# Patient Record
Sex: Male | Born: 1955
Health system: Southern US, Community
[De-identification: ages and names within clinical notes are randomized; demographics above are authoritative.]

## PROBLEM LIST (undated history)

## (undated) DIAGNOSIS — F1011 Alcohol abuse, in remission: Secondary | ICD-10-CM

## (undated) DIAGNOSIS — I219 Acute myocardial infarction, unspecified: Secondary | ICD-10-CM

## (undated) DIAGNOSIS — I10 Essential (primary) hypertension: Secondary | ICD-10-CM

## (undated) DIAGNOSIS — Z992 Dependence on renal dialysis: Secondary | ICD-10-CM

## (undated) DIAGNOSIS — I429 Cardiomyopathy, unspecified: Secondary | ICD-10-CM

## (undated) DIAGNOSIS — I509 Heart failure, unspecified: Secondary | ICD-10-CM

## (undated) DIAGNOSIS — N186 End stage renal disease: Secondary | ICD-10-CM

## (undated) DIAGNOSIS — I499 Cardiac arrhythmia, unspecified: Secondary | ICD-10-CM

## (undated) DIAGNOSIS — I2510 Atherosclerotic heart disease of native coronary artery without angina pectoris: Secondary | ICD-10-CM

## (undated) DIAGNOSIS — I469 Cardiac arrest, cause unspecified: Secondary | ICD-10-CM

## (undated) DIAGNOSIS — I5042 Chronic combined systolic (congestive) and diastolic (congestive) heart failure: Secondary | ICD-10-CM

## (undated) HISTORY — DX: Cardiomyopathy, unspecified: I42.9

## (undated) HISTORY — DX: Chronic combined systolic (congestive) and diastolic (congestive) heart failure: I50.42

## (undated) HISTORY — DX: Alcohol abuse, in remission: F10.11

---

## 1978-10-27 HISTORY — PX: ORTHOPEDIC SURGERY: SHX850

## 1981-10-27 HISTORY — PX: KNEE SURGERY: SHX244

## 1990-10-27 HISTORY — PX: HAND SURGERY: SHX662

## 2006-07-30 ENCOUNTER — Emergency Department: Admit: 2006-07-30 | Payer: Self-pay | Source: Emergency Department | Admitting: Emergency Medicine

## 2006-10-18 ENCOUNTER — Emergency Department: Admit: 2006-10-18 | Payer: Self-pay | Source: Emergency Department

## 2008-10-27 HISTORY — PX: COLONOSCOPY, DIAGNOSTIC (SCREENING): SHX174

## 2011-09-21 ENCOUNTER — Emergency Department (HOSPITAL_COMMUNITY): Payer: BC Managed Care – PPO

## 2011-09-21 ENCOUNTER — Other Ambulatory Visit: Payer: Self-pay

## 2011-09-21 ENCOUNTER — Inpatient Hospital Stay (HOSPITAL_COMMUNITY)
Admission: EM | Admit: 2011-09-21 | Discharge: 2011-09-30 | DRG: 569 | Disposition: A | Discharge status: Home / Self Care | Payer: BC Managed Care – PPO | Source: Ambulatory Visit | Attending: Internal Medicine | Admitting: Internal Medicine

## 2011-09-21 DIAGNOSIS — Z87891 Personal history of nicotine dependence: Secondary | ICD-10-CM

## 2011-09-21 DIAGNOSIS — I161 Hypertensive emergency: Secondary | ICD-10-CM

## 2011-09-21 DIAGNOSIS — F10931 Alcohol use, unspecified with withdrawal delirium: Secondary | ICD-10-CM | POA: Diagnosis not present

## 2011-09-21 DIAGNOSIS — N269 Renal sclerosis, unspecified: Secondary | ICD-10-CM | POA: Diagnosis present

## 2011-09-21 DIAGNOSIS — N289 Disorder of kidney and ureter, unspecified: Secondary | ICD-10-CM

## 2011-09-21 DIAGNOSIS — N179 Acute kidney failure, unspecified: Secondary | ICD-10-CM | POA: Diagnosis present

## 2011-09-21 DIAGNOSIS — N184 Chronic kidney disease, stage 4 (severe): Secondary | ICD-10-CM | POA: Diagnosis present

## 2011-09-21 DIAGNOSIS — I5043 Acute on chronic combined systolic (congestive) and diastolic (congestive) heart failure: Secondary | ICD-10-CM

## 2011-09-21 DIAGNOSIS — I428 Other cardiomyopathies: Secondary | ICD-10-CM | POA: Diagnosis present

## 2011-09-21 DIAGNOSIS — F1020 Alcohol dependence, uncomplicated: Secondary | ICD-10-CM | POA: Diagnosis present

## 2011-09-21 DIAGNOSIS — I129 Hypertensive chronic kidney disease with stage 1 through stage 4 chronic kidney disease, or unspecified chronic kidney disease: Principal | ICD-10-CM | POA: Diagnosis present

## 2011-09-21 DIAGNOSIS — E876 Hypokalemia: Secondary | ICD-10-CM | POA: Diagnosis present

## 2011-09-21 DIAGNOSIS — Z23 Encounter for immunization: Secondary | ICD-10-CM

## 2011-09-21 DIAGNOSIS — Z6832 Body mass index (BMI) 32.0-32.9, adult: Secondary | ICD-10-CM

## 2011-09-21 DIAGNOSIS — F10231 Alcohol dependence with withdrawal delirium: Secondary | ICD-10-CM | POA: Diagnosis not present

## 2011-09-21 DIAGNOSIS — R809 Proteinuria, unspecified: Secondary | ICD-10-CM | POA: Diagnosis present

## 2011-09-21 DIAGNOSIS — I5032 Chronic diastolic (congestive) heart failure: Secondary | ICD-10-CM | POA: Diagnosis present

## 2011-09-21 DIAGNOSIS — I509 Heart failure, unspecified: Secondary | ICD-10-CM | POA: Diagnosis present

## 2011-09-21 HISTORY — DX: Essential (primary) hypertension: I10

## 2011-09-21 LAB — POCT I-STAT, CHEM 8
Calcium, Ion: 1.15 mmol/L (ref 1.12–1.32)
Creatinine, Ser: 4.9 mg/dL — ABNORMAL HIGH (ref 0.50–1.35)
Glucose, Bld: 103 mg/dL — ABNORMAL HIGH (ref 70–99)
HCT: 43 % (ref 39.0–52.0)
Hemoglobin: 14.6 g/dL (ref 13.0–17.0)

## 2011-09-21 LAB — CREATININE, SERUM
Creatinine, Ser: 4.72 mg/dL — ABNORMAL HIGH (ref 0.50–1.35)
GFR calc Af Amer: 14 mL/min — ABNORMAL LOW (ref >90)
GFR calc non Af Amer: 13 mL/min — ABNORMAL LOW (ref >90)

## 2011-09-21 LAB — URINALYSIS, ROUTINE W REFLEX MICROSCOPIC
Ketones, ur: NEGATIVE mg/dL
Nitrite: NEGATIVE
Protein, ur: >300 mg/dL — AB
Specific Gravity, Urine: 1.016 (ref 1.005–1.030)
Urobilinogen, UA: 1 mg/dL (ref 0.0–1.0)

## 2011-09-21 LAB — CBC
HCT: 35.9 % — ABNORMAL LOW (ref 39.0–52.0)
Hemoglobin: 12.5 g/dL — ABNORMAL LOW (ref 13.0–17.0)
MCH: 30.9 pg (ref 26.0–34.0)
MCH: 30.6 pg (ref 26.0–34.0)
MCHC: 34.8 g/dL (ref 30.0–36.0)
MCV: 87.8 fL (ref 78.0–100.0)
Platelets: 240 10*3/uL (ref 150–400)
Platelets: 221 10*3/uL (ref 150–400)
RBC: 4.24 MIL/uL (ref 4.22–5.81)
RBC: 4.09 MIL/uL — ABNORMAL LOW (ref 4.22–5.81)
RDW: 15.8 % — ABNORMAL HIGH (ref 11.5–15.5)
RDW: 15.7 % — ABNORMAL HIGH (ref 11.5–15.5)
WBC: 5.5 10*3/uL (ref 4.0–10.5)

## 2011-09-21 LAB — PRO B NATRIURETIC PEPTIDE: Pro B Natriuretic peptide (BNP): 30315 pg/mL — ABNORMAL HIGH (ref 0–125)

## 2011-09-21 LAB — DIFFERENTIAL
Basophils Absolute: 0.1 10*3/uL (ref 0.0–0.1)
Basophils Relative: 1 % (ref 0–1)
Eosinophils Absolute: 0.2 10*3/uL (ref 0.0–0.7)
Lymphs Abs: 1.5 10*3/uL (ref 0.7–4.0)
Neutrophils Relative %: 60 % (ref 43–77)

## 2011-09-21 LAB — URINE MICROSCOPIC-ADD ON

## 2011-09-21 LAB — MRSA PCR SCREENING: MRSA by PCR: NEGATIVE

## 2011-09-21 LAB — TROPONIN I: Troponin I: <0.3 ng/mL (ref <0.30)

## 2011-09-21 MED ORDER — LABETALOL HCL 5 MG/ML IV SOLN
20.0000 mg | INTRAVENOUS | Status: DC | PRN
  Administered 2011-09-21 – 2011-09-22 (×3): 20 mg via INTRAVENOUS
  Filled 2011-09-21 (×5): qty 4

## 2011-09-21 MED ORDER — FUROSEMIDE 10 MG/ML IJ SOLN
80.0000 mg | Freq: Two times a day (BID) | INTRAMUSCULAR | Status: DC
  Filled 2011-09-21 (×2): qty 8

## 2011-09-21 MED ORDER — INFLUENZA VIRUS VACC SPLIT PF IM SUSP
0.5000 mL | INTRAMUSCULAR | Status: AC
  Administered 2011-09-22: 0.5 mL via INTRAMUSCULAR
  Filled 2011-09-21: qty 0.5

## 2011-09-21 MED ORDER — ONDANSETRON HCL 4 MG/2ML IJ SOLN: 4.0000 mg | Freq: Four times a day (QID) | INTRAMUSCULAR | Status: DC | PRN

## 2011-09-21 MED ORDER — ACETAMINOPHEN 325 MG PO TABS: 650.0000 mg | ORAL_TABLET | ORAL | Status: DC | PRN

## 2011-09-21 MED ORDER — ZOLPIDEM TARTRATE 5 MG PO TABS
10.0000 mg | ORAL_TABLET | Freq: Every evening | ORAL | Status: DC | PRN
  Administered 2011-09-25: 10 mg via ORAL
  Filled 2011-09-21: qty 2

## 2011-09-21 MED ORDER — LABETALOL HCL 5 MG/ML IV SOLN
20.0000 mg | Freq: Once | INTRAVENOUS | Status: AC
  Administered 2011-09-21: 20 mg via INTRAVENOUS
  Filled 2011-09-21: qty 4

## 2011-09-21 MED ORDER — SODIUM CHLORIDE 0.9 % IV SOLN: 250.0000 mL | INTRAVENOUS | Status: DC

## 2011-09-21 MED ORDER — CARVEDILOL 3.125 MG PO TABS
3.1250 mg | ORAL_TABLET | Freq: Two times a day (BID) | ORAL | Status: DC
  Administered 2011-09-21 – 2011-09-22 (×2): 3.125 mg via ORAL
  Filled 2011-09-21 (×4): qty 1

## 2011-09-21 MED ORDER — SODIUM CHLORIDE 0.9 % IJ SOLN
3.0000 mL | Freq: Two times a day (BID) | INTRAMUSCULAR | Status: DC
  Administered 2011-09-21 – 2011-09-27 (×9): 3 mL via INTRAVENOUS

## 2011-09-21 MED ORDER — LABETALOL HCL 5 MG/ML IV SOLN
1.0000 mg/min | INTRAVENOUS | Status: DC
  Filled 2011-09-21: qty 100

## 2011-09-21 MED ORDER — FUROSEMIDE 10 MG/ML IJ SOLN
40.0000 mg | Freq: Once | INTRAMUSCULAR | Status: AC
  Administered 2011-09-21: 40 mg via INTRAVENOUS
  Filled 2011-09-21: qty 4

## 2011-09-21 MED ORDER — NITROGLYCERIN 2 % TD OINT
1.0000 [in_us] | TOPICAL_OINTMENT | Freq: Three times a day (TID) | TRANSDERMAL | Status: AC
  Administered 2011-09-21 – 2011-09-22 (×3): 1 [in_us] via TOPICAL
  Filled 2011-09-21: qty 30

## 2011-09-21 MED ORDER — SODIUM CHLORIDE 0.9 % IJ SOLN: 3.0000 mL | INTRAMUSCULAR | Status: DC | PRN

## 2011-09-21 MED ORDER — ENOXAPARIN SODIUM 30 MG/0.3ML ~~LOC~~ SOLN
30.0000 mg | SUBCUTANEOUS | Status: DC
  Administered 2011-09-21 – 2011-09-29 (×9): 30 mg via SUBCUTANEOUS
  Filled 2011-09-21 (×10): qty 0.3

## 2011-09-21 NOTE — ED Provider Notes (Signed)
History     CSN: [id number] Arrival date & time: 09/21/2011  4:19 PM   First MD Initiated Contact with Patient 09/21/11 1624      Chief Complaint  Patient presents with  . Shortness of Breath  . Hypertension    (Consider location/radiation/quality/duration/timing/severity/associated sxs/prior treatment) Patient is a 55 y.o. male presenting with shortness of breath and hypertension. The history is provided by the patient.  Shortness of Breath  The current episode started more than 2 weeks ago. Associated symptoms include shortness of breath. Pertinent negatives include no chest pain.  Hypertension Associated symptoms include shortness of breath. Pertinent negatives include no chest pain and no abdominal pain.   patient states he's had trouble breathing for the last month or 2. Worse with lying down. No chest pain. No headaches. He has no primary care doctor. He was seen at urgent care in [facility] and was sent here because his blood pressure was 230/180. No numbness or weakness. He has a history of hypertension. He is a former smoker. For shortness of breath is better with sitting up. Recently exertion. He also is swelling in his bilateral lower legs, she states is new.  Past Medical History  Diagnosis Date  . CHF (congestive heart failure)   . Hypertension     History reviewed. No pertinent past surgical history.  History reviewed. No pertinent family history.  History  Substance Use Topics  . Smoking status: Former Research scientist (life sciences)  . Smokeless tobacco: Not on file  . Alcohol Use: Yes     occ      Review of Systems  Constitutional: Positive for fatigue. Negative for appetite change.  HENT: Negative for ear discharge.   Respiratory: Positive for shortness of breath. Negative for chest tightness.   Cardiovascular: Positive for leg swelling. Negative for chest pain.  Gastrointestinal: Negative for abdominal pain.  Genitourinary: Negative for flank pain.  Musculoskeletal: Negative  for back pain.  Neurological: Negative for seizures and numbness.    Allergies  Review of patient's allergies indicates no known allergies.  Home Medications   Current Outpatient Rx  Name Route Sig Dispense Refill  . GUAIFENESIN 600 MG PO TB12 Oral Take 1,200 mg by mouth 2 (two) times daily as needed. For chest congestion     . VICKS DAYQUIL SINEX PO Oral Take 1 capsule by mouth daily as needed. For congesiton       BP 189/125  Pulse 86  Temp(Src) 97.6 F (36.4 C) (Oral)  Resp 26  SpO2 97%  Physical Exam  Nursing note and vitals reviewed. Constitutional: He is oriented to person, place, and time. He appears well-developed and well-nourished.  HENT:  Head: Normocephalic and atraumatic.  Eyes: EOM are normal. Pupils are equal, round, and reactive to light.  Neck: Normal range of motion. Neck supple.  Cardiovascular: Normal rate, regular rhythm and normal heart sounds.   No murmur heard.      Severely hypertensive  Pulmonary/Chest: Effort normal.       Mild wheezes bilateral bases  Abdominal: Soft. Bowel sounds are normal. He exhibits no distension and no mass. There is no tenderness. There is no rebound and no guarding.  Musculoskeletal: Normal range of motion. He exhibits edema.       Bilateral lower extremity pitting edema.  Neurological: He is alert and oriented to person, place, and time. No cranial nerve deficit.  Skin: Skin is warm and dry.  Psychiatric: He has a normal mood and affect.    ED Course  Procedures (including critical care time)  Labs Reviewed  CBC - Abnormal; Notable for the following:    HCT 37.3 (*)    RDW 15.8 (*)    All other components within normal limits  DIFFERENTIAL - Abnormal; Notable for the following:    Monocytes Relative 14 (*)    All other components within normal limits  PRO B NATRIURETIC PEPTIDE - Abnormal; Notable for the following:    BNP, POC 30315.0 (*)    All other components within normal limits  POCT I-STAT, CHEM 8 -  Abnormal; Notable for the following:    BUN 55 (*)    Creatinine, Ser 4.90 (*)    Glucose, Bld 103 (*)    All other components within normal limits  TROPONIN I  I-STAT, CHEM 8  URINALYSIS, ROUTINE W REFLEX MICROSCOPIC   Dg Chest 2 View  09/21/2011  *RADIOLOGY REPORT*  Clinical Data: Short of breath.  Hypertension.  CHF.  CHEST - 2 VIEW  Comparison: None.  Findings: Cardiomegaly.  Interstitial pulmonary edema.  Pulmonary vascular congestion.  Small bilateral pleural effusions.  Thickening of the fissures is present on the lateral view.  IMPRESSION: Mild CHF.  Original Report Authenticated By: Dereck Ligas, M.D.     1. Hypertensive emergency   2. Renal insufficiency   3. CHF (congestive heart failure)      Date: 09/21/2011  Rate: 111  Rhythm: sinus tachycardia  QRS Axis: right  Intervals: normal  ST/T Wave abnormalities: q waves anteriorly  Conduction Disutrbances:none  Narrative Interpretation: repolarization abnormalites  Old EKG Reviewed: none available  CRITICAL CARE Performed by: Mackie Pai   Total critical care time: 30  Critical care time was exclusive of separately billable procedures and treating other patients.  Critical care was necessary to treat or prevent imminent or life-threatening deterioration.  Critical care was time spent personally by me on the following activities: development of treatment plan with patient and/or surrogate as well as nursing, discussions with consultants, evaluation of patient's response to treatment, examination of patient, obtaining history from patient or surrogate, ordering and performing treatments and interventions, ordering and review of laboratory studies, ordering and review of radiographic studies, pulse oximetry and re-evaluation of patient's condition.  MDM  Patient presents with high blood pressure and dyspnea. He has a very elevated blood pressure initially 2:30 or 170. No chest pain. He states he's had trouble  sleeping when lying down. No chest pain. He has new renal failure and new CHF. This makes a hypertensive emergency. He does not appear to need urgent dialysis. He has had improvement with IV labetalol. He'll be admitted to step down unit.      Jasper Riling. Alvino Chapel, MD 09/21/11 1907

## 2011-09-21 NOTE — ED Notes (Signed)
Admission MD at bedside.  

## 2011-09-21 NOTE — ED Notes (Signed)
Patient is resting comfortably. 

## 2011-09-21 NOTE — ED Notes (Signed)
Per ems, pt transferred from Urgent care on [place] pt reporting SOB. BP was 230/170 in bilateral upper extremeties. Denies chest pain, headaches, blurred vision. Equal grips, no drift. Breath sounds clear to auscultation. 12 lead was negative for EMS and urgent care. HR: 114, Sats: 97% on 4L [place].

## 2011-09-21 NOTE — ED Notes (Signed)
Pt has a urinal and knows we need a  Urine sample

## 2011-09-21 NOTE — H&P (Signed)
Chief Complaint: Shortness of breath HPI: Kenneth [name] is an 55 y.o. male with a past medical history significant for hypertension for 15 years that has been untreated presents today with acute shortness of breath.  The patient notes that he has been short of breath for at least a week and it has steadily progressed to SOB at rest worsened with exertion.  The patient has noted that there have been no changes in his urinary frequency and that while he knew he had hypertension for 15 years he has only used bp meds periodically.  He notes no headache but does have PND and orthopnea.  He has had progressive Lower extremity edema bilaterally for the past 2 weeks.  He has had no fevers, chills, night sweats, nasuea, vomiting, diarrhea, chest pain, palpitations but has had the above noted shortness of breath.  He has had no pain with this episode.  In the ED patient has a BNP of 57846 and had 40 mg of IV lasix as well as a 1 time dose of 20 mg of labetalol.  PMH:  Past Medical History  Diagnosis Date  . Hypertension    PSH: History reviewed. No pertinent past surgical history.   FAMILY HX:  Family History  Problem Relation Age of Onset  . Emphysema Mother    SOCIAL HX:  reports that he has quit smoking. He uses smokeless tobacco. He reports that he drinks about 1.8 ounces of alcohol per week. He reports that he does not use illicit drugs.  ALL: No Known Allergies  MEDS:  Medications Prior to Admission  Medication Dose Route Frequency Provider Last Rate Last Dose  . furosemide (LASIX) injection 40 mg  40 mg Intravenous Once [employer]. Pickering, MD   40 mg at 09/21/11 1821  . labetalol (NORMODYNE,TRANDATE) injection 20 mg  20 mg Intravenous Once [employer] Pickering, MD   20 mg at 09/21/11 1819  . DISCONTD: labetalol (NORMODYNE,TRANDATE) 4 mg/mL in dextrose 5 % 125 mL infusion  1 mg/min Intravenous Titrated [name], MD       No current outpatient prescriptions on file as of 09/21/2011.      Review of Systems:  12 point review of system was reviewed and negative with exceptions in the HPI.  Physcial Exam  Blood pressure 181/159, pulse 87, temperature 97.6 F (36.4 C), temperature source Oral, resp. rate 32, SpO2 99.00%. GEN: A+Ox3, NAD HEENT: PERRL, EOMI, MMM, oropharynx clear without erythema or exudates NECK: Supple,JV to angle of mandible at 90 degrees elevation, no Thyromegally, no Lymphadenopathy CV: Normal Rate, Regular Rhythm, Nl S1/S2, +S3, no murmurs, rubs CHEST: Bilateral crackles clearing by the upper lung fields bilaterally ABD: NABS, S/NT/ND, protuberant, no hepatosplenomegally, no masses EXTR: there is 2+ pitting edema to the BLE through the mid back bilaterally. SKIN: no rashes or ulcerations NEURO: CN2-12 intact, no focal sensory or motor deficits noted, MS normal.  LABS:  Results for orders placed during the hospital encounter of 09/21/11 (from the past 48 hour(s))  CBC     Status: Abnormal   Collection Time   09/21/11  3:50 PM      Component Value Range Comment   WBC 6.7  4.0 - 10.5 (K/uL)    RBC 4.24  4.22 - 5.81 (MIL/uL)    Hemoglobin 13.1  13.0 - 17.0 (g/dL)    HCT 37.3 (*) 39.0 - 52.0 (%)    MCV 88.0  78.0 - 100.0 (fL)    MCH 30.9  26.0 -  34.0 (pg)    MCHC 35.1  30.0 - 36.0 (g/dL)    RDW 15.8 (*) 11.5 - 15.5 (%)    Platelets 240  150 - 400 (K/uL)   DIFFERENTIAL     Status: Abnormal   Collection Time   09/21/11  3:50 PM      Component Value Range Comment   Neutrophils Relative 60  43 - 77 (%)    Neutro Abs 4.0  1.7 - 7.7 (K/uL)    Lymphocytes Relative 23  12 - 46 (%)    Lymphs Abs 1.5  0.7 - 4.0 (K/uL)    Monocytes Relative 14 (*) 3 - 12 (%)    Monocytes Absolute 0.9  0.1 - 1.0 (K/uL)    Eosinophils Relative 2  0 - 5 (%)    Eosinophils Absolute 0.2  0.0 - 0.7 (K/uL)    Basophils Relative 1  0 - 1 (%)    Basophils Absolute 0.1  0.0 - 0.1 (K/uL)   PRO B NATRIURETIC PEPTIDE     Status: Abnormal   Collection Time   09/21/11  3:50 PM       Component Value Range Comment   BNP, POC 30315.0 (*) 0 - 125 (pg/mL)   TROPONIN I     Status: Normal   Collection Time   09/21/11  5:00 PM      Component Value Range Comment   Troponin I <0.30  <0.30 (ng/mL)   POCT I-STAT, CHEM 8     Status: Abnormal   Collection Time   09/21/11  5:06 PM      Component Value Range Comment   Sodium 138  135 - 145 (mEq/L)    Potassium 4.1  3.5 - 5.1 (mEq/L)    Chloride 111  96 - 112 (mEq/L)    BUN 55 (*) 6 - 23 (mg/dL)    Creatinine, Ser 4.90 (*) 0.50 - 1.35 (mg/dL)    Glucose, Bld 103 (*) 70 - 99 (mg/dL)    Calcium, Ion 1.15  1.12 - 1.32 (mmol/L)    TCO2 17  0 - 100 (mmol/L)    Hemoglobin 14.6  13.0 - 17.0 (g/dL)    HCT 43.0  39.0 - 52.0 (%)     RADIOLOGY:  Dg Chest 2 View  09/21/2011  *RADIOLOGY REPORT*  Clinical Data: Short of breath.  Hypertension.  CHF.  CHEST - 2 VIEW  Comparison: None.  Findings: Cardiomegaly.  Interstitial pulmonary edema.  Pulmonary vascular congestion.  Small bilateral pleural effusions.  Thickening of the fissures is present on the lateral view.  IMPRESSION: Mild CHF.  Original Report Authenticated By: Dereck Ligas, M.D.    Assessment/Plan 1. Acute Decompensated CHF:  Patient with clear signs of acute decompensated CHF.  Likely diastolic in nature but certainly in this longstanding hypertensive patient could have component of systolic heart failure as well.  I have ordered an echocardiogram for further classification.  Clearly patient has fluid on board and likely this could be a partial cause of his renal failure (see below) and so lasix is in order.  Given his high creatinine he likely needs higher dose of lasix to be effective so wills tart at 80 mg IV BID with a goal net negative of 1 liter per day.  Have started low dose Coreg, however have held ACE/ARB due to renal failure.  I will place a 1 inch of nitro past on the patient for vasodilatory and hypertensive reasons.  Strict I/Os and daily weights. 2. Accelerated  HTN  with Emergency: BP responded well to labetalol x 1 dose will have nitro paste per above and have added low dose beta blocker.  I suspect that labetalol and/or hydralazine will be helpful in this patient and I will have PRN order for these medications.  Would titrate BB as possible upward. 3. Acute renal failure: likely has chronic component at this point.  Have ordered UP:C ratio and have ordered a urine electrolytes and renal ultrasound.  If cannot get patient to urinate, may need acute dialysis in this setting for overload. 4. Fen/ppx: heart failure diet, replete electrolytes as needed, lovenox for dvt prophylaxis and adjusted for renal failure. 5. Code status: FULL CODE  Noris Kulinski W. 09/21/2011, 7:41 PM

## 2011-09-22 ENCOUNTER — Inpatient Hospital Stay (HOSPITAL_COMMUNITY): Payer: BC Managed Care – PPO

## 2011-09-22 DIAGNOSIS — I509 Heart failure, unspecified: Secondary | ICD-10-CM | POA: Diagnosis present

## 2011-09-22 DIAGNOSIS — I161 Hypertensive emergency: Secondary | ICD-10-CM | POA: Diagnosis present

## 2011-09-22 LAB — BASIC METABOLIC PANEL
Chloride: 104 mEq/L (ref 96–112)
GFR calc Af Amer: 14 mL/min — ABNORMAL LOW (ref >90)
GFR calc non Af Amer: 13 mL/min — ABNORMAL LOW (ref >90)
Potassium: 4.1 mEq/L (ref 3.5–5.1)
Sodium: 137 mEq/L (ref 135–145)

## 2011-09-22 LAB — CARDIAC PANEL(CRET KIN+CKTOT+MB+TROPI)
CK, MB: 6.7 ng/mL (ref 0.3–4.0)
CK, MB: 5.1 ng/mL — ABNORMAL HIGH (ref 0.3–4.0)
Relative Index: 3.5 — ABNORMAL HIGH (ref 0.0–2.5)
Total CK: 184 U/L (ref 7–232)
Total CK: 158 U/L (ref 7–232)

## 2011-09-22 LAB — TSH: TSH: 4.947 u[IU]/mL — ABNORMAL HIGH (ref 0.350–4.500)

## 2011-09-22 LAB — SODIUM, URINE, RANDOM: Sodium, Ur: 33 mEq/L

## 2011-09-22 LAB — T3, FREE: T3, Free: 1.6 pg/mL — ABNORMAL LOW (ref 2.3–4.2)

## 2011-09-22 LAB — CREATININE, URINE, RANDOM: Creatinine, Urine: 130.34 mg/dL

## 2011-09-22 LAB — MICROALBUMIN / CREATININE URINE RATIO: Microalb, Ur: 71.8 mg/dL — ABNORMAL HIGH (ref 0.00–1.89)

## 2011-09-22 LAB — T4, FREE: Free T4: 0.85 ng/dL (ref 0.80–1.80)

## 2011-09-22 MED ORDER — CARVEDILOL 12.5 MG PO TABS
12.5000 mg | ORAL_TABLET | Freq: Two times a day (BID) | ORAL | Status: DC
  Administered 2011-09-22 – 2011-09-23 (×3): 12.5 mg via ORAL
  Filled 2011-09-22 (×6): qty 1

## 2011-09-22 MED ORDER — HYDRALAZINE HCL 25 MG PO TABS
25.0000 mg | ORAL_TABLET | ORAL | Status: AC
  Administered 2011-09-22: 25 mg via ORAL
  Filled 2011-09-22: qty 1

## 2011-09-22 MED ORDER — CLONIDINE HCL 0.1 MG PO TABS
0.1000 mg | ORAL_TABLET | Freq: Three times a day (TID) | ORAL | Status: DC
  Administered 2011-09-22 – 2011-09-25 (×7): 0.1 mg via ORAL
  Filled 2011-09-22 (×10): qty 1

## 2011-09-22 MED ORDER — AMLODIPINE BESYLATE 10 MG PO TABS
10.0000 mg | ORAL_TABLET | Freq: Every day | ORAL | Status: DC
  Administered 2011-09-22 – 2011-09-26 (×5): 10 mg via ORAL
  Filled 2011-09-22 (×6): qty 1

## 2011-09-22 MED ORDER — HYDRALAZINE HCL 20 MG/ML IJ SOLN
10.0000 mg | Freq: Three times a day (TID) | INTRAMUSCULAR | Status: DC | PRN
  Filled 2011-09-22: qty 0.5

## 2011-09-22 MED ORDER — HYDRALAZINE HCL 25 MG PO TABS
25.0000 mg | ORAL_TABLET | Freq: Three times a day (TID) | ORAL | Status: DC
  Administered 2011-09-22 – 2011-09-25 (×8): 25 mg via ORAL
  Filled 2011-09-22 (×12): qty 1

## 2011-09-22 MED ORDER — CLONIDINE HCL 0.1 MG PO TABS
0.1000 mg | ORAL_TABLET | Freq: Two times a day (BID) | ORAL | Status: DC
  Administered 2011-09-22: 0.1 mg via ORAL
  Filled 2011-09-22 (×2): qty 1

## 2011-09-22 MED ORDER — FUROSEMIDE 10 MG/ML IJ SOLN
160.0000 mg | Freq: Four times a day (QID) | INTRAVENOUS | Status: DC
  Administered 2011-09-22 – 2011-09-24 (×7): 160 mg via INTRAVENOUS
  Filled 2011-09-22 (×9): qty 16

## 2011-09-22 MED ORDER — METOLAZONE 5 MG PO TABS
5.0000 mg | ORAL_TABLET | Freq: Every day | ORAL | Status: DC
  Administered 2011-09-22 – 2011-09-24 (×3): 5 mg via ORAL
  Filled 2011-09-22 (×3): qty 1

## 2011-09-22 NOTE — Progress Notes (Signed)
Utilization review completed.  

## 2011-09-22 NOTE — Progress Notes (Signed)
  Echocardiogram 2D Echocardiogram has been performed.  Nyra [place], RDCS 09/22/2011, 12:08 PM

## 2011-09-22 NOTE — Plan of Care (Signed)
Problem: Phase I Progression Outcomes Goal: EF % per last Echo/documented,Core Reminder form on chart Outcome: Progressing Awaiting 2D echco  Goal: Hemodynamically stable Outcome: Not Progressing BP still is elevated

## 2011-09-22 NOTE — Consult Note (Addendum)
Kenneth [name] is a 55 year old black man admitted  last night with DOB, orthopnea, and edema. He has known of hypertension for at least 15 years but does not take medicines regularly. He and his wife have recently moved to [place] from [facility]. He does not have a primary care physician creatinine was 4.7 on admission and renal consult was requested by Dr. Maryland Pink.  Past medical history: Hypertension (untreated) new  Meds prior to admission: None on a regular basis  Current meds: Amlodipine 10/D. carvedilol 12.5 twice a day clonidine 0.1 3 times a day Lovenox 30/D. furosemide 160 every 6 hours IV, hydralazine 25 3 times a day, metolazone 5 mg/d  Allergies: None known   family history: Father died age 84 of "cirrhosis." Mother died of "emphysema." He has 9 siblings several have high blood pressure and none have renal disease that he knows of. He has 3 children 2 daughters and a son who are healthy.  Social history: he was born in [place], [place] he graduated from high school and attended [employer] in health and physical education and states he played football there.) he has 30-pack-year history of cigarette smoking quit 15 years ago. he drinks 3-6 beers per day. he lives with his wife they have been married for 29 years. His wife recently moved here from [place], [place]: He has no angina, no claudication, no melena, no hematochezia, no gross hematuria no renal colic, no purulent sputum, no hemoptysis, no cold or heat intolerance. He does have DOE, orthopnea, and edema  Physical exam: Is awake and alert. Daughters are at the bedside Temperature 98.1 pulse 80, respirations 20, blood pressure 170/110 Nose mouth and pharynx: Teeth in fair shape, no inflammation or exudate Chest: Decreased breath sounds in bases Heart: Regular rate and rhythm, no rub is heard Abdomen: Nontender, no organs or masses are felt, bowel sounds are  present, no bruits are heard GU: Uncircumcised penis, testes are descended bilaterally Extremities: 2+ pretibial edema, no arthritis, no rash, no atheroembolic changes on toes Neuro: Right-handed, strength equal, sensation intact  Lab: Hemoglobin 12.5, WBC 5500, platelets 221K; urinalysis SG 1.016, pH 5 glucose, ketones, blood negative, 3+ protein 0-2 WBC, 0-2 RBC, occasional granular casts Sodium 137, potassium 4.1, chloride 104, CO2 18, BUN 58, CR 4.7, calcium 9.1 Renal ultrasound right kidney 10.3 skin left kidney 9.8 CM increased echogenicity no hydronephrosis noted  Impression:  1. High BP 2. CKD 4-5 3. Proteinuria 4. Fluid overload/pleural effusions/edema  Plan: 1. Continue with amlodipine, carvedilol, clonidine. Would avoid ACE inhibitor/ARBs for now. Diuresis should        also help to get blood pressure under better control 2. SPEP and UPEP have been ordered. We'll check 24-hour urine for protein and creatinine, PTH, and renal profile.      No IVs no needlesticks a left forearm-save for vascular access 3. 24 urine for protein creatinine, SPEP/UPEP (as above) 4. Furosemide as ordered above plus metolazone.  Unfortunately I think the "cow is out of the barn." I suspect that long-standing hypertension has led to CKD 4-5 and  control of blood pressure may worsen renal function. I hope I am wrong. in the meantime will search for reversible causes of chronic kidney disease. We'll need to stress the importance of good blood pressure control to avoid endorgan damage to the brain and heart  (in addition to kidneys).

## 2011-09-22 NOTE — Progress Notes (Signed)
CRITICAL VALUE ALERT  Critical value received:  CK-MB 6.7  Date of notification:  [id number]  Time of notification:  0100  Critical value read back:yes  Nurse who received alert:  [place] Carlena Ruybal, RN  MD notified (1st page):  Kathline Magic  Time of first page:  [phone]  MD notified (2nd page):  Time of second page:  Responding MD:  Kathline Magic  Time MD responded:  [id number]  No new orders received. Will continue to monitor.

## 2011-09-22 NOTE — Consult Note (Signed)
Cardiology Consult Note   Patient ID: Kenneth [name] MRN: [id number], DOB/AGE: 57/13/1957   Admit date: 09/21/2011 Date of Consult: 09/22/2011  Primary Physician: No primary provider on file. Primary Cardiologist: None  Pt. Profile: Kenneth [name] is a 55 yo AA male with PMHx significant for HTN (15 years, previously on medications of which he cannot recall, currently not on a medication regimen) and no known past medical history (per patient) admitted to [facility] ED with hypertensive emergency with end-organ-damage in acute decompensated CHF and ARF. Cardiology is consulted for CHF management.   Problem List: Past Medical History  Diagnosis Date  . Hypertension     History reviewed. No pertinent past surgical history.   Allergies: No Known Allergies  HPI:   He reports a 1 month history of worsening shortness of breath. Over the past week, he endorses associated orthopnea, PND, lower extremity edema and nocturia, prompting him to present to the ED. While in the ED, his BP was elevated at 254/163, CXR revealed cardiomegaly, interstitial pulmonary edema with pulmonary vascular congestion consistent with mild CHF, and BUN:Cr = 55/4.90. POC TnI neg., CEs neg. X 2.   He was diagnosed with hypertensive emergency with subsequent end-organ-damage in decompensated CHF and ARF and admitted. He is on multiple antihypertensives with readings at 163-206/101-135 today. He is on Lasix IV and net - 141.   He states that he feels better today with improvement in SOB. He has been on BP meds in the past, but no longer takes them. He does not see a PCP regularly. He reports experiencing tinnitus when his BP is high. He denies cp, palpitations, n/v, diaphoresis, lightheadedness or abdominal pain today or in the last month.   Inpatient Medications:    . amLODipine  10 mg Oral Daily  . carvedilol  12.5 mg Oral BID WC  . cloNIDine  0.1 mg Oral BID  . enoxaparin  30 mg Subcutaneous Q24H  . furosemide  160 mg  Intravenous Q6H  . furosemide  40 mg Intravenous Once  . hydrALAZINE  25 mg Oral STAT  . hydrALAZINE  25 mg Oral Q8H  . influenza  inactive virus vaccine  0.5 mL Intramuscular Tomorrow-1000  . labetalol  20 mg Intravenous Once  . metolazone  5 mg Oral Daily  . nitroGLYCERIN  1 inch Topical Q8H  . sodium chloride  3 mL Intravenous Q12H  . DISCONTD: carvedilol  3.125 mg Oral BID WC  . DISCONTD: furosemide  80 mg Intravenous BID    Family History  Problem Relation Age of Onset  . Emphysema Mother   . Cirrhosis Father      History   Social History  . Marital Status: Married    Spouse Name: N/A    Number of Children: N/A  . Years of Education: N/A   Occupational History  . Not on file.   Social History Main Topics  . Smoking status: Former Research scientist (life sciences)  . Smokeless tobacco: Current User  . Alcohol Use: 1.8 oz/week    3 Cans of beer per week     occ  . Drug Use: No  . Sexually Active:    Other Topics Concern  . Not on file   Social History Narrative  . No narrative on file     Review of Systems: General: negative for chills, fever, night sweats  Cardiovascular: negative for chest pain, palpitations positive for dyspnea on exertion, edema, orthopnea, paroxysmal nocturnal dyspnea and shortness of breath Dermatological: negative for rash  Respiratory: negative for cough, positive for wheezing Urologic: negative for hematuria Abdominal: negative for nausea, vomiting, diarrhea, bright red blood per rectum, melena, or hematemesis Neurologic: negative for visual changes, syncope, or dizziness HEENT: positive for tinnitus All other systems reviewed and are otherwise negative except as noted above.  Physical Exam: Blood pressure 187/115, pulse 81, temperature 97.2 F (36.2 C), temperature source Oral, resp. rate 20, height 6' (1.829 m), weight 109.3 kg (240 lb 15.4 oz), SpO2 95.00%.   General: Speaking in 5 word sentences, visible increased respiratory effort, NAD Head:  Normocephalic, atraumatic, sclera non-icteric  Neck: Negative for carotid bruits. JVD elevated to mid-neck.  Lungs: Rales noted basilar and central lung fields, wheezes on expiration Heart: RRR with S1 S2. No murmurs, rubs, or gallops appreciated. Abdomen: Soft, non-tender, mildly distended with normoactive bowel sounds. No hepatomegaly. No rebound/guarding. No obvious abdominal masses. Msk:  Strength and tone appears normal for age. Extremities: 1+ pitting edema to mid-leg, no clubbing, cyanosis.  Distal pedal pulses are 1+ and equal bilaterally. Neuro: Alert and oriented X 3. Moves all extremities spontaneously. Pt reports decreased sensation in bilateral feet. Psych:  Responds to questions appropriately with a normal affect.  Labs:   Lab Results  Component Value Date   WBC 5.5 09/21/2011   HGB 12.5* 09/21/2011   HCT 35.9* 09/21/2011   MCV 87.8 09/21/2011   PLT 221 09/21/2011    Lab 09/22/11 0420  NA 137  K 4.1  CL 104  CO2 18*  BUN 58*  CREATININE 4.72*  CALCIUM 9.1  PROT --  BILITOT --  ALKPHOS --  ALT --  AST --  GLUCOSE 85   Results for Kenneth [name], Kenneth [name] (MRN [id number]) as of 09/22/2011 13:20  Ref. Range 09/21/2011 17:06 09/21/2011 21:49 09/22/2011 04:20  BUN Latest Range: 6-23 mg/dL 55 (H)  58 (H)  Creat Latest Range: 0.50-1.35 mg/dL 4.90 (H) 4.72 (H) 4.72 (H)    Results for Kenneth [name], Kenneth [name] (MRN [id number]) as of 09/22/2011 13:20  Ref. Range 09/21/2011 17:00 09/21/2011 23:55 09/22/2011 10:19  CK, MB Latest Range: 0.3-4.0 ng/mL  6.7 (HH) 5.2 (H)  CK Total Latest Range: 7-232 U/L  184 150  Troponin I Latest Range: <0.30 ng/mL <0.30 <0.30 <0.30   09/21/2011  *RADIOLOGY REPORT*  Clinical Data: Short of breath.  Hypertension.  CHF.  CHEST - 2 VIEW  Comparison: None.  Findings: Cardiomegaly.  Interstitial pulmonary edema.  Pulmonary vascular congestion.  Small bilateral pleural effusions.  Thickening of the fissures is present on the lateral view.  IMPRESSION: Mild CHF.   Original Report Authenticated By: Dereck Ligas, M.D.   US Renal  09/22/2011  *RADIOLOGY REPORT*  Clinical Data: Renal failure.  RENAL/URINARY TRACT ULTRASOUND COMPLETE  Comparison:  None.  Findings:  Right Kidney:  10.3 cm.  Increased echotexture.  No focal abnormality or hydronephrosis.  Left Kidney:  9.8 cm.  Increased echotexture.  No focal abnormality or hydronephrosis.  Bladder:  Normal.  Incidentally noted are bilateral pleural effusions and a small amount of perisplenic ascites.  IMPRESSION: Increased echotexture within the kidneys suggesting chronic medical renal disease.  No hydronephrosis.  Bilateral effusions, small perisplenic ascites.  Original Report Authenticated By: Raelyn Number, M.D.    EKG: 11/16, NSR, 77 bpm, TWI V5, V6, borderline prolonged QT  ASSESSMENT AND PLAN:   1. Hypertensive Emergency- pt presented to the ED with BP of 254/163 with subsequent end organ damage listed below.   A. Acute decompensated CHF- BNP in ED was 30315, fluid  overloaded on exam, ECHO pending, will await results. I/O - 141, far from goal of -1000/day desired per H&P. Continue to diurese with Lasix IV, continue BP control to range of 160/100  B. Acute renal failure- Cr in ED was 4.90; trending down today at 4.72; however BUN elevating to 58. B/L renal ultrasound concerning for CKD. He very well may be discharged with this diagnosis. GFR today is 14. He may warrant dialysis especially if diuresis is inadequate.   Signed, Danella Sensing , PA-C 09/22/2011, 1:41 PM    I have taken a history, reviewed medications, allergies, PMH, SH, FH, and reviewed ROS and examined the patient.  I agree with the assessment and plan.  I am concerned that he has significant multi-end organ damage from severe untreated HTN. Will increase clonidine to q 8 hrs, check ECHO and continue diuresis. Consider Renal consultation now rather than later.   Demeisha Geraghty C. Verl Blalock, MD, [place] Pager:  [phone]

## 2011-09-22 NOTE — Progress Notes (Signed)
Subjective: Patient seen in the step down unit. He actually states he is not feeling too bad. He states his breathing is better than when he first came in. Denies any chest pain. Feels her tired. 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 Denies any knowledge of any previous medical problems.  Objective: Weight change:   Intake/Output Summary (Last 24 hours) at 09/22/11 1606 Last data filed at 09/22/11 1500  Gross per 24 hour  Intake    484 ml  Output   1025 ml  Net   -541 ml   BP 187/115  Pulse 81  Temp(Src) 97.2 F (36.2 C) (Oral)  Resp 20  Ht 6' (1.829 m)  Wt 109.3 kg (240 lb 15.4 oz)  BMI 32.68 kg/m2  SpO2 95% General appearance: alert, cooperative, appears stated age, fatigued, no distress and moderately obese Head: Normocephalic, without obvious abnormality, atraumatic, Mucous membranes are dry Lungs: Bilateral rails, with scattered wheezes Heart: Regular rate and rhythm, S1, S2, soft 2/6 systolic ejection murmur Abdomen: Soft, nontender, mild obese, hypoactive bowel sounds Extremities: 1+ pitting edema from bilateral knees down Skin: 1+ pulses  Lab Results: Basic Metabolic Panel:  Basename 09/22/11 0420 09/21/11 2149 09/21/11 1706  NA 137 -- 138  K 4.1 -- 4.1  CL 104 -- 111  CO2 18* -- --  GLUCOSE 85 -- 103*  BUN 58* -- 55*  CREATININE 4.72* 4.72* --  CALCIUM 9.1 -- --  MG -- 2.2 --  PHOS -- -- --   CBC:  Basename 09/21/11 2149 09/21/11 1706 09/21/11 1550  WBC 5.5 -- 6.7  NEUTROABS -- -- 4.0  HGB 12.5* 14.6 --  HCT 35.9* 43.0 --  MCV 87.8 -- 88.0  PLT 221 -- 240   Cardiac Enzymes:  Basename 09/22/11 1019 09/21/11 2355 09/21/11 1700  CKTOTAL 150 184 --  CKMB 5.2* 6.7* --  CKMBINDEX -- -- --  TROPONINI <0.30 <0.30 <0.30   BNP:  Basename 09/21/11 1550  POCBNP 30315.0*   Thyroid Function Tests:  [facility] 09/21/11 2149  TSH 4.947*  T4TOTAL --  FREET4 --  T3FREE --  THYROIDAB --     Studies/Results: Dg Chest 2  View 09/21/2011    IMPRESSION: Mild CHF.    US Renal 09/22/2011 IMPRESSION: Increased echotexture within the kidneys suggesting chronic medical renal disease.  No hydronephrosis.  Bilateral effusions, small perisplenic ascites.    Medications: Scheduled Meds:   . amLODipine  10 mg Oral Daily  . carvedilol  12.5 mg Oral BID WC  . cloNIDine  0.1 mg Oral TID  . enoxaparin  30 mg Subcutaneous Q24H  . furosemide  160 mg Intravenous Q6H  . furosemide  40 mg Intravenous Once  . hydrALAZINE  25 mg Oral STAT  . hydrALAZINE  25 mg Oral Q8H  . influenza  inactive virus vaccine  0.5 mL Intramuscular Tomorrow-1000  . labetalol  20 mg Intravenous Once  . metolazone  5 mg Oral Daily  . nitroGLYCERIN  1 inch Topical Q8H  . sodium chloride  3 mL Intravenous Q12H  . DISCONTD: carvedilol  3.125 mg Oral BID WC  . DISCONTD: cloNIDine  0.1 mg Oral BID  . DISCONTD: furosemide  80 mg Intravenous BID   Continuous Infusions:   . sodium chloride    . DISCONTD: labetalol (NORMODYNE) infusion     PRN Meds:.acetaminophen, hydrALAZINE, labetalol, ondansetron (ZOFRAN) IV, sodium chloride, zolpidem  Assessment/Plan: Patient Active Hospital Problem List: Hypertensive emergency (09/22/2011)  I appreciate nephrology and cardiology help. Currently on Norvasc plus Coreg plus clonidine plus hydralazine, plus diuretics plus topical nitroglycerin. We'll continue to follow.  Acute CHF (09/22/2011)  echo is pending. As per recommendations of greatly increased his IV Lasix. Regardless, he still has very minimal urine output. I suspect he may need dialysis. Await nephrology formal evaluation.   ARF (acute renal failure) (09/22/2011)  see above.  Obesity, morbid (09/22/2011)  counseled.   LOS: 1 day   Champ Keetch K 09/22/2011, 4:06 PM

## 2011-09-23 DIAGNOSIS — I10 Essential (primary) hypertension: Secondary | ICD-10-CM

## 2011-09-23 LAB — CBC
HCT: 34.1 % — ABNORMAL LOW (ref 39.0–52.0)
MCH: 30.1 pg (ref 26.0–34.0)
MCV: 88.6 fL (ref 78.0–100.0)
RDW: 16.1 % — ABNORMAL HIGH (ref 11.5–15.5)
WBC: 5.5 10*3/uL (ref 4.0–10.5)

## 2011-09-23 LAB — RENAL FUNCTION PANEL
CO2: 25 mEq/L (ref 19–32)
Chloride: 97 mEq/L (ref 96–112)
GFR calc Af Amer: 14 mL/min — ABNORMAL LOW (ref >90)
Glucose, Bld: 128 mg/dL — ABNORMAL HIGH (ref 70–99)
Phosphorus: 4.6 mg/dL (ref 2.3–4.6)
Potassium: 3.1 mEq/L — ABNORMAL LOW (ref 3.5–5.1)
Sodium: 135 mEq/L (ref 135–145)

## 2011-09-23 LAB — PARATHYROID HORMONE, INTACT (NO CA): PTH: 264.8 pg/mL — ABNORMAL HIGH (ref 14.0–72.0)

## 2011-09-23 NOTE — Progress Notes (Signed)
Patient transferred to 4742, report called to RN on 4700 and all questions answered. Patient stated that he would let family know of new room number. Patients VSS, and he transferred via wheelchair with NT.

## 2011-09-23 NOTE — Progress Notes (Signed)
Patient ID: Kenneth [name], male   DOB: Mar 05, 1956, 56 y.o.   MRN: [id number] SUBJECTIVE: Still coughing and a little SOB. No CP. Increased diuresis, evaluated by NEPHROLOGY,  Filed Vitals:   09/22/11 1600 09/22/11 1900 09/23/11 0000 09/23/11 0400  BP: 173/118 133/83 144/80 151/93  Pulse: 80 77 78 76  Temp: 98.1 F (36.7 C) 97.7 F (36.5 C) 97.8 F (36.6 C) 97.5 F (36.4 C)  TempSrc: Oral Oral Oral Oral  Resp: 21 21 19 21   Height:      Weight:    105.8 kg (233 lb 4 oz)  SpO2: 95% 98% 96% 96%    Intake/Output Summary (Last 24 hours) at 09/23/11 0808 Last data filed at 09/23/11 0400  Gross per 24 hour  Intake    241 ml  Output   3075 ml  Net  -2834 ml    LABS: Basic Metabolic Panel:  Basename 09/22/11 0420 09/21/11 2149 09/21/11 1706  NA 137 -- 138  K 4.1 -- 4.1  CL 104 -- 111  CO2 18* -- --  GLUCOSE 85 -- 103*  BUN 58* -- 55*  CREATININE 4.72* 4.72* --  CALCIUM 9.1 -- --  MG -- 2.2 --  PHOS -- -- --   Liver Function Tests: No results found for this basename: AST:2,ALT:2,ALKPHOS:2,BILITOT:2,PROT:2,ALBUMIN:2 in the last 72 hours No results found for this basename: LIPASE:2,AMYLASE:2 in the last 72 hours CBC:  Basename 09/21/11 2149 09/21/11 1706 09/21/11 1550  WBC 5.5 -- 6.7  NEUTROABS -- -- 4.0  HGB 12.5* 14.6 --  HCT 35.9* 43.0 --  MCV 87.8 -- 88.0  PLT 221 -- 240   Cardiac Enzymes:  Basename 09/22/11 1601 09/22/11 1019 09/21/11 2355  CKTOTAL 158 150 184  CKMB 5.1* 5.2* 6.7*  CKMBINDEX -- -- --  TROPONINI <0.30 <0.30 <0.30   BNP:  Basename 09/21/11 1550  POCBNP 30315.0*   D-Dimer: No results found for this basename: DDIMER:2 in the last 72 hours Hemoglobin A1C: No results found for this basename: HGBA1C in the last 72 hours Fasting Lipid Panel: No results found for this basename: CHOL,HDL,LDLCALC,TRIG,CHOLHDL,LDLDIRECT in the last 72 hours Thyroid Function Tests:  Basename 09/22/11 1807 09/21/11 2149  TSH -- 4.947*  T4TOTAL -- --  T3FREE 1.6*  --  THYROIDAB -- --   Anemia Panel: No results found for this basename: VITAMINB12,FOLATE,FERRITIN,TIBC,IRON,RETICCTPCT in the last 72 hours  RADIOLOGY: Dg Chest 2 View  09/21/2011  *RADIOLOGY REPORT*  Clinical Data: Short of breath.  Hypertension.  CHF.  CHEST - 2 VIEW  Comparison: None.  Findings: Cardiomegaly.  Interstitial pulmonary edema.  Pulmonary vascular congestion.  Small bilateral pleural effusions.  Thickening of the fissures is present on the lateral view.  IMPRESSION: Mild CHF.  Original Report Authenticated By: Dereck Ligas, M.D.   US Renal  09/22/2011  *RADIOLOGY REPORT*  Clinical Data: Renal failure.  RENAL/URINARY TRACT ULTRASOUND COMPLETE  Comparison:  None.  Findings:  Right Kidney:  10.3 cm.  Increased echotexture.  No focal abnormality or hydronephrosis.  Left Kidney:  9.8 cm.  Increased echotexture.  No focal abnormality or hydronephrosis.  Bladder:  Normal.  Incidentally noted are bilateral pleural effusions and a small amount of perisplenic ascites.  IMPRESSION: Increased echotexture within the kidneys suggesting chronic medical renal disease.  No hydronephrosis.  Bilateral effusions, small perisplenic ascites.  Original Report Authenticated By: Raelyn Number, M.D.    PHYSICAL EXAM General: Well developed, well nourished, in no acute distress Head: Eyes PERRLA, No xanthomas.  Normal cephalic and atramatic  Lungs: DECREASED BS BIBASALLY. Heart: HRRR S1 S2, with soft S4 murmur.  Pulses are 2+ & equal.            No carotid bruit. No JVD.  No abdominal bruits. No femoral bruits. Abdomen: Bowel sounds are positive, abdomen soft and non-tender without masses or                  Hernia's noted. Msk:  Back normal, normal gait. Normal strength and tone for age. Extremities: No clubbing, cyanosis , 3+ edema  DP +1 Neuro: Alert and oriented X 3. Psych:  Good affect, responds appropriately  TELEMETRY: Reviewed telemetry pt in NSR  ASSESSMENT AND PLAN:  Principal  Problem:  *Hypertensive emergency Active Problems:  ARF (acute renal failure)  Acute CHF  Obesity, morbid   His BP and Urinary output improved. His ECHO shows systolic dysfunction with moderate LVH, severe LAE, mild MR and moderate posterior pericardial effusion.   No new recommendations today from cardiac perspective. Diuresis per Renal. Increase Carvedilol to 25 mg bid before discharge. Jenell Milliner, MD 09/23/2011 8:08 AM

## 2011-09-23 NOTE — Progress Notes (Signed)
Subjective: Patient states he's feeling a little bit better. His breathing is easier. No chest pain. Seen in the step down unit. The last 24 hours, he has responded somewhat to diuresis.  Objective: Weight change: -2.1 kg (-4 lb 10.1 oz)  Intake/Output Summary (Last 24 hours) at 09/23/11 1743 Last data filed at 09/23/11 1654  Gross per 24 hour  Intake   1100 ml  Output   6975 ml  Net  -5875 ml   BP 132/87  Pulse 77  Temp(Src) 97.6 F (36.4 C) (Oral)  Resp 21  Ht 6' (1.829 m)  Wt 105.8 kg (233 lb 4 oz)  BMI 31.63 kg/m2  SpO2 94% General appearance: alert, cooperative, appears stated age, fatigued, no distress and moderately obese Head: Normocephalic, without obvious abnormality, atraumatic, Mucous membranes are dry Lungs: Bilateral rails, with scattered wheezes, this is improved from the previous day. Her Heart: Regular rate and rhythm, S1, S2, soft 2/6 systolic ejection murmur Abdomen: Soft, nontender, mild obese, hypoactive bowel sounds Extremities: 1+ pitting edema from bilateral knees down Skin: 1+ pulses  Lab Results: Basic Metabolic Panel:  Basename 09/23/11 1423 09/22/11 0420 09/21/11 2149  NA 135 137 --  K 3.1* 4.1 --  CL 97 104 --  CO2 25 18* --  GLUCOSE 128* 85 --  BUN 61* 58* --  CREATININE 4.76* 4.72* --  CALCIUM 8.8 9.1 --  MG -- -- 2.2  PHOS 4.6 -- --   CBC:  Basename 09/23/11 1423 09/21/11 2149 09/21/11 1550  WBC 5.5 5.5 --  NEUTROABS -- -- 4.0  HGB 11.6* 12.5* --  HCT 34.1* 35.9* --  MCV 88.6 87.8 --  PLT 207 221 --   Cardiac Enzymes:  Basename 09/22/11 1601 09/22/11 1019 09/21/11 2355  CKTOTAL 158 150 184  CKMB 5.1* 5.2* 6.7*  CKMBINDEX -- -- --  TROPONINI <0.30 <0.30 <0.30   BNP:  Basename 09/21/11 1550  POCBNP 30315.0*   Thyroid Function Tests:  Basename 09/22/11 1807 09/21/11 2149  TSH -- 4.947*  T4TOTAL -- --  FREET4 0.85 --  T3FREE 1.6* --  THYROIDAB -- --     Studies/Results: Echocardiogram:moderate LVH.  Systolic function was moderately to severely reduced. The estimated ejection fraction was in the range of 30% to 35%. Doppler parameters are consistent with a reversible restrictive pattern, indicative of decreased left ventricular diastolic compliance and/or increased left atrial pressure (grade 3 diastolic dysfunction).    Medications: Scheduled Meds:    . amLODipine  10 mg Oral Daily  . carvedilol  12.5 mg Oral BID WC  . cloNIDine  0.1 mg Oral TID  . enoxaparin  30 mg Subcutaneous Q24H  . furosemide  160 mg Intravenous Q6H  . hydrALAZINE  25 mg Oral Q8H  . metolazone  5 mg Oral Daily  . sodium chloride  3 mL Intravenous Q12H   Continuous Infusions:    . sodium chloride     PRN Meds:.acetaminophen, hydrALAZINE, labetalol, ondansetron (ZOFRAN) IV, sodium chloride, zolpidem  Assessment/Plan: Patient Active Hospital Problem List:  Patient is a 55 year old Afro-American male with no past medical history only because he has not seen a primary care physician ever. He came in complaining of acute shortness of breath and was found to have acute congestive heart failure, severe renal dysfunction which appears to be more chronic than acute with a creatinine of 4.7 and hypertensive urgency with a systolic blood pressure greater than 200. Patient was initially admitted and placed in the step down unit. He started  aggressive diuresis and blood pressure control. A 2-D echo was ordered and nephrology and cardiology were consulted. Over the last 24 hours, patient started to respond to diuresis and is being moved out of the step down unit. It will be as important long-term for his outpatient followup as it is in the acute care setting now.  Hypertensive emergency (09/22/2011)  I appreciate nephrology and cardiology help. His antihypertensive medications were titrated. He seems to be responding to IV Lasix he, although at this point I feel we are trying to prevent end organ damage.  Long-term, he  will need close followup with cardiology.  Acute CHF (09/22/2011)  above echo results are noted. Prior to discharge, maximize Coreg to 25 twice a day.   ARF (acute renal failure) (09/22/2011)  watching closely. Left arm save for possible hemodialysis, perhaps during this hospitalization. Appreciate nephrology help. Starting to diurese.  Obesity, morbid (09/22/2011)  counseled.   LOS: 2 days   Annita Brod 09/23/2011, 5:43 PM

## 2011-09-23 NOTE — Progress Notes (Signed)
Subjective: Awake, alert, a little "down" now that he knows he has lost a good portion of his renal function  Objective: Vital signs in last 24 hours: Blood pressure 141/87, pulse 77, temperature 97.9 F (36.6 C), temperature source Oral, resp. rate 17, height 6' (1.829 m), weight 105.8 kg (233 lb 4 oz), SpO2 96.00%. BP Better  Intake/Output from previous day: 11/26 0701 - 11/27 0700 In: 241 [P.O.:241] Out: 3075 [Urine:3075] Intake/Output this shift: Total I/O In: 550 [P.O.:500; IV Piggyback:50] Out: 1825 [Urine:1825] In neg fluid balance with IV furosemide +, metolazone PHYSICAL EXAM General--awake alert Chest--clear Heart--no rub Abd--nontender Extr--2+ pretib edema  Lab Results:   Lab 09/22/11 0420 09/21/11 2149 09/21/11 1706  NA 137 -- 138  K 4.1 -- 4.1  CL 104 -- 111  CO2 18* -- --  BUN 58* -- 55*  CREATININE 4.72* 4.72* 4.90*  EGFR -- -- --  GLUCOSE 85 -- --  CALCIUM 9.1 -- --  PHOS -- -- --      Basename 09/21/11 2149 09/21/11 1706 09/21/11 1550  WBC 5.5 -- 6.7  HGB 12.5* 14.6 --  HCT 35.9* 43.0 --  PLT 221 -- 240    Scheduled: Continuous:   . sodium chloride      Assessment/Plan: 1. High BP  2. CKD 4-5  3. Proteinuria  4. Fluid overload/pleural effusions/edema   Plan:  1. Continue with amlodipine, carvedilol, clonidine. Would avoid ACE inhibitor/ARBs for now. Diuresis is helping to get blood pressure under      better control  2. SPEP and UPEP have been ordered. We'll check 24-hour urine for protein and creatinine, PTH, and renal profile--all pending. Cr has not      worsened yet      No IVs no needlesticks a left forearm-save for vascular access  3. 24 urine for protein creatinine, SPEP/UPEP (as above)  4. Furosemide as ordered above plus metolazone.Will need to decrease IV furosemide once most of edema gone     LOS: 2 days   Kenneth [name] F 09/23/2011,11:23 AM

## 2011-09-24 DIAGNOSIS — N19 Unspecified kidney failure: Secondary | ICD-10-CM

## 2011-09-24 DIAGNOSIS — N184 Chronic kidney disease, stage 4 (severe): Secondary | ICD-10-CM | POA: Diagnosis present

## 2011-09-24 DIAGNOSIS — I5032 Chronic diastolic (congestive) heart failure: Secondary | ICD-10-CM | POA: Diagnosis present

## 2011-09-24 LAB — CBC
HCT: 34.6 % — ABNORMAL LOW (ref 39.0–52.0)
Hemoglobin: 11.9 g/dL — ABNORMAL LOW (ref 13.0–17.0)
MCH: 30.4 pg (ref 26.0–34.0)
MCHC: 34.4 g/dL (ref 30.0–36.0)

## 2011-09-24 LAB — PROTEIN ELECTROPHORESIS, SERUM
Alpha-2-Globulin: 8.4 % (ref 7.1–11.8)
Beta Globulin: 5.2 % (ref 4.7–7.2)
Gamma Globulin: 26.3 % — ABNORMAL HIGH (ref 11.1–18.8)
M-Spike, %: NOT DETECTED g/dL

## 2011-09-24 LAB — CREATININE, URINE, 24 HOUR
Collection Interval-UCRE24: 24 hours
Urine Total Volume-UCRE24: 9200 mL

## 2011-09-24 LAB — BASIC METABOLIC PANEL
BUN: 62 mg/dL — ABNORMAL HIGH (ref 6–23)
GFR calc non Af Amer: 12 mL/min — ABNORMAL LOW (ref >90)
Glucose, Bld: 88 mg/dL (ref 70–99)
Potassium: 2.8 mEq/L — ABNORMAL LOW (ref 3.5–5.1)

## 2011-09-24 LAB — PROTEIN, URINE, 24 HOUR: Collection Interval-UPROT: 24 hours

## 2011-09-24 MED ORDER — CALCITRIOL 0.25 MCG PO CAPS
0.2500 ug | ORAL_CAPSULE | Freq: Every day | ORAL | Status: AC
  Administered 2011-09-24 – 2011-09-30 (×7): 0.25 ug via ORAL
  Filled 2011-09-24 (×7): qty 1

## 2011-09-24 MED ORDER — POTASSIUM CHLORIDE CRYS ER 20 MEQ PO TBCR
40.0000 meq | EXTENDED_RELEASE_TABLET | Freq: Three times a day (TID) | ORAL | Status: AC
  Administered 2011-09-24 (×2): 40 meq via ORAL
  Filled 2011-09-24 (×2): qty 2

## 2011-09-24 MED ORDER — CLONIDINE HCL 0.1 MG PO TABS: 0.1000 mg | ORAL_TABLET | Freq: Three times a day (TID) | ORAL | Status: DC

## 2011-09-24 MED ORDER — CALCITRIOL 0.25 MCG PO CAPS: 0.2500 ug | ORAL_CAPSULE | Freq: Every day | ORAL | Status: AC

## 2011-09-24 MED ORDER — HYDRALAZINE HCL 25 MG PO TABS: 25.0000 mg | ORAL_TABLET | Freq: Three times a day (TID) | ORAL | Status: DC

## 2011-09-24 MED ORDER — FUROSEMIDE 80 MG PO TABS
80.0000 mg | ORAL_TABLET | Freq: Two times a day (BID) | ORAL | Status: DC
  Administered 2011-09-24 – 2011-09-25 (×2): 80 mg via ORAL
  Filled 2011-09-24 (×4): qty 1

## 2011-09-24 MED ORDER — CARVEDILOL 25 MG PO TABS: 25.0000 mg | ORAL_TABLET | Freq: Two times a day (BID) | ORAL | Status: DC

## 2011-09-24 MED ORDER — CARVEDILOL 25 MG PO TABS
25.0000 mg | ORAL_TABLET | Freq: Two times a day (BID) | ORAL | Status: DC
  Administered 2011-09-24 – 2011-09-30 (×13): 25 mg via ORAL
  Filled 2011-09-24 (×15): qty 1

## 2011-09-24 MED ORDER — AMLODIPINE BESYLATE 10 MG PO TABS: 10.0000 mg | ORAL_TABLET | Freq: Every day | ORAL | Status: DC

## 2011-09-24 MED ORDER — FUROSEMIDE 80 MG PO TABS: 80.0000 mg | ORAL_TABLET | Freq: Every day | ORAL | Status: DC

## 2011-09-24 NOTE — Progress Notes (Signed)
Patient ID: Kenneth [name], male   DOB: 03-02-1956, 55 y.o.   MRN: [id number] Patient ID: Kenneth [name], male   DOB: [birth year], 55 y.o.   MRN: [id number] SUBJECTIVE: Denies any shortness of breath. No CP. Good diuresis. Wants to go home.  Filed Vitals:   09/23/11 1815 09/23/11 2038 09/23/11 2303 09/24/11 0450  BP: 138/88 152/85 152/85 156/87  Pulse: 72 76  78  Temp: 97.1 F (36.2 C) 98.5 F (36.9 C)  98.4 F (36.9 C)  TempSrc: Oral     Resp: 18 20  20   Height: 6' (1.829 m)     Weight: 99.9 kg (220 lb 3.8 oz)   95.2 kg (209 lb 14.1 oz)  SpO2: 94% 95%  100%    Intake/Output Summary (Last 24 hours) at 09/24/11 0759 Last data filed at 09/24/11 0737  Gross per 24 hour  Intake   1100 ml  Output   7000 ml  Net  -5900 ml    LABS: Basic Metabolic Panel:  Basename 09/24/11 0525 09/23/11 1423 09/21/11 2149  NA 138 135 --  K 2.8* 3.1* --  CL 96 97 --  CO2 27 25 --  GLUCOSE 88 128* --  BUN 62* 61* --  CREATININE 4.73* 4.76* --  CALCIUM 9.0 8.8 --  MG -- -- 2.2  PHOS -- 4.6 --   Liver Function Tests:  Basename 09/23/11 1423  AST --  ALT --  ALKPHOS --  BILITOT --  PROT --  ALBUMIN 2.6*   No results found for this basename: LIPASE:2,AMYLASE:2 in the last 72 hours CBC:  Basename 09/24/11 0525 09/23/11 1423 09/21/11 1550  WBC 5.9 5.5 --  NEUTROABS -- -- 4.0  HGB 11.9* 11.6* --  HCT 34.6* 34.1* --  MCV 88.5 88.6 --  PLT 213 207 --   Cardiac Enzymes:  Basename 09/22/11 1601 09/22/11 1019 09/21/11 2355  CKTOTAL 158 150 184  CKMB 5.1* 5.2* 6.7*  CKMBINDEX -- -- --  TROPONINI <0.30 <0.30 <0.30   BNP:  Basename 09/21/11 1550  POCBNP 30315.0*   D-Dimer: No results found for this basename: DDIMER:2 in the last 72 hours Hemoglobin A1C: No results found for this basename: HGBA1C in the last 72 hours Fasting Lipid Panel: No results found for this basename: CHOL,HDL,LDLCALC,TRIG,CHOLHDL,LDLDIRECT in the last 72 hours Thyroid Function Tests:  Basename 09/22/11 1807  09/21/11 2149  TSH -- 4.947*  T4TOTAL -- --  T3FREE 1.6* --  THYROIDAB -- --   Anemia Panel: No results found for this basename: VITAMINB12,FOLATE,FERRITIN,TIBC,IRON,RETICCTPCT in the last 72 hours  RADIOLOGY: Dg Chest 2 View  09/21/2011  *RADIOLOGY REPORT*  Clinical Data: Short of breath.  Hypertension.  CHF.  CHEST - 2 VIEW  Comparison: None.  Findings: Cardiomegaly.  Interstitial pulmonary edema.  Pulmonary vascular congestion.  Small bilateral pleural effusions.  Thickening of the fissures is present on the lateral view.  IMPRESSION: Mild CHF.  Original Report Authenticated By: Dereck Ligas, M.D.   US Renal  09/22/2011  *RADIOLOGY REPORT*  Clinical Data: Renal failure.  RENAL/URINARY TRACT ULTRASOUND COMPLETE  Comparison:  None.  Findings:  Right Kidney:  10.3 cm.  Increased echotexture.  No focal abnormality or hydronephrosis.  Left Kidney:  9.8 cm.  Increased echotexture.  No focal abnormality or hydronephrosis.  Bladder:  Normal.  Incidentally noted are bilateral pleural effusions and a small amount of perisplenic ascites.  IMPRESSION: Increased echotexture within the kidneys suggesting chronic medical renal disease.  No hydronephrosis.  Bilateral effusions, small perisplenic ascites.  Original Report Authenticated By: Raelyn Number, M.D.    PHYSICAL EXAM General: Well developed, well nourished, in no acute distress Head: Eyes PERRLA, No xanthomas.   Normal cephalic and atramatic  Lungs: DECREASED BS BIBASALLY. Heart: HRRR S1 S2, with soft S4 murmur.  Pulses are 2+ & equal.            No carotid bruit. No JVD.  No abdominal bruits. No femoral bruits. Abdomen: Bowel sounds are positive, abdomen soft and non-tender without masses or                  Hernia's noted. Msk:  Back normal, normal gait. Normal strength and tone for age. Extremities: No clubbing, cyanosis , 1-2+ edema  DP +1 Neuro: Alert and oriented X 3. Psych:  Good affect, responds appropriately  TELEMETRY:  Reviewed telemetry pt in NSR  ASSESSMENT AND PLAN:  Principal Problem:  *Hypertensive emergency Active Problems:  ARF (acute renal failure)  Acute CHF  Obesity, morbid   His BP and Urinary output improved. His ECHO shows systolic dysfunction with EF of 30-35%, moderate LVH, severe LAE, mild MR and moderate posterior pericardial effusion.  Need to replete potassium today.  Will increase carvedilol to 25 mg bid. Already on amlodipine, clonidine,hydralazine and diuretics. Not a candidate for ACEi or ARB due to CKD.  Daray Polgar [name], MD 09/24/2011 7:59 AM

## 2011-09-24 NOTE — Progress Notes (Signed)
Subjective: Awake, eating lunch, wants to go home  Objective: Vital signs in last 24 hours: Blood pressure 138/85, pulse 78, temperature 98.4 F (36.9 C), temperature source Oral, resp. rate 20, height 6' (1.829 m), weight 95.2 kg (209 lb 14.1 oz), SpO2 100.00%.   Intake/Output from previous day: 11/27 0701 - 11/28 0700 In: 1100 [P.O.:1000; IV Piggyback:100] Out: 6500 [Urine:6500] Intake/Output this shift: Total I/O In: -  Out: 500 [Urine:500]  Weight 109 kg on 26 Nov;  Wt today 95.2 kg  PHYSICAL EXAM General--awake, alert, eating lunch Chest--no crackles Heart--no rub Abd--nontender Extr--1+ edema  Lab Results:   Lab 09/24/11 0525 09/23/11 1423 09/22/11 0420  NA 138 135 137  K 2.8* 3.1* 4.1  CL 96 97 104  CO2 27 25 18*  BUN 62* 61* 58*  CREATININE 4.73* 4.76* 4.72*  EGFR -- -- --  GLUCOSE 88 -- --  CALCIUM 9.0 8.8 9.1  PHOS -- 4.6 --      Basename 09/24/11 0525 09/23/11 1423  WBC 5.9 5.5  HGB 11.9* 11.6*  HCT 34.6* 34.1*  PLT 213 207    Scheduled:   . amLODipine  10 mg Oral Daily  . carvedilol  25 mg Oral BID WC  . cloNIDine  0.1 mg Oral TID  . enoxaparin  30 mg Subcutaneous Q24H  . furosemide  160 mg Intravenous Q6H  . hydrALAZINE  25 mg Oral Q8H  . metolazone  5 mg Oral Daily  . potassium chloride  40 mEq Oral TID  . sodium chloride  3 mL Intravenous Q12H  . DISCONTD: carvedilol  12.5 mg Oral BID WC   Continuous:   . sodium chloride      Assessment/Plan:   1. High BP  2. CKD 4-5  3. Proteinuria  4. Fluid overload/pleural effusions/edema  5.  Hypokalemia Plan:  1. Continue with amlodipine, carvedilol, clonidine. Would avoid ACE inhibitor/ARBs for now. Diuresis is helping to get      blood pressure under better control  2. SPEP and UPEP have been ordered.24-hour urine for protein 368mg /24 hr and      24 hr urine creatinine 1753 mg/24Hr, PTH 264.  Begin calcitriol 0.25 mcg/d for secondary PTH     No IVs no needlesticks a left  forearm-save for vascular access , vein  apping scheduled 3.  SPEP/UPEP pending 4. D/c metolazone.  Decrease furosemide to 80 mg po BID. 5.  KCl ordered  LOS: 3 days   LOS: 3 days   Amaria Mundorf F 09/24/2011,1:24 PM

## 2011-09-24 NOTE — Progress Notes (Signed)
Subjective: He wants to go home.   Objective: Weight change: -5.9 kg (-13 lb 0.1 oz)  Intake/Output Summary (Last 24 hours) at 09/24/11 1723 Last data filed at 09/24/11 1245  Gross per 24 hour  Intake    240 ml  Output   1925 ml  Net  -1685 ml   BP 122/69  Pulse 62  Temp(Src) 97.7 F (36.5 C) (Oral)  Resp 22  Ht 6' (1.829 m)  Wt 95.2 kg (209 lb 14.1 oz)  BMI 28.46 kg/m2  SpO2 96% General appearance: alert, cooperative, appears stated age, fatigued, no distress and moderately obese Head: Normocephalic, without obvious abnormality, atraumatic, Mucous membranes are dry Lungs: Bilateral rails, with scattered wheezes, this is improved from the previous day. Her Heart: Regular rate and rhythm, S1, S2, soft 2/6 systolic ejection murmur Abdomen: Soft, nontender, mild obese, hypoactive bowel sounds Extremities: 1+ pitting edema from bilateral knees down Skin: 1+ pulses  Lab Results: Basic Metabolic Panel:  Basename 09/24/11 0525 09/23/11 1423 09/21/11 2149  NA 138 135 --  K 2.8* 3.1* --  CL 96 97 --  CO2 27 25 --  GLUCOSE 88 128* --  BUN 62* 61* --  CREATININE 4.73* 4.76* --  CALCIUM 9.0 8.8 --  MG -- -- 2.2  PHOS -- 4.6 --   CBC:  Basename 09/24/11 0525 09/23/11 1423  WBC 5.9 5.5  NEUTROABS -- --  HGB 11.9* 11.6*  HCT 34.6* 34.1*  MCV 88.5 88.6  PLT 213 207   Cardiac Enzymes:  Basename 09/22/11 1601 09/22/11 1019 09/21/11 2355  CKTOTAL 158 150 184  CKMB 5.1* 5.2* 6.7*  CKMBINDEX -- -- --  TROPONINI <0.30 <0.30 <0.30   BNP: No results found for this basename: POCBNP:3 in the last 72 hours Thyroid Function Tests:  Basename 09/22/11 1807 09/21/11 2149  TSH -- 4.947*  T4TOTAL -- --  FREET4 0.85 --  T3FREE 1.6* --  THYROIDAB -- --     Studies/Results: Echocardiogram:moderate LVH. Systolic function was moderately to severely reduced. The estimated ejection fraction was in the range of 30% to 35%. Doppler parameters are consistent with a reversible  restrictive pattern, indicative of decreased left ventricular diastolic compliance and/or increased left atrial pressure (grade 3 diastolic dysfunction).    Medications: Scheduled Meds:    . amLODipine  10 mg Oral Daily  . calcitRIOL  0.25 mcg Oral Daily  . carvedilol  25 mg Oral BID WC  . cloNIDine  0.1 mg Oral TID  . enoxaparin  30 mg Subcutaneous Q24H  . furosemide  80 mg Oral BID  . hydrALAZINE  25 mg Oral Q8H  . potassium chloride  40 mEq Oral TID  . sodium chloride  3 mL Intravenous Q12H  . DISCONTD: carvedilol  12.5 mg Oral BID WC  . DISCONTD: furosemide  160 mg Intravenous Q6H  . DISCONTD: metolazone  5 mg Oral Daily   Continuous Infusions:    . sodium chloride     PRN Meds:.acetaminophen, hydrALAZINE, labetalol, ondansetron (ZOFRAN) IV, sodium chloride, zolpidem  Assessment/Plan: Patient Active Hospital Problem List:  Patient is a 55 year old Afro-American male with no past medical history only because he has not seen a primary care physician ever. He came in complaining of acute shortness of breath and was found to have acute congestive heart failure, severe renal dysfunction which appears to be more chronic than acute with a creatinine of 4.7 and hypertensive urgency with a systolic blood pressure greater than 200. Patient was initially admitted and  placed in the step down unit. He started aggressive diuresis and blood pressure control. A 2-D echo was ordered and nephrology and cardiology were consulted. Over the last 24 hours, patient started to respond to diuresis and is being moved out of the step down unit. It will be as important long-term for his outpatient followup as it is in the acute care setting now.  Hypertensive emergency (09/22/2011)  I appreciate nephrology and cardiology help. His antihypertensive medications were titrated. He seems to be responding to IV Lasix he, although at this point I feel we are trying to prevent end organ damage.  Long-term, he  will need close followup with cardiology.  Acute CHF (09/22/2011)  above echo results are noted. Prior to discharge, maximize Coreg to 25 twice a day.   ARF (acute renal failure) (09/22/2011)  watching closely. Left arm save for possible hemodialysis, perhaps during this hospitalization. Appreciate nephrology help. Starting to diurese.  Obesity, morbid (09/22/2011)  counseled.   LOS: 3 days   Kenneth [name] 09/24/2011, 5:23 PM

## 2011-09-24 NOTE — Progress Notes (Signed)
*  PRELIMINARY RESULTS* Bilateral upper extremity vein mapping completed.  [place], [place] 09/24/2011, 3:59 PM

## 2011-09-24 NOTE — Progress Notes (Signed)
   CARE MANAGEMENT NOTE 09/24/2011  Patient:  Kenneth [name], Kenneth [name]   Account Number:  [id number]  Date Initiated:  09/23/2011  Documentation initiated by:  Tomi Bamberger  Subjective/Objective Assessment:   dx htn emergency  admit-lives with spouse.     Action/Plan:   Anticipated DC Date:  09/26/2011   Anticipated DC Plan:  [place]  CM consult  Follow-up appt scheduled      Choice offered to / List presented to:             Status of service:  Completed, signed off Medicare Important Message given?   (If response is "NO", the following Medicare IM given date fields will be blank) Date Medicare IM given:   Date Additional Medicare IM given:    Discharge Disposition:  HOME/SELF CARE  Per UR Regulation:  Reviewed for med. necessity/level of care/duration of stay  Comments:  09/24/11- 1430- Marvetta Gibbons RN, BSN [phone] Pt for discharge today, needs PCP- spoke with pt at bedside- per conversation pt states that he does have insurance coverage that is current with [employer]. He states that it is hard for him to afford his copays for his medications and he does not have a primary care doctor. Reviewed list of MDs accepting new pts and talked about [facility]. Pt states that he would like to check with [facility] to see if they are accepting new pts. Call made to practice of [facility] and appointment made for Wed. Dec.  5 at 10:45 with [name] NP.  09/23/11 11:00 Tomi Bamberger RN, BSN [phone] patient lives with spouse, NCM will continue to follow for dc needs.

## 2011-09-24 NOTE — Progress Notes (Signed)
09/23/11 11:00 Tomi Bamberger RN, BSN [phone] patient lives with spouse, NCM will continue to follow for dc needs.

## 2011-09-25 DIAGNOSIS — F10231 Alcohol dependence with withdrawal delirium: Secondary | ICD-10-CM | POA: Diagnosis not present

## 2011-09-25 LAB — BASIC METABOLIC PANEL
BUN: 62 mg/dL — ABNORMAL HIGH (ref 6–23)
Calcium: 9.4 mg/dL (ref 8.4–10.5)
GFR calc non Af Amer: 12 mL/min — ABNORMAL LOW (ref >90)
Glucose, Bld: 92 mg/dL (ref 70–99)
Sodium: 137 mEq/L (ref 135–145)

## 2011-09-25 MED ORDER — VITAMIN B-1 100 MG PO TABS
100.0000 mg | ORAL_TABLET | Freq: Every day | ORAL | Status: DC
  Administered 2011-09-25 – 2011-09-30 (×6): 100 mg via ORAL
  Filled 2011-09-25 (×7): qty 1

## 2011-09-25 MED ORDER — FOLIC ACID 1 MG PO TABS
1.0000 mg | ORAL_TABLET | Freq: Every day | ORAL | Status: DC
  Administered 2011-09-25 – 2011-09-30 (×6): 1 mg via ORAL
  Filled 2011-09-25 (×7): qty 1

## 2011-09-25 MED ORDER — THIAMINE HCL 100 MG/ML IJ SOLN
100.0000 mg | Freq: Every day | INTRAMUSCULAR | Status: DC
  Filled 2011-09-25 (×2): qty 1

## 2011-09-25 MED ORDER — HYDRALAZINE HCL 25 MG PO TABS
25.0000 mg | ORAL_TABLET | Freq: Three times a day (TID) | ORAL | Status: DC
  Administered 2011-09-25 – 2011-09-26 (×3): 25 mg via ORAL
  Filled 2011-09-25 (×6): qty 1

## 2011-09-25 MED ORDER — LORAZEPAM 2 MG/ML IJ SOLN: 1.0000 mg | Freq: Four times a day (QID) | INTRAMUSCULAR | Status: AC | PRN

## 2011-09-25 MED ORDER — LORAZEPAM 1 MG PO TABS
1.0000 mg | ORAL_TABLET | Freq: Four times a day (QID) | ORAL | Status: AC | PRN
  Administered 2011-09-26 – 2011-09-28 (×4): 1 mg via ORAL
  Filled 2011-09-25 (×4): qty 1

## 2011-09-25 MED ORDER — POTASSIUM CHLORIDE CRYS ER 20 MEQ PO TBCR: 40.0000 meq | EXTENDED_RELEASE_TABLET | Freq: Three times a day (TID) | ORAL | Status: DC

## 2011-09-25 MED ORDER — THERA M PLUS PO TABS
1.0000 | ORAL_TABLET | Freq: Every day | ORAL | Status: DC
  Administered 2011-09-25 – 2011-09-30 (×6): 1 via ORAL
  Filled 2011-09-25 (×7): qty 1

## 2011-09-25 NOTE — Progress Notes (Signed)
Patient Name: Kenneth [name] 09/25/2011 8:53 AM    Principal Problem:  *Acute (?on chronic) Systolic and Diastolic CHF Active Problems:  Hypertensive emergency  Acute on chronic kidney disease - stage IV-V  SUBJECTIVE: No overnight events. Pt reports feeling better. No chest pain, shortness of breath, palpitations or dizziness. He is ready to go home today and states he will follow up with cardiology as an outpatient.   OBJECTIVE  Temp:  [97.5 F (36.4 C)-98.5 F (36.9 C)] 98.5 F (36.9 C) (11/29 0500) Pulse Rate:  [62-73] 72  (11/29 0500) Resp:  [20-22] 20  (11/29 0500) BP: (122-141)/(69-85) 141/76 mmHg (11/29 0500) SpO2:  [95 %-96 %] 96 % (11/29 0500) Weight:  [89.3 kg (196 lb 13.9 oz)] 196 lb 13.9 oz (89.3 kg) (11/29 0500)  Intake/Output Summary (Last 24 hours) at 09/25/11 0853 Last data filed at 09/24/11 2300  Gross per 24 hour  Intake    480 ml  Output    950 ml  Net   -470 ml   Weight change: -10.6 kg (-23 lb 5.9 oz)  PHYSICAL EXAM  General: Middle-aged, unkempt, black male, in no acute distress. Head: Normocephalic, atraumatic, nares are without discharge.  Neck: Supple without bruits or JVD. Lungs:  Decreased throughout, Resp regular and unlabored Heart: RRR no murmurs, rubs or gallops Abdomen: Soft, non-tender, non-distended, BS normoactive  Extremities: No clubbing, cyanosis or edema. DP/PT/Radials 2+ and equal bilaterally. Neuro: Alert and oriented X 3. Moves all extremities spontaneously. Psych: Flat affect.  LABS: CBC: Basename 09/24/11 0525 09/23/11 1423  WBC 5.9 5.5  HGB 11.9* 11.6*  HCT 34.6* 34.1*  MCV 88.5 88.6  PLT 213 [id number]   Basic Metabolic Panel:  Basename 09/25/11 0650 09/24/11 0525 09/23/11 1423  NA 137 138 --  K 3.4* 2.8* --  CL 91* 96 --  CO2 30 27 --  GLUCOSE 92 88 --  BUN 62* 62* --  CREATININE 4.81* 4.73* --  CALCIUM 9.4 9.0 --  PHOS -- -- 4.6   Liver Function Tests: Basename 09/23/11 1423  AST --  ALT --    ALKPHOS --  BILITOT --  PROT --  ALBUMIN 2.6*   Cardiac Enzymes: Basename 09/22/11 1601 09/22/11 1019  CKTOTAL 158 150  CKMB 5.1* 5.2*  TROPONINI <0.30 <0.30   BNP:  Basename  09/21/11 1550   POCBNP  30315.0*    Thyroid Function Tests: Basename 09/22/11 1807 09/21/11  TSH -- 4.947*  T4TOTAL --   T3FREE 1.6*    TELE: Sinus rhythm, 70s  Radiology/Studies:  Dg Chest 2 View 09/21/2011   Findings: Cardiomegaly.  Interstitial pulmonary edema.  Pulmonary vascular congestion.  Small bilateral pleural effusions.  Thickening of the fissures is present on the lateral view.  IMPRESSION: Mild CHF.    US Renal 09/22/2011   Findings:  Right Kidney:  10.3 cm.  Increased echotexture.  No focal abnormality or hydronephrosis.  Left Kidney:  9.8 cm.  Increased echotexture.  No focal abnormality or hydronephrosis.  Bladder:  Normal.  Incidentally noted are bilateral pleural effusions and a small amount of perisplenic ascites.  IMPRESSION: Increased echotexture within the kidneys suggesting chronic medical renal disease.  No hydronephrosis.  Bilateral effusions, small perisplenic ascites.     Inpatient Medications: . amLODipine  10 mg Oral Daily  . calcitRIOL  0.25 mcg Oral Daily  . carvedilol  25 mg Oral BID WC  . cloNIDine  0.1 mg Oral TID  . enoxaparin  30 mg Subcutaneous Q24H  .  furosemide  80 mg Oral BID  . hydrALAZINE  25 mg Oral Q8H  . potassium chloride  40 mEq Oral TID  . sodium chloride  3 mL Intravenous Q12H  . DISCONTD: metolazone  5 mg Oral Daily    ASSESSMENT AND PLAN:  1. Hypertensive emergency - Improved. BP 120s-140s/80s over the last 24hrs. Continue Amlodipine, Carvediolol, Clonidine, and Hydralazine. Avoid ACE-inhibitors for now due to acute renal insufficiency. 2. Acute (?on chronic) Systolic & Diastolic CHF - 2D echo on 09/22/11 revealed moderate LVH with moderately to severely reduced systolic function (EF 99991111), grade 3 diastolic dysfunction, mild MR, moderately to  severely dilated LA, mildly dilated RV, and mild to moderate TR. BNP on admission was 30,000. He diuresed well and doesn't appear to be fluid overloaded this morning. Continue carvedilol. Will need close follow up with Cardiology as an outpatient. Avoid ACE-inhibitors for now due to acute renal insufficiency. 3. Acute on Chronic Kidney Disease (Stage 4-5) - Nephrology following.  4. Hypokalemia - K+ 3.4 this am. Continue supplementation.   Signed, Kathryn Linarez , PA-C

## 2011-09-25 NOTE — Plan of Care (Signed)
Problem: Discharge Progression Outcomes Goal: Barriers To Progression Addressed/Resolved Outcome: Not Progressing Pt confused and not a good candidate for discharge teaching.

## 2011-09-25 NOTE — Plan of Care (Signed)
Problem: Phase I Progression Outcomes Goal: EF % per last Echo/documented,Core Reminder form on chart Outcome: Completed/Met Date Met:  09/25/11 EF 30-35% as of ECHO performed on 09/22/11

## 2011-09-25 NOTE — Progress Notes (Signed)
Subjective: He wants to go home.   Objective: Weight change: -10.6 kg (-23 lb 5.9 oz)  Intake/Output Summary (Last 24 hours) at 09/25/11 1410 Last data filed at 09/25/11 0909  Gross per 24 hour  Intake    480 ml  Output    950 ml  Net   -470 ml   BP 141/76  Pulse 72  Temp(Src) 98.5 F (36.9 C) (Oral)  Resp 20  Ht 6' (1.829 m)  Wt 89.3 kg (196 lb 13.9 oz)  BMI 26.70 kg/m2  SpO2 96% General appearance: alert, cooperative, appears stated age, fatigued, no distress and moderately obese Head: Normocephalic, without obvious abnormality, atraumatic, Mucous membranes are dry Lungs: Bilateral rails, with scattered wheezes, this is improved from the previous day. Her Heart: Regular rate and rhythm, S1, S2, soft 2/6 systolic ejection murmur Abdomen: Soft, nontender, mild obese, hypoactive bowel sounds Extremities: 1+ pitting edema from bilateral knees down Skin: 1+ pulses  Lab Results: Basic Metabolic Panel:  Basename 09/25/11 0650 09/24/11 0525 09/23/11 1423  NA 137 138 --  K 3.4* 2.8* --  CL 91* 96 --  CO2 30 27 --  GLUCOSE 92 88 --  BUN 62* 62* --  CREATININE 4.81* 4.73* --  CALCIUM 9.4 9.0 --  MG -- -- --  PHOS -- -- 4.6   CBC:  Basename 09/24/11 0525 09/23/11 1423  WBC 5.9 5.5  NEUTROABS -- --  HGB 11.9* 11.6*  HCT 34.6* 34.1*  MCV 88.5 88.6  PLT 213 207   Cardiac Enzymes:  Basename 09/22/11 1601  CKTOTAL 158  CKMB 5.1*  CKMBINDEX --  TROPONINI <0.30   BNP: No results found for this basename: POCBNP:3 in the last 72 hours Thyroid Function Tests:  Basename 09/22/11 1807  TSH --  T4TOTAL --  FREET4 0.85  T3FREE 1.6*  THYROIDAB --     Studies/Results: Echocardiogram:moderate LVH. Systolic function was moderately to severely reduced. The estimated ejection fraction was in the range of 30% to 35%. Doppler parameters are consistent with a reversible restrictive pattern, indicative of decreased left ventricular diastolic compliance and/or increased  left atrial pressure (grade 3 diastolic dysfunction).    Medications: Scheduled Meds:    . amLODipine  10 mg Oral Daily  . calcitRIOL  0.25 mcg Oral Daily  . carvedilol  25 mg Oral BID WC  . enoxaparin  30 mg Subcutaneous Q24H  . folic acid  1 mg Oral Daily  . hydrALAZINE  25 mg Oral Q8H  . multivitamins ther. w/minerals  1 tablet Oral Daily  . potassium chloride  40 mEq Oral TID  . sodium chloride  3 mL Intravenous Q12H  . thiamine  100 mg Oral Daily   Or  . thiamine  100 mg Intravenous Daily  . DISCONTD: cloNIDine  0.1 mg Oral TID  . DISCONTD: furosemide  80 mg Oral BID  . DISCONTD: hydrALAZINE  25 mg Oral Q8H  . DISCONTD: potassium chloride  40 mEq Oral TID   Continuous Infusions:    . DISCONTD: sodium chloride     PRN Meds:.acetaminophen, hydrALAZINE, labetalol, LORazepam, LORazepam, ondansetron (ZOFRAN) IV, sodium chloride, DISCONTD: zolpidem  Assessment/Plan: Patient Active Hospital Problem List:  Patient is [name] with no past medical history only because he has not seen a primary care physician ever. He came in complaining of acute shortness of breath and was found to have acute congestive heart failure, severe renal dysfunction which appears to be more chronic than acute with a creatinine  of 4.7 and hypertensive urgency with a systolic blood pressure greater than 200. Patient was initially admitted and placed in the step down unit. He started aggressive diuresis and blood pressure control. A 2-D echo was ordered and nephrology and cardiology were consulted.  Alcohol withdrawal delirium - vitamins, ativan prn  Hypertensive emergency (09/22/2011) - RESOLVED   HTN stage III - controlled   Acute systolic and diastolic CHF - patient was diuresed to normovolemia. Now c/w BB. No ACEI due to renal failure       LOS: 4 days   Kenneth [name] 09/25/2011, 2:10 PM

## 2011-09-25 NOTE — Progress Notes (Signed)
Subjective: Awake, somewhat tremulous.  Daughter [name] at bedside. Dr. Marye Round wonders whether we're seeing EtOH withdrawal.  Objective: Vital signs in last 24 hours: Blood pressure 141/76, pulse 72, temperature 98.5 F (36.9 C), temperature source Oral, resp. rate 20, height 6' (1.829 m), weight 89.3 kg (196 lb 13.9 oz), SpO2 96.00%.   Intake/Output from previous day: 11/28 0701 - 11/29 0700 In: 480 [P.O.:480] Out: 1450 [Urine:1450] Intake/Output this shift: Total I/O In: 240 [P.O.:240] Out: -   WEIGHT 109 kg    on 26 Nov 105.8 kg on 27 Nov 95.2 kg   on 28 Nov 89.3 kg   on 29 Nov   On furosemide 80 BID   PHYSICAL EXAM General--awake, tremulous Chest--clear Heart--no rub Abd--nontender Extr--trace edema  Lab Results:   Lab 09/25/11 0650 09/24/11 0525 09/23/11 1423  NA 137 138 135  K 3.4* 2.8* 3.1*  CL 91* 96 97  CO2 30 27 25   BUN 62* 62* 61*  CREATININE 4.81* 4.73* 4.76*  EGFR -- -- --  GLUCOSE 92 -- --  CALCIUM 9.4 9.0 8.8  PHOS -- -- 4.6      Basename 09/24/11 0525 09/23/11 1423  WBC 5.9 5.5  HGB 11.9* 11.6*  HCT 34.6* 34.1*  PLT 213 207    Scheduled: Continuous:   . DISCONTD: sodium chloride      Assessment/Plan: 1. High BP  2. CKD 4-5  3. Proteinuria  4. Fluid overload/pleural effusions/edema  5. Hypokalemia  Plan:  1. Continue with amlodipine, carvedilol, clonidine. Would avoid ACE inhibitor/ARBs for now. Will hold Lasix for 24-36 hr.  Fe/TIBC/Ferritn in AM 2. SPEP and UPEP have been ordered.        24-hour urine for protein 368mg /24 hr and  24 hr urine creatinine 1753 mg/24Hr,  PTH 264.  Begin calcitriol 0.25 mcg/d for secondary PTH  No IVs no needlesticks a left forearm-save for vascular access , veinm apping scheduled  3. SPEP/UPEP pending  4. D/c metolazone. Hold furosemide today and tomorrow AM 5. KCl ordered     LOS: 4 days   Delrico Minehart F 09/25/2011,11:48 AM

## 2011-09-25 NOTE — Progress Notes (Signed)
Patient seen and examined and history reviewed. Agree with above findings and plan. Blood pressure has improved significantly. Potassium levels improved.  Continue current cardiac care. Follow up with Dr Verl Blalock as outpatient.  Luana Shu 09/25/2011 2:51 PM

## 2011-09-26 LAB — CBC
MCHC: 35.7 g/dL (ref 30.0–36.0)
Platelets: 243 10*3/uL (ref 150–400)
RDW: 16 % — ABNORMAL HIGH (ref 11.5–15.5)

## 2011-09-26 LAB — UIFE/LIGHT CHAINS/TP QN, 24-HR UR
Albumin, U: DETECTED
Alpha 1, Urine: DETECTED — AB
Alpha 2, Urine: DETECTED — AB
Beta, Urine: DETECTED — AB
Gamma Globulin, Urine: NOT DETECTED

## 2011-09-26 LAB — IRON AND TIBC: Iron: 27 ug/dL — ABNORMAL LOW (ref 42–135)

## 2011-09-26 LAB — FERRITIN: Ferritin: 121 ng/mL (ref 22–322)

## 2011-09-26 LAB — RENAL FUNCTION PANEL
Albumin: 3.1 g/dL — ABNORMAL LOW (ref 3.5–5.2)
CO2: 32 mEq/L (ref 19–32)
Calcium: 9.6 mg/dL (ref 8.4–10.5)
GFR calc Af Amer: 14 mL/min — ABNORMAL LOW (ref >90)
GFR calc non Af Amer: 12 mL/min — ABNORMAL LOW (ref >90)
Phosphorus: 4.5 mg/dL (ref 2.3–4.6)
Sodium: 136 mEq/L (ref 135–145)

## 2011-09-26 MED ORDER — FERUMOXYTOL INJECTION 510 MG/17 ML
510.0000 mg | Freq: Once | INTRAVENOUS | Status: DC
  Filled 2011-09-26: qty 17

## 2011-09-26 MED ORDER — CLONIDINE HCL 0.1 MG PO TABS
0.1000 mg | ORAL_TABLET | Freq: Two times a day (BID) | ORAL | Status: DC
  Administered 2011-09-26 (×2): 0.1 mg via ORAL
  Filled 2011-09-26 (×5): qty 1

## 2011-09-26 MED ORDER — POTASSIUM CHLORIDE CRYS ER 20 MEQ PO TBCR
40.0000 meq | EXTENDED_RELEASE_TABLET | Freq: Two times a day (BID) | ORAL | Status: DC
Start: 2011-09-26 — End: 2011-09-27
  Administered 2011-09-26 (×2): 40 meq via ORAL
  Filled 2011-09-26 (×4): qty 2

## 2011-09-26 MED ORDER — HYDRALAZINE HCL 50 MG PO TABS
50.0000 mg | ORAL_TABLET | Freq: Two times a day (BID) | ORAL | Status: DC
  Administered 2011-09-26 – 2011-09-30 (×9): 50 mg via ORAL
  Filled 2011-09-26 (×10): qty 1

## 2011-09-26 NOTE — Progress Notes (Signed)
Subjective:  Awake, says he's going home today "as soon as my wife picks me up."  Objective: Vital signs in last 24 hours: Blood pressure 160/84, pulse 70, temperature 97.6 F (36.4 C), temperature source Oral, resp. rate 18, height 6' (1.829 m), weight 89.3 kg (196 lb 13.9 oz), SpO2 96.00%.   Intake/Output from previous day: 11/29 0701 - 11/30 0700 In: 240 [P.O.:240] Out: 950 [Urine:950] Intake/Output this shift: Total I/O In: 240 [P.O.:240] Out: -  Wt Readings from Last 3 Encounters:  09/25/11 89.3 kg (196 lb 13.9 oz)  wt not done today Wt 107.9 kg at time of admission Currently off furosemide   PHYSICAL EXAM General--awake. alert Chest--clear  Heart--no rub Abd--nontender Extr--trace edema  Lab Results:   Lab 09/26/11 0555 09/25/11 0650 09/24/11 0525 09/23/11 1423  NA 136 137 138 --  K 3.1* 3.4* 2.8* --  CL 90* 91* 96 --  CO2 32 30 27 --  BUN 65* 62* 62* --  CREATININE 4.78* 4.81* 4.73* --  EGFR -- -- -- --  GLUCOSE 91 -- -- --  CALCIUM 9.6 9.4 9.0 --  PHOS 4.5 -- -- 4.6      Basename 09/26/11 0555 09/24/11 0525  WBC 4.9 5.9  HGB 13.6 11.9*  HCT 38.1* 34.6*  PLT 243 213       Assessment/Plan: 1. High BP  2. CKD 4-5  3. Proteinuria  4. Fluid overload/pleural effusions/edema  5. Hypokalemia  6.  Anemia  Plan:  1. Continue with amlodipine, carvedilol, clonidine. Would avoid ACE inhibitor/ARBs for now. Will probably need lasix 40-80 mg/day at time of d/c .  BP still high.  Will increase hydralazine to 50 BID 2. SPEP and UPEP have been ordered.  24-hour urine for protein 368mg /24 hr and  24 hr urine creatinine 1753 mg/24Hr,  PTH 264.  Begin calcitriol 0.25 mcg/d for secondary PTH  No IVs no needlesticks a left forearm-save for vascular access , veinm apping scheduled  3. SPEP/UPEPstill  pending  4. D/c metolazone. Hold furosemide today and tomorrow AM  5. KCl ordered, renal profile, Mg, CBC in am 6.  Rx IV FE feraheme 510 mg iv today    LOS: 5 days   Shahid Flori F 09/26/2011,12:22 PM

## 2011-09-26 NOTE — Progress Notes (Signed)
Patient Name: Kenneth [name] 09/26/2011 7:45 AM    Principal Problem:  *Acute (?on chronic) Systolic and Diastolic CHF Active Problems:  Hypertensive emergency  Acute on chronic kidney disease - stage IV-V  SUBJECTIVE: No overnight events. Pt reports feeling OK. No chest pain, shortness of breath, palpitations or dizziness. Having withdrawal with disorientation.     OBJECTIVE  Temp:  [97.2 F (36.2 C)-97.6 F (36.4 C)] 97.6 F (36.4 C) (11/29 2107) Pulse Rate:  [67-72] 70  (11/29 2300) Resp:  [18-20] 18  (11/29 2107) BP: (120-160)/(71-95) 160/84 mmHg (11/30 0633) SpO2:  [93 %-96 %] 96 % (11/29 2107)  Intake/Output Summary (Last 24 hours) at 09/26/11 0745 Last data filed at 09/26/11 0300  Gross per 24 hour  Intake    240 ml  Output    950 ml  Net   -710 ml   Weight change:   PHYSICAL EXAM  General: Middle-aged, unkempt, black male, in no acute distress. Head: Normocephalic, atraumatic, nares are without discharge.  Neck: Supple without bruits or JVD. Lungs:  Decreased throughout, Resp regular and unlabored Heart: RRR , soft systolic murmur Abdomen: Soft, non-tender, non-distended, BS normoactive  Extremities: No clubbing, cyanosis or edema. DP/PT/Radials 2+ and equal bilaterally. Neuro: Alert and oriented X 1. Moves all extremities spontaneously. Psych: Flat affect.  LABS: CBC: Basename 09/24/11 0525 09/23/11 1423  WBC 5.9 5.5  HGB 11.9* 11.6*  HCT 34.6* 34.1*  MCV 88.5 88.6  PLT 213 [id number]   Basic Metabolic Panel:  Basename 09/25/11 0650 09/24/11 0525 09/23/11 1423  NA 137 138 --  K 3.4* 2.8* --  CL 91* 96 --  CO2 30 27 --  GLUCOSE 92 88 --  BUN 62* 62* --  CREATININE 4.81* 4.73* --  CALCIUM 9.4 9.0 --  PHOS -- -- 4.6   Liver Function Tests:  Basename 09/26/11 0555 09/23/11 1423  AST -- --  ALT -- --  ALKPHOS -- --  BILITOT -- --  PROT -- --  ALBUMIN 3.1* 2.6*   Cardiac Enzymes:No results found for this basename:  CKTOTAL:4,CKMB:4,TROPONINI:4 in the last 72 hours BNP:  Basename  09/21/11 1550   POCBNP  30315.0*    Thyroid Function Tests: Basename 09/22/11 1807 09/21/11  TSH -- 4.947*  T4TOTAL --   T3FREE 1.6*    TELE: Sinus rhythm, 70s  Radiology/Studies:  Dg Chest 2 View 09/21/2011   Findings: Cardiomegaly.  Interstitial pulmonary edema.  Pulmonary vascular congestion.  Small bilateral pleural effusions.  Thickening of the fissures is present on the lateral view.  IMPRESSION: Mild CHF.    US Renal 09/22/2011   Findings:  Right Kidney:  10.3 cm.  Increased echotexture.  No focal abnormality or hydronephrosis.  Left Kidney:  9.8 cm.  Increased echotexture.  No focal abnormality or hydronephrosis.  Bladder:  Normal.  Incidentally noted are bilateral pleural effusions and a small amount of perisplenic ascites.  IMPRESSION: Increased echotexture within the kidneys suggesting chronic medical renal disease.  No hydronephrosis.  Bilateral effusions, small perisplenic ascites.     Inpatient Medications: . amLODipine  10 mg Oral Daily  . calcitRIOL  0.25 mcg Oral Daily  . carvedilol  25 mg Oral BID WC  . cloNIDine  0.1 mg Oral TID  . enoxaparin  30 mg Subcutaneous Q24H  . furosemide  80 mg Oral BID  . hydrALAZINE  25 mg Oral Q8H  . potassium chloride  40 mEq Oral TID  . sodium chloride  3 mL Intravenous Q12H  .  DISCONTD: metolazone  5 mg Oral Daily    ASSESSMENT AND PLAN:  1. Hypertensive emergency - Improved. BP 120s-160/80s over the last 24hrs. Continue Amlodipine, Carvediolol, Clonidine, and Hydralazine. Avoid ACE-inhibitors for now due [name] acute renal insufficiency. 2. Acute (?on chronic) Systolic & Diastolic CHF - 2D echo on 09/22/11 revealed moderate LVH with moderately [name] severely reduced systolic function (EF [id number]), grade 3 diastolic dysfunction, mild MR, moderately [name] severely dilated LA, mildly dilated RV, and mild [name] moderate TR. BNP on admission was 30,000. He diuresed well and doesn't  appear [name] be fluid overloaded this morning. Continue carvedilol. Will need close follow up with Cardiology as an outpatient. Avoid ACE-inhibitors for now due [name] acute renal insufficiency. 3. Acute on Chronic Kidney Disease (Stage 4-5) - Nephrology following.  4. Hypokalemia - K+ 3.1 this am. Continue supplementation.  5. ETOH withdrawal   Patient is on appropriate cardiac therapy at this point. Will need follow up cardiology visit on discharge with Dr. Verl Blalock. I will sign off now. Please call with any new cardiac questions, concerns.  Signed, Peter [name] , MD

## 2011-09-26 NOTE — Progress Notes (Signed)
Clinical Social Work, 09/26/11, 1340:  CSW received referral for patient but unable to complete due to patient going through DT's and unable to answer questions.  CSW will follow up when patient is more stable to complete assessment.[place], [place], [place]

## 2011-09-26 NOTE — Plan of Care (Signed)
Problem: Consults Goal: Heart Failure Patient Education (See Patient Education module for education specifics.)  Outcome: Progressing Pt. Is still currently confused and not able to be educated at this time.

## 2011-09-26 NOTE — Progress Notes (Signed)
Subjective: Remains confused this AM. DC was cancelled yesterday due to developing alcohol withdrawal delirium  Objective: Weight change:   Intake/Output Summary (Last 24 hours) at 09/26/11 1337 Last data filed at 09/26/11 0857  Gross per 24 hour  Intake    240 ml  Output    950 ml  Net   -710 ml   BP 160/84  Pulse 70  Temp(Src) 97.6 F (36.4 C) (Oral)  Resp 18  Ht 6' (1.829 m)  Wt 89.3 kg (196 lb 13.9 oz)  BMI 26.70 kg/m2  SpO2 96% General appearance: mildly agitated . Slow to respond. Not oriented to place time situation   Lungs: CTAB Heart: Regular rate and rhythm, S1, S2, soft 2/6 systolic ejection murmur Abdomen: Soft, nontender, mild obese, hypoactive bowel sounds Extremities: 1+ pitting edema from bilateral knees down   Lab Results: Basic Metabolic Panel:  Basename 09/26/11 0555 09/25/11 0650 09/23/11 1423  NA 136 137 --  K 3.1* 3.4* --  CL 90* 91* --  CO2 32 30 --  GLUCOSE 91 92 --  BUN 65* 62* --  CREATININE 4.78* 4.81* --  CALCIUM 9.6 9.4 --  MG -- -- --  PHOS 4.5 -- 4.6   CBC:  Basename 09/26/11 0555 09/24/11 0525  WBC 4.9 5.9  NEUTROABS -- --  HGB 13.6 11.9*  HCT 38.1* 34.6*  MCV 88.4 88.5  PLT 243 213   Cardiac Enzymes: No results found for this basename: CKTOTAL:3,CKMB:3,CKMBINDEX:3,TROPONINI:3 in the last 72 hours BNP: No results found for this basename: POCBNP:3 in the last 72 hours Thyroid Function Tests: No results found for this basename: TSH,T4TOTAL,FREET4,T3FREE,THYROIDAB in the last 72 hours   Studies/Results: Echocardiogram:moderate LVH. Systolic function was moderately to severely reduced. The estimated ejection fraction was in the range of 30% to 35%. Doppler parameters are consistent with a reversible restrictive pattern, indicative of decreased left ventricular diastolic compliance and/or increased left atrial pressure (grade 3 diastolic dysfunction).    Medications: Scheduled Meds:    . amLODipine  10 mg Oral  Daily  . calcitRIOL  0.25 mcg Oral Daily  . carvedilol  25 mg Oral BID WC  . cloNIDine  0.1 mg Oral BID  . enoxaparin  30 mg Subcutaneous Q24H  . ferumoxytol  510 mg Intravenous Once  . folic acid  1 mg Oral Daily  . hydrALAZINE  50 mg Oral BID  . multivitamins ther. w/minerals  1 tablet Oral Daily  . potassium chloride  40 mEq Oral BID  . sodium chloride  3 mL Intravenous Q12H  . thiamine  100 mg Oral Daily   Or  . thiamine  100 mg Intravenous Daily  . DISCONTD: hydrALAZINE  25 mg Oral Q8H   Continuous Infusions:   PRN Meds:.acetaminophen, hydrALAZINE, labetalol, LORazepam, LORazepam, ondansetron (ZOFRAN) IV, sodium chloride  Assessment/Plan: Patient is [name] with no past medical history only because he has not seen a primary care physician ever. He came in complaining of acute shortness of breath and was found to have acute congestive heart failure, severe renal dysfunction which appears to be more chronic than acute with a creatinine of 4.7 and hypertensive urgency with a systolic blood pressure greater than 200. Patient was initially admitted and placed in the step down unit. He started aggressive diuresis and blood pressure control. A 2-D echo was ordered and nephrology and cardiology were consulted.  Current active issues  Alcohol withdrawal delirium : - started 09/24/11 around midnight and got worse through the  day on 09/25/11. Now improving slowly . Continue ativan per CIWA and vitamins  Hypertensive emergency (09/22/2011) - RESOLVED   HTN stage III - uncontrolled today - nephro increase hydralazine , i will resume clonidine   Acute systolic and diastolic CHF - patient was diuresed to normovolemia. Now c/w BB. No ACEI due to renal failure     CKD stage IV - close outpatient F/U   LOS: 5 days   Princessa Lesmeister 09/26/2011, 1:37 PM

## 2011-09-27 LAB — RENAL FUNCTION PANEL
CO2: 30 mEq/L (ref 19–32)
Calcium: 9.5 mg/dL (ref 8.4–10.5)
Creatinine, Ser: 5.08 mg/dL — ABNORMAL HIGH (ref 0.50–1.35)
GFR calc Af Amer: 13 mL/min — ABNORMAL LOW (ref >90)
Glucose, Bld: 96 mg/dL (ref 70–99)

## 2011-09-27 LAB — CBC
HCT: 37.8 % — ABNORMAL LOW (ref 39.0–52.0)
Hemoglobin: 13.4 g/dL (ref 13.0–17.0)
MCH: 31.3 pg (ref 26.0–34.0)
MCHC: 35.4 g/dL (ref 30.0–36.0)
RBC: 4.28 MIL/uL (ref 4.22–5.81)

## 2011-09-27 NOTE — Consult Note (Signed)
Subjective:  Alert, no complaints.  Appears sluggish mentally. No SOB, N/V.    Objective Vital signs in last 24 hours: Filed Vitals:   09/26/11 2108 09/27/11 0200 09/27/11 0501 09/27/11 1047  BP: 134/74 122/71 109/77 138/73  Pulse:  70 70 71  Temp:   98.3 F (36.8 C)   TempSrc:      Resp:   18   Height:      Weight:   86.818 kg (191 lb 6.4 oz)   SpO2:   97%    Weight change: -0.363 kg (-12.8 oz)  Intake/Output Summary (Last 24 hours) at 09/27/11 1218 Last data filed at 09/27/11 1048  Gross per 24 hour  Intake   1023 ml  Output   1000 ml  Net     23 ml   Labs: Basic Metabolic Panel:  Lab [id number] 09/26/11 0555 09/25/11 0650 09/24/11 0525 09/23/11 1423 09/22/11 0420 09/21/11 2149 09/21/11 1706  NA 136 136 137 138 135 137 -- 138  K 3.6 3.1* 3.4* 2.8* 3.1* 4.1 -- 4.1  CL 91* 90* 91* 96 97 104 -- 111  CO2 30 32 30 27 25  18* -- --  GLUCOSE 96 91 92 88 128* 85 -- 103*  BUN 69* 65* 62* 62* 61* 58* -- 55*  CREATININE 5.08* 4.78* 4.81* 4.73* 4.76* 4.72* 4.72* --  ALB -- -- -- -- -- -- -- --  CALCIUM 9.5 9.6 9.4 9.0 8.8 9.1 -- --  PHOS 4.3 4.5 -- -- 4.6 -- -- --   Liver Function Tests:  Lab 09/27/11 0530 09/26/11 0555 09/23/11 1423  AST -- -- --  ALT -- -- --  ALKPHOS -- -- --  BILITOT -- -- --  PROT -- -- --  ALBUMIN 3.1* 3.1* 2.6*   No results found for this basename: LIPASE:3,AMYLASE:3 in the last 168 hours No results found for this basename: AMMONIA:3 in the last 168 hours CBC:  Lab 09/27/11 0530 09/26/11 0555 09/24/11 0525 09/23/11 1423 09/21/11 1550  WBC 4.8 4.9 5.9 5.5 --  NEUTROABS -- -- -- -- 4.0  HGB 13.4 13.6 11.9* 11.6* --  HCT 37.8* 38.1* 34.6* 34.1* --  MCV 88.3 88.4 88.5 88.6 --  PLT 267 243 213 207 --   PT/INR: @labrcntip (inr:5) Cardiac Enzymes:  Lab 09/22/11 1601 09/22/11 1019 09/21/11 2355 09/21/11 1700  CKTOTAL 158 150 184 --  CKMB 5.1* 5.2* 6.7* --  CKMBINDEX -- -- -- --  TROPONINI <0.30 <0.30 <0.30 <0.30   CBG: No results found  for this basename: GLUCAP:5 in the last 168 hours  Iron Studies:  Lab 09/26/11 0555  IRON 27*  TIBC 308  TRANSFERRIN --  FERRITIN 121   Studies/Results:  Last CXR was several days ago on 11/25 with mild edema. No results found. Medications:      . calcitRIOL  0.25 mcg Oral Daily  . carvedilol  25 mg Oral BID WC  . enoxaparin  30 mg Subcutaneous Q24H  . ferumoxytol  510 mg Intravenous Once  . folic acid  1 mg Oral Daily  . hydrALAZINE  50 mg Oral BID  . multivitamins ther. w/minerals  1 tablet Oral Daily  . sodium chloride  3 mL Intravenous Q12H  . thiamine  100 mg Oral Daily  . DISCONTD: amLODipine  10 mg Oral Daily  . DISCONTD: cloNIDine  0.1 mg Oral BID  . DISCONTD: hydrALAZINE  25 mg Oral Q8H  . DISCONTD: potassium chloride  40 mEq Oral BID  .  DISCONTD: thiamine  100 mg Intravenous Daily    I  have reviewed scheduled and prn medications.  Physical Exam: General: awake, no distress, vague in his responses Heart: reg without rub or S3 Lungs: CTA bilaterally  Abdomen: soft, nontender Extremities: no edema Neuro: no focal deficits  Problem/Plan: 1. CKD - stage IV/V, creatinine in high 4 to low 5's.  No indication for acute dialysis.  Suspect HTN renal damage from years of poorly treated BP.  SPEP and UPEP are pending.  US show 10 cm kidneys highly echogenic.  No new recommendations, will see again on Monday. 2. AMS -- etoh withdrawal on CIWA protocol per primary. 3. HTN  -- good control on current regimen. 4. Secondary hyperparathyroidism - vit D 5. Anemia - Hb 13's, noESA. 6. Lytes/Nutrition - stable.  [place] D  09/27/2011,12:18 PM  LOS: 6 days  Cell #  [phone]

## 2011-09-27 NOTE — Progress Notes (Signed)
Subjective: Remains confused. Does not know he is in the hospital.  Objective: Weight change: -0.363 kg (-12.8 oz)  Intake/Output Summary (Last 24 hours) at 09/27/11 1730 Last data filed at 09/27/11 1300  Gross per 24 hour  Intake    723 ml  Output    700 ml  Net     23 ml   BP 137/79  Pulse 78  Temp(Src) 97.5 F (36.4 C) (Oral)  Resp 19  Ht 6' (1.829 m)  Wt 86.818 kg (191 lb 6.4 oz)  BMI 25.96 kg/m2  SpO2 96% Alert oriented to himself only confused about location of his house Does not know why he is in the hospital Chest clear to auscultation Heart regular rate and rhythm without murmurs rubs or gallops Abdomen soft nontender  Lab Results: Basic Metabolic Panel:  Basename 09/27/11 0530 09/26/11 0555  NA 136 136  K 3.6 3.1*  CL 91* 90*  CO2 30 32  GLUCOSE 96 91  BUN 69* 65*  CREATININE 5.08* 4.78*  CALCIUM 9.5 9.6  MG 2.2 --  PHOS 4.3 4.5   CBC:  Basename 09/27/11 0530 09/26/11 0555  WBC 4.8 4.9  NEUTROABS -- --  HGB 13.4 13.6  HCT 37.8* 38.1*  MCV 88.3 88.4  PLT 267 243     Studies/Results: Echocardiogram:moderate LVH. Systolic function was moderately to severely reduced. The estimated ejection fraction was in the range of 30% to 35%. Doppler parameters are consistent with a reversible restrictive pattern, indicative of decreased left ventricular diastolic compliance and/or increased left atrial pressure (grade 3 diastolic dysfunction).    Medications: Scheduled Meds:    . calcitRIOL  0.25 mcg Oral Daily  . carvedilol  25 mg Oral BID WC  . enoxaparin  30 mg Subcutaneous Q24H  . ferumoxytol  510 mg Intravenous Once  . folic acid  1 mg Oral Daily  . hydrALAZINE  50 mg Oral BID  . multivitamins ther. w/minerals  1 tablet Oral Daily  . sodium chloride  3 mL Intravenous Q12H  . thiamine  100 mg Oral Daily  . DISCONTD: amLODipine  10 mg Oral Daily  . DISCONTD: cloNIDine  0.1 mg Oral BID  . DISCONTD: potassium chloride  40 mEq Oral BID      PRN Meds:.acetaminophen, hydrALAZINE, labetalol, LORazepam, LORazepam, ondansetron (ZOFRAN) IV, sodium chloride  Assessment/Plan: Patient Active Hospital Problem List:  Patient is [name] with no past medical history only because he has not seen a primary care physician ever. He came in complaining of acute shortness of breath and was found to have acute congestive heart failure, severe renal dysfunction which appears to be more chronic than acute with a creatinine of 4.7 and hypertensive urgency with a systolic blood pressure greater than 200. Patient was initially admitted and placed in the step down unit. He started aggressive diuresis and blood pressure control. A 2-D echo was ordered and nephrology and cardiology were consulted.  Alcohol withdrawal delirium - vitamins, ativan prn. Still not resolved   Hypertensive emergency (09/22/2011) - RESOLVED   HTN stage III - controlled . Had to stop clonidine due to hypotension.   Acute systolic and diastolic CHF - patient was diuresed to normovolemia. Now c/w BB. No ACEI due to renal failure       LOS: 6 days   Lucella Pommier 09/27/2011, 5:30 PM

## 2011-09-28 LAB — CREATININE, SERUM
Creatinine, Ser: 4.95 mg/dL — ABNORMAL HIGH (ref 0.50–1.35)
GFR calc Af Amer: 14 mL/min — ABNORMAL LOW (ref >90)

## 2011-09-28 NOTE — Progress Notes (Signed)
Subjective: Remains confused. Does not know he is in the hospital. Unable to tell me reason he came to the ED.   Objective: Weight change: 0 kg (0 lb)  Intake/Output Summary (Last 24 hours) at 09/28/11 1750 Last data filed at 09/28/11 0900  Gross per 24 hour  Intake    680 ml  Output      0 ml  Net    680 ml   BP 119/73  Pulse 58  Temp(Src) 98.2 F (36.8 C) (Oral)  Resp 18  Ht 6' (1.829 m)  Wt 86.818 kg (191 lb 6.4 oz)  BMI 25.96 kg/m2  SpO2 98% Alert oriented to himself Does not know why he is in the hospital Chest clear to auscultation Heart regular rate and rhythm without murmurs rubs or gallops Abdomen soft nontender  Lab Results: Basic Metabolic Panel:  Basename 09/28/11 0640 09/27/11 0530 09/26/11 0555  NA -- 136 136  K -- 3.6 3.1*  CL -- 91* 90*  CO2 -- 30 32  GLUCOSE -- 96 91  BUN -- 69* 65*  CREATININE 4.95* 5.08* --  CALCIUM -- 9.5 9.6  MG -- 2.2 --  PHOS -- 4.3 4.5   CBC:  Basename 09/27/11 0530 09/26/11 0555  WBC 4.8 4.9  NEUTROABS -- --  HGB 13.4 13.6  HCT 37.8* 38.1*  MCV 88.3 88.4  PLT 267 243     Studies/Results: Echocardiogram:moderate LVH. Systolic function was moderately to severely reduced. The estimated ejection fraction was in the range of 30% to 35%. Doppler parameters are consistent with a reversible restrictive pattern, indicative of decreased left ventricular diastolic compliance and/or increased left atrial pressure (grade 3 diastolic dysfunction).    Medications: Scheduled Meds:    . calcitRIOL  0.25 mcg Oral Daily  . carvedilol  25 mg Oral BID WC  . enoxaparin  30 mg Subcutaneous Q24H  . ferumoxytol  510 mg Intravenous Once  . folic acid  1 mg Oral Daily  . hydrALAZINE  50 mg Oral BID  . multivitamins ther. w/minerals  1 tablet Oral Daily  . sodium chloride  3 mL Intravenous Q12H  . thiamine  100 mg Oral Daily      PRN Meds:.acetaminophen, hydrALAZINE, labetalol, LORazepam, LORazepam, ondansetron (ZOFRAN)  IV, sodium chloride  Assessment/Plan: Patient Active Hospital Problem List:  Patient is [name] with no past medical history only because he has not seen a primary care physician ever. He came in complaining of acute shortness of breath and was found to have acute congestive heart failure, severe renal dysfunction which appears to be more chronic than acute with a creatinine of 4.7 and hypertensive urgency with a systolic blood pressure greater than 200. Patient was initially admitted and placed in the step down unit. He started aggressive diuresis and blood pressure control. A 2-D echo was ordered and nephrology and cardiology were consulted.  Alcohol withdrawal delirium - vitamins, ativan prn. Still not resolved   Hypertensive emergency (09/22/2011) - RESOLVED   HTN stage III - controlled . Had to stop clonidine due to hypotension.   Acute systolic and diastolic CHF - patient was diuresed to normovolemia. Now c/w BB. No ACEI due to renal failure       LOS: 7 days   Kenneth [name] 09/28/2011, 5:50 PM

## 2011-09-28 NOTE — Progress Notes (Signed)
CSW covering for the weekend attempted to visit with the pt to complete psychosocial assessment and SBIRT. Pt opened his eyes when CSW spoke but did not engage in conversation. Pt has a sitter in the room who reports the pt was awake earlier and walking around. CSW assigned will follow up with the pt. Luane School, LCSWA 09/28/2011 3:09 PM

## 2011-09-29 LAB — BASIC METABOLIC PANEL
GFR calc non Af Amer: 11 mL/min — ABNORMAL LOW (ref >90)
Glucose, Bld: 94 mg/dL (ref 70–99)
Potassium: 3.3 mEq/L — ABNORMAL LOW (ref 3.5–5.1)
Sodium: 135 mEq/L (ref 135–145)

## 2011-09-29 LAB — CBC
Hemoglobin: 13 g/dL (ref 13.0–17.0)
MCH: 30 pg (ref 26.0–34.0)
Platelets: 272 10*3/uL (ref 150–400)
RBC: 4.33 MIL/uL (ref 4.22–5.81)
WBC: 4.5 10*3/uL (ref 4.0–10.5)

## 2011-09-29 NOTE — Progress Notes (Signed)
Subjective: Less confused today Objective: Weight change: 0.181 kg (6.4 oz)  Intake/Output Summary (Last 24 hours) at 09/29/11 1713 Last data filed at 09/29/11 1103  Gross per 24 hour  Intake    243 ml  Output    360 ml  Net   -117 ml   BP 140/88  Pulse 71  Temp(Src) 98.1 F (36.7 C) (Oral)  Resp 22  Ht 6' (1.829 m)  Wt 87 kg (191 lb 12.8 oz)  BMI 26.01 kg/m2  SpO2 97% Alert oriented to himself Does not know why he is in the hospital Chest clear to auscultation Heart regular rate and rhythm without murmurs rubs or gallops Abdomen soft nontender  Lab Results: Basic Metabolic Panel:  Basename 09/29/11 0605 09/28/11 0640 09/27/11 0530  NA 135 -- 136  K 3.3* -- 3.6  CL 93* -- 91*  CO2 29 -- 30  GLUCOSE 94 -- 96  BUN 73* -- 69*  CREATININE 5.08* 4.95* --  CALCIUM 9.3 -- 9.5  MG -- -- 2.2  PHOS -- -- 4.3   CBC:  Basename 09/29/11 0605 09/27/11 0530  WBC 4.5 4.8  NEUTROABS -- --  HGB 13.0 13.4  HCT 38.4* 37.8*  MCV 88.7 88.3  PLT 272 267     Studies/Results: Echocardiogram:moderate LVH. Systolic function was moderately to severely reduced. The estimated ejection fraction was in the range of 30% to 35%. Doppler parameters are consistent with a reversible restrictive pattern, indicative of decreased left ventricular diastolic compliance and/or increased left atrial pressure (grade 3 diastolic dysfunction).    Medications: Scheduled Meds:    . calcitRIOL  0.25 mcg Oral Daily  . carvedilol  25 mg Oral BID WC  . enoxaparin  30 mg Subcutaneous Q24H  . ferumoxytol  510 mg Intravenous Once  . folic acid  1 mg Oral Daily  . hydrALAZINE  50 mg Oral BID  . multivitamins ther. w/minerals  1 tablet Oral Daily  . sodium chloride  3 mL Intravenous Q12H  . thiamine  100 mg Oral Daily      PRN Meds:.acetaminophen, hydrALAZINE, labetalol, ondansetron (ZOFRAN) IV, sodium chloride  Assessment/Plan:  Patient is [name] with no past  medical history only because he has not seen a primary care physician ever. He came in complaining of acute shortness of breath and was found to have acute congestive heart failure, severe renal dysfunction which appears to be more chronic than acute with a creatinine of 4.7 and hypertensive urgency with a systolic blood pressure greater than 200. Patient was initially admitted and placed in the step down unit. He started aggressive diuresis and blood pressure control. A 2-D echo was ordered and nephrology and cardiology were consulted.  Patient now has CKD stage IV - will have close F/u as outpatient   Alcohol withdrawal delirium - vitamins, ativan prn. resolving  Hypertensive emergency (09/22/2011) - RESOLVED   HTN stage III - controlled . Had to stop clonidine due to hypotension.   Acute systolic and diastolic CHF - patient was diuresed to normovolemia. Now c/w BB. No ACEI due to renal failure       LOS: 8 days   Caelin Rayl 09/29/2011, 5:13 PM

## 2011-09-29 NOTE — Progress Notes (Signed)
Subjective: Interval History: has no complaint of SOB Nausea or appetite loss.  Objective: Vital signs in last 24 hours:  Temp:  [98.2 F (36.8 C)-98.6 F (37 C)] 98.3 F (36.8 C) (12/03 0511) Pulse Rate:  [58-76] 73  (12/03 0511) Resp:  [18-20] 20  (12/03 0511) BP: (119-143)/(73-98) 143/84 mmHg (12/03 0511) SpO2:  [95 %-99 %] 95 % (12/03 0511) Weight:  [87 kg (191 lb 12.8 oz)] 191 lb 12.8 oz (87 kg) (12/03 0511)  Weight change: 0.181 kg (6.4 oz)  Intake/Output: I/O last 3 completed shifts: In: 920 [P.O.:920] Out: 360 [Urine:360]   Intake/Output this shift:    General: awake, no distress, vague in his responses  Heart: reg without rub or S3  Lungs: CTA bilaterally  Abdomen: soft, nontender  Extremities: no edema  Neuro: no focal deficits    Lab Results:  Basename 09/27/11 0530  WBC 4.8  HGB 13.4  HCT 37.8*  PLT 267   BMET  Basename 09/29/11 0605 09/28/11 0640 09/27/11 0530  NA 135 -- 136  K 3.3* -- 3.6  CL 93* -- 91*  CO2 29 -- 30  GLUCOSE 94 -- 96  BUN 73* -- 69*  CREATININE 5.08* 4.95* 5.08*  CALCIUM 9.3 -- 9.5  PHOS -- -- 4.3   LFT  Basename 09/27/11 0530  PROT --  ALBUMIN 3.1*  AST --  ALT --  ALKPHOS --  BILITOT --  BILIDIR --  IBILI --   PT/INR No results found for this basename: LABPROT:2,INR:2 in the last 72 hours Hepatitis Panel No results found for this basename: HEPBSAG,HCVAB,HEPAIGM,HEPBIGM in the last 72 hours  Studies/Results: No results found.  I have reviewed the patient's current medications.  Assessment/Plan: 1. CKD - stage IV/V, creatinine in high 4 to low 5's. No indication for acute dialysis. Suspect HTN renal damage from years of poorly treated BP. SPEP and UPEP are pending. US show 10 cm kidneys highly echogenic. No new recommendations, will see again on Monday.  2. AMS -- etoh withdrawal on CIWA protocol per primary.  3. HTN -- good control on current regimen.  4. Secondary hyperparathyroidism - vit D  5. Anemia  - Hb 13's, noESA.  6. Lytes/Nutrition - stable.  Patiernt will need ESRD planning and [facility] appointment for access as out patient. He can follow up with Dr Hassell Done in several weeks      LOS: 8 Sama Arauz W @TODAY @7 :59 AM

## 2011-09-30 ENCOUNTER — Encounter (HOSPITAL_COMMUNITY): Payer: Self-pay | Admitting: Anesthesiology

## 2011-09-30 ENCOUNTER — Encounter (HOSPITAL_COMMUNITY)
Admission: EM | Disposition: A | Discharge status: Home / Self Care | Payer: Self-pay | Source: Ambulatory Visit | Attending: Internal Medicine

## 2011-09-30 LAB — CBC
HCT: 38.1 % — ABNORMAL LOW (ref 39.0–52.0)
Hemoglobin: 13.3 g/dL (ref 13.0–17.0)
MCH: 30.7 pg (ref 26.0–34.0)
MCV: 88 fL (ref 78.0–100.0)
RBC: 4.33 MIL/uL (ref 4.22–5.81)
WBC: 4.3 10*3/uL (ref 4.0–10.5)

## 2011-09-30 LAB — BASIC METABOLIC PANEL
CO2: 26 mEq/L (ref 19–32)
Calcium: 9.6 mg/dL (ref 8.4–10.5)
Chloride: 95 mEq/L — ABNORMAL LOW (ref 96–112)
Glucose, Bld: 97 mg/dL (ref 70–99)
Potassium: 3.3 mEq/L — ABNORMAL LOW (ref 3.5–5.1)
Sodium: 135 mEq/L (ref 135–145)

## 2011-09-30 SURGERY — ECHOCARDIOGRAM, TRANSESOPHAGEAL
Anesthesia: Moderate Sedation

## 2011-09-30 SURGERY — ECHOCARDIOGRAM, TRANSESOPHAGEAL
Anesthesia: General

## 2011-09-30 MED ORDER — FOLIC ACID 1 MG PO TABS: 1.0000 mg | ORAL_TABLET | Freq: Every day | ORAL | Status: AC

## 2011-09-30 MED ORDER — THIAMINE HCL 100 MG PO TABS: 100.0000 mg | ORAL_TABLET | Freq: Every day | ORAL | Status: AC

## 2011-09-30 MED ORDER — HYDRALAZINE HCL 25 MG PO TABS: 25.0000 mg | ORAL_TABLET | Freq: Two times a day (BID) | ORAL | Status: DC

## 2011-09-30 MED ORDER — FUROSEMIDE 80 MG PO TABS: 40.0000 mg | ORAL_TABLET | Freq: Every day | ORAL | Status: DC

## 2011-09-30 NOTE — Progress Notes (Signed)
Utilization review complete 

## 2011-09-30 NOTE — Progress Notes (Signed)
Clinical Social Work, 09/30/11, 1322:  CSW completed full assessment and placed in shadow chart.  Pt refused to complete SBIRT and did not want any resources or support with ETOH abuse.  Patient stated he did not have a problem with drinking.  CSW will follow up as needed.[place], [place], [place]

## 2011-09-30 NOTE — Discharge Summary (Signed)
Physician Discharge Summary  Patient ID: Kenneth [name] MRN: [id number] DOB/AGE: April 19, 1956 55 y.o. Primary Care Physician:No primary provider on file. Admit date: 09/21/2011 Discharge date: 09/30/2011    Discharge Diagnoses:  Hypertensive emergency-resolved Chronic kidney disease stage IV due to  hypertensive nephrosclerosis Alcohol withdrawal delirium - resolved  Obesity, morbid  CKD (chronic kidney disease), stage IV  Systolic and diastolic CHF, acute on chronic   Discharge Medication List as of 09/30/2011  2:36 PM    START taking these medications   Details  amLODipine (NORVASC) 10 MG tablet Take 1 tablet (10 mg total) by mouth daily., Starting 09/24/2011, Until Thu 09/23/12, Print    calcitRIOL (ROCALTROL) 0.25 MCG capsule Take 1 capsule (0.25 mcg total) by mouth daily., Starting 09/24/2011, Until Thu 09/23/12, Print    carvedilol (COREG) 25 MG tablet Take 1 tablet (25 mg total) by mouth 2 (two) times daily with a meal., Starting 09/24/2011, Until Thu [id number], Print    folic acid (FOLVITE) 1 MG tablet Take 1 tablet (1 mg total) by mouth daily., Starting 09/30/2011, Until Wed 09/29/12, Print    thiamine 100 MG tablet Take 1 tablet (100 mg total) by mouth daily., Starting 09/30/2011, Until Wed 09/29/12, Print      CONTINUE these medications which have CHANGED   Details  furosemide (LASIX) 80 MG tablet Take 0.5 tablets (40 mg total) by mouth daily., Starting 09/30/2011, Until Wed 09/29/12, Print    hydrALAZINE (APRESOLINE) 25 MG tablet Take 1 tablet (25 mg total) by mouth 2 (two) times daily., Starting 09/30/2011, Until Wed 09/29/12, Print      STOP taking these medications     guaiFENesin (MUCINEX) 600 MG 12 hr tablet      Phenylephrine-Acetaminophen (VICKS DAYQUIL SINEX PO)      cloNIDine (CATAPRES) 0.1 MG tablet         Discharged Condition: Good    Consults: [facility] and [facility]  Significant Diagnostic Studies: Dg Chest 2  View  09/21/2011  *RADIOLOGY REPORT*  Clinical Data: Short of breath.  Hypertension.  CHF.  CHEST - 2 VIEW  Comparison: None.  Findings: Cardiomegaly.  Interstitial pulmonary edema.  Pulmonary vascular congestion.  Small bilateral pleural effusions.  Thickening of the fissures is present on the lateral view.  IMPRESSION: Mild CHF.  Original Report Authenticated By: Dereck Ligas, M.D.   US Renal  09/22/2011  *RADIOLOGY REPORT*  Clinical Data: Renal failure.  RENAL/URINARY TRACT ULTRASOUND COMPLETE  Comparison:  None.  Findings:  Right Kidney:  10.3 cm.  Increased echotexture.  No focal abnormality or hydronephrosis.  Left Kidney:  9.8 cm.  Increased echotexture.  No focal abnormality or hydronephrosis.  Bladder:  Normal.  Incidentally noted are bilateral pleural effusions and a small amount of perisplenic ascites.  IMPRESSION: Increased echotexture within the kidneys suggesting chronic medical renal disease.  No hydronephrosis.  Bilateral effusions, small perisplenic ascites.  Original Report Authenticated By: Raelyn Number, M.D.    Lab Results: Basic Metabolic Panel:  Basename 09/30/11 0649 09/29/11 0605  NA 135 135  K 3.3* 3.3*  CL 95* 93*  CO2 26 29  GLUCOSE 97 94  BUN 70* 73*  CREATININE 4.70* 5.08*  CALCIUM 9.6 9.3  MG -- --  PHOS -- --   Liver Function Tests: No results found for this basename: AST:2,ALT:2,ALKPHOS:2,BILITOT:2,PROT:2,ALBUMIN:2 in the last 72 hours   CBC:  Basename 09/30/11 0649 09/29/11 0605  WBC 4.3 4.5  NEUTROABS -- --  HGB 13.3 13.0  HCT 38.1* 38.4*  MCV 88.0 88.7  PLT 249 272    Recent Results (from the past 240 hour(s))  MRSA PCR SCREENING     Status: Normal   Collection Time   09/21/11  9:45 PM      Component Value Range Status Comment   MRSA by PCR NEGATIVE  NEGATIVE  Final     2D echo: Study Conclusions  - Left ventricle: The cavity size was normal. Wall thickness was increased in a pattern of moderate LVH. Systolic function was  moderately to severely reduced. The estimated ejection fraction was in the range of 30% to 35%. Doppler parameters are consistent with a reversible restrictive pattern, indicative of decreased left ventricular diastolic compliance and/or increased left atrial pressure (grade 3 diastolic dysfunction). Doppler parameters are consistent with high ventricular filling pressure. - Mitral valve: Mild regurgitation. - Left atrium: The atrium was moderately to severely dilated. - Right ventricle: The cavity size was mildly dilated. - Atrial septum: A patent foramen ovale cannot be excluded. - Tricuspid valve: Mild-moderate regurgitation. - Pericardium, extracardiac: A small to moderate pericardial effusion was identified posterior to the heart. Features were not consistent with tamponade physiology.     Hospital Course: Mr. Juaire is a 55 year old gentleman with known hypertension was admitted from the emergency room on September 21, 2011 with worsening dyspnea. He was found to have clinical CHF type symptoms and worsening renal failure. His blood pressure readings were also extremely elevated. The assessment cause for malignant hypertension and acute decompensated CHF. Further evaluation with the renal ultrasound, echocardiogram, serum protein electrophoresis lead to the conclusion that the patient was having chronic disease stage IV and cardiomyopathy related to untreated hypertension. The patient was seen in consultation by [facility] and Dr. wall from cardiology. Recommendations were made to treat the patient intravenous furosemide and follow him closely. The patient continued to have  good urine output and his creatinine stabilized around 4.8. He did not require hemodialysis.  On hospital day #3 the patient became slightly more confused and he urinated on the floor and started wandering into other patient's rooms. Family reported that he drinks heavily each night. Diagnoses of  delirium tremens was made and the patient was treated with as needed doses of benzodiazepines. Thiamine and folic acid was given also. A sitter was placed at the bedside to assure that the patient does not fall. Eventually the patient stabilized and he was able to cooperate with exam.  Initially the patient's blood pressure was difficult to control. He required initially high doses of clonidine. As he recovered from his delirium tremens we were able to discontinue the clonidine. He was discharged home on Norvasc Coreg Lasix and hydralazine. We have avoided ACE inhibitors and angiotensin receptor blockers due to the fragile nature of the renal cardiac balance.  Discharge Exam: Blood pressure 123/74, pulse 75, temperature 98.4 F (36.9 C), temperature source Oral, resp. rate 16, height 6' (1.829 m), weight 86.501 kg (190 lb 11.2 oz), SpO2 97.00%. Alert oriented calm and cooperative Chest clear to auscultation without wheeze rhonchi crackles Heart regular rate and rhythm without murmurs rubs gallops Abdomen soft nontender bowel sounds are present Neurologically intact  Disposition: Home. Time spent in discharging the patient 45 minutes  Discharge Orders    Future Appointments: Provider: Department: Dept Phone: Center:   10/09/2011 9:30 AM Liliane Shi, [place] [phone] [id number]     Future Orders Please Complete By Expires   Diet - low sodium heart healthy      (  HEART FAILURE PATIENTS) Call MD:  Anytime you have any of the following symptoms: 1) 3 pound weight gain in 24 hours or 5 pounds in 1 week 2) shortness of breath, with or without a dry hacking cough 3) swelling in the hands, feet or stomach 4) if you have to sleep on extra pillows at night in order to breathe.      Increase activity slowly      (HEART FAILURE PATIENTS) Call MD:  Anytime you have any of the following symptoms: 1) 3 pound weight gain in 24 hours or 5 pounds in 1 week 2) shortness of breath, with or  without a dry hacking cough 3) swelling in the hands, feet or stomach 4) if you have to sleep on extra pillows at night in order to breathe.      (HEART FAILURE PATIENTS) Call MD:  Anytime you have any of the following symptoms: 1) 3 pound weight gain in 24 hours or 5 pounds in 1 week 2) shortness of breath, with or without a dry hacking cough 3) swelling in the hands, feet or stomach 4) if you have to sleep on extra pillows at night in order to breathe.         Follow-up Information    Follow up with Geoffry Paradise, MD. Make an appointment in 1 month.   Contact information:   [place] [phone]       Follow up with Jenell Milliner, MD on 10/09/2011. (9:30)    Contact information:   [facility] [place] [phone]       Follow up with [facility] on 10/01/2011. (appointment for 10:45 am with [name] NP- please bring insurance card, copay , and photo ID with you- appointment to establish primary MD)    Contact information:   [address] [place],  [address] [phone]         Signed: Edythe Lynn 09/30/2011, 5:30 PM

## 2011-10-02 NOTE — Progress Notes (Signed)
This patient was discussed at long LOS rounds 12.05.12

## 2011-10-08 ENCOUNTER — Encounter: Payer: Self-pay | Admitting: *Deleted

## 2011-10-09 ENCOUNTER — Encounter: Payer: Self-pay | Admitting: Physician Assistant

## 2011-10-09 ENCOUNTER — Encounter: Payer: BC Managed Care – PPO | Admitting: Physician Assistant

## 2011-10-09 ENCOUNTER — Ambulatory Visit (INDEPENDENT_AMBULATORY_CARE_PROVIDER_SITE_OTHER): Payer: BC Managed Care – PPO | Admitting: Physician Assistant

## 2011-10-09 VITALS — BP 160/100 | HR 64 | Ht 71.0 in | Wt 220.8 lb

## 2011-10-09 DIAGNOSIS — I429 Cardiomyopathy, unspecified: Secondary | ICD-10-CM

## 2011-10-09 DIAGNOSIS — N184 Chronic kidney disease, stage 4 (severe): Secondary | ICD-10-CM

## 2011-10-09 DIAGNOSIS — I10 Essential (primary) hypertension: Secondary | ICD-10-CM

## 2011-10-09 DIAGNOSIS — I5043 Acute on chronic combined systolic (congestive) and diastolic (congestive) heart failure: Secondary | ICD-10-CM

## 2011-10-09 NOTE — Assessment & Plan Note (Signed)
He has followup pending with Dr. Hassell Done in January.  We discussed the dangers of untreated hypertension which includes end-stage renal disease and hemodialysis.

## 2011-10-09 NOTE — Patient Instructions (Addendum)
Your physician recommends that you schedule a follow-up appointment in: one week with Richardson Dopp PA-C.  If you get an appt with your primary physician, you can call and cancel this appt and reschedule it in 3 weeks instead.  Your physician wants you to follow-up in: 3 months.   You will receive a reminder letter in the mail two months in advance. If you don't receive a letter, please call our office to schedule the follow-up appointment.  Weigh daily and call if:  Weight up 3 lbs in one day, increased swelling or increased dyspnea.

## 2011-10-09 NOTE — Assessment & Plan Note (Signed)
Likely hypertensive heart disease and possibly alcohol induced cardiomyopathy.  We do not plan ischemic evaluation at this time as he would not be a candidate for cardiac catheterization unless it is an emergency.  Followup with Dr. Verl Blalock in 3 months.

## 2011-10-09 NOTE — Progress Notes (Signed)
[place], [place]  [address] Phone: [phone] Fax:  [phone]  Date:  10/09/2011   Name:  Kenneth [name]       DOB:  Nov 08, 1955 MRN:  [id number]  PCP:  [name], NP Primary Cardiologist:  Dr. Jenell Milliner  Primary Electrophysiologist:  none   History of Present Illness: Kenneth [name] is a 55 y.o. male who presents for post hospital follow up.  He was admitted 11/25-12/4 with acute congestive heart failure and acute renal failure in the setting of hypertensive emergency.  He apparently had a history of hypertension which was untreated.  Echocardiogram 09/22/11: Moderate LVH, EF 99991111, grade 3 diastolic dysfunction, mild MR, moderate to severe LAE, mild RVE, mild to moderate TR, small to moderate pericardial effusion.  The patient also developed symptoms consistent with alcohol withdrawal during his hospitalization.  He was diagnosed with stage IV chronic kidney disease.  He was seen by nephrology.  It was felt his chronic renal failure was likely secondary to untreated hypertension.  He has followup pending with nephrology.  Overall, it was felt that his cardiomyopathy is related to hypertensive heart disease.  Labs: Potassium 3.3, creatinine 4.71, hemoglobin 13.3, cardiac enzymes negative, TSH 0.497, HIV nonreactive.  He comes in today with his daughter.  He did not take his medications yet today.  He thought he should hold them because of being seen at the doctor's office.  I had a long discussion with the patient and his daughter regarding the importance of taking all of his medications on a daily basis.  I gave him his clonidine, carvedilol, norvasc and hydralazine in the office and his BP came down to 160/110.  The patient denies chest pain, shortness of breath, syncope, orthopnea, PND or significant pedal edema.  He feels, overall, much better.    Past Medical History  Diagnosis Date  . Hypertension   . CKD (chronic kidney disease) stage 4, GFR  15-29 ml/min     renal failure; due to hypertensive nephrosclerosis  . Chronic combined systolic and diastolic heart failure     Echocardiogram 09/22/11: Moderate LVH, EF 99991111, grade 3 diastolic dysfunction, mild MR, moderate to severe LAE, mild RVE, mild to moderate TR, small to moderate pericardial effusion  . Cardiomyopathy secondary     likely related to HTN heart disease; possibly ETOH related as well  . History of alcohol abuse     Current Outpatient Prescriptions  Medication Sig Dispense Refill  . amLODipine (NORVASC) 10 MG tablet Take 1 tablet (10 mg total) by mouth daily.  30 tablet  1  . calcitRIOL (ROCALTROL) 0.25 MCG capsule Take 1 capsule (0.25 mcg total) by mouth daily.  30 capsule  1  . carvedilol (COREG) 25 MG tablet Take 1 tablet (25 mg total) by mouth 2 (two) times daily with a meal.  60 tablet  1  . cloNIDine (CATAPRES) 0.1 MG tablet Take 0.1 mg by mouth 3 (three) times daily.        . folic acid (FOLVITE) 1 MG tablet Take 1 tablet (1 mg total) by mouth daily.  30 tablet  0  . furosemide (LASIX) 80 MG tablet Take 80 mg by mouth daily.        . hydrALAZINE (APRESOLINE) 25 MG tablet Take 1 tablet (25 mg total) by mouth 2 (two) times daily.  60 tablet  0  . thiamine 100 MG tablet Take 1 tablet (100 mg total) by mouth daily.  30 tablet  0  . Vitamins-Lipotropics (VIT BALANCED B-100 PO) Take 100 mg by mouth daily.          Allergies: No Known Allergies  History  Substance Use Topics  . Smoking status: Former Research scientist (life sciences)  . Smokeless tobacco: Current User  . Alcohol Use: 1.8 oz/week    3 Cans of beer per week     occ     ROS:  Please see the history of present illness.    All other systems reviewed and negative.   PHYSICAL EXAM: VS:  BP 200/130  Pulse 68  Ht 5\' 11"  (1.803 m)  Wt 220 lb 12.8 oz (100.154 kg)  BMI 30.80 kg/m2 Well nourished, well developed, in no acute distress HEENT: normal Neck: no JVD Cardiac:  normal S1, S2; RRR; no murmur Lungs:  clear to  auscultation bilaterally, no wheezing, rhonchi or rales Abd: soft, nontender, no hepatomegaly Ext: no edema Skin: warm and dry Neuro:  CNs 2-12 intact, no focal abnormalities noted  EKG:   Sinus rhythm, heart rate 71, normal axis, nonspecific ST-T wave changes, LVH  ASSESSMENT AND PLAN:

## 2011-10-09 NOTE — Assessment & Plan Note (Signed)
Uncontrolled.  He did not take any of his medications today.  I gave him his medications as noted and his blood pressure did come down.  It is still not optimal.  He will be set up for followup with me in one week.  His daughter notes that he did plan to followup with his new PCP next week.  If he gets an appointment next week, he can cancel the appointment with me.

## 2011-10-09 NOTE — Assessment & Plan Note (Addendum)
Overall, volume is stable.  Continue current medications.  I filled out disability paperwork for him today.  From a cardiac standpoint, he should be able to return to work on or after 10/29/11.

## 2011-10-16 ENCOUNTER — Ambulatory Visit: Payer: BC Managed Care – PPO | Admitting: Physician Assistant

## 2011-11-08 ENCOUNTER — Other Ambulatory Visit: Payer: Self-pay | Admitting: Internal Medicine

## 2011-12-08 ENCOUNTER — Other Ambulatory Visit: Payer: Self-pay | Admitting: Internal Medicine

## 2013-12-08 ENCOUNTER — Encounter (HOSPITAL_COMMUNITY): Payer: Self-pay | Admitting: Emergency Medicine

## 2013-12-08 ENCOUNTER — Ambulatory Visit (INDEPENDENT_AMBULATORY_CARE_PROVIDER_SITE_OTHER): Payer: BC Managed Care – PPO | Admitting: Family Medicine

## 2013-12-08 ENCOUNTER — Emergency Department (HOSPITAL_COMMUNITY): Payer: BC Managed Care – PPO

## 2013-12-08 ENCOUNTER — Inpatient Hospital Stay (HOSPITAL_COMMUNITY)
Admission: EM | Admit: 2013-12-08 | Discharge: 2013-12-10 | DRG: 682 | Disposition: A | Discharge status: Home / Self Care | Payer: BC Managed Care – PPO | Attending: Internal Medicine | Admitting: Internal Medicine

## 2013-12-08 VITALS — BP 240/140 | HR 72 | Temp 98.2°F | Resp 18 | Ht 70.5 in | Wt 204.0 lb

## 2013-12-08 DIAGNOSIS — Z9119 Patient's noncompliance with other medical treatment and regimen: Secondary | ICD-10-CM

## 2013-12-08 DIAGNOSIS — I509 Heart failure, unspecified: Secondary | ICD-10-CM | POA: Diagnosis present

## 2013-12-08 DIAGNOSIS — I429 Cardiomyopathy, unspecified: Secondary | ICD-10-CM | POA: Diagnosis present

## 2013-12-08 DIAGNOSIS — Z91199 Patient's noncompliance with other medical treatment and regimen due to unspecified reason: Secondary | ICD-10-CM

## 2013-12-08 DIAGNOSIS — I161 Hypertensive emergency: Secondary | ICD-10-CM | POA: Diagnosis present

## 2013-12-08 DIAGNOSIS — R42 Dizziness and giddiness: Secondary | ICD-10-CM

## 2013-12-08 DIAGNOSIS — Z87891 Personal history of nicotine dependence: Secondary | ICD-10-CM

## 2013-12-08 DIAGNOSIS — E785 Hyperlipidemia, unspecified: Secondary | ICD-10-CM | POA: Diagnosis present

## 2013-12-08 DIAGNOSIS — I120 Hypertensive chronic kidney disease with stage 5 chronic kidney disease or end stage renal disease: Principal | ICD-10-CM | POA: Diagnosis present

## 2013-12-08 DIAGNOSIS — I160 Hypertensive urgency: Secondary | ICD-10-CM

## 2013-12-08 DIAGNOSIS — F10231 Alcohol dependence with withdrawal delirium: Secondary | ICD-10-CM

## 2013-12-08 DIAGNOSIS — I10 Essential (primary) hypertension: Secondary | ICD-10-CM

## 2013-12-08 DIAGNOSIS — D638 Anemia in other chronic diseases classified elsewhere: Secondary | ICD-10-CM | POA: Diagnosis present

## 2013-12-08 DIAGNOSIS — I5031 Acute diastolic (congestive) heart failure: Secondary | ICD-10-CM | POA: Diagnosis present

## 2013-12-08 DIAGNOSIS — I5043 Acute on chronic combined systolic (congestive) and diastolic (congestive) heart failure: Secondary | ICD-10-CM

## 2013-12-08 DIAGNOSIS — R9431 Abnormal electrocardiogram [ECG] [EKG]: Secondary | ICD-10-CM

## 2013-12-08 DIAGNOSIS — F10931 Alcohol use, unspecified with withdrawal delirium: Secondary | ICD-10-CM

## 2013-12-08 DIAGNOSIS — N184 Chronic kidney disease, stage 4 (severe): Secondary | ICD-10-CM

## 2013-12-08 DIAGNOSIS — N185 Chronic kidney disease, stage 5: Secondary | ICD-10-CM | POA: Diagnosis present

## 2013-12-08 LAB — URINE MICROSCOPIC-ADD ON

## 2013-12-08 LAB — URINALYSIS, ROUTINE W REFLEX MICROSCOPIC
Bilirubin Urine: NEGATIVE
Glucose, UA: NEGATIVE mg/dL
KETONES UR: NEGATIVE mg/dL
Leukocytes, UA: NEGATIVE
Nitrite: NEGATIVE
PH: 6 (ref 5.0–8.0)
Protein, ur: 100 mg/dL — AB
Specific Gravity, Urine: 1.02 (ref 1.005–1.030)
UROBILINOGEN UA: 0.2 mg/dL (ref 0.0–1.0)

## 2013-12-08 LAB — POCT I-STAT TROPONIN I: Troponin i, poc: 0.04 ng/mL (ref 0.00–0.08)

## 2013-12-08 LAB — TROPONIN I

## 2013-12-08 LAB — CBC WITH DIFFERENTIAL/PLATELET
Basophils Absolute: 0 10*3/uL (ref 0.0–0.1)
Basophils Relative: 1 % (ref 0–1)
EOS ABS: 0.3 10*3/uL (ref 0.0–0.7)
EOS PCT: 6 % — AB (ref 0–5)
HCT: 33.9 % — ABNORMAL LOW (ref 39.0–52.0)
HEMOGLOBIN: 11.7 g/dL — AB (ref 13.0–17.0)
Lymphocytes Relative: 34 % (ref 12–46)
Lymphs Abs: 1.7 10*3/uL (ref 0.7–4.0)
MCH: 31.3 pg (ref 26.0–34.0)
MCHC: 34.5 g/dL (ref 30.0–36.0)
MCV: 90.6 fL (ref 78.0–100.0)
MONOS PCT: 10 % (ref 3–12)
Monocytes Absolute: 0.5 10*3/uL (ref 0.1–1.0)
NEUTROS PCT: 50 % (ref 43–77)
Neutro Abs: 2.5 10*3/uL (ref 1.7–7.7)
Platelets: 216 10*3/uL (ref 150–400)
RBC: 3.74 MIL/uL — ABNORMAL LOW (ref 4.22–5.81)
RDW: 15.2 % (ref 11.5–15.5)
WBC: 5.1 10*3/uL (ref 4.0–10.5)

## 2013-12-08 LAB — BASIC METABOLIC PANEL
BUN: 29 mg/dL — AB (ref 6–23)
CALCIUM: 9 mg/dL (ref 8.4–10.5)
CO2: 22 mEq/L (ref 19–32)
Chloride: 105 mEq/L (ref 96–112)
Creatinine, Ser: 5.1 mg/dL — ABNORMAL HIGH (ref 0.50–1.35)
GFR, EST AFRICAN AMERICAN: 13 mL/min — AB (ref >90)
GFR, EST NON AFRICAN AMERICAN: 11 mL/min — AB (ref >90)
GLUCOSE: 80 mg/dL (ref 70–99)
Potassium: 3.8 mEq/L (ref 3.7–5.3)
Sodium: 143 mEq/L (ref 137–147)

## 2013-12-08 LAB — PHOSPHORUS: Phosphorus: 4.5 mg/dL (ref 2.3–4.6)

## 2013-12-08 LAB — PRO B NATRIURETIC PEPTIDE
PRO B NATRI PEPTIDE: 5234 pg/mL — AB (ref 0–125)
Pro B Natriuretic peptide (BNP): 5715 pg/mL — ABNORMAL HIGH (ref 0–125)

## 2013-12-08 LAB — MAGNESIUM: Magnesium: 2.3 mg/dL (ref 1.5–2.5)

## 2013-12-08 MED ORDER — LABETALOL HCL 100 MG PO TABS
100.0000 mg | ORAL_TABLET | Freq: Two times a day (BID) | ORAL | Status: DC
  Administered 2013-12-09 (×2): 100 mg via ORAL
  Filled 2013-12-08 (×4): qty 1

## 2013-12-08 MED ORDER — SODIUM CHLORIDE 0.9 % IV SOLN
250.0000 mL | INTRAVENOUS | Status: DC | PRN
  Administered 2013-12-09: 250 mL via INTRAVENOUS

## 2013-12-08 MED ORDER — ALBUTEROL SULFATE (2.5 MG/3ML) 0.083% IN NEBU: 2.5000 mg | INHALATION_SOLUTION | RESPIRATORY_TRACT | Status: DC | PRN

## 2013-12-08 MED ORDER — HYDRALAZINE HCL 25 MG PO TABS
25.0000 mg | ORAL_TABLET | Freq: Once | ORAL | Status: AC
  Administered 2013-12-08: 25 mg via ORAL
  Filled 2013-12-08: qty 1

## 2013-12-08 MED ORDER — SODIUM CHLORIDE 0.9 % IJ SOLN
3.0000 mL | Freq: Two times a day (BID) | INTRAMUSCULAR | Status: DC
  Administered 2013-12-09 (×3): 3 mL via INTRAVENOUS

## 2013-12-08 MED ORDER — HEPARIN SODIUM (PORCINE) 5000 UNIT/ML IJ SOLN
5000.0000 [IU] | Freq: Three times a day (TID) | INTRAMUSCULAR | Status: DC
  Administered 2013-12-09 – 2013-12-10 (×5): 5000 [IU] via SUBCUTANEOUS
  Filled 2013-12-08 (×8): qty 1

## 2013-12-08 MED ORDER — ONDANSETRON HCL 4 MG/2ML IJ SOLN
4.0000 mg | Freq: Four times a day (QID) | INTRAMUSCULAR | Status: DC | PRN
Start: 2013-12-08 — End: 2013-12-10

## 2013-12-08 MED ORDER — AMLODIPINE BESYLATE 10 MG PO TABS
10.0000 mg | ORAL_TABLET | Freq: Every day | ORAL | Status: DC
  Administered 2013-12-09 – 2013-12-10 (×2): 10 mg via ORAL
  Filled 2013-12-08 (×2): qty 1

## 2013-12-08 MED ORDER — LABETALOL HCL 5 MG/ML IV SOLN
20.0000 mg | Freq: Once | INTRAVENOUS | Status: AC
  Administered 2013-12-08: 20 mg via INTRAVENOUS
  Filled 2013-12-08: qty 4

## 2013-12-08 MED ORDER — SODIUM CHLORIDE 0.9 % IJ SOLN: 3.0000 mL | INTRAMUSCULAR | Status: DC | PRN

## 2013-12-08 MED ORDER — POTASSIUM CHLORIDE CRYS ER 20 MEQ PO TBCR
40.0000 meq | EXTENDED_RELEASE_TABLET | Freq: Once | ORAL | Status: AC
  Administered 2013-12-09: 40 meq via ORAL
  Filled 2013-12-08: qty 2

## 2013-12-08 MED ORDER — FUROSEMIDE 10 MG/ML IJ SOLN
40.0000 mg | Freq: Two times a day (BID) | INTRAMUSCULAR | Status: DC
  Administered 2013-12-09 (×2): 40 mg via INTRAVENOUS
  Filled 2013-12-08 (×3): qty 4

## 2013-12-08 MED ORDER — HYDRALAZINE HCL 25 MG PO TABS
25.0000 mg | ORAL_TABLET | Freq: Three times a day (TID) | ORAL | Status: DC
  Administered 2013-12-09 (×2): 25 mg via ORAL
  Filled 2013-12-08 (×7): qty 1

## 2013-12-08 MED ORDER — HYDROCODONE-ACETAMINOPHEN 5-325 MG PO TABS: 1.0000 | ORAL_TABLET | ORAL | Status: DC | PRN

## 2013-12-08 MED ORDER — CLONIDINE HCL 0.1 MG PO TABS
0.1000 mg | ORAL_TABLET | Freq: Once | ORAL | Status: AC
  Administered 2013-12-08: 0.1 mg via ORAL
  Filled 2013-12-08: qty 1

## 2013-12-08 MED ORDER — ONDANSETRON HCL 4 MG PO TABS: 4.0000 mg | ORAL_TABLET | Freq: Four times a day (QID) | ORAL | Status: DC | PRN

## 2013-12-08 MED ORDER — HYDROMORPHONE HCL PF 1 MG/ML IJ SOLN: 1.0000 mg | INTRAMUSCULAR | Status: DC | PRN

## 2013-12-08 MED ORDER — CARVEDILOL 25 MG PO TABS
25.0000 mg | ORAL_TABLET | Freq: Two times a day (BID) | ORAL | Status: DC
  Administered 2013-12-09 – 2013-12-10 (×3): 25 mg via ORAL
  Filled 2013-12-08 (×5): qty 1

## 2013-12-08 NOTE — Progress Notes (Signed)
Chief Complaint:  Chief Complaint  Patient presents with  . Hypertension    pt was hypertensive in past, but hasn't been on his meds in 3 years  . Dizziness    couple weeks    HPI: Kenneth [name] is a 58 y.o. male who is here for dizziness for the last 2 weeks, he was found to be in a HTN emergency with BP of 256/150. In the  last 2 weeks he noticed he has had dizzy spells, He is dizzy standing up , denies dizziness with sitting,  last episode was  2 weeks ago but I am not sure if he is forthcoming about his symptoms.  He denies DM or hyperlipidemia.  Last took Blood pressure meds 3 years ago could not afford it,however he  he does drink alcohol, his last drink was this morning at 8 am  , he claims he has only 2 drinks daily.  Denies  HA or vision changes, palpitations, SOB, pedal edema, He denies that he has any chest pain at any time or currently   He has a history of CKD stage 4 Echocardiogram 09/22/11: Moderate LVH, EF 99991111, grade 3 diastolic dysfunction, mild MR, moderate to severe LAE, mild RVE, mild to moderate TR, small to moderate pericardial effusion.   Last office visit with [facility] cardiology from 2012:  He denies chest painRobert [name] is a 58 y.o. male who presents for post hospital follow up. He was admitted 11/25-12/4 with acute congestive heart failure and acute renal failure in the setting of hypertensive emergency. He apparently had a history of hypertension which was untreated. Echocardiogram 09/22/11: Moderate LVH, EF 99991111, grade 3 diastolic dysfunction, mild MR, moderate to severe LAE, mild RVE, mild to moderate TR, small to moderate pericardial effusion. The patient also developed symptoms consistent with alcohol withdrawal during his hospitalization. He was diagnosed with stage IV chronic kidney disease. He was seen by nephrology. It was felt his chronic renal failure was likely secondary to untreated hypertension. He has followup pending with nephrology.  Overall, it was felt that his cardiomyopathy is related to hypertensive heart disease. Labs: Potassium 3.3, creatinine 4.71, hemoglobin 13.3, cardiac enzymes negative, TSH 0.497, HIV nonreactive.  He comes in today with his daughter. He did not take his medications yet today. He thought he should hold them because of being seen at the doctor's office. I had a long discussion with the patient and his daughter regarding the importance of taking all of his medications on a daily basis. I gave him his clonidine, carvedilol, norvasc and hydralazine in the office and his BP came down to 160/110. The patient denies chest pain, shortness of breath, syncope, orthopnea, PND or significant pedal edema. He feels, overall, much better.    Past Medical History  Diagnosis Date  . Hypertension   . CKD (chronic kidney disease) stage 4, GFR 15-29 ml/min     renal failure; due to hypertensive nephrosclerosis  . Chronic combined systolic and diastolic heart failure     Echocardiogram 09/22/11: Moderate LVH, EF 99991111, grade 3 diastolic dysfunction, mild MR, moderate to severe LAE, mild RVE, mild to moderate TR, small to moderate pericardial effusion  . Cardiomyopathy secondary     likely related to HTN heart disease; possibly ETOH related as well  . History of alcohol abuse    No past surgical history on file. History   Social History  . Marital Status: Married    Spouse Name: N/A  Number of Children: N/A  . Years of Education: N/A   Social History Main Topics  . Smoking status: Former Research scientist (life sciences)  . Smokeless tobacco: Current User  . Alcohol Use: 1.8 oz/week    3 Cans of beer per week     Comment: occ  . Drug Use: No  . Sexual Activity:    Other Topics Concern  . None   Social History Narrative  . None   Family History  Problem Relation Age of Onset  . Emphysema Mother   . Cirrhosis Father    No Known Allergies Prior to Admission medications   Medication Sig Start Date End Date Taking?  Authorizing Provider  amLODipine (NORVASC) 10 MG tablet Take 1 tablet (10 mg total) by mouth daily. 09/24/11 09/23/12  Sorin June Leap, MD  carvedilol (COREG) 25 MG tablet Take 1 tablet (25 mg total) by mouth 2 (two) times daily with a meal. 09/24/11 09/23/12  Sorin June Leap, MD  cloNIDine (CATAPRES) 0.1 MG tablet Take 0.1 mg by mouth 3 (three) times daily.      Historical Provider, MD  furosemide (LASIX) 80 MG tablet Take 80 mg by mouth daily.   09/30/11 09/29/12  Sorin June Leap, MD  hydrALAZINE (APRESOLINE) 25 MG tablet Take 1 tablet (25 mg total) by mouth 2 (two) times daily. 09/30/11 09/29/12  Sorin June Leap, MD  Vitamins-Lipotropics (VIT BALANCED B-100 PO) Take 100 mg by mouth daily.      Historical Provider, MD     ROS: The patient denies fevers, chills, night sweats, unintentional weight loss, chest pain, palpitations, wheezing, dyspnea on exertion, nausea, vomiting, abdominal pain, dysuria, hematuria, melena, numbness, weakness, or tingling.   All other systems have been reviewed and were otherwise negative with the exception of those mentioned in the HPI and as above.    PHYSICAL EXAM: Filed Vitals:   12/08/13 1654  BP: 240/140  Pulse:   Temp:   Resp:    Filed Vitals:   12/08/13 1635  Height: 5' 10.5" (1.791 m)  Weight: 204 lb (92.534 kg)   Body mass index is 28.85 kg/(m^2).  General: Alert, no acute distress HEENT:  Normocephalic, atraumatic, oropharynx patent. EOMI, PERRLA Cardiovascular:  Regular rate and rhythm, no rubs murmurs or gallops.  No Carotid bruits, radial pulse intact. No pedal edema.  Respiratory: Clear to auscultation bilaterally.  No wheezes, rales, or rhonchi.  No cyanosis, no use of accessory musculature GI: No organomegaly, abdomen is soft and non-tender, positive bowel sounds.  No masses. Skin: No rashes. Neurologic: Facial musculature symmetric. Psychiatric: Patient is appropriate throughout our interaction. Lymphatic: No cervical  lymphadenopathy Musculoskeletal: Gait intact   LABS: Results for orders placed during the hospital encounter of 09/21/11  MRSA PCR SCREENING      Result Value Ref Range   MRSA by PCR NEGATIVE  NEGATIVE  CBC      Result Value Ref Range   WBC 6.7  4.0 - 10.5 K/uL   RBC 4.24  4.22 - 5.81 MIL/uL   Hemoglobin 13.1  13.0 - 17.0 g/dL   HCT 37.3 (*) 39.0 - 52.0 %   MCV 88.0  78.0 - 100.0 fL   MCH 30.9  26.0 - 34.0 pg   MCHC 35.1  30.0 - 36.0 g/dL   RDW 15.8 (*) 11.5 - 15.5 %   Platelets 240  150 - 400 K/uL  DIFFERENTIAL      Result Value Ref Range   Neutrophils Relative % 60  43 -  77 %   Neutro Abs 4.0  1.7 - 7.7 K/uL   Lymphocytes Relative 23  12 - 46 %   Lymphs Abs 1.5  0.7 - 4.0 K/uL   Monocytes Relative 14 (*) 3 - 12 %   Monocytes Absolute 0.9  0.1 - 1.0 K/uL   Eosinophils Relative 2  0 - 5 %   Eosinophils Absolute 0.2  0.0 - 0.7 K/uL   Basophils Relative 1  0 - 1 %   Basophils Absolute 0.1  0.0 - 0.1 K/uL  TROPONIN I      Result Value Ref Range   Troponin I <0.30  <0.30 ng/mL  PRO B NATRIURETIC PEPTIDE      Result Value Ref Range   Pro B Natriuretic peptide (BNP) 30315.0 (*) 0 - 125 pg/mL  URINALYSIS, ROUTINE W REFLEX MICROSCOPIC      Result Value Ref Range   Color, Urine YELLOW  YELLOW   APPearance CLOUDY (*) CLEAR   Specific Gravity, Urine 1.016  1.005 - 1.030   pH 5.0  5.0 - 8.0   Glucose, UA NEGATIVE  NEGATIVE mg/dL   Hgb urine dipstick SMALL (*) NEGATIVE   Bilirubin Urine SMALL (*) NEGATIVE   Ketones, ur NEGATIVE  NEGATIVE mg/dL   Protein, ur >300 (*) NEGATIVE mg/dL   Urobilinogen, UA 1.0  0.0 - 1.0 mg/dL   Nitrite NEGATIVE  NEGATIVE   Leukocytes, UA NEGATIVE  NEGATIVE  URINE MICROSCOPIC-ADD ON      Result Value Ref Range   Squamous Epithelial / LPF RARE  RARE   RBC / HPF 0-2  <3 RBC/hpf   Bacteria, UA RARE  RARE   Casts GRANULAR CAST (*) NEGATIVE  BASIC METABOLIC PANEL      Result Value Ref Range   Sodium 137  135 - 145 mEq/L   Potassium 4.1  3.5 - 5.1  mEq/L   Chloride 104  96 - 112 mEq/L   CO2 18 (*) 19 - 32 mEq/L   Glucose, Bld 85  70 - 99 mg/dL   BUN 58 (*) 6 - 23 mg/dL   Creatinine, Ser 4.72 (*) 0.50 - 1.35 mg/dL   Calcium 9.1  8.4 - 10.5 mg/dL   GFR calc non Af Amer 13 (*) >90 mL/min   GFR calc Af Amer 14 (*) >90 mL/min  TSH      Result Value Ref Range   TSH 4.947 (*) 0.350 - 4.500 uIU/mL  MAGNESIUM      Result Value Ref Range   Magnesium 2.2  1.5 - 2.5 mg/dL  CARDIAC PANEL(CRET KIN+CKTOT+MB+TROPI)      Result Value Ref Range   Total CK 184  7 - 232 U/L   CK, MB 6.7 (*) 0.3 - 4.0 ng/mL   Troponin I <0.30  <0.30 ng/mL   Relative Index 3.6 (*) 0.0 - 2.5  CARDIAC PANEL(CRET KIN+CKTOT+MB+TROPI)      Result Value Ref Range   Total CK 150  7 - 232 U/L   CK, MB 5.2 (*) 0.3 - 4.0 ng/mL   Troponin I <0.30  <0.30 ng/mL   Relative Index 3.5 (*) 0.0 - 2.5  CBC      Result Value Ref Range   WBC 5.5  4.0 - 10.5 K/uL   RBC 4.09 (*) 4.22 - 5.81 MIL/uL   Hemoglobin 12.5 (*) 13.0 - 17.0 g/dL   HCT 35.9 (*) 39.0 - 52.0 %   MCV 87.8  78.0 - 100.0 fL   MCH  30.6  26.0 - 34.0 pg   MCHC 34.8  30.0 - 36.0 g/dL   RDW 15.7 (*) 11.5 - 15.5 %   Platelets 221  150 - 400 K/uL  CREATININE, SERUM      Result Value Ref Range   Creatinine, Ser 4.72 (*) 0.50 - 1.35 mg/dL   GFR calc non Af Amer 13 (*) >90 mL/min   GFR calc Af Amer 14 (*) >90 mL/min  MICROALBUMIN / CREATININE URINE RATIO      Result Value Ref Range   Microalb, Ur 71.80 (*) 0.00 - 1.89 mg/dL   Creatinine, Urine 144.4     Microalb Creat Ratio 497.2 (*) 0.0 - 30.0 mg/g  SODIUM, URINE, RANDOM      Result Value Ref Range   Sodium, Ur 33    CREATININE, URINE, RANDOM      Result Value Ref Range   Creatinine, Urine 130.34    UREA NITROGEN, URINE      Result Value Ref Range   Urea Nitrogen, Ur 445    CARDIAC PANEL(CRET KIN+CKTOT+MB+TROPI)      Result Value Ref Range   Total CK 158  7 - 232 U/L   CK, MB 5.1 (*) 0.3 - 4.0 ng/mL   Troponin I <0.30  <0.30 ng/mL   Relative Index 3.2  (*) 0.0 - 2.5  PROTEIN ELECTROPHORESIS, SERUM      Result Value Ref Range   Total Protein ELP 6.6  6.0 - 8.3 g/dL   Albumin ELP 48.7 (*) 55.8 - 66.1 %   Alpha-1-Globulin 5.9 (*) 2.9 - 4.9 %   Alpha-2-Globulin 8.4  7.1 - 11.8 %   Beta Globulin 5.2  4.7 - 7.2 %   Beta 2 5.5  3.2 - 6.5 %   Gamma Globulin 26.3 (*) 11.1 - 18.8 %   M-Spike, % NOT DETECTED     SPE Interp. (NOTE)     Comment (NOTE)    PROTEIN ELECTROPHORESIS, URINE      Result Value Ref Range   Time RANDOM     Volume, Urine RANDOM     Total Protein, Urine 9.0     Total Protein, Urine-Ur/day NOT CALC  10 - 140 mg/day   Albumin, U DETECTED  DETECTED   Alpha 1, Urine DETECTED (*) NONE DETECTED   Alpha 2, Urine DETECTED (*) NONE DETECTED   Beta, Urine DETECTED (*) NONE DETECTED   Gamma Globulin, Urine NONE DETECTED  NONE DETECTED   Free Kappa Lt Chains,Ur 6.71 (*) 0.14 - 2.42 mg/dL   Free Lt Chn Excr Rate NOT CALC     Free Lambda Lt Chains,Ur 0.53  0.02 - 0.67 mg/dL   Free Lambda Excretion/Day NOT CALC     Free Kappa/Lambda Ratio 12.66 (*) 2.04 - 10.37 ratio   Immunofixation, Urine (NOTE)    T3, FREE      Result Value Ref Range   T3, Free 1.6 (*) 2.3 - 4.2 pg/mL  T4, FREE      Result Value Ref Range   Free T4 0.85  0.80 - 1.80 ng/dL  PARATHYROID HORMONE, INTACT (NO CA)      Result Value Ref Range   PTH 264.8 (*) 14.0 - 72.0 pg/mL  CREATININE, URINE, 24 HOUR      Result Value Ref Range   Urine Total Volume-UCRE24 9200     Collection Interval-UCRE24 24     Creatinine, Urine 19.05     Creatinine, 24H Ur 1753  800 - 2000 mg/day  PROTEIN, URINE, 24 HOUR      Result Value Ref Range   Urine Total Volume-UPROT 9200     Collection Interval-UPROT 24     Protein, Urine 4     Protein, 24H Urine 368 (*) 50 - 100 mg/day  RENAL FUNCTION PANEL      Result Value Ref Range   Sodium 135  135 - 145 mEq/L   Potassium 3.1 (*) 3.5 - 5.1 mEq/L   Chloride 97  96 - 112 mEq/L   CO2 25  19 - 32 mEq/L   Glucose, Bld 128 (*) 70 - 99  mg/dL   BUN 61 (*) 6 - 23 mg/dL   Creatinine, Ser 4.76 (*) 0.50 - 1.35 mg/dL   Calcium 8.8  8.4 - 10.5 mg/dL   Phosphorus 4.6  2.3 - 4.6 mg/dL   Albumin 2.6 (*) 3.5 - 5.2 g/dL   GFR calc non Af Amer 12 (*) >90 mL/min   GFR calc Af Amer 14 (*) >90 mL/min  CBC      Result Value Ref Range   WBC 5.5  4.0 - 10.5 K/uL   RBC 3.85 (*) 4.22 - 5.81 MIL/uL   Hemoglobin 11.6 (*) 13.0 - 17.0 g/dL   HCT 34.1 (*) 39.0 - 52.0 %   MCV 88.6  78.0 - 100.0 fL   MCH 30.1  26.0 - 34.0 pg   MCHC 34.0  30.0 - 36.0 g/dL   RDW 16.1 (*) 11.5 - 15.5 %   Platelets 207  150 - 400 K/uL  HIV ANTIBODY (ROUTINE TESTING)      Result Value Ref Range   HIV NON REACTIVE  NON REACTIVE  BASIC METABOLIC PANEL      Result Value Ref Range   Sodium 138  135 - 145 mEq/L   Potassium 2.8 (*) 3.5 - 5.1 mEq/L   Chloride 96  96 - 112 mEq/L   CO2 27  19 - 32 mEq/L   Glucose, Bld 88  70 - 99 mg/dL   BUN 62 (*) 6 - 23 mg/dL   Creatinine, Ser 4.73 (*) 0.50 - 1.35 mg/dL   Calcium 9.0  8.4 - 10.5 mg/dL   GFR calc non Af Amer 12 (*) >90 mL/min   GFR calc Af Amer 14 (*) >90 mL/min  CBC      Result Value Ref Range   WBC 5.9  4.0 - 10.5 K/uL   RBC 3.91 (*) 4.22 - 5.81 MIL/uL   Hemoglobin 11.9 (*) 13.0 - 17.0 g/dL   HCT 34.6 (*) 39.0 - 52.0 %   MCV 88.5  78.0 - 100.0 fL   MCH 30.4  26.0 - 34.0 pg   MCHC 34.4  30.0 - 36.0 g/dL   RDW 16.1 (*) 11.5 - 15.5 %   Platelets 213  150 - 400 K/uL  BASIC METABOLIC PANEL      Result Value Ref Range   Sodium 137  135 - 145 mEq/L   Potassium 3.4 (*) 3.5 - 5.1 mEq/L   Chloride 91 (*) 96 - 112 mEq/L   CO2 30  19 - 32 mEq/L   Glucose, Bld 92  70 - 99 mg/dL   BUN 62 (*) 6 - 23 mg/dL   Creatinine, Ser 4.81 (*) 0.50 - 1.35 mg/dL   Calcium 9.4  8.4 - 10.5 mg/dL   GFR calc non Af Amer 12 (*) >90 mL/min   GFR calc Af Amer 14 (*) >90 mL/min  RENAL FUNCTION PANEL  Result Value Ref Range   Sodium 136  135 - 145 mEq/L   Potassium 3.1 (*) 3.5 - 5.1 mEq/L   Chloride 90 (*) 96 - 112 mEq/L    CO2 32  19 - 32 mEq/L   Glucose, Bld 91  70 - 99 mg/dL   BUN 65 (*) 6 - 23 mg/dL   Creatinine, Ser 4.78 (*) 0.50 - 1.35 mg/dL   Calcium 9.6  8.4 - 10.5 mg/dL   Phosphorus 4.5  2.3 - 4.6 mg/dL   Albumin 3.1 (*) 3.5 - 5.2 g/dL   GFR calc non Af Amer 12 (*) >90 mL/min   GFR calc Af Amer 14 (*) >90 mL/min  CBC      Result Value Ref Range   WBC 4.9  4.0 - 10.5 K/uL   RBC 4.31  4.22 - 5.81 MIL/uL   Hemoglobin 13.6  13.0 - 17.0 g/dL   HCT 38.1 (*) 39.0 - 52.0 %   MCV 88.4  78.0 - 100.0 fL   MCH 31.6  26.0 - 34.0 pg   MCHC 35.7  30.0 - 36.0 g/dL   RDW 16.0 (*) 11.5 - 15.5 %   Platelets 243  150 - 400 K/uL  FERRITIN      Result Value Ref Range   Ferritin 121  22 - 322 ng/mL  IRON AND TIBC      Result Value Ref Range   Iron 27 (*) 42 - 135 ug/dL   TIBC 308  215 - 435 ug/dL   Saturation Ratios 9 (*) 20 - 55 %   UIBC 281  125 - 400 ug/dL  RENAL FUNCTION PANEL      Result Value Ref Range   Sodium 136  135 - 145 mEq/L   Potassium 3.6  3.5 - 5.1 mEq/L   Chloride 91 (*) 96 - 112 mEq/L   CO2 30  19 - 32 mEq/L   Glucose, Bld 96  70 - 99 mg/dL   BUN 69 (*) 6 - 23 mg/dL   Creatinine, Ser 5.08 (*) 0.50 - 1.35 mg/dL   Calcium 9.5  8.4 - 10.5 mg/dL   Phosphorus 4.3  2.3 - 4.6 mg/dL   Albumin 3.1 (*) 3.5 - 5.2 g/dL   GFR calc non Af Amer 11 (*) >90 mL/min   GFR calc Af Amer 13 (*) >90 mL/min  CBC      Result Value Ref Range   WBC 4.8  4.0 - 10.5 K/uL   RBC 4.28  4.22 - 5.81 MIL/uL   Hemoglobin 13.4  13.0 - 17.0 g/dL   HCT 37.8 (*) 39.0 - 52.0 %   MCV 88.3  78.0 - 100.0 fL   MCH 31.3  26.0 - 34.0 pg   MCHC 35.4  30.0 - 36.0 g/dL   RDW 16.0 (*) 11.5 - 15.5 %   Platelets 267  150 - 400 K/uL  MAGNESIUM      Result Value Ref Range   Magnesium 2.2  1.5 - 2.5 mg/dL  CREATININE, SERUM      Result Value Ref Range   Creatinine, Ser 4.95 (*) 0.50 - 1.35 mg/dL   GFR calc non Af Amer 12 (*) >90 mL/min   GFR calc Af Amer 14 (*) >90 mL/min  BASIC METABOLIC PANEL      Result Value Ref Range    Sodium 135  135 - 145 mEq/L   Potassium 3.3 (*) 3.5 - 5.1 mEq/L   Chloride 93 (*) 96 -  112 mEq/L   CO2 29  19 - 32 mEq/L   Glucose, Bld 94  70 - 99 mg/dL   BUN 73 (*) 6 - 23 mg/dL   Creatinine, Ser 5.08 (*) 0.50 - 1.35 mg/dL   Calcium 9.3  8.4 - 10.5 mg/dL   GFR calc non Af Amer 11 (*) >90 mL/min   GFR calc Af Amer 13 (*) >90 mL/min  CBC      Result Value Ref Range   WBC 4.5  4.0 - 10.5 K/uL   RBC 4.33  4.22 - 5.81 MIL/uL   Hemoglobin 13.0  13.0 - 17.0 g/dL   HCT 38.4 (*) 39.0 - 52.0 %   MCV 88.7  78.0 - 100.0 fL   MCH 30.0  26.0 - 34.0 pg   MCHC 33.9  30.0 - 36.0 g/dL   RDW 16.0 (*) 11.5 - 15.5 %   Platelets 272  150 - 400 K/uL  BASIC METABOLIC PANEL      Result Value Ref Range   Sodium 135  135 - 145 mEq/L   Potassium 3.3 (*) 3.5 - 5.1 mEq/L   Chloride 95 (*) 96 - 112 mEq/L   CO2 26  19 - 32 mEq/L   Glucose, Bld 97  70 - 99 mg/dL   BUN 70 (*) 6 - 23 mg/dL   Creatinine, Ser 4.70 (*) 0.50 - 1.35 mg/dL   Calcium 9.6  8.4 - 10.5 mg/dL   GFR calc non Af Amer 13 (*) >90 mL/min   GFR calc Af Amer 15 (*) >90 mL/min  CBC      Result Value Ref Range   WBC 4.3  4.0 - 10.5 K/uL   RBC 4.33  4.22 - 5.81 MIL/uL   Hemoglobin 13.3  13.0 - 17.0 g/dL   HCT 38.1 (*) 39.0 - 52.0 %   MCV 88.0  78.0 - 100.0 fL   MCH 30.7  26.0 - 34.0 pg   MCHC 34.9  30.0 - 36.0 g/dL   RDW 15.7 (*) 11.5 - 15.5 %   Platelets 249  150 - 400 K/uL  POCT I-STAT, CHEM 8      Result Value Ref Range   Sodium 138  135 - 145 mEq/L   Potassium 4.1  3.5 - 5.1 mEq/L   Chloride 111  96 - 112 mEq/L   BUN 55 (*) 6 - 23 mg/dL   Creatinine, Ser 4.90 (*) 0.50 - 1.35 mg/dL   Glucose, Bld 103 (*) 70 - 99 mg/dL   Calcium, Ion 1.15  1.12 - 1.32 mmol/L   TCO2 17  0 - 100 mmol/L   Hemoglobin 14.6  13.0 - 17.0 g/dL   HCT 43.0  39.0 - 52.0 %     EKG/XRAY:   Primary read interpreted by Dr. Marin Comment at [facility].   ASSESSMENT/PLAN: Encounter Diagnoses  Name Primary?  . Hypertensive emergency Yes  . Dizziness   . Abnormal EKG     58 y/o male with 2 week historyoff dizziness, found to be in hypertensive emergency, ran out of meds 3 years ago and has not taken any since  He has a significant heart history  and also CKD  Stage 4 please see prior hospitalization notes from 2012. He deneis any CP at this time. Due to heart history, uncontrolled HTN and abnormal EKG changes will send to [facility] ER for further evaulation ER attending notified  Spoke with patient and daughter about plan  Gross sideeffects, risk and benefits, and  alternatives of medications d/w patient. Patient is aware that all medications have potential sideeffects and we are unable to predict every sideeffect or drug-drug interaction that may occur.  Leotis Pain, DO 12/08/2013 5:38 PM

## 2013-12-08 NOTE — ED Provider Notes (Signed)
CSN: [id number]     Arrival date & time 12/08/13  1747 History   First MD Initiated Contact with Patient 12/08/13 1755     Chief Complaint  Patient presents with  . Hypertension     (Consider location/radiation/quality/duration/timing/severity/associated sxs/prior Treatment) Patient is a 58 y.o. male presenting with hypertension.  Hypertension   57 yo AA male presents today to ED from outpatient clinic with Elevated BP. Patient states he has been having intermittent dizziness with standing over the past week which prompted him to be seen. Patient currently denies any dizziness, Chest pain, SOB, HA, abdominal pain, leg swelling, palpitation, weakness, or visual changes. Patient states he has a hx of HTN but hasnt taken any medications in the past 3 years because he couldn't afford them. PMH significant for HTN, CKD, CHF, and alcohol abuse.  Past Medical History  Diagnosis Date  . Hypertension   . CKD (chronic kidney disease) stage 4, GFR 15-29 ml/min     renal failure; due to hypertensive nephrosclerosis  . Chronic combined systolic and diastolic heart failure     Echocardiogram 09/22/11: Moderate LVH, EF 99991111, grade 3 diastolic dysfunction, mild MR, moderate to severe LAE, mild RVE, mild to moderate TR, small to moderate pericardial effusion  . Cardiomyopathy secondary     likely related to HTN heart disease; possibly ETOH related as well  . History of alcohol abuse    History reviewed. No pertinent past surgical history. Family History  Problem Relation Age of Onset  . Emphysema Mother   . Cirrhosis Father    History  Substance Use Topics  . Smoking status: Former Research scientist (life sciences)  . Smokeless tobacco: Current User  . Alcohol Use: 1.8 oz/week    3 Cans of beer per week     Comment: occ    Review of Systems  All other systems reviewed and are negative.      Allergies  Review of patient's allergies indicates no known allergies.  Home Medications   No current outpatient  prescriptions on file. BP 143/74  Pulse 61  Temp(Src) 98.9 F (37.2 C) (Oral)  Resp 18  Ht 6' (1.829 m)  Wt 199 lb 15.3 oz (90.7 kg)  BMI 27.11 kg/m2  SpO2 100% Physical Exam  Nursing note and vitals reviewed. Constitutional: He is oriented to person, place, and time. He appears well-developed and well-nourished. No distress.  HENT:  Head: Normocephalic and atraumatic.  Mouth/Throat: Uvula is midline, oropharynx is clear and moist and mucous membranes are normal.  Eyes: Conjunctivae and EOM are normal. Pupils are equal, round, and reactive to light. No scleral icterus.  Limited funduscopic exam performed without any obvious abnormalities noted.   Neck: Trachea normal and phonation normal. Neck supple. No JVD present. Carotid bruit is not present. No tracheal deviation present.  Cardiovascular: Normal rate, regular rhythm and intact distal pulses.  Exam reveals no gallop and no friction rub.   No murmur heard. Pulmonary/Chest: Effort normal and breath sounds normal. No respiratory distress. He has no wheezes. He has no rhonchi. He has no rales.  Abdominal: Soft. Bowel sounds are normal. He exhibits no distension. There is no hepatosplenomegaly. There is no tenderness. There is no rigidity, no rebound, no guarding, no tenderness at McBurney's point and negative Murphy's sign.  Musculoskeletal: Normal range of motion. He exhibits no edema.  Neurological: He is alert and oriented to person, place, and time. He has normal strength. No cranial nerve deficit or sensory deficit.  CN II-XII grossly intact. Cerebellar  function appears intact with finger to nose.   Skin: Skin is warm and dry. He is not diaphoretic.  Psychiatric: He has a normal mood and affect. His behavior is normal.    ED Course  Procedures (including critical care time) Labs Review Labs Reviewed  URINALYSIS, ROUTINE W REFLEX MICROSCOPIC - Abnormal; Notable for the following:    Hgb urine dipstick SMALL (*)    Protein, ur  100 (*)    All other components within normal limits  CBC WITH DIFFERENTIAL - Abnormal; Notable for the following:    RBC 3.74 (*)    Hemoglobin 11.7 (*)    HCT 33.9 (*)    Eosinophils Relative 6 (*)    All other components within normal limits  BASIC METABOLIC PANEL - Abnormal; Notable for the following:    BUN 29 (*)    Creatinine, Ser 5.10 (*)    GFR calc non Af Amer 11 (*)    GFR calc Af Amer 13 (*)    All other components within normal limits  PRO B NATRIURETIC PEPTIDE - Abnormal; Notable for the following:    Pro B Natriuretic peptide (BNP) 5234.0 (*)    All other components within normal limits  PRO B NATRIURETIC PEPTIDE - Abnormal; Notable for the following:    Pro B Natriuretic peptide (BNP) 5715.0 (*)    All other components within normal limits  URINALYSIS, ROUTINE W REFLEX MICROSCOPIC - Abnormal; Notable for the following:    Hgb urine dipstick SMALL (*)    Protein, ur 100 (*)    All other components within normal limits  BASIC METABOLIC PANEL - Abnormal; Notable for the following:    BUN 32 (*)    Creatinine, Ser 4.83 (*)    GFR calc non Af Amer 12 (*)    GFR calc Af Amer 14 (*)    All other components within normal limits  CBC - Abnormal; Notable for the following:    RBC 3.64 (*)    Hemoglobin 11.6 (*)    HCT 33.1 (*)    All other components within normal limits  MRSA PCR SCREENING  URINE MICROSCOPIC-ADD ON  MAGNESIUM  PHOSPHORUS  TSH  HEMOGLOBIN A1C  TROPONIN I  TROPONIN I  TROPONIN I  URINE RAPID DRUG SCREEN (HOSP PERFORMED)  URINE MICROSCOPIC-ADD ON  CBC  BASIC METABOLIC PANEL  PARATHYROID HORMONE, INTACT (NO CA)  IRON AND TIBC  FERRITIN  PROTEIN ELECTROPHORESIS, SERUM  POCT I-STAT TROPONIN I   Imaging Review Dg Chest Port 1 View  12/08/2013   CLINICAL DATA:  58 year old male hypertension dizziness, cardiomyopathy. Initial encounter.  EXAM: PORTABLE CHEST - 1 VIEW  COMPARISON:  09/21/2011.  FINDINGS: Portable AP semi upright view at 1855 hrs.  Progressed cardiomegaly. Globular contour of the heart. Other mediastinal contours are within normal limits. Visualized tracheal air column is within normal limits. Pulmonary vascular congestion. Trace pleural fluid along the right minor fissure. No overt pulmonary edema. No other pleural effusion evident. No pneumothorax. No consolidation.  IMPRESSION: Progressed cardiomegaly. Globular contour of the heart such that pericardial effusion is not excluded. Increased pulmonary vascular congestion and trace pleural fluid without overt pulmonary edema.   Electronically Signed   By: Lars Pinks M.D.   On: 12/08/2013 19:05      MDM   Final diagnoses:  Cardiomyopathy, secondary  CKD (chronic kidney disease), stage IV  Systolic and diastolic CHF, acute on chronic  Alcohol withdrawal delirium  Hypertensive urgency   EKG shows worsening  LVH CXR shows cardiomegaly w/ globular contour, suspicious for pericardial effusion. Increased pulmonary vascular congestion.  BNP elevated > 5000 Troponin negative.  UA shows mild proteinuria and hgb. Similar to previous study.  Renal insufficiency appears stable.   Patient discussed with Dr. Doy Mince. Plan to admit patient.         Sherrie George, PA-C 12/10/13 [phone]

## 2013-12-08 NOTE — ED Notes (Signed)
Contacted Lab to Add on BNP.

## 2013-12-08 NOTE — ED Notes (Signed)
MD at bedside. 

## 2013-12-08 NOTE — H&P (Signed)
[facility] History and Physical  Kenneth [name] [name] DOB: June 13, 1956 DOA: 12/08/2013  Referring physician: ED physician PCP: [name], NP   Chief Complaint:   HPI:  Patient is 58 year old male with history of hypertension, hyperlipidemia, combined congestive heart failure, ejection fraction 30-35% with grade 3 diastolic dysfunction, chronic kidney disease stage 3-4, medical noncompliance who presents to [facility] emergency department with main concern of two-week duration of dizziness, generalized weakness. He explains his symptoms are worse with standing up and exertion, somewhat improved with rest. Patient also reports associated generalized headaches, throbbing and pressure-like, 5/10 in severity, nonradiating, no specific alleviating or aggravating factors. He reports checking blood pressure last week and it was higher than 190/120. He has not taken medications over a year ago as he is not able to afford it. He currently denies chest pain, no shortness of breath, no specific abdominal or urinary concerns.  In emergency department, patient found to be hemodynamically stable, blood pressure 220/120. Creatinine 5.1 which is above his last baseline in 2012 (4.71). [facility] asked to admit for further evaluation and management of hypertensive urgency. Step down been requested.  Assessment and Plan: Active Problems: HTN-ive urgency - Secondary to long history of medical noncompliance - will continue Norvasc, Coreg, Hydralazine, per home medical regimen - will place on Lasix 40 mg IV BID, also add Labetalol 100 mg BID - monitor closely in SDU - Social work consult for assistance with medications Acute on chronic renal failure, stage IV - most likely secondary to uncontrolled HTN - will monitor renal function closely while on higher dose of Lasix  - consider renal US, but it will most likely shown chronic medical disease - Consider nephrology consultation in the morning - Repeat  BMP in the morning Combined systolic and diastolic CHF, EF 30 % with grade II diastolic dysfunction  - last 2 D ECHO 08/2011 with EF 30-35%, grade III diastolic dysfunction - will place on Lasix 40 mg IV BID for now and will monitor clinical response - will order 2 D ECHO  - daily weights, strict I's and O's  Code Status: Full Family Communication: Pt at bedside Disposition Plan: Admit to SDU   Review of Systems:  Constitutional: Negative for diaphoresis.  HENT: Negative for hearing loss, ear pain, nosebleeds, congestion, sore throat, neck pain, tinnitus and ear discharge.   Eyes: Negative for blurred vision, double vision, photophobia, pain, discharge and redness.  Respiratory: Negative for cough, hemoptysis, wheezing and stridor.   Cardiovascular: Negative for chest pain, palpitations, orthopnea, claudication and leg swelling.  Gastrointestinal: Negative for heartburn, constipation, blood in stool and melena.  Genitourinary: Negative for dysuria, urgency, frequency, hematuria and flank pain.  Musculoskeletal: Negative for myalgias, back pain, joint pain and falls.  Skin: Negative for itching and rash.  Neurological: Per history of present illness Endo/Heme/Allergies: Negative for environmental allergies and polydipsia. Does not bruise/bleed easily.  Psychiatric/Behavioral: Negative for suicidal ideas. The patient is not nervous/anxious.      Past Medical History  Diagnosis Date  . Hypertension   . CKD (chronic kidney disease) stage 4, GFR 15-29 ml/min     renal failure; due to hypertensive nephrosclerosis  . Chronic combined systolic and diastolic heart failure     Echocardiogram 09/22/11: Moderate LVH, EF 99991111, grade 3 diastolic dysfunction, mild MR, moderate to severe LAE, mild RVE, mild to moderate TR, small to moderate pericardial effusion  . Cardiomyopathy secondary     likely related to HTN heart disease; possibly ETOH related as well  .  History of alcohol abuse      History reviewed. No pertinent past surgical history.  Social History:  reports that he has quit smoking. He uses smokeless tobacco. He reports that he drinks about 1.8 ounces of alcohol per week. He reports that he does not use illicit drugs.  No Known Allergies  Family History  Problem Relation Age of Onset  . Emphysema Mother   . Cirrhosis Father     Prior to Admission medications   Medication Sig Start Date End Date Taking? Authorizing Provider  amLODipine (NORVASC) 10 MG tablet Take 1 tablet (10 mg total) by mouth daily. 09/24/11 09/23/12  Sorin June Leap, MD  carvedilol (COREG) 25 MG tablet Take 1 tablet (25 mg total) by mouth 2 (two) times daily with a meal. 09/24/11 09/23/12  Sorin June Leap, MD  furosemide (LASIX) 80 MG tablet Take 80 mg by mouth daily.   09/30/11 09/29/12  Sorin June Leap, MD  hydrALAZINE (APRESOLINE) 25 MG tablet Take 1 tablet (25 mg total) by mouth 2 (two) times daily. 09/30/11 09/29/12  Sorin June Leap, MD    Physical Exam: Filed Vitals:   12/08/13 2015 12/08/13 2030 12/08/13 2045 12/08/13 2115  BP: 223/123 231/116 215/120 186/94  Pulse: 64 59 60 63  Resp: 26 21 24 16   SpO2: 98% 100% 99% 99%    Physical Exam  Constitutional: Appears well-developed and well-nourished. No distress.  HENT: Normocephalic. External right and left ear normal. Oropharynx is clear and moist.  Eyes: Conjunctivae and EOM are normal. PERRLA, no scleral icterus.  Neck: Normal ROM. Neck supple. No JVD. No tracheal deviation. No thyromegaly.  CVS: RRR, S1/S2 +, no murmurs, no gallops, no carotid bruit.  Pulmonary: Effort and breath sounds normal, no stridor, basilar crackles Abdominal: Soft. BS +,  no distension, tenderness, rebound or guarding.  Musculoskeletal: Normal range of motion. +1 bilateral lower extremity pitting edema and no tenderness.  Lymphadenopathy: No lymphadenopathy noted, cervical, inguinal. Neuro: Alert. Normal reflexes, muscle tone coordination. No cranial nerve  deficit. Skin: Skin is warm and dry. No rash noted. Not diaphoretic. No erythema. No pallor.  Psychiatric: Normal mood and affect. Behavior, judgment, thought content normal.   Labs on Admission:  Basic Metabolic Panel:  Recent Labs Lab 12/08/13 1829  NA 143  K 3.8  CL 105  CO2 22  GLUCOSE 80  BUN 29*  CREATININE 5.10*  CALCIUM 9.0   CBC:  Recent Labs Lab 12/08/13 1829  WBC 5.1  NEUTROABS 2.5  HGB 11.7*  HCT 33.9*  MCV 90.6  PLT 216   Radiological Exams on Admission: Dg Chest Port 1 View   12/08/2013  Progressed cardiomegaly. Globular contour of the heart such that pericardial effusion is not excluded. Increased pulmonary vascular congestion and trace pleural fluid without overt pulmonary edema.     EKG: Pending at this time  Faye Ramsay, MD  [facility] Pager [phone]  If 7PM-7AM, please contact night-coverage [email or web address] Password [email or web address] 12/08/2013, 9:31 PM

## 2013-12-08 NOTE — ED Notes (Signed)
Pt from [place]. Pt came to [facility] for dizziness. BP was determined to be 250/140. Pt is noncompliant with BP meds at home. Pt denies CP, SOB, NVD. Pt's 12 lead at UC showed changeds from baseline. UC told EMS it showed LVH.  20 g LAC 240/140 HR-71 100% RA Pt admits to having one drink at 0800.

## 2013-12-09 DIAGNOSIS — I517 Cardiomegaly: Secondary | ICD-10-CM

## 2013-12-09 DIAGNOSIS — I10 Essential (primary) hypertension: Secondary | ICD-10-CM

## 2013-12-09 DIAGNOSIS — N184 Chronic kidney disease, stage 4 (severe): Secondary | ICD-10-CM

## 2013-12-09 LAB — BASIC METABOLIC PANEL
BUN: 32 mg/dL — ABNORMAL HIGH (ref 6–23)
CHLORIDE: 105 meq/L (ref 96–112)
CO2: 19 mEq/L (ref 19–32)
Calcium: 9.2 mg/dL (ref 8.4–10.5)
Creatinine, Ser: 4.83 mg/dL — ABNORMAL HIGH (ref 0.50–1.35)
GFR calc non Af Amer: 12 mL/min — ABNORMAL LOW (ref >90)
GFR, EST AFRICAN AMERICAN: 14 mL/min — AB (ref >90)
Glucose, Bld: 87 mg/dL (ref 70–99)
Potassium: 3.9 mEq/L (ref 3.7–5.3)
Sodium: 142 mEq/L (ref 137–147)

## 2013-12-09 LAB — URINE MICROSCOPIC-ADD ON

## 2013-12-09 LAB — URINALYSIS, ROUTINE W REFLEX MICROSCOPIC
Bilirubin Urine: NEGATIVE
Glucose, UA: NEGATIVE mg/dL
KETONES UR: NEGATIVE mg/dL
Leukocytes, UA: NEGATIVE
NITRITE: NEGATIVE
PH: 6 (ref 5.0–8.0)
Protein, ur: 100 mg/dL — AB
Specific Gravity, Urine: 1.012 (ref 1.005–1.030)
UROBILINOGEN UA: 0.2 mg/dL (ref 0.0–1.0)

## 2013-12-09 LAB — TROPONIN I: Troponin I: <0.3 ng/mL (ref <0.30)

## 2013-12-09 LAB — CBC
HCT: 33.1 % — ABNORMAL LOW (ref 39.0–52.0)
Hemoglobin: 11.6 g/dL — ABNORMAL LOW (ref 13.0–17.0)
MCH: 31.9 pg (ref 26.0–34.0)
MCHC: 35 g/dL (ref 30.0–36.0)
MCV: 90.9 fL (ref 78.0–100.0)
Platelets: 208 10*3/uL (ref 150–400)
RBC: 3.64 MIL/uL — ABNORMAL LOW (ref 4.22–5.81)
RDW: 15.3 % (ref 11.5–15.5)
WBC: 4.9 10*3/uL (ref 4.0–10.5)

## 2013-12-09 LAB — RAPID URINE DRUG SCREEN, HOSP PERFORMED
Amphetamines: NOT DETECTED
BARBITURATES: NOT DETECTED
Benzodiazepines: NOT DETECTED
COCAINE: NOT DETECTED
Opiates: NOT DETECTED
Tetrahydrocannabinol: NOT DETECTED

## 2013-12-09 LAB — MRSA PCR SCREENING: MRSA BY PCR: NEGATIVE

## 2013-12-09 LAB — HEMOGLOBIN A1C
HEMOGLOBIN A1C: 5.1 % (ref <5.7)
MEAN PLASMA GLUCOSE: 100 mg/dL (ref <117)

## 2013-12-09 LAB — TSH: TSH: 3.517 u[IU]/mL (ref 0.350–4.500)

## 2013-12-09 MED ORDER — HYDRALAZINE HCL 20 MG/ML IJ SOLN
10.0000 mg | INTRAMUSCULAR | Status: DC | PRN
Start: 2013-12-09 — End: 2013-12-09
  Administered 2013-12-09: 10 mg via INTRAVENOUS

## 2013-12-09 MED ORDER — HYDRALAZINE HCL 50 MG PO TABS
50.0000 mg | ORAL_TABLET | Freq: Three times a day (TID) | ORAL | Status: DC
  Administered 2013-12-09 – 2013-12-10 (×3): 50 mg via ORAL
  Filled 2013-12-09 (×6): qty 1

## 2013-12-09 MED ORDER — LABETALOL HCL 5 MG/ML IV SOLN
10.0000 mg | INTRAVENOUS | Status: DC | PRN
  Filled 2013-12-09: qty 4

## 2013-12-09 MED ORDER — HYDRALAZINE HCL 20 MG/ML IJ SOLN
INTRAMUSCULAR | Status: AC
  Filled 2013-12-09: qty 1

## 2013-12-09 NOTE — Discharge Instructions (Signed)
Primary Care Physician (with insurance coverage) To obtain a Primary Care Physician you can call the toll free number on your insurance card and request a providers list for your area. You may also visit the insurance provider online.  Or you can contact Health Connect.   [facility] Connect [phone] or Physician Referral service [phone] choose option #2 or [phone]

## 2013-12-09 NOTE — Progress Notes (Signed)
[facility] PROGRESS NOTE  Kenneth [name] [id number] DOB: July 07, 1956 DOA: 12/08/2013 PCP: [name], NP  Assessment/Plan:  1. Hypertensive emergency -BP improved on PO regimen and received IV boluses too -will continue coreg, amlodipine, increase hydralazine to 50mg  TID -CM consult for assistance with finding PCP/meds -FU echo  2. CKD 4 -chronic, surprising creatinine about the same since 2012 -lost to FU with nephrology -i have requested renal eval for CKD and follow up  3. H/o chronic systolic CHF -Fu echo -continue coreg -will need diuretics at discharge  4. ETOh use -counseled, thiamine  DVT proph: hep SQ  Code Status: Full Code Family Communication: no family at bedside Disposition Plan: home when improved   Consultants:  Renal pending  HPI/Subjective: Feels better, no complaints, no dyspnea  Objective: Filed Vitals:   12/09/13 0800  BP: 176/74  Pulse:   Temp: 97.9 F (36.6 C)  Resp: 22    Intake/Output Summary (Last 24 hours) at 12/09/13 1018 Last data filed at 12/09/13 0900  Gross per 24 hour  Intake    460 ml  Output   1500 ml  Net  -1040 ml   Filed Weights   12/09/13 0214  Weight: 90.5 kg (199 lb 8.3 oz)    Exam:   General:  AAOx3, no distress  Cardiovascular: S1S2/RRR  Respiratory: CTAB  Abdomen: soft, NT, BS present  Musculoskeletal: no edema c/c   Data Reviewed: Basic Metabolic Panel:  Recent Labs Lab 12/08/13 1829 12/08/13 2256 12/09/13 0400  NA 143  --  142  K 3.8  --  3.9  CL 105  --  105  CO2 22  --  19  GLUCOSE 80  --  87  BUN 29*  --  32*  CREATININE 5.10*  --  4.83*  CALCIUM 9.0  --  9.2  MG  --  2.3  --   PHOS  --  4.5  --    Liver Function Tests: No results found for this basename: AST, ALT, ALKPHOS, BILITOT, PROT, ALBUMIN,  in the last 168 hours No results found for this basename: LIPASE, AMYLASE,  in the last 168 hours No results found for this basename: AMMONIA,  in the last 168  hours CBC:  Recent Labs Lab 12/08/13 1829 12/09/13 0400  WBC 5.1 4.9  NEUTROABS 2.5  --   HGB 11.7* 11.6*  HCT 33.9* 33.1*  MCV 90.6 90.9  PLT 216 208   Cardiac Enzymes:  Recent Labs Lab 12/08/13 2256 12/09/13 0400  TROPONINI <0.30 <0.30   BNP (last 3 results)  Recent Labs  12/08/13 1829 12/08/13 2256  PROBNP 5234.0* 5715.0*   CBG: No results found for this basename: GLUCAP,  in the last 168 hours  Recent Results (from the past 240 hour(s))  MRSA PCR SCREENING     Status: None   Collection Time    12/09/13  2:16 AM      Result Value Ref Range Status   MRSA by PCR NEGATIVE  NEGATIVE Final   Comment:            The GeneXpert MRSA Assay (FDA     approved for NASAL specimens     only), is one component of a     comprehensive MRSA colonization     surveillance program. It is not     intended to diagnose MRSA     infection nor to guide or     monitor treatment for     MRSA infections.  Studies: Dg Chest Port 1 View  12/08/2013   CLINICAL DATA:  58 year old male hypertension dizziness, cardiomyopathy. Initial encounter.  EXAM: PORTABLE CHEST - 1 VIEW  COMPARISON:  09/21/2011.  FINDINGS: Portable AP semi upright view at 1855 hrs. Progressed cardiomegaly. Globular contour of the heart. Other mediastinal contours are within normal limits. Visualized tracheal air column is within normal limits. Pulmonary vascular congestion. Trace pleural fluid along the right minor fissure. No overt pulmonary edema. No other pleural effusion evident. No pneumothorax. No consolidation.  IMPRESSION: Progressed cardiomegaly. Globular contour of the heart such that pericardial effusion is not excluded. Increased pulmonary vascular congestion and trace pleural fluid without overt pulmonary edema.   Electronically Signed   By: Lars Pinks M.D.   On: 12/08/2013 19:05    Scheduled Meds: . amLODipine  10 mg Oral Daily  . carvedilol  25 mg Oral BID WC  . furosemide  40 mg Intravenous BID  .  heparin  5,000 Units Subcutaneous 3 times per day  . hydrALAZINE  25 mg Oral 3 times per day  . labetalol  100 mg Oral BID  . sodium chloride  3 mL Intravenous Q12H   Continuous Infusions:   Active Problems:   Hypertensive urgency    Time spent:79min    [facility]  [facility] Pager [phone]. If 7PM-7AM, please contact night-coverage at [email or web address], password [facility] 12/09/2013, 10:18 AM  LOS: 1 day

## 2013-12-09 NOTE — Consult Note (Signed)
[facility] Renal Consultation Note  Requesting MD: Broadus John Indication for Consultation:  Advanced CKD- failure to follow up in the past.   HPI:  Kenneth [name] is a 58 y.o. male with past medical history significant for poorly controlled malignant HTN, cardiomyopathy- possibly due to HTN or ETOH as well as advanced CKD.  Pt was encountered by [place] in 2012 when he was hospitalized for HTN/volume overload, creatinine of 4.7.  He was not uremic, arrangements were made for patient to follow up with nephrology at discharge but pt was lost to follow up.  At the time, he had increased echogenicity of his kidneys, minimal proteinuria, an abnormal SPEP with follow up recommended.  Apparently he has been relatively well since then although not taking any BP meds- he presented with dizziness, weakness- He was found to be in hypertensive emergency and amazingly still has a creatinine in the high 4's.  He has been in the ICU- BP has come down to acceptable levels with medical management.  He is having good UOP.  He really is without complaint today, doesn't seem like he is having anything I would call uremic symptoms.  Said he was walking 2 miles a day to lose weight. He denies urinary symptoms and denies use of NSAIDS.    Creatinine, Ser  Date/Time Value Ref Range Status  12/09/2013  4:00 AM 4.83* 0.50 - 1.35 mg/dL Final  12/08/2013  6:29 PM 5.10* 0.50 - 1.35 mg/dL Final  09/30/2011  6:49 AM 4.70* 0.50 - 1.35 mg/dL Final  09/29/2011  6:05 AM 5.08* 0.50 - 1.35 mg/dL Final  09/28/2011  6:40 AM 4.95* 0.50 - 1.35 mg/dL Final  09/27/2011  5:30 AM 5.08* 0.50 - 1.35 mg/dL Final  09/26/2011  5:55 AM 4.78* 0.50 - 1.35 mg/dL Final  09/25/2011  6:50 AM 4.81* 0.50 - 1.35 mg/dL Final  09/24/2011  5:25 AM 4.73* 0.50 - 1.35 mg/dL Final  09/23/2011  2:23 PM 4.76* 0.50 - 1.35 mg/dL Final  09/22/2011  4:20 AM 4.72* 0.50 - 1.35 mg/dL Final  09/21/2011  9:49 PM 4.72* 0.50 - 1.35 mg/dL Final  09/21/2011  5:06 PM 4.90*  0.50 - 1.35 mg/dL Final     PMHx:   Past Medical History  Diagnosis Date  . Hypertension   . CKD (chronic kidney disease) stage 4, GFR 15-29 ml/min     renal failure; due to hypertensive nephrosclerosis  . Chronic combined systolic and diastolic heart failure     Echocardiogram 09/22/11: Moderate LVH, EF 04-54%, grade 3 diastolic dysfunction, mild MR, moderate to severe LAE, mild RVE, mild to moderate TR, small to moderate pericardial effusion  . Cardiomyopathy secondary     likely related to HTN heart disease; possibly ETOH related as well  . History of alcohol abuse     History reviewed. No pertinent past surgical history.  Family Hx:  Family History  Problem Relation Age of Onset  . Emphysema Mother   . Cirrhosis Father     Social History:  reports that he has quit smoking. He uses smokeless tobacco. He reports that he drinks about 1.8 ounces of alcohol per week. He reports that he does not use illicit drugs.  Allergies: No Known Allergies  Medications: Prior to Admission medications   Medication Sig Start Date End Date Taking? Authorizing Provider  amLODipine (NORVASC) 10 MG tablet Take 1 tablet (10 mg total) by mouth daily. 09/24/11 09/23/12  Sorin June Leap, MD  carvedilol (COREG) 25 MG tablet Take 1 tablet (  25 mg total) by mouth 2 (two) times daily with a meal. 09/24/11 09/23/12  Sorin June Leap, MD  furosemide (LASIX) 80 MG tablet Take 80 mg by mouth daily.   09/30/11 09/29/12  Sorin June Leap, MD  hydrALAZINE (APRESOLINE) 25 MG tablet Take 1 tablet (25 mg total) by mouth 2 (two) times daily. 09/30/11 09/29/12  Sorin June Leap, MD    I have reviewed the patient's current medications.  Labs:  Results for orders placed during the hospital encounter of 12/08/13 (from the past 48 hour(s))  URINALYSIS, ROUTINE W REFLEX MICROSCOPIC     Status: Abnormal   Collection Time    12/08/13  5:57 PM      Result Value Ref Range   Color, Urine YELLOW  YELLOW   APPearance CLEAR  CLEAR    Specific Gravity, Urine 1.020  1.005 - 1.030   pH 6.0  5.0 - 8.0   Glucose, UA NEGATIVE  NEGATIVE mg/dL   Hgb urine dipstick SMALL (*) NEGATIVE   Bilirubin Urine NEGATIVE  NEGATIVE   Ketones, ur NEGATIVE  NEGATIVE mg/dL   Protein, ur 100 (*) NEGATIVE mg/dL   Urobilinogen, UA 0.2  0.0 - 1.0 mg/dL   Nitrite NEGATIVE  NEGATIVE   Leukocytes, UA NEGATIVE  NEGATIVE  URINE MICROSCOPIC-ADD ON     Status: None   Collection Time    12/08/13  5:57 PM      Result Value Ref Range   Squamous Epithelial / LPF RARE  RARE   WBC, UA 0-2  <3 WBC/hpf   RBC / HPF 0-2  <3 RBC/hpf  CBC WITH DIFFERENTIAL     Status: Abnormal   Collection Time    12/08/13  6:29 PM      Result Value Ref Range   WBC 5.1  4.0 - 10.5 K/uL   RBC 3.74 (*) 4.22 - 5.81 MIL/uL   Hemoglobin 11.7 (*) 13.0 - 17.0 g/dL   HCT 33.9 (*) 39.0 - 52.0 %   MCV 90.6  78.0 - 100.0 fL   MCH 31.3  26.0 - 34.0 pg   MCHC 34.5  30.0 - 36.0 g/dL   RDW 15.2  11.5 - 15.5 %   Platelets 216  150 - 400 K/uL   Neutrophils Relative % 50  43 - 77 %   Neutro Abs 2.5  1.7 - 7.7 K/uL   Lymphocytes Relative 34  12 - 46 %   Lymphs Abs 1.7  0.7 - 4.0 K/uL   Monocytes Relative 10  3 - 12 %   Monocytes Absolute 0.5  0.1 - 1.0 K/uL   Eosinophils Relative 6 (*) 0 - 5 %   Eosinophils Absolute 0.3  0.0 - 0.7 K/uL   Basophils Relative 1  0 - 1 %   Basophils Absolute 0.0  0.0 - 0.1 K/uL  BASIC METABOLIC PANEL     Status: Abnormal   Collection Time    12/08/13  6:29 PM      Result Value Ref Range   Sodium 143  137 - 147 mEq/L   Potassium 3.8  3.7 - 5.3 mEq/L   Chloride 105  96 - 112 mEq/L   CO2 22  19 - 32 mEq/L   Glucose, Bld 80  70 - 99 mg/dL   BUN 29 (*) 6 - 23 mg/dL   Creatinine, Ser 5.10 (*) 0.50 - 1.35 mg/dL   Calcium 9.0  8.4 - 10.5 mg/dL   GFR calc non Af Amer 11 (*) >  90 mL/min   GFR calc Af Amer 13 (*) >90 mL/min   Comment: (NOTE)     The eGFR has been calculated using the CKD EPI equation.     This calculation has not been validated in all  clinical situations.     eGFR's persistently <90 mL/min signify possible Chronic Kidney     Disease.  PRO B NATRIURETIC PEPTIDE     Status: Abnormal   Collection Time    12/08/13  6:29 PM      Result Value Ref Range   Pro B Natriuretic peptide (BNP) 5234.0 (*) 0 - 125 pg/mL  POCT I-STAT TROPONIN I     Status: None   Collection Time    12/08/13  6:45 PM      Result Value Ref Range   Troponin i, poc 0.04  0.00 - 0.08 ng/mL   Comment 3            Comment: Due to the release kinetics of cTnI,     a negative result within the first hours     of the onset of symptoms does not rule out     myocardial infarction with certainty.     If myocardial infarction is still suspected,     repeat the test at appropriate intervals.  MAGNESIUM     Status: None   Collection Time    12/08/13 10:56 PM      Result Value Ref Range   Magnesium 2.3  1.5 - 2.5 mg/dL  PHOSPHORUS     Status: None   Collection Time    12/08/13 10:56 PM      Result Value Ref Range   Phosphorus 4.5  2.3 - 4.6 mg/dL  TSH     Status: None   Collection Time    12/08/13 10:56 PM      Result Value Ref Range   TSH 3.517  0.350 - 4.500 uIU/mL   Comment: Performed at [facility]     Status: Abnormal   Collection Time    12/08/13 10:56 PM      Result Value Ref Range   Pro B Natriuretic peptide (BNP) 5715.0 (*) 0 - 125 pg/mL  HEMOGLOBIN A1C     Status: None   Collection Time    12/08/13 10:56 PM      Result Value Ref Range   Hemoglobin A1C 5.1  <5.7 %   Comment: (NOTE)                                                                               According to the ADA Clinical Practice Recommendations for 2011, when     HbA1c is used as a screening test:      >=6.5%   Diagnostic of Diabetes Mellitus               (if abnormal result is confirmed)     5.7-6.4%   Increased risk of developing Diabetes Mellitus     References:Diagnosis and Classification of Diabetes Mellitus,Diabetes      Care,2011,34(Suppl 1):S62-S69 and Standards of Medical Care in  Diabetes - 2011,Diabetes Care,2011,34 (Suppl 1):S11-S61.   Mean Plasma Glucose 100  <117 mg/dL   Comment: Performed at [facility]  TROPONIN I     Status: None   Collection Time    12/08/13 10:56 PM      Result Value Ref Range   Troponin I <0.30  <0.30 ng/mL   Comment:            Due to the release kinetics of cTnI,     a negative result within the first hours     of the onset of symptoms does not rule out     myocardial infarction with certainty.     If myocardial infarction is still suspected,     repeat the test at appropriate intervals.  URINALYSIS, ROUTINE W REFLEX MICROSCOPIC     Status: Abnormal   Collection Time    12/09/13 12:13 AM      Result Value Ref Range   Color, Urine YELLOW  YELLOW   APPearance CLEAR  CLEAR   Specific Gravity, Urine 1.012  1.005 - 1.030   pH 6.0  5.0 - 8.0   Glucose, UA NEGATIVE  NEGATIVE mg/dL   Hgb urine dipstick SMALL (*) NEGATIVE   Bilirubin Urine NEGATIVE  NEGATIVE   Ketones, ur NEGATIVE  NEGATIVE mg/dL   Protein, ur 100 (*) NEGATIVE mg/dL   Urobilinogen, UA 0.2  0.0 - 1.0 mg/dL   Nitrite NEGATIVE  NEGATIVE   Leukocytes, UA NEGATIVE  NEGATIVE  URINE RAPID DRUG SCREEN (HOSP PERFORMED)     Status: None   Collection Time    12/09/13 12:13 AM      Result Value Ref Range   Opiates NONE DETECTED  NONE DETECTED   Cocaine NONE DETECTED  NONE DETECTED   Benzodiazepines NONE DETECTED  NONE DETECTED   Amphetamines NONE DETECTED  NONE DETECTED   Tetrahydrocannabinol NONE DETECTED  NONE DETECTED   Barbiturates NONE DETECTED  NONE DETECTED   Comment:            DRUG SCREEN FOR MEDICAL PURPOSES     ONLY.  IF CONFIRMATION IS NEEDED     FOR ANY PURPOSE, NOTIFY LAB     WITHIN 5 DAYS.                LOWEST DETECTABLE LIMITS     FOR URINE DRUG SCREEN     Drug Class       Cutoff (ng/mL)     Amphetamine      1000     Barbiturate      200     Benzodiazepine   662      Tricyclics       947     Opiates          300     Cocaine          300     THC              50  URINE MICROSCOPIC-ADD ON     Status: None   Collection Time    12/09/13 12:13 AM      Result Value Ref Range   WBC, UA 0-2  <3 WBC/hpf   RBC / HPF 3-6  <3 RBC/hpf   Bacteria, UA RARE  RARE  MRSA PCR SCREENING     Status: None   Collection Time    12/09/13  2:16 AM      Result Value Ref Range   MRSA by  PCR NEGATIVE  NEGATIVE   Comment:            The GeneXpert MRSA Assay (FDA     approved for NASAL specimens     only), is one component of a     comprehensive MRSA colonization     surveillance program. It is not     intended to diagnose MRSA     infection nor to guide or     monitor treatment for     MRSA infections.  TROPONIN I     Status: None   Collection Time    12/09/13  4:00 AM      Result Value Ref Range   Troponin I <0.30  <0.30 ng/mL   Comment:            Due to the release kinetics of cTnI,     a negative result within the first hours     of the onset of symptoms does not rule out     myocardial infarction with certainty.     If myocardial infarction is still suspected,     repeat the test at appropriate intervals.  BASIC METABOLIC PANEL     Status: Abnormal   Collection Time    12/09/13  4:00 AM      Result Value Ref Range   Sodium 142  137 - 147 mEq/L   Potassium 3.9  3.7 - 5.3 mEq/L   Chloride 105  96 - 112 mEq/L   CO2 19  19 - 32 mEq/L   Glucose, Bld 87  70 - 99 mg/dL   BUN 32 (*) 6 - 23 mg/dL   Creatinine, Ser 4.83 (*) 0.50 - 1.35 mg/dL   Calcium 9.2  8.4 - 10.5 mg/dL   GFR calc non Af Amer 12 (*) >90 mL/min   GFR calc Af Amer 14 (*) >90 mL/min   Comment: (NOTE)     The eGFR has been calculated using the CKD EPI equation.     This calculation has not been validated in all clinical situations.     eGFR's persistently <90 mL/min signify possible Chronic Kidney     Disease.  CBC     Status: Abnormal   Collection Time    12/09/13  4:00 AM      Result  Value Ref Range   WBC 4.9  4.0 - 10.5 K/uL   RBC 3.64 (*) 4.22 - 5.81 MIL/uL   Hemoglobin 11.6 (*) 13.0 - 17.0 g/dL   HCT 33.1 (*) 39.0 - 52.0 %   MCV 90.9  78.0 - 100.0 fL   MCH 31.9  26.0 - 34.0 pg   MCHC 35.0  30.0 - 36.0 g/dL   RDW 15.3  11.5 - 15.5 %   Platelets 208  150 - 400 K/uL  TROPONIN I     Status: None   Collection Time    12/09/13 10:09 AM      Result Value Ref Range   Troponin I <0.30  <0.30 ng/mL   Comment:            Due to the release kinetics of cTnI,     a negative result within the first hours     of the onset of symptoms does not rule out     myocardial infarction with certainty.     If myocardial infarction is still suspected,     repeat the test at appropriate intervals.     ROS:  Constitutional: negative Respiratory: negative  Cardiovascular: positive for dizziness Gastrointestinal: negative Genitourinary:negative the remainder of the ROS are negative  Physical Exam: Filed Vitals:   12/09/13 1200  BP: 157/76  Pulse:   Temp: 97.8 F (36.6 C)  Resp: 22     General: well appearing BM, NAD HEENT: PERRLA, EOMI Neck: no JVD, no bruits Heart: RRR Lungs: mostly clear Abdomen: soft, non tender, non distended Extremities: no edema Skin: warm and dry Neuro: alert, somewhat distracted   Assessment/Plan: 58 year old BM with poorly controlled and malignant HTN likely leading to cardiomyopathy and advanced CKD- remarkable stable since 2012 even though has not been on meds or had any follow up.  1.Renal- advanced CKD without element of reversibility it seems.  I will recheck SPEP.  Likely due to long standing poorly controlled HTN. His GFR is low and he does not appear to be having any uremic symptoms.  If he did not have this history of noncompliance, I would probably be OK with OP follow up but because he has demonstrated inability to follow up, I would recommend AVF placement before discharge from the hospital as an insurance policy to avoid emergent  start of HD with a PC in his future.  I explained the rationale of my thinking.  I am not sure that he will go for having an invasive procedure if he doesn't understand why he needs it or doesn't think that he does need it. I will discuss it more with him tomorrow.  2. Hypertension/volume  - managed very well.  Is at an acceptable level.  Need to make sure do not get BP too low and cause ATN type issue.   He needs to understand that control of BP will be paramount to preserving kidney function as long as he can as well as maximizing his cardiac function.  His volume is good.  I'm not sure how compliant he will be able to be with a TID drug.  3. Anemia  - Will check iron stores and treat as needed 4. Bones- will evaluate for bone disease of CKD with PTH and treat as needed, phos is 4.5  Thank you for this consult.  I will continue to follow with you   Decari Duggar A 12/09/2013, 12:48 PM

## 2013-12-09 NOTE — Progress Notes (Signed)
Patient arrived on unit, transferred from [facility].  Patient alert and oriented x4.  Vital signs stable.  Patient states he has no questions or concerns at this time.  Will continue to monitor.

## 2013-12-09 NOTE — Progress Notes (Addendum)
   CARE MANAGEMENT NOTE 12/09/2013  Patient:  Kenneth [name], Kenneth [name]   Account Number:  [id number]  Date Initiated:  12/09/2013  Documentation initiated by:  Jonnie Finner  Subjective/Objective Assessment:   HTN urgency     Action/Plan:   [facility] -[name] NP (no longer at practice)   Anticipated DC Date:     Anticipated DC Plan:  [place]  CM consult      Choice offered to / List presented to:             Status of service:  In process, will continue to follow Medicare Important Message given?   (If response is "NO", the following Medicare IM given date fields will be blank) Date Medicare IM given:   Date Additional Medicare IM given:    Discharge Disposition:    Per UR Regulation:    If discussed at Long Length of Stay Meetings, dates discussed:    Comments:  12/09/2013 3:14 PM  NCM spoke to pt and states he does not have PCP. NCM explained he can contact [facility] toll free number and they will provide him with a provider list. States his wife has a PCP and he will check with her to see if they accept his insurance. Will provide pt with number for [facility] to locate a PCP. States he does not take meds regularly but has history of HTN. NCM explained the importance of follow up with his physician and taking meds as prescribed to help prevent medical complications. Attempted call to pt's wife for info on PCP. No answer. Call to [facility] and [name] NP no longer at practice. Pt last seen in 2012, they cannot see pt until pt speaks to pt accounting.  Jonnie Finner RN CCM Case Mgmt phone [phone]

## 2013-12-09 NOTE — Progress Notes (Signed)
[facility]  2D Echocardiogram completed.  [place], RDCS 12/09/2013 10:06 AM

## 2013-12-09 NOTE — Progress Notes (Signed)
CSW consulted: "medication needs." CSW informed RNCM.   Ky Barban, MSW, [facility] Clinical Social Worker [phone]

## 2013-12-09 NOTE — Progress Notes (Signed)
Utilization Review Completed.  

## 2013-12-10 LAB — IRON AND TIBC
Iron: 93 ug/dL (ref 42–135)
Saturation Ratios: 42 % (ref 20–55)
TIBC: 223 ug/dL (ref 215–435)
UIBC: 130 ug/dL (ref 125–400)

## 2013-12-10 LAB — CBC
HCT: 31.4 % — ABNORMAL LOW (ref 39.0–52.0)
Hemoglobin: 11 g/dL — ABNORMAL LOW (ref 13.0–17.0)
MCH: 31.8 pg (ref 26.0–34.0)
MCHC: 35 g/dL (ref 30.0–36.0)
MCV: 90.8 fL (ref 78.0–100.0)
Platelets: 222 K/uL (ref 150–400)
RBC: 3.46 MIL/uL — ABNORMAL LOW (ref 4.22–5.81)
RDW: 15.9 % — ABNORMAL HIGH (ref 11.5–15.5)
WBC: 4.7 K/uL (ref 4.0–10.5)

## 2013-12-10 LAB — BASIC METABOLIC PANEL
BUN: 39 mg/dL — AB (ref 6–23)
CALCIUM: 8.7 mg/dL (ref 8.4–10.5)
CO2: 20 meq/L (ref 19–32)
Chloride: 105 mEq/L (ref 96–112)
Creatinine, Ser: 5.42 mg/dL — ABNORMAL HIGH (ref 0.50–1.35)
GFR calc Af Amer: 12 mL/min — ABNORMAL LOW (ref >90)
GFR calc non Af Amer: 10 mL/min — ABNORMAL LOW (ref >90)
GLUCOSE: 88 mg/dL (ref 70–99)
Potassium: 3.9 mEq/L (ref 3.7–5.3)
SODIUM: 140 meq/L (ref 137–147)

## 2013-12-10 LAB — FERRITIN: Ferritin: 287 ng/mL (ref 22–322)

## 2013-12-10 MED ORDER — CARVEDILOL 25 MG PO TABS: 25.0000 mg | ORAL_TABLET | Freq: Two times a day (BID) | ORAL | Status: DC

## 2013-12-10 MED ORDER — HYDRALAZINE HCL 50 MG PO TABS
50.0000 mg | ORAL_TABLET | Freq: Two times a day (BID) | ORAL | Status: DC
Start: 2013-12-10 — End: 2014-02-14

## 2013-12-10 MED ORDER — AMLODIPINE BESYLATE 10 MG PO TABS: 10.0000 mg | ORAL_TABLET | Freq: Every day | ORAL | Status: DC

## 2013-12-10 MED ORDER — FUROSEMIDE 40 MG PO TABS: 40.0000 mg | ORAL_TABLET | Freq: Every day | ORAL | Status: DC

## 2013-12-10 MED ORDER — HYDRALAZINE HCL 50 MG PO TABS
50.0000 mg | ORAL_TABLET | Freq: Two times a day (BID) | ORAL | Status: DC
  Filled 2013-12-10 (×2): qty 1

## 2013-12-10 MED ORDER — FUROSEMIDE 40 MG PO TABS
40.0000 mg | ORAL_TABLET | Freq: Every day | ORAL | Status: DC
  Administered 2013-12-10: 40 mg via ORAL
  Filled 2013-12-10: qty 1

## 2013-12-10 NOTE — ED Provider Notes (Signed)
Medical screening examination/treatment/procedure(s) were conducted as a shared visit with non-physician practitioner(s) and myself.  I personally evaluated the patient during the encounter.      Houston Siren III, MD 12/10/13 1455

## 2013-12-10 NOTE — ED Provider Notes (Signed)
Medical screening examination/treatment/procedure(s) were conducted as a shared visit with non-physician practitioner(s) and myself.  I personally evaluated the patient during the encounter.    58 yo male with hx of HTN, not currently on meds, presenting from UC secondary to hypertension.  BPs initially 250/150.  He denied any symptoms associated with his hypertension.  On exam, well appearing, nontoxic, no distress, alert and oriented, lungs CTAB, normal respiratory effort, heart sounds normal without m/r/g, abdomen soft and nontender.  However, workup concerning for worsening EKG changes, worsening renal function, elevated BNP, and pulm vascular congestion.  Admitted to hospitalist for further management.  Given IV labetalol with some improvement in BP.    Clinical Impression: Hypertensive Urgency   Houston Siren III, MD 12/10/13 [phone]

## 2013-12-10 NOTE — Plan of Care (Signed)
Problem: Not Ready for Diet/Lifestyle Change (NB-1.3) Goal: Nutrition education Formal process to instruct or train a patient/client in a skill or to impart knowledge to help patients/clients voluntarily manage or modify food choices and eating behavior to maintain or improve health. Outcome: Not Applicable Date Met:  01/48/40  Nutrition Education Note  RD consulted for Renal Education via phone by RN. Provided Chronic Kidney Disease Pyramid and Low Sodium Nutrition Therapy handout to patient. Pt did not make eye contact with RD. Pt reports that he doesn't follow any diet restrictions. Discussed need for low salt - discouraged use of salt shaker and salty foods, etc. Also reviewed limiting dark sodas. The only question that the patient asked was how much beer he could drink daily. Encouraged no more than 12 oz daily. Discussed education attempt with RN.  Expect poor compliance.  Body mass index is 26.98 kg/(m^2). Pt meets criteria for overweight based on current BMI.  Current diet order is Regular, patient is consuming approximately 100% of meals at this time. Labs and medications reviewed. No further nutrition interventions warranted at this time. RD contact information provided. If additional nutrition issues arise, please re-consult RD.  RECOMMEND LOW SODIUM DIET WHILE INPATIENT.  Kenneth Coke MS, RD, LDN Inpatient Registered Dietitian Pager: [phone] After-hours pager: [phone]

## 2013-12-10 NOTE — Progress Notes (Signed)
Subjective:  Transferred to lower level of care- BP is at a good place for his situation.  It appears that UOP may have decreased but not sure is accurate- creatinine up a little.  As suspected, pt says he refuses to have AVF this hospitalization and says that he will follow up with me.  "I'll let it happen when it happen"  Has concerns about work, wants to go home Objective Vital signs in last 24 hours: Filed Vitals:   12/09/13 1815 12/09/13 2022 12/10/13 0206 12/10/13 0623  BP: 170/72 163/79 143/74 455/69  Pulse:  63 61 57  Temp: 98.2 F (36.8 C) 97.8 F (36.6 C) 98.9 F (37.2 C) 98.4 F (36.9 C)  TempSrc: Oral Oral Oral Oral  Resp: 18 20 18 20   Height:      Weight: 90.7 kg (199 lb 15.3 oz)   90.266 kg (199 lb)  SpO2: 99% 100% 100% 98%   Weight change: 0.2 kg (7.1 oz)  Intake/Output Summary (Last 24 hours) at 12/10/13 0919 Last data filed at 12/10/13 0913  Gross per 24 hour  Intake    960 ml  Output    500 ml  Net    460 ml    Assessment/Plan: 58 year old BM with poorly controlled and malignant HTN likely leading to cardiomyopathy and advanced CKD- remarkable stable since 2012 even though has not been on meds or had any follow up.  1.Renal- advanced CKD without element of reversibility it seems. Repeat SPEP pending as it was not entirely negative in 2012. CKD likely more due to long standing poorly controlled HTN. His GFR is low but does not appear to be having any uremic symptoms. I have recommended AVF placement before discharge from the hospital as an insurance policy to avoid emergent start of HD with a PC in his future but patient refuses.  I am not sure that he will follow up with me in the office but I attempted to explain the importance and I could continue to follow him and work on the preparations for dialysis.  I have contact numbers for him so we can work on OP appt.  2. Hypertension/volume - managed very well for situation. Is at an acceptable level. Need to try and make  sure do not get BP too low and cause ATN type issue. He needs to understand that control of BP will be paramount to preserving the kidney function he has remaining as well as maximizing his cardiac function. His volume is good. He is on a good BP regimen- would change hydralazine to BID to improve compliance and add lasix 40 mg daily.  I guess he is stable for discharge on these meds- hopefully he will follow up with me and he needs a PCP as well- if there is any kind of safeguard that can be put in place so that patient does not fall through the cracks, it would be beneficial.  3. Anemia - Iron stores are pending , I can follow as OP 4. Bones- will evaluate for bone disease of CKD PTH pending,  phos is 4.5   Taneika Choi A    Labs: Basic Metabolic Panel:  Recent Labs Lab 12/08/13 1829 12/08/13 2256 12/09/13 0400 12/10/13 0519  NA 143  --  142 140  K 3.8  --  3.9 3.9  CL 105  --  105 105  CO2 22  --  19 20  GLUCOSE 80  --  87 88  BUN 29*  --  32* 39*  CREATININE 5.10*  --  4.83* 5.42*  CALCIUM 9.0  --  9.2 8.7  PHOS  --  4.5  --   --    Liver Function Tests: No results found for this basename: AST, ALT, ALKPHOS, BILITOT, PROT, ALBUMIN,  in the last 168 hours No results found for this basename: LIPASE, AMYLASE,  in the last 168 hours No results found for this basename: AMMONIA,  in the last 168 hours CBC:  Recent Labs Lab 12/08/13 1829 12/09/13 0400 12/10/13 0519  WBC 5.1 4.9 4.7  NEUTROABS 2.5  --   --   HGB 11.7* 11.6* 11.0*  HCT 33.9* 33.1* 31.4*  MCV 90.6 90.9 90.8  PLT 216 208 222   Cardiac Enzymes:  Recent Labs Lab 12/08/13 2256 12/09/13 0400 12/09/13 1009  TROPONINI <0.30 <0.30 <0.30   CBG: No results found for this basename: GLUCAP,  in the last 168 hours  Iron Studies: No results found for this basename: IRON, TIBC, TRANSFERRIN, FERRITIN,  in the last 72 hours Studies/Results: Dg Chest Port 1 View  12/08/2013   CLINICAL DATA:  58 year old  male hypertension dizziness, cardiomyopathy. Initial encounter.  EXAM: PORTABLE CHEST - 1 VIEW  COMPARISON:  09/21/2011.  FINDINGS: Portable AP semi upright view at 1855 hrs. Progressed cardiomegaly. Globular contour of the heart. Other mediastinal contours are within normal limits. Visualized tracheal air column is within normal limits. Pulmonary vascular congestion. Trace pleural fluid along the right minor fissure. No overt pulmonary edema. No other pleural effusion evident. No pneumothorax. No consolidation.  IMPRESSION: Progressed cardiomegaly. Globular contour of the heart such that pericardial effusion is not excluded. Increased pulmonary vascular congestion and trace pleural fluid without overt pulmonary edema.   Electronically Signed   By: Lars Pinks M.D.   On: 12/08/2013 19:05   Medications: Infusions:    Scheduled Medications: . amLODipine  10 mg Oral Daily  . carvedilol  25 mg Oral BID WC  . heparin  5,000 Units Subcutaneous 3 times per day  . hydrALAZINE  50 mg Oral 3 times per day  . sodium chloride  3 mL Intravenous Q12H    have reviewed scheduled and prn medications.  Physical Exam: General: NAD, does not make eye contact Heart: RRR Lungs: clear Abdomen: soft, non tender, non distended Extremities: no edema    12/10/2013,9:19 AM  LOS: 2 days

## 2013-12-10 NOTE — Discharge Summary (Signed)
Physician Discharge Summary  Kenneth [name] [id number] DOB: 1956/06/02 DOA: 12/08/2013  PCP: [name], NP  Admit date: 12/08/2013 Discharge date: 12/10/2013  Time spent: 45 minutes  Recommendations for Outpatient Follow-up:  1. Dr.Goldsborough with [facility] in 7-10days 2. [facility] in 1 week  Discharge Diagnoses:  Active Problems:   Hypertensive emergency   Acute Diastolic CHF   CKD 4-5   Anemia of chronic disease   Diastolic dysfunction   ETOH use   Poor complaince  Discharge Condition: stable  Diet recommendation: renal  Filed Weights   12/09/13 0214 12/09/13 1815 12/10/13 [id number]  Weight: 90.5 kg (199 lb 8.3 oz) 90.7 kg (199 lb 15.3 oz) 90.266 kg (199 lb)    History of present illness:  HPI:  Patient is 58 year old male with history of hypertension, hyperlipidemia, combined congestive heart failure, ejection fraction 30-35% with grade 3 diastolic dysfunction, chronic kidney disease stage 3-4, medical noncompliance who presents to [facility] emergency department with main concern of two-week duration of dizziness, generalized weakness. He explains his symptoms are worse with standing up and exertion, somewhat improved with rest. Patient also reports associated generalized headaches, throbbing and pressure-like, 5/10 in severity, nonradiating, no specific alleviating or aggravating factors. He reports checking blood pressure last week and it was higher than 190/120. He has not taken medications over a year ago as he is not able to afford it. He currently denies chest pain, no shortness of breath, no specific abdominal or urinary concerns.  In emergency department, patient found to be hemodynamically stable, blood pressure 220/120. Creatinine 5.1 which is above his last baseline in 2012 (4.71).    Hospital Course:  1. Hypertensive emergency  -BP improved on PO regimen and received IV labetalol/hydralazine  -had ran out of his medications for 2 years -continued on  coreg, amlodipine,hydralazine to 50mg  BID -CM consulted for assistance with finding PCP/meds   2. Acute diastolic CHF -improved with diuresis using IV lasix and BP control -ECHO with preserved EF and diastolic dysfunction -home on coreg and lasix  3. CKD 4  -chronic, surprising creatinine about the same since 2012  -lost to FU with nephrology  -seen by Dr.Goldsborough who recommended BP control and Access for HD for the future -pt declined AVF at this time, reports that he will FU with Renal  4. ETOh use  -counseled, thiamine  -no withdrawal noted     Procedures:  ECHO  Consultations:  renal  Discharge Exam: Filed Vitals:   12/10/13 1006  BP: 160/90  Pulse:   Temp:   Resp:     General: AAOx3 Cardiovascular:S1S2/RRR Respiratory: CTAB  Discharge Instructions  Discharge Orders   Future Orders Complete By Expires   Discharge instructions  As directed    Comments:     Renal Diet       Medication List         amLODipine 10 MG tablet  Commonly known as:  NORVASC  Take 1 tablet (10 mg total) by mouth daily.     carvedilol 25 MG tablet  Commonly known as:  COREG  Take 1 tablet (25 mg total) by mouth 2 (two) times daily with a meal.     furosemide 40 MG tablet  Commonly known as:  LASIX  Take 1 tablet (40 mg total) by mouth daily.     hydrALAZINE 50 MG tablet  Commonly known as:  APRESOLINE  Take 1 tablet (50 mg total) by mouth 2 (two) times daily.  No Known Allergies     Follow-up Information   Follow up with GOLDSBOROUGH,KELLIE A, MD. Schedule an appointment as soon as possible for a visit in 2 weeks.   Specialty:  Nephrology   Contact information:   [address] [place] [address] [phone]        The results of significant diagnostics from this hospitalization (including imaging, microbiology, ancillary and laboratory) are listed below for reference.    Significant Diagnostic Studies: Dg Chest Port 1 View  12/08/2013    CLINICAL DATA:  58 year old male hypertension dizziness, cardiomyopathy. Initial encounter.  EXAM: PORTABLE CHEST - 1 VIEW  COMPARISON:  09/21/2011.  FINDINGS: Portable AP semi upright view at 1855 hrs. Progressed cardiomegaly. Globular contour of the heart. Other mediastinal contours are within normal limits. Visualized tracheal air column is within normal limits. Pulmonary vascular congestion. Trace pleural fluid along the right minor fissure. No overt pulmonary edema. No other pleural effusion evident. No pneumothorax. No consolidation.  IMPRESSION: Progressed cardiomegaly. Globular contour of the heart such that pericardial effusion is not excluded. Increased pulmonary vascular congestion and trace pleural fluid without overt pulmonary edema.   Electronically Signed   By: Lars Pinks M.D.   On: 12/08/2013 19:05    Microbiology: Recent Results (from the past 240 hour(s))  MRSA PCR SCREENING     Status: None   Collection Time    12/09/13  2:16 AM      Result Value Ref Range Status   MRSA by PCR NEGATIVE  NEGATIVE Final   Comment:            The GeneXpert MRSA Assay (FDA     approved for NASAL specimens     only), is one component of a     comprehensive MRSA colonization     surveillance program. It is not     intended to diagnose MRSA     infection nor to guide or     monitor treatment for     MRSA infections.     Labs: Basic Metabolic Panel:  Recent Labs Lab 12/08/13 1829 12/08/13 2256 12/09/13 0400 12/10/13 0519  NA 143  --  142 140  K 3.8  --  3.9 3.9  CL 105  --  105 105  CO2 22  --  19 20  GLUCOSE 80  --  87 88  BUN 29*  --  32* 39*  CREATININE 5.10*  --  4.83* 5.42*  CALCIUM 9.0  --  9.2 8.7  MG  --  2.3  --   --   PHOS  --  4.5  --   --    Liver Function Tests: No results found for this basename: AST, ALT, ALKPHOS, BILITOT, PROT, ALBUMIN,  in the last 168 hours No results found for this basename: LIPASE, AMYLASE,  in the last 168 hours No results found for this  basename: AMMONIA,  in the last 168 hours CBC:  Recent Labs Lab 12/08/13 1829 12/09/13 0400 12/10/13 0519  WBC 5.1 4.9 4.7  NEUTROABS 2.5  --   --   HGB 11.7* 11.6* 11.0*  HCT 33.9* 33.1* 31.4*  MCV 90.6 90.9 90.8  PLT 216 208 222   Cardiac Enzymes:  Recent Labs Lab 12/08/13 2256 12/09/13 0400 12/09/13 1009  TROPONINI <0.30 <0.30 <0.30   BNP: BNP (last 3 results)  Recent Labs  12/08/13 1829 12/08/13 2256  PROBNP 5234.0* 5715.0*   CBG: No results found for this basename: GLUCAP,  in the  last 168 hours     Signed:  Korianna Washer  [facility] 12/10/2013, 1:10 PM

## 2013-12-12 LAB — PARATHYROID HORMONE, INTACT (NO CA): PTH: 307 pg/mL — ABNORMAL HIGH (ref 14.0–72.0)

## 2013-12-14 LAB — PROTEIN ELECTROPHORESIS, SERUM
Albumin ELP: 51.3 % — ABNORMAL LOW (ref 55.8–66.1)
Alpha-1-Globulin: 4.2 % (ref 2.9–4.9)
Alpha-2-Globulin: 8.5 % (ref 7.1–11.8)
Beta 2: 5.6 % (ref 3.2–6.5)
Beta Globulin: 5.5 % (ref 4.7–7.2)
GAMMA GLOBULIN: 24.9 % — AB (ref 11.1–18.8)
M-SPIKE, %: NOT DETECTED g/dL
TOTAL PROTEIN ELP: 6.7 g/dL (ref 6.0–8.3)

## 2014-02-14 ENCOUNTER — Ambulatory Visit (INDEPENDENT_AMBULATORY_CARE_PROVIDER_SITE_OTHER): Payer: BC Managed Care – PPO | Admitting: Emergency Medicine

## 2014-02-14 VITALS — BP 212/102 | HR 56 | Temp 98.2°F | Resp 16 | Ht 70.0 in | Wt 203.6 lb

## 2014-02-14 DIAGNOSIS — I10 Essential (primary) hypertension: Secondary | ICD-10-CM

## 2014-02-14 MED ORDER — HYDRALAZINE HCL 50 MG PO TABS: 50.0000 mg | ORAL_TABLET | Freq: Two times a day (BID) | ORAL | Status: DC

## 2014-02-14 MED ORDER — CARVEDILOL 25 MG PO TABS: 25.0000 mg | ORAL_TABLET | Freq: Two times a day (BID) | ORAL | Status: DC

## 2014-02-14 MED ORDER — FUROSEMIDE 40 MG PO TABS: 40.0000 mg | ORAL_TABLET | Freq: Every day | ORAL | Status: DC

## 2014-02-14 MED ORDER — AMLODIPINE BESYLATE 10 MG PO TABS: 10.0000 mg | ORAL_TABLET | Freq: Every day | ORAL | Status: DC

## 2014-02-14 NOTE — Patient Instructions (Signed)

## 2014-02-14 NOTE — Progress Notes (Signed)
[facility] [address] [place]  [address] [phone]  Date:  02/14/2014   Name:  Kenneth [name]   DOB:  1956-02-20   MRN:  [id number]  PCP:  [name], NP    Chief Complaint: Medication Refill   History of Present Illness:  Kenneth [name] is a 58 y.o. very pleasant male patient who presents with the following:  Says that he had a DOT physical 3 weeks ago and hie BP was 130/68 and two weeks ago, he ran out of medication.  Now has BP of 212/102.  Denies symptoms.  Poorly compliant with health care guidance.  Non smoker.  No improvement with over the counter medications or other home remedies. Denies other complaint or health concern today.   Patient Active Problem List   Diagnosis Date Noted  . Hypertensive urgency 12/08/2013  . Cardiomyopathy secondary 10/09/2011  . Hypertension 10/09/2011  . Alcohol withdrawal delirium 09/25/2011  . CKD (chronic kidney disease), stage IV 09/24/2011  . Systolic and diastolic CHF, acute on chronic 09/24/2011    Past Medical History  Diagnosis Date  . Hypertension   . CKD (chronic kidney disease) stage 4, GFR 15-29 ml/min     renal failure; due to hypertensive nephrosclerosis  . Chronic combined systolic and diastolic heart failure     Echocardiogram 09/22/11: Moderate LVH, EF 99991111, grade 3 diastolic dysfunction, mild MR, moderate to severe LAE, mild RVE, mild to moderate TR, small to moderate pericardial effusion  . Cardiomyopathy secondary     likely related to HTN heart disease; possibly ETOH related as well  . History of alcohol abuse     History reviewed. No pertinent past surgical history.  History  Substance Use Topics  . Smoking status: Former Research scientist (life sciences)  . Smokeless tobacco: Current User  . Alcohol Use: 1.8 oz/week    3 Cans of beer per week     Comment: occ    Family History  Problem Relation Age of Onset  . Emphysema Mother   . Cirrhosis Father     No Known Allergies  Medication list has been  reviewed and updated.  Current Outpatient Prescriptions on File Prior to Visit  Medication Sig Dispense Refill  . amLODipine (NORVASC) 10 MG tablet Take 1 tablet (10 mg total) by mouth daily.  30 tablet  1  . carvedilol (COREG) 25 MG tablet Take 1 tablet (25 mg total) by mouth 2 (two) times daily with a meal.  60 tablet  1  . furosemide (LASIX) 40 MG tablet Take 1 tablet (40 mg total) by mouth daily.  30 tablet  1  . hydrALAZINE (APRESOLINE) 50 MG tablet Take 1 tablet (50 mg total) by mouth 2 (two) times daily.  60 tablet  1   No current facility-administered medications on file prior to visit.    Review of Systems:  As per HPI, otherwise negative.    Physical Examination: Filed Vitals:   02/14/14 1800  BP: 212/102  Pulse: 56  Temp: 98.2 F (36.8 C)  Resp: 16   Filed Vitals:   02/14/14 1800  Height: 5\' 10"  (1.778 m)  Weight: 203 lb 9.6 oz (92.352 kg)   Body mass index is 29.21 kg/(m^2). Ideal Body Weight: Weight in (lb) to have BMI = 25: 173.9  GEN: WDWN, NAD, Non-toxic, A & O x 3 HEENT: Atraumatic, Normocephalic. Neck supple. No masses, No LAD. Ears and Nose: No external deformity. CV: RRR, No M/G/R. No JVD. No thrill. No extra heart  sounds. PULM: CTA B, no wheezes, crackles, rhonchi. No retractions. No resp. distress. No accessory muscle use. ABD: S, NT, ND, +BS. No rebound. No HSM. EXTR: No c/c/e NEURO Normal gait.  PSYCH: Normally interactive. Conversant. Not depressed or anxious appearing.  Calm demeanor.    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  Will follow up in the nex 4-8 weeks for recheck. Signed,  Ellison Carwin, MD

## 2016-05-12 ENCOUNTER — Inpatient Hospital Stay (HOSPITAL_COMMUNITY): Payer: Medicaid Other

## 2016-05-12 ENCOUNTER — Encounter (HOSPITAL_COMMUNITY): Payer: Self-pay

## 2016-05-12 ENCOUNTER — Inpatient Hospital Stay (HOSPITAL_COMMUNITY)
Admission: EM | Admit: 2016-05-12 | Discharge: 2016-05-22 | DRG: 252 | Disposition: A | Discharge status: Home / Self Care | Payer: Medicaid Other | Attending: Internal Medicine | Admitting: Internal Medicine

## 2016-05-12 ENCOUNTER — Emergency Department (HOSPITAL_COMMUNITY): Payer: Medicaid Other

## 2016-05-12 DIAGNOSIS — E872 Acidosis: Secondary | ICD-10-CM | POA: Diagnosis present

## 2016-05-12 DIAGNOSIS — Z992 Dependence on renal dialysis: Secondary | ICD-10-CM

## 2016-05-12 DIAGNOSIS — I5043 Acute on chronic combined systolic (congestive) and diastolic (congestive) heart failure: Secondary | ICD-10-CM | POA: Diagnosis present

## 2016-05-12 DIAGNOSIS — Z720 Tobacco use: Secondary | ICD-10-CM | POA: Diagnosis not present

## 2016-05-12 DIAGNOSIS — I4892 Unspecified atrial flutter: Secondary | ICD-10-CM | POA: Diagnosis present

## 2016-05-12 DIAGNOSIS — N185 Chronic kidney disease, stage 5: Secondary | ICD-10-CM

## 2016-05-12 DIAGNOSIS — I272 Other secondary pulmonary hypertension: Secondary | ICD-10-CM | POA: Diagnosis present

## 2016-05-12 DIAGNOSIS — G9340 Encephalopathy, unspecified: Secondary | ICD-10-CM | POA: Diagnosis not present

## 2016-05-12 DIAGNOSIS — I132 Hypertensive heart and chronic kidney disease with heart failure and with stage 5 chronic kidney disease, or end stage renal disease: Secondary | ICD-10-CM | POA: Diagnosis present

## 2016-05-12 DIAGNOSIS — Z79899 Other long term (current) drug therapy: Secondary | ICD-10-CM

## 2016-05-12 DIAGNOSIS — N186 End stage renal disease: Secondary | ICD-10-CM | POA: Diagnosis present

## 2016-05-12 DIAGNOSIS — I160 Hypertensive urgency: Secondary | ICD-10-CM | POA: Diagnosis not present

## 2016-05-12 DIAGNOSIS — I4891 Unspecified atrial fibrillation: Secondary | ICD-10-CM | POA: Diagnosis present

## 2016-05-12 DIAGNOSIS — N179 Acute kidney failure, unspecified: Secondary | ICD-10-CM | POA: Diagnosis present

## 2016-05-12 DIAGNOSIS — R5383 Other fatigue: Secondary | ICD-10-CM

## 2016-05-12 DIAGNOSIS — N184 Chronic kidney disease, stage 4 (severe): Secondary | ICD-10-CM | POA: Diagnosis present

## 2016-05-12 DIAGNOSIS — Z6826 Body mass index (BMI) 26.0-26.9, adult: Secondary | ICD-10-CM | POA: Diagnosis not present

## 2016-05-12 DIAGNOSIS — N19 Unspecified kidney failure: Secondary | ICD-10-CM

## 2016-05-12 DIAGNOSIS — I429 Cardiomyopathy, unspecified: Secondary | ICD-10-CM | POA: Diagnosis present

## 2016-05-12 DIAGNOSIS — M549 Dorsalgia, unspecified: Secondary | ICD-10-CM

## 2016-05-12 DIAGNOSIS — R7989 Other specified abnormal findings of blood chemistry: Secondary | ICD-10-CM

## 2016-05-12 DIAGNOSIS — D631 Anemia in chronic kidney disease: Secondary | ICD-10-CM | POA: Diagnosis present

## 2016-05-12 DIAGNOSIS — I5032 Chronic diastolic (congestive) heart failure: Secondary | ICD-10-CM | POA: Diagnosis present

## 2016-05-12 DIAGNOSIS — F1020 Alcohol dependence, uncomplicated: Secondary | ICD-10-CM | POA: Diagnosis present

## 2016-05-12 DIAGNOSIS — R0602 Shortness of breath: Secondary | ICD-10-CM

## 2016-05-12 DIAGNOSIS — E46 Unspecified protein-calorie malnutrition: Secondary | ICD-10-CM | POA: Diagnosis present

## 2016-05-12 DIAGNOSIS — E785 Hyperlipidemia, unspecified: Secondary | ICD-10-CM | POA: Diagnosis present

## 2016-05-12 DIAGNOSIS — I509 Heart failure, unspecified: Secondary | ICD-10-CM

## 2016-05-12 DIAGNOSIS — Z9119 Patient's noncompliance with other medical treatment and regimen: Secondary | ICD-10-CM

## 2016-05-12 DIAGNOSIS — I10 Essential (primary) hypertension: Secondary | ICD-10-CM | POA: Diagnosis present

## 2016-05-12 DIAGNOSIS — I5042 Chronic combined systolic (congestive) and diastolic (congestive) heart failure: Secondary | ICD-10-CM

## 2016-05-12 LAB — COMPREHENSIVE METABOLIC PANEL
ALBUMIN: 4 g/dL (ref 3.5–5.0)
ALT: 79 U/L — ABNORMAL HIGH (ref 17–63)
AST: 73 U/L — AB (ref 15–41)
Alkaline Phosphatase: 59 U/L (ref 38–126)
Anion gap: 15 (ref 5–15)
BUN: 120 mg/dL — AB (ref 6–20)
CHLORIDE: 105 mmol/L (ref 101–111)
CO2: 13 mmol/L — AB (ref 22–32)
Calcium: 8 mg/dL — ABNORMAL LOW (ref 8.9–10.3)
Creatinine, Ser: 13.03 mg/dL — ABNORMAL HIGH (ref 0.61–1.24)
GFR calc Af Amer: 4 mL/min — ABNORMAL LOW (ref >60)
GFR calc non Af Amer: 4 mL/min — ABNORMAL LOW (ref >60)
Glucose, Bld: 87 mg/dL (ref 65–99)
Potassium: 4.7 mmol/L (ref 3.5–5.1)
Sodium: 133 mmol/L — ABNORMAL LOW (ref 135–145)
Total Bilirubin: 1.1 mg/dL (ref 0.3–1.2)
Total Protein: 8.5 g/dL — ABNORMAL HIGH (ref 6.5–8.1)

## 2016-05-12 LAB — CBC WITH DIFFERENTIAL/PLATELET
BASOS PCT: 1 %
Basophils Absolute: 0 10*3/uL (ref 0.0–0.1)
Eosinophils Absolute: 0.2 10*3/uL (ref 0.0–0.7)
Eosinophils Relative: 5 %
HEMATOCRIT: 27.7 % — AB (ref 39.0–52.0)
Hemoglobin: 9.7 g/dL — ABNORMAL LOW (ref 13.0–17.0)
Lymphocytes Relative: 16 %
Lymphs Abs: 0.8 10*3/uL (ref 0.7–4.0)
MCH: 30.6 pg (ref 26.0–34.0)
MCHC: 35 g/dL (ref 30.0–36.0)
MCV: 87.4 fL (ref 78.0–100.0)
MONO ABS: 0.3 10*3/uL (ref 0.1–1.0)
MONOS PCT: 6 %
NEUTROS ABS: 3.7 10*3/uL (ref 1.7–7.7)
Neutrophils Relative %: 72 %
Platelets: 185 10*3/uL (ref 150–400)
RBC: 3.17 MIL/uL — ABNORMAL LOW (ref 4.22–5.81)
RDW: 16.2 % — ABNORMAL HIGH (ref 11.5–15.5)
WBC: 5.1 10*3/uL (ref 4.0–10.5)

## 2016-05-12 LAB — CREATININE, SERUM
Creatinine, Ser: 13.01 mg/dL — ABNORMAL HIGH (ref 0.61–1.24)
GFR calc non Af Amer: 4 mL/min — ABNORMAL LOW (ref >60)
GFR, EST AFRICAN AMERICAN: 4 mL/min — AB (ref >60)

## 2016-05-12 LAB — URINALYSIS, ROUTINE W REFLEX MICROSCOPIC
Bilirubin Urine: NEGATIVE
Glucose, UA: NEGATIVE mg/dL
KETONES UR: NEGATIVE mg/dL
LEUKOCYTES UA: NEGATIVE
NITRITE: NEGATIVE
PH: 5 (ref 5.0–8.0)
Protein, ur: 100 mg/dL — AB
Specific Gravity, Urine: 1.014 (ref 1.005–1.030)

## 2016-05-12 LAB — URINE MICROSCOPIC-ADD ON

## 2016-05-12 LAB — CBC
HCT: 25.6 % — ABNORMAL LOW (ref 39.0–52.0)
HEMOGLOBIN: 8.7 g/dL — AB (ref 13.0–17.0)
MCH: 30.2 pg (ref 26.0–34.0)
MCHC: 34 g/dL (ref 30.0–36.0)
MCV: 88.9 fL (ref 78.0–100.0)
PLATELETS: 155 10*3/uL (ref 150–400)
RBC: 2.88 MIL/uL — AB (ref 4.22–5.81)
RDW: 16.4 % — ABNORMAL HIGH (ref 11.5–15.5)
WBC: 5.4 10*3/uL (ref 4.0–10.5)

## 2016-05-12 LAB — RAPID URINE DRUG SCREEN, HOSP PERFORMED
AMPHETAMINES: NOT DETECTED
BENZODIAZEPINES: NOT DETECTED
Barbiturates: NOT DETECTED
COCAINE: NOT DETECTED
Opiates: NOT DETECTED
TETRAHYDROCANNABINOL: NOT DETECTED

## 2016-05-12 LAB — ECHOCARDIOGRAM COMPLETE
Height: 70 in
Weight: 2880 oz

## 2016-05-12 LAB — TROPONIN I
TROPONIN I: 0.23 ng/mL — AB (ref <0.03)
Troponin I: 0.32 ng/mL (ref <0.03)
Troponin I: 0.25 ng/mL (ref <0.03)

## 2016-05-12 LAB — VITAMIN B12: Vitamin B-12: 1136 pg/mL — ABNORMAL HIGH (ref 180–914)

## 2016-05-12 LAB — BRAIN NATRIURETIC PEPTIDE: B Natriuretic Peptide: 2191.8 pg/mL — ABNORMAL HIGH (ref 0.0–100.0)

## 2016-05-12 LAB — IRON AND TIBC
IRON: 86 ug/dL (ref 45–182)
Saturation Ratios: 31 % (ref 17.9–39.5)
TIBC: 274 ug/dL (ref 250–450)
UIBC: 188 ug/dL

## 2016-05-12 LAB — PHOSPHORUS: Phosphorus: 8 mg/dL — ABNORMAL HIGH (ref 2.5–4.6)

## 2016-05-12 LAB — FERRITIN: Ferritin: 287 ng/mL (ref 24–336)

## 2016-05-12 MED ORDER — THIAMINE HCL 100 MG/ML IJ SOLN: 100.0000 mg | Freq: Every day | INTRAMUSCULAR | Status: DC

## 2016-05-12 MED ORDER — SODIUM CHLORIDE 0.9 % IV SOLN
INTRAVENOUS | Status: DC
  Administered 2016-05-12 – 2016-05-14 (×5): via INTRAVENOUS

## 2016-05-12 MED ORDER — IPRATROPIUM-ALBUTEROL 0.5-2.5 (3) MG/3ML IN SOLN
3.0000 mL | Freq: Four times a day (QID) | RESPIRATORY_TRACT | Status: DC
  Administered 2016-05-12 (×2): 3 mL via RESPIRATORY_TRACT
  Filled 2016-05-12: qty 3

## 2016-05-12 MED ORDER — METOPROLOL TARTRATE 25 MG PO TABS
25.0000 mg | ORAL_TABLET | Freq: Two times a day (BID) | ORAL | Status: DC
  Administered 2016-05-12 – 2016-05-13 (×3): 25 mg via ORAL
  Filled 2016-05-12 (×3): qty 1

## 2016-05-12 MED ORDER — FOLIC ACID 1 MG PO TABS
1.0000 mg | ORAL_TABLET | Freq: Every day | ORAL | Status: DC
  Administered 2016-05-12 – 2016-05-22 (×11): 1 mg via ORAL
  Filled 2016-05-12 (×11): qty 1

## 2016-05-12 MED ORDER — LABETALOL HCL 5 MG/ML IV SOLN
20.0000 mg | Freq: Once | INTRAVENOUS | Status: AC
  Administered 2016-05-12: 20 mg via INTRAVENOUS
  Filled 2016-05-12: qty 4

## 2016-05-12 MED ORDER — ALBUTEROL SULFATE (2.5 MG/3ML) 0.083% IN NEBU: 2.5000 mg | INHALATION_SOLUTION | RESPIRATORY_TRACT | Status: DC | PRN

## 2016-05-12 MED ORDER — IPRATROPIUM-ALBUTEROL 0.5-2.5 (3) MG/3ML IN SOLN
3.0000 mL | Freq: Two times a day (BID) | RESPIRATORY_TRACT | Status: DC
  Administered 2016-05-13: 3 mL via RESPIRATORY_TRACT
  Filled 2016-05-12: qty 3

## 2016-05-12 MED ORDER — MORPHINE SULFATE (PF) 2 MG/ML IV SOLN
1.0000 mg | Freq: Three times a day (TID) | INTRAVENOUS | Status: DC | PRN
  Administered 2016-05-12: 1 mg via INTRAVENOUS
  Filled 2016-05-12: qty 1

## 2016-05-12 MED ORDER — LORAZEPAM 1 MG PO TABS
1.0000 mg | ORAL_TABLET | Freq: Four times a day (QID) | ORAL | Status: AC | PRN
  Administered 2016-05-12: 1 mg via ORAL
  Filled 2016-05-12: qty 1

## 2016-05-12 MED ORDER — ADULT MULTIVITAMIN W/MINERALS CH
1.0000 | ORAL_TABLET | Freq: Every day | ORAL | Status: DC
  Administered 2016-05-12 – 2016-05-22 (×11): 1 via ORAL
  Filled 2016-05-12 (×11): qty 1

## 2016-05-12 MED ORDER — SODIUM CHLORIDE 0.9% FLUSH
3.0000 mL | Freq: Two times a day (BID) | INTRAVENOUS | Status: DC
  Administered 2016-05-12 – 2016-05-19 (×14): 3 mL via INTRAVENOUS

## 2016-05-12 MED ORDER — LORAZEPAM 2 MG/ML IJ SOLN: 1.0000 mg | Freq: Four times a day (QID) | INTRAMUSCULAR | Status: AC | PRN

## 2016-05-12 MED ORDER — HYDRALAZINE HCL 20 MG/ML IJ SOLN: 10.0000 mg | Freq: Four times a day (QID) | INTRAMUSCULAR | Status: DC | PRN

## 2016-05-12 MED ORDER — VITAMIN B-1 100 MG PO TABS
100.0000 mg | ORAL_TABLET | Freq: Every day | ORAL | Status: DC
  Administered 2016-05-12 – 2016-05-22 (×11): 100 mg via ORAL
  Filled 2016-05-12 (×11): qty 1

## 2016-05-12 MED ORDER — DEXTROSE 5 % IV SOLN
1.5000 g | INTRAVENOUS | Status: DC
  Filled 2016-05-12: qty 1.5

## 2016-05-12 MED ORDER — HEPARIN SODIUM (PORCINE) 5000 UNIT/ML IJ SOLN
5000.0000 [IU] | Freq: Three times a day (TID) | INTRAMUSCULAR | Status: DC
  Administered 2016-05-12 – 2016-05-22 (×24): 5000 [IU] via SUBCUTANEOUS
  Filled 2016-05-12 (×16): qty 1

## 2016-05-12 NOTE — Progress Notes (Signed)
  Echocardiogram 2D Echocardiogram has been performed.  Bobbye Charleston 05/12/2016, 11:39 AM

## 2016-05-12 NOTE — ED Provider Notes (Signed)
CSN: [id number]     Arrival date & time 05/12/16  0706 History   First MD Initiated Contact with Patient 05/12/16 [phone]     Chief Complaint  Patient presents with  . Chest Pain  . Shortness of Breath  . Back Pain     (Consider location/radiation/quality/duration/timing/severity/associated sxs/prior Treatment) HPI Comments: 60 year old male history of hypertension, chronic kidney disease, medication noncompliance presents complaining of right-sided flank pain times several days. Denies any dysuria or hematuria. Pain is not colicky. Pain is characterized as sharp and worse with movement and does not radiate to his leg. Denies any recent history of back trauma. No decrease in urination noted. Does admit to increased fatigue. Daughter states that she feels that she has swollen all over. He denies any dyspnea but has had occasional chest pain and shortness of breath. He has been noncompliant with seeking medical attention.  Patient is a 60 y.o. male presenting with chest pain, shortness of breath, and back pain. The history is provided by the patient and a relative.  Chest Pain Associated symptoms: back pain and shortness of breath   Shortness of Breath Associated symptoms: chest pain   Back Pain Associated symptoms: chest pain     Past Medical History  Diagnosis Date  . Hypertension   . CKD (chronic kidney disease) stage 4, GFR 15-29 ml/min (HCC)     renal failure; due to hypertensive nephrosclerosis  . Chronic combined systolic and diastolic heart failure (HCC)     Echocardiogram 09/22/11: Moderate LVH, EF 99991111, grade 3 diastolic dysfunction, mild MR, moderate to severe LAE, mild RVE, mild to moderate TR, small to moderate pericardial effusion  . Cardiomyopathy secondary     likely related to HTN heart disease; possibly ETOH related as well  . History of alcohol abuse    History reviewed. No pertinent past surgical history. Family History  Problem Relation Age of Onset  . Emphysema  Mother   . Cirrhosis Father    Social History  Substance Use Topics  . Smoking status: Former Research scientist (life sciences)  . Smokeless tobacco: Current User  . Alcohol Use: 1.8 oz/week    3 Cans of beer per week     Comment: occ    Review of Systems  Respiratory: Positive for shortness of breath.   Cardiovascular: Positive for chest pain.  Musculoskeletal: Positive for back pain.  All other systems reviewed and are negative.     Allergies  Review of patient's allergies indicates no known allergies.  Home Medications   Prior to Admission medications   Medication Sig Start Date End Date Taking? Authorizing Provider  amLODipine (NORVASC) 10 MG tablet Take 1 tablet (10 mg total) by mouth daily. 02/14/14 02/14/15  Roselee Culver, MD  carvedilol (COREG) 25 MG tablet Take 1 tablet (25 mg total) by mouth 2 (two) times daily with a meal. 02/14/14 02/14/15  Roselee Culver, MD  furosemide (LASIX) 40 MG tablet Take 1 tablet (40 mg total) by mouth daily. 02/14/14 02/14/15  Roselee Culver, MD  hydrALAZINE (APRESOLINE) 50 MG tablet Take 1 tablet (50 mg total) by mouth 2 (two) times daily. 02/14/14 02/14/15  Roselee Culver, MD   BP 201/143 mmHg  Pulse 142  Temp(Src) 97.9 F (36.6 C) (Oral)  Resp 17  SpO2 100% Physical Exam  Constitutional: He is oriented to person, place, and time. He appears well-developed and well-nourished.  Non-toxic appearance. No distress.  HENT:  Head: Normocephalic and atraumatic.  Eyes: Conjunctivae, EOM and lids are  normal. Pupils are equal, round, and reactive to light.  Neck: Normal range of motion. Neck supple. No tracheal deviation present. No thyroid mass present.  Cardiovascular: Regular rhythm and normal heart sounds.  Tachycardia present.  Exam reveals no gallop.   No murmur heard. Pulmonary/Chest: Effort normal and breath sounds normal. No stridor. No respiratory distress. He has no decreased breath sounds. He has no wheezes. He has no rhonchi. He has no rales.    Abdominal: Soft. Normal appearance and bowel sounds are normal. He exhibits no distension. There is no tenderness. There is no rebound and no CVA tenderness.  Musculoskeletal: Normal range of motion. He exhibits no edema or tenderness.       Back:  Neurological: He is alert and oriented to person, place, and time. He has normal strength. No cranial nerve deficit or sensory deficit. GCS eye subscore is 4. GCS verbal subscore is 5. GCS motor subscore is 6.  Skin: Skin is warm and dry. No abrasion and no rash noted.  Psychiatric: He has a normal mood and affect. His speech is normal and behavior is normal.  Nursing note and vitals reviewed.   ED Course  Procedures (including critical care time) Labs Review Labs Reviewed  URINE CULTURE  CBC WITH DIFFERENTIAL/PLATELET  BASIC METABOLIC PANEL  TROPONIN I  BRAIN NATRIURETIC PEPTIDE  URINALYSIS, ROUTINE W REFLEX MICROSCOPIC (NOT AT [facility])    Imaging Review No results found. I have personally reviewed and evaluated these images and lab results as part of my medical decision-making.   EKG Interpretation   Date/Time:  Monday May 12 2016 07:29:16 EDT Ventricular Rate:  140 PR Interval:    QRS Duration: 99 QT Interval:  330 QTC Calculation: 504 R Axis:   -19 Text Interpretation:  Junctional tachycardia Anteroseptal infarct, age  indeterminate Prolonged QT interval Probable RV involvement, suggest  recording right precordial leads heart rate increased from prior Confirmed  by Jahaira Earnhart  MD, Donevan Biller ([id number]) on 05/12/2016 7:33:38 AM      MDM   Final diagnoses:  SOB (shortness of breath)    Patient given labetalol 15 mg for his hypertension tachycardia. Positive response to his blood pressure and pulse rate was decreased. Patient has evidence of renal failure and I discussed the case with the nephrologist on call, Dr. Azzie Roup who recommends that the patient be transferred to [facility] and he will arrange for the patient to be  dialyzed.. Discussed with the hospitalist here who will do the admission and transfer  CRITICAL CARE Performed by: Leota Jacobsen Total critical care time: 60 minutes Critical care time was exclusive of separately billable procedures and treating other patients. Critical care was necessary to treat or prevent imminent or life-threatening deterioration. Critical care was time spent personally by me on the following activities: development of treatment plan with patient and/or surrogate as well as nursing, discussions with consultants, evaluation of patient's response to treatment, examination of patient, obtaining history from patient or surrogate, ordering and performing treatments and interventions, ordering and review of laboratory studies, ordering and review of radiographic studies, pulse oximetry and re-evaluation of patient's condition.     Lacretia Leigh, MD 05/12/16 [phone]

## 2016-05-12 NOTE — H&P (Addendum)
History and Physical  Kenneth [name] [name] DOB: Jun 08, 1956 DOA: 05/12/2016  Referring physician: EDP PCP: [name], NP   Chief Complaint: upper back pain, renal failure, aflutter  HPI: Kenneth [name] is a 60 y.o. male    with history of hypertension, hyperlipidemia, combined congestive heart failure, ejection fraction 30-35% with grade 3 diastolic dysfunction, chronic kidney disease stage 3-4, medical noncompliance and alcohol abuse presented to [facility] ED due to c/o upper back pain for the last two weeks. He has not pain when walking or sitting up, he does has pain when laying down, he tool ibuprofen for the pain. He reported not taking any meds, he does not have pmd and has not followed up with any doctors since he was last discharged from the hospital in 2015.   ED course: He was noticed to be hypertensive and tachycardia upon arrival to the ED. cxr no acute findings, labs significant for hgb 9,7, na 133, bun 120/cr 13, ast 73, alt 79, bnp 2191, troponin 0.32, he was given labetalol, heart rate improved, but remain in aflutter, currently he denies chest pain, denies palpitation, no dizziness, no sob, no hypoxia, no edema. EDP contacted nephrology who recommend patient be admitted to hospitalist team and transfer to [facility] for possible initiating dialysis.  Review of Systems:  Detail per HPI, Review of systems are otherwise negative  Past Medical History  Diagnosis Date  . Hypertension   . CKD (chronic kidney disease) stage 4, GFR 15-29 ml/min (HCC)     renal failure; due to hypertensive nephrosclerosis  . Chronic combined systolic and diastolic heart failure (HCC)     Echocardiogram 09/22/11: Moderate LVH, EF 99991111, grade 3 diastolic dysfunction, mild MR, moderate to severe LAE, mild RVE, mild to moderate TR, small to moderate pericardial effusion  . Cardiomyopathy secondary     likely related to HTN heart disease; possibly ETOH related as well  . History of alcohol abuse     History reviewed. No pertinent past surgical history. Social History:  reports that he has quit smoking. He uses smokeless tobacco. He reports that he drinks about 1.8 oz of alcohol per week. He reports that he does not use illicit drugs. Patient lives at home & is able to participate in activities of daily living independently   No Known Allergies  Family History  Problem Relation Age of Onset  . Emphysema Mother   . Cirrhosis Father       Prior to Admission medications   Medication Sig Start Date End Date Taking? Authorizing Provider  naproxen sodium (ANAPROX) 220 MG tablet Take 440 mg by mouth 2 (two) times daily as needed (for pain).   Yes Historical Provider, MD  amLODipine (NORVASC) 10 MG tablet Take 1 tablet (10 mg total) by mouth daily. 02/14/14 02/14/15  Roselee Culver, MD  carvedilol (COREG) 25 MG tablet Take 1 tablet (25 mg total) by mouth 2 (two) times daily with a meal. 02/14/14 02/14/15  Roselee Culver, MD  furosemide (LASIX) 40 MG tablet Take 1 tablet (40 mg total) by mouth daily. 02/14/14 02/14/15  Roselee Culver, MD  hydrALAZINE (APRESOLINE) 50 MG tablet Take 1 tablet (50 mg total) by mouth 2 (two) times daily. 02/14/14 02/14/15  Roselee Culver, MD    Physical Exam: BP 146/95 mmHg  Pulse 100  Temp(Src) 97.9 F (36.6 C) (Oral)  Resp 16  Ht 5\' 10"  (1.778 m)  Wt 81.647 kg (180 lb)  BMI 25.83 kg/m2  SpO2 97%  General:  NAD Eyes: PERRL ENT: unremarkable Neck: supple, no JVD Cardiovascular: tachycardia, aflutter Respiratory: CTABL Abdomen: soft/ND/ND, positive bowel sounds Skin: no rash Musculoskeletal:  No edema, nontender to palpation to upper back Psychiatric: calm/cooperative Neurologic: no focal findings            Labs on Admission:  Basic Metabolic Panel:  Recent Labs Lab 05/12/16 0736  NA 133*  K 4.7  CL 105  CO2 13*  GLUCOSE 87  BUN 120*  CREATININE 13.03*  CALCIUM 8.0*   Liver Function Tests:  Recent Labs Lab  05/12/16 0736  AST 73*  ALT 79*  ALKPHOS 59  BILITOT 1.1  PROT 8.5*  ALBUMIN 4.0   No results for input(s): LIPASE, AMYLASE in the last 168 hours. No results for input(s): AMMONIA in the last 168 hours. CBC:  Recent Labs Lab 05/12/16 0736  WBC 5.1  NEUTROABS 3.7  HGB 9.7*  HCT 27.7*  MCV 87.4  PLT 185   Cardiac Enzymes:  Recent Labs Lab 05/12/16 0736  TROPONINI 0.32*    BNP (last 3 results)  Recent Labs  05/12/16 0736  BNP 2191.8*    ProBNP (last 3 results) No results for input(s): PROBNP in the last 8760 hours.  CBG: No results for input(s): GLUCAP in the last 168 hours.  Radiological Exams on Admission: Dg Chest 2 View  05/12/2016  CLINICAL DATA:  Cough and congestion with shortness of breath for 1 week EXAM: CHEST  2 VIEW COMPARISON:  December 08, 2013 FINDINGS: There is no edema or consolidation. There is cardiomegaly with pulmonary vascularity within normal limits. No adenopathy. There is atherosclerotic calcification in the aorta. No bone lesions are evident. IMPRESSION: Cardiomegaly. No edema or consolidation. There is aortic atherosclerosis. Electronically Signed   By: Lowella Grip III M.D.   On: 05/12/2016 07:57    EKG: Independently reviewed. flutter  Assessment/Plan Present on Admission:  . Renal failure   AKI on CKD: likely ESRD from uncontrolled HTN. ua no infection. He does report have been taking ibuprofen for his back pain recently. Bun elevated at 120, k 4.7, clinically does not look volume overloaded, uds pending, Renal US pending, nephrology consulted.  Aflutter: bp stable, patient is not symptomatic from it, start on lopressor, echo pending  Uncontrolled htn: start lopressor, prn hydralazine  Combined chf; currently does not seem to be volume overloaded, cxr unremarkable, no edema.  Alcohol use; start ciwa protocol  Upper back pain, thoracic spine x ray pending, no weakness.  lft elevation: check hepatitis panel  DVT  prophylaxis: heparin  Consultants: nephrology  Code Status: full   Family Communication:  Patient   Disposition Plan: admit to [place], med tele , renal floor  Time spent: 17mins  Delana Manganello MD, PhD [facility] Pager [phone] If 7PM-7AM, please contact night-coverage at [email or web address], password [facility]

## 2016-05-12 NOTE — Consult Note (Signed)
Hospital Consult    Reason for Consult:  In need of dialysis access Referring PhysicianMarval Regal  MRN #:  [id number]  History of Present Illness: This is a 60 y.o. male who has progressive CKD 5 and is now stage 5 due to longstanding poorly controlled hypertension as well as NSAID use.  His creatinine now is 13.    He does have combined CHF with EF of 30-35%, medical noncompliance and hx of etoh abuse.    He is on a beta blocker, CCB and hydralazine for hypertension.    Past Medical History  Diagnosis Date  . Hypertension   . CKD (chronic kidney disease) stage 4, GFR 15-29 ml/min (HCC)     renal failure; due to hypertensive nephrosclerosis  . Chronic combined systolic and diastolic heart failure (HCC)     Echocardiogram 09/22/11: Moderate LVH, EF 99991111, grade 3 diastolic dysfunction, mild MR, moderate to severe LAE, mild RVE, mild to moderate TR, small to moderate pericardial effusion  . Cardiomyopathy secondary     likely related to HTN heart disease; possibly ETOH related as well  . History of alcohol abuse     History reviewed. No pertinent past surgical history.  No Known Allergies  Prior to Admission medications   Medication Sig Start Date End Date Taking? Authorizing Provider  naproxen sodium (ANAPROX) 220 MG tablet Take 440 mg by mouth 2 (two) times daily as needed (for pain).   Yes Historical Provider, MD  amLODipine (NORVASC) 10 MG tablet Take 1 tablet (10 mg total) by mouth daily. 02/14/14 02/14/15  Roselee Culver, MD  carvedilol (COREG) 25 MG tablet Take 1 tablet (25 mg total) by mouth 2 (two) times daily with a meal. 02/14/14 02/14/15  Roselee Culver, MD  furosemide (LASIX) 40 MG tablet Take 1 tablet (40 mg total) by mouth daily. 02/14/14 02/14/15  Roselee Culver, MD  hydrALAZINE (APRESOLINE) 50 MG tablet Take 1 tablet (50 mg total) by mouth 2 (two) times daily. 02/14/14 02/14/15  Roselee Culver, MD    Social History   Social History  . Marital  Status: Married    Spouse Name: N/A  . Number of Children: N/A  . Years of Education: N/A   Occupational History  . Not on file.   Social History Main Topics  . Smoking status: Former Research scientist (life sciences)  . Smokeless tobacco: Current User  . Alcohol Use: 1.8 oz/week    3 Cans of beer per week     Comment: occ  . Drug Use: No  . Sexual Activity: Not on file   Other Topics Concern  . Not on file   Social History Narrative     Family History  Problem Relation Age of Onset  . Emphysema Mother   . Cirrhosis Father     ROS: [x]  Positive   [ ]  Negative   [ ]  All sytems reviewed and are negative  Cardiovascular: []  chest pain/pressure []  palpitations []  SOB lying flat []  DOE []  pain in legs while walking []  pain in legs at rest []  pain in legs at night []  non-healing ulcers []  hx of DVT []  swelling in legs  Pulmonary: []  productive cough []  asthma/wheezing []  home O2  Neurologic: []  weakness in []  arms []  legs []  numbness in []  arms []  legs []  hx of CVA []  mini stroke [] difficulty speaking or slurred speech []  temporary loss of vision in one eye []  dizziness  Hematologic: []  hx of cancer []  bleeding problems []   problems with blood clotting easily  Endocrine:   []  diabetes []  thyroid disease  GI []  vomiting blood []  blood in stool  GU: [x]  CKD/renal failure []  HD--[]  M/W/F or []  T/T/S []  burning with urination []  blood in urine  Psychiatric: []  anxiety []  depression  Musculoskeletal: []  arthritis []  joint pain  Integumentary: []  rashes []  ulcers  Constitutional: []  fever []  chills   Physical Examination  Filed Vitals:   05/12/16 1221 05/12/16 1346  BP: 143/110 156/92  Pulse: 73 100  Temp: 97.3 F (36.3 C)   Resp: 20 20   Body mass index is 26.83 kg/(m^2).  General:  WDWN in NAD Gait: Not observed HENT: WNL, normocephalic Pulmonary: normal non-labored breathing, without Rales, rhonchi,  wheezing Cardiac: regular Skin: without  rashes Vascular Exam/Pulses:  Right Left  Radial 2+ (normal) 2+ (normal)   Extremities: IV in left antecubital space; bandage right antecubital space Musculoskeletal: no muscle wasting or atrophy  Neurologic: A&O X 3;   moving all extremities equally. Speech is fluent/normal Psychiatric:  Normal affect  CBC    Component Value Date/Time   WBC 5.4 05/12/2016 1238   RBC 2.88* 05/12/2016 1238   HGB 8.7* 05/12/2016 1238   HCT 25.6* 05/12/2016 1238   PLT 155 05/12/2016 1238   MCV 88.9 05/12/2016 1238   MCH 30.2 05/12/2016 1238   MCHC 34.0 05/12/2016 1238   RDW 16.4* 05/12/2016 1238   LYMPHSABS 0.8 05/12/2016 0736   MONOABS 0.3 05/12/2016 0736   EOSABS 0.2 05/12/2016 0736   BASOSABS 0.0 05/12/2016 0736    BMET    Component Value Date/Time   NA 133* 05/12/2016 0736   K 4.7 05/12/2016 0736   CL 105 05/12/2016 0736   CO2 13* 05/12/2016 0736   GLUCOSE 87 05/12/2016 0736   BUN 120* 05/12/2016 0736   CREATININE 13.01* 05/12/2016 1238   CALCIUM 8.0* 05/12/2016 0736   GFRNONAA 4* 05/12/2016 1238   GFRAA 4* 05/12/2016 1238    COAGS: No results found for: INR, PROTIME   Non-Invasive Vascular Imaging:   Vein mapping is pending  Statin:  No. Beta Blocker:  Yes.   Aspirin:  No. ACEI:  No. ARB:  No. Other antiplatelets/anticoagulants:  No.    ASSESSMENT/PLAN: This is a 60 y.o. male with CKD 5 in need of HD access.  Pt is right hand dominant.    -will plan for tunneled HD catheter tomorrow.  If OR schedule allows, will plan for left arm permanent HD access.  I have spoken to the vascular lab and they will get his upper extremity vein mapping today. -npo after MN   Leontine Locket, PA-C [facility] [phone]  I have examined the patient, reviewed and agree with above.I have examined the patient, reviewed and agree with above.Explained the use of temporary catheter, AV graft and AV fistula for hemodialysis. Will plan catheter placement tomorrow and  long-term access if the schedule allows. Has poor surface veins a physical exam. Will obtain vein mapping today for further determination of best access options.  Curt Jews, MD 05/12/2016 2:41 PM   Curt Jews, MD 05/12/2016 2:41 PM

## 2016-05-12 NOTE — Progress Notes (Addendum)
Right  Upper Extremity Vein Map    Cephalic  Segment Diameter Depth Comment  1. axilla  2.8 mm    2. Mid upper arm 2.5 mm mm   3. Above AC 2.3 mm mm   4. In AC 2.6 mm mm   5. Below AC 2.6 mm mm   6. Mid forearm 2.4 mm mm   7. Wrist 2.4 mm mm    mm mm    mm mm    Basilic  Segment Diameter Depth Comment  1. Axilla 6.0 mm mm   2. Mid upper arm 5.5 mm mm   3. Above AC 2.9 mm mm   4. In AC 1.9 mm mm   5. Below AC 1.5 mm mm   6. Mid forearm 0.9 mm mm   7. Wrist 1.1 mm mm    mm mm    mm mm    mm mm

## 2016-05-12 NOTE — ED Notes (Signed)
ETOH ABUSE. Pt states does not drink daily. Last drink 3 days ago. Medication with Morphine 1 Mg IVP for scapula pain only. Continues to deny CP/SOB. CIWA 1. Pt aware of medication to help with tremors.

## 2016-05-12 NOTE — ED Notes (Signed)
Daughter states father has not taken BP meds in two years.

## 2016-05-12 NOTE — Consult Note (Signed)
Reason for Consult:  Progressive CKD now at stage 5 Referring Physician: Erlinda Hong, MD  Kenneth [name] is an 60 y.o. male.  HPI: Pt is a 60yo AAM with PMH sig for poorly controlled, malignant hypertension, cardiomyopathy (secondary to malignant Hypertensive heart disease), chronic combined systolic and diastolic heart failure and advanced CKD stage 5 due to HTN who presented to [facility] with 3 day h/o low back pain.  He has been taking alleve twice daily without relief.  Of note, he was seen by our service on 2 separate occassions, last time in February 2015 when his Scr was 5.42.  He was informed of his advanced CKD and was advised to proceed with AVF placement and follow up at that time, however he declined both and has not been seen since that hospitalization.  His work up in the ED was significant for Scr of 13, BUN 120, bicarb 13, and Hgb of 8.7, as well as renal US significant for atrophic kidneys bilaterally.  We were asked to further evaluate and manage his advanced CKD which has now progressed to stage 5 complicated by anemia and metabolic acidosis.     Trend in Creatinine: CREATININE, SER  Date/Time Value Ref Range Status  05/12/2016 07:36 AM 13.03* 0.61 - 1.24 mg/dL Final  12/10/2013 05:19 AM 5.42* 0.50 - 1.35 mg/dL Final  12/09/2013 04:00 AM 4.83* 0.50 - 1.35 mg/dL Final  12/08/2013 06:29 PM 5.10* 0.50 - 1.35 mg/dL Final  09/30/2011 06:49 AM 4.70* 0.50 - 1.35 mg/dL Final  09/29/2011 06:05 AM 5.08* 0.50 - 1.35 mg/dL Final  09/28/2011 06:40 AM 4.95* 0.50 - 1.35 mg/dL Final  09/27/2011 05:30 AM 5.08* 0.50 - 1.35 mg/dL Final  09/26/2011 05:55 AM 4.78* 0.50 - 1.35 mg/dL Final  09/25/2011 06:50 AM 4.81* 0.50 - 1.35 mg/dL Final  09/24/2011 05:25 AM 4.73* 0.50 - 1.35 mg/dL Final  09/23/2011 02:23 PM 4.76* 0.50 - 1.35 mg/dL Final  09/22/2011 04:20 AM 4.72* 0.50 - 1.35 mg/dL Final  09/21/2011 09:49 PM 4.72* 0.50 - 1.35 mg/dL Final  09/21/2011 05:06 PM 4.90* 0.50 - 1.35 mg/dL Final    PMH:   Past  Medical History  Diagnosis Date  . Hypertension   . CKD (chronic kidney disease) stage 4, GFR 15-29 ml/min (HCC)     renal failure; due to hypertensive nephrosclerosis  . Chronic combined systolic and diastolic heart failure (HCC)     Echocardiogram 09/22/11: Moderate LVH, EF 99991111, grade 3 diastolic dysfunction, mild MR, moderate to severe LAE, mild RVE, mild to moderate TR, small to moderate pericardial effusion  . Cardiomyopathy secondary     likely related to HTN heart disease; possibly ETOH related as well  . History of alcohol abuse     PSH:  History reviewed. No pertinent past surgical history.  Allergies: No Known Allergies  Medications:   Prior to Admission medications   Medication Sig Start Date End Date Taking? Authorizing Provider  naproxen sodium (ANAPROX) 220 MG tablet Take 440 mg by mouth 2 (two) times daily as needed (for pain).   Yes Historical Provider, MD  amLODipine (NORVASC) 10 MG tablet Take 1 tablet (10 mg total) by mouth daily. 02/14/14 02/14/15  Roselee Culver, MD  carvedilol (COREG) 25 MG tablet Take 1 tablet (25 mg total) by mouth 2 (two) times daily with a meal. 02/14/14 02/14/15  Roselee Culver, MD  furosemide (LASIX) 40 MG tablet Take 1 tablet (40 mg total) by mouth daily. 02/14/14 02/14/15  Roselee Culver, MD  hydrALAZINE (  APRESOLINE) 50 MG tablet Take 1 tablet (50 mg total) by mouth 2 (two) times daily. 02/14/14 02/14/15  Roselee Culver, MD    Inpatient medications: . folic acid  1 mg Oral Daily  . heparin  5,000 Units Subcutaneous Q8H  . metoprolol tartrate  25 mg Oral BID  . multivitamin with minerals  1 tablet Oral Daily  . sodium chloride flush  3 mL Intravenous Q12H  . thiamine  100 mg Oral Daily   Or  . thiamine  100 mg Intravenous Daily    Discontinued Meds:  There are no discontinued medications.  Social History:  reports that he has quit smoking. He uses smokeless tobacco. He reports that he drinks about 1.8 oz of alcohol per  week. He reports that he does not use illicit drugs.  Family History:   Family History  Problem Relation Age of Onset  . Emphysema Mother   . Cirrhosis Father     Pertinent items are noted in HPI. Weight change:   Intake/Output Summary (Last 24 hours) at 05/12/16 1216 Last data filed at 05/12/16 1154  Gross per 24 hour  Intake     60 ml  Output    120 ml  Net    -60 ml   BP 158/117 mmHg  Pulse 103  Temp(Src) 97.4 F (36.3 C) (Oral)  Resp 20  Ht 5\' 10"  (1.778 m)  Wt 81.647 kg (180 lb)  BMI 25.83 kg/m2  SpO2 97% Filed Vitals:   05/12/16 0950 05/12/16 1109 05/12/16 1135 05/12/16 1143  BP: 146/95 157/124 158/117   Pulse: 100 92 103   Temp:    97.4 F (36.3 C)  TempSrc:    Oral  Resp: 16 20    Height:      Weight:      SpO2: 97% 97%       General appearance: cooperative, no distress and slowed mentation Head: Normocephalic, without obvious abnormality, atraumatic Eyes: negative findings: lids and lashes normal, conjunctivae and sclerae normal and corneas clear Neck: no adenopathy, no carotid bruit, no JVD, supple, symmetrical, trachea midline and thyroid not enlarged, symmetric, no tenderness/mass/nodules Resp: clear to auscultation bilaterally Cardio: tachycardic  GI: soft, non-tender; bowel sounds normal; no masses,  no organomegaly Extremities: extremities normal, atraumatic, no cyanosis or edema  Labs: Basic Metabolic Panel:  Recent Labs Lab 05/12/16 0736  NA 133*  K 4.7  CL 105  CO2 13*  GLUCOSE 87  BUN 120*  CREATININE 13.03*  ALBUMIN 4.0  CALCIUM 8.0*   Liver Function Tests:  Recent Labs Lab 05/12/16 0736  AST 73*  ALT 79*  ALKPHOS 59  BILITOT 1.1  PROT 8.5*  ALBUMIN 4.0   No results for input(s): LIPASE, AMYLASE in the last 168 hours. No results for input(s): AMMONIA in the last 168 hours. CBC:  Recent Labs Lab 05/12/16 0736  WBC 5.1  NEUTROABS 3.7  HGB 9.7*  HCT 27.7*  MCV 87.4  PLT 185    PT/INR: @LABRCNTIP (inr:5) Cardiac Enzymes: ) Recent Labs Lab 05/12/16 0736  TROPONINI 0.32*   CBG: No results for input(s): GLUCAP in the last 168 hours.  Iron Studies: No results for input(s): IRON, TIBC, TRANSFERRIN, FERRITIN in the last 168 hours.  Xrays/Other Studies: Dg Chest 2 View  05/12/2016  CLINICAL DATA:  Cough and congestion with shortness of breath for 1 week EXAM: CHEST  2 VIEW COMPARISON:  December 08, 2013 FINDINGS: There is no edema or consolidation. There is cardiomegaly with pulmonary vascularity  within normal limits. No adenopathy. There is atherosclerotic calcification in the aorta. No bone lesions are evident. IMPRESSION: Cardiomegaly. No edema or consolidation. There is aortic atherosclerosis. Electronically Signed   By: Lowella Grip III M.D.   On: 05/12/2016 07:57   Dg Thoracic Spine 2 View  05/12/2016  CLINICAL DATA:  Upper back pain starting last Thursday EXAM: THORACIC SPINE 2 VIEWS COMPARISON:  12/08/2013 FINDINGS: Cardiomegaly is noted. No thoracic spine acute fracture or subluxation. Alignment and vertebral body heights are preserved. IMPRESSION: Negative. Electronically Signed   By: Lahoma Crocker M.D.   On: 05/12/2016 11:16   US Renal  05/12/2016  CLINICAL DATA:  Elevated creatinine in, chronic kidney disease stage 4 EXAM: RENAL / URINARY TRACT ULTRASOUND COMPLETE COMPARISON:  09/21/2011 FINDINGS: Right Kidney: Length: 8.4 cm. No hydronephrosis or renal calculi. Small perinephric fluid. Left Kidney: Length: 8.5 cm. No hydronephrosis or renal calculi. Small perinephric fluid. Bilateral kidney shows diffuse increase echogenicity consistent with medical renal disease. Bladder: Appears normal for degree of bladder distention. Small perisplenic ascites. IMPRESSION: 1. Bilateral mild atrophic kidneys with increased cortical echogenicity suspicious for medical renal disease. Small perinephric fluid bilaterally. No hydronephrosis or renal calculi. Unremarkable  urinary bladder. Electronically Signed   By: Lahoma Crocker M.D.   On: 05/12/2016 11:19     Assessment/Plan: 1.  Progressive CKD now stage 5 due to longstanding, poorly controlled HTN as well as NSAIDs.  I discussed the severity and chronicity of his kidney disease and discussed the need to initiate HD, however I also stressed the fact that this is an irreversible process and he will require ongoing hemodialysis indefinitely.  I also stressed the importance of compliance with dialysis and the increased risk of hospitalization and/or sudden death for non adherence of maintenance dialysis.  He has agreed to proceed; "I have to if I don't want to die".  Will have him watch educational videos on dialysis and consult VVS for placement of HD cath and AVF/AVG (which must be placed prior to discharge due to his history on noncompliance with follow up in the past).   2. Anemia of chronic disease- will check iron stores and initiate ESA,  Will also check SPEP/UPEP 3. CKD-MBD- will check ca/phos/iPTH and initiate binders/vit D as needed 4. Vascular access- as above 5. HTN, malignant- hypertensive heart disease and CKD.  Resume outpatient meds and avoid NSAIDs/cox-II I's 6. Metabolic acidosis- will plan for HD tomorrow after HD cath placement to correct metabolic derangements. 7. Abnormal lft's- ?related to etoh.  Will check hepatitis panel 8. Elevated troponin- per primary svc.    Kenneth [name] 05/12/2016, 12:16 PM

## 2016-05-12 NOTE — Progress Notes (Signed)
Left Upper Extremity Vein Map    Cephalic  Segment Diameter Depth Comment  1. Axilla 2.3 mm mm   2. Mid upper arm 2.0 mm mm   3. Above AC 2.4 mm mm   4. In AC 2.5 mm mm   5. Below AC 2.7 mm mm   6. Mid forearm 0.9 mm mm   7. Wrist 1.0 mm mm    Basilic  Segment Diameter Depth Comment  1. Axilla 4.4 mm mm   2. Mid upper arm 5.0 mm mm   3. Above AC 5.7 mm mm   4. In AC 4.1 mm mm   5. Below AC 1.8 mm mm   6. Mid forearm 1.6 mm mm

## 2016-05-12 NOTE — ED Notes (Signed)
Upon administering last 10 mg IVP Labetalol. Vital signs recorded. EDP Allen at bedside. Made aware of vital signs and total meds given. Verbal order not to give last 5mg  IVP of labetalol. Not given as ordered

## 2016-05-12 NOTE — ED Notes (Signed)
Patient c/o upper back pain that began last Thursday.  Patient states has some chest pain and SOB.  Patient noted to be hypertensive and tachy in triage.  Rates pain 5/10.

## 2016-05-12 NOTE — ED Notes (Signed)
MD at bedside. EDP PRESENT

## 2016-05-12 NOTE — Progress Notes (Signed)
CRITICAL VALUE ALERT  Critical value received:  Troponin  Date of notification: 7/1/7/1/7  Time of notification:  [id number]  Critical value read back  yes  Nurse who received alert:  Maryruth Hancock  MD notified (1st page):  Dr. Saralyn Pilar  Time of first page: 1740  MD notified (2nd page):  Time of second page:  Responding MD:  Dr. Marily Memos  Time MD responded:   [phone]

## 2016-05-12 NOTE — ED Notes (Signed)
Patient transported to X-ray 

## 2016-05-12 NOTE — ED Notes (Signed)
Labetatol 10 mg IVP given Change in EKG. Pt in [place]. EKG given to [place]. Continue with additional Labetalol 10mg  IVP. Pt without complaints. Denies CP/SOB

## 2016-05-12 NOTE — ED Notes (Signed)
Aware of need for urine sample 

## 2016-05-12 NOTE — ED Notes (Signed)
MD at bedside. 

## 2016-05-12 NOTE — ED Notes (Signed)
RENAL US AND ECHO US COMPLETED BEFORE TRANSFER RESULTS PENDING

## 2016-05-13 ENCOUNTER — Encounter (HOSPITAL_COMMUNITY)
Admission: EM | Disposition: A | Discharge status: Home / Self Care | Payer: Self-pay | Source: Home / Self Care | Attending: Internal Medicine

## 2016-05-13 DIAGNOSIS — I5043 Acute on chronic combined systolic (congestive) and diastolic (congestive) heart failure: Secondary | ICD-10-CM

## 2016-05-13 DIAGNOSIS — N185 Chronic kidney disease, stage 5: Secondary | ICD-10-CM

## 2016-05-13 DIAGNOSIS — F1010 Alcohol abuse, uncomplicated: Secondary | ICD-10-CM

## 2016-05-13 DIAGNOSIS — N184 Chronic kidney disease, stage 4 (severe): Secondary | ICD-10-CM

## 2016-05-13 DIAGNOSIS — I10 Essential (primary) hypertension: Secondary | ICD-10-CM

## 2016-05-13 DIAGNOSIS — F1020 Alcohol dependence, uncomplicated: Secondary | ICD-10-CM | POA: Diagnosis present

## 2016-05-13 HISTORY — PX: PERIPHERAL VASCULAR CATHETERIZATION: SHX172C

## 2016-05-13 LAB — URINE CULTURE: Culture: 7000 — AB

## 2016-05-13 LAB — CBC
HCT: 28.3 % — ABNORMAL LOW (ref 39.0–52.0)
HCT: 30.2 % — ABNORMAL LOW (ref 39.0–52.0)
HEMOGLOBIN: 9.5 g/dL — AB (ref 13.0–17.0)
HEMOGLOBIN: 10.1 g/dL — AB (ref 13.0–17.0)
MCH: 29.9 pg (ref 26.0–34.0)
MCH: 29.7 pg (ref 26.0–34.0)
MCHC: 33.6 g/dL (ref 30.0–36.0)
MCHC: 33.4 g/dL (ref 30.0–36.0)
MCV: 89 fL (ref 78.0–100.0)
MCV: 88.8 fL (ref 78.0–100.0)
PLATELETS: 251 10*3/uL (ref 150–400)
Platelets: 181 10*3/uL (ref 150–400)
RBC: 3.18 MIL/uL — AB (ref 4.22–5.81)
RBC: 3.4 MIL/uL — AB (ref 4.22–5.81)
RDW: 16.2 % — AB (ref 11.5–15.5)
RDW: 16.3 % — ABNORMAL HIGH (ref 11.5–15.5)
WBC: 5.3 10*3/uL (ref 4.0–10.5)
WBC: 5.5 10*3/uL (ref 4.0–10.5)

## 2016-05-13 LAB — COMPREHENSIVE METABOLIC PANEL
ALBUMIN: 3.1 g/dL — AB (ref 3.5–5.0)
ALT: 161 U/L — ABNORMAL HIGH (ref 17–63)
ANION GAP: 13 (ref 5–15)
AST: 237 U/L — ABNORMAL HIGH (ref 15–41)
Alkaline Phosphatase: 102 U/L (ref 38–126)
BILIRUBIN TOTAL: 1.1 mg/dL (ref 0.3–1.2)
BUN: 122 mg/dL — ABNORMAL HIGH (ref 6–20)
CO2: 12 mmol/L — ABNORMAL LOW (ref 22–32)
Calcium: 7.6 mg/dL — ABNORMAL LOW (ref 8.9–10.3)
Chloride: 107 mmol/L (ref 101–111)
Creatinine, Ser: 13.29 mg/dL — ABNORMAL HIGH (ref 0.61–1.24)
GFR calc Af Amer: 4 mL/min — ABNORMAL LOW (ref >60)
GFR calc non Af Amer: 3 mL/min — ABNORMAL LOW (ref >60)
GLUCOSE: 100 mg/dL — AB (ref 65–99)
POTASSIUM: 4.8 mmol/L (ref 3.5–5.1)
SODIUM: 132 mmol/L — AB (ref 135–145)
TOTAL PROTEIN: 7.4 g/dL (ref 6.5–8.1)

## 2016-05-13 LAB — KAPPA/LAMBDA LIGHT CHAINS
KAPPA FREE LGHT CHN: 493.4 mg/L — AB (ref 3.3–19.4)
Kappa, lambda light chain ratio: 1.94 — ABNORMAL HIGH (ref 0.26–1.65)
Lambda free light chains: 254.6 mg/L — ABNORMAL HIGH (ref 5.7–26.3)

## 2016-05-13 LAB — RENAL FUNCTION PANEL
ANION GAP: 15 (ref 5–15)
Albumin: 3.2 g/dL — ABNORMAL LOW (ref 3.5–5.0)
BUN: 124 mg/dL — ABNORMAL HIGH (ref 6–20)
CALCIUM: 8.1 mg/dL — AB (ref 8.9–10.3)
CHLORIDE: 106 mmol/L (ref 101–111)
CO2: 14 mmol/L — ABNORMAL LOW (ref 22–32)
CREATININE: 13.3 mg/dL — AB (ref 0.61–1.24)
GFR, EST AFRICAN AMERICAN: 4 mL/min — AB (ref >60)
GFR, EST NON AFRICAN AMERICAN: 3 mL/min — AB (ref >60)
Glucose, Bld: 105 mg/dL — ABNORMAL HIGH (ref 65–99)
Phosphorus: 9.1 mg/dL — ABNORMAL HIGH (ref 2.5–4.6)
Potassium: 5.2 mmol/L — ABNORMAL HIGH (ref 3.5–5.1)
SODIUM: 135 mmol/L (ref 135–145)

## 2016-05-13 LAB — FOLATE RBC
FOLATE, HEMOLYSATE: 418.1 ng/mL
Folate, RBC: 1442 ng/mL (ref >498)
Hematocrit: 29 % — ABNORMAL LOW (ref 37.5–51.0)

## 2016-05-13 LAB — TSH: TSH: 4.868 u[IU]/mL — ABNORMAL HIGH (ref 0.350–4.500)

## 2016-05-13 LAB — PROTIME-INR
INR: 1.51 — AB (ref 0.00–1.49)
Prothrombin Time: 18.3 seconds — ABNORMAL HIGH (ref 11.6–15.2)

## 2016-05-13 LAB — TROPONIN I: Troponin I: 0.28 ng/mL (ref <0.03)

## 2016-05-13 SURGERY — INSERTION OF DIALYSIS CATHETER
Anesthesia: Choice

## 2016-05-13 SURGERY — DIALYSIS/PERMA CATHETER INSERTION

## 2016-05-13 MED ORDER — LIDOCAINE HCL (PF) 1 % IJ SOLN
INTRAMUSCULAR | Status: AC
  Filled 2016-05-13: qty 30

## 2016-05-13 MED ORDER — ISOSORB DINITRATE-HYDRALAZINE 20-37.5 MG PO TABS
1.0000 | ORAL_TABLET | Freq: Three times a day (TID) | ORAL | Status: DC
  Administered 2016-05-13 – 2016-05-22 (×25): 1 via ORAL
  Filled 2016-05-13 (×27): qty 1

## 2016-05-13 MED ORDER — ALTEPLASE 2 MG IJ SOLR: 2.0000 mg | Freq: Once | INTRAMUSCULAR | Status: DC | PRN

## 2016-05-13 MED ORDER — LIDOCAINE HCL (PF) 1 % IJ SOLN: 5.0000 mL | INTRAMUSCULAR | Status: DC | PRN

## 2016-05-13 MED ORDER — HEPARIN (PORCINE) IN NACL 2-0.9 UNIT/ML-% IJ SOLN
INTRAMUSCULAR | Status: DC | PRN
  Administered 2016-05-13: 500 mL

## 2016-05-13 MED ORDER — HEPARIN SODIUM (PORCINE) 1000 UNIT/ML IJ SOLN
INTRAMUSCULAR | Status: AC
  Filled 2016-05-13: qty 1

## 2016-05-13 MED ORDER — MIDAZOLAM HCL 2 MG/2ML IJ SOLN
INTRAMUSCULAR | Status: DC | PRN
  Administered 2016-05-13: 1 mg via INTRAVENOUS

## 2016-05-13 MED ORDER — SODIUM CHLORIDE 0.9 % IV SOLN: 100.0000 mL | INTRAVENOUS | Status: DC | PRN

## 2016-05-13 MED ORDER — MIDAZOLAM HCL 2 MG/2ML IJ SOLN
INTRAMUSCULAR | Status: AC
  Filled 2016-05-13: qty 2

## 2016-05-13 MED ORDER — HEPARIN SODIUM (PORCINE) 1000 UNIT/ML DIALYSIS: 1000.0000 [IU] | INTRAMUSCULAR | Status: DC | PRN

## 2016-05-13 MED ORDER — CARVEDILOL 6.25 MG PO TABS
6.2500 mg | ORAL_TABLET | Freq: Two times a day (BID) | ORAL | Status: DC
  Administered 2016-05-13 – 2016-05-22 (×19): 6.25 mg via ORAL
  Filled 2016-05-13 (×12): qty 1
  Filled 2016-05-13: qty 2
  Filled 2016-05-13 (×8): qty 1

## 2016-05-13 MED ORDER — HEPARIN (PORCINE) IN NACL 2-0.9 UNIT/ML-% IJ SOLN: INTRAMUSCULAR | Status: DC | PRN

## 2016-05-13 MED ORDER — PENTAFLUOROPROP-TETRAFLUOROETH EX AERO: 1.0000 "application " | INHALATION_SPRAY | CUTANEOUS | Status: DC | PRN

## 2016-05-13 MED ORDER — LIDOCAINE HCL (PF) 1 % IJ SOLN
INTRAMUSCULAR | Status: DC | PRN
  Administered 2016-05-13: 10 mL
  Administered 2016-05-13: 14 mL

## 2016-05-13 MED ORDER — FENTANYL CITRATE (PF) 100 MCG/2ML IJ SOLN
INTRAMUSCULAR | Status: AC
  Filled 2016-05-13: qty 2

## 2016-05-13 MED ORDER — FENTANYL CITRATE (PF) 100 MCG/2ML IJ SOLN
INTRAMUSCULAR | Status: DC | PRN
  Administered 2016-05-13: 25 ug via INTRAVENOUS

## 2016-05-13 MED ORDER — HEPARIN SODIUM (PORCINE) 1000 UNIT/ML DIALYSIS: 20.0000 [IU]/kg | INTRAMUSCULAR | Status: DC | PRN

## 2016-05-13 MED ORDER — LIDOCAINE-PRILOCAINE 2.5-2.5 % EX CREA: 1.0000 "application " | TOPICAL_CREAM | CUTANEOUS | Status: DC | PRN

## 2016-05-13 MED ORDER — IPRATROPIUM-ALBUTEROL 0.5-2.5 (3) MG/3ML IN SOLN: 3.0000 mL | RESPIRATORY_TRACT | Status: DC | PRN

## 2016-05-13 MED ORDER — HEPARIN SODIUM (PORCINE) 1000 UNIT/ML IJ SOLN
INTRAMUSCULAR | Status: DC | PRN
  Administered 2016-05-13: 3400 [IU] via INTRAVENOUS

## 2016-05-13 MED ORDER — HEPARIN (PORCINE) IN NACL 2-0.9 UNIT/ML-% IJ SOLN
INTRAMUSCULAR | Status: AC
  Filled 2016-05-13: qty 500

## 2016-05-13 SURGICAL SUPPLY — 4 items
CATH PALINDROME RT-P 15FX23CM (CATHETERS) ×3 IMPLANT
COVER PRB 48X5XTLSCP FOLD TPE (BAG) ×1 IMPLANT
COVER PROBE 5X48 (BAG) ×2
TRAY PV CATH (CUSTOM PROCEDURE TRAY) ×3 IMPLANT

## 2016-05-13 NOTE — Progress Notes (Signed)
Patient ID: Kenneth [name], male   DOB: 1956/03/24, 60 y.o.   MRN: [id number] S:no new complaints O:BP 135/116 mmHg  Pulse 80  Temp(Src) 96.8 F (36 C) (Rectal)  Resp 16  Ht 5\' 10"  (1.778 m)  Wt 85.775 kg (189 lb 1.6 oz)  BMI 27.13 kg/m2  SpO2 100%  Intake/Output Summary (Last 24 hours) at 05/13/16 0832 Last data filed at 05/13/16 0616  Gross per 24 hour  Intake    540 ml  Output    120 ml  Net    420 ml   Intake/Output: I/O last 3 completed shifts: In: 540 [P.O.:480; I.V.:60] Out: 120 [Urine:120]  Intake/Output this shift:    Weight change:  Gen:WD WN AAM in NAD CVS:no rub Resp:cta [id number] Ext:no edema   Recent Labs Lab 05/12/16 0736 05/12/16 1238 05/12/16 1442 05/13/16 0159  NA 133*  --   --  132*  K 4.7  --   --  4.8  CL 105  --   --  107  CO2 13*  --   --  12*  GLUCOSE 87  --   --  100*  BUN 120*  --   --  122*  CREATININE 13.03* 13.01*  --  13.29*  ALBUMIN 4.0  --   --  3.1*  CALCIUM 8.0*  --   --  7.6*  PHOS  --   --  8.0*  --   AST 73*  --   --  237*  ALT 79*  --   --  161*   Liver Function Tests:  Recent Labs Lab 05/12/16 0736 05/13/16 0159  AST 73* 237*  ALT 79* 161*  ALKPHOS 59 102  BILITOT 1.1 1.1  PROT 8.5* 7.4  ALBUMIN 4.0 3.1*   No results for input(s): LIPASE, AMYLASE in the last 168 hours. No results for input(s): AMMONIA in the last 168 hours. CBC:  Recent Labs Lab 05/12/16 0736 05/12/16 1238 05/13/16 0159  WBC 5.1 5.4 5.3  NEUTROABS 3.7  --   --   HGB 9.7* 8.7* 9.5*  HCT 27.7* 25.6* 28.3*  MCV 87.4 88.9 89.0  PLT 185 155 181   Cardiac Enzymes:  Recent Labs Lab 05/12/16 0736 05/12/16 1629 05/12/16 2045 05/13/16 0159  TROPONINI 0.32* 0.25* 0.23* 0.28*   CBG: No results for input(s): GLUCAP in the last 168 hours.  Iron Studies:  Recent Labs  05/12/16 1442  IRON 86  TIBC 274  FERRITIN 287   Studies/Results: Dg Chest 2 View  05/12/2016  CLINICAL DATA:  Cough and congestion with shortness of breath for  1 week EXAM: CHEST  2 VIEW COMPARISON:  December 08, 2013 FINDINGS: There is no edema or consolidation. There is cardiomegaly with pulmonary vascularity within normal limits. No adenopathy. There is atherosclerotic calcification in the aorta. No bone lesions are evident. IMPRESSION: Cardiomegaly. No edema or consolidation. There is aortic atherosclerosis. Electronically Signed   By: Lowella Grip III M.D.   On: 05/12/2016 07:57   Dg Thoracic Spine 2 View  05/12/2016  CLINICAL DATA:  Upper back pain starting last Thursday EXAM: THORACIC SPINE 2 VIEWS COMPARISON:  12/08/2013 FINDINGS: Cardiomegaly is noted. No thoracic spine acute fracture or subluxation. Alignment and vertebral body heights are preserved. IMPRESSION: Negative. Electronically Signed   By: Lahoma Crocker M.D.   On: 05/12/2016 11:16   US Renal  05/12/2016  CLINICAL DATA:  Elevated creatinine in, chronic kidney disease stage 4 EXAM: RENAL / URINARY TRACT ULTRASOUND COMPLETE COMPARISON:  09/21/2011 FINDINGS: Right Kidney: Length: 8.4 cm. No hydronephrosis or renal calculi. Small perinephric fluid. Left Kidney: Length: 8.5 cm. No hydronephrosis or renal calculi. Small perinephric fluid. Bilateral kidney shows diffuse increase echogenicity consistent with medical renal disease. Bladder: Appears normal for degree of bladder distention. Small perisplenic ascites. IMPRESSION: 1. Bilateral mild atrophic kidneys with increased cortical echogenicity suspicious for medical renal disease. Small perinephric fluid bilaterally. No hydronephrosis or renal calculi. Unremarkable urinary bladder. Electronically Signed   By: Lahoma Crocker M.D.   On: 05/12/2016 11:19   . cefUROXime (ZINACEF)  IV  1.5 g Intravenous On Call to OR  . folic acid  1 mg Oral Daily  . heparin  5,000 Units Subcutaneous Q8H  . metoprolol tartrate  25 mg Oral BID  . multivitamin with minerals  1 tablet Oral Daily  . sodium chloride flush  3 mL Intravenous Q12H  . thiamine  100 mg Oral  Daily   Or  . thiamine  100 mg Intravenous Daily    BMET    Component Value Date/Time   NA 132* 05/13/2016 0159   K 4.8 05/13/2016 0159   CL 107 05/13/2016 0159   CO2 12* 05/13/2016 0159   GLUCOSE 100* 05/13/2016 0159   BUN 122* 05/13/2016 0159   CREATININE 13.29* 05/13/2016 0159   CALCIUM 7.6* 05/13/2016 0159   GFRNONAA 3* 05/13/2016 0159   GFRAA 4* 05/13/2016 0159   CBC    Component Value Date/Time   WBC 5.3 05/13/2016 0159   RBC 3.18* 05/13/2016 0159   HGB 9.5* 05/13/2016 0159   HCT 28.3* 05/13/2016 0159   PLT 181 05/13/2016 0159   MCV 89.0 05/13/2016 0159   MCH 29.9 05/13/2016 0159   MCHC 33.6 05/13/2016 0159   RDW 16.2* 05/13/2016 0159   LYMPHSABS 0.8 05/12/2016 0736   MONOABS 0.3 05/12/2016 0736   EOSABS 0.2 05/12/2016 0736   BASOSABS 0.0 05/12/2016 0736     Assessment/Plan: 1. Progressive CKD now stage 5 due to longstanding, poorly controlled HTN as well as NSAIDs. I discussed the severity and chronicity of his kidney disease and discussed the need to initiate HD, however I also stressed the fact that this is an irreversible process and he will require ongoing hemodialysis indefinitely. I also stressed the importance of compliance with dialysis and the increased risk of hospitalization and/or sudden death for non adherence of maintenance dialysis. He has agreed to proceed; "I have to if I don't want to die". Will have him watch educational videos on dialysis and consult VVS for placement of HD cath and AVF/AVG (which must be placed prior to discharge due to his history on noncompliance with follow up in the past).  1. Appreciate VVS assistance and plan for HD after HD catheter placed 2. Anemia of chronic disease- will check iron stores and initiate ESA, Will also check SPEP/UPEP 3. CKD-MBD- will check ca/phos/iPTH and initiate binders/vit D as needed 4. Vascular access- as above 5. HTN, malignant- hypertensive heart disease and CKD. Resume outpatient meds  and avoid NSAIDs/cox-II I's 6. Metabolic acidosis- will plan for HD tomorrow after HD cath placement to correct metabolic derangements. 7. Abnormal lft's- ?related to etoh. Will check hepatitis panel 8. Elevated troponin- per primary svc. 9. Disposition- will need outpatient HD to be arranged as well as AVF/AVG placement before he can be placed.  [place]

## 2016-05-13 NOTE — Progress Notes (Signed)
Hemodialysis- Pt tolerated first treatment well without issue. 1L UF. No complaints. Pt was given snack as well. Report called to primary RN

## 2016-05-13 NOTE — Progress Notes (Signed)
Patient will be made NPO after midnight in case we have availability to do his fistula tomorrow.  Dr Donnetta Hutching will discuss the procedure with the patient tomorrow.  Kenneth [name]

## 2016-05-13 NOTE — Interval H&P Note (Signed)
History and Physical Interval Note:  05/13/2016 1:52 PM  Kenneth [name]  has presented today for surgery, with the diagnosis of instage renal  The various methods of treatment have been discussed with the patient and family. After consideration of risks, benefits and other options for treatment, the patient has consented to  Procedure(s): Dialysis/Perma Catheter Insertion (N/A) as a surgical intervention .  The patient's history has been reviewed, patient examined, no change in status, stable for surgery.  I have reviewed the patient's chart and labs.  Questions were answered to the patient's satisfaction.     Annamarie Major  Discussed placing catheter.  All questions answered  WElls [name]

## 2016-05-13 NOTE — Progress Notes (Signed)
PROGRESS NOTE  Kenneth [name] [name] DOB: 1956/09/14 DOA: 05/12/2016 PCP: [name], NP   LOS: 1 day   Brief Narrative: 60 y.o. male with history of hypertension, hyperlipidemia, combined congestive heart failure, ejection fraction 30-35% with grade 3 diastolic dysfunction, chronic kidney disease stage 3-4, medical noncompliance and alcohol abuse presented to [facility] ED due to c/o upper back pain for the last two weeks. He was found to be in [facility] renal failure and transferred to [facility] for renal consult and HD initiation.   Assessment & Plan: Active Problems:   CKD (chronic kidney disease), stage IV (HCC)   Systolic and diastolic CHF, acute on chronic (HCC)   Essential hypertension   Renal failure   Alcohol abuse   AKI on CKD - likely ESRD from uncontrolled HTN - nephrology consulted, discussed with Dr. Carollee Leitz this morning - vascular consulted for access - will start HD while here  A fib / A flutter with variable conduction - patient's CHA2DS2-VASc Score for Stroke Risk is 4. Cardiology consulted, will coordinate [facility] with planned procedures - this is new  Chest pain  - patient reports intermittent chest pain over the past month especially with exertion - cardiology consulted  Acute on chronic combined systolic and diastolic CHF with new pulmonary hypertension - prior 2D echos in the system shows depressed EF 30-35% in 2015, improved to 55-60% in 2015 however repeat 2D echo yesterday shows EF 25-30% - cards to see - troponins elevated but not in a pattern c/w ACS in this ESRD patient - fluid management with HD  Alcohol abuse - elevated LFTs c/w ETOH - CIWA  Uncontrolled htn - start lopressor, prn hydralazine    DVT prophylaxis: heparin Code Status: Full Family Communication: no family bedside Disposition Plan: remain inpatient  Consultants:   Nephrology   Cardiology  Vascular surgery   Procedures:   2D echo: Impressions: - When compared to the prior  study from 12/09/2013 LVEF has decreased, previously 60-65%, now 25-30% with diffuse hypokinesis. There is also a moderate RV systolic dysfunction. Severe pulmonary hypertension is new.  Antimicrobials:  None    Subjective: Flat affect, has no complaints for me this morning  Objective: Filed Vitals:   05/12/16 2153 05/13/16 0559 05/13/16 0652 05/13/16 0854  BP:  170/131 135/116 160/119  Pulse:  44 80 130  Temp: 94.3 F (34.6 C) 91.4 F (33 C) 96.8 F (36 C)   TempSrc: Axillary Oral Rectal   Resp:  18 16 18   Height:      Weight:      SpO2:  100% 100% 100%    Intake/Output Summary (Last 24 hours) at 05/13/16 1044 Last data filed at 05/13/16 0700  Gross per 24 hour  Intake    540 ml  Output    120 ml  Net    420 ml   Filed Weights   05/12/16 0814 05/12/16 1221 05/12/16 2125  Weight: 81.647 kg (180 lb) 84.823 kg (187 lb) 85.775 kg (189 lb 1.6 oz)    Examination: Constitutional: NAD Filed Vitals:   05/12/16 2153 05/13/16 0559 05/13/16 0652 05/13/16 0854  BP:  170/131 135/116 160/119  Pulse:  44 80 130  Temp: 94.3 F (34.6 C) 91.4 F (33 C) 96.8 F (36 C)   TempSrc: Axillary Oral Rectal   Resp:  18 16 18   Height:      Weight:      SpO2:  100% 100% 100%   Eyes: PERRL ENMT: Mucous membranes are moist.  Respiratory:  bibasial crackles, no wheezing. Cardiovascular: irregular, tachycardic. Regular rate and rhythm, no murmurs / rubs / gallops. No LE edema. 2+ pedal pulses. No carotid bruits.  Abdomen: no tenderness. Bowel sounds positive.  Musculoskeletal: no clubbing / cyanosis.  Skin: no rashes, lesions, ulcers. No induration Neurologic: non focal    Data Reviewed: I have personally reviewed following labs and imaging studies  CBC:  Recent Labs Lab 05/12/16 0736 05/12/16 1238 05/13/16 0159  WBC 5.1 5.4 5.3  NEUTROABS 3.7  --   --   HGB 9.7* 8.7* 9.5*  HCT 27.7* 25.6* 28.3*  MCV 87.4 88.9 89.0  PLT 185 155 0000000   Basic Metabolic Panel:  Recent  Labs Lab 05/12/16 0736 05/12/16 1238 05/12/16 1442 05/13/16 0159  NA 133*  --   --  132*  K 4.7  --   --  4.8  CL 105  --   --  107  CO2 13*  --   --  12*  GLUCOSE 87  --   --  100*  BUN 120*  --   --  122*  CREATININE 13.03* 13.01*  --  13.29*  CALCIUM 8.0*  --   --  7.6*  PHOS  --   --  8.0*  --    GFR: Estimated Creatinine Clearance: 6 mL/min (by C-G formula based on Cr of 13.29). Liver Function Tests:  Recent Labs Lab 05/12/16 0736 05/13/16 0159  AST 73* 237*  ALT 79* 161*  ALKPHOS 59 102  BILITOT 1.1 1.1  PROT 8.5* 7.4  ALBUMIN 4.0 3.1*   No results for input(s): LIPASE, AMYLASE in the last 168 hours. No results for input(s): AMMONIA in the last 168 hours. Coagulation Profile:  Recent Labs Lab 05/13/16 0503  INR 1.51*   Cardiac Enzymes:  Recent Labs Lab 05/12/16 0736 05/12/16 1629 05/12/16 2045 05/13/16 0159  TROPONINI 0.32* 0.25* 0.23* 0.28*   BNP (last 3 results) No results for input(s): PROBNP in the last 8760 hours. HbA1C: No results for input(s): HGBA1C in the last 72 hours. CBG: No results for input(s): GLUCAP in the last 168 hours. Lipid Profile: No results for input(s): CHOL, HDL, LDLCALC, TRIG, CHOLHDL, LDLDIRECT in the last 72 hours. Thyroid Function Tests:  Recent Labs  05/13/16 0503  TSH 4.868*   Anemia Panel:  Recent Labs  05/12/16 1442  VITAMINB12 1136*  FERRITIN 287  TIBC 274  IRON 86   Urine analysis:    Component Value Date/Time   COLORURINE YELLOW 05/12/2016 0834   APPEARANCEUR CLOUDY* 05/12/2016 0834   LABSPEC 1.014 05/12/2016 0834   PHURINE 5.0 05/12/2016 0834   GLUCOSEU NEGATIVE 05/12/2016 0834   HGBUR MODERATE* 05/12/2016 0834   BILIRUBINUR NEGATIVE 05/12/2016 0834   KETONESUR NEGATIVE 05/12/2016 0834   PROTEINUR 100* 05/12/2016 0834   UROBILINOGEN 0.2 12/09/2013 0013   NITRITE NEGATIVE 05/12/2016 0834   LEUKOCYTESUR NEGATIVE 05/12/2016 0834   Sepsis Labs: Invalid input(s): PROCALCITONIN,  LACTICIDVEN  Recent Results (from the past 240 hour(s))  Urine culture     Status: Abnormal   Collection Time: 05/12/16  8:24 AM  Result Value Ref Range Status   Specimen Description URINE, CLEAN CATCH  Final   Special Requests NONE  Final   Culture (A)  Final    7,000 COLONIES/mL INSIGNIFICANT GROWTH Performed at [facility]    Report Status 05/13/2016 FINAL  Final      Radiology Studies: Dg Chest 2 View  05/12/2016  CLINICAL DATA:  Cough and congestion with  shortness of breath for 1 week EXAM: CHEST  2 VIEW COMPARISON:  December 08, 2013 FINDINGS: There is no edema or consolidation. There is cardiomegaly with pulmonary vascularity within normal limits. No adenopathy. There is atherosclerotic calcification in the aorta. No bone lesions are evident. IMPRESSION: Cardiomegaly. No edema or consolidation. There is aortic atherosclerosis. Electronically Signed   By: Lowella Grip III M.D.   On: 05/12/2016 07:57   Dg Thoracic Spine 2 View  05/12/2016  CLINICAL DATA:  Upper back pain starting last Thursday EXAM: THORACIC SPINE 2 VIEWS COMPARISON:  12/08/2013 FINDINGS: Cardiomegaly is noted. No thoracic spine acute fracture or subluxation. Alignment and vertebral body heights are preserved. IMPRESSION: Negative. Electronically Signed   By: Lahoma Crocker M.D.   On: 05/12/2016 11:16   US Renal  05/12/2016  CLINICAL DATA:  Elevated creatinine in, chronic kidney disease stage 4 EXAM: RENAL / URINARY TRACT ULTRASOUND COMPLETE COMPARISON:  09/21/2011 FINDINGS: Right Kidney: Length: 8.4 cm. No hydronephrosis or renal calculi. Small perinephric fluid. Left Kidney: Length: 8.5 cm. No hydronephrosis or renal calculi. Small perinephric fluid. Bilateral kidney shows diffuse increase echogenicity consistent with medical renal disease. Bladder: Appears normal for degree of bladder distention. Small perisplenic ascites. IMPRESSION: 1. Bilateral mild atrophic kidneys with increased cortical echogenicity  suspicious for medical renal disease. Small perinephric fluid bilaterally. No hydronephrosis or renal calculi. Unremarkable urinary bladder. Electronically Signed   By: Lahoma Crocker M.D.   On: 05/12/2016 11:19     Scheduled Meds: . cefUROXime (ZINACEF)  IV  1.5 g Intravenous On Call to OR  . folic acid  1 mg Oral Daily  . heparin  5,000 Units Subcutaneous Q8H  . metoprolol tartrate  25 mg Oral BID  . multivitamin with minerals  1 tablet Oral Daily  . sodium chloride flush  3 mL Intravenous Q12H  . thiamine  100 mg Oral Daily   Or  . thiamine  100 mg Intravenous Daily   Continuous Infusions: . sodium chloride 20 mL/hr at 05/12/16 0815    Marzetta Board, MD, PhD [facility] Pager [phone]  If 7PM-7AM, please contact night-coverage [email or web address] Password [facility] 05/13/2016, 10:44 AM

## 2016-05-13 NOTE — Op Note (Signed)
Procedure: #1:  Ultrasound-guided insertion of 23 cm Pallindrome catheter   #2:  Conscious sedation 14:13-14:33 Preoperative diagnosis: End-stage renal disease  Postoperative diagnosis: Same  Anesthesia: Local with IV sedation  Operative findings: 23 cm Pallindrome catheter right internal jugular vein  Operative details: After obtaining informed consent, the patient was taken to the operating room. The patient was placed in supine position on the cath lab  table. Conscious sedation was performed with the use of IV fentanyl and versed under continuous physician and nurse monitoring.  HR, BP, and O2 sats were continuously monitored. The patient's entire neck and chest were prepped and draped in usual sterile fashion. The patient was placed in Trendelenburg position. Ultrasound was used to identify the patient's right internal jugular vein. This had normal compressibility and respiratory variation. Local anesthesia was infiltrated over the right jugular vein.  Using ultrasound guidance, the right internal jugular vein was successfully cannulated.  A 0.035 J-tipped guidewire was threaded into the right internal jugular vein and into the superior vena cava followed in to the right atrium.   Venous color and pressure were noted in the catheter.   Next sequential 12 and 14 dilators were placed over the guidewire into the right atrium.  A 16 French dilator with a peel-away sheath was then placed over the guidewire into the right atrium.   The guidewire and dilator were removed. A 23 cm Pallindrome catheter was then placed through the peel away sheath into the right atrium.  The catheter was then tunneled subcutaneously, cut to length, and the hub attached. The catheter was noted to flush and draw easily. The catheter was inspected under fluoroscopy and found with its tip to be in the right atrium without any kinks throughout its course. The catheter was sutured to the skin with nylon sutures. The neck insertion  site was closed with Vicryl stitch. The catheter was then loaded with concentrated Heparin solution. A dry sterile dressing was applied.  The patient tolerated procedure well and there were no complications. Instrument sponge and needle counts correct in the case. The patient was taken to the recovery room in stable condition. Chest x-ray will be obtained in the recovery room.  Annamarie Major, MD [facility] Office: [phone] Pager: [phone]

## 2016-05-13 NOTE — H&P (View-Only) (Signed)
Hospital Consult    Reason for Consult:  In need of dialysis access Referring PhysicianMarval Regal  MRN #:  [id number]  History of Present Illness: This is a 60 y.o. male who has progressive CKD 5 and is now stage 5 due to longstanding poorly controlled hypertension as well as NSAID use.  His creatinine now is 13.    He does have combined CHF with EF of 30-35%, medical noncompliance and hx of etoh abuse.    He is on a beta blocker, CCB and hydralazine for hypertension.    Past Medical History  Diagnosis Date  . Hypertension   . CKD (chronic kidney disease) stage 4, GFR 15-29 ml/min (HCC)     renal failure; due to hypertensive nephrosclerosis  . Chronic combined systolic and diastolic heart failure (HCC)     Echocardiogram 09/22/11: Moderate LVH, EF 99991111, grade 3 diastolic dysfunction, mild MR, moderate to severe LAE, mild RVE, mild to moderate TR, small to moderate pericardial effusion  . Cardiomyopathy secondary     likely related to HTN heart disease; possibly ETOH related as well  . History of alcohol abuse     History reviewed. No pertinent past surgical history.  No Known Allergies  Prior to Admission medications   Medication Sig Start Date End Date Taking? Authorizing Provider  naproxen sodium (ANAPROX) 220 MG tablet Take 440 mg by mouth 2 (two) times daily as needed (for pain).   Yes Historical Provider, MD  amLODipine (NORVASC) 10 MG tablet Take 1 tablet (10 mg total) by mouth daily. 02/14/14 02/14/15  Roselee Culver, MD  carvedilol (COREG) 25 MG tablet Take 1 tablet (25 mg total) by mouth 2 (two) times daily with a meal. 02/14/14 02/14/15  Roselee Culver, MD  furosemide (LASIX) 40 MG tablet Take 1 tablet (40 mg total) by mouth daily. 02/14/14 02/14/15  Roselee Culver, MD  hydrALAZINE (APRESOLINE) 50 MG tablet Take 1 tablet (50 mg total) by mouth 2 (two) times daily. 02/14/14 02/14/15  Roselee Culver, MD    Social History   Social History  . Marital  Status: Married    Spouse Name: N/A  . Number of Children: N/A  . Years of Education: N/A   Occupational History  . Not on file.   Social History Main Topics  . Smoking status: Former Research scientist (life sciences)  . Smokeless tobacco: Current User  . Alcohol Use: 1.8 oz/week    3 Cans of beer per week     Comment: occ  . Drug Use: No  . Sexual Activity: Not on file   Other Topics Concern  . Not on file   Social History Narrative     Family History  Problem Relation Age of Onset  . Emphysema Mother   . Cirrhosis Father     ROS: [x]  Positive   [ ]  Negative   [ ]  All sytems reviewed and are negative  Cardiovascular: []  chest pain/pressure []  palpitations []  SOB lying flat []  DOE []  pain in legs while walking []  pain in legs at rest []  pain in legs at night []  non-healing ulcers []  hx of DVT []  swelling in legs  Pulmonary: []  productive cough []  asthma/wheezing []  home O2  Neurologic: []  weakness in []  arms []  legs []  numbness in []  arms []  legs []  hx of CVA []  mini stroke [] difficulty speaking or slurred speech []  temporary loss of vision in one eye []  dizziness  Hematologic: []  hx of cancer []  bleeding problems []   problems with blood clotting easily  Endocrine:   []  diabetes []  thyroid disease  GI []  vomiting blood []  blood in stool  GU: [x]  CKD/renal failure []  HD--[]  M/W/F or []  T/T/S []  burning with urination []  blood in urine  Psychiatric: []  anxiety []  depression  Musculoskeletal: []  arthritis []  joint pain  Integumentary: []  rashes []  ulcers  Constitutional: []  fever []  chills   Physical Examination  Filed Vitals:   05/12/16 1221 05/12/16 1346  BP: 143/110 156/92  Pulse: 73 100  Temp: 97.3 F (36.3 C)   Resp: 20 20   Body mass index is 26.83 kg/(m^2).  General:  WDWN in NAD Gait: Not observed HENT: WNL, normocephalic Pulmonary: normal non-labored breathing, without Rales, rhonchi,  wheezing Cardiac: regular Skin: without  rashes Vascular Exam/Pulses:  Right Left  Radial 2+ (normal) 2+ (normal)   Extremities: IV in left antecubital space; bandage right antecubital space Musculoskeletal: no muscle wasting or atrophy  Neurologic: A&O X 3;   moving all extremities equally. Speech is fluent/normal Psychiatric:  Normal affect  CBC    Component Value Date/Time   WBC 5.4 05/12/2016 1238   RBC 2.88* 05/12/2016 1238   HGB 8.7* 05/12/2016 1238   HCT 25.6* 05/12/2016 1238   PLT 155 05/12/2016 1238   MCV 88.9 05/12/2016 1238   MCH 30.2 05/12/2016 1238   MCHC 34.0 05/12/2016 1238   RDW 16.4* 05/12/2016 1238   LYMPHSABS 0.8 05/12/2016 0736   MONOABS 0.3 05/12/2016 0736   EOSABS 0.2 05/12/2016 0736   BASOSABS 0.0 05/12/2016 0736    BMET    Component Value Date/Time   NA 133* 05/12/2016 0736   K 4.7 05/12/2016 0736   CL 105 05/12/2016 0736   CO2 13* 05/12/2016 0736   GLUCOSE 87 05/12/2016 0736   BUN 120* 05/12/2016 0736   CREATININE 13.01* 05/12/2016 1238   CALCIUM 8.0* 05/12/2016 0736   GFRNONAA 4* 05/12/2016 1238   GFRAA 4* 05/12/2016 1238    COAGS: No results found for: INR, PROTIME   Non-Invasive Vascular Imaging:   Vein mapping is pending  Statin:  No. Beta Blocker:  Yes.   Aspirin:  No. ACEI:  No. ARB:  No. Other antiplatelets/anticoagulants:  No.    ASSESSMENT/PLAN: This is a 60 y.o. male with CKD 5 in need of HD access.  Pt is right hand dominant.    -will plan for tunneled HD catheter tomorrow.  If OR schedule allows, will plan for left arm permanent HD access.  I have spoken to the vascular lab and they will get his upper extremity vein mapping today. -npo after MN   Leontine Locket, PA-C [facility] [phone]  I have examined the patient, reviewed and agree with above.I have examined the patient, reviewed and agree with above.Explained the use of temporary catheter, AV graft and AV fistula for hemodialysis. Will plan catheter placement tomorrow and  long-term access if the schedule allows. Has poor surface veins a physical exam. Will obtain vein mapping today for further determination of best access options.  Curt Jews, MD 05/12/2016 2:41 PM   Curt Jews, MD 05/12/2016 2:41 PM

## 2016-05-13 NOTE — Consult Note (Signed)
Cardiology Consult    Patient ID: Kenneth [name] MRN: [id number], DOB/AGE: 01-Jul-1956   Admit date: 05/12/2016 Date of Consult: 05/13/2016  Primary Physician: [name], NP Primary Cardiologist: New Requesting Provider: Dr. Cruzita Lederer Reason for Consultation: Reduced EF, Chest pain, Elevated Trop  Patient Profile    60 yo male with PMH of uncontrolled HTN/CKD IV/chronic combined HF and ETOH abuse who presented to [facility] with reports of upper back pain, and found to be hypertensive/tachycardiac and acute renal failure. Transported to [facility] for nephrology to follow about initiating dialysis.   Past Medical History   Past Medical History  Diagnosis Date  . Hypertension   . CKD (chronic kidney disease) stage 4, GFR 15-29 ml/min (HCC)     renal failure; due to hypertensive nephrosclerosis  . Chronic combined systolic and diastolic heart failure (HCC)     Echocardiogram 09/22/11: Moderate LVH, EF 99991111, grade 3 diastolic dysfunction, mild MR, moderate to severe LAE, mild RVE, mild to moderate TR, small to moderate pericardial effusion  . Cardiomyopathy secondary     likely related to HTN heart disease; possibly ETOH related as well  . History of alcohol abuse     History reviewed. No pertinent past surgical history.   Allergies  No Known Allergies  History of Present Illness    Mr. [name] Is a 60 year old male with past medical history of uncontrolled hypertension, chronic kidney disease stage IV, combined chronic heart failure, and EtOH abuse. He reports that he was last seen and admitted in 2015 for episode of acute on chronic heart failure, with lower extremity edema. States he was told to follow-up after this discharge, but does not have a primary care M.D. or followed up with cardiology since that admission. His last EF was 55-60% with no wall motion abnormality, grade 2 diastolic dysfunction, and mildly dilated left atrium.  He presented to [facility] ED complaining of upper  back pain for the past couple weeks, along with intermittent episodes of chest pain mostly with rest while he is supine. Denied any dyspnea on exertion, dizziness, lightheadedness, palpitations, or nausea or vomiting. States he attempted to take ibuprofen at home for the back pain but received no relief. Reports he is supposed to be on multiple medications including blood pressure meds but he has not taken these medications in the past 2 years since being discharged from his last hospital admission.  In the ED at [facility] long he was noted to be hypertensive and tachycardic. His labs showed a hemoglobin of 9.7, sodium of 133 along with BUN of 120 and creatinine of 13. Liver enzymes showed AST 73 ALT 79, and BNP was elevated at 2191. Troponins have been cycled and noted to be 0.25>>0.23>>0.28. EKG showed new onset atrial flutter with rates in the 120s. Internal medicine was called for admission, and nephrology consult to do recommended the patient be transferred to [facility] for possible dialysis initiation.  Nephrology is currently following the patient, and has initiated hemodialysis. Vascular surgery saw the patient and inserted a HD catheter to be able to begin hemodialysis this afternoon. A repeat 2-D echocardiogram was done 05/12/2016 showed a reduced EF to 25-30% with diffuse hypokinesis, and moderate RV systolic dysfunction along with severe pulmonary hypertension, with PA peak pressure of 62 mmHg.   Inpatient Medications    . folic acid  1 mg Oral Daily  . heparin  5,000 Units Subcutaneous Q8H  . metoprolol tartrate  25 mg Oral BID  . multivitamin with minerals  1 tablet Oral Daily  . sodium chloride flush  3 mL Intravenous Q12H  . thiamine  100 mg Oral Daily   Or  . thiamine  100 mg Intravenous Daily    Family History    Family History  Problem Relation Age of Onset  . Emphysema Mother   . Cirrhosis Father     Social History    Social History   Social History  . Marital Status:  Married    Spouse Name: N/A  . Number of Children: N/A  . Years of Education: N/A   Occupational History  . Not on file.   Social History Main Topics  . Smoking status: Former Research scientist (life sciences)  . Smokeless tobacco: Current User  . Alcohol Use: 1.8 oz/week    3 Cans of beer per week     Comment: occ  . Drug Use: No  . Sexual Activity: Not on file   Other Topics Concern  . Not on file   Social History Narrative     Review of Systems    General:  No chills, fever, night sweats or weight changes.  Cardiovascular:  No chest pain, dyspnea on exertion, edema, orthopnea, palpitations, paroxysmal nocturnal dyspnea. Dermatological: No rash, lesions/masses Respiratory: No cough, dyspnea Urologic: No hematuria, dysuria Abdominal:   No nausea, vomiting, diarrhea, bright red blood per rectum, melena, or hematemesis Neurologic:  No visual changes, wkns, changes in mental status. All other systems reviewed and are otherwise negative except as noted above.  Physical Exam    Blood pressure 160/119, pulse 130, temperature 97.1 F (36.2 C), temperature source Rectal, resp. rate 18, height 5\' 10"  (1.778 m), weight 189 lb 1.6 oz (85.775 kg), SpO2 100 %.  General: Well nourished AA male, NAD Psych: Normal affect. Neuro: Alert and oriented X 3. Moves all extremities spontaneously. HEENT: Normal  Neck: Supple, + JVD. Lungs:  Resp regular and unlabored, CTA. Heart: RRR no s3, s4, 2/6 systolic murmur. Abdomen: Soft, non-tender, non-distended, BS + x 4.  Extremities: No clubbing, cyanosis or edema. DP/PT/Radials 2+ and equal bilaterally.  Labs    Troponin (Point of Care Test) No results for input(s): TROPIPOC in the last 72 hours.  Recent Labs  05/12/16 0736 05/12/16 1629 05/12/16 2045 05/13/16 0159  TROPONINI 0.32* 0.25* 0.23* 0.28*   Lab Results  Component Value Date   WBC 5.3 05/13/2016   HGB 9.5* 05/13/2016   HCT 28.3* 05/13/2016   MCV 89.0 05/13/2016   PLT 181 05/13/2016    Recent  Labs Lab 05/13/16 0159  NA 132*  K 4.8  CL 107  CO2 12*  BUN 122*  CREATININE 13.29*  CALCIUM 7.6*  PROT 7.4  BILITOT 1.1  ALKPHOS 102  ALT 161*  AST 237*  GLUCOSE 100*   No results found for: CHOL, HDL, LDLCALC, TRIG No results found for: [facility]   Radiology Studies    Dg Chest 2 View  05/12/2016  CLINICAL DATA:  Cough and congestion with shortness of breath for 1 week EXAM: CHEST  2 VIEW COMPARISON:  December 08, 2013 FINDINGS: There is no edema or consolidation. There is cardiomegaly with pulmonary vascularity within normal limits. No adenopathy. There is atherosclerotic calcification in the aorta. No bone lesions are evident. IMPRESSION: Cardiomegaly. No edema or consolidation. There is aortic atherosclerosis. Electronically Signed   By: Lowella Grip III M.D.   On: 05/12/2016 07:57   Dg Thoracic Spine 2 View  05/12/2016  CLINICAL DATA:  Upper back pain starting last Thursday  EXAM: THORACIC SPINE 2 VIEWS COMPARISON:  12/08/2013 FINDINGS: Cardiomegaly is noted. No thoracic spine acute fracture or subluxation. Alignment and vertebral body heights are preserved. IMPRESSION: Negative. Electronically Signed   By: Lahoma Crocker M.D.   On: 05/12/2016 11:16   US Renal  05/12/2016  CLINICAL DATA:  Elevated creatinine in, chronic kidney disease stage 4 EXAM: RENAL / URINARY TRACT ULTRASOUND COMPLETE COMPARISON:  09/21/2011 FINDINGS: Right Kidney: Length: 8.4 cm. No hydronephrosis or renal calculi. Small perinephric fluid. Left Kidney: Length: 8.5 cm. No hydronephrosis or renal calculi. Small perinephric fluid. Bilateral kidney shows diffuse increase echogenicity consistent with medical renal disease. Bladder: Appears normal for degree of bladder distention. Small perisplenic ascites. IMPRESSION: 1. Bilateral mild atrophic kidneys with increased cortical echogenicity suspicious for medical renal disease. Small perinephric fluid bilaterally. No hydronephrosis or renal calculi. Unremarkable  urinary bladder. Electronically Signed   By: Lahoma Crocker M.D.   On: 05/12/2016 11:19    ECG & Cardiac Imaging    EKG: A-Flutter  Echo: 05/12/2016  Study Conclusions  - Left ventricle: The cavity size was mildly dilated. There was  moderate concentric hypertrophy. Systolic function was severely  reduced. The estimated ejection fraction was in the range of 25%  to 30%. Diffuse hypokinesis. The study was not technically  sufficient to allow evaluation of LV diastolic dysfunction due to  atrial flutter. - Aortic root: The aortic root was normal in size. - Ascending aorta: The ascending aorta was normal in size. - Mitral valve: There was moderate regurgitation. - Left atrium: The atrium was severely dilated. - Right ventricle: The cavity size was mildly dilated. Wall  thickness was normal. Systolic function was moderately reduced. - Right atrium: The atrium was moderately dilated. - Tricuspid valve: There was moderate-severe regurgitation. - Pulmonary arteries: Systolic pressure was severely increased. PA  peak pressure: 62 mm Hg (S). - Inferior vena cava: The vessel was normal in size. - Pericardium, extracardiac: A trivial pericardial effusion was  identified posterior to the heart. Features were not consistent  with tamponade physiology.  Impressions:  - When compared to the prior study from 12/09/2013 LVEF has  decreased, previously 60-65%, now 25-30% with diffuse  hypokinesis.  There is also a moderate RV systolic dysfunction.  Severe pulmonary hypertension is new.  Assessment & Plan    Mr. Neals Is a 60 year old male with past medical history of uncontrolled hypertension, chronic kidney disease stage IV, combined chronic heart failure, and EtOH abuse. He reports that he was last seen and admitted in 2015 for episode of acute on chronic heart failure, with lower extremity edema. States he was told to follow-up after this discharge, but does not have a primary  care M.D. or followed up with cardiology since that admission. His last EF was 55-60% with no wall motion abnormality, grade 2 diastolic dysfunction, and mildly dilated left atrium.  1. Acute on Chronic combined CHF: Last Echo in 2015 showed an EF of 55-60 %, when he was last admitted for acute on chronic combined CHF. Repeat this admission shows decrease to 25-30% with diffuse hypokinesis, with PA pressure of 70mmHg.  -- Denies any SOB, or DOE prior to admission. Reports he is suppose to be on blood pressure medication at home, but has not been in the past 2 years.  -- Trop with flat trend 0.25>0.23>0.28, likely related to acute renal failure and uncontrolled hypertension. C/o atypical chest pain symptoms. Could consider for outpatient myoview after he is past this "acute" state.  --  Would change metoprolol to coreg, add hydralazine/nitrate combination.  2. A-Flutter: Appears to be a new finding this admission. Rate controlled at this time. On beta blocker.  -- This patients CHA2DS2-VASc Score and unadjusted Ischemic Stroke Rate (% per year) is equal to 2.2 % stroke rate/year from a score of 2 Above score calculated as 1 point each if present [CHF, HTN, DM, Vascular=MI/PAD/Aortic Plaque, Age if 65-74, or Male] Above score calculated as 2 points each if present [Age > 75, or Stroke/TIA/TE] -- Only option for Trainer at this time is coumadin. He reports drinking at least a 5th of liquor a day, with no plans for stopping at this time. Given this and his hx of noncompliance, would not consider him a good candidate for anticoagulation at this time.   3. CKD stage 5: Admission Cr 13, nephrology following. HD cath inserted today, undergoing dialysis.  -- Not a candidate for ACE/ARB therapy given renal failure  4. HTN: Medications as above  5. Anemia  6. Abnormal LFTs   Signed, Whitton Hellwig, NP-C Pager [phone] 05/13/2016, 1:26 PM  Patient seen and examined and history reviewed. Agree with  above findings and plan. 60 yo BM with history of HTN, noncompliance, Etoh abuse, and CHF. Prior Echo in 2012 showed low EF 35%. Repeat Echo in 2015 showed LVH with normal LV function. Not taking meds. Reports to me that he is drinking one fifth of liquor per day. No history of CVA or bleeding. Taking a lot of NSAIDs. On presentation he was in Acute renal failure.  Echo shows severe LV dysfunction with EF 25-30%. Now s/p vascular access procedure and starting HD Patient also in atrial flutter with controlled rate. No symptoms.  Impression: 1. Acute on chronic CHF with EF 25-30%. Most likely related to heavy Etoh abuse and uncontrolled HTN. Need to focus on optimizing BP control. Recommend Coreg, hydralazine, and nitrates. Not a candidate for ACEi/ARB with RF. Volume correction with dialysis. Sodium restriction. Needs Etoh abstinence.  2. Atrial flutter. Rate controlled. He is asymptomatic. [place] vasc score of 3 with moderate risk of CVA. He is not a candidate for a NOAC due to ESRD. Would consider coumadin but I am concerned with his compliance issues and high risk of bleeding with his Etoh abuse. Patient is not willing at this time to quit drinking.  3. ESRD starting HD 4. HTN  5. Anemia of chronic disease. 6. Abnormal LFTs with elevated transaminases and low albumin. Probably due to Etoh abuse 7. Etoh abuse.   I think patient will be difficult to manage with noncompliance and ongoing Etoh abuse. If he is compliant with medication, quits drinking and keeps his follow up appointments then starting coumadin would be advised. Otherwise I think it is too risky.    Sayre Mazor [name], [place] 05/13/2016 5:15 PM

## 2016-05-14 ENCOUNTER — Encounter (HOSPITAL_COMMUNITY)
Admission: EM | Disposition: A | Discharge status: Home / Self Care | Payer: Self-pay | Source: Home / Self Care | Attending: Internal Medicine

## 2016-05-14 ENCOUNTER — Inpatient Hospital Stay (HOSPITAL_COMMUNITY): Payer: Medicaid Other | Admitting: Anesthesiology

## 2016-05-14 ENCOUNTER — Encounter (HOSPITAL_COMMUNITY): Payer: Self-pay | Admitting: Surgery

## 2016-05-14 DIAGNOSIS — N185 Chronic kidney disease, stage 5: Secondary | ICD-10-CM | POA: Insufficient documentation

## 2016-05-14 HISTORY — PX: AV FISTULA PLACEMENT: SHX1204

## 2016-05-14 LAB — PROTEIN ELECTROPHORESIS, SERUM
A/G Ratio: 0.9 (ref 0.7–1.7)
ALBUMIN ELP: 3.4 g/dL (ref 2.9–4.4)
Alpha-1-Globulin: 0.2 g/dL (ref 0.0–0.4)
Alpha-2-Globulin: 0.5 g/dL (ref 0.4–1.0)
Beta Globulin: 0.8 g/dL (ref 0.7–1.3)
GAMMA GLOBULIN: 2.1 g/dL — AB (ref 0.4–1.8)
Globulin, Total: 3.6 g/dL (ref 2.2–3.9)
M-SPIKE, %: 0.4 g/dL — AB
TOTAL PROTEIN ELP: 7 g/dL (ref 6.0–8.5)

## 2016-05-14 LAB — GLUCOSE, CAPILLARY
Glucose-Capillary: 121 mg/dL — ABNORMAL HIGH (ref 65–99)
Glucose-Capillary: 112 mg/dL — ABNORMAL HIGH (ref 65–99)

## 2016-05-14 LAB — CBC
HCT: 27.3 % — ABNORMAL LOW (ref 39.0–52.0)
HEMOGLOBIN: 9.4 g/dL — AB (ref 13.0–17.0)
MCH: 30.3 pg (ref 26.0–34.0)
MCHC: 34.4 g/dL (ref 30.0–36.0)
MCV: 88.1 fL (ref 78.0–100.0)
Platelets: 237 10*3/uL (ref 150–400)
RBC: 3.1 MIL/uL — AB (ref 4.22–5.81)
RDW: 16.9 % — ABNORMAL HIGH (ref 11.5–15.5)
WBC: 6.4 10*3/uL (ref 4.0–10.5)

## 2016-05-14 LAB — BASIC METABOLIC PANEL
ANION GAP: 14 (ref 5–15)
BUN: 92 mg/dL — ABNORMAL HIGH (ref 6–20)
CHLORIDE: 104 mmol/L (ref 101–111)
CO2: 15 mmol/L — ABNORMAL LOW (ref 22–32)
Calcium: 7.4 mg/dL — ABNORMAL LOW (ref 8.9–10.3)
Creatinine, Ser: 11.04 mg/dL — ABNORMAL HIGH (ref 0.61–1.24)
GFR, EST AFRICAN AMERICAN: 5 mL/min — AB (ref >60)
GFR, EST NON AFRICAN AMERICAN: 4 mL/min — AB (ref >60)
Glucose, Bld: 92 mg/dL (ref 65–99)
POTASSIUM: 3.9 mmol/L (ref 3.5–5.1)
SODIUM: 133 mmol/L — AB (ref 135–145)

## 2016-05-14 LAB — HEPATITIS PANEL, ACUTE
HCV Ab: <0.1 s/co ratio (ref 0.0–0.9)
HEP A IGM: NEGATIVE
HEP B C IGM: NEGATIVE
Hepatitis B Surface Ag: NEGATIVE

## 2016-05-14 SURGERY — ARTERIOVENOUS (AV) FISTULA CREATION
Anesthesia: General | Site: Arm Upper | Laterality: Left

## 2016-05-14 MED ORDER — OXYCODONE HCL 5 MG PO TABS
5.0000 mg | ORAL_TABLET | Freq: Four times a day (QID) | ORAL | Status: DC | PRN
  Administered 2016-05-15 – 2016-05-21 (×2): 5 mg via ORAL
  Filled 2016-05-14: qty 1

## 2016-05-14 MED ORDER — PROPOFOL 500 MG/50ML IV EMUL
INTRAVENOUS | Status: DC | PRN
  Administered 2016-05-14: 100 ug/kg/min via INTRAVENOUS

## 2016-05-14 MED ORDER — HEPARIN SODIUM (PORCINE) 1000 UNIT/ML DIALYSIS: 20.0000 [IU]/kg | INTRAMUSCULAR | Status: DC | PRN

## 2016-05-14 MED ORDER — ETOMIDATE 2 MG/ML IV SOLN
INTRAVENOUS | Status: AC
  Filled 2016-05-14: qty 10

## 2016-05-14 MED ORDER — PHENYLEPHRINE HCL 10 MG/ML IJ SOLN
10.0000 mg | INTRAVENOUS | Status: DC | PRN
  Administered 2016-05-14: 50 ug/min via INTRAVENOUS

## 2016-05-14 MED ORDER — SODIUM CHLORIDE 0.9 % IV SOLN
INTRAVENOUS | Status: DC | PRN
  Administered 2016-05-14: 500 mL

## 2016-05-14 MED ORDER — PROPOFOL 10 MG/ML IV BOLUS
INTRAVENOUS | Status: AC
Start: 2016-05-14 — End: 2016-05-14
  Filled 2016-05-14: qty 20

## 2016-05-14 MED ORDER — EPHEDRINE SULFATE 50 MG/ML IJ SOLN
INTRAMUSCULAR | Status: DC | PRN
  Administered 2016-05-14: 10 mg via INTRAVENOUS
  Administered 2016-05-14: 5 mg via INTRAVENOUS
  Administered 2016-05-14: 10 mg via INTRAVENOUS
  Administered 2016-05-14: 75 mg via INTRAVENOUS

## 2016-05-14 MED ORDER — FENTANYL CITRATE (PF) 250 MCG/5ML IJ SOLN
INTRAMUSCULAR | Status: AC
  Filled 2016-05-14: qty 5

## 2016-05-14 MED ORDER — LIDOCAINE 2% (20 MG/ML) 5 ML SYRINGE
INTRAMUSCULAR | Status: AC
Start: 2016-05-14 — End: 2016-05-14
  Filled 2016-05-14: qty 5

## 2016-05-14 MED ORDER — ONDANSETRON HCL 4 MG/2ML IJ SOLN
INTRAMUSCULAR | Status: AC
  Filled 2016-05-14: qty 2

## 2016-05-14 MED ORDER — PHENYLEPHRINE 40 MCG/ML (10ML) SYRINGE FOR IV PUSH (FOR BLOOD PRESSURE SUPPORT)
PREFILLED_SYRINGE | INTRAVENOUS | Status: AC
  Filled 2016-05-14: qty 20

## 2016-05-14 MED ORDER — MIDAZOLAM HCL 2 MG/2ML IJ SOLN
INTRAMUSCULAR | Status: AC
  Filled 2016-05-14: qty 2

## 2016-05-14 MED ORDER — MIDAZOLAM HCL 5 MG/5ML IJ SOLN
INTRAMUSCULAR | Status: DC | PRN
  Administered 2016-05-14: 2 mg via INTRAVENOUS

## 2016-05-14 MED ORDER — 0.9 % SODIUM CHLORIDE (POUR BTL) OPTIME
TOPICAL | Status: DC | PRN
  Administered 2016-05-14: 1000 mL

## 2016-05-14 MED ORDER — LIDOCAINE-EPINEPHRINE 0.5 %-1:200000 IJ SOLN
INTRAMUSCULAR | Status: AC
  Filled 2016-05-14: qty 1

## 2016-05-14 MED ORDER — LIDOCAINE-EPINEPHRINE 0.5 %-1:200000 IJ SOLN
INTRAMUSCULAR | Status: DC | PRN
  Administered 2016-05-14: 50 mL

## 2016-05-14 MED ORDER — FENTANYL CITRATE (PF) 100 MCG/2ML IJ SOLN
INTRAMUSCULAR | Status: DC | PRN
  Administered 2016-05-14: 50 ug via INTRAVENOUS

## 2016-05-14 MED ORDER — DEXTROSE 5 % IV SOLN
INTRAVENOUS | Status: AC
  Administered 2016-05-14: 1.5 g via INTRAVENOUS
  Filled 2016-05-14: qty 1.5

## 2016-05-14 MED ORDER — PROPOFOL 10 MG/ML IV BOLUS
INTRAVENOUS | Status: DC | PRN
  Administered 2016-05-14: 20 mg via INTRAVENOUS
  Administered 2016-05-14: 30 mg via INTRAVENOUS
  Administered 2016-05-14: 100 mg via INTRAVENOUS

## 2016-05-14 MED ORDER — ROCURONIUM BROMIDE 50 MG/5ML IV SOLN
INTRAVENOUS | Status: AC
Start: 2016-05-14 — End: 2016-05-14
  Filled 2016-05-14: qty 1

## 2016-05-14 MED ORDER — PHENYLEPHRINE HCL 10 MG/ML IJ SOLN
INTRAMUSCULAR | Status: DC | PRN
  Administered 2016-05-14 (×2): 120 ug via INTRAVENOUS

## 2016-05-14 MED ORDER — LIDOCAINE HCL (CARDIAC) 20 MG/ML IV SOLN
INTRAVENOUS | Status: DC | PRN
  Administered 2016-05-14: 100 mg via INTRATRACHEAL
  Administered 2016-05-14: 80 mg via INTRATRACHEAL

## 2016-05-14 MED ORDER — LIDOCAINE-EPINEPHRINE (PF) 1 %-1:200000 IJ SOLN
INTRAMUSCULAR | Status: AC
Start: 2016-05-14 — End: 2016-05-14
  Filled 2016-05-14: qty 30

## 2016-05-14 SURGICAL SUPPLY — 37 items
ARMBAND PINK RESTRICT EXTREMIT (MISCELLANEOUS) ×3 IMPLANT
BENZOIN TINCTURE PRP APPL 2/3 (GAUZE/BANDAGES/DRESSINGS) ×3 IMPLANT
CANISTER SUCTION 2500CC (MISCELLANEOUS) ×3 IMPLANT
CANNULA VESSEL 3MM 2 BLNT TIP (CANNULA) ×3 IMPLANT
CLIP LIGATING EXTRA MED SLVR (CLIP) ×3 IMPLANT
CLIP LIGATING EXTRA SM BLUE (MISCELLANEOUS) ×3 IMPLANT
CLOSURE WOUND 1/2 X4 (GAUZE/BANDAGES/DRESSINGS) ×2
COVER PROBE W GEL 5X96 (DRAPES) ×3 IMPLANT
DECANTER SPIKE VIAL GLASS SM (MISCELLANEOUS) ×3 IMPLANT
ELECT REM PT RETURN 9FT ADLT (ELECTROSURGICAL) ×3
ELECTRODE REM PT RTRN 9FT ADLT (ELECTROSURGICAL) ×1 IMPLANT
GAUZE SPONGE 4X4 12PLY STRL (GAUZE/BANDAGES/DRESSINGS) ×3 IMPLANT
GEL ULTRASOUND 20GR AQUASONIC (MISCELLANEOUS) IMPLANT
GLOVE BIOGEL M 6.5 STRL (GLOVE) ×15 IMPLANT
GLOVE ECLIPSE 6.5 STRL STRAW (GLOVE) ×3 IMPLANT
GLOVE SS BIOGEL STRL SZ 7.5 (GLOVE) ×1 IMPLANT
GLOVE SUPERSENSE BIOGEL SZ 7.5 (GLOVE) ×2
GOWN STRL REUS W/ TWL LRG LVL3 (GOWN DISPOSABLE) ×3 IMPLANT
GOWN STRL REUS W/TWL LRG LVL3 (GOWN DISPOSABLE) ×6
KIT BASIN OR (CUSTOM PROCEDURE TRAY) ×3 IMPLANT
KIT ROOM TURNOVER OR (KITS) ×3 IMPLANT
NS IRRIG 1000ML POUR BTL (IV SOLUTION) ×3 IMPLANT
PACK CV ACCESS (CUSTOM PROCEDURE TRAY) ×3 IMPLANT
PAD ARMBOARD 7.5X6 YLW CONV (MISCELLANEOUS) ×6 IMPLANT
SPONGE GAUZE 4X4 12PLY STER LF (GAUZE/BANDAGES/DRESSINGS) ×3 IMPLANT
STRIP CLOSURE SKIN 1/2X4 (GAUZE/BANDAGES/DRESSINGS) ×4 IMPLANT
SUT PROLENE 6 0 CC (SUTURE) ×3 IMPLANT
SUT SILK 2 0 SH (SUTURE) ×3 IMPLANT
SUT SILK 3 0 (SUTURE) ×2
SUT SILK 3-0 18XBRD TIE 12 (SUTURE) ×1 IMPLANT
SUT SILK 4 0 (SUTURE) ×2
SUT SILK 4-0 18XBRD TIE 12 (SUTURE) ×1 IMPLANT
SUT VIC AB 3-0 SH 27 (SUTURE) ×8
SUT VIC AB 3-0 SH 27X BRD (SUTURE) ×4 IMPLANT
TAPE CLOTH SURG 4X10 WHT LF (GAUZE/BANDAGES/DRESSINGS) ×3 IMPLANT
UNDERPAD 30X30 INCONTINENT (UNDERPADS AND DIAPERS) ×3 IMPLANT
WATER STERILE IRR 1000ML POUR (IV SOLUTION) ×3 IMPLANT

## 2016-05-14 NOTE — Procedures (Signed)
I was present at this dialysis session. I have reviewed the session itself and made appropriate changes.   Filed Weights   05/13/16 1543 05/13/16 1817 05/14/16 0750  Weight: 87.6 kg (193 lb 2 oz) 86.6 kg (190 lb 14.7 oz) 84.6 kg (186 lb 8.2 oz)     Recent Labs Lab 05/13/16 1610 05/14/16 0552  NA 135 133*  K 5.2* 3.9  CL 106 104  CO2 14* 15*  GLUCOSE 105* 92  BUN 124* 92*  CREATININE 13.30* 11.04*  CALCIUM 8.1* 7.4*  PHOS 9.1*  --      Recent Labs Lab 05/12/16 0736  05/13/16 0159 05/13/16 1611 05/14/16 0552  WBC 5.1  < > 5.3 5.5 6.4  NEUTROABS 3.7  --   --   --   --   HGB 9.7*  < > 9.5* 10.1* 9.4*  HCT 27.7*  < > 28.3* 30.2* 27.3*  MCV 87.4  < > 89.0 88.8 88.1  PLT 185  < > 181 251 237  < > = values in this interval not displayed.  Scheduled Meds: . carvedilol  6.25 mg Oral BID WC  . folic acid  1 mg Oral Daily  . heparin  5,000 Units Subcutaneous Q8H  . isosorbide-hydrALAZINE  1 tablet Oral TID  . multivitamin with minerals  1 tablet Oral Daily  . sodium chloride flush  3 mL Intravenous Q12H  . thiamine  100 mg Oral Daily   Or  . thiamine  100 mg Intravenous Daily   Continuous Infusions: . sodium chloride 20 mL/hr at 05/12/16 0815   PRN Meds:.albuterol, hydrALAZINE, ipratropium-albuterol, LORazepam **OR** LORazepam, morphine injection   Donetta Potts,  MD 05/14/2016, 9:21 AM

## 2016-05-14 NOTE — Progress Notes (Signed)
PROGRESS NOTE  Kenneth [name] [id number] DOB: 10-29-1955 DOA: 05/12/2016   PCP: [name], NP   LOS: 2 days   Brief Narrative: 60 y.o. male with history of hypertension, hyperlipidemia, combined congestive heart failure, ejection fraction 30-35% with grade 3 diastolic dysfunction, chronic kidney disease stage 3-4, medical noncompliance and alcohol abuse presented to [facility] ED due to c/o upper back pain for the last two weeks. He was found to be in [facility] renal failure and transferred to [facility] for renal consult and HD initiation.   Assessment & Plan:  AKI on CKD, stage V - Progressive CKD now stage 5 due to longstanding, poorly controlled HTN as well as NSAIDs - pt agreeable HD, nephrology following, appreciate assistance  - VVS consulted for placement of HD cath and AVF/AVG, appreciate assistance   A fib / A flutter with variable conduction - patient's CHA2DS2-VASc Score for Stroke Risk is 4.  - Cardiology consulted  Chest pain  - patient reports intermittent chest pain over the past month especially with exertion - cardiology consulted, appreciate assistance   Acute on chronic combined systolic and diastolic CHF with new pulmonary hypertension - Last Echo in 2015 showed an EF of 55-60 %, when he was last admitted for acute on chronic combined CHF.  - Repeat this admission shows decrease to 25-30% with diffuse hypokinesis, with PA pressure of 25mmHg.  - Trop with flat trend 0.25>0.23>0.28, likely related to acute renal failure and uncontrolled hypertension.  - on coreg, isosorbide - hydralazine   Alcohol abuse - elevated LFTs c/w ETOH - CIWA  Anemia of Chronic disease, CKD - no signs of bleeding  - CBC in AM  Hypertensive urgency  - on coreg, isosorbide - hydralazine  - SBP in 120's this AM - continue same regimen for now   DVT prophylaxis: heparin Code Status: Full Family Communication: no family bedside Disposition Plan: remain inpatient, home once cleared by consultants     Consultants:   Nephrology   Cardiology  Vascular surgery   Procedures:   2D echo:-When compared to the prior study from 12/09/2013 LVEF has decreased, previously 60-65%, now 25-30% with diffuse hypokinesis. There is also a moderate RV systolic dysfunction. Severe pulmonary hypertension is new.  Antimicrobials:  None    Subjective: Flat affect, has no complaints this AM.  Objective: Filed Vitals:   05/14/16 1000 05/14/16 1030 05/14/16 1054 05/14/16 1156  BP: 124/83 136/87 129/99   Pulse: 83 84 76 93  Temp:      TempSrc:      Resp: 16 17 18    Height:      Weight:   83.6 kg (184 lb 4.9 oz)   SpO2: 100%  100%     Intake/Output Summary (Last 24 hours) at 05/14/16 1257 Last data filed at 05/14/16 1054  Gross per 24 hour  Intake    120 ml  Output   2009 ml  Net  -1889 ml   Filed Weights   05/13/16 1817 05/14/16 0750 05/14/16 1054  Weight: 86.6 kg (190 lb 14.7 oz) 84.6 kg (186 lb 8.2 oz) 83.6 kg (184 lb 4.9 oz)   Examination:  Filed Vitals:   05/14/16 1000 05/14/16 1030 05/14/16 1054 05/14/16 1156  BP: 124/83 136/87 129/99   Pulse: 83 84 76 93  Temp:      TempSrc:      Resp: 16 17 18    Height:      Weight:   83.6 kg (184 lb 4.9 oz)   SpO2: 100%  100%    Eyes: PERRL ENMT: Mucous membranes are moist.  Respiratory: bibasial crackles, no wheezing. Cardiovascular: irregular, tachycardic. Regular rate and rhythm, no murmurs / rubs / gallops. No LE edema. 2+ pedal pulses.  Abdomen: no tenderness. Bowel sounds positive.  Musculoskeletal: no clubbing / cyanosis.  Skin: no rashes, lesions, ulcers. No induration Neurologic: non focal   Data Reviewed: I have personally reviewed following labs and imaging studies  CBC:  Recent Labs Lab 05/12/16 0736 05/12/16 1238 05/12/16 1442 05/13/16 0159 05/13/16 1611 05/14/16 0552  WBC 5.1 5.4  --  5.3 5.5 6.4  NEUTROABS 3.7  --   --   --   --   --   HGB 9.7* 8.7*  --  9.5* 10.1* 9.4*  HCT 27.7* 25.6* 29.0* 28.3*  30.2* 27.3*  MCV 87.4 88.9  --  89.0 88.8 88.1  PLT 185 155  --  181 251 [id number]   Basic Metabolic Panel:  Recent Labs Lab 05/12/16 0736 05/12/16 1238 05/12/16 1442 05/13/16 0159 05/13/16 1610 05/14/16 0552  NA 133*  --   --  132* 135 133*  K 4.7  --   --  4.8 5.2* 3.9  CL 105  --   --  107 106 104  CO2 13*  --   --  12* 14* 15*  GLUCOSE 87  --   --  100* 105* 92  BUN 120*  --   --  122* 124* 92*  CREATININE 13.03* 13.01*  --  13.29* 13.30* 11.04*  CALCIUM 8.0*  --   --  7.6* 8.1* 7.4*  PHOS  --   --  8.0*  --  9.1*  --    Liver Function Tests:  Recent Labs Lab 05/12/16 0736 05/13/16 0159 05/13/16 1610  AST 73* 237*  --   ALT 79* 161*  --   ALKPHOS 59 102  --   BILITOT 1.1 1.1  --   PROT 8.5* 7.4  --   ALBUMIN 4.0 3.1* 3.2*   Coagulation Profile:  Recent Labs Lab 05/13/16 0503  INR 1.51*   Cardiac Enzymes:  Recent Labs Lab 05/12/16 0736 05/12/16 1629 05/12/16 2045 05/13/16 0159  TROPONINI 0.32* 0.25* 0.23* 0.28*   Thyroid Function Tests:  Recent Labs  05/13/16 0503  TSH 4.868*   Anemia Panel:  Recent Labs  05/12/16 1442  VITAMINB12 1136*  FERRITIN 287  TIBC 274  IRON 86   Urine analysis:    Component Value Date/Time   COLORURINE YELLOW 05/12/2016 0834   APPEARANCEUR CLOUDY* 05/12/2016 0834   LABSPEC 1.014 05/12/2016 0834   PHURINE 5.0 05/12/2016 0834   GLUCOSEU NEGATIVE 05/12/2016 0834   HGBUR MODERATE* 05/12/2016 0834   BILIRUBINUR NEGATIVE 05/12/2016 0834   KETONESUR NEGATIVE 05/12/2016 0834   PROTEINUR 100* 05/12/2016 0834   UROBILINOGEN 0.2 12/09/2013 0013   NITRITE NEGATIVE 05/12/2016 0834   LEUKOCYTESUR NEGATIVE 05/12/2016 0834   Recent Results (from the past 240 hour(s))  Urine culture     Status: Abnormal   Collection Time: 05/12/16  8:24 AM  Result Value Ref Range Status   Specimen Description URINE, CLEAN CATCH  Final   Special Requests NONE  Final   Culture (A)  Final    7,000 COLONIES/mL INSIGNIFICANT  GROWTH Performed at [facility]    Report Status 05/13/2016 FINAL  Final    Radiology Studies: No results found.  Scheduled Meds: . carvedilol  6.25 mg Oral BID WC  . cefUROXime (ZINACEF) 1.5 GM IVPB      .  folic acid  1 mg Oral Daily  . heparin  5,000 Units Subcutaneous Q8H  . isosorbide-hydrALAZINE  1 tablet Oral TID  . multivitamin with minerals  1 tablet Oral Daily  . sodium chloride flush  3 mL Intravenous Q12H  . thiamine  100 mg Oral Daily   Or  . thiamine  100 mg Intravenous Daily   Continuous Infusions: . sodium chloride 10 mL/hr at 05/14/16 1157   Faye Ramsay, MD  [facility] Pager [phone]  If 7PM-7AM, please contact night-coverage [email or web address] Password [email or web address]   If 7PM-7AM, please contact night-coverage [email or web address] Password [email or web address] 05/14/2016, 12:57 PM

## 2016-05-14 NOTE — Anesthesia Preprocedure Evaluation (Addendum)
Anesthesia Evaluation  Patient identified by MRN, date of birth, ID band Patient awake    Reviewed: Allergy & Precautions, NPO status , Patient's Chart, lab work & pertinent test results  Airway Mallampati: III  TM Distance: >3 FB Neck ROM: Full    Dental  (+) Teeth Intact, Dental Advisory Given   Pulmonary former smoker,    breath sounds clear to auscultation       Cardiovascular hypertension, +CHF  + dysrhythmias Atrial Fibrillation  Rhythm:Irregular Rate:Normal     Neuro/Psych PSYCHIATRIC DISORDERS negative neurological ROS     GI/Hepatic negative GI ROS, Neg liver ROS,   Endo/Other    Renal/GU CRFRenal disease  negative genitourinary   Musculoskeletal negative musculoskeletal ROS (+)   Abdominal   Peds negative pediatric ROS (+)  Hematology negative hematology ROS (+)   Anesthesia Other Findings   Reproductive/Obstetrics negative OB ROS                            Lab Results  Component Value Date   WBC 6.4 05/14/2016   HGB 9.4* 05/14/2016   HCT 27.3* 05/14/2016   MCV 88.1 05/14/2016   PLT 237 05/14/2016   Lab Results  Component Value Date   CREATININE 11.04* 05/14/2016   BUN 92* 05/14/2016   NA 133* 05/14/2016   K 3.9 05/14/2016   CL 104 05/14/2016   CO2 15* 05/14/2016   Lab Results  Component Value Date   INR 1.51* 05/13/2016   04/2016 EKG: atrial flutter, junctional tachycardia.  04/2016 Echo - Left ventricle: The cavity size was mildly dilated. There was moderate concentric hypertrophy. Systolic function was severely reduced. The estimated ejection fraction was in the range of 25% to 30%. Diffuse hypokinesis. The study was not technically sufficient to allow evaluation of LV diastolic dysfunction due to atrial flutter. - Aortic root: The aortic root was normal in size. - Ascending aorta: The ascending aorta was normal in size. - Mitral valve: There was moderate  regurgitation. - Left atrium: The atrium was severely dilated. - Right ventricle: The cavity size was mildly dilated. Wall thickness was normal. Systolic function was moderately reduced. - Right atrium: The atrium was moderately dilated. - Tricuspid valve: There was moderate-severe regurgitation. - Pulmonary arteries: Systolic pressure was severely increased. PA peak pressure: 62 mm Hg (S). - Inferior vena cava: The vessel was normal in size. - Pericardium, extracardiac: A trivial pericardial effusion was identified posterior to the heart. Features were not consistent with tamponade physiology.    Anesthesia Physical Anesthesia Plan  ASA: III  Anesthesia Plan: MAC   Post-op Pain Management:    Induction: Intravenous  Airway Management Planned: Simple Face Mask  Additional Equipment:   Intra-op Plan:   Post-operative Plan:   Informed Consent: I have reviewed the patients History and Physical, chart, labs and discussed the procedure including the risks, benefits and alternatives for the proposed anesthesia with the patient or authorized representative who has indicated his/her understanding and acceptance.     Plan Discussed with: CRNA  Anesthesia Plan Comments:         Anesthesia Quick Evaluation

## 2016-05-14 NOTE — Clinical Documentation Improvement (Signed)
Internal Medicine Nephrology/Renal  Can the diagnosis of HTN (BP 201/143 mmHg/175/120 mmHg / 180/130 mmHg/   Pulse 142/ ) be further specified? Thank you    HTN Urgency (BP > 180/110 and no end-organ involvement)  HTN Emergency (BP of atleast >180-120 mm Hg, often >220/140 mm Hg, with end-organ involvement)  HTN Crisis  Essential Hypertension  Other  Clinically Undetermined   Supporting Information: Acute on Chronic combined systolic/diastolic chf,  ETOH abuse CKD 5 adv to ESRD   Treatment: IV Labetalol, Hemodialysis started    Please exercise your independent, professional judgment when responding. A specific answer is not anticipated or expected.   Thank You,  [place] [phone]

## 2016-05-14 NOTE — Discharge Instructions (Signed)
° ° °  05/14/2016 [name] [id number] [birth year]  Surgeon(s): Rosetta Posner, MD  Procedure(s): LEFT ARM  BACILIC VEIN TRANSPOSITION  x Do not stick fistula for 12 weeks

## 2016-05-14 NOTE — Op Note (Signed)
    OPERATIVE REPORT  DATE OF SURGERY: 05/14/2016  PATIENT: Kenneth [name], 60 y.o. male MRN: [id number]  DOB: June 12, 1956  PRE-OPERATIVE DIAGNOSIS: End-stage renal disease  POST-OPERATIVE DIAGNOSIS:  Same  PROCEDURE: Left arm basilic vein transposition fistula  SURGEON:  Curt Jews, M.D.  PHYSICIAN ASSISTANT: Samantha Rhyne PA-C  ANESTHESIA:  Gen.  EBL: Minimal ml  Total I/O In: 500 [I.V.:500] Out: 1000 [Other:1000]  BLOOD ADMINISTERED: None  DRAINS: None  SPECIMEN: None  COUNTS CORRECT:  YES  PLAN OF CARE: PACU   PATIENT DISPOSITION:  PACU - hemodynamically stable  PROCEDURE DETAILS: Patient was taken to the operating placed supine position with the area the left arm was prepped and draped in usual sterile fashion. SonoSite ultrasound revealed nice caliber of basilic vein from the antecubital space to the axilla. The brachial artery was also marked. Using 3 separate incisions the basilic vein was exposed from below the antecubital space to the axilla. Curvature branches were ligated with 3 or 4 silk ties and divided. The artery was exposed through a separate incision. The artery was of good caliber with minimal atherosclerotic change. The vein was ligated distally and dilated. The vein was marked to prevent twisting and the tunnel. A tunnel was created from the level of the brachial artery at antecubital space to the axilla and the basilic vein was brought through the tunnel. The artery was occluded proximally and distally and was opened with 11 blade some long-standing with Potts scissors. The vein was cut to appropriate length and was spatulated and sewn end-to-side to the artery with a running 6-0 Prolene suture. Clamps removed and excellent thrill was noted. The wounds irrigated with saline. Hemostasis tablet cautery. The wounds were closed with 3-0 Vicryl in the subcutaneous and subcuticular tissue. Benzoin insertion for applied. The patient was transferred to the recovery  room in stable condition   Curt Jews, M.D. 05/14/2016 4:51 PM

## 2016-05-14 NOTE — Anesthesia Postprocedure Evaluation (Signed)
Anesthesia Post Note  Patient: Kenneth [name]  Procedure(s) Performed: Procedure(s) (LRB): LEFT ARM BASILIC VEIN TRANSPOSITION (Left)  Patient location during evaluation: PACU Anesthesia Type: General Level of consciousness: awake and alert Pain management: pain level controlled Vital Signs Assessment: post-procedure vital signs reviewed and stable Respiratory status: spontaneous breathing, nonlabored ventilation, respiratory function stable and patient connected to nasal cannula oxygen Cardiovascular status: blood pressure returned to baseline and stable Postop Assessment: no signs of nausea or vomiting Anesthetic complications: no    Last Vitals:  Filed Vitals:   05/14/16 1715 05/14/16 1740  BP:  93/49  Pulse: 74 65  Temp:    Resp: 15 12    Last Pain:  Filed Vitals:   05/14/16 1742  PainSc: Asleep                 Effie Berkshire

## 2016-05-14 NOTE — Transfer of Care (Addendum)
Immediate Anesthesia Transfer of Care Note  Patient: Kenneth [name]  Procedure(s) Performed: Procedure(s): LEFT ARM BASILIC VEIN TRANSPOSITION (Left)  Patient Location: PACU  Anesthesia Type:General  Level of Consciousness: patient cooperative and lethargic  Airway & Oxygen Therapy: Patient Spontanous Breathing and Patient connected to nasal cannula oxygen  Post-op Assessment: Report given to RN and Post -op Vital signs reviewed and stable  Post vital signs: Reviewed and stable  Last Vitals:  Filed Vitals:   05/14/16 1054 05/14/16 1156  BP: 129/99   Pulse: 76 93  Temp:    Resp: 18     Last Pain:  Filed Vitals:   05/14/16 1205  PainSc: Asleep         Complications: No apparent anesthesia complications

## 2016-05-14 NOTE — Progress Notes (Signed)
Subjective: Interval History: none.. Seen on dialysis with catheter working well  Objective: Vital signs in last 24 hours: Temp:  [97.3 F (36.3 C)-97.8 F (36.6 C)] 97.6 F (36.4 C) (07/19 0750) Pulse Rate:  [0-129] 93 (07/19 1156) Resp:  [0-28] 18 (07/19 1054) BP: (110-169)/(62-111) 129/99 mmHg (07/19 1054) SpO2:  [0 %-100 %] 100 % (07/19 1054) Weight:  [184 lb 4.9 oz (83.6 kg)-193 lb 2 oz (87.6 kg)] 184 lb 4.9 oz (83.6 kg) (07/19 1054)  Intake/Output from previous day: 07/18 0701 - 07/19 0700 In: 120 [P.O.:120] Out: 1009  Intake/Output this shift: Total I/O In: -  Out: 1000 [Other:1000]  Palpable radial pulses bilaterally  Lab Results:  Recent Labs  05/13/16 1611 05/14/16 0552  WBC 5.5 6.4  HGB 10.1* 9.4*  HCT 30.2* 27.3*  PLT 251 237   BMET  Recent Labs  05/13/16 1610 05/14/16 0552  NA 135 133*  K 5.2* 3.9  CL 106 104  CO2 14* 15*  GLUCOSE 105* 92  BUN 124* 92*  CREATININE 13.30* 11.04*  CALCIUM 8.1* 7.4*    Studies/Results: Dg Chest 2 View  05/12/2016  CLINICAL DATA:  Cough and congestion with shortness of breath for 1 week EXAM: CHEST  2 VIEW COMPARISON:  December 08, 2013 FINDINGS: There is no edema or consolidation. There is cardiomegaly with pulmonary vascularity within normal limits. No adenopathy. There is atherosclerotic calcification in the aorta. No bone lesions are evident. IMPRESSION: Cardiomegaly. No edema or consolidation. There is aortic atherosclerosis. Electronically Signed   By: Lowella Grip III M.D.   On: 05/12/2016 07:57   Dg Thoracic Spine 2 View  05/12/2016  CLINICAL DATA:  Upper back pain starting last Thursday EXAM: THORACIC SPINE 2 VIEWS COMPARISON:  12/08/2013 FINDINGS: Cardiomegaly is noted. No thoracic spine acute fracture or subluxation. Alignment and vertebral body heights are preserved. IMPRESSION: Negative. Electronically Signed   By: Lahoma Crocker M.D.   On: 05/12/2016 11:16   US Renal  05/12/2016  CLINICAL DATA:   Elevated creatinine in, chronic kidney disease stage 4 EXAM: RENAL / URINARY TRACT ULTRASOUND COMPLETE COMPARISON:  09/21/2011 FINDINGS: Right Kidney: Length: 8.4 cm. No hydronephrosis or renal calculi. Small perinephric fluid. Left Kidney: Length: 8.5 cm. No hydronephrosis or renal calculi. Small perinephric fluid. Bilateral kidney shows diffuse increase echogenicity consistent with medical renal disease. Bladder: Appears normal for degree of bladder distention. Small perisplenic ascites. IMPRESSION: 1. Bilateral mild atrophic kidneys with increased cortical echogenicity suspicious for medical renal disease. Small perinephric fluid bilaterally. No hydronephrosis or renal calculi. Unremarkable urinary bladder. Electronically Signed   By: Lahoma Crocker M.D.   On: 05/12/2016 11:19   Anti-infectives: Anti-infectives    Start     Dose/Rate Route Frequency Ordered Stop   05/14/16 1145  dextrose 5 % with cefUROXime (ZINACEF) ADS Med    Comments:  Sammuel Cooper   : cabinet override      05/14/16 1145 05/14/16 2359   05/13/16 0600  cefUROXime (ZINACEF) 1.5 g in dextrose 5 % 50 mL IVPB  Status:  Discontinued     1.5 g 100 mL/hr over 30 Minutes Intravenous On call to O.R. 05/12/16 1434 05/13/16 1312      Assessment/Plan: s/p Procedure(s): ARTERIOVENOUS (AV) FISTULA CREATION VERSUS AV GRAFT INSERTION (Left) Discussed need for permanent access. Vein map shows marginal left basilic vein and very small cephalic veins bilaterally. Recommend the evaluation the operating room to determine left arm AV graft versus basilic vein transposition fistula. Patient understands we'll  proceed with this today   LOS: 2 days   Everline Mahaffy 05/14/2016, 2:31 PM

## 2016-05-14 NOTE — Progress Notes (Signed)
05/14/2016 11:31 AM Hemodialysis Outpatient Note; I have initiated the "CLIP" process for Mr. Kenneth [name]. I also have left a message for the financial counselor to please see if we can get medicaid for him since he does not have insurance. Thank you. Gordy Savers

## 2016-05-14 NOTE — Progress Notes (Signed)
TELEMETRY: Reviewed telemetry pt in Atrial flutter with controlled rate: Filed Vitals:   05/14/16 1156 05/14/16 1706 05/14/16 1715 05/14/16 1740  BP:    93/49  Pulse: 93  74 65  Temp:  97 F (36.1 C)    TempSrc:      Resp:   15 12  Height:      Weight:      SpO2:  98% 99% 92%    Intake/Output Summary (Last 24 hours) at 05/14/16 1809 Last data filed at 05/14/16 1714  Gross per 24 hour  Intake   1320 ml  Output   2039 ml  Net   -719 ml   Filed Weights   05/13/16 1817 05/14/16 0750 05/14/16 1054  Weight: 190 lb 14.7 oz (86.6 kg) 186 lb 8.2 oz (84.6 kg) 184 lb 4.9 oz (83.6 kg)    Subjective Patient seen post op AV fistula placement. Still sedated. Does move extremities.  [name] Hold] carvedilol  6.25 mg Oral BID WC  . [MAR Hold] folic acid  1 mg Oral Daily  . [MAR Hold] heparin  5,000 Units Subcutaneous Q8H  . [MAR Hold] isosorbide-hydrALAZINE  1 tablet Oral TID  . [MAR Hold] multivitamin with minerals  1 tablet Oral Daily  . [MAR Hold] sodium chloride flush  3 mL Intravenous Q12H  . [MAR Hold] thiamine  100 mg Oral Daily   Or  . [MAR Hold] thiamine  100 mg Intravenous Daily   . sodium chloride 10 mL/hr at 05/14/16 1157    LABS: Basic Metabolic Panel:  Recent Labs  05/12/16 1442  05/13/16 1610 05/14/16 0552  NA  --   < > 135 133*  K  --   < > 5.2* 3.9  CL  --   < > 106 104  CO2  --   < > 14* 15*  GLUCOSE  --   < > 105* 92  BUN  --   < > 124* 92*  CREATININE  --   < > 13.30* 11.04*  CALCIUM  --   < > 8.1* 7.4*  PHOS 8.0*  --  9.1*  --   < > = values in this interval not displayed. Liver Function Tests:  Recent Labs  05/12/16 0736 05/13/16 0159 05/13/16 1610  AST 73* 237*  --   ALT 79* 161*  --   ALKPHOS 59 102  --   BILITOT 1.1 1.1  --   PROT 8.5* 7.4  --   ALBUMIN 4.0 3.1* 3.2*   No results for input(s): LIPASE, AMYLASE in the last 72 hours. CBC:  Recent Labs  05/12/16 0736  05/13/16 1611 05/14/16 0552  WBC 5.1  < > 5.5 6.4  NEUTROABS  3.7  --   --   --   HGB 9.7*  < > 10.1* 9.4*  HCT 27.7*  < > 30.2* 27.3*  MCV 87.4  < > 88.8 88.1  PLT 185  < > 251 237  < > = values in this interval not displayed. Cardiac Enzymes:  Recent Labs  05/12/16 1629 05/12/16 2045 05/13/16 0159  TROPONINI 0.25* 0.23* 0.28*   BNP: No results for input(s): PROBNP in the last 72 hours. D-Dimer: No results for input(s): DDIMER in the last 72 hours. Hemoglobin A1C: No results for input(s): HGBA1C in the last 72 hours. Fasting Lipid Panel: No results for input(s): CHOL, HDL, LDLCALC, TRIG, CHOLHDL, LDLDIRECT in the last 72 hours. Thyroid Function Tests:  Recent Labs  05/13/16 0503  TSH 4.868*     Radiology/Studies:  No results found.  PHYSICAL EXAM General: Well nourished AA male, NAD Psych: very sedated Neuro: Alert and oriented X 1 Moves all extremities spontaneously. HEENT: Normal Neck: Supple, + JVD. Lungs: Resp regular and unlabored, CTA. Heart: RRR no s3, s4, 2/6 systolic murmur. Abdomen: Soft, non-tender, non-distended, BS + x 4.  Extremities: No clubbing, cyanosis or edema. DP/PT/Radials 2+ and equal bilaterally.   ASSESSMENT AND PLAN: 1. Acute on Chronic combined CHF: Last Echo in 2015 showed an EF of 55-60 %, when he was last admitted for acute on chronic combined CHF. Repeat this admission shows decrease to 25-30% with diffuse hypokinesis, with PA pressure of 41mmHg.   -- Trop with flat trend 0.25>0.23>0.28, likely related to acute renal failure and uncontrolled hypertension. C/o atypical chest pain symptoms. Could consider for outpatient myoview after he is past this "acute" state.  -- continue coreg, hydralazine/nitrate combination.  2. A-Flutter: Appears to be a new finding this admission. Rate controlled at this time. On beta blocker.  -- This patients CHA2DS2-VASc Score and unadjusted Ischemic Stroke Rate (% per year) is equal to 2.2 % stroke rate/year from a score of 2 Above score calculated  as 1 point each if present [CHF, HTN, DM, Vascular=MI/PAD/Aortic Plaque, Age if 65-74, or Male] Above score calculated as 2 points each if present [Age > 75, or Stroke/TIA/TE] -- He reports drinking at least a 5th of liquor a day, with no plans for stopping at this time. Given this and his hx of noncompliance, would not consider him a good candidate for anticoagulation at this time. If he demonstrates good follow up and Etoh abstinence we could reconsider.  3. CKD stage 5: Admission Cr 13, nephrology following. HD cath inserted today, undergoing dialysis.  -- Not a candidate for ACE/ARB therapy given renal failure  4. HTN: Medications as above  5. Anemia  6. Abnormal LFTs  7. S/p AV fistula.  Present on Admission:  . Renal failure . CKD (chronic kidney disease), stage IV ([place]) . Essential hypertension . Systolic and diastolic CHF, acute on chronic ([place]) . Alcohol abuse  Signed, [name], [place] 05/14/2016 6:09 PM

## 2016-05-14 NOTE — Progress Notes (Signed)
Patient ID: Kenneth [name], male   DOB: 1956/06/19, 60 y.o.   MRN: [id number] S:Pt seen on HD and is doing well.  C/o some lightheadedness. O:BP 110/89 mmHg  Pulse 72  Temp(Src) 97.6 F (36.4 C) (Oral)  Resp 22  Ht 5\' 10"  (1.778 m)  Wt 84.6 kg (186 lb 8.2 oz)  BMI 26.76 kg/m2  SpO2 100%  Intake/Output Summary (Last 24 hours) at 05/14/16 0919 Last data filed at 05/13/16 1849  Gross per 24 hour  Intake    120 ml  Output   1009 ml  Net   -889 ml   Intake/Output: I/O last 3 completed shifts: In: 120 [P.O.:120] Out: 1009 [Other:1009]  Intake/Output this shift:    Weight change: 5.953 kg (13 lb 2 oz) Gen:WD WN AAM  CVS:no rub Resp:cta [id number] Ext:no edema   Recent Labs Lab 05/12/16 0736 05/12/16 1238 05/12/16 1442 05/13/16 0159 05/13/16 1610 05/14/16 0552  NA 133*  --   --  132* 135 133*  K 4.7  --   --  4.8 5.2* 3.9  CL 105  --   --  107 106 104  CO2 13*  --   --  12* 14* 15*  GLUCOSE 87  --   --  100* 105* 92  BUN 120*  --   --  122* 124* 92*  CREATININE 13.03* 13.01*  --  13.29* 13.30* 11.04*  ALBUMIN 4.0  --   --  3.1* 3.2*  --   CALCIUM 8.0*  --   --  7.6* 8.1* 7.4*  PHOS  --   --  8.0*  --  9.1*  --   AST 73*  --   --  237*  --   --   ALT 79*  --   --  161*  --   --    Liver Function Tests:  Recent Labs Lab 05/12/16 0736 05/13/16 0159 05/13/16 1610  AST 73* 237*  --   ALT 79* 161*  --   ALKPHOS 59 102  --   BILITOT 1.1 1.1  --   PROT 8.5* 7.4  --   ALBUMIN 4.0 3.1* 3.2*   No results for input(s): LIPASE, AMYLASE in the last 168 hours. No results for input(s): AMMONIA in the last 168 hours. CBC:  Recent Labs Lab 05/12/16 0736 05/12/16 1238  05/13/16 0159 05/13/16 1611 05/14/16 0552  WBC 5.1 5.4  --  5.3 5.5 6.4  NEUTROABS 3.7  --   --   --   --   --   HGB 9.7* 8.7*  --  9.5* 10.1* 9.4*  HCT 27.7* 25.6*  < > 28.3* 30.2* 27.3*  MCV 87.4 88.9  --  89.0 88.8 88.1  PLT 185 155  --  181 251 237  < > = values in this interval not  displayed. Cardiac Enzymes:  Recent Labs Lab 05/12/16 0736 05/12/16 1629 05/12/16 2045 05/13/16 0159  TROPONINI 0.32* 0.25* 0.23* 0.28*   CBG: No results for input(s): GLUCAP in the last 168 hours.  Iron Studies:  Recent Labs  05/12/16 1442  IRON 86  TIBC 274  FERRITIN 287   Studies/Results: Dg Thoracic Spine 2 View  05/12/2016  CLINICAL DATA:  Upper back pain starting last Thursday EXAM: THORACIC SPINE 2 VIEWS COMPARISON:  12/08/2013 FINDINGS: Cardiomegaly is noted. No thoracic spine acute fracture or subluxation. Alignment and vertebral body heights are preserved. IMPRESSION: Negative. Electronically Signed   By: Lahoma Crocker M.D.   On: 05/12/2016  11:16   US Renal  05/12/2016  CLINICAL DATA:  Elevated creatinine in, chronic kidney disease stage 4 EXAM: RENAL / URINARY TRACT ULTRASOUND COMPLETE COMPARISON:  09/21/2011 FINDINGS: Right Kidney: Length: 8.4 cm. No hydronephrosis or renal calculi. Small perinephric fluid. Left Kidney: Length: 8.5 cm. No hydronephrosis or renal calculi. Small perinephric fluid. Bilateral kidney shows diffuse increase echogenicity consistent with medical renal disease. Bladder: Appears normal for degree of bladder distention. Small perisplenic ascites. IMPRESSION: 1. Bilateral mild atrophic kidneys with increased cortical echogenicity suspicious for medical renal disease. Small perinephric fluid bilaterally. No hydronephrosis or renal calculi. Unremarkable urinary bladder. Electronically Signed   By: Lahoma Crocker M.D.   On: 05/12/2016 11:19   . carvedilol  6.25 mg Oral BID WC  . folic acid  1 mg Oral Daily  . heparin  5,000 Units Subcutaneous Q8H  . isosorbide-hydrALAZINE  1 tablet Oral TID  . multivitamin with minerals  1 tablet Oral Daily  . sodium chloride flush  3 mL Intravenous Q12H  . thiamine  100 mg Oral Daily   Or  . thiamine  100 mg Intravenous Daily    BMET    Component Value Date/Time   NA 133* 05/14/2016 0552   K 3.9 05/14/2016 0552    CL 104 05/14/2016 0552   CO2 15* 05/14/2016 0552   GLUCOSE 92 05/14/2016 0552   BUN 92* 05/14/2016 0552   CREATININE 11.04* 05/14/2016 0552   CALCIUM 7.4* 05/14/2016 0552   GFRNONAA 4* 05/14/2016 0552   GFRAA 5* 05/14/2016 0552   CBC    Component Value Date/Time   WBC 6.4 05/14/2016 0552   RBC 3.10* 05/14/2016 0552   HGB 9.4* 05/14/2016 0552   HCT 27.3* 05/14/2016 0552   HCT 29.0* 05/12/2016 1442   PLT 237 05/14/2016 0552   MCV 88.1 05/14/2016 0552   MCH 30.3 05/14/2016 0552   MCHC 34.4 05/14/2016 0552   RDW 16.9* 05/14/2016 0552   LYMPHSABS 0.8 05/12/2016 0736   MONOABS 0.3 05/12/2016 0736   EOSABS 0.2 05/12/2016 0736   BASOSABS 0.0 05/12/2016 0736    Assessment/Plan: 1. Progressive CKD now stage 5 due to longstanding, poorly controlled HTN as well as NSAIDs. I discussed the severity and chronicity of his kidney disease and discussed the need to initiate HD, however I also stressed the fact that this is an irreversible process and he will require ongoing hemodialysis indefinitely. I also stressed the importance of compliance with dialysis and the increased risk of hospitalization and/or sudden death for non adherence of maintenance dialysis. He has agreed to proceed; "I have to if I don't want to die". Will have him watch educational videos on dialysis and consult VVS for placement of HD cath and AVF/AVG (which must be placed prior to discharge due to his history on noncompliance with follow up in the past).  1. Appreciate VVS assistance and plan for HD after HD catheter placed and for AVF/AVG today 2. CLIP process started. 2. Anemia of chronic disease- will check iron stores and initiate ESA, Will also check SPEP/UPEP 3. CKD-MBD- will check ca/phos/iPTH and initiate binders/vit D as needed 4. Vascular access- as above 5. HTN, malignant- hypertensive heart disease and CKD. Resume outpatient meds and avoid NSAIDs/cox-II I's 6. Metabolic acidosis- will plan for HD tomorrow  after HD cath placement to correct metabolic derangements. 7. Abnormal lft's- ?related to etoh. Will check hepatitis panel 8. Elevated troponin- per primary svc. 9. Disposition- will need outpatient HD to be arranged as well  as AVF/AVG placement before he can be placed.  [place]

## 2016-05-15 ENCOUNTER — Inpatient Hospital Stay (HOSPITAL_COMMUNITY): Payer: Medicaid Other

## 2016-05-15 ENCOUNTER — Encounter (HOSPITAL_COMMUNITY): Payer: Self-pay | Admitting: Vascular Surgery

## 2016-05-15 ENCOUNTER — Telehealth: Payer: Self-pay | Admitting: Vascular Surgery

## 2016-05-15 LAB — RENAL FUNCTION PANEL
ALBUMIN: 2.7 g/dL — AB (ref 3.5–5.0)
Anion gap: 10 (ref 5–15)
BUN: 57 mg/dL — AB (ref 6–20)
CALCIUM: 7.2 mg/dL — AB (ref 8.9–10.3)
CO2: 20 mmol/L — ABNORMAL LOW (ref 22–32)
CREATININE: 8.83 mg/dL — AB (ref 0.61–1.24)
Chloride: 104 mmol/L (ref 101–111)
GFR, EST AFRICAN AMERICAN: 7 mL/min — AB (ref >60)
GFR, EST NON AFRICAN AMERICAN: 6 mL/min — AB (ref >60)
Glucose, Bld: 101 mg/dL — ABNORMAL HIGH (ref 65–99)
PHOSPHORUS: 4.3 mg/dL (ref 2.5–4.6)
Potassium: 3.8 mmol/L (ref 3.5–5.1)
Sodium: 134 mmol/L — ABNORMAL LOW (ref 135–145)

## 2016-05-15 LAB — BLOOD GAS, ARTERIAL
ACID-BASE DEFICIT: 1 mmol/L (ref 0.0–2.0)
BICARBONATE: 22.5 meq/L (ref 20.0–24.0)
Drawn by: 105521
FIO2: 0.21
O2 SAT: 95 %
PCO2 ART: 33.6 mmHg — AB (ref 35.0–45.0)
PO2 ART: 71.2 mmHg — AB (ref 80.0–100.0)
Patient temperature: 98.6
TCO2: 23.6 mmol/L (ref 0–100)
pH, Arterial: 7.442 (ref 7.350–7.450)

## 2016-05-15 LAB — CBC
HCT: 24.1 % — ABNORMAL LOW (ref 39.0–52.0)
Hemoglobin: 8.1 g/dL — ABNORMAL LOW (ref 13.0–17.0)
MCH: 30.3 pg (ref 26.0–34.0)
MCHC: 33.6 g/dL (ref 30.0–36.0)
MCV: 90.3 fL (ref 78.0–100.0)
PLATELETS: 207 10*3/uL (ref 150–400)
RBC: 2.67 MIL/uL — ABNORMAL LOW (ref 4.22–5.81)
RDW: 17.5 % — AB (ref 11.5–15.5)
WBC: 6.6 10*3/uL (ref 4.0–10.5)

## 2016-05-15 LAB — GLUCOSE, CAPILLARY: Glucose-Capillary: 134 mg/dL — ABNORMAL HIGH (ref 65–99)

## 2016-05-15 LAB — HEPATITIS B SURFACE ANTIGEN: Hepatitis B Surface Ag: NEGATIVE

## 2016-05-15 LAB — HEPATITIS B SURFACE ANTIBODY,QUALITATIVE: HEP B S AB: NONREACTIVE

## 2016-05-15 LAB — TROPONIN I
TROPONIN I: 1.25 ng/mL — AB (ref <0.03)
Troponin I: 1.3 ng/mL (ref <0.03)

## 2016-05-15 LAB — HEPATITIS B CORE ANTIBODY, TOTAL: HEP B C TOTAL AB: NEGATIVE

## 2016-05-15 MED ORDER — LORAZEPAM 2 MG/ML IJ SOLN: 1.0000 mg | Freq: Four times a day (QID) | INTRAMUSCULAR | Status: AC | PRN

## 2016-05-15 MED ORDER — LORAZEPAM 1 MG PO TABS
1.0000 mg | ORAL_TABLET | Freq: Four times a day (QID) | ORAL | Status: AC | PRN
  Administered 2016-05-15: 1 mg via ORAL
  Filled 2016-05-15: qty 1

## 2016-05-15 MED ORDER — OXYCODONE HCL 5 MG PO TABS
ORAL_TABLET | ORAL | Status: AC
  Administered 2016-05-15: 5 mg via ORAL
  Filled 2016-05-15: qty 1

## 2016-05-15 NOTE — Progress Notes (Signed)
Dialysis treatment completed.  1500 mL ultrafiltrated.  1000 mL net fluid removal.  Patient status unchanged. Lung sounds clear to ausculation in all fields. No edema. Cardiac: Afib, aflutter.  Cleansed RIJ catheter with chlorhexidine.  Disconnected lines and flushed ports with saline per protocol.  Ports locked with heparin and capped per protocol.    Report given to bedside, RN [place].  Pt pulled IV and ripped off biopatch and dressing from RIJ at end of treatment.  IV site bandaged and catheter dressing changed.  Bedside RN updated.

## 2016-05-15 NOTE — Telephone Encounter (Signed)
-----   Message from Mena Goes, RN sent at 05/15/2016 10:55 AM EDT ----- Regarding: schedule  may be duplicate   ----- Message -----    From: Gabriel Earing, PA-C    Sent: 05/14/2016   4:42 PM      To: Vvs Charge Pool  S/p left BVT 05/14/16 by Dr. Donnetta Hutching.  F/u with him in 4 weeks.  He does not need a duplex.  Thanks, Aldona Bar

## 2016-05-15 NOTE — Progress Notes (Addendum)
  Postoperative hemodialysis access     Date of Surgery:  05/14/16 Surgeon: Janyla Biscoe  Subjective:  No pain and no complaints  PHYSICAL EXAMINATION:  Filed Vitals:   05/15/16 0437 05/15/16 0733  BP: 100/66 120/100  Pulse: 64 96  Temp: 98 F (36.7 C)   Resp: 18     Incisions are all clean and dry with steri strips in place Sensation in digits is intact;  There is  Thrill  There is bruit. The graft/fistula is palpable  Easily palpable left radial pulse   ASSESSMENT/PLAN:  Kenneth [name] is a 60 y.o. year old male who is s/p left BVT.  -graft/fistula is patent -pt does not have evidence of steal sx -f/u with Dr. Donnetta Hutching in 4-6 weeks to check maturation of AVF -will sign off-call as needed.   Leontine Locket, PA-C [facility] [phone]  I have examined the patient, reviewed and agree with above.No steal symptoms. Excellent size of basilic vein. Suspect very high likelihood that this will be successful for hemodialysis access  Curt Jews, MD 05/15/2016 8:27 AM

## 2016-05-15 NOTE — Progress Notes (Signed)
Hemodialysis nurses came to pick up pt for session and pt was lethargic and minimally following commands. This RN comes into room 2 minutes later and attempts to wake patient up with response. Answering questions appropriately. Hands grips and leg strength symmetrical. Tongue midline and facial symmetry present. Recalls name, place, time and situation correctly. CBG 136. VSS. 12 lead done. No issues on telemetry this morning per CMD. Pt has been in afibb-rate controlled.

## 2016-05-15 NOTE — Progress Notes (Signed)
Patient arrived to unit by bed.  Reviewed treatment plan and this RN agrees with plan.  Report received from bedside RN, [place].  Consent verified.  Patient Alert to self only.   Lung sounds diminished to ausculation in all fields. No edema. Cardiac:  Afib, aflutter.  Removed caps and cleansed RIJ catheter with chlorhedxidine.  Aspirated ports of heparin and flushed them with saline per protocol.  Connected and secured lines, initiated treatment at 1117.  UF Goal of 1575mL and net fluid removal 1L.  Will continue to monitor.

## 2016-05-15 NOTE — Telephone Encounter (Signed)
Sched appt 8/15 at 3:00. Spoke to pt's daughter and mailed appt letter to inform pt of appt.

## 2016-05-15 NOTE — Progress Notes (Signed)
TELEMETRY: Reviewed telemetry pt in Atrial fib with controlled rate 80-90s: Filed Vitals:   05/14/16 1715 05/14/16 1740 05/14/16 2136 05/15/16 0437  BP:  93/49 115/74 100/66  Pulse: 74 65 86 64  Temp:   98 F (36.7 C) 98 F (36.7 C)  TempSrc:   Oral Oral  Resp: 15 12 17 18   Height:      Weight:   194 lb 7.1 oz (88.2 kg)   SpO2: 99% 92% 93% 95%    Intake/Output Summary (Last 24 hours) at 05/15/16 0721 Last data filed at 05/15/16 0600  Gross per 24 hour  Intake 1500.5 ml  Output   1030 ml  Net  470.5 ml   Filed Weights   05/14/16 0750 05/14/16 1054 05/14/16 2136  Weight: 186 lb 8.2 oz (84.6 kg) 184 lb 4.9 oz (83.6 kg) 194 lb 7.1 oz (88.2 kg)    Subjective No complaints. Denies SOB, dizziness, chest pain.  . carvedilol  6.25 mg Oral BID WC  . folic acid  1 mg Oral Daily  . heparin  5,000 Units Subcutaneous Q8H  . isosorbide-hydrALAZINE  1 tablet Oral TID  . multivitamin with minerals  1 tablet Oral Daily  . sodium chloride flush  3 mL Intravenous Q12H  . thiamine  100 mg Oral Daily   Or  . thiamine  100 mg Intravenous Daily   . sodium chloride 10 mL/hr at 05/14/16 1157    LABS: Basic Metabolic Panel:  Recent Labs  05/12/16 1442  05/13/16 1610 05/14/16 0552  NA  --   < > 135 133*  K  --   < > 5.2* 3.9  CL  --   < > 106 104  CO2  --   < > 14* 15*  GLUCOSE  --   < > 105* 92  BUN  --   < > 124* 92*  CREATININE  --   < > 13.30* 11.04*  CALCIUM  --   < > 8.1* 7.4*  PHOS 8.0*  --  9.1*  --   < > = values in this interval not displayed. Liver Function Tests:  Recent Labs  05/12/16 0736 05/13/16 0159 05/13/16 1610  AST 73* 237*  --   ALT 79* 161*  --   ALKPHOS 59 102  --   BILITOT 1.1 1.1  --   PROT 8.5* 7.4  --   ALBUMIN 4.0 3.1* 3.2*   No results for input(s): LIPASE, AMYLASE in the last 72 hours. CBC:  Recent Labs  05/12/16 0736  05/13/16 1611 05/14/16 0552  WBC 5.1  < > 5.5 6.4  NEUTROABS 3.7  --   --   --   HGB 9.7*  < > 10.1* 9.4*    HCT 27.7*  < > 30.2* 27.3*  MCV 87.4  < > 88.8 88.1  PLT 185  < > 251 237  < > = values in this interval not displayed. Cardiac Enzymes:  Recent Labs  05/12/16 1629 05/12/16 2045 05/13/16 0159  TROPONINI 0.25* 0.23* 0.28*   BNP: No results for input(s): PROBNP in the last 72 hours. D-Dimer: No results for input(s): DDIMER in the last 72 hours. Hemoglobin A1C: No results for input(s): HGBA1C in the last 72 hours. Fasting Lipid Panel: No results for input(s): CHOL, HDL, LDLCALC, TRIG, CHOLHDL, LDLDIRECT in the last 72 hours. Thyroid Function Tests:  Recent Labs  05/13/16 0503  TSH 4.868*     Radiology/Studies:  No results found.  PHYSICAL  EXAM General: Well nourished AA male, NAD Psych: normal Neuro: Alert and oriented X 3, Moves all extremities spontaneously. HEENT: Normal Neck: Supple, + JVD. Lungs: Resp regular and unlabored, CTA. Heart: RRR no s3, s4, 2/6 systolic murmur. Abdomen: Soft, non-tender, non-distended, BS + x 4.  Extremities: No clubbing, cyanosis or edema. DP/PT/Radials 2+ and equal bilaterally. Surgical bandage on left arm   ASSESSMENT AND PLAN: 1. Acute on Chronic combined CHF: Last Echo in 2015 showed an EF of 55-60 %, when he was last admitted for acute on chronic combined CHF. Repeat this admission shows decrease to 25-30% with diffuse hypokinesis, with PA pressure of 25mmHg.   -- Trop with flat trend 0.25>0.23>0.28, likely related to acute renal failure and uncontrolled hypertension. C/o atypical chest pain symptoms. Could consider for outpatient myoview after he is past this "acute" state.  -- continue coreg, hydralazine/nitrate combination. -- repeat Echo in 3 months.  2. Atrial fibrillation/flutter:  Appears to be a new finding this admission. Rate controlled at this time. On beta blocker.  -- This patients CHA2DS2-VASc Score and unadjusted Ischemic Stroke Rate (% per year) is equal to 2.2 % stroke rate/year from a score of  2 Above score calculated as 1 point each if present [CHF, HTN, DM, Vascular=MI/PAD/Aortic Plaque, Age if 65-74, or Male] Above score calculated as 2 points each if present [Age > 75, or Stroke/TIA/TE] -- He reports drinking at least a 5th of liquor a day, with no plans for stopping at this time. Given this and his hx of noncompliance, would not consider him a good candidate for anticoagulation at this time. With renal failure he is not a candidate for a NOAC. If he demonstrates good follow up and Etoh abstinence we could reconsider coumadin.  3. CKD stage 5: Admission Cr 13, nephrology following. On hemodialysis. AV fistula placed yesterday. -- Not a candidate for ACE/ARB therapy given renal failure  4. HTN: BP now in excellent control. Continue Coreg and Bidil.  5. Anemia  6. Abnormal LFTs  7. S/p AV fistula.  8. Etoh abuse  Present on Admission:  . Renal failure . CKD (chronic kidney disease), stage IV ([place]) . Essential hypertension . Systolic and diastolic CHF, acute on chronic ([place]) . Alcohol abuse  Signed, Peter [name], [place] 05/15/2016 7:21 AM

## 2016-05-15 NOTE — Progress Notes (Signed)
PROGRESS NOTE  Kenneth [name] [id number] DOB: 17-May-1956 DOA: 05/12/2016   PCP: [name], NP   LOS: 3 days   Brief Narrative: 60 y.o. male with history of hypertension, hyperlipidemia, combined congestive heart failure, ejection fraction 30-35% with grade 3 diastolic dysfunction, chronic kidney disease stage 3-4, medical noncompliance and alcohol abuse presented to [facility] ED due to c/o upper back pain for the last two weeks. He was found to be in [place] renal failure and transferred to [facility] for renal consult and HD initiation.   Assessment & Plan:  Acute encephalopathy 7/20 - noted this am after breakfast and HD not done as a result - unclear etiology - pt currently stable, VSS, protecting airways  - CXR to r/o aspiration, CT head also requested, one set of troponin also requested  - monitor closely   AKI on CKD, stage V - Progressive CKD now stage 5 due to longstanding, poorly controlled HTN as well as NSAIDs - nephrology following, appreciate assistance  - VVS consulted for placement of HD cath and AVF/AVG, appreciate assistance   A fib / A flutter with variable conduction - patient's CHA2DS2-VASc Score for Stroke Risk is 4.  - Cardiology consulted  Chest pain  - patient reports intermittent chest pain over the past month especially with exertion - cardiology consulted, appreciate assistance  - no chest pain this AM  Acute on chronic combined systolic and diastolic CHF with new pulmonary hypertension - Last Echo in 2015 showed an EF of 55-60 %, when he was last admitted for acute on chronic combined CHF.  - Repeat this admission shows decrease to 25-30% with diffuse hypokinesis, with PA pressure of 54mmHg.  - Trop with flat trend 0.25>0.23>0.28, likely related to acute renal failure and uncontrolled hypertension.  - on coreg, isosorbide - hydralazine   Alcohol abuse - elevated LFTs c/w ETOH - keep on CIWA  Anemia of Chronic disease, CKD - no signs of bleeding  - CBC in  AM  Hypertensive urgency not present on admission  - on coreg, isosorbide - hydralazine  - SBP in 100's this AM - continue same regimen for now   DVT prophylaxis: heparin Code Status: Full Family Communication: no family bedside Disposition Plan: remain inpatient, home once cleared by consultants   Consultants:   Nephrology   Cardiology  Vascular surgery   Procedures:   2D echo:-When compared to the prior study from 12/09/2013 LVEF has decreased, previously 60-65%, now 25-30% with diffuse hypokinesis. There is also a moderate RV systolic dysfunction. Severe pulmonary hypertension is new.  Antimicrobials:  None    Subjective: Flat affect, was fine in AM before breakfast but suddenly became lethargic.   Objective: Filed Vitals:   05/15/16 1207 05/15/16 1237 05/15/16 1307 05/15/16 1337  BP: 132/69 110/62 110/61 128/72  Pulse: 84 90 89 78  Temp:      TempSrc:      Resp:      Height:      Weight:      SpO2:        Intake/Output Summary (Last 24 hours) at 05/15/16 1410 Last data filed at 05/15/16 0600  Gross per 24 hour  Intake 1500.5 ml  Output     30 ml  Net 1470.5 ml   Filed Weights   05/14/16 1054 05/14/16 2136 05/15/16 1116  Weight: 83.6 kg (184 lb 4.9 oz) 88.2 kg (194 lb 7.1 oz) 85 kg (187 lb 6.3 oz)   Examination:  Filed Vitals:   05/15/16 1207 05/15/16  1237 05/15/16 1307 05/15/16 1337  BP: 132/69 110/62 110/61 128/72  Pulse: 84 90 89 78  Temp:      TempSrc:      Resp:      Height:      Weight:      SpO2:       Eyes: PERRL ENMT: Mucous membranes are moist.  Respiratory: bibasial crackles, no wheezing. Cardiovascular: irregular, tachycardic. Regular rate and rhythm, no murmurs / rubs / gallops. Abdomen: no tenderness. Bowel sounds positive.  Neurologic: lethargic but can open eyes and follow some commands, moving all 4 extremities spontaneously and withdraws to nail bed pressing or sternal rub   Data Reviewed: I have personally reviewed  following labs and imaging studies  CBC:  Recent Labs Lab 05/12/16 0736 05/12/16 1238 05/12/16 1442 05/13/16 0159 05/13/16 1611 05/14/16 0552 05/15/16 0650  WBC 5.1 5.4  --  5.3 5.5 6.4 6.6  NEUTROABS 3.7  --   --   --   --   --   --   HGB 9.7* 8.7*  --  9.5* 10.1* 9.4* 8.1*  HCT 27.7* 25.6* 29.0* 28.3* 30.2* 27.3* 24.1*  MCV 87.4 88.9  --  89.0 88.8 88.1 90.3  PLT 185 155  --  181 251 237 [id number]   Basic Metabolic Panel:  Recent Labs Lab 05/12/16 0736 05/12/16 1238 05/12/16 1442 05/13/16 0159 05/13/16 1610 05/14/16 0552 05/15/16 0650  NA 133*  --   --  132* 135 133* 134*  K 4.7  --   --  4.8 5.2* 3.9 3.8  CL 105  --   --  107 106 104 104  CO2 13*  --   --  12* 14* 15* 20*  GLUCOSE 87  --   --  100* 105* 92 101*  BUN 120*  --   --  122* 124* 92* 57*  CREATININE 13.03* 13.01*  --  13.29* 13.30* 11.04* 8.83*  CALCIUM 8.0*  --   --  7.6* 8.1* 7.4* 7.2*  PHOS  --   --  8.0*  --  9.1*  --  4.3   Liver Function Tests:  Recent Labs Lab 05/12/16 0736 05/13/16 0159 05/13/16 1610 05/15/16 0650  AST 73* 237*  --   --   ALT 79* 161*  --   --   ALKPHOS 59 102  --   --   BILITOT 1.1 1.1  --   --   PROT 8.5* 7.4  --   --   ALBUMIN 4.0 3.1* 3.2* 2.7*   Coagulation Profile:  Recent Labs Lab 05/13/16 0503  INR 1.51*   Cardiac Enzymes:  Recent Labs Lab 05/12/16 0736 05/12/16 1629 05/12/16 2045 05/13/16 0159 05/15/16 1047  TROPONINI 0.32* 0.25* 0.23* 0.28* 1.25*   Thyroid Function Tests:  Recent Labs  05/13/16 0503  TSH 4.868*   Anemia Panel:  Recent Labs  05/12/16 1442  VITAMINB12 1136*  FERRITIN 287  TIBC 274  IRON 86   Urine analysis:    Component Value Date/Time   COLORURINE YELLOW 05/12/2016 0834   APPEARANCEUR CLOUDY* 05/12/2016 0834   LABSPEC 1.014 05/12/2016 0834   PHURINE 5.0 05/12/2016 0834   GLUCOSEU NEGATIVE 05/12/2016 0834   HGBUR MODERATE* 05/12/2016 0834   BILIRUBINUR NEGATIVE 05/12/2016 0834   KETONESUR NEGATIVE 05/12/2016  0834   PROTEINUR 100* 05/12/2016 0834   UROBILINOGEN 0.2 12/09/2013 0013   NITRITE NEGATIVE 05/12/2016 0834   LEUKOCYTESUR NEGATIVE 05/12/2016 0834   Recent Results (from the past 240  hour(s))  Urine culture     Status: Abnormal   Collection Time: 05/12/16  8:24 AM  Result Value Ref Range Status   Specimen Description URINE, CLEAN CATCH  Final   Special Requests NONE  Final   Culture (A)  Final    7,000 COLONIES/mL INSIGNIFICANT GROWTH Performed at [facility]    Report Status 05/13/2016 FINAL  Final    Radiology Studies: Ct Head Wo Contrast  05/15/2016  CLINICAL DATA:  Lethargy beginning this morning. EXAM: CT HEAD WITHOUT CONTRAST TECHNIQUE: Contiguous axial images were obtained from the base of the skull through the vertex without intravenous contrast. COMPARISON:  None. FINDINGS: The brain shows advanced generalized atrophy. There chronic small-vessel ischemic changes throughout the deep and subcortical white matter. Old lacunar infarction right basal ganglia. No sign of acute infarction, mass lesion, hemorrhage, hydrocephalus or extra-axial collection. Sinuses, middle ears and mastoids are clear. No calvarial abnormality. There is atherosclerotic calcification of the major vessels at the base of the brain. IMPRESSION: No acute finding.  Advanced atrophy and chronic ischemic changes. Electronically Signed   By: Nelson Chimes M.D.   On: 05/15/2016 10:36   Dg Chest Port 1 View  05/15/2016  CLINICAL DATA:  Lethargy.  Chronic renal disease. EXAM: PORTABLE CHEST 1 VIEW COMPARISON:  PA and lateral chest 05/12/2016. Single view of the chest 12/08/2014. FINDINGS: There is marked cardiomegaly with pulmonary vascular congestion. The patient has new small to moderate bilateral pleural effusions with basilar airspace disease, likely atelectasis. No pneumothorax. IMPRESSION: New small to moderate bilateral pleural effusions and basilar atelectasis since the most recent exam. Cardiomegaly and  pulmonary vascular congestion. Electronically Signed   By: Inge Rise M.D.   On: 05/15/2016 10:31    Scheduled Meds: . carvedilol  6.25 mg Oral BID WC  . folic acid  1 mg Oral Daily  . heparin  5,000 Units Subcutaneous Q8H  . isosorbide-hydrALAZINE  1 tablet Oral TID  . multivitamin with minerals  1 tablet Oral Daily  . sodium chloride flush  3 mL Intravenous Q12H  . thiamine  100 mg Oral Daily   Continuous Infusions: . sodium chloride 10 mL/hr at 05/14/16 1157   Faye Ramsay, MD  [facility] Pager [phone]  If 7PM-7AM, please contact night-coverage [email or web address] Password [email or web address]   If 7PM-7AM, please contact night-coverage [email or web address] Password [email or web address] 05/15/2016, 2:10 PM

## 2016-05-15 NOTE — Progress Notes (Signed)
Patient ID: Kenneth [name], male   DOB: 1956-10-18, 60 y.o.   MRN: [id number] S:Kenneth [name] was lethargic and delirious upon arrival at HD O:BP 120/100 mmHg  Pulse 96  Temp(Src) 98 F (36.7 C) (Oral)  Resp 18  Ht 5\' 10"  (1.778 m)  Wt 88.2 kg (194 lb 7.1 oz)  BMI 27.90 kg/m2  SpO2 95%  Intake/Output Summary (Last 24 hours) at 05/15/16 0845 Last data filed at 05/15/16 0600  Gross per 24 hour  Intake 1500.5 ml  Output   1030 ml  Net  470.5 ml   Intake/Output: I/O last 3 completed shifts: In: 1500.5 [P.O.:240; I.V.:1210.5; IV Piggyback:50] Out: [id number] [Other:1000; Blood:30]  Intake/Output this shift:    Weight change: -3 kg (-6 lb 9.8 oz) [id number] CVS: no rub Resp:cta [id number] Ext:no edema,    Recent Labs Lab 05/12/16 0736 05/12/16 1238 05/12/16 1442 05/13/16 0159 05/13/16 1610 05/14/16 0552 05/15/16 0650  NA 133*  --   --  132* 135 133* 134*  K 4.7  --   --  4.8 5.2* 3.9 3.8  CL 105  --   --  107 106 104 104  CO2 13*  --   --  12* 14* 15* 20*  GLUCOSE 87  --   --  100* 105* 92 101*  BUN 120*  --   --  122* 124* 92* 57*  CREATININE 13.03* 13.01*  --  13.29* 13.30* 11.04* 8.83*  ALBUMIN 4.0  --   --  3.1* 3.2*  --  2.7*  CALCIUM 8.0*  --   --  7.6* 8.1* 7.4* 7.2*  PHOS  --   --  8.0*  --  9.1*  --  4.3  AST 73*  --   --  237*  --   --   --   ALT 79*  --   --  161*  --   --   --    Liver Function Tests:  Recent Labs Lab 05/12/16 0736 05/13/16 0159 05/13/16 1610 05/15/16 0650  AST 73* 237*  --   --   ALT 79* 161*  --   --   ALKPHOS 59 102  --   --   BILITOT 1.1 1.1  --   --   PROT 8.5* 7.4  --   --   ALBUMIN 4.0 3.1* 3.2* 2.7*   No results for input(s): LIPASE, AMYLASE in the last 168 hours. No results for input(s): AMMONIA in the last 168 hours. CBC:  Recent Labs Lab 05/12/16 0736 05/12/16 1238  05/13/16 0159 05/13/16 1611 05/14/16 0552 05/15/16 0650  WBC 5.1 5.4  --  5.3 5.5 6.4 6.6  NEUTROABS 3.7  --   --   --   --   --   --   HGB 9.7*  8.7*  --  9.5* 10.1* 9.4* 8.1*  HCT 27.7* 25.6*  < > 28.3* 30.2* 27.3* 24.1*  MCV 87.4 88.9  --  89.0 88.8 88.1 90.3  PLT 185 155  --  181 251 237 207  < > = values in this interval not displayed. Cardiac Enzymes:  Recent Labs Lab 05/12/16 0736 05/12/16 1629 05/12/16 2045 05/13/16 0159  TROPONINI 0.32* 0.25* 0.23* 0.28*   CBG:  Recent Labs Lab 05/14/16 1707 05/14/16 1747  GLUCAP 121* 112*    Iron Studies:  Recent Labs  05/12/16 1442  IRON 86  TIBC 274  FERRITIN 287   Studies/Results: No results found. . carvedilol  6.25 mg Oral BID WC  .  folic acid  1 mg Oral Daily  . heparin  5,000 Units Subcutaneous Q8H  . isosorbide-hydrALAZINE  1 tablet Oral TID  . multivitamin with minerals  1 tablet Oral Daily  . sodium chloride flush  3 mL Intravenous Q12H  . thiamine  100 mg Oral Daily   Or  . thiamine  100 mg Intravenous Daily    BMET    Component Value Date/Time   NA 134* 05/15/2016 0650   K 3.8 05/15/2016 0650   CL 104 05/15/2016 0650   CO2 20* 05/15/2016 0650   GLUCOSE 101* 05/15/2016 0650   BUN 57* 05/15/2016 0650   CREATININE 8.83* 05/15/2016 0650   CALCIUM 7.2* 05/15/2016 0650   GFRNONAA 6* 05/15/2016 0650   GFRAA 7* 05/15/2016 0650   CBC    Component Value Date/Time   WBC 6.6 05/15/2016 0650   RBC 2.67* 05/15/2016 0650   HGB 8.1* 05/15/2016 0650   HCT 24.1* 05/15/2016 0650   HCT 29.0* 05/12/2016 1442   PLT 207 05/15/2016 0650   MCV 90.3 05/15/2016 0650   MCH 30.3 05/15/2016 0650   MCHC 33.6 05/15/2016 0650   RDW 17.5* 05/15/2016 0650   LYMPHSABS 0.8 05/12/2016 0736   MONOABS 0.3 05/12/2016 0736   EOSABS 0.2 05/12/2016 0736   BASOSABS 0.0 05/12/2016 0736     Assessment/Plan: 1. Delirium- acute onset this morning.  CT scan negative and cbg 113.  Unclear etiology. Will check ABG to r/o hypercarbia.  Dr. Doyle [name] aware and began workup.  Doubt dialysis dysequilibrium as he has had only 2 short sessions of HD 2. Progressive CKD now stage 5 due  to longstanding, poorly controlled HTN as well as NSAIDs. I discussed the severity and chronicity of his kidney disease and discussed the need to initiate HD, however I also stressed the fact that this is an irreversible process and he will require ongoing hemodialysis indefinitely. I also stressed the importance of compliance with dialysis and the increased risk of hospitalization and/or sudden death for non adherence of maintenance dialysis. He has agreed to proceed; "I have to if I don't want to die".  1. Appreciate VVS assistance  2. CLIP process started. 3. Plan for 3rd treatment today and then start TTS schedule 3. Anemia of chronic disease- will check iron stores and initiate ESA, Will also check SPEP/UPEP 4. CKD-MBD- will check ca/phos/iPTH and initiate binders/vit D as needed 5. Vascular access- s/p LBVT 05/14/16 by Dr. Donnetta [name] as well as HD cath 6. HTN, malignant- hypertensive heart disease and CKD. Resume outpatient meds and avoid NSAIDs/cox-II I's 7. Metabolic acidosis- will plan for HD tomorrow after HD cath placement to correct metabolic derangements. 8. Abnormal lft's- ?related to etoh. Will check hepatitis panel 9. Elevated troponin- per primary svc. 10. Disposition- will need outpatient HD to be arranged as well as AVF/AVG placement before he can be placed.  [place]

## 2016-05-16 LAB — RENAL FUNCTION PANEL
ALBUMIN: 2.9 g/dL — AB (ref 3.5–5.0)
ANION GAP: 8 (ref 5–15)
BUN: 29 mg/dL — ABNORMAL HIGH (ref 6–20)
CO2: 24 mmol/L (ref 22–32)
Calcium: 7.6 mg/dL — ABNORMAL LOW (ref 8.9–10.3)
Chloride: 106 mmol/L (ref 101–111)
Creatinine, Ser: 6.43 mg/dL — ABNORMAL HIGH (ref 0.61–1.24)
GFR calc non Af Amer: 8 mL/min — ABNORMAL LOW (ref >60)
GFR, EST AFRICAN AMERICAN: 10 mL/min — AB (ref >60)
GLUCOSE: 108 mg/dL — AB (ref 65–99)
PHOSPHORUS: 2.6 mg/dL (ref 2.5–4.6)
POTASSIUM: 4.1 mmol/L (ref 3.5–5.1)
Sodium: 138 mmol/L (ref 135–145)

## 2016-05-16 LAB — TROPONIN I
TROPONIN I: 1.3 ng/mL — AB (ref <0.03)
Troponin I: 1.11 ng/mL (ref <0.03)

## 2016-05-16 LAB — CBC
HEMATOCRIT: 25.5 % — AB (ref 39.0–52.0)
HEMOGLOBIN: 8.2 g/dL — AB (ref 13.0–17.0)
MCH: 29.7 pg (ref 26.0–34.0)
MCHC: 32.2 g/dL (ref 30.0–36.0)
MCV: 92.4 fL (ref 78.0–100.0)
Platelets: 198 10*3/uL (ref 150–400)
RBC: 2.76 MIL/uL — ABNORMAL LOW (ref 4.22–5.81)
RDW: 17.8 % — ABNORMAL HIGH (ref 11.5–15.5)
WBC: 6.6 10*3/uL (ref 4.0–10.5)

## 2016-05-16 MED ORDER — DARBEPOETIN ALFA 60 MCG/0.3ML IJ SOSY
60.0000 ug | PREFILLED_SYRINGE | INTRAMUSCULAR | Status: DC
  Administered 2016-05-17: 60 ug via INTRAVENOUS
  Filled 2016-05-16: qty 0.3

## 2016-05-16 MED ORDER — DOXERCALCIFEROL 4 MCG/2ML IV SOLN
2.0000 ug | INTRAVENOUS | Status: DC
  Administered 2016-05-17 – 2016-05-22 (×3): 2 ug via INTRAVENOUS
  Filled 2016-05-16 (×3): qty 2

## 2016-05-16 NOTE — Evaluation (Signed)
Clinical/Bedside Swallow Evaluation Patient Details  Name: Kenneth [name] MRN: [id number] Date of Birth: December 27, 1955  Today's Date: 05/16/2016 Time: SLP Start Time (ACUTE ONLY): 1025 SLP Stop Time (ACUTE ONLY): 1057 SLP Time Calculation (min) (ACUTE ONLY): 32 min  Past Medical History:  Past Medical History  Diagnosis Date  . Hypertension   . CKD (chronic kidney disease) stage 4, GFR 15-29 ml/min (HCC)     renal failure; due to hypertensive nephrosclerosis  . Chronic combined systolic and diastolic heart failure (HCC)     Echocardiogram 09/22/11: Moderate LVH, EF [id number], grade 3 diastolic dysfunction, mild MR, moderate to severe LAE, mild RVE, mild to moderate TR, small to moderate pericardial effusion  . Cardiomyopathy secondary     likely related to HTN heart disease; possibly ETOH related as well  . History of alcohol abuse    Past Surgical History:  Past Surgical History  Procedure Laterality Date  . Peripheral vascular catheterization N/A 05/13/2016    Procedure: Dialysis/Perma Catheter Insertion;  Surgeon: Serafina Mitchell, MD;  Location: [facility];  Service: Cardiovascular;  Laterality: N/A;  . Av fistula placement Left 05/14/2016    Procedure: LEFT ARM BASILIC VEIN TRANSPOSITION;  Surgeon: Rosetta Posner, MD;  Location: [facility] OR;  Service: Vascular;  Laterality: Left;   HPI:  60 year old male admitted 05/12/16 with back pain. PMH significant for NHTN, CKD, medication noncompliance, etoh abuse. Order received for BSE after pt exhibiting coughing after po intake this morning.   Assessment / Plan / Recommendation Clinical Impression  Pt presents with adequate oral motor strength and function. Pt noted to be weak and had difficulty self feeding, raising risk of aspiration with fatigue. RN reported pt had difficulty at breakfast this morning, but indicated pt was sleepy and not positioned upright. No overt s/s aspiration observed with cup sips of thin liquid (multiple presentations),  puree, or solid consistency. Cough response was elicited following thin liquid via straw. Recommend soft, chopped solids, thin liquid via cup only, and FULL supervision during meals, assisting with feeding for energy conservation. Feed only when pt is fully awake and alert, and is seated in an upright position, preferably in a chair. Frequent rest breaks are also recommended. Safe swallow precautions posted at [facility]. ST will follow for assessment of diet tolerance and education regarding importance of adherence to safe swallow precautions.    Aspiration Risk  Mild-Moderate aspiration risk    Diet Recommendation Dysphagia 3 (Mech soft);Thin liquid, chop meats  Liquid Administration via: No straw;Cup Medication Administration: Whole meds with liquid Supervision: Staff to assist with self feeding;Full supervision/cueing for compensatory strategies Compensations: Minimize environmental distractions;Slow rate;Small sips/bites Postural Changes: Remain upright for at least 30 minutes after po intake;Seated upright at 90 degrees    Other  Recommendations Oral Care Recommendations: Oral care BID   Follow up Recommendations   (TBD)    Frequency and Duration min 2x/week  2 weeks       Prognosis Prognosis for Safe Diet Advancement: Fair Barriers to Reach Goals: Cognitive deficits      Swallow Study   General Date of Onset: 05/12/16 HPI: 60 year old male admitted 05/12/16 with back pain. PMH significant for NHTN, CKD, medication noncompliance, etoh abuse. Order received for BSE after pt exhibiting coughing after po intake this morning. Type of Study: Bedside Swallow Evaluation Previous Swallow Assessment: none Diet Prior to this Study: Regular;Thin liquids Temperature Spikes Noted: No Respiratory Status: Room air History of Recent Intubation: No Behavior/Cognition: Cooperative;Confused;Lethargic/Drowsy;Distractible;Requires  cueing Oral Cavity Assessment: Within Functional Limits Oral Care  Completed by SLP: No Oral Cavity - Dentition: Adequate natural dentition Vision: Functional for self-feeding Self-Feeding Abilities: Needs assist;Needs set up Patient Positioning: Upright in bed Baseline Vocal Quality: Normal Volitional Cough: Strong Volitional Swallow: Unable to elicit    Oral/Motor/Sensory Function Overall Oral Motor/Sensory Function: Within functional limits   Ice Chips Ice chips: Not tested   Thin Liquid Thin Liquid: Impaired Presentation: Cup;Straw Pharyngeal  Phase Impairments: Cough - Immediate (cough noted following straw use. No cough when cup sips used)    Nectar Thick Nectar Thick Liquid: Not tested   Honey Thick Honey Thick Liquid: Not tested   Puree Puree: Within functional limits Presentation: Spoon   Solid    Solid: Within functional limits Presentation: Bode, Celia Brown 05/16/2016,11:11 AM  Enriqueta Shutter. Quentin Ore [facility], [place] [phone]

## 2016-05-16 NOTE — Progress Notes (Signed)
Subjective:  In room soft spoken and pleasantly confused/ noted HD yesterday  per HD RN="Pt pulled IV and ripped off biopatch and dressing from RIJ at end of treatment" he does not remember his actions yesterday and does not remember HD. But does agree to continue Hemodialysis when I asked this am .   Objective Vital signs in last 24 hours: Filed Vitals:   05/15/16 1452 05/15/16 1700 05/15/16 2138 05/16/16 0507  BP: 143/81 107/82 139/75 111/77  Pulse: 100 73 98 100  Temp:  98.7 F (37.1 C) 98.3 F (36.8 C) 97.6 F (36.4 C)  TempSrc:  Oral Oral Oral  Resp:  18 19 18   Height:      Weight:   85.5 kg (188 lb 7.9 oz)   SpO2:  96% 98% 100%   Weight change: 0.4 kg (14.1 oz)  Physical Exam: General: alert / NAD/ "Tuesday" pleasantly confused  Heart: Irreg/ Irreg   Heart Rate  stable 70s / No rub  Lungs: CTA / Nonlabored breathing  Abdomen: BS pos soft , NT, ND Extremities:no pedal edema  Dialysis Access: Pos bruit LUA AVF/ R IJ Perm cath dressing intact dry / clean    Problem/Plan: 1. Delirium- acute onset yesterday am  Morning  CT scan negative and cbg 113. Unclear etiology. Will check ABG to r/o hypercarbia. Dr. Doyle Askew aware and began workup. Doubt dialysis dysequilibrium as he has had only 2 short sessions of HD/ now 3rd session yest   2. Progressive CKD now stage 5 due to longstanding, poorly controlled HTN as well as NSAIDs. DR Taha Dimond  discussed the severity and chronicity of his kidney disease and discussed the need to initiate HD, however  also stressed the fact that this is an irreversible process and he will require ongoing hemodialysis indefinitely/ also stressed the importance of compliance with dialysis and the increased risk of hospitalization and/or sudden death for non adherence of maintenance dialysis. He has agreed to proceed; "I have to if I don't want to die".  1. Appreciate VVS assistance  2. CLIP process started. 3. HAd  3rd treatment yest  and then start  TTS schedule 3. Anemia of chronic disease- will check iron stores and initiate ESA, Will also check SPEP/UPEP 4. CKD-MBD- will check ca/phos/iPTH and no binders/ with phos 2.6 vit D as needed 5. Vascular access- s/p LBVT 05/14/16 by Dr. Donnetta Hutching as well as HD cath 6. HTN, malignant- hypertensive heart disease and CKD. Improved with hd 111/77 this am on low dose Coreg and Bidil   7. Metabolic acidosis- resolving  now with  HD txs   8. Abnormal lft's- ?related to etoh. Will check hepatitis panel 9. Elevated troponin- per primary svc. 10. Disposition- will need outpatient HD to be arranged as well as AVF/AVG placement before he can be placed.   Ernest Haber, PA-C [facility] [phone] 05/16/2016,9:01 AM  LOS: 4 days   Labs: Basic Metabolic Panel:  Recent Labs Lab 05/13/16 1610 05/14/16 0552 05/15/16 0650 05/16/16 0130  NA 135 133* 134* 138  K 5.2* 3.9 3.8 4.1  CL 106 104 104 106  CO2 14* 15* 20* 24  GLUCOSE 105* 92 101* 108*  BUN 124* 92* 57* 29*  CREATININE 13.30* 11.04* 8.83* 6.43*  CALCIUM 8.1* 7.4* 7.2* 7.6*  PHOS 9.1*  --  4.3 2.6   Liver Function Tests:  Recent Labs Lab 05/12/16 0736 05/13/16 0159 05/13/16 1610 05/15/16 0650 05/16/16 0130  AST 73* 237*  --   --   --  ALT 79* 161*  --   --   --   ALKPHOS 59 102  --   --   --   BILITOT 1.1 1.1  --   --   --   PROT 8.5* 7.4  --   --   --   ALBUMIN 4.0 3.1* 3.2* 2.7* 2.9*   No results for input(s): LIPASE, AMYLASE in the last 168 hours. No results for input(s): AMMONIA in the last 168 hours. CBC:  Recent Labs Lab 05/12/16 0736  05/13/16 0159 05/13/16 1611 05/14/16 0552 05/15/16 0650 05/16/16 0130  WBC 5.1  < > 5.3 5.5 6.4 6.6 6.6  NEUTROABS 3.7  --   --   --   --   --   --   HGB 9.7*  < > 9.5* 10.1* 9.4* 8.1* 8.2*  HCT 27.7*  < > 28.3* 30.2* 27.3* 24.1* 25.5*  MCV 87.4  < > 89.0 88.8 88.1 90.3 92.4  PLT 185  < > 181 251 237 207 198  < > = values in this interval not  displayed. Cardiac Enzymes:  Recent Labs Lab 05/12/16 2045 05/13/16 0159 05/15/16 1047 05/15/16 1948 05/16/16 0130  TROPONINI 0.23* 0.28* 1.25* 1.30* 1.11*   CBG:  Recent Labs Lab 05/14/16 1707 05/14/16 1747 05/15/16 0943  GLUCAP 121* 112* 134*    Studies/Results: Ct Head Wo Contrast  05/15/2016  CLINICAL DATA:  Lethargy beginning this morning. EXAM: CT HEAD WITHOUT CONTRAST TECHNIQUE: Contiguous axial images were obtained from the base of the skull through the vertex without intravenous contrast. COMPARISON:  None. FINDINGS: The brain shows advanced generalized atrophy. There chronic small-vessel ischemic changes throughout the deep and subcortical white matter. Old lacunar infarction right basal ganglia. No sign of acute infarction, mass lesion, hemorrhage, hydrocephalus or extra-axial collection. Sinuses, middle ears and mastoids are clear. No calvarial abnormality. There is atherosclerotic calcification of the major vessels at the base of the brain. IMPRESSION: No acute finding.  Advanced atrophy and chronic ischemic changes. Electronically Signed   By: Nelson Chimes M.D.   On: 05/15/2016 10:36   Dg Chest Port 1 View  05/15/2016  CLINICAL DATA:  Lethargy.  Chronic renal disease. EXAM: PORTABLE CHEST 1 VIEW COMPARISON:  PA and lateral chest 05/12/2016. Single view of the chest 12/08/2014. FINDINGS: There is marked cardiomegaly with pulmonary vascular congestion. The patient has new small to moderate bilateral pleural effusions with basilar airspace disease, likely atelectasis. No pneumothorax. IMPRESSION: New small to moderate bilateral pleural effusions and basilar atelectasis since the most recent exam. Cardiomegaly and pulmonary vascular congestion. Electronically Signed   By: Inge Rise M.D.   On: 05/15/2016 10:31   Medications: . sodium chloride 10 mL/hr at 05/14/16 1157   . carvedilol  6.25 mg Oral BID WC  . folic acid  1 mg Oral Daily  . heparin  5,000 Units  Subcutaneous Q8H  . isosorbide-hydrALAZINE  1 tablet Oral TID  . multivitamin with minerals  1 tablet Oral Daily  . sodium chloride flush  3 mL Intravenous Q12H  . thiamine  100 mg Oral Daily     I have seen and examined this patient and agree with plan as outlined by Ernest Haber, PA-C. [place] John A Lezlie Ritchey,MD 05/16/2016 11:06 AM

## 2016-05-16 NOTE — Progress Notes (Signed)
TELEMETRY: Reviewed telemetry pt in Atrial fib with controlled rate 80-90s: Filed Vitals:   05/15/16 1700 05/15/16 2138 05/16/16 0507 05/16/16 0944  BP: 107/82 139/75 111/77 114/69  Pulse: 73 98 100 88  Temp: 98.7 F (37.1 C) 98.3 F (36.8 C) 97.6 F (36.4 C) 97.6 F (36.4 C)  TempSrc: Oral Oral Oral Oral  Resp: 18 19 18 18   Height:      Weight:  188 lb 7.9 oz (85.5 kg)    SpO2: 96% 98% 100% 97%    Intake/Output Summary (Last 24 hours) at 05/16/16 1425 Last data filed at 05/16/16 0900  Gross per 24 hour  Intake    600 ml  Output   1000 ml  Net   -400 ml   Filed Weights   05/15/16 1116 05/15/16 1449 05/15/16 2138  Weight: 187 lb 6.3 oz (85 kg) 185 lb 3 oz (84 kg) 188 lb 7.9 oz (85.5 kg)    Subjective No complaints. Denies SOB, dizziness, chest pain.  . carvedilol  6.25 mg Oral BID WC  . [START ON 05/17/2016] darbepoetin (ARANESP) injection - DIALYSIS  60 mcg Intravenous Q Sat-HD  . [START ON 05/17/2016] doxercalciferol  2 mcg Intravenous Q T,Th,Sa-HD  . folic acid  1 mg Oral Daily  . heparin  5,000 Units Subcutaneous Q8H  . isosorbide-hydrALAZINE  1 tablet Oral TID  . multivitamin with minerals  1 tablet Oral Daily  . sodium chloride flush  3 mL Intravenous Q12H  . thiamine  100 mg Oral Daily   . sodium chloride 10 mL/hr at 05/14/16 1157    LABS: Basic Metabolic Panel:  Recent Labs  05/15/16 0650 05/16/16 0130  NA 134* 138  K 3.8 4.1  CL 104 106  CO2 20* 24  GLUCOSE 101* 108*  BUN 57* 29*  CREATININE 8.83* 6.43*  CALCIUM 7.2* 7.6*  PHOS 4.3 2.6   Liver Function Tests:  Recent Labs  05/15/16 0650 05/16/16 0130  ALBUMIN 2.7* 2.9*   No results for input(s): LIPASE, AMYLASE in the last 72 hours. CBC:  Recent Labs  05/15/16 0650 05/16/16 0130  WBC 6.6 6.6  HGB 8.1* 8.2*  HCT 24.1* 25.5*  MCV 90.3 92.4  PLT 207 198   Cardiac Enzymes:  Recent Labs  05/15/16 1948 05/16/16 0130 05/16/16 0755  TROPONINI 1.30* 1.11* 1.30*   BNP: No  results for input(s): PROBNP in the last 72 hours. D-Dimer: No results for input(s): DDIMER in the last 72 hours. Hemoglobin A1C: No results for input(s): HGBA1C in the last 72 hours. Fasting Lipid Panel: No results for input(s): CHOL, HDL, LDLCALC, TRIG, CHOLHDL, LDLDIRECT in the last 72 hours. Thyroid Function Tests: No results for input(s): TSH, T4TOTAL, T3FREE, THYROIDAB in the last 72 hours.  Invalid input(s): FREET3   Radiology/Studies:  Ct Head Wo Contrast  05/15/2016  CLINICAL DATA:  Lethargy beginning this morning. EXAM: CT HEAD WITHOUT CONTRAST TECHNIQUE: Contiguous axial images were obtained from the base of the skull through the vertex without intravenous contrast. COMPARISON:  None. FINDINGS: The brain shows advanced generalized atrophy. There chronic small-vessel ischemic changes throughout the deep and subcortical white matter. Old lacunar infarction right basal ganglia. No sign of acute infarction, mass lesion, hemorrhage, hydrocephalus or extra-axial collection. Sinuses, middle ears and mastoids are clear. No calvarial abnormality. There is atherosclerotic calcification of the major vessels at the base of the brain. IMPRESSION: No acute finding.  Advanced atrophy and chronic ischemic changes. Electronically Signed   By: Elta [place]  Shogry M.D.   On: 05/15/2016 10:36   Dg Chest Port 1 View  05/15/2016  CLINICAL DATA:  Lethargy.  Chronic renal disease. EXAM: PORTABLE CHEST 1 VIEW COMPARISON:  PA and lateral chest 05/12/2016. Single view of the chest 12/08/2014. FINDINGS: There is marked cardiomegaly with pulmonary vascular congestion. The patient has new small to moderate bilateral pleural effusions with basilar airspace disease, likely atelectasis. No pneumothorax. IMPRESSION: New small to moderate bilateral pleural effusions and basilar atelectasis since the most recent exam. Cardiomegaly and pulmonary vascular congestion. Electronically Signed   By: Inge Rise M.D.   On:  05/15/2016 10:31    PHYSICAL EXAM General: Well nourished AA male, NAD Psych: normal Neuro: Alert and oriented X 3, Moves all extremities spontaneously. HEENT: Normal Neck: Supple, + JVD. Lungs: Resp regular and unlabored, CTA. Heart: RRR no s3, s4, 2/6 systolic murmur. Abdomen: Soft, non-tender, non-distended, BS + x 4.  Extremities: No clubbing, cyanosis or edema. DP/PT/Radials 2+ and equal bilaterally.    ASSESSMENT AND PLAN: 1. Acute on Chronic combined CHF: Last Echo in 2015 showed an EF of 55-60 %, when he was last admitted for acute on chronic combined CHF. Repeat this admission shows decrease to 25-30% with diffuse hypokinesis, with PA pressure of 80mmHg.   -- Trop with flat trend 0.25>0.23>0.28>1.25>1.3>1.11>1.3, likely related to acute renal failure and uncontrolled hypertension. Not sure why we are still checking troponins. Recommend  outpatient myoview after he is past this "acute" state.  -- continue coreg, hydralazine/nitrate combination. -- repeat Echo in 3 months.  2. Atrial fibrillation/flutter:  Appears to be a new finding this admission. Rate controlled at this time. On beta blocker.  -- This patients CHA2DS2-VASc Score and unadjusted Ischemic Stroke Rate (% per year) is equal to 2.2 % stroke rate/year from a score of 2 Above score calculated as 1 point each if present [CHF, HTN, DM, Vascular=MI/PAD/Aortic Plaque, Age if 65-74, or Male] Above score calculated as 2 points each if present [Age > 75, or Stroke/TIA/TE] -- He reports drinking at least a 5th of liquor a day, with no plans for stopping at this time. Given this and his hx of noncompliance, would not consider him a good candidate for anticoagulation at this time. With renal failure he is not a candidate for a NOAC. If he demonstrates good follow up and Etoh abstinence we could reconsider coumadin.  3. CKD stage 5: Admission Cr 13, nephrology following. On hemodialysis. AV fistula placed  yesterday. -- Not a candidate for ACE/ARB therapy given renal failure  4. HTN: BP now in excellent control. Continue Coreg and Bidil.  5. Anemia  6. Abnormal LFTs  7. S/p AV fistula.  8. Etoh abuse  Nothing further to add from a cardiac perspective. Recommend follow up with our office post discharge. If he is compliant with follow up we can arrange stress test at that time. We will sign off now.  Present on Admission:  . Renal failure . CKD (chronic kidney disease), stage IV ([place]) . Essential hypertension . Systolic and diastolic CHF, acute on chronic ([place]) . Alcohol abuse  Signed, Jorma Tassinari [name], [place] 05/16/2016 2:25 PM

## 2016-05-16 NOTE — Progress Notes (Signed)
Patient ID: Kenneth [name], male   DOB: May 18, 1956, 60 y.o.   MRN: [id number] S:No new complaints, a little better but still sluggish O:BP 111/77 mmHg  Pulse 100  Temp(Src) 97.6 F (36.4 C) (Oral)  Resp 18  Ht 5\' 10"  (1.778 m)  Wt 85.5 kg (188 lb 7.9 oz)  BMI 27.05 kg/m2  SpO2 100%  Intake/Output Summary (Last 24 hours) at 05/16/16 0936 Last data filed at 05/16/16 0507  Gross per 24 hour  Intake    120 ml  Output   1000 ml  Net   -880 ml   Intake/Output: I/O last 3 completed shifts: In: 420.5 [P.O.:360; I.V.:60.5] Out: 1000 [Other:1000]  Intake/Output this shift:    Weight change: 0.4 kg (14.1 oz) Gen:WD WN AAM in NAD CVS:no rub Resp:cta Abd:+BS. Soft, NT Ext:no edema, L AVF +T/B   Recent Labs Lab 05/12/16 0736 05/12/16 1238 05/12/16 1442 05/13/16 0159 05/13/16 1610 05/14/16 0552 05/15/16 0650 05/16/16 0130  NA 133*  --   --  132* 135 133* 134* 138  K 4.7  --   --  4.8 5.2* 3.9 3.8 4.1  CL 105  --   --  107 106 104 104 106  CO2 13*  --   --  12* 14* 15* 20* 24  GLUCOSE 87  --   --  100* 105* 92 101* 108*  BUN 120*  --   --  122* 124* 92* 57* 29*  CREATININE 13.03* 13.01*  --  13.29* 13.30* 11.04* 8.83* 6.43*  ALBUMIN 4.0  --   --  3.1* 3.2*  --  2.7* 2.9*  CALCIUM 8.0*  --   --  7.6* 8.1* 7.4* 7.2* 7.6*  PHOS  --   --  8.0*  --  9.1*  --  4.3 2.6  AST 73*  --   --  237*  --   --   --   --   ALT 79*  --   --  161*  --   --   --   --    Liver Function Tests:  Recent Labs Lab 05/12/16 0736 05/13/16 0159 05/13/16 1610 05/15/16 0650 05/16/16 0130  AST 73* 237*  --   --   --   ALT 79* 161*  --   --   --   ALKPHOS 59 102  --   --   --   BILITOT 1.1 1.1  --   --   --   PROT 8.5* 7.4  --   --   --   ALBUMIN 4.0 3.1* 3.2* 2.7* 2.9*   No results for input(s): LIPASE, AMYLASE in the last 168 hours. No results for input(s): AMMONIA in the last 168 hours. CBC:  Recent Labs Lab 05/12/16 0736  05/13/16 0159 05/13/16 1611 05/14/16 0552 05/15/16 0650  05/16/16 0130  WBC 5.1  < > 5.3 5.5 6.4 6.6 6.6  NEUTROABS 3.7  --   --   --   --   --   --   HGB 9.7*  < > 9.5* 10.1* 9.4* 8.1* 8.2*  HCT 27.7*  < > 28.3* 30.2* 27.3* 24.1* 25.5*  MCV 87.4  < > 89.0 88.8 88.1 90.3 92.4  PLT 185  < > 181 251 237 207 198  < > = values in this interval not displayed. Cardiac Enzymes:  Recent Labs Lab 05/13/16 0159 05/15/16 1047 05/15/16 1948 05/16/16 0130 05/16/16 0755  TROPONINI 0.28* 1.25* 1.30* 1.11* 1.30*   CBG:  Recent  Labs Lab 05/14/16 1707 05/14/16 1747 05/15/16 0943  GLUCAP 121* 112* 134*    Iron Studies: No results for input(s): IRON, TIBC, TRANSFERRIN, FERRITIN in the last 72 hours. Studies/Results: Ct Head Wo Contrast  05/15/2016  CLINICAL DATA:  Lethargy beginning this morning. EXAM: CT HEAD WITHOUT CONTRAST TECHNIQUE: Contiguous axial images were obtained from the base of the skull through the vertex without intravenous contrast. COMPARISON:  None. FINDINGS: The brain shows advanced generalized atrophy. There chronic small-vessel ischemic changes throughout the deep and subcortical white matter. Old lacunar infarction right basal ganglia. No sign of acute infarction, mass lesion, hemorrhage, hydrocephalus or extra-axial collection. Sinuses, middle ears and mastoids are clear. No calvarial abnormality. There is atherosclerotic calcification of the major vessels at the base of the brain. IMPRESSION: No acute finding.  Advanced atrophy and chronic ischemic changes. Electronically Signed   By: Nelson Chimes M.D.   On: 05/15/2016 10:36   Dg Chest Port 1 View  05/15/2016  CLINICAL DATA:  Lethargy.  Chronic renal disease. EXAM: PORTABLE CHEST 1 VIEW COMPARISON:  PA and lateral chest 05/12/2016. Single view of the chest 12/08/2014. FINDINGS: There is marked cardiomegaly with pulmonary vascular congestion. The patient has new small to moderate bilateral pleural effusions with basilar airspace disease, likely atelectasis. No pneumothorax.  IMPRESSION: New small to moderate bilateral pleural effusions and basilar atelectasis since the most recent exam. Cardiomegaly and pulmonary vascular congestion. Electronically Signed   By: Inge Rise M.D.   On: 05/15/2016 10:31   . carvedilol  6.25 mg Oral BID WC  . folic acid  1 mg Oral Daily  . heparin  5,000 Units Subcutaneous Q8H  . isosorbide-hydrALAZINE  1 tablet Oral TID  . multivitamin with minerals  1 tablet Oral Daily  . sodium chloride flush  3 mL Intravenous Q12H  . thiamine  100 mg Oral Daily    BMET    Component Value Date/Time   NA 138 05/16/2016 0130   K 4.1 05/16/2016 0130   CL 106 05/16/2016 0130   CO2 24 05/16/2016 0130   GLUCOSE 108* 05/16/2016 0130   BUN 29* 05/16/2016 0130   CREATININE 6.43* 05/16/2016 0130   CALCIUM 7.6* 05/16/2016 0130   GFRNONAA 8* 05/16/2016 0130   GFRAA 10* 05/16/2016 0130   CBC    Component Value Date/Time   WBC 6.6 05/16/2016 0130   RBC 2.76* 05/16/2016 0130   HGB 8.2* 05/16/2016 0130   HCT 25.5* 05/16/2016 0130   HCT 29.0* 05/12/2016 1442   PLT 198 05/16/2016 0130   MCV 92.4 05/16/2016 0130   MCH 29.7 05/16/2016 0130   MCHC 32.2 05/16/2016 0130   RDW 17.8* 05/16/2016 0130   LYMPHSABS 0.8 05/12/2016 0736   MONOABS 0.3 05/12/2016 0736   EOSABS 0.2 05/12/2016 0736   BASOSABS 0.0 05/12/2016 0736    Assessment/Plan: 1. Delirium- acute onset 05/15/16. CT scan negative and cbg 113. Unclear etiology. Dr. Doyle Askew aware and began workup. Doubt dialysis dysequilibrium as he has had only 2 short sessions of HD.  A little better today but definitely more slow and deliberate in speech, neuro exam nonfocal.  Consider MRI.  Continue to follow.  Possibly related to withdrawal meds.  On thiamine. Cont to follow. 2. Progressive CKD now stage 5 due to longstanding, poorly controlled HTN as well as NSAIDs. I discussed the severity and chronicity of his kidney disease and discussed the need to initiate HD, however I also stressed the  fact that this is an irreversible process  and he will require ongoing hemodialysis indefinitely. I also stressed the importance of compliance with dialysis and the increased risk of hospitalization and/or sudden death for non adherence of maintenance dialysis. He has agreed to proceed; "I have to if I don't want to die".  1. Appreciate VVS assistance  2. CLIP process started. 3. S/p 3rd treatment 05/15/16 and will start TTS schedule as will likely be his outpatient schedule. 3. Anemia of chronic disease- will check iron stores and initiate ESA,  1. SPEP revealed small M spike, /UPEP 2. aranesp 25mcg q week 3. Follow iron stores 4. CKD-MBD- low phos, no binders, iPTH 305 at goal. Will start low dose vit D.  5. Vascular access- s/p LBVT 05/14/16 by Dr. Donnetta Hutching as well as HD cath 6. HTN, malignant- hypertensive heart disease and CKD. Resume outpatient meds and avoid NSAIDs/cox-II I's 7. Metabolic acidosis- will plan for HD tomorrow after HD cath placement to correct metabolic derangements. 8. Abnormal lft's- ?related to etoh. hepatitis panel negative 9. H/o Etoh abuse- was on withdrawal protocol. 10. Elevated troponin- per primary svc. 11. Disposition- will need outpatient HD to be arranged   Donetta Potts

## 2016-05-16 NOTE — Progress Notes (Signed)
PROGRESS NOTE  Kenneth [name] [name] DOB: 17-Jun-1956 DOA: 05/12/2016   PCP: [name], NP   LOS: 4 days   Brief Narrative: 60 y.o. male with history of hypertension, hyperlipidemia, combined congestive heart failure, ejection fraction 30-35% with grade 3 diastolic dysfunction, chronic kidney disease stage 3-4, medical noncompliance and alcohol abuse presented to [facility] ED due to c/o upper back pain for the last two weeks. He was found to be in [facility] renal failure and transferred to [facility] for renal consult and HD initiation.   Assessment & Plan:  Acute encephalopathy 7/20 - still somewhat somnolent but easy to awake and following most of the commands  - pt currently stable, VSS, protecting airways  - CT head with no evidence of stroke - monitor closely   AKI on CKD, stage V - Progressive CKD now stage 5 due to longstanding, poorly controlled HTN as well as NSAIDs - nephrology following, appreciate assistance  - VVS consulted for placement of HD cath and AVF/AVG, appreciate assistance   A fib / A flutter with variable conduction - patient's CHA2DS2-VASc Score for Stroke Risk is 4.  - Cardiology consulted - troponins remain elevated, defer to cardiology   Chest pain  - patient reports intermittent chest pain over the past month especially with exertion - cardiology consulted, appreciate assistance  - no chest pain this AM  Acute on chronic combined systolic and diastolic CHF with new pulmonary hypertension - Last Echo in 2015 showed an EF of 55-60 %, when he was last admitted for acute on chronic combined CHF.  - Repeat this admission shows decrease to 25-30% with diffuse hypokinesis, with PA pressure of 37mmHg.  - on coreg, isosorbide - hydralazine   Alcohol abuse - elevated LFTs c/w ETOH - keep on CIWA  Anemia of Chronic disease, CKD - no signs of bleeding  - CBC in AM  Hypertensive urgency not present on admission  - on coreg, isosorbide - hydralazine  - SBP in 100's  this AM - continue same regimen for now   DVT prophylaxis: heparin Code Status: Full Family Communication: no family bedside Disposition Plan: remain inpatient, home once cleared by consultants   Consultants:   Nephrology   Cardiology  Vascular surgery   Procedures:   2D echo:-When compared to the prior study from 12/09/2013 LVEF has decreased, previously 60-65%, now 25-30% with diffuse hypokinesis. There is also a moderate RV systolic dysfunction. Severe pulmonary hypertension is new.  Antimicrobials:  None    Subjective: Flat affect, still somnolent but easy to awake.  Objective: Filed Vitals:   05/15/16 2138 05/16/16 0507 05/16/16 0944 05/16/16 1818  BP: 139/75 111/77 114/69 128/75  Pulse: 98 100 88 84  Temp: 98.3 F (36.8 C) 97.6 F (36.4 C) 97.6 F (36.4 C) 98.4 F (36.9 C)  TempSrc: Oral Oral Oral Oral  Resp: 19 18 18 18   Height:      Weight: 85.5 kg (188 lb 7.9 oz)     SpO2: 98% 100% 97% 99%    Intake/Output Summary (Last 24 hours) at 05/16/16 1831 Last data filed at 05/16/16 0900  Gross per 24 hour  Intake    360 ml  Output      0 ml  Net    360 ml   Filed Weights   05/15/16 1116 05/15/16 1449 05/15/16 2138  Weight: 85 kg (187 lb 6.3 oz) 84 kg (185 lb 3 oz) 85.5 kg (188 lb 7.9 oz)   Examination:  Filed Vitals:   05/15/16  2138 05/16/16 0507 05/16/16 0944 05/16/16 1818  BP: 139/75 111/77 114/69 128/75  Pulse: 98 100 88 84  Temp: 98.3 F (36.8 C) 97.6 F (36.4 C) 97.6 F (36.4 C) 98.4 F (36.9 C)  TempSrc: Oral Oral Oral Oral  Resp: 19 18 18 18   Height:      Weight: 85.5 kg (188 lb 7.9 oz)     SpO2: 98% 100% 97% 99%   Eyes: PERRL ENMT: Mucous membranes are moist.  Respiratory: bibasial crackles, no wheezing. Cardiovascular: irregular, tachycardic. Regular rate and rhythm, no murmurs / rubs / gallops. Abdomen: no tenderness. Bowel sounds positive.  Neurologic: moving all 4 extremities spontaneously  Data Reviewed: I have personally  reviewed following labs and imaging studies  CBC:  Recent Labs Lab 05/12/16 0736  05/13/16 0159 05/13/16 1611 05/14/16 0552 05/15/16 0650 05/16/16 0130  WBC 5.1  < > 5.3 5.5 6.4 6.6 6.6  NEUTROABS 3.7  --   --   --   --   --   --   HGB 9.7*  < > 9.5* 10.1* 9.4* 8.1* 8.2*  HCT 27.7*  < > 28.3* 30.2* 27.3* 24.1* 25.5*  MCV 87.4  < > 89.0 88.8 88.1 90.3 92.4  PLT 185  < > 181 251 237 207 198  < > = values in this interval not displayed. Basic Metabolic Panel:  Recent Labs Lab 05/12/16 1442 05/13/16 0159 05/13/16 1610 05/14/16 0552 05/15/16 0650 05/16/16 0130  NA  --  132* 135 133* 134* 138  K  --  4.8 5.2* 3.9 3.8 4.1  CL  --  107 106 104 104 106  CO2  --  12* 14* 15* 20* 24  GLUCOSE  --  100* 105* 92 101* 108*  BUN  --  122* 124* 92* 57* 29*  CREATININE  --  13.29* 13.30* 11.04* 8.83* 6.43*  CALCIUM  --  7.6* 8.1* 7.4* 7.2* 7.6*  PHOS 8.0*  --  9.1*  --  4.3 2.6   Liver Function Tests:  Recent Labs Lab 05/12/16 0736 05/13/16 0159 05/13/16 1610 05/15/16 0650 05/16/16 0130  AST 73* 237*  --   --   --   ALT 79* 161*  --   --   --   ALKPHOS 59 102  --   --   --   BILITOT 1.1 1.1  --   --   --   PROT 8.5* 7.4  --   --   --   ALBUMIN 4.0 3.1* 3.2* 2.7* 2.9*   Coagulation Profile:  Recent Labs Lab 05/13/16 0503  INR 1.51*   Cardiac Enzymes:  Recent Labs Lab 05/13/16 0159 05/15/16 1047 05/15/16 1948 05/16/16 0130 05/16/16 0755  TROPONINI 0.28* 1.25* 1.30* 1.11* 1.30*   Thyroid Function Tests: No results for input(s): TSH, T4TOTAL, FREET4, T3FREE, THYROIDAB in the last 72 hours. Anemia Panel: No results for input(s): VITAMINB12, FOLATE, FERRITIN, TIBC, IRON, RETICCTPCT in the last 72 hours. Urine analysis:    Component Value Date/Time   COLORURINE YELLOW 05/12/2016 0834   APPEARANCEUR CLOUDY* 05/12/2016 0834   LABSPEC 1.014 05/12/2016 0834   PHURINE 5.0 05/12/2016 0834   GLUCOSEU NEGATIVE 05/12/2016 0834   HGBUR MODERATE* 05/12/2016 0834    BILIRUBINUR NEGATIVE 05/12/2016 0834   KETONESUR NEGATIVE 05/12/2016 0834   PROTEINUR 100* 05/12/2016 0834   UROBILINOGEN 0.2 12/09/2013 0013   NITRITE NEGATIVE 05/12/2016 0834   LEUKOCYTESUR NEGATIVE 05/12/2016 0834   Recent Results (from the past 240 hour(s))  Urine culture  Status: Abnormal   Collection Time: 05/12/16  8:24 AM  Result Value Ref Range Status   Specimen Description URINE, CLEAN CATCH  Final   Special Requests NONE  Final   Culture (A)  Final    7,000 COLONIES/mL INSIGNIFICANT GROWTH Performed at [facility]    Report Status 05/13/2016 FINAL  Final    Radiology Studies: Ct Head Wo Contrast  05/15/2016  CLINICAL DATA:  Lethargy beginning this morning. EXAM: CT HEAD WITHOUT CONTRAST TECHNIQUE: Contiguous axial images were obtained from the base of the skull through the vertex without intravenous contrast. COMPARISON:  None. FINDINGS: The brain shows advanced generalized atrophy. There chronic small-vessel ischemic changes throughout the deep and subcortical white matter. Old lacunar infarction right basal ganglia. No sign of acute infarction, mass lesion, hemorrhage, hydrocephalus or extra-axial collection. Sinuses, middle ears and mastoids are clear. No calvarial abnormality. There is atherosclerotic calcification of the major vessels at the base of the brain. IMPRESSION: No acute finding.  Advanced atrophy and chronic ischemic changes. Electronically Signed   By: Nelson Chimes M.D.   On: 05/15/2016 10:36   Dg Chest Port 1 View  05/15/2016  CLINICAL DATA:  Lethargy.  Chronic renal disease. EXAM: PORTABLE CHEST 1 VIEW COMPARISON:  PA and lateral chest 05/12/2016. Single view of the chest 12/08/2014. FINDINGS: There is marked cardiomegaly with pulmonary vascular congestion. The patient has new small to moderate bilateral pleural effusions with basilar airspace disease, likely atelectasis. No pneumothorax. IMPRESSION: New small to moderate bilateral pleural effusions  and basilar atelectasis since the most recent exam. Cardiomegaly and pulmonary vascular congestion. Electronically Signed   By: Inge Rise M.D.   On: 05/15/2016 10:31    Scheduled Meds: . carvedilol  6.25 mg Oral BID WC  . [START ON 05/17/2016] darbepoetin (ARANESP) injection - DIALYSIS  60 mcg Intravenous Q Sat-HD  . [START ON 05/17/2016] doxercalciferol  2 mcg Intravenous Q T,Th,Sa-HD  . folic acid  1 mg Oral Daily  . heparin  5,000 Units Subcutaneous Q8H  . isosorbide-hydrALAZINE  1 tablet Oral TID  . multivitamin with minerals  1 tablet Oral Daily  . sodium chloride flush  3 mL Intravenous Q12H  . thiamine  100 mg Oral Daily   Continuous Infusions: . sodium chloride 10 mL/hr at 05/14/16 1157   Faye Ramsay, MD  [facility] Pager [phone]  If 7PM-7AM, please contact night-coverage [email or web address] Password [email or web address]   If 7PM-7AM, please contact night-coverage [email or web address] Password [facility] 05/16/2016, 6:31 PM

## 2016-05-17 LAB — RENAL FUNCTION PANEL
ANION GAP: 9 (ref 5–15)
Albumin: 2.8 g/dL — ABNORMAL LOW (ref 3.5–5.0)
BUN: 43 mg/dL — AB (ref 6–20)
CALCIUM: 8 mg/dL — AB (ref 8.9–10.3)
CO2: 25 mmol/L (ref 22–32)
Chloride: 103 mmol/L (ref 101–111)
Creatinine, Ser: 8.94 mg/dL — ABNORMAL HIGH (ref 0.61–1.24)
GFR calc Af Amer: 6 mL/min — ABNORMAL LOW (ref >60)
GFR calc non Af Amer: 6 mL/min — ABNORMAL LOW (ref >60)
GLUCOSE: 98 mg/dL (ref 65–99)
Phosphorus: 4 mg/dL (ref 2.5–4.6)
Potassium: 4.1 mmol/L (ref 3.5–5.1)
SODIUM: 137 mmol/L (ref 135–145)

## 2016-05-17 LAB — CBC
HCT: 25.4 % — ABNORMAL LOW (ref 39.0–52.0)
HEMOGLOBIN: 8.1 g/dL — AB (ref 13.0–17.0)
MCH: 29.9 pg (ref 26.0–34.0)
MCHC: 31.9 g/dL (ref 30.0–36.0)
MCV: 93.7 fL (ref 78.0–100.0)
Platelets: 198 10*3/uL (ref 150–400)
RBC: 2.71 MIL/uL — ABNORMAL LOW (ref 4.22–5.81)
RDW: 18 % — AB (ref 11.5–15.5)
WBC: 5.5 10*3/uL (ref 4.0–10.5)

## 2016-05-17 MED ORDER — DOXERCALCIFEROL 4 MCG/2ML IV SOLN
INTRAVENOUS | Status: AC
  Filled 2016-05-17: qty 2

## 2016-05-17 MED ORDER — DARBEPOETIN ALFA 60 MCG/0.3ML IJ SOSY
PREFILLED_SYRINGE | INTRAMUSCULAR | Status: AC
  Filled 2016-05-17: qty 0.3

## 2016-05-17 NOTE — Progress Notes (Signed)
PROGRESS NOTE  Kenneth [name] [name] DOB: [birth year] DOA: 05/12/2016   PCP: [name], NP   LOS: 5 days   Brief Narrative: 60 y.o. male with history of hypertension, hyperlipidemia, combined congestive heart failure, ejection fraction 30-35% with grade 3 diastolic dysfunction, chronic kidney disease stage 3-4, medical noncompliance and alcohol abuse presented to [facility] ED due to c/o upper back pain for the last two weeks. He was found to be in [facility] renal failure and transferred to [facility] for renal consult and HD initiation.   Assessment & Plan:  Acute encephalopathy 7/20 - still somewhat somnolent but easy to awake and following most of the commands but very slow and needs lots of assistance  - pt currently stable, VSS, protecting airways  - CT head with no evidence of stroke - monitor closely   AKI on CKD, stage V - Progressive CKD now stage 5 due to longstanding, poorly controlled HTN as well as NSAIDs - nephrology following, appreciate assistance  - VVS consulted for placement of HD cath and AVF/AVG, appreciate assistance  - Initiated on hemodialysis and now status post placement of left basilic vein transposition fistula on 05/14/16 as we use right IJ TDC for dialysis  - Awaiting outpatient dialysis unit placement  A fib / A flutter with variable conduction - patient's CHA2DS2-VASc Score for Stroke Risk is 4.  - Cardiology consulted, signed off as no further recommendations, no indications for any interventions  - troponins remain elevated, defer to cardiology   Chest pain  - patient reports intermittent chest pain over the past month especially with exertion - cardiology consulted, appreciate assistance  - no chest pain this AM  Acute on chronic combined systolic and diastolic CHF with new pulmonary hypertension - Last Echo in 2015 showed an EF of 55-60 %, when he was last admitted for acute on chronic combined CHF.  - Repeat this admission shows decrease to 25-30% with  diffuse hypokinesis, with PA pressure of 55mmHg.  - on coreg, isosorbide - hydralazine   Alcohol abuse - elevated LFTs c/w ETOH - keep on CIWA  Anemia of Chronic disease, CKD - no signs of bleeding  - CBC in AM  Hypertensive urgency not present on admission  - on coreg, isosorbide - hydralazine  - SBP in 100's this AM - continue same regimen for now   DVT prophylaxis: heparin Code Status: Full Family Communication: no family bedside Disposition Plan: remain inpatient, home once cleared by consultants   Consultants:   Nephrology   Cardiology  Vascular surgery   Procedures:   2D echo:-When compared to the prior study from 12/09/2013 LVEF has decreased, previously 60-65%, now 25-30% with diffuse hypokinesis. There is also a moderate RV systolic dysfunction. Severe pulmonary hypertension is new.  Antimicrobials:  None    Subjective: Flat affect, still somnolent but easy to awake.  Objective: Filed Vitals:   05/17/16 1000 05/17/16 1030 05/17/16 1039 05/17/16 1129  BP: 160/88 147/100 156/98 131/82  Pulse: 95 88 87 98  Temp:   98 F (36.7 C) 98.3 F (36.8 C)  TempSrc:   Oral Oral  Resp: 16 18 18 17   Height:      Weight:   84.2 kg (185 lb 10 oz)   SpO2:   100% 97%    Intake/Output Summary (Last 24 hours) at 05/17/16 1215 Last data filed at 05/17/16 1115  Gross per 24 hour  Intake    720 ml  Output   1650 ml  Net   -930  ml   Filed Weights   05/15/16 2138 05/17/16 0655 05/17/16 1039  Weight: 85.5 kg (188 lb 7.9 oz) 85.7 kg (188 lb 15 oz) 84.2 kg (185 lb 10 oz)   Examination:  Filed Vitals:   05/17/16 1000 05/17/16 1030 05/17/16 1039 05/17/16 1129  BP: 160/88 147/100 156/98 131/82  Pulse: 95 88 87 98  Temp:   98 F (36.7 C) 98.3 F (36.8 C)  TempSrc:   Oral Oral  Resp: 16 18 18 17   Height:      Weight:   84.2 kg (185 lb 10 oz)   SpO2:   100% 97%   Eyes: PERRL ENMT: Mucous membranes are moist.  Respiratory: bibasial crackles, no  wheezing. Cardiovascular: irregular, tachycardic. Regular rate and rhythm, no murmurs / rubs / gallops. Abdomen: no tenderness. Bowel sounds positive.  Neurologic: moving all 4 extremities spontaneously  Data Reviewed: I have personally reviewed following labs and imaging studies  CBC:  Recent Labs Lab 05/12/16 0736  05/13/16 1611 05/14/16 0552 05/15/16 0650 05/16/16 0130 05/17/16 0729  WBC 5.1  < > 5.5 6.4 6.6 6.6 5.5  NEUTROABS 3.7  --   --   --   --   --   --   HGB 9.7*  < > 10.1* 9.4* 8.1* 8.2* 8.1*  HCT 27.7*  < > 30.2* 27.3* 24.1* 25.5* 25.4*  MCV 87.4  < > 88.8 88.1 90.3 92.4 93.7  PLT 185  < > 251 237 207 198 198  < > = values in this interval not displayed. Basic Metabolic Panel:  Recent Labs Lab 05/12/16 1442  05/13/16 1610 05/14/16 0552 05/15/16 0650 05/16/16 0130 05/17/16 0729  NA  --   < > 135 133* 134* 138 137  K  --   < > 5.2* 3.9 3.8 4.1 4.1  CL  --   < > 106 104 104 106 103  CO2  --   < > 14* 15* 20* 24 25  GLUCOSE  --   < > 105* 92 101* 108* 98  BUN  --   < > 124* 92* 57* 29* 43*  CREATININE  --   < > 13.30* 11.04* 8.83* 6.43* 8.94*  CALCIUM  --   < > 8.1* 7.4* 7.2* 7.6* 8.0*  PHOS 8.0*  --  9.1*  --  4.3 2.6 4.0  < > = values in this interval not displayed. Liver Function Tests:  Recent Labs Lab 05/12/16 0736 05/13/16 0159 05/13/16 1610 05/15/16 0650 05/16/16 0130 05/17/16 0729  AST 73* 237*  --   --   --   --   ALT 79* 161*  --   --   --   --   ALKPHOS 59 102  --   --   --   --   BILITOT 1.1 1.1  --   --   --   --   PROT 8.5* 7.4  --   --   --   --   ALBUMIN 4.0 3.1* 3.2* 2.7* 2.9* 2.8*   Coagulation Profile:  Recent Labs Lab 05/13/16 0503  INR 1.51*   Cardiac Enzymes:  Recent Labs Lab 05/13/16 0159 05/15/16 1047 05/15/16 1948 05/16/16 0130 05/16/16 0755  TROPONINI 0.28* 1.25* 1.30* 1.11* 1.30*   Urine analysis:    Component Value Date/Time   COLORURINE YELLOW 05/12/2016 0834   APPEARANCEUR CLOUDY* 05/12/2016 0834    LABSPEC 1.014 05/12/2016 [place] 5.0 05/12/2016 [place] 05/12/2016 [id number]  HGBUR MODERATE* 05/12/2016 [place] NEGATIVE 05/12/2016 [place] 05/12/2016 0834   PROTEINUR 100* 05/12/2016 0834   UROBILINOGEN 0.2 12/09/2013 0013   NITRITE NEGATIVE 05/12/2016 0834   LEUKOCYTESUR NEGATIVE 05/12/2016 0834   Recent Results (from the past 240 hour(s))  Urine culture     Status: Abnormal   Collection Time: 05/12/16  8:24 AM  Result Value Ref Range Status   Specimen Description URINE, CLEAN CATCH  Final   Special Requests NONE  Final   Culture (A)  Final    7,000 COLONIES/mL INSIGNIFICANT GROWTH Performed at [facility]    Report Status 05/13/2016 FINAL  Final    Radiology Studies: No results found.  Scheduled Meds: . carvedilol  6.25 mg Oral BID WC  . darbepoetin (ARANESP) injection - DIALYSIS  60 mcg Intravenous Q Sat-HD  . doxercalciferol  2 mcg Intravenous Q T,Th,Sa-HD  . folic acid  1 mg Oral Daily  . heparin  5,000 Units Subcutaneous Q8H  . isosorbide-hydrALAZINE  1 tablet Oral TID  . multivitamin with minerals  1 tablet Oral Daily  . sodium chloride flush  3 mL Intravenous Q12H  . thiamine  100 mg Oral Daily   Continuous Infusions: . sodium chloride 10 mL/hr at 05/14/16 1157   Faye Ramsay, MD  [facility] Pager [phone]  If 7PM-7AM, please contact night-coverage [email or web address] Password [email or web address]  If 7PM-7AM, please contact night-coverage [email or web address] Password [email or web address] 05/17/2016, 12:15 PM

## 2016-05-17 NOTE — Procedures (Signed)
Patient seen on Hemodialysis. QB 400, UF goal 1.5 Treatment adjusted as needed.  Elmarie Shiley MD [facility]. Office # [phone] Pager # [phone] 10:58 AM

## 2016-05-17 NOTE — Progress Notes (Signed)
   05/17/16 2100  Clinical Encounter Type  Visited With Patient  Visit Type Spiritual support  Referral From Nurse  Consult/Referral To Chaplain  Spiritual Encounters  Spiritual Needs Sacred text;Prayer  Stress Factors  Patient Stress Factors Exhausted;Health changes;Lack of knowledge;Loss of control  Chaplain responded to page for spiritual support, provided spiritual presence, scripture reading and prayer, advised that Chaplain will be available for further support as needed.

## 2016-05-17 NOTE — Progress Notes (Signed)
Patient ID: Kenneth [name], male   DOB: [birth year], 60 y.o.   MRN: [id number] [facility] Progress Note   Assessment/ Plan:   1. Acute delirium: Suspected to be related to alcohol withdrawal versus medication induced. 2. ESRD: With progression of chronic kidney disease stage V from underlying hypertension/NSAIDs now to ESRD. Initiated on hemodialysis and now status post placement of left basilic vein transposition fistula on 05/14/16 as we use right IJ [facility] for dialysis. Awaiting outpatient dialysis unit placement. 3. Anemia: Without overt loss-SPEP concerning for small M spike. Started on ESA with acceptable iron stores. 4. CKD-MBD: Low phosphorus-likely secondary to chronic malnutrition/poor intake with alcoholism. On low-dose vitamin D receptor analogue. 5. Nutrition: Chronic malnutrition secondary to alcoholism/impaired caloric intake. 6. Hypertension: Blood pressure elevated, anticipate to improve with ultrafiltration/hemodialysis.  Subjective:   Denies any complaints-appears to be very slow to understand and respond to questions.    Objective:   BP 156/98 mmHg  Pulse 87  Temp(Src) 98 F (36.7 C) (Oral)  Resp 18  Ht 5\' 10"  (1.778 m)  Wt 84.2 kg (185 lb 10 oz)  BMI 26.63 kg/m2  SpO2 100%  Physical Exam: [id number] somewhat lethargic, requiring a lot of assistance reaching for his television remote and phone CVS: Pulse regular rhythm, S1 and S2 normal Resp: Clear to auscultation, no rales Abd: Soft, obese, nontender Ext: No lower extremity edema. Left BVT with thrill and bruit  Labs: BMET  Recent Labs Lab 05/12/16 0736 05/12/16 1238 05/12/16 1442 05/13/16 0159 05/13/16 1610 05/14/16 0552 05/15/16 0650 05/16/16 0130 05/17/16 0729  NA 133*  --   --  132* 135 133* 134* 138 137  K 4.7  --   --  4.8 5.2* 3.9 3.8 4.1 4.1  CL 105  --   --  107 106 104 104 106 103  CO2 13*  --   --  12* 14* 15* 20* 24 25  GLUCOSE 87  --   --  100* 105* 92 101* 108* 98  BUN  120*  --   --  122* 124* 92* 57* 29* 43*  CREATININE 13.03* 13.01*  --  13.29* 13.30* 11.04* 8.83* 6.43* 8.94*  CALCIUM 8.0*  --   --  7.6* 8.1* 7.4* 7.2* 7.6* 8.0*  PHOS  --   --  8.0*  --  9.1*  --  4.3 2.6 4.0   CBC  Recent Labs Lab 05/12/16 0736  05/14/16 0552 05/15/16 0650 05/16/16 0130 05/17/16 0729  WBC 5.1  < > 6.4 6.6 6.6 5.5  NEUTROABS 3.7  --   --   --   --   --   HGB 9.7*  < > 9.4* 8.1* 8.2* 8.1*  HCT 27.7*  < > 27.3* 24.1* 25.5* 25.4*  MCV 87.4  < > 88.1 90.3 92.4 93.7  PLT 185  < > 237 207 198 198  < > = values in this interval not displayed.  Medications:    . carvedilol  6.25 mg Oral BID WC  . darbepoetin (ARANESP) injection - DIALYSIS  60 mcg Intravenous Q Sat-HD  . doxercalciferol  2 mcg Intravenous Q T,Th,Sa-HD  . folic acid  1 mg Oral Daily  . heparin  5,000 Units Subcutaneous Q8H  . isosorbide-hydrALAZINE  1 tablet Oral TID  . multivitamin with minerals  1 tablet Oral Daily  . sodium chloride flush  3 mL Intravenous Q12H  . thiamine  100 mg Oral Daily   Elmarie Shiley, MD 05/17/2016, 10:50 AM

## 2016-05-17 NOTE — Progress Notes (Signed)
Patient refused labs this am. Will draw labs in HD.

## 2016-05-17 NOTE — Progress Notes (Signed)
Speech Language Pathology Treatment: Dysphagia  Patient Details Name: Kenneth [name] MRN: [id number] DOB: [birth year] Today's Date: 05/17/2016 Time: [id number] SLP Time Calculation (min) (ACUTE ONLY): 12 min  Assessment / Plan / Recommendation Clinical Impression  F/u after yesterday's swallow assessment.  Per RN, no coughing noted with meals today.  Pt is alert but confused with delayed verbal responses to questions.  Min verbal cues to follow standard precautions.  Demonstrates oral holding of POs today, requiring multiple f/u sub-swallows and cues to continue.  No overt s/s of aspiration - cognition is primary factor affecting safety with eating at this time.  Will follow.   HPI HPI: 60 year old male admitted 05/12/16 with back pain. PMH significant for NHTN, CKD, medication noncompliance, etoh abuse. Order received for BSE after pt exhibiting coughing after po intake this morning.      SLP Plan  Continue with current plan of care     Recommendations  Diet recommendations:  (renal diet with chopped meats) Liquids provided via: Cup Medication Administration: Whole meds with liquid Supervision: Patient able to self feed Compensations: Minimize environmental distractions;Slow rate;Small sips/bites Postural Changes and/or Swallow Maneuvers: Seated upright 90 degrees             Oral Care Recommendations: Oral care BID Follow up Recommendations: None Plan: Continue with current plan of care     GO                Juan Quam Laurice 05/17/2016, 12:13 PM

## 2016-05-18 DIAGNOSIS — N19 Unspecified kidney failure: Secondary | ICD-10-CM

## 2016-05-18 LAB — BASIC METABOLIC PANEL
Anion gap: 9 (ref 5–15)
BUN: 29 mg/dL — ABNORMAL HIGH (ref 6–20)
CHLORIDE: 102 mmol/L (ref 101–111)
CO2: 26 mmol/L (ref 22–32)
Calcium: 8.2 mg/dL — ABNORMAL LOW (ref 8.9–10.3)
Creatinine, Ser: 6.77 mg/dL — ABNORMAL HIGH (ref 0.61–1.24)
GFR calc Af Amer: 9 mL/min — ABNORMAL LOW (ref >60)
GFR calc non Af Amer: 8 mL/min — ABNORMAL LOW (ref >60)
Glucose, Bld: 93 mg/dL (ref 65–99)
Potassium: 3.7 mmol/L (ref 3.5–5.1)
SODIUM: 137 mmol/L (ref 135–145)

## 2016-05-18 LAB — CBC
HEMATOCRIT: 25.1 % — AB (ref 39.0–52.0)
HEMOGLOBIN: 8 g/dL — AB (ref 13.0–17.0)
MCH: 29.9 pg (ref 26.0–34.0)
MCHC: 31.9 g/dL (ref 30.0–36.0)
MCV: 93.7 fL (ref 78.0–100.0)
Platelets: 206 10*3/uL (ref 150–400)
RBC: 2.68 MIL/uL — ABNORMAL LOW (ref 4.22–5.81)
RDW: 18.1 % — ABNORMAL HIGH (ref 11.5–15.5)
WBC: 6.2 10*3/uL (ref 4.0–10.5)

## 2016-05-18 LAB — PARATHYROID HORMONE, INTACT (NO CA): PTH: 291 pg/mL — AB (ref 15–65)

## 2016-05-18 NOTE — Progress Notes (Signed)
PROGRESS NOTE  Kenneth [name] [name] DOB: Jul 11, 1956 DOA: 05/12/2016   PCP: [name], NP (Inactive)   LOS: 6 days   Brief Narrative: 60 y.o. male with history of hypertension, hyperlipidemia, combined congestive heart failure, ejection fraction 30-35% with grade 3 diastolic dysfunction, chronic kidney disease stage 3-4, medical noncompliance and alcohol abuse presented to [facility] ED due to c/o upper back pain for the last two weeks. He was found to be in [facility] renal failure and transferred to [facility] for renal consult and HD initiation.   Assessment & Plan:  Acute encephalopathy 7/20 - unclear if related to medications vs alcohol withdrawal  - pt currently stable, VSS, protecting airways  - alert this AM, sitting in chair  - CT head with no evidence of stroke  AKI on CKD, stage V - Progressive CKD now stage 5 due to longstanding, poorly controlled HTN as well as NSAIDs - nephrology following, appreciate assistance  - VVS consulted for placement of HD cath and AVF/AVG, appreciate assistance  - Initiated on hemodialysis and now status post placement of left basilic vein transposition fistula on 05/14/16 as we use right IJ TDC for dialysis  - Awaiting outpatient dialysis unit placement  A fib / A flutter with variable conduction - patient's CHA2DS2-VASc Score for Stroke Risk is 4.  - Cardiology consulted, signed off as no further recommendations, no indications for any interventions  - troponins remain elevated, no need to check again as pt with no chest pain   Chest pain  - patient reports intermittent chest pain over the past month especially with exertion - cardiology consulted, appreciate assistance, no further recommendations, signed off - no chest pain this AM  Acute on chronic combined systolic and diastolic CHF with new pulmonary hypertension - Last Echo in 2015 showed an EF of 55-60 %, when he was last admitted for acute on chronic combined CHF.  - Repeat this admission shows  decrease to 25-30% with diffuse hypokinesis, with PA pressure of 76mmHg.  - on coreg, isosorbide - hydralazine  - weight at 82 kg  Alcohol abuse - elevated LFTs c/w ETOH - keep on CIWA  Anemia of Chronic disease, CKD - no signs of bleeding  - CBC in AM  Hypertensive urgency not present on admission  - on coreg, isosorbide - hydralazine  - SBP in 130's this AM - continue same regimen for now   DVT prophylaxis: heparin Code Status: Full Family Communication: no family bedside Disposition Plan: remain inpatient, home once cleared by consultants   Consultants:   Nephrology   Cardiology  Vascular surgery   Procedures:   2D echo:-When compared to the prior study from 12/09/2013 LVEF has decreased, previously 60-65%, now 25-30% with diffuse hypokinesis. There is also a moderate RV systolic dysfunction. Severe pulmonary hypertension is new.  Antimicrobials:  None    Subjective: Flat affect, more alert this AM, wants to sit in a chair and eat breakfast.   Objective: Vitals:   05/17/16 1642 05/17/16 2028 05/18/16 0442 05/18/16 0949  BP: 134/90 122/77 134/87 131/73  Pulse: 83 79 98 97  Resp: 18 19 20 18   Temp: 98.8 F (37.1 C) 98.6 F (37 C) 98.1 F (36.7 C) 98.7 F (37.1 C)  TempSrc: Oral Oral Oral Oral  SpO2: 98% 97% 98% 96%  Weight:  82.8 kg (182 lb 8.7 oz)    Height:        Intake/Output Summary (Last 24 hours) at 05/18/16 1026 Last data filed at 05/18/16 0900  Gross per 24 hour  Intake              483 ml  Output             1550 ml  Net            -1067 ml   Filed Weights   05/17/16 0655 05/17/16 1039 05/17/16 2028  Weight: 85.7 kg (188 lb 15 oz) 84.2 kg (185 lb 10 oz) 82.8 kg (182 lb 8.7 oz)   Examination:  Vitals:   05/17/16 1642 05/17/16 2028 05/18/16 0442 05/18/16 0949  BP: 134/90 122/77 134/87 131/73  Pulse: 83 79 98 97  Resp: 18 19 20 18   Temp: 98.8 F (37.1 C) 98.6 F (37 C) 98.1 F (36.7 C) 98.7 F (37.1 C)  TempSrc: Oral Oral Oral  Oral  SpO2: 98% 97% 98% 96%  Weight:  82.8 kg (182 lb 8.7 oz)    Height:       Eyes: PERRL ENMT: Mucous membranes are moist.  Respiratory: bibasial crackles, no wheezing. Cardiovascular: irregular, tachycardic. Regular rate and rhythm, no murmurs / rubs / gallops. Abdomen: no tenderness. Bowel sounds positive.  Neurologic: more alert, sat in the chair   Data Reviewed: I have personally reviewed following labs and imaging studies  CBC:  Recent Labs Lab 05/12/16 0736  05/14/16 0552 05/15/16 0650 05/16/16 0130 05/17/16 0729 05/18/16 0341  WBC 5.1  < > 6.4 6.6 6.6 5.5 6.2  NEUTROABS 3.7  --   --   --   --   --   --   HGB 9.7*  < > 9.4* 8.1* 8.2* 8.1* 8.0*  HCT 27.7*  < > 27.3* 24.1* 25.5* 25.4* 25.1*  MCV 87.4  < > 88.1 90.3 92.4 93.7 93.7  PLT 185  < > 237 207 198 198 206  < > = values in this interval not displayed. Basic Metabolic Panel:  Recent Labs Lab 05/12/16 1442  05/13/16 1610 05/14/16 0552 05/15/16 0650 05/16/16 0130 05/17/16 0729 05/18/16 0341  NA  --   < > 135 133* 134* 138 137 137  K  --   < > 5.2* 3.9 3.8 4.1 4.1 3.7  CL  --   < > 106 104 104 106 103 102  CO2  --   < > 14* 15* 20* 24 25 26   GLUCOSE  --   < > 105* 92 101* 108* 98 93  BUN  --   < > 124* 92* 57* 29* 43* 29*  CREATININE  --   < > 13.30* 11.04* 8.83* 6.43* 8.94* 6.77*  CALCIUM  --   < > 8.1* 7.4* 7.2* 7.6* 8.0* 8.2*  PHOS 8.0*  --  9.1*  --  4.3 2.6 4.0  --   < > = values in this interval not displayed. Liver Function Tests:  Recent Labs Lab 05/12/16 0736 05/13/16 0159 05/13/16 1610 05/15/16 0650 05/16/16 0130 05/17/16 0729  AST 73* 237*  --   --   --   --   ALT 79* 161*  --   --   --   --   ALKPHOS 59 102  --   --   --   --   BILITOT 1.1 1.1  --   --   --   --   PROT 8.5* 7.4  --   --   --   --   ALBUMIN 4.0 3.1* 3.2* 2.7* 2.9* 2.8*   Coagulation Profile:  Recent Labs Lab  05/13/16 0503  INR 1.51*   Cardiac Enzymes:  Recent Labs Lab 05/13/16 0159 05/15/16 1047  05/15/16 1948 05/16/16 0130 05/16/16 0755  TROPONINI 0.28* 1.25* 1.30* 1.11* 1.30*   Urine analysis:    Component Value Date/Time   COLORURINE YELLOW 05/12/2016 0834   APPEARANCEUR CLOUDY (A) 05/12/2016 0834   LABSPEC 1.014 05/12/2016 0834   PHURINE 5.0 05/12/2016 0834   GLUCOSEU NEGATIVE 05/12/2016 0834   HGBUR MODERATE (A) 05/12/2016 0834   BILIRUBINUR NEGATIVE 05/12/2016 0834   KETONESUR NEGATIVE 05/12/2016 0834   PROTEINUR 100 (A) 05/12/2016 0834   UROBILINOGEN 0.2 12/09/2013 0013   NITRITE NEGATIVE 05/12/2016 0834   LEUKOCYTESUR NEGATIVE 05/12/2016 0834   Recent Results (from the past 240 hour(s))  Urine culture     Status: Abnormal   Collection Time: 05/12/16  8:24 AM  Result Value Ref Range Status   Specimen Description URINE, CLEAN CATCH  Final   Special Requests NONE  Final   Culture (A)  Final    7,000 COLONIES/mL INSIGNIFICANT GROWTH Performed at [facility]    Report Status 05/13/2016 FINAL  Final    Radiology Studies: No results found.  Scheduled Meds: . carvedilol  6.25 mg Oral BID WC  . darbepoetin (ARANESP) injection - DIALYSIS  60 mcg Intravenous Q Sat-HD  . doxercalciferol  2 mcg Intravenous Q T,Th,Sa-HD  . folic acid  1 mg Oral Daily  . heparin  5,000 Units Subcutaneous Q8H  . isosorbide-hydrALAZINE  1 tablet Oral TID  . multivitamin with minerals  1 tablet Oral Daily  . sodium chloride flush  3 mL Intravenous Q12H  . thiamine  100 mg Oral Daily   Continuous Infusions: . sodium chloride 10 mL/hr at 05/14/16 1157   Faye Ramsay, MD  [facility] Pager [phone]  If 7PM-7AM, please contact night-coverage [email or web address] Password [facility] 05/18/2016, 10:26 AM

## 2016-05-18 NOTE — Progress Notes (Signed)
Patient ID: Kenneth [name], male   DOB: 1956/01/19, 60 y.o.   MRN: [id number] [facility] Progress Note   Assessment/ Plan:   1. Acute delirium: Suspected to be related to alcohol withdrawal versus medication induced. 2. ESRD: With progression of chronic kidney disease stage V from underlying hypertension/NSAIDs now to ESRD. Initiated on hemodialysis and now status post placement of left basilic vein transposition fistula on 05/14/16 as we use right IJ [facility] for dialysis. Awaiting outpatient dialysis unit placement. Next HD due Tuesday.  3. Anemia: Without overt loss-SPEP concerning for small M spike. Started on ESA with acceptable iron stores. 4. CKD-MBD: Low phosphorus-likely secondary to chronic malnutrition/poor intake with alcoholism. On low-dose vitamin D receptor analogue. 5. Nutrition: Chronic malnutrition secondary to alcoholism/impaired caloric intake. 6. Hypertension: Blood pressure elevated, anticipate to improve with ultrafiltration/hemodialysis.  Subjective:   Denies any complaints-appears to be very slow to understand and respond to questions.    Objective:   BP 131/73 (BP Location: Right Arm)   Pulse 97   Temp 98.7 F (37.1 C) (Oral)   Resp 18   Ht 5\' 10"  (1.778 m)   Wt 82.8 kg (182 lb 8.7 oz)   SpO2 96%   BMI 26.19 kg/m   Physical Exam: Gen: Mentation today appears to be somewhat better compared to that from yesterday CVS: Pulse regular rhythm, S1 and S2 normal Resp: Clear to auscultation, no rales Abd: Soft, obese, nontender Ext: No lower extremity edema. Left BVT with thrill and bruit  Labs: BMET  Recent Labs Lab 05/12/16 1442 05/13/16 0159 05/13/16 1610 05/14/16 0552 05/15/16 0650 05/16/16 0130 05/17/16 0729 05/18/16 0341  NA  --  132* 135 133* 134* 138 137 137  K  --  4.8 5.2* 3.9 3.8 4.1 4.1 3.7  CL  --  107 106 104 104 106 103 102  CO2  --  12* 14* 15* 20* 24 25 26   GLUCOSE  --  100* 105* 92 101* 108* 98 93  BUN  --  122* 124* 92* 57*  29* 43* 29*  CREATININE  --  13.29* 13.30* 11.04* 8.83* 6.43* 8.94* 6.77*  CALCIUM  --  7.6* 8.1* 7.4* 7.2* 7.6* 8.0* 8.2*  PHOS 8.0*  --  9.1*  --  4.3 2.6 4.0  --    CBC  Recent Labs Lab 05/12/16 0736  05/15/16 0650 05/16/16 0130 05/17/16 0729 05/18/16 0341  WBC 5.1  < > 6.6 6.6 5.5 6.2  NEUTROABS 3.7  --   --   --   --   --   HGB 9.7*  < > 8.1* 8.2* 8.1* 8.0*  HCT 27.7*  < > 24.1* 25.5* 25.4* 25.1*  MCV 87.4  < > 90.3 92.4 93.7 93.7  PLT 185  < > 207 198 198 206  < > = values in this interval not displayed.  Medications:    . carvedilol  6.25 mg Oral BID WC  . darbepoetin (ARANESP) injection - DIALYSIS  60 mcg Intravenous Q Sat-HD  . doxercalciferol  2 mcg Intravenous Q T,Th,Sa-HD  . folic acid  1 mg Oral Daily  . heparin  5,000 Units Subcutaneous Q8H  . isosorbide-hydrALAZINE  1 tablet Oral TID  . multivitamin with minerals  1 tablet Oral Daily  . sodium chloride flush  3 mL Intravenous Q12H  . thiamine  100 mg Oral Daily   Elmarie Shiley, MD 05/18/2016, 10:35 AM

## 2016-05-19 LAB — RENAL FUNCTION PANEL
Albumin: 2.6 g/dL — ABNORMAL LOW (ref 3.5–5.0)
Anion gap: 10 (ref 5–15)
BUN: 42 mg/dL — AB (ref 6–20)
CHLORIDE: 101 mmol/L (ref 101–111)
CO2: 25 mmol/L (ref 22–32)
CREATININE: 8.87 mg/dL — AB (ref 0.61–1.24)
Calcium: 8.1 mg/dL — ABNORMAL LOW (ref 8.9–10.3)
GFR calc Af Amer: 7 mL/min — ABNORMAL LOW (ref >60)
GFR calc non Af Amer: 6 mL/min — ABNORMAL LOW (ref >60)
Glucose, Bld: 135 mg/dL — ABNORMAL HIGH (ref 65–99)
Phosphorus: 4 mg/dL (ref 2.5–4.6)
Potassium: 3.8 mmol/L (ref 3.5–5.1)
SODIUM: 136 mmol/L (ref 135–145)

## 2016-05-19 LAB — CBC
HCT: 22.9 % — ABNORMAL LOW (ref 39.0–52.0)
Hemoglobin: 7.3 g/dL — ABNORMAL LOW (ref 13.0–17.0)
MCH: 30 pg (ref 26.0–34.0)
MCHC: 31.9 g/dL (ref 30.0–36.0)
MCV: 94.2 fL (ref 78.0–100.0)
PLATELETS: 173 10*3/uL (ref 150–400)
RBC: 2.43 MIL/uL — ABNORMAL LOW (ref 4.22–5.81)
RDW: 17.9 % — AB (ref 11.5–15.5)
WBC: 5.4 10*3/uL (ref 4.0–10.5)

## 2016-05-19 NOTE — Progress Notes (Signed)
PROGRESS NOTE  Kenneth [name] [name] DOB: [birth year] DOA: 05/12/2016   PCP: [name], NP (Inactive)   LOS: 7 days   Brief Narrative: 60 y.o. male with history of hypertension, hyperlipidemia, combined congestive heart failure, ejection fraction 30-35% with grade 3 diastolic dysfunction, chronic kidney disease stage 3-4, medical noncompliance and alcohol abuse presented to [facility] ED due to c/o upper back pain for the last two weeks. He was found to be in [facility] renal failure and transferred to [facility] for renal consult and HD initiation.   Assessment & Plan:  Acute encephalopathy 7/20 - unclear if related to medications vs alcohol withdrawal  - pt currently stable, VSS, protecting airways  - alert this AM, sitting in chair  - CT head with no evidence of stroke  AKI on CKD, stage V - Progressive CKD now stage 5 due to longstanding, poorly controlled HTN as well as NSAIDs - nephrology following, appreciate assistance  - VVS consulted for placement of HD cath and AVF/AVG, appreciate assistance  - Initiated on hemodialysis and now status post placement of left basilic vein transposition fistula on 05/14/16 as we use right IJ TDC for dialysis  - Awaiting outpatient dialysis unit placement  A fib / A flutter with variable conduction - patient's CHA2DS2-VASc Score for Stroke Risk is 4.  - Cardiology consulted, signed off as no further recommendations, no indications for any interventions  - troponins remain elevated, no need to check again as pt with no chest pain   Chest pain  - cardiology consulted, appreciate assistance, no further recommendations, signed off - no chest pain this AM  Acute on chronic combined systolic and diastolic CHF with new pulmonary hypertension - Last Echo in 2015 showed an EF of 55-60 %, when he was last admitted for acute on chronic combined CHF.  - Repeat this admission shows decrease to 25-30% with diffuse hypokinesis, with PA pressure of 24mmHg.  - on coreg,  isosorbide - hydralazine  - weight at 82 kg  Alcohol abuse - elevated LFTs c/w ETOH - d/c CIWA as pt with no withdrawals at this time   Anemia of Chronic disease, CKD - no signs of bleeding but Hg is down, plan to transfuse with next HD session  - CBC in AM  Hypertensive urgency not present on admission  - on coreg, isosorbide - hydralazine  - SBP in 130's this AM - continue same regimen for now   DVT prophylaxis: heparin Code Status: Full Family Communication: no family bedside Disposition Plan: remain inpatient, home once cleared by consultants   Consultants:   Nephrology   Cardiology  Vascular surgery   Procedures:   2D echo:-When compared to the prior study from 12/09/2013 LVEF has decreased, previously 60-65%, now 25-30% with diffuse hypokinesis. There is also a moderate RV systolic dysfunction. Severe pulmonary hypertension is new.  Antimicrobials:  None    Subjective: Flat affect, more alert this AM.  Objective: Vitals:   05/18/16 1700 05/18/16 2050 05/19/16 0429 05/19/16 0908  BP: 125/61 105/61 115/73 117/67  Pulse: 81 91 82 81  Resp: 18 18 16 17   Temp: 97.7 F (36.5 C) 98.5 F (36.9 C) 98.4 F (36.9 C) 98.1 F (36.7 C)  TempSrc: Oral Oral Oral Oral  SpO2: 100% 98% 99% 98%  Weight:  82 kg (180 lb 12.4 oz)    Height:        Intake/Output Summary (Last 24 hours) at 05/19/16 1229 Last data filed at 05/19/16 0935  Gross per 24 hour  Intake              660 ml  Output                0 ml  Net              660 ml   Filed Weights   05/17/16 1039 05/17/16 2028 05/18/16 2050  Weight: 84.2 kg (185 lb 10 oz) 82.8 kg (182 lb 8.7 oz) 82 kg (180 lb 12.4 oz)   Examination:  Vitals:   05/18/16 1700 05/18/16 2050 05/19/16 0429 05/19/16 0908  BP: 125/61 105/61 115/73 117/67  Pulse: 81 91 82 81  Resp: 18 18 16 17   Temp: 97.7 F (36.5 C) 98.5 F (36.9 C) 98.4 F (36.9 C) 98.1 F (36.7 C)  TempSrc: Oral Oral Oral Oral  SpO2: 100% 98% 99% 98%    Weight:  82 kg (180 lb 12.4 oz)    Height:       Eyes: PERRL ENMT: Mucous membranes are moist.  Respiratory: bibasial crackles, no wheezing. Cardiovascular: irregular, tachycardic. Regular rate and rhythm, no murmurs / rubs / gallops. Abdomen: no tenderness. Bowel sounds positive.  Neurologic: more alert, sat in the chair   Data Reviewed: I have personally reviewed following labs and imaging studies  CBC:  Recent Labs Lab 05/15/16 0650 05/16/16 0130 05/17/16 0729 05/18/16 0341 05/19/16 0407  WBC 6.6 6.6 5.5 6.2 5.4  HGB 8.1* 8.2* 8.1* 8.0* 7.3*  HCT 24.1* 25.5* 25.4* 25.1* 22.9*  MCV 90.3 92.4 93.7 93.7 94.2  PLT 207 198 198 206 [id number]   Basic Metabolic Panel:  Recent Labs Lab 05/13/16 1610  05/15/16 0650 05/16/16 0130 05/17/16 0729 05/18/16 0341 05/19/16 0407  NA 135  < > 134* 138 137 137 136  K 5.2*  < > 3.8 4.1 4.1 3.7 3.8  CL 106  < > 104 106 103 102 101  CO2 14*  < > 20* 24 25 26 25   GLUCOSE 105*  < > 101* 108* 98 93 135*  BUN 124*  < > 57* 29* 43* 29* 42*  CREATININE 13.30*  < > 8.83* 6.43* 8.94* 6.77* 8.87*  CALCIUM 8.1*  < > 7.2* 7.6* 8.0* 8.2* 8.1*  PHOS 9.1*  --  4.3 2.6 4.0  --  4.0  < > = values in this interval not displayed. Liver Function Tests:  Recent Labs Lab 05/13/16 0159 05/13/16 1610 05/15/16 0650 05/16/16 0130 05/17/16 0729 05/19/16 0407  AST 237*  --   --   --   --   --   ALT 161*  --   --   --   --   --   ALKPHOS 102  --   --   --   --   --   BILITOT 1.1  --   --   --   --   --   PROT 7.4  --   --   --   --   --   ALBUMIN 3.1* 3.2* 2.7* 2.9* 2.8* 2.6*   Coagulation Profile:  Recent Labs Lab 05/13/16 0503  INR 1.51*   Cardiac Enzymes:  Recent Labs Lab 05/13/16 0159 05/15/16 1047 05/15/16 1948 05/16/16 0130 05/16/16 0755  TROPONINI 0.28* 1.25* 1.30* 1.11* 1.30*   Urine analysis:    Component Value Date/Time   COLORURINE YELLOW 05/12/2016 0834   APPEARANCEUR CLOUDY (A) 05/12/2016 0834   LABSPEC 1.014 05/12/2016  0834   PHURINE 5.0 05/12/2016 0834   GLUCOSEU NEGATIVE  05/12/2016 0834   HGBUR MODERATE (A) 05/12/2016 0834   BILIRUBINUR NEGATIVE 05/12/2016 [place] 05/12/2016 0834   PROTEINUR 100 (A) 05/12/2016 0834   UROBILINOGEN 0.2 12/09/2013 0013   NITRITE NEGATIVE 05/12/2016 0834   LEUKOCYTESUR NEGATIVE 05/12/2016 0834   Recent Results (from the past 240 hour(s))  Urine culture     Status: Abnormal   Collection Time: 05/12/16  8:24 AM  Result Value Ref Range Status   Specimen Description URINE, CLEAN CATCH  Final   Special Requests NONE  Final   Culture (A)  Final    7,000 COLONIES/mL INSIGNIFICANT GROWTH Performed at [facility]    Report Status 05/13/2016 FINAL  Final    Radiology Studies: No results found.  Scheduled Meds: . carvedilol  6.25 mg Oral BID WC  . darbepoetin (ARANESP) injection - DIALYSIS  60 mcg Intravenous Q Sat-HD  . doxercalciferol  2 mcg Intravenous Q T,Th,Sa-HD  . folic acid  1 mg Oral Daily  . heparin  5,000 Units Subcutaneous Q8H  . isosorbide-hydrALAZINE  1 tablet Oral TID  . multivitamin with minerals  1 tablet Oral Daily  . sodium chloride flush  3 mL Intravenous Q12H  . thiamine  100 mg Oral Daily   Continuous Infusions: . sodium chloride 10 mL/hr at 05/14/16 1157   Faye Ramsay, MD  [facility] Pager [phone]  If 7PM-7AM, please contact night-coverage [email or web address] Password [facility] 05/19/2016, 12:29 PM

## 2016-05-19 NOTE — Progress Notes (Signed)
[facility] Progress Note  Subjective:  Sleeping, easily aroused, responds yes/no to some questions, otherwise keeping eyes closed.  Denies pain/SOB.   Objective Vitals:   05/18/16 1700 05/18/16 2050 05/19/16 0429 05/19/16 0908  BP: 125/61 105/61 115/73 117/67  Pulse: 81 91 82 81  Resp: 18 18 16 17   Temp: 97.7 F (36.5 C) 98.5 F (36.9 C) 98.4 F (36.9 C) 98.1 F (36.7 C)  TempSrc: Oral Oral Oral Oral  SpO2: 100% 98% 99% 98%  Weight:  82 kg (180 lb 12.4 oz)    Height:       Physical Exam General: chronically ill appearing male, NAD Heart: S1,S2, regularly irregular. AFib on monitor.  Lungs: BBS dec in bases posteriorly  CTA Abdomen: soft, nontender Extremities: No LE edema Dialysis Access: New LUA AVF + bruit. RIJ TDC without biopatch on catheter-HD nurses called to redress.     Additional Objective Labs: Basic Metabolic Panel:  Recent Labs Lab 05/16/16 0130 05/17/16 0729 05/18/16 0341 05/19/16 0407  NA 138 137 137 136  K 4.1 4.1 3.7 3.8  CL 106 103 102 101  CO2 24 25 26 25   GLUCOSE 108* 98 93 135*  BUN 29* 43* 29* 42*  CREATININE 6.43* 8.94* 6.77* 8.87*  CALCIUM 7.6* 8.0* 8.2* 8.1*  PHOS 2.6 4.0  --  4.0   Liver Function Tests:  Recent Labs Lab 05/13/16 0159  05/16/16 0130 05/17/16 0729 05/19/16 0407  AST 237*  --   --   --   --   ALT 161*  --   --   --   --   ALKPHOS 102  --   --   --   --   BILITOT 1.1  --   --   --   --   PROT 7.4  --   --   --   --   ALBUMIN 3.1*  < > 2.9* 2.8* 2.6*  < > = values in this interval not displayed. No results for input(s): LIPASE, AMYLASE in the last 168 hours. CBC:  Recent Labs Lab 05/15/16 0650 05/16/16 0130 05/17/16 0729 05/18/16 0341 05/19/16 0407  WBC 6.6 6.6 5.5 6.2 5.4  HGB 8.1* 8.2* 8.1* 8.0* 7.3*  HCT 24.1* 25.5* 25.4* 25.1* 22.9*  MCV 90.3 92.4 93.7 93.7 94.2  PLT 207 198 198 206 173   Blood Culture    Component Value Date/Time   SDES URINE, CLEAN CATCH 05/12/2016 0824   SPECREQUEST NONE 05/12/2016 0824   CULT (A) 05/12/2016 0824    7,000 COLONIES/mL INSIGNIFICANT GROWTH Performed at [facility] 05/13/2016 FINAL 05/12/2016 0824    Cardiac Enzymes:  Recent Labs Lab 05/13/16 0159 05/15/16 1047 05/15/16 1948 05/16/16 0130 05/16/16 0755  TROPONINI 0.28* 1.25* 1.30* 1.11* 1.30*   CBG:  Recent Labs Lab 05/14/16 1707 05/14/16 1747 05/15/16 0943  GLUCAP 121* 112* 134*   Iron Studies: No results for input(s): IRON, TIBC, TRANSFERRIN, FERRITIN in the last 72 hours. @lablastinr3 @ Studies/Results: No results found. Medications: . sodium chloride 10 mL/hr at 05/14/16 1157   . carvedilol  6.25 mg Oral BID WC  . darbepoetin (ARANESP) injection - DIALYSIS  60 mcg Intravenous Q Sat-HD  . doxercalciferol  2 mcg Intravenous Q T,Th,Sa-HD  . folic acid  1 mg Oral Daily  . heparin  5,000 Units Subcutaneous Q8H  . isosorbide-hydrALAZINE  1 tablet Oral TID  . multivitamin with minerals  1 tablet Oral Daily  . sodium chloride flush  3 mL Intravenous Q12H  . thiamine  100 mg Oral Daily   1. Acute delirium: Suspected to be related to alcohol withdrawal versus medication induced. Minimally interactive. Has some speech difficulty, not sure what his baseline is though.  Got up and walked in the room, mild instability but walked to the chair w/o problems.  Will get PT to see, when mobility cleared is ready for dc from renal standpoint.  2. Afib/Aflutter: per primary. Rate controlled. No anticoagulation.  3. ESRD: With progression of chronic kidney disease stage V from underlying hypertension/NSAIDs now to ESRD. Initiated on hemodialysis and now status post placement of left basilic vein transposition fistula on 05/14/16 as we use right IJ [facility] for dialysis. Clip process in progress for OP HD center. Will go to [facility] TTS 2nd shift. K+ 3.8 4.0 K [facility].  4. Anemia: HGB down to 7.3. Recheck in AM and transfuse 2 units PRBCs while on HD. Hemodynamically  stable. Spoke with primary.  5. CKD-MBD: Low phosphorus-likely secondary to chronic malnutrition/poor intake with alcoholism. On low-dose vitamin D receptor analogue. Ca+ 8.1 C Ca 9.22 Use 2.25 Ca [facility].  6. Nutrition: Chronic malnutrition secondary to alcoholism/impaired caloric intake. 7. Hypertension/Volume: Blood pressure SBP 100-120s. Last HD 05/17/16 pre wt 85.7 Net UF 1500 Post wt 84.2 kg. Wt today 82 kg. Appears to be at nadir wt. Minimal UF tomorrow.  8. Nutrition: Albumin 2.6 renal diet, prostat. Thiamine/renal vit. 9. Acute on Chronic combined systolic/diastolic HF with pulmonary hypertension:  On coreg, hydralazine. Per primary 10. ETOH abuse - says he quit, "2 wks ago".     Rita H. Brown NP-C 05/19/2016, 10:54 AM  [facility] [phone]  Pt seen, examined, agree w assess/plan as above with additions as indicated.  Kelly Splinter MD [employer] pager [phone]    cell [phone] 05/19/2016, 1:43 PM

## 2016-05-19 NOTE — Progress Notes (Signed)
Speech Language Pathology Treatment: Dysphagia  Patient Details Name: Kenneth [name] MRN: [id number] DOB: 1956/06/08 Today's Date: 05/19/2016 Time: 6168-3729 SLP Time Calculation (min) (ACUTE ONLY): 8 min  Assessment / Plan / Recommendation Clinical Impression  Pt remains confused, but level of alertness has improved.  Is tolerating a regular consistency diet (renal) with thin liquids with improved toleration since initial evaluation. Mod I with precautions. Intake has been 90-100% at meals; no further observed s/s of aspiration.  Lungs sounds are diminished but clear.  Pt is afebrile.  Recommend continuing current diet. No further SLP needs identified - our services will sign off.    HPI HPI: 60 year old male admitted 05/12/16 with back pain. PMH significant for NHTN, CKD, medication noncompliance, etoh abuse. Order received for BSE after pt exhibiting coughing after po intake this morning.      SLP Plan  All goals met     Recommendations  Diet recommendations: Thin liquid (renal diet) Liquids provided via: Cup;Straw Medication Administration: Whole meds with liquid Supervision: Patient able to self feed Compensations: Minimize environmental distractions Postural Changes and/or Swallow Maneuvers: Seated upright 90 degrees             Oral Care Recommendations: Oral care BID Follow up Recommendations: None Plan: All goals met     GO                Kenneth [name] 05/19/2016, 11:40 AM

## 2016-05-19 NOTE — Progress Notes (Signed)
05/19/2016 3:31 PM Hemodialysis Outpatient Note; Kenneth [name] has been accepted at the [facility] Dialysis center on a Tuesday, Thursday and Saturday 2nd shift. The center will advise US of the chair time when the patient is close to discharge. Thank you. Gordy Savers

## 2016-05-19 NOTE — Progress Notes (Signed)
PT Cancellation Note  Patient Details Name: Kenneth [name] MRN: [id number] DOB: 1955-11-02   Cancelled Treatment:    Reason Eval/Treat Not Completed: Patient declined, no reason specified Pt declined working with therapy this PM. Will follow up as time allows.   Marguarite Arbour A Laquesha Holcomb 05/19/2016, 3:59 PM Wray Kearns, [place], DPT [phone]

## 2016-05-20 LAB — RENAL FUNCTION PANEL
ALBUMIN: 2.8 g/dL — AB (ref 3.5–5.0)
Anion gap: 11 (ref 5–15)
BUN: 52 mg/dL — AB (ref 6–20)
CHLORIDE: 100 mmol/L — AB (ref 101–111)
CO2: 24 mmol/L (ref 22–32)
CREATININE: 10.44 mg/dL — AB (ref 0.61–1.24)
Calcium: 8.6 mg/dL — ABNORMAL LOW (ref 8.9–10.3)
GFR calc Af Amer: 5 mL/min — ABNORMAL LOW (ref >60)
GFR calc non Af Amer: 5 mL/min — ABNORMAL LOW (ref >60)
GLUCOSE: 94 mg/dL (ref 65–99)
PHOSPHORUS: 4.8 mg/dL — AB (ref 2.5–4.6)
POTASSIUM: 4.3 mmol/L (ref 3.5–5.1)
Sodium: 135 mmol/L (ref 135–145)

## 2016-05-20 LAB — CBC
HEMATOCRIT: 26.2 % — AB (ref 39.0–52.0)
Hemoglobin: 8.3 g/dL — ABNORMAL LOW (ref 13.0–17.0)
MCH: 29.6 pg (ref 26.0–34.0)
MCHC: 31.7 g/dL (ref 30.0–36.0)
MCV: 93.6 fL (ref 78.0–100.0)
PLATELETS: 211 10*3/uL (ref 150–400)
RBC: 2.8 MIL/uL — ABNORMAL LOW (ref 4.22–5.81)
RDW: 17.2 % — AB (ref 11.5–15.5)
WBC: 5.4 10*3/uL (ref 4.0–10.5)

## 2016-05-20 MED ORDER — DOXERCALCIFEROL 4 MCG/2ML IV SOLN
INTRAVENOUS | Status: AC
  Filled 2016-05-20: qty 2

## 2016-05-20 MED ORDER — ALTEPLASE 2 MG IJ SOLR: 2.0000 mg | Freq: Once | INTRAMUSCULAR | Status: DC | PRN

## 2016-05-20 MED ORDER — LIDOCAINE HCL (PF) 1 % IJ SOLN: 5.0000 mL | INTRAMUSCULAR | Status: DC | PRN

## 2016-05-20 MED ORDER — PENTAFLUOROPROP-TETRAFLUOROETH EX AERO: 1.0000 "application " | INHALATION_SPRAY | CUTANEOUS | Status: DC | PRN

## 2016-05-20 MED ORDER — SODIUM CHLORIDE 0.9 % IV SOLN: 100.0000 mL | INTRAVENOUS | Status: DC | PRN

## 2016-05-20 MED ORDER — LIDOCAINE-PRILOCAINE 2.5-2.5 % EX CREA: 1.0000 "application " | TOPICAL_CREAM | CUTANEOUS | Status: DC | PRN

## 2016-05-20 MED ORDER — HEPARIN SODIUM (PORCINE) 1000 UNIT/ML DIALYSIS: 1000.0000 [IU] | INTRAMUSCULAR | Status: DC | PRN

## 2016-05-20 NOTE — Progress Notes (Signed)
PT Cancellation Note  Patient Details Name: Thurlow Bellissimo MRN: [id number] DOB: 07-08-1956   Cancelled Treatment:    Reason Eval/Treat Not Completed: Patient at procedure or test/unavailable  Pt off floor for HD. Will follow up as time allows.  Marguarite Arbour A Dalon Reichart 05/20/2016, 8:37 AM Wray Kearns, PT, DPT [phone]

## 2016-05-20 NOTE — Progress Notes (Signed)
Patient in HD at this time.

## 2016-05-20 NOTE — Evaluation (Signed)
Physical Therapy Evaluation Patient Details Name: Kenneth [name] MRN: [id number] DOB: 03/27/1956 Today's Date: 05/20/2016   History of Present Illness  Patient is a 60 y/o male with hx of HTN, alcohol abuse, CKD, CHF with ejection fraction 30-35% with grade 3 diastolic dysfunction, presents with upper back pain, found to be in renal failure and initiated on HD.cs/p left BVT.  Clinical Impression  Patient presents with generalized weakness, confusion, balance deficits and impaired safety awareness impacting mobility. Not able to get accurate PLOF/history as no family members present. Today, pt requires assist with standing and Min A for balance during gait training. If pt able to have 24/7 S at home initially for the first week, safe to return home with HHPT and RW, however if daughter works, pt may need short term SNF. Will follow acutely to maximize independence and mobility prior to return home.    Follow Up Recommendations Home health PT;Supervision for mobility/OOB;Supervision/Assistance - 24 hour    Equipment Recommendations  Rolling walker with 5" wheels    Recommendations for Other Services OT consult;Speech consult (cognitive evaluation)     Precautions / Restrictions Precautions Precautions: Fall Restrictions Weight Bearing Restrictions: No      Mobility  Bed Mobility               General bed mobility comments: Up in chair upon PT arrival.   Transfers Overall transfer level: Needs assistance Equipment used: Rolling walker (2 wheeled) Transfers: Sit to/from Stand Sit to Stand: Min assist         General transfer comment: Min A to boost from EOB with cues for hand placement.   Ambulation/Gait Ambulation/Gait assistance: Min assist Ambulation Distance (Feet): 150 Feet Assistive device: Rolling walker (2 wheeled) Gait Pattern/deviations: Step-through pattern;Decreased stride length;Trunk flexed   Gait velocity interpretation: Below normal speed for  age/gender General Gait Details: Mildly unsteady gait with use of RW. Difficulty with turns. Pt in A-fib. HR 105 bpm.  Stairs            Wheelchair Mobility    Modified Rankin (Stroke Patients Only)       Balance Overall balance assessment: Needs assistance Sitting-balance support: Feet supported;No upper extremity supported Sitting balance-Leahy Scale: Good     Standing balance support: During functional activity Standing balance-Leahy Scale: Poor Standing balance comment: Reilant on BUEs for support in standing.                             Pertinent Vitals/Pain Pain Assessment: No/denies pain    Home Living Family/patient expects to be discharged to:: Private residence Living Arrangements: Children (reports daughter) Available Help at Discharge: Family Type of Home: House Home Access: Stairs to enter Entrance Stairs-Rails: Right Entrance Stairs-Number of Steps: 5 Home Layout: One level Home Equipment: None      Prior Function Level of Independence: Independent         Comments: Not sure of accuracy of reported PLOF/history as no family members present during session. Pt reports independent.     Hand Dominance        Extremity/Trunk Assessment   Upper Extremity Assessment: Defer to OT evaluation           Lower Extremity Assessment: Generalized weakness         Communication   Communication: No difficulties  Cognition Arousal/Alertness: Awake/alert Behavior During Therapy: Flat affect Overall Cognitive Status: No family/caregiver present to determine baseline cognitive functioning (A&O x1, able to state  yes to being in hospital.)                      General Comments      Exercises        Assessment/Plan    PT Assessment Patient needs continued PT services  PT Diagnosis Difficulty walking   PT Problem List Decreased strength;Decreased mobility;Decreased safety awareness;Decreased balance;Decreased  cognition;Decreased activity tolerance;Decreased knowledge of use of DME  PT Treatment Interventions Gait training;Therapeutic activities;Therapeutic exercise;Stair training;Functional mobility training;Balance training;Patient/family education;DME instruction   PT Goals (Current goals can be found in the Care Plan section) Acute Rehab PT Goals Patient Stated Goal: none stated PT Goal Formulation: With patient Time For Goal Achievement: 06/03/16 Potential to Achieve Goals: Good    Frequency Min 3X/week   Barriers to discharge Inaccessible home environment;Decreased caregiver support not sure of level of support at home; stairs to enter home    Co-evaluation               End of Session Equipment Utilized During Treatment: Gait belt Activity Tolerance: Patient tolerated treatment well Patient left: in chair;with call bell/phone within reach;with chair alarm set;Other (comment) (left at nurses station ) Nurse Communication: Mobility status         Time: 1451-1506 PT Time Calculation (min) (ACUTE ONLY): 15 min   Charges:   PT Evaluation $PT Eval Moderate Complexity: 1 Procedure     PT G Codes:        Ellerie Arenz A Byrd Terrero 05/20/2016, 3:14 PM Wray Kearns, [place], DPT [phone]

## 2016-05-20 NOTE — Care Management Note (Signed)
Case Management Note  Patient Details  Name: Kenneth [name] MRN: [id number] Date of Birth: [birth year]  Subjective/Objective:    CM following for progression and d/c planning.          Action/Plan: 05/20/2016 Met with pt daughter, pt in HD who had questions re HD for her father as he has no insurance and questions re medications. This pt has a medicaid pending number which has enable the sec in HD unit to arrange outpatient HD for this pt. This was explained to pt daughter. We will provide a [place] letter at the time of d/c to assist with medications, as the pt does not yet have a Medicaid card.  Daughter also asked about disability and was instructed to assist the pt with this application after his d/c from the hospital. The daughter was also given info re SCAT for this pt. Pt needs to be able to sit for HD prior to d/c.   Expected Discharge Date:   (unknown)               Expected Discharge Plan:  [place]  In-House Referral:  NA  Discharge planning Services  CM Consult, [facility], [facility]  Post Acute Care Choice:    Choice offered to:     DME Arranged:    DME Agency:     HH Arranged:    HH Agency:     Status of Service:  In process, will continue to follow  If discussed at Long Length of Stay Meetings, dates discussed:    Additional Comments:  Adron Bene, RN 05/20/2016, 11:53 AM

## 2016-05-20 NOTE — Progress Notes (Signed)
PROGRESS NOTE  Kenneth [name] [name] DOB: May 04, 1956 DOA: 05/12/2016   PCP: [name], NP (Inactive)   LOS: 8 days   Brief Narrative: 60 y.o. male with history of hypertension, hyperlipidemia, combined congestive heart failure, ejection fraction 30-35% with grade 3 diastolic dysfunction, chronic kidney disease stage 3-4, medical noncompliance and alcohol abuse presented to [facility] ED due to c/o upper back pain for the last two weeks. He was found to be in [facility] renal failure and transferred to [facility] for renal consult and HD initiation.   Assessment & Plan:  Acute encephalopathy 7/20 - unclear if related to medications vs alcohol withdrawal  - resolved  - CT head with no evidence of stroke  AKI on CKD, stage V - Progressive CKD now stage 5 due to longstanding, poorly controlled HTN as well as NSAIDs - nephrology following, appreciate assistance  - VVS consulted for placement of HD cath and AVF/AVG, appreciate assistance  - Initiated on hemodialysis and now status post placement of left basilic vein transposition fistula on 05/14/16 as we use right IJ TDC for dialysis  - set up with OP HD - PT eval pending, can not d/c yet until physical status determined as pt may need short term SNF   A fib / A flutter with variable conduction - patient's CHA2DS2-VASc Score for Stroke Risk is 4.  - Cardiology consulted, signed off as no further recommendations, no indications for any interventions  - troponins remain elevated, no need to check again as pt with no chest pain   Chest pain  - cardiology consulted, appreciate assistance, no further recommendations, signed off - no chest pain this AM  Acute on chronic combined systolic and diastolic CHF with new pulmonary hypertension - Last Echo in 2015 showed an EF of 55-60 %, when he was last admitted for acute on chronic combined CHF.  - Repeat this admission shows decrease to 25-30% with diffuse hypokinesis, with PA pressure of 49mmHg.  - on  coreg, isosorbide - hydralazine   Alcohol abuse - elevated LFTs c/w ETOH - d/c CIWA as pt with no withdrawals at this time   Anemia of Chronic disease, CKD - no signs of bleeding but Hg is down, plan to transfuse with next HD session   Hypertensive urgency not present on admission  - on coreg, isosorbide - hydralazine  - SBP in 130's this AM - continue same regimen for now   DVT prophylaxis: heparin Code Status: Full Family Communication: no family bedside Disposition Plan: remain inpatient, home vs SNF once seen by PT, awaiting recommendations   Consultants:   Nephrology   Cardiology  Vascular surgery   Procedures:   2D echo:-When compared to the prior study from 12/09/2013 LVEF has decreased, previously 60-65%, now 25-30% with diffuse hypokinesis. There is also a moderate RV systolic dysfunction. Severe pulmonary hypertension is new.  Antimicrobials:  None    Subjective: Flat affect, more alert this AM.  Objective: Vitals:   05/20/16 1130 05/20/16 1200 05/20/16 1218 05/20/16 1306  BP: (!) 162/100 (!) 166/94 (!) 166/96 (!) 175/85  Pulse: 86 84 82 88  Resp: 20 20 20 18   Temp:   98 F (36.7 C) 98.4 F (36.9 C)  TempSrc:   Oral Oral  SpO2:   99% 100%  Weight:   83 kg (182 lb 15.7 oz)   Height:        Intake/Output Summary (Last 24 hours) at 05/20/16 1308 Last data filed at 05/20/16 1020  Gross per 24 hour  Intake              350 ml  Output              150 ml  Net              200 ml   Filed Weights   05/18/16 2050 05/20/16 0818 05/20/16 1218  Weight: 82 kg (180 lb 12.4 oz) 85.5 kg (188 lb 7.9 oz) 83 kg (182 lb 15.7 oz)   Examination:  Vitals:   05/20/16 1130 05/20/16 1200 05/20/16 1218 05/20/16 1306  BP: (!) 162/100 (!) 166/94 (!) 166/96 (!) 175/85  Pulse: 86 84 82 88  Resp: 20 20 20 18   Temp:   98 F (36.7 C) 98.4 F (36.9 C)  TempSrc:   Oral Oral  SpO2:   99% 100%  Weight:   83 kg (182 lb 15.7 oz)   Height:       Eyes: PERRL ENMT:  Mucous membranes are moist.  Respiratory: bibasial crackles, no wheezing. Cardiovascular: irregular, tachycardic. Regular rate and rhythm, no murmurs / rubs / gallops. Abdomen: no tenderness. Bowel sounds positive.  Neurologic: more alert, sat in the chair   Data Reviewed: I have personally reviewed following labs and imaging studies  CBC:  Recent Labs Lab 05/16/16 0130 05/17/16 0729 05/18/16 0341 05/19/16 0407 05/20/16 0607  WBC 6.6 5.5 6.2 5.4 5.4  HGB 8.2* 8.1* 8.0* 7.3* 8.3*  HCT 25.5* 25.4* 25.1* 22.9* 26.2*  MCV 92.4 93.7 93.7 94.2 93.6  PLT 198 198 206 173 [id number]   Basic Metabolic Panel:  Recent Labs Lab 05/15/16 0650 05/16/16 0130 05/17/16 0729 05/18/16 0341 05/19/16 0407 05/20/16 0607  NA 134* 138 137 137 136 135  K 3.8 4.1 4.1 3.7 3.8 4.3  CL 104 106 103 102 101 100*  CO2 20* 24 25 26 25 24   GLUCOSE 101* 108* 98 93 135* 94  BUN 57* 29* 43* 29* 42* 52*  CREATININE 8.83* 6.43* 8.94* 6.77* 8.87* 10.44*  CALCIUM 7.2* 7.6* 8.0* 8.2* 8.1* 8.6*  PHOS 4.3 2.6 4.0  --  4.0 4.8*   Liver Function Tests:  Recent Labs Lab 05/15/16 0650 05/16/16 0130 05/17/16 0729 05/19/16 0407 05/20/16 0607  ALBUMIN 2.7* 2.9* 2.8* 2.6* 2.8*   Coagulation Profile: No results for input(s): INR, PROTIME in the last 168 hours. Cardiac Enzymes:  Recent Labs Lab 05/15/16 1047 05/15/16 1948 05/16/16 0130 05/16/16 0755  TROPONINI 1.25* 1.30* 1.11* 1.30*   Urine analysis:    Component Value Date/Time   COLORURINE YELLOW 05/12/2016 0834   APPEARANCEUR CLOUDY (A) 05/12/2016 0834   LABSPEC 1.014 05/12/2016 0834   PHURINE 5.0 05/12/2016 0834   GLUCOSEU NEGATIVE 05/12/2016 0834   HGBUR MODERATE (A) 05/12/2016 0834   BILIRUBINUR NEGATIVE 05/12/2016 0834   KETONESUR NEGATIVE 05/12/2016 0834   PROTEINUR 100 (A) 05/12/2016 0834   UROBILINOGEN 0.2 12/09/2013 0013   NITRITE NEGATIVE 05/12/2016 0834   LEUKOCYTESUR NEGATIVE 05/12/2016 0834   Recent Results (from the past 240  hour(s))  Urine culture     Status: Abnormal   Collection Time: 05/12/16  8:24 AM  Result Value Ref Range Status   Specimen Description URINE, CLEAN CATCH  Final   Special Requests NONE  Final   Culture (A)  Final    7,000 COLONIES/mL INSIGNIFICANT GROWTH Performed at [facility]    Report Status 05/13/2016 FINAL  Final    Radiology Studies: No results found.  Scheduled Meds: . doxercalciferol      .  carvedilol  6.25 mg Oral BID WC  . darbepoetin (ARANESP) injection - DIALYSIS  60 mcg Intravenous Q Sat-HD  . doxercalciferol  2 mcg Intravenous Q T,Th,Sa-HD  . folic acid  1 mg Oral Daily  . heparin  5,000 Units Subcutaneous Q8H  . isosorbide-hydrALAZINE  1 tablet Oral TID  . multivitamin with minerals  1 tablet Oral Daily  . sodium chloride flush  3 mL Intravenous Q12H  . thiamine  100 mg Oral Daily   Continuous Infusions: . sodium chloride 10 mL/hr at 05/14/16 1157   Faye Ramsay, MD  [facility] Pager [phone]  If 7PM-7AM, please contact night-coverage [email or web address] Password [email or web address] 05/20/2016, 1:08 PM

## 2016-05-20 NOTE — Progress Notes (Addendum)
Kenneth [name] Progress Note    Subjective: "I'm OK". On HD tolerating well, oriented to person -thinks it's Wednesday-knows he is in [place] in the "[employer].  No visible tremors, calm.      Objective Vitals:   05/20/16 0818 05/20/16 0823 05/20/16 0900 05/20/16 0930  BP: (!) 154/98 (!) 154/97 (!) 150/96 (!) 151/96  Pulse: 90 78 85 89  Resp: 20 17 18 14   Temp: 98.4 F (36.9 C)     TempSrc: Oral     SpO2: 96%     Weight: 85.5 kg (188 lb 7.9 oz)     Height:       General: chronically ill appearing male, NAD Heart: S1,S2, regularly irregular. AFib on monitor.  Lungs: BBS dec in bases posteriorly  CTA Abdomen: soft, nontender Extremities: No LE edema Dialysis Access: New LUA AVF + bruit. RIJ TDC Drsg intact-HD lines connected. Pressures OK   Additional Objective Labs: Basic Metabolic Panel:  Recent Labs Lab 05/17/16 0729 05/18/16 0341 05/19/16 0407 05/20/16 0607  NA 137 137 136 135  K 4.1 3.7 3.8 4.3  CL 103 102 101 100*  CO2 25 26 25 24   GLUCOSE 98 93 135* 94  BUN 43* 29* 42* 52*  CREATININE 8.94* 6.77* 8.87* 10.44*  CALCIUM 8.0* 8.2* 8.1* 8.6*  PHOS 4.0  --  4.0 4.8*   Liver Function Tests:  Recent Labs Lab 05/17/16 0729 05/19/16 0407 05/20/16 0607  ALBUMIN 2.8* 2.6* 2.8*   No results for input(s): LIPASE, AMYLASE in the last 168 hours. CBC:  Recent Labs Lab 05/16/16 0130 05/17/16 0729 05/18/16 0341 05/19/16 0407 05/20/16 0607  WBC 6.6 5.5 6.2 5.4 5.4  HGB 8.2* 8.1* 8.0* 7.3* 8.3*  HCT 25.5* 25.4* 25.1* 22.9* 26.2*  MCV 92.4 93.7 93.7 94.2 93.6  PLT 198 198 206 173 211   Blood Culture    Component Value Date/Time   SDES URINE, CLEAN CATCH 05/12/2016 0824   SPECREQUEST NONE 05/12/2016 0824   CULT (A) 05/12/2016 0824    7,000 COLONIES/mL INSIGNIFICANT GROWTH Performed at [place] 05/13/2016 FINAL 05/12/2016 0824    Cardiac Enzymes:  Recent Labs Lab 05/15/16 1047 05/15/16 1948  05/16/16 0130 05/16/16 0755  TROPONINI 1.25* 1.30* 1.11* 1.30*   CBG:  Recent Labs Lab 05/14/16 1707 05/14/16 1747 05/15/16 0943  GLUCAP 121* 112* 134*   Iron Studies: No results for input(s): IRON, TIBC, TRANSFERRIN, FERRITIN in the last 72 hours. @lablastinr3 @ Studies/Results: No results found. Medications: . sodium chloride 10 mL/hr at 05/14/16 1157   . carvedilol  6.25 mg Oral BID WC  . darbepoetin (ARANESP) injection - DIALYSIS  60 mcg Intravenous Q Sat-HD  . doxercalciferol  2 mcg Intravenous Q T,Th,Sa-HD  . folic acid  1 mg Oral Daily  . heparin  5,000 Units Subcutaneous Q8H  . isosorbide-hydrALAZINE  1 tablet Oral TID  . multivitamin with minerals  1 tablet Oral Daily  . sodium chloride flush  3 mL Intravenous Q12H  . thiamine  100 mg Oral Daily   1. Acute delirium: Suspected to be related to alcohol withdrawal versus medication induced. More interactive today, oriented X 2.   2. Afib/Aflutter: per primary. Rate controlled. No anticoagulation.  3. ESRD: With progression of chronic kidney disease stage V from underlying hypertension/NSAIDs now to ESRD. Initiated on hemodialysis and now status post placement of left basilic vein transposition fistula on 05/14/16 as we use right IJ [facility] for dialysis. Clip process in progress  for OP HD center. Will go to [facility] TTS 2nd shift. K+ 3.8 4.0 K bath.  4. Anemia: HGB ^ to 8.3. Hold transfusion. Cont ESA. Check Iron studies.  5. CKD-MBD: Low phosphorus-likely secondary to chronic malnutrition/poor intake with alcoholism. On low-dose vitamin D receptor analogue. Ca+ 8.1 C Ca 9.22 Use 2.25 Ca bath.  6. Nutrition: Albumin 2.8 Renal diet-add prostat/Renal bit. Chronic malnutrition secondary to alcoholism/impaired caloric intake. On thiamine.  7. Hypertension/Volume: On HD. Pre wt 85.5 kg BP 151/96. UFG 1500 will challenge 500cc today.  8. Acute on Chronic combined systolic/diastolic HF with pulmonary hypertension:  On coreg, hydralazine. Per  primary 9.. ETOH abuse - says he quit, "2 wks ago". No evidence of acute withdrawal at present.   Rita H. Brown NP-C 05/20/2016, 10:25 AM  [name] [phone]  Pt seen, examined and agree w A/P as above.  Oriented x 3 w minimal coaching today.  Missed PT visit for HD this am and refused yesterday.  He is ready for dc from renal standpoint, has OP HD scheduled for TTS, not sure if he is medically/ physically ready yet, defer to primary team.  Will follow.  Unable to reach daughter by phone, will try again later.  Kelly Splinter MD [employer] pager [phone]    cell [phone] 05/20/2016, 12:53 PM

## 2016-05-21 LAB — CBC
HEMATOCRIT: 24.8 % — AB (ref 39.0–52.0)
Hemoglobin: 7.9 g/dL — ABNORMAL LOW (ref 13.0–17.0)
MCH: 29.9 pg (ref 26.0–34.0)
MCHC: 31.9 g/dL (ref 30.0–36.0)
MCV: 93.9 fL (ref 78.0–100.0)
Platelets: 213 10*3/uL (ref 150–400)
RBC: 2.64 MIL/uL — AB (ref 4.22–5.81)
RDW: 17.1 % — ABNORMAL HIGH (ref 11.5–15.5)
WBC: 5.4 10*3/uL (ref 4.0–10.5)

## 2016-05-21 LAB — RENAL FUNCTION PANEL
Albumin: 2.6 g/dL — ABNORMAL LOW (ref 3.5–5.0)
Anion gap: 8 (ref 5–15)
BUN: 26 mg/dL — ABNORMAL HIGH (ref 6–20)
CHLORIDE: 101 mmol/L (ref 101–111)
CO2: 27 mmol/L (ref 22–32)
Calcium: 8.3 mg/dL — ABNORMAL LOW (ref 8.9–10.3)
Creatinine, Ser: 6.68 mg/dL — ABNORMAL HIGH (ref 0.61–1.24)
GFR, EST AFRICAN AMERICAN: 9 mL/min — AB (ref >60)
GFR, EST NON AFRICAN AMERICAN: 8 mL/min — AB (ref >60)
Glucose, Bld: 89 mg/dL (ref 65–99)
POTASSIUM: 3.9 mmol/L (ref 3.5–5.1)
Phosphorus: 3.5 mg/dL (ref 2.5–4.6)
Sodium: 136 mmol/L (ref 135–145)

## 2016-05-21 MED ORDER — POLYETHYLENE GLYCOL 3350 17 G PO PACK: 17.0000 g | PACK | Freq: Every day | ORAL | Status: DC | PRN

## 2016-05-21 MED ORDER — SENNOSIDES-DOCUSATE SODIUM 8.6-50 MG PO TABS
2.0000 | ORAL_TABLET | Freq: Every evening | ORAL | Status: DC | PRN
  Administered 2016-05-21: 2 via ORAL
  Filled 2016-05-21: qty 2

## 2016-05-21 MED ORDER — ISOSORB DINITRATE-HYDRALAZINE 20-37.5 MG PO TABS: 1.0000 | ORAL_TABLET | Freq: Three times a day (TID) | ORAL | 0 refills | Status: DC

## 2016-05-21 MED ORDER — ADULT MULTIVITAMIN W/MINERALS CH: 1.0000 | ORAL_TABLET | Freq: Every day | ORAL | Status: DC

## 2016-05-21 MED ORDER — IPRATROPIUM-ALBUTEROL 0.5-2.5 (3) MG/3ML IN SOLN: 3.0000 mL | RESPIRATORY_TRACT | 1 refills | Status: DC | PRN

## 2016-05-21 NOTE — Progress Notes (Signed)
[facility] Progress Note    Subjective: "I'm doing OK-I walked to the elevator". Up in chair, looks much better. Oriented to person and place, off 1 day on date. Says his appetite is good, no C/Os.   Objective Vitals:   05/20/16 1614 05/20/16 2006 05/21/16 0411 05/21/16 0900  BP: 125/69 128/74 (!) 139/31 (!) 114/57  Pulse: 86 78 84 96  Resp: 17 20 19 18   Temp: 98 F (36.7 C) 98.6 F (37 C) 98.9 F (37.2 C) 99.2 F (37.3 C)  TempSrc: Oral Oral Oral Oral  SpO2: 98% 98% 98% 97%  Weight:  85.6 kg (188 lb 11.4 oz)    Height:       Physical Exam General: More alert and interactive. NAD Heart: S1,S2, regularly irregular. AFib on monitor.  Lungs: BBS dec in bases posteriorly CTA Abdomen: soft, nontender Extremities:No LE edema Dialysis Access: New LUA AVF + bruit. RIJ TDC Drsg intact   Additional Objective Labs: Basic Metabolic Panel:  Recent Labs Lab 05/19/16 0407 05/20/16 0607 05/21/16 0337  NA 136 135 136  K 3.8 4.3 3.9  CL 101 100* 101  CO2 25 24 27   GLUCOSE 135* 94 89  BUN 42* 52* 26*  CREATININE 8.87* 10.44* 6.68*  CALCIUM 8.1* 8.6* 8.3*  PHOS 4.0 4.8* 3.5   Liver Function Tests:  Recent Labs Lab 05/19/16 0407 05/20/16 0607 05/21/16 0337  ALBUMIN 2.6* 2.8* 2.6*   CBC:  Recent Labs Lab 05/17/16 0729 05/18/16 0341 05/19/16 0407 05/20/16 0607 05/21/16 0337  WBC 5.5 6.2 5.4 5.4 5.4  HGB 8.1* 8.0* 7.3* 8.3* 7.9*  HCT 25.4* 25.1* 22.9* 26.2* 24.8*  MCV 93.7 93.7 94.2 93.6 93.9  PLT 198 206 173 211 213   Blood Culture    Component Value Date/Time   SDES URINE, CLEAN CATCH 05/12/2016 0824   SPECREQUEST NONE 05/12/2016 0824   CULT (A) 05/12/2016 0824    7,000 COLONIES/mL INSIGNIFICANT GROWTH Performed at [place] 05/13/2016 FINAL 05/12/2016 0824    Cardiac Enzymes:  Recent Labs Lab 05/15/16 1047 05/15/16 1948 05/16/16 0130 05/16/16 0755  TROPONINI 1.25* 1.30* 1.11* 1.30*   CBG:  Recent  Labs Lab 05/14/16 1707 05/14/16 1747 05/15/16 0943  GLUCAP 121* 112* 134*   Iron Studies: No results for input(s): IRON, TIBC, TRANSFERRIN, FERRITIN in the last 72 hours. @lablastinr3 @ Studies/Results: No results found. Medications: . sodium chloride 10 mL/hr at 05/14/16 1157   . carvedilol  6.25 mg Oral BID WC  . darbepoetin (ARANESP) injection - DIALYSIS  60 mcg Intravenous Q Sat-HD  . doxercalciferol  2 mcg Intravenous Q T,Th,Sa-HD  . folic acid  1 mg Oral Daily  . heparin  5,000 Units Subcutaneous Q8H  . isosorbide-hydrALAZINE  1 tablet Oral TID  . multivitamin with minerals  1 tablet Oral Daily  . sodium chloride flush  3 mL Intravenous Q12H  . thiamine  100 mg Oral Daily     Assessment/Plan: 1. Acute delirium: Suspected to be related to alcohol withdrawal versus medication induced. More interactive today, oriented X 3 with prompting.   2. Afib/Aflutter:per primary. Rate controlled. No anticoagulation.  3.ESRD: With progression of chronic kidney disease stage V from underlying hypertension/NSAIDs now to ESRD. Initiated on hemodialysis and now status post placement of left basilic vein transposition fistula on 05/14/16 as we use right IJ [facility] for dialysis. Clip process in progress for OP HD center.Will go to [facility] TTS 2nd shift.Next HD tomorrow. K+ 3.9. 4.0 K  bath.  4.Anemia: HGB 7.9 today. Cont ESA. Next dose 07/29/17Check Iron studies.  5. CKD-MBD: Low phosphorus-likely secondary to chronic malnutrition/poor intake with alcoholism. On low-dose vitamin D receptor analogue. Ca+ 8.1 C Ca 9.22 Use 2.25 Ca bath.  6. Nutrition: Albumin 2.6 Renal diet-add prostat/Renal bit. Chronic malnutrition secondary to alcoholism/impaired caloric intake. On thiamine.  7.Hypertension/Volume: Better control of BP today. HD yesterday Pre wt 85.5 Net UF not documented, post wt 83 kg. Wt today 85.6. ?? Accuracy-probably bed wt. UFG 1.5-2 liters tomorrow.  8. Acute on Chronic combined  systolic/diastolic HF with pulmonary hypertension: On coreg, hydralazine. Per primary 9.. ETOH abuse - says he quit, "2 wks ago". No evidence of acute withdrawal at present.   Rita H. Brown NP-C 05/21/2016, 11:35 AM  [facility] [phone]  Pt seen, examined and agree w A/P as above.  Kelly Splinter MD [employer] pager [phone]    cell [phone] 05/21/2016, 1:30 PM

## 2016-05-21 NOTE — Care Management Note (Signed)
Case Management Note  Patient Details  Name: Kenneth [name] MRN: [id number] Date of Birth: 08/25/1956  Subjective/Objective:     CM following for progression and d/c planning.                Action/Plan: 05/21/2016 Met with pt daughter on 05/20/2016 she states that they will be able to provide transportation to HD for this pt. Medicaid application in process due to need for acute HD. She will assist pt in Disability application when upon d/c. This CM also advised that they apply of SCAT services. Today this CM attempted to arrange HH services for this pt with [facility] for HHRN and HHPT, however the pt daughter does not feel that this is needed.   Expected Discharge Date:     05/21/2016            Expected Discharge Plan:  Home/Self Care  In-House Referral:  NA  Discharge planning Services  CM Consult, MATCH Program  Post Acute Care Choice:    Choice offered to:  Adult Children  DME Arranged:  Walker rolling with seat DME Agency:  [facility]  HH Arranged:   Declined by pt daughter. HH Agency:     Status of Service:  Completed, signed off  If discussed at Long Length of Stay Meetings, dates discussed:    Additional Comments:  ,  U, RN 05/21/2016, 4:33 PM  

## 2016-05-21 NOTE — Discharge Summary (Signed)
Physician Discharge Summary  Kenneth [name] [id number] DOB: 1956/04/13 DOA: 05/12/2016  PCP: [name], NP (Inactive)  Admit date: 05/12/2016 Discharge date: 05/22/2016  Recommendations for Outpatient Follow-up:  1. Pt will need to follow up with PCP in 2-3 weeks post discharge 2. Will go to [facility] TTS 2nd shift  Discharge Diagnoses:  Active Problems:   ESRD on HD TTS  Discharge Condition: Stable  Diet recommendation: Renal diet   Brief Narrative: 60 y.o. male with history of hypertension, hyperlipidemia, combined congestive heart failure, ejection fraction 30-35% with grade 3 diastolic dysfunction, chronic kidney disease stage 3-4, medical noncompliance and alcohol abuse presented to [facility] ED due to c/o upper back pain for the last two weeks. He was found to be in [facility] renal failure and transferred to [facility] for renal consult and HD initiation.   Assessment & Plan:  Acute encephalopathy 7/20 - unclear if related to medications vs alcohol withdrawal  - resolved  - CT head with no evidence of stroke  AKI on CKD, stage V - Progressive CKD now stage 5 due to longstanding, poorly controlled HTN as well as NSAIDs - nephrology following, appreciate assistance  - VVS consulted for placement of HD cath and AVF/AVG, appreciate assistance  - Initiated on hemodialysis and now status post placement of left basilic vein transposition fistula on 05/14/16 as we use right IJ TDC for dialysis  - so far tolerating HD, outpatient HD set up - pt and family agreeable to going home, they can provide transportation to HD center   A fib / A flutter with variable conduction - patient's CHA2DS2-VASc Score for Stroke Risk is 4.  - Cardiology consulted, signed off as no further recommendations, no indications for any interventions  - troponins remain elevated, no need to check again as pt with no chest pain   Chest pain  - cardiology consulted, appreciate assistance, no further recommendations, signed  off - no chest pain this AM  Acute on chronic combined systolic and diastolic CHF with new pulmonary hypertension - Last Echo in 2015 showed an EF of 55-60 %, when he was last admitted for acute on chronic combined CHF.  - Repeat this admission shows decrease to 25-30% with diffuse hypokinesis, with PA pressure of 78mmHg.  - on coreg, isosorbide - hydralazine   Alcohol abuse - elevated LFTs c/w ETOH - has been off CIWA as pt with no withdrawals at this time   Anemia of Chronic disease, CKD - no signs of bleeding but Hg is down, plan to transfuse with next HD session   Hypertensive urgency not present on admission  - on coreg, isosorbide - hydralazine  - SBP in 130's this AM - continue same regimen for now   DVT prophylaxis: heparin Code Status: Full Family Communication: no family bedside Disposition Plan: home in am  Consultants:   Nephrology   Cardiology  Vascular surgery   Procedures:   2D echo:-When compared to the prior study from 12/09/2013 LVEF has decreased, previously 60-65%, now 25-30% with diffuse hypokinesis. There is also a moderate RV systolic dysfunction. Severe pulmonary hypertension is new.  Antimicrobials:  None    Procedures/Studies: Dg Chest 2 View  Result Date: 05/12/2016 CLINICAL DATA:  Cough and congestion with shortness of breath for 1 week EXAM: CHEST  2 VIEW COMPARISON:  December 08, 2013 FINDINGS: There is no edema or consolidation. There is cardiomegaly with pulmonary vascularity within normal limits. No adenopathy. There is atherosclerotic calcification in the aorta. No bone lesions are  evident. IMPRESSION: Cardiomegaly. No edema or consolidation. There is aortic atherosclerosis. Electronically Signed   By: Lowella Grip III M.D.   On: 05/12/2016 07:57   Dg Thoracic Spine 2 View  Result Date: 05/12/2016 CLINICAL DATA:  Upper back pain starting last Thursday EXAM: THORACIC SPINE 2 VIEWS COMPARISON:  12/08/2013 FINDINGS:  Cardiomegaly is noted. No thoracic spine acute fracture or subluxation. Alignment and vertebral body heights are preserved. IMPRESSION: Negative. Electronically Signed   By: Lahoma Crocker M.D.   On: 05/12/2016 11:16   Ct Head Wo Contrast  Result Date: 05/15/2016 CLINICAL DATA:  Lethargy beginning this morning. EXAM: CT HEAD WITHOUT CONTRAST TECHNIQUE: Contiguous axial images were obtained from the base of the skull through the vertex without intravenous contrast. COMPARISON:  None. FINDINGS: The brain shows advanced generalized atrophy. There chronic small-vessel ischemic changes throughout the deep and subcortical white matter. Old lacunar infarction right basal ganglia. No sign of acute infarction, mass lesion, hemorrhage, hydrocephalus or extra-axial collection. Sinuses, middle ears and mastoids are clear. No calvarial abnormality. There is atherosclerotic calcification of the major vessels at the base of the brain. IMPRESSION: No acute finding.  Advanced atrophy and chronic ischemic changes. Electronically Signed   By: Nelson Chimes M.D.   On: 05/15/2016 10:36   US Renal  Result Date: 05/12/2016 CLINICAL DATA:  Elevated creatinine in, chronic kidney disease stage 4 EXAM: RENAL / URINARY TRACT ULTRASOUND COMPLETE COMPARISON:  09/21/2011 FINDINGS: Right Kidney: Length: 8.4 cm. No hydronephrosis or renal calculi. Small perinephric fluid. Left Kidney: Length: 8.5 cm. No hydronephrosis or renal calculi. Small perinephric fluid. Bilateral kidney shows diffuse increase echogenicity consistent with medical renal disease. Bladder: Appears normal for degree of bladder distention. Small perisplenic ascites. IMPRESSION: 1. Bilateral mild atrophic kidneys with increased cortical echogenicity suspicious for medical renal disease. Small perinephric fluid bilaterally. No hydronephrosis or renal calculi. Unremarkable urinary bladder. Electronically Signed   By: Lahoma Crocker M.D.   On: 05/12/2016 11:19   Dg Chest Port 1  View  Result Date: 05/15/2016 CLINICAL DATA:  Lethargy.  Chronic renal disease. EXAM: PORTABLE CHEST 1 VIEW COMPARISON:  PA and lateral chest 05/12/2016. Single view of the chest 12/08/2014. FINDINGS: There is marked cardiomegaly with pulmonary vascular congestion. The patient has new small to moderate bilateral pleural effusions with basilar airspace disease, likely atelectasis. No pneumothorax. IMPRESSION: New small to moderate bilateral pleural effusions and basilar atelectasis since the most recent exam. Cardiomegaly and pulmonary vascular congestion. Electronically Signed   By: Inge Rise M.D.   On: 05/15/2016 10:31    Discharge Exam: Vitals:   05/21/16 0411 05/21/16 0900  BP: (!) 139/31 (!) 114/57  Pulse: 84 96  Resp: 19 18  Temp: 98.9 F (37.2 C) 99.2 F (37.3 C)   Vitals:   05/20/16 1614 05/20/16 2006 05/21/16 0411 05/21/16 0900  BP: 125/69 128/74 (!) 139/31 (!) 114/57  Pulse: 86 78 84 96  Resp: 17 20 19 18   Temp: 98 F (36.7 C) 98.6 F (37 C) 98.9 F (37.2 C) 99.2 F (37.3 C)  TempSrc: Oral Oral Oral Oral  SpO2: 98% 98% 98% 97%  Weight:  85.6 kg (188 lb 11.4 oz)    Height:        General: Pt is alert, follows commands appropriately, not in acute distress Cardiovascular: Regular rate and rhythm, S1/S2 +, no rubs, no gallops Respiratory: Clear to auscultation bilaterally, no wheezing, no crackles, no rhonchi Abdominal: Soft, non tender, non distended, bowel sounds +, no guarding  Discharge Instructions     Medication List    STOP taking these medications   amLODipine 10 MG tablet Commonly known as:  NORVASC   furosemide 40 MG tablet Commonly known as:  LASIX   hydrALAZINE 50 MG tablet Commonly known as:  APRESOLINE   naproxen sodium 220 MG tablet Commonly known as:  ANAPROX     TAKE these medications   carvedilol 25 MG tablet Commonly known as:  COREG Take 1 tablet (25 mg total) by mouth 2 (two) times daily with a meal.    ipratropium-albuterol 0.5-2.5 (3) MG/3ML Soln Commonly known as:  DUONEB Take 3 mLs by nebulization every 4 (four) hours as needed.   isosorbide-hydrALAZINE 20-37.5 MG tablet Commonly known as:  BIDIL Take 1 tablet by mouth 3 (three) times daily.   multivitamin with minerals Tabs tablet Take 1 tablet by mouth daily.       Follow-up Information    Early, Todd, MD Follow up in 4 week(s).   Specialties:  Vascular Surgery, Cardiology Why:  Office will call you to arrange your appt (sent) Contact information: [place] [address] [phone]        MABE,DAVID, NP .   Specialty:  Family Medicine Contact information: [address] [place] [address] [phone]            The results of significant diagnostics from this hospitalization (including imaging, microbiology, ancillary and laboratory) are listed below for reference.     Microbiology: Recent Results (from the past 240 hour(s))  Urine culture     Status: Abnormal   Collection Time: 05/12/16  8:24 AM  Result Value Ref Range Status   Specimen Description URINE, CLEAN CATCH  Final   Special Requests NONE  Final   Culture (A)  Final    7,000 COLONIES/mL INSIGNIFICANT GROWTH Performed at [facility]    Report Status 05/13/2016 FINAL  Final     Labs: Basic Metabolic Panel:  Recent Labs Lab 05/16/16 0130 05/17/16 0729 05/18/16 0341 05/19/16 0407 05/20/16 0607 05/21/16 0337  NA 138 137 137 136 135 136  K 4.1 4.1 3.7 3.8 4.3 3.9  CL 106 103 102 101 100* 101  CO2 24 25 26 25 24 27   GLUCOSE 108* 98 93 135* 94 89  BUN 29* 43* 29* 42* 52* 26*  CREATININE 6.43* 8.94* 6.77* 8.87* 10.44* 6.68*  CALCIUM 7.6* 8.0* 8.2* 8.1* 8.6* 8.3*  PHOS 2.6 4.0  --  4.0 4.8* 3.5   Liver Function Tests:  Recent Labs Lab 05/16/16 0130 05/17/16 0729 05/19/16 0407 05/20/16 0607 05/21/16 0337  ALBUMIN 2.9* 2.8* 2.6* 2.8* 2.6*   CBC:  Recent Labs Lab 05/17/16 0729  05/18/16 0341 05/19/16 0407 05/20/16 0607 05/21/16 0337  WBC 5.5 6.2 5.4 5.4 5.4  HGB 8.1* 8.0* 7.3* 8.3* 7.9*  HCT 25.4* 25.1* 22.9* 26.2* 24.8*  MCV 93.7 93.7 94.2 93.6 93.9  PLT 198 206 173 211 213   Cardiac Enzymes:  Recent Labs Lab 05/15/16 1047 05/15/16 1948 05/16/16 0130 05/16/16 0755  TROPONINI 1.25* 1.30* 1.11* 1.30*   BNP: BNP (last 3 results)  Recent Labs  05/12/16 0736  BNP 2,191.8*   CBG:  Recent Labs Lab 05/14/16 1707 05/14/16 1747 05/15/16 0943  GLUCAP 121* 112* 134*   SIGNED: Time coordinating discharge: 30 minutes  Faye Ramsay, MD  [facility] 05/21/2016, 11:03 AM Pager [phone]  If 7PM-7AM, please contact night-coverage [email or web address] Password [email or web address]

## 2016-05-21 NOTE — Progress Notes (Signed)
Physical Therapy Treatment Patient Details Name: Kenneth [name] MRN: [id number] DOB: 01/22/1956 Today's Date: 05/21/2016    History of Present Illness Patient is a 60 y/o male with hx of HTN, alcohol abuse, CKD, CHF with ejection fraction 30-35% with grade 3 diastolic dysfunction, presents with upper back pain, found to be in renal failure and initiated on HD.cs/p left BVT.    PT Comments    Patient progressing slowly towards PT goals. Continues to have some difficulty navigating RW during gait training. Will attempt gait training next session without AD as pt was independent PTA. Tolerated stair training but required Mod A on descent due to partial knee buckling. Marked weakness noted. Continues to be confused continually asking, "in [place]." Pt will need assist with mobility/safety at home as he is a high fall risk. Will follow.  Follow Up Recommendations  Home health PT;Supervision for mobility/OOB;Supervision/Assistance - 24 hour     Equipment Recommendations  Rolling walker with 5" wheels    Recommendations for Other Services       Precautions / Restrictions Precautions Precautions: Fall Restrictions Weight Bearing Restrictions: No    Mobility  Bed Mobility Overal bed mobility: Needs Assistance Bed Mobility: Supine to Sit     Supine to sit: Modified independent (Device/Increase time)     General bed mobility comments: HOB flat, use of rail for assist. No physical assist needed.  Transfers Overall transfer level: Needs assistance Equipment used: Rolling walker (2 wheeled) Transfers: Sit to/from Stand Sit to Stand: Min guard         General transfer comment: Min guard for safety. 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 Transferred to chair post ambulation bout.  Ambulation/Gait Ambulation/Gait assistance: Min guard Ambulation Distance (Feet): 150 Feet (+ 75') Assistive device: Rolling walker (2 wheeled) Gait Pattern/deviations: Step-through pattern;Decreased stride  length;Trunk flexed   Gait velocity interpretation: Below normal speed for age/gender General Gait Details: Slow, steady gait. Cues for RW management esp with turns and to stay within walker. HR up to 125 bpm. Pt in A-fib. Ambulated within room without RW- reaching for furniture but no LOB.   Stairs Stairs: Yes Stairs assistance: Mod assist Stair Management: Step to pattern;Alternating pattern;One rail Right Number of Stairs: 4 General stair comments: Cues for technique and safety. Mod A to descend steps due to weakness and partial knee buckling upon descent with both LEs. Able to perform alternating step pattern to pattern ascend steps. Fatigues.  Wheelchair Mobility    Modified Rankin (Stroke Patients Only)       Balance Overall balance assessment: Needs assistance Sitting-balance support: Feet supported;No upper extremity supported Sitting balance-Leahy Scale: Good     Standing balance support: During functional activity Standing balance-Leahy Scale: Fair                      Cognition Arousal/Alertness: Awake/alert Behavior During Therapy: Flat affect Overall Cognitive Status: No family/caregiver present to determine baseline cognitive functioning (Continues to ask, "my home in [place].")       Memory: Decreased short-term memory              Exercises      General Comments        Pertinent Vitals/Pain Pain Assessment: No/denies pain    Home Living                      Prior Function            PT Goals (current goals can  now be found in the care plan section) Progress towards PT goals: Progressing toward goals    Frequency  Min 3X/week    PT Plan Current plan remains appropriate    Co-evaluation             End of Session Equipment Utilized During Treatment: Gait belt Activity Tolerance: Patient limited by fatigue Patient left: in chair;with call bell/phone within reach;with chair alarm set     Time:  [id number] PT Time Calculation (min) (ACUTE ONLY): 17 min  Charges:  $Gait Training: 8-22 mins                    G Codes:      Karynn Deblasi A Ahtziri Jeffries 05/21/2016, 11:53 AM Wray Kearns, Ray, DPT [phone]

## 2016-05-22 LAB — RENAL FUNCTION PANEL
ANION GAP: 10 (ref 5–15)
Albumin: 2.6 g/dL — ABNORMAL LOW (ref 3.5–5.0)
BUN: 34 mg/dL — AB (ref 6–20)
CHLORIDE: 100 mmol/L — AB (ref 101–111)
CO2: 26 mmol/L (ref 22–32)
Calcium: 8.7 mg/dL — ABNORMAL LOW (ref 8.9–10.3)
Creatinine, Ser: 8.74 mg/dL — ABNORMAL HIGH (ref 0.61–1.24)
GFR calc Af Amer: 7 mL/min — ABNORMAL LOW (ref >60)
GFR calc non Af Amer: 6 mL/min — ABNORMAL LOW (ref >60)
GLUCOSE: 85 mg/dL (ref 65–99)
POTASSIUM: 3.9 mmol/L (ref 3.5–5.1)
Phosphorus: 4.6 mg/dL (ref 2.5–4.6)
Sodium: 136 mmol/L (ref 135–145)

## 2016-05-22 LAB — CBC
HEMATOCRIT: 23.2 % — AB (ref 39.0–52.0)
HEMOGLOBIN: 7.6 g/dL — AB (ref 13.0–17.0)
MCH: 30.5 pg (ref 26.0–34.0)
MCHC: 32.8 g/dL (ref 30.0–36.0)
MCV: 93.2 fL (ref 78.0–100.0)
Platelets: 192 10*3/uL (ref 150–400)
RBC: 2.49 MIL/uL — ABNORMAL LOW (ref 4.22–5.81)
RDW: 17.1 % — ABNORMAL HIGH (ref 11.5–15.5)
WBC: 6.2 10*3/uL (ref 4.0–10.5)

## 2016-05-22 LAB — IRON AND TIBC
Iron: 51 ug/dL (ref 45–182)
Saturation Ratios: 19 % (ref 17.9–39.5)
TIBC: 262 ug/dL (ref 250–450)
UIBC: 211 ug/dL

## 2016-05-22 LAB — FERRITIN: Ferritin: 113 ng/mL (ref 24–336)

## 2016-05-22 MED ORDER — DOXERCALCIFEROL 4 MCG/2ML IV SOLN
INTRAVENOUS | Status: AC
  Administered 2016-05-22: 2 ug via INTRAVENOUS
  Filled 2016-05-22: qty 2

## 2016-05-22 MED ORDER — HEPARIN SODIUM (PORCINE) 1000 UNIT/ML DIALYSIS: 20.0000 [IU]/kg | INTRAMUSCULAR | Status: DC | PRN

## 2016-05-22 MED ORDER — SODIUM CHLORIDE 0.9 % IV SOLN: 100.0000 mL | INTRAVENOUS | Status: DC | PRN

## 2016-05-22 MED ORDER — LIDOCAINE HCL (PF) 1 % IJ SOLN: 5.0000 mL | INTRAMUSCULAR | Status: DC | PRN

## 2016-05-22 MED ORDER — LIDOCAINE-PRILOCAINE 2.5-2.5 % EX CREA: 1.0000 "application " | TOPICAL_CREAM | CUTANEOUS | Status: DC | PRN

## 2016-05-22 MED ORDER — ALTEPLASE 2 MG IJ SOLR: 2.0000 mg | Freq: Once | INTRAMUSCULAR | Status: DC | PRN

## 2016-05-22 MED ORDER — PENTAFLUOROPROP-TETRAFLUOROETH EX AERO: 1.0000 "application " | INHALATION_SPRAY | CUTANEOUS | Status: DC | PRN

## 2016-05-22 MED ORDER — HEPARIN SODIUM (PORCINE) 1000 UNIT/ML DIALYSIS: 1000.0000 [IU] | INTRAMUSCULAR | Status: DC | PRN

## 2016-05-22 NOTE — Progress Notes (Signed)
Patient in Hemodialysis.

## 2016-05-22 NOTE — Discharge Summary (Signed)
Physician Discharge Summary  Kenneth [name] [id number] DOB: 1956-02-21 DOA: 05/12/2016  PCP: [name], NP (Inactive)  Admit date: 05/12/2016 Discharge date: 05/22/2016  Recommendations for Outpatient Follow-up:  1. Pt will need to follow up with PCP in 2-3 weeks post discharge 2. Will go to [facility] TTS 2nd shift  Discharge Diagnoses:  Active Problems:   ESRD on HD TTS  Discharge Condition: Stable  Diet recommendation: Renal diet   Brief Narrative: 60 y.o. male with history of hypertension, hyperlipidemia, combined congestive heart failure, ejection fraction 30-35% with grade 3 diastolic dysfunction, chronic kidney disease stage 3-4, medical noncompliance and alcohol abuse presented to [facility] ED due to c/o upper back pain for the last two weeks. He was found to be in [facility] renal failure and transferred to [facility] for renal consult and HD initiation.   Assessment & Plan:  Acute encephalopathy 7/20 - unclear if related to medications vs alcohol withdrawal  - resolved  - CT head with no evidence of stroke  AKI on CKD, stage V - Progressive CKD now stage 5 due to longstanding, poorly controlled HTN as well as NSAIDs - nephrology following, appreciate assistance  - VVS consulted for placement of HD cath and AVF/AVG, appreciate assistance  - Initiated on hemodialysis and now status post placement of left basilic vein transposition fistula on 05/14/16 as we use right IJ TDC for dialysis  - so far tolerating HD, outpatient HD set up - pt and family agreeable to going home, they can provide transportation to HD center   A fib / A flutter with variable conduction - patient's CHA2DS2-VASc Score for Stroke Risk is 4.  - Cardiology consulted, signed off as no further recommendations, no indications for any interventions  - troponins remain elevated, no need to check again as pt with no chest pain   Chest pain  - cardiology consulted, appreciate assistance, no further recommendations, signed  off - no chest pain this AM  Acute on chronic combined systolic and diastolic CHF with new pulmonary hypertension - Last Echo in 2015 showed an EF of 55-60 %, when he was last admitted for acute on chronic combined CHF.  - Repeat this admission shows decrease to 25-30% with diffuse hypokinesis, with PA pressure of 31mmHg.  - on coreg, isosorbide - hydralazine   Alcohol abuse - elevated LFTs c/w ETOH - has been off CIWA as pt with no withdrawals at this time   Anemia of Chronic disease, CKD - no signs of bleeding but Hg is down, plan to transfuse with next HD session   Hypertensive urgency not present on admission  - on coreg, isosorbide - hydralazine  - SBP in 130's this AM - continue same regimen for now   DVT prophylaxis: heparin Code Status: Full Family Communication: no family bedside Disposition Plan: home   Consultants:   Nephrology   Cardiology  Vascular surgery   Procedures:   2D echo:-When compared to the prior study from 12/09/2013 LVEF has decreased, previously 60-65%, now 25-30% with diffuse hypokinesis. There is also a moderate RV systolic dysfunction. Severe pulmonary hypertension is new.  Antimicrobials:  None    Procedures/Studies: Dg Chest 2 View  Result Date: 05/12/2016 CLINICAL DATA:  Cough and congestion with shortness of breath for 1 week EXAM: CHEST  2 VIEW COMPARISON:  December 08, 2013 FINDINGS: There is no edema or consolidation. There is cardiomegaly with pulmonary vascularity within normal limits. No adenopathy. There is atherosclerotic calcification in the aorta. No bone lesions are evident.  IMPRESSION: Cardiomegaly. No edema or consolidation. There is aortic atherosclerosis. Electronically Signed   By: Lowella Grip III M.D.   On: 05/12/2016 07:57   Dg Thoracic Spine 2 View  Result Date: 05/12/2016 CLINICAL DATA:  Upper back pain starting last Thursday EXAM: THORACIC SPINE 2 VIEWS COMPARISON:  12/08/2013 FINDINGS: Cardiomegaly  is noted. No thoracic spine acute fracture or subluxation. Alignment and vertebral body heights are preserved. IMPRESSION: Negative. Electronically Signed   By: Lahoma Crocker M.D.   On: 05/12/2016 11:16   Ct Head Wo Contrast  Result Date: 05/15/2016 CLINICAL DATA:  Lethargy beginning this morning. EXAM: CT HEAD WITHOUT CONTRAST TECHNIQUE: Contiguous axial images were obtained from the base of the skull through the vertex without intravenous contrast. COMPARISON:  None. FINDINGS: The brain shows advanced generalized atrophy. There chronic small-vessel ischemic changes throughout the deep and subcortical white matter. Old lacunar infarction right basal ganglia. No sign of acute infarction, mass lesion, hemorrhage, hydrocephalus or extra-axial collection. Sinuses, middle ears and mastoids are clear. No calvarial abnormality. There is atherosclerotic calcification of the major vessels at the base of the brain. IMPRESSION: No acute finding.  Advanced atrophy and chronic ischemic changes. Electronically Signed   By: Nelson Chimes M.D.   On: 05/15/2016 10:36   US Renal  Result Date: 05/12/2016 CLINICAL DATA:  Elevated creatinine in, chronic kidney disease stage 4 EXAM: RENAL / URINARY TRACT ULTRASOUND COMPLETE COMPARISON:  09/21/2011 FINDINGS: Right Kidney: Length: 8.4 cm. No hydronephrosis or renal calculi. Small perinephric fluid. Left Kidney: Length: 8.5 cm. No hydronephrosis or renal calculi. Small perinephric fluid. Bilateral kidney shows diffuse increase echogenicity consistent with medical renal disease. Bladder: Appears normal for degree of bladder distention. Small perisplenic ascites. IMPRESSION: 1. Bilateral mild atrophic kidneys with increased cortical echogenicity suspicious for medical renal disease. Small perinephric fluid bilaterally. No hydronephrosis or renal calculi. Unremarkable urinary bladder. Electronically Signed   By: Lahoma Crocker M.D.   On: 05/12/2016 11:19   Dg Chest Port 1 View  Result  Date: 05/15/2016 CLINICAL DATA:  Lethargy.  Chronic renal disease. EXAM: PORTABLE CHEST 1 VIEW COMPARISON:  PA and lateral chest 05/12/2016. Single view of the chest 12/08/2014. FINDINGS: There is marked cardiomegaly with pulmonary vascular congestion. The patient has new small to moderate bilateral pleural effusions with basilar airspace disease, likely atelectasis. No pneumothorax. IMPRESSION: New small to moderate bilateral pleural effusions and basilar atelectasis since the most recent exam. Cardiomegaly and pulmonary vascular congestion. Electronically Signed   By: Inge Rise M.D.   On: 05/15/2016 10:31    Discharge Exam: Vitals:   05/22/16 0830 05/22/16 0900  BP: (!) 126/92 (!) 166/95  Pulse: 90 92  Resp: 18 16  Temp:     Vitals:   05/22/16 0730 05/22/16 0800 05/22/16 0830 05/22/16 0900  BP: (!) 116/99 (!) 138/91 (!) 126/92 (!) 166/95  Pulse: 80 79 90 92  Resp: 15 16 18 16   Temp:      TempSrc:      SpO2:      Weight:      Height:        General: Pt is alert, follows commands appropriately, not in acute distress Cardiovascular: Regular rate and rhythm, S1/S2 +, no rubs, no gallops Respiratory: Clear to auscultation bilaterally, no wheezing, no crackles, no rhonchi Abdominal: Soft, non tender, non distended, bowel sounds +, no guarding   Discharge Instructions  Discharge Instructions    Diet - low sodium heart healthy    Complete by:  As directed   Increase activity slowly    Complete by:  As directed       Medication List    STOP taking these medications   amLODipine 10 MG tablet Commonly known as:  NORVASC   furosemide 40 MG tablet Commonly known as:  LASIX   hydrALAZINE 50 MG tablet Commonly known as:  APRESOLINE   naproxen sodium 220 MG tablet Commonly known as:  ANAPROX     TAKE these medications   carvedilol 25 MG tablet Commonly known as:  COREG Take 1 tablet (25 mg total) by mouth 2 (two) times daily with a meal.   ipratropium-albuterol  0.5-2.5 (3) MG/3ML Soln Commonly known as:  DUONEB Take 3 mLs by nebulization every 4 (four) hours as needed.   isosorbide-hydrALAZINE 20-37.5 MG tablet Commonly known as:  BIDIL Take 1 tablet by mouth 3 (three) times daily.   multivitamin with minerals Tabs tablet Take 1 tablet by mouth daily.       Follow-up Information    Early, Todd, MD Follow up in 4 week(s).   Specialties:  Vascular Surgery, Cardiology Why:  Office will call you to arrange your appt (sent) Contact information: [place] [address] [phone]        MABE,DAVID, NP .   Specialty:  Family Medicine Contact information: [address] [place] [address] [phone]            The results of significant diagnostics from this hospitalization (including imaging, microbiology, ancillary and laboratory) are listed below for reference.     Microbiology: No results found for this or any previous visit (from the past 240 hour(s)).   Labs: Basic Metabolic Panel:  Recent Labs Lab 05/17/16 0729 05/18/16 0341 05/19/16 0407 05/20/16 0607 05/21/16 0337 05/22/16 0414  NA 137 137 136 135 136 136  K 4.1 3.7 3.8 4.3 3.9 3.9  CL 103 102 101 100* 101 100*  CO2 25 26 25 24 27 26   GLUCOSE 98 93 135* 94 89 85  BUN 43* 29* 42* 52* 26* 34*  CREATININE 8.94* 6.77* 8.87* 10.44* 6.68* 8.74*  CALCIUM 8.0* 8.2* 8.1* 8.6* 8.3* 8.7*  PHOS 4.0  --  4.0 4.8* 3.5 4.6   Liver Function Tests:  Recent Labs Lab 05/17/16 0729 05/19/16 0407 05/20/16 0607 05/21/16 0337 05/22/16 0414  ALBUMIN 2.8* 2.6* 2.8* 2.6* 2.6*   CBC:  Recent Labs Lab 05/18/16 0341 05/19/16 0407 05/20/16 0607 05/21/16 0337 05/22/16 0414  WBC 6.2 5.4 5.4 5.4 6.2  HGB 8.0* 7.3* 8.3* 7.9* 7.6*  HCT 25.1* 22.9* 26.2* 24.8* 23.2*  MCV 93.7 94.2 93.6 93.9 93.2  PLT 206 173 211 213 192   Cardiac Enzymes:  Recent Labs Lab 05/15/16 1047 05/15/16 1948 05/16/16 0130 05/16/16 0755  TROPONINI 1.25*  1.30* 1.11* 1.30*   BNP: BNP (last 3 results)  Recent Labs  05/12/16 0736  BNP 2,191.8*   CBG:  Recent Labs Lab 05/15/16 0943  GLUCAP 134*   SIGNED: Time coordinating discharge: 30 minutes  Faye Ramsay, MD  [facility] 05/22/2016, 9:34 AM Pager [phone]  If 7PM-7AM, please contact night-coverage [email or web address] Password [email or web address]

## 2016-05-22 NOTE — Progress Notes (Signed)
Family notified of discharge order today and also that Patient will need to go to the Hemodialysis facility tomorrow 7-28 to sign papers and will be able to have HD session on Saturday.

## 2016-05-22 NOTE — Progress Notes (Signed)
Patient discharged to home. Left floor in wheelchair with daughter. No IV access. Telemetry removed. All discharge instructions reviewed. Appts reviewed and prescriptions reviewed. Patients daughter also knows to take patient to HD tomorrow to sign paperwork. Normal HD session starts this Saturday on 7-29.

## 2016-05-22 NOTE — Progress Notes (Signed)
[facility] Progress Note    Subjective:  "I'm OK..I'm supposed to find out where to get dialysis." No C/Os. Being [place] home today. On HD now, tolerating well.   Objective Vitals:   05/22/16 0900 05/22/16 0930 05/22/16 1000 05/22/16 1030  BP: (!) 166/95 (!) 147/98 (!) 143/95 (!) 155/95  Pulse: 92 81 84 99  Resp: 16 16 18 18   Temp:      TempSrc:      SpO2:      Weight:      Height:       Physical Exam General: More alert and interactive. NAD Heart: S1,S2, regularly irregular. AFib on monitor.  Lungs: BBS dec in bases posteriorly CTA Abdomen: soft, nontender Extremities:No LE edema Dialysis Access: New LUA AVF + bruit. RIJ TDC Drsg intact on HD at present    Additional Objective Labs: Basic Metabolic Panel:  Recent Labs Lab 05/20/16 0607 05/21/16 0337 05/22/16 0414  NA 135 136 136  K 4.3 3.9 3.9  CL 100* 101 100*  CO2 24 27 26   GLUCOSE 94 89 85  BUN 52* 26* 34*  CREATININE 10.44* 6.68* 8.74*  CALCIUM 8.6* 8.3* 8.7*  PHOS 4.8* 3.5 4.6   Liver Function Tests:  Recent Labs Lab 05/20/16 0607 05/21/16 0337 05/22/16 0414  ALBUMIN 2.8* 2.6* 2.6*   No results for input(s): LIPASE, AMYLASE in the last 168 hours. CBC:  Recent Labs Lab 05/18/16 0341 05/19/16 0407 05/20/16 0607 05/21/16 0337 05/22/16 0414  WBC 6.2 5.4 5.4 5.4 6.2  HGB 8.0* 7.3* 8.3* 7.9* 7.6*  HCT 25.1* 22.9* 26.2* 24.8* 23.2*  MCV 93.7 94.2 93.6 93.9 93.2  PLT 206 173 211 213 192   Blood Culture    Component Value Date/Time   SDES URINE, CLEAN CATCH 05/12/2016 0824   SPECREQUEST NONE 05/12/2016 0824   CULT (A) 05/12/2016 0824    7,000 COLONIES/mL INSIGNIFICANT GROWTH Performed at [place] 05/13/2016 FINAL 05/12/2016 0824    Cardiac Enzymes:  Recent Labs Lab 05/15/16 1948 05/16/16 0130 05/16/16 0755  TROPONINI 1.30* 1.11* 1.30*   CBG: No results for input(s): GLUCAP in the last 168 hours. Iron Studies:  Recent Labs   05/22/16 0414  IRON 51  TIBC 262  FERRITIN 113   @lablastinr3 @ Studies/Results: No results found. Medications: . sodium chloride 10 mL/hr at 05/14/16 1157   . carvedilol  6.25 mg Oral BID WC  . darbepoetin (ARANESP) injection - DIALYSIS  60 mcg Intravenous Q Sat-HD  . doxercalciferol  2 mcg Intravenous Q T,Th,Sa-HD  . folic acid  1 mg Oral Daily  . heparin  5,000 Units Subcutaneous Q8H  . isosorbide-hydrALAZINE  1 tablet Oral TID  . multivitamin with minerals  1 tablet Oral Daily  . sodium chloride flush  3 mL Intravenous Q12H  . thiamine  100 mg Oral Daily   Assessment/Plan: 1. Acute delirium: Suspected to be related to alcohol withdrawal versus medication induced. More interactive today, oriented X 3 with prompting. Resolved. 2. Afib/Aflutter:per primary. Rate controlled. No anticoagulation.  3.ESRD: With progression of chronic kidney disease stage V from underlying hypertension/NSAIDs now to ESRD. Initiated on hemodialysis and now status post placement of left basilic vein transposition fistula on 05/14/16 as we use right IJ [facility] for dialysis. Clip process in progress for OP HD center.Will go to [facility] TTS 2nd shift.On HD now, tolerating well. 4.Anemia: HGB 7.6  today. Cont ESA. Next dose 05/24/16 Iron studies pending  5. CKD-MBD: Low phosphorus-likely  secondary to chronic malnutrition/poor intake with alcoholism. On low-dose vitamin D receptor analogue. Ca+ 8.1 C Ca 9.22 Use 2.25 Ca bath.  6. Nutrition: Albumin 2.6 Renal diet-add prostat/Renal bit. Chronic malnutrition secondary to alcoholism/impaired caloric intake. On thiamine.  7.Hypertension/Volume: On HD pre wt 80.7 kg today UFG 2500. Post wt 81.7kg on hemodialysis standing scales. Reweigh on 6E.  8.Acute on Chronic combined systolic/diastolic HF with pulmonary hypertension: On coreg, hydralazine. Per primary 9..ETOH abuse- says he quit, "2 wks ago". No evidence of acute withdrawal at present.   Disposition: Home today.  Start at [facility] Saturday.    Rita H. Brown NP-C 05/22/2016, 11:00 AM  [facility] [phone]  Pt seen, examined and agree w A/P as above.  Kelly Splinter MD [employer] pager [phone]    cell [phone] 05/22/2016, 12:17 PM

## 2016-05-24 DIAGNOSIS — D509 Iron deficiency anemia, unspecified: Secondary | ICD-10-CM | POA: Insufficient documentation

## 2016-05-24 DIAGNOSIS — N189 Chronic kidney disease, unspecified: Secondary | ICD-10-CM | POA: Insufficient documentation

## 2016-05-24 DIAGNOSIS — T829XXA Unspecified complication of cardiac and vascular prosthetic device, implant and graft, initial encounter: Secondary | ICD-10-CM | POA: Insufficient documentation

## 2016-05-24 DIAGNOSIS — L299 Pruritus, unspecified: Secondary | ICD-10-CM | POA: Insufficient documentation

## 2016-05-24 DIAGNOSIS — R509 Fever, unspecified: Secondary | ICD-10-CM | POA: Insufficient documentation

## 2016-05-24 DIAGNOSIS — N2581 Secondary hyperparathyroidism of renal origin: Secondary | ICD-10-CM | POA: Insufficient documentation

## 2016-05-24 DIAGNOSIS — D688 Other specified coagulation defects: Secondary | ICD-10-CM | POA: Insufficient documentation

## 2016-05-24 DIAGNOSIS — R197 Diarrhea, unspecified: Secondary | ICD-10-CM | POA: Insufficient documentation

## 2016-05-24 DIAGNOSIS — Z4931 Encounter for adequacy testing for hemodialysis: Secondary | ICD-10-CM | POA: Insufficient documentation

## 2016-06-02 DIAGNOSIS — R7309 Other abnormal glucose: Secondary | ICD-10-CM | POA: Insufficient documentation

## 2016-06-05 ENCOUNTER — Encounter: Payer: Self-pay | Admitting: Vascular Surgery

## 2016-06-10 ENCOUNTER — Encounter: Payer: Self-pay | Admitting: Vascular Surgery

## 2016-06-24 ENCOUNTER — Encounter: Payer: Self-pay | Admitting: Vascular Surgery

## 2016-07-01 ENCOUNTER — Encounter: Payer: Self-pay | Admitting: Vascular Surgery

## 2016-07-05 ENCOUNTER — Observation Stay (HOSPITAL_COMMUNITY)
Admission: EM | Admit: 2016-07-05 | Discharge: 2016-07-09 | Disposition: A | Discharge status: Home / Self Care | Payer: Medicaid Other | Attending: Internal Medicine | Admitting: Internal Medicine

## 2016-07-05 ENCOUNTER — Emergency Department (HOSPITAL_COMMUNITY): Payer: Medicaid Other

## 2016-07-05 DIAGNOSIS — E162 Hypoglycemia, unspecified: Secondary | ICD-10-CM | POA: Diagnosis present

## 2016-07-05 DIAGNOSIS — I4729 Other ventricular tachycardia: Secondary | ICD-10-CM

## 2016-07-05 DIAGNOSIS — I5042 Chronic combined systolic (congestive) and diastolic (congestive) heart failure: Secondary | ICD-10-CM | POA: Insufficient documentation

## 2016-07-05 DIAGNOSIS — I10 Essential (primary) hypertension: Secondary | ICD-10-CM | POA: Diagnosis present

## 2016-07-05 DIAGNOSIS — I472 Ventricular tachycardia: Secondary | ICD-10-CM | POA: Diagnosis not present

## 2016-07-05 DIAGNOSIS — I428 Other cardiomyopathies: Secondary | ICD-10-CM | POA: Diagnosis not present

## 2016-07-05 DIAGNOSIS — Z992 Dependence on renal dialysis: Secondary | ICD-10-CM | POA: Diagnosis not present

## 2016-07-05 DIAGNOSIS — R55 Syncope and collapse: Principal | ICD-10-CM | POA: Diagnosis present

## 2016-07-05 DIAGNOSIS — F1020 Alcohol dependence, uncomplicated: Secondary | ICD-10-CM | POA: Diagnosis present

## 2016-07-05 DIAGNOSIS — I509 Heart failure, unspecified: Secondary | ICD-10-CM

## 2016-07-05 DIAGNOSIS — I4892 Unspecified atrial flutter: Secondary | ICD-10-CM | POA: Diagnosis not present

## 2016-07-05 DIAGNOSIS — I5032 Chronic diastolic (congestive) heart failure: Secondary | ICD-10-CM | POA: Diagnosis present

## 2016-07-05 DIAGNOSIS — Z87891 Personal history of nicotine dependence: Secondary | ICD-10-CM | POA: Insufficient documentation

## 2016-07-05 DIAGNOSIS — I429 Cardiomyopathy, unspecified: Secondary | ICD-10-CM

## 2016-07-05 DIAGNOSIS — I132 Hypertensive heart and chronic kidney disease with heart failure and with stage 5 chronic kidney disease, or end stage renal disease: Secondary | ICD-10-CM | POA: Insufficient documentation

## 2016-07-05 DIAGNOSIS — Z79899 Other long term (current) drug therapy: Secondary | ICD-10-CM | POA: Diagnosis not present

## 2016-07-05 DIAGNOSIS — I4891 Unspecified atrial fibrillation: Secondary | ICD-10-CM | POA: Diagnosis not present

## 2016-07-05 DIAGNOSIS — F1010 Alcohol abuse, uncomplicated: Secondary | ICD-10-CM | POA: Insufficient documentation

## 2016-07-05 DIAGNOSIS — N186 End stage renal disease: Secondary | ICD-10-CM | POA: Diagnosis not present

## 2016-07-05 LAB — CBG MONITORING, ED
Glucose-Capillary: 87 mg/dL (ref 65–99)
Glucose-Capillary: 75 mg/dL (ref 65–99)

## 2016-07-05 LAB — CBC
HCT: 43.3 % (ref 39.0–52.0)
Hemoglobin: 14.8 g/dL (ref 13.0–17.0)
MCH: 31.7 pg (ref 26.0–34.0)
MCHC: 34.2 g/dL (ref 30.0–36.0)
MCV: 92.7 fL (ref 78.0–100.0)
PLATELETS: 246 10*3/uL (ref 150–400)
RBC: 4.67 MIL/uL (ref 4.22–5.81)
RDW: 15.5 % (ref 11.5–15.5)
WBC: 5.1 10*3/uL (ref 4.0–10.5)

## 2016-07-05 LAB — COMPREHENSIVE METABOLIC PANEL
ALBUMIN: 3.3 g/dL — AB (ref 3.5–5.0)
ALT: 12 U/L — ABNORMAL LOW (ref 17–63)
ANION GAP: 13 (ref 5–15)
AST: 30 U/L (ref 15–41)
Alkaline Phosphatase: 64 U/L (ref 38–126)
BILIRUBIN TOTAL: 1.4 mg/dL — AB (ref 0.3–1.2)
BUN: 23 mg/dL — ABNORMAL HIGH (ref 6–20)
CO2: 27 mmol/L (ref 22–32)
Calcium: 8.6 mg/dL — ABNORMAL LOW (ref 8.9–10.3)
Chloride: 96 mmol/L — ABNORMAL LOW (ref 101–111)
Creatinine, Ser: 7.08 mg/dL — ABNORMAL HIGH (ref 0.61–1.24)
GFR calc non Af Amer: 7 mL/min — ABNORMAL LOW (ref >60)
GFR, EST AFRICAN AMERICAN: 9 mL/min — AB (ref >60)
GLUCOSE: 74 mg/dL (ref 65–99)
POTASSIUM: 4.4 mmol/L (ref 3.5–5.1)
SODIUM: 136 mmol/L (ref 135–145)
TOTAL PROTEIN: 9.2 g/dL — AB (ref 6.5–8.1)

## 2016-07-05 LAB — I-STAT TROPONIN, ED: TROPONIN I, POC: 0.03 ng/mL (ref 0.00–0.08)

## 2016-07-05 MED ORDER — CARVEDILOL 12.5 MG PO TABS: 25.0000 mg | ORAL_TABLET | Freq: Two times a day (BID) | ORAL | Status: DC

## 2016-07-05 MED ORDER — LORAZEPAM 2 MG/ML IJ SOLN
0.5000 mg | Freq: Once | INTRAMUSCULAR | Status: AC
  Administered 2016-07-06: 0.5 mg via INTRAVENOUS
  Filled 2016-07-05: qty 1

## 2016-07-05 MED ORDER — MORPHINE SULFATE (PF) 2 MG/ML IV SOLN
2.0000 mg | Freq: Once | INTRAVENOUS | Status: AC
  Administered 2016-07-06: 2 mg via INTRAVENOUS
  Filled 2016-07-05: qty 1

## 2016-07-05 NOTE — ED Notes (Signed)
[place] sandwich, applesauce & apple juice provided to patient - per Dr. Jeanell Sparrow.

## 2016-07-05 NOTE — ED Notes (Signed)
Dr. Jeanell Sparrow at bedside. Aware frequent multiform PVCs and irregular HR.

## 2016-07-05 NOTE — ED Triage Notes (Signed)
Per EMS - pt went to dialysis today, completed tx. Began feeling dizzy and weak, sat down, CBG 42 from Fire. CBG 65 from EMS. Given 15g oral glucose, CBG increased to 71. Hx renal failure.

## 2016-07-05 NOTE — ED Provider Notes (Signed)
[facility] DEPT Provider Note   CSN: [id number] Arrival date & time: 07/05/16  1807     History   Chief Complaint Chief Complaint  Patient presents with  . Hypoglycemia  . Dizziness    HPI Kenneth [name] is a 60 y.o. male.  HPI  This is a 60 year old man who has been on hemodialysis for the past month presents today with complaints of lightheadedness and syncope after dialysis. He left dialysis center and was walking to the bus stop when he became lightheaded. He then had a falling episode but did not completely lose consciousness. EMS found his blood sugar to be low at 42 and he received po  glucose Secondary to this he was brought to the hospital. He has some chronic back pain but denies any new pain since injury today. He is currently not taking any of his medications. He does drink alcohol daily but has not had any alcohol for the past 2 days and denies any previous episodes of acute withdrawal.  Past Medical History:  Diagnosis Date  . Cardiomyopathy secondary    likely related to HTN heart disease; possibly ETOH related as well  . Chronic combined systolic and diastolic heart failure (HCC)    Echocardiogram 09/22/11: Moderate LVH, EF 87-86%, grade 3 diastolic dysfunction, mild MR, moderate to severe LAE, mild RVE, mild to moderate TR, small to moderate pericardial effusion  . CKD (chronic kidney disease) stage 4, GFR 15-29 ml/min (HCC)    renal failure; due to hypertensive nephrosclerosis  . History of alcohol abuse   . Hypertension     Patient Active Problem List   Diagnosis Date Noted  . CKD (chronic kidney disease)   . Alcohol abuse 05/13/2016  . Renal failure 05/12/2016  . Hypertensive urgency 12/08/2013  . Cardiomyopathy secondary 10/09/2011  . Essential hypertension 10/09/2011  . Alcohol withdrawal delirium ([place]) 09/25/2011  . CKD (chronic kidney disease), stage IV ([place]) 09/24/2011  . Systolic and diastolic CHF, acute on chronic ([place]) 09/24/2011    Past  Surgical History:  Procedure Laterality Date  . AV FISTULA PLACEMENT Left 05/14/2016   Procedure: LEFT ARM BASILIC VEIN TRANSPOSITION;  Surgeon: Rosetta Posner, MD;  Location: [facility];  Service: Vascular;  Laterality: Left;  . PERIPHERAL VASCULAR CATHETERIZATION N/A 05/13/2016   Procedure: Dialysis/Perma Catheter Insertion;  Surgeon: Serafina Mitchell, MD;  Location: [facility];  Service: Cardiovascular;  Laterality: N/A;       Home Medications    Prior to Admission medications   Medication Sig Start Date End Date Taking? Authorizing Provider  ipratropium-albuterol (DUONEB) 0.5-2.5 (3) MG/3ML SOLN Take 3 mLs by nebulization every 4 (four) hours as needed. 05/21/16  Yes Theodis Blaze, MD  isosorbide-hydrALAZINE (BIDIL) 20-37.5 MG tablet Take 1 tablet by mouth 3 (three) times daily. 05/21/16  Yes Theodis Blaze, MD  carvedilol (COREG) 25 MG tablet Take 1 tablet (25 mg total) by mouth 2 (two) times daily with a meal. Patient not taking: Reported on 07/05/2016 02/14/14 07/05/16  Roselee Culver, MD  Multiple Vitamin (MULTIVITAMIN WITH MINERALS) TABS tablet Take 1 tablet by mouth daily. Patient not taking: Reported on 07/05/2016 05/21/16   Theodis Blaze, MD    Family History Family History  Problem Relation Age of Onset  . Emphysema Mother   . Cirrhosis Father     Social History Social History  Substance Use Topics  . Smoking status: Former Research scientist (life sciences)  . Smokeless tobacco: Current User  . Alcohol use 1.8  oz/week    3 Cans of beer per week     Comment: occ     Allergies   Review of patient's allergies indicates no known allergies.   Review of Systems Review of Systems   Physical Exam Updated Vital Signs BP 112/75   Pulse 60   Resp 20   SpO2 99%   Physical Exam  Constitutional: He is oriented to person, place, and time. He appears well-developed and well-nourished.  HENT:  Head: Normocephalic and atraumatic.  Right Ear: External ear normal.  Left Ear: External ear normal.    Nose: Nose normal.  Mouth/Throat: Oropharynx is clear and moist.  Eyes: Conjunctivae and EOM are normal. Pupils are equal, round, and reactive to light.  Neck: Normal range of motion. Neck supple.  Cardiovascular: Normal rate, regular rhythm, normal heart sounds and intact distal pulses.   Pulmonary/Chest: Effort normal and breath sounds normal. No respiratory distress. He has no wheezes. He exhibits no tenderness.  Abdominal: Soft. Bowel sounds are normal. He exhibits no distension and no mass. There is no tenderness. There is no guarding.  Musculoskeletal: Normal range of motion.  Neurological: He is alert and oriented to person, place, and time. He has normal reflexes. He exhibits normal muscle tone. Coordination normal.  Skin: Skin is warm and dry.  Psychiatric: He has a normal mood and affect. His behavior is normal. Judgment and thought content normal.  Nursing note and vitals reviewed.    ED Treatments / Results  Labs (all labs ordered are listed, but only abnormal results are displayed) Labs Reviewed  COMPREHENSIVE METABOLIC PANEL - Abnormal; Notable for the following:       Result Value   Chloride 96 (*)    BUN 23 (*)    Creatinine, Ser 7.08 (*)    Calcium 8.6 (*)    Total Protein 9.2 (*)    Albumin 3.3 (*)    ALT 12 (*)    Total Bilirubin 1.4 (*)    GFR calc non Af Amer 7 (*)    GFR calc Af Amer 9 (*)    All other components within normal limits  CBC  CBG MONITORING, ED  I-STAT TROPOININ, ED    EKG  EKG Interpretation  Date/Time:  Saturday July 05 2016 18:16:27 EDT Ventricular Rate:  109 PR Interval:    QRS Duration: 101 QT Interval:  359 QTC Calculation: 484 R Axis:   17 Text Interpretation:  Atrial fibrillation LVH with secondary repolarization abnormality Borderline prolonged QT interval No significant change since last tracing Confirmed by Tulio Facundo MD, Andee Poles [phone]) on 07/05/2016 6:33:07 PM       Radiology Dg Chest Port 1 View  Result Date:  07/05/2016 CLINICAL DATA:  Dizziness.  Dialysis today. EXAM: PORTABLE CHEST 1 VIEW COMPARISON:  May 15, 2016 FINDINGS: A double lumen dialysis catheter is identified on the right in good position. The heart, hila, mediastinum, lungs, and pleura are normal. IMPRESSION: No active disease. Electronically Signed   By: Dorise Bullion III M.D   On: 07/05/2016 19:06    Procedures Procedures (including critical care time)  Medications Ordered in ED Medications - No data to display   Initial Impression / Assessment and Plan / ED Course  I have reviewed the triage vital signs and the nursing notes.  Pertinent labs & imaging results that were available during my care of the patient were reviewed by me and considered in my medical decision making (see chart for details).  Clinical Course  Patient with 7 beat run of vtach on monitor. [place]     1- syncope 2- hypoglycemia- bs 75 now- patient eating 3- a fib with pvcs- stable from prior patient not takine meds and rate somewhat increased with normal bp 4- v tach- patient with run of v tach on monitor- will require on going monitoring and cardiology consult   Final Clinical Impressions(s) / ED Diagnoses   Final diagnoses:  Syncope   Discussed with Dr. Alcario Drought and will place in telemetry bed New Prescriptions New Prescriptions   No medications on file     Pattricia Boss, MD 07/06/16 0002

## 2016-07-06 ENCOUNTER — Encounter (HOSPITAL_COMMUNITY): Payer: Self-pay | Admitting: Emergency Medicine

## 2016-07-06 DIAGNOSIS — E162 Hypoglycemia, unspecified: Secondary | ICD-10-CM | POA: Diagnosis present

## 2016-07-06 DIAGNOSIS — I472 Ventricular tachycardia: Secondary | ICD-10-CM

## 2016-07-06 DIAGNOSIS — I4729 Other ventricular tachycardia: Secondary | ICD-10-CM

## 2016-07-06 LAB — GLUCOSE, CAPILLARY
GLUCOSE-CAPILLARY: 85 mg/dL (ref 65–99)
GLUCOSE-CAPILLARY: 120 mg/dL — AB (ref 65–99)
GLUCOSE-CAPILLARY: 113 mg/dL — AB (ref 65–99)
Glucose-Capillary: 79 mg/dL (ref 65–99)
Glucose-Capillary: 84 mg/dL (ref 65–99)
Glucose-Capillary: 95 mg/dL (ref 65–99)
Glucose-Capillary: 99 mg/dL (ref 65–99)

## 2016-07-06 LAB — CBG MONITORING, ED: GLUCOSE-CAPILLARY: 85 mg/dL (ref 65–99)

## 2016-07-06 LAB — MRSA PCR SCREENING: MRSA by PCR: NEGATIVE

## 2016-07-06 MED ORDER — HEPARIN SODIUM (PORCINE) 5000 UNIT/ML IJ SOLN
5000.0000 [IU] | Freq: Three times a day (TID) | INTRAMUSCULAR | Status: DC
  Administered 2016-07-06 – 2016-07-09 (×10): 5000 [IU] via SUBCUTANEOUS
  Filled 2016-07-06 (×10): qty 1

## 2016-07-06 MED ORDER — CARVEDILOL 12.5 MG PO TABS
12.5000 mg | ORAL_TABLET | Freq: Two times a day (BID) | ORAL | Status: DC
  Administered 2016-07-06 – 2016-07-07 (×2): 12.5 mg via ORAL
  Filled 2016-07-06: qty 1

## 2016-07-06 MED ORDER — IPRATROPIUM-ALBUTEROL 0.5-2.5 (3) MG/3ML IN SOLN: 3.0000 mL | RESPIRATORY_TRACT | Status: DC | PRN

## 2016-07-06 MED ORDER — LORAZEPAM 1 MG PO TABS: 1.0000 mg | ORAL_TABLET | Freq: Four times a day (QID) | ORAL | Status: AC | PRN

## 2016-07-06 MED ORDER — VITAMIN B-1 100 MG PO TABS
100.0000 mg | ORAL_TABLET | Freq: Every day | ORAL | Status: DC
  Administered 2016-07-06 – 2016-07-09 (×4): 100 mg via ORAL
  Filled 2016-07-06 (×5): qty 1

## 2016-07-06 MED ORDER — ADULT MULTIVITAMIN W/MINERALS CH
1.0000 | ORAL_TABLET | Freq: Every day | ORAL | Status: DC
  Administered 2016-07-06 – 2016-07-09 (×4): 1 via ORAL
  Filled 2016-07-06 (×5): qty 1

## 2016-07-06 MED ORDER — ISOSORB DINITRATE-HYDRALAZINE 20-37.5 MG PO TABS
1.0000 | ORAL_TABLET | Freq: Three times a day (TID) | ORAL | Status: DC
  Administered 2016-07-06 – 2016-07-08 (×9): 1 via ORAL
  Filled 2016-07-06 (×10): qty 1

## 2016-07-06 MED ORDER — SODIUM CHLORIDE 0.9% FLUSH
3.0000 mL | Freq: Two times a day (BID) | INTRAVENOUS | Status: DC
  Administered 2016-07-06 – 2016-07-08 (×6): 3 mL via INTRAVENOUS

## 2016-07-06 MED ORDER — FOLIC ACID 1 MG PO TABS
1.0000 mg | ORAL_TABLET | Freq: Every day | ORAL | Status: DC
  Administered 2016-07-06 – 2016-07-09 (×4): 1 mg via ORAL
  Filled 2016-07-06 (×5): qty 1

## 2016-07-06 MED ORDER — LORAZEPAM 2 MG/ML IJ SOLN: 1.0000 mg | Freq: Four times a day (QID) | INTRAMUSCULAR | Status: AC | PRN

## 2016-07-06 MED ORDER — THIAMINE HCL 100 MG/ML IJ SOLN
100.0000 mg | Freq: Every day | INTRAMUSCULAR | Status: DC
  Filled 2016-07-06 (×2): qty 2

## 2016-07-06 NOTE — H&P (Signed)
History and Physical    Kenneth [name] DXI:[id number] DOB: 22-Sep-1956 DOA: 07/05/2016   PCP: [name], NP (Inactive) Chief Complaint:  Chief Complaint  Patient presents with  . Hypoglycemia  . Dizziness    HPI: Kenneth [name] is a 60 y.o. male with medical history significant of ESRD dialysis TTS, CHF with EF 25-30%, EtOH abuse last drink 2 days ago. He left dialysis center today after dialysis and was walking to bus stop when he became lightheaded and near-syncope episode.  EMS was called and his BGL was 42, improved with PO glucose.  Patient does not have a history of DM and is on no BGL lowering medications.  He thinks his BGL may have been low due to not eating at all today he says.  Has chronic back pain but no injury with near-syncopal episode today.  ED Course: During his stay in the ED his BGL has remained stable but he did have a run of V.Tach on the monitor for about 9-10 beats.  Review of Systems: As per HPI otherwise 10 point review of systems negative.    Past Medical History:  Diagnosis Date  . Cardiomyopathy secondary    likely related to HTN heart disease; possibly ETOH related as well  . Chronic combined systolic and diastolic heart failure (HCC)    Echocardiogram 09/22/11: Moderate LVH, EF 76-73%, grade 3 diastolic dysfunction, mild MR, moderate to severe LAE, mild RVE, mild to moderate TR, small to moderate pericardial effusion  . CKD (chronic kidney disease) stage 4, GFR 15-29 ml/min (HCC)    renal failure; due to hypertensive nephrosclerosis  . History of alcohol abuse   . Hypertension     Past Surgical History:  Procedure Laterality Date  . AV FISTULA PLACEMENT Left 05/14/2016   Procedure: LEFT ARM BASILIC VEIN TRANSPOSITION;  Surgeon: Rosetta Posner, MD;  Location: [facility];  Service: Vascular;  Laterality: Left;  . PERIPHERAL VASCULAR CATHETERIZATION N/A 05/13/2016   Procedure: Dialysis/Perma Catheter Insertion;  Surgeon: Serafina Mitchell, MD;  Location: [facility];  Service: Cardiovascular;  Laterality: N/A;     reports that he has quit smoking. He uses smokeless tobacco. He reports that he drinks about 1.8 oz of alcohol per week . He reports that he does not use drugs.  No Known Allergies  Family History  Problem Relation Age of Onset  . Emphysema Mother   . Cirrhosis Father       Prior to Admission medications   Medication Sig Start Date End Date Taking? Authorizing Provider  ipratropium-albuterol (DUONEB) 0.5-2.5 (3) MG/3ML SOLN Take 3 mLs by nebulization every 4 (four) hours as needed. 05/21/16  Yes Theodis Blaze, MD  isosorbide-hydrALAZINE (BIDIL) 20-37.5 MG tablet Take 1 tablet by mouth 3 (three) times daily. 05/21/16  Yes Theodis Blaze, MD  carvedilol (COREG) 25 MG tablet Take 1 tablet (25 mg total) by mouth 2 (two) times daily with a meal. Patient not taking: Reported on 07/05/2016 02/14/14 07/05/16  Roselee Culver, MD    Physical Exam: Vitals:   07/05/16 2300 07/05/16 2315 07/06/16 0000 07/06/16 0045  BP: 135/77 136/89 120/74 115/100  Pulse: (!) 59 (!) 53 99 100  Resp:      SpO2: 97% 92% 94% 96%      Constitutional: NAD, calm, comfortable Eyes: PERRL, lids and conjunctivae normal ENMT: Mucous membranes are moist. Posterior pharynx clear of any exudate or lesions.Normal dentition.  Neck: normal, supple, no masses, no thyromegaly Respiratory: clear to auscultation  bilaterally, no wheezing, no crackles. Normal respiratory effort. No accessory muscle use.  Cardiovascular: Regular rate and rhythm, no murmurs / rubs / gallops. No extremity edema. 2+ pedal pulses. No carotid bruits.  Abdomen: no tenderness, no masses palpated. No hepatosplenomegaly. Bowel sounds positive.  Musculoskeletal: no clubbing / cyanosis. No joint deformity upper and lower extremities. Good ROM, no contractures. Normal muscle tone.  Skin: no rashes, lesions, ulcers. No induration Neurologic: CN 2-12 grossly intact. Sensation intact, DTR normal. Strength  5/5 in all 4.  Psychiatric: Normal judgment and insight. Alert and oriented x 3. Normal mood.    Labs on Admission: I have personally reviewed following labs and imaging studies  CBC:  Recent Labs Lab 07/05/16 1810  WBC 5.1  HGB 14.8  HCT 43.3  MCV 92.7  PLT 967   Basic Metabolic Panel:  Recent Labs Lab 07/05/16 1810  NA 136  K 4.4  CL 96*  CO2 27  GLUCOSE 74  BUN 23*  CREATININE 7.08*  CALCIUM 8.6*   GFR: CrCl cannot be calculated (Unknown ideal weight.). Liver Function Tests:  Recent Labs Lab 07/05/16 1810  AST 30  ALT 12*  ALKPHOS 64  BILITOT 1.4*  PROT 9.2*  ALBUMIN 3.3*   No results for input(s): LIPASE, AMYLASE in the last 168 hours. No results for input(s): AMMONIA in the last 168 hours. Coagulation Profile: No results for input(s): INR, PROTIME in the last 168 hours. Cardiac Enzymes: No results for input(s): CKTOTAL, CKMB, CKMBINDEX, TROPONINI in the last 168 hours. BNP (last 3 results) No results for input(s): PROBNP in the last 8760 hours. HbA1C: No results for input(s): HGBA1C in the last 72 hours. CBG:  Recent Labs Lab 07/05/16 1807 07/05/16 2212 07/06/16 0141  GLUCAP 87 75 85   Lipid Profile: No results for input(s): CHOL, HDL, LDLCALC, TRIG, CHOLHDL, LDLDIRECT in the last 72 hours. Thyroid Function Tests: No results for input(s): TSH, T4TOTAL, FREET4, T3FREE, THYROIDAB in the last 72 hours. Anemia Panel: No results for input(s): VITAMINB12, FOLATE, FERRITIN, TIBC, IRON, RETICCTPCT in the last 72 hours. Urine analysis:    Component Value Date/Time   COLORURINE YELLOW 05/12/2016 0834   APPEARANCEUR CLOUDY (A) 05/12/2016 0834   LABSPEC 1.014 05/12/2016 0834   PHURINE 5.0 05/12/2016 0834   GLUCOSEU NEGATIVE 05/12/2016 0834   HGBUR MODERATE (A) 05/12/2016 0834   BILIRUBINUR NEGATIVE 05/12/2016 0834   KETONESUR NEGATIVE 05/12/2016 0834   PROTEINUR 100 (A) 05/12/2016 0834   UROBILINOGEN 0.2 12/09/2013 0013   NITRITE NEGATIVE  05/12/2016 0834   LEUKOCYTESUR NEGATIVE 05/12/2016 0834   Sepsis Labs: @LABRCNTIP (procalcitonin:4,lacticidven:4) )No results found for this or any previous visit (from the past 240 hour(s)).   Radiological Exams on Admission: Dg Chest Port 1 View  Result Date: 07/05/2016 CLINICAL DATA:  Dizziness.  Dialysis today. EXAM: PORTABLE CHEST 1 VIEW COMPARISON:  May 15, 2016 FINDINGS: A double lumen dialysis catheter is identified on the right in good position. The heart, hila, mediastinum, lungs, and pleura are normal. IMPRESSION: No active disease. Electronically Signed   By: Dorise Bullion III M.D   On: 07/05/2016 19:06    EKG: Independently reviewed.  Assessment/Plan Principal Problem:   NSVT (nonsustained ventricular tachycardia) (HCC) Active Problems:   Systolic and diastolic CHF, chronic (HCC)   Essential hypertension   ESRD (end stage renal disease) (HCC)   Alcohol abuse   Hypoglycemia    1. NSVT - in setting of CHF with reduced EF 1. Tele monitor 2. Likely warrants cards eval  or office follow up at the very least regarding possible AICD placement. 3. Will hold off on repeat 2d echo at this time since last echo was 2 months ago 2. CHF - see above 3. ESRD - call nephrology if patient still here on Tuesday for dialysis 4. EtOH abuse - last drink 2 days ago 1. CIWA 5. Hypoglycemia - Q2H BGLs overnight   DVT prophylaxis: Heparin [place] Code Status: Full Family Communication: Wife at bedside Consults called: None Admission status: place in [place], [facility] Pager [phone] from 7PM-7AM  If 7AM-7PM, please contact the day physician for the patient [email or web address] Password [email or web address]  07/06/2016, 1:55 AM

## 2016-07-06 NOTE — ED Notes (Signed)
Daughter, [name] [phone]

## 2016-07-06 NOTE — Consult Note (Signed)
CARDIOLOGY CONSULT NOTE  Patient ID: Kenneth [name] MRN: [id number] DOB/AGE: Sep 13, 1956 60 y.o.  Admit date: 07/05/2016 Primary Physician  MABE,DAVID, NP (Inactive) Primary Cardiologist Dr. [name] Chief Complaint  NSVT Requesting  Dr. Grandville Silos.   HPI:  The patient had a near syncope episode with a blood sugar in the 40s.  We are asked to see him secondary to a 9 beat run of NSVT.   He reports that he was walking to the bus stop after dialysis when he got weak.  He says he did not feel palpitations.  He had no chest pain.  He did not have an actual LOC.  He reports that he takes his meds and does OK with dialysis.  The patient denies any new symptoms such as chest discomfort, neck or arm discomfort. There has been no new shortness of breath, PND or orthopnea. There have been no reported palpitations, presyncope or syncope.  The patient has a history of HTN/CKD and HF and ETOH abuse.  We saw him in July when he was admitted with acute renal failure. He had a history of HF with an EF of 30 - 35% with moderate TR and mild MR.  This was noted at the time of this presentation. At that time he was also in new onset atrial flutter.  His reduced EF, which was apparently new, was thought to be secondary to ETOH and HTN.    Past Medical History:  Diagnosis Date  . Cardiomyopathy secondary    likely related to HTN heart disease; possibly ETOH related as well  . Chronic combined systolic and diastolic heart failure (HCC)    Echocardiogram 09/22/11: Moderate LVH, EF 38-25%, grade 3 diastolic dysfunction, mild MR, moderate to severe LAE, mild RVE, mild to moderate TR, small to moderate pericardial effusion  . CKD (chronic kidney disease) stage 4, GFR 15-29 ml/min (HCC)    renal failure; due to hypertensive nephrosclerosis  . History of alcohol abuse   . Hypertension     Past Surgical History:  Procedure Laterality Date  . AV FISTULA PLACEMENT Left 05/14/2016   Procedure: LEFT ARM BASILIC VEIN  TRANSPOSITION;  Surgeon: Rosetta Posner, MD;  Location: [facility];  Service: Vascular;  Laterality: Left;  . PERIPHERAL VASCULAR CATHETERIZATION N/A 05/13/2016   Procedure: Dialysis/Perma Catheter Insertion;  Surgeon: Serafina Mitchell, MD;  Location: [facility];  Service: Cardiovascular;  Laterality: N/A;    No Known Allergies Prescriptions Prior to Admission  Medication Sig Dispense Refill Last Dose  . ipratropium-albuterol (DUONEB) 0.5-2.5 (3) MG/3ML SOLN Take 3 mLs by nebulization every 4 (four) hours as needed. 360 mL 1 Past Month at Unknown time  . isosorbide-hydrALAZINE (BIDIL) 20-37.5 MG tablet Take 1 tablet by mouth 3 (three) times daily. 90 tablet 0 Past Month at Unknown time  . carvedilol (COREG) 25 MG tablet Take 1 tablet (25 mg total) by mouth 2 (two) times daily with a meal. (Patient not taking: Reported on 07/05/2016) 60 tablet 1 Not Taking at Unknown time   Family History  Problem Relation Age of Onset  . Emphysema Mother   . Cirrhosis Father     Social History   Social History  . Marital status: Married    Spouse name: N/A  . Number of children: N/A  . Years of education: N/A   Occupational History  . Not on file.   Social History Main Topics  . Smoking status: Former Research scientist (life sciences)  . Smokeless tobacco: Current User  . Alcohol  use 1.8 oz/week    3 Cans of beer per week     Comment: occ  . Drug use: No  . Sexual activity: Not on file   Other Topics Concern  . Not on file   Social History Narrative  . No narrative on file     ROS:    As stated in the HPI and negative for all other systems.  Physical Exam: Blood pressure 102/62, pulse 91, temperature 98.4 F (36.9 C), temperature source Oral, resp. rate 18, height 5\' 10"  (1.778 m), weight 168 lb 14 oz (76.6 kg), SpO2 99 %.  GENERAL:  Well appearing HEENT:  Pupils equal round and reactive, fundi not visualized, oral mucosa unremarkable NECK:  No jugular venous distention, waveform within normal limits, carotid  upstroke brisk and symmetric, no bruits, no thyromegaly LYMPHATICS:  No cervical, inguinal adenopathy LUNGS:  Clear to auscultation bilaterally BACK:  No CVA tenderness CHEST:  Unremarkable HEART:  PMI not displaced or sustained,S1 and S2 within normal limits, no S3, no S4, no clicks, no rubs, no murmurs ABD:  Flat, positive bowel sounds normal in frequency in pitch, no bruits, no rebound, no guarding, no midline pulsatile mass, no hepatomegaly, no splenomegaly EXT:  2 plus pulses throughout, no edema, no cyanosis no clubbing SKIN:  No rashes no nodules, dry skin NEURO:  Cranial nerves II through XII grossly intact, motor grossly intact throughout PSYCH:  Cognitively intact, oriented to person place and time  Labs: Lab Results  Component Value Date   BUN 23 (H) 07/05/2016   Lab Results  Component Value Date   CREATININE 7.08 (H) 07/05/2016   Lab Results  Component Value Date   NA 136 07/05/2016   K 4.4 07/05/2016   CL 96 (L) 07/05/2016   CO2 27 07/05/2016   Lab Results  Component Value Date   TROPONINI 1.30 (HH) 05/16/2016   Lab Results  Component Value Date   WBC 5.1 07/05/2016   HGB 14.8 07/05/2016   HCT 43.3 07/05/2016   MCV 92.7 07/05/2016   PLT 246 07/05/2016   No results found for: CHOL, HDL, LDLCALC, LDLDIRECT, TRIG, CHOLHDL Lab Results  Component Value Date   ALT 12 (L) 07/05/2016   AST 30 07/05/2016   ALKPHOS 64 07/05/2016   BILITOT 1.4 (H) 07/05/2016      Radiology:   CXR: A double lumen dialysis catheter is identified on the right in good position. The heart, hila, mediastinum, lungs, and pleura are Normal.  AST:MHDQQI fib, rate 109, PVCs, LVH, no acute ST T wave changes.  ASSESSMENT AND PLAN:   CARDIOMYOPATHY:    This is presumed to be non ischemic.  However, I think that he could have an ischemia evaluation and I will schedule a Lexiscan Myoview for this admission.     NSVT:    Doubt that this was contributory to his presyncope.  Plan ischemia  work up.  Medical management pending this outcome with med titration as BP allows.  Currently he only seems to be tolerating the Coreg.  CKD:   Per rena.   HTN:   This is being managed in the context of treating his CHF.  His BP is actually running low.   ATRIAL FLUTTER:  This patients CHA2DS2-VASc Score and unadjusted Ischemic Stroke Rate (% per year) is equal to 4 % stroke rate/year from a score of 4 .  However, he was not thought to be an anticoagulation candidate.  Continue with rate control.  SignedMinus Breeding 07/06/2016, 4:04 PM

## 2016-07-06 NOTE — Progress Notes (Signed)
I have seen and assessed patient and agree with Dr Juleen China assessment and plan. Patient is a pleasant 60 year old gentleman history of end-stage renal disease on hemodialysis Tuesdays Thursdays Saturdays, history of CHF EF 25-30% who was admitted secondary to lightheadedness and near syncopal episode noted to have a blood glucose of 42 that improved with oral glucose. Patient noted in the ED to have a run of V. tach on the monitor, 9-10 beats. Patient admitted for further evaluation. Cardiology consulted.

## 2016-07-07 ENCOUNTER — Observation Stay (HOSPITAL_COMMUNITY): Payer: Medicaid Other

## 2016-07-07 ENCOUNTER — Observation Stay (HOSPITAL_BASED_OUTPATIENT_CLINIC_OR_DEPARTMENT_OTHER): Payer: Medicaid Other

## 2016-07-07 DIAGNOSIS — I509 Heart failure, unspecified: Secondary | ICD-10-CM

## 2016-07-07 DIAGNOSIS — I429 Cardiomyopathy, unspecified: Secondary | ICD-10-CM

## 2016-07-07 DIAGNOSIS — N186 End stage renal disease: Secondary | ICD-10-CM

## 2016-07-07 DIAGNOSIS — I5042 Chronic combined systolic (congestive) and diastolic (congestive) heart failure: Secondary | ICD-10-CM

## 2016-07-07 DIAGNOSIS — R55 Syncope and collapse: Secondary | ICD-10-CM | POA: Diagnosis present

## 2016-07-07 DIAGNOSIS — F1010 Alcohol abuse, uncomplicated: Secondary | ICD-10-CM

## 2016-07-07 DIAGNOSIS — I10 Essential (primary) hypertension: Secondary | ICD-10-CM

## 2016-07-07 LAB — NM MYOCAR MULTI W/SPECT W/WALL MOTION / EF
CHL CUP NUCLEAR SDS: 0
CHL CUP NUCLEAR SRS: 4
CHL CUP NUCLEAR SSS: 4
CHL CUP STRESS STAGE 1 GRADE: 0 %
CHL CUP STRESS STAGE 1 HR: 71 {beats}/min
CHL CUP STRESS STAGE 1 SPEED: 0 mph
CHL CUP STRESS STAGE 2 GRADE: 0 %
CHL CUP STRESS STAGE 2 HR: 71 {beats}/min
CHL CUP STRESS STAGE 2 SPEED: 0 mph
CHL CUP STRESS STAGE 3 DBP: 78 mmHg
CHL CUP STRESS STAGE 3 SBP: 144 mmHg
CHL CUP STRESS STAGE 3 SPEED: 0 mph
CHL CUP STRESS STAGE 4 DBP: 79 mmHg
CHL CUP STRESS STAGE 4 GRADE: 0 %
CSEPED: 5 min
CSEPEW: 1 METS
CSEPHR: 63 %
CSEPPBP: 131 mmHg
CSEPPHR: 84 {beats}/min
CSEPPMHR: 53 %
Exercise duration (sec): 0 s
LV dias vol: 214 mL (ref 62–150)
LVSYSVOL: 135 mL
MPHR: 158 {beats}/min
NUC STRESS TID: 1.07
RATE: 0.29
Rest HR: 73 {beats}/min
Stage 1 DBP: 90 mmHg
Stage 1 SBP: 144 mmHg
Stage 3 Grade: 0 %
Stage 3 HR: 90 {beats}/min
Stage 4 HR: 84 {beats}/min
Stage 4 SBP: 131 mmHg
Stage 4 Speed: 0 mph

## 2016-07-07 LAB — BASIC METABOLIC PANEL
Anion gap: 17 — ABNORMAL HIGH (ref 5–15)
BUN: 47 mg/dL — AB (ref 6–20)
CHLORIDE: 95 mmol/L — AB (ref 101–111)
CO2: 24 mmol/L (ref 22–32)
Calcium: 8.7 mg/dL — ABNORMAL LOW (ref 8.9–10.3)
Creatinine, Ser: 11.38 mg/dL — ABNORMAL HIGH (ref 0.61–1.24)
GFR calc Af Amer: 5 mL/min — ABNORMAL LOW (ref >60)
GFR calc non Af Amer: 4 mL/min — ABNORMAL LOW (ref >60)
GLUCOSE: 79 mg/dL (ref 65–99)
POTASSIUM: 4.2 mmol/L (ref 3.5–5.1)
Sodium: 136 mmol/L (ref 135–145)

## 2016-07-07 LAB — GLUCOSE, CAPILLARY
GLUCOSE-CAPILLARY: 107 mg/dL — AB (ref 65–99)
GLUCOSE-CAPILLARY: 139 mg/dL — AB (ref 65–99)
Glucose-Capillary: 86 mg/dL (ref 65–99)
Glucose-Capillary: 84 mg/dL (ref 65–99)

## 2016-07-07 LAB — MAGNESIUM: MAGNESIUM: 2.1 mg/dL (ref 1.7–2.4)

## 2016-07-07 MED ORDER — CALCITRIOL 0.5 MCG PO CAPS
0.7500 ug | ORAL_CAPSULE | ORAL | Status: DC
  Administered 2016-07-08: 0.75 ug via ORAL
  Filled 2016-07-07: qty 1

## 2016-07-07 MED ORDER — NEPRO/CARBSTEADY PO LIQD
237.0000 mL | Freq: Two times a day (BID) | ORAL | Status: DC
  Administered 2016-07-07 – 2016-07-09 (×3): 237 mL via ORAL
  Filled 2016-07-07 (×7): qty 237

## 2016-07-07 MED ORDER — TECHNETIUM TC 99M TETROFOSMIN IV KIT
10.0000 | PACK | Freq: Once | INTRAVENOUS | Status: AC | PRN
  Administered 2016-07-07: 10 via INTRAVENOUS

## 2016-07-07 MED ORDER — TECHNETIUM TC 99M TETROFOSMIN IV KIT
30.0000 | PACK | Freq: Once | INTRAVENOUS | Status: AC | PRN
  Administered 2016-07-07: 30 via INTRAVENOUS

## 2016-07-07 MED ORDER — CARVEDILOL 25 MG PO TABS
25.0000 mg | ORAL_TABLET | Freq: Two times a day (BID) | ORAL | Status: DC
  Administered 2016-07-07 – 2016-07-08 (×3): 25 mg via ORAL
  Filled 2016-07-07 (×4): qty 1

## 2016-07-07 MED ORDER — REGADENOSON 0.4 MG/5ML IV SOLN
INTRAVENOUS | Status: AC
  Administered 2016-07-07: 0.4 mg via INTRAVENOUS
  Filled 2016-07-07: qty 5

## 2016-07-07 MED ORDER — REGADENOSON 0.4 MG/5ML IV SOLN
0.4000 mg | Freq: Once | INTRAVENOUS | Status: AC
  Administered 2016-07-07: 0.4 mg via INTRAVENOUS
  Filled 2016-07-07: qty 5

## 2016-07-07 NOTE — Progress Notes (Signed)
Called by RN. Pt had 14 bts NSVT- asymptomatic. He just received Coreg this after will observe. K+ 4.2  Johathon Overturf PA-C 07/07/2016 5:05 PM

## 2016-07-07 NOTE — Consult Note (Signed)
[facility] Renal Consultation Note    Indication for Consultation:  Management of ESRD/hemodialysis; anemia, hypertension/volume and secondary hyperparathyroidism  HPI: Kenneth [name] is a 60 y.o. male with hx poorly controlled HTN, CM (20-25% 04/2016) secondary to hypertensive heart disease, CHF, who initiated dialysis in July 2017 at [facility].  During that admission, he had afib/aflutter, seen by cardiology and continued on coreg and isosorbide/hydralazine (BIDIL). He has attended most dialysis treatments since then and ran his full time 9/9 with a net UF of 3.3 and post weight 78 kg (EDW 77.5).  His pre dialysis BP was 177/96 sitting and 144/108 standing with P 82.  Post HD BP was 116/79 sitting and 108/81 standing P 71.  He was waiting at the bus stop Saturday after dialysis when he felt bad. He didn't actually pass out or have CP. EMS was called  Blood sugar was low at 42 but he hadn't eaten.  In the ED he had a 9 - 10 beat run of vtach. He had no SOB or problems with dialysis Saturday but a times is weak and dizzy after dialysis.  CXR was clear.  Past Medical History:  Diagnosis Date  . Cardiomyopathy secondary    likely related to HTN heart disease; possibly ETOH related as well  . Chronic combined systolic and diastolic heart failure (HCC)    Echocardiogram 09/22/11: Moderate LVH, EF 60-45%, grade 3 diastolic dysfunction, mild MR, moderate to severe LAE, mild RVE, mild to moderate TR, small to moderate pericardial effusion  . CKD (chronic kidney disease) stage 4, GFR 15-29 ml/min (HCC)    renal failure; due to hypertensive nephrosclerosis  . History of alcohol abuse   . Hypertension    Past Surgical History:  Procedure Laterality Date  . AV FISTULA PLACEMENT Left 05/14/2016   Procedure: LEFT ARM BASILIC VEIN TRANSPOSITION;  Surgeon: Rosetta Posner, MD;  Location: [facility];  Service: Vascular;  Laterality: Left;  . PERIPHERAL VASCULAR CATHETERIZATION N/A 05/13/2016   Procedure:  Dialysis/Perma Catheter Insertion;  Surgeon: Serafina Mitchell, MD;  Location: [facility];  Service: Cardiovascular;  Laterality: N/A;   Family History  Problem Relation Age of Onset  . Emphysema Mother   . Cirrhosis Father    Social History:  reports that he has quit smoking. He uses smokeless tobacco. He reports that he drinks about 1.8 oz of alcohol per week . He reports that he does not use drugs. No Known Allergies Prior to Admission medications   Medication Sig Start Date End Date Taking? Authorizing Provider  ipratropium-albuterol (DUONEB) 0.5-2.5 (3) MG/3ML SOLN Take 3 mLs by nebulization every 4 (four) hours as needed. 05/21/16  Yes Theodis Blaze, MD  isosorbide-hydrALAZINE (BIDIL) 20-37.5 MG tablet Take 1 tablet by mouth 3 (three) times daily. 05/21/16  Yes Theodis Blaze, MD  carvedilol (COREG) 25 MG tablet Take 1 tablet (25 mg total) by mouth 2 (two) times daily with a meal. Patient not taking: Reported on 07/05/2016 02/14/14 07/05/16  Roselee Culver, MD   Current Facility-Administered Medications  Medication Dose Route Frequency Provider Last Rate Last Dose  . carvedilol (COREG) tablet 12.5 mg  12.5 mg Oral BID WC Eugenie Filler, MD   12.5 mg at 07/07/16 0703  . folic acid (FOLVITE) tablet 1 mg  1 mg Oral Daily Etta Quill, DO   1 mg at 07/07/16 1029  . heparin injection 5,000 Units  5,000 Units Subcutaneous Q8H Etta Quill, DO   5,000  Units at 07/07/16 0704  . ipratropium-albuterol (DUONEB) 0.5-2.5 (3) MG/3ML nebulizer solution 3 mL  3 mL Nebulization Q4H PRN Etta Quill, DO      . isosorbide-hydrALAZINE (BIDIL) 20-37.5 MG per tablet 1 tablet  1 tablet Oral TID Etta Quill, DO   1 tablet at 07/07/16 1029  . LORazepam (ATIVAN) tablet 1 mg  1 mg Oral Q6H PRN Etta Quill, DO       Or  . LORazepam (ATIVAN) injection 1 mg  1 mg Intravenous Q6H PRN Etta Quill, DO      . multivitamin with minerals tablet 1 tablet  1 tablet Oral Daily Etta Quill, DO    1 tablet at 07/07/16 1029  . sodium chloride flush (NS) 0.9 % injection 3 mL  3 mL Intravenous Q12H Etta Quill, DO   3 mL at 07/07/16 1000  . thiamine (VITAMIN B-1) tablet 100 mg  100 mg Oral Daily Etta Quill, DO   100 mg at 07/07/16 1029   Labs: Basic Metabolic Panel:  Recent Labs Lab 07/05/16 1810 07/07/16 0217  NA 136 136  K 4.4 4.2  CL 96* 95*  CO2 27 24  GLUCOSE 74 79  BUN 23* 47*  CREATININE 7.08* 11.38*  CALCIUM 8.6* 8.7*   Liver Function Tests:  Recent Labs Lab 07/05/16 1810  AST 30  ALT 12*  ALKPHOS 64  BILITOT 1.4*  PROT 9.2*  ALBUMIN 3.3*   CBC:  Recent Labs Lab 07/05/16 1810  WBC 5.1  HGB 14.8  HCT 43.3  MCV 92.7  PLT 246   CBG:  Recent Labs Lab 07/06/16 1647 07/06/16 2004 07/06/16 2354 07/07/16 0606 07/07/16 1116  GLUCAP 113* 99 95 86 107*   Studies/Results: Dg Chest Port 1 View  Result Date: 07/05/2016 CLINICAL DATA:  Dizziness.  Dialysis today. EXAM: PORTABLE CHEST 1 VIEW COMPARISON:  May 15, 2016 FINDINGS: A double lumen dialysis catheter is identified on the right in good position. The heart, hila, mediastinum, lungs, and pleura are normal. IMPRESSION: No active disease. Electronically Signed   By: Dorise Bullion III M.D   On: 07/05/2016 19:06    ROS: As per HPI otherwise negative.  Physical Exam: Vitals:   07/07/16 0920 07/07/16 0921 07/07/16 0923 07/07/16 1354  BP: 136/81 (!) 144/78 131/79 (!) 116/58  Pulse:    75  Resp:    18  Temp:    97.8 F (36.6 C)  TempSrc:    Oral  SpO2:    99%  Weight:      Height:         General: somewhat chronically ill appearing male NAD Head: NCAT sclera not icteric MMM Neck: Supple. No JVD Lungs: CTA bilaterally without wheezes, rales, or rhonchi. Breathing is unlabored. Heart: RRR  NSR on monitor Abdomen: soft NT + BS Lower extremities:without edema or ischemic changes, no open wounds  Neuro: A & O  X 3. Moves all extremities spontaneously. Psych:  Not very  engaging Dialysis Access: maturing left upper AVF placed 7/19 - looks good and right IJ  Dialysis Orders: [facility] TTS 4.25 hours 400/800 2 K 2 Ca heparin 2400 EDW 77.5 - gets +/- calcitriol 0.75 no Mircera, venofer 50 / week s/p full course net UF goals generally 2- 3 kg Recent labs:  hgb 12.7 17% sat iPTH 511 Ca/P within goal - no med list in [place] NEEDS MED in [place] after discharge  Assessment/Plan: 1. NSVT -prev hx afib- s/p lexiscan -  results pending- trop 0.03 2. ESRD -  TTS - K 4.2 HD Tuesday first round; hopefully can use AVF soon 3. Hypertension/volume  - controlled with HD/current meds 4. Anemia  -  hgb 14.8 - hold Fe for now - resume after d/c 5. Metabolic bone disease -  Continue calcitriol - not on binders yet 6. Nutrition - renal diet + vitamin - add nepro 7. Alcohol abuse - on thiamine 8. Hypoglycemic episode - hadn't eaten prior to this; BS 70 s in hospital/vits  Myriam Jacobson, PA-C [place] [phone] 07/07/2016, 2:43 PM

## 2016-07-07 NOTE — Progress Notes (Signed)
PROGRESS NOTE    Stpehen Petitjean  JOA:[id number] DOB: 1956/08/10 DOA: 07/05/2016 PCP: No primary care provider on file.   Brief Narrative:  Kenneth [name] is a 60 y.o. male with medical history significant of ESRD dialysis TTS, CHF with EF 25-30%, EtOH abuse last drink 2 days ago. He left dialysis center today after dialysis and was walking to bus stop when he became lightheaded and near-syncope episode.  EMS was called and his BGL was 42, improved with PO glucose.  Patient does not have a history of DM and is on no BGL lowering medications.  He thinks his BGL may have been low due to not eating at all today he says.  Has chronic back pain but no injury with near-syncopal episode today.  ED Course: During his stay in the ED his BGL has remained stable but he did have a run of V.Tach on the monitor for about 9-10 beats.   Assessment & Plan:   Principal Problem:   NSVT (nonsustained ventricular tachycardia) (HCC) Active Problems:   Near syncope   Systolic and diastolic CHF, chronic (HCC)   Essential hypertension   ESRD (end stage renal disease) ([place])   Alcohol abuse   Hypoglycemia   Cardiomyopathy ([place])  #1 nonsustained V. tach Questionable etiology. Patient with 2-D echo done in July 2017 with a EF of 25-30% with diffuse hypokinesis, and moderate right ventricular systolic dysfunction. Patient was resumed on half home dose Coreg. Patient has been seen by cardiology and doubt if this contribution to patient's pre-syncope. Patient's blood pressure is borderline. Myoview stress test pending. Per cardiology.  #2 near syncope Questionable etiology. May have been secondary to hypoglycemia as patient was noted to have CBGs in the 40s secondary to not eating. Patient with no further episodes. Cardiology doubts if near syncope is related to nonsustained V. tach. Patient for Myoview stress test. CBGs have improved on diet. Patient with no focal neurological deficits. Check carotid Dopplers.  Follow.  #3 end-stage renal disease on hemodialysis Tuesday Thursday Saturday  Consulted with nephrology.  #4 cardiomyopathy Questionable etiology. Myoview stress test pending. Continue beta blocker. Patient's blood pressure is borderline. Per cardiology.  #5 hypoglycemia Secondary to not eating. CBGs improved. Continue current diet and follow.  #6 hypertension Blood pressure borderline. Patient currently on half home dose Coreg. Follow.  #7 chronic combined systolic and diastolic heart failure Stable. Patient with no signs of volume overload. Patient with end-stage renal disease on hemodialysis.   DVT prophylaxis: Heparin Code Status: Full Family Communication: Updated patient. No family at bedside. Disposition Plan: Pending Myoview stress test. Probably home in the next 24-48 hours.   Consultants:   Cardiology: Dr. Percival Spanish 07/06/2016  Procedures:   Myoview stress test 07/07/2016  Antimicrobials:   None   Subjective: Patient sleeping. Easily arousable. No shortness of breath. No chest pain. Just return for Myoview stress test.  Objective: Vitals:   07/07/16 0904 07/07/16 0920 07/07/16 0921 07/07/16 0923  BP: (!) 144/90 136/81 (!) 144/78 131/79  Pulse:      Resp:      Temp:      TempSrc:      SpO2:      Weight:      Height:        Intake/Output Summary (Last 24 hours) at 07/07/16 1239 Last data filed at 07/06/16 1943  Gross per 24 hour  Intake              360 ml  Output  0 ml  Net              360 ml   Filed Weights   07/06/16 0200  Weight: 76.6 kg (168 lb 14 oz)    Examination:  General exam: Appears calm and comfortable  Respiratory system: Clear to auscultation. Respiratory effort normal. Cardiovascular system: S1 & S2 heard, RRR. No JVD, murmurs, rubs, gallops or clicks. No pedal edema. Gastrointestinal system: Abdomen is nondistended, soft and nontender. No organomegaly or masses felt. Normal bowel sounds heard. Central  nervous system: Alert and oriented. No focal neurological deficits. Extremities: Symmetric 5 x 5 power. Skin: No rashes, lesions or ulcers Psychiatry: Judgement and insight appear normal. Mood & affect appropriate.     Data Reviewed: I have personally reviewed following labs and imaging studies  CBC:  Recent Labs Lab 07/05/16 1810  WBC 5.1  HGB 14.8  HCT 43.3  MCV 92.7  PLT 259   Basic Metabolic Panel:  Recent Labs Lab 07/05/16 1810 07/07/16 0217  NA 136 136  K 4.4 4.2  CL 96* 95*  CO2 27 24  GLUCOSE 74 79  BUN 23* 47*  CREATININE 7.08* 11.38*  CALCIUM 8.6* 8.7*  MG  --  2.1   GFR: Estimated Creatinine Clearance: 6.9 mL/min (by C-G formula based on SCr of 11.38 mg/dL). Liver Function Tests:  Recent Labs Lab 07/05/16 1810  AST 30  ALT 12*  ALKPHOS 64  BILITOT 1.4*  PROT 9.2*  ALBUMIN 3.3*   No results for input(s): LIPASE, AMYLASE in the last 168 hours. No results for input(s): AMMONIA in the last 168 hours. Coagulation Profile: No results for input(s): INR, PROTIME in the last 168 hours. Cardiac Enzymes: No results for input(s): CKTOTAL, CKMB, CKMBINDEX, TROPONINI in the last 168 hours. BNP (last 3 results) No results for input(s): PROBNP in the last 8760 hours. HbA1C: No results for input(s): HGBA1C in the last 72 hours. CBG:  Recent Labs Lab 07/06/16 1647 07/06/16 2004 07/06/16 2354 07/07/16 0606 07/07/16 1116  GLUCAP 113* 99 95 86 107*   Lipid Profile: No results for input(s): CHOL, HDL, LDLCALC, TRIG, CHOLHDL, LDLDIRECT in the last 72 hours. Thyroid Function Tests: No results for input(s): TSH, T4TOTAL, FREET4, T3FREE, THYROIDAB in the last 72 hours. Anemia Panel: No results for input(s): VITAMINB12, FOLATE, FERRITIN, TIBC, IRON, RETICCTPCT in the last 72 hours. Sepsis Labs: No results for input(s): PROCALCITON, LATICACIDVEN in the last 168 hours.  Recent Results (from the past 240 hour(s))  MRSA PCR Screening     Status: None    Collection Time: 07/06/16  6:00 AM  Result Value Ref Range Status   MRSA by PCR NEGATIVE NEGATIVE Final    Comment:        The GeneXpert MRSA Assay (FDA approved for NASAL specimens only), is one component of a comprehensive MRSA colonization surveillance program. It is not intended to diagnose MRSA infection nor to guide or monitor treatment for MRSA infections.          Radiology Studies: Dg Chest Port 1 View  Result Date: 07/05/2016 CLINICAL DATA:  Dizziness.  Dialysis today. EXAM: PORTABLE CHEST 1 VIEW COMPARISON:  May 15, 2016 FINDINGS: A double lumen dialysis catheter is identified on the right in good position. The heart, hila, mediastinum, lungs, and pleura are normal. IMPRESSION: No active disease. Electronically Signed   By: Dorise Bullion III M.D   On: 07/05/2016 19:06        Scheduled Meds: . carvedilol  12.5  mg Oral BID WC  . folic acid  1 mg Oral Daily  . heparin  5,000 Units Subcutaneous Q8H  . isosorbide-hydrALAZINE  1 tablet Oral TID  . multivitamin with minerals  1 tablet Oral Daily  . sodium chloride flush  3 mL Intravenous Q12H  . thiamine  100 mg Oral Daily   Continuous Infusions:    LOS: 0 days    Time spent: 45 mins    Tejay Hubert, MD [facility] Pager [phone]  If 7PM-7AM, please contact night-coverage [email or web address] Password [facility] 07/07/2016, 12:39 PM

## 2016-07-07 NOTE — Progress Notes (Signed)
Pt had 14 beats of Nonsustained Vtach. Patient asymptomatic, resting in bed. Cardiology PA notified. MD notified. Order to increase coreg. Coreg given. Will continue to monitor.  Domingo Dimes RN

## 2016-07-07 NOTE — Progress Notes (Signed)
Patient Name: Kenneth [name] Date of Encounter: 07/07/2016  Hospital Problem List     Principal Problem:   NSVT (nonsustained ventricular tachycardia) (HCC) Active Problems:   Systolic and diastolic CHF, chronic (HCC)   Essential hypertension   ESRD (end stage renal disease) ([place])   Alcohol abuse   Hypoglycemia    Subjective   Seen in Nuc med. No chest pain or dyspnea.  Inpatient Medications    . carvedilol  12.5 mg Oral BID WC  . folic acid  1 mg Oral Daily  . heparin  5,000 Units Subcutaneous Q8H  . isosorbide-hydrALAZINE  1 tablet Oral TID  . multivitamin with minerals  1 tablet Oral Daily  . sodium chloride flush  3 mL Intravenous Q12H  . thiamine  100 mg Oral Daily    Vital Signs    Vitals:   07/06/16 2323 07/07/16 0500 07/07/16 0703 07/07/16 0904  BP: 134/86 128/68 118/83 (!) 144/90  Pulse: 77 90 83   Resp:  19    Temp:  97.6 F (36.4 C)    TempSrc:  Oral    SpO2:  100%    Weight:      Height:        Intake/Output Summary (Last 24 hours) at 07/07/16 0919 Last data filed at 07/06/16 1943  Gross per 24 hour  Intake              360 ml  Output                0 ml  Net              360 ml   Filed Weights   07/06/16 0200  Weight: 168 lb 14 oz (76.6 kg)    Physical Exam    General: Pleasant AA male, NAD. Neuro: Alert and oriented X 3. Moves all extremities spontaneously. Psych: Normal affect. HEENT:  Normal  Neck: Supple without bruits or JVD. Lungs:  Resp regular and unlabored, CTA. Heart: RRR no s3, s4, or murmurs. Abdomen: Soft, non-tender, non-distended, BS + x 4.  Extremities: No clubbing, cyanosis or edema. DP/PT/Radials 2+ and equal bilaterally. LUE fistula  Labs    CBC  Recent Labs  07/05/16 1810  WBC 5.1  HGB 14.8  HCT 43.3  MCV 92.7  PLT 301   Basic Metabolic Panel  Recent Labs  07/05/16 1810 07/07/16 0217  NA 136 136  K 4.4 4.2  CL 96* 95*  CO2 27 24  GLUCOSE 74 79  BUN 23* 47*  CREATININE 7.08* 11.38*  CALCIUM  8.6* 8.7*  MG  --  2.1   Liver Function Tests  Recent Labs  07/05/16 1810  AST 30  ALT 12*  ALKPHOS 64  BILITOT 1.4*  PROT 9.2*  ALBUMIN 3.3*   Telemetry    N/A  ECG    A-Flutter  Radiology    TTE: 05/12/16 Left ventricle: The cavity size was mildly dilated. There was   moderate concentric hypertrophy. Systolic function was severely   reduced. The estimated ejection fraction was in the range of 25%   to 30%. Diffuse hypokinesis. The study was not technically   sufficient to allow evaluation of LV diastolic dysfunction due to   atrial flutter. - Aortic root: The aortic root was normal in size. - Ascending aorta: The ascending aorta was normal in size. - Mitral valve: There was moderate regurgitation. - Left atrium: The atrium was severely dilated. - Right ventricle: The cavity size was  mildly dilated. Wall   thickness was normal. Systolic function was moderately reduced. - Right atrium: The atrium was moderately dilated. - Tricuspid valve: There was moderate-severe regurgitation. - Pulmonary arteries: Systolic pressure was severely increased. PA   peak pressure: 62 mm Hg (S). - Inferior vena cava: The vessel was normal in size. - Pericardium, extracardiac: A trivial pericardial effusion was   identified posterior to the heart. Features were not consistent   with tamponade physiology.  Impressions:  - When compared to the prior study from 12/09/2013 LVEF has   decreased, previously 60-65%, now 25-30% with diffuse   hypokinesis.   There is also a moderate RV systolic dysfunction.   Severe pulmonary hypertension is new.    Assessment & Plan    1. CARDIOMYOPATHY:    This is presumed to be non ischemic. Seen in nuc med, no reports of chest pain.  2. NSVT:    Doubt that this was contributory to his presyncope.  Plan ischemia work up.  Medical management pending this outcome with med titration as BP allows.  Currently he only seems to be tolerating the  Coreg.  3. CKD:   Per renal.   4. HTN:   This is being managed in the context of treating his CHF. BP stable today  5. ATRIAL FLUTTER:  This patients CHA2DS2-VASc Score and unadjusted Ischemic Stroke Rate (% per year) is equal to 4 % stroke rate/year from a score of 4.  However, he was not thought to be an anticoagulation candidate.  Continue with rate control.    Signed, Egan Berkheimer NP-C Pager [phone]   The patient was seen, examined and discussed with Reino Bellis, NP-C and I agree with the above.   60 year old male with new dg of cardiomyopathy, LVEF 60-65% in 2015, now 25-30%, stress test negative for ischemia, no cath is indicated. Telemetry shows frequent PVCs, couplets, 1 triplet, he is not a good candidate for an ICD implantation given CKD stage V on HD. I would increase carvedilol to 25 mg po BID. The patient can be discharged, we will arrange for an outpatient follow up and repeat echocardiogram in 6 weeks.   Ena Dawley, MD 07/07/2016

## 2016-07-07 NOTE — Progress Notes (Signed)
   Kenneth [name] presented for a Lexiscan cardiolite today.  No immediate complications.  Stress imaging is pending at this time.  Reino Bellis, NP 07/07/2016, 9:32 AM

## 2016-07-08 ENCOUNTER — Encounter (HOSPITAL_COMMUNITY): Payer: Self-pay

## 2016-07-08 ENCOUNTER — Other Ambulatory Visit: Payer: Self-pay | Admitting: Physician Assistant

## 2016-07-08 DIAGNOSIS — I5030 Unspecified diastolic (congestive) heart failure: Secondary | ICD-10-CM

## 2016-07-08 DIAGNOSIS — I5042 Chronic combined systolic (congestive) and diastolic (congestive) heart failure: Secondary | ICD-10-CM

## 2016-07-08 LAB — RENAL FUNCTION PANEL
ALBUMIN: 2.9 g/dL — AB (ref 3.5–5.0)
ANION GAP: 16 — AB (ref 5–15)
BUN: 66 mg/dL — ABNORMAL HIGH (ref 6–20)
CO2: 23 mmol/L (ref 22–32)
Calcium: 8.6 mg/dL — ABNORMAL LOW (ref 8.9–10.3)
Chloride: 96 mmol/L — ABNORMAL LOW (ref 101–111)
Creatinine, Ser: 13.8 mg/dL — ABNORMAL HIGH (ref 0.61–1.24)
GFR calc Af Amer: 4 mL/min — ABNORMAL LOW (ref >60)
GFR calc non Af Amer: 3 mL/min — ABNORMAL LOW (ref >60)
GLUCOSE: 91 mg/dL (ref 65–99)
PHOSPHORUS: 8.3 mg/dL — AB (ref 2.5–4.6)
POTASSIUM: 4.4 mmol/L (ref 3.5–5.1)
Sodium: 135 mmol/L (ref 135–145)

## 2016-07-08 LAB — CBC
HEMATOCRIT: 34.2 % — AB (ref 39.0–52.0)
HEMOGLOBIN: 11.4 g/dL — AB (ref 13.0–17.0)
MCH: 30.3 pg (ref 26.0–34.0)
MCHC: 33.3 g/dL (ref 30.0–36.0)
MCV: 91 fL (ref 78.0–100.0)
Platelets: 229 10*3/uL (ref 150–400)
RBC: 3.76 MIL/uL — ABNORMAL LOW (ref 4.22–5.81)
RDW: 15.5 % (ref 11.5–15.5)
WBC: 5.2 10*3/uL (ref 4.0–10.5)

## 2016-07-08 LAB — GLUCOSE, CAPILLARY
GLUCOSE-CAPILLARY: 82 mg/dL (ref 65–99)
Glucose-Capillary: 88 mg/dL (ref 65–99)
Glucose-Capillary: 82 mg/dL (ref 65–99)
Glucose-Capillary: 120 mg/dL — ABNORMAL HIGH (ref 65–99)

## 2016-07-08 MED ORDER — NEPRO/CARBSTEADY PO LIQD: 237.0000 mL | Freq: Two times a day (BID) | ORAL | 0 refills | Status: DC

## 2016-07-08 MED ORDER — CARVEDILOL 25 MG PO TABS: 25.0000 mg | ORAL_TABLET | Freq: Two times a day (BID) | ORAL | 3 refills | Status: DC

## 2016-07-08 MED ORDER — SODIUM CHLORIDE 0.9 % IV SOLN: 100.0000 mL | INTRAVENOUS | Status: DC | PRN

## 2016-07-08 MED ORDER — CALCIUM ACETATE (PHOS BINDER) 667 MG PO CAPS: 1334.0000 mg | ORAL_CAPSULE | Freq: Three times a day (TID) | ORAL | 3 refills | Status: DC

## 2016-07-08 MED ORDER — CALCITRIOL 0.5 MCG PO CAPS
ORAL_CAPSULE | ORAL | Status: AC
  Filled 2016-07-08: qty 1

## 2016-07-08 MED ORDER — HEPARIN SODIUM (PORCINE) 1000 UNIT/ML DIALYSIS: 1000.0000 [IU] | INTRAMUSCULAR | Status: DC | PRN

## 2016-07-08 MED ORDER — CALCIUM ACETATE (PHOS BINDER) 667 MG PO CAPS
1334.0000 mg | ORAL_CAPSULE | Freq: Three times a day (TID) | ORAL | Status: DC
  Administered 2016-07-08 – 2016-07-09 (×3): 1334 mg via ORAL
  Filled 2016-07-08 (×5): qty 2

## 2016-07-08 MED ORDER — LIDOCAINE-PRILOCAINE 2.5-2.5 % EX CREA: 1.0000 "application " | TOPICAL_CREAM | CUTANEOUS | Status: DC | PRN

## 2016-07-08 MED ORDER — CALCITRIOL 0.25 MCG PO CAPS: 0.7500 ug | ORAL_CAPSULE | ORAL | 0 refills | Status: DC

## 2016-07-08 MED ORDER — CALCITRIOL 0.25 MCG PO CAPS
ORAL_CAPSULE | ORAL | Status: AC
  Administered 2016-07-08: 0.75 ug
  Filled 2016-07-08: qty 1

## 2016-07-08 MED ORDER — FOLIC ACID 1 MG PO TABS: 1.0000 mg | ORAL_TABLET | Freq: Every day | ORAL | Status: DC

## 2016-07-08 MED ORDER — PENTAFLUOROPROP-TETRAFLUOROETH EX AERO: 1.0000 "application " | INHALATION_SPRAY | CUTANEOUS | Status: DC | PRN

## 2016-07-08 MED ORDER — THIAMINE HCL 100 MG PO TABS: 100.0000 mg | ORAL_TABLET | Freq: Every day | ORAL | Status: DC

## 2016-07-08 MED ORDER — ALTEPLASE 2 MG IJ SOLR: 2.0000 mg | Freq: Once | INTRAMUSCULAR | Status: DC | PRN

## 2016-07-08 MED ORDER — LIDOCAINE HCL (PF) 1 % IJ SOLN: 5.0000 mL | INTRAMUSCULAR | Status: DC | PRN

## 2016-07-08 MED ORDER — HEPARIN SODIUM (PORCINE) 1000 UNIT/ML DIALYSIS: 20.0000 [IU]/kg | INTRAMUSCULAR | Status: DC | PRN

## 2016-07-08 MED ORDER — ADULT MULTIVITAMIN W/MINERALS CH: 1.0000 | ORAL_TABLET | Freq: Every day | ORAL | 0 refills | Status: DC

## 2016-07-08 NOTE — Progress Notes (Signed)
Was called per nursing that patient significantly weak, as RN was preparing to discharge patient. It was noted that patient's systolic BP was in 32/35-- 105. Patient post hemodialysis today. Will cancel discharge order and monitor overnight.

## 2016-07-08 NOTE — Procedures (Signed)
Patient was seen on dialysis and the procedure was supervised.  BFR 325  Via PC BP is  96/69.   Patient appears to be tolerating treatment well  Kenneth [name] A 07/08/2016

## 2016-07-08 NOTE — Progress Notes (Signed)
Patient Name: Kenneth [name] Date of Encounter: 07/08/2016  Hospital Problem List     Principal Problem:   NSVT (nonsustained ventricular tachycardia) (HCC) Active Problems:   Systolic and diastolic CHF, chronic (HCC)   Essential hypertension   ESRD (end stage renal disease) ([place])   Alcohol abuse   Hypoglycemia   Near syncope   Cardiomyopathy ([place])    Subjective   The patient denies any chest pain or dizziness, he is at the hemodialysis and feels tired.  Inpatient Medications    . calcitRIOL  0.75 mcg Oral Q T,Th,Sa-HD  . calcium acetate  1,334 mg Oral TID WC  . carvedilol  25 mg Oral BID WC  . feeding supplement (NEPRO CARB STEADY)  237 mL Oral BID BM  . folic acid  1 mg Oral Daily  . heparin  5,000 Units Subcutaneous Q8H  . isosorbide-hydrALAZINE  1 tablet Oral TID  . multivitamin with minerals  1 tablet Oral Daily  . sodium chloride flush  3 mL Intravenous Q12H  . thiamine  100 mg Oral Daily    Vital Signs    Vitals:   07/08/16 0930 07/08/16 1000 07/08/16 1030 07/08/16 1104  BP: (!) 152/64 (!) 114/54 (!) 112/58 123/67  Pulse: 82 82 86 84  Resp: 17 17 18 17   Temp:    98 F (36.7 C)  TempSrc:    Oral  SpO2:    96%  Weight:    170 lb 13.7 oz (77.5 kg)  Height:        Intake/Output Summary (Last 24 hours) at 07/08/16 1145 Last data filed at 07/08/16 1104  Gross per 24 hour  Intake                0 ml  Output             1700 ml  Net            -1700 ml   Filed Weights   07/06/16 0200 07/08/16 0704 07/08/16 1104  Weight: 168 lb 14 oz (76.6 kg) 174 lb 9.7 oz (79.2 kg) 170 lb 13.7 oz (77.5 kg)    Physical Exam    General: Pleasant AA male, NAD. Neuro: Alert and oriented X 3. Moves all extremities spontaneously. Psych: Normal affect. HEENT:  Normal  Neck: Supple without bruits or JVD. Lungs:  Resp regular and unlabored, CTA. Heart: RRR no s3, s4, or murmurs. Abdomen: Soft, non-tender, non-distended, BS + x 4.  Extremities: No clubbing, cyanosis or  edema. DP/PT/Radials 2+ and equal bilaterally. LUE fistula  Labs    CBC  Recent Labs  07/05/16 1810 07/08/16 0500  WBC 5.1 5.2  HGB 14.8 11.4*  HCT 43.3 34.2*  MCV 92.7 91.0  PLT 246 782   Basic Metabolic Panel  Recent Labs  07/07/16 0217 07/08/16 0500  NA 136 135  K 4.2 4.4  CL 95* 96*  CO2 24 23  GLUCOSE 79 91  BUN 47* 66*  CREATININE 11.38* 13.80*  CALCIUM 8.7* 8.6*  MG 2.1  --   PHOS  --  8.3*   Liver Function Tests  Recent Labs  07/05/16 1810 07/08/16 0500  AST 30  --   ALT 12*  --   ALKPHOS 64  --   BILITOT 1.4*  --   PROT 9.2*  --   ALBUMIN 3.3* 2.9*   Telemetry    N/A  ECG    A-Flutter  Radiology    TTE: 05/12/16 Left ventricle: The  cavity size was mildly dilated. There was   moderate concentric hypertrophy. Systolic function was severely   reduced. The estimated ejection fraction was in the range of 25%   to 30%. Diffuse hypokinesis. The study was not technically   sufficient to allow evaluation of LV diastolic dysfunction due to   atrial flutter. - Aortic root: The aortic root was normal in size. - Ascending aorta: The ascending aorta was normal in size. - Mitral valve: There was moderate regurgitation. - Left atrium: The atrium was severely dilated. - Right ventricle: The cavity size was mildly dilated. Wall   thickness was normal. Systolic function was moderately reduced. - Right atrium: The atrium was moderately dilated. - Tricuspid valve: There was moderate-severe regurgitation. - Pulmonary arteries: Systolic pressure was severely increased. PA   peak pressure: 62 mm Hg (S). - Inferior vena cava: The vessel was normal in size. - Pericardium, extracardiac: A trivial pericardial effusion was   identified posterior to the heart. Features were not consistent   with tamponade physiology.  Impressions:  - When compared to the prior study from 12/09/2013 LVEF has   decreased, previously 60-65%, now 25-30% with diffuse    hypokinesis.   There is also a moderate RV systolic dysfunction.   Severe pulmonary hypertension is new.    Assessment & Plan    1. CARDIOMYOPATHY:    non ischemic, no ischemia on the stress test yesterday. LVEF 60-65% in 2015, now 25-30%, stress test negative for ischemia, no cath is indicated.  2. NSVT:    Doubt that this was contributory to his presyncope. No ischemia. Telemetry shows frequent PVCs, couplets, 1 triplet, he is not a good candidate for an ICD implantation given CKD stage V on HD. Ectopy improved on increased carvedilol.  3. CKD:   Per renal.   4. HTN:  Improved after increased carvedilol.   5. ATRIAL FLUTTER:  This patients CHA2DS2-VASc Score and unadjusted Ischemic Stroke Rate (% per year) is equal to 4 % stroke rate/year from a score of 4.  However, he was not thought to be an anticoagulation candidate.  Continue with rate control.    The patient can be discharged, we will arrange for an outpatient follow up and repeat echocardiogram in 6 weeks.   Ena Dawley, MD 07/08/2016

## 2016-07-08 NOTE — Progress Notes (Signed)
Pt daughter, Bonita Quin, called and notified that pt is being discharged and he asked [place] to call her for ride.  She said she could be here in about 55min.

## 2016-07-08 NOTE — Discharge Summary (Addendum)
Physician Discharge Summary  Kenneth [name] DPO:[id number] DOB: 25-Feb-1956 DOA: 07/05/2016  PCP: No primary care provider on file.  Admit date: 07/05/2016 Discharge date: 07/08/2016  Time spent: 65 minutes  Recommendations for Outpatient Follow-up:  1. Follow-up with [facility] on Thursday, 07/10/2016 for regular hemodialysis. 2. Follow-up with PCP in 2 weeks.Patient will need to be referred for a thyroid ultrasound done for further evaluation of radioactivity noted in the neck region per stress Myoview. 3. Follow-up with Lesia Hausen, PA cardiology on 07/22/2016 at 10 AM for hospital follow-up. 4. Follow-up with cardiology on 08/19/2016 for repeat 2-D echo.   Discharge Diagnoses:  Principal Problem:   NSVT (nonsustained ventricular tachycardia) (HCC) Active Problems:   Near syncope   Systolic and diastolic CHF, chronic (HCC)   Essential hypertension   ESRD (end stage renal disease) ([place])   Alcohol abuse   Hypoglycemia   Cardiomyopathy ([place])   Discharge Condition: Stable and improved.  Diet recommendation: Heart healthy/renal diet.  Filed Weights   07/06/16 0200 07/08/16 0704 07/08/16 1104  Weight: 76.6 kg (168 lb 14 oz) 79.2 kg (174 lb 9.7 oz) 77.5 kg (170 lb 13.7 oz)    History of present illness:  Per Dr Tyron Russell is a 60 y.o. male with medical history significant of ESRD dialysis TTS, CHF with EF 25-30%, EtOH abuse last drink 2 days ago. He left dialysis center today after dialysis and was walking to bus stop when he became lightheaded and near-syncope episode.  EMS was called and his BGL was 42, improved with PO glucose.  Patient does not have a history of DM and is on no BGL lowering medications.  He thinks his BGL may have been low due to not eating at all today he says.  Has chronic back pain but no injury with near-syncopal episode today.  ED Course: During his stay in the ED his BGL has remained stable but he did have a run of V.Tach on the monitor for about 9-10  beats.   Hospital Course:  #1 nonsustained V. tach Questionable etiology. Patient with 2-D echo done in July 2017 with a EF of 25-30% with diffuse hypokinesis, and moderate right ventricular systolic dysfunction. Patient was resumed on half home dose Coreg and dose further increased as patient had some more asymptomatic nonsustained V. tach. Patient was maintained on Coreg at 25 mg twice daily with no further episodes of nonsustained V. tach. Patient underwent a Myoview stress test which was negative for any ischemia. Telemetry post adjustment of Coreg just showed frequent PVCs, couplets, one triplet. It was felt per cardiology that patient was not a good candidate for an ICD implantation given his end-stage renal disease on hemodialysis. Patient will follow-up with cardiology and outpatient setting.   #2 near syncope May have been secondary to hypoglycemia as patient was noted to have CBGs in the 40s secondary to not eating. Patient with no further episodes. Due to nonsustained V. tach noted in the emergency room on admission patient was admitted to the hospital for further evaluation and cardiology consulted. Cardiology doubts if near syncope is related to nonsustained V. tach. Patient underwent Myoview stress test on 07/07/2016 which was negative for ischemia with a EF of 30-44%. Radioactivity noted in the neck region. Patient was maintained on Coreg. Patient was placed on a diet did not have any further hypoglycemic spells and no further near-syncope spells. Patient had no focal neurological deficits. Outpatient follow-up.  #3 end-stage renal disease on hemodialysis Tuesday Thursday Saturday  Consulted with nephrology. Patient underwent hemodialysis on Tuesday, 07/08/2016. Patient will follow-up at his hemodialysis center at [facility] on Thursday, 07/10/2016.  #4 cardiomyopathy Felt likely to be nonischemic. Patient underwent a Myoview stress test which was negative for ischemia, with a EF of 30-44%.  Patient was maintained on beta blocker and followed by cardiology during the hospitalization. It was felt per cardiology that no cardiac catheterization was indicated at this time. Patient will follow-up in the outpatient setting with cardiology with repeat echocardiogram in 6 weeks.  #5 hypoglycemia Secondary to not eating. Patient on admission was noted per EMS to have a CBG of 42. Patient was initially given glucose with improvement with his CBGs. Patient was admitted. Patient placed on a diet and CBGs remained stable. Patient had no further hypoglycemic episodes.  #6 hypertension Blood pressure borderline on admission. Patient's blood pressure improved. Patient was placed back on Coreg initiated half home dose and subsequently increased back to full dose of Coreg 25 mg twice daily which improved patient's nonsustained V. tach. Patient was also maintained on home regimen of BiDil. Outpatient follow-up.  #7 chronic combined systolic and diastolic heart failure Stable. Patient with no signs of volume overload. Patient with end-stage renal disease on hemodialysis. Patient underwent regular hemodialysis on 07/08/2016. Outpatient follow-up.  #8 atrial flutter-- CHA2DS2VASc 4 Patient was maintained on Coreg for rate control. It was felt patient in the past was not a anticoagulation candidate. Outpatient follow-up with cardiology.    Procedures:  Myoview stress test 07/07/2016--There was no ST segment deviation noted during stress.  Defect 1: There is a medium defect of moderate severity present in the basal inferior, mid inferior and apical inferior location.  Findings consistent with prior myocardial infarction and possible soft tissue attenuation. No significant ischemia  This is an intermediate risk study.  The left ventricular ejection fraction is moderately decreased (30-44%).  Radioactivity activity noted in neck region on raw images. Recommend thyroid ultrasound to define  Chest  x-ray 07/05/2016  Consultations:  Cardiology: Dr. Percival Spanish 07/06/2016  Nephrology: Dr. Posey Pronto 07/07/2016  Discharge Exam: Vitals:   07/08/16 1137 07/08/16 1321  BP: 127/75 (!) 115/55  Pulse: 76 77  Resp: 18 17  Temp: 97.4 F (36.3 C) 98 F (36.7 C)    General: NAD Cardiovascular: RRR Respiratory: CTAB  Discharge Instructions   Discharge Instructions    Diet - low sodium heart healthy    Complete by:  As directed   Renal diet.   Discharge instructions    Complete by:  As directed   Follow up at HD center on Thursday for regular dialysis. Cardiology office will call with outpatient appointment time. Stop drinking.   Increase activity slowly    Complete by:  As directed     Current Discharge Medication List    START taking these medications   Details  calcitRIOL (ROCALTROL) 0.25 MCG capsule Take 3 capsules (0.75 mcg total) by mouth Every Tuesday,Thursday,and Saturday with dialysis. Qty: 15 capsule, Refills: 0    calcium acetate (PHOSLO) 667 MG capsule Take 2 capsules (1,334 mg total) by mouth 3 (three) times daily with meals. Qty: 180 capsule, Refills: 3    folic acid (FOLVITE) 1 MG tablet Take 1 tablet (1 mg total) by mouth daily.    Multiple Vitamin (MULTIVITAMIN WITH MINERALS) TABS tablet Take 1 tablet by mouth daily. Qty: 30 tablet, Refills: 0    Nutritional Supplements (FEEDING SUPPLEMENT, NEPRO CARB STEADY,) LIQD Take 237 mLs by mouth 2 (two) times daily between  meals. Refills: 0    thiamine 100 MG tablet Take 1 tablet (100 mg total) by mouth daily.      CONTINUE these medications which have CHANGED   Details  carvedilol (COREG) 25 MG tablet Take 1 tablet (25 mg total) by mouth 2 (two) times daily with a meal. Qty: 60 tablet, Refills: 3      CONTINUE these medications which have NOT CHANGED   Details  ipratropium-albuterol (DUONEB) 0.5-2.5 (3) MG/3ML SOLN Take 3 mLs by nebulization every 4 (four) hours as needed. Qty: 360 mL, Refills: 1     isosorbide-hydrALAZINE (BIDIL) 20-37.5 MG tablet Take 1 tablet by mouth 3 (three) times daily. Qty: 90 tablet, Refills: 0       No Known Allergies Follow-up Information    Lyda Jester, PA-C Follow up on 07/22/2016.   Specialties:  Cardiology, Radiology Why:  See provider at 10:00 am, please arrive 15 minutes early for paperwork. Contact information: [address] [place] [address] [facility] Follow up on 08/19/2016.   Why:  Echocardiogram at 10:30 am, please arrive 15 minutes early for paperwork. Contact information: [place] [phone] [place] Follow up on 07/10/2016.   Why:  F/U FOR HD Contact information: [address] [place] [address] [phone]        PCP. Schedule an appointment as soon as possible for a visit in 2 week(s).   Why:  F/U WITH PCP IN 1-2 WEEKS.           The results of significant diagnostics from this hospitalization (including imaging, microbiology, ancillary and laboratory) are listed below for reference.    Significant Diagnostic Studies: Nm Myocar Multi W/spect W/wall Motion / Ef  Result Date: 07/07/2016  There was no ST segment deviation noted during stress.  Defect 1: There is a medium defect of moderate severity present in the basal inferior, mid inferior and apical inferior location.  Findings consistent with prior myocardial infarction and possible soft tissue attenuation. No significant ischemia  This is an intermediate risk study.  The left ventricular ejection fraction is moderately decreased (30-44%).  Radioactivity activity noted in neck region on raw images. Recommend thyroid ultrasound to define    Dg Chest Port 1 View  Result Date: 07/05/2016 CLINICAL DATA:  Dizziness.  Dialysis today. EXAM: PORTABLE CHEST 1 VIEW COMPARISON:  May 15, 2016 FINDINGS:  A double lumen dialysis catheter is identified on the right in good position. The heart, hila, mediastinum, lungs, and pleura are normal. IMPRESSION: No active disease. Electronically Signed   By: Dorise Bullion III M.D   On: 07/05/2016 19:06    Microbiology: Recent Results (from the past 240 hour(s))  MRSA PCR Screening     Status: None   Collection Time: 07/06/16  6:00 AM  Result Value Ref Range Status   MRSA by PCR NEGATIVE NEGATIVE Final    Comment:        The GeneXpert MRSA Assay (FDA approved for NASAL specimens only), is one component of a comprehensive MRSA colonization surveillance program. It is not intended to diagnose MRSA infection nor to guide or monitor treatment for MRSA infections.      Labs: Basic Metabolic Panel:  Recent Labs Lab 07/05/16 1810 07/07/16 0217 07/08/16 0500  NA 136 136 135  K 4.4 4.2 4.4  CL 96* 95* 96*  CO2 27 24 23   GLUCOSE 74 79 91  BUN 23* 47* 66*  CREATININE 7.08* 11.38* 13.80*  CALCIUM 8.6* 8.7* 8.6*  MG  --  2.1  --   PHOS  --   --  8.3*   Liver Function Tests:  Recent Labs Lab 07/05/16 1810 07/08/16 0500  AST 30  --   ALT 12*  --   ALKPHOS 64  --   BILITOT 1.4*  --   PROT 9.2*  --   ALBUMIN 3.3* 2.9*   No results for input(s): LIPASE, AMYLASE in the last 168 hours. No results for input(s): AMMONIA in the last 168 hours. CBC:  Recent Labs Lab 07/05/16 1810 07/08/16 0500  WBC 5.1 5.2  HGB 14.8 11.4*  HCT 43.3 34.2*  MCV 92.7 91.0  PLT 246 229   Cardiac Enzymes: No results for input(s): CKTOTAL, CKMB, CKMBINDEX, TROPONINI in the last 168 hours. BNP: BNP (last 3 results)  Recent Labs  05/12/16 0736  BNP 2,191.8*    ProBNP (last 3 results) No results for input(s): PROBNP in the last 8760 hours.  CBG:  Recent Labs Lab 07/07/16 1116 07/07/16 1624 07/07/16 2133 07/08/16 0613 07/08/16 1140  GLUCAP 107* 139* 84 88 82       Signed:  Leanndra Pember MD.  [facility] 07/08/2016,  5:14 PM

## 2016-07-08 NOTE — Progress Notes (Signed)
In room to transport to main entrance where his ride is awaiting.  He is sitting on bedside appearing very lethargic.  At baseline he has not been very interactive with staff, but he is having difficulty holding himself upright, barely able to raise arms to put on sweater.  Keeps wanting to flop back to lying position.  CBG assessed: 82, BP: 92/46.  Pt assisted to lying back in the bed via assistance of two staff members.  He states that he feels much weaker than he did when he was first admitted.  At this time I do not feel safe discharging pt to home.    MD called and notified of change in pt condition and current vitals/CBG.  Discharge orders have been cancelled for further monitoring.

## 2016-07-08 NOTE — Progress Notes (Signed)
Subjective:  Seen on HD- had another run of VT- cards following- myoview was intermediate risk Objective Vital signs in last 24 hours: Vitals:   07/08/16 0800 07/08/16 0830 07/08/16 0900 07/08/16 0930  BP: (!) 146/87 130/60 (!) 132/59 (!) 152/64  Pulse: 80 80 78 82  Resp: 17 17 18 17   Temp:      TempSrc:      SpO2:      Weight:      Height:       Weight change:  No intake or output data in the 24 hours ending 07/08/16 0954  Dialysis Orders: [facility] TTS 4.25 hours 400/800 2 K 2 Ca heparin 2400 EDW 77.5 - gets +/- calcitriol 0.75 no Mircera, venofer 50 / week s/p full course net UF goals generally 2- 3 kg Recent labs:  hgb 12.7 17% sat iPTH 511 Ca/P within goal - no med list in [facility] NEEDS MED in [place] after discharge  Assessment/Plan: 1. NSVT -prev hx afib- s/p lexiscan - intermediate risk- trop 0.03- cards involved- sounds like conservative management  2. ESRD -  TTS - K 4.2 HD today; hopefully can use AVF soon 3. Hypertension/volume  - controlled with HD/current meds- did not seem volume related his episode post HD 4. Anemia  -  hgb 14.8--11.4 - hold Fe for now - resume after d/c 5. Metabolic bone disease -  Continue calcitriol - not on binders yet- phos 8.4 so will start phoslo  6. Nutrition - renal diet + vitamin - add nepro 7. Alcohol abuse - on thiamine 8. Hypoglycemic episode - hadn't eaten prior to this; BS 70 s in hospital/vits    Kenneth [name] A    Labs: Basic Metabolic Panel:  Recent Labs Lab 07/05/16 1810 07/07/16 0217 07/08/16 0500  NA 136 136 135  K 4.4 4.2 4.4  CL 96* 95* 96*  CO2 27 24 23   GLUCOSE 74 79 91  BUN 23* 47* 66*  CREATININE 7.08* 11.38* 13.80*  CALCIUM 8.6* 8.7* 8.6*  PHOS  --   --  8.3*   Liver Function Tests:  Recent Labs Lab 07/05/16 1810 07/08/16 0500  AST 30  --   ALT 12*  --   ALKPHOS 64  --   BILITOT 1.4*  --   PROT 9.2*  --   ALBUMIN 3.3* 2.9*   No results for input(s): LIPASE, AMYLASE in the last 168 hours. No  results for input(s): AMMONIA in the last 168 hours. CBC:  Recent Labs Lab 07/05/16 1810 07/08/16 0500  WBC 5.1 5.2  HGB 14.8 11.4*  HCT 43.3 34.2*  MCV 92.7 91.0  PLT 246 229   Cardiac Enzymes: No results for input(s): CKTOTAL, CKMB, CKMBINDEX, TROPONINI in the last 168 hours. CBG:  Recent Labs Lab 07/07/16 0606 07/07/16 1116 07/07/16 1624 07/07/16 2133 07/08/16 0613  GLUCAP 86 107* 139* 84 88    Iron Studies: No results for input(s): IRON, TIBC, TRANSFERRIN, FERRITIN in the last 72 hours. Studies/Results: Nm Myocar Multi W/spect W/wall Motion / Ef  Result Date: 07/07/2016  There was no ST segment deviation noted during stress.  Defect 1: There is a medium defect of moderate severity present in the basal inferior, mid inferior and apical inferior location.  Findings consistent with prior myocardial infarction and possible soft tissue attenuation. No significant ischemia  This is an intermediate risk study.  The left ventricular ejection fraction is moderately decreased (30-44%).  Radioactivity activity noted in neck region on raw images. Recommend thyroid ultrasound  to define    Medications: Infusions:    Scheduled Medications: . calcitRIOL  0.75 mcg Oral Q T,Th,Sa-HD  . carvedilol  25 mg Oral BID WC  . feeding supplement (NEPRO CARB STEADY)  237 mL Oral BID BM  . folic acid  1 mg Oral Daily  . heparin  5,000 Units Subcutaneous Q8H  . isosorbide-hydrALAZINE  1 tablet Oral TID  . multivitamin with minerals  1 tablet Oral Daily  . sodium chloride flush  3 mL Intravenous Q12H  . thiamine  100 mg Oral Daily    have reviewed scheduled and prn medications.  Physical Exam: General: seems withdrawn- thinks he may go home today  Heart: RRR Lungs: mostly clear Abdomen: soft, non tender Extremities: no edema Dialysis Access: PC and AVF placed 7/19   07/08/2016,9:54 AM  LOS: 0 days

## 2016-07-09 ENCOUNTER — Observation Stay (HOSPITAL_BASED_OUTPATIENT_CLINIC_OR_DEPARTMENT_OTHER): Payer: Medicaid Other

## 2016-07-09 DIAGNOSIS — R55 Syncope and collapse: Secondary | ICD-10-CM

## 2016-07-09 LAB — VAS US CAROTID
LCCADDIAS: -14 cm/s
LCCAPDIAS: 21 cm/s
LCCAPSYS: 84 cm/s
LEFT ECA DIAS: -17 cm/s
LEFT VERTEBRAL DIAS: 14 cm/s
Left CCA dist sys: -45 cm/s
Left ICA dist dias: -23 cm/s
Left ICA dist sys: -50 cm/s
Left ICA prox dias: -11 cm/s
Left ICA prox sys: -29 cm/s
RCCADSYS: -59 cm/s
Right CCA prox dias: 9 cm/s
Right CCA prox sys: 88 cm/s

## 2016-07-09 LAB — GLUCOSE, CAPILLARY
GLUCOSE-CAPILLARY: 101 mg/dL — AB (ref 65–99)
Glucose-Capillary: 91 mg/dL (ref 65–99)

## 2016-07-09 LAB — RENAL FUNCTION PANEL
Albumin: 3 g/dL — ABNORMAL LOW (ref 3.5–5.0)
Anion gap: 12 (ref 5–15)
BUN: 37 mg/dL — AB (ref 6–20)
CHLORIDE: 95 mmol/L — AB (ref 101–111)
CO2: 25 mmol/L (ref 22–32)
CREATININE: 9.65 mg/dL — AB (ref 0.61–1.24)
Calcium: 9.1 mg/dL (ref 8.9–10.3)
GFR calc Af Amer: 6 mL/min — ABNORMAL LOW (ref >60)
GFR, EST NON AFRICAN AMERICAN: 5 mL/min — AB (ref >60)
Glucose, Bld: 79 mg/dL (ref 65–99)
Phosphorus: 5.8 mg/dL — ABNORMAL HIGH (ref 2.5–4.6)
Potassium: 4.2 mmol/L (ref 3.5–5.1)
Sodium: 132 mmol/L — ABNORMAL LOW (ref 135–145)

## 2016-07-09 LAB — CBC
HCT: 36.5 % — ABNORMAL LOW (ref 39.0–52.0)
Hemoglobin: 12.1 g/dL — ABNORMAL LOW (ref 13.0–17.0)
MCH: 30.3 pg (ref 26.0–34.0)
MCHC: 33.2 g/dL (ref 30.0–36.0)
MCV: 91.3 fL (ref 78.0–100.0)
PLATELETS: 235 10*3/uL (ref 150–400)
RBC: 4 MIL/uL — ABNORMAL LOW (ref 4.22–5.81)
RDW: 15.2 % (ref 11.5–15.5)
WBC: 5.6 10*3/uL (ref 4.0–10.5)

## 2016-07-09 LAB — HEPATITIS B SURFACE ANTIGEN: HEP B S AG: NEGATIVE

## 2016-07-09 MED ORDER — CARVEDILOL 12.5 MG PO TABS: 12.5000 mg | ORAL_TABLET | Freq: Two times a day (BID) | ORAL | Status: DC

## 2016-07-09 MED ORDER — CARVEDILOL 12.5 MG PO TABS: 12.5000 mg | ORAL_TABLET | Freq: Two times a day (BID) | ORAL | 0 refills | Status: DC

## 2016-07-09 MED ORDER — ISOSORB DINITRATE-HYDRALAZINE 20-37.5 MG PO TABS: 1.0000 | ORAL_TABLET | Freq: Two times a day (BID) | ORAL | Status: DC

## 2016-07-09 NOTE — Progress Notes (Signed)
Patient Name: Kenneth [name] Date of Encounter: 07/09/2016  Hospital Problem List     Principal Problem:   NSVT (nonsustained ventricular tachycardia) (HCC) Active Problems:   Systolic and diastolic CHF, chronic (HCC)   Essential hypertension   ESRD (end stage renal disease) ([place])   Alcohol abuse   Hypoglycemia   Near syncope   Cardiomyopathy ([place])    Subjective   The patient denies any chest pain or dizziness, however he was supposed to be discharged yesterday but felt tired and weak and his BP was low, feeling slightly better this am, but not back to baseline yet.  Inpatient Medications    . calcitRIOL  0.75 mcg Oral Q T,Th,Sa-HD  . calcium acetate  1,334 mg Oral TID WC  . carvedilol  12.5 mg Oral BID WC  . feeding supplement (NEPRO CARB STEADY)  237 mL Oral BID BM  . folic acid  1 mg Oral Daily  . heparin  5,000 Units Subcutaneous Q8H  . isosorbide-hydrALAZINE  1 tablet Oral BID  . multivitamin with minerals  1 tablet Oral Daily  . sodium chloride flush  3 mL Intravenous Q12H  . thiamine  100 mg Oral Daily    Vital Signs    Vitals:   07/08/16 1804 07/08/16 2026 07/08/16 2239 07/09/16 0540  BP: (!) 105/53 (!) 95/54 137/80 (!) 100/56  Pulse: 77 65 73 71  Resp:  18  18  Temp:  98.7 F (37.1 C)  98.5 F (36.9 C)  TempSrc:  Oral  Oral  SpO2: 100% 98%  95%  Weight:    169 lb 8.5 oz (76.9 kg)  Height:        Intake/Output Summary (Last 24 hours) at 07/09/16 1001 Last data filed at 07/08/16 1700  Gross per 24 hour  Intake              720 ml  Output             1700 ml  Net             -980 ml   Filed Weights   07/08/16 0704 07/08/16 1104 07/09/16 0540  Weight: 174 lb 9.7 oz (79.2 kg) 170 lb 13.7 oz (77.5 kg) 169 lb 8.5 oz (76.9 kg)    Physical Exam    General: Pleasant AA male, NAD. Neuro: Alert and oriented X 3. Moves all extremities spontaneously. Psych: Normal affect. HEENT:  Normal  Neck: Supple without bruits or JVD. Lungs:  Resp regular and  unlabored, CTA. Heart: RRR no s3, s4, or murmurs. Abdomen: Soft, non-tender, non-distended, BS + x 4.  Extremities: No clubbing, cyanosis or edema. DP/PT/Radials 2+ and equal bilaterally. LUE fistula  Labs    CBC  Recent Labs  07/08/16 0500 07/09/16 0301  WBC 5.2 5.6  HGB 11.4* 12.1*  HCT 34.2* 36.5*  MCV 91.0 91.3  PLT 229 595   Basic Metabolic Panel  Recent Labs  07/07/16 0217 07/08/16 0500 07/09/16 0301  NA 136 135 132*  K 4.2 4.4 4.2  CL 95* 96* 95*  CO2 24 23 25   GLUCOSE 79 91 79  BUN 47* 66* 37*  CREATININE 11.38* 13.80* 9.65*  CALCIUM 8.7* 8.6* 9.1  MG 2.1  --   --   PHOS  --  8.3* 5.8*   Liver Function Tests  Recent Labs  07/08/16 0500 07/09/16 0301  ALBUMIN 2.9* 3.0*   Telemetry    N/A  ECG    A-Flutter  Radiology  TTE: 05/12/16 Left ventricle: The cavity size was mildly dilated. There was   moderate concentric hypertrophy. Systolic function was severely   reduced. The estimated ejection fraction was in the range of 25%   to 30%. Diffuse hypokinesis. The study was not technically   sufficient to allow evaluation of LV diastolic dysfunction due to   atrial flutter. - Aortic root: The aortic root was normal in size. - Ascending aorta: The ascending aorta was normal in size. - Mitral valve: There was moderate regurgitation. - Left atrium: The atrium was severely dilated. - Right ventricle: The cavity size was mildly dilated. Wall   thickness was normal. Systolic function was moderately reduced. - Right atrium: The atrium was moderately dilated. - Tricuspid valve: There was moderate-severe regurgitation. - Pulmonary arteries: Systolic pressure was severely increased. PA   peak pressure: 62 mm Hg (S). - Inferior vena cava: The vessel was normal in size. - Pericardium, extracardiac: A trivial pericardial effusion was   identified posterior to the heart. Features were not consistent   with tamponade physiology.  Impressions:  - When  compared to the prior study from 12/09/2013 LVEF has   decreased, previously 60-65%, now 25-30% with diffuse   hypokinesis.   There is also a moderate RV systolic dysfunction.   Severe pulmonary hypertension is new.    Assessment & Plan    1. CARDIOMYOPATHY:    non ischemic, no ischemia on the stress test yesterday. LVEF 60-65% in 2015, now 25-30%, stress test negative for ischemia, no cath is indicated.  2. NSVT:    Doubt that this was contributory to his presyncope. No ischemia. Telemetry shows frequent PVCs, couplets, 1 triplet, he is not a good candidate for an ICD implantation given CKD stage V on HD. Ectopy improved on increased carvedilol.  3. CKD:   Per renal.   4. HTN:  hypotensive after HD and increasing carvedilol, hold BiDil.   5. ATRIAL FLUTTER:  This patients CHA2DS2-VASc Score and unadjusted Ischemic Stroke Rate (% per year) is equal to 4 % stroke rate/year from a score of 4.  However, he was not thought to be an anticoagulation candidate.  Continue with rate control.    The patient can be discharged, we will arrange for an outpatient follow up and repeat echocardiogram in 6 weeks.  If he feels better today and BP improved, he can be discharged.  Ena Dawley, MD 07/09/2016

## 2016-07-09 NOTE — Care Management Note (Signed)
Case Management Note Marvetta Gibbons RN, BSN Unit 2W-Case Manager [phone]  Patient Details  Name: Kenneth [name] MRN: [id number] Date of Birth: April 19, 1956  Subjective/Objective:  Pt admitted NSVT, hypotension- near syncope after HD-   HD days T/T/S                Action/Plan: PTA pt lived at home- Per PT notes pt would benefit from HHPT - however pt does not have insurance and falls under the Medicaid guidelines for [facility]- pt does not have a qualifying dx for HHPT under Medicaid- would have to pay out of pocket for any [facility] services for HHPT- Pt reports that he has 24hr assistance at home- spoke with pt and daughter at bedside- they are not interested in out of pocket expense for [facility] services- daughter states pt has a rollator that he uses at home- discussed medications- which the coreg is on $4 list at [facility]- pt was last used  [facility] 7/27 and 8/3 of 2017 and is not currently eligible for [facility] assistance- no further CM needs identified.   Expected Discharge Date:    07/09/16              Expected Discharge Plan:  Home/Self Care  In-House Referral:     Discharge planning Services  CM Consult  Post Acute Care Choice:    Choice offered to:     DME Arranged:    DME Agency:     HH Arranged:    HH Agency:     Status of Service:  Completed, signed off  If discussed at [employer] of Stay Meetings, dates discussed:    Additional Comments:  Dawayne Patricia, RN 07/09/2016, 11:16 AM

## 2016-07-09 NOTE — Evaluation (Signed)
Physical Therapy Evaluation Patient Details Name: Kenneth [name] MRN: [id number] DOB: 1955/11/09 Today's Date: 07/09/2016   History of Present Illness  Kenneth [name] is a 60 y.o. male with medical history significant of ESRD dialysis TTS, CHF with EF 25-30%, EtOH abuse last drink 2 days ago. He left dialysis center today after dialysis and was walking to bus stop when he became lightheaded and near-syncope episode.  Found to have low blood glucose and NSVT.  Clinical Impression  Patient with weakness, poor safety awareness and very high fall risk.  Will need 24 hour assist at home (which he reports he has,) and need follow up HHPT (but may not get if medicaid pending.)  Could even benefit from SNF, but observation status.  Will follow up if not d/c for continued acute level PT to address weakness and safety.     Follow Up Recommendations Home health PT;Supervision/Assistance - 24 hour    Equipment Recommendations  None recommended by PT    Recommendations for Other Services       Precautions / Restrictions Precautions Precautions: Fall      Mobility  Bed Mobility         Supine to sit: Modified independent (Device/Increase time)     General bed mobility comments: up in chair  Transfers Overall transfer level: Needs assistance Equipment used: Standard walker;None Transfers: Sit to/from Stand Sit to Stand: Supervision;Min guard         General transfer comment: cues when standing with walker to push up from chair, first time without device stood with S, but wide BOS and unsteady without device for balance  Ambulation/Gait Ambulation/Gait assistance: Mod assist Ambulation Distance (Feet): 40 Feet (x 2) Assistive device: Rolling walker (2 wheeled) Gait Pattern/deviations: Step-to pattern;Decreased stride length;Decreased dorsiflexion - left;Shuffle;Trunk flexed;Wide base of support     General Gait Details: somewhat ataxic pushing walker too far out, mod support for  safety, at times stopping walker and mod cues for pt to step inside walker, initially walked with short walker and pt flexed., second attempt with taller walker and pt still pushing too far out and demosntrating weakness and poor safety awareness, L foot dragging, cues for bringing it up further inside walker and to increase step length  Stairs            Wheelchair Mobility    Modified Rankin (Stroke Patients Only)       Balance Overall balance assessment: Needs assistance   Sitting balance-Leahy Scale: Fair     Standing balance support: Bilateral upper extremity supported Standing balance-Leahy Scale: Poor Standing balance comment: heavy UE support on walker; initially without walker in standing needed support and standing with very wide BOS                             Pertinent Vitals/Pain Pain Assessment: No/denies pain    Home Living Family/patient expects to be discharged to:: Private residence Living Arrangements: Children (daughter)   Type of Home: House Home Access: Stairs to enter Entrance Stairs-Rails: Right Entrance Stairs-Number of Steps: 5 Home Layout: One level Home Equipment: Environmental consultant - 2 wheels;Cane - single point      Prior Function Level of Independence: Independent with assistive device(s)         Comments: Not sure of accuracy of reported PLOF/history as no family members present during session. Pt reports independent. takes bus to dialysis, daughter doesn't work     Journalist, newspaper   Dominant  Hand: Right    Extremity/Trunk Assessment   Upper Extremity Assessment: Generalized weakness           Lower Extremity Assessment: Generalized weakness (able to lift extremities and accept resistance, but seems listless like extremities very heavy)         Communication   Communication: No difficulties  Cognition Arousal/Alertness: Awake/alert Behavior During Therapy: Flat affect Overall Cognitive Status: No family/caregiver  present to determine baseline cognitive functioning                      General Comments General comments (skin integrity, edema, etc.): Patient with very few verbalizations, encouraged him to speak up if he did not feel well; reports just tired and weak.  MD made aware    Exercises        Assessment/Plan    PT Assessment Patient needs continued PT services  PT Diagnosis Generalized weakness;Abnormality of gait   PT Problem List Decreased strength;Decreased mobility;Decreased safety awareness;Decreased coordination;Decreased activity tolerance;Decreased cognition;Decreased knowledge of use of DME  PT Treatment Interventions DME instruction;Gait training;Therapeutic activities;Therapeutic exercise;Patient/family education;Stair training;Balance training;Functional mobility training   PT Goals (Current goals can be found in the Care Plan section) Acute Rehab PT Goals Patient Stated Goal: none stated PT Goal Formulation: With patient Time For Goal Achievement: 07/16/16 Potential to Achieve Goals: Fair    Frequency Min 3X/week   Barriers to discharge        Co-evaluation               End of Session Equipment Utilized During Treatment: Gait belt Activity Tolerance: Patient limited by fatigue Patient left: in bed      Functional Assessment Tool Used: Clinical Judgement    Time: 2641-5830 PT Time Calculation (min) (ACUTE ONLY): 36 min   Charges:   PT Evaluation $PT Eval Moderate Complexity: 1 Procedure PT Treatments $Gait Training: 8-22 mins   PT G Codes:   PT G-Codes **NOT FOR INPATIENT CLASS** Functional Assessment Tool Used: Clinical Judgement    Kenneth [name] 07/09/2016, 11:04 AM  Magda Kiel, [place] 07/09/2016

## 2016-07-09 NOTE — Discharge Summary (Signed)
Physician Discharge Summary  Kenneth [name] DGL:[id number] DOB: Dec 27, 1955 DOA: 07/05/2016  PCP: No primary care provider on file.  Admit date: 07/05/2016 Discharge date: 07/09/2016  Admitted From: Home Disposition:  home  Recommendations for Outpatient Follow-up:  1. Follow-up with [facility] on Thursday, 07/10/2016 for regular hemodialysis. 2. Follow-up with PCP in 2 weeks.Patient will need to be referred for a thyroid ultrasound done for further evaluation of radioactivity noted in the neck region per stress Myoview. 3. Follow-up with Lesia Hausen, PA cardiology on 07/22/2016 at 10 AM for hospital follow-up. 4. Follow-up with cardiology on 08/19/2016 for repeat 2-D echo.    Discharge Condition: stable CODE STATUS: Full code.  Diet recommendation: Heart Healthy /  Brief/Interim Summary: #1 nonsustained V. tach Questionable etiology. Patient with 2-D echo done in July 2017 with a EF of 25-30% with diffuse hypokinesis,and moderate right ventricular systolic dysfunction. Patient was resumed on half home dose Coreg and dose further increased as patient had some more asymptomatic nonsustained V. tach. Patient was maintained on Coreg at 25 mg twice daily with no further episodes of nonsustained V. tach. Patient underwent a Myoview stress test which was negative for any ischemia. Telemetry post adjustment of Coreg just showed frequent PVCs, couplets, one triplet. It was felt per cardiology that patient was not a good candidate for an ICD implantation given his end-stage renal disease on hemodialysis. Patient will follow-up with cardiology and outpatient setting.   #2 near syncope May have been secondary to hypoglycemia as patient was noted to have CBGs in the 40s secondary to not eating. Patient with no further episodes. Due to nonsustained V. tach noted in the emergency room on admission patient was admitted to the hospital for further evaluation and cardiology consulted. Cardiology doubts if near  syncope is related to nonsustained V. tach. Patient underwent Myoview stress test on 07/07/2016 which was negative for ischemia with a EF of 30-44%. Radioactivity noted in the neck region. Patient was maintained on Coreg. Patient was placed on a diet did not have any further hypoglycemic spells and no further near-syncope spells. Patient had no focal neurological deficits. Outpatient follow-up. developed  hypotension 9-12, bidil discontinue.   Hypotension; developed hypotension 9-12, symptomatic. bidil stopped. SBP stable. asymptomatic now.   #3 end-stage renal disease on hemodialysis Tuesday Thursday Saturday Consulted with nephrology. Patient underwent hemodialysis on Tuesday, 07/08/2016. Patient will follow-up at his hemodialysis center at [facility] on Thursday, 07/10/2016.  #4 cardiomyopathy Felt likely to be nonischemic. Patient underwent a Myoview stress test which was negative for ischemia, with a EF of 30-44%. Patient was maintained on beta blocker and followed by cardiology during the hospitalization. It was felt per cardiology that no cardiac catheterization was indicated at this time. Patient will follow-up in the outpatient setting with cardiology with repeat echocardiogram in 6 weeks.  #5 hypoglycemia Secondary to not eating. Patient on admission was noted per EMS to have a CBG of 42. Patient was initially given glucose with improvement with his CBGs. Patient was admitted. Patient placed on a diet and CBGs remained stable. Patient had no further hypoglycemic episodes.  #6 hypertension Blood pressure borderline on admission. Patient's blood pressure improved. Patient was placed back on Coreg initiated half home dose and subsequently increased back to full dose of Coreg 25 mg twice daily which improved patient's nonsustained V. tach.  Patient became hypotensive, and symptomatic for that reason discharge was cancelled 9-12. bidil discontinue. BP stable. Patient stable to be discharge today.    #7 chronic combined systolic  and diastolic heart failure Stable. Patient with no signs of volume overload. Patient with end-stage renal disease on hemodialysis. Patient underwent regular hemodialysis on 07/08/2016. Outpatient follow-up.  #8 atrial flutter-- CHA2DS2VASc 4 Patient was maintained on Coreg for rate control. It was felt patient in the past was not a anticoagulation candidate. Outpatient follow-up with cardiology.   Discharge Diagnoses:  Principal Problem:   NSVT (nonsustained ventricular tachycardia) (HCC) Active Problems:   Systolic and diastolic CHF, chronic (HCC)   Essential hypertension   ESRD (end stage renal disease) ([place])   Alcohol abuse   Hypoglycemia   Near syncope   Cardiomyopathy [facility])    Discharge Instructions  Discharge Instructions    Diet - low sodium heart healthy    Complete by:  As directed    Renal diet.   Diet - low sodium heart healthy    Complete by:  As directed    Discharge instructions    Complete by:  As directed    Follow up at HD center on Thursday for regular dialysis. Cardiology office will call with outpatient appointment time. Stop drinking.   Increase activity slowly    Complete by:  As directed    Increase activity slowly    Complete by:  As directed        Medication List    STOP taking these medications   isosorbide-hydrALAZINE 20-37.5 MG tablet Commonly known as:  BIDIL     TAKE these medications   calcitRIOL 0.25 MCG capsule Commonly known as:  ROCALTROL Take 3 capsules (0.75 mcg total) by mouth Every Tuesday,Thursday,and Saturday with dialysis.   calcium acetate 667 MG capsule Commonly known as:  PHOSLO Take 2 capsules (1,334 mg total) by mouth 3 (three) times daily with meals.   carvedilol 12.5 MG tablet Commonly known as:  COREG Take 1 tablet (12.5 mg total) by mouth 2 (two) times daily with a meal. What changed:  medication strength  how much to take   feeding supplement (NEPRO CARB STEADY)  Liqd Take 237 mLs by mouth 2 (two) times daily between meals.   folic acid 1 MG tablet Commonly known as:  FOLVITE Take 1 tablet (1 mg total) by mouth daily.   ipratropium-albuterol 0.5-2.5 (3) MG/3ML Soln Commonly known as:  DUONEB Take 3 mLs by nebulization every 4 (four) hours as needed.   multivitamin with minerals Tabs tablet Take 1 tablet by mouth daily.   thiamine 100 MG tablet Take 1 tablet (100 mg total) by mouth daily.      Follow-up Information    Lyda Jester, PA-C Follow up on 07/22/2016.   Specialties:  Cardiology, Radiology Why:  See provider at 10:00 am, please arrive 15 minutes early for paperwork. Contact information: [address] [place] [address] [facility] Follow up on 08/19/2016.   Why:  Echocardiogram at 10:30 am, please arrive 15 minutes early for paperwork. Contact information: [place] [id number] [facility] Follow up on 07/10/2016.   Why:  F/U FOR HD Contact information: [address] [place] [address] [phone]        PCP. Schedule an appointment as soon as possible for a visit in 2 week(s).   Why:  F/U WITH PCP IN 1-2 WEEKS.         No Known Allergies  Consultations:  Cardiology  Nephrology  Procedures/Studies: Nm Myocar Multi W/spect W/wall Motion / Ef  Result Date: 07/07/2016  There was no ST segment deviation noted during stress.  Defect 1: There is a medium defect of moderate severity present in the basal inferior, mid inferior and apical inferior location.  Findings consistent with prior myocardial infarction and possible soft tissue attenuation. No significant ischemia  This is an intermediate risk study.  The left ventricular ejection fraction is moderately decreased (30-44%).  Radioactivity activity noted in neck region on raw images.  Recommend thyroid ultrasound to define    Dg Chest Port 1 View  Result Date: 07/05/2016 CLINICAL DATA:  Dizziness.  Dialysis today. EXAM: PORTABLE CHEST 1 VIEW COMPARISON:  May 15, 2016 FINDINGS: A double lumen dialysis catheter is identified on the right in good position. The heart, hila, mediastinum, lungs, and pleura are normal. IMPRESSION: No active disease. Electronically Signed   By: Dorise Bullion III M.D   On: 07/05/2016 19:06      Subjective: Feeling better, no weakness.   Discharge Exam: Vitals:   07/08/16 2239 07/09/16 0540  BP: 137/80 (!) 100/56  Pulse: 73 71  Resp:  18  Temp:  98.5 F (36.9 C)   Vitals:   07/08/16 1804 07/08/16 2026 07/08/16 2239 07/09/16 0540  BP: (!) 105/53 (!) 95/54 137/80 (!) 100/56  Pulse: 77 65 73 71  Resp:  18  18  Temp:  98.7 F (37.1 C)  98.5 F (36.9 C)  TempSrc:  Oral  Oral  SpO2: 100% 98%  95%  Weight:    76.9 kg (169 lb 8.5 oz)  Height:        General: Pt is alert, awake, not in acute distress Cardiovascular: RRR, S1/S2 +, no rubs, no gallops Respiratory: CTA bilaterally, no wheezing, no rhonchi Abdominal: Soft, NT, ND, bowel sounds + Extremities: no edema, no cyanosis    The results of significant diagnostics from this hospitalization (including imaging, microbiology, ancillary and laboratory) are listed below for reference.     Microbiology: Recent Results (from the past 240 hour(s))  MRSA PCR Screening     Status: None   Collection Time: 07/06/16  6:00 AM  Result Value Ref Range Status   MRSA by PCR NEGATIVE NEGATIVE Final    Comment:        The GeneXpert MRSA Assay (FDA approved for NASAL specimens only), is one component of a comprehensive MRSA colonization surveillance program. It is not intended to diagnose MRSA infection nor to guide or monitor treatment for MRSA infections.      Labs: BNP (last 3 results)  Recent Labs  05/12/16 0736  BNP 9,678.9*   Basic Metabolic Panel:  Recent Labs Lab  07/05/16 1810 07/07/16 0217 07/08/16 0500 07/09/16 0301  NA 136 136 135 132*  K 4.4 4.2 4.4 4.2  CL 96* 95* 96* 95*  CO2 27 24 23 25   GLUCOSE 74 79 91 79  BUN 23* 47* 66* 37*  CREATININE 7.08* 11.38* 13.80* 9.65*  CALCIUM 8.6* 8.7* 8.6* 9.1  MG  --  2.1  --   --   PHOS  --   --  8.3* 5.8*   Liver Function Tests:  Recent Labs Lab 07/05/16 1810 07/08/16 0500 07/09/16 0301  AST 30  --   --   ALT 12*  --   --   ALKPHOS 64  --   --   BILITOT 1.4*  --   --   PROT 9.2*  --   --  ALBUMIN 3.3* 2.9* 3.0*   No results for input(s): LIPASE, AMYLASE in the last 168 hours. No results for input(s): AMMONIA in the last 168 hours. CBC:  Recent Labs Lab 07/05/16 1810 07/08/16 0500 07/09/16 0301  WBC 5.1 5.2 5.6  HGB 14.8 11.4* 12.1*  HCT 43.3 34.2* 36.5*  MCV 92.7 91.0 91.3  PLT 246 229 235   Cardiac Enzymes: No results for input(s): CKTOTAL, CKMB, CKMBINDEX, TROPONINI in the last 168 hours. BNP: Invalid input(s): POCBNP CBG:  Recent Labs Lab 07/08/16 0613 07/08/16 1140 07/08/16 1746 07/08/16 2032 07/09/16 0615  GLUCAP 88 82 82 120* 91   D-Dimer No results for input(s): DDIMER in the last 72 hours. Hgb A1c No results for input(s): HGBA1C in the last 72 hours. Lipid Profile No results for input(s): CHOL, HDL, LDLCALC, TRIG, CHOLHDL, LDLDIRECT in the last 72 hours. Thyroid function studies No results for input(s): TSH, T4TOTAL, T3FREE, THYROIDAB in the last 72 hours.  Invalid input(s): FREET3 Anemia work up No results for input(s): VITAMINB12, FOLATE, FERRITIN, TIBC, IRON, RETICCTPCT in the last 72 hours. Urinalysis    Component Value Date/Time   COLORURINE YELLOW 05/12/2016 0834   APPEARANCEUR CLOUDY (A) 05/12/2016 0834   LABSPEC 1.014 05/12/2016 0834   PHURINE 5.0 05/12/2016 0834   GLUCOSEU NEGATIVE 05/12/2016 0834   HGBUR MODERATE (A) 05/12/2016 0834   BILIRUBINUR NEGATIVE 05/12/2016 0834   KETONESUR NEGATIVE 05/12/2016 0834   PROTEINUR 100 (A)  05/12/2016 0834   UROBILINOGEN 0.2 12/09/2013 0013   NITRITE NEGATIVE 05/12/2016 0834   LEUKOCYTESUR NEGATIVE 05/12/2016 0834   Sepsis Labs Invalid input(s): PROCALCITONIN,  WBC,  LACTICIDVEN Microbiology Recent Results (from the past 240 hour(s))  MRSA PCR Screening     Status: None   Collection Time: 07/06/16  6:00 AM  Result Value Ref Range Status   MRSA by PCR NEGATIVE NEGATIVE Final    Comment:        The GeneXpert MRSA Assay (FDA approved for NASAL specimens only), is one component of a comprehensive MRSA colonization surveillance program. It is not intended to diagnose MRSA infection nor to guide or monitor treatment for MRSA infections.      Time coordinating discharge: Over 30 minutes  SIGNED:   Elmarie Shiley, MD  [facility] 07/09/2016, 11:03 AM Pager   If 7PM-7AM, please contact night-coverage [email or web address] Password [email or web address]

## 2016-07-09 NOTE — Progress Notes (Signed)
*  PRELIMINARY RESULTS* Vascular Ultrasound Carotid Duplex (Doppler) has been completed.  Study was technically difficult and limited due to patient anatomy and high bifurcation. Findings suggest 1-39% internal carotid artery stenosis bilaterally. The left vertebral artery is patent with antegrade flow. Unable to visualize the right vertebral artery.  07/09/2016 12:06 PM Maudry Mayhew, BS, RVT, RDCS, RDMS

## 2016-07-09 NOTE — Progress Notes (Signed)
Order received to discharge patient.  Patient expresses readiness to discharge.  Discharge instructions, follow up, medications and instructions for their use were discussed with patient and patient voiced understanding.  Telemetry monitor was removed and CCMD notified.

## 2016-07-09 NOTE — Progress Notes (Signed)
Subjective:  Did not go home due to low BP's on full dose hydralazine and coreg- HD yest with 1700 removed.  Feels better this AM he says but is a little flat Objective Vital signs in last 24 hours: Vitals:   07/08/16 1804 07/08/16 2026 07/08/16 2239 07/09/16 0540  BP: (!) 105/53 (!) 95/54 137/80 (!) 100/56  Pulse: 77 65 73 71  Resp:  18  18  Temp:  98.7 F (37.1 C)  98.5 F (36.9 C)  TempSrc:  Oral  Oral  SpO2: 100% 98%  95%  Weight:    76.9 kg (169 lb 8.5 oz)  Height:       Weight change:   Intake/Output Summary (Last 24 hours) at 07/09/16 0850 Last data filed at 07/08/16 1700  Gross per 24 hour  Intake              720 ml  Output             1700 ml  Net             -980 ml    Dialysis Orders: [facility] TTS 4.25 hours 400/800 2 K 2 Ca heparin 2400 EDW 77.5 - gets +/- calcitriol 0.75 no Mircera, venofer 50 / week s/p full course net UF goals generally 2- 3 kg Recent labs:  hgb 12.7 17% sat iPTH 511 Ca/P within goal - no med list in [place] NEEDS MED in [place] after discharge  Assessment/Plan: 1. NSVT -prev hx afib- s/p lexiscan - intermediate risk- trop 0.03- cards involved- sounds like conservative management  2. ESRD -  TTS - K 4.2 HD next due tomorrow via PC ;  AVF maturing 3. Hypertension/volume  - controlled (maybe overcontrolled)  with HD/current meds- did not seem volume related his episode post HD.  I have decreased his coreg and bidil to 12.5 BID and BID with bidil- will not take these meds pre HD 4. Anemia  -  hgb 14.8--11.4 - hold Fe for now - resume after d/c 5. Metabolic bone disease -  Continue calcitriol - not on binders yet- phos 8.4 so will start phoslo  6. Nutrition - renal diet + vitamin - added nepro 7. Alcohol abuse - on thiamine 8. Hypoglycemic episode - hadn't eaten prior to this; BS 70 s in hospital/vits 9. Dispo- hopefully to go home today- if not will do HD here in AM     Malena Timpone A    Labs: Basic Metabolic Panel:  Recent Labs Lab  07/07/16 0217 07/08/16 0500 07/09/16 0301  NA 136 135 132*  K 4.2 4.4 4.2  CL 95* 96* 95*  CO2 24 23 25   GLUCOSE 79 91 79  BUN 47* 66* 37*  CREATININE 11.38* 13.80* 9.65*  CALCIUM 8.7* 8.6* 9.1  PHOS  --  8.3* 5.8*   Liver Function Tests:  Recent Labs Lab 07/05/16 1810 07/08/16 0500 07/09/16 0301  AST 30  --   --   ALT 12*  --   --   ALKPHOS 64  --   --   BILITOT 1.4*  --   --   PROT 9.2*  --   --   ALBUMIN 3.3* 2.9* 3.0*   No results for input(s): LIPASE, AMYLASE in the last 168 hours. No results for input(s): AMMONIA in the last 168 hours. CBC:  Recent Labs Lab 07/05/16 1810 07/08/16 0500 07/09/16 0301  WBC 5.1 5.2 5.6  HGB 14.8 11.4* 12.1*  HCT 43.3 34.2* 36.5*  MCV 92.7  91.0 91.3  PLT 246 229 235   Cardiac Enzymes: No results for input(s): CKTOTAL, CKMB, CKMBINDEX, TROPONINI in the last 168 hours. CBG:  Recent Labs Lab 07/08/16 0613 07/08/16 1140 07/08/16 1746 07/08/16 2032 07/09/16 0615  GLUCAP 88 82 82 120* 91    Iron Studies: No results for input(s): IRON, TIBC, TRANSFERRIN, FERRITIN in the last 72 hours. Studies/Results: Nm Myocar Multi W/spect W/wall Motion / Ef  Result Date: 07/07/2016  There was no ST segment deviation noted during stress.  Defect 1: There is a medium defect of moderate severity present in the basal inferior, mid inferior and apical inferior location.  Findings consistent with prior myocardial infarction and possible soft tissue attenuation. No significant ischemia  This is an intermediate risk study.  The left ventricular ejection fraction is moderately decreased (30-44%).  Radioactivity activity noted in neck region on raw images. Recommend thyroid ultrasound to define    Medications: Infusions:    Scheduled Medications: . calcitRIOL  0.75 mcg Oral Q T,Th,Sa-HD  . calcium acetate  1,334 mg Oral TID WC  . carvedilol  12.5 mg Oral BID WC  . feeding supplement (NEPRO CARB STEADY)  237 mL Oral BID BM  . folic acid   1 mg Oral Daily  . heparin  5,000 Units Subcutaneous Q8H  . isosorbide-hydrALAZINE  1 tablet Oral BID  . multivitamin with minerals  1 tablet Oral Daily  . sodium chloride flush  3 mL Intravenous Q12H  . thiamine  100 mg Oral Daily    have reviewed scheduled and prn medications.  Physical Exam: General: seems withdrawn- thinks he may go home today  Heart: RRR Lungs: mostly clear Abdomen: soft, non tender Extremities: no edema Dialysis Access: PC and AVF placed 7/19   07/09/2016,8:50 AM  LOS: 0 days

## 2016-07-15 DIAGNOSIS — E46 Unspecified protein-calorie malnutrition: Secondary | ICD-10-CM | POA: Insufficient documentation

## 2016-07-15 NOTE — Progress Notes (Signed)
PT G-Code Note    07/09/16 1106  PT G-Codes **NOT FOR INPATIENT CLASS**  Functional Assessment Tool Used Clinical Judgement  Functional Limitation Mobility: Walking and moving around  Mobility: Walking and Moving Around Current Status (C5852) CJ  Mobility: Walking and Moving Around Goal Status [phone]) CJ  Mobility: Walking and Moving Around Discharge Status [phone]) Levada Dy [place], [place] 07/15/2016

## 2016-07-22 ENCOUNTER — Encounter: Payer: Self-pay | Admitting: Physician Assistant

## 2016-07-22 ENCOUNTER — Ambulatory Visit (INDEPENDENT_AMBULATORY_CARE_PROVIDER_SITE_OTHER): Payer: Self-pay | Admitting: Physician Assistant

## 2016-07-22 VITALS — BP 166/91 | HR 74 | Wt 181.0 lb

## 2016-07-22 DIAGNOSIS — I4729 Other ventricular tachycardia: Secondary | ICD-10-CM

## 2016-07-22 DIAGNOSIS — I10 Essential (primary) hypertension: Secondary | ICD-10-CM

## 2016-07-22 DIAGNOSIS — I472 Ventricular tachycardia: Secondary | ICD-10-CM

## 2016-07-22 DIAGNOSIS — I5042 Chronic combined systolic (congestive) and diastolic (congestive) heart failure: Secondary | ICD-10-CM

## 2016-07-22 MED ORDER — CARVEDILOL 12.5 MG PO TABS: ORAL_TABLET | ORAL | 3 refills | Status: DC

## 2016-07-22 MED ORDER — CARVEDILOL 25 MG PO TABS: ORAL_TABLET | ORAL | 3 refills | Status: DC

## 2016-07-22 NOTE — Progress Notes (Signed)
Cardiology Office Note   Date:  07/22/2016   ID:  Kenneth [name], DOB October 22, 1956, MRN [id number]  PCP:  No PCP Per Patient  Cardiologist:  Dr [name]  Miraya Cudney, PA-C   Chief Complaint  Patient presents with  . Follow-up    post hospital     History of Present Illness: Kenneth [name] is a 60 y.o. male with a history of S-D-CHF, NSVT, ESRD on HD, HTN, ETOH, NICM w/ EF 25-30% by echo 04/2016  D/c 09/12 after admit for hypoglycemia, vol mgt w/ HD, cards saw for NSVT>>BB increased, MV w/ scar but no ischemia and EF 30-44%, atrial flutter seen,CHA2DS2VASc-4, not anticoag candidate, repeat echo in 6 weeks.  Kenneth [name] presents for Post hospital follow-up  Since discharge from the hospital, Kenneth [name] has been compliant with hemodialysis appointments. He has a family member here with him today who states that she manages his medications and make sure he gets to dialysis.  He sleeps a lot of the day. He does not get up much in the morning, she wakes him at 11:15 so he can get to dialysis by noon. He frequently does not eat before dialysis, but she is making sure he has a sandwich to take with him so he can get something to eat at dialysis. I advised that was very important to make sure that he does not get hypoglycemic after dialysis again.  On nondialysis days, he gets up sometimes in the morning and will eat a little something then go back to bed. Prior to his last admission, he was only taking the carvedilol 25 mg once a day. He has not had his carvedilol this a.m. because he has not eaten and she is careful to give it to him only with food twice a day.  There was some confusion on his discharge medications. Because of the nonsustained VT, we wanted him to be on atenolol 25 mg twice a day. However, on his discharge summary, he was placed on carvedilol 12.5 mg twice a day. He has been compliant with this. He has had no palpitations. He has not been lightheaded or dizzy. He does not have  a blood pressure cuff at home, and does not remember what his blood pressures have been at dialysis. He does not remember being told that his heart rate was either high or low.  Because of financial issues, they have not gotten the calcitriol filled. However, she will have money for that soon and will put him on it as soon as she can. She does have the carvedilol tablets and he is taking that. She is not aware that he is supposed to be on any other medications, but other meds were listed on his discharge summary including PhosLo, Folvite, thiamine and NePro.   Past Medical History:  Diagnosis Date  . Cardiomyopathy secondary    likely related to HTN heart disease; possibly ETOH related as well  . Chronic combined systolic and diastolic heart failure (HCC)    Echocardiogram 09/22/11: Moderate LVH, EF 63-84%, grade 3 diastolic dysfunction, mild MR, moderate to severe LAE, mild RVE, mild to moderate TR, small to moderate pericardial effusion  . CKD (chronic kidney disease) stage 4, GFR 15-29 ml/min (HCC)    renal failure; due to hypertensive nephrosclerosis  . History of alcohol abuse   . Hypertension     Past Surgical History:  Procedure Laterality Date  . AV FISTULA PLACEMENT Left 05/14/2016   Procedure: LEFT ARM BASILIC VEIN TRANSPOSITION;  Surgeon: Rosetta Posner, MD;  Location: [facility];  Service: Vascular;  Laterality: Left;  . PERIPHERAL VASCULAR CATHETERIZATION N/A 05/13/2016   Procedure: Dialysis/Perma Catheter Insertion;  Surgeon: Serafina Mitchell, MD;  Location: [facility];  Service: Cardiovascular;  Laterality: N/A;    Current Outpatient Prescriptions  Medication Sig Dispense Refill  . carvedilol (COREG) 12.5 MG tablet Take 1 tablet (12.5 mg total) by mouth 2 (two) times daily with a meal. 60 tablet 0   No current facility-administered medications for this visit.     Allergies:   Review of patient's allergies indicates no known allergies.    Social History:  The patient   reports that he has quit smoking. He uses smokeless tobacco. He reports that he drinks about 1.8 oz of alcohol per week . He reports that he does not use drugs.   Family History:  The patient's family history includes Cirrhosis in his father; Emphysema in his mother.    ROS:  Please see the history of present illness. All other systems are reviewed and negative.    PHYSICAL EXAM: VS:  BP (!) 166/91 (BP Location: Right Arm)   Pulse 74   Wt 181 lb (82.1 kg)   SpO2 97%   BMI 25.97 kg/m  , BMI Body mass index is 25.97 kg/m. GEN: Well nourished, well developed, male in no acute distress  HEENT: normal for age  Neck: no JVD, no carotid bruit, no masses Cardiac: RRR; 2/6 murmur, no rubs, or gallops Respiratory:  Decreased breath sounds bases bilaterally, normal work of breathing GI: soft, nontender, nondistended, + BS MS: no deformity or atrophy; no edema; distal pulses are present in all 4 extremities; dialysis graft is noted in left upper arm with a thrill. He is currently getting dialysis through a catheter in his right subclavian   Skin: warm and dry, no rash Neuro:  Strength and sensation are intact Psych: euthymic mood, full affect   EKG:  EKG is ordered today. The ekg ordered today demonstrates sinus rhythm, rate 74, no acute ischemic changes   Recent Labs: 05/12/2016: B Natriuretic Peptide 2,191.8 05/13/2016: TSH 4.868 07/05/2016: ALT 12 07/07/2016: Magnesium 2.1 07/09/2016: BUN 37; Creatinine, Ser 9.65; Hemoglobin 12.1; Platelets 235; Potassium 4.2; Sodium 132    Lipid Panel No results found for: CHOL, TRIG, HDL, CHOLHDL, VLDL, LDLCALC, LDLDIRECT   Wt Readings from Last 3 Encounters:  07/22/16 181 lb (82.1 kg)  07/09/16 169 lb 8.5 oz (76.9 kg)  05/22/16 180 lb 1.9 oz (81.7 kg)     Other studies Reviewed: Additional studies/ records that were reviewed today include: Hospital records and testing.  ASSESSMENT AND PLAN:  1. Nonsustained VT: He was asymptomatic with this  in the hospital. However, with his decreased EF, it was felt this should be minimized by uptitrating his beta blocker as his blood pressure would tolerate. However, because of dialysis, he needs adjustment in his beta blocker dosage so he can do well at dialysis.  Therefore, we will increase his carvedilol to 25 mg twice a day on nondialysis days. On nondialysis days, he will take 12.5 mg daily. We will call the dialysis center on [facility] to determine what his blood pressure is at the end dialysis, to see if he can tolerate carvedilol prior to dialysis.  2. Chronic combined systolic and diastolic CHF: Volume management is with dialysis now. His volume status is good.  3. End-stage renal disease on hemodialysis: He is not currently taking calcitriol or any  of his other renal medications. He is to let the people at the dialysis center note this so they can manage him appropriately.  4. Atrial flutter: He is not having any palpitations, presyncope or other symptoms from any arrhythmia. Continue to follow on beta blocker as tolerated. He is not a chronic anticoagulation candidate.   Current medicines are reviewed at length with the patient today.  The patient does not have concerns regarding medicines.  The following changes have been made:  See carvedilol adjustments above  Labs/ tests ordered today include:  No orders of the defined types were placed in this encounter.    Disposition:   FU with Dr. [name]  Signed, Lenoard Aden  07/22/2016 10:52 AM    [place] Phone: [phone]; Fax: [phone]  This note was written with the assistance of speech recognition software. Please excuse any transcriptional errors.

## 2016-07-22 NOTE — Patient Instructions (Signed)
Medications:  Take Carvedilol 25 mg twice daily on non dialysis days.  Take Carvedilol 12.5 mg twice daily on dialysis days.   --We will call the dialysis center at the end of the day to see what your BP is after dialysis.   Follow-Up:  Your physician recommends that you schedule a follow-up appointment in: 3 months with Dr. [name].  If you need a refill on your cardiac medications before your next appointment, please call your pharmacy.

## 2016-07-24 ENCOUNTER — Telehealth: Payer: Self-pay | Admitting: Cardiology

## 2016-07-24 NOTE — Telephone Encounter (Signed)
Returned call and got patient's daugher, ok per DPR. She wanted to make sure her father's orders for carvedilol had not changed since her left from his appt on 07/22/16.  I reviewed the chart and did not see any note or orders changing his medication. She verbalized understanding and will follow instructions from recent AVS.

## 2016-07-24 NOTE — Telephone Encounter (Signed)
New message ° ° ° ° ° ° °Pt returning nurse call  °

## 2016-07-29 ENCOUNTER — Encounter: Payer: Self-pay | Admitting: Vascular Surgery

## 2016-07-29 ENCOUNTER — Ambulatory Visit: Payer: Self-pay | Admitting: Cardiology

## 2016-08-03 NOTE — Progress Notes (Deleted)
    Postoperative Access Visit   History of Present Illness  Kenneth [name] is a 60 y.o. year old male who presents for postoperative follow-up for: L BVT by Dr. Donnetta Hutching (Date: 05/14/16).  The patient's wounds are *** healed.  The patient notes *** steal symptoms.  The patient is *** able to complete their activities of daily living.  The patient's current symptoms are: ***.  For VQI Use Only  PRE-ADM LIVING: {VQI Pre-admission Living:20973}  AMB STATUS: {VQI Ambulatory Status:20974}  Physical Examination There were no vitals filed for this visit.  ***E: Incision is *** healed, skin feels ***, hand grip is ***/5, sensation in digits is *** intact, ***palpable thrill, bruit can *** be auscultated   Medical Decision Making  Kenneth [name] is a 60 y.o. year old male who presents s/p L BVT by Dr. Donnetta Hutching.   The patient's access is *** ready for use.  The patient's tunneled dialysis catheter can be removed after two successful cannulations and completed dialysis treatments.  Thank you for allowing [place] to participate in this patient's care.   Adele Barthel, MD, FACS [facility] Office: [phone] Pager: [phone]

## 2016-08-04 ENCOUNTER — Ambulatory Visit: Payer: Self-pay | Admitting: Vascular Surgery

## 2016-08-14 ENCOUNTER — Encounter: Payer: Self-pay | Admitting: Vascular Surgery

## 2016-08-15 ENCOUNTER — Ambulatory Visit (INDEPENDENT_AMBULATORY_CARE_PROVIDER_SITE_OTHER): Payer: Self-pay | Admitting: Vascular Surgery

## 2016-08-15 ENCOUNTER — Encounter: Payer: Self-pay | Admitting: Vascular Surgery

## 2016-08-15 VITALS — BP 125/88 | HR 89 | Temp 98.5°F | Resp 16 | Ht 70.0 in | Wt 175.0 lb

## 2016-08-15 DIAGNOSIS — N186 End stage renal disease: Secondary | ICD-10-CM

## 2016-08-15 NOTE — Progress Notes (Signed)
    Postoperative Access Visit   History of Present Illness  Kenneth [name] is a 60 y.o. year old male who presents for postoperative follow-up for: L BVT performed at another practice < 3 month ago.  Pt was sent her for evaluation of maturation.   Physical Examination Vitals:   08/15/16 1534  BP: 125/88  Pulse: 89  Resp: 16  Temp: 98.5 F (36.9 C)    LUE: Incisions are healed, skin feels warm, hand grip is 5/5, sensation in digits is intact, palpable thrill, bruit can be auscultated, On Sonosite, easily visible fistula is >6 mm throughout  Medical Decision Making  Kenneth [name] is a 60 y.o. year old male who presents s/p L BC AVF.   The patient's access is ready for use.  The patient's tunneled dialysis catheter can be removed after two successful cannulations and completed dialysis treatments.  Adele Barthel, MD, FACS [facility] Office: [phone] Pager: [phone]

## 2016-08-19 ENCOUNTER — Other Ambulatory Visit (HOSPITAL_COMMUNITY): Payer: Self-pay

## 2016-10-07 ENCOUNTER — Encounter (HOSPITAL_COMMUNITY): Payer: Self-pay | Admitting: Emergency Medicine

## 2016-10-07 ENCOUNTER — Emergency Department (HOSPITAL_COMMUNITY): Payer: Medicare Other

## 2016-10-07 ENCOUNTER — Inpatient Hospital Stay (HOSPITAL_COMMUNITY)
Admission: EM | Admit: 2016-10-07 | Discharge: 2016-10-11 | DRG: 312 | Disposition: A | Discharge status: Home / Self Care | Payer: Medicare Other | Attending: Internal Medicine | Admitting: Internal Medicine

## 2016-10-07 ENCOUNTER — Emergency Department (HOSPITAL_COMMUNITY)
Admission: EM | Admit: 2016-10-07 | Discharge: 2016-10-07 | Disposition: A | Discharge status: Home / Self Care | Payer: Medicare Other | Source: Home / Self Care

## 2016-10-07 DIAGNOSIS — Z5321 Procedure and treatment not carried out due to patient leaving prior to being seen by health care provider: Secondary | ICD-10-CM

## 2016-10-07 DIAGNOSIS — Y999 Unspecified external cause status: Secondary | ICD-10-CM

## 2016-10-07 DIAGNOSIS — N184 Chronic kidney disease, stage 4 (severe): Secondary | ICD-10-CM

## 2016-10-07 DIAGNOSIS — Z79899 Other long term (current) drug therapy: Secondary | ICD-10-CM

## 2016-10-07 DIAGNOSIS — D649 Anemia, unspecified: Secondary | ICD-10-CM | POA: Diagnosis present

## 2016-10-07 DIAGNOSIS — I428 Other cardiomyopathies: Secondary | ICD-10-CM | POA: Diagnosis present

## 2016-10-07 DIAGNOSIS — R55 Syncope and collapse: Secondary | ICD-10-CM | POA: Diagnosis not present

## 2016-10-07 DIAGNOSIS — T68XXXA Hypothermia, initial encounter: Secondary | ICD-10-CM

## 2016-10-07 DIAGNOSIS — Z992 Dependence on renal dialysis: Secondary | ICD-10-CM

## 2016-10-07 DIAGNOSIS — Z87891 Personal history of nicotine dependence: Secondary | ICD-10-CM | POA: Insufficient documentation

## 2016-10-07 DIAGNOSIS — I10 Essential (primary) hypertension: Secondary | ICD-10-CM | POA: Diagnosis present

## 2016-10-07 DIAGNOSIS — Y939 Activity, unspecified: Secondary | ICD-10-CM | POA: Insufficient documentation

## 2016-10-07 DIAGNOSIS — I130 Hypertensive heart and chronic kidney disease with heart failure and stage 1 through stage 4 chronic kidney disease, or unspecified chronic kidney disease: Secondary | ICD-10-CM | POA: Insufficient documentation

## 2016-10-07 DIAGNOSIS — I5042 Chronic combined systolic (congestive) and diastolic (congestive) heart failure: Secondary | ICD-10-CM

## 2016-10-07 DIAGNOSIS — I951 Orthostatic hypotension: Principal | ICD-10-CM | POA: Diagnosis present

## 2016-10-07 DIAGNOSIS — W19XXXA Unspecified fall, initial encounter: Secondary | ICD-10-CM | POA: Insufficient documentation

## 2016-10-07 DIAGNOSIS — Y929 Unspecified place or not applicable: Secondary | ICD-10-CM | POA: Insufficient documentation

## 2016-10-07 DIAGNOSIS — I4581 Long QT syndrome: Secondary | ICD-10-CM | POA: Diagnosis present

## 2016-10-07 DIAGNOSIS — N186 End stage renal disease: Secondary | ICD-10-CM

## 2016-10-07 DIAGNOSIS — Z043 Encounter for examination and observation following other accident: Secondary | ICD-10-CM

## 2016-10-07 DIAGNOSIS — I132 Hypertensive heart and chronic kidney disease with heart failure and with stage 5 chronic kidney disease, or end stage renal disease: Secondary | ICD-10-CM | POA: Diagnosis present

## 2016-10-07 DIAGNOSIS — M898X9 Other specified disorders of bone, unspecified site: Secondary | ICD-10-CM | POA: Diagnosis present

## 2016-10-07 DIAGNOSIS — I480 Paroxysmal atrial fibrillation: Secondary | ICD-10-CM | POA: Diagnosis present

## 2016-10-07 DIAGNOSIS — R2689 Other abnormalities of gait and mobility: Secondary | ICD-10-CM

## 2016-10-07 DIAGNOSIS — X31XXXA Exposure to excessive natural cold, initial encounter: Secondary | ICD-10-CM

## 2016-10-07 LAB — COMPREHENSIVE METABOLIC PANEL
ALBUMIN: 4.1 g/dL (ref 3.5–5.0)
ALK PHOS: 71 U/L (ref 38–126)
ALT: 12 U/L — AB (ref 17–63)
AST: 21 U/L (ref 15–41)
Anion gap: 15 (ref 5–15)
BILIRUBIN TOTAL: 1 mg/dL (ref 0.3–1.2)
BUN: 32 mg/dL — AB (ref 6–20)
CALCIUM: 9.2 mg/dL (ref 8.9–10.3)
CO2: 26 mmol/L (ref 22–32)
CREATININE: 8.78 mg/dL — AB (ref 0.61–1.24)
Chloride: 92 mmol/L — ABNORMAL LOW (ref 101–111)
GFR calc Af Amer: 7 mL/min — ABNORMAL LOW (ref >60)
GFR calc non Af Amer: 6 mL/min — ABNORMAL LOW (ref >60)
GLUCOSE: 113 mg/dL — AB (ref 65–99)
Potassium: 5.8 mmol/L — ABNORMAL HIGH (ref 3.5–5.1)
SODIUM: 133 mmol/L — AB (ref 135–145)
Total Protein: 10.4 g/dL — ABNORMAL HIGH (ref 6.5–8.1)

## 2016-10-07 LAB — CBC WITH DIFFERENTIAL/PLATELET
BASOS ABS: 0 10*3/uL (ref 0.0–0.1)
BASOS PCT: 0 %
Eosinophils Absolute: 0.2 10*3/uL (ref 0.0–0.7)
Eosinophils Relative: 2 %
HEMATOCRIT: 42.2 % (ref 39.0–52.0)
HEMOGLOBIN: 14.5 g/dL (ref 13.0–17.0)
LYMPHS PCT: 12 %
Lymphs Abs: 1.3 10*3/uL (ref 0.7–4.0)
MCH: 32.8 pg (ref 26.0–34.0)
MCHC: 34.4 g/dL (ref 30.0–36.0)
MCV: 95.5 fL (ref 78.0–100.0)
Monocytes Absolute: 0.8 10*3/uL (ref 0.1–1.0)
Monocytes Relative: 8 %
NEUTROS ABS: 8.4 10*3/uL — AB (ref 1.7–7.7)
NEUTROS PCT: 78 %
Platelets: 245 10*3/uL (ref 150–400)
RBC: 4.42 MIL/uL (ref 4.22–5.81)
RDW: 16 % — ABNORMAL HIGH (ref 11.5–15.5)
WBC: 10.8 10*3/uL — ABNORMAL HIGH (ref 4.0–10.5)

## 2016-10-07 LAB — PROTIME-INR
INR: 1.01
Prothrombin Time: 13.3 seconds (ref 11.4–15.2)

## 2016-10-07 LAB — ETHANOL: Alcohol, Ethyl (B): <5 mg/dL (ref <5)

## 2016-10-07 LAB — AMMONIA: Ammonia: 23 umol/L (ref 9–35)

## 2016-10-07 MED ORDER — SODIUM CHLORIDE 0.9 % IV SOLN
INTRAVENOUS | Status: DC
  Administered 2016-10-07: 22:00:00 via INTRAVENOUS

## 2016-10-07 NOTE — ED Notes (Signed)
[phone] ( daughters phone number) Daughter called, has sent a taxi to pick up her father.

## 2016-10-07 NOTE — ED Notes (Signed)
Nurse will draw labs. 

## 2016-10-07 NOTE — ED Provider Notes (Signed)
[facility] DEPT Provider Note   CSN: [id number] Arrival date & time: 10/07/16  2027     History   Chief Complaint Chief Complaint  Patient presents with  . Cold Exposure    HPI Azeem Poorman is a 60 y.o. male.  Pt presents to the ED today after being found on the side of the road on [address].  It is 33 degrees outside currently.  Patient has no idea how long he was outside or what happened.  The pt was here earlier today after a fall in dialysis.  He was triaged at 1657 and when his name was called at 1916, he was not in the ED.  The pt denies any pain.  He did complete his dialysis today.      Past Medical History:  Diagnosis Date  . Cardiomyopathy secondary    likely related to HTN heart disease; possibly ETOH related as well  . Chronic combined systolic and diastolic heart failure (HCC)    Echocardiogram 09/22/11: Moderate LVH, EF 32-44%, grade 3 diastolic dysfunction, mild MR, moderate to severe LAE, mild RVE, mild to moderate TR, small to moderate pericardial effusion  . CKD (chronic kidney disease) stage 4, GFR 15-29 ml/min (HCC)    renal failure; due to hypertensive nephrosclerosis  . History of alcohol abuse   . Hypertension     Patient Active Problem List   Diagnosis Date Noted  . Syncope 10/07/2016  . Near syncope 07/07/2016  . Cardiomyopathy ([place]) 07/07/2016  . NSVT (nonsustained ventricular tachycardia) ([place]) 07/06/2016  . Hypoglycemia 07/06/2016  . Alcohol abuse 05/13/2016  . ESRD (end stage renal disease) ([place]) 05/12/2016  . Hypertensive urgency 12/08/2013  . Cardiomyopathy secondary 10/09/2011  . Essential hypertension 10/09/2011  . Alcohol withdrawal delirium ([place]) 09/25/2011  . Systolic and diastolic CHF, chronic ([place]) 09/24/2011    Past Surgical History:  Procedure Laterality Date  . AV FISTULA PLACEMENT Left 05/14/2016   Procedure: LEFT ARM BASILIC VEIN TRANSPOSITION;  Surgeon: Rosetta Posner, MD;  Location: [facility];  Service: Vascular;   Laterality: Left;  . PERIPHERAL VASCULAR CATHETERIZATION N/A 05/13/2016   Procedure: Dialysis/Perma Catheter Insertion;  Surgeon: Serafina Mitchell, MD;  Location: [facility];  Service: Cardiovascular;  Laterality: N/A;       Home Medications    Prior to Admission medications   Medication Sig Start Date End Date Taking? Authorizing Provider  carvedilol (COREG) 12.5 MG tablet Take 1 tab by mouth twice daily on dialysis days. 07/22/16   Brittainy Erie Noe, PA-C  carvedilol (COREG) 25 MG tablet Take 1 tablet by mouth twice daily on non dialysis days. 07/22/16   Evelene Croon Barrett, PA-C    Family History Family History  Problem Relation Age of Onset  . Emphysema Mother   . Cirrhosis Father     Social History Social History  Substance Use Topics  . Smoking status: Former Smoker    Quit date: 08/15/2014  . Smokeless tobacco: Current User  . Alcohol use 1.8 oz/week    3 Cans of beer per week     Comment: occ     Allergies   Patient has no known allergies.   Review of Systems Review of Systems  Neurological: Positive for syncope.  All other systems reviewed and are negative.    Physical Exam Updated Vital Signs BP 148/96   Pulse 71   Temp 97.9 F (36.6 C) (Oral)   Resp 14   SpO2 95%   Physical Exam  Constitutional:  He is oriented to person, place, and time. He appears well-developed and well-nourished.  HENT:  Head: Normocephalic and atraumatic.  Right Ear: External ear normal.  Left Ear: External ear normal.  Nose: Nose normal.  Mouth/Throat: Oropharynx is clear and moist.  Eyes: Conjunctivae and EOM are normal. Pupils are equal, round, and reactive to light.  Neck: Normal range of motion. Neck supple.  Cardiovascular: Normal rate, regular rhythm, normal heart sounds and intact distal pulses.   Pulmonary/Chest: Effort normal and breath sounds normal.  Abdominal: Soft. Bowel sounds are normal.  Musculoskeletal: Normal range of motion.  Tunneled cath  right chest AVF LUE good thrill  Neurological: He is alert and oriented to person, place, and time.  Skin:  Skin is cool  Psychiatric: He has a normal mood and affect. His behavior is normal. Judgment and thought content normal.  Nursing note and vitals reviewed.    ED Treatments / Results  Labs (all labs ordered are listed, but only abnormal results are displayed) Labs Reviewed  CBC WITH DIFFERENTIAL/PLATELET - Abnormal; Notable for the following:       Result Value   WBC 10.8 (*)    RDW 16.0 (*)    Neutro Abs 8.4 (*)    All other components within normal limits  COMPREHENSIVE METABOLIC PANEL - Abnormal; Notable for the following:    Sodium 133 (*)    Potassium 5.8 (*)    Chloride 92 (*)    Glucose, Bld 113 (*)    BUN 32 (*)    Creatinine, Ser 8.78 (*)    Total Protein 10.4 (*)    ALT 12 (*)    GFR calc non Af Amer 6 (*)    GFR calc Af Amer 7 (*)    All other components within normal limits  AMMONIA  ETHANOL  PROTIME-INR    EKG  EKG Interpretation  Date/Time:  Tuesday October 07 2016 20:56:05 EST Ventricular Rate:  65 PR Interval:    QRS Duration: 135 QT Interval:  494 QTC Calculation: 514 R Axis:   7 Text Interpretation:  Sinus rhythm Left ventricular hypertrophy Consider inferior infarct Prolonged QT interval Confirmed by Gilford Raid MD, Zachariah Pavek ([id number]) on 10/07/2016 9:00:27 PM       Radiology Dg Chest 2 View  Result Date: 10/07/2016 CLINICAL DATA:  Altered mental status. EXAM: CHEST  2 VIEW COMPARISON:  Chest radiograph 07/05/2016 FINDINGS: Right internal jugular approach dialysis catheter tip overlies the right atrium. There is mild aortic arch atherosclerotic calcification. Cardiomediastinal contours are otherwise normal. No focal airspace consolidation or pulmonary edema. No pneumothorax or pleural effusion. IMPRESSION: No active cardiopulmonary disease. Aortic atherosclerosis. Electronically Signed   By: Ulyses Jarred M.D.   On: 10/07/2016 21:24   Ct Head  Wo Contrast  Result Date: 10/07/2016 CLINICAL DATA:  Found down hit head EXAM: CT HEAD WITHOUT CONTRAST TECHNIQUE: Contiguous axial images were obtained from the base of the skull through the vertex without intravenous contrast. COMPARISON:  05/15/2016 FINDINGS: Brain: No acute territorial infarction or intracranial hemorrhage is visualized. There is moderate atrophy. Moderate periventricular white matter hypodensities consistent with small vessel disease. Old right basal ganglial lacunar infarct. No mass or midline shift. Vascular: No hyperdense vessels. Carotid artery calcifications. Vertebral artery calcifications. Skull: Mastoid air cells are clear.  There is no fracture. Sinuses/Orbits: Mild mucosal thickening within the ethmoid sinuses. No acute orbital abnormality. Other: None IMPRESSION: 1. No CT evidence for acute intracranial abnormality 2. Moderate atrophy. Moderate subcortical and periventricular white matter  small vessel changes. Old right ganglial capsular lacunar infarct Electronically Signed   By: Donavan Foil M.D.   On: 10/07/2016 22:27    Procedures Procedures (including critical care time)  Medications Ordered in ED Medications  0.9 %  sodium chloride infusion ( Intravenous Stopped 10/07/16 2324)     Initial Impression / Assessment and Plan / ED Course  I have reviewed the triage vital signs and the nursing notes.  Pertinent labs & imaging results that were available during my care of the patient were reviewed by me and considered in my medical decision making (see chart for details).  Clinical Course     Hypothermia has improved with warmed fluids and warming blanket.  It is unclear what happened to patient, so I spoke with unassigned (triad) for observation admission and syncope work up.  Final Clinical Impressions(s) / ED Diagnoses   Final diagnoses:  Syncope, unspecified syncope type  Hypothermia, initial encounter  ESRD on hemodialysis [facility])    New  Prescriptions New Prescriptions   No medications on file     Isla Pence, MD 10/07/16 2332

## 2016-10-07 NOTE — ED Notes (Addendum)
Pt given bair hugger to warm him up

## 2016-10-07 NOTE — ED Triage Notes (Signed)
Per EMS: Pt to ED after being found on the side of [address]. Pt perked up on the truck after getting warm. Pt A&Ox4 now, but was slow at first.. Pt has dialysis Tuesday, Thursday, Saturday. Pt was seen here earlier today after a fall at dialysis. Pt has fistula on left arm. CBG was 98. P

## 2016-10-07 NOTE — ED Triage Notes (Signed)
Pt had fall after his dialysis today. Pt remembers falling and denies any LOC. Pt states he did hit his head on a scale.  Pt has no complaints was sent from dialysis center by [facility] to be evaluated. Pt alert and ox4. ambulatory at triage.

## 2016-10-07 NOTE — ED Notes (Signed)
Pt's name called to get vitals no answer. This tech asked Janett Billow RN if she saw him and she said no she didn't.

## 2016-10-08 ENCOUNTER — Other Ambulatory Visit: Payer: Self-pay

## 2016-10-08 ENCOUNTER — Encounter (HOSPITAL_COMMUNITY): Payer: Self-pay | Admitting: Internal Medicine

## 2016-10-08 DIAGNOSIS — I10 Essential (primary) hypertension: Secondary | ICD-10-CM

## 2016-10-08 DIAGNOSIS — D649 Anemia, unspecified: Secondary | ICD-10-CM | POA: Diagnosis present

## 2016-10-08 DIAGNOSIS — I132 Hypertensive heart and chronic kidney disease with heart failure and with stage 5 chronic kidney disease, or end stage renal disease: Secondary | ICD-10-CM | POA: Diagnosis present

## 2016-10-08 DIAGNOSIS — Z992 Dependence on renal dialysis: Secondary | ICD-10-CM | POA: Diagnosis not present

## 2016-10-08 DIAGNOSIS — R9431 Abnormal electrocardiogram [ECG] [EKG]: Secondary | ICD-10-CM | POA: Diagnosis not present

## 2016-10-08 DIAGNOSIS — I951 Orthostatic hypotension: Secondary | ICD-10-CM | POA: Diagnosis present

## 2016-10-08 DIAGNOSIS — T68XXXA Hypothermia, initial encounter: Secondary | ICD-10-CM

## 2016-10-08 DIAGNOSIS — M898X9 Other specified disorders of bone, unspecified site: Secondary | ICD-10-CM | POA: Diagnosis present

## 2016-10-08 DIAGNOSIS — I4581 Long QT syndrome: Secondary | ICD-10-CM | POA: Diagnosis present

## 2016-10-08 DIAGNOSIS — Z87891 Personal history of nicotine dependence: Secondary | ICD-10-CM | POA: Diagnosis not present

## 2016-10-08 DIAGNOSIS — N186 End stage renal disease: Secondary | ICD-10-CM | POA: Diagnosis present

## 2016-10-08 DIAGNOSIS — W19XXXA Unspecified fall, initial encounter: Secondary | ICD-10-CM | POA: Diagnosis present

## 2016-10-08 DIAGNOSIS — I428 Other cardiomyopathies: Secondary | ICD-10-CM

## 2016-10-08 DIAGNOSIS — R748 Abnormal levels of other serum enzymes: Secondary | ICD-10-CM | POA: Diagnosis not present

## 2016-10-08 DIAGNOSIS — X31XXXA Exposure to excessive natural cold, initial encounter: Secondary | ICD-10-CM | POA: Diagnosis not present

## 2016-10-08 DIAGNOSIS — R55 Syncope and collapse: Secondary | ICD-10-CM

## 2016-10-08 DIAGNOSIS — I480 Paroxysmal atrial fibrillation: Secondary | ICD-10-CM | POA: Diagnosis present

## 2016-10-08 DIAGNOSIS — I5042 Chronic combined systolic (congestive) and diastolic (congestive) heart failure: Secondary | ICD-10-CM | POA: Diagnosis present

## 2016-10-08 DIAGNOSIS — Z79899 Other long term (current) drug therapy: Secondary | ICD-10-CM | POA: Diagnosis not present

## 2016-10-08 LAB — BASIC METABOLIC PANEL WITH GFR
Anion gap: 15 (ref 5–15)
BUN: 37 mg/dL — ABNORMAL HIGH (ref 6–20)
CO2: 26 mmol/L (ref 22–32)
Calcium: 8.7 mg/dL — ABNORMAL LOW (ref 8.9–10.3)
Chloride: 94 mmol/L — ABNORMAL LOW (ref 101–111)
Creatinine, Ser: 8.91 mg/dL — ABNORMAL HIGH (ref 0.61–1.24)
GFR calc Af Amer: 6 mL/min — ABNORMAL LOW
GFR calc non Af Amer: 6 mL/min — ABNORMAL LOW
Glucose, Bld: 136 mg/dL — ABNORMAL HIGH (ref 65–99)
Potassium: 4.4 mmol/L (ref 3.5–5.1)
Sodium: 135 mmol/L (ref 135–145)

## 2016-10-08 LAB — CBC
HCT: 37 % — ABNORMAL LOW (ref 39.0–52.0)
Hemoglobin: 13 g/dL (ref 13.0–17.0)
MCH: 32.9 pg (ref 26.0–34.0)
MCHC: 35.1 g/dL (ref 30.0–36.0)
MCV: 93.7 fL (ref 78.0–100.0)
Platelets: 219 K/uL (ref 150–400)
RBC: 3.95 MIL/uL — ABNORMAL LOW (ref 4.22–5.81)
RDW: 15.6 % — ABNORMAL HIGH (ref 11.5–15.5)
WBC: 7.4 K/uL (ref 4.0–10.5)

## 2016-10-08 LAB — CORTISOL: CORTISOL PLASMA: 9 ug/dL

## 2016-10-08 LAB — TROPONIN I
TROPONIN I: 0.03 ng/mL — AB (ref <0.03)
Troponin I: 0.04 ng/mL (ref <0.03)
Troponin I: 0.03 ng/mL (ref <0.03)

## 2016-10-08 LAB — TSH: TSH: 1.391 u[IU]/mL (ref 0.350–4.500)

## 2016-10-08 LAB — MAGNESIUM: MAGNESIUM: 2.2 mg/dL (ref 1.7–2.4)

## 2016-10-08 MED ORDER — ACETAMINOPHEN 650 MG RE SUPP: 650.0000 mg | Freq: Four times a day (QID) | RECTAL | Status: DC | PRN

## 2016-10-08 MED ORDER — ACETAMINOPHEN 325 MG PO TABS
650.0000 mg | ORAL_TABLET | Freq: Four times a day (QID) | ORAL | Status: DC | PRN
  Administered 2016-10-09 – 2016-10-10 (×2): 650 mg via ORAL
  Filled 2016-10-08 (×2): qty 2

## 2016-10-08 MED ORDER — HEPARIN SODIUM (PORCINE) 5000 UNIT/ML IJ SOLN
5000.0000 [IU] | Freq: Three times a day (TID) | INTRAMUSCULAR | Status: DC
  Administered 2016-10-08 – 2016-10-10 (×6): 5000 [IU] via SUBCUTANEOUS
  Filled 2016-10-08 (×6): qty 1

## 2016-10-08 MED ORDER — ONDANSETRON HCL 4 MG/2ML IJ SOLN: 4.0000 mg | Freq: Four times a day (QID) | INTRAMUSCULAR | Status: DC | PRN

## 2016-10-08 MED ORDER — CARVEDILOL 12.5 MG PO TABS
12.5000 mg | ORAL_TABLET | Freq: Two times a day (BID) | ORAL | Status: DC
  Administered 2016-10-08 – 2016-10-10 (×3): 12.5 mg via ORAL
  Filled 2016-10-08 (×5): qty 1

## 2016-10-08 MED ORDER — ONDANSETRON HCL 4 MG PO TABS: 4.0000 mg | ORAL_TABLET | Freq: Four times a day (QID) | ORAL | Status: DC | PRN

## 2016-10-08 NOTE — Consult Note (Signed)
CARDIOLOGY CONSULT NOTE     Patient ID: Kenneth [name] MRN: [id number] DOB/AGE: Nov 18, 1955 60 y.o.  Admit date: 10/07/2016 Referring Physician Domenic Polite MD Primary Physician No PCP Per Patient Primary Cardiologist Toluwanimi Radebaugh [name] MD Reason for Consultation syncope  HPI: 60 yo BM seen for evaluation of syncope/near syncope. He was initially seen in July with acute renal failure, atrial flutter, and new onset cardiomyopathy with EF 25-30%. He had a history of heavy Etoh abuse. He was started on HD. He was placed on Coreg, hydralazine, and nitrates. He returned in September after feeling acutely weak and lightheaded. Was noted to have a 9 beat run of NSVT.  Myoview study showed inferior attenuation c/w soft tissue attenuation versus scar. No ischemia. EF 37%. He was discharged on Coreg only. Hydralazine and nitrates discontinued.  He is readmitted at this time with another episode of near syncope. Patient reports he fell down when getting on scales after dialysis yesterday. Felt lightheaded but denies LOC. No chest pain, dyspnea, palpitations. He was brought to the ED but became frustrated and left. Later found by the road and brought back to the ED. Noted to be confused and even now doesn't recall events after he left ED. He was hypothermic. He tells me that he often feels weak and lightheaded after dialysis and has to go sit down. Denies he ever lost consciousness.  Previously he was felt to be a poor candidate for anticoagulation due to compliance and Etoh use. He states now he only drinks an occasional beer. When admitted in September he was still in Afib but when seen in office 07/22/16 he was back in NSR.  Past Medical History:  Diagnosis Date  . Cardiomyopathy secondary    likely related to HTN heart disease; possibly ETOH related as well  . Chronic combined systolic and diastolic heart failure (HCC)    Echocardiogram 09/22/11: Moderate LVH, EF 63-01%, grade 3 diastolic dysfunction, mild MR,  moderate to severe LAE, mild RVE, mild to moderate TR, small to moderate pericardial effusion  . CKD (chronic kidney disease) stage 4, GFR 15-29 ml/min (HCC)    renal failure; due to hypertensive nephrosclerosis  . History of alcohol abuse   . Hypertension     Family History  Problem Relation Age of Onset  . Emphysema Mother   . Cirrhosis Father     Social History   Social History  . Marital status: Married    Spouse name: N/A  . Number of children: N/A  . Years of education: N/A   Occupational History  . Not on file.   Social History Main Topics  . Smoking status: Former Smoker    Quit date: 08/15/2014  . Smokeless tobacco: Current User  . Alcohol use 1.8 oz/week    3 Cans of beer per week     Comment: occ  . Drug use: No  . Sexual activity: Not on file   Other Topics Concern  . Not on file   Social History Narrative  . No narrative on file    Past Surgical History:  Procedure Laterality Date  . AV FISTULA PLACEMENT Left 05/14/2016   Procedure: LEFT ARM BASILIC VEIN TRANSPOSITION;  Surgeon: Rosetta Posner, MD;  Location: [facility];  Service: Vascular;  Laterality: Left;  . PERIPHERAL VASCULAR CATHETERIZATION N/A 05/13/2016   Procedure: Dialysis/Perma Catheter Insertion;  Surgeon: Serafina Mitchell, MD;  Location: [facility];  Service: Cardiovascular;  Laterality: N/A;     No current facility-administered medications  on file prior to encounter.    Current Outpatient Prescriptions on File Prior to Encounter  Medication Sig Dispense Refill  . carvedilol (COREG) 12.5 MG tablet Take 1 tab by mouth twice daily on dialysis days. (Patient taking differently: Take 12.5 mg by mouth See admin instructions. Take 12.5 mg by mouth twice daily on dialysis days) 60 tablet 3  . carvedilol (COREG) 25 MG tablet Take 1 tablet by mouth twice daily on non dialysis days. (Patient taking differently: Take 25 mg by mouth See admin instructions. Take 25 mg by mouth twice daily on non dialysis  days.) 60 tablet 3    ROS: As noted in HPI. All other systems are reviewed and are negative unless otherwise mentioned.   Physical Exam: Blood pressure 116/66, pulse 91, temperature 98.4 F (36.9 C), temperature source Oral, resp. rate 16, height 6' (1.829 m), weight 167 lb 5.3 oz (75.9 kg), SpO2 94 %. Current Weight  10/08/16 167 lb 5.3 oz (75.9 kg)  08/15/16 175 lb (79.4 kg)  07/22/16 181 lb (82.1 kg)   GENERAL:  WDBM in NAD HEENT:  PERRL, EOMI, sclera are clear. Oropharynx is clear. Atraumatic. NECK:  No jugular venous distention, carotid upstroke brisk and symmetric, no bruits, no thyromegaly or adenopathy LUNGS:  Clear to auscultation bilaterally CHEST:  Unremarkable HEART:  RRR,  PMI not displaced or sustained,S1 and S2 within normal limits, no S3, no S4: no clicks, no rubs, no murmurs ABD:  Soft, nontender. BS +, no masses or bruits. No hepatomegaly, no splenomegaly EXT:  2 + pulses throughout, no edema, no cyanosis no clubbing, AV fistula left arm. SKIN:  Warm and dry.  No rashes NEURO:  Alert and oriented x 3. Cranial nerves II through XII intact. PSYCH:  Cognitively intact    Labs:   Lab Results  Component Value Date   WBC 7.4 10/08/2016   HGB 13.0 10/08/2016   HCT 37.0 (L) 10/08/2016   MCV 93.7 10/08/2016   PLT 219 10/08/2016    Recent Labs Lab 10/07/16 2210 10/08/16 0214  NA 133* 135  K 5.8* 4.4  CL 92* 94*  CO2 26 26  BUN 32* 37*  CREATININE 8.78* 8.91*  CALCIUM 9.2 8.7*  PROT 10.4*  --   BILITOT 1.0  --   ALKPHOS 71  --   ALT 12*  --   AST 21  --   GLUCOSE 113* 136*   Lab Results  Component Value Date   CKTOTAL 158 09/22/2011   CKTOTAL 150 09/22/2011   CKTOTAL 184 09/21/2011   CKMB 5.1 (H) 09/22/2011   CKMB 5.2 (H) 09/22/2011   CKMB 6.7 (HH) 09/21/2011   TROPONINI 0.03 (HH) 10/08/2016   TROPONINI 0.04 (HH) 10/08/2016   TROPONINI 0.03 (HH) 10/08/2016   No results found for: CHOL No results found for: HDL No results found for: LDLCALC No  results found for: TRIG No results found for: CHOLHDL No results found for: LDLDIRECT  Lab Results  Component Value Date   PROBNP 5,715.0 (H) 12/08/2013   PROBNP 5,234.0 (H) 12/08/2013   PROBNP 30315.0 (H) 09/21/2011   Lab Results  Component Value Date   TSH 1.391 10/08/2016   Lab Results  Component Value Date   HGBA1C 5.1 12/08/2013    Radiology: Dg Chest 2 View  Result Date: 10/07/2016 CLINICAL DATA:  Altered mental status. EXAM: CHEST  2 VIEW COMPARISON:  Chest radiograph 07/05/2016 FINDINGS: Right internal jugular approach dialysis catheter tip overlies the right atrium. There is mild  aortic arch atherosclerotic calcification. Cardiomediastinal contours are otherwise normal. No focal airspace consolidation or pulmonary edema. No pneumothorax or pleural effusion. IMPRESSION: No active cardiopulmonary disease. Aortic atherosclerosis. Electronically Signed   By: Ulyses Jarred M.D.   On: 10/07/2016 21:24   Ct Head Wo Contrast  Result Date: 10/07/2016 CLINICAL DATA:  Found down hit head EXAM: CT HEAD WITHOUT CONTRAST TECHNIQUE: Contiguous axial images were obtained from the base of the skull through the vertex without intravenous contrast. COMPARISON:  05/15/2016 FINDINGS: Brain: No acute territorial infarction or intracranial hemorrhage is visualized. There is moderate atrophy. Moderate periventricular white matter hypodensities consistent with small vessel disease. Old right basal ganglial lacunar infarct. No mass or midline shift. Vascular: No hyperdense vessels. Carotid artery calcifications. Vertebral artery calcifications. Skull: Mastoid air cells are clear.  There is no fracture. Sinuses/Orbits: Mild mucosal thickening within the ethmoid sinuses. No acute orbital abnormality. Other: None IMPRESSION: 1. No CT evidence for acute intracranial abnormality 2. Moderate atrophy. Moderate subcortical and periventricular white matter small vessel changes. Old right ganglial capsular lacunar  infarct Electronically Signed   By: Donavan Foil M.D.   On: 10/07/2016 22:27    EKG: 10/07/16: NSR rate 65. LVH. Prolonged QTc 514 msec. I have personally reviewed and interpreted this study.  Telemetry: NSR with Occ. PVC, some bigeminy.  ASSESSMENT AND PLAN:  1. Near syncope. Suspect postural hypotension with volume shift post dialysis. No true loss of consciousness. Doubt arrhythmia. May need to reduce or hold Coreg prior to dialysis. Will update Echo to assess LV function. Continue to monitor on telemetry. Repeat Ecg.  2. Minimal troponin elevation with flat trend. No evidence of ACS. 3. History of atrial flutter/fibrillation- resolved. 4. ESRD on HD 5. Prolonged QT. New. Potassium 5.8>>4.4. Will check magnesium level. Repeat Ecg. Avoid QT prolonging drugs.  6. Nonischemic cardiomyopathy. Update Echo. Therapy limited by hypotension and ESRD.  7. History of ETOH abuse. Alcohol level normal on admit. Reports less Etoh intake.  Signed: Keeven Matty [name], [place]  10/08/2016, 3:46 PM

## 2016-10-08 NOTE — H&P (Signed)
History and Physical    Kenneth [name] VWU:[id number] DOB: 1956-06-15 DOA: 10/07/2016  PCP: No PCP Per Patient  Patient coming from: Home.  Chief Complaint: Fall.  HPI: Kenneth [name] is a 60 y.o. male with ESRD on hemodialysis on Tuesday Thursday and Saturday was brought to the ER after patient had a fall at the dialysis center. Patient states that after the dialysis patient was being checked on his weight when he suddenly fell. He denies hitting his head or losing consciousness. Patient was brought to the ER. In the ER while waiting patient got frustrated and walked outside of the hospital. Patient was found on the road by the ambulance and was brought to the ER. ER physician initially noticed the patient was confused and did not know why he was on the road. Felt that patient may have lost consciousness. Patient on my exam states that he felt exhausted walking and was sitting on the road. CT head was unremarkable. On arrival patient was hypothermic which improved with warming blankets. Patient is being admitted for observation.   ED Course: EKG shows prolonged QTC of 514 ms. CT head is unremarkable chest x-ray was unremarkable.  Review of Systems: As per HPI, rest all negative.   Past Medical History:  Diagnosis Date  . Cardiomyopathy secondary    likely related to HTN heart disease; possibly ETOH related as well  . Chronic combined systolic and diastolic heart failure (HCC)    Echocardiogram 09/22/11: Moderate LVH, EF 29-56%, grade 3 diastolic dysfunction, mild MR, moderate to severe LAE, mild RVE, mild to moderate TR, small to moderate pericardial effusion  . CKD (chronic kidney disease) stage 4, GFR 15-29 ml/min (HCC)    renal failure; due to hypertensive nephrosclerosis  . History of alcohol abuse   . Hypertension     Past Surgical History:  Procedure Laterality Date  . AV FISTULA PLACEMENT Left 05/14/2016   Procedure: LEFT ARM BASILIC VEIN TRANSPOSITION;  Surgeon: Rosetta Posner,  MD;  Location: [facility];  Service: Vascular;  Laterality: Left;  . PERIPHERAL VASCULAR CATHETERIZATION N/A 05/13/2016   Procedure: Dialysis/Perma Catheter Insertion;  Surgeon: Serafina Mitchell, MD;  Location: [facility];  Service: Cardiovascular;  Laterality: N/A;     reports that he quit smoking about 2 years ago. He uses smokeless tobacco. He reports that he drinks about 1.8 oz of alcohol per week . He reports that he does not use drugs.  No Known Allergies  Family History  Problem Relation Age of Onset  . Emphysema Mother   . Cirrhosis Father     Prior to Admission medications   Medication Sig Start Date End Date Taking? Authorizing Provider  carvedilol (COREG) 12.5 MG tablet Take 1 tab by mouth twice daily on dialysis days. 07/22/16   Brittainy Erie Noe, PA-C  carvedilol (COREG) 25 MG tablet Take 1 tablet by mouth twice daily on non dialysis days. 07/22/16   Lonn Georgia, PA-C    Physical Exam: Vitals:   10/07/16 2315 10/07/16 2321 10/08/16 0000 10/08/16 0031  BP: 148/96  157/98 160/93  Pulse: 71  80   Resp: 14  21 20   Temp:  97.9 F (36.6 C)  97.5 F (36.4 C)  TempSrc:  Oral    SpO2: 95%  100%   Weight:    75.9 kg (167 lb 5.3 oz)  Height:    6' (1.829 m)      Constitutional: Moderately built and nourished. Vitals:   10/07/16 2315  10/07/16 2321 10/08/16 0000 10/08/16 0031  BP: 148/96  157/98 160/93  Pulse: 71  80   Resp: 14  21 20   Temp:  97.9 F (36.6 C)  97.5 F (36.4 C)  TempSrc:  Oral    SpO2: 95%  100%   Weight:    75.9 kg (167 lb 5.3 oz)  Height:    6' (1.829 m)   Eyes: Anicteric. No pallor. ENMT: No discharge from the ears eyes nose and mouth. Neck: No mass felt. No neck rigidity. Respiratory: No rhonchi or crepitations. Cardiovascular: S1-S2 heard no murmurs appreciated. Abdomen: Soft nontender bowel sounds present. Musculoskeletal: No edema. No joint effusion. Skin: No rash. Skin appears warm. Neurologic: Alert awake oriented to time  place and person. Moves all extremities. No facial asymmetry. Psychiatric: Appears normal. Normal affect.   Labs on Admission: I have personally reviewed following labs and imaging studies  CBC:  Recent Labs Lab 10/07/16 2210  WBC 10.8*  NEUTROABS 8.4*  HGB 14.5  HCT 42.2  MCV 95.5  PLT 993   Basic Metabolic Panel:  Recent Labs Lab 10/07/16 2210  NA 133*  K 5.8*  CL 92*  CO2 26  GLUCOSE 113*  BUN 32*  CREATININE 8.78*  CALCIUM 9.2   GFR: Estimated Creatinine Clearance: 9.4 mL/min (by C-G formula based on SCr of 8.78 mg/dL (H)). Liver Function Tests:  Recent Labs Lab 10/07/16 2210  AST 21  ALT 12*  ALKPHOS 71  BILITOT 1.0  PROT 10.4*  ALBUMIN 4.1   No results for input(s): LIPASE, AMYLASE in the last 168 hours.  Recent Labs Lab 10/07/16 2210  AMMONIA 23   Coagulation Profile:  Recent Labs Lab 10/07/16 2210  INR 1.01   Cardiac Enzymes: No results for input(s): CKTOTAL, CKMB, CKMBINDEX, TROPONINI in the last 168 hours. BNP (last 3 results) No results for input(s): PROBNP in the last 8760 hours. HbA1C: No results for input(s): HGBA1C in the last 72 hours. CBG: No results for input(s): GLUCAP in the last 168 hours. Lipid Profile: No results for input(s): CHOL, HDL, LDLCALC, TRIG, CHOLHDL, LDLDIRECT in the last 72 hours. Thyroid Function Tests: No results for input(s): TSH, T4TOTAL, FREET4, T3FREE, THYROIDAB in the last 72 hours. Anemia Panel: No results for input(s): VITAMINB12, FOLATE, FERRITIN, TIBC, IRON, RETICCTPCT in the last 72 hours. Urine analysis:    Component Value Date/Time   COLORURINE YELLOW 05/12/2016 0834   APPEARANCEUR CLOUDY (A) 05/12/2016 0834   LABSPEC 1.014 05/12/2016 0834   PHURINE 5.0 05/12/2016 0834   GLUCOSEU NEGATIVE 05/12/2016 0834   HGBUR MODERATE (A) 05/12/2016 0834   BILIRUBINUR NEGATIVE 05/12/2016 0834   KETONESUR NEGATIVE 05/12/2016 0834   PROTEINUR 100 (A) 05/12/2016 0834   UROBILINOGEN 0.2 12/09/2013  0013   NITRITE NEGATIVE 05/12/2016 0834   LEUKOCYTESUR NEGATIVE 05/12/2016 0834   Sepsis Labs: @LABRCNTIP (procalcitonin:4,lacticidven:4) )No results found for this or any previous visit (from the past 240 hour(s)).   Radiological Exams on Admission: Dg Chest 2 View  Result Date: 10/07/2016 CLINICAL DATA:  Altered mental status. EXAM: CHEST  2 VIEW COMPARISON:  Chest radiograph 07/05/2016 FINDINGS: Right internal jugular approach dialysis catheter tip overlies the right atrium. There is mild aortic arch atherosclerotic calcification. Cardiomediastinal contours are otherwise normal. No focal airspace consolidation or pulmonary edema. No pneumothorax or pleural effusion. IMPRESSION: No active cardiopulmonary disease. Aortic atherosclerosis. Electronically Signed   By: Ulyses Jarred M.D.   On: 10/07/2016 21:24   Ct Head Wo Contrast  Result Date: 10/07/2016 CLINICAL  DATA:  Found down hit head EXAM: CT HEAD WITHOUT CONTRAST TECHNIQUE: Contiguous axial images were obtained from the base of the skull through the vertex without intravenous contrast. COMPARISON:  05/15/2016 FINDINGS: Brain: No acute territorial infarction or intracranial hemorrhage is visualized. There is moderate atrophy. Moderate periventricular white matter hypodensities consistent with small vessel disease. Old right basal ganglial lacunar infarct. No mass or midline shift. Vascular: No hyperdense vessels. Carotid artery calcifications. Vertebral artery calcifications. Skull: Mastoid air cells are clear.  There is no fracture. Sinuses/Orbits: Mild mucosal thickening within the ethmoid sinuses. No acute orbital abnormality. Other: None IMPRESSION: 1. No CT evidence for acute intracranial abnormality 2. Moderate atrophy. Moderate subcortical and periventricular white matter small vessel changes. Old right ganglial capsular lacunar infarct Electronically Signed   By: Donavan Foil M.D.   On: 10/07/2016 22:27    EKG: Independently  reviewed. Normal sinus rhythm with prolonged QT at 514 ms.  Assessment/Plan Principal Problem:   Near syncope Active Problems:   Essential hypertension   Syncope   Hypothermia   ESRD on hemodialysis ([place])   Fall    1. Fall/near syncope - patient denies losing consciousness. States he had a fall at the dialysis center. Patient's QT interval is prolonged at 514 ms. Patient had extensive workup done during September admission this year. At that time stress test was negative for ischemia and 2-D echo done in July showed EF of 25-30% and cardiology felt patient was not a candidate for ICD. At this time we will observe in telemetry. Continue Coreg. 2. Hypothermia - probably secondary to the cold weather. Patient was found outside. Does not have any signs of sepsis. Temperature improved with warming blankets. Follow TSH and cortisol. 3. ESRD on hemodialysis on Tuesday Thursday and Saturday - patient states he had completed his dialysis yesterday and denies any shortness of breath at this time. 4. History of paroxysmal atrial fibrillation - chads 2 vasc score of 4. Patient was felt not a candidate for anticoagulation. On Coreg. 5. Hypertension on Coreg.   DVT prophylaxis: Heparin. Code Status: Full code.  Family Communication: Discussed with patient.  Disposition Plan: Home.  Consults called: None.  Admission status: Observation and    Rise Patience MD [facility] Pager [phone].  If 7PM-7AM, please contact night-coverage [email or web address] Password [email or web address]  10/08/2016, 1:56 AM

## 2016-10-08 NOTE — Progress Notes (Signed)
New Admission Note:   Arrival Method: Stretcher from ED Mental Orientation: Alert and orientedx4 Telemetry: Box #10 Assessment: Completed Skin: Intact IV: Rt AC Pain: Denies Tubes: N/A Safety Measures: Safety Fall Prevention Plan has been given, discussed. Admission: Completed [address] Orientation: Patient has been orientated to the room, unit and staff.  Family: None at bedside  Orders have been reviewed and implemented. Will continue to monitor the patient. Call light has been placed within reach and bed alarm has been activated.   [employer], RN-BC Phone number: [phone]

## 2016-10-08 NOTE — Consult Note (Signed)
Renal Service Consult Note [facility]  Kenneth [name] 10/08/2016 Kenneth [name] Requesting Physician: Dr Broadus John  Reason for Consult:  ESRD with syncope HPI: The patient is a 60 y.o. year-old with history of HTN, etoh abuse and combined CHF, started HD in July 2017.  Was here in September with near syncope and NSVT, see below.  Rx'd with Coreg.  Now here d/t fall after HD.  Hit his head on the scale. Did not pass out.  Was seen in ED reportedly got frustrated and walked out of the ED. Then was found on the side of the road on [address] a couple of hours later and brought back to the ED.  Patient didn't recollect what events transpired to end with him on the side of the road.    At this time is in no distress, declines to give any history, ok to examine though.     Chart review: Sept 2017 > near-syncopal episode after dialysis.  Last etoh 2 days prior.  Dx'd w new low EF in July 2017 EF 25-30%. IIn hospital had asymptomatic NSVT rx'd with Coreg and it resolved.  Myoview stress was negative.  Seen by cardiology who said they weren't sure that problems were due to VT and that he wouldn't be a great candidate for ICD with ESRD, recommended no ICD.    July 2017 > AMS d/t meds vs etoh w/d, CT neg for CVA.  Acute on CKD4, started on HD.  Left BVT done 05/14/16 and TDC.  Afib/ flutter.  New low EF 25-30% by ECHO.  ^LFT"s c/w etoh, Rx CIWA protocol.  HTN urgency on admit, resolve.d   Feb 2015 > HTN'sive urgency, acute diast CHF, CKD 4/5, aenmia, diast CHF, etoh use, poor compliance   ROS  refused   Past Medical History  Past Medical History:  Diagnosis Date  . Cardiomyopathy secondary    likely related to HTN heart disease; possibly ETOH related as well  . Chronic combined systolic and diastolic heart failure (HCC)    Echocardiogram 09/22/11: Moderate LVH, EF 63-01%, grade 3 diastolic dysfunction, mild MR, moderate to severe LAE, mild RVE, mild to moderate TR, small to moderate  pericardial effusion  . CKD (chronic kidney disease) stage 4, GFR 15-29 ml/min (HCC)    renal failure; due to hypertensive nephrosclerosis  . History of alcohol abuse   . Hypertension    Past Surgical History  Past Surgical History:  Procedure Laterality Date  . AV FISTULA PLACEMENT Left 05/14/2016   Procedure: LEFT ARM BASILIC VEIN TRANSPOSITION;  Surgeon: Rosetta Posner, MD;  Location: [facility];  Service: Vascular;  Laterality: Left;  . PERIPHERAL VASCULAR CATHETERIZATION N/A 05/13/2016   Procedure: Dialysis/Perma Catheter Insertion;  Surgeon: Serafina Mitchell, MD;  Location: [facility];  Service: Cardiovascular;  Laterality: N/A;   Family History  Family History  Problem Relation Age of Onset  . Emphysema Mother   . Cirrhosis Father    Social History  reports that he quit smoking about 2 years ago. He uses smokeless tobacco. He reports that he drinks about 1.8 oz of alcohol per week . He reports that he does not use drugs. Allergies No Known Allergies Home medications Prior to Admission medications   Medication Sig Start Date End Date Taking? Authorizing Provider  carvedilol (COREG) 12.5 MG tablet Take 1 tab by mouth twice daily on dialysis days. Patient taking differently: Take 12.5 mg by mouth See admin instructions. Take 12.5 mg by mouth twice  daily on dialysis days 07/22/16  Yes Brittainy Erie Noe, PA-C  carvedilol (COREG) 25 MG tablet Take 1 tablet by mouth twice daily on non dialysis days. Patient taking differently: Take 25 mg by mouth See admin instructions. Take 25 mg by mouth twice daily on non dialysis days. 07/22/16  Yes Evelene Croon Barrett, PA-C   Liver Function Tests  Recent Labs Lab 10/07/16 2210  AST 21  ALT 12*  ALKPHOS 71  BILITOT 1.0  PROT 10.4*  ALBUMIN 4.1   No results for input(s): LIPASE, AMYLASE in the last 168 hours. CBC  Recent Labs Lab 10/07/16 2210 10/08/16 0214  WBC 10.8* 7.4  NEUTROABS 8.4*  --   HGB 14.5 13.0  HCT 42.2 37.0*  MCV 95.5  93.7  PLT 245 027   Basic Metabolic Panel  Recent Labs Lab 10/07/16 2210 10/08/16 0214  NA 133* 135  K 5.8* 4.4  CL 92* 94*  CO2 26 26  GLUCOSE 113* 136*  BUN 32* 37*  CREATININE 8.78* 8.91*  CALCIUM 9.2 8.7*   Iron/TIBC/Ferritin/ %Sat    Component Value Date/Time   IRON 51 05/22/2016 0414   TIBC 262 05/22/2016 0414   FERRITIN 113 05/22/2016 0414   IRONPCTSAT 19 05/22/2016 0414    Vitals:   10/08/16 0031 10/08/16 0442 10/08/16 0957 10/08/16 1055  BP: 160/93 122/69 116/66   Pulse:  99 91   Resp: 20 16 17 16   Temp: 97.5 F (36.4 C) 97.4 F (36.3 C) 98.4 F (36.9 C)   TempSrc:  Oral Oral   SpO2:  96% 96% 94%  Weight: 75.9 kg (167 lb 5.3 oz)     Height: 6' (1.829 m)      Exam Gen alert, WDWN, no distress, lying flat No rash, cyanosis or gangrene Sclera anicteric, throat clear  No jvd or bruits Chest clear bilat RRR no MRG Abd soft ntnd no mass or ascites +bs GU defer MS no joint effusions or deformity Ext no LE or UE edema / no wounds or ulcers Neuro is alert, Ox 3 , nf R IJ cath/  LUA AVF well-developed with +bruit    Dialysis: TTS [facility]   4h 8min  77.5kg   2/2 bath  Hep 2400   R IJ TDC/ LUA AVF - using AVF V- 50mg / hd C- 1.5ug tiw No esa  Assessment: 1. Syncope - presumably, not sure what happened when he was driving.  Just started HD in July and could be gaining body weight so we will closely look at his volume status and consider increasing EDW. Also consider arrhythmia, other.   2. ESRD started HD July 3. CM EF 25-30% 4. Volume - is under dry wt slightly.  Looks possibly dry on exam.  BP's 110-120's.  5. HTN - not HTN'sive, takes coreg at home, only BP lowering medication 6. Hx etoh abuse - no signs of intoxication or w/d    Plan - HD tomorrow, no fluid off, let volume come up some.  Get orthostatics.   Kelly Splinter MD [employer] pager [phone]   10/08/2016, 2:40 PM

## 2016-10-08 NOTE — Progress Notes (Addendum)
Pt seen and examined, admitted earlier this am by Dr.Kakrakandy 60/M with ESRD, Cardiomyopathy EF of 25%, admitted after syncope post HD, 2 episodes yesterday. Extensive cardiac workup 9/17 , Myoview with medium defect of moderate severity present in the basal inferior, mid inferior and apical inferior location, Findings consistent with prior myocardial infarction,  No significant ischemia, felt to be an intermediate risk study. No cath performed then -Check Orthostatics  -will also Cards to evaluate, due to recurrent syncope, cardiomyopathy, recent admit with NSVT and prolonged QTc -Continue Coreg -Renal to FU due to ESRD  Kenneth [name]

## 2016-10-09 ENCOUNTER — Inpatient Hospital Stay (HOSPITAL_COMMUNITY): Payer: Medicare Other

## 2016-10-09 LAB — BASIC METABOLIC PANEL
ANION GAP: 18 — AB (ref 5–15)
BUN: 59 mg/dL — AB (ref 6–20)
CHLORIDE: 93 mmol/L — AB (ref 101–111)
CO2: 24 mmol/L (ref 22–32)
Calcium: 8.7 mg/dL — ABNORMAL LOW (ref 8.9–10.3)
Creatinine, Ser: 11.79 mg/dL — ABNORMAL HIGH (ref 0.61–1.24)
GFR calc Af Amer: 5 mL/min — ABNORMAL LOW (ref >60)
GFR, EST NON AFRICAN AMERICAN: 4 mL/min — AB (ref >60)
GLUCOSE: 85 mg/dL (ref 65–99)
POTASSIUM: 4.5 mmol/L (ref 3.5–5.1)
Sodium: 135 mmol/L (ref 135–145)

## 2016-10-09 LAB — ECHOCARDIOGRAM COMPLETE
Height: 72 in
WEIGHTICAEL: 2765.45 [oz_av]

## 2016-10-09 LAB — CBC
HEMATOCRIT: 34 % — AB (ref 39.0–52.0)
Hemoglobin: 11.6 g/dL — ABNORMAL LOW (ref 13.0–17.0)
MCH: 32.2 pg (ref 26.0–34.0)
MCHC: 34.1 g/dL (ref 30.0–36.0)
MCV: 94.4 fL (ref 78.0–100.0)
PLATELETS: 216 10*3/uL (ref 150–400)
RBC: 3.6 MIL/uL — AB (ref 4.22–5.81)
RDW: 15.7 % — ABNORMAL HIGH (ref 11.5–15.5)
WBC: 6.3 10*3/uL (ref 4.0–10.5)

## 2016-10-09 LAB — MRSA PCR SCREENING: MRSA BY PCR: NEGATIVE

## 2016-10-09 MED ORDER — SODIUM CHLORIDE 0.9 % IV SOLN: 100.0000 mL | INTRAVENOUS | Status: DC | PRN

## 2016-10-09 MED ORDER — SODIUM CHLORIDE 0.9 % IV BOLUS (SEPSIS)
1000.0000 mL | Freq: Once | INTRAVENOUS | Status: AC
  Administered 2016-10-09: 1000 mL via INTRAVENOUS

## 2016-10-09 MED ORDER — PENTAFLUOROPROP-TETRAFLUOROETH EX AERO: 1.0000 "application " | INHALATION_SPRAY | CUTANEOUS | Status: DC | PRN

## 2016-10-09 MED ORDER — HEPARIN SODIUM (PORCINE) 1000 UNIT/ML DIALYSIS: 2400.0000 [IU] | Freq: Once | INTRAMUSCULAR | Status: DC

## 2016-10-09 MED ORDER — HEPARIN SODIUM (PORCINE) 1000 UNIT/ML DIALYSIS: 20.0000 [IU]/kg | INTRAMUSCULAR | Status: DC | PRN

## 2016-10-09 MED ORDER — LIDOCAINE-PRILOCAINE 2.5-2.5 % EX CREA: 1.0000 "application " | TOPICAL_CREAM | CUTANEOUS | Status: DC | PRN

## 2016-10-09 MED ORDER — LIDOCAINE HCL (PF) 1 % IJ SOLN: 5.0000 mL | INTRAMUSCULAR | Status: DC | PRN

## 2016-10-09 MED ORDER — HEPARIN SODIUM (PORCINE) 1000 UNIT/ML DIALYSIS: 1000.0000 [IU] | INTRAMUSCULAR | Status: DC | PRN

## 2016-10-09 MED ORDER — ALTEPLASE 2 MG IJ SOLR: 2.0000 mg | Freq: Once | INTRAMUSCULAR | Status: DC | PRN

## 2016-10-09 NOTE — Progress Notes (Signed)
PROGRESS NOTE    Kenneth [name]  OHY:[id number] DOB: 1956/09/22 DOA: 10/07/2016 PCP: No PCP Per Patient  Brief Narrative:  60/M with ESRD, Cardiomyopathy EF of 25%, admitted after syncope post HD, 2 on episodes 12/12. Extensive cardiac workup 9/17   Assessment & Plan:  Syncope post HD/Orthostatic Hypotension - 2 episodes 12/12 -suspect due to volume status-below dry weight -no fluid removal on HD today -recheck Orthostatics today -Extensive cardiac workup 9/17 , Myoview with medium defect of moderate severity present in the basal inferior, mid inferior and apical inferior location, Findings consistent with prior myocardial infarction,  No significant ischemia, felt to be an intermediate risk study. No cath performed then -Appreciate Cards input, therapy limited by Hypotension and ESRD -on Coreg -repeat ECHO  Hypothermia  -due to hypotension and weather, on admission -resolved -TSH and cortisol WNL  ESRD on hemodialysis on Tuesday Thursday and Saturday  -see discussion above may give IVF today  History of paroxysmal atrial fibrillation - chads 2 vasc score of 4.  - followed by cards, not felt to be a candidate for anticoagulation. - On Coreg -resolved  H/o EToh use -denies [place] at this time, ETOH level normal  Prolonged QTC -K and mag ok -QTC improved to 487  DVT prophylaxis: Heparin. Code Status: Full code.  Family Communication: Discussed with patient.  Disposition Plan: Home tomorrow if BP stable and symptoms better  Consultants:   Cards  Renal   Subjective: Feels a little weak but ok  Objective: Vitals:   10/09/16 0930 10/09/16 0959 10/09/16 1030 10/09/16 1100  BP: 110/69 120/70 108/68 111/82  Pulse: 80 79 91 82  Resp: 18 19 16 17   Temp:      TempSrc:      SpO2:      Weight:      Height:        Intake/Output Summary (Last 24 hours) at 10/09/16 1132 Last data filed at 10/09/16 0206  Gross per 24 hour  Intake              240 ml  Output                 0 ml  Net              240 ml   Filed Weights   10/08/16 0031 10/08/16 2033 10/09/16 0730  Weight: 75.9 kg (167 lb 5.3 oz) 76.2 kg (167 lb 15.9 oz) 78.4 kg (172 lb 13.5 oz)    Examination:  General exam: Appears calm and comfortable, AAOx3 Respiratory system: Clear to auscultation. Respiratory effort normal. Cardiovascular system: S1 & S2 heard, RRR. No JVD, murmurs, rubs, gallops or clicks. No pedal edema. Gastrointestinal system: Abdomen is nondistended, soft and nontender. No organomegaly or masses felt. Normal bowel sounds heard. Central nervous system: Alert and oriented. No focal neurological deficits. Extremities: Symmetric 5 x 5 power. Skin: No rashes, lesions or ulcers Psychiatry: flat affect    Data Reviewed: I have personally reviewed following labs and imaging studies  CBC:  Recent Labs Lab 10/07/16 2210 10/08/16 0214 10/09/16 0347  WBC 10.8* 7.4 6.3  NEUTROABS 8.4*  --   --   HGB 14.5 13.0 11.6*  HCT 42.2 37.0* 34.0*  MCV 95.5 93.7 94.4  PLT 245 219 948   Basic Metabolic Panel:  Recent Labs Lab 10/07/16 2210 10/08/16 0214 10/08/16 1638 10/09/16 0347  NA 133* 135  --  135  K 5.8* 4.4  --  4.5  CL 92* 94*  --  93*  CO2 26 26  --  24  GLUCOSE 113* 136*  --  85  BUN 32* 37*  --  59*  CREATININE 8.78* 8.91*  --  11.79*  CALCIUM 9.2 8.7*  --  8.7*  MG  --   --  2.2  --    GFR: Estimated Creatinine Clearance: 7.1 mL/min (by C-G formula based on SCr of 11.79 mg/dL (H)). Liver Function Tests:  Recent Labs Lab 10/07/16 2210  AST 21  ALT 12*  ALKPHOS 71  BILITOT 1.0  PROT 10.4*  ALBUMIN 4.1   No results for input(s): LIPASE, AMYLASE in the last 168 hours.  Recent Labs Lab 10/07/16 2210  AMMONIA 23   Coagulation Profile:  Recent Labs Lab 10/07/16 2210  INR 1.01   Cardiac Enzymes:  Recent Labs Lab 10/08/16 0214 10/08/16 0921 10/08/16 1233  TROPONINI 0.03* 0.04* 0.03*   BNP (last 3 results) No results for input(s):  PROBNP in the last 8760 hours. HbA1C: No results for input(s): HGBA1C in the last 72 hours. CBG: No results for input(s): GLUCAP in the last 168 hours. Lipid Profile: No results for input(s): CHOL, HDL, LDLCALC, TRIG, CHOLHDL, LDLDIRECT in the last 72 hours. Thyroid Function Tests:  Recent Labs  10/08/16 0214  TSH 1.391   Anemia Panel: No results for input(s): VITAMINB12, FOLATE, FERRITIN, TIBC, IRON, RETICCTPCT in the last 72 hours. Urine analysis:    Component Value Date/Time   COLORURINE YELLOW 05/12/2016 0834   APPEARANCEUR CLOUDY (A) 05/12/2016 0834   LABSPEC 1.014 05/12/2016 0834   PHURINE 5.0 05/12/2016 0834   GLUCOSEU NEGATIVE 05/12/2016 0834   HGBUR MODERATE (A) 05/12/2016 0834   BILIRUBINUR NEGATIVE 05/12/2016 0834   KETONESUR NEGATIVE 05/12/2016 0834   PROTEINUR 100 (A) 05/12/2016 0834   UROBILINOGEN 0.2 12/09/2013 0013   NITRITE NEGATIVE 05/12/2016 0834   LEUKOCYTESUR NEGATIVE 05/12/2016 0834   Sepsis Labs: @LABRCNTIP (procalcitonin:4,lacticidven:4)  )No results found for this or any previous visit (from the past 240 hour(s)).       Radiology Studies: Dg Chest 2 View  Result Date: 10/07/2016 CLINICAL DATA:  Altered mental status. EXAM: CHEST  2 VIEW COMPARISON:  Chest radiograph 07/05/2016 FINDINGS: Right internal jugular approach dialysis catheter tip overlies the right atrium. There is mild aortic arch atherosclerotic calcification. Cardiomediastinal contours are otherwise normal. No focal airspace consolidation or pulmonary edema. No pneumothorax or pleural effusion. IMPRESSION: No active cardiopulmonary disease. Aortic atherosclerosis. Electronically Signed   By: Ulyses Jarred M.D.   On: 10/07/2016 21:24   Ct Head Wo Contrast  Result Date: 10/07/2016 CLINICAL DATA:  Found down hit head EXAM: CT HEAD WITHOUT CONTRAST TECHNIQUE: Contiguous axial images were obtained from the base of the skull through the vertex without intravenous contrast. COMPARISON:   05/15/2016 FINDINGS: Brain: No acute territorial infarction or intracranial hemorrhage is visualized. There is moderate atrophy. Moderate periventricular white matter hypodensities consistent with small vessel disease. Old right basal ganglial lacunar infarct. No mass or midline shift. Vascular: No hyperdense vessels. Carotid artery calcifications. Vertebral artery calcifications. Skull: Mastoid air cells are clear.  There is no fracture. Sinuses/Orbits: Mild mucosal thickening within the ethmoid sinuses. No acute orbital abnormality. Other: None IMPRESSION: 1. No CT evidence for acute intracranial abnormality 2. Moderate atrophy. Moderate subcortical and periventricular white matter small vessel changes. Old right ganglial capsular lacunar infarct Electronically Signed   By: Donavan Foil M.D.   On: 10/07/2016 22:27        Scheduled Meds: . carvedilol  12.5  mg Oral BID WC  . [START ON 10/10/2016] heparin  2,400 Units Dialysis Once in dialysis  . heparin  5,000 Units Subcutaneous Q8H   Continuous Infusions:   LOS: 1 day    Time spent: 77min    Domenic Polite, MD [facility] Pager [phone]  If 7PM-7AM, please contact night-coverage [email or web address] Password [email or web address] 10/09/2016, 11:32 AM

## 2016-10-09 NOTE — Progress Notes (Signed)
  Echocardiogram 2D Echocardiogram has been performed.  Mistey Hoffert L Androw 10/09/2016, 1:15 PM

## 2016-10-09 NOTE — Progress Notes (Signed)
Pt reported pain in fingertips.   Upon assessment pt's nailbeds were white and no capillary refill was noted.   Applied heating packs in attempt to regain blood flow.   Paged and alerted doctor.   Will continue to monitor.  Paulla Fore, RN

## 2016-10-09 NOTE — Progress Notes (Signed)
  [facility] Progress Note   Subjective: no c/o'SUBJECTIVE:  Orthostatic BP's showed big drop of 60 pts , 133/68 lying > 78/59 standing  Vitals:   10/09/16 0959 10/09/16 1030 10/09/16 1100 10/09/16 1120  BP: 120/70 108/68 111/82 115/76  Pulse: 79 91 82 83  Resp: 19 16 17 17   Temp:    98.5 F (36.9 C)  TempSrc:    Oral  SpO2:    100%  Weight:    78.4 kg (172 lb 13.5 oz)  Height:        Inpatient medications: . carvedilol  12.5 mg Oral BID WC  . heparin  5,000 Units Subcutaneous Q8H    acetaminophen **OR** acetaminophen, ondansetron **OR** ondansetron (ZOFRAN) IV  Exam: Gen alert, WDWN, no distress, lying flat No jvd or bruits Chest clear bilat RRR no MRG Abd soft ntnd no mass or ascites +bs Ext no LE edema Neuro is alert, Ox 3 , nf R IJ cath/  LUA AVF well-developed with +bruit    Dialysis: TTS [facility]   4h 68min  77.5kg   2/2 bath  Hep 2400   R IJ TDC/ LUA AVF - using AVF V- 50mg / hd C- 1.5ug tiw No esa  Assessment: 1. Syncope - suspected.  + orthostatic BP drop. Plan NS bolus, no UF w HD today 2. ESRD HE tts.  Just started in July 3. CM EF 25-30% 4. Volume - is under dry wt slightly.  Looks possibly dry on exam.  BP's 110-120's.  5. HTN - not HTN'sive, takes coreg at home, only BP lowering medication 6. Hx etoh abuse - no signs of intoxication or w/d  Plan - NS bolus, get BP's up, no UF w HD.    Kelly Splinter MD [facility] pager [phone]   10/09/2016, 1:00 PM    Recent Labs Lab 10/07/16 2210 10/08/16 0214 10/09/16 0347  NA 133* 135 135  K 5.8* 4.4 4.5  CL 92* 94* 93*  CO2 26 26 24   GLUCOSE 113* 136* 85  BUN 32* 37* 59*  CREATININE 8.78* 8.91* 11.79*  CALCIUM 9.2 8.7* 8.7*    Recent Labs Lab 10/07/16 2210  AST 21  ALT 12*  ALKPHOS 71  BILITOT 1.0  PROT 10.4*  ALBUMIN 4.1    Recent Labs Lab 10/07/16 2210 10/08/16 0214 10/09/16 0347  WBC 10.8* 7.4 6.3  NEUTROABS 8.4*  --   --   HGB 14.5 13.0  11.6*  HCT 42.2 37.0* 34.0*  MCV 95.5 93.7 94.4  PLT 245 219 216   Iron/TIBC/Ferritin/ %Sat    Component Value Date/Time   IRON 51 05/22/2016 0414   TIBC 262 05/22/2016 0414   FERRITIN 113 05/22/2016 0414   IRONPCTSAT 19 05/22/2016 0414

## 2016-10-10 DIAGNOSIS — R748 Abnormal levels of other serum enzymes: Secondary | ICD-10-CM

## 2016-10-10 LAB — HEPATITIS B SURFACE ANTIGEN: HEP B S AG: NEGATIVE

## 2016-10-10 MED ORDER — CARVEDILOL 6.25 MG PO TABS: 6.2500 mg | ORAL_TABLET | Freq: Two times a day (BID) | ORAL | Status: DC

## 2016-10-10 MED ORDER — SODIUM CHLORIDE 0.9 % IV BOLUS (SEPSIS)
500.0000 mL | Freq: Once | INTRAVENOUS | Status: AC
  Administered 2016-10-10: 500 mL via INTRAVENOUS

## 2016-10-10 MED ORDER — SODIUM CHLORIDE 0.9 % IV BOLUS (SEPSIS): 500.0000 mL | Freq: Once | INTRAVENOUS | Status: DC

## 2016-10-10 NOTE — Progress Notes (Signed)
  [facility] Progress Note   Subjective:  Denies CP, dyspnea, dizziness. Remains orthostatic this morning at 10am. BP 108/61 standing -> 73/58 standing. Says he is eating and drinking ok. No edema.  Objective Vitals:   10/09/16 1849 10/09/16 2108 10/10/16 0632 10/10/16 1014  BP:  116/64 (!) 152/70   Pulse: 80 80 74   Resp: 18 18 18    Temp: 98.7 F (37.1 C) 98.3 F (36.8 C) 98.4 F (36.9 C) 97.8 F (36.6 C)  TempSrc: Oral Oral Oral Oral  SpO2:  98% 96% 96%  Weight:  78.5 kg (173 lb 1 oz)    Height:       Physical Exam General: Frail male, NAD. Lying flat in bed. Heart: RRR; no murmur Lungs: CTA anteriorly Abdomen: soft, non-tender Extremities: No LE edema Dialysis Access: TDC in R chest, LUE AVF which has been used as outpt.  Additional Objective Labs: Basic Metabolic Panel:  Recent Labs Lab 10/07/16 2210 10/08/16 0214 10/09/16 0347  NA 133* 135 135  K 5.8* 4.4 4.5  CL 92* 94* 93*  CO2 26 26 24   GLUCOSE 113* 136* 85  BUN 32* 37* 59*  CREATININE 8.78* 8.91* 11.79*  CALCIUM 9.2 8.7* 8.7*   Liver Function Tests:  Recent Labs Lab 10/07/16 2210  AST 21  ALT 12*  ALKPHOS 71  BILITOT 1.0  PROT 10.4*  ALBUMIN 4.1   CBC:  Recent Labs Lab 10/07/16 2210 10/08/16 0214 10/09/16 0347  WBC 10.8* 7.4 6.3  NEUTROABS 8.4*  --   --   HGB 14.5 13.0 11.6*  HCT 42.2 37.0* 34.0*  MCV 95.5 93.7 94.4  PLT 245 219 216   Cardiac Enzymes:  Recent Labs Lab 10/08/16 0214 10/08/16 0921 10/08/16 1233  TROPONINI 0.03* 0.04* 0.03*   Studies/Results: No results found. Medications:  . carvedilol  12.5 mg Oral BID WC  . heparin  5,000 Units Subcutaneous Q8H  . sodium chloride  500 mL Intravenous Once    Dialysis Orders: TTS at [facility],4h 73min 77.5kg, 2/2 bath, Hep 2400, R IJ TDC/ LUA AVF - using AVF V- 50mg / hd C- 1.5ug tiw No esa  Assessment/Plan: 1. Syncope (suspected): Orthostatic hypotension observed here, 1L NS given 12/14, remains  orthostatic so will give another 591mL today. Says that he is drinking adequately. Repeat echo with nl LVEF; no further cardiac work-up planned. TSH normal. Letting volume come up , suspect he is gaining body weight.  Repeat bolus NS today, no UF with HD Sat.  2. ESRD: Continue HD per TTS schedule, just started HD in 04/2016. Next HD 12/16. 3. Hx cardiomyopathy (EF 30% per nuclear stress test in 06/2016). Repeat Echo 12/14 showed normalized LVEF 55-60%, no WMAs.  4. BP/volume: Hypotensive and orthostatic, possibly dry on exam. NS bolus again, allowing weight to come up. Takes coreg 12.5mg  BID, reduce to 6.25mg  BID with holding parameters. 5. Hx EToH abuse - no signs of intoxication or withdrawal. 6. Anemia: Hgb 11.6: No ESA for now. 7. MBD: Ca 8.7. ?No binders. Will check Phos. 8. Hx PAF: in NSR now. Will dc coreg w orthostatic BP drops and syncope.   Veneta Penton, PA-C 10/10/2016, 11:18 AM  [facility] Pager: [phone]  Pt seen, examined, agree w assess/plan as above with additions as indicated.  Kelly Splinter MD [facility] pager [phone]    cell [phone] 10/10/2016, 3:53 PM

## 2016-10-10 NOTE — Progress Notes (Signed)
Patient Name: Kenneth [name] Date of Encounter: 10/10/2016  Primary Cardiologist: [name]  Hospital Problem List     Principal Problem:   Near syncope Active Problems:   Essential hypertension   Syncope   Hypothermia   ESRD on hemodialysis ([place])   Fall     Subjective   No complaints  Inpatient Medications    Scheduled Meds: . carvedilol  12.5 mg Oral BID WC  . heparin  5,000 Units Subcutaneous Q8H   Continuous Infusions:  PRN Meds: acetaminophen **OR** acetaminophen, ondansetron **OR** ondansetron (ZOFRAN) IV   Vital Signs    Vitals:   10/09/16 1849 10/09/16 2108 10/10/16 0632 10/10/16 1014  BP:  116/64 (!) 152/70   Pulse: 80 80 74   Resp: 18 18 18    Temp: 98.7 F (37.1 C) 98.3 F (36.8 C) 98.4 F (36.9 C) 97.8 F (36.6 C)  TempSrc: Oral Oral Oral Oral  SpO2:  98% 96% 96%  Weight:  173 lb 1 oz (78.5 kg)    Height:        Intake/Output Summary (Last 24 hours) at 10/10/16 1106 Last data filed at 10/10/16 0900  Gross per 24 hour  Intake              700 ml  Output               25 ml  Net              675 ml   Filed Weights   10/09/16 0730 10/09/16 1120 10/09/16 2108  Weight: 172 lb 13.5 oz (78.4 kg) 172 lb 13.5 oz (78.4 kg) 173 lb 1 oz (78.5 kg)    Physical Exam    GEN: Well nourished, well developed, in no acute distress.  HEENT: Grossly normal.  Neck: Supple, no JVD, carotid bruits, or masses. Cardiac: RRR, no murmurs, rubs, or gallops. No clubbing, cyanosis, edema.  Radials/DP/PT 2+ and equal bilaterally.  Respiratory:  Respirations regular and unlabored, clear to auscultation bilaterally. GI: Soft, nontender, nondistended, BS + x 4. MS: no deformity or atrophy. Skin: warm and dry, no rash. Neuro:  Strength and sensation are intact. Psych: AAOx3.  Normal affect.  Labs    CBC  Recent Labs  10/07/16 2210 10/08/16 0214 10/09/16 0347  WBC 10.8* 7.4 6.3  NEUTROABS 8.4*  --   --   HGB 14.5 13.0 11.6*  HCT 42.2 37.0* 34.0*  MCV 95.5  93.7 94.4  PLT 245 219 403   Basic Metabolic Panel  Recent Labs  10/08/16 0214 10/08/16 1638 10/09/16 0347  NA 135  --  135  K 4.4  --  4.5  CL 94*  --  93*  CO2 26  --  24  GLUCOSE 136*  --  85  BUN 37*  --  59*  CREATININE 8.91*  --  11.79*  CALCIUM 8.7*  --  8.7*  MG  --  2.2  --    Liver Function Tests  Recent Labs  10/07/16 2210  AST 21  ALT 12*  ALKPHOS 71  BILITOT 1.0  PROT 10.4*  ALBUMIN 4.1   No results for input(s): LIPASE, AMYLASE in the last 72 hours. Cardiac Enzymes  Recent Labs  10/08/16 0214 10/08/16 0921 10/08/16 1233  TROPONINI 0.03* 0.04* 0.03*   BNP Invalid input(s): POCBNP D-Dimer No results for input(s): DDIMER in the last 72 hours. Hemoglobin A1C No results for input(s): HGBA1C in the last 72 hours. Fasting Lipid Panel No results for  input(s): CHOL, HDL, LDLCALC, TRIG, CHOLHDL, LDLDIRECT in the last 72 hours. Thyroid Function Tests  Recent Labs  10/08/16 0214  TSH 1.391    Telemetry      ECG    NSR, NSSt - Personally Reviewed  Radiology    No results found.  Cardiac Studies   Normal EF by echo  Patient Profile     59 y/o with near syncope  Assessment & Plan    1) Near syncope:  Likely related to fluid shifts.  Normal LV and valvular function by echo. No further cardiac w/u.  Minimally elevated troponin likely due to renal failure.  Signed, Larae Grooms, MD  10/10/2016, 11:06 AM

## 2016-10-10 NOTE — Progress Notes (Deleted)
Cardiology Office Note    Date:  10/10/2016   ID:  Kenneth [name], DOB [birth year], MRN [id number]  PCP:  No PCP Per Patient  Cardiologist:  Peter [name], MD    History of Present Illness:  Kenneth [name] is a 60 y.o. male seen for follow up atrial flutter, NSVT, and CHF. He was admitted 11/25-12/4 with acute congestive heart failure and acute renal failure in the setting of hypertensive emergency.  He apparently had a history of hypertension which was untreated.  Echocardiogram 09/22/11: Moderate LVH, EF 02-77%, grade 3 diastolic dysfunction, mild MR, moderate to severe LAE, mild RVE, mild to moderate TR, small to moderate pericardial effusion. He was admitted in July 2017 with severe HTN, atrial flutter and ARF. Dialysis was initiatted. A repeat 2-D echocardiogram was done 05/12/2016 showed a reduced EF to 25-30% with diffuse hypokinesis, and moderate RV systolic dysfunction along with severe pulmonary hypertension, with PA peak pressure of 62 mmHg. Troponin was mildly elevated with flat trend. He was not felt to be a candidate for anticoagulation due to history of heavy Etoh abuse.     Past Medical History:  Diagnosis Date  . Cardiomyopathy secondary    likely related to HTN heart disease; possibly ETOH related as well  . Chronic combined systolic and diastolic heart failure (HCC)    Echocardiogram 09/22/11: Moderate LVH, EF 41-28%, grade 3 diastolic dysfunction, mild MR, moderate to severe LAE, mild RVE, mild to moderate TR, small to moderate pericardial effusion  . CKD (chronic kidney disease) stage 4, GFR 15-29 ml/min (HCC)    renal failure; due to hypertensive nephrosclerosis  . History of alcohol abuse   . Hypertension     Past Surgical History:  Procedure Laterality Date  . AV FISTULA PLACEMENT Left 05/14/2016   Procedure: LEFT ARM BASILIC VEIN TRANSPOSITION;  Surgeon: Rosetta Posner, MD;  Location: [place];  Service: Vascular;  Laterality: Left;  . PERIPHERAL VASCULAR  CATHETERIZATION N/A 05/13/2016   Procedure: Dialysis/Perma Catheter Insertion;  Surgeon: Serafina Mitchell, MD;  Location: [facility];  Service: Cardiovascular;  Laterality: N/A;    Current Medications: Facility-Administered Medications Prior to Visit  Medication Dose Route Frequency Provider Last Rate Last Dose  . acetaminophen (TYLENOL) tablet 650 mg  650 mg Oral Q6H PRN Rise Patience, MD   650 mg at 10/09/16 1327   Or  . acetaminophen (TYLENOL) suppository 650 mg  650 mg Rectal Q6H PRN Rise Patience, MD      . carvedilol (COREG) tablet 12.5 mg  12.5 mg Oral BID WC Roney Jaffe, MD   12.5 mg at 10/08/16 1642  . heparin injection 5,000 Units  5,000 Units Subcutaneous Q8H Rise Patience, MD   5,000 Units at 10/10/16 0600  . ondansetron (ZOFRAN) tablet 4 mg  4 mg Oral Q6H PRN Rise Patience, MD       Or  . ondansetron [facility]) injection 4 mg  4 mg Intravenous Q6H PRN Rise Patience, MD       Outpatient Medications Prior to Visit  Medication Sig Dispense Refill  . carvedilol (COREG) 12.5 MG tablet Take 1 tab by mouth twice daily on dialysis days. (Patient taking differently: Take 12.5 mg by mouth See admin instructions. Take 12.5 mg by mouth twice daily on dialysis days) 60 tablet 3  . carvedilol (COREG) 25 MG tablet Take 1 tablet by mouth twice daily on non dialysis days. (Patient taking differently: Take 25 mg by mouth See admin  instructions. Take 25 mg by mouth twice daily on non dialysis days.) 60 tablet 3     Allergies:   Patient has no known allergies.   Social History   Social History  . Marital status: Married    Spouse name: N/A  . Number of children: N/A  . Years of education: N/A   Social History Main Topics  . Smoking status: Former Smoker    Quit date: 08/15/2014  . Smokeless tobacco: Current User  . Alcohol use 1.8 oz/week    3 Cans of beer per week     Comment: occ  . Drug use: No  . Sexual activity: Not on file   Other Topics  Concern  . Not on file   Social History Narrative  . No narrative on file     Family History:  The patient's ***family history includes Cirrhosis in his father; Emphysema in his mother.   ROS:   Please see the history of present illness.    ROS All other systems reviewed and are negative.   PHYSICAL EXAM:   VS:  There were no vitals taken for this visit.   GEN: Well nourished, well developed, in no acute distress  HEENT: normal  Neck: no JVD, carotid bruits, or masses Cardiac: ***RRR; no murmurs, rubs, or gallops,no edema  Respiratory:  clear to auscultation bilaterally, normal work of breathing GI: soft, nontender, nondistended, + BS MS: no deformity or atrophy  Skin: warm and dry, no rash Neuro:  Alert and Oriented x 3, Strength and sensation are intact Psych: euthymic mood, full affect  Wt Readings from Last 3 Encounters:  10/09/16 173 lb 1 oz (78.5 kg)  08/15/16 175 lb (79.4 kg)  07/22/16 181 lb (82.1 kg)      Studies/Labs Reviewed:   EKG:  EKG is*** ordered today.  The ekg ordered today demonstrates ***  Recent Labs: 05/12/2016: B Natriuretic Peptide 2,191.8 10/07/2016: ALT 12 10/08/2016: Magnesium 2.2; TSH 1.391 10/09/2016: BUN 59; Creatinine, Ser 11.79; Hemoglobin 11.6; Platelets 216; Potassium 4.5; Sodium 135   Lipid Panel No results found for: CHOL, TRIG, HDL, CHOLHDL, VLDL, LDLCALC, LDLDIRECT  Additional studies/ records that were reviewed today include:  ***  .padscreen  ASSESSMENT:    No diagnosis found.   PLAN:  In order of problems listed above:  1. ***    Medication Adjustments/Labs and Tests Ordered: Current medicines are reviewed at length with the patient today.  Concerns regarding medicines are outlined above.  Medication changes, Labs and Tests ordered today are listed in the Patient Instructions below. There are no Patient Instructions on file for this visit.   Signed, Peter [name], MD  10/10/2016 7:24 AM    [facility] [place] [address], [place], [place], [address] [phone]

## 2016-10-10 NOTE — Evaluation (Addendum)
Physical Therapy Evaluation Patient Details Name: Kenneth [name] MRN: [id number] DOB: [birth year] Today's Date: 10/10/2016   History of Present Illness  The patient is a 60 y.o. year-old with history of HTN, etoh abuse and combined CHF, started HD in July 2017.  Was here in September with near syncope and NSVT, see below.  Rx'd with Coreg.  Now here d/t fall after HD.  Hit his head on the scale. Did not pass out.  Was seen in ED reportedly got frustrated and walked out of the ED. Then was found on the side of the road on [address] a couple of hours later and brought back to the ED.   Clinical Impression  Patient continues with some level of orthostatic hypotension, but able to tolerate standing 3 minutes for BP and walk in hallway without reported symptoms.  Feel symptoms improving, but planned IV bolus yet to be administered per RN due to awaiting IV access.  Should improve with another bolus and likely stable to d/c home with nephrology follow up for changing dry weight to prevent issues.  No further skilled PT needs at this time, RN to assist with mobility in hallway each shift.     Follow Up Recommendations No PT follow up    Equipment Recommendations  None recommended by PT    Recommendations for Other Services       Precautions / Restrictions Precautions Precautions: Fall (due to orthostasis)      Mobility  Bed Mobility Overal bed mobility: Modified Independent                Transfers Overall transfer level: Modified independent                  Ambulation/Gait Ambulation/Gait assistance: Supervision Ambulation Distance (Feet): 250 Feet Assistive device: None Gait Pattern/deviations: Step-through pattern;Drifts right/left;Wide base of support     General Gait Details: No LOB, one episode drifting to R in hallway, but able to demonstrate turning and environmental scanning without LOB  Stairs            Wheelchair Mobility    Modified Rankin (Stroke  Patients Only)       Balance Overall balance assessment: Needs assistance   Sitting balance-Leahy Scale: Normal       Standing balance-Leahy Scale: Good                               Pertinent Vitals/Pain Pain Assessment: No/denies pain                                  BP                Pulse Sitting                  135/70              78 Standing                95/63             90 Standing 3 min      90/57             95      Home Living Family/patient expects to be discharged to:: Private residence Living Arrangements: Children Available Help at Discharge: Family Type of Home: Apartment Home Access: Level entry  Home Layout: One level Home Equipment: Walker - 2 wheels;Cane - single point      Prior Function Level of Independence: Independent         Comments: reports lives with daughter who doesn't work, takes bus to dialysis     [employer]        Extremity/Trunk Assessment        Lower Extremity Assessment Lower Extremity Assessment: Overall WFL for tasks assessed       Communication   Communication: No difficulties  Cognition Arousal/Alertness: Awake/alert Behavior During Therapy: WFL for tasks assessed/performed Overall Cognitive Status: Within Functional Limits for tasks assessed                      General Comments      Exercises     Assessment/Plan    PT Assessment Patent does not need any further PT services  PT Problem List            PT Treatment Interventions      PT Goals (Current goals can be found in the Care Plan section)  Acute Rehab PT Goals PT Goal Formulation: All assessment and education complete, DC therapy    Frequency     Barriers to discharge        Co-evaluation               End of Session   Activity Tolerance: Patient tolerated treatment well Patient left: in bed;with call bell/phone within reach;with bed alarm set           Time: 1440-1459 PT Time  Calculation (min) (ACUTE ONLY): 19 min   Charges:   PT Evaluation $PT Eval Moderate Complexity: 1 Procedure     PT G CodesReginia Naas Oct 31, 2016, 3:54 PM  Magda Kiel, Sarah Ann 10-31-16

## 2016-10-10 NOTE — Progress Notes (Signed)
PROGRESS NOTE    Kenneth [name]  LFY:[id number] DOB: 26-Mar-1956 DOA: 10/07/2016 PCP: No PCP Per Patient  Brief Narrative:  60/M with ESRD, Cardiomyopathy EF of 25%, admitted after syncope post HD, 2 on episodes 12/12. Extensive cardiac workup 9/17   Assessment & Plan:  Syncope post HD/Orthostatic Hypotension - 2 episodes 12/12 -suspect due to volume status/fluid shifts -repeat Orthostatics today from 108->73 -Extensive cardiac workup 9/17 , Myoview with medium defect of moderate severity present in the basal inferior, mid inferior and apical inferior location, Findings consistent with prior myocardial infarction,  No significant ischemia, felt to be an intermediate risk study. No cath performed then -Appreciate Cards input, therapy limited by Hypotension and ESRD -on Coreg-dose lowered -repeat ECHO with improved EF of 55% and normal wall motion -TSH and Cortisol normal  Hypothermia  -due to hypotension and weather, on admission -resolved -TSH and cortisol WNL  ESRD on hemodialysis on Tuesday Thursday and Saturday  -see discussion above may give IVF again today  History of paroxysmal atrial fibrillation - chads 2 vasc score of 4.  - followed by cards, not felt to be a candidate for anticoagulation. - On Coreg -resolved  H/o EToh use -denies [place] at this time, ETOH level normal  Prolonged QTC -K and mag ok -QTC improved to 487  DVT prophylaxis: Heparin. Code Status: Full code.  Family Communication: Discussed with patient.  Disposition Plan: Home later today or in am  Consultants:   Cards  Renal   Subjective: Feels a little weak but ok, no dizziness  Objective: Vitals:   10/09/16 1849 10/09/16 2108 10/10/16 0632 10/10/16 1014  BP:  116/64 (!) 152/70   Pulse: 80 80 74   Resp: 18 18 18    Temp: 98.7 F (37.1 C) 98.3 F (36.8 C) 98.4 F (36.9 C) 97.8 F (36.6 C)  TempSrc: Oral Oral Oral Oral  SpO2:  98% 96% 96%  Weight:  78.5 kg (173 lb 1 oz)    Height:         Intake/Output Summary (Last 24 hours) at 10/10/16 1344 Last data filed at 10/10/16 0900  Gross per 24 hour  Intake              700 ml  Output               25 ml  Net              675 ml   Filed Weights   10/09/16 0730 10/09/16 1120 10/09/16 2108  Weight: 78.4 kg (172 lb 13.5 oz) 78.4 kg (172 lb 13.5 oz) 78.5 kg (173 lb 1 oz)    Examination:  General exam: Appears calm and comfortable, AAOx3 Respiratory system: Clear to auscultation. Respiratory effort normal. Cardiovascular system: S1 & S2 heard, RRR. No JVD, murmurs, rubs, gallops or clicks. No pedal edema. Gastrointestinal system: Abdomen is nondistended, soft and nontender. No organomegaly or masses felt. Normal bowel sounds heard. Central nervous system: Alert and oriented. No focal neurological deficits. Extremities: Symmetric 5 x 5 power. Skin: No rashes, lesions or ulcers Psychiatry: flat affect    Data Reviewed: I have personally reviewed following labs and imaging studies  CBC:  Recent Labs Lab 10/07/16 2210 10/08/16 0214 10/09/16 0347  WBC 10.8* 7.4 6.3  NEUTROABS 8.4*  --   --   HGB 14.5 13.0 11.6*  HCT 42.2 37.0* 34.0*  MCV 95.5 93.7 94.4  PLT 245 219 852   Basic Metabolic Panel:  Recent Labs Lab 10/07/16  2210 10/08/16 0214 10/08/16 1638 10/09/16 0347  NA 133* 135  --  135  K 5.8* 4.4  --  4.5  CL 92* 94*  --  93*  CO2 26 26  --  24  GLUCOSE 113* 136*  --  85  BUN 32* 37*  --  59*  CREATININE 8.78* 8.91*  --  11.79*  CALCIUM 9.2 8.7*  --  8.7*  MG  --   --  2.2  --    GFR: Estimated Creatinine Clearance: 7.1 mL/min (by C-G formula based on SCr of 11.79 mg/dL (H)). Liver Function Tests:  Recent Labs Lab 10/07/16 2210  AST 21  ALT 12*  ALKPHOS 71  BILITOT 1.0  PROT 10.4*  ALBUMIN 4.1   No results for input(s): LIPASE, AMYLASE in the last 168 hours.  Recent Labs Lab 10/07/16 2210  AMMONIA 23   Coagulation Profile:  Recent Labs Lab 10/07/16 2210  INR 1.01    Cardiac Enzymes:  Recent Labs Lab 10/08/16 0214 10/08/16 0921 10/08/16 1233  TROPONINI 0.03* 0.04* 0.03*   BNP (last 3 results) No results for input(s): PROBNP in the last 8760 hours. HbA1C: No results for input(s): HGBA1C in the last 72 hours. CBG: No results for input(s): GLUCAP in the last 168 hours. Lipid Profile: No results for input(s): CHOL, HDL, LDLCALC, TRIG, CHOLHDL, LDLDIRECT in the last 72 hours. Thyroid Function Tests:  Recent Labs  10/08/16 0214  TSH 1.391   Anemia Panel: No results for input(s): VITAMINB12, FOLATE, FERRITIN, TIBC, IRON, RETICCTPCT in the last 72 hours. Urine analysis:    Component Value Date/Time   COLORURINE YELLOW 05/12/2016 0834   APPEARANCEUR CLOUDY (A) 05/12/2016 0834   LABSPEC 1.014 05/12/2016 0834   PHURINE 5.0 05/12/2016 0834   GLUCOSEU NEGATIVE 05/12/2016 0834   HGBUR MODERATE (A) 05/12/2016 0834   BILIRUBINUR NEGATIVE 05/12/2016 0834   KETONESUR NEGATIVE 05/12/2016 0834   PROTEINUR 100 (A) 05/12/2016 0834   UROBILINOGEN 0.2 12/09/2013 0013   NITRITE NEGATIVE 05/12/2016 0834   LEUKOCYTESUR NEGATIVE 05/12/2016 0834   Sepsis Labs: @LABRCNTIP (procalcitonin:4,lacticidven:4)  ) Recent Results (from the past 240 hour(s))  MRSA PCR Screening     Status: None   Collection Time: 10/09/16  5:30 PM  Result Value Ref Range Status   MRSA by PCR NEGATIVE NEGATIVE Final    Comment:        The GeneXpert MRSA Assay (FDA approved for NASAL specimens only), is one component of a comprehensive MRSA colonization surveillance program. It is not intended to diagnose MRSA infection nor to guide or monitor treatment for MRSA infections.          Radiology Studies: No results found.      Scheduled Meds: . carvedilol  6.25 mg Oral BID WC  . heparin  5,000 Units Subcutaneous Q8H  . sodium chloride  500 mL Intravenous Once   Continuous Infusions:   LOS: 2 days    Time spent: 2min    Domenic Polite, MD [facility] Pager [phone]  If 7PM-7AM, please contact night-coverage [email or web address] Password [email or web address] 10/10/2016, 1:44 PM

## 2016-10-11 LAB — RENAL FUNCTION PANEL
ALBUMIN: 2.8 g/dL — AB (ref 3.5–5.0)
Anion gap: 13 (ref 5–15)
BUN: 58 mg/dL — AB (ref 6–20)
CHLORIDE: 99 mmol/L — AB (ref 101–111)
CO2: 24 mmol/L (ref 22–32)
CREATININE: 11.36 mg/dL — AB (ref 0.61–1.24)
Calcium: 8.4 mg/dL — ABNORMAL LOW (ref 8.9–10.3)
GFR calc Af Amer: 5 mL/min — ABNORMAL LOW (ref >60)
GFR, EST NON AFRICAN AMERICAN: 4 mL/min — AB (ref >60)
GLUCOSE: 107 mg/dL — AB (ref 65–99)
Phosphorus: 5.6 mg/dL — ABNORMAL HIGH (ref 2.5–4.6)
Potassium: 4.1 mmol/L (ref 3.5–5.1)
Sodium: 136 mmol/L (ref 135–145)

## 2016-10-11 LAB — CBC
HCT: 27.6 % — ABNORMAL LOW (ref 39.0–52.0)
Hemoglobin: 9.5 g/dL — ABNORMAL LOW (ref 13.0–17.0)
MCH: 32.3 pg (ref 26.0–34.0)
MCHC: 34.4 g/dL (ref 30.0–36.0)
MCV: 93.9 fL (ref 78.0–100.0)
PLATELETS: 185 10*3/uL (ref 150–400)
RBC: 2.94 MIL/uL — ABNORMAL LOW (ref 4.22–5.81)
RDW: 15.4 % (ref 11.5–15.5)
WBC: 5.9 10*3/uL (ref 4.0–10.5)

## 2016-10-11 MED ORDER — LIDOCAINE-PRILOCAINE 2.5-2.5 % EX CREA: 1.0000 "application " | TOPICAL_CREAM | CUTANEOUS | Status: DC | PRN

## 2016-10-11 MED ORDER — HEPARIN SODIUM (PORCINE) 1000 UNIT/ML DIALYSIS: 20.0000 [IU]/kg | INTRAMUSCULAR | Status: DC | PRN

## 2016-10-11 MED ORDER — SODIUM CHLORIDE 0.9 % IV SOLN: 100.0000 mL | INTRAVENOUS | Status: DC | PRN

## 2016-10-11 MED ORDER — HEPARIN SODIUM (PORCINE) 1000 UNIT/ML DIALYSIS: 1000.0000 [IU] | INTRAMUSCULAR | Status: DC | PRN

## 2016-10-11 MED ORDER — LIDOCAINE HCL (PF) 1 % IJ SOLN: 5.0000 mL | INTRAMUSCULAR | Status: DC | PRN

## 2016-10-11 MED ORDER — ACETAMINOPHEN 325 MG PO TABS: 650.0000 mg | ORAL_TABLET | Freq: Four times a day (QID) | ORAL | Status: DC | PRN

## 2016-10-11 MED ORDER — PENTAFLUOROPROP-TETRAFLUOROETH EX AERO: 1.0000 "application " | INHALATION_SPRAY | CUTANEOUS | Status: DC | PRN

## 2016-10-11 NOTE — Progress Notes (Signed)
[facility] Progress Note   Subjective: Minimally responsive to questions-yes/no answers. Seen on HD, running even. BP 156/85. No C/Os. SR with frequent PVCs on monitor.    Objective Vitals:   10/11/16 0900 10/11/16 0930 10/11/16 1000 10/11/16 1030  BP: 139/64 (!) 150/70 (!) 156/84 (!) 156/85  Pulse: 62 (!) 52 65 66  Resp:      Temp:      TempSrc:      SpO2:      Weight:      Height:       Physical Exam General: Chronically ill appearing male in NAD Heart: RRR. SR on monitor with freq PVCs.  Lungs: CTAB A/P Abdomen: Active BS non-tender Extremities: No LE edema.  Dialysis Access: LUA AVF cannulated at present. RIJ TDC drsg CDI.   Additional Objective Labs: Basic Metabolic Panel:  Recent Labs Lab 10/08/16 0214 10/09/16 0347 10/11/16 0720  NA 135 135 136  K 4.4 4.5 4.1  CL 94* 93* 99*  CO2 26 24 24   GLUCOSE 136* 85 107*  BUN 37* 59* 58*  CREATININE 8.91* 11.79* 11.36*  CALCIUM 8.7* 8.7* 8.4*  PHOS  --   --  5.6*   Liver Function Tests:  Recent Labs Lab 10/07/16 2210 10/11/16 0720  AST 21  --   ALT 12*  --   ALKPHOS 71  --   BILITOT 1.0  --   PROT 10.4*  --   ALBUMIN 4.1 2.8*   No results for input(s): LIPASE, AMYLASE in the last 168 hours. CBC:  Recent Labs Lab 10/07/16 2210 10/08/16 0214 10/09/16 0347 10/11/16 0720  WBC 10.8* 7.4 6.3 5.9  NEUTROABS 8.4*  --   --   --   HGB 14.5 13.0 11.6* 9.5*  HCT 42.2 37.0* 34.0* 27.6*  MCV 95.5 93.7 94.4 93.9  PLT 245 219 216 185   Blood Culture    Component Value Date/Time   SDES URINE, CLEAN CATCH 05/12/2016 0824   SPECREQUEST NONE 05/12/2016 0824   CULT (A) 05/12/2016 0824    7,000 COLONIES/mL INSIGNIFICANT GROWTH Performed at [facility] 05/13/2016 FINAL 05/12/2016 0824    Cardiac Enzymes:  Recent Labs Lab 10/08/16 0214 10/08/16 0921 10/08/16 1233  TROPONINI 0.03* 0.04* 0.03*   CBG: No results for input(s): GLUCAP in the last 168 hours. Iron  Studies: No results for input(s): IRON, TIBC, TRANSFERRIN, FERRITIN in the last 72 hours. @lablastinr3 @ Studies/Results: No results found. Medications:  . heparin  5,000 Units Subcutaneous Q8H  . sodium chloride  500 mL Intravenous Once    Dialysis Orders: TTS at [facility],4h 82min 77.5kg, 2/2 bath, Hep 2400, R IJ TDC/ LUA AVF - using AVF V- 50mg / hd C- 1.5ug tiw No esa  Assessment/Plan: 1. Syncope (suspected): Orthostatic hypotension observed here, 1L NS given 12/14, Decreased orthostasis this AM.Letting volume come up ,suspect he is gaining body weight.   2. ESRD: Continue HD per TTS schedule, just started HD in 04/2016. Using LUA AVF. K+ 4.1 switched to 3.0 K bath.  3. Hx cardiomyopathy (EF 30% per nuclear stress test in 06/2016). Repeat Echo 12/14 showed normalized LVEF 55-60%, no WMAs. DC'd coreg w syncope/ orthostasis 4. BP/volume:Orthostatic BPs on adm-seem to be improving, ,allowing weight to come up. Coreg held. UFG 500 today. BP stable.  5. Hx EToH abuse - no signs of intoxication or withdrawal. 6. Anemia: Hgb 9.5 down from 11.6 10/12/16. Recheck in AM. No overt signs of blood loss. Check stool for  FOB.  7. MBD: Ca 8.4 Phos 5.6.  8  Hx PAF: in NSR now. Not on anticoagulation.   Rita H. Brown NP-C 10/11/2016, 11:05 AM  [facility] [phone]  Pt seen, examined and agree w A/P as above.  Kelly Splinter MD [employer] pager [phone]   10/11/2016, 1:44 PM

## 2016-10-11 NOTE — Progress Notes (Signed)
Patient discharged home after hemodialysis. VSS. NSL discontinued with catheter intact. Discharge instructions reviewed with patient. Reviewed discharge instructions with daughter via telephone. Patient opted to ambulate to daughter's car escorted by this RN. Bartholomew Crews, RN

## 2016-10-13 ENCOUNTER — Ambulatory Visit: Payer: Medicaid Other | Admitting: Cardiology

## 2016-10-13 NOTE — Discharge Summary (Signed)
Physician Discharge Summary  Belmont Valli XFG:[id number] DOB: [birth year] DOA: 10/07/2016  PCP: No PCP Per Patient  Admit date: 10/07/2016 Discharge date: 10/11/2016  Time spent: 35 minutes  Recommendations for Outpatient Follow-up:  PCP in 1 week, dry weight increased   Discharge Diagnoses:  Principal Problem:   Near syncope   Orthostatic hypotension   Essential hypertension   Syncope   Hypothermia   ESRD on hemodialysis [facility])   Fall   Discharge Condition: stable  Diet recommendation: Renal  Filed Weights   10/10/16 2100 10/11/16 0718 10/11/16 1133  Weight: 78.4 kg (172 lb 13.5 oz) 80.6 kg (177 lb 11.1 oz) 80.1 kg (176 lb 9.4 oz)    History of present illness:   60/M with ESRD, Cardiomyopathy EF of 25%, admitted after syncope post HD, 2 on episodes 12/12. Had extensive cardiac workup 9/17   Hospital Course:  Syncope post HD/Orthostatic Hypotension - 2 episodes 12/12 -suspect due to volume status/fluid shifts with HD -Extensive cardiac workup 9/17 , EF 25% Myoview with medium defect of moderate severity present in the basal inferior, mid inferior and apical inferior location, Findings consistent with prior myocardial infarction, No significant ischemia, felt to be an intermediate risk study. No cath performed then -on Coreg-continued-repeat ECHO with improved EF of 55% and normal wall motion, seen by Cards this admission, no further cardiac workup recommended at his time -TSH and Cortisol normal -Orthostatic BP drops improved with fluid and increasing the dry weight at HD  Hypothermia -due to hypotension and weather, on admission -resolved -TSH and cortisol WNL  ESRDon hemodialysis on Tuesday Thursday and Saturday  -given IVF this admission due to above  History of paroxysmal atrial fibrillation- chads 2 vasc score of 4.  - followed by cards as outpatient, not felt to be a candidate for anticoagulation. - On Coreg -resolved  H/o EToh use -denies [place] at  this time, ETOH level normal  Prolonged QTC -K and mag ok -QTC improved to 487  Consultations:  Renal  Discharge Exam: Vitals:   10/11/16 1133 10/11/16 1227  BP: (!) 158/82 (!) 143/76  Pulse: 66 71  Resp: 14 16  Temp: 98.6 F (37 C) 98.4 F (36.9 C)    General: AAOx3 Cardiovascular: S1S2/RRR Respiratory: CTAB  Discharge Instructions   Discharge Instructions    Discharge instructions    Complete by:  As directed    Renal diet   Increase activity slowly    Complete by:  As directed      Discharge Medication List as of 10/11/2016  3:19 PM    START taking these medications   Details  acetaminophen (TYLENOL) 325 MG tablet Take 2 tablets (650 mg total) by mouth every 6 (six) hours as needed for mild pain (or Fever >/= 101)., Starting Sat 10/11/2016, OTC      CONTINUE these medications which have NOT CHANGED   Details  carvedilol (COREG) 12.5 MG tablet Take 1 tab by mouth twice daily on dialysis days., Normal       No Known Allergies Follow-up Information    PCP. Schedule an appointment as soon as possible for a visit in 1 week(s).            The results of significant diagnostics from this hospitalization (including imaging, microbiology, ancillary and laboratory) are listed below for reference.    Significant Diagnostic Studies: Dg Chest 2 View  Result Date: 10/07/2016 CLINICAL DATA:  Altered mental status. EXAM: CHEST  2 VIEW COMPARISON:  Chest radiograph 07/05/2016 FINDINGS:  Right internal jugular approach dialysis catheter tip overlies the right atrium. There is mild aortic arch atherosclerotic calcification. Cardiomediastinal contours are otherwise normal. No focal airspace consolidation or pulmonary edema. No pneumothorax or pleural effusion. IMPRESSION: No active cardiopulmonary disease. Aortic atherosclerosis. Electronically Signed   By: Ulyses Jarred M.D.   On: 10/07/2016 21:24   Ct Head Wo Contrast  Result Date: 10/07/2016 CLINICAL DATA:   Found down hit head EXAM: CT HEAD WITHOUT CONTRAST TECHNIQUE: Contiguous axial images were obtained from the base of the skull through the vertex without intravenous contrast. COMPARISON:  05/15/2016 FINDINGS: Brain: No acute territorial infarction or intracranial hemorrhage is visualized. There is moderate atrophy. Moderate periventricular white matter hypodensities consistent with small vessel disease. Old right basal ganglial lacunar infarct. No mass or midline shift. Vascular: No hyperdense vessels. Carotid artery calcifications. Vertebral artery calcifications. Skull: Mastoid air cells are clear.  There is no fracture. Sinuses/Orbits: Mild mucosal thickening within the ethmoid sinuses. No acute orbital abnormality. Other: None IMPRESSION: 1. No CT evidence for acute intracranial abnormality 2. Moderate atrophy. Moderate subcortical and periventricular white matter small vessel changes. Old right ganglial capsular lacunar infarct Electronically Signed   By: Donavan Foil M.D.   On: 10/07/2016 22:27    Microbiology: Recent Results (from the past 240 hour(s))  MRSA PCR Screening     Status: None   Collection Time: 10/09/16  5:30 PM  Result Value Ref Range Status   MRSA by PCR NEGATIVE NEGATIVE Final    Comment:        The GeneXpert MRSA Assay (FDA approved for NASAL specimens only), is one component of a comprehensive MRSA colonization surveillance program. It is not intended to diagnose MRSA infection nor to guide or monitor treatment for MRSA infections.      Labs: Basic Metabolic Panel:  Recent Labs Lab 10/07/16 2210 10/08/16 0214 10/08/16 1638 10/09/16 0347 10/11/16 0720  NA 133* 135  --  135 136  K 5.8* 4.4  --  4.5 4.1  CL 92* 94*  --  93* 99*  CO2 26 26  --  24 24  GLUCOSE 113* 136*  --  85 107*  BUN 32* 37*  --  59* 58*  CREATININE 8.78* 8.91*  --  11.79* 11.36*  CALCIUM 9.2 8.7*  --  8.7* 8.4*  MG  --   --  2.2  --   --   PHOS  --   --   --   --  5.6*   Liver  Function Tests:  Recent Labs Lab 10/07/16 2210 10/11/16 0720  AST 21  --   ALT 12*  --   ALKPHOS 71  --   BILITOT 1.0  --   PROT 10.4*  --   ALBUMIN 4.1 2.8*   No results for input(s): LIPASE, AMYLASE in the last 168 hours.  Recent Labs Lab 10/07/16 2210  AMMONIA 23   CBC:  Recent Labs Lab 10/07/16 2210 10/08/16 0214 10/09/16 0347 10/11/16 0720  WBC 10.8* 7.4 6.3 5.9  NEUTROABS 8.4*  --   --   --   HGB 14.5 13.0 11.6* 9.5*  HCT 42.2 37.0* 34.0* 27.6*  MCV 95.5 93.7 94.4 93.9  PLT 245 219 216 185   Cardiac Enzymes:  Recent Labs Lab 10/08/16 0214 10/08/16 0921 10/08/16 1233  TROPONINI 0.03* 0.04* 0.03*   BNP: BNP (last 3 results)  Recent Labs  05/12/16 0736  BNP 2,191.8*    ProBNP (last 3 results) No results for  input(s): PROBNP in the last 8760 hours.  CBG: No results for input(s): GLUCAP in the last 168 hours.     SignedDomenic Polite MD.  [facility] 10/13/2016, 3:36 PM

## 2016-10-17 ENCOUNTER — Telehealth: Payer: Self-pay | Admitting: Cardiology

## 2016-10-17 NOTE — Telephone Encounter (Signed)
Returned call to daughter (ok per DPR)-Reports patient was recently in the hospital d/t syncope d/t orthostatic hypotension and coreg was decreased (D/C on 12/16).    Daughter states that Tuesday patients Nephrologist discontinued his Coreg d/t continued hypotension.  Hypotension has resolved at dialysis since the discontinuation of Coreg but daughter would like to let MD know that it was discontinued and if patient needs to be started on a different medication.    Advised I would route to MD for recommendations.  Verbalized understanding.

## 2016-10-17 NOTE — Telephone Encounter (Signed)
New message  Pt's wife is calling in regards to carvedilol 12.5mg  1 tab 2x daily  Kidney doctor took pt off of carvedilol due to BP readings were very low  Pt needs to know if there is any medication he needs to be on  Please call back and advise

## 2016-10-19 NOTE — Telephone Encounter (Signed)
OK to stop Coreg due to hypotension. No additional medication at this time  Peter [name] MD, [facility]

## 2016-10-21 NOTE — Telephone Encounter (Signed)
[name] AWARE TO STOP CARVEDILOL. VERBALIZE UNDERSTANDING   DISCONTINUE CARVEDILOL  OFF CURRENT MEDICATION LIST

## 2016-10-28 DIAGNOSIS — R7989 Other specified abnormal findings of blood chemistry: Secondary | ICD-10-CM | POA: Diagnosis not present

## 2016-10-28 DIAGNOSIS — N186 End stage renal disease: Secondary | ICD-10-CM | POA: Diagnosis not present

## 2016-10-28 DIAGNOSIS — N2581 Secondary hyperparathyroidism of renal origin: Secondary | ICD-10-CM | POA: Diagnosis not present

## 2016-10-28 DIAGNOSIS — D509 Iron deficiency anemia, unspecified: Secondary | ICD-10-CM | POA: Diagnosis not present

## 2016-10-28 DIAGNOSIS — D631 Anemia in chronic kidney disease: Secondary | ICD-10-CM | POA: Diagnosis not present

## 2016-10-30 DIAGNOSIS — D631 Anemia in chronic kidney disease: Secondary | ICD-10-CM | POA: Diagnosis not present

## 2016-10-30 DIAGNOSIS — N186 End stage renal disease: Secondary | ICD-10-CM | POA: Diagnosis not present

## 2016-10-30 DIAGNOSIS — D509 Iron deficiency anemia, unspecified: Secondary | ICD-10-CM | POA: Diagnosis not present

## 2016-10-30 DIAGNOSIS — N2581 Secondary hyperparathyroidism of renal origin: Secondary | ICD-10-CM | POA: Diagnosis not present

## 2016-10-30 DIAGNOSIS — R7989 Other specified abnormal findings of blood chemistry: Secondary | ICD-10-CM | POA: Diagnosis not present

## 2016-11-01 DIAGNOSIS — R7989 Other specified abnormal findings of blood chemistry: Secondary | ICD-10-CM | POA: Diagnosis not present

## 2016-11-01 DIAGNOSIS — N186 End stage renal disease: Secondary | ICD-10-CM | POA: Diagnosis not present

## 2016-11-01 DIAGNOSIS — D631 Anemia in chronic kidney disease: Secondary | ICD-10-CM | POA: Diagnosis not present

## 2016-11-01 DIAGNOSIS — D509 Iron deficiency anemia, unspecified: Secondary | ICD-10-CM | POA: Diagnosis not present

## 2016-11-01 DIAGNOSIS — N2581 Secondary hyperparathyroidism of renal origin: Secondary | ICD-10-CM | POA: Diagnosis not present

## 2016-11-06 DIAGNOSIS — N186 End stage renal disease: Secondary | ICD-10-CM | POA: Diagnosis not present

## 2016-11-06 DIAGNOSIS — D509 Iron deficiency anemia, unspecified: Secondary | ICD-10-CM | POA: Diagnosis not present

## 2016-11-06 DIAGNOSIS — N2581 Secondary hyperparathyroidism of renal origin: Secondary | ICD-10-CM | POA: Diagnosis not present

## 2016-11-06 DIAGNOSIS — R7989 Other specified abnormal findings of blood chemistry: Secondary | ICD-10-CM | POA: Diagnosis not present

## 2016-11-06 DIAGNOSIS — D631 Anemia in chronic kidney disease: Secondary | ICD-10-CM | POA: Diagnosis not present

## 2016-11-08 DIAGNOSIS — R7989 Other specified abnormal findings of blood chemistry: Secondary | ICD-10-CM | POA: Diagnosis not present

## 2016-11-08 DIAGNOSIS — N186 End stage renal disease: Secondary | ICD-10-CM | POA: Diagnosis not present

## 2016-11-08 DIAGNOSIS — D631 Anemia in chronic kidney disease: Secondary | ICD-10-CM | POA: Diagnosis not present

## 2016-11-08 DIAGNOSIS — D509 Iron deficiency anemia, unspecified: Secondary | ICD-10-CM | POA: Diagnosis not present

## 2016-11-08 DIAGNOSIS — N2581 Secondary hyperparathyroidism of renal origin: Secondary | ICD-10-CM | POA: Diagnosis not present

## 2016-11-13 DIAGNOSIS — R7989 Other specified abnormal findings of blood chemistry: Secondary | ICD-10-CM | POA: Diagnosis not present

## 2016-11-13 DIAGNOSIS — N2581 Secondary hyperparathyroidism of renal origin: Secondary | ICD-10-CM | POA: Diagnosis not present

## 2016-11-13 DIAGNOSIS — D509 Iron deficiency anemia, unspecified: Secondary | ICD-10-CM | POA: Diagnosis not present

## 2016-11-13 DIAGNOSIS — D631 Anemia in chronic kidney disease: Secondary | ICD-10-CM | POA: Diagnosis not present

## 2016-11-13 DIAGNOSIS — N186 End stage renal disease: Secondary | ICD-10-CM | POA: Diagnosis not present

## 2016-11-15 DIAGNOSIS — D509 Iron deficiency anemia, unspecified: Secondary | ICD-10-CM | POA: Diagnosis not present

## 2016-11-15 DIAGNOSIS — R7989 Other specified abnormal findings of blood chemistry: Secondary | ICD-10-CM | POA: Diagnosis not present

## 2016-11-15 DIAGNOSIS — D631 Anemia in chronic kidney disease: Secondary | ICD-10-CM | POA: Diagnosis not present

## 2016-11-15 DIAGNOSIS — N186 End stage renal disease: Secondary | ICD-10-CM | POA: Diagnosis not present

## 2016-11-15 DIAGNOSIS — N2581 Secondary hyperparathyroidism of renal origin: Secondary | ICD-10-CM | POA: Diagnosis not present

## 2016-11-18 DIAGNOSIS — N186 End stage renal disease: Secondary | ICD-10-CM | POA: Diagnosis not present

## 2016-11-18 DIAGNOSIS — R7989 Other specified abnormal findings of blood chemistry: Secondary | ICD-10-CM | POA: Diagnosis not present

## 2016-11-18 DIAGNOSIS — D631 Anemia in chronic kidney disease: Secondary | ICD-10-CM | POA: Diagnosis not present

## 2016-11-18 DIAGNOSIS — D509 Iron deficiency anemia, unspecified: Secondary | ICD-10-CM | POA: Diagnosis not present

## 2016-11-18 DIAGNOSIS — N2581 Secondary hyperparathyroidism of renal origin: Secondary | ICD-10-CM | POA: Diagnosis not present

## 2016-11-20 DIAGNOSIS — R7989 Other specified abnormal findings of blood chemistry: Secondary | ICD-10-CM | POA: Diagnosis not present

## 2016-11-20 DIAGNOSIS — N186 End stage renal disease: Secondary | ICD-10-CM | POA: Diagnosis not present

## 2016-11-20 DIAGNOSIS — D631 Anemia in chronic kidney disease: Secondary | ICD-10-CM | POA: Diagnosis not present

## 2016-11-20 DIAGNOSIS — D509 Iron deficiency anemia, unspecified: Secondary | ICD-10-CM | POA: Diagnosis not present

## 2016-11-20 DIAGNOSIS — N2581 Secondary hyperparathyroidism of renal origin: Secondary | ICD-10-CM | POA: Diagnosis not present

## 2016-11-21 ENCOUNTER — Telehealth (HOSPITAL_COMMUNITY): Payer: Self-pay | Admitting: Radiology

## 2016-11-21 NOTE — Telephone Encounter (Signed)
Patient does not echocardiogram per Dr. [name] but does need to keep his appointment with Dr. [name]

## 2016-11-22 DIAGNOSIS — N2581 Secondary hyperparathyroidism of renal origin: Secondary | ICD-10-CM | POA: Diagnosis not present

## 2016-11-22 DIAGNOSIS — R7989 Other specified abnormal findings of blood chemistry: Secondary | ICD-10-CM | POA: Diagnosis not present

## 2016-11-22 DIAGNOSIS — D631 Anemia in chronic kidney disease: Secondary | ICD-10-CM | POA: Diagnosis not present

## 2016-11-22 DIAGNOSIS — D509 Iron deficiency anemia, unspecified: Secondary | ICD-10-CM | POA: Diagnosis not present

## 2016-11-22 DIAGNOSIS — N186 End stage renal disease: Secondary | ICD-10-CM | POA: Diagnosis not present

## 2016-11-25 DIAGNOSIS — D631 Anemia in chronic kidney disease: Secondary | ICD-10-CM | POA: Diagnosis not present

## 2016-11-25 DIAGNOSIS — D509 Iron deficiency anemia, unspecified: Secondary | ICD-10-CM | POA: Diagnosis not present

## 2016-11-25 DIAGNOSIS — N2581 Secondary hyperparathyroidism of renal origin: Secondary | ICD-10-CM | POA: Diagnosis not present

## 2016-11-25 DIAGNOSIS — N186 End stage renal disease: Secondary | ICD-10-CM | POA: Diagnosis not present

## 2016-11-25 DIAGNOSIS — R7989 Other specified abnormal findings of blood chemistry: Secondary | ICD-10-CM | POA: Diagnosis not present

## 2016-11-26 DIAGNOSIS — I129 Hypertensive chronic kidney disease with stage 1 through stage 4 chronic kidney disease, or unspecified chronic kidney disease: Secondary | ICD-10-CM | POA: Diagnosis not present

## 2016-11-26 DIAGNOSIS — N186 End stage renal disease: Secondary | ICD-10-CM | POA: Diagnosis not present

## 2016-11-26 DIAGNOSIS — Z992 Dependence on renal dialysis: Secondary | ICD-10-CM | POA: Diagnosis not present

## 2016-11-27 DIAGNOSIS — Z23 Encounter for immunization: Secondary | ICD-10-CM | POA: Diagnosis not present

## 2016-11-27 DIAGNOSIS — D509 Iron deficiency anemia, unspecified: Secondary | ICD-10-CM | POA: Diagnosis not present

## 2016-11-27 DIAGNOSIS — D631 Anemia in chronic kidney disease: Secondary | ICD-10-CM | POA: Diagnosis not present

## 2016-11-27 DIAGNOSIS — N2581 Secondary hyperparathyroidism of renal origin: Secondary | ICD-10-CM | POA: Diagnosis not present

## 2016-11-27 DIAGNOSIS — N186 End stage renal disease: Secondary | ICD-10-CM | POA: Diagnosis not present

## 2016-11-28 ENCOUNTER — Ambulatory Visit: Payer: Medicaid Other | Admitting: Cardiology

## 2016-11-29 DIAGNOSIS — D509 Iron deficiency anemia, unspecified: Secondary | ICD-10-CM | POA: Diagnosis not present

## 2016-11-29 DIAGNOSIS — N2581 Secondary hyperparathyroidism of renal origin: Secondary | ICD-10-CM | POA: Diagnosis not present

## 2016-11-29 DIAGNOSIS — N186 End stage renal disease: Secondary | ICD-10-CM | POA: Diagnosis not present

## 2016-11-29 DIAGNOSIS — D631 Anemia in chronic kidney disease: Secondary | ICD-10-CM | POA: Diagnosis not present

## 2016-11-29 DIAGNOSIS — Z23 Encounter for immunization: Secondary | ICD-10-CM | POA: Diagnosis not present

## 2016-12-02 DIAGNOSIS — D631 Anemia in chronic kidney disease: Secondary | ICD-10-CM | POA: Diagnosis not present

## 2016-12-02 DIAGNOSIS — Z23 Encounter for immunization: Secondary | ICD-10-CM | POA: Diagnosis not present

## 2016-12-02 DIAGNOSIS — N2581 Secondary hyperparathyroidism of renal origin: Secondary | ICD-10-CM | POA: Diagnosis not present

## 2016-12-02 DIAGNOSIS — D509 Iron deficiency anemia, unspecified: Secondary | ICD-10-CM | POA: Diagnosis not present

## 2016-12-02 DIAGNOSIS — N186 End stage renal disease: Secondary | ICD-10-CM | POA: Diagnosis not present

## 2016-12-04 DIAGNOSIS — D631 Anemia in chronic kidney disease: Secondary | ICD-10-CM | POA: Diagnosis not present

## 2016-12-04 DIAGNOSIS — Z23 Encounter for immunization: Secondary | ICD-10-CM | POA: Diagnosis not present

## 2016-12-04 DIAGNOSIS — N2581 Secondary hyperparathyroidism of renal origin: Secondary | ICD-10-CM | POA: Diagnosis not present

## 2016-12-04 DIAGNOSIS — N186 End stage renal disease: Secondary | ICD-10-CM | POA: Diagnosis not present

## 2016-12-04 DIAGNOSIS — D509 Iron deficiency anemia, unspecified: Secondary | ICD-10-CM | POA: Diagnosis not present

## 2016-12-05 ENCOUNTER — Other Ambulatory Visit (HOSPITAL_COMMUNITY): Payer: Medicaid Other

## 2016-12-06 DIAGNOSIS — N186 End stage renal disease: Secondary | ICD-10-CM | POA: Diagnosis not present

## 2016-12-06 DIAGNOSIS — Z23 Encounter for immunization: Secondary | ICD-10-CM | POA: Diagnosis not present

## 2016-12-06 DIAGNOSIS — N2581 Secondary hyperparathyroidism of renal origin: Secondary | ICD-10-CM | POA: Diagnosis not present

## 2016-12-06 DIAGNOSIS — D509 Iron deficiency anemia, unspecified: Secondary | ICD-10-CM | POA: Diagnosis not present

## 2016-12-06 DIAGNOSIS — D631 Anemia in chronic kidney disease: Secondary | ICD-10-CM | POA: Diagnosis not present

## 2016-12-09 DIAGNOSIS — D631 Anemia in chronic kidney disease: Secondary | ICD-10-CM | POA: Diagnosis not present

## 2016-12-09 DIAGNOSIS — D509 Iron deficiency anemia, unspecified: Secondary | ICD-10-CM | POA: Diagnosis not present

## 2016-12-09 DIAGNOSIS — N186 End stage renal disease: Secondary | ICD-10-CM | POA: Diagnosis not present

## 2016-12-09 DIAGNOSIS — Z23 Encounter for immunization: Secondary | ICD-10-CM | POA: Diagnosis not present

## 2016-12-09 DIAGNOSIS — N2581 Secondary hyperparathyroidism of renal origin: Secondary | ICD-10-CM | POA: Diagnosis not present

## 2016-12-11 DIAGNOSIS — Z23 Encounter for immunization: Secondary | ICD-10-CM | POA: Diagnosis not present

## 2016-12-11 DIAGNOSIS — D509 Iron deficiency anemia, unspecified: Secondary | ICD-10-CM | POA: Diagnosis not present

## 2016-12-11 DIAGNOSIS — N186 End stage renal disease: Secondary | ICD-10-CM | POA: Diagnosis not present

## 2016-12-11 DIAGNOSIS — N2581 Secondary hyperparathyroidism of renal origin: Secondary | ICD-10-CM | POA: Diagnosis not present

## 2016-12-11 DIAGNOSIS — D631 Anemia in chronic kidney disease: Secondary | ICD-10-CM | POA: Diagnosis not present

## 2016-12-13 DIAGNOSIS — D509 Iron deficiency anemia, unspecified: Secondary | ICD-10-CM | POA: Diagnosis not present

## 2016-12-13 DIAGNOSIS — N186 End stage renal disease: Secondary | ICD-10-CM | POA: Diagnosis not present

## 2016-12-13 DIAGNOSIS — N2581 Secondary hyperparathyroidism of renal origin: Secondary | ICD-10-CM | POA: Diagnosis not present

## 2016-12-13 DIAGNOSIS — D631 Anemia in chronic kidney disease: Secondary | ICD-10-CM | POA: Diagnosis not present

## 2016-12-13 DIAGNOSIS — Z23 Encounter for immunization: Secondary | ICD-10-CM | POA: Diagnosis not present

## 2016-12-16 DIAGNOSIS — Z23 Encounter for immunization: Secondary | ICD-10-CM | POA: Diagnosis not present

## 2016-12-16 DIAGNOSIS — N186 End stage renal disease: Secondary | ICD-10-CM | POA: Diagnosis not present

## 2016-12-16 DIAGNOSIS — D631 Anemia in chronic kidney disease: Secondary | ICD-10-CM | POA: Diagnosis not present

## 2016-12-16 DIAGNOSIS — D509 Iron deficiency anemia, unspecified: Secondary | ICD-10-CM | POA: Diagnosis not present

## 2016-12-16 DIAGNOSIS — N2581 Secondary hyperparathyroidism of renal origin: Secondary | ICD-10-CM | POA: Diagnosis not present

## 2016-12-20 DIAGNOSIS — Z23 Encounter for immunization: Secondary | ICD-10-CM | POA: Diagnosis not present

## 2016-12-20 DIAGNOSIS — N186 End stage renal disease: Secondary | ICD-10-CM | POA: Diagnosis not present

## 2016-12-20 DIAGNOSIS — N2581 Secondary hyperparathyroidism of renal origin: Secondary | ICD-10-CM | POA: Diagnosis not present

## 2016-12-20 DIAGNOSIS — D509 Iron deficiency anemia, unspecified: Secondary | ICD-10-CM | POA: Diagnosis not present

## 2016-12-20 DIAGNOSIS — D631 Anemia in chronic kidney disease: Secondary | ICD-10-CM | POA: Diagnosis not present

## 2016-12-23 DIAGNOSIS — D509 Iron deficiency anemia, unspecified: Secondary | ICD-10-CM | POA: Diagnosis not present

## 2016-12-23 DIAGNOSIS — N2581 Secondary hyperparathyroidism of renal origin: Secondary | ICD-10-CM | POA: Diagnosis not present

## 2016-12-23 DIAGNOSIS — Z23 Encounter for immunization: Secondary | ICD-10-CM | POA: Diagnosis not present

## 2016-12-23 DIAGNOSIS — N186 End stage renal disease: Secondary | ICD-10-CM | POA: Diagnosis not present

## 2016-12-23 DIAGNOSIS — D631 Anemia in chronic kidney disease: Secondary | ICD-10-CM | POA: Diagnosis not present

## 2016-12-24 DIAGNOSIS — Z992 Dependence on renal dialysis: Secondary | ICD-10-CM | POA: Diagnosis not present

## 2016-12-24 DIAGNOSIS — I129 Hypertensive chronic kidney disease with stage 1 through stage 4 chronic kidney disease, or unspecified chronic kidney disease: Secondary | ICD-10-CM | POA: Diagnosis not present

## 2016-12-24 DIAGNOSIS — N186 End stage renal disease: Secondary | ICD-10-CM | POA: Diagnosis not present

## 2016-12-25 DIAGNOSIS — D631 Anemia in chronic kidney disease: Secondary | ICD-10-CM | POA: Diagnosis not present

## 2016-12-25 DIAGNOSIS — N2581 Secondary hyperparathyroidism of renal origin: Secondary | ICD-10-CM | POA: Diagnosis not present

## 2016-12-25 DIAGNOSIS — D509 Iron deficiency anemia, unspecified: Secondary | ICD-10-CM | POA: Diagnosis not present

## 2016-12-25 DIAGNOSIS — N186 End stage renal disease: Secondary | ICD-10-CM | POA: Diagnosis not present

## 2016-12-27 NOTE — Progress Notes (Deleted)
Cardiology Office Note    Date:  12/27/2016   ID:  [name], DOB 01/27/1956, MRN [id number]  PCP:  No PCP Per Patient  Cardiologist:  Peter [name], MD    History of Present Illness:  Kenneth Kenneth is a 61 y.o. male seen for follow up nonischemic CM, atrial flutter, NSVT. He was initially seen in July 2017 with acute renal failure, atrial flutter, and new onset cardiomyopathy with EF 25-30%. He had a history of heavy Etoh abuse. He was started on HD. He was placed on Coreg, hydralazine, and nitrates. He returned in September after feeling acutely weak and lightheaded. Was noted to have a 9 beat run of NSVT.  Myoview study showed inferior attenuation c/w soft tissue attenuation versus scar. No ischemia. EF 37%. He was discharged on Coreg only. Hydralazine and nitrates discontinued. When admitted in September he was still in Afib but when seen in office 07/22/16 he was back in NSR. Felt to be a poor candidate for anticoagulation due to Etoh abuse and noncompliance. Admitted in December 2017 with syncope/ near syncope post dialysis. No true loss of consciousness. Felt to be related to hypotension post dialysis.  QT mildly prolonged. In NSR. Echo showed normalization of LV function. Continued on Coreg 12.5 mg bid.    Past Medical History:  Diagnosis Date  . Cardiomyopathy secondary    likely related to HTN heart disease; possibly ETOH related as well  . Chronic combined systolic and diastolic heart failure (HCC)    Echocardiogram 09/22/11: Moderate LVH, EF 40-98%, grade 3 diastolic dysfunction, mild MR, moderate to severe LAE, mild RVE, mild to moderate TR, small to moderate pericardial effusion  . CKD (chronic kidney disease) stage 4, GFR 15-29 ml/min (HCC)    renal failure; due to hypertensive nephrosclerosis  . History of alcohol abuse   . Hypertension     Past Surgical History:  Procedure Laterality Date  . AV FISTULA PLACEMENT Left 05/14/2016   Procedure: LEFT ARM BASILIC VEIN  TRANSPOSITION;  Surgeon: Rosetta Posner, MD;  Location: [place];  Service: Vascular;  Laterality: Left;  . PERIPHERAL VASCULAR CATHETERIZATION N/A 05/13/2016   Procedure: Dialysis/Perma Catheter Insertion;  Surgeon: Serafina Mitchell, MD;  Location: [place] [facility];  Service: Cardiovascular;  Laterality: N/A;    Current Medications: Outpatient Medications Prior to Visit  Medication Sig Dispense Refill  . acetaminophen (TYLENOL) 325 MG tablet Take 2 tablets (650 mg total) by mouth every 6 (six) hours as needed for mild pain (or Fever >/= 101).     No facility-administered medications prior to visit.      Allergies:   Patient has no known allergies.   Social History   Social History  . Marital status: Married    Spouse name: N/A  . Number of children: N/A  . Years of education: N/A   Social History Main Topics  . Smoking status: Former Smoker    Quit date: 08/15/2014  . Smokeless tobacco: Current User  . Alcohol use 1.8 oz/week    3 Cans of beer per week     Comment: occ  . Drug use: No  . Sexual activity: Not on file   Other Topics Concern  . Not on file   Social History Narrative  . No narrative on file     Family History:  The patient's family history includes Cirrhosis in his father; Emphysema in his mother.   ROS:   Please see the history of present illness.  ROS All other systems reviewed and are negative.   PHYSICAL EXAM:   VS:  There were no vitals taken for this visit.   GEN: Well nourished, well developed, in no acute distress  HEENT: normal  Neck: no JVD, carotid bruits, or masses Cardiac: RRR; no murmurs, rubs, or gallops,no edema  Respiratory:  clear to auscultation bilaterally, normal work of breathing GI: soft, nontender, nondistended, + BS MS: no deformity or atrophy  Skin: warm and dry, no rash Neuro:  Alert and Oriented x 3, Strength and sensation are intact Psych: euthymic mood, full affect  Wt Readings from Last 3 Encounters:  10/11/16 176  lb 9.4 oz (80.1 kg)  08/15/16 175 lb (79.4 kg)  07/22/16 181 lb (82.1 kg)      Studies/Labs Reviewed:   EKG:  EKG is*** ordered today.  The ekg ordered today demonstrates ***  Recent Labs: 05/12/2016: B Natriuretic Peptide 2,191.8 10/07/2016: ALT 12 10/08/2016: Magnesium 2.2; TSH 1.391 10/11/2016: BUN 58; Creatinine, Ser 11.36; Hemoglobin 9.5; Platelets 185; Potassium 4.1; Sodium 136   Lipid Panel No results found for: CHOL, TRIG, HDL, CHOLHDL, VLDL, LDLCALC, LDLDIRECT  Additional studies/ records that were reviewed today include:  Echo 10/09/16: Study Conclusions  - Left ventricle: The cavity size was normal. Wall thickness was   increased in a pattern of moderate LVH. Systolic function was   normal. The estimated ejection fraction was in the range of 55%   to 60%. Wall motion was normal; there were no regional wall   motion abnormalities. Doppler parameters are consistent with   abnormal left ventricular relaxation (grade 1 diastolic   dysfunction).    ASSESSMENT:    No diagnosis found.   PLAN:  In order of problems listed above:  1. ***    Medication Adjustments/Labs and Tests Ordered: Current medicines are reviewed at length with the patient today.  Concerns regarding medicines are outlined above.  Medication changes, Labs and Tests ordered today are listed in the Patient Instructions below. There are no Patient Instructions on file for this visit.   Signed, Peter [name], MD  12/27/2016 2:53 PM    [facility] [address] [place], [place], [address] [phone]

## 2016-12-30 ENCOUNTER — Encounter: Payer: Self-pay | Admitting: *Deleted

## 2016-12-30 ENCOUNTER — Ambulatory Visit: Payer: Medicaid Other | Admitting: Cardiology

## 2016-12-30 DIAGNOSIS — D631 Anemia in chronic kidney disease: Secondary | ICD-10-CM | POA: Diagnosis not present

## 2016-12-30 DIAGNOSIS — D509 Iron deficiency anemia, unspecified: Secondary | ICD-10-CM | POA: Diagnosis not present

## 2016-12-30 DIAGNOSIS — N2581 Secondary hyperparathyroidism of renal origin: Secondary | ICD-10-CM | POA: Diagnosis not present

## 2016-12-30 DIAGNOSIS — N186 End stage renal disease: Secondary | ICD-10-CM | POA: Diagnosis not present

## 2017-01-01 DIAGNOSIS — D631 Anemia in chronic kidney disease: Secondary | ICD-10-CM | POA: Diagnosis not present

## 2017-01-01 DIAGNOSIS — N2581 Secondary hyperparathyroidism of renal origin: Secondary | ICD-10-CM | POA: Diagnosis not present

## 2017-01-01 DIAGNOSIS — N186 End stage renal disease: Secondary | ICD-10-CM | POA: Diagnosis not present

## 2017-01-01 DIAGNOSIS — D509 Iron deficiency anemia, unspecified: Secondary | ICD-10-CM | POA: Diagnosis not present

## 2017-01-03 DIAGNOSIS — D631 Anemia in chronic kidney disease: Secondary | ICD-10-CM | POA: Diagnosis not present

## 2017-01-03 DIAGNOSIS — N2581 Secondary hyperparathyroidism of renal origin: Secondary | ICD-10-CM | POA: Diagnosis not present

## 2017-01-03 DIAGNOSIS — N186 End stage renal disease: Secondary | ICD-10-CM | POA: Diagnosis not present

## 2017-01-03 DIAGNOSIS — D509 Iron deficiency anemia, unspecified: Secondary | ICD-10-CM | POA: Diagnosis not present

## 2017-01-05 ENCOUNTER — Ambulatory Visit: Payer: Medicaid Other | Admitting: Neurology

## 2017-01-08 DIAGNOSIS — N2581 Secondary hyperparathyroidism of renal origin: Secondary | ICD-10-CM | POA: Diagnosis not present

## 2017-01-08 DIAGNOSIS — D631 Anemia in chronic kidney disease: Secondary | ICD-10-CM | POA: Diagnosis not present

## 2017-01-08 DIAGNOSIS — N186 End stage renal disease: Secondary | ICD-10-CM | POA: Diagnosis not present

## 2017-01-08 DIAGNOSIS — D509 Iron deficiency anemia, unspecified: Secondary | ICD-10-CM | POA: Diagnosis not present

## 2017-01-10 DIAGNOSIS — D631 Anemia in chronic kidney disease: Secondary | ICD-10-CM | POA: Diagnosis not present

## 2017-01-10 DIAGNOSIS — D509 Iron deficiency anemia, unspecified: Secondary | ICD-10-CM | POA: Diagnosis not present

## 2017-01-10 DIAGNOSIS — N2581 Secondary hyperparathyroidism of renal origin: Secondary | ICD-10-CM | POA: Diagnosis not present

## 2017-01-10 DIAGNOSIS — N186 End stage renal disease: Secondary | ICD-10-CM | POA: Diagnosis not present

## 2017-01-13 DIAGNOSIS — D631 Anemia in chronic kidney disease: Secondary | ICD-10-CM | POA: Diagnosis not present

## 2017-01-13 DIAGNOSIS — D509 Iron deficiency anemia, unspecified: Secondary | ICD-10-CM | POA: Diagnosis not present

## 2017-01-13 DIAGNOSIS — N186 End stage renal disease: Secondary | ICD-10-CM | POA: Diagnosis not present

## 2017-01-13 DIAGNOSIS — N2581 Secondary hyperparathyroidism of renal origin: Secondary | ICD-10-CM | POA: Diagnosis not present

## 2017-01-15 DIAGNOSIS — D509 Iron deficiency anemia, unspecified: Secondary | ICD-10-CM | POA: Diagnosis not present

## 2017-01-15 DIAGNOSIS — N2581 Secondary hyperparathyroidism of renal origin: Secondary | ICD-10-CM | POA: Diagnosis not present

## 2017-01-15 DIAGNOSIS — D631 Anemia in chronic kidney disease: Secondary | ICD-10-CM | POA: Diagnosis not present

## 2017-01-15 DIAGNOSIS — N186 End stage renal disease: Secondary | ICD-10-CM | POA: Diagnosis not present

## 2017-01-17 DIAGNOSIS — N186 End stage renal disease: Secondary | ICD-10-CM | POA: Diagnosis not present

## 2017-01-17 DIAGNOSIS — D509 Iron deficiency anemia, unspecified: Secondary | ICD-10-CM | POA: Diagnosis not present

## 2017-01-17 DIAGNOSIS — D631 Anemia in chronic kidney disease: Secondary | ICD-10-CM | POA: Diagnosis not present

## 2017-01-17 DIAGNOSIS — N2581 Secondary hyperparathyroidism of renal origin: Secondary | ICD-10-CM | POA: Diagnosis not present

## 2017-01-20 DIAGNOSIS — N2581 Secondary hyperparathyroidism of renal origin: Secondary | ICD-10-CM | POA: Diagnosis not present

## 2017-01-20 DIAGNOSIS — D631 Anemia in chronic kidney disease: Secondary | ICD-10-CM | POA: Diagnosis not present

## 2017-01-20 DIAGNOSIS — N186 End stage renal disease: Secondary | ICD-10-CM | POA: Diagnosis not present

## 2017-01-20 DIAGNOSIS — D509 Iron deficiency anemia, unspecified: Secondary | ICD-10-CM | POA: Diagnosis not present

## 2017-01-22 DIAGNOSIS — N186 End stage renal disease: Secondary | ICD-10-CM | POA: Diagnosis not present

## 2017-01-22 DIAGNOSIS — N2581 Secondary hyperparathyroidism of renal origin: Secondary | ICD-10-CM | POA: Diagnosis not present

## 2017-01-22 DIAGNOSIS — D631 Anemia in chronic kidney disease: Secondary | ICD-10-CM | POA: Diagnosis not present

## 2017-01-22 DIAGNOSIS — D509 Iron deficiency anemia, unspecified: Secondary | ICD-10-CM | POA: Diagnosis not present

## 2017-01-24 DIAGNOSIS — D509 Iron deficiency anemia, unspecified: Secondary | ICD-10-CM | POA: Diagnosis not present

## 2017-01-24 DIAGNOSIS — N2581 Secondary hyperparathyroidism of renal origin: Secondary | ICD-10-CM | POA: Diagnosis not present

## 2017-01-24 DIAGNOSIS — N186 End stage renal disease: Secondary | ICD-10-CM | POA: Diagnosis not present

## 2017-01-24 DIAGNOSIS — I129 Hypertensive chronic kidney disease with stage 1 through stage 4 chronic kidney disease, or unspecified chronic kidney disease: Secondary | ICD-10-CM | POA: Diagnosis not present

## 2017-01-24 DIAGNOSIS — Z992 Dependence on renal dialysis: Secondary | ICD-10-CM | POA: Diagnosis not present

## 2017-01-24 DIAGNOSIS — D631 Anemia in chronic kidney disease: Secondary | ICD-10-CM | POA: Diagnosis not present

## 2017-01-27 DIAGNOSIS — N186 End stage renal disease: Secondary | ICD-10-CM | POA: Diagnosis not present

## 2017-01-27 DIAGNOSIS — D509 Iron deficiency anemia, unspecified: Secondary | ICD-10-CM | POA: Diagnosis not present

## 2017-01-27 DIAGNOSIS — N2581 Secondary hyperparathyroidism of renal origin: Secondary | ICD-10-CM | POA: Diagnosis not present

## 2017-01-27 NOTE — Progress Notes (Deleted)
Cardiology Office Note    Date:  01/27/2017   ID:  Kenneth [name], DOB Feb 18, 1956, MRN [id number]  PCP:  No PCP Per Patient  Cardiologist:  Allyanna Appleman [name], MD    History of Present Illness:  Kenneth [name] is a 61 y.o. male seen for follow up of syncope/near syncope. He was initially seen in July 2017 with acute renal failure, atrial flutter, and new onset cardiomyopathy with EF 25-30%. He had a history of heavy Etoh abuse. He was started on HD. He was placed on Coreg, hydralazine, and nitrates. He returned in September after feeling acutely weak and lightheaded. Was noted to have a 9 beat run of NSVT.  Myoview study showed inferior attenuation c/w soft tissue attenuation versus scar. No ischemia. EF 37%. He was discharged on Coreg only. Hydralazine and nitrates discontinued.  He was readmitted in December 2017 with another episode of near syncope. Patient reports he fell down when getting on scales after dialysis yesterday. Felt lightheaded but denies LOC. No chest pain, dyspnea, palpitations. He was brought to the ED but became frustrated and left. Later found by the road and brought back to the ED. Noted to be confused and even now doesn't recall events after he left ED. He was hypothermic. He tells me that he often feels weak and lightheaded after dialysis and has to go sit down. Denies he ever lost consciousness. Echo at that time showed Normal LV function. His near syncope was felt to be related to orthostatic hypotension which improved with some hydration and adjustment of his dry weight. Coreg was also discontinued.  Previously he was felt to be a poor candidate for anticoagulation due to compliance and Etoh use. He states now he only drinks an occasional beer. When admitted in September he was still in Afib but when seen in office 07/22/16 he was back in NSR.    Past Medical History:  Diagnosis Date  . Cardiomyopathy secondary    likely related to HTN heart disease; possibly ETOH related as  well  . Chronic combined systolic and diastolic heart failure (HCC)    Echocardiogram 09/22/11: Moderate LVH, EF 67-34%, grade 3 diastolic dysfunction, mild MR, moderate to severe LAE, mild RVE, mild to moderate TR, small to moderate pericardial effusion  . CKD (chronic kidney disease) stage 4, GFR 15-29 ml/min (HCC)    renal failure; due to hypertensive nephrosclerosis  . History of alcohol abuse   . Hypertension     Past Surgical History:  Procedure Laterality Date  . AV FISTULA PLACEMENT Left 05/14/2016   Procedure: LEFT ARM BASILIC VEIN TRANSPOSITION;  Surgeon: Rosetta Posner, MD;  Location: [place];  Service: Vascular;  Laterality: Left;  . PERIPHERAL VASCULAR CATHETERIZATION N/A 05/13/2016   Procedure: Dialysis/Perma Catheter Insertion;  Surgeon: Serafina Mitchell, MD;  Location: [place] [facility];  Service: Cardiovascular;  Laterality: N/A;    Current Medications: Outpatient Medications Prior to Visit  Medication Sig Dispense Refill  . acetaminophen (TYLENOL) 325 MG tablet Take 2 tablets (650 mg total) by mouth every 6 (six) hours as needed for mild pain (or Fever >/= 101).     No facility-administered medications prior to visit.      Allergies:   Patient has no known allergies.   Social History   Social History  . Marital status: Married    Spouse name: N/A  . Number of children: N/A  . Years of education: N/A   Social History Main Topics  . Smoking status: Former  Smoker    Quit date: 08/15/2014  . Smokeless tobacco: Current User  . Alcohol use 1.8 oz/week    3 Cans of beer per week     Comment: occ  . Drug use: No  . Sexual activity: Not on file   Other Topics Concern  . Not on file   Social History Narrative  . No narrative on file     Family History:  The patient's family history includes Cirrhosis in his father; Emphysema in his mother.   ROS:   Please see the history of present illness.    ROS All other systems reviewed and are negative.   PHYSICAL  EXAM:   VS:  There were no vitals taken for this visit.   GENERAL:  WDBM in NAD HEENT:  PERRL, EOMI, sclera are clear. Oropharynx is clear. Atraumatic. NECK:  No jugular venous distention, carotid upstroke brisk and symmetric, no bruits, no thyromegaly or adenopathy LUNGS:  Clear to auscultation bilaterally CHEST:  Unremarkable HEART:  RRR,  PMI not displaced or sustained,S1 and S2 within normal limits, no S3, no S4: no clicks, no rubs, no murmurs ABD:  Soft, nontender. BS +, no masses or bruits. No hepatomegaly, no splenomegaly EXT:  2 + pulses throughout, no edema, no cyanosis no clubbing, AV fistula left arm. SKIN:  Warm and dry.  No rashes NEURO:  Alert and oriented x 3. Cranial nerves II through XII intact. PSYCH:  Cognitively intact  Wt Readings from Last 3 Encounters:  10/11/16 176 lb 9.4 oz (80.1 kg)  08/15/16 175 lb (79.4 kg)  07/22/16 181 lb (82.1 kg)      Studies/Labs Reviewed:   EKG:  EKG is*** ordered today.  The ekg ordered today demonstrates ***  Recent Labs: 05/12/2016: B Natriuretic Peptide 2,191.8 10/07/2016: ALT 12 10/08/2016: Magnesium 2.2; TSH 1.391 10/11/2016: BUN 58; Creatinine, Ser 11.36; Hemoglobin 9.5; Platelets 185; Potassium 4.1; Sodium 136   Lipid Panel No results found for: CHOL, TRIG, HDL, CHOLHDL, VLDL, LDLCALC, LDLDIRECT  Additional studies/ records that were reviewed today include:  Echo 10/09/16: Study Conclusions  - Left ventricle: The cavity size was normal. Wall thickness was   increased in a pattern of moderate LVH. Systolic function was   normal. The estimated ejection fraction was in the range of 55%   to 60%. Wall motion was normal; there were no regional wall   motion abnormalities. Doppler parameters are consistent with   abnormal left ventricular relaxation (grade 1 diastolic   dysfunction).   ASSESSMENT:    No diagnosis found.   PLAN:  In order of problems listed above:  1. Near syncope. Suspect postural hypotension  with volume shift post dialysis. No true loss of consciousness. Doubt arrhythmia. May need to reduce or hold Coreg prior to dialysis. Will update Echo to assess LV function. Continue to monitor on telemetry. Repeat Ecg.  2. Minimal troponin elevation with flat trend. No evidence of ACS. 3. History of atrial flutter/fibrillation- resolved. 4. ESRD on HD 5. Prolonged QT. New. Potassium 5.8>>4.4. Will check magnesium level. Repeat Ecg. Avoid QT prolonging drugs.  6. Nonischemic cardiomyopathy. Update Echo. Therapy limited by hypotension and ESRD.  7. History of ETOH abuse. Alcohol level normal on admit. Reports less Etoh intake.    Medication Adjustments/Labs and Tests Ordered: Current medicines are reviewed at length with the patient today.  Concerns regarding medicines are outlined above.  Medication changes, Labs and Tests ordered today are listed in the Patient Instructions below. There are no  Patient Instructions on file for this visit.   Signed, Mylo Driskill [name], MD  01/27/2017 1:21 PM    [facility] [address] [place], [place], [address] [phone]

## 2017-01-28 ENCOUNTER — Ambulatory Visit: Payer: Self-pay | Admitting: Cardiology

## 2017-01-29 DIAGNOSIS — N2581 Secondary hyperparathyroidism of renal origin: Secondary | ICD-10-CM | POA: Diagnosis not present

## 2017-01-29 DIAGNOSIS — N186 End stage renal disease: Secondary | ICD-10-CM | POA: Diagnosis not present

## 2017-01-29 DIAGNOSIS — D509 Iron deficiency anemia, unspecified: Secondary | ICD-10-CM | POA: Diagnosis not present

## 2017-01-31 DIAGNOSIS — N186 End stage renal disease: Secondary | ICD-10-CM | POA: Diagnosis not present

## 2017-01-31 DIAGNOSIS — D509 Iron deficiency anemia, unspecified: Secondary | ICD-10-CM | POA: Diagnosis not present

## 2017-01-31 DIAGNOSIS — N2581 Secondary hyperparathyroidism of renal origin: Secondary | ICD-10-CM | POA: Diagnosis not present

## 2017-02-04 DIAGNOSIS — N2581 Secondary hyperparathyroidism of renal origin: Secondary | ICD-10-CM | POA: Diagnosis not present

## 2017-02-04 DIAGNOSIS — N186 End stage renal disease: Secondary | ICD-10-CM | POA: Diagnosis not present

## 2017-02-04 DIAGNOSIS — D509 Iron deficiency anemia, unspecified: Secondary | ICD-10-CM | POA: Diagnosis not present

## 2017-02-07 DIAGNOSIS — N186 End stage renal disease: Secondary | ICD-10-CM | POA: Diagnosis not present

## 2017-02-07 DIAGNOSIS — N2581 Secondary hyperparathyroidism of renal origin: Secondary | ICD-10-CM | POA: Diagnosis not present

## 2017-02-07 DIAGNOSIS — D509 Iron deficiency anemia, unspecified: Secondary | ICD-10-CM | POA: Diagnosis not present

## 2017-02-10 DIAGNOSIS — N186 End stage renal disease: Secondary | ICD-10-CM | POA: Diagnosis not present

## 2017-02-10 DIAGNOSIS — D509 Iron deficiency anemia, unspecified: Secondary | ICD-10-CM | POA: Diagnosis not present

## 2017-02-10 DIAGNOSIS — N2581 Secondary hyperparathyroidism of renal origin: Secondary | ICD-10-CM | POA: Diagnosis not present

## 2017-02-12 DIAGNOSIS — N186 End stage renal disease: Secondary | ICD-10-CM | POA: Diagnosis not present

## 2017-02-12 DIAGNOSIS — N2581 Secondary hyperparathyroidism of renal origin: Secondary | ICD-10-CM | POA: Diagnosis not present

## 2017-02-12 DIAGNOSIS — D509 Iron deficiency anemia, unspecified: Secondary | ICD-10-CM | POA: Diagnosis not present

## 2017-02-14 DIAGNOSIS — D509 Iron deficiency anemia, unspecified: Secondary | ICD-10-CM | POA: Diagnosis not present

## 2017-02-14 DIAGNOSIS — N2581 Secondary hyperparathyroidism of renal origin: Secondary | ICD-10-CM | POA: Diagnosis not present

## 2017-02-14 DIAGNOSIS — N186 End stage renal disease: Secondary | ICD-10-CM | POA: Diagnosis not present

## 2017-02-17 DIAGNOSIS — D509 Iron deficiency anemia, unspecified: Secondary | ICD-10-CM | POA: Diagnosis not present

## 2017-02-17 DIAGNOSIS — N2581 Secondary hyperparathyroidism of renal origin: Secondary | ICD-10-CM | POA: Diagnosis not present

## 2017-02-17 DIAGNOSIS — N186 End stage renal disease: Secondary | ICD-10-CM | POA: Diagnosis not present

## 2017-02-19 DIAGNOSIS — D509 Iron deficiency anemia, unspecified: Secondary | ICD-10-CM | POA: Diagnosis not present

## 2017-02-19 DIAGNOSIS — N186 End stage renal disease: Secondary | ICD-10-CM | POA: Diagnosis not present

## 2017-02-19 DIAGNOSIS — N2581 Secondary hyperparathyroidism of renal origin: Secondary | ICD-10-CM | POA: Diagnosis not present

## 2017-02-21 DIAGNOSIS — D509 Iron deficiency anemia, unspecified: Secondary | ICD-10-CM | POA: Diagnosis not present

## 2017-02-21 DIAGNOSIS — N186 End stage renal disease: Secondary | ICD-10-CM | POA: Diagnosis not present

## 2017-02-21 DIAGNOSIS — N2581 Secondary hyperparathyroidism of renal origin: Secondary | ICD-10-CM | POA: Diagnosis not present

## 2017-02-23 DIAGNOSIS — Z992 Dependence on renal dialysis: Secondary | ICD-10-CM | POA: Diagnosis not present

## 2017-02-23 DIAGNOSIS — N186 End stage renal disease: Secondary | ICD-10-CM | POA: Diagnosis not present

## 2017-02-23 DIAGNOSIS — I129 Hypertensive chronic kidney disease with stage 1 through stage 4 chronic kidney disease, or unspecified chronic kidney disease: Secondary | ICD-10-CM | POA: Diagnosis not present

## 2017-02-24 DIAGNOSIS — N186 End stage renal disease: Secondary | ICD-10-CM | POA: Diagnosis not present

## 2017-02-24 DIAGNOSIS — N2581 Secondary hyperparathyroidism of renal origin: Secondary | ICD-10-CM | POA: Diagnosis not present

## 2017-02-24 DIAGNOSIS — D509 Iron deficiency anemia, unspecified: Secondary | ICD-10-CM | POA: Diagnosis not present

## 2017-02-26 DIAGNOSIS — N186 End stage renal disease: Secondary | ICD-10-CM | POA: Diagnosis not present

## 2017-02-26 DIAGNOSIS — D509 Iron deficiency anemia, unspecified: Secondary | ICD-10-CM | POA: Diagnosis not present

## 2017-02-26 DIAGNOSIS — N2581 Secondary hyperparathyroidism of renal origin: Secondary | ICD-10-CM | POA: Diagnosis not present

## 2017-02-28 DIAGNOSIS — D509 Iron deficiency anemia, unspecified: Secondary | ICD-10-CM | POA: Diagnosis not present

## 2017-02-28 DIAGNOSIS — N186 End stage renal disease: Secondary | ICD-10-CM | POA: Diagnosis not present

## 2017-02-28 DIAGNOSIS — N2581 Secondary hyperparathyroidism of renal origin: Secondary | ICD-10-CM | POA: Diagnosis not present

## 2017-03-03 DIAGNOSIS — N2581 Secondary hyperparathyroidism of renal origin: Secondary | ICD-10-CM | POA: Diagnosis not present

## 2017-03-03 DIAGNOSIS — N186 End stage renal disease: Secondary | ICD-10-CM | POA: Diagnosis not present

## 2017-03-03 DIAGNOSIS — D509 Iron deficiency anemia, unspecified: Secondary | ICD-10-CM | POA: Diagnosis not present

## 2017-03-05 DIAGNOSIS — N2581 Secondary hyperparathyroidism of renal origin: Secondary | ICD-10-CM | POA: Diagnosis not present

## 2017-03-05 DIAGNOSIS — D509 Iron deficiency anemia, unspecified: Secondary | ICD-10-CM | POA: Diagnosis not present

## 2017-03-05 DIAGNOSIS — N186 End stage renal disease: Secondary | ICD-10-CM | POA: Diagnosis not present

## 2017-03-07 DIAGNOSIS — N186 End stage renal disease: Secondary | ICD-10-CM | POA: Diagnosis not present

## 2017-03-07 DIAGNOSIS — D509 Iron deficiency anemia, unspecified: Secondary | ICD-10-CM | POA: Diagnosis not present

## 2017-03-07 DIAGNOSIS — N2581 Secondary hyperparathyroidism of renal origin: Secondary | ICD-10-CM | POA: Diagnosis not present

## 2017-03-10 DIAGNOSIS — D509 Iron deficiency anemia, unspecified: Secondary | ICD-10-CM | POA: Diagnosis not present

## 2017-03-10 DIAGNOSIS — N2581 Secondary hyperparathyroidism of renal origin: Secondary | ICD-10-CM | POA: Diagnosis not present

## 2017-03-10 DIAGNOSIS — N186 End stage renal disease: Secondary | ICD-10-CM | POA: Diagnosis not present

## 2017-03-12 DIAGNOSIS — N186 End stage renal disease: Secondary | ICD-10-CM | POA: Diagnosis not present

## 2017-03-12 DIAGNOSIS — D509 Iron deficiency anemia, unspecified: Secondary | ICD-10-CM | POA: Diagnosis not present

## 2017-03-12 DIAGNOSIS — N2581 Secondary hyperparathyroidism of renal origin: Secondary | ICD-10-CM | POA: Diagnosis not present

## 2017-03-14 DIAGNOSIS — N186 End stage renal disease: Secondary | ICD-10-CM | POA: Diagnosis not present

## 2017-03-14 DIAGNOSIS — D509 Iron deficiency anemia, unspecified: Secondary | ICD-10-CM | POA: Diagnosis not present

## 2017-03-14 DIAGNOSIS — N2581 Secondary hyperparathyroidism of renal origin: Secondary | ICD-10-CM | POA: Diagnosis not present

## 2017-03-17 DIAGNOSIS — D509 Iron deficiency anemia, unspecified: Secondary | ICD-10-CM | POA: Diagnosis not present

## 2017-03-17 DIAGNOSIS — N186 End stage renal disease: Secondary | ICD-10-CM | POA: Diagnosis not present

## 2017-03-17 DIAGNOSIS — N2581 Secondary hyperparathyroidism of renal origin: Secondary | ICD-10-CM | POA: Diagnosis not present

## 2017-03-19 DIAGNOSIS — D509 Iron deficiency anemia, unspecified: Secondary | ICD-10-CM | POA: Diagnosis not present

## 2017-03-19 DIAGNOSIS — N2581 Secondary hyperparathyroidism of renal origin: Secondary | ICD-10-CM | POA: Diagnosis not present

## 2017-03-19 DIAGNOSIS — N186 End stage renal disease: Secondary | ICD-10-CM | POA: Diagnosis not present

## 2017-03-21 DIAGNOSIS — N2581 Secondary hyperparathyroidism of renal origin: Secondary | ICD-10-CM | POA: Diagnosis not present

## 2017-03-21 DIAGNOSIS — N186 End stage renal disease: Secondary | ICD-10-CM | POA: Diagnosis not present

## 2017-03-21 DIAGNOSIS — D509 Iron deficiency anemia, unspecified: Secondary | ICD-10-CM | POA: Diagnosis not present

## 2017-03-24 DIAGNOSIS — N186 End stage renal disease: Secondary | ICD-10-CM | POA: Diagnosis not present

## 2017-03-24 DIAGNOSIS — D509 Iron deficiency anemia, unspecified: Secondary | ICD-10-CM | POA: Diagnosis not present

## 2017-03-24 DIAGNOSIS — N2581 Secondary hyperparathyroidism of renal origin: Secondary | ICD-10-CM | POA: Diagnosis not present

## 2017-03-26 DIAGNOSIS — N2581 Secondary hyperparathyroidism of renal origin: Secondary | ICD-10-CM | POA: Diagnosis not present

## 2017-03-26 DIAGNOSIS — D509 Iron deficiency anemia, unspecified: Secondary | ICD-10-CM | POA: Diagnosis not present

## 2017-03-26 DIAGNOSIS — I129 Hypertensive chronic kidney disease with stage 1 through stage 4 chronic kidney disease, or unspecified chronic kidney disease: Secondary | ICD-10-CM | POA: Diagnosis not present

## 2017-03-26 DIAGNOSIS — N186 End stage renal disease: Secondary | ICD-10-CM | POA: Diagnosis not present

## 2017-03-26 DIAGNOSIS — Z992 Dependence on renal dialysis: Secondary | ICD-10-CM | POA: Diagnosis not present

## 2017-03-28 DIAGNOSIS — N186 End stage renal disease: Secondary | ICD-10-CM | POA: Diagnosis not present

## 2017-03-28 DIAGNOSIS — D509 Iron deficiency anemia, unspecified: Secondary | ICD-10-CM | POA: Diagnosis not present

## 2017-03-28 DIAGNOSIS — N2581 Secondary hyperparathyroidism of renal origin: Secondary | ICD-10-CM | POA: Diagnosis not present

## 2017-03-31 DIAGNOSIS — N186 End stage renal disease: Secondary | ICD-10-CM | POA: Diagnosis not present

## 2017-03-31 DIAGNOSIS — D509 Iron deficiency anemia, unspecified: Secondary | ICD-10-CM | POA: Diagnosis not present

## 2017-03-31 DIAGNOSIS — N2581 Secondary hyperparathyroidism of renal origin: Secondary | ICD-10-CM | POA: Diagnosis not present

## 2017-04-02 DIAGNOSIS — N186 End stage renal disease: Secondary | ICD-10-CM | POA: Diagnosis not present

## 2017-04-02 DIAGNOSIS — D509 Iron deficiency anemia, unspecified: Secondary | ICD-10-CM | POA: Diagnosis not present

## 2017-04-02 DIAGNOSIS — N2581 Secondary hyperparathyroidism of renal origin: Secondary | ICD-10-CM | POA: Diagnosis not present

## 2017-04-04 DIAGNOSIS — D509 Iron deficiency anemia, unspecified: Secondary | ICD-10-CM | POA: Diagnosis not present

## 2017-04-04 DIAGNOSIS — N2581 Secondary hyperparathyroidism of renal origin: Secondary | ICD-10-CM | POA: Diagnosis not present

## 2017-04-04 DIAGNOSIS — N186 End stage renal disease: Secondary | ICD-10-CM | POA: Diagnosis not present

## 2017-04-07 DIAGNOSIS — N2581 Secondary hyperparathyroidism of renal origin: Secondary | ICD-10-CM | POA: Diagnosis not present

## 2017-04-07 DIAGNOSIS — D509 Iron deficiency anemia, unspecified: Secondary | ICD-10-CM | POA: Diagnosis not present

## 2017-04-07 DIAGNOSIS — N186 End stage renal disease: Secondary | ICD-10-CM | POA: Diagnosis not present

## 2017-04-09 DIAGNOSIS — N2581 Secondary hyperparathyroidism of renal origin: Secondary | ICD-10-CM | POA: Diagnosis not present

## 2017-04-09 DIAGNOSIS — D509 Iron deficiency anemia, unspecified: Secondary | ICD-10-CM | POA: Diagnosis not present

## 2017-04-09 DIAGNOSIS — N186 End stage renal disease: Secondary | ICD-10-CM | POA: Diagnosis not present

## 2017-04-11 DIAGNOSIS — D509 Iron deficiency anemia, unspecified: Secondary | ICD-10-CM | POA: Diagnosis not present

## 2017-04-11 DIAGNOSIS — N186 End stage renal disease: Secondary | ICD-10-CM | POA: Diagnosis not present

## 2017-04-11 DIAGNOSIS — N2581 Secondary hyperparathyroidism of renal origin: Secondary | ICD-10-CM | POA: Diagnosis not present

## 2017-04-12 NOTE — Progress Notes (Signed)
Cardiology Office Note   Date:  04/15/2017   ID:  Kenneth [name], DOB 08/29/1956, MRN [id number]  PCP:  Patient, No Pcp Per  Cardiologist:   Janaye Corp [name], MD   Chief Complaint  Patient presents with  . Follow-up  . Headache      History of Present Illness: Kenneth [name] is a 61 y.o. male who presents for follow up NSVT and Cardiomyopathy. He has a history of a history of S-D-CHF, NSVT, ESRD on HD, HTN, ETOH, NICM w/ EF 25-30% by echo 04/2016  Admitted in 09/12 after admit for hypoglycemia, vol mgt w/ HD. He was seen for  NSVT>>BB increased, Myoview showed  scar but no ischemia and EF 30-44%, atrial flutter seen,[id number]-4, not anticoag candidate. He was placed on Coreg but this had to be discontinued due to hypotension.  Follow up Echo In December 2017 showed normalization of EF.   On follow up today he reports he is doing well. No tachycardia. He did pass out in the store yesterday after having dialysis. No true loss of consciousness. No chest pain or SOB. States he is tolerating dialysis well and weight is stable.     Past Medical History:  Diagnosis Date  . Cardiomyopathy secondary    likely related to HTN heart disease; possibly ETOH related as well  . Chronic combined systolic and diastolic heart failure (HCC)    Echocardiogram 09/22/11: Moderate LVH, EF 79-89%, grade 3 diastolic dysfunction, mild MR, moderate to severe LAE, mild RVE, mild to moderate TR, small to moderate pericardial effusion  . CKD (chronic kidney disease) stage 4, GFR 15-29 ml/min (HCC)    renal failure; due to hypertensive nephrosclerosis  . History of alcohol abuse   . Hypertension     Past Surgical History:  Procedure Laterality Date  . AV FISTULA PLACEMENT Left 05/14/2016   Procedure: LEFT ARM BASILIC VEIN TRANSPOSITION;  Surgeon: Rosetta Posner, MD;  Location: [facility];  Service: Vascular;  Laterality: Left;  . PERIPHERAL VASCULAR CATHETERIZATION N/A 05/13/2016   Procedure: Dialysis/Perma Catheter  Insertion;  Surgeon: Serafina Mitchell, MD;  Location: [facility];  Service: Cardiovascular;  Laterality: N/A;     Current Outpatient Prescriptions  Medication Sig Dispense Refill  . acetaminophen (TYLENOL) 325 MG tablet Take 2 tablets (650 mg total) by mouth every 6 (six) hours as needed for mild pain (or Fever >/= 101).     No current facility-administered medications for this visit.     Allergies:   Patient has no known allergies.    Social History:  The patient  reports that he quit smoking about 2 years ago. He uses smokeless tobacco. He reports that he drinks about 1.8 oz of alcohol per week . He reports that he does not use drugs.   Family History:  The patient's family history includes Cirrhosis in his father; Emphysema in his mother.    ROS:  Please see the history of present illness.   Otherwise, review of systems are positive for numbness in fourth and fifth fingers on left hand.   All other systems are reviewed and negative.    PHYSICAL EXAM: VS:  BP 124/78   Pulse 98   Ht 5\' 10"  (1.778 m)   Wt 182 lb (82.6 kg)   BMI 26.11 kg/m  , BMI Body mass index is 26.11 kg/m. GEN: Well nourished, well developed, in no acute distress  HEENT: normal  Neck: no JVD, carotid bruits, or masses Cardiac: RRR; no murmurs,  rubs, or gallops,no edema  Respiratory:  clear to auscultation bilaterally, normal work of breathing GI: soft, nontender, nondistended, + BS MS: no deformity or atrophy, functioning AV fistula in left arm. Skin: warm and dry, no rash Neuro:  Strength and sensation are intact Psych: euthymic mood, full affect   EKG:  EKG is not ordered today. The ekg ordered today demonstrates N/A   Recent Labs: 05/12/2016: B Natriuretic Peptide 2,191.8 10/07/2016: ALT 12 10/08/2016: Magnesium 2.2; TSH 1.391 10/11/2016: BUN 58; Creatinine, Ser 11.36; Hemoglobin 9.5; Platelets 185; Potassium 4.1; Sodium 136    Lipid Panel No results found for: CHOL, TRIG, HDL,  CHOLHDL, VLDL, LDLCALC, LDLDIRECT    Wt Readings from Last 3 Encounters:  04/15/17 182 lb (82.6 kg)  10/11/16 176 lb 9.4 oz (80.1 kg)  08/15/16 175 lb (79.4 kg)      Other studies Reviewed:  Echo 05/12/17: Study Conclusions  - Left ventricle: The cavity size was mildly dilated. There was   moderate concentric hypertrophy. Systolic function was severely   reduced. The estimated ejection fraction was in the range of 25%   to 30%. Diffuse hypokinesis. The study was not technically   sufficient to allow evaluation of LV diastolic dysfunction due to   atrial flutter. - Aortic root: The aortic root was normal in size. - Ascending aorta: The ascending aorta was normal in size. - Mitral valve: There was moderate regurgitation. - Left atrium: The atrium was severely dilated. - Right ventricle: The cavity size was mildly dilated. Wall   thickness was normal. Systolic function was moderately reduced. - Right atrium: The atrium was moderately dilated. - Tricuspid valve: There was moderate-severe regurgitation. - Pulmonary arteries: Systolic pressure was severely increased. PA   peak pressure: 62 mm Hg (S). - Inferior vena cava: The vessel was normal in size. - Pericardium, extracardiac: A trivial pericardial effusion was   identified posterior to the heart. Features were not consistent   with tamponade physiology.  Impressions:  - When compared to the prior study from 12/09/2013 LVEF has   decreased, previously 60-65%, now 25-30% with diffuse   hypokinesis.   There is also a moderate RV systolic dysfunction.   Severe pulmonary hypertension is new.  Myoview 07/07/16: Study Result    There was no ST segment deviation noted during stress.  Defect 1: There is a medium defect of moderate severity present in the basal inferior, mid inferior and apical inferior location.  Findings consistent with prior myocardial infarction and possible soft tissue attenuation. No significant  ischemia  This is an intermediate risk study.  The left ventricular ejection fraction is moderately decreased (30-44%).  Radioactivity activity noted in neck region on raw images. Recommend thyroid ultrasound to define       Echo 10/09/16: Study Conclusions  - Left ventricle: The cavity size was normal. Wall thickness was   increased in a pattern of moderate LVH. Systolic function was   normal. The estimated ejection fraction was in the range of 55%   to 60%. Wall motion was normal; there were no regional wall   motion abnormalities. Doppler parameters are consistent with   abnormal left ventricular relaxation (grade 1 diastolic   dysfunction).   ASSESSMENT AND PLAN:  1.  Nonsustained VT: He was asymptomatic with this in the hospital.  Now that EF has recovered no specific therapy warranted. Off Coreg due to hypotension with dialysis.  2. Chronic combined systolic and diastolic CHF: Repeat Echo in December 2017 showed normalization of EF.  I suspect some of his issues related to volume overload with ESRD that has corrected with dialysis.   3. End-stage renal disease on hemodialysis  4. Atrial flutter: He is not having any palpitations, presyncope or other symptoms from any arrhythmia. He is not a chronic anticoagulation candidate   Current medicines are reviewed at length with the patient today.  The patient does not have concerns regarding medicines.  The following changes have been made:  no change  Labs/ tests ordered today include: none No orders of the defined types were placed in this encounter.    Disposition:   FU with me in 6 months  Signed, Remington Highbaugh [name], MD  04/15/2017 2:36 PM    [facility] [address], [place], [place], [address] Phone [phone], Fax [phone]

## 2017-04-14 DIAGNOSIS — N186 End stage renal disease: Secondary | ICD-10-CM | POA: Diagnosis not present

## 2017-04-14 DIAGNOSIS — D509 Iron deficiency anemia, unspecified: Secondary | ICD-10-CM | POA: Diagnosis not present

## 2017-04-14 DIAGNOSIS — N2581 Secondary hyperparathyroidism of renal origin: Secondary | ICD-10-CM | POA: Diagnosis not present

## 2017-04-15 ENCOUNTER — Ambulatory Visit: Payer: Self-pay | Admitting: Cardiology

## 2017-04-15 ENCOUNTER — Encounter: Payer: Self-pay | Admitting: Cardiology

## 2017-04-15 ENCOUNTER — Ambulatory Visit (INDEPENDENT_AMBULATORY_CARE_PROVIDER_SITE_OTHER): Payer: Medicare Other | Admitting: Cardiology

## 2017-04-15 VITALS — BP 124/78 | HR 98 | Ht 70.0 in | Wt 182.0 lb

## 2017-04-15 DIAGNOSIS — I10 Essential (primary) hypertension: Secondary | ICD-10-CM | POA: Diagnosis not present

## 2017-04-15 DIAGNOSIS — I472 Ventricular tachycardia: Secondary | ICD-10-CM | POA: Diagnosis not present

## 2017-04-15 DIAGNOSIS — I4729 Other ventricular tachycardia: Secondary | ICD-10-CM

## 2017-04-15 DIAGNOSIS — I5042 Chronic combined systolic (congestive) and diastolic (congestive) heart failure: Secondary | ICD-10-CM | POA: Diagnosis not present

## 2017-04-15 DIAGNOSIS — R55 Syncope and collapse: Secondary | ICD-10-CM | POA: Diagnosis not present

## 2017-04-15 NOTE — Patient Instructions (Signed)
Continue your current therapy  I will see you in 6 months.   

## 2017-04-16 ENCOUNTER — Ambulatory Visit: Payer: Self-pay | Admitting: Cardiology

## 2017-04-16 DIAGNOSIS — N186 End stage renal disease: Secondary | ICD-10-CM | POA: Diagnosis not present

## 2017-04-16 DIAGNOSIS — N2581 Secondary hyperparathyroidism of renal origin: Secondary | ICD-10-CM | POA: Diagnosis not present

## 2017-04-16 DIAGNOSIS — D509 Iron deficiency anemia, unspecified: Secondary | ICD-10-CM | POA: Diagnosis not present

## 2017-04-18 DIAGNOSIS — N186 End stage renal disease: Secondary | ICD-10-CM | POA: Diagnosis not present

## 2017-04-18 DIAGNOSIS — D509 Iron deficiency anemia, unspecified: Secondary | ICD-10-CM | POA: Diagnosis not present

## 2017-04-18 DIAGNOSIS — N2581 Secondary hyperparathyroidism of renal origin: Secondary | ICD-10-CM | POA: Diagnosis not present

## 2017-04-21 DIAGNOSIS — D509 Iron deficiency anemia, unspecified: Secondary | ICD-10-CM | POA: Diagnosis not present

## 2017-04-21 DIAGNOSIS — N2581 Secondary hyperparathyroidism of renal origin: Secondary | ICD-10-CM | POA: Diagnosis not present

## 2017-04-21 DIAGNOSIS — N186 End stage renal disease: Secondary | ICD-10-CM | POA: Diagnosis not present

## 2017-04-23 DIAGNOSIS — N2581 Secondary hyperparathyroidism of renal origin: Secondary | ICD-10-CM | POA: Diagnosis not present

## 2017-04-23 DIAGNOSIS — D509 Iron deficiency anemia, unspecified: Secondary | ICD-10-CM | POA: Diagnosis not present

## 2017-04-23 DIAGNOSIS — N186 End stage renal disease: Secondary | ICD-10-CM | POA: Diagnosis not present

## 2017-04-25 DIAGNOSIS — D509 Iron deficiency anemia, unspecified: Secondary | ICD-10-CM | POA: Diagnosis not present

## 2017-04-25 DIAGNOSIS — Z992 Dependence on renal dialysis: Secondary | ICD-10-CM | POA: Diagnosis not present

## 2017-04-25 DIAGNOSIS — N2581 Secondary hyperparathyroidism of renal origin: Secondary | ICD-10-CM | POA: Diagnosis not present

## 2017-04-25 DIAGNOSIS — I129 Hypertensive chronic kidney disease with stage 1 through stage 4 chronic kidney disease, or unspecified chronic kidney disease: Secondary | ICD-10-CM | POA: Diagnosis not present

## 2017-04-25 DIAGNOSIS — N186 End stage renal disease: Secondary | ICD-10-CM | POA: Diagnosis not present

## 2017-04-28 DIAGNOSIS — D509 Iron deficiency anemia, unspecified: Secondary | ICD-10-CM | POA: Diagnosis not present

## 2017-04-28 DIAGNOSIS — N2581 Secondary hyperparathyroidism of renal origin: Secondary | ICD-10-CM | POA: Diagnosis not present

## 2017-04-28 DIAGNOSIS — N186 End stage renal disease: Secondary | ICD-10-CM | POA: Diagnosis not present

## 2017-04-30 DIAGNOSIS — N2581 Secondary hyperparathyroidism of renal origin: Secondary | ICD-10-CM | POA: Diagnosis not present

## 2017-04-30 DIAGNOSIS — N186 End stage renal disease: Secondary | ICD-10-CM | POA: Diagnosis not present

## 2017-04-30 DIAGNOSIS — D509 Iron deficiency anemia, unspecified: Secondary | ICD-10-CM | POA: Diagnosis not present

## 2017-05-02 DIAGNOSIS — N2581 Secondary hyperparathyroidism of renal origin: Secondary | ICD-10-CM | POA: Diagnosis not present

## 2017-05-02 DIAGNOSIS — D509 Iron deficiency anemia, unspecified: Secondary | ICD-10-CM | POA: Diagnosis not present

## 2017-05-02 DIAGNOSIS — N186 End stage renal disease: Secondary | ICD-10-CM | POA: Diagnosis not present

## 2017-05-05 DIAGNOSIS — D509 Iron deficiency anemia, unspecified: Secondary | ICD-10-CM | POA: Diagnosis not present

## 2017-05-05 DIAGNOSIS — N186 End stage renal disease: Secondary | ICD-10-CM | POA: Diagnosis not present

## 2017-05-05 DIAGNOSIS — N2581 Secondary hyperparathyroidism of renal origin: Secondary | ICD-10-CM | POA: Diagnosis not present

## 2017-05-07 ENCOUNTER — Encounter (INDEPENDENT_AMBULATORY_CARE_PROVIDER_SITE_OTHER): Payer: Self-pay | Admitting: Cardiology

## 2017-05-07 DIAGNOSIS — N2581 Secondary hyperparathyroidism of renal origin: Secondary | ICD-10-CM | POA: Diagnosis not present

## 2017-05-07 DIAGNOSIS — D509 Iron deficiency anemia, unspecified: Secondary | ICD-10-CM | POA: Diagnosis not present

## 2017-05-07 DIAGNOSIS — N186 End stage renal disease: Secondary | ICD-10-CM | POA: Diagnosis not present

## 2017-05-09 DIAGNOSIS — N2581 Secondary hyperparathyroidism of renal origin: Secondary | ICD-10-CM | POA: Diagnosis not present

## 2017-05-09 DIAGNOSIS — D509 Iron deficiency anemia, unspecified: Secondary | ICD-10-CM | POA: Diagnosis not present

## 2017-05-09 DIAGNOSIS — N186 End stage renal disease: Secondary | ICD-10-CM | POA: Diagnosis not present

## 2017-05-12 DIAGNOSIS — N2581 Secondary hyperparathyroidism of renal origin: Secondary | ICD-10-CM | POA: Diagnosis not present

## 2017-05-12 DIAGNOSIS — N186 End stage renal disease: Secondary | ICD-10-CM | POA: Diagnosis not present

## 2017-05-12 DIAGNOSIS — D509 Iron deficiency anemia, unspecified: Secondary | ICD-10-CM | POA: Diagnosis not present

## 2017-05-14 DIAGNOSIS — N186 End stage renal disease: Secondary | ICD-10-CM | POA: Diagnosis not present

## 2017-05-14 DIAGNOSIS — N2581 Secondary hyperparathyroidism of renal origin: Secondary | ICD-10-CM | POA: Diagnosis not present

## 2017-05-14 DIAGNOSIS — D509 Iron deficiency anemia, unspecified: Secondary | ICD-10-CM | POA: Diagnosis not present

## 2017-05-16 DIAGNOSIS — N186 End stage renal disease: Secondary | ICD-10-CM | POA: Diagnosis not present

## 2017-05-16 DIAGNOSIS — D509 Iron deficiency anemia, unspecified: Secondary | ICD-10-CM | POA: Diagnosis not present

## 2017-05-16 DIAGNOSIS — N2581 Secondary hyperparathyroidism of renal origin: Secondary | ICD-10-CM | POA: Diagnosis not present

## 2017-05-19 DIAGNOSIS — N186 End stage renal disease: Secondary | ICD-10-CM | POA: Diagnosis not present

## 2017-05-19 DIAGNOSIS — D509 Iron deficiency anemia, unspecified: Secondary | ICD-10-CM | POA: Diagnosis not present

## 2017-05-19 DIAGNOSIS — N2581 Secondary hyperparathyroidism of renal origin: Secondary | ICD-10-CM | POA: Diagnosis not present

## 2017-05-21 DIAGNOSIS — D509 Iron deficiency anemia, unspecified: Secondary | ICD-10-CM | POA: Diagnosis not present

## 2017-05-21 DIAGNOSIS — N2581 Secondary hyperparathyroidism of renal origin: Secondary | ICD-10-CM | POA: Diagnosis not present

## 2017-05-21 DIAGNOSIS — N186 End stage renal disease: Secondary | ICD-10-CM | POA: Diagnosis not present

## 2017-05-23 DIAGNOSIS — N2581 Secondary hyperparathyroidism of renal origin: Secondary | ICD-10-CM | POA: Diagnosis not present

## 2017-05-23 DIAGNOSIS — D509 Iron deficiency anemia, unspecified: Secondary | ICD-10-CM | POA: Diagnosis not present

## 2017-05-23 DIAGNOSIS — N186 End stage renal disease: Secondary | ICD-10-CM | POA: Diagnosis not present

## 2017-05-26 DIAGNOSIS — I129 Hypertensive chronic kidney disease with stage 1 through stage 4 chronic kidney disease, or unspecified chronic kidney disease: Secondary | ICD-10-CM | POA: Diagnosis not present

## 2017-05-26 DIAGNOSIS — N2581 Secondary hyperparathyroidism of renal origin: Secondary | ICD-10-CM | POA: Diagnosis not present

## 2017-05-26 DIAGNOSIS — Z992 Dependence on renal dialysis: Secondary | ICD-10-CM | POA: Diagnosis not present

## 2017-05-26 DIAGNOSIS — N186 End stage renal disease: Secondary | ICD-10-CM | POA: Diagnosis not present

## 2017-05-26 DIAGNOSIS — D509 Iron deficiency anemia, unspecified: Secondary | ICD-10-CM | POA: Diagnosis not present

## 2017-05-28 DIAGNOSIS — N186 End stage renal disease: Secondary | ICD-10-CM | POA: Diagnosis not present

## 2017-05-28 DIAGNOSIS — D631 Anemia in chronic kidney disease: Secondary | ICD-10-CM | POA: Diagnosis not present

## 2017-05-28 DIAGNOSIS — D509 Iron deficiency anemia, unspecified: Secondary | ICD-10-CM | POA: Diagnosis not present

## 2017-05-28 DIAGNOSIS — N2581 Secondary hyperparathyroidism of renal origin: Secondary | ICD-10-CM | POA: Diagnosis not present

## 2017-05-30 DIAGNOSIS — D631 Anemia in chronic kidney disease: Secondary | ICD-10-CM | POA: Diagnosis not present

## 2017-05-30 DIAGNOSIS — D509 Iron deficiency anemia, unspecified: Secondary | ICD-10-CM | POA: Diagnosis not present

## 2017-05-30 DIAGNOSIS — N2581 Secondary hyperparathyroidism of renal origin: Secondary | ICD-10-CM | POA: Diagnosis not present

## 2017-05-30 DIAGNOSIS — N186 End stage renal disease: Secondary | ICD-10-CM | POA: Diagnosis not present

## 2017-06-02 DIAGNOSIS — N186 End stage renal disease: Secondary | ICD-10-CM | POA: Diagnosis not present

## 2017-06-02 DIAGNOSIS — D509 Iron deficiency anemia, unspecified: Secondary | ICD-10-CM | POA: Diagnosis not present

## 2017-06-02 DIAGNOSIS — N2581 Secondary hyperparathyroidism of renal origin: Secondary | ICD-10-CM | POA: Diagnosis not present

## 2017-06-02 DIAGNOSIS — D631 Anemia in chronic kidney disease: Secondary | ICD-10-CM | POA: Diagnosis not present

## 2017-06-04 DIAGNOSIS — D631 Anemia in chronic kidney disease: Secondary | ICD-10-CM | POA: Diagnosis not present

## 2017-06-04 DIAGNOSIS — N2581 Secondary hyperparathyroidism of renal origin: Secondary | ICD-10-CM | POA: Diagnosis not present

## 2017-06-04 DIAGNOSIS — D509 Iron deficiency anemia, unspecified: Secondary | ICD-10-CM | POA: Diagnosis not present

## 2017-06-04 DIAGNOSIS — N186 End stage renal disease: Secondary | ICD-10-CM | POA: Diagnosis not present

## 2017-06-06 DIAGNOSIS — N2581 Secondary hyperparathyroidism of renal origin: Secondary | ICD-10-CM | POA: Diagnosis not present

## 2017-06-06 DIAGNOSIS — D509 Iron deficiency anemia, unspecified: Secondary | ICD-10-CM | POA: Diagnosis not present

## 2017-06-06 DIAGNOSIS — N186 End stage renal disease: Secondary | ICD-10-CM | POA: Diagnosis not present

## 2017-06-06 DIAGNOSIS — D631 Anemia in chronic kidney disease: Secondary | ICD-10-CM | POA: Diagnosis not present

## 2017-06-09 DIAGNOSIS — N186 End stage renal disease: Secondary | ICD-10-CM | POA: Diagnosis not present

## 2017-06-09 DIAGNOSIS — N2581 Secondary hyperparathyroidism of renal origin: Secondary | ICD-10-CM | POA: Diagnosis not present

## 2017-06-09 DIAGNOSIS — D631 Anemia in chronic kidney disease: Secondary | ICD-10-CM | POA: Diagnosis not present

## 2017-06-09 DIAGNOSIS — D509 Iron deficiency anemia, unspecified: Secondary | ICD-10-CM | POA: Diagnosis not present

## 2017-06-11 DIAGNOSIS — D509 Iron deficiency anemia, unspecified: Secondary | ICD-10-CM | POA: Diagnosis not present

## 2017-06-11 DIAGNOSIS — N2581 Secondary hyperparathyroidism of renal origin: Secondary | ICD-10-CM | POA: Diagnosis not present

## 2017-06-11 DIAGNOSIS — D631 Anemia in chronic kidney disease: Secondary | ICD-10-CM | POA: Diagnosis not present

## 2017-06-11 DIAGNOSIS — N186 End stage renal disease: Secondary | ICD-10-CM | POA: Diagnosis not present

## 2017-06-13 DIAGNOSIS — N186 End stage renal disease: Secondary | ICD-10-CM | POA: Diagnosis not present

## 2017-06-13 DIAGNOSIS — D509 Iron deficiency anemia, unspecified: Secondary | ICD-10-CM | POA: Diagnosis not present

## 2017-06-13 DIAGNOSIS — N2581 Secondary hyperparathyroidism of renal origin: Secondary | ICD-10-CM | POA: Diagnosis not present

## 2017-06-13 DIAGNOSIS — D631 Anemia in chronic kidney disease: Secondary | ICD-10-CM | POA: Diagnosis not present

## 2017-06-16 DIAGNOSIS — D509 Iron deficiency anemia, unspecified: Secondary | ICD-10-CM | POA: Diagnosis not present

## 2017-06-16 DIAGNOSIS — D631 Anemia in chronic kidney disease: Secondary | ICD-10-CM | POA: Diagnosis not present

## 2017-06-16 DIAGNOSIS — N2581 Secondary hyperparathyroidism of renal origin: Secondary | ICD-10-CM | POA: Diagnosis not present

## 2017-06-16 DIAGNOSIS — N186 End stage renal disease: Secondary | ICD-10-CM | POA: Diagnosis not present

## 2017-06-18 DIAGNOSIS — D509 Iron deficiency anemia, unspecified: Secondary | ICD-10-CM | POA: Diagnosis not present

## 2017-06-18 DIAGNOSIS — D631 Anemia in chronic kidney disease: Secondary | ICD-10-CM | POA: Diagnosis not present

## 2017-06-18 DIAGNOSIS — N186 End stage renal disease: Secondary | ICD-10-CM | POA: Diagnosis not present

## 2017-06-18 DIAGNOSIS — N2581 Secondary hyperparathyroidism of renal origin: Secondary | ICD-10-CM | POA: Diagnosis not present

## 2017-06-20 DIAGNOSIS — D631 Anemia in chronic kidney disease: Secondary | ICD-10-CM | POA: Diagnosis not present

## 2017-06-20 DIAGNOSIS — N186 End stage renal disease: Secondary | ICD-10-CM | POA: Diagnosis not present

## 2017-06-20 DIAGNOSIS — D509 Iron deficiency anemia, unspecified: Secondary | ICD-10-CM | POA: Diagnosis not present

## 2017-06-20 DIAGNOSIS — N2581 Secondary hyperparathyroidism of renal origin: Secondary | ICD-10-CM | POA: Diagnosis not present

## 2017-06-23 DIAGNOSIS — D509 Iron deficiency anemia, unspecified: Secondary | ICD-10-CM | POA: Diagnosis not present

## 2017-06-23 DIAGNOSIS — N186 End stage renal disease: Secondary | ICD-10-CM | POA: Diagnosis not present

## 2017-06-23 DIAGNOSIS — N2581 Secondary hyperparathyroidism of renal origin: Secondary | ICD-10-CM | POA: Diagnosis not present

## 2017-06-23 DIAGNOSIS — D631 Anemia in chronic kidney disease: Secondary | ICD-10-CM | POA: Diagnosis not present

## 2017-06-25 DIAGNOSIS — N2581 Secondary hyperparathyroidism of renal origin: Secondary | ICD-10-CM | POA: Diagnosis not present

## 2017-06-25 DIAGNOSIS — D631 Anemia in chronic kidney disease: Secondary | ICD-10-CM | POA: Diagnosis not present

## 2017-06-25 DIAGNOSIS — D509 Iron deficiency anemia, unspecified: Secondary | ICD-10-CM | POA: Diagnosis not present

## 2017-06-25 DIAGNOSIS — N186 End stage renal disease: Secondary | ICD-10-CM | POA: Diagnosis not present

## 2017-06-26 DIAGNOSIS — Z992 Dependence on renal dialysis: Secondary | ICD-10-CM | POA: Diagnosis not present

## 2017-06-26 DIAGNOSIS — I129 Hypertensive chronic kidney disease with stage 1 through stage 4 chronic kidney disease, or unspecified chronic kidney disease: Secondary | ICD-10-CM | POA: Diagnosis not present

## 2017-06-26 DIAGNOSIS — N186 End stage renal disease: Secondary | ICD-10-CM | POA: Diagnosis not present

## 2017-06-27 DIAGNOSIS — N2581 Secondary hyperparathyroidism of renal origin: Secondary | ICD-10-CM | POA: Diagnosis not present

## 2017-06-27 DIAGNOSIS — D509 Iron deficiency anemia, unspecified: Secondary | ICD-10-CM | POA: Diagnosis not present

## 2017-06-27 DIAGNOSIS — N186 End stage renal disease: Secondary | ICD-10-CM | POA: Diagnosis not present

## 2017-06-27 DIAGNOSIS — D631 Anemia in chronic kidney disease: Secondary | ICD-10-CM | POA: Diagnosis not present

## 2017-06-27 DIAGNOSIS — Z23 Encounter for immunization: Secondary | ICD-10-CM | POA: Diagnosis not present

## 2017-06-30 DIAGNOSIS — D509 Iron deficiency anemia, unspecified: Secondary | ICD-10-CM | POA: Diagnosis not present

## 2017-06-30 DIAGNOSIS — Z23 Encounter for immunization: Secondary | ICD-10-CM | POA: Diagnosis not present

## 2017-06-30 DIAGNOSIS — D631 Anemia in chronic kidney disease: Secondary | ICD-10-CM | POA: Diagnosis not present

## 2017-06-30 DIAGNOSIS — N2581 Secondary hyperparathyroidism of renal origin: Secondary | ICD-10-CM | POA: Diagnosis not present

## 2017-06-30 DIAGNOSIS — N186 End stage renal disease: Secondary | ICD-10-CM | POA: Diagnosis not present

## 2017-07-02 DIAGNOSIS — D631 Anemia in chronic kidney disease: Secondary | ICD-10-CM | POA: Diagnosis not present

## 2017-07-02 DIAGNOSIS — Z23 Encounter for immunization: Secondary | ICD-10-CM | POA: Diagnosis not present

## 2017-07-02 DIAGNOSIS — N186 End stage renal disease: Secondary | ICD-10-CM | POA: Diagnosis not present

## 2017-07-02 DIAGNOSIS — N2581 Secondary hyperparathyroidism of renal origin: Secondary | ICD-10-CM | POA: Diagnosis not present

## 2017-07-02 DIAGNOSIS — D509 Iron deficiency anemia, unspecified: Secondary | ICD-10-CM | POA: Diagnosis not present

## 2017-07-04 DIAGNOSIS — D509 Iron deficiency anemia, unspecified: Secondary | ICD-10-CM | POA: Diagnosis not present

## 2017-07-04 DIAGNOSIS — N186 End stage renal disease: Secondary | ICD-10-CM | POA: Diagnosis not present

## 2017-07-04 DIAGNOSIS — D631 Anemia in chronic kidney disease: Secondary | ICD-10-CM | POA: Diagnosis not present

## 2017-07-04 DIAGNOSIS — Z23 Encounter for immunization: Secondary | ICD-10-CM | POA: Diagnosis not present

## 2017-07-04 DIAGNOSIS — N2581 Secondary hyperparathyroidism of renal origin: Secondary | ICD-10-CM | POA: Diagnosis not present

## 2017-07-07 DIAGNOSIS — Z23 Encounter for immunization: Secondary | ICD-10-CM | POA: Diagnosis not present

## 2017-07-07 DIAGNOSIS — D509 Iron deficiency anemia, unspecified: Secondary | ICD-10-CM | POA: Diagnosis not present

## 2017-07-07 DIAGNOSIS — D631 Anemia in chronic kidney disease: Secondary | ICD-10-CM | POA: Diagnosis not present

## 2017-07-07 DIAGNOSIS — N2581 Secondary hyperparathyroidism of renal origin: Secondary | ICD-10-CM | POA: Diagnosis not present

## 2017-07-07 DIAGNOSIS — N186 End stage renal disease: Secondary | ICD-10-CM | POA: Diagnosis not present

## 2017-07-09 DIAGNOSIS — N186 End stage renal disease: Secondary | ICD-10-CM | POA: Diagnosis not present

## 2017-07-09 DIAGNOSIS — D631 Anemia in chronic kidney disease: Secondary | ICD-10-CM | POA: Diagnosis not present

## 2017-07-09 DIAGNOSIS — Z23 Encounter for immunization: Secondary | ICD-10-CM | POA: Diagnosis not present

## 2017-07-09 DIAGNOSIS — N2581 Secondary hyperparathyroidism of renal origin: Secondary | ICD-10-CM | POA: Diagnosis not present

## 2017-07-09 DIAGNOSIS — D509 Iron deficiency anemia, unspecified: Secondary | ICD-10-CM | POA: Diagnosis not present

## 2017-07-11 DIAGNOSIS — N2581 Secondary hyperparathyroidism of renal origin: Secondary | ICD-10-CM | POA: Diagnosis not present

## 2017-07-11 DIAGNOSIS — D509 Iron deficiency anemia, unspecified: Secondary | ICD-10-CM | POA: Diagnosis not present

## 2017-07-11 DIAGNOSIS — Z23 Encounter for immunization: Secondary | ICD-10-CM | POA: Diagnosis not present

## 2017-07-11 DIAGNOSIS — D631 Anemia in chronic kidney disease: Secondary | ICD-10-CM | POA: Diagnosis not present

## 2017-07-11 DIAGNOSIS — N186 End stage renal disease: Secondary | ICD-10-CM | POA: Diagnosis not present

## 2017-07-14 DIAGNOSIS — N186 End stage renal disease: Secondary | ICD-10-CM | POA: Diagnosis not present

## 2017-07-14 DIAGNOSIS — D631 Anemia in chronic kidney disease: Secondary | ICD-10-CM | POA: Diagnosis not present

## 2017-07-14 DIAGNOSIS — N2581 Secondary hyperparathyroidism of renal origin: Secondary | ICD-10-CM | POA: Diagnosis not present

## 2017-07-14 DIAGNOSIS — D509 Iron deficiency anemia, unspecified: Secondary | ICD-10-CM | POA: Diagnosis not present

## 2017-07-14 DIAGNOSIS — Z23 Encounter for immunization: Secondary | ICD-10-CM | POA: Diagnosis not present

## 2017-07-16 DIAGNOSIS — N2581 Secondary hyperparathyroidism of renal origin: Secondary | ICD-10-CM | POA: Diagnosis not present

## 2017-07-16 DIAGNOSIS — N186 End stage renal disease: Secondary | ICD-10-CM | POA: Diagnosis not present

## 2017-07-16 DIAGNOSIS — Z23 Encounter for immunization: Secondary | ICD-10-CM | POA: Diagnosis not present

## 2017-07-16 DIAGNOSIS — D509 Iron deficiency anemia, unspecified: Secondary | ICD-10-CM | POA: Diagnosis not present

## 2017-07-16 DIAGNOSIS — D631 Anemia in chronic kidney disease: Secondary | ICD-10-CM | POA: Diagnosis not present

## 2017-07-18 DIAGNOSIS — D631 Anemia in chronic kidney disease: Secondary | ICD-10-CM | POA: Diagnosis not present

## 2017-07-18 DIAGNOSIS — N2581 Secondary hyperparathyroidism of renal origin: Secondary | ICD-10-CM | POA: Diagnosis not present

## 2017-07-18 DIAGNOSIS — Z23 Encounter for immunization: Secondary | ICD-10-CM | POA: Diagnosis not present

## 2017-07-18 DIAGNOSIS — D509 Iron deficiency anemia, unspecified: Secondary | ICD-10-CM | POA: Diagnosis not present

## 2017-07-18 DIAGNOSIS — N186 End stage renal disease: Secondary | ICD-10-CM | POA: Diagnosis not present

## 2017-07-21 DIAGNOSIS — N2581 Secondary hyperparathyroidism of renal origin: Secondary | ICD-10-CM | POA: Diagnosis not present

## 2017-07-21 DIAGNOSIS — N186 End stage renal disease: Secondary | ICD-10-CM | POA: Diagnosis not present

## 2017-07-21 DIAGNOSIS — D631 Anemia in chronic kidney disease: Secondary | ICD-10-CM | POA: Diagnosis not present

## 2017-07-21 DIAGNOSIS — D509 Iron deficiency anemia, unspecified: Secondary | ICD-10-CM | POA: Diagnosis not present

## 2017-07-21 DIAGNOSIS — Z23 Encounter for immunization: Secondary | ICD-10-CM | POA: Diagnosis not present

## 2017-07-23 DIAGNOSIS — Z23 Encounter for immunization: Secondary | ICD-10-CM | POA: Diagnosis not present

## 2017-07-23 DIAGNOSIS — N186 End stage renal disease: Secondary | ICD-10-CM | POA: Diagnosis not present

## 2017-07-23 DIAGNOSIS — D631 Anemia in chronic kidney disease: Secondary | ICD-10-CM | POA: Diagnosis not present

## 2017-07-23 DIAGNOSIS — N2581 Secondary hyperparathyroidism of renal origin: Secondary | ICD-10-CM | POA: Diagnosis not present

## 2017-07-23 DIAGNOSIS — D509 Iron deficiency anemia, unspecified: Secondary | ICD-10-CM | POA: Diagnosis not present

## 2017-07-25 DIAGNOSIS — N2581 Secondary hyperparathyroidism of renal origin: Secondary | ICD-10-CM | POA: Diagnosis not present

## 2017-07-25 DIAGNOSIS — Z23 Encounter for immunization: Secondary | ICD-10-CM | POA: Diagnosis not present

## 2017-07-25 DIAGNOSIS — D631 Anemia in chronic kidney disease: Secondary | ICD-10-CM | POA: Diagnosis not present

## 2017-07-25 DIAGNOSIS — D509 Iron deficiency anemia, unspecified: Secondary | ICD-10-CM | POA: Diagnosis not present

## 2017-07-25 DIAGNOSIS — N186 End stage renal disease: Secondary | ICD-10-CM | POA: Diagnosis not present

## 2017-07-26 DIAGNOSIS — I129 Hypertensive chronic kidney disease with stage 1 through stage 4 chronic kidney disease, or unspecified chronic kidney disease: Secondary | ICD-10-CM | POA: Diagnosis not present

## 2017-07-26 DIAGNOSIS — Z992 Dependence on renal dialysis: Secondary | ICD-10-CM | POA: Diagnosis not present

## 2017-07-26 DIAGNOSIS — N186 End stage renal disease: Secondary | ICD-10-CM | POA: Diagnosis not present

## 2017-07-28 ENCOUNTER — Emergency Department (HOSPITAL_COMMUNITY)
Admission: EM | Admit: 2017-07-28 | Discharge: 2017-07-28 | Disposition: A | Discharge status: Home / Self Care | Payer: Medicare Other | Attending: Emergency Medicine | Admitting: Emergency Medicine

## 2017-07-28 ENCOUNTER — Encounter (HOSPITAL_COMMUNITY): Payer: Self-pay

## 2017-07-28 ENCOUNTER — Emergency Department (HOSPITAL_COMMUNITY): Payer: Medicare Other

## 2017-07-28 DIAGNOSIS — R918 Other nonspecific abnormal finding of lung field: Secondary | ICD-10-CM | POA: Diagnosis not present

## 2017-07-28 DIAGNOSIS — I130 Hypertensive heart and chronic kidney disease with heart failure and stage 1 through stage 4 chronic kidney disease, or unspecified chronic kidney disease: Secondary | ICD-10-CM | POA: Diagnosis not present

## 2017-07-28 DIAGNOSIS — I5042 Chronic combined systolic (congestive) and diastolic (congestive) heart failure: Secondary | ICD-10-CM | POA: Diagnosis not present

## 2017-07-28 DIAGNOSIS — I9589 Other hypotension: Secondary | ICD-10-CM | POA: Insufficient documentation

## 2017-07-28 DIAGNOSIS — Z992 Dependence on renal dialysis: Secondary | ICD-10-CM | POA: Diagnosis not present

## 2017-07-28 DIAGNOSIS — E861 Hypovolemia: Secondary | ICD-10-CM | POA: Diagnosis not present

## 2017-07-28 DIAGNOSIS — Z79899 Other long term (current) drug therapy: Secondary | ICD-10-CM | POA: Diagnosis not present

## 2017-07-28 DIAGNOSIS — N186 End stage renal disease: Secondary | ICD-10-CM | POA: Diagnosis not present

## 2017-07-28 DIAGNOSIS — D509 Iron deficiency anemia, unspecified: Secondary | ICD-10-CM | POA: Diagnosis not present

## 2017-07-28 DIAGNOSIS — N184 Chronic kidney disease, stage 4 (severe): Secondary | ICD-10-CM | POA: Diagnosis not present

## 2017-07-28 DIAGNOSIS — Z87891 Personal history of nicotine dependence: Secondary | ICD-10-CM | POA: Insufficient documentation

## 2017-07-28 DIAGNOSIS — N2581 Secondary hyperparathyroidism of renal origin: Secondary | ICD-10-CM | POA: Diagnosis not present

## 2017-07-28 DIAGNOSIS — R404 Transient alteration of awareness: Secondary | ICD-10-CM | POA: Diagnosis not present

## 2017-07-28 DIAGNOSIS — R55 Syncope and collapse: Secondary | ICD-10-CM | POA: Diagnosis not present

## 2017-07-28 LAB — BASIC METABOLIC PANEL
ANION GAP: 10 (ref 5–15)
BUN: 31 mg/dL — ABNORMAL HIGH (ref 6–20)
CALCIUM: 8.5 mg/dL — AB (ref 8.9–10.3)
CO2: 28 mmol/L (ref 22–32)
CREATININE: 7.79 mg/dL — AB (ref 0.61–1.24)
Chloride: 96 mmol/L — ABNORMAL LOW (ref 101–111)
GFR, EST AFRICAN AMERICAN: 8 mL/min — AB (ref >60)
GFR, EST NON AFRICAN AMERICAN: 7 mL/min — AB (ref >60)
Glucose, Bld: 127 mg/dL — ABNORMAL HIGH (ref 65–99)
Potassium: 3.7 mmol/L (ref 3.5–5.1)
Sodium: 134 mmol/L — ABNORMAL LOW (ref 135–145)

## 2017-07-28 LAB — I-STAT TROPONIN, ED: Troponin i, poc: 0 ng/mL (ref 0.00–0.08)

## 2017-07-28 LAB — CBC
HCT: 38.4 % — ABNORMAL LOW (ref 39.0–52.0)
HEMOGLOBIN: 13.4 g/dL (ref 13.0–17.0)
MCH: 34.1 pg — ABNORMAL HIGH (ref 26.0–34.0)
MCHC: 34.9 g/dL (ref 30.0–36.0)
MCV: 97.7 fL (ref 78.0–100.0)
PLATELETS: 238 10*3/uL (ref 150–400)
RBC: 3.93 MIL/uL — AB (ref 4.22–5.81)
RDW: 13.9 % (ref 11.5–15.5)
WBC: 7.4 10*3/uL (ref 4.0–10.5)

## 2017-07-28 LAB — CBG MONITORING, ED: GLUCOSE-CAPILLARY: 114 mg/dL — AB (ref 65–99)

## 2017-07-28 LAB — I-STAT CG4 LACTIC ACID, ED: LACTIC ACID, VENOUS: 1.97 mmol/L — AB (ref 0.5–1.9)

## 2017-07-28 LAB — ETHANOL: Alcohol, Ethyl (B): <10 mg/dL (ref <10)

## 2017-07-28 NOTE — ED Provider Notes (Signed)
[facility] DEPT Provider Note   CSN: [id number] Arrival date & time: 07/28/17  1657     History   Chief Complaint Chief Complaint  Patient presents with  . Loss of Consciousness  . Emesis  . Aspiration    HPI Kenneth [name] is a 61 y.o. male.  HPI Patient was in dialysis when he became hypotensive. He reportedly received the full treatment but in the last 30 minutes systolic blood pressures dropped to 80s. Patient was placed in Trendelenburg and became nauseated and vomited. On EMS arrival patient reportedly was 70% on room air. Supplemental oxygen patient return to normal during transport and had no acute complaints. During my assessment of the patient, he was initially somnolent but had no acute complaints. He did not endorse any pain. He didn't remember what happened at dialysis. Past Medical History:  Diagnosis Date  . Cardiomyopathy secondary    likely related to HTN heart disease; possibly ETOH related as well  . Chronic combined systolic and diastolic heart failure (HCC)    Echocardiogram 09/22/11: Moderate LVH, EF 01-74%, grade 3 diastolic dysfunction, mild MR, moderate to severe LAE, mild RVE, mild to moderate TR, small to moderate pericardial effusion  . CKD (chronic kidney disease) stage 4, GFR 15-29 ml/min (HCC)    renal failure; due to hypertensive nephrosclerosis  . History of alcohol abuse   . Hypertension     Patient Active Problem List   Diagnosis Date Noted  . Hypothermia 10/08/2016  . ESRD on hemodialysis ([facility]) 10/08/2016  . Fall 10/08/2016  . Syncope 10/07/2016  . Near syncope 07/07/2016  . Cardiomyopathy ([facility]) 07/07/2016  . NSVT (nonsustained ventricular tachycardia) ([facility]) 07/06/2016  . Hypoglycemia 07/06/2016  . Alcohol abuse 05/13/2016  . ESRD (end stage renal disease) ([facility]) 05/12/2016  . Hypertensive urgency 12/08/2013  . Cardiomyopathy secondary 10/09/2011  . Essential hypertension 10/09/2011  . Alcohol withdrawal delirium ([facility]) 09/25/2011    . Systolic and diastolic CHF, chronic ([facility]) 09/24/2011    Past Surgical History:  Procedure Laterality Date  . AV FISTULA PLACEMENT Left 05/14/2016   Procedure: LEFT ARM BASILIC VEIN TRANSPOSITION;  Surgeon: Rosetta Posner, MD;  Location: [facility];  Service: Vascular;  Laterality: Left;  . PERIPHERAL VASCULAR CATHETERIZATION N/A 05/13/2016   Procedure: Dialysis/Perma Catheter Insertion;  Surgeon: Serafina Mitchell, MD;  Location: [facility];  Service: Cardiovascular;  Laterality: N/A;       Home Medications    Prior to Admission medications   Medication Sig Start Date End Date Taking? Authorizing Provider  amLODipine (NORVASC) 10 MG tablet Take 10 mg by mouth at bedtime.   Yes [provider]  calcium acetate (PHOSLO) 667 MG capsule Take 1,334-2,001 mg by mouth See admin instructions. 2,001 mg three times a day with meals and 1,334 mg two times a day with snacks   Yes [provider]  diphenhydramine-acetaminophen (TYLENOL PM) 25-500 MG TABS tablet Take 1 tablet by mouth at bedtime as needed (for sleep).   Yes [provider]  acetaminophen (TYLENOL) 325 MG tablet Take 2 tablets (650 mg total) by mouth every 6 (six) hours as needed for mild pain (or Fever >/= 101). Patient not taking: Reported on 07/28/2017 10/11/16   Domenic Polite, MD    Family History Family History  Problem Relation Age of Onset  . Emphysema Mother   . Cirrhosis Father     Social History Social History  Substance Use Topics  . Smoking status: Former Audiological scientist  date: 08/15/2014  . Smokeless tobacco: Current User  . Alcohol use 1.8 oz/week    3 Cans of beer per week     Comment: occ     Allergies   Patient has no known allergies.   Review of Systems Review of Systems 10 Systems reviewed and are negative for acute change except as noted in the HPI.   Physical Exam Updated Vital Signs BP (!) 176/100   Pulse 86   Temp 97.6 F (36.4 C) (Oral)   Resp (!) 21   Ht  6' (1.829 m)   Wt 95.3 kg (210 lb)   SpO2 95%   BMI 28.48 kg/m   Physical Exam  Constitutional: He appears well-developed and well-nourished. No distress.  Patient is in no acute distress. He is however very somnolent. He answers some questions but goes back to sleep.  HENT:  Head: Normocephalic and atraumatic.  Nose: Nose normal.  Mouth/Throat: Oropharynx is clear and moist.  Eyes: EOM are normal.  Pupils are 2 mm and responsive.  Neck: Neck supple.  Cardiovascular: Normal rate, regular rhythm and normal heart sounds.   Pulmonary/Chest: Effort normal and breath sounds normal.  Abdominal: Soft. He exhibits no distension. There is no tenderness. There is no guarding.  Musculoskeletal: Normal range of motion. He exhibits no edema, tenderness or deformity.  Neurological:  Patient is somnolent but answers questions. No focal neurologic deficit. Performs grip strength and elevates legs independently.  Skin: Skin is warm and dry.     ED Treatments / Results  Labs (all labs ordered are listed, but only abnormal results are displayed) Labs Reviewed  BASIC METABOLIC PANEL - Abnormal; Notable for the following:       Result Value   Sodium 134 (*)    Chloride 96 (*)    Glucose, Bld 127 (*)    BUN 31 (*)    Creatinine, Ser 7.79 (*)    Calcium 8.5 (*)    GFR calc non Af Amer 7 (*)    GFR calc Af Amer 8 (*)    All other components within normal limits  CBC - Abnormal; Notable for the following:    RBC 3.93 (*)    HCT 38.4 (*)    MCH 34.1 (*)    All other components within normal limits  CBG MONITORING, ED - Abnormal; Notable for the following:    Glucose-Capillary 114 (*)    All other components within normal limits  I-STAT CG4 LACTIC ACID, ED - Abnormal; Notable for the following:    Lactic Acid, Venous 1.97 (*)    All other components within normal limits  ETHANOL  URINALYSIS, ROUTINE W REFLEX MICROSCOPIC  I-STAT TROPONIN, ED    EKG  EKG  Interpretation  Date/Time:  Tuesday July 28 2017 17:00:08 EDT Ventricular Rate:  73 PR Interval:    QRS Duration: 121 QT Interval:  433 QTC Calculation: 478 R Axis:   20 Text Interpretation:  Sinus rhythm Multiple premature complexes, vent & supraven Left ventricular hypertrophy Nonspecific T abnormalities, lateral leads ST elevation, consider anterior injury Borderline prolonged QT interval Confirmed by Charlesetta Shanks [phone]) on 07/28/2017 10:08:18 PM       Radiology Dg Chest 2 View  Result Date: 07/28/2017 CLINICAL DATA:  End-stage renal disease, on dialysis, now with hypotension. Concern for aspiration. EXAM: CHEST  2 VIEW COMPARISON:  10/07/2016; 07/05/2016 FINDINGS: Grossly unchanged cardiac silhouette and mediastinal contours given reduced lung volumes. Interval removal of right jugular approach dialysis catheter. Mild  pulmonary venous congestion without frank evidence of edema. Bilateral infrahilar heterogeneous opacities, left greater than right. No pleural effusion or pneumothorax. No acute osseus abnormalities. IMPRESSION: Worsening bibasilar opacities, left greater than right, atelectasis versus infiltrate/aspiration. Continued attention on follow-up is recommended. Electronically Signed   By: Sandi Mariscal M.D.   On: 07/28/2017 19:14    Procedures Procedures (including critical care time)  Medications Ordered in ED Medications - No data to display   Initial Impression / Assessment and Plan / ED Course  I have reviewed the triage vital signs and the nursing notes.  Pertinent labs & imaging results that were available during my care of the patient were reviewed by me and considered in my medical decision making (see chart for details).    Recheck: (21:55) patient is now alert and in no distress. He is watching television. Mental status is clear. Respirations are nonlabored. He has no complaints.  Final Clinical Impressions(s) / ED Diagnoses   Final diagnoses:   Hypotension due to hypovolemia  ESRD (end stage renal disease) on dialysis [facility])  Syncope and collapse  Patient reportedly became very hypotensive at the end of dialysis session. It appears he had a syncopal episode likely due to hypovolemia. Patient was volume resuscitated by EMS. Initially on arrival to the emergency department the patient was very somnolent but with nonfocal neurologic examination. Diagnostic studies do not show acute change from baseline. After period of observation patient has become alert and appropriate. No distress. I most suspect acute changes were due to hypovolemia and dialysis and patient at this time is rebounded and is back to baseline. Stable for discharge. He has no respiratory distress and no complaints of chest pain.  New Prescriptions New Prescriptions   No medications on file     Charlesetta Shanks, MD 07/28/17 2212

## 2017-07-28 NOTE — ED Notes (Signed)
ED Provider at bedside. 

## 2017-07-28 NOTE — ED Notes (Signed)
Iv team a bedside to de access dialysis port

## 2017-07-28 NOTE — ED Notes (Signed)
Patient transported to X-ray 

## 2017-07-28 NOTE — ED Triage Notes (Signed)
Pt BIB [facility] from dialysis c.o hypotension, syncope and possible aspiration. Pt received full tx at dialysis today but during the last 30 mins pt became hypotensive 43H systolic. Staff placed pt in trendelenburg position when pt suddenly became nauseous, vomited x1 while still in trendelenburg. Pt also had a syncopal episode during this time for about 30 seconds. Per EMS rhonchi noted in all lung fields, pt was 78% on room air, after 15L applied by non rebreather pt came up to 83% and with EMS 94% on 2L [place]. Pt arrives to ED a.ox4, no complaints.

## 2017-07-28 NOTE — ED Notes (Signed)
Pt verbalized understanding of discharge instructions, no further questions

## 2017-07-28 NOTE — ED Notes (Signed)
Pt provided with urinal to provide specimen, pt lethargic and drowsy. Lights turned on to encourage compliance.

## 2017-07-28 NOTE — ED Notes (Signed)
Pt daughter called to come pick up the pt

## 2017-07-30 DIAGNOSIS — D509 Iron deficiency anemia, unspecified: Secondary | ICD-10-CM | POA: Diagnosis not present

## 2017-07-30 DIAGNOSIS — N186 End stage renal disease: Secondary | ICD-10-CM | POA: Diagnosis not present

## 2017-07-30 DIAGNOSIS — N2581 Secondary hyperparathyroidism of renal origin: Secondary | ICD-10-CM | POA: Diagnosis not present

## 2017-08-01 DIAGNOSIS — N2581 Secondary hyperparathyroidism of renal origin: Secondary | ICD-10-CM | POA: Diagnosis not present

## 2017-08-01 DIAGNOSIS — N186 End stage renal disease: Secondary | ICD-10-CM | POA: Diagnosis not present

## 2017-08-01 DIAGNOSIS — D509 Iron deficiency anemia, unspecified: Secondary | ICD-10-CM | POA: Diagnosis not present

## 2017-08-04 DIAGNOSIS — N186 End stage renal disease: Secondary | ICD-10-CM | POA: Diagnosis not present

## 2017-08-04 DIAGNOSIS — D509 Iron deficiency anemia, unspecified: Secondary | ICD-10-CM | POA: Diagnosis not present

## 2017-08-04 DIAGNOSIS — N2581 Secondary hyperparathyroidism of renal origin: Secondary | ICD-10-CM | POA: Diagnosis not present

## 2017-08-06 DIAGNOSIS — N2581 Secondary hyperparathyroidism of renal origin: Secondary | ICD-10-CM | POA: Diagnosis not present

## 2017-08-06 DIAGNOSIS — D509 Iron deficiency anemia, unspecified: Secondary | ICD-10-CM | POA: Diagnosis not present

## 2017-08-06 DIAGNOSIS — N186 End stage renal disease: Secondary | ICD-10-CM | POA: Diagnosis not present

## 2017-08-08 DIAGNOSIS — N2581 Secondary hyperparathyroidism of renal origin: Secondary | ICD-10-CM | POA: Diagnosis not present

## 2017-08-08 DIAGNOSIS — N186 End stage renal disease: Secondary | ICD-10-CM | POA: Diagnosis not present

## 2017-08-08 DIAGNOSIS — D509 Iron deficiency anemia, unspecified: Secondary | ICD-10-CM | POA: Diagnosis not present

## 2017-08-11 DIAGNOSIS — N186 End stage renal disease: Secondary | ICD-10-CM | POA: Diagnosis not present

## 2017-08-11 DIAGNOSIS — N2581 Secondary hyperparathyroidism of renal origin: Secondary | ICD-10-CM | POA: Diagnosis not present

## 2017-08-11 DIAGNOSIS — D509 Iron deficiency anemia, unspecified: Secondary | ICD-10-CM | POA: Diagnosis not present

## 2017-08-13 DIAGNOSIS — D509 Iron deficiency anemia, unspecified: Secondary | ICD-10-CM | POA: Diagnosis not present

## 2017-08-13 DIAGNOSIS — N186 End stage renal disease: Secondary | ICD-10-CM | POA: Diagnosis not present

## 2017-08-13 DIAGNOSIS — N2581 Secondary hyperparathyroidism of renal origin: Secondary | ICD-10-CM | POA: Diagnosis not present

## 2017-08-15 DIAGNOSIS — N2581 Secondary hyperparathyroidism of renal origin: Secondary | ICD-10-CM | POA: Diagnosis not present

## 2017-08-15 DIAGNOSIS — D509 Iron deficiency anemia, unspecified: Secondary | ICD-10-CM | POA: Diagnosis not present

## 2017-08-15 DIAGNOSIS — N186 End stage renal disease: Secondary | ICD-10-CM | POA: Diagnosis not present

## 2017-08-18 DIAGNOSIS — D509 Iron deficiency anemia, unspecified: Secondary | ICD-10-CM | POA: Diagnosis not present

## 2017-08-18 DIAGNOSIS — N186 End stage renal disease: Secondary | ICD-10-CM | POA: Diagnosis not present

## 2017-08-18 DIAGNOSIS — N2581 Secondary hyperparathyroidism of renal origin: Secondary | ICD-10-CM | POA: Diagnosis not present

## 2017-08-20 DIAGNOSIS — D509 Iron deficiency anemia, unspecified: Secondary | ICD-10-CM | POA: Diagnosis not present

## 2017-08-20 DIAGNOSIS — N2581 Secondary hyperparathyroidism of renal origin: Secondary | ICD-10-CM | POA: Diagnosis not present

## 2017-08-20 DIAGNOSIS — N186 End stage renal disease: Secondary | ICD-10-CM | POA: Diagnosis not present

## 2017-08-22 DIAGNOSIS — D509 Iron deficiency anemia, unspecified: Secondary | ICD-10-CM | POA: Diagnosis not present

## 2017-08-22 DIAGNOSIS — N2581 Secondary hyperparathyroidism of renal origin: Secondary | ICD-10-CM | POA: Diagnosis not present

## 2017-08-22 DIAGNOSIS — N186 End stage renal disease: Secondary | ICD-10-CM | POA: Diagnosis not present

## 2017-08-25 DIAGNOSIS — N186 End stage renal disease: Secondary | ICD-10-CM | POA: Diagnosis not present

## 2017-08-25 DIAGNOSIS — D509 Iron deficiency anemia, unspecified: Secondary | ICD-10-CM | POA: Diagnosis not present

## 2017-08-25 DIAGNOSIS — N2581 Secondary hyperparathyroidism of renal origin: Secondary | ICD-10-CM | POA: Diagnosis not present

## 2017-08-26 DIAGNOSIS — N186 End stage renal disease: Secondary | ICD-10-CM | POA: Diagnosis not present

## 2017-08-26 DIAGNOSIS — I129 Hypertensive chronic kidney disease with stage 1 through stage 4 chronic kidney disease, or unspecified chronic kidney disease: Secondary | ICD-10-CM | POA: Diagnosis not present

## 2017-08-26 DIAGNOSIS — Z992 Dependence on renal dialysis: Secondary | ICD-10-CM | POA: Diagnosis not present

## 2017-08-27 DIAGNOSIS — N186 End stage renal disease: Secondary | ICD-10-CM | POA: Diagnosis not present

## 2017-08-27 DIAGNOSIS — N2581 Secondary hyperparathyroidism of renal origin: Secondary | ICD-10-CM | POA: Diagnosis not present

## 2017-08-27 DIAGNOSIS — D509 Iron deficiency anemia, unspecified: Secondary | ICD-10-CM | POA: Diagnosis not present

## 2017-08-29 DIAGNOSIS — D509 Iron deficiency anemia, unspecified: Secondary | ICD-10-CM | POA: Diagnosis not present

## 2017-08-29 DIAGNOSIS — N186 End stage renal disease: Secondary | ICD-10-CM | POA: Diagnosis not present

## 2017-08-29 DIAGNOSIS — N2581 Secondary hyperparathyroidism of renal origin: Secondary | ICD-10-CM | POA: Diagnosis not present

## 2017-09-01 DIAGNOSIS — D509 Iron deficiency anemia, unspecified: Secondary | ICD-10-CM | POA: Diagnosis not present

## 2017-09-01 DIAGNOSIS — N2581 Secondary hyperparathyroidism of renal origin: Secondary | ICD-10-CM | POA: Diagnosis not present

## 2017-09-01 DIAGNOSIS — N186 End stage renal disease: Secondary | ICD-10-CM | POA: Diagnosis not present

## 2017-09-03 DIAGNOSIS — N186 End stage renal disease: Secondary | ICD-10-CM | POA: Diagnosis not present

## 2017-09-03 DIAGNOSIS — N2581 Secondary hyperparathyroidism of renal origin: Secondary | ICD-10-CM | POA: Diagnosis not present

## 2017-09-03 DIAGNOSIS — D509 Iron deficiency anemia, unspecified: Secondary | ICD-10-CM | POA: Diagnosis not present

## 2017-09-05 DIAGNOSIS — N2581 Secondary hyperparathyroidism of renal origin: Secondary | ICD-10-CM | POA: Diagnosis not present

## 2017-09-05 DIAGNOSIS — N186 End stage renal disease: Secondary | ICD-10-CM | POA: Diagnosis not present

## 2017-09-05 DIAGNOSIS — D509 Iron deficiency anemia, unspecified: Secondary | ICD-10-CM | POA: Diagnosis not present

## 2017-09-08 DIAGNOSIS — N2581 Secondary hyperparathyroidism of renal origin: Secondary | ICD-10-CM | POA: Diagnosis not present

## 2017-09-08 DIAGNOSIS — N186 End stage renal disease: Secondary | ICD-10-CM | POA: Diagnosis not present

## 2017-09-08 DIAGNOSIS — D509 Iron deficiency anemia, unspecified: Secondary | ICD-10-CM | POA: Diagnosis not present

## 2017-09-10 DIAGNOSIS — D509 Iron deficiency anemia, unspecified: Secondary | ICD-10-CM | POA: Diagnosis not present

## 2017-09-10 DIAGNOSIS — N186 End stage renal disease: Secondary | ICD-10-CM | POA: Diagnosis not present

## 2017-09-10 DIAGNOSIS — N2581 Secondary hyperparathyroidism of renal origin: Secondary | ICD-10-CM | POA: Diagnosis not present

## 2017-09-12 DIAGNOSIS — N2581 Secondary hyperparathyroidism of renal origin: Secondary | ICD-10-CM | POA: Diagnosis not present

## 2017-09-12 DIAGNOSIS — D509 Iron deficiency anemia, unspecified: Secondary | ICD-10-CM | POA: Diagnosis not present

## 2017-09-12 DIAGNOSIS — N186 End stage renal disease: Secondary | ICD-10-CM | POA: Diagnosis not present

## 2017-09-14 DIAGNOSIS — N186 End stage renal disease: Secondary | ICD-10-CM | POA: Diagnosis not present

## 2017-09-14 DIAGNOSIS — D509 Iron deficiency anemia, unspecified: Secondary | ICD-10-CM | POA: Diagnosis not present

## 2017-09-14 DIAGNOSIS — N2581 Secondary hyperparathyroidism of renal origin: Secondary | ICD-10-CM | POA: Diagnosis not present

## 2017-09-16 DIAGNOSIS — N186 End stage renal disease: Secondary | ICD-10-CM | POA: Diagnosis not present

## 2017-09-16 DIAGNOSIS — N2581 Secondary hyperparathyroidism of renal origin: Secondary | ICD-10-CM | POA: Diagnosis not present

## 2017-09-16 DIAGNOSIS — D509 Iron deficiency anemia, unspecified: Secondary | ICD-10-CM | POA: Diagnosis not present

## 2017-09-19 DIAGNOSIS — N186 End stage renal disease: Secondary | ICD-10-CM | POA: Diagnosis not present

## 2017-09-19 DIAGNOSIS — D509 Iron deficiency anemia, unspecified: Secondary | ICD-10-CM | POA: Diagnosis not present

## 2017-09-19 DIAGNOSIS — N2581 Secondary hyperparathyroidism of renal origin: Secondary | ICD-10-CM | POA: Diagnosis not present

## 2017-09-22 DIAGNOSIS — N186 End stage renal disease: Secondary | ICD-10-CM | POA: Diagnosis not present

## 2017-09-22 DIAGNOSIS — N2581 Secondary hyperparathyroidism of renal origin: Secondary | ICD-10-CM | POA: Diagnosis not present

## 2017-09-22 DIAGNOSIS — D509 Iron deficiency anemia, unspecified: Secondary | ICD-10-CM | POA: Diagnosis not present

## 2017-09-24 DIAGNOSIS — N2581 Secondary hyperparathyroidism of renal origin: Secondary | ICD-10-CM | POA: Diagnosis not present

## 2017-09-24 DIAGNOSIS — N186 End stage renal disease: Secondary | ICD-10-CM | POA: Diagnosis not present

## 2017-09-24 DIAGNOSIS — D509 Iron deficiency anemia, unspecified: Secondary | ICD-10-CM | POA: Diagnosis not present

## 2017-09-25 DIAGNOSIS — Z992 Dependence on renal dialysis: Secondary | ICD-10-CM | POA: Diagnosis not present

## 2017-09-25 DIAGNOSIS — N186 End stage renal disease: Secondary | ICD-10-CM | POA: Diagnosis not present

## 2017-09-25 DIAGNOSIS — I129 Hypertensive chronic kidney disease with stage 1 through stage 4 chronic kidney disease, or unspecified chronic kidney disease: Secondary | ICD-10-CM | POA: Diagnosis not present

## 2017-09-26 DIAGNOSIS — D631 Anemia in chronic kidney disease: Secondary | ICD-10-CM | POA: Diagnosis not present

## 2017-09-26 DIAGNOSIS — D509 Iron deficiency anemia, unspecified: Secondary | ICD-10-CM | POA: Diagnosis not present

## 2017-09-26 DIAGNOSIS — N186 End stage renal disease: Secondary | ICD-10-CM | POA: Diagnosis not present

## 2017-09-26 DIAGNOSIS — N2581 Secondary hyperparathyroidism of renal origin: Secondary | ICD-10-CM | POA: Diagnosis not present

## 2017-09-29 DIAGNOSIS — D509 Iron deficiency anemia, unspecified: Secondary | ICD-10-CM | POA: Diagnosis not present

## 2017-09-29 DIAGNOSIS — N2581 Secondary hyperparathyroidism of renal origin: Secondary | ICD-10-CM | POA: Diagnosis not present

## 2017-09-29 DIAGNOSIS — D631 Anemia in chronic kidney disease: Secondary | ICD-10-CM | POA: Diagnosis not present

## 2017-09-29 DIAGNOSIS — N186 End stage renal disease: Secondary | ICD-10-CM | POA: Diagnosis not present

## 2017-10-01 DIAGNOSIS — N2581 Secondary hyperparathyroidism of renal origin: Secondary | ICD-10-CM | POA: Diagnosis not present

## 2017-10-01 DIAGNOSIS — D509 Iron deficiency anemia, unspecified: Secondary | ICD-10-CM | POA: Diagnosis not present

## 2017-10-01 DIAGNOSIS — D631 Anemia in chronic kidney disease: Secondary | ICD-10-CM | POA: Diagnosis not present

## 2017-10-01 DIAGNOSIS — N186 End stage renal disease: Secondary | ICD-10-CM | POA: Diagnosis not present

## 2017-10-03 DIAGNOSIS — D631 Anemia in chronic kidney disease: Secondary | ICD-10-CM | POA: Diagnosis not present

## 2017-10-03 DIAGNOSIS — N186 End stage renal disease: Secondary | ICD-10-CM | POA: Diagnosis not present

## 2017-10-03 DIAGNOSIS — N2581 Secondary hyperparathyroidism of renal origin: Secondary | ICD-10-CM | POA: Diagnosis not present

## 2017-10-03 DIAGNOSIS — D509 Iron deficiency anemia, unspecified: Secondary | ICD-10-CM | POA: Diagnosis not present

## 2017-10-08 DIAGNOSIS — D509 Iron deficiency anemia, unspecified: Secondary | ICD-10-CM | POA: Diagnosis not present

## 2017-10-08 DIAGNOSIS — D631 Anemia in chronic kidney disease: Secondary | ICD-10-CM | POA: Diagnosis not present

## 2017-10-08 DIAGNOSIS — N186 End stage renal disease: Secondary | ICD-10-CM | POA: Diagnosis not present

## 2017-10-08 DIAGNOSIS — N2581 Secondary hyperparathyroidism of renal origin: Secondary | ICD-10-CM | POA: Diagnosis not present

## 2017-10-10 DIAGNOSIS — N186 End stage renal disease: Secondary | ICD-10-CM | POA: Diagnosis not present

## 2017-10-10 DIAGNOSIS — D509 Iron deficiency anemia, unspecified: Secondary | ICD-10-CM | POA: Diagnosis not present

## 2017-10-10 DIAGNOSIS — N2581 Secondary hyperparathyroidism of renal origin: Secondary | ICD-10-CM | POA: Diagnosis not present

## 2017-10-10 DIAGNOSIS — D631 Anemia in chronic kidney disease: Secondary | ICD-10-CM | POA: Diagnosis not present

## 2017-10-13 DIAGNOSIS — D509 Iron deficiency anemia, unspecified: Secondary | ICD-10-CM | POA: Diagnosis not present

## 2017-10-13 DIAGNOSIS — N2581 Secondary hyperparathyroidism of renal origin: Secondary | ICD-10-CM | POA: Diagnosis not present

## 2017-10-13 DIAGNOSIS — N186 End stage renal disease: Secondary | ICD-10-CM | POA: Diagnosis not present

## 2017-10-13 DIAGNOSIS — D631 Anemia in chronic kidney disease: Secondary | ICD-10-CM | POA: Diagnosis not present

## 2017-10-15 DIAGNOSIS — N186 End stage renal disease: Secondary | ICD-10-CM | POA: Diagnosis not present

## 2017-10-15 DIAGNOSIS — N2581 Secondary hyperparathyroidism of renal origin: Secondary | ICD-10-CM | POA: Diagnosis not present

## 2017-10-15 DIAGNOSIS — D509 Iron deficiency anemia, unspecified: Secondary | ICD-10-CM | POA: Diagnosis not present

## 2017-10-15 DIAGNOSIS — D631 Anemia in chronic kidney disease: Secondary | ICD-10-CM | POA: Diagnosis not present

## 2017-10-17 DIAGNOSIS — D509 Iron deficiency anemia, unspecified: Secondary | ICD-10-CM | POA: Diagnosis not present

## 2017-10-17 DIAGNOSIS — N2581 Secondary hyperparathyroidism of renal origin: Secondary | ICD-10-CM | POA: Diagnosis not present

## 2017-10-17 DIAGNOSIS — N186 End stage renal disease: Secondary | ICD-10-CM | POA: Diagnosis not present

## 2017-10-17 DIAGNOSIS — D631 Anemia in chronic kidney disease: Secondary | ICD-10-CM | POA: Diagnosis not present

## 2017-10-19 DIAGNOSIS — D509 Iron deficiency anemia, unspecified: Secondary | ICD-10-CM | POA: Diagnosis not present

## 2017-10-19 DIAGNOSIS — N2581 Secondary hyperparathyroidism of renal origin: Secondary | ICD-10-CM | POA: Diagnosis not present

## 2017-10-19 DIAGNOSIS — D631 Anemia in chronic kidney disease: Secondary | ICD-10-CM | POA: Diagnosis not present

## 2017-10-19 DIAGNOSIS — N186 End stage renal disease: Secondary | ICD-10-CM | POA: Diagnosis not present

## 2017-10-22 DIAGNOSIS — N186 End stage renal disease: Secondary | ICD-10-CM | POA: Diagnosis not present

## 2017-10-22 DIAGNOSIS — D631 Anemia in chronic kidney disease: Secondary | ICD-10-CM | POA: Diagnosis not present

## 2017-10-22 DIAGNOSIS — D509 Iron deficiency anemia, unspecified: Secondary | ICD-10-CM | POA: Diagnosis not present

## 2017-10-22 DIAGNOSIS — N2581 Secondary hyperparathyroidism of renal origin: Secondary | ICD-10-CM | POA: Diagnosis not present

## 2017-10-24 DIAGNOSIS — D509 Iron deficiency anemia, unspecified: Secondary | ICD-10-CM | POA: Diagnosis not present

## 2017-10-24 DIAGNOSIS — D631 Anemia in chronic kidney disease: Secondary | ICD-10-CM | POA: Diagnosis not present

## 2017-10-24 DIAGNOSIS — N186 End stage renal disease: Secondary | ICD-10-CM | POA: Diagnosis not present

## 2017-10-24 DIAGNOSIS — N2581 Secondary hyperparathyroidism of renal origin: Secondary | ICD-10-CM | POA: Diagnosis not present

## 2017-10-26 DIAGNOSIS — Z992 Dependence on renal dialysis: Secondary | ICD-10-CM | POA: Diagnosis not present

## 2017-10-26 DIAGNOSIS — I129 Hypertensive chronic kidney disease with stage 1 through stage 4 chronic kidney disease, or unspecified chronic kidney disease: Secondary | ICD-10-CM | POA: Diagnosis not present

## 2017-10-26 DIAGNOSIS — D631 Anemia in chronic kidney disease: Secondary | ICD-10-CM | POA: Diagnosis not present

## 2017-10-26 DIAGNOSIS — D509 Iron deficiency anemia, unspecified: Secondary | ICD-10-CM | POA: Diagnosis not present

## 2017-10-26 DIAGNOSIS — N186 End stage renal disease: Secondary | ICD-10-CM | POA: Diagnosis not present

## 2017-10-26 DIAGNOSIS — N2581 Secondary hyperparathyroidism of renal origin: Secondary | ICD-10-CM | POA: Diagnosis not present

## 2017-10-29 DIAGNOSIS — N186 End stage renal disease: Secondary | ICD-10-CM | POA: Diagnosis not present

## 2017-10-29 DIAGNOSIS — D631 Anemia in chronic kidney disease: Secondary | ICD-10-CM | POA: Diagnosis not present

## 2017-10-29 DIAGNOSIS — D509 Iron deficiency anemia, unspecified: Secondary | ICD-10-CM | POA: Diagnosis not present

## 2017-10-29 DIAGNOSIS — N2581 Secondary hyperparathyroidism of renal origin: Secondary | ICD-10-CM | POA: Diagnosis not present

## 2017-10-31 DIAGNOSIS — N2581 Secondary hyperparathyroidism of renal origin: Secondary | ICD-10-CM | POA: Diagnosis not present

## 2017-10-31 DIAGNOSIS — D509 Iron deficiency anemia, unspecified: Secondary | ICD-10-CM | POA: Diagnosis not present

## 2017-10-31 DIAGNOSIS — N186 End stage renal disease: Secondary | ICD-10-CM | POA: Diagnosis not present

## 2017-10-31 DIAGNOSIS — D631 Anemia in chronic kidney disease: Secondary | ICD-10-CM | POA: Diagnosis not present

## 2017-11-03 DIAGNOSIS — D631 Anemia in chronic kidney disease: Secondary | ICD-10-CM | POA: Diagnosis not present

## 2017-11-03 DIAGNOSIS — N186 End stage renal disease: Secondary | ICD-10-CM | POA: Diagnosis not present

## 2017-11-03 DIAGNOSIS — D509 Iron deficiency anemia, unspecified: Secondary | ICD-10-CM | POA: Diagnosis not present

## 2017-11-03 DIAGNOSIS — N2581 Secondary hyperparathyroidism of renal origin: Secondary | ICD-10-CM | POA: Diagnosis not present

## 2017-11-05 DIAGNOSIS — N186 End stage renal disease: Secondary | ICD-10-CM | POA: Diagnosis not present

## 2017-11-05 DIAGNOSIS — N2581 Secondary hyperparathyroidism of renal origin: Secondary | ICD-10-CM | POA: Diagnosis not present

## 2017-11-05 DIAGNOSIS — D631 Anemia in chronic kidney disease: Secondary | ICD-10-CM | POA: Diagnosis not present

## 2017-11-05 DIAGNOSIS — D509 Iron deficiency anemia, unspecified: Secondary | ICD-10-CM | POA: Diagnosis not present

## 2017-11-07 DIAGNOSIS — D509 Iron deficiency anemia, unspecified: Secondary | ICD-10-CM | POA: Diagnosis not present

## 2017-11-07 DIAGNOSIS — N2581 Secondary hyperparathyroidism of renal origin: Secondary | ICD-10-CM | POA: Diagnosis not present

## 2017-11-07 DIAGNOSIS — D631 Anemia in chronic kidney disease: Secondary | ICD-10-CM | POA: Diagnosis not present

## 2017-11-07 DIAGNOSIS — N186 End stage renal disease: Secondary | ICD-10-CM | POA: Diagnosis not present

## 2017-11-10 DIAGNOSIS — D631 Anemia in chronic kidney disease: Secondary | ICD-10-CM | POA: Diagnosis not present

## 2017-11-10 DIAGNOSIS — N186 End stage renal disease: Secondary | ICD-10-CM | POA: Diagnosis not present

## 2017-11-10 DIAGNOSIS — D509 Iron deficiency anemia, unspecified: Secondary | ICD-10-CM | POA: Diagnosis not present

## 2017-11-10 DIAGNOSIS — N2581 Secondary hyperparathyroidism of renal origin: Secondary | ICD-10-CM | POA: Diagnosis not present

## 2017-11-12 DIAGNOSIS — D631 Anemia in chronic kidney disease: Secondary | ICD-10-CM | POA: Diagnosis not present

## 2017-11-12 DIAGNOSIS — N2581 Secondary hyperparathyroidism of renal origin: Secondary | ICD-10-CM | POA: Diagnosis not present

## 2017-11-12 DIAGNOSIS — N186 End stage renal disease: Secondary | ICD-10-CM | POA: Diagnosis not present

## 2017-11-12 DIAGNOSIS — D509 Iron deficiency anemia, unspecified: Secondary | ICD-10-CM | POA: Diagnosis not present

## 2017-11-14 DIAGNOSIS — N186 End stage renal disease: Secondary | ICD-10-CM | POA: Diagnosis not present

## 2017-11-14 DIAGNOSIS — N2581 Secondary hyperparathyroidism of renal origin: Secondary | ICD-10-CM | POA: Diagnosis not present

## 2017-11-14 DIAGNOSIS — D509 Iron deficiency anemia, unspecified: Secondary | ICD-10-CM | POA: Diagnosis not present

## 2017-11-14 DIAGNOSIS — D631 Anemia in chronic kidney disease: Secondary | ICD-10-CM | POA: Diagnosis not present

## 2017-11-17 DIAGNOSIS — D509 Iron deficiency anemia, unspecified: Secondary | ICD-10-CM | POA: Diagnosis not present

## 2017-11-17 DIAGNOSIS — N186 End stage renal disease: Secondary | ICD-10-CM | POA: Diagnosis not present

## 2017-11-17 DIAGNOSIS — N2581 Secondary hyperparathyroidism of renal origin: Secondary | ICD-10-CM | POA: Diagnosis not present

## 2017-11-17 DIAGNOSIS — D631 Anemia in chronic kidney disease: Secondary | ICD-10-CM | POA: Diagnosis not present

## 2017-11-19 DIAGNOSIS — D509 Iron deficiency anemia, unspecified: Secondary | ICD-10-CM | POA: Diagnosis not present

## 2017-11-19 DIAGNOSIS — N186 End stage renal disease: Secondary | ICD-10-CM | POA: Diagnosis not present

## 2017-11-19 DIAGNOSIS — N2581 Secondary hyperparathyroidism of renal origin: Secondary | ICD-10-CM | POA: Diagnosis not present

## 2017-11-19 DIAGNOSIS — D631 Anemia in chronic kidney disease: Secondary | ICD-10-CM | POA: Diagnosis not present

## 2017-11-21 DIAGNOSIS — N186 End stage renal disease: Secondary | ICD-10-CM | POA: Diagnosis not present

## 2017-11-21 DIAGNOSIS — D631 Anemia in chronic kidney disease: Secondary | ICD-10-CM | POA: Diagnosis not present

## 2017-11-21 DIAGNOSIS — D509 Iron deficiency anemia, unspecified: Secondary | ICD-10-CM | POA: Diagnosis not present

## 2017-11-21 DIAGNOSIS — N2581 Secondary hyperparathyroidism of renal origin: Secondary | ICD-10-CM | POA: Diagnosis not present

## 2017-11-24 DIAGNOSIS — D631 Anemia in chronic kidney disease: Secondary | ICD-10-CM | POA: Diagnosis not present

## 2017-11-24 DIAGNOSIS — N186 End stage renal disease: Secondary | ICD-10-CM | POA: Diagnosis not present

## 2017-11-24 DIAGNOSIS — N2581 Secondary hyperparathyroidism of renal origin: Secondary | ICD-10-CM | POA: Diagnosis not present

## 2017-11-24 DIAGNOSIS — D509 Iron deficiency anemia, unspecified: Secondary | ICD-10-CM | POA: Diagnosis not present

## 2017-11-26 DIAGNOSIS — I129 Hypertensive chronic kidney disease with stage 1 through stage 4 chronic kidney disease, or unspecified chronic kidney disease: Secondary | ICD-10-CM | POA: Diagnosis not present

## 2017-11-26 DIAGNOSIS — D631 Anemia in chronic kidney disease: Secondary | ICD-10-CM | POA: Diagnosis not present

## 2017-11-26 DIAGNOSIS — Z992 Dependence on renal dialysis: Secondary | ICD-10-CM | POA: Diagnosis not present

## 2017-11-26 DIAGNOSIS — N2581 Secondary hyperparathyroidism of renal origin: Secondary | ICD-10-CM | POA: Diagnosis not present

## 2017-11-26 DIAGNOSIS — D509 Iron deficiency anemia, unspecified: Secondary | ICD-10-CM | POA: Diagnosis not present

## 2017-11-26 DIAGNOSIS — N186 End stage renal disease: Secondary | ICD-10-CM | POA: Diagnosis not present

## 2017-11-27 DIAGNOSIS — N186 End stage renal disease: Secondary | ICD-10-CM | POA: Diagnosis not present

## 2017-11-27 DIAGNOSIS — Z992 Dependence on renal dialysis: Secondary | ICD-10-CM | POA: Diagnosis not present

## 2017-11-27 DIAGNOSIS — I129 Hypertensive chronic kidney disease with stage 1 through stage 4 chronic kidney disease, or unspecified chronic kidney disease: Secondary | ICD-10-CM | POA: Diagnosis not present

## 2017-11-28 DIAGNOSIS — N186 End stage renal disease: Secondary | ICD-10-CM | POA: Diagnosis not present

## 2017-11-28 DIAGNOSIS — N2581 Secondary hyperparathyroidism of renal origin: Secondary | ICD-10-CM | POA: Diagnosis not present

## 2017-11-28 DIAGNOSIS — D509 Iron deficiency anemia, unspecified: Secondary | ICD-10-CM | POA: Diagnosis not present

## 2017-11-28 DIAGNOSIS — D631 Anemia in chronic kidney disease: Secondary | ICD-10-CM | POA: Diagnosis not present

## 2017-12-01 ENCOUNTER — Encounter (HOSPITAL_COMMUNITY): Payer: Self-pay

## 2017-12-01 ENCOUNTER — Emergency Department (HOSPITAL_COMMUNITY): Payer: Medicare Other

## 2017-12-01 ENCOUNTER — Other Ambulatory Visit: Payer: Self-pay

## 2017-12-01 ENCOUNTER — Inpatient Hospital Stay (HOSPITAL_COMMUNITY)
Admission: EM | Admit: 2017-12-01 | Discharge: 2017-12-04 | DRG: 291 | Disposition: A | Discharge status: Home / Self Care | Payer: Medicare Other | Attending: Oncology | Admitting: Oncology

## 2017-12-01 DIAGNOSIS — R05 Cough: Secondary | ICD-10-CM | POA: Diagnosis not present

## 2017-12-01 DIAGNOSIS — I4892 Unspecified atrial flutter: Secondary | ICD-10-CM | POA: Diagnosis not present

## 2017-12-01 DIAGNOSIS — R0602 Shortness of breath: Secondary | ICD-10-CM | POA: Diagnosis not present

## 2017-12-01 DIAGNOSIS — I4729 Other ventricular tachycardia: Secondary | ICD-10-CM

## 2017-12-01 DIAGNOSIS — I120 Hypertensive chronic kidney disease with stage 5 chronic kidney disease or end stage renal disease: Secondary | ICD-10-CM | POA: Diagnosis not present

## 2017-12-01 DIAGNOSIS — R400 Somnolence: Secondary | ICD-10-CM | POA: Diagnosis not present

## 2017-12-01 DIAGNOSIS — I161 Hypertensive emergency: Secondary | ICD-10-CM | POA: Diagnosis present

## 2017-12-01 DIAGNOSIS — N2581 Secondary hyperparathyroidism of renal origin: Secondary | ICD-10-CM | POA: Diagnosis not present

## 2017-12-01 DIAGNOSIS — R072 Precordial pain: Secondary | ICD-10-CM | POA: Diagnosis not present

## 2017-12-01 DIAGNOSIS — I160 Hypertensive urgency: Secondary | ICD-10-CM | POA: Diagnosis present

## 2017-12-01 DIAGNOSIS — I5041 Acute combined systolic (congestive) and diastolic (congestive) heart failure: Secondary | ICD-10-CM | POA: Diagnosis not present

## 2017-12-01 DIAGNOSIS — I272 Pulmonary hypertension, unspecified: Secondary | ICD-10-CM | POA: Diagnosis present

## 2017-12-01 DIAGNOSIS — I5043 Acute on chronic combined systolic (congestive) and diastolic (congestive) heart failure: Secondary | ICD-10-CM | POA: Diagnosis not present

## 2017-12-01 DIAGNOSIS — M549 Dorsalgia, unspecified: Secondary | ICD-10-CM | POA: Diagnosis not present

## 2017-12-01 DIAGNOSIS — Z9119 Patient's noncompliance with other medical treatment and regimen: Secondary | ICD-10-CM

## 2017-12-01 DIAGNOSIS — E8770 Fluid overload, unspecified: Secondary | ICD-10-CM | POA: Diagnosis not present

## 2017-12-01 DIAGNOSIS — F17290 Nicotine dependence, other tobacco product, uncomplicated: Secondary | ICD-10-CM | POA: Diagnosis present

## 2017-12-01 DIAGNOSIS — I43 Cardiomyopathy in diseases classified elsewhere: Secondary | ICD-10-CM | POA: Diagnosis not present

## 2017-12-01 DIAGNOSIS — Z992 Dependence on renal dialysis: Secondary | ICD-10-CM | POA: Diagnosis not present

## 2017-12-01 DIAGNOSIS — I132 Hypertensive heart and chronic kidney disease with heart failure and with stage 5 chronic kidney disease, or end stage renal disease: Secondary | ICD-10-CM | POA: Diagnosis not present

## 2017-12-01 DIAGNOSIS — I252 Old myocardial infarction: Secondary | ICD-10-CM | POA: Diagnosis not present

## 2017-12-01 DIAGNOSIS — J441 Chronic obstructive pulmonary disease with (acute) exacerbation: Secondary | ICD-10-CM | POA: Diagnosis present

## 2017-12-01 DIAGNOSIS — I4891 Unspecified atrial fibrillation: Secondary | ICD-10-CM | POA: Diagnosis not present

## 2017-12-01 DIAGNOSIS — I472 Ventricular tachycardia: Secondary | ICD-10-CM | POA: Diagnosis present

## 2017-12-01 DIAGNOSIS — Z79899 Other long term (current) drug therapy: Secondary | ICD-10-CM

## 2017-12-01 DIAGNOSIS — D631 Anemia in chronic kidney disease: Secondary | ICD-10-CM | POA: Diagnosis present

## 2017-12-01 DIAGNOSIS — I340 Nonrheumatic mitral (valve) insufficiency: Secondary | ICD-10-CM | POA: Diagnosis not present

## 2017-12-01 DIAGNOSIS — F1010 Alcohol abuse, uncomplicated: Secondary | ICD-10-CM | POA: Diagnosis present

## 2017-12-01 DIAGNOSIS — I5033 Acute on chronic diastolic (congestive) heart failure: Secondary | ICD-10-CM

## 2017-12-01 DIAGNOSIS — E8889 Other specified metabolic disorders: Secondary | ICD-10-CM | POA: Diagnosis present

## 2017-12-01 DIAGNOSIS — N186 End stage renal disease: Secondary | ICD-10-CM | POA: Diagnosis not present

## 2017-12-01 DIAGNOSIS — I639 Cerebral infarction, unspecified: Secondary | ICD-10-CM | POA: Diagnosis not present

## 2017-12-01 DIAGNOSIS — R069 Unspecified abnormalities of breathing: Secondary | ICD-10-CM | POA: Diagnosis not present

## 2017-12-01 LAB — BASIC METABOLIC PANEL
Anion gap: 18 — ABNORMAL HIGH (ref 5–15)
BUN: 51 mg/dL — AB (ref 6–20)
CALCIUM: 9.1 mg/dL (ref 8.9–10.3)
CO2: 18 mmol/L — AB (ref 22–32)
CREATININE: 13.35 mg/dL — AB (ref 0.61–1.24)
Chloride: 106 mmol/L (ref 101–111)
GFR calc non Af Amer: 3 mL/min — ABNORMAL LOW (ref >60)
GFR, EST AFRICAN AMERICAN: 4 mL/min — AB (ref >60)
Glucose, Bld: 96 mg/dL (ref 65–99)
Potassium: 4.1 mmol/L (ref 3.5–5.1)
Sodium: 142 mmol/L (ref 135–145)

## 2017-12-01 LAB — I-STAT VENOUS BLOOD GAS, ED
ACID-BASE DEFICIT: 5 mmol/L — AB (ref 0.0–2.0)
Bicarbonate: 19.5 mmol/L — ABNORMAL LOW (ref 20.0–28.0)
O2 SAT: 74 %
TCO2: 21 mmol/L — ABNORMAL LOW (ref 22–32)
pCO2, Ven: 34.8 mmHg — ABNORMAL LOW (ref 44.0–60.0)
pH, Ven: 7.356 (ref 7.250–7.430)
pO2, Ven: 41 mmHg (ref 32.0–45.0)

## 2017-12-01 LAB — CBC
HCT: 32.2 % — ABNORMAL LOW (ref 39.0–52.0)
Hemoglobin: 10.8 g/dL — ABNORMAL LOW (ref 13.0–17.0)
MCH: 33.4 pg (ref 26.0–34.0)
MCHC: 33.5 g/dL (ref 30.0–36.0)
MCV: 99.7 fL (ref 78.0–100.0)
PLATELETS: 192 10*3/uL (ref 150–400)
RBC: 3.23 MIL/uL — AB (ref 4.22–5.81)
RDW: 15.6 % — AB (ref 11.5–15.5)
WBC: 6.1 10*3/uL (ref 4.0–10.5)

## 2017-12-01 LAB — TROPONIN I
Troponin I: 0.07 ng/mL (ref <0.03)
Troponin I: 0.06 ng/mL (ref <0.03)

## 2017-12-01 LAB — I-STAT TROPONIN, ED: TROPONIN I, POC: 0.08 ng/mL (ref 0.00–0.08)

## 2017-12-01 LAB — CBG MONITORING, ED: Glucose-Capillary: 115 mg/dL — ABNORMAL HIGH (ref 65–99)

## 2017-12-01 LAB — INFLUENZA PANEL BY PCR (TYPE A & B)
Influenza A By PCR: NEGATIVE
Influenza B By PCR: NEGATIVE

## 2017-12-01 LAB — ETHANOL: Alcohol, Ethyl (B): <10 mg/dL (ref <10)

## 2017-12-01 LAB — BRAIN NATRIURETIC PEPTIDE: B NATRIURETIC PEPTIDE 5: 1765.6 pg/mL — AB (ref 0.0–100.0)

## 2017-12-01 MED ORDER — NITROGLYCERIN 0.4 MG SL SUBL: 0.4000 mg | SUBLINGUAL_TABLET | SUBLINGUAL | Status: DC | PRN

## 2017-12-01 MED ORDER — THIAMINE HCL 100 MG/ML IJ SOLN
100.0000 mg | Freq: Once | INTRAMUSCULAR | Status: AC
  Administered 2017-12-01: 100 mg via INTRAVENOUS
  Filled 2017-12-01: qty 2

## 2017-12-01 MED ORDER — RENA-VITE PO TABS
1.0000 | ORAL_TABLET | Freq: Every day | ORAL | Status: DC
  Administered 2017-12-01 – 2017-12-03 (×3): 1 via ORAL
  Filled 2017-12-01 (×3): qty 1

## 2017-12-01 MED ORDER — ACETAMINOPHEN 325 MG PO TABS
650.0000 mg | ORAL_TABLET | Freq: Four times a day (QID) | ORAL | Status: DC | PRN
  Administered 2017-12-04 (×2): 650 mg via ORAL
  Filled 2017-12-01 (×2): qty 2

## 2017-12-01 MED ORDER — DIPHENHYDRAMINE-APAP (SLEEP) 25-500 MG PO TABS: 1.0000 | ORAL_TABLET | Freq: Every evening | ORAL | Status: DC | PRN

## 2017-12-01 MED ORDER — LABETALOL HCL 5 MG/ML IV SOLN
10.0000 mg | INTRAVENOUS | Status: DC | PRN
Start: 2017-12-01 — End: 2017-12-04
  Administered 2017-12-01 – 2017-12-03 (×3): 10 mg via INTRAVENOUS
  Filled 2017-12-01 (×3): qty 4

## 2017-12-01 MED ORDER — HEPARIN SODIUM (PORCINE) 5000 UNIT/ML IJ SOLN
5000.0000 [IU] | Freq: Three times a day (TID) | INTRAMUSCULAR | Status: DC
  Administered 2017-12-01 – 2017-12-04 (×8): 5000 [IU] via SUBCUTANEOUS
  Filled 2017-12-01 (×7): qty 1

## 2017-12-01 MED ORDER — AMLODIPINE BESYLATE 10 MG PO TABS
10.0000 mg | ORAL_TABLET | Freq: Every day | ORAL | Status: DC
  Administered 2017-12-01 – 2017-12-03 (×3): 10 mg via ORAL
  Filled 2017-12-01 (×3): qty 1

## 2017-12-01 MED ORDER — HYDRALAZINE HCL 20 MG/ML IJ SOLN
10.0000 mg | Freq: Once | INTRAMUSCULAR | Status: AC
  Administered 2017-12-01: 10 mg via INTRAVENOUS
  Filled 2017-12-01: qty 1

## 2017-12-01 MED ORDER — ALBUTEROL SULFATE (2.5 MG/3ML) 0.083% IN NEBU
5.0000 mg | INHALATION_SOLUTION | Freq: Once | RESPIRATORY_TRACT | Status: DC
  Filled 2017-12-01: qty 6

## 2017-12-01 MED ORDER — IPRATROPIUM-ALBUTEROL 0.5-2.5 (3) MG/3ML IN SOLN
3.0000 mL | Freq: Once | RESPIRATORY_TRACT | Status: AC
  Administered 2017-12-01: 3 mL via RESPIRATORY_TRACT
  Filled 2017-12-01: qty 3

## 2017-12-01 MED ORDER — CALCIUM ACETATE (PHOS BINDER) 667 MG PO CAPS
1334.0000 mg | ORAL_CAPSULE | Freq: Three times a day (TID) | ORAL | Status: DC
  Administered 2017-12-02 – 2017-12-04 (×8): 1334 mg via ORAL
  Filled 2017-12-01 (×8): qty 2

## 2017-12-01 MED ORDER — METHYLPREDNISOLONE SODIUM SUCC 125 MG IJ SOLR
125.0000 mg | Freq: Once | INTRAMUSCULAR | Status: AC
  Administered 2017-12-01: 125 mg via INTRAVENOUS
  Filled 2017-12-01: qty 2

## 2017-12-01 MED ORDER — CINACALCET HCL 30 MG PO TABS
90.0000 mg | ORAL_TABLET | ORAL | Status: DC
  Administered 2017-12-03: 90 mg via ORAL
  Filled 2017-12-01 (×2): qty 3

## 2017-12-01 NOTE — ED Notes (Signed)
Patient denies pain and is resting comfortably.  

## 2017-12-01 NOTE — ED Notes (Signed)
Pt is now more alert. States he is just tired

## 2017-12-01 NOTE — ED Notes (Signed)
Pt will be dialysed after 1800

## 2017-12-01 NOTE — ED Notes (Signed)
MD at bedside. 

## 2017-12-01 NOTE — ED Notes (Addendum)
Critical Lab Value: read back and physician alerted    Troponin 0.07

## 2017-12-01 NOTE — ED Notes (Signed)
Pt to CT

## 2017-12-01 NOTE — Consult Note (Signed)
[facility] Renal Consultation Note    Indication for Consultation:  Management of ESRD/hemodialysis; anemia, hypertension/volume and secondary hyperparathyroidism  HPI: Kenneth [name] is a 63 y.o. male with ESRD on HD ([place]), HTN, NICM, EF 55-60 (Echo 12/17)%, hx EtOH abuse.   He is admitted with HTN urgency. He presented to ED this am with SOB and chest pain. EMS treated with albuterol with some improvement in breathing. Hypertensive and tachycardic in ED. Head CT neg. EKG with no acute changes. CXR with mild pulm edema.  Troponins WNL. BNP elevated.   Seen in ED, drowsy, snoring and falling asleep during questioning. O2 sats 98-100% on RA. Breathing has improved since arrival. Says he hasn't got a good night's rest in 2 weeks d/t restlessness. No known history of OSA. Smokes cigars. Denies HA, CP, orthopnea, PND, N,V, abdominal complaints.   Last HD was Saturday 2/2. He has been compliant with HD and receiving full treatments. He left last treatment with post HD weight 87.3kg with EDW of 87kg.    Past Medical History:  Diagnosis Date  . Cardiomyopathy secondary    likely related to HTN heart disease; possibly ETOH related as well  . Chronic combined systolic and diastolic heart failure (HCC)    Echocardiogram 09/22/11: Moderate LVH, EF 73-41%, grade 3 diastolic dysfunction, mild MR, moderate to severe LAE, mild RVE, mild to moderate TR, small to moderate pericardial effusion  . CKD (chronic kidney disease) stage 4, GFR 15-29 ml/min (HCC)    renal failure; due to hypertensive nephrosclerosis  . History of alcohol abuse   . Hypertension    Past Surgical History:  Procedure Laterality Date  . AV FISTULA PLACEMENT Left 05/14/2016   Procedure: LEFT ARM BASILIC VEIN TRANSPOSITION;  Surgeon: Rosetta Posner, MD;  Location: [place];  Service: Vascular;  Laterality: Left;  . PERIPHERAL VASCULAR CATHETERIZATION N/A 05/13/2016   Procedure: Dialysis/Perma Catheter  Insertion;  Surgeon: Serafina Mitchell, MD;  Location: [place] [facility];  Service: Cardiovascular;  Laterality: N/A;   Family History  Problem Relation Age of Onset  . Emphysema Mother   . Cirrhosis Father    Social History:  reports that he quit smoking about a year ago. His smoking use included cigars. He has quit using smokeless tobacco. He reports that he does not drink alcohol or use drugs. No Known Allergies Prior to Admission medications   Medication Sig Start Date End Date Taking? Authorizing Provider  acetaminophen (TYLENOL) 325 MG tablet Take 2 tablets (650 mg total) by mouth every 6 (six) hours as needed for mild pain (or Fever >/= 101). 10/11/16  Yes Domenic Polite, MD  amLODipine (NORVASC) 10 MG tablet Take 10 mg by mouth at bedtime.   Yes [provider]  calcium acetate (PHOSLO) 667 MG capsule Take 1,334-2,001 mg by mouth See admin instructions. 2,001 mg three times a day with meals and 1,334 mg two times a day with snacks   Yes [provider]  diphenhydramine-acetaminophen (TYLENOL PM) 25-500 MG TABS tablet Take 1 tablet by mouth at bedtime as needed (for sleep).   Yes [provider]   Current Facility-Administered Medications  Medication Dose Route Frequency Provider Last Rate Last Dose  . acetaminophen (TYLENOL) tablet 650 mg  650 mg Oral Q6H PRN Collier Salina, MD      . amLODipine (NORVASC) tablet 10 mg  10 mg Oral QHS Rice, Resa Miner, MD      . heparin injection 5,000 Units  5,000 Units Subcutaneous Q8H Rice, Resa Miner, MD      . labetalol (NORMODYNE,TRANDATE) injection 10 mg  10 mg Intravenous Q2H PRN Collier Salina, MD   10 mg at 12/01/17 1247  . nitroGLYCERIN (NITROSTAT) SL tablet 0.4 mg  0.4 mg Sublingual Q5 min PRN Collier Salina, MD       Current Outpatient Medications  Medication Sig Dispense Refill  . acetaminophen (TYLENOL) 325 MG tablet Take 2 tablets (650 mg total) by mouth every 6 (six) hours as needed  for mild pain (or Fever >/= 101).    Marland Kitchen amLODipine (NORVASC) 10 MG tablet Take 10 mg by mouth at bedtime.    . calcium acetate (PHOSLO) 667 MG capsule Take 1,334-2,001 mg by mouth See admin instructions. 2,001 mg three times a day with meals and 1,334 mg two times a day with snacks    . diphenhydramine-acetaminophen (TYLENOL PM) 25-500 MG TABS tablet Take 1 tablet by mouth at bedtime as needed (for sleep).      ROS: As per HPI otherwise negative.  Physical Exam: Vitals:   12/01/17 1134 12/01/17 1145 12/01/17 1245 12/01/17 1252  BP:  (!) 218/85 (!) 184/133 (!) 172/84  Pulse:  (!) 107 (!) 112 100  Resp:  15 (!) 21   Temp:      TempSrc:      SpO2: 99% 98% 99% 100%  Weight:      Height:         General: WDWN NAD Head: NCAT sclera not icteric MMM Neck: Supple. No JVD No masses Lungs: Normal respiratory effort. Wheezes R side  Heart: Tachcardic SEM  Abdomen: soft NT + BS Lower extremities:without edema or ischemic changes, no open wounds  Neuro: A & O  X 3. Moves all extremities spontaneously. Psych:  Responds to questions appropriately with a normal affect. Dialysis Access: LUE AVF +bruit/thrill   Labs: Basic Metabolic Panel: Recent Labs  Lab 12/01/17 0840  NA 142  K 4.1  CL 106  CO2 18*  GLUCOSE 96  BUN 51*  CREATININE 13.35*  CALCIUM 9.1   Liver Function Tests: No results for input(s): AST, ALT, ALKPHOS, BILITOT, PROT, ALBUMIN in the last 168 hours. No results for input(s): LIPASE, AMYLASE in the last 168 hours. No results for input(s): AMMONIA in the last 168 hours. CBC: Recent Labs  Lab 12/01/17 0840  WBC 6.1  HGB 10.8*  HCT 32.2*  MCV 99.7  PLT 192   Cardiac Enzymes: No results for input(s): CKTOTAL, CKMB, CKMBINDEX, TROPONINI in the last 168 hours. CBG: Recent Labs  Lab 12/01/17 1103  GLUCAP 115*   Iron Studies: No results for input(s): IRON, TIBC, TRANSFERRIN, FERRITIN in the last 72 hours. Studies/Results: Dg Chest 2 View  Result Date:  12/01/2017 CLINICAL DATA:  Shortness of breath and cough EXAM: CHEST  2 VIEW COMPARISON:  July 28, 2017 FINDINGS: There is interstitial pulmonary edema, mainly in the lower lobe regions. There is no airspace consolidation. There is a minimal left pleural effusion. There is cardiomegaly. There is borderline pulmonary venous hypertension. No adenopathy. There is aortic atherosclerosis. There is degenerative change in each shoulder. IMPRESSION: Cardiomegaly with borderline pulmonary vascular congestion. Mild interstitial edema. No consolidation. Aortic atherosclerosis noted. Aortic Atherosclerosis (ICD10-I70.0). Electronically Signed   By: Lowella Grip III M.D.   On: 12/01/2017 09:05   Ct Head Wo Contrast  Result Date: 12/01/2017 CLINICAL DATA:  Wheezing, chest pain EXAM: CT HEAD WITHOUT CONTRAST TECHNIQUE: Contiguous axial images were obtained from  the base of the skull through the vertex without intravenous contrast. COMPARISON:  10/07/2016 FINDINGS: Brain: Old lacunar infarcts in the basal ganglia. There is atrophy and chronic small vessel disease changes. No acute intracranial abnormality. Specifically, no hemorrhage, hydrocephalus, mass lesion, acute infarction, or significant intracranial injury. Vascular: No hyperdense vessel or unexpected calcification. Skull: No acute calvarial abnormality. Sinuses/Orbits: No acute finding Other: None IMPRESSION: No acute intracranial abnormality. Atrophy, chronic microvascular disease. Old bilateral basal ganglia lacunar infarcts. Electronically Signed   By: Rolm Baptise M.D.   On: 12/01/2017 10:53    Dialysis Orders:  [facility] TTS 4.25h 180F BFR 400/800 EDW 87kg 2K/2Ca L AVF Hep 2400 Venofer 50mg  IV q week Calcitriol 2.21mcg PO TIW Sensipar 90mg  PO TIW   Assessment/Plan: 1. HTN urgency - HD today for volume removal. Resume home meds (only Norvasc on med list). IV meds per primary 2. Dyspnea/volume - improved with albuterol. Volume likely also contributing.  Mild pulm edema on CXR. Max UF with HD today  3. ESRD -  TTS. Continue on schedule  4.  Anemia  - Hgb 10.8 No ESA needs. Follow trend  5.  Metabolic bone disease -  Ca ok. Continue VDRA/Sensipar/Ca acetate binder  6.  Nutrition - Renal diet with fluid restrictions/vitamins 7. CM/CHF   Lynnda Child PA-C [facility] Pager [phone] 12/01/2017, 1:07 PM   Pt seen, examined and agree w A/P as above.  Kelly Splinter MD [employer] pager [phone]   12/01/2017, 3:48 PM

## 2017-12-01 NOTE — ED Provider Notes (Signed)
Emergency Department Provider Note   I have reviewed the triage vital signs and the nursing notes.   HISTORY  Chief Complaint Shortness of Breath   HPI Kenneth [name] is a 62 y.o. male with past medical history of hypertension cardiomyopathy, ESRD on TRS HD, HTN, and EtOH abuse presents to the emergency department by EMS with wheezing, shortness of breath, and chest pain.  He reports productive cough with clear sputum over the past several days.  He continues to smoke cigarettes.  His last dialysis session was Saturday.  He denies any fever or shaking chills.  No abdominal discomfort.  Denies any chest pain currently but states he had some earlier today which was concerning to him.  No similar symptoms in the past. No modifying factors. States that he continues to drink EtOH and has not been cutting back.   Past Medical History:  Diagnosis Date  . Cardiomyopathy secondary    likely related to HTN heart disease; possibly ETOH related as well  . Chronic combined systolic and diastolic heart failure (HCC)    Echocardiogram 09/22/11: Moderate LVH, EF 54-65%, grade 3 diastolic dysfunction, mild MR, moderate to severe LAE, mild RVE, mild to moderate TR, small to moderate pericardial effusion  . CKD (chronic kidney disease) stage 4, GFR 15-29 ml/min (HCC)    renal failure; due to hypertensive nephrosclerosis  . History of alcohol abuse   . Hypertension     Patient Active Problem List   Diagnosis Date Noted  . Hypothermia 10/08/2016  . ESRD on hemodialysis ([place]) 10/08/2016  . Fall 10/08/2016  . Syncope 10/07/2016  . Near syncope 07/07/2016  . Cardiomyopathy ([place]) 07/07/2016  . NSVT (nonsustained ventricular tachycardia) ([place]) 07/06/2016  . Hypoglycemia 07/06/2016  . Alcohol abuse 05/13/2016  . ESRD (end stage renal disease) ([place]) 05/12/2016  . Hypertensive urgency 12/08/2013  . Cardiomyopathy secondary 10/09/2011  . Essential hypertension 10/09/2011  . Alcohol withdrawal delirium  ([place]) 09/25/2011  . Systolic and diastolic CHF, chronic ([place]) 09/24/2011    Past Surgical History:  Procedure Laterality Date  . AV FISTULA PLACEMENT Left 05/14/2016   Procedure: LEFT ARM BASILIC VEIN TRANSPOSITION;  Surgeon: Rosetta Posner, MD;  Location: [place];  Service: Vascular;  Laterality: Left;  . PERIPHERAL VASCULAR CATHETERIZATION N/A 05/13/2016   Procedure: Dialysis/Perma Catheter Insertion;  Surgeon: Serafina Mitchell, MD;  Location: [place] [facility];  Service: Cardiovascular;  Laterality: N/A;    Current Outpatient Rx  . Order #: [id number] Class: OTC  . Order #: [id number] Class: Historical Med  . Order #: [id number] Class: Historical Med  . Order #: [id number] Class: Historical Med    Allergies Patient has no known allergies.  Family History  Problem Relation Age of Onset  . Emphysema Mother   . Cirrhosis Father     Social History Social History   Tobacco Use  . Smoking status: Former Smoker    Types: Cigars    Last attempt to quit: 11/27/2016    Years since quitting: 1.0  . Smokeless tobacco: Former Network engineer Use Topics  . Alcohol use: No    Frequency: Never    Comment: occ  . Drug use: No    Review of Systems  Constitutional: No fever/chills. Positive fatigue.  Eyes: No visual changes. ENT: No sore throat. Cardiovascular: Positive chest pain. Respiratory: Positive shortness of breath and cough.  Gastrointestinal: No abdominal pain.  No nausea, no vomiting.  No diarrhea.  No constipation. Genitourinary: Negative for dysuria. Musculoskeletal: Negative for back pain.  Skin: Negative for rash. Neurological: Negative for headaches, focal weakness or numbness.  10-point ROS otherwise negative.  ____________________________________________   PHYSICAL EXAM:  VITAL SIGNS: ED Triage Vitals  Enc Vitals Group     BP 12/01/17 0830 (!) 164/101     Pulse Rate 12/01/17 0830 (!) 102     Resp 12/01/17 0830 (!) 25     Temp 12/01/17 0835 98.3 F (36.8 C)      Temp Source 12/01/17 0835 Oral     SpO2 12/01/17 0830 99 %     Weight 12/01/17 0829 210 lb (95.3 kg)     Height 12/01/17 0829 5\' 10"  (1.778 m)     Pain Score 12/01/17 0830 2    Constitutional: Somnolent but responds to voice and provides history.  Eyes: Conjunctivae are normal. PERRL. Head: Atraumatic. Nose: No congestion/rhinnorhea. Mouth/Throat: Mucous membranes are moist.  Oropharynx non-erythematous. Neck: No stridor.  Cardiovascular: Tachycardia. Good peripheral circulation. Grossly normal heart sounds.   Respiratory: Increased respiratory effort.  No retractions. Lungs with end-expiratory wheezing throughout.  Gastrointestinal: Soft and nontender. No distention.  Musculoskeletal: No lower extremity tenderness nor edema. No gross deformities of extremities. Neurologic:  Normal speech and language. No gross focal neurologic deficits are appreciated.  Skin:  Skin is warm, dry and intact. No rash noted.   ____________________________________________   LABS (all labs ordered are listed, but only abnormal results are displayed)  Labs Reviewed  BASIC METABOLIC PANEL - Abnormal; Notable for the following components:      Result Value   CO2 18 (*)    BUN 51 (*)    Creatinine, Ser 13.35 (*)    GFR calc non Af Amer 3 (*)    GFR calc Af Amer 4 (*)    Anion gap 18 (*)    All other components within normal limits  CBC - Abnormal; Notable for the following components:   RBC 3.23 (*)    Hemoglobin 10.8 (*)    HCT 32.2 (*)    RDW 15.6 (*)    All other components within normal limits  BRAIN NATRIURETIC PEPTIDE - Abnormal; Notable for the following components:   B Natriuretic Peptide 1,765.6 (*)    All other components within normal limits  I-STAT VENOUS BLOOD GAS, ED - Abnormal; Notable for the following components:   pCO2, Ven 34.8 (*)    Bicarbonate 19.5 (*)    TCO2 21 (*)    Acid-base deficit 5.0 (*)    All other components within normal limits  CBG MONITORING, ED -  Abnormal; Notable for the following components:   Glucose-Capillary 115 (*)    All other components within normal limits  INFLUENZA PANEL BY PCR (TYPE A & B)  ETHANOL  HIV ANTIBODY (ROUTINE TESTING)  TROPONIN I  TROPONIN I  I-STAT TROPONIN, ED   ____________________________________________  EKG   EKG Interpretation  Date/Time:  Tuesday December 01 2017 08:30:45 EST Ventricular Rate:  99 PR Interval:    QRS Duration: 91 QT Interval:  362 QTC Calculation: 465 R Axis:   8 Text Interpretation:  Sinus tachycardia Ventricular premature complex Consider left ventricular hypertrophy Anterior Q waves, possibly due to LVH No STEMI. Similar to prior.  Confirmed by Nanda Quinton [phone]) on 12/01/2017 11:09:39 AM       ____________________________________________  RADIOLOGY  Dg Chest 2 View  Result Date: 12/01/2017 CLINICAL DATA:  Shortness of breath and cough EXAM: CHEST  2 VIEW COMPARISON:  July 28, 2017 FINDINGS: There is interstitial pulmonary  edema, mainly in the lower lobe regions. There is no airspace consolidation. There is a minimal left pleural effusion. There is cardiomegaly. There is borderline pulmonary venous hypertension. No adenopathy. There is aortic atherosclerosis. There is degenerative change in each shoulder. IMPRESSION: Cardiomegaly with borderline pulmonary vascular congestion. Mild interstitial edema. No consolidation. Aortic atherosclerosis noted. Aortic Atherosclerosis (ICD10-I70.0). Electronically Signed   By: Lowella Grip III M.D.   On: 12/01/2017 09:05   Ct Head Wo Contrast  Result Date: 12/01/2017 CLINICAL DATA:  Wheezing, chest pain EXAM: CT HEAD WITHOUT CONTRAST TECHNIQUE: Contiguous axial images were obtained from the base of the skull through the vertex without intravenous contrast. COMPARISON:  10/07/2016 FINDINGS: Brain: Old lacunar infarcts in the basal ganglia. There is atrophy and chronic small vessel disease changes. No acute intracranial  abnormality. Specifically, no hemorrhage, hydrocephalus, mass lesion, acute infarction, or significant intracranial injury. Vascular: No hyperdense vessel or unexpected calcification. Skull: No acute calvarial abnormality. Sinuses/Orbits: No acute finding Other: None IMPRESSION: No acute intracranial abnormality. Atrophy, chronic microvascular disease. Old bilateral basal ganglia lacunar infarcts. Electronically Signed   By: Rolm Baptise M.D.   On: 12/01/2017 10:53    ____________________________________________   PROCEDURES  Procedure(s) performed:   Procedures  None ____________________________________________   INITIAL IMPRESSION / ASSESSMENT AND PLAN / ED COURSE  Pertinent labs & imaging results that were available during my care of the patient were reviewed by me and considered in my medical decision making (see chart for details).  Patient presents to the emergency department with shortness of breath and chest pain (resolved).  Patient received albuterol in route with EMS.  He has wheezing on exam.  Suspect COPD with low suspicion for ACS or PE.  No infection symptoms.  Patient is tachycardic and very hypertensive.  I note that the patient is very somnolent but is able to provide a full history.  He has no focal neurological deficits on exam.  Despite his elevated blood pressures I have no other evidence of hypertension emergency.  I ordered a CT scan which was read as normal with no bleeding.  Chest x-ray shows no pneumonia.  EKG is similar to prior. Plan to send flu and reassess after additional nebs.   11:27 AM The patient's labs show an elevated BUN.  No significant CO2 elevation on VBG to explain the patient's somnolence. I spoke with Nephrology who will arrange for hemodialysis today. Plan to discuss with the unassigned med team a plan for admission, additional nebs, HD, and reassess.   Discussed patient's case with Medicine to request admission. Patient and family (if present)  updated with plan. Care transferred to Medicine service.  I reviewed all nursing notes, vitals, pertinent old records, EKGs, labs, imaging (as available).  ____________________________________________  FINAL CLINICAL IMPRESSION(S) / ED DIAGNOSES  Final diagnoses:  Shortness of breath  Precordial chest pain     MEDICATIONS GIVEN DURING THIS VISIT:  Medications  amLODipine (NORVASC) tablet 10 mg (not administered)  acetaminophen (TYLENOL) tablet 650 mg (not administered)  heparin injection 5,000 Units (not administered)  labetalol (NORMODYNE,TRANDATE) injection 10 mg (10 mg Intravenous Given 12/01/17 1247)  nitroGLYCERIN (NITROSTAT) SL tablet 0.4 mg (not administered)  cinacalcet (SENSIPAR) tablet 90 mg (not administered)  multivitamin (RENA-VIT) tablet 1 tablet (not administered)  calcium acetate (PHOSLO) capsule 1,334 mg (not administered)  ipratropium-albuterol (DUONEB) 0.5-2.5 (3) MG/3ML nebulizer solution 3 mL (3 mLs Nebulization Given 12/01/17 0944)  hydrALAZINE (APRESOLINE) injection 10 mg (10 mg Intravenous Given 12/01/17 1056)  thiamine (B-1)  injection 100 mg (100 mg Intravenous Given 12/01/17 1055)  methylPREDNISolone sodium succinate (SOLU-MEDROL) 125 mg/2 mL injection 125 mg (125 mg Intravenous Given 12/01/17 1246)    Note:  This document was prepared using Dragon voice recognition software and may include unintentional dictation errors.  Nanda Quinton, MD Emergency Medicine    Long, [facility], MD 12/01/17 [phone]

## 2017-12-01 NOTE — ED Notes (Signed)
Patient denies pain and is resting comfortably. Will wake to soft voice. Follows commands but right back to sleep

## 2017-12-01 NOTE — H&P (Signed)
Date: 12/01/2017               Patient Name:  Kenneth [name] MRN: [id number]  DOB: [birth year] Age / Sex: 62 y.o., male   PCP: Patient, No Pcp Per         Medical Service: Internal Medicine Teaching Service         Attending Physician: Dr. Beryle Beams, Alyson Locket, MD    First Contact: Dr. Johny Chess Pager: [phone]  Second Contact: Dr. Philipp Ovens Pager: [phone]       After Hours (After 5p/  First Contact Pager: [phone]  weekends / holidays): Second Contact Pager: [phone]   Chief Complaint: Shortness of breath  History of Present Illness: [name] is a 62 year old man with a history of HTN, ESRD on HD TTS, HFpEF who presented to the ED today for shortness of breath. He is not exactly certain when symptoms started but it has been ongoing a few days. He tolerated dialysis on Saturday uneventfully. He reports a cough with some production of clear sputum and then he developed chest pain while coughing yesterday. Is chest pain did improve afterwards but he was unable to sleep last night from discomfort and anxiety. He denies any major change in his diet, fluid intake, or medications. There was some inconsistent reporting with confirming smoking to providers in the ED but denies this to me. After arrival in the ED he received high dose steroids and breathing treatment without great improvement in symptoms. CXR showed vascular congestion and he was notably hypertensive up to 210s SBP. He also appeared to be extremely drowsy with falling asleep when not being directly questioned or manipulated. CT head was obtained due to persistent difficulty arousing him but showed no bleed or acute process. Due to continued dyspnea and uncontrolled blood pressure as well as somnolence he is being admitted for further treatment.  Meds:  Current Meds  Medication Sig  . acetaminophen (TYLENOL) 325 MG tablet Take 2 tablets (650 mg total) by mouth every 6 (six) hours as needed for mild pain (or Fever >/= 101).  Marland Kitchen amLODipine  (NORVASC) 10 MG tablet Take 10 mg by mouth at bedtime.  . calcium acetate (PHOSLO) 667 MG capsule Take 1,334-2,001 mg by mouth See admin instructions. 2,001 mg three times a day with meals and 1,334 mg two times a day with snacks  . diphenhydramine-acetaminophen (TYLENOL PM) 25-500 MG TABS tablet Take 1 tablet by mouth at bedtime as needed (for sleep).     Allergies: Allergies as of 12/01/2017  . (No Known Allergies)   Past Medical History:  Diagnosis Date  . Cardiomyopathy secondary    likely related to HTN heart disease; possibly ETOH related as well  . Chronic combined systolic and diastolic heart failure (HCC)    Echocardiogram 09/22/11: Moderate LVH, EF 94-17%, grade 3 diastolic dysfunction, mild MR, moderate to severe LAE, mild RVE, mild to moderate TR, small to moderate pericardial effusion  . CKD (chronic kidney disease) stage 4, GFR 15-29 ml/min (HCC)    renal failure; due to hypertensive nephrosclerosis  . History of alcohol abuse   . Hypertension     Family History:  Family History  Problem Relation Age of Onset  . Emphysema Mother   . Cirrhosis Father     Social History:  Social History   Socioeconomic History  . Marital status: Married    Spouse name: None  . Number of children: None  . Years of education: None  .  Highest education level: None  Social Needs  . Financial resource strain: None  . Food insecurity - worry: None  . Food insecurity - inability: None  . Transportation needs - medical: None  . Transportation needs - non-medical: None  Occupational History  . None  Tobacco Use  . Smoking status: Former Smoker    Types: Cigars    Last attempt to quit: 11/27/2016    Years since quitting: 1.0  . Smokeless tobacco: Former Network engineer and Sexual Activity  . Alcohol use: No    Frequency: Never    Comment: occ  . Drug use: No  . Sexual activity: None  Other Topics Concern  . None  Social History Narrative  . None    Review of  Systems: Review of Systems  Constitutional: Negative for fever.  HENT: Positive for congestion.   Eyes: Negative for blurred vision.  Respiratory: Positive for cough, sputum production, shortness of breath and wheezing.   Cardiovascular: Positive for chest pain and palpitations. Negative for leg swelling.  Gastrointestinal: Negative for abdominal pain.  Musculoskeletal: Negative for falls.  Skin: Negative for rash.  Neurological: Negative for headaches.  Endo/Heme/Allergies: Negative for polydipsia.  Psychiatric/Behavioral: The patient is nervous/anxious and has insomnia.     Physical Exam: Blood pressure (!) 172/84, pulse 100, temperature 98.3 F (36.8 C), temperature source Oral, resp. rate (!) 21, height 5\' 10"  (1.778 m), weight 210 lb (95.3 kg), SpO2 100 %. GENERAL- alert, co-operative, NAD HEENT- Atraumatic, PERRL, EOMI, oral mucosa appears moist, good and intact dentition, no carotid bruit, no cervical LN enlargement. CARDIAC- RRR, no murmurs, rubs or gallops. RESP- CTAB, no wheezes or crackles. ABDOMEN- Soft, nontender, no guarding or rebound, normoactive bowel sounds present BACK- Normal curvature, no paraspinal tenderness, no CVA tenderness. NEURO- No obvious Cr N abnormality, strength upper and lower extremities- 5/5, Sensation intact globally EXTREMITIES- pulse 2+, symmetric, no pedal edema. SKIN- Warm, dry, No rash or lesion. PSYCH- Normal mood and affect, appropriate thought content and speech.   EKG: personally reviewed my interpretation is sinus rhythm at a rate of 99 bpm with LVH and anterior lead Q waves, with normalization of T waves since previous tracing from 07/2017  CXR: personally reviewed my interpretation is bilateral vascular congestion and increased interstitial markings with small left pleural effusion.  Assessment & Plan by Problem: Hypertensive urgency Chest pain HFpEF It is unclear what precipitated this worsening hypertension. He denies medication  or diet changes to me and reports attending HD uneventfully Saturday. He does not have any more chest pain at this time. Troponin elevation of 0.07 is less specific for cardiac ischemia in the setting of ESRD. -Continue home amlodipine 10mg  -Repeat troponin 0.07-> -PRN labetalol 10mg  IV q2hrs PRN for SBP > 180 -Cardiac monitoring -Repeat labs in AM  ESRD on HD TTS He appears volume overloaded on examination and imaging so volume removal will likely help symptoms. Nephrology consulted for his dialysis which would routinely be today  Excessive somnolence Unclear cause, possibly fatigue from minimal sleep at night for several days. He is not impressively uremic and has no neurological changes. He is oriented when prompted and follows all commands. Clinical observation is appropriate and see if it improves with his other symptoms.  VTE ppx: [place] heparin FEN: Renal FULL CODE  Dispo: Admit patient to Inpatient with expected length of stay greater than 2 midnights.  Signed: Collier Salina, MD PGY-III Internal Medicine Resident Pager# [phone] 12/01/2017, 2:05 PM

## 2017-12-01 NOTE — ED Triage Notes (Signed)
Pt arrived from home via [facility] with complaints of SOB, breath sounds in transport wheezing, diminished; received albuterol and solumedrol, triggered productive cough of clear sputum en route for labored breathing EMS EKG irregular   Smoker; dialysis pt; denied chest pain en route but began to complain of chest pain upon arrival

## 2017-12-02 ENCOUNTER — Inpatient Hospital Stay (HOSPITAL_COMMUNITY): Payer: Medicare Other

## 2017-12-02 DIAGNOSIS — I5043 Acute on chronic combined systolic (congestive) and diastolic (congestive) heart failure: Secondary | ICD-10-CM

## 2017-12-02 DIAGNOSIS — I5033 Acute on chronic diastolic (congestive) heart failure: Secondary | ICD-10-CM

## 2017-12-02 DIAGNOSIS — I340 Nonrheumatic mitral (valve) insufficiency: Secondary | ICD-10-CM

## 2017-12-02 DIAGNOSIS — I132 Hypertensive heart and chronic kidney disease with heart failure and with stage 5 chronic kidney disease, or end stage renal disease: Principal | ICD-10-CM

## 2017-12-02 LAB — ECHOCARDIOGRAM COMPLETE
HEIGHTINCHES: 70 in
Weight: 3114.66 oz

## 2017-12-02 LAB — BASIC METABOLIC PANEL
ANION GAP: 18 — AB (ref 5–15)
BUN: 21 mg/dL — ABNORMAL HIGH (ref 6–20)
CALCIUM: 8.4 mg/dL — AB (ref 8.9–10.3)
CO2: 23 mmol/L (ref 22–32)
CREATININE: 6.53 mg/dL — AB (ref 0.61–1.24)
Chloride: 94 mmol/L — ABNORMAL LOW (ref 101–111)
GFR, EST AFRICAN AMERICAN: 9 mL/min — AB (ref >60)
GFR, EST NON AFRICAN AMERICAN: 8 mL/min — AB (ref >60)
GLUCOSE: 114 mg/dL — AB (ref 65–99)
Potassium: 3.6 mmol/L (ref 3.5–5.1)
Sodium: 135 mmol/L (ref 135–145)

## 2017-12-02 LAB — HIV ANTIBODY (ROUTINE TESTING W REFLEX): HIV Screen 4th Generation wRfx: NONREACTIVE

## 2017-12-02 LAB — MRSA PCR SCREENING: MRSA by PCR: NEGATIVE

## 2017-12-02 MED ORDER — IPRATROPIUM-ALBUTEROL 0.5-2.5 (3) MG/3ML IN SOLN: 3.0000 mL | RESPIRATORY_TRACT | Status: DC | PRN

## 2017-12-02 MED ORDER — IPRATROPIUM-ALBUTEROL 0.5-2.5 (3) MG/3ML IN SOLN
3.0000 mL | RESPIRATORY_TRACT | Status: DC | PRN
  Administered 2017-12-02: 3 mL via RESPIRATORY_TRACT

## 2017-12-02 NOTE — Significant Event (Signed)
Rapid Response Event Note RT called to meet them in HD  Overview: Time Called: 0210 Arrival Time: 0215 Event Type: Respiratory  Initial Focused Assessment: On arrival pt lying on his Right side in an upright position. Alert x3, states he's "just tired." pt receiving dialysis treatment. Audible wheezing noted from the entrance of his HD bay. Duo Neb given. HD RN reports sats mid 90's. He placed pt on NRB for pt restless and wheezing.   Interventions: Duo Neb given  Plan of Care (if not transferred): Administer PRN nebs as ordered. Call RRT as needed  Event Summary: Name of Physician Notified: Dr. Berline Lopes  at [place]    at    Outcome: Stayed in room and stabalized     [place], [place]

## 2017-12-02 NOTE — Progress Notes (Signed)
Pharmacists Programme researcher, broadcasting/film/video and students) rounding with the [employer] asked to address alternatives or additions to current JNC-8 maximum drug therapy of amlodipine 10mg  PO every day. Discussed with Dr. Johny Chess potential options of JNC8. Given the concomitant clinical condition of heart failure in addition to the hypertension, the role of BiDil (hydralazine + isosorbide dinitrate) was discussed based upon clinical outcomes data and current guidelines which suggest addition of a second drug at this point. This drug has been studied in this patient population and has been demonstrated to have efficacy and safety.   Der Max Fickle, PharmD Candidate Waverly Ferrari, PharmD Candidate Jorene Guest, PharmD, CACP, CPP

## 2017-12-02 NOTE — Progress Notes (Signed)
Patient arrived to unit per bed.  Reviewed treatment plan and this RN agrees.  Report received from bedside RN, Ronnie.  Consent obtained.  Patient A & O X 4. Lung sounds coarse to ausculation in all fields. Generalized edema. Cardiac: ST.  Prepped LUAVF with alcohol and cannulated with two 15 gauge needles.  Pulsation of blood noted.  Flushed access well with saline per protocol.  Connected and secured lines and initiated tx at [place].  UF goal of 5500 mL and net fluid removal of 5000 mL.  Will continue to monitor.

## 2017-12-02 NOTE — Progress Notes (Signed)
  Echocardiogram 2D Echocardiogram has been performed.  Kenneth [name] 12/02/2017, 2:08 PM

## 2017-12-02 NOTE — Progress Notes (Signed)
   Subjective: Pt admitted yesterday and had HD performed overnight. He states his breathing has improved following his dialysis session, reports persistent chest pain that was present on admission.   Objective:  Vital signs in last 24 hours: Vitals:   12/02/17 0430 12/02/17 0440 12/02/17 0443 12/02/17 0617  BP: (!) 157/97 (!) 155/102 (!) 170/110 (!) 148/88  Pulse: (!) 112 (!) 101 (!) 101 81  Resp:  17  18  Temp:  98 F (36.7 C)  98 F (36.7 C)  TempSrc:    Oral  SpO2:    98%  Weight:  194 lb 10.7 oz (88.3 kg)  194 lb 10.7 oz (88.3 kg)  Height:       General: Resting in bed comfortably , no acute distress HEENT: PERRL, moist mucus membranes CV: RRR, s1, s2  Resp: Clear breath sounds bilaterally, normal work of breathing, no distress  Abd: Soft, +BS, non-tender  Extr: No LE edema Neuro: Alert and oriented x3  Skin: Warm, dry    Assessment/Plan:  Hypertensive Urgency, Volume Overload H/o Heart Failure  Pt presented with acutely elevated BP >546 systolic with signs of volume overload despite no missed HD session. He reports persistent chest pain since admission though ACS has been ruled out with stable troponin (0.07 in setting of ESRD) and no acute EKG changes. Following HD, his BP has improved, last 110/67. He is only on amlodipine 10 mg at home for BP and has a history of hypotension, syncope presentations with his HD sessions, he was previously on Coreg. His BP seems to have large variations making control difficult which is complicated by the pt not having a PCP and health literacy of the pt. Other medications including ACE/ARB, BB, Bidil would provide theoretical benefit given HF hx.  --Monitor vital signs  --Cont home Amlodipine --Repeat echo (last 2017 EF 55%, previously reduced EF)  --Establish PCP for f/u   ESRD on TThSa HD Pt had not missed any HD sessions but presented volume overloaded. He tolerated inpatient HD session overnight with 5 L removed and improvement in  his clinical status and BP. Appreciate Nephrology assistance.    Dispo: Anticipated discharge in approximately 1-2 day(s).   Tawny Asal, MD 12/02/2017, 6:42 AM Pager: [phone]

## 2017-12-02 NOTE — Progress Notes (Signed)
Medicine attending: I examined this patient today together with resident physician Dr. Tawny Asal and I concur with his evaluation and management plan which we discussed together. Please see separate attending admission note for complete details. Patient condition stable overnight.  We are attempting to optimize his blood pressure control.  Blood pressure has been labile during dialysis sessions with episodes of hypotension and syncope.  We will discuss with nephrology but anticipate adding back a beta-blocker or hydralazine.  Continue amlodipine for now.

## 2017-12-02 NOTE — Progress Notes (Signed)
[facility] Progress Note  Subjective: feeling much better  Vitals:   12/02/17 0440 12/02/17 0443 12/02/17 0617 12/02/17 0800  BP: (!) 155/102 (!) 170/110 (!) 148/88 127/74  Pulse: (!) 101 (!) 101 81 (!) 103  Resp: 17  18 18   Temp: 98 F (36.7 C)  98 F (36.7 C)   TempSrc:   Oral   SpO2:   98% 95%  Weight: 88.3 kg (194 lb 10.7 oz)  88.3 kg (194 lb 10.7 oz)   Height:        Inpatient medications: . amLODipine  10 mg Oral QHS  . calcium acetate  1,334 mg Oral TID WC  . cinacalcet  90 mg Oral Q T,Th,Sat-1800  . heparin  5,000 Units Subcutaneous Q8H  . multivitamin  1 tablet Oral QHS    acetaminophen, ipratropium-albuterol, labetalol, nitroGLYCERIN  Exam: General: WDWN NAD Head: NCAT sclera not icteric MMM Neck: Supple. No JVD No masses Lungs: clear bilat Heart: RRR no mrg Abdomen: soft NT + BS Lower extremities:without edema or ischemic changes, no open wounds  Neuro: A & O  X 3. Moves all extremities spontaneously. Psych:  Responds to questions appropriately with a normal affect. Dialysis Access: LUE AVF +bruit/thrill     Dialysis:  [facility] TTS 4.25h 180F BFR 400/800 EDW 87kg 2K/2Ca Hep 2400 L AVF Venofer 50mg  IV q week Calcitriol 2.71mcg PO TIW Sensipar 90mg  PO TIW        Impression: 1  HTN urgency, better 2  SOB/ pulm edema, 5 L off, close to dry wt 3  ESRD HD tts 4  Anemia ckd, stable no esa 5  MBD cont meds 6  CM/ CHF as above 7  dispo - close to dry, on no oxygen, OK for dc from renal standpoint  Plan - as above  Kelly Splinter MD [place] pager [phone]   12/02/2017, 11:31 AM   Recent Labs  Lab 12/01/17 0840 12/02/17 0536  NA 142 135  K 4.1 3.6  CL 106 94*  CO2 18* 23  GLUCOSE 96 114*  BUN 51* 21*  CREATININE 13.35* 6.53*  CALCIUM 9.1 8.4*   No results for input(s): AST, ALT, ALKPHOS, BILITOT, PROT, ALBUMIN in the last 168 hours. Recent Labs  Lab 12/01/17 0840  WBC 6.1  HGB 10.8*  HCT 32.2*  MCV 99.7   PLT 192   Iron/TIBC/Ferritin/ %Sat    Component Value Date/Time   IRON 51 05/22/2016 0414   TIBC 262 05/22/2016 0414   FERRITIN 113 05/22/2016 0414   IRONPCTSAT 19 05/22/2016 0414

## 2017-12-02 NOTE — Progress Notes (Signed)
MD made aware of 15 bts wide QRS.Patient asymptomatic, no complaints of ay pain or discomfort.

## 2017-12-02 NOTE — Progress Notes (Signed)
Dialysis treatment completed.  5500 mL ultrafiltrated and net fluid removal 5000 mL.    Patient status unchanged. Lung sounds diminished to ausculation in all fields. No edema. Cardiac: NSR to ST.  Disconnected lines and removed needles.  Pressure held for 10 minutes and band aid/gauze dressing applied.  Report given to bedside RN, Edd Arbour.

## 2017-12-03 DIAGNOSIS — R0602 Shortness of breath: Secondary | ICD-10-CM

## 2017-12-03 DIAGNOSIS — I4891 Unspecified atrial fibrillation: Secondary | ICD-10-CM

## 2017-12-03 DIAGNOSIS — I5041 Acute combined systolic (congestive) and diastolic (congestive) heart failure: Secondary | ICD-10-CM

## 2017-12-03 DIAGNOSIS — I160 Hypertensive urgency: Secondary | ICD-10-CM

## 2017-12-03 DIAGNOSIS — I4892 Unspecified atrial flutter: Secondary | ICD-10-CM

## 2017-12-03 LAB — TSH: TSH: 0.825 u[IU]/mL (ref 0.350–4.500)

## 2017-12-03 MED ORDER — CALCITRIOL 0.5 MCG PO CAPS: 2.5000 ug | ORAL_CAPSULE | ORAL | Status: DC

## 2017-12-03 MED ORDER — METOPROLOL TARTRATE 12.5 MG HALF TABLET
12.5000 mg | ORAL_TABLET | Freq: Two times a day (BID) | ORAL | Status: DC
  Administered 2017-12-03 – 2017-12-04 (×2): 12.5 mg via ORAL
  Filled 2017-12-03 (×3): qty 1

## 2017-12-03 MED ORDER — GUAIFENESIN-DM 100-10 MG/5ML PO SYRP
5.0000 mL | ORAL_SOLUTION | ORAL | Status: DC | PRN
  Administered 2017-12-03 – 2017-12-04 (×3): 5 mL via ORAL
  Filled 2017-12-03 (×3): qty 5

## 2017-12-03 NOTE — Care Management Note (Addendum)
Case Management Note  Patient Details  Name: Kenneth [name] MRN: [id number] Date of Birth: Mar 12, 1956  Subjective/Objective:  HTN Urgency                 Action/Plan: CM talked to patient about not having a PCP; patient is agreeable to have the CM assist him in finding a new PCP; apt made with Dr Grier Mitts with [facility] for Dec 22, 2017 at 2 pm; he has private insurance with Medicare / Medicaid with prescription drug coverage; pharmacy of choice is [facility].  Expected Discharge Date:     Possibly 12/05/2017             Expected Discharge Plan:  Home/Self Care  Discharge planning Services  CM Consult, Follow-up appt scheduled  Status of Service:  Completed, signed off  Sherrilyn Rist [phone] 12/03/2017, 11:03 AM

## 2017-12-03 NOTE — Progress Notes (Signed)
Medicine attending: I examined this patient today together with resident physician Dr. Tawny Asal and I concur with his evaluation and management plan. The patient went into rapid atrial fibrillation/flutter.  Evaluation and treatment in progress.  He received parenteral metoprolol.  In addition, he had a run of non-sustained ventricular tachycardia.  He appears comfortable at this time and does not noticed that he is having palpitations.  Lungs are overall clear.  No JVD.  Irregularly irregular cardiac rhythm. Blood pressures have been labile and difficult to control due to swings between hypertension and hypotension.  He just moved here 4 years ago from [place].  He has not established with a primary care physician or a cardiologist. Echocardiogram this admission shows hyperdynamic hypertrophied left ventricle EF 65-70% with grade 1 diastolic dysfunction, severely dilated left atrium.  Moderate mitral regurgitation. Would like to get a cardiology opinion for advice on managing his medication and to see if he would benefit by electrophysiologic testing for potential radiofrequency ablation procedure or maze procedure. He continues on dialysis for his end-stage renal disease.

## 2017-12-03 NOTE — Progress Notes (Signed)
   Subjective: No acute events overnight. Paged this morning for tachycardia with rates in 140s, atrial fibrillation with RVR on EKG. His BP was in 140s at the time and IV Metoprolol administered with some improvement. Also had non-sustained run of VT overnight, asymptomatic at the time. He denies feelings of palpitations, light-headedness, dizziness. Notes chest pain with coughing.    He reports he lives with his daughter who helps with his care, she has not been in hospital during this admission.   Objective:  Vital signs in last 24 hours: Vitals:   12/02/17 1208 12/02/17 2059 12/03/17 0514 12/03/17 0730  BP: 110/67 (!) 145/85 (!) 131/98 (!) 143/94  Pulse: (!) 105 93 88 (!) 144  Resp: 20 20 18    Temp: 98.2 F (36.8 C) 99 F (37.2 C) 98.3 F (36.8 C)   TempSrc: Oral Oral Oral   SpO2: 92% 94% 95%   Weight:   194 lb 6.4 oz (88.2 kg)   Height:       General: Resting in bed comfortably , no acute distress HEENT: PERRL, moist mucus membranes CV: Irregularly irregular tachycardic  Resp: Clear breath sounds bilaterally, normal work of breathing, no distress  Abd: Soft, +BS, non-tender  Extr: No LE edema Neuro: Alert and oriented x3  Skin: Warm, dry     Assessment/Plan:  Hypertensive Urgency, Volume Overload H/o Heart Failure  Pt presented with acutely elevated BP >185 systolic with signs of volume overload despite no missed HD session. Following HD, his BP has improved, last 110/67. Repeat echo this admission showed EF 65-70%, LVH with grade 1 DD, LA severely dilated. He is only on amlodipine 10 mg at home for BP and has a history of hypotension, syncope presentations with his HD sessions, he was previously on Coreg. His BP seems to have large variations making control difficult which is complicated by the pt not having a PCP and health literacy of the pt. Given his arrhythmia, potentially re-introducing beta blockade on non-HD days to see if pt tolerates may be beneficial. --Monitor  vital signs  --Cont home Amlodipine --Volume management with HD   Atrial Fibrillation with RVR, H/o nonsustained VT and a flutter Pt went in to a fib with RVR, asymptomatic with underlying LA dilation on echo. On chart review, he has had non-sustained VT in the past, as well as atrial flutter. Coreg was intended to help control these issues, but was limited by hypotension with HD. He received a dose of IV Lopressor but adequate control of his rate may be difficult with labile blood pressures.  --Tele --IV Lopressor prn  --Cardiology consult  ESRD on TThSa HD Pt had not missed any HD sessions but presented volume overloaded. He tolerated inpatient HD session overnight with 5 L removed and improvement in his clinical status and BP. Appreciate Nephrology assistance.    Dispo: Anticipated discharge in approximately 1-2 day(s).   Tawny Asal, MD 12/03/2017, 7:34 AM Pager: [phone]

## 2017-12-03 NOTE — Progress Notes (Signed)
[facility] Progress Note  Subjective: no new c/o  Vitals:   12/02/17 1208 12/02/17 2059 12/03/17 0514 12/03/17 0730  BP: 110/67 (!) 145/85 (!) 131/98 (!) 143/94  Pulse: (!) 105 93 88 (!) 144  Resp: 20 20 18    Temp: 98.2 F (36.8 C) 99 F (37.2 C) 98.3 F (36.8 C)   TempSrc: Oral Oral Oral   SpO2: 92% 94% 95%   Weight:   88.2 kg (194 lb 6.4 oz)   Height:        Inpatient medications: . amLODipine  10 mg Oral QHS  . calcium acetate  1,334 mg Oral TID WC  . cinacalcet  90 mg Oral Q T,Th,Sat-1800  . heparin  5,000 Units Subcutaneous Q8H  . multivitamin  1 tablet Oral QHS    acetaminophen, guaiFENesin-dextromethorphan, ipratropium-albuterol, labetalol, nitroGLYCERIN  Exam: General: WDWN NAD Head: NCAT sclera not icteric MMM Neck: Supple. No JVD No masses Lungs: clear bilat Heart: RRR no mrg Abdomen: soft NT + BS Lower extremities:without edema or ischemic changes, no open wounds  Neuro: A & O  X 3. Moves all extremities spontaneously. Psych:  Responds to questions appropriately with a normal affect. Dialysis Access: LUE AVF +bruit/thrill     Dialysis:  [facility] TTS 4.25h 180F BFR 400/800 EDW 87kg 2K/2Ca Hep 2400 L AVF Venofer 50mg  IV q week Calcitriol 2.69mcg PO TIW Sensipar 90mg  PO TIW        Impression: 1  HTN urgency - imprvoed 2  SOB/ pulm edema/+8kg  / vol overload - now at dry wt, resolved 3  Afib/ RVR - cards consulted 4  ESRD HD TTS 5  Anemia ckd, stable no esa 6  CM/ CHF as above 7  Dispo - per primary  Plan - as above  Kelly Splinter MD [place] pager [phone]   12/03/2017, 1:52 PM   Recent Labs  Lab 12/01/17 0840 12/02/17 0536  NA 142 135  K 4.1 3.6  CL 106 94*  CO2 18* 23  GLUCOSE 96 114*  BUN 51* 21*  CREATININE 13.35* 6.53*  CALCIUM 9.1 8.4*   No results for input(s): AST, ALT, ALKPHOS, BILITOT, PROT, ALBUMIN in the last 168 hours. Recent Labs  Lab 12/01/17 0840  WBC 6.1  HGB 10.8*  HCT 32.2*   MCV 99.7  PLT 192   Iron/TIBC/Ferritin/ %Sat    Component Value Date/Time   IRON 51 05/22/2016 0414   TIBC 262 05/22/2016 0414   FERRITIN 113 05/22/2016 0414   IRONPCTSAT 19 05/22/2016 0414

## 2017-12-03 NOTE — Consult Note (Signed)
Cardiology Consultation:   Patient ID: Moo Gravley; [id number]; Dec 15, 1955   Admit date: 12/01/2017 Date of Consult: 12/03/2017  Primary Care Provider: Patient, No Pcp Per Primary Cardiologist: Peter [name], MD   Patient Profile:   Raife Lizer is a 62 y.o. male with a hx of NSVT, cardiomyopathy EF 25-30% 2017, ESRD on HD TThS, HTN, ETOH who is being seen today for the evaluation of atrial fibrillation at the request of Dr. Beryle Beams.  History of Present Illness:   Mr. Kretschmer was admitted on 12/01/2017 for shortness of breath, wheezing and chest pain. He had a productive cough. He continues to smoke cigarettes. CXR showed mild interstitial edema. Presumed COPD exacerbation. He had hypertensive urgency with SBP >210. He underwent dialysis that day. Dyspnea and volume improved.   His troponin was 0.07 and he had no acute EKG changes. BP improved after dialysis and he is continued on home amlodipine.   This am this patient developed atrial fibrillation with RVR and we were asked to consult for management considering his previous intolerance to beta blockers with hypotension and syncope. He also had some NSVT overnight but was asymptomatic.   Upon telemetry review, overnight the patient was in sinus tachycardiac with PAC's, rates around 100 bpm. At about 0630 this am he developed atrial fibrillation in the 120's-160's. He was given a dose of labetalol 10 mg at 0749. At about 10:30 he converted back to sinus rhythm in the 90's with PAC's and PVC's.   An echocardiogram done yesterday showed moderate LVH, EF 65-70%, no regional wall motion abnormalities, grade 1 DD, severely dilated LA and mildly dilated RA, mild-mod MR.   Upon my assessment the patient had no awareness of the episode of afib this am. He says that he has chest pain, but only when he coughs. He denies shortness of breath or lightheadedness. He denies noting any palpitations at home. He has a history of falls and thinks that he has  fallen once in the last 6 months. The patient seems to have little insight into his health. When asked about what was different that may have led to his fluid overload he says that he was eating salt straight from the salt shaker and he was eating ice. I'm not sure how reliable he is. He denies alcohol use except for an occasional beer. He smokes about 5 cigars per day, no cigarettes.   During a hospital admission in 04/2016 the patient was found to have new atrial fib/flutter with acute on chronic combined CHF. Echocardiogram 05/12/16 showed moderate LVH, EF 25-30%, diffuse hypokinesis, moderate MR, severely dilated LA. EF was down from previous 60-65% in 2015. Pt was not felt to be a candidate for anticoagulation. He was placed on Coreg but this had to be discontinued due to hypotension.  Follow up Echo In December 2017 showed normalization of EF to 55-60%.   Mr Trulson was admitted in 06/2016 for hypoglycemia, vol mgt w/ HD. He was seen for  NSVT>>BB increased.  Myoview 07/07/2016 showed scar but no ischemia and EF 30-44%.  He was last ween in our office on 04/15/2017 by Dr. [name]. He was noted to be off carvedilol due to hypotension with dialysis. He was having no palpitations, presyncope or other symptoms of arrhythmia.  Past Medical History:  Diagnosis Date  . Cardiomyopathy secondary    likely related to HTN heart disease; possibly ETOH related as well  . Chronic combined systolic and diastolic heart failure (HCC)    Echocardiogram 09/22/11: Moderate LVH,  EF 37-90%, grade 3 diastolic dysfunction, mild MR, moderate to severe LAE, mild RVE, mild to moderate TR, small to moderate pericardial effusion  . CKD (chronic kidney disease) stage 4, GFR 15-29 ml/min (HCC)    renal failure; due to hypertensive nephrosclerosis  . History of alcohol abuse   . Hypertension     Past Surgical History:  Procedure Laterality Date  . AV FISTULA PLACEMENT Left 05/14/2016   Procedure: LEFT ARM BASILIC VEIN  TRANSPOSITION;  Surgeon: Rosetta Posner, MD;  Location: [facility];  Service: Vascular;  Laterality: Left;  . PERIPHERAL VASCULAR CATHETERIZATION N/A 05/13/2016   Procedure: Dialysis/Perma Catheter Insertion;  Surgeon: Serafina Mitchell, MD;  Location: [facility];  Service: Cardiovascular;  Laterality: N/A;     Home Medications:  Prior to Admission medications   Medication Sig Start Date End Date Taking? Authorizing Provider  acetaminophen (TYLENOL) 325 MG tablet Take 2 tablets (650 mg total) by mouth every 6 (six) hours as needed for mild pain (or Fever >/= 101). 10/11/16  Yes Domenic Polite, MD  amLODipine (NORVASC) 10 MG tablet Take 10 mg by mouth at bedtime.   Yes [provider]  calcium acetate (PHOSLO) 667 MG capsule Take 1,334-2,001 mg by mouth See admin instructions. 2,001 mg three times a day with meals and 1,334 mg two times a day with snacks   Yes [provider]  diphenhydramine-acetaminophen (TYLENOL PM) 25-500 MG TABS tablet Take 1 tablet by mouth at bedtime as needed (for sleep).   Yes [provider]    Inpatient Medications: Scheduled Meds: . amLODipine  10 mg Oral QHS  . calcium acetate  1,334 mg Oral TID WC  . cinacalcet  90 mg Oral Q T,Th,Sat-1800  . heparin  5,000 Units Subcutaneous Q8H  . multivitamin  1 tablet Oral QHS   Continuous Infusions:  PRN Meds: acetaminophen, guaiFENesin-dextromethorphan, ipratropium-albuterol, labetalol, nitroGLYCERIN  Allergies:   No Known Allergies  Social History:   Social History   Socioeconomic History  . Marital status: Married    Spouse name: Not on file  . Number of children: Not on file  . Years of education: Not on file  . Highest education level: Not on file  Social Needs  . Financial resource strain: Not on file  . Food insecurity - worry: Not on file  . Food insecurity - inability: Not on file  . Transportation needs - medical: Not on file  . Transportation needs - non-medical: Not on  file  Occupational History  . Not on file  Tobacco Use  . Smoking status: Former Smoker    Types: Cigars    Last attempt to quit: 11/27/2016    Years since quitting: 1.0  . Smokeless tobacco: Former Network engineer and Sexual Activity  . Alcohol use: No    Frequency: Never    Comment: occ  . Drug use: No  . Sexual activity: Not on file  Other Topics Concern  . Not on file  Social History Narrative  . Not on file    Family History:    Family History  Problem Relation Age of Onset  . Emphysema Mother   . Cirrhosis Father      ROS:  Please see the history of present illness.   All other ROS reviewed and negative.     Physical Exam/Data:   Vitals:   12/02/17 1208 12/02/17 2059 12/03/17 0514 12/03/17 0730  BP: 110/67 (!) 145/85 (!) 131/98 (!) 143/94  Pulse: Marland Kitchen)  105 93 88 (!) 144  Resp: 20 20 18    Temp: 98.2 F (36.8 C) 99 F (37.2 C) 98.3 F (36.8 C)   TempSrc: Oral Oral Oral   SpO2: 92% 94% 95%   Weight:   194 lb 6.4 oz (88.2 kg)   Height:        Intake/Output Summary (Last 24 hours) at 12/03/2017 1351 Last data filed at 12/03/2017 0900 Gross per 24 hour  Intake 480 ml  Output 320 ml  Net 160 ml   Filed Weights   12/02/17 0440 12/02/17 0617 12/03/17 0514  Weight: 194 lb 10.7 oz (88.3 kg) 194 lb 10.7 oz (88.3 kg) 194 lb 6.4 oz (88.2 kg)   Body mass index is 27.89 kg/m.  General:  Well nourished, well developed, in no acute distress HEENT: normal Neck: no JVD Vascular: No carotid bruits; FA pulses 2+ bilaterally  Cardiac:  normal S1, S2; irregularly irregular rhythm; 2/6 apical murmur Lungs:  Faint expiratory wheezing throughout, no rhonchi or rales  Abd: soft, nontender, no hepatomegaly  Ext: no edema Musculoskeletal:  No deformities, BUE and BLE strength normal and equal Skin: warm and dry  Neuro:  CNs 2-12 intact, no focal abnormalities noted Psych:  Normal affect   EKG:  The EKG was personally reviewed and demonstrates:  Atrial fibrillation at 138  bpm Telemetry:  Telemetry was personally reviewed and demonstrates:  sr in the 90's. Had afib with RVR from about 0630-10:30  Relevant CV Studies:  Echocardiogram 12/02/2017 Study Conclusions  - Left ventricle: The cavity size was normal. Wall thickness was   increased in a pattern of moderate LVH. Systolic function was   vigorous. The estimated ejection fraction was in the range of 65%   to 70%. Wall motion was normal; there were no regional wall   motion abnormalities. Doppler parameters are consistent with   abnormal left ventricular relaxation (grade 1 diastolic   dysfunction). - Aortic valve: Valve area (VTI): 2.59 cm^2. Valve area (Vmax):   2.83 cm^2. Valve area (Vmean): 2.64 cm^2. - Mitral valve: There was mild to moderate regurgitation. - Left atrium: The atrium was severely dilated. - Right atrium: The atrium was mildly dilated.   Echocardiogram 10/09/16 Study Conclusions  - Left ventricle: The cavity size was normal. Wall thickness was   increased in a pattern of moderate LVH. Systolic function was   normal. The estimated ejection fraction was in the range of 55%   to 60%. Wall motion was normal; there were no regional wall   motion abnormalities. Doppler parameters are consistent with   abnormal left ventricular relaxation (grade 1 diastolic   dysfunction).  Echocardiogram 05/12/16 Study Conclusions - Left ventricle: The cavity size was mildly dilated. There was   moderate concentric hypertrophy. Systolic function was severely   reduced. The estimated ejection fraction was in the range of 25%   to 30%. Diffuse hypokinesis. The study was not technically   sufficient to allow evaluation of LV diastolic dysfunction due to   atrial flutter. - Aortic root: The aortic root was normal in size. - Ascending aorta: The ascending aorta was normal in size. - Mitral valve: There was moderate regurgitation. - Left atrium: The atrium was severely dilated. - Right ventricle: The  cavity size was mildly dilated. Wall   thickness was normal. Systolic function was moderately reduced. - Right atrium: The atrium was moderately dilated. - Tricuspid valve: There was moderate-severe regurgitation. - Pulmonary arteries: Systolic pressure was severely increased. PA  peak pressure: 62 mm Hg (S). - Inferior vena cava: The vessel was normal in size. - Pericardium, extracardiac: A trivial pericardial effusion was   identified posterior to the heart. Features were not consistent   with tamponade physiology.  Impressions: - When compared to the prior study from 12/09/2013 LVEF has   decreased, previously 60-65%, now 25-30% with diffuse   hypokinesis.   There is also a moderate RV systolic dysfunction.   Severe pulmonary hypertension is new.  Myoview 07/07/2016  There was no ST segment deviation noted during stress.  Defect 1: There is a medium defect of moderate severity present in the basal inferior, mid inferior and apical inferior location.  Findings consistent with prior myocardial infarction and possible soft tissue attenuation. No significant ischemia  This is an intermediate risk study.  The left ventricular ejection fraction is moderately decreased (30-44%).  Radioactivity activity noted in neck region on raw images. Recommend thyroid ultrasound to define   Laboratory Data:  Chemistry Recent Labs  Lab 12/01/17 0840 12/02/17 0536  NA 142 135  K 4.1 3.6  CL 106 94*  CO2 18* 23  GLUCOSE 96 114*  BUN 51* 21*  CREATININE 13.35* 6.53*  CALCIUM 9.1 8.4*  GFRNONAA 3* 8*  GFRAA 4* 9*  ANIONGAP 18* 18*    No results for input(s): PROT, ALBUMIN, AST, ALT, ALKPHOS, BILITOT in the last 168 hours. Hematology Recent Labs  Lab 12/01/17 0840  WBC 6.1  RBC 3.23*  HGB 10.8*  HCT 32.2*  MCV 99.7  MCH 33.4  MCHC 33.5  RDW 15.6*  PLT 192   Cardiac Enzymes Recent Labs  Lab 12/01/17 1523 12/01/17 2134  TROPONINI 0.07* 0.06*    Recent Labs  Lab  12/01/17 0926  TROPIPOC 0.08    BNP Recent Labs  Lab 12/01/17 0921  BNP 1,765.6*    DDimer No results for input(s): DDIMER in the last 168 hours.  Radiology/Studies:  Dg Chest 2 View  Result Date: 12/01/2017 CLINICAL DATA:  Shortness of breath and cough EXAM: CHEST  2 VIEW COMPARISON:  July 28, 2017 FINDINGS: There is interstitial pulmonary edema, mainly in the lower lobe regions. There is no airspace consolidation. There is a minimal left pleural effusion. There is cardiomegaly. There is borderline pulmonary venous hypertension. No adenopathy. There is aortic atherosclerosis. There is degenerative change in each shoulder. IMPRESSION: Cardiomegaly with borderline pulmonary vascular congestion. Mild interstitial edema. No consolidation. Aortic atherosclerosis noted. Aortic Atherosclerosis (ICD10-I70.0). Electronically Signed   By: Lowella Grip III M.D.   On: 12/01/2017 09:05   Ct Head Wo Contrast  Result Date: 12/01/2017 CLINICAL DATA:  Wheezing, chest pain EXAM: CT HEAD WITHOUT CONTRAST TECHNIQUE: Contiguous axial images were obtained from the base of the skull through the vertex without intravenous contrast. COMPARISON:  10/07/2016 FINDINGS: Brain: Old lacunar infarcts in the basal ganglia. There is atrophy and chronic small vessel disease changes. No acute intracranial abnormality. Specifically, no hemorrhage, hydrocephalus, mass lesion, acute infarction, or significant intracranial injury. Vascular: No hyperdense vessel or unexpected calcification. Skull: No acute calvarial abnormality. Sinuses/Orbits: No acute finding Other: None IMPRESSION: No acute intracranial abnormality. Atrophy, chronic microvascular disease. Old bilateral basal ganglia lacunar infarcts. Electronically Signed   By: Rolm Baptise M.D.   On: 12/01/2017 10:53    Assessment and Plan:   Atrial fibrillation -Pt has history of atrial fib/flutter in 2017. Unable to tolerate carvedilol due to hypotension with dialysis.  Had decreased CHF with EF in 2017, 25-30% but recovered to  normal. Has done well until this AM developed afib with rates in 120's-160's. Was given labetalol IV and converted to SR with PAC's and PVC's. -Pt had no awareness of being in afib. Unknown if he has been having episodes of afib prior to this.  -Pt not felt to be a candidate for anticoagulation in the past due to falls and low health literacy.  -Pt had problems tolerating carvedilol in the past due to hypotension with dialysis. Carvedilol was used in the setting of decreased EF which normalized. I think we could try metoprolol tartrate low dose with 12.5 mg BID and if needed can hold his dose on the mornings of dialysis days.   Hypertension  -Pt admitted with hypertensive urgency, SBP >210. BP improved with IV hydralazine and labetalol and dialysis.  -Currently fairly well controlled on amlodipine. Has room to add metoprolol.  Volume overload -Pt admitted with hypertensive urgency. Had not missed any dialysis sessions. CXR showed mild interstitial edema. Improved with dialysis. Pt has hx of low EF in 2017, but this recovered. Echo yesterday shows preserved EF.  -Pt tells me that he was eating straight salt that contributed to his fluid overload, however, unsure of how reliable pt is.  ESRD -HD TThS. Managed by nephrology.   For questions or updates, please contact [place] Please consult [email or web address] for contact info under Cardiology/STEMI.   Signed, Daune Perch, NP  12/03/2017 1:51 PM

## 2017-12-04 DIAGNOSIS — N186 End stage renal disease: Secondary | ICD-10-CM

## 2017-12-04 DIAGNOSIS — I472 Ventricular tachycardia: Secondary | ICD-10-CM

## 2017-12-04 DIAGNOSIS — Z992 Dependence on renal dialysis: Secondary | ICD-10-CM

## 2017-12-04 LAB — RENAL FUNCTION PANEL
ANION GAP: 17 — AB (ref 5–15)
Albumin: 3.1 g/dL — ABNORMAL LOW (ref 3.5–5.0)
BUN: 61 mg/dL — ABNORMAL HIGH (ref 6–20)
CHLORIDE: 93 mmol/L — AB (ref 101–111)
CO2: 24 mmol/L (ref 22–32)
Calcium: 9.5 mg/dL (ref 8.9–10.3)
Creatinine, Ser: 12.54 mg/dL — ABNORMAL HIGH (ref 0.61–1.24)
GFR calc non Af Amer: 4 mL/min — ABNORMAL LOW (ref >60)
GFR, EST AFRICAN AMERICAN: 4 mL/min — AB (ref >60)
Glucose, Bld: 89 mg/dL (ref 65–99)
Phosphorus: 6.1 mg/dL — ABNORMAL HIGH (ref 2.5–4.6)
Potassium: 3.9 mmol/L (ref 3.5–5.1)
Sodium: 134 mmol/L — ABNORMAL LOW (ref 135–145)

## 2017-12-04 LAB — CBC
HCT: 33.3 % — ABNORMAL LOW (ref 39.0–52.0)
HEMOGLOBIN: 11.3 g/dL — AB (ref 13.0–17.0)
MCH: 32.9 pg (ref 26.0–34.0)
MCHC: 33.9 g/dL (ref 30.0–36.0)
MCV: 97.1 fL (ref 78.0–100.0)
Platelets: 213 10*3/uL (ref 150–400)
RBC: 3.43 MIL/uL — AB (ref 4.22–5.81)
RDW: 15.4 % (ref 11.5–15.5)
WBC: 6.6 10*3/uL (ref 4.0–10.5)

## 2017-12-04 MED ORDER — SODIUM CHLORIDE 0.9 % IV SOLN: 100.0000 mL | INTRAVENOUS | Status: DC | PRN

## 2017-12-04 MED ORDER — METOPROLOL TARTRATE 25 MG PO TABS: ORAL_TABLET | ORAL | 1 refills | Status: DC

## 2017-12-04 MED ORDER — PENTAFLUOROPROP-TETRAFLUOROETH EX AERO: 1.0000 "application " | INHALATION_SPRAY | CUTANEOUS | Status: DC | PRN

## 2017-12-04 MED ORDER — LIDOCAINE-PRILOCAINE 2.5-2.5 % EX CREA: 1.0000 "application " | TOPICAL_CREAM | CUTANEOUS | Status: DC | PRN

## 2017-12-04 MED ORDER — ALTEPLASE 2 MG IJ SOLR: 2.0000 mg | Freq: Once | INTRAMUSCULAR | Status: DC | PRN

## 2017-12-04 MED ORDER — LIDOCAINE HCL (PF) 1 % IJ SOLN: 5.0000 mL | INTRAMUSCULAR | Status: DC | PRN

## 2017-12-04 MED ORDER — DICLOFENAC SODIUM 1 % TD GEL: 2.0000 g | Freq: Four times a day (QID) | TRANSDERMAL | 0 refills | Status: DC

## 2017-12-04 MED ORDER — HEPARIN SODIUM (PORCINE) 1000 UNIT/ML DIALYSIS: 1000.0000 [IU] | INTRAMUSCULAR | Status: DC | PRN

## 2017-12-04 MED ORDER — HEPARIN SODIUM (PORCINE) 1000 UNIT/ML DIALYSIS
2400.0000 [IU] | Freq: Once | INTRAMUSCULAR | Status: AC
  Administered 2017-12-04: 2400 [IU] via INTRAVENOUS_CENTRAL

## 2017-12-04 NOTE — Progress Notes (Signed)
Patient transferred to HD department.  Macguire Holsinger, RN

## 2017-12-04 NOTE — Progress Notes (Signed)
HD tx initiated via 15Gx2 w/o problem, pull/push/flush equally w/o problem, VSS, will cont to monitor while on HD tx 

## 2017-12-04 NOTE — Progress Notes (Signed)
   Subjective: No acute events overnight, pt has converted to normal sinus rhythm with normal rate. He reports his cough and the associated chest pain has improved. Notes intermittent back pain described as spasm which lasts a few minutes and occurs occasionally, more often at night.   Objective:  Vital signs in last 24 hours: Vitals:   12/04/17 0330 12/04/17 0400 12/04/17 0430 12/04/17 0523  BP: (!) 172/107 113/66 (!) 147/118 (!) 158/97  Pulse: (!) 51 63 75 77  Resp: 15 19 16 20   Temp:    97.9 F (36.6 C)  TempSrc:    Oral  SpO2: 96% 95% 96% 96%  Weight:    193 lb 9 oz (87.8 kg)  Height:       General: Sitting at edge of bed comfortably , no acute distress HEENT: Moist mucus membranes CV: RRR Resp: Clear breath sounds bilaterally, normal work of breathing, no distress  Abd: Soft, +BS, non-tender  Extr: No LE edema Neuro: Alert and oriented x3  Skin: Warm, dry     Assessment/Plan:  Hypertensive Urgency, Volume Overload H/o Heart Failure  Pt presented with acutely elevated BP >324 systolic with signs of volume overload despite no missed HD session. Following HD, his BP has improved, last 110/67. Repeat echo this admission showed EF 65-70%, LVH with grade 1 DD, LA severely dilated. He is only on amlodipine 10 mg at home for BP and has a history of hypotension, syncope presentations with his HD sessions, he was previously on Coreg. His BP seems to have large variations making control difficult. As described below, Metoprolol to be added and pt instructed to hold on HD days to avoid hypotension. Appreciate care management assistance in establishing pt with a PCP.  --Monitor vital signs  --Cont home Amlodipine --Cont Metoprolol  --Volume management with HD   Atrial Fibrillation with RVR, H/o nonsustained VT and a flutter Pt went in to a fib with RVR, asymptomatic with underlying LA dilation on echo. On chart review, he has had non-sustained VT in the past, as well as atrial flutter.  Coreg was intended to help control these issues, but was limited by hypotension with HD. He received a dose of IV Lopressor and has converted to normal sinus rhythm. Cardiology was consulted, appreciate their recommendations. A BB has been added to his medication regimen to aid in arrhythmia and BP control, though he is not a candidate for other anti-arrhythmics or anti-coagulation due to adherence issues in the past.  --Tele --Metoprolol 12.5 mg BID, hold on mornings of HD dys   ESRD on TThSa HD Pt had not missed any HD sessions but presented volume overloaded. He tolerated inpatient HD session after admission with 5 L removed and improvement in his clinical status and BP. Appreciate Nephrology assistance.    Dispo: Anticipated discharge today  Tawny Asal, MD 12/04/2017, 6:39 AM Pager: [phone]

## 2017-12-04 NOTE — Progress Notes (Signed)
DAILY PROGRESS NOTE   Patient Name: Kenneth [name] Date of Encounter: 12/04/2017  Chief Complaint   No complaints - had dialysis yesterday  Patient Profile    Kenneth [name] is a 62 y.o. male with a hx of NSVT, cardiomyopathy EF 25-30% 2017, ESRD on HD TThS, HTN, ETOH who is being seen today for the evaluation of atrial fibrillation at the request of Dr. Beryle Beams.  Subjective   Now in sinus in the 80's with occasional PVC's. Given metoprolol this am - BP tolerating.  Objective   Vitals:   12/04/17 0400 12/04/17 0430 12/04/17 0523 12/04/17 0849  BP: 113/66 (!) 147/118 (!) 158/97 139/73  Pulse: 63 75 77 84  Resp: _0 Temp:   97.9 F (36.6 C) 98.8 F (37.1 C)  TempSrc:   Oral Oral  SpO2: 95% 96% 96% 96%  Weight:   193 lb 9 oz (87.8 kg)   Height:        Intake/Output Summary (Last 24 hours) at 12/04/2017 0768 Last data filed at 12/04/2017 0881 Gross per 24 hour  Intake 360 ml  Output 1266 ml  Net -906 ml   Filed Weights   12/03/17 0514 12/04/17 0115 12/04/17 0523  Weight: 194 lb 6.4 oz (88.2 kg) 195 lb 12.3 oz (88.8 kg) 193 lb 9 oz (87.8 kg)    Physical Exam   General appearance: alert and no distress Lungs: clear to auscultation bilaterally Heart: regular rate and rhythm Extremities: extremities normal, atraumatic, no cyanosis or edema Neurologic: Grossly normal  Inpatient Medications    Scheduled Meds: . amLODipine  10 mg Oral QHS  . [START ON 12/05/2017] calcitRIOL  2.5 mcg Oral Q T,Th,Sa-HD  . calcium acetate  1,334 mg Oral TID WC  . cinacalcet  90 mg Oral Q T,Th,Sat-1800  . heparin  5,000 Units Subcutaneous Q8H  . metoprolol tartrate  12.5 mg Oral BID  . multivitamin  1 tablet Oral QHS    Continuous Infusions:   PRN Meds: acetaminophen, guaiFENesin-dextromethorphan, ipratropium-albuterol, labetalol, nitroGLYCERIN   Labs   Results for orders placed or performed during the hospital encounter of 12/01/17 (from the past 48 hour(s))  TSH      Status: None   Collection Time: 12/03/17  5:18 PM  Result Value Ref Range   TSH 0.825 0.350 - 4.500 uIU/mL    Comment: Performed by a 3rd Generation assay with a functional sensitivity of <=0.01 uIU/mL. Performed at [facility], [place] [address], [place], [place] [address]   CBC     Status: Abnormal   Collection Time: 12/04/17  1:47 AM  Result Value Ref Range   WBC 6.6 4.0 - 10.5 K/uL   RBC 3.43 (L) 4.22 - 5.81 MIL/uL   Hemoglobin 11.3 (L) 13.0 - 17.0 g/dL   HCT 33.3 (L) 39.0 - 52.0 %   MCV 97.1 78.0 - 100.0 fL   MCH 32.9 26.0 - 34.0 pg   MCHC 33.9 30.0 - 36.0 g/dL   RDW 15.4 11.5 - 15.5 %   Platelets 213 150 - 400 K/uL    Comment: Performed at [facility], [place] [address], [place], [place] [address]  Renal function panel     Status: Abnormal   Collection Time: 12/04/17  1:48 AM  Result Value Ref Range   Sodium 134 (L) 135 - 145 mmol/L   Potassium 3.9 3.5 - 5.1 mmol/L   Chloride 93 (L) 101 - 111 mmol/L   CO2 24 22 -  32 mmol/L   Glucose, Bld 89 65 - 99 mg/dL   BUN 61 (H) 6 - 20 mg/dL   Creatinine, Ser 12.54 (H) 0.61 - 1.24 mg/dL    Comment: DELTA CHECK NOTED   Calcium 9.5 8.9 - 10.3 mg/dL   Phosphorus 6.1 (H) 2.5 - 4.6 mg/dL   Albumin 3.1 (L) 3.5 - 5.0 g/dL   GFR calc non Af Amer 4 (L) >60 mL/min   GFR calc Af Amer 4 (L) >60 mL/min    Comment: (NOTE) The eGFR has been calculated using the CKD EPI equation. This calculation has not been validated in all clinical situations. eGFR's persistently <60 mL/min signify possible Chronic Kidney Disease.    Anion gap 17 (H) 5 - 15    Comment: Performed at [facility], [place] [address] [place], [place] [address]    ECG   N/A  Telemetry   Sinus with PVC's - Personally Reviewed  Radiology    No results found.  Cardiac Studies   N/A  Assessment   1. Principal Problem: 2.   Hypertensive urgency 3. Active Problems: 4.   ESRD on dialysis ([place]) 5.   Acute combined systolic and diastolic heart failure  ([place]) 6.   Atrial fibrillation and flutter ([place]) 7.   Shortness of breath 8.   Plan   1. Continue metoprolol for rate-control. Not an anticoagulation candidate due to compliance issues and concern for follow-up INR checks if he were on warfarin.   No further suggestions. Cardiology will sign-off. Call with questions.  Time Spent Directly with Patient:  I have spent a total of 15 minutes with the patient reviewing hospital notes, telemetry, EKGs, labs and examining the patient as well as establishing an assessment and plan that was discussed personally with the patient. > 50% of time was spent in direct patient care.  Length of Stay:  LOS: 3 days   Pixie Casino, MD, [facility], [place] Director of the Advanced Lipid Disorders &  Cardiovascular Risk Reduction Clinic Diplomate of the American Board of Clinical Lipidology Attending Cardiologist  Direct Dial: [phone]  Fax: [phone]  Website:  [email or web address] 12/04/2017, 9:38 AM

## 2017-12-04 NOTE — Progress Notes (Signed)
HD tx ended 1 hr early d/t it being bleach day and was in the process of switching pt over to the RO machine and he decided he wanted to come off early rather that switch over, Dr. Justin Mend made aware and ok, UF goal not met, blood rinsed back, VSS, report called to [employer], RN

## 2017-12-04 NOTE — Progress Notes (Signed)
[facility] Progress Note  Subjective: no new c/o, says he is going home  Vitals:   12/04/17 0400 12/04/17 0430 12/04/17 0523 12/04/17 0849  BP: 113/66 (!) 147/118 (!) 158/97 139/73  Pulse: 63 75 77 84  Resp: 19 16 20    Temp:   97.9 F (36.6 C) 98.8 F (37.1 C)  TempSrc:   Oral Oral  SpO2: 95% 96% 96% 96%  Weight:   87.8 kg (193 lb 9 oz)   Height:        Inpatient medications: . amLODipine  10 mg Oral QHS  . [START ON 12/05/2017] calcitRIOL  2.5 mcg Oral Q T,Th,Sa-HD  . calcium acetate  1,334 mg Oral TID WC  . cinacalcet  90 mg Oral Q T,Th,Sat-1800  . heparin  5,000 Units Subcutaneous Q8H  . metoprolol tartrate  12.5 mg Oral BID  . multivitamin  1 tablet Oral QHS    acetaminophen, guaiFENesin-dextromethorphan, ipratropium-albuterol, labetalol, nitroGLYCERIN  Exam: General: WDWN NAD Head: NCAT sclera not icteric MMM Neck: Supple. No JVD No masses Lungs: clear bilat Heart: RRR no mrg Abdomen: soft NT + BS Lower extremities:without edema or ischemic changes, no open wounds  Neuro: A & O  X 3. Moves all extremities spontaneously. Psych:  Responds to questions appropriately with a normal affect. Dialysis Access: LUE AVF +bruit/thrill     Dialysis:  [facility] TTS 4.25h 180F BFR 400/800 EDW 87kg 2K/2Ca Hep 2400 L AVF Venofer 50mg  IV q week Calcitriol 2.2mcg PO TIW Sensipar 90mg  PO TIW        Impression: 1  HTN urgency - imprvoed 2  SOB/ pulm edema/+8kg  / vol overload - now at dry wt, resolved 3  Afib/ RVR - cards consulted 4  ESRD HD TTS 5  Anemia ckd, stable no esa 6  CM/ CHF as above 7  Dispo - for dc today  Plan - as above  Kelly Splinter MD [facility] pager [phone]   12/04/2017, 11:50 AM   Recent Labs  Lab 12/01/17 0840 12/02/17 0536 12/04/17 0148  NA 142 135 134*  K 4.1 3.6 3.9  CL 106 94* 93*  CO2 18* 23 24  GLUCOSE 96 114* 89  BUN 51* 21* 61*  CREATININE 13.35* 6.53* 12.54*  CALCIUM 9.1 8.4* 9.5  PHOS  --   --   6.1*   Recent Labs  Lab 12/04/17 0148  ALBUMIN 3.1*   Recent Labs  Lab 12/01/17 0840 12/04/17 0147  WBC 6.1 6.6  HGB 10.8* 11.3*  HCT 32.2* 33.3*  MCV 99.7 97.1  PLT 192 213   Iron/TIBC/Ferritin/ %Sat    Component Value Date/Time   IRON 51 05/22/2016 0414   TIBC 262 05/22/2016 0414   FERRITIN 113 05/22/2016 0414   IRONPCTSAT 19 05/22/2016 0414

## 2017-12-04 NOTE — Progress Notes (Signed)
Medicine attending discharge note: I personally examined this patient on the day of discharge together with resident physician Dr. Tawny Asal and I attest to the accuracy of the discharge evaluation and plan which will be subsequently detailed in his discharge summary and which we discussed together.  62 year old man with end-stage renal disease on dialysis, history of supraventricular tachycardia presenting with syncope in December 2017, chronic combined systolic and diastolic heart failure, ejection fraction in the 25-35% range with grade 1-2 diastolic dysfunction on a July 2017 study with subsequent improvement on a December 2017 study.  History of alcohol use disorder.  He presented on the day of the current admission with increasing dyspnea, cough, and chest pressure.  Systolic blood pressure elevated as high as 218 mm, positive jugular venous distention, rales, and peripheral edema.  Initial EKG with sinus rhythm and no acute ischemic change.  Borderline elevation of troponin likely related to his chronic renal disease.  Chest x-ray with cardiomegaly and borderline pulmonary vascular congestion. He was treated with antihypertensives and ongoing dialysis.  He tested negative for influenza.  He has a history of labile blood pressure with hypotension occurring typically on dialysis days and hypertension on nondialysis days. While medications being adjusted, he went back into atrial fibrillation.  He also was noted to have short runs of nonsustained ventricular tachycardia.  He had no regular primary care or cardiology follow-up prior to this admission.  He was seen in consultation by nephrology and cardiology. He reverted back into sinus rhythm with medical therapy. Follow-up echocardiogram this admission done on December 02, 2017 with results discrepant from some previous echoes now showing hyperdynamic left ventricular function with EF 65-70%, normal wall motion, and grade 1 diastolic dysfunction.   Severely dilated left atrium.  This study correlated well with a December 2017 study but not a July 2017 study. Cardiology consultant recommended holding his beta-blocker on dialysis days.  Medications at discharge will be amlodipine 10 mg daily and metoprolol 12.5 mg twice daily except hold on dialysis days.  Disposition: Condition stable at time of discharge Primary care follow-up arranged through the [facility] practice.  He will also follow-up with cardiology.  We will attempt to forward discharge summary to his new primary care. There were no complications

## 2017-12-04 NOTE — Discharge Summary (Signed)
Name: Kenneth [name] MRN: [id number] DOB: 28-Mar-1956 62 y.o. PCP: Patient, No Pcp Per  Date of Admission: 12/01/2017  8:17 AM Date of Discharge: 12/04/2017  Attending Physician: Annia Belt, MD  Discharge Diagnosis: Principal Problem:   Hypertensive urgency Active Problems:   ESRD on dialysis [facility])   Ventricular tachycardia, non-sustained (HCC)   Acute combined systolic and diastolic heart failure (HCC)   Atrial fibrillation and flutter (HCC)   Shortness of breath   Discharge Medications: Allergies as of 12/04/2017   No Known Allergies     Medication List    TAKE these medications   acetaminophen 325 MG tablet Commonly known as:  TYLENOL Take 2 tablets (650 mg total) by mouth every 6 (six) hours as needed for mild pain (or Fever >/= 101).   amLODipine 10 MG tablet Commonly known as:  NORVASC Take 10 mg by mouth at bedtime.   diclofenac sodium 1 % Gel Commonly known as:  VOLTAREN Apply 2 g topically 4 (four) times daily. For back pain and spasm   diphenhydramine-acetaminophen 25-500 MG Tabs tablet Commonly known as:  TYLENOL PM Take 1 tablet by mouth at bedtime as needed (for sleep).   metoprolol tartrate 25 MG tablet Commonly known as:  LOPRESSOR Take a half tablet (12.5 mg) twice a day. Do not take on mornings of dialysis   PHOSLO 667 MG capsule Generic drug:  calcium acetate Take 1,334-2,001 mg by mouth See admin instructions. 2,001 mg three times a day with meals and 1,334 mg two times a day with snacks       Disposition and follow-up:   Kenneth [name] was discharged from [facility] in Stable condition.  At the hospital follow up visit please address:  1.  --Assess BP control and ensure no extreme values of either hyper or hypotension, no pre-syncopal sx  --Assess for sx of palpitations though pt was asymptomatic with a fib in hospital  --Assess volume status and that he is at dry weight (~193 lbs)   2.  Labs / imaging needed at  time of follow-up: None  3.  Pending labs/ test needing follow-up: None   Follow-up Appointments: Follow-up Information    [name], MD Follow up on 12/22/2017.   Specialty:  Family Medicine Why:  at 2 pm; please try to keep your apt or call to reschedule Contact information: [place] [address] [phone]           Hospital Course by problem list:   Hypertensive Urgency with Pulmonary Edema, Volume Overload H/o Heart Failure He presented with acutely elevated BP with systolics >841 with signs of volume overload, pulmonary edema on CXR despite no missed HD sessions. With an HD session (5 L removed initially) his BP improved and went down to 110/67. He has a history of HF (EF reduced to 25-30% in 2017 with subsequent improvement). Repeat echo this admission showed EF 65-70% with grade 1 diastolic dysfunction and severe LA dilation. He has a history of hypotension and syncopal presentations when previously on coreg. The large variations of his BP make control difficult and he was on home amlodipine 10 mg as an outpatient. Prior to discharge, metoprolol 12.5 mg BID was added to his regimen which his BP tolerated well. This will be held on mornings of HD days. He was also established with a primary care doctor for follow up and further titration of his medications. BP on discharge 139/73.   Atrial Fibrillation and  Flutter The pt has a history of atrial flutter and non-sustained VT per chart review, has LA dilation on echo as a predisposing factor. On the morning of hospital day 2, he was noted to be tachycardic to 140s, EKG with atrial fibrillation with RVR. He received a dose of IV Metoprolol with improvement in his rate and later spontaneously converted to NSR. Cardiology was consulted for recommendations for a fib given hx and difficult BP. He was not a candidate for anti-arrhythmics (other than BB) or anti-coagulation due to a history of adherence issues.  Metoprolol 12/5 mg BID with skipped doses on mornings of HD was recommended to help control any recurrent arrhythmias and his blood pressure.    ESRD on TThS HD Pt received regularly scheduled HD while inpatient with improvement in his volume status and blood pressure. He receives several renal medications on dialysis days which were not "prescribed" within med rec list.   Back Pain Pt noted intermittent back pain described as a spasm, brief duration and occasional occurrence, most often at night. Given short and infrequent nature of sx, as well as ESRD, did not favor tx with anti-spasmodics and prescribed voltaren gel and advised to use tylenol for trial of conservative management. No concerning features on exam.    Discharge Vitals:   BP 139/73 (BP Location: Right Arm)   Pulse 84   Temp 98.8 F (37.1 C) (Oral)   Resp 20   Ht 5\' 10"  (1.778 m)   Wt 193 lb 9 oz (87.8 kg)   SpO2 96%   BMI 27.77 kg/m   Pertinent Labs, Studies, and Procedures:   Echo:  - Left ventricle: The cavity size was normal. Wall thickness was   increased in a pattern of moderate LVH. Systolic function was   vigorous. The estimated ejection fraction was in the range of 65%   to 70%. Wall motion was normal; there were no regional wall   motion abnormalities. Doppler parameters are consistent with   abnormal left ventricular relaxation (grade 1 diastolic   dysfunction). - Aortic valve: Valve area (VTI): 2.59 cm^2. Valve area (Vmax):   2.83 cm^2. Valve area (Vmean): 2.64 cm^2. - Mitral valve: There was mild to moderate regurgitation. - Left atrium: The atrium was severely dilated. - Right atrium: The atrium was mildly dilated.  Discharge Instructions: Discharge Instructions    Discharge instructions   Complete by:  As directed    Nice to meet you Kenneth [name] -For your blood pressure, continue to take your Amlodipine 10 mg each day.  -You will also take a new medicine called Metoprolol--take a half tablet twice  a day but do NOT take the morning dose on days that you have dialysis -You have a follow up appointment with a primary care doctor scheduled with Dr Grier Mitts with [facility] for Primary Care for Dec 22, 2017 at 2 pm -We also prescribed a gel that you can apply to your back when you are feeling pain or muscle spasm   Increase activity slowly   Complete by:  As directed       Signed: Tawny Asal, MD 12/04/2017, 10:35 AM   Pager: [phone]

## 2017-12-04 NOTE — Progress Notes (Signed)
Discharge instructions (including medications) discussed with and copy provided to patient/caregiver. Called patient's daughter, but she is unable to pick up patient. Patient states car is broken down. Will contact social work for cab voucher or arrange discharge transportation.

## 2017-12-05 DIAGNOSIS — D631 Anemia in chronic kidney disease: Secondary | ICD-10-CM | POA: Diagnosis not present

## 2017-12-05 DIAGNOSIS — N186 End stage renal disease: Secondary | ICD-10-CM | POA: Diagnosis not present

## 2017-12-05 DIAGNOSIS — D509 Iron deficiency anemia, unspecified: Secondary | ICD-10-CM | POA: Diagnosis not present

## 2017-12-05 DIAGNOSIS — N2581 Secondary hyperparathyroidism of renal origin: Secondary | ICD-10-CM | POA: Diagnosis not present

## 2017-12-08 DIAGNOSIS — N2581 Secondary hyperparathyroidism of renal origin: Secondary | ICD-10-CM | POA: Diagnosis not present

## 2017-12-08 DIAGNOSIS — D631 Anemia in chronic kidney disease: Secondary | ICD-10-CM | POA: Diagnosis not present

## 2017-12-08 DIAGNOSIS — D509 Iron deficiency anemia, unspecified: Secondary | ICD-10-CM | POA: Diagnosis not present

## 2017-12-08 DIAGNOSIS — N186 End stage renal disease: Secondary | ICD-10-CM | POA: Diagnosis not present

## 2017-12-10 DIAGNOSIS — D509 Iron deficiency anemia, unspecified: Secondary | ICD-10-CM | POA: Diagnosis not present

## 2017-12-10 DIAGNOSIS — N186 End stage renal disease: Secondary | ICD-10-CM | POA: Diagnosis not present

## 2017-12-10 DIAGNOSIS — D631 Anemia in chronic kidney disease: Secondary | ICD-10-CM | POA: Diagnosis not present

## 2017-12-10 DIAGNOSIS — N2581 Secondary hyperparathyroidism of renal origin: Secondary | ICD-10-CM | POA: Diagnosis not present

## 2017-12-12 DIAGNOSIS — D509 Iron deficiency anemia, unspecified: Secondary | ICD-10-CM | POA: Diagnosis not present

## 2017-12-12 DIAGNOSIS — D631 Anemia in chronic kidney disease: Secondary | ICD-10-CM | POA: Diagnosis not present

## 2017-12-12 DIAGNOSIS — N2581 Secondary hyperparathyroidism of renal origin: Secondary | ICD-10-CM | POA: Diagnosis not present

## 2017-12-12 DIAGNOSIS — N186 End stage renal disease: Secondary | ICD-10-CM | POA: Diagnosis not present

## 2017-12-15 DIAGNOSIS — D631 Anemia in chronic kidney disease: Secondary | ICD-10-CM | POA: Diagnosis not present

## 2017-12-15 DIAGNOSIS — D509 Iron deficiency anemia, unspecified: Secondary | ICD-10-CM | POA: Diagnosis not present

## 2017-12-15 DIAGNOSIS — N186 End stage renal disease: Secondary | ICD-10-CM | POA: Diagnosis not present

## 2017-12-15 DIAGNOSIS — N2581 Secondary hyperparathyroidism of renal origin: Secondary | ICD-10-CM | POA: Diagnosis not present

## 2017-12-17 DIAGNOSIS — D631 Anemia in chronic kidney disease: Secondary | ICD-10-CM | POA: Diagnosis not present

## 2017-12-17 DIAGNOSIS — N186 End stage renal disease: Secondary | ICD-10-CM | POA: Diagnosis not present

## 2017-12-17 DIAGNOSIS — D509 Iron deficiency anemia, unspecified: Secondary | ICD-10-CM | POA: Diagnosis not present

## 2017-12-17 DIAGNOSIS — N2581 Secondary hyperparathyroidism of renal origin: Secondary | ICD-10-CM | POA: Diagnosis not present

## 2017-12-19 DIAGNOSIS — N186 End stage renal disease: Secondary | ICD-10-CM | POA: Diagnosis not present

## 2017-12-19 DIAGNOSIS — D631 Anemia in chronic kidney disease: Secondary | ICD-10-CM | POA: Diagnosis not present

## 2017-12-19 DIAGNOSIS — D509 Iron deficiency anemia, unspecified: Secondary | ICD-10-CM | POA: Diagnosis not present

## 2017-12-19 DIAGNOSIS — N2581 Secondary hyperparathyroidism of renal origin: Secondary | ICD-10-CM | POA: Diagnosis not present

## 2017-12-22 ENCOUNTER — Ambulatory Visit: Payer: Medicare Other | Admitting: Family Medicine

## 2017-12-22 DIAGNOSIS — N186 End stage renal disease: Secondary | ICD-10-CM | POA: Diagnosis not present

## 2017-12-22 DIAGNOSIS — N2581 Secondary hyperparathyroidism of renal origin: Secondary | ICD-10-CM | POA: Diagnosis not present

## 2017-12-22 DIAGNOSIS — Z0289 Encounter for other administrative examinations: Secondary | ICD-10-CM

## 2017-12-22 DIAGNOSIS — D509 Iron deficiency anemia, unspecified: Secondary | ICD-10-CM | POA: Diagnosis not present

## 2017-12-22 DIAGNOSIS — D631 Anemia in chronic kidney disease: Secondary | ICD-10-CM | POA: Diagnosis not present

## 2017-12-24 DIAGNOSIS — N186 End stage renal disease: Secondary | ICD-10-CM | POA: Diagnosis not present

## 2017-12-24 DIAGNOSIS — D509 Iron deficiency anemia, unspecified: Secondary | ICD-10-CM | POA: Diagnosis not present

## 2017-12-24 DIAGNOSIS — N2581 Secondary hyperparathyroidism of renal origin: Secondary | ICD-10-CM | POA: Diagnosis not present

## 2017-12-24 DIAGNOSIS — D631 Anemia in chronic kidney disease: Secondary | ICD-10-CM | POA: Diagnosis not present

## 2017-12-25 DIAGNOSIS — N186 End stage renal disease: Secondary | ICD-10-CM | POA: Diagnosis not present

## 2017-12-25 DIAGNOSIS — I129 Hypertensive chronic kidney disease with stage 1 through stage 4 chronic kidney disease, or unspecified chronic kidney disease: Secondary | ICD-10-CM | POA: Diagnosis not present

## 2017-12-25 DIAGNOSIS — Z992 Dependence on renal dialysis: Secondary | ICD-10-CM | POA: Diagnosis not present

## 2017-12-26 DIAGNOSIS — N186 End stage renal disease: Secondary | ICD-10-CM | POA: Diagnosis not present

## 2017-12-26 DIAGNOSIS — D509 Iron deficiency anemia, unspecified: Secondary | ICD-10-CM | POA: Diagnosis not present

## 2017-12-26 DIAGNOSIS — N2581 Secondary hyperparathyroidism of renal origin: Secondary | ICD-10-CM | POA: Diagnosis not present

## 2017-12-26 DIAGNOSIS — D631 Anemia in chronic kidney disease: Secondary | ICD-10-CM | POA: Diagnosis not present

## 2017-12-29 DIAGNOSIS — D509 Iron deficiency anemia, unspecified: Secondary | ICD-10-CM | POA: Diagnosis not present

## 2017-12-29 DIAGNOSIS — D631 Anemia in chronic kidney disease: Secondary | ICD-10-CM | POA: Diagnosis not present

## 2017-12-29 DIAGNOSIS — N186 End stage renal disease: Secondary | ICD-10-CM | POA: Diagnosis not present

## 2017-12-29 DIAGNOSIS — N2581 Secondary hyperparathyroidism of renal origin: Secondary | ICD-10-CM | POA: Diagnosis not present

## 2017-12-31 DIAGNOSIS — D509 Iron deficiency anemia, unspecified: Secondary | ICD-10-CM | POA: Diagnosis not present

## 2017-12-31 DIAGNOSIS — D631 Anemia in chronic kidney disease: Secondary | ICD-10-CM | POA: Diagnosis not present

## 2017-12-31 DIAGNOSIS — N2581 Secondary hyperparathyroidism of renal origin: Secondary | ICD-10-CM | POA: Diagnosis not present

## 2017-12-31 DIAGNOSIS — N186 End stage renal disease: Secondary | ICD-10-CM | POA: Diagnosis not present

## 2018-01-02 DIAGNOSIS — N2581 Secondary hyperparathyroidism of renal origin: Secondary | ICD-10-CM | POA: Diagnosis not present

## 2018-01-02 DIAGNOSIS — N186 End stage renal disease: Secondary | ICD-10-CM | POA: Diagnosis not present

## 2018-01-02 DIAGNOSIS — D631 Anemia in chronic kidney disease: Secondary | ICD-10-CM | POA: Diagnosis not present

## 2018-01-02 DIAGNOSIS — D509 Iron deficiency anemia, unspecified: Secondary | ICD-10-CM | POA: Diagnosis not present

## 2018-01-05 DIAGNOSIS — D509 Iron deficiency anemia, unspecified: Secondary | ICD-10-CM | POA: Diagnosis not present

## 2018-01-05 DIAGNOSIS — N2581 Secondary hyperparathyroidism of renal origin: Secondary | ICD-10-CM | POA: Diagnosis not present

## 2018-01-05 DIAGNOSIS — D631 Anemia in chronic kidney disease: Secondary | ICD-10-CM | POA: Diagnosis not present

## 2018-01-05 DIAGNOSIS — N186 End stage renal disease: Secondary | ICD-10-CM | POA: Diagnosis not present

## 2018-01-07 DIAGNOSIS — D631 Anemia in chronic kidney disease: Secondary | ICD-10-CM | POA: Diagnosis not present

## 2018-01-07 DIAGNOSIS — N2581 Secondary hyperparathyroidism of renal origin: Secondary | ICD-10-CM | POA: Diagnosis not present

## 2018-01-07 DIAGNOSIS — D509 Iron deficiency anemia, unspecified: Secondary | ICD-10-CM | POA: Diagnosis not present

## 2018-01-07 DIAGNOSIS — N186 End stage renal disease: Secondary | ICD-10-CM | POA: Diagnosis not present

## 2018-01-09 DIAGNOSIS — N2581 Secondary hyperparathyroidism of renal origin: Secondary | ICD-10-CM | POA: Diagnosis not present

## 2018-01-09 DIAGNOSIS — D509 Iron deficiency anemia, unspecified: Secondary | ICD-10-CM | POA: Diagnosis not present

## 2018-01-09 DIAGNOSIS — D631 Anemia in chronic kidney disease: Secondary | ICD-10-CM | POA: Diagnosis not present

## 2018-01-09 DIAGNOSIS — N186 End stage renal disease: Secondary | ICD-10-CM | POA: Diagnosis not present

## 2018-01-12 DIAGNOSIS — N2581 Secondary hyperparathyroidism of renal origin: Secondary | ICD-10-CM | POA: Diagnosis not present

## 2018-01-12 DIAGNOSIS — N186 End stage renal disease: Secondary | ICD-10-CM | POA: Diagnosis not present

## 2018-01-12 DIAGNOSIS — D631 Anemia in chronic kidney disease: Secondary | ICD-10-CM | POA: Diagnosis not present

## 2018-01-12 DIAGNOSIS — D509 Iron deficiency anemia, unspecified: Secondary | ICD-10-CM | POA: Diagnosis not present

## 2018-01-14 DIAGNOSIS — N2581 Secondary hyperparathyroidism of renal origin: Secondary | ICD-10-CM | POA: Diagnosis not present

## 2018-01-14 DIAGNOSIS — D631 Anemia in chronic kidney disease: Secondary | ICD-10-CM | POA: Diagnosis not present

## 2018-01-14 DIAGNOSIS — N186 End stage renal disease: Secondary | ICD-10-CM | POA: Diagnosis not present

## 2018-01-14 DIAGNOSIS — D509 Iron deficiency anemia, unspecified: Secondary | ICD-10-CM | POA: Diagnosis not present

## 2018-01-16 DIAGNOSIS — D509 Iron deficiency anemia, unspecified: Secondary | ICD-10-CM | POA: Diagnosis not present

## 2018-01-16 DIAGNOSIS — D631 Anemia in chronic kidney disease: Secondary | ICD-10-CM | POA: Diagnosis not present

## 2018-01-16 DIAGNOSIS — N186 End stage renal disease: Secondary | ICD-10-CM | POA: Diagnosis not present

## 2018-01-16 DIAGNOSIS — N2581 Secondary hyperparathyroidism of renal origin: Secondary | ICD-10-CM | POA: Diagnosis not present

## 2018-01-19 DIAGNOSIS — N2581 Secondary hyperparathyroidism of renal origin: Secondary | ICD-10-CM | POA: Diagnosis not present

## 2018-01-19 DIAGNOSIS — D631 Anemia in chronic kidney disease: Secondary | ICD-10-CM | POA: Diagnosis not present

## 2018-01-19 DIAGNOSIS — D509 Iron deficiency anemia, unspecified: Secondary | ICD-10-CM | POA: Diagnosis not present

## 2018-01-19 DIAGNOSIS — N186 End stage renal disease: Secondary | ICD-10-CM | POA: Diagnosis not present

## 2018-01-21 DIAGNOSIS — N2581 Secondary hyperparathyroidism of renal origin: Secondary | ICD-10-CM | POA: Diagnosis not present

## 2018-01-21 DIAGNOSIS — D631 Anemia in chronic kidney disease: Secondary | ICD-10-CM | POA: Diagnosis not present

## 2018-01-21 DIAGNOSIS — D509 Iron deficiency anemia, unspecified: Secondary | ICD-10-CM | POA: Diagnosis not present

## 2018-01-21 DIAGNOSIS — N186 End stage renal disease: Secondary | ICD-10-CM | POA: Diagnosis not present

## 2018-01-23 DIAGNOSIS — N2581 Secondary hyperparathyroidism of renal origin: Secondary | ICD-10-CM | POA: Diagnosis not present

## 2018-01-23 DIAGNOSIS — D509 Iron deficiency anemia, unspecified: Secondary | ICD-10-CM | POA: Diagnosis not present

## 2018-01-23 DIAGNOSIS — D631 Anemia in chronic kidney disease: Secondary | ICD-10-CM | POA: Diagnosis not present

## 2018-01-23 DIAGNOSIS — N186 End stage renal disease: Secondary | ICD-10-CM | POA: Diagnosis not present

## 2018-01-25 DIAGNOSIS — R0602 Shortness of breath: Secondary | ICD-10-CM | POA: Diagnosis not present

## 2018-01-25 DIAGNOSIS — I509 Heart failure, unspecified: Secondary | ICD-10-CM

## 2018-01-25 DIAGNOSIS — N186 End stage renal disease: Secondary | ICD-10-CM | POA: Diagnosis not present

## 2018-01-25 DIAGNOSIS — Z992 Dependence on renal dialysis: Secondary | ICD-10-CM | POA: Diagnosis not present

## 2018-01-25 DIAGNOSIS — I129 Hypertensive chronic kidney disease with stage 1 through stage 4 chronic kidney disease, or unspecified chronic kidney disease: Secondary | ICD-10-CM | POA: Diagnosis not present

## 2018-01-25 HISTORY — DX: Heart failure, unspecified: I50.9

## 2018-01-26 ENCOUNTER — Emergency Department (HOSPITAL_COMMUNITY): Payer: Medicare Other

## 2018-01-26 ENCOUNTER — Encounter (HOSPITAL_COMMUNITY): Payer: Self-pay | Admitting: Emergency Medicine

## 2018-01-26 ENCOUNTER — Inpatient Hospital Stay (HOSPITAL_COMMUNITY)
Admission: EM | Admit: 2018-01-26 | Discharge: 2018-01-27 | DRG: 291 | Disposition: A | Discharge status: Home / Self Care | Payer: Medicare Other | Attending: Internal Medicine | Admitting: Internal Medicine

## 2018-01-26 DIAGNOSIS — I429 Cardiomyopathy, unspecified: Secondary | ICD-10-CM | POA: Diagnosis present

## 2018-01-26 DIAGNOSIS — N2581 Secondary hyperparathyroidism of renal origin: Secondary | ICD-10-CM | POA: Diagnosis not present

## 2018-01-26 DIAGNOSIS — I132 Hypertensive heart and chronic kidney disease with heart failure and with stage 5 chronic kidney disease, or end stage renal disease: Principal | ICD-10-CM | POA: Diagnosis present

## 2018-01-26 DIAGNOSIS — I4892 Unspecified atrial flutter: Secondary | ICD-10-CM

## 2018-01-26 DIAGNOSIS — Z79899 Other long term (current) drug therapy: Secondary | ICD-10-CM

## 2018-01-26 DIAGNOSIS — Z87891 Personal history of nicotine dependence: Secondary | ICD-10-CM

## 2018-01-26 DIAGNOSIS — N186 End stage renal disease: Secondary | ICD-10-CM | POA: Diagnosis not present

## 2018-01-26 DIAGNOSIS — J811 Chronic pulmonary edema: Secondary | ICD-10-CM | POA: Diagnosis present

## 2018-01-26 DIAGNOSIS — I4891 Unspecified atrial fibrillation: Secondary | ICD-10-CM | POA: Diagnosis present

## 2018-01-26 DIAGNOSIS — D631 Anemia in chronic kidney disease: Secondary | ICD-10-CM | POA: Diagnosis not present

## 2018-01-26 DIAGNOSIS — R062 Wheezing: Secondary | ICD-10-CM | POA: Diagnosis not present

## 2018-01-26 DIAGNOSIS — I161 Hypertensive emergency: Secondary | ICD-10-CM | POA: Diagnosis not present

## 2018-01-26 DIAGNOSIS — E8889 Other specified metabolic disorders: Secondary | ICD-10-CM | POA: Diagnosis present

## 2018-01-26 DIAGNOSIS — I5043 Acute on chronic combined systolic (congestive) and diastolic (congestive) heart failure: Secondary | ICD-10-CM | POA: Diagnosis present

## 2018-01-26 DIAGNOSIS — D649 Anemia, unspecified: Secondary | ICD-10-CM | POA: Diagnosis present

## 2018-01-26 DIAGNOSIS — I120 Hypertensive chronic kidney disease with stage 5 chronic kidney disease or end stage renal disease: Secondary | ICD-10-CM | POA: Diagnosis not present

## 2018-01-26 DIAGNOSIS — J810 Acute pulmonary edema: Secondary | ICD-10-CM | POA: Diagnosis not present

## 2018-01-26 DIAGNOSIS — R0602 Shortness of breath: Secondary | ICD-10-CM | POA: Diagnosis not present

## 2018-01-26 DIAGNOSIS — Z992 Dependence on renal dialysis: Secondary | ICD-10-CM | POA: Diagnosis not present

## 2018-01-26 DIAGNOSIS — I10 Essential (primary) hypertension: Secondary | ICD-10-CM | POA: Diagnosis not present

## 2018-01-26 DIAGNOSIS — J9601 Acute respiratory failure with hypoxia: Secondary | ICD-10-CM | POA: Diagnosis present

## 2018-01-26 DIAGNOSIS — F1010 Alcohol abuse, uncomplicated: Secondary | ICD-10-CM | POA: Diagnosis present

## 2018-01-26 DIAGNOSIS — F1020 Alcohol dependence, uncomplicated: Secondary | ICD-10-CM | POA: Diagnosis present

## 2018-01-26 DIAGNOSIS — I5033 Acute on chronic diastolic (congestive) heart failure: Secondary | ICD-10-CM | POA: Diagnosis not present

## 2018-01-26 HISTORY — DX: Heart failure, unspecified: I50.9

## 2018-01-26 LAB — I-STAT ARTERIAL BLOOD GAS, ED
ACID-BASE DEFICIT: 3 mmol/L — AB (ref 0.0–2.0)
BICARBONATE: 22.2 mmol/L (ref 20.0–28.0)
O2 Saturation: 99 %
PO2 ART: 159 mmHg — AB (ref 83.0–108.0)
Patient temperature: 98.6
TCO2: 23 mmol/L (ref 22–32)
pCO2 arterial: 41.2 mmHg (ref 32.0–48.0)
pH, Arterial: 7.34 — ABNORMAL LOW (ref 7.350–7.450)

## 2018-01-26 LAB — I-STAT CHEM 8, ED
BUN: 47 mg/dL — AB (ref 6–20)
CALCIUM ION: 1.15 mmol/L (ref 1.15–1.40)
CHLORIDE: 106 mmol/L (ref 101–111)
CREATININE: 12.5 mg/dL — AB (ref 0.61–1.24)
Glucose, Bld: 98 mg/dL (ref 65–99)
HCT: 35 % — ABNORMAL LOW (ref 39.0–52.0)
Hemoglobin: 11.9 g/dL — ABNORMAL LOW (ref 13.0–17.0)
Potassium: 4.6 mmol/L (ref 3.5–5.1)
SODIUM: 142 mmol/L (ref 135–145)
TCO2: 27 mmol/L (ref 22–32)

## 2018-01-26 LAB — BASIC METABOLIC PANEL
ANION GAP: 16 — AB (ref 5–15)
BUN: 44 mg/dL — ABNORMAL HIGH (ref 6–20)
CALCIUM: 9.4 mg/dL (ref 8.9–10.3)
CO2: 21 mmol/L — AB (ref 22–32)
Chloride: 104 mmol/L (ref 101–111)
Creatinine, Ser: 11.2 mg/dL — ABNORMAL HIGH (ref 0.61–1.24)
GFR calc non Af Amer: 4 mL/min — ABNORMAL LOW (ref >60)
GFR, EST AFRICAN AMERICAN: 5 mL/min — AB (ref >60)
GLUCOSE: 123 mg/dL — AB (ref 65–99)
Potassium: 3.9 mmol/L (ref 3.5–5.1)
Sodium: 141 mmol/L (ref 135–145)

## 2018-01-26 LAB — I-STAT TROPONIN, ED: TROPONIN I, POC: 0.03 ng/mL (ref 0.00–0.08)

## 2018-01-26 LAB — CBC
HCT: 34.2 % — ABNORMAL LOW (ref 39.0–52.0)
HEMOGLOBIN: 11.4 g/dL — AB (ref 13.0–17.0)
MCH: 33.4 pg (ref 26.0–34.0)
MCHC: 33.3 g/dL (ref 30.0–36.0)
MCV: 100.3 fL — ABNORMAL HIGH (ref 78.0–100.0)
Platelets: 258 10*3/uL (ref 150–400)
RBC: 3.41 MIL/uL — AB (ref 4.22–5.81)
RDW: 15.3 % (ref 11.5–15.5)
WBC: 11.1 10*3/uL — ABNORMAL HIGH (ref 4.0–10.5)

## 2018-01-26 LAB — BRAIN NATRIURETIC PEPTIDE: B Natriuretic Peptide: 1720.1 pg/mL — ABNORMAL HIGH (ref 0.0–100.0)

## 2018-01-26 LAB — TROPONIN I
TROPONIN I: 0.06 ng/mL — AB (ref <0.03)
TROPONIN I: 0.04 ng/mL — AB (ref <0.03)
Troponin I: 0.05 ng/mL (ref <0.03)

## 2018-01-26 MED ORDER — NITROGLYCERIN IN D5W 200-5 MCG/ML-% IV SOLN
0.0000 ug/min | INTRAVENOUS | Status: DC
  Administered 2018-01-26: 180 ug/min via INTRAVENOUS

## 2018-01-26 MED ORDER — HYDRALAZINE HCL 20 MG/ML IJ SOLN: 5.0000 mg | INTRAMUSCULAR | Status: DC | PRN

## 2018-01-26 MED ORDER — DICLOFENAC SODIUM 1 % TD GEL
2.0000 g | Freq: Three times a day (TID) | TRANSDERMAL | Status: DC
  Administered 2018-01-26 – 2018-01-27 (×2): 2 g via TOPICAL
  Filled 2018-01-26 (×2): qty 100

## 2018-01-26 MED ORDER — SODIUM CHLORIDE 0.9 % IV SOLN: 250.0000 mL | INTRAVENOUS | Status: DC | PRN

## 2018-01-26 MED ORDER — SODIUM CHLORIDE 0.9 % IV SOLN: 100.0000 mL | INTRAVENOUS | Status: DC | PRN

## 2018-01-26 MED ORDER — AMLODIPINE BESYLATE 10 MG PO TABS
10.0000 mg | ORAL_TABLET | Freq: Every day | ORAL | Status: DC
  Administered 2018-01-26 (×2): 10 mg via ORAL
  Filled 2018-01-26: qty 2
  Filled 2018-01-26: qty 1

## 2018-01-26 MED ORDER — LORAZEPAM 2 MG/ML IJ SOLN
0.0000 mg | Freq: Four times a day (QID) | INTRAMUSCULAR | Status: DC
Start: 2018-01-26 — End: 2018-01-27
  Administered 2018-01-26: 2 mg via INTRAVENOUS
  Filled 2018-01-26: qty 1

## 2018-01-26 MED ORDER — DIPHENHYDRAMINE-APAP (SLEEP) 25-500 MG PO TABS: 1.0000 | ORAL_TABLET | Freq: Every evening | ORAL | Status: DC | PRN

## 2018-01-26 MED ORDER — ACETAMINOPHEN 500 MG PO TABS: 500.0000 mg | ORAL_TABLET | Freq: Every evening | ORAL | Status: DC | PRN

## 2018-01-26 MED ORDER — HEPARIN SODIUM (PORCINE) 5000 UNIT/ML IJ SOLN
5000.0000 [IU] | Freq: Three times a day (TID) | INTRAMUSCULAR | Status: DC
  Administered 2018-01-26 – 2018-01-27 (×2): 5000 [IU] via SUBCUTANEOUS
  Filled 2018-01-26 (×2): qty 1

## 2018-01-26 MED ORDER — LIDOCAINE HCL (PF) 1 % IJ SOLN: 5.0000 mL | INTRAMUSCULAR | Status: DC | PRN

## 2018-01-26 MED ORDER — HEPARIN SODIUM (PORCINE) 1000 UNIT/ML DIALYSIS: 1000.0000 [IU] | INTRAMUSCULAR | Status: DC | PRN

## 2018-01-26 MED ORDER — NITROGLYCERIN 2 % TD OINT
1.0000 [in_us] | TOPICAL_OINTMENT | Freq: Once | TRANSDERMAL | Status: AC
  Administered 2018-01-26: 1 [in_us] via TOPICAL
  Filled 2018-01-26: qty 1

## 2018-01-26 MED ORDER — ADULT MULTIVITAMIN W/MINERALS CH
1.0000 | ORAL_TABLET | Freq: Every day | ORAL | Status: DC
  Administered 2018-01-27: 1 via ORAL
  Filled 2018-01-26: qty 1

## 2018-01-26 MED ORDER — ACETAMINOPHEN 325 MG PO TABS: 650.0000 mg | ORAL_TABLET | Freq: Four times a day (QID) | ORAL | Status: DC | PRN

## 2018-01-26 MED ORDER — LORAZEPAM 2 MG/ML IJ SOLN: 0.0000 mg | Freq: Two times a day (BID) | INTRAMUSCULAR | Status: DC

## 2018-01-26 MED ORDER — LORAZEPAM 2 MG/ML IJ SOLN
INTRAMUSCULAR | Status: AC
  Filled 2018-01-26: qty 1

## 2018-01-26 MED ORDER — ALTEPLASE 2 MG IJ SOLR: 2.0000 mg | Freq: Once | INTRAMUSCULAR | Status: DC | PRN

## 2018-01-26 MED ORDER — LORAZEPAM 2 MG/ML IJ SOLN: 1.0000 mg | Freq: Four times a day (QID) | INTRAMUSCULAR | Status: DC | PRN

## 2018-01-26 MED ORDER — NITROGLYCERIN IN D5W 200-5 MCG/ML-% IV SOLN
0.0000 ug/min | Freq: Once | INTRAVENOUS | Status: AC
  Administered 2018-01-26: 20 ug/min via INTRAVENOUS
  Filled 2018-01-26: qty 250

## 2018-01-26 MED ORDER — HYDRALAZINE HCL 20 MG/ML IJ SOLN
10.0000 mg | Freq: Once | INTRAMUSCULAR | Status: DC
Start: 2018-01-26 — End: 2018-01-26

## 2018-01-26 MED ORDER — THIAMINE HCL 100 MG/ML IJ SOLN
100.0000 mg | Freq: Every day | INTRAMUSCULAR | Status: DC
  Filled 2018-01-26: qty 2

## 2018-01-26 MED ORDER — SODIUM CHLORIDE 0.9% FLUSH
3.0000 mL | Freq: Two times a day (BID) | INTRAVENOUS | Status: DC
  Administered 2018-01-26: 3 mL via INTRAVENOUS

## 2018-01-26 MED ORDER — PENTAFLUOROPROP-TETRAFLUOROETH EX AERO: 1.0000 "application " | INHALATION_SPRAY | CUTANEOUS | Status: DC | PRN

## 2018-01-26 MED ORDER — LORAZEPAM 1 MG PO TABS: 1.0000 mg | ORAL_TABLET | Freq: Four times a day (QID) | ORAL | Status: DC | PRN

## 2018-01-26 MED ORDER — SODIUM CHLORIDE 0.9% FLUSH: 3.0000 mL | INTRAVENOUS | Status: DC | PRN

## 2018-01-26 MED ORDER — FOLIC ACID 1 MG PO TABS
1.0000 mg | ORAL_TABLET | Freq: Every day | ORAL | Status: DC
  Administered 2018-01-27: 1 mg via ORAL
  Filled 2018-01-26: qty 1

## 2018-01-26 MED ORDER — ASPIRIN EC 81 MG PO TBEC
81.0000 mg | DELAYED_RELEASE_TABLET | Freq: Every day | ORAL | Status: DC
  Administered 2018-01-27: 81 mg via ORAL
  Filled 2018-01-26: qty 1

## 2018-01-26 MED ORDER — FUROSEMIDE 10 MG/ML IJ SOLN
80.0000 mg | Freq: Once | INTRAMUSCULAR | Status: AC
  Administered 2018-01-26: 80 mg via INTRAVENOUS
  Filled 2018-01-26: qty 8

## 2018-01-26 MED ORDER — CALCIUM ACETATE (PHOS BINDER) 667 MG PO CAPS
2001.0000 mg | ORAL_CAPSULE | Freq: Three times a day (TID) | ORAL | Status: DC
  Administered 2018-01-26 – 2018-01-27 (×2): 2001 mg via ORAL
  Filled 2018-01-26 (×5): qty 3

## 2018-01-26 MED ORDER — CALCIUM ACETATE (PHOS BINDER) 667 MG PO CAPS
1334.0000 mg | ORAL_CAPSULE | ORAL | Status: DC | PRN
  Filled 2018-01-26: qty 2

## 2018-01-26 MED ORDER — DIPHENHYDRAMINE HCL 25 MG PO CAPS: 25.0000 mg | ORAL_CAPSULE | Freq: Every evening | ORAL | Status: DC | PRN

## 2018-01-26 MED ORDER — VITAMIN B-1 100 MG PO TABS
100.0000 mg | ORAL_TABLET | Freq: Every day | ORAL | Status: DC
  Administered 2018-01-27: 100 mg via ORAL
  Filled 2018-01-26: qty 1

## 2018-01-26 MED ORDER — ONDANSETRON HCL 4 MG/2ML IJ SOLN: 4.0000 mg | Freq: Three times a day (TID) | INTRAMUSCULAR | Status: DC | PRN

## 2018-01-26 MED ORDER — LIDOCAINE-PRILOCAINE 2.5-2.5 % EX CREA: 1.0000 "application " | TOPICAL_CREAM | CUTANEOUS | Status: DC | PRN

## 2018-01-26 MED ORDER — METOPROLOL TARTRATE 12.5 MG HALF TABLET
12.5000 mg | ORAL_TABLET | Freq: Two times a day (BID) | ORAL | Status: DC
  Administered 2018-01-26 – 2018-01-27 (×3): 12.5 mg via ORAL
  Filled 2018-01-26 (×3): qty 1

## 2018-01-26 MED ORDER — HEPARIN SODIUM (PORCINE) 1000 UNIT/ML DIALYSIS: 20.0000 [IU]/kg | INTRAMUSCULAR | Status: DC | PRN

## 2018-01-26 NOTE — Progress Notes (Signed)
Pt arrived from hemodialysis at 15:30.  Vital signs taken, telemetry hooked up.  Pt oriented to room and assisted into hospital gown.  Pt CIWA score  0.

## 2018-01-26 NOTE — H&P (Signed)
History and Physical    [name] TGG:[id number] DOB: 08/15/1956 DOA: 01/26/2018  Referring MD/NP/PA:   PCP: Patient, No Pcp Per   Patient coming from:  The patient is coming from home.  At baseline, pt is independent for most of ADL.   Chief Complaint: SOB  HPI: Kenneth [name] is a 62 y.o. male with medical history significant of ESRD-HD (TTS), hypertension, alcohol abuse, dCHF, atrial fibrillation not on anticoagulants, who presents with SOB.   Patient has is sleepy, and very reluctant to provide detailed medical history. The history is limited. Pt states that his SOB started yesterday, which has been progressively getting worse. He denies chest pain. He has cough, but no fever or chills. He denies nausea, vomiting, diarrhea, abdominal pain, symptoms of UTI or unilateral weakness. He states that he had dialysis yesterday. Per EMS report, pt was pale, diaphoretic, rales in all fields.   ED Course: pt was found to have WBC 11.1, negative troponin, BNP 1721, potassium 4.0, bicarbonate is 21, creatinine 12.57, BUN 47, temperature normal. Patient's blood pressure was elevated at 211/118 in ED. IV NTG gtt and BiPAP were started in ED. ABG with pH of 7.34, PCO2 41, PO2 159. Chest x-ray showed pulmonary edema. Pt is placed on SDU for obs.  Review of Systems:   General: no fevers, chills, has fatigue HEENT: no blurry vision, hearing changes or sore throat Respiratory: has dyspnea, coughing, no wheezing CV: no chest pain, no palpitations GI: no nausea, vomiting, abdominal pain, diarrhea, constipation GU: no dysuria, burning on urination, increased urinary frequency, hematuria  Ext: no leg edema Neuro: no unilateral weakness, numbness, or tingling, no vision change or hearing loss Skin: no rash, no skin tear. MSK: No muscle spasm, no deformity, no limitation of range of movement in spin Heme: No easy bruising.  Travel history: No recent long distant travel.  Allergy: No Known  Allergies  Past Medical History:  Diagnosis Date  . Cardiomyopathy secondary    likely related to HTN heart disease; possibly ETOH related as well  . Chronic combined systolic and diastolic heart failure (HCC)    Echocardiogram 09/22/11: Moderate LVH, EF 70-35%, grade 3 diastolic dysfunction, mild MR, moderate to severe LAE, mild RVE, mild to moderate TR, small to moderate pericardial effusion  . CKD (chronic kidney disease) stage 4, GFR 15-29 ml/min (HCC)    renal failure; due to hypertensive nephrosclerosis  . History of alcohol abuse   . Hypertension     Past Surgical History:  Procedure Laterality Date  . AV FISTULA PLACEMENT Left 05/14/2016   Procedure: LEFT ARM BASILIC VEIN TRANSPOSITION;  Surgeon: Rosetta Posner, MD;  Location: [place] [facility];  Service: Vascular;  Laterality: Left;  . PERIPHERAL VASCULAR CATHETERIZATION N/A 05/13/2016   Procedure: Dialysis/Perma Catheter Insertion;  Surgeon: Serafina Mitchell, MD;  Location: [facility];  Service: Cardiovascular;  Laterality: N/A;    Social History:  reports that he quit smoking about 13 months ago. His smoking use included cigars. He has quit using smokeless tobacco. He reports that he drinks alcohol. He reports that he does not use drugs.  Family History:  Family History  Problem Relation Age of Onset  . Emphysema Mother   . Cirrhosis Father      Prior to Admission medications   Medication Sig Start Date End Date Taking? Authorizing Provider  acetaminophen (TYLENOL) 325 MG tablet Take 2 tablets (650 mg total) by mouth every 6 (six) hours as needed for mild pain (or Fever >/=  101). 10/11/16   Domenic Polite, MD  amLODipine (NORVASC) 10 MG tablet Take 10 mg by mouth at bedtime.    [provider]  calcium acetate (PHOSLO) 667 MG capsule Take 1,334-2,001 mg by mouth See admin instructions. 2,001 mg three times a day with meals and 1,334 mg two times a day with snacks    [provider]  diclofenac sodium  (VOLTAREN) 1 % GEL Apply 2 g topically 4 (four) times daily. For back pain and spasm 12/04/17   Tawny Asal, MD  diphenhydramine-acetaminophen (TYLENOL PM) 25-500 MG TABS tablet Take 1 tablet by mouth at bedtime as needed (for sleep).    [provider]  metoprolol tartrate (LOPRESSOR) 25 MG tablet Take a half tablet (12.5 mg) twice a day. Do not take on mornings of dialysis 12/04/17   Tawny Asal, MD    Physical Exam: Vitals:   01/26/18 0336 01/26/18 0345 01/26/18 0400 01/26/18 0415  BP:  (!) 164/107 (!) 152/102 (!) 195/115  Pulse: 93 86 85 85  Resp: 20 17 13 16   Temp:      TempSrc:      SpO2: 100% 100% 99% 100%  Weight:      Height:       General: Not in acute distress HEENT:       Eyes: PERRL, EOMI, no scleral icterus.       ENT: No discharge from the ears and nose, no pharynx injection, no tonsillar enlargement.        Neck: positive JVD, no bruit, no mass felt. Heme: No neck lymph node enlargement. Cardiac: S1/S2, RRR, No murmurs, No gallops or rubs. Respiratory: has rales bilaterally. GI: Soft, nondistended, nontender, no rebound pain, no organomegaly, BS present. GU: No hematuria Ext: No pitting leg edema bilaterally. 2+DP/PT pulse bilaterally. Musculoskeletal: No joint deformities, No joint redness or warmth, no limitation of ROM in spin. Skin: No rashes.  Neuro: sleepy, but arousable and  oriented X3, cranial nerves II-XII grossly intact, moves all extremities normally.  Psych: Patient is not psychotic, no suicidal or hemocidal ideation.  Labs on Admission: I have personally reviewed following labs and imaging studies  CBC: Recent Labs  Lab 01/26/18 0040 01/26/18 0143  WBC 11.1*  --   HGB 11.4* 11.9*  HCT 34.2* 35.0*  MCV 100.3*  --   PLT 258  --    Basic Metabolic Panel: Recent Labs  Lab 01/26/18 0040 01/26/18 0143  NA 141 142  K 3.9 4.6  CL 104 106  CO2 21*  --   GLUCOSE 123* 98  BUN 44* 47*  CREATININE 11.20* 12.50*  CALCIUM 9.4  --     GFR: Estimated Creatinine Clearance: 6.8 mL/min (A) (by C-G formula based on SCr of 12.5 mg/dL (H)). Liver Function Tests: No results for input(s): AST, ALT, ALKPHOS, BILITOT, PROT, ALBUMIN in the last 168 hours. No results for input(s): LIPASE, AMYLASE in the last 168 hours. No results for input(s): AMMONIA in the last 168 hours. Coagulation Profile: No results for input(s): INR, PROTIME in the last 168 hours. Cardiac Enzymes: No results for input(s): CKTOTAL, CKMB, CKMBINDEX, TROPONINI in the last 168 hours. BNP (last 3 results) No results for input(s): PROBNP in the last 8760 hours. HbA1C: No results for input(s): HGBA1C in the last 72 hours. CBG: No results for input(s): GLUCAP in the last 168 hours. Lipid Profile: No results for input(s): CHOL, HDL, LDLCALC, TRIG, CHOLHDL, LDLDIRECT in the last 72 hours. Thyroid Function Tests: No results for  input(s): TSH, T4TOTAL, FREET4, T3FREE, THYROIDAB in the last 72 hours. Anemia Panel: No results for input(s): VITAMINB12, FOLATE, FERRITIN, TIBC, IRON, RETICCTPCT in the last 72 hours. Urine analysis:    Component Value Date/Time   COLORURINE YELLOW 05/12/2016 0834   APPEARANCEUR CLOUDY (A) 05/12/2016 0834   LABSPEC 1.014 05/12/2016 0834   PHURINE 5.0 05/12/2016 0834   GLUCOSEU NEGATIVE 05/12/2016 0834   HGBUR MODERATE (A) 05/12/2016 0834   BILIRUBINUR NEGATIVE 05/12/2016 0834   KETONESUR NEGATIVE 05/12/2016 0834   PROTEINUR 100 (A) 05/12/2016 0834   UROBILINOGEN 0.2 12/09/2013 0013   NITRITE NEGATIVE 05/12/2016 0834   LEUKOCYTESUR NEGATIVE 05/12/2016 0834   Sepsis Labs: @LABRCNTIP (procalcitonin:4,lacticidven:4) )No results found for this or any previous visit (from the past 240 hour(s)).   Radiological Exams on Admission: Dg Chest Portable 1 View  Result Date: 01/26/2018 CLINICAL DATA:  Short of breath EXAM: PORTABLE CHEST 1 VIEW COMPARISON:  12/01/2017, 07/28/2017 FINDINGS: Diffuse interstitial and ground-glass opacity.  Mild cardiomegaly with vascular congestion. No focal consolidation. No pleural effusion. Aortic atherosclerosis. No pneumothorax. IMPRESSION: 1. Borderline to mild cardiomegaly. Vascular congestion with diffuse interstitial and ground-glass opacity, suspect for pulmonary edema. 2. No focal pulmonary airspace disease.  No pleural effusion. Electronically Signed   By: Donavan Foil M.D.   On: 01/26/2018 00:49     EKG: Independently reviewed.  Sinus rhythm, QTC 462, anteroseptal infarction pattern, PAC.  Assessment/Plan Principal Problem:   Acute respiratory failure with hypoxia (HCC) Active Problems:   Hypertensive emergency   ESRD on dialysis [facility])   Alcohol abuse   Acute on chronic diastolic CHF (congestive heart failure) (HCC)   Atrial fibrillation and flutter ([place])   Pulmonary edema   Acute respiratory failure with hypoxia due to acute pulmonary edema/acute on chronic diastolic CHF exacerbation/hypertensive emergency: Patient's blood (elevated to 11/118. Patient has pulmonary edema on chest x-ray, and positive JVD, consistent with CHF exacerbation or pulmonary edema. Patient just had a 2-D echo on 12/02/17, which showed EF 65-70 percent with grade 1 diastolic dysfunction. Will not repeat 2-D echo today.  - Will place in SDU for obs - Continue Nitroglycerin drip for now, the of goal of bp reduction is by about 25 to 30% in first several hours, at SBP 160 to 180 mmHg. - pt received one dose of lasix 80 mg in ED - Frequent neuro check   - continue home bp meds: amlodipine, metoprolol. - trop x 3.   ESRD (end stage renal disease) on dialysis (TTS):  -Left message to renal box for possible extra HD today  Alcohol abuse: -CIWA protocol  Hx of Atrial fibrillation and flutter ([place]): CHA2DS2-VASc Score is 2, needs oral anticoagulation, but patient is not on AC at home, not sure why he is not on [facility]. Heart rate is 90-100s. -continue metoprolol   DVT ppx:  SQ Lovenox Code Status: Full  code Family Communication: None at bed side. Disposition Plan:  Anticipate discharge back to previous home environment Consults called:  none Admission status:  SDU/obs   Date of Service 01/26/2018    Ivor Costa [facility] Pager [phone]  If 7PM-7AM, please contact night-coverage [email or web address] Password [facility] 01/26/2018, 4:27 AM

## 2018-01-26 NOTE — Progress Notes (Signed)
Pt taken off Bipap at this time. Pt on RA, Spo2 100%.  No distress noted. RT will monitor

## 2018-01-26 NOTE — Procedures (Signed)
   I was present at this dialysis session, have reviewed the session itself and made  appropriate changes Kelly Splinter MD [place] pager [phone]   01/26/2018, 1:31 PM

## 2018-01-26 NOTE — ED Triage Notes (Signed)
BIB EMS from home, reports SOB X1 day that had worsened w/i past hr. Upon EMS arrival pt pale, diaphoretic, rales in all fields. Hx CHF, dialysis - T,Th,S. Hypertensive en route

## 2018-01-26 NOTE — Progress Notes (Signed)
Pt transported on Bipap from ED to HD.  Pt's vitals remained stable throughout.

## 2018-01-26 NOTE — Progress Notes (Signed)
Patient admitted after midnight, please see H&P.  Here with volume overload, suspect he needs HD sooner rather than later.  Was requiring [place] DO

## 2018-01-26 NOTE — ED Notes (Signed)
Nitro paste removed from chest; nitro gtt started for hypertension

## 2018-01-26 NOTE — ED Provider Notes (Addendum)
[name] DE[facility]MENT Provider Note   CSN: [id number] Arrival date & time: 01/26/18  0025     History   Chief Complaint Chief Complaint  Patient presents with  . Respiratory Distress    HPI Kenneth [name] is a 62 y.o. male.  The history is provided by the EMS personnel.  Shortness of Breath  This is a recurrent problem. The average episode lasts 1 day. The problem occurs continuously.The current episode started yesterday. The problem has been rapidly worsening. Associated symptoms include wheezing. Pertinent negatives include no fever, no chest pain and no syncope. The problem's precipitants include medical treatment. Risk factors: none. He has tried nothing for the symptoms. The treatment provided no relief. He has had prior hospitalizations. He has had prior ED visits. Associated medical issues include heart failure.  Started yesterday.  TTS dialysis.  Non missed.  Worse today.  Solumedrol and nebs given en route.    Past Medical History:  Diagnosis Date  . Cardiomyopathy secondary    likely related to HTN heart disease; possibly ETOH related as well  . Chronic combined systolic and diastolic heart failure (HCC)    Echocardiogram 09/22/11: Moderate LVH, EF 27-03%, grade 3 diastolic dysfunction, mild MR, moderate to severe LAE, mild RVE, mild to moderate TR, small to moderate pericardial effusion  . CKD (chronic kidney disease) stage 4, GFR 15-29 ml/min (HCC)    renal failure; due to hypertensive nephrosclerosis  . History of alcohol abuse   . Hypertension     Patient Active Problem List   Diagnosis Date Noted  . Atrial fibrillation and flutter ([place])   . Shortness of breath   . Acute combined systolic and diastolic heart failure ([place])   . Hypothermia 10/08/2016  . ESRD on hemodialysis ([place]) 10/08/2016  . Fall 10/08/2016  . Syncope 10/07/2016  . Near syncope 07/07/2016  . Cardiomyopathy ([place]) 07/07/2016  . Ventricular tachycardia, non-sustained  ([place]) 07/06/2016  . Hypoglycemia 07/06/2016  . Alcohol abuse 05/13/2016  . ESRD on dialysis ([place]) 05/12/2016  . Hypertensive urgency 12/08/2013  . Cardiomyopathy secondary 10/09/2011  . Essential hypertension 10/09/2011  . Alcohol withdrawal delirium ([place]) 09/25/2011  . Systolic and diastolic CHF, chronic ([place]) 09/24/2011    Past Surgical History:  Procedure Laterality Date  . AV FISTULA PLACEMENT Left 05/14/2016   Procedure: LEFT ARM BASILIC VEIN TRANSPOSITION;  Surgeon: Rosetta Posner, MD;  Location: [place];  Service: Vascular;  Laterality: Left;  . PERIPHERAL VASCULAR CATHETERIZATION N/A 05/13/2016   Procedure: Dialysis/Perma Catheter Insertion;  Surgeon: Serafina Mitchell, MD;  Location: [place] [facility];  Service: Cardiovascular;  Laterality: N/A;        Home Medications    Prior to Admission medications   Medication Sig Start Date End Date Taking? Authorizing Provider  acetaminophen (TYLENOL) 325 MG tablet Take 2 tablets (650 mg total) by mouth every 6 (six) hours as needed for mild pain (or Fever >/= 101). 10/11/16   Domenic Polite, MD  amLODipine (NORVASC) 10 MG tablet Take 10 mg by mouth at bedtime.    [provider]  calcium acetate (PHOSLO) 667 MG capsule Take 1,334-2,001 mg by mouth See admin instructions. 2,001 mg three times a day with meals and 1,334 mg two times a day with snacks    [provider]  diclofenac sodium (VOLTAREN) 1 % GEL Apply 2 g topically 4 (four) times daily. For back pain and spasm 12/04/17   Tawny Asal, MD  diphenhydramine-acetaminophen (TYLENOL PM) 25-500  MG TABS tablet Take 1 tablet by mouth at bedtime as needed (for sleep).    [provider]  metoprolol tartrate (LOPRESSOR) 25 MG tablet Take a half tablet (12.5 mg) twice a day. Do not take on mornings of dialysis 12/04/17   Tawny Asal, MD    Family History Family History  Problem Relation Age of Onset  . Emphysema Mother   . Cirrhosis Father     Social  History Social History   Tobacco Use  . Smoking status: Former Smoker    Types: Cigars    Last attempt to quit: 11/27/2016    Years since quitting: 1.1  . Smokeless tobacco: Former Network engineer Use Topics  . Alcohol use: No    Frequency: Never    Comment: occ  . Drug use: No     Allergies   Patient has no known allergies.   Review of Systems Review of Systems  Unable to perform ROS: Acuity of condition  Constitutional: Negative for diaphoresis and fever.  Respiratory: Positive for shortness of breath and wheezing. Negative for stridor.   Cardiovascular: Negative for chest pain and syncope.     Physical Exam Updated Vital Signs BP (!) 184/114   Pulse 95   Temp (!) 97.4 F (36.3 C) (Temporal)   Resp 20   Ht 5\' 10"  (1.778 m)   Wt 93 kg (205 lb)   SpO2 100%   BMI 29.41 kg/m   Physical Exam  Constitutional: He appears well-developed and well-nourished. He appears distressed.  HENT:  Head: Normocephalic and atraumatic.  Nose: Nose normal.  Eyes: Pupils are equal, round, and reactive to light. Conjunctivae are normal.  Neck: Normal range of motion. Neck supple.  Cardiovascular: Normal rate, regular rhythm, normal heart sounds and intact distal pulses.  Pulmonary/Chest: He has rales.  Abdominal: Soft. Bowel sounds are normal. He exhibits no mass. There is no tenderness. There is no rebound and no guarding.  Musculoskeletal: Normal range of motion. He exhibits no edema.  Lymphadenopathy:    He has no cervical adenopathy.  Neurological: He is alert.  Skin: Skin is warm and dry. Capillary refill takes less than 2 seconds.  Psychiatric:  unable     ED Treatments / Results  Labs (all labs ordered are listed, but only abnormal results are displayed) Results for orders placed or performed during the hospital encounter of 16/10/96  Basic metabolic panel  Result Value Ref Range   Sodium 141 135 - 145 mmol/L   Potassium 3.9 3.5 - 5.1 mmol/L   Chloride 104 101 -  111 mmol/L   CO2 21 (L) 22 - 32 mmol/L   Glucose, Bld 123 (H) 65 - 99 mg/dL   BUN 44 (H) 6 - 20 mg/dL   Creatinine, Ser 11.20 (H) 0.61 - 1.24 mg/dL   Calcium 9.4 8.9 - 10.3 mg/dL   GFR calc non Af Amer 4 (L) >60 mL/min   GFR calc Af Amer 5 (L) >60 mL/min   Anion gap 16 (H) 5 - 15  CBC  Result Value Ref Range   WBC 11.1 (H) 4.0 - 10.5 K/uL   RBC 3.41 (L) 4.22 - 5.81 MIL/uL   Hemoglobin 11.4 (L) 13.0 - 17.0 g/dL   HCT 34.2 (L) 39.0 - 52.0 %   MCV 100.3 (H) 78.0 - 100.0 fL   MCH 33.4 26.0 - 34.0 pg   MCHC 33.3 30.0 - 36.0 g/dL   RDW 15.3 11.5 - 15.5 %   Platelets 258  150 - 400 K/uL  Brain natriuretic peptide  Result Value Ref Range   B Natriuretic Peptide 1,720.1 (H) 0.0 - 100.0 pg/mL  I-stat troponin, ED  Result Value Ref Range   Troponin i, poc 0.03 0.00 - 0.08 ng/mL   Comment 3          I-stat chem 8, ed  Result Value Ref Range   Sodium 142 135 - 145 mmol/L   Potassium 4.6 3.5 - 5.1 mmol/L   Chloride 106 101 - 111 mmol/L   BUN 47 (H) 6 - 20 mg/dL   Creatinine, Ser 12.50 (H) 0.61 - 1.24 mg/dL   Glucose, Bld 98 65 - 99 mg/dL   Calcium, Ion 1.15 1.15 - 1.40 mmol/L   TCO2 27 22 - 32 mmol/L   Hemoglobin 11.9 (L) 13.0 - 17.0 g/dL   HCT 35.0 (L) 39.0 - 52.0 %  I-Stat Arterial Blood Gas, ED - (order at [facility] and [facility] only)  Result Value Ref Range   pH, Arterial 7.340 (L) 7.350 - 7.450   pCO2 arterial 41.2 32.0 - 48.0 mmHg   pO2, Arterial 159.0 (H) 83.0 - 108.0 mmHg   Bicarbonate 22.2 20.0 - 28.0 mmol/L   TCO2 23 22 - 32 mmol/L   O2 Saturation 99.0 %   Acid-base deficit 3.0 (H) 0.0 - 2.0 mmol/L   Patient temperature 98.6 F    Collection site RADIAL, ALLEN'S TEST ACCEPTABLE    Drawn by RT    Sample type ARTERIAL    Dg Chest Portable 1 View  Result Date: 01/26/2018 CLINICAL DATA:  Short of breath EXAM: PORTABLE CHEST 1 VIEW COMPARISON:  12/01/2017, 07/28/2017 FINDINGS: Diffuse interstitial and ground-glass opacity. Mild cardiomegaly with vascular congestion. No focal  consolidation. No pleural effusion. Aortic atherosclerosis. No pneumothorax. IMPRESSION: 1. Borderline to mild cardiomegaly. Vascular congestion with diffuse interstitial and ground-glass opacity, suspect for pulmonary edema. 2. No focal pulmonary airspace disease.  No pleural effusion. Electronically Signed   By: Donavan Foil M.D.   On: 01/26/2018 00:49    EKG EKG Interpretation  Date/Time:  Tuesday Frayda Egley 02 2019 00:30:52 EDT Ventricular Rate:  94 PR Interval:    QRS Duration: 97 QT Interval:  369 QTC Calculation: 462 R Axis:   55 Text Interpretation:  Sinus rhythm Supraventricular bigeminy Anteroseptal infarct, old Minimal ST depression, lateral leads Confirmed by Randal Buba, Alistar Mcenery ([id number]) on 01/26/2018 1:10:03 AM   Radiology Dg Chest Portable 1 View  Result Date: 01/26/2018 CLINICAL DATA:  Short of breath EXAM: PORTABLE CHEST 1 VIEW COMPARISON:  12/01/2017, 07/28/2017 FINDINGS: Diffuse interstitial and ground-glass opacity. Mild cardiomegaly with vascular congestion. No focal consolidation. No pleural effusion. Aortic atherosclerosis. No pneumothorax. IMPRESSION: 1. Borderline to mild cardiomegaly. Vascular congestion with diffuse interstitial and ground-glass opacity, suspect for pulmonary edema. 2. No focal pulmonary airspace disease.  No pleural effusion. Electronically Signed   By: Donavan Foil M.D.   On: 01/26/2018 00:49    Procedures Procedures (including critical care time)  Medications Ordered in ED Medications  nitroGLYCERIN (NITROGLYN) 2 % ointment 1 inch (1 inch Topical Given 01/26/18 0115)  furosemide (LASIX) injection 80 mg (80 mg Intravenous Given 01/26/18 0115)  nitroGLYCERIN 50 mg in dextrose 5 % 250 mL (0.2 mg/mL) infusion (40 mcg/min Intravenous Rate/Dose Change 01/26/18 0235)     Results for orders placed or performed during the hospital encounter of 08/20/84  Basic metabolic panel  Result Value Ref Range   Sodium 141 135 - 145 mmol/L   Potassium 3.9 3.5 -  5.1 mmol/L    Chloride 104 101 - 111 mmol/L   CO2 21 (L) 22 - 32 mmol/L   Glucose, Bld 123 (H) 65 - 99 mg/dL   BUN 44 (H) 6 - 20 mg/dL   Creatinine, Ser 11.20 (H) 0.61 - 1.24 mg/dL   Calcium 9.4 8.9 - 10.3 mg/dL   GFR calc non Af Amer 4 (L) >60 mL/min   GFR calc Af Amer 5 (L) >60 mL/min   Anion gap 16 (H) 5 - 15  CBC  Result Value Ref Range   WBC 11.1 (H) 4.0 - 10.5 K/uL   RBC 3.41 (L) 4.22 - 5.81 MIL/uL   Hemoglobin 11.4 (L) 13.0 - 17.0 g/dL   HCT 34.2 (L) 39.0 - 52.0 %   MCV 100.3 (H) 78.0 - 100.0 fL   MCH 33.4 26.0 - 34.0 pg   MCHC 33.3 30.0 - 36.0 g/dL   RDW 15.3 11.5 - 15.5 %   Platelets 258 150 - 400 K/uL  Brain natriuretic peptide  Result Value Ref Range   B Natriuretic Peptide 1,720.1 (H) 0.0 - 100.0 pg/mL  I-stat troponin, ED  Result Value Ref Range   Troponin i, poc 0.03 0.00 - 0.08 ng/mL   Comment 3          I-stat chem 8, ed  Result Value Ref Range   Sodium 142 135 - 145 mmol/L   Potassium 4.6 3.5 - 5.1 mmol/L   Chloride 106 101 - 111 mmol/L   BUN 47 (H) 6 - 20 mg/dL   Creatinine, Ser 12.50 (H) 0.61 - 1.24 mg/dL   Glucose, Bld 98 65 - 99 mg/dL   Calcium, Ion 1.15 1.15 - 1.40 mmol/L   TCO2 27 22 - 32 mmol/L   Hemoglobin 11.9 (L) 13.0 - 17.0 g/dL   HCT 35.0 (L) 39.0 - 52.0 %  I-Stat Arterial Blood Gas, ED - (order at [facility] and [facility] only)  Result Value Ref Range   pH, Arterial 7.340 (L) 7.350 - 7.450   pCO2 arterial 41.2 32.0 - 48.0 mmHg   pO2, Arterial 159.0 (H) 83.0 - 108.0 mmHg   Bicarbonate 22.2 20.0 - 28.0 mmol/L   TCO2 23 22 - 32 mmol/L   O2 Saturation 99.0 %   Acid-base deficit 3.0 (H) 0.0 - 2.0 mmol/L   Patient temperature 98.6 F    Collection site RADIAL, ALLEN'S TEST ACCEPTABLE    Drawn by RT    Sample type ARTERIAL    Dg Chest Portable 1 View  Result Date: 01/26/2018 CLINICAL DATA:  Short of breath EXAM: PORTABLE CHEST 1 VIEW COMPARISON:  12/01/2017, 07/28/2017 FINDINGS: Diffuse interstitial and ground-glass opacity. Mild cardiomegaly with vascular  congestion. No focal consolidation. No pleural effusion. Aortic atherosclerosis. No pneumothorax. IMPRESSION: 1. Borderline to mild cardiomegaly. Vascular congestion with diffuse interstitial and ground-glass opacity, suspect for pulmonary edema. 2. No focal pulmonary airspace disease.  No pleural effusion. Electronically Signed   By: Donavan Foil M.D.   On: 01/26/2018 00:49   MDM Reviewed: previous chart, nursing note and vitals Interpretation: x-ray, ECG and labs (pulmonary edema by me on cxr elevated bnp) Total time providing critical care: 75-105 minutes. This excludes time spent performing separately reportable procedures and services. Consults: admitting MD  CRITICAL CARE Performed by: Carlisle Beers Total critical care time: 75 minutes Critical care time was exclusive of separately billable procedures and treating other patients. Critical care was necessary to treat or prevent imminent or life-threatening deterioration. Critical care was time  spent personally by me on the following activities: development of treatment plan with patient and/or surrogate as well as nursing, discussions with consultants, evaluation of patient's response to treatment, examination of patient, obtaining history from patient or surrogate, ordering and performing treatments and interventions, ordering and review of laboratory studies, ordering and review of radiographic studies, pulse oximetry and re-evaluation of patient's condition.  Final Clinical Impressions(s) / ED Diagnoses    PUlmonary edema: on BIPAP admit to medicine   Aleria Maheu, MD 01/26/18 [place], Arye Weyenberg, MD 01/26/18 4158

## 2018-01-26 NOTE — Progress Notes (Signed)
Patient currently on room air with sats of 96%. Patient is in no distress and all vitals are stable. BIPAP is not needed at this time. Will continue to monitor.

## 2018-01-26 NOTE — Consult Note (Addendum)
[facility] Renal Consultation Note    Indication for Consultation:  Management of ESRD/hemodialysis, anemia, hypertension/volume, and secondary hyperparathyroidism. PCP:  HPI: Kenneth [name] is a 62 y.o. male with HTN, ESRD, alcohol abuse who was admitted with dyspnea/volume overload.  He cannot give me much history. Developed SOB yesterday which prompted ED eval. Denies CP, fever, chills, N/V/D. He has been compliant with HD, last HD Saturday which he completed in entirety and met his EDW. In ED, required bi-pap for hypoxia. Labs showed K 3.9, Hgb 11.4, WBC 11.1. CXR consistent with pulm edema.  Dialyzes TTS at [facility], due for HD today. Using L AVF without recent issues.  Past Medical History:  Diagnosis Date  . Cardiomyopathy secondary    likely related to HTN heart disease; possibly ETOH related as well  . Chronic combined systolic and diastolic heart failure (HCC)    Echocardiogram 09/22/11: Moderate LVH, EF 40-81%, grade 3 diastolic dysfunction, mild MR, moderate to severe LAE, mild RVE, mild to moderate TR, small to moderate pericardial effusion  . CKD (chronic kidney disease) stage 4, GFR 15-29 ml/min (HCC)    renal failure; due to hypertensive nephrosclerosis  . History of alcohol abuse   . Hypertension    Past Surgical History:  Procedure Laterality Date  . AV FISTULA PLACEMENT Left 05/14/2016   Procedure: LEFT ARM BASILIC VEIN TRANSPOSITION;  Surgeon: Rosetta Posner, MD;  Location: [place];  Service: Vascular;  Laterality: Left;  . PERIPHERAL VASCULAR CATHETERIZATION N/A 05/13/2016   Procedure: Dialysis/Perma Catheter Insertion;  Surgeon: Serafina Mitchell, MD;  Location: [place] [facility];  Service: Cardiovascular;  Laterality: N/A;   Family History  Problem Relation Age of Onset  . Emphysema Mother   . Cirrhosis Father    Social History:  reports that he quit smoking about 13 months ago. His smoking use included cigars. He has quit using smokeless tobacco. He  reports that he drinks alcohol. He reports that he does not use drugs.  ROS: As per HPI otherwise negative.  Physical Exam: Vitals:   01/26/18 0800 01/26/18 0815 01/26/18 0915 01/26/18 0930  BP: (!) 155/89 (!) 171/96 (!) 161/89 (!) 168/91  Pulse: 84 82 73 72  Resp: 20 20 14 14   Temp:      TempSrc:      SpO2: 97% 99% 100% 100%  Weight:      Height:         General: Well developed, well nourished, in no acute distress. On bi-pap Neck: Supple without lymphadenopathy/masses. JVD not elevated. Lungs: Coarse air movement throughout without overt wheezing or rales Heart: RRR with normal S1, S2. No murmurs, rubs, or gallops appreciated. Abdomen: Soft, non-tender, non-distended with normoactive bowel sounds.  Musculoskeletal:  Strength and tone appear normal for age. Lower extremities: No edema or ischemic changes, no open wounds. Neuro: Alert and oriented X 3. Moves all extremities spontaneously. Dialysis Access: L AVF + bruit  No Known Allergies Prior to Admission medications   Medication Sig Start Date End Date Taking? Authorizing Provider  acetaminophen (TYLENOL) 325 MG tablet Take 2 tablets (650 mg total) by mouth every 6 (six) hours as needed for mild pain (or Fever >/= 101). 10/11/16   Domenic Polite, MD  amLODipine (NORVASC) 10 MG tablet Take 10 mg by mouth at bedtime.    [provider]  calcium acetate (PHOSLO) 667 MG capsule Take 1,334-2,001 mg by mouth See admin instructions. 2,001 mg three times a day with meals and 1,334 mg two  times a day with snacks    [provider]  diclofenac sodium (VOLTAREN) 1 % GEL Apply 2 g topically 4 (four) times daily. For back pain and spasm 12/04/17   Tawny Asal, MD  diphenhydramine-acetaminophen (TYLENOL PM) 25-500 MG TABS tablet Take 1 tablet by mouth at bedtime as needed (for sleep).    [provider]  metoprolol tartrate (LOPRESSOR) 25 MG tablet Take a half tablet (12.5 mg) twice a day. Do not take on mornings of  dialysis 12/04/17   Tawny Asal, MD   Current Facility-Administered Medications  Medication Dose Route Frequency Provider Last Rate Last Dose  . 0.9 %  sodium chloride infusion  250 mL Intravenous PRN Ivor Costa, MD      . diphenhydrAMINE (BENADRYL) capsule 25 mg  25 mg Oral QHS PRN Ivor Costa, MD       And  . acetaminophen (TYLENOL) tablet 500 mg  500 mg Oral QHS PRN Ivor Costa, MD      . acetaminophen (TYLENOL) tablet 650 mg  650 mg Oral Q6H PRN Ivor Costa, MD      . amLODipine (NORVASC) tablet 10 mg  10 mg Oral QHS Ivor Costa, MD   10 mg at 01/26/18 0436  . aspirin EC tablet 81 mg  81 mg Oral Daily Ivor Costa, MD      . calcium acetate (PHOSLO) capsule 1,334 mg  1,334 mg Oral PRN Ivor Costa, MD      . calcium acetate (PHOSLO) capsule 2,001 mg  2,001 mg Oral TID WC Ivor Costa, MD      . diclofenac sodium (VOLTAREN) 1 % transdermal gel 2 g  2 g Topical TID AC & HS Ivor Costa, MD      . folic acid (FOLVITE) tablet 1 mg  1 mg Oral Daily Ivor Costa, MD      . heparin injection 5,000 Units  5,000 Units Subcutaneous Q8H Ivor Costa, MD      . hydrALAZINE (APRESOLINE) injection 5 mg  5 mg Intravenous Q2H PRN Ivor Costa, MD      . LORazepam (ATIVAN) injection 0-4 mg  0-4 mg Intravenous Q6H Ivor Costa, MD       Followed by  . [START ON 01/28/2018] LORazepam (ATIVAN) injection 0-4 mg  0-4 mg Intravenous Q12H Ivor Costa, MD      . LORazepam (ATIVAN) tablet 1 mg  1 mg Oral Q6H PRN Ivor Costa, MD       Or  . LORazepam (ATIVAN) injection 1 mg  1 mg Intravenous Q6H PRN Ivor Costa, MD      . metoprolol tartrate (LOPRESSOR) tablet 12.5 mg  12.5 mg Oral BID Ivor Costa, MD   12.5 mg at 01/26/18 1700  . multivitamin with minerals tablet 1 tablet  1 tablet Oral Daily Ivor Costa, MD      . nitroGLYCERIN 50 mg in dextrose 5 % 250 mL (0.2 mg/mL) infusion  0-200 mcg/min Intravenous Continuous Ivor Costa, MD 54 mL/hr at 01/26/18 0436 180 mcg/min at 01/26/18 0436  . ondansetron (ZOFRAN) injection 4 mg  4 mg  Intravenous Q8H PRN Ivor Costa, MD      . sodium chloride flush (NS) 0.9 % injection 3 mL  3 mL Intravenous Q12H Ivor Costa, MD      . sodium chloride flush (NS) 0.9 % injection 3 mL  3 mL Intravenous PRN Ivor Costa, MD      . thiamine (VITAMIN B-1) tablet 100 mg  100 mg Oral Daily Niu,  Soledad Gerlach, MD       Or  . thiamine (B-1) injection 100 mg  100 mg Intravenous Daily Ivor Costa, MD       Current Outpatient Medications  Medication Sig Dispense Refill  . acetaminophen (TYLENOL) 325 MG tablet Take 2 tablets (650 mg total) by mouth every 6 (six) hours as needed for mild pain (or Fever >/= 101).    Marland Kitchen amLODipine (NORVASC) 10 MG tablet Take 10 mg by mouth at bedtime.    . calcium acetate (PHOSLO) 667 MG capsule Take 1,334-2,001 mg by mouth See admin instructions. 2,001 mg three times a day with meals and 1,334 mg two times a day with snacks    . diclofenac sodium (VOLTAREN) 1 % GEL Apply 2 g topically 4 (four) times daily. For back pain and spasm 1 Tube 0  . diphenhydramine-acetaminophen (TYLENOL PM) 25-500 MG TABS tablet Take 1 tablet by mouth at bedtime as needed (for sleep).    . metoprolol tartrate (LOPRESSOR) 25 MG tablet Take a half tablet (12.5 mg) twice a day. Do not take on mornings of dialysis 60 tablet 1   Labs: Basic Metabolic Panel: Recent Labs  Lab 01/26/18 0040 01/26/18 0143  NA 141 142  K 3.9 4.6  CL 104 106  CO2 21*  --   GLUCOSE 123* 98  BUN 44* 47*  CREATININE 11.20* 12.50*  CALCIUM 9.4  --    CBC: Recent Labs  Lab 01/26/18 0040 01/26/18 0143  WBC 11.1*  --   HGB 11.4* 11.9*  HCT 34.2* 35.0*  MCV 100.3*  --   PLT 258  --    Cardiac Enzymes: Recent Labs  Lab 01/26/18 0418  TROPONINI 0.05*   Studies/Results: Dg Chest Portable 1 View  Result Date: 01/26/2018 CLINICAL DATA:  Short of breath EXAM: PORTABLE CHEST 1 VIEW COMPARISON:  12/01/2017, 07/28/2017 FINDINGS: Diffuse interstitial and ground-glass opacity. Mild cardiomegaly with vascular congestion. No focal  consolidation. No pleural effusion. Aortic atherosclerosis. No pneumothorax. IMPRESSION: 1. Borderline to mild cardiomegaly. Vascular congestion with diffuse interstitial and ground-glass opacity, suspect for pulmonary edema. 2. No focal pulmonary airspace disease.  No pleural effusion. Electronically Signed   By: Donavan Foil M.D.   On: 01/26/2018 00:49   Dialysis Orders:  [facility] 4:15hr, 400/800, EDW 86.5kg, 2K/2Ca, AVF, heparin 2400 bolus - Mircera 67mg IV q 2 weeks (last 3/28) - Venofer 527mIV weekly - Calcitriol 2.65m765mPO q HD  Assessment/Plan: 1.  Dyspnea/pulm edema: For HD today, 4.5L UF goal. Hopefully can get off bi-pap soon. 2.  ESRD: Usual TTS schedule. Depending on how he feels after HD, may need another HD tomorrow. 3.  Hypertension/volume: See #1. For HD semi-urgently today. BP high. 4.  Anemia: Hgb 11.9. No ESA for now. 5.  Metabolic bone disease: Ca ok. Continue home meds for now.   KatVeneta PentonA-C 01/26/2018, 9:50 AM  [facility] Pager: [phone] seen, examined and agree w A/P as above.  RobKelly Splinter Car[employer]ell Rubbermaid[employer] [phone]/11/2017, 1:30 PM

## 2018-01-26 NOTE — ED Notes (Addendum)
Nephrology rounded on patient - stated dialysis would happen sometime today

## 2018-01-27 ENCOUNTER — Other Ambulatory Visit: Payer: Self-pay

## 2018-01-27 ENCOUNTER — Encounter (HOSPITAL_COMMUNITY): Payer: Self-pay | Admitting: General Practice

## 2018-01-27 DIAGNOSIS — Z992 Dependence on renal dialysis: Secondary | ICD-10-CM

## 2018-01-27 DIAGNOSIS — J9601 Acute respiratory failure with hypoxia: Secondary | ICD-10-CM

## 2018-01-27 DIAGNOSIS — F1010 Alcohol abuse, uncomplicated: Secondary | ICD-10-CM

## 2018-01-27 DIAGNOSIS — I161 Hypertensive emergency: Secondary | ICD-10-CM

## 2018-01-27 DIAGNOSIS — I4891 Unspecified atrial fibrillation: Secondary | ICD-10-CM

## 2018-01-27 DIAGNOSIS — I5033 Acute on chronic diastolic (congestive) heart failure: Secondary | ICD-10-CM

## 2018-01-27 DIAGNOSIS — N186 End stage renal disease: Secondary | ICD-10-CM

## 2018-01-27 DIAGNOSIS — I4892 Unspecified atrial flutter: Secondary | ICD-10-CM

## 2018-01-27 DIAGNOSIS — J810 Acute pulmonary edema: Secondary | ICD-10-CM

## 2018-01-27 LAB — BASIC METABOLIC PANEL
Anion gap: 15 (ref 5–15)
BUN: 32 mg/dL — AB (ref 6–20)
CALCIUM: 8.7 mg/dL — AB (ref 8.9–10.3)
CHLORIDE: 93 mmol/L — AB (ref 101–111)
CO2: 28 mmol/L (ref 22–32)
CREATININE: 7.93 mg/dL — AB (ref 0.61–1.24)
GFR calc Af Amer: 7 mL/min — ABNORMAL LOW (ref >60)
GFR, EST NON AFRICAN AMERICAN: 6 mL/min — AB (ref >60)
Glucose, Bld: 89 mg/dL (ref 65–99)
Potassium: 3.8 mmol/L (ref 3.5–5.1)
SODIUM: 136 mmol/L (ref 135–145)

## 2018-01-27 MED ORDER — FOLIC ACID 1 MG PO TABS: 1.0000 mg | ORAL_TABLET | Freq: Every day | ORAL | 0 refills | Status: DC

## 2018-01-27 MED ORDER — ADULT MULTIVITAMIN W/MINERALS CH: 1.0000 | ORAL_TABLET | Freq: Every day | ORAL | 0 refills | Status: DC

## 2018-01-27 MED ORDER — THIAMINE HCL 100 MG PO TABS: 100.0000 mg | ORAL_TABLET | Freq: Every day | ORAL | 0 refills | Status: DC

## 2018-01-27 NOTE — Progress Notes (Signed)
Discharge summary reviewed with patient and patient signed.  Pt daughter notified of discharge and stated her car broke down so he would just take a cab home.  Pt refused to let us call the cab and stated his daughter would.  Nurse called daughter for pt and he stated that he was ready to be picked up.  Patient packed all belongings.  Assisted downstairs by volunteers.   Anson Crofts

## 2018-01-27 NOTE — Discharge Summary (Signed)
Physician Discharge Summary  Kenneth [name] MEQ:[id number] DOB: 07/19/1956 DOA: 01/26/2018  PCP: Kenneth Ruddy, MD  Admit date: 01/26/2018 Discharge date: 01/27/2018  Time spent: 45 minutes  Recommendations for Outpatient Follow-up:  Patient will be discharged to home.  Patient will need to follow up with primary care provider within one week of discharge.  Patient should continue medications as prescribed.  Continue hemodialysis as scheduled. Patient should follow a renal diet with 1252mL fluid restriction per day.  Discharge Diagnoses:  Acute respiratory failure with hypoxia secondary to acute pulmonary edema/acute on chronic diastolic heart failure exacerbation/hypertensive emergency End-stage renal disease Alcohol abuse History of atrial fibrillation/flutter  Discharge Condition: Stable  Diet recommendation: renal diet   Filed Weights   01/26/18 0036 01/26/18 1535 01/27/18 0539  Weight: 93 kg (205 lb) 87 kg (191 lb 12.8 oz) 87.4 kg (192 lb 10.9 oz)    History of present illness:  On 01/26/2018 by Dr. Vanna [name] is a 62 y.o. male with medical history significant of ESRD-HD (TTS), hypertension, alcohol abuse, dCHF, atrial fibrillation not on anticoagulants, who presents with SOB.   Patient has is sleepy, and very reluctant to provide detailed medical history. The history is limited. Pt states that his SOB started yesterday, which has been progressively getting worse. He denies chest pain. He has cough, but no fever or chills. He denies nausea, vomiting, diarrhea, abdominal pain, symptoms of UTI or unilateral weakness. He states that he had dialysis yesterday. Per EMS report, pt was pale, diaphoretic, rales in all fields.   Hospital Course:  Acute respiratory failure with hypoxia secondary to acute pulmonary edema/acute on chronic diastolic heart failure exacerbation/hypertensive emergency -Patient presented with shortness of breath and was found to have pulmonary edema on  chest x-ray -Last echocardiogram 12/02/2017 showing an EF of 29-79%, grade 1 diastolic dysfunction -Upon admission, blood pressure 185/93-blood pressure now well controlled -Nephrology was consulted and appreciated, patient did have over 5-1/2 L removed with dialysis on 01/26/2018 -Patient did require BiPAP on admission however currently maintaining oxygen saturations in the high 90s on room air -Patient no longer complaining of shortness of breath -Given his 5+ liters that were removed on 01/26/2018 with dialysis, patient has improved quicker than expected -Discussed fluid intake and dietary modifications with patient  End-stage renal disease -Nephrology consulted and appreciated -Patient normally dialyzes on Tuesday, Thursday, Saturday -Patient dialyzed on 01/26/2018  Alcohol abuse -placed on CIWA protocol -No evidence of withdrawal -Discussed cessation  History of atrial fibrillation/flutter -CHADSVASC 2 -Currently not on anticoagulation, not sure as to why -Continue metoprolol  Procedures: None  Consultations: Nephrology   Discharge Exam: Vitals:   01/27/18 0444 01/27/18 0827  BP: (!) 144/110 116/82  Pulse: 95 79  Resp:    Temp: 98.9 F (37.2 C)   SpO2: 96%    Patient seen and examined on day of discharge.  Currently feels breathing has improved.  Denies current chest pain, shortness of breath, abdominal pain, nausea vomiting, diarrhea or constipation.   General: Well developed, well nourished, NAD, appears stated age  HEENT: NCAT, mucous membranes moist.  Neck: Supple  Cardiovascular: S1 S2 auscultated, no rubs, murmurs or gallops. Regular rate and rhythm.  Respiratory: Clear to auscultation bilaterally with equal chest rise  Abdomen: Soft, nontender, nondistended, + bowel sounds  Extremities: warm dry without cyanosis clubbing or edema  Neuro: AAOx3, Nonfocal  Psych: Anxious however appropriate  Discharge Instructions Discharge Instructions    Discharge  instructions   Complete by:  As directed    Patient will be discharged to home.  Patient will need to follow up with primary care provider within one week of discharge.  Patient should continue medications as prescribed.  Continue hemodialysis as scheduled. Patient should follow a renal diet with 1263mL fluid restriction per day.     Allergies as of 01/27/2018   No Known Allergies     Medication List    TAKE these medications   acetaminophen 325 MG tablet Commonly known as:  TYLENOL Take 2 tablets (650 mg total) by mouth every 6 (six) hours as needed for mild pain (or Fever >/= 101).   amLODipine 10 MG tablet Commonly known as:  NORVASC Take 10 mg by mouth at bedtime.   diclofenac sodium 1 % Gel Commonly known as:  VOLTAREN Apply 2 g topically 4 (four) times daily. For back pain and spasm   diphenhydramine-acetaminophen 25-500 MG Tabs tablet Commonly known as:  TYLENOL PM Take 1 tablet by mouth at bedtime as needed (for sleep).   folic acid 1 MG tablet Commonly known as:  FOLVITE Take 1 tablet (1 mg total) by mouth daily. Start taking on:  01/28/2018   metoprolol tartrate 25 MG tablet Commonly known as:  LOPRESSOR Take a half tablet (12.5 mg) twice a day. Do not take on mornings of dialysis   multivitamin with minerals Tabs tablet Take 1 tablet by mouth daily. Start taking on:  01/28/2018   PHOSLO 667 MG capsule Generic drug:  calcium acetate Take 1,334-2,001 mg by mouth See admin instructions. 2,001 mg three times a day with meals and 1,334 mg two times a day with snacks   thiamine 100 MG tablet Take 1 tablet (100 mg total) by mouth daily. Start taking on:  01/28/2018      No Known Allergies Follow-up Information    Kenneth Ruddy, MD. Schedule an appointment as soon as possible for a visit.   Specialty:  Family Medicine Why:  Hospital follow up Contact information: [place] [address] [phone]        [name], Kenneth M, MD .     Specialty:  Cardiology Contact information: [address] [place] [address] [phone]            The results of significant diagnostics from this hospitalization (including imaging, microbiology, ancillary and laboratory) are listed below for reference.    Significant Diagnostic Studies: Dg Chest Portable 1 View  Result Date: 01/26/2018 CLINICAL DATA:  Short of breath EXAM: PORTABLE CHEST 1 VIEW COMPARISON:  12/01/2017, 07/28/2017 FINDINGS: Diffuse interstitial and ground-glass opacity. Mild cardiomegaly with vascular congestion. No focal consolidation. No pleural effusion. Aortic atherosclerosis. No pneumothorax. IMPRESSION: 1. Borderline to mild cardiomegaly. Vascular congestion with diffuse interstitial and ground-glass opacity, suspect for pulmonary edema. 2. No focal pulmonary airspace disease.  No pleural effusion. Electronically Signed   By: Donavan Foil M.D.   On: 01/26/2018 00:49    Microbiology: No results found for this or any previous visit (from the past 240 hour(s)).   Labs: Basic Metabolic Panel: Recent Labs  Lab 01/26/18 0040 01/26/18 0143 01/27/18 0225  NA 141 142 136  K 3.9 4.6 3.8  CL 104 106 93*  CO2 21*  --  28  GLUCOSE 123* 98 89  BUN 44* 47* 32*  CREATININE 11.20* 12.50* 7.93*  CALCIUM 9.4  --  8.7*   Liver Function Tests: No results for input(s): AST, ALT, ALKPHOS, BILITOT, PROT, ALBUMIN in the last  168 hours. No results for input(s): LIPASE, AMYLASE in the last 168 hours. No results for input(s): AMMONIA in the last 168 hours. CBC: Recent Labs  Lab 01/26/18 0040 01/26/18 0143  WBC 11.1*  --   HGB 11.4* 11.9*  HCT 34.2* 35.0*  MCV 100.3*  --   PLT 258  --    Cardiac Enzymes: Recent Labs  Lab 01/26/18 0418 01/26/18 0917 01/26/18 1548  TROPONINI 0.05* 0.06* 0.04*   BNP: BNP (last 3 results) Recent Labs    12/01/17 0921 01/26/18 0114  BNP 1,765.6* 1,720.1*    ProBNP (last 3 results) No results for  input(s): PROBNP in the last 8760 hours.  CBG: No results for input(s): GLUCAP in the last 168 hours.     Signed:  Cristal Ford  [facility] 01/27/2018, 11:21 AM

## 2018-01-27 NOTE — Progress Notes (Signed)
[facility] Progress Note  Subjective: 5.5 L off w HD yest, feeling much better, no SOB/ cough, no CP, no fevers, not on O2  Vitals:   01/27/18 0035 01/27/18 0444 01/27/18 0539 01/27/18 0827  BP: 128/70 (!) 144/110  116/82  Pulse:  95  79  Resp:      Temp: 98.7 F (37.1 C) 98.9 F (37.2 C)    TempSrc: Oral Oral    SpO2:  96%    Weight:   87.4 kg (192 lb 10.9 oz)   Height:        Inpatient medications: . amLODipine  10 mg Oral QHS  . aspirin EC  81 mg Oral Daily  . calcium acetate  2,001 mg Oral TID WC  . diclofenac sodium  2 g Topical TID AC & HS  . folic acid  1 mg Oral Daily  . heparin  5,000 Units Subcutaneous Q8H  . LORazepam  0-4 mg Intravenous Q6H   Followed by  . [START ON 01/28/2018] LORazepam  0-4 mg Intravenous Q12H  . metoprolol tartrate  12.5 mg Oral BID  . multivitamin with minerals  1 tablet Oral Daily  . sodium chloride flush  3 mL Intravenous Q12H  . thiamine  100 mg Oral Daily   Or  . thiamine  100 mg Intravenous Daily   . sodium chloride    . nitroGLYCERIN Stopped (01/26/18 1039)   sodium chloride, diphenhydrAMINE **AND** acetaminophen, acetaminophen, calcium acetate, hydrALAZINE, LORazepam **OR** LORazepam, ondansetron (ZOFRAN) IV, sodium chloride flush  Exam: Alert, no distress, no O2 No jvd Chest clear bilat RRR no mrg Abd soft ntnd no ascites Ext no edema LUE AVF+ bruit NF, ox 3  Dialysis: TTS at [facility] 4:15hr, 400/800, EDW 86.5kg, 2K/2Ca, AVF, heparin 2400 bolus - Mircera 73mcg IV q 2 weeks (last 3/28) - Venofer 50mg  IV weekly - Calcitriol 2.65mcg PO q HD   Assessment/Plan: 1.  Dyspnea/pulm edema: 5.5 L off w/ HD yest, down to dry wt almost and breathing back to normal, off of O2 support.  OK for dc today.  2.  ESRD: on HD TTS.  Will resume OP HD tomorrow.  3.  Hypertension/volume: BP's better after HD 4.  Anemia: Hgb 11.9. No ESA for now. 5.  Metabolic bone disease: Ca ok. Continue home meds for now.    Plan - ok for  dc , have d/w primary team.    Kelly Splinter MD [place] pager [phone]   01/27/2018, 11:45 AM   Recent Labs  Lab 01/26/18 0040 01/26/18 0143 01/27/18 0225  NA 141 142 136  K 3.9 4.6 3.8  CL 104 106 93*  CO2 21*  --  28  GLUCOSE 123* 98 89  BUN 44* 47* 32*  CREATININE 11.20* 12.50* 7.93*  CALCIUM 9.4  --  8.7*   No results for input(s): AST, ALT, ALKPHOS, BILITOT, PROT, ALBUMIN in the last 168 hours. Recent Labs  Lab 01/26/18 0040 01/26/18 0143  WBC 11.1*  --   HGB 11.4* 11.9*  HCT 34.2* 35.0*  MCV 100.3*  --   PLT 258  --    Iron/TIBC/Ferritin/ %Sat    Component Value Date/Time   IRON 51 05/22/2016 0414   TIBC 262 05/22/2016 0414   FERRITIN 113 05/22/2016 0414   IRONPCTSAT 19 05/22/2016 0414

## 2018-01-27 NOTE — Discharge Instructions (Signed)
Pulmonary Edema °Pulmonary edema is abnormal fluid buildup in the lungs that can make it hard to breathe. °Follow these instructions at home: °· Talk to your doctor about an exercise program. °· Eat a healthy diet: °? Eat fresh fruits, vegetables, and lean meats. °? Limit high fat and salty foods. °? Avoid processed, canned, or fried foods. °? Avoid fast food. °· Follow your doctor's advice about taking medicine and recording the medicine you take. °· Follow your doctor's advice about keeping a record of your weight. °· Talk to your doctor about keeping track of your blood pressure. °· Do not smoke. °· Do not use nicotine patches or nicotine gum. °· Make a follow-up appointment with your doctor. °· Ask your doctor for a copy of your latest heart tracing (ECG) and keep a copy with you at all times. °Get help right away if: °· You have chest pain. THIS IS AN EMERGENCY. Do not wait to see if the pain will go away. Call for local emergency medical help. Do not drive yourself to the hospital. °· You have sweating, feel sick to your stomach (nauseous), or are experiencing shortness of breath. °· Your weight increases more than your doctor tells you it should. °· You start to have shortness of breath. °· You notice more swelling in your hands, feet, ankles, or belly. °· You have dizziness, blurred vision, headache, or unsteadiness that does not go away. °· You cough up bloody spit. °· You have a cough that does not go away. °· You are unable to sleep because it is hard to breathe. °· You begin to feel a “jumping” or “fluttering” sensation (palpitations) in the chest that is unusual for you. °This information is not intended to replace advice given to you by your health care provider. Make sure you discuss any questions you have with your health care provider. °Document Released: 10/01/2009 Document Revised: 03/20/2016 Document Reviewed: 06/20/2013 °Elsevier Interactive Patient Education © 2018 [employer] ° °

## 2018-01-28 ENCOUNTER — Telehealth: Payer: Self-pay | Admitting: Family Medicine

## 2018-01-28 DIAGNOSIS — D509 Iron deficiency anemia, unspecified: Secondary | ICD-10-CM | POA: Diagnosis not present

## 2018-01-28 DIAGNOSIS — N2581 Secondary hyperparathyroidism of renal origin: Secondary | ICD-10-CM | POA: Diagnosis not present

## 2018-01-28 DIAGNOSIS — N186 End stage renal disease: Secondary | ICD-10-CM | POA: Diagnosis not present

## 2018-01-28 NOTE — Telephone Encounter (Signed)
I left a message for pt to return my call.  

## 2018-01-29 NOTE — Telephone Encounter (Signed)
Transition Care Management Follow-up Telephone Call  Kenneth [name] VXY:[id number] DOB: 03-19-1956 DOA: 01/26/2018  PCP: [name], MD  Admit date: 01/26/2018 Discharge date: 01/27/2018  Time spent: 45 minutes  Recommendations for Outpatient Follow-up:  Patient will be discharged to home.  Patient will need to follow up with primary care provider within one week of discharge.  Patient should continue medications as prescribed.  Continue hemodialysis as scheduled. Patient should follow a renal diet with 1218mL fluid restriction per day.  Discharge Diagnoses:  Acute respiratory failure with hypoxia secondary to acute pulmonary edema/acute on chronic diastolic heart failure exacerbation/hypertensive emergency End-stage renal disease Alcohol abuse History of atrial fibrillation/flutter  Discharge Condition: Stable  Diet recommendation: renal diet     How have you been since you were released from the hospital? "better"   Do you understand why you were in the hospital? yes   Do you understand the discharge instructions? yes   Where were you discharged to? Home  Items Reviewed:  Medications reviewed: yes  Allergies reviewed: yes  Dietary changes reviewed: yes  Referrals reviewed: yes   Functional Questionnaire:   Activities of Daily Living (ADLs):   He states they are independent in the following: ambulation, bathing and hygiene, feeding, continence, grooming, toileting and dressing States they require assistance with the following: none   Any transportation issues/concerns?: no   Any patient concerns? no   Confirmed importance and date/time of follow-up visits scheduled yes  Provider Appointment booked with Dr. Volanda Napoleon on 02/03/2018 Wednesday at 10:00 am  Confirmed with patient if condition begins to worsen call PCP or go to the ER.  Patient was given the office number and encouraged to call back with question or concerns.  : yes

## 2018-01-30 DIAGNOSIS — N2581 Secondary hyperparathyroidism of renal origin: Secondary | ICD-10-CM | POA: Diagnosis not present

## 2018-01-30 DIAGNOSIS — D509 Iron deficiency anemia, unspecified: Secondary | ICD-10-CM | POA: Diagnosis not present

## 2018-01-30 DIAGNOSIS — N186 End stage renal disease: Secondary | ICD-10-CM | POA: Diagnosis not present

## 2018-02-02 DIAGNOSIS — N2581 Secondary hyperparathyroidism of renal origin: Secondary | ICD-10-CM | POA: Diagnosis not present

## 2018-02-02 DIAGNOSIS — N186 End stage renal disease: Secondary | ICD-10-CM | POA: Diagnosis not present

## 2018-02-02 DIAGNOSIS — D509 Iron deficiency anemia, unspecified: Secondary | ICD-10-CM | POA: Diagnosis not present

## 2018-02-03 ENCOUNTER — Inpatient Hospital Stay: Payer: Medicare Other | Admitting: Family Medicine

## 2018-02-03 DIAGNOSIS — Z0289 Encounter for other administrative examinations: Secondary | ICD-10-CM

## 2018-02-04 DIAGNOSIS — N186 End stage renal disease: Secondary | ICD-10-CM | POA: Diagnosis not present

## 2018-02-04 DIAGNOSIS — N2581 Secondary hyperparathyroidism of renal origin: Secondary | ICD-10-CM | POA: Diagnosis not present

## 2018-02-04 DIAGNOSIS — D509 Iron deficiency anemia, unspecified: Secondary | ICD-10-CM | POA: Diagnosis not present

## 2018-02-06 DIAGNOSIS — N186 End stage renal disease: Secondary | ICD-10-CM | POA: Diagnosis not present

## 2018-02-06 DIAGNOSIS — N2581 Secondary hyperparathyroidism of renal origin: Secondary | ICD-10-CM | POA: Diagnosis not present

## 2018-02-06 DIAGNOSIS — D509 Iron deficiency anemia, unspecified: Secondary | ICD-10-CM | POA: Diagnosis not present

## 2018-02-08 ENCOUNTER — Other Ambulatory Visit: Payer: Self-pay | Admitting: Internal Medicine

## 2018-02-09 DIAGNOSIS — D509 Iron deficiency anemia, unspecified: Secondary | ICD-10-CM | POA: Diagnosis not present

## 2018-02-09 DIAGNOSIS — N2581 Secondary hyperparathyroidism of renal origin: Secondary | ICD-10-CM | POA: Diagnosis not present

## 2018-02-09 DIAGNOSIS — N186 End stage renal disease: Secondary | ICD-10-CM | POA: Diagnosis not present

## 2018-02-11 DIAGNOSIS — N186 End stage renal disease: Secondary | ICD-10-CM | POA: Diagnosis not present

## 2018-02-11 DIAGNOSIS — N2581 Secondary hyperparathyroidism of renal origin: Secondary | ICD-10-CM | POA: Diagnosis not present

## 2018-02-11 DIAGNOSIS — D509 Iron deficiency anemia, unspecified: Secondary | ICD-10-CM | POA: Diagnosis not present

## 2018-02-13 DIAGNOSIS — D509 Iron deficiency anemia, unspecified: Secondary | ICD-10-CM | POA: Diagnosis not present

## 2018-02-13 DIAGNOSIS — N186 End stage renal disease: Secondary | ICD-10-CM | POA: Diagnosis not present

## 2018-02-13 DIAGNOSIS — N2581 Secondary hyperparathyroidism of renal origin: Secondary | ICD-10-CM | POA: Diagnosis not present

## 2018-02-16 ENCOUNTER — Inpatient Hospital Stay (HOSPITAL_COMMUNITY)
Admission: EM | Admit: 2018-02-16 | Discharge: 2018-02-19 | DRG: 189 | Disposition: A | Discharge status: Home Health | Payer: Medicare Other | Attending: Family Medicine | Admitting: Family Medicine

## 2018-02-16 ENCOUNTER — Other Ambulatory Visit: Payer: Self-pay

## 2018-02-16 ENCOUNTER — Encounter (HOSPITAL_COMMUNITY): Payer: Self-pay | Admitting: Emergency Medicine

## 2018-02-16 ENCOUNTER — Emergency Department (HOSPITAL_COMMUNITY): Payer: Medicare Other

## 2018-02-16 DIAGNOSIS — I953 Hypotension of hemodialysis: Secondary | ICD-10-CM | POA: Diagnosis not present

## 2018-02-16 DIAGNOSIS — Z992 Dependence on renal dialysis: Secondary | ICD-10-CM

## 2018-02-16 DIAGNOSIS — I509 Heart failure, unspecified: Secondary | ICD-10-CM | POA: Diagnosis not present

## 2018-02-16 DIAGNOSIS — E8779 Other fluid overload: Secondary | ICD-10-CM | POA: Diagnosis present

## 2018-02-16 DIAGNOSIS — N2581 Secondary hyperparathyroidism of renal origin: Secondary | ICD-10-CM | POA: Diagnosis not present

## 2018-02-16 DIAGNOSIS — F1020 Alcohol dependence, uncomplicated: Secondary | ICD-10-CM | POA: Diagnosis present

## 2018-02-16 DIAGNOSIS — R030 Elevated blood-pressure reading, without diagnosis of hypertension: Secondary | ICD-10-CM | POA: Diagnosis not present

## 2018-02-16 DIAGNOSIS — F17290 Nicotine dependence, other tobacco product, uncomplicated: Secondary | ICD-10-CM | POA: Diagnosis present

## 2018-02-16 DIAGNOSIS — I120 Hypertensive chronic kidney disease with stage 5 chronic kidney disease or end stage renal disease: Secondary | ICD-10-CM | POA: Diagnosis not present

## 2018-02-16 DIAGNOSIS — F17200 Nicotine dependence, unspecified, uncomplicated: Secondary | ICD-10-CM | POA: Diagnosis not present

## 2018-02-16 DIAGNOSIS — F17210 Nicotine dependence, cigarettes, uncomplicated: Secondary | ICD-10-CM | POA: Diagnosis present

## 2018-02-16 DIAGNOSIS — I10 Essential (primary) hypertension: Secondary | ICD-10-CM | POA: Diagnosis not present

## 2018-02-16 DIAGNOSIS — I132 Hypertensive heart and chronic kidney disease with heart failure and with stage 5 chronic kidney disease, or end stage renal disease: Secondary | ICD-10-CM | POA: Diagnosis not present

## 2018-02-16 DIAGNOSIS — R0602 Shortness of breath: Secondary | ICD-10-CM | POA: Diagnosis not present

## 2018-02-16 DIAGNOSIS — J9621 Acute and chronic respiratory failure with hypoxia: Secondary | ICD-10-CM | POA: Diagnosis present

## 2018-02-16 DIAGNOSIS — D649 Anemia, unspecified: Secondary | ICD-10-CM | POA: Diagnosis present

## 2018-02-16 DIAGNOSIS — M898X9 Other specified disorders of bone, unspecified site: Secondary | ICD-10-CM | POA: Diagnosis present

## 2018-02-16 DIAGNOSIS — N186 End stage renal disease: Secondary | ICD-10-CM | POA: Diagnosis not present

## 2018-02-16 DIAGNOSIS — F0390 Unspecified dementia without behavioral disturbance: Secondary | ICD-10-CM | POA: Diagnosis present

## 2018-02-16 DIAGNOSIS — J810 Acute pulmonary edema: Principal | ICD-10-CM | POA: Diagnosis present

## 2018-02-16 DIAGNOSIS — E8770 Fluid overload, unspecified: Secondary | ICD-10-CM | POA: Diagnosis not present

## 2018-02-16 DIAGNOSIS — I5043 Acute on chronic combined systolic (congestive) and diastolic (congestive) heart failure: Secondary | ICD-10-CM | POA: Diagnosis present

## 2018-02-16 DIAGNOSIS — J9620 Acute and chronic respiratory failure, unspecified whether with hypoxia or hypercapnia: Secondary | ICD-10-CM | POA: Diagnosis present

## 2018-02-16 DIAGNOSIS — I4891 Unspecified atrial fibrillation: Secondary | ICD-10-CM | POA: Diagnosis present

## 2018-02-16 DIAGNOSIS — I428 Other cardiomyopathies: Secondary | ICD-10-CM | POA: Diagnosis present

## 2018-02-16 DIAGNOSIS — D631 Anemia in chronic kidney disease: Secondary | ICD-10-CM | POA: Diagnosis not present

## 2018-02-16 DIAGNOSIS — I5032 Chronic diastolic (congestive) heart failure: Secondary | ICD-10-CM | POA: Diagnosis present

## 2018-02-16 DIAGNOSIS — F1010 Alcohol abuse, uncomplicated: Secondary | ICD-10-CM | POA: Diagnosis not present

## 2018-02-16 DIAGNOSIS — I5042 Chronic combined systolic (congestive) and diastolic (congestive) heart failure: Secondary | ICD-10-CM | POA: Diagnosis not present

## 2018-02-16 HISTORY — DX: Dependence on renal dialysis: Z99.2

## 2018-02-16 HISTORY — DX: End stage renal disease: N18.6

## 2018-02-16 LAB — CBC
HCT: 31.1 % — ABNORMAL LOW (ref 39.0–52.0)
HEMATOCRIT: 30.8 % — AB (ref 39.0–52.0)
Hemoglobin: 10.6 g/dL — ABNORMAL LOW (ref 13.0–17.0)
Hemoglobin: 10.8 g/dL — ABNORMAL LOW (ref 13.0–17.0)
MCH: 32.9 pg (ref 26.0–34.0)
MCH: 33.4 pg (ref 26.0–34.0)
MCHC: 34.1 g/dL (ref 30.0–36.0)
MCHC: 35.1 g/dL (ref 30.0–36.0)
MCV: 96.6 fL (ref 78.0–100.0)
MCV: 95.4 fL (ref 78.0–100.0)
Platelets: 240 10*3/uL (ref 150–400)
Platelets: 210 10*3/uL (ref 150–400)
RBC: 3.22 MIL/uL — ABNORMAL LOW (ref 4.22–5.81)
RBC: 3.23 MIL/uL — AB (ref 4.22–5.81)
RDW: 14.4 % (ref 11.5–15.5)
RDW: 14.2 % (ref 11.5–15.5)
WBC: 8.8 10*3/uL (ref 4.0–10.5)
WBC: 7.1 10*3/uL (ref 4.0–10.5)

## 2018-02-16 LAB — BASIC METABOLIC PANEL
Anion gap: 16 — ABNORMAL HIGH (ref 5–15)
BUN: 48 mg/dL — ABNORMAL HIGH (ref 6–20)
CO2: 20 mmol/L — ABNORMAL LOW (ref 22–32)
Calcium: 9.2 mg/dL (ref 8.9–10.3)
Chloride: 102 mmol/L (ref 101–111)
Creatinine, Ser: 12.02 mg/dL — ABNORMAL HIGH (ref 0.61–1.24)
GFR calc Af Amer: 4 mL/min — ABNORMAL LOW (ref >60)
GFR calc non Af Amer: 4 mL/min — ABNORMAL LOW (ref >60)
Glucose, Bld: 89 mg/dL (ref 65–99)
Potassium: 4.7 mmol/L (ref 3.5–5.1)
Sodium: 138 mmol/L (ref 135–145)

## 2018-02-16 LAB — RENAL FUNCTION PANEL
ALBUMIN: 3.2 g/dL — AB (ref 3.5–5.0)
Anion gap: 16 — ABNORMAL HIGH (ref 5–15)
BUN: 21 mg/dL — AB (ref 6–20)
CHLORIDE: 95 mmol/L — AB (ref 101–111)
CO2: 26 mmol/L (ref 22–32)
Calcium: 8.6 mg/dL — ABNORMAL LOW (ref 8.9–10.3)
Creatinine, Ser: 6.74 mg/dL — ABNORMAL HIGH (ref 0.61–1.24)
GFR calc Af Amer: 9 mL/min — ABNORMAL LOW (ref >60)
GFR, EST NON AFRICAN AMERICAN: 8 mL/min — AB (ref >60)
Glucose, Bld: 159 mg/dL — ABNORMAL HIGH (ref 65–99)
PHOSPHORUS: 4.9 mg/dL — AB (ref 2.5–4.6)
POTASSIUM: 3.5 mmol/L (ref 3.5–5.1)
Sodium: 137 mmol/L (ref 135–145)

## 2018-02-16 LAB — BRAIN NATRIURETIC PEPTIDE: B Natriuretic Peptide: 1594.1 pg/mL — ABNORMAL HIGH (ref 0.0–100.0)

## 2018-02-16 LAB — MRSA PCR SCREENING: MRSA by PCR: NEGATIVE

## 2018-02-16 LAB — I-STAT TROPONIN, ED: Troponin i, poc: 0.06 ng/mL (ref 0.00–0.08)

## 2018-02-16 MED ORDER — CAMPHOR-MENTHOL 0.5-0.5 % EX LOTN
1.0000 "application " | TOPICAL_LOTION | Freq: Three times a day (TID) | CUTANEOUS | Status: DC | PRN
  Filled 2018-02-16: qty 222

## 2018-02-16 MED ORDER — LIDOCAINE-PRILOCAINE 2.5-2.5 % EX CREA: 1.0000 "application " | TOPICAL_CREAM | CUTANEOUS | Status: DC | PRN

## 2018-02-16 MED ORDER — ONDANSETRON HCL 4 MG/2ML IJ SOLN: 4.0000 mg | Freq: Four times a day (QID) | INTRAMUSCULAR | Status: DC | PRN

## 2018-02-16 MED ORDER — CALCIUM ACETATE (PHOS BINDER) 667 MG PO CAPS
2001.0000 mg | ORAL_CAPSULE | Freq: Three times a day (TID) | ORAL | Status: DC
  Administered 2018-02-17 – 2018-02-19 (×6): 2001 mg via ORAL
  Filled 2018-02-16 (×6): qty 3

## 2018-02-16 MED ORDER — SODIUM CHLORIDE 0.9 % IV SOLN: 100.0000 mL | INTRAVENOUS | Status: DC | PRN

## 2018-02-16 MED ORDER — THIAMINE HCL 100 MG/ML IJ SOLN
100.0000 mg | Freq: Every day | INTRAMUSCULAR | Status: DC
  Filled 2018-02-16: qty 2

## 2018-02-16 MED ORDER — SORBITOL 70 % SOLN
30.0000 mL | Status: DC | PRN
  Administered 2018-02-19: 30 mL via ORAL
  Filled 2018-02-16: qty 30

## 2018-02-16 MED ORDER — SODIUM CHLORIDE 0.9 % IV SOLN: 250.0000 mL | INTRAVENOUS | Status: DC | PRN

## 2018-02-16 MED ORDER — ASPIRIN EC 81 MG PO TBEC
81.0000 mg | DELAYED_RELEASE_TABLET | Freq: Every day | ORAL | Status: DC
  Administered 2018-02-17 – 2018-02-19 (×3): 81 mg via ORAL
  Filled 2018-02-16 (×3): qty 1

## 2018-02-16 MED ORDER — CALCIUM ACETATE (PHOS BINDER) 667 MG PO CAPS: 1334.0000 mg | ORAL_CAPSULE | Freq: Two times a day (BID) | ORAL | Status: DC | PRN

## 2018-02-16 MED ORDER — LIDOCAINE-PRILOCAINE 2.5-2.5 % EX CREA
1.0000 "application " | TOPICAL_CREAM | CUTANEOUS | Status: DC | PRN
  Filled 2018-02-16: qty 5

## 2018-02-16 MED ORDER — NEPRO/CARBSTEADY PO LIQD: 237.0000 mL | Freq: Three times a day (TID) | ORAL | Status: DC | PRN

## 2018-02-16 MED ORDER — ACETAMINOPHEN 325 MG PO TABS: 650.0000 mg | ORAL_TABLET | Freq: Four times a day (QID) | ORAL | Status: DC | PRN

## 2018-02-16 MED ORDER — ACETAMINOPHEN 325 MG PO TABS: 650.0000 mg | ORAL_TABLET | ORAL | Status: DC | PRN

## 2018-02-16 MED ORDER — HEPARIN SODIUM (PORCINE) 1000 UNIT/ML DIALYSIS: 1000.0000 [IU] | INTRAMUSCULAR | Status: DC | PRN

## 2018-02-16 MED ORDER — CALCIUM CARBONATE ANTACID 1250 MG/5ML PO SUSP
500.0000 mg | Freq: Four times a day (QID) | ORAL | Status: DC | PRN
  Filled 2018-02-16: qty 5

## 2018-02-16 MED ORDER — SODIUM CHLORIDE 0.9% FLUSH
3.0000 mL | Freq: Two times a day (BID) | INTRAVENOUS | Status: DC
  Administered 2018-02-16 – 2018-02-19 (×5): 3 mL via INTRAVENOUS

## 2018-02-16 MED ORDER — PENTAFLUOROPROP-TETRAFLUOROETH EX AERO: 1.0000 "application " | INHALATION_SPRAY | CUTANEOUS | Status: DC | PRN

## 2018-02-16 MED ORDER — AMLODIPINE BESYLATE 10 MG PO TABS
10.0000 mg | ORAL_TABLET | Freq: Every day | ORAL | Status: DC
  Administered 2018-02-16 – 2018-02-18 (×3): 10 mg via ORAL
  Filled 2018-02-16 (×3): qty 1

## 2018-02-16 MED ORDER — ENOXAPARIN SODIUM 30 MG/0.3ML ~~LOC~~ SOLN
30.0000 mg | SUBCUTANEOUS | Status: DC
  Administered 2018-02-16 – 2018-02-18 (×3): 30 mg via SUBCUTANEOUS
  Filled 2018-02-16 (×3): qty 0.3

## 2018-02-16 MED ORDER — ALTEPLASE 2 MG IJ SOLR: 2.0000 mg | Freq: Once | INTRAMUSCULAR | Status: DC | PRN

## 2018-02-16 MED ORDER — LORAZEPAM 2 MG/ML IJ SOLN: 1.0000 mg | Freq: Four times a day (QID) | INTRAMUSCULAR | Status: DC | PRN

## 2018-02-16 MED ORDER — ADULT MULTIVITAMIN W/MINERALS CH
1.0000 | ORAL_TABLET | Freq: Every day | ORAL | Status: DC
  Administered 2018-02-17 – 2018-02-19 (×3): 1 via ORAL
  Filled 2018-02-16 (×3): qty 1

## 2018-02-16 MED ORDER — LIDOCAINE HCL (PF) 1 % IJ SOLN: 5.0000 mL | INTRAMUSCULAR | Status: DC | PRN

## 2018-02-16 MED ORDER — VITAMIN B-1 100 MG PO TABS
100.0000 mg | ORAL_TABLET | Freq: Every day | ORAL | Status: DC
  Administered 2018-02-17 – 2018-02-19 (×3): 100 mg via ORAL
  Filled 2018-02-16 (×3): qty 1

## 2018-02-16 MED ORDER — PENTAFLUOROPROP-TETRAFLUOROETH EX AERO
1.0000 "application " | INHALATION_SPRAY | CUTANEOUS | Status: DC | PRN
  Filled 2018-02-16: qty 30

## 2018-02-16 MED ORDER — HYDROXYZINE HCL 25 MG PO TABS
25.0000 mg | ORAL_TABLET | Freq: Three times a day (TID) | ORAL | Status: DC | PRN
  Administered 2018-02-17: 25 mg via ORAL
  Filled 2018-02-16: qty 1

## 2018-02-16 MED ORDER — HEPARIN SODIUM (PORCINE) 1000 UNIT/ML DIALYSIS
1000.0000 [IU] | INTRAMUSCULAR | Status: DC | PRN
  Filled 2018-02-16: qty 1

## 2018-02-16 MED ORDER — LORAZEPAM 1 MG PO TABS
1.0000 mg | ORAL_TABLET | Freq: Four times a day (QID) | ORAL | Status: DC | PRN
  Administered 2018-02-16: 1 mg via ORAL
  Filled 2018-02-16: qty 1

## 2018-02-16 MED ORDER — SODIUM CHLORIDE 0.9% FLUSH: 3.0000 mL | INTRAVENOUS | Status: DC | PRN

## 2018-02-16 MED ORDER — ALBUTEROL (5 MG/ML) CONTINUOUS INHALATION SOLN
10.0000 mg/h | INHALATION_SOLUTION | Freq: Once | RESPIRATORY_TRACT | Status: AC
  Administered 2018-02-16: 10 mg/h via RESPIRATORY_TRACT
  Filled 2018-02-16: qty 20

## 2018-02-16 MED ORDER — ONDANSETRON HCL 4 MG PO TABS
4.0000 mg | ORAL_TABLET | Freq: Four times a day (QID) | ORAL | Status: DC | PRN
Start: 2018-02-16 — End: 2018-02-19

## 2018-02-16 MED ORDER — HEPARIN SODIUM (PORCINE) 1000 UNIT/ML DIALYSIS
20.0000 [IU]/kg | INTRAMUSCULAR | Status: DC | PRN
  Filled 2018-02-16: qty 2

## 2018-02-16 MED ORDER — LISINOPRIL 2.5 MG PO TABS
2.5000 mg | ORAL_TABLET | Freq: Every day | ORAL | Status: DC
  Administered 2018-02-17 – 2018-02-19 (×3): 2.5 mg via ORAL
  Filled 2018-02-16 (×4): qty 1

## 2018-02-16 MED ORDER — ZOLPIDEM TARTRATE 5 MG PO TABS: 5.0000 mg | ORAL_TABLET | Freq: Every evening | ORAL | Status: DC | PRN

## 2018-02-16 MED ORDER — FOLIC ACID 1 MG PO TABS
1.0000 mg | ORAL_TABLET | Freq: Every day | ORAL | Status: DC
  Administered 2018-02-17 – 2018-02-19 (×3): 1 mg via ORAL
  Filled 2018-02-16 (×3): qty 1

## 2018-02-16 MED ORDER — METOPROLOL TARTRATE 12.5 MG HALF TABLET
12.5000 mg | ORAL_TABLET | Freq: Two times a day (BID) | ORAL | Status: DC
  Administered 2018-02-16 – 2018-02-19 (×5): 12.5 mg via ORAL
  Filled 2018-02-16 (×6): qty 1

## 2018-02-16 MED ORDER — NICOTINE 14 MG/24HR TD PT24
14.0000 mg | MEDICATED_PATCH | Freq: Every day | TRANSDERMAL | Status: DC
  Administered 2018-02-17 – 2018-02-19 (×3): 14 mg via TRANSDERMAL
  Filled 2018-02-16 (×3): qty 1

## 2018-02-16 MED ORDER — DOCUSATE SODIUM 283 MG RE ENEM
1.0000 | ENEMA | RECTAL | Status: DC | PRN
  Filled 2018-02-16: qty 1

## 2018-02-16 MED ORDER — HEPARIN SODIUM (PORCINE) 1000 UNIT/ML DIALYSIS: 20.0000 [IU]/kg | INTRAMUSCULAR | Status: DC | PRN

## 2018-02-16 MED ORDER — ACETAMINOPHEN 650 MG RE SUPP: 650.0000 mg | Freq: Four times a day (QID) | RECTAL | Status: DC | PRN

## 2018-02-16 NOTE — H&P (Signed)
History and Physical    Kenneth [name] NUU:[id number] DOB: 06/18/1956 DOA: 02/16/2018  PCP: [name], MD Consultants:  Nephrology Patient coming from:  Home - lives alone; [facility]: Daughter, [phone]  Chief Complaint:  SOB  HPI: Kenneth [name] is a 62 year old male with PMH significant for HTN; ESRD on TTS HD; alcohol dependence; and chronic combined CHF presenting with acute onset of SOB.  Acute onset of SOB about 2am Monday.  +cough, productive of yellowish sputum.  No fevers.  Further history limited by patient's use of BIPAP.  He was also admitted 4/2-3 for acute hypoxic respiratory failure secondary to acute pulmonary edema and hypertensive emergency; he also required BIPAP during that admission, and he had 5+L removed from HD on 4/2 with rapid improvement in symptoms.   ED Course:  ESRD on HD - normal session Saturday but acute SOB overnight and placed on BIPAP in ER.  Recent admission for similar.  Not at his usual dry weight despite taking off 5L+ on 4/2 and usual HD Saturday so nephrology will attempt to take off more this time.  May need serial HD.   Review of Systems: As per HPI; otherwise review of systems reviewed and negative.   Ambulatory Status:  Ambulates without assistance  Past Medical History:  Diagnosis Date  . Acute CHF ([place]) 01/2018  . Cardiomyopathy secondary    likely related to HTN heart disease; possibly ETOH related as well  . Chronic combined systolic and diastolic heart failure (HCC)    Echocardiogram 09/22/11: Moderate LVH, EF 87-56%, grade 3 diastolic dysfunction, mild MR, moderate to severe LAE, mild RVE, mild to moderate TR, small to moderate pericardial effusion  . ESRD (end stage renal disease) on dialysis [facility])    due to hypertensive nephrosclerosis  . History of alcohol abuse   . Hypertension     Past Surgical History:  Procedure Laterality Date  . AV FISTULA PLACEMENT Left 05/14/2016   Procedure: LEFT ARM BASILIC VEIN TRANSPOSITION;   Surgeon: Rosetta Posner, MD;  Location: [place];  Service: Vascular;  Laterality: Left;  . PERIPHERAL VASCULAR CATHETERIZATION N/A 05/13/2016   Procedure: Dialysis/Perma Catheter Insertion;  Surgeon: Serafina Mitchell, MD;  Location: [place] [facility];  Service: Cardiovascular;  Laterality: N/A;    Social History   Socioeconomic History  . Marital status: Married    Spouse name: Not on file  . Number of children: Not on file  . Years of education: Not on file  . Highest education level: Not on file  Occupational History  . Not on file  Social Needs  . Financial resource strain: Not on file  . Food insecurity:    Worry: Not on file    Inability: Not on file  . Transportation needs:    Medical: Not on file    Non-medical: Not on file  Tobacco Use  . Smoking status: Current Every Day Smoker    Types: Cigars  . Smokeless tobacco: Former Network engineer and Sexual Activity  . Alcohol use: Yes    Alcohol/week: 18.0 oz    Types: 30 Cans of beer per week    Frequency: Never    Comment: drinks daily  . Drug use: No  . Sexual activity: Not on file  Lifestyle  . Physical activity:    Days per week: Not on file    Minutes per session: Not on file  . Stress: Not on file  Relationships  . Social connections:  Talks on phone: Not on file    Gets together: Not on file    Attends religious service: Not on file    Active member of club or organization: Not on file    Attends meetings of clubs or organizations: Not on file    Relationship status: Not on file  . Intimate partner violence:    Fear of current or ex partner: Not on file    Emotionally abused: Not on file    Physically abused: Not on file    Forced sexual activity: Not on file  Other Topics Concern  . Not on file  Social History Narrative  . Not on file    No Known Allergies  Family History  Problem Relation Age of Onset  . Emphysema Mother   . Cirrhosis Father     Prior to Admission medications   Medication  Sig Start Date End Date Taking? Authorizing Provider  amLODipine (NORVASC) 10 MG tablet Take 10 mg by mouth at bedtime.   Yes [provider]  calcium acetate (PHOSLO) 667 MG capsule Take 1,334-2,001 mg by mouth See admin instructions. 2,001 mg three times a day with meals and 1,334 mg two times a day with snacks   Yes [provider]  diphenhydramine-acetaminophen (TYLENOL PM) 25-500 MG TABS tablet Take 1 tablet by mouth at bedtime as needed (for sleep).   Yes [provider]  metoprolol tartrate (LOPRESSOR) 25 MG tablet Take a half tablet (12.5 mg) twice a day. Do not take on mornings of dialysis Patient taking differently: Take 12.5 mg by mouth See admin instructions. Take a half tablet (12.5 mg) twice a day. Do not take on mornings of dialysis 12/04/17  Yes Tawny Asal, MD  acetaminophen (TYLENOL) 325 MG tablet Take 2 tablets (650 mg total) by mouth every 6 (six) hours as needed for mild pain (or Fever >/= 101). Patient not taking: Reported on 01/29/2018 10/11/16   Domenic Polite, MD  diclofenac sodium (VOLTAREN) 1 % GEL Apply 2 g topically 4 (four) times daily. For back pain and spasm Patient not taking: Reported on 02/16/2018 12/04/17   Tawny Asal, MD  folic acid (FOLVITE) 1 MG tablet Take 1 tablet (1 mg total) by mouth daily. Patient not taking: Reported on 02/16/2018 01/28/18   Cristal Ford, DO  Multiple Vitamin (MULTIVITAMIN WITH MINERALS) TABS tablet Take 1 tablet by mouth daily. Patient not taking: Reported on 02/16/2018 01/28/18   Cristal Ford, DO  thiamine 100 MG tablet Take 1 tablet (100 mg total) by mouth daily. Patient not taking: Reported on 02/16/2018 01/28/18   Cristal Ford, DO    Physical Exam: Vitals:   02/16/18 0900 02/16/18 0915 02/16/18 0930 02/16/18 0945  BP: (!) 160/85 (!) 159/75 (!) 160/74 (!) 175/81  Pulse: 77 87 76 86  Resp: 16 18 16 18   Temp:      TempSrc:      SpO2: 96% 96% 98% 98%  Weight:      Height:         General:   Appears somnolent but comfortable on BIPAP Eyes:  PERRL, EOMI, normal lids, iris ENT:  grossly normal hearing, BIPAP in place Neck:  no LAD, masses or thyromegaly Cardiovascular:  RRR, no m/r/g. No LE edema.  Respiratory:   CTA bilaterally with no wheezes/rales/rhonchi.  Normal respiratory effort. Abdomen:  soft, NT, ND, NABS Skin:  no rash or induration seen on limited exam Musculoskeletal:  grossly normal tone BUE/BLE, good ROM, no bony abnormality Lower  extremity:  No LE edema.  Limited foot exam with no ulcerations.  2+ distal pulses. Psychiatric:  Somnolent, not overly talkative while on BIPAP, speech fluent and appropriate, AOx3 Neurologic:  CN 2-12 grossly intact, moves all extremities in coordinated fashion, sensation intact    Radiological Exams on Admission: Dg Chest Portable 1 View  Result Date: 02/16/2018 CLINICAL DATA:  2 hours of shortness of breath, wheezing, and dyspnea. History of dialysis dependent renal failure, CHF. EXAM: PORTABLE CHEST 1 VIEW COMPARISON:  Portable chest x-ray of January 26, 2018 FINDINGS: The lungs are well-expanded. The interstitial markings are mildly increased diffusely. This is not as significant as that seen on the previous study. The cardiac silhouette remains enlarged. The pulmonary vascularity is engorged. There is no pleural effusion. IMPRESSION: CHF with mild pulmonary interstitial edema.  No alveolar pneumonia. Electronically Signed   By: David  [name] M.D.   On: 02/16/2018 07:31    EKG: Independently reviewed.  NSR with rate 82; nonspecific ST changes with no evidence of acute ischemia; NSCSLT   Labs on Admission: I have personally reviewed the available labs and imaging studies at the time of the admission.  Pertinent labs:   BNP 1594.1 Troponin 0.06 CO2 20 BUN 48/Creatinine 12.02/GFR 4 Hgb 10.6 Echo 12/02/17: preserved EF, grade 1 diastolic dysfunction  Assessment/Plan Principal Problem:   Acute on chronic respiratory failure  (HCC) Active Problems:   Chronic combined systolic and diastolic CHF (congestive heart failure) (HCC)   Essential hypertension   Alcohol abuse   ESRD on hemodialysis (HCC)   Tobacco dependence   Acute on chronic respiratory failure -Patient presenting with recurrent acute on chronic respiratory failure -He had a very similar episode at the beginning of the month which resolved quickly after HD with increased volume pulled off -Per nephrology, the patient is over his dry weight -This may simply be volume overload in the setting of ESRD but he may also have a component of CHF -He is on BIPAP for now and so will be admitted to SDU  ESRD -Patient on chronic TTS HD -Nephrology prn order set utilized -He is volume overloaded and in need of acute HD -Nephrology has been consulted and has placed HD orders for the patient to be dialyzed today  Chronic CHF -Patient with worsening SOB and hypoxia with provductive cough -CXR consistent with pulmonary edema -Elevated BNP -As noted above, patient with ESRD and so this may be that patient needs HD with additional fluid drawn off to meet his usual dry weight; however, acute on chronic CHF exacerbation is also a consideration -Will not repeat echocardiogram since this was done in 2/19; at that time, his EF was preserved but in 7/17 his EF was depressed at 25-30%. -Will start ASA -Will start Lisinopril 2.5 mg daily starting tomorrow -Continue beta blocker -CHF order set utilized -Lasix is unlikely to be beneficial -Continue BIPAP for now -Initial troponin negative  HTN -Continue Norvasc and Lorpressor -Add low-dose Lisinopril  ETOH dependence -Patient with chronic ETOH dependence -He is at increased risk for complications of withdrawal including seizures, DTs -Will observe -CIWA protocol  Tobacco dependence -Encourage cessation.  This was discussed with the patient and should be reviewed on an ongoing basis.   -Patch ordered at patient  request.   DVT prophylaxis:  Lovenox o Code Status: Full - confirmed with patient - of note, he reports that he has never thought about this issue before.  Would encourage ongoing counseling prior to d/c, once the patient is  feeling better. Family Communication: None present  Disposition Plan:  Home once clinically improved Consults called: Nephrology  Admission status: ,Admit - It is my clinical opinion that admission to INPATIENT is reasonable and necessary because of the expectation that this patient will require hospital care that crosses at least 2 midnights to treat this condition based on the medical complexity of the problems presented.  Given the aforementioned information, the predictability of an adverse outcome is felt to be significant.    Karmen Bongo MD [facility]  If note is complete, please contact covering daytime or nighttime physician. [email or web address] Password [facility]  02/16/2018, 10:56 AM

## 2018-02-16 NOTE — Progress Notes (Signed)
Patient admitted to [place] [facility] from Hemodialysis via stretcher.  Bed in low position, wheels locked.  Patient denies chest pain/shortness of breath.  Portable telemetry monitor applied.  Patient oriented to environment, including call bell, TV, meal times, and hourly rounding.  Patient arrived on room air, sats >95%.

## 2018-02-16 NOTE — ED Notes (Signed)
RT called to place pt on Bipap 

## 2018-02-16 NOTE — Procedures (Signed)
Sleeping on HD. BP elevated. Remains on BiPAP. Lg goal. Will need to work on an acceptable treatment regimen. Estanislado Emms, MD

## 2018-02-16 NOTE — ED Triage Notes (Signed)
BIB EMS from home, reports SOB X2 hrs, pt due for dialysis today. T,Th,S - did go on Saturday. Received 10 albuterol, 0.5 atrovent, 125 Solumedrol en route. Pt had labored breathing, accessory muscle use, exp wheezing.

## 2018-02-16 NOTE — Consult Note (Signed)
[facility] Renal Consultation Note    Indication for Consultation:  Management of ESRD/hemodialysis; anemia, hypertension/volume and secondary hyperparathyroidism PCP:  HPI: Kenneth [name] is a 62 y.o. male with ESRD on HD (TTS at [facility]), HTN, NICM, CHF,  hx alcohol abuse.  He is admitted with dyspnea/respiratory distress. Had similar admission earlier this month. Developed SOB overnight and presented to ED. CXR showing CHF mild pulmonary interstitial edema. Requiring Bi-PAP in the ED. Unable to give much history. We are asked to see for urgent dialysis.   He has been compliant with outpatient HD and has not missed any treatments. Last HD was Saturday, he completed his full treatment and reached his target weight.   Past Medical History:  Diagnosis Date  . Acute CHF ([place]) 01/2018  . Cardiomyopathy secondary    likely related to HTN heart disease; possibly ETOH related as well  . Chronic combined systolic and diastolic heart failure (HCC)    Echocardiogram 09/22/11: Moderate LVH, EF 78-46%, grade 3 diastolic dysfunction, mild MR, moderate to severe LAE, mild RVE, mild to moderate TR, small to moderate pericardial effusion  . ESRD (end stage renal disease) on dialysis [facility])    due to hypertensive nephrosclerosis  . History of alcohol abuse   . Hypertension    Past Surgical History:  Procedure Laterality Date  . AV FISTULA PLACEMENT Left 05/14/2016   Procedure: LEFT ARM BASILIC VEIN TRANSPOSITION;  Surgeon: Rosetta Posner, MD;  Location: [place];  Service: Vascular;  Laterality: Left;  . PERIPHERAL VASCULAR CATHETERIZATION N/A 05/13/2016   Procedure: Dialysis/Perma Catheter Insertion;  Surgeon: Serafina Mitchell, MD;  Location: [place] [facility];  Service: Cardiovascular;  Laterality: N/A;   Family History  Problem Relation Age of Onset  . Emphysema Mother   . Cirrhosis Father    Social History:  reports that he has been smoking cigars.  He has quit using  smokeless tobacco. He reports that he drinks about 18.0 oz of alcohol per week. He reports that he does not use drugs. No Known Allergies Prior to Admission medications   Medication Sig Start Date End Date Taking? Authorizing Provider  amLODipine (NORVASC) 10 MG tablet Take 10 mg by mouth at bedtime.   Yes [provider]  calcium acetate (PHOSLO) 667 MG capsule Take 1,334-2,001 mg by mouth See admin instructions. 2,001 mg three times a day with meals and 1,334 mg two times a day with snacks   Yes [provider]  diphenhydramine-acetaminophen (TYLENOL PM) 25-500 MG TABS tablet Take 1 tablet by mouth at bedtime as needed (for sleep).   Yes [provider]  metoprolol tartrate (LOPRESSOR) 25 MG tablet Take a half tablet (12.5 mg) twice a day. Do not take on mornings of dialysis Patient taking differently: Take 12.5 mg by mouth See admin instructions. Take a half tablet (12.5 mg) twice a day. Do not take on mornings of dialysis 12/04/17  Yes Tawny Asal, MD  acetaminophen (TYLENOL) 325 MG tablet Take 2 tablets (650 mg total) by mouth every 6 (six) hours as needed for mild pain (or Fever >/= 101). Patient not taking: Reported on 01/29/2018 10/11/16   Domenic Polite, MD  diclofenac sodium (VOLTAREN) 1 % GEL Apply 2 g topically 4 (four) times daily. For back pain and spasm Patient not taking: Reported on 02/16/2018 12/04/17   Tawny Asal, MD  folic acid (FOLVITE) 1 MG tablet Take 1 tablet (1 mg total) by mouth daily. Patient not taking: Reported on  02/16/2018 01/28/18   Cristal Ford, DO  Multiple Vitamin (MULTIVITAMIN WITH MINERALS) TABS tablet Take 1 tablet by mouth daily. Patient not taking: Reported on 02/16/2018 01/28/18   Cristal Ford, DO  thiamine 100 MG tablet Take 1 tablet (100 mg total) by mouth daily. Patient not taking: Reported on 02/16/2018 01/28/18   Cristal Ford, DO   Current Facility-Administered Medications  Medication Dose Route Frequency Provider Last  Rate Last Dose  . 0.9 %  sodium chloride infusion  100 mL Intravenous PRN Birdena Kingma, Thomos Lemons, PA-C      . 0.9 %  sodium chloride infusion  100 mL Intravenous PRN Nic Lampe, Thomos Lemons, PA-C      . heparin injection 1,000 Units  1,000 Units Dialysis PRN Lynnda Child, PA-C      . heparin injection 1,700 Units  20 Units/kg Dialysis PRN Lynnda Child, PA-C      . lidocaine (PF) (XYLOCAINE) 1 % injection 5 mL  5 mL Intradermal PRN Lynnda Child, PA-C      . lidocaine-prilocaine (EMLA) cream 1 application  1 application Topical PRN Maigan Bittinger, Thomos Lemons, PA-C      . pentafluoroprop-tetrafluoroeth (GEBAUERS) aerosol 1 application  1 application Topical PRN Lynnda Child, PA-C       Current Outpatient Medications  Medication Sig Dispense Refill  . amLODipine (NORVASC) 10 MG tablet Take 10 mg by mouth at bedtime.    . calcium acetate (PHOSLO) 667 MG capsule Take 1,334-2,001 mg by mouth See admin instructions. 2,001 mg three times a day with meals and 1,334 mg two times a day with snacks    . diphenhydramine-acetaminophen (TYLENOL PM) 25-500 MG TABS tablet Take 1 tablet by mouth at bedtime as needed (for sleep).    . metoprolol tartrate (LOPRESSOR) 25 MG tablet Take a half tablet (12.5 mg) twice a day. Do not take on mornings of dialysis (Patient taking differently: Take 12.5 mg by mouth See admin instructions. Take a half tablet (12.5 mg) twice a day. Do not take on mornings of dialysis) 60 tablet 1  . acetaminophen (TYLENOL) 325 MG tablet Take 2 tablets (650 mg total) by mouth every 6 (six) hours as needed for mild pain (or Fever >/= 101). (Patient not taking: Reported on 01/29/2018)    . diclofenac sodium (VOLTAREN) 1 % GEL Apply 2 g topically 4 (four) times daily. For back pain and spasm (Patient not taking: Reported on 0/27/7412) 1 Tube 0  . folic acid (FOLVITE) 1 MG tablet Take 1 tablet (1 mg total) by mouth daily. (Patient not taking: Reported on 02/16/2018) 30 tablet 0   . Multiple Vitamin (MULTIVITAMIN WITH MINERALS) TABS tablet Take 1 tablet by mouth daily. (Patient not taking: Reported on 02/16/2018) 30 tablet 0  . thiamine 100 MG tablet Take 1 tablet (100 mg total) by mouth daily. (Patient not taking: Reported on 02/16/2018) 30 tablet 0    ROS: As per HPI otherwise negative.  Physical Exam: Vitals:   02/16/18 0900 02/16/18 0915 02/16/18 0930 02/16/18 0945  BP: (!) 160/85 (!) 159/75 (!) 160/74 (!) 175/81  Pulse: 77 87 76 86  Resp: 16 18 16 18   Temp:      TempSrc:      SpO2: 96% 96% 98% 98%  Weight:      Height:         General: Ill-appearing male on Bi-PAP Head: NCAT sclera not icteric MMM Neck: Supple. No JVD No masses Lungs: CTAB Faint crackles at baseline  Heart:  RRR with S1 S2 Abdomen: soft NT + BS Lower extremities:without edema or ischemic changes, no open wounds  Neuro: A & O  X 3. Moves all extremities spontaneously. Psych:  Drowsy, nonverbal response to questions Dialysis Access: LUE AVF +bruit   Labs: Basic Metabolic Panel: Recent Labs  Lab 02/16/18 0647  NA 138  K 4.7  CL 102  CO2 20*  GLUCOSE 89  BUN 48*  CREATININE 12.02*  CALCIUM 9.2   Liver Function Tests: No results for input(s): AST, ALT, ALKPHOS, BILITOT, PROT, ALBUMIN in the last 168 hours. No results for input(s): LIPASE, AMYLASE in the last 168 hours. No results for input(s): AMMONIA in the last 168 hours. CBC: Recent Labs  Lab 02/16/18 0647  WBC 8.8  HGB 10.6*  HCT 31.1*  MCV 96.6  PLT 240   Cardiac Enzymes: No results for input(s): CKTOTAL, CKMB, CKMBINDEX, TROPONINI in the last 168 hours. CBG: No results for input(s): GLUCAP in the last 168 hours. Iron Studies: No results for input(s): IRON, TIBC, TRANSFERRIN, FERRITIN in the last 72 hours. Studies/Results: Dg Chest Portable 1 View  Result Date: 02/16/2018 CLINICAL DATA:  2 hours of shortness of breath, wheezing, and dyspnea. History of dialysis dependent renal failure, CHF. EXAM: PORTABLE  CHEST 1 VIEW COMPARISON:  Portable chest x-ray of January 26, 2018 FINDINGS: The lungs are well-expanded. The interstitial markings are mildly increased diffusely. This is not as significant as that seen on the previous study. The cardiac silhouette remains enlarged. The pulmonary vascularity is engorged. There is no pleural effusion. IMPRESSION: CHF with mild pulmonary interstitial edema.  No alveolar pneumonia. Electronically Signed   By: David  [name] M.D.   On: 02/16/2018 07:31    Dialysis Orders:  [facility] TTS 4.25h 180NRe 400/800 EDW 86kg 2K/2Ca L AVF Hep 2400 U Bolus Venofer 100mg  IV x 5 (1/5 dosed) Venofer 50mg  IV q week Calcitrol 2.24mcg PO TIW Sensipar 90mg  PO TIW   Assessment/Plan: 1.  Dyspnea/Respiratory distress 2/2 Volume overload/CHF - Plan HD today for volume removal and assess need for additional HD tomorrow. Recent admit earlier this month with similar presentation. Compliant with HD schedule. May need to consider 4x/week HD as outpatient  2.  ESRD -  TTS. For HD today  3.  Hypertension/volume  - BP elevated. Continue home meds/UF for volume  4.  Anemia  - Hgb 10.1. No ESA needs  5.  Metabolic bone disease -  Continue Calcitriol/binders/Sensipar  6.  Nutrition - Renal diet/vitamins   [facility] Pager [phone] 02/16/2018, 10:38 AM

## 2018-02-16 NOTE — ED Provider Notes (Signed)
[facility] EMERGENCY DEPARTMENT Provider Note   CSN: [id number] Arrival date & time: 02/16/18  6226     History   Chief Complaint Chief Complaint  Patient presents with  . Shortness of Breath    HPI Kenneth [name] is a 62 y.o. male.  Patient is a 62 year old male with a history of end-stage renal disease on dialysis for the last 8 months, hypertension, pulmonary edema, atrial fibrillation, tobacco and prior alcohol abuse presenting today with sudden onset of shortness of breath.  Patient states he felt fine yesterday and approximately 2 hours ago he started wheezing and having significant shortness of breath.  He denies fever but he has had a cough with slight mucus which is white to yellow.  Patient dialyzes on Tuesday Thursday and Saturday and he completed a full course of dialysis on Saturday at his center.  He has been taking his medications as prescribed and has not had any recent medication changes.  He has not noticed any lower extremity swelling or abdominal distention.  He is complaining of some soreness in the left side of his chest today since the shortness of breath started.  He denies any nausea, vomiting or abdominal pain. Patient was brought in by EMS and in route received albuterol, Atrovent, Solu-Medrol.  Patient states he does not feel his shortness of breath has significantly changed since receiving the medications.  He does smoke cigarettes but does not use any inhalers at home and does not have any known lung disease.  Patient was seen at the beginning of April for similar symptoms and at that time had flash pulmonary edema.  The history is provided by the patient and medical records.    Past Medical History:  Diagnosis Date  . Acute CHF ([place]) 01/2018  . Cardiomyopathy secondary    likely related to HTN heart disease; possibly ETOH related as well  . Chronic combined systolic and diastolic heart failure (HCC)    Echocardiogram 09/22/11: Moderate  LVH, EF 33-35%, grade 3 diastolic dysfunction, mild MR, moderate to severe LAE, mild RVE, mild to moderate TR, small to moderate pericardial effusion  . CKD (chronic kidney disease) stage 4, GFR 15-29 ml/min (HCC)    renal failure; due to hypertensive nephrosclerosis  . History of alcohol abuse   . Hypertension     Patient Active Problem List   Diagnosis Date Noted  . Pulmonary edema 01/26/2018  . Acute respiratory failure with hypoxia ([place]) 01/26/2018  . Atrial fibrillation and flutter ([place])   . Shortness of breath   . Acute on chronic diastolic CHF (congestive heart failure) ([place])   . Hypothermia 10/08/2016  . ESRD on hemodialysis ([place]) 10/08/2016  . Fall 10/08/2016  . Syncope 10/07/2016  . Near syncope 07/07/2016  . Cardiomyopathy ([place]) 07/07/2016  . Ventricular tachycardia, non-sustained ([place]) 07/06/2016  . Hypoglycemia 07/06/2016  . Alcohol abuse 05/13/2016  . ESRD on dialysis ([place]) 05/12/2016  . Hypertensive emergency 12/08/2013  . Cardiomyopathy secondary 10/09/2011  . Essential hypertension 10/09/2011  . Alcohol withdrawal delirium ([place]) 09/25/2011  . Systolic and diastolic CHF, chronic ([place]) 09/24/2011    Past Surgical History:  Procedure Laterality Date  . AV FISTULA PLACEMENT Left 05/14/2016   Procedure: LEFT ARM BASILIC VEIN TRANSPOSITION;  Surgeon: Rosetta Posner, MD;  Location: [facility];  Service: Vascular;  Laterality: Left;  . PERIPHERAL VASCULAR CATHETERIZATION N/A 05/13/2016   Procedure: Dialysis/Perma Catheter Insertion;  Surgeon: Serafina Mitchell, MD;  Location: [facility];  Service:  Cardiovascular;  Laterality: N/A;        Home Medications    Prior to Admission medications   Medication Sig Start Date End Date Taking? Authorizing Provider  acetaminophen (TYLENOL) 325 MG tablet Take 2 tablets (650 mg total) by mouth every 6 (six) hours as needed for mild pain (or Fever >/= 101). Patient not taking: Reported on 01/29/2018 10/11/16   Domenic Polite, MD    amLODipine (NORVASC) 10 MG tablet Take 10 mg by mouth at bedtime.    [provider]  calcium acetate (PHOSLO) 667 MG capsule Take 1,334-2,001 mg by mouth See admin instructions. 2,001 mg three times a day with meals and 1,334 mg two times a day with snacks    [provider]  diclofenac sodium (VOLTAREN) 1 % GEL Apply 2 g topically 4 (four) times daily. For back pain and spasm 12/04/17   Tawny Asal, MD  diphenhydramine-acetaminophen (TYLENOL PM) 25-500 MG TABS tablet Take 1 tablet by mouth at bedtime as needed (for sleep).    [provider]  folic acid (FOLVITE) 1 MG tablet Take 1 tablet (1 mg total) by mouth daily. 01/28/18   Mikhail, Velta Addison, DO  metoprolol tartrate (LOPRESSOR) 25 MG tablet Take a half tablet (12.5 mg) twice a day. Do not take on mornings of dialysis 12/04/17   Tawny Asal, MD  Multiple Vitamin (MULTIVITAMIN WITH MINERALS) TABS tablet Take 1 tablet by mouth daily. 01/28/18   Cristal Ford, DO  thiamine 100 MG tablet Take 1 tablet (100 mg total) by mouth daily. 01/28/18   Cristal Ford, DO    Family History Family History  Problem Relation Age of Onset  . Emphysema Mother   . Cirrhosis Father     Social History Social History   Tobacco Use  . Smoking status: Current Every Day Smoker    Types: Cigars  . Smokeless tobacco: Former Network engineer Use Topics  . Alcohol use: Yes    Frequency: Never    Comment: occ  . Drug use: No     Allergies   Patient has no known allergies.   Review of Systems Review of Systems  All other systems reviewed and are negative.    Physical Exam Updated Vital Signs BP (!) 198/100 (BP Location: Right Arm)   Pulse 81   Temp 98.2 F (36.8 C) (Oral)   Resp 19   Ht 5\' 10"  (1.778 m)   Wt 86.2 kg (190 lb)   SpO2 91%   BMI 27.26 kg/m   Physical Exam  Constitutional: He is oriented to person, place, and time. He appears well-developed and well-nourished. No distress.  HENT:  Head:  Normocephalic and atraumatic.  Mouth/Throat: Oropharynx is clear and moist.  Eyes: Pupils are equal, round, and reactive to light. Conjunctivae and EOM are normal.  Neck: Normal range of motion. Neck supple.  Cardiovascular: Normal rate, regular rhythm and intact distal pulses.  No murmur heard. Pulmonary/Chest: Effort normal. No accessory muscle usage. Tachypnea noted. No respiratory distress. He has wheezes. He has no rales.  Patient can speak in short 2-3 word sentences  Abdominal: Soft. He exhibits no distension. There is no tenderness. There is no rebound and no guarding.  Musculoskeletal: Normal range of motion. He exhibits no edema or tenderness.  Patent AV fistula present in the left upper arm  Neurological: He is alert and oriented to person, place, and time.  Skin: Skin is warm and dry. No rash noted. No erythema.  Psychiatric: He has a  normal mood and affect. His behavior is normal.  Nursing note and vitals reviewed.    ED Treatments / Results  Labs (all labs ordered are listed, but only abnormal results are displayed) Labs Reviewed  BASIC METABOLIC PANEL - Abnormal; Notable for the following components:      Result Value   CO2 20 (*)    BUN 48 (*)    Creatinine, Ser 12.02 (*)    GFR calc non Af Amer 4 (*)    GFR calc Af Amer 4 (*)    Anion gap 16 (*)    All other components within normal limits  CBC - Abnormal; Notable for the following components:   RBC 3.22 (*)    Hemoglobin 10.6 (*)    HCT 31.1 (*)    All other components within normal limits  BRAIN NATRIURETIC PEPTIDE - Abnormal; Notable for the following components:   B Natriuretic Peptide 1,594.1 (*)    All other components within normal limits  I-STAT TROPONIN, ED    EKG EKG Interpretation  Date/Time:  Tuesday February 16 2018 06:47:29 EDT Ventricular Rate:  82 PR Interval:    QRS Duration: 100 QT Interval:  416 QTC Calculation: 486 R Axis:   14 Text Interpretation:  Sinus rhythm Probable  anteroseptal infarct, old ST elevation, consider inferior injury No significant change since last tracing Baseline wander Confirmed by Blanchie Dessert [phone]) on 02/16/2018 7:07:09 AM   Radiology Dg Chest Portable 1 View  Result Date: 02/16/2018 CLINICAL DATA:  2 hours of shortness of breath, wheezing, and dyspnea. History of dialysis dependent renal failure, CHF. EXAM: PORTABLE CHEST 1 VIEW COMPARISON:  Portable chest x-ray of January 26, 2018 FINDINGS: The lungs are well-expanded. The interstitial markings are mildly increased diffusely. This is not as significant as that seen on the previous study. The cardiac silhouette remains enlarged. The pulmonary vascularity is engorged. There is no pleural effusion. IMPRESSION: CHF with mild pulmonary interstitial edema.  No alveolar pneumonia. Electronically Signed   By: David  [name] M.D.   On: 02/16/2018 07:31    Procedures Procedures (including critical care time)  Medications Ordered in ED Medications  albuterol (PROVENTIL,VENTOLIN) solution continuous neb (10 mg/hr Nebulization Given 02/16/18 0650)     Initial Impression / Assessment and Plan / ED Course  I have reviewed the triage vital signs and the nursing notes.  Pertinent labs & imaging results that were available during my care of the patient were reviewed by me and considered in my medical decision making (see chart for details).     Patient presenting with sudden onset of shortness of breath that started 2 hours prior to arrival.  Patient with known history of end-stage renal disease and no documented history of lung disease or use of inhalers at home.  He denies any fever or infectious symptoms prior to 2 hours ago.  Patient has been going to dialysis regularly per his report.  He has also been taking his medications regularly per his report.  Concern for flash pulmonary edema today.  Patient is diffusely wheezing, tachypneic and slight accessory muscle use.  He has received albuterol,  Atrovent, Solu-Medrol in route by EMS without any change in his symptoms. He is also complaining of some mild left-sided chest discomfort.  EKG without acute findings today.  Portable chest, CBC, BMP, troponin pending.  Patient will be placed on BiPAP for shortness of breath and wheezing.  9:18 AM Labs consistent with end-stage renal disease but no other acute findings.  Chest x-ray with evidence of fluid overload.  After patient started on BiPAP his wheezing has resolved and he is breathing more comfortably.  Spoke with nephrology who will plan on dialyzing the patient.  When he was here last time he had 5-1/2 L removed and significant improvement in his respiratory status.  Will admit for further care.    CRITICAL CARE Performed by: Luellen Howson Total critical care time: 30 minutes Critical care time was exclusive of separately billable procedures and treating other patients. Critical care was necessary to treat or prevent imminent or life-threatening deterioration. Critical care was time spent personally by me on the following activities: development of treatment plan with patient and/or surrogate as well as nursing, discussions with consultants, evaluation of patient's response to treatment, examination of patient, obtaining history from patient or surrogate, ordering and performing treatments and interventions, ordering and review of laboratory studies, ordering and review of radiographic studies, pulse oximetry and re-evaluation of patient's condition.   Final Clinical Impressions(s) / ED Diagnoses   Final diagnoses:  Flash pulmonary edema ([place])  ESRD (end stage renal disease) [facility])    ED Discharge Orders    None       Blanchie Dessert, MD 02/16/18 [phone]

## 2018-02-17 LAB — CBC WITH DIFFERENTIAL/PLATELET
Basophils Absolute: 0 10*3/uL (ref 0.0–0.1)
Basophils Relative: 0 %
EOS ABS: 0 10*3/uL (ref 0.0–0.7)
Eosinophils Relative: 0 %
HEMATOCRIT: 30.4 % — AB (ref 39.0–52.0)
HEMOGLOBIN: 10.5 g/dL — AB (ref 13.0–17.0)
LYMPHS ABS: 0.9 10*3/uL (ref 0.7–4.0)
LYMPHS PCT: 11 %
MCH: 33 pg (ref 26.0–34.0)
MCHC: 34.5 g/dL (ref 30.0–36.0)
MCV: 95.6 fL (ref 78.0–100.0)
MONOS PCT: 11 %
Monocytes Absolute: 0.8 10*3/uL (ref 0.1–1.0)
NEUTROS PCT: 78 %
Neutro Abs: 6.1 10*3/uL (ref 1.7–7.7)
Platelets: 204 10*3/uL (ref 150–400)
RBC: 3.18 MIL/uL — ABNORMAL LOW (ref 4.22–5.81)
RDW: 14.6 % (ref 11.5–15.5)
WBC: 7.9 10*3/uL (ref 4.0–10.5)

## 2018-02-17 LAB — BASIC METABOLIC PANEL
Anion gap: 14 (ref 5–15)
BUN: 30 mg/dL — AB (ref 6–20)
CHLORIDE: 94 mmol/L — AB (ref 101–111)
CO2: 28 mmol/L (ref 22–32)
Calcium: 8.6 mg/dL — ABNORMAL LOW (ref 8.9–10.3)
Creatinine, Ser: 7.34 mg/dL — ABNORMAL HIGH (ref 0.61–1.24)
GFR calc Af Amer: 8 mL/min — ABNORMAL LOW (ref >60)
GFR calc non Af Amer: 7 mL/min — ABNORMAL LOW (ref >60)
GLUCOSE: 120 mg/dL — AB (ref 65–99)
POTASSIUM: 3.8 mmol/L (ref 3.5–5.1)
SODIUM: 136 mmol/L (ref 135–145)

## 2018-02-17 LAB — CBC
HEMATOCRIT: 30.2 % — AB (ref 39.0–52.0)
HEMOGLOBIN: 10.2 g/dL — AB (ref 13.0–17.0)
MCH: 32.4 pg (ref 26.0–34.0)
MCHC: 33.8 g/dL (ref 30.0–36.0)
MCV: 95.9 fL (ref 78.0–100.0)
Platelets: 198 10*3/uL (ref 150–400)
RBC: 3.15 MIL/uL — ABNORMAL LOW (ref 4.22–5.81)
RDW: 14.4 % (ref 11.5–15.5)
WBC: 7.5 10*3/uL (ref 4.0–10.5)

## 2018-02-17 LAB — TROPONIN I: TROPONIN I: 0.05 ng/mL — AB (ref <0.03)

## 2018-02-17 NOTE — Progress Notes (Signed)
PT Cancellation Note  Patient Details Name: Randi College MRN: [id number] DOB: 1955-11-13   Cancelled Treatment:    Reason Eval/Treat Not Completed: Patient at procedure or test/unavailable. Pt at HD.    Shary Decamp [place] 02/17/2018, 12:12 PM [place]

## 2018-02-17 NOTE — Progress Notes (Signed)
[facility] PROGRESS NOTE  Jaryan Chicoine NWG:[id number] DOB: Nov 27, 1955 DOA: 02/16/2018 PCP: [name], MD  Assessment/Plan:  Acute on chronic respiratory failure (resolved) -Patient presenting with recurrent acute on chronic respiratory failure -He had a very similar episode at the beginning of the month which resolved quickly after HD with increased volume pulled off -Per nephrology, the patient is over his dry weight - simply be volume overload in the setting of ESRD but he may also have a component of CHF -off bipap  ESRD -Patient on chronic TTS HD -Nephrology prn order set utilized  Chronic CHF -Patient with worsening SOB and hypoxia with provductive cough -CXR consistent with pulmonary edema -Elevated BNP -As noted above, patient with ESRD and so this may be that patient needs HD with additional fluid drawn off to meet his usual dry weight; however, acute on chronic CHF exacerbation is also a consideration -Will not repeat echocardiogram since this was done in 2/19; at that time, his EF was preserved but in 7/17 his EF was depressed at 25-30%. -cont ASA -cont Lisinopril 2.5 mg daily starting tomorrow -Continue beta blocker -CHF order set utilized -Lasix is unlikely to be beneficial -Initial troponin negative, waiting on repeat  HTN -Continue Norvasc and Lorpressor -Add low-dose Lisinopril  ETOH dependence -Patient with chronic ETOH dependence -He is at increased risk for complications of withdrawal including seizures, DTs -Will observe -CIWA protocol  Tobacco dependence -Encourage cessation.  This was discussed with the patient and should be reviewed on an ongoing basis.   -Patch ordered at patient request.     Code Status: FC Family Communication: none available (indicate person spoken with, relationship, and if by phone, the number) Disposition Plan: HOME today possibly   Consultants:  nephro  Procedures:  none  Antibiotics:  n/a  (indicate start date, and stop date if known)  HPI/Subjective: Feels better. Ambulating around room. A little anxious about gong home.  Objective: Vitals:   02/17/18 0650 02/17/18 0736  BP: (!) 166/78 (!) 153/89  Pulse: 86 78  Resp:    Temp: 98.7 F (37.1 C) 97.9 F (36.6 C)  SpO2: 97% 94%    Intake/Output Summary (Last 24 hours) at 02/17/2018 0926 Last data filed at 02/16/2018 2040 Gross per 24 hour  Intake 240 ml  Output 4000 ml  Net -3760 ml   Filed Weights   02/16/18 0638 02/16/18 1520 02/17/18 0650  Weight: 86.2 kg (190 lb) 87 kg (191 lb 12.8 oz) 88.1 kg (194 lb 3.2 oz)    Exam:   General: NCAT, NAD  Cardiovascular: RRR, no MRG  Respiratory: CTAB, nl wob  Abdomen: NS, BS+, NTTP  Musculoskeletal: moving all extr, nl tone   Data Reviewed: Basic Metabolic Panel: Recent Labs  Lab 02/16/18 0647 02/16/18 1920 02/16/18 2358  NA 138 137 136  K 4.7 3.5 3.8  CL 102 95* 94*  CO2 20* 26 28  GLUCOSE 89 159* 120*  BUN 48* 21* 30*  CREATININE 12.02* 6.74* 7.34*  CALCIUM 9.2 8.6* 8.6*  PHOS  --  4.9*  --    Liver Function Tests: Recent Labs  Lab 02/16/18 1920  ALBUMIN 3.2*   No results for input(s): LIPASE, AMYLASE in the last 168 hours. No results for input(s): AMMONIA in the last 168 hours. CBC: Recent Labs  Lab 02/16/18 0647 02/16/18 1920 02/16/18 2358  WBC 8.8 7.1 7.9  NEUTROABS  --   --  6.1  HGB 10.6* 10.8* 10.5*  HCT 31.1* 30.8* 30.4*  MCV 96.6 95.4 95.6  PLT 240 210 204   Cardiac Enzymes: No results for input(s): CKTOTAL, CKMB, CKMBINDEX, TROPONINI in the last 168 hours. BNP (last 3 results) Recent Labs    12/01/17 0921 01/26/18 0114 02/16/18 0648  BNP 1,765.6* 1,720.1* 1,594.1*    ProBNP (last 3 results) No results for input(s): PROBNP in the last 8760 hours.  CBG: No results for input(s): GLUCAP in the last 168 hours.  Recent Results (from the past 240 hour(s))  MRSA PCR Screening     Status: None   Collection Time:  02/16/18  6:29 PM  Result Value Ref Range Status   MRSA by PCR NEGATIVE NEGATIVE Final    Comment:        The GeneXpert MRSA Assay (FDA approved for NASAL specimens only), is one component of a comprehensive MRSA colonization surveillance program. It is not intended to diagnose MRSA infection nor to guide or monitor treatment for MRSA infections. Performed at [facility], [place] [address] [place], [place] [address]      Studies: Dg Chest Portable 1 View  Result Date: 02/16/2018 CLINICAL DATA:  2 hours of shortness of breath, wheezing, and dyspnea. History of dialysis dependent renal failure, CHF. EXAM: PORTABLE CHEST 1 VIEW COMPARISON:  Portable chest x-ray of January 26, 2018 FINDINGS: The lungs are well-expanded. The interstitial markings are mildly increased diffusely. This is not as significant as that seen on the previous study. The cardiac silhouette remains enlarged. The pulmonary vascularity is engorged. There is no pleural effusion. IMPRESSION: CHF with mild pulmonary interstitial edema.  No alveolar pneumonia. Electronically Signed   By: David  [name] M.D.   On: 02/16/2018 07:31    Scheduled Meds: . amLODipine  10 mg Oral QHS  . aspirin EC  81 mg Oral Daily  . calcium acetate  2,001 mg Oral TID WC  . enoxaparin (LOVENOX) injection  30 mg Subcutaneous Q24H  . folic acid  1 mg Oral Daily  . lisinopril  2.5 mg Oral Daily  . metoprolol tartrate  12.5 mg Oral BID  . multivitamin with minerals  1 tablet Oral Daily  . nicotine  14 mg Transdermal Daily  . sodium chloride flush  3 mL Intravenous Q12H  . thiamine  100 mg Oral Daily   Or  . thiamine  100 mg Intravenous Daily   Continuous Infusions: . sodium chloride    . sodium chloride    . sodium chloride      Principal Problem:   Acute on chronic respiratory failure (HCC) Active Problems:   Chronic combined systolic and diastolic CHF (congestive heart failure) ([place])   Essential hypertension   Alcohol abuse    ESRD on hemodialysis ([place])   Tobacco dependence    Time spent: [facility] Pager AMION If 7PM-7AM, please contact night-coverage at [email or web address], password [facility] 02/17/2018, 9:26 AM  LOS: 1 day

## 2018-02-17 NOTE — Procedures (Signed)
Back on HD again for volume reduction. Got 4 liters off yesterday with improvement in breathing but no appropriate change in weight measured. Tolerating a repeat treatment. Currently without hemodynamic instability.  He is sleeping.  Hope to get him to a new dry weight lower than 86Kg.  Estanislado Emms, MD

## 2018-02-18 LAB — RENAL FUNCTION PANEL
ALBUMIN: 3.7 g/dL (ref 3.5–5.0)
ANION GAP: 15 (ref 5–15)
BUN: 17 mg/dL (ref 6–20)
CHLORIDE: 92 mmol/L — AB (ref 101–111)
CO2: 28 mmol/L (ref 22–32)
Calcium: 9.8 mg/dL (ref 8.9–10.3)
Creatinine, Ser: 4.6 mg/dL — ABNORMAL HIGH (ref 0.61–1.24)
GFR calc Af Amer: 14 mL/min — ABNORMAL LOW (ref >60)
GFR calc non Af Amer: 12 mL/min — ABNORMAL LOW (ref >60)
Glucose, Bld: 84 mg/dL (ref 65–99)
PHOSPHORUS: 4.3 mg/dL (ref 2.5–4.6)
POTASSIUM: 4.1 mmol/L (ref 3.5–5.1)
Sodium: 135 mmol/L (ref 135–145)

## 2018-02-18 LAB — BASIC METABOLIC PANEL
Anion gap: 12 (ref 5–15)
BUN: 23 mg/dL — ABNORMAL HIGH (ref 6–20)
CHLORIDE: 95 mmol/L — AB (ref 101–111)
CO2: 30 mmol/L (ref 22–32)
CREATININE: 6.18 mg/dL — AB (ref 0.61–1.24)
Calcium: 9.3 mg/dL (ref 8.9–10.3)
GFR calc non Af Amer: 9 mL/min — ABNORMAL LOW (ref >60)
GFR, EST AFRICAN AMERICAN: 10 mL/min — AB (ref >60)
GLUCOSE: 92 mg/dL (ref 65–99)
Potassium: 4.7 mmol/L (ref 3.5–5.1)
Sodium: 137 mmol/L (ref 135–145)

## 2018-02-18 MED ORDER — ALTEPLASE 2 MG IJ SOLR: 2.0000 mg | Freq: Once | INTRAMUSCULAR | Status: DC | PRN

## 2018-02-18 MED ORDER — LIDOCAINE HCL (PF) 1 % IJ SOLN: 5.0000 mL | INTRAMUSCULAR | Status: DC | PRN

## 2018-02-18 MED ORDER — PENTAFLUOROPROP-TETRAFLUOROETH EX AERO: 1.0000 "application " | INHALATION_SPRAY | CUTANEOUS | Status: DC | PRN

## 2018-02-18 MED ORDER — HEPARIN SODIUM (PORCINE) 1000 UNIT/ML DIALYSIS: 1000.0000 [IU] | INTRAMUSCULAR | Status: DC | PRN

## 2018-02-18 MED ORDER — SODIUM CHLORIDE 0.9 % IV SOLN: 100.0000 mL | INTRAVENOUS | Status: DC | PRN

## 2018-02-18 MED ORDER — LIDOCAINE-PRILOCAINE 2.5-2.5 % EX CREA: 1.0000 "application " | TOPICAL_CREAM | CUTANEOUS | Status: DC | PRN

## 2018-02-18 MED ORDER — HEPARIN SODIUM (PORCINE) 1000 UNIT/ML DIALYSIS: 20.0000 [IU]/kg | INTRAMUSCULAR | Status: DC | PRN

## 2018-02-18 NOTE — Plan of Care (Signed)
  Problem: Clinical Measurements: Goal: Cardiovascular complication will be avoided Outcome: Progressing Note:  No s/s of cardiovascular complications.   Problem: Pain Managment: Goal: General experience of comfort will improve Outcome: Progressing Note:  Denies c/o pain or discomfort.   Problem: Activity: Goal: Risk for activity intolerance will decrease Outcome: Completed/Met Note:  Ambulates independently without difficulty.

## 2018-02-18 NOTE — Progress Notes (Signed)
Hemodialysis- Treatment completed without issue. Total UF 1.7L. Patient did have episode of hypotension relieved with 200cc saline bolus. No further fluid removed, uf off for rest of treatment. Dr. Florene Glen aware. Post vitals stable. No complaints when standing for weight. Report given to primary RN.

## 2018-02-18 NOTE — Progress Notes (Signed)
Patient is currently on room air with sats of 99%. Patient is in no distress. BIPAP is not needed at this time. Will continue to monitor.

## 2018-02-18 NOTE — Progress Notes (Signed)
Will try later as time and pt allow.     02/18/18 0800  PT Visit Information  Last PT Received On 02/18/18  Reason Eval/Treat Not Completed Patient at procedure or test/unavailable   Mee Hives, PT MS Acute Rehab Dept. Number: [place] [facility] [place]

## 2018-02-18 NOTE — Progress Notes (Signed)
[facility] PROGRESS NOTE  Kenneth [name] SWN:[id number] DOB: [birth year] DOA: 02/16/2018 PCP: [name], MD  [place] Summary: Patient was admitted to the hospital with acute on chronic respiratory failure due to CHF exacerbation and needed dialysis.  Patient received dialysis and respiratory status was returned to baseline.  After his last dialysis treatment he did have a brief episode of hypotension.  He is being monitored overnight and he will likely go home in the morning.  Assessment/Plan:  Hypotension Brief episode of hypotension after dialysis Bolus given Some low blood pressure when patient was returned to the medical floor Monitor overnight and likely discharge in a.m.  Acute on chronic respiratory failure (resolved) -Patient presenting with recurrent acute on chronic respiratory failure -He had a very similar episode at the beginning of the month which resolved quickly after HD with increased volume pulled off -Per nephrology, the patient is over his dry weight - simply be volume overload in the setting of ESRD but he may also have a component of CHF -off bipap  ESRD -Patient on chronic TTS HD -Nephrology prn order set utilized  Chronic CHF -Patient with worsening SOB and hypoxia with provductive cough -CXR consistent with pulmonary edema -Elevated BNP -As noted above, patient with ESRD and so this may be that patient needs HD with additional fluid drawn off to meet his usual dry weight; however, acute on chronic CHF exacerbation is also a consideration -Will not repeat echocardiogram since this was done in 2/19; at that time, his EF was preserved but in 7/17 his EF was depressed at 25-30%. -cont ASA -cont Lisinopril 2.5 mg daily starting tomorrow -Continue beta blocker -CHF order set utilized -Lasix is unlikely to be beneficial -Initial troponin negative, waiting on repeat  HTN -Continue Norvasc and Lorpressor -Added low-dose Lisinopril  ETOH  dependence -Patient with chronic ETOH dependence -He is at increased risk for complications of withdrawal including seizures, DTs -Will observe -CIWA protocol  Tobacco dependence -Encourage cessation.  This was discussed with the patient and should be reviewed on an ongoing basis.   -Patch ordered at patient request.     Code Status: FC Family Communication: none available (indicate person spoken with, relationship, and if by phone, the number) Disposition Plan: HOME today possibly   Consultants:  nephro  Procedures:  none  Antibiotics:  n/a (indicate start date, and stop date if known)  HPI/Subjective: Feels better. Ambulating around room. No overnight events per nursing.  Objective: Vitals:   02/18/18 1315 02/18/18 1440  BP: 131/61 117/62  Pulse: 85 92  Resp:    Temp: 98.4 F (36.9 C)   SpO2: 99%     Intake/Output Summary (Last 24 hours) at 02/18/2018 1636 Last data filed at 02/18/2018 1314 Gross per 24 hour  Intake 900 ml  Output 1793 ml  Net -893 ml   Filed Weights   02/18/18 0448 02/18/18 0700 02/18/18 1113  Weight: 85.4 kg (188 lb 3.2 oz) 85.7 kg (188 lb 15 oz) 84 kg (185 lb 3 oz)    Exam:   General: NCAT, NAD  Cardiovascular: RRR, no MRG  Respiratory: CTAB, nl wob  Abdomen: NS, BS+, NTTP  Musculoskeletal: moving all extr, nl tone   Data Reviewed: Basic Metabolic Panel: Recent Labs  Lab 02/16/18 0647 02/16/18 1920 02/16/18 2358 02/18/18 0327  NA 138 137 136 137  K 4.7 3.5 3.8 4.7  CL 102 95* 94* 95*  CO2 20* 26 28 30   GLUCOSE 89 159* 120* 92  BUN 48*  21* 30* 23*  CREATININE 12.02* 6.74* 7.34* 6.18*  CALCIUM 9.2 8.6* 8.6* 9.3  PHOS  --  4.9*  --   --    Liver Function Tests: Recent Labs  Lab 02/16/18 1920  ALBUMIN 3.2*   No results for input(s): LIPASE, AMYLASE in the last 168 hours. No results for input(s): AMMONIA in the last 168 hours. CBC: Recent Labs  Lab 02/16/18 0647 02/16/18 1920 02/16/18 2358  02/17/18 1236  WBC 8.8 7.1 7.9 7.5  NEUTROABS  --   --  6.1  --   HGB 10.6* 10.8* 10.5* 10.2*  HCT 31.1* 30.8* 30.4* 30.2*  MCV 96.6 95.4 95.6 95.9  PLT 240 210 204 198   Cardiac Enzymes: Recent Labs  Lab 02/17/18 1034  TROPONINI 0.05*   BNP (last 3 results) Recent Labs    12/01/17 0921 01/26/18 0114 02/16/18 0648  BNP 1,765.6* 1,720.1* 1,594.1*    ProBNP (last 3 results) No results for input(s): PROBNP in the last 8760 hours.  CBG: No results for input(s): GLUCAP in the last 168 hours.  Recent Results (from the past 240 hour(s))  MRSA PCR Screening     Status: None   Collection Time: 02/16/18  6:29 PM  Result Value Ref Range Status   MRSA by PCR NEGATIVE NEGATIVE Final    Comment:        The GeneXpert MRSA Assay (FDA approved for NASAL specimens only), is one component of a comprehensive MRSA colonization surveillance program. It is not intended to diagnose MRSA infection nor to guide or monitor treatment for MRSA infections. Performed at [facility], [place] [address], [place], [place] [address]      Studies: No results found.  Scheduled Meds: . amLODipine  10 mg Oral QHS  . aspirin EC  81 mg Oral Daily  . calcium acetate  2,001 mg Oral TID WC  . enoxaparin (LOVENOX) injection  30 mg Subcutaneous Q24H  . folic acid  1 mg Oral Daily  . lisinopril  2.5 mg Oral Daily  . metoprolol tartrate  12.5 mg Oral BID  . multivitamin with minerals  1 tablet Oral Daily  . nicotine  14 mg Transdermal Daily  . sodium chloride flush  3 mL Intravenous Q12H  . thiamine  100 mg Oral Daily   Or  . thiamine  100 mg Intravenous Daily   Continuous Infusions: . sodium chloride      Principal Problem:   Acute on chronic respiratory failure (HCC) Active Problems:   Chronic combined systolic and diastolic CHF (congestive heart failure) ([place])   Essential hypertension   Alcohol abuse   ESRD on hemodialysis ([place])   Tobacco dependence    Time spent:  [facility] Pager AMION If 7PM-7AM, please contact night-coverage at [email or web address], password [facility] 02/18/2018, 4:36 PM  LOS: 2 days

## 2018-02-18 NOTE — Evaluation (Signed)
Physical Therapy Evaluation Patient Details Name: Kolsen Choe MRN: [id number] DOB: 02-25-1956 Today's Date: 02/18/2018   History of Present Illness  62 yo male with onset of acute respiratory failure and current HD patient was admitted, has hypoxia and pulm edema, hypertensive urgency and fluid overload.  PMHx:  HTN, ESRD, EtOH dependence, CHF  Clinical Impression  Pt was seen to determine needs for home, and will be with daughter who can supervise.  Pt is obviously cognitively having some changes, cannot remember basic instructions at times and is confused about his location in the hospital.  Will follow acutely to strengthen and increase his balance, improve endurance and progress with safety awareness.  Follow him up with HHPT for same issues, with family to observe him as needed for safety.    Follow Up Recommendations Home health PT;Supervision/Assistance - 24 hour    Equipment Recommendations  None recommended by PT    Recommendations for Other Services       Precautions / Restrictions Precautions Precautions: Fall Precaution Comments: has limited tolerance for gait and unsteady Restrictions Weight Bearing Restrictions: No      Mobility  Bed Mobility               General bed mobility comments: up in chair when PT arrived  Transfers Overall transfer level: Needs assistance Equipment used: 1 person hand held assist Transfers: Sit to/from Stand Sit to Stand: Min guard;Supervision         General transfer comment: reminders about safety and controlling descent  Ambulation/Gait Ambulation/Gait assistance: Min guard;Min assist Ambulation Distance (Feet): 150 Feet Assistive device: 1 person hand held assist Gait Pattern/deviations: Step-through pattern;Decreased stride length;Wide base of support;Trunk flexed;Drifts right/left Gait velocity: reduced Gait velocity interpretation: <1.31 ft/sec, indicative of household ambulator General Gait Details: lateral  instability and needs cues for controlling balance on the hall  Stairs            Wheelchair Mobility    Modified Rankin (Stroke Patients Only)       Balance Overall balance assessment: Needs assistance   Sitting balance-Leahy Scale: Good     Standing balance support: Single extremity supported Standing balance-Leahy Scale: Fair Standing balance comment: less than fair dynamic balance at times                             Pertinent Vitals/Pain Pain Assessment: No/denies pain    Home Living Family/patient expects to be discharged to:: Private residence Living Arrangements: Other relatives Available Help at Discharge: Family;Available 24 hours/day(daughter) Type of Home: Apartment Home Access: Level entry     Home Layout: One level Home Equipment: None      Prior Function Level of Independence: Needs assistance   Gait / Transfers Assistance Needed: mod I for all gait  ADL's / Homemaking Assistance Needed: daughter helps with cooking and cleaning        Hand Dominance   Dominant Hand: Right    Extremity/Trunk Assessment   Upper Extremity Assessment Upper Extremity Assessment: Generalized weakness    Lower Extremity Assessment Lower Extremity Assessment: Generalized weakness    Cervical / Trunk Assessment Cervical / Trunk Assessment: Normal  Communication   Communication: No difficulties  Cognition Arousal/Alertness: Lethargic Behavior During Therapy: Impulsive Overall Cognitive Status: No family/caregiver present to determine baseline cognitive functioning Area of Impairment: Attention;Following commands;Memory;Safety/judgement;Awareness;Problem solving                   Current Attention  Level: Selective Memory: Decreased recall of precautions;Decreased short-term memory Following Commands: Follows one step commands inconsistently Safety/Judgement: Decreased awareness of safety;Decreased awareness of deficits Awareness:  Intellectual Problem Solving: Slow processing;Difficulty sequencing        General Comments General comments (skin integrity, edema, etc.): Pt has line in R antecubital fossa and generally several skin breaks    Exercises     Assessment/Plan    PT Assessment Patient needs continued PT services  PT Problem List Decreased strength;Decreased range of motion;Decreased activity tolerance;Decreased balance;Decreased mobility;Decreased coordination;Decreased cognition;Decreased knowledge of use of DME;Decreased safety awareness;Decreased knowledge of precautions;Cardiopulmonary status limiting activity;Obesity;Decreased skin integrity;Pain       PT Treatment Interventions      PT Goals (Current goals can be found in the Care Plan section)  Acute Rehab PT Goals PT Goal Formulation: Patient unable to participate in goal setting Time For Goal Achievement: 02/25/18 Potential to Achieve Goals: Good    Frequency Min 3X/week   Barriers to discharge (home in level environment with daughter)      Co-evaluation               AM-PAC PT "6 Clicks" Daily Activity  Outcome Measure Difficulty turning over in bed (including adjusting bedclothes, sheets and blankets)?: A Little Difficulty moving from lying on back to sitting on the side of the bed? : Unable Difficulty sitting down on and standing up from a chair with arms (e.g., wheelchair, bedside commode, etc,.)?: Unable Help needed moving to and from a bed to chair (including a wheelchair)?: A Little Help needed walking in hospital room?: A Little Help needed climbing 3-5 steps with a railing? : A Little 6 Click Score: 14    End of Session Equipment Utilized During Treatment: Gait belt Activity Tolerance: Patient tolerated treatment well Patient left: in bed;with call bell/phone within reach;with chair alarm set Nurse Communication: Mobility status PT Visit Diagnosis: Unsteadiness on feet (R26.81);Other abnormalities of gait and  mobility (R26.89);Repeated falls (R29.6);Muscle weakness (generalized) (M62.81);History of falling (Z91.81);Ataxic gait (R26.0)    Time: 8182-9937 PT Time Calculation (min) (ACUTE ONLY): 16 min   Charges:   PT Evaluation $PT Eval Moderate Complexity: 1 Mod     PT G Codes:   PT G-Codes **NOT FOR INPATIENT CLASS** Functional Assessment Tool Used: AM-PAC 6 Clicks Basic Mobility    Ramond Dial 02/18/2018, 3:14 PM   Mee Hives, PT MS Acute Rehab Dept. Number: [place] [facility] [place]

## 2018-02-18 NOTE — Procedures (Signed)
On treatment and back on usual TTS schedule. Goal 4000cc but developed symptomatic hypotension, so UF stopped Currently at EDW(86Kg EDW, actual 85.7kg pre) and trying to get lower to optimize outpt care and prevent readmissions. K 4.7 Hgb 10.2 If he is ok post treatment, then no renal contraindications for DC Erling Cruz, MD

## 2018-02-18 NOTE — Progress Notes (Signed)
HD initiated via L AVF using 16g needles x2 without issue. Patient has no complaints. States he is going home today. 2k bath, 3500cc goal. Continue to monitor.

## 2018-02-19 LAB — BASIC METABOLIC PANEL
ANION GAP: 13 (ref 5–15)
BUN: 28 mg/dL — AB (ref 6–20)
CALCIUM: 9.5 mg/dL (ref 8.9–10.3)
CO2: 25 mmol/L (ref 22–32)
Chloride: 95 mmol/L — ABNORMAL LOW (ref 101–111)
Creatinine, Ser: 6.05 mg/dL — ABNORMAL HIGH (ref 0.61–1.24)
GFR calc Af Amer: 10 mL/min — ABNORMAL LOW (ref >60)
GFR, EST NON AFRICAN AMERICAN: 9 mL/min — AB (ref >60)
GLUCOSE: 85 mg/dL (ref 65–99)
Potassium: 4.4 mmol/L (ref 3.5–5.1)
Sodium: 133 mmol/L — ABNORMAL LOW (ref 135–145)

## 2018-02-19 MED ORDER — LISINOPRIL 2.5 MG PO TABS: 2.5000 mg | ORAL_TABLET | Freq: Every day | ORAL | 0 refills | Status: DC

## 2018-02-19 MED ORDER — ASPIRIN 81 MG PO TBEC: 81.0000 mg | DELAYED_RELEASE_TABLET | Freq: Every day | ORAL | 0 refills | Status: DC

## 2018-02-19 NOTE — Care Management Note (Signed)
Case Management Note  Patient Details  Name: Kenneth [name] MRN: [id number] Date of Birth: Oct 10, 1956  Subjective/Objective:            Spoke w patient in the room. He is agreeable to [facility]. RN and PT would help to decrease chance of readmission. Patient would like to use [facility]. Referral accepted by [name]. Patient states that he has RW at home, no other DME needs identified. No other CM needs at this time.         Action/Plan:   Expected Discharge Date:                  Expected Discharge Plan:  [name]  In-House Referral:     Discharge planning Services  CM Consult  Post Acute Care Choice:  Home Health Choice offered to:  Patient  DME Arranged:    DME Agency:     HH Arranged:  PT, RN [place] Agency:  [facility]  Status of Service:  In process, will continue to follow  If discussed at Long Length of Stay Meetings, dates discussed:    Additional Comments:  [name], RN 02/19/2018, 12:19 PM

## 2018-02-19 NOTE — Progress Notes (Signed)
Discharge instruction was given to pt.  Cab will be called for pt.  Idolina Primer, RN

## 2018-02-19 NOTE — Progress Notes (Signed)
[facility] Progress Note   Subjective:  Lying in bed. Breathing comfortably on RA.  No c/os  HD yesterday net UF 3.5L   Objective Vitals:   02/18/18 2019 02/19/18 0016 02/19/18 0314 02/19/18 0829  BP: 128/75 (!) 142/75 123/78 120/83  Pulse: 81 74 72 83  Resp:      Temp: 98 F (36.7 C) 98.3 F (36.8 C) 98.5 F (36.9 C) (!) 97.5 F (36.4 C)  TempSrc: Oral Oral Oral Oral  SpO2: 99% 99% 98% 100%  Weight:   84.1 kg (185 lb 6.4 oz)   Height:       Physical Exam General: lying in bed NAD Heart: RRR Lungs: CTAB  Abdomen: soft NT Extremities: no LE edema  Dialysis Access: LUE AVF +bruit   Dialysis Orders:  [facility] TTS 4.25h 180NRe 400/800 EDW 86kg 2K/2Ca L AVF Hep 2400 U Bolus Venofer 100mg  IV x 5 (1/5 dosed) Venofer 50mg  IV q week Calcitrol 2.57mcg PO TIW Sensipar 90mg  PO TIW    Assessment/Plan: 1. Dyspnea/Respiratory distress 2/2 Volume overload/CHF - Resolved with HD x3 during admit.  2. ESRD -  TTS. Next HD 4/27 3.  Hypertension/volume  - BP much improved with UF/ Post HD wt 84kg. Lower EDW at discharge  4.  Anemia  - Hgb 10.1. No ESA needs  5.  Metabolic bone disease -  Continue Calcitriol/binders/Sensipar  6.  Nutrition - Renal diet/vitamins    Lynnda Child PA-C [facility] Pager [phone] 02/19/2018,10:26 AM  LOS: 3 days   Additional Objective Labs: Basic Metabolic Panel: Recent Labs  Lab 02/16/18 1920  02/18/18 0327 02/18/18 1813 02/19/18 0420  NA 137   < > 137 135 133*  K 3.5   < > 4.7 4.1 4.4  CL 95*   < > 95* 92* 95*  CO2 26   < > 30 28 25   GLUCOSE 159*   < > 92 84 85  BUN 21*   < > 23* 17 28*  CREATININE 6.74*   < > 6.18* 4.60* 6.05*  CALCIUM 8.6*   < > 9.3 9.8 9.5  PHOS 4.9*  --   --  4.3  --    < > = values in this interval not displayed.   CBC: Recent Labs  Lab 02/16/18 0647 02/16/18 1920 02/16/18 2358 02/17/18 1236  WBC 8.8 7.1 7.9 7.5  NEUTROABS  --   --  6.1  --   HGB 10.6* 10.8* 10.5*  10.2*  HCT 31.1* 30.8* 30.4* 30.2*  MCV 96.6 95.4 95.6 95.9  PLT 240 210 204 198   Blood Culture    Component Value Date/Time   SDES URINE, CLEAN CATCH 05/12/2016 0824   SPECREQUEST NONE 05/12/2016 0824   CULT (A) 05/12/2016 0824    7,000 COLONIES/mL INSIGNIFICANT GROWTH Performed at [place] 05/13/2016 FINAL 05/12/2016 0824    Cardiac Enzymes: Recent Labs  Lab 02/17/18 1034  TROPONINI 0.05*   CBG: No results for input(s): GLUCAP in the last 168 hours. Iron Studies: No results for input(s): IRON, TIBC, TRANSFERRIN, FERRITIN in the last 72 hours. Lab Results  Component Value Date   INR 1.01 10/07/2016   INR 1.51 (H) 05/13/2016   Medications: . sodium chloride     . amLODipine  10 mg Oral QHS  . aspirin EC  81 mg Oral Daily  . calcium acetate  2,001 mg Oral TID WC  . enoxaparin (LOVENOX) injection  30 mg Subcutaneous Q24H  .  folic acid  1 mg Oral Daily  . lisinopril  2.5 mg Oral Daily  . metoprolol tartrate  12.5 mg Oral BID  . multivitamin with minerals  1 tablet Oral Daily  . nicotine  14 mg Transdermal Daily  . sodium chloride flush  3 mL Intravenous Q12H  . thiamine  100 mg Oral Daily   Or  . thiamine  100 mg Intravenous Daily

## 2018-02-19 NOTE — Progress Notes (Signed)
Pt's discharge destination was changed to [place] [address], [place], [address] since nobody lives in the address listed in his chart per his daughter Margarita Mail.  Idolina Primer, RN

## 2018-02-19 NOTE — Discharge Summary (Signed)
Physician Discharge Summary  Kenneth [name] DOA: 02/16/2018  PCP: [name], MD  Admit date: 02/16/2018 Discharge date: 02/19/2018  Time spent: 40 minutes  Recommendations for Outpatient Follow-up:  1. Follow up outpatient CBC/CMP 2. Ensure follow up with dialysis and outpatient providers 3. ?dementia, pt Kenneth [name]&Ox2, which is around his baseline per discussion with daughter, follow outpatient  Discharge Diagnoses:  Principal Problem:   Acute on chronic respiratory failure ([place]) Active Problems:   Chronic combined systolic and diastolic CHF (congestive heart failure) ([place])   Essential hypertension   Alcohol abuse   ESRD on hemodialysis ([place])   Tobacco dependence   Discharge Condition: stable   Diet recommendation: renal diet  Filed Weights   02/18/18 0700 02/18/18 1113 02/19/18 0314  Weight: 85.7 kg (188 lb 15 oz) 84 kg (185 lb 3 oz) 84.1 kg (185 lb 6.4 oz)    History of present illness:  Per HPI Kenneth [name] is Kenneth [name] 62 year old male with PMH significant for HTN; ESRD on TTS HD; alcohol dependence; and chronic combined CHF presenting with acute onset of SOB. Acute onset of SOB about 2am Monday.  +cough, productive of yellowish sputum.  No fevers.  Further history limited by patient's use of BIPAP.  He was also admitted 4/2-3 for acute hypoxic respiratory failure secondary to acute pulmonary edema and hypertensive emergency; he also required BIPAP during that admission, and he had 5+L removed from HD on 4/2 with rapid improvement in symptoms.  He was admitted with HF exacerbation and need for dialysis.  He was seen by renal who dialyzed him to Kenneth [name] lower dry weight at the time of discharge.  He had Kenneth [name] brief episode of hypotension with dialysis on day prior to discharge that had resolved on the day of discharge.  He was stable and doing well on day of discharge.    Hospital Course:  Hypotension Brief episode of hypotension after dialysis yesterday.  Now  stable  Acute on chronic respiratory failure (resolved) -Patient presenting with recurrent acute on chronic respiratory failure due to volume overload -He had Kenneth [name] very similar episode at the beginning of the month which resolved quickly after HD with increased volume pulled off -Per nephrology, the patient is over his dry weight - Nephrology dialyzed him to lower EDW in order to help optimize outpatient care and prevent readmissions  ESRD  Volume Overload -Patient on chronic TTS HD -Nephrology prn order set utilized - Pt dialyzed to lower EDW at discharge  Wt Readings from Last 3 Encounters:  02/19/18 84.1 kg (185 lb 6.4 oz)  01/27/18 87.4 kg (192 lb 10.9 oz)  12/04/17 87.8 kg (193 lb 9 oz)     Chronic CHF -Patient with worsening SOB and hypoxiawith provductive cough -CXR consistent with pulmonary edema -Sx likely 2/2 volume overload with ESRD, but acute on chronic CHF exacerbation also considered -Willnot repeatechocardiogramsince this was done in 2/19; at that time, his EF was preserved but in 7/17 his EF was depressed at 25-30%. -cont ASA -lisinopril 2.5 mg daily -Continue beta blocker -troponins flat  HTN -Continue Norvasc and Lorpressor -Added low-dose Lisinopril  ETOH dependence -Patient with chronic ETOH dependence -He is atincreasedrisk for complications of withdrawal including seizures, DTs -Doing well at d/c  Tobacco dependence -Encourage cessation. This was discussed with the patient by previous provider. and should be reviewed on an ongoing basis.  -Patch ordered at patient request.  ?Dementia:  Pt Kenneth [name]&Ox2, which seems to be chronic after speaking with  daughter.  Follow up outpatient.  He lives at home with daughter.  Procedures:  dialysis   Consultations:  nephrology  Discharge Exam: Vitals:   02/19/18 0829 02/19/18 1123  BP: 120/83 (!) 140/94  Pulse: 83 76  Resp:    Temp: (!) 97.5 F (36.4 C) 97.9 F (36.6 C)  SpO2: 100% 100%    No complaints.  Kenneth [name]&Ox2.   General: No acute distress. Cardiovascular: Heart sounds show Kenneth [name] regular rate, and rhythm. No gallops or rubs. No murmurs. No JVD. Lungs: Clear to auscultation bilaterally with good air movement. No rales, rhonchi or wheezes. Abdomen: Soft, nontender, nondistended with normal active bowel sounds. No masses. No hepatosplenomegaly. Neurological: Alert and oriented 2. Moves all extremities 4. Cranial nerves II through XII grossly intact. Skin: Warm and dry. No rashes or lesions. Extremities: No clubbing or cyanosis. No edema. Psychiatric: Mood and affect are normal. Insight and judgment are appropriate.  Discharge Instructions   Discharge Instructions    Call MD for:  difficulty breathing, headache or visual disturbances   Complete by:  As directed    Call MD for:  extreme fatigue   Complete by:  As directed    Call MD for:  persistant dizziness or light-headedness   Complete by:  As directed    Call MD for:  persistant nausea and vomiting   Complete by:  As directed    Call MD for:  redness, tenderness, or signs of infection (pain, swelling, redness, odor or green/yellow discharge around incision site)   Complete by:  As directed    Call MD for:  temperature >100.4   Complete by:  As directed    Diet - low sodium heart healthy   Complete by:  As directed    Discharge instructions   Complete by:  As directed    You were seen for shortness of breath.  This was because of volume overload.   You improved with dialysis.  Please follow up tomorrow with your next scheduled dialysis session.  We started you on [name] few new medications.  Please pick these up and follow up with your PCP to discuss your new meds (aspirin and lisinopril).  Please follow up with your PCP within [name] few days.  Please follow up with nephrology as scheduled.  Return if you have new, recurrent, or worsening symptoms.  Please ask your PCP to request records from this hospitalization so  they know what was done and what the next steps will be.   Increase activity slowly   Complete by:  As directed      Allergies as of 02/19/2018   No Known Allergies     Medication List    TAKE these medications   amLODipine 10 MG tablet Commonly known as:  NORVASC Take 10 mg by mouth at bedtime.   aspirin 81 MG EC tablet Take 1 tablet (81 mg total) by mouth daily. Start taking on:  02/20/2018   diphenhydramine-acetaminophen 25-500 MG Tabs tablet Commonly known as:  TYLENOL PM Take 1 tablet by mouth at bedtime as needed (for sleep).   folic acid 1 MG tablet Commonly known as:  FOLVITE Take 1 tablet (1 mg total) by mouth daily.   lisinopril 2.5 MG tablet Commonly known as:  PRINIVIL,ZESTRIL Take 1 tablet (2.5 mg total) by mouth daily. Start taking on:  02/20/2018   metoprolol tartrate 25 MG tablet Commonly known as:  LOPRESSOR Take Theresa Wedel half tablet (12.5 mg) twice Chanteria Haggard day. Do not take on mornings  of dialysis What changed:    how much to take  how to take this  when to take this  additional instructions   multivitamin with minerals Tabs tablet Take 1 tablet by mouth daily.   PHOSLO 667 MG capsule Generic drug:  calcium acetate Take 1,334-2,001 mg by mouth See admin instructions. 2,001 mg three times Santanna Whitford day with meals and 1,334 mg two times Sharni Negron day with snacks Notes to patient:  Take as directed   thiamine 100 MG tablet Take 1 tablet (100 mg total) by mouth daily.      No Known Allergies Follow-up Information    [facility] Follow up.   Contact information: [address] [place] [address] [phone]        [name], MD Follow up.   Specialty:  Family Medicine Contact information: [place] [address] [phone]        [name], [name], MD .   Specialty:  Cardiology Contact information: [address] [place] [address] [phone]            The results of  significant diagnostics from this hospitalization (including imaging, microbiology, ancillary and laboratory) are listed below for reference.    Significant Diagnostic Studies: Dg Chest Portable 1 View  Result Date: 02/16/2018 CLINICAL DATA:  2 hours of shortness of breath, wheezing, and dyspnea. History of dialysis dependent renal failure, CHF. EXAM: PORTABLE CHEST 1 VIEW COMPARISON:  Portable chest x-ray of January 26, 2018 FINDINGS: The lungs are well-expanded. The interstitial markings are mildly increased diffusely. This is not as significant as that seen on the previous study. The cardiac silhouette remains enlarged. The pulmonary vascularity is engorged. There is no pleural effusion. IMPRESSION: CHF with mild pulmonary interstitial edema.  No alveolar pneumonia. Electronically Signed   By: David  [name] M.D.   On: 02/16/2018 07:31   Dg Chest Portable 1 View  Result Date: 01/26/2018 CLINICAL DATA:  Short of breath EXAM: PORTABLE CHEST 1 VIEW COMPARISON:  12/01/2017, 07/28/2017 FINDINGS: Diffuse interstitial and ground-glass opacity. Mild cardiomegaly with vascular congestion. No focal consolidation. No pleural effusion. Aortic atherosclerosis. No pneumothorax. IMPRESSION: 1. Borderline to mild cardiomegaly. Vascular congestion with diffuse interstitial and ground-glass opacity, suspect for pulmonary edema. 2. No focal pulmonary airspace disease.  No pleural effusion. Electronically Signed   By: Donavan Foil M.D.   On: 01/26/2018 00:49    Microbiology: Recent Results (from the past 240 hour(s))  MRSA PCR Screening     Status: None   Collection Time: 02/16/18  6:29 PM  Result Value Ref Range Status   MRSA by PCR NEGATIVE NEGATIVE Final    Comment:        The GeneXpert MRSA Assay (FDA approved for NASAL specimens only), is one component of Ahmaya Ostermiller comprehensive MRSA colonization surveillance program. It is not intended to diagnose MRSA infection nor to guide or monitor treatment for MRSA  infections. Performed at [facility], [place] [address] [place], [place] [address]      Labs: Basic Metabolic Panel: Recent Labs  Lab 02/16/18 1920 02/16/18 2358 02/18/18 0327 02/18/18 1813 02/19/18 0420  NA 137 136 137 135 133*  K 3.5 3.8 4.7 4.1 4.4  CL 95* 94* 95* 92* 95*  CO2 26 28 30 28 25   GLUCOSE 159* 120* 92 84 85  BUN 21* 30* 23* 17 28*  CREATININE 6.74* 7.34* 6.18* 4.60* 6.05*  CALCIUM 8.6* 8.6* 9.3 9.8  9.5  PHOS 4.9*  --   --  4.3  --    Liver Function Tests: Recent Labs  Lab 02/16/18 1920 02/18/18 1813  ALBUMIN 3.2* 3.7   No results for input(s): LIPASE, AMYLASE in the last 168 hours. No results for input(s): AMMONIA in the last 168 hours. CBC: Recent Labs  Lab 02/16/18 0647 02/16/18 1920 02/16/18 2358 02/17/18 1236  WBC 8.8 7.1 7.9 7.5  NEUTROABS  --   --  6.1  --   HGB 10.6* 10.8* 10.5* 10.2*  HCT 31.1* 30.8* 30.4* 30.2*  MCV 96.6 95.4 95.6 95.9  PLT 240 210 204 198   Cardiac Enzymes: Recent Labs  Lab 02/17/18 1034  TROPONINI 0.05*   BNP: BNP (last 3 results) Recent Labs    12/01/17 0921 01/26/18 0114 02/16/18 0648  BNP 1,765.6* 1,720.1* 1,594.1*    ProBNP (last 3 results) No results for input(s): PROBNP in the last 8760 hours.  CBG: No results for input(s): GLUCAP in the last 168 hours.     Signed:  Fayrene Helper MD.  [facility] 02/19/2018, 8:20 PM

## 2018-02-20 DIAGNOSIS — N2581 Secondary hyperparathyroidism of renal origin: Secondary | ICD-10-CM | POA: Diagnosis not present

## 2018-02-20 DIAGNOSIS — N186 End stage renal disease: Secondary | ICD-10-CM | POA: Diagnosis not present

## 2018-02-20 DIAGNOSIS — D509 Iron deficiency anemia, unspecified: Secondary | ICD-10-CM | POA: Diagnosis not present

## 2018-02-22 ENCOUNTER — Telehealth: Payer: Self-pay | Admitting: Family Medicine

## 2018-02-22 NOTE — Telephone Encounter (Signed)
Transition Care Management Follow-up Telephone Call   Physician Discharge Summary  Kenneth [name] QFJ:[id number] DOB: 06-27-1956 DOA: 02/16/2018  PCP: [name], MD  Admit date: 02/16/2018 Discharge date: 02/19/2018  Time spent: 40 minutes  Recommendations for Outpatient Follow-up:  1. Follow up outpatient CBC/CMP 2. Ensure follow up with dialysis and outpatient providers 3. ?dementia, pt A&Ox2, which is around his baseline per discussion with daughter, follow outpatient  Discharge Diagnoses:  Principal Problem:   Acute on chronic respiratory failure ([place]) Active Problems:   Chronic combined systolic and diastolic CHF (congestive heart failure) ([place])   Essential hypertension   Alcohol abuse   ESRD on hemodialysis ([place])   Tobacco dependence    How have you been since you were released from the hospital? "Kenneth [name] (daughter on [place]) says pt doing okay"   Do you understand why you were in the hospital? yes   Do you understand the discharge instructions? yes   Where were you discharged to? Home  Items Reviewed:  Medications reviewed: yes  Allergies reviewed: yes  Dietary changes reviewed: yes  Referrals reviewed: yes   Functional Questionnaire:   Activities of Daily Living (ADLs):   He states they are independent in the following: ambulation, bathing and hygiene, feeding, continence, grooming, toileting and dressing States they require assistance with the following: none   Any transportation issues/concerns?: no   Any patient concerns? no   Confirmed importance and date/time of follow-up visits scheduled yes  Provider Appointment booked with Dr. Volanda Napoleon on 03/03/2018 Wednesday at 10:00 am.  Confirmed with patient if condition begins to worsen call PCP or go to the ER.  Patient was given the office number and encouraged to call back with question or concerns.  : yes

## 2018-02-23 DIAGNOSIS — N186 End stage renal disease: Secondary | ICD-10-CM | POA: Diagnosis not present

## 2018-02-23 DIAGNOSIS — N2581 Secondary hyperparathyroidism of renal origin: Secondary | ICD-10-CM | POA: Diagnosis not present

## 2018-02-23 DIAGNOSIS — D509 Iron deficiency anemia, unspecified: Secondary | ICD-10-CM | POA: Diagnosis not present

## 2018-02-24 DIAGNOSIS — I129 Hypertensive chronic kidney disease with stage 1 through stage 4 chronic kidney disease, or unspecified chronic kidney disease: Secondary | ICD-10-CM | POA: Diagnosis not present

## 2018-02-24 DIAGNOSIS — Z992 Dependence on renal dialysis: Secondary | ICD-10-CM | POA: Diagnosis not present

## 2018-02-24 DIAGNOSIS — N186 End stage renal disease: Secondary | ICD-10-CM | POA: Diagnosis not present

## 2018-02-25 DIAGNOSIS — D509 Iron deficiency anemia, unspecified: Secondary | ICD-10-CM | POA: Diagnosis not present

## 2018-02-25 DIAGNOSIS — N2581 Secondary hyperparathyroidism of renal origin: Secondary | ICD-10-CM | POA: Diagnosis not present

## 2018-02-25 DIAGNOSIS — N186 End stage renal disease: Secondary | ICD-10-CM | POA: Diagnosis not present

## 2018-02-27 DIAGNOSIS — N2581 Secondary hyperparathyroidism of renal origin: Secondary | ICD-10-CM | POA: Diagnosis not present

## 2018-02-27 DIAGNOSIS — D509 Iron deficiency anemia, unspecified: Secondary | ICD-10-CM | POA: Diagnosis not present

## 2018-02-27 DIAGNOSIS — N186 End stage renal disease: Secondary | ICD-10-CM | POA: Diagnosis not present

## 2018-03-02 DIAGNOSIS — D509 Iron deficiency anemia, unspecified: Secondary | ICD-10-CM | POA: Diagnosis not present

## 2018-03-02 DIAGNOSIS — N2581 Secondary hyperparathyroidism of renal origin: Secondary | ICD-10-CM | POA: Diagnosis not present

## 2018-03-02 DIAGNOSIS — N186 End stage renal disease: Secondary | ICD-10-CM | POA: Diagnosis not present

## 2018-03-03 ENCOUNTER — Inpatient Hospital Stay: Payer: Medicare Other | Admitting: Family Medicine

## 2018-03-03 DIAGNOSIS — Z0289 Encounter for other administrative examinations: Secondary | ICD-10-CM

## 2018-03-04 DIAGNOSIS — N2581 Secondary hyperparathyroidism of renal origin: Secondary | ICD-10-CM | POA: Diagnosis not present

## 2018-03-04 DIAGNOSIS — N186 End stage renal disease: Secondary | ICD-10-CM | POA: Diagnosis not present

## 2018-03-04 DIAGNOSIS — D509 Iron deficiency anemia, unspecified: Secondary | ICD-10-CM | POA: Diagnosis not present

## 2018-03-06 DIAGNOSIS — N2581 Secondary hyperparathyroidism of renal origin: Secondary | ICD-10-CM | POA: Diagnosis not present

## 2018-03-06 DIAGNOSIS — D509 Iron deficiency anemia, unspecified: Secondary | ICD-10-CM | POA: Diagnosis not present

## 2018-03-06 DIAGNOSIS — N186 End stage renal disease: Secondary | ICD-10-CM | POA: Diagnosis not present

## 2018-03-09 ENCOUNTER — Inpatient Hospital Stay (HOSPITAL_COMMUNITY)
Admission: EM | Admit: 2018-03-09 | Discharge: 2018-03-27 | DRG: 296 | Disposition: A | Discharge status: Skilled Nursing Facility | Payer: Medicare Other | Attending: Internal Medicine | Admitting: Internal Medicine

## 2018-03-09 ENCOUNTER — Other Ambulatory Visit (HOSPITAL_COMMUNITY): Payer: Medicare Other

## 2018-03-09 ENCOUNTER — Other Ambulatory Visit: Payer: Self-pay

## 2018-03-09 ENCOUNTER — Emergency Department (HOSPITAL_COMMUNITY): Payer: Medicare Other

## 2018-03-09 ENCOUNTER — Inpatient Hospital Stay (HOSPITAL_COMMUNITY): Payer: Medicare Other

## 2018-03-09 ENCOUNTER — Encounter (HOSPITAL_COMMUNITY): Payer: Self-pay | Admitting: Emergency Medicine

## 2018-03-09 DIAGNOSIS — K567 Ileus, unspecified: Secondary | ICD-10-CM

## 2018-03-09 DIAGNOSIS — R1110 Vomiting, unspecified: Secondary | ICD-10-CM

## 2018-03-09 DIAGNOSIS — Z9911 Dependence on respirator [ventilator] status: Secondary | ICD-10-CM | POA: Diagnosis not present

## 2018-03-09 DIAGNOSIS — R41841 Cognitive communication deficit: Secondary | ICD-10-CM | POA: Diagnosis not present

## 2018-03-09 DIAGNOSIS — Z79899 Other long term (current) drug therapy: Secondary | ICD-10-CM

## 2018-03-09 DIAGNOSIS — Z992 Dependence on renal dialysis: Secondary | ICD-10-CM | POA: Diagnosis not present

## 2018-03-09 DIAGNOSIS — I129 Hypertensive chronic kidney disease with stage 1 through stage 4 chronic kidney disease, or unspecified chronic kidney disease: Secondary | ICD-10-CM | POA: Diagnosis not present

## 2018-03-09 DIAGNOSIS — J811 Chronic pulmonary edema: Secondary | ICD-10-CM

## 2018-03-09 DIAGNOSIS — N2581 Secondary hyperparathyroidism of renal origin: Secondary | ICD-10-CM | POA: Diagnosis not present

## 2018-03-09 DIAGNOSIS — E875 Hyperkalemia: Secondary | ICD-10-CM

## 2018-03-09 DIAGNOSIS — I132 Hypertensive heart and chronic kidney disease with heart failure and with stage 5 chronic kidney disease, or end stage renal disease: Secondary | ICD-10-CM | POA: Diagnosis present

## 2018-03-09 DIAGNOSIS — I361 Nonrheumatic tricuspid (valve) insufficiency: Secondary | ICD-10-CM

## 2018-03-09 DIAGNOSIS — R488 Other symbolic dysfunctions: Secondary | ICD-10-CM | POA: Diagnosis not present

## 2018-03-09 DIAGNOSIS — Z4659 Encounter for fitting and adjustment of other gastrointestinal appliance and device: Secondary | ICD-10-CM

## 2018-03-09 DIAGNOSIS — R1310 Dysphagia, unspecified: Secondary | ICD-10-CM | POA: Insufficient documentation

## 2018-03-09 DIAGNOSIS — R05 Cough: Secondary | ICD-10-CM | POA: Diagnosis not present

## 2018-03-09 DIAGNOSIS — M6281 Muscle weakness (generalized): Secondary | ICD-10-CM | POA: Diagnosis not present

## 2018-03-09 DIAGNOSIS — Z8673 Personal history of transient ischemic attack (TIA), and cerebral infarction without residual deficits: Secondary | ICD-10-CM

## 2018-03-09 DIAGNOSIS — D631 Anemia in chronic kidney disease: Secondary | ICD-10-CM | POA: Diagnosis not present

## 2018-03-09 DIAGNOSIS — J9601 Acute respiratory failure with hypoxia: Secondary | ICD-10-CM | POA: Diagnosis not present

## 2018-03-09 DIAGNOSIS — R2681 Unsteadiness on feet: Secondary | ICD-10-CM | POA: Diagnosis not present

## 2018-03-09 DIAGNOSIS — I4891 Unspecified atrial fibrillation: Secondary | ICD-10-CM | POA: Diagnosis not present

## 2018-03-09 DIAGNOSIS — F17290 Nicotine dependence, other tobacco product, uncomplicated: Secondary | ICD-10-CM | POA: Diagnosis present

## 2018-03-09 DIAGNOSIS — R069 Unspecified abnormalities of breathing: Secondary | ICD-10-CM | POA: Diagnosis not present

## 2018-03-09 DIAGNOSIS — J9600 Acute respiratory failure, unspecified whether with hypoxia or hypercapnia: Secondary | ICD-10-CM

## 2018-03-09 DIAGNOSIS — I469 Cardiac arrest, cause unspecified: Principal | ICD-10-CM

## 2018-03-09 DIAGNOSIS — J810 Acute pulmonary edema: Secondary | ICD-10-CM | POA: Diagnosis not present

## 2018-03-09 DIAGNOSIS — Z9119 Patient's noncompliance with other medical treatment and regimen: Secondary | ICD-10-CM

## 2018-03-09 DIAGNOSIS — I4892 Unspecified atrial flutter: Secondary | ICD-10-CM | POA: Diagnosis present

## 2018-03-09 DIAGNOSIS — I5042 Chronic combined systolic (congestive) and diastolic (congestive) heart failure: Secondary | ICD-10-CM | POA: Diagnosis present

## 2018-03-09 DIAGNOSIS — N186 End stage renal disease: Secondary | ICD-10-CM | POA: Diagnosis not present

## 2018-03-09 DIAGNOSIS — I5032 Chronic diastolic (congestive) heart failure: Secondary | ICD-10-CM | POA: Diagnosis present

## 2018-03-09 DIAGNOSIS — R0602 Shortness of breath: Secondary | ICD-10-CM | POA: Diagnosis not present

## 2018-03-09 DIAGNOSIS — R1313 Dysphagia, pharyngeal phase: Secondary | ICD-10-CM | POA: Diagnosis present

## 2018-03-09 DIAGNOSIS — I10 Essential (primary) hypertension: Secondary | ICD-10-CM | POA: Diagnosis present

## 2018-03-09 DIAGNOSIS — I482 Chronic atrial fibrillation: Secondary | ICD-10-CM | POA: Diagnosis not present

## 2018-03-09 DIAGNOSIS — Z9115 Patient's noncompliance with renal dialysis: Secondary | ICD-10-CM | POA: Diagnosis not present

## 2018-03-09 DIAGNOSIS — G931 Anoxic brain damage, not elsewhere classified: Secondary | ICD-10-CM | POA: Diagnosis present

## 2018-03-09 DIAGNOSIS — R402313 Coma scale, best motor response, none, at hospital admission: Secondary | ICD-10-CM | POA: Diagnosis present

## 2018-03-09 DIAGNOSIS — J9690 Respiratory failure, unspecified, unspecified whether with hypoxia or hypercapnia: Secondary | ICD-10-CM | POA: Diagnosis not present

## 2018-03-09 DIAGNOSIS — Z09 Encounter for follow-up examination after completed treatment for conditions other than malignant neoplasm: Secondary | ICD-10-CM

## 2018-03-09 DIAGNOSIS — E8770 Fluid overload, unspecified: Secondary | ICD-10-CM | POA: Diagnosis present

## 2018-03-09 DIAGNOSIS — E162 Hypoglycemia, unspecified: Secondary | ICD-10-CM | POA: Diagnosis not present

## 2018-03-09 DIAGNOSIS — I5033 Acute on chronic diastolic (congestive) heart failure: Secondary | ICD-10-CM | POA: Diagnosis not present

## 2018-03-09 DIAGNOSIS — R402213 Coma scale, best verbal response, none, at hospital admission: Secondary | ICD-10-CM | POA: Diagnosis present

## 2018-03-09 DIAGNOSIS — Z4682 Encounter for fitting and adjustment of non-vascular catheter: Secondary | ICD-10-CM | POA: Diagnosis not present

## 2018-03-09 DIAGNOSIS — G9340 Encephalopathy, unspecified: Secondary | ICD-10-CM | POA: Diagnosis not present

## 2018-03-09 DIAGNOSIS — Z7982 Long term (current) use of aspirin: Secondary | ICD-10-CM | POA: Diagnosis not present

## 2018-03-09 DIAGNOSIS — R4702 Dysphasia: Secondary | ICD-10-CM | POA: Diagnosis not present

## 2018-03-09 DIAGNOSIS — R0902 Hypoxemia: Secondary | ICD-10-CM | POA: Diagnosis not present

## 2018-03-09 DIAGNOSIS — R402113 Coma scale, eyes open, never, at hospital admission: Secondary | ICD-10-CM | POA: Diagnosis present

## 2018-03-09 DIAGNOSIS — Z9114 Patient's other noncompliance with medication regimen: Secondary | ICD-10-CM

## 2018-03-09 DIAGNOSIS — R1312 Dysphagia, oropharyngeal phase: Secondary | ICD-10-CM | POA: Diagnosis not present

## 2018-03-09 DIAGNOSIS — I272 Pulmonary hypertension, unspecified: Secondary | ICD-10-CM | POA: Diagnosis present

## 2018-03-09 DIAGNOSIS — E872 Acidosis: Secondary | ICD-10-CM | POA: Diagnosis present

## 2018-03-09 DIAGNOSIS — G9341 Metabolic encephalopathy: Secondary | ICD-10-CM | POA: Diagnosis not present

## 2018-03-09 DIAGNOSIS — I120 Hypertensive chronic kidney disease with stage 5 chronic kidney disease or end stage renal disease: Secondary | ICD-10-CM | POA: Diagnosis not present

## 2018-03-09 DIAGNOSIS — E871 Hypo-osmolality and hyponatremia: Secondary | ICD-10-CM | POA: Diagnosis not present

## 2018-03-09 DIAGNOSIS — Z452 Encounter for adjustment and management of vascular access device: Secondary | ICD-10-CM | POA: Diagnosis not present

## 2018-03-09 DIAGNOSIS — M898X9 Other specified disorders of bone, unspecified site: Secondary | ICD-10-CM | POA: Diagnosis present

## 2018-03-09 DIAGNOSIS — R0989 Other specified symptoms and signs involving the circulatory and respiratory systems: Secondary | ICD-10-CM | POA: Diagnosis not present

## 2018-03-09 DIAGNOSIS — R4182 Altered mental status, unspecified: Secondary | ICD-10-CM | POA: Diagnosis not present

## 2018-03-09 DIAGNOSIS — R2689 Other abnormalities of gait and mobility: Secondary | ICD-10-CM | POA: Diagnosis not present

## 2018-03-09 DIAGNOSIS — F10239 Alcohol dependence with withdrawal, unspecified: Secondary | ICD-10-CM | POA: Diagnosis present

## 2018-03-09 DIAGNOSIS — H518 Other specified disorders of binocular movement: Secondary | ICD-10-CM | POA: Diagnosis present

## 2018-03-09 DIAGNOSIS — R1311 Dysphagia, oral phase: Secondary | ICD-10-CM | POA: Diagnosis present

## 2018-03-09 DIAGNOSIS — J9811 Atelectasis: Secondary | ICD-10-CM | POA: Diagnosis not present

## 2018-03-09 LAB — BASIC METABOLIC PANEL
Anion gap: 18 — ABNORMAL HIGH (ref 5–15)
Anion gap: 18 — ABNORMAL HIGH (ref 5–15)
Anion gap: 17 — ABNORMAL HIGH (ref 5–15)
BUN: 54 mg/dL — AB (ref 6–20)
BUN: 64 mg/dL — AB (ref 6–20)
BUN: 67 mg/dL — ABNORMAL HIGH (ref 6–20)
CALCIUM: 9.3 mg/dL (ref 8.9–10.3)
CALCIUM: 8.7 mg/dL — AB (ref 8.9–10.3)
CHLORIDE: 93 mmol/L — AB (ref 101–111)
CHLORIDE: 99 mmol/L — AB (ref 101–111)
CHLORIDE: 100 mmol/L — AB (ref 101–111)
CO2: 24 mmol/L (ref 22–32)
CO2: 19 mmol/L — AB (ref 22–32)
CO2: 20 mmol/L — AB (ref 22–32)
CREATININE: 12.18 mg/dL — AB (ref 0.61–1.24)
Calcium: 9.6 mg/dL (ref 8.9–10.3)
Creatinine, Ser: 11.91 mg/dL — ABNORMAL HIGH (ref 0.61–1.24)
Creatinine, Ser: 13.02 mg/dL — ABNORMAL HIGH (ref 0.61–1.24)
GFR calc Af Amer: 4 mL/min — ABNORMAL LOW (ref >60)
GFR calc Af Amer: 4 mL/min — ABNORMAL LOW (ref >60)
GFR calc non Af Amer: 4 mL/min — ABNORMAL LOW (ref >60)
GFR calc non Af Amer: 4 mL/min — ABNORMAL LOW (ref >60)
GFR calc non Af Amer: 3 mL/min — ABNORMAL LOW (ref >60)
GFR, EST AFRICAN AMERICAN: 4 mL/min — AB (ref >60)
Glucose, Bld: 80 mg/dL (ref 65–99)
Glucose, Bld: 76 mg/dL (ref 65–99)
Glucose, Bld: 79 mg/dL (ref 65–99)
POTASSIUM: 6.2 mmol/L — AB (ref 3.5–5.1)
POTASSIUM: 5.7 mmol/L — AB (ref 3.5–5.1)
Potassium: 7.2 mmol/L (ref 3.5–5.1)
SODIUM: 135 mmol/L (ref 135–145)
SODIUM: 136 mmol/L (ref 135–145)
Sodium: 137 mmol/L (ref 135–145)

## 2018-03-09 LAB — CBC WITH DIFFERENTIAL/PLATELET
BASOS PCT: 1 %
Basophils Absolute: 0.1 10*3/uL (ref 0.0–0.1)
EOS ABS: 1.2 10*3/uL — AB (ref 0.0–0.7)
EOS PCT: 14 %
HCT: 31.4 % — ABNORMAL LOW (ref 39.0–52.0)
HEMOGLOBIN: 10.9 g/dL — AB (ref 13.0–17.0)
LYMPHS PCT: 25 %
Lymphs Abs: 2.1 10*3/uL (ref 0.7–4.0)
MCH: 33.6 pg (ref 26.0–34.0)
MCHC: 34.7 g/dL (ref 30.0–36.0)
MCV: 96.9 fL (ref 78.0–100.0)
Monocytes Absolute: 0.7 10*3/uL (ref 0.1–1.0)
Monocytes Relative: 8 %
NEUTROS PCT: 52 %
Neutro Abs: 4.4 10*3/uL (ref 1.7–7.7)
Platelets: 220 10*3/uL (ref 150–400)
RBC: 3.24 MIL/uL — ABNORMAL LOW (ref 4.22–5.81)
RDW: 14.9 % (ref 11.5–15.5)
WBC: 8.5 10*3/uL (ref 4.0–10.5)

## 2018-03-09 LAB — PROTIME-INR
INR: 1.06
INR: 1.15
PROTHROMBIN TIME: 13.7 s (ref 11.4–15.2)
PROTHROMBIN TIME: 14.7 s (ref 11.4–15.2)

## 2018-03-09 LAB — POCT I-STAT 3, ART BLOOD GAS (G3+)
ACID-BASE DEFICIT: 4 mmol/L — AB (ref 0.0–2.0)
ACID-BASE DEFICIT: 5 mmol/L — AB (ref 0.0–2.0)
BICARBONATE: 20.1 mmol/L (ref 20.0–28.0)
Bicarbonate: 21 mmol/L (ref 20.0–28.0)
O2 SAT: 95 %
O2 SAT: 97 %
PO2 ART: 88 mmHg (ref 83.0–108.0)
TCO2: 22 mmol/L (ref 22–32)
TCO2: 21 mmol/L — ABNORMAL LOW (ref 22–32)
pCO2 arterial: 34.9 mmHg (ref 32.0–48.0)
pCO2 arterial: 32.8 mmHg (ref 32.0–48.0)
pH, Arterial: 7.382 (ref 7.350–7.450)
pH, Arterial: 7.388 (ref 7.350–7.450)
pO2, Arterial: 72 mmHg — ABNORMAL LOW (ref 83.0–108.0)

## 2018-03-09 LAB — POCT I-STAT, CHEM 8
BUN: 49 mg/dL — AB (ref 6–20)
BUN: 56 mg/dL — ABNORMAL HIGH (ref 6–20)
CALCIUM ION: 1.22 mmol/L (ref 1.15–1.40)
CALCIUM ION: 1.12 mmol/L — AB (ref 1.15–1.40)
CREATININE: 12.4 mg/dL — AB (ref 0.61–1.24)
Chloride: 105 mmol/L (ref 101–111)
Chloride: 103 mmol/L (ref 101–111)
Creatinine, Ser: 12.4 mg/dL — ABNORMAL HIGH (ref 0.61–1.24)
GLUCOSE: 78 mg/dL (ref 65–99)
Glucose, Bld: 48 mg/dL — ABNORMAL LOW (ref 65–99)
HCT: 31 % — ABNORMAL LOW (ref 39.0–52.0)
HCT: 27 % — ABNORMAL LOW (ref 39.0–52.0)
HEMOGLOBIN: 10.5 g/dL — AB (ref 13.0–17.0)
Hemoglobin: 9.2 g/dL — ABNORMAL LOW (ref 13.0–17.0)
POTASSIUM: 5.5 mmol/L — AB (ref 3.5–5.1)
Potassium: 5.7 mmol/L — ABNORMAL HIGH (ref 3.5–5.1)
Sodium: 137 mmol/L (ref 135–145)
Sodium: 136 mmol/L (ref 135–145)
TCO2: 22 mmol/L (ref 22–32)
TCO2: 21 mmol/L — ABNORMAL LOW (ref 22–32)

## 2018-03-09 LAB — COMPREHENSIVE METABOLIC PANEL
ALBUMIN: 3 g/dL — AB (ref 3.5–5.0)
ALK PHOS: 46 U/L (ref 38–126)
ALT: 21 U/L (ref 17–63)
ANION GAP: 17 — AB (ref 5–15)
AST: 34 U/L (ref 15–41)
BUN: 60 mg/dL — ABNORMAL HIGH (ref 6–20)
CALCIUM: 9.5 mg/dL (ref 8.9–10.3)
CHLORIDE: 101 mmol/L (ref 101–111)
CO2: 20 mmol/L — AB (ref 22–32)
CREATININE: 11.89 mg/dL — AB (ref 0.61–1.24)
GFR calc non Af Amer: 4 mL/min — ABNORMAL LOW (ref >60)
GFR, EST AFRICAN AMERICAN: 5 mL/min — AB (ref >60)
Glucose, Bld: 45 mg/dL — ABNORMAL LOW (ref 65–99)
Potassium: 6 mmol/L — ABNORMAL HIGH (ref 3.5–5.1)
SODIUM: 138 mmol/L (ref 135–145)
Total Bilirubin: 0.5 mg/dL (ref 0.3–1.2)
Total Protein: 6.7 g/dL (ref 6.5–8.1)

## 2018-03-09 LAB — I-STAT ARTERIAL BLOOD GAS, ED
ACID-BASE DEFICIT: 12 mmol/L — AB (ref 0.0–2.0)
Bicarbonate: 19.5 mmol/L — ABNORMAL LOW (ref 20.0–28.0)
O2 SAT: 99 %
PCO2 ART: 75.5 mmHg — AB (ref 32.0–48.0)
PO2 ART: 214 mmHg — AB (ref 83.0–108.0)
TCO2: 22 mmol/L (ref 22–32)
pH, Arterial: 7.019 — CL (ref 7.350–7.450)

## 2018-03-09 LAB — I-STAT CHEM 8, ED
BUN: 52 mg/dL — AB (ref 6–20)
CHLORIDE: 101 mmol/L (ref 101–111)
CREATININE: 10.6 mg/dL — AB (ref 0.61–1.24)
Calcium, Ion: 1.32 mmol/L (ref 1.15–1.40)
GLUCOSE: 126 mg/dL — AB (ref 65–99)
HEMATOCRIT: 32 % — AB (ref 39.0–52.0)
Hemoglobin: 10.9 g/dL — ABNORMAL LOW (ref 13.0–17.0)
POTASSIUM: 4.3 mmol/L (ref 3.5–5.1)
Sodium: 136 mmol/L (ref 135–145)
TCO2: 23 mmol/L (ref 22–32)

## 2018-03-09 LAB — I-STAT CG4 LACTIC ACID, ED: Lactic Acid, Venous: 8.79 mmol/L (ref 0.5–1.9)

## 2018-03-09 LAB — POCT I-STAT 4, (NA,K, GLUC, HGB,HCT)
Glucose, Bld: 150 mg/dL — ABNORMAL HIGH (ref 65–99)
HCT: 28 % — ABNORMAL LOW (ref 39.0–52.0)
Hemoglobin: 9.5 g/dL — ABNORMAL LOW (ref 13.0–17.0)
Potassium: 6.2 mmol/L — ABNORMAL HIGH (ref 3.5–5.1)
SODIUM: 135 mmol/L (ref 135–145)

## 2018-03-09 LAB — I-STAT VENOUS BLOOD GAS, ED
ACID-BASE DEFICIT: 12 mmol/L — AB (ref 0.0–2.0)
BICARBONATE: 19.6 mmol/L — AB (ref 20.0–28.0)
O2 Saturation: 99 %
PH VEN: 7.019 — AB (ref 7.250–7.430)
PO2 VEN: 215 mmHg — AB (ref 32.0–45.0)
TCO2: 22 mmol/L (ref 22–32)
pCO2, Ven: 76.2 mmHg (ref 44.0–60.0)

## 2018-03-09 LAB — CBC
HCT: 31.5 % — ABNORMAL LOW (ref 39.0–52.0)
HEMOGLOBIN: 10.8 g/dL — AB (ref 13.0–17.0)
MCH: 33.2 pg (ref 26.0–34.0)
MCHC: 34.3 g/dL (ref 30.0–36.0)
MCV: 96.9 fL (ref 78.0–100.0)
PLATELETS: 232 10*3/uL (ref 150–400)
RBC: 3.25 MIL/uL — AB (ref 4.22–5.81)
RDW: 14 % (ref 11.5–15.5)
WBC: 15.5 10*3/uL — ABNORMAL HIGH (ref 4.0–10.5)

## 2018-03-09 LAB — I-STAT TROPONIN, ED: TROPONIN I, POC: 0.04 ng/mL (ref 0.00–0.08)

## 2018-03-09 LAB — CG4 I-STAT (LACTIC ACID)
Lactic Acid, Venous: 8.26 mmol/L (ref 0.5–1.9)
Lactic Acid, Venous: 0.87 mmol/L (ref 0.5–1.9)

## 2018-03-09 LAB — TROPONIN I
TROPONIN I: 0.38 ng/mL — AB (ref <0.03)
TROPONIN I: 0.76 ng/mL — AB (ref <0.03)
Troponin I: 0.71 ng/mL (ref <0.03)

## 2018-03-09 LAB — ECHOCARDIOGRAM COMPLETE
Height: 71 in
WEIGHTICAEL: 3040 [oz_av]

## 2018-03-09 LAB — LACTIC ACID, PLASMA
LACTIC ACID, VENOUS: 2.4 mmol/L — AB (ref 0.5–1.9)
Lactic Acid, Venous: 1 mmol/L (ref 0.5–1.9)

## 2018-03-09 LAB — PHOSPHORUS: PHOSPHORUS: 11.3 mg/dL — AB (ref 2.5–4.6)

## 2018-03-09 LAB — MAGNESIUM: Magnesium: 2.2 mg/dL (ref 1.7–2.4)

## 2018-03-09 LAB — MRSA PCR SCREENING: MRSA by PCR: NEGATIVE

## 2018-03-09 MED ORDER — SODIUM CHLORIDE 0.9 % IV SOLN: INTRAVENOUS | Status: DC | PRN

## 2018-03-09 MED ORDER — SODIUM BICARBONATE 8.4 % IV SOLN
INTRAVENOUS | Status: AC | PRN
  Administered 2018-03-09: 100 meq via INTRAVENOUS

## 2018-03-09 MED ORDER — CHLORHEXIDINE GLUCONATE 0.12% ORAL RINSE (MEDLINE KIT): 15.0000 mL | Freq: Two times a day (BID) | OROMUCOSAL | Status: DC

## 2018-03-09 MED ORDER — DEXTROSE 5 % IV SOLN
INTRAVENOUS | Status: AC | PRN
  Administered 2018-03-09: 300 mg via INTRAVENOUS

## 2018-03-09 MED ORDER — DEXTROSE 50 % IV SOLN
INTRAVENOUS | Status: AC
  Administered 2018-03-09: 50 mL
  Filled 2018-03-09: qty 50

## 2018-03-09 MED ORDER — ASPIRIN 300 MG RE SUPP: 300.0000 mg | RECTAL | Status: DC

## 2018-03-09 MED ORDER — CHLORHEXIDINE GLUCONATE CLOTH 2 % EX PADS
6.0000 | MEDICATED_PAD | Freq: Every day | CUTANEOUS | Status: DC
  Administered 2018-03-09 – 2018-03-20 (×10): 6 via TOPICAL

## 2018-03-09 MED ORDER — FENTANYL CITRATE (PF) 100 MCG/2ML IJ SOLN
INTRAMUSCULAR | Status: AC
  Filled 2018-03-09: qty 2

## 2018-03-09 MED ORDER — SENNOSIDES 8.8 MG/5ML PO SYRP: 5.0000 mL | ORAL_SOLUTION | Freq: Two times a day (BID) | ORAL | Status: DC | PRN

## 2018-03-09 MED ORDER — MIDAZOLAM HCL 2 MG/2ML IJ SOLN
INTRAMUSCULAR | Status: AC
  Filled 2018-03-09: qty 2

## 2018-03-09 MED ORDER — DEXTROSE 50 % IV SOLN: 1.0000 | Freq: Once | INTRAVENOUS | Status: AC

## 2018-03-09 MED ORDER — CHLORHEXIDINE GLUCONATE 0.12% ORAL RINSE (MEDLINE KIT)
15.0000 mL | Freq: Two times a day (BID) | OROMUCOSAL | Status: DC
  Administered 2018-03-09 – 2018-03-12 (×7): 15 mL via OROMUCOSAL

## 2018-03-09 MED ORDER — NOREPINEPHRINE BITARTRATE 1 MG/ML IV SOLN
0.0000 ug/min | INTRAVENOUS | Status: DC
  Filled 2018-03-09: qty 4

## 2018-03-09 MED ORDER — BISACODYL 10 MG RE SUPP: 10.0000 mg | Freq: Every day | RECTAL | Status: DC | PRN

## 2018-03-09 MED ORDER — ETOMIDATE 2 MG/ML IV SOLN
INTRAVENOUS | Status: AC | PRN
  Administered 2018-03-09: 20 mg via INTRAVENOUS

## 2018-03-09 MED ORDER — FENTANYL CITRATE (PF) 100 MCG/2ML IJ SOLN: 50.0000 ug | INTRAMUSCULAR | Status: DC | PRN

## 2018-03-09 MED ORDER — DEXTROSE 50 % IV SOLN
50.0000 mL | Freq: Once | INTRAVENOUS | Status: AC
  Administered 2018-03-09: 50 mL via INTRAVENOUS

## 2018-03-09 MED ORDER — SODIUM CHLORIDE 0.9% FLUSH: 10.0000 mL | INTRAVENOUS | Status: DC | PRN

## 2018-03-09 MED ORDER — ROCURONIUM BROMIDE 50 MG/5ML IV SOLN
INTRAVENOUS | Status: AC | PRN
  Administered 2018-03-09: 100 mg via INTRAVENOUS

## 2018-03-09 MED ORDER — MIDAZOLAM HCL 2 MG/2ML IJ SOLN: 1.0000 mg | INTRAMUSCULAR | Status: DC | PRN

## 2018-03-09 MED ORDER — SODIUM CHLORIDE 0.9 % IV SOLN
INTRAVENOUS | Status: DC
  Administered 2018-03-09: 13:00:00 via INTRAVENOUS

## 2018-03-09 MED ORDER — SODIUM CHLORIDE 0.9 % IV SOLN
1.0000 ug/kg/min | INTRAVENOUS | Status: DC
  Filled 2018-03-09: qty 20

## 2018-03-09 MED ORDER — HEPARIN SODIUM (PORCINE) 5000 UNIT/ML IJ SOLN: 5000.0000 [IU] | Freq: Three times a day (TID) | INTRAMUSCULAR | Status: DC

## 2018-03-09 MED ORDER — EPINEPHRINE PF 1 MG/10ML IJ SOSY
PREFILLED_SYRINGE | INTRAMUSCULAR | Status: AC | PRN
  Administered 2018-03-09 (×2): 1 mg via INTRAVENOUS

## 2018-03-09 MED ORDER — MIDAZOLAM HCL 50 MG/10ML IJ SOLN
1.0000 mg/h | INTRAMUSCULAR | Status: DC
  Administered 2018-03-09 (×2): 2 mg/h via INTRAVENOUS
  Administered 2018-03-10 – 2018-03-11 (×2): 3 mg/h via INTRAVENOUS
  Administered 2018-03-12: 4 mg/h via INTRAVENOUS
  Administered 2018-03-13: 2 mg/h via INTRAVENOUS
  Filled 2018-03-09 (×6): qty 10

## 2018-03-09 MED ORDER — FENTANYL 2500MCG IN NS 250ML (10MCG/ML) PREMIX INFUSION
25.0000 ug/h | INTRAVENOUS | Status: DC
  Administered 2018-03-09: 200 ug/h via INTRAVENOUS
  Administered 2018-03-10: 100 ug/h via INTRAVENOUS
  Administered 2018-03-10 – 2018-03-11 (×3): 200 ug/h via INTRAVENOUS
  Administered 2018-03-11: 100 ug/h via INTRAVENOUS
  Administered 2018-03-12: 400 ug/h via INTRAVENOUS
  Administered 2018-03-12: 100 ug/h via INTRAVENOUS
  Filled 2018-03-09 (×7): qty 250

## 2018-03-09 MED ORDER — LABETALOL HCL 5 MG/ML IV SOLN
0.5000 mg/min | INTRAVENOUS | Status: DC
  Administered 2018-03-09 – 2018-03-12 (×2): 0.5 mg/min via INTRAVENOUS
  Filled 2018-03-09 (×2): qty 100

## 2018-03-09 MED ORDER — DEXTROSE 5 % IV SOLN
INTRAVENOUS | Status: DC
  Administered 2018-03-09 – 2018-03-15 (×9): via INTRAVENOUS

## 2018-03-09 MED ORDER — SODIUM CHLORIDE 0.9 % IV SOLN
INTRAVENOUS | Status: AC | PRN
  Administered 2018-03-09: 1000 mL via INTRAVENOUS

## 2018-03-09 MED ORDER — INSULIN ASPART 100 UNIT/ML IV SOLN
10.0000 [IU] | Freq: Once | INTRAVENOUS | Status: AC
  Administered 2018-03-09: 10 [IU] via INTRAVENOUS

## 2018-03-09 MED ORDER — INSULIN ASPART 100 UNIT/ML ~~LOC~~ SOLN
10.0000 [IU] | Freq: Once | SUBCUTANEOUS | Status: AC
  Administered 2018-03-09: 10 [IU] via INTRAVENOUS
  Filled 2018-03-09: qty 1

## 2018-03-09 MED ORDER — FENTANYL CITRATE (PF) 100 MCG/2ML IJ SOLN
50.0000 ug | Freq: Once | INTRAMUSCULAR | Status: AC
  Administered 2018-03-09: 50 ug via INTRAVENOUS

## 2018-03-09 MED ORDER — SODIUM CHLORIDE 0.9 % IV SOLN: 250.0000 mL | INTRAVENOUS | Status: DC | PRN

## 2018-03-09 MED ORDER — DEXTROSE 50 % IV SOLN
12.5000 g | Freq: Once | INTRAVENOUS | Status: AC
  Administered 2018-03-09: 12.5 g via INTRAVENOUS
  Filled 2018-03-09: qty 50

## 2018-03-09 MED ORDER — CALCIUM CHLORIDE 10 % IV SOLN
INTRAVENOUS | Status: AC | PRN
Start: 2018-03-09 — End: 2018-03-09
  Administered 2018-03-09: 1 g via INTRAVENOUS

## 2018-03-09 MED ORDER — ORAL CARE MOUTH RINSE
15.0000 mL | OROMUCOSAL | Status: DC
  Administered 2018-03-09 – 2018-03-12 (×30): 15 mL via OROMUCOSAL

## 2018-03-09 MED ORDER — ASPIRIN 81 MG PO CHEW: 324.0000 mg | CHEWABLE_TABLET | ORAL | Status: AC

## 2018-03-09 MED ORDER — HEPARIN SODIUM (PORCINE) 5000 UNIT/ML IJ SOLN
5000.0000 [IU] | Freq: Three times a day (TID) | INTRAMUSCULAR | Status: DC
  Administered 2018-03-09 – 2018-03-24 (×44): 5000 [IU] via SUBCUTANEOUS
  Filled 2018-03-09 (×43): qty 1

## 2018-03-09 MED ORDER — MIDAZOLAM HCL 2 MG/2ML IJ SOLN
2.0000 mg | Freq: Once | INTRAMUSCULAR | Status: AC
  Administered 2018-03-09: 2 mg via INTRAVENOUS

## 2018-03-09 MED ORDER — FAMOTIDINE IN NACL 20-0.9 MG/50ML-% IV SOLN
20.0000 mg | Freq: Two times a day (BID) | INTRAVENOUS | Status: DC
  Administered 2018-03-09 – 2018-03-10 (×4): 20 mg via INTRAVENOUS
  Filled 2018-03-09 (×4): qty 50

## 2018-03-09 MED ORDER — ASPIRIN 300 MG RE SUPP
300.0000 mg | RECTAL | Status: AC
  Administered 2018-03-09: 300 mg via RECTAL
  Filled 2018-03-09: qty 1

## 2018-03-09 MED ORDER — SODIUM CHLORIDE 0.9% FLUSH
10.0000 mL | Freq: Two times a day (BID) | INTRAVENOUS | Status: DC
  Administered 2018-03-09 – 2018-03-15 (×7): 10 mL
  Administered 2018-03-15: 30 mL
  Administered 2018-03-16 – 2018-03-19 (×4): 10 mL
  Administered 2018-03-20: 20 mL

## 2018-03-09 MED ORDER — LABETALOL HCL 5 MG/ML IV SOLN
20.0000 mg | Freq: Once | INTRAVENOUS | Status: AC
  Administered 2018-03-09: 20 mg via INTRAVENOUS
  Filled 2018-03-09: qty 4

## 2018-03-09 MED ORDER — ORAL CARE MOUTH RINSE: 15.0000 mL | Freq: Four times a day (QID) | OROMUCOSAL | Status: DC

## 2018-03-09 MED ORDER — SODIUM BICARBONATE 8.4 % IV SOLN
25.0000 meq | Freq: Once | INTRAVENOUS | Status: AC
  Administered 2018-03-09: 25 meq via INTRAVENOUS
  Filled 2018-03-09: qty 50

## 2018-03-09 NOTE — Consult Note (Signed)
Renal Service Consult Note [facility]  Terez Freimark 03/09/2018 [name] Requesting Physician:  Dr Lynetta Mare  Reason for Consult:  ESRD pt sp arrest HPI: The patient is a 62 y.o. year-old with hx of HTN, combined sdCHF and ESRD on chronic hemodialysis.  Pt presented to ED this am early and reporting SOB, was stable initially then was found on the floor w/o pulses and received CPR/ ALCS resuscitation for under 10 min w/ ROSC.  Admitted to ICU and started cooling protocol this am.  Asked to see for ESRD.    Patient unrespnosive at did not provide any history.       echart recent:  *feb 2019 > htn crisis w/ pulm edema/ vol edema, rx'd with HD, echo w/ g1dd and LA dilated. also afib/ flutter seen by cards not candidate for antiarrhythmics or anticoag due to nonadherence issues.  dc'd on metoprolol 12.5 bid hold am dose on hd days.   *april 2019 > acute resp failure/ pulm edema/ htn crisis, rx bipap and then dialysis  *April 2019 > acute SOB/ resp failure/ pulm edema, admitted and rx'd with HD and vol removal, dry wt was lowered. For HTN lisinopril was added to norvasc/ lopressor. quetsion of dementia, lives at home w daughter who stated that pt has memory problems chronic issue      home meds:  norvasc 10 qd/ lisinopril 2.5 qd/ lopressor 12.5 bid hold am dose on hd days  thiamine/ mvi/ folvite/ ecasa/ prn's/ phoslo ac  ROS  n/a  Past Medical History  Past Medical History:  Diagnosis Date  . Acute CHF ([place]) 01/2018  . Cardiomyopathy secondary    likely related to HTN heart disease; possibly ETOH related as well  . Chronic combined systolic and diastolic heart failure ([facility])    Echocardiogram 09/22/11: Moderate LVH, EF 78-93%, grade 3 diastolic dysfunction, mild MR, moderate to severe LAE, mild RVE, mild to moderate TR, small to moderate pericardial effusion  . ESRD (end stage renal disease) on dialysis [facility])    due to hypertensive nephrosclerosis  . History of  alcohol abuse   . Hypertension    Past Surgical History  Past Surgical History:  Procedure Laterality Date  . AV FISTULA PLACEMENT Left 05/14/2016   Procedure: LEFT ARM BASILIC VEIN TRANSPOSITION;  Surgeon: Rosetta Posner, MD;  Location: [facility];  Service: Vascular;  Laterality: Left;  . PERIPHERAL VASCULAR CATHETERIZATION N/A 05/13/2016   Procedure: Dialysis/Perma Catheter Insertion;  Surgeon: Serafina Mitchell, MD;  Location: [facility];  Service: Cardiovascular;  Laterality: N/A;   Family History  Family History  Problem Relation Age of Onset  . Emphysema Mother   . Cirrhosis Father    Social History  reports that he has been smoking cigars.  He has quit using smokeless tobacco. He reports that he drinks about 18.0 oz of alcohol per week. He reports that he does not use drugs. Allergies No Known Allergies Home medications Prior to Admission medications   Medication Sig Start Date End Date Taking? Authorizing Provider  amLODipine (NORVASC) 10 MG tablet Take 10 mg by mouth at bedtime.   Yes [provider]  aspirin EC 81 MG EC tablet Take 1 tablet (81 mg total) by mouth daily. 02/20/18 03/22/18 Yes Elodia Florence., MD  calcium acetate (PHOSLO) 667 MG capsule Take 1,334-2,001 mg by mouth See admin instructions. 2,001 mg three times a day with meals and 1,334 mg two times a day with snacks  Yes [provider]  diphenhydramine-acetaminophen (TYLENOL PM) 25-500 MG TABS tablet Take 1 tablet by mouth at bedtime as needed (for sleep).   Yes [provider]  lisinopril (PRINIVIL,ZESTRIL) 2.5 MG tablet Take 1 tablet (2.5 mg total) by mouth daily. 02/20/18 03/22/18 Yes Elodia Florence., MD  metoprolol tartrate (LOPRESSOR) 25 MG tablet Take a half tablet (12.5 mg) twice a day. Do not take on mornings of dialysis Patient taking differently: Take 12.5 mg by mouth See admin instructions. Take a half tablet (12.5 mg) twice a day. Do not take on mornings of dialysis  12/04/17  Yes Tawny Asal, MD  folic acid (FOLVITE) 1 MG tablet Take 1 tablet (1 mg total) by mouth daily. Patient not taking: Reported on 02/16/2018 01/28/18   Cristal Ford, DO  Multiple Vitamin (MULTIVITAMIN WITH MINERALS) TABS tablet Take 1 tablet by mouth daily. Patient not taking: Reported on 02/16/2018 01/28/18   Cristal Ford, DO  thiamine 100 MG tablet Take 1 tablet (100 mg total) by mouth daily. Patient not taking: Reported on 02/16/2018 01/28/18   Cristal Ford, DO   Liver Function Tests Recent Labs  Lab 03/09/18 1114  AST 34  ALT 21  ALKPHOS 46  BILITOT 0.5  PROT 6.7  ALBUMIN 3.0*   No results for input(s): LIPASE, AMYLASE in the last 168 hours. CBC Recent Labs  Lab 03/09/18 0517 03/09/18 0708 03/09/18 1114  WBC 8.5  --  15.5*  NEUTROABS 4.4  --   --   HGB 10.9* 10.9* 10.8*  HCT 31.4* 32.0* 31.5*  MCV 96.9  --  96.9  PLT 220  --  160   Basic Metabolic Panel Recent Labs  Lab 03/09/18 0622 03/09/18 0708 03/09/18 1114  NA 135 136 138  K 6.2* 4.3 6.0*  CL 93* 101 101  CO2 24  --  20*  GLUCOSE 80 126* 45*  BUN 54* 52* 60*  CREATININE 11.91* 10.60* 11.89*  CALCIUM 9.6  --  9.5  PHOS  --   --  11.3*   Iron/TIBC/Ferritin/ %Sat    Component Value Date/Time   IRON 51 05/22/2016 0414   TIBC 262 05/22/2016 0414   FERRITIN 113 05/22/2016 0414   IRONPCTSAT 19 05/22/2016 0414    Vitals:   03/09/18 1100 03/09/18 1115 03/09/18 1130 03/09/18 1215  BP:    (!) 175/82  Pulse: (!) 56 60 64 64  Resp: (!) 26 (!) 26 (!) 26 (!) 26  Temp: (!) 95.7 F (35.4 C) (!) 96.4 F (35.8 C) (!) 95.9 F (35.5 C)   TempSrc: Core     SpO2: 91% 94% 93% 100%  Weight:      Height:       Exam Gen on vent unresponsive No rash, cyanosis or gangrene Sclera anicteric, throat w ETT in place  No jvd or bruits Chest some coarse BS, mostly clear RRR no MRG Abd soft ntnd no mass or ascites +bs GU normal male MS no joint effusions or deformity Ext no LE or UE edema, no wounds  or ulcers Neuro is sedated on the vent LUA AVF+bruit    home meds:  norvasc 10 qd/ lisinopril 2.5 qd/ lopressor 12.5 bid hold am dose on hd days  thiamine/ mvi/ folvite/ ecasa/ prn's/ phoslo ac  Dialysis:  [facility] tts  4h 69min  84kg   2/2 bath Hep 2400  LUA AVF - venofer 50 / wk - mircera 75 ug q 2wks, last __ - calcitriol 2.75 ug tiw -  sensipar 90 po tiw   Impression: 1  Dyspnea - pulm edema on CXR 2  Cardiac arrest - in ED this am, ~10 min cpr, now cooling 3  Esrd - usual HD tts 4  HTN - on 3 bp meds at home, bp's soft here on sedation 5  VDRF - acute resp failure 6  Anemia ckd - Hb 10- 11 , get records for esa/ fe 7  Mbd ckd - cont meds when off the vent 8  Hx syst CHF - last echo showed improved LVEF to 65% in Feb 2019 9  Afib / flutter - not on anticoag due to nonadherence; in NSR here per EKG x 2 10  Hyperkalemia - not severe, no ekg changes   Plan - HD today or tonight at bedside  Kelly Splinter MD [facility] pager [phone]   03/09/2018, 1:15 PM

## 2018-03-09 NOTE — Code Documentation (Signed)
Pulse returned

## 2018-03-09 NOTE — Progress Notes (Signed)
CRITICAL VALUE ALERT  Critical Value:  K+ 7.5  Date & Time Notied:  03/09/2018 @1610   Provider Notified: Dr. Lynetta Mare  Orders Received/Actions taken: entering new orders

## 2018-03-09 NOTE — ED Provider Notes (Signed)
[place] EMERGENCY DEPARTMENT Provider Note   CSN: [id number] Arrival date & time: 03/09/18  0455     History   Chief Complaint Chief Complaint  Patient presents with  . Shortness of Breath    HPI Kenneth [name] is a 62 y.o. male.  Patient is a 62 year old male with past medical history of end-stage renal disease on hemodialysis, CHF, hypertension.  He presents today for evaluation of shortness of breath.  He states this started on Saturday and has progressively worsened.  He had his normal dialysis session on Saturday and reports that this was a full session.  He denies fevers, chills, or productive cough.  The history is provided by the patient.  Shortness of Breath  This is a new problem. The average episode lasts 2 days. The problem occurs continuously.The problem has been gradually worsening. Pertinent negatives include no fever, no cough, no sputum production, no chest pain and no leg pain.    Past Medical History:  Diagnosis Date  . Acute CHF ([place]) 01/2018  . Cardiomyopathy secondary    likely related to HTN heart disease; possibly ETOH related as well  . Chronic combined systolic and diastolic heart failure (HCC)    Echocardiogram 09/22/11: Moderate LVH, EF 25-95%, grade 3 diastolic dysfunction, mild MR, moderate to severe LAE, mild RVE, mild to moderate TR, small to moderate pericardial effusion  . ESRD (end stage renal disease) on dialysis [facility])    due to hypertensive nephrosclerosis  . History of alcohol abuse   . Hypertension     Patient Active Problem List   Diagnosis Date Noted  . Acute on chronic respiratory failure ([place]) 02/16/2018  . Tobacco dependence 02/16/2018  . Pulmonary edema 01/26/2018  . Acute respiratory failure with hypoxia ([place]) 01/26/2018  . Atrial fibrillation and flutter ([place])   . Shortness of breath   . Acute on chronic diastolic CHF (congestive heart failure) ([place])   . Hypothermia 10/08/2016  . ESRD on hemodialysis  ([place]) 10/08/2016  . Fall 10/08/2016  . Syncope 10/07/2016  . Near syncope 07/07/2016  . Cardiomyopathy ([place]) 07/07/2016  . Ventricular tachycardia, non-sustained ([place]) 07/06/2016  . Hypoglycemia 07/06/2016  . Alcohol abuse 05/13/2016  . Hypertensive emergency 12/08/2013  . Cardiomyopathy secondary 10/09/2011  . Essential hypertension 10/09/2011  . Alcohol withdrawal delirium ([place]) 09/25/2011  . Chronic combined systolic and diastolic CHF (congestive heart failure) ([place]) 09/24/2011    Past Surgical History:  Procedure Laterality Date  . AV FISTULA PLACEMENT Left 05/14/2016   Procedure: LEFT ARM BASILIC VEIN TRANSPOSITION;  Surgeon: Rosetta Posner, MD;  Location: [place];  Service: Vascular;  Laterality: Left;  . PERIPHERAL VASCULAR CATHETERIZATION N/A 05/13/2016   Procedure: Dialysis/Perma Catheter Insertion;  Surgeon: Serafina Mitchell, MD;  Location: [place] CV LAB;  Service: Cardiovascular;  Laterality: N/A;        Home Medications    Prior to Admission medications   Medication Sig Start Date End Date Taking? Authorizing Provider  amLODipine (NORVASC) 10 MG tablet Take 10 mg by mouth at bedtime.    [provider]  aspirin EC 81 MG EC tablet Take 1 tablet (81 mg total) by mouth daily. 02/20/18 03/22/18  Elodia Florence., MD  calcium acetate (PHOSLO) 667 MG capsule Take 1,334-2,001 mg by mouth See admin instructions. 2,001 mg three times a day with meals and 1,334 mg two times a day with snacks    [provider]  diphenhydramine-acetaminophen (TYLENOL PM) 25-500 MG TABS tablet  Take 1 tablet by mouth at bedtime as needed (for sleep).    [provider]  folic acid (FOLVITE) 1 MG tablet Take 1 tablet (1 mg total) by mouth daily. Patient not taking: Reported on 02/16/2018 01/28/18   Cristal Ford, DO  lisinopril (PRINIVIL,ZESTRIL) 2.5 MG tablet Take 1 tablet (2.5 mg total) by mouth daily. 02/20/18 03/22/18  Elodia Florence., MD  metoprolol tartrate  (LOPRESSOR) 25 MG tablet Take a half tablet (12.5 mg) twice a day. Do not take on mornings of dialysis Patient taking differently: Take 12.5 mg by mouth See admin instructions. Take a half tablet (12.5 mg) twice a day. Do not take on mornings of dialysis 12/04/17   Tawny Asal, MD  Multiple Vitamin (MULTIVITAMIN WITH MINERALS) TABS tablet Take 1 tablet by mouth daily. Patient not taking: Reported on 02/16/2018 01/28/18   Cristal Ford, DO  thiamine 100 MG tablet Take 1 tablet (100 mg total) by mouth daily. Patient not taking: Reported on 02/16/2018 01/28/18   Cristal Ford, DO    Family History Family History  Problem Relation Age of Onset  . Emphysema Mother   . Cirrhosis Father     Social History Social History   Tobacco Use  . Smoking status: Current Every Day Smoker    Types: Cigars  . Smokeless tobacco: Former Network engineer Use Topics  . Alcohol use: Yes    Alcohol/week: 18.0 oz    Types: 30 Cans of beer per week    Frequency: Never    Comment: drinks daily  . Drug use: No     Allergies   Patient has no known allergies.   Review of Systems Review of Systems  Constitutional: Negative for fever.  Respiratory: Positive for shortness of breath. Negative for cough and sputum production.   Cardiovascular: Negative for chest pain.  All other systems reviewed and are negative.    Physical Exam Updated Vital Signs BP (!) 188/97 (BP Location: Right Arm)   Pulse 94   Temp (!) 97.5 F (36.4 C) (Oral)   Resp (!) 25   Ht 5\' 11"  (1.803 m)   Wt 86.2 kg (190 lb)   SpO2 93%   BMI 26.50 kg/m   Physical Exam  Constitutional: He is oriented to person, place, and time. He appears well-developed and well-nourished. No distress.  HENT:  Head: Normocephalic and atraumatic.  Mouth/Throat: Oropharynx is clear and moist.  Neck: Normal range of motion. Neck supple.  Cardiovascular: Normal rate and regular rhythm. Exam reveals no friction rub.  No murmur  heard. Pulmonary/Chest: Effort normal. No respiratory distress. He has no wheezes. He has rales.  Slight rales are audible in the bases bilaterally.  Abdominal: Soft. Bowel sounds are normal. He exhibits no distension. There is no tenderness.  Musculoskeletal: Normal range of motion.       Right lower leg: Normal. He exhibits no edema.       Left lower leg: Normal. He exhibits no edema.  Neurological: He is alert and oriented to person, place, and time. Coordination normal.  Skin: Skin is warm and dry. He is not diaphoretic.  Nursing note and vitals reviewed.    ED Treatments / Results  Labs (all labs ordered are listed, but only abnormal results are displayed) Labs Reviewed  BASIC METABOLIC PANEL  CBC WITH DIFFERENTIAL/PLATELET    EKG EKG Interpretation  Date/Time:  Tuesday Mar 09 2018 05:05:04 EDT Ventricular Rate:  94 PR Interval:    QRS Duration: 100 QT  Interval:  367 QTC Calculation: 459 R Axis:   80 Text Interpretation:  Sinus rhythm Anteroseptal infarct, old Confirmed by Veryl Speak [phone]) on 03/09/2018 5:10:10 AM   Radiology No results found.  Procedures Procedures (including critical care time)  Medications Ordered in ED Medications - No data to display   Initial Impression / Assessment and Plan / ED Course  I have reviewed the triage vital signs and the nursing notes.  Pertinent labs & imaging results that were available during my care of the patient were reviewed by me and considered in my medical decision making (see chart for details).  Patient initially presented here with complaints of shortness of breath.  He was a dialysis patient who was last dialyzed on Saturday and is due to be dialyzed today.  The patient's oxygen saturations were in the low to mid 90s initially upon presentation and he appeared in no respiratory distress but did appear somewhat anxious.    While waiting for his laboratory studies to return, it was brought to my attention that  this patient had become unresponsive and fallen to the floor.  I immediate went to the patient's room along with several members of the nursing staff.  He was lifted from the floor into the stretcher where he was immediately ventilated with bag valve mask.  He initially had faintly palpable pulses, however these soon went away.  CPR was initiated and appropriate cardiac medications administered.  He was also given calcium along with insulin and D50 for presumed hyperkalemia.  After several minutes of chest compressions for what appeared to be PEA, his rhythm changed to a wide-complex tachycardia that appeared regular on the cardiac monitor.  Pulses remained thready.  He was then shocked with 200 J of electricity.  This did not resolve this arrhythmia.  CPR was continued and the patient's rhythm changed shortly thereafter to a sinus rhythm.  Endotracheal intubation was performed using etomidate and rocuronium.  The cords were easily visualized using the glide scope and a 7.5 endotracheal tube was passed.  Tube placement was confirmed with direct visualization, end-tidal CO2, and auscultation over the chest and abdomen.  His blood pressure seemed to stabilize as time went on and his cardiac rhythm remained sinus.  Critical care has been notified of the patient's condition and presence in the emergency department and will assume care.  CRITICAL CARE Performed by: Veryl Speak Total critical care time: 70 minutes Critical care time was exclusive of separately billable procedures and treating other patients. Critical care was necessary to treat or prevent imminent or life-threatening deterioration. Critical care was time spent personally by me on the following activities: development of treatment plan with patient and/or surrogate as well as nursing, discussions with consultants, evaluation of patient's response to treatment, examination of patient, obtaining history from patient or surrogate, ordering and  performing treatments and interventions, ordering and review of laboratory studies, ordering and review of radiographic studies, pulse oximetry and re-evaluation of patient's condition.   Final Clinical Impressions(s) / ED Diagnoses   Final diagnoses:  None    ED Discharge Orders    None       Veryl Speak, MD 03/09/18 [phone]

## 2018-03-09 NOTE — ED Notes (Signed)
IV start x 2 unsuccessful. 

## 2018-03-09 NOTE — Procedures (Signed)
Central Venous Catheter Insertion Procedure Note Kenneth [name] [id number] [birth year]  Procedure: Insertion of Central Venous Catheter Indications: Assessment of intravascular volume and Drug and/or fluid administration  Procedure Details Consent: Unable to obtain consent because of emergent medical necessity. Time Out: Verified patient identification, verified procedure, site/side was marked, verified correct patient position, special equipment/implants available, medications/allergies/relevent history reviewed, required imaging and test results available.  Performed  Maximum sterile technique was used including antiseptics, cap, gloves, gown, hand hygiene, mask and sheet. Skin prep: Chlorhexidine; local anesthetic administered A antimicrobial bonded/coated triple lumen catheter was placed in the right external jugular vein using the Seldinger technique. Ultrasound guidance used.   Evaluation Blood flow good Complications: No apparent complications Patient did tolerate procedure well. Chest X-ray ordered to verify placement.  CXR: pending.  Kenneth [name] 03/09/2018, 12:09 PM

## 2018-03-09 NOTE — Procedures (Signed)
Arterial Catheter Insertion Procedure Note Shermon Bozzi [id number] 03-30-1956  Procedure: Insertion of Arterial Catheter  Indications: Blood pressure monitoring  Procedure Details Consent: Unable to obtain consent because of emergent medical necessity. Time Out: Verified patient identification, verified procedure, site/side was marked, verified correct patient position, special equipment/implants available, medications/allergies/relevent history reviewed, required imaging and test results available.  Performed  Maximum sterile technique was used including antiseptics, cap, gloves, gown, hand hygiene, mask and sheet. Skin prep: Chlorhexidine; local anesthetic administered 20 gauge catheter was inserted into right radial artery using the Seldinger technique. ULTRASOUND GUIDANCE USED: NO Evaluation Blood flow good; BP tracing good. Complications: No apparent complications.  Aline place in PT after code  Sterile procedure was used  RN assisted     Chipper Herb 03/09/2018

## 2018-03-09 NOTE — Code Documentation (Addendum)
Delo intubating. 1st attempt successful. 7.5 ett. Confirmed with color change capnography & ascultation LS.

## 2018-03-09 NOTE — H&P (Signed)
PULMONARY / CRITICAL CARE MEDICINE   Name: Kenneth [name] MRN: [id number] DOB: 27-Mar-1956    ADMISSION DATE:  03/09/2018 CONSULTATION DATE:  03/09/2018  REFERRING MD:  Everlene Balls, [place] ED  CHIEF COMPLAINT:  Status post cardiac arrest.   HISTORY OF PRESENT ILLNESS:   History obtained from chart and ED staff as patient is comatose.  62 year old man with history of ESRD stage IV and CHF with frequent recent admissions for 'flash' pulmonary edema over the last 3 months.  Usually dialyzed TTS at [facility] and attended last session on 5/11.  Presented this morning with worsening dyspnea since last session on Saturday. He denies fevers, chills, or productive cough.  Initially only mild respiratory distress, but then suddenly collapsed while getting up to use commode. CPR initiated for PEA, received empiric treatment for hyperkalemia. Shock x1 for WCT, also given amiodarone. ROSC after 10 mins of ACLS.  PAST MEDICAL HISTORY :  He  has a past medical history of Acute CHF ([place]) (01/2018), Cardiomyopathy secondary, Chronic combined systolic and diastolic heart failure ([place]), ESRD (end stage renal disease) on dialysis [facility]), History of alcohol abuse, and Hypertension.  PAST SURGICAL HISTORY: He  has a past surgical history that includes Cardiac catheterization (N/A, 05/13/2016) and AV fistula placement (Left, 05/14/2016).  No Known Allergies  No current facility-administered medications on file prior to encounter.    Current Outpatient Medications on File Prior to Encounter  Medication Sig  . amLODipine (NORVASC) 10 MG tablet Take 10 mg by mouth at bedtime.  Marland Kitchen aspirin EC 81 MG EC tablet Take 1 tablet (81 mg total) by mouth daily.  . calcium acetate (PHOSLO) 667 MG capsule Take 1,334-2,001 mg by mouth See admin instructions. 2,001 mg three times a day with meals and 1,334 mg two times a day with snacks  . diphenhydramine-acetaminophen (TYLENOL PM) 25-500 MG TABS tablet Take 1 tablet by mouth at  bedtime as needed (for sleep).  Marland Kitchen lisinopril (PRINIVIL,ZESTRIL) 2.5 MG tablet Take 1 tablet (2.5 mg total) by mouth daily.  . metoprolol tartrate (LOPRESSOR) 25 MG tablet Take a half tablet (12.5 mg) twice a day. Do not take on mornings of dialysis (Patient taking differently: Take 12.5 mg by mouth See admin instructions. Take a half tablet (12.5 mg) twice a day. Do not take on mornings of dialysis)  . folic acid (FOLVITE) 1 MG tablet Take 1 tablet (1 mg total) by mouth daily. (Patient not taking: Reported on 02/16/2018)  . Multiple Vitamin (MULTIVITAMIN WITH MINERALS) TABS tablet Take 1 tablet by mouth daily. (Patient not taking: Reported on 02/16/2018)  . thiamine 100 MG tablet Take 1 tablet (100 mg total) by mouth daily. (Patient not taking: Reported on 02/16/2018)    FAMILY HISTORY:  His indicated that his mother is deceased. He indicated that his father is deceased.   SOCIAL HISTORY: He  reports that he has been smoking cigars.  He has quit using smokeless tobacco. He reports that he drinks about 18.0 oz of alcohol per week. He reports that he does not use drugs.  REVIEW OF SYSTEMS:   Unable to perform due tod comatose state.  SUBJECTIVE:  Patient is currently intubated.   VITAL SIGNS: BP (!) 207/113   Pulse 99   Temp (!) 97.5 F (36.4 C) (Oral)   Resp (!) 26   Ht 5\' 11"  (1.803 m)   Wt 190 lb (86.2 kg)   SpO2 100%   BMI 26.50 kg/m   HEMODYNAMICS:  Hypertensive  on no vasoactive medications.   VENTILATOR SETTINGS: Vent Mode: PRVC FiO2 (%):  [100 %] 100 % Set Rate:  [18 bmp-26 bmp] 26 bmp Vt Set:  [610 mL] 610 mL PEEP:  [5 cmH20] 5 cmH20 Plateau Pressure:  [21 cmH20] 21 cmH20  INTAKE / OUTPUT: No intake/output data recorded.  PHYSICAL EXAMINATION: General:  Chronically ill appearing man Neuro:  Intubated. Still under NMB from RSI.  Patient diaphoretic, likely showing some awareness. Pupils 53mm. No response to pain. HEENT:  7.36mm ETT. No tongue laceration. Sclerae  injected.  Cardiovascular:  3/6 SEM. No gallop. Lungs:  Clear bilaterally  Abdomen:  Soft, non tender with no masses. Musculoskeletal:  No active joints.  L forearm fistula with strong thrill. Skin:  Intact.  LABS:  BMET Recent Labs  Lab 03/09/18 0622 03/09/18 0708  NA 135 136  K 6.2* 4.3  CL 93* 101  CO2 24  --   BUN 54* 52*  CREATININE 11.91* 10.60*  GLUCOSE 80 126*    Electrolytes Recent Labs  Lab 03/09/18 0622  CALCIUM 9.6    CBC Recent Labs  Lab 03/09/18 0517 03/09/18 0708  WBC 8.5  --   HGB 10.9* 10.9*  HCT 31.4* 32.0*  PLT 220  --     Coag's No results for input(s): APTT, INR in the last 168 hours.  Sepsis Markers Recent Labs  Lab 03/09/18 0714  LATICACIDVEN 8.79*    ABG Recent Labs  Lab 03/09/18 0748  PHART 7.019*  PCO2ART 75.5*  PO2ART 214.0*    Liver Enzymes No results for input(s): AST, ALT, ALKPHOS, BILITOT, ALBUMIN in the last 168 hours.  Cardiac Enzymes No results for input(s): TROPONINI, PROBNP in the last 168 hours.  Glucose No results for input(s): GLUCAP in the last 168 hours.  Imaging Dg Chest 2 View  Result Date: 03/09/2018 CLINICAL DATA:  Acute onset of worsening shortness of breath and subacute onset of cough. EXAM: CHEST - 2 VIEW COMPARISON:  Chest radiograph performed 02/16/2018 FINDINGS: The lungs are well-aerated. Vascular congestion is noted. Increased interstitial markings may reflect pulmonary edema or possibly pneumonia. There is no evidence of pleural effusion or pneumothorax. The heart is normal in size; the mediastinal contour is within normal limits. No acute osseous abnormalities are seen. IMPRESSION: Vascular congestion noted. Increased interstitial markings may reflect pulmonary edema or possibly pneumonia. Electronically Signed   By: Garald Balding M.D.   On: 03/09/2018 06:13   Dg Chest Port 1 View  Result Date: 03/09/2018 CLINICAL DATA:  Hypoxia EXAM: PORTABLE CHEST 1 VIEW COMPARISON:  Study obtained  earlier in the day FINDINGS: Endotracheal tube tip is 2.3 cm above the carina. Nasogastric tube tip and side port are below the diaphragm. No pneumothorax evident. There is mild interstitial edema. No consolidation. Heart is mildly enlarged with pulmonary venous hypertension. There is aortic atherosclerosis. No adenopathy. There is degenerative change in the left shoulder. IMPRESSION: Tube positions as described without pneumothorax. Pulmonary vascular congestion with mild interstitial edema. Question a degree of congestive heart failure. No consolidation appreciable. Electronically Signed   By: Lowella Grip III M.D.   On: 03/09/2018 07:50     STUDIES:  n/a  CULTURES: n/a  ANTIBIOTICS: none  SIGNIFICANT EVENTS: As above   LINES/TUBES: Right radial arterial line.  DISCUSSION: 62 year old man with ESRD and HFrEF presents with recurrent flash pulmonary edema with cardiac arrest - exact etiology of arrest unclear as is cause of recurrent flash edema.   ASSESSMENT / PLAN:  PULMONARY A: Acute respiratory failure due to acute pulmonary edema. P:   Mechanical ventilation with lung protective strategy. Dialysis for fluid removal. Initiate weaning once awake and fluid overload corrected.  CARDIOVASCULAR A:  Status post PEA arrest, now hypertensive. Likely transient worsening of cardiac function due to hypoxia/hyperkalemia, increase WOB. P:  Resume HF therapy once volume overload corrected.  Rule out ACS.  RENAL A:   Volume overload in context of Stage IV CKD.  Metabolic acidosis.  Hyperkalemia - has corrected with insulin and glucose. P:   Nephrology consulted for urgent IHD via fistula. Patient has sufficient BP.  Dr Moshe Cipro notified.  GASTROINTESTINAL A:   At moderate nutritional risk due to renal disease.  P:   Initiate EN if not extubated in 48h.  HEMATOLOGIC A:   Anemia of CKD.  High DVT risk P:  No transfusion indications. UFH for VTE  prevention.  INFECTIOUS A:   No signs of infection. P:   Monitor for now. No antibiotics  ENDOCRINE A:   Acceptable glycemic control   P:   SSI if necessary.  NEUROLOGIC A:   Comatose following arrest.  May be residual effect of intubation medications.  P:   RASS goal: N/A Initiate TTM if no improvement in next hour.   FAMILY  - Updates: daughter to be updated.   CRITICAL CARE  Performed by: Kipp Brood   Total critical care time: 60 minutes  Critical care time was exclusive of separately billable procedures and treating other patients.  Critical care was necessary to treat or prevent imminent or life-threatening deterioration.  Critical care was time spent personally by me on the following activities: development of treatment plan with patient and/or surrogate as well as nursing, discussions with consultants, evaluation of patient's response to treatment, examination of patient, obtaining history from patient or surrogate, ordering and performing treatments and interventions, ordering and review of laboratory studies, ordering and review of radiographic studies, pulse oximetry and re-evaluation of patient's condition.  Pulmonary and [place] Pager: [phone]  03/09/2018, 8:27 AM

## 2018-03-09 NOTE — Procedures (Signed)
ELECTROENCEPHALOGRAM REPORT  Date of Study: 03/09/18  Patient's Name: Kenneth [name] MRN: [id number] Date of Birth: 07-11-1956  Referring Provider: Kipp Brood Clinical History: 62 y/o with CP arrest, now on cooling protocol  Medications: Scheduled Meds: . chlorhexidine gluconate (MEDLINE KIT)  15 mL Mouth Rinse BID  . Chlorhexidine Gluconate Cloth  6 each Topical Daily  . heparin  5,000 Units Subcutaneous Q8H  . mouth rinse  15 mL Mouth Rinse 10 times per day  . sodium chloride flush  10-40 mL Intracatheter Q12H   Continuous Infusions: . sodium chloride    . sodium chloride    . sodium chloride 10 mL/hr at 03/09/18 1300  . famotidine (PEPCID) IV Stopped (03/09/18 1013)  . fentaNYL infusion INTRAVENOUS 200 mcg/hr (03/09/18 1300)  . labetalol (NORMODYNE) infusion    . midazolam (VERSED) infusion 2 mg/hr (03/09/18 1300)  . norepinephrine (LEVOPHED) Adult infusion Stopped (03/09/18 1245)   PRN Meds:.sodium chloride, Place/Maintain arterial line **AND** sodium chloride, bisacodyl, sennosides, sodium chloride flush   Technical Summary: A multichannel digital EEG recording measured by the international 10-20 system with electrodes applied with paste and impedances below 5000 ohms performed in our laboratory with EKG monitoring in an intubated and sedated patient.  Hyperventilation and photic stimulation were not performed.  The digital EEG was referentially recorded, reformatted, and digitally filtered in a variety of bipolar and referential montages for optimal display.    Description: The patient is intubated and on versed and fentanyl sedation during the recording. There is loss of normal background activity. There is decreased amplitude and suppression of background, although occasional 4-5 hertz activity can be seen.  There is some reactivity when the tech tries to open the patients eyes but no reactivity with protopathic stimulation.    Hyperventilation and photic stimulation were  not performed. There were no epileptiform discharges or electrographic seizures seen.   EKG lead was not well recorded  Impression: This EEG is an abnormal due to background suppression, decreased amplitude and moderate to severe slowing of electrocerebral activity.  This can be found in the setting of anoxic/ischemic injury, toxic/metabolic encephalopathies or due to sedating medication.  There was no epileptiform activity recorded.  A repeat EEG off of sedation may be of value if seizure is among the list of differential diagnoses.

## 2018-03-09 NOTE — Progress Notes (Signed)
  Echocardiogram 2D Echocardiogram has been performed.  Kenneth [name] 03/09/2018, 4:16 PM

## 2018-03-09 NOTE — ED Triage Notes (Signed)
Pt BIB EMS from home. Reports worsening SOB since this afternoon. Cough for past 3weeks since being d/c'd from [facility]. Pt is a TRSa dialysis pt, though reports difficulty making it all the way to Tues. Denies CP, N/V.

## 2018-03-09 NOTE — ED Notes (Signed)
Report attempted 

## 2018-03-09 NOTE — Progress Notes (Signed)
Pt arrived to 2H08 - Code Cool orders enacted. Pt's daughter updated via phone by Dr. Lynetta Mare.  MD at bedside for CL placement.

## 2018-03-09 NOTE — Progress Notes (Signed)
CDS contacted at 1400, received a call back at 1415. Spoke with [employer] and referred patient. Instructed to notify CDS with any change in care plan and with any plans to initiate brain death testing.

## 2018-03-09 NOTE — Progress Notes (Signed)
Transported PT from ED to [facility]

## 2018-03-09 NOTE — ED Notes (Signed)
Patient transported to X-ray 

## 2018-03-09 NOTE — Progress Notes (Signed)
EEG complete - results pending 

## 2018-03-09 NOTE — Progress Notes (Signed)
eLink Physician-Brief Progress Note Patient Name: Kenneth [name] DOB: 08/08/1956 MRN: [id number]   Date of Service  03/09/2018  HPI/Events of Note  Hypoglycemia - Blood glucose = 40.   eICU Interventions  Will order: 1. D5W to run IV at 50 mL/hour.      Intervention Category Intermediate Interventions: Hypovolemia - evaluation and management  [name] 03/09/2018, 8:01 PM

## 2018-03-09 NOTE — Progress Notes (Signed)
Hypoglycemic Event  CBG:  42  Treatment: D50 IV 50 mL  Symptoms: None  Follow-up CBG: Time:2015 CBG Result: na  Possible Reasons for Event: Inadequate meal intake  Comments/MD notified: Elink notified     Burman Blacksmith

## 2018-03-09 NOTE — Code Documentation (Addendum)
Sync cardiovert at 200J for Vtach. Compressions resumed.

## 2018-03-09 NOTE — Code Documentation (Signed)
Pulse check. None noted. Compressions resumed.

## 2018-03-10 ENCOUNTER — Inpatient Hospital Stay (HOSPITAL_COMMUNITY): Payer: Medicare Other

## 2018-03-10 DIAGNOSIS — J9601 Acute respiratory failure with hypoxia: Secondary | ICD-10-CM

## 2018-03-10 LAB — GLUCOSE, CAPILLARY
GLUCOSE-CAPILLARY: 65 mg/dL (ref 65–99)
GLUCOSE-CAPILLARY: 83 mg/dL (ref 65–99)
GLUCOSE-CAPILLARY: 75 mg/dL (ref 65–99)
Glucose-Capillary: 101 mg/dL — ABNORMAL HIGH (ref 65–99)
Glucose-Capillary: 120 mg/dL — ABNORMAL HIGH (ref 65–99)
Glucose-Capillary: 74 mg/dL (ref 65–99)
Glucose-Capillary: 77 mg/dL (ref 65–99)
Glucose-Capillary: 41 mg/dL — CL (ref 65–99)
Glucose-Capillary: 89 mg/dL (ref 65–99)

## 2018-03-10 LAB — BLOOD GAS, ARTERIAL
Acid-Base Excess: 1.5 mmol/L (ref 0.0–2.0)
Bicarbonate: 26.1 mmol/L (ref 20.0–28.0)
Drawn by: 345601
FIO2: 40
LHR: 26 {breaths}/min
MECHVT: 600 mL
O2 Saturation: 98.6 %
PEEP: 8 cmH2O
PO2 ART: 118 mmHg — AB (ref 83.0–108.0)
Patient temperature: 97
pCO2 arterial: 43.7 mmHg (ref 32.0–48.0)
pH, Arterial: 7.39 (ref 7.350–7.450)

## 2018-03-10 LAB — CBC
HCT: 33.1 % — ABNORMAL LOW (ref 39.0–52.0)
Hemoglobin: 11.6 g/dL — ABNORMAL LOW (ref 13.0–17.0)
MCH: 33 pg (ref 26.0–34.0)
MCHC: 35 g/dL (ref 30.0–36.0)
MCV: 94 fL (ref 78.0–100.0)
PLATELETS: 269 10*3/uL (ref 150–400)
RBC: 3.52 MIL/uL — ABNORMAL LOW (ref 4.22–5.81)
RDW: 13.9 % (ref 11.5–15.5)
WBC: 8.9 10*3/uL (ref 4.0–10.5)

## 2018-03-10 LAB — BASIC METABOLIC PANEL
Anion gap: 13 (ref 5–15)
BUN: 18 mg/dL (ref 6–20)
CALCIUM: 8.2 mg/dL — AB (ref 8.9–10.3)
CHLORIDE: 96 mmol/L — AB (ref 101–111)
CO2: 25 mmol/L (ref 22–32)
CREATININE: 4.73 mg/dL — AB (ref 0.61–1.24)
GFR calc Af Amer: 14 mL/min — ABNORMAL LOW (ref >60)
GFR, EST NON AFRICAN AMERICAN: 12 mL/min — AB (ref >60)
Glucose, Bld: 88 mg/dL (ref 65–99)
Potassium: 3.5 mmol/L (ref 3.5–5.1)
SODIUM: 134 mmol/L — AB (ref 135–145)

## 2018-03-10 LAB — TROPONIN I: TROPONIN I: 0.64 ng/mL — AB (ref <0.03)

## 2018-03-10 LAB — PHOSPHORUS: Phosphorus: 3.2 mg/dL (ref 2.5–4.6)

## 2018-03-10 LAB — MAGNESIUM: Magnesium: 1.8 mg/dL (ref 1.7–2.4)

## 2018-03-10 MED ORDER — LIDOCAINE-PRILOCAINE 2.5-2.5 % EX CREA
1.0000 "application " | TOPICAL_CREAM | CUTANEOUS | Status: DC | PRN
  Filled 2018-03-10: qty 5

## 2018-03-10 MED ORDER — PHENYLEPHRINE HCL 10 MG/ML IJ SOLN
0.0000 ug/min | INTRAMUSCULAR | Status: DC
  Administered 2018-03-10: 20 ug/min via INTRAVENOUS
  Filled 2018-03-10 (×2): qty 1

## 2018-03-10 MED ORDER — PRO-STAT SUGAR FREE PO LIQD
60.0000 mL | Freq: Two times a day (BID) | ORAL | Status: DC
  Administered 2018-03-10 – 2018-03-15 (×9): 60 mL
  Filled 2018-03-10 (×8): qty 60

## 2018-03-10 MED ORDER — HEPARIN SODIUM (PORCINE) 1000 UNIT/ML DIALYSIS: 2400.0000 [IU] | Freq: Once | INTRAMUSCULAR | Status: DC

## 2018-03-10 MED ORDER — SODIUM CHLORIDE 0.9 % IV SOLN: 100.0000 mL | INTRAVENOUS | Status: DC | PRN

## 2018-03-10 MED ORDER — PENTAFLUOROPROP-TETRAFLUOROETH EX AERO: 1.0000 "application " | INHALATION_SPRAY | CUTANEOUS | Status: DC | PRN

## 2018-03-10 MED ORDER — SODIUM CHLORIDE 0.9 % IV SOLN
0.0000 ug/min | INTRAVENOUS | Status: DC
  Administered 2018-03-10: 80 ug/min via INTRAVENOUS
  Administered 2018-03-10: 35 ug/min via INTRAVENOUS
  Filled 2018-03-10 (×2): qty 4
  Filled 2018-03-10: qty 40

## 2018-03-10 MED ORDER — OSMOLITE 1.5 CAL PO LIQD
1000.0000 mL | ORAL | Status: DC
  Administered 2018-03-10 – 2018-03-14 (×3): 1000 mL
  Filled 2018-03-10 (×7): qty 1000

## 2018-03-10 NOTE — Progress Notes (Signed)
PULMONARY / CRITICAL CARE MEDICINE   Name: Kenneth [name] MRN: [id number] DOB: 1956-02-07    ADMISSION DATE:  03/09/2018 CONSULTATION DATE:  03/09/2018  REFERRING MD:  Everlene Balls, [place] [facility]  CHIEF COMPLAINT:  Status post cardiac arrest.   HISTORY OF PRESENT ILLNESS:   History obtained from chart and ED staff as patient is comatose.  61 year old man with history of ESRD stage IV and CHF with frequent recent admissions for 'flash' pulmonary edema over the last 3 months.  Usually dialyzed TTS at [facility] and attended last session on 5/11.  Presented this morning with worsening dyspnea since last session on Saturday. He denies fevers, chills, or productive cough.  Initially only mild respiratory distress, but then suddenly collapsed while getting up to use commode. CPR initiated for PEA, received empiric treatment for hyperkalemia. Shock x1 for WCT, also given amiodarone. ROSC after 10 mins of ACLS.  5/14 The patie t is still being cooled at this time externally. He is not arousable presently. He is on Versed 3 mg/hr. He is hemodynamically stable off of pressors   PAST MEDICAL HISTORY :  He  has a past medical history of Acute CHF ([place]) (01/2018), Cardiomyopathy secondary, Chronic combined systolic and diastolic heart failure ([place]), ESRD (end stage renal disease) on dialysis [facility]), History of alcohol abuse, and Hypertension.  PAST SURGICAL HISTORY: He  has a past surgical history that includes Cardiac catheterization (N/A, 05/13/2016) and AV fistula placement (Left, 05/14/2016).  No Known Allergies  No current facility-administered medications on file prior to encounter.    Current Outpatient Medications on File Prior to Encounter  Medication Sig  . amLODipine (NORVASC) 10 MG tablet Take 10 mg by mouth at bedtime.  Marland Kitchen aspirin EC 81 MG EC tablet Take 1 tablet (81 mg total) by mouth daily.  . calcium acetate (PHOSLO) 667 MG capsule Take 1,334-2,001 mg by mouth See admin instructions.  2,001 mg three times a day with meals and 1,334 mg two times a day with snacks  . diphenhydramine-acetaminophen (TYLENOL PM) 25-500 MG TABS tablet Take 1 tablet by mouth at bedtime as needed (for sleep).  Marland Kitchen lisinopril (PRINIVIL,ZESTRIL) 2.5 MG tablet Take 1 tablet (2.5 mg total) by mouth daily.  . metoprolol tartrate (LOPRESSOR) 25 MG tablet Take a half tablet (12.5 mg) twice a day. Do not take on mornings of dialysis (Patient taking differently: Take 12.5 mg by mouth See admin instructions. Take a half tablet (12.5 mg) twice a day. Do not take on mornings of dialysis)  . folic acid (FOLVITE) 1 MG tablet Take 1 tablet (1 mg total) by mouth daily. (Patient not taking: Reported on 02/16/2018)  . Multiple Vitamin (MULTIVITAMIN WITH MINERALS) TABS tablet Take 1 tablet by mouth daily. (Patient not taking: Reported on 02/16/2018)  . thiamine 100 MG tablet Take 1 tablet (100 mg total) by mouth daily. (Patient not taking: Reported on 02/16/2018)    FAMILY HISTORY:  His indicated that his mother is deceased. He indicated that his father is deceased.   SOCIAL HISTORY: He  reports that he has been smoking cigars.  He has quit using smokeless tobacco. He reports that he drinks about 18.0 oz of alcohol per week. He reports that he does not use drugs.  REVIEW OF SYSTEMS:   Unable to perform due tod comatose state.  SUBJECTIVE:  Patient is currently intubated.   VITAL SIGNS: BP 107/67   Pulse 71   Temp (!) 97 F (36.1 C)   Resp (!)  26   Ht 5\' 11"  (1.803 m)   Wt 195 lb 8.8 oz (88.7 kg)   SpO2 99%   BMI 27.27 kg/m   HEMODYNAMICS: CVP:  [4 mmHg-14 mmHg] 6 mmHgHypertensive on no vasoactive medications.   VENTILATOR SETTINGS: Vent Mode: PRVC FiO2 (%):  [40 %-50 %] 40 % Set Rate:  [26 bmp] 26 bmp Vt Set:  [600 mL] 600 mL PEEP:  [8 cmH20] 8 cmH20 Plateau Pressure:  [24 cmH20-25 cmH20] 24 cmH20  INTAKE / OUTPUT: I/O last 3 completed shifts: In: 2348.6 [I.V.:2268.6; NG/GT:30; IV  Piggyback:50] Out: 3500 [Emesis/NG output:200; Other:3005; XFGHW:2993]  PHYSICAL EXAMINATION: General:  Chronically ill appearing man Neuro:  Intubated. Still under NMB from RSI.  Patient diaphoretic, likely showing some awareness. Pupils 39mm. No response to pain. HEENT:  7.58mm ETT. No tongue laceration. Sclerae injected.  Cardiovascular:  3/6 SEM. No gallop. Lungs:  Clear bilaterally  Abdomen:  Soft, non tender with no masses. Musculoskeletal:  No active joints.  L forearm fistula with strong thrill. Skin:  Intact.  LABS:  BMET Recent Labs  Lab 03/09/18 1500  03/09/18 2239 03/09/18 2246 03/10/18 0329  NA 136   < > 137 136 134*  K 7.2*   < > 5.7* 5.5* 3.5  CL 99*  --  100* 103 96*  CO2 19*  --  20*  --  25  BUN 64*  --  67* 56* 18  CREATININE 12.18*  --  13.02* 12.40* 4.73*  GLUCOSE 76   < > 79 78 88   < > = values in this interval not displayed.    Electrolytes Recent Labs  Lab 03/09/18 1114 03/09/18 1500 03/09/18 2239 03/10/18 0329  CALCIUM 9.5 9.3 8.7* 8.2*  MG 2.2  --   --  1.8  PHOS 11.3*  --   --  3.2    CBC Recent Labs  Lab 03/09/18 0517  03/09/18 1114  03/09/18 1730 03/09/18 2246 03/10/18 0329  WBC 8.5  --  15.5*  --   --   --  8.9  HGB 10.9*   < > 10.8*   < > 9.5* 9.2* 11.6*  HCT 31.4*   < > 31.5*   < > 28.0* 27.0* 33.1*  PLT 220  --  232  --   --   --  269   < > = values in this interval not displayed.    Coag's Recent Labs  Lab 03/09/18 1412 03/09/18 2030  INR 1.06 1.15    Sepsis Markers Recent Labs  Lab 03/09/18 0946 03/09/18 1112 03/09/18 1121  LATICACIDVEN 8.26* 1.0 0.87    ABG Recent Labs  Lab 03/09/18 1128 03/09/18 1431 03/10/18 0340  PHART 7.382 7.388 7.390  PCO2ART 34.9 32.8 43.7  PO2ART 72.0* 88.0 118*    Liver Enzymes Recent Labs  Lab 03/09/18 1114  AST 34  ALT 21  ALKPHOS 46  BILITOT 0.5  ALBUMIN 3.0*    Cardiac Enzymes Recent Labs  Lab 03/09/18 1848 03/09/18 2239 03/10/18 0329  TROPONINI  0.76* 0.71* 0.64*    Glucose Recent Labs  Lab 03/09/18 1210 03/09/18 1610 03/09/18 2026 03/10/18 0005 03/10/18 0405  GLUCAP 101* 65 120* 74 77    Imaging Dg Chest Port 1 View  Result Date: 03/09/2018 CLINICAL DATA:  Central line placement EXAM: PORTABLE CHEST 1 VIEW COMPARISON:  03/09/2018 FINDINGS: Right jugular central venous catheter tip in the SVC. Endotracheal tube in good position. NG tube in place. No pneumothorax Improvement in diffuse  bilateral edema since earlier today. Bibasilar atelectasis. No significant effusion IMPRESSION: Satisfactory central line placement Improvement in bilateral pulmonary edema. Bibasilar atelectasis Electronically Signed   By: Franchot Gallo M.D.   On: 03/09/2018 12:43       LINES/TUBES: Right radial arterial line. Right IJ catheter inplace.  DISCUSSION: 62 year old man with ESRD and HFrEF presents with recurrent flash pulmonary edema with cardiac arrest - exact etiology of arrest unclear as is cause of recurrent flash edema.   ASSESSMENT / PLAN:  PULMONARY A: Acute respiratory failure due to acute pulmonary edema. P:   Mechanical ventilation with lung protective strategy. Dialysis for fluid removal. Initiate weaning once awake and fluid overload corrected. 5/14 The patient is about to start dialysis  CARDIOVASCULAR A:  Status post PEA arrest, now hypertensive. Likely transient worsening of cardiac function due to hypoxia/hyperkalemia, increase WOB. P:  Resume HF therapy once volume overload corrected.  Rule out ACS. 5/14 He is in a NSR. His peak troponin was 0.76. No gross ischemic changes on his ECG. Lung fields are pretty clear. There is amll to moderate right pleural effusion.  RENAL A:   Volume overload in context of Stage IV CKD.  Metabolic acidosis.  Hyperkalemia - has corrected with insulin and glucose. P:   Nephrology consulted for urgent IHD via fistula. Patient has sufficient BP.  Dr Moshe Cipro notified.     NEUROLOGIC A:   Comatose following arrest.  May be residual effect of intubation medications.  P:   RASS goal: N/A Initiate TTM if no improvement in next hour. Will have to see how the patient responds over the next 24-48 hours once he is rewarmed.         CRITICAL CARE  Performed by: Lauralyn Primes pulmonary and criticalcare   Total critical care time: 60 minutes  Critical care time was exclusive of separately billable procedures and treating other patients.  Critical care was necessary to treat or prevent imminent or life-threatening deterioration.  Critical care was time spent personally by me on the following activities: development of treatment plan with patient and/or surrogate as well as nursing, discussions with consultants, evaluation of patient's response to treatment, examination of patient, obtaining history from patient or surrogate, ordering and performing treatments and interventions, ordering and review of laboratory studies, ordering and review of radiographic studies, pulse oximetry and re-evaluation of patient's condition.  Pulmonary and [place] Pager: [phone]  03/10/2018, 10:06 AM

## 2018-03-10 NOTE — Care Management Note (Signed)
Case Management Note Marvetta Gibbons RN,BSN Unit [facility] 1-22 Case Manager  [phone]  Patient Details  Name: Kenneth [name] MRN: [id number] Date of Birth: 1956/05/29  Subjective/Objective:  Pt admitted s/p cardiac arrest, hx ERSD- HD TTS at [facility]                 Action/Plan: PTA pt lived at home, was active with [facility] for HHRN/PT- CM to follow for transition of care needs- pt currently on vent- will need PT eval when medically ready for recommendations   Expected Discharge Date:                  Expected Discharge Plan:     In-House Referral:     Discharge planning Services  CM Consult  Post Acute Care Choice:  Home Health, Resumption of Svcs/PTA Provider Choice offered to:     DME Arranged:    DME Agency:     HH Arranged:  RN, PT [place] Agency:  [place]  Status of Service:  In process, will continue to follow  If discussed at Long Length of Stay Meetings, dates discussed:    Discharge Disposition:   Additional Comments:  Dawayne Patricia, RN 03/10/2018, 10:49 AM

## 2018-03-10 NOTE — Progress Notes (Signed)
eLink Physician-Brief Progress Note Patient Name: Kenneth [name] DOB: April 28, 1956 MRN: [id number]   Date of Service  03/10/2018  HPI/Events of Note  Hypotension - While on HD. MAP dipping to 63 in this patient who is being cooled therapeutically. Nephrology wants to try to pull off 4 liters.   eICU Interventions  Will order: 1. Phenylephrine IV infusion. Titrate to MAP > 70.     Intervention Category Major Interventions: Hypotension - evaluation and management  Agustine Rossitto Eugene 03/10/2018, 12:34 AM

## 2018-03-10 NOTE — Plan of Care (Signed)
  Problem: Cardiac: Goal: Ability to achieve and maintain adequate cardiopulmonary perfusion will improve Outcome: Progressing Note:  Within parameters   Problem: Neurologic: Goal: Promote progressive neurologic recovery Outcome: Progressing Note:  Following commands at times   Problem: Skin Integrity: Goal: Risk for impaired skin integrity will be minimized. Outcome: Progressing

## 2018-03-10 NOTE — Progress Notes (Signed)
Initial Nutrition Assessment  DOCUMENTATION CODES:   Not applicable  INTERVENTION:   Start Osmolite 1.5 @ 45 ml/hr (1080 ml/day) via OG tube 60 ml Prostat BID  Provides: 2020 kcal, 127 grams protein, and 822 ml free water.    NUTRITION DIAGNOSIS:   Increased nutrient needs related to (dialysis ) as evidenced by estimated needs.  GOAL:   Patient will meet greater than or equal to 90% of their needs  MONITOR:   TF tolerance, Labs  REASON FOR ASSESSMENT:   Consult Enteral/tube feeding initiation and management  ASSESSMENT:   Pt with PMH of ETOH abuse, HTN, CHF, ESRD on HD TTS admitted with SOB and had a PEA arrest in ED. Pt started on 36 degree protocol.    Spoke with RN and MD, will remain intubated. Start TF today.   Pt lives with daughter, spoke with her at bedside.  She reports no recent weight changes, good appetite, pt eats what he wants. According to daughter pt gets a couple of 40's per month. These last him a couple of days but is no longer drinking more than this. He used to be a heavy drinker but cut back after starting HD.   Patient is currently intubated on ventilator support MV: 15 L/min Temp (24hrs), Avg:96.9 F (36.1 C), Min:95.7 F (35.4 C), Max:98.3 F (36.8 C)  Medications reviewed and include: pepcid, neo 30 mcg/min D5 @ 50 ml/hr Labs reviewed: Na 134 (L), potassium (7.2 down to 3.5), magnesium, and phosphors (11.3 down to 3.2) are all WNL  Pt is negative 4.1 L since admission  NUTRITION - FOCUSED PHYSICAL EXAM:    Most Recent Value  Orbital Region  No depletion  Upper Arm Region  No depletion  Thoracic and Lumbar Region  Unable to assess  Buccal Region  No depletion  Temple Region  No depletion  Clavicle Bone Region  No depletion  Clavicle and Acromion Bone Region  No depletion  Scapular Bone Region  Unable to assess  Dorsal Hand  No depletion  Patellar Region  No depletion  Anterior Thigh Region  No depletion  Posterior Calf Region   No depletion  Edema (RD Assessment)  None  Hair  Reviewed  Eyes  Unable to assess  Mouth  Unable to assess  Skin  Reviewed  Nails  Reviewed       Diet Order:   Diet Order           Diet NPO time specified  Diet effective now          EDUCATION NEEDS:   No education needs have been identified at this time  Skin:  Skin Assessment: Reviewed RN Assessment  Last BM:  1300 ml via flexiseal   Height:   Ht Readings from Last 1 Encounters:  03/09/18 5\' 11"  (1.803 m)    Weight:   Wt Readings from Last 1 Encounters:  03/10/18 201 lb 4.5 oz (91.3 kg)    Ideal Body Weight:  78.1 kg  BMI:  Body mass index is 28.07 kg/m.  Estimated Nutritional Needs:   Kcal:  2033  Protein:  106-124  Fluid:  1.2 L/day  Maylon Peppers RD, LDN, CNSC [phone] Pager [phone] After Hours Pager

## 2018-03-10 NOTE — Progress Notes (Signed)
[facility] Progress Note  Subjective: 3.0 L off overnight w HD, on HD again now.    Vitals:   03/10/18 1200 03/10/18 1215 03/10/18 1230 03/10/18 1245  BP: (!) 103/58 (!) 94/59 (!) 89/67 110/70  Pulse: 82 82 80 78  Resp: 19 (!) 23 (!) 26 (!) 24  Temp: (!) 97 F (36.1 C)     TempSrc: Core     SpO2: 100% 100% 100% 100%  Weight:      Height:        Inpatient medications: . chlorhexidine gluconate (MEDLINE KIT)  15 mL Mouth Rinse BID  . Chlorhexidine Gluconate Cloth  6 each Topical Daily  . heparin  2,400 Units Dialysis Once in dialysis  . heparin  5,000 Units Subcutaneous Q8H  . mouth rinse  15 mL Mouth Rinse 10 times per day  . sodium chloride flush  10-40 mL Intracatheter Q12H   . sodium chloride Stopped (03/09/18 2142)  . sodium chloride    . sodium chloride Stopped (03/09/18 2142)  . dextrose 50 mL/hr at 03/10/18 1200  . famotidine (PEPCID) IV Stopped (03/10/18 0917)  . fentaNYL infusion INTRAVENOUS 200 mcg/hr (03/10/18 1200)  . labetalol (NORMODYNE) infusion Stopped (03/09/18 1957)  . midazolam (VERSED) infusion 3 mg/hr (03/10/18 1200)  . norepinephrine (LEVOPHED) Adult infusion Stopped (03/09/18 1245)  . phenylephrine (NEO-SYNEPHRINE) Adult infusion 20 mcg/min (03/10/18 1238)   sodium chloride, Place/Maintain arterial line **AND** sodium chloride, bisacodyl, sennosides, sodium chloride flush  Exam: Gen on vent unresponsive No rash, cyanosis or gangrene Sclera anicteric, throat w ETT in place  No jvd or bruits Chest some coarse BS, mostly clear RRR no MRG Abd soft ntnd no mass or ascites +bs GU normal male MS no joint effusions or deformity Ext no LE or UE edema, no wounds or ulcers Neuro is sedated on the vent LUA AVF+bruit    home meds:  norvasc 10 qd/ lisinopril 2.5 qd/ lopressor 12.5 bid hold am dose on hd days  thiamine/ mvi/ folvite/ ecasa/ prn's/ phoslo ac   Dialysis: [facility] tts  4h 15min  84kg   2/2 bath Hep 2400  LUA AVF - venofer 50  / wk - mircera 75 ug q 2wks - calcitriol 2.75 ug tiw - sensipar 90 po tiw   Impression: 1  acute resp failure - on the vent, severe pulm edema on presenting CXR 2  Cardiac PEA arrest - cooling protocol to finish today 3  Esrd - usual HD tts 4  HTN - on 3 bp meds at home, bp's soft here 5  Vol overload - chronic issue w/ this patient, was 10kg up on admission 6  Anemia ckd - Hb 10- 11 , get records for esa/ fe 7  Mbd ckd - cont meds when off the vent 8  Hx syst CHF - last echo showed improved LVEF to 65% in Feb 2019 9  Afib / flutter - not on anticoag due to hx nonadherence; NSR now per EKG x 2 10  Hyperkalemia - not severe, resolved   Plan - HD again today to get vol down further   Rob  MD [facility] pager [phone]   03/10/2018, 12:47 PM   Recent Labs  Lab 03/09/18 1114  03/09/18 1500  03/09/18 2239 03/09/18 2246 03/10/18 0329  NA 138   < > 136   < > 137 136 134*  K 6.0*   < > 7.2*   < > 5.7* 5.5* 3.5  CL 101   < >   99*  --  100* 103 96*  CO2 20*  --  19*  --  20*  --  25  GLUCOSE 45*   < > 76   < > 79 78 88  BUN 60*   < > 64*  --  67* 56* 18  CREATININE 11.89*   < > 12.18*  --  13.02* 12.40* 4.73*  CALCIUM 9.5  --  9.3  --  8.7*  --  8.2*  PHOS 11.3*  --   --   --   --   --  3.2   < > = values in this interval not displayed.   Recent Labs  Lab 03/09/18 1114  AST 34  ALT 21  ALKPHOS 46  BILITOT 0.5  PROT 6.7  ALBUMIN 3.0*   Recent Labs  Lab 03/09/18 0517  03/09/18 1114  03/09/18 1730 03/09/18 2246 03/10/18 0329  WBC 8.5  --  15.5*  --   --   --  8.9  NEUTROABS 4.4  --   --   --   --   --   --   HGB 10.9*   < > 10.8*   < > 9.5* 9.2* 11.6*  HCT 31.4*   < > 31.5*   < > 28.0* 27.0* 33.1*  MCV 96.9  --  96.9  --   --   --  94.0  PLT 220  --  232  --   --   --  269   < > = values in this interval not displayed.   Iron/TIBC/Ferritin/ %Sat    Component Value Date/Time   IRON 51 05/22/2016 0414   TIBC 262 05/22/2016 0414    FERRITIN 113 05/22/2016 0414   IRONPCTSAT 19 05/22/2016 0414

## 2018-03-10 NOTE — Progress Notes (Signed)
eLink Physician-Brief Progress Note Patient Name: Kenneth [name] DOB: 1955/11/19 MRN: [id number]   Date of Service  03/10/2018  HPI/Events of Note  K+ = 3.50 Patient has ESRD and is on HD.   eICU Interventions  No indication to replace K+ in this patient with ESRD.     Intervention Category Major Interventions: Electrolyte abnormality - evaluation and management  Tyanne Derocher Eugene 03/10/2018, 6:08 AM

## 2018-03-11 LAB — POCT I-STAT 3, ART BLOOD GAS (G3+)
ACID-BASE EXCESS: 2 mmol/L (ref 0.0–2.0)
Acid-Base Excess: 4 mmol/L — ABNORMAL HIGH (ref 0.0–2.0)
BICARBONATE: 27.5 mmol/L (ref 20.0–28.0)
Bicarbonate: 29.4 mmol/L — ABNORMAL HIGH (ref 20.0–28.0)
O2 Saturation: 95 %
O2 Saturation: 94 %
PH ART: 7.42 (ref 7.350–7.450)
PO2 ART: 76 mmHg — AB (ref 83.0–108.0)
Patient temperature: 36.8
TCO2: 29 mmol/L (ref 22–32)
TCO2: 31 mmol/L (ref 22–32)
pCO2 arterial: 43 mmHg (ref 32.0–48.0)
pCO2 arterial: 45.4 mmHg (ref 32.0–48.0)
pH, Arterial: 7.413 (ref 7.350–7.450)
pO2, Arterial: 69 mmHg — ABNORMAL LOW (ref 83.0–108.0)

## 2018-03-11 LAB — CBC
HCT: 33.6 % — ABNORMAL LOW (ref 39.0–52.0)
Hemoglobin: 11.3 g/dL — ABNORMAL LOW (ref 13.0–17.0)
MCH: 32.8 pg (ref 26.0–34.0)
MCHC: 33.6 g/dL (ref 30.0–36.0)
MCV: 97.7 fL (ref 78.0–100.0)
PLATELETS: 225 10*3/uL (ref 150–400)
RBC: 3.44 MIL/uL — ABNORMAL LOW (ref 4.22–5.81)
RDW: 14.2 % (ref 11.5–15.5)
WBC: 9.3 10*3/uL (ref 4.0–10.5)

## 2018-03-11 LAB — GLUCOSE, CAPILLARY
GLUCOSE-CAPILLARY: 92 mg/dL (ref 65–99)
Glucose-Capillary: 104 mg/dL — ABNORMAL HIGH (ref 65–99)
Glucose-Capillary: 109 mg/dL — ABNORMAL HIGH (ref 65–99)
Glucose-Capillary: 159 mg/dL — ABNORMAL HIGH (ref 65–99)
Glucose-Capillary: 69 mg/dL (ref 65–99)
Glucose-Capillary: 78 mg/dL (ref 65–99)

## 2018-03-11 LAB — PHOSPHORUS
Phosphorus: 5.4 mg/dL — ABNORMAL HIGH (ref 2.5–4.6)
Phosphorus: 5.5 mg/dL — ABNORMAL HIGH (ref 2.5–4.6)

## 2018-03-11 LAB — BASIC METABOLIC PANEL
Anion gap: 10 (ref 5–15)
BUN: 16 mg/dL (ref 6–20)
CO2: 27 mmol/L (ref 22–32)
Calcium: 8.1 mg/dL — ABNORMAL LOW (ref 8.9–10.3)
Chloride: 96 mmol/L — ABNORMAL LOW (ref 101–111)
Creatinine, Ser: 5.94 mg/dL — ABNORMAL HIGH (ref 0.61–1.24)
GFR calc Af Amer: 10 mL/min — ABNORMAL LOW (ref >60)
GFR, EST NON AFRICAN AMERICAN: 9 mL/min — AB (ref >60)
GLUCOSE: 148 mg/dL — AB (ref 65–99)
Potassium: 4.9 mmol/L (ref 3.5–5.1)
SODIUM: 133 mmol/L — AB (ref 135–145)

## 2018-03-11 LAB — MAGNESIUM
MAGNESIUM: 1.8 mg/dL (ref 1.7–2.4)
MAGNESIUM: 1.8 mg/dL (ref 1.7–2.4)

## 2018-03-11 MED ORDER — DEXTROSE 50 % IV SOLN
50.0000 mL | Freq: Once | INTRAVENOUS | Status: AC
  Administered 2018-03-11: 50 mL via INTRAVENOUS

## 2018-03-11 MED ORDER — FAMOTIDINE IN NACL 20-0.9 MG/50ML-% IV SOLN
20.0000 mg | Freq: Every day | INTRAVENOUS | Status: DC
  Administered 2018-03-11 – 2018-03-25 (×15): 20 mg via INTRAVENOUS
  Filled 2018-03-11 (×15): qty 50

## 2018-03-11 MED ORDER — DEXTROSE 50 % IV SOLN
INTRAVENOUS | Status: AC
  Filled 2018-03-11: qty 50

## 2018-03-11 NOTE — Progress Notes (Signed)
CBG 69. 1 amp D50 given per protocol.  Repeat CBG 157. Pt continues to receive D5 gtt and is on tube feeding.  Will continue to closely monitor.

## 2018-03-11 NOTE — Progress Notes (Signed)
[facility] Progress Note  Subjective: 2.7 L off w HD yest , CVP 7 today  Vitals:   03/11/18 0800 03/11/18 0900 03/11/18 1000 03/11/18 1100  BP: 99/62 (!) 95/51 114/76 120/78  Pulse: 84 84 83 86  Resp: (!) 26 (!) 26 (!) 25 (!) 26  Temp:      TempSrc:      SpO2: 100% 100% 99% 97%  Weight:      Height:        Inpatient medications: . chlorhexidine gluconate (MEDLINE KIT)  15 mL Mouth Rinse BID  . Chlorhexidine Gluconate Cloth  6 each Topical Daily  . dextrose      . feeding supplement (PRO-STAT SUGAR FREE 64)  60 mL Per Tube BID  . heparin  2,400 Units Dialysis Once in dialysis  . heparin  5,000 Units Subcutaneous Q8H  . mouth rinse  15 mL Mouth Rinse 10 times per day  . sodium chloride flush  10-40 mL Intracatheter Q12H   . sodium chloride Stopped (03/09/18 2142)  . sodium chloride    . sodium chloride Stopped (03/09/18 2142)  . dextrose 50 mL/hr at 03/11/18 0436  . famotidine (PEPCID) IV    . feeding supplement (OSMOLITE 1.5 CAL) 45 mL/hr at 03/10/18 1800  . fentaNYL infusion INTRAVENOUS Stopped (03/11/18 1000)  . labetalol (NORMODYNE) infusion Stopped (03/09/18 1957)  . midazolam (VERSED) infusion Stopped (03/11/18 1000)  . norepinephrine (LEVOPHED) Adult infusion Stopped (03/09/18 1245)  . phenylephrine (NEO-SYNEPHRINE) Adult infusion 25 mcg/min (03/11/18 0745)   sodium chloride, Place/Maintain arterial line **AND** sodium chloride, bisacodyl, sennosides, sodium chloride flush  Exam: Gen on vent unresponsive Sclera anicteric, throat w ETT in place  No jvd or bruits Chest some coarse BS, mostly clear RRR no MRG Abd soft ntnd no mass or ascites +bs MS no joint effusions or deformity Ext no LE or UE edema Neuro is sedated on the vent LUA AVF+bruit    home meds:  norvasc 10 qd/ lisinopril 2.5 qd/ lopressor 12.5 bid hold am dose on hd days  thiamine/ mvi/ folvite/ ecasa/ prn's/ phoslo ac   Dialysis: [facility] tts  4h 72mn  84kg   2/2 bath Hep 2400   LUA AVF - venofer 50 / wk - mircera 75 ug q 2wks - calcitriol 2.75 ug tiw - sensipar 90 po tiw   Impression: 1  Acute resp failure - on the vent, severe pulm edema on presenting CXR 2  SP PEA arrest - in ED day of admission; sp cooling protocol  3  Esrd - had full HD x 2 this week on 5/14 and 5/15, next HD Fri 4  HTN - home meds on hold, bp's soft 5  Vol overload - chronic issue w/ this patient, still up 6kg by wts 6  Anemia ckd - Hb 10- 11 , get records for esa/ fe 7  Mbd ckd - cont meds when off the vent 8  Hx syst CHF - last echo showed improved LVEF to 65% in Feb 2019 9  Afib / flutter - not on anticoag due to hx nonadherence; NSR now per EKG x 2   Plan - HD Friday, further volume removal   RKelly SplinterMD [facility] pager [phone]  03/11/2018, 12:30 PM   Recent Labs  Lab 03/09/18 1114  03/09/18 2239 03/09/18 2246 03/10/18 0329 03/11/18 0212  NA 138   < > 137 136 134* 133*  K 6.0*   < > 5.7* 5.5* 3.5 4.9  CL 101   < > 100* 103 96* 96*  CO2 20*   < > 20*  --  25 27  GLUCOSE 45*   < > 79 78 88 148*  BUN 60*   < > 67* 56* 18 16  CREATININE 11.89*   < > 13.02* 12.40* 4.73* 5.94*  CALCIUM 9.5   < > 8.7*  --  8.2* 8.1*  PHOS 11.3*  --   --   --  3.2 5.4*   < > = values in this interval not displayed.   Recent Labs  Lab 03/09/18 1114  AST 34  ALT 21  ALKPHOS 46  BILITOT 0.5  PROT 6.7  ALBUMIN 3.0*   Recent Labs  Lab 03/09/18 0517  03/09/18 1114  03/09/18 2246 03/10/18 0329 03/11/18 0212  WBC 8.5  --  15.5*  --   --  8.9 9.3  NEUTROABS 4.4  --   --   --   --   --   --   HGB 10.9*   < > 10.8*   < > 9.2* 11.6* 11.3*  HCT 31.4*   < > 31.5*   < > 27.0* 33.1* 33.6*  MCV 96.9  --  96.9  --   --  94.0 97.7  PLT 220  --  232  --   --  269 225   < > = values in this interval not displayed.   Iron/TIBC/Ferritin/ %Sat    Component Value Date/Time   IRON 51 05/22/2016 0414   TIBC 262 05/22/2016 0414   FERRITIN 113 05/22/2016 0414    IRONPCTSAT 19 05/22/2016 0414

## 2018-03-11 NOTE — Progress Notes (Signed)
PULMONARY / CRITICAL CARE MEDICINE   Name: Kenneth [name] MRN: [id number] DOB: 01-02-1956    ADMISSION DATE:  03/09/2018 CONSULTATION DATE:  03/09/2018  REFERRING MD:  Everlene Balls, [place] [facility]  CHIEF COMPLAINT:  Status post cardiac arrest.   HISTORY OF PRESENT ILLNESS:   History obtained from chart and ED staff as patient is comatose.  62 year old man with history of ESRD stage IV and CHF with frequent recent admissions for 'flash' pulmonary edema over the last 3 months.  Usually dialyzed TTS at [facility] and attended last session on 5/11.  Presented this morning with worsening dyspnea since last session on Saturday. He denies fevers, chills, or productive cough.  Initially only mild respiratory distress, but then suddenly collapsed while getting up to use commode. CPR initiated for PEA, received empiric treatment for hyperkalemia. Shock x1 for WCT, also given amiodarone. ROSC after 10 mins of ACLS.  5/15 The patie t is still being cooled at this time externally. He is not arousable presently. He is on Versed 3 mg/hr. He is hemodynamically stable off of pressors  5/16 The patient will open eyes briefly when called he raised his RUE. His sedation was just turned off. He remains on  asmall dose of neosynephrine. He got dialyzed yesterday and willresume in all likelhood Saturday.   PAST MEDICAL HISTORY :  He  has a past medical history of Acute CHF ([place]) (01/2018), Cardiomyopathy secondary, Chronic combined systolic and diastolic heart failure ([place]), ESRD (end stage renal disease) on dialysis [facility]), History of alcohol abuse, and Hypertension.  PAST SURGICAL HISTORY: He  has a past surgical history that includes Cardiac catheterization (N/A, 05/13/2016) and AV fistula placement (Left, 05/14/2016).  No Known Allergies  No current facility-administered medications on file prior to encounter.    Current Outpatient Medications on File Prior to Encounter  Medication Sig  . amLODipine  (NORVASC) 10 MG tablet Take 10 mg by mouth at bedtime.  Marland Kitchen aspirin EC 81 MG EC tablet Take 1 tablet (81 mg total) by mouth daily.  . calcium acetate (PHOSLO) 667 MG capsule Take 1,334-2,001 mg by mouth See admin instructions. 2,001 mg three times a day with meals and 1,334 mg two times a day with snacks  . diphenhydramine-acetaminophen (TYLENOL PM) 25-500 MG TABS tablet Take 1 tablet by mouth at bedtime as needed (for sleep).  Marland Kitchen lisinopril (PRINIVIL,ZESTRIL) 2.5 MG tablet Take 1 tablet (2.5 mg total) by mouth daily.  . metoprolol tartrate (LOPRESSOR) 25 MG tablet Take a half tablet (12.5 mg) twice a day. Do not take on mornings of dialysis (Patient taking differently: Take 12.5 mg by mouth See admin instructions. Take a half tablet (12.5 mg) twice a day. Do not take on mornings of dialysis)  . folic acid (FOLVITE) 1 MG tablet Take 1 tablet (1 mg total) by mouth daily. (Patient not taking: Reported on 02/16/2018)  . Multiple Vitamin (MULTIVITAMIN WITH MINERALS) TABS tablet Take 1 tablet by mouth daily. (Patient not taking: Reported on 02/16/2018)  . thiamine 100 MG tablet Take 1 tablet (100 mg total) by mouth daily. (Patient not taking: Reported on 02/16/2018)    FAMILY HISTORY:  His indicated that his mother is deceased. He indicated that his father is deceased.   SOCIAL HISTORY: He  reports that he has been smoking cigars.  He has quit using smokeless tobacco. He reports that he drinks about 18.0 oz of alcohol per week. He reports that he does not use drugs.  REVIEW OF  SYSTEMS:   Unable to perform due tod comatose state.  SUBJECTIVE:  Patient is currently intubated.   VITAL SIGNS: BP 114/76   Pulse 83   Temp 98.5 F (36.9 C) (Oral)   Resp (!) 25   Ht 5\' 11"  (1.803 m)   Wt 199 lb 12.1 oz (90.6 kg)   SpO2 99%   BMI 27.86 kg/m   HEMODYNAMICS: CVP:  [5 mmHg-7 mmHg] 7 mmHgHypertensive on no vasoactive medications.   VENTILATOR SETTINGS: Vent Mode: PRVC FiO2 (%):  [40 %] 40 % Set  Rate:  [26 bmp] 26 bmp Vt Set:  [600 mL] 600 mL PEEP:  [8 cmH20] 8 cmH20 Plateau Pressure:  [24 cmH20-26 cmH20] 26 cmH20  INTAKE / OUTPUT: I/O last 3 completed shifts: In: 3721.5 [I.V.:2988; NG/GT:583.5; IV Piggyback:150] Out: 9833 [Emesis/NG output:200; Other:5777]  PHYSICAL EXAMINATION: General:  Chronically ill appearing man Neuro:  Intubated. Still under NMB from RSI.  Patient diaphoretic, likely showing some awareness. Pupils 41mm. No response to pain. HEENT:  7.65mm ETT. No tongue laceration. Sclerae injected.  Cardiovascular:  3/6 SEM. No gallop. Lungs:  Clear bilaterally  Abdomen:  Soft, non tender with no masses. Musculoskeletal:  No active joints.  L forearm fistula with strong thrill. Skin:  Intact.  LABS:  BMET Recent Labs  Lab 03/09/18 2239 03/09/18 2246 03/10/18 0329 03/11/18 0212  NA 137 136 134* 133*  K 5.7* 5.5* 3.5 4.9  CL 100* 103 96* 96*  CO2 20*  --  25 27  BUN 67* 56* 18 16  CREATININE 13.02* 12.40* 4.73* 5.94*  GLUCOSE 79 78 88 148*    Electrolytes Recent Labs  Lab 03/09/18 1114  03/09/18 2239 03/10/18 0329 03/11/18 0212  CALCIUM 9.5   < > 8.7* 8.2* 8.1*  MG 2.2  --   --  1.8 1.8  PHOS 11.3*  --   --  3.2 5.4*   < > = values in this interval not displayed.    CBC Recent Labs  Lab 03/09/18 1114  03/09/18 2246 03/10/18 0329 03/11/18 0212  WBC 15.5*  --   --  8.9 9.3  HGB 10.8*   < > 9.2* 11.6* 11.3*  HCT 31.5*   < > 27.0* 33.1* 33.6*  PLT 232  --   --  269 225   < > = values in this interval not displayed.    Coag's Recent Labs  Lab 03/09/18 1412 03/09/18 2030  INR 1.06 1.15    Sepsis Markers Recent Labs  Lab 03/09/18 0946 03/09/18 1112 03/09/18 1121  LATICACIDVEN 8.26* 1.0 0.87    ABG Recent Labs  Lab 03/09/18 1431 03/10/18 0340 03/11/18 0804  PHART 7.388 7.390 7.413  PCO2ART 32.8 43.7 43.0  PO2ART 88.0 118* 76.0*    Liver Enzymes Recent Labs  Lab 03/09/18 1114  AST 34  ALT 21  ALKPHOS 46  BILITOT  0.5  ALBUMIN 3.0*    Cardiac Enzymes Recent Labs  Lab 03/09/18 1848 03/09/18 2239 03/10/18 0329  TROPONINI 0.76* 0.71* 0.64*    Glucose Recent Labs  Lab 03/10/18 1556 03/10/18 2027 03/11/18 0012 03/11/18 0152 03/11/18 0433 03/11/18 0801  GLUCAP 83 75 69 159* 78 104*    Imaging No results found.     LINES/TUBES: Right radial arterial line. Right IJ catheter inplace.  DISCUSSION: 62 year old man with ESRD and HFrEF presents with recurrent flash pulmonary edema with cardiac arrest - exact etiology of arrest unclear as is cause of recurrent flash edema.  ASSESSMENT / PLAN:  PULMONARY A: Acute respiratory failure due to acute pulmonary edema. P:   Mechanical ventilation with lung protective strategy. Dialysis for fluid removal. Initiate weaning once awake and fluid overload corrected. 5/15 The patient is about to start dialysis 5/16 The patient is lying in semi-fowlers. He appears conmfortable. May be able to try tim on SBT once he wakes up a little more now that sedationis off. At present he is on FI02 30% and PEEP of 5  CARDIOVASCULAR A:  Status post PEA arrest, now hypertensive. Likely transient worsening of cardiac function due to hypoxia/hyperkalemia, increase WOB. P:  Resume HF therapy once volume overload corrected.  Rule out ACS. 5/14 He is in a NSR. His peak troponin was 0.76. No gross ischemic changes on his ECG. Lung fields are pretty clear. There is amll to moderate right pleural effusion. 5/16 Remains in NSR No evidence of arrhtytmia No major evidence of volume overload  RENAL A:   Volume overload in context of Stage IV CKD.  Metabolic acidosis.  Hyperkalemia - has corrected with insulin and glucose. P:   Nephrology consulted for urgent IHD via fistula. Patient has sufficient BP.  Dr Moshe Cipro notified.    NEUROLOGIC A:   Comatose following arrest.  May be residual effect of intubation medications.  P:   RASS goal: N/A Initiate  TTM if no improvement in next hour. Will have to see how the patient responds over the next 24-48 hours once he is rewarmed. The atient appears to be a little more responsive tha he was yesterday. Will reassess later in the day.         CRITICAL CARE Crititicalcare 40 minutes Performed by: Lauralyn Primes pulmonary and criticalcare   T

## 2018-03-12 ENCOUNTER — Inpatient Hospital Stay (HOSPITAL_COMMUNITY): Payer: Medicare Other

## 2018-03-12 LAB — BASIC METABOLIC PANEL
Anion gap: 13 (ref 5–15)
BUN: 31 mg/dL — ABNORMAL HIGH (ref 6–20)
CO2: 25 mmol/L (ref 22–32)
Calcium: 8.6 mg/dL — ABNORMAL LOW (ref 8.9–10.3)
Chloride: 94 mmol/L — ABNORMAL LOW (ref 101–111)
Creatinine, Ser: 9.15 mg/dL — ABNORMAL HIGH (ref 0.61–1.24)
GFR calc Af Amer: 6 mL/min — ABNORMAL LOW (ref >60)
GFR calc non Af Amer: 5 mL/min — ABNORMAL LOW (ref >60)
Glucose, Bld: 95 mg/dL (ref 65–99)
POTASSIUM: 4.9 mmol/L (ref 3.5–5.1)
Sodium: 132 mmol/L — ABNORMAL LOW (ref 135–145)

## 2018-03-12 LAB — GLUCOSE, CAPILLARY
GLUCOSE-CAPILLARY: 103 mg/dL — AB (ref 65–99)
GLUCOSE-CAPILLARY: 162 mg/dL — AB (ref 65–99)
GLUCOSE-CAPILLARY: 48 mg/dL — AB (ref 65–99)
Glucose-Capillary: 104 mg/dL — ABNORMAL HIGH (ref 65–99)
Glucose-Capillary: 110 mg/dL — ABNORMAL HIGH (ref 65–99)
Glucose-Capillary: 134 mg/dL — ABNORMAL HIGH (ref 65–99)
Glucose-Capillary: 54 mg/dL — ABNORMAL LOW (ref 65–99)
Glucose-Capillary: 82 mg/dL (ref 65–99)
Glucose-Capillary: 89 mg/dL (ref 65–99)

## 2018-03-12 LAB — CBC
HEMATOCRIT: 31.2 % — AB (ref 39.0–52.0)
Hemoglobin: 10.6 g/dL — ABNORMAL LOW (ref 13.0–17.0)
MCH: 32.8 pg (ref 26.0–34.0)
MCHC: 34 g/dL (ref 30.0–36.0)
MCV: 96.6 fL (ref 78.0–100.0)
PLATELETS: 206 10*3/uL (ref 150–400)
RBC: 3.23 MIL/uL — AB (ref 4.22–5.81)
RDW: 13.9 % (ref 11.5–15.5)
WBC: 12 10*3/uL — ABNORMAL HIGH (ref 4.0–10.5)

## 2018-03-12 LAB — MAGNESIUM
Magnesium: 1.9 mg/dL (ref 1.7–2.4)
Magnesium: 1.7 mg/dL (ref 1.7–2.4)

## 2018-03-12 LAB — PHOSPHORUS
Phosphorus: 7.2 mg/dL — ABNORMAL HIGH (ref 2.5–4.6)
Phosphorus: 3.7 mg/dL (ref 2.5–4.6)

## 2018-03-12 MED ORDER — DEXTROSE 50 % IV SOLN
50.0000 mL | Freq: Once | INTRAVENOUS | Status: AC
  Administered 2018-03-12: 50 mL via INTRAVENOUS
  Filled 2018-03-12: qty 50

## 2018-03-12 MED ORDER — HEPARIN SODIUM (PORCINE) 1000 UNIT/ML DIALYSIS: 2400.0000 [IU] | Freq: Once | INTRAMUSCULAR | Status: AC

## 2018-03-12 MED ORDER — CHLORHEXIDINE GLUCONATE 0.12% ORAL RINSE (MEDLINE KIT): 15.0000 mL | Freq: Two times a day (BID) | OROMUCOSAL | Status: DC

## 2018-03-12 MED ORDER — DEXTROSE 50 % IV SOLN
50.0000 mL | Freq: Once | INTRAVENOUS | Status: AC
  Administered 2018-03-12: 50 mL via INTRAVENOUS

## 2018-03-12 MED ORDER — ORAL CARE MOUTH RINSE
15.0000 mL | Freq: Four times a day (QID) | OROMUCOSAL | Status: DC
  Administered 2018-03-12 – 2018-03-13 (×5): 15 mL via OROMUCOSAL

## 2018-03-12 MED ORDER — DEXTROSE 50 % IV SOLN
INTRAVENOUS | Status: AC
  Filled 2018-03-12: qty 50

## 2018-03-12 NOTE — Progress Notes (Signed)
Dialysis treatment completed.  3000 mL ultrafiltrated and net fluid removal 2500 mL.    Patient status unchanged. Lung sounds diminished and clear to ausculation in all fields. Generalized edema. Cardiac: NSR.  Disconnected lines and removed needles.  Pressure held for 10 minutes and band aid/gauze dressing applied.  Report given to bedside RN, Shonna Chock.

## 2018-03-12 NOTE — Progress Notes (Signed)
[facility] Progress Note  Subjective: they are weaning down the sedation now to see if pt will awaken  Vitals:   03/12/18 1245 03/12/18 1300 03/12/18 1315 03/12/18 1330  BP: (!) 111/54 (!) 117/50 116/60 (!) 112/57  Pulse: 77 87 90 85  Resp: _0 (!) 24  Temp:  98.2 F (36.8 C)    TempSrc:      SpO2: 92% 91% 95% 93%  Weight:      Height:        Inpatient medications: . chlorhexidine gluconate (MEDLINE KIT)  15 mL Mouth Rinse BID  . Chlorhexidine Gluconate Cloth  6 each Topical Daily  . dextrose      . feeding supplement (PRO-STAT SUGAR FREE 64)  60 mL Per Tube BID  . [START ON 03/13/2018] heparin  2,400 Units Dialysis Once in dialysis  . heparin  5,000 Units Subcutaneous Q8H  . mouth rinse  15 mL Mouth Rinse 10 times per day  . sodium chloride flush  10-40 mL Intracatheter Q12H   . sodium chloride Stopped (03/09/18 2142)  . sodium chloride    . sodium chloride Stopped (03/09/18 2142)  . dextrose 50 mL/hr at 03/12/18 1100  . famotidine (PEPCID) IV Stopped (03/11/18 2124)  . feeding supplement (OSMOLITE 1.5 CAL) 45 mL/hr at 03/12/18 1200  . fentaNYL infusion INTRAVENOUS 50 mcg/hr (03/12/18 1100)  . labetalol (NORMODYNE) infusion Stopped (03/12/18 0336)  . midazolam (VERSED) infusion Stopped (03/12/18 1054)  . norepinephrine (LEVOPHED) Adult infusion Stopped (03/09/18 1245)  . phenylephrine (NEO-SYNEPHRINE) Adult infusion Stopped (03/11/18 2058)   sodium chloride, Place/Maintain arterial line **AND** sodium chloride, bisacodyl, sennosides, sodium chloride flush  Exam: Gen on vent unresponsive Sclera anicteric, throat w ETT in place  No jvd or bruits Chest some coarse BS, mostly clear RRR no MRG Abd soft ntnd no mass or ascites +bs MS no joint effusions or deformity Ext no LE or UE edema Neuro is sedated on the vent LUA AVF+bruit    home meds:  norvasc 10 qd/ lisinopril 2.5 qd/ lopressor 12.5 bid hold am dose on hd days  thiamine/ mvi/ folvite/  ecasa/ prn's/ phoslo ac   Dialysis: [facility] tts  4h 4mn  84kg   2/2 bath Hep 2400  LUA AVF - venofer 50 / wk - mircera 75 ug q 2wks - calcitriol 2.75 ug tiw - sensipar 90 po tiw   Impression: 1  Acute resp failure - on the vent, severe edema on presenting CXR; still on vent, weaning sedation 2  SP PEA arrest - in ED day of admission; sp cooling protocol  3  Esrd - had full HD x 2 this week on 5/14 and 5/15, next HD today 4  HTN - home meds on hold, bp's soft 5  Vol overload - chronic issue w/ this patient, still up 6kg by wts 6  Anemia ckd - Hb 10- 11 , get records for esa/ fe 7  Mbd ckd - cont meds when off the vent 8  Hx syst CHF - last echo showed improved LVEF to 65% in Feb 2019 9  Afib / flutter - not on anticoag due to hx nonadherence; NSR now per EKG x 2   Plan - HD today and possibly again tomorrow   RKelly SplinterMD [facility] pager [phone]  03/12/2018, 1:35 PM   Recent Labs  Lab 03/10/18 0329 03/11/18 0212 03/11/18 1551 03/12/18 0328  NA 134* 133*  --  132*  K 3.5  4.9  --  4.9  CL 96* 96*  --  94*  CO2 25 27  --  25  GLUCOSE 88 148*  --  95  BUN 18 16  --  31*  CREATININE 4.73* 5.94*  --  9.15*  CALCIUM 8.2* 8.1*  --  8.6*  PHOS 3.2 5.4* 5.5* 7.2*   Recent Labs  Lab 03/09/18 1114  AST 34  ALT 21  ALKPHOS 46  BILITOT 0.5  PROT 6.7  ALBUMIN 3.0*   Recent Labs  Lab 03/09/18 0517  03/10/18 0329 03/11/18 0212 03/12/18 0328  WBC 8.5   < > 8.9 9.3 12.0*  NEUTROABS 4.4  --   --   --   --   HGB 10.9*   < > 11.6* 11.3* 10.6*  HCT 31.4*   < > 33.1* 33.6* 31.2*  MCV 96.9   < > 94.0 97.7 96.6  PLT 220   < > 269 225 206   < > = values in this interval not displayed.   Iron/TIBC/Ferritin/ %Sat    Component Value Date/Time   IRON 51 05/22/2016 0414   TIBC 262 05/22/2016 0414   FERRITIN 113 05/22/2016 0414   IRONPCTSAT 19 05/22/2016 0414

## 2018-03-12 NOTE — Progress Notes (Signed)
Paged Dr. Renaldo Reel to notify him of pt wt of 87.7 kg  after dialysis and after removing all the cooling pads   pgr #[phone].

## 2018-03-12 NOTE — Progress Notes (Signed)
UFG lowered by 500 mL d/t heart rate, increased PVC's.  Will continue to monitor.

## 2018-03-12 NOTE — Progress Notes (Signed)
PULMONARY / CRITICAL CARE MEDICINE   Name: Kenneth [name] MRN: [id number] DOB: 09-04-1956    ADMISSION DATE:  03/09/2018 CONSULTATION DATE:  03/09/2018  REFERRING MD:  Everlene Balls, [place] [facility]  CHIEF COMPLAINT:  Status post cardiac arrest.   HISTORY OF PRESENT ILLNESS:   History obtained from chart and ED staff as patient is comatose.  62 year old man with history of ESRD stage IV and CHF with frequent recent admissions for 'flash' pulmonary edema over the last 3 months.  Usually dialyzed TTS at [facility] and attended last session on 5/11.  Presented this morning with worsening dyspnea since last session on Saturday. He denies fevers, chills, or productive cough.  Initially only mild respiratory distress, but then suddenly collapsed while getting up to use commode. CPR initiated for PEA, received empiric treatment for hyperkalemia. Shock x1 for WCT, also given amiodarone. ROSC after 10 mins of ACLS.  5/15 The patie t is still being cooled at this time externally. He is not arousable presently. He is on Versed 3 mg/hr. He is hemodynamically stable off of pressors  5/16 The patient will open eyes briefly when called he raised his RUE. His sedation was just turned off. He remains on  asmall dose of neosynephrine. He got dialyzed yesterday and willresume in all likelhood Saturday.  5/17 The patient remains on the ventilaltor. He is presently sedated with 6mg /hr Versed. His tube feeds had to be turned off and he was than suctioned for about 1 liters of fluid dark broan in color. He is off pressors ( had been on low dose neosynephrine). His pulse ox on 30% is only running 88-01 % presently   PAST MEDICAL HISTORY :  He  has a past medical history of Acute CHF ([place]) (01/2018), Cardiomyopathy secondary, Chronic combined systolic and diastolic heart failure ([place]), ESRD (end stage renal disease) on dialysis [facility]), History of alcohol abuse, and Hypertension.  PAST SURGICAL HISTORY: He  has a  past surgical history that includes Cardiac catheterization (N/A, 05/13/2016) and AV fistula placement (Left, 05/14/2016).  No Known Allergies  No current facility-administered medications on file prior to encounter.    Current Outpatient Medications on File Prior to Encounter  Medication Sig  . amLODipine (NORVASC) 10 MG tablet Take 10 mg by mouth at bedtime.  Marland Kitchen aspirin EC 81 MG EC tablet Take 1 tablet (81 mg total) by mouth daily.  . calcium acetate (PHOSLO) 667 MG capsule Take 1,334-2,001 mg by mouth See admin instructions. 2,001 mg three times a day with meals and 1,334 mg two times a day with snacks  . diphenhydramine-acetaminophen (TYLENOL PM) 25-500 MG TABS tablet Take 1 tablet by mouth at bedtime as needed (for sleep).  Marland Kitchen lisinopril (PRINIVIL,ZESTRIL) 2.5 MG tablet Take 1 tablet (2.5 mg total) by mouth daily.  . metoprolol tartrate (LOPRESSOR) 25 MG tablet Take a half tablet (12.5 mg) twice a day. Do not take on mornings of dialysis (Patient taking differently: Take 12.5 mg by mouth See admin instructions. Take a half tablet (12.5 mg) twice a day. Do not take on mornings of dialysis)  . folic acid (FOLVITE) 1 MG tablet Take 1 tablet (1 mg total) by mouth daily. (Patient not taking: Reported on 02/16/2018)  . Multiple Vitamin (MULTIVITAMIN WITH MINERALS) TABS tablet Take 1 tablet by mouth daily. (Patient not taking: Reported on 02/16/2018)  . thiamine 100 MG tablet Take 1 tablet (100 mg total) by mouth daily. (Patient not taking: Reported on 02/16/2018)  FAMILY HISTORY:  His indicated that his mother is deceased. He indicated that his father is deceased.   SOCIAL HISTORY: He  reports that he has been smoking cigars.  He has quit using smokeless tobacco. He reports that he drinks about 18.0 oz of alcohol per week. He reports that he does not use drugs.  REVIEW OF SYSTEMS:   Unable to perform due tod comatose state.  SUBJECTIVE:  Patient is currently intubated.   VITAL SIGNS: BP  (!) 121/46   Pulse 78   Temp 98.6 F (37 C) (Core)   Resp 18   Ht 5\' 11"  (1.803 m)   Wt 202 lb 10 oz (91.9 kg)   SpO2 99%   BMI 28.26 kg/m   HEMODYNAMICS: CVP:  [5 mmHg-8 mmHg] 8 mmHgHypertensive on no vasoactive medications.   VENTILATOR SETTINGS: Vent Mode: PRVC FiO2 (%):  [30 %-40 %] 30 % Set Rate:  [18 bmp-26 bmp] 18 bmp Vt Set:  [600 mL] 600 mL PEEP:  [5 cmH20] 5 cmH20 Plateau Pressure:  [22 DDU20-25 cmH20] 22 cmH20  INTAKE / OUTPUT: I/O last 3 completed shifts: In: 4539.8 [I.V.:2744.8; NG/GT:1695; IV Piggyback:100] Out: 1000 [Emesis/NG output:1000]  PHYSICAL EXAMINATION: General:  Chronically ill appearing man Neuro:  Intubated. Still under NMB from RSI.  Patient diaphoretic, likely showing some awareness. Pupils 37mm. No response to pain. HEENT:  7.77mm ETT. No tongue laceration. Sclerae injected.  Cardiovascular:  3/6 SEM. No gallop. Lungs:  Clear bilaterally  Abdomen:  Soft, non tender with no masses. Musculoskeletal:  No active joints.  L forearm fistula with strong thrill. Skin:  Intact.  LABS:  BMET Recent Labs  Lab 03/10/18 0329 03/11/18 0212 03/12/18 0328  NA 134* 133* 132*  K 3.5 4.9 4.9  CL 96* 96* 94*  CO2 25 27 25   BUN 18 16 31*  CREATININE 4.73* 5.94* 9.15*  GLUCOSE 88 148* 95    Electrolytes Recent Labs  Lab 03/10/18 0329 03/11/18 0212 03/11/18 1551 03/12/18 0328  CALCIUM 8.2* 8.1*  --  8.6*  MG 1.8 1.8 1.8 1.9  PHOS 3.2 5.4* 5.5* 7.2*    CBC Recent Labs  Lab 03/10/18 0329 03/11/18 0212 03/12/18 0328  WBC 8.9 9.3 12.0*  HGB 11.6* 11.3* 10.6*  HCT 33.1* 33.6* 31.2*  PLT 269 225 206    Coag's Recent Labs  Lab 03/09/18 1412 03/09/18 2030  INR 1.06 1.15    Sepsis Markers Recent Labs  Lab 03/09/18 0946 03/09/18 1112 03/09/18 1121  LATICACIDVEN 8.26* 1.0 0.87    ABG Recent Labs  Lab 03/10/18 0340 03/11/18 0804 03/11/18 2051  PHART 7.390 7.413 7.420  PCO2ART 43.7 43.0 45.4  PO2ART 118* 76.0* 69.0*     Liver Enzymes Recent Labs  Lab 03/09/18 1114  AST 34  ALT 21  ALKPHOS 46  BILITOT 0.5  ALBUMIN 3.0*    Cardiac Enzymes Recent Labs  Lab 03/09/18 1848 03/09/18 2239 03/10/18 0329  TROPONINI 0.76* 0.71* 0.64*    Glucose Recent Labs  Lab 03/11/18 2017 03/12/18 0013 03/12/18 0059 03/12/18 0333 03/12/18 0556 03/12/18 0641  GLUCAP 92 54* 162* 104* 48* 110*    Imaging No results found.     LINES/TUBES: Right radial arterial line. Right IJ catheter inplace.  DISCUSSION: 62 year old man with ESRD and HFrEF presents with recurrent flash pulmonary edema with cardiac arrest - exact etiology of arrest unclear as is cause of recurrent flash edema.   ASSESSMENT / PLAN:  PULMONARY A: Acute respiratory failure due  to acute pulmonary edema. P:   Mechanical ventilation with lung protective strategy. Dialysis for fluid removal. Initiate weaning once awake and fluid overload corrected. 5/15 The patient is about to start dialysis 5/16 The patient is lying in semi-fowlers. He appears conmfortable. May be able to try tim on SBT once he wakes up a little more now that sedationis off. At present he is on FI02 30% and PEEP of 5. 5/17 I have ordered a new CXR The patient is too sedated presently tto do a weaning trial  CARDIOVASCULAR A:  Status post PEA arrest, now hypertensive. Likely transient worsening of cardiac function due to hypoxia/hyperkalemia, increase WOB. P:  Resume HF therapy once volume overload corrected.  Rule out ACS. 5/14 He is in a NSR. His peak troponin was 0.76. No gross ischemic changes on his ECG. Lung fields are pretty clear. There is amll to moderate right pleural effusion. 5/16 Remains in NSR No evidence of arrhtytmia No major evidence of volume overload 5/17 The patient is off Neosynphrine He appears to be hemodynamically stable. He is in  A sinus rhythm  RENAL A:   Volume overload in context of Stage IV CKD.  Metabolic acidosis.   Hyperkalemia - has corrected with insulin and glucose. P:   Nephrology consulted for urgent IHD via fistula. Patient has sufficient BP.  Dr Moshe Cipro notified. Had diallysis 5/14 and 5/15. To have dialysis today    NEUROLOGIC A:   Comatose following arrest.  May be residual effect of intubation medications.  P:   RASS goal: N/A Initiate TTM if no improvement in next hour. Will have to see how the patient responds over the next 24-48 hours once he is rewarmed. The atient appears to be a little more responsive than he was yesterday. Will reassess later in the day.  5/17 I f we are able I will turn down his Versed later today. He had been increasingly responsive yesterday  GI The patient's abdomen does not feel distended. However, there appears to be an issue with him tolerating the enteral feeds. They have been placed on hold for the time being Unsure if he may nee promotility agent or imaging.  Hypoglycemia The patient's blood sugars are decreased due to the need to stop his tube feeds. We will monitor and if need be add D 10W.          CRITICAL CARE Crititicalcare 40 minutes Performed by: Lauralyn Primes pulmonary and criticalcare   T

## 2018-03-12 NOTE — Progress Notes (Signed)
ELink called d/t pt having new brown oral secretions. Tube feeds stopped and OG tube placed to intermittent suction. In less than 5 minutes pt output was 1068ml of brown, yellow fluid.  MD made aware. No new orders received. Vitals stable. Will continue to monitor.

## 2018-03-12 NOTE — Progress Notes (Signed)
Hypoglycemic Event  CBG: 54  Treatment: D50 IV 50 mL  Symptoms: None  Follow-up CBG: Time:0100 CBG Result: 162  Possible Reasons for Event: Inadequate meal intake    Kenneth [name]

## 2018-03-12 NOTE — Progress Notes (Signed)
Aririved to patient room 2H-08 at 1140.  Reviewed treatment plan and this RN agrees.  Report received from bedside RN, Shonna Chock.  Consent verified3.  Patient Sedated, intubated and ventilated. Lung sounds diminished and clear to ausculation in all fields. BLE non pitting edema edema. Cardiac: NSR.  Prepped LUAVF with alcohol and cannulated with two 15 gauge needles.  Pulsation of blood noted.  Flushed access well with saline per protocol.  Connected and secured lines and initiated tx at 1155.  UF goal of 3500 mL and net fluid removal of 3000 mL.  Will continue to monitor.

## 2018-03-13 LAB — BASIC METABOLIC PANEL
ANION GAP: 12 (ref 5–15)
BUN: 24 mg/dL — ABNORMAL HIGH (ref 6–20)
CHLORIDE: 91 mmol/L — AB (ref 101–111)
CO2: 28 mmol/L (ref 22–32)
Calcium: 8.1 mg/dL — ABNORMAL LOW (ref 8.9–10.3)
Creatinine, Ser: 7.25 mg/dL — ABNORMAL HIGH (ref 0.61–1.24)
GFR calc Af Amer: 8 mL/min — ABNORMAL LOW (ref >60)
GFR, EST NON AFRICAN AMERICAN: 7 mL/min — AB (ref >60)
Glucose, Bld: 132 mg/dL — ABNORMAL HIGH (ref 65–99)
POTASSIUM: 4.5 mmol/L (ref 3.5–5.1)
SODIUM: 131 mmol/L — AB (ref 135–145)

## 2018-03-13 LAB — GLUCOSE, CAPILLARY
GLUCOSE-CAPILLARY: 117 mg/dL — AB (ref 65–99)
GLUCOSE-CAPILLARY: 92 mg/dL (ref 65–99)
Glucose-Capillary: 101 mg/dL — ABNORMAL HIGH (ref 65–99)
Glucose-Capillary: 118 mg/dL — ABNORMAL HIGH (ref 65–99)
Glucose-Capillary: 121 mg/dL — ABNORMAL HIGH (ref 65–99)
Glucose-Capillary: 83 mg/dL (ref 65–99)

## 2018-03-13 LAB — CBC
HCT: 27.7 % — ABNORMAL LOW (ref 39.0–52.0)
HEMOGLOBIN: 9.5 g/dL — AB (ref 13.0–17.0)
MCH: 33.1 pg (ref 26.0–34.0)
MCHC: 34.3 g/dL (ref 30.0–36.0)
MCV: 96.5 fL (ref 78.0–100.0)
PLATELETS: 199 10*3/uL (ref 150–400)
RBC: 2.87 MIL/uL — AB (ref 4.22–5.81)
RDW: 14.2 % (ref 11.5–15.5)
WBC: 11.8 10*3/uL — ABNORMAL HIGH (ref 4.0–10.5)

## 2018-03-13 LAB — PHOSPHORUS: Phosphorus: 5.4 mg/dL — ABNORMAL HIGH (ref 2.5–4.6)

## 2018-03-13 LAB — MAGNESIUM: MAGNESIUM: 1.9 mg/dL (ref 1.7–2.4)

## 2018-03-13 MED ORDER — CHLORHEXIDINE GLUCONATE 0.12% ORAL RINSE (MEDLINE KIT)
15.0000 mL | Freq: Two times a day (BID) | OROMUCOSAL | Status: DC
  Administered 2018-03-13 – 2018-03-15 (×5): 15 mL via OROMUCOSAL

## 2018-03-13 MED ORDER — HEPARIN SODIUM (PORCINE) 1000 UNIT/ML DIALYSIS
2400.0000 [IU] | Freq: Once | INTRAMUSCULAR | Status: AC
  Administered 2018-03-13: 2400 [IU] via INTRAVENOUS_CENTRAL

## 2018-03-13 MED ORDER — METOPROLOL TARTRATE 25 MG/10 ML ORAL SUSPENSION
12.5000 mg | Freq: Two times a day (BID) | ORAL | Status: DC
  Administered 2018-03-13 – 2018-03-15 (×4): 12.5 mg via ORAL
  Filled 2018-03-13 (×4): qty 5

## 2018-03-13 MED ORDER — ORAL CARE MOUTH RINSE
15.0000 mL | OROMUCOSAL | Status: DC
  Administered 2018-03-13 – 2018-03-15 (×23): 15 mL via OROMUCOSAL

## 2018-03-13 MED ORDER — FENTANYL 2500MCG IN NS 250ML (10MCG/ML) PREMIX INFUSION
0.0000 ug/h | INTRAVENOUS | Status: DC
  Administered 2018-03-13: 250 ug/h via INTRAVENOUS
  Administered 2018-03-14: 150 ug/h via INTRAVENOUS
  Administered 2018-03-15: 100 ug/h via INTRAVENOUS
  Filled 2018-03-13 (×3): qty 250

## 2018-03-13 MED ORDER — METOPROLOL TARTRATE 5 MG/5ML IV SOLN
2.5000 mg | INTRAVENOUS | Status: DC | PRN
  Administered 2018-03-13 – 2018-03-15 (×4): 5 mg via INTRAVENOUS
  Administered 2018-03-15: 2.5 mg via INTRAVENOUS
  Administered 2018-03-15 – 2018-03-16 (×4): 5 mg via INTRAVENOUS
  Filled 2018-03-13 (×9): qty 5

## 2018-03-13 NOTE — Progress Notes (Signed)
HD tx initiated via 15Gx2 w/o problem, pull/push/flush well, VSS, will cont to monitor while on HD tx 

## 2018-03-13 NOTE — Progress Notes (Signed)
PULMONARY / CRITICAL CARE MEDICINE   Name: Kenneth [name] MRN: [id number] DOB: 11-May-1956    ADMISSION DATE:  03/09/2018 CONSULTATION DATE:  03/09/2018  REFERRING MD:  Everlene Balls, [facility]  CHIEF COMPLAINT:  Status post cardiac arrest.   HISTORY OF PRESENT ILLNESS:   History obtained from chart and ED staff as patient is comatose.  62 year old man with history of ESRD stage IV and CHF with frequent recent admissions for 'flash' pulmonary edema over the last 3 months.  Usually dialyzed TTS at [facility] and attended last session on 5/11.  Presented this morning with worsening dyspnea since last session on Saturday. He denies fevers, chills, or productive cough.  Initially only mild respiratory distress, but then suddenly collapsed while getting up to use commode. CPR initiated for PEA, received empiric treatment for hyperkalemia. Shock x1 for WCT, also given amiodarone. ROSC after 10 mins of ACLS.  5/15 The patie t is still being cooled at this time externally. He is not arousable presently. He is on Versed 3 mg/hr. He is hemodynamically stable off of pressors  5/16 The patient will open eyes briefly when called he raised his RUE. His sedation was just turned off. He remains on  asmall dose of neosynephrine. He got dialyzed yesterday and willresume in all likelhood Saturday.  5/17 The patient remains on the ventilaltor. He is presently sedated with 6mg /hr Versed. His tube feeds had to be turned off and he was than suctioned for about 1 liters of fluid dark broan in color. He is off pressors (had been on low dose neosynephrine). His pulse ox on 30% is only running 88-91 % presently.  5/18 The patient is doing better on multiple fronts today. He is tolerating his tube feeds ok presently. His abdominal film was unremarkable. He is net negative fluidwise over 5 liters on the past few days.His CXR has cleared at that right base His 02 sats are much better. The patient is on 30% FI02  and 02 sat in the 90s. I am hoping to get the patient off the vent once he is more alert. I believe another round of dialysis is planned for today.    PAST MEDICAL HISTORY :  He  has a past medical history of Acute CHF ([place]) (01/2018), Cardiomyopathy secondary, Chronic combined systolic and diastolic heart failure ([place]), ESRD (end stage renal disease) on dialysis [facility]), History of alcohol abuse, and Hypertension.  PAST SURGICAL HISTORY: He  has a past surgical history that includes Cardiac catheterization (N/A, 05/13/2016) and AV fistula placement (Left, 05/14/2016).  No Known Allergies  No current facility-administered medications on file prior to encounter.    Current Outpatient Medications on File Prior to Encounter  Medication Sig  . amLODipine (NORVASC) 10 MG tablet Take 10 mg by mouth at bedtime.  Marland Kitchen aspirin EC 81 MG EC tablet Take 1 tablet (81 mg total) by mouth daily.  . calcium acetate (PHOSLO) 667 MG capsule Take 1,334-2,001 mg by mouth See admin instructions. 2,001 mg three times a day with meals and 1,334 mg two times a day with snacks  . diphenhydramine-acetaminophen (TYLENOL PM) 25-500 MG TABS tablet Take 1 tablet by mouth at bedtime as needed (for sleep).  Marland Kitchen lisinopril (PRINIVIL,ZESTRIL) 2.5 MG tablet Take 1 tablet (2.5 mg total) by mouth daily.  . metoprolol tartrate (LOPRESSOR) 25 MG tablet Take a half tablet (12.5 mg) twice a day. Do not take on mornings of dialysis (Patient taking differently: Take 12.5 mg by mouth  See admin instructions. Take a half tablet (12.5 mg) twice a day. Do not take on mornings of dialysis)  . folic acid (FOLVITE) 1 MG tablet Take 1 tablet (1 mg total) by mouth daily. (Patient not taking: Reported on 02/16/2018)  . Multiple Vitamin (MULTIVITAMIN WITH MINERALS) TABS tablet Take 1 tablet by mouth daily. (Patient not taking: Reported on 02/16/2018)  . thiamine 100 MG tablet Take 1 tablet (100 mg total) by mouth daily. (Patient not taking: Reported on  02/16/2018)    FAMILY HISTORY:  His indicated that his mother is deceased. He indicated that his father is deceased.   SOCIAL HISTORY: He  reports that he has been smoking cigars.  He has quit using smokeless tobacco. He reports that he drinks about 18.0 oz of alcohol per week. He reports that he does not use drugs.  REVIEW OF SYSTEMS:   Unable to perform due tod comatose state.  SUBJECTIVE:  Patient is currently intubated.   VITAL SIGNS: BP (!) 143/54   Pulse 89   Temp (!) 97.2 F (36.2 C) (Esophageal)   Resp 20   Ht 5\' 11"  (1.803 m)   Wt 195 lb 12.3 oz (88.8 kg)   SpO2 100%   BMI 27.30 kg/m   HEMODYNAMICS: CVP:  [0 mmHg-12 mmHg] 1 mmHgHypertensive on no vasoactive medications.   VENTILATOR SETTINGS: Vent Mode: PRVC FiO2 (%):  [30 %] 30 % Set Rate:  [18 bmp] 18 bmp Vt Set:  [600 mL] 600 mL PEEP:  [5 cmH20] 5 cmH20 Plateau Pressure:  [13 cmH20-22 cmH20] 13 cmH20  INTAKE / OUTPUT: I/O last 3 completed shifts: In: 4011.5 [I.V.:2456.5; NG/GT:1455; IV Piggyback:100] Out: 3500 [Emesis/NG output:1000; Other:2500]  PHYSICAL EXAMINATION: General:  Chronically ill appearing man Neuro:  Intubated. Still under NMB from RSI.  Patient diaphoretic, likely showing some awareness. Pupils 45mm. No response to pain. HEENT:  7.67mm ETT. No tongue laceration. Sclerae injected.  Cardiovascular:  3/6 SEM. No gallop. Lungs:  Clear bilaterally  Abdomen:  Soft, non tender with no masses. Musculoskeletal:  No active joints.  L forearm fistula with strong thrill. Skin:  Intact.  LABS:  BMET Recent Labs  Lab 03/11/18 0212 03/12/18 0328 03/13/18 0518  NA 133* 132* 131*  K 4.9 4.9 4.5  CL 96* 94* 91*  CO2 27 25 28   BUN 16 31* 24*  CREATININE 5.94* 9.15* 7.25*  GLUCOSE 148* 95 132*    Electrolytes Recent Labs  Lab 03/11/18 0212  03/12/18 0328 03/12/18 1719 03/13/18 0518  CALCIUM 8.1*  --  8.6*  --  8.1*  MG 1.8   < > 1.9 1.7 1.9  PHOS 5.4*   < > 7.2* 3.7 5.4*   < > =  values in this interval not displayed.    CBC Recent Labs  Lab 03/11/18 0212 03/12/18 0328 03/13/18 0518  WBC 9.3 12.0* 11.8*  HGB 11.3* 10.6* 9.5*  HCT 33.6* 31.2* 27.7*  PLT 225 206 199    Coag's Recent Labs  Lab 03/09/18 1412 03/09/18 2030  INR 1.06 1.15    Sepsis Markers Recent Labs  Lab 03/09/18 0946 03/09/18 1112 03/09/18 1121  LATICACIDVEN 8.26* 1.0 0.87    ABG Recent Labs  Lab 03/10/18 0340 03/11/18 0804 03/11/18 2051  PHART 7.390 7.413 7.420  PCO2ART 43.7 43.0 45.4  PO2ART 118* 76.0* 69.0*    Liver Enzymes Recent Labs  Lab 03/09/18 1114  AST 34  ALT 21  ALKPHOS 46  BILITOT 0.5  ALBUMIN 3.0*  Cardiac Enzymes Recent Labs  Lab 03/09/18 1848 03/09/18 2239 03/10/18 0329  TROPONINI 0.76* 0.71* 0.64*    Glucose Recent Labs  Lab 03/12/18 0641 03/12/18 0917 03/12/18 1155 03/12/18 1631 03/13/18 0352 03/13/18 0838  GLUCAP 110* 82 89 103* 117* 118*    Imaging Dg Chest Port 1 View  Result Date: 03/12/2018 CLINICAL DATA:  Intubation.  Ileus. EXAM: PORTABLE CHEST 1 VIEW COMPARISON:  03/10/2018. FINDINGS: Endotracheal tube and NG tube in stable position. Right IJ line stable position. Stable cardiomegaly. Interim improvement of right base infiltrate. Mild bibasilar atelectasis. IMPRESSION: 1.  Lines and tubes in stable position. 2. Interim partial clearing of right base infiltrate. Bibasilar atelectasis. Stable cardiomegaly. Electronically Signed   By: Marcello Moores  Register   On: 03/12/2018 10:23   Dg Abd Portable 1v  Result Date: 03/12/2018 CLINICAL DATA:  Intubation.  Ileus. EXAM: PORTABLE ABDOMEN - 1 VIEW COMPARISON:  Chest x-ray 03/12/2018. FINDINGS: NG tube noted coiled stomach. No bowel distention or free air. Aortoiliac atherosclerotic vascular calcification. No acute bony abnormality. IMPRESSION: 1.  NG tube noted coiled stomach.  No bowel distention. 2.  Aortoiliac atherosclerotic vascular disease. Electronically Signed   By: Marcello Moores   Register   On: 03/12/2018 10:22       LINES/TUBES: Right radial arterial line. Right IJ catheter inplace.  DISCUSSION: 62 year old man with ESRD and HFrEF presents with recurrent flash pulmonary edema with cardiac arrest - exact etiology of arrest unclear as is cause of recurrent flash edema.   ASSESSMENT / PLAN:  PULMONARY A: Acute respiratory failure due to acute pulmonary edema. P:   Mechanical ventilation with lung protective strategy. Dialysis for fluid removal. Initiate weaning once awake and fluid overload corrected. 5/15 The patient is about to start dialysis 5/16 The patient is lying in semi-fowlers. He appears conmfortable. May be able to try tim on SBT once he wakes up a little more now that sedationis off. At present he is on FI02 30% and PEEP of 5. 5/17 I have ordered a new CXR The patient is too sedated presently tto do a weaning trial 5/18 CXR is fairly clear. The opatient is on FI02 of 30%.  CARDIOVASCULAR A:  Status post PEA arrest, now hypertensive. Likely transient worsening of cardiac function due to hypoxia/hyperkalemia, increase WOB. P:  Resume HF therapy once volume overload corrected.  Rule out ACS. 5/14 He is in a NSR. His peak troponin was 0.76. No gross ischemic changes on his ECG. Lung fields are pretty clear. There is amll to moderate right pleural effusion. 5/16 Remains in NSR No evidence of arrhtytmia No major evidence of volume overload 5/17 The patient is off Neosynphrine He appears to be hemodynamically stable. He is in  A sinus rhythm 5/18  Off pressors Stable BP and heart rate  RENAL A:   Volume overload in context of Stage IV CKD.  Metabolic acidosis.  Hyperkalemia - has corrected with insulin and glucose. P:   Nephrology consulted for urgent IHD via fistula. Patient has sufficient BP.  Dr Moshe Cipro notified. Had diallysis 5/14 and 5/15. To have dialysis today    NEUROLOGIC A:   Comatose following arrest.  May be  residual effect of intubation medications.  P:   RASS goal: N/A Initiate TTM if no improvement in next hour. Will have to see how the patient responds over the next 24-48 hours once he is rewarmed. The atient appears to be a little more responsive than he was yesterday. Will reassess later in the  day.  5/17  I f we are able I will turn down his Versed later today. He had been increasingly responsive yesterday 5/18 Will try to get patient off versed and fentanyl with the hope he will arouse and we can extubate him.  GI The patient's abdomen does not feel distended. However, there appears to be an issue with him tolerating the enteral feeds. They have been placed on hold for the time being Unsure if he may nee promotility agent or imaging. 5/18 Abdomen soft X -ray unremarkable . He is tolerating feeds at this time. Unclear what transpired yesterday.          CRITICAL CARE Crititicalcare 40 minutes Performed by: Lauralyn Primes pulmonary and criticalcare   T

## 2018-03-13 NOTE — Progress Notes (Signed)
[facility] Progress Note  Subjective: 2.5L net off yest on hd  Vitals:   03/13/18 1000 03/13/18 1100 03/13/18 1115 03/13/18 1200  BP: 119/75 (!) 152/74  138/73  Pulse: 86 (!) 102 99 87  Resp: _0 Temp: (!) 97 F (36.1 C) (!) 97.5 F (36.4 C)  (!) 96.8 F (36 C)  TempSrc:      SpO2: 98% 97%  92%  Weight:      Height:        Inpatient medications: . chlorhexidine gluconate (MEDLINE KIT)  15 mL Mouth Rinse BID  . Chlorhexidine Gluconate Cloth  6 each Topical Daily  . feeding supplement (PRO-STAT SUGAR FREE 64)  60 mL Per Tube BID  . heparin  2,400 Units Dialysis Once in dialysis  . heparin  5,000 Units Subcutaneous Q8H  . mouth rinse  15 mL Mouth Rinse 10 times per day  . sodium chloride flush  10-40 mL Intracatheter Q12H   . sodium chloride Stopped (03/09/18 2142)  . sodium chloride    . sodium chloride Stopped (03/09/18 2142)  . dextrose 50 mL/hr at 03/13/18 1200  . famotidine (PEPCID) IV Stopped (03/12/18 2335)  . feeding supplement (OSMOLITE 1.5 CAL) 45 mL/hr at 03/13/18 1200  . labetalol (NORMODYNE) infusion Stopped (03/12/18 0336)  . norepinephrine (LEVOPHED) Adult infusion Stopped (03/09/18 1245)   sodium chloride, Place/Maintain arterial line **AND** sodium chloride, bisacodyl, sennosides, sodium chloride flush  Exam: Gen on vent unresponsive Sclera anicteric, throat w ETT in place  No jvd or bruits Chest some coarse BS, mostly clear RRR no MRG Abd soft ntnd no mass or ascites +bs MS no joint effusions or deformity Ext no LE or UE edema Neuro is sedated on the vent LUA AVF+bruit    home meds:  norvasc 10 qd/ lisinopril 2.5 qd/ lopressor 12.5 bid hold am dose on hd days  thiamine/ mvi/ folvite/ ecasa/ prn's/ phoslo ac   Dialysis: [facility] tts  4h 44mn  84kg   2/2 bath Hep 2400  LUA AVF - venofer 50 / wk - mircera 75 ug q 2wks - calcitriol 2.75 ug tiw - sensipar 90 po tiw   Impression: 1  Acute resp failure/ pulm edema -  improved 2  SP PEA arrest - in ED day of admission; sp cooling protocol  3  Esrd - sp HD x 3 this week , plan short HD today or tomorrow 4  HTN - home meds on hold, bp's soft 5  Vol overload - recurrent problem improved in hospital due to captive audience 6  Anemia ckd - Hb 10- 11 , get records for esa/ fe 7  Mbd ckd - cont meds when off the vent 8  Hx syst CHF - last echo showed improved LVEF to 65% in Feb 2019 9  Afib / flutter - not on anticoag due to hx nonadherence; NSR now per EKG x 2   Plan - as above   RKelly SplinterMD [facility] pager [phone]  03/13/2018, 12:23 PM   Recent Labs  Lab 03/11/18 0212  03/12/18 0328 03/12/18 1719 03/13/18 0518  NA 133*  --  132*  --  131*  K 4.9  --  4.9  --  4.5  CL 96*  --  94*  --  91*  CO2 27  --  25  --  28  GLUCOSE 148*  --  95  --  132*  BUN 16  --  31*  --  24*  CREATININE 5.94*  --  9.15*  --  7.25*  CALCIUM 8.1*  --  8.6*  --  8.1*  PHOS 5.4*   < > 7.2* 3.7 5.4*   < > = values in this interval not displayed.   Recent Labs  Lab 03/09/18 1114  AST 34  ALT 21  ALKPHOS 46  BILITOT 0.5  PROT 6.7  ALBUMIN 3.0*   Recent Labs  Lab 03/09/18 0517  03/11/18 0212 03/12/18 0328 03/13/18 0518  WBC 8.5   < > 9.3 12.0* 11.8*  NEUTROABS 4.4  --   --   --   --   HGB 10.9*   < > 11.3* 10.6* 9.5*  HCT 31.4*   < > 33.6* 31.2* 27.7*  MCV 96.9   < > 97.7 96.6 96.5  PLT 220   < > 225 206 199   < > = values in this interval not displayed.   Iron/TIBC/Ferritin/ %Sat    Component Value Date/Time   IRON 51 05/22/2016 0414   TIBC 262 05/22/2016 0414   FERRITIN 113 05/22/2016 0414   IRONPCTSAT 19 05/22/2016 0414

## 2018-03-13 NOTE — Progress Notes (Signed)
HD tx ended 45 min early d/t pt in afib RVR/ and continued runs of Vtach not r/t HD tx but prim RN and CN asked if I could please stop the tx. Dr. Jonnie Finner was made aware of this. UF goal not met, blood rinsed back, HR and rhythm still irregular and increased. Report given to primary nruse

## 2018-03-14 ENCOUNTER — Inpatient Hospital Stay (HOSPITAL_COMMUNITY): Payer: Medicare Other

## 2018-03-14 LAB — GLUCOSE, CAPILLARY
GLUCOSE-CAPILLARY: 115 mg/dL — AB (ref 65–99)
Glucose-Capillary: 126 mg/dL — ABNORMAL HIGH (ref 65–99)
Glucose-Capillary: 142 mg/dL — ABNORMAL HIGH (ref 65–99)
Glucose-Capillary: 124 mg/dL — ABNORMAL HIGH (ref 65–99)
Glucose-Capillary: 126 mg/dL — ABNORMAL HIGH (ref 65–99)

## 2018-03-14 LAB — BASIC METABOLIC PANEL
Anion gap: 11 (ref 5–15)
BUN: 40 mg/dL — AB (ref 6–20)
CO2: 28 mmol/L (ref 22–32)
Calcium: 8.7 mg/dL — ABNORMAL LOW (ref 8.9–10.3)
Chloride: 93 mmol/L — ABNORMAL LOW (ref 101–111)
Creatinine, Ser: 8.65 mg/dL — ABNORMAL HIGH (ref 0.61–1.24)
GFR calc Af Amer: 7 mL/min — ABNORMAL LOW (ref >60)
GFR calc non Af Amer: 6 mL/min — ABNORMAL LOW (ref >60)
GLUCOSE: 137 mg/dL — AB (ref 65–99)
POTASSIUM: 4.9 mmol/L (ref 3.5–5.1)
SODIUM: 132 mmol/L — AB (ref 135–145)

## 2018-03-14 LAB — HEPATITIS B SURFACE ANTIGEN: HEP B S AG: NEGATIVE

## 2018-03-14 LAB — MAGNESIUM: Magnesium: 2.3 mg/dL (ref 1.7–2.4)

## 2018-03-14 MED ORDER — ALBUTEROL SULFATE (2.5 MG/3ML) 0.083% IN NEBU
2.5000 mg | INHALATION_SOLUTION | Freq: Four times a day (QID) | RESPIRATORY_TRACT | Status: DC
  Administered 2018-03-14 – 2018-03-16 (×8): 2.5 mg via RESPIRATORY_TRACT
  Filled 2018-03-14 (×10): qty 3

## 2018-03-14 MED ORDER — PROMETHAZINE-CODEINE 6.25-10 MG/5ML PO SYRP
5.0000 mL | ORAL_SOLUTION | Freq: Three times a day (TID) | ORAL | Status: DC
  Administered 2018-03-14 (×2): 5 mL via ORAL
  Filled 2018-03-14 (×2): qty 5

## 2018-03-14 MED ORDER — ALBUTEROL SULFATE (5 MG/ML) 0.5% IN NEBU: 2.5000 mg | INHALATION_SOLUTION | Freq: Four times a day (QID) | RESPIRATORY_TRACT | Status: DC

## 2018-03-14 MED ORDER — SCOPOLAMINE 1 MG/3DAYS TD PT72
1.0000 | MEDICATED_PATCH | TRANSDERMAL | Status: DC
  Administered 2018-03-14: 1.5 mg via TRANSDERMAL
  Filled 2018-03-14 (×2): qty 1

## 2018-03-14 NOTE — Progress Notes (Signed)
[facility] Progress Note  Subjective: 1.5 L off yest session cut short due to wct / afib  Vitals:   03/14/18 1120 03/14/18 1157 03/14/18 1200 03/14/18 1300  BP: 99/66  (!) 90/40 110/65  Pulse: 98  82 83  Resp:   16 18  Temp:  99.2 F (37.3 C)    TempSrc:  Axillary    SpO2:   100% 100%  Weight:      Height:        Inpatient medications: . albuterol  2.5 mg Nebulization QID  . chlorhexidine gluconate (MEDLINE KIT)  15 mL Mouth Rinse BID  . Chlorhexidine Gluconate Cloth  6 each Topical Daily  . feeding supplement (PRO-STAT SUGAR FREE 64)  60 mL Per Tube BID  . heparin  5,000 Units Subcutaneous Q8H  . mouth rinse  15 mL Mouth Rinse 10 times per day  . metoprolol tartrate  12.5 mg Oral BID  . promethazine-codeine  5 mL Oral TID  . scopolamine  1 patch Transdermal Q72H  . sodium chloride flush  10-40 mL Intracatheter Q12H   . sodium chloride Stopped (03/09/18 2142)  . sodium chloride    . sodium chloride Stopped (03/09/18 2142)  . dextrose 50 mL/hr at 03/14/18 1300  . famotidine (PEPCID) IV Stopped (03/13/18 2236)  . feeding supplement (OSMOLITE 1.5 CAL) 45 mL/hr at 03/14/18 1300  . fentaNYL infusion INTRAVENOUS 150 mcg/hr (03/14/18 1300)  . labetalol (NORMODYNE) infusion Stopped (03/12/18 0336)  . norepinephrine (LEVOPHED) Adult infusion Stopped (03/09/18 1245)   sodium chloride, Place/Maintain arterial line **AND** sodium chloride, bisacodyl, metoprolol tartrate, sennosides, sodium chloride flush  Exam: Gen on vent unresponsive Sclera anicteric, throat w ETT in place  No jvd or bruits Chest some coarse BS, mostly clear RRR no MRG Abd soft ntnd no mass or ascites +bs MS no joint effusions or deformity Ext no LE or UE edema Neuro is sedated on the vent LUA AVF+bruit    home meds:  norvasc 10 qd/ lisinopril 2.5 qd/ lopressor 12.5 bid hold am dose on hd days  thiamine/ mvi/ folvite/ ecasa/ prn's/ phoslo ac   Dialysis: [facility] tts  4h 52mn  84kg   2/2  bath Hep 2400  LUA AVF - venofer 50 / wk - mircera 75 ug q 2wks - calcitriol 2.75 ug tiw - sensipar 90 po tiw   Impression: 1  Acute resp failure/ pulm edema - improved 2  SP PEA arrest - in ED day of admission; sp cooling protocol  3  Esrd - usual hd tts cont this schedule  4  HTN - home meds on hold, bp's soft 5  Vol overload - recurrent problem, still up by weights but not sure accurate cxr clear and no sig vol on exam 6  Vent dependent resp failure - too sedated to extubate per ccm 6  Anemia ckd - Hb 10- 11 , no need esa now 7  Mbd ckd - cont meds when off the vent 8  Hx syst CHF - last echo showed improved LVEF to 65% in Feb 2019 9  Afib / flutter - not on anticoag due to hx nonadherence; NSR now per EKG x 2   Plan - as above   RKelly SplinterMD [facility] pager [phone]  03/14/2018, 1:41 PM   Recent Labs  Lab 03/12/18 0328 03/12/18 1719 03/13/18 0518 03/14/18 1216  NA 132*  --  131* 132*  K 4.9  --  4.5 4.9  CL  94*  --  91* 93*  CO2 25  --  28 28  GLUCOSE 95  --  132* 137*  BUN 31*  --  24* 40*  CREATININE 9.15*  --  7.25* 8.65*  CALCIUM 8.6*  --  8.1* 8.7*  PHOS 7.2* 3.7 5.4*  --    Recent Labs  Lab 03/09/18 1114  AST 34  ALT 21  ALKPHOS 46  BILITOT 0.5  PROT 6.7  ALBUMIN 3.0*   Recent Labs  Lab 03/09/18 0517  03/11/18 0212 03/12/18 0328 03/13/18 0518  WBC 8.5   < > 9.3 12.0* 11.8*  NEUTROABS 4.4  --   --   --   --   HGB 10.9*   < > 11.3* 10.6* 9.5*  HCT 31.4*   < > 33.6* 31.2* 27.7*  MCV 96.9   < > 97.7 96.6 96.5  PLT 220   < > 225 206 199   < > = values in this interval not displayed.   Iron/TIBC/Ferritin/ %Sat    Component Value Date/Time   IRON 51 05/22/2016 0414   TIBC 262 05/22/2016 0414   FERRITIN 113 05/22/2016 0414   IRONPCTSAT 19 05/22/2016 0414

## 2018-03-14 NOTE — Progress Notes (Signed)
[facility] Progress Note Patient Name: Kenneth [name] DOB: Sep 20, 1956 MRN: [id number]   Date of Service  03/14/2018  HPI/Events of Note  Vomiting bilious material - Currently on enteral nutrition.   eICU Interventions  Will order: 1. Hold enteral nutrition.  2. NGT to LIS.  3. Portable abdominal film now.     Intervention Category Major Interventions: Other:  Lysle Dingwall 03/14/2018, 10:38 PM

## 2018-03-14 NOTE — Progress Notes (Signed)
CCM called again to make aware that PRN lopressor was given will little to no result of slowing Afib or decreasing the amount of ectopy. HD treatment is still currently running. Order given for scheduled 12.5mg  lopressor. Will carry out orders and monitor patient.

## 2018-03-14 NOTE — Progress Notes (Signed)
CCM MD called to notify pt in afib with RVR and 4-5 beat runs of VT. Pt is currently having HD treatment.  Order given to given PRN lopressor. Will cont to monitor and assess.

## 2018-03-14 NOTE — Progress Notes (Signed)
PULMONARY / CRITICAL CARE MEDICINE   Name: Kenneth [name] MRN: [id number] DOB: [birth year]    ADMISSION DATE:  03/09/2018 CONSULTATION DATE:  03/09/2018  REFERRING MD:  Everlene Balls, [place] [facility]  CHIEF COMPLAINT:  Status post cardiac arrest.   HISTORY OF PRESENT ILLNESS:   History obtained from chart and ED staff as patient is comatose.  62 year old man with history of ESRD stage IV and CHF with frequent recent admissions for 'flash' pulmonary edema over the last 3 months.  Usually dialyzed TTS at [facility] and attended last session on 5/11.  Presented this morning with worsening dyspnea since last session on Saturday. He denies fevers, chills, or productive cough.  Initially only mild respiratory distress, but then suddenly collapsed while getting up to use commode. CPR initiated for PEA, received empiric treatment for hyperkalemia. Shock x1 for WCT, also given amiodarone. ROSC after 10 mins of ACLS.  5/15 The patie t is still being cooled at this time externally. He is not arousable presently. He is on Versed 3 mg/hr. He is hemodynamically stable off of pressors  5/16 The patient will open eyes briefly when called he raised his RUE. His sedation was just turned off. He remains on  asmall dose of neosynephrine. He got dialyzed yesterday and willresume in all likelhood Saturday.  5/17 The patient remains on the ventilaltor. He is presently sedated with 6mg /hr Versed. His tube feeds had to be turned off and he was than suctioned for about 1 liters of fluid dark broan in color. He is off pressors (had been on low dose neosynephrine). His pulse ox on 30% is only running 88-91 % presently.  5/18 The patient is doing better on multiple fronts today. He is tolerating his tube feeds ok presently. His abdominal film was unremarkable. He is net negative fluidwise over 5 liters on the past few days.His CXR has cleared at that right base His 02 sats are much better. The patient is on 30% FI02  and 02 sat in the 90s. I am hoping to get the patient off the vent once he is more alert. I believe another round of dialysis is planned for today.  5/19 The patient had short runs of NSVT overnight but none thgis AM. He has a lot of oral secreations. Hen has episodedes of intractable coughing as well. I believe if we can  Control the cough we can wean him.    PAST MEDICAL HISTORY :  He  has a past medical history of Acute CHF ([place]) (01/2018), Cardiomyopathy secondary, Chronic combined systolic and diastolic heart failure ([place]), ESRD (end stage renal disease) on dialysis [facility]), History of alcohol abuse, and Hypertension.  PAST SURGICAL HISTORY: He  has a past surgical history that includes Cardiac catheterization (N/A, 05/13/2016) and AV fistula placement (Left, 05/14/2016).  No Known Allergies  No current facility-administered medications on file prior to encounter.    Current Outpatient Medications on File Prior to Encounter  Medication Sig  . amLODipine (NORVASC) 10 MG tablet Take 10 mg by mouth at bedtime.  Marland Kitchen aspirin EC 81 MG EC tablet Take 1 tablet (81 mg total) by mouth daily.  . calcium acetate (PHOSLO) 667 MG capsule Take 1,334-2,001 mg by mouth See admin instructions. 2,001 mg three times a day with meals and 1,334 mg two times a day with snacks  . diphenhydramine-acetaminophen (TYLENOL PM) 25-500 MG TABS tablet Take 1 tablet by mouth at bedtime as needed (for sleep).  Marland Kitchen lisinopril (PRINIVIL,ZESTRIL) 2.5 MG  tablet Take 1 tablet (2.5 mg total) by mouth daily.  . metoprolol tartrate (LOPRESSOR) 25 MG tablet Take a half tablet (12.5 mg) twice a day. Do not take on mornings of dialysis (Patient taking differently: Take 12.5 mg by mouth See admin instructions. Take a half tablet (12.5 mg) twice a day. Do not take on mornings of dialysis)  . folic acid (FOLVITE) 1 MG tablet Take 1 tablet (1 mg total) by mouth daily. (Patient not taking: Reported on 02/16/2018)  . Multiple Vitamin  (MULTIVITAMIN WITH MINERALS) TABS tablet Take 1 tablet by mouth daily. (Patient not taking: Reported on 02/16/2018)  . thiamine 100 MG tablet Take 1 tablet (100 mg total) by mouth daily. (Patient not taking: Reported on 02/16/2018)    FAMILY HISTORY:  His indicated that his mother is deceased. He indicated that his father is deceased.   SOCIAL HISTORY: He  reports that he has been smoking cigars.  He has quit using smokeless tobacco. He reports that he drinks about 18.0 oz of alcohol per week. He reports that he does not use drugs.  REVIEW OF SYSTEMS:   Unable to perform due tod comatose state.  SUBJECTIVE:  Patient is currently intubated.   VITAL SIGNS: BP 91/60   Pulse 86   Temp 99.5 F (37.5 C) (Axillary)   Resp 18   Ht 5\' 10"  (1.778 m)   Wt 197 lb 8.5 oz (89.6 kg)   SpO2 100%   BMI 28.34 kg/m   HEMODYNAMICS: CVP:  [1 mmHg-17 mmHg] 3 mmHgHypertensive on no vasoactive medications.   VENTILATOR SETTINGS: Vent Mode: PRVC FiO2 (%):  [30 %-50 %] 50 % Set Rate:  [18 bmp] 18 bmp Vt Set:  [600 mL] 600 mL PEEP:  [5 cmH20] 5 cmH20 Pressure Support:  [10 cmH20] 10 cmH20 Plateau Pressure:  [11 cmH20-17 cmH20] 11 cmH20  INTAKE / OUTPUT: I/O last 3 completed shifts: In: 4393.8 [I.V.:2433.8; NG/GT:1860; IV Piggyback:100] Out: 9030 [Other:1474]  PHYSICAL EXAMINATION: General:  Chronically ill appearing man Neuro:  Intubated. Still under NMB from RSI.  Patient diaphoretic, likely showing some awareness. Pupils 55mm. No response to pain. HEENT:  7.45mm ETT. No tongue laceration. Sclerae injected.  Cardiovascular:  3/6 SEM. No gallop. Lungs:  Clear bilaterally  Abdomen:  Soft, non tender with no masses. Musculoskeletal:  No active joints.  L forearm fistula with strong thrill. Skin:  Intact.  LABS:  BMET Recent Labs  Lab 03/11/18 0212 03/12/18 0328 03/13/18 0518  NA 133* 132* 131*  K 4.9 4.9 4.5  CL 96* 94* 91*  CO2 27 25 28   BUN 16 31* 24*  CREATININE 5.94* 9.15*  7.25*  GLUCOSE 148* 95 132*    Electrolytes Recent Labs  Lab 03/11/18 0212  03/12/18 0328 03/12/18 1719 03/13/18 0518  CALCIUM 8.1*  --  8.6*  --  8.1*  MG 1.8   < > 1.9 1.7 1.9  PHOS 5.4*   < > 7.2* 3.7 5.4*   < > = values in this interval not displayed.    CBC Recent Labs  Lab 03/11/18 0212 03/12/18 0328 03/13/18 0518  WBC 9.3 12.0* 11.8*  HGB 11.3* 10.6* 9.5*  HCT 33.6* 31.2* 27.7*  PLT 225 206 199    Coag's Recent Labs  Lab 03/09/18 1412 03/09/18 2030  INR 1.06 1.15    Sepsis Markers Recent Labs  Lab 03/09/18 0946 03/09/18 1112 03/09/18 1121  LATICACIDVEN 8.26* 1.0 0.87    ABG Recent Labs  Lab 03/10/18 0340 03/11/18  0804 03/11/18 2051  PHART 7.390 7.413 7.420  PCO2ART 43.7 43.0 45.4  PO2ART 118* 76.0* 69.0*    Liver Enzymes Recent Labs  Lab 03/09/18 1114  AST 34  ALT 21  ALKPHOS 46  BILITOT 0.5  ALBUMIN 3.0*    Cardiac Enzymes Recent Labs  Lab 03/09/18 1848 03/09/18 2239 03/10/18 0329  TROPONINI 0.76* 0.71* 0.64*    Glucose Recent Labs  Lab 03/13/18 1233 03/13/18 1633 03/13/18 2002 03/13/18 2344 03/14/18 0453 03/14/18 0833  GLUCAP 121* 92 101* 83 126* 142*    Imaging No results found.     LINES/TUBES: Right radial arterial line. Right IJ catheter inplace.  DISCUSSION: 62 year old man with ESRD and HFrEF presents with recurrent flash pulmonary edema with cardiac arrest - exact etiology of arrest unclear as is cause of recurrent flash edema.   ASSESSMENT / PLAN:  PULMONARY A: Acute respiratory failure due to acute pulmonary edema. P:   Mechanical ventilation with lung protective strategy. Dialysis for fluid removal. Initiate weaning once awake and fluid overload corrected. 5/15 The patient is about to start dialysis 5/16 The patient is lying in semi-fowlers. He appears conmfortable. May be able to try tim on SBT once he wakes up a little more now that sedationis off. At present he is on FI02 30% and  PEEP of 5. 5/17 I have ordered a new CXR The patient is too sedated presently tto do a weaning trial 5/18 CXR is fairly clear. The opatient is on FI02 of 30%. 5/19 No dialysis planned for today  CARDIOVASCULAR A:  Status post PEA arrest, now hypertensive. Likely transient worsening of cardiac function due to hypoxia/hyperkalemia, increase WOB. P:  Resume HF therapy once volume overload corrected.  Rule out ACS. 5/14 He is in a NSR. His peak troponin was 0.76. No gross ischemic changes on his ECG. Lung fields are pretty clear. There is amll to moderate right pleural effusion. 5/16 Remains in NSR No evidence of arrhtytmia No major evidence of volume overload 5/17 The patient is off Neosynphrine He appears to be hemodynamically stable. He is in  A sinus rhythm 5/18  Off pressors Stable BP and heart rate 5/19 Patient appears to be in a A fib wiuth a heart rate about 100. No major ectopy  RENAL A:   Volume overload in context of Stage IV CKD.  Metabolic acidosis.  Hyperkalemia - has corrected with insulin and glucose. P:   Nephrology consulted for urgent IHD via fistula. Patient has sufficient BP.  Dr Moshe Cipro notified. Had diallysis 5/14 and 5/15. To have dialysis today    NEUROLOGIC A:   Comatose following arrest.  May be residual effect of intubation medications.  P:   RASS goal: N/A Initiate TTM if no improvement in next hour. Will have to see how the patient responds over the next 24-48 hours once he is rewarmed. The atient appears to be a little more responsive than he was yesterday. Will reassess later in the day.  5/17  I f we are able I will turn down his Versed later today. He had been increasingly responsive yesterday 5/18 Will try to get patient off versed and fentanyl with the hope he will arouse and we can extubate him. 5/19 Will wean down fentanyl in an effort to wean him today   I have added transcop pathc for his co[pious opral sectretions. I have  added albuterol treatment sand phenergan and codeine to help with the cough specifically.     CRITICAL  CARE Crititicalcare 40 minutes Performed by: Lauralyn Primes pulmonary and criticalcare   T

## 2018-03-15 ENCOUNTER — Inpatient Hospital Stay (HOSPITAL_COMMUNITY): Payer: Medicare Other

## 2018-03-15 DIAGNOSIS — N186 End stage renal disease: Secondary | ICD-10-CM

## 2018-03-15 LAB — BASIC METABOLIC PANEL
ANION GAP: 14 (ref 5–15)
BUN: 60 mg/dL — AB (ref 6–20)
CHLORIDE: 90 mmol/L — AB (ref 101–111)
CO2: 26 mmol/L (ref 22–32)
Calcium: 8.6 mg/dL — ABNORMAL LOW (ref 8.9–10.3)
Creatinine, Ser: 9.75 mg/dL — ABNORMAL HIGH (ref 0.61–1.24)
GFR calc Af Amer: 6 mL/min — ABNORMAL LOW (ref >60)
GFR, EST NON AFRICAN AMERICAN: 5 mL/min — AB (ref >60)
GLUCOSE: 111 mg/dL — AB (ref 65–99)
POTASSIUM: 5.1 mmol/L (ref 3.5–5.1)
SODIUM: 130 mmol/L — AB (ref 135–145)

## 2018-03-15 LAB — GLUCOSE, CAPILLARY
GLUCOSE-CAPILLARY: 101 mg/dL — AB (ref 65–99)
GLUCOSE-CAPILLARY: 105 mg/dL — AB (ref 65–99)
GLUCOSE-CAPILLARY: 76 mg/dL (ref 65–99)
GLUCOSE-CAPILLARY: 97 mg/dL (ref 65–99)
GLUCOSE-CAPILLARY: 99 mg/dL (ref 65–99)
Glucose-Capillary: 92 mg/dL (ref 65–99)
Glucose-Capillary: 72 mg/dL (ref 65–99)
Glucose-Capillary: 104 mg/dL — ABNORMAL HIGH (ref 65–99)

## 2018-03-15 MED ORDER — CHLORHEXIDINE GLUCONATE 0.12 % MT SOLN
15.0000 mL | Freq: Two times a day (BID) | OROMUCOSAL | Status: DC
  Administered 2018-03-15 – 2018-03-26 (×19): 15 mL via OROMUCOSAL
  Filled 2018-03-15 (×13): qty 15

## 2018-03-15 MED ORDER — ORAL CARE MOUTH RINSE
15.0000 mL | Freq: Two times a day (BID) | OROMUCOSAL | Status: DC
  Administered 2018-03-16 – 2018-03-24 (×15): 15 mL via OROMUCOSAL

## 2018-03-15 MED ORDER — DEXTROSE 10 % IV SOLN
INTRAVENOUS | Status: DC
  Administered 2018-03-15: 16:00:00 via INTRAVENOUS
  Administered 2018-03-18: 25 mL/h via INTRAVENOUS
  Administered 2018-03-20: 02:00:00 via INTRAVENOUS

## 2018-03-15 MED ORDER — NOREPINEPHRINE 4 MG/250ML-% IV SOLN
0.0000 ug/min | INTRAVENOUS | Status: DC
  Filled 2018-03-15: qty 250

## 2018-03-15 MED ORDER — FENTANYL CITRATE (PF) 100 MCG/2ML IJ SOLN
12.5000 ug | INTRAMUSCULAR | Status: DC | PRN
  Administered 2018-03-17 – 2018-03-19 (×9): 25 ug via INTRAVENOUS
  Filled 2018-03-15 (×10): qty 2

## 2018-03-15 NOTE — Progress Notes (Signed)
[facility] NEPHROLOGY PROGRESS NOTE  Assessment/ Plan: Pt is a 62 y.o. yo male   with hx of HTN, combined sdCHF and ESRD on chronic hemodialysis ([place] T[facility]), with PEA cardiac arrest, s/p cooling procedure.  Dialysis: [facility] tts 4h 8mn 84kg 2/2 bath Hep 2400 LUA AVF - venofer 50 / wk - mircera 75 ug q 2wks - calcitriol 2.75 ug tiw - sensipar 90 po tiw   Assessment/Plan:  # PEA cardiac arrest s/p cooling procedure.  #Acute respiratory failure/pulmonary edema: Currently intubated.  Per critical care.  # ESRD: last HD on 5/18, plan for another dialysis tomorrow. Today, K 5.1.  # Anemia: CBC and iron stores.  Last hemoglobin was 9.5.  # Secondary hyperparathyroidism: Calcium 8.6.  Check phosphorus level.  Early on tube feeding.  # HTN/volume: Blood pressure is soft.  Not on antihypertensive.  Continue to monitor.  Subjective: Seen and examined at bedside.  Currently intubated.  Not responding. Objective Vital signs in last 24 hours: Vitals:   03/15/18 0500 03/15/18 0600 03/15/18 0700 03/15/18 0755  BP: 117/72 121/60 110/68   Pulse: 96  88   Resp: 18 18 18    Temp:    99.5 F (37.5 C)  TempSrc:      SpO2: 100%  99% 100%  Weight: 88.3 kg (194 lb 10.7 oz)     Height:       Weight change: -2.7 kg (-5 lb 15.2 oz)  Intake/Output Summary (Last 24 hours) at 03/15/2018 1014 Last data filed at 03/15/2018 0600 Gross per 24 hour  Intake 1998.25 ml  Output 1700 ml  Net 298.25 ml       Labs: Basic Metabolic Panel: Recent Labs  Lab 03/12/18 0328 03/12/18 1719 03/13/18 0518 03/14/18 1216 03/15/18 0426  NA 132*  --  131* 132* 130*  K 4.9  --  4.5 4.9 5.1  CL 94*  --  91* 93* 90*  CO2 25  --  28 28 26   GLUCOSE 95  --  132* 137* 111*  BUN 31*  --  24* 40* 60*  CREATININE 9.15*  --  7.25* 8.65* 9.75*  CALCIUM 8.6*  --  8.1* 8.7* 8.6*  PHOS 7.2* 3.7 5.4*  --   --    Liver Function Tests: Recent Labs  Lab 03/09/18 1114  AST 34  ALT 21  ALKPHOS 46   BILITOT 0.5  PROT 6.7  ALBUMIN 3.0*   No results for input(s): LIPASE, AMYLASE in the last 168 hours. No results for input(s): AMMONIA in the last 168 hours. CBC: Recent Labs  Lab 03/09/18 0517  03/09/18 1114  03/10/18 0329 03/11/18 0212 03/12/18 0328 03/13/18 0518  WBC 8.5  --  15.5*  --  8.9 9.3 12.0* 11.8*  NEUTROABS 4.4  --   --   --   --   --   --   --   HGB 10.9*   < > 10.8*   < > 11.6* 11.3* 10.6* 9.5*  HCT 31.4*   < > 31.5*   < > 33.1* 33.6* 31.2* 27.7*  MCV 96.9  --  96.9  --  94.0 97.7 96.6 96.5  PLT 220  --  232  --  269 225 206 199   < > = values in this interval not displayed.   Cardiac Enzymes: Recent Labs  Lab 03/09/18 1114 03/09/18 1848 03/09/18 2239 03/10/18 0329  TROPONINI 0.38* 0.76* 0.71* 0.64*   CBG: Recent Labs  Lab 03/14/18 1611 03/14/18 2014  03/14/18 2353 03/15/18 0353 03/15/18 0800  GLUCAP 115* 126* 101* 92 72    Iron Studies: No results for input(s): IRON, TIBC, TRANSFERRIN, FERRITIN in the last 72 hours. Studies/Results: Dg Abd 1 View  Result Date: 03/15/2018 CLINICAL DATA:  OG tube placement EXAM: ABDOMEN - 1 VIEW COMPARISON:  03/14/2018 FINDINGS: Esophageal tube tip and side port are in the left upper quadrant overlying expected location of proximal stomach. Abdomen is relatively gasless. Vascular calcification IMPRESSION: 1. Esophageal tube tip and side port overlie the gastric fundal region 2. Relatively gasless abdomen Electronically Signed   By: Donavan Foil M.D.   On: 03/15/2018 03:37   Ct Head Wo Contrast  Result Date: 03/15/2018 CLINICAL DATA:  Initial evaluation for acute change in mental status. EXAM: CT HEAD WITHOUT CONTRAST TECHNIQUE: Contiguous axial images were obtained from the base of the skull through the vertex without intravenous contrast. COMPARISON:  Prior CT from 12/01/2017. FINDINGS: Brain: Advanced cerebral and cerebellar atrophy with chronic small vessel ischemic change. Remote acute or infarcts noted within  the bilateral basal ganglia. No acute intracranial hemorrhage. No acute large vessel territory infarct. No mass lesion, midline shift or mass effect. Ventricular prominence related to global parenchymal volume loss without hydrocephalus. No extra-axial fluid collection. Vascular: No hyperdense vessel. Extensive calcified intracranial atherosclerosis. Skull: Scalp soft tissues demonstrate no acute abnormality. Calvarium intact. Sinuses/Orbits: Globes and orbital soft tissues within normal limits. Mucosal thickening with air-fluid level within the right maxillary sinus. Scattered mucosal thickening throughout the remainder of the paranasal sinuses. Left mastoid effusion noted. Fluid seen layering within the nasopharynx. Other: None. IMPRESSION: 1. No acute intracranial abnormality. 2. Advanced cerebral and cerebellar atrophy with chronic small vessel ischemic disease with remote bilateral lacunar infarcts involving the basal ganglia. Electronically Signed   By: Jeannine Boga M.D.   On: 03/15/2018 02:57   Dg Abd Portable 1v  Result Date: 03/15/2018 CLINICAL DATA:  Vomiting EXAM: PORTABLE ABDOMEN - 1 VIEW COMPARISON:  03/12/2018 FINDINGS: Esophageal tube tip is in the left upper quadrant. Nonspecific diffuse decreased bowel gas. Vascular calcifications. IMPRESSION: Nonspecific diffuse decreased bowel gas. Electronically Signed   By: Donavan Foil M.D.   On: 03/15/2018 03:37    Medications: Infusions: . sodium chloride Stopped (03/09/18 2142)  . sodium chloride    . sodium chloride Stopped (03/09/18 2142)  . dextrose 50 mL/hr at 03/15/18 0742  . famotidine (PEPCID) IV Stopped (03/14/18 2221)  . feeding supplement (OSMOLITE 1.5 CAL) Stopped (03/14/18 2231)  . fentaNYL infusion INTRAVENOUS 100 mcg/hr (03/15/18 0742)  . labetalol (NORMODYNE) infusion Stopped (03/12/18 0336)  . norepinephrine (LEVOPHED) Adult infusion      Scheduled Medications: . albuterol  2.5 mg Nebulization QID  .  chlorhexidine gluconate (MEDLINE KIT)  15 mL Mouth Rinse BID  . Chlorhexidine Gluconate Cloth  6 each Topical Daily  . feeding supplement (PRO-STAT SUGAR FREE 64)  60 mL Per Tube BID  . heparin  5,000 Units Subcutaneous Q8H  . mouth rinse  15 mL Mouth Rinse 10 times per day  . metoprolol tartrate  12.5 mg Oral BID  . promethazine-codeine  5 mL Oral TID  . scopolamine  1 patch Transdermal Q72H  . sodium chloride flush  10-40 mL Intracatheter Q12H    have reviewed scheduled and prn medications.  Physical Exam: General: Responsive, intubated Heart:RRR, s1s2 nl Lungs: Coarse breath sound bilateral, Abdomen:soft, Non-tender, non-distended Extremities:No edema Dialysis Access: Left upper extremity AV fistula has good bruit and thrill.  Alysha Doolan Tanna Furry  03/15/2018,10:14 AM  LOS: 6 days

## 2018-03-15 NOTE — Plan of Care (Signed)
Pt maintaining O2 sats w/ ventilator support. Pt BP WDL but Heart Rhythm continues to be A-fib (Intermittently RVR). Pt responding to pain in all four extremities w/ neurological deficits noted (see chart). Pt being turned Q2 in order to prevent skin breakdown. Pt tube feedings were stopped r/t emesis x 2. OG to Low Intermittent Suction (1.7 Liters noted).

## 2018-03-15 NOTE — Progress Notes (Signed)
PULMONARY / CRITICAL CARE MEDICINE   Name: Kenneth [name] MRN: [id number] DOB: [birth year]    ADMISSION DATE:  03/09/2018 CONSULTATION DATE:  03/09/18  REFERRING MD:  Stark Jock EDP  CHIEF COMPLAINT:  Cardiac arrest  HISTORY OF PRESENT ILLNESS:   62 y/o male with ERSD, sCHF who was admitted on 5/14 after having a cardiac arrest in the setting of a PEA arrest and wide complex tachycardia.  Had 10 minutes of CPR.   SUBJECTIVE:  New R gaze deviation yesterday, head CT > no acute abnormality, advanced atrophy with small vesel ischemic disease  VITAL SIGNS: BP 105/68   Pulse (!) 102   Temp 99.5 F (37.5 C)   Resp 18   Ht 5\' 10"  (1.778 m)   Wt 88.3 kg (194 lb 10.7 oz)   SpO2 99%   BMI 27.93 kg/m   HEMODYNAMICS: CVP:  [2 mmHg-14 mmHg] 6 mmHg  VENTILATOR SETTINGS: Vent Mode: PSV FiO2 (%):  [40 %-50 %] 40 % Set Rate:  [18 bmp] 18 bmp Vt Set:  [600 mL] 600 mL PEEP:  [5 cmH20] 5 cmH20 Pressure Support:  [5 cmH20] 5 cmH20 Plateau Pressure:  [16 cmH20-18 cmH20] 18 cmH20  INTAKE / OUTPUT: I/O last 3 completed shifts: In: 3829.4 [I.V.:2251.2; NG/GT:1478.3; IV Piggyback:100] Out: 3174 [Emesis/NG output:1700; WUGQB:1694]  PHYSICAL EXAMINATION:  General:  In bed on vent HENT: NCAT ETT in place PULM: CTA B, vent supported breathing CV: RRR, no mgr GI: BS+, soft, nontender MSK: normal bulk and tone Neuro:sleepy, will follow commands very slowly, not raising hands or head per command   LABS:  BMET Recent Labs  Lab 03/13/18 0518 03/14/18 1216 03/15/18 0426  NA 131* 132* 130*  K 4.5 4.9 5.1  CL 91* 93* 90*  CO2 28 28 26   BUN 24* 40* 60*  CREATININE 7.25* 8.65* 9.75*  GLUCOSE 132* 137* 111*    Electrolytes Recent Labs  Lab 03/12/18 0328 03/12/18 1719 03/13/18 0518 03/14/18 1216 03/15/18 0426  CALCIUM 8.6*  --  8.1* 8.7* 8.6*  MG 1.9 1.7 1.9 2.3  --   PHOS 7.2* 3.7 5.4*  --   --     CBC Recent Labs  Lab 03/11/18 0212 03/12/18 0328 03/13/18 0518  WBC 9.3 12.0*  11.8*  HGB 11.3* 10.6* 9.5*  HCT 33.6* 31.2* 27.7*  PLT 225 206 199    Coag's Recent Labs  Lab 03/09/18 1412 03/09/18 2030  INR 1.06 1.15    Sepsis Markers Recent Labs  Lab 03/09/18 0946 03/09/18 1112 03/09/18 1121  LATICACIDVEN 8.26* 1.0 0.87    ABG Recent Labs  Lab 03/10/18 0340 03/11/18 0804 03/11/18 2051  PHART 7.390 7.413 7.420  PCO2ART 43.7 43.0 45.4  PO2ART 118* 76.0* 69.0*    Liver Enzymes Recent Labs  Lab 03/09/18 1114  AST 34  ALT 21  ALKPHOS 46  BILITOT 0.5  ALBUMIN 3.0*    Cardiac Enzymes Recent Labs  Lab 03/09/18 1848 03/09/18 2239 03/10/18 0329  TROPONINI 0.76* 0.71* 0.64*    Glucose Recent Labs  Lab 03/14/18 1200 03/14/18 1611 03/14/18 2014 03/14/18 2353 03/15/18 0353 03/15/18 0800  GLUCAP 124* 115* 126* 101* 92 72    Imaging Dg Abd 1 View  Result Date: 03/15/2018 CLINICAL DATA:  OG tube placement EXAM: ABDOMEN - 1 VIEW COMPARISON:  03/14/2018 FINDINGS: Esophageal tube tip and side port are in the left upper quadrant overlying expected location of proximal stomach. Abdomen is relatively gasless. Vascular calcification IMPRESSION: 1. Esophageal tube tip and side  port overlie the gastric fundal region 2. Relatively gasless abdomen Electronically Signed   By: Donavan Foil M.D.   On: 03/15/2018 03:37   Ct Head Wo Contrast  Result Date: 03/15/2018 CLINICAL DATA:  Initial evaluation for acute change in mental status. EXAM: CT HEAD WITHOUT CONTRAST TECHNIQUE: Contiguous axial images were obtained from the base of the skull through the vertex without intravenous contrast. COMPARISON:  Prior CT from 12/01/2017. FINDINGS: Brain: Advanced cerebral and cerebellar atrophy with chronic small vessel ischemic change. Remote acute or infarcts noted within the bilateral basal ganglia. No acute intracranial hemorrhage. No acute large vessel territory infarct. No mass lesion, midline shift or mass effect. Ventricular prominence related to global  parenchymal volume loss without hydrocephalus. No extra-axial fluid collection. Vascular: No hyperdense vessel. Extensive calcified intracranial atherosclerosis. Skull: Scalp soft tissues demonstrate no acute abnormality. Calvarium intact. Sinuses/Orbits: Globes and orbital soft tissues within normal limits. Mucosal thickening with air-fluid level within the right maxillary sinus. Scattered mucosal thickening throughout the remainder of the paranasal sinuses. Left mastoid effusion noted. Fluid seen layering within the nasopharynx. Other: None. IMPRESSION: 1. No acute intracranial abnormality. 2. Advanced cerebral and cerebellar atrophy with chronic small vessel ischemic disease with remote bilateral lacunar infarcts involving the basal ganglia. Electronically Signed   By: Jeannine Boga M.D.   On: 03/15/2018 02:57   Dg Abd Portable 1v  Result Date: 03/15/2018 CLINICAL DATA:  Vomiting EXAM: PORTABLE ABDOMEN - 1 VIEW COMPARISON:  03/12/2018 FINDINGS: Esophageal tube tip is in the left upper quadrant. Nonspecific diffuse decreased bowel gas. Vascular calcifications. IMPRESSION: Nonspecific diffuse decreased bowel gas. Electronically Signed   By: Donavan Foil M.D.   On: 03/15/2018 03:37     STUDIES:  5/14 TTE> Severe LVH, LVEF 93-79%, grade 2 diastolic dysfunction, moderately decreased RV systolic funcion 0/24  head CT > no acute abnormality, advanced atrophy with small vesel ischemic disease  CULTURES:   ANTIBIOTICS:   SIGNIFICANT EVENTS:   LINES/TUBES: 5/14 ETT   DISCUSSION: 62 y/o male with a cardiac arrest on 5/14 in the setting of hyperkalemia and acute pulmonary edema.  He has HFpEF, severe LVH and ESRD.   ASSESSMENT / PLAN:  PULMONARY A: Acute respiratory failure with hypoxemia P:   Full mechanical vent support VAP prevention Daily WUA/SBT Near extubation 5/20, need to see him more awake prior to extubation  CARDIOVASCULAR A:  Severe LVH, HFpEF Hypertension  baseline, normal BP here off treatment Afib, non-compliant with anticoagulation at home P:  Tele Volume removal per renal Hold antihypertensives and anticoagulation  RENAL A:   ESRD P:   HD per renal  GASTROINTESTINAL A:   Nausea/vomiting P:   Tube feeding per protocol > on hold for vomiting pepcid for stress ulcer prophylaxis  HEMATOLOGIC A:   Anemia without bleeding P:  Monitor for bleeding Transfuse for Hgb 7 gm/dL  INFECTIOUS A:   No acute issues P:   Monitor for fever  ENDOCRINE A:   No acute issues P:   Monitor glucose  NEUROLOGIC A:   No acute issues P:   Stop sedation  Stop codeine/phenergan If back on full vent support then resume PAD protocol RASS goal 0    FAMILY  - Updates: none bedside  - Inter-disciplinary family meet or Palliative Care meeting due by:  Day 7  My cc time 35 minutes  Roselie Awkward, MD [facility] Pager: [phone] Cell: [phone] After 3pm or if no response, call [phone]   03/15/2018, 11:23 AM

## 2018-03-15 NOTE — Progress Notes (Signed)
Approximately 230 cc's of fentanyl wasted down sink, witnessed by [employer].

## 2018-03-15 NOTE — Procedures (Signed)
Extubation Procedure Note  Patient Details:   Name: Kenneth [name] DOB: 02/03/1956 MRN: [id number]   Airway Documentation:    Vent end date: 03/15/18 Vent end time: 1555   Evaluation  O2 sats: stable throughout Complications: No apparent complications Patient did tolerate procedure well. Bilateral Breath Sounds: Rhonchi   Yes  4l/min Caroline placed and Incentive spirometer performed 666ml's Vocal effort weak, RN aware.  Revonda Standard 03/15/2018, 3:56 PM

## 2018-03-15 NOTE — Progress Notes (Signed)
Patient was transported to CT and back without any complications. The patient is back in his room and resting on ordered vent settings

## 2018-03-15 NOTE — Progress Notes (Signed)
eLink Physician-Brief Progress Note Patient Name: Kenneth [name] DOB: [birth year] MRN: [id number]   Date of Service  03/15/2018  HPI/Events of Note  New neurological finding - Patient wean from sedation and has new R gaze deviation.   eICU Interventions  Will order: 1. Head CT Scan without contrast now.      Intervention Category Major Interventions: Change in mental status - evaluation and management  Lashayla Armes Eugene 03/15/2018, 12:11 AM

## 2018-03-16 ENCOUNTER — Inpatient Hospital Stay (HOSPITAL_COMMUNITY): Payer: Medicare Other

## 2018-03-16 LAB — CBC
HEMATOCRIT: 27.9 % — AB (ref 39.0–52.0)
Hemoglobin: 9.5 g/dL — ABNORMAL LOW (ref 13.0–17.0)
MCH: 32.5 pg (ref 26.0–34.0)
MCHC: 34.1 g/dL (ref 30.0–36.0)
MCV: 95.5 fL (ref 78.0–100.0)
Platelets: 260 10*3/uL (ref 150–400)
RBC: 2.92 MIL/uL — ABNORMAL LOW (ref 4.22–5.81)
RDW: 13.8 % (ref 11.5–15.5)
WBC: 13.7 10*3/uL — AB (ref 4.0–10.5)

## 2018-03-16 LAB — GLUCOSE, CAPILLARY
GLUCOSE-CAPILLARY: 103 mg/dL — AB (ref 65–99)
GLUCOSE-CAPILLARY: 101 mg/dL — AB (ref 65–99)
GLUCOSE-CAPILLARY: 95 mg/dL (ref 65–99)
Glucose-Capillary: 93 mg/dL (ref 65–99)
Glucose-Capillary: 95 mg/dL (ref 65–99)

## 2018-03-16 LAB — IRON AND TIBC
Iron: 32 ug/dL — ABNORMAL LOW (ref 45–182)
Saturation Ratios: 20 % (ref 17.9–39.5)
TIBC: 161 ug/dL — ABNORMAL LOW (ref 250–450)
UIBC: 129 ug/dL

## 2018-03-16 LAB — RENAL FUNCTION PANEL
ALBUMIN: 2.3 g/dL — AB (ref 3.5–5.0)
Anion gap: 18 — ABNORMAL HIGH (ref 5–15)
BUN: 78 mg/dL — ABNORMAL HIGH (ref 6–20)
CALCIUM: 8.6 mg/dL — AB (ref 8.9–10.3)
CO2: 23 mmol/L (ref 22–32)
CREATININE: 12.43 mg/dL — AB (ref 0.61–1.24)
Chloride: 88 mmol/L — ABNORMAL LOW (ref 101–111)
GFR calc Af Amer: 4 mL/min — ABNORMAL LOW (ref >60)
GFR, EST NON AFRICAN AMERICAN: 4 mL/min — AB (ref >60)
GLUCOSE: 102 mg/dL — AB (ref 65–99)
PHOSPHORUS: 7.9 mg/dL — AB (ref 2.5–4.6)
POTASSIUM: 4.7 mmol/L (ref 3.5–5.1)
SODIUM: 129 mmol/L — AB (ref 135–145)

## 2018-03-16 LAB — FERRITIN: Ferritin: 2212 ng/mL — ABNORMAL HIGH (ref 24–336)

## 2018-03-16 MED ORDER — LEVALBUTEROL HCL 0.63 MG/3ML IN NEBU
0.6300 mg | INHALATION_SOLUTION | Freq: Three times a day (TID) | RESPIRATORY_TRACT | Status: DC
Start: 2018-03-16 — End: 2018-03-23
  Administered 2018-03-16 – 2018-03-22 (×20): 0.63 mg via RESPIRATORY_TRACT
  Filled 2018-03-16 (×21): qty 3

## 2018-03-16 MED ORDER — METOPROLOL TARTRATE 5 MG/5ML IV SOLN
5.0000 mg | Freq: Four times a day (QID) | INTRAVENOUS | Status: DC
Start: 2018-03-17 — End: 2018-03-17
  Administered 2018-03-17 (×2): 5 mg via INTRAVENOUS
  Filled 2018-03-16 (×2): qty 5

## 2018-03-16 MED ORDER — METOPROLOL TARTRATE 5 MG/5ML IV SOLN
2.5000 mg | Freq: Four times a day (QID) | INTRAVENOUS | Status: DC
  Administered 2018-03-16 (×2): 2.5 mg via INTRAVENOUS
  Filled 2018-03-16 (×2): qty 5

## 2018-03-16 MED ORDER — DARBEPOETIN ALFA 40 MCG/0.4ML IJ SOSY
40.0000 ug | PREFILLED_SYRINGE | INTRAMUSCULAR | Status: DC
  Administered 2018-03-16 – 2018-03-23 (×2): 40 ug via INTRAVENOUS
  Filled 2018-03-16 (×3): qty 0.4

## 2018-03-16 MED ORDER — LIDOCAINE-PRILOCAINE 2.5-2.5 % EX CREA
1.0000 "application " | TOPICAL_CREAM | CUTANEOUS | Status: DC | PRN
  Filled 2018-03-16: qty 5

## 2018-03-16 MED ORDER — SODIUM CHLORIDE 0.9 % IV SOLN: 100.0000 mL | INTRAVENOUS | Status: DC | PRN

## 2018-03-16 MED ORDER — LEVALBUTEROL HCL 0.63 MG/3ML IN NEBU: 0.6300 mg | INHALATION_SOLUTION | Freq: Three times a day (TID) | RESPIRATORY_TRACT | Status: DC

## 2018-03-16 MED ORDER — ASPIRIN EC 325 MG PO TBEC
325.0000 mg | DELAYED_RELEASE_TABLET | Freq: Every day | ORAL | Status: DC
  Administered 2018-03-18: 325 mg via ORAL

## 2018-03-16 MED ORDER — DILTIAZEM HCL-DEXTROSE 100-5 MG/100ML-% IV SOLN (PREMIX): 5.0000 mg/h | INTRAVENOUS | Status: DC

## 2018-03-16 MED ORDER — ALTEPLASE 2 MG IJ SOLR: 2.0000 mg | Freq: Once | INTRAMUSCULAR | Status: DC | PRN

## 2018-03-16 MED ORDER — LIDOCAINE HCL (PF) 1 % IJ SOLN: 5.0000 mL | INTRAMUSCULAR | Status: DC | PRN

## 2018-03-16 MED ORDER — PENTAFLUOROPROP-TETRAFLUOROETH EX AERO: 1.0000 "application " | INHALATION_SPRAY | CUTANEOUS | Status: DC | PRN

## 2018-03-16 MED ORDER — HEPARIN SODIUM (PORCINE) 1000 UNIT/ML DIALYSIS: 1000.0000 [IU] | INTRAMUSCULAR | Status: DC | PRN

## 2018-03-16 MED ORDER — SODIUM CHLORIDE 0.9 % IV SOLN
100.0000 mL | INTRAVENOUS | Status: DC | PRN
Start: 2018-03-16 — End: 2018-03-20

## 2018-03-16 MED ORDER — SODIUM CHLORIDE 0.9 % IV SOLN: 125.0000 mg | INTRAVENOUS | Status: DC

## 2018-03-16 NOTE — Progress Notes (Addendum)
[facility] NEPHROLOGY PROGRESS NOTE  Assessment/ Plan: Pt is a 62 y.o. yo male   with hx of HTN, combined sdCHF and ESRD on chronic hemodialysis ([place] [facility]), with PEA cardiac arrest, s/p cooling procedure.  Dialysis: [facility] tts 4h 54min 84kg 2/2 bath Hep 2400 LUA AVF - venofer 50 / wk - mircera 75 ug q 2wks - calcitriol 2.75 ug tiw - sensipar 90 po tiw   Assessment/Plan:  # PEA cardiac arrest s/p cooling procedure.  #Acute respiratory failure/pulmonary edema: Extubated.  Currently on oxygen via nasal cannula.   Per critical care.  # ESRD: Plan for dialysis today with 2K bath, 1 to 2 kg ultrafiltration as tolerated.  He has left upper extremity AV fistula.  # Anemia: has high ferritin level, start Aranesp weekly.  Monitor CBC.  # Secondary hyperparathyroidism: Phosphorus elevated, plan to start binders when patient takes orally.  He is currently n.p.o.  # HTN/volume: Blood pressure is soft.  Not on antihypertensive.  Continue to monitor.  Subjective: Seen and examined at bedside.  Extubated, following simple commands. Objective Vital signs in last 24 hours: Vitals:   03/16/18 0700 03/16/18 0749 03/16/18 0800 03/16/18 0806  BP: 136/90 136/90 132/63   Pulse: (!) 103 (!) 114 (!) 119   Resp: (!) 22 (!) 21 (!) 21   Temp:    98.3 F (36.8 C)  TempSrc:    Oral  SpO2: 95% 98% 99%   Weight:      Height:       Weight change: -0.3 kg (-10.6 oz)  Intake/Output Summary (Last 24 hours) at 03/16/2018 0844 Last data filed at 03/16/2018 0800 Gross per 24 hour  Intake 866.67 ml  Output 475 ml  Net 391.67 ml       Labs: Basic Metabolic Panel: Recent Labs  Lab 03/12/18 1719 03/13/18 0518 03/14/18 1216 03/15/18 0426 03/16/18 0359  NA  --  131* 132* 130* 129*  K  --  4.5 4.9 5.1 4.7  CL  --  91* 93* 90* 88*  CO2  --  28 28 26 23   GLUCOSE  --  132* 137* 111* 102*  BUN  --  24* 40* 60* 78*  CREATININE  --  7.25* 8.65* 9.75* 12.43*  CALCIUM  --  8.1* 8.7*  8.6* 8.6*  PHOS 3.7 5.4*  --   --  7.9*   Liver Function Tests: Recent Labs  Lab 03/09/18 1114 03/16/18 0359  AST 34  --   ALT 21  --   ALKPHOS 46  --   BILITOT 0.5  --   PROT 6.7  --   ALBUMIN 3.0* 2.3*   No results for input(s): LIPASE, AMYLASE in the last 168 hours. No results for input(s): AMMONIA in the last 168 hours. CBC: Recent Labs  Lab 03/10/18 0329 03/11/18 0212 03/12/18 0328 03/13/18 0518 03/16/18 0359  WBC 8.9 9.3 12.0* 11.8* 13.7*  HGB 11.6* 11.3* 10.6* 9.5* 9.5*  HCT 33.1* 33.6* 31.2* 27.7* 27.9*  MCV 94.0 97.7 96.6 96.5 95.5  PLT 269 225 206 199 260   Cardiac Enzymes: Recent Labs  Lab 03/09/18 1114 03/09/18 1848 03/09/18 2239 03/10/18 0329  TROPONINI 0.38* 0.76* 0.71* 0.64*   CBG: Recent Labs  Lab 03/15/18 1706 03/15/18 1959 03/15/18 2355 03/16/18 0350 03/16/18 0811  GLUCAP 99 105* 95 103* 101*    Iron Studies:  Recent Labs    03/16/18 0359  IRON 32*  TIBC 161*  FERRITIN 2,212*   Studies/Results: Dg Abd  1 View  Result Date: 03/15/2018 CLINICAL DATA:  OG tube placement EXAM: ABDOMEN - 1 VIEW COMPARISON:  03/14/2018 FINDINGS: Esophageal tube tip and side port are in the left upper quadrant overlying expected location of proximal stomach. Abdomen is relatively gasless. Vascular calcification IMPRESSION: 1. Esophageal tube tip and side port overlie the gastric fundal region 2. Relatively gasless abdomen Electronically Signed   By: Donavan Foil M.D.   On: 03/15/2018 03:37   Ct Head Wo Contrast  Result Date: 03/15/2018 CLINICAL DATA:  Initial evaluation for acute change in mental status. EXAM: CT HEAD WITHOUT CONTRAST TECHNIQUE: Contiguous axial images were obtained from the base of the skull through the vertex without intravenous contrast. COMPARISON:  Prior CT from 12/01/2017. FINDINGS: Brain: Advanced cerebral and cerebellar atrophy with chronic small vessel ischemic change. Remote acute or infarcts noted within the bilateral basal  ganglia. No acute intracranial hemorrhage. No acute large vessel territory infarct. No mass lesion, midline shift or mass effect. Ventricular prominence related to global parenchymal volume loss without hydrocephalus. No extra-axial fluid collection. Vascular: No hyperdense vessel. Extensive calcified intracranial atherosclerosis. Skull: Scalp soft tissues demonstrate no acute abnormality. Calvarium intact. Sinuses/Orbits: Globes and orbital soft tissues within normal limits. Mucosal thickening with air-fluid level within the right maxillary sinus. Scattered mucosal thickening throughout the remainder of the paranasal sinuses. Left mastoid effusion noted. Fluid seen layering within the nasopharynx. Other: None. IMPRESSION: 1. No acute intracranial abnormality. 2. Advanced cerebral and cerebellar atrophy with chronic small vessel ischemic disease with remote bilateral lacunar infarcts involving the basal ganglia. Electronically Signed   By: Jeannine Boga M.D.   On: 03/15/2018 02:57   Dg Abd Portable 1v  Result Date: 03/15/2018 CLINICAL DATA:  Vomiting EXAM: PORTABLE ABDOMEN - 1 VIEW COMPARISON:  03/12/2018 FINDINGS: Esophageal tube tip is in the left upper quadrant. Nonspecific diffuse decreased bowel gas. Vascular calcifications. IMPRESSION: Nonspecific diffuse decreased bowel gas. Electronically Signed   By: Donavan Foil M.D.   On: 03/15/2018 03:37    Medications: Infusions: . sodium chloride Stopped (03/09/18 2142)  . dextrose 25 mL/hr at 03/15/18 1532  . famotidine (PEPCID) IV Stopped (03/15/18 2314)  . fentaNYL infusion INTRAVENOUS Stopped (03/15/18 0800)  . labetalol (NORMODYNE) infusion Stopped (03/12/18 0336)  . norepinephrine (LEVOPHED) Adult infusion      Scheduled Medications: . albuterol  2.5 mg Nebulization QID  . chlorhexidine  15 mL Mouth Rinse BID  . Chlorhexidine Gluconate Cloth  6 each Topical Daily  . heparin  5,000 Units Subcutaneous Q8H  . mouth rinse  15 mL Mouth  Rinse q12n4p  . metoprolol tartrate  12.5 mg Oral BID  . scopolamine  1 patch Transdermal Q72H  . sodium chloride flush  10-40 mL Intracatheter Q12H    have reviewed scheduled and prn medications.  Physical Exam: General: Extubated, following commands, not in distress Heart: Regular tachycardic, s1s2 nl Lungs: Coarse breath sound bilateral, unchanged Abdomen:soft, Non-tender, non-distended Extremities:No edema Dialysis Access: Left upper extremity AV fistula has good bruit and thrill.  Dron Prasad Bhandari 03/16/2018,8:44 AM  LOS: 7 days

## 2018-03-16 NOTE — Progress Notes (Signed)
Nutrition Follow-up  DOCUMENTATION CODES:   Not applicable  INTERVENTION:   Recommend:  Cortrak tube Nepro @ 45 ml/hr  30 ml Prostat BID Provides: 2144 kcal, 117 grams protein, and 785 ml free water   NUTRITION DIAGNOSIS:   Increased nutrient needs related to (dialysis ) as evidenced by estimated needs. Ongoing.   GOAL:   Patient will meet greater than or equal to 90% of their needs  Not met.  MONITOR:   TF tolerance, Labs  ASSESSMENT:   Pt with PMH of ETOH abuse, HTN, CHF, ESRD on HD TTS admitted with SOB and had a PEA arrest in ED. Pt started on 36 degree protocol.   5/20 extubated 5/21 failed swallow eval    Per MD notes pt with severe LVH and HF Spoke with RN, pt is very lethargic today, has just started HD  Medications reviewed Labs reviewed: Na 129 (L), PO4 7.9 (H)   Diet Order:   Diet Order           Diet NPO time specified  Diet effective now          EDUCATION NEEDS:   No education needs have been identified at this time  Skin:  Skin Assessment: Reviewed RN Assessment  Last BM:  5/20  Height:   Ht Readings from Last 1 Encounters:  03/13/18 5' 10"  (1.778 m)    Weight:   Wt Readings from Last 1 Encounters:  03/16/18 194 lb 0.1 oz (88 kg)    Ideal Body Weight:  78.1 kg  BMI:  Body mass index is 27.84 kg/m.  Estimated Nutritional Needs:   Kcal:  2100-2300  Protein:  106-124  Fluid:  1.2 L/day  Maylon Peppers RD, LDN, CNSC [phone] Pager [phone] After Hours Pager

## 2018-03-16 NOTE — Evaluation (Signed)
Clinical/Bedside Swallow Evaluation Patient Details  Name: Kenneth [name] MRN: [id number] Date of Birth: Mar 16, 1956  Today's Date: 03/16/2018 Time: SLP Start Time (ACUTE ONLY): 4627 SLP Stop Time (ACUTE ONLY): 1451 SLP Time Calculation (min) (ACUTE ONLY): 9 min  Past Medical History:  Past Medical History:  Diagnosis Date  . Acute CHF ([place]) 01/2018  . Cardiomyopathy secondary    likely related to HTN heart disease; possibly ETOH related as well  . Chronic combined systolic and diastolic heart failure (HCC)    Echocardiogram 09/22/11: Moderate LVH, EF 03-50%, grade 3 diastolic dysfunction, mild MR, moderate to severe LAE, mild RVE, mild to moderate TR, small to moderate pericardial effusion  . ESRD (end stage renal disease) on dialysis [facility])    due to hypertensive nephrosclerosis  . History of alcohol abuse   . Hypertension    Past Surgical History:  Past Surgical History:  Procedure Laterality Date  . AV FISTULA PLACEMENT Left 05/14/2016   Procedure: LEFT ARM BASILIC VEIN TRANSPOSITION;  Surgeon: Rosetta Posner, MD;  Location: [facility];  Service: Vascular;  Laterality: Left;  . PERIPHERAL VASCULAR CATHETERIZATION N/A 05/13/2016   Procedure: Dialysis/Perma Catheter Insertion;  Surgeon: Serafina Mitchell, MD;  Location: [facility];  Service: Cardiovascular;  Laterality: N/A;   HPI:  62 y/o male admitted on 5/14 after having a cardiac arrest in the setting of a PEA arrest and wide complex tachycardia.  Had 10 minutes of CPR. ETT 5/14-5/20. CT Head showed no acute changes but advanced cerebral and cerebellar atrophy, remote bilateral lacunar infarcts involving the basal ganglia. PMH:  ERSD, sCHF, EtOH abuse, HTN. Pt had a BSE in July 2017 recommending Dys 3 diet and thin liquids by cup due to cognitively-based dysphagia. He was advanced to regular textures and thin liquids by cup or straw prior to d/c.   Assessment / Plan / Recommendation Clinical Impression  Pt is lethargic, aphonic, and  showing signs of dysphagia. His oral status appears to be impacted primarily by his mentation, with poor awareness for bolus acceptance followed by oral holding. He also has evidence of a post-extubation dysphagia due to poor vocal quality, immediate and delayed coughing with ice chips, and weak cough. Hyolaryngeal movement is noted upon palpation, although minimally. Recommend that pt remain NPO. Will f/u for readiness to complete FEES. SLP Visit Diagnosis: Dysphagia, unspecified (R13.10)    Aspiration Risk  Severe aspiration risk    Diet Recommendation NPO;Alternative means - temporary   Medication Administration: Via alternative means    Other  Recommendations Oral Care Recommendations: Oral care QID Other Recommendations: Have oral suction available   Follow up Recommendations (tba)      Frequency and Duration min 2x/week  2 weeks       Prognosis Prognosis for Safe Diet Advancement: Good Barriers to Reach Goals: Cognitive deficits      Swallow Study   General HPI: 62 y/o male admitted on 5/14 after having a cardiac arrest in the setting of a PEA arrest and wide complex tachycardia.  Had 10 minutes of CPR. ETT 5/14-5/20. CT Head showed no acute changes but advanced cerebral and cerebellar atrophy, remote bilateral lacunar infarcts involving the basal ganglia. PMH:  ERSD, sCHF, EtOH abuse, HTN. Pt had a BSE in July 2017 recommending Dys 3 diet and thin liquids by cup due to cognitively-based dysphagia. He was advanced to regular textures and thin liquids by cup or straw prior to d/c. Type of Study: Bedside Swallow Evaluation Previous Swallow Assessment: see  HPI Diet Prior to this Study: NPO Temperature Spikes Noted: Yes(100.4) Respiratory Status: Nasal cannula History of Recent Intubation: Yes Length of Intubations (days): 7 days Date extubated: 03/15/18 Behavior/Cognition: Lethargic/Drowsy;Requires cueing Oral Cavity Assessment: (doesn't open mouth well for assessment) Oral  Care Completed by SLP: Yes Oral Cavity - Dentition: (doesn't open mouth wide to assess) Self-Feeding Abilities: Total assist Patient Positioning: Upright in bed(head wants to lean toward the R) Baseline Vocal Quality: Aphonic Volitional Cough: Weak Volitional Swallow: Unable to elicit    Oral/Motor/Sensory Function Overall Oral Motor/Sensory Function: (doesn't follow commands well to assess)   Ice Chips Ice chips: Impaired Presentation: Spoon Oral Phase Impairments: Poor awareness of bolus Oral Phase Functional Implications: Oral holding Pharyngeal Phase Impairments: Suspected delayed Swallow;Throat Clearing - Immediate   Thin Liquid Thin Liquid: Not tested    Nectar Thick Nectar Thick Liquid: Not tested   Honey Thick Honey Thick Liquid: Not tested   Puree Puree: Not tested   Solid   GO   Solid: Not tested        Germain Osgood 03/16/2018,3:04 PM  Germain Osgood, M.A. CCC-SLP [phone]

## 2018-03-16 NOTE — Progress Notes (Signed)
PULMONARY / CRITICAL CARE MEDICINE   Name: Kenneth [name] MRN: [id number] DOB: 05-17-1956    ADMISSION DATE:  03/09/2018 CONSULTATION DATE:  03/09/18  REFERRING MD:  Stark Jock EDP  CHIEF COMPLAINT:  Cardiac arrest  HISTORY OF PRESENT ILLNESS:   62 y/o male with ERSD, sCHF who was admitted on 5/14 after having a cardiac arrest in the setting of a PEA arrest and wide complex tachycardia.  Had 10 minutes of CPR.   SUBJECTIVE:  Extubated yesterday Did OK  VITAL SIGNS: BP (!) 133/101   Pulse (!) 120   Temp 98.3 F (36.8 C) (Oral)   Resp (!) 22   Ht 5\' 10"  (1.778 m)   Wt 88 kg (194 lb 0.1 oz)   SpO2 100%   BMI 27.84 kg/m   HEMODYNAMICS: CVP:  [0 mmHg-35 mmHg] 3 mmHg  VENTILATOR SETTINGS: Vent Mode: PRVC FiO2 (%):  [40 %] 40 % Set Rate:  [18 bmp] 18 bmp Vt Set:  [600 mL] 600 mL PEEP:  [5 cmH20] 5 cmH20  INTAKE / OUTPUT: I/O last 3 completed shifts: In: 2189.9 [I.V.:1706.7; NG/GT:383.3; IV Piggyback:100] Out: 2175 [Emesis/NG output:2150; Stool:25]  PHYSICAL EXAMINATION:  General:  Resting comfortably in bed HENT: NCAT OP clear PULM: CTA B, normal effort CV: Tachy, irreg, no mgr GI: BS+, soft, nontender MSK: normal bulk and tone Neuro: sleepy but arouses to voice, garbled speech but oriented to hospital, follws commands    LABS:  BMET Recent Labs  Lab 03/14/18 1216 03/15/18 0426 03/16/18 0359  NA 132* 130* 129*  K 4.9 5.1 4.7  CL 93* 90* 88*  CO2 28 26 23   BUN 40* 60* 78*  CREATININE 8.65* 9.75* 12.43*  GLUCOSE 137* 111* 102*    Electrolytes Recent Labs  Lab 03/12/18 1719 03/13/18 0518 03/14/18 1216 03/15/18 0426 03/16/18 0359  CALCIUM  --  8.1* 8.7* 8.6* 8.6*  MG 1.7 1.9 2.3  --   --   PHOS 3.7 5.4*  --   --  7.9*    CBC Recent Labs  Lab 03/12/18 0328 03/13/18 0518 03/16/18 0359  WBC 12.0* 11.8* 13.7*  HGB 10.6* 9.5* 9.5*  HCT 31.2* 27.7* 27.9*  PLT 206 199 260    Coag's Recent Labs  Lab 03/09/18 1412 03/09/18 2030  INR 1.06 1.15     Sepsis Markers No results for input(s): LATICACIDVEN, PROCALCITON, O2SATVEN in the last 168 hours.  ABG Recent Labs  Lab 03/10/18 0340 03/11/18 0804 03/11/18 2051  PHART 7.390 7.413 7.420  PCO2ART 43.7 43.0 45.4  PO2ART 118* 76.0* 69.0*    Liver Enzymes Recent Labs  Lab 03/16/18 0359  ALBUMIN 2.3*    Cardiac Enzymes Recent Labs  Lab 03/09/18 1848 03/09/18 2239 03/10/18 0329  TROPONINI 0.76* 0.71* 0.64*    Glucose Recent Labs  Lab 03/15/18 1233 03/15/18 1706 03/15/18 1959 03/15/18 2355 03/16/18 0350 03/16/18 0811  GLUCAP 76 99 105* 95 103* 101*    Imaging Dg Chest Port 1 View  Result Date: 03/16/2018 CLINICAL DATA:  Acute respiratory failure, extubated yesterday, history of cardiac arrest EXAM: PORTABLE CHEST 1 VIEW COMPARISON:  03/12/2018 FINDINGS: Mild patchy left lower lobe opacity, likely atelectasis. Pulmonary vascular congestion without frank interstitial edema. No pleural effusion or pneumothorax. Cardiomegaly. Right IJ venous catheter terminates in the lower SVC. IMPRESSION: Mild patchy left lower lobe opacity, likely atelectasis. Electronically Signed   By: Julian Hy M.D.   On: 03/16/2018 09:15     STUDIES:  5/14 TTE> Severe LVH, LVEF 55-60%,  grade 2 diastolic dysfunction, moderately decreased RV systolic funcion 8/50  head CT > no acute abnormality, advanced atrophy with small vesel ischemic disease  CULTURES:   ANTIBIOTICS:   SIGNIFICANT EVENTS:   LINES/TUBES: 5/14 ETT   DISCUSSION: 62 y/o male with a cardiac arrest on 5/14 in the setting of hyperkalemia and acute pulmonary edema.  He has HFpEF, severe LVH and ESRD.   ASSESSMENT / PLAN:  PULMONARY A: Acute respiratory failure with hypoxemia > resolved P:   HD today with volume removal  CARDIOVASCULAR A:  Severe LVH, HFpEF Hypertension baseline, normal BP here off treatment Afib, non-compliant with anticoagulation at home P:  Tele Start IV metoprolol q6h for  Afib until taking by mouth Volume removal per HD today Add ASA daily for secondary stroke prevention  RENAL A:   ESRD P:   HD per renal  GASTROINTESTINAL A:   Nausea/vomiting P:   SLP evaluation   HEMATOLOGIC A:   Anemia without bleeding P:  Monitor for bleeding  INFECTIOUS A:   No acute issues P:   Monitor for fever  ENDOCRINE A:   No acute issues P:   Monitor glucose  NEUROLOGIC A:   Some confusion post extubation, ? Some degree of anoxic injury vs toxic metabolic encephalopathy in setting of ESRD and recent cardiac arrest P:   No sedation HD now  FAMILY  - Updates: none bedside  - Inter-disciplinary family meet or Palliative Care meeting due by:  Day 7  Keep in ICU today to monitor mental status closely  Roselie Awkward, MD [facility] Pager: [phone] Cell: [phone] After 3pm or if no response, call [phone]   03/16/2018, 11:45 AM

## 2018-03-16 NOTE — Progress Notes (Signed)
eLink Physician-Brief Progress Note Patient Name: Kenneth [name] DOB: 1956-08-14 MRN: [id number]   Date of Service  03/16/2018  HPI/Events of Note  Patient having difficulty coughing up secretions.   eICU Interventions  Will order: 1. NT Suction PRN.      Intervention Category Major Interventions: Other:  Joaquina Nissen Cornelia Copa 03/16/2018, 3:09 AM

## 2018-03-17 ENCOUNTER — Inpatient Hospital Stay (HOSPITAL_COMMUNITY): Payer: Medicare Other

## 2018-03-17 ENCOUNTER — Inpatient Hospital Stay: Payer: Medicare Other | Admitting: Family Medicine

## 2018-03-17 DIAGNOSIS — I4891 Unspecified atrial fibrillation: Secondary | ICD-10-CM

## 2018-03-17 DIAGNOSIS — Z0289 Encounter for other administrative examinations: Secondary | ICD-10-CM

## 2018-03-17 LAB — BASIC METABOLIC PANEL
Anion gap: 13 (ref 5–15)
BUN: 36 mg/dL — AB (ref 6–20)
CHLORIDE: 95 mmol/L — AB (ref 101–111)
CO2: 29 mmol/L (ref 22–32)
Calcium: 8.5 mg/dL — ABNORMAL LOW (ref 8.9–10.3)
Creatinine, Ser: 7.23 mg/dL — ABNORMAL HIGH (ref 0.61–1.24)
GFR calc Af Amer: 8 mL/min — ABNORMAL LOW (ref >60)
GFR calc non Af Amer: 7 mL/min — ABNORMAL LOW (ref >60)
Glucose, Bld: 89 mg/dL (ref 65–99)
Potassium: 4.1 mmol/L (ref 3.5–5.1)
SODIUM: 137 mmol/L (ref 135–145)

## 2018-03-17 LAB — GLUCOSE, CAPILLARY
Glucose-Capillary: 85 mg/dL (ref 65–99)
Glucose-Capillary: 83 mg/dL (ref 65–99)

## 2018-03-17 MED ORDER — NEPRO/CARBSTEADY PO LIQD
1000.0000 mL | ORAL | Status: DC
  Administered 2018-03-17 – 2018-03-21 (×5): 1000 mL
  Filled 2018-03-17 (×8): qty 1000

## 2018-03-17 MED ORDER — DILTIAZEM HCL-DEXTROSE 100-5 MG/100ML-% IV SOLN (PREMIX)
5.0000 mg/h | INTRAVENOUS | Status: DC
  Administered 2018-03-17: 5 mg/h via INTRAVENOUS
  Administered 2018-03-17 – 2018-03-19 (×4): 10 mg/h via INTRAVENOUS
  Filled 2018-03-17 (×8): qty 100

## 2018-03-17 MED ORDER — LORAZEPAM 2 MG/ML IJ SOLN
1.0000 mg | Freq: Once | INTRAMUSCULAR | Status: AC
  Administered 2018-03-17: 1 mg via INTRAVENOUS
  Filled 2018-03-17: qty 1

## 2018-03-17 MED ORDER — DILTIAZEM LOAD VIA INFUSION
20.0000 mg | Freq: Once | INTRAVENOUS | Status: AC
  Administered 2018-03-17: 20 mg via INTRAVENOUS
  Filled 2018-03-17: qty 20

## 2018-03-17 MED ORDER — CHLORHEXIDINE GLUCONATE CLOTH 2 % EX PADS
6.0000 | MEDICATED_PAD | Freq: Every day | CUTANEOUS | Status: DC
  Administered 2018-03-18 – 2018-03-19 (×2): 6 via TOPICAL

## 2018-03-17 MED ORDER — METOPROLOL TARTRATE 25 MG PO TABS
25.0000 mg | ORAL_TABLET | Freq: Two times a day (BID) | ORAL | Status: DC
  Administered 2018-03-17 – 2018-03-19 (×5): 25 mg via ORAL
  Filled 2018-03-17 (×6): qty 1

## 2018-03-17 MED ORDER — HALOPERIDOL LACTATE 5 MG/ML IJ SOLN
5.0000 mg | Freq: Four times a day (QID) | INTRAMUSCULAR | Status: DC | PRN
  Administered 2018-03-17 – 2018-03-21 (×7): 5 mg via INTRAMUSCULAR
  Filled 2018-03-17 (×7): qty 1

## 2018-03-17 MED ORDER — THIAMINE HCL 100 MG/ML IJ SOLN
100.0000 mg | Freq: Every day | INTRAMUSCULAR | Status: DC
  Administered 2018-03-17 – 2018-03-21 (×5): 100 mg via INTRAVENOUS
  Filled 2018-03-17 (×5): qty 2

## 2018-03-17 MED ORDER — PRO-STAT SUGAR FREE PO LIQD
30.0000 mL | Freq: Two times a day (BID) | ORAL | Status: DC
  Administered 2018-03-17 – 2018-03-21 (×8): 30 mL
  Filled 2018-03-17 (×8): qty 30

## 2018-03-17 NOTE — Progress Notes (Signed)
PULMONARY / CRITICAL CARE MEDICINE   Name: Kenneth [name] MRN: [id number] DOB: 16-Apr-1956    ADMISSION DATE:  03/09/2018 CONSULTATION DATE:  03/09/18  REFERRING MD:  Stark Jock EDP  CHIEF COMPLAINT:  Cardiac arrest  HISTORY OF PRESENT ILLNESS:   62 y/o male with ERSD, sCHF who was admitted on 5/14 after having a cardiac arrest in the setting of a PEA arrest and wide complex tachycardia.  Had 10 minutes of CPR.   SUBJECTIVE:  More confusion overnight Afib with RVR worse Had HD yesterday  VITAL SIGNS: BP (!) 125/59 (BP Location: Right Arm)   Pulse (!) 126   Temp 99 F (37.2 C) (Oral)   Resp (!) 25   Ht 5\' 10"  (1.778 m)   Wt 87 kg (191 lb 12.8 oz)   SpO2 98%   BMI 27.52 kg/m   HEMODYNAMICS: CVP:  [0 mmHg-55 mmHg] 4 mmHg  VENTILATOR SETTINGS:    INTAKE / OUTPUT: I/O last 3 completed shifts: In: 1000 [I.V.:900; IV Piggyback:100] Out: 6812 [Other:1641]  PHYSICAL EXAMINATION:  General: lying in bed, agitated intermittently HENT: NCAT OP clear PULM: Rhonchi bilaterally, normal effort CV: RRR, no mgr GI: BS+, soft, nontender MSK: normal bulk and tone Neuro: awake, answers questions for me and follows commands but frequently tries to get out of bed     LABS:  BMET Recent Labs  Lab 03/15/18 0426 03/16/18 0359 03/17/18 0333  NA 130* 129* 137  K 5.1 4.7 4.1  CL 90* 88* 95*  CO2 26 23 29   BUN 60* 78* 36*  CREATININE 9.75* 12.43* 7.23*  GLUCOSE 111* 102* 89    Electrolytes Recent Labs  Lab 03/12/18 1719 03/13/18 0518 03/14/18 1216 03/15/18 0426 03/16/18 0359 03/17/18 0333  CALCIUM  --  8.1* 8.7* 8.6* 8.6* 8.5*  MG 1.7 1.9 2.3  --   --   --   PHOS 3.7 5.4*  --   --  7.9*  --     CBC Recent Labs  Lab 03/12/18 0328 03/13/18 0518 03/16/18 0359  WBC 12.0* 11.8* 13.7*  HGB 10.6* 9.5* 9.5*  HCT 31.2* 27.7* 27.9*  PLT 206 199 260    Coag's No results for input(s): APTT, INR in the last 168 hours.  Sepsis Markers No results for input(s):  LATICACIDVEN, PROCALCITON, O2SATVEN in the last 168 hours.  ABG Recent Labs  Lab 03/11/18 0804 03/11/18 2051  PHART 7.413 7.420  PCO2ART 43.0 45.4  PO2ART 76.0* 69.0*    Liver Enzymes Recent Labs  Lab 03/16/18 0359  ALBUMIN 2.3*    Cardiac Enzymes No results for input(s): TROPONINI, PROBNP in the last 168 hours.  Glucose Recent Labs  Lab 03/15/18 1959 03/15/18 2355 03/16/18 0350 03/16/18 0811 03/16/18 1210 03/16/18 1602  GLUCAP 105* 95 103* 101* 93 95    Imaging No results found.   STUDIES:  5/14 TTE> Severe LVH, LVEF 75-17%, grade 2 diastolic dysfunction, moderately decreased RV systolic funcion 0/01  head CT > no acute abnormality, advanced atrophy with small vesel ischemic disease  CULTURES:   ANTIBIOTICS:   SIGNIFICANT EVENTS:   LINES/TUBES: 5/14 ETT   DISCUSSION: 62 y/o male with a cardiac arrest on 5/14 in the setting of hyperkalemia and acute pulmonary edema.  He has HFpEF, severe LVH and ESRD.  Extubated on 5/20, has been confused, struggling with Afb with RVR since.  ASSESSMENT / PLAN:  PULMONARY A: Acute respiratory failure with hypoxemia > resolved Aspiration risk P:   NPO HOB > 30 degrees  CARDIOVASCULAR A:  Severe LVH, HFpEF Pulmonary hypertension, WHO2 Hypertension baseline, normal BP here off treatment Afib with RVR non-compliant with anticoagulation at home P:  Tele Start diltiazem drip for AFib, target HR < 110 ASA daily for stroke prevention Change metoprolol to 25mg  bid per home dose  RENAL A:   ESRD Hyponatremia resolved P:   HD per renal  GASTROINTESTINAL A:   Nausea/vomiting > resolved Dysphagia due to mental status P:   Insert Cortrak, start nutrition per dietician recommendations  HEMATOLOGIC A:   Anemia of ESRD without bleeding P:  Monitor for bleeding Heparin for DVT prophylaxis  INFECTIOUS A:   No acute issues P:   No acute issues  ENDOCRINE A:   No acute issues P:   Monitor  glucose  NEUROLOGIC A:   Ongoing confusion post extubation> still suspect this is toxic metabolic encephalopathy related to prior heavy EtOH abuse, ESRD, recent cardiac arrest and heavy sedation while intubated; Neuro exam non-focal P:   Minimize sedation> will give ativan x1 dose for Cortak Prn Haldol Thiamine/folate If mental status not improved by 5/30 or so would consider MRI brain and EEG but given the circumstances I suspect that testing prior to that wouldn't be too helpful  FAMILY  - Updates: none bedside  - Inter-disciplinary family meet or Palliative Care meeting due by:  Day 7  Continue to keep in ICU given delirium  Critical care time by me managing worsening delirium and cardiac arrhythmias as well as multiple databases 31 minutes  Roselie Awkward, MD [facility] Pager: [phone] Cell: [phone] After 3pm or if no response, call [phone]   03/17/2018, 9:18 AM

## 2018-03-17 NOTE — Progress Notes (Signed)
[facility] NEPHROLOGY PROGRESS NOTE  Assessment/ Plan: Pt is a 62 y.o. yo male   with hx of HTN, combined sdCHF and ESRD on chronic hemodialysis ([place] TTS), with PEA cardiac arrest, s/p cooling procedure.  Dialysis: [facility] tts 4h 69min 84kg 2/2 bath Hep 2400 LUA AVF - venofer 50 / wk - mircera 75 ug q 2wks - calcitriol 2.75 ug tiw - sensipar 90 po tiw   Assessment/Plan:  # PEA cardiac arrest s/p cooling procedure.  #Acute respiratory failure/pulmonary edema: Extubated.  Currently on oxygen via nasal cannula.   Per critical care.  # ESRD: TTS, received dialysis yesterday with of 1.6 kg.  Plan for next dialysis tomorrow.   He has left upper extremity AV fistula.  # Anemia: has high ferritin level, continue t Aranesp weekly.  Monitor CBC.  # Secondary hyperparathyroidism: Phosphorus elevated, plan to start binders when patient takes orally.  He is currently n.p.o.  # HTN/volume: Blood pressure is soft.  Not on antihypertensive.  Continue to monitor.  #Delirium/agitation: Continue supportive care.  Per primary team.  Subjective: Seen and examined at bedside.  Confused and mildly agitated.  Review of systems limited.  Objective Vital signs in last 24 hours: Vitals:   03/17/18 0700 03/17/18 0737 03/17/18 0758 03/17/18 0800  BP: (!) 177/128   (!) 125/59  Pulse: (!) 119   (!) 126  Resp: 18   (!) 25  Temp:   99 F (37.2 C)   TempSrc:   Oral   SpO2: (!) 86% 91%  98%  Weight:      Height:       Weight change: 0 kg (0 lb)  Intake/Output Summary (Last 24 hours) at 03/17/2018 1117 Last data filed at 03/17/2018 0600 Gross per 24 hour  Intake 600 ml  Output 1641 ml  Net -1041 ml       Labs: Basic Metabolic Panel: Recent Labs  Lab 03/12/18 1719 03/13/18 0518  03/15/18 0426 03/16/18 0359 03/17/18 0333  NA  --  131*   < > 130* 129* 137  K  --  4.5   < > 5.1 4.7 4.1  CL  --  91*   < > 90* 88* 95*  CO2  --  28   < > 26 23 29   GLUCOSE  --  132*   < >  111* 102* 89  BUN  --  24*   < > 60* 78* 36*  CREATININE  --  7.25*   < > 9.75* 12.43* 7.23*  CALCIUM  --  8.1*   < > 8.6* 8.6* 8.5*  PHOS 3.7 5.4*  --   --  7.9*  --    < > = values in this interval not displayed.   Liver Function Tests: Recent Labs  Lab 03/16/18 0359  ALBUMIN 2.3*   No results for input(s): LIPASE, AMYLASE in the last 168 hours. No results for input(s): AMMONIA in the last 168 hours. CBC: Recent Labs  Lab 03/11/18 0212 03/12/18 0328 03/13/18 0518 03/16/18 0359  WBC 9.3 12.0* 11.8* 13.7*  HGB 11.3* 10.6* 9.5* 9.5*  HCT 33.6* 31.2* 27.7* 27.9*  MCV 97.7 96.6 96.5 95.5  PLT 225 206 199 260   Cardiac Enzymes: No results for input(s): CKTOTAL, CKMB, CKMBINDEX, TROPONINI in the last 168 hours. CBG: Recent Labs  Lab 03/15/18 2355 03/16/18 0350 03/16/18 0811 03/16/18 1210 03/16/18 1602  GLUCAP 95 103* 101* 93 95    Iron Studies:  Recent Labs  03/16/18 0359  IRON 32*  TIBC 161*  FERRITIN 2,212*   Studies/Results: Dg Chest Port 1 View  Result Date: 03/16/2018 CLINICAL DATA:  Acute respiratory failure, extubated yesterday, history of cardiac arrest EXAM: PORTABLE CHEST 1 VIEW COMPARISON:  03/12/2018 FINDINGS: Mild patchy left lower lobe opacity, likely atelectasis. Pulmonary vascular congestion without frank interstitial edema. No pleural effusion or pneumothorax. Cardiomegaly. Right IJ venous catheter terminates in the lower SVC. IMPRESSION: Mild patchy left lower lobe opacity, likely atelectasis. Electronically Signed   By: Julian Hy M.D.   On: 03/16/2018 09:15    Medications: Infusions: . sodium chloride Stopped (03/09/18 2142)  . sodium chloride    . sodium chloride    . dextrose 25 mL/hr at 03/17/18 0600  . diltiazem (CARDIZEM) infusion 15 mg/hr (03/17/18 1000)  . famotidine (PEPCID) IV Stopped (03/16/18 2220)  . labetalol (NORMODYNE) infusion Stopped (03/12/18 0336)    Scheduled Medications: . aspirin EC  325 mg Oral Daily  .  chlorhexidine  15 mL Mouth Rinse BID  . Chlorhexidine Gluconate Cloth  6 each Topical Daily  . darbepoetin (ARANESP) injection - DIALYSIS  40 mcg Intravenous Q Tue-HD  . heparin  5,000 Units Subcutaneous Q8H  . levalbuterol  0.63 mg Nebulization TID  . mouth rinse  15 mL Mouth Rinse q12n4p  . metoprolol tartrate  25 mg Oral BID  . sodium chloride flush  10-40 mL Intracatheter Q12H  . thiamine  100 mg Intravenous Daily    have reviewed scheduled and prn medications.  Physical Exam: General: Confused male on nasal cannula, not in distress Heart: Regular rate rhythm, s1s2 nl Lungs: Coarse breath sound bilateral, no wheezing,  Abdomen:soft, Non-tender, non-distended Extremities: No lower extremity edema. Dialysis Access: Left upper extremity AV fistula has good bruit and thrill.  Dron Prasad Bhandari 03/17/2018,11:17 AM  LOS: 8 days

## 2018-03-17 NOTE — Progress Notes (Signed)
Stop tube feeds d/t O2 sats dropped into low 80's. Spoke with Dr. Pearline Cables and a portable chest x-ray was ordered. Will continue to hold tube feeds for now.

## 2018-03-17 NOTE — Progress Notes (Signed)
Nutrition Follow-up  INTERVENTION:   Nepro @ 45 ml/hr via Cortrak 30 ml Prostat BID Provides: 2144 kcal, 117 grams protein, and 785 ml free water  NUTRITION DIAGNOSIS:   Increased nutrient needs related to (dialysis ) as evidenced by estimated needs. Ongoing.   GOAL:   Patient will meet greater than or equal to 90% of their needs Progressing.   MONITOR:   TF tolerance, Labs  ASSESSMENT:   Pt with PMH of ETOH abuse, HTN, CHF, ESRD on HD TTS admitted with SOB and had a PEA arrest in ED. Pt started on 36 degree protocol.   5/20 extubated 5/21 failed swallow eval  5/22 Cortrak placed, tip post pyloric  Per MD notes pt with severe LVH and HF  Medications reviewed and include: thiamine  Labs reviewed   Diet Order:   Diet Order           Diet NPO time specified  Diet effective now          EDUCATION NEEDS:   No education needs have been identified at this time  Skin:  Skin Assessment: Reviewed RN Assessment  Last BM:  5/20 25 ml, rectal tube now removed   Height:   Ht Readings from Last 1 Encounters:  03/13/18 5\' 10"  (1.778 m)    Weight:   Wt Readings from Last 1 Encounters:  03/17/18 191 lb 12.8 oz (87 kg)    Ideal Body Weight:  78.1 kg  BMI:  Body mass index is 27.52 kg/m.  Estimated Nutritional Needs:   Kcal:  2100-2300  Protein:  106-124  Fluid:  1.2 L/day  Maylon Peppers RD, LDN, CNSC [phone] Pager [phone] After Hours Pager

## 2018-03-17 NOTE — Progress Notes (Signed)
Cortrak Tube Team Note:  Consult received to place a Cortrak feeding tube.   A 10 F Cortrak tube was placed in the left nare and secured with a nasal bridle at 95 cm. Per the Cortrak monitor reading the tube tip is post pyloric.   No x-ray is required. RN may begin using tube.   If the tube becomes dislodged please keep the tube and contact the Cortrak team at [email or web address] (password [email or web address]) for replacement.  If after hours and replacement cannot be delayed, place a NG tube and confirm placement with an abdominal x-ray.    Koleen Distance MS, RD, LDN Pager #- [phone] Office#- [phone] After Hours Pager: [phone]

## 2018-03-17 NOTE — Progress Notes (Signed)
SLP Cancellation Note  Patient Details Name: Kenneth [name] MRN: [id number] DOB: [birth year]   Cancelled treatment:        Checked on pt earlier today. He was sedated and not appropriate per RN due to agitation. Will continue efforts.    Houston Siren 03/17/2018, 5:23 PM

## 2018-03-18 LAB — RENAL FUNCTION PANEL
Albumin: 2.2 g/dL — ABNORMAL LOW (ref 3.5–5.0)
Anion gap: 13 (ref 5–15)
BUN: 69 mg/dL — ABNORMAL HIGH (ref 6–20)
CALCIUM: 8.9 mg/dL (ref 8.9–10.3)
CO2: 27 mmol/L (ref 22–32)
CREATININE: 10.92 mg/dL — AB (ref 0.61–1.24)
Chloride: 97 mmol/L — ABNORMAL LOW (ref 101–111)
GFR calc non Af Amer: 4 mL/min — ABNORMAL LOW (ref >60)
GFR, EST AFRICAN AMERICAN: 5 mL/min — AB (ref >60)
GLUCOSE: 123 mg/dL — AB (ref 65–99)
Phosphorus: 6.5 mg/dL — ABNORMAL HIGH (ref 2.5–4.6)
Potassium: 3.9 mmol/L (ref 3.5–5.1)
SODIUM: 137 mmol/L (ref 135–145)

## 2018-03-18 LAB — GLUCOSE, CAPILLARY
GLUCOSE-CAPILLARY: 123 mg/dL — AB (ref 65–99)
GLUCOSE-CAPILLARY: 120 mg/dL — AB (ref 65–99)
Glucose-Capillary: 111 mg/dL — ABNORMAL HIGH (ref 65–99)
Glucose-Capillary: 121 mg/dL — ABNORMAL HIGH (ref 65–99)
Glucose-Capillary: 89 mg/dL (ref 65–99)
Glucose-Capillary: 94 mg/dL (ref 65–99)

## 2018-03-18 LAB — FOLATE RBC
FOLATE, RBC: 1160 ng/mL (ref >498)
Folate, Hemolysate: 344.4 ng/mL
Hematocrit: 29.7 % — ABNORMAL LOW (ref 37.5–51.0)

## 2018-03-18 LAB — CBC
HCT: 26.6 % — ABNORMAL LOW (ref 39.0–52.0)
Hemoglobin: 9 g/dL — ABNORMAL LOW (ref 13.0–17.0)
MCH: 32.6 pg (ref 26.0–34.0)
MCHC: 33.8 g/dL (ref 30.0–36.0)
MCV: 96.4 fL (ref 78.0–100.0)
PLATELETS: 252 10*3/uL (ref 150–400)
RBC: 2.76 MIL/uL — ABNORMAL LOW (ref 4.22–5.81)
RDW: 13.5 % (ref 11.5–15.5)
WBC: 10.5 10*3/uL (ref 4.0–10.5)

## 2018-03-18 MED ORDER — PENTAFLUOROPROP-TETRAFLUOROETH EX AERO: 1.0000 "application " | INHALATION_SPRAY | CUTANEOUS | Status: DC | PRN

## 2018-03-18 MED ORDER — LIDOCAINE-PRILOCAINE 2.5-2.5 % EX CREA
1.0000 "application " | TOPICAL_CREAM | CUTANEOUS | Status: DC | PRN
  Filled 2018-03-18: qty 5

## 2018-03-18 MED ORDER — HEPARIN SODIUM (PORCINE) 1000 UNIT/ML DIALYSIS: 1000.0000 [IU] | INTRAMUSCULAR | Status: DC | PRN

## 2018-03-18 MED ORDER — SODIUM CHLORIDE 0.9 % IV SOLN: 100.0000 mL | INTRAVENOUS | Status: DC | PRN

## 2018-03-18 MED ORDER — LIDOCAINE HCL (PF) 1 % IJ SOLN: 5.0000 mL | INTRAMUSCULAR | Status: DC | PRN

## 2018-03-18 MED ORDER — WHITE PETROLATUM EX OINT
TOPICAL_OINTMENT | CUTANEOUS | Status: AC
Start: 2018-03-18 — End: 2018-03-19
  Filled 2018-03-18: qty 28.35

## 2018-03-18 MED ORDER — ALTEPLASE 2 MG IJ SOLR: 2.0000 mg | Freq: Once | INTRAMUSCULAR | Status: DC | PRN

## 2018-03-18 NOTE — Progress Notes (Signed)
  Speech Language Pathology Treatment: Dysphagia  Patient Details Name: Kenneth [name] MRN: [id number] DOB: [birth year] Today's Date: 03/18/2018 Time: 1962-2297 SLP Time Calculation (min) (ACUTE ONLY): 9 min  Assessment / Plan / Recommendation Clinical Impression  Pt is lethargic this morning, needing Max cues for fleeting periods of alertness, eye opening. When awake he does consistently cough to command today though. SLP provided Mod cues for increased effort with mild subjective improvement in cough. He vocalized audibly and clearly but only lasting 1-2 seconds in duration. SLP provided Min-Mod cues for awareness to accept single ice chip trial, but with no swallow elicited despite Max cueing. Pt would not orally accept additional boluses attempted. Recommend that he remain NPO with additional SLP f/u.   HPI HPI: 62 y/o male admitted on 5/14 after having a cardiac arrest in the setting of a PEA arrest and wide complex tachycardia.  Had 10 minutes of CPR. ETT 5/14-5/20. CT Head showed no acute changes but advanced cerebral and cerebellar atrophy, remote bilateral lacunar infarcts involving the basal ganglia. PMH:  ERSD, sCHF, EtOH abuse, HTN. Pt had a BSE in July 2017 recommending Dys 3 diet and thin liquids by cup due to cognitively-based dysphagia. He was advanced to regular textures and thin liquids by cup or straw prior to d/c.      SLP Plan  Continue with current plan of care       Recommendations  Diet recommendations: NPO Medication Administration: Via alternative means                Oral Care Recommendations: Oral care QID Follow up Recommendations: (tba) SLP Visit Diagnosis: Dysphagia, unspecified (R13.10) Plan: Continue with current plan of care       GO                Kenneth [name] 03/18/2018, 9:18 AM  Kenneth [name], M.A. CCC-SLP [phone]

## 2018-03-18 NOTE — Progress Notes (Signed)
Dialysis treatment completed.  3000 mL ultrafiltrated and net fluid removal 2500 mL.    Patient status unchanged. Lung sounds diminished and coarse to ausculation in all fields. Generalized non pitting edema. Cardiac: Afib.  Disconnected lines and removed needles.  Pressure held for 10 minutes and band aid/gauze dressing applied.  Report given to bedside RN, Josh.

## 2018-03-18 NOTE — Progress Notes (Signed)
[facility] NEPHROLOGY PROGRESS NOTE  Assessment/ Plan: Pt is a 62 y.o. yo male   with hx of HTN, combined sdCHF and ESRD on chronic hemodialysis ([place] TTS), with PEA cardiac arrest, s/p cooling procedure.  Dialysis: [facility] tts 4h 52min 84kg 2/2 bath Hep 2400 LUA AVF - venofer 50 / wk - mircera 75 ug q 2wks - calcitriol 2.75 ug tiw - sensipar 90 po tiw   Assessment/Plan:  # PEA cardiac arrest s/p cooling procedure.  #Acute respiratory failure/pulmonary edema: Extubated.  Currently on oxygen via nasal cannula.   Per critical care.  # ESRD: TTS, plan for dialysis today with 2K bath.  2 to 3 kg UF as tolerated.     He has left upper extremity AV fistula.  # Anemia: has high ferritin level, continue  Aranesp weekly.  Monitor CBC.  # Secondary hyperparathyroidism: Phosphorus elevated, plan to start binders when patient takes orally.  He is currently n.p.o.  # HTN/volume: Blood pressure is soft.  Not on antihypertensive.  Continue to monitor.  #Delirium/agitation: Continue supportive care.  Per primary team.  No clinical improvement.  Subjective: Seen and examined at bedside.  Still having confusion.  Review of systems limited.  Objective Vital signs in last 24 hours: Vitals:   03/18/18 0611 03/18/18 0700 03/18/18 0810 03/18/18 0824  BP: 140/73 (!) 152/79    Pulse: 71 88    Resp:  18    Temp:   98.5 F (36.9 C)   TempSrc:   Oral   SpO2: (!) 80% 97%  100%  Weight:      Height:       Weight change: -2.4 kg (-5 lb 4.7 oz)  Intake/Output Summary (Last 24 hours) at 03/18/2018 0937 Last data filed at 03/18/2018 0900 Gross per 24 hour  Intake 1120.75 ml  Output 1 ml  Net 1119.75 ml       Labs: Basic Metabolic Panel: Recent Labs  Lab 03/12/18 1719 03/13/18 0518  03/15/18 0426 03/16/18 0359 03/17/18 0333  NA  --  131*   < > 130* 129* 137  K  --  4.5   < > 5.1 4.7 4.1  CL  --  91*   < > 90* 88* 95*  CO2  --  28   < > 26 23 29   GLUCOSE  --  132*   <  > 111* 102* 89  BUN  --  24*   < > 60* 78* 36*  CREATININE  --  7.25*   < > 9.75* 12.43* 7.23*  CALCIUM  --  8.1*   < > 8.6* 8.6* 8.5*  PHOS 3.7 5.4*  --   --  7.9*  --    < > = values in this interval not displayed.   Liver Function Tests: Recent Labs  Lab 03/16/18 0359  ALBUMIN 2.3*   No results for input(s): LIPASE, AMYLASE in the last 168 hours. No results for input(s): AMMONIA in the last 168 hours. CBC: Recent Labs  Lab 03/12/18 0328 03/13/18 0518 03/16/18 0359  WBC 12.0* 11.8* 13.7*  HGB 10.6* 9.5* 9.5*  HCT 31.2* 27.7* 27.9*  MCV 96.6 96.5 95.5  PLT 206 199 260   Cardiac Enzymes: No results for input(s): CKTOTAL, CKMB, CKMBINDEX, TROPONINI in the last 168 hours. CBG: Recent Labs  Lab 03/17/18 0401 03/17/18 1954 03/17/18 2343 03/18/18 0318 03/18/18 0801  GLUCAP 85 83 94 123* 89    Iron Studies:  Recent Labs    03/16/18  0359  IRON 32*  TIBC 161*  FERRITIN 2,212*   Studies/Results: Dg Chest Port 1 View  Result Date: 03/17/2018 CLINICAL DATA:  Decrease in oxygen saturation with initiation of tube feeds. EXAM: PORTABLE CHEST 1 VIEW COMPARISON:  03/16/2018 FINDINGS: Stable cardiac enlargement. Stable positioning of central line. Feeding tube is been placed which extends below the diaphragm. Lungs show improved aeration at the left base. No overt airspace infiltrate or pulmonary edema. No pleural effusions or pneumothorax. IMPRESSION: Improved aeration at the left lung base.  No acute findings. Electronically Signed   By: Aletta Edouard M.D.   On: 03/17/2018 20:04   Dg Abd Portable 1v  Result Date: 03/17/2018 CLINICAL DATA:  Feeding tube placement. EXAM: PORTABLE ABDOMEN - 1 VIEW COMPARISON:  None. FINDINGS: Long feeding tube present extending through the stomach and duodenal C loop with the tip just beyond the ligament of Treitz in the proximal jejunum. Underlying bowel gas pattern is unremarkable. IMPRESSION: Feeding tube tip extends beyond the ligament of  Treitz into the proximal jejunum. Electronically Signed   By: Aletta Edouard M.D.   On: 03/17/2018 20:02    Medications: Infusions: . sodium chloride Stopped (03/09/18 2142)  . sodium chloride    . sodium chloride    . dextrose 25 mL/hr (03/18/18 0814)  . diltiazem (CARDIZEM) infusion 10 mg/hr (03/18/18 0600)  . famotidine (PEPCID) IV Stopped (03/18/18 0005)  . feeding supplement (NEPRO CARB STEADY) Stopped (03/18/18 0600)  . labetalol (NORMODYNE) infusion Stopped (03/12/18 0336)    Scheduled Medications: . aspirin EC  325 mg Oral Daily  . chlorhexidine  15 mL Mouth Rinse BID  . Chlorhexidine Gluconate Cloth  6 each Topical Daily  . Chlorhexidine Gluconate Cloth  6 each Topical Q0600  . darbepoetin (ARANESP) injection - DIALYSIS  40 mcg Intravenous Q Tue-HD  . feeding supplement (PRO-STAT SUGAR FREE 64)  30 mL Per Tube BID  . heparin  5,000 Units Subcutaneous Q8H  . levalbuterol  0.63 mg Nebulization TID  . mouth rinse  15 mL Mouth Rinse q12n4p  . metoprolol tartrate  25 mg Oral BID  . sodium chloride flush  10-40 mL Intracatheter Q12H  . thiamine  100 mg Intravenous Daily    have reviewed scheduled and prn medications.  Physical Exam: General: Confused male lying in bed, not in distress Heart: Regular rate rhythm S1-S2 normal Lungs: Bibasilar decreased breath sound, no wheezing Abdomen:soft, Non-tender, non-distended Extremities: No lower extremity edema. Dialysis Access: Left upper extremity AV fistula has good bruit and thrill.  Denzell Colasanti Prasad Uldine Fuster 03/18/2018,9:37 AM  LOS: 9 days

## 2018-03-18 NOTE — Progress Notes (Signed)
PULMONARY / CRITICAL CARE MEDICINE   Name: Kenneth [name] MRN: [id number] DOB: 27-Apr-1956    ADMISSION DATE:  03/09/2018 CONSULTATION DATE:  03/09/18  REFERRING MD:  Stark Jock EDP  CHIEF COMPLAINT:  Cardiac arrest  HISTORY OF PRESENT ILLNESS:   62 y/o male with ERSD, sCHF who was admitted on 5/14 after having a cardiac arrest in the setting of a PEA arrest and wide complex tachycardia.  Had 10 minutes of CPR.   SUBJECTIVE:  Lethargic 5/23  VITAL SIGNS: BP (!) 152/79   Pulse 88   Temp 98.5 F (36.9 C) (Oral)   Resp 18   Ht 5\' 10"  (1.778 m)   Wt 85.6 kg (188 lb 11.4 oz)   SpO2 100%   BMI 27.08 kg/m   HEMODYNAMICS: CVP:  [15 mmHg-51 mmHg] 29 mmHg  VENTILATOR SETTINGS:    INTAKE / OUTPUT: I/O last 3 completed shifts: In: 1433.1 [I.V.:1108.1; NG/GT:225; IV Piggyback:100] Out: 1 [Stool:1]  PHYSICAL EXAMINATION:  General: ill appearing man, lethargic HENT: some UA noise, secretions.  PULM: bilateral coarse BS, UA secretion noise CV: irregular, no M GI: soft, non-tender, + BS MSK: no deformities  Neuro: opened eyes to voice and attempts to talk but unintelligible, coughed on command, profoundly weak   LABS:  BMET Recent Labs  Lab 03/15/18 0426 03/16/18 0359 03/17/18 0333  NA 130* 129* 137  K 5.1 4.7 4.1  CL 90* 88* 95*  CO2 26 23 29   BUN 60* 78* 36*  CREATININE 9.75* 12.43* 7.23*  GLUCOSE 111* 102* 89    Electrolytes Recent Labs  Lab 03/12/18 1719 03/13/18 0518 03/14/18 1216 03/15/18 0426 03/16/18 0359 03/17/18 0333  CALCIUM  --  8.1* 8.7* 8.6* 8.6* 8.5*  MG 1.7 1.9 2.3  --   --   --   PHOS 3.7 5.4*  --   --  7.9*  --     CBC Recent Labs  Lab 03/12/18 0328 03/13/18 0518 03/16/18 0359  WBC 12.0* 11.8* 13.7*  HGB 10.6* 9.5* 9.5*  HCT 31.2* 27.7* 27.9*  PLT 206 199 260    Coag's No results for input(s): APTT, INR in the last 168 hours.  Sepsis Markers No results for input(s): LATICACIDVEN, PROCALCITON, O2SATVEN in the last 168  hours.  ABG Recent Labs  Lab 03/11/18 2051  PHART 7.420  PCO2ART 45.4  PO2ART 69.0*    Liver Enzymes Recent Labs  Lab 03/16/18 0359  ALBUMIN 2.3*    Cardiac Enzymes No results for input(s): TROPONINI, PROBNP in the last 168 hours.  Glucose Recent Labs  Lab 03/16/18 1602 03/17/18 0401 03/17/18 1954 03/17/18 2343 03/18/18 0318 03/18/18 0801  GLUCAP 95 85 83 94 123* 89    Imaging Dg Chest Port 1 View  Result Date: 03/17/2018 CLINICAL DATA:  Decrease in oxygen saturation with initiation of tube feeds. EXAM: PORTABLE CHEST 1 VIEW COMPARISON:  03/16/2018 FINDINGS: Stable cardiac enlargement. Stable positioning of central line. Feeding tube is been placed which extends below the diaphragm. Lungs show improved aeration at the left base. No overt airspace infiltrate or pulmonary edema. No pleural effusions or pneumothorax. IMPRESSION: Improved aeration at the left lung base.  No acute findings. Electronically Signed   By: Aletta Edouard M.D.   On: 03/17/2018 20:04   Dg Abd Portable 1v  Result Date: 03/17/2018 CLINICAL DATA:  Feeding tube placement. EXAM: PORTABLE ABDOMEN - 1 VIEW COMPARISON:  None. FINDINGS: Long feeding tube present extending through the stomach and duodenal C loop with the tip just  beyond the ligament of Treitz in the proximal jejunum. Underlying bowel gas pattern is unremarkable. IMPRESSION: Feeding tube tip extends beyond the ligament of Treitz into the proximal jejunum. Electronically Signed   By: Aletta Edouard M.D.   On: 03/17/2018 20:02     STUDIES:  5/14 TTE> Severe LVH, LVEF 17-00%, grade 2 diastolic dysfunction, moderately decreased RV systolic funcion 1/74  head CT > no acute abnormality, advanced atrophy with small vesel ischemic disease  CULTURES:   ANTIBIOTICS:   SIGNIFICANT EVENTS:   LINES/TUBES: 5/14 ETT >> 5/20  DISCUSSION: 62 y/o male with a cardiac arrest on 5/14 in the setting of hyperkalemia and acute pulmonary edema.  He  has HFpEF, severe LVH and ESRD.  Extubated on 5/20, has been confused, struggling with Afb with RVR since.  ASSESSMENT / PLAN:  PULMONARY A: Acute respiratory failure with hypoxemia > resolved Aspiration risk due to poor airway protection P:   Continue NPO Push pulm hygiene May need some prn NTS depending on his ability to manage his secretions.  HOB > 30 degrees  CARDIOVASCULAR A:  Severe LVH, HFpEF Pulmonary hypertension, WHO2 Hypertension baseline, normal BP here off treatment Afib with RVR non-compliant with anticoagulation at home P:  Continue dilt gtt, wean as able for goal HR < 110 ASA as ordered Continue metoprolol 25 bid  RENAL A:   ESRD Hyponatremia resolved P:   Current HD schedule TThS  GASTROINTESTINAL A:   Nausea/vomiting > resolved Dysphagia due to mental status P:   NPO NGT in place, TF running  HEMATOLOGIC A:   Anemia of ESRD without bleeding P:  Monitor for bleeding Continue current heparin   INFECTIOUS A:   No acute issues P:   Following clinically  ENDOCRINE A:   Hypoglycemia  P:   Following CBG, currently on D10 25cc/h, hopefully wean to off as he tolerates TF  NEUROLOGIC A:   Ongoing confusion post extubation> still suspect this is toxic metabolic encephalopathy related to prior heavy EtOH abuse, ESRD, recent cardiac arrest and heavy sedation while intubated; Neuro exam non-focal P:   Minimize sedation, has haldol ordered currently Thiamine and folate as ordered Continue to re-orient, hold sedation. If fails to clear over next several days then will pursue brain imaging.   FAMILY  - Updates: none bedside  - Inter-disciplinary family meet or Palliative Care meeting due by: 5/25   Independent CC time 33 minutes  Baltazar Apo, MD, PhD 03/18/2018, 11:28 AM [facility] [phone] or if no answer [phone]

## 2018-03-18 NOTE — Progress Notes (Signed)
Arrived to patient room 2H-08 at 1650.  Reviewed treatment plan and this RN agrees.  Report received from bedside RN, Lesleigh Noe.  Consent verified.  Patient Alert and oriented to self only. Lung sounds diminished and coarse to ausculation in all fields. Generalized non pitting edema. Cardiac: Afib, rate controlled.  Prepped LUAVF with alcohol and cannulated with two 15 gauge needles.  Pulsation of blood noted.  Flushed access well with saline per protocol.  Connected and secured lines and initiated tx at 1710.  UF goal of 2500 mL and net fluid removal of 2000 mL.  Will continue to monitor.

## 2018-03-18 NOTE — Plan of Care (Signed)
Pt experienced 2 bowel movements during night shift. Pt continues to be in A-fib but HR constantly below 100bpm. Patient BP WDL. Pt being turned q2 in order to prevent skin breakdown. Pt intermittently alert to self/location/time but continues to be disoriented to situation.

## 2018-03-19 LAB — BASIC METABOLIC PANEL
ANION GAP: 12 (ref 5–15)
BUN: 33 mg/dL — AB (ref 6–20)
CO2: 32 mmol/L (ref 22–32)
Calcium: 8.5 mg/dL — ABNORMAL LOW (ref 8.9–10.3)
Chloride: 93 mmol/L — ABNORMAL LOW (ref 101–111)
Creatinine, Ser: 6.35 mg/dL — ABNORMAL HIGH (ref 0.61–1.24)
GFR calc Af Amer: 10 mL/min — ABNORMAL LOW (ref >60)
GFR calc non Af Amer: 8 mL/min — ABNORMAL LOW (ref >60)
Glucose, Bld: 112 mg/dL — ABNORMAL HIGH (ref 65–99)
POTASSIUM: 3.3 mmol/L — AB (ref 3.5–5.1)
SODIUM: 137 mmol/L (ref 135–145)

## 2018-03-19 LAB — GLUCOSE, CAPILLARY
GLUCOSE-CAPILLARY: 127 mg/dL — AB (ref 65–99)
GLUCOSE-CAPILLARY: 114 mg/dL — AB (ref 65–99)
GLUCOSE-CAPILLARY: 103 mg/dL — AB (ref 65–99)
Glucose-Capillary: 97 mg/dL (ref 65–99)
Glucose-Capillary: 114 mg/dL — ABNORMAL HIGH (ref 65–99)

## 2018-03-19 LAB — MAGNESIUM: MAGNESIUM: 2.1 mg/dL (ref 1.7–2.4)

## 2018-03-19 MED ORDER — CHLORHEXIDINE GLUCONATE CLOTH 2 % EX PADS
6.0000 | MEDICATED_PAD | Freq: Every day | CUTANEOUS | Status: DC
  Administered 2018-03-20: 6 via TOPICAL

## 2018-03-19 MED ORDER — DILTIAZEM 12 MG/ML ORAL SUSPENSION
60.0000 mg | Freq: Four times a day (QID) | ORAL | Status: DC
  Administered 2018-03-19 – 2018-03-20 (×4): 60 mg
  Filled 2018-03-19 (×6): qty 6

## 2018-03-19 MED ORDER — ASPIRIN 325 MG PO TABS
325.0000 mg | ORAL_TABLET | Freq: Every day | ORAL | Status: DC
  Administered 2018-03-19: 325 mg via ORAL
  Filled 2018-03-19 (×2): qty 1

## 2018-03-19 MED ORDER — VALPROIC ACID 250 MG/5ML PO SOLN
500.0000 mg | Freq: Two times a day (BID) | ORAL | Status: DC
  Administered 2018-03-19: 250 mg
  Administered 2018-03-19 – 2018-03-21 (×4): 500 mg
  Filled 2018-03-19 (×2): qty 10
  Filled 2018-03-19: qty 5
  Filled 2018-03-19 (×6): qty 10

## 2018-03-19 NOTE — Progress Notes (Signed)
PULMONARY / CRITICAL CARE MEDICINE   Name: Kenneth [name] MRN: [id number] DOB: 1955-12-02    ADMISSION DATE:  03/09/2018 CONSULTATION DATE:  03/09/18  REFERRING MD:  Stark Jock EDP  CHIEF COMPLAINT:  Cardiac arrest  HISTORY OF PRESENT ILLNESS:   62 y/o male with ERSD, sCHF who was admitted on 5/14 after having a cardiac arrest in the setting of a PEA arrest and wide complex tachycardia.  Had 10 minutes of CPR.   SUBJECTIVE:  More awake today, able to carry on a conversation He required Haldol x3 over the last 24 hours   VITAL SIGNS: BP (!) 139/59   Pulse (!) 112   Temp 99 F (37.2 C) (Axillary)   Resp (!) 30   Ht 5\' 10"  (1.778 m)   Wt 84.8 kg (186 lb 15.2 oz)   SpO2 90%   BMI 26.82 kg/m   HEMODYNAMICS:    VENTILATOR SETTINGS:    INTAKE / OUTPUT: I/O last 3 completed shifts: In: 2055 [I.V.:1015; NG/GT:990; IV Piggyback:50] Out: 2501 [Other:2500; Stool:1]  PHYSICAL EXAMINATION:  General: Ill-appearing man, lying in bed HENT: Upper airway noise and secretions have resolved, NG tube in place PULM: Few bilateral coarse breath sounds, improved compared with 5/23 CV: Irregular, no murmur, heart rate 110 GI: Soft, nontender, positive bowel sounds MSK: No deformities Neuro: Wakes easily to voice, a bit slow to respond but all of his responses are appropriate, follows commands, good strength  LABS:  BMET Recent Labs  Lab 03/17/18 0333 03/18/18 1100 03/19/18 0405  NA 137 137 137  K 4.1 3.9 3.3*  CL 95* 97* 93*  CO2 29 27 32  BUN 36* 69* 33*  CREATININE 7.23* 10.92* 6.35*  GLUCOSE 89 123* 112*    Electrolytes Recent Labs  Lab 03/13/18 0518 03/14/18 1216  03/16/18 0359 03/17/18 0333 03/18/18 1100 03/19/18 0405  CALCIUM 8.1* 8.7*   < > 8.6* 8.5* 8.9 8.5*  MG 1.9 2.3  --   --   --   --  2.1  PHOS 5.4*  --   --  7.9*  --  6.5*  --    < > = values in this interval not displayed.    CBC Recent Labs  Lab 03/13/18 0518 03/16/18 0359 03/17/18 1311  03/18/18 1100  WBC 11.8* 13.7*  --  10.5  HGB 9.5* 9.5*  --  9.0*  HCT 27.7* 27.9* 29.7* 26.6*  PLT 199 260  --  252    Coag's No results for input(s): APTT, INR in the last 168 hours.  Sepsis Markers No results for input(s): LATICACIDVEN, PROCALCITON, O2SATVEN in the last 168 hours.  ABG No results for input(s): PHART, PCO2ART, PO2ART in the last 168 hours.  Liver Enzymes Recent Labs  Lab 03/16/18 0359 03/18/18 1100  ALBUMIN 2.3* 2.2*    Cardiac Enzymes No results for input(s): TROPONINI, PROBNP in the last 168 hours.  Glucose Recent Labs  Lab 03/18/18 0801 03/18/18 1200 03/18/18 1605 03/18/18 2336 03/19/18 0424 03/19/18 0807  GLUCAP 89 120* 121* 111* 97 127*    Imaging No results found.   STUDIES:  5/14 TTE> Severe LVH, LVEF 56-97%, grade 2 diastolic dysfunction, moderately decreased RV systolic funcion 9/48  head CT > no acute abnormality, advanced atrophy with small vesel ischemic disease  CULTURES:   ANTIBIOTICS:   SIGNIFICANT EVENTS:   LINES/TUBES: 5/14 ETT >> 5/20  DISCUSSION: 62 y/o male with a cardiac arrest on 5/14 in the setting of hyperkalemia and acute pulmonary edema.  He has HFpEF, severe LVH and ESRD.  Extubated on 5/20, has been confused but improving, struggling with Afb with RVR since.  ASSESSMENT / PLAN:  PULMONARY A: Acute respiratory failure with hypoxemia > resolved Aspiration risk due to poor airway protection P:   Continue NPO, try to repeat swallowing evaluation in coming days as mental status improves Push pulmonary hygiene Aspiration precautions  CARDIOVASCULAR A:  Severe LVH, HFpEF Pulmonary hypertension, WHO2 Hypertension baseline, normal BP here off treatment Afib with RVR non-compliant with anticoagulation at home P:  Add diltiazem 60 mg per tube q. 6, wean built drip to off, goal heart rate 110 Aspirin as ordered Continue metoprolol 25 twice daily   RENAL A:   ESRD Hyponatremia resolved P:    Continue current HD schedule, Tuesday Thursday Saturday  GASTROINTESTINAL A:   Nausea/vomiting > resolved Dysphagia due to mental status P:   NPO NG tube in place, tolerating tube feeding  HEMATOLOGIC A:   Anemia of ESRD without bleeding P:  No current evidence of bleeding, continue heparin Follow CBC intermittently  INFECTIOUS A:   No acute issues P:   Following clinically off of antibiotics  ENDOCRINE A:   Hypoglycemia  P:    Following CBG, currently on D10 25cc/h, hopefully wean to off as he tolerates TF  NEUROLOGIC A:   Ongoing confusion post extubation> still suspect this is toxic metabolic encephalopathy related to prior heavy EtOH abuse, ESRD, recent cardiac arrest and heavy sedation while intubated; Neuro exam non-focal P:   Improved this morning but he has required Haldol intermittently. Add Depakote scheduled, consider risperidone as we go forward depending on degree of agitation  Recheck ECG Continue thiamine, folate through the weekend, can DC next week Frequent reorientation, careful with sedating medications.  No indication for brain imaging at this time.   FAMILY  - Updates: none bedside  - Inter-disciplinary family meet or Palliative Care meeting due by: 5/25   Independent CC time 31 minutes  Baltazar Apo, MD, PhD 03/19/2018, 10:14 AM [facility] [phone] or if no answer [phone]

## 2018-03-19 NOTE — Plan of Care (Signed)
Pt continues to receive nutrition via cortrak. Pt continues to be on Diltiazem gtt to help achieve HR less than 115bpm. Pt BP WDL. Pt intermittently alert to self/place/time but continues to be restless/agitated even w/ PRN haldol. Pt being turned q2 in order to prevent skin breakdown

## 2018-03-19 NOTE — Progress Notes (Signed)
[facility] NEPHROLOGY PROGRESS [name]  Assessment/ Plan: Pt is a 62 y.o. yo male   with hx of HTN, combined sdCHF and ESRD on chronic hemodialysis ([place] [facility]), with PEA cardiac arrest, s/p cooling procedure.  Dialysis: [facility] tts 4h 53min 84kg 2/2 bath Hep 2400 LUA AVF - venofer 50 / wk - mircera 75 ug q 2wks - calcitriol 2.75 ug tiw - sensipar 90 po tiw   Assessment/Plan:  # PEA cardiac arrest s/p cooling procedure.  #Acute respiratory failure/pulmonary edema: Extubated.  Currently on oxygen via nasal cannula.   Per critical care.  # ESRD: TTS, tolerated dialysis yesterday with UF of 2.5 kg.  Plan for next dialysis tomorrow.  Serum potassium level of 3.3 noted.   He has left upper extremity AV fistula.  # Anemia: has high ferritin level, continue  Aranesp weekly.  Monitor CBC.  # Secondary hyperparathyroidism: Phosphorus elevated, plan to start binders when patient takes orally.  He is currently n.p.o. continue to monitor.  # HTN/volume: On metoprolol.  Volume status okay.  Monitor blood pressure.  #Delirium/agitation: Continue supportive care.  Per primary team.  Improvement.  Subjective: Seen and examined at bedside.  Still having confusion episode.  Review of systems limited.  He was alert awake and oriented to hospital. Objective Vital signs in last 24 hours: Vitals:   03/19/18 0700 03/19/18 0730 03/19/18 0800 03/19/18 0804  BP:  (!) 145/72 (!) 139/59   Pulse: (!) 114 (!) 127 (!) 112   Resp: (!) 25 (!) 22 (!) 30   Temp:    99 F (37.2 C)  TempSrc:    Axillary  SpO2: 90% (!) 88% 90%   Weight:      Height:       Weight change: 0 kg (0 lb)  Intake/Output Summary (Last 24 hours) at 03/19/2018 0954 Last data filed at 03/19/2018 0500 Gross per 24 hour  Intake 1120 ml  Output 2500 ml  Net -1380 ml       Labs: Basic Metabolic Panel: Recent Labs  Lab 03/13/18 0518  03/16/18 0359 03/17/18 0333 03/18/18 1100 03/19/18 0405  NA 131*   < > 129*  137 137 137  K 4.5   < > 4.7 4.1 3.9 3.3*  CL 91*   < > 88* 95* 97* 93*  CO2 28   < > 23 29 27  32  GLUCOSE 132*   < > 102* 89 123* 112*  BUN 24*   < > 78* 36* 69* 33*  CREATININE 7.25*   < > 12.43* 7.23* 10.92* 6.35*  CALCIUM 8.1*   < > 8.6* 8.5* 8.9 8.5*  PHOS 5.4*  --  7.9*  --  6.5*  --    < > = values in this interval not displayed.   Liver Function Tests: Recent Labs  Lab 03/16/18 0359 03/18/18 1100  ALBUMIN 2.3* 2.2*   No results for input(s): LIPASE, AMYLASE in the last 168 hours. No results for input(s): AMMONIA in the last 168 hours. CBC: Recent Labs  Lab 03/13/18 0518 03/16/18 0359 03/17/18 1311 03/18/18 1100  WBC 11.8* 13.7*  --  10.5  HGB 9.5* 9.5*  --  9.0*  HCT 27.7* 27.9* 29.7* 26.6*  MCV 96.5 95.5  --  96.4  PLT 199 260  --  252   Cardiac Enzymes: No results for input(s): CKTOTAL, CKMB, CKMBINDEX, TROPONINI in the last 168 hours. CBG: Recent Labs  Lab 03/18/18 1200 03/18/18 1605 03/18/18 2336 03/19/18 0424 03/19/18 3846  GLUCAP 120* 121* 111* 97 127*    Iron Studies:  No results for input(s): IRON, TIBC, TRANSFERRIN, FERRITIN in the last 72 hours. Studies/Results: Dg Chest Port 1 View  Result Date: 03/17/2018 CLINICAL DATA:  Decrease in oxygen saturation with initiation of tube feeds. EXAM: PORTABLE CHEST 1 VIEW COMPARISON:  03/16/2018 FINDINGS: Stable cardiac enlargement. Stable positioning of central line. Feeding tube is been placed which extends below the diaphragm. Lungs show improved aeration at the left base. No overt airspace infiltrate or pulmonary edema. No pleural effusions or pneumothorax. IMPRESSION: Improved aeration at the left lung base.  No acute findings. Electronically Signed   By: Aletta Edouard M.D.   On: 03/17/2018 20:04   Dg Abd Portable 1v  Result Date: 03/17/2018 CLINICAL DATA:  Feeding tube placement. EXAM: PORTABLE ABDOMEN - 1 VIEW COMPARISON:  None. FINDINGS: Long feeding tube present extending through the stomach and  duodenal C loop with the tip just beyond the ligament of Treitz in the proximal jejunum. Underlying bowel gas pattern is unremarkable. IMPRESSION: Feeding tube tip extends beyond the ligament of Treitz into the proximal jejunum. Electronically Signed   By: Aletta Edouard M.D.   On: 03/17/2018 20:02    Medications: Infusions: . sodium chloride Stopped (03/09/18 2142)  . sodium chloride    . sodium chloride    . sodium chloride    . sodium chloride    . dextrose 25 mL/hr at 03/19/18 0500  . diltiazem (CARDIZEM) infusion 15 mg/hr (03/19/18 0800)  . famotidine (PEPCID) IV Stopped (03/18/18 2255)  . feeding supplement (NEPRO CARB STEADY) 45 mL/hr at 03/19/18 0500  . labetalol (NORMODYNE) infusion Stopped (03/12/18 0336)    Scheduled Medications: . aspirin EC  325 mg Oral Daily  . chlorhexidine  15 mL Mouth Rinse BID  . Chlorhexidine Gluconate Cloth  6 each Topical Daily  . Chlorhexidine Gluconate Cloth  6 each Topical Q0600  . darbepoetin (ARANESP) injection - DIALYSIS  40 mcg Intravenous Q Tue-HD  . feeding supplement (PRO-STAT SUGAR FREE 64)  30 mL Per Tube BID  . heparin  5,000 Units Subcutaneous Q8H  . levalbuterol  0.63 mg Nebulization TID  . mouth rinse  15 mL Mouth Rinse q12n4p  . metoprolol tartrate  25 mg Oral BID  . sodium chloride flush  10-40 mL Intracatheter Q12H  . thiamine  100 mg Intravenous Daily    have reviewed scheduled and prn medications.  Physical Exam: General: Confused male lying in bed, not in distress Heart: Regular rate rhythm S1-S2 normal Lungs: Bibasilar decreased breath sound, no wheezing Abdomen:soft, Non-tender, non-distended Extremities: No lower extremity edema. Dialysis Access: Left upper extremity AV fistula has good bruit and thrill.  Dron Prasad Bhandari 03/19/2018,9:54 AM  LOS: 10 days

## 2018-03-20 LAB — BASIC METABOLIC PANEL
Anion gap: 14 (ref 5–15)
BUN: 60 mg/dL — ABNORMAL HIGH (ref 6–20)
CHLORIDE: 91 mmol/L — AB (ref 101–111)
CO2: 29 mmol/L (ref 22–32)
CREATININE: 8.72 mg/dL — AB (ref 0.61–1.24)
Calcium: 8.9 mg/dL (ref 8.9–10.3)
GFR, EST AFRICAN AMERICAN: 7 mL/min — AB (ref >60)
GFR, EST NON AFRICAN AMERICAN: 6 mL/min — AB (ref >60)
Glucose, Bld: 119 mg/dL — ABNORMAL HIGH (ref 65–99)
Potassium: 3.4 mmol/L — ABNORMAL LOW (ref 3.5–5.1)
SODIUM: 134 mmol/L — AB (ref 135–145)

## 2018-03-20 LAB — GLUCOSE, CAPILLARY
GLUCOSE-CAPILLARY: 103 mg/dL — AB (ref 65–99)
GLUCOSE-CAPILLARY: 90 mg/dL (ref 65–99)
Glucose-Capillary: 91 mg/dL (ref 65–99)
Glucose-Capillary: 91 mg/dL (ref 65–99)
Glucose-Capillary: 97 mg/dL (ref 65–99)
Glucose-Capillary: 98 mg/dL (ref 65–99)

## 2018-03-20 LAB — CBC
HCT: 27.5 % — ABNORMAL LOW (ref 39.0–52.0)
Hemoglobin: 9.3 g/dL — ABNORMAL LOW (ref 13.0–17.0)
MCH: 33.2 pg (ref 26.0–34.0)
MCHC: 33.8 g/dL (ref 30.0–36.0)
MCV: 98.2 fL (ref 78.0–100.0)
PLATELETS: 283 10*3/uL (ref 150–400)
RBC: 2.8 MIL/uL — AB (ref 4.22–5.81)
RDW: 13.4 % (ref 11.5–15.5)
WBC: 14.1 10*3/uL — AB (ref 4.0–10.5)

## 2018-03-20 MED ORDER — CHLORHEXIDINE GLUCONATE CLOTH 2 % EX PADS: 6.0000 | MEDICATED_PAD | Freq: Every day | CUTANEOUS | Status: DC

## 2018-03-20 MED ORDER — ASPIRIN 325 MG PO TABS
325.0000 mg | ORAL_TABLET | Freq: Every day | ORAL | Status: DC
  Administered 2018-03-20 – 2018-03-21 (×2): 325 mg
  Filled 2018-03-20 (×2): qty 1

## 2018-03-20 MED ORDER — HEPARIN SODIUM (PORCINE) 1000 UNIT/ML DIALYSIS: 1000.0000 [IU] | INTRAMUSCULAR | Status: DC | PRN

## 2018-03-20 MED ORDER — HEPARIN SODIUM (PORCINE) 1000 UNIT/ML DIALYSIS
1000.0000 [IU] | INTRAMUSCULAR | Status: DC | PRN
  Filled 2018-03-20: qty 1

## 2018-03-20 MED ORDER — LACTULOSE 10 GM/15ML PO SOLN: 30.0000 g | Freq: Every day | ORAL | Status: DC | PRN

## 2018-03-20 MED ORDER — DILTIAZEM 12 MG/ML ORAL SUSPENSION
60.0000 mg | Freq: Four times a day (QID) | ORAL | Status: DC
  Administered 2018-03-20 – 2018-03-21 (×5): 60 mg
  Filled 2018-03-20 (×11): qty 6

## 2018-03-20 MED ORDER — SODIUM CHLORIDE 0.9 % IV SOLN: 100.0000 mL | INTRAVENOUS | Status: DC | PRN

## 2018-03-20 MED ORDER — LIDOCAINE-PRILOCAINE 2.5-2.5 % EX CREA: 1.0000 "application " | TOPICAL_CREAM | CUTANEOUS | Status: DC | PRN

## 2018-03-20 MED ORDER — FENTANYL CITRATE (PF) 100 MCG/2ML IJ SOLN
12.5000 ug | INTRAMUSCULAR | Status: DC | PRN
  Administered 2018-03-20 – 2018-03-24 (×3): 25 ug via INTRAVENOUS
  Filled 2018-03-20 (×3): qty 2

## 2018-03-20 MED ORDER — PENTAFLUOROPROP-TETRAFLUOROETH EX AERO: 1.0000 "application " | INHALATION_SPRAY | CUTANEOUS | Status: DC | PRN

## 2018-03-20 MED ORDER — ALTEPLASE 2 MG IJ SOLR: 2.0000 mg | Freq: Once | INTRAMUSCULAR | Status: DC | PRN

## 2018-03-20 MED ORDER — ACETAMINOPHEN 160 MG/5ML PO SOLN
650.0000 mg | Freq: Three times a day (TID) | ORAL | Status: DC | PRN
  Administered 2018-03-20: 650 mg
  Filled 2018-03-20 (×2): qty 20.3

## 2018-03-20 MED ORDER — METOPROLOL TARTRATE 25 MG PO TABS
25.0000 mg | ORAL_TABLET | Freq: Two times a day (BID) | ORAL | Status: DC
  Administered 2018-03-20 – 2018-03-21 (×3): 25 mg
  Filled 2018-03-20 (×3): qty 1

## 2018-03-20 MED ORDER — LIDOCAINE-PRILOCAINE 2.5-2.5 % EX CREA
1.0000 "application " | TOPICAL_CREAM | CUTANEOUS | Status: DC | PRN
  Filled 2018-03-20: qty 5

## 2018-03-20 MED ORDER — LIDOCAINE HCL (PF) 1 % IJ SOLN: 5.0000 mL | INTRAMUSCULAR | Status: DC | PRN

## 2018-03-20 NOTE — Progress Notes (Signed)
  Speech Language Pathology Treatment: Dysphagia  Patient Details Name: Kenneth [name] MRN: [id number] DOB: 1956-09-11 Today's Date: 03/20/2018 Time: 9826-4158 SLP Time Calculation (min) (ACUTE ONLY): 8 min  Assessment / Plan / Recommendation Clinical Impression  Pt was lethargic throughout session exhibiting focused attention only and unable to sustain eye opening. Not given Haldol or fentanyl since yesterday morning however RN reports he has recently started Depakote. Pt was unable to follow simple commands despite repetition and additional time given. Oral care provided and mucosa appeared pink and moist. Opened oral cavity in response to ice to lip with decreased awareness and manipulation without pharyngeal swallow present. He did cough after several minutes with suspected airway compromise as ice melted. Continue oral hygeine and ST intervention with focus on increasing awareness, oral manipulation, breath support and volitional cough.    HPI HPI: 62 y/o male admitted on 5/14 after having a cardiac arrest in the setting of a PEA arrest and wide complex tachycardia.  Had 10 minutes of CPR. ETT 5/14-5/20. CT Head showed no acute changes but advanced cerebral and cerebellar atrophy, remote bilateral lacunar infarcts involving the basal ganglia. PMH:  ERSD, sCHF, EtOH abuse, HTN. Pt had a BSE in July 2017 recommending Dys 3 diet and thin liquids by cup due to cognitively-based dysphagia. He was advanced to regular textures and thin liquids by cup or straw prior to d/c.      SLP Plan  Continue with current plan of care       Recommendations  Diet recommendations: NPO Medication Administration: Via alternative means                Oral Care Recommendations: Oral care QID Follow up Recommendations: Skilled Nursing facility SLP Visit Diagnosis: Dysphagia, unspecified (R13.10) Plan: Continue with current plan of care       GO                Houston Siren 03/20/2018, 10:23  AM  Orbie Pyo Colvin Caroli.Ed [employer] [phone]

## 2018-03-20 NOTE — Progress Notes (Signed)
Dialysis treatment completed.  3500 mL ultrafiltrated and net fluid removal 3000 mL.    Patient status unchanged. Lung sounds diminished and coarse to ausculation in all fields. Generalized non pititng edema. Cardiac: NSR .  Disconnected lines and removed needles.  Pressure held for 10 minutes and band aid/gauze dressing applied.  Report given to bedside RN, Marcene Brawn.

## 2018-03-20 NOTE — Progress Notes (Signed)
Arrived to patient room 2H-08 at 1300.  Reviewed treatment plan and this RN agrees.  Report received from bedside RN, Marcene Brawn.  Consent verified.  Patient responds to voice, oriented to self only. Lung sounds diminished and coarse to ausculation in all fields. Generalized non pitting edema. Cardiac: NSR.  Prepped LUAVF with alcohol and cannulated with two 15 gauge needles.  Pulsation of blood noted.  Flushed access well with saline per protocol.  Connected and secured lines and initiated tx at 1330.  UF goal of 3500 mL and net fluid removal of 3000 mL.  Will continue to monitor.

## 2018-03-20 NOTE — Progress Notes (Signed)
PT Cancellation Note  Patient Details Name: Kenneth [name] MRN: [id number] DOB: November 19, 1955   Cancelled Treatment:     Attempted to evaluate patient 1027, patient just beginning HD, RN asks we try back tomorrow, will cont to follow.   Reinaldo Berber, PT, DPT Acute Rehab Services Pager: [phone]     Reinaldo Berber 03/20/2018, 2:02 PM

## 2018-03-20 NOTE — Progress Notes (Signed)
[facility] NEPHROLOGY PROGRESS NOTE  Assessment/ Plan: Pt is a 62 y.o. yo male   with hx of HTN, combined sdCHF and ESRD on chronic hemodialysis ([place] [facility]), with PEA cardiac arrest, s/p cooling procedure.  Dialysis: [facility] tts 4h 51min 84kg 2/2 bath Hep 2400 LUA AVF - venofer 50 / wk - mircera 75 ug q 2wks - calcitriol 2.75 ug tiw - sensipar 90 po tiw   Assessment/Plan:  # PEA cardiac arrest s/p cooling procedure.  #Acute respiratory failure/pulmonary edema: Extubated.  Currently on oxygen via nasal cannula.   Per critical care.  # ESRD: TTS, for dialysis today with 4K bath.  He has left upper extremity AV fistula.  # Anemia: has high ferritin level, continue  Aranesp weekly.  Monitor CBC.  Hemoglobin 9.3.  # Secondary hyperparathyroidism: Phosphorus elevated, plan to start binders when patient takes orally.  He is currently n.p.o. continue to monitor.  # HTN/volume: On metoprolol.  Monitor blood pressure.  Dialysis today.  #Delirium/agitation: Continue supportive care.  Per primary team.  No significant improvement.  Subjective: Seen and examined at bedside.  Alert but still some confusion.  Review of systems limited. Objective Vital signs in last 24 hours: Vitals:   03/20/18 0806 03/20/18 0818 03/20/18 0900 03/20/18 0913  BP:   (!) 124/59   Pulse:   76 96  Resp:   19   Temp:  99.1 F (37.3 C)    TempSrc:      SpO2: 98%  94%   Weight:      Height:       Weight change: 0.1 kg (3.5 oz)  Intake/Output Summary (Last 24 hours) at 03/20/2018 1100 Last data filed at 03/20/2018 0900 Gross per 24 hour  Intake 2099.17 ml  Output -  Net 2099.17 ml       Labs: Basic Metabolic Panel: Recent Labs  Lab 03/16/18 0359  03/18/18 1100 03/19/18 0405 03/20/18 0305  NA 129*   < > 137 137 134*  K 4.7   < > 3.9 3.3* 3.4*  CL 88*   < > 97* 93* 91*  CO2 23   < > 27 32 29  GLUCOSE 102*   < > 123* 112* 119*  BUN 78*   < > 69* 33* 60*  CREATININE 12.43*   <  > 10.92* 6.35* 8.72*  CALCIUM 8.6*   < > 8.9 8.5* 8.9  PHOS 7.9*  --  6.5*  --   --    < > = values in this interval not displayed.   Liver Function Tests: Recent Labs  Lab 03/16/18 0359 03/18/18 1100  ALBUMIN 2.3* 2.2*   No results for input(s): LIPASE, AMYLASE in the last 168 hours. No results for input(s): AMMONIA in the last 168 hours. CBC: Recent Labs  Lab 03/16/18 0359 03/17/18 1311 03/18/18 1100 03/20/18 0305  WBC 13.7*  --  10.5 14.1*  HGB 9.5*  --  9.0* 9.3*  HCT 27.9* 29.7* 26.6* 27.5*  MCV 95.5  --  96.4 98.2  PLT 260  --  252 283   Cardiac Enzymes: No results for input(s): CKTOTAL, CKMB, CKMBINDEX, TROPONINI in the last 168 hours. CBG: Recent Labs  Lab 03/19/18 1618 03/19/18 2010 03/19/18 2352 03/20/18 0411 03/20/18 0817  GLUCAP 114* 103* 90 103* 91    Iron Studies:  No results for input(s): IRON, TIBC, TRANSFERRIN, FERRITIN in the last 72 hours. Studies/Results: No results found.  Medications: Infusions: . sodium chloride Stopped (03/09/18 2142)  .  sodium chloride    . sodium chloride    . sodium chloride    . sodium chloride    . dextrose 15 mL/hr at 03/20/18 0928  . diltiazem (CARDIZEM) infusion Stopped (03/20/18 2956)  . famotidine (PEPCID) IV Stopped (03/19/18 2144)  . feeding supplement (NEPRO CARB STEADY) 45 mL/hr at 03/20/18 0900    Scheduled Medications: . aspirin  325 mg Per Tube Daily  . chlorhexidine  15 mL Mouth Rinse BID  . Chlorhexidine Gluconate Cloth  6 each Topical Daily  . darbepoetin (ARANESP) injection - DIALYSIS  40 mcg Intravenous Q Tue-HD  . diltiazem  60 mg Per Tube Q6H  . feeding supplement (PRO-STAT SUGAR FREE 64)  30 mL Per Tube BID  . heparin  5,000 Units Subcutaneous Q8H  . levalbuterol  0.63 mg Nebulization TID  . mouth rinse  15 mL Mouth Rinse q12n4p  . metoprolol tartrate  25 mg Per Tube BID  . sodium chloride flush  10-40 mL Intracatheter Q12H  . thiamine  100 mg Intravenous Daily  . valproic acid   500 mg Per Tube BID    have reviewed scheduled and prn medications.  Physical Exam: General: Confused male lying in bed, not in distress Heart: Regular rate rhythm S1-S2 normal Lungs: Bibasilar decreased breath sound, no wheezing Abdomen:soft, Non-tender, non-distended Extremities: No lower extremity edema. Dialysis Access: Left upper extremity AV fistula has good bruit and thrill.  Dron Prasad Bhandari 03/20/2018,11:00 AM  LOS: 11 days

## 2018-03-20 NOTE — Progress Notes (Signed)
Transferred patient to 4E07 via bed. All personal belongings sent with him. In stable condition. Report given to nurse.

## 2018-03-20 NOTE — Progress Notes (Signed)
PULMONARY / CRITICAL CARE MEDICINE   Name: Kenneth [name] MRN: [id number] DOB: 1955/11/07    ADMISSION DATE:  03/09/2018 CONSULTATION DATE:  03/09/18  REFERRING MD:  Stark Jock EDP  CHIEF COMPLAINT:  Cardiac arrest  HISTORY OF PRESENT ILLNESS:   62 y/o male with ERSD, sCHF who was admitted on 5/14 after having a cardiac arrest in the setting of a PEA arrest and wide complex tachycardia.  Had 10 minutes of CPR.   SUBJECTIVE:  Lethargic 5/23 Lethargic 5/25  VITAL SIGNS: BP 119/66   Pulse 78   Temp 99.1 F (37.3 C)   Resp 16   Ht 5\' 10"  (1.778 m)   Wt 188 lb 15 oz (85.7 kg)   SpO2 98%   BMI 27.11 kg/m   HEMODYNAMICS:    VENTILATOR SETTINGS:    INTAKE / OUTPUT: I/O last 3 completed shifts: In: 3184.2 [I.V.:1219.2; NG/GT:1915; IV Piggyback:50] Out: 2500 [Other:2500]  PHYSICAL EXAMINATION:  General: sedated , ill appearing frail male on [place] @ 1 L HENT: NCAT, Some upper airway noises, clearing secretions at present, [place]  PULM: bilateral coarse BS, UA secretion noise, thonchi >> For HD today ( 5/25) CV: S1, S2, RRR with few PAC's noted, BBB per tele GI: soft, non-tender, + BS MSK: no obvious deformities  Neuro: opened eyes to voice and attempts to talk but unintelligible, coughed on command, profoundly weak   LABS:  BMET Recent Labs  Lab 03/18/18 1100 03/19/18 0405 03/20/18 0305  NA 137 137 134*  K 3.9 3.3* 3.4*  CL 97* 93* 91*  CO2 27 32 29  BUN 69* 33* 60*  CREATININE 10.92* 6.35* 8.72*  GLUCOSE 123* 112* 119*    Electrolytes Recent Labs  Lab 03/14/18 1216  03/16/18 0359  03/18/18 1100 03/19/18 0405 03/20/18 0305  CALCIUM 8.7*   < > 8.6*   < > 8.9 8.5* 8.9  MG 2.3  --   --   --   --  2.1  --   PHOS  --   --  7.9*  --  6.5*  --   --    < > = values in this interval not displayed.    CBC Recent Labs  Lab 03/16/18 0359 03/17/18 1311 03/18/18 1100 03/20/18 0305  WBC 13.7*  --  10.5 14.1*  HGB 9.5*  --  9.0* 9.3*  HCT 27.9* 29.7* 26.6* 27.5*   PLT 260  --  252 283    Coag's No results for input(s): APTT, INR in the last 168 hours.  Sepsis Markers No results for input(s): LATICACIDVEN, PROCALCITON, O2SATVEN in the last 168 hours.  ABG No results for input(s): PHART, PCO2ART, PO2ART in the last 168 hours.  Liver Enzymes Recent Labs  Lab 03/16/18 0359 03/18/18 1100  ALBUMIN 2.3* 2.2*    Cardiac Enzymes No results for input(s): TROPONINI, PROBNP in the last 168 hours.  Glucose Recent Labs  Lab 03/19/18 1208 03/19/18 1618 03/19/18 2010 03/19/18 2352 03/20/18 0411 03/20/18 0817  GLUCAP 114* 114* 103* 90 103* 91    Imaging No results found.   STUDIES:  5/14 TTE> Severe LVH, LVEF 58-52%, grade 2 diastolic dysfunction, moderately decreased RV systolic funcion 7/78  head CT > no acute abnormality, advanced atrophy with small vesel ischemic disease  CULTURES:   ANTIBIOTICS:   SIGNIFICANT EVENTS:   LINES/TUBES: 5/14 ETT >> 5/20  DISCUSSION: 62 y/o male with a cardiac arrest on 5/14 in the setting of hyperkalemia and acute pulmonary edema.  He has HFpEF,  severe LVH and ESRD.  Extubated on 5/20, has been confused, struggling with [employer] with RVR since.  ASSESSMENT / PLAN:  PULMONARY A: Acute respiratory failure with hypoxemia > resolved Aspiration risk due to poor airway protection Sounds wet, will benefit from HD today 5/25 P:   Continue NPO Aggressive pulmonary hygiene May need some prn NTS depending on his ability to manage his secretions.  HOB > 30 degrees Titrate oxygen for sats > 92%  CARDIOVASCULAR A:  Severe LVH, HFpEF Pulmonary hypertension, WHO2 Hypertension baseline Afib with RVR non-compliant with anticoagulation at home>> Now SR with PAC's P:  Tele Dilt gtt off as of 5/25 @ 6:30 am ASA as ordered Continue metoprolol 25 bid Continue cardizem per [place]  RENAL A:   ESRD Hyponatremia resolved>> 134 on 5/25 Hypokalemia P:   Current HD schedule TThS>> For HD 5/25 Trend  BMET Monitor and Replete electrolytes as able  GASTROINTESTINAL A:   Nausea/vomiting > resolved Dysphagia due to mental status No recent BM P:   NPO Cor Track in place>> TF @ 45 per hour, tolerating well Will add prn lactulose   HEMATOLOGIC A:   Anemia of ESRD without bleeding Leukocytosis P:  Monitor for obvious bleeding Trend CBC Continue current heparin    INFECTIOUS A:   No acute issues T Max 99.3 Bump in WBC P:   Following clinically CXR 5/26 D/C CVC if PIV can be obtained   ENDOCRINE A:   Hypoglycemia  P:   Following CBG, currently on D10 25cc/h, will decrease to 15 cc / hr with goal of weaning off if tolerated  NEUROLOGIC A:   Ongoing confusion post extubation> still suspect this is toxic metabolic encephalopathy related to prior heavy EtOH abuse, ESRD, recent cardiac arrest and heavy sedation while intubated; Neuro exam non-focal P:   Minimize sedation, has haldol ordered currently Thiamine and folate as ordered Continue to re-orient, hold sedation.  Pt continues to require sedation, will need brain imaging if MS does not improve, and based on results review goals of care with family.   Pt. Is sedated and calm, off cardizem gtt since this am. If patient remains stable and rate controlled consider transfer to Step Down Bed.   FAMILY  - Updates: none bedside  - Inter-disciplinary family meet or Palliative Care meeting due by: 5/25   APP CC time 32 minutes  Magdalen Spatz, AGACNP-BC 03/20/2018, 8:40 AM  Pulmonary and Critical Care [phone]

## 2018-03-21 ENCOUNTER — Inpatient Hospital Stay (HOSPITAL_COMMUNITY): Payer: Medicare Other

## 2018-03-21 DIAGNOSIS — Z992 Dependence on renal dialysis: Secondary | ICD-10-CM

## 2018-03-21 DIAGNOSIS — R4702 Dysphasia: Secondary | ICD-10-CM

## 2018-03-21 LAB — AMMONIA: Ammonia: 25 umol/L (ref 9–35)

## 2018-03-21 LAB — CBC
HCT: 30.9 % — ABNORMAL LOW (ref 39.0–52.0)
Hemoglobin: 10.5 g/dL — ABNORMAL LOW (ref 13.0–17.0)
MCH: 32.8 pg (ref 26.0–34.0)
MCHC: 34 g/dL (ref 30.0–36.0)
MCV: 96.6 fL (ref 78.0–100.0)
PLATELETS: 330 10*3/uL (ref 150–400)
RBC: 3.2 MIL/uL — ABNORMAL LOW (ref 4.22–5.81)
RDW: 13.4 % (ref 11.5–15.5)
WBC: 17.1 10*3/uL — ABNORMAL HIGH (ref 4.0–10.5)

## 2018-03-21 LAB — VITAMIN B12: VITAMIN B 12: 604 pg/mL (ref 180–914)

## 2018-03-21 LAB — OCCULT BLOOD X 1 CARD TO LAB, STOOL: FECAL OCCULT BLD: NEGATIVE

## 2018-03-21 LAB — RETICULOCYTES
RBC.: 3.19 MIL/uL — ABNORMAL LOW (ref 4.22–5.81)
Retic Count, Absolute: 63.8 10*3/uL (ref 19.0–186.0)
Retic Ct Pct: 2 % (ref 0.4–3.1)

## 2018-03-21 LAB — BASIC METABOLIC PANEL
ANION GAP: 12 (ref 5–15)
BUN: 32 mg/dL — ABNORMAL HIGH (ref 6–20)
CALCIUM: 8.9 mg/dL (ref 8.9–10.3)
CO2: 27 mmol/L (ref 22–32)
Chloride: 95 mmol/L — ABNORMAL LOW (ref 101–111)
Creatinine, Ser: 5.81 mg/dL — ABNORMAL HIGH (ref 0.61–1.24)
GFR calc Af Amer: 11 mL/min — ABNORMAL LOW (ref >60)
GFR, EST NON AFRICAN AMERICAN: 9 mL/min — AB (ref >60)
GLUCOSE: 97 mg/dL (ref 65–99)
Potassium: 3.9 mmol/L (ref 3.5–5.1)
SODIUM: 134 mmol/L — AB (ref 135–145)

## 2018-03-21 LAB — IRON AND TIBC
IRON: 49 ug/dL (ref 45–182)
Saturation Ratios: 24 % (ref 17.9–39.5)
TIBC: 207 ug/dL — ABNORMAL LOW (ref 250–450)
UIBC: 158 ug/dL

## 2018-03-21 LAB — GLUCOSE, CAPILLARY
GLUCOSE-CAPILLARY: 108 mg/dL — AB (ref 65–99)
GLUCOSE-CAPILLARY: 95 mg/dL (ref 65–99)
GLUCOSE-CAPILLARY: 89 mg/dL (ref 65–99)
Glucose-Capillary: 83 mg/dL (ref 65–99)
Glucose-Capillary: 94 mg/dL (ref 65–99)
Glucose-Capillary: 97 mg/dL (ref 65–99)

## 2018-03-21 LAB — FERRITIN: Ferritin: 1273 ng/mL — ABNORMAL HIGH (ref 24–336)

## 2018-03-21 LAB — MAGNESIUM: MAGNESIUM: 2.1 mg/dL (ref 1.7–2.4)

## 2018-03-21 LAB — FOLATE: Folate: 11.6 ng/mL (ref >5.9)

## 2018-03-21 MED ORDER — LORAZEPAM 2 MG/ML IJ SOLN: 2.0000 mg | INTRAMUSCULAR | Status: DC | PRN

## 2018-03-21 MED ORDER — THIAMINE HCL 100 MG/ML IJ SOLN
100.0000 mg | Freq: Every day | INTRAMUSCULAR | Status: DC
  Administered 2018-03-21 – 2018-03-27 (×7): 100 mg via INTRAVENOUS
  Filled 2018-03-21 (×7): qty 2

## 2018-03-21 MED ORDER — DILTIAZEM HCL 25 MG/5ML IV SOLN
5.0000 mg | Freq: Four times a day (QID) | INTRAVENOUS | Status: DC | PRN
  Filled 2018-03-21: qty 5

## 2018-03-21 MED ORDER — NICOTINE 21 MG/24HR TD PT24
21.0000 mg | MEDICATED_PATCH | Freq: Every day | TRANSDERMAL | Status: DC
  Administered 2018-03-21 – 2018-03-27 (×7): 21 mg via TRANSDERMAL
  Filled 2018-03-21 (×7): qty 1

## 2018-03-21 MED ORDER — DEXTROSE-NACL 5-0.9 % IV SOLN
INTRAVENOUS | Status: DC
  Administered 2018-03-21 – 2018-03-26 (×2): via INTRAVENOUS

## 2018-03-21 MED ORDER — DIAZEPAM 5 MG/ML IJ SOLN
2.5000 mg | Freq: Once | INTRAMUSCULAR | Status: AC
  Administered 2018-03-21: 2.5 mg via INTRAVENOUS
  Filled 2018-03-21 (×3): qty 2

## 2018-03-21 MED ORDER — FOLIC ACID 5 MG/ML IJ SOLN
1.0000 mg | Freq: Every day | INTRAMUSCULAR | Status: DC
  Administered 2018-03-21 – 2018-03-26 (×6): 1 mg via INTRAVENOUS
  Filled 2018-03-21 (×8): qty 0.2

## 2018-03-21 NOTE — Progress Notes (Signed)
Cardizem suspension not available on the unit, pharmacy notified twice to send med, this RN is still awaiting med.

## 2018-03-21 NOTE — Progress Notes (Signed)
PROGRESS NOTE    Kenneth [name]  TKP:[id number] DOB: 05-31-1956 DOA: 03/09/2018 PCP: [name], MD   Brief Narrative:  62 year old BM PMHx ESRD on HD T/TH/Sat, EtOH abuse, Cardiomyopathy secondary, Chronic combined systolic and diastolic heart failure HTN.  Frequent recent admissions for 'flash' pulmonary edema over the last 3 months.  Usually dialyzed  [facility] and attended last session on 5/11.  Presented this morning with worsening dyspnea since last session on Saturday. He denies fevers, chills, or productive cough.  Initially only mild respiratory distress, but then suddenly collapsed while getting up to use commode. CPR initiated for PEA, received empiric treatment for hyperkalemia. Shock x1 for WCT, also given amiodarone. ROSC after 10 mins of ACLS.    Subjective: 5/26 last 24 hours afebrileA/O 1 (does not wear, when, why). Negative CP, negative SOB, negative abdominal pain. Babbles unintelligibly.     Assessment & Plan:   Active Problems:   Cardiac arrest ([place])   Acute encephalopathy    Acute respiratory failure with hypoxia -Resolved on room air. -continue aspiration risk secondary to altered mental status. -Aspiration protocol -Maintain head of bed> 30 at all times -Titrate O2 to maintain SPO2> 92%  PEA arrest S/P cooling procedure - CPR plus shock x1 with ROSC after 10 minutes  Chronic diastolic CHF/Severe LVH -Strict in and out -daily weight -Cardizem 60 mg QID -metoprolol 25 mg BID  A. Fib with RVR -rate controlled -Subcutaneous heparin -see CHF -Noncompliant with home anticoagulation    Essential HTN -See CHF  ESRD on HD T/TH/Sat, -Dialysis per nephrology  Dysphagia -Cor track in place     Anemia of ESRD without bleeding? -Anemia panel pending -Occult blood pending  Leukocytosis -Unknown cause currently negative fever, negative overt signs of infection however patient spikes fever will panculture. Hold antibiotics  for now  Hx CVA -No acute CVA per CT scan. However remote infarcts see results below.  Altered mental status -Multifactorial post extubation, EtOH abuse, end-stage renal disease, recent cardiac arrest, iatrogenic (heavy sedation)   -minimize sedating medication  EtOH abuse - CIWA protocol       DVT prophylaxis: Subcu heparin Code Status: Full Family Communication: None Disposition Plan: TBD  Consultants:  PCCM Nephrology     Procedures/Significant Events:  5/14Echocardiogram:Left ventricle: - severe concentric hypertrophy. Systolic function was normal. Wall -Ggrade 2 diastolic dysfunction). -- Right ventricle: The cavity size was moderately dilated.   5/20 CT head WO contrast: -no acute infarct. -Advanced cerebral and cerebellar atrophy with chronic small vessel ischemic change.  -bilateral remote lacunar infarcts involving basal ganglia       I have personally reviewed and interpreted all radiology studies and my findings are as above.  VENTILATOR SETTINGS:    Cultures   Antimicrobials: Anti-infectives (From admission, onward)   None       Devices    LINES / TUBES:      Continuous Infusions: . sodium chloride Stopped (03/09/18 2142)  . sodium chloride    . sodium chloride    . famotidine (PEPCID) IV Stopped (03/20/18 2330)  . feeding supplement (NEPRO CARB STEADY) 1,000 mL (03/21/18 0332)     Objective: Vitals:   03/21/18 0005 03/21/18 0146 03/21/18 0445 03/21/18 0753  BP: 116/69 135/87 (!) 153/69 127/75  Pulse:      Resp: (!) 21 (!) 22 20 16   Temp: 98.3 F (36.8 C)  99 F (37.2 C) 98.9 F (37.2 C)  TempSrc: Oral  Oral Oral  SpO2: 94% 97% 98%  96%  Weight:   184 lb 15.5 oz (83.9 kg)   Height:        Intake/Output Summary (Last 24 hours) at 03/21/2018 8466 Last data filed at 03/21/2018 0700 Gross per 24 hour  Intake 1704.67 ml  Output 3000 ml  Net -1295.33 ml   Filed Weights   03/20/18 1730 03/20/18 1946 03/21/18 0445    Weight: 182 lb 5.1 oz (82.7 kg) 185 lb 3 oz (84 kg) 184 lb 15.5 oz (83.9 kg)    Examination:  General: A/O 1 (does not know where, when, why),No acute respiratory distress Neck:  Negative scars, masses, torticollis, lymphadenopathy, JVD Lungs: Clear to auscultation bilaterally without wheezes or crackles Cardiovascular: Regular rate and rhythm, positive grade 3/6 holosystolic murmur, negative gallop, negative rub, normal  S1/S2 Abdomen: negative abdominal pain, nondistended, positive soft, bowel sounds, no rebound, no ascites, no appreciable mass Extremities: No significant cyanosis, clubbing, or edema bilateral lower extremities Skin: Negative rashes, lesions, ulcers Psychiatric:  Unable to evaluate secondary to patient's altered mental status.  Central nervous system:  Patient follows some commands, babbles unintelligibly, unable to complete exam secondary to patient's altered mental status. .     Data Reviewed: Care during the described time interval was provided by me .  I have reviewed this patient's available data, including medical history, events of note, physical examination, and all test results as part of my evaluation.   CBC: Recent Labs  Lab 03/16/18 0359 03/17/18 1311 03/18/18 1100 03/20/18 0305 03/21/18 0326  WBC 13.7*  --  10.5 14.1* 17.1*  HGB 9.5*  --  9.0* 9.3* 10.5*  HCT 27.9* 29.7* 26.6* 27.5* 30.9*  MCV 95.5  --  96.4 98.2 96.6  PLT 260  --  252 283 599   Basic Metabolic Panel: Recent Labs  Lab 03/14/18 1216  03/16/18 0359 03/17/18 0333 03/18/18 1100 03/19/18 0405 03/20/18 0305 03/21/18 0326  NA 132*   < > 129* 137 137 137 134* 134*  K 4.9   < > 4.7 4.1 3.9 3.3* 3.4* 3.9  CL 93*   < > 88* 95* 97* 93* 91* 95*  CO2 28   < > 23 29 27  32 29 27  GLUCOSE 137*   < > 102* 89 123* 112* 119* 97  BUN 40*   < > 78* 36* 69* 33* 60* 32*  CREATININE 8.65*   < > 12.43* 7.23* 10.92* 6.35* 8.72* 5.81*  CALCIUM 8.7*   < > 8.6* 8.5* 8.9 8.5* 8.9 8.9  MG 2.3   --   --   --   --  2.1  --  2.1  PHOS  --   --  7.9*  --  6.5*  --   --   --    < > = values in this interval not displayed.   GFR: Estimated Creatinine Clearance: 13.3 mL/min (A) (by C-G formula based on SCr of 5.81 mg/dL (H)). Liver Function Tests: Recent Labs  Lab 03/16/18 0359 03/18/18 1100  ALBUMIN 2.3* 2.2*   No results for input(s): LIPASE, AMYLASE in the last 168 hours. Recent Labs  Lab 03/21/18 0326  AMMONIA 25   Coagulation Profile: No results for input(s): INR, PROTIME in the last 168 hours. Cardiac Enzymes: No results for input(s): CKTOTAL, CKMB, CKMBINDEX, TROPONINI in the last 168 hours. BNP (last 3 results) No results for input(s): PROBNP in the last 8760 hours. HbA1C: No results for input(s): HGBA1C in the last 72 hours. CBG: Recent Labs  Lab  03/20/18 1626 03/20/18 2031 03/21/18 0010 03/21/18 0443 03/21/18 0748  GLUCAP 97 98 94 97 108*   Lipid Profile: No results for input(s): CHOL, HDL, LDLCALC, TRIG, CHOLHDL, LDLDIRECT in the last 72 hours. Thyroid Function Tests: No results for input(s): TSH, T4TOTAL, FREET4, T3FREE, THYROIDAB in the last 72 hours. Anemia Panel: No results for input(s): VITAMINB12, FOLATE, FERRITIN, TIBC, IRON, RETICCTPCT in the last 72 hours. Urine analysis:    Component Value Date/Time   COLORURINE YELLOW 05/12/2016 0834   APPEARANCEUR CLOUDY (A) 05/12/2016 0834   LABSPEC 1.014 05/12/2016 0834   PHURINE 5.0 05/12/2016 0834   GLUCOSEU NEGATIVE 05/12/2016 0834   HGBUR MODERATE (A) 05/12/2016 0834   BILIRUBINUR NEGATIVE 05/12/2016 0834   KETONESUR NEGATIVE 05/12/2016 0834   PROTEINUR 100 (A) 05/12/2016 0834   UROBILINOGEN 0.2 12/09/2013 0013   NITRITE NEGATIVE 05/12/2016 0834   LEUKOCYTESUR NEGATIVE 05/12/2016 0834   Sepsis Labs: @LABRCNTIP (procalcitonin:4,lacticidven:4)  )No results found for this or any previous visit (from the past 240 hour(s)).       Radiology Studies: Dg Chest Port 1 View  Result Date:  03/21/2018 CLINICAL DATA:  Encounter for respiratory failure. EXAM: PORTABLE CHEST 1 VIEW COMPARISON:  None. FINDINGS: Heart size is normal. There is no edema or effusion. No focal airspace disease is present. Feeding tube is in place. Lung volumes are low. IMPRESSION: No active disease. Electronically Signed   By: San Morelle M.D.   On: 03/21/2018 08:40        Scheduled Meds: . aspirin  325 mg Per Tube Daily  . chlorhexidine  15 mL Mouth Rinse BID  . darbepoetin (ARANESP) injection - DIALYSIS  40 mcg Intravenous Q Tue-HD  . diltiazem  60 mg Per Tube Q6H  . feeding supplement (PRO-STAT SUGAR FREE 64)  30 mL Per Tube BID  . heparin  5,000 Units Subcutaneous Q8H  . levalbuterol  0.63 mg Nebulization TID  . mouth rinse  15 mL Mouth Rinse q12n4p  . metoprolol tartrate  25 mg Per Tube BID  . thiamine  100 mg Intravenous Daily  . valproic acid  500 mg Per Tube BID   Continuous Infusions: . sodium chloride Stopped (03/09/18 2142)  . sodium chloride    . sodium chloride    . famotidine (PEPCID) IV Stopped (03/20/18 2330)  . feeding supplement (NEPRO CARB STEADY) 1,000 mL (03/21/18 0332)     LOS: 12 days    Time spent: 40 minutes    Sanai Frick, Geraldo Docker, MD [facility] Pager [phone]   If 7PM-7AM, please contact night-coverage [email or web address] Password [facility] 03/21/2018, 8:52 AM

## 2018-03-21 NOTE — Progress Notes (Signed)
Patient arrived from [facility] on a hospital bed, assessment completed see flowsheet, no sign of distress noted, placed on tele ccmd notified, patient oriented to room and staff, bed in lowest position, bed alarm on, will continue to monitor

## 2018-03-21 NOTE — Progress Notes (Signed)
Pt pulled out Peripheral IV's x 2.  Re-insertion attempted x 2- unsuccessful.  Haldol given at 5:27pm for agitation.  IV consult requested.  During IV consult Pt noted combative with staff and pulled out Coretrak feeding tube.  MD paged.   Anson Crofts, RN

## 2018-03-21 NOTE — Progress Notes (Signed)
Addressed patient to identify myself and my purpose of the visit. While attempting to place tourniquet onto patient's right arm, he pulled away and swung his arm in my direction. I stepped back a few paces and informed patient again of my purpose. During which, he reached up and pulled at his Coretrak, partially displacing it. I stopped the infusion of TF and alerted patient's nurse. Will pass on consult for another attempt.

## 2018-03-21 NOTE — Progress Notes (Signed)
[facility] NEPHROLOGY PROGRESS NOTE  Assessment/ Plan: Pt is a 62 y.o. yo male   with hx of HTN, combined sdCHF and ESRD on chronic hemodialysis ([place] TTS), with PEA cardiac arrest, s/p cooling procedure.  Dialysis: [facility] tts 4h 7min 84kg 2/2 bath Hep 2400 LUA AVF - venofer 50 / wk - mircera 75 ug q 2wks - calcitriol 2.75 ug tiw - sensipar 90 po tiw   Assessment/Plan:  # PEA cardiac arrest s/p cooling procedure.  #Acute respiratory failure/pulmonary edema: Extubated.  Now on room air.  Transferred to stepdown unit.  # ESRD: TTS, tolerated dialysis yesterday.  He has left upper extremity AV fistula.  Plan for dialysis on Tuesday.  # Anemia: has high ferritin level, continue  Aranesp weekly.  Monitor CBC.  Hemoglobin 10.5.  # Secondary hyperparathyroidism: Phosphorus 6.5.  Currently n.p.o., plan to start binders when patient takes orally.   # HTN/volume: On metoprolol and diltiazem.  Monitor blood pressure.  Dialysis today.  #Delirium/agitation: Continue supportive care.  Per primary team.  Alert and oriented to himself only. Subjective: Seen and examined in a stepdown unit.  He was alert awake and oriented to himself only.  Review of systems limited.  Objective Vital signs in last 24 hours: Vitals:   03/21/18 0146 03/21/18 0445 03/21/18 0753 03/21/18 0929  BP: 135/87 (!) 153/69 127/75 123/73  Pulse:    (!) 104  Resp: (!) 22 20 16    Temp:  99 F (37.2 C) 98.9 F (37.2 C)   TempSrc:  Oral Oral   SpO2: 97% 98% 96%   Weight:  83.9 kg (184 lb 15.5 oz)    Height:       Weight change: 0 kg (0 lb)  Intake/Output Summary (Last 24 hours) at 03/21/2018 1008 Last data filed at 03/21/2018 0700 Gross per 24 hour  Intake 1370 ml  Output 3000 ml  Net -1630 ml       Labs: Basic Metabolic Panel: Recent Labs  Lab 03/16/18 0359  03/18/18 1100 03/19/18 0405 03/20/18 0305 03/21/18 0326  NA 129*   < > 137 137 134* 134*  K 4.7   < > 3.9 3.3* 3.4* 3.9  CL  88*   < > 97* 93* 91* 95*  CO2 23   < > 27 32 29 27  GLUCOSE 102*   < > 123* 112* 119* 97  BUN 78*   < > 69* 33* 60* 32*  CREATININE 12.43*   < > 10.92* 6.35* 8.72* 5.81*  CALCIUM 8.6*   < > 8.9 8.5* 8.9 8.9  PHOS 7.9*  --  6.5*  --   --   --    < > = values in this interval not displayed.   Liver Function Tests: Recent Labs  Lab 03/16/18 0359 03/18/18 1100  ALBUMIN 2.3* 2.2*   No results for input(s): LIPASE, AMYLASE in the last 168 hours. Recent Labs  Lab 03/21/18 0326  AMMONIA 25   CBC: Recent Labs  Lab 03/16/18 0359  03/18/18 1100 03/20/18 0305 03/21/18 0326  WBC 13.7*  --  10.5 14.1* 17.1*  HGB 9.5*  --  9.0* 9.3* 10.5*  HCT 27.9*   < > 26.6* 27.5* 30.9*  MCV 95.5  --  96.4 98.2 96.6  PLT 260  --  252 283 330   < > = values in this interval not displayed.   Cardiac Enzymes: No results for input(s): CKTOTAL, CKMB, CKMBINDEX, TROPONINI in the last 168 hours. CBG: Recent  Labs  Lab 03/20/18 1626 03/20/18 2031 03/21/18 0010 03/21/18 0443 03/21/18 0748  GLUCAP 97 98 94 97 108*    Iron Studies:  No results for input(s): IRON, TIBC, TRANSFERRIN, FERRITIN in the last 72 hours. Studies/Results: Dg Chest Port 1 View  Result Date: 03/21/2018 CLINICAL DATA:  Encounter for respiratory failure. EXAM: PORTABLE CHEST 1 VIEW COMPARISON:  None. FINDINGS: Heart size is normal. There is no edema or effusion. No focal airspace disease is present. Feeding tube is in place. Lung volumes are low. IMPRESSION: No active disease. Electronically Signed   By: San Morelle M.D.   On: 03/21/2018 08:40    Medications: Infusions: . sodium chloride Stopped (03/09/18 2142)  . sodium chloride    . sodium chloride    . famotidine (PEPCID) IV Stopped (03/20/18 2330)  . feeding supplement (NEPRO CARB STEADY) 1,000 mL (03/21/18 0332)    Scheduled Medications: . aspirin  325 mg Per Tube Daily  . chlorhexidine  15 mL Mouth Rinse BID  . darbepoetin (ARANESP) injection - DIALYSIS   40 mcg Intravenous Q Tue-HD  . diltiazem  60 mg Per Tube Q6H  . feeding supplement (PRO-STAT SUGAR FREE 64)  30 mL Per Tube BID  . heparin  5,000 Units Subcutaneous Q8H  . levalbuterol  0.63 mg Nebulization TID  . mouth rinse  15 mL Mouth Rinse q12n4p  . metoprolol tartrate  25 mg Per Tube BID  . thiamine  100 mg Intravenous Daily  . valproic acid  500 mg Per Tube BID    have reviewed scheduled and prn medications.  Physical Exam: General: Lying on bed, not in distress Heart: Regular rate rhythm S1-S2 normal Lungs: Bibasilar decreased breath sound, no wheezing Abdomen:soft, Non-tender, non-distended Extremities: No lower extremity edema Dialysis Access: Left upper extremity AV fistula has good bruit and thrill.  Andriy Sherk Prasad Coretha Creswell 03/21/2018,10:08 AM  LOS: 12 days

## 2018-03-21 NOTE — Evaluation (Signed)
Physical Therapy Evaluation Patient Details Name: Kenneth [name] MRN: [id number] DOB: [birth year] Today's Date: 03/21/2018   History of Present Illness  Pt presented with SOB and suffered cardiac arrest in ED and underwent CPR x 10 minutes. Pt s/p hypothermia protocol. Intubated 5/14. Extubated 5/20. PMH - ESRD on HD, chf, pulmonary edema, HTN, ETOH  Clinical Impression  Pt admitted with above diagnosis and presents to PT with functional limitations due to deficits listed below (See PT problem list). Pt needs skilled PT to maximize independence and safety to allow discharge to ST-SNF for further rehab. Unsure of home support.      Follow Up Recommendations SNF    Equipment Recommendations  None recommended by PT    Recommendations for Other Services OT consult     Precautions / Restrictions Precautions Precautions: Fall Restrictions Weight Bearing Restrictions: No      Mobility  Bed Mobility Overal bed mobility: Needs Assistance Bed Mobility: Supine to Sit;Sit to Supine     Supine to sit: Min assist Sit to supine: Min guard   General bed mobility comments: Assist to elevated trunk into sitting.  Transfers Overall transfer level: Needs assistance Equipment used: Rolling walker (2 wheeled) Transfers: Sit to/from Stand Sit to Stand: +2 physical assistance;Mod assist         General transfer comment: Assist to bring hips up and for balance  Ambulation/Gait Ambulation/Gait assistance: Mod assist;+2 safety/equipment Ambulation Distance (Feet): 80 Feet Assistive device: Rolling walker (2 wheeled) Gait Pattern/deviations: Step-through pattern;Decreased stride length;Drifts right/left;Trunk flexed Gait velocity: decr Gait velocity interpretation: <1.8 ft/sec, indicate of risk for recurrent falls General Gait Details: Assist for balance and support. Frequent cues and assist to stay closer to walker. Increasing assist as distance increased  Stairs             Wheelchair Mobility    Modified Rankin (Stroke Patients Only)       Balance Overall balance assessment: Needs assistance Sitting-balance support: No upper extremity supported;Feet supported Sitting balance-Leahy Scale: Fair     Standing balance support: Bilateral upper extremity supported Standing balance-Leahy Scale: Poor Standing balance comment: walker and min assist for static standing                             Pertinent Vitals/Pain Pain Assessment: Faces Faces Pain Scale: No hurt    Home Living Family/patient expects to be discharged to:: Private residence     Type of Home: Apartment Home Access: Level entry     Home Layout: One level Home Equipment: Walker - 2 wheels Additional Comments: Information taken from prior admission. Pt confused and unable to verify.    Prior Function                 Hand Dominance   Dominant Hand: Right    Extremity/Trunk Assessment   Upper Extremity Assessment Upper Extremity Assessment: Defer to OT evaluation    Lower Extremity Assessment Lower Extremity Assessment: Generalized weakness       Communication   Communication: Other (comment)(speech mumbled)  Cognition Arousal/Alertness: Awake/alert Behavior During Therapy: Restless Overall Cognitive Status: Impaired/Different from baseline Area of Impairment: Orientation;Attention;Memory;Following commands;Safety/judgement;Awareness;Problem solving                 Orientation Level: Disoriented to;Place;Time;Situation Current Attention Level: Sustained Memory: Decreased short-term memory;Decreased recall of precautions Following Commands: Follows one step commands consistently Safety/Judgement: Decreased awareness of safety;Decreased awareness of deficits Awareness: Intellectual Problem Solving: Slow processing;Decreased  initiation;Difficulty sequencing;Requires verbal cues;Requires tactile cues        General Comments       Exercises     Assessment/Plan    PT Assessment Patient needs continued PT services  PT Problem List Decreased strength;Decreased activity tolerance;Decreased balance;Decreased mobility;Decreased knowledge of use of DME;Decreased safety awareness;Decreased knowledge of precautions;Decreased cognition       PT Treatment Interventions DME instruction;Gait training;Functional mobility training;Therapeutic activities;Therapeutic exercise;Balance training;Cognitive remediation;Patient/family education    PT Goals (Current goals can be found in the Care Plan section)  Acute Rehab PT Goals Patient Stated Goal: Pt unable  PT Goal Formulation: Patient unable to participate in goal setting Time For Goal Achievement: 04/04/18 Potential to Achieve Goals: Good    Frequency Min 3X/week   Barriers to discharge Decreased caregiver support Unsure of family support    Co-evaluation               AM-PAC PT "6 Clicks" Daily Activity  Outcome Measure Difficulty turning over in bed (including adjusting bedclothes, sheets and blankets)?: A Little Difficulty moving from lying on back to sitting on the side of the bed? : Unable Difficulty sitting down on and standing up from a chair with arms (e.g., wheelchair, bedside commode, etc,.)?: Unable Help needed moving to and from a bed to chair (including a wheelchair)?: A Lot Help needed walking in hospital room?: A Lot Help needed climbing 3-5 steps with a railing? : Total 6 Click Score: 10    End of Session Equipment Utilized During Treatment: Gait belt Activity Tolerance: Patient limited by fatigue Patient left: in bed;with call bell/phone within reach;with bed alarm set Nurse Communication: Mobility status PT Visit Diagnosis: Unsteadiness on feet (R26.81);Other abnormalities of gait and mobility (R26.89);Muscle weakness (generalized) (M62.81)    Time: 0950-1006 PT Time Calculation (min) (ACUTE ONLY): 16 min   Charges:   PT  Evaluation $PT Eval Moderate Complexity: 1 Mod     PT G CodesMarland Kitchen        [facility] [place] 03/21/2018, 10:47 AM

## 2018-03-22 DIAGNOSIS — J810 Acute pulmonary edema: Secondary | ICD-10-CM

## 2018-03-22 LAB — GLUCOSE, CAPILLARY
GLUCOSE-CAPILLARY: 83 mg/dL (ref 65–99)
GLUCOSE-CAPILLARY: 86 mg/dL (ref 65–99)
Glucose-Capillary: 85 mg/dL (ref 65–99)
Glucose-Capillary: 96 mg/dL (ref 65–99)
Glucose-Capillary: 86 mg/dL (ref 65–99)

## 2018-03-22 LAB — CBC WITH DIFFERENTIAL/PLATELET
BASOS ABS: 0.2 10*3/uL — AB (ref 0.0–0.1)
Basophils Relative: 1 %
EOS ABS: 0.9 10*3/uL — AB (ref 0.0–0.7)
Eosinophils Relative: 6 %
HCT: 34.2 % — ABNORMAL LOW (ref 39.0–52.0)
Hemoglobin: 11.4 g/dL — ABNORMAL LOW (ref 13.0–17.0)
LYMPHS ABS: 2 10*3/uL (ref 0.7–4.0)
Lymphocytes Relative: 13 %
MCH: 32.8 pg (ref 26.0–34.0)
MCHC: 33.3 g/dL (ref 30.0–36.0)
MCV: 98.3 fL (ref 78.0–100.0)
Monocytes Absolute: 1.6 10*3/uL — ABNORMAL HIGH (ref 0.1–1.0)
Monocytes Relative: 10 %
NEUTROS ABS: 10.8 10*3/uL — AB (ref 1.7–7.7)
Neutrophils Relative %: 70 %
Platelets: 343 10*3/uL (ref 150–400)
RBC: 3.48 MIL/uL — AB (ref 4.22–5.81)
RDW: 13.8 % (ref 11.5–15.5)
WBC: 15.5 10*3/uL — AB (ref 4.0–10.5)

## 2018-03-22 LAB — BASIC METABOLIC PANEL
ANION GAP: 14 (ref 5–15)
BUN: 54 mg/dL — AB (ref 6–20)
CO2: 26 mmol/L (ref 22–32)
CREATININE: 8.44 mg/dL — AB (ref 0.61–1.24)
Calcium: 9.5 mg/dL (ref 8.9–10.3)
Chloride: 98 mmol/L — ABNORMAL LOW (ref 101–111)
GFR calc non Af Amer: 6 mL/min — ABNORMAL LOW (ref >60)
GFR, EST AFRICAN AMERICAN: 7 mL/min — AB (ref >60)
Glucose, Bld: 82 mg/dL (ref 65–99)
Potassium: 3.8 mmol/L (ref 3.5–5.1)
SODIUM: 138 mmol/L (ref 135–145)

## 2018-03-22 LAB — MAGNESIUM: MAGNESIUM: 2.5 mg/dL — AB (ref 1.7–2.4)

## 2018-03-22 MED ORDER — HALOPERIDOL LACTATE 5 MG/ML IJ SOLN
5.0000 mg | Freq: Four times a day (QID) | INTRAMUSCULAR | Status: DC | PRN
  Administered 2018-03-23 – 2018-03-26 (×5): 5 mg via INTRAVENOUS
  Filled 2018-03-22 (×6): qty 1

## 2018-03-22 MED ORDER — VALPROATE SODIUM 500 MG/5ML IV SOLN
500.0000 mg | Freq: Two times a day (BID) | INTRAVENOUS | Status: DC
  Administered 2018-03-22 – 2018-03-26 (×10): 500 mg via INTRAVENOUS
  Filled 2018-03-22 (×13): qty 5

## 2018-03-22 MED ORDER — CHLORHEXIDINE GLUCONATE CLOTH 2 % EX PADS
6.0000 | MEDICATED_PAD | Freq: Every day | CUTANEOUS | Status: DC
  Administered 2018-03-24: 6 via TOPICAL

## 2018-03-22 MED ORDER — ACETAMINOPHEN 650 MG RE SUPP
650.0000 mg | Freq: Four times a day (QID) | RECTAL | Status: DC | PRN
Start: 2018-03-22 — End: 2018-03-27

## 2018-03-22 MED ORDER — METOPROLOL TARTRATE 5 MG/5ML IV SOLN
5.0000 mg | Freq: Four times a day (QID) | INTRAVENOUS | Status: DC
  Administered 2018-03-22 – 2018-03-27 (×18): 5 mg via INTRAVENOUS
  Filled 2018-03-22 (×20): qty 5

## 2018-03-22 NOTE — Progress Notes (Signed)
[place] TEAM 1 - Stepdown/ICU TEAM  Kenneth [name]  ZOX:[id number] DOB: May 07, 1956 DOA: 03/09/2018 PCP: [name], MD    Brief Narrative:  62 year old M w/ a Hx of ESRD on HDT/TH/Sat, EtOH abuse, Chronic combined systolic and diastolic heart failure, and HTN who presented with worsening dyspnea w/ only mild respiratory distress initially appreciable in the ED.  He then collapsed while getting up to use the commode, suffering a PEA arrest.  During the code he received empiric treatment for hyperkalemia, shock x1 for WCT, and amiodarone. ROSC after 10 mins of ACLS.   Significant Events:   Subjective: Pt has pulled out his IVs and his feeding tube over the last 24hrs. The patient is currently alert and conversant but very confused.  He can't tell me where he is why he is here or what year this is.  He is experiencing no acute respiratory distress and appears to be comfortable in bed.  She   Assessment & Plan:  Acute hypoxic respiratory failure due to pulmonary edema  Resolved - ?due to HD noncompliance   PEA arrest S/P cooling procedure CPR plus shock x1 with ROSC after 10 minutes - not yet clear if any of his confusion will persist/is reflective of hypoxic injury   Chronic diastolic CHF / Severe LVH Volume management per HD - stable at this time  [facility] Weights   03/20/18 1946 03/21/18 0445 03/22/18 0400  Weight: 84 kg (185 lb 3 oz) 83.9 kg (184 lb 15.5 oz) 84.8 kg (186 lb 15.2 oz)    A. Fib with RVR rate controlled - noncompliant with home anticoagulation  HTN BP reasonably controlled  ESRD on HD T/TH/Sat Care per Nephrology   Dysphagia Has a reported hx of "cognitively based dysphagia" in July 2017 - last eval per SLP was 5/25 at which time suggestion was still for NPO status - keep Cortrak out for now and ask SLP to re-eval speech in AM - hold meds which can't be given IV short term   Anemiaof ESRD Hgb stable/improving  Hx CVA  Altered mental  status Multifactorial post extubation, EtOH abuse, end-stage renal disease, recent cardiac arrest - worrisome for anoxic injury due to arrest - minimize sedating meds as able - montiro   EtOH abuse Now well beyond window of ongoing EtOH withdrawal   DVT prophylaxis: SQ heparin  Code Status: FULL CODE Family Communication: no family present at time of exam  Disposition Plan: SDU  Consultants:    Antimicrobials:  none  Objective: Blood pressure (!) 148/80, pulse 92, temperature 97.6 F (36.4 C), temperature source Oral, resp. rate (!) 22, height 5\' 10"  (1.778 m), weight 84.8 kg (186 lb 15.2 oz), SpO2 99 %.  Intake/Output Summary (Last 24 hours) at 03/22/2018 1527 Last data filed at 03/22/2018 0347 Gross per 24 hour  Intake 254.17 ml  Output 50 ml  Net 204.17 ml   Filed Weights   03/20/18 1946 03/21/18 0445 03/22/18 0400  Weight: 84 kg (185 lb 3 oz) 83.9 kg (184 lb 15.5 oz) 84.8 kg (186 lb 15.2 oz)    Examination: General: No acute respiratory distress Lungs: Clear to auscultation bilaterally without wheezes or crackles Cardiovascular: Regular rate and rhythm without murmur gallop or rub normal S1 and S2 Abdomen: Nontender, nondistended, soft, bowel sounds positive, no rebound, no ascites, no appreciable mass Extremities: No significant cyanosis, clubbing, or edema bilateral lower extremities  CBC: Recent Labs  Lab 03/20/18 0305 03/21/18 0326 03/22/18 0253  WBC 14.1* 17.1*  15.5*  NEUTROABS  --   --  10.8*  HGB 9.3* 10.5* 11.4*  HCT 27.5* 30.9* 34.2*  MCV 98.2 96.6 98.3  PLT 283 330 182   Basic Metabolic Panel: Recent Labs  Lab 03/16/18 0359  03/18/18 1100 03/19/18 0405 03/20/18 0305 03/21/18 0326 03/22/18 0253  NA 129*   < > 137 137 134* 134* 138  K 4.7   < > 3.9 3.3* 3.4* 3.9 3.8  CL 88*   < > 97* 93* 91* 95* 98*  CO2 23   < > 27 32 29 27 26   GLUCOSE 102*   < > 123* 112* 119* 97 82  BUN 78*   < > 69* 33* 60* 32* 54*  CREATININE 12.43*   < > 10.92*  6.35* 8.72* 5.81* 8.44*  CALCIUM 8.6*   < > 8.9 8.5* 8.9 8.9 9.5  MG  --   --   --  2.1  --  2.1 2.5*  PHOS 7.9*  --  6.5*  --   --   --   --    < > = values in this interval not displayed.   GFR: Estimated Creatinine Clearance: 9.1 mL/min (A) (by C-G formula based on SCr of 8.44 mg/dL (H)).  Liver Function Tests: Recent Labs  Lab 03/16/18 0359 03/18/18 1100  ALBUMIN 2.3* 2.2*    Recent Labs  Lab 03/21/18 0326  AMMONIA 25    HbA1C: Hgb A1c MFr Bld  Date/Time Value Ref Range Status  12/08/2013 10:56 PM 5.1 <5.7 % Final    Comment:    (NOTE)                                                                       According to the ADA Clinical Practice Recommendations for 2011, when HbA1c is used as a screening test:  >=6.5%   Diagnostic of Diabetes Mellitus           (if abnormal result is confirmed) 5.7-6.4%   Increased risk of developing Diabetes Mellitus References:Diagnosis and Classification of Diabetes Mellitus,Diabetes XHBZ,1696,78(LFYBO 1):S62-S69 and Standards of Medical Care in         Diabetes - 2011,Diabetes FBPZ,0258,52 (Suppl 1):S11-S61.    CBG: Recent Labs  Lab 03/21/18 2100 03/22/18 0005 03/22/18 0449 03/22/18 0818 03/22/18 1201  GLUCAP 83 83 85 96 86    Scheduled Meds: . aspirin  325 mg Per Tube Daily  . chlorhexidine  15 mL Mouth Rinse BID  . [START ON 03/23/2018] Chlorhexidine Gluconate Cloth  6 each Topical Q0600  . darbepoetin (ARANESP) injection - DIALYSIS  40 mcg Intravenous Q Tue-HD  . diltiazem  60 mg Per Tube Q6H  . feeding supplement (PRO-STAT SUGAR FREE 64)  30 mL Per Tube BID  . folic acid  1 mg Intravenous Daily  . heparin  5,000 Units Subcutaneous Q8H  . levalbuterol  0.63 mg Nebulization TID  . mouth rinse  15 mL Mouth Rinse q12n4p  . metoprolol tartrate  5 mg Intravenous Q6H  . nicotine  21 mg Transdermal Daily  . thiamine injection  100 mg Intravenous Daily     LOS: 13 days   Cherene Altes, MD [facility] Office  [phone] Pager - Text Page  per Amion as per below:  On-Call/Text Page:      [email or web address]      password [email or web address]  If 7PM-7AM, please contact night-coverage [email or web address] Password [email or web address] 03/22/2018, 3:27 PM

## 2018-03-22 NOTE — Plan of Care (Signed)
  Problem: Education: Goal: Knowledge of General Education information will improve Outcome: Not Progressing   Problem: Health Behavior/Discharge Planning: Goal: Ability to manage health-related needs will improve Outcome: Not Progressing   Problem: Clinical Measurements: Goal: Respiratory complications will improve Outcome: Not Progressing   Problem: Coping: Goal: Level of anxiety will decrease Outcome: Not Progressing

## 2018-03-22 NOTE — Progress Notes (Addendum)
Clinical Social Worker acknowledges consult for SNF placement for patient. CSW at this time is unable to assess patient as he is currently having behavior problems (swinging at staff, pulling cortrak and iv lines out).Per MD plan is to replace cortrak. CSW will be unable to place patient at SNF with cortrak in place.  CSW will continue to follow patient for support and discharge needs.   Rhea Pink, MSW,  [place]

## 2018-03-22 NOTE — Progress Notes (Signed)
[facility] Progress Note  Subjective: no c/o in restraints pulled ng out  Vitals:   03/22/18 0400 03/22/18 0800 03/22/18 0850 03/22/18 1212  BP: (!) 165/83 (!) 145/83 (!) 145/83 (!) 148/80  Pulse:  91 94 92  Resp: 18 18 15 18   Temp: 98.1 F (36.7 C) 97.6 F (36.4 C)  97.6 F (36.4 C)  TempSrc: Oral Oral  Oral  SpO2: 98% 99% 98% 99%  Weight: 84.8 kg (186 lb 15.2 oz)     Height:        Inpatient medications: . aspirin  325 mg Per Tube Daily  . chlorhexidine  15 mL Mouth Rinse BID  . darbepoetin (ARANESP) injection - DIALYSIS  40 mcg Intravenous Q Tue-HD  . diltiazem  60 mg Per Tube Q6H  . feeding supplement (PRO-STAT SUGAR FREE 64)  30 mL Per Tube BID  . folic acid  1 mg Intravenous Daily  . heparin  5,000 Units Subcutaneous Q8H  . levalbuterol  0.63 mg Nebulization TID  . mouth rinse  15 mL Mouth Rinse q12n4p  . metoprolol tartrate  5 mg Intravenous Q6H  . nicotine  21 mg Transdermal Daily  . thiamine injection  100 mg Intravenous Daily   . sodium chloride Stopped (03/09/18 2142)  . sodium chloride    . sodium chloride    . dextrose 5 % and 0.9% NaCl 50 mL/hr at 03/21/18 2242  . famotidine (PEPCID) IV Stopped (03/21/18 2316)  . feeding supplement (NEPRO CARB STEADY) 1,000 mL (03/21/18 0332)  . valproate sodium 500 mg (03/22/18 1223)   sodium chloride, sodium chloride, sodium chloride, acetaminophen (TYLENOL) oral liquid 160 mg/5 mL, acetaminophen, alteplase, diltiazem, fentaNYL (SUBLIMAZE) injection, haloperidol lactate, heparin, lactulose, lidocaine (PF), lidocaine-prilocaine, LORazepam, pentafluoroprop-tetrafluoroeth  Exam:  lying in bed responds is partially oriented   no jvd  chest occ rhonchi  cor reg no mrg  abd soft ntnd no ascites  ext no leg edema  lua avf +bruit  nonfocal moves all ext  Dialysis: [facility]  4h 47min  84kg  2/2 bath hep 2400  lua avf  -venofer 50/wk  -mircera 75 every 2 wks  -calc 2.75  -sensipar 90 tiw       Impression: 1 PEA cardiac arrest s/p cooling procedure 2 resp failure/ acute pulm edema - resolved 3 delirium/ agitation - post arrest, more oriented today 4 volume /htn - cont dilt/ metop, at dry wt prob needs lower dry wt 5 anemia ckd - cont darbe weekly Hb 10-11 6 mbd ckd - currently npo   Plan - dialysis tuesday   Kelly Splinter MD [facility] pager [phone]   03/22/2018, 1:19 PM   Recent Labs  Lab 03/16/18 0359  03/18/18 1100  03/20/18 0305 03/21/18 0326 03/22/18 0253  NA 129*   < > 137   < > 134* 134* 138  K 4.7   < > 3.9   < > 3.4* 3.9 3.8  CL 88*   < > 97*   < > 91* 95* 98*  CO2 23   < > 27   < > 29 27 26   GLUCOSE 102*   < > 123*   < > 119* 97 82  BUN 78*   < > 69*   < > 60* 32* 54*  CREATININE 12.43*   < > 10.92*   < > 8.72* 5.81* 8.44*  CALCIUM 8.6*   < > 8.9   < > 8.9 8.9 9.5  PHOS 7.9*  --  6.5*  --   --   --   --    < > = values in this interval not displayed.   Recent Labs  Lab 03/16/18 0359 03/18/18 1100  ALBUMIN 2.3* 2.2*   Recent Labs  Lab 03/20/18 0305 03/21/18 0326 03/22/18 0253  WBC 14.1* 17.1* 15.5*  NEUTROABS  --   --  10.8*  HGB 9.3* 10.5* 11.4*  HCT 27.5* 30.9* 34.2*  MCV 98.2 96.6 98.3  PLT 283 330 343   Iron/TIBC/Ferritin/ %Sat    Component Value Date/Time   IRON 49 03/21/2018 1404   TIBC 207 (L) 03/21/2018 1404   FERRITIN 1,273 (H) 03/21/2018 1404   IRONPCTSAT 24 03/21/2018 1404

## 2018-03-22 NOTE — Evaluation (Signed)
Occupational Therapy Evaluation Patient Details Name: Kenneth [name] MRN: [id number] DOB: 16-Feb-1956 Today's Date: 03/22/2018    History of Present Illness Pt presented with SOB and suffered cardiac arrest in ED and underwent CPR x 10 minutes. Pt s/p hypothermia protocol. Intubated 5/14. Extubated 5/20. PMH - ESRD on HD, chf, pulmonary edema, HTN, ETOH   Clinical Impression   Upon arrival, pt supine in bed, calm, with BUE restraints in place. Per chart review, pt living with his daughter and unsure of PLOF. Pt currently following simple cues for one step grooming tasks to wash his face at bed level. Pt participate in bilateral UE/LE exercises in bed and bed mobility to scoot towards HOB. Pt requiring Max A for bathing/dressing/toileting due to current cognitive and behavioral status. Pt would benefit from further acute OT to facilitate safe dc. Recommend dc to SNF for further OT to optimize safety, independence with ADLs, and return to PLOF.      Follow Up Recommendations  SNF    Equipment Recommendations  Other (comment)(Defer to next venue)    Recommendations for Other Services PT consult     Precautions / Restrictions Precautions Precautions: Fall Precaution Comments: In restraints Restrictions Weight Bearing Restrictions: No      Mobility Bed Mobility Overal bed mobility: Needs Assistance             General bed mobility comments: Min A to scoot towards HOB. With two people present, releasing BUE restraints so pt may push up towards [place]. Pt fatigues quickly dueing bed mobility.  Transfers                 General transfer comment: RN requests to wait on mobility due to agitation    Balance                                           ADL either performed or assessed with clinical judgement   ADL Overall ADL's : Needs assistance/impaired Eating/Feeding: NPO   Grooming: Wash/dry face;Set up;Supervision/safety;Bed level Grooming Details  (indicate cue type and reason): Releasing RUE restrait for pt to wash his face. Pt following simple commands and washing his face at bed level. Pt with decreased attention and stops tasks to place wash cloth on his face. Pt declining to perform oral care.  Upper Body Bathing: Maximal assistance   Lower Body Bathing: Maximal assistance   Upper Body Dressing : Maximal assistance   Lower Body Dressing: Maximal assistance                 General ADL Comments: Pt in UE restraints due to agitation and confusion. Pt performing grooming task in bed with simple cues and calm environment. Pt also partcipating in Bilateral UE/LE excercises at bed level with increased cues. Pt requiring Max A for all ADLs due to cognitive deficits and agitation. Pt pleasant with OT upon eval.     Vision         Perception     Praxis      Pertinent Vitals/Pain Pain Assessment: Faces Faces Pain Scale: No hurt Pain Intervention(s): Monitored during session     Hand Dominance Right   Extremity/Trunk Assessment Upper Extremity Assessment Upper Extremity Assessment: Generalized weakness   Lower Extremity Assessment Lower Extremity Assessment: Generalized weakness   Cervical / Trunk Assessment Cervical / Trunk Assessment: Normal   Communication Communication Communication: Other (comment)(speech mumbled)  Cognition Arousal/Alertness: Awake/alert Behavior During Therapy: Restless Overall Cognitive Status: Impaired/Different from baseline Area of Impairment: Orientation;Attention;Memory;Following commands;Safety/judgement;Awareness;Problem solving                 Orientation Level: Disoriented to;Place;Time;Situation Current Attention Level: Sustained Memory: Decreased short-term memory;Decreased recall of precautions Following Commands: Follows one step commands inconsistently;Follows one step commands with increased time Safety/Judgement: Decreased awareness of safety;Decreased awareness  of deficits Awareness: Intellectual Problem Solving: Slow processing;Decreased initiation;Difficulty sequencing;Requires verbal cues;Requires tactile cues General Comments: Pt initially stating he is in [facility]. With several correctional cues, pt oriented to correct city and state. Pt stating "I don't know how I got it in my head I was in [place]. I musta got too far aware from the school." Pt requiring increased time and cues throughout session. Challenging pt's problem solving to orient to place. Pt answering yes/no questions for "is this a school? is this a church? is this a hoptisal?" Pt able to correctly answer yes and no, and then stating he knows it is a hospital because there are nurses   General Comments  NT present    Exercises Exercises: General Upper Extremity;General Lower Extremity General Exercises - Upper Extremity Shoulder Flexion: AROM;Both;10 reps;Supine General Exercises - Lower Extremity Gluteal Sets: AROM;Both;10 reps   Shoulder Instructions      Home Living Family/patient expects to be discharged to:: Private residence Living Arrangements: Other relatives Available Help at Discharge: Family;Available 24 hours/day(daughter) Type of Home: Apartment Home Access: Level entry     Home Layout: One level     Bathroom Shower/Tub: Occupational psychologist: Standard     Home Equipment: Environmental consultant - 2 wheels   Additional Comments: Information taken from prior admission. Pt confused and unable to verify.      Prior Functioning/Environment Level of Independence: Needs assistance        Comments: Unsure of PLOF due to pt being poor historian. Pt reporting he performed ADLs PTA        OT Problem List: Decreased range of motion;Decreased strength;Decreased activity tolerance;Impaired balance (sitting and/or standing);Decreased cognition;Decreased safety awareness;Decreased knowledge of use of DME or AE;Decreased knowledge of precautions      OT  Treatment/Interventions: Self-care/ADL training;Therapeutic exercise;Energy conservation;DME and/or AE instruction;Therapeutic activities;Patient/family education    OT Goals(Current goals can be found in the care plan section) Acute Rehab OT Goals Patient Stated Goal: Not stated OT Goal Formulation: Patient unable to participate in goal setting Time For Goal Achievement: 04/05/18 Potential to Achieve Goals: Good ADL Goals Pt Will Perform Grooming: sitting;with supervision;with set-up Pt Will Transfer to Toilet: stand pivot transfer;bedside commode;with mod assist Pt Will Perform Toileting - Clothing Manipulation and hygiene: with mod assist;sit to/from stand Additional ADL Goal #1: Pt will sustain attention to three part ADL task with Min cues  OT Frequency: Min 2X/week   Barriers to D/C:            Co-evaluation              AM-PAC PT "6 Clicks" Daily Activity     Outcome Measure Help from another person eating meals?: Total Help from another person taking care of personal grooming?: A Little Help from another person toileting, which includes using toliet, bedpan, or urinal?: A Lot Help from another person bathing (including washing, rinsing, drying)?: A Lot Help from another person to put on and taking off regular upper body clothing?: A Lot Help from another person to put on and taking off regular lower  body clothing?: A Lot 6 Click Score: 12   End of Session Equipment Utilized During Treatment: Other (comment)(Restraints) Nurse Communication: Mobility status(Pt agreeable throughout session)  Activity Tolerance: Patient limited by pain;Treatment limited secondary to agitation Patient left: in bed;with call bell/phone within reach;with nursing/sitter in room;with restraints reapplied  OT Visit Diagnosis: Unsteadiness on feet (R26.81);Other abnormalities of gait and mobility (R26.89);Muscle weakness (generalized) (M62.81);Other symptoms and signs involving cognitive  function                Time: 1050-1111 OT Time Calculation (min): 21 min Charges:  OT General Charges $OT Visit: 1 Visit OT Evaluation $OT Eval Moderate Complexity: 1 Mod G-Codes:     [place], OTR/L Acute Rehab Pager: [phone] Office: [place] 03/22/2018, 1:08 PM

## 2018-03-23 LAB — RENAL FUNCTION PANEL
Albumin: 2.5 g/dL — ABNORMAL LOW (ref 3.5–5.0)
Anion gap: 15 (ref 5–15)
BUN: 79 mg/dL — AB (ref 6–20)
CHLORIDE: 99 mmol/L — AB (ref 101–111)
CO2: 24 mmol/L (ref 22–32)
CREATININE: 11.98 mg/dL — AB (ref 0.61–1.24)
Calcium: 9.1 mg/dL (ref 8.9–10.3)
GFR calc Af Amer: 4 mL/min — ABNORMAL LOW (ref >60)
GFR calc non Af Amer: 4 mL/min — ABNORMAL LOW (ref >60)
Glucose, Bld: 72 mg/dL (ref 65–99)
POTASSIUM: 4.1 mmol/L (ref 3.5–5.1)
Phosphorus: 6 mg/dL — ABNORMAL HIGH (ref 2.5–4.6)
Sodium: 138 mmol/L (ref 135–145)

## 2018-03-23 LAB — GLUCOSE, CAPILLARY
GLUCOSE-CAPILLARY: 78 mg/dL (ref 65–99)
GLUCOSE-CAPILLARY: 77 mg/dL (ref 65–99)
Glucose-Capillary: 74 mg/dL (ref 65–99)
Glucose-Capillary: 83 mg/dL (ref 65–99)
Glucose-Capillary: 85 mg/dL (ref 65–99)
Glucose-Capillary: 90 mg/dL (ref 65–99)

## 2018-03-23 LAB — CBC WITH DIFFERENTIAL/PLATELET
BASOS PCT: 1 %
Basophils Absolute: 0.1 10*3/uL (ref 0.0–0.1)
EOS PCT: 6 %
Eosinophils Absolute: 0.7 10*3/uL (ref 0.0–0.7)
HCT: 29.3 % — ABNORMAL LOW (ref 39.0–52.0)
Hemoglobin: 9.8 g/dL — ABNORMAL LOW (ref 13.0–17.0)
LYMPHS ABS: 1.9 10*3/uL (ref 0.7–4.0)
Lymphocytes Relative: 16 %
MCH: 32.3 pg (ref 26.0–34.0)
MCHC: 33.4 g/dL (ref 30.0–36.0)
MCV: 96.7 fL (ref 78.0–100.0)
MONO ABS: 1.3 10*3/uL — AB (ref 0.1–1.0)
Monocytes Relative: 11 %
NEUTROS ABS: 7.6 10*3/uL (ref 1.7–7.7)
Neutrophils Relative %: 66 %
PLATELETS: 361 10*3/uL (ref 150–400)
RBC: 3.03 MIL/uL — ABNORMAL LOW (ref 4.22–5.81)
RDW: 13.8 % (ref 11.5–15.5)
WBC: 11.6 10*3/uL — ABNORMAL HIGH (ref 4.0–10.5)

## 2018-03-23 MED ORDER — DARBEPOETIN ALFA 40 MCG/0.4ML IJ SOSY
PREFILLED_SYRINGE | INTRAMUSCULAR | Status: AC
  Administered 2018-03-23: 40 ug via INTRAVENOUS
  Filled 2018-03-23: qty 0.4

## 2018-03-23 MED ORDER — LORAZEPAM 2 MG/ML IJ SOLN: 1.0000 mg | Freq: Once | INTRAMUSCULAR | Status: DC

## 2018-03-23 MED ORDER — LEVALBUTEROL HCL 0.63 MG/3ML IN NEBU: 0.6300 mg | INHALATION_SOLUTION | Freq: Four times a day (QID) | RESPIRATORY_TRACT | Status: DC | PRN

## 2018-03-23 MED ORDER — HEPARIN SODIUM (PORCINE) 1000 UNIT/ML DIALYSIS: 2400.0000 [IU] | Freq: Once | INTRAMUSCULAR | Status: DC

## 2018-03-23 NOTE — Progress Notes (Signed)
PT Cancellation Note  Patient Details Name: Kenneth [name] MRN: [id number] DOB: 17-Jan-1956   Cancelled Treatment:    Reason Eval/Treat Not Completed: Patient at procedure or test/unavailable   Shary Decamp [facility] 03/23/2018, 10:06 AM Suanne Marker PT [phone]

## 2018-03-23 NOTE — Progress Notes (Signed)
  Speech Language Pathology Treatment: Dysphagia  Patient Details Name: Kenneth [name] MRN: [id number] DOB: August 07, 1956 Today's Date: 03/23/2018 Time: 2119-4174 SLP Time Calculation (min) (ACUTE ONLY): 9 min  Assessment / Plan / Recommendation Clinical Impression  Pt seen sitting on edge of bed with PT. Mod-max verbal cues to lift head/trunk and able self feed following initial tactile assist. Requests to cough resembled strong throat clear. Pt is dysarthric, vocal intensity low and quality wet following po's.Cognitive component present with pt orally holding liquid and puree boluses (mild-mod). Pt will need objective assessment to fully assess swallow and will plan on tomorrow if pt continues to be appropriate. Continue NPO.   HPI HPI: 62 y/o male admitted on 5/14 after having a cardiac arrest in the setting of a PEA arrest and wide complex tachycardia.  Had 10 minutes of CPR. ETT 5/14-5/20. CT Head showed no acute changes but advanced cerebral and cerebellar atrophy, remote bilateral lacunar infarcts involving the basal ganglia. PMH:  ERSD, sCHF, EtOH abuse, HTN. Pt had a BSE in July 2017 recommending Dys 3 diet and thin liquids by cup due to cognitively-based dysphagia. He was advanced to regular textures and thin liquids by cup or straw prior to d/c.      SLP Plan  MBS       Recommendations  Diet recommendations: NPO Medication Administration: Via alternative means                Oral Care Recommendations: Oral care QID Follow up Recommendations: Skilled Nursing facility SLP Visit Diagnosis: Dysphagia, unspecified (R13.10) Plan: MBS       GO                Houston Siren 03/23/2018, 1:55 PM   Orbie Pyo Andrue Dini M.Ed [employer] [phone]

## 2018-03-23 NOTE — Progress Notes (Signed)
Physical Therapy Treatment Patient Details Name: Kenneth [name] MRN: [id number] DOB: 1956/02/21 Today's Date: 03/23/2018    History of Present Illness Pt presented with SOB and suffered cardiac arrest in ED and underwent CPR x 10 minutes. Pt s/p hypothermia protocol. Intubated 5/14. Extubated 5/20. PMH - ESRD on HD, chf, pulmonary edema, HTN, ETOH    PT Comments    Pt making slow progress. Calm and cooperative during PT. Continue to feel pt will need ST-SNF at [place].    Follow Up Recommendations  SNF     Equipment Recommendations  Other (comment)(TBA at next venue)    Recommendations for Other Services       Precautions / Restrictions Precautions Precautions: Fall Restrictions Weight Bearing Restrictions: No    Mobility  Bed Mobility Overal bed mobility: Needs Assistance Bed Mobility: Supine to Sit;Sit to Supine     Supine to sit: +2 for physical assistance;Mod assist Sit to supine: Mod assist   General bed mobility comments: Assist to bring legs off of bed, elevate trunk into sitting and bring hips to EOB. Assist to bring legs back up into bed.   Transfers Overall transfer level: Needs assistance Equipment used: Rolling walker (2 wheeled) Transfers: Sit to/from Stand Sit to Stand: Mod assist;+2 safety/equipment         General transfer comment: Assist to bring hips up and for balance  Ambulation/Gait Ambulation/Gait assistance: +2 physical assistance;Min assist Ambulation Distance (Feet): 45 Feet Assistive device: Rolling walker (2 wheeled) Gait Pattern/deviations: Step-through pattern;Decreased step length - right;Decreased step length - left;Drifts right/left;Trunk flexed Gait velocity: decr Gait velocity interpretation: <1.8 ft/sec, indicate of risk for recurrent falls General Gait Details: Assist for balance and support. Verbal cues to stand more erect and stay close to walker   Stairs             Wheelchair Mobility    Modified Rankin (Stroke  Patients Only)       Balance Overall balance assessment: Needs assistance Sitting-balance support: Bilateral upper extremity supported;Feet supported Sitting balance-Leahy Scale: Poor Sitting balance - Comments: Pt requiring UE support and min to min guard assist for static sitting. Sat EOB x 10 minutes while participating in swallowing assessment with speech.    Standing balance support: Bilateral upper extremity supported Standing balance-Leahy Scale: Poor Standing balance comment: walker and min assist for static standing                            Cognition Arousal/Alertness: Awake/alert Behavior During Therapy: Flat affect Overall Cognitive Status: Impaired/Different from baseline Area of Impairment: Orientation;Attention;Memory;Following commands;Safety/judgement;Awareness;Problem solving                 Orientation Level: Disoriented to;Place;Time;Situation Current Attention Level: Sustained Memory: Decreased short-term memory;Decreased recall of precautions Following Commands: Follows one step commands with increased time Safety/Judgement: Decreased awareness of safety;Decreased awareness of deficits Awareness: Intellectual Problem Solving: Slow processing;Decreased initiation;Difficulty sequencing;Requires verbal cues;Requires tactile cues        Exercises      General Comments        Pertinent Vitals/Pain Pain Assessment: Faces Faces Pain Scale: Hurts a little bit Pain Location: back Pain Descriptors / Indicators: Grimacing Pain Intervention(s): Limited activity within patient's tolerance;Monitored during session;Repositioned    Home Living                      Prior Function  PT Goals (current goals can now be found in the care plan section) Progress towards PT goals: Progressing toward goals    Frequency    Min 3X/week      PT Plan Current plan remains appropriate    Co-evaluation PT/OT/SLP  Co-Evaluation/Treatment: Yes Reason for Co-Treatment: Complexity of the patient's impairments (multi-system involvement) PT goals addressed during session: Balance;Mobility/safety with mobility        AM-PAC PT "6 Clicks" Daily Activity  Outcome Measure  Difficulty turning over in bed (including adjusting bedclothes, sheets and blankets)?: Unable Difficulty moving from lying on back to sitting on the side of the bed? : Unable Difficulty sitting down on and standing up from a chair with arms (e.g., wheelchair, bedside commode, etc,.)?: Unable Help needed moving to and from a bed to chair (including a wheelchair)?: A Lot Help needed walking in hospital room?: A Lot Help needed climbing 3-5 steps with a railing? : Total 6 Click Score: 8    End of Session Equipment Utilized During Treatment: Gait belt Activity Tolerance: Patient limited by fatigue Patient left: in bed;with call bell/phone within reach;with nursing/sitter in room(sitter present) Nurse Communication: Mobility status PT Visit Diagnosis: Unsteadiness on feet (R26.81);Other abnormalities of gait and mobility (R26.89);History of falling (Z91.81);Muscle weakness (generalized) (M62.81)     Time: 1336-1400 PT Time Calculation (min) (ACUTE ONLY): 24 min  Charges:  $Gait Training: 8-22 mins                    G Codes:       [facility] PT [place] 03/23/2018, 2:24 PM

## 2018-03-23 NOTE — Care Management Important Message (Signed)
Important Message  Patient Details  Name: Kenneth [name] MRN: [id number] Date of Birth: [birth year]   Medicare Important Message Given:  Yes    Barb Merino Devota Viruet 03/23/2018, 3:05 PM

## 2018-03-23 NOTE — Progress Notes (Signed)
[facility] Progress Note  Subjective: no c/o on dialysis wants to "go home"  Vitals:   03/23/18 0810 03/23/18 0840 03/23/18 0900 03/23/18 0930  BP: (!) 166/89 (!) 145/65 133/76 134/77  Pulse: 78 81 76 74  Resp: 19     Temp:      TempSrc:      SpO2:      Weight:      Height:        Inpatient medications: . chlorhexidine  15 mL Mouth Rinse BID  . Chlorhexidine Gluconate Cloth  6 each Topical Q0600  . darbepoetin (ARANESP) injection - DIALYSIS  40 mcg Intravenous Q Tue-HD  . folic acid  1 mg Intravenous Daily  . [START ON 03/24/2018] heparin  2,400 Units Dialysis Once in dialysis  . heparin  5,000 Units Subcutaneous Q8H  . LORazepam  1 mg Intravenous Once  . mouth rinse  15 mL Mouth Rinse q12n4p  . metoprolol tartrate  5 mg Intravenous Q6H  . nicotine  21 mg Transdermal Daily  . thiamine injection  100 mg Intravenous Daily   . dextrose 5 % and 0.9% NaCl 30 mL/hr at 03/22/18 1719  . famotidine (PEPCID) IV Stopped (03/22/18 2208)  . valproate sodium Stopped (03/22/18 2242)   acetaminophen, alteplase, diltiazem, fentaNYL (SUBLIMAZE) injection, haloperidol lactate, heparin, levalbuterol, lidocaine (PF), lidocaine-prilocaine, pentafluoroprop-tetrafluoroeth  Exam:  lying in bed responds is partially oriented   no jvd  chest occ rhonchi  cor reg no mrg  abd soft ntnd no ascites  ext no leg edema  lua avf +bruit  nonfocal moves all ext   cxr 5/26 no edema  Dialysis: tts [facility]  4h 85min  84kg  2/2 bath hep 2400  lua avf  -venofer 50/wk  -mircera 75 every 2 wks  -calc 2.75  -sensipar 90 tiw      Impression: 1 PEA cardiac arrest s/p cooling procedure 2 resp failure/ acute pulm edema - due to nonadherence, resolved 3 delirium/ agitation - post arrest, able to converse now 4 volume /htn - cont dilt/ metop, at dry wt prob needs lower dry wt 5 anemia ckd - cont darbe weekly Hb 10-11 6 mbd ckd - currently npo   Plan - dialysis today   Kelly Splinter  MD [facility] pager [phone]   03/23/2018, 11:14 AM   Recent Labs  Lab 03/18/18 1100  03/21/18 0326 03/22/18 0253 03/23/18 0838  NA 137   < > 134* 138 138  K 3.9   < > 3.9 3.8 4.1  CL 97*   < > 95* 98* 99*  CO2 27   < > 27 26 24   GLUCOSE 123*   < > 97 82 72  BUN 69*   < > 32* 54* 79*  CREATININE 10.92*   < > 5.81* 8.44* 11.98*  CALCIUM 8.9   < > 8.9 9.5 9.1  PHOS 6.5*  --   --   --  6.0*   < > = values in this interval not displayed.   Recent Labs  Lab 03/18/18 1100 03/23/18 0838  ALBUMIN 2.2* 2.5*   Recent Labs  Lab 03/21/18 0326 03/22/18 0253 03/23/18 0836  WBC 17.1* 15.5* 11.6*  NEUTROABS  --  10.8* 7.6  HGB 10.5* 11.4* 9.8*  HCT 30.9* 34.2* 29.3*  MCV 96.6 98.3 96.7  PLT 330 343 361   Iron/TIBC/Ferritin/ %Sat    Component Value Date/Time   IRON 49 03/21/2018 1404   TIBC 207 (L) 03/21/2018 1404  FERRITIN 1,273 (H) 03/21/2018 1404   IRONPCTSAT 24 03/21/2018 1404

## 2018-03-23 NOTE — Progress Notes (Signed)
[place] TEAM 1 - Stepdown/ICU TEAM  Kenneth [name]  YCX:[id number] DOB: [birth year] DOA: 03/09/2018 PCP: [name], MD    Brief Narrative:  62 year old M w/ a Hx of ESRD on HDT/TH/Sat, EtOH abuse, Chronic combined systolic and diastolic heart failure, and HTN who presented with worsening dyspnea w/ only mild respiratory distress initially appreciable in the ED.  He then collapsed while getting up to use the commode, suffering a PEA arrest.  During the code he received empiric treatment for hyperkalemia, shock x1 for WCT, and amiodarone. ROSC after 10 mins of ACLS.   Subjective: The patient is alert and interactive today.  He is not oriented and at times his speech is unintelligible.  He cannot provide a reliable history.  He continues to be impulsive and has required a sitter at bedside to prevent him from attempting to get out of bed.  He does not appear to be in any acute respiratory distress and there is no evidence of uncontrolled pain at the time of my exam.  Assessment & Plan:  Acute hypoxic respiratory failure due to pulmonary edema  Resolved - appears to have been due to HD noncompliance - sats stable on RA   PEA arrest S/P cooling procedure CPR plus shock x1 with ROSC after 10 minutes - not yet clear if any of his confusion will persist/is reflective of hypoxic injury, but I am becoming more concerned this may be his new baseline state   Altered mental status Multifactorial:  post extubation, EtOH abuse, end-stage renal disease, recent cardiac arrest - worrisome for anoxic injury due to arrest - minimize sedating meds as able - I am becoming more concerned this may be his new baseline state - he will clearly need SNF placement  Chronic diastolic CHF / Severe LVH Volume management per HD - stable at this time  [facility] Weights   03/22/18 0400 03/23/18 0433 03/23/18 0730  Weight: 84.8 kg (186 lb 15.2 oz) 82.3 kg (181 lb 7 oz) 82.2 kg (181 lb 3.5 oz)    A. Fib with RVR rate  controlled - noncompliant with home anticoagulation - very high fall risk at this time therefore not initiating anticoag   HTN BP well controlled post HD  ESRD on HD T/TH/Sat Care per Nephrology   Dysphagia Has a reported hx of "cognitively based dysphagia" in July 2017 - last eval per SLP was 5/25 at which time suggestion was still for NPO status - keep Cortrak out for now - SLP to perform objective study 5/29, but pt will likely not do well and will require Cortak for now then consideration for PEG   Anemiaof ESRD Hgb stable/improving  Hx CVA  EtOH abuse Now well beyond window of ongoing EtOH withdrawal   DVT prophylaxis: SQ heparin  Code Status: FULL CODE Family Communication: no family present at time of exam  Disposition Plan: change status to tele bed - begin SNF search - f/u SLP eval 5/29  Consultants:  None active presently   Antimicrobials:  none  Objective: Blood pressure 134/77, pulse 74, temperature 98.4 F (36.9 C), temperature source Oral, resp. rate 19, height 5\' 10"  (1.778 m), weight 82.2 kg (181 lb 3.5 oz), SpO2 98 %.  Intake/Output Summary (Last 24 hours) at 03/23/2018 1100 Last data filed at 03/22/2018 2255 Gross per 24 hour  Intake -  Output 60 ml  Net -60 ml   Filed Weights   03/22/18 0400 03/23/18 0433 03/23/18 0730  Weight: 84.8 kg (186 lb  15.2 oz) 82.3 kg (181 lb 7 oz) 82.2 kg (181 lb 3.5 oz)    Examination: General: No acute respiratory distress - confused  Lungs: Clear to auscultation bilaterally - no wheezinng Cardiovascular: Regular rate without murmur gallop or rub  Abdomen: Nontender, nondistended, soft, bowel sounds positive, no rebound, no ascites, no appreciable mass Extremities: No signif edema B LE   CBC: Recent Labs  Lab 03/21/18 0326 03/22/18 0253 03/23/18 0836  WBC 17.1* 15.5* 11.6*  NEUTROABS  --  10.8* 7.6  HGB 10.5* 11.4* 9.8*  HCT 30.9* 34.2* 29.3*  MCV 96.6 98.3 96.7  PLT 330 343 001   Basic Metabolic  Panel: Recent Labs  Lab 03/18/18 1100 03/19/18 0405  03/21/18 0326 03/22/18 0253 03/23/18 0838  NA 137 137   < > 134* 138 138  K 3.9 3.3*   < > 3.9 3.8 4.1  CL 97* 93*   < > 95* 98* 99*  CO2 27 32   < > 27 26 24   GLUCOSE 123* 112*   < > 97 82 72  BUN 69* 33*   < > 32* 54* 79*  CREATININE 10.92* 6.35*   < > 5.81* 8.44* 11.98*  CALCIUM 8.9 8.5*   < > 8.9 9.5 9.1  MG  --  2.1  --  2.1 2.5*  --   PHOS 6.5*  --   --   --   --  6.0*   < > = values in this interval not displayed.   GFR: Estimated Creatinine Clearance: 6.4 mL/min (A) (by C-G formula based on SCr of 11.98 mg/dL (H)).  Liver Function Tests: Recent Labs  Lab 03/18/18 1100 03/23/18 0838  ALBUMIN 2.2* 2.5*    Recent Labs  Lab 03/21/18 0326  AMMONIA 25    HbA1C: Hgb A1c MFr Bld  Date/Time Value Ref Range Status  12/08/2013 10:56 PM 5.1 <5.7 % Final    Comment:    (NOTE)                                                                       According to the ADA Clinical Practice Recommendations for 2011, when HbA1c is used as a screening test:  >=6.5%   Diagnostic of Diabetes Mellitus           (if abnormal result is confirmed) 5.7-6.4%   Increased risk of developing Diabetes Mellitus References:Diagnosis and Classification of Diabetes Mellitus,Diabetes VCBS,4967,59(FMBWG 1):S62-S69 and Standards of Medical Care in         Diabetes - 2011,Diabetes YKZL,9357,01 (Suppl 1):S11-S61.    CBG: Recent Labs  Lab 03/22/18 1201 03/22/18 1610 03/22/18 2359 03/23/18 0347 03/23/18 0832  GLUCAP 86 86 74 83 85    Scheduled Meds: . chlorhexidine  15 mL Mouth Rinse BID  . Chlorhexidine Gluconate Cloth  6 each Topical Q0600  . darbepoetin (ARANESP) injection - DIALYSIS  40 mcg Intravenous Q Tue-HD  . folic acid  1 mg Intravenous Daily  . [START ON 03/24/2018] heparin  2,400 Units Dialysis Once in dialysis  . heparin  5,000 Units Subcutaneous Q8H  . LORazepam  1 mg Intravenous Once  . mouth rinse  15 mL Mouth  Rinse q12n4p  . metoprolol tartrate  5 mg Intravenous  Q6H  . nicotine  21 mg Transdermal Daily  . thiamine injection  100 mg Intravenous Daily     LOS: 14 days   Cherene Altes, MD [facility] Office  [phone] Pager - Text Page per Amion as per below:  On-Call/Text Page:      [email or web address]      password [email or web address]  If 7PM-7AM, please contact night-coverage [email or web address] Password [email or web address] 03/23/2018, 11:00 AM

## 2018-03-24 ENCOUNTER — Inpatient Hospital Stay (HOSPITAL_COMMUNITY): Payer: Medicare Other

## 2018-03-24 LAB — GLUCOSE, CAPILLARY
GLUCOSE-CAPILLARY: 89 mg/dL (ref 65–99)
GLUCOSE-CAPILLARY: 91 mg/dL (ref 65–99)
Glucose-Capillary: 88 mg/dL (ref 65–99)
Glucose-Capillary: 78 mg/dL (ref 65–99)
Glucose-Capillary: 104 mg/dL — ABNORMAL HIGH (ref 65–99)
Glucose-Capillary: 98 mg/dL (ref 65–99)

## 2018-03-24 MED ORDER — CHLORHEXIDINE GLUCONATE CLOTH 2 % EX PADS
6.0000 | MEDICATED_PAD | Freq: Every day | CUTANEOUS | Status: DC
  Administered 2018-03-25 – 2018-03-26 (×2): 6 via TOPICAL

## 2018-03-24 MED ORDER — NEPRO/CARBSTEADY PO LIQD
237.0000 mL | Freq: Two times a day (BID) | ORAL | Status: DC
  Administered 2018-03-27: 237 mL via ORAL

## 2018-03-24 MED ORDER — RESOURCE THICKENUP CLEAR PO POWD
ORAL | Status: DC | PRN
  Filled 2018-03-24: qty 125

## 2018-03-24 NOTE — Progress Notes (Signed)
PROGRESS NOTE  Kenneth [name]  NWG:[id number] DOB: Mar 03, 1956 DOA: 03/09/2018 PCP: Kenneth Ruddy, MD   Brief Narrative: 74 year oldMw/ a Hxof ESRD on HDT/TH/Sat,EtOH abuse, Chronic combined systolic and diastolic heart failure, and HTN who presented with worsening dyspnea w/ only mild respiratory distress initially appreciable in the ED.  He then collapsed while getting up to use the commode, suffering a PEA arrest.  During the code he received empiric treatment for hyperkalemia, shock x1 for WCT, and amiodarone. ROSC after 10 mins of ACLS followed by targeted temperature management. Following this he has continued to be encephalopathic and had AFib with RVR, since reverted to NSR.  Assessment & Plan: Active Problems:   Cardiac arrest ([place])   Acute encephalopathy  Altered mental status:  - Continue telesitter, soft restraints if needed.  - Monitor for improvement, as this may be acute delirium and/or sequelae of anoxic brain injury.  Dysphagia: Possibly related to encephalopathy. Repeat MBS 5/29 showed mod-severe oral and pharyngeal dysphagia - Dysphagia diet ordered. Only to be fed when alert. Honey thickened liquids via tsp only.  Acute hypoxic respiratory failure: Due to pulmonary edema due to noncompliance with HD.  - Resolved.  Cardiac arrest: x10 min prior to ROSC, TTM.   ESRD:  - Continue HD per nephrology  Chronic diastolic CHF, HTN: With severe LVH - BP/volume management with HD  AFib with RVR: Resolved.  - Not candidate for anticoagulation due to chronic nonadherence.  - Rate is controlled  EtOH abuse: No further need for CIWA  Anemia of chronic disease: Stable.  - Monitor  DVT prophylaxis: Heparin Code Status: Full Family Communication: None at bedside Disposition Plan: SNF  Consultants:   None currently  Subjective: Muttering answers, no complaints, denies pain. No events reported overnight. Has not eaten yet.  Objective: Vitals:   03/24/18 0000  03/24/18 0511 03/24/18 0817 03/24/18 1317  BP: 136/72  114/72 (!) 118/53  Pulse: 82  79 69  Resp:   14 16  Temp:      TempSrc:      SpO2:   94% 96%  Weight:  81.1 kg (178 lb 12.7 oz)    Height:        Intake/Output Summary (Last 24 hours) at 03/24/2018 1714 Last data filed at 03/24/2018 1339 Gross per 24 hour  Intake 0 ml  Output -  Net 0 ml   Filed Weights   03/23/18 0730 03/23/18 1145 03/24/18 0511  Weight: 82.2 kg (181 lb 3.5 oz) 80 kg (176 lb 5.9 oz) 81.1 kg (178 lb 12.7 oz)    Gen: Confused but not agitated in no distress Pulm: Non-labored breathing. Clear to auscultation bilaterally.  CV: Regular rate and rhythm. No murmur, rub, or gallop. No JVD, no significant pedal edema. GI: Abdomen soft, non-tender, non-distended, with normoactive bowel sounds. No organomegaly or masses felt. Ext: Warm, no deformities Skin: No rashes, lesions or ulcers Neuro: Unable to determine orientation. No focal neurological deficits on limited exam. Psych: Judgement and insight appear impaired   Data Reviewed: I have personally reviewed following labs and imaging studies  CBC: Recent Labs  Lab 03/18/18 1100 03/20/18 0305 03/21/18 0326 03/22/18 0253 03/23/18 0836  WBC 10.5 14.1* 17.1* 15.5* 11.6*  NEUTROABS  --   --   --  10.8* 7.6  HGB 9.0* 9.3* 10.5* 11.4* 9.8*  HCT 26.6* 27.5* 30.9* 34.2* 29.3*  MCV 96.4 98.2 96.6 98.3 96.7  PLT 252 283 330 343 578   Basic Metabolic Panel: Recent Labs  Lab 03/18/18 1100 03/19/18 0405 03/20/18 0305 03/21/18 0326 03/22/18 0253 03/23/18 0838  NA 137 137 134* 134* 138 138  K 3.9 3.3* 3.4* 3.9 3.8 4.1  CL 97* 93* 91* 95* 98* 99*  CO2 27 32 29 27 26 24   GLUCOSE 123* 112* 119* 97 82 72  BUN 69* 33* 60* 32* 54* 79*  CREATININE 10.92* 6.35* 8.72* 5.81* 8.44* 11.98*  CALCIUM 8.9 8.5* 8.9 8.9 9.5 9.1  MG  --  2.1  --  2.1 2.5*  --   PHOS 6.5*  --   --   --   --  6.0*   GFR: Estimated Creatinine Clearance: 6.4 mL/min (A) (by C-G formula based  on SCr of 11.98 mg/dL (H)). Liver Function Tests: Recent Labs  Lab 03/18/18 1100 03/23/18 0838  ALBUMIN 2.2* 2.5*   No results for input(s): LIPASE, AMYLASE in the last 168 hours. Recent Labs  Lab 03/21/18 0326  AMMONIA 25   Coagulation Profile: No results for input(s): INR, PROTIME in the last 168 hours. Cardiac Enzymes: No results for input(s): CKTOTAL, CKMB, CKMBINDEX, TROPONINI in the last 168 hours. BNP (last 3 results) No results for input(s): PROBNP in the last 8760 hours. HbA1C: No results for input(s): HGBA1C in the last 72 hours. CBG: Recent Labs  Lab 03/24/18 0018 03/24/18 0424 03/24/18 0815 03/24/18 1203 03/24/18 1656  GLUCAP 89 88 91 78 104*   Lipid Profile: No results for input(s): CHOL, HDL, LDLCALC, TRIG, CHOLHDL, LDLDIRECT in the last 72 hours. Thyroid Function Tests: No results for input(s): TSH, T4TOTAL, FREET4, T3FREE, THYROIDAB in the last 72 hours. Anemia Panel: No results for input(s): VITAMINB12, FOLATE, FERRITIN, TIBC, IRON, RETICCTPCT in the last 72 hours. Urine analysis:    Component Value Date/Time   COLORURINE YELLOW 05/12/2016 0834   APPEARANCEUR CLOUDY (A) 05/12/2016 0834   LABSPEC 1.014 05/12/2016 0834   PHURINE 5.0 05/12/2016 0834   GLUCOSEU NEGATIVE 05/12/2016 0834   HGBUR MODERATE (A) 05/12/2016 0834   BILIRUBINUR NEGATIVE 05/12/2016 0834   KETONESUR NEGATIVE 05/12/2016 0834   PROTEINUR 100 (A) 05/12/2016 0834   UROBILINOGEN 0.2 12/09/2013 0013   NITRITE NEGATIVE 05/12/2016 0834   LEUKOCYTESUR NEGATIVE 05/12/2016 0834   No results found for this or any previous visit (from the past 240 hour(s)).    Radiology Studies: Dg Swallowing Func-speech Pathology  Result Date: 03/24/2018 Objective Swallowing Evaluation: Type of Study: MBS-Modified Barium Swallow Study  Patient Details Name: Kenneth [name] MRN: [id number] Date of Birth: 1956-05-19 Today's Date: 03/24/2018 Time: SLP Start Time (ACUTE ONLY): 1015 -SLP Stop Time (ACUTE ONLY):  1035 SLP Time Calculation (min) (ACUTE ONLY): 20 min Past Medical History: Past Medical History: Diagnosis Date . Acute CHF ([place]) 01/2018 . Cardiomyopathy secondary   likely related to HTN heart disease; possibly ETOH related as well . Chronic combined systolic and diastolic heart failure (HCC)   Echocardiogram 09/22/11: Moderate LVH, EF 13-24%, grade 3 diastolic dysfunction, mild MR, moderate to severe LAE, mild RVE, mild to moderate TR, small to moderate pericardial effusion . ESRD (end stage renal disease) on dialysis [facility])   due to hypertensive nephrosclerosis . History of alcohol abuse  . Hypertension  Past Surgical History: Past Surgical History: Procedure Laterality Date . AV FISTULA PLACEMENT Left 05/14/2016  Procedure: LEFT ARM BASILIC VEIN TRANSPOSITION;  Surgeon: Rosetta Posner, MD;  Location: [facility];  Service: Vascular;  Laterality: Left; . PERIPHERAL VASCULAR CATHETERIZATION N/A 05/13/2016  Procedure: Dialysis/Perma Catheter Insertion;  Surgeon: Serafina Mitchell, MD;  Location: [facility];  Service: Cardiovascular;  Laterality: N/A; HPI: 62 y/o male admitted on 5/14 after having a cardiac arrest in the setting of a PEA arrest and wide complex tachycardia.  Had 10 minutes of CPR. ETT 5/14-5/20. CT Head showed no acute changes but advanced cerebral and cerebellar atrophy, remote bilateral lacunar infarcts involving the basal ganglia. PMH:  ERSD, sCHF, EtOH abuse, HTN. Pt had a BSE in July 2017 recommending Dys 3 diet and thin liquids by cup due to cognitively-based dysphagia. He was advanced to regular textures and thin liquids by cup or straw prior to d/c.  CXR negative.  MBS indicated - pt has pulled out his feeding tube and is alert for eval per RN.   Subjective: Pt seen in radiology for MBS Assessment / Plan / Recommendation CHL IP CLINICAL IMPRESSIONS 03/24/2018 Clinical Impression Pt presents with lethargy, requiring almost constant stimulation with cold wet cloth to maintain alertness. Pt exhibits  moderate-severe oral and pharyngeal dysphagia, characterized as follows: ORALLY, pt demonstrates no bolus formation and poor propulsion on all consistencies; rather, boluses are noted to spill prematurely over the tongue base to the level of the vallecula where the PHARYNGEAL swallow reflex is delayed. Vallecular residue is noted after the swallow, due to decreased tongue base retraction. Trace penetration is noted during the swallow on nectar and honey thick liquids, however, penetrate is not aspirated. There was no cough or throat clear noted in response to penetration event. Solids requiring mastication were not given during this evaluation, due to poor bolus manipulation seen on other consistencies. At this time, recommend  puree solids with honey thick liquids VIA TSP only. PO intake should be given ONLY WHEN PT IS FULLY ALERT, upright, and appropriate for puree and honey thick liquid trials. Full supervision is recommended to insure pt has fully cleared oral cavity prior to taking next bolus. Safe swallow precautions were sent back to pt room with transport. SLP will follow up at bedside to assess diet tolerance and provide education regarding safe swallow precautions, and the importance of adherence to them. SLP Visit Diagnosis Dysphagia, oropharyngeal phase (R13.12) Impact on safety and function Moderate aspiration risk;Severe aspiration risk;Risk for inadequate nutrition/hydration   CHL IP TREATMENT RECOMMENDATION 03/24/2018 Treatment Recommendations Therapy as outlined in treatment plan below   Prognosis 03/24/2018 Prognosis for Safe Diet Advancement Fair Barriers to Reach Goals Cognitive deficits CHL IP DIET RECOMMENDATION 03/24/2018 SLP Diet Recommendations Dysphagia 1 (Puree) solids;Honey thick liquids Liquid Administration via Spoon Medication Administration Crushed with puree Compensations Minimize environmental distractions;Small sips/bites;Slow rate Postural Changes Remain semi-upright after after  feeds/meals (Comment);Seated upright at 90 degrees   CHL IP OTHER RECOMMENDATIONS 03/24/2018 Oral Care Recommendations Oral care QID Other Recommendations Have oral suction available   CHL IP FOLLOW UP RECOMMENDATIONS 03/24/2018 Follow up Recommendations Skilled Nursing facility   [facility] IP FREQUENCY AND DURATION 03/24/2018 Speech Therapy Frequency (ACUTE ONLY) min 2x/week Treatment Duration 1 week;2 weeks      CHL IP ORAL PHASE 03/24/2018 Oral Phase Impaired Oral - Honey Teaspoon Reduced posterior propulsion;Decreased bolus cohesion;Delayed oral transit;Weak lingual manipulation;Holding of bolus;Premature spillage;Piecemeal swallowing Oral - Honey Cup/straw Weak lingual manipulation;Delayed oral transit;Holding of bolus;Reduced posterior propulsion;Decreased bolus cohesion;Premature spillage;Piecemeal swallowing Oral - Nectar Teaspoon Reduced posterior propulsion;Holding of bolus;Weak lingual manipulation;Delayed oral transit;Decreased bolus cohesion;Piecemeal swallowing Oral - Puree Piecemeal swallowing;Weak lingual manipulation;Reduced posterior propulsion;Holding of bolus;Delayed oral transit;Decreased bolus cohesion;Premature spillage  CHL IP PHARYNGEAL PHASE 03/24/2018 Pharyngeal Phase Impaired Pharyngeal- Honey Cup Delayed swallow initiation-vallecula;Pharyngeal residue -  valleculae;Penetration/Aspiration during swallow;Reduced airway/laryngeal closure;Reduced tongue base retraction Pharyngeal Material enters airway, remains ABOVE vocal cords then ejected out;Material does not enter airway Pharyngeal- Nectar Teaspoon Delayed swallow initiation-vallecula;Pharyngeal residue - valleculae;Penetration/Aspiration during swallow;Reduced airway/laryngeal closure;Reduced tongue base retraction Pharyngeal Material enters airway, remains ABOVE vocal cords and not ejected out Pharyngeal- Puree Delayed swallow initiation-vallecula;Pharyngeal residue - valleculae;Reduced tongue base retraction  CHL IP CERVICAL ESOPHAGEAL PHASE  03/24/2018 Cervical Esophageal Phase [facility] Celia B. Quentin Ore [facility] [place] Speech Language Pathologist [phone] Shonna Chock 03/24/2018, 11:38 AM               Scheduled Meds: . chlorhexidine  15 mL Mouth Rinse BID  . [START ON 03/25/2018] Chlorhexidine Gluconate Cloth  6 each Topical Q0600  . darbepoetin (ARANESP) injection - DIALYSIS  40 mcg Intravenous Q Tue-HD  . [START ON 03/25/2018] feeding supplement (NEPRO CARB STEADY)  237 mL Oral BID BM  . folic acid  1 mg Intravenous Daily  . mouth rinse  15 mL Mouth Rinse q12n4p  . metoprolol tartrate  5 mg Intravenous Q6H  . nicotine  21 mg Transdermal Daily  . thiamine injection  100 mg Intravenous Daily   Continuous Infusions: . dextrose 5 % and 0.9% NaCl 30 mL/hr at 03/22/18 1719  . famotidine (PEPCID) IV Stopped (03/23/18 2127)  . valproate sodium Stopped (03/24/18 1153)     LOS: 15 days   Time spent: 25 minutes.  Patrecia Pour, MD [facility] [email or web address] Password [facility] 03/24/2018, 5:14 PM

## 2018-03-24 NOTE — Progress Notes (Addendum)
Nutrition Follow-up  INTERVENTION:    Nepro Shake po BID, each supplement provides 425 kcal and 19 grams protein  NUTRITION DIAGNOSIS:   Increased nutrient needs related to (ESRD on HD) as evidenced by estimated needs, ongoing  GOAL:   Patient will meet greater than or equal to 90% of their needs, progressing  MONITOR:   PO intake, Supplement acceptance, Labs, Skin, Weight trends  ASSESSMENT:   Pt with PMH of ETOH abuse, HTN, CHF, ESRD on HD TTS admitted with SOB and had a PEA arrest in ED. Pt started on 36 degree protocol.   5/20 extubated 5/21 failed swallow eval  5/22 Cortrak placed, tip post pyloric  Pt pulled out his Cortrak feeding tube 5/26. S/p MBSS today. Diet advanced to Dys 1-thin liquids. PO intake 90% per flowsheets at lunch today. Labs and medications reviewed. CBG's [id number].  Diet Order:   Diet Order           DIET - DYS 1 Room service appropriate? Yes; Fluid consistency: Thin  Diet effective now         EDUCATION NEEDS:   No education needs have been identified at this time  Skin:  Skin Assessment: Reviewed RN Assessment  Last BM:  5/28  Height:   Ht Readings from Last 1 Encounters:  03/20/18 5\' 10"  (1.778 m)   Weight:   Wt Readings from Last 1 Encounters:  03/24/18 178 lb 12.7 oz (81.1 kg)   Ideal Body Weight:  78.1 kg  BMI:  Body mass index is 25.65 kg/m.  Estimated Nutritional Needs:   Kcal:  2100-2300  Protein:  106-124  Fluid:  1.2 L/day  Arthur Holms, RD, LDN Pager #: [phone] After-Hours Pager #: [phone]

## 2018-03-24 NOTE — Consult Note (Signed)
Modified Barium Swallow Progress Note  Patient Details  Name: Kenneth [name] MRN: [id number] Date of Birth: April 01, 1956  Today's Date: 03/24/2018  Modified Barium Swallow completed.  Full report located under Chart Review in the Imaging Section.  Brief recommendations include the following:  Clinical Impression Pt presents with lethargy, requiring almost constant stimulation with cold wet cloth to maintain alertness.   Pt exhibits moderate-severe oral and pharyngeal dysphagia, characterized as follows: ORALLY, pt demonstrates no bolus formation and poor propulsion on all consistencies; rather, boluses are noted to spill prematurely over the tongue base to the level of the vallecula where the PHARYNGEAL swallow reflex is delayed. Vallecular residue is noted after the swallow, due to decreased tongue base retraction. Trace penetration is noted during the swallow on nectar and honey thick liquids, however, penetrate is not aspirated. There was no cough or throat clear noted in response to penetration event. Solids requiring mastication were not given during this evaluation, due to poor bolus manipulation seen on other consistencies.  At this time, recommend  puree solids with honey thick liquids VIA TSP only. PO intake should be given ONLY WHEN PT IS FULLY ALERT, upright, and appropriate for puree and honey thick liquid trials. Full supervision is recommended to insure pt has fully cleared oral cavity prior to taking next bolus. Safe swallow precautions were sent back to pt room with transport.   SLP will follow up at bedside to assess diet tolerance and provide education regarding safe swallow precautions, and the importance of adherence to them.   Swallow Evaluation Recommendations  SLP Diet Recommendations: Dysphagia 1 (Puree) solids;Honey thick liquids via teaspoon only   Liquid Administration via: Spoon   Medication Administration: Crushed with puree   Supervision: Full supervision/cueing  for compensatory strategies;Full assist for feeding   Compensations: Minimize environmental distractions;Small sips/bites;Slow rate   Postural Changes: Remain semi-upright after after feeds/meals (Comment);Seated upright at 90 degrees   Oral Care Recommendations: Oral care QID   Other Recommendations: Have oral suction available  Celia B. Quentin Ore [facility], [place] Speech Language Pathologist [phone]  Shonna Chock 03/24/2018,11:43 AM

## 2018-03-24 NOTE — Progress Notes (Signed)
[facility] Progress Note  Subjective: no c/o today, had ba swallow and now written for dysphagia diet  Vitals:   03/23/18 2000 03/24/18 0000 03/24/18 0511 03/24/18 0817  BP: (!) 136/108 136/72  114/72  Pulse: 86 82  79  Resp:    14  Temp:      TempSrc:      SpO2:    94%  Weight:   81.1 kg (178 lb 12.7 oz)   Height:        Inpatient medications: . chlorhexidine  15 mL Mouth Rinse BID  . Chlorhexidine Gluconate Cloth  6 each Topical Q0600  . darbepoetin (ARANESP) injection - DIALYSIS  40 mcg Intravenous Q Tue-HD  . folic acid  1 mg Intravenous Daily  . heparin  2,400 Units Dialysis Once in dialysis  . heparin  5,000 Units Subcutaneous Q8H  . mouth rinse  15 mL Mouth Rinse q12n4p  . metoprolol tartrate  5 mg Intravenous Q6H  . nicotine  21 mg Transdermal Daily  . thiamine injection  100 mg Intravenous Daily   . dextrose 5 % and 0.9% NaCl 30 mL/hr at 03/22/18 1719  . famotidine (PEPCID) IV Stopped (03/23/18 2127)  . valproate sodium 500 mg (03/24/18 1053)   acetaminophen, alteplase, diltiazem, fentaNYL (SUBLIMAZE) injection, haloperidol lactate, heparin, levalbuterol, lidocaine (PF), lidocaine-prilocaine, pentafluoroprop-tetrafluoroeth  Exam:  lying in bed partially oriented   no jvd  chest occ rhonchi  cor reg no mrg  abd soft ntnd no ascites  ext no leg edema  lua avf +bruit  nonfocal moves all ext   cxr 5/26 no edema  Dialysis: tts [facility]  4h 22min  84kg  2/2 bath hep 2400  lua avf  -venofer 50/wk  -mircera 75 every 2 wks  -calc 2.75  -sensipar 90 tiw      Impression: 1 PEA cardiac arrest s/p cooling procedure 2 resp failure/ acute pulm edema - due to nonadherence, resolved 3 delirium/ agitation - post arrest, able to converse now 4 volume /htn - cont metop, under edw now stable vol on exam 5 anemia ckd - cont darbe weekly Hb 10-11 6 mbd ckd - currently npo   Plan - dialysis thursday   Kelly Splinter MD [facility] pager  [phone]   03/24/2018, 12:05 PM   Recent Labs  Lab 03/18/18 1100  03/21/18 0326 03/22/18 0253 03/23/18 0838  NA 137   < > 134* 138 138  K 3.9   < > 3.9 3.8 4.1  CL 97*   < > 95* 98* 99*  CO2 27   < > 27 26 24   GLUCOSE 123*   < > 97 82 72  BUN 69*   < > 32* 54* 79*  CREATININE 10.92*   < > 5.81* 8.44* 11.98*  CALCIUM 8.9   < > 8.9 9.5 9.1  PHOS 6.5*  --   --   --  6.0*   < > = values in this interval not displayed.   Recent Labs  Lab 03/18/18 1100 03/23/18 0838  ALBUMIN 2.2* 2.5*   Recent Labs  Lab 03/21/18 0326 03/22/18 0253 03/23/18 0836  WBC 17.1* 15.5* 11.6*  NEUTROABS  --  10.8* 7.6  HGB 10.5* 11.4* 9.8*  HCT 30.9* 34.2* 29.3*  MCV 96.6 98.3 96.7  PLT 330 343 361   Iron/TIBC/Ferritin/ %Sat    Component Value Date/Time   IRON 49 03/21/2018 1404   TIBC 207 (L) 03/21/2018 1404   FERRITIN 1,273 (H) 03/21/2018  [place] 03/21/2018 1404

## 2018-03-25 ENCOUNTER — Encounter (HOSPITAL_COMMUNITY): Payer: Self-pay | Admitting: Nephrology

## 2018-03-25 LAB — RENAL FUNCTION PANEL
ANION GAP: 15 (ref 5–15)
Albumin: 2.5 g/dL — ABNORMAL LOW (ref 3.5–5.0)
BUN: 57 mg/dL — ABNORMAL HIGH (ref 6–20)
CHLORIDE: 96 mmol/L — AB (ref 101–111)
CO2: 24 mmol/L (ref 22–32)
Calcium: 8.5 mg/dL — ABNORMAL LOW (ref 8.9–10.3)
Creatinine, Ser: 10.38 mg/dL — ABNORMAL HIGH (ref 0.61–1.24)
GFR, EST AFRICAN AMERICAN: 5 mL/min — AB (ref >60)
GFR, EST NON AFRICAN AMERICAN: 5 mL/min — AB (ref >60)
Glucose, Bld: 79 mg/dL (ref 65–99)
POTASSIUM: 3.8 mmol/L (ref 3.5–5.1)
Phosphorus: 6.4 mg/dL — ABNORMAL HIGH (ref 2.5–4.6)
Sodium: 135 mmol/L (ref 135–145)

## 2018-03-25 LAB — GLUCOSE, CAPILLARY
GLUCOSE-CAPILLARY: 107 mg/dL — AB (ref 65–99)
GLUCOSE-CAPILLARY: 119 mg/dL — AB (ref 65–99)
GLUCOSE-CAPILLARY: 68 mg/dL (ref 65–99)
GLUCOSE-CAPILLARY: 84 mg/dL (ref 65–99)
Glucose-Capillary: 99 mg/dL (ref 65–99)
Glucose-Capillary: 75 mg/dL (ref 65–99)

## 2018-03-25 LAB — CBC
HEMATOCRIT: 28.4 % — AB (ref 39.0–52.0)
Hemoglobin: 9.6 g/dL — ABNORMAL LOW (ref 13.0–17.0)
MCH: 32.4 pg (ref 26.0–34.0)
MCHC: 33.8 g/dL (ref 30.0–36.0)
MCV: 95.9 fL (ref 78.0–100.0)
Platelets: 372 10*3/uL (ref 150–400)
RBC: 2.96 MIL/uL — AB (ref 4.22–5.81)
RDW: 13.5 % (ref 11.5–15.5)
WBC: 9.2 10*3/uL (ref 4.0–10.5)

## 2018-03-25 MED ORDER — DARBEPOETIN ALFA 40 MCG/0.4ML IJ SOSY
PREFILLED_SYRINGE | INTRAMUSCULAR | Status: AC
  Administered 2018-03-25: 40 ug
  Filled 2018-03-25: qty 0.4

## 2018-03-25 NOTE — Progress Notes (Signed)
Physical Therapy Treatment Patient Details Name: Kenneth [name] MRN: [id number] DOB: Oct 31, 1955 Today's Date: 03/25/2018    History of Present Illness Pt presented with SOB and suffered cardiac arrest in ED and underwent CPR x 10 minutes. Pt s/p hypothermia protocol. Intubated 5/14. Extubated 5/20. PMH - ESRD on HD, chf, pulmonary edema, HTN, ETOH    PT Comments    Pt making slow progress. Lethargic after HD today.   Follow Up Recommendations  SNF     Equipment Recommendations  Other (comment)(to be assessed at next venue)    Recommendations for Other Services       Precautions / Restrictions Precautions Precautions: Fall Restrictions Weight Bearing Restrictions: No    Mobility  Bed Mobility Overal bed mobility: Needs Assistance Bed Mobility: Supine to Sit;Sit to Supine     Supine to sit: Mod assist Sit to supine: Min assist   General bed mobility comments: Assist for initiation of LEs to EOB and trunk to upright sitting, assist for trunk controlled descent to bed. Max verbal and tactile cues for initiation and sequencing  Transfers Overall transfer level: Needs assistance Equipment used: Rolling walker (2 wheeled) Transfers: Sit to/from Stand Sit to Stand: Min assist;+2 physical assistance;From elevated surface         General transfer comment: Min assist +2 to boost up from elevated bed. Cues for initiation  Ambulation/Gait Ambulation/Gait assistance: +2 physical assistance;Min assist Ambulation Distance (Feet): 80 Feet Assistive device: Rolling walker (2 wheeled) Gait Pattern/deviations: Step-through pattern;Decreased step length - right;Decreased step length - left;Drifts right/left;Trunk flexed Gait velocity: decr Gait velocity interpretation: <1.8 ft/sec, indicate of risk for recurrent falls General Gait Details: Assist to stear and control walker. Assist for balance and support. Verbal cues to stand more erect and stay close to walker   Stairs              Wheelchair Mobility    Modified Rankin (Stroke Patients Only)       Balance Overall balance assessment: Needs assistance Sitting-balance support: Bilateral upper extremity supported;Feet supported Sitting balance-Leahy Scale: Fair     Standing balance support: Bilateral upper extremity supported Standing balance-Leahy Scale: Poor Standing balance comment: walker and min assist for static standing                            Cognition Arousal/Alertness: Lethargic Behavior During Therapy: Flat affect Overall Cognitive Status: Impaired/Different from baseline Area of Impairment: Following commands;Safety/judgement;Problem solving;Memory                     Memory: Decreased short-term memory Following Commands: Follows one step commands with increased time Safety/Judgement: Decreased awareness of safety;Decreased awareness of deficits   Problem Solving: Slow processing;Difficulty sequencing;Decreased initiation;Requires verbal cues;Requires tactile cues General Comments: Lethargic today following HD; does follow commands with 5-10 second delay. Not communicating verbally very much today      Exercises      General Comments        Pertinent Vitals/Pain Pain Assessment: Faces Faces Pain Scale: No hurt    Home Living                      Prior Function            PT Goals (current goals can now be found in the care plan section) Acute Rehab PT Goals Patient Stated Goal: Not stated Progress towards PT goals: Progressing toward goals    Frequency  Min 2X/week      PT Plan Current plan remains appropriate;Frequency needs to be updated    Co-evaluation PT/OT/SLP Co-Evaluation/Treatment: Yes Reason for Co-Treatment: Complexity of the patient's impairments (multi-system involvement);For patient/therapist safety PT goals addressed during session: Mobility/safety with mobility;Balance OT goals addressed during session:  ADL's and self-care      AM-PAC PT "6 Clicks" Daily Activity  Outcome Measure  Difficulty turning over in bed (including adjusting bedclothes, sheets and blankets)?: Unable Difficulty moving from lying on back to sitting on the side of the bed? : Unable Difficulty sitting down on and standing up from a chair with arms (e.g., wheelchair, bedside commode, etc,.)?: Unable Help needed moving to and from a bed to chair (including a wheelchair)?: A Lot Help needed walking in hospital room?: A Lot Help needed climbing 3-5 steps with a railing? : Total 6 Click Score: 8    End of Session Equipment Utilized During Treatment: Gait belt Activity Tolerance: Patient limited by lethargy Patient left: in bed;with call bell/phone within reach;with nursing/sitter in room Nurse Communication: Mobility status PT Visit Diagnosis: Unsteadiness on feet (R26.81);Other abnormalities of gait and mobility (R26.89);History of falling (Z91.81);Muscle weakness (generalized) (M62.81)     Time: 5830-9407 PT Time Calculation (min) (ACUTE ONLY): 29 min  Charges:  $Gait Training: 8-22 mins                    G Codes:       [facility] PT [place] 03/25/2018, 4:44 PM

## 2018-03-25 NOTE — NC FL2 (Signed)
[place] LEVEL OF CARE SCREENING TOOL     IDENTIFICATION  Patient Name: Kenneth [name] Birthdate: Jan 04, 1956 Sex: male Admission Date (Current Location): 03/09/2018  [facility] and [place] Number:  Herbalist and Address:  [facility], [place] [address] [place], [place] [address]      Provider Number: [id number]  Attending Physician Name and Address:  Patrecia Pour, MD  Relative Name and Phone Number:       Current Level of Care: Hospital Recommended Level of Care: [place] Prior Approval Number:    Date Approved/Denied:   PASRR Number: [id number]  Discharge Plan: SNF    Current Diagnoses: Patient Active Problem List   Diagnosis Date Noted  . Cardiac arrest ([place]) 03/09/2018  . Acute encephalopathy   . Acute on chronic respiratory failure ([place]) 02/16/2018  . Tobacco dependence 02/16/2018  . Pulmonary edema 01/26/2018  . Acute respiratory failure with hypoxia ([place]) 01/26/2018  . Atrial fibrillation and flutter ([place])   . Shortness of breath   . Acute on chronic diastolic CHF (congestive heart failure) ([place])   . Hypothermia 10/08/2016  . ESRD on hemodialysis ([place]) 10/08/2016  . Fall 10/08/2016  . Syncope 10/07/2016  . Near syncope 07/07/2016  . Cardiomyopathy ([place]) 07/07/2016  . Ventricular tachycardia, non-sustained ([place]) 07/06/2016  . Hypoglycemia 07/06/2016  . Alcohol abuse 05/13/2016  . Hypertensive emergency 12/08/2013  . Cardiomyopathy secondary 10/09/2011  . Essential hypertension 10/09/2011  . Alcohol withdrawal delirium ([place]) 09/25/2011  . Chronic combined systolic and diastolic CHF (congestive heart failure) ([place]) 09/24/2011    Orientation RESPIRATION BLADDER Height & Weight     Self  Normal Incontinent, Continent Weight: 176 lb 2.4 oz (79.9 kg) Height:  5\' 10"  (177.8 cm)  BEHAVIORAL SYMPTOMS/MOOD NEUROLOGICAL BOWEL NUTRITION STATUS      Incontinent Diet(dsy1)  AMBULATORY STATUS  COMMUNICATION OF NEEDS Skin   Extensive Assist Verbally                         Personal Care Assistance Level of Assistance  Bathing, Feeding, Dressing Bathing Assistance: Maximum assistance Feeding assistance: Limited assistance Dressing Assistance: Maximum assistance     Functional Limitations Info  Sight, Hearing, Speech Sight Info: Adequate Hearing Info: Adequate Speech Info: Impaired    SPECIAL CARE FACTORS FREQUENCY  PT (By licensed PT), OT (By licensed OT)     PT Frequency: 5x wk OT Frequency: 5x wk            Contractures Contractures Info: Not present    Additional Factors Info  Code Status, Allergies Code Status Info: Full Code Allergies Info: No known allergies           Current Medications (03/25/2018):  This is the current hospital active medication list Current Facility-Administered Medications  Medication Dose Route Frequency Provider Last Rate Last Dose  . acetaminophen (TYLENOL) suppository 650 mg  650 mg Rectal Q6H PRN Cherene Altes, MD      . chlorhexidine (PERIDEX) 0.12 % solution 15 mL  15 mL Mouth Rinse BID Kipp Brood, MD   15 mL at 03/24/18 2056  . Chlorhexidine Gluconate Cloth 2 % PADS 6 each  6 each Topical Q0600 Roney Jaffe, MD   6 each at 03/25/18 0515  . Darbepoetin Alfa (ARANESP) injection 40 mcg  40 mcg Intravenous Q Tue-HD Rosita Fire, MD   40 mcg at 03/23/18 1227  . dextrose 5 %-0.9 % sodium chloride  infusion   Intravenous Continuous Cherene Altes, MD 30 mL/hr at 03/22/18 1719    . diltiazem (CARDIZEM) injection 5 mg  5 mg Intravenous Q6H PRN Blount, Scarlette Shorts T, NP      . famotidine (PEPCID) IVPB 20 mg premix  20 mg Intravenous QHS Kris Mouton, [facility]   Stopped at 03/24/18 2126  . feeding supplement (NEPRO CARB STEADY) liquid 237 mL  237 mL Oral BID BM Vance Gather B, MD      . fentaNYL (SUBLIMAZE) injection 12.5-25 mcg  12.5-25 mcg Intravenous Q4H PRN Magdalen Spatz, NP   25 mcg at 03/24/18 2056  . folic  acid injection 1 mg  1 mg Intravenous Daily Allie Bossier, MD   1 mg at 03/25/18 1235  . haloperidol lactate (HALDOL) injection 5 mg  5 mg Intravenous Q6H PRN Cherene Altes, MD   5 mg at 03/25/18 0647  . levalbuterol (XOPENEX) nebulizer solution 0.63 mg  0.63 mg Nebulization Q6H PRN Erick Colace, NP      . MEDLINE mouth rinse  15 mL Mouth Rinse q12n4p Agarwala, Ravi, MD   15 mL at 03/24/18 1600  . metoprolol tartrate (LOPRESSOR) injection 5 mg  5 mg Intravenous Q6H Cherene Altes, MD   5 mg at 03/25/18 0516  . nicotine (NICODERM CQ - dosed in mg/24 hours) patch 21 mg  21 mg Transdermal Daily Blount, Scarlette Shorts T, NP   21 mg at 03/25/18 1239  . [place]   Oral PRN Patrecia Pour, MD      . thiamine (B-1) injection 100 mg  100 mg Intravenous Daily Allie Bossier, MD   100 mg at 03/25/18 1233  . valproate (DEPACON) 500 mg in dextrose 5 % 50 mL IVPB  500 mg Intravenous Q12H Cherene Altes, MD 55 mL/hr at 03/25/18 1240 500 mg at 03/25/18 1240     Discharge Medications: Please see discharge summary for a list of discharge medications.  Relevant Imaging Results:  Relevant Lab Results:   Additional Information SS# [id number] dialysis [place], [place]

## 2018-03-25 NOTE — Progress Notes (Addendum)
Subjective:  On Hd ,aware today is Thursday , no cos   Objective Vital signs in last 24 hours: Vitals:   03/24/18 1759 03/25/18 0132 03/25/18 0329 03/25/18 0725  BP: 116/80 138/67 (!) 157/94 (!) (P) 149/71  Pulse: 90 87  (P) 62  Resp: 18 18  (P) 17  Temp:   98.8 F (37.1 C) (P) 98.5 F (36.9 C)  TempSrc:   Oral (P) Oral  SpO2: 100% 96%  (P) 98%  Weight:  81.4 kg (179 lb 7.3 oz)  (P) 81.5 kg (179 lb 10.8 oz)  Height:       Weight change: -0.8 kg (-1 lb 12.2 oz)  Physical Exam: General: Alert, on hd , NAD , oriented to [facility], Thursday  Heart: RRR, no m, r, g Lungs: Bilat coarse bs, non labored breathing  Abdomen: soft , NT,nD Extremities: no pedal edema  Dialysis Access: patent on HD LUA  AVF    OP Dialysis: tts [facility]  4h 47min  84kg  2/2 bath hep 2400  lua avf  -venofer 50/wk  -mircera 75 every 2 wks  -calc 2.75  -sensipar 90 tiw  Problem/Plan: 1. ESRD - HD  TTS  2. PEA Cardiac Arrest SP Cooling Procedure  3. Resp. Failure/ Acute Pulm Edema with Nonadherence- resolved on HD  4. Delirium/ Agitation  Post Arrest speaking now ,Dysphagia diet past w/ long hx etoh abuse and has some cognitive difficulties at baseline 5.  Anemia of ESRD - HGB 9.6 Aranesp 40 mcg q tues  Hd Given 5/28  6. Secondary hyperparathyroidism -  Corec Ca 10.3 no po vit d on hd sec prior npo . phos 6.4  Po binder when  Pos    Ernest Haber, PA-C [place] [phone] 03/25/2018,10:57 AM  LOS: 16 days   Pt seen, examined, agree w assess/plan as above with additions as indicated.  Kelly Splinter MD [facility] pager [phone]    cell [phone] 03/25/2018, 1:24 PM     Labs: Basic Metabolic Panel: Recent Labs  Lab 03/18/18 1100  03/22/18 0253 03/23/18 0838 03/25/18 0817  NA 137   < > 138 138 135  K 3.9   < > 3.8 4.1 3.8  CL 97*   < > 98* 99* 96*  CO2 27   < > 26 24 24   GLUCOSE 123*   < > 82 72 79  BUN 69*   < > 54* 79* 57*  CREATININE 10.92*   < > 8.44*  11.98* 10.38*  CALCIUM 8.9   < > 9.5 9.1 8.5*  PHOS 6.5*  --   --  6.0* 6.4*   < > = values in this interval not displayed.   Liver Function Tests: Recent Labs  Lab 03/18/18 1100 03/23/18 0838 03/25/18 0817  ALBUMIN 2.2* 2.5* 2.5*   No results for input(s): LIPASE, AMYLASE in the last 168 hours. Recent Labs  Lab 03/21/18 0326  AMMONIA 25   CBC: Recent Labs  Lab 03/20/18 0305 03/21/18 0326 03/22/18 0253 03/23/18 0836 03/25/18 0817  WBC 14.1* 17.1* 15.5* 11.6* 9.2  NEUTROABS  --   --  10.8* 7.6  --   HGB 9.3* 10.5* 11.4* 9.8* 9.6*  HCT 27.5* 30.9* 34.2* 29.3* 28.4*  MCV 98.2 96.6 98.3 96.7 95.9  PLT 283 330 343 361 372   Cardiac Enzymes: No results for input(s): CKTOTAL, CKMB, CKMBINDEX, TROPONINI in the last 168 hours. CBG: Recent Labs  Lab 03/24/18 1203 03/24/18 1656 03/24/18 2059 03/25/18  0127 03/25/18 0353  GLUCAP 78 104* 98 99 75     Medications: . dextrose 5 % and 0.9% NaCl 30 mL/hr at 03/22/18 1719  . famotidine (PEPCID) IV Stopped (03/24/18 2126)  . valproate sodium Stopped (03/24/18 2156)   . chlorhexidine  15 mL Mouth Rinse BID  . Chlorhexidine Gluconate Cloth  6 each Topical Q0600  . darbepoetin (ARANESP) injection - DIALYSIS  40 mcg Intravenous Q Tue-HD  . feeding supplement (NEPRO CARB STEADY)  237 mL Oral BID BM  . folic acid  1 mg Intravenous Daily  . mouth rinse  15 mL Mouth Rinse q12n4p  . metoprolol tartrate  5 mg Intravenous Q6H  . nicotine  21 mg Transdermal Daily  . thiamine injection  100 mg Intravenous Daily

## 2018-03-25 NOTE — Clinical Social Work Note (Signed)
Clinical Social Work Assessment  Patient Details  Name: Kenneth [name] MRN: [id number] Date of Birth: 1956/09/19  Date of referral:  03/25/18               Reason for consult:  Discharge Planning, Facility Placement                Permission sought to share information with:  Family Supports Permission granted to share information::  Yes, Verbal Permission Granted  Name::     Kenneth [name]  Agency::  snf  Relationship::  [phone]  Contact Information:  daughter  Housing/Transportation Living arrangements for the past 2 months:  Apartment Source of Information:  Adult Children Patient Interpreter Needed:  None Criminal Activity/Legal Involvement Pertinent to Current Situation/Hospitalization:  No - Comment as needed Significant Relationships:  Adult Children, Other Family Members, Spouse Lives with:  Spouse Do you feel safe going back to the place where you live?  Yes Need for family participation in patient care:  Yes (Comment)  Care giving concerns:  No family at bedside. CSW unable to do assessment with patient as he is only cognitive to self. CSW contacted his emergency contact daughter Kenneth [name] via phone. CSW spoke to [place] and she stated patient lives at home with spouse and she goes over the house everyday to assist parents with their needs.  Social Worker assessment / plan:  CSW spoke with [name] about discharge  plan concerning patient. [name] stated she has spoken to her mother about sending patient to facility for rehab and [name] stated spouse is agreeable. [name] stated she is agreeable as well as long as its not long term care. CSW explained to [name] the process of medicare covering patients stay at rehab. [name] stated she would like rehab to be covered by insurance because patient and spouse live on a fix income. [name] stated she would like patient to go to [facility] since she has been there in the past. CSW to follow up with [facility] and other surrounding facilities for  bed availability   Employment status:  Retired Insurance underwriter information:  Medicare PT Recommendations:  [place] / Referral to community resources:  [place]  Patient/Family's Response to care:  Patients daughter very supportive of patients needs.  Patient/Family's Understanding of and Emotional Response to Diagnosis, Current Treatment, and Prognosis: CSW to continue to follow patient/family for support. Patients daughter has appropriate response to patients illness and is hopefull patient is able to return to baseline   Emotional Assessment Appearance:  Appears stated age Attitude/Demeanor/Rapport:  Unable to Assess Affect (typically observed):  Unable to Assess Orientation:  Oriented to Self Alcohol / Substance use:  Alcohol Use Psych involvement (Current and /or in the community):  No (Comment)  Discharge Needs  Concerns to be addressed:  No discharge needs identified Readmission within the last 30 days:  No Current discharge risk:  None Barriers to Discharge:  No SNF bed, Continued Medical Work up   [employer], LCSW 03/25/2018, 3:23 PM

## 2018-03-25 NOTE — Progress Notes (Signed)
Occupational Therapy Treatment Patient Details Name: Kenneth [name] MRN: [id number] DOB: 1955-11-06 Today's Date: 03/25/2018    History of present illness Pt presented with SOB and suffered cardiac arrest in ED and underwent CPR x 10 minutes. Pt s/p hypothermia protocol. Intubated 5/14. Extubated 5/20. PMH - ESRD on HD, chf, pulmonary edema, HTN, ETOH   OT comments  Pt lethargic this afternoon following HD; does wake to tactile/auditory stimulus but not very interactive. Pt following one step commands fairly consistently with delay but does require cues for initiation and sequencing of most tasks. Pt required min-mod assist +2 for safety with functional mobility; min assist for balance, mod assist to control RW. Pt able to complete grooming task seated EOB with min guard assist. D/c plan remains appropriate. Will continue to follow acutely.   Follow Up Recommendations  SNF    Equipment Recommendations  Other (comment)(TBD at next venue)    Recommendations for Other Services      Precautions / Restrictions Precautions Precautions: Fall Restrictions Weight Bearing Restrictions: No       Mobility Bed Mobility Overal bed mobility: Needs Assistance Bed Mobility: Supine to Sit;Sit to Supine     Supine to sit: Mod assist Sit to supine: Min assist   General bed mobility comments: Assist for initiation of LEs to EOB and trunk to upright sitting, assist for trunk controlled descent to bed. Max verbal and tactile cues for initiation and sequencing  Transfers Overall transfer level: Needs assistance Equipment used: Rolling walker (2 wheeled) Transfers: Sit to/from Stand Sit to Stand: Min assist;+2 physical assistance;From elevated surface         General transfer comment: Min assist +2 to boost up from elevated bed. Cues for initiation    Balance Overall balance assessment: Needs assistance Sitting-balance support: Bilateral upper extremity supported;Feet supported Sitting  balance-Leahy Scale: Fair     Standing balance support: Bilateral upper extremity supported Standing balance-Leahy Scale: Poor                             ADL either performed or assessed with clinical judgement   ADL Overall ADL's : Needs assistance/impaired     Grooming: Set up;Min guard;Sitting;Wash/dry face Grooming Details (indicate cue type and reason): Verbal and tactile cues for initiation                 Toilet Transfer: Moderate assistance;+2 for safety/equipment;Ambulation;Regular Toilet;RW;Grab bars Toilet Transfer Details (indicate cue type and reason): Min assist for balance, mod assist to control RW Toileting- Clothing Manipulation and Hygiene: Total assistance;Sit to/from stand Toileting - Clothing Manipulation Details (indicate cue type and reason): for peri care in standing     Functional mobility during ADLs: Moderate assistance;Rolling walker       Vision       Perception     Praxis      Cognition Arousal/Alertness: Lethargic Behavior During Therapy: Flat affect Overall Cognitive Status: Impaired/Different from baseline Area of Impairment: Following commands;Safety/judgement;Problem solving;Memory                     Memory: Decreased short-term memory Following Commands: Follows one step commands with increased time Safety/Judgement: Decreased awareness of safety;Decreased awareness of deficits   Problem Solving: Slow processing;Difficulty sequencing;Decreased initiation;Requires verbal cues;Requires tactile cues General Comments: Lethargic today following HD; does follow commands with 5-10 second delay. Not communicating verbally very much today        Exercises  Shoulder Instructions       General Comments      Pertinent Vitals/ Pain       Pain Assessment: Faces Faces Pain Scale: No hurt  Home Living                                          Prior Functioning/Environment               Frequency  Min 2X/week        Progress Toward Goals  OT Goals(current goals can now be found in the care plan section)  Progress towards OT goals: Progressing toward goals  Acute Rehab OT Goals Patient Stated Goal: Not stated OT Goal Formulation: With patient  Plan Discharge plan remains appropriate    Co-evaluation    PT/OT/SLP Co-Evaluation/Treatment: Yes Reason for Co-Treatment: Complexity of the patient's impairments (multi-system involvement);For patient/therapist safety;To address functional/ADL transfers   OT goals addressed during session: ADL's and self-care      AM-PAC PT "6 Clicks" Daily Activity     Outcome Measure   Help from another person eating meals?: A Lot Help from another person taking care of personal grooming?: A Little Help from another person toileting, which includes using toliet, bedpan, or urinal?: A Lot Help from another person bathing (including washing, rinsing, drying)?: A Lot Help from another person to put on and taking off regular upper body clothing?: A Lot Help from another person to put on and taking off regular lower body clothing?: A Lot 6 Click Score: 13    End of Session Equipment Utilized During Treatment: Gait belt;Rolling walker  OT Visit Diagnosis: Unsteadiness on feet (R26.81);Other abnormalities of gait and mobility (R26.89);Muscle weakness (generalized) (M62.81);Other symptoms and signs involving cognitive function   Activity Tolerance Patient tolerated treatment well;Patient limited by lethargy   Patient Left in bed;with call bell/phone within reach;with nursing/sitter in room   Nurse Communication          Time: 1638-4665 OT Time Calculation (min): 27 min  Charges: OT General Charges $OT Visit: 1 Visit OT Treatments $Self Care/Home Management : 8-22 mins  Kenneth [name] A. Ulice Brilliant, M.S., OTR/L Acute Rehab Department: [phone]  Binnie Kand 03/25/2018, 3:30 PM

## 2018-03-25 NOTE — Progress Notes (Addendum)
PROGRESS NOTE  Kenneth [name]  QIO:[id number] DOB: 1955-10-29 DOA: 03/09/2018 PCP: [name], MD   Brief Narrative: 62 year oldMw/ a Hxof ESRD on HDT/TH/Sat,EtOH abuse, Chronic combined systolic and diastolic heart failure, and HTN who presented with worsening dyspnea w/ only mild respiratory distress initially appreciable in the ED.  He then collapsed while getting up to use the commode, suffering a PEA arrest.  During the code he received empiric treatment for hyperkalemia, shock x1 for WCT, and amiodarone. ROSC after 10 mins of ACLS followed by targeted temperature management. Following this he has continued to be encephalopathic and had AFib with RVR, since reverted to NSR.  Assessment & Plan: Active Problems:   Cardiac arrest ([place])   Acute encephalopathy  Altered mental status: Based on recent discharge summary, noting he is only oriented x2 at that time, he is actually returned mostly to baseline.  - Continue supportive care for patient safety. Will need to be without sitter to be placed. - Strongly suspect element of acute delirium and/or sequelae of anoxic brain injury.  Dysphagia: Possibly related to encephalopathy. Repeat MBS 5/29 showed mod-severe oral and pharyngeal dysphagia - Dysphagia diet ordered. Only to be fed when alert. Honey thickened liquids via tsp only. Does not appear to need cortrak.  Acute hypoxic respiratory failure: Due to pulmonary edema due to noncompliance with HD.  - Resolved.  Cardiac arrest: x10 min prior to ROSC, TTM.   ESRD:  - Continue HD per nephrology  Chronic diastolic CHF, HTN: With severe LVH - BP/volume management with HD  AFib with RVR: Resolved.  - Not candidate for anticoagulation due to chronic nonadherence.  - Rate is controlled  EtOH abuse: No further need for CIWA  Anemia of chronic disease: Stable.  - Monitor  DVT prophylaxis: Heparin Code Status: Full Family Communication: None at bedside Disposition Plan: SNF  pending bed availability. He is medically stable for discharge, discussed with CSW.  Consultants:   Nephrology  Cardiology  PCCM  Subjective: Dry mouth, otherwise no complaints. Was agitated earlier in hemodialysis and sitter was requested.   Objective: Vitals:   03/25/18 1100 03/25/18 1130 03/25/18 1145 03/25/18 1235  BP: 127/78 (!) 132/56 124/73 91/62  Pulse: 74 78 74 83  Resp:   18 18  Temp:   98.8 F (37.1 C)   TempSrc:   Oral   SpO2:   96%   Weight:   79.9 kg (176 lb 2.4 oz)   Height:        Intake/Output Summary (Last 24 hours) at 03/25/2018 1326 Last data filed at 03/25/2018 1145 Gross per 24 hour  Intake 240 ml  Output 2000 ml  Net -1760 ml   Filed Weights   03/25/18 0132 03/25/18 0725 03/25/18 1145  Weight: 81.4 kg (179 lb 7.3 oz) 81.5 kg (179 lb 10.8 oz) 79.9 kg (176 lb 2.4 oz)    Gen: Confused but not agitated in no distress Pulm: Non-labored breathing. Transmitted upper airway sounds, otherwise he is clear to auscultation bilaterally.  CV: Regular rate and rhythm. No murmur, rub, or gallop. No JVD, no significant pedal edema. GI: Abdomen soft, non-tender, non-distended, with normoactive bowel sounds. No organomegaly or masses felt. Ext: Warm, no deformities Skin: No rashes, lesions or ulcers Neuro: More alert with HD today. Oriented to person and date (knew May 30th, Thursday), not location. When told his heart stopped he says I know. No focal neurological deficits on limited exam. Psych: Judgement and insight appear impaired. Mood depressed with restricted  affect following medication administration, no inappropriate behaviors.  Data Reviewed: I have personally reviewed following labs and imaging studies  CBC: Recent Labs  Lab 03/20/18 0305 03/21/18 0326 03/22/18 0253 03/23/18 0836 03/25/18 0817  WBC 14.1* 17.1* 15.5* 11.6* 9.2  NEUTROABS  --   --  10.8* 7.6  --   HGB 9.3* 10.5* 11.4* 9.8* 9.6*  HCT 27.5* 30.9* 34.2* 29.3* 28.4*  MCV 98.2 96.6  98.3 96.7 95.9  PLT 283 330 343 361 299   Basic Metabolic Panel: Recent Labs  Lab 03/19/18 0405 03/20/18 0305 03/21/18 0326 03/22/18 0253 03/23/18 0838 03/25/18 0817  NA 137 134* 134* 138 138 135  K 3.3* 3.4* 3.9 3.8 4.1 3.8  CL 93* 91* 95* 98* 99* 96*  CO2 32 29 27 26 24 24   GLUCOSE 112* 119* 97 82 72 79  BUN 33* 60* 32* 54* 79* 57*  CREATININE 6.35* 8.72* 5.81* 8.44* 11.98* 10.38*  CALCIUM 8.5* 8.9 8.9 9.5 9.1 8.5*  MG 2.1  --  2.1 2.5*  --   --   PHOS  --   --   --   --  6.0* 6.4*   GFR: Estimated Creatinine Clearance: 7.4 mL/min (A) (by C-G formula based on SCr of 10.38 mg/dL (H)). Liver Function Tests: Recent Labs  Lab 03/23/18 0838 03/25/18 0817  ALBUMIN 2.5* 2.5*   No results for input(s): LIPASE, AMYLASE in the last 168 hours. Recent Labs  Lab 03/21/18 0326  AMMONIA 25   Coagulation Profile: No results for input(s): INR, PROTIME in the last 168 hours. Cardiac Enzymes: No results for input(s): CKTOTAL, CKMB, CKMBINDEX, TROPONINI in the last 168 hours. BNP (last 3 results) No results for input(s): PROBNP in the last 8760 hours. HbA1C: No results for input(s): HGBA1C in the last 72 hours. CBG: Recent Labs  Lab 03/24/18 1203 03/24/18 1656 03/24/18 2059 03/25/18 0127 03/25/18 0353  GLUCAP 78 104* 98 99 75   Lipid Profile: No results for input(s): CHOL, HDL, LDLCALC, TRIG, CHOLHDL, LDLDIRECT in the last 72 hours. Thyroid Function Tests: No results for input(s): TSH, T4TOTAL, FREET4, T3FREE, THYROIDAB in the last 72 hours. Anemia Panel: No results for input(s): VITAMINB12, FOLATE, FERRITIN, TIBC, IRON, RETICCTPCT in the last 72 hours. Urine analysis:    Component Value Date/Time   COLORURINE YELLOW 05/12/2016 0834   APPEARANCEUR CLOUDY (A) 05/12/2016 0834   LABSPEC 1.014 05/12/2016 0834   PHURINE 5.0 05/12/2016 0834   GLUCOSEU NEGATIVE 05/12/2016 0834   HGBUR MODERATE (A) 05/12/2016 0834   BILIRUBINUR NEGATIVE 05/12/2016 0834   KETONESUR  NEGATIVE 05/12/2016 0834   PROTEINUR 100 (A) 05/12/2016 0834   UROBILINOGEN 0.2 12/09/2013 0013   NITRITE NEGATIVE 05/12/2016 0834   LEUKOCYTESUR NEGATIVE 05/12/2016 0834   No results found for this or any previous visit (from the past 240 hour(s)).    Radiology Studies: Dg Swallowing Func-speech Pathology  Result Date: 03/24/2018 Objective Swallowing Evaluation: Type of Study: MBS-Modified Barium Swallow Study  Patient Details Name: Kenneth [name] MRN: [id number] Date of Birth: 1956-08-15 Today's Date: 03/24/2018 Time: SLP Start Time (ACUTE ONLY): 1015 -SLP Stop Time (ACUTE ONLY): 1035 SLP Time Calculation (min) (ACUTE ONLY): 20 min Past Medical History: Past Medical History: Diagnosis Date . Acute CHF ([place]) 01/2018 . Cardiomyopathy secondary   likely related to HTN heart disease; possibly ETOH related as well . Chronic combined systolic and diastolic heart failure (HCC)   Echocardiogram 09/22/11: Moderate LVH, EF 38-10%, grade 3 diastolic dysfunction, mild MR, moderate to severe LAE,  mild RVE, mild to moderate TR, small to moderate pericardial effusion . ESRD (end stage renal disease) on dialysis [facility])   due to hypertensive nephrosclerosis . History of alcohol abuse  . Hypertension  Past Surgical History: Past Surgical History: Procedure Laterality Date . AV FISTULA PLACEMENT Left 05/14/2016  Procedure: LEFT ARM BASILIC VEIN TRANSPOSITION;  Surgeon: Rosetta Posner, MD;  Location: [place];  Service: Vascular;  Laterality: Left; . PERIPHERAL VASCULAR CATHETERIZATION N/A 05/13/2016  Procedure: Dialysis/Perma Catheter Insertion;  Surgeon: Serafina Mitchell, MD;  Location: [place] [facility];  Service: Cardiovascular;  Laterality: N/A; HPI: 62 y/o male admitted on 5/14 after having a cardiac arrest in the setting of a PEA arrest and wide complex tachycardia.  Had 10 minutes of CPR. ETT 5/14-5/20. CT Head showed no acute changes but advanced cerebral and cerebellar atrophy, remote bilateral lacunar infarcts involving the  basal ganglia. PMH:  ERSD, sCHF, EtOH abuse, HTN. Pt had a BSE in July 2017 recommending Dys 3 diet and thin liquids by cup due to cognitively-based dysphagia. He was advanced to regular textures and thin liquids by cup or straw prior to d/c.  CXR negative.  MBS indicated - pt has pulled out his feeding tube and is alert for eval per RN.   Subjective: Pt seen in radiology for MBS Assessment / Plan / Recommendation CHL IP CLINICAL IMPRESSIONS 03/24/2018 Clinical Impression Pt presents with lethargy, requiring almost constant stimulation with cold wet cloth to maintain alertness. Pt exhibits moderate-severe oral and pharyngeal dysphagia, characterized as follows: ORALLY, pt demonstrates no bolus formation and poor propulsion on all consistencies; rather, boluses are noted to spill prematurely over the tongue base to the level of the vallecula where the PHARYNGEAL swallow reflex is delayed. Vallecular residue is noted after the swallow, due to decreased tongue base retraction. Trace penetration is noted during the swallow on nectar and honey thick liquids, however, penetrate is not aspirated. There was no cough or throat clear noted in response to penetration event. Solids requiring mastication were not given during this evaluation, due to poor bolus manipulation seen on other consistencies. At this time, recommend  puree solids with honey thick liquids VIA TSP only. PO intake should be given ONLY WHEN PT IS FULLY ALERT, upright, and appropriate for puree and honey thick liquid trials. Full supervision is recommended to insure pt has fully cleared oral cavity prior to taking next bolus. Safe swallow precautions were sent back to pt room with transport. SLP will follow up at bedside to assess diet tolerance and provide education regarding safe swallow precautions, and the importance of adherence to them. SLP Visit Diagnosis Dysphagia, oropharyngeal phase (R13.12) Impact on safety and function Moderate aspiration  risk;Severe aspiration risk;Risk for inadequate nutrition/hydration   CHL IP TREATMENT RECOMMENDATION 03/24/2018 Treatment Recommendations Therapy as outlined in treatment plan below   Prognosis 03/24/2018 Prognosis for Safe Diet Advancement Fair Barriers to Reach Goals Cognitive deficits CHL IP DIET RECOMMENDATION 03/24/2018 SLP Diet Recommendations Dysphagia 1 (Puree) solids;Honey thick liquids Liquid Administration via Spoon Medication Administration Crushed with puree Compensations Minimize environmental distractions;Small sips/bites;Slow rate Postural Changes Remain semi-upright after after feeds/meals (Comment);Seated upright at 90 degrees   CHL IP OTHER RECOMMENDATIONS 03/24/2018 Oral Care Recommendations Oral care QID Other Recommendations Have oral suction available   CHL IP FOLLOW UP RECOMMENDATIONS 03/24/2018 Follow up Recommendations Skilled Nursing facility   [facility] IP FREQUENCY AND DURATION 03/24/2018 Speech Therapy Frequency (ACUTE ONLY) min 2x/week Treatment Duration 1 week;2 weeks  CHL IP ORAL PHASE 03/24/2018 Oral Phase Impaired Oral - Honey Teaspoon Reduced posterior propulsion;Decreased bolus cohesion;Delayed oral transit;Weak lingual manipulation;Holding of bolus;Premature spillage;Piecemeal swallowing Oral - Honey Cup/straw Weak lingual manipulation;Delayed oral transit;Holding of bolus;Reduced posterior propulsion;Decreased bolus cohesion;Premature spillage;Piecemeal swallowing Oral - Nectar Teaspoon Reduced posterior propulsion;Holding of bolus;Weak lingual manipulation;Delayed oral transit;Decreased bolus cohesion;Piecemeal swallowing Oral - Puree Piecemeal swallowing;Weak lingual manipulation;Reduced posterior propulsion;Holding of bolus;Delayed oral transit;Decreased bolus cohesion;Premature spillage  CHL IP PHARYNGEAL PHASE 03/24/2018 Pharyngeal Phase Impaired Pharyngeal- Honey Cup Delayed swallow initiation-vallecula;Pharyngeal residue - valleculae;Penetration/Aspiration during swallow;Reduced  airway/laryngeal closure;Reduced tongue base retraction Pharyngeal Material enters airway, remains ABOVE vocal cords then ejected out;Material does not enter airway Pharyngeal- Nectar Teaspoon Delayed swallow initiation-vallecula;Pharyngeal residue - valleculae;Penetration/Aspiration during swallow;Reduced airway/laryngeal closure;Reduced tongue base retraction Pharyngeal Material enters airway, remains ABOVE vocal cords and not ejected out Pharyngeal- Puree Delayed swallow initiation-vallecula;Pharyngeal residue - valleculae;Reduced tongue base retraction  CHL IP CERVICAL ESOPHAGEAL PHASE 03/24/2018 Cervical Esophageal Phase [facility], [place] Speech Language Pathologist [phone] Shonna Chock 03/24/2018, 11:38 AM               Scheduled Meds: . chlorhexidine  15 mL Mouth Rinse BID  . Chlorhexidine Gluconate Cloth  6 each Topical Q0600  . darbepoetin (ARANESP) injection - DIALYSIS  40 mcg Intravenous Q Tue-HD  . feeding supplement (NEPRO CARB STEADY)  237 mL Oral BID BM  . folic acid  1 mg Intravenous Daily  . mouth rinse  15 mL Mouth Rinse q12n4p  . metoprolol tartrate  5 mg Intravenous Q6H  . nicotine  21 mg Transdermal Daily  . thiamine injection  100 mg Intravenous Daily   Continuous Infusions: . dextrose 5 % and 0.9% NaCl 30 mL/hr at 03/22/18 1719  . famotidine (PEPCID) IV Stopped (03/24/18 2126)  . valproate sodium 500 mg (03/25/18 1240)     LOS: 16 days   Time spent: 25 minutes.  Patrecia Pour, MD [facility] [email or web address] Password [email or web address] 03/25/2018, 1:26 PM

## 2018-03-26 LAB — GLUCOSE, CAPILLARY
GLUCOSE-CAPILLARY: 110 mg/dL — AB (ref 65–99)
GLUCOSE-CAPILLARY: 90 mg/dL (ref 65–99)
GLUCOSE-CAPILLARY: 107 mg/dL — AB (ref 65–99)
Glucose-Capillary: 116 mg/dL — ABNORMAL HIGH (ref 65–99)
Glucose-Capillary: 68 mg/dL (ref 65–99)
Glucose-Capillary: 89 mg/dL (ref 65–99)

## 2018-03-26 LAB — BASIC METABOLIC PANEL
Anion gap: 15 (ref 5–15)
BUN: 32 mg/dL — ABNORMAL HIGH (ref 6–20)
CALCIUM: 9 mg/dL (ref 8.9–10.3)
CO2: 26 mmol/L (ref 22–32)
Chloride: 95 mmol/L — ABNORMAL LOW (ref 101–111)
Creatinine, Ser: 7.41 mg/dL — ABNORMAL HIGH (ref 0.61–1.24)
GFR, EST AFRICAN AMERICAN: 8 mL/min — AB (ref >60)
GFR, EST NON AFRICAN AMERICAN: 7 mL/min — AB (ref >60)
Glucose, Bld: 92 mg/dL (ref 65–99)
POTASSIUM: 4.1 mmol/L (ref 3.5–5.1)
SODIUM: 136 mmol/L (ref 135–145)

## 2018-03-26 MED ORDER — DEXTROSE 50 % IV SOLN
INTRAVENOUS | Status: AC
  Administered 2018-03-26: 25 mL
  Filled 2018-03-26: qty 50

## 2018-03-26 MED ORDER — CHLORHEXIDINE GLUCONATE CLOTH 2 % EX PADS: 6.0000 | MEDICATED_PAD | Freq: Every day | CUTANEOUS | Status: DC

## 2018-03-26 MED ORDER — FAMOTIDINE 20 MG PO TABS
20.0000 mg | ORAL_TABLET | Freq: Every day | ORAL | Status: DC
  Filled 2018-03-26 (×2): qty 1

## 2018-03-26 NOTE — Progress Notes (Signed)
Subjective:  Seen in room  Objective Vital signs in last 24 hours: Vitals:   03/25/18 2312 03/26/18 0300 03/26/18 0710 03/26/18 1153  BP: 130/72 131/77 124/87 (!) 112/37  Pulse: 78 74 72 80  Resp: (!) 21 19 18 18   Temp: 98.5 F (36.9 C) 98.9 F (37.2 C) 98.9 F (37.2 C)   TempSrc: Oral Oral Oral   SpO2: 97% 100% 97%   Weight:  81.2 kg (179 lb 0.2 oz)    Height:       Weight change: 0.1 kg (3.5 oz)  Physical Exam: General: Alert, on hd , NAD , oriented to [employer], Thursday  Heart: RRR, no m, r, g Lungs: Bilat coarse bs, non labored breathing  Abdomen: soft , NT,nD Extremities: no pedal edema  Dialysis Access: patent on HD LUA  AVF    OP Dialysis: tts [facility]  4h 28min  84kg  2/2 bath hep 2400  lua avf  -venofer 50/wk  -mircera 75 every 2 wks  -calc 2.75  -sensipar 90 tiw  Problem/Plan: 1. ESRD - HD  TTS  2. PEA Cardiac Arrest SP Cooling Procedure  3. Resp. Failure/ Acute Pulm Edema with Nonadherence- resolved on HD  4. Delirium/ Agitation  Post Arrest speaking now ,Dysphagia diet, pt w/ long hx etoh abuse and has some cognitive difficulties at baseline 5.  Anemia of ESRD - HGB 9.6 Aranesp 40 mcg q tues  Hd Given 5/28  6. Secondary hyperparathyroidism -  Corec Ca 10.3 no po vit d on hd sec prior npo . phos 6.4  Po binder when  Pos  7. dispo - has SNF lined up per chart plan is for dc to snf after HD [place] MD [facility] pgr [phone]   03/26/2018, 2:24 PM         Labs: Basic Metabolic Panel: Recent Labs  Lab 03/23/18 0838 03/25/18 0817 03/26/18 1228  NA 138 135 136  K 4.1 3.8 4.1  CL 99* 96* 95*  CO2 24 24 26   GLUCOSE 72 79 92  BUN 79* 57* 32*  CREATININE 11.98* 10.38* 7.41*  CALCIUM 9.1 8.5* 9.0  PHOS 6.0* 6.4*  --    Liver Function Tests: Recent Labs  Lab 03/23/18 0838 03/25/18 0817  ALBUMIN 2.5* 2.5*   No results for input(s): LIPASE, AMYLASE in the last 168 hours. Recent Labs  Lab 03/21/18 0326   AMMONIA 25   CBC: Recent Labs  Lab 03/20/18 0305 03/21/18 0326 03/22/18 0253 03/23/18 0836 03/25/18 0817  WBC 14.1* 17.1* 15.5* 11.6* 9.2  NEUTROABS  --   --  10.8* 7.6  --   HGB 9.3* 10.5* 11.4* 9.8* 9.6*  HCT 27.5* 30.9* 34.2* 29.3* 28.4*  MCV 98.2 96.6 98.3 96.7 95.9  PLT 283 330 343 361 372   Cardiac Enzymes: No results for input(s): CKTOTAL, CKMB, CKMBINDEX, TROPONINI in the last 168 hours. CBG: Recent Labs  Lab 03/25/18 2051 03/25/18 2336 03/26/18 0302 03/26/18 0853 03/26/18 1232  GLUCAP 107* 119* 110* 90 116*     Medications: . valproate sodium Stopped (03/26/18 1302)   . chlorhexidine  15 mL Mouth Rinse BID  . Chlorhexidine Gluconate Cloth  6 each Topical Q0600  . darbepoetin (ARANESP) injection - DIALYSIS  40 mcg Intravenous Q Tue-HD  . famotidine  20 mg Oral Daily  . feeding supplement (NEPRO CARB STEADY)  237 mL Oral BID BM  . folic acid  1 mg Intravenous Daily  . mouth rinse  15 mL Mouth Rinse q12n4p  . metoprolol tartrate  5 mg Intravenous Q6H  . nicotine  21 mg Transdermal Daily  . thiamine injection  100 mg Intravenous Daily

## 2018-03-26 NOTE — Progress Notes (Signed)
Pt has not had sitter at bedside since 7 this morning. Will continue to monitor.    Ara Kussmaul BSN, RN

## 2018-03-26 NOTE — Progress Notes (Signed)
PROGRESS NOTE    Kenneth [name]  LFY:[id number] DOB: Dec 02, 1955 DOA: 03/09/2018 PCP: [name], MD     Brief Narrative:  Kenneth [name] is a 59 year oldMw/ a Hxof ESRD on HD T/TH/Sat,EtOH abuse, chronic combined systolic and diastolic heart failure, and HTN who presented with worsening dyspnea w/ only mild respiratory distress initially appreciable in the ED. He then collapsed while getting up to use the commode, suffering a PEA arrest. During the code he received empiric treatment for hyperkalemia, shock x1 for WCT, and amiodarone. ROSC after 10 mins of ACLS followed by targeted temperature management. Following this he has continued to be encephalopathic and had AFib with RVR, since reverted to NSR.  New events last 24 hours / Subjective: No acute events overnight. Sitter free since 7am this morning. SNF placement available tomorrow 6/1 after HD treatment   Assessment & Plan:   Active Problems:   Cardiac arrest ([place])   Acute encephalopathy   Altered mental status s/p PEA cardiac arrest - Based on recent discharge summary, noting he is only oriented x2 at that time, he is actually returned mostly to baseline. Strongly suspect element of acute delirium and/or sequelae of anoxic brain injury.  Dysphagia - MBS 5/29 showed mod-severe oral and pharyngeal dysphagia - Dysphagia diet ordered. Only to be fed when alert. Honey thickened liquids via tsp only. Does not appear to need cortrak.  Acute hypoxic respiratory failure - Due to pulmonary edema due to noncompliance with HD - Resolved now on room air   ESRD TTSa  - Continue HD per nephrology  Chronic diastolic CHF, HTN - BP/volume management with HD  AFib with RVR - Not candidate for anticoagulation due to chronic nonadherence.  - Rate is controlled today, remains normal sinus   EtOH abuse - No further need for CIWA  Anemia of chronic disease - Stable - Monitor   DVT prophylaxis: SCD Code Status:  Full Family Communication: No family at bedside Disposition Plan: SNF 6/1 after HD    Consultants:   Nephrology  Cardiology  PCCM   Antimicrobials:  Anti-infectives (From admission, onward)   None       Objective: Vitals:   03/25/18 2312 03/26/18 0300 03/26/18 0710 03/26/18 1153  BP: 130/72 131/77 124/87 (!) 112/37  Pulse: 78 74 72 80  Resp: (!) 21 19 18    Temp: 98.5 F (36.9 C) 98.9 F (37.2 C) 98.9 F (37.2 C)   TempSrc: Oral Oral Oral   SpO2: 97% 100% 97%   Weight:  81.2 kg (179 lb 0.2 oz)    Height:        Intake/Output Summary (Last 24 hours) at 03/26/2018 1404 Last data filed at 03/26/2018 0908 Gross per 24 hour  Intake 589.5 ml  Output -  Net 589.5 ml   Filed Weights   03/25/18 0725 03/25/18 1145 03/26/18 0300  Weight: 81.5 kg (179 lb 10.8 oz) 79.9 kg (176 lb 2.4 oz) 81.2 kg (179 lb 0.2 oz)    Examination:  General exam: Appears calm and comfortable  Respiratory system: Clear to auscultation. Respiratory effort normal. Cardiovascular system: S1 & S2 heard, RRR. No JVD, murmurs, rubs, gallops or clicks. No pedal edema. Gastrointestinal system: Abdomen is nondistended, soft and nontender. No organomegaly or masses felt. Normal bowel sounds heard. Central nervous system: Alert and oriented to [facility] but not to year  Extremities: Symmetric 5 x 5 power. Skin: No rashes, lesions or ulcers Psychiatry: Judgement and insight appear stable without delirium   Data  Reviewed: I have personally reviewed following labs and imaging studies  CBC: Recent Labs  Lab 03/20/18 0305 03/21/18 0326 03/22/18 0253 03/23/18 0836 03/25/18 0817  WBC 14.1* 17.1* 15.5* 11.6* 9.2  NEUTROABS  --   --  10.8* 7.6  --   HGB 9.3* 10.5* 11.4* 9.8* 9.6*  HCT 27.5* 30.9* 34.2* 29.3* 28.4*  MCV 98.2 96.6 98.3 96.7 95.9  PLT 283 330 343 361 707   Basic Metabolic Panel: Recent Labs  Lab 03/21/18 0326 03/22/18 0253 03/23/18 0838 03/25/18 0817 03/26/18 1228  NA 134*  138 138 135 136  K 3.9 3.8 4.1 3.8 4.1  CL 95* 98* 99* 96* 95*  CO2 27 26 24 24 26   GLUCOSE 97 82 72 79 92  BUN 32* 54* 79* 57* 32*  CREATININE 5.81* 8.44* 11.98* 10.38* 7.41*  CALCIUM 8.9 9.5 9.1 8.5* 9.0  MG 2.1 2.5*  --   --   --   PHOS  --   --  6.0* 6.4*  --    GFR: Estimated Creatinine Clearance: 10.4 mL/min (A) (by C-G formula based on SCr of 7.41 mg/dL (H)). Liver Function Tests: Recent Labs  Lab 03/23/18 0838 03/25/18 0817  ALBUMIN 2.5* 2.5*   No results for input(s): LIPASE, AMYLASE in the last 168 hours. Recent Labs  Lab 03/21/18 0326  AMMONIA 25   Coagulation Profile: No results for input(s): INR, PROTIME in the last 168 hours. Cardiac Enzymes: No results for input(s): CKTOTAL, CKMB, CKMBINDEX, TROPONINI in the last 168 hours. BNP (last 3 results) No results for input(s): PROBNP in the last 8760 hours. HbA1C: No results for input(s): HGBA1C in the last 72 hours. CBG: Recent Labs  Lab 03/25/18 2051 03/25/18 2336 03/26/18 0302 03/26/18 0853 03/26/18 1232  GLUCAP 107* 119* 110* 90 116*   Lipid Profile: No results for input(s): CHOL, HDL, LDLCALC, TRIG, CHOLHDL, LDLDIRECT in the last 72 hours. Thyroid Function Tests: No results for input(s): TSH, T4TOTAL, FREET4, T3FREE, THYROIDAB in the last 72 hours. Anemia Panel: No results for input(s): VITAMINB12, FOLATE, FERRITIN, TIBC, IRON, RETICCTPCT in the last 72 hours. Sepsis Labs: No results for input(s): PROCALCITON, LATICACIDVEN in the last 168 hours.  No results found for this or any previous visit (from the past 240 hour(s)).     Radiology Studies: No results found.    Scheduled Meds: . chlorhexidine  15 mL Mouth Rinse BID  . Chlorhexidine Gluconate Cloth  6 each Topical Q0600  . darbepoetin (ARANESP) injection - DIALYSIS  40 mcg Intravenous Q Tue-HD  . famotidine  20 mg Oral Daily  . feeding supplement (NEPRO CARB STEADY)  237 mL Oral BID BM  . folic acid  1 mg Intravenous Daily  . mouth  rinse  15 mL Mouth Rinse q12n4p  . metoprolol tartrate  5 mg Intravenous Q6H  . nicotine  21 mg Transdermal Daily  . thiamine injection  100 mg Intravenous Daily   Continuous Infusions: . valproate sodium Stopped (03/26/18 1302)     LOS: 17 days    Time spent: 40 minutes   Dessa Phi, DO [facility] [email or web address] Password [email or web address] 03/26/2018, 2:04 PM

## 2018-03-26 NOTE — Progress Notes (Signed)
  Speech Language Pathology Treatment: Dysphagia  Patient Details Name: Kenneth [name] MRN: [id number] DOB: 03-28-1956 Today's Date: 03/26/2018 Time: 4128-7867 SLP Time Calculation (min) (ACUTE ONLY): 28 min  Assessment / Plan / Recommendation Clinical Impression  SLP facilitated dysphagia strategies and safety during lunch. He needed mod-max tactile assist for self feeding due to decreased ability to sustain attention. Mild diffuse oral residue and mildly prolonged oral transit. Multiple swallows observed; no signs of aspiration. Continue Dys 1, honey thick via teaspoon and full assist from staff.    HPI HPI: 62 y/o male admitted on 5/14 after having a cardiac arrest in the setting of a PEA arrest and wide complex tachycardia.  Had 10 minutes of CPR. ETT 5/14-5/20. CT Head showed no acute changes but advanced cerebral and cerebellar atrophy, remote bilateral lacunar infarcts involving the basal ganglia. PMH:  ERSD, sCHF, EtOH abuse, HTN. Pt had a BSE in July 2017 recommending Dys 3 diet and thin liquids by cup due to cognitively-based dysphagia. He was advanced to regular textures and thin liquids by cup or straw prior to d/c.  CXR negative.  MBS indicated - pt has pulled out his feeding tube and is alert for eval per RN.        SLP Plan  Continue with current plan of care       Recommendations  Diet recommendations: Dysphagia 1 (puree);Honey-thick liquid Liquids provided via: Teaspoon Medication Administration: Crushed with puree Supervision: Patient able to self feed;Staff to assist with self feeding;Full supervision/cueing for compensatory strategies Compensations: Minimize environmental distractions;Slow rate;Small sips/bites;Lingual sweep for clearance of pocketing(liquids via teaspoon) Postural Changes and/or Swallow Maneuvers: Seated upright 90 degrees                Oral Care Recommendations: Oral care BID Follow up Recommendations: Skilled Nursing facility SLP Visit  Diagnosis: Dysphagia, oropharyngeal phase (R13.12) Plan: Continue with current plan of care       GO                Houston Siren 03/26/2018, 2:39 PM   Orbie Pyo Keitha Kolk M.Ed [employer] [phone]

## 2018-03-26 NOTE — Progress Notes (Signed)
Pt's glucose 68. Pt in deep sleep and is at risk for aspiration. Pt not alert to eat dinner. 63mL dextrose given through IV. Will recheck glucose.   Ara Kussmaul BSN, RN

## 2018-03-27 DIAGNOSIS — I4891 Unspecified atrial fibrillation: Secondary | ICD-10-CM | POA: Diagnosis not present

## 2018-03-27 DIAGNOSIS — R1312 Dysphagia, oropharyngeal phase: Secondary | ICD-10-CM | POA: Diagnosis not present

## 2018-03-27 DIAGNOSIS — I120 Hypertensive chronic kidney disease with stage 5 chronic kidney disease or end stage renal disease: Secondary | ICD-10-CM | POA: Diagnosis not present

## 2018-03-27 DIAGNOSIS — I469 Cardiac arrest, cause unspecified: Secondary | ICD-10-CM | POA: Diagnosis not present

## 2018-03-27 DIAGNOSIS — D631 Anemia in chronic kidney disease: Secondary | ICD-10-CM | POA: Diagnosis not present

## 2018-03-27 DIAGNOSIS — I132 Hypertensive heart and chronic kidney disease with heart failure and with stage 5 chronic kidney disease, or end stage renal disease: Secondary | ICD-10-CM | POA: Diagnosis not present

## 2018-03-27 DIAGNOSIS — D509 Iron deficiency anemia, unspecified: Secondary | ICD-10-CM | POA: Diagnosis not present

## 2018-03-27 DIAGNOSIS — R05 Cough: Secondary | ICD-10-CM | POA: Diagnosis not present

## 2018-03-27 DIAGNOSIS — I519 Heart disease, unspecified: Secondary | ICD-10-CM | POA: Diagnosis not present

## 2018-03-27 DIAGNOSIS — Z992 Dependence on renal dialysis: Secondary | ICD-10-CM | POA: Diagnosis not present

## 2018-03-27 DIAGNOSIS — I129 Hypertensive chronic kidney disease with stage 1 through stage 4 chronic kidney disease, or unspecified chronic kidney disease: Secondary | ICD-10-CM | POA: Diagnosis not present

## 2018-03-27 DIAGNOSIS — I429 Cardiomyopathy, unspecified: Secondary | ICD-10-CM | POA: Diagnosis not present

## 2018-03-27 DIAGNOSIS — N2581 Secondary hyperparathyroidism of renal origin: Secondary | ICD-10-CM | POA: Diagnosis not present

## 2018-03-27 DIAGNOSIS — D638 Anemia in other chronic diseases classified elsewhere: Secondary | ICD-10-CM | POA: Diagnosis not present

## 2018-03-27 DIAGNOSIS — I482 Chronic atrial fibrillation: Secondary | ICD-10-CM | POA: Diagnosis not present

## 2018-03-27 DIAGNOSIS — R41841 Cognitive communication deficit: Secondary | ICD-10-CM | POA: Diagnosis not present

## 2018-03-27 DIAGNOSIS — I5042 Chronic combined systolic (congestive) and diastolic (congestive) heart failure: Secondary | ICD-10-CM | POA: Diagnosis not present

## 2018-03-27 DIAGNOSIS — R1310 Dysphagia, unspecified: Secondary | ICD-10-CM | POA: Diagnosis not present

## 2018-03-27 DIAGNOSIS — R2681 Unsteadiness on feet: Secondary | ICD-10-CM | POA: Diagnosis not present

## 2018-03-27 DIAGNOSIS — R2689 Other abnormalities of gait and mobility: Secondary | ICD-10-CM | POA: Diagnosis not present

## 2018-03-27 DIAGNOSIS — G9341 Metabolic encephalopathy: Secondary | ICD-10-CM | POA: Diagnosis not present

## 2018-03-27 DIAGNOSIS — R488 Other symbolic dysfunctions: Secondary | ICD-10-CM | POA: Diagnosis not present

## 2018-03-27 DIAGNOSIS — E875 Hyperkalemia: Secondary | ICD-10-CM | POA: Diagnosis not present

## 2018-03-27 DIAGNOSIS — G9340 Encephalopathy, unspecified: Secondary | ICD-10-CM | POA: Diagnosis not present

## 2018-03-27 DIAGNOSIS — N186 End stage renal disease: Secondary | ICD-10-CM | POA: Diagnosis not present

## 2018-03-27 DIAGNOSIS — I5033 Acute on chronic diastolic (congestive) heart failure: Secondary | ICD-10-CM | POA: Diagnosis not present

## 2018-03-27 DIAGNOSIS — M6281 Muscle weakness (generalized): Secondary | ICD-10-CM | POA: Diagnosis not present

## 2018-03-27 LAB — GLUCOSE, CAPILLARY
GLUCOSE-CAPILLARY: 50 mg/dL — AB (ref 65–99)
GLUCOSE-CAPILLARY: 82 mg/dL (ref 65–99)
GLUCOSE-CAPILLARY: 47 mg/dL — AB (ref 65–99)
Glucose-Capillary: 55 mg/dL — ABNORMAL LOW (ref 65–99)
Glucose-Capillary: 64 mg/dL — ABNORMAL LOW (ref 65–99)
Glucose-Capillary: 112 mg/dL — ABNORMAL HIGH (ref 65–99)
Glucose-Capillary: 81 mg/dL (ref 65–99)

## 2018-03-27 LAB — CBC
HEMATOCRIT: 36.3 % — AB (ref 39.0–52.0)
Hemoglobin: 12.5 g/dL — ABNORMAL LOW (ref 13.0–17.0)
MCH: 33.1 pg (ref 26.0–34.0)
MCHC: 34.4 g/dL (ref 30.0–36.0)
MCV: 96 fL (ref 78.0–100.0)
Platelets: 322 10*3/uL (ref 150–400)
RBC: 3.78 MIL/uL — ABNORMAL LOW (ref 4.22–5.81)
RDW: 13.9 % (ref 11.5–15.5)
WBC: 9.7 10*3/uL (ref 4.0–10.5)

## 2018-03-27 LAB — BASIC METABOLIC PANEL
Anion gap: 17 — ABNORMAL HIGH (ref 5–15)
BUN: 45 mg/dL — AB (ref 6–20)
CHLORIDE: 95 mmol/L — AB (ref 101–111)
CO2: 24 mmol/L (ref 22–32)
Calcium: 8.8 mg/dL — ABNORMAL LOW (ref 8.9–10.3)
Creatinine, Ser: 9.69 mg/dL — ABNORMAL HIGH (ref 0.61–1.24)
GFR calc Af Amer: 6 mL/min — ABNORMAL LOW (ref >60)
GFR calc non Af Amer: 5 mL/min — ABNORMAL LOW (ref >60)
Glucose, Bld: 72 mg/dL (ref 65–99)
POTASSIUM: 5 mmol/L (ref 3.5–5.1)
SODIUM: 136 mmol/L (ref 135–145)

## 2018-03-27 MED ORDER — HEPARIN SODIUM (PORCINE) 1000 UNIT/ML DIALYSIS: 2400.0000 [IU] | Freq: Once | INTRAMUSCULAR | Status: DC

## 2018-03-27 MED ORDER — DIVALPROEX SODIUM 500 MG PO DR TAB: 500.0000 mg | DELAYED_RELEASE_TABLET | Freq: Two times a day (BID) | ORAL | 0 refills | Status: DC

## 2018-03-27 MED ORDER — GLUCOSE 40 % PO GEL
ORAL | Status: AC
  Filled 2018-03-27: qty 1

## 2018-03-27 MED ORDER — ASPIRIN 81 MG PO TBEC: 81.0000 mg | DELAYED_RELEASE_TABLET | Freq: Every day | ORAL | 0 refills | Status: AC

## 2018-03-27 MED ORDER — DEXTROSE 50 % IV SOLN
INTRAVENOUS | Status: AC
  Administered 2018-03-27: 25 mL via INTRAVENOUS
  Filled 2018-03-27: qty 50

## 2018-03-27 MED ORDER — DEXTROSE 50 % IV SOLN
25.0000 mL | Freq: Once | INTRAVENOUS | Status: AC
  Administered 2018-03-27: 25 mL via INTRAVENOUS

## 2018-03-27 NOTE — Plan of Care (Signed)
Patient is adequate for discharge.  

## 2018-03-27 NOTE — Progress Notes (Signed)
Report called to [facility] center.  Spoke with Toki on 5 hall room 209.  Judson Roch, SW notified and she will replace call to [facility] for transport.

## 2018-03-27 NOTE — Clinical Social Work Note (Addendum)
CSW facilitated patient discharge including contacting patient family (left voicemail for daughters) and facility to confirm patient discharge plans. Clinical information faxed to facility and family agreeable with plan. CSW arranged ambulance transport via [place] to [facility]. RN to call report prior to discharge [phone]).  CSW will sign off for now as social work intervention is no longer needed. Please consult [place] again if new needs arise.  Dayton Scrape, [place] [phone]  3:37 pm Transport delayed due to low blood sugar. Per RN, patient's blood sugar back in normal limits. CSW arranged transport again.  CSW signing off.  Dayton Scrape, [place]

## 2018-03-27 NOTE — Progress Notes (Signed)
Subjective:  Seen on dialysis  Objective Vital signs in last 24 hours: Vitals:   03/27/18 1030 03/27/18 1100 03/27/18 1130 03/27/18 1203  BP: 138/76 (!) 99/53 (!) 98/55 130/87  Pulse: 63 80 74 87  Resp:    20  Temp:    (!) 97.5 F (36.4 C)  TempSrc:    Oral  SpO2:    95%  Weight:    79.9 kg (176 lb 2.4 oz)  Height:       Weight change: 0 kg (0 lb)  Physical Exam: General: Alert, on hd , NAD , oriented to [facility], Thursday  Heart: RRR, no m, r, g Lungs: Bilat coarse bs, non labored breathing  Abdomen: soft , NT,nD Extremities: no pedal edema  Dialysis Access: patent on HD LUA  AVF    OP Dialysis: tts gkc  4h 69min  84kg  2/2 bath hep 2400  lua avf  -venofer 50/wk  -mircera 75 every 2 wks  -calc 2.75  -sensipar 90 tiw  Problem/Plan: 1. ESRD - HD  TTS  2. PEA Cardiac Arrest SP Cooling Procedure  3. Acute resp failure/ pulm edema - resolved w HD  4. Delirium/ Agitation  Post Arrest speaking now ,Dysphagia diet, pt w/ long hx etoh abuse and has some cognitive difficulties at baseline 5.  Anemia of ESRD - HGB 9.6 Aranesp 40 mcg q tues  Hd Given 5/28  6. Secondary hyperparathyroidism -  Corec Ca 10.3 no po vit d on hd sec prior npo . phos 6.4  Po binder when  Pos  7. dispo - for dc to snf after HD today   Kelly Splinter MD [facility] pgr [phone]   03/27/2018, 3:00 PM         Labs: Basic Metabolic Panel: Recent Labs  Lab 03/23/18 0838 03/25/18 0817 03/26/18 1228 03/27/18 0626  NA 138 135 136 136  K 4.1 3.8 4.1 5.0  CL 99* 96* 95* 95*  CO2 24 24 26 24   GLUCOSE 72 79 92 72  BUN 79* 57* 32* 45*  CREATININE 11.98* 10.38* 7.41* 9.69*  CALCIUM 9.1 8.5* 9.0 8.8*  PHOS 6.0* 6.4*  --   --    Liver Function Tests: Recent Labs  Lab 03/23/18 0838 03/25/18 0817  ALBUMIN 2.5* 2.5*   No results for input(s): LIPASE, AMYLASE in the last 168 hours. Recent Labs  Lab 03/21/18 0326  AMMONIA 25   CBC: Recent Labs  Lab 03/21/18 0326  03/22/18 0253 03/23/18 0836 03/25/18 0817 03/27/18 0626  WBC 17.1* 15.5* 11.6* 9.2 9.7  NEUTROABS  --  10.8* 7.6  --   --   HGB 10.5* 11.4* 9.8* 9.6* 12.5*  HCT 30.9* 34.2* 29.3* 28.4* 36.3*  MCV 96.6 98.3 96.7 95.9 96.0  PLT 330 343 361 372 322   Cardiac Enzymes: No results for input(s): CKTOTAL, CKMB, CKMBINDEX, TROPONINI in the last 168 hours. CBG: Recent Labs  Lab 03/26/18 2153 03/27/18 0631 03/27/18 0654 03/27/18 0659 03/27/18 1025  GLUCAP 107* 64* 55* 50* 82     Medications: . valproate sodium Stopped (03/26/18 2156)   . dextrose      . dextrose      . chlorhexidine  15 mL Mouth Rinse BID  . Chlorhexidine Gluconate Cloth  6 each Topical Q0600  . Chlorhexidine Gluconate Cloth  6 each Topical Q0600  . darbepoetin (ARANESP) injection - DIALYSIS  40 mcg Intravenous Q Tue-HD  . famotidine  20 mg Oral Daily  . feeding  supplement (NEPRO CARB STEADY)  237 mL Oral BID BM  . folic acid  1 mg Intravenous Daily  . mouth rinse  15 mL Mouth Rinse q12n4p  . metoprolol tartrate  5 mg Intravenous Q6H  . nicotine  21 mg Transdermal Daily  . thiamine injection  100 mg Intravenous Daily

## 2018-03-27 NOTE — Progress Notes (Signed)
Patient transported to hemodialysis vis bed.

## 2018-03-27 NOTE — Progress Notes (Signed)
At 1435 checked Mr. Bolle blood sugar and reading was 47.  25 ML D 50 given and protein Nephro shake was given po.  Blood sugar rechecked and was 112.

## 2018-03-27 NOTE — Plan of Care (Signed)
Care plans reviewed and patient is progressing.  

## 2018-03-27 NOTE — Clinical Social Work Placement (Signed)
   CLINICAL SOCIAL WORK PLACEMENT  NOTE  Date:  03/27/2018  Patient Details  Name: Kenneth [name] MRN: [id number] Date of Birth: 08-08-1956  Clinical Social Work is seeking post-discharge placement for this patient at the [place] level of care (*CSW will initial, date and re-position this form in  chart as items are completed):  Yes   Patient/family provided with [place] Work Department's list of facilities offering this level of care within the geographic area requested by the patient (or if unable, by the patient's family).  Yes   Patient/family informed of their freedom to choose among providers that offer the needed level of care, that participate in Medicare, Medicaid or managed care program needed by the patient, have an available bed and are willing to accept the patient.  Yes   Patient/family informed of [facility]'s ownership interest in [facility] and [facility], as well as of the fact that they are under no obligation to receive care at these facilities.  PASRR submitted to EDS on 03/25/18     PASRR number received on 03/25/18     Existing PASRR number confirmed on       FL2 transmitted to all facilities in geographic area requested by pt/family on 03/25/18     FL2 transmitted to all facilities within larger geographic area on       Patient informed that his/her managed care company has contracts with or will negotiate with certain facilities, including the following:        Yes   Patient/family informed of bed offers received.  Patient chooses bed at [facility]     Physician recommends and patient chooses bed at      Patient to be transferred to [facility] on 03/27/18.  Patient to be transferred to facility by [facility]     Patient family notified on 03/27/18 of transfer.  Name of family member notified:  Left voicemail for daughters, Bonita Quin and Jacob Moores     PHYSICIAN Please prepare prescriptions     Additional Comment:     _______________________________________________ Candie Chroman, LCSW 03/27/2018, 1:52 PM

## 2018-03-27 NOTE — Discharge Summary (Signed)
Physician Discharge Summary  Sergey Ishler KWI:[id number] DOB: [birth year] DOA: 03/09/2018  PCP: [name], MD  Admit date: 03/09/2018 Discharge date: 03/27/2018  Admitted From: Home Disposition:  SNF   Recommendations for Outpatient Follow-up:  1. Follow up with PCP in 1 week 2. Follow up with HD T/Th/Sa 3. Follow up with Dr. [name], Cardiology, regarding echocardiogram result   Discharge Condition: Stable CODE STATUS: Full  Diet recommendations: Dysphagia 1 (puree);Honey-thick liquid Liquids provided via: Teaspoon Medication Administration: Crushed with puree Supervision: Patient able to self feed;Staff to assist with self feeding;Full supervision/cueing for compensatory strategies Compensations: Minimize environmental distractions;Slow rate;Small sips/bites;Lingual sweep for clearance of pocketing(liquids via teaspoon) Postural Changes and/or Swallow Maneuvers: Seated upright 90 degrees   Brief/Interim Summary: Kenneth [name] is a 29 year oldMw/ a Hxof ESRD on HD T/TH/Sat,EtOH abuse, chronic combined systolic and diastolic heart failure, and HTN who presented with worsening dyspnea w/ only mild respiratory distress initially appreciable in the ED. He then collapsed while getting up to use the commode, suffering a PEA arrest. During the code he received empiric treatment for hyperkalemia, shock x1 for WCT, and amiodarone. ROSC after 10 mins of ACLS followed by targeted temperature management. Following this he has continued to be encephalopathic and had AFib with RVR, since reverted to NSR. His hospitalization has been complicated by altered mental status s/p PEA arrest and required sitter for period of time. Likely there is an element of acute delirium and/or sequelae of anoxic brain injury. Previous discharge summary indicates he is at baseline oriented x2.  Discharge Diagnoses:  Principal Problem:   Cardiac arrest [facility]) Active Problems:   Chronic combined systolic and  diastolic CHF (congestive heart failure) (HCC)   Essential hypertension   ESRD on hemodialysis (HCC)   Atrial fibrillation and flutter (HCC)   Pulmonary edema   Acute respiratory failure with hypoxia (HCC)   Acute encephalopathy  Altered mental status s/p PEA cardiac arrest - Based on recent discharge summary, noting he is only oriented x2 at that time, he is actually returned mostly to baseline.Strongly suspect element ofacute delirium and/or sequelae of anoxic brain injury. - Has been sitter free > 24 hours   Dysphagia - MBS 5/29 showed mod-severe oral and pharyngeal dysphagia - Dysphagia diet ordered. Only to be fed when alert. Honey thickened liquids via tsp only.Does not appear to need cortrak.  Acute hypoxic respiratory failure - Due to pulmonary edema due to noncompliance with HD - Resolved now on room air   ESRD TTSa  - Continue HD per nephrology  Chronic diastolic CHF, HTN, cardiomyopathy  - BP/volume management with HD  AFib with RVR - Not candidate for anticoagulation due to chronic nonadherence.  - Rate is controlled today, remains normal sinus   EtOH abuse - No further need for CIWA  Anemia of chronic disease - Stable - Monitor   Discharge Instructions  Discharge Instructions    (HEART FAILURE PATIENTS) Call MD:  Anytime you have any of the following symptoms: 1) 3 pound weight gain in 24 hours or 5 pounds in 1 week 2) shortness of breath, with or without a dry hacking cough 3) swelling in the hands, feet or stomach 4) if you have to sleep on extra pillows at night in order to breathe.   Complete by:  As directed    Call MD for:  difficulty breathing, headache or visual disturbances   Complete by:  As directed    Call MD for:  extreme fatigue   Complete by:  As directed    Call MD for:  hives   Complete by:  As directed    Call MD for:  persistant dizziness or light-headedness   Complete by:  As directed    Call MD for:  persistant nausea and  vomiting   Complete by:  As directed    Call MD for:  severe uncontrolled pain   Complete by:  As directed    Call MD for:  temperature >100.4   Complete by:  As directed    Discharge instructions   Complete by:  As directed    You were cared for by a hospitalist during your hospital stay. If you have any questions about your discharge medications or the care you received while you were in the hospital after you are discharged, you can call the unit and ask to speak with the hospitalist on call if the hospitalist that took care of you is not available. Once you are discharged, your primary care physician will handle any further medical issues. Please note that NO REFILLS for any discharge medications will be authorized once you are discharged, as it is imperative that you return to your primary care physician (or establish a relationship with a primary care physician if you do not have one) for your aftercare needs so that they can reassess your need for medications and monitor your lab values.   Increase activity slowly   Complete by:  As directed      Allergies as of 03/27/2018   No Known Allergies     Medication List    STOP taking these medications   amLODipine 10 MG tablet Commonly known as:  NORVASC   lisinopril 2.5 MG tablet Commonly known as:  PRINIVIL,ZESTRIL   metoprolol tartrate 25 MG tablet Commonly known as:  LOPRESSOR     TAKE these medications   aspirin 81 MG EC tablet Take 1 tablet (81 mg total) by mouth daily.   diphenhydramine-acetaminophen 25-500 MG Tabs tablet Commonly known as:  TYLENOL PM Take 1 tablet by mouth at bedtime as needed (for sleep).   divalproex 500 MG DR tablet Commonly known as:  DEPAKOTE Take 1 tablet (500 mg total) by mouth 2 (two) times daily.   folic acid 1 MG tablet Commonly known as:  FOLVITE Take 1 tablet (1 mg total) by mouth daily.   multivitamin with minerals Tabs tablet Take 1 tablet by mouth daily.   PHOSLO 667 MG  capsule Generic drug:  calcium acetate Take 1,334-2,001 mg by mouth See admin instructions. 2,001 mg three times a day with meals and 1,334 mg two times a day with snacks   thiamine 100 MG tablet Take 1 tablet (100 mg total) by mouth daily.       Contact information for follow-up providers    [name], MD. Schedule an appointment as soon as possible for a visit in 1 week(s).   Specialty:  Family Medicine Contact information: [place] [address] [phone]        [name], [name], MD. Schedule an appointment as soon as possible for a visit in 1 week(s).   Specialty:  Cardiology Contact information: [address] [place] [address] [phone]            Contact information for after-discharge care    [facility] .   Service:  Skilled Nursing Contact information: [place] [phone]  No Known Allergies  Consultations:  Nephrology  Cardiology  PCCM   Procedures/Studies: Dg Chest 2 View  Result Date: 03/09/2018 CLINICAL DATA:  Acute onset of worsening shortness of breath and subacute onset of cough. EXAM: CHEST - 2 VIEW COMPARISON:  Chest radiograph performed 02/16/2018 FINDINGS: The lungs are well-aerated. Vascular congestion is noted. Increased interstitial markings may reflect pulmonary edema or possibly pneumonia. There is no evidence of pleural effusion or pneumothorax. The heart is normal in size; the mediastinal contour is within normal limits. No acute osseous abnormalities are seen. IMPRESSION: Vascular congestion noted. Increased interstitial markings may reflect pulmonary edema or possibly pneumonia. Electronically Signed   By: Garald Balding M.D.   On: 03/09/2018 06:13   Dg Abd 1 View  Result Date: 03/15/2018 CLINICAL DATA:  OG tube placement EXAM: ABDOMEN - 1 VIEW COMPARISON:  03/14/2018 FINDINGS: Esophageal  tube tip and side port are in the left upper quadrant overlying expected location of proximal stomach. Abdomen is relatively gasless. Vascular calcification IMPRESSION: 1. Esophageal tube tip and side port overlie the gastric fundal region 2. Relatively gasless abdomen Electronically Signed   By: Donavan Foil M.D.   On: 03/15/2018 03:37   Ct Head Wo Contrast  Result Date: 03/15/2018 CLINICAL DATA:  Initial evaluation for acute change in mental status. EXAM: CT HEAD WITHOUT CONTRAST TECHNIQUE: Contiguous axial images were obtained from the base of the skull through the vertex without intravenous contrast. COMPARISON:  Prior CT from 12/01/2017. FINDINGS: Brain: Advanced cerebral and cerebellar atrophy with chronic small vessel ischemic change. Remote acute or infarcts noted within the bilateral basal ganglia. No acute intracranial hemorrhage. No acute large vessel territory infarct. No mass lesion, midline shift or mass effect. Ventricular prominence related to global parenchymal volume loss without hydrocephalus. No extra-axial fluid collection. Vascular: No hyperdense vessel. Extensive calcified intracranial atherosclerosis. Skull: Scalp soft tissues demonstrate no acute abnormality. Calvarium intact. Sinuses/Orbits: Globes and orbital soft tissues within normal limits. Mucosal thickening with air-fluid level within the right maxillary sinus. Scattered mucosal thickening throughout the remainder of the paranasal sinuses. Left mastoid effusion noted. Fluid seen layering within the nasopharynx. Other: None. IMPRESSION: 1. No acute intracranial abnormality. 2. Advanced cerebral and cerebellar atrophy with chronic small vessel ischemic disease with remote bilateral lacunar infarcts involving the basal ganglia. Electronically Signed   By: Jeannine Boga M.D.   On: 03/15/2018 02:57   Dg Chest Port 1 View  Result Date: 03/21/2018 CLINICAL DATA:  Encounter for respiratory failure. EXAM: PORTABLE CHEST 1 VIEW  COMPARISON:  None. FINDINGS: Heart size is normal. There is no edema or effusion. No focal airspace disease is present. Feeding tube is in place. Lung volumes are low. IMPRESSION: No active disease. Electronically Signed   By: San Morelle M.D.   On: 03/21/2018 08:40   Dg Chest Port 1 View  Result Date: 03/17/2018 CLINICAL DATA:  Decrease in oxygen saturation with initiation of tube feeds. EXAM: PORTABLE CHEST 1 VIEW COMPARISON:  03/16/2018 FINDINGS: Stable cardiac enlargement. Stable positioning of central line. Feeding tube is been placed which extends below the diaphragm. Lungs show improved aeration at the left base. No overt airspace infiltrate or pulmonary edema. No pleural effusions or pneumothorax. IMPRESSION: Improved aeration at the left lung base.  No acute findings. Electronically Signed   By: Aletta Edouard M.D.   On: 03/17/2018 20:04   Dg Chest Port 1 View  Result Date: 03/16/2018 CLINICAL DATA:  Acute respiratory failure, extubated yesterday, history of cardiac arrest EXAM:  PORTABLE CHEST 1 VIEW COMPARISON:  03/12/2018 FINDINGS: Mild patchy left lower lobe opacity, likely atelectasis. Pulmonary vascular congestion without frank interstitial edema. No pleural effusion or pneumothorax. Cardiomegaly. Right IJ venous catheter terminates in the lower SVC. IMPRESSION: Mild patchy left lower lobe opacity, likely atelectasis. Electronically Signed   By: Julian Hy M.D.   On: 03/16/2018 09:15   Dg Chest Port 1 View  Result Date: 03/12/2018 CLINICAL DATA:  Intubation.  Ileus. EXAM: PORTABLE CHEST 1 VIEW COMPARISON:  03/10/2018. FINDINGS: Endotracheal tube and NG tube in stable position. Right IJ line stable position. Stable cardiomegaly. Interim improvement of right base infiltrate. Mild bibasilar atelectasis. IMPRESSION: 1.  Lines and tubes in stable position. 2. Interim partial clearing of right base infiltrate. Bibasilar atelectasis. Stable cardiomegaly. Electronically Signed    By: Marcello Moores  Register   On: 03/12/2018 10:23   Dg Chest Port 1 View  Result Date: 03/10/2018 CLINICAL DATA:  Shortness of breath EXAM: PORTABLE CHEST 1 VIEW COMPARISON:  03/09/2018 FINDINGS: Endotracheal tube is 6 cm above the carina. NG tube enters the stomach. Right central line is unchanged. Cardiomegaly. Increasing right perihilar and lower lobe airspace opacities/pneumonia. No confluent opacity on the left. Possible small right effusion. IMPRESSION: Worsening right lower lobe consolidation concerning for pneumonia. Small right effusion. Electronically Signed   By: Rolm Baptise M.D.   On: 03/10/2018 10:15   Dg Chest Port 1 View  Result Date: 03/09/2018 CLINICAL DATA:  Central line placement EXAM: PORTABLE CHEST 1 VIEW COMPARISON:  03/09/2018 FINDINGS: Right jugular central venous catheter tip in the SVC. Endotracheal tube in good position. NG tube in place. No pneumothorax Improvement in diffuse bilateral edema since earlier today. Bibasilar atelectasis. No significant effusion IMPRESSION: Satisfactory central line placement Improvement in bilateral pulmonary edema. Bibasilar atelectasis Electronically Signed   By: Franchot Gallo M.D.   On: 03/09/2018 12:43   Dg Chest Port 1 View  Result Date: 03/09/2018 CLINICAL DATA:  Hypoxia EXAM: PORTABLE CHEST 1 VIEW COMPARISON:  Study obtained earlier in the day FINDINGS: Endotracheal tube tip is 2.3 cm above the carina. Nasogastric tube tip and side port are below the diaphragm. No pneumothorax evident. There is mild interstitial edema. No consolidation. Heart is mildly enlarged with pulmonary venous hypertension. There is aortic atherosclerosis. No adenopathy. There is degenerative change in the left shoulder. IMPRESSION: Tube positions as described without pneumothorax. Pulmonary vascular congestion with mild interstitial edema. Question a degree of congestive heart failure. No consolidation appreciable. Electronically Signed   By: Lowella Grip III M.D.    On: 03/09/2018 07:50   Dg Abd Portable 1v  Result Date: 03/17/2018 CLINICAL DATA:  Feeding tube placement. EXAM: PORTABLE ABDOMEN - 1 VIEW COMPARISON:  None. FINDINGS: Long feeding tube present extending through the stomach and duodenal C loop with the tip just beyond the ligament of Treitz in the proximal jejunum. Underlying bowel gas pattern is unremarkable. IMPRESSION: Feeding tube tip extends beyond the ligament of Treitz into the proximal jejunum. Electronically Signed   By: Aletta Edouard M.D.   On: 03/17/2018 20:02   Dg Abd Portable 1v  Result Date: 03/15/2018 CLINICAL DATA:  Vomiting EXAM: PORTABLE ABDOMEN - 1 VIEW COMPARISON:  03/12/2018 FINDINGS: Esophageal tube tip is in the left upper quadrant. Nonspecific diffuse decreased bowel gas. Vascular calcifications. IMPRESSION: Nonspecific diffuse decreased bowel gas. Electronically Signed   By: Donavan Foil M.D.   On: 03/15/2018 03:37   Dg Abd Portable 1v  Result Date: 03/12/2018 CLINICAL DATA:  Intubation.  Ileus. EXAM: PORTABLE ABDOMEN - 1 VIEW COMPARISON:  Chest x-ray 03/12/2018. FINDINGS: NG tube noted coiled stomach. No bowel distention or free air. Aortoiliac atherosclerotic vascular calcification. No acute bony abnormality. IMPRESSION: 1.  NG tube noted coiled stomach.  No bowel distention. 2.  Aortoiliac atherosclerotic vascular disease. Electronically Signed   By: Marcello Moores  Register   On: 03/12/2018 10:22   Dg Swallowing Func-speech Pathology  Result Date: 03/24/2018 Objective Swallowing Evaluation: Type of Study: MBS-Modified Barium Swallow Study  Patient Details Name: Kenneth [name] MRN: [id number] Date of Birth: 02-May-1956 Today's Date: 03/24/2018 Time: SLP Start Time (ACUTE ONLY): 1015 -SLP Stop Time (ACUTE ONLY): 1035 SLP Time Calculation (min) (ACUTE ONLY): 20 min Past Medical History: Past Medical History: Diagnosis Date . Acute CHF ([facility]) 01/2018 . Cardiomyopathy secondary   likely related to HTN heart disease; possibly ETOH related  as well . Chronic combined systolic and diastolic heart failure (HCC)   Echocardiogram 09/22/11: Moderate LVH, EF 25-42%, grade 3 diastolic dysfunction, mild MR, moderate to severe LAE, mild RVE, mild to moderate TR, small to moderate pericardial effusion . ESRD (end stage renal disease) on dialysis [facility])   due to hypertensive nephrosclerosis . History of alcohol abuse  . Hypertension  Past Surgical History: Past Surgical History: Procedure Laterality Date . AV FISTULA PLACEMENT Left 05/14/2016  Procedure: LEFT ARM BASILIC VEIN TRANSPOSITION;  Surgeon: Rosetta Posner, MD;  Location: [facility];  Service: Vascular;  Laterality: Left; . PERIPHERAL VASCULAR CATHETERIZATION N/A 05/13/2016  Procedure: Dialysis/Perma Catheter Insertion;  Surgeon: Serafina Mitchell, MD;  Location: [facility];  Service: Cardiovascular;  Laterality: N/A; HPI: 62 y/o male admitted on 5/14 after having a cardiac arrest in the setting of a PEA arrest and wide complex tachycardia.  Had 10 minutes of CPR. ETT 5/14-5/20. CT Head showed no acute changes but advanced cerebral and cerebellar atrophy, remote bilateral lacunar infarcts involving the basal ganglia. PMH:  ERSD, sCHF, EtOH abuse, HTN. Pt had a BSE in July 2017 recommending Dys 3 diet and thin liquids by cup due to cognitively-based dysphagia. He was advanced to regular textures and thin liquids by cup or straw prior to d/c.  CXR negative.  MBS indicated - pt has pulled out his feeding tube and is alert for eval per RN.   Subjective: Pt seen in radiology for MBS Assessment / Plan / Recommendation CHL IP CLINICAL IMPRESSIONS 03/24/2018 Clinical Impression Pt presents with lethargy, requiring almost constant stimulation with cold wet cloth to maintain alertness. Pt exhibits moderate-severe oral and pharyngeal dysphagia, characterized as follows: ORALLY, pt demonstrates no bolus formation and poor propulsion on all consistencies; rather, boluses are noted to spill prematurely over the tongue base  to the level of the vallecula where the PHARYNGEAL swallow reflex is delayed. Vallecular residue is noted after the swallow, due to decreased tongue base retraction. Trace penetration is noted during the swallow on nectar and honey thick liquids, however, penetrate is not aspirated. There was no cough or throat clear noted in response to penetration event. Solids requiring mastication were not given during this evaluation, due to poor bolus manipulation seen on other consistencies. At this time, recommend  puree solids with honey thick liquids VIA TSP only. PO intake should be given ONLY WHEN PT IS FULLY ALERT, upright, and appropriate for puree and honey thick liquid trials. Full supervision is recommended to insure pt has fully cleared oral cavity prior to taking next bolus. Safe swallow precautions were sent back to pt room with  transport. SLP will follow up at bedside to assess diet tolerance and provide education regarding safe swallow precautions, and the importance of adherence to them. SLP Visit Diagnosis Dysphagia, oropharyngeal phase (R13.12) Impact on safety and function Moderate aspiration risk;Severe aspiration risk;Risk for inadequate nutrition/hydration   CHL IP TREATMENT RECOMMENDATION 03/24/2018 Treatment Recommendations Therapy as outlined in treatment plan below   Prognosis 03/24/2018 Prognosis for Safe Diet Advancement Fair Barriers to Reach Goals Cognitive deficits CHL IP DIET RECOMMENDATION 03/24/2018 SLP Diet Recommendations Dysphagia 1 (Puree) solids;Honey thick liquids Liquid Administration via Spoon Medication Administration Crushed with puree Compensations Minimize environmental distractions;Small sips/bites;Slow rate Postural Changes Remain semi-upright after after feeds/meals (Comment);Seated upright at 90 degrees   CHL IP OTHER RECOMMENDATIONS 03/24/2018 Oral Care Recommendations Oral care QID Other Recommendations Have oral suction available   CHL IP FOLLOW UP RECOMMENDATIONS 03/24/2018  Follow up Recommendations Skilled Nursing facility   [facility] IP FREQUENCY AND DURATION 03/24/2018 Speech Therapy Frequency (ACUTE ONLY) min 2x/week Treatment Duration 1 week;2 weeks      CHL IP ORAL PHASE 03/24/2018 Oral Phase Impaired Oral - Honey Teaspoon Reduced posterior propulsion;Decreased bolus cohesion;Delayed oral transit;Weak lingual manipulation;Holding of bolus;Premature spillage;Piecemeal swallowing Oral - Honey Cup/straw Weak lingual manipulation;Delayed oral transit;Holding of bolus;Reduced posterior propulsion;Decreased bolus cohesion;Premature spillage;Piecemeal swallowing Oral - Nectar Teaspoon Reduced posterior propulsion;Holding of bolus;Weak lingual manipulation;Delayed oral transit;Decreased bolus cohesion;Piecemeal swallowing Oral - Puree Piecemeal swallowing;Weak lingual manipulation;Reduced posterior propulsion;Holding of bolus;Delayed oral transit;Decreased bolus cohesion;Premature spillage  CHL IP PHARYNGEAL PHASE 03/24/2018 Pharyngeal Phase Impaired Pharyngeal- Honey Cup Delayed swallow initiation-vallecula;Pharyngeal residue - valleculae;Penetration/Aspiration during swallow;Reduced airway/laryngeal closure;Reduced tongue base retraction Pharyngeal Material enters airway, remains ABOVE vocal cords then ejected out;Material does not enter airway Pharyngeal- Nectar Teaspoon Delayed swallow initiation-vallecula;Pharyngeal residue - valleculae;Penetration/Aspiration during swallow;Reduced airway/laryngeal closure;Reduced tongue base retraction Pharyngeal Material enters airway, remains ABOVE vocal cords and not ejected out Pharyngeal- Puree Delayed swallow initiation-vallecula;Pharyngeal residue - valleculae;Reduced tongue base retraction  CHL IP CERVICAL ESOPHAGEAL PHASE 03/24/2018 Cervical Esophageal Phase [facility] Celia B. Quentin Ore [facility], CCC-SLP Speech Language Pathologist [phone] Shonna Chock 03/24/2018, 11:38 AM               EEG 5/14 Impression:  This EEG is an abnormal due to background  suppression, decreased amplitude and moderate to severe slowing of electrocerebral activity.  This can be found in the setting of anoxic/ischemic injury, toxic/metabolic encephalopathies or due to sedating medication.  There was no epileptiform activity recorded.  A repeat EEG off of sedation may be of value if seizure is among the list of differential diagnoses.    Echo 5/14 Study Conclusions  - Left ventricle: The cavity size was normal. There was severe   concentric hypertrophy. Systolic function was normal. Wall motion   was normal; there were no regional wall motion abnormalities.   Features are consistent with a pseudonormal left ventricular   filling pattern, with concomitant abnormal relaxation and   increased filling pressure (grade 2 diastolic dysfunction).   Doppler parameters are consistent with elevated ventricular   end-diastolic filling pressure. - Aortic valve: Probably trileaflet; moderately thickened,   moderately calcified leaflets. - Mitral valve: Calcified annulus. Moderately thickened, mildly   calcified leaflets . There was mild regurgitation. - Left atrium: The atrium was mildly dilated. - Right ventricle: The cavity size was moderately dilated. Wall   thickness was normal. Systolic function was moderately reduced. - Right atrium: The atrium was normal in size. - Tricuspid valve: There was mild regurgitation. - Pulmonary arteries: Systolic pressure was  within the normal   range. - Inferior vena cava: The vessel was normal in size. - Pericardium, extracardiac: There was no pericardial effusion.  Impressions:  - Severe concentric LVH, LVEF 55-60%. Grade 2 diastolic   dysfunction.   Moderately decreased RV systolic function.   Thickened aortic and mitral valves.   Consider evaluation for cardiac amyloidosis including PYP scan   (nuclear scan for evaluation of TTR amyloidosis).   Discharge Exam: Vitals:   03/27/18 0815 03/27/18 0830  BP: (!) 165/74 (!)  144/78  Pulse: (!) 59 63  Resp:    Temp:    SpO2:      General: Pt is alert to voice, does not engage in conversation today  Cardiovascular: RRR, S1/S2 +, no rubs, no gallops Respiratory: CTA bilaterally, no wheezing, no rhonchi Abdominal: Soft, NT, ND, bowel sounds + Extremities: no edema, no cyanosis    The results of significant diagnostics from this hospitalization (including imaging, microbiology, ancillary and laboratory) are listed below for reference.     Microbiology: No results found for this or any previous visit (from the past 240 hour(s)).   Labs: BNP (last 3 results) Recent Labs    12/01/17 0921 01/26/18 0114 02/16/18 0648  BNP 1,765.6* 1,720.1* 9,390.3*   Basic Metabolic Panel: Recent Labs  Lab 03/21/18 0326 03/22/18 0253 03/23/18 0092 03/25/18 0817 03/26/18 1228 03/27/18 0626  NA 134* 138 138 135 136 136  K 3.9 3.8 4.1 3.8 4.1 5.0  CL 95* 98* 99* 96* 95* 95*  CO2 27 26 24 24 26 24   GLUCOSE 97 82 72 79 92 72  BUN 32* 54* 79* 57* 32* 45*  CREATININE 5.81* 8.44* 11.98* 10.38* 7.41* 9.69*  CALCIUM 8.9 9.5 9.1 8.5* 9.0 8.8*  MG 2.1 2.5*  --   --   --   --   PHOS  --   --  6.0* 6.4*  --   --    Liver Function Tests: Recent Labs  Lab 03/23/18 0838 03/25/18 0817  ALBUMIN 2.5* 2.5*   No results for input(s): LIPASE, AMYLASE in the last 168 hours. Recent Labs  Lab 03/21/18 0326  AMMONIA 25   CBC: Recent Labs  Lab 03/21/18 0326 03/22/18 0253 03/23/18 0836 03/25/18 0817 03/27/18 0626  WBC 17.1* 15.5* 11.6* 9.2 9.7  NEUTROABS  --  10.8* 7.6  --   --   HGB 10.5* 11.4* 9.8* 9.6* 12.5*  HCT 30.9* 34.2* 29.3* 28.4* 36.3*  MCV 96.6 98.3 96.7 95.9 96.0  PLT 330 343 361 372 322   Cardiac Enzymes: No results for input(s): CKTOTAL, CKMB, CKMBINDEX, TROPONINI in the last 168 hours. BNP: Invalid input(s): POCBNP CBG: Recent Labs  Lab 03/26/18 1819 03/26/18 2153 03/27/18 0631 03/27/18 0654 03/27/18 0659  GLUCAP 89 107* 64* 55* 50*    D-Dimer No results for input(s): DDIMER in the last 72 hours. Hgb A1c No results for input(s): HGBA1C in the last 72 hours. Lipid Profile No results for input(s): CHOL, HDL, LDLCALC, TRIG, CHOLHDL, LDLDIRECT in the last 72 hours. Thyroid function studies No results for input(s): TSH, T4TOTAL, T3FREE, THYROIDAB in the last 72 hours.  Invalid input(s): FREET3 Anemia work up No results for input(s): VITAMINB12, FOLATE, FERRITIN, TIBC, IRON, RETICCTPCT in the last 72 hours. Urinalysis    Component Value Date/Time   COLORURINE YELLOW 05/12/2016 0834   APPEARANCEUR CLOUDY (A) 05/12/2016 0834   LABSPEC 1.014 05/12/2016 0834   PHURINE 5.0 05/12/2016 0834   GLUCOSEU NEGATIVE 05/12/2016 0834   HGBUR  MODERATE (A) 05/12/2016 0834   BILIRUBINUR NEGATIVE 05/12/2016 0834   KETONESUR NEGATIVE 05/12/2016 0834   PROTEINUR 100 (A) 05/12/2016 0834   UROBILINOGEN 0.2 12/09/2013 0013   NITRITE NEGATIVE 05/12/2016 0834   LEUKOCYTESUR NEGATIVE 05/12/2016 0834   Sepsis Labs Invalid input(s): PROCALCITONIN,  WBC,  LACTICIDVEN Microbiology No results found for this or any previous visit (from the past 240 hour(s)).   Patient was seen and examined on the day of discharge and was found to be in stable condition. Time coordinating discharge: 35 minutes including assessment and coordination of care, as well as examination of the patient.   Attempted to reach daughter via phone without success.   SIGNED:  Dessa Phi, DO [facility] Pager [phone]  If 7PM-7AM, please contact night-coverage [email or web address] Password [facility] 03/27/2018, 8:46 AM

## 2018-03-27 NOTE — Progress Notes (Signed)
Kenneth [name] was discharged per [facility] to [facility] via stretcher.  VSS.  Blood sugar 81.  Fed dinner prior to leaving.

## 2018-03-30 DIAGNOSIS — I4891 Unspecified atrial fibrillation: Secondary | ICD-10-CM | POA: Diagnosis not present

## 2018-03-30 DIAGNOSIS — D631 Anemia in chronic kidney disease: Secondary | ICD-10-CM | POA: Diagnosis not present

## 2018-03-30 DIAGNOSIS — I519 Heart disease, unspecified: Secondary | ICD-10-CM | POA: Diagnosis not present

## 2018-03-30 DIAGNOSIS — G9340 Encephalopathy, unspecified: Secondary | ICD-10-CM | POA: Diagnosis not present

## 2018-03-30 DIAGNOSIS — D638 Anemia in other chronic diseases classified elsewhere: Secondary | ICD-10-CM | POA: Diagnosis not present

## 2018-03-30 DIAGNOSIS — I469 Cardiac arrest, cause unspecified: Secondary | ICD-10-CM | POA: Diagnosis not present

## 2018-03-30 DIAGNOSIS — N186 End stage renal disease: Secondary | ICD-10-CM | POA: Diagnosis not present

## 2018-03-30 DIAGNOSIS — N2581 Secondary hyperparathyroidism of renal origin: Secondary | ICD-10-CM | POA: Diagnosis not present

## 2018-03-30 DIAGNOSIS — D509 Iron deficiency anemia, unspecified: Secondary | ICD-10-CM | POA: Diagnosis not present

## 2018-04-01 DIAGNOSIS — N2581 Secondary hyperparathyroidism of renal origin: Secondary | ICD-10-CM | POA: Diagnosis not present

## 2018-04-01 DIAGNOSIS — N186 End stage renal disease: Secondary | ICD-10-CM | POA: Diagnosis not present

## 2018-04-01 DIAGNOSIS — D509 Iron deficiency anemia, unspecified: Secondary | ICD-10-CM | POA: Diagnosis not present

## 2018-04-01 DIAGNOSIS — D631 Anemia in chronic kidney disease: Secondary | ICD-10-CM | POA: Diagnosis not present

## 2018-04-03 DIAGNOSIS — N2581 Secondary hyperparathyroidism of renal origin: Secondary | ICD-10-CM | POA: Diagnosis not present

## 2018-04-03 DIAGNOSIS — N186 End stage renal disease: Secondary | ICD-10-CM | POA: Diagnosis not present

## 2018-04-03 DIAGNOSIS — D509 Iron deficiency anemia, unspecified: Secondary | ICD-10-CM | POA: Diagnosis not present

## 2018-04-03 DIAGNOSIS — D631 Anemia in chronic kidney disease: Secondary | ICD-10-CM | POA: Diagnosis not present

## 2018-04-06 DIAGNOSIS — D509 Iron deficiency anemia, unspecified: Secondary | ICD-10-CM | POA: Diagnosis not present

## 2018-04-06 DIAGNOSIS — D631 Anemia in chronic kidney disease: Secondary | ICD-10-CM | POA: Diagnosis not present

## 2018-04-06 DIAGNOSIS — N186 End stage renal disease: Secondary | ICD-10-CM | POA: Diagnosis not present

## 2018-04-06 DIAGNOSIS — N2581 Secondary hyperparathyroidism of renal origin: Secondary | ICD-10-CM | POA: Diagnosis not present

## 2018-04-08 DIAGNOSIS — D509 Iron deficiency anemia, unspecified: Secondary | ICD-10-CM | POA: Diagnosis not present

## 2018-04-08 DIAGNOSIS — N2581 Secondary hyperparathyroidism of renal origin: Secondary | ICD-10-CM | POA: Diagnosis not present

## 2018-04-08 DIAGNOSIS — N186 End stage renal disease: Secondary | ICD-10-CM | POA: Diagnosis not present

## 2018-04-08 DIAGNOSIS — D631 Anemia in chronic kidney disease: Secondary | ICD-10-CM | POA: Diagnosis not present

## 2018-04-09 ENCOUNTER — Other Ambulatory Visit (HOSPITAL_COMMUNITY): Payer: Self-pay | Admitting: Internal Medicine

## 2018-04-09 DIAGNOSIS — R1310 Dysphagia, unspecified: Secondary | ICD-10-CM

## 2018-04-10 DIAGNOSIS — D631 Anemia in chronic kidney disease: Secondary | ICD-10-CM | POA: Diagnosis not present

## 2018-04-10 DIAGNOSIS — N2581 Secondary hyperparathyroidism of renal origin: Secondary | ICD-10-CM | POA: Diagnosis not present

## 2018-04-10 DIAGNOSIS — N186 End stage renal disease: Secondary | ICD-10-CM | POA: Diagnosis not present

## 2018-04-10 DIAGNOSIS — D509 Iron deficiency anemia, unspecified: Secondary | ICD-10-CM | POA: Diagnosis not present

## 2018-04-13 DIAGNOSIS — D509 Iron deficiency anemia, unspecified: Secondary | ICD-10-CM | POA: Diagnosis not present

## 2018-04-13 DIAGNOSIS — N186 End stage renal disease: Secondary | ICD-10-CM | POA: Diagnosis not present

## 2018-04-13 DIAGNOSIS — N2581 Secondary hyperparathyroidism of renal origin: Secondary | ICD-10-CM | POA: Diagnosis not present

## 2018-04-13 DIAGNOSIS — D631 Anemia in chronic kidney disease: Secondary | ICD-10-CM | POA: Diagnosis not present

## 2018-04-15 DIAGNOSIS — D509 Iron deficiency anemia, unspecified: Secondary | ICD-10-CM | POA: Diagnosis not present

## 2018-04-15 DIAGNOSIS — N186 End stage renal disease: Secondary | ICD-10-CM | POA: Diagnosis not present

## 2018-04-15 DIAGNOSIS — N2581 Secondary hyperparathyroidism of renal origin: Secondary | ICD-10-CM | POA: Diagnosis not present

## 2018-04-15 DIAGNOSIS — D631 Anemia in chronic kidney disease: Secondary | ICD-10-CM | POA: Diagnosis not present

## 2018-04-17 DIAGNOSIS — D509 Iron deficiency anemia, unspecified: Secondary | ICD-10-CM | POA: Diagnosis not present

## 2018-04-17 DIAGNOSIS — N186 End stage renal disease: Secondary | ICD-10-CM | POA: Diagnosis not present

## 2018-04-17 DIAGNOSIS — N2581 Secondary hyperparathyroidism of renal origin: Secondary | ICD-10-CM | POA: Diagnosis not present

## 2018-04-17 DIAGNOSIS — D631 Anemia in chronic kidney disease: Secondary | ICD-10-CM | POA: Diagnosis not present

## 2018-04-19 ENCOUNTER — Ambulatory Visit (HOSPITAL_COMMUNITY)
Admission: RE | Admit: 2018-04-19 | Discharge: 2018-04-19 | Disposition: A | Discharge status: Home / Self Care | Payer: No Typology Code available for payment source | Source: Ambulatory Visit | Attending: Internal Medicine | Admitting: Internal Medicine

## 2018-04-19 DIAGNOSIS — R1310 Dysphagia, unspecified: Secondary | ICD-10-CM | POA: Diagnosis not present

## 2018-04-19 DIAGNOSIS — N186 End stage renal disease: Secondary | ICD-10-CM | POA: Diagnosis not present

## 2018-04-19 DIAGNOSIS — I469 Cardiac arrest, cause unspecified: Secondary | ICD-10-CM | POA: Insufficient documentation

## 2018-04-19 DIAGNOSIS — I429 Cardiomyopathy, unspecified: Secondary | ICD-10-CM | POA: Insufficient documentation

## 2018-04-19 DIAGNOSIS — I132 Hypertensive heart and chronic kidney disease with heart failure and with stage 5 chronic kidney disease, or end stage renal disease: Secondary | ICD-10-CM | POA: Diagnosis not present

## 2018-04-19 DIAGNOSIS — Z992 Dependence on renal dialysis: Secondary | ICD-10-CM | POA: Diagnosis not present

## 2018-04-19 DIAGNOSIS — R1312 Dysphagia, oropharyngeal phase: Secondary | ICD-10-CM | POA: Insufficient documentation

## 2018-04-19 DIAGNOSIS — R05 Cough: Secondary | ICD-10-CM | POA: Diagnosis not present

## 2018-04-19 DIAGNOSIS — I5042 Chronic combined systolic (congestive) and diastolic (congestive) heart failure: Secondary | ICD-10-CM | POA: Diagnosis not present

## 2018-04-19 NOTE — Progress Notes (Signed)
Modified Barium Swallow Progress Note  Patient Details  Name: Kenneth [name] MRN: [id number] Date of Birth: 07-Aug-1956  Today's Date: 04/19/2018  Modified Barium Swallow completed.  Full report located under Chart Review in the Imaging Section.  Brief recommendations include the following:  Clinical Impression  Pt demonstrates excellent resolution of dysphagia following acute illness. Pt now with only mild delay in swallow initiation with large sips of thin liquids, which significantly improved with further trials during exam. There was one instance of trace sensed aspiration before the swallow with early sip. Pt was given full container of thin barium over the course of the study and challenged to consume at a rapid rate, via cup and straw and with mixed consistency and did not repeat aspiration event. Pt did noteably improve timing of swallow with cues for a small sip and brief oral hold. mastication WNL. Would suggest upgrade to regular thin with f/u from SLP regarding strategies and precautions.    Swallow Evaluation Recommendations       SLP Diet Recommendations: Regular solids;Thin liquid   Liquid Administration via: Cup;Straw   Medication Administration: Whole meds with liquid   Supervision: Patient able to self feed   Compensations: Slow rate;Small sips/bites   Postural Changes: Seated upright at 90 degrees            Balen Woolum, Katherene Ponto 04/19/2018,2:09 PM

## 2018-04-20 DIAGNOSIS — D509 Iron deficiency anemia, unspecified: Secondary | ICD-10-CM | POA: Diagnosis not present

## 2018-04-20 DIAGNOSIS — N186 End stage renal disease: Secondary | ICD-10-CM | POA: Diagnosis not present

## 2018-04-20 DIAGNOSIS — D631 Anemia in chronic kidney disease: Secondary | ICD-10-CM | POA: Diagnosis not present

## 2018-04-20 DIAGNOSIS — N2581 Secondary hyperparathyroidism of renal origin: Secondary | ICD-10-CM | POA: Diagnosis not present

## 2018-04-22 DIAGNOSIS — N186 End stage renal disease: Secondary | ICD-10-CM | POA: Diagnosis not present

## 2018-04-22 DIAGNOSIS — D631 Anemia in chronic kidney disease: Secondary | ICD-10-CM | POA: Diagnosis not present

## 2018-04-22 DIAGNOSIS — D509 Iron deficiency anemia, unspecified: Secondary | ICD-10-CM | POA: Diagnosis not present

## 2018-04-22 DIAGNOSIS — N2581 Secondary hyperparathyroidism of renal origin: Secondary | ICD-10-CM | POA: Diagnosis not present

## 2018-04-24 DIAGNOSIS — N186 End stage renal disease: Secondary | ICD-10-CM | POA: Diagnosis not present

## 2018-04-24 DIAGNOSIS — D631 Anemia in chronic kidney disease: Secondary | ICD-10-CM | POA: Diagnosis not present

## 2018-04-24 DIAGNOSIS — N2581 Secondary hyperparathyroidism of renal origin: Secondary | ICD-10-CM | POA: Diagnosis not present

## 2018-04-24 DIAGNOSIS — D509 Iron deficiency anemia, unspecified: Secondary | ICD-10-CM | POA: Diagnosis not present

## 2018-04-26 DIAGNOSIS — I129 Hypertensive chronic kidney disease with stage 1 through stage 4 chronic kidney disease, or unspecified chronic kidney disease: Secondary | ICD-10-CM | POA: Diagnosis not present

## 2018-04-26 DIAGNOSIS — N186 End stage renal disease: Secondary | ICD-10-CM | POA: Diagnosis not present

## 2018-04-26 DIAGNOSIS — Z992 Dependence on renal dialysis: Secondary | ICD-10-CM | POA: Diagnosis not present

## 2018-04-27 DIAGNOSIS — D631 Anemia in chronic kidney disease: Secondary | ICD-10-CM | POA: Diagnosis not present

## 2018-04-27 DIAGNOSIS — D509 Iron deficiency anemia, unspecified: Secondary | ICD-10-CM | POA: Diagnosis not present

## 2018-04-27 DIAGNOSIS — N2581 Secondary hyperparathyroidism of renal origin: Secondary | ICD-10-CM | POA: Diagnosis not present

## 2018-04-27 DIAGNOSIS — N186 End stage renal disease: Secondary | ICD-10-CM | POA: Diagnosis not present

## 2018-04-28 ENCOUNTER — Encounter: Payer: Self-pay | Admitting: Family Medicine

## 2018-04-28 ENCOUNTER — Ambulatory Visit (INDEPENDENT_AMBULATORY_CARE_PROVIDER_SITE_OTHER): Payer: Medicare Other | Admitting: Family Medicine

## 2018-04-28 VITALS — BP 148/90 | HR 88 | Temp 98.5°F | Wt 189.0 lb

## 2018-04-28 DIAGNOSIS — I10 Essential (primary) hypertension: Secondary | ICD-10-CM | POA: Diagnosis not present

## 2018-04-28 DIAGNOSIS — Z992 Dependence on renal dialysis: Secondary | ICD-10-CM

## 2018-04-28 DIAGNOSIS — Z7689 Persons encountering health services in other specified circumstances: Secondary | ICD-10-CM

## 2018-04-28 DIAGNOSIS — I5042 Chronic combined systolic (congestive) and diastolic (congestive) heart failure: Secondary | ICD-10-CM

## 2018-04-28 DIAGNOSIS — N186 End stage renal disease: Secondary | ICD-10-CM

## 2018-04-28 NOTE — Progress Notes (Signed)
Subjective:    Patient ID: Kenneth [name], male    DOB: 09/07/1956, 62 y.o.   MRN: [id number]  No chief complaint on file.   HPI Patient was seen today to est care/HFU.  Pt is accompanied by his daughter.  Most of the hx provided by daughter.  PMH sig for HTN, ESRD on HD (T,Th, Sa), CHF.    Pt was hospitalized on 5/14-03/27/18 for dyspnea and mild resp distress.  Pt then collapsed 2/2 PEA.  CPR performed, shock x 1 for WCT, and amiodarone given.  ROSC after 10 mins.  Pt then had afib with RVR which converted to NSR.  Pt was encephalopathic.  Pt was not a candidate for  anticoagulation d/t chronic nonadherence.  Pt was d/c to a SNF.  Pt was just d/c from the SNF on Monday.  Pt denies any complaint.  Pt seems mildly confused about being in clinic today.  Pt endorses HD on T,Th, Sa.  Pt denies current tobacco use. Per daughter she has limited his drinking to a beer once or twice/wk.  Pt has not made a f/u appt with Cardiology.  Past Medical History:  Diagnosis Date  . Acute CHF ([place]) 01/2018  . Cardiomyopathy secondary    likely related to HTN heart disease; possibly ETOH related as well  . Chronic combined systolic and diastolic heart failure (HCC)    Echocardiogram 09/22/11: Moderate LVH, EF 41-28%, grade 3 diastolic dysfunction, mild MR, moderate to severe LAE, mild RVE, mild to moderate TR, small to moderate pericardial effusion  . ESRD (end stage renal disease) on dialysis [facility])    due to hypertensive nephrosclerosis  . History of alcohol abuse   . Hypertension     No Known Allergies  ROS General: Denies fever, chills, night sweats, changes in weight, changes in appetite HEENT: Denies headaches, ear pain, changes in vision, rhinorrhea, sore throat CV: Denies CP, palpitations, SOB, orthopnea Pulm: Denies SOB, cough, wheezing GI: Denies abdominal pain, nausea, vomiting, diarrhea, constipation GU: Denies dysuria, hematuria, frequency, vaginal discharge Msk: Denies muscle cramps, joint  pains Neuro: Denies weakness, numbness, tingling Skin: Denies rashes, bruising Psych: Denies depression, anxiety, hallucinations     Objective:    Blood pressure (!) 148/90, pulse 88, temperature 98.5 F (36.9 C), temperature source Oral, weight 189 lb (85.7 kg), SpO2 94 %.   Gen. Pleasant, well-nourished, in no distress, pt seems distant, quiet during most of the visit. HEENT: [place]/AT, face symmetric, no scleral icterus, PERRLA, EOMI, nares patent without drainage, pharynx without erythema or exudate. Lungs: no accessory muscle use, CTAB, no wheezes or rales Cardiovascular: RRR, no m/r/g, no peripheral edema.  AV fistula in LUE with audible bruit. Neuro:  A&Ox3, CN II-XII intact, normal gait Skin:  Warm, no lesions/ rash   Wt Readings from Last 3 Encounters:  04/28/18 189 lb (85.7 kg)  03/27/18 176 lb 2.4 oz (79.9 kg)  02/19/18 185 lb 6.4 oz (84.1 kg)    Lab Results  Component Value Date   WBC 9.7 03/27/2018   HGB 12.5 (L) 03/27/2018   HCT 36.3 (L) 03/27/2018   PLT 322 03/27/2018   GLUCOSE 72 03/27/2018   ALT 21 03/09/2018   AST 34 03/09/2018   NA 136 03/27/2018   K 5.0 03/27/2018   CL 95 (L) 03/27/2018   CREATININE 9.69 (H) 03/27/2018   BUN 45 (H) 03/27/2018   CO2 24 03/27/2018   TSH 0.825 12/03/2017   INR 1.15 03/09/2018   HGBA1C 5.1 12/08/2013  MICROALBUR 71.80 (H) 09/22/2011    Assessment/Plan:  Essential hypertension -elevated 148/90 -not currently on any meds -discussed eating better. -encouraged to check bp at home. -would benefit from low dose beta blocker.  Chronic combined systolic and diastolic CHF (congestive heart failure) ([place]) -encourage to f/u with cards.  Daughter to make the appointment  ESRD on hemodialysis ([place]) -continue HD T, Th, Sa -avoid nephrotoxic meds -pt advised to stop drinking -f/u with nephrology  Encounter to establish care -We reviewed the PMH, PSH, FH, SH, Meds and Allergies. -We provided refills for any medications  we will prescribe as needed. -We addressed current concerns per orders and patient instructions. -We have asked for records for pertinent exams, studies, vaccines and notes from previous providers. -We have advised patient to follow up per instructions below.  F/u in the next month  Grier Mitts, MD

## 2018-04-28 NOTE — Patient Instructions (Signed)
End-Stage Kidney Disease End-stage kidney disease occurs when the kidneys are so damaged that they cannot do their job. The kidneys are two organs that do many important jobs in the body, which include:  Removing wastes and extra fluids from the blood.  Making hormones that maintain the amount of fluid in your tissues and blood vessels.  Maintaining the right amount of fluids and chemicals in the body.  When the kidneys are damaged and cannot do their job, life-threatening problems occur. Without the help of the kidneys, toxins build up in the blood. In end-stage kidney disease, the kidneys cannot get better. What are the causes? End-stage kidney disease usually occurs when a long-lasting (chronic) kidney disease gets worse. It may also occur after the kidneys are suddenly damaged (acute kidney injury). What increases the risk? This condition is more likely to develop in people who are:  Older than age 52.  Male.  Of African-American descent.  Current smokers or former smokers.  Obese.  You may also have an increased risk for end-stage kidney disease if you:  Have a family history of chronic kidney disease (CKD).  Have had kidney disease for many years.  Have other longstanding medical conditions that affect the kidneys, such as: ? Cardiovascular disease including high blood pressure. ? Diabetes. ? Certain diseases that affect the immune system.  What are the signs or symptoms?  Swelling (edema) of the face, legs, ankles, or feet.  Numbness, tingling, or loss of feeling (sensation) in your hands or feet.  Tiredness (lethargy).  Nausea or vomiting.  Confusion, trouble concentrating, or loss of consciousness.  Chest pain.  Shortness of breath.  Little to no urine production.  Muscle twitches and cramps, especially in the legs.  Constant itchiness.  Loss of appetite.  Pale skin and tissue lining your eyelids (conjunctiva).  Headaches.  Abnormally dark or  light skin.  Decrease in muscle size (muscle wasting).  Easy bruising.  Frequent hiccups.  Stopping of menstruation in women.  Seizures. How is this diagnosed? Your health care provider will measure your blood pressure and do some tests. These may include:  Urine tests.  Blood tests.  Imaging tests.  A test in which a sample of tissue is removed from the kidneys to be looked at under a microscope (kidney biopsy).  How is this treated? There are two treatments for end-stage kidney disease:  A procedure that removes toxic wastes from the body (dialysis). Depending on the type of dialysis you choose, it may be performed more than one time a day (peritoneal dialysis) or several times a week (hemodialysis).  Surgery toreceive a new kidney (kidney transplant).  In addition to having dialysis or a kidney transplant, you may need to take medicines:  To control high blood pressure (hypertension).  To control cholesterol.  To maintain healthy electrolyte levels in your blood.  You may also be given a specific diet to follow that includes requirements or limits for:  Salt (sodium).  Protein.  Phosphorous.  Potassium.  Calcium.  Follow these instructions at home:  Follow your prescribed diet.  Take over-the-counter and prescription medicines only as told by your health care provider. ? Do not take any new medicines unless approved by your health care provider. Many medicines can worsen your kidney damage. ? Do not take any vitamin and mineral supplements unless approved by your health care provider. Many nutritional supplements can worsen your kidney damage. ? The dose of some medicines that you take may need to be  adjusted.  Do not use any tobacco products, such as cigarettes, chewing tobacco, and e-cigarettes. If you need help quitting, ask your health care provider.  Keep all follow-up visits as told by your health care provider. This is important.  Keep track of  your blood pressure. Report changes in your blood pressure as told by your health care provider.  Achieve and maintain a healthy weight. If you need help with this, ask your health care provider.  Start or continue an exercise plan. Try to exercise at least 30 minutes a day, 5 days a week.  Stay current with immunizations as told by your health care provider. Where to find more information:  American Association of Kidney Patients: BombTimer.gl  National Kidney Foundation: www.kidney.[place]: https://mathis.com/  [facility]: www.lifeoptions.org and www.kidneyschool.org Contact a health care provider if:  Your symptoms get worse.  You develop new symptoms. Get help right away if:  You have weakness in an arm or leg on one side of your body.  You have difficulty speaking or you are slurring your speech.  You have a sudden change in your vision.  You have a sudden, severe headache.  You have a sudden weight increase.  You have difficulty breathing.  Your symptoms suddenly get worse. This information is not intended to replace advice given to you by your health care provider. Make sure you discuss any questions you have with your health care provider. Document Released: 01/03/2004 Document Revised: 03/20/2016 Document Reviewed: 06/11/2012 Elsevier Interactive Patient Education  2017 [place] for Chronic Kidney Disease When your kidneys are not working well, they cannot remove waste and excess substances from your blood as effectively as they did before. This can lead to a buildup and imbalance of these substances, which can worsen kidney damage and affect how your body functions. Certain foods lead to a buildup of these substances in the body. By changing your diet as recommended by your diet and nutrition specialist (dietitian) or health care provider, you could help prevent further kidney damage and delay or prevent the  need for dialysis. What are tips for following this plan? General instructions  Work with your health care provider and dietitian to develop a meal plan that is right for you. Foods you can eat, limit, or avoid will be different for each person depending on the stage of kidney disease and any other existing health conditions.  Talk with your health care provider about whether you should take a vitamin and mineral supplement.  Use standard measuring cups and spoons to measure servings of foods. Use a kitchen scale to measure portions of protein foods.  If directed by your health care provider, avoid drinking too much fluid. Measure and count all liquids, including water, ice, soups, flavored gelatin, and frozen desserts such as popsicles or ice cream. Reading food labels  Check the amount of sodium in foods. Choose foods that have less than 300 milligrams (mg) per serving.  Check the ingredient list for phosphorus or potassium-based additives or preservatives.  Check the amount of saturated and trans fat. Limit or avoid these fats as told by your dietitian. Shopping  Avoid buying foods that are: ? Processed, frozen, or prepackaged. ? Calcium-enriched or fortified.  Do not buy foods that have salt or sodium listed among the first five ingredients.  Do not buy canned vegetables. Cooking  Replace animal proteins, such as meat, fish, eggs, or dairy, with plant proteins from beans, nuts,  and soy. ? Use soy milk instead of cow's milk. ? Add beans or tofu to soups, casseroles, or pasta dishes instead of meat.  Soak vegetables, such as potatoes, before cooking to reduce potassium. To do this: ? Peel and cut into small pieces. ? Soak in warm water for at least 2 hours. For every 1 cup of vegetables, use 10 cups of water. ? Drain and rinse with warm water. ? Boil for at least 5 minutes. Meal planning  Limit the amount of protein from plant and animal sources you eat each day.  Do not  add salt to food when cooking or before eating.  Eat meals and snacks at around the same time each day. If you have diabetes:  If you have diabetes (diabetes mellitus) and chronic kidney disease, it is important to keep your blood glucose in the target range recommended by your health care provider. Follow your diabetes management plan. This may include: ? Checking your blood glucose regularly. ? Taking oral medicines, insulin, or both. ? Exercising for at least 30 minutes on 5 or more days each week, or as told by your health care provider. ? Tracking how many servings of carbohydrates you eat at each meal.  You may be given specific guidelines on how much of certain foods and nutrients you may eat, depending on your stage of kidney disease and whether you have high blood pressure (hypertension). Follow your meal plan as told by your dietitian. What nutrients should be limited? The items listed are not a complete list. Talk with your dietitian about what dietary choices are best for you. Potassium Potassium affects how steadily your heart beats. If too much potassium builds up in your blood, it can cause an irregular heartbeat or even a heart attack. You may need to eat less potassium, depending on your blood potassium levels and the stage of kidney disease. Talk to your dietitian about how much potassium you may have each day. You may need to limit or avoid foods that are high in potassium, such as:  Milk and soy milk.  Fruits, such as bananas, papaya, apricots, nectarines, melon, prunes, raisins, kiwi, and oranges.  Vegetables, such as potatoes, sweet potatoes, yams, tomatoes, leafy greens, beets, okra, avocado, pumpkin, and winter squash.  White and lima beans.  Phosphorus Phosphorus is a mineral found in your bones. A balance between calcium and phosphorous is needed to build and maintain healthy bones. Too much phosphorus pulls calcium from your bones. This can make your bones weak  and more likely to break. Too much phosphorus can also make your skin itch. You may need to eat less phosphorus depending on your blood phosphorus levels and the stage of kidney disease. Talk to your dietitian about how much potassium you may have each day. You may need to take medicine to lower your blood phosphorus levels if diet changes do not help. You may need to limit or avoid foods that are high in phosphorus, such as:  Milk and dairy products.  Dried beans and peas.  Tofu, soy milk, and other soy-based meat replacements.  Colas.  Nuts and peanut butter.  Meat, poultry, and fish.  Bran cereals and oatmeals.  Protein Protein helps you to make and keep muscle. It also helps in the repair of your body's cells and tissues. One of the natural breakdown products of protein is a waste product called urea. When your kidneys are not working properly, they cannot remove wastes, such as urea, like they  did before you developed chronic kidney disease. Reducing how much protein you eat can help prevent a buildup of urea in your blood. Depending on your stage of kidney disease, you may need to limit foods that are high in protein. Sources of animal protein include:  Meat (all types).  Fish and seafood.  Poultry.  Eggs.  Dairy.  Other protein foods include:  Beans and legumes.  Nuts and nut butter.  Soy and tofu.  Sodium Sodium, which is found in salt, helps maintain a healthy balance of fluids in your body. Too much sodium can increase your blood pressure and have a negative effect on the function of your heart and lungs. Too much sodium can also cause your body to retain too much fluid, making your kidneys work harder. Most people should have less than 2,300 milligrams (mg) of sodium each day. If you have hypertension, you may need to limit your sodium to 1,500 mg each day. Talk to your dietitian about how much sodium you may have each day. You may need to limit or avoid foods  that are high in sodium, such as:  Salt seasonings.  Soy sauce.  Cured and processed meats.  Salted crackers and snack foods.  Fast food.  Canned soups and most canned foods.  Pickled foods.  Vegetable juice.  Boxed mixes or ready-to-eat boxed meals and side dishes.  Bottled dressings, sauces, and marinades.  Summary  Chronic kidney disease can lead to a buildup and imbalance of waste and excess substances in the body. Certain foods lead to a buildup of these substances. By adjusting your intake of these foods, you could help prevent more kidney damage and delay or prevent the need for dialysis.  Food adjustments are different for each person with chronic kidney disease. Work with a dietitian to set up nutrient goals and a meal plan that is right for you.  If you have diabetes and chronic kidney disease, it is important to keep your blood glucose in the target range recommended by your health care provider. This information is not intended to replace advice given to you by your health care provider. Make sure you discuss any questions you have with your health care provider. Document Released: 01/03/2003 Document Revised: 10/08/2016 Document Reviewed: 10/08/2016 Elsevier Interactive Patient Education  2018 [place].  Heart Failure Eating Plan Heart failure, also called congestive heart failure, occurs when your heart does not pump blood well enough to meet your body's needs for oxygen-rich blood. Heart failure is a long-term (chronic) condition. Living with heart failure can be challenging. However, following your health care provider's instructions about a healthy lifestyle and working with a diet and nutrition specialist (dietitian) to choose the right foods may help to improve your symptoms. What are tips for following this plan? General guidelines  Do not eat more than 2,300 mg of salt (sodium) a day. The amount of sodium that is recommended for you may be lower,  depending on your condition.  Maintain a healthy body weight as directed. Ask your health care provider what a healthy weight is for you. ? Check your weight every day. ? Work with your health care provider and dietitian to make a plan that is right for you to lose weight or maintain your current weight.  Limit how much fluid you drink. Ask your health care provider or dietitian how much fluid you can have each day.  Limit or avoid alcohol as told by your health care provider or dietitian. Reading  food labels  Check food labels for the amount of sodium per serving. Choose foods that have less than 140 mg (milligrams) of sodium in each serving.  Check food labels for the number of calories per serving. This is important if you need to limit your daily calorie intake to lose weight.  Check food labels for the serving size. If you eat more than one serving, you will be eating more sodium and calories than what is listed on the label.  Look for foods that are labeled as "sodium-free," "very low sodium," or "low sodium." ? Foods labeled as "reduced sodium" or "lightly salted" may still have more sodium than what is recommended for you. Cooking  Avoid adding salt when cooking. Ask your health care provider or dietitian before using salt substitutes.  Season food with salt-free seasonings, spices, or herbs. Check the label of seasoning mixes to make sure they do not contain salt.  Cook with heart-healthy oils, such as olive, canola, soybean, or sunflower oil.  Do not fry foods. Cook foods using low-fat methods, such as baking, boiling, grilling, and broiling.  Limit unhealthy fats when cooking by: ? Removing the skin from poultry, such as chicken. ? Removing all visible fats from meats. ? Skimming the fat off from stews, soups, and gravies before serving them. Meal planning  Limit your intake of: ? Processed, canned, or pre-packaged foods. ? Foods that are high in trans fat, such as  fried foods. ? Sweets, desserts, sugary drinks, and other foods with added sugar. ? Full-fat dairy products, such as whole milk.  Eat a balanced diet that includes: ? 4-5 servings of fruit each day and 4-5 servings of vegetables each day. At each meal, try to fill half of your plate with fruits and vegetables. ? Up to 6-8 servings of whole grains each day. ? Up to 2 servings of lean meat, poultry, or fish each day. One serving of meat is equal to 3 oz. This is about the same size as a deck of cards. ? 2 servings of low-fat dairy each day. ? Heart-healthy fats. Healthy fats called omega-3 fatty acids are found in foods such as flaxseed and cold-water fish like sardines, salmon, and mackerel.  Aim to eat 25-35 g (grams) of fiber a day. Foods that are high in fiber include apples, broccoli, carrots, beans, peas, and whole grains.  Do not add salt or condiments that contain salt (such as soy sauce) to foods before eating.  When eating at a restaurant, ask that your food be prepared with less salt or no salt, if possible.  Try to eat 2 or more vegetarian meals each week.  Eat more home-cooked food and eat less restaurant, buffet, and fast food. Recommended foods The items listed may not be a complete list. Talk with your dietitian about what dietary choices are best for you. Grains Bread with less than 80 mg of sodium per slice. Whole-wheat pasta, quinoa, and brown rice. Oats and oatmeal. Barley. [place]. Grits and cream of wheat. Whole-grain and whole-wheat cold cereal. Vegetables All fresh vegetables. Vegetables that are frozen without sauce or added salt. Low-sodium or sodium-free canned vegetables. Fruits All fresh, frozen, and canned fruits. Dried fruits, such as raisins, prunes, and cranberries. Meats and other protein foods Lean cuts of meat. Skinless chicken and [place]. Fish with high omega-3 fatty acids, such as salmon, sardines, and other cold-water fishes. Eggs. Dried beans, peas,  and edamame. Unsalted nuts and nut butters. Dairy Low-fat or nonfat (skim)  milk and dried milk. Rice milk, soy milk, and almond milk. Low-fat or nonfat yogurt. Small amounts of reduced-sodium block cheese. Low-sodium cottage cheese. Fats and oils Olive, canola, soybean, flaxseed, or sunflower oil. Avocado. Sweets and desserts Apple sauce. Granola bars. Sugar-free pudding and gelatin. Frozen fruit bars. Seasoning and other foods Fresh and dried herbs. Lemon or lime juice. Vinegar. Low-sodium ketchup. Salt-free marinades, salad dressings, sauces, and seasonings. Foods to avoid The items listed may not be a complete list. Talk with your dietitian about what dietary choices are best for you. Grains Bread with more than 80 mg of sodium per slice. Hot or cold cereal with more than 140 mg sodium per serving. Salted pretzels and crackers. Pre-packaged breadcrumbs. Bagels, croissants, and biscuits. Vegetables Canned vegetables. Frozen vegetables with sauce or seasonings. Creamed vegetables. [place] fries. Onion rings. Pickled vegetables and sauerkraut. Fruits Fruits that are dried with sodium-containing preservatives. Meats and other protein foods Ribs and chicken wings. Bacon, ham, pepperoni, bologna, salami, and packaged luncheon meats. Hot dogs, bratwurst, and sausage. Canned meat. Smoked meat and fish. Salted nuts and seeds. Dairy Whole milk, half-and-half, and cream. Buttermilk. Processed cheese, cheese spreads, and cheese curds. Regular cottage cheese. Feta cheese. Shredded cheese. String cheese. Fats and oils Butter, lard, shortening, ghee, and bacon fat. Canned and packaged gravies. Seasoning and other foods Onion salt, garlic salt, table salt, and sea salt. Marinades. Regular salad dressings. Relishes, pickles, and olives. Meat flavorings and tenderizers, and bouillon cubes. Horseradish, ketchup, and mustard. Worcestershire sauce. Teriyaki sauce, soy sauce (including reduced sodium). Hot sauce  and Tabasco sauce. Steak sauce, fish sauce, oyster sauce, and cocktail sauce. Taco seasonings. Barbecue sauce. Tartar sauce. Summary  A heart failure eating plan includes changes that limit your intake of sodium and unhealthy fat, and it may help you lose weight or maintain a healthy weight. Your health care provider may also recommend limiting how much fluid you drink.  Most people with heart failure should eat no more than 2,300 mg of salt (sodium) a day. The amount of sodium that is recommended for you may be lower, depending on your condition.  Contact your health care provider or dietitian before making any major changes to your diet. This information is not intended to replace advice given to you by your health care provider. Make sure you discuss any questions you have with your health care provider. Document Released: 02/27/2017 Document Revised: 02/27/2017 Document Reviewed: 02/27/2017 Elsevier Interactive Patient Education  2018 [employer].

## 2018-04-29 DIAGNOSIS — D631 Anemia in chronic kidney disease: Secondary | ICD-10-CM | POA: Diagnosis not present

## 2018-04-29 DIAGNOSIS — N186 End stage renal disease: Secondary | ICD-10-CM | POA: Diagnosis not present

## 2018-04-29 DIAGNOSIS — D509 Iron deficiency anemia, unspecified: Secondary | ICD-10-CM | POA: Diagnosis not present

## 2018-04-29 DIAGNOSIS — N2581 Secondary hyperparathyroidism of renal origin: Secondary | ICD-10-CM | POA: Diagnosis not present

## 2018-05-01 DIAGNOSIS — N186 End stage renal disease: Secondary | ICD-10-CM | POA: Diagnosis not present

## 2018-05-01 DIAGNOSIS — D509 Iron deficiency anemia, unspecified: Secondary | ICD-10-CM | POA: Diagnosis not present

## 2018-05-01 DIAGNOSIS — D631 Anemia in chronic kidney disease: Secondary | ICD-10-CM | POA: Diagnosis not present

## 2018-05-01 DIAGNOSIS — N2581 Secondary hyperparathyroidism of renal origin: Secondary | ICD-10-CM | POA: Diagnosis not present

## 2018-05-04 DIAGNOSIS — N2581 Secondary hyperparathyroidism of renal origin: Secondary | ICD-10-CM | POA: Diagnosis not present

## 2018-05-04 DIAGNOSIS — D631 Anemia in chronic kidney disease: Secondary | ICD-10-CM | POA: Diagnosis not present

## 2018-05-04 DIAGNOSIS — N186 End stage renal disease: Secondary | ICD-10-CM | POA: Diagnosis not present

## 2018-05-04 DIAGNOSIS — D509 Iron deficiency anemia, unspecified: Secondary | ICD-10-CM | POA: Diagnosis not present

## 2018-05-06 DIAGNOSIS — D509 Iron deficiency anemia, unspecified: Secondary | ICD-10-CM | POA: Diagnosis not present

## 2018-05-06 DIAGNOSIS — N2581 Secondary hyperparathyroidism of renal origin: Secondary | ICD-10-CM | POA: Diagnosis not present

## 2018-05-06 DIAGNOSIS — D631 Anemia in chronic kidney disease: Secondary | ICD-10-CM | POA: Diagnosis not present

## 2018-05-06 DIAGNOSIS — N186 End stage renal disease: Secondary | ICD-10-CM | POA: Diagnosis not present

## 2018-05-08 DIAGNOSIS — D509 Iron deficiency anemia, unspecified: Secondary | ICD-10-CM | POA: Diagnosis not present

## 2018-05-08 DIAGNOSIS — D631 Anemia in chronic kidney disease: Secondary | ICD-10-CM | POA: Diagnosis not present

## 2018-05-08 DIAGNOSIS — N2581 Secondary hyperparathyroidism of renal origin: Secondary | ICD-10-CM | POA: Diagnosis not present

## 2018-05-08 DIAGNOSIS — N186 End stage renal disease: Secondary | ICD-10-CM | POA: Diagnosis not present

## 2018-05-11 ENCOUNTER — Observation Stay (HOSPITAL_COMMUNITY)
Admission: EM | Admit: 2018-05-11 | Discharge: 2018-05-12 | Disposition: A | Discharge status: Home / Self Care | Payer: Medicare Other | Attending: Internal Medicine | Admitting: Internal Medicine

## 2018-05-11 ENCOUNTER — Emergency Department (HOSPITAL_COMMUNITY): Payer: Medicare Other

## 2018-05-11 DIAGNOSIS — Z9111 Patient's noncompliance with dietary regimen: Secondary | ICD-10-CM | POA: Insufficient documentation

## 2018-05-11 DIAGNOSIS — I160 Hypertensive urgency: Secondary | ICD-10-CM

## 2018-05-11 DIAGNOSIS — F1010 Alcohol abuse, uncomplicated: Secondary | ICD-10-CM | POA: Diagnosis not present

## 2018-05-11 DIAGNOSIS — J9621 Acute and chronic respiratory failure with hypoxia: Secondary | ICD-10-CM | POA: Insufficient documentation

## 2018-05-11 DIAGNOSIS — I4892 Unspecified atrial flutter: Secondary | ICD-10-CM | POA: Diagnosis not present

## 2018-05-11 DIAGNOSIS — J810 Acute pulmonary edema: Secondary | ICD-10-CM

## 2018-05-11 DIAGNOSIS — Z79899 Other long term (current) drug therapy: Secondary | ICD-10-CM | POA: Diagnosis not present

## 2018-05-11 DIAGNOSIS — N186 End stage renal disease: Secondary | ICD-10-CM | POA: Diagnosis not present

## 2018-05-11 DIAGNOSIS — I161 Hypertensive emergency: Secondary | ICD-10-CM | POA: Diagnosis present

## 2018-05-11 DIAGNOSIS — Z23 Encounter for immunization: Secondary | ICD-10-CM | POA: Insufficient documentation

## 2018-05-11 DIAGNOSIS — Z992 Dependence on renal dialysis: Secondary | ICD-10-CM | POA: Diagnosis not present

## 2018-05-11 DIAGNOSIS — R001 Bradycardia, unspecified: Secondary | ICD-10-CM | POA: Diagnosis not present

## 2018-05-11 DIAGNOSIS — R0602 Shortness of breath: Secondary | ICD-10-CM | POA: Diagnosis not present

## 2018-05-11 DIAGNOSIS — I5043 Acute on chronic combined systolic (congestive) and diastolic (congestive) heart failure: Secondary | ICD-10-CM | POA: Diagnosis not present

## 2018-05-11 DIAGNOSIS — I4891 Unspecified atrial fibrillation: Secondary | ICD-10-CM | POA: Insufficient documentation

## 2018-05-11 DIAGNOSIS — I429 Cardiomyopathy, unspecified: Secondary | ICD-10-CM | POA: Insufficient documentation

## 2018-05-11 DIAGNOSIS — I132 Hypertensive heart and chronic kidney disease with heart failure and with stage 5 chronic kidney disease, or end stage renal disease: Principal | ICD-10-CM | POA: Insufficient documentation

## 2018-05-11 DIAGNOSIS — D631 Anemia in chronic kidney disease: Secondary | ICD-10-CM | POA: Insufficient documentation

## 2018-05-11 DIAGNOSIS — J9620 Acute and chronic respiratory failure, unspecified whether with hypoxia or hypercapnia: Secondary | ICD-10-CM | POA: Diagnosis present

## 2018-05-11 DIAGNOSIS — I5033 Acute on chronic diastolic (congestive) heart failure: Secondary | ICD-10-CM | POA: Diagnosis present

## 2018-05-11 DIAGNOSIS — N2581 Secondary hyperparathyroidism of renal origin: Secondary | ICD-10-CM | POA: Diagnosis not present

## 2018-05-11 DIAGNOSIS — R0689 Other abnormalities of breathing: Secondary | ICD-10-CM | POA: Diagnosis not present

## 2018-05-11 DIAGNOSIS — J811 Chronic pulmonary edema: Secondary | ICD-10-CM | POA: Diagnosis present

## 2018-05-11 DIAGNOSIS — R0902 Hypoxemia: Secondary | ICD-10-CM | POA: Diagnosis not present

## 2018-05-11 DIAGNOSIS — J9601 Acute respiratory failure with hypoxia: Secondary | ICD-10-CM | POA: Insufficient documentation

## 2018-05-11 DIAGNOSIS — Z8674 Personal history of sudden cardiac arrest: Secondary | ICD-10-CM | POA: Insufficient documentation

## 2018-05-11 DIAGNOSIS — I120 Hypertensive chronic kidney disease with stage 5 chronic kidney disease or end stage renal disease: Secondary | ICD-10-CM | POA: Diagnosis not present

## 2018-05-11 DIAGNOSIS — J9600 Acute respiratory failure, unspecified whether with hypoxia or hypercapnia: Secondary | ICD-10-CM | POA: Insufficient documentation

## 2018-05-11 DIAGNOSIS — F17290 Nicotine dependence, other tobacco product, uncomplicated: Secondary | ICD-10-CM | POA: Insufficient documentation

## 2018-05-11 LAB — I-STAT ARTERIAL BLOOD GAS, ED
Acid-base deficit: 7 mmol/L — ABNORMAL HIGH (ref 0.0–2.0)
Bicarbonate: 19.6 mmol/L — ABNORMAL LOW (ref 20.0–28.0)
O2 Saturation: 98 %
TCO2: 21 mmol/L — ABNORMAL LOW (ref 22–32)
pCO2 arterial: 42.4 mmHg (ref 32.0–48.0)
pH, Arterial: 7.273 — ABNORMAL LOW (ref 7.350–7.450)
pO2, Arterial: 118 mmHg — ABNORMAL HIGH (ref 83.0–108.0)

## 2018-05-11 LAB — COMPREHENSIVE METABOLIC PANEL
ALBUMIN: 3.2 g/dL — AB (ref 3.5–5.0)
ALK PHOS: 53 U/L (ref 38–126)
ALT: 10 U/L (ref 0–44)
ANION GAP: 19 — AB (ref 5–15)
AST: 18 U/L (ref 15–41)
BUN: 66 mg/dL — ABNORMAL HIGH (ref 8–23)
CALCIUM: 8.5 mg/dL — AB (ref 8.9–10.3)
CO2: 16 mmol/L — AB (ref 22–32)
CREATININE: 11.1 mg/dL — AB (ref 0.61–1.24)
Chloride: 105 mmol/L (ref 98–111)
GFR calc Af Amer: 5 mL/min — ABNORMAL LOW (ref >60)
GFR calc non Af Amer: 4 mL/min — ABNORMAL LOW (ref >60)
GLUCOSE: 148 mg/dL — AB (ref 70–99)
Potassium: 4.8 mmol/L (ref 3.5–5.1)
SODIUM: 140 mmol/L (ref 135–145)
Total Bilirubin: 0.7 mg/dL (ref 0.3–1.2)
Total Protein: 7.6 g/dL (ref 6.5–8.1)

## 2018-05-11 LAB — CBC WITH DIFFERENTIAL/PLATELET
ABS IMMATURE GRANULOCYTES: 0 10*3/uL (ref 0.0–0.1)
BASOS ABS: 0.1 10*3/uL (ref 0.0–0.1)
Basophils Relative: 1 %
Eosinophils Absolute: 1 10*3/uL — ABNORMAL HIGH (ref 0.0–0.7)
Eosinophils Relative: 12 %
HCT: 35.2 % — ABNORMAL LOW (ref 39.0–52.0)
HEMOGLOBIN: 11.2 g/dL — AB (ref 13.0–17.0)
Immature Granulocytes: 1 %
LYMPHS PCT: 32 %
Lymphs Abs: 2.7 10*3/uL (ref 0.7–4.0)
MCH: 34.1 pg — ABNORMAL HIGH (ref 26.0–34.0)
MCHC: 31.8 g/dL (ref 30.0–36.0)
MCV: 107.3 fL — ABNORMAL HIGH (ref 78.0–100.0)
Monocytes Absolute: 0.8 10*3/uL (ref 0.1–1.0)
Monocytes Relative: 9 %
NEUTROS ABS: 3.8 10*3/uL (ref 1.7–7.7)
NEUTROS PCT: 45 %
Platelets: 271 10*3/uL (ref 150–400)
RBC: 3.28 MIL/uL — ABNORMAL LOW (ref 4.22–5.81)
RDW: 16.8 % — ABNORMAL HIGH (ref 11.5–15.5)
WBC: 8.4 10*3/uL (ref 4.0–10.5)

## 2018-05-11 LAB — MRSA PCR SCREENING: MRSA by PCR: NEGATIVE

## 2018-05-11 LAB — I-STAT TROPONIN, ED: Troponin i, poc: 0.04 ng/mL (ref 0.00–0.08)

## 2018-05-11 LAB — LACTIC ACID, PLASMA: LACTIC ACID, VENOUS: 0.9 mmol/L (ref 0.5–1.9)

## 2018-05-11 LAB — VALPROIC ACID LEVEL

## 2018-05-11 LAB — BRAIN NATRIURETIC PEPTIDE: B NATRIURETIC PEPTIDE 5: 2701.6 pg/mL — AB (ref 0.0–100.0)

## 2018-05-11 LAB — I-STAT CG4 LACTIC ACID, ED: Lactic Acid, Venous: 5.04 mmol/L (ref 0.5–1.9)

## 2018-05-11 MED ORDER — ACETAMINOPHEN 325 MG PO TABS
650.0000 mg | ORAL_TABLET | Freq: Four times a day (QID) | ORAL | Status: DC | PRN
  Administered 2018-05-11: 650 mg via ORAL
  Filled 2018-05-11: qty 2

## 2018-05-11 MED ORDER — ONDANSETRON HCL 4 MG/2ML IJ SOLN: 4.0000 mg | Freq: Four times a day (QID) | INTRAMUSCULAR | Status: DC | PRN

## 2018-05-11 MED ORDER — HEPARIN SODIUM (PORCINE) 5000 UNIT/ML IJ SOLN
5000.0000 [IU] | Freq: Three times a day (TID) | INTRAMUSCULAR | Status: DC
  Administered 2018-05-11 – 2018-05-12 (×2): 5000 [IU] via SUBCUTANEOUS
  Filled 2018-05-11 (×2): qty 1

## 2018-05-11 MED ORDER — FOLIC ACID 1 MG PO TABS
1.0000 mg | ORAL_TABLET | Freq: Every day | ORAL | Status: DC
  Administered 2018-05-11 – 2018-05-12 (×2): 1 mg via ORAL
  Filled 2018-05-11 (×2): qty 1

## 2018-05-11 MED ORDER — SODIUM CHLORIDE 0.9 % IV SOLN: 250.0000 mL | INTRAVENOUS | Status: DC | PRN

## 2018-05-11 MED ORDER — METOPROLOL TARTRATE 12.5 MG HALF TABLET
12.5000 mg | ORAL_TABLET | Freq: Two times a day (BID) | ORAL | Status: DC
  Administered 2018-05-11 – 2018-05-12 (×3): 12.5 mg via ORAL
  Filled 2018-05-11 (×3): qty 1

## 2018-05-11 MED ORDER — AMLODIPINE BESYLATE 10 MG PO TABS
10.0000 mg | ORAL_TABLET | Freq: Every day | ORAL | Status: DC
  Administered 2018-05-12: 10 mg via ORAL
  Filled 2018-05-11: qty 1

## 2018-05-11 MED ORDER — DIPHENHYDRAMINE-APAP (SLEEP) 25-500 MG PO TABS: 1.0000 | ORAL_TABLET | Freq: Every evening | ORAL | Status: DC | PRN

## 2018-05-11 MED ORDER — ACETAMINOPHEN 650 MG RE SUPP
650.0000 mg | Freq: Four times a day (QID) | RECTAL | Status: DC | PRN
Start: 2018-05-11 — End: 2018-05-13

## 2018-05-11 MED ORDER — NITROGLYCERIN IN D5W 200-5 MCG/ML-% IV SOLN
0.0000 ug/min | Freq: Once | INTRAVENOUS | Status: AC
  Administered 2018-05-11: 5 ug/min via INTRAVENOUS
  Filled 2018-05-11: qty 250

## 2018-05-11 MED ORDER — SODIUM CHLORIDE 0.9% FLUSH
3.0000 mL | Freq: Two times a day (BID) | INTRAVENOUS | Status: DC
  Administered 2018-05-11: 3 mL via INTRAVENOUS

## 2018-05-11 MED ORDER — ADULT MULTIVITAMIN W/MINERALS CH
1.0000 | ORAL_TABLET | Freq: Every day | ORAL | Status: DC
  Administered 2018-05-11 – 2018-05-12 (×2): 1 via ORAL
  Filled 2018-05-11 (×2): qty 1

## 2018-05-11 MED ORDER — VITAMIN B-1 100 MG PO TABS
100.0000 mg | ORAL_TABLET | Freq: Every day | ORAL | Status: DC
  Administered 2018-05-11 – 2018-05-12 (×2): 100 mg via ORAL
  Filled 2018-05-11 (×2): qty 1

## 2018-05-11 MED ORDER — PNEUMOCOCCAL VAC POLYVALENT 25 MCG/0.5ML IJ INJ
0.5000 mL | INJECTION | INTRAMUSCULAR | Status: AC
  Administered 2018-05-12: 0.5 mL via INTRAMUSCULAR
  Filled 2018-05-11: qty 0.5

## 2018-05-11 MED ORDER — ONDANSETRON HCL 4 MG PO TABS: 4.0000 mg | ORAL_TABLET | Freq: Four times a day (QID) | ORAL | Status: DC | PRN

## 2018-05-11 MED ORDER — DIVALPROEX SODIUM 500 MG PO DR TAB
500.0000 mg | DELAYED_RELEASE_TABLET | Freq: Two times a day (BID) | ORAL | Status: DC
  Administered 2018-05-11 – 2018-05-12 (×3): 500 mg via ORAL
  Filled 2018-05-11 (×4): qty 1

## 2018-05-11 MED ORDER — AMLODIPINE BESYLATE 5 MG PO TABS: 5.0000 mg | ORAL_TABLET | Freq: Every day | ORAL | Status: DC

## 2018-05-11 MED ORDER — TRAZODONE HCL 50 MG PO TABS: 25.0000 mg | ORAL_TABLET | Freq: Every evening | ORAL | Status: DC | PRN

## 2018-05-11 MED ORDER — CHLORHEXIDINE GLUCONATE CLOTH 2 % EX PADS: 6.0000 | MEDICATED_PAD | Freq: Every day | CUTANEOUS | Status: DC

## 2018-05-11 NOTE — Consult Note (Signed)
[facility] Renal Consultation Note    Indication for Consultation:  Management of ESRD/hemodialysis; anemia, hypertension/volume and secondary hyperparathyroidism  XYI:[id number], Langley Adie, MD  HPI: Kenneth [name] is a 62 y.o. male. ESRD 2/2 HTN on HD TTS at [facility]. Past medical history significant for HTN, combined systolic and diastolic CHF w/EF 37-48%, Hx ETOH abuse, Hx Afib/flutter, and Hx PEA arrest in 02/2018.   Seen and examined at bedside.  Patient states he presented to the ED due to SOB/dyspnea that began at home about 1hr prior to his arrival.  Denies CP, palpitations, n/v/d, edema, headache, weakness, dizziness and confusion.  States he was returned home from the SNF about 3 weeks ago and has been consuming large amounts of salt and ice.  Pertinent findings in the ED include severe hypertension, elevated lactic acid of 5.04, BNP 2702, and CXR showing cardiomegaly and pulmonary edema.  He has been admitted for further evaluation and management of AoC respiratory failure 2/2 pulmonary edema and AoC CHF. .   Of note patient has been compliant with his dialysis regimen attending all treatments for full amount of time since last hospital discharge.  Unfortunately he has not been compliant with fluid restrictions with intra-dialytic weight gains ranging from 4-10kg.   Last time he met his EDW was 6/25 although he has come within a few liters of it in the last couple weeks.   Past Medical History:  Diagnosis Date  . Acute CHF ([place]) 01/2018  . Cardiomyopathy secondary    likely related to HTN heart disease; possibly ETOH related as well  . Chronic combined systolic and diastolic heart failure (HCC)    Echocardiogram 09/22/11: Moderate LVH, EF 27-07%, grade 3 diastolic dysfunction, mild MR, moderate to severe LAE, mild RVE, mild to moderate TR, small to moderate pericardial effusion  . ESRD (end stage renal disease) on dialysis [facility])    due to hypertensive nephrosclerosis   . History of alcohol abuse   . Hypertension    Past Surgical History:  Procedure Laterality Date  . AV FISTULA PLACEMENT Left 05/14/2016   Procedure: LEFT ARM BASILIC VEIN TRANSPOSITION;  Surgeon: Rosetta Posner, MD;  Location: [place];  Service: Vascular;  Laterality: Left;  . PERIPHERAL VASCULAR CATHETERIZATION N/A 05/13/2016   Procedure: Dialysis/Perma Catheter Insertion;  Surgeon: Serafina Mitchell, MD;  Location: [place] CV LAB;  Service: Cardiovascular;  Laterality: N/A;   Family History  Problem Relation Age of Onset  . Emphysema Mother   . Cirrhosis Father    Social History:  reports that he has been smoking cigars.  He has quit using smokeless tobacco. He reports that he drinks about 18.0 oz of alcohol per week. He reports that he does not use drugs. No Known Allergies Prior to Admission medications   Medication Sig Start Date End Date Taking? Authorizing Provider  diphenhydramine-acetaminophen (TYLENOL PM) 25-500 MG TABS tablet Take 1 tablet by mouth at bedtime as needed (for sleep).    [provider]  divalproex (DEPAKOTE) 500 MG DR tablet Take 1 tablet (500 mg total) by mouth 2 (two) times daily. 03/27/18 04/26/18  Dessa Phi, DO  folic acid (FOLVITE) 1 MG tablet Take 1 tablet (1 mg total) by mouth daily. 01/28/18   Cristal Ford, DO  Multiple Vitamin (MULTIVITAMIN WITH MINERALS) TABS tablet Take 1 tablet by mouth daily. 01/28/18   Cristal Ford, DO  thiamine 100 MG tablet Take 1 tablet (100 mg total) by mouth daily. 01/28/18  Cristal Ford, DO   Current Facility-Administered Medications  Medication Dose Route Frequency Provider Last Rate Last Dose  . 0.9 %  sodium chloride infusion  250 mL Intravenous PRN Radene Gunning, NP      . acetaminophen (TYLENOL) tablet 650 mg  650 mg Oral Q6H PRN Radene Gunning, NP       Or  . acetaminophen (TYLENOL) suppository 650 mg  650 mg Rectal Q6H PRN Black, Lezlie Octave, NP      . amLODipine (NORVASC) tablet 5 mg  5 mg Oral Daily  Black, Lezlie Octave, NP      . Derrill Memo ON 05/12/2018] Chlorhexidine Gluconate Cloth 2 % PADS 6 each  6 each Topical Q0600 Black, Lezlie Octave, NP      . diphenhydramine-acetaminophen (TYLENOL PM) 25-500 MG per tablet 1 tablet  1 tablet Oral QHS PRN Black, Lezlie Octave, NP      . divalproex (DEPAKOTE) DR tablet 500 mg  500 mg Oral BID Black, Karen M, NP      . folic acid (FOLVITE) tablet 1 mg  1 mg Oral Daily Black, Lezlie Octave, NP      . heparin injection 5,000 Units  5,000 Units Subcutaneous Q8H Black, Lezlie Octave, NP      . multivitamin with minerals tablet 1 tablet  1 tablet Oral Daily Black, Lezlie Octave, NP      . ondansetron [facility]) tablet 4 mg  4 mg Oral Q6H PRN Radene Gunning, NP       Or  . ondansetron [facility]) injection 4 mg  4 mg Intravenous Q6H PRN Black, Karen M, NP      . sodium chloride flush (NS) 0.9 % injection 3 mL  3 mL Intravenous Q12H Black, Karen M, NP      . thiamine tablet 100 mg  100 mg Oral Daily Black, Karen M, NP      . traZODone (DESYREL) tablet 25 mg  25 mg Oral QHS PRN Radene Gunning, NP       Current Outpatient Medications  Medication Sig Dispense Refill  . diphenhydramine-acetaminophen (TYLENOL PM) 25-500 MG TABS tablet Take 1 tablet by mouth at bedtime as needed (for sleep).    . divalproex (DEPAKOTE) 500 MG DR tablet Take 1 tablet (500 mg total) by mouth 2 (two) times daily. 60 tablet 0  . folic acid (FOLVITE) 1 MG tablet Take 1 tablet (1 mg total) by mouth daily. 30 tablet 0  . Multiple Vitamin (MULTIVITAMIN WITH MINERALS) TABS tablet Take 1 tablet by mouth daily. 30 tablet 0  . thiamine 100 MG tablet Take 1 tablet (100 mg total) by mouth daily. 30 tablet 0   Labs: Basic Metabolic Panel: Recent Labs  Lab 05/11/18 1014  NA 140  K 4.8  CL 105  CO2 16*  GLUCOSE 148*  BUN 66*  CREATININE 11.10*  CALCIUM 8.5*   Liver Function Tests: Recent Labs  Lab 05/11/18 1014  AST 18  ALT 10  ALKPHOS 53  BILITOT 0.7  PROT 7.6  ALBUMIN 3.2*   CBC: Recent Labs  Lab 05/11/18 1014   WBC 8.4  NEUTROABS 3.8  HGB 11.2*  HCT 35.2*  MCV 107.3*  PLT 271   Studies/Results: Dg Chest Port 1 View  Result Date: 05/11/2018 CLINICAL DATA:  Shortness of breath.  History of CHF. EXAM: PORTABLE CHEST 1 VIEW COMPARISON:  03/21/2018. FINDINGS: Cardiomegaly with diffuse bilateral pulmonary interstitial prominence consistent with CHF. No pleural effusion or pneumothorax. IMPRESSION: Cardiomegaly with  diffuse bilateral from interstitial prominence consistent with CHF. Electronically Signed   By: Marcello Moores  Register   On: 05/11/2018 10:20    ROS: All others negative except those listed in HPI.  Physical Exam: Vitals:   05/11/18 1200 05/11/18 1215 05/11/18 1230 05/11/18 1245  BP: (!) 188/113 (!) 191/124 (!) 186/119 (!) 192/115  Pulse:   83 85  Resp: 16 16 15 18   SpO2:   99% 99%     General: WDWN, NAD, chronically ill appearing male Head: NCAT, sclera mildly injected but not icteric, MMM Neck: Supple. No lymphadenopathy. Lungs: +bibasilar crackles. No wheeze or rhonchi. Breathing is unlabored. Heart: RRR. No murmur, rubs or gallops.  Abdomen: soft, nontender, +BS, no guarding, no rebound tenderness M/S:  Equal strength b/l in upper and lower extremities.  Lower extremities:no edema, ischemic changes, or open wounds  Neuro: AAOx3. Moves all extremities spontaneously. Psych:  Responds to questions appropriately with a normal affect. Dialysis Access: LU AVF cannulated  Dialysis Orders:  TTS - [facility] [address]  4.25hrs, BFR 400, DFR 800,  EDW 79.5kg, 2K/ 2Ca  Access: LU AVF  Heparin 2400 Unit bolus Mircera 75 mcg q2wks - last 7/11 No VDRA Sensipar 73m qHD TIW  Assessment/Plan: 1.  Acute pulmonary edema 2/2 volume overload d/t noncompliance with fluid restrictions- CXR indicates diffuse bilateral interstitial edema. >10kg over EDW, plan for HD today and tomorrow for excess volume removal. Discussed compliance with sodium and fluid restrictions.  2. Acute on chronic combined  diastolic & systolic CHF - CXR indicates CHF, BNP 2701. 3.  ESRD -  TTS HD.  Compliant with HD but chronically large IDWG.  Plan for HD today with net UF goal 6L then again tomorrow likely with similar fluid goals. K 4.8. 4.  Hypertension - HTN emergency likely due to volume overload.  Expect improvement with HD.  Not on antihypertensives at home per OP records. Titrate down volume with HD today and tomorrow. 5.  Anemia of CKD - Hgb 11.2. No indications for ESA at this time.  6.  Secondary Hyperparathyroidism -  Ca in goal. Phos mildly elevated as OP.  Continue binders and sensipar. Not on VDRA. 7.  Nutrition - Alb 3.2. Renal diet with fluid restrictions. Nepro. Renavite. 8. HX PEA arrest- during last admit 9. Hx ETOH abuse 10. Hx A. Flutter - not on anticoagulation   LJen Mow PA-C [facility] Pager: [phone]/16/2019, 2:14 PM

## 2018-05-11 NOTE — Progress Notes (Signed)
Pt arrived to unit via stretcher from dialysis  Via transport. Pt was placed in bed and oriented to room. Pt was also oriented to call bell system. All personal items are within reach and pt with no requests at this time.

## 2018-05-11 NOTE — Progress Notes (Signed)
Pt taken off bipap and placed on 3L [place]. Pt denies SOB, no increased WOB. VS within normal limits.

## 2018-05-11 NOTE — H&P (Signed)
History and Physical    Kenneth [name] NLZ:[id number] DOB: 1955/11/24 DOA: 05/11/2018  PCP: [name], MD Patient coming from: home  Chief Complaint: sob  HPI: Kenneth [name] is a 62 y.o. male with medical history significant for end-stage renal disease a Tuesday Thursday Saturday dialysis patient, combined systolic diastolic heart failure with frequent flash pulmonary edema, hypertension presents to the emergency department with the chief complaint of shortness of breath. Initial evaluation reveals acute respiratory failure secondary to acute on chronic heart failure in the setting of uncontrolled blood pressure and dietary noncompliance.  Information is obtained from the chart and the patient. Patient states he was in his usual state of health until about 2 hours before he presented he developed sudden shortness of breath. EMS called he was in obvious respiratory distress and placed on C Pap. He also received one sublingual nitroglycerin. He denies chest pain palpitations headache dizziness syncope or near-syncope. He denies any abdominal pain nausea vomiting. He denies dysuria hematuria frequency or urgency. He reports he is due for dialysis today. He also states he has a history of being hospitalized for pulmonary edema. He reports he eats salt right out of his hand. He goes to dialysis on [address]    ED Course: in the emergency department he's afebrile hemodynamically stable on Bipap. Now he is weaned off of BiPAP and placed on 3 L nasal cannula. At the time of admission there is no increased work of breathing oxygen saturation level is 99% on 3 L nasal cannula. He's also provided with a nitroglycerin drip  Review of Systems: As per HPI otherwise all other systems reviewed and are negative.   Ambulatory Status:ambulates independently is independent with ADLs  Past Medical History:  Diagnosis Date  . Acute CHF ([place]) 01/2018  . Cardiomyopathy secondary    likely related to  HTN heart disease; possibly ETOH related as well  . Chronic combined systolic and diastolic heart failure (HCC)    Echocardiogram 09/22/11: Moderate LVH, EF 90-24%, grade 3 diastolic dysfunction, mild MR, moderate to severe LAE, mild RVE, mild to moderate TR, small to moderate pericardial effusion  . ESRD (end stage renal disease) on dialysis [facility])    due to hypertensive nephrosclerosis  . History of alcohol abuse   . Hypertension     Past Surgical History:  Procedure Laterality Date  . AV FISTULA PLACEMENT Left 05/14/2016   Procedure: LEFT ARM BASILIC VEIN TRANSPOSITION;  Surgeon: Rosetta Posner, MD;  Location: [place];  Service: Vascular;  Laterality: Left;  . PERIPHERAL VASCULAR CATHETERIZATION N/A 05/13/2016   Procedure: Dialysis/Perma Catheter Insertion;  Surgeon: Serafina Mitchell, MD;  Location: [place] CV LAB;  Service: Cardiovascular;  Laterality: N/A;    Social History   Socioeconomic History  . Marital status: Single    Spouse name: Not on file  . Number of children: Not on file  . Years of education: Not on file  . Highest education level: Not on file  Occupational History  . Not on file  Social Needs  . Financial resource strain: Not on file  . Food insecurity:    Worry: Not on file    Inability: Not on file  . Transportation needs:    Medical: Not on file    Non-medical: Not on file  Tobacco Use  . Smoking status: Current Every Day Smoker    Types: Cigars  . Smokeless tobacco: Former Network engineer and Sexual Activity  . Alcohol use: Yes  Alcohol/week: 18.0 oz    Types: 30 Cans of beer per week    Frequency: Never    Comment: drinks daily  . Drug use: No  . Sexual activity: Not on file  Lifestyle  . Physical activity:    Days per week: Not on file    Minutes per session: Not on file  . Stress: Not on file  Relationships  . Social connections:    Talks on phone: Not on file    Gets together: Not on file    Attends religious service: Not on file     Active member of club or organization: Not on file    Attends meetings of clubs or organizations: Not on file    Relationship status: Not on file  . Intimate partner violence:    Fear of current or ex partner: Not on file    Emotionally abused: Not on file    Physically abused: Not on file    Forced sexual activity: Not on file  Other Topics Concern  . Not on file  Social History Narrative  . Not on file    No Known Allergies  Family History  Problem Relation Age of Onset  . Emphysema Mother   . Cirrhosis Father     Prior to Admission medications   Medication Sig Start Date End Date Taking? Authorizing Provider  diphenhydramine-acetaminophen (TYLENOL PM) 25-500 MG TABS tablet Take 1 tablet by mouth at bedtime as needed (for sleep).    [provider]  divalproex (DEPAKOTE) 500 MG DR tablet Take 1 tablet (500 mg total) by mouth 2 (two) times daily. 03/27/18 04/26/18  Dessa Phi, DO  folic acid (FOLVITE) 1 MG tablet Take 1 tablet (1 mg total) by mouth daily. 01/28/18   Cristal Ford, DO  Multiple Vitamin (MULTIVITAMIN WITH MINERALS) TABS tablet Take 1 tablet by mouth daily. 01/28/18   Cristal Ford, DO  thiamine 100 MG tablet Take 1 tablet (100 mg total) by mouth daily. 01/28/18   Cristal Ford, DO    Physical Exam: Vitals:   05/11/18 1200 05/11/18 1215 05/11/18 1230 05/11/18 1245  BP: (!) 188/113 (!) 191/124 (!) 186/119 (!) 192/115  Pulse:   83 85  Resp: 16 16 15 18   SpO2:   99% 99%     General:  Appears calm and comfortable sitting up in bed in no acute distress Eyes:  PERRL, EOMI, normal lids, iris are moist and pink face appears a little puffy ENT:  grossly normal hearing, lips & tongue, mucous membranes of his mouth Neck:  no LAD, masses or thyromegaly Cardiovascular:  RRR, no m/r/g. trace LE edema.  Respiratory:  No increased work of breathing. Breath sounds are quite diminished at here crackles bilaterally to mid lobes. No wheezing Abdomen:  soft, ntnd,  positive bowel sounds throughout no guarding or rebounding Skin:  no rash or induration seen on limited exam Musculoskeletal:  grossly normal tone BUE/BLE, good ROM, no bony abnormality Psychiatric:  grossly normal mood and affect, speech fluent and appropriate, AOx3 Neurologic:  CN 2-12 grossly intact, moves all extremities in coordinated fashion, sensation intact  Labs on Admission: I have personally reviewed following labs and imaging studies  CBC: Recent Labs  Lab 05/11/18 1014  WBC 8.4  NEUTROABS 3.8  HGB 11.2*  HCT 35.2*  MCV 107.3*  PLT 875   Basic Metabolic Panel: Recent Labs  Lab 05/11/18 1014  NA 140  K 4.8  CL 105  CO2 16*  GLUCOSE 148*  BUN 66*  CREATININE 11.10*  CALCIUM 8.5*   GFR: Estimated Creatinine Clearance: 6.9 mL/min (A) (by C-G formula based on SCr of 11.1 mg/dL (H)). Liver Function Tests: Recent Labs  Lab 05/11/18 1014  AST 18  ALT 10  ALKPHOS 53  BILITOT 0.7  PROT 7.6  ALBUMIN 3.2*   No results for input(s): LIPASE, AMYLASE in the last 168 hours. No results for input(s): AMMONIA in the last 168 hours. Coagulation Profile: No results for input(s): INR, PROTIME in the last 168 hours. Cardiac Enzymes: No results for input(s): CKTOTAL, CKMB, CKMBINDEX, TROPONINI in the last 168 hours. BNP (last 3 results) No results for input(s): PROBNP in the last 8760 hours. HbA1C: No results for input(s): HGBA1C in the last 72 hours. CBG: No results for input(s): GLUCAP in the last 168 hours. Lipid Profile: No results for input(s): CHOL, HDL, LDLCALC, TRIG, CHOLHDL, LDLDIRECT in the last 72 hours. Thyroid Function Tests: No results for input(s): TSH, T4TOTAL, FREET4, T3FREE, THYROIDAB in the last 72 hours. Anemia Panel: No results for input(s): VITAMINB12, FOLATE, FERRITIN, TIBC, IRON, RETICCTPCT in the last 72 hours. Urine analysis:    Component Value Date/Time   COLORURINE YELLOW 05/12/2016 0834   APPEARANCEUR CLOUDY (A) 05/12/2016 0834    LABSPEC 1.014 05/12/2016 0834   PHURINE 5.0 05/12/2016 0834   GLUCOSEU NEGATIVE 05/12/2016 0834   HGBUR MODERATE (A) 05/12/2016 0834   BILIRUBINUR NEGATIVE 05/12/2016 0834   KETONESUR NEGATIVE 05/12/2016 0834   PROTEINUR 100 (A) 05/12/2016 0834   UROBILINOGEN 0.2 12/09/2013 0013   NITRITE NEGATIVE 05/12/2016 0834   LEUKOCYTESUR NEGATIVE 05/12/2016 0834    Creatinine Clearance: Estimated Creatinine Clearance: 6.9 mL/min (A) (by C-G formula based on SCr of 11.1 mg/dL (H)).  Sepsis Labs: @LABRCNTIP (procalcitonin:4,lacticidven:4) )No results found for this or any previous visit (from the past 240 hour(s)).   Radiological Exams on Admission: Dg Chest Port 1 View  Result Date: 05/11/2018 CLINICAL DATA:  Shortness of breath.  History of CHF. EXAM: PORTABLE CHEST 1 VIEW COMPARISON:  03/21/2018. FINDINGS: Cardiomegaly with diffuse bilateral pulmonary interstitial prominence consistent with CHF. No pleural effusion or pneumothorax. IMPRESSION: Cardiomegaly with diffuse bilateral from interstitial prominence consistent with CHF. Electronically Signed   By: Marcello Moores  Register   On: 05/11/2018 10:20    EKG: Independently reviewed. Sinus tachycardia Ventricular premature complex Anteroseptal infarct, old Minimal ST depression, lateral leads No significant change since last tracing  Assessment/Plan Principal Problem:   Acute on chronic respiratory failure (HCC) Active Problems:   Hypertensive emergency   ESRD on hemodialysis ([place])   Acute on chronic diastolic CHF (congestive heart failure) (HCC)   Pulmonary edema   #1. Acute on chronic respiratory failure likely related to acute on chronic combined diastolic systolic heart failure in the setting of uncontrolled blood pressure and dietary noncompliance. Initially oxygen saturation level undetectable so EMS placed patient on Cpap. Chest x-ray reveals cardiomegaly with diffuse bilateral from interstitial prominence consistent with CHF. BNP 2701.  Patient transition to BiPAP in the emergency department. Provided nitroglycerin drip and respiratory status improved. At the time of admission oxygen saturation level 98% on 3 L nasal cannula -To step down -Continue nitroglycerin drip -Continue oxygen supplementation -Monitor oxygen saturation level -Improved blood pressure control -Dialysis per nephrology  #2. Hypertensive emergency. Reports compliance with medication and dialysis.blood pressure 192/115 at the time of admission. He is provided with a nitroglycerin drip in the emergency department. Patient discharged last month after a prolonged hospitalization complicated with PEA. Discharge summary indicates patient  formally on lisinopril metoprolol amlodipine all of which were discontinued at the time of discharge. No other antihypertensive medications listed. Note indicates volume to be managed with dialysis -Continue nitroglycerin drip -Resume amlodipine -Dialysis -Monitor -Will likely need more than one agent for adequate control  #3. Acute on chronic combined systolic diastolic heart failure secondary to uncontrolled blood pressure in setting of flash pulmonary edema. Patient reports compliance with hemodialysis. He is a Tuesday Thursday Saturday dialysis patient. Echo in May of this year reveals severe concentric hypertrophy grade 2 diastolic dysfunction -volume and blood pressure managed by dialysis -Dialysis per nephrology  #4. End-stage renal disease a Tuesday Thursday Saturday dialysis patient. Due for dialysis today. Reports compliance with dialysis. Also reports noncompliance with dietary restrictions. Reacting greater than 11. He reports he makes urine about twice a day. -Continue home meds -Dialysis per nephrology   DVT prophylaxis: heparin  Code Status: full  Family Communication: none present  Disposition Plan: home  Consults called: [facility]  Admission status: obs    Dyanne Carrel M MD [facility]  If 7PM-7AM, please contact night-coverage [email or web address] Password [facility]  05/11/2018, 12:56 PM

## 2018-05-11 NOTE — Procedures (Signed)
I was present at this dialysis session. I have reviewed the session itself and made appropriate changes.   Set UF at 6L, will need HD again tomorrow for > 10kg above EDW which probably is close to correct.  He appears to eat quite a bit of salt, including eating out of his palm. Discussed at length.    There were no vitals filed for this visit.  Recent Labs  Lab 05/11/18 1014  NA 140  K 4.8  CL 105  CO2 16*  GLUCOSE 148*  BUN 66*  CREATININE 11.10*  CALCIUM 8.5*    Recent Labs  Lab 05/11/18 1014  WBC 8.4  NEUTROABS 3.8  HGB 11.2*  HCT 35.2*  MCV 107.3*  PLT 271    Scheduled Meds: . amLODipine  5 mg Oral Daily  . [START ON 05/12/2018] Chlorhexidine Gluconate Cloth  6 each Topical Q0600  . divalproex  500 mg Oral BID  . folic acid  1 mg Oral Daily  . heparin  5,000 Units Subcutaneous Q8H  . multivitamin with minerals  1 tablet Oral Daily  . sodium chloride flush  3 mL Intravenous Q12H  . thiamine  100 mg Oral Daily   Continuous Infusions: . sodium chloride     PRN Meds:.sodium chloride, acetaminophen **OR** acetaminophen, diphenhydramine-acetaminophen, ondansetron **OR** ondansetron (ZOFRAN) IV, traZODone   Pearson Grippe  MD 05/11/2018, 1:52 PM

## 2018-05-11 NOTE — ED Provider Notes (Signed)
[facility] EMERGENCY DEPARTMENT Provider Note   CSN: [id number] Arrival date & time: 05/11/18  1000     History   Chief Complaint Chief Complaint  Patient presents with  . Respiratory Distress    HPI Kenneth [name] is a 62 y.o. male hx of CHF, ESRD on HD (last HD was Sat), here presenting with shortness of breath.  Patient woke up this morning and had acute onset of shortness of breath. States that he was unable to breathe and EMS was called.  They were unable to get a pulse ox on him so put him on CPAP.  After being on CPAP, patient felt better.  Patient states that he had previous pulmonary edema and today he is supposed to go to dialysis.  Denies any fevers or chills or cough.  The history is provided by the patient and the EMS personnel.   Level V caveat- AMS   Past Medical History:  Diagnosis Date  . Acute CHF ([place]) 01/2018  . Cardiomyopathy secondary    likely related to HTN heart disease; possibly ETOH related as well  . Chronic combined systolic and diastolic heart failure (HCC)    Echocardiogram 09/22/11: Moderate LVH, EF 47-82%, grade 3 diastolic dysfunction, mild MR, moderate to severe LAE, mild RVE, mild to moderate TR, small to moderate pericardial effusion  . ESRD (end stage renal disease) on dialysis [facility])    due to hypertensive nephrosclerosis  . History of alcohol abuse   . Hypertension     Patient Active Problem List   Diagnosis Date Noted  . Cardiac arrest ([place]) 03/09/2018  . Acute encephalopathy   . Acute on chronic respiratory failure ([place]) 02/16/2018  . Tobacco dependence 02/16/2018  . Pulmonary edema 01/26/2018  . Acute respiratory failure with hypoxia ([place]) 01/26/2018  . Atrial fibrillation and flutter ([place])   . Shortness of breath   . Acute on chronic diastolic CHF (congestive heart failure) ([place])   . Hypothermia 10/08/2016  . ESRD on hemodialysis ([place]) 10/08/2016  . Fall 10/08/2016  . Syncope 10/07/2016  . Near syncope  07/07/2016  . Cardiomyopathy ([place]) 07/07/2016  . Ventricular tachycardia, non-sustained ([place]) 07/06/2016  . Hypoglycemia 07/06/2016  . Alcohol abuse 05/13/2016  . Hypertensive emergency 12/08/2013  . Cardiomyopathy secondary 10/09/2011  . Essential hypertension 10/09/2011  . Alcohol withdrawal delirium ([place]) 09/25/2011  . Chronic combined systolic and diastolic CHF (congestive heart failure) ([place]) 09/24/2011    Past Surgical History:  Procedure Laterality Date  . AV FISTULA PLACEMENT Left 05/14/2016   Procedure: LEFT ARM BASILIC VEIN TRANSPOSITION;  Surgeon: Rosetta Posner, MD;  Location: [facility];  Service: Vascular;  Laterality: Left;  . PERIPHERAL VASCULAR CATHETERIZATION N/A 05/13/2016   Procedure: Dialysis/Perma Catheter Insertion;  Surgeon: Serafina Mitchell, MD;  Location: [facility] CV LAB;  Service: Cardiovascular;  Laterality: N/A;        Home Medications    Prior to Admission medications   Medication Sig Start Date End Date Taking? Authorizing Provider  diphenhydramine-acetaminophen (TYLENOL PM) 25-500 MG TABS tablet Take 1 tablet by mouth at bedtime as needed (for sleep).    [provider]  divalproex (DEPAKOTE) 500 MG DR tablet Take 1 tablet (500 mg total) by mouth 2 (two) times daily. 03/27/18 04/26/18  Dessa Phi, DO  folic acid (FOLVITE) 1 MG tablet Take 1 tablet (1 mg total) by mouth daily. 01/28/18   Cristal Ford, DO  Multiple Vitamin (MULTIVITAMIN WITH MINERALS) TABS tablet Take 1 tablet by  mouth daily. 01/28/18   Cristal Ford, DO  thiamine 100 MG tablet Take 1 tablet (100 mg total) by mouth daily. 01/28/18   Cristal Ford, DO    Family History Family History  Problem Relation Age of Onset  . Emphysema Mother   . Cirrhosis Father     Social History Social History   Tobacco Use  . Smoking status: Current Every Day Smoker    Types: Cigars  . Smokeless tobacco: Former Network engineer Use Topics  . Alcohol use: Yes    Alcohol/week: 18.0 oz     Types: 30 Cans of beer per week    Frequency: Never    Comment: drinks daily  . Drug use: No     Allergies   Patient has no known allergies.   Review of Systems Review of Systems  Respiratory: Positive for shortness of breath.   All other systems reviewed and are negative.    Physical Exam Updated Vital Signs BP (!) 190/114   Pulse 83   Resp 17   SpO2 100%   Physical Exam  Constitutional: He is oriented to person, place, and time.  Tachypneic, moderate distress   HENT:  Head: Normocephalic.  Mouth/Throat: Oropharynx is clear and moist.  Eyes: Pupils are equal, round, and reactive to light. Conjunctivae and EOM are normal.  Neck: Normal range of motion. Neck supple.  Cardiovascular: Normal rate, regular rhythm and normal heart sounds.  Pulmonary/Chest:  Tachypneic, crackles bilateral bases   Abdominal: Soft. Bowel sounds are normal. He exhibits no distension. There is no tenderness.  Musculoskeletal: Normal range of motion. He exhibits edema.  Neurological: He is alert and oriented to person, place, and time.  Skin: Skin is warm.  Psychiatric: He has a normal mood and affect.  Nursing note and vitals reviewed.    ED Treatments / Results  Labs (all labs ordered are listed, but only abnormal results are displayed) Labs Reviewed  CBC WITH DIFFERENTIAL/PLATELET - Abnormal; Notable for the following components:      Result Value   RBC 3.28 (*)    Hemoglobin 11.2 (*)    HCT 35.2 (*)    MCV 107.3 (*)    MCH 34.1 (*)    RDW 16.8 (*)    Eosinophils Absolute 1.0 (*)    All other components within normal limits  COMPREHENSIVE METABOLIC PANEL - Abnormal; Notable for the following components:   CO2 16 (*)    Glucose, Bld 148 (*)    BUN 66 (*)    Creatinine, Ser 11.10 (*)    Calcium 8.5 (*)    Albumin 3.2 (*)    GFR calc non Af Amer 4 (*)    GFR calc Af Amer 5 (*)    Anion gap 19 (*)    All other components within normal limits  BRAIN NATRIURETIC PEPTIDE -  Abnormal; Notable for the following components:   B Natriuretic Peptide 2,701.6 (*)    All other components within normal limits  I-STAT ARTERIAL BLOOD GAS, ED - Abnormal; Notable for the following components:   pH, Arterial 7.273 (*)    pO2, Arterial 118.0 (*)    Bicarbonate 19.6 (*)    TCO2 21 (*)    Acid-base deficit 7.0 (*)    All other components within normal limits  I-STAT CG4 LACTIC ACID, ED - Abnormal; Notable for the following components:   Lactic Acid, Venous 5.04 (*)    All other components within normal limits  BLOOD GAS, ARTERIAL  VALPROIC ACID LEVEL  I-STAT TROPONIN, ED  I-STAT CHEM 8, ED    EKG EKG Interpretation  Date/Time:  Tuesday May 11 2018 10:00:53 EDT Ventricular Rate:  102 PR Interval:    QRS Duration: 93 QT Interval:  369 QTC Calculation: 481 R Axis:   44 Text Interpretation:  Sinus tachycardia Ventricular premature complex Anteroseptal infarct, old Minimal ST depression, lateral leads No significant change since last tracing Confirmed by Wandra Arthurs ([id number]) on 05/11/2018 10:21:09 AM   Radiology Dg Chest Port 1 View  Result Date: 05/11/2018 CLINICAL DATA:  Shortness of breath.  History of CHF. EXAM: PORTABLE CHEST 1 VIEW COMPARISON:  03/21/2018. FINDINGS: Cardiomegaly with diffuse bilateral pulmonary interstitial prominence consistent with CHF. No pleural effusion or pneumothorax. IMPRESSION: Cardiomegaly with diffuse bilateral from interstitial prominence consistent with CHF. Electronically Signed   By: Marcello Moores  Register   On: 05/11/2018 10:20    Procedures Procedures (including critical care time)  CRITICAL CARE Performed by: Wandra Arthurs   Total critical care time: 30 minutes  Critical care time was exclusive of separately billable procedures and treating other patients.  Critical care was necessary to treat or prevent imminent or life-threatening deterioration.  Critical care was time spent personally by me on the following activities:  development of treatment plan with patient and/or surrogate as well as nursing, discussions with consultants, evaluation of patient's response to treatment, examination of patient, obtaining history from patient or surrogate, ordering and performing treatments and interventions, ordering and review of laboratory studies, ordering and review of radiographic studies, pulse oximetry and re-evaluation of patient's condition.   Medications Ordered in ED Medications  Chlorhexidine Gluconate Cloth 2 % PADS 6 each (has no administration in time range)  nitroGLYCERIN 50 mg in dextrose 5 % 250 mL (0.2 mg/mL) infusion (10 mcg/min Intravenous Rate/Dose Change 05/11/18 1048)     Initial Impression / Assessment and Plan / ED Course  I have reviewed the triage vital signs and the nursing notes.  Pertinent labs & imaging results that were available during my care of the patient were reviewed by me and considered in my medical decision making (see chart for details).     Kenneth [name] is a 62 y.o. male here with SOB. Patient appears to be in acute pulmonary edema. Will continue bipap and get ABG. He is hypertensive 220/110. Will start nitro drip to help with chest pain and hypertension. Will consult nephrology.  12:31 PM Labs showed K 4.8. Cr 11. CXR showed pulmonary edema. I called Dr. Joelyn Oms from nephrology to dialyze patient. I was able to take patient off bipap and now on 3 L [place]. Hospitalist to admit.    Final Clinical Impressions(s) / ED Diagnoses   Final diagnoses:  None    ED Discharge Orders    None       Drenda Freeze, MD 05/11/18 1231

## 2018-05-11 NOTE — Care Management Note (Signed)
Case Management Note  CM contacted by Dorian Pod with [facility] who advised pt's family contacted [facility] requesting their services on D/C if pt leaves with HH orders.  She will follow for needs.  Darius Lundberg, Benjaman Lobe, RN 05/11/2018, 2:00 PM

## 2018-05-11 NOTE — ED Triage Notes (Signed)
Pt arrives in respiratory distress on Cpap with [facility], pt found pt at home extremely sob with crackles and rales applied CPAP. Pt received 1 SL nitro en route.

## 2018-05-12 ENCOUNTER — Encounter (HOSPITAL_COMMUNITY): Payer: Self-pay | Admitting: General Practice

## 2018-05-12 ENCOUNTER — Other Ambulatory Visit: Payer: Self-pay

## 2018-05-12 DIAGNOSIS — Z23 Encounter for immunization: Secondary | ICD-10-CM | POA: Diagnosis not present

## 2018-05-12 DIAGNOSIS — I5043 Acute on chronic combined systolic (congestive) and diastolic (congestive) heart failure: Secondary | ICD-10-CM | POA: Diagnosis not present

## 2018-05-12 DIAGNOSIS — I132 Hypertensive heart and chronic kidney disease with heart failure and with stage 5 chronic kidney disease, or end stage renal disease: Secondary | ICD-10-CM | POA: Diagnosis not present

## 2018-05-12 DIAGNOSIS — J810 Acute pulmonary edema: Secondary | ICD-10-CM | POA: Diagnosis not present

## 2018-05-12 DIAGNOSIS — N186 End stage renal disease: Secondary | ICD-10-CM | POA: Diagnosis not present

## 2018-05-12 DIAGNOSIS — I429 Cardiomyopathy, unspecified: Secondary | ICD-10-CM | POA: Diagnosis not present

## 2018-05-12 LAB — CBC
HCT: 34.2 % — ABNORMAL LOW (ref 39.0–52.0)
HEMOGLOBIN: 11.8 g/dL — AB (ref 13.0–17.0)
MCH: 35.1 pg — ABNORMAL HIGH (ref 26.0–34.0)
MCHC: 34.5 g/dL (ref 30.0–36.0)
MCV: 101.8 fL — ABNORMAL HIGH (ref 78.0–100.0)
Platelets: 286 10*3/uL (ref 150–400)
RBC: 3.36 MIL/uL — AB (ref 4.22–5.81)
RDW: 16.5 % — ABNORMAL HIGH (ref 11.5–15.5)
WBC: 6.1 10*3/uL (ref 4.0–10.5)

## 2018-05-12 LAB — BASIC METABOLIC PANEL
ANION GAP: 14 (ref 5–15)
BUN: 32 mg/dL — ABNORMAL HIGH (ref 8–23)
CALCIUM: 8.8 mg/dL — AB (ref 8.9–10.3)
CO2: 28 mmol/L (ref 22–32)
CREATININE: 7.46 mg/dL — AB (ref 0.61–1.24)
Chloride: 98 mmol/L (ref 98–111)
GFR, EST AFRICAN AMERICAN: 8 mL/min — AB (ref >60)
GFR, EST NON AFRICAN AMERICAN: 7 mL/min — AB (ref >60)
Glucose, Bld: 79 mg/dL (ref 70–99)
Potassium: 4.3 mmol/L (ref 3.5–5.1)
SODIUM: 140 mmol/L (ref 135–145)

## 2018-05-12 MED ORDER — HEPARIN SODIUM (PORCINE) 1000 UNIT/ML DIALYSIS: 1000.0000 [IU] | INTRAMUSCULAR | Status: DC | PRN

## 2018-05-12 MED ORDER — ALTEPLASE 2 MG IJ SOLR: 2.0000 mg | Freq: Once | INTRAMUSCULAR | Status: DC | PRN

## 2018-05-12 MED ORDER — PENTAFLUOROPROP-TETRAFLUOROETH EX AERO: 1.0000 "application " | INHALATION_SPRAY | CUTANEOUS | Status: DC | PRN

## 2018-05-12 MED ORDER — LIDOCAINE-PRILOCAINE 2.5-2.5 % EX CREA: 1.0000 "application " | TOPICAL_CREAM | CUTANEOUS | Status: DC | PRN

## 2018-05-12 MED ORDER — HEPARIN SODIUM (PORCINE) 1000 UNIT/ML DIALYSIS: 2400.0000 [IU] | INTRAMUSCULAR | Status: DC | PRN

## 2018-05-12 MED ORDER — LIDOCAINE HCL (PF) 1 % IJ SOLN: 5.0000 mL | INTRAMUSCULAR | Status: DC | PRN

## 2018-05-12 MED ORDER — SODIUM CHLORIDE 0.9 % IV SOLN: 100.0000 mL | INTRAVENOUS | Status: DC | PRN

## 2018-05-12 MED ORDER — RESOURCE THICKENUP CLEAR PO POWD
ORAL | Status: DC | PRN
  Filled 2018-05-12: qty 125

## 2018-05-12 MED ORDER — HYDRALAZINE HCL 20 MG/ML IJ SOLN
10.0000 mg | Freq: Once | INTRAMUSCULAR | Status: DC
  Filled 2018-05-12: qty 1

## 2018-05-12 NOTE — Progress Notes (Signed)
Patient had some frequent PVC's and bigeminy with 3 beat runs of Vtach. Dr. Wyline Copas on unit and ordered EKG. EKG showed long QTC. Holding off for now on discharge.

## 2018-05-12 NOTE — Progress Notes (Signed)
Paged Schorr, NP and made aware about patient BP 188/92. Orders placed. will continue to monitor and make NP aware of any changes.

## 2018-05-12 NOTE — Progress Notes (Signed)
[facility] Progress Note   Subjective:   Sitting in bed eating breakfast.  States he is breathing better.  No new complaints.   Objective Vitals:   05/12/18 0310 05/12/18 0359 05/12/18 0524 05/12/18 0832  BP: (!) 156/76   (!) 156/91  Pulse: 79     Resp: 17     Temp:  98.9 F (37.2 C)    TempSrc:  Oral    SpO2: 95%     Weight:   85.7 kg (188 lb 15 oz)    Physical Exam General:NAD, chronically ill appearing male Heart:RRR, no mrg Lungs:mostly CTAB Abdomen:soft, NTND Extremities: no LE edema Dialysis Access: aneurysmal LU AVF +b/t   Filed Weights   05/11/18 1744 05/12/18 0524  Weight: 86 kg (189 lb 9.5 oz) 85.7 kg (188 lb 15 oz)    Intake/Output Summary (Last 24 hours) at 05/12/2018 1023 Last data filed at 05/12/2018 0300 Gross per 24 hour  Intake 0 ml  Output 6000 ml  Net -6000 ml    Additional Objective Labs: Basic Metabolic Panel: Recent Labs  Lab 05/11/18 1014 05/12/18 0529  NA 140 140  K 4.8 4.3  CL 105 98  CO2 16* 28  GLUCOSE 148* 79  BUN 66* 32*  CREATININE 11.10* 7.46*  CALCIUM 8.5* 8.8*   Liver Function Tests: Recent Labs  Lab 05/11/18 1014  AST 18  ALT 10  ALKPHOS 53  BILITOT 0.7  PROT 7.6  ALBUMIN 3.2*   CBC: Recent Labs  Lab 05/11/18 1014 05/12/18 0529  WBC 8.4 6.1  NEUTROABS 3.8  --   HGB 11.2* 11.8*  HCT 35.2* 34.2*  MCV 107.3* 101.8*  PLT 271 286   Blood Culture    Component Value Date/Time   SDES URINE, CLEAN CATCH 05/12/2016 0824   SPECREQUEST NONE 05/12/2016 0824   CULT (A) 05/12/2016 0824    7,000 COLONIES/mL INSIGNIFICANT GROWTH Performed at [facility] 05/13/2016 FINAL 05/12/2016 0824    Lab Results  Component Value Date   INR 1.15 03/09/2018   INR 1.06 03/09/2018   INR 1.01 10/07/2016   Studies/Results: Dg Chest Port 1 View  Result Date: 05/11/2018 CLINICAL DATA:  Shortness of breath.  History of CHF. EXAM: PORTABLE CHEST 1 VIEW COMPARISON:  03/21/2018. FINDINGS:  Cardiomegaly with diffuse bilateral pulmonary interstitial prominence consistent with CHF. No pleural effusion or pneumothorax. IMPRESSION: Cardiomegaly with diffuse bilateral from interstitial prominence consistent with CHF. Electronically Signed   By: [place]   On: 05/11/2018 10:20    Medications: . sodium chloride     . amLODipine  10 mg Oral Daily  . Chlorhexidine Gluconate Cloth  6 each Topical Q0600  . Chlorhexidine Gluconate Cloth  6 each Topical Q0600  . divalproex  500 mg Oral BID  . folic acid  1 mg Oral Daily  . heparin  5,000 Units Subcutaneous Q8H  . hydrALAZINE  10 mg Intravenous Once  . metoprolol tartrate  12.5 mg Oral BID  . multivitamin with minerals  1 tablet Oral Daily  . sodium chloride flush  3 mL Intravenous Q12H  . thiamine  100 mg Oral Daily    Dialysis Orders: TTS - [facility] [address]  4.25hrs, BFR 400, DFR 800,  EDW 79.5kg, 2K/ 2Ca  Access: LU AVF  Heparin 2400 Unit bolus Mircera 75 mcg q2wks - last 7/11 No VDRA Sensipar 30mg  qHD TIW   Assessment/Plan: 1.  Acute pulmonary edema 2/2 volume overload d/t noncompliance with  fluid restrictions- Improvement in volume post HD, net 6L UF removed.  Remains 6L over edw, extra HD scheduled for today for excess volume removal. 2. Acute on chronic combined diastolic & systolic CHF - improved post HD. 3.  ESRD -  TTS HD.  Compliant with HD but chronically large IDWG.  K 4.3.  Orders written for additional HD today for excess volume removal with net UF goal 6L. 4.  Hypertension - BP remains elevated. Started on amlodipine & metoprolol. Hold BP meds prior to HD to facilitate volume removal.  Expect continued improvement with volume removal by HD.  Not on antihypertensives at home per OP records.  5.  Anemia of CKD - Hgb 11.8. No indications for ESA at this time.  6.  Secondary Hyperparathyroidism -  Ca in goal. Phos mildly elevated as OP.  Continue binders and sensipar. Not on VDRA. 7.  Nutrition - Alb 3.2. Renal  diet with fluid restrictions. Nepro. Renavite. 8. HX PEA arrest- during last admit 9. Hx ETOH abuse 10. Hx A. Flutter - not on anticoagulation      Jen Mow, PA-C [facility] Pager: [phone] 05/12/2018,10:23 AM  LOS: 0 days

## 2018-05-12 NOTE — Progress Notes (Signed)
Kenneth [name] to be D/C'd Home per MD order. Per ginger previous RN Dr. Wyline Copas said patient was cleared to discharge. Discussed with the patient and all questions fully answered.   Allergies as of 05/12/2018   No Known Allergies     Medication List    STOP taking these medications   lisinopril 2.5 MG tablet Commonly known as:  PRINIVIL,ZESTRIL     TAKE these medications   amLODipine 10 MG tablet Commonly known as:  NORVASC Take 10 mg by mouth at bedtime.   aspirin EC 81 MG tablet Take 81 mg by mouth daily.   diphenhydramine-acetaminophen 25-500 MG Tabs tablet Commonly known as:  TYLENOL PM Take 1 tablet by mouth at bedtime as needed (for sleep).   divalproex 500 MG DR tablet Commonly known as:  DEPAKOTE Take 1 tablet (500 mg total) by mouth 2 (two) times daily.   folic acid 1 MG tablet Commonly known as:  FOLVITE Take 1 tablet (1 mg total) by mouth daily.   metoprolol tartrate 25 MG tablet Commonly known as:  LOPRESSOR Take 12.5 mg by mouth 2 (two) times daily.   multivitamin with minerals Tabs tablet Take 1 tablet by mouth daily.   thiamine 100 MG tablet Take 1 tablet (100 mg total) by mouth daily.       VVS, Skin clean, dry and intact without evidence of skin break down, no evidence of skin tears noted. IV catheter discontinued intact. Site without signs and symptoms of complications. Dressing and pressure applied.  An After Visit Summary was printed and given to the patient.  D/c education completed with patient/family including follow up instructions, medication list, d/c activities limitations if indicated, with other d/c instructions as indicated by MD - patient able to verbalize understanding, all questions fully answered.   Patient instructed to return to ED, call 911, or call MD for any changes in condition.   Patient escorted via WC, and D/C home via taxis.  Kenneth [name], [place] M 05/12/2018 9:10 PM

## 2018-05-12 NOTE — Discharge Summary (Addendum)
Physician Discharge Summary  Kenneth [name] KNL:[id number] DOB: 1956/08/18 DOA: 05/11/2018  PCP: Kenneth Ruddy, MD  Admit date: 05/11/2018 Discharge date: 05/12/2018  Admitted From: Home Disposition:  home  Recommendations for Outpatient Follow-up:  1. Follow up with PCP in 1-2 weeks:  Discharge Condition:Stable CODE STATUS:Full Diet recommendation: Renal   Brief/Interim Summary: 62 y.o. male with medical history significant for end-stage renal disease a Tuesday Thursday Saturday dialysis patient, combined systolic diastolic heart failure with frequent flash pulmonary edema, hypertension presents to the emergency department with the chief complaint of shortness of breath. Initial evaluation reveals acute respiratory failure secondary to acute on chronic heart failure in the setting of uncontrolled blood pressure and dietary noncompliance.  Information is obtained from the chart and the patient. Patient states he was in his usual state of health until about 2 hours before he presented he developed sudden shortness of breath. EMS called he was in obvious respiratory distress and placed on C Pap. He also received one sublingual nitroglycerin. He denies chest pain palpitations headache dizziness syncope or near-syncope. He denies any abdominal pain nausea vomiting. He denies dysuria hematuria frequency or urgency. He reports he is due for dialysis today. He also states he has a history of being hospitalized for pulmonary edema. He reports he eats salt right out of his hand. He goes to dialysis on [employer]    ED Course: in the emergency department he's afebrile hemodynamically stable on Bipap. Now he is weaned off of BiPAP and placed on 3 L nasal cannula. At the time of admission there is no increased work of breathing oxygen saturation level is 99% on 3 L nasal cannula. He's also provided with a nitroglycerin drip  #1. Acute on chronic respiratory failure likely related to acute on  chronic combined diastolic systolic heart failure in the setting of uncontrolled blood pressure and dietary noncompliance. Initially oxygen saturation level undetectable so EMS placed patient on Cpap. Chest x-ray reveals cardiomegaly with diffuse bilateral from interstitial prominence consistent with CHF. BNP 2701. Patient transition to BiPAP in the emergency department. Provided nitroglycerin drip and respiratory status improved. At the time of admission oxygen saturation level 98% on 3 L nasal cannula -O2 weaned to minimal O2 support -Patient continued on HD per Nephrology  #2. Hypertensive emergency. Reports compliance with medication and dialysis.blood pressure 192/115 at the time of admission. He is provided with a nitroglycerin drip in the emergency department. Patient discharged last month after a prolonged hospitalization complicated with PEA. Discharge summary indicates patient formally on lisinopril metoprolol amlodipine all of which were discontinued at the time of discharge. No other antihypertensive medications listed. Note indicates volume to be managed with dialysis -Improved with nitroglycerin drip -Resumed amlodipine and metoprolol.  -Continued on dialysis -Most recent bp 129/76  #3. Acute on chronic combined systolic diastolic heart failure secondary to uncontrolled blood pressure in setting of flash pulmonary edema. Patient reports compliance with hemodialysis. He is a Tuesday Thursday Saturday dialysis patient. Echo in May of this year reveals severe concentric hypertrophy grade 2 diastolic dysfunction -volume and blood pressure managed by dialysis -Dialysis was continued per nephrology  #4. End-stage renal disease a Tuesday Thursday Saturday dialysis patient. -HD given this hospital visit -Reports compliance with dialysis. Also reports noncompliance with dietary restrictions.  -Dialysis per nephrology -Resume ususal scheduled HD as of 05/13/18  #5 Abnormal EKG -abnormal  rhythm noted on monitor post-hd -Obtained EKG and reviewed/discussed case with Cardiologist on call. Patient asymptomatic -No further work up  was recommended by Cardiologist on call   Discharge Diagnoses:  Principal Problem:   Acute on chronic respiratory failure [facility]) Active Problems:   Hypertensive emergency   ESRD on hemodialysis ([name])   Acute on chronic diastolic CHF (congestive heart failure) ([name])   Pulmonary edema    Discharge Instructions   Allergies as of 05/12/2018   No Known Allergies     Medication List    TAKE these medications   amLODipine 10 MG tablet Commonly known as:  NORVASC Take 10 mg by mouth at bedtime.   aspirin EC 81 MG tablet Take 81 mg by mouth daily.   diphenhydramine-acetaminophen 25-500 MG Tabs tablet Commonly known as:  TYLENOL PM Take 1 tablet by mouth at bedtime as needed (for sleep).   divalproex 500 MG DR tablet Commonly known as:  DEPAKOTE Take 1 tablet (500 mg total) by mouth 2 (two) times daily.   folic acid 1 MG tablet Commonly known as:  FOLVITE Take 1 tablet (1 mg total) by mouth daily.   lisinopril 2.5 MG tablet Commonly known as:  PRINIVIL,ZESTRIL Take 2.5 mg by mouth daily.   metoprolol tartrate 25 MG tablet Commonly known as:  LOPRESSOR Take 12.5 mg by mouth 2 (two) times daily.   multivitamin with minerals Tabs tablet Take 1 tablet by mouth daily.   thiamine 100 MG tablet Take 1 tablet (100 mg total) by mouth daily.      Follow-up Information    Kenneth Ruddy, MD. Schedule an appointment as soon as possible for a visit in 1 week(s).   Specialty:  Family Medicine Contact information: [name] [address] [phone]        [name], [name], MD .   Specialty:  Cardiology Contact information: [address] [place] [address] [phone]          No Known Allergies  Consultations:  Nephrology  Procedures/Studies: Dg Op Swallowing  Func-medicare/speech Path  Result Date: 04/19/2018 Objective Swallowing Evaluation: Type of Study: MBS-Modified Barium Swallow Study  Patient Details Name: Kenneth [name] MRN: [id number] Date of Birth: [birth year] Today's Date: 04/19/2018 Time: SLP Start Time (ACUTE ONLY): 62 -SLP Stop Time (ACUTE ONLY): 1148 SLP Time Calculation (min) (ACUTE ONLY): 18 min Past Medical History: Past Medical History: Diagnosis Date . Acute CHF ([place]) 01/2018 . Cardiomyopathy secondary   likely related to HTN heart disease; possibly ETOH related as well . Chronic combined systolic and diastolic heart failure (HCC)   Echocardiogram 09/22/11: Moderate LVH, EF 40-08%, grade 3 diastolic dysfunction, mild MR, moderate to severe LAE, mild RVE, mild to moderate TR, small to moderate pericardial effusion . ESRD (end stage renal disease) on dialysis [facility])   due to hypertensive nephrosclerosis . History of alcohol abuse  . Hypertension  Past Surgical History: Past Surgical History: Procedure Laterality Date . AV FISTULA PLACEMENT Left 05/14/2016  Procedure: LEFT ARM BASILIC VEIN TRANSPOSITION;  Surgeon: Rosetta Posner, MD;  Location: [facility];  Service: Vascular;  Laterality: Left; . PERIPHERAL VASCULAR CATHETERIZATION N/A 05/13/2016  Procedure: Dialysis/Perma Catheter Insertion;  Surgeon: Serafina Mitchell, MD;  Location: [place] CV LAB;  Service: Cardiovascular;  Laterality: N/A; HPI: Pt is a 62 year old male arriving from SNF after recent admission for cardia arrest with 10 minutes of CPR and 7 day intubation, followed by severe oral/cognitive based dysphagia. Underwent MBS on 03/24/18, recommended to consume dys 1/honey thick liquids due to severe oral holding and delayed swallow. He has continued on  this diet up until now.  Subjective: Pt seen in radiology for MBS Assessment / Plan / Recommendation CHL IP CLINICAL IMPRESSIONS 04/19/2018 Clinical Impression  Pt demonstrates excellent resolution of dysphagia following acute illness. Pt now with only  mild delay in swallow initiation with large sips of thin liquids, which significantly improved with further trials during exam. There was one instance of trace sensed aspiration before the swallow with early sip. Pt was given full container of thin barium over the course of the study and challenged to consume at a rapid rate, via cup and straw and with mixed consistency and did not repeat aspiration event. Pt did noteably improve timing of swallow with cues for a small sip and brief oral hold. Mastication WNL. Would suggest upgrade to regular thin with f/u from SLP regarding strategies and precautions.  SLP Visit Diagnosis Dysphagia, oropharyngeal phase (R13.12) Attention and concentration deficit following -- Frontal lobe and executive function deficit following -- Impact on safety and function Mild aspiration risk   CHL IP TREATMENT RECOMMENDATION 04/19/2018 Treatment Recommendations Defer treatment plan to f/u with SLP   Prognosis 03/24/2018 Prognosis for Safe Diet Advancement Fair Barriers to Reach Goals Cognitive deficits Barriers/Prognosis Comment -- CHL IP DIET RECOMMENDATION 04/19/2018 SLP Diet Recommendations Regular solids;Thin liquid Liquid Administration via Cup;Straw Medication Administration Whole meds with liquid Compensations Slow rate;Small sips/bites Postural Changes Seated upright at 90 degrees   CHL IP OTHER RECOMMENDATIONS 03/24/2018 Recommended Consults -- Oral Care Recommendations Oral care QID Other Recommendations Have oral suction available   CHL IP FOLLOW UP RECOMMENDATIONS 04/19/2018 Follow up Recommendations Skilled Nursing facility   [facility] IP FREQUENCY AND DURATION 03/24/2018 Speech Therapy Frequency (ACUTE ONLY) min 2x/week Treatment Duration 1 week;2 weeks      CHL IP ORAL PHASE 04/19/2018 Oral Phase WFL Oral - Pudding Teaspoon -- Oral - Pudding Cup -- Oral - Honey Teaspoon -- Oral - Honey Cup -- Oral - Nectar Teaspoon -- Oral - Nectar Cup -- Oral - Nectar Straw -- Oral - Thin Teaspoon -- Oral  - Thin Cup -- Oral - Thin Straw -- Oral - Puree -- Oral - Mech Soft -- Oral - Regular -- Oral - Multi-Consistency -- Oral - Pill -- Oral Phase - Comment --  CHL IP PHARYNGEAL PHASE 04/19/2018 Pharyngeal Phase Impaired Pharyngeal- Pudding Teaspoon -- Pharyngeal -- Pharyngeal- Pudding Cup -- Pharyngeal -- Pharyngeal- Honey Teaspoon -- Pharyngeal -- Pharyngeal- Honey Cup -- Pharyngeal -- Pharyngeal- Nectar Teaspoon -- Pharyngeal -- Pharyngeal- Nectar Cup -- Pharyngeal -- Pharyngeal- Nectar Straw -- Pharyngeal -- Pharyngeal- Thin Teaspoon -- Pharyngeal -- Pharyngeal- Thin Cup Delayed swallow initiation-pyriform sinuses;Penetration/Aspiration before swallow;Trace aspiration Pharyngeal Material enters airway, passes BELOW cords then ejected out;Material does not enter airway Pharyngeal- Thin Straw Delayed swallow initiation-pyriform sinuses;WFL;Penetration/Aspiration during swallow Pharyngeal Material enters airway, remains ABOVE vocal cords then ejected out;Material does not enter airway Pharyngeal- Puree WFL Pharyngeal -- Pharyngeal- Mechanical Soft -- Pharyngeal -- Pharyngeal- Regular -- Pharyngeal -- Pharyngeal- Multi-consistency -- Pharyngeal -- Pharyngeal- Pill -- Pharyngeal -- Pharyngeal Comment --  CHL IP CERVICAL ESOPHAGEAL PHASE 03/24/2018 Cervical Esophageal Phase WFL Pudding Teaspoon -- Pudding Cup -- Honey Teaspoon -- Honey Cup -- Nectar Teaspoon -- Nectar Cup -- Nectar Straw -- Thin Teaspoon -- Thin Cup -- Thin Straw -- Puree -- Mechanical Soft -- Regular -- Multi-consistency -- Pill -- Cervical Esophageal Comment -- No flowsheet data found. Herbie Baltimore, [place] CCC-SLP [phone] Lynann Beaver 04/19/2018, 2:11 PM  CLINICAL DATA:  Dysphagia a EXAM: MODIFIED BARIUM SWALLOW TECHNIQUE: Different consistencies of barium were administered orally to the patient by the Speech Pathologist. Imaging of the pharynx was performed in the lateral projection. FLUOROSCOPY TIME:  Fluoroscopy Time:  2 minutes,  45 seconds Radiation Exposure Index (if provided by the fluoroscopic device): Number of Acquired Spot Images: 0 COMPARISON:  None. FINDINGS: Thin liquid: Originally there was flash penetration and a single episode of tracheal aspiration of thin liquid. Some of this was due to a delayed swallowing trigger. The aspiration did trigger a cough. With numerous additional swallows of thin liquid, the penetration and aspiration seemed to resolve. Nectar consistency: Unremarkable Puree coated graham cracker: Unremarkable Barium pill: Initial mild difficulty swallowing the pill, subsequently unremarkable. IMPRESSION: 1. Initial flash penetration and an episode of tracheal aspiration with thin liquids triggering a cough. However, with repeat swallowing this appeared to resolve. Please refer to the Speech Pathologists report for complete details and recommendations. Electronically Signed   By: Van Clines M.D.   On: 04/19/2018 12:31   Dg Chest Port 1 View  Result Date: 05/11/2018 CLINICAL DATA:  Shortness of breath.  History of CHF. EXAM: PORTABLE CHEST 1 VIEW COMPARISON:  03/21/2018. FINDINGS: Cardiomegaly with diffuse bilateral pulmonary interstitial prominence consistent with CHF. No pleural effusion or pneumothorax. IMPRESSION: Cardiomegaly with diffuse bilateral from interstitial prominence consistent with CHF. Electronically Signed   By: Marcello Moores  Register   On: 05/11/2018 10:20     Subjective: Eager to go home   Discharge Exam: Vitals:   05/12/18 0832 05/12/18 1125  BP: (!) 156/91 129/76  Pulse:    Resp:  16  Temp:  97.8 F (36.6 C)  SpO2:     Vitals:   05/12/18 0524 05/12/18 0709 05/12/18 0832 05/12/18 1125  BP:  (!) 155/87 (!) 156/91 129/76  Pulse:  81    Resp:  17  16  Temp:    97.8 F (36.6 C)  TempSrc:      SpO2:  95%    Weight: 85.7 kg (188 lb 15 oz)       General: Pt is alert, awake, not in acute distress Cardiovascular: RRR, S1/S2 +, no rubs, no gallops Respiratory: CTA  bilaterally, no wheezing, no rhonchi Abdominal: Soft, NT, ND, bowel sounds + Extremities: no edema, no cyanosis   The results of significant diagnostics from this hospitalization (including imaging, microbiology, ancillary and laboratory) are listed below for reference.     Microbiology: Recent Results (from the past 240 hour(s))  MRSA PCR Screening     Status: None   Collection Time: 05/11/18  7:08 PM  Result Value Ref Range Status   MRSA by PCR NEGATIVE NEGATIVE Final    Comment:        The GeneXpert MRSA Assay (FDA approved for NASAL specimens only), is one component of a comprehensive MRSA colonization surveillance program. It is not intended to diagnose MRSA infection nor to guide or monitor treatment for MRSA infections. Performed at [facility], [place] [address], [place], [place] [address]      Labs: BNP (last 3 results) Recent Labs    01/26/18 0114 02/16/18 0648 05/11/18 1014  BNP 1,720.1* 1,594.1* 6,283.6*   Basic Metabolic Panel: Recent Labs  Lab 05/11/18 1014 05/12/18 0529  NA 140 140  K 4.8 4.3  CL 105 98  CO2 16* 28  GLUCOSE 148* 79  BUN 66* 32*  CREATININE 11.10* 7.46*  CALCIUM 8.5* 8.8*   Liver Function Tests:  Recent Labs  Lab 05/11/18 1014  AST 18  ALT 10  ALKPHOS 53  BILITOT 0.7  PROT 7.6  ALBUMIN 3.2*   No results for input(s): LIPASE, AMYLASE in the last 168 hours. No results for input(s): AMMONIA in the last 168 hours. CBC: Recent Labs  Lab 05/11/18 1014 05/12/18 0529  WBC 8.4 6.1  NEUTROABS 3.8  --   HGB 11.2* 11.8*  HCT 35.2* 34.2*  MCV 107.3* 101.8*  PLT 271 286   Cardiac Enzymes: No results for input(s): CKTOTAL, CKMB, CKMBINDEX, TROPONINI in the last 168 hours. BNP: Invalid input(s): POCBNP CBG: No results for input(s): GLUCAP in the last 168 hours. D-Dimer No results for input(s): DDIMER in the last 72 hours. Hgb A1c No results for input(s): HGBA1C in the last 72 hours. Lipid Profile No results for  input(s): CHOL, HDL, LDLCALC, TRIG, CHOLHDL, LDLDIRECT in the last 72 hours. Thyroid function studies No results for input(s): TSH, T4TOTAL, T3FREE, THYROIDAB in the last 72 hours.  Invalid input(s): FREET3 Anemia work up No results for input(s): VITAMINB12, FOLATE, FERRITIN, TIBC, IRON, RETICCTPCT in the last 72 hours. Urinalysis    Component Value Date/Time   COLORURINE YELLOW 05/12/2016 0834   APPEARANCEUR CLOUDY (A) 05/12/2016 0834   LABSPEC 1.014 05/12/2016 0834   PHURINE 5.0 05/12/2016 0834   GLUCOSEU NEGATIVE 05/12/2016 0834   HGBUR MODERATE (A) 05/12/2016 0834   BILIRUBINUR NEGATIVE 05/12/2016 0834   KETONESUR NEGATIVE 05/12/2016 0834   PROTEINUR 100 (A) 05/12/2016 0834   UROBILINOGEN 0.2 12/09/2013 0013   NITRITE NEGATIVE 05/12/2016 0834   LEUKOCYTESUR NEGATIVE 05/12/2016 0834   Sepsis Labs Invalid input(s): PROCALCITONIN,  WBC,  LACTICIDVEN Microbiology Recent Results (from the past 240 hour(s))  MRSA PCR Screening     Status: None   Collection Time: 05/11/18  7:08 PM  Result Value Ref Range Status   MRSA by PCR NEGATIVE NEGATIVE Final    Comment:        The GeneXpert MRSA Assay (FDA approved for NASAL specimens only), is one component of a comprehensive MRSA colonization surveillance program. It is not intended to diagnose MRSA infection nor to guide or monitor treatment for MRSA infections. Performed at [facility], [place] [address], [place],  [address]    Time spent: 45min  SIGNED:   Marylu Lund, MD  [facility] 05/12/2018, 1:53 PM  If 7PM-7AM, please contact night-coverage [email or web address] Password [email or web address]

## 2018-05-13 DIAGNOSIS — D509 Iron deficiency anemia, unspecified: Secondary | ICD-10-CM | POA: Diagnosis not present

## 2018-05-13 DIAGNOSIS — D631 Anemia in chronic kidney disease: Secondary | ICD-10-CM | POA: Diagnosis not present

## 2018-05-13 DIAGNOSIS — N2581 Secondary hyperparathyroidism of renal origin: Secondary | ICD-10-CM | POA: Diagnosis not present

## 2018-05-13 DIAGNOSIS — N186 End stage renal disease: Secondary | ICD-10-CM | POA: Diagnosis not present

## 2018-05-14 ENCOUNTER — Telehealth: Payer: Self-pay | Admitting: Family Medicine

## 2018-05-14 NOTE — Telephone Encounter (Signed)
Unable to reach patient at time of TCM Call. Left message for patient to return call when available.  

## 2018-05-15 DIAGNOSIS — N186 End stage renal disease: Secondary | ICD-10-CM | POA: Diagnosis not present

## 2018-05-15 DIAGNOSIS — N2581 Secondary hyperparathyroidism of renal origin: Secondary | ICD-10-CM | POA: Diagnosis not present

## 2018-05-15 DIAGNOSIS — D631 Anemia in chronic kidney disease: Secondary | ICD-10-CM | POA: Diagnosis not present

## 2018-05-15 DIAGNOSIS — D509 Iron deficiency anemia, unspecified: Secondary | ICD-10-CM | POA: Diagnosis not present

## 2018-05-17 NOTE — Telephone Encounter (Signed)
Unable to reach patient at time of TCM Call. Left message for patient to return call when available.  

## 2018-05-18 DIAGNOSIS — D509 Iron deficiency anemia, unspecified: Secondary | ICD-10-CM | POA: Diagnosis not present

## 2018-05-18 DIAGNOSIS — D631 Anemia in chronic kidney disease: Secondary | ICD-10-CM | POA: Diagnosis not present

## 2018-05-18 DIAGNOSIS — N2581 Secondary hyperparathyroidism of renal origin: Secondary | ICD-10-CM | POA: Diagnosis not present

## 2018-05-18 DIAGNOSIS — N186 End stage renal disease: Secondary | ICD-10-CM | POA: Diagnosis not present

## 2018-05-20 DIAGNOSIS — N2581 Secondary hyperparathyroidism of renal origin: Secondary | ICD-10-CM | POA: Diagnosis not present

## 2018-05-20 DIAGNOSIS — D509 Iron deficiency anemia, unspecified: Secondary | ICD-10-CM | POA: Diagnosis not present

## 2018-05-20 DIAGNOSIS — N186 End stage renal disease: Secondary | ICD-10-CM | POA: Diagnosis not present

## 2018-05-20 DIAGNOSIS — D631 Anemia in chronic kidney disease: Secondary | ICD-10-CM | POA: Diagnosis not present

## 2018-05-22 DIAGNOSIS — D631 Anemia in chronic kidney disease: Secondary | ICD-10-CM | POA: Diagnosis not present

## 2018-05-22 DIAGNOSIS — D509 Iron deficiency anemia, unspecified: Secondary | ICD-10-CM | POA: Diagnosis not present

## 2018-05-22 DIAGNOSIS — N186 End stage renal disease: Secondary | ICD-10-CM | POA: Diagnosis not present

## 2018-05-22 DIAGNOSIS — N2581 Secondary hyperparathyroidism of renal origin: Secondary | ICD-10-CM | POA: Diagnosis not present

## 2018-05-25 ENCOUNTER — Telehealth: Payer: Self-pay | Admitting: *Deleted

## 2018-05-25 DIAGNOSIS — N2581 Secondary hyperparathyroidism of renal origin: Secondary | ICD-10-CM | POA: Diagnosis not present

## 2018-05-25 DIAGNOSIS — N186 End stage renal disease: Secondary | ICD-10-CM | POA: Diagnosis not present

## 2018-05-25 DIAGNOSIS — D631 Anemia in chronic kidney disease: Secondary | ICD-10-CM | POA: Diagnosis not present

## 2018-05-25 DIAGNOSIS — D509 Iron deficiency anemia, unspecified: Secondary | ICD-10-CM | POA: Diagnosis not present

## 2018-05-25 NOTE — Telephone Encounter (Signed)
Called [name] and left message to return call.

## 2018-05-25 NOTE — Telephone Encounter (Signed)
Community message below received from [employer] w/ [facility]. We have not been able to get in touch w/ pt for TCM.  Per [name]'s message, appears pt/family either does not answer the phone or their phone is disconnected.   Can you advise on verbal orders for nursing/PT or do they need to wait for Dr. Volanda Napoleon to return?   --  Shamberger, Lorenza Chick, MD; Dorrene German, RN  Phone Number:  [phone] (Call me)        Can I have a home health order to provide nursing and PT... his daughter will not answer the phone only text messages or her cell phone has disconnected if you have tried calling.   thank you

## 2018-05-25 NOTE — Telephone Encounter (Signed)
OK to set up 

## 2018-05-26 NOTE — Telephone Encounter (Addendum)
Called [name]. Voicemail is full, unable to leave message.

## 2018-05-27 DIAGNOSIS — I129 Hypertensive chronic kidney disease with stage 1 through stage 4 chronic kidney disease, or unspecified chronic kidney disease: Secondary | ICD-10-CM | POA: Diagnosis not present

## 2018-05-27 DIAGNOSIS — N186 End stage renal disease: Secondary | ICD-10-CM | POA: Diagnosis not present

## 2018-05-27 DIAGNOSIS — Z992 Dependence on renal dialysis: Secondary | ICD-10-CM | POA: Diagnosis not present

## 2018-05-27 DIAGNOSIS — D509 Iron deficiency anemia, unspecified: Secondary | ICD-10-CM | POA: Diagnosis not present

## 2018-05-27 DIAGNOSIS — N2581 Secondary hyperparathyroidism of renal origin: Secondary | ICD-10-CM | POA: Diagnosis not present

## 2018-05-27 NOTE — Telephone Encounter (Signed)
CalledAdacia. Voicemail is full, unable to leave message.

## 2018-05-29 DIAGNOSIS — N2581 Secondary hyperparathyroidism of renal origin: Secondary | ICD-10-CM | POA: Diagnosis not present

## 2018-05-29 DIAGNOSIS — D509 Iron deficiency anemia, unspecified: Secondary | ICD-10-CM | POA: Diagnosis not present

## 2018-05-29 DIAGNOSIS — N186 End stage renal disease: Secondary | ICD-10-CM | POA: Diagnosis not present

## 2018-05-31 NOTE — Telephone Encounter (Signed)
CalledAdacia. Voicemail is full, unable to leave message.

## 2018-06-01 DIAGNOSIS — D509 Iron deficiency anemia, unspecified: Secondary | ICD-10-CM | POA: Diagnosis not present

## 2018-06-01 DIAGNOSIS — N186 End stage renal disease: Secondary | ICD-10-CM | POA: Diagnosis not present

## 2018-06-01 DIAGNOSIS — N2581 Secondary hyperparathyroidism of renal origin: Secondary | ICD-10-CM | POA: Diagnosis not present

## 2018-06-03 DIAGNOSIS — N2581 Secondary hyperparathyroidism of renal origin: Secondary | ICD-10-CM | POA: Diagnosis not present

## 2018-06-03 DIAGNOSIS — N186 End stage renal disease: Secondary | ICD-10-CM | POA: Diagnosis not present

## 2018-06-03 DIAGNOSIS — D509 Iron deficiency anemia, unspecified: Secondary | ICD-10-CM | POA: Diagnosis not present

## 2018-06-05 DIAGNOSIS — D509 Iron deficiency anemia, unspecified: Secondary | ICD-10-CM | POA: Diagnosis not present

## 2018-06-05 DIAGNOSIS — N186 End stage renal disease: Secondary | ICD-10-CM | POA: Diagnosis not present

## 2018-06-05 DIAGNOSIS — N2581 Secondary hyperparathyroidism of renal origin: Secondary | ICD-10-CM | POA: Diagnosis not present

## 2018-06-07 ENCOUNTER — Telehealth: Payer: Self-pay | Admitting: Family Medicine

## 2018-06-07 NOTE — Telephone Encounter (Signed)
Copied from [place] [id number]. Topic: Quick Communication - See Telephone Encounter >> Jun 07, 2018  3:32 PM Kenneth [name], [name] wrote: CRM for notification. See Telephone encounter for: 06/07/18. Kenneth [name] with [facility] home health is calling to request verbal orders as follow: PT 2X a week for 4 weeks starting today 06/07/18. Her call back number is [phone] and she said its ok to leave vm.

## 2018-06-07 NOTE — Telephone Encounter (Signed)
Please advise if ok to give verbal orders for Physical Therapy for the patient.

## 2018-06-08 DIAGNOSIS — N186 End stage renal disease: Secondary | ICD-10-CM | POA: Diagnosis not present

## 2018-06-08 DIAGNOSIS — D509 Iron deficiency anemia, unspecified: Secondary | ICD-10-CM | POA: Diagnosis not present

## 2018-06-08 DIAGNOSIS — N2581 Secondary hyperparathyroidism of renal origin: Secondary | ICD-10-CM | POA: Diagnosis not present

## 2018-06-09 NOTE — Telephone Encounter (Signed)
ok 

## 2018-06-10 DIAGNOSIS — N186 End stage renal disease: Secondary | ICD-10-CM | POA: Diagnosis not present

## 2018-06-10 DIAGNOSIS — N2581 Secondary hyperparathyroidism of renal origin: Secondary | ICD-10-CM | POA: Diagnosis not present

## 2018-06-10 DIAGNOSIS — D509 Iron deficiency anemia, unspecified: Secondary | ICD-10-CM | POA: Diagnosis not present

## 2018-06-12 DIAGNOSIS — D509 Iron deficiency anemia, unspecified: Secondary | ICD-10-CM | POA: Diagnosis not present

## 2018-06-12 DIAGNOSIS — N2581 Secondary hyperparathyroidism of renal origin: Secondary | ICD-10-CM | POA: Diagnosis not present

## 2018-06-12 DIAGNOSIS — N186 End stage renal disease: Secondary | ICD-10-CM | POA: Diagnosis not present

## 2018-06-15 DIAGNOSIS — N2581 Secondary hyperparathyroidism of renal origin: Secondary | ICD-10-CM | POA: Diagnosis not present

## 2018-06-15 DIAGNOSIS — N186 End stage renal disease: Secondary | ICD-10-CM | POA: Diagnosis not present

## 2018-06-15 DIAGNOSIS — D509 Iron deficiency anemia, unspecified: Secondary | ICD-10-CM | POA: Diagnosis not present

## 2018-06-17 DIAGNOSIS — N186 End stage renal disease: Secondary | ICD-10-CM | POA: Diagnosis not present

## 2018-06-17 DIAGNOSIS — D509 Iron deficiency anemia, unspecified: Secondary | ICD-10-CM | POA: Diagnosis not present

## 2018-06-17 DIAGNOSIS — N2581 Secondary hyperparathyroidism of renal origin: Secondary | ICD-10-CM | POA: Diagnosis not present

## 2018-06-18 NOTE — Telephone Encounter (Signed)
Oren Section with [facility] left a detailed message with approval on requested PT therapy orders as requested.

## 2018-06-19 DIAGNOSIS — N2581 Secondary hyperparathyroidism of renal origin: Secondary | ICD-10-CM | POA: Diagnosis not present

## 2018-06-19 DIAGNOSIS — N186 End stage renal disease: Secondary | ICD-10-CM | POA: Diagnosis not present

## 2018-06-19 DIAGNOSIS — D509 Iron deficiency anemia, unspecified: Secondary | ICD-10-CM | POA: Diagnosis not present

## 2018-06-22 DIAGNOSIS — D509 Iron deficiency anemia, unspecified: Secondary | ICD-10-CM | POA: Diagnosis not present

## 2018-06-22 DIAGNOSIS — N186 End stage renal disease: Secondary | ICD-10-CM | POA: Diagnosis not present

## 2018-06-22 DIAGNOSIS — N2581 Secondary hyperparathyroidism of renal origin: Secondary | ICD-10-CM | POA: Diagnosis not present

## 2018-06-22 NOTE — Telephone Encounter (Signed)
Spoke with Dan Europe with [facility], states that she received my voice message with the approval for verbal orders on pt

## 2018-06-24 DIAGNOSIS — N186 End stage renal disease: Secondary | ICD-10-CM | POA: Diagnosis not present

## 2018-06-24 DIAGNOSIS — D509 Iron deficiency anemia, unspecified: Secondary | ICD-10-CM | POA: Diagnosis not present

## 2018-06-24 DIAGNOSIS — N2581 Secondary hyperparathyroidism of renal origin: Secondary | ICD-10-CM | POA: Diagnosis not present

## 2018-06-26 DIAGNOSIS — N186 End stage renal disease: Secondary | ICD-10-CM | POA: Diagnosis not present

## 2018-06-26 DIAGNOSIS — N2581 Secondary hyperparathyroidism of renal origin: Secondary | ICD-10-CM | POA: Diagnosis not present

## 2018-06-26 DIAGNOSIS — D509 Iron deficiency anemia, unspecified: Secondary | ICD-10-CM | POA: Diagnosis not present

## 2018-06-27 DIAGNOSIS — I129 Hypertensive chronic kidney disease with stage 1 through stage 4 chronic kidney disease, or unspecified chronic kidney disease: Secondary | ICD-10-CM | POA: Diagnosis not present

## 2018-06-27 DIAGNOSIS — Z992 Dependence on renal dialysis: Secondary | ICD-10-CM | POA: Diagnosis not present

## 2018-06-27 DIAGNOSIS — N186 End stage renal disease: Secondary | ICD-10-CM | POA: Diagnosis not present

## 2018-06-29 DIAGNOSIS — D509 Iron deficiency anemia, unspecified: Secondary | ICD-10-CM | POA: Diagnosis not present

## 2018-06-29 DIAGNOSIS — N186 End stage renal disease: Secondary | ICD-10-CM | POA: Diagnosis not present

## 2018-06-29 DIAGNOSIS — N2581 Secondary hyperparathyroidism of renal origin: Secondary | ICD-10-CM | POA: Diagnosis not present

## 2018-06-30 ENCOUNTER — Ambulatory Visit: Payer: Self-pay | Admitting: Family Medicine

## 2018-06-30 ENCOUNTER — Telehealth: Payer: Self-pay | Admitting: Family Medicine

## 2018-06-30 NOTE — Telephone Encounter (Signed)
Monday BP - 180/90 Tuesday BP-100/80 Wednesday BP- 144/81                             166/81  weight is fluctuating 194 yesterday                                  206 today Patient does not have swelling, temp is normal,pulse normal, no signs of distress.   Patient may not be taking his medications consistently. Nurse is supposed to be calling in to request Social work help for him.  Due to the inconsistent BP and the variances in weight- call to office- appointment has been scheduled.  Gwyndolyn Saxon will relay information to family and review reasons to go to ED. Thanked him for his concern and call.  Reason for Disposition . Requesting regular office appointment  Answer Assessment - Initial Assessment Questions 1. REASON FOR CALL or QUESTION: "What is your reason for calling today?" or "How can I best help you?" or "What question do you have that I can help answer?"     Gwyndolyn Saxon- [facility] is calling to report concerning BP reading and weight changes with patient. He states he is not sure that patient is compliant with his medications. He reports no edema or breathing problems with patient. Call to office and [facility] agrees patient should be evaluated for changes. Appointment made.  Protocols used: INFORMATION ONLY CALL-A-AH

## 2018-06-30 NOTE — Telephone Encounter (Signed)
Copied from [place] [phone]. Topic: General - Other >> Jun 30, 2018 12:42 PM Alfredia Ferguson R wrote:  Pts PCP is Dr. Volanda Napoleon and his bp [place]chel [place] going up and down following weight ,  he was triaged yesterday with home health nurse and our nurse and a appt was scheduled for tomorrow at 1:30 pt has dialysis and cant come can he be scheduled with another provider since banks will be on vacation starting friday ?  Philippa Sicks Physical Therapist Assistant from [place]- [phone]

## 2018-06-30 NOTE — Telephone Encounter (Signed)
Pt should be scheduled this wk with this office or with Cardiology for concerns about bp/CHF.

## 2018-07-01 ENCOUNTER — Telehealth: Payer: Self-pay | Admitting: Family Medicine

## 2018-07-01 ENCOUNTER — Ambulatory Visit: Payer: Medicare Other | Admitting: Family Medicine

## 2018-07-01 DIAGNOSIS — N186 End stage renal disease: Secondary | ICD-10-CM | POA: Diagnosis not present

## 2018-07-01 DIAGNOSIS — D509 Iron deficiency anemia, unspecified: Secondary | ICD-10-CM | POA: Diagnosis not present

## 2018-07-01 DIAGNOSIS — Z0289 Encounter for other administrative examinations: Secondary | ICD-10-CM

## 2018-07-01 DIAGNOSIS — N2581 Secondary hyperparathyroidism of renal origin: Secondary | ICD-10-CM | POA: Diagnosis not present

## 2018-07-01 NOTE — Telephone Encounter (Signed)
Copied from [place] [phone]. Topic: General - Other >> Jul 01, 2018  2:20 PM Lennox Solders wrote: Reason for CRM: nicolette lpn [facility] is calling and needing verbal order for medical social worker to help with finance and medication management

## 2018-07-01 NOTE — Telephone Encounter (Signed)
Pt is on the scheduled for today with Dr. Volanda Napoleon, will leave note open until patient arrives at appointment.

## 2018-07-02 NOTE — Telephone Encounter (Signed)
Please Advise

## 2018-07-03 DIAGNOSIS — D509 Iron deficiency anemia, unspecified: Secondary | ICD-10-CM | POA: Diagnosis not present

## 2018-07-03 DIAGNOSIS — N2581 Secondary hyperparathyroidism of renal origin: Secondary | ICD-10-CM | POA: Diagnosis not present

## 2018-07-03 DIAGNOSIS — N186 End stage renal disease: Secondary | ICD-10-CM | POA: Diagnosis not present

## 2018-07-06 DIAGNOSIS — D509 Iron deficiency anemia, unspecified: Secondary | ICD-10-CM | POA: Diagnosis not present

## 2018-07-06 DIAGNOSIS — N2581 Secondary hyperparathyroidism of renal origin: Secondary | ICD-10-CM | POA: Diagnosis not present

## 2018-07-06 DIAGNOSIS — N186 End stage renal disease: Secondary | ICD-10-CM | POA: Diagnosis not present

## 2018-07-06 NOTE — Telephone Encounter (Signed)
ok 

## 2018-07-07 NOTE — Telephone Encounter (Signed)
Called Kenneth [name] from [facility] left a voicemail with detail message on approval for home health verbal orders per dr Volanda Napoleon

## 2018-07-08 DIAGNOSIS — D509 Iron deficiency anemia, unspecified: Secondary | ICD-10-CM | POA: Diagnosis not present

## 2018-07-08 DIAGNOSIS — N186 End stage renal disease: Secondary | ICD-10-CM | POA: Diagnosis not present

## 2018-07-08 DIAGNOSIS — N2581 Secondary hyperparathyroidism of renal origin: Secondary | ICD-10-CM | POA: Diagnosis not present

## 2018-07-10 DIAGNOSIS — N2581 Secondary hyperparathyroidism of renal origin: Secondary | ICD-10-CM | POA: Diagnosis not present

## 2018-07-10 DIAGNOSIS — D509 Iron deficiency anemia, unspecified: Secondary | ICD-10-CM | POA: Diagnosis not present

## 2018-07-10 DIAGNOSIS — N186 End stage renal disease: Secondary | ICD-10-CM | POA: Diagnosis not present

## 2018-07-13 DIAGNOSIS — N186 End stage renal disease: Secondary | ICD-10-CM | POA: Diagnosis not present

## 2018-07-13 DIAGNOSIS — N2581 Secondary hyperparathyroidism of renal origin: Secondary | ICD-10-CM | POA: Diagnosis not present

## 2018-07-13 DIAGNOSIS — D509 Iron deficiency anemia, unspecified: Secondary | ICD-10-CM | POA: Diagnosis not present

## 2018-07-15 DIAGNOSIS — D509 Iron deficiency anemia, unspecified: Secondary | ICD-10-CM | POA: Diagnosis not present

## 2018-07-15 DIAGNOSIS — N2581 Secondary hyperparathyroidism of renal origin: Secondary | ICD-10-CM | POA: Diagnosis not present

## 2018-07-15 DIAGNOSIS — N186 End stage renal disease: Secondary | ICD-10-CM | POA: Diagnosis not present

## 2018-07-17 DIAGNOSIS — D509 Iron deficiency anemia, unspecified: Secondary | ICD-10-CM | POA: Diagnosis not present

## 2018-07-17 DIAGNOSIS — N2581 Secondary hyperparathyroidism of renal origin: Secondary | ICD-10-CM | POA: Diagnosis not present

## 2018-07-17 DIAGNOSIS — N186 End stage renal disease: Secondary | ICD-10-CM | POA: Diagnosis not present

## 2018-07-20 ENCOUNTER — Encounter (HOSPITAL_COMMUNITY): Payer: Self-pay

## 2018-07-20 ENCOUNTER — Emergency Department (HOSPITAL_COMMUNITY): Payer: Medicare Other

## 2018-07-20 ENCOUNTER — Non-Acute Institutional Stay (HOSPITAL_COMMUNITY)
Admission: EM | Admit: 2018-07-20 | Discharge: 2018-07-21 | Disposition: A | Discharge status: Home / Self Care | Payer: Medicare Other | Attending: Emergency Medicine | Admitting: Emergency Medicine

## 2018-07-20 DIAGNOSIS — I472 Ventricular tachycardia: Secondary | ICD-10-CM | POA: Insufficient documentation

## 2018-07-20 DIAGNOSIS — R079 Chest pain, unspecified: Secondary | ICD-10-CM | POA: Diagnosis not present

## 2018-07-20 DIAGNOSIS — R52 Pain, unspecified: Secondary | ICD-10-CM | POA: Diagnosis not present

## 2018-07-20 DIAGNOSIS — I429 Cardiomyopathy, unspecified: Secondary | ICD-10-CM | POA: Insufficient documentation

## 2018-07-20 DIAGNOSIS — N186 End stage renal disease: Secondary | ICD-10-CM

## 2018-07-20 DIAGNOSIS — R0789 Other chest pain: Secondary | ICD-10-CM | POA: Diagnosis not present

## 2018-07-20 DIAGNOSIS — Z992 Dependence on renal dialysis: Secondary | ICD-10-CM | POA: Diagnosis not present

## 2018-07-20 DIAGNOSIS — I700 Atherosclerosis of aorta: Secondary | ICD-10-CM | POA: Diagnosis not present

## 2018-07-20 DIAGNOSIS — I5042 Chronic combined systolic (congestive) and diastolic (congestive) heart failure: Secondary | ICD-10-CM | POA: Insufficient documentation

## 2018-07-20 DIAGNOSIS — Z87891 Personal history of nicotine dependence: Secondary | ICD-10-CM | POA: Insufficient documentation

## 2018-07-20 DIAGNOSIS — I132 Hypertensive heart and chronic kidney disease with heart failure and with stage 5 chronic kidney disease, or end stage renal disease: Secondary | ICD-10-CM | POA: Insufficient documentation

## 2018-07-20 DIAGNOSIS — I4891 Unspecified atrial fibrillation: Secondary | ICD-10-CM | POA: Insufficient documentation

## 2018-07-20 DIAGNOSIS — J810 Acute pulmonary edema: Secondary | ICD-10-CM | POA: Diagnosis not present

## 2018-07-20 DIAGNOSIS — J80 Acute respiratory distress syndrome: Secondary | ICD-10-CM | POA: Diagnosis not present

## 2018-07-20 DIAGNOSIS — R0902 Hypoxemia: Secondary | ICD-10-CM | POA: Diagnosis not present

## 2018-07-20 DIAGNOSIS — R0602 Shortness of breath: Secondary | ICD-10-CM | POA: Diagnosis not present

## 2018-07-20 LAB — CBC WITH DIFFERENTIAL/PLATELET
Abs Immature Granulocytes: 0 10*3/uL (ref 0.0–0.1)
Basophils Absolute: 0.1 10*3/uL (ref 0.0–0.1)
Basophils Relative: 1 %
Eosinophils Absolute: 0.9 10*3/uL — ABNORMAL HIGH (ref 0.0–0.7)
Eosinophils Relative: 11 %
HEMATOCRIT: 31.7 % — AB (ref 39.0–52.0)
HEMOGLOBIN: 10.4 g/dL — AB (ref 13.0–17.0)
IMMATURE GRANULOCYTES: 0 %
LYMPHS ABS: 1.3 10*3/uL (ref 0.7–4.0)
LYMPHS PCT: 16 %
MCH: 32.4 pg (ref 26.0–34.0)
MCHC: 32.8 g/dL (ref 30.0–36.0)
MCV: 98.8 fL (ref 78.0–100.0)
Monocytes Absolute: 0.9 10*3/uL (ref 0.1–1.0)
Monocytes Relative: 11 %
NEUTROS PCT: 61 %
Neutro Abs: 4.9 10*3/uL (ref 1.7–7.7)
Platelets: 195 10*3/uL (ref 150–400)
RBC: 3.21 MIL/uL — AB (ref 4.22–5.81)
RDW: 13.3 % (ref 11.5–15.5)
WBC: 8.1 10*3/uL (ref 4.0–10.5)

## 2018-07-20 LAB — I-STAT TROPONIN, ED: Troponin i, poc: 0.04 ng/mL (ref 0.00–0.08)

## 2018-07-20 LAB — COMPREHENSIVE METABOLIC PANEL
ALK PHOS: 64 U/L (ref 38–126)
ALT: 9 U/L (ref 0–44)
AST: 12 U/L — AB (ref 15–41)
Albumin: 3.3 g/dL — ABNORMAL LOW (ref 3.5–5.0)
Anion gap: 20 — ABNORMAL HIGH (ref 5–15)
BUN: 96 mg/dL — AB (ref 8–23)
CALCIUM: 7.5 mg/dL — AB (ref 8.9–10.3)
CHLORIDE: 96 mmol/L — AB (ref 98–111)
CO2: 22 mmol/L (ref 22–32)
CREATININE: 13.83 mg/dL — AB (ref 0.61–1.24)
GFR, EST AFRICAN AMERICAN: 4 mL/min — AB (ref >60)
GFR, EST NON AFRICAN AMERICAN: 3 mL/min — AB (ref >60)
Glucose, Bld: 100 mg/dL — ABNORMAL HIGH (ref 70–99)
Potassium: 5.1 mmol/L (ref 3.5–5.1)
Sodium: 138 mmol/L (ref 135–145)
Total Bilirubin: 0.9 mg/dL (ref 0.3–1.2)
Total Protein: 7.3 g/dL (ref 6.5–8.1)

## 2018-07-20 LAB — I-STAT VENOUS BLOOD GAS, ED
Acid-base deficit: 3 mmol/L — ABNORMAL HIGH (ref 0.0–2.0)
BICARBONATE: 22.9 mmol/L (ref 20.0–28.0)
O2 Saturation: 72 %
PCO2 VEN: 43.3 mmHg — AB (ref 44.0–60.0)
PH VEN: 7.332 (ref 7.250–7.430)
PO2 VEN: 41 mmHg (ref 32.0–45.0)
TCO2: 24 mmol/L (ref 22–32)

## 2018-07-20 MED ORDER — NITROGLYCERIN 0.4 MG SL SUBL: 0.4000 mg | SUBLINGUAL_TABLET | SUBLINGUAL | Status: DC | PRN

## 2018-07-20 MED ORDER — ALBUTEROL SULFATE (2.5 MG/3ML) 0.083% IN NEBU
5.0000 mg | INHALATION_SOLUTION | Freq: Once | RESPIRATORY_TRACT | Status: AC
Start: 2018-07-20 — End: 2018-07-20
  Administered 2018-07-20: 5 mg via RESPIRATORY_TRACT
  Filled 2018-07-20: qty 6

## 2018-07-20 NOTE — Progress Notes (Signed)
[facility] Brief Note   Kenneth [name] is 62 year old male with ESRD on HD TTS at [facility]. Presented to ED this am with SOB. Has been compliant with HD, but left 6.4kg over his dry weight on Saturday.  Initially requiring BiPAP now  Weaned to 2L nasal oxygen. CXR shows mild interstitial edema.   -Will write orders for HD today in hospital  - UF goal 5L  -For discharge after HD    Lynnda Child PA-C [place] Pager [phone] 07/20/2018,10:39 AM

## 2018-07-20 NOTE — ED Provider Notes (Signed)
MSE was initiated and I personally evaluated the patient and placed orders (if any) at  6:36 AM on July 20, 2018.  The patient appears stable so that the remainder of the MSE may be completed by another provider.  Patient has end-stage renal disease and gets dialysis on Tuesday (today), Thursdays, and Saturdays.  He has not missed any recent dialysis.  He has been getting short of breath over the past 3 days but got acutely worse this morning and he also developed some central chest pain that did not radiate.  He has a history of congestive heart failure.  EMS stated he had diminished breath sounds and his pulse ox was in the low 90s.  They put him on BiPAP and he appeared to do better.  They also gave him 324 mg aspirin and one nitroglycerin and his chest pain has resolved.   Patient is on BiPAP which limits the exam somewhat.  He indicates however he is feeling better.  On lung exam he has very diminished breath sounds and diffuse expiratory rhonchi.  Initial x-ray studies and laboratory testing was ordered.   EKG Interpretation  Date/Time:    Ventricular Rate:  83 PR Interval:    QRS Duration: 93 QT Interval:  412 QTC Calculation: 485 R Axis:   -22 Text Interpretation:  Sinus rhythm Atrial premature complex Probable left ventricular hypertrophy Anterior Q waves, possibly due to LVH Confirmed by Rolland Porter [phone]) on 07/20/2018 6:46:39 AM         Rolland Porter, MD, Sedonia Small, Daleen Bo, MD 07/20/18 [phone]

## 2018-07-20 NOTE — ED Triage Notes (Signed)
Patient arrived via EMS with c/o SHOB x 3 days with CPAP applied; pt had acute onset of sharp non radiating chest pain this am; Patient is due for dialysis today but spouse called EMS for [facility]; Pt c/o 0/10 pain on arrival; Pt is a&ox 4 on arrival. Respiratory present on arrival; pt will be transferred off CPAP given breathing tx; Patient has Rhonchi breath sounds on arrival; pt had 324 ASA and 0.4 Nitro en route to ED; patient from home with spouse;-Monique,RN

## 2018-07-20 NOTE — ED Notes (Signed)
ED Provider at bedside. 

## 2018-07-20 NOTE — Progress Notes (Signed)
Pt removed from BIPAP to give Neb tx patient WOB seems to be ok and SP02 is good.  Patient dont require BIPAP at this time will continue to access after tx and after ABG obtained.

## 2018-07-20 NOTE — ED Provider Notes (Signed)
Emergency Department Provider Note   I have reviewed the triage vital signs and the nursing notes.   HISTORY  Chief Complaint Shortness of Breath and Chest Pain   HPI Kenneth [name] is a 62 y.o. male with history of CHF, end-stage renal disease on dialysis and alcohol abuse with hypertension the presents the emergency room today with chest pain shortness of breath.  Patient states that he has been having intermittent chest pain for approximately 20 minutes at a time is not associated with anything ever since Sunday.  This morning he started having worsening shortness of breath so his wife called EMS and sent him here.  On EMS arrival apparently the patient's oxygen was low 90s and he was working hard to breathe so I started him on BiPAP and brought him here for further evaluation.  Medical screening exam as per previous provider note however this time patient is on nasal cannula oxygen and is not having more shortness of breath or chest pain.  He states he is due for dialysis today he has not missed any in the past.  No history of coronary artery disease that he knows of. No other associated or modifying symptoms.    Past Medical History:  Diagnosis Date  . Acute CHF ([place]) 01/2018  . Cardiomyopathy secondary    likely related to HTN heart disease; possibly ETOH related as well  . Chronic combined systolic and diastolic heart failure (HCC)    Echocardiogram 09/22/11: Moderate LVH, EF 16-10%, grade 3 diastolic dysfunction, mild MR, moderate to severe LAE, mild RVE, mild to moderate TR, small to moderate pericardial effusion  . ESRD (end stage renal disease) on dialysis [facility])    due to hypertensive nephrosclerosis  . History of alcohol abuse   . Hypertension     Patient Active Problem List   Diagnosis Date Noted  . Acute respiratory failure ([place]) 05/11/2018  . Cardiac arrest ([place]) 03/09/2018  . Acute encephalopathy   . Acute on chronic respiratory failure ([place]) 02/16/2018  .  Tobacco dependence 02/16/2018  . Pulmonary edema 01/26/2018  . Acute respiratory failure with hypoxia ([place]) 01/26/2018  . Atrial fibrillation and flutter ([place])   . Shortness of breath   . Acute on chronic diastolic CHF (congestive heart failure) ([place])   . Hypothermia 10/08/2016  . ESRD on hemodialysis ([place]) 10/08/2016  . Fall 10/08/2016  . Syncope 10/07/2016  . Near syncope 07/07/2016  . Cardiomyopathy ([place]) 07/07/2016  . Ventricular tachycardia, non-sustained ([place]) 07/06/2016  . Hypoglycemia 07/06/2016  . Alcohol abuse 05/13/2016  . Hypertensive emergency 12/08/2013  . Cardiomyopathy secondary 10/09/2011  . Essential hypertension 10/09/2011  . Alcohol withdrawal delirium ([place]) 09/25/2011  . Chronic combined systolic and diastolic CHF (congestive heart failure) ([place]) 09/24/2011    Past Surgical History:  Procedure Laterality Date  . AV FISTULA PLACEMENT Left 05/14/2016   Procedure: LEFT ARM BASILIC VEIN TRANSPOSITION;  Surgeon: Rosetta Posner, MD;  Location: [facility];  Service: Vascular;  Laterality: Left;  . PERIPHERAL VASCULAR CATHETERIZATION N/A 05/13/2016   Procedure: Dialysis/Perma Catheter Insertion;  Surgeon: Serafina Mitchell, MD;  Location: [facility] CV [facility];  Service: Cardiovascular;  Laterality: N/A;    Current Outpatient Rx  . Order #: [id number] Class: Historical Med  . Order #: [id number] Class: Historical Med  . Order #: [id number] Class: Historical Med  . Order #: [id number] Class: Normal  . Order #: [id number] Class: Normal  . Order #: [id number] Class: Historical Med  . Order #: [id number] Class: Print  . Order #:  [id number] Class: Normal    Allergies Patient has no known allergies.  Family History  Problem Relation Age of Onset  . Emphysema Mother   . Cirrhosis Father     Social History Social History   Tobacco Use  . Smoking status: Former Smoker    Types: Cigars    Last attempt to quit: 10/27/2017    Years since quitting: 0.7  . Smokeless tobacco: Former Dance movement psychotherapist Use Topics  . Alcohol use: Yes    Alcohol/week: 30.0 standard drinks    Types: 30 Cans of beer per week    Frequency: Never    Comment: drinks daily  . Drug use: No    Review of Systems  All other systems negative except as documented in the HPI. All pertinent positives and negatives as reviewed in the HPI. ____________________________________________   PHYSICAL EXAM:  VITAL SIGNS: ED Triage Vitals  Enc Vitals Group     BP 07/20/18 0645 (!) 175/102     Pulse Rate 07/20/18 0645 80     Resp 07/20/18 0645 15     Temp 07/20/18 0645 98.1 F (36.7 C)     Temp Source 07/20/18 0645 Oral     SpO2 07/20/18 0636 100 %     Weight 07/20/18 0639 194 lb (88 kg)     Height 07/20/18 0639 5\' 10"  (1.778 m)    Constitutional: Alert and oriented. Well appearing and in no acute distress. Eyes: Conjunctivae are normal. PERRL. EOMI. Head: Atraumatic. Nose: No congestion/rhinnorhea. Mouth/Throat: Mucous membranes are moist.  Oropharynx non-erythematous. Neck: No stridor.  No meningeal signs.  JVD present. Cardiovascular: Distant heart sounds. Tachycardic, regular rhythm. Good peripheral circulation. Grossly normal heart sounds.   Respiratory: tachypneic respiratory effort.  No retractions. Lungs diminished on left, crackles in base on right. Gastrointestinal: Soft and nontender. No distention.  Musculoskeletal: No lower extremity tenderness nor edema. No gross deformities of extremities. Neurologic:  Normal speech and language. No gross focal neurologic deficits are appreciated.  Skin:  Skin is warm, dry and intact. No rash noted.   ____________________________________________   LABS (all labs ordered are listed, but only abnormal results are displayed)  Labs Reviewed  COMPREHENSIVE METABOLIC PANEL - Abnormal; Notable for the following components:      Result Value   Chloride 96 (*)    Glucose, Bld 100 (*)    BUN 96 (*)    Creatinine, Ser 13.83 (*)    Calcium 7.5 (*)     Albumin 3.3 (*)    AST 12 (*)    GFR calc non Af Amer 3 (*)    GFR calc Af Amer 4 (*)    Anion gap 20 (*)    All other components within normal limits  CBC WITH DIFFERENTIAL/PLATELET - Abnormal; Notable for the following components:   RBC 3.21 (*)    Hemoglobin 10.4 (*)    HCT 31.7 (*)    Eosinophils Absolute 0.9 (*)    All other components within normal limits  I-STAT VENOUS BLOOD GAS, ED - Abnormal; Notable for the following components:   pCO2, Ven 43.3 (*)    Acid-base deficit 3.0 (*)    All other components within normal limits  I-STAT TROPONIN, ED   ____________________________________________  EKG   EKG Interpretation  Date/Time:  Tuesday July 20 2018 06:41:31 EDT Ventricular Rate:  83 PR Interval:    QRS Duration: 93 QT Interval:  412 QTC Calculation: 485 R Axis:   -22 Text Interpretation:  Sinus rhythm Atrial premature complex Probable left ventricular hypertrophy Anterior Q waves, possibly due to LVH Confirmed by Rolland Porter [phone]) on 07/20/2018 6:46:39 AM       ____________________________________________  RADIOLOGY  Dg Chest Port 1 View  Result Date: 07/20/2018 CLINICAL DATA:  Three days of shortness of breath, shortness of breath this morning. Patient is due for dialysis today. EXAM: PORTABLE CHEST 1 VIEW COMPARISON:  Portable chest x-ray of May 11, 2018 FINDINGS: The lungs are well-expanded. There is no focal infiltrate. The interstitial markings are coarse. The cardiac silhouette is mildly enlarged but stable. The pulmonary vascularity is not clearly engorged. The trachea is midline. There is calcification in the wall of the thoracic aorta. The bony thorax exhibits no acute abnormality. IMPRESSION: Mild interstitial edema bilaterally. No pleural effusion or alveolar pneumonia. Thoracic aortic atherosclerosis. Electronically Signed   By: David  [name] M.D.   On: 07/20/2018 07:18     ____________________________________________   PROCEDURES  Procedure(s) performed:   Procedures  CRITICAL CARE Performed by: Merrily Pew Total critical care time: 35 minutes Critical care time was exclusive of separately billable procedures and treating other patients. Critical care was necessary to treat or prevent imminent or life-threatening deterioration. Critical care was time spent personally by me on the following activities: development of treatment plan with patient and/or surrogate as well as nursing, discussions with consultants, evaluation of patient's response to treatment, examination of patient, obtaining history from patient or surrogate, ordering and performing treatments and interventions, ordering and review of laboratory studies, ordering and review of radiographic studies, pulse oximetry and re-evaluation of patient's condition.  ____________________________________________   INITIAL IMPRESSION / ASSESSMENT AND PLAN / ED COURSE  Workup negative.  Symptoms seem consistent with fluid overload.  Will trial him off his oxygen.  He is symptom-free at this time.  He is hypertensive but I suspect that this could all resolved with dialysis.  He has an appointment for 1115 today on [employer].  We will observe him for an hour or 2 off of oxygen if he seems to be stable we will discharge him to get outpatient dialysis however if he is requiring oxygen or worsening respiratory distress we will arrange for dialysis here.  Patient was tried to wean off of his oxygen for proximal 45 minutes to get more tachypneic, hypoxic and recurrence of his chest pain.  Start back on his oxygen the symptoms improved.  His work-up is consistent with fluid overload.  Discussed with nephrology who will dialyze and discharge.   Pertinent labs & imaging results that were available during my care of the patient were reviewed by me and considered in my medical decision making (see chart for  details).  ____________________________________________  FINAL CLINICAL IMPRESSION(S) / ED DIAGNOSES  Final diagnoses:  Acute pulmonary edema ([place])     MEDICATIONS GIVEN DURING THIS VISIT:  Medications  albuterol (PROVENTIL) (2.5 MG/3ML) 0.083% nebulizer solution 5 mg (5 mg Nebulization Given by Other 07/20/18 0654)     NEW OUTPATIENT MEDICATIONS STARTED DURING THIS VISIT:  New Prescriptions   No medications on file    Note:  This note was prepared with assistance of Dragon voice recognition software. Occasional wrong-word or sound-a-like substitutions may have occurred due to the inherent limitations of voice recognition software.   Merrily Pew, MD 07/20/18 [phone]

## 2018-07-20 NOTE — Procedures (Signed)
Patient was seen on dialysis and the procedure was supervised.  BFR 400  Via AVF BP is  174/70, 02 sat 98 % in 2 L.   Patient appears to be tolerating treatment well. UF goal 5 L. Wean oxygen to room air and possibly discharge home today.  Kenneth [name] 07/20/2018

## 2018-07-22 DIAGNOSIS — D509 Iron deficiency anemia, unspecified: Secondary | ICD-10-CM | POA: Diagnosis not present

## 2018-07-22 DIAGNOSIS — N186 End stage renal disease: Secondary | ICD-10-CM | POA: Diagnosis not present

## 2018-07-22 DIAGNOSIS — N2581 Secondary hyperparathyroidism of renal origin: Secondary | ICD-10-CM | POA: Diagnosis not present

## 2018-07-24 DIAGNOSIS — D509 Iron deficiency anemia, unspecified: Secondary | ICD-10-CM | POA: Diagnosis not present

## 2018-07-24 DIAGNOSIS — N2581 Secondary hyperparathyroidism of renal origin: Secondary | ICD-10-CM | POA: Diagnosis not present

## 2018-07-24 DIAGNOSIS — N186 End stage renal disease: Secondary | ICD-10-CM | POA: Diagnosis not present

## 2018-07-27 DIAGNOSIS — Z992 Dependence on renal dialysis: Secondary | ICD-10-CM | POA: Diagnosis not present

## 2018-07-27 DIAGNOSIS — D509 Iron deficiency anemia, unspecified: Secondary | ICD-10-CM | POA: Diagnosis not present

## 2018-07-27 DIAGNOSIS — I129 Hypertensive chronic kidney disease with stage 1 through stage 4 chronic kidney disease, or unspecified chronic kidney disease: Secondary | ICD-10-CM | POA: Diagnosis not present

## 2018-07-27 DIAGNOSIS — N2581 Secondary hyperparathyroidism of renal origin: Secondary | ICD-10-CM | POA: Diagnosis not present

## 2018-07-27 DIAGNOSIS — Z23 Encounter for immunization: Secondary | ICD-10-CM | POA: Diagnosis not present

## 2018-07-27 DIAGNOSIS — N186 End stage renal disease: Secondary | ICD-10-CM | POA: Diagnosis not present

## 2018-07-29 DIAGNOSIS — Z23 Encounter for immunization: Secondary | ICD-10-CM | POA: Diagnosis not present

## 2018-07-29 DIAGNOSIS — N2581 Secondary hyperparathyroidism of renal origin: Secondary | ICD-10-CM | POA: Diagnosis not present

## 2018-07-29 DIAGNOSIS — D509 Iron deficiency anemia, unspecified: Secondary | ICD-10-CM | POA: Diagnosis not present

## 2018-07-29 DIAGNOSIS — N186 End stage renal disease: Secondary | ICD-10-CM | POA: Diagnosis not present

## 2018-07-31 DIAGNOSIS — N2581 Secondary hyperparathyroidism of renal origin: Secondary | ICD-10-CM | POA: Diagnosis not present

## 2018-07-31 DIAGNOSIS — D509 Iron deficiency anemia, unspecified: Secondary | ICD-10-CM | POA: Diagnosis not present

## 2018-07-31 DIAGNOSIS — N186 End stage renal disease: Secondary | ICD-10-CM | POA: Diagnosis not present

## 2018-07-31 DIAGNOSIS — Z23 Encounter for immunization: Secondary | ICD-10-CM | POA: Diagnosis not present

## 2018-08-03 DIAGNOSIS — N2581 Secondary hyperparathyroidism of renal origin: Secondary | ICD-10-CM | POA: Diagnosis not present

## 2018-08-03 DIAGNOSIS — N186 End stage renal disease: Secondary | ICD-10-CM | POA: Diagnosis not present

## 2018-08-03 DIAGNOSIS — D509 Iron deficiency anemia, unspecified: Secondary | ICD-10-CM | POA: Diagnosis not present

## 2018-08-03 DIAGNOSIS — Z23 Encounter for immunization: Secondary | ICD-10-CM | POA: Diagnosis not present

## 2018-08-05 ENCOUNTER — Other Ambulatory Visit
Admit: 2018-08-05 | Discharge: 2018-08-05 | Disposition: A | Discharge status: Home / Self Care | Payer: No Typology Code available for payment source

## 2018-08-05 ENCOUNTER — Other Ambulatory Visit (HOSPITAL_BASED_OUTPATIENT_CLINIC_OR_DEPARTMENT_OTHER): Payer: Self-pay | Admitting: Nephrology

## 2018-08-05 DIAGNOSIS — N269 Renal sclerosis, unspecified: Secondary | ICD-10-CM

## 2018-08-05 DIAGNOSIS — D509 Iron deficiency anemia, unspecified: Secondary | ICD-10-CM | POA: Diagnosis not present

## 2018-08-05 DIAGNOSIS — N2581 Secondary hyperparathyroidism of renal origin: Secondary | ICD-10-CM | POA: Diagnosis not present

## 2018-08-05 DIAGNOSIS — N186 End stage renal disease: Secondary | ICD-10-CM | POA: Diagnosis not present

## 2018-08-05 DIAGNOSIS — Z23 Encounter for immunization: Secondary | ICD-10-CM | POA: Diagnosis not present

## 2018-08-07 DIAGNOSIS — Z23 Encounter for immunization: Secondary | ICD-10-CM | POA: Diagnosis not present

## 2018-08-07 DIAGNOSIS — D509 Iron deficiency anemia, unspecified: Secondary | ICD-10-CM | POA: Diagnosis not present

## 2018-08-07 DIAGNOSIS — N2581 Secondary hyperparathyroidism of renal origin: Secondary | ICD-10-CM | POA: Diagnosis not present

## 2018-08-07 DIAGNOSIS — N186 End stage renal disease: Secondary | ICD-10-CM | POA: Diagnosis not present

## 2018-08-10 ENCOUNTER — Emergency Department (HOSPITAL_COMMUNITY)
Admission: EM | Admit: 2018-08-10 | Discharge: 2018-08-10 | Disposition: A | Discharge status: Home / Self Care | Payer: Medicare Other | Attending: Emergency Medicine | Admitting: Emergency Medicine

## 2018-08-10 ENCOUNTER — Encounter (HOSPITAL_COMMUNITY): Payer: Self-pay | Admitting: Emergency Medicine

## 2018-08-10 ENCOUNTER — Emergency Department (HOSPITAL_COMMUNITY): Payer: Medicare Other

## 2018-08-10 DIAGNOSIS — I10 Essential (primary) hypertension: Secondary | ICD-10-CM | POA: Diagnosis not present

## 2018-08-10 DIAGNOSIS — Z992 Dependence on renal dialysis: Secondary | ICD-10-CM | POA: Insufficient documentation

## 2018-08-10 DIAGNOSIS — Z7982 Long term (current) use of aspirin: Secondary | ICD-10-CM | POA: Diagnosis not present

## 2018-08-10 DIAGNOSIS — N186 End stage renal disease: Secondary | ICD-10-CM | POA: Diagnosis not present

## 2018-08-10 DIAGNOSIS — R0789 Other chest pain: Secondary | ICD-10-CM | POA: Diagnosis not present

## 2018-08-10 DIAGNOSIS — I132 Hypertensive heart and chronic kidney disease with heart failure and with stage 5 chronic kidney disease, or end stage renal disease: Secondary | ICD-10-CM | POA: Insufficient documentation

## 2018-08-10 DIAGNOSIS — R079 Chest pain, unspecified: Secondary | ICD-10-CM | POA: Diagnosis not present

## 2018-08-10 DIAGNOSIS — Z87891 Personal history of nicotine dependence: Secondary | ICD-10-CM | POA: Diagnosis not present

## 2018-08-10 DIAGNOSIS — R0602 Shortness of breath: Secondary | ICD-10-CM | POA: Diagnosis not present

## 2018-08-10 DIAGNOSIS — I5042 Chronic combined systolic (congestive) and diastolic (congestive) heart failure: Secondary | ICD-10-CM | POA: Diagnosis not present

## 2018-08-10 DIAGNOSIS — Z79899 Other long term (current) drug therapy: Secondary | ICD-10-CM | POA: Diagnosis not present

## 2018-08-10 LAB — BASIC METABOLIC PANEL
Anion gap: 21 — ABNORMAL HIGH (ref 5–15)
BUN: 78 mg/dL — AB (ref 8–23)
CHLORIDE: 93 mmol/L — AB (ref 98–111)
CO2: 24 mmol/L (ref 22–32)
Calcium: 8.2 mg/dL — ABNORMAL LOW (ref 8.9–10.3)
Creatinine, Ser: 12.9 mg/dL — ABNORMAL HIGH (ref 0.61–1.24)
GFR calc Af Amer: 4 mL/min — ABNORMAL LOW (ref >60)
GFR calc non Af Amer: 4 mL/min — ABNORMAL LOW (ref >60)
GLUCOSE: 93 mg/dL (ref 70–99)
POTASSIUM: 4.5 mmol/L (ref 3.5–5.1)
Sodium: 138 mmol/L (ref 135–145)

## 2018-08-10 LAB — CBC
HEMATOCRIT: 33.7 % — AB (ref 39.0–52.0)
Hemoglobin: 11.2 g/dL — ABNORMAL LOW (ref 13.0–17.0)
MCH: 32.2 pg (ref 26.0–34.0)
MCHC: 33.2 g/dL (ref 30.0–36.0)
MCV: 96.8 fL (ref 80.0–100.0)
NRBC: 0 % (ref 0.0–0.2)
Platelets: 204 10*3/uL (ref 150–400)
RBC: 3.48 MIL/uL — ABNORMAL LOW (ref 4.22–5.81)
RDW: 13.4 % (ref 11.5–15.5)
WBC: 6.8 10*3/uL (ref 4.0–10.5)

## 2018-08-10 LAB — I-STAT TROPONIN, ED: Troponin i, poc: 0.02 ng/mL (ref 0.00–0.08)

## 2018-08-10 LAB — BRAIN NATRIURETIC PEPTIDE: B Natriuretic Peptide: 678.7 pg/mL — ABNORMAL HIGH (ref 0.0–100.0)

## 2018-08-10 NOTE — ED Provider Notes (Signed)
[facility] EMERGENCY DEPARTMENT Provider Note   CSN: [id number] Arrival date & time: 08/10/18  0055     History   Chief Complaint Chief Complaint  Patient presents with  . Chest Pain    HPI Kenneth [name] is a 62 y.o. male.  Patient with past medical history of CHF, ESRD, on HD (Tuesday, Thursday, Saturday), last dialysis was on Saturday, presents to the emergency department with a chief complaint of chest pain.  He denies any associated shortness of breath.  Denies any lower extremity swelling.  He states that he feels like this when he has too much fluid.  Denies fever, chills, cough.  Denies any other associated symptoms.  He was given aspirin and nitroglycerin in the triage area and had some improvement of his chest pain.  Currently rates it 3 out of 10.  The history is provided by the patient. No language interpreter was used.    Past Medical History:  Diagnosis Date  . Acute CHF ([place]) 01/2018  . Cardiomyopathy secondary    likely related to HTN heart disease; possibly ETOH related as well  . Chronic combined systolic and diastolic heart failure (HCC)    Echocardiogram 09/22/11: Moderate LVH, EF 10-93%, grade 3 diastolic dysfunction, mild MR, moderate to severe LAE, mild RVE, mild to moderate TR, small to moderate pericardial effusion  . ESRD (end stage renal disease) on dialysis [facility])    due to hypertensive nephrosclerosis  . History of alcohol abuse   . Hypertension     Patient Active Problem List   Diagnosis Date Noted  . ESRD (end stage renal disease) on dialysis ([place]) 07/20/2018  . Acute respiratory failure ([place]) 05/11/2018  . Cardiac arrest ([place]) 03/09/2018  . Acute encephalopathy   . Acute on chronic respiratory failure ([place]) 02/16/2018  . Tobacco dependence 02/16/2018  . Pulmonary edema 01/26/2018  . Acute respiratory failure with hypoxia ([place]) 01/26/2018  . Atrial fibrillation and flutter ([place])   . Shortness of breath   . Acute on  chronic diastolic CHF (congestive heart failure) ([place])   . Hypothermia 10/08/2016  . ESRD on hemodialysis ([place]) 10/08/2016  . Fall 10/08/2016  . Syncope 10/07/2016  . Near syncope 07/07/2016  . Cardiomyopathy ([place]) 07/07/2016  . Ventricular tachycardia, non-sustained ([place]) 07/06/2016  . Hypoglycemia 07/06/2016  . Alcohol abuse 05/13/2016  . Hypertensive emergency 12/08/2013  . Cardiomyopathy secondary 10/09/2011  . Essential hypertension 10/09/2011  . Alcohol withdrawal delirium ([place]) 09/25/2011  . Chronic combined systolic and diastolic CHF (congestive heart failure) ([place]) 09/24/2011    Past Surgical History:  Procedure Laterality Date  . AV FISTULA PLACEMENT Left 05/14/2016   Procedure: LEFT ARM BASILIC VEIN TRANSPOSITION;  Surgeon: Rosetta Posner, MD;  Location: [facility];  Service: Vascular;  Laterality: Left;  . PERIPHERAL VASCULAR CATHETERIZATION N/A 05/13/2016   Procedure: Dialysis/Perma Catheter Insertion;  Surgeon: Serafina Mitchell, MD;  Location: [facility];  Service: Cardiovascular;  Laterality: N/A;        Home Medications    Prior to Admission medications   Medication Sig Start Date End Date Taking? Authorizing Provider  amLODipine (NORVASC) 10 MG tablet Take 10 mg by mouth at bedtime.    [provider]  aspirin EC 81 MG tablet Take 81 mg by mouth daily.    [provider]  diphenhydramine-acetaminophen (TYLENOL PM) 25-500 MG TABS tablet Take 1 tablet by mouth at bedtime as needed (for sleep).    [provider]  divalproex (  DEPAKOTE) 500 MG DR tablet Take 1 tablet (500 mg total) by mouth 2 (two) times daily. 03/27/18 05/11/18  Dessa Phi, DO  folic acid (FOLVITE) 1 MG tablet Take 1 tablet (1 mg total) by mouth daily. Patient not taking: Reported on 05/11/2018 01/28/18   Cristal Ford, DO  metoprolol tartrate (LOPRESSOR) 25 MG tablet Take 12.5 mg by mouth 2 (two) times daily.    [provider]  Multiple Vitamin (MULTIVITAMIN  WITH MINERALS) TABS tablet Take 1 tablet by mouth daily. Patient not taking: Reported on 05/11/2018 01/28/18   Cristal Ford, DO  thiamine 100 MG tablet Take 1 tablet (100 mg total) by mouth daily. Patient not taking: Reported on 05/11/2018 01/28/18   Cristal Ford, DO    Family History Family History  Problem Relation Age of Onset  . Emphysema Mother   . Cirrhosis Father     Social History Social History   Tobacco Use  . Smoking status: Former Smoker    Types: Cigars    Last attempt to quit: 10/27/2017    Years since quitting: 0.7  . Smokeless tobacco: Former Network engineer Use Topics  . Alcohol use: Yes    Alcohol/week: 30.0 standard drinks    Types: 30 Cans of beer per week    Frequency: Never    Comment: drinks daily  . Drug use: No     Allergies   Patient has no known allergies.   Review of Systems Review of Systems  All other systems reviewed and are negative.    Physical Exam Updated Vital Signs BP (!) 165/77 (BP Location: Right Arm)   Pulse 76   Temp 98.2 F (36.8 C) (Oral)   Resp 18   SpO2 96%   Physical Exam  Constitutional: He is oriented to person, place, and time. He appears well-developed and well-nourished.  HENT:  Head: Normocephalic and atraumatic.  Eyes: Pupils are equal, round, and reactive to light. Conjunctivae and EOM are normal. Right eye exhibits no discharge. Left eye exhibits no discharge. No scleral icterus.  Neck: Normal range of motion. Neck supple. No JVD present.  Cardiovascular: Normal rate, regular rhythm and normal heart sounds. Exam reveals no gallop and no friction rub.  No murmur heard. Pulmonary/Chest: Effort normal and breath sounds normal. No respiratory distress. He has no wheezes. He has no rales. He exhibits no tenderness.  Abdominal: Soft. He exhibits no distension and no mass. There is no tenderness. There is no rebound and no guarding.  Musculoskeletal: Normal range of motion. He exhibits no edema or tenderness.   No pitting edema  Neurological: He is alert and oriented to person, place, and time.  Skin: Skin is warm and dry.  Psychiatric: He has a normal mood and affect. His behavior is normal. Judgment and thought content normal.  Nursing note and vitals reviewed.    ED Treatments / Results  Labs (all labs ordered are listed, but only abnormal results are displayed) Labs Reviewed  BASIC METABOLIC PANEL - Abnormal; Notable for the following components:      Result Value   Chloride 93 (*)    BUN 78 (*)    Creatinine, Ser 12.90 (*)    Calcium 8.2 (*)    GFR calc non Af Amer 4 (*)    GFR calc Af Amer 4 (*)    Anion gap 21 (*)    All other components within normal limits  CBC - Abnormal; Notable for the following components:   RBC 3.48 (*)  Hemoglobin 11.2 (*)    HCT 33.7 (*)    All other components within normal limits  BRAIN NATRIURETIC PEPTIDE - Abnormal; Notable for the following components:   B Natriuretic Peptide 678.7 (*)    All other components within normal limits  I-STAT TROPONIN, ED    EKG EKG Interpretation  Date/Time:  Tuesday August 10 2018 02:02:26 EDT Ventricular Rate:  83 PR Interval:    QRS Duration: 94 QT Interval:  424 QTC Calculation: 499 R Axis:   -13 Text Interpretation:  Sinus rhythm Anterior infarct, old No significant change since last tracing Confirmed by Pryor Curia [phone]) on 08/10/2018 2:20:49 AM   Radiology Dg Chest 2 View  Result Date: 08/10/2018 CLINICAL DATA:  Left-sided chest pain EXAM: CHEST - 2 VIEW COMPARISON:  07/20/2018, 05/11/2018 FINDINGS: No focal opacity or pleural effusion. Borderline cardiomegaly. Aortic atherosclerosis. No pneumothorax. IMPRESSION: No active cardiopulmonary disease. Electronically Signed   By: Donavan Foil M.D.   On: 08/10/2018 01:34    Procedures Procedures (including critical care time)  Medications Ordered in ED Medications - No data to display   Initial Impression / Assessment and Plan / ED  Course  I have reviewed the triage vital signs and the nursing notes.  Pertinent labs & imaging results that were available during my care of the patient were reviewed by me and considered in my medical decision making (see chart for details).     Patient presents with a chief complaint of chest pain.  He states that it began yesterday.  He states that he thinks he has too much fluid on his system.  He is scheduled to be dialyzed later today.  Chest x-ray shows no evidence of volume overload.  Troponin is negative.  EKG shows no ischemic changes.  There are no concerning arrhythmias.  Laboratory work-up is at or near baseline.  Patient discussed with Dr. Leonides Schanz, who reviewed the labs and EKG with me, and agrees with plan for discharge.  Final Clinical Impressions(s) / ED Diagnoses   Final diagnoses:  Chest pain, unspecified type    ED Discharge Orders    None       Jacek, Colson, PA-C 08/10/18 0515    Ward, Delice Bison, DO 08/10/18 [phone]

## 2018-08-10 NOTE — ED Triage Notes (Signed)
Pt presents from home with CP x 2.5 hrs; L sided, non-radiating, non-reproducible; pt given 324mg  ASA, 2 SL NTG with improvement of pain from 3/10 to 7/10

## 2018-08-11 ENCOUNTER — Emergency Department (HOSPITAL_COMMUNITY)
Admission: EM | Admit: 2018-08-11 | Discharge: 2018-08-11 | Disposition: A | Discharge status: Home / Self Care | Payer: Medicare Other | Attending: Emergency Medicine | Admitting: Emergency Medicine

## 2018-08-11 ENCOUNTER — Emergency Department (HOSPITAL_COMMUNITY): Payer: Medicare Other

## 2018-08-11 ENCOUNTER — Encounter (HOSPITAL_COMMUNITY): Payer: Self-pay | Admitting: Emergency Medicine

## 2018-08-11 ENCOUNTER — Other Ambulatory Visit: Payer: Self-pay

## 2018-08-11 DIAGNOSIS — Z23 Encounter for immunization: Secondary | ICD-10-CM | POA: Diagnosis not present

## 2018-08-11 DIAGNOSIS — Z87891 Personal history of nicotine dependence: Secondary | ICD-10-CM | POA: Diagnosis not present

## 2018-08-11 DIAGNOSIS — R079 Chest pain, unspecified: Secondary | ICD-10-CM

## 2018-08-11 DIAGNOSIS — Z79899 Other long term (current) drug therapy: Secondary | ICD-10-CM | POA: Diagnosis not present

## 2018-08-11 DIAGNOSIS — N186 End stage renal disease: Secondary | ICD-10-CM | POA: Insufficient documentation

## 2018-08-11 DIAGNOSIS — N189 Chronic kidney disease, unspecified: Secondary | ICD-10-CM

## 2018-08-11 DIAGNOSIS — I5033 Acute on chronic diastolic (congestive) heart failure: Secondary | ICD-10-CM | POA: Diagnosis not present

## 2018-08-11 DIAGNOSIS — Z7982 Long term (current) use of aspirin: Secondary | ICD-10-CM | POA: Insufficient documentation

## 2018-08-11 DIAGNOSIS — D509 Iron deficiency anemia, unspecified: Secondary | ICD-10-CM | POA: Diagnosis not present

## 2018-08-11 DIAGNOSIS — I120 Hypertensive chronic kidney disease with stage 5 chronic kidney disease or end stage renal disease: Secondary | ICD-10-CM | POA: Diagnosis not present

## 2018-08-11 DIAGNOSIS — D631 Anemia in chronic kidney disease: Secondary | ICD-10-CM | POA: Insufficient documentation

## 2018-08-11 DIAGNOSIS — I132 Hypertensive heart and chronic kidney disease with heart failure and with stage 5 chronic kidney disease, or end stage renal disease: Secondary | ICD-10-CM | POA: Diagnosis not present

## 2018-08-11 DIAGNOSIS — R0789 Other chest pain: Secondary | ICD-10-CM | POA: Diagnosis not present

## 2018-08-11 DIAGNOSIS — Z992 Dependence on renal dialysis: Secondary | ICD-10-CM

## 2018-08-11 DIAGNOSIS — N2581 Secondary hyperparathyroidism of renal origin: Secondary | ICD-10-CM | POA: Diagnosis not present

## 2018-08-11 LAB — CBC WITH DIFFERENTIAL/PLATELET
Abs Immature Granulocytes: 0.02 10*3/uL (ref 0.00–0.07)
Basophils Absolute: 0.1 10*3/uL (ref 0.0–0.1)
Basophils Relative: 1 %
EOS PCT: 12 %
Eosinophils Absolute: 0.9 10*3/uL — ABNORMAL HIGH (ref 0.0–0.5)
HCT: 30.4 % — ABNORMAL LOW (ref 39.0–52.0)
HEMOGLOBIN: 10.5 g/dL — AB (ref 13.0–17.0)
Immature Granulocytes: 0 %
LYMPHS ABS: 1.3 10*3/uL (ref 0.7–4.0)
LYMPHS PCT: 18 %
MCH: 32.9 pg (ref 26.0–34.0)
MCHC: 34.5 g/dL (ref 30.0–36.0)
MCV: 95.3 fL (ref 80.0–100.0)
MONO ABS: 0.8 10*3/uL (ref 0.1–1.0)
Monocytes Relative: 12 %
Neutro Abs: 4 10*3/uL (ref 1.7–7.7)
Neutrophils Relative %: 57 %
Platelets: 226 10*3/uL (ref 150–400)
RBC: 3.19 MIL/uL — ABNORMAL LOW (ref 4.22–5.81)
RDW: 13.4 % (ref 11.5–15.5)
WBC: 7.1 10*3/uL (ref 4.0–10.5)
nRBC: 0 % (ref 0.0–0.2)

## 2018-08-11 LAB — BASIC METABOLIC PANEL
Anion gap: 18 — ABNORMAL HIGH (ref 5–15)
BUN: 92 mg/dL — AB (ref 8–23)
CHLORIDE: 96 mmol/L — AB (ref 98–111)
CO2: 25 mmol/L (ref 22–32)
CREATININE: 14.87 mg/dL — AB (ref 0.61–1.24)
Calcium: 8 mg/dL — ABNORMAL LOW (ref 8.9–10.3)
GFR calc Af Amer: 3 mL/min — ABNORMAL LOW (ref >60)
GFR calc non Af Amer: 3 mL/min — ABNORMAL LOW (ref >60)
Glucose, Bld: 84 mg/dL (ref 70–99)
Potassium: 5 mmol/L (ref 3.5–5.1)
Sodium: 139 mmol/L (ref 135–145)

## 2018-08-11 LAB — I-STAT TROPONIN, ED: TROPONIN I, POC: 0.04 ng/mL (ref 0.00–0.08)

## 2018-08-11 MED ORDER — GI COCKTAIL ~~LOC~~
30.0000 mL | Freq: Once | ORAL | Status: AC
  Administered 2018-08-11: 30 mL via ORAL
  Filled 2018-08-11: qty 30

## 2018-08-11 MED ORDER — ONDANSETRON HCL 4 MG/2ML IJ SOLN
4.0000 mg | Freq: Once | INTRAMUSCULAR | Status: AC
  Administered 2018-08-11: 4 mg via INTRAVENOUS
  Filled 2018-08-11: qty 2

## 2018-08-11 MED ORDER — NITROGLYCERIN 0.4 MG SL SUBL
0.4000 mg | SUBLINGUAL_TABLET | SUBLINGUAL | Status: DC | PRN
  Administered 2018-08-11: 0.4 mg via SUBLINGUAL
  Filled 2018-08-11: qty 1

## 2018-08-11 MED ORDER — NITROGLYCERIN 0.4 MG SL SUBL: 0.4000 mg | SUBLINGUAL_TABLET | SUBLINGUAL | 0 refills | Status: DC | PRN

## 2018-08-11 NOTE — ED Notes (Signed)
Patient was discharged via Taxi voucher  To Dialysis center [facility].

## 2018-08-11 NOTE — ED Provider Notes (Signed)
[place] EMERGENCY DEPARTMENT Provider Note   CSN: [id number] Arrival date & time: 08/11/18  0051     History   Chief Complaint Chief Complaint  Patient presents with  . Chest Pain    HPI Kenneth [name] is a 62 y.o. male.  The history is provided by the patient.  He has history of hypertension, end-stage renal disease on hemodialysis, combined systolic and diastolic heart failure and comes in because of chest pain.  Pain is a dull pain in the left anterior chest without radiation.  He rates pain at 8/10.  There is associated nausea and vomiting but no dyspnea or diaphoresis.  Pain is better after emesis, but otherwise nothing affected.  Pain came on at about 10 PM while at rest.  He had similar pain yesterday.  Of note, he was supposed to have had dialysis earlier today but missed the appointment.  Please note, triage note does state that he had full dialysis session today, and patient is clear to me that he did not have dialysis today.  He was brought in by EMS who gave him aspirin and nitroglycerin.  Pain is now completely gone.  Past Medical History:  Diagnosis Date  . Acute CHF ([place]) 01/2018  . Cardiomyopathy secondary    likely related to HTN heart disease; possibly ETOH related as well  . Chronic combined systolic and diastolic heart failure (HCC)    Echocardiogram 09/22/11: Moderate LVH, EF 27-25%, grade 3 diastolic dysfunction, mild MR, moderate to severe LAE, mild RVE, mild to moderate TR, small to moderate pericardial effusion  . ESRD (end stage renal disease) on dialysis [facility])    due to hypertensive nephrosclerosis  . History of alcohol abuse   . Hypertension     Patient Active Problem List   Diagnosis Date Noted  . ESRD (end stage renal disease) on dialysis ([place]) 07/20/2018  . Acute respiratory failure ([place]) 05/11/2018  . Cardiac arrest ([place]) 03/09/2018  . Acute encephalopathy   . Acute on chronic respiratory failure ([place]) 02/16/2018  .  Tobacco dependence 02/16/2018  . Pulmonary edema 01/26/2018  . Acute respiratory failure with hypoxia ([place]) 01/26/2018  . Atrial fibrillation and flutter ([place])   . Shortness of breath   . Acute on chronic diastolic CHF (congestive heart failure) ([place])   . Hypothermia 10/08/2016  . ESRD on hemodialysis ([place]) 10/08/2016  . Fall 10/08/2016  . Syncope 10/07/2016  . Near syncope 07/07/2016  . Cardiomyopathy ([place]) 07/07/2016  . Ventricular tachycardia, non-sustained ([place]) 07/06/2016  . Hypoglycemia 07/06/2016  . Alcohol abuse 05/13/2016  . Hypertensive emergency 12/08/2013  . Cardiomyopathy secondary 10/09/2011  . Essential hypertension 10/09/2011  . Alcohol withdrawal delirium ([place]) 09/25/2011  . Chronic combined systolic and diastolic CHF (congestive heart failure) ([place]) 09/24/2011    Past Surgical History:  Procedure Laterality Date  . AV FISTULA PLACEMENT Left 05/14/2016   Procedure: LEFT ARM BASILIC VEIN TRANSPOSITION;  Surgeon: Rosetta Posner, MD;  Location: [facility];  Service: Vascular;  Laterality: Left;  . PERIPHERAL VASCULAR CATHETERIZATION N/A 05/13/2016   Procedure: Dialysis/Perma Catheter Insertion;  Surgeon: Serafina Mitchell, MD;  Location: [facility];  Service: Cardiovascular;  Laterality: N/A;        Home Medications    Prior to Admission medications   Medication Sig Start Date End Date Taking? Authorizing Provider  amLODipine (NORVASC) 10 MG tablet Take 10 mg by mouth at bedtime.   Yes [provider]  aspirin EC 81 MG  tablet Take 81 mg by mouth daily.   Yes [provider]  divalproex (DEPAKOTE) 500 MG DR tablet Take 1 tablet (500 mg total) by mouth 2 (two) times daily. 03/27/18 08/11/26 Yes Dessa Phi, DO  metoprolol tartrate (LOPRESSOR) 25 MG tablet Take 12.5 mg by mouth 2 (two) times daily.   Yes [provider]  folic acid (FOLVITE) 1 MG tablet Take 1 tablet (1 mg total) by mouth daily. Patient not taking: Reported on 05/11/2018  01/28/18   Cristal Ford, DO  Multiple Vitamin (MULTIVITAMIN WITH MINERALS) TABS tablet Take 1 tablet by mouth daily. Patient not taking: Reported on 05/11/2018 01/28/18   Cristal Ford, DO  thiamine 100 MG tablet Take 1 tablet (100 mg total) by mouth daily. Patient not taking: Reported on 05/11/2018 01/28/18   Cristal Ford, DO    Family History Family History  Problem Relation Age of Onset  . Emphysema Mother   . Cirrhosis Father     Social History Social History   Tobacco Use  . Smoking status: Former Smoker    Types: Cigars    Last attempt to quit: 10/27/2017    Years since quitting: 0.7  . Smokeless tobacco: Former Network engineer Use Topics  . Alcohol use: Yes    Alcohol/week: 30.0 standard drinks    Types: 30 Cans of beer per week    Frequency: Never    Comment: drinks daily  . Drug use: No     Allergies   Patient has no known allergies.   Review of Systems Review of Systems  All other systems reviewed and are negative.    Physical Exam Updated Vital Signs BP (!) 165/94 (BP Location: Right Arm) Comment: Simultaneous filing. User may not have seen previous data.  Pulse 78 Comment: Simultaneous filing. User may not have seen previous data.  Temp 98.2 F (36.8 C) (Oral)   Resp 17 Comment: Simultaneous filing. User may not have seen previous data.  Ht 6' (1.829 m)   Wt 86.2 kg   SpO2 98% Comment: Simultaneous filing. User may not have seen previous data.  BMI 25.77 kg/m   Physical Exam  Nursing note and vitals reviewed.  62 year old male, resting comfortably and in no acute distress. Vital signs are significant for elevated blood pressure. Oxygen saturation is 98%, which is normal. Head is normocephalic and atraumatic. PERRLA, EOMI. Oropharynx is clear. Neck is nontender and supple without adenopathy or JVD. Back is nontender and there is no CVA tenderness. Lungs are clear without rales, wheezes, or rhonchi. Chest is nontender. Heart has regular rate  and rhythm without murmur. Abdomen is soft, flat, with mild epigastric tenderness.  There is no rebound or guarding.  There are no masses or hepatosplenomegaly and peristalsis is hypoactive. Extremities have no cyanosis or edema, full range of motion is present.  AV fistula present in the left upper arm with thrill present. Skin is warm and dry without rash. Neurologic: Mental status is normal, cranial nerves are intact, there are no motor or sensory deficits.  ED Treatments / Results  Labs (all labs ordered are listed, but only abnormal results are displayed) Labs Reviewed  BASIC METABOLIC PANEL - Abnormal; Notable for the following components:      Result Value   Chloride 96 (*)    BUN 92 (*)    Creatinine, Ser 14.87 (*)    Calcium 8.0 (*)    GFR calc non Af Amer 3 (*)    GFR calc Af  Amer 3 (*)    Anion gap 18 (*)    All other components within normal limits  CBC WITH DIFFERENTIAL/PLATELET - Abnormal; Notable for the following components:   RBC 3.19 (*)    Hemoglobin 10.5 (*)    HCT 30.4 (*)    Eosinophils Absolute 0.9 (*)    All other components within normal limits  I-STAT TROPONIN, ED    EKG EKG Interpretation  Date/Time:  Wednesday August 11 2018 00:59:15 EDT Ventricular Rate:  79 PR Interval:    QRS Duration: 95 QT Interval:  422 QTC Calculation: 484 R Axis:   20 Text Interpretation:  Sinus rhythm Anteroseptal infarct, old When compared with ECG of 08/10/2018, No significant change was found Confirmed by Delora Fuel ([id number]) on 08/11/2018 1:04:50 AM   Radiology Dg Chest 2 View  Result Date: 08/10/2018 CLINICAL DATA:  Left-sided chest pain EXAM: CHEST - 2 VIEW COMPARISON:  07/20/2018, 05/11/2018 FINDINGS: No focal opacity or pleural effusion. Borderline cardiomegaly. Aortic atherosclerosis. No pneumothorax. IMPRESSION: No active cardiopulmonary disease. Electronically Signed   By: Donavan Foil M.D.   On: 08/10/2018 01:34    Procedures Procedures    Medications Ordered in ED Medications  nitroGLYCERIN (NITROSTAT) SL tablet 0.4 mg (0.4 mg Sublingual Given 08/11/18 0618)  ondansetron (ZOFRAN) injection 4 mg (4 mg Intravenous Given 08/11/18 0224)  gi cocktail (Maalox,Lidocaine,Donnatal) (30 mLs Oral Given 08/11/18 0224)     Initial Impression / Assessment and Plan / ED Course  I have reviewed the triage vital signs and the nursing notes.  Pertinent labs & imaging results that were available during my care of the patient were reviewed by me and considered in my medical decision making (see chart for details).  Chest pain which has been relieved with nitroglycerin.  End-stage renal disease on dialysis, patient missed his last dialysis session.  Epigastric pain and nausea with vomiting, consider GERD or peptic ulcer disease.  Old records are reviewed confirming ED visit yesterday with similar complaints and x-ray showing no evidence of pulmonary edema.  Will repeat chest x-ray today.  We will also give GI cocktail and ondansetron.  Additionally, he is noted to have had a hospitalization for cardiac arrest in May, and multiple hospitalizations for pulmonary edema requiring emergent dialysis.  4:06 AM ED work-up is unremarkable.  BUN and creatinine are elevated consistent with known history of end-stage renal disease, mild anemia is present which is unchanged from baseline.  Chest x-ray shows no evidence of pulmonary edema, troponin is normal.  Potassium is in the top range of normal, and has increased significantly from yesterday.  I am concerned that patient should not go another 24 hours without dialysis.  Case is discussed with Dr. Moshe Cipro, on-call for nephrology.  She will be making arrangements for patient to receive his dialysis today.  6:29 AM Patient's dialysis center is able to accommodate him and he is discharged to go directly there for his dialysis.  He is also given a prescription for nitroglycerin.  Final Clinical  Impressions(s) / ED Diagnoses   Final diagnoses:  Nonspecific chest pain  End-stage renal disease on hemodialysis ([place])  Anemia associated with chronic renal failure    ED Discharge Orders         Ordered    nitroGLYCERIN (NITROSTAT) 0.4 MG SL tablet  Every 5 min PRN     08/11/18 0092           Delora Fuel, MD 33/00/76 [phone]

## 2018-08-11 NOTE — ED Notes (Signed)
Patient transported to X-ray 

## 2018-08-11 NOTE — ED Triage Notes (Signed)
Patient from home with chest pain and shortness of breath.  Patient was seen last night for the same.  NSR on monitor, 200/110, pain 7/10 and after 2 nitro it is a 2/10.  BP 205/80 after the nitro.  Patient given 324mg  ASA.

## 2018-08-11 NOTE — Discharge Instructions (Signed)
Go directly to your dialysis center - they will dialyze you today!

## 2018-08-11 NOTE — ED Notes (Signed)
Patient had full dialysis treatment today. Goes to dialysis Tue, Thur, Sat.

## 2018-08-11 NOTE — ED Notes (Signed)
Patient is sitting on the end of stretcher states his chest pain is coming back.

## 2018-08-12 DIAGNOSIS — N2581 Secondary hyperparathyroidism of renal origin: Secondary | ICD-10-CM | POA: Diagnosis not present

## 2018-08-12 DIAGNOSIS — Z23 Encounter for immunization: Secondary | ICD-10-CM | POA: Diagnosis not present

## 2018-08-12 DIAGNOSIS — N186 End stage renal disease: Secondary | ICD-10-CM | POA: Diagnosis not present

## 2018-08-12 DIAGNOSIS — D509 Iron deficiency anemia, unspecified: Secondary | ICD-10-CM | POA: Diagnosis not present

## 2018-08-12 LAB — PATHOLOGY, SURGICAL

## 2018-08-14 DIAGNOSIS — N186 End stage renal disease: Secondary | ICD-10-CM | POA: Diagnosis not present

## 2018-08-14 DIAGNOSIS — N2581 Secondary hyperparathyroidism of renal origin: Secondary | ICD-10-CM | POA: Diagnosis not present

## 2018-08-14 DIAGNOSIS — D509 Iron deficiency anemia, unspecified: Secondary | ICD-10-CM | POA: Diagnosis not present

## 2018-08-14 DIAGNOSIS — Z23 Encounter for immunization: Secondary | ICD-10-CM | POA: Diagnosis not present

## 2018-08-17 DIAGNOSIS — N2581 Secondary hyperparathyroidism of renal origin: Secondary | ICD-10-CM | POA: Diagnosis not present

## 2018-08-17 DIAGNOSIS — N186 End stage renal disease: Secondary | ICD-10-CM | POA: Diagnosis not present

## 2018-08-17 DIAGNOSIS — D509 Iron deficiency anemia, unspecified: Secondary | ICD-10-CM | POA: Diagnosis not present

## 2018-08-17 DIAGNOSIS — Z23 Encounter for immunization: Secondary | ICD-10-CM | POA: Diagnosis not present

## 2018-08-19 DIAGNOSIS — Z23 Encounter for immunization: Secondary | ICD-10-CM | POA: Diagnosis not present

## 2018-08-19 DIAGNOSIS — D509 Iron deficiency anemia, unspecified: Secondary | ICD-10-CM | POA: Diagnosis not present

## 2018-08-19 DIAGNOSIS — N186 End stage renal disease: Secondary | ICD-10-CM | POA: Diagnosis not present

## 2018-08-19 DIAGNOSIS — N2581 Secondary hyperparathyroidism of renal origin: Secondary | ICD-10-CM | POA: Diagnosis not present

## 2018-08-21 DIAGNOSIS — N186 End stage renal disease: Secondary | ICD-10-CM | POA: Diagnosis not present

## 2018-08-21 DIAGNOSIS — D509 Iron deficiency anemia, unspecified: Secondary | ICD-10-CM | POA: Diagnosis not present

## 2018-08-21 DIAGNOSIS — Z23 Encounter for immunization: Secondary | ICD-10-CM | POA: Diagnosis not present

## 2018-08-21 DIAGNOSIS — N2581 Secondary hyperparathyroidism of renal origin: Secondary | ICD-10-CM | POA: Diagnosis not present

## 2018-08-24 DIAGNOSIS — D509 Iron deficiency anemia, unspecified: Secondary | ICD-10-CM | POA: Diagnosis not present

## 2018-08-24 DIAGNOSIS — Z23 Encounter for immunization: Secondary | ICD-10-CM | POA: Diagnosis not present

## 2018-08-24 DIAGNOSIS — N2581 Secondary hyperparathyroidism of renal origin: Secondary | ICD-10-CM | POA: Diagnosis not present

## 2018-08-24 DIAGNOSIS — N186 End stage renal disease: Secondary | ICD-10-CM | POA: Diagnosis not present

## 2018-08-26 DIAGNOSIS — N2581 Secondary hyperparathyroidism of renal origin: Secondary | ICD-10-CM | POA: Diagnosis not present

## 2018-08-26 DIAGNOSIS — Z23 Encounter for immunization: Secondary | ICD-10-CM | POA: Diagnosis not present

## 2018-08-26 DIAGNOSIS — D509 Iron deficiency anemia, unspecified: Secondary | ICD-10-CM | POA: Diagnosis not present

## 2018-08-26 DIAGNOSIS — N186 End stage renal disease: Secondary | ICD-10-CM | POA: Diagnosis not present

## 2018-08-27 DIAGNOSIS — I129 Hypertensive chronic kidney disease with stage 1 through stage 4 chronic kidney disease, or unspecified chronic kidney disease: Secondary | ICD-10-CM | POA: Diagnosis not present

## 2018-08-27 DIAGNOSIS — Z992 Dependence on renal dialysis: Secondary | ICD-10-CM | POA: Diagnosis not present

## 2018-08-27 DIAGNOSIS — I871 Compression of vein: Secondary | ICD-10-CM | POA: Diagnosis not present

## 2018-08-27 DIAGNOSIS — N186 End stage renal disease: Secondary | ICD-10-CM | POA: Diagnosis not present

## 2018-08-27 DIAGNOSIS — T82858A Stenosis of vascular prosthetic devices, implants and grafts, initial encounter: Secondary | ICD-10-CM | POA: Diagnosis not present

## 2018-08-27 DIAGNOSIS — Z4802 Encounter for removal of sutures: Secondary | ICD-10-CM | POA: Insufficient documentation

## 2018-08-28 DIAGNOSIS — D509 Iron deficiency anemia, unspecified: Secondary | ICD-10-CM | POA: Diagnosis not present

## 2018-08-28 DIAGNOSIS — N186 End stage renal disease: Secondary | ICD-10-CM | POA: Diagnosis not present

## 2018-08-28 DIAGNOSIS — N2581 Secondary hyperparathyroidism of renal origin: Secondary | ICD-10-CM | POA: Diagnosis not present

## 2018-08-28 DIAGNOSIS — D631 Anemia in chronic kidney disease: Secondary | ICD-10-CM | POA: Diagnosis not present

## 2018-08-31 DIAGNOSIS — N2581 Secondary hyperparathyroidism of renal origin: Secondary | ICD-10-CM | POA: Diagnosis not present

## 2018-08-31 DIAGNOSIS — D509 Iron deficiency anemia, unspecified: Secondary | ICD-10-CM | POA: Diagnosis not present

## 2018-08-31 DIAGNOSIS — D631 Anemia in chronic kidney disease: Secondary | ICD-10-CM | POA: Diagnosis not present

## 2018-08-31 DIAGNOSIS — N186 End stage renal disease: Secondary | ICD-10-CM | POA: Diagnosis not present

## 2018-09-01 ENCOUNTER — Other Ambulatory Visit: Payer: Self-pay

## 2018-09-01 ENCOUNTER — Emergency Department (HOSPITAL_COMMUNITY): Payer: Medicare Other

## 2018-09-01 ENCOUNTER — Inpatient Hospital Stay (HOSPITAL_COMMUNITY)
Admission: EM | Admit: 2018-09-01 | Discharge: 2018-09-07 | DRG: 286 | Disposition: A | Discharge status: Home Health | Payer: Medicare Other | Attending: Internal Medicine | Admitting: Internal Medicine

## 2018-09-01 ENCOUNTER — Encounter (HOSPITAL_COMMUNITY): Payer: Self-pay

## 2018-09-01 ENCOUNTER — Inpatient Hospital Stay (HOSPITAL_COMMUNITY): Payer: Medicare Other

## 2018-09-01 DIAGNOSIS — R079 Chest pain, unspecified: Secondary | ICD-10-CM | POA: Diagnosis not present

## 2018-09-01 DIAGNOSIS — I200 Unstable angina: Secondary | ICD-10-CM

## 2018-09-01 DIAGNOSIS — Z992 Dependence on renal dialysis: Secondary | ICD-10-CM | POA: Diagnosis not present

## 2018-09-01 DIAGNOSIS — Z79899 Other long term (current) drug therapy: Secondary | ICD-10-CM

## 2018-09-01 DIAGNOSIS — R0789 Other chest pain: Secondary | ICD-10-CM | POA: Diagnosis not present

## 2018-09-01 DIAGNOSIS — N186 End stage renal disease: Secondary | ICD-10-CM

## 2018-09-01 DIAGNOSIS — Z87891 Personal history of nicotine dependence: Secondary | ICD-10-CM

## 2018-09-01 DIAGNOSIS — I132 Hypertensive heart and chronic kidney disease with heart failure and with stage 5 chronic kidney disease, or end stage renal disease: Secondary | ICD-10-CM | POA: Diagnosis not present

## 2018-09-01 DIAGNOSIS — I340 Nonrheumatic mitral (valve) insufficiency: Secondary | ICD-10-CM

## 2018-09-01 DIAGNOSIS — R9431 Abnormal electrocardiogram [ECG] [EKG]: Secondary | ICD-10-CM

## 2018-09-01 DIAGNOSIS — I4892 Unspecified atrial flutter: Secondary | ICD-10-CM | POA: Diagnosis present

## 2018-09-01 DIAGNOSIS — I4891 Unspecified atrial fibrillation: Secondary | ICD-10-CM | POA: Diagnosis not present

## 2018-09-01 DIAGNOSIS — I5042 Chronic combined systolic (congestive) and diastolic (congestive) heart failure: Secondary | ICD-10-CM | POA: Diagnosis present

## 2018-09-01 DIAGNOSIS — I25110 Atherosclerotic heart disease of native coronary artery with unstable angina pectoris: Secondary | ICD-10-CM | POA: Diagnosis not present

## 2018-09-01 DIAGNOSIS — I428 Other cardiomyopathies: Secondary | ICD-10-CM | POA: Diagnosis present

## 2018-09-01 DIAGNOSIS — I10 Essential (primary) hypertension: Secondary | ICD-10-CM

## 2018-09-01 DIAGNOSIS — F1020 Alcohol dependence, uncomplicated: Secondary | ICD-10-CM | POA: Diagnosis not present

## 2018-09-01 DIAGNOSIS — I5032 Chronic diastolic (congestive) heart failure: Secondary | ICD-10-CM

## 2018-09-01 DIAGNOSIS — R000 Tachycardia, unspecified: Secondary | ICD-10-CM | POA: Diagnosis not present

## 2018-09-01 DIAGNOSIS — I249 Acute ischemic heart disease, unspecified: Secondary | ICD-10-CM | POA: Diagnosis present

## 2018-09-01 DIAGNOSIS — D631 Anemia in chronic kidney disease: Secondary | ICD-10-CM | POA: Diagnosis present

## 2018-09-01 DIAGNOSIS — N2581 Secondary hyperparathyroidism of renal origin: Secondary | ICD-10-CM | POA: Diagnosis not present

## 2018-09-01 DIAGNOSIS — I499 Cardiac arrhythmia, unspecified: Secondary | ICD-10-CM | POA: Diagnosis not present

## 2018-09-01 DIAGNOSIS — I208 Other forms of angina pectoris: Secondary | ICD-10-CM

## 2018-09-01 DIAGNOSIS — Z01810 Encounter for preprocedural cardiovascular examination: Secondary | ICD-10-CM | POA: Diagnosis not present

## 2018-09-01 DIAGNOSIS — Z9115 Patient's noncompliance with renal dialysis: Secondary | ICD-10-CM

## 2018-09-01 DIAGNOSIS — R1111 Vomiting without nausea: Secondary | ICD-10-CM | POA: Diagnosis not present

## 2018-09-01 LAB — BASIC METABOLIC PANEL
ANION GAP: 15 (ref 5–15)
BUN: 60 mg/dL — AB (ref 8–23)
CO2: 29 mmol/L (ref 22–32)
Calcium: 8.5 mg/dL — ABNORMAL LOW (ref 8.9–10.3)
Chloride: 94 mmol/L — ABNORMAL LOW (ref 98–111)
Creatinine, Ser: 10.82 mg/dL — ABNORMAL HIGH (ref 0.61–1.24)
GFR, EST AFRICAN AMERICAN: 5 mL/min — AB (ref >60)
GFR, EST NON AFRICAN AMERICAN: 4 mL/min — AB (ref >60)
Glucose, Bld: 88 mg/dL (ref 70–99)
Potassium: 4 mmol/L (ref 3.5–5.1)
SODIUM: 138 mmol/L (ref 135–145)

## 2018-09-01 LAB — CBC
HCT: 32.6 % — ABNORMAL LOW (ref 39.0–52.0)
HEMOGLOBIN: 10.9 g/dL — AB (ref 13.0–17.0)
MCH: 32.7 pg (ref 26.0–34.0)
MCHC: 33.4 g/dL (ref 30.0–36.0)
MCV: 97.9 fL (ref 80.0–100.0)
NRBC: 0 % (ref 0.0–0.2)
Platelets: 218 10*3/uL (ref 150–400)
RBC: 3.33 MIL/uL — ABNORMAL LOW (ref 4.22–5.81)
RDW: 14.4 % (ref 11.5–15.5)
WBC: 7.2 10*3/uL (ref 4.0–10.5)

## 2018-09-01 LAB — I-STAT CHEM 8, ED
BUN: 55 mg/dL — ABNORMAL HIGH (ref 8–23)
CALCIUM ION: 0.94 mmol/L — AB (ref 1.15–1.40)
Chloride: 95 mmol/L — ABNORMAL LOW (ref 98–111)
Creatinine, Ser: 12 mg/dL — ABNORMAL HIGH (ref 0.61–1.24)
GLUCOSE: 87 mg/dL (ref 70–99)
HCT: 32 % — ABNORMAL LOW (ref 39.0–52.0)
HEMOGLOBIN: 10.9 g/dL — AB (ref 13.0–17.0)
POTASSIUM: 4 mmol/L (ref 3.5–5.1)
SODIUM: 138 mmol/L (ref 135–145)
TCO2: 33 mmol/L — ABNORMAL HIGH (ref 22–32)

## 2018-09-01 LAB — TROPONIN I
Troponin I: 0.03 ng/mL (ref <0.03)
Troponin I: 0.03 ng/mL (ref <0.03)

## 2018-09-01 LAB — I-STAT TROPONIN, ED: TROPONIN I, POC: 0.04 ng/mL (ref 0.00–0.08)

## 2018-09-01 LAB — ECHOCARDIOGRAM COMPLETE

## 2018-09-01 LAB — MRSA PCR SCREENING: MRSA BY PCR: NEGATIVE

## 2018-09-01 MED ORDER — VITAMIN B-1 100 MG PO TABS
100.0000 mg | ORAL_TABLET | Freq: Every day | ORAL | Status: DC
  Administered 2018-09-02 – 2018-09-07 (×6): 100 mg via ORAL
  Filled 2018-09-01 (×7): qty 1

## 2018-09-01 MED ORDER — ADULT MULTIVITAMIN W/MINERALS CH
1.0000 | ORAL_TABLET | Freq: Every day | ORAL | Status: DC
  Administered 2018-09-02 – 2018-09-07 (×6): 1 via ORAL
  Filled 2018-09-01 (×6): qty 1

## 2018-09-01 MED ORDER — ONDANSETRON HCL 4 MG PO TABS: 4.0000 mg | ORAL_TABLET | Freq: Four times a day (QID) | ORAL | Status: DC | PRN

## 2018-09-01 MED ORDER — HEPARIN SODIUM (PORCINE) 5000 UNIT/ML IJ SOLN
5000.0000 [IU] | Freq: Three times a day (TID) | INTRAMUSCULAR | Status: DC
  Administered 2018-09-01 – 2018-09-02 (×3): 5000 [IU] via SUBCUTANEOUS
  Filled 2018-09-01 (×3): qty 1

## 2018-09-01 MED ORDER — LANTHANUM CARBONATE 500 MG PO CHEW
2000.0000 mg | CHEWABLE_TABLET | Freq: Three times a day (TID) | ORAL | Status: DC
  Administered 2018-09-03 – 2018-09-07 (×11): 2000 mg via ORAL
  Filled 2018-09-01 (×11): qty 4

## 2018-09-01 MED ORDER — ZOLPIDEM TARTRATE 5 MG PO TABS: 5.0000 mg | ORAL_TABLET | Freq: Every evening | ORAL | Status: DC | PRN

## 2018-09-01 MED ORDER — CINACALCET HCL 30 MG PO TABS
60.0000 mg | ORAL_TABLET | ORAL | Status: DC
  Administered 2018-09-04: 60 mg via ORAL
  Filled 2018-09-01 (×2): qty 2

## 2018-09-01 MED ORDER — FOLIC ACID 1 MG PO TABS
1.0000 mg | ORAL_TABLET | Freq: Every day | ORAL | Status: DC
  Administered 2018-09-02 – 2018-09-07 (×6): 1 mg via ORAL
  Filled 2018-09-01 (×6): qty 1

## 2018-09-01 MED ORDER — ONDANSETRON HCL 4 MG/2ML IJ SOLN: 4.0000 mg | Freq: Four times a day (QID) | INTRAMUSCULAR | Status: DC | PRN

## 2018-09-01 MED ORDER — CALCITRIOL 0.5 MCG PO CAPS
0.7500 ug | ORAL_CAPSULE | ORAL | Status: DC
  Administered 2018-09-02 – 2018-09-07 (×3): 0.75 ug via ORAL
  Filled 2018-09-01 (×2): qty 1

## 2018-09-01 MED ORDER — HYDROMORPHONE HCL 1 MG/ML IJ SOLN
0.5000 mg | INTRAMUSCULAR | Status: DC | PRN
  Administered 2018-09-01 – 2018-09-03 (×3): 0.5 mg via INTRAVENOUS
  Filled 2018-09-01 (×3): qty 0.5

## 2018-09-01 MED ORDER — DIVALPROEX SODIUM 250 MG PO DR TAB
500.0000 mg | DELAYED_RELEASE_TABLET | Freq: Two times a day (BID) | ORAL | Status: DC
  Administered 2018-09-01 – 2018-09-07 (×11): 500 mg via ORAL
  Filled 2018-09-01 (×11): qty 2

## 2018-09-01 MED ORDER — ASPIRIN EC 81 MG PO TBEC
81.0000 mg | DELAYED_RELEASE_TABLET | Freq: Every day | ORAL | Status: DC
  Administered 2018-09-02 – 2018-09-07 (×6): 81 mg via ORAL
  Filled 2018-09-01 (×6): qty 1

## 2018-09-01 MED ORDER — CHLORHEXIDINE GLUCONATE CLOTH 2 % EX PADS: 6.0000 | MEDICATED_PAD | Freq: Every day | CUTANEOUS | Status: DC

## 2018-09-01 MED ORDER — NITROGLYCERIN 2 % TD OINT
0.5000 [in_us] | TOPICAL_OINTMENT | Freq: Four times a day (QID) | TRANSDERMAL | Status: DC
  Administered 2018-09-01 – 2018-09-02 (×4): 0.5 [in_us] via TOPICAL
  Filled 2018-09-01: qty 30

## 2018-09-01 MED ORDER — NITROGLYCERIN 0.4 MG SL SUBL
0.4000 mg | SUBLINGUAL_TABLET | SUBLINGUAL | Status: DC | PRN
  Administered 2018-09-02 (×2): 0.4 mg via SUBLINGUAL
  Filled 2018-09-01: qty 1

## 2018-09-01 NOTE — H&P (View-Only) (Signed)
Cardiology Consultation:   Patient ID: Kenneth [name] MRN: [id number]; DOB: September 17, 1956  Admit date: 09/01/2018 Date of Consult: 09/01/2018  Primary Care Provider: [name], MD Primary Cardiologist: Peter [name], MD  Primary Electrophysiologist:  None   Patient Profile:   Kenneth [name] is a 62 y.o. AA male with a hx of cardiomyopathy w/ prior EF of 25-30%, felt to be NICM given low risk Myoview in 2017, EF has since normalized on subsequent echos, diastolic HF,  h/o heavy ETHO use,  NSVT, atrial fibrillation/flutter previously felt to be a poor candidate for anticoagulation due to poor compliance and ETHO use, ESRD on HD (Tu,Th,Sa) and HTN, who is being seen today for evaluation for CP and abnormal EKG, at the request of Dr. Laren Everts, Internal Medicine.   History of Present Illness:   Kenneth [name] is a 62 y.o. AA male with a hx of cardiomyopathy w/ prior EF of 25-30%, felt to be NICM given low risk Myoview in 2017, EF has since normalized on subsequent echos, diastolic HF,  h/o heavy ETHO use,  NSVT, atrial fibrillation/flutter previously felt to be a poor candidate for anticoagulation due to poor compliance and ETHO use, ESRD on HD (Tu,Th,Sa) and HTN, who is being seen today for evaluation for CP and abnormal EKG, at the request of Dr. Laren Everts, Internal Medicine.   His EF of 25-30% was noted in 2017. He had a repeat echo 12/02/17 that showed normalization of LVEF at 65-70%. In May of this year, he presented to the hospital with acute hypoxic respiratory failure due to pulmonary edema from noncompliance w/ HD. While in the ED, he suffered a PEA arrest. Per hospital notes, he received empiric treatment for hyperkalemia, shock x1 for WCT, and amiodarone. ROSC after 10 mins of ACLS followed by targeted temperature management. Following this he continued to be encephalopathic and had AFib with RVR, but ultimately reverted to NSR. He was not seen by cardiology that particular hospitalization.  Echo was obtained 03/09/18 and demonstrated severe concentric LVH, normal LVEF at 55-60%, G2DD, moderately decreased RV systolic function and thickened aortic and mitral valves. It was noted in echo report to consider evaluation for cardiac amyloidosis, including PYP scan, but I do not see that this ever took place. No outpatient cardiology appointments since 2018.   He now presents to the [facility] ED with CC of chest pain, nausea and SOB. EKG showed new TWIs in inferior lateral leads. BP elevated in the 025K systolic.  CP improved after SL NTG. Initial POC troponin negative x 1. Serial lab troponin's ordered. BMP w/ Scr at 10.82 (getting HD today). CXR unremarkable. Repeat echo pending.   Pt reports that he is currently CP free. He notes intermitted left sided chest pain over the last several days. Described as a pressure like discomfort w/ occasional radiation to the left arm. Episodes typically last ~10 in duration. Not articularly worse with exertion. Sometimes brought on after meals. Symptoms improved after vomiting. Some relief w/ SL NTG in the ED. No recurrence since initial presentation.    Past Medical History:  Diagnosis Date  . Acute CHF ([place]) 01/2018  . Cardiomyopathy secondary    likely related to HTN heart disease; possibly ETOH related as well  . Chronic combined systolic and diastolic heart failure (HCC)    Echocardiogram 09/22/11: Moderate LVH, EF 27-06%, grade 3 diastolic dysfunction, mild MR, moderate to severe LAE, mild RVE, mild to moderate TR, small to moderate pericardial effusion  . ESRD (end  stage renal disease) on dialysis [facility])    due to hypertensive nephrosclerosis  . History of alcohol abuse   . Hypertension     Past Surgical History:  Procedure Laterality Date  . AV FISTULA PLACEMENT Left 05/14/2016   Procedure: LEFT ARM BASILIC VEIN TRANSPOSITION;  Surgeon: Rosetta Posner, MD;  Location: [place];  Service: Vascular;  Laterality: Left;  . PERIPHERAL VASCULAR CATHETERIZATION  N/A 05/13/2016   Procedure: Dialysis/Perma Catheter Insertion;  Surgeon: Serafina Mitchell, MD;  Location: [place] CV LAB;  Service: Cardiovascular;  Laterality: N/A;     Home Medications:  Prior to Admission medications   Medication Sig Start Date End Date Taking? Authorizing Provider  divalproex (DEPAKOTE) 500 MG DR tablet Take 1 tablet (500 mg total) by mouth 2 (two) times daily. 03/27/18 08/11/26 Yes Dessa Phi, DO  folic acid (FOLVITE) 1 MG tablet Take 1 tablet (1 mg total) by mouth daily. Patient not taking: Reported on 05/11/2018 01/28/18   Cristal Ford, DO  Multiple Vitamin (MULTIVITAMIN WITH MINERALS) TABS tablet Take 1 tablet by mouth daily. Patient not taking: Reported on 05/11/2018 01/28/18   Cristal Ford, DO  nitroGLYCERIN (NITROSTAT) 0.4 MG SL tablet Place 1 tablet (0.4 mg total) under the tongue every 5 (five) minutes as needed for chest pain. Patient not taking: Reported on 29/02/1883 16/60/63   Delora Fuel, MD  thiamine 100 MG tablet Take 1 tablet (100 mg total) by mouth daily. Patient not taking: Reported on 05/11/2018 01/28/18   Cristal Ford, DO    Kenneth Medications: Scheduled Meds:  Continuous Infusions:  PRN Meds:  Allergies:   No Known Allergies  Social History:   Social History   Socioeconomic History  . Marital status: Single    Spouse name: Not on file  . Number of children: Not on file  . Years of education: Not on file  . Highest education level: Not on file  Occupational History  . Not on file  Social Needs  . Financial resource strain: Not on file  . Food insecurity:    Worry: Not on file    Inability: Not on file  . Transportation needs:    Medical: Not on file    Non-medical: Not on file  Tobacco Use  . Smoking status: Former Smoker    Types: Cigars    Last attempt to quit: 10/27/2017    Years since quitting: 0.8  . Smokeless tobacco: Former Network engineer and Sexual Activity  . Alcohol use: Yes    Alcohol/week: 30.0  standard drinks    Types: 30 Cans of beer per week    Frequency: Never    Comment: drinks daily  . Drug use: No  . Sexual activity: Not on file  Lifestyle  . Physical activity:    Days per week: Not on file    Minutes per session: Not on file  . Stress: Not on file  Relationships  . Social connections:    Talks on phone: Not on file    Gets together: Not on file    Attends religious service: Not on file    Active member of club or organization: Not on file    Attends meetings of clubs or organizations: Not on file    Relationship status: Not on file  . Intimate partner violence:    Fear of current or ex partner: Not on file    Emotionally abused: Not on file    Physically abused: Not on file  Forced sexual activity: Not on file  Other Topics Concern  . Not on file  Social History Narrative  . Not on file    Family History:    Family History  Problem Relation Age of Onset  . Emphysema Mother   . Cirrhosis Father     ROS:  Please see the history of present illness.   All other ROS reviewed and negative.     Physical Exam/Data:   Vitals:   09/01/18 1115 09/01/18 1130 09/01/18 1145 09/01/18 1200  BP: (!) 156/76 (!) 149/84 (!) 153/84 (!) 170/81  Pulse: 71 71 73 75  Resp: 13 12 13 16   Temp:      TempSrc:      SpO2: 95% 97% 97% 97%   No intake or output data in the 24 hours ending 09/01/18 1242 There were no vitals filed for this visit. There is no height or weight on file to calculate BMI.  General:  Well nourished, well developed, in no acute distress HEENT: normal Lymph: no adenopathy Neck: no JVD Endocrine:  No thryomegaly Vascular: No carotid bruits; FA pulses 2+ bilaterally without bruits  Cardiac:  normal S1, S2; RRR; no murmur  Lungs:  clear to auscultation bilaterally, no wheezing, rhonchi or rales  Abd: soft, nontender, no hepatomegaly  Ext: no edema Musculoskeletal:  No deformities, BUE and BLE strength normal and equal Skin: warm and dry    Neuro:  CNs 2-12 intact, no focal abnormalities noted Psych:  Normal affect   EKG:  The EKG was personally reviewed and demonstrates:  SR w/ new inferior lateral TWIs Telemetry:  Telemetry was personally reviewed and demonstrates:  NSR   Relevant CV Studies: 2D Echo 03/09/18  Study Conclusions  - Left ventricle: The cavity size was normal. There was severe   concentric hypertrophy. Systolic function was normal. Wall motion   was normal; there were no regional wall motion abnormalities.   Features are consistent with a pseudonormal left ventricular   filling pattern, with concomitant abnormal relaxation and   increased filling pressure (grade 2 diastolic dysfunction).   Doppler parameters are consistent with elevated ventricular   end-diastolic filling pressure. - Aortic valve: Probably trileaflet; moderately thickened,   moderately calcified leaflets. - Mitral valve: Calcified annulus. Moderately thickened, mildly   calcified leaflets . There was mild regurgitation. - Left atrium: The atrium was mildly dilated. - Right ventricle: The cavity size was moderately dilated. Wall   thickness was normal. Systolic function was moderately reduced. - Right atrium: The atrium was normal in size. - Tricuspid valve: There was mild regurgitation. - Pulmonary arteries: Systolic pressure was within the normal   range. - Inferior vena cava: The vessel was normal in size. - Pericardium, extracardiac: There was no pericardial effusion.  Impressions:  - Severe concentric LVH, LVEF 55-60%. Grade 2 diastolic   dysfunction.   Moderately decreased RV systolic function.   Thickened aortic and mitral valves.   Consider evaluation for cardiac amyloidosis including PYP scan   (nuclear scan for evaluation of TTR amyloidosis).   Laboratory Data:  Chemistry Recent Labs  Lab 09/01/18 1043 09/01/18 1050  NA 138 138  K 4.0 4.0  CL 94* 95*  CO2 29  --   GLUCOSE 88 87  BUN 60* 55*  CREATININE  10.82* 12.00*  CALCIUM 8.5*  --   GFRNONAA 4*  --   GFRAA 5*  --   ANIONGAP 15  --     No results for input(s): PROT, ALBUMIN,  AST, ALT, ALKPHOS, BILITOT in the last 168 hours. Hematology Recent Labs  Lab 09/01/18 1043 09/01/18 1050  WBC 7.2  --   RBC 3.33*  --   HGB 10.9* 10.9*  HCT 32.6* 32.0*  MCV 97.9  --   MCH 32.7  --   MCHC 33.4  --   RDW 14.4  --   PLT 218  --    Cardiac EnzymesNo results for input(s): TROPONINI in the last 168 hours.  Recent Labs  Lab 09/01/18 1048  TROPIPOC 0.04    BNPNo results for input(s): BNP, PROBNP in the last 168 hours.  DDimer No results for input(s): DDIMER in the last 168 hours.  Lipid Panel  No results found for: CHOL, TRIG, HDL, CHOLHDL, VLDL, LDLCALC, LDLDIRECT  Radiology/Studies:  Dg Chest Port 1 View  Result Date: 09/01/2018 CLINICAL DATA:  Left-sided chest pain. EXAM: PORTABLE CHEST 1 VIEW COMPARISON:  08/11/2018 and 08/10/2018 FINDINGS: Heart size and pulmonary vascularity are normal the lungs are clear. Calcification and slight tortuosity of the thoracic aorta. No appreciable effusions. No significant bone abnormality. IMPRESSION: No acute abnormality. Aortic Atherosclerosis (ICD10-I70.0). Electronically Signed   By: Lorriane Shire M.D.   On: 09/01/2018 11:05    Assessment and Plan:   Kenneth [name] is a 62 y.o. AA male with a hx of cardiomyopathy w/ prior EF of 25-30%, felt to be NICM given low risk Myoview in 2017, EF has since normalized on subsequent echos, diastolic HF,  h/o heavy ETHO use,  NSVT, atrial fibrillation/flutter previously felt to be a poor candidate for anticoagulation due to poor compliance and ETHO use, ESRD on HD (Tu,Th,Sa) and HTN, who is being seen today for evaluation for CP and abnormal EKG, at the request of Dr. Laren Everts, Internal Medicine.    1. Chest Pain: mixed typical and atypical features. He notes recent intermitted left sided pressure like discomfort with occasional radiation to the left arm,  ~10 min duration. Atypical in that pain is not any worse with exertion, some relationship w/ meals and also improved after vomiting. He has had some relief however w/ SL NTG and also abnormal EKG showing new inferior and lateral TWIs. 2D echo pending. He had a stress test in 2017 that was negative for ischemia. He is currently CP free and initial POC troponin negative x 1. Recommend cycling cardiac enzymes x 3. If he rules in or if echo shows reduced LVF or WMAs, plan on cardiac cath. If negative, may consider coronary CTA vs repeat nuclear stress test. MD to follow with further recommendations.     For questions or updates, please contact [place] Please consult [email or web address] for contact info under    Patient seen and examined. Agree with assessment and plan.  Kenneth [name] is a 62 year old African-American male who has a long-standing history of tobacco use, starting at age 69 and quitting approximately 6 months ago (49 years).  In 2017 he was found to have reduced EF at 25 to 30% felt to be due to a nonischemic cardia myopathy and had a normal low risk Myoview study.  EF is subsequently improved and he was felt to have diastolic heart failure.  There is a history of significant EtOH use in the past which he states he does not drink presently.  He also has a history  NSVT, atrial fibrillation/flutter and has significant hypertensive history leading to end-stage renal disease for which she has been on dialysis for approximately 8 months.  Recently, he  is experienced chest pain with some atypical and typical features.  He states often his pain is precipitated by drinking water or swallowing foods.  It often gets improved when he vomits.  However at other times he does note a chest discomfort when he walks but he can also experience this while at rest.  An echo Doppler study was done today which now confirms to need preserved LV function with EF at 55 to 60%.  There appears to be only very mild  LVH with wall thickness at 13 mm.  He did not have regional wall motion abnormalities.  There was only mild grade 1 diastolic dysfunction.  There was evidence for mild MR, and mild LA dilation.  There was a suggestion of a possible small patent foramen ovale.  Presently, he is pain-free.  She will blood pressure was elevated at 170/81.  Pulse was in the 70s and regular without ectopy.  HEENT was unremarkable.  I do not hear carotid bruits.  Breath sounds are without rales or wheezes.  Rhythm was regular with a faint 1/6 systolic murmur.  Abdomen was soft and nontender.  Was no edema.  Neurologic exam was grossly nonfocal.  ECG personally reviewed by me reveals sinus rhythm at 78 with nondiagnostic inferolateral T wave changes.  Troponin is neuro 0.3.  BMP is mildly elevated at 678.  Creatinine 12.0, on dialysis.  He is mildly anemic with hemoglobin of 10.9 hematocrit 32.0.  Chest x-ray did not reveal any acute abnormality.  I suspect the patient's chest pain is nonischemic in etiology.  However he does have risk factors including tobacco history for 48 years, hypertension end-stage renal disease.  He is on dialysis on Tuesday Thursdays and Saturdays.  I would recommend CT coronary angios to evaluate anatomical coronary plaque.  Recommend fasting lipid panel in a.m. with target LDL less than 70.  Consider GI evaluation if he is not found to have significant coronary obstructive disease in light of difficulty with swallowing and improvement in symptoms following vomiting.  Will follow with you.   Troy Sine, MD, [facility] 09/01/2018 5:09 PM    Signed, Lyda Jester, PA-C  09/01/2018 12:42 PM

## 2018-09-01 NOTE — Consult Note (Signed)
Reason for Consult: To manage dialysis and dialysis related needs Referring Physician: Dr. Bonnye Fava Kenneth [name] is an 62 y.o. male.  HPI: 51AAM with ESRD on HD TTS at [facility], HTN, h/o EtOH abuse, combined heart failure who presented with chest pain.  He is unable to provide much of a history when I talked to him bedside, but does note that he feels ok and is chest pain free.  Per notes had CP, nausea, dyspnea.  EKG with inferolateral ST depressions.  He is being admitted for chest pain evaluation.  Cardiology has been consulted.     Past Medical History:  Diagnosis Date  . Acute CHF ([place]) 01/2018  . Cardiomyopathy secondary    likely related to HTN heart disease; possibly ETOH related as well  . Chronic combined systolic and diastolic heart failure (HCC)    Echocardiogram 09/22/11: Moderate LVH, EF 38-18%, grade 3 diastolic dysfunction, mild MR, moderate to severe LAE, mild RVE, mild to moderate TR, small to moderate pericardial effusion  . ESRD (end stage renal disease) on dialysis [facility])    due to hypertensive nephrosclerosis  . History of alcohol abuse   . Hypertension     Past Surgical History:  Procedure Laterality Date  . AV FISTULA PLACEMENT Left 05/14/2016   Procedure: LEFT ARM BASILIC VEIN TRANSPOSITION;  Surgeon: Rosetta Posner, MD;  Location: [place];  Service: Vascular;  Laterality: Left;  . PERIPHERAL VASCULAR CATHETERIZATION N/A 05/13/2016   Procedure: Dialysis/Perma Catheter Insertion;  Surgeon: Serafina Mitchell, MD;  Location: [place] CV LAB;  Service: Cardiovascular;  Laterality: N/A;    Family History  Problem Relation Age of Onset  . Emphysema Mother   . Cirrhosis Father     Social History:  reports that he quit smoking about 10 months ago. His smoking use included cigars. He has quit using smokeless tobacco. He reports that he drinks about 30.0 standard drinks of alcohol per week. He reports that he does not use drugs.  Allergies: No Known  Allergies  Medications: I have reviewed the patient's current medications.   Results for orders placed or performed during the hospital encounter of 09/01/18 (from the past 48 hour(s))  Basic metabolic panel     Status: Abnormal   Collection Time: 09/01/18 10:43 AM  Result Value Ref Range   Sodium 138 135 - 145 mmol/L   Potassium 4.0 3.5 - 5.1 mmol/L   Chloride 94 (L) 98 - 111 mmol/L   CO2 29 22 - 32 mmol/L   Glucose, Bld 88 70 - 99 mg/dL   BUN 60 (H) 8 - 23 mg/dL   Creatinine, Ser 10.82 (H) 0.61 - 1.24 mg/dL   Calcium 8.5 (L) 8.9 - 10.3 mg/dL   GFR calc non Af Amer 4 (L) >60 mL/min   GFR calc Af Amer 5 (L) >60 mL/min    Comment: (NOTE) The eGFR has been calculated using the CKD EPI equation. This calculation has not been validated in all clinical situations. eGFR's persistently <60 mL/min signify possible Chronic Kidney Disease.    Anion gap 15 5 - 15    Comment: Performed at [facility] [address] [place] [address]  CBC     Status: Abnormal   Collection Time: 09/01/18 10:43 AM  Result Value Ref Range   WBC 7.2 4.0 - 10.5 K/uL   RBC 3.33 (L) 4.22 - 5.81 MIL/uL   Hemoglobin 10.9 (L) 13.0 - 17.0 g/dL   HCT 32.6 (L)  39.0 - 52.0 %   MCV 97.9 80.0 - 100.0 fL   MCH 32.7 26.0 - 34.0 pg   MCHC 33.4 30.0 - 36.0 g/dL   RDW 14.4 11.5 - 15.5 %   Platelets 218 150 - 400 K/uL   nRBC 0.0 0.0 - 0.2 %    Comment: Performed at [facility], [place] [address], [place], [place] [address]  I-stat troponin, ED     Status: None   Collection Time: 09/01/18 10:48 AM  Result Value Ref Range   Troponin i, poc 0.04 0.00 - 0.08 ng/mL   Comment 3            Comment: Due to the release kinetics of cTnI, a negative result within the first hours of the onset of symptoms does not rule out myocardial infarction with certainty. If myocardial infarction is still suspected, repeat the test at appropriate intervals.   I-stat Chem 8, ED     Status: Abnormal   Collection Time:  09/01/18 10:50 AM  Result Value Ref Range   Sodium 138 135 - 145 mmol/L   Potassium 4.0 3.5 - 5.1 mmol/L   Chloride 95 (L) 98 - 111 mmol/L   BUN 55 (H) 8 - 23 mg/dL   Creatinine, Ser 12.00 (H) 0.61 - 1.24 mg/dL   Glucose, Bld 87 70 - 99 mg/dL   Calcium, Ion 0.94 (L) 1.15 - 1.40 mmol/L   TCO2 33 (H) 22 - 32 mmol/L   Hemoglobin 10.9 (L) 13.0 - 17.0 g/dL   HCT 32.0 (L) 39.0 - 52.0 %  Troponin I     Status: Abnormal   Collection Time: 09/01/18  2:06 PM  Result Value Ref Range   Troponin I 0.03 (HH) <0.03 ng/mL    Comment: CRITICAL RESULT CALLED TO, READ BACK BY AND VERIFIED WITH: Stephens Shire RN [phone] 1600 GRE[address] R Performed at [facility], [place] [address] [place], [place] [address]     Dg Chest Port 1 View  Result Date: 09/01/2018 CLINICAL DATA:  Left-sided chest pain. EXAM: PORTABLE CHEST 1 VIEW COMPARISON:  08/11/2018 and 08/10/2018 FINDINGS: Heart size and pulmonary vascularity are normal the lungs are clear. Calcification and slight tortuosity of the thoracic aorta. No appreciable effusions. No significant bone abnormality. IMPRESSION: No acute abnormality. Aortic Atherosclerosis (ICD10-I70.0). Electronically Signed   By: Lorriane Shire M.D.   On: 09/01/2018 11:05    ROS: 12 point ROS neg except per HPI listed above Blood pressure (!) 153/77, pulse 81, temperature 97.7 F (36.5 C), temperature source Oral, resp. rate 16, SpO2 100 %. Gen: comfortably lying flat in bed ENT: MMM Eyes: anicteric CV: RRR, II/VI syst murmur Lungs: clear to bases Abd: soft Extr: no edema, LUE AVF aneurysms soft, +t/b Neuro: doesn't know details of medical history, otherwise nonfocal  Outpt HD orders: [facility] TTS 4.25hrs 180, 400/800, 2/2, 86.5 Last HD 11/5 - pre wt 96kg, UF 3.4, post weight 92.6 ran full tx. Post weights over past month - lowest was 88.8kg  Assessment/Plan: 1 Chest pain: per primary, cycling troponins, cardiology consulting 2 ESRD: HD per schedule planned for tomorrow.  Will  most likely need EDW adjusted.  3. Anemia of ESRD: last hb 10.7, no ESA outpatient.   4. Metabolic Bone Disease: corr ca running low - dec sensipar from 90 to 60s, 2.5ca dialysate, continue calcitriol 0.75 per outpt.  Phos runs in the 7-8s - renal diet here, fosrenol 2g TID per outpatient dose.  Justin Mend  09/01/2018, 4:19 PM

## 2018-09-01 NOTE — Consult Note (Addendum)
Cardiology Consultation:   Patient ID: Kenneth [name] MRN: [id number]; DOB: Jun 03, 1956  Admit date: 09/01/2018 Date of Consult: 09/01/2018  Primary Care Provider: [name], MD Primary Cardiologist: Peter [name], MD  Primary Electrophysiologist:  None   Patient Profile:   Kenneth [name] is a 62 y.o. AA male with a hx of cardiomyopathy w/ prior EF of 25-30%, felt to be NICM given low risk Myoview in 2017, EF has since normalized on subsequent echos, diastolic HF,  h/o heavy ETHO use,  NSVT, atrial fibrillation/flutter previously felt to be a poor candidate for anticoagulation due to poor compliance and ETHO use, ESRD on HD (Tu,Th,Sa) and HTN, who is being seen today for evaluation for CP and abnormal EKG, at the request of Dr. Laren Everts, Internal Medicine.   History of Present Illness:   Kenneth [name] is a 62 y.o. AA male with a hx of cardiomyopathy w/ prior EF of 25-30%, felt to be NICM given low risk Myoview in 2017, EF has since normalized on subsequent echos, diastolic HF,  h/o heavy ETHO use,  NSVT, atrial fibrillation/flutter previously felt to be a poor candidate for anticoagulation due to poor compliance and ETHO use, ESRD on HD (Tu,Th,Sa) and HTN, who is being seen today for evaluation for CP and abnormal EKG, at the request of Dr. Laren Everts, Internal Medicine.   His EF of 25-30% was noted in 2017. He had a repeat echo 12/02/17 that showed normalization of LVEF at 65-70%. In May of this year, he presented to the hospital with acute hypoxic respiratory failure due to pulmonary edema from noncompliance w/ HD. While in the ED, he suffered a PEA arrest. Per hospital notes, he received empiric treatment for hyperkalemia, shock x1 for WCT, and amiodarone. ROSC after 10 mins of ACLS followed by targeted temperature management. Following this he continued to be encephalopathic and had AFib with RVR, but ultimately reverted to NSR. He was not seen by cardiology that particular hospitalization.  Echo was obtained 03/09/18 and demonstrated severe concentric LVH, normal LVEF at 55-60%, G2DD, moderately decreased RV systolic function and thickened aortic and mitral valves. It was noted in echo report to consider evaluation for cardiac amyloidosis, including PYP scan, but I do not see that this ever took place. No outpatient cardiology appointments since 2018.   He now presents to the [facility] ED with CC of chest pain, nausea and SOB. EKG showed new TWIs in inferior lateral leads. BP elevated in the 315Q systolic.  CP improved after SL NTG. Initial POC troponin negative x 1. Serial lab troponin's ordered. BMP w/ Scr at 10.82 (getting HD today). CXR unremarkable. Repeat echo pending.   Pt reports that he is currently CP free. He notes intermitted left sided chest pain over the last several days. Described as a pressure like discomfort w/ occasional radiation to the left arm. Episodes typically last ~10 in duration. Not articularly worse with exertion. Sometimes brought on after meals. Symptoms improved after vomiting. Some relief w/ SL NTG in the ED. No recurrence since initial presentation.    Past Medical History:  Diagnosis Date  . Acute CHF ([place]) 01/2018  . Cardiomyopathy secondary    likely related to HTN heart disease; possibly ETOH related as well  . Chronic combined systolic and diastolic heart failure (HCC)    Echocardiogram 09/22/11: Moderate LVH, EF 00-86%, grade 3 diastolic dysfunction, mild MR, moderate to severe LAE, mild RVE, mild to moderate TR, small to moderate pericardial effusion  . ESRD (end  stage renal disease) on dialysis [facility])    due to hypertensive nephrosclerosis  . History of alcohol abuse   . Hypertension     Past Surgical History:  Procedure Laterality Date  . AV FISTULA PLACEMENT Left 05/14/2016   Procedure: LEFT ARM BASILIC VEIN TRANSPOSITION;  Surgeon: Rosetta Posner, MD;  Location: [facility];  Service: Vascular;  Laterality: Left;  . PERIPHERAL VASCULAR CATHETERIZATION  N/A 05/13/2016   Procedure: Dialysis/Perma Catheter Insertion;  Surgeon: Serafina Mitchell, MD;  Location: [facility];  Service: Cardiovascular;  Laterality: N/A;     Home Medications:  Prior to Admission medications   Medication Sig Start Date End Date Taking? Authorizing Provider  divalproex (DEPAKOTE) 500 MG DR tablet Take 1 tablet (500 mg total) by mouth 2 (two) times daily. 03/27/18 08/11/26 Yes Dessa Phi, DO  folic acid (FOLVITE) 1 MG tablet Take 1 tablet (1 mg total) by mouth daily. Patient not taking: Reported on 05/11/2018 01/28/18   Cristal Ford, DO  Multiple Vitamin (MULTIVITAMIN WITH MINERALS) TABS tablet Take 1 tablet by mouth daily. Patient not taking: Reported on 05/11/2018 01/28/18   Cristal Ford, DO  nitroGLYCERIN (NITROSTAT) 0.4 MG SL tablet Place 1 tablet (0.4 mg total) under the tongue every 5 (five) minutes as needed for chest pain. Patient not taking: Reported on 32/11/252 27/06/23   Delora Fuel, MD  thiamine 100 MG tablet Take 1 tablet (100 mg total) by mouth daily. Patient not taking: Reported on 05/11/2018 01/28/18   Cristal Ford, DO    Inpatient Medications: Scheduled Meds:  Continuous Infusions:  PRN Meds:  Allergies:   No Known Allergies  Social History:   Social History   Socioeconomic History  . Marital status: Single    Spouse name: Not on file  . Number of children: Not on file  . Years of education: Not on file  . Highest education level: Not on file  Occupational History  . Not on file  Social Needs  . Financial resource strain: Not on file  . Food insecurity:    Worry: Not on file    Inability: Not on file  . Transportation needs:    Medical: Not on file    Non-medical: Not on file  Tobacco Use  . Smoking status: Former Smoker    Types: Cigars    Last attempt to quit: 10/27/2017    Years since quitting: 0.8  . Smokeless tobacco: Former Network engineer and Sexual Activity  . Alcohol use: Yes    Alcohol/week: 30.0  standard drinks    Types: 30 Cans of beer per week    Frequency: Never    Comment: drinks daily  . Drug use: No  . Sexual activity: Not on file  Lifestyle  . Physical activity:    Days per week: Not on file    Minutes per session: Not on file  . Stress: Not on file  Relationships  . Social connections:    Talks on phone: Not on file    Gets together: Not on file    Attends religious service: Not on file    Active member of club or organization: Not on file    Attends meetings of clubs or organizations: Not on file    Relationship status: Not on file  . Intimate partner violence:    Fear of current or ex partner: Not on file    Emotionally abused: Not on file    Physically abused: Not on file  Forced sexual activity: Not on file  Other Topics Concern  . Not on file  Social History Narrative  . Not on file    Family History:    Family History  Problem Relation Age of Onset  . Emphysema Mother   . Cirrhosis Father     ROS:  Please see the history of present illness.   All other ROS reviewed and negative.     Physical Exam/Data:   Vitals:   09/01/18 1115 09/01/18 1130 09/01/18 1145 09/01/18 1200  BP: (!) 156/76 (!) 149/84 (!) 153/84 (!) 170/81  Pulse: 71 71 73 75  Resp: 13 12 13 16   Temp:      TempSrc:      SpO2: 95% 97% 97% 97%   No intake or output data in the 24 hours ending 09/01/18 1242 There were no vitals filed for this visit. There is no height or weight on file to calculate BMI.  General:  Well nourished, well developed, in no acute distress HEENT: normal Lymph: no adenopathy Neck: no JVD Endocrine:  No thryomegaly Vascular: No carotid bruits; FA pulses 2+ bilaterally without bruits  Cardiac:  normal S1, S2; RRR; no murmur  Lungs:  clear to auscultation bilaterally, no wheezing, rhonchi or rales  Abd: soft, nontender, no hepatomegaly  Ext: no edema Musculoskeletal:  No deformities, BUE and BLE strength normal and equal Skin: warm and dry    Neuro:  CNs 2-12 intact, no focal abnormalities noted Psych:  Normal affect   EKG:  The EKG was personally reviewed and demonstrates:  SR w/ new inferior lateral TWIs Telemetry:  Telemetry was personally reviewed and demonstrates:  NSR   Relevant CV Studies: 2D Echo 03/09/18  Study Conclusions  - Left ventricle: The cavity size was normal. There was severe   concentric hypertrophy. Systolic function was normal. Wall motion   was normal; there were no regional wall motion abnormalities.   Features are consistent with a pseudonormal left ventricular   filling pattern, with concomitant abnormal relaxation and   increased filling pressure (grade 2 diastolic dysfunction).   Doppler parameters are consistent with elevated ventricular   end-diastolic filling pressure. - Aortic valve: Probably trileaflet; moderately thickened,   moderately calcified leaflets. - Mitral valve: Calcified annulus. Moderately thickened, mildly   calcified leaflets . There was mild regurgitation. - Left atrium: The atrium was mildly dilated. - Right ventricle: The cavity size was moderately dilated. Wall   thickness was normal. Systolic function was moderately reduced. - Right atrium: The atrium was normal in size. - Tricuspid valve: There was mild regurgitation. - Pulmonary arteries: Systolic pressure was within the normal   range. - Inferior vena cava: The vessel was normal in size. - Pericardium, extracardiac: There was no pericardial effusion.  Impressions:  - Severe concentric LVH, LVEF 55-60%. Grade 2 diastolic   dysfunction.   Moderately decreased RV systolic function.   Thickened aortic and mitral valves.   Consider evaluation for cardiac amyloidosis including PYP scan   (nuclear scan for evaluation of TTR amyloidosis).   Laboratory Data:  Chemistry Recent Labs  Lab 09/01/18 1043 09/01/18 1050  NA 138 138  K 4.0 4.0  CL 94* 95*  CO2 29  --   GLUCOSE 88 87  BUN 60* 55*  CREATININE  10.82* 12.00*  CALCIUM 8.5*  --   GFRNONAA 4*  --   GFRAA 5*  --   ANIONGAP 15  --     No results for input(s): PROT, ALBUMIN,  AST, ALT, ALKPHOS, BILITOT in the last 168 hours. Hematology Recent Labs  Lab 09/01/18 1043 09/01/18 1050  WBC 7.2  --   RBC 3.33*  --   HGB 10.9* 10.9*  HCT 32.6* 32.0*  MCV 97.9  --   MCH 32.7  --   MCHC 33.4  --   RDW 14.4  --   PLT 218  --    Cardiac EnzymesNo results for input(s): TROPONINI in the last 168 hours.  Recent Labs  Lab 09/01/18 1048  TROPIPOC 0.04    BNPNo results for input(s): BNP, PROBNP in the last 168 hours.  DDimer No results for input(s): DDIMER in the last 168 hours.  Lipid Panel  No results found for: CHOL, TRIG, HDL, CHOLHDL, VLDL, LDLCALC, LDLDIRECT  Radiology/Studies:  Dg Chest Port 1 View  Result Date: 09/01/2018 CLINICAL DATA:  Left-sided chest pain. EXAM: PORTABLE CHEST 1 VIEW COMPARISON:  08/11/2018 and 08/10/2018 FINDINGS: Heart size and pulmonary vascularity are normal the lungs are clear. Calcification and slight tortuosity of the thoracic aorta. No appreciable effusions. No significant bone abnormality. IMPRESSION: No acute abnormality. Aortic Atherosclerosis (ICD10-I70.0). Electronically Signed   By: Lorriane Shire M.D.   On: 09/01/2018 11:05    Assessment and Plan:   Alford Gamero is a 62 y.o. AA male with a hx of cardiomyopathy w/ prior EF of 25-30%, felt to be NICM given low risk Myoview in 2017, EF has since normalized on subsequent echos, diastolic HF,  h/o heavy ETHO use,  NSVT, atrial fibrillation/flutter previously felt to be a poor candidate for anticoagulation due to poor compliance and ETHO use, ESRD on HD (Tu,Th,Sa) and HTN, who is being seen today for evaluation for CP and abnormal EKG, at the request of Dr. Laren Everts, Internal Medicine.    1. Chest Pain: mixed typical and atypical features. He notes recent intermitted left sided pressure like discomfort with occasional radiation to the left arm,  ~10 min duration. Atypical in that pain is not any worse with exertion, some relationship w/ meals and also improved after vomiting. He has had some relief however w/ SL NTG and also abnormal EKG showing new inferior and lateral TWIs. 2D echo pending. He had a stress test in 2017 that was negative for ischemia. He is currently CP free and initial POC troponin negative x 1. Recommend cycling cardiac enzymes x 3. If he rules in or if echo shows reduced LVF or WMAs, plan on cardiac cath. If negative, may consider coronary CTA vs repeat nuclear stress test. MD to follow with further recommendations.     For questions or updates, please contact Drew Please consult [email or web address] for contact info under    Patient seen and examined. Agree with assessment and plan.  Mr. Josip Merolla is a 62 year old African-American male who has a long-standing history of tobacco use, starting at age 62 and quitting approximately 6 months ago (43 years).  In 2017 he was found to have reduced EF at 25 to 30% felt to be due to a nonischemic cardia myopathy and had a normal low risk Myoview study.  EF is subsequently improved and he was felt to have diastolic heart failure.  There is a history of significant EtOH use in the past which he states he does not drink presently.  He also has a history  NSVT, atrial fibrillation/flutter and has significant hypertensive history leading to end-stage renal disease for which she has been on dialysis for approximately 8 months.  Recently, he  is experienced chest pain with some atypical and typical features.  He states often his pain is precipitated by drinking water or swallowing foods.  It often gets improved when he vomits.  However at other times he does note a chest discomfort when he walks but he can also experience this while at rest.  An echo Doppler study was done today which now confirms to need preserved LV function with EF at 55 to 60%.  There appears to be only very mild  LVH with wall thickness at 13 mm.  He did not have regional wall motion abnormalities.  There was only mild grade 1 diastolic dysfunction.  There was evidence for mild MR, and mild LA dilation.  There was a suggestion of a possible small patent foramen ovale.  Presently, he is pain-free.  She will blood pressure was elevated at 170/81.  Pulse was in the 70s and regular without ectopy.  HEENT was unremarkable.  I do not hear carotid bruits.  Breath sounds are without rales or wheezes.  Rhythm was regular with a faint 1/6 systolic murmur.  Abdomen was soft and nontender.  Was no edema.  Neurologic exam was grossly nonfocal.  ECG personally reviewed by me reveals sinus rhythm at 78 with nondiagnostic inferolateral T wave changes.  Troponin is neuro 0.3.  BMP is mildly elevated at 678.  Creatinine 12.0, on dialysis.  He is mildly anemic with hemoglobin of 10.9 hematocrit 32.0.  Chest x-ray did not reveal any acute abnormality.  I suspect the patient's chest pain is nonischemic in etiology.  However he does have risk factors including tobacco history for 48 years, hypertension end-stage renal disease.  He is on dialysis on Tuesday Thursdays and Saturdays.  I would recommend CT coronary angios to evaluate anatomical coronary plaque.  Recommend fasting lipid panel in a.m. with target LDL less than 70.  Consider GI evaluation if he is not found to have significant coronary obstructive disease in light of difficulty with swallowing and improvement in symptoms following vomiting.  Will follow with you.   Troy Sine, MD, [facility] 09/01/2018 5:09 PM    Signed, Lyda Jester, PA-C  09/01/2018 12:42 PM

## 2018-09-01 NOTE — ED Triage Notes (Signed)
Per [facility]: Woke up with 10/10 chest pain, around 8 am. Ate some pancakes and spicy sausage, then vomited still had pain. 12 lead showed ST depression in V5 and V6, gave 1 nitro and 324 ASA, then the ST depression went away. 150/90 nitro given then BP went to 123/65 Pt is no pain free.   HX HTN - had dialysis, full treatment  Dialysis left arm restricted.

## 2018-09-01 NOTE — Progress Notes (Signed)
  Echocardiogram 2D Echocardiogram has been performed.  Jennette Dubin 09/01/2018, 3:25 PM

## 2018-09-01 NOTE — ED Notes (Signed)
Pt states that "I don't drink anymore". Denies alcohol use at this time.

## 2018-09-01 NOTE — H&P (Signed)
[facility]                                                                                    Patient Demographics  Kenneth [name], is a 62 y.o. male  CSN: [id number]  MRN: [id number]  DOB - 1956/08/22  Admit Date - 09/01/2018  Outpatient Primary MD for the patient is [name], MD   With History of -  Past Medical History:  Diagnosis Date  . Acute CHF ([place]) 01/2018  . Cardiomyopathy secondary    likely related to HTN heart disease; possibly ETOH related as well  . Chronic combined systolic and diastolic heart failure (HCC)    Echocardiogram 09/22/11: Moderate LVH, EF 38-75%, grade 3 diastolic dysfunction, mild MR, moderate to severe LAE, mild RVE, mild to moderate TR, small to moderate pericardial effusion  . ESRD (end stage renal disease) on dialysis [facility])    due to hypertensive nephrosclerosis  . History of alcohol abuse   . Hypertension       Past Surgical History:  Procedure Laterality Date  . AV FISTULA PLACEMENT Left 05/14/2016   Procedure: LEFT ARM BASILIC VEIN TRANSPOSITION;  Surgeon: Rosetta Posner, MD;  Location: [place];  Service: Vascular;  Laterality: Left;  . PERIPHERAL VASCULAR CATHETERIZATION N/A 05/13/2016   Procedure: Dialysis/Perma Catheter Insertion;  Surgeon: Serafina Mitchell, MD;  Location: [place] CV LAB;  Service: Cardiovascular;  Laterality: N/A;    in for   Chief Complaint  Patient presents with  . Chest Pain     HPI  Kenneth [name]  is a 62 y.o. male, with past medical history significant for end-stage renal disease on hemodialysis, hypertension, alcoholism and combined systolic and diastolic congestive heart failure with grade 2 diastolic dysfunction on last echo done in 2019 presenting today with chest pain with nausea and shortness of breath that started after breakfast at 8 AM.  The patient was seen by EMS squad and a EKG was done showing ST depressions in V5 V6.  Patient received sublingual nitroglycerin and aspirin with  improvement in his symptoms.  Patient became chest pain-free and he arrived to our emergency room where the EKG showed T wave and ST depressions in inferolateral  leads.  At this time the patient is obtunded after receiving pain medications and most of the history was taken from the EMS and ER notes.  Cardiology was consulted.   Review of Systems    Unable to obtain due to patient's condition  Social History Social History   Tobacco Use  . Smoking status: Former Smoker    Types: Cigars    Last attempt to quit: 10/27/2017    Years since quitting: 0.8  . Smokeless tobacco: Former Network engineer Use Topics  . Alcohol use: Yes    Alcohol/week: 30.0 standard drinks    Types: 30 Cans of beer per week    Frequency: Never    Comment: drinks daily     Family History Family History  Problem Relation Age of Onset  . Emphysema Mother   . Cirrhosis Father      Prior to Admission medications   Medication Sig Start Date  End Date Taking? Authorizing Provider  divalproex (DEPAKOTE) 500 MG DR tablet Take 1 tablet (500 mg total) by mouth 2 (two) times daily. 03/27/18 08/11/26 Yes Dessa Phi, DO  folic acid (FOLVITE) 1 MG tablet Take 1 tablet (1 mg total) by mouth daily. Patient not taking: Reported on 05/11/2018 01/28/18   Cristal Ford, DO  Multiple Vitamin (MULTIVITAMIN WITH MINERALS) TABS tablet Take 1 tablet by mouth daily. Patient not taking: Reported on 05/11/2018 01/28/18   Cristal Ford, DO  nitroGLYCERIN (NITROSTAT) 0.4 MG SL tablet Place 1 tablet (0.4 mg total) under the tongue every 5 (five) minutes as needed for chest pain. Patient not taking: Reported on 27/04/8241 35/36/14   Delora Fuel, MD  thiamine 100 MG tablet Take 1 tablet (100 mg total) by mouth daily. Patient not taking: Reported on 05/11/2018 01/28/18   Cristal Ford, DO    No Known Allergies  Physical Exam  Vitals  Blood pressure (!) 170/81, pulse 75, temperature 98.3 F (36.8 C), temperature source Oral,  resp. rate 16, SpO2 97 %.   1. General well-developed, well-nourished male, noncontributory  2.  Obtunded.  3. No F.N deficits, grossly, patient moving all extremities.  4. Ears and Eyes appear Normal, Conjunctivae clear, PERRLA. Moist Oral Mucosa.  5. Supple Neck, No JVD, No cervical lymphadenopathy appriciated, No Carotid Bruits.  6. Symmetrical Chest wall movement, Good air movement bilaterally, CTAB.  7. RRR, No Gallops, Rubs or Murmurs, No Parasternal Heave.  8. Positive Bowel Sounds, Abdomen Soft, Non tender, No organomegaly appriciated,No rebound -guarding or rigidity.  9.  No Cyanosis, Normal Skin Turgor, No Skin Rash or Bruise.  10. Good muscle tone,  joints appear normal , no effusions, Normal ROM.    Data Review  CBC Recent Labs  Lab 09/01/18 1043 09/01/18 1050  WBC 7.2  --   HGB 10.9* 10.9*  HCT 32.6* 32.0*  PLT 218  --   MCV 97.9  --   MCH 32.7  --   MCHC 33.4  --   RDW 14.4  --    ------------------------------------------------------------------------------------------------------------------  Chemistries  Recent Labs  Lab 09/01/18 1043 09/01/18 1050  NA 138 138  K 4.0 4.0  CL 94* 95*  CO2 29  --   GLUCOSE 88 87  BUN 60* 55*  CREATININE 10.82* 12.00*  CALCIUM 8.5*  --    ------------------------------------------------------------------------------------------------------------------ CrCl cannot be calculated (Unknown ideal weight.). ------------------------------------------------------------------------------------------------------------------ No results for input(s): TSH, T4TOTAL, T3FREE, THYROIDAB in the last 72 hours.  Invalid input(s): FREET3   Coagulation profile No results for input(s): INR, PROTIME in the last 168 hours. ------------------------------------------------------------------------------------------------------------------- No results for input(s): DDIMER in the last 72  hours. -------------------------------------------------------------------------------------------------------------------  Cardiac Enzymes No results for input(s): CKMB, TROPONINI, MYOGLOBIN in the last 168 hours.  Invalid input(s): CK ------------------------------------------------------------------------------------------------------------------ Invalid input(s): POCBNP   ---------------------------------------------------------------------------------------------------------------  Urinalysis    Component Value Date/Time   COLORURINE YELLOW 05/12/2016 0834   APPEARANCEUR CLOUDY (A) 05/12/2016 0834   LABSPEC 1.014 05/12/2016 0834   PHURINE 5.0 05/12/2016 0834   GLUCOSEU NEGATIVE 05/12/2016 0834   HGBUR MODERATE (A) 05/12/2016 0834   BILIRUBINUR NEGATIVE 05/12/2016 0834   KETONESUR NEGATIVE 05/12/2016 0834   PROTEINUR 100 (A) 05/12/2016 0834   UROBILINOGEN 0.2 12/09/2013 0013   NITRITE NEGATIVE 05/12/2016 0834   LEUKOCYTESUR NEGATIVE 05/12/2016 0834    ----------------------------------------------------------------------------------------------------------------     Imaging results:   Dg Chest 2 View  Result Date: 08/11/2018 CLINICAL DATA:  Chest pain since 10 p.m. tonight. History of CHF and  dialysis. EXAM: CHEST - 2 VIEW COMPARISON:  08/10/2018 FINDINGS: The heart size and mediastinal contours are within normal limits. Both lungs are clear. The visualized skeletal structures are unremarkable. IMPRESSION: No active cardiopulmonary disease. Electronically Signed   By: Lucienne Capers M.D.   On: 08/11/2018 02:09   Dg Chest 2 View  Result Date: 08/10/2018 CLINICAL DATA:  Left-sided chest pain EXAM: CHEST - 2 VIEW COMPARISON:  07/20/2018, 05/11/2018 FINDINGS: No focal opacity or pleural effusion. Borderline cardiomegaly. Aortic atherosclerosis. No pneumothorax. IMPRESSION: No active cardiopulmonary disease. Electronically Signed   By: Donavan Foil M.D.   On: 08/10/2018  01:34   Dg Chest Port 1 View  Result Date: 09/01/2018 CLINICAL DATA:  Left-sided chest pain. EXAM: PORTABLE CHEST 1 VIEW COMPARISON:  08/11/2018 and 08/10/2018 FINDINGS: Heart size and pulmonary vascularity are normal the lungs are clear. Calcification and slight tortuosity of the thoracic aorta. No appreciable effusions. No significant bone abnormality. IMPRESSION: No acute abnormality. Aortic Atherosclerosis (ICD10-I70.0). Electronically Signed   By: Lorriane Shire M.D.   On: 09/01/2018 11:05    My personal review of EKG: Rhythm NSR, 78 B/M T-wave inversions & ST Depressions in Inferolateral leads  Assessment & Plan  ACS  Now CP free . EKG Changes in inferolateral leads  Cardiology on consult , Nitrpaste , ASA  Check echo Probably cardiac cath today , will keep pt NPO  H/O of combined systolic/diastolic congestive heart failure with grade 2 diastolic dysfunction by echo done on 02/2018.  ESRD on HD , will consult nephrology  Alcoholism on thiamine and folate    DVT Prophylaxis Heparin  AM Labs Ordered, also please review Full Orders    Code Status full  Disposition Plan: home  Time spent in minutes : 44  Condition GUARDED   @SIGNATURE @

## 2018-09-01 NOTE — ED Provider Notes (Signed)
[facility] EMERGENCY DEPARTMENT Provider Note   CSN: [id number] Arrival date & time: 09/01/18  1027     History   Chief Complaint Chief Complaint  Patient presents with  . Chest Pain    HPI Kenneth [name] is a 62 y.o. male with a past medical history of end-stage renal disease, combined chronic heart failure, secondary cardiomyopathy, hypertension and history of alcohol abuse who presents the emergency department via EMS for chest pain.  Patient states that he awoke with severe left-sided chest pain associated with pressure, shortness of breath, and nausea.  He tried to eat some eggs and sausage and immediately vomited them back up.  He continued having severe chest pain that did not radiate.  He called EMS.  EMS states that initially he had ST segment depressions in lead V5 and V6 without reciprocal changes.  The patient was given 324 of aspirin and sublingual nitroglycerin with both complete resolution of his pain and resolution of ST segment depression on his EKG.  He has no complaints of pain at this time.  He denies any nausea.  He has no known history of coronary artery disease.  He is not taking any anticoagulants.  He denies abdominal pain, back pain, headaches, weakness.  He dialyzes Tuesday Thursday Saturday and had a full session of dialysis yesterday.  HPI  Past Medical History:  Diagnosis Date  . Acute CHF ([place]) 01/2018  . Cardiomyopathy secondary    likely related to HTN heart disease; possibly ETOH related as well  . Chronic combined systolic and diastolic heart failure (HCC)    Echocardiogram 09/22/11: Moderate LVH, EF 57-01%, grade 3 diastolic dysfunction, mild MR, moderate to severe LAE, mild RVE, mild to moderate TR, small to moderate pericardial effusion  . ESRD (end stage renal disease) on dialysis [facility])    due to hypertensive nephrosclerosis  . History of alcohol abuse   . Hypertension     Patient Active Problem List   Diagnosis Date Noted   . ACS (acute coronary syndrome) ([place]) 09/01/2018  . ESRD (end stage renal disease) on dialysis ([place]) 07/20/2018  . Acute respiratory failure ([place]) 05/11/2018  . Cardiac arrest ([place]) 03/09/2018  . Acute encephalopathy   . Acute on chronic respiratory failure ([place]) 02/16/2018  . Tobacco dependence 02/16/2018  . Pulmonary edema 01/26/2018  . Acute respiratory failure with hypoxia ([place]) 01/26/2018  . Atrial fibrillation and flutter ([place])   . Shortness of breath   . Acute on chronic diastolic CHF (congestive heart failure) ([place])   . Hypothermia 10/08/2016  . ESRD on hemodialysis ([place]) 10/08/2016  . Fall 10/08/2016  . Syncope 10/07/2016  . Near syncope 07/07/2016  . Cardiomyopathy ([place]) 07/07/2016  . Ventricular tachycardia, non-sustained ([place]) 07/06/2016  . Hypoglycemia 07/06/2016  . Alcoholism ([place]) 05/13/2016  . Hypertensive emergency 12/08/2013  . Cardiomyopathy secondary 10/09/2011  . Essential hypertension 10/09/2011  . Alcohol withdrawal delirium ([place]) 09/25/2011  . Chronic diastolic CHF (congestive heart failure) ([place]) 09/24/2011    Past Surgical History:  Procedure Laterality Date  . AV FISTULA PLACEMENT Left 05/14/2016   Procedure: LEFT ARM BASILIC VEIN TRANSPOSITION;  Surgeon: Rosetta Posner, MD;  Location: [place];  Service: Vascular;  Laterality: Left;  . PERIPHERAL VASCULAR CATHETERIZATION N/A 05/13/2016   Procedure: Dialysis/Perma Catheter Insertion;  Surgeon: Serafina Mitchell, MD;  Location: [place] CV LAB;  Service: Cardiovascular;  Laterality: N/A;        Home Medications    Prior to Admission medications  Medication Sig Start Date End Date Taking? Authorizing Provider  divalproex (DEPAKOTE) 500 MG DR tablet Take 1 tablet (500 mg total) by mouth 2 (two) times daily. 03/27/18 08/11/26 Yes Dessa Phi, DO  folic acid (FOLVITE) 1 MG tablet Take 1 tablet (1 mg total) by mouth daily. Patient not taking: Reported on 05/11/2018 01/28/18   Cristal Ford, DO    Multiple Vitamin (MULTIVITAMIN WITH MINERALS) TABS tablet Take 1 tablet by mouth daily. Patient not taking: Reported on 05/11/2018 01/28/18   Cristal Ford, DO  nitroGLYCERIN (NITROSTAT) 0.4 MG SL tablet Place 1 tablet (0.4 mg total) under the tongue every 5 (five) minutes as needed for chest pain. Patient not taking: Reported on 16/10/958 45/40/98   Delora Fuel, MD  thiamine 100 MG tablet Take 1 tablet (100 mg total) by mouth daily. Patient not taking: Reported on 05/11/2018 01/28/18   Cristal Ford, DO    Family History Family History  Problem Relation Age of Onset  . Emphysema Mother   . Cirrhosis Father     Social History Social History   Tobacco Use  . Smoking status: Former Smoker    Types: Cigars    Last attempt to quit: 10/27/2017    Years since quitting: 0.8  . Smokeless tobacco: Former Network engineer Use Topics  . Alcohol use: Yes    Alcohol/week: 30.0 standard drinks    Types: 30 Cans of beer per week    Frequency: Never    Comment: drinks daily  . Drug use: No     Allergies   Patient has no known allergies.   Review of Systems Review of Systems  Ten systems reviewed and are negative for acute change, except as noted in the HPI.   Physical Exam Updated Vital Signs BP (!) 168/90 (BP Location: Right Arm)   Pulse 65   Temp (!) 97.5 F (36.4 C) (Oral)   Resp 18   SpO2 100%   Physical Exam  Constitutional: He appears well-developed and well-nourished. No distress.  Appears chronically ill  nontoxic  HENT:  Head: Normocephalic and atraumatic.  Eyes: Conjunctivae are normal. No scleral icterus.  Neck: Normal range of motion. Neck supple.  Cardiovascular: Normal rate, regular rhythm, normal heart sounds and normal pulses.  Left av fistula with strong thrill  Pulmonary/Chest: Effort normal and breath sounds normal. No respiratory distress.  Abdominal: Soft. There is no tenderness.  Musculoskeletal: He exhibits no edema.  Neurological: He is alert.   Skin: Skin is warm and dry. He is not diaphoretic.  Psychiatric: His behavior is normal.  Nursing note and vitals reviewed.    ED Treatments / Results  Labs (all labs ordered are listed, but only abnormal results are displayed) Labs Reviewed  BASIC METABOLIC PANEL - Abnormal; Notable for the following components:      Result Value   Chloride 94 (*)    BUN 60 (*)    Creatinine, Ser 10.82 (*)    Calcium 8.5 (*)    GFR calc non Af Amer 4 (*)    GFR calc Af Amer 5 (*)    All other components within normal limits  CBC - Abnormal; Notable for the following components:   RBC 3.33 (*)    Hemoglobin 10.9 (*)    HCT 32.6 (*)    All other components within normal limits  TROPONIN I - Abnormal; Notable for the following components:   Troponin I 0.03 (*)    All other components within normal limits  I-STAT  CHEM 8, ED - Abnormal; Notable for the following components:   Chloride 95 (*)    BUN 55 (*)    Creatinine, Ser 12.00 (*)    Calcium, Ion 0.94 (*)    TCO2 33 (*)    Hemoglobin 10.9 (*)    HCT 32.0 (*)    All other components within normal limits  TROPONIN I  TROPONIN I  I-STAT TROPONIN, ED    EKG EKG Interpretation  Date/Time:  Wednesday September 01 2018 10:32:04 EST Ventricular Rate:  78 PR Interval:    QRS Duration: 95 QT Interval:  411 QTC Calculation: 469 R Axis:   -3 Text Interpretation:  Sinus rhythm new T wave inversions inferior and lateral Confirmed by Elnora Morrison [phone]) on 09/01/2018 10:39:55 AM   Radiology Dg Chest Port 1 View  Result Date: 09/01/2018 CLINICAL DATA:  Left-sided chest pain. EXAM: PORTABLE CHEST 1 VIEW COMPARISON:  08/11/2018 and 08/10/2018 FINDINGS: Heart size and pulmonary vascularity are normal the lungs are clear. Calcification and slight tortuosity of the thoracic aorta. No appreciable effusions. No significant bone abnormality. IMPRESSION: No acute abnormality. Aortic Atherosclerosis (ICD10-I70.0). Electronically Signed   By: Lorriane Shire M.D.   On: 09/01/2018 11:05    Procedures Procedures (including critical care time)  Medications Ordered in ED Medications  folic acid (FOLVITE) tablet 1 mg (has no administration in time range)  multivitamin with minerals tablet 1 tablet (has no administration in time range)  thiamine tablet 100 mg (has no administration in time range)  nitroGLYCERIN (NITROSTAT) SL tablet 0.4 mg (has no administration in time range)  divalproex (DEPAKOTE) DR tablet 500 mg (has no administration in time range)  heparin injection 5,000 Units (has no administration in time range)  zolpidem (AMBIEN) tablet 5 mg (has no administration in time range)  ondansetron (ZOFRAN) tablet 4 mg (has no administration in time range)    Or  ondansetron (ZOFRAN) injection 4 mg (has no administration in time range)  aspirin EC tablet 81 mg (has no administration in time range)  nitroGLYCERIN (NITROGLYN) 2 % ointment 0.5 inch (has no administration in time range)  HYDROmorphone (DILAUDID) injection 0.5 mg (0.5 mg Intravenous Given 09/01/18 1436)  Chlorhexidine Gluconate Cloth 2 % PADS 6 each (has no administration in time range)  cinacalcet (SENSIPAR) tablet 60 mg (has no administration in time range)  calcitRIOL (ROCALTROL) capsule 0.75 mcg (has no administration in time range)  lanthanum (FOSRENOL) chewable tablet 2,000 mg (has no administration in time range)     Initial Impression / Assessment and Plan / ED Course  I have reviewed the triage vital signs and the nursing notes.  Pertinent labs & imaging results that were available during my care of the patient were reviewed by me and considered in my medical decision making (see chart for details).     This is a 62 year old male who presents with chest pain.  History is gathered by the patient and EMS at bedside.  The patient's PA and lateral chest films were reviewed by myself and showed no significant abnormalities except obvious aortic  atherosclerosis.  The emergent differential diagnosis of chest pain includes: Acute coronary syndrome, pericarditis, aortic dissection, pulmonary embolism, tension pneumothorax, pneumonia, and esophageal rupture. Given the patient's ST segment changes, inverted T waves and chest pain which resolved with nitroglycerin he has a very concerning story and multiple risk factors and will need admission.  I have consulted with cardiology who will see the patient.  Patient will also be admitted  by the hospitalist service.  He does not appear to need emergent dialysis at this point.  Final Clinical Impressions(s) / ED Diagnoses   Final diagnoses:  Chest pain    ED Discharge Orders    None       Margarita Mail, PA-C 09/01/18 1720    Elnora Morrison, MD 09/03/18 [phone]

## 2018-09-01 NOTE — ED Notes (Signed)
Got patient undress on the monitor did ekg shown to Dr Reather Converse patient is resting with call bell in reach

## 2018-09-02 ENCOUNTER — Encounter (HOSPITAL_COMMUNITY)
Admission: EM | Disposition: A | Discharge status: Home Health | Payer: Self-pay | Source: Home / Self Care | Attending: Internal Medicine

## 2018-09-02 ENCOUNTER — Other Ambulatory Visit: Payer: Self-pay

## 2018-09-02 DIAGNOSIS — I25110 Atherosclerotic heart disease of native coronary artery with unstable angina pectoris: Principal | ICD-10-CM

## 2018-09-02 DIAGNOSIS — I200 Unstable angina: Secondary | ICD-10-CM

## 2018-09-02 HISTORY — PX: LEFT HEART CATH AND CORONARY ANGIOGRAPHY: CATH118249

## 2018-09-02 LAB — RENAL FUNCTION PANEL
ALBUMIN: 3.3 g/dL — AB (ref 3.5–5.0)
ANION GAP: 16 — AB (ref 5–15)
BUN: 76 mg/dL — ABNORMAL HIGH (ref 8–23)
CHLORIDE: 94 mmol/L — AB (ref 98–111)
CO2: 27 mmol/L (ref 22–32)
Calcium: 8.5 mg/dL — ABNORMAL LOW (ref 8.9–10.3)
Creatinine, Ser: 13.12 mg/dL — ABNORMAL HIGH (ref 0.61–1.24)
GFR calc Af Amer: 4 mL/min — ABNORMAL LOW (ref >60)
GFR, EST NON AFRICAN AMERICAN: 3 mL/min — AB (ref >60)
Glucose, Bld: 86 mg/dL (ref 70–99)
PHOSPHORUS: 8.5 mg/dL — AB (ref 2.5–4.6)
Potassium: 5.5 mmol/L — ABNORMAL HIGH (ref 3.5–5.1)
Sodium: 137 mmol/L (ref 135–145)

## 2018-09-02 LAB — CBC
HCT: 32.6 % — ABNORMAL LOW (ref 39.0–52.0)
HEMOGLOBIN: 11.2 g/dL — AB (ref 13.0–17.0)
MCH: 33.1 pg (ref 26.0–34.0)
MCHC: 34.4 g/dL (ref 30.0–36.0)
MCV: 96.4 fL (ref 80.0–100.0)
NRBC: 0 % (ref 0.0–0.2)
Platelets: 198 10*3/uL (ref 150–400)
RBC: 3.38 MIL/uL — ABNORMAL LOW (ref 4.22–5.81)
RDW: 14.2 % (ref 11.5–15.5)
WBC: 6.8 10*3/uL (ref 4.0–10.5)

## 2018-09-02 LAB — TROPONIN I: TROPONIN I: 0.03 ng/mL — AB (ref <0.03)

## 2018-09-02 SURGERY — LEFT HEART CATH AND CORONARY ANGIOGRAPHY
Anesthesia: LOCAL

## 2018-09-02 MED ORDER — HEPARIN (PORCINE) IN NACL 1000-0.9 UT/500ML-% IV SOLN
INTRAVENOUS | Status: DC | PRN
  Administered 2018-09-02: 500 mL

## 2018-09-02 MED ORDER — SODIUM CHLORIDE 0.9% FLUSH: 3.0000 mL | INTRAVENOUS | Status: DC | PRN

## 2018-09-02 MED ORDER — PENTAFLUOROPROP-TETRAFLUOROETH EX AERO: 1.0000 "application " | INHALATION_SPRAY | CUTANEOUS | Status: DC | PRN

## 2018-09-02 MED ORDER — SODIUM CHLORIDE 0.9% FLUSH
3.0000 mL | Freq: Two times a day (BID) | INTRAVENOUS | Status: DC
  Administered 2018-09-02: 3 mL via INTRAVENOUS

## 2018-09-02 MED ORDER — HEPARIN (PORCINE) IN NACL 1000-0.9 UT/500ML-% IV SOLN
INTRAVENOUS | Status: AC
  Filled 2018-09-02: qty 1000

## 2018-09-02 MED ORDER — LIDOCAINE HCL (PF) 1 % IJ SOLN
INTRAMUSCULAR | Status: DC | PRN
  Administered 2018-09-02: 15 mL

## 2018-09-02 MED ORDER — ALTEPLASE 2 MG IJ SOLR: 2.0000 mg | Freq: Once | INTRAMUSCULAR | Status: DC | PRN

## 2018-09-02 MED ORDER — CALCITRIOL 0.25 MCG PO CAPS
ORAL_CAPSULE | ORAL | Status: AC
  Filled 2018-09-02: qty 3

## 2018-09-02 MED ORDER — MIDAZOLAM HCL 2 MG/2ML IJ SOLN
INTRAMUSCULAR | Status: AC
  Filled 2018-09-02: qty 2

## 2018-09-02 MED ORDER — SODIUM CHLORIDE 0.9% FLUSH
3.0000 mL | Freq: Two times a day (BID) | INTRAVENOUS | Status: DC
  Administered 2018-09-02 – 2018-09-07 (×5): 3 mL via INTRAVENOUS

## 2018-09-02 MED ORDER — HEPARIN SODIUM (PORCINE) 1000 UNIT/ML DIALYSIS
1000.0000 [IU] | INTRAMUSCULAR | Status: DC | PRN
  Filled 2018-09-02: qty 1

## 2018-09-02 MED ORDER — SODIUM CHLORIDE 0.9 % IV SOLN: 100.0000 mL | INTRAVENOUS | Status: DC | PRN

## 2018-09-02 MED ORDER — LORAZEPAM 1 MG PO TABS: 1.0000 mg | ORAL_TABLET | Freq: Four times a day (QID) | ORAL | Status: AC | PRN

## 2018-09-02 MED ORDER — LIDOCAINE HCL (PF) 1 % IJ SOLN: 5.0000 mL | INTRAMUSCULAR | Status: DC | PRN

## 2018-09-02 MED ORDER — CARVEDILOL 3.125 MG PO TABS
3.1250 mg | ORAL_TABLET | Freq: Two times a day (BID) | ORAL | Status: DC
  Administered 2018-09-02 – 2018-09-03 (×2): 3.125 mg via ORAL
  Filled 2018-09-02 (×2): qty 1

## 2018-09-02 MED ORDER — SODIUM CHLORIDE 0.9 % IV SOLN: 250.0000 mL | INTRAVENOUS | Status: DC | PRN

## 2018-09-02 MED ORDER — MIDAZOLAM HCL 2 MG/2ML IJ SOLN
INTRAMUSCULAR | Status: DC | PRN
  Administered 2018-09-02 (×2): 1 mg via INTRAVENOUS

## 2018-09-02 MED ORDER — LIDOCAINE HCL (PF) 1 % IJ SOLN
INTRAMUSCULAR | Status: AC
  Filled 2018-09-02: qty 30

## 2018-09-02 MED ORDER — FENTANYL CITRATE (PF) 100 MCG/2ML IJ SOLN
INTRAMUSCULAR | Status: AC
  Filled 2018-09-02: qty 2

## 2018-09-02 MED ORDER — ATORVASTATIN CALCIUM 40 MG PO TABS: 40.0000 mg | ORAL_TABLET | Freq: Every day | ORAL | Status: DC

## 2018-09-02 MED ORDER — LIDOCAINE-PRILOCAINE 2.5-2.5 % EX CREA
1.0000 "application " | TOPICAL_CREAM | CUTANEOUS | Status: DC | PRN
  Filled 2018-09-02: qty 5

## 2018-09-02 MED ORDER — HEPARIN (PORCINE) 25000 UT/250ML-% IV SOLN
1300.0000 [IU]/h | INTRAVENOUS | Status: DC
  Administered 2018-09-03: 1150 [IU]/h via INTRAVENOUS
  Administered 2018-09-03: 1050 [IU]/h via INTRAVENOUS
  Administered 2018-09-04 (×2): 1150 [IU]/h via INTRAVENOUS
  Filled 2018-09-02 (×5): qty 250

## 2018-09-02 MED ORDER — ISOSORBIDE MONONITRATE ER 30 MG PO TB24
30.0000 mg | ORAL_TABLET | Freq: Every day | ORAL | Status: DC
  Administered 2018-09-02 – 2018-09-04 (×3): 30 mg via ORAL
  Filled 2018-09-02 (×3): qty 1

## 2018-09-02 MED ORDER — FENTANYL CITRATE (PF) 100 MCG/2ML IJ SOLN
INTRAMUSCULAR | Status: DC | PRN
  Administered 2018-09-02 (×2): 25 ug via INTRAVENOUS

## 2018-09-02 MED ORDER — SODIUM CHLORIDE 0.9 % IV SOLN: INTRAVENOUS | Status: DC

## 2018-09-02 MED ORDER — LORAZEPAM 2 MG/ML IJ SOLN: 1.0000 mg | Freq: Four times a day (QID) | INTRAMUSCULAR | Status: AC | PRN

## 2018-09-02 MED ORDER — ASPIRIN 81 MG PO CHEW: 81.0000 mg | CHEWABLE_TABLET | ORAL | Status: AC

## 2018-09-02 MED ORDER — HEPARIN SODIUM (PORCINE) 1000 UNIT/ML IJ SOLN
INTRAMUSCULAR | Status: AC
  Filled 2018-09-02: qty 1

## 2018-09-02 MED ORDER — IOHEXOL 350 MG/ML SOLN
INTRAVENOUS | Status: DC | PRN
  Administered 2018-09-02: 75 mL via INTRA_ARTERIAL

## 2018-09-02 SURGICAL SUPPLY — 9 items
CATH EXPO 5F MPA-1 (CATHETERS) ×2 IMPLANT
CATH INFINITI 5 FR 3DRC (CATHETERS) ×2 IMPLANT
CATH INFINITI 5 FR AL2 (CATHETERS) ×2 IMPLANT
CATH INFINITI 5FR MULTPACK ANG (CATHETERS) ×2 IMPLANT
KIT HEART LEFT (KITS) ×2 IMPLANT
PACK CARDIAC CATHETERIZATION (CUSTOM PROCEDURE TRAY) ×2 IMPLANT
SHEATH PINNACLE 5F 10CM (SHEATH) ×2 IMPLANT
TRANSDUCER W/STOPCOCK (MISCELLANEOUS) ×2 IMPLANT
TUBING CIL FLEX 10 FLL-RA (TUBING) IMPLANT

## 2018-09-02 NOTE — Progress Notes (Addendum)
Site area: right groin a 5 french arterial sheath was removed London Pepper RN  Site Prior to Removal:  Level 0  Pressure Applied For 20 MINUTES    Bedrest Beginning at 1635p  Manual:   Yes.    Patient Status During Pull:  stable  Post Pull Groin Site:  Level 0  Post Pull Instructions Given:  Yes.    Post Pull Pulses Present:  Yes.    Dressing Applied:  Yes.    Comments:  VS remain stable

## 2018-09-02 NOTE — Progress Notes (Signed)
ANTICOAGULATION CONSULT NOTE - Initial Consult  Pharmacy Consult for heparin Indication: chest pain/ACS  No Known Allergies  Patient Measurements: Weight: 199 lb 11.8 oz (90.6 kg)(stood to scale ) Heparin Dosing Weight: 90kg  Vital Signs: Temp: 97.6 F (36.4 C) (11/07 1353) Temp Source: Oral (11/07 1353) BP: 152/78 (11/07 1605) Pulse Rate: 94 (11/07 1557)  Labs: Recent Labs    09/01/18 1043 09/01/18 1050 09/01/18 1406 09/01/18 1915 09/02/18 0229 09/02/18 0710  HGB 10.9* 10.9*  --   --   --  11.2*  HCT 32.6* 32.0*  --   --   --  32.6*  PLT 218  --   --   --   --  198  CREATININE 10.82* 12.00*  --   --   --  13.12*  TROPONINI  --   --  0.03* 0.03* 0.03*  --     Estimated Creatinine Clearance: 6.2 mL/min (A) (by C-G formula based on SCr of 13.12 mg/dL (H)).   Medical History: Past Medical History:  Diagnosis Date  . Acute CHF ([place]) 01/2018  . Cardiomyopathy secondary    likely related to HTN heart disease; possibly ETOH related as well  . Chronic combined systolic and diastolic heart failure (HCC)    Echocardiogram 09/22/11: Moderate LVH, EF 34-91%, grade 3 diastolic dysfunction, mild MR, moderate to severe LAE, mild RVE, mild to moderate TR, small to moderate pericardial effusion  . ESRD (end stage renal disease) on dialysis ([place])    due to hypertensive nephrosclerosis; TTS; [address] (09/01/2018)  . History of alcohol abuse   . Hypertension      Assessment: 47 yoM with ESRD and HTN admitted with atypical CP now s/p LHC. Pt found to have significant 3-vessel CAD and will need surgical consult. Pharmacy consulted to resume IV heparin 8hr after sheath pull (1609).   Goal of Therapy:  Heparin level 0.3-0.7 units/ml Monitor platelets by anticoagulation protocol: Yes   Plan:  -Start heparin 1050 units/hr at 0030 with no bolus -Check 8hr heparin level  Arrie Senate, PharmD, BCPS Clinical Pharmacist [phone] Please check AMION for all [facility]  numbers 09/02/2018

## 2018-09-02 NOTE — Plan of Care (Signed)
  Problem: Cardiovascular: Goal: Vascular access site(s) Level 0-1 will be maintained Outcome: Progressing   Problem: Education: Goal: Ability to demonstrate management of disease process will improve Outcome: Progressing   Problem: Activity: Goal: Capacity to carry out activities will improve Outcome: Progressing   Problem: Cardiac: Goal: Ability to achieve and maintain adequate cardiopulmonary perfusion will improve Outcome: Progressing

## 2018-09-02 NOTE — Progress Notes (Signed)
[facility] Progress Note   Subjective:   No complaints or concerns.   Objective Vitals:   09/02/18 0436 09/02/18 0443 09/02/18 0624 09/02/18 0800  BP: 139/85 123/71 (!) 162/87 (!) (P) 188/92  Pulse: 77 72 70 (P) 71  Resp: 19 18  (P) 12  Temp:      TempSrc:    (P) Oral  SpO2: 100%      Physical Exam General: sleepy, comfortable Heart: RRR, no rub Lungs: clear to bases Abdomen: soft Extremities: no edema Dialysis Access:  LUE AVF +t/b  Additional Objective Labs: Basic Metabolic Panel: Recent Labs  Lab 09/01/18 1043 09/01/18 1050 09/02/18 0710  NA 138 138 137  K 4.0 4.0 5.5*  CL 94* 95* 94*  CO2 29  --  27  GLUCOSE 88 87 86  BUN 60* 55* 76*  CREATININE 10.82* 12.00* 13.12*  CALCIUM 8.5*  --  8.5*  PHOS  --   --  8.5*   Liver Function Tests: Recent Labs  Lab 09/02/18 0710  ALBUMIN 3.3*   No results for input(s): LIPASE, AMYLASE in the last 168 hours. CBC: Recent Labs  Lab 09/01/18 1043 09/01/18 1050 09/02/18 0710  WBC 7.2  --  6.8  HGB 10.9* 10.9* 11.2*  HCT 32.6* 32.0* 32.6*  MCV 97.9  --  96.4  PLT 218  --  198   Blood Culture    Component Value Date/Time   SDES URINE, CLEAN CATCH 05/12/2016 0824   SPECREQUEST NONE 05/12/2016 0824   CULT (A) 05/12/2016 0824    7,000 COLONIES/mL INSIGNIFICANT GROWTH Performed at [facility] 05/13/2016 FINAL 05/12/2016 0824    Cardiac Enzymes: Recent Labs  Lab 09/01/18 1406 09/01/18 1915 09/02/18 0229  TROPONINI 0.03* 0.03* 0.03*   CBG: No results for input(s): GLUCAP in the last 168 hours. Iron Studies: No results for input(s): IRON, TIBC, TRANSFERRIN, FERRITIN in the last 72 hours. @lablastinr3 @ Studies/Results: Dg Chest Port 1 View  Result Date: 09/01/2018 CLINICAL DATA:  Left-sided chest pain. EXAM: PORTABLE CHEST 1 VIEW COMPARISON:  08/11/2018 and 08/10/2018 FINDINGS: Heart size and pulmonary vascularity are normal the lungs are clear. Calcification and  slight tortuosity of the thoracic aorta. No appreciable effusions. No significant bone abnormality. IMPRESSION: No acute abnormality. Aortic Atherosclerosis (ICD10-I70.0). Electronically Signed   By: Lorriane Shire M.D.   On: 09/01/2018 11:05   Medications: . sodium chloride    . sodium chloride     . aspirin EC  81 mg Oral Daily  . calcitRIOL  0.75 mcg Oral Q T,Th,Sa-HD  . Chlorhexidine Gluconate Cloth  6 each Topical Q0600  . cinacalcet  60 mg Oral Q T,Th,Sa-HD  . divalproex  500 mg Oral BID  . folic acid  1 mg Oral Daily  . heparin  5,000 Units Subcutaneous Q8H  . lanthanum  2,000 mg Oral TID WC  . multivitamin with minerals  1 tablet Oral Daily  . nitroGLYCERIN  0.5 inch Topical Q6H  . thiamine  100 mg Oral Daily    Outpt HD orders: [facility] TTS 4.25hrs 180, 400/800, 2/2, 86.5 Last HD 11/5 - pre wt 96kg, UF 3.4, post weight 92.6 ran full tx. Post weights over past month - lowest was 88.8kg  Assessment/Plan: 1 Chest pain: per primary, cycling troponins, cardiology consulting --> think likely noncardiac but in setting of mult risk factors have rec coronary CT. 2 ESRD: HD today per outpt schedule.  EDW 96kg, hasn't been achieving.  Hasn't been  weighed here yet.  UFG for today set for 4L.   3. Anemia of ESRD: last hb 10.7, no ESA outpatient.  11.2 today.  4. Metabolic Bone Disease: corr ca running low - dec'd sensipar from 90 to 60s, 2.5ca dialysate, continue calcitriol 0.75 per outpt.  Phos runs in the 7-8s - renal diet here, fosrenol 2g TID per outpatient dose > phos is 8.5 today.  Jannifer Hick MD 09/02/2018, 9:30 AM  [facility] Pager: [phone]

## 2018-09-02 NOTE — Progress Notes (Addendum)
Progress Note  Patient Name: Kenneth [name] Date of Encounter: 09/02/2018  Primary Cardiologist:  Peter [name], MD   Subjective   Seen in HD. Pt reports chest pain recurrence this AM twice which was relieved by SL NTG. EKG with significant T wave inversion in anterolateral leads when compared to prior tracing from yesterday. Will proceed with cath secondary to above.   Inpatient Medications    Scheduled Meds: . aspirin EC  81 mg Oral Daily  . calcitRIOL  0.75 mcg Oral Q T,Th,Sa-HD  . Chlorhexidine Gluconate Cloth  6 each Topical Q0600  . cinacalcet  60 mg Oral Q T,Th,Sa-HD  . divalproex  500 mg Oral BID  . folic acid  1 mg Oral Daily  . heparin      . heparin  5,000 Units Subcutaneous Q8H  . lanthanum  2,000 mg Oral TID WC  . multivitamin with minerals  1 tablet Oral Daily  . nitroGLYCERIN  0.5 inch Topical Q6H  . thiamine  100 mg Oral Daily   Continuous Infusions: . sodium chloride    . sodium chloride     PRN Meds: sodium chloride, sodium chloride, alteplase, heparin, HYDROmorphone (DILAUDID) injection, lidocaine (PF), lidocaine-prilocaine, nitroGLYCERIN, ondansetron **OR** ondansetron (ZOFRAN) IV, pentafluoroprop-tetrafluoroeth, zolpidem   Vital Signs    Vitals:   09/02/18 0900 09/02/18 0930 09/02/18 1000 09/02/18 1030  BP: 137/74 (!) 82/49 (!) 111/54 (!) 106/49  Pulse: 82 90 70 64  Resp:      Temp:      TempSrc:      SpO2:      Weight:        Intake/Output Summary (Last 24 hours) at 09/02/2018 1124 Last data filed at 09/01/2018 2129 Gross per 24 hour  Intake 240 ml  Output 0 ml  Net 240 ml   Filed Weights   09/02/18 0800  Weight: 93.3 kg    Physical Exam   General: Ill appearing, NAD Skin: Warm, dry, intact  Head: Normocephalic, atraumatic, clear, moist mucus membranes. Neck: Negative for carotid bruits. No JVD Lungs:Clear to ausculation bilaterally. No wheezes, rales, or rhonchi. Breathing is unlabored. Cardiovascular: RRR with S1 S2. No  murmurs, rubs, gallops, or LV heave appreciated. Abdomen: Soft, non-tender, non-distended with normoactive bowel sounds. No obvious abdominal masses. MSK: Strength and tone appear normal for age. 5/5 in all extremities Extremities: No edema. No clubbing or cyanosis. DP/PT pulses 2+ bilaterally Neuro: Alert and oriented. No focal deficits. No facial asymmetry. MAE spontaneously. Psych: Responds to questions appropriately with normal affect.    Labs    Chemistry Recent Labs  Lab 09/01/18 1043 09/01/18 1050 09/02/18 0710  NA 138 138 137  K 4.0 4.0 5.5*  CL 94* 95* 94*  CO2 29  --  27  GLUCOSE 88 87 86  BUN 60* 55* 76*  CREATININE 10.82* 12.00* 13.12*  CALCIUM 8.5*  --  8.5*  ALBUMIN  --   --  3.3*  GFRNONAA 4*  --  3*  GFRAA 5*  --  4*  ANIONGAP 15  --  16*     Hematology Recent Labs  Lab 09/01/18 1043 09/01/18 1050 09/02/18 0710  WBC 7.2  --  6.8  RBC 3.33*  --  3.38*  HGB 10.9* 10.9* 11.2*  HCT 32.6* 32.0* 32.6*  MCV 97.9  --  96.4  MCH 32.7  --  33.1  MCHC 33.4  --  34.4  RDW 14.4  --  14.2  PLT 218  --  198   Cardiac Enzymes Recent Labs  Lab 09/01/18 1406 09/01/18 1915 09/02/18 0229  TROPONINI 0.03* 0.03* 0.03*    Recent Labs  Lab 09/01/18 1048  TROPIPOC 0.04     BNPNo results for input(s): BNP, PROBNP in the last 168 hours.   DDimer No results for input(s): DDIMER in the last 168 hours.   Radiology    Dg Chest Port 1 View  Result Date: 09/01/2018 CLINICAL DATA:  Left-sided chest pain. EXAM: PORTABLE CHEST 1 VIEW COMPARISON:  08/11/2018 and 08/10/2018 FINDINGS: Heart size and pulmonary vascularity are normal the lungs are clear. Calcification and slight tortuosity of the thoracic aorta. No appreciable effusions. No significant bone abnormality. IMPRESSION: No acute abnormality. Aortic Atherosclerosis (ICD10-I70.0). Electronically Signed   By: Lorriane Shire M.D.   On: 09/01/2018 11:05   Telemetry    09/02/18 NSR - Personally Reviewed  ECG      09/02/18 NSR with worsening T wave inversions and ST depression in anterolateral leads- Personally Reviewed  Cardiac Studies   2D Echo 03/09/18  Study Conclusions  - Left ventricle: The cavity size was normal. There was severe concentric hypertrophy. Systolic function was normal. Wall motion was normal; there were no regional wall motion abnormalities. Features are consistent with a pseudonormal left ventricular filling pattern, with concomitant abnormal relaxation and increased filling pressure (grade 2 diastolic dysfunction). Doppler parameters are consistent with elevated ventricular end-diastolic filling pressure. - Aortic valve: Probably trileaflet; moderately thickened, moderately calcified leaflets. - Mitral valve: Calcified annulus. Moderately thickened, mildly calcified leaflets . There was mild regurgitation. - Left atrium: The atrium was mildly dilated. - Right ventricle: The cavity size was moderately dilated. Wall thickness was normal. Systolic function was moderately reduced. - Right atrium: The atrium was normal in size. - Tricuspid valve: There was mild regurgitation. - Pulmonary arteries: Systolic pressure was within the normal range. - Inferior vena cava: The vessel was normal in size. - Pericardium, extracardiac: There was no pericardial effusion.  Impressions:  - Severe concentric LVH, LVEF 55-60%. Grade 2 diastolic dysfunction. Moderately decreased RV systolic function. Thickened aortic and mitral valves. Consider evaluation for cardiac amyloidosis including PYP scan (nuclear scan for evaluation of TTR amyloidosis).  Patient Profile     62 y.o. male with a hx of cardiomyopathy w/ prior EF of 25-30%, felt to be NICM given low risk Myoview in 2017, EF has since normalized on subsequent echos, diastolic HF,  h/o heavy ETHO use,  NSVT, atrial fibrillation/flutter previously felt to be a poor candidate for anticoagulation  due to poor compliance and ETHO use, ESRD on HD (Tu,Th,Sa) and HTN, who is being seen today for evaluation for CP and abnormal EKG, at the request of Dr. Laren Everts, Internal Medicine.   Assessment & Plan    1. Chest Pain:  -Pt presented with mixed typical and atypical features including recent intermitted left sided pressure-like discomfort with occasional radiation to the left arm, ~10 min duration with mild relief with SL NTG -Stress test in 2017 that was negative for ischemia -Abnormal EKG 09/01/18 showing new inferior and lateral TWIs>>>repeat EKG this AM with worsening T wave inversion and ST depression in anterolateral leads>>plan discussed with MD and will proceed with cath for further evaluation.  -Echocardiogram with normal LVEF NWMA and G1DD, similar to prior studies -Trop, 0.03>0.03>0.03 -The risks and benefits of a cardiac catheterization including, but not limited to, death, stroke, MI, kidney damage and bleeding were discussed with the patient who indicates understanding and agrees to proceed.  -  ASA -Will need BB if significant CAD found on cath   2. ESRD: -HD today>>>tolerated well -HD Tu, Th, Sat  4. HTN: -Stable, 118/63>106/62>106/49 -No antihypertensives    Signed, Kathyrn Drown NP-C [facility] Pager: [phone] 09/02/2018, 11:24 AM     Patient seen and examined. Agree with assessment and plan.  The patient has experienced recurrent episodes of chest pressure this morning.  He is currently undergoing dialysis.  ECG shows no significant ischemic T wave inversions anterolaterally.  I have discussed need for cardiac catheterization to be done shortly after he completes his dialysis with high likelihood of significant cardiac obstructive disease.  I discussed the catheterization procedure with the patient. The risks and benefits of a cardiac catheterization including, but not limited to, death, stroke, MI, kidney damage and bleeding were discussed with the patient who  indicates understanding and agrees to proceed.    Troy Sine, MD, [facility] 09/02/2018 11:30 am   For questions or updates, please contact   Please consult [email or web address] for contact info under Cardiology/STEMI.

## 2018-09-02 NOTE — Interval H&P Note (Signed)
History and Physical Interval Note:  09/02/2018 3:16 PM  Kenneth [name]  has presented today for cardiac catheterization, with the diagnosis of chest pain. The various methods of treatment have been discussed with the patient and family. After consideration of risks, benefits and other options for treatment, the patient has consented to  Procedure(s): LEFT HEART CATH AND CORONARY ANGIOGRAPHY (N/A) as a surgical intervention .  The patient's history has been reviewed, patient examined, no change in status, stable for surgery.  I have reviewed the patient's chart and labs.  Questions were answered to the patient's satisfaction.    Cath Lab Visit (complete for each Cath Lab visit)  Clinical Evaluation Leading to the Procedure:   ACS: Yes.  (unstable angina)  Non-ACS:  N/A  Jazzmin Newbold

## 2018-09-02 NOTE — Progress Notes (Signed)
Pt c/o chest pain rated 6/10. Nitro paste already in place. EKG done. 2 SL nitro given. Provider paged. Asked RN to page Cardiology. Cardiology notified. No new orders given. Pt states pain now 0.

## 2018-09-02 NOTE — Progress Notes (Signed)
Patient Demographics:    Kenneth [name], is a 62 y.o. male, DOB - [birth year], XMI:[id number]  Admit date - 09/01/2018   Admitting Physician Merton Border, MD  Outpatient Primary MD for the patient is [name], MD  LOS - 1   Chief Complaint  Patient presents with  . Chest Pain        Subjective:    Kenneth [name] today has no fevers, no emesis, tolerated hemodialysis well, no fevers, chest pains are intermittent   Assessment  & Plan :    Active Problems:   Chronic diastolic CHF (congestive heart failure) (HCC)   Alcoholism (HCC)   ACS (acute coronary syndrome) ([place])   Unstable angina ([place])  LHC 09/02/18 Conclusions: 1. Severe multivessel coronary artery disease involving the distal LMCA, ostial/proximal LAD, ostial and proximal LCx, and mid RCA. 2. Normal left ventricular filling pressure. 3. Scattered atherosclerotic calcification of the ascending aorta.  Recommendations: 1. Cardiac surgery consultation for CABG.  CT chest may be helpful to further assess degree of aortic calcification. 2. Initiate heparin infusion 8 hours after sheath removal. 3. Aggressive secondary prevention, including indefinite aspirin 81 mg daily and high-intensity statin therapy. 4. Plan for hemodialysis tomorrow.   Brief Summary  62 y.o. AA male with a hx of cardiomyopathy w/ prior EF of 25-30%, felt to be NICM given low risk Myoview in 2017, EF has since normalized on subsequent echos, diastolic HF,  h/o heavy ETHO use,  NSVT, atrial fibrillation/flutter previously felt to be a poor candidate for anticoagulation due to poor compliance and ETHO use, ESRD on HD (Tu,Th,Sa) and HTN admitted on 09/01/2018 with intermittent chest pains and found to have severe obstructive CAD, currently awaiting CT surgery consult for possible CABG, on IV heparin   Plan:- 1)Chest Pains/CAD--- LHC on 09/02/18 as noted above with severe  multivessel CAD involving the distal LMCA, ostial/proximal LAD, ostial and proximal LCx, and mid RCA, CT surgery consult for possible CABG pending, IV heparin per cardiologist, add Coreg 3.25 mg twice daily, Lipitor 40 mg nightly, and Imdur 30 mg daily  2)HFpEF--echo shows EF of 55 to 60% with grade 2 dCHF, Thickened aortic and mitral valves, patient may need evaluation for cardiac amyloidosis including PYP scan (nuclear scan for evaluation of TTR amyloidosis), continue to use hemodialysis to address volume status  3)ESRD----usually gets HD on Tuesday Thursdays and Saturdays, patient had HD on 09/02/2018 prior to [facility], will get another session of HD on 09/03/2018 due to contrast exposure with LHC on 09/02/18  4)H/o Etoh Abuse----folic acid and thiamine and multivitamin as ordered, lorazepam per CIWA protocol  5)H/o PAFib--- previously felt not to be a candidate for anticoagulation due to EtOH abuse and poor compliance, IV heparin as ordered for anticoagulation, Coreg as above for rate control  Disposition/Need for in-Hospital Stay- patient unable to be discharged at this time due to severe obstructive CAD, awaiting CT surgery consult for possible CABG, on IV heparin  Code Status : Full   Disposition Plan  : home   Consults  :  CT surgery/Cardiology/Nephrology   DVT Prophylaxis  :  Iv Heparin  Lab Results  Component Value Date   PLT 198 09/02/2018    Inpatient Medications  Scheduled Meds: . aspirin EC  81 mg Oral Daily  . calcitRIOL  0.75 mcg Oral Q T,Th,Sa-HD  . Chlorhexidine Gluconate Cloth  6 each Topical Q0600  . cinacalcet  60 mg Oral Q T,Th,Sa-HD  . divalproex  500 mg Oral BID  . folic acid  1 mg Oral Daily  . heparin      . lanthanum  2,000 mg Oral TID WC  . multivitamin with minerals  1 tablet Oral Daily  . nitroGLYCERIN  0.5 inch Topical Q6H  . sodium chloride flush  3 mL Intravenous Q12H  . thiamine  100 mg Oral Daily   Continuous Infusions: . sodium chloride    .  [START ON 09/03/2018] heparin     PRN Meds:.sodium chloride, HYDROmorphone (DILAUDID) injection, nitroGLYCERIN, ondansetron **OR** ondansetron (ZOFRAN) IV, sodium chloride flush, zolpidem    Anti-infectives (From admission, onward)   None        Objective:   Vitals:   09/02/18 1707 09/02/18 1710 09/02/18 1725 09/02/18 1750  BP: (!) 144/89  137/86 (!) 157/88  Pulse: 81 74 80 78  Resp:      Temp:  97.8 F (36.6 C)  98 F (36.7 C)  TempSrc:  Oral  Oral  SpO2:  97% 99% 100%  Weight:        Wt Readings from Last 3 Encounters:  09/02/18 90.6 kg  08/11/18 86.2 kg  07/20/18 91 kg     Intake/Output Summary (Last 24 hours) at 09/02/2018 1757 Last data filed at 09/01/2018 2129 Gross per 24 hour  Intake 240 ml  Output 0 ml  Net 240 ml     Physical Exam Patient is examined daily including today on 09/02/18 , exams remain the same as of yesterday except that has changed   Gen:- Awake Alert,  In no apparent distress  HEENT:- [place].AT, No sclera icterus Neck-Supple Neck, No JVD after hemodialysis,.  Lungs-diminished in bases, no wheezing CV- S1, S2 normal, irregular Abd-  +ve B.Sounds, Abd Soft, No tenderness,    Extremity/Skin:- No  edema, Lt Arm AVF with positive thrill and bruit Psych-affect is appropriate, oriented x3 Neuro-no new focal deficits, no tremors   Data Review:   Micro Results Recent Results (from the past 240 hour(s))  MRSA PCR Screening     Status: None   Collection Time: 09/01/18  5:29 PM  Result Value Ref Range Status   MRSA by PCR NEGATIVE NEGATIVE Final    Comment:        The GeneXpert MRSA Assay (FDA approved for NASAL specimens only), is one component of a comprehensive MRSA colonization surveillance program. It is not intended to diagnose MRSA infection nor to guide or monitor treatment for MRSA infections. Performed at [facility], [place] [address] [place], [place] [address]     Radiology Reports Dg Chest 2 View  Result Date:  08/11/2018 CLINICAL DATA:  Chest pain since 10 p.m. tonight. History of CHF and dialysis. EXAM: CHEST - 2 VIEW COMPARISON:  08/10/2018 FINDINGS: The heart size and mediastinal contours are within normal limits. Both lungs are clear. The visualized skeletal structures are unremarkable. IMPRESSION: No active cardiopulmonary disease. Electronically Signed   By: Lucienne Capers M.D.   On: 08/11/2018 02:09   Dg Chest 2 View  Result Date: 08/10/2018 CLINICAL DATA:  Left-sided chest pain EXAM: CHEST - 2 VIEW COMPARISON:  07/20/2018, 05/11/2018 FINDINGS: No focal opacity or pleural effusion. Borderline cardiomegaly. Aortic atherosclerosis. No pneumothorax. IMPRESSION: No active cardiopulmonary disease. Electronically Signed   By: Maudie Mercury  Francoise Ceo M.D.   On: 08/10/2018 01:34   Dg Chest Port 1 View  Result Date: 09/01/2018 CLINICAL DATA:  Left-sided chest pain. EXAM: PORTABLE CHEST 1 VIEW COMPARISON:  08/11/2018 and 08/10/2018 FINDINGS: Heart size and pulmonary vascularity are normal the lungs are clear. Calcification and slight tortuosity of the thoracic aorta. No appreciable effusions. No significant bone abnormality. IMPRESSION: No acute abnormality. Aortic Atherosclerosis (ICD10-I70.0). Electronically Signed   By: Lorriane Shire M.D.   On: 09/01/2018 11:05     CBC Recent Labs  Lab 09/01/18 1043 09/01/18 1050 09/02/18 0710  WBC 7.2  --  6.8  HGB 10.9* 10.9* 11.2*  HCT 32.6* 32.0* 32.6*  PLT 218  --  198  MCV 97.9  --  96.4  MCH 32.7  --  33.1  MCHC 33.4  --  34.4  RDW 14.4  --  14.2    Chemistries  Recent Labs  Lab 09/01/18 1043 09/01/18 1050 09/02/18 0710  NA 138 138 137  K 4.0 4.0 5.5*  CL 94* 95* 94*  CO2 29  --  27  GLUCOSE 88 87 86  BUN 60* 55* 76*  CREATININE 10.82* 12.00* 13.12*  CALCIUM 8.5*  --  8.5*   ------------------------------------------------------------------------------------------------------------------ No results for input(s): CHOL, HDL, LDLCALC, TRIG,  CHOLHDL, LDLDIRECT in the last 72 hours.  Lab Results  Component Value Date   HGBA1C 5.1 12/08/2013   ------------------------------------------------------------------------------------------------------------------ No results for input(s): TSH, T4TOTAL, T3FREE, THYROIDAB in the last 72 hours.  Invalid input(s): FREET3 ------------------------------------------------------------------------------------------------------------------ No results for input(s): VITAMINB12, FOLATE, FERRITIN, TIBC, IRON, RETICCTPCT in the last 72 hours.  Coagulation profile No results for input(s): INR, PROTIME in the last 168 hours.  No results for input(s): DDIMER in the last 72 hours.  Cardiac Enzymes Recent Labs  Lab 09/01/18 1406 09/01/18 1915 09/02/18 0229  TROPONINI 0.03* 0.03* 0.03*   ------------------------------------------------------------------------------------------------------------------    Component Value Date/Time   BNP 678.7 (H) 08/10/2018 8675     Roxan Hockey M.D on 09/02/2018 at 5:57 PM  Pager---[phone] Go to [email or web address] - password [email or web address] for contact info  [facility] - Office  [phone]

## 2018-09-03 ENCOUNTER — Encounter (HOSPITAL_COMMUNITY): Payer: Self-pay | Admitting: Internal Medicine

## 2018-09-03 ENCOUNTER — Other Ambulatory Visit: Payer: Self-pay | Admitting: *Deleted

## 2018-09-03 ENCOUNTER — Inpatient Hospital Stay (HOSPITAL_COMMUNITY): Payer: Medicare Other

## 2018-09-03 DIAGNOSIS — I200 Unstable angina: Secondary | ICD-10-CM

## 2018-09-03 DIAGNOSIS — Z01810 Encounter for preprocedural cardiovascular examination: Secondary | ICD-10-CM

## 2018-09-03 DIAGNOSIS — F1020 Alcohol dependence, uncomplicated: Secondary | ICD-10-CM

## 2018-09-03 DIAGNOSIS — I2510 Atherosclerotic heart disease of native coronary artery without angina pectoris: Secondary | ICD-10-CM

## 2018-09-03 DIAGNOSIS — I5042 Chronic combined systolic (congestive) and diastolic (congestive) heart failure: Secondary | ICD-10-CM

## 2018-09-03 DIAGNOSIS — I25110 Atherosclerotic heart disease of native coronary artery with unstable angina pectoris: Secondary | ICD-10-CM

## 2018-09-03 DIAGNOSIS — I249 Acute ischemic heart disease, unspecified: Secondary | ICD-10-CM

## 2018-09-03 DIAGNOSIS — I10 Essential (primary) hypertension: Secondary | ICD-10-CM

## 2018-09-03 DIAGNOSIS — Z992 Dependence on renal dialysis: Secondary | ICD-10-CM

## 2018-09-03 DIAGNOSIS — N186 End stage renal disease: Secondary | ICD-10-CM

## 2018-09-03 LAB — CBC
HCT: 31.8 % — ABNORMAL LOW (ref 39.0–52.0)
Hemoglobin: 10.5 g/dL — ABNORMAL LOW (ref 13.0–17.0)
MCH: 32 pg (ref 26.0–34.0)
MCHC: 33 g/dL (ref 30.0–36.0)
MCV: 97 fL (ref 80.0–100.0)
NRBC: 0 % (ref 0.0–0.2)
PLATELETS: 250 10*3/uL (ref 150–400)
RBC: 3.28 MIL/uL — AB (ref 4.22–5.81)
RDW: 13.9 % (ref 11.5–15.5)
WBC: 6.6 10*3/uL (ref 4.0–10.5)

## 2018-09-03 LAB — HEPARIN LEVEL (UNFRACTIONATED)
HEPARIN UNFRACTIONATED: 0.28 [IU]/mL — AB (ref 0.30–0.70)
Heparin Unfractionated: 0.38 IU/mL (ref 0.30–0.70)

## 2018-09-03 MED ORDER — CARVEDILOL 6.25 MG PO TABS
6.2500 mg | ORAL_TABLET | Freq: Two times a day (BID) | ORAL | Status: DC
  Administered 2018-09-03 – 2018-09-06 (×5): 6.25 mg via ORAL
  Filled 2018-09-03 (×5): qty 1

## 2018-09-03 MED ORDER — SODIUM CHLORIDE 0.9 % IV SOLN: 100.0000 mL | INTRAVENOUS | Status: DC | PRN

## 2018-09-03 MED ORDER — CHLORHEXIDINE GLUCONATE CLOTH 2 % EX PADS: 6.0000 | MEDICATED_PAD | Freq: Every day | CUTANEOUS | Status: DC

## 2018-09-03 MED ORDER — LIDOCAINE-PRILOCAINE 2.5-2.5 % EX CREA: 1.0000 "application " | TOPICAL_CREAM | CUTANEOUS | Status: DC | PRN

## 2018-09-03 MED ORDER — HEPARIN SODIUM (PORCINE) 1000 UNIT/ML DIALYSIS: 1000.0000 [IU] | INTRAMUSCULAR | Status: DC | PRN

## 2018-09-03 MED ORDER — PENTAFLUOROPROP-TETRAFLUOROETH EX AERO: 1.0000 "application " | INHALATION_SPRAY | CUTANEOUS | Status: DC | PRN

## 2018-09-03 MED ORDER — HEPARIN SODIUM (PORCINE) 1000 UNIT/ML DIALYSIS: 20.0000 [IU]/kg | INTRAMUSCULAR | Status: DC | PRN

## 2018-09-03 MED ORDER — LIDOCAINE HCL (PF) 1 % IJ SOLN: 5.0000 mL | INTRAMUSCULAR | Status: DC | PRN

## 2018-09-03 MED ORDER — ATORVASTATIN CALCIUM 80 MG PO TABS
80.0000 mg | ORAL_TABLET | Freq: Every day | ORAL | Status: DC
  Administered 2018-09-03 – 2018-09-06 (×4): 80 mg via ORAL
  Filled 2018-09-03 (×4): qty 1

## 2018-09-03 NOTE — Progress Notes (Signed)
Pre cabg evaluation  Right Carotid:Velocities in the right ICA are consistent with a 1-39% stenosis.  Left Carotid: Velocities in the left ICA are consistent with a 1-39% stenosis.    Right ABI: Resting right ankle-brachial index indicates moderate right lower extremity arterial disease.  Left ABI: Resting left ankle-brachial index indicates moderate left lower extremity arterial disease.    Right Upper Extremity: Doppler waveforms decrease >50% with right radial compression. Doppler waveforms remain within normal limits with right ulnar compression.  Left Upper Extremity: Left not evaluated due to AV fistula.    Landry Mellow, RDMS, RVT

## 2018-09-03 NOTE — Consult Note (Addendum)
[address]       [place] [address]             [phone]        Kenneth [name] [place] Medical Record #[id number] Date of Birth: 10-Jul-1956  Referring: Dr. Saunders Revel, MD Primary Care: [name], MD Primary Cardiologist:Takela Varden [name], MD  Chief Complaint:    Chief Complaint  Patient presents with  . Chest Pain and shortness of breath  Reason for consultation: Coronary artery disease  History of Present Illness:     This is a 62 year old African American male with a past medical history of cardiomyopathy (felt to be non ischemic), chronic combined systolic and diastolic heart failure, hypertension, alcohol abuse, ESRD (on HD), a fib/flutter (felt be poor candidate for anticoagulation secondary to poor compliance and alcohol abuse) who presented to [facility] ED on 09/01/2018 with complaints of chest pain and shortness of breath. He also had complaints of nausea. EKG showed sinus rhythm with new inferior lateral TWIs. Troponin I's have been 0.03. Patient underwent an echocardiogram which showed LVEF 55-60%,  no regional wall motion abnormalities,  no significant valvular disease (mild MR), and probable small PFO. A cardiac catheterization ws then done by Dr. Saunders Revel on 09/02/2018. Results showed coronary disease involving the distal left main, ostial/proximal LAD and Circumflex, and mid RCA. In addition, there was scattered atherosclerotic calcification of the ascending aorta. Dr. Prescott Gum has been consulted for the consideration of coronary artery bypass grafting surgery. Currently, the patient is on a Heparin drip, chest pain free, and vital signs are stable.  Current Activity/ Functional Status: Patient is independent with mobility/ambulation, transfers, ADL's, IADL's.   Zubrod Score: At the time of surgery this patient's most appropriate activity status/level should be described as: []     0    Normal activity, no symptoms [x]     1    Restricted in physical  strenuous activity but ambulatory, able to do out light work []     2    Ambulatory and capable of self care, unable to do work activities, up and about more than 50%  Of the time               []     3    Only limited self care, in bed greater than 50% of waking hours []     4    Completely disabled, no self care, confined to bed or chair []     5    Moribund  Past Medical History:  Diagnosis Date  . Acute CHF ([place]) 01/2018  . Cardiomyopathy secondary    likely related to HTN heart disease; possibly ETOH related as well  . Chronic combined systolic and diastolic heart failure (HCC)    Echocardiogram 09/22/11: Moderate LVH, EF 73-22%, grade 3 diastolic dysfunction, mild MR, moderate to severe LAE, mild RVE, mild to moderate TR, small to moderate pericardial effusion  . ESRD (end stage renal disease) on dialysis ([place])    due to hypertensive nephrosclerosis; TTS; [facility] (09/01/2018)  . History of alcohol abuse   . Hypertension     Past Surgical History:  Procedure Laterality Date  . AV FISTULA PLACEMENT Left 05/14/2016   Procedure: LEFT ARM BASILIC VEIN TRANSPOSITION;  Surgeon: Rosetta Posner, MD;  Location: [facility];  Service: Vascular;  Laterality: Left;  . LEFT HEART CATH AND CORONARY ANGIOGRAPHY N/A 09/02/2018   Procedure: LEFT HEART CATH AND CORONARY ANGIOGRAPHY;  Surgeon: Nelva Bush, MD;  Location: [facility];  Service: Cardiovascular;  Laterality: N/A;  . PERIPHERAL VASCULAR CATHETERIZATION N/A 05/13/2016   Procedure: Dialysis/Perma Catheter Insertion;  Surgeon: Serafina Mitchell, MD;  Location: [facility];  Service: Cardiovascular;  Laterality: N/A;    Social History   Tobacco Use  Smoking Status Former Smoker  . Packs/day: 2.00  . Years: 48.00  . Pack years: 96.00  . Types: Cigars  . Last attempt to quit: 10/27/2017  . Years since quitting: 0.8  Smokeless Tobacco Former Systems developer  . Types: Chew    Social History   Substance and Sexual Activity  Alcohol Use Yes    . Frequency: Never   Comment: 09/01/2018 "nothing last 5 months"  Patient lives with son and 2 daughters  Allergy: No Known Allergies  Current Facility-Administered Medications  Medication Dose Route Frequency Provider Last Rate Last Dose  . 0.9 %  sodium chloride infusion  250 mL Intravenous PRN End, Harrell Gave, MD      . 0.9 %  sodium chloride infusion  100 mL Intravenous PRN Ejigiri, Thomos Lemons, PA-C      . 0.9 %  sodium chloride infusion  100 mL Intravenous PRN Ejigiri, Thomos Lemons, PA-C      . aspirin EC tablet 81 mg  81 mg Oral Daily End, Christopher, MD   81 mg at 09/02/18 1402  . atorvastatin (LIPITOR) tablet 40 mg  40 mg Oral q1800 Emokpae, Courage, MD      . calcitRIOL (ROCALTROL) capsule 0.75 mcg  0.75 mcg Oral Q T,Th,Sa-HD End, Christopher, MD   0.75 mcg at 09/02/18 1302  . carvedilol (COREG) tablet 3.125 mg  3.125 mg Oral BID WC Emokpae, Courage, MD   3.125 mg at 09/02/18 1844  . Chlorhexidine Gluconate Cloth 2 % PADS 6 each  6 each Topical Q0600 End, Harrell Gave, MD      . cinacalcet (SENSIPAR) tablet 60 mg  60 mg Oral Q T,Th,Sa-HD End, Christopher, MD      . divalproex (DEPAKOTE) DR tablet 500 mg  500 mg Oral BID End, Christopher, MD   500 mg at 09/02/18 2136  . folic acid (FOLVITE) tablet 1 mg  1 mg Oral Daily End, Christopher, MD   1 mg at 09/02/18 1402  . heparin ADULT infusion 100 units/mL (25000 units/256mL sodium chloride 0.45%)  1,050 Units/hr Intravenous Continuous Einar Grad, RPH 10.5 mL/hr at 09/03/18 0800 1,050 Units/hr at 09/03/18 0800  . heparin injection 1,000 Units  1,000 Units Dialysis PRN Lynnda Child, PA-C      . heparin injection 1,800 Units  20 Units/kg Dialysis PRN Lynnda Child, PA-C      . HYDROmorphone (DILAUDID) injection 0.5 mg  0.5 mg Intravenous Q3H PRN End, Harrell Gave, MD   0.5 mg at 09/02/18 0239  . isosorbide mononitrate (IMDUR) 24 hr tablet 30 mg  30 mg Oral Daily Emokpae, Courage, MD   30 mg at 09/02/18 1844  .  lanthanum (FOSRENOL) chewable tablet 2,000 mg  2,000 mg Oral TID WC End, Christopher, MD      . lidocaine (PF) (XYLOCAINE) 1 % injection 5 mL  5 mL Intradermal PRN Ejigiri, Thomos Lemons, PA-C      . lidocaine-prilocaine (EMLA) cream 1 application  1 application Topical PRN Lynnda Child, PA-C      . LORazepam (ATIVAN) tablet 1 mg  1 mg Oral Q6H PRN Roxan Hockey, MD       Or  .  LORazepam (ATIVAN) injection 1 mg  1 mg Intravenous Q6H PRN Emokpae, Courage, MD      . multivitamin with minerals tablet 1 tablet  1 tablet Oral Daily End, Christopher, MD   1 tablet at 09/02/18 1402  . nitroGLYCERIN (NITROSTAT) SL tablet 0.4 mg  0.4 mg Sublingual Q5 min PRN End, Harrell Gave, MD   0.4 mg at 09/02/18 0438  . ondansetron (ZOFRAN) tablet 4 mg  4 mg Oral Q6H PRN End, Harrell Gave, MD       Or  . ondansetron (ZOFRAN) injection 4 mg  4 mg Intravenous Q6H PRN End, Harrell Gave, MD      . pentafluoroprop-tetrafluoroeth (GEBAUERS) aerosol 1 application  1 application Topical PRN Lynnda Child, PA-C      . sodium chloride flush (NS) 0.9 % injection 3 mL  3 mL Intravenous Q12H End, Christopher, MD   3 mL at 09/02/18 2136  . sodium chloride flush (NS) 0.9 % injection 3 mL  3 mL Intravenous PRN End, Harrell Gave, MD      . thiamine (VITAMIN B-1) tablet 100 mg  100 mg Oral Daily End, Christopher, MD   100 mg at 09/02/18 1402  . zolpidem (AMBIEN) tablet 5 mg  5 mg Oral QHS PRN,MR X 1 End, Christopher, MD        Medications Prior to Admission  Medication Sig Dispense Refill Last Dose  . divalproex (DEPAKOTE) 500 MG DR tablet Take 1 tablet (500 mg total) by mouth 2 (two) times daily. 60 tablet 0 08/31/2018 at 1700  . folic acid (FOLVITE) 1 MG tablet Take 1 tablet (1 mg total) by mouth daily. (Patient not taking: Reported on 05/11/2018) 30 tablet 0 Not Taking at Unknown time  . Multiple Vitamin (MULTIVITAMIN WITH MINERALS) TABS tablet Take 1 tablet by mouth daily. (Patient not taking: Reported on 05/11/2018)  30 tablet 0 Not Taking at Unknown time  . nitroGLYCERIN (NITROSTAT) 0.4 MG SL tablet Place 1 tablet (0.4 mg total) under the tongue every 5 (five) minutes as needed for chest pain. (Patient not taking: Reported on 09/01/2018) 30 tablet 0 Not Taking at Unknown time  . thiamine 100 MG tablet Take 1 tablet (100 mg total) by mouth daily. (Patient not taking: Reported on 05/11/2018) 30 tablet 0 Not Taking at Unknown time    Family History  Problem Relation Age of Onset  . Emphysema Mother   . Cirrhosis Father    Review of Systems:    Cardiac Review of Systems: YES = [Y] or NO= [N]  Orthopnea [ N ]   Pedal Edema [  N ]    Palpitations Aqua.Slicker  ] Syncope  [ N ]   Presyncope [   N]  General Review of Systems: [Y] = yes [ N ]=no Constitional:  fatigue [  ]; nausea Aqua.Slicker  ]; night sweats [ N ]; fever [N  ]; or chills Aqua.Slicker  ]                                                              Eye : blurred vision Aqua.Slicker  ];  Amaurosis fugax[N ]; Resp: cough [ N ];  wheezing[ N ];  hemoptysis[ N ];  GI:  vomiting[N  ];  dysphagia[N  ]; melena[N  ];  hematochezia [  N];  GU:  hematuria[ N ];                Skin: peripheral edema[ N ];   Heme/Lymph: anemia[Y  ];  Neuro: TIA[ N ];    stroke[ N ];  vertigo[N  ];  seizures[ N ];     Endocrine: diabetes[ Y ];  thyroid dysfunction[ N ];             Physical Exam: BP 128/72 (BP Location: Right Arm)   Pulse 94   Temp 99.3 F (37.4 C) (Oral)   Resp 19   Wt 90 kg   SpO2 98%   BMI 26.92 kg/m   General appearance: alert, cooperative and no distress Head: Normocephalic, without obvious abnormality, atraumatic Neck: no carotid bruit and supple, symmetrical, trachea midline Cardio: RRR, no murmur GI: Soft, non tender, bowel sounds present Extremities: No LE edema. Left upper arm fistula with bruit and thrill Neurologic: Grossly normal  Diagnostic Studies & Laboratory data:                             *[facility]*                          [address], [place] [address]                            [phone]  ------------------------------------------------------------------- Transthoracic Echocardiography  Patient:    Wyland, Rastetter MR #:       [id number] Study Date: 09/01/2018 Gender:     M Age:        56 Height:     182.9 cm Weight:     86.2 kg BSA:        2.1 m^2 Pt. Status: Room:       Tillar, Ali 193790  [id number]     [id number], AST [id number]  Wyvonnia Dusky [id number]  [place], [place], Inpatient  SONOGRAPHER  Mikki Santee  cc:  ------------------------------------------------------------------- LV EF: 55% -   60%  ------------------------------------------------------------------- Indications:      Abnormal EKG 794.31.  ------------------------------------------------------------------- History:   PMH:   Congestive heart failure.  Risk factors:  Alcohol abuse. Hypertension.  ------------------------------------------------------------------- Study Conclusions  - Left ventricle: The cavity size was normal. Systolic function was   normal. The estimated ejection fraction was in the range of 55%   to 60%. Wall motion was normal; there were no regional wall   motion abnormalities. Doppler parameters are consistent with   abnormal left ventricular relaxation (grade 1 diastolic   dysfunction). - Mitral valve: There was mild regurgitation. - Left atrium: The atrium was mildly dilated. - Atrial septum: There was a probable, small patent foramen ovale.  ------------------------------------------------------------------- Study data:  Comparison was made to the study of 03/09/2018.  Study status:  Routine.  Procedure:  The patient reported no pain pre or post test. Transthoracic echocardiography. Image quality was adequate.  Study completion:  There were no  complications. Transthoracic echocardiography.  M-mode, complete 2D, spectral Doppler, and color Doppler.  Birthdate:  Patient birthdate: Oct 23, 1956.  Age:  Patient is 62 yr old.  Sex:  Gender: male. BMI: 25.8 kg/m^2.  Blood pressure:     153/77  Patient status: Inpatient.  Study date:  Study date: 09/01/2018. Study time: 02:49 PM.  Location:  Echo laboratory.  -------------------------------------------------------------------  ------------------------------------------------------------------- Left ventricle:  The cavity size was normal. Systolic function was normal. The estimated ejection fraction was in the range of 55% to 60%. Wall motion was normal; there were no regional wall motion abnormalities. Doppler parameters are consistent with abnormal left ventricular relaxation (grade 1 diastolic dysfunction).  ------------------------------------------------------------------- Aortic valve:   Mildly calcified leaflets. Cusp separation was normal.  Doppler:  Transvalvular velocity was within the normal range. There was no stenosis. There was no regurgitation.  ------------------------------------------------------------------- Aorta:  Aortic root: The aortic root was normal in size. Ascending aorta: The ascending aorta was normal in size.  ------------------------------------------------------------------- Mitral valve:   Mildly thickened leaflets . Leaflet separation was normal.  Doppler:  Transvalvular velocity was within the normal range. There was no evidence for stenosis. There was mild regurgitation.    Valve area by pressure half-time: 2.75 cm^2. Indexed valve area by pressure half-time: 1.31 cm^2/m^2.  ------------------------------------------------------------------- Left atrium:  The atrium was mildly dilated.  ------------------------------------------------------------------- Atrial septum:  There was a probable, small patent foramen ovale.    ------------------------------------------------------------------- Right ventricle:  The cavity size was normal. Wall thickness was normal. Systolic function was normal.  ------------------------------------------------------------------- Pulmonic valve:    The valve appears to be grossly normal. Doppler:  There was trivial regurgitation.  ------------------------------------------------------------------- Tricuspid valve:   Structurally normal valve.   Leaflet separation was normal.  Doppler:  Transvalvular velocity was within the normal range. There was trivial regurgitation.  ------------------------------------------------------------------- Right atrium:  The atrium was normal in size.  ------------------------------------------------------------------- Pericardium:  There was no pericardial effusion.  ------------------------------------------------------------------- Systemic veins: Inferior vena cava: The vessel was normal in size. The respirophasic diameter changes were in the normal range (>= 50%), consistent with normal central venous pressure. End, Harrell Gave, MD (Primary)    Procedures   LEFT HEART CATH AND CORONARY ANGIOGRAPHY  Conclusion   Conclusions: 1. Severe multivessel coronary artery disease involving the distal LMCA, ostial/proximal LAD, ostial and proximal LCx, and mid RCA. 2. Normal left ventricular filling pressure. 3. Scattered atherosclerotic calcification of the ascending aorta.  Recommendations: 1. Cardiac surgery consultation for CABG.  CT chest may be helpful to further assess degree of aortic calcification. 2. Initiate heparin infusion 8 hours after sheath removal. 3. Aggressive secondary prevention, including indefinite aspirin 81 mg daily and high-intensity statin therapy. 4. Plan for hemodialysis tomorrow.  Diagnostic  Dominance: Right  Left Main  Vessel is large.  Mid LM to Dist LM lesion 70% stenosed with side branch in Ost Cx  to Prox Cx 70% stenosed  Mid LM to Dist LM lesion is 70% stenosed with 70% stenosed side branch in Ost Cx to Prox Cx.  Left Anterior Descending  Vessel is large.  Ost LAD lesion 90% stenosed  Ost LAD lesion is 90% stenosed. The lesion is calcified.  Mid LAD lesion 25% stenosed  Mid LAD lesion is 25% stenosed. The lesion is eccentric.  First Diagonal Branch  Vessel is moderate in size.  Second Diagonal Branch  Vessel is moderate in size.  Ost 2nd Diag lesion 25% stenosed  Ost 2nd Diag lesion is 25% stenosed.  Third Diagonal Branch  Vessel is small in  size.  Left Circumflex  Prox Cx lesion 70% stenosed  Prox Cx lesion is 70% stenosed.  First Obtuse Marginal Branch  Vessel is large in size.  Second Obtuse Marginal Branch  Vessel is small in size.  Third Obtuse Marginal Branch  Vessel is small in size.  Right Coronary Artery  Vessel is moderate in size. There is mild diffuse disease throughout the vessel.  Mid RCA lesion 75% stenosed  Mid RCA lesion is 75% stenosed. The lesion is focal.  Right Posterior Descending Artery  Vessel is moderate in size.  RPDA lesion 50% stenosed  RPDA lesion is 50% stenosed.  Right Posterior Atrioventricular Branch  Vessel is moderate in size.  Intervention   No interventions have been documented.  Left Heart   Left Ventricle LV end diastolic pressure is normal.  Aortic Valve There is no aortic valve stenosis.  Coronary Diagrams   Diagnostic Diagram           Recent Radiology Findings:   Dg Chest Port 1 View  Result Date: 09/01/2018 CLINICAL DATA:  Left-sided chest pain. EXAM: PORTABLE CHEST 1 VIEW COMPARISON:  08/11/2018 and 08/10/2018 FINDINGS: Heart size and pulmonary vascularity are normal the lungs are clear. Calcification and slight tortuosity of the thoracic aorta. No appreciable effusions. No significant bone abnormality. IMPRESSION: No acute abnormality. Aortic Atherosclerosis (ICD10-I70.0). Electronically Signed   By: Lorriane Shire M.D.   On: 09/01/2018 11:05     I have independently reviewed the above radiologic studies and discussed with the patient   Recent Lab Findings: Lab Results  Component Value Date   WBC 6.6 09/03/2018   HGB 10.5 (L) 09/03/2018   HCT 31.8 (L) 09/03/2018   PLT 250 09/03/2018   GLUCOSE 86 09/02/2018   ALT 9 07/20/2018   AST 12 (L) 07/20/2018   NA 137 09/02/2018   K 5.5 (H) 09/02/2018   CL 94 (L) 09/02/2018   CREATININE 13.12 (H) 09/02/2018   BUN 76 (H) 09/02/2018   CO2 27 09/02/2018   TSH 0.825 12/03/2017   INR 1.15 03/09/2018   HGBA1C 5.1 12/08/2013   Assessment / Plan:   1. Coronary artery disease-Dr. Prescott Gum to determine if a candidate for coronary artery bypass grafting surgery. Patient tentatively scheduled for next Friday 09/10/2018. Continue pre op work up and Dr. Prescott Gum will evaluate 2. History of ESRD-Creatinine today 13.12. HD days are T,TH,Sat 3. History of hypertension-on Carvedilol 3.125 mg bid and Isosorbide 30 mg daily 4. History of alcohol abuse-on multivitamin and Thiamine 5. Anemia of chronic disease-H and H 10.56 and 31.8 this am. On Lanthanum 6. Hyperlipidemia-on Atorvastatin 80 mg at hs 7. MBD-on Calcitriol and Cinacalcet  I  spent 20 minutes counseling the patient face to face.   Lars Pinks PA-C 09/03/2018 9:07 AM  Patient examined, images of most recent cath and echocardiogram personally reviewed.  Patient with recent non-STEMI found to have significant three-vessel CAD and evaluation for CABG recommended.  The patient's LV systolic function currently is only mildly reduced--in the past he has had probably alcohol induced nonischemic cardiomyopathy.  Patient has adequate targets for grafting and adequate LV function.  However his comorbid medical problems would preclude benefit from sternotomy and cardiopulmonary bypass.  Patient has renal failure on dialysis, history of multiple bilateral cerebral infarcts with advanced cerebral atrophy  on most recent CT scan, patient has been a heavy smoker and has very poor mechanics of breathing and would poorly tolerate sternotomy.  Although surgical revascularization would improve  his cardiac status the operation would result in further severe problems with multiorgan failure-dysfunction.  Recommend consideration of PCI.  I discussed this plan with the patient.

## 2018-09-03 NOTE — Progress Notes (Signed)
ANTICOAGULATION CONSULT NOTE  Pharmacy Consult for heparin Indication: chest pain/ACS  No Known Allergies  Patient Measurements: Weight: 198 lb 8 oz (90 kg) Heparin Dosing Weight: 90kg  Vital Signs: Temp: 97.6 F (36.4 C) (11/08 1206) Temp Source: Oral (11/08 1206) BP: 116/65 (11/08 1206) Pulse Rate: 84 (11/08 1206)  Labs: Recent Labs    09/01/18 1043 09/01/18 1050 09/01/18 1406 09/01/18 1915 09/02/18 0229 09/02/18 0710 09/03/18 0435 09/03/18 0823 09/03/18 1623  HGB 10.9* 10.9*  --   --   --  11.2* 10.5*  --   --   HCT 32.6* 32.0*  --   --   --  32.6* 31.8*  --   --   PLT 218  --   --   --   --  198 250  --   --   HEPARINUNFRC  --   --   --   --   --   --   --  0.28* 0.38  CREATININE 10.82* 12.00*  --   --   --  13.12*  --   --   --   TROPONINI  --   --  0.03* 0.03* 0.03*  --   --   --   --     Estimated Creatinine Clearance: 6.2 mL/min (A) (by C-G formula based on SCr of 13.12 mg/dL (H)).   Assessment: 89 yoM with ESRD and HTN admitted with atypical CP now s/p LHC. Pt found to have significant 3-vessel CAD and will need surgical consult.   Heparin level is now therapeutic; no bleeding reported.  Goal of Therapy:  Heparin level 0.3-0.7 units/ml Monitor platelets by anticoagulation protocol: Yes   Plan:  Continue heparin gtt at 1150 units/hr F/U AM labs  Jaedan Schuman D. Mina Marble, PharmD, BCPS, [place] 09/03/2018, 5:41 PM

## 2018-09-03 NOTE — Progress Notes (Addendum)
Progress Note  Patient Name: Kenneth [name] Date of Encounter: 09/03/2018  Primary Cardiologist: Peter [name], MD  Subjective   Pt underwent cath 09/02/18 found to have severe three vessel disease with LM involvement. TCTS consultation underway. Denies chest pain this AM   Inpatient Medications    Scheduled Meds: . aspirin EC  81 mg Oral Daily  . atorvastatin  40 mg Oral q1800  . calcitRIOL  0.75 mcg Oral Q T,Th,Sa-HD  . carvedilol  3.125 mg Oral BID WC  . Chlorhexidine Gluconate Cloth  6 each Topical Q0600  . cinacalcet  60 mg Oral Q T,Th,Sa-HD  . divalproex  500 mg Oral BID  . folic acid  1 mg Oral Daily  . isosorbide mononitrate  30 mg Oral Daily  . lanthanum  2,000 mg Oral TID WC  . multivitamin with minerals  1 tablet Oral Daily  . sodium chloride flush  3 mL Intravenous Q12H  . thiamine  100 mg Oral Daily   Continuous Infusions: . sodium chloride    . sodium chloride    . sodium chloride    . heparin 1,050 Units/hr (09/03/18 0800)   PRN Meds: sodium chloride, sodium chloride, sodium chloride, heparin, heparin, HYDROmorphone (DILAUDID) injection, lidocaine (PF), lidocaine-prilocaine, LORazepam **OR** LORazepam, nitroGLYCERIN, ondansetron **OR** ondansetron (ZOFRAN) IV, pentafluoroprop-tetrafluoroeth, sodium chloride flush, zolpidem   Vital Signs    Vitals:   09/02/18 1750 09/02/18 1845 09/02/18 2003 09/03/18 0522  BP: (!) 157/88 136/80 (!) 157/83 128/72  Pulse: 78 91 87 94  Resp:   20 19  Temp: 98 F (36.7 C) 98.4 F (36.9 C) 98.6 F (37 C) 99.3 F (37.4 C)  TempSrc: Oral Oral Oral Oral  SpO2: 100% 95% 100% 98%  Weight:    90 kg    Intake/Output Summary (Last 24 hours) at 09/03/2018 0929 Last data filed at 09/03/2018 0800 Gross per 24 hour  Intake 557.84 ml  Output 2058 ml  Net -1500.16 ml   Filed Weights   09/02/18 0800 09/02/18 1235 09/03/18 0522  Weight: 93.3 kg 90.6 kg 90 kg   Physical Exam   General: Well developed, well nourished,  NAD Skin: Warm, dry, intact Head: Normocephalic, atraumatic, clear, moist mucus membranes. Neck: Negative for carotid bruits. No JVD Lungs:Clear to ausculation bilaterally. Breathing is unlabored. Cardiovascular: RRR with S1 S2. No murmurs, rubs, gallops, or LV heave appreciated. Abdomen: Soft, non-tender, non-distended with normoactive bowel sounds. No obvious abdominal masses. MSK: Strength and tone appear normal for age. 5/5 in all extremities Extremities: No edema. No clubbing or cyanosis. DP/PT pulses 2+ bilaterally Neuro: Alert and oriented. No focal deficits. No facial asymmetry. MAE spontaneously. Psych: Responds to questions appropriately with normal affect.    Labs    Chemistry Recent Labs  Lab 09/01/18 1043 09/01/18 1050 09/02/18 0710  NA 138 138 137  K 4.0 4.0 5.5*  CL 94* 95* 94*  CO2 29  --  27  GLUCOSE 88 87 86  BUN 60* 55* 76*  CREATININE 10.82* 12.00* 13.12*  CALCIUM 8.5*  --  8.5*  ALBUMIN  --   --  3.3*  GFRNONAA 4*  --  3*  GFRAA 5*  --  4*  ANIONGAP 15  --  16*     Hematology Recent Labs  Lab 09/01/18 1043 09/01/18 1050 09/02/18 0710 09/03/18 0435  WBC 7.2  --  6.8 6.6  RBC 3.33*  --  3.38* 3.28*  HGB 10.9* 10.9* 11.2* 10.5*  HCT 32.6*  32.0* 32.6* 31.8*  MCV 97.9  --  96.4 97.0  MCH 32.7  --  33.1 32.0  MCHC 33.4  --  34.4 33.0  RDW 14.4  --  14.2 13.9  PLT 218  --  198 250    Cardiac Enzymes Recent Labs  Lab 09/01/18 1406 09/01/18 1915 09/02/18 0229  TROPONINI 0.03* 0.03* 0.03*    Recent Labs  Lab 09/01/18 1048  TROPIPOC 0.04     Lipid Panel  No results found for: CHOL, TRIG, HDL, CHOLHDL, VLDL, LDLCALC, LDLDIRECT BNPNo results for input(s): BNP, PROBNP in the last 168 hours.   DDimer No results for input(s): DDIMER in the last 168 hours.   Radiology    Dg Chest Port 1 View  Result Date: 09/01/2018 CLINICAL DATA:  Left-sided chest pain. EXAM: PORTABLE CHEST 1 VIEW COMPARISON:  08/11/2018 and 08/10/2018 FINDINGS: Heart  size and pulmonary vascularity are normal the lungs are clear. Calcification and slight tortuosity of the thoracic aorta. No appreciable effusions. No significant bone abnormality. IMPRESSION: No acute abnormality. Aortic Atherosclerosis (ICD10-I70.0). Electronically Signed   By: Lorriane Shire M.D.   On: 09/01/2018 11:05   Telemetry    09/03/18 NSR- Personally Reviewed  ECG    No new tracing as of 09/03/18 - Personally Reviewed  Cardiac Studies   2D Echo 03/09/18  Study Conclusions  - Left ventricle: The cavity size was normal. There was severe concentric hypertrophy. Systolic function was normal. Wall motion was normal; there were no regional wall motion abnormalities. Features are consistent with a pseudonormal left ventricular filling pattern, with concomitant abnormal relaxation and increased filling pressure (grade 2 diastolic dysfunction). Doppler parameters are consistent with elevated ventricular end-diastolic filling pressure. - Aortic valve: Probably trileaflet; moderately thickened, moderately calcified leaflets. - Mitral valve: Calcified annulus. Moderately thickened, mildly calcified leaflets . There was mild regurgitation. - Left atrium: The atrium was mildly dilated. - Right ventricle: The cavity size was moderately dilated. Wall thickness was normal. Systolic function was moderately reduced. - Right atrium: The atrium was normal in size. - Tricuspid valve: There was mild regurgitation. - Pulmonary arteries: Systolic pressure was within the normal range. - Inferior vena cava: The vessel was normal in size. - Pericardium, extracardiac: There was no pericardial effusion.  Impressions:  - Severe concentric LVH, LVEF 55-60%. Grade 2 diastolic dysfunction. Moderately decreased RV systolic function. Thickened aortic and mitral valves. Consider evaluation for cardiac amyloidosis including PYP scan (nuclear scan for evaluation of  TTR amyloidosis).   Cardiac catheterization 09/02/18:  Conclusions: 1. Severe multivessel coronary artery disease involving the distal LMCA, ostial/proximal LAD, ostial and proximal LCx, and mid RCA. 2. Normal left ventricular filling pressure. 3. Scattered atherosclerotic calcification of the ascending aorta.  Recommendations: 1. Cardiac surgery consultation for CABG.  CT chest may be helpful to further assess degree of aortic calcification. 2. Initiate heparin infusion 8 hours after sheath removal. 3. Aggressive secondary prevention, including indefinite aspirin 81 mg daily and high-intensity statin therapy. 4. Plan for hemodialysis tomorrow.  Patient Profile     62 y.o. male with a hx of cardiomyopathy w/ prior EF of 25-30%, felt to be NICM given low risk Myoview in 2017, EF has since normalized on subsequent echos, diastolic HF, h/o heavy ETHO use, NSVT, atrial fibrillation/flutter previously felt to be a poor candidate for anticoagulation due to poor compliance and ETHO use, ESRD on HD (Tu,Th,Sa) and HTN, who is being seen today for evaluation for CP and abnormal EKG, at the request of  Dr. Laren Everts, Internal Medicine.   Assessment & Plan    1. Chest Pain: -Pt presented with mixed typical and atypical features including recent intermitted left sided pressure-like discomfort with occasional radiation to the left arm, ~10 min duration with mild relief with SL NTG -Stress test in 2017 that was negative for ischemia -Abnormal EKG 09/01/18 showing new inferior and lateral TWIs>>>repeat EKG from 09/02/18 with worsening T wave inversion and ST depression in anterolateral leads>>subsequent cath completed 09/02/18 showing 3-vessel CAD with recommendations for TCTS consultation   -Echocardiogram with normal LVEF NWMA and G1DD, similar to prior studies -Trop, 0.03>0.03>0.03 -Continue ASA, statin, carvedilol  2. ESRD: -HD Tu, Th, Sat -Per nephrology   4. HTN: -Stable,  128/72>157/83>136/80 -Continue carvedilol    Signed, Kathyrn Drown NP-C [facility] Pager: [phone] 09/03/2018, 9:29 AM      Patient seen and examined. Agree with assessment and plan.  Angiograms reviewed.  Severe coronary obstructive disease with high-grade left main ostial proximal LAD and circumflex in addition to mid RCA.  EF 55 to 60% on echo grade 2 diastolic dysfunction.  No recurrent anginal symptomatology.  We will repeat ECG today.  Pulses in the 80s.  Will titrate carvedilol to 6.25 mg twice a day.  Continue heparin therapy.  Patient is also on isosorbide.  Thoracic surgery to see later today.  Increase atorvastatin to 80 mg.  Check lipid panel.  Patient dialysis days are Tuesday, Thursday, and Saturday.   Troy Sine, MD, [facility] 09/03/2018 11:56 AM  For questions or updates, please contact   Please consult [email or web address] for contact info under Cardiology/STEMI.

## 2018-09-03 NOTE — Progress Notes (Signed)
[facility] Progress Note   Subjective:   CP yesterday relieved with NTG + EKG changes --> LHC --> significant CAD.  Plan CABG 11/15.   Objective Vitals:   09/02/18 1750 09/02/18 1845 09/02/18 2003 09/03/18 0522  BP: (!) 157/88 136/80 (!) 157/83 128/72  Pulse: 78 91 87 94  Resp:   20 19  Temp: 98 F (36.7 C) 98.4 F (36.9 C) 98.6 F (37 C) 99.3 F (37.4 C)  TempSrc: Oral Oral Oral Oral  SpO2: 100% 95% 100% 98%  Weight:    90 kg   Physical Exam General: sleepy, comfortable Heart: RRR, no rub Lungs: clear to bases Abdomen: soft Extremities: no edema Dialysis Access:  LUE AVF +t/b  Additional Objective Labs: Basic Metabolic Panel: Recent Labs  Lab 09/01/18 1043 09/01/18 1050 09/02/18 0710  NA 138 138 137  K 4.0 4.0 5.5*  CL 94* 95* 94*  CO2 29  --  27  GLUCOSE 88 87 86  BUN 60* 55* 76*  CREATININE 10.82* 12.00* 13.12*  CALCIUM 8.5*  --  8.5*  PHOS  --   --  8.5*   Liver Function Tests: Recent Labs  Lab 09/02/18 0710  ALBUMIN 3.3*   No results for input(s): LIPASE, AMYLASE in the last 168 hours. CBC: Recent Labs  Lab 09/01/18 1043 09/01/18 1050 09/02/18 0710 09/03/18 0435  WBC 7.2  --  6.8 6.6  HGB 10.9* 10.9* 11.2* 10.5*  HCT 32.6* 32.0* 32.6* 31.8*  MCV 97.9  --  96.4 97.0  PLT 218  --  198 250   Blood Culture    Component Value Date/Time   SDES URINE, CLEAN CATCH 05/12/2016 0824   SPECREQUEST NONE 05/12/2016 0824   CULT (A) 05/12/2016 0824    7,000 COLONIES/mL INSIGNIFICANT GROWTH Performed at [place] [facility] 05/13/2016 FINAL 05/12/2016 0824    Cardiac Enzymes: Recent Labs  Lab 09/01/18 1406 09/01/18 1915 09/02/18 0229  TROPONINI 0.03* 0.03* 0.03*   CBG: No results for input(s): GLUCAP in the last 168 hours. Iron Studies: No results for input(s): IRON, TIBC, TRANSFERRIN, FERRITIN in the last 72 hours. @lablastinr3 @ Studies/Results: Dg Chest Port 1 View  Result Date: 09/01/2018 CLINICAL DATA:   Left-sided chest pain. EXAM: PORTABLE CHEST 1 VIEW COMPARISON:  08/11/2018 and 08/10/2018 FINDINGS: Heart size and pulmonary vascularity are normal the lungs are clear. Calcification and slight tortuosity of the thoracic aorta. No appreciable effusions. No significant bone abnormality. IMPRESSION: No acute abnormality. Aortic Atherosclerosis (ICD10-I70.0). Electronically Signed   By: Lorriane Shire M.D.   On: 09/01/2018 11:05   Medications: . sodium chloride    . sodium chloride    . sodium chloride    . heparin 1,150 Units/hr (09/03/18 1028)   . aspirin EC  81 mg Oral Daily  . atorvastatin  40 mg Oral q1800  . calcitRIOL  0.75 mcg Oral Q T,Th,Sa-HD  . carvedilol  3.125 mg Oral BID WC  . Chlorhexidine Gluconate Cloth  6 each Topical Q0600  . cinacalcet  60 mg Oral Q T,Th,Sa-HD  . divalproex  500 mg Oral BID  . folic acid  1 mg Oral Daily  . isosorbide mononitrate  30 mg Oral Daily  . lanthanum  2,000 mg Oral TID WC  . multivitamin with minerals  1 tablet Oral Daily  . sodium chloride flush  3 mL Intravenous Q12H  . thiamine  100 mg Oral Daily    Outpt HD orders: [facility] TTS 4.25hrs 180, 400/800,  2/2, 86.5 Last HD 11/5 - pre wt 96kg, UF 3.4, post weight 92.6 ran full tx. Post weights over past month - lowest was 88.8kg  Assessment/Plan: 1 Chest pain: Now on ACS heparin, 3v disease on LHC, plans for CABG 11/5.  2 ESRD: HD today per outpt schedule.  EDW 96kg, hasn't been achieving.  Tol UF to 90kg yesterday.  Will try 2.5L yesterday, tol 2.1 yesterday.   3. Anemia of ESRD: last hb 10.5, no ESA outpatient.  If < 10 start ESA in prep for CABG 4. Metabolic Bone Disease: corr ca running low - dec'd sensipar from 90 to 60s, 2.5ca dialysate, continue calcitriol 0.75 per outpt.  Phos runs in the 7-8s - renal diet here, fosrenol 2g TID per outpatient dose > phos is 8.5 today. F/u labs.   Jannifer Hick MD 09/03/2018, 10:36 AM  [facility] Pager: [phone]

## 2018-09-03 NOTE — Progress Notes (Addendum)
ANTICOAGULATION CONSULT NOTE  Pharmacy Consult for heparin Indication: chest pain/ACS  No Known Allergies  Patient Measurements: Weight: 198 lb 8 oz (90 kg) Heparin Dosing Weight: 90kg  Vital Signs: Temp: 99.3 F (37.4 C) (11/08 0522) Temp Source: Oral (11/08 0522) BP: 128/72 (11/08 0522) Pulse Rate: 94 (11/08 0522)  Labs: Recent Labs    09/01/18 1043 09/01/18 1050 09/01/18 1406 09/01/18 1915 09/02/18 0229 09/02/18 0710 09/03/18 0435 09/03/18 0823  HGB 10.9* 10.9*  --   --   --  11.2* 10.5*  --   HCT 32.6* 32.0*  --   --   --  32.6* 31.8*  --   PLT 218  --   --   --   --  198 250  --   HEPARINUNFRC  --   --   --   --   --   --   --  0.28*  CREATININE 10.82* 12.00*  --   --   --  13.12*  --   --   TROPONINI  --   --  0.03* 0.03* 0.03*  --   --   --     Estimated Creatinine Clearance: 6.2 mL/min (A) (by C-G formula based on SCr of 13.12 mg/dL (H)).   Medical History: Past Medical History:  Diagnosis Date  . Acute CHF ([place]) 01/2018  . Cardiomyopathy secondary    likely related to HTN heart disease; possibly ETOH related as well  . Chronic combined systolic and diastolic heart failure (HCC)    Echocardiogram 09/22/11: Moderate LVH, EF 82-64%, grade 3 diastolic dysfunction, mild MR, moderate to severe LAE, mild RVE, mild to moderate TR, small to moderate pericardial effusion  . ESRD (end stage renal disease) on dialysis ([place])    due to hypertensive nephrosclerosis; TTS; [address] (09/01/2018)  . History of alcohol abuse   . Hypertension      Assessment: 56 yoM with ESRD and HTN admitted with atypical CP now s/p LHC. Pt found to have significant 3-vessel CAD and will need surgical consult.   Heparin level is slightly subtherapeutic at 0.28, on 1050 units/hr. Hgb 10.5, plt 250. No s/sx of bleeding. No infusion issues- being   Goal of Therapy:  Heparin level 0.3-0.7 units/ml Monitor platelets by anticoagulation protocol: Yes   Plan:  -Increase heparin 1150  units/hr  -Check 8hr heparin level -Monitor daily HL, CBC, s/sx of bleeding.  Doylene Canard, PharmD Clinical Pharmacist  Pager: [phone] Phone: [phone] Please check AMION for all [place] numbers 09/03/2018

## 2018-09-03 NOTE — Progress Notes (Signed)
PROGRESS NOTE  Kenneth [name] PJA:[id number] DOB: 1956-03-18 DOA: 09/01/2018 PCP: [name], MD  HPI/Recap of past 24 hours: 62 y.o.[name] a hx of cardiomyopathy w/ prior EF of 25-30%, felt to be NICM given low risk Myoview in 2017, EF has since normalized on subsequent echos, diastolic HF, h/o heavy ETHO use, NSVT, atrial fibrillation/flutter previously felt to be a poor candidate for anticoagulation due to poor compliance and ETHO use, ESRD on HD (Tu,Th,Sa) and HTN admitted on 09/01/2018 with intermittent chest pains and found to have severe obstructive CAD, currently awaiting CT surgery consult for possible CABG, on IV heparin.  09/03/2018: [name] seen and examined at his bedside.  He denies chest pain, palpitations or dyspnea at rest.  Cardiology, CTS, and nephrology consulted and following.  CTS will make a decision whether the [name] is a candidate for CABG.  Tentatively scheduled for next Friday, 09/10/2018.  Dr. Prescott Gum to evaluate.  Assessment/Plan: Active Problems:   Chronic diastolic CHF (congestive heart failure) (HCC)   Alcoholism (HCC)   ACS (acute coronary syndrome) (HCC)   Unstable angina (HCC)  Chest pain, ACS ruled in, unstable angina Troponin remained flat at 0.03 Now chest pain-free Plan for possible CABG next Friday, 09/10/2018 Etiology N CTS following  Chronic diastolic CHF 2D echo done on 09/01/2018 revealed preserved LVEF 55 to 60% with grade 1 diastolic dysfunction with no regional wall motion abnormality and possibly small patent foramen ovale  End-stage renal disease on dialysis Tuesday Thursday and Saturday HD yesterday Plan for dialysis tomorrow 09/04/2018  Hx of p-Afib Not a good candidate for anticoagulation due to alcohol abuse and noncompliance\ On IV heparin in anticipation for possible CABG  Hypertension Blood pressure stable Continue cardiac medications On carvedilol  History of alcohol abuse Continue folic acid and  thiamine CIWA protocol in place    Code Status: full   Family Communication: none at bedside  Disposition Plan: home when clinically stable   Consultants:  Nephrology\  CT surgery  cardiology   Procedures:  none  Antimicrobials:   none  DVT prophylaxis:  iv heparin   Objective: Vitals:   09/02/18 1845 09/02/18 2003 09/03/18 0522 09/03/18 1206  BP: 136/80 (!) 157/83 128/72 116/65  Pulse: 91 87 94 84  Resp:  20 19 20   Temp: 98.4 F (36.9 C) 98.6 F (37 C) 99.3 F (37.4 C) 97.6 F (36.4 C)  TempSrc: Oral Oral Oral Oral  SpO2: 95% 100% 98% 97%  Weight:   90 kg     Intake/Output Summary (Last 24 hours) at 09/03/2018 1449 Last data filed at 09/03/2018 1300 Gross per 24 hour  Intake 847.95 ml  Output 0 ml  Net 847.95 ml   Filed Weights   09/02/18 0800 09/02/18 1235 09/03/18 0522  Weight: 93.3 kg 90.6 kg 90 kg    Exam:  . General: 62 y.o. year-old Kenneth well developed well nourished in no acute distress.  Alert and oriented x3. . Cardiovascular: Regular rate and rhythm with no rubs or gallops.  No thyromegaly or JVD noted.   Marland Kitchen Respiratory: Rales at bases with no wheezes.. Good inspiratory effort. . Abdomen: Soft nontender nondistended with normal bowel sounds x4 quadrants. . Musculoskeletal: No lower extremity edema. 2/4 pulses in all 4 extremities. . Skin: No ulcerative lesions noted or rashes.  Left upper extremity fistula. Marland Kitchen Psychiatry: Mood is appropriate for condition and setting   Data Reviewed: CBC: Recent Labs  Lab 09/01/18 1043 09/01/18 1050 09/02/18 0710 09/03/18 0435  WBC 7.2  --  6.8 6.6  HGB 10.9* 10.9* 11.2* 10.5*  HCT 32.6* 32.0* 32.6* 31.8*  MCV 97.9  --  96.4 97.0  PLT 218  --  198 606   Basic Metabolic Panel: Recent Labs  Lab 09/01/18 1043 09/01/18 1050 09/02/18 0710  NA 138 138 137  K 4.0 4.0 5.5*  CL 94* 95* 94*  CO2 29  --  27  GLUCOSE 88 87 86  BUN 60* 55* 76*  CREATININE 10.82* 12.00* 13.12*  CALCIUM 8.5*   --  8.5*  PHOS  --   --  8.5*   GFR: Estimated Creatinine Clearance: 6.2 mL/min (A) (by C-G formula based on SCr of 13.12 mg/dL (H)). Liver Function Tests: Recent Labs  Lab 09/02/18 0710  ALBUMIN 3.3*   No results for input(s): LIPASE, AMYLASE in the last 168 hours. No results for input(s): AMMONIA in the last 168 hours. Coagulation Profile: No results for input(s): INR, PROTIME in the last 168 hours. Cardiac Enzymes: Recent Labs  Lab 09/01/18 1406 09/01/18 1915 09/02/18 0229  TROPONINI 0.03* 0.03* 0.03*   BNP (last 3 results) No results for input(s): PROBNP in the last 8760 hours. HbA1C: No results for input(s): HGBA1C in the last 72 hours. CBG: No results for input(s): GLUCAP in the last 168 hours. Lipid Profile: No results for input(s): CHOL, HDL, LDLCALC, TRIG, CHOLHDL, LDLDIRECT in the last 72 hours. Thyroid Function Tests: No results for input(s): TSH, T4TOTAL, FREET4, T3FREE, THYROIDAB in the last 72 hours. Anemia Panel: No results for input(s): VITAMINB12, FOLATE, FERRITIN, TIBC, IRON, RETICCTPCT in the last 72 hours. Urine analysis:    Component Value Date/Time   COLORURINE YELLOW 05/12/2016 0834   APPEARANCEUR CLOUDY (A) 05/12/2016 0834   LABSPEC 1.014 05/12/2016 0834   PHURINE 5.0 05/12/2016 0834   GLUCOSEU NEGATIVE 05/12/2016 0834   HGBUR MODERATE (A) 05/12/2016 0834   BILIRUBINUR NEGATIVE 05/12/2016 0834   KETONESUR NEGATIVE 05/12/2016 0834   PROTEINUR 100 (A) 05/12/2016 0834   UROBILINOGEN 0.2 12/09/2013 0013   NITRITE NEGATIVE 05/12/2016 0834   LEUKOCYTESUR NEGATIVE 05/12/2016 0834   Sepsis Labs: @LABRCNTIP (procalcitonin:4,lacticidven:4)  ) Recent Results (from the past 240 hour(s))  MRSA PCR Screening     Status: None   Collection Time: 09/01/18  5:29 PM  Result Value Ref Range Status   MRSA by PCR NEGATIVE NEGATIVE Final    Comment:        The GeneXpert MRSA Assay (FDA approved for NASAL specimens only), is one component of  a comprehensive MRSA colonization surveillance program. It is not intended to diagnose MRSA infection nor to guide or monitor treatment for MRSA infections. Performed at [facility], [place] [address], [place], [place] [address]       Studies: No results found.  Scheduled Meds: . aspirin EC  81 mg Oral Daily  . atorvastatin  80 mg Oral q1800  . calcitRIOL  0.75 mcg Oral Q T,Th,Sa-HD  . carvedilol  6.25 mg Oral BID WC  . Chlorhexidine Gluconate Cloth  6 each Topical Q0600  . Chlorhexidine Gluconate Cloth  6 each Topical Q0600  . cinacalcet  60 mg Oral Q T,Th,Sa-HD  . divalproex  500 mg Oral BID  . folic acid  1 mg Oral Daily  . isosorbide mononitrate  30 mg Oral Daily  . lanthanum  2,000 mg Oral TID WC  . multivitamin with minerals  1 tablet Oral Daily  . sodium chloride flush  3 mL Intravenous Q12H  .  thiamine  100 mg Oral Daily    Continuous Infusions: . sodium chloride    . sodium chloride    . sodium chloride    . heparin 1,150 Units/hr (09/03/18 1300)     LOS: 2 days     Kayleen Memos, MD [facility] Pager [phone]  If 7PM-7AM, please contact night-coverage [email or web address] Password [facility] 09/03/2018, 2:49 PM

## 2018-09-04 LAB — LIPID PANEL
CHOL/HDL RATIO: 4.4 ratio
CHOLESTEROL: 222 mg/dL — AB (ref 0–200)
HDL: 50 mg/dL (ref >40)
LDL CALC: 153 mg/dL — AB (ref 0–99)
Triglycerides: 95 mg/dL (ref <150)
VLDL: 19 mg/dL (ref 0–40)

## 2018-09-04 LAB — CBC
HEMATOCRIT: 32.3 % — AB (ref 39.0–52.0)
Hemoglobin: 10.8 g/dL — ABNORMAL LOW (ref 13.0–17.0)
MCH: 32.5 pg (ref 26.0–34.0)
MCHC: 33.4 g/dL (ref 30.0–36.0)
MCV: 97.3 fL (ref 80.0–100.0)
NRBC: 0 % (ref 0.0–0.2)
Platelets: 230 10*3/uL (ref 150–400)
RBC: 3.32 MIL/uL — ABNORMAL LOW (ref 4.22–5.81)
RDW: 14 % (ref 11.5–15.5)
WBC: 7.5 10*3/uL (ref 4.0–10.5)

## 2018-09-04 LAB — RENAL FUNCTION PANEL
Albumin: 3.4 g/dL — ABNORMAL LOW (ref 3.5–5.0)
Anion gap: 16 — ABNORMAL HIGH (ref 5–15)
BUN: 75 mg/dL — AB (ref 8–23)
CO2: 24 mmol/L (ref 22–32)
CREATININE: 12.45 mg/dL — AB (ref 0.61–1.24)
Calcium: 8.6 mg/dL — ABNORMAL LOW (ref 8.9–10.3)
Chloride: 92 mmol/L — ABNORMAL LOW (ref 98–111)
GFR calc Af Amer: 4 mL/min — ABNORMAL LOW (ref >60)
GFR, EST NON AFRICAN AMERICAN: 4 mL/min — AB (ref >60)
GLUCOSE: 75 mg/dL (ref 70–99)
PHOSPHORUS: 5.9 mg/dL — AB (ref 2.5–4.6)
Potassium: 5 mmol/L (ref 3.5–5.1)
SODIUM: 132 mmol/L — AB (ref 135–145)

## 2018-09-04 LAB — HEPARIN LEVEL (UNFRACTIONATED): HEPARIN UNFRACTIONATED: 0.38 [IU]/mL (ref 0.30–0.70)

## 2018-09-04 MED ORDER — CALCITRIOL 0.5 MCG PO CAPS
ORAL_CAPSULE | ORAL | Status: AC
  Filled 2018-09-04: qty 1

## 2018-09-04 MED ORDER — POLYETHYLENE GLYCOL 3350 17 G PO PACK
17.0000 g | PACK | Freq: Every day | ORAL | Status: DC
  Administered 2018-09-04 – 2018-09-05 (×2): 17 g via ORAL
  Filled 2018-09-04 (×4): qty 1

## 2018-09-04 MED ORDER — SENNOSIDES-DOCUSATE SODIUM 8.6-50 MG PO TABS
1.0000 | ORAL_TABLET | Freq: Two times a day (BID) | ORAL | Status: DC
  Administered 2018-09-04 – 2018-09-05 (×2): 1 via ORAL
  Filled 2018-09-04 (×6): qty 1

## 2018-09-04 MED ORDER — CALCITRIOL 0.25 MCG PO CAPS
ORAL_CAPSULE | ORAL | Status: AC
  Filled 2018-09-04: qty 1

## 2018-09-04 NOTE — Progress Notes (Signed)
[facility] Progress Note   Subjective:   CT surgery rec PCI due to comorbids.  He is resting in bed on HD today without complaints  Objective Vitals:   09/03/18 0522 09/03/18 1206 09/03/18 1942 09/04/18 0525  BP: 128/72 116/65 132/69 (!) 119/55  Pulse: 94 84 82 66  Resp: 19 20 20 20   Temp: 99.3 F (37.4 C) 97.6 F (36.4 C) 97.8 F (36.6 C) 97.7 F (36.5 C)  TempSrc: Oral Oral Oral Oral  SpO2: 98% 97% 95% 99%  Weight: 90 kg   92.1 kg   Physical Exam General: sleepy, comfortable Heart: RRR, no rub Lungs: clear to bases Abdomen: soft Extremities: no edema Dialysis Access:  LUE AVF +t/b  Additional Objective Labs: Basic Metabolic Panel: Recent Labs  Lab 09/01/18 1043 09/01/18 1050 09/02/18 0710 09/04/18 0502  NA 138 138 137 132*  K 4.0 4.0 5.5* 5.0  CL 94* 95* 94* 92*  CO2 29  --  27 24  GLUCOSE 88 87 86 75  BUN 60* 55* 76* 75*  CREATININE 10.82* 12.00* 13.12* 12.45*  CALCIUM 8.5*  --  8.5* 8.6*  PHOS  --   --  8.5* 5.9*   Liver Function Tests: Recent Labs  Lab 09/02/18 0710 09/04/18 0502  ALBUMIN 3.3* 3.4*   No results for input(s): LIPASE, AMYLASE in the last 168 hours. CBC: Recent Labs  Lab 09/01/18 1043  09/02/18 0710 09/03/18 0435 09/04/18 0502  WBC 7.2  --  6.8 6.6 7.5  HGB 10.9*   < > 11.2* 10.5* 10.8*  HCT 32.6*   < > 32.6* 31.8* 32.3*  MCV 97.9  --  96.4 97.0 97.3  PLT 218  --  198 250 230   < > = values in this interval not displayed.   Blood Culture    Component Value Date/Time   SDES URINE, CLEAN CATCH 05/12/2016 0824   SPECREQUEST NONE 05/12/2016 0824   CULT (A) 05/12/2016 0824    7,000 COLONIES/mL INSIGNIFICANT GROWTH Performed at [facility] 05/13/2016 FINAL 05/12/2016 0824    Cardiac Enzymes: Recent Labs  Lab 09/01/18 1406 09/01/18 1915 09/02/18 0229  TROPONINI 0.03* 0.03* 0.03*   CBG: No results for input(s): GLUCAP in the last 168 hours. Iron Studies: No results for  input(s): IRON, TIBC, TRANSFERRIN, FERRITIN in the last 72 hours. @lablastinr3 @ Studies/Results: Vas US Doppler Pre Cabg  Result Date: 09/03/2018 PREOPERATIVE VASCULAR EVALUATION  Indications: PRE CABG. Performing Technologist: Landry Mellow RDMS, RVT  Examination Guidelines: A complete evaluation includes B-mode imaging, spectral Doppler, color Doppler, and power Doppler as needed of all accessible portions of each vessel. Bilateral testing is considered an integral part of a complete examination. Limited examinations for reoccurring indications may be performed as noted.  Right Carotid Findings: +----------+--------+--------+--------+------------+--------+           PSV cm/sEDV cm/sStenosisDescribe    Comments +----------+--------+--------+--------+------------+--------+ CCA Prox  129     12              heterogenous         +----------+--------+--------+--------+------------+--------+ CCA Distal94      12                                   +----------+--------+--------+--------+------------+--------+ ICA Prox  25      8       1-39%   heterogenous         +----------+--------+--------+--------+------------+--------+  ECA       73      3                                    +----------+--------+--------+--------+------------+--------+ Portions of this table do not appear on this page. +----------+--------+-------+------------+------------+           PSV cm/sEDV cmsDescribe    Arm Pressure +----------+--------+-------+------------+------------+ Subclavian               Not assessed130          +----------+--------+-------+------------+------------+ +---------+--------+--------+--------------+ VertebralPSV cm/sEDV cm/sNot identified +---------+--------+--------+--------------+ technically difficult due to patient movement Left Carotid Findings: +----------+--------+--------+--------+------------+--------+           PSV cm/sEDV cm/sStenosisDescribe    Comments  +----------+--------+--------+--------+------------+--------+ CCA Prox  86      15              heterogenous         +----------+--------+--------+--------+------------+--------+ CCA Distal44      6                                    +----------+--------+--------+--------+------------+--------+ ICA Prox  26      12      1-39%   heterogenous         +----------+--------+--------+--------+------------+--------+ ICA Distal41      14                                   +----------+--------+--------+--------+------------+--------+ ECA       142     14                                   +----------+--------+--------+--------+------------+--------+ +----------+--------+--------+------------+------------+ SubclavianPSV cm/sEDV cm/sDescribe    Arm Pressure +----------+--------+--------+------------+------------+                           Not assessed             +----------+--------+--------+------------+------------+ +---------+--------+--+--------+--+---------+ VertebralPSV cm/s35EDV cm/s12Antegrade +---------+--------+--+--------+--+---------+ technically difficult due to patient movement ABI Findings: +--------+------------------+-----+-----------+--------+ Right   Rt Pressure (mmHg)IndexWaveform   Comment  +--------+------------------+-----+-----------+--------+ Brachial130                    triphasic           +--------+------------------+-----+-----------+--------+ ATA                            not audible         +--------+------------------+-----+-----------+--------+ PTA     87                0.67 monophasic          +--------+------------------+-----+-----------+--------+ +--------+------------------+-----+-----------+------------------------------+ Left    Lt Pressure (mmHg)IndexWaveform   Comment                        +--------+------------------+-----+-----------+------------------------------+ Brachial                                   BP not taken due to AV fistula +--------+------------------+-----+-----------+------------------------------+ ATA  not audible                               +--------+------------------+-----+-----------+------------------------------+ PTA     74                0.57 monophasic                                +--------+------------------+-----+-----------+------------------------------+  Right Doppler Findings: +--------+--------+-----+---------+--------+ Site    PressureIndexDoppler  Comments +--------+--------+-----+---------+--------+ Brachial130          triphasic         +--------+--------+-----+---------+--------+ Radial               triphasic         +--------+--------+-----+---------+--------+ Ulnar                triphasic         +--------+--------+-----+---------+--------+  Left Doppler Findings: +--------+--------+-----+-------+------------------------------+ Site    PressureIndexDopplerComments                       +--------+--------+-----+-------+------------------------------+ Brachial                    BP not taken due to AV fistula +--------+--------+-----+-------+------------------------------+ Radial                      not assessed                   +--------+--------+-----+-------+------------------------------+ Ulnar                       not assessed                   +--------+--------+-----+-------+------------------------------+  Summary: Right Carotid: Velocities in the right ICA are consistent with a 1-39% stenosis. Left Carotid: Velocities in the left ICA are consistent with a 1-39% stenosis. Right ABI: Resting right ankle-brachial index indicates moderate right lower extremity arterial disease. Left ABI: Resting left ankle-brachial index indicates moderate left lower extremity arterial disease. Right Upper Extremity: Doppler waveforms decrease >50% with right radial compression. Doppler  waveforms remain within normal limits with right ulnar compression. Left Upper Extremity: Left not evaluated due to AV fistula.  Electronically signed by Monica Martinez MD on 09/03/2018 at 5:01:40 PM.    Final    Medications: . sodium chloride    . sodium chloride    . sodium chloride    . heparin 1,150 Units/hr (09/03/18 2124)   . aspirin EC  81 mg Oral Daily  . atorvastatin  80 mg Oral q1800  . calcitRIOL  0.75 mcg Oral Q T,Th,Sa-HD  . carvedilol  6.25 mg Oral BID WC  . Chlorhexidine Gluconate Cloth  6 each Topical Q0600  . Chlorhexidine Gluconate Cloth  6 each Topical Q0600  . cinacalcet  60 mg Oral Q T,Th,Sa-HD  . divalproex  500 mg Oral BID  . folic acid  1 mg Oral Daily  . isosorbide mononitrate  30 mg Oral Daily  . lanthanum  2,000 mg Oral TID WC  . multivitamin with minerals  1 tablet Oral Daily  . sodium chloride flush  3 mL Intravenous Q12H  . thiamine  100 mg Oral Daily    Outpt HD orders: [facility] TTS 4.25hrs 180, 400/800, 2/2, 86.5 Last HD 11/5 - pre wt 96kg, UF 3.4,  post weight 92.6 ran full tx. Post weights over past month - lowest was 88.8kg  Assessment/Plan: 1 Chest pain: Now on ACS heparin, 3v disease on LHC, PCI being considered.  2 ESRD: HD today per outpt schedule.  EDW 96kg, hasn't been achieving.  Tol UF to 90kg yesterday.  Will try 2.5L, tol 2.1 yesterday.   3. Anemia of ESRD: last hb 10.8, no ESA outpatient.  If < 10 start ESA . 4. Metabolic Bone Disease: corr ca ok - dec'd sensipar from 90 to 60s, 2.5ca dialysate, continue calcitriol 0.75 per outpt.  If phos remains in the 8s switch back to 2Ca bath. Phos runs in the 7-8s - renal diet here, fosrenol 2g TID per outpatient dose > phos is 5.9 today.   Jannifer Hick MD 09/04/2018, 10:06 AM  [facility] Pager: [phone]

## 2018-09-04 NOTE — Progress Notes (Signed)
Progress Note  Patient Name: Kenneth [name] Date of Encounter: 09/04/2018  Primary Cardiologist:   Peter [name], MD   Subjective   Chest pain last night.  Currently pain free.   Inpatient Medications    Scheduled Meds: . aspirin EC  81 mg Oral Daily  . atorvastatin  80 mg Oral q1800  . calcitRIOL  0.75 mcg Oral Q T,Th,Sa-HD  . carvedilol  6.25 mg Oral BID WC  . Chlorhexidine Gluconate Cloth  6 each Topical Q0600  . Chlorhexidine Gluconate Cloth  6 each Topical Q0600  . cinacalcet  60 mg Oral Q T,Th,Sa-HD  . divalproex  500 mg Oral BID  . folic acid  1 mg Oral Daily  . isosorbide mononitrate  30 mg Oral Daily  . lanthanum  2,000 mg Oral TID WC  . multivitamin with minerals  1 tablet Oral Daily  . sodium chloride flush  3 mL Intravenous Q12H  . thiamine  100 mg Oral Daily   Continuous Infusions: . sodium chloride    . sodium chloride    . sodium chloride    . heparin 1,150 Units/hr (09/03/18 2124)   PRN Meds: sodium chloride, sodium chloride, sodium chloride, heparin, heparin, HYDROmorphone (DILAUDID) injection, lidocaine (PF), lidocaine-prilocaine, LORazepam **OR** LORazepam, nitroGLYCERIN, ondansetron **OR** ondansetron (ZOFRAN) IV, pentafluoroprop-tetrafluoroeth, sodium chloride flush, zolpidem   Vital Signs    Vitals:   09/03/18 0522 09/03/18 1206 09/03/18 1942 09/04/18 0525  BP: 128/72 116/65 132/69 (!) 119/55  Pulse: 94 84 82 66  Resp: 19 20 20 20   Temp: 99.3 F (37.4 C) 97.6 F (36.4 C) 97.8 F (36.6 C) 97.7 F (36.5 C)  TempSrc: Oral Oral Oral Oral  SpO2: 98% 97% 95% 99%  Weight: 90 kg   92.1 kg    Intake/Output Summary (Last 24 hours) at 09/04/2018 0932 Last data filed at 09/04/2018 0700 Gross per 24 hour  Intake 848.04 ml  Output 0 ml  Net 848.04 ml   Filed Weights   09/02/18 1235 09/03/18 0522 09/04/18 0525  Weight: 90.6 kg 90 kg 92.1 kg    Telemetry    NSR, PVCs - Personally Reviewed  ECG    NA - Personally Reviewed  Physical  Exam   GEN: No acute distress.   Neck: No  JVD Cardiac: RRR, no murmurs, rubs, or gallops.  Respiratory: Clear  to auscultation bilaterally. GI: Soft, nontender, non-distended  MS: No  edema; No deformity. Neuro:  Nonfocal  Psych: Normal affect   Labs    Chemistry Recent Labs  Lab 09/01/18 1043 09/01/18 1050 09/02/18 0710 09/04/18 0502  NA 138 138 137 132*  K 4.0 4.0 5.5* 5.0  CL 94* 95* 94* 92*  CO2 29  --  27 24  GLUCOSE 88 87 86 75  BUN 60* 55* 76* 75*  CREATININE 10.82* 12.00* 13.12* 12.45*  CALCIUM 8.5*  --  8.5* 8.6*  ALBUMIN  --   --  3.3* 3.4*  GFRNONAA 4*  --  3* 4*  GFRAA 5*  --  4* 4*  ANIONGAP 15  --  16* 16*     Hematology Recent Labs  Lab 09/02/18 0710 09/03/18 0435 09/04/18 0502  WBC 6.8 6.6 7.5  RBC 3.38* 3.28* 3.32*  HGB 11.2* 10.5* 10.8*  HCT 32.6* 31.8* 32.3*  MCV 96.4 97.0 97.3  MCH 33.1 32.0 32.5  MCHC 34.4 33.0 33.4  RDW 14.2 13.9 14.0  PLT 198 250 230    Cardiac Enzymes Recent Labs  Lab  09/01/18 1406 09/01/18 1915 09/02/18 0229  TROPONINI 0.03* 0.03* 0.03*    Recent Labs  Lab 09/01/18 1048  TROPIPOC 0.04     BNPNo results for input(s): BNP, PROBNP in the last 168 hours.   DDimer No results for input(s): DDIMER in the last 168 hours.   Radiology    Vas US Doppler Pre Cabg  Result Date: 09/03/2018 PREOPERATIVE VASCULAR EVALUATION  Indications: PRE CABG. Performing Technologist: Landry Mellow RDMS, RVT  Examination Guidelines: A complete evaluation includes B-mode imaging, spectral Doppler, color Doppler, and power Doppler as needed of all accessible portions of each vessel. Bilateral testing is considered an integral part of a complete examination. Limited examinations for reoccurring indications may be performed as noted.  Right Carotid Findings: +----------+--------+--------+--------+------------+--------+           PSV cm/sEDV cm/sStenosisDescribe    Comments  +----------+--------+--------+--------+------------+--------+ CCA Prox  129     12              heterogenous         +----------+--------+--------+--------+------------+--------+ CCA Distal94      12                                   +----------+--------+--------+--------+------------+--------+ ICA Prox  25      8       1-39%   heterogenous         +----------+--------+--------+--------+------------+--------+ ECA       73      3                                    +----------+--------+--------+--------+------------+--------+ Portions of this table do not appear on this page. +----------+--------+-------+------------+------------+           PSV cm/sEDV cmsDescribe    Arm Pressure +----------+--------+-------+------------+------------+ Subclavian               Not assessed130          +----------+--------+-------+------------+------------+ +---------+--------+--------+--------------+ VertebralPSV cm/sEDV cm/sNot identified +---------+--------+--------+--------------+ technically difficult due to patient movement Left Carotid Findings: +----------+--------+--------+--------+------------+--------+           PSV cm/sEDV cm/sStenosisDescribe    Comments +----------+--------+--------+--------+------------+--------+ CCA Prox  86      15              heterogenous         +----------+--------+--------+--------+------------+--------+ CCA Distal44      6                                    +----------+--------+--------+--------+------------+--------+ ICA Prox  26      12      1-39%   heterogenous         +----------+--------+--------+--------+------------+--------+ ICA Distal41      14                                   +----------+--------+--------+--------+------------+--------+ ECA       142     14                                   +----------+--------+--------+--------+------------+--------+  +----------+--------+--------+------------+------------+ SubclavianPSV cm/sEDV cm/sDescribe    Arm Pressure +----------+--------+--------+------------+------------+  Not assessed             +----------+--------+--------+------------+------------+ +---------+--------+--+--------+--+---------+ VertebralPSV cm/s35EDV cm/s12Antegrade +---------+--------+--+--------+--+---------+ technically difficult due to patient movement ABI Findings: +--------+------------------+-----+-----------+--------+ Right   Rt Pressure (mmHg)IndexWaveform   Comment  +--------+------------------+-----+-----------+--------+ Brachial130                    triphasic           +--------+------------------+-----+-----------+--------+ ATA                            not audible         +--------+------------------+-----+-----------+--------+ PTA     87                0.67 monophasic          +--------+------------------+-----+-----------+--------+ +--------+------------------+-----+-----------+------------------------------+ Left    Lt Pressure (mmHg)IndexWaveform   Comment                        +--------+------------------+-----+-----------+------------------------------+ Brachial                                  BP not taken due to AV fistula +--------+------------------+-----+-----------+------------------------------+ ATA                            not audible                               +--------+------------------+-----+-----------+------------------------------+ PTA     74                0.57 monophasic                                +--------+------------------+-----+-----------+------------------------------+  Right Doppler Findings: +--------+--------+-----+---------+--------+ Site    PressureIndexDoppler  Comments +--------+--------+-----+---------+--------+ Brachial130          triphasic          +--------+--------+-----+---------+--------+ Radial               triphasic         +--------+--------+-----+---------+--------+ Ulnar                triphasic         +--------+--------+-----+---------+--------+  Left Doppler Findings: +--------+--------+-----+-------+------------------------------+ Site    PressureIndexDopplerComments                       +--------+--------+-----+-------+------------------------------+ Brachial                    BP not taken due to AV fistula +--------+--------+-----+-------+------------------------------+ Radial                      not assessed                   +--------+--------+-----+-------+------------------------------+ Ulnar                       not assessed                   +--------+--------+-----+-------+------------------------------+  Summary: Right Carotid: Velocities in the right ICA are consistent with a 1-39% stenosis. Left Carotid: Velocities in the left ICA are consistent with a 1-39% stenosis. Right ABI:  Resting right ankle-brachial index indicates moderate right lower extremity arterial disease. Left ABI: Resting left ankle-brachial index indicates moderate left lower extremity arterial disease. Right Upper Extremity: Doppler waveforms decrease >50% with right radial compression. Doppler waveforms remain within normal limits with right ulnar compression. Left Upper Extremity: Left not evaluated due to AV fistula.  Electronically signed by Monica Martinez MD on 09/03/2018 at 5:01:40 PM.    Final     Cardiac Studies   Cardiac cath  Diagnostic Diagram        Patient Profile     62 y.o. male with a hx of cardiomyopathy w/ prior EF of 25-30%, felt to be NICM given low risk Myoview in 2017, EF has since normalized on subsequent echos, diastolic HF, h/o heavy ETHO use, NSVT, atrial fibrillation/flutter previously felt to be a poor candidate for anticoagulation due to poor compliance and ETHO use, ESRD on HD  (Tu,Th,Sa) and HTN, who was found to have 3 vessel CAD.  Felt on consult with Dr. Prescott Gum not to be a surgical candidate.     Assessment & Plan   CAD:  Not thought to be a surgical candidate.  I have reviewed the films myself .  I spoke with Dr. Saunders Revel who did his original cath.  The patient is very high risk for PCI.  This risk might be prohibitive.  Stent thrombosis, were he not to be compliant with meds would likely be fatal.  I spoke with the patient about our hard choices.  We will need to reconvene as a team on Monday to finalize the plan.  I spoke with the primary team and the patient will remain on heparin.   ESRD:  Per nephrology.    HTN:  Continue current therapy.    For questions or updates, please contact Isabel Please consult [email or web address] for contact info under Cardiology/STEMI.   Signed, Minus Breeding, MD  09/04/2018, 9:32 AM

## 2018-09-04 NOTE — Progress Notes (Signed)
Pt requesting stool softener  Text page sent to MD  Awaiting call back

## 2018-09-04 NOTE — Progress Notes (Signed)
PROGRESS NOTE  Kenneth [name] EZM:[id number] DOB: January 25, 1956 DOA: 09/01/2018 PCP: [name], MD  HPI/Recap of past 24 hours: 62 y.o.[name] a hx of cardiomyopathy w/ prior EF of 25-30%, felt to be NICM given low risk Myoview in 2017, EF has since normalized on subsequent echos, diastolic HF, h/o heavy ETHO use, NSVT, atrial fibrillation/flutter previously felt to be a poor candidate for anticoagulation due to poor compliance and ETHO use, ESRD on HD (Tu,Th,Sa) and HTN admitted on 09/01/2018 with intermittent chest pains and found to have severe obstructive CAD, currently awaiting CT surgery consult for possible CABG, on IV heparin.  09/03/2018: Patient seen and examined at his bedside.  He denies chest pain, palpitations or dyspnea at rest.  Cardiology, CTS, and nephrology consulted and following.  CTS will make a decision whether the patient is a candidate for CABG.  Tentatively scheduled for next Friday, 09/10/2018.  Dr. Prescott Gum to evaluate.  09/04/2018: Patient is seen during dialysis together with cardiology Dr Percival Spanish Currently on heparin drip, currently no chest pain, hemodynamically stable, tolerating dialysis  Assessment/Plan: Active Problems:   Chronic diastolic CHF (congestive heart failure) (HCC)   Alcoholism ([place])   ACS (acute coronary syndrome) (HCC)   Unstable angina (HCC)  Chest pain, ACS ruled in, unstable angina Troponin remained flat at 0.03 Now chest pain-free on heparin drip Possible CABG ? Will follow cardiology/thoracic surgery recommendation  Chronic diastolic CHF 2D echo done on 09/01/2018 revealed preserved LVEF 55 to 60% with grade 1 diastolic dysfunction with no regional wall motion abnormality and possibly small patent foramen ovale  End-stage renal disease on dialysis Tuesday Thursday and Saturday HD yesterday Plan for dialysis tomorrow 09/04/2018  Hx of p-Afib Not a good candidate for anticoagulation due to alcohol abuse and  noncompliance\ On IV heparin in anticipation for possible CABG  Hypertension Blood pressure stable Continue cardiac medications On carvedilol  History of alcohol abuse Continue folic acid and thiamine CIWA protocol in place    Code Status: full   Family Communication: none at bedside  Disposition Plan: not ready to discharge, needs cardiology/thoracic surgery clearance   Consultants:  Nephrology  CT surgery  cardiology   Procedures:  dialysis  Antimicrobials:   none  DVT prophylaxis:  iv heparin   Objective: Vitals:   09/04/18 1100 09/04/18 1130 09/04/18 1200 09/04/18 1230  BP: 97/82 (!) 86/50 107/73 101/71  Pulse: 81 75 79 61  Resp:      Temp:      TempSrc:      SpO2:      Weight:        Intake/Output Summary (Last 24 hours) at 09/04/2018 1236 Last data filed at 09/04/2018 0700 Gross per 24 hour  Intake 818.53 ml  Output 0 ml  Net 818.53 ml   Filed Weights   09/03/18 0522 09/04/18 0525 09/04/18 0806  Weight: 90 kg 92.1 kg 92.2 kg    Exam:  . General: 62 y.o. year-old male well developed well nourished in no acute distress.  Alert and oriented x3. . Cardiovascular: Regular rate and rhythm with no rubs or gallops.  No thyromegaly or JVD noted.   Marland Kitchen Respiratory: Rales at bases with no wheezes.. Good inspiratory effort. . Abdomen: Soft nontender nondistended with normal bowel sounds x4 quadrants. . Musculoskeletal: No lower extremity edema. 2/4 pulses in all 4 extremities. . Skin: No ulcerative lesions noted or rashes.  Left upper extremity fistula. Marland Kitchen Psychiatry: Mood is appropriate for condition and setting  Data Reviewed: CBC: Recent Labs  Lab 09/01/18 1043 09/01/18 1050 09/02/18 0710 09/03/18 0435 09/04/18 0502  WBC 7.2  --  6.8 6.6 7.5  HGB 10.9* 10.9* 11.2* 10.5* 10.8*  HCT 32.6* 32.0* 32.6* 31.8* 32.3*  MCV 97.9  --  96.4 97.0 97.3  PLT 218  --  198 250 591   Basic Metabolic Panel: Recent Labs  Lab 09/01/18 1043  09/01/18 1050 09/02/18 0710 09/04/18 0502  NA 138 138 137 132*  K 4.0 4.0 5.5* 5.0  CL 94* 95* 94* 92*  CO2 29  --  27 24  GLUCOSE 88 87 86 75  BUN 60* 55* 76* 75*  CREATININE 10.82* 12.00* 13.12* 12.45*  CALCIUM 8.5*  --  8.5* 8.6*  PHOS  --   --  8.5* 5.9*   GFR: Estimated Creatinine Clearance: 6.6 mL/min (A) (by C-G formula based on SCr of 12.45 mg/dL (H)). Liver Function Tests: Recent Labs  Lab 09/02/18 0710 09/04/18 0502  ALBUMIN 3.3* 3.4*   No results for input(s): LIPASE, AMYLASE in the last 168 hours. No results for input(s): AMMONIA in the last 168 hours. Coagulation Profile: No results for input(s): INR, PROTIME in the last 168 hours. Cardiac Enzymes: Recent Labs  Lab 09/01/18 1406 09/01/18 1915 09/02/18 0229  TROPONINI 0.03* 0.03* 0.03*   BNP (last 3 results) No results for input(s): PROBNP in the last 8760 hours. HbA1C: No results for input(s): HGBA1C in the last 72 hours. CBG: No results for input(s): GLUCAP in the last 168 hours. Lipid Profile: Recent Labs    09/04/18 0502  CHOL 222*  HDL 50  LDLCALC 153*  TRIG 95  CHOLHDL 4.4   Thyroid Function Tests: No results for input(s): TSH, T4TOTAL, FREET4, T3FREE, THYROIDAB in the last 72 hours. Anemia Panel: No results for input(s): VITAMINB12, FOLATE, FERRITIN, TIBC, IRON, RETICCTPCT in the last 72 hours. Urine analysis:    Component Value Date/Time   COLORURINE YELLOW 05/12/2016 0834   APPEARANCEUR CLOUDY (A) 05/12/2016 0834   LABSPEC 1.014 05/12/2016 0834   PHURINE 5.0 05/12/2016 0834   GLUCOSEU NEGATIVE 05/12/2016 0834   HGBUR MODERATE (A) 05/12/2016 0834   BILIRUBINUR NEGATIVE 05/12/2016 0834   KETONESUR NEGATIVE 05/12/2016 0834   PROTEINUR 100 (A) 05/12/2016 0834   UROBILINOGEN 0.2 12/09/2013 0013   NITRITE NEGATIVE 05/12/2016 0834   LEUKOCYTESUR NEGATIVE 05/12/2016 0834   Sepsis Labs: @LABRCNTIP (procalcitonin:4,lacticidven:4)  ) Recent Results (from the past 240 hour(s))  MRSA  PCR Screening     Status: None   Collection Time: 09/01/18  5:29 PM  Result Value Ref Range Status   MRSA by PCR NEGATIVE NEGATIVE Final    Comment:        The GeneXpert MRSA Assay (FDA approved for NASAL specimens only), is one component of a comprehensive MRSA colonization surveillance program. It is not intended to diagnose MRSA infection nor to guide or monitor treatment for MRSA infections. Performed at [facility], [place] [address] [place], [place] [address]       Studies: Vas US Doppler Pre Cabg  Result Date: 09/03/2018 PREOPERATIVE VASCULAR EVALUATION  Indications: PRE CABG. Performing Technologist: Landry Mellow RDMS, RVT  Examination Guidelines: A complete evaluation includes B-mode imaging, spectral Doppler, color Doppler, and power Doppler as needed of all accessible portions of each vessel. Bilateral testing is considered an integral part of a complete examination. Limited examinations for reoccurring indications may be performed as noted.  Right Carotid Findings: +----------+--------+--------+--------+------------+--------+  PSV cm/sEDV cm/sStenosisDescribe    Comments +----------+--------+--------+--------+------------+--------+ CCA Prox  129     12              heterogenous         +----------+--------+--------+--------+------------+--------+ CCA Distal94      12                                   +----------+--------+--------+--------+------------+--------+ ICA Prox  25      8       1-39%   heterogenous         +----------+--------+--------+--------+------------+--------+ ECA       73      3                                    +----------+--------+--------+--------+------------+--------+ Portions of this table do not appear on this page. +----------+--------+-------+------------+------------+           PSV cm/sEDV cmsDescribe    Arm Pressure +----------+--------+-------+------------+------------+ Subclavian                Not assessed130          +----------+--------+-------+------------+------------+ +---------+--------+--------+--------------+ VertebralPSV cm/sEDV cm/sNot identified +---------+--------+--------+--------------+ technically difficult due to patient movement Left Carotid Findings: +----------+--------+--------+--------+------------+--------+           PSV cm/sEDV cm/sStenosisDescribe    Comments +----------+--------+--------+--------+------------+--------+ CCA Prox  86      15              heterogenous         +----------+--------+--------+--------+------------+--------+ CCA Distal44      6                                    +----------+--------+--------+--------+------------+--------+ ICA Prox  26      12      1-39%   heterogenous         +----------+--------+--------+--------+------------+--------+ ICA Distal41      14                                   +----------+--------+--------+--------+------------+--------+ ECA       142     14                                   +----------+--------+--------+--------+------------+--------+ +----------+--------+--------+------------+------------+ SubclavianPSV cm/sEDV cm/sDescribe    Arm Pressure +----------+--------+--------+------------+------------+                           Not assessed             +----------+--------+--------+------------+------------+ +---------+--------+--+--------+--+---------+ VertebralPSV cm/s35EDV cm/s12Antegrade +---------+--------+--+--------+--+---------+ technically difficult due to patient movement ABI Findings: +--------+------------------+-----+-----------+--------+ Right   Rt Pressure (mmHg)IndexWaveform   Comment  +--------+------------------+-----+-----------+--------+ Brachial130                    triphasic           +--------+------------------+-----+-----------+--------+ ATA                            not audible          +--------+------------------+-----+-----------+--------+ PTA  87                0.67 monophasic          +--------+------------------+-----+-----------+--------+ +--------+------------------+-----+-----------+------------------------------+ Left    Lt Pressure (mmHg)IndexWaveform   Comment                        +--------+------------------+-----+-----------+------------------------------+ Brachial                                  BP not taken due to AV fistula +--------+------------------+-----+-----------+------------------------------+ ATA                            not audible                               +--------+------------------+-----+-----------+------------------------------+ PTA     74                0.57 monophasic                                +--------+------------------+-----+-----------+------------------------------+  Right Doppler Findings: +--------+--------+-----+---------+--------+ Site    PressureIndexDoppler  Comments +--------+--------+-----+---------+--------+ Brachial130          triphasic         +--------+--------+-----+---------+--------+ Radial               triphasic         +--------+--------+-----+---------+--------+ Ulnar                triphasic         +--------+--------+-----+---------+--------+  Left Doppler Findings: +--------+--------+-----+-------+------------------------------+ Site    PressureIndexDopplerComments                       +--------+--------+-----+-------+------------------------------+ Brachial                    BP not taken due to AV fistula +--------+--------+-----+-------+------------------------------+ Radial                      not assessed                   +--------+--------+-----+-------+------------------------------+ Ulnar                       not assessed                   +--------+--------+-----+-------+------------------------------+  Summary: Right Carotid:  Velocities in the right ICA are consistent with a 1-39% stenosis. Left Carotid: Velocities in the left ICA are consistent with a 1-39% stenosis. Right ABI: Resting right ankle-brachial index indicates moderate right lower extremity arterial disease. Left ABI: Resting left ankle-brachial index indicates moderate left lower extremity arterial disease. Right Upper Extremity: Doppler waveforms decrease >50% with right radial compression. Doppler waveforms remain within normal limits with right ulnar compression. Left Upper Extremity: Left not evaluated due to AV fistula.  Electronically signed by Monica Martinez MD on 09/03/2018 at 5:01:40 PM.    Final     Scheduled Meds: . calcitRIOL      . calcitRIOL      . aspirin EC  81 mg Oral Daily  . atorvastatin  80 mg Oral q1800  . calcitRIOL  0.75 mcg Oral Q T,Th,Sa-HD  .  carvedilol  6.25 mg Oral BID WC  . Chlorhexidine Gluconate Cloth  6 each Topical Q0600  . Chlorhexidine Gluconate Cloth  6 each Topical Q0600  . cinacalcet  60 mg Oral Q T,Th,Sa-HD  . divalproex  500 mg Oral BID  . folic acid  1 mg Oral Daily  . isosorbide mononitrate  30 mg Oral Daily  . lanthanum  2,000 mg Oral TID WC  . multivitamin with minerals  1 tablet Oral Daily  . sodium chloride flush  3 mL Intravenous Q12H  . thiamine  100 mg Oral Daily    Continuous Infusions: . sodium chloride    . sodium chloride    . sodium chloride    . heparin 1,150 Units/hr (09/03/18 2124)     LOS: 3 days     Florencia Reasons, MD PhD [facility] Pager [phone]  If 7PM-7AM, please contact night-coverage [email or web address] Password [facility] 09/04/2018, 12:36 PM

## 2018-09-04 NOTE — Progress Notes (Signed)
ANTICOAGULATION CONSULT NOTE  Pharmacy Consult for heparin Indication: chest pain/ACS  No Known Allergies  Patient Measurements: Weight: 203 lb (92.1 kg)(Scale C) Heparin Dosing Weight: 90kg  Vital Signs: Temp: 97.7 F (36.5 C) (11/09 0525) Temp Source: Oral (11/09 0525) BP: 119/55 (11/09 0525) Pulse Rate: 66 (11/09 0525)  Labs: Recent Labs    09/01/18 1050 09/01/18 1406 09/01/18 1915 09/02/18 0229 09/02/18 0710 09/03/18 0435 09/03/18 0823 09/03/18 1623 09/04/18 0502  HGB 10.9*  --   --   --  11.2* 10.5*  --   --  10.8*  HCT 32.0*  --   --   --  32.6* 31.8*  --   --  32.3*  PLT  --   --   --   --  198 250  --   --  230  HEPARINUNFRC  --   --   --   --   --   --  0.28* 0.38 0.38  CREATININE 12.00*  --   --   --  13.12*  --   --   --  12.45*  TROPONINI  --  0.03* 0.03* 0.03*  --   --   --   --   --     Estimated Creatinine Clearance: 6.6 mL/min (A) (by C-G formula based on SCr of 12.45 mg/dL (H)).   Assessment: 49 yoM with ESRD and HTN admitted with atypical CP now s/p LHC. Pt found to have significant 3-vessel CAD and will need surgical consult.   Heparin level is therapeutic this morning at 0.38; no bleeding noted. Continue heparin infusion at 1150 units / hr. Hgb 10.8 and stable; hct 32.3 and stable.   Goal of Therapy:  Heparin level 0.3-0.7 units/ml Monitor platelets by anticoagulation protocol: Yes   Plan:  Continue heparin infusion at 1150 units/hr Daily CBC and heparin level.  Thank you for allowing pharmacy to be a part of this patient's care.  Tamela Gammon, PharmD 09/04/2018 8:46 AM PGY-1 Pharmacy Resident Direct Phone: [phone] Please check [email or web address] for unit-specific pharmacist phone numbers

## 2018-09-04 NOTE — Progress Notes (Signed)
Verified with HD RN, pt received his calcitRIOL

## 2018-09-04 NOTE — Progress Notes (Signed)
Will not release sign and held orders as they pertain to HD department

## 2018-09-04 NOTE — Progress Notes (Signed)
Transport at bedside  

## 2018-09-04 NOTE — Progress Notes (Signed)
Report given to HD from night shift RN  Awaiting transport

## 2018-09-05 LAB — CBC
HCT: 33.1 % — ABNORMAL LOW (ref 39.0–52.0)
Hemoglobin: 11.1 g/dL — ABNORMAL LOW (ref 13.0–17.0)
MCH: 32.6 pg (ref 26.0–34.0)
MCHC: 33.5 g/dL (ref 30.0–36.0)
MCV: 97.4 fL (ref 80.0–100.0)
NRBC: 0 % (ref 0.0–0.2)
PLATELETS: 240 10*3/uL (ref 150–400)
RBC: 3.4 MIL/uL — ABNORMAL LOW (ref 4.22–5.81)
RDW: 14 % (ref 11.5–15.5)
WBC: 8 10*3/uL (ref 4.0–10.5)

## 2018-09-05 LAB — HEPARIN LEVEL (UNFRACTIONATED): HEPARIN UNFRACTIONATED: 0.34 [IU]/mL (ref 0.30–0.70)

## 2018-09-05 MED ORDER — ISOSORBIDE MONONITRATE ER 60 MG PO TB24
60.0000 mg | ORAL_TABLET | Freq: Every day | ORAL | Status: DC
  Administered 2018-09-05 – 2018-09-07 (×3): 60 mg via ORAL
  Filled 2018-09-05 (×3): qty 1

## 2018-09-05 MED ORDER — ACETAMINOPHEN 325 MG PO TABS
650.0000 mg | ORAL_TABLET | Freq: Four times a day (QID) | ORAL | Status: DC | PRN
  Administered 2018-09-05: 650 mg via ORAL
  Filled 2018-09-05: qty 2

## 2018-09-05 NOTE — Progress Notes (Signed)
Progress Note  Patient Name: Kenneth [name] Date of Encounter: 09/05/2018  Primary Cardiologist:   Peter [name], MD   Subjective   He denies chest pain.  No SOB.    Inpatient Medications    Scheduled Meds: . aspirin EC  81 mg Oral Daily  . atorvastatin  80 mg Oral q1800  . calcitRIOL  0.75 mcg Oral Q T,Th,Sa-HD  . carvedilol  6.25 mg Oral BID WC  . Chlorhexidine Gluconate Cloth  6 each Topical Q0600  . Chlorhexidine Gluconate Cloth  6 each Topical Q0600  . cinacalcet  60 mg Oral Q T,Th,Sa-HD  . divalproex  500 mg Oral BID  . folic acid  1 mg Oral Daily  . isosorbide mononitrate  30 mg Oral Daily  . lanthanum  2,000 mg Oral TID WC  . multivitamin with minerals  1 tablet Oral Daily  . polyethylene glycol  17 g Oral Daily  . senna-docusate  1 tablet Oral BID  . sodium chloride flush  3 mL Intravenous Q12H  . thiamine  100 mg Oral Daily   Continuous Infusions: . sodium chloride    . heparin 1,150 Units/hr (09/04/18 2334)   PRN Meds: sodium chloride, HYDROmorphone (DILAUDID) injection, LORazepam **OR** LORazepam, nitroGLYCERIN, ondansetron **OR** ondansetron (ZOFRAN) IV, sodium chloride flush, zolpidem   Vital Signs    Vitals:   09/04/18 1709 09/04/18 1954 09/04/18 2340 09/05/18 0427  BP: (!) 149/94 132/70 102/65 (!) 161/96  Pulse: 84 93 100 78  Resp: 20 20 20 20   Temp: 98.4 F (36.9 C) 98.3 F (36.8 C) 98.2 F (36.8 C) 98.1 F (36.7 C)  TempSrc: Oral Oral Oral Oral  SpO2: 100% 100% 99% 100%  Weight:    90.3 kg    Intake/Output Summary (Last 24 hours) at 09/05/2018 0755 Last data filed at 09/05/2018 0600 Gross per 24 hour  Intake 563 ml  Output 2501 ml  Net -1938 ml   Filed Weights   09/04/18 0806 09/04/18 1247 09/05/18 0427  Weight: 92.2 kg 89.4 kg 90.3 kg    Telemetry    NSR, PACs - Personally Reviewed  ECG    NA - Personally Reviewed  Physical Exam   GEN: No  acute distress.   Neck: No  JVD Cardiac: RRR, no murmurs, rubs, or gallops.   Respiratory: Clear   to auscultation bilaterally. GI: Soft, nontender, non-distended, normal bowel sounds  MS:  No edema; No deformity. Neuro:   Nonfocal  Psych: Oriented and appropriate   Labs    Chemistry Recent Labs  Lab 09/01/18 1043 09/01/18 1050 09/02/18 0710 09/04/18 0502  NA 138 138 137 132*  K 4.0 4.0 5.5* 5.0  CL 94* 95* 94* 92*  CO2 29  --  27 24  GLUCOSE 88 87 86 75  BUN 60* 55* 76* 75*  CREATININE 10.82* 12.00* 13.12* 12.45*  CALCIUM 8.5*  --  8.5* 8.6*  ALBUMIN  --   --  3.3* 3.4*  GFRNONAA 4*  --  3* 4*  GFRAA 5*  --  4* 4*  ANIONGAP 15  --  16* 16*     Hematology Recent Labs  Lab 09/02/18 0710 09/03/18 0435 09/04/18 0502  WBC 6.8 6.6 7.5  RBC 3.38* 3.28* 3.32*  HGB 11.2* 10.5* 10.8*  HCT 32.6* 31.8* 32.3*  MCV 96.4 97.0 97.3  MCH 33.1 32.0 32.5  MCHC 34.4 33.0 33.4  RDW 14.2 13.9 14.0  PLT 198 250 230    Cardiac Enzymes Recent Labs  Lab 09/01/18 1406 09/01/18 1915 09/02/18 0229  TROPONINI 0.03* 0.03* 0.03*    Recent Labs  Lab 09/01/18 1048  TROPIPOC 0.04     BNPNo results for input(s): BNP, PROBNP in the last 168 hours.   DDimer No results for input(s): DDIMER in the last 168 hours.   Radiology    Vas US Doppler Pre Cabg  Result Date: 09/03/2018 PREOPERATIVE VASCULAR EVALUATION  Indications: PRE CABG. Performing Technologist: Landry Mellow RDMS, RVT  Examination Guidelines: A complete evaluation includes B-mode imaging, spectral Doppler, color Doppler, and power Doppler as needed of all accessible portions of each vessel. Bilateral testing is considered an integral part of a complete examination. Limited examinations for reoccurring indications may be performed as noted.  Right Carotid Findings: +----------+--------+--------+--------+------------+--------+           PSV cm/sEDV cm/sStenosisDescribe    Comments +----------+--------+--------+--------+------------+--------+ CCA Prox  129     12              heterogenous          +----------+--------+--------+--------+------------+--------+ CCA Distal94      12                                   +----------+--------+--------+--------+------------+--------+ ICA Prox  25      8       1-39%   heterogenous         +----------+--------+--------+--------+------------+--------+ ECA       73      3                                    +----------+--------+--------+--------+------------+--------+ Portions of this table do not appear on this page. +----------+--------+-------+------------+------------+           PSV cm/sEDV cmsDescribe    Arm Pressure +----------+--------+-------+------------+------------+ Subclavian               Not assessed130          +----------+--------+-------+------------+------------+ +---------+--------+--------+--------------+ VertebralPSV cm/sEDV cm/sNot identified +---------+--------+--------+--------------+ technically difficult due to patient movement Left Carotid Findings: +----------+--------+--------+--------+------------+--------+           PSV cm/sEDV cm/sStenosisDescribe    Comments +----------+--------+--------+--------+------------+--------+ CCA Prox  86      15              heterogenous         +----------+--------+--------+--------+------------+--------+ CCA Distal44      6                                    +----------+--------+--------+--------+------------+--------+ ICA Prox  26      12      1-39%   heterogenous         +----------+--------+--------+--------+------------+--------+ ICA Distal41      14                                   +----------+--------+--------+--------+------------+--------+ ECA       142     14                                   +----------+--------+--------+--------+------------+--------+ +----------+--------+--------+------------+------------+ SubclavianPSV cm/sEDV cm/sDescribe    Arm Pressure  +----------+--------+--------+------------+------------+  Not assessed             +----------+--------+--------+------------+------------+ +---------+--------+--+--------+--+---------+ VertebralPSV cm/s35EDV cm/s12Antegrade +---------+--------+--+--------+--+---------+ technically difficult due to patient movement ABI Findings: +--------+------------------+-----+-----------+--------+ Right   Rt Pressure (mmHg)IndexWaveform   Comment  +--------+------------------+-----+-----------+--------+ Brachial130                    triphasic           +--------+------------------+-----+-----------+--------+ ATA                            not audible         +--------+------------------+-----+-----------+--------+ PTA     87                0.67 monophasic          +--------+------------------+-----+-----------+--------+ +--------+------------------+-----+-----------+------------------------------+ Left    Lt Pressure (mmHg)IndexWaveform   Comment                        +--------+------------------+-----+-----------+------------------------------+ Brachial                                  BP not taken due to AV fistula +--------+------------------+-----+-----------+------------------------------+ ATA                            not audible                               +--------+------------------+-----+-----------+------------------------------+ PTA     74                0.57 monophasic                                +--------+------------------+-----+-----------+------------------------------+  Right Doppler Findings: +--------+--------+-----+---------+--------+ Site    PressureIndexDoppler  Comments +--------+--------+-----+---------+--------+ Brachial130          triphasic         +--------+--------+-----+---------+--------+ Radial               triphasic         +--------+--------+-----+---------+--------+ Ulnar                 triphasic         +--------+--------+-----+---------+--------+  Left Doppler Findings: +--------+--------+-----+-------+------------------------------+ Site    PressureIndexDopplerComments                       +--------+--------+-----+-------+------------------------------+ Brachial                    BP not taken due to AV fistula +--------+--------+-----+-------+------------------------------+ Radial                      not assessed                   +--------+--------+-----+-------+------------------------------+ Ulnar                       not assessed                   +--------+--------+-----+-------+------------------------------+  Summary: Right Carotid: Velocities in the right ICA are consistent with a 1-39% stenosis. Left Carotid: Velocities in the left ICA are consistent with a 1-39% stenosis. Right ABI:  Resting right ankle-brachial index indicates moderate right lower extremity arterial disease. Left ABI: Resting left ankle-brachial index indicates moderate left lower extremity arterial disease. Right Upper Extremity: Doppler waveforms decrease >50% with right radial compression. Doppler waveforms remain within normal limits with right ulnar compression. Left Upper Extremity: Left not evaluated due to AV fistula.  Electronically signed by Monica Martinez MD on 09/03/2018 at 5:01:40 PM.    Final     Cardiac Studies   Cardiac cath  Diagnostic Diagram        Patient Profile     62 y.o. male with a hx of cardiomyopathy w/ prior EF of 25-30%, felt to be NICM given low risk Myoview in 2017, EF has since normalized on subsequent echos, diastolic HF, h/o heavy ETHO use, NSVT, atrial fibrillation/flutter previously felt to be a poor candidate for anticoagulation due to poor compliance and ETHO use, ESRD on HD (Tu,Th,Sa) and HTN, who was found to have 3 vessel CAD.  Felt on consult with Dr. Prescott Gum not to be a surgical candidate.     Assessment & Plan    CAD:  Not thought to be a surgical candidate.   Continue heparin.  Increase Imdur.  I spoke with Dr. Saunders Revel.  Our team will need to have a conference and review his films again in the AM to consider therapies.  PCI risk is very high.  I discussed this with him again today.  No family to talk to as his daughter is in the hospital.   ESRD:  Per nephrology.    HTN:  Continue current therapy.    For questions or updates, please contact [place] Please consult [email or web address] for contact info under Cardiology/STEMI.   Signed, Minus Breeding, MD  09/05/2018, 7:55 AM

## 2018-09-05 NOTE — Progress Notes (Signed)
PROGRESS NOTE  Kenneth [name] UTM:[id number] DOB: 1956-09-13 DOA: 09/01/2018 PCP: [name], MD  HPI/Recap of past 24 hours: 62 y.o.[name] a hx of cardiomyopathy w/ prior EF of 25-30%, felt to be NICM given low risk Myoview in 2017, EF has since normalized on subsequent echos, diastolic HF, h/o heavy ETHO use, NSVT, atrial fibrillation/flutter previously felt to be a poor candidate for anticoagulation due to poor compliance and ETHO use, ESRD on HD (Tu,Th,Sa) and HTN admitted on 09/01/2018 with intermittent chest pains and found to have severe obstructive CAD, currently awaiting CT surgery consult for possible CABG, on IV heparin.  09/03/2018: Patient seen and examined at his bedside.  He denies chest pain, palpitations or dyspnea at rest.  Cardiology, CTS, and nephrology consulted and following.  CTS will make a decision whether the patient is a candidate for CABG.  Tentatively scheduled for next Friday, 09/10/2018.  Dr. Prescott Gum to evaluate.  09/05/18: No acute events overnight.  Denies chest pain, palpitations or dyspnea.  Three-vessel coronary artery disease on left heart cath.  Cardiology and CTS will discuss tomorrow and make decision about CABG.  Assessment/Plan: Active Problems:   Chronic diastolic CHF (congestive heart failure) (HCC)   Alcoholism (HCC)   ACS (acute coronary syndrome) (HCC)   Unstable angina (HCC)  Chest pain, ACS ruled in, unstable angina Troponin remained flat at 0.03 Now chest pain-free Plan for possible CABG next Friday, 09/10/2018 Cardiology and CTS following  Coronary artery disease status post PCI LHC done on 09/02/2018 revealed three-vessel disease On heparin drip CTS and cardiology will decide tomorrow on CABG Is here to closely monitor on telemetry  Chronic diastolic CHF 2D echo done on 09/01/2018 revealed preserved LVEF 55 to 60% with grade 1 diastolic dysfunction with no regional wall motion abnormality and possibly small patent  foramen ovale  End-stage renal disease on dialysis Tuesday Thursday and Saturday HD yesterday Plan for dialysis tomorrow 09/04/2018  Hx of p-Afib Not a good candidate for anticoagulation due to alcohol abuse and noncompliance\ On IV heparin in anticipation for possible CABG  Hypertension Blood pressure stable Continue cardiac medications On carvedilol  History of alcohol abuse Continue folic acid and thiamine CIWA protocol in place    Code Status: full   Family Communication: none at bedside  Disposition Plan: home when clinically stable   Consultants:  Nephrology\  CT surgery  cardiology   Pharmacy  Procedures:  none  Antimicrobials:   none  DVT prophylaxis:  iv heparin   Objective: Vitals:   09/04/18 1954 09/04/18 2340 09/05/18 0427 09/05/18 0836  BP: 132/70 102/65 (!) 161/96 98/67  Pulse: 93 100 78 81  Resp: 20 20 20 17   Temp: 98.3 F (36.8 C) 98.2 F (36.8 C) 98.1 F (36.7 C) 98.6 F (37 C)  TempSrc: Oral Oral Oral Oral  SpO2: 100% 99% 100% 100%  Weight:   90.3 kg     Intake/Output Summary (Last 24 hours) at 09/05/2018 1123 Last data filed at 09/05/2018 0837 Gross per 24 hour  Intake 683 ml  Output 2501 ml  Net -1818 ml   Filed Weights   09/04/18 0806 09/04/18 1247 09/05/18 0427  Weight: 92.2 kg 89.4 kg 90.3 kg    Exam:  . General: 62 y.o. year-old male well-developed well-nourished in no acute distress.  Alert and oriented x3.   . Cardiovascular: Regular rate and rhythm with no rubs or gallops.  No JVD or thyromegaly noted.   Marland Kitchen Respiratory: Rales at bases  with no wheezes.. Good inspiratory effort. . Abdomen: Soft nontender nondistended with normal bowel sounds x4 quadrants. . Musculoskeletal: No lower extremity edema. 2/4 pulses in all 4 extremities. . Skin: No ulcerative lesions noted or rashes.  Left upper extremity fistula. Marland Kitchen Psychiatry: Mood is appropriate for condition and setting   Data Reviewed: CBC: Recent Labs    Lab 09/01/18 1043 09/01/18 1050 09/02/18 0710 09/03/18 0435 09/04/18 0502 09/05/18 0732  WBC 7.2  --  6.8 6.6 7.5 8.0  HGB 10.9* 10.9* 11.2* 10.5* 10.8* 11.1*  HCT 32.6* 32.0* 32.6* 31.8* 32.3* 33.1*  MCV 97.9  --  96.4 97.0 97.3 97.4  PLT 218  --  198 250 230 035   Basic Metabolic Panel: Recent Labs  Lab 09/01/18 1043 09/01/18 1050 09/02/18 0710 09/04/18 0502  NA 138 138 137 132*  K 4.0 4.0 5.5* 5.0  CL 94* 95* 94* 92*  CO2 29  --  27 24  GLUCOSE 88 87 86 75  BUN 60* 55* 76* 75*  CREATININE 10.82* 12.00* 13.12* 12.45*  CALCIUM 8.5*  --  8.5* 8.6*  PHOS  --   --  8.5* 5.9*   GFR: Estimated Creatinine Clearance: 6.6 mL/min (A) (by C-G formula based on SCr of 12.45 mg/dL (H)). Liver Function Tests: Recent Labs  Lab 09/02/18 0710 09/04/18 0502  ALBUMIN 3.3* 3.4*   No results for input(s): LIPASE, AMYLASE in the last 168 hours. No results for input(s): AMMONIA in the last 168 hours. Coagulation Profile: No results for input(s): INR, PROTIME in the last 168 hours. Cardiac Enzymes: Recent Labs  Lab 09/01/18 1406 09/01/18 1915 09/02/18 0229  TROPONINI 0.03* 0.03* 0.03*   BNP (last 3 results) No results for input(s): PROBNP in the last 8760 hours. HbA1C: No results for input(s): HGBA1C in the last 72 hours. CBG: No results for input(s): GLUCAP in the last 168 hours. Lipid Profile: Recent Labs    09/04/18 0502  CHOL 222*  HDL 50  LDLCALC 153*  TRIG 95  CHOLHDL 4.4   Thyroid Function Tests: No results for input(s): TSH, T4TOTAL, FREET4, T3FREE, THYROIDAB in the last 72 hours. Anemia Panel: No results for input(s): VITAMINB12, FOLATE, FERRITIN, TIBC, IRON, RETICCTPCT in the last 72 hours. Urine analysis:    Component Value Date/Time   COLORURINE YELLOW 05/12/2016 0834   APPEARANCEUR CLOUDY (A) 05/12/2016 0834   LABSPEC 1.014 05/12/2016 0834   PHURINE 5.0 05/12/2016 0834   GLUCOSEU NEGATIVE 05/12/2016 0834   HGBUR MODERATE (A) 05/12/2016 0834    BILIRUBINUR NEGATIVE 05/12/2016 0834   KETONESUR NEGATIVE 05/12/2016 0834   PROTEINUR 100 (A) 05/12/2016 0834   UROBILINOGEN 0.2 12/09/2013 0013   NITRITE NEGATIVE 05/12/2016 0834   LEUKOCYTESUR NEGATIVE 05/12/2016 0834   Sepsis Labs: @LABRCNTIP (procalcitonin:4,lacticidven:4)  ) Recent Results (from the past 240 hour(s))  MRSA PCR Screening     Status: None   Collection Time: 09/01/18  5:29 PM  Result Value Ref Range Status   MRSA by PCR NEGATIVE NEGATIVE Final    Comment:        The GeneXpert MRSA Assay (FDA approved for NASAL specimens only), is one component of a comprehensive MRSA colonization surveillance program. It is not intended to diagnose MRSA infection nor to guide or monitor treatment for MRSA infections. Performed at [facility], [place] [address] [place], [place] [address]       Studies: No results found.  Scheduled Meds: . aspirin EC  81 mg Oral Daily  . atorvastatin  80 mg Oral q1800  . calcitRIOL  0.75 mcg Oral Q T,Th,Sa-HD  . carvedilol  6.25 mg Oral BID WC  . Chlorhexidine Gluconate Cloth  6 each Topical Q0600  . Chlorhexidine Gluconate Cloth  6 each Topical Q0600  . cinacalcet  60 mg Oral Q T,Th,Sa-HD  . divalproex  500 mg Oral BID  . folic acid  1 mg Oral Daily  . isosorbide mononitrate  60 mg Oral Daily  . lanthanum  2,000 mg Oral TID WC  . multivitamin with minerals  1 tablet Oral Daily  . polyethylene glycol  17 g Oral Daily  . senna-docusate  1 tablet Oral BID  . sodium chloride flush  3 mL Intravenous Q12H  . thiamine  100 mg Oral Daily    Continuous Infusions: . sodium chloride    . heparin 1,150 Units/hr (09/04/18 2334)     LOS: 4 days     Kayleen Memos, MD [facility] Pager [phone]  If 7PM-7AM, please contact night-coverage [email or web address] Password [email or web address] 09/05/2018, 11:23 AM

## 2018-09-05 NOTE — Progress Notes (Signed)
[facility] Progress Note   Subjective:   CT surgery rec PCI due to comorbids > to be discussed tomorrow by cardiology in detail.  High risk PCI especially if he doesn't take meds.   Tol UF 2.5L yesterday.  No dialysis related complaints this AM. On heparin gtt.   Objective Vitals:   09/04/18 1954 09/04/18 2340 09/05/18 0427 09/05/18 0836  BP: 132/70 102/65 (!) 161/96 98/67  Pulse: 93 100 78 81  Resp: 20 20 20 17   Temp: 98.3 F (36.8 C) 98.2 F (36.8 C) 98.1 F (36.7 C) 98.6 F (37 C)  TempSrc: Oral Oral Oral Oral  SpO2: 100% 99% 100% 100%  Weight:   90.3 kg    Physical Exam General: eating full breakfast Heart: RRR, no rub Lungs: clear to bases Abdomen: soft Extremities: no edema Dialysis Access:  LUE AVF +t/b  Additional Objective Labs: Basic Metabolic Panel: Recent Labs  Lab 09/01/18 1043 09/01/18 1050 09/02/18 0710 09/04/18 0502  NA 138 138 137 132*  K 4.0 4.0 5.5* 5.0  CL 94* 95* 94* 92*  CO2 29  --  27 24  GLUCOSE 88 87 86 75  BUN 60* 55* 76* 75*  CREATININE 10.82* 12.00* 13.12* 12.45*  CALCIUM 8.5*  --  8.5* 8.6*  PHOS  --   --  8.5* 5.9*   Liver Function Tests: Recent Labs  Lab 09/02/18 0710 09/04/18 0502  ALBUMIN 3.3* 3.4*   No results for input(s): LIPASE, AMYLASE in the last 168 hours. CBC: Recent Labs  Lab 09/01/18 1043  09/02/18 0710 09/03/18 0435 09/04/18 0502 09/05/18 0732  WBC 7.2  --  6.8 6.6 7.5 8.0  HGB 10.9*   < > 11.2* 10.5* 10.8* 11.1*  HCT 32.6*   < > 32.6* 31.8* 32.3* 33.1*  MCV 97.9  --  96.4 97.0 97.3 97.4  PLT 218  --  198 250 230 240   < > = values in this interval not displayed.   Blood Culture    Component Value Date/Time   SDES URINE, CLEAN CATCH 05/12/2016 0824   SPECREQUEST NONE 05/12/2016 0824   CULT (A) 05/12/2016 0824    7,000 COLONIES/mL INSIGNIFICANT GROWTH Performed at Agency 05/13/2016 FINAL 05/12/2016 0824    Cardiac Enzymes: Recent Labs  Lab  09/01/18 1406 09/01/18 1915 09/02/18 0229  TROPONINI 0.03* 0.03* 0.03*   CBG: No results for input(s): GLUCAP in the last 168 hours. Iron Studies: No results for input(s): IRON, TIBC, TRANSFERRIN, FERRITIN in the last 72 hours. @lablastinr3 @ Studies/Results: Vas US Doppler Pre Cabg  Result Date: 09/03/2018 PREOPERATIVE VASCULAR EVALUATION  Indications: PRE CABG. Performing Technologist: Landry Mellow RDMS, RVT  Examination Guidelines: A complete evaluation includes B-mode imaging, spectral Doppler, color Doppler, and power Doppler as needed of all accessible portions of each vessel. Bilateral testing is considered an integral part of a complete examination. Limited examinations for reoccurring indications may be performed as noted.  Right Carotid Findings: +----------+--------+--------+--------+------------+--------+           PSV cm/sEDV cm/sStenosisDescribe    Comments +----------+--------+--------+--------+------------+--------+ CCA Prox  129     12              heterogenous         +----------+--------+--------+--------+------------+--------+ CCA Distal94      12                                   +----------+--------+--------+--------+------------+--------+  ICA Prox  25      8       1-39%   heterogenous         +----------+--------+--------+--------+------------+--------+ ECA       73      3                                    +----------+--------+--------+--------+------------+--------+ Portions of this table do not appear on this page. +----------+--------+-------+------------+------------+           PSV cm/sEDV cmsDescribe    Arm Pressure +----------+--------+-------+------------+------------+ Subclavian               Not assessed130          +----------+--------+-------+------------+------------+ +---------+--------+--------+--------------+ VertebralPSV cm/sEDV cm/sNot identified +---------+--------+--------+--------------+ technically difficult  due to patient movement Left Carotid Findings: +----------+--------+--------+--------+------------+--------+           PSV cm/sEDV cm/sStenosisDescribe    Comments +----------+--------+--------+--------+------------+--------+ CCA Prox  86      15              heterogenous         +----------+--------+--------+--------+------------+--------+ CCA Distal44      6                                    +----------+--------+--------+--------+------------+--------+ ICA Prox  26      12      1-39%   heterogenous         +----------+--------+--------+--------+------------+--------+ ICA Distal41      14                                   +----------+--------+--------+--------+------------+--------+ ECA       142     14                                   +----------+--------+--------+--------+------------+--------+ +----------+--------+--------+------------+------------+ SubclavianPSV cm/sEDV cm/sDescribe    Arm Pressure +----------+--------+--------+------------+------------+                           Not assessed             +----------+--------+--------+------------+------------+ +---------+--------+--+--------+--+---------+ VertebralPSV cm/s35EDV cm/s12Antegrade +---------+--------+--+--------+--+---------+ technically difficult due to patient movement ABI Findings: +--------+------------------+-----+-----------+--------+ Right   Rt Pressure (mmHg)IndexWaveform   Comment  +--------+------------------+-----+-----------+--------+ Brachial130                    triphasic           +--------+------------------+-----+-----------+--------+ ATA                            not audible         +--------+------------------+-----+-----------+--------+ PTA     87                0.67 monophasic          +--------+------------------+-----+-----------+--------+ +--------+------------------+-----+-----------+------------------------------+ Left    Lt Pressure  (mmHg)IndexWaveform   Comment                        +--------+------------------+-----+-----------+------------------------------+ Brachial  BP not taken due to AV fistula +--------+------------------+-----+-----------+------------------------------+ ATA                            not audible                               +--------+------------------+-----+-----------+------------------------------+ PTA     74                0.57 monophasic                                +--------+------------------+-----+-----------+------------------------------+  Right Doppler Findings: +--------+--------+-----+---------+--------+ Site    PressureIndexDoppler  Comments +--------+--------+-----+---------+--------+ Brachial130          triphasic         +--------+--------+-----+---------+--------+ Radial               triphasic         +--------+--------+-----+---------+--------+ Ulnar                triphasic         +--------+--------+-----+---------+--------+  Left Doppler Findings: +--------+--------+-----+-------+------------------------------+ Site    PressureIndexDopplerComments                       +--------+--------+-----+-------+------------------------------+ Brachial                    BP not taken due to AV fistula +--------+--------+-----+-------+------------------------------+ Radial                      not assessed                   +--------+--------+-----+-------+------------------------------+ Ulnar                       not assessed                   +--------+--------+-----+-------+------------------------------+  Summary: Right Carotid: Velocities in the right ICA are consistent with a 1-39% stenosis. Left Carotid: Velocities in the left ICA are consistent with a 1-39% stenosis. Right ABI: Resting right ankle-brachial index indicates moderate right lower extremity arterial disease. Left ABI: Resting left  ankle-brachial index indicates moderate left lower extremity arterial disease. Right Upper Extremity: Doppler waveforms decrease >50% with right radial compression. Doppler waveforms remain within normal limits with right ulnar compression. Left Upper Extremity: Left not evaluated due to AV fistula.  Electronically signed by Monica Martinez MD on 09/03/2018 at 5:01:40 PM.    Final    Medications: . sodium chloride    . heparin 1,150 Units/hr (09/04/18 2334)   . aspirin EC  81 mg Oral Daily  . atorvastatin  80 mg Oral q1800  . calcitRIOL  0.75 mcg Oral Q T,Th,Sa-HD  . carvedilol  6.25 mg Oral BID WC  . Chlorhexidine Gluconate Cloth  6 each Topical Q0600  . Chlorhexidine Gluconate Cloth  6 each Topical Q0600  . cinacalcet  60 mg Oral Q T,Th,Sa-HD  . divalproex  500 mg Oral BID  . folic acid  1 mg Oral Daily  . isosorbide mononitrate  60 mg Oral Daily  . lanthanum  2,000 mg Oral TID WC  . multivitamin with minerals  1 tablet Oral Daily  . polyethylene glycol  17 g Oral Daily  . senna-docusate  1 tablet Oral  BID  . sodium chloride flush  3 mL Intravenous Q12H  . thiamine  100 mg Oral Daily    Outpt HD orders: [facility] TTS 4.25hrs 180, 400/800, 2/2, 86.5 Last HD 11/5 - pre wt 96kg, UF 3.4, post weight 92.6 ran full tx. Post weights over past month - lowest was 88.8kg  Assessment/Plan: 1 Chest pain: Now on ACS heparin, 3v disease on LHC, PCI being considered.  2 ESRD: HD yesterday per outpt schedule.  EDW 86kg, hasn't been achieving.  Tol UF to 90.3kg yesterday.  Tol UF 2-2.5L/treatment here.    3. Anemia of ESRD: last hb 11.1 today, no ESA outpatient.  If < 10 start ESA . 4. Metabolic Bone Disease: corr ca ok - dec'd sensipar from 90 to 60s, 2.5ca dialysate, continue calcitriol 0.75 per outpt.  If phos remains in the 8s switch back to 2Ca bath. Phos runs in the 7-8s outpt - renal diet here, fosrenol 2g TID per outpatient dose > phos is 5.9 yesterday.   Jannifer Hick MD 09/05/2018, 8:36 AM   [facility] Pager: [phone]

## 2018-09-05 NOTE — Progress Notes (Signed)
Pt got out of bed and went to the bathroom unassisted  Staff responded to the room due to bed alarm  Pt tangled in IV tubing  Educated pt on importance of calling staff Pt is impulsive and has poor safety concerns at this time  Will continue to monitor

## 2018-09-05 NOTE — Progress Notes (Signed)
Ordered pt low bed  Pt on heparin drip and CIWA  Pt non compliant calling staff for assistance when ambulating

## 2018-09-05 NOTE — Progress Notes (Signed)
Pt took off HD dressing and site was bleeding  Placed new pressure dressing  Pt attempted to removed IV catheter and removed telemetry monitor  Educated pt on importance of IV for receiving Heparin and telemetry to monitor heart rhythm  Pt cannot verbalize understanding  Will continue to monitor

## 2018-09-05 NOTE — Progress Notes (Addendum)
ANTICOAGULATION CONSULT NOTE  Pharmacy Consult for heparin Indication: chest pain/ACS  No Known Allergies  Patient Measurements: Weight: 199 lb 1.6 oz (90.3 kg) Heparin Dosing Weight: 90kg  Vital Signs: Temp: 98.6 F (37 C) (11/10 0836) Temp Source: Oral (11/10 0836) BP: 98/67 (11/10 0836) Pulse Rate: 81 (11/10 0836)  Labs: Recent Labs    09/03/18 0435  09/03/18 1623 09/04/18 0502 09/05/18 0732 09/05/18 0738  HGB 10.5*  --   --  10.8* 11.1*  --   HCT 31.8*  --   --  32.3* 33.1*  --   PLT 250  --   --  230 240  --   HEPARINUNFRC  --    < > 0.38 0.38  --  0.34  CREATININE  --   --   --  12.45*  --   --    < > = values in this interval not displayed.    Estimated Creatinine Clearance: 6.6 mL/min (A) (by C-G formula based on SCr of 12.45 mg/dL (H)).   Assessment: 11 yoM with ESRD and HTN admitted with atypical CP now s/p LHC. Pt found to have significant 3-vessel CAD, not thought to be a surgical candidate. Continuing heparin per Cardiology plans.   11/10: Heparin IV was out between 2230-2330. Heparin level drawn appropriately on time, heparin level returned therapeutic this morning at 0.34; no bleeding noted. Hgb 11.1 and stable; hct 33.1 and stable. Platelets at 240, and stable. Continue heparin infusion at 1150 units / hr.   Goal of Therapy:  Heparin level 0.3-0.7 units/ml Monitor platelets by anticoagulation protocol: Yes   Plan:  Continue heparin infusion at 1150 units/hr Daily CBC and heparin level.  Thank you for allowing pharmacy to be a part of this patient's care.  Tamela Gammon, PharmD 09/05/2018 9:12 AM PGY-1 Pharmacy Resident Direct Phone: [phone] Please check [email or web address] for unit-specific pharmacist phone numbers

## 2018-09-05 NOTE — Progress Notes (Signed)
Pt states his back is aching  Pt states he takes tylenol at home  Paged MD to inform  MD gave verbal order Tylenol 650mg  q6 PRN

## 2018-09-06 LAB — BASIC METABOLIC PANEL
Anion gap: 15 (ref 5–15)
BUN: 69 mg/dL — AB (ref 8–23)
CALCIUM: 8.5 mg/dL — AB (ref 8.9–10.3)
CO2: 27 mmol/L (ref 22–32)
CREATININE: 11.96 mg/dL — AB (ref 0.61–1.24)
Chloride: 92 mmol/L — ABNORMAL LOW (ref 98–111)
GFR calc non Af Amer: 4 mL/min — ABNORMAL LOW (ref >60)
GFR, EST AFRICAN AMERICAN: 4 mL/min — AB (ref >60)
Glucose, Bld: 102 mg/dL — ABNORMAL HIGH (ref 70–99)
Potassium: 4.4 mmol/L (ref 3.5–5.1)
Sodium: 134 mmol/L — ABNORMAL LOW (ref 135–145)

## 2018-09-06 LAB — MAGNESIUM: Magnesium: 2.3 mg/dL (ref 1.7–2.4)

## 2018-09-06 LAB — CBC WITH DIFFERENTIAL/PLATELET
Abs Immature Granulocytes: 0.06 10*3/uL (ref 0.00–0.07)
BASOS ABS: 0.1 10*3/uL (ref 0.0–0.1)
Basophils Relative: 1 %
EOS ABS: 1 10*3/uL — AB (ref 0.0–0.5)
EOS PCT: 14 %
HEMATOCRIT: 31.3 % — AB (ref 39.0–52.0)
Hemoglobin: 10.3 g/dL — ABNORMAL LOW (ref 13.0–17.0)
Immature Granulocytes: 1 %
LYMPHS ABS: 2 10*3/uL (ref 0.7–4.0)
Lymphocytes Relative: 27 %
MCH: 32.3 pg (ref 26.0–34.0)
MCHC: 32.9 g/dL (ref 30.0–36.0)
MCV: 98.1 fL (ref 80.0–100.0)
MONO ABS: 1 10*3/uL (ref 0.1–1.0)
Monocytes Relative: 14 %
NRBC: 0 % (ref 0.0–0.2)
Neutro Abs: 3.2 10*3/uL (ref 1.7–7.7)
Neutrophils Relative %: 43 %
Platelets: 248 10*3/uL (ref 150–400)
RBC: 3.19 MIL/uL — ABNORMAL LOW (ref 4.22–5.81)
RDW: 13.9 % (ref 11.5–15.5)
WBC: 7.2 10*3/uL (ref 4.0–10.5)

## 2018-09-06 LAB — HEPARIN LEVEL (UNFRACTIONATED): Heparin Unfractionated: 0.26 IU/mL — ABNORMAL LOW (ref 0.30–0.70)

## 2018-09-06 MED ORDER — CHLORHEXIDINE GLUCONATE CLOTH 2 % EX PADS: 6.0000 | MEDICATED_PAD | Freq: Every day | CUTANEOUS | Status: DC

## 2018-09-06 MED ORDER — CLOPIDOGREL BISULFATE 75 MG PO TABS
75.0000 mg | ORAL_TABLET | Freq: Every day | ORAL | Status: DC
  Administered 2018-09-06 – 2018-09-07 (×2): 75 mg via ORAL
  Filled 2018-09-06 (×2): qty 1

## 2018-09-06 MED ORDER — CARVEDILOL 12.5 MG PO TABS
12.5000 mg | ORAL_TABLET | Freq: Two times a day (BID) | ORAL | Status: DC
  Administered 2018-09-06: 12.5 mg via ORAL
  Filled 2018-09-06: qty 1

## 2018-09-06 NOTE — Progress Notes (Signed)
PROGRESS NOTE  Kenneth [name] MWN:[id number] DOB: 04-21-1956 DOA: 09/01/2018 PCP: [name], MD  HPI/Recap of past 24 hours: 62 y.o.Kenneth [name] a hx of cardiomyopathy w/ prior EF of 25-30%, felt to be NICM given low risk Myoview in 2017, EF has since normalized on subsequent echos, diastolic HF, h/o heavy ETHO use, NSVT, atrial fibrillation/flutter previously felt to be a poor candidate for anticoagulation due to poor compliance and ETHO use, ESRD on HD (Tu,Th,Sa) and HTN admitted on 09/01/2018 with intermittent chest pains and found to have severe obstructive CAD, currently awaiting CT surgery consult for possible CABG, on IV heparin.  09/03/2018: Patient seen and examined at his bedside.  He denies chest pain, palpitations or dyspnea at rest.  Cardiology, CTS, and nephrology consulted and following.  CTS will make a decision whether the patient is a candidate for CABG.  Tentatively scheduled for next Friday, 09/10/2018.  Dr. Prescott Gum to evaluate.  09/05/18: No acute events overnight.  Denies chest pain, palpitations or dyspnea.  Three-vessel coronary artery disease on left heart cath.  Cardiology and CTS will discuss tomorrow and make decision about CABG.  09/06/18: No acute events overnight. Denies chest pain, palpitations or dyspnea.  No plan for surgery per cardiology due to multiple comorbidities, alcohol abuse, and noncompliance.  His multivessel coronary artery disease will be medically managed.  Assessment/Plan: Active Problems:   Chronic diastolic CHF (congestive heart failure) (HCC)   Alcoholism (HCC)   ACS (acute coronary syndrome) (HCC)   Unstable angina (HCC)  Chest pain, ACS ruled in, unstable angina Troponin remained flat at 0.03 Now chest pain-free No surgical intervention planned Cardiology following  Coronary artery disease status post PCI LHC done on 09/02/2018 revealed three-vessel disease No surgical intervention planned.  Stop heparin drip. Currently  on aspirin 81 mg daily, Plavix 75 mg daily Lipitor 80 mg daily, Coreg 12.5 mg twice daily, Imdur 60 mg daily.  Chronic diastolic CHF 2D echo done on 09/01/2018 revealed preserved LVEF 55 to 60% with grade 1 diastolic dysfunction with no regional wall motion abnormality and possibly small patent foramen ovale Cardiac medications as outlined above  End-stage renal disease on dialysis Tuesday Thursday and Saturday Hemodialysis as planned Nephrology following  Anemia of chronic disease secondary to end-stage renal disease on dialysis Hemoglobin stable at 10.3 No sign of overt bleeding  Hx of p-Afib Not a good candidate for anticoagulation due to alcohol abuse and noncompliance\ On IV heparin in anticipation for possible CABG  Hypertension Blood pressure stable Continue cardiac medications  History of alcohol abuse Continue folic acid and thiamine CIWA protocol in place    Code Status: full   Family Communication: none at bedside  Disposition Plan: home when clinically stable or when cardiology signs off.   Consultants:  Nephrology\  CT surgery  cardiology   Pharmacy  Procedures:  none  Antimicrobials:   none  DVT prophylaxis: Subcu heparin 3 times daily   Objective: Vitals:   09/06/18 0613 09/06/18 0740 09/06/18 0742 09/06/18 0833  BP: 102/73 (!) 184/166 (!) 201/179 122/71  Pulse: 95 79 79 77  Resp: 18 16 15    Temp: (!) 97.4 F (36.3 C) 97.6 F (36.4 C) 97.6 F (36.4 C)   TempSrc: Oral Oral Oral   SpO2: 100% 100% 100%   Weight: 91.4 kg       Intake/Output Summary (Last 24 hours) at 09/06/2018 1156 Last data filed at 09/06/2018 0931 Gross per 24 hour  Intake 1575.85 ml  Output 0  ml  Net 1575.85 ml   Filed Weights   09/04/18 1247 09/05/18 0427 09/06/18 0613  Weight: 89.4 kg 90.3 kg 91.4 kg    Exam:  . General: 62 y.o. year-old male well-developed well-nourished in no acute distress.  Alert and oriented x3. . Cardiovascular: Regular rate  and rhythm with no rubs or gallops.  No JVD or thyromegaly noted.  Marland Kitchen Respiratory: Rales at bases with no wheezes.. Good inspiratory effort. . Abdomen: Soft nontender nondistended with normal bowel sounds x4 quadrants. . Musculoskeletal: No lower extremity edema. 2/4 pulses in all 4 extremities. . Skin: No ulcerative lesions noted or rashes.  Left upper extremity fistula. Marland Kitchen Psychiatry: Mood is appropriate for condition and setting   Data Reviewed: CBC: Recent Labs  Lab 09/02/18 0710 09/03/18 0435 09/04/18 0502 09/05/18 0732 09/06/18 0731  WBC 6.8 6.6 7.5 8.0 7.2  NEUTROABS  --   --   --   --  3.2  HGB 11.2* 10.5* 10.8* 11.1* 10.3*  HCT 32.6* 31.8* 32.3* 33.1* 31.3*  MCV 96.4 97.0 97.3 97.4 98.1  PLT 198 250 230 240 109   Basic Metabolic Panel: Recent Labs  Lab 09/01/18 1043 09/01/18 1050 09/02/18 0710 09/04/18 0502 09/06/18 0731  NA 138 138 137 132* 134*  K 4.0 4.0 5.5* 5.0 4.4  CL 94* 95* 94* 92* 92*  CO2 29  --  27 24 27   GLUCOSE 88 87 86 75 102*  BUN 60* 55* 76* 75* 69*  CREATININE 10.82* 12.00* 13.12* 12.45* 11.96*  CALCIUM 8.5*  --  8.5* 8.6* 8.5*  MG  --   --   --   --  2.3  PHOS  --   --  8.5* 5.9*  --    GFR: Estimated Creatinine Clearance: 6.8 mL/min (A) (by C-G formula based on SCr of 11.96 mg/dL (H)). Liver Function Tests: Recent Labs  Lab 09/02/18 0710 09/04/18 0502  ALBUMIN 3.3* 3.4*   No results for input(s): LIPASE, AMYLASE in the last 168 hours. No results for input(s): AMMONIA in the last 168 hours. Coagulation Profile: No results for input(s): INR, PROTIME in the last 168 hours. Cardiac Enzymes: Recent Labs  Lab 09/01/18 1406 09/01/18 1915 09/02/18 0229  TROPONINI 0.03* 0.03* 0.03*   BNP (last 3 results) No results for input(s): PROBNP in the last 8760 hours. HbA1C: No results for input(s): HGBA1C in the last 72 hours. CBG: No results for input(s): GLUCAP in the last 168 hours. Lipid Profile: Recent Labs    09/04/18 0502  CHOL  222*  HDL 50  LDLCALC 153*  TRIG 95  CHOLHDL 4.4   Thyroid Function Tests: No results for input(s): TSH, T4TOTAL, FREET4, T3FREE, THYROIDAB in the last 72 hours. Anemia Panel: No results for input(s): VITAMINB12, FOLATE, FERRITIN, TIBC, IRON, RETICCTPCT in the last 72 hours. Urine analysis:    Component Value Date/Time   COLORURINE YELLOW 05/12/2016 0834   APPEARANCEUR CLOUDY (A) 05/12/2016 0834   LABSPEC 1.014 05/12/2016 0834   PHURINE 5.0 05/12/2016 0834   GLUCOSEU NEGATIVE 05/12/2016 0834   HGBUR MODERATE (A) 05/12/2016 0834   BILIRUBINUR NEGATIVE 05/12/2016 0834   KETONESUR NEGATIVE 05/12/2016 0834   PROTEINUR 100 (A) 05/12/2016 0834   UROBILINOGEN 0.2 12/09/2013 0013   NITRITE NEGATIVE 05/12/2016 0834   LEUKOCYTESUR NEGATIVE 05/12/2016 0834   Sepsis Labs: @LABRCNTIP (procalcitonin:4,lacticidven:4)  ) Recent Results (from the past 240 hour(s))  MRSA PCR Screening     Status: None   Collection Time: 09/01/18  5:29 PM  Result Value Ref Range Status   MRSA by PCR NEGATIVE NEGATIVE Final    Comment:        The GeneXpert MRSA Assay (FDA approved for NASAL specimens only), is one component of a comprehensive MRSA colonization surveillance program. It is not intended to diagnose MRSA infection nor to guide or monitor treatment for MRSA infections. Performed at [facility], [place] [address] [place], [place] [address]       Studies: No results found.  Scheduled Meds: . aspirin EC  81 mg Oral Daily  . atorvastatin  80 mg Oral q1800  . calcitRIOL  0.75 mcg Oral Q T,Th,Sa-HD  . carvedilol  12.5 mg Oral BID WC  . Chlorhexidine Gluconate Cloth  6 each Topical Q0600  . Chlorhexidine Gluconate Cloth  6 each Topical Q0600  . cinacalcet  60 mg Oral Q T,Th,Sa-HD  . clopidogrel  75 mg Oral Daily  . divalproex  500 mg Oral BID  . folic acid  1 mg Oral Daily  . isosorbide mononitrate  60 mg Oral Daily  . lanthanum  2,000 mg Oral TID WC  . multivitamin with  minerals  1 tablet Oral Daily  . polyethylene glycol  17 g Oral Daily  . senna-docusate  1 tablet Oral BID  . sodium chloride flush  3 mL Intravenous Q12H  . thiamine  100 mg Oral Daily    Continuous Infusions: . sodium chloride       LOS: 5 days     Kayleen Memos, MD [facility] Pager [phone]  If 7PM-7AM, please contact night-coverage [email or web address] Password [email or web address] 09/06/2018, 11:56 AM

## 2018-09-06 NOTE — Progress Notes (Signed)
ANTICOAGULATION CONSULT NOTE  Pharmacy Consult for heparin Indication: chest pain/ACS  No Known Allergies  Patient Measurements: Weight: 201 lb 6.4 oz (91.4 kg) Heparin Dosing Weight: 90kg  Vital Signs: Temp: 97.6 F (36.4 C) (11/11 0742) Temp Source: Oral (11/11 0742) BP: 122/71 (11/11 0833) Pulse Rate: 77 (11/11 0833)  Labs: Recent Labs    09/04/18 0502 09/05/18 0732 09/05/18 0738 09/06/18 0731  HGB 10.8* 11.1*  --  10.3*  HCT 32.3* 33.1*  --  31.3*  PLT 230 240  --  248  HEPARINUNFRC 0.38  --  0.34 0.26*  CREATININE 12.45*  --   --  11.96*    Estimated Creatinine Clearance: 6.8 mL/min (A) (by C-G formula based on SCr of 11.96 mg/dL (H)).   Assessment: 8 yoM with ESRD and HTN admitted with atypical CP now s/p LHC. Pt found to have significant 3-vessel CAD, not thought to be a surgical candidate. He also has a history of afib and not a candidate for anticoagulation due to alcohol abuse and noncompliance. Continuing heparin per Cardiology plans. -heparin level = 0.26 on 1150 units/hr     Goal of Therapy:  Heparin level 0.3-0.7 units/ml Monitor platelets by anticoagulation protocol: Yes   Plan:  -Increase heparin to 1300 units/hr -Daily heparin level and CBC  Hildred Laser, PharmD Clinical Pharmacist **Pharmacist phone directory can now be found on [email or web address] (PW [email or web address]).  Listed under [place].

## 2018-09-06 NOTE — Progress Notes (Addendum)
[facility] Progress Note   Subjective:   Pt is somnolent, arousable.  No new c/o's.    Objective Vitals:   09/06/18 0740 09/06/18 0742 09/06/18 0833 09/06/18 1247  BP: (!) 184/166 (!) 201/179 122/71 133/70  Pulse: 79 79 77 72  Resp: 16 15  18   Temp: 97.6 F (36.4 C) 97.6 F (36.4 C)  (!) 97.5 F (36.4 C)  TempSrc: Oral Oral  Oral  SpO2: 100% 100%  100%  Weight:       Physical Exam General: lying in bed, somnolent Heart: RRR, no rub Lungs: clear to bases Abdomen: soft Extremities: no edema Dialysis Access:  LUE AVF +t/b  Additional Objective Labs: Basic Metabolic Panel: Recent Labs  Lab 09/02/18 0710 09/04/18 0502 09/06/18 0731  NA 137 132* 134*  K 5.5* 5.0 4.4  CL 94* 92* 92*  CO2 27 24 27   GLUCOSE 86 75 102*  BUN 76* 75* 69*  CREATININE 13.12* 12.45* 11.96*  CALCIUM 8.5* 8.6* 8.5*  PHOS 8.5* 5.9*  --    Liver Function Tests: Recent Labs  Lab 09/02/18 0710 09/04/18 0502  ALBUMIN 3.3* 3.4*   No results for input(s): LIPASE, AMYLASE in the last 168 hours. CBC: Recent Labs  Lab 09/02/18 0710 09/03/18 0435 09/04/18 0502 09/05/18 0732 09/06/18 0731  WBC 6.8 6.6 7.5 8.0 7.2  NEUTROABS  --   --   --   --  3.2  HGB 11.2* 10.5* 10.8* 11.1* 10.3*  HCT 32.6* 31.8* 32.3* 33.1* 31.3*  MCV 96.4 97.0 97.3 97.4 98.1  PLT 198 250 230 240 248   Blood Culture    Component Value Date/Time   SDES URINE, CLEAN CATCH 05/12/2016 0824   SPECREQUEST NONE 05/12/2016 0824   CULT (A) 05/12/2016 0824    7,000 COLONIES/mL INSIGNIFICANT GROWTH Performed at [place] 05/13/2016 FINAL 05/12/2016 0824    Cardiac Enzymes: Recent Labs  Lab 09/01/18 1406 09/01/18 1915 09/02/18 0229  TROPONINI 0.03* 0.03* 0.03*   CBG: No results for input(s): GLUCAP in the last 168 hours. Iron Studies: No results for input(s): IRON, TIBC, TRANSFERRIN, FERRITIN in the last 72 hours. @lablastinr3 @ Studies/Results: No results  found. Medications: . sodium chloride     . aspirin EC  81 mg Oral Daily  . atorvastatin  80 mg Oral q1800  . calcitRIOL  0.75 mcg Oral Q T,Th,Sa-HD  . carvedilol  12.5 mg Oral BID WC  . Chlorhexidine Gluconate Cloth  6 each Topical Q0600  . Chlorhexidine Gluconate Cloth  6 each Topical Q0600  . cinacalcet  60 mg Oral Q T,Th,Sa-HD  . clopidogrel  75 mg Oral Daily  . divalproex  500 mg Oral BID  . folic acid  1 mg Oral Daily  . isosorbide mononitrate  60 mg Oral Daily  . lanthanum  2,000 mg Oral TID WC  . multivitamin with minerals  1 tablet Oral Daily  . polyethylene glycol  17 g Oral Daily  . senna-docusate  1 tablet Oral BID  . sodium chloride flush  3 mL Intravenous Q12H  . thiamine  100 mg Oral Daily    Outpt HD orders: [facility] TTS  4h 10min   2/2 bath  86.5kg   LUE AVF  Heparin none Last HD 11/5 - pre wt 96kg, UF 3.4, post weight 92.6 ran full tx. Post weights over past month - lowest was 88.8kg  Assessment/Plan: 1 Chest pain: 3v disease on LHC, not good candidate for surg/ PCI.  Medical Rx recommended.  2 ESRD: HD TTS. HD tomorrow.  3. Anemia of ESRD: last hb 10.3, no ESA outpatient.  If < 10 start ESA . 4. Metabolic Bone Disease: corr ca ok - dec'd sensipar from 90 to 60s, 2.5ca dialysate, continue calcitriol 0.75 per outpt.  If phos remains in the 8s switch back to 2Ca bath. Phos runs in the 7-8s outpt - renal diet here, fosrenol 2g TID per outpatient dose > phos is 5.9 yesterday.  5. Volume - is up 3-5kg by wts but may be gaining wt    Kelly Splinter MD [employer] pgr [phone]   09/06/2018, 3:48 PM

## 2018-09-06 NOTE — Progress Notes (Addendum)
Progress Note  Patient Name: Kenneth [name] Date of Encounter: 09/06/2018  Primary Cardiologist:   Peter [name], MD   Subjective   Pt feels well this morning, no cp or SOB.  Denies PND   Inpatient Medications    Scheduled Meds: . aspirin EC  81 mg Oral Daily  . atorvastatin  80 mg Oral q1800  . calcitRIOL  0.75 mcg Oral Q T,Th,Sa-HD  . carvedilol  6.25 mg Oral BID WC  . Chlorhexidine Gluconate Cloth  6 each Topical Q0600  . Chlorhexidine Gluconate Cloth  6 each Topical Q0600  . cinacalcet  60 mg Oral Q T,Th,Sa-HD  . divalproex  500 mg Oral BID  . folic acid  1 mg Oral Daily  . isosorbide mononitrate  60 mg Oral Daily  . lanthanum  2,000 mg Oral TID WC  . multivitamin with minerals  1 tablet Oral Daily  . polyethylene glycol  17 g Oral Daily  . senna-docusate  1 tablet Oral BID  . sodium chloride flush  3 mL Intravenous Q12H  . thiamine  100 mg Oral Daily   Continuous Infusions: . sodium chloride    . heparin 1,150 Units/hr (09/04/18 2334)   PRN Meds: sodium chloride, acetaminophen, HYDROmorphone (DILAUDID) injection, nitroGLYCERIN, ondansetron **OR** ondansetron (ZOFRAN) IV, sodium chloride flush, zolpidem   Vital Signs    Vitals:   09/06/18 0613 09/06/18 0740 09/06/18 0742 09/06/18 0833  BP: 102/73 (!) 184/166 (!) 201/179 122/71  Pulse: 95 79 79 77  Resp: 18 16 15    Temp: (!) 97.4 F (36.3 C) 97.6 F (36.4 C) 97.6 F (36.4 C)   TempSrc: Oral Oral Oral   SpO2: 100% 100% 100%   Weight: 91.4 kg       Intake/Output Summary (Last 24 hours) at 09/06/2018 0849 Last data filed at 09/06/2018 0600 Gross per 24 hour  Intake 1215.85 ml  Output 0 ml  Net 1215.85 ml   Filed Weights   09/04/18 1247 09/05/18 0427 09/06/18 0613  Weight: 89.4 kg 90.3 kg 91.4 kg    Telemetry    NSR, PACs - Personally Reviewed  ECG    No new tracings- Personally Reviewed  Physical Exam   GEN: No  acute distress.   Cardiac: RRR, no murmurs, rubs, or gallops.    Respiratory: coarse breath sounds bilaterally worse in bases, no rales or wheezing GI: Soft, nontender, non-distended, normal bowel sounds  Extremities:  No LE edema bilaterally Psych: Oriented and appropriate   Labs    Chemistry Recent Labs  Lab 09/01/18 1043 09/01/18 1050 09/02/18 0710 09/04/18 0502  NA 138 138 137 132*  K 4.0 4.0 5.5* 5.0  CL 94* 95* 94* 92*  CO2 29  --  27 24  GLUCOSE 88 87 86 75  BUN 60* 55* 76* 75*  CREATININE 10.82* 12.00* 13.12* 12.45*  CALCIUM 8.5*  --  8.5* 8.6*  ALBUMIN  --   --  3.3* 3.4*  GFRNONAA 4*  --  3* 4*  GFRAA 5*  --  4* 4*  ANIONGAP 15  --  16* 16*     Hematology Recent Labs  Lab 09/04/18 0502 09/05/18 0732 09/06/18 0731  WBC 7.5 8.0 7.2  RBC 3.32* 3.40* 3.19*  HGB 10.8* 11.1* 10.3*  HCT 32.3* 33.1* 31.3*  MCV 97.3 97.4 98.1  MCH 32.5 32.6 32.3  MCHC 33.4 33.5 32.9  RDW 14.0 14.0 13.9  PLT 230 240 248    Cardiac Enzymes Recent Labs  Lab  09/01/18 1406 09/01/18 1915 09/02/18 0229  TROPONINI 0.03* 0.03* 0.03*    Recent Labs  Lab 09/01/18 1048  TROPIPOC 0.04     BNPNo results for input(s): BNP, PROBNP in the last 168 hours.   DDimer No results for input(s): DDIMER in the last 168 hours.   Radiology    No results found.  Cardiac Studies   Cardiac cath  Diagnostic Diagram        Patient Profile     62 y.o. male with a hx of cardiomyopathy w/ prior EF of 25-30%, felt to be NICM given low risk Myoview in 2017, EF has since normalized on subsequent echos, diastolic HF, h/o heavy ETHO use, NSVT, atrial fibrillation/flutter previously felt to be a poor candidate for anticoagulation due to poor compliance and ETHO use, ESRD on HD (Tu,Th,Sa) and HTN, who was found to have 3 vessel CAD.  Felt on consult with Dr. Prescott Gum not to be a surgical candidate.     Assessment & Plan   Stable angina/CAD: multivessel disease, not thought to be a surgical candidate.   PCI risk is very high.  Currently chest pain  free.  No cp in the last few days.  -We reviewed the patient's cath images and discussed the options.  For now we will manage the patient medically and can revisit interventional strategies at a later date if the patient desires. -will titrate up carvedilol to 12.5mg  BID, continue atorvastatin 80, IMDUR 60mg .  -d/c heparin, add plavix 75mg  daily   ESRD:  TuThSa dialysis  -Per nephrology.    HTN:  Continue imdur 60, carvedilol will titrate up this afternoon to 12.5mg  BID   Signed, Vickki Muff MD PGY-2 Internal Medicine

## 2018-09-07 ENCOUNTER — Encounter (HOSPITAL_COMMUNITY): Payer: Medicare Other

## 2018-09-07 LAB — CBC
HEMATOCRIT: 28.9 % — AB (ref 39.0–52.0)
Hemoglobin: 9.7 g/dL — ABNORMAL LOW (ref 13.0–17.0)
MCH: 32.2 pg (ref 26.0–34.0)
MCHC: 33.6 g/dL (ref 30.0–36.0)
MCV: 96 fL (ref 80.0–100.0)
NRBC: 0 % (ref 0.0–0.2)
PLATELETS: 262 10*3/uL (ref 150–400)
RBC: 3.01 MIL/uL — ABNORMAL LOW (ref 4.22–5.81)
RDW: 14 % (ref 11.5–15.5)
WBC: 6.6 10*3/uL (ref 4.0–10.5)

## 2018-09-07 LAB — RENAL FUNCTION PANEL
ALBUMIN: 3.2 g/dL — AB (ref 3.5–5.0)
Anion gap: 16 — ABNORMAL HIGH (ref 5–15)
BUN: 90 mg/dL — AB (ref 8–23)
CALCIUM: 8.5 mg/dL — AB (ref 8.9–10.3)
CHLORIDE: 91 mmol/L — AB (ref 98–111)
CO2: 23 mmol/L (ref 22–32)
CREATININE: 14.28 mg/dL — AB (ref 0.61–1.24)
GFR calc non Af Amer: 3 mL/min — ABNORMAL LOW (ref >60)
GFR, EST AFRICAN AMERICAN: 4 mL/min — AB (ref >60)
Glucose, Bld: 77 mg/dL (ref 70–99)
Phosphorus: 3.4 mg/dL (ref 2.5–4.6)
Potassium: 5.7 mmol/L — ABNORMAL HIGH (ref 3.5–5.1)
Sodium: 130 mmol/L — ABNORMAL LOW (ref 135–145)

## 2018-09-07 MED ORDER — ATORVASTATIN CALCIUM 80 MG PO TABS: 80.0000 mg | ORAL_TABLET | Freq: Every day | ORAL | 0 refills | Status: DC

## 2018-09-07 MED ORDER — ISOSORBIDE MONONITRATE ER 60 MG PO TB24: 60.0000 mg | ORAL_TABLET | Freq: Every day | ORAL | 0 refills | Status: DC

## 2018-09-07 MED ORDER — CALCITRIOL 0.5 MCG PO CAPS
ORAL_CAPSULE | ORAL | Status: AC
  Filled 2018-09-07: qty 1

## 2018-09-07 MED ORDER — CLOPIDOGREL BISULFATE 75 MG PO TABS: 75.0000 mg | ORAL_TABLET | Freq: Every day | ORAL | 0 refills | Status: DC

## 2018-09-07 MED ORDER — CARVEDILOL 12.5 MG PO TABS: 12.5000 mg | ORAL_TABLET | Freq: Two times a day (BID) | ORAL | 0 refills | Status: DC

## 2018-09-07 MED ORDER — LANTHANUM CARBONATE 1000 MG PO CHEW: 2000.0000 mg | CHEWABLE_TABLET | Freq: Three times a day (TID) | ORAL | 0 refills | Status: DC

## 2018-09-07 MED ORDER — CINACALCET HCL 30 MG PO TABS: 60.0000 mg | ORAL_TABLET | ORAL | 0 refills | Status: DC

## 2018-09-07 MED ORDER — ASPIRIN 81 MG PO TBEC: 81.0000 mg | DELAYED_RELEASE_TABLET | Freq: Every day | ORAL | 0 refills | Status: DC

## 2018-09-07 MED ORDER — CALCITRIOL 0.25 MCG PO CAPS: 0.7500 ug | ORAL_CAPSULE | ORAL | 0 refills | Status: DC

## 2018-09-07 MED ORDER — CALCITRIOL 0.25 MCG PO CAPS
ORAL_CAPSULE | ORAL | Status: AC
  Filled 2018-09-07: qty 1

## 2018-09-07 NOTE — Progress Notes (Signed)
[facility] Progress Note   Subjective:   Pt more alert today, seen on HD.    Objective Vitals:   09/07/18 1100 09/07/18 1130 09/07/18 1136 09/07/18 1250  BP: (!) 99/53 (!) 92/56 (!) 109/54 122/69  Pulse: 69 70 66 77  Resp:   20 18  Temp:   97.8 F (36.6 C) 97.7 F (36.5 C)  TempSrc:   Oral Oral  SpO2:   96% 100%  Weight:   88.3 kg    Physical Exam General: on HD now, no distress Heart: RRR, no rub Lungs: clear to bases Abdomen: soft Extremities: no edema Dialysis Access:  LUE AVF +t/b  Additional Objective Labs: Basic Metabolic Panel: Recent Labs  Lab 09/02/18 0710 09/04/18 0502 09/06/18 0731 09/07/18 0730  NA 137 132* 134* 130*  K 5.5* 5.0 4.4 5.7*  CL 94* 92* 92* 91*  CO2 27 24 27 23   GLUCOSE 86 75 102* 77  BUN 76* 75* 69* 90*  CREATININE 13.12* 12.45* 11.96* 14.28*  CALCIUM 8.5* 8.6* 8.5* 8.5*  PHOS 8.5* 5.9*  --  3.4   Liver Function Tests: Recent Labs  Lab 09/02/18 0710 09/04/18 0502 09/07/18 0730  ALBUMIN 3.3* 3.4* 3.2*   No results for input(s): LIPASE, AMYLASE in the last 168 hours. CBC: Recent Labs  Lab 09/03/18 0435 09/04/18 0502 09/05/18 0732 09/06/18 0731 09/07/18 0511  WBC 6.6 7.5 8.0 7.2 6.6  NEUTROABS  --   --   --  3.2  --   HGB 10.5* 10.8* 11.1* 10.3* 9.7*  HCT 31.8* 32.3* 33.1* 31.3* 28.9*  MCV 97.0 97.3 97.4 98.1 96.0  PLT 250 230 240 248 262   Blood Culture    Component Value Date/Time   SDES URINE, CLEAN CATCH 05/12/2016 0824   SPECREQUEST NONE 05/12/2016 0824   CULT (A) 05/12/2016 0824    7,000 COLONIES/mL INSIGNIFICANT GROWTH Performed at [place] 05/13/2016 FINAL 05/12/2016 0824    Cardiac Enzymes: Recent Labs  Lab 09/01/18 1406 09/01/18 1915 09/02/18 0229  TROPONINI 0.03* 0.03* 0.03*   CBG: No results for input(s): GLUCAP in the last 168 hours. Iron Studies: No results for input(s): IRON, TIBC, TRANSFERRIN, FERRITIN in the last 72  hours. @lablastinr3 @ Studies/Results: No results found. Medications: . sodium chloride     . aspirin EC  81 mg Oral Daily  . atorvastatin  80 mg Oral q1800  . calcitRIOL      . calcitRIOL      . calcitRIOL  0.75 mcg Oral Q T,Th,Sa-HD  . carvedilol  12.5 mg Oral BID WC  . Chlorhexidine Gluconate Cloth  6 each Topical Q0600  . Chlorhexidine Gluconate Cloth  6 each Topical Q0600  . Chlorhexidine Gluconate Cloth  6 each Topical Q0600  . cinacalcet  60 mg Oral Q T,Th,Sa-HD  . clopidogrel  75 mg Oral Daily  . divalproex  500 mg Oral BID  . folic acid  1 mg Oral Daily  . isosorbide mononitrate  60 mg Oral Daily  . lanthanum  2,000 mg Oral TID WC  . multivitamin with minerals  1 tablet Oral Daily  . polyethylene glycol  17 g Oral Daily  . senna-docusate  1 tablet Oral BID  . sodium chloride flush  3 mL Intravenous Q12H  . thiamine  100 mg Oral Daily    Dialysis: [facility] TTS  4h 54min   2/2 bath  86.5kg   LUE AVF  Heparin none Last HD 11/5 - pre  wt 96kg, UF 3.4, post weight 92.6 ran full tx. Post weights over past month - lowest was 88.8kg  Assessment/Plan: 1 Chest pain: 3v disease on LHC, not good candidate for surg/ PCI.  Medical Rx recommended.   2 ESRD: HD TTS. HD today.  3. Anemia of ESRD: last hb 10.3, no ESA outpatient.  If < 10 start ESA . 4. Metabolic Bone Disease: corr ca ok - dec'd sensipar from 90 to 60s, 2.5ca dialysate, continue calcitriol 0.75 per outpt.  If phos remains in the 8s switch back to 2Ca bath. Phos runs in the 7-8s outpt - renal diet here, fosrenol 2g TID per outpatient dose > phos is 5.9 yesterday.  5. Volume - may be gaining wt , possibly Janee Morn wt ~88kg   Kelly Splinter MD [employer] pgr [phone]   09/07/2018, 1:11 PM

## 2018-09-07 NOTE — Discharge Summary (Signed)
Discharge Summary  Kenneth [name] YHC:[id number] DOB: 01-13-1956  PCP: Kenneth Ruddy, MD  Admit date: 09/01/2018 Discharge date: 09/07/2018  Time spent: 35 minutes  Recommendations for Outpatient Follow-up:  1. Follow up with cardiology 2. Follow up with PCP 3. Take your medications as prescribed   Discharge Diagnoses:  Active Hospital Problems   Diagnosis Date Noted  . Unstable angina ([place])   . ACS (acute coronary syndrome) ([place]) 09/01/2018  . ESRD (end stage renal disease) ([place]) 07/20/2018  . Alcoholism ([place]) 05/13/2016  . Chronic diastolic CHF (congestive heart failure) ([place]) 09/24/2011    Resolved Hospital Problems  No resolved problems to display.    Discharge Condition: Stable   Diet recommendation: resume previous diet, renal diet   Vitals:   09/07/18 1136 09/07/18 1250  BP: (!) 109/54 122/69  Pulse: 66 77  Resp: 20 18  Temp: 97.8 F (36.6 C) 97.7 F (36.5 C)  SpO2: 96% 100%    History of present illness:  62 y.o.[name] a hx of cardiomyopathy w/ prior EF of 25-30%, felt to be NICM given low risk Myoview in 2017, EF has since normalized on subsequent echos, diastolic HF, h/o heavy ETHO use, NSVT, atrial fibrillation/flutter previously felt to be a poor candidate for anticoagulation due to poor compliance and ETHO use, ESRD on HD (Tu,Th,Sa) and HTNadmitted on 09/01/2018 with intermittent chest pains and found to have severe obstructive CAD, currently awaiting CT surgery consult for possible CABG, on IV heparin. No plan for surgery per cardiology due to multiple comorbidities, alcohol abuse, and noncompliance.  His multivessel coronary artery disease will be medically managed.  09/07/18: seen and examined at his bedside. No acute events overnight. No new complaints. Denies chest pain, dyspnea, or palpitations.   On the day of discharge, the patient was hemodynamically stable and will need to follow up with cardiology and pcp post  hospitalization.  Hospital Course:  Active Problems:   Chronic diastolic CHF (congestive heart failure) ([facility])   Alcoholism ([facility])   ESRD (end stage renal disease) ([facility])   ACS (acute coronary syndrome) ([facility])   Unstable angina ([facility])  Chest pain, ACS ruled in, unstable angina Troponin remained flat at 0.03 Now chest pain-free No surgical intervention planned Follow up with cardiology outpatient  Coronary artery disease status post PCI LHC done on 09/02/2018 revealed three-vessel disease No surgical intervention planned.  Stop heparin drip. Currently on aspirin 81 mg daily, Plavix 75 mg daily Lipitor 80 mg daily, Coreg 12.5 mg twice daily, Imdur 60 mg daily.  Chronic diastolic CHF 2D echo done on 09/01/2018 revealed preserved LVEF 55 to 60% with grade 1 diastolic dysfunction with no regional wall motion abnormality and possibly small patent foramen ovale Cardiac medications as outlined above  End-stage renal disease on dialysis Tuesday Thursday and Saturday C/w Hemodialysis as planned Last HD 09/07/18 while in the hospital  Anemia of chronic disease secondary to end-stage renal disease on dialysis Hemoglobin stable at 10.3 No sign of overt bleeding  Hx of p-Afib Not a good candidate for anticoagulation due to alcohol abuse and noncompliance\ On IV heparin initially which was dc  Hypertension Blood pressure stable Continue cardiac medications  History of alcohol abuse Continue folic acid and thiamine CIWA protocol in place    Code Status: full    Consultants:  Nephrology\  CT surgery  cardiology   Pharmacy  Procedures:  none  Antimicrobials:   none    Discharge Exam: BP 122/69 (BP Location: Right Arm)   Pulse  77   Temp 97.7 F (36.5 C) (Oral)   Resp 18   Wt 88.3 kg   SpO2 100%   BMI 26.40 kg/m  . General: 62 y.o. year-old male well developed well nourished in no acute distress.  Alert and oriented x3. . Cardiovascular: Regular  rate and rhythm with no rubs or gallops.  No thyromegaly or JVD noted.   Marland Kitchen Respiratory: Clear to auscultation with no wheezes or rales. Good inspiratory effort. . Abdomen: Soft nontender nondistended with normal bowel sounds x4 quadrants. . Musculoskeletal: No lower extremity edema. 2/4 pulses in all 4 extremities. . Skin: No ulcerative lesions noted or rashes, . Psychiatry: Mood is appropriate for condition and setting  Discharge Instructions You were cared for by a hospitalist during your hospital stay. If you have any questions about your discharge medications or the care you received while you were in the hospital after you are discharged, you can call the unit and asked to speak with the hospitalist on call if the hospitalist that took care of you is not available. Once you are discharged, your primary care physician will handle any further medical issues. Please note that NO REFILLS for any discharge medications will be authorized once you are discharged, as it is imperative that you return to your primary care physician (or establish a relationship with a primary care physician if you do not have one) for your aftercare needs so that they can reassess your need for medications and monitor your lab values.   Allergies as of 09/07/2018   No Known Allergies     Medication List    TAKE these medications   aspirin 81 MG EC tablet Take 1 tablet (81 mg total) by mouth daily. Start taking on:  09/08/2018   atorvastatin 80 MG tablet Commonly known as:  LIPITOR Take 1 tablet (80 mg total) by mouth daily at 6 PM.   calcitRIOL 0.25 MCG capsule Commonly known as:  ROCALTROL Take 3 capsules (0.75 mcg total) by mouth Every Tuesday,Thursday,and Saturday with dialysis. Start taking on:  09/09/2018   carvedilol 12.5 MG tablet Commonly known as:  COREG Take 1 tablet (12.5 mg total) by mouth 2 (two) times daily with a meal.   cinacalcet 30 MG tablet Commonly known as:  SENSIPAR Take 2 tablets  (60 mg total) by mouth Every Tuesday,Thursday,and Saturday with dialysis.   clopidogrel 75 MG tablet Commonly known as:  PLAVIX Take 1 tablet (75 mg total) by mouth daily. Start taking on:  09/08/2018   divalproex 500 MG DR tablet Commonly known as:  DEPAKOTE Take 1 tablet (500 mg total) by mouth 2 (two) times daily.   folic acid 1 MG tablet Commonly known as:  FOLVITE Take 1 tablet (1 mg total) by mouth daily.   isosorbide mononitrate 60 MG 24 hr tablet Commonly known as:  IMDUR Take 1 tablet (60 mg total) by mouth daily. Start taking on:  09/08/2018   lanthanum 1000 MG chewable tablet Commonly known as:  FOSRENOL Chew 2 tablets (2,000 mg total) by mouth 3 (three) times daily with meals.   multivitamin with minerals Tabs tablet Take 1 tablet by mouth daily.   nitroGLYCERIN 0.4 MG SL tablet Commonly known as:  NITROSTAT Place 1 tablet (0.4 mg total) under the tongue every 5 (five) minutes as needed for chest pain.   thiamine 100 MG tablet Take 1 tablet (100 mg total) by mouth daily.      No Known Allergies Follow-up Information  [name], [name], MD Follow up in 2 week(s).   Specialty:  Cardiology Why:  Patient has an appointment on November 25th 3pm with PA Contact information: [address] [place] [address] [phone]        Kenneth Ruddy, MD. Call in 1 day(s).   Specialty:  Family Medicine Why:  please call for a post hospital follow up appointment Contact information: [place] [address] [phone]            The results of significant diagnostics from this hospitalization (including imaging, microbiology, ancillary and laboratory) are listed below for reference.    Significant Diagnostic Studies: Dg Chest 2 View  Result Date: 08/11/2018 CLINICAL DATA:  Chest pain since 10 p.m. tonight. History of CHF and dialysis. EXAM: CHEST - 2 VIEW COMPARISON:  08/10/2018 FINDINGS: The heart size and  mediastinal contours are within normal limits. Both lungs are clear. The visualized skeletal structures are unremarkable. IMPRESSION: No active cardiopulmonary disease. Electronically Signed   By: Lucienne Capers M.D.   On: 08/11/2018 02:09   Dg Chest 2 View  Result Date: 08/10/2018 CLINICAL DATA:  Left-sided chest pain EXAM: CHEST - 2 VIEW COMPARISON:  07/20/2018, 05/11/2018 FINDINGS: No focal opacity or pleural effusion. Borderline cardiomegaly. Aortic atherosclerosis. No pneumothorax. IMPRESSION: No active cardiopulmonary disease. Electronically Signed   By: Donavan Foil M.D.   On: 08/10/2018 01:34   Dg Chest Port 1 View  Result Date: 09/01/2018 CLINICAL DATA:  Left-sided chest pain. EXAM: PORTABLE CHEST 1 VIEW COMPARISON:  08/11/2018 and 08/10/2018 FINDINGS: Heart size and pulmonary vascularity are normal the lungs are clear. Calcification and slight tortuosity of the thoracic aorta. No appreciable effusions. No significant bone abnormality. IMPRESSION: No acute abnormality. Aortic Atherosclerosis (ICD10-I70.0). Electronically Signed   By: Lorriane Shire M.D.   On: 09/01/2018 11:05   Vas US Doppler Pre Cabg  Result Date: 09/03/2018 PREOPERATIVE VASCULAR EVALUATION  Indications: PRE CABG. Performing Technologist: Landry Mellow RDMS, RVT  Examination Guidelines: A complete evaluation includes B-mode imaging, spectral Doppler, color Doppler, and power Doppler as needed of all accessible portions of each vessel. Bilateral testing is considered an integral part of a complete examination. Limited examinations for reoccurring indications may be performed as noted.  Right Carotid Findings: +----------+--------+--------+--------+------------+--------+           PSV cm/sEDV cm/sStenosisDescribe    Comments +----------+--------+--------+--------+------------+--------+ CCA Prox  129     12              heterogenous         +----------+--------+--------+--------+------------+--------+ CCA  Distal94      12                                   +----------+--------+--------+--------+------------+--------+ ICA Prox  25      8       1-39%   heterogenous         +----------+--------+--------+--------+------------+--------+ ECA       73      3                                    +----------+--------+--------+--------+------------+--------+ Portions of this table do not appear on this page. +----------+--------+-------+------------+------------+           PSV cm/sEDV cmsDescribe    Arm Pressure +----------+--------+-------+------------+------------+ Subclavian  Not assessed130          +----------+--------+-------+------------+------------+ +---------+--------+--------+--------------+ VertebralPSV cm/sEDV cm/sNot identified +---------+--------+--------+--------------+ technically difficult due to patient movement Left Carotid Findings: +----------+--------+--------+--------+------------+--------+           PSV cm/sEDV cm/sStenosisDescribe    Comments +----------+--------+--------+--------+------------+--------+ CCA Prox  86      15              heterogenous         +----------+--------+--------+--------+------------+--------+ CCA Distal44      6                                    +----------+--------+--------+--------+------------+--------+ ICA Prox  26      12      1-39%   heterogenous         +----------+--------+--------+--------+------------+--------+ ICA Distal41      14                                   +----------+--------+--------+--------+------------+--------+ ECA       142     14                                   +----------+--------+--------+--------+------------+--------+ +----------+--------+--------+------------+------------+ SubclavianPSV cm/sEDV cm/sDescribe    Arm Pressure +----------+--------+--------+------------+------------+                           Not assessed              +----------+--------+--------+------------+------------+ +---------+--------+--+--------+--+---------+ VertebralPSV cm/s35EDV cm/s12Antegrade +---------+--------+--+--------+--+---------+ technically difficult due to patient movement ABI Findings: +--------+------------------+-----+-----------+--------+ Right   Rt Pressure (mmHg)IndexWaveform   Comment  +--------+------------------+-----+-----------+--------+ Brachial130                    triphasic           +--------+------------------+-----+-----------+--------+ ATA                            not audible         +--------+------------------+-----+-----------+--------+ PTA     87                0.67 monophasic          +--------+------------------+-----+-----------+--------+ +--------+------------------+-----+-----------+------------------------------+ Left    Lt Pressure (mmHg)IndexWaveform   Comment                        +--------+------------------+-----+-----------+------------------------------+ Brachial                                  BP not taken due to AV fistula +--------+------------------+-----+-----------+------------------------------+ ATA                            not audible                               +--------+------------------+-----+-----------+------------------------------+ PTA     74                0.57 monophasic                                +--------+------------------+-----+-----------+------------------------------+  Right Doppler Findings: +--------+--------+-----+---------+--------+ Site    PressureIndexDoppler  Comments +--------+--------+-----+---------+--------+ [id number]          triphasic         +--------+--------+-----+---------+--------+ Radial               triphasic         +--------+--------+-----+---------+--------+ Ulnar                triphasic         +--------+--------+-----+---------+--------+  Left Doppler Findings:  +--------+--------+-----+-------+------------------------------+ Site    PressureIndexDopplerComments                       +--------+--------+-----+-------+------------------------------+ Brachial                    BP not taken due to AV fistula +--------+--------+-----+-------+------------------------------+ Radial                      not assessed                   +--------+--------+-----+-------+------------------------------+ Ulnar                       not assessed                   +--------+--------+-----+-------+------------------------------+  Summary: Right Carotid: Velocities in the right ICA are consistent with a 1-39% stenosis. Left Carotid: Velocities in the left ICA are consistent with a 1-39% stenosis. Right ABI: Resting right ankle-brachial index indicates moderate right lower extremity arterial disease. Left ABI: Resting left ankle-brachial index indicates moderate left lower extremity arterial disease. Right Upper Extremity: Doppler waveforms decrease >50% with right radial compression. Doppler waveforms remain within normal limits with right ulnar compression. Left Upper Extremity: Left not evaluated due to AV fistula.  Electronically signed by Monica Martinez MD on 09/03/2018 at 5:01:40 PM.    Final     Microbiology: Recent Results (from the past 240 hour(s))  MRSA PCR Screening     Status: None   Collection Time: 09/01/18  5:29 PM  Result Value Ref Range Status   MRSA by PCR NEGATIVE NEGATIVE Final    Comment:        The GeneXpert MRSA Assay (FDA approved for NASAL specimens only), is one component of a comprehensive MRSA colonization surveillance program. It is not intended to diagnose MRSA infection nor to guide or monitor treatment for MRSA infections. Performed at [facility], [place] [address], [place], [place] [address]      Labs: Basic Metabolic Panel: Recent Labs  Lab 09/01/18 1043 09/01/18 1050 09/02/18 0710 09/04/18 0502  09/06/18 0731 09/07/18 0730  NA 138 138 137 132* 134* 130*  K 4.0 4.0 5.5* 5.0 4.4 5.7*  CL 94* 95* 94* 92* 92* 91*  CO2 29  --  27 24 27 23   GLUCOSE 88 87 86 75 102* 77  BUN 60* 55* 76* 75* 69* 90*  CREATININE 10.82* 12.00* 13.12* 12.45* 11.96* 14.28*  CALCIUM 8.5*  --  8.5* 8.6* 8.5* 8.5*  MG  --   --   --   --  2.3  --   PHOS  --   --  8.5* 5.9*  --  3.4   Liver Function Tests: Recent Labs  Lab 09/02/18 0710 09/04/18 0502 09/07/18 0730  ALBUMIN 3.3* 3.4* 3.2*   No results for input(s): LIPASE, AMYLASE in the last 168 hours. No results for  input(s): AMMONIA in the last 168 hours. CBC: Recent Labs  Lab 09/03/18 0435 09/04/18 0502 09/05/18 0732 09/06/18 0731 09/07/18 0511  WBC 6.6 7.5 8.0 7.2 6.6  NEUTROABS  --   --   --  3.2  --   HGB 10.5* 10.8* 11.1* 10.3* 9.7*  HCT 31.8* 32.3* 33.1* 31.3* 28.9*  MCV 97.0 97.3 97.4 98.1 96.0  PLT 250 230 240 248 262   Cardiac Enzymes: Recent Labs  Lab 09/01/18 1406 09/01/18 1915 09/02/18 0229  TROPONINI 0.03* 0.03* 0.03*   BNP: BNP (last 3 results) Recent Labs    02/16/18 0648 05/11/18 1014 08/10/18 0336  BNP 1,594.1* 2,701.6* 678.7*    ProBNP (last 3 results) No results for input(s): PROBNP in the last 8760 hours.  CBG: No results for input(s): GLUCAP in the last 168 hours.     Signed:  Kayleen Memos, MD [facility] 09/07/2018, 2:19 PM

## 2018-09-07 NOTE — Discharge Instructions (Addendum)
Nonspecific Chest Pain Chest pain can be caused by many different conditions. There is a chance that your pain could be related to something serious, such as a heart attack or a blood clot in your lungs. Chest pain can also be caused by conditions that are not life-threatening. If you have chest pain, it is very important to follow up with your doctor. Follow these instructions at home: Medicines  If you were prescribed an antibiotic medicine, take it as told by your doctor. Do not stop taking the antibiotic even if you start to feel better.  Take over-the-counter and prescription medicines only as told by your doctor. Lifestyle  Do not use any products that contain nicotine or tobacco, such as cigarettes and e-cigarettes. If you need help quitting, ask your doctor.  Do not drink alcohol.  Make lifestyle changes as told by your doctor. These may include: ? Getting regular exercise. Ask your doctor for some activities that are safe for you. ? Eating a heart-healthy diet. A diet specialist (dietitian) can help you to learn healthy eating options. ? Staying at a healthy weight. ? Managing diabetes, if needed. ? Lowering your stress, as with deep breathing or spending time in nature. General instructions  Avoid any activities that make you feel chest pain.  If your chest pain is because of heartburn: ? Raise (elevate) the head of your bed about 6 inches (15 cm). You can do this by putting blocks under the bed legs at the head of the bed. ? Do not sleep with extra pillows under your head. That does not help heartburn.  Keep all follow-up visits as told by your doctor. This is important. This includes any further testing if your chest pain does not go away. Contact a doctor if:  Your chest pain does not go away.  You have a rash with blisters on your chest.  You have a fever.  You have chills. Get help right away if:  Your chest pain is worse.  You have a cough that gets  worse, or you cough up blood.  You have very bad (severe) pain in your belly (abdomen).  You are very weak.  You pass out (faint).  You have either of these for no clear reason: ? Sudden chest discomfort. ? Sudden discomfort in your arms, back, neck, or jaw.  You have shortness of breath at any time.  You suddenly start to sweat, or your skin gets clammy.  You feel sick to your stomach (nauseous).  You throw up (vomit).  You suddenly feel light-headed or dizzy.  Your heart starts to beat fast, or it feels like it is skipping beats. These symptoms may be an emergency. Do not wait to see if the symptoms will go away. Get medical help right away. Call your local emergency services (911 in the [place] Do not drive yourself to the hospital. This information is not intended to replace advice given to you by your health care provider. Make sure you discuss any questions you have with your health care provider. Document Released: 03/31/2008 Document Revised: 07/07/2016 Document Reviewed: 07/07/2016 Elsevier Interactive Patient Education  2017 [employer] Aspirin and Your Heart Aspirin is a medicine that affects the way blood clots. Aspirin can be used to help reduce the risk of blood clots, heart attacks, and other heart-related problems. Should I take aspirin? Your health care provider will help you determine whether it is safe and beneficial for you to take aspirin daily. Taking aspirin daily  may be beneficial if you:  Have had a heart attack or chest pain.  Have undergone open heart surgery such as coronary artery bypass surgery (CABG).  Have had coronary angioplasty.  Have experienced a stroke or transient ischemic attack (TIA).  Have peripheral vascular disease (PVD).  Have chronic heart rhythm problems such as atrial fibrillation.  Are there any risks of taking aspirin daily? Daily use of aspirin can increase your risk of side effects. Some of these include:  Bleeding.  Bleeding problems can be minor or serious. An example of a minor problem is a cut that does not stop bleeding. An example of a more serious problem is stomach bleeding or bleeding into the brain. Your risk of bleeding is increased if you are also taking non-steroidal anti-inflammatory medicine (NSAIDs).  Increased bruising.  Upset stomach.  An allergic reaction. People who have nasal polyps have an increased risk of developing an aspirin allergy.  What are some guidelines I should follow when taking aspirin?  Take aspirin only as directed by your health care provider. Make sure you understand how much you should take and what form you should take. The two forms of aspirin are: ? Non-enteric-coated. This type of aspirin does not have a coating and is absorbed quickly. Non-enteric-coated aspirin is usually recommended for people with chest pain. This type of aspirin also comes in a chewable form. ? Enteric-coated. This type of aspirin has a special coating that releases the medicine very slowly. Enteric-coated aspirin causes less stomach upset than non-enteric-coated aspirin. This type of aspirin should not be chewed or crushed.  Drink alcohol in moderation. Drinking alcohol increases your risk of bleeding. When should I seek medical care?  You have unusual bleeding or bruising.  You have stomach pain.  You have an allergic reaction. Symptoms of an allergic reaction include: ? Hives. ? Itchy skin. ? Swelling of the lips, tongue, or face.  You have ringing in your ears. When should I seek immediate medical care?  Your bowel movements are bloody, dark red, or black in color.  You vomit or cough up blood.  You have blood in your urine.  You cough, wheeze, or feel short of breath. If you have any of the following symptoms, this is an emergency. Do not wait to see if the pain will go away. Get medical help at once. Call your local emergency services (911 in the [place] Do not drive  yourself to the hospital.  You have severe chest pain, especially if the pain is crushing or pressure-like and spreads to the arms, back, neck, or jaw.  You have stroke-like symptoms, such as: ? Loss of vision. ? Difficulty talking. ? Numbness or weakness on one side of your body. ? Numbness or weakness in your arm or leg. ? Not thinking clearly or feeling confused.  This information is not intended to replace advice given to you by your health care provider. Make sure you discuss any questions you have with your health care provider. Document Released: 09/25/2008 Document Revised: 02/20/2016 Document Reviewed: 01/18/2014 Elsevier Interactive Patient Education  2018 [employer].

## 2018-09-07 NOTE — Progress Notes (Signed)
CARDIAC REHAB PHASE I   Pt for d/c. Briefly reviewed importance of Plavix, ASA, statin and NTG. Pt encouraged to take meds as ordered and ambulate as able. Will refer to CRP II [place], unsure if pt will be able to attend.  [id number] Rufina Falco, RN BSN 09/07/2018 3:10 PM

## 2018-09-07 NOTE — Clinical Social Work Note (Addendum)
Received call from RN. Daughter unable to transport patient home and cannot afford cab. Bus stop is too far from house for patient to walk. Address confirmed. Cab voucher filled out and given to RN.  CSW signing off.  Dayton Scrape, [place]

## 2018-09-07 NOTE — Progress Notes (Signed)
Upon returned, from dialysis, RN noticed patient fistula site looks really swollen, RN asked patient and he said that was not how it looked when he came to the hospital, RN called dialysis nurse to come and assess site, dialysis nurse confirned that the site looked that way during dialysis this morning and patient denied pain then. While we were still in patient room patient begin to complain of pain, but nephrologist came to assess patient but patient did not complain of any pain or discomfort, patient discharged home per MD.

## 2018-09-07 NOTE — Progress Notes (Addendum)
Progress Note  Patient Name: Kenneth [name] Date of Encounter: 09/07/2018  Primary Cardiologist:   Peter [name], MD   Subjective   No events overnight, denies any chest pain or dyspnea.  Denies PND   Inpatient Medications    Scheduled Meds: . aspirin EC  81 mg Oral Daily  . atorvastatin  80 mg Oral q1800  . calcitRIOL  0.75 mcg Oral Q T,Th,Sa-HD  . carvedilol  12.5 mg Oral BID WC  . Chlorhexidine Gluconate Cloth  6 each Topical Q0600  . Chlorhexidine Gluconate Cloth  6 each Topical Q0600  . Chlorhexidine Gluconate Cloth  6 each Topical Q0600  . cinacalcet  60 mg Oral Q T,Th,Sa-HD  . clopidogrel  75 mg Oral Daily  . divalproex  500 mg Oral BID  . folic acid  1 mg Oral Daily  . isosorbide mononitrate  60 mg Oral Daily  . lanthanum  2,000 mg Oral TID WC  . multivitamin with minerals  1 tablet Oral Daily  . polyethylene glycol  17 g Oral Daily  . senna-docusate  1 tablet Oral BID  . sodium chloride flush  3 mL Intravenous Q12H  . thiamine  100 mg Oral Daily   Continuous Infusions: . sodium chloride     PRN Meds: sodium chloride, acetaminophen, HYDROmorphone (DILAUDID) injection, nitroGLYCERIN, ondansetron **OR** ondansetron (ZOFRAN) IV, sodium chloride flush, zolpidem   Vital Signs    Vitals:   09/06/18 1247 09/06/18 1811 09/06/18 2016 09/07/18 0625  BP: 133/70 (!) 145/71 137/73 136/75  Pulse: 72 78 72 77  Resp: 18 17 16 20   Temp: (!) 97.5 F (36.4 C) 97.7 F (36.5 C) 97.9 F (36.6 C) (!) 97.3 F (36.3 C)  TempSrc: Oral Oral Oral Oral  SpO2: 100% 100% 100% 100%  Weight:    91.8 kg    Intake/Output Summary (Last 24 hours) at 09/07/2018 0740 Last data filed at 09/06/2018 2219 Gross per 24 hour  Intake 583 ml  Output 1 ml  Net 582 ml   Filed Weights   09/05/18 0427 09/06/18 0613 09/07/18 0625  Weight: 90.3 kg 91.4 kg 91.8 kg    Telemetry    NSR, PACs - Personally Reviewed  ECG    No new tracings- Personally Reviewed  Physical Exam   GEN:  No  acute distress.   Cardiac: RRR, 2/6 systolic murmur MR, no rubs or gallops.  Respiratory: coarse breath sounds bilaterally worse in bases, no rales or wheezing GI: Soft, nontender, non-distended, normal bowel sounds  Extremities:  No LE edema bilaterally Psych: Oriented and appropriate   Labs    Chemistry Recent Labs  Lab 09/02/18 0710 09/04/18 0502 09/06/18 0731  NA 137 132* 134*  K 5.5* 5.0 4.4  CL 94* 92* 92*  CO2 27 24 27   GLUCOSE 86 75 102*  BUN 76* 75* 69*  CREATININE 13.12* 12.45* 11.96*  CALCIUM 8.5* 8.6* 8.5*  ALBUMIN 3.3* 3.4*  --   GFRNONAA 3* 4* 4*  GFRAA 4* 4* 4*  ANIONGAP 16* 16* 15     Hematology Recent Labs  Lab 09/05/18 0732 09/06/18 0731 09/07/18 0511  WBC 8.0 7.2 6.6  RBC 3.40* 3.19* 3.01*  HGB 11.1* 10.3* 9.7*  HCT 33.1* 31.3* 28.9*  MCV 97.4 98.1 96.0  MCH 32.6 32.3 32.2  MCHC 33.5 32.9 33.6  RDW 14.0 13.9 14.0  PLT 240 248 262    Cardiac Enzymes Recent Labs  Lab 09/01/18 1406 09/01/18 1915 09/02/18 0229  TROPONINI 0.03* 0.03*  0.03*    Recent Labs  Lab 09/01/18 1048  TROPIPOC 0.04     BNPNo results for input(s): BNP, PROBNP in the last 168 hours.   DDimer No results for input(s): DDIMER in the last 168 hours.   Radiology    No results found.  Cardiac Studies   Cardiac cath  Diagnostic Diagram        Patient Profile     62 y.o. male with a hx of cardiomyopathy w/ prior EF of 25-30%, felt to be NICM given low risk Myoview in 2017, EF has since normalized on subsequent echos, diastolic HF, h/o heavy ETHO use, NSVT, atrial fibrillation/flutter previously felt to be a poor candidate for anticoagulation due to poor compliance and ETOH use, ESRD on HD (Tu,Th,Sa) and HTN, who was found to have 3 vessel CAD.  Felt on consult with Dr. Prescott Gum not to be a surgical candidate.     Assessment & Plan   Stable angina/CAD: multivessel disease, not thought to be a surgical candidate.   PCI risk is very high.  Currently  chest pain free.  No cp in the last few days.  -continue medical management -carvedilol increased to 12.5mg  BID yesterday, bp and HR tolerating well,  continue atorvastatin 80, IMDUR 60mg .  -continue asa and plavix 75mg  daily   ESRD:  TuThSa dialysis  -Per nephrology.    HTN:  Continue imdur 60, carvedilol now at 12.5mg  BID  Pt is medically stable for discharge.  He will be ready to go after dialysis.   Signed, Vickki Muff MD PGY-2 Internal Medicine

## 2018-09-07 NOTE — Care Management Note (Signed)
Case Management Note  Patient Details  Name: Kenneth [name] MRN: [id number] Date of Birth: 1955/11/22  Subjective/Objective:           CHF        Action/Plan: Patient lives at home with his daughter, she takes him to his apts; [name], MD; has private insurance with Medicare; pharmacy of choice is [facility]; [place] choice offered, pt chose [facility]; [place] with [facility] called for arrangements; DME - walker at home;  Expected Discharge Date:  09/07/18               Expected Discharge Plan:  [place]  Discharge planning Services  CM Consult  Choice offered to:  Patient  HH Arranged:  RN, PT [facility] Agency:  [facility]  Status of Service:  In process, will continue to follow  Sherrilyn Rist [phone] 09/07/2018, 2:31 PM

## 2018-09-07 NOTE — Progress Notes (Signed)
Discharge instructions reviewed with pt. Pt verbalizes understanding and states he has no questions. Pt belongings with pt. Pt is not in distress. Pt discharged via wheelchair. Pt given taxi voucher. RN called taxi to take pt home.

## 2018-09-07 NOTE — Evaluation (Signed)
Physical Therapy Evaluation Patient Details Name: Kenneth [name] MRN: [id number] DOB: 1956-09-16 Today's Date: 09/07/2018   History of Present Illness  62 y.o. AA male with a hx of cardiomyopathy w/ prior EF of 25-30%, felt to be NICM given low risk Myoview in 2017, EF has since normalized on subsequent echos, diastolic HF,  h/o heavy ETHO use,  NSVT, atrial fibrillation/flutter presented for angina  Clinical Impression  Patient seen for mobility assessment. Mobilizing well with modest instability but no overt LOB. Patient plan for d/c home, recommend HHPT upon discharge. No further acute PT needs. Will sign off.     Follow Up Recommendations Home health PT;Supervision for mobility/OOB    Equipment Recommendations  None recommended by PT    Recommendations for Other Services       Precautions / Restrictions Precautions Precautions: Fall      Mobility  Bed Mobility Overal bed mobility: Independent                Transfers Overall transfer level: Independent Equipment used: None                Ambulation/Gait Ambulation/Gait assistance: Supervision Gait Distance (Feet): 310 Feet Assistive device: None Gait Pattern/deviations: Drifts right/left;Narrow base of support     General Gait Details: some instability but no overt LOB  Stairs Stairs: Yes Stairs assistance: Supervision Stair Management: One rail Left Number of Stairs: 5 General stair comments: no physical assist required  Wheelchair Mobility    Modified Rankin (Stroke Patients Only)       Balance                                             Pertinent Vitals/Pain      Home Living Family/patient expects to be discharged to:: Private residence Living Arrangements: Children Available Help at Discharge: Family;Available 24 hours/day(daughter) Type of Home: Apartment Home Access: Level entry     Home Layout: One level Home Equipment: Walker - 2 wheels Additional  Comments: Information taken from prior admission. Pt confused and unable to verify.    Prior Function Level of Independence: Needs assistance   Gait / Transfers Assistance Needed: mod I for all gait  ADL's / Homemaking Assistance Needed: daughter helps with cooking and cleaning  Comments: Unsure of PLOF due to pt being poor historian. Pt reporting he performed ADLs PTA     Hand Dominance   Dominant Hand: Right    Extremity/Trunk Assessment                Communication      Cognition Arousal/Alertness: Awake/alert Behavior During Therapy: WFL for tasks assessed/performed Overall Cognitive Status: No family/caregiver present to determine baseline cognitive functioning                                        General Comments      Exercises     Assessment/Plan    PT Assessment All further PT needs can be met in the next venue of care  PT Problem List Decreased balance;Decreased activity tolerance;Decreased mobility       PT Treatment Interventions      PT Goals (Current goals can be found in the Care Plan section)  Acute Rehab PT Goals Patient Stated Goal: to go home  PT Goal Formulation: All assessment and education complete, DC therapy    Frequency     Barriers to discharge        Co-evaluation               AM-PAC PT "6 Clicks" Daily Activity  Outcome Measure Difficulty turning over in bed (including adjusting bedclothes, sheets and blankets)?: Unable Difficulty moving from lying on back to sitting on the side of the bed? : Unable Difficulty sitting down on and standing up from a chair with arms (e.g., wheelchair, bedside commode, etc,.)?: Unable Help needed moving to and from a bed to chair (including a wheelchair)?: A Little Help needed walking in hospital room?: A Little Help needed climbing 3-5 steps with a railing? : A Little 6 Click Score: 12    End of Session Equipment Utilized During Treatment: Gait belt Activity  Tolerance: Patient tolerated treatment well Patient left: in bed;with call bell/phone within reach Nurse Communication: Mobility status PT Visit Diagnosis: Muscle weakness (generalized) (M62.81);Unsteadiness on feet (R26.81)    Time: 8242-9980 PT Time Calculation (min) (ACUTE ONLY): 15 min   Charges:   PT Evaluation $PT Eval Low Complexity: [place], PT DPT  Board Certified Neurologic Specialist Acute Rehabilitation Services Pager [phone] Office [phone]   Duncan Dull 09/07/2018, 3:44 PM

## 2018-09-08 ENCOUNTER — Inpatient Hospital Stay: Payer: Medicare Other | Admitting: Family Medicine

## 2018-09-09 ENCOUNTER — Telehealth: Payer: Self-pay

## 2018-09-09 DIAGNOSIS — D509 Iron deficiency anemia, unspecified: Secondary | ICD-10-CM | POA: Diagnosis not present

## 2018-09-09 DIAGNOSIS — D631 Anemia in chronic kidney disease: Secondary | ICD-10-CM | POA: Diagnosis not present

## 2018-09-09 DIAGNOSIS — N186 End stage renal disease: Secondary | ICD-10-CM | POA: Diagnosis not present

## 2018-09-09 DIAGNOSIS — N2581 Secondary hyperparathyroidism of renal origin: Secondary | ICD-10-CM | POA: Diagnosis not present

## 2018-09-10 SURGERY — CORONARY ARTERY BYPASS GRAFTING (CABG)
Anesthesia: General | Site: Chest

## 2018-09-11 DIAGNOSIS — N2581 Secondary hyperparathyroidism of renal origin: Secondary | ICD-10-CM | POA: Diagnosis not present

## 2018-09-11 DIAGNOSIS — D509 Iron deficiency anemia, unspecified: Secondary | ICD-10-CM | POA: Diagnosis not present

## 2018-09-11 DIAGNOSIS — N186 End stage renal disease: Secondary | ICD-10-CM | POA: Diagnosis not present

## 2018-09-11 DIAGNOSIS — D631 Anemia in chronic kidney disease: Secondary | ICD-10-CM | POA: Diagnosis not present

## 2018-09-13 ENCOUNTER — Telehealth: Payer: Self-pay | Admitting: Family Medicine

## 2018-09-13 DIAGNOSIS — R0789 Other chest pain: Secondary | ICD-10-CM | POA: Diagnosis not present

## 2018-09-13 DIAGNOSIS — R809 Proteinuria, unspecified: Secondary | ICD-10-CM | POA: Diagnosis not present

## 2018-09-13 DIAGNOSIS — D709 Neutropenia, unspecified: Secondary | ICD-10-CM | POA: Diagnosis not present

## 2018-09-13 DIAGNOSIS — R079 Chest pain, unspecified: Secondary | ICD-10-CM | POA: Diagnosis not present

## 2018-09-13 DIAGNOSIS — N059 Unspecified nephritic syndrome with unspecified morphologic changes: Secondary | ICD-10-CM | POA: Diagnosis not present

## 2018-09-13 DIAGNOSIS — R001 Bradycardia, unspecified: Secondary | ICD-10-CM | POA: Diagnosis not present

## 2018-09-13 DIAGNOSIS — R0602 Shortness of breath: Secondary | ICD-10-CM | POA: Diagnosis not present

## 2018-09-13 DIAGNOSIS — I10 Essential (primary) hypertension: Secondary | ICD-10-CM | POA: Diagnosis not present

## 2018-09-13 NOTE — Telephone Encounter (Signed)
Copied from [place] [id number]. Topic: Quick Communication - Home Health Verbal Orders >> Sep 13, 2018  1:01 PM Judyann Munson wrote: Caller/Agency: [facility]  Callback Number: [phone] (secure vm) Requesting OT/PT/Skilled Nursing/Social Work: skilled nursing  Frequency:  Requesting Skilled nursing, they will be out to see the patient in 09-15-18

## 2018-09-14 DIAGNOSIS — D631 Anemia in chronic kidney disease: Secondary | ICD-10-CM | POA: Diagnosis not present

## 2018-09-14 DIAGNOSIS — N2581 Secondary hyperparathyroidism of renal origin: Secondary | ICD-10-CM | POA: Diagnosis not present

## 2018-09-14 DIAGNOSIS — N186 End stage renal disease: Secondary | ICD-10-CM | POA: Diagnosis not present

## 2018-09-14 DIAGNOSIS — D509 Iron deficiency anemia, unspecified: Secondary | ICD-10-CM | POA: Diagnosis not present

## 2018-09-15 ENCOUNTER — Telehealth: Payer: Self-pay | Admitting: Family Medicine

## 2018-09-15 ENCOUNTER — Telehealth: Payer: Self-pay | Admitting: Physician Assistant

## 2018-09-15 MED ORDER — NITROGLYCERIN 0.4 MG SL SUBL: 0.4000 mg | SUBLINGUAL_TABLET | SUBLINGUAL | 0 refills | Status: DC | PRN

## 2018-09-15 NOTE — Telephone Encounter (Signed)
ok 

## 2018-09-15 NOTE — Telephone Encounter (Signed)
New Message    *STAT* If patient is at the pharmacy, call can be transferred to refill team.   1. Which medications need to be refilled? (please list name of each medication and dose if known) nitroGLYCERIN (NITROSTAT) 0.4 MG SL tablet  2. Which pharmacy/location (including street and city if local pharmacy) is medication to be sent to? [place], [place] - [address] AT [place]  3. Do they need a 30 day or 90 day supply? [place]

## 2018-09-15 NOTE — Telephone Encounter (Signed)
Ok for verbal orders ?

## 2018-09-15 NOTE — Telephone Encounter (Signed)
Copied from [place] [id number]. Topic: Quick Communication - See Telephone Encounter >> Sep 15, 2018  2:34 PM Berneta Levins wrote: CRM for notification. See Telephone encounter for: 09/15/18.  Darlina Guys with [facility] calling.  States there has been another delay of start of care for pt and they will not able to go out to pt until 09/17/2018. Darlina Guys can be reached at [phone]

## 2018-09-16 DIAGNOSIS — N2581 Secondary hyperparathyroidism of renal origin: Secondary | ICD-10-CM | POA: Diagnosis not present

## 2018-09-16 DIAGNOSIS — N186 End stage renal disease: Secondary | ICD-10-CM | POA: Diagnosis not present

## 2018-09-16 DIAGNOSIS — D509 Iron deficiency anemia, unspecified: Secondary | ICD-10-CM | POA: Diagnosis not present

## 2018-09-16 DIAGNOSIS — D631 Anemia in chronic kidney disease: Secondary | ICD-10-CM | POA: Diagnosis not present

## 2018-09-16 NOTE — Telephone Encounter (Signed)
ok 

## 2018-09-16 NOTE — Telephone Encounter (Signed)
Tried calling to give verbal orders, mailbox full, unable to leave message.

## 2018-09-16 NOTE — Telephone Encounter (Signed)
FYI sent to Dr. Volanda Napoleon for review.

## 2018-09-17 ENCOUNTER — Encounter (HOSPITAL_COMMUNITY): Payer: Self-pay

## 2018-09-17 ENCOUNTER — Inpatient Hospital Stay (HOSPITAL_COMMUNITY)
Admission: EM | Admit: 2018-09-17 | Discharge: 2018-09-21 | DRG: 280 | Disposition: A | Discharge status: Home Health | Payer: Medicare Other | Attending: Internal Medicine | Admitting: Internal Medicine

## 2018-09-17 ENCOUNTER — Emergency Department (HOSPITAL_COMMUNITY): Payer: Medicare Other

## 2018-09-17 DIAGNOSIS — R0689 Other abnormalities of breathing: Secondary | ICD-10-CM | POA: Diagnosis not present

## 2018-09-17 DIAGNOSIS — I4891 Unspecified atrial fibrillation: Secondary | ICD-10-CM | POA: Diagnosis present

## 2018-09-17 DIAGNOSIS — Z7902 Long term (current) use of antithrombotics/antiplatelets: Secondary | ICD-10-CM

## 2018-09-17 DIAGNOSIS — Z515 Encounter for palliative care: Secondary | ICD-10-CM | POA: Diagnosis not present

## 2018-09-17 DIAGNOSIS — R9431 Abnormal electrocardiogram [ECG] [EKG]: Secondary | ICD-10-CM | POA: Diagnosis not present

## 2018-09-17 DIAGNOSIS — Z7982 Long term (current) use of aspirin: Secondary | ICD-10-CM

## 2018-09-17 DIAGNOSIS — I214 Non-ST elevation (NSTEMI) myocardial infarction: Secondary | ICD-10-CM

## 2018-09-17 DIAGNOSIS — R0602 Shortness of breath: Secondary | ICD-10-CM | POA: Diagnosis not present

## 2018-09-17 DIAGNOSIS — Z992 Dependence on renal dialysis: Secondary | ICD-10-CM | POA: Diagnosis not present

## 2018-09-17 DIAGNOSIS — N186 End stage renal disease: Secondary | ICD-10-CM | POA: Diagnosis present

## 2018-09-17 DIAGNOSIS — E8889 Other specified metabolic disorders: Secondary | ICD-10-CM | POA: Diagnosis present

## 2018-09-17 DIAGNOSIS — D631 Anemia in chronic kidney disease: Secondary | ICD-10-CM | POA: Diagnosis present

## 2018-09-17 DIAGNOSIS — I25110 Atherosclerotic heart disease of native coronary artery with unstable angina pectoris: Secondary | ICD-10-CM | POA: Diagnosis present

## 2018-09-17 DIAGNOSIS — I200 Unstable angina: Secondary | ICD-10-CM | POA: Diagnosis not present

## 2018-09-17 DIAGNOSIS — Z9114 Patient's other noncompliance with medication regimen: Secondary | ICD-10-CM | POA: Diagnosis not present

## 2018-09-17 DIAGNOSIS — I5042 Chronic combined systolic (congestive) and diastolic (congestive) heart failure: Secondary | ICD-10-CM | POA: Diagnosis present

## 2018-09-17 DIAGNOSIS — Z87891 Personal history of nicotine dependence: Secondary | ICD-10-CM

## 2018-09-17 DIAGNOSIS — I25709 Atherosclerosis of coronary artery bypass graft(s), unspecified, with unspecified angina pectoris: Secondary | ICD-10-CM | POA: Diagnosis not present

## 2018-09-17 DIAGNOSIS — I2510 Atherosclerotic heart disease of native coronary artery without angina pectoris: Secondary | ICD-10-CM

## 2018-09-17 DIAGNOSIS — Z8673 Personal history of transient ischemic attack (TIA), and cerebral infarction without residual deficits: Secondary | ICD-10-CM

## 2018-09-17 DIAGNOSIS — I4892 Unspecified atrial flutter: Secondary | ICD-10-CM | POA: Diagnosis present

## 2018-09-17 DIAGNOSIS — F1010 Alcohol abuse, uncomplicated: Secondary | ICD-10-CM | POA: Diagnosis present

## 2018-09-17 DIAGNOSIS — Z7189 Other specified counseling: Secondary | ICD-10-CM | POA: Diagnosis not present

## 2018-09-17 DIAGNOSIS — N2581 Secondary hyperparathyroidism of renal origin: Secondary | ICD-10-CM | POA: Diagnosis not present

## 2018-09-17 DIAGNOSIS — I213 ST elevation (STEMI) myocardial infarction of unspecified site: Principal | ICD-10-CM | POA: Diagnosis present

## 2018-09-17 DIAGNOSIS — N185 Chronic kidney disease, stage 5: Secondary | ICD-10-CM | POA: Diagnosis not present

## 2018-09-17 DIAGNOSIS — R079 Chest pain, unspecified: Secondary | ICD-10-CM | POA: Diagnosis not present

## 2018-09-17 DIAGNOSIS — I25119 Atherosclerotic heart disease of native coronary artery with unspecified angina pectoris: Secondary | ICD-10-CM | POA: Diagnosis not present

## 2018-09-17 DIAGNOSIS — R0902 Hypoxemia: Secondary | ICD-10-CM | POA: Diagnosis not present

## 2018-09-17 DIAGNOSIS — I132 Hypertensive heart and chronic kidney disease with heart failure and with stage 5 chronic kidney disease, or end stage renal disease: Secondary | ICD-10-CM | POA: Diagnosis present

## 2018-09-17 DIAGNOSIS — I429 Cardiomyopathy, unspecified: Secondary | ICD-10-CM | POA: Diagnosis present

## 2018-09-17 DIAGNOSIS — Z9119 Patient's noncompliance with other medical treatment and regimen: Secondary | ICD-10-CM

## 2018-09-17 DIAGNOSIS — Z79899 Other long term (current) drug therapy: Secondary | ICD-10-CM | POA: Diagnosis not present

## 2018-09-17 DIAGNOSIS — I2101 ST elevation (STEMI) myocardial infarction involving left main coronary artery: Secondary | ICD-10-CM

## 2018-09-17 DIAGNOSIS — R0789 Other chest pain: Secondary | ICD-10-CM | POA: Diagnosis not present

## 2018-09-17 DIAGNOSIS — Z66 Do not resuscitate: Secondary | ICD-10-CM | POA: Diagnosis present

## 2018-09-17 DIAGNOSIS — I120 Hypertensive chronic kidney disease with stage 5 chronic kidney disease or end stage renal disease: Secondary | ICD-10-CM | POA: Diagnosis not present

## 2018-09-17 DIAGNOSIS — R072 Precordial pain: Secondary | ICD-10-CM | POA: Diagnosis not present

## 2018-09-17 DIAGNOSIS — J439 Emphysema, unspecified: Secondary | ICD-10-CM | POA: Diagnosis not present

## 2018-09-17 LAB — CBC
HCT: 33.6 % — ABNORMAL LOW (ref 39.0–52.0)
Hemoglobin: 11 g/dL — ABNORMAL LOW (ref 13.0–17.0)
MCH: 33 pg (ref 26.0–34.0)
MCHC: 32.7 g/dL (ref 30.0–36.0)
MCV: 100.9 fL — AB (ref 80.0–100.0)
Platelets: 271 10*3/uL (ref 150–400)
RBC: 3.33 MIL/uL — ABNORMAL LOW (ref 4.22–5.81)
RDW: 15.2 % (ref 11.5–15.5)
WBC: 9.4 10*3/uL (ref 4.0–10.5)
nRBC: 0 % (ref 0.0–0.2)

## 2018-09-17 LAB — BASIC METABOLIC PANEL
ANION GAP: 13 (ref 5–15)
BUN: 34 mg/dL — ABNORMAL HIGH (ref 8–23)
CALCIUM: 7.7 mg/dL — AB (ref 8.9–10.3)
CHLORIDE: 94 mmol/L — AB (ref 98–111)
CO2: 28 mmol/L (ref 22–32)
CREATININE: 8.07 mg/dL — AB (ref 0.61–1.24)
GFR calc non Af Amer: 6 mL/min — ABNORMAL LOW (ref >60)
GFR, EST AFRICAN AMERICAN: 7 mL/min — AB (ref >60)
GLUCOSE: 109 mg/dL — AB (ref 70–99)
POTASSIUM: 4.4 mmol/L (ref 3.5–5.1)
Sodium: 135 mmol/L (ref 135–145)

## 2018-09-17 LAB — LIPID PANEL
Cholesterol: 204 mg/dL — ABNORMAL HIGH (ref 0–200)
HDL: 47 mg/dL (ref >40)
LDL Cholesterol: 120 mg/dL — ABNORMAL HIGH (ref 0–99)
Total CHOL/HDL Ratio: 4.3 RATIO
Triglycerides: 185 mg/dL — ABNORMAL HIGH (ref <150)
VLDL: 37 mg/dL (ref 0–40)

## 2018-09-17 LAB — TROPONIN I
Troponin I: <0.03 ng/mL (ref <0.03)
Troponin I: <0.03 ng/mL (ref <0.03)

## 2018-09-17 LAB — HEPARIN LEVEL (UNFRACTIONATED): Heparin Unfractionated: 0.39 IU/mL (ref 0.30–0.70)

## 2018-09-17 LAB — I-STAT TROPONIN, ED: Troponin i, poc: 0.02 ng/mL (ref 0.00–0.08)

## 2018-09-17 LAB — PROTIME-INR
INR: 1.05
Prothrombin Time: 13.6 seconds (ref 11.4–15.2)

## 2018-09-17 LAB — APTT: aPTT: 37 seconds — ABNORMAL HIGH (ref 24–36)

## 2018-09-17 MED ORDER — LIDOCAINE-PRILOCAINE 2.5-2.5 % EX CREA: 1.0000 "application " | TOPICAL_CREAM | CUTANEOUS | Status: DC | PRN

## 2018-09-17 MED ORDER — HEPARIN BOLUS VIA INFUSION
4000.0000 [IU] | Freq: Once | INTRAVENOUS | Status: AC
  Administered 2018-09-17: 4000 [IU]/h via INTRAVENOUS
  Filled 2018-09-17: qty 4000

## 2018-09-17 MED ORDER — ISOSORBIDE MONONITRATE ER 60 MG PO TB24
60.0000 mg | ORAL_TABLET | Freq: Every day | ORAL | Status: DC
  Administered 2018-09-17 – 2018-09-21 (×5): 60 mg via ORAL
  Filled 2018-09-17 (×3): qty 1
  Filled 2018-09-17: qty 2
  Filled 2018-09-17: qty 1

## 2018-09-17 MED ORDER — LIDOCAINE HCL (PF) 1 % IJ SOLN: 5.0000 mL | INTRAMUSCULAR | Status: DC | PRN

## 2018-09-17 MED ORDER — HEPARIN (PORCINE) 25000 UT/250ML-% IV SOLN
1350.0000 [IU]/h | INTRAVENOUS | Status: DC
  Administered 2018-09-17 (×2): 1200 [IU]/h via INTRAVENOUS
  Administered 2018-09-18: 1350 [IU]/h via INTRAVENOUS
  Filled 2018-09-17 (×3): qty 250

## 2018-09-17 MED ORDER — ASPIRIN 81 MG PO CHEW
324.0000 mg | CHEWABLE_TABLET | ORAL | Status: AC
  Filled 2018-09-17: qty 4

## 2018-09-17 MED ORDER — DIVALPROEX SODIUM 500 MG PO DR TAB
500.0000 mg | DELAYED_RELEASE_TABLET | Freq: Two times a day (BID) | ORAL | Status: DC
  Administered 2018-09-17 – 2018-09-21 (×9): 500 mg via ORAL
  Filled 2018-09-17 (×10): qty 1

## 2018-09-17 MED ORDER — SODIUM CHLORIDE 0.9 % IV SOLN: 100.0000 mL | INTRAVENOUS | Status: DC | PRN

## 2018-09-17 MED ORDER — LANTHANUM CARBONATE 500 MG PO CHEW
2000.0000 mg | CHEWABLE_TABLET | Freq: Three times a day (TID) | ORAL | Status: DC
  Administered 2018-09-17 – 2018-09-19 (×5): 2000 mg via ORAL
  Administered 2018-09-19: 500 mg via ORAL
  Administered 2018-09-19: 1500 mg via ORAL
  Administered 2018-09-20 – 2018-09-21 (×4): 2000 mg via ORAL
  Filled 2018-09-17 (×15): qty 4

## 2018-09-17 MED ORDER — ATORVASTATIN CALCIUM 80 MG PO TABS
80.0000 mg | ORAL_TABLET | Freq: Every day | ORAL | Status: DC
  Administered 2018-09-17 – 2018-09-20 (×4): 80 mg via ORAL
  Filled 2018-09-17 (×4): qty 1

## 2018-09-17 MED ORDER — HEPARIN (PORCINE) 25000 UT/250ML-% IV SOLN
INTRAVENOUS | Status: AC
  Administered 2018-09-17: 4000 [IU]/h via INTRAVENOUS
  Filled 2018-09-17: qty 250

## 2018-09-17 MED ORDER — ASPIRIN EC 81 MG PO TBEC
81.0000 mg | DELAYED_RELEASE_TABLET | Freq: Every day | ORAL | Status: DC
  Administered 2018-09-18 – 2018-09-21 (×4): 81 mg via ORAL
  Filled 2018-09-17 (×4): qty 1

## 2018-09-17 MED ORDER — SODIUM CHLORIDE 0.9 % IV SOLN
INTRAVENOUS | Status: DC
  Administered 2018-09-17: 08:00:00 via INTRAVENOUS

## 2018-09-17 MED ORDER — ONDANSETRON HCL 4 MG/2ML IJ SOLN: 4.0000 mg | Freq: Four times a day (QID) | INTRAMUSCULAR | Status: DC | PRN

## 2018-09-17 MED ORDER — CLOPIDOGREL BISULFATE 300 MG PO TABS
300.0000 mg | ORAL_TABLET | Freq: Once | ORAL | Status: AC
  Administered 2018-09-17: 300 mg via ORAL
  Filled 2018-09-17: qty 1

## 2018-09-17 MED ORDER — ACETAMINOPHEN 325 MG PO TABS: 650.0000 mg | ORAL_TABLET | ORAL | Status: DC | PRN

## 2018-09-17 MED ORDER — ASPIRIN 300 MG RE SUPP: 300.0000 mg | RECTAL | Status: AC

## 2018-09-17 MED ORDER — NITROGLYCERIN 0.4 MG SL SUBL
0.4000 mg | SUBLINGUAL_TABLET | SUBLINGUAL | Status: DC | PRN
  Administered 2018-09-18 – 2018-09-21 (×6): 0.4 mg via SUBLINGUAL
  Filled 2018-09-17 (×5): qty 1

## 2018-09-17 MED ORDER — NITROGLYCERIN IN D5W 200-5 MCG/ML-% IV SOLN
0.0000 ug/min | INTRAVENOUS | Status: DC
  Administered 2018-09-17: 5 ug/min via INTRAVENOUS
  Filled 2018-09-17: qty 250

## 2018-09-17 MED ORDER — CHLORHEXIDINE GLUCONATE CLOTH 2 % EX PADS
6.0000 | MEDICATED_PAD | Freq: Every day | CUTANEOUS | Status: DC
  Administered 2018-09-18: 6 via TOPICAL

## 2018-09-17 MED ORDER — ONDANSETRON HCL 4 MG/2ML IJ SOLN
4.0000 mg | Freq: Once | INTRAMUSCULAR | Status: AC
  Administered 2018-09-17: 4 mg via INTRAVENOUS

## 2018-09-17 MED ORDER — CLOPIDOGREL BISULFATE 75 MG PO TABS
75.0000 mg | ORAL_TABLET | Freq: Every day | ORAL | Status: DC
  Administered 2018-09-18 – 2018-09-21 (×4): 75 mg via ORAL
  Filled 2018-09-17 (×4): qty 1

## 2018-09-17 MED ORDER — ADULT MULTIVITAMIN W/MINERALS CH
1.0000 | ORAL_TABLET | Freq: Every day | ORAL | Status: DC
  Administered 2018-09-17 – 2018-09-21 (×5): 1 via ORAL
  Filled 2018-09-17 (×5): qty 1

## 2018-09-17 MED ORDER — HEPARIN SODIUM (PORCINE) 1000 UNIT/ML DIALYSIS: 1000.0000 [IU] | INTRAMUSCULAR | Status: DC | PRN

## 2018-09-17 MED ORDER — HEPARIN SODIUM (PORCINE) 5000 UNIT/ML IJ SOLN: 4000.0000 [IU] | Freq: Once | INTRAMUSCULAR | Status: DC

## 2018-09-17 MED ORDER — ONDANSETRON HCL 4 MG/2ML IJ SOLN
INTRAMUSCULAR | Status: AC
  Administered 2018-09-17: 4 mg via INTRAVENOUS
  Filled 2018-09-17: qty 2

## 2018-09-17 MED ORDER — CALCITRIOL 0.25 MCG PO CAPS
0.7500 ug | ORAL_CAPSULE | ORAL | Status: DC
  Administered 2018-09-18 – 2018-09-20 (×2): 0.75 ug via ORAL
  Filled 2018-09-17 (×3): qty 3

## 2018-09-17 MED ORDER — NITROGLYCERIN IN D5W 200-5 MCG/ML-% IV SOLN: 0.0000 ug/min | INTRAVENOUS | Status: DC

## 2018-09-17 MED ORDER — PENTAFLUOROPROP-TETRAFLUOROETH EX AERO
1.0000 "application " | INHALATION_SPRAY | CUTANEOUS | Status: DC | PRN
  Filled 2018-09-17: qty 116

## 2018-09-17 MED ORDER — ISOSORBIDE MONONITRATE ER 30 MG PO TB24: 30.0000 mg | ORAL_TABLET | Freq: Every day | ORAL | Status: DC

## 2018-09-17 MED ORDER — THIAMINE HCL 100 MG PO TABS: 100.0000 mg | ORAL_TABLET | Freq: Every day | ORAL | Status: DC

## 2018-09-17 MED ORDER — NITROGLYCERIN 0.4 MG SL SUBL: 0.4000 mg | SUBLINGUAL_TABLET | SUBLINGUAL | Status: DC | PRN

## 2018-09-17 MED ORDER — VITAMIN B-1 100 MG PO TABS
100.0000 mg | ORAL_TABLET | Freq: Every day | ORAL | Status: DC
  Administered 2018-09-17 – 2018-09-21 (×5): 100 mg via ORAL
  Filled 2018-09-17 (×5): qty 1

## 2018-09-17 MED ORDER — ATORVASTATIN CALCIUM 20 MG PO TABS: 20.0000 mg | ORAL_TABLET | Freq: Every day | ORAL | Status: DC

## 2018-09-17 MED ORDER — METOPROLOL SUCCINATE ER 25 MG PO TB24
25.0000 mg | ORAL_TABLET | Freq: Every day | ORAL | Status: DC
  Administered 2018-09-17 – 2018-09-21 (×5): 25 mg via ORAL
  Filled 2018-09-17 (×6): qty 1

## 2018-09-17 MED ORDER — FOLIC ACID 1 MG PO TABS
1.0000 mg | ORAL_TABLET | Freq: Every day | ORAL | Status: DC
  Administered 2018-09-17 – 2018-09-21 (×5): 1 mg via ORAL
  Filled 2018-09-17 (×5): qty 1

## 2018-09-17 MED ORDER — CINACALCET HCL 30 MG PO TABS
60.0000 mg | ORAL_TABLET | ORAL | Status: DC
  Administered 2018-09-18 – 2018-09-20 (×2): 60 mg via ORAL
  Filled 2018-09-17 (×5): qty 2

## 2018-09-17 MED ORDER — ALTEPLASE 2 MG IJ SOLR: 2.0000 mg | Freq: Once | INTRAMUSCULAR | Status: DC | PRN

## 2018-09-17 NOTE — Progress Notes (Signed)
   09/17/18 0700  Clinical Encounter Type  Visited With Patient  Visit Type Initial;ED   Responded to ED for STEMI, which was subsequently cancelled. Chaplain introduced to pt.    Myra Gianotti resident, [phone]

## 2018-09-17 NOTE — ED Triage Notes (Addendum)
Pt around 10pm last night had chest pain and went to sleep. Around 1am woke up with the chest pain that was on left side went to both arms. Pt took 1 nitro and went back to bed. Again at 5am started to have chest pain and called ems. Ems gave 1 nitro and 324 asa. Pt denies any pain at this time. Pt seemed to be SOB with the chest pain per ems. o2 stats were 91% on RA put on 3L went up to 97%. Pt is currently at 98% on RA. Pt is a dialysis pt that goes TTS with no problems.

## 2018-09-17 NOTE — Progress Notes (Signed)
ANTICOAGULATION CONSULT NOTE - Initial Consult  Pharmacy Consult for heparin Indication: chest pain/ACS  No Known Allergies  Patient Measurements: Height: 5\' 11"  (180.3 cm) Weight: 190 lb (86.2 kg) IBW/kg (Calculated) : 75.3 Heparin Dosing Weight: 86.2kg  Vital Signs: Temp: 97.7 F (36.5 C) (11/22 0607) Temp Source: Oral (11/22 0607) BP: 125/68 (11/22 0607) Pulse Rate: 79 (11/22 0607)  Labs: Recent Labs    09/17/18 0604  HGB 11.0*  HCT 33.6*  PLT 271  CREATININE 8.07*    Estimated Creatinine Clearance: 9.8 mL/min (A) (by C-G formula based on SCr of 8.07 mg/dL (H)).   Medical History: Past Medical History:  Diagnosis Date  . Acute CHF ([place]) 01/2018  . Cardiomyopathy secondary    likely related to HTN heart disease; possibly ETOH related as well  . Chronic combined systolic and diastolic heart failure (HCC)    Echocardiogram 09/22/11: Moderate LVH, EF 26-20%, grade 3 diastolic dysfunction, mild MR, moderate to severe LAE, mild RVE, mild to moderate TR, small to moderate pericardial effusion  . ESRD (end stage renal disease) on dialysis ([place])    due to hypertensive nephrosclerosis; TTS; [facility] [address] (09/01/2018)  . History of alcohol abuse   . Hypertension     Medications:  Infusions:  . sodium chloride    . heparin    . nitroGLYCERIN      Assessment: 65 yom presented to the ED with CP and SOB. To start IV heparin. Baseline Hgb is slightly low and platelets are WNL. Known history of afib but no anticoagulation due to non-compliance and EtOH abuse.   Goal of Therapy:  Heparin level 0.3-0.7 units/ml Monitor platelets by anticoagulation protocol: Yes   Plan:  Heparin bolus 4000 units IV x 1 Heparin gtt 1200 units/hr Check an 8 hr heparin level Daily heparin level and CBC  Jakson Delpilar, Rande Lawman 09/17/2018,7:23 AM

## 2018-09-17 NOTE — Progress Notes (Signed)
ANTICOAGULATION CONSULT NOTE  Pharmacy Consult for heparin Indication: chest pain/ACS  No Known Allergies  Patient Measurements: Height: 5\' 11"  (180.3 cm) Weight: 196 lb 3.4 oz (89 kg) IBW/kg (Calculated) : 75.3 Heparin Dosing Weight: 86.2kg  Vital Signs: Temp: 98.9 F (37.2 C) (11/22 1600) Temp Source: Oral (11/22 1600) BP: 92/59 (11/22 1600) Pulse Rate: 78 (11/22 1100)  Labs: Recent Labs    09/17/18 0604 09/17/18 1054 09/17/18 1539  HGB 11.0*  --   --   HCT 33.6*  --   --   PLT 271  --   --   APTT 37*  --   --   LABPROT 13.6  --   --   INR 1.05  --   --   HEPARINUNFRC  --   --  0.39  CREATININE 8.07*  --   --   TROPONINI  --  <0.03 <0.03    Estimated Creatinine Clearance: 9.8 mL/min (A) (by C-G formula based on SCr of 8.07 mg/dL (H)).   Medical History: Past Medical History:  Diagnosis Date  . Acute CHF ([place]) 01/2018  . Cardiomyopathy secondary    likely related to HTN heart disease; possibly ETOH related as well  . Chronic combined systolic and diastolic heart failure (HCC)    Echocardiogram 09/22/11: Moderate LVH, EF 57-26%, grade 3 diastolic dysfunction, mild MR, moderate to severe LAE, mild RVE, mild to moderate TR, small to moderate pericardial effusion  . ESRD (end stage renal disease) on dialysis ([place])    due to hypertensive nephrosclerosis; TTS; [facility] (09/01/2018)  . History of alcohol abuse   . Hypertension     Medications:  Infusions:  . sodium chloride 10 mL/hr at 09/17/18 0736  . sodium chloride    . sodium chloride    . heparin 1,200 Units/hr (09/17/18 0736)  . nitroGLYCERIN Stopped (09/17/18 1137)    Assessment: 9 yom presented to the ED with CP and SOB and on heparin for r/o ACS. Known history of afib but no anticoagulation due to non-compliance and EtOH abuse.  -heparin level at goal  Goal of Therapy:  Heparin level 0.3-0.7 units/ml Monitor platelets by anticoagulation protocol: Yes   Plan:  -No heparin changes  needed -Daily heparin level and CBC  Hildred Laser, PharmD Clinical Pharmacist **Pharmacist phone directory can now be found on [email or web address] ([email or web address]).  Listed under [place].

## 2018-09-17 NOTE — ED Provider Notes (Signed)
[facility] EMERGENCY DEPARTMENT Provider Note   CSN: [id number] Arrival date & time: 09/17/18  0557     History   Chief Complaint Chief Complaint  Patient presents with  . Chest Pain  . Shortness of Breath    HPI Kenneth [name] is a 62 y.o. male with history of cardiomyopathy, EtOH use, A. fib/flutter currently not anticoagulated due to poor compliance and EtOH use, ESRD on hemodialysis Tuesday Thursday Saturday, and hypertension presents for evaluation of acute onset, intermittent chest pains since last night.  He states that symptoms began yesterday at around 10 PM while getting ready for bed.  He states that pain was left-sided sharp and pressure-like radiating to the left upper extremity.  No aggravating or alleviating factors noted.  He notes associated shortness of breath nausea, and diaphoresis. Denies vomiting, abdominal pain, or lightheadedness.  He took 1 tablet of sublingual nitroglycerin with improvement in his symptoms and was able to go to sleep.  He woke up again at around 5 AM with recurrent symptoms and called EMS.  They gave 1 sublingual nitroglycerin and 304 mg of aspirin.  He states he has no chest pain at this time.  He states that he has similar pains "all the time and normally I could fight it off but I just couldn't today".   Recent admission for chest pain on 09/01/2018 discharged 09/07/2018 with no surgical intervention planned.  He has severe obstructive coronary artery disease. Last received dialysis yesterday, full treatment. He is a current smoker of approximately 2 packs of cigarettes daily. Denies recreational drug use, states last alcoholic beverage was 2 days ago (1 beer).   The history is provided by the patient.    Past Medical History:  Diagnosis Date  . Acute CHF ([place]) 01/2018  . Cardiomyopathy secondary    likely related to HTN heart disease; possibly ETOH related as well  . Chronic combined systolic and diastolic heart failure  (HCC)    Echocardiogram 09/22/11: Moderate LVH, EF 93-71%, grade 3 diastolic dysfunction, mild MR, moderate to severe LAE, mild RVE, mild to moderate TR, small to moderate pericardial effusion  . ESRD (end stage renal disease) on dialysis ([place])    due to hypertensive nephrosclerosis; TTS; [address] (09/01/2018)  . History of alcohol abuse   . Hypertension     Patient Active Problem List   Diagnosis Date Noted  . Unstable angina ([place])   . ACS (acute coronary syndrome) ([place]) 09/01/2018  . ESRD (end stage renal disease) ([place]) 07/20/2018  . Acute respiratory failure ([place]) 05/11/2018  . Cardiac arrest ([place]) 03/09/2018  . Acute encephalopathy   . Acute on chronic respiratory failure ([place]) 02/16/2018  . Tobacco dependence 02/16/2018  . Pulmonary edema 01/26/2018  . Acute respiratory failure with hypoxia ([place]) 01/26/2018  . Atrial fibrillation and flutter ([place])   . Shortness of breath   . Acute on chronic diastolic CHF (congestive heart failure) ([place])   . Hypothermia 10/08/2016  . ESRD on hemodialysis ([place]) 10/08/2016  . Fall 10/08/2016  . Syncope 10/07/2016  . Near syncope 07/07/2016  . Cardiomyopathy ([place]) 07/07/2016  . Ventricular tachycardia, non-sustained ([place]) 07/06/2016  . Hypoglycemia 07/06/2016  . Alcoholism ([place]) 05/13/2016  . Hypertensive emergency 12/08/2013  . Cardiomyopathy secondary 10/09/2011  . Essential hypertension 10/09/2011  . Alcohol withdrawal delirium ([place]) 09/25/2011  . Chronic diastolic CHF (congestive heart failure) ([place]) 09/24/2011    Past Surgical History:  Procedure Laterality Date  . AV FISTULA PLACEMENT Left 05/14/2016  Procedure: LEFT ARM BASILIC VEIN TRANSPOSITION;  Surgeon: Rosetta Posner, MD;  Location: [facility];  Service: Vascular;  Laterality: Left;  . LEFT HEART CATH AND CORONARY ANGIOGRAPHY N/A 09/02/2018   Procedure: LEFT HEART CATH AND CORONARY ANGIOGRAPHY;  Surgeon: Nelva Bush, MD;  Location: [facility];  Service: Cardiovascular;   Laterality: N/A;  . PERIPHERAL VASCULAR CATHETERIZATION N/A 05/13/2016   Procedure: Dialysis/Perma Catheter Insertion;  Surgeon: Serafina Mitchell, MD;  Location: [facility];  Service: Cardiovascular;  Laterality: N/A;        Home Medications    Prior to Admission medications   Medication Sig Start Date End Date Taking? Authorizing Provider  nitroGLYCERIN (NITROSTAT) 0.4 MG SL tablet Place 1 tablet (0.4 mg total) under the tongue every 5 (five) minutes as needed for chest pain. NEED OV. 09/15/18  Yes [name], [name], MD  aspirin EC 81 MG EC tablet Take 1 tablet (81 mg total) by mouth daily. Patient not taking: Reported on 09/17/2018 09/08/18   Kayleen Memos, DO  atorvastatin (LIPITOR) 80 MG tablet Take 1 tablet (80 mg total) by mouth daily at 6 PM. Patient not taking: Reported on 09/17/2018 09/07/18   Kayleen Memos, DO  calcitRIOL (ROCALTROL) 0.25 MCG capsule Take 3 capsules (0.75 mcg total) by mouth Every Tuesday,Thursday,and Saturday with dialysis. Patient not taking: Reported on 09/17/2018 09/09/18   Kayleen Memos, DO  carvedilol (COREG) 12.5 MG tablet Take 1 tablet (12.5 mg total) by mouth 2 (two) times daily with a meal. Patient not taking: Reported on 09/17/2018 09/07/18   Kayleen Memos, DO  cinacalcet (SENSIPAR) 30 MG tablet Take 2 tablets (60 mg total) by mouth Every Tuesday,Thursday,and Saturday with dialysis. Patient not taking: Reported on 09/17/2018 09/07/18   Kayleen Memos, DO  clopidogrel (PLAVIX) 75 MG tablet Take 1 tablet (75 mg total) by mouth daily. Patient not taking: Reported on 09/17/2018 09/08/18   Kayleen Memos, DO  divalproex (DEPAKOTE) 500 MG DR tablet Take 1 tablet (500 mg total) by mouth 2 (two) times daily. Patient not taking: Reported on 09/17/2018 03/27/18 08/11/26  Dessa Phi, DO  folic acid (FOLVITE) 1 MG tablet Take 1 tablet (1 mg total) by mouth daily. Patient not taking: Reported on 05/11/2018 01/28/18   Cristal Ford, DO  isosorbide  mononitrate (IMDUR) 60 MG 24 hr tablet Take 1 tablet (60 mg total) by mouth daily. Patient not taking: Reported on 09/17/2018 09/08/18   Kayleen Memos, DO  lanthanum (FOSRENOL) 1000 MG chewable tablet Chew 2 tablets (2,000 mg total) by mouth 3 (three) times daily with meals. Patient not taking: Reported on 09/17/2018 09/07/18   Kayleen Memos, DO  Multiple Vitamin (MULTIVITAMIN WITH MINERALS) TABS tablet Take 1 tablet by mouth daily. Patient not taking: Reported on 05/11/2018 01/28/18   Cristal Ford, DO  thiamine 100 MG tablet Take 1 tablet (100 mg total) by mouth daily. Patient not taking: Reported on 05/11/2018 01/28/18   Cristal Ford, DO    Family History Family History  Problem Relation Age of Onset  . Emphysema Mother   . Cirrhosis Father     Social History Social History   Tobacco Use  . Smoking status: Former Smoker    Packs/day: 2.00    Years: 48.00    Pack years: 96.00    Types: Cigars    Last attempt to quit: 10/27/2017    Years since quitting: 0.8  . Smokeless tobacco: Former Systems developer    Types:  Chew  Substance Use Topics  . Alcohol use: Yes    Frequency: Never    Comment: 09/01/2018 "nothing last 5 months"  . Drug use: No     Allergies   Patient has no known allergies.   Review of Systems Review of Systems  Constitutional: Positive for diaphoresis.  Respiratory: Positive for shortness of breath.   Cardiovascular: Positive for chest pain. Negative for leg swelling.  Gastrointestinal: Positive for nausea. Negative for abdominal pain and vomiting.  All other systems reviewed and are negative.    Physical Exam Updated Vital Signs BP 125/79   Pulse 77   Temp 97.7 F (36.5 C) (Oral)   Resp 18   Ht 5\' 11"  (1.803 m)   Wt 86.2 kg   SpO2 97%   BMI 26.50 kg/m   Physical Exam  Constitutional: He appears well-developed and well-nourished. No distress.  HENT:  Head: Normocephalic and atraumatic.  Eyes: Conjunctivae are normal. Right eye exhibits no  discharge. Left eye exhibits no discharge.  Neck: No JVD present. No tracheal deviation present.  Cardiovascular: Normal rate and regular rhythm.  Pulses:      Radial pulses are 2+ on the right side, and 1+ on the left side.       Dorsalis pedis pulses are 2+ on the right side, and 2+ on the left side.       Posterior tibial pulses are 2+ on the right side, and 2+ on the left side.  AV fistula to the left upper extremity with palpable thrill, no lower extremity edema  Pulmonary/Chest: Effort normal and breath sounds normal. He exhibits no tenderness.  Abdominal: Soft. Bowel sounds are normal. He exhibits no distension. There is no tenderness.  Musculoskeletal:       Right lower leg: Normal. He exhibits no tenderness and no edema.       Left lower leg: Normal. He exhibits no tenderness and no edema.  Neurological: He is alert.  Skin: Skin is dry. No erythema.  Psychiatric: He has a normal mood and affect. His behavior is normal.  Nursing note and vitals reviewed.    ED Treatments / Results  Labs (all labs ordered are listed, but only abnormal results are displayed) Labs Reviewed  BASIC METABOLIC PANEL - Abnormal; Notable for the following components:      Result Value   Chloride 94 (*)    Glucose, Bld 109 (*)    BUN 34 (*)    Creatinine, Ser 8.07 (*)    Calcium 7.7 (*)    GFR calc non Af Amer 6 (*)    GFR calc Af Amer 7 (*)    All other components within normal limits  CBC - Abnormal; Notable for the following components:   RBC 3.33 (*)    Hemoglobin 11.0 (*)    HCT 33.6 (*)    MCV 100.9 (*)    All other components within normal limits  APTT - Abnormal; Notable for the following components:   aPTT 37 (*)    All other components within normal limits  LIPID PANEL - Abnormal; Notable for the following components:   Cholesterol 204 (*)    Triglycerides 185 (*)    LDL Cholesterol 120 (*)    All other components within normal limits  PROTIME-INR  HEPARIN LEVEL  (UNFRACTIONATED)  I-STAT TROPONIN, ED    EKG EKG Interpretation  Date/Time:  Friday September 17 2018 06:59:10 EST Ventricular Rate:  101 PR Interval:    QRS Duration: 102  QT Interval:  376 QTC Calculation: 488 R Axis:   -55 Text Interpretation:  Sinus tachycardia Left anterior fascicular block Anteroseptal infarct, old Repol abnrm, severe global ischemia (LM/MVD) AVR, v1 with elevation, ST depressions inferiorly and laterally Confirmed by Lennice Sites [phone]) on 09/17/2018 7:06:49 AM Also confirmed by Lennice Sites [phone]), editor Hattie Perch ([id number])  on 09/17/2018 7:50:18 AM   Radiology Dg Chest 2 View  Result Date: 09/17/2018 CLINICAL DATA:  Chest pain since last night.  Shortness of breath. EXAM: CHEST - 2 VIEW COMPARISON:  09/01/2018 FINDINGS: The heart size and mediastinal contours are within normal limits. Both lungs are clear. The visualized skeletal structures are unremarkable. IMPRESSION: No active cardiopulmonary disease. Electronically Signed   By: Lucienne Capers M.D.   On: 09/17/2018 06:36   Ct Chest Without Contrast  Result Date: 09/17/2018 CLINICAL DATA:  Left chest pain radiating to both arms. History of coronary artery disease. Assessment of the degree of aortic calcification. History of end-stage renal disease on dialysis. EXAM: CT CHEST WITHOUT CONTRAST TECHNIQUE: Multidetector CT imaging of the chest was performed following the standard protocol without IV contrast. COMPARISON:  Chest radiographs 09/17/2018 FINDINGS: Cardiovascular: There is diffuse calcific atherosclerosis throughout the thoracic aorta without aneurysm. Extensive coronary artery calcification is present including left main and three-vessel disease. The heart is normal in size. There is no pericardial effusion. Mediastinum/Nodes: No enlarged axillary, mediastinal, or hilar lymph nodes identified within limitations of noncontrast technique. Small calcified lymph nodes in the mediastinum and  left hilum compatible with prior granulomatous disease. Unremarkable esophagus and thyroid. Lungs/Pleura: No pleural effusion or pneumothorax. Moderate centrilobular emphysema. Small calcified lung nodules bilaterally. Minimal dependent atelectasis in the right lower lobe. Upper Abdomen: Extensive atherosclerosis of the upper abdominal aorta and branch vessels including renal arteries. Severe bilateral renal atrophy with small low-density renal lesions bilaterally, likely cysts but incompletely evaluated. Musculoskeletal: No acute osseous abnormality or suspicious osseous lesion. IMPRESSION: 1. No acute abnormality identified in the chest. 2. Extensive aortic and coronary artery atherosclerosis. 3. Prior granulomatous disease. Aortic Atherosclerosis (ICD10-I70.0) and Emphysema (ICD10-J43.9). Electronically Signed   By: Logan Bores M.D.   On: 09/17/2018 09:39    Procedures Procedures (including critical care time)  Medications Ordered in ED Medications  0.9 %  sodium chloride infusion ( Intravenous New Bag/Given 09/17/18 0733)  nitroGLYCERIN 50 mg in dextrose 5 % 250 mL (0.2 mg/mL) infusion (10 mcg/min Intravenous Rate/Dose Change 09/17/18 0742)  heparin ADULT infusion 100 units/mL (25000 units/231mL sodium chloride 0.45%) (1,200 Units/hr Intravenous New Bag/Given 09/17/18 0732)  ondansetron (ZOFRAN) injection 4 mg (4 mg Intravenous Given 09/17/18 0713)  heparin bolus via infusion 4,000 Units ( Intravenous Canceled Entry 09/17/18 5956)     Initial Impression / Assessment and Plan / ED Course  I have reviewed the triage vital signs and the nursing notes.  Pertinent labs & imaging results that were available during my care of the patient were reviewed by me and considered in my medical decision making (see chart for details).    Patient presenting for evaluation of chest pain intermittently since last night.  Recent admission for the same.  He is medically complex with multiple comorbidities,  history of alcohol dependence, and extensive CAD on last cath earlier this month.  He is currently chest pain-free.  Initial EKG appears unchanged.  Initial troponin is negative.  Remainder of lab work appears to be generally at the patient's baseline with no metabolic derangements, chronically elevated creatinine  6:44 AM Spoke with  Dr. Harrington Challenger with Cardiology who will assess patient in the ED and provide further recommendations. May benefit from addition of Ranexa to his medication regimen. He is a poor candidate for surgical intervention due to his many comorbidities and history of EToH abuse.  However, appreciate their input given his complexity.  7:08 AM Patient with active 10/10 left-sided sharp chest pains.  Repeat EKG shows diffuse ischemic changes (elevations in aVR and V1 with diffuse depressions).  Patient diaphoretic, appears uncomfortable, actively vomiting.  Code STEMI activated, will give heparin.   7:16 AM Dr. Leonides Schanz has spoken with Dr. [name] with cardiology who recommends nitroglycerin drip. Cardiology to see patient in the ED. Patient is full code.   8:27 AM Cardiology recommends hospitalist admission.  Spoke with Dr. Earnest Conroy with [facility] hospitalist service who recommends consultation of the critical care team due to complex disease and high likelihood of cardiac arrest.  8:54 AM Spoke with Dr. Lynetta Mare with critical care who will see and assess the patient in the ED.  Final Clinical Impressions(s) / ED Diagnoses   Final diagnoses:  ST elevation myocardial infarction involving left main coronary artery [facility])    ED Discharge Orders    None       Renita Papa, PA-C 09/17/18 1005    Ward, [employer], DO 09/17/18 2358

## 2018-09-17 NOTE — Consult Note (Addendum)
Cardiology Consultation:   Patient ID: Kenneth [name] MRN: [id number]; DOB: 01/01/1956  Admit date: 09/17/2018 Date of Consult: 09/17/2018  Primary Care Provider: [name], MD Primary Cardiologist: Peter [name], MD   Patient Profile:   Kenneth [name] is a 62 y.o. male with prior EF of 25-30%, felt to be NICM given low risk Myoview in 2017, EF has since normalized on subsequent echos, chronic diastolic HF, h/o heavy ETHO use, NSVT, atrial fibrillation/flutter previously felt to be a poor candidate for anticoagulation due to poor compliance and ETHO use, ESRD on HD (Tu,Th,Sa), HTN and recent findings of 3 vessel CAD (Felt on consult with Dr. Prescott Gum not to be a surgical candidate) presented with Chest pain.   His EF of 25-30% was noted in 2017. He had a repeat echo 12/02/17 that showed normalization of LVEF at 65-70%. In May of this year, he presented to the hospital with acute hypoxic respiratory failure due to pulmonary edema from noncompliance w/ HD. While in the ED, he suffered a PEA arrest. Per hospital notes, he received empiric treatment for hyperkalemia, shock x1 for WCT, and amiodarone. ROSC after 10 mins of ACLS followed by targeted temperature management. Following this he continued to be encephalopathic and had AFib with RVR, but ultimately reverted to NSR. He was not seen by cardiology that particular hospitalization.  History of Present Illness:   Kenneth [name] was recently admitted for chest pain. Underwent cath 09/02/18 found to have severe three vessel disease with LM involvement. Not thought to be a surgical candidate. The patient is very high risk for PCI.  This risk might be prohibitive.  Stent thrombosis, were he not to be compliant with meds would likely be fatal. Treated with heparin.   Since discharge, he has not had taken any medication. Continued to drink. Says last had one beer on  Wednesdays. Compliant with dialysis. Last night, around 10pm, he had sudden  onset chest pressure associated with SOB. Went to sleep initially but woke up with severe pain radiating to his left arm with associated SOB and nausea. EMS gave him 1 nitro and ASA 324mg . Pain minimally improved. In ER, EKG showed sinus rhythm with severe ST depression in inferior lateral leads - new, personally reviewed. POC troponin 0.02. Hgb 11. Scr 8. Initially called code STEMI but subsequently cancelled. IV nitro and IV heparin started by ER provider.   Past Medical History:  Diagnosis Date  . Acute CHF ([place]) 01/2018  . Cardiomyopathy secondary    likely related to HTN heart disease; possibly ETOH related as well  . Chronic combined systolic and diastolic heart failure (HCC)    Echocardiogram 09/22/11: Moderate LVH, EF 42-68%, grade 3 diastolic dysfunction, mild MR, moderate to severe LAE, mild RVE, mild to moderate TR, small to moderate pericardial effusion  . ESRD (end stage renal disease) on dialysis ([place])    due to hypertensive nephrosclerosis; TTS; [facility] (09/01/2018)  . History of alcohol abuse   . Hypertension     Past Surgical History:  Procedure Laterality Date  . AV FISTULA PLACEMENT Left 05/14/2016   Procedure: LEFT ARM BASILIC VEIN TRANSPOSITION;  Surgeon: Rosetta Posner, MD;  Location: [facility];  Service: Vascular;  Laterality: Left;  . LEFT HEART CATH AND CORONARY ANGIOGRAPHY N/A 09/02/2018   Procedure: LEFT HEART CATH AND CORONARY ANGIOGRAPHY;  Surgeon: Nelva Bush, MD;  Location: [facility];  Service: Cardiovascular;  Laterality: N/A;  . PERIPHERAL VASCULAR CATHETERIZATION N/A 05/13/2016  Procedure: Dialysis/Perma Catheter Insertion;  Surgeon: Serafina Mitchell, MD;  Location: [facility];  Service: Cardiovascular;  Laterality: N/A;     Inpatient Medications: Scheduled Meds:  Continuous Infusions: . sodium chloride 10 mL/hr at 09/17/18 0733  . heparin 1,200 Units/hr (09/17/18 0732)  . nitroGLYCERIN 10 mcg/min (09/17/18 0742)   PRN  Meds:   Allergies:   No Known Allergies  Social History:   Social History   Socioeconomic History  . Marital status: Divorced    Spouse name: Not on file  . Number of children: Not on file  . Years of education: Not on file  . Highest education level: Not on file  Occupational History  . Not on file  Social Needs  . Financial resource strain: Not on file  . Food insecurity:    Worry: Not on file    Inability: Not on file  . Transportation needs:    Medical: Not on file    Non-medical: Not on file  Tobacco Use  . Smoking status: Former Smoker    Packs/day: 2.00    Years: 48.00    Pack years: 96.00    Types: Cigars    Last attempt to quit: 10/27/2017    Years since quitting: 0.8  . Smokeless tobacco: Former Systems developer    Types: Chew  Substance and Sexual Activity  . Alcohol use: Yes    Frequency: Never    Comment: 09/01/2018 "nothing last 5 months"  . Drug use: No  . Sexual activity: Not Currently  Lifestyle  . Physical activity:    Days per week: Not on file    Minutes per session: Not on file  . Stress: Not on file  Relationships  . Social connections:    Talks on phone: Not on file    Gets together: Not on file    Attends religious service: Not on file    Active member of club or organization: Not on file    Attends meetings of clubs or organizations: Not on file    Relationship status: Not on file  . Intimate partner violence:    Fear of current or ex partner: Not on file    Emotionally abused: Not on file    Physically abused: Not on file    Forced sexual activity: Not on file  Other Topics Concern  . Not on file  Social History Narrative  . Not on file    Family History:    Family History  Problem Relation Age of Onset  . Emphysema Mother   . Cirrhosis Father      ROS:  Please see the history of present illness.  All other ROS reviewed and negative.     Physical Exam/Data:   Vitals:   09/17/18 0607 09/17/18 0608 09/17/18 0738 09/17/18 0745   BP: 125/68  135/72 127/74  Pulse: 79  72 88  Resp: 18  18 19   Temp: 97.7 F (36.5 C)     TempSrc: Oral     SpO2: 97%  98% 95%  Weight:  86.2 kg    Height:  5\' 11"  (1.803 m)     No intake or output data in the 24 hours ending 09/17/18 0753 Filed Weights   09/17/18 0608  Weight: 86.2 kg   Body mass index is 26.5 kg/m.  General:  Well nourished, well developed, in no acute distress HEENT: normal Lymph: no adenopathy Neck: no JVD Endocrine:  No thryomegaly Vascular: No carotid bruits; FA pulses 2+ bilaterally  without bruits  Cardiac:  normal S1, S2; RRR; no murmur  Lungs:  clear to auscultation bilaterally, no wheezing, rhonchi or rales  Abd: soft, nontender, no hepatomegaly  Ext: no edema Musculoskeletal:  No deformities, BUE and BLE strength normal and equal Skin: warm and dry  Neuro:  CNs 2-12 intact, no focal abnormalities noted Psych:  Normal affect   EKG:  The EKG was personally reviewed and demonstrates:  As above  Relevant CV Studies:  LEFT HEART CATH AND CORONARY ANGIOGRAPHY   09/02/18  Conclusion   Conclusions: 1. Severe multivessel coronary artery disease involving the distal LMCA, ostial/proximal LAD, ostial and proximal LCx, and mid RCA. 2. Normal left ventricular filling pressure. 3. Scattered atherosclerotic calcification of the ascending aorta.  Recommendations: 1. Cardiac surgery consultation for CABG.  CT chest may be helpful to further assess degree of aortic calcification. 2. Initiate heparin infusion 8 hours after sheath removal. 3. Aggressive secondary prevention, including indefinite aspirin 81 mg daily and high-intensity statin therapy. 4. Plan for hemodialysis tomorrow.  Nelva Bush, MD [facility] Pager: [phone]   Diagnostic Diagram      Echocardiogram 09/01/18 Study Conclusions  - Left ventricle: The cavity size was normal. Systolic function was   normal. The estimated ejection fraction was in the range of 55%   to  60%. Wall motion was normal; there were no regional wall   motion abnormalities. Doppler parameters are consistent with   abnormal left ventricular relaxation (grade 1 diastolic   dysfunction). - Mitral valve: There was mild regurgitation. - Left atrium: The atrium was mildly dilated. - Atrial septum: There was a probable, small patent foramen ovale.  Laboratory Data:  Chemistry Recent Labs  Lab 09/17/18 0604  NA 135  K 4.4  CL 94*  CO2 28  GLUCOSE 109*  BUN 34*  CREATININE 8.07*  CALCIUM 7.7*  GFRNONAA 6*  GFRAA 7*  ANIONGAP 13    Hematology Recent Labs  Lab 09/17/18 0604  WBC 9.4  RBC 3.33*  HGB 11.0*  HCT 33.6*  MCV 100.9*  MCH 33.0  MCHC 32.7  RDW 15.2  PLT 271    Recent Labs  Lab 09/17/18 0615  TROPIPOC 0.02   Radiology/Studies:  Dg Chest 2 View  Result Date: 09/17/2018 CLINICAL DATA:  Chest pain since last night.  Shortness of breath. EXAM: CHEST - 2 VIEW COMPARISON:  09/01/2018 FINDINGS: The heart size and mediastinal contours are within normal limits. Both lungs are clear. The visualized skeletal structures are unremarkable. IMPRESSION: No active cardiopulmonary disease. Electronically Signed   By: Lucienne Capers M.D.   On: 09/17/2018 06:36   Assessment and Plan:   1. Chest pain with recent finding of severe 3 V disease - Recent cath 09/02/18 as above. Turned down for CABG. PCI felt very high risk. Also non compliant. He says "I am not taking any medications". EKG with new inferior lateral ST depression. Poc Troponin 0.02. Continue IV heparin and IV nitro. Pain improving. Cycle troponin. No plan for cath currently. He needs to saw compliance prior to any intervention.   2. Alcohol abuse - CIWA protocol per pharmacy   3. ESRD on HD - on T, Th, Sat.    Needs social worker consult to arrange transportation and helps get his meds. He wants to be a full code.   For questions or updates, please contact [place] Please consult [email or web address]  for contact info under   Jarrett Soho, PA  09/17/2018 7:53 AM  PT seen and examined  I agree with findings of B Bhagat above   Pt is a 63 yo with severe CAD as noted   Also ESRD Presents with CP    He is noncompliant   Has not taken meds since d/c   First couldn't afford   Then just didn't get  Currently pain free  ON exam, comfortable laying flat Lungs are CTA Cardiac RRR   No murmurs   No S4 Abd benign Ex are without edema  EKG with diffuse ST depression   I have reviewe with pt and also with interventional service.    He is not a candidate for surgery    Do not reocmm cath with intervention as with noncompliance risk is too great for stent closure  Would work with medical Rx    Needs social work to help with home situation.  Will follow.  Dorris Carnes

## 2018-09-17 NOTE — Progress Notes (Signed)
Palliative Medicine consult noted. Due to high referral volume, there may be a delay seeing this patient. Please call the Palliative Medicine Team office at [phone] if recommendations are needed in the interim.  Thank you for inviting [place] to see this patient.  Marjie Skiff Kymber Kosar, RN, BSN, [facility], [place] Palliative Medicine Team 09/17/2018 1:48 PM Office [phone]

## 2018-09-17 NOTE — ED Notes (Signed)
IV team at bedside 

## 2018-09-17 NOTE — Telephone Encounter (Signed)
[name] with [facility] left a detailed message on approved verbal orders on pt PT/OT and Social work.

## 2018-09-17 NOTE — Consult Note (Signed)
Reason for Consult: To manage dialysis and dialysis related needs Referring Physician: Dr. Domingo Cocking Kenneth [name] is an 62 y.o. male.  HPI: Pt is a 49M with ESRD on HD TTS at [facility], severe CAD, cardiomyopathy with EF previously 25-30%, now 55-60%, EtOH use, and recent admission for unstable angina (discharged 11/12) who is now seen in consultation at the request of Dr. Lynetta Mare for management of ESRD and provision of HD.  Pt presented to ED this AM with CP that began at 10 pm last night.  Presented to ED and was found to have EKG with elevation in V1 and aVR with reciprocal depressions in anterolateral leads.  Initially code STEMI was called; however was subsequently cancelled due to pt's h/o noncompliance.  During pt's recent admission for unstable angina he was found not to be a candidate for CABG or PCI due to concerns re: taking appropriate medications.    He is being medically managed for STEMI at present.    He is currently on nitro and heparin gtts.  He denies CP at present.  Last HD session was yesterday and he stayed full time.  No issues.  Left 2.4 kg over EDW.      Dialysis orders:  [facility] TTS 4 hr 15 min 2K/ 2.0 Ca EDW 88.5 kg F180  Heparin 2400 u bolus Sensipar 90 mg TIW  Venofer 50 mg q week Mircera 50 mg q 2 weeks (last given 11/14) Calcitriol 0.75 mcg TIW   Past Medical History:  Diagnosis Date  . Acute CHF ([place]) 01/2018  . Cardiomyopathy secondary    likely related to HTN heart disease; possibly ETOH related as well  . Chronic combined systolic and diastolic heart failure (HCC)    Echocardiogram 09/22/11: Moderate LVH, EF 69-62%, grade 3 diastolic dysfunction, mild MR, moderate to severe LAE, mild RVE, mild to moderate TR, small to moderate pericardial effusion  . ESRD (end stage renal disease) on dialysis ([place])    due to hypertensive nephrosclerosis; TTS; [facility] (09/01/2018)  . History of alcohol abuse   . Hypertension     Past Surgical History:  Procedure  Laterality Date  . AV FISTULA PLACEMENT Left 05/14/2016   Procedure: LEFT ARM BASILIC VEIN TRANSPOSITION;  Surgeon: Rosetta Posner, MD;  Location: [facility];  Service: Vascular;  Laterality: Left;  . LEFT HEART CATH AND CORONARY ANGIOGRAPHY N/A 09/02/2018   Procedure: LEFT HEART CATH AND CORONARY ANGIOGRAPHY;  Surgeon: Nelva Bush, MD;  Location: [facility];  Service: Cardiovascular;  Laterality: N/A;  . PERIPHERAL VASCULAR CATHETERIZATION N/A 05/13/2016   Procedure: Dialysis/Perma Catheter Insertion;  Surgeon: Serafina Mitchell, MD;  Location: [facility];  Service: Cardiovascular;  Laterality: N/A;    Family History  Problem Relation Age of Onset  . Emphysema Mother   . Cirrhosis Father     Social History:  reports that he quit smoking about 10 months ago. His smoking use included cigars. He has a 96.00 pack-year smoking history. He has quit using smokeless tobacco.  His smokeless tobacco use included chew. He reports that he drinks alcohol. He reports that he does not use drugs.  Allergies: No Known Allergies  Medications:  Scheduled: . aspirin  324 mg Oral NOW   Or  . aspirin  300 mg Rectal NOW  . [START ON 09/18/2018] aspirin EC  81 mg Oral Daily  . atorvastatin  80 mg Oral q1800  . [START ON 09/18/2018] calcitRIOL  0.75 mcg Oral Q T,Th,Sa-HD  . [  START ON 09/18/2018] cinacalcet  60 mg Oral Q T,Th,Sa-HD  . [START ON 09/18/2018] clopidogrel  75 mg Oral Q breakfast  . divalproex  500 mg Oral BID  . folic acid  1 mg Oral Daily  . isosorbide mononitrate  60 mg Oral Daily  . lanthanum  2,000 mg Oral TID WC  . metoprolol succinate  25 mg Oral Daily  . multivitamin with minerals  1 tablet Oral Daily  . thiamine  100 mg Oral Daily     Results for orders placed or performed during the hospital encounter of 09/17/18 (from the past 48 hour(s))  Basic metabolic panel     Status: Abnormal   Collection Time: 09/17/18  6:04 AM  Result Value Ref Range   Sodium 135 135 - 145  mmol/L   Potassium 4.4 3.5 - 5.1 mmol/L   Chloride 94 (L) 98 - 111 mmol/L   CO2 28 22 - 32 mmol/L   Glucose, Bld 109 (H) 70 - 99 mg/dL   BUN 34 (H) 8 - 23 mg/dL   Creatinine, Ser 8.07 (H) 0.61 - 1.24 mg/dL   Calcium 7.7 (L) 8.9 - 10.3 mg/dL   GFR calc non Af Amer 6 (L) >60 mL/min   GFR calc Af Amer 7 (L) >60 mL/min    Comment: (NOTE) The eGFR has been calculated using the CKD EPI equation. This calculation has not been validated in all clinical situations. eGFR's persistently <60 mL/min signify possible Chronic Kidney Disease.    Anion gap 13 5 - 15    Comment: Performed at [facility] [address] [place], [place] [address]  CBC     Status: Abnormal   Collection Time: 09/17/18  6:04 AM  Result Value Ref Range   WBC 9.4 4.0 - 10.5 K/uL   RBC 3.33 (L) 4.22 - 5.81 MIL/uL   Hemoglobin 11.0 (L) 13.0 - 17.0 g/dL   HCT 33.6 (L) 39.0 - 52.0 %   MCV 100.9 (H) 80.0 - 100.0 fL   MCH 33.0 26.0 - 34.0 pg   MCHC 32.7 30.0 - 36.0 g/dL   RDW 15.2 11.5 - 15.5 %   Platelets 271 150 - 400 K/uL   nRBC 0.0 0.0 - 0.2 %    Comment: Performed at [facility], [place] [address] [place], [place] [address]  Protime-INR     Status: None   Collection Time: 09/17/18  6:04 AM  Result Value Ref Range   Prothrombin Time 13.6 11.4 - 15.2 seconds   INR 1.05     Comment: Performed at [facility] [address] [place], [place] [address]  APTT     Status: Abnormal   Collection Time: 09/17/18  6:04 AM  Result Value Ref Range   aPTT 37 (H) 24 - 36 seconds    Comment:        IF BASELINE aPTT IS ELEVATED, SUGGEST PATIENT RISK ASSESSMENT BE USED TO DETERMINE APPROPRIATE ANTICOAGULANT THERAPY. Performed at [facility], [place] [address] [place], [place] [address]   Lipid panel     Status: Abnormal   Collection Time: 09/17/18  6:04 AM  Result Value Ref Range   Cholesterol 204 (H) 0 - 200 mg/dL   Triglycerides 185 (H) <150 mg/dL   HDL 47 >40 mg/dL   Total CHOL/HDL Ratio 4.3  RATIO   VLDL 37 0 - 40 mg/dL   LDL Cholesterol 120 (H) 0 - 99 mg/dL    Comment:  Total Cholesterol/HDL:CHD Risk Coronary Heart Disease Risk Table                     Men   Women  1/2 Average Risk   3.4   3.3  Average Risk       5.0   4.4  2 X Average Risk   9.6   7.1  3 X Average Risk  23.4   11.0        Use the calculated Patient Ratio above and the CHD Risk Table to determine the patient's CHD Risk.        ATP III CLASSIFICATION (LDL):  <100     mg/dL   Optimal  100-129  mg/dL   Near or Above                    Optimal  130-159  mg/dL   Borderline  160-189  mg/dL   High  >190     mg/dL   Very High Performed at [facility] [address] [place], [place] [address]   I-stat troponin, ED     Status: None   Collection Time: 09/17/18  6:15 AM  Result Value Ref Range   Troponin i, poc 0.02 0.00 - 0.08 ng/mL   Comment 3            Comment: Due to the release kinetics of cTnI, a negative result within the first hours of the onset of symptoms does not rule out myocardial infarction with certainty. If myocardial infarction is still suspected, repeat the test at appropriate intervals.   Troponin I - Now Then Q6H     Status: None   Collection Time: 09/17/18 10:54 AM  Result Value Ref Range   Troponin I <0.03 <0.03 ng/mL    Comment: Performed at [facility] [address] [place], [place] [address]    Dg Chest 2 View  Result Date: 09/17/2018 CLINICAL DATA:  Chest pain since last night.  Shortness of breath. EXAM: CHEST - 2 VIEW COMPARISON:  09/01/2018 FINDINGS: The heart size and mediastinal contours are within normal limits. Both lungs are clear. The visualized skeletal structures are unremarkable. IMPRESSION: No active cardiopulmonary disease. Electronically Signed   By: Lucienne Capers M.D.   On: 09/17/2018 06:36   Ct Chest Without Contrast  Result Date: 09/17/2018 CLINICAL DATA:  Left chest pain radiating to both arms. History of coronary artery  disease. Assessment of the degree of aortic calcification. History of end-stage renal disease on dialysis. EXAM: CT CHEST WITHOUT CONTRAST TECHNIQUE: Multidetector CT imaging of the chest was performed following the standard protocol without IV contrast. COMPARISON:  Chest radiographs 09/17/2018 FINDINGS: Cardiovascular: There is diffuse calcific atherosclerosis throughout the thoracic aorta without aneurysm. Extensive coronary artery calcification is present including left main and three-vessel disease. The heart is normal in size. There is no pericardial effusion. Mediastinum/Nodes: No enlarged axillary, mediastinal, or hilar lymph nodes identified within limitations of noncontrast technique. Small calcified lymph nodes in the mediastinum and left hilum compatible with prior granulomatous disease. Unremarkable esophagus and thyroid. Lungs/Pleura: No pleural effusion or pneumothorax. Moderate centrilobular emphysema. Small calcified lung nodules bilaterally. Minimal dependent atelectasis in the right lower lobe. Upper Abdomen: Extensive atherosclerosis of the upper abdominal aorta and branch vessels including renal arteries. Severe bilateral renal atrophy with small low-density renal lesions bilaterally, likely cysts but incompletely evaluated. Musculoskeletal: No acute osseous abnormality or suspicious osseous lesion. IMPRESSION: 1. No acute abnormality  identified in the chest. 2. Extensive aortic and coronary artery atherosclerosis. 3. Prior granulomatous disease. Aortic Atherosclerosis (ICD10-I70.0) and Emphysema (ICD10-J43.9). Electronically Signed   By: Logan Bores M.D.   On: 09/17/2018 09:39    ROS: all other systems reviewed and are negative except as per HPI Blood pressure 130/75, pulse 78, temperature 98 F (36.7 C), temperature source Oral, resp. rate (!) 8, height 5' 11"  (1.803 m), weight 89 kg, SpO2 98 %. .  GEN appears older than stated age 59 EOMI PERRL, muddy sclerae NECK no JVD PULM  normal WOB, clear bilaterally  CV regular today, soft systolic murmur ABD soft, mildly distended, NABS EXT no LE edema NEURO AAO x 3 ACCESS LUE AVF, aneurysmal, + T/B  Assessment/Plan: 1 STEMI and severe CAD: initially had code STEMI and was cancelled due to noncompliance.  Medical management for now.  Per cardiology.  Is CP free at present. 2 ESRD: TTS, next planned HD 11/23.  2K bath.   3 Hypertension: good BP now, no gross extra volume.  Home meds include Coreg 12.5 mg BID,  Imdur 60 mg daily (also listed on OP dialysis record are amlodipine 10 mg daily and lisinopril 2.5 mg daily) 4. Anemia of ESRD: hgb 11.2, not due for ESA yet 5. Metabolic Bone Disease: sensipar 90 TIW and calcitriol TIW.  Takes Fosrenol packet 2000 mg TID AC 6. Nutrition: renal diet, vitamins 7. Dispo: in ICU, pall care has been consulted  Yonael Tulloch 09/17/2018, 1:35 PM

## 2018-09-17 NOTE — H&P (Signed)
NAME:  Kenneth [name], MRN:  [id number], DOB:  Jul 12, 1956, LOS: 0 ADMISSION DATE:  09/17/2018, CONSULTATION DATE:  09/17/2018 REFERRING MD:  Gunnar Bulla - ED [facility], CHIEF COMPLAINT:  Chest pain   HPI/Course in hospital  62 year old man who presented with left sided chest pain. EKG showed ST elevation in V1 and aVR with diffuse reciprocal ST depression.  Code STEMI cancelled as patient has been previously been declined for both CABG and PCI due to medical non-compliance. He was started on medical therapy for ACS.  Given his high risk for decline, [facility] has refused to admit patient.  Patient reports 3 month history of typical angina described as left-sided, radiating to L arm with associated nausea diaphoresis. CCS class III provoked with walking 2 blocks or 1 flight of stairs or after eating. Lasts 30 mins.  Significant PMH includes CAD, ESRD secondary to HTN. Smoker, alcohol abuse. Prior CVA with cerebral atrophy and lacunar infarcts.   Consults: date of consult/date signed off & final recs:  Cardiology 11/22 Procedures (surgical and bedside):  None Subjective:  Patient states pain lasted 45 min today and was associated with dyspnea. Currently is pain free.  He reports being sober for past 6 months.  Objective   Blood pressure (!) 124/57, pulse 74, temperature 97.7 F (36.5 C), temperature source Oral, resp. rate 15, height 5\' 11"  (1.803 m), weight 86.2 kg, SpO2 97 %.       No intake or output data in the 24 hours ending 09/17/18 0933 Filed Weights   09/17/18 0608  Weight: 86.2 kg    Examination: General: thin man, no distress. HENT: no icterus, trachea midline. Lungs: clear, no hyperinflation. Cardiovascular: JVP not elevated, apex normal, S1S2 normal with no gallops or murmurs. No edema and PP++ Abdomen: soft and non-tender. Extremities: LUE fistula with strong thrill Neuro: Alert, no focal deficits. GU: Normal genitalia, no Foley.   Resolved Hospital Problem list     Assessment & Plan:  STEACS with diffuse EKG changes consistent with LM occlusion. Currently hemodynamically stable [place] 1 and chest pain free. Remains at high risk for cardiac decompensation. Know severe 3 vessel coronary disease, optimally managed surgically, but refused initially due to high operative risk and poor compliance.  ESRD on HD History of alcohol abuse  Plan  Admit ACS pathway. Medical management - wean NGT to off, start ISMN and betablocker Dual antiplatelet therapy. Complete 48h of iv heparin. Secondary prevention with statin, consider ACEi. IHD as per schedule - consult nephrology Monitor for alcohol withdrawal  Disposition / Summary of Today's Plan 09/17/18   ICU for now.  Patient remains full code as he has limited insight into his medical condition and I did not get a sense that he understood the implications of a change in code status.      Nutritional status and diet: fluid restricted, heart healthy. Pain/Anxiety/Delirium reduction: None required. VAP protocol (if indicated) N/A DVT prophylaxis: systemic anticoagulation. GI prophylaxis: not indicated.  Hyperglycemia protocol: phase 1 protocol* Mobility: progressive ambulation starting tomorrow. Antibiotic de-escalation: N/A Code Status: Full - will consult Palliative Care to assist in goals of care discussion. Family Communication: no family present  Labs and Ancillary Testing (personally reviewed)  CBC: HB 11 macrocytic  Chemistry: Creatinine 8.07, Tchol/HDL: 4.3  ABG N/A  Coagulation Profile: INR 1.05  Cardiac Enzymes: trop 0.02  CBG: 109  Microbiology: N/A  Echocardiogram 11/06 EF 55% with no regional WMA, G1DD. No significant valvular abnormalities at rest.  EKG:  Sinus tachycardia with ST elevation in aVR and V1 with diffuse deep downsloping ST depressions   CTchest: nil acute.  Review of Systems:   Negative apart from that mentioned in HPI Past medical history  He,  has a past  medical history of Acute CHF ([place]) (01/2018), Cardiomyopathy secondary, Chronic combined systolic and diastolic heart failure ([place]), ESRD (end stage renal disease) on dialysis [facility]), History of alcohol abuse, and Hypertension.   Surgical History    Past Surgical History:  Procedure Laterality Date  . AV FISTULA PLACEMENT Left 05/14/2016   Procedure: LEFT ARM BASILIC VEIN TRANSPOSITION;  Surgeon: Rosetta Posner, MD;  Location: [place];  Service: Vascular;  Laterality: Left;  . LEFT HEART CATH AND CORONARY ANGIOGRAPHY N/A 09/02/2018   Procedure: LEFT HEART CATH AND CORONARY ANGIOGRAPHY;  Surgeon: Nelva Bush, MD;  Location: [place] [facility];  Service: Cardiovascular;  Laterality: N/A;  . PERIPHERAL VASCULAR CATHETERIZATION N/A 05/13/2016   Procedure: Dialysis/Perma Catheter Insertion;  Surgeon: Serafina Mitchell, MD;  Location: [place] [facility];  Service: Cardiovascular;  Laterality: N/A;     Social History   Social History   Socioeconomic History  . Marital status: Divorced    Spouse name: Not on file  . Number of children: Not on file  . Years of education: Not on file  . Highest education level: Not on file  Occupational History  . Not on file  Social Needs  . Financial resource strain: Not on file  . Food insecurity:    Worry: Not on file    Inability: Not on file  . Transportation needs:    Medical: Not on file    Non-medical: Not on file  Tobacco Use  . Smoking status: Former Smoker    Packs/day: 2.00    Years: 48.00    Pack years: 96.00    Types: Cigars    Last attempt to quit: 10/27/2017    Years since quitting: 0.8  . Smokeless tobacco: Former Systems developer    Types: Chew  Substance and Sexual Activity  . Alcohol use: Yes    Frequency: Never    Comment: 09/01/2018 "nothing last 5 months"  . Drug use: No  . Sexual activity: Not Currently  Lifestyle  . Physical activity:    Days per week: Not on file    Minutes per session: Not on file  . Stress: Not on file   Relationships  . Social connections:    Talks on phone: Not on file    Gets together: Not on file    Attends religious service: Not on file    Active member of club or organization: Not on file    Attends meetings of clubs or organizations: Not on file    Relationship status: Not on file  . Intimate partner violence:    Fear of current or ex partner: Not on file    Emotionally abused: Not on file    Physically abused: Not on file    Forced sexual activity: Not on file  Other Topics Concern  . Not on file  Social History Narrative  . Not on file  ,  reports that he quit smoking about 10 months ago. His smoking use included cigars. He has a 96.00 pack-year smoking history. He has quit using smokeless tobacco.  His smokeless tobacco use included chew. He reports that he drinks alcohol. He reports that he does not use drugs.   Family history   His family history includes Cirrhosis  in his father; Emphysema in his mother.   Allergies No Known Allergies  Home meds  Prior to Admission medications   Medication Sig Start Date End Date Taking? Authorizing Provider  nitroGLYCERIN (NITROSTAT) 0.4 MG SL tablet Place 1 tablet (0.4 mg total) under the tongue every 5 (five) minutes as needed for chest pain. NEED OV. 09/15/18  Yes [name], [name], MD  aspirin EC 81 MG EC tablet Take 1 tablet (81 mg total) by mouth daily. Patient not taking: Reported on 09/17/2018 09/08/18   Kayleen Memos, DO  atorvastatin (LIPITOR) 80 MG tablet Take 1 tablet (80 mg total) by mouth daily at 6 PM. Patient not taking: Reported on 09/17/2018 09/07/18   Kayleen Memos, DO  calcitRIOL (ROCALTROL) 0.25 MCG capsule Take 3 capsules (0.75 mcg total) by mouth Every Tuesday,Thursday,and Saturday with dialysis. Patient not taking: Reported on 09/17/2018 09/09/18   Kayleen Memos, DO  carvedilol (COREG) 12.5 MG tablet Take 1 tablet (12.5 mg total) by mouth 2 (two) times daily with a meal. Patient not taking: Reported on  09/17/2018 09/07/18   Kayleen Memos, DO  cinacalcet (SENSIPAR) 30 MG tablet Take 2 tablets (60 mg total) by mouth Every Tuesday,Thursday,and Saturday with dialysis. Patient not taking: Reported on 09/17/2018 09/07/18   Kayleen Memos, DO  clopidogrel (PLAVIX) 75 MG tablet Take 1 tablet (75 mg total) by mouth daily. Patient not taking: Reported on 09/17/2018 09/08/18   Kayleen Memos, DO  divalproex (DEPAKOTE) 500 MG DR tablet Take 1 tablet (500 mg total) by mouth 2 (two) times daily. Patient not taking: Reported on 09/17/2018 03/27/18 08/11/26  Dessa Phi, DO  folic acid (FOLVITE) 1 MG tablet Take 1 tablet (1 mg total) by mouth daily. Patient not taking: Reported on 05/11/2018 01/28/18   Cristal Ford, DO  isosorbide mononitrate (IMDUR) 60 MG 24 hr tablet Take 1 tablet (60 mg total) by mouth daily. Patient not taking: Reported on 09/17/2018 09/08/18   Kayleen Memos, DO  lanthanum (FOSRENOL) 1000 MG chewable tablet Chew 2 tablets (2,000 mg total) by mouth 3 (three) times daily with meals. Patient not taking: Reported on 09/17/2018 09/07/18   Kayleen Memos, DO  Multiple Vitamin (MULTIVITAMIN WITH MINERALS) TABS tablet Take 1 tablet by mouth daily. Patient not taking: Reported on 05/11/2018 01/28/18   Cristal Ford, DO  thiamine 100 MG tablet Take 1 tablet (100 mg total) by mouth daily. Patient not taking: Reported on 05/11/2018 01/28/18   Cristal Ford, [place], [place] ICU Physician [place]  Pager: [phone] Mobile: [phone] After hours: [phone].

## 2018-09-17 NOTE — ED Notes (Signed)
intensivist at bedside.

## 2018-09-17 NOTE — ED Notes (Signed)
Per RN on 2H, room will not be ready for approx 30 mintues

## 2018-09-17 NOTE — ED Provider Notes (Addendum)
Medical screening examination/treatment/procedure(s) were conducted as a shared visit with non-physician practitioner(s) and myself.  I personally evaluated the patient during the encounter.  EKG Interpretation  Date/Time:  Friday September 17 2018 06:11:10 EST Ventricular Rate:  78 PR Interval:    QRS Duration: 100 QT Interval:  419 QTC Calculation: 478 R Axis:   -25 Text Interpretation:  Sinus rhythm Borderline left axis deviation Anteroseptal infarct, old Nonspecific T abnormalities, lateral leads No significant change since last tracing Confirmed by Pryor Curia [phone]) on 09/17/2018 6:14:32 AM  Patient is a 61 year old male with history of alcohol abuse, known severe coronary artery disease who presents to the emergency department chest pain that started at 10 PM last night.  Describes it as a pressure-like pain with shortness of breath, nausea, diaphoresis and dizziness.  Describes it as similar to his anginal equivalent.  Underwent cardiac catheterization on November 7 that showed severe multivessel coronary artery disease involving the distal LMCA, proximal LAD, proximal left circumflex and mid RCA.  Patient was seen by cardiothoracic surgery and deemed a poor operative candidate given his multiple comorbid medical problems.  Cardiothoracic surgery recommended possible PCI.  Cardiology revisited this option but again thought patient was at very high risk given his history of alcohol abuse and history of medical noncompliance.  Plan was for medical management.  Today EKG shows no ischemic changes and troponin is negative.  He is on Imdur to help with chest pain as well as aspirin and Plavix.  Will discuss with cardiology for their recommendations on managing this very difficult patient.    Tifini Reeder, Delice Bison, DO 09/17/18 9892    7:10 AM  Patient now having active chest pain.  Repeat EKG shows ST elevation in aVR and V1 with diffuse depression.  Code STEMI initiated.  Will start heparin.   Discussed with Dr. [name] with cardiology.  Patient still very poor candidate for PCI.  Recommend medical management and starting IV nitroglycerin.  I have discussed this with the patient that he is at high risk for death, arrhythmia given his condition today despite medical management and would still be very high risk with PCI or CABG.  He verbalized understanding.  He reports at this time he is a full code.  Cardiology to see in the emergency department emergently.    EKG Interpretation  Date/Time:  Friday September 17 2018 06:59:10 EST Ventricular Rate:  101 PR Interval:    QRS Duration: 102 QT Interval:  376 QTC Calculation: 488 R Axis:   -55 Text Interpretation:  Sinus tachycardia Left anterior fascicular block Anteroseptal infarct, old Repol abnrm, severe global ischemia (LM/MVD) AVR, v1 with elevation, ST depressions inferiorly and laterally Confirmed by Lennice Sites [phone]) on 09/17/2018 7:06:49 AM       CRITICAL CARE Performed by: Cyril Mourning Delcenia Inman   Total critical care time: 45 minutes  Critical care time was exclusive of separately billable procedures and treating other patients.  Critical care was necessary to treat or prevent imminent or life-threatening deterioration.  Critical care was time spent personally by me on the following activities: development of treatment plan with patient and/or surrogate as well as nursing, discussions with consultants, evaluation of patient's response to treatment, examination of patient, obtaining history from patient or surrogate, ordering and performing treatments and interventions, ordering and review of laboratory studies, ordering and review of radiographic studies, pulse oximetry and re-evaluation of patient's condition.     Pete Schnitzer, Delice Bison, DO 09/17/18 7408    Dyneshia Baccam, Delice Bison, DO  09/17/18 0718  

## 2018-09-18 ENCOUNTER — Inpatient Hospital Stay (HOSPITAL_COMMUNITY): Payer: Medicare Other

## 2018-09-18 DIAGNOSIS — I200 Unstable angina: Secondary | ICD-10-CM

## 2018-09-18 DIAGNOSIS — N185 Chronic kidney disease, stage 5: Secondary | ICD-10-CM

## 2018-09-18 DIAGNOSIS — R9431 Abnormal electrocardiogram [ECG] [EKG]: Secondary | ICD-10-CM

## 2018-09-18 DIAGNOSIS — I25119 Atherosclerotic heart disease of native coronary artery with unspecified angina pectoris: Secondary | ICD-10-CM

## 2018-09-18 DIAGNOSIS — I2510 Atherosclerotic heart disease of native coronary artery without angina pectoris: Secondary | ICD-10-CM

## 2018-09-18 LAB — BASIC METABOLIC PANEL
Anion gap: 17 — ABNORMAL HIGH (ref 5–15)
Anion gap: 12 (ref 5–15)
BUN: 51 mg/dL — AB (ref 8–23)
BUN: 21 mg/dL (ref 8–23)
CHLORIDE: 97 mmol/L — AB (ref 98–111)
CO2: 24 mmol/L (ref 22–32)
CO2: 25 mmol/L (ref 22–32)
Calcium: 7.7 mg/dL — ABNORMAL LOW (ref 8.9–10.3)
Calcium: 7.6 mg/dL — ABNORMAL LOW (ref 8.9–10.3)
Chloride: 92 mmol/L — ABNORMAL LOW (ref 98–111)
Creatinine, Ser: 10.71 mg/dL — ABNORMAL HIGH (ref 0.61–1.24)
Creatinine, Ser: 6.58 mg/dL — ABNORMAL HIGH (ref 0.61–1.24)
GFR calc Af Amer: 5 mL/min — ABNORMAL LOW (ref >60)
GFR calc Af Amer: 9 mL/min — ABNORMAL LOW (ref >60)
GFR, EST NON AFRICAN AMERICAN: 4 mL/min — AB (ref >60)
GFR, EST NON AFRICAN AMERICAN: 8 mL/min — AB (ref >60)
GLUCOSE: 77 mg/dL (ref 70–99)
GLUCOSE: 93 mg/dL (ref 70–99)
POTASSIUM: 5.2 mmol/L — AB (ref 3.5–5.1)
POTASSIUM: 4.4 mmol/L (ref 3.5–5.1)
Sodium: 133 mmol/L — ABNORMAL LOW (ref 135–145)
Sodium: 134 mmol/L — ABNORMAL LOW (ref 135–145)

## 2018-09-18 LAB — ECHOCARDIOGRAM COMPLETE
Height: 71 in
Weight: 3128.77 oz

## 2018-09-18 LAB — CBC
HCT: 30.6 % — ABNORMAL LOW (ref 39.0–52.0)
Hemoglobin: 10 g/dL — ABNORMAL LOW (ref 13.0–17.0)
MCH: 32.3 pg (ref 26.0–34.0)
MCHC: 32.7 g/dL (ref 30.0–36.0)
MCV: 98.7 fL (ref 80.0–100.0)
PLATELETS: 271 10*3/uL (ref 150–400)
RBC: 3.1 MIL/uL — AB (ref 4.22–5.81)
RDW: 14.8 % (ref 11.5–15.5)
WBC: 9.5 10*3/uL (ref 4.0–10.5)
nRBC: 0 % (ref 0.0–0.2)

## 2018-09-18 LAB — HEPARIN LEVEL (UNFRACTIONATED): Heparin Unfractionated: 0.27 IU/mL — ABNORMAL LOW (ref 0.30–0.70)

## 2018-09-18 MED ORDER — CALCITRIOL 0.25 MCG PO CAPS
ORAL_CAPSULE | ORAL | Status: AC
  Filled 2018-09-18: qty 1

## 2018-09-18 MED ORDER — CALCITRIOL 0.5 MCG PO CAPS
ORAL_CAPSULE | ORAL | Status: AC
  Filled 2018-09-18: qty 1

## 2018-09-18 NOTE — Progress Notes (Signed)
  Echocardiogram 2D Echocardiogram has been performed.  Kenneth [name] 09/18/2018, 11:39 AM

## 2018-09-18 NOTE — Progress Notes (Signed)
[facility] Progress Note  Subjective: on HD no c/o's  Vitals:   09/18/18 1100 09/18/18 1115 09/18/18 1130 09/18/18 1200  BP: 115/75 (!) 83/54 101/65 (!) 94/42  Pulse: 88 79 93 83  Resp: 17 16 16 20   Temp:   98.9 F (37.2 C)   TempSrc:   Oral   SpO2: 99% 98% 99% 98%  Weight:      Height:        Inpatient medications: . aspirin EC  81 mg Oral Daily  . atorvastatin  80 mg Oral q1800  . calcitRIOL  0.75 mcg Oral Q T,Th,Sa-HD  . Chlorhexidine Gluconate Cloth  6 each Topical Q0600  . cinacalcet  60 mg Oral Q T,Th,Sa-HD  . clopidogrel  75 mg Oral Q breakfast  . divalproex  500 mg Oral BID  . folic acid  1 mg Oral Daily  . isosorbide mononitrate  60 mg Oral Daily  . lanthanum  2,000 mg Oral TID WC  . metoprolol succinate  25 mg Oral Daily  . multivitamin with minerals  1 tablet Oral Daily  . thiamine  100 mg Oral Daily   . sodium chloride 10 mL/hr at 09/17/18 0736  . sodium chloride    . sodium chloride    . heparin 1,350 Units/hr (09/18/18 2956)  . nitroGLYCERIN Stopped (09/17/18 1137)   sodium chloride, sodium chloride, acetaminophen, alteplase, heparin, lidocaine (PF), lidocaine-prilocaine, nitroGLYCERIN, ondansetron (ZOFRAN) IV, pentafluoroprop-tetrafluoroeth  Iron/TIBC/Ferritin/ %Sat    Component Value Date/Time   IRON 49 03/21/2018 1404   TIBC 207 (L) 03/21/2018 1404   FERRITIN 1,273 (H) 03/21/2018 1404   IRONPCTSAT 24 03/21/2018 1404    Exam: GEN appears older than stated age 62 EOMI PERRL, muddy sclerae NECK no JVD PULM normal WOB, clear bilaterally  CV regular today, soft systolic murmur ABD soft, mildly distended, NABS EXT no LE edema NEURO AAO x 3 ACCESS LUE AVF, aneurysmal, + T/B  Dialysis: TTS [facility]  4h 50min   2/2 bath  88.5kg  Hep 2400  L AVF - mircera 50 q 2wk last 11/14  - venofer 50/wk  - sensipar 90 tiw  - calcitriol 0.75 ug tiw     Impression/ Plan: 1. STEMI/ known severe CAD: recent admit not surg candidate or PCI  candidate. Medical Rx.  Per cardiology.  2. ESRD on HD: HD TTS, HD today.  3. HTN - bp's stable, at dry wt post HD today 4. Anemia ckd - no esa yet 5. MBD ckd - cont meds    Kelly Splinter MD [facility] pager [phone]   09/18/2018, 12:42 PM   Recent Labs  Lab 09/17/18 0604 09/18/18 0237  NA 135 133*  K 4.4 5.2*  CL 94* 92*  CO2 28 24  GLUCOSE 109* 77  BUN 34* 51*  CREATININE 8.07* 10.71*  CALCIUM 7.7* 7.7*  INR 1.05  --    No results for input(s): AST, ALT, ALKPHOS, BILITOT, PROT in the last 168 hours. Recent Labs  Lab 09/17/18 0604 09/18/18 0237  WBC 9.4 9.5  HGB 11.0* 10.0*  HCT 33.6* 30.6*  MCV 100.9* 98.7  PLT 271 271

## 2018-09-18 NOTE — Progress Notes (Signed)
Progress Note  Patient Name: Kenneth [name] Date of Encounter: 09/18/2018  Primary Cardiologist: Peter [name], MD   Subjective   No dyspnea; patient states he had chest pain earlier now resolved.  He is on dialysis.  Inpatient Medications    Scheduled Meds: . aspirin  324 mg Oral NOW   Or  . aspirin  300 mg Rectal NOW  . aspirin EC  81 mg Oral Daily  . atorvastatin  80 mg Oral q1800  . calcitRIOL      . calcitRIOL      . calcitRIOL  0.75 mcg Oral Q T,Th,Sa-HD  . Chlorhexidine Gluconate Cloth  6 each Topical Q0600  . cinacalcet  60 mg Oral Q T,Th,Sa-HD  . clopidogrel  75 mg Oral Q breakfast  . divalproex  500 mg Oral BID  . folic acid  1 mg Oral Daily  . isosorbide mononitrate  60 mg Oral Daily  . lanthanum  2,000 mg Oral TID WC  . metoprolol succinate  25 mg Oral Daily  . multivitamin with minerals  1 tablet Oral Daily  . thiamine  100 mg Oral Daily   Continuous Infusions: . sodium chloride 10 mL/hr at 09/17/18 0736  . sodium chloride    . sodium chloride    . heparin 1,200 Units/hr (09/17/18 2202)  . nitroGLYCERIN Stopped (09/17/18 1137)   PRN Meds: sodium chloride, sodium chloride, acetaminophen, alteplase, heparin, lidocaine (PF), lidocaine-prilocaine, nitroGLYCERIN, ondansetron (ZOFRAN) IV, pentafluoroprop-tetrafluoroeth   Vital Signs    Vitals:   09/18/18 0515 09/18/18 0600 09/18/18 0700 09/18/18 0715  BP: 123/67 (!) 144/73 140/71 138/74  Pulse: 83 79 73 76  Resp: 20 17 16 17   Temp:   97.9 F (36.6 C)   TempSrc:   Oral   SpO2: 99% (!) 87% 99% 98%  Weight:  88.7 kg 88.7 kg   Height:        Intake/Output Summary (Last 24 hours) at 09/18/2018 0745 Last data filed at 09/18/2018 0600 Gross per 24 hour  Intake 1515.86 ml  Output -  Net 1515.86 ml   Filed Weights   09/17/18 1200 09/18/18 0600 09/18/18 0700  Weight: 89 kg 88.7 kg 88.7 kg    Telemetry    Sinus with NSVT (7 beats longest)- Personally Reviewed  Physical Exam   GEN: No acute  distress.  On dialysis Neck: No JVD Cardiac: RRR, no murmurs, rubs, or gallops.  Respiratory: Clear to auscultation bilaterally. GI: Soft, nontender, non-distended  MS: No edema Neuro:  Nonfocal  Psych: Normal affect   Labs    Chemistry Recent Labs  Lab 09/17/18 0604 09/18/18 0237  NA 135 133*  K 4.4 5.2*  CL 94* 92*  CO2 28 24  GLUCOSE 109* 77  BUN 34* 51*  CREATININE 8.07* 10.71*  CALCIUM 7.7* 7.7*  GFRNONAA 6* 4*  GFRAA 7* 5*  ANIONGAP 13 17*     Hematology Recent Labs  Lab 09/17/18 0604 09/18/18 0237  WBC 9.4 9.5  RBC 3.33* 3.10*  HGB 11.0* 10.0*  HCT 33.6* 30.6*  MCV 100.9* 98.7  MCH 33.0 32.3  MCHC 32.7 32.7  RDW 15.2 14.8  PLT 271 271    Cardiac Enzymes Recent Labs  Lab 09/17/18 1054 09/17/18 1539 09/17/18 2153  TROPONINI <0.03 <0.03 <0.03    Recent Labs  Lab 09/17/18 0615  TROPIPOC 0.02     Radiology    Dg Chest 2 View  Result Date: 09/17/2018 CLINICAL DATA:  Chest pain since last night.  Shortness of breath. EXAM: CHEST - 2 VIEW COMPARISON:  09/01/2018 FINDINGS: The heart size and mediastinal contours are within normal limits. Both lungs are clear. The visualized skeletal structures are unremarkable. IMPRESSION: No active cardiopulmonary disease. Electronically Signed   By: Lucienne Capers M.D.   On: 09/17/2018 06:36   Ct Chest Without Contrast  Result Date: 09/17/2018 CLINICAL DATA:  Left chest pain radiating to both arms. History of coronary artery disease. Assessment of the degree of aortic calcification. History of end-stage renal disease on dialysis. EXAM: CT CHEST WITHOUT CONTRAST TECHNIQUE: Multidetector CT imaging of the chest was performed following the standard protocol without IV contrast. COMPARISON:  Chest radiographs 09/17/2018 FINDINGS: Cardiovascular: There is diffuse calcific atherosclerosis throughout the thoracic aorta without aneurysm. Extensive coronary artery calcification is present including left main and  three-vessel disease. The heart is normal in size. There is no pericardial effusion. Mediastinum/Nodes: No enlarged axillary, mediastinal, or hilar lymph nodes identified within limitations of noncontrast technique. Small calcified lymph nodes in the mediastinum and left hilum compatible with prior granulomatous disease. Unremarkable esophagus and thyroid. Lungs/Pleura: No pleural effusion or pneumothorax. Moderate centrilobular emphysema. Small calcified lung nodules bilaterally. Minimal dependent atelectasis in the right lower lobe. Upper Abdomen: Extensive atherosclerosis of the upper abdominal aorta and branch vessels including renal arteries. Severe bilateral renal atrophy with small low-density renal lesions bilaterally, likely cysts but incompletely evaluated. Musculoskeletal: No acute osseous abnormality or suspicious osseous lesion. IMPRESSION: 1. No acute abnormality identified in the chest. 2. Extensive aortic and coronary artery atherosclerosis. 3. Prior granulomatous disease. Aortic Atherosclerosis (ICD10-I70.0) and Emphysema (ICD10-J43.9). Electronically Signed   By: Logan Bores M.D.   On: 09/17/2018 09:39    Patient Profile     62 y.o. male with severe multivessel coronary artery disease including left main, ostial LAD, ostial and proximal circumflex and mid right coronary artery.  Seen by cardiothoracic surgery and felt not to be a candidate for coronary artery bypass and graft.  Not a candidate for PCI given history of noncompliance with medications.  Assessment & Plan    1 CAD/chest pain-patient has ruled out.  Plan to continue aspirin, Plavix, heparin (continue for 24 hrs and DC in AM), beta-blocker and nitrates.  Continue statin.  Patient has been evaluated for coronary artery bypass and graft and felt not to be a candidate.  He could be considered for PCI but this would be very high risk as would require unprotected left main intervention, atherectomy of the LAD and bifurcation  stenting.  He also has a long-standing history of noncompliance (was not taking meds at time of this admission) and would therefore be at risk for stent thrombosis if he was not compliant with dual antiplatelet therapy.  We therefore recommend continued medical therapy.  If he demonstrates compliance with medications and follow-up in the future and also avoiding alcohol and high risk PCI could be considered at that time.  This was outlined previously and Dr. [name] note.  2 end-stage renal disease-dialysis per nephrology.  3 hypertension-patient's blood pressure is controlled.  Continue present medications and follow.  4 alcohol abuse  For questions or updates, please contact [place] Please consult [email or web address] for contact info under        Signed, Kirk Ruths, MD  09/18/2018, 7:45 AM

## 2018-09-18 NOTE — Progress Notes (Addendum)
   NAME:  Kenneth [name], MRN:  [id number], DOB:  1956-03-08, LOS: 1 ADMISSION DATE:  09/17/2018, CONSULTATION DATE:  09/17/2018 REFERRING MD:  Gunnar Bulla - ED [facility], CHIEF COMPLAINT:  Chest pain   HPI/Course in hospital  62 year old man who presented with left sided chest pain. EKG showed ST elevation in V1 and aVR with diffuse reciprocal ST depression.  Code STEMI cancelled as patient has been previously been declined for both CABG and PCI due to medical non-compliance. He was started on medical therapy for ACS.  Admitted to ICU for high risk for decompensation.   Patient reports 3 month history of typical angina described as left-sided, radiating to L arm with associated nausea diaphoresis.   Significant PMH includes CAD, ESRD secondary to HTN. Smoker, alcohol abuse. Prior CVA with cerebral atrophy and lacunar infarcts.   Consults: date of consult/date signed off & final recs:  Cardiology 11/22 Nephrology 11/23 Procedures (surgical and bedside):  None Subjective:  Patient feels greatly improved today.  Back to baseline.  No current chest pain or dyspnea  Objective   Blood pressure 115/66, pulse 76, temperature (!) 97.4 F (36.3 C), temperature source Oral, resp. rate 18, height 5\' 11"  (1.803 m), weight 93.2 kg, SpO2 99 %.        Intake/Output Summary (Last 24 hours) at 09/18/2018 2131 Last data filed at 09/18/2018 2127 Gross per 24 hour  Intake 1274 ml  Output 430 ml  Net 844 ml   Filed Weights   09/18/18 0600 09/18/18 0700 09/18/18 1534  Weight: 88.7 kg 88.7 kg 93.2 kg    Examination: General: NAD, not very cooperative with interview.  Getting dialysis.  HENT: NCAT  Lungs: CTAB Cardiovascular: RRR no MGR, no edema in ext Abdomen: soft and non-tender. Extremities: normal, being dialyzed via fistula Neuro: Alert, no focal deficits.  Resolved Hospital Problem list    Assessment & Plan:  Chest pain/angina/CAD: STE in AVR, st depression elsewhere on admission concurrent  with chest pain.  Resolved, EKG returned to baseline.  NO elevation in troponin.   Know severe 3 vessel coronary disease, optimally managed surgically, but refused initially due to high operative risk and poor compliance.  Cont medical management with heparin x 48 hrs, ASA, plavix, statin, beta blocker.   ESRD on HD: HD today.  Follow up post HD labs.    History of alcohol abuse Monitor for alcohol withdrawal   Stable for transfer out of ICU to [facility].  Discussed with Dr. Rickey Primus.   Disposition / Summary of Today's Plan 09/18/18       Nutritional status and diet: fluid restricted, heart healthy. Pain/Anxiety/Delirium reduction: None required. VAP protocol (if indicated) N/A DVT prophylaxis: systemic anticoagulation. GI prophylaxis: not indicated.  Hyperglycemia protocol: phase 1 protocol* Mobility: progressive ambulation starting tomorrow. Antibiotic de-escalation: N/A Code Status: Full - will consult Palliative Care to assist in goals of care discussion. Family Communication: no family present  Total cc time 45 min.

## 2018-09-18 NOTE — Progress Notes (Signed)
ANTICOAGULATION CONSULT NOTE  Pharmacy Consult for heparin Indication: chest pain/ACS  No Known Allergies  Patient Measurements: Height: 5\' 11"  (180.3 cm) Weight: 195 lb 8.8 oz (88.7 kg)(EDW: 88.5 kg ) IBW/kg (Calculated) : 75.3 Heparin Dosing Weight: 86.2kg  Vital Signs: Temp: 97.8 F (36.6 C) (11/23 0755) Temp Source: Axillary (11/23 0755) BP: 111/64 (11/23 0918) Pulse Rate: 87 (11/23 0918)  Labs: Recent Labs    09/17/18 0604 09/17/18 1054 09/17/18 1539 09/17/18 2153 09/18/18 0237  HGB 11.0*  --   --   --  10.0*  HCT 33.6*  --   --   --  30.6*  PLT 271  --   --   --  271  APTT 37*  --   --   --   --   LABPROT 13.6  --   --   --   --   INR 1.05  --   --   --   --   HEPARINUNFRC  --   --  0.39  --  0.27*  CREATININE 8.07*  --   --   --  10.71*  TROPONINI  --  <0.03 <0.03 <0.03  --     Estimated Creatinine Clearance: 7.4 mL/min (A) (by C-G formula based on SCr of 10.71 mg/dL (H)).   Medical History: Past Medical History:  Diagnosis Date  . Acute CHF ([place]) 01/2018  . Cardiomyopathy secondary    likely related to HTN heart disease; possibly ETOH related as well  . Chronic combined systolic and diastolic heart failure (HCC)    Echocardiogram 09/22/11: Moderate LVH, EF 53-96%, grade 3 diastolic dysfunction, mild MR, moderate to severe LAE, mild RVE, mild to moderate TR, small to moderate pericardial effusion  . ESRD (end stage renal disease) on dialysis ([place])    due to hypertensive nephrosclerosis; TTS; [facility] (09/01/2018)  . History of alcohol abuse   . Hypertension     Medications:  Infusions:  . sodium chloride 10 mL/hr at 09/17/18 0736  . sodium chloride    . sodium chloride    . heparin 1,200 Units/hr (09/17/18 2202)  . nitroGLYCERIN Stopped (09/17/18 1137)    Assessment: 40 yom presented to the ED with CP and SOB and on heparin for r/o ACS. Known history of afib but no anticoagulation due to non-compliance and EtOH abuse.  -heparin level just  below goal this morning.  -No bleeding issues noted -Plan to stop heparin in am  Goal of Therapy:  Heparin level 0.3-0.7 units/ml Monitor platelets by anticoagulation protocol: Yes   Plan:  -Increase heparin to 1350 units/hr -Daily heparin level and CBC  Erin Hearing PharmD., BCPS Clinical Pharmacist 09/18/2018 9:33 AM

## 2018-09-19 ENCOUNTER — Other Ambulatory Visit: Payer: Self-pay

## 2018-09-19 DIAGNOSIS — Z7189 Other specified counseling: Secondary | ICD-10-CM

## 2018-09-19 DIAGNOSIS — Z515 Encounter for palliative care: Secondary | ICD-10-CM

## 2018-09-19 DIAGNOSIS — I25709 Atherosclerosis of coronary artery bypass graft(s), unspecified, with unspecified angina pectoris: Secondary | ICD-10-CM

## 2018-09-19 DIAGNOSIS — R072 Precordial pain: Secondary | ICD-10-CM

## 2018-09-19 LAB — CBC
HCT: 29.7 % — ABNORMAL LOW (ref 39.0–52.0)
HEMATOCRIT: 31.6 % — AB (ref 39.0–52.0)
Hemoglobin: 9.7 g/dL — ABNORMAL LOW (ref 13.0–17.0)
Hemoglobin: 10.5 g/dL — ABNORMAL LOW (ref 13.0–17.0)
MCH: 32.4 pg (ref 26.0–34.0)
MCH: 33.2 pg (ref 26.0–34.0)
MCHC: 32.7 g/dL (ref 30.0–36.0)
MCHC: 33.2 g/dL (ref 30.0–36.0)
MCV: 99.3 fL (ref 80.0–100.0)
MCV: 100 fL (ref 80.0–100.0)
NRBC: 0 % (ref 0.0–0.2)
PLATELETS: 256 10*3/uL (ref 150–400)
PLATELETS: 225 10*3/uL (ref 150–400)
RBC: 2.99 MIL/uL — AB (ref 4.22–5.81)
RBC: 3.16 MIL/uL — ABNORMAL LOW (ref 4.22–5.81)
RDW: 14.9 % (ref 11.5–15.5)
RDW: 15 % (ref 11.5–15.5)
WBC: 9 10*3/uL (ref 4.0–10.5)
WBC: 8.3 10*3/uL (ref 4.0–10.5)
nRBC: 0 % (ref 0.0–0.2)

## 2018-09-19 LAB — RENAL FUNCTION PANEL
ALBUMIN: 3.2 g/dL — AB (ref 3.5–5.0)
ANION GAP: 12 (ref 5–15)
BUN: 42 mg/dL — AB (ref 8–23)
CALCIUM: 8.1 mg/dL — AB (ref 8.9–10.3)
CO2: 26 mmol/L (ref 22–32)
CREATININE: 9.97 mg/dL — AB (ref 0.61–1.24)
Chloride: 97 mmol/L — ABNORMAL LOW (ref 98–111)
GFR calc non Af Amer: 5 mL/min — ABNORMAL LOW (ref >60)
GFR, EST AFRICAN AMERICAN: 6 mL/min — AB (ref >60)
GLUCOSE: 97 mg/dL (ref 70–99)
Phosphorus: 4 mg/dL (ref 2.5–4.6)
Potassium: 4.8 mmol/L (ref 3.5–5.1)
Sodium: 135 mmol/L (ref 135–145)

## 2018-09-19 LAB — HEPARIN LEVEL (UNFRACTIONATED): Heparin Unfractionated: 0.38 IU/mL (ref 0.30–0.70)

## 2018-09-19 MED ORDER — LIDOCAINE-PRILOCAINE 2.5-2.5 % EX CREA: 1.0000 "application " | TOPICAL_CREAM | CUTANEOUS | Status: DC | PRN

## 2018-09-19 MED ORDER — SODIUM CHLORIDE 0.9 % IV SOLN: 100.0000 mL | INTRAVENOUS | Status: DC | PRN

## 2018-09-19 MED ORDER — CHLORHEXIDINE GLUCONATE CLOTH 2 % EX PADS
6.0000 | MEDICATED_PAD | Freq: Every day | CUTANEOUS | Status: DC
  Administered 2018-09-19 – 2018-09-21 (×3): 6 via TOPICAL

## 2018-09-19 MED ORDER — HEPARIN SODIUM (PORCINE) 1000 UNIT/ML DIALYSIS: 1000.0000 [IU] | INTRAMUSCULAR | Status: DC | PRN

## 2018-09-19 MED ORDER — PENTAFLUOROPROP-TETRAFLUOROETH EX AERO
1.0000 "application " | INHALATION_SPRAY | CUTANEOUS | Status: DC | PRN
  Filled 2018-09-19: qty 116

## 2018-09-19 MED ORDER — ALTEPLASE 2 MG IJ SOLR: 2.0000 mg | Freq: Once | INTRAMUSCULAR | Status: DC | PRN

## 2018-09-19 MED ORDER — HEPARIN SODIUM (PORCINE) 1000 UNIT/ML DIALYSIS: 2400.0000 [IU] | Freq: Once | INTRAMUSCULAR | Status: DC

## 2018-09-19 MED ORDER — LIDOCAINE HCL (PF) 1 % IJ SOLN: 5.0000 mL | INTRAMUSCULAR | Status: DC | PRN

## 2018-09-19 NOTE — Progress Notes (Signed)
Progress Note  Patient Name: Kenneth [name] Date of Encounter: 09/19/2018  Primary Cardiologist: Peter [name], MD   Subjective   No chest pain or dyspnea this morning.  Inpatient Medications    Scheduled Meds: . aspirin EC  81 mg Oral Daily  . atorvastatin  80 mg Oral q1800  . calcitRIOL  0.75 mcg Oral Q T,Th,Sa-HD  . Chlorhexidine Gluconate Cloth  6 each Topical Q0600  . Chlorhexidine Gluconate Cloth  6 each Topical Q0600  . cinacalcet  60 mg Oral Q T,Th,Sa-HD  . clopidogrel  75 mg Oral Q breakfast  . divalproex  500 mg Oral BID  . folic acid  1 mg Oral Daily  . isosorbide mononitrate  60 mg Oral Daily  . lanthanum  2,000 mg Oral TID WC  . metoprolol succinate  25 mg Oral Daily  . multivitamin with minerals  1 tablet Oral Daily  . thiamine  100 mg Oral Daily   Continuous Infusions: . sodium chloride 10 mL/hr at 09/18/18 1900  . sodium chloride    . sodium chloride    . heparin 1,350 Units/hr (09/18/18 1936)  . nitroGLYCERIN Stopped (09/17/18 1137)   PRN Meds: sodium chloride, sodium chloride, acetaminophen, alteplase, heparin, lidocaine (PF), lidocaine-prilocaine, nitroGLYCERIN, ondansetron (ZOFRAN) IV, pentafluoroprop-tetrafluoroeth   Vital Signs    Vitals:   09/19/18 0344 09/19/18 0348 09/19/18 0400 09/19/18 0830  BP: 132/74 116/66 129/68 (!) 117/96  Pulse:  76 72 79  Resp: 20 18 19 20   Temp:  97.9 F (36.6 C)  98.5 F (36.9 C)  TempSrc:  Oral  Axillary  SpO2:   100% 95%  Weight:      Height:        Intake/Output Summary (Last 24 hours) at 09/19/2018 1041 Last data filed at 09/19/2018 0400 Gross per 24 hour  Intake 602 ml  Output 430 ml  Net 172 ml   Filed Weights   09/18/18 0600 09/18/18 0700 09/18/18 1534  Weight: 88.7 kg 88.7 kg 93.2 kg    Telemetry    Sinus- Personally Reviewed  Physical Exam   GEN: WD, WN No acute distress Neck: No JVD, supple Cardiac: RRR Respiratory: CTA GI: Soft, NT/ND MS: No edema Neuro:  Grossly  intact   Labs    Chemistry Recent Labs  Lab 09/17/18 0604 09/18/18 0237 09/18/18 1815  NA 135 133* 134*  K 4.4 5.2* 4.4  CL 94* 92* 97*  CO2 28 24 25   GLUCOSE 109* 77 93  BUN 34* 51* 21  CREATININE 8.07* 10.71* 6.58*  CALCIUM 7.7* 7.7* 7.6*  GFRNONAA 6* 4* 8*  GFRAA 7* 5* 9*  ANIONGAP 13 17* 12     Hematology Recent Labs  Lab 09/17/18 0604 09/18/18 0237 09/19/18 0210  WBC 9.4 9.5 9.0  RBC 3.33* 3.10* 2.99*  HGB 11.0* 10.0* 9.7*  HCT 33.6* 30.6* 29.7*  MCV 100.9* 98.7 99.3  MCH 33.0 32.3 32.4  MCHC 32.7 32.7 32.7  RDW 15.2 14.8 14.9  PLT 271 271 256    Cardiac Enzymes Recent Labs  Lab 09/17/18 1054 09/17/18 1539 09/17/18 2153  TROPONINI <0.03 <0.03 <0.03    Recent Labs  Lab 09/17/18 0615  TROPIPOC 0.02     Radiology    No results found.  Patient Profile     62 y.o. male with severe multivessel coronary artery disease including left main, ostial LAD, ostial and proximal circumflex and mid right coronary artery.  Seen by cardiothoracic surgery and felt not to  be a candidate for coronary artery bypass and graft.  Not a candidate for PCI given history of noncompliance with medications.  Assessment & Plan    1 CAD/chest pain-patient denies chest pain.  Enzymes negative.  We will continue aspirin, Plavix and statin.  Continue beta-blocker and imdur.  Discontinue IV nitroglycerin and heparin.  As outlined in previous note patient has been evaluated for coronary artery bypass and graft and felt not to be a candidate.  He could be considered for PCI but this would be very high risk as would require unprotected left main intervention, atherectomy of the LAD and bifurcation stenting.  He also has a long-standing history of noncompliance (was not taking meds at time of this admission) and would therefore be at risk for stent thrombosis if he was not compliant with dual antiplatelet therapy.  We therefore recommend continued medical therapy.  If he demonstrates  compliance with medications and follow-up in the future and also avoiding alcohol then high risk PCI could be considered at that time.    2 end-stage renal disease-dialysis per nephrology.  3 hypertension-patient's blood pressure is controlled.  Continue present medications and follow.  4 alcohol abuse  For questions or updates, please contact [place] Please consult [email or web address] for contact info under        Signed, Kirk Ruths, MD  09/19/2018, 10:41 AM

## 2018-09-19 NOTE — Consult Note (Signed)
Consultation Note Date: 09/19/2018   Patient Name: Kenneth [name]  DOB: 08/06/1956  MRN: [id number]  Age / Sex: 62 y.o., male  PCP: Banks, Shannon R, MD Referring Physician: Arrien, Mauricio Daniel,*  Reason for Consultation: Establishing goals of care  HPI/Patient Profile: 62 y.o. male  with past medical history of severe multivessel CAD, ESRD on HD, HTN, anemia, smoking, ETOH abuse, and CVA  admitted on 09/17/2018 with chest pain. Patient felt not to be a candidate for surgery or PCI given history of noncompliance with medications. Per cardiology,  if he demonstrates compliance with medications and follow-up in the future and also avoiding alcohol then high risk PCI could be considered at that time. PMT consulted for GOC.  Clinical Assessment and Goals of Care: I have reviewed medical records including EPIC notes, labs and imaging, received report from RN, assessed the patient and then met with patient  to discuss diagnosis prognosis, GOC, EOL wishes, disposition and options.  I introduced Palliative Medicine as specialized medical care for people living with serious illness. It focuses on providing relief from the symptoms and stress of a serious illness. The goal is to improve quality of life for both the patient and the family.  We discussed a brief life review of the patient. He tells me about his children - he lives with 2 daughters and 1 son. He tells me he enjoys watching football. He does not disclose much information about his functional or nutritional status.   Throughout conversation, [name] was a bit flat and withdrawn but did answer questions appropriately and had awareness of medical situation - told me about his HD graft and understood that his heart condition had to be managed by medications because he was not a surgical or PCI candidate.   We discussed his current illness and what it means in the larger context of his on-going  co-morbidities.  Natural disease trajectory and expectations at EOL were discussed. We discussed patient's medical noncompliance.  I attempted to elicit values and goals of care important to the patient.     Advance directives, concepts specific to code status, artifical feeding and hydration, and rehospitalization were considered and discussed. We discussed MOST form and patient completed the form with the following selections: DNR, if has pulse/breathing - full scope interventions, antibiotics if indicated, IV fluids if indicated, and no feeding tube. MOST form placed in patient's chart.   I asked him about HCPOA and he tells me if he were unable to make decisions for himself he would want his daughter, [name], to do so. I have asked spiritual care to complete complete HCPOA document.   I offered to call the patient's daughter to share our conversation with her to which he replied "don't call her, I don't want her to worry".  Questions and concerns were addressed. The patient was encouraged to call with questions or concerns.   Primary Decision Maker PATIENT  Named daughter, [name], as HCPOA if he were unable - spiritual care consult for HCPOA documentation  SUMMARY OF RECOMMENDATIONS   Discussed medical diagnoses with patient and provided education Patient elected DNR status - MOST completed which indicated DNR, full scope interventions if has a pulse/breathing, abx if indication, IV fluids if indicated, and NO feeding tube Continue to treat the treatable Patient likely eligible for hospice; however, at this time this is not in line with his goals of care to continue treatment/hospitalizations  Code Status/Advance Care Planning:  DNR  Additional Recommendations (Limitations, Scope, Preferences):    No Artificial Feeding  Psycho-social/Spiritual:   Desire for further Chaplaincy support:yes  Additional Recommendations: Education on Hospice  Prognosis:   < 6 months  Discharge  Planning: To Be Determined      Primary Diagnoses: Present on Admission: . STEMI (ST elevation myocardial infarction) (HCC)   I have reviewed the medical record, interviewed the patient and family, and examined the patient. The following aspects are pertinent.  Past Medical History:  Diagnosis Date  . Acute CHF (HCC) 01/2018  . Cardiomyopathy secondary    likely related to HTN heart disease; possibly ETOH related as well  . Chronic combined systolic and diastolic heart failure (HCC)    Echocardiogram 09/22/11: Moderate LVH, EF 30-35%, grade 3 diastolic dysfunction, mild MR, moderate to severe LAE, mild RVE, mild to moderate TR, small to moderate pericardial effusion  . ESRD (end stage renal disease) on dialysis (HCC)    due to hypertensive nephrosclerosis; TTS; [facility] (09/01/2018)  . History of alcohol abuse   . Hypertension    Social History   Socioeconomic History  . Marital status: Divorced    Spouse name: Not on file  . Number of children: Not on file  . Years of education: Not on file  . Highest education level: Not on file  Occupational History  . Not on file  Social Needs  . Financial resource strain: Not on file  . Food insecurity:    Worry: Not on file    Inability: Not on file  . Transportation needs:    Medical: Not on file    Non-medical: Not on file  Tobacco Use  . Smoking status: Former Smoker    Packs/day: 2.00    Years: 48.00    Pack years: 96.00    Types: Cigars    Last attempt to quit: 10/27/2017    Years since quitting: 0.8  . Smokeless tobacco: Former User    Types: Chew  Substance and Sexual Activity  . Alcohol use: Yes    Frequency: Never    Comment: 09/01/2018 "nothing last 5 months"  . Drug use: No  . Sexual activity: Not Currently  Lifestyle  . Physical activity:    Days per week: Not on file    Minutes per session: Not on file  . Stress: Not on file  Relationships  . Social connections:    Talks on phone: Not on file    Gets  together: Not on file    Attends religious service: Not on file    Active member of club or organization: Not on file    Attends meetings of clubs or organizations: Not on file    Relationship status: Not on file  Other Topics Concern  . Not on file  Social History Narrative  . Not on file   Family History  Problem Relation Age of Onset  . Emphysema Mother   . Cirrhosis Father    Scheduled Meds: . aspirin EC  81 mg Oral Daily  . atorvastatin  80 mg Oral q1800  . calcitRIOL  0.75 mcg Oral Q T,Th,Sa-HD  . Chlorhexidine Gluconate Cloth  6 each Topical Q0600  . Chlorhexidine Gluconate Cloth  6 each Topical Q0600  . cinacalcet  60 mg Oral Q T,Th,Sa-HD  . clopidogrel  75 mg Oral Q breakfast  . divalproex  500 mg Oral BID  . folic acid  1 mg Oral Daily  . isosorbide mononitrate  60 mg Oral Daily  . lanthanum  2,000 mg Oral TID WC  .   metoprolol succinate  25 mg Oral Daily  . multivitamin with minerals  1 tablet Oral Daily  . thiamine  100 mg Oral Daily   Continuous Infusions: . sodium chloride 10 mL/hr at 09/18/18 1900  . sodium chloride    . sodium chloride     PRN Meds:.sodium chloride, sodium chloride, acetaminophen, alteplase, heparin, lidocaine (PF), lidocaine-prilocaine, nitroGLYCERIN, ondansetron (ZOFRAN) IV, pentafluoroprop-tetrafluoroeth No Known Allergies Review of Systems  All other systems reviewed and are negative.   Physical Exam  Constitutional: He is oriented to person, place, and time. No distress.  HENT:  Head: Normocephalic and atraumatic.  Cardiovascular: Normal rate and regular rhythm.  Pulmonary/Chest: Effort normal and breath sounds normal.  Abdominal: Soft.  Musculoskeletal:       Right lower leg: He exhibits no edema.       Left lower leg: He exhibits no edema.  Neurological: He is alert and oriented to person, place, and time.  Skin: Skin is warm and dry.  Psychiatric: His speech is normal. He is withdrawn.  flat He is attentive.    Vital  Signs: BP (!) 117/96 (BP Location: Right Arm)   Pulse 79   Temp 98.5 F (36.9 C) (Axillary)   Resp 20   Ht 5' 11" (1.803 m)   Wt 93.2 kg   SpO2 95%   BMI 28.66 kg/m  Pain Scale: 0-10 POSS *See Group Information*: 1-Acceptable,Awake and alert Pain Score: 0-No pain   SpO2: SpO2: 95 % O2 Device:SpO2: 95 % O2 Flow Rate: .   IO: Intake/output summary:   Intake/Output Summary (Last 24 hours) at 09/19/2018 1057 Last data filed at 09/19/2018 1053 Gross per 24 hour  Intake 665 ml  Output 430 ml  Net 235 ml    LBM: Last BM Date: 09/18/18 Baseline Weight: Weight: 86.2 kg Most recent weight: Weight: 93.2 kg     Palliative Assessment/Data: PPS 50%     Time Total: 70 minutes Greater than 50%  of this time was spent counseling and coordinating care related to the above assessment and plan.  Juel Burrow, DNP, AGNP-C Palliative Medicine Team [phone] Pager: [phone]

## 2018-09-19 NOTE — Progress Notes (Signed)
PROGRESS NOTE    Kenneth [name]  NWG:[id number] DOB: 1956/02/04 DOA: 09/17/2018 PCP: [name], MD    Brief Narrative:  62 year old male who presented with left-sided chest pain.  He does have significant past medical history for diastolic heart failure, alcohol abuse, atrial fibrillation, coronary artery disease, and end-stage renal disease on hemodialysis. Reported a sudden chest pressure, associated with dyspnea, radiating to his left arm.  Patient has been noncompliant with his medications and continued to drink alcohol.  On his initial physical examination blood pressure 125/68, heart rate 79, respiratory rate 18, temp 97.7, oxygen saturation 97%, his lungs were clear to auscultation bilaterally, heart S1-S2 present and rhythmic, abdomen soft nontender, no lower extremity edema.  Sodium 135, potassium 4.4, chloride 94, bicarb 28, glucose 109, BUN 34, creatinine 8.0, troponin I < 0.03, white count 9.4, hemoglobin 11.0, hematocrit 33.6, platelets 271.  Chest x-ray with congested hilar regions, no infiltrates, CT chest with faint groundglass bilaterally, EKG sinus rhythm, inferior lateral ST depressions with aVR, V1 and V2 ST elevations (new).   STEMI code was canceled due to poor candidate for PCI, in the past he was deemed not candidate for coronary bypass grafting.  He was admitted to the hospital with working diagnosis of non-ST elevation microinfarction.  Assessment & Plan:   Active Problems:   CKD (chronic kidney disease) stage 5, GFR less than 15 ml/min (HCC)   STEMI (ST elevation myocardial infarction) ([place])   Coronary artery disease involving native heart with angina pectoris ([place])  1. STEMI in the setting of severe multivessel coronary artery disease. Patient is chest pain free, no dyspnea. He has been deemed not candidate for PCI or CABG, will continue aggressive guideline directed medical therapy, with aspirin, atorvastatin, clopidogrel, isosorbide, and metoprolol.  Heparin and nitroglycerin drips have been discontinued. Follow echocardiogram with preserved LV systolic function, Ef 55 to 60%.   2.HTN. Continue blood pressure control with isosorbide, and metoprolol.   3. ESRD on HD. No signs of hypervolemia, today scheduled for routine HD. Continue fosrenol, sensipar and calcitriol.   4. Alcohol abuse. No signs of active withdrawal, continue neuro checks per unit protocol. Continue thiamine.   DVT prophylaxis: heparin   Code Status:  dnr  Family Communication: no family at the bedside  Disposition Plan/ discharge barriers: pending clinical improvement   Body mass index is 28.66 kg/m. Malnutrition Type:      Malnutrition Characteristics:      Nutrition Interventions:     RN Pressure Injury Documentation:     Consultants:   Cardiology   Nephrology   Procedures:     Antimicrobials:       Subjective: Patient with no chest pain or dyspnea, no nausea or vomiting.   Objective: Vitals:   09/18/18 2300 09/19/18 0344 09/19/18 0348 09/19/18 0400  BP: 120/63 132/74 116/66 129/68  Pulse: 75  76 72  Resp: (!) 21 20 18 19   Temp: 98 F (36.7 C)  97.9 F (36.6 C)   TempSrc: Oral  Oral   SpO2: 100%   100%  Weight:      Height:        Intake/Output Summary (Last 24 hours) at 09/19/2018 0842 Last data filed at 09/19/2018 0400 Gross per 24 hour  Intake 886 ml  Output 430 ml  Net 456 ml   Filed Weights   09/18/18 0600 09/18/18 0700 09/18/18 1534  Weight: 88.7 kg 88.7 kg 93.2 kg    Examination:   General: Not in pain  or dyspnea. Deconditioned  Neurology: Awake and alert, non focal  E ENT: mild pallor, no icterus, oral mucosa moist Cardiovascular: No JVD. S1-S2 present, rhythmic, no gallops, rubs, or murmurs. Trace lower extremity edema. Pulmonary: decreased breath sounds bilaterally, adequate air movement, no wheezing, rhonchi or rales. Gastrointestinal. Abdomen with no organomegaly, non tender, no rebound or  guarding Skin. No rashes Musculoskeletal: no joint deformities     Data Reviewed: I have personally reviewed following labs and imaging studies  CBC: Recent Labs  Lab 09/17/18 0604 09/18/18 0237 09/19/18 0210  WBC 9.4 9.5 9.0  HGB 11.0* 10.0* 9.7*  HCT 33.6* 30.6* 29.7*  MCV 100.9* 98.7 99.3  PLT 271 271 427   Basic Metabolic Panel: Recent Labs  Lab 09/17/18 0604 09/18/18 0237 09/18/18 1815  NA 135 133* 134*  K 4.4 5.2* 4.4  CL 94* 92* 97*  CO2 28 24 25   GLUCOSE 109* 77 93  BUN 34* 51* 21  CREATININE 8.07* 10.71* 6.58*  CALCIUM 7.7* 7.7* 7.6*   GFR: Estimated Creatinine Clearance: 13.2 mL/min (A) (by C-G formula based on SCr of 6.58 mg/dL (H)). Liver Function Tests: No results for input(s): AST, ALT, ALKPHOS, BILITOT, PROT, ALBUMIN in the last 168 hours. No results for input(s): LIPASE, AMYLASE in the last 168 hours. No results for input(s): AMMONIA in the last 168 hours. Coagulation Profile: Recent Labs  Lab 09/17/18 0604  INR 1.05   Cardiac Enzymes: Recent Labs  Lab 09/17/18 1054 09/17/18 1539 09/17/18 2153  TROPONINI <0.03 <0.03 <0.03   BNP (last 3 results) No results for input(s): PROBNP in the last 8760 hours. HbA1C: No results for input(s): HGBA1C in the last 72 hours. CBG: No results for input(s): GLUCAP in the last 168 hours. Lipid Profile: Recent Labs    09/17/18 0604  CHOL 204*  HDL 47  LDLCALC 120*  TRIG 185*  CHOLHDL 4.3   Thyroid Function Tests: No results for input(s): TSH, T4TOTAL, FREET4, T3FREE, THYROIDAB in the last 72 hours. Anemia Panel: No results for input(s): VITAMINB12, FOLATE, FERRITIN, TIBC, IRON, RETICCTPCT in the last 72 hours.    Radiology Studies: I have reviewed all of the imaging during this hospital visit personally     Scheduled Meds: . aspirin EC  81 mg Oral Daily  . atorvastatin  80 mg Oral q1800  . calcitRIOL  0.75 mcg Oral Q T,Th,Sa-HD  . Chlorhexidine Gluconate Cloth  6 each Topical Q0600    . Chlorhexidine Gluconate Cloth  6 each Topical Q0600  . cinacalcet  60 mg Oral Q T,Th,Sa-HD  . clopidogrel  75 mg Oral Q breakfast  . divalproex  500 mg Oral BID  . folic acid  1 mg Oral Daily  . isosorbide mononitrate  60 mg Oral Daily  . lanthanum  2,000 mg Oral TID WC  . metoprolol succinate  25 mg Oral Daily  . multivitamin with minerals  1 tablet Oral Daily  . thiamine  100 mg Oral Daily   Continuous Infusions: . sodium chloride 10 mL/hr at 09/18/18 1900  . sodium chloride    . sodium chloride    . heparin 1,350 Units/hr (09/18/18 1936)  . nitroGLYCERIN Stopped (09/17/18 1137)     LOS: 2 days         Gerome Apley, MD [facility] Pager [phone]

## 2018-09-19 NOTE — Plan of Care (Signed)
  Problem: Education: Goal: Individualized Educational Video(s) Outcome: Progressing   Problem: Activity: Goal: Ability to tolerate increased activity will improve Outcome: Progressing   Problem: Cardiac: Goal: Ability to achieve and maintain adequate cardiopulmonary perfusion will improve Outcome: Progressing Goal: Vascular access site(s) Level 0-1 will be maintained Outcome: Progressing   Problem: Education: Goal: Individualized Educational Video(s) Outcome: Progressing   Problem: Fluid Volume: Goal: Compliance with measures to maintain balanced fluid volume will improve Outcome: Progressing   Problem: Nutritional: Goal: Ability to make healthy dietary choices will improve Outcome: Progressing   Problem: Clinical Measurements: Goal: Ability to maintain clinical measurements within normal limits will improve Outcome: Progressing Goal: Will remain free from infection Outcome: Progressing Goal: Diagnostic test results will improve Outcome: Progressing Goal: Respiratory complications will improve Outcome: Progressing Goal: Cardiovascular complication will be avoided Outcome: Progressing   Problem: Activity: Goal: Risk for activity intolerance will decrease Outcome: Progressing   Problem: Coping: Goal: Level of anxiety will decrease Outcome: Progressing   Problem: Elimination: Goal: Will not experience complications related to bowel motility Outcome: Progressing Goal: Will not experience complications related to urinary retention Outcome: Progressing   Problem: Pain Managment: Goal: General experience of comfort will improve Outcome: Progressing   Problem: Safety: Goal: Ability to remain free from injury will improve Outcome: Progressing   Problem: Skin Integrity: Goal: Risk for impaired skin integrity will decrease Outcome: Progressing

## 2018-09-19 NOTE — Progress Notes (Signed)
[facility] Progress Note  Subjective: in room, feet up, no distress. Having some chest pain  Vitals:   09/18/18 2300 09/19/18 0344 09/19/18 0348 09/19/18 0400  BP: 120/63 132/74 116/66 129/68  Pulse: 75  76 72  Resp: (!) 21 20 18 19   Temp: 98 F (36.7 C)  97.9 F (36.6 C)   TempSrc: Oral  Oral   SpO2: 100%   100%  Weight:      Height:        Inpatient medications: . aspirin EC  81 mg Oral Daily  . atorvastatin  80 mg Oral q1800  . calcitRIOL  0.75 mcg Oral Q T,Th,Sa-HD  . Chlorhexidine Gluconate Cloth  6 each Topical Q0600  . cinacalcet  60 mg Oral Q T,Th,Sa-HD  . clopidogrel  75 mg Oral Q breakfast  . divalproex  500 mg Oral BID  . folic acid  1 mg Oral Daily  . isosorbide mononitrate  60 mg Oral Daily  . lanthanum  2,000 mg Oral TID WC  . metoprolol succinate  25 mg Oral Daily  . multivitamin with minerals  1 tablet Oral Daily  . thiamine  100 mg Oral Daily   . sodium chloride 10 mL/hr at 09/18/18 1900  . sodium chloride    . sodium chloride    . heparin 1,350 Units/hr (09/18/18 1936)  . nitroGLYCERIN Stopped (09/17/18 1137)   sodium chloride, sodium chloride, acetaminophen, alteplase, heparin, lidocaine (PF), lidocaine-prilocaine, nitroGLYCERIN, ondansetron (ZOFRAN) IV, pentafluoroprop-tetrafluoroeth  Iron/TIBC/Ferritin/ %Sat    Component Value Date/Time   IRON 49 03/21/2018 1404   TIBC 207 (L) 03/21/2018 1404   FERRITIN 1,273 (H) 03/21/2018 1404   IRONPCTSAT 24 03/21/2018 1404    Exam: GEN appears older than stated age 62 EOMI PERRL, muddy sclerae NECK no JVD PULM normal WOB, clear bilaterally  CV regular today, soft systolic murmur ABD soft, mildly distended, NABS EXT no LE edema NEURO AAO x 3 ACCESS LUE AVF, aneurysmal, + T/B  Dialysis: TTS [facility]  4h 66min   2/2 bath  88.5kg  Hep 2400  L AVF - mircera 50 q 2wk last 11/14  - venofer 50/wk  - sensipar 90 tiw  - calcitriol 0.75 ug tiw     Impression/ Plan: 1. STEMI/ known  severe CAD: recent admit not surg candidate or PCI candidate. Medical Rx.  Per cardiology.  2. ESRD on HD: HD TTS. HD tomorrow on holiday sched 3. HTN - BP's good. Was at dry wt after hd yest 4. Anemia ckd - no esa yet 5. MBD ckd - cont meds    Kelly Splinter MD [facility] pager [phone]   09/19/2018, 7:04 AM   Recent Labs  Lab 09/17/18 0604 09/18/18 0237 09/18/18 1815  NA 135 133* 134*  K 4.4 5.2* 4.4  CL 94* 92* 97*  CO2 28 24 25   GLUCOSE 109* 77 93  BUN 34* 51* 21  CREATININE 8.07* 10.71* 6.58*  CALCIUM 7.7* 7.7* 7.6*  INR 1.05  --   --    No results for input(s): AST, ALT, ALKPHOS, BILITOT, PROT in the last 168 hours. Recent Labs  Lab 09/18/18 0237 09/19/18 0210  WBC 9.5 9.0  HGB 10.0* 9.7*  HCT 30.6* 29.7*  MCV 98.7 99.3  PLT 271 256

## 2018-09-19 NOTE — Progress Notes (Signed)
   09/19/18 1600  Clinical Encounter Type  Visited With Patient  Visit Type Initial  Referral From Nurse  Consult/Referral To Chaplain  Spiritual Encounters  Spiritual Needs Other (Comment)  Stress Factors  Patient Stress Factors None identified   Responded to spiritual care consult. PT was alert and watching TV. I gave PT a copy and an overview of the AD he requested. I offered spiritual support with words of encouragement and ministry of presence. Chaplain available as needed.   Chaplain Fidel Levy [phone]

## 2018-09-19 NOTE — Progress Notes (Addendum)
@  0344 patient complained about chest pain. 1 ntg SL given, EKG done, Oxygen on briefly.  Patient report improvement with 1 ntg. Pain from 9/10 to 4/10 than 1/10. No further complaints

## 2018-09-20 ENCOUNTER — Ambulatory Visit: Payer: Medicare Other | Admitting: Physician Assistant

## 2018-09-20 DIAGNOSIS — I2101 ST elevation (STEMI) myocardial infarction involving left main coronary artery: Secondary | ICD-10-CM

## 2018-09-20 DIAGNOSIS — N186 End stage renal disease: Secondary | ICD-10-CM

## 2018-09-20 DIAGNOSIS — I213 ST elevation (STEMI) myocardial infarction of unspecified site: Principal | ICD-10-CM

## 2018-09-20 MED ORDER — CINACALCET HCL 30 MG PO TABS: 60.0000 mg | ORAL_TABLET | ORAL | 0 refills | Status: AC

## 2018-09-20 MED ORDER — ASPIRIN 81 MG PO TBEC: 81.0000 mg | DELAYED_RELEASE_TABLET | Freq: Every day | ORAL | 0 refills | Status: DC

## 2018-09-20 MED ORDER — ISOSORBIDE MONONITRATE ER 60 MG PO TB24: 60.0000 mg | ORAL_TABLET | Freq: Every day | ORAL | 0 refills | Status: DC

## 2018-09-20 MED ORDER — CLOPIDOGREL BISULFATE 75 MG PO TABS: 75.0000 mg | ORAL_TABLET | Freq: Every day | ORAL | 0 refills | Status: DC

## 2018-09-20 MED ORDER — ADULT MULTIVITAMIN W/MINERALS CH: 1.0000 | ORAL_TABLET | Freq: Every day | ORAL | 0 refills | Status: AC

## 2018-09-20 MED ORDER — METOPROLOL SUCCINATE ER 25 MG PO TB24: 25.0000 mg | ORAL_TABLET | Freq: Every day | ORAL | 0 refills | Status: DC

## 2018-09-20 MED ORDER — NITROGLYCERIN 0.4 MG SL SUBL: 0.4000 mg | SUBLINGUAL_TABLET | SUBLINGUAL | 12 refills | Status: DC | PRN

## 2018-09-20 MED ORDER — ATORVASTATIN CALCIUM 80 MG PO TABS: 80.0000 mg | ORAL_TABLET | Freq: Every day | ORAL | 0 refills | Status: DC

## 2018-09-20 NOTE — Progress Notes (Signed)
On call Cards MD [facility] paged. Pt has d/c orders per MD Arrien but pt must be cleared by Cards MD before discharge. On Call Cards MD stated he does not feel comfortable discharging a pt he does not know and pt will have to wait until tomorrow when his primary cardiologist comes back. On call floor coverage [facility] made aware. Will continue to monitor.

## 2018-09-20 NOTE — Progress Notes (Signed)
Patient c/o CP 7/10 to left side.  Nitro SL x 1 given with relief 0/10. BP stable 136/84 with HR 80. Patient denies pain at this time I will continue to monitor.

## 2018-09-20 NOTE — Progress Notes (Signed)
[facility] Progress Note  Subjective: in room, feet up, no distress. Having some chest pain  Vitals:   09/19/18 1527 09/20/18 0025 09/20/18 0216 09/20/18 0642  BP: 118/64 125/69 (!) 146/71 121/69  Pulse: 67     Resp: 15 16 18 18   Temp: 98.2 F (36.8 C) 98.5 F (36.9 C)  98.3 F (36.8 C)  TempSrc: Oral Oral  Oral  SpO2: 95% 95% 96% 100%  Weight:    90.2 kg  Height:        Inpatient medications: . aspirin EC  81 mg Oral Daily  . atorvastatin  80 mg Oral q1800  . calcitRIOL  0.75 mcg Oral Q T,Th,Sa-HD  . Chlorhexidine Gluconate Cloth  6 each Topical Q0600  . cinacalcet  60 mg Oral Q T,Th,Sa-HD  . clopidogrel  75 mg Oral Q breakfast  . divalproex  500 mg Oral BID  . folic acid  1 mg Oral Daily  . heparin  2,400 Units Dialysis Once in dialysis  . isosorbide mononitrate  60 mg Oral Daily  . lanthanum  2,000 mg Oral TID WC  . metoprolol succinate  25 mg Oral Daily  . multivitamin with minerals  1 tablet Oral Daily  . thiamine  100 mg Oral Daily   . sodium chloride 10 mL/hr at 09/18/18 1900  . sodium chloride    . sodium chloride    . sodium chloride    . sodium chloride     sodium chloride, sodium chloride, sodium chloride, sodium chloride, acetaminophen, alteplase, alteplase, heparin, heparin, lidocaine (PF), lidocaine (PF), lidocaine-prilocaine, lidocaine-prilocaine, nitroGLYCERIN, ondansetron (ZOFRAN) IV, pentafluoroprop-tetrafluoroeth, pentafluoroprop-tetrafluoroeth  Iron/TIBC/Ferritin/ %Sat    Component Value Date/Time   IRON 49 03/21/2018 1404   TIBC 207 (L) 03/21/2018 1404   FERRITIN 1,273 (H) 03/21/2018 1404   IRONPCTSAT 24 03/21/2018 1404    Exam: GEN appears older than stated age 89 EOMI PERRL, muddy sclerae NECK no JVD PULM normal WOB, clear bilaterally  CV regular today, soft systolic murmur ABD soft, mildly distended, NABS EXT no LE edema NEURO AAO x 3 ACCESS LUE AVF, aneurysmal, + T/B  Dialysis: TTS [facility]  4h 63min   2/2 bath   88.5kg  Hep 2400  L AVF - mircera 50 q 2wk last 11/14  - venofer 50/wk  - sensipar 90 tiw  - calcitriol 0.75 ug tiw     Impression/ Plan: 1. STEMI/ known severe CAD: recent admit not surg candidate or PCI candidate. Medical Rx.   2. ESRD on HD: HD TTS. HD today on holiday schedule 3. HTN - BP's good, 1-2kg up today 4. Anemia ckd - no esa yet 5. MBD ckd - cont meds    Kelly Splinter MD [facility] pager [phone]   09/20/2018, 8:46 AM   Recent Labs  Lab 09/17/18 0604  09/18/18 1815 09/19/18 1811  NA 135   < > 134* 135  K 4.4   < > 4.4 4.8  CL 94*   < > 97* 97*  CO2 28   < > 25 26  GLUCOSE 109*   < > 93 97  BUN 34*   < > 21 42*  CREATININE 8.07*   < > 6.58* 9.97*  CALCIUM 7.7*   < > 7.6* 8.1*  PHOS  --   --   --  4.0  ALBUMIN  --   --   --  3.2*  INR 1.05  --   --   --    < > =  values in this interval not displayed.   No results for input(s): AST, ALT, ALKPHOS, BILITOT, PROT in the last 168 hours. Recent Labs  Lab 09/19/18 0210 09/19/18 1811  WBC 9.0 8.3  HGB 9.7* 10.5*  HCT 29.7* 31.6*  MCV 99.3 100.0  PLT 256 225

## 2018-09-20 NOTE — Discharge Summary (Signed)
Physician Discharge Summary  Kenneth [name]:[id number] DOB: [birth year] DOA: 09/17/2018  PCP: [name], MD  Admit date: 09/17/2018 Discharge date: 09/20/2018  Admitted From: Home  Disposition:  Home   Recommendations for Outpatient Follow-up and new medication changes:  1. Follow up with Dr. Volanda Napoleon in 7 days.  2. Heart medications have been resumed, guideline directed therapy.   Home Health: No   Equipment/Devices: no    Discharge Condition: Stable   CODE STATUS:  DNR  Diet recommendation: Heart healthy and renal prudent   Brief/Interim Summary: 62 year old male who presented with left-sided chest pain.  He does have significant past medical history for diastolic heart failure, alcohol abuse, atrial fibrillation, coronary artery disease, and end-stage renal disease on hemodialysis. Reported a sudden onset of chest pressure, associated with dyspnea, radiating to his left arm.  Patient has been noncompliant with his medications and continued to drink alcohol.  On his initial physical examination blood pressure 125/68, heart rate 79, respiratory rate 18, temp 97.7, oxygen saturation 97%, his lungs were clear to auscultation bilaterally, heart S1-S2 present and rhythmic, abdomen soft nontender, no lower extremity edema.  Sodium 135, potassium 4.4, chloride 94, bicarb 28, glucose 109, BUN 34, creatinine 8.0, troponin I < 0.03, white count 9.4, hemoglobin 11.0, hematocrit 33.6, platelets 271.  Chest x-ray with congested hilar regions, no infiltrates, CT chest with faint groundglass bilaterally, EKG sinus rhythm, inferior lateral ST depressions with aVR, V1 and V2 ST elevations (new).   STEMI code was canceled due to poor candidate for PCI, in the past he was deemed not candidate for coronary bypass grafting.  He was admitted to the hospital with working diagnosis of ST elevation myocardial infarction.   1.  ST elevation myocardial infarction.  Patient is known to have severe  multivessel coronary artery disease including left main, ostial LAD, ostial and proximal circumflex, mid right coronary artery.  He has been deemed in the past not candidate for bypass grafting and not candidate for PCI (very high risk, will require unprotected left main intervention, atherectomy of the LAD and bifurcation stenting/history of noncompliance and risk of stent thrombosis ).    Patient was admitted to the stepdown unit, he was placed on continuous infusion of IV heparin and nitroglycerin, aggressive guideline-directed therapy with aspirin, clopidogrel and atorvastatin.  Beta-blockade with metoprolol and long-acting nitrate with isosorbide.  Further work-up with echocardiography showed preserved LV systolic function, ejection fraction 55 to 60%, no significant wall motion normalities.  Follow-up with cardiology as an outpatient.  2.  Endstage renal disease on hemodialysis.  Patient was dialyzed during this hospitalization with no major complications, continue Fosrenol, Sensipar and calcitriol for his metabolic bone disease.  3.  Hypertension.  Blood pressure remained well controlled with isosorbide and metoprolol.  4.  Alcohol abuse.  No signs of active withdrawal, patient received thiamine during his hospitalization.    Discharge Diagnoses:  Active Problems:   CKD (chronic kidney disease) stage 5, GFR less than 15 ml/min (HCC)   STEMI (ST elevation myocardial infarction) ([place])   Coronary artery disease involving native heart with angina pectoris ([place])   DNR (do not resuscitate) discussion   Palliative care by specialist   Goals of care, counseling/discussion    Discharge Instructions   Allergies as of 09/20/2018   No Known Allergies     Medication List    STOP taking these medications   calcitRIOL 0.25 MCG capsule Commonly known as:  ROCALTROL   carvedilol 12.5 MG tablet  Commonly known as:  COREG   divalproex 500 MG DR tablet Commonly known as:  DEPAKOTE   folic  acid 1 MG tablet Commonly known as:  FOLVITE   lanthanum 1000 MG chewable tablet Commonly known as:  FOSRENOL   thiamine 100 MG tablet     TAKE these medications   aspirin 81 MG EC tablet Take 1 tablet (81 mg total) by mouth daily.   atorvastatin 80 MG tablet Commonly known as:  LIPITOR Take 1 tablet (80 mg total) by mouth daily at 6 PM.   cinacalcet 30 MG tablet Commonly known as:  SENSIPAR Take 2 tablets (60 mg total) by mouth Every Tuesday,Thursday,and Saturday with dialysis. Start taking on:  09/21/2018   clopidogrel 75 MG tablet Commonly known as:  PLAVIX Take 1 tablet (75 mg total) by mouth daily with breakfast. What changed:  when to take this   isosorbide mononitrate 60 MG 24 hr tablet Commonly known as:  IMDUR Take 1 tablet (60 mg total) by mouth daily.   metoprolol succinate 25 MG 24 hr tablet Commonly known as:  TOPROL-XL Take 1 tablet (25 mg total) by mouth daily.   multivitamin with minerals Tabs tablet Take 1 tablet by mouth daily.   nitroGLYCERIN 0.4 MG SL tablet Commonly known as:  NITROSTAT Place 1 tablet (0.4 mg total) under the tongue every 5 (five) minutes x 3 doses as needed for chest pain. What changed:    when to take this  additional instructions       No Known Allergies  Consultations:  Nephrology   Cardiology    Procedures/Studies: Dg Chest 2 View  Result Date: 09/17/2018 CLINICAL DATA:  Chest pain since last night.  Shortness of breath. EXAM: CHEST - 2 VIEW COMPARISON:  09/01/2018 FINDINGS: The heart size and mediastinal contours are within normal limits. Both lungs are clear. The visualized skeletal structures are unremarkable. IMPRESSION: No active cardiopulmonary disease. Electronically Signed   By: Lucienne Capers M.D.   On: 09/17/2018 06:36   Ct Chest Without Contrast  Result Date: 09/17/2018 CLINICAL DATA:  Left chest pain radiating to both arms. History of coronary artery disease. Assessment of the degree of aortic  calcification. History of end-stage renal disease on dialysis. EXAM: CT CHEST WITHOUT CONTRAST TECHNIQUE: Multidetector CT imaging of the chest was performed following the standard protocol without IV contrast. COMPARISON:  Chest radiographs 09/17/2018 FINDINGS: Cardiovascular: There is diffuse calcific atherosclerosis throughout the thoracic aorta without aneurysm. Extensive coronary artery calcification is present including left main and three-vessel disease. The heart is normal in size. There is no pericardial effusion. Mediastinum/Nodes: No enlarged axillary, mediastinal, or hilar lymph nodes identified within limitations of noncontrast technique. Small calcified lymph nodes in the mediastinum and left hilum compatible with prior granulomatous disease. Unremarkable esophagus and thyroid. Lungs/Pleura: No pleural effusion or pneumothorax. Moderate centrilobular emphysema. Small calcified lung nodules bilaterally. Minimal dependent atelectasis in the right lower lobe. Upper Abdomen: Extensive atherosclerosis of the upper abdominal aorta and branch vessels including renal arteries. Severe bilateral renal atrophy with small low-density renal lesions bilaterally, likely cysts but incompletely evaluated. Musculoskeletal: No acute osseous abnormality or suspicious osseous lesion. IMPRESSION: 1. No acute abnormality identified in the chest. 2. Extensive aortic and coronary artery atherosclerosis. 3. Prior granulomatous disease. Aortic Atherosclerosis (ICD10-I70.0) and Emphysema (ICD10-J43.9). Electronically Signed   By: Logan Bores M.D.   On: 09/17/2018 09:39   Dg Chest Port 1 View  Result Date: 09/01/2018 CLINICAL DATA:  Left-sided chest pain. EXAM: PORTABLE  CHEST 1 VIEW COMPARISON:  08/11/2018 and 08/10/2018 FINDINGS: Heart size and pulmonary vascularity are normal the lungs are clear. Calcification and slight tortuosity of the thoracic aorta. No appreciable effusions. No significant bone abnormality. IMPRESSION:  No acute abnormality. Aortic Atherosclerosis (ICD10-I70.0). Electronically Signed   By: Lorriane Shire M.D.   On: 09/01/2018 11:05   Vas US Doppler Pre Cabg  Result Date: 09/03/2018 PREOPERATIVE VASCULAR EVALUATION  Indications: PRE CABG. Performing Technologist: Landry Mellow RDMS, RVT  Examination Guidelines: A complete evaluation includes B-mode imaging, spectral Doppler, color Doppler, and power Doppler as needed of all accessible portions of each vessel. Bilateral testing is considered an integral part of a complete examination. Limited examinations for reoccurring indications may be performed as noted.  Right Carotid Findings: +----------+--------+--------+--------+------------+--------+           PSV cm/sEDV cm/sStenosisDescribe    Comments +----------+--------+--------+--------+------------+--------+ CCA Prox  129     12              heterogenous         +----------+--------+--------+--------+------------+--------+ CCA Distal94      12                                   +----------+--------+--------+--------+------------+--------+ ICA Prox  25      8       1-39%   heterogenous         +----------+--------+--------+--------+------------+--------+ ECA       73      3                                    +----------+--------+--------+--------+------------+--------+ Portions of this table do not appear on this page. +----------+--------+-------+------------+------------+           PSV cm/sEDV cmsDescribe    Arm Pressure +----------+--------+-------+------------+------------+ Subclavian               Not assessed130          +----------+--------+-------+------------+------------+ +---------+--------+--------+--------------+ VertebralPSV cm/sEDV cm/sNot identified +---------+--------+--------+--------------+ technically difficult due to patient movement Left Carotid Findings: +----------+--------+--------+--------+------------+--------+           PSV cm/sEDV  cm/sStenosisDescribe    Comments +----------+--------+--------+--------+------------+--------+ CCA Prox  86      15              heterogenous         +----------+--------+--------+--------+------------+--------+ CCA Distal44      6                                    +----------+--------+--------+--------+------------+--------+ ICA Prox  26      12      1-39%   heterogenous         +----------+--------+--------+--------+------------+--------+ ICA Distal41      14                                   +----------+--------+--------+--------+------------+--------+ ECA       142     14                                   +----------+--------+--------+--------+------------+--------+ +----------+--------+--------+------------+------------+ SubclavianPSV cm/sEDV cm/sDescribe    Arm  Pressure +----------+--------+--------+------------+------------+                           Not assessed             +----------+--------+--------+------------+------------+ +---------+--------+--+--------+--+---------+ VertebralPSV cm/s35EDV cm/s12Antegrade +---------+--------+--+--------+--+---------+ technically difficult due to patient movement ABI Findings: +--------+------------------+-----+-----------+--------+ Right   Rt Pressure (mmHg)IndexWaveform   Comment  +--------+------------------+-----+-----------+--------+ Brachial130                    triphasic           +--------+------------------+-----+-----------+--------+ ATA                            not audible         +--------+------------------+-----+-----------+--------+ PTA     87                0.67 monophasic          +--------+------------------+-----+-----------+--------+ +--------+------------------+-----+-----------+------------------------------+ Left    Lt Pressure (mmHg)IndexWaveform   Comment                         +--------+------------------+-----+-----------+------------------------------+ Brachial                                  BP not taken due to AV fistula +--------+------------------+-----+-----------+------------------------------+ ATA                            not audible                               +--------+------------------+-----+-----------+------------------------------+ PTA     74                0.57 monophasic                                +--------+------------------+-----+-----------+------------------------------+  Right Doppler Findings: +--------+--------+-----+---------+--------+ Site    PressureIndexDoppler  Comments +--------+--------+-----+---------+--------+ Brachial130          triphasic         +--------+--------+-----+---------+--------+ Radial               triphasic         +--------+--------+-----+---------+--------+ Ulnar                triphasic         +--------+--------+-----+---------+--------+  Left Doppler Findings: +--------+--------+-----+-------+------------------------------+ Site    PressureIndexDopplerComments                       +--------+--------+-----+-------+------------------------------+ Brachial                    BP not taken due to AV fistula +--------+--------+-----+-------+------------------------------+ Radial                      not assessed                   +--------+--------+-----+-------+------------------------------+ Ulnar                       not assessed                   +--------+--------+-----+-------+------------------------------+  Summary:  Right Carotid: Velocities in the right ICA are consistent with a 1-39% stenosis. Left Carotid: Velocities in the left ICA are consistent with a 1-39% stenosis. Right ABI: Resting right ankle-brachial index indicates moderate right lower extremity arterial disease. Left ABI: Resting left ankle-brachial index indicates moderate left lower  extremity arterial disease. Right Upper Extremity: Doppler waveforms decrease >50% with right radial compression. Doppler waveforms remain within normal limits with right ulnar compression. Left Upper Extremity: Left not evaluated due to AV fistula.  Electronically signed by Monica Martinez MD on 09/03/2018 at 5:01:40 PM.    Final        Subjective: Patient is feeling better, no dyspnea, examined during HD.   Discharge Exam: Vitals:   09/20/18 0216 09/20/18 0642  BP: (!) 146/71 (P) 121/69  Pulse:    Resp: 18 (P) 18  Temp:  (P) 98.3 F (36.8 C)  SpO2: 96% (P) 100%   Vitals:   09/19/18 1527 09/20/18 0025 09/20/18 0216 09/20/18 0642  BP: 118/64 125/69 (!) 146/71 (P) 121/69  Pulse: 67     Resp: 15 16 18  (P) 18  Temp: 98.2 F (36.8 C) 98.5 F (36.9 C)  (P) 98.3 F (36.8 C)  TempSrc: Oral Oral  (P) Oral  SpO2: 95% 95% 96% (P) 100%  Weight:    (P) 90.2 kg  Height:        General: Not in pain or dyspnea.  Neurology: Awake and alert, non focal  E ENT: no pallor, no icterus, oral mucosa moist Cardiovascular: No JVD. S1-S2 present, rhythmic, no gallops, rubs, or murmurs. No lower extremity edema. Pulmonary: vesicular breath sounds bilaterally, adequate air movement, no wheezing, rhonchi or rales. Gastrointestinal. Abdomen with no organomegaly, non tender, no rebound or guarding Skin. No rashes Musculoskeletal: no joint deformities   The results of significant diagnostics from this hospitalization (including imaging, microbiology, ancillary and laboratory) are listed below for reference.     Microbiology: No results found for this or any previous visit (from the past 240 hour(s)).   Labs: BNP (last 3 results) Recent Labs    02/16/18 0648 05/11/18 1014 08/10/18 0336  BNP 1,594.1* 2,701.6* 545.6*   Basic Metabolic Panel: Recent Labs  Lab 09/17/18 0604 09/18/18 0237 09/18/18 1815 09/19/18 1811  NA 135 133* 134* 135  K 4.4 5.2* 4.4 4.8  CL 94* 92* 97* 97*  CO2 28  24 25 26   GLUCOSE 109* 77 93 97  BUN 34* 51* 21 42*  CREATININE 8.07* 10.71* 6.58* 9.97*  CALCIUM 7.7* 7.7* 7.6* 8.1*  PHOS  --   --   --  4.0   Liver Function Tests: Recent Labs  Lab 09/19/18 1811  ALBUMIN 3.2*   No results for input(s): LIPASE, AMYLASE in the last 168 hours. No results for input(s): AMMONIA in the last 168 hours. CBC: Recent Labs  Lab 09/17/18 0604 09/18/18 0237 09/19/18 0210 09/19/18 1811  WBC 9.4 9.5 9.0 8.3  HGB 11.0* 10.0* 9.7* 10.5*  HCT 33.6* 30.6* 29.7* 31.6*  MCV 100.9* 98.7 99.3 100.0  PLT 271 271 256 225   Cardiac Enzymes: Recent Labs  Lab 09/17/18 1054 09/17/18 1539 09/17/18 2153  TROPONINI <0.03 <0.03 <0.03   BNP: Invalid input(s): POCBNP CBG: No results for input(s): GLUCAP in the last 168 hours. D-Dimer No results for input(s): DDIMER in the last 72 hours. Hgb A1c No results for input(s): HGBA1C in the last 72 hours. Lipid Profile No results for input(s): CHOL, HDL, LDLCALC, TRIG, CHOLHDL, LDLDIRECT in the  last 72 hours. Thyroid function studies No results for input(s): TSH, T4TOTAL, T3FREE, THYROIDAB in the last 72 hours.  Invalid input(s): FREET3 Anemia work up No results for input(s): VITAMINB12, FOLATE, FERRITIN, TIBC, IRON, RETICCTPCT in the last 72 hours. Urinalysis    Component Value Date/Time   COLORURINE YELLOW 05/12/2016 0834   APPEARANCEUR CLOUDY (A) 05/12/2016 0834   LABSPEC 1.014 05/12/2016 0834   PHURINE 5.0 05/12/2016 0834   GLUCOSEU NEGATIVE 05/12/2016 0834   HGBUR MODERATE (A) 05/12/2016 0834   BILIRUBINUR NEGATIVE 05/12/2016 0834   KETONESUR NEGATIVE 05/12/2016 0834   PROTEINUR 100 (A) 05/12/2016 0834   UROBILINOGEN 0.2 12/09/2013 0013   NITRITE NEGATIVE 05/12/2016 0834   LEUKOCYTESUR NEGATIVE 05/12/2016 0834   Sepsis Labs Invalid input(s): PROCALCITONIN,  WBC,  LACTICIDVEN Microbiology No results found for this or any previous visit (from the past 240 hour(s)).   Time coordinating discharge:  45 minutes  SIGNED:   Tawni Millers, MD  [facility] 09/20/2018, 8:00 AM Pager [phone]  If 7PM-7AM, please contact night-coverage [email or web address] Password [email or web address]

## 2018-09-21 NOTE — Care Management Note (Addendum)
Case Management Note  Patient Details  Name: Kenneth [name] MRN: [id number] Date of Birth: [birth year]  Subjective/Objective:  Pt admitted with CP and SOB                  Action/Plan:  PTA independent from home with daughter.  Pt informed CM and confirmed he has active insurance.  Pt already active with [facility] - agency contacted and will resume services at discharge.  CM requested resumption orders from attending   Expected Discharge Date:  09/20/18               Expected Discharge Plan:  [place]  In-House Referral:     Discharge planning Services  CM Consult  Post Acute Care Choice:    Choice offered to:  Patient  DME Arranged:    DME Agency:     HH Arranged:  RN, PT(resumption orders) Central Agency:   [facility]  Status of Service:  Completed, signed off  If discussed at [employer], dates discussed:    Additional Comments:  Maryclare Labrador, RN 09/21/2018, 9:49 AM

## 2018-09-21 NOTE — Telephone Encounter (Signed)
Spoke with [place] regarding pt verbal orders approval, stated that they did not begin the service on the 09/19/18 as planned since pt was re admitted back to the hospital

## 2018-09-21 NOTE — Progress Notes (Signed)
Pt d/c home by taxi at [place] spoke to his daughter, Bonita Quin to check someone would receive him in to house. She has been made aware that pt's medications have been simplified to 8 medications . She states she will pick up the RX at [facility].

## 2018-09-21 NOTE — Plan of Care (Signed)
  Problem: Education: Goal: Understanding of cardiac disease, CV risk reduction, and recovery process will improve Outcome: Adequate for Discharge Goal: Understanding of medication regimen will improve Outcome: Adequate for Discharge Goal: Individualized Educational Video(s) Outcome: Adequate for Discharge   Problem: Cardiac: Goal: Ability to achieve and maintain adequate cardiopulmonary perfusion will improve Outcome: Adequate for Discharge Goal: Vascular access site(s) Level 0-1 will be maintained Outcome: Adequate for Discharge   Problem: Health Behavior/Discharge Planning: Goal: Ability to safely manage health-related needs after discharge will improve Outcome: Adequate for Discharge   Problem: Education: Goal: Knowledge of disease and its progression will improve Outcome: Adequate for Discharge Goal: Individualized Educational Video(s) Outcome: Adequate for Discharge   Problem: Fluid Volume: Goal: Compliance with measures to maintain balanced fluid volume will improve Outcome: Adequate for Discharge   Problem: Health Behavior/Discharge Planning: Goal: Ability to manage health-related needs will improve Outcome: Adequate for Discharge   Problem: Nutritional: Goal: Ability to make healthy dietary choices will improve Outcome: Adequate for Discharge   Problem: Clinical Measurements: Goal: Complications related to the disease process, condition or treatment will be avoided or minimized Outcome: Adequate for Discharge   Problem: Education: Goal: Knowledge of General Education information will improve Description Including pain rating scale, medication(s)/side effects and non-pharmacologic comfort measures Outcome: Adequate for Discharge   Problem: Health Behavior/Discharge Planning: Goal: Ability to manage health-related needs will improve Outcome: Adequate for Discharge   Problem: Clinical Measurements: Goal: Ability to maintain clinical measurements within normal  limits will improve Outcome: Adequate for Discharge Goal: Will remain free from infection Outcome: Adequate for Discharge Goal: Diagnostic test results will improve Outcome: Adequate for Discharge Goal: Respiratory complications will improve Outcome: Adequate for Discharge Goal: Cardiovascular complication will be avoided Outcome: Adequate for Discharge   Problem: Nutrition: Goal: Adequate nutrition will be maintained Outcome: Adequate for Discharge   Problem: Coping: Goal: Level of anxiety will decrease Outcome: Adequate for Discharge   Problem: Elimination: Goal: Will not experience complications related to bowel motility Outcome: Adequate for Discharge Goal: Will not experience complications related to urinary retention Outcome: Adequate for Discharge   Problem: Skin Integrity: Goal: Risk for impaired skin integrity will decrease Outcome: Adequate for Discharge

## 2018-09-21 NOTE — Progress Notes (Signed)
No major events overnight, no further chest pain, this morning patient has remained hemodynamically stable.  Plan to discharge patient home today, follow-up with hemodialysis schedule tomorrow.  Waiting for final cardiology recommendations.

## 2018-09-21 NOTE — Progress Notes (Addendum)
Progress Note  Patient Name: Kenneth [name] Date of Encounter: 09/21/2018  Primary Cardiologist: Peter [name], MD   Subjective   No chest pain or SOB this morning. No complaints of palpitations or LE edema. He understands that he needs to be compliant with his medications and outpatient follow-up before any further cardiac intervention would be considered.   Inpatient Medications    Scheduled Meds: . aspirin EC  81 mg Oral Daily  . atorvastatin  80 mg Oral q1800  . calcitRIOL  0.75 mcg Oral Q T,Th,Sa-HD  . Chlorhexidine Gluconate Cloth  6 each Topical Q0600  . cinacalcet  60 mg Oral Q T,Th,Sa-HD  . clopidogrel  75 mg Oral Q breakfast  . divalproex  500 mg Oral BID  . folic acid  1 mg Oral Daily  . isosorbide mononitrate  60 mg Oral Daily  . lanthanum  2,000 mg Oral TID WC  . metoprolol succinate  25 mg Oral Daily  . multivitamin with minerals  1 tablet Oral Daily  . thiamine  100 mg Oral Daily   Continuous Infusions: . sodium chloride 10 mL/hr at 09/18/18 1900  . sodium chloride    . sodium chloride     PRN Meds: sodium chloride, sodium chloride, acetaminophen, alteplase, heparin, lidocaine (PF), lidocaine-prilocaine, nitroGLYCERIN, ondansetron (ZOFRAN) IV, pentafluoroprop-tetrafluoroeth   Vital Signs    Vitals:   09/20/18 2009 09/21/18 0031 09/21/18 0503 09/21/18 0826  BP: 116/69 (!) 121/58 130/63 120/71  Pulse: 73 76 77 75  Resp: 15 15 16 17   Temp: 98.2 F (36.8 C) 98.5 F (36.9 C) 98.5 F (36.9 C) 98.3 F (36.8 C)  TempSrc: Oral Oral Oral Oral  SpO2: 99% 100% 98% 98%  Weight:   88.1 kg   Height:        Intake/Output Summary (Last 24 hours) at 09/21/2018 1059 Last data filed at 09/21/2018 0830 Gross per 24 hour  Intake 1072.5 ml  Output 2005 ml  Net -932.5 ml   Filed Weights   09/20/18 0642 09/20/18 1103 09/21/18 0503  Weight: 90.2 kg 88.1 kg 88.1 kg    Telemetry    Not currently on telemetry - Personally Reviewed  Physical Exam   GEN:  laying in bed in no acute distress.   Neck: No JVD, no carotid bruits Cardiac: RRR, no murmurs, rubs, or gallops.  Respiratory: Clear to auscultation bilaterally, no wheezes/ rales/ rhonchi GI: NABS, Soft, nontender, non-distended  MS: No edema; No deformity. Neuro:  Nonfocal, moving all extremities spontaneously Psych: Normal affect   Labs    Chemistry Recent Labs  Lab 09/18/18 0237 09/18/18 1815 09/19/18 1811  NA 133* 134* 135  K 5.2* 4.4 4.8  CL 92* 97* 97*  CO2 24 25 26   GLUCOSE 77 93 97  BUN 51* 21 42*  CREATININE 10.71* 6.58* 9.97*  CALCIUM 7.7* 7.6* 8.1*  ALBUMIN  --   --  3.2*  GFRNONAA 4* 8* 5*  GFRAA 5* 9* 6*  ANIONGAP 17* 12 12     Hematology Recent Labs  Lab 09/18/18 0237 09/19/18 0210 09/19/18 1811  WBC 9.5 9.0 8.3  RBC 3.10* 2.99* 3.16*  HGB 10.0* 9.7* 10.5*  HCT 30.6* 29.7* 31.6*  MCV 98.7 99.3 100.0  MCH 32.3 32.4 33.2  MCHC 32.7 32.7 33.2  RDW 14.8 14.9 15.0  PLT 271 256 225    Cardiac Enzymes Recent Labs  Lab 09/17/18 1054 09/17/18 1539 09/17/18 2153  TROPONINI <0.03 <0.03 <0.03    Recent Labs  Lab 09/17/18 0615  TROPIPOC 0.02     BNPNo results for input(s): BNP, PROBNP in the last 168 hours.   DDimer No results for input(s): DDIMER in the last 168 hours.   Radiology    No results found.  Cardiac Studies   Left heart catheterization 09/02/18: 1. Severe multivessel coronary artery disease involving the distal LMCA, ostial/proximal LAD, ostial and proximal LCx, and mid RCA. 2. Normal left ventricular filling pressure. 3. Scattered atherosclerotic calcification of the ascending aorta.  Recommendations: 1. Cardiac surgery consultation for CABG.  CT chest may be helpful to further assess degree of aortic calcification. 2. Initiate heparin infusion 8 hours after sheath removal. 3. Aggressive secondary prevention, including indefinite aspirin 81 mg daily and high-intensity statin therapy. 4. Plan for hemodialysis  tomorrow.   Echocardiogram 09/18/18: Study Conclusions  - Left ventricle: The cavity size was normal. Wall thickness was   increased in a pattern of mild LVH. Systolic function was normal.   The estimated ejection fraction was in the range of 55% to 60%.   Doppler parameters are consistent with abnormal left ventricular   relaxation (grade 1 diastolic dysfunction). The E/e&' ratio is <8,   suggesting normal LV filling pressure. - Aortic valve: Poorly visualized. Mildly calcified leaflets. There   was no stenosis. - Mitral valve: Calcified annulus. Mildly thickened leaflets .   There was trivial regurgitation. - Left atrium: The atrium was mildly dilated.  Impressions:  - Compared to a prior study on 09/01/2018, there are no significant   changes.   Patient Profile     62 y.o. male with severe multivessel coronary artery disease including left main, ostial LAD, ostial and proximal circumflex and mid right coronary artery.  Seen by cardiothoracic surgery and felt not to be a candidate for coronary artery bypass and graft.  Not a candidate for PCI given history of noncompliance with medications.  Assessment & Plan    1. Chest pain in patient with severe multivessel CAD: LHC 09/02/18 revealed severe multivessel CAD including left main, ostial LAD, ostal and proximal Cx, and mid RCA. He was seen by TCTS and turned down for CABG. Given longstanding history of non-compliance and ETOH abuse, he was felt to be a poor candidate for PCI. Recommendation was for medical management and demonstration of medication and follow-up compliance before high risk PCI would be considered. This was discussed with the patient today. - Continue aspirin, plavix, and statin - Continue metoprolol succinate and imdur  2. HTN: BP stable - Continue metoprolol succinate   3. ESRD on HD:  - Continue dialysis per nephrology  4. ETOH abuse: counseled on the importance of cessation - Continue to encourage  abstinence   [facility] will sign off.   Medication Recommendations:  Continue aspirin, plavix, atorvastatin, imdur, and metoprolol succinate Other recommendations (labs, testing, etc):  None Follow up as an outpatient:  Outpatient cardiology follow-up scheduled with Almyra Deforest, PA-C 09/29/18 at 8:30am - discharge AVS updated  For questions or updates, please contact [place] Please consult [email or web address] for contact info under Cardiology/STEMI.      Signed, Abigail Butts, PA-C  09/21/2018, 10:59 AM   [phone]  History and all data above reviewed.  Patient examined.  I agree with the findings as above.   The patient denies any chest pain and is ready to go home.  Unfortunately, the big issue is a very limited understanding and social barriers. The patient exam reveals COR:RRR  ,  Lungs: Clear  ,  Abd: Positive bowel sounds, no rebound no guarding, Ext no edema  .  All available labs, radiology testing, previous records reviewed. Agree with documented assessment and plan.   UNSTABLE ANGINA:  High risk for recurrent symptoms or acute event because of medical non adherence.  We have spoken with his daughter and nursing has reviewed the need to take the medicines and they should be able to pick up his beta blocker.  Not a candidate for CABG and too high risk for PCI.    Jeneen Rinks Aissatou Fronczak  12:58 PM  09/21/2018

## 2018-09-21 NOTE — Care Management Important Message (Signed)
Important Message  Patient Details  Name: Kenneth [name] MRN: [id number] Date of Birth: 1955-12-16   Medicare Important Message Given:  Yes    Barb Merino [place] 09/21/2018, 3:50 PM

## 2018-09-21 NOTE — Progress Notes (Signed)
CSW provided the nurse with taxi voucher for the patient.  Thurmond Butts, [place] Social Worker [phone]

## 2018-09-22 DIAGNOSIS — D631 Anemia in chronic kidney disease: Secondary | ICD-10-CM | POA: Diagnosis not present

## 2018-09-22 DIAGNOSIS — N186 End stage renal disease: Secondary | ICD-10-CM | POA: Diagnosis not present

## 2018-09-22 DIAGNOSIS — N2581 Secondary hyperparathyroidism of renal origin: Secondary | ICD-10-CM | POA: Diagnosis not present

## 2018-09-22 DIAGNOSIS — D509 Iron deficiency anemia, unspecified: Secondary | ICD-10-CM | POA: Diagnosis not present

## 2018-09-22 NOTE — Telephone Encounter (Signed)
FYI

## 2018-09-22 NOTE — Telephone Encounter (Signed)
Verbal orders received, Darlina Guys state that she starts seeing pt on 09/28/2018

## 2018-09-22 NOTE — Telephone Encounter (Signed)
Darlina Guys calling:  States they will go out for the first time on 12/03 to start services.

## 2018-09-25 DIAGNOSIS — D509 Iron deficiency anemia, unspecified: Secondary | ICD-10-CM | POA: Diagnosis not present

## 2018-09-25 DIAGNOSIS — N186 End stage renal disease: Secondary | ICD-10-CM | POA: Diagnosis not present

## 2018-09-25 DIAGNOSIS — D631 Anemia in chronic kidney disease: Secondary | ICD-10-CM | POA: Diagnosis not present

## 2018-09-25 DIAGNOSIS — N2581 Secondary hyperparathyroidism of renal origin: Secondary | ICD-10-CM | POA: Diagnosis not present

## 2018-09-26 DIAGNOSIS — N186 End stage renal disease: Secondary | ICD-10-CM | POA: Diagnosis not present

## 2018-09-26 DIAGNOSIS — Z992 Dependence on renal dialysis: Secondary | ICD-10-CM | POA: Diagnosis not present

## 2018-09-26 DIAGNOSIS — I129 Hypertensive chronic kidney disease with stage 1 through stage 4 chronic kidney disease, or unspecified chronic kidney disease: Secondary | ICD-10-CM | POA: Diagnosis not present

## 2018-09-28 ENCOUNTER — Telehealth: Payer: Self-pay | Admitting: Family Medicine

## 2018-09-28 DIAGNOSIS — D509 Iron deficiency anemia, unspecified: Secondary | ICD-10-CM | POA: Diagnosis not present

## 2018-09-28 DIAGNOSIS — N186 End stage renal disease: Secondary | ICD-10-CM | POA: Diagnosis not present

## 2018-09-28 DIAGNOSIS — D631 Anemia in chronic kidney disease: Secondary | ICD-10-CM | POA: Diagnosis not present

## 2018-09-28 DIAGNOSIS — N2581 Secondary hyperparathyroidism of renal origin: Secondary | ICD-10-CM | POA: Diagnosis not present

## 2018-09-28 NOTE — Telephone Encounter (Signed)
Copied from [place] [id number]. Topic: Quick Communication - Home Health Verbal Orders >> Sep 28, 2018  1:39 PM Lennox Solders wrote: Caller/Agency: Darlina Guys [facility]pn [facility]  Callback Number: [phone] . Darlina Guys is calling to let dr banks know skilled nursing will start tomorrow

## 2018-09-29 ENCOUNTER — Telehealth: Payer: Self-pay | Admitting: Family Medicine

## 2018-09-29 ENCOUNTER — Encounter: Payer: Self-pay | Admitting: Physician Assistant

## 2018-09-29 ENCOUNTER — Ambulatory Visit (INDEPENDENT_AMBULATORY_CARE_PROVIDER_SITE_OTHER): Payer: Medicare Other | Admitting: Physician Assistant

## 2018-09-29 VITALS — BP 136/90 | HR 67 | Ht 71.0 in | Wt 206.0 lb

## 2018-09-29 DIAGNOSIS — N186 End stage renal disease: Secondary | ICD-10-CM

## 2018-09-29 DIAGNOSIS — I208 Other forms of angina pectoris: Secondary | ICD-10-CM | POA: Diagnosis not present

## 2018-09-29 DIAGNOSIS — Z13220 Encounter for screening for lipoid disorders: Secondary | ICD-10-CM

## 2018-09-29 DIAGNOSIS — I10 Essential (primary) hypertension: Secondary | ICD-10-CM

## 2018-09-29 DIAGNOSIS — I25118 Atherosclerotic heart disease of native coronary artery with other forms of angina pectoris: Secondary | ICD-10-CM

## 2018-09-29 DIAGNOSIS — F1011 Alcohol abuse, in remission: Secondary | ICD-10-CM

## 2018-09-29 DIAGNOSIS — I480 Paroxysmal atrial fibrillation: Secondary | ICD-10-CM

## 2018-09-29 DIAGNOSIS — I4729 Other ventricular tachycardia: Secondary | ICD-10-CM

## 2018-09-29 DIAGNOSIS — Z992 Dependence on renal dialysis: Secondary | ICD-10-CM | POA: Diagnosis not present

## 2018-09-29 DIAGNOSIS — I472 Ventricular tachycardia: Secondary | ICD-10-CM | POA: Diagnosis not present

## 2018-09-29 MED ORDER — CLOPIDOGREL BISULFATE 75 MG PO TABS: 75.0000 mg | ORAL_TABLET | Freq: Every day | ORAL | 0 refills | Status: AC

## 2018-09-29 MED ORDER — ATORVASTATIN CALCIUM 80 MG PO TABS: 80.0000 mg | ORAL_TABLET | Freq: Every day | ORAL | 0 refills | Status: DC

## 2018-09-29 MED ORDER — ISOSORBIDE MONONITRATE ER 60 MG PO TB24: 60.0000 mg | ORAL_TABLET | Freq: Every day | ORAL | 0 refills | Status: DC

## 2018-09-29 NOTE — Telephone Encounter (Signed)
Copied from [place] [id number]. Topic: Quick Communication - Home Health Verbal Orders >> Sep 29, 2018  3:27 PM Percell Belt A wrote: Caller/Agency: April with [facility]  Callback Number: [phone] Requesting OT/PT/Skilled Nursing/Social Work: Need verbal for skilled nursing - med management , social work consult.  Pt does not have any meds because he stated he can not afford them  Frequency:  2 week 3 1 week 1 2 prn

## 2018-09-29 NOTE — Patient Instructions (Signed)
Medication Instructions:  Your physician recommends that you continue on your current medications as directed. Please refer to the Current Medication list given to you today.  If you need a refill on your cardiac medications before your next appointment, please call your pharmacy.   Lab work: Your physician recommends that you return for lab work in 2 MONTHS: LIPID, LFT  If you have labs (blood work) drawn today and your tests are completely normal, you will receive your results only by: Marland Kitchen MyChart Message (if you have MyChart) OR . A paper copy in the mail If you have any lab test that is abnormal or we need to change your treatment, we will call you to review the results.  Testing/Procedures: NONE  Follow-Up: At [facility], you and your health needs are our priority.  As part of our continuing mission to provide you with exceptional heart care, we have created designated Provider Care Teams.  These Care Teams include your primary Cardiologist (physician) and Advanced Practice Providers (APPs -  Physician Assistants and Nurse Practitioners) who all work together to provide you with the care you need, when you need it.  You will need a follow up appointment in [place], [place] AND 2-3 MONTHS WITH DR. [name].    Any Other Special Instructions Will Be Listed Below (If Applicable).

## 2018-09-29 NOTE — Progress Notes (Signed)
Cardiology Office Note    Date:  09/29/2018   ID:  Kenneth [name], DOB [birth year], MRN [id number]  PCP:  [name], MD  Cardiologist: Dr. [name]  Chief Complaint  Patient presents with  . Follow-up    seen for Dr. [name].     History of Present Illness:  Kenneth [name] is a 62 y.o. male with past medical history of diastolic heart failure, heavy EtOH use, atrial fibrillation and atrial flutter not a candidate for anticoagulation due to poor compliance, ESRD on HD TTS, hypertension and history of nonsustained VT.  His EF has been low in 25 to 30% range in 2017.  Repeat echocardiogram in February 2019 showed normalization of LVEF of 65 to 70%.  He was admitted in May 2019 with acute hypoxic respiratory failure due to pulmonary edema from noncompliance with hemodialysis.  While in the emergency room, he suffered a PEA arrest.  He received empiric treatment for hyperkalemia and shock for wide complex tachycardia.  He was also treated with amiodarone as well.  He achieved ROSC after 10 minutes of ACLS, following this he had A. fib with RVR and ultimately reverted to sinus rhythm.  Cardiology was not consulted at the time.  Echocardiogram obtained on 03/09/2018 demonstrated severe LVH, normal EF of 55 to 60%, moderately decreased RV systolic function and a thickened aortic and mitral valve.  There was some mention in the echocardiogram for consideration of evaluation for cardiac amyloidosis including PYP scan, this never took place.  Patient was admitted in early November 2019 with chest pain.  During the hospitalization, he had a significant T wave inversion in inferior and lateral leads concerning for progression of coronary artery disease.  He ended up having cardiac catheterization on 09/02/2018 which showed severe multivessel CAD involving distal left main, ostial to proximal LAD, ostial to proximal left circumflex, mid RCA.  CT surgery was consulted.  Patient was seen by Dr. Darcey Nora on  09/03/2018, given his comorbidities, although CABG likely will improve his cardiac issue but likely will result in further severe problems with multiorgan failure and dysfunction.  Therefore he was turned down for bypass surgery.  He was felt to be a very high risk patient for PCI and the risk was prohibitive especially in light of his compliance issue.  The procedure is also high risk as he would require unprotected left main intervention, atherectomy of LAD and bifurcation stenting.  He was eventually discharged on medical therapy.  However he returned to the hospital on 09/17/2018 with sudden onset of chest pain.  Initial EKG showed severe ST depression in inferolateral leads.  A code STEMI was called however subsequently canceled.  He was admitted for observation, cardiac enzyme was negative.  He was continue on aspirin, Plavix and statin.  Patient presents today for cardiology office visit.  Since since his recent discharge, he had one more episode of chest pain last week, he took a single nitroglycerin before the symptom went away after 5 minutes.  So far the only medication he is taking his aspirin, Toprol-XL and sublingual nitroglycerin, he has not been able to pick up the medication for Lipitor, Plavix and the Imdur due to cost reason.  We will see if he may qualify for orange card and able to [facility].  I did encourage the patient to ambulate at home but I do not expect him to do any strenuous activities given the significant coronary artery disease.  He will need to demonstrate compliance with medication and hemodialysis before we can consider the possibility of PCI in the future in case he does come back with a acute heart attack.  Overall he appears to be euvolemic.  He will need to return in about 2 months for fasting lipid panel and LFT.  Given the severity of his coronary artery disease involving all 3 blood vessels and the left main, I  recommend for him to return for office visit in 1 month to see me and 2 to 3 months to see Dr. [name].    Past Medical History:  Diagnosis Date  . Acute CHF ([place]) 01/2018  . Cardiomyopathy secondary    likely related to HTN heart disease; possibly ETOH related as well  . Chronic combined systolic and diastolic heart failure (HCC)    Echocardiogram 09/22/11: Moderate LVH, EF 51-88%, grade 3 diastolic dysfunction, mild MR, moderate to severe LAE, mild RVE, mild to moderate TR, small to moderate pericardial effusion  . ESRD (end stage renal disease) on dialysis ([place])    due to hypertensive nephrosclerosis; TTS; [address] (09/01/2018)  . History of alcohol abuse   . Hypertension     Past Surgical History:  Procedure Laterality Date  . AV FISTULA PLACEMENT Left 05/14/2016   Procedure: LEFT ARM BASILIC VEIN TRANSPOSITION;  Surgeon: Rosetta Posner, MD;  Location: [place];  Service: Vascular;  Laterality: Left;  . LEFT HEART CATH AND CORONARY ANGIOGRAPHY N/A 09/02/2018   Procedure: LEFT HEART CATH AND CORONARY ANGIOGRAPHY;  Surgeon: Nelva Bush, MD;  Location: [place] CV LAB;  Service: Cardiovascular;  Laterality: N/A;  . PERIPHERAL VASCULAR CATHETERIZATION N/A 05/13/2016   Procedure: Dialysis/Perma Catheter Insertion;  Surgeon: Serafina Mitchell, MD;  Location: [place] CV LAB;  Service: Cardiovascular;  Laterality: N/A;    Current Medications: Outpatient Medications Prior to Visit  Medication Sig Dispense Refill  . aspirin 81 MG EC tablet Take 1 tablet (81 mg total) by mouth daily. 30 tablet 0  . cinacalcet (SENSIPAR) 30 MG tablet Take 2 tablets (60 mg total) by mouth Every Tuesday,Thursday,and Saturday with dialysis. 24 tablet 0  . metoprolol succinate (TOPROL-XL) 25 MG 24 hr tablet Take 1 tablet (25 mg total) by mouth daily. 30 tablet 0  . Multiple Vitamin (MULTIVITAMIN WITH MINERALS) TABS tablet Take 1 tablet by mouth daily. 30 tablet 0  . nitroGLYCERIN (NITROSTAT) 0.4 MG SL tablet  Place 1 tablet (0.4 mg total) under the tongue every 5 (five) minutes x 3 doses as needed for chest pain. 20 tablet 12  . atorvastatin (LIPITOR) 80 MG tablet Take 1 tablet (80 mg total) by mouth daily at 6 PM. 30 tablet 0  . clopidogrel (PLAVIX) 75 MG tablet Take 1 tablet (75 mg total) by mouth daily with breakfast. 30 tablet 0  . isosorbide mononitrate (IMDUR) 60 MG 24 hr tablet Take 1 tablet (60 mg total) by mouth daily. 30 tablet 0   No facility-administered medications prior to visit.      Allergies:   Patient has no known allergies.   Social History   Socioeconomic History  . Marital status: Divorced    Spouse name: Not on file  . Number of children: Not on file  . Years of education: Not on file  . Highest education level: Not on file  Occupational History  . Not on file  Social Needs  . Financial resource strain: Not on file  . Food insecurity:    Worry: Not  on file    Inability: Not on file  . Transportation needs:    Medical: Not on file    Non-medical: Not on file  Tobacco Use  . Smoking status: Former Smoker    Packs/day: 2.00    Years: 48.00    Pack years: 96.00    Types: Cigars    Last attempt to quit: 10/27/2017    Years since quitting: 0.9  . Smokeless tobacco: Former Systems developer    Types: Chew  Substance and Sexual Activity  . Alcohol use: Yes    Frequency: Never    Comment: 09/01/2018 "nothing last 5 months"  . Drug use: No  . Sexual activity: Not Currently  Lifestyle  . Physical activity:    Days per week: Not on file    Minutes per session: Not on file  . Stress: Not on file  Relationships  . Social connections:    Talks on phone: Not on file    Gets together: Not on file    Attends religious service: Not on file    Active member of club or organization: Not on file    Attends meetings of clubs or organizations: Not on file    Relationship status: Not on file  Other Topics Concern  . Not on file  Social History Narrative  . Not on file      Family History:  The patient's family history includes Cirrhosis in his father; Emphysema in his mother.   ROS:   Please see the history of present illness.    ROS All other systems reviewed and are negative.   PHYSICAL EXAM:   VS:  BP 136/90   Pulse 67   Ht 5\' 11"  (1.803 m)   Wt 206 lb (93.4 kg)   BMI 28.73 kg/m    GEN: Well nourished, well developed, in no acute distress  HEENT: normal  Neck: no JVD, carotid bruits, or masses Cardiac: RRR; no murmurs, rubs, or gallops,no edema  Respiratory:  clear to auscultation bilaterally, normal work of breathing GI: soft, nontender, nondistended, + BS MS: no deformity or atrophy  Skin: warm and dry, no rash Neuro:  Alert and Oriented x 3, Strength and sensation are intact Psych: euthymic mood, full affect  Wt Readings from Last 3 Encounters:  09/29/18 206 lb (93.4 kg)  09/21/18 194 lb 3.6 oz (88.1 kg)  09/07/18 194 lb 10.7 oz (88.3 kg)      Studies/Labs Reviewed:   EKG:  EKG is ordered today.  The ekg ordered today demonstrates normal sinus rhythm with T wave inversion in inferolateral leads.  Recent Labs: 12/03/2017: TSH 0.825 07/20/2018: ALT 9 08/10/2018: B Natriuretic Peptide 678.7 09/06/2018: Magnesium 2.3 09/19/2018: BUN 42; Creatinine, Ser 9.97; Hemoglobin 10.5; Platelets 225; Potassium 4.8; Sodium 135   Lipid Panel    Component Value Date/Time   CHOL 204 (H) 09/17/2018 0604   TRIG 185 (H) 09/17/2018 0604   HDL 47 09/17/2018 0604   CHOLHDL 4.3 09/17/2018 0604   VLDL 37 09/17/2018 0604   LDLCALC 120 (H) 09/17/2018 0604    Additional studies/ records that were reviewed today include:   Cath 09/02/2018 Conclusions: 1. Severe multivessel coronary artery disease involving the distal LMCA, ostial/proximal LAD, ostial and proximal LCx, and mid RCA. 2. Normal left ventricular filling pressure. 3. Scattered atherosclerotic calcification of the ascending aorta.  Recommendations: 1. Cardiac surgery consultation for  CABG.  CT chest may be helpful to further assess degree of aortic calcification. 2. Initiate heparin infusion 8  hours after sheath removal. 3. Aggressive secondary prevention, including indefinite aspirin 81 mg daily and high-intensity statin therapy. 4. Plan for hemodialysis tomorrow.     Echo 09/18/2018 LV EF: 55% -   60% Study Conclusions  - Left ventricle: The cavity size was normal. Wall thickness was   increased in a pattern of mild LVH. Systolic function was normal.   The estimated ejection fraction was in the range of 55% to 60%.   Doppler parameters are consistent with abnormal left ventricular   relaxation (grade 1 diastolic dysfunction). The E/e&' ratio is <8,   suggesting normal LV filling pressure. - Aortic valve: Poorly visualized. Mildly calcified leaflets. There   was no stenosis. - Mitral valve: Calcified annulus. Mildly thickened leaflets .   There was trivial regurgitation. - Left atrium: The atrium was mildly dilated.  Impressions:  - Compared to a prior study on 09/01/2018, there are no significant   changes.   ASSESSMENT:    1. Coronary artery disease of native artery of native heart with stable angina pectoris ([place])   2. NSVT (nonsustained ventricular tachycardia) ([place])   3. Lipid screening   4. PAF (paroxysmal atrial fibrillation) ([place])   5. ESRD on hemodialysis ([place])   6. Essential hypertension   7. H/O ETOH abuse      PLAN:  In order of problems listed above:  1. CAD: Recent cardiac catheterization showed three-vessel disease.  Since discharge, patient was only able to obtain half of his cardiac medications.  We will check to see if patient qualify for any medication assistance program.   All of his medications are generic.  Since his discharge, he had one episode of chest pain last week which resolved with sublingual nitroglycerin.  He has not picked up his Imdur yet.  -He was evaluated by CT surgery during the recent hospitalization and was  turned down for CABG.  PCI is also quite high risk as well, however may still consider PCI in the future if he is able to demonstrate compliance.  2. PAF: He reportedly had a prior history of atrial fibrillation or atrial flutter, he was felt not to be a candidate for systemic anticoagulation.  Recent EKG showed normal sinus rhythm  3. End-stage renal disease on dialysis: Managed by nephrology service  4. NSVT: History of NSVT controlled on Toprol-XL.  5. Hypertension: Blood pressure well controlled  6. History of EtOH abuse: According to his daughter who stay with the patient, he has quit drinking.    Medication Adjustments/Labs and Tests Ordered: Current medicines are reviewed at length with the patient today.  Concerns regarding medicines are outlined above.  Medication changes, Labs and Tests ordered today are listed in the Patient Instructions below. Patient Instructions  Medication Instructions:  Your physician recommends that you continue on your current medications as directed. Please refer to the Current Medication list given to you today.  If you need a refill on your cardiac medications before your next appointment, please call your pharmacy.   Lab work: Your physician recommends that you return for lab work in 2 MONTHS: LIPID, LFT  If you have labs (blood work) drawn today and your tests are completely normal, you will receive your results only by: Marland Kitchen MyChart Message (if you have MyChart) OR . A paper copy in the mail If you have any lab test that is abnormal or we need to change your treatment, we will call you to review the results.  Testing/Procedures: NONE  Follow-Up: At [facility], you and your health needs are our priority.  As part of our continuing mission to provide you with exceptional heart care, we have created designated Provider Care Teams.  These Care Teams include your primary Cardiologist (physician) and Advanced Practice Providers (APPs -  Physician  Assistants and Nurse Practitioners) who all work together to provide you with the care you need, when you need it.  You will need a follow up appointment in [place], [place] AND 2-3 MONTHS WITH DR. [name].    Any Other Special Instructions Will Be Listed Below (If Applicable).      Hilbert Corrigan, [place]  09/29/2018 10:30 AM    [address], [address] [place], [place]  [address] Phone: [phone]; Fax: [phone]

## 2018-09-30 DIAGNOSIS — D631 Anemia in chronic kidney disease: Secondary | ICD-10-CM | POA: Diagnosis not present

## 2018-09-30 DIAGNOSIS — N186 End stage renal disease: Secondary | ICD-10-CM | POA: Diagnosis not present

## 2018-09-30 DIAGNOSIS — N2581 Secondary hyperparathyroidism of renal origin: Secondary | ICD-10-CM | POA: Diagnosis not present

## 2018-09-30 DIAGNOSIS — D509 Iron deficiency anemia, unspecified: Secondary | ICD-10-CM | POA: Diagnosis not present

## 2018-09-30 NOTE — Telephone Encounter (Signed)
Ok to give verbal orders?

## 2018-09-30 NOTE — Telephone Encounter (Signed)
ok 

## 2018-09-30 NOTE — Telephone Encounter (Signed)
Noted. FYI 

## 2018-10-01 NOTE — Telephone Encounter (Signed)
Called April left a detailed message with approved orders on pt therapy request

## 2018-10-02 DIAGNOSIS — D631 Anemia in chronic kidney disease: Secondary | ICD-10-CM | POA: Diagnosis not present

## 2018-10-02 DIAGNOSIS — D509 Iron deficiency anemia, unspecified: Secondary | ICD-10-CM | POA: Diagnosis not present

## 2018-10-02 DIAGNOSIS — N186 End stage renal disease: Secondary | ICD-10-CM | POA: Diagnosis not present

## 2018-10-02 DIAGNOSIS — N2581 Secondary hyperparathyroidism of renal origin: Secondary | ICD-10-CM | POA: Diagnosis not present

## 2018-10-05 DIAGNOSIS — N2581 Secondary hyperparathyroidism of renal origin: Secondary | ICD-10-CM | POA: Diagnosis not present

## 2018-10-05 DIAGNOSIS — D631 Anemia in chronic kidney disease: Secondary | ICD-10-CM | POA: Diagnosis not present

## 2018-10-05 DIAGNOSIS — N186 End stage renal disease: Secondary | ICD-10-CM | POA: Diagnosis not present

## 2018-10-05 DIAGNOSIS — D509 Iron deficiency anemia, unspecified: Secondary | ICD-10-CM | POA: Diagnosis not present

## 2018-10-07 DIAGNOSIS — D631 Anemia in chronic kidney disease: Secondary | ICD-10-CM | POA: Diagnosis not present

## 2018-10-07 DIAGNOSIS — N2581 Secondary hyperparathyroidism of renal origin: Secondary | ICD-10-CM | POA: Diagnosis not present

## 2018-10-07 DIAGNOSIS — N186 End stage renal disease: Secondary | ICD-10-CM | POA: Diagnosis not present

## 2018-10-07 DIAGNOSIS — D509 Iron deficiency anemia, unspecified: Secondary | ICD-10-CM | POA: Diagnosis not present

## 2018-10-09 DIAGNOSIS — N2581 Secondary hyperparathyroidism of renal origin: Secondary | ICD-10-CM | POA: Diagnosis not present

## 2018-10-09 DIAGNOSIS — N186 End stage renal disease: Secondary | ICD-10-CM | POA: Diagnosis not present

## 2018-10-09 DIAGNOSIS — D631 Anemia in chronic kidney disease: Secondary | ICD-10-CM | POA: Diagnosis not present

## 2018-10-09 DIAGNOSIS — D509 Iron deficiency anemia, unspecified: Secondary | ICD-10-CM | POA: Diagnosis not present

## 2018-10-12 DIAGNOSIS — N186 End stage renal disease: Secondary | ICD-10-CM | POA: Diagnosis not present

## 2018-10-12 DIAGNOSIS — D631 Anemia in chronic kidney disease: Secondary | ICD-10-CM | POA: Diagnosis not present

## 2018-10-12 DIAGNOSIS — N2581 Secondary hyperparathyroidism of renal origin: Secondary | ICD-10-CM | POA: Diagnosis not present

## 2018-10-12 DIAGNOSIS — D509 Iron deficiency anemia, unspecified: Secondary | ICD-10-CM | POA: Diagnosis not present

## 2018-10-14 DIAGNOSIS — N2581 Secondary hyperparathyroidism of renal origin: Secondary | ICD-10-CM | POA: Diagnosis not present

## 2018-10-14 DIAGNOSIS — N186 End stage renal disease: Secondary | ICD-10-CM | POA: Diagnosis not present

## 2018-10-14 DIAGNOSIS — D631 Anemia in chronic kidney disease: Secondary | ICD-10-CM | POA: Diagnosis not present

## 2018-10-14 DIAGNOSIS — D509 Iron deficiency anemia, unspecified: Secondary | ICD-10-CM | POA: Diagnosis not present

## 2018-10-16 DIAGNOSIS — N186 End stage renal disease: Secondary | ICD-10-CM | POA: Diagnosis not present

## 2018-10-16 DIAGNOSIS — D509 Iron deficiency anemia, unspecified: Secondary | ICD-10-CM | POA: Diagnosis not present

## 2018-10-16 DIAGNOSIS — N2581 Secondary hyperparathyroidism of renal origin: Secondary | ICD-10-CM | POA: Diagnosis not present

## 2018-10-16 DIAGNOSIS — D631 Anemia in chronic kidney disease: Secondary | ICD-10-CM | POA: Diagnosis not present

## 2018-10-18 DIAGNOSIS — N186 End stage renal disease: Secondary | ICD-10-CM | POA: Diagnosis not present

## 2018-10-18 DIAGNOSIS — D631 Anemia in chronic kidney disease: Secondary | ICD-10-CM | POA: Diagnosis not present

## 2018-10-18 DIAGNOSIS — N2581 Secondary hyperparathyroidism of renal origin: Secondary | ICD-10-CM | POA: Diagnosis not present

## 2018-10-18 DIAGNOSIS — D509 Iron deficiency anemia, unspecified: Secondary | ICD-10-CM | POA: Diagnosis not present

## 2018-10-21 DIAGNOSIS — N2581 Secondary hyperparathyroidism of renal origin: Secondary | ICD-10-CM | POA: Diagnosis not present

## 2018-10-21 DIAGNOSIS — D631 Anemia in chronic kidney disease: Secondary | ICD-10-CM | POA: Diagnosis not present

## 2018-10-21 DIAGNOSIS — D509 Iron deficiency anemia, unspecified: Secondary | ICD-10-CM | POA: Diagnosis not present

## 2018-10-21 DIAGNOSIS — N186 End stage renal disease: Secondary | ICD-10-CM | POA: Diagnosis not present

## 2018-10-23 DIAGNOSIS — N186 End stage renal disease: Secondary | ICD-10-CM | POA: Diagnosis not present

## 2018-10-23 DIAGNOSIS — D509 Iron deficiency anemia, unspecified: Secondary | ICD-10-CM | POA: Diagnosis not present

## 2018-10-23 DIAGNOSIS — D631 Anemia in chronic kidney disease: Secondary | ICD-10-CM | POA: Diagnosis not present

## 2018-10-23 DIAGNOSIS — N2581 Secondary hyperparathyroidism of renal origin: Secondary | ICD-10-CM | POA: Diagnosis not present

## 2018-10-27 DIAGNOSIS — I129 Hypertensive chronic kidney disease with stage 1 through stage 4 chronic kidney disease, or unspecified chronic kidney disease: Secondary | ICD-10-CM | POA: Diagnosis not present

## 2018-10-27 DIAGNOSIS — Z992 Dependence on renal dialysis: Secondary | ICD-10-CM | POA: Diagnosis not present

## 2018-10-27 DIAGNOSIS — N186 End stage renal disease: Secondary | ICD-10-CM | POA: Diagnosis not present

## 2018-10-28 DIAGNOSIS — N2581 Secondary hyperparathyroidism of renal origin: Secondary | ICD-10-CM | POA: Diagnosis not present

## 2018-10-28 DIAGNOSIS — D509 Iron deficiency anemia, unspecified: Secondary | ICD-10-CM | POA: Diagnosis not present

## 2018-10-28 DIAGNOSIS — N186 End stage renal disease: Secondary | ICD-10-CM | POA: Diagnosis not present

## 2018-10-30 DIAGNOSIS — N2581 Secondary hyperparathyroidism of renal origin: Secondary | ICD-10-CM | POA: Diagnosis not present

## 2018-10-30 DIAGNOSIS — N186 End stage renal disease: Secondary | ICD-10-CM | POA: Diagnosis not present

## 2018-10-30 DIAGNOSIS — D509 Iron deficiency anemia, unspecified: Secondary | ICD-10-CM | POA: Diagnosis not present

## 2018-11-02 ENCOUNTER — Ambulatory Visit: Payer: Medicare Other | Admitting: Physician Assistant

## 2018-11-02 DIAGNOSIS — N2581 Secondary hyperparathyroidism of renal origin: Secondary | ICD-10-CM | POA: Diagnosis not present

## 2018-11-02 DIAGNOSIS — D509 Iron deficiency anemia, unspecified: Secondary | ICD-10-CM | POA: Diagnosis not present

## 2018-11-02 DIAGNOSIS — N186 End stage renal disease: Secondary | ICD-10-CM | POA: Diagnosis not present

## 2018-11-04 DIAGNOSIS — N186 End stage renal disease: Secondary | ICD-10-CM | POA: Diagnosis not present

## 2018-11-04 DIAGNOSIS — D509 Iron deficiency anemia, unspecified: Secondary | ICD-10-CM | POA: Diagnosis not present

## 2018-11-04 DIAGNOSIS — N2581 Secondary hyperparathyroidism of renal origin: Secondary | ICD-10-CM | POA: Diagnosis not present

## 2018-11-06 DIAGNOSIS — D509 Iron deficiency anemia, unspecified: Secondary | ICD-10-CM | POA: Diagnosis not present

## 2018-11-06 DIAGNOSIS — N2581 Secondary hyperparathyroidism of renal origin: Secondary | ICD-10-CM | POA: Diagnosis not present

## 2018-11-06 DIAGNOSIS — N186 End stage renal disease: Secondary | ICD-10-CM | POA: Diagnosis not present

## 2018-11-09 DIAGNOSIS — N186 End stage renal disease: Secondary | ICD-10-CM | POA: Diagnosis not present

## 2018-11-09 DIAGNOSIS — N2581 Secondary hyperparathyroidism of renal origin: Secondary | ICD-10-CM | POA: Diagnosis not present

## 2018-11-09 DIAGNOSIS — D509 Iron deficiency anemia, unspecified: Secondary | ICD-10-CM | POA: Diagnosis not present

## 2018-11-11 DIAGNOSIS — N2581 Secondary hyperparathyroidism of renal origin: Secondary | ICD-10-CM | POA: Diagnosis not present

## 2018-11-11 DIAGNOSIS — D509 Iron deficiency anemia, unspecified: Secondary | ICD-10-CM | POA: Diagnosis not present

## 2018-11-11 DIAGNOSIS — N186 End stage renal disease: Secondary | ICD-10-CM | POA: Diagnosis not present

## 2018-11-13 DIAGNOSIS — N2581 Secondary hyperparathyroidism of renal origin: Secondary | ICD-10-CM | POA: Diagnosis not present

## 2018-11-13 DIAGNOSIS — N186 End stage renal disease: Secondary | ICD-10-CM | POA: Diagnosis not present

## 2018-11-13 DIAGNOSIS — D509 Iron deficiency anemia, unspecified: Secondary | ICD-10-CM | POA: Diagnosis not present

## 2018-11-16 DIAGNOSIS — N2581 Secondary hyperparathyroidism of renal origin: Secondary | ICD-10-CM | POA: Diagnosis not present

## 2018-11-16 DIAGNOSIS — N186 End stage renal disease: Secondary | ICD-10-CM | POA: Diagnosis not present

## 2018-11-16 DIAGNOSIS — D509 Iron deficiency anemia, unspecified: Secondary | ICD-10-CM | POA: Diagnosis not present

## 2018-11-18 DIAGNOSIS — N2581 Secondary hyperparathyroidism of renal origin: Secondary | ICD-10-CM | POA: Diagnosis not present

## 2018-11-18 DIAGNOSIS — D509 Iron deficiency anemia, unspecified: Secondary | ICD-10-CM | POA: Diagnosis not present

## 2018-11-18 DIAGNOSIS — N186 End stage renal disease: Secondary | ICD-10-CM | POA: Diagnosis not present

## 2018-11-19 ENCOUNTER — Ambulatory Visit (INDEPENDENT_AMBULATORY_CARE_PROVIDER_SITE_OTHER): Payer: Medicare Other | Admitting: Physician Assistant

## 2018-11-19 ENCOUNTER — Encounter: Payer: Self-pay | Admitting: Physician Assistant

## 2018-11-19 VITALS — BP 111/68 | HR 77 | Ht 71.0 in | Wt 203.6 lb

## 2018-11-19 DIAGNOSIS — N186 End stage renal disease: Secondary | ICD-10-CM | POA: Diagnosis not present

## 2018-11-19 DIAGNOSIS — I472 Ventricular tachycardia: Secondary | ICD-10-CM | POA: Diagnosis not present

## 2018-11-19 DIAGNOSIS — I4729 Other ventricular tachycardia: Secondary | ICD-10-CM

## 2018-11-19 DIAGNOSIS — I5032 Chronic diastolic (congestive) heart failure: Secondary | ICD-10-CM | POA: Diagnosis not present

## 2018-11-19 DIAGNOSIS — Z13220 Encounter for screening for lipoid disorders: Secondary | ICD-10-CM | POA: Diagnosis not present

## 2018-11-19 DIAGNOSIS — I25119 Atherosclerotic heart disease of native coronary artery with unspecified angina pectoris: Secondary | ICD-10-CM | POA: Diagnosis not present

## 2018-11-19 LAB — HEPATIC FUNCTION PANEL
ALT: 8 IU/L (ref 0–44)
AST: 17 IU/L (ref 0–40)
Albumin: 4.4 g/dL (ref 3.8–4.8)
Alkaline Phosphatase: 109 IU/L (ref 39–117)
BILIRUBIN TOTAL: 0.5 mg/dL (ref 0.0–1.2)
BILIRUBIN, DIRECT: 0.15 mg/dL (ref 0.00–0.40)
Total Protein: 8.4 g/dL (ref 6.0–8.5)

## 2018-11-19 LAB — LIPID PANEL
CHOL/HDL RATIO: 3.9 ratio (ref 0.0–5.0)
CHOLESTEROL TOTAL: 193 mg/dL (ref 100–199)
HDL: 50 mg/dL (ref >39)
LDL CALC: 119 mg/dL — AB (ref 0–99)
Triglycerides: 120 mg/dL (ref 0–149)
VLDL Cholesterol Cal: 24 mg/dL (ref 5–40)

## 2018-11-19 MED ORDER — NITROGLYCERIN 0.4 MG SL SUBL: 0.4000 mg | SUBLINGUAL_TABLET | SUBLINGUAL | 3 refills | Status: DC | PRN

## 2018-11-19 MED ORDER — CLOPIDOGREL BISULFATE 75 MG PO TABS: 75.0000 mg | ORAL_TABLET | Freq: Every day | ORAL | 3 refills | Status: DC

## 2018-11-19 MED ORDER — PANTOPRAZOLE SODIUM 40 MG PO TBEC: 40.0000 mg | DELAYED_RELEASE_TABLET | Freq: Every day | ORAL | 3 refills | Status: DC

## 2018-11-19 MED ORDER — ISOSORBIDE MONONITRATE ER 60 MG PO TB24: 60.0000 mg | ORAL_TABLET | Freq: Every evening | ORAL | 3 refills | Status: DC

## 2018-11-19 NOTE — Patient Instructions (Signed)
Medication Instructions:  TAKE ISOSORBIDE IN THE EVENING TAKE METOPROLOL IN THE AM  RE-START PLAVIX 75MG -4 TAB(300MG ) FOR THE 1ST DOSE THEN 75MG  DAILY  START PROTONIX 40MG  DAILY  If you need a refill on your cardiac medications before your next appointment, please call your pharmacy.  Labwork: LIPID AND LFT TODAY HERE IN OUR OFFICE AT [facility]   Take the provided lab slips with you to the lab for your blood draw.   When you have your labs (blood work) drawn today and your tests are completely normal, you will receive your results only by MyChart Message (if you have MyChart) -OR-  A paper copy in the mail.  If you have any lab test that is abnormal or we need to change your treatment, we will call you to review these results.  Follow-Up: You will need a follow up appointment in FEB 2020.  You may see Peter [name], MD or one of the following Advanced Practice Providers on your designated Care Team:  Almyra Deforest, [place]   Fabian Sharp, PA-C   At [facility], you and your health needs are our priority.  As part of our continuing mission to provide you with exceptional heart care, we have created designated Provider Care Teams.  These Care Teams include your primary Cardiologist (physician) and Advanced Practice Providers (APPs -  Physician Assistants and Nurse Practitioners) who all work together to provide you with the care you need, when you need it.  Thank you for choosing [facility] at [facility]!!

## 2018-11-19 NOTE — Progress Notes (Signed)
Cardiology Office Note    Date:  11/21/2018   ID:  Kenneth [name], Kenneth [name] 03-Apr-1956, MRN [id number]  PCP:  [name], MD  Cardiologist:  Dr. [name]  Chief Complaint  Patient presents with  . Follow-up    seen for Dr. [name].     History of Present Illness:  Kenneth [name] is a 63 y.o. male with past medical history of diastolic heart failure, heavy EtOH use, atrial fibrillation and atrial flutter not a candidate for anticoagulation due to poor compliance, ESRD on HD TTS, hypertension and history of nonsustained VT.  His EF has been low in 25 to 30% range in 2017.  Repeat echocardiogram in February 2019 showed normalization of LVEF of 65 to 70%.  He was admitted in May 2019 with acute hypoxic respiratory failure due to pulmonary edema from noncompliance with hemodialysis.  While in the emergency room, he suffered a PEA arrest.  He received empiric treatment for hyperkalemia and shock for wide complex tachycardia.  He was also treated with amiodarone as well.  He achieved ROSC after 10 minutes of ACLS, following this he had A. fib with RVR and ultimately reverted to sinus rhythm.  Cardiology was not consulted at the time.  Echocardiogram obtained on 03/09/2018 demonstrated severe LVH, normal EF of 55 to 60%, moderately decreased RV systolic function and a thickened aortic and mitral valve.  There was some mention in the echocardiogram for consideration of evaluation for cardiac amyloidosis including PYP scan, this never took place.  Patient was admitted in early November 2019 with chest pain.  During the hospitalization, he had a significant T wave inversion in inferior and lateral leads concerning for progression of coronary artery disease.  He ended up having cardiac catheterization on 09/02/2018 which showed severe multivessel CAD involving distal left main, ostial to proximal LAD, ostial to proximal left circumflex, mid RCA.  CT surgery was consulted.  Patient was seen by Dr. Prescott Gum on  09/03/2018, given his comorbidities, although CABG likely will improve his cardiac issue but likely will result in further severe problems with multiorgan failure and dysfunction.  Therefore he was turned down for bypass surgery.  He was felt to be a very high risk patient for PCI and the risk was prohibitive especially in light of his compliance issue.  The procedure is also high risk as he would require unprotected left main intervention, atherectomy of LAD and bifurcation stenting.  He was eventually discharged on medical therapy.  However he returned to the hospital on 09/17/2018 with sudden onset of chest pain.  Initial EKG showed severe ST depression in inferolateral leads.  A code STEMI was called however subsequently canceled.  He was admitted for observation, cardiac enzyme was negative.  He was continue on aspirin, Plavix and statin.  When I saw the patient in December, the only medication he was taking was aspirin, Toprol-XL and sublingual nitroglycerin.  He did have to take 1 sublingual nitroglycerin due to recurrence of chest pain.  He was not able to obtain Lipitor, Plavix and Imdur due to cost reason.  He presents today for follow-up.  He is able to afford his medication at this point.  However his Plavix was stopped a few days ago as his prescription was ran out.  I will restart his Plavix after loading dose.  He is still having occasional chest pain about once a week.  This is been suppressed by sublingual nitroglycerin.  I will give him a refill for  his sublingual nitroglycerin this point.  He is aware that if the chest discomfort lasted longer or become more frequent, he will need to let us know.  If he take 3 consecutive nitroglycerin, he will also need to seek urgent medical attention at the local ED.  He does admit to have some heartburn symptoms as well which is brought on by spicy food and tacos.  I will give him a prescription for Protonix 40 mg daily.   Past Medical History:  Diagnosis  Date  . Acute CHF ([place]) 01/2018  . Cardiomyopathy secondary    likely related to HTN heart disease; possibly ETOH related as well  . Chronic combined systolic and diastolic heart failure (HCC)    Echocardiogram 09/22/11: Moderate LVH, EF 95-28%, grade 3 diastolic dysfunction, mild MR, moderate to severe LAE, mild RVE, mild to moderate TR, small to moderate pericardial effusion  . ESRD (end stage renal disease) on dialysis ([place])    due to hypertensive nephrosclerosis; TTS; [address] (09/01/2018)  . History of alcohol abuse   . Hypertension     Past Surgical History:  Procedure Laterality Date  . AV FISTULA PLACEMENT Left 05/14/2016   Procedure: LEFT ARM BASILIC VEIN TRANSPOSITION;  Surgeon: Rosetta Posner, MD;  Location: [place];  Service: Vascular;  Laterality: Left;  . LEFT HEART CATH AND CORONARY ANGIOGRAPHY N/A 09/02/2018   Procedure: LEFT HEART CATH AND CORONARY ANGIOGRAPHY;  Surgeon: Nelva Bush, MD;  Location: [place] [facility];  Service: Cardiovascular;  Laterality: N/A;  . PERIPHERAL VASCULAR CATHETERIZATION N/A 05/13/2016   Procedure: Dialysis/Perma Catheter Insertion;  Surgeon: Serafina Mitchell, MD;  Location: [place] [facility];  Service: Cardiovascular;  Laterality: N/A;    Current Medications: Outpatient Medications Prior to Visit  Medication Sig Dispense Refill  . aspirin 81 MG EC tablet Take 1 tablet (81 mg total) by mouth daily. 30 tablet 0  . atorvastatin (LIPITOR) 80 MG tablet Take 1 tablet (80 mg total) by mouth daily at 6 PM. 30 tablet 0  . metoprolol succinate (TOPROL-XL) 25 MG 24 hr tablet Take 1 tablet (25 mg total) by mouth daily. 30 tablet 0  . isosorbide mononitrate (IMDUR) 60 MG 24 hr tablet Take 1 tablet (60 mg total) by mouth daily. 30 tablet 0  . nitroGLYCERIN (NITROSTAT) 0.4 MG SL tablet Place 1 tablet (0.4 mg total) under the tongue every 5 (five) minutes x 3 doses as needed for chest pain. 20 tablet 12   No facility-administered medications prior to visit.       Allergies:   Patient has no known allergies.   Social History   Socioeconomic History  . Marital status: Divorced    Spouse name: Not on file  . Number of children: Not on file  . Years of education: Not on file  . Highest education level: Not on file  Occupational History  . Not on file  Social Needs  . Financial resource strain: Not on file  . Food insecurity:    Worry: Not on file    Inability: Not on file  . Transportation needs:    Medical: Not on file    Non-medical: Not on file  Tobacco Use  . Smoking status: Former Smoker    Packs/day: 2.00    Years: 48.00    Pack years: 96.00    Types: Cigars    Last attempt to quit: 10/27/2017    Years since quitting: 1.0  . Smokeless tobacco: Former Systems developer  Types: Chew  Substance and Sexual Activity  . Alcohol use: Yes    Frequency: Never    Comment: 09/01/2018 "nothing last 5 months"  . Drug use: No  . Sexual activity: Not Currently  Lifestyle  . Physical activity:    Days per week: Not on file    Minutes per session: Not on file  . Stress: Not on file  Relationships  . Social connections:    Talks on phone: Not on file    Gets together: Not on file    Attends religious service: Not on file    Active member of club or organization: Not on file    Attends meetings of clubs or organizations: Not on file    Relationship status: Not on file  Other Topics Concern  . Not on file  Social History Narrative  . Not on file     Family History:  The patient's family history includes Cirrhosis in his father; Emphysema in his mother.   ROS:   Please see the history of present illness.    ROS All other systems reviewed and are negative.   PHYSICAL EXAM:   VS:  BP 111/68   Pulse 77   Ht 5\' 11"  (1.803 m)   Wt 203 lb 9.6 oz (92.4 kg)   BMI 28.40 kg/m    GEN: Well nourished, well developed, in no acute distress  HEENT: normal  Neck: no JVD, carotid bruits, or masses Cardiac: RRR; no murmurs, rubs, or gallops,no  edema  Respiratory:  clear to auscultation bilaterally, normal work of breathing GI: soft, nontender, nondistended, + BS MS: no deformity or atrophy  Skin: warm and dry, no rash Neuro:  Alert and Oriented x 3, Strength and sensation are intact Psych: euthymic mood, full affect  Wt Readings from Last 3 Encounters:  11/19/18 203 lb 9.6 oz (92.4 kg)  09/29/18 206 lb (93.4 kg)  09/21/18 194 lb 3.6 oz (88.1 kg)      Studies/Labs Reviewed:   EKG:  EKG is not ordered today.    Recent Labs: 12/03/2017: TSH 0.825 08/10/2018: B Natriuretic Peptide 678.7 09/06/2018: Magnesium 2.3 09/19/2018: BUN 42; Creatinine, Ser 9.97; Hemoglobin 10.5; Platelets 225; Potassium 4.8; Sodium 135 11/19/2018: ALT 8   Lipid Panel    Component Value Date/Time   CHOL 193 11/19/2018 0959   TRIG 120 11/19/2018 0959   HDL 50 11/19/2018 0959   CHOLHDL 3.9 11/19/2018 0959   CHOLHDL 4.3 09/17/2018 0604   VLDL 37 09/17/2018 0604   LDLCALC 119 (H) 11/19/2018 0959    Additional studies/ records that were reviewed today include:   Cath 09/02/2018 Conclusions: 1. Severe multivessel coronary artery disease involving the distal LMCA, ostial/proximal LAD, ostial and proximal LCx, and mid RCA. 2. Normal left ventricular filling pressure. 3. Scattered atherosclerotic calcification of the ascending aorta.  Recommendations: 1. Cardiac surgery consultation for CABG.  CT chest may be helpful to further assess degree of aortic calcification. 2. Initiate heparin infusion 8 hours after sheath removal. 3. Aggressive secondary prevention, including indefinite aspirin 81 mg daily and high-intensity statin therapy. 4. Plan for hemodialysis tomorrow.   Echo 09/18/2018 LV EF: 55% -   60% Study Conclusions  - Left ventricle: The cavity size was normal. Wall thickness was   increased in a pattern of mild LVH. Systolic function was normal.   The estimated ejection fraction was in the range of 55% to 60%.   Doppler  parameters are consistent with abnormal left ventricular   relaxation (  grade 1 diastolic dysfunction). The E/e&' ratio is <8,   suggesting normal LV filling pressure. - Aortic valve: Poorly visualized. Mildly calcified leaflets. There   was no stenosis. - Mitral valve: Calcified annulus. Mildly thickened leaflets .   There was trivial regurgitation. - Left atrium: The atrium was mildly dilated.  Impressions:  - Compared to a prior study on 09/01/2018, there are no significant   changes.    ASSESSMENT:    1. Coronary artery disease involving native coronary artery of native heart with angina pectoris ([place])   2. Chronic diastolic heart failure ([place])   3. ESRD (end stage renal disease) ([place])   4. NSVT (nonsustained ventricular tachycardia) (HCC)      PLAN:  In order of problems listed above:  4. CAD: Severe multivessel disease noted on last cardiac catheterization in November.  Patient would not be a candidate for bypass surgery.  He is being treated medically.  He continued to have intermittent chest discomfort about once a week.  His blood pressure does not allow me to further uptitrate antianginal medication.  Continue Imdur 60 mg daily and add Toprol-XL.  He ran out of Plavix, I will refill Plavix.  He does have some burning sensation in the chest, this is more consistent with acid reflux symptom, I will start him on a course of Protonix 40 mg daily  5. Chronic diastolic heart failure: Appears to be euvolemic on physical exam.  Fluid level managed using dialysis  6. End-stage renal disease on hemodialysis: Blood pressure stable  7. History of nonsustained VT: No recent recurrence of dizziness.  Despite three-vessel disease, his recent echocardiogram showed a normal ejection fraction.    Medication Adjustments/Labs and Tests Ordered: Current medicines are reviewed at length with the patient today.  Concerns regarding medicines are outlined above.  Medication changes, Labs and  Tests ordered today are listed in the Patient Instructions below. Patient Instructions  Medication Instructions:  TAKE ISOSORBIDE IN THE EVENING TAKE METOPROLOL IN THE AM  RE-START PLAVIX 75MG -4 TAB(300MG ) FOR THE 1ST DOSE THEN 75MG  DAILY  START PROTONIX 40MG  DAILY  If you need a refill on your cardiac medications before your next appointment, please call your pharmacy.  Labwork: LIPID AND LFT TODAY HERE IN OUR OFFICE AT [facility]   Take the provided lab slips with you to the lab for your blood draw.   When you have your labs (blood work) drawn today and your tests are completely normal, you will receive your results only by MyChart Message (if you have MyChart) -OR-  A paper copy in the mail.  If you have any lab test that is abnormal or we need to change your treatment, we will call you to review these results.  Follow-Up: You will need a follow up appointment in FEB 2020.  You may see Peter [name], MD or one of the following Advanced Practice Providers on your designated Care Team:  Almyra Deforest, [place]   Fabian Sharp, PA-C   At [facility], you and your health needs are our priority.  As part of our continuing mission to provide you with exceptional heart care, we have created designated Provider Care Teams.  These Care Teams include your primary Cardiologist (physician) and Advanced Practice Providers (APPs -  Physician Assistants and Nurse Practitioners) who all work together to provide you with the care you need, when you need it.  Thank you for choosing [facility] at [facility], Almyra Deforest,  PA  11/21/2018 11:22 PM    [facility] [address], [place], [place]  [address] Phone: [phone]; Fax: [phone]

## 2018-11-20 DIAGNOSIS — N2581 Secondary hyperparathyroidism of renal origin: Secondary | ICD-10-CM | POA: Diagnosis not present

## 2018-11-20 DIAGNOSIS — N186 End stage renal disease: Secondary | ICD-10-CM | POA: Diagnosis not present

## 2018-11-20 DIAGNOSIS — D509 Iron deficiency anemia, unspecified: Secondary | ICD-10-CM | POA: Diagnosis not present

## 2018-11-21 ENCOUNTER — Encounter: Payer: Self-pay | Admitting: Physician Assistant

## 2018-11-23 DIAGNOSIS — N2581 Secondary hyperparathyroidism of renal origin: Secondary | ICD-10-CM | POA: Diagnosis not present

## 2018-11-23 DIAGNOSIS — N186 End stage renal disease: Secondary | ICD-10-CM | POA: Diagnosis not present

## 2018-11-23 DIAGNOSIS — D509 Iron deficiency anemia, unspecified: Secondary | ICD-10-CM | POA: Diagnosis not present

## 2018-11-25 ENCOUNTER — Encounter: Payer: Self-pay | Admitting: Cardiology

## 2018-11-25 DIAGNOSIS — N2581 Secondary hyperparathyroidism of renal origin: Secondary | ICD-10-CM | POA: Diagnosis not present

## 2018-11-25 DIAGNOSIS — D509 Iron deficiency anemia, unspecified: Secondary | ICD-10-CM | POA: Diagnosis not present

## 2018-11-25 DIAGNOSIS — N186 End stage renal disease: Secondary | ICD-10-CM | POA: Diagnosis not present

## 2018-11-27 DIAGNOSIS — N186 End stage renal disease: Secondary | ICD-10-CM | POA: Diagnosis not present

## 2018-11-27 DIAGNOSIS — N2581 Secondary hyperparathyroidism of renal origin: Secondary | ICD-10-CM | POA: Diagnosis not present

## 2018-11-27 DIAGNOSIS — I129 Hypertensive chronic kidney disease with stage 1 through stage 4 chronic kidney disease, or unspecified chronic kidney disease: Secondary | ICD-10-CM | POA: Diagnosis not present

## 2018-11-27 DIAGNOSIS — Z992 Dependence on renal dialysis: Secondary | ICD-10-CM | POA: Diagnosis not present

## 2018-11-27 DIAGNOSIS — D509 Iron deficiency anemia, unspecified: Secondary | ICD-10-CM | POA: Diagnosis not present

## 2018-11-30 DIAGNOSIS — D509 Iron deficiency anemia, unspecified: Secondary | ICD-10-CM | POA: Diagnosis not present

## 2018-11-30 DIAGNOSIS — N2581 Secondary hyperparathyroidism of renal origin: Secondary | ICD-10-CM | POA: Diagnosis not present

## 2018-11-30 DIAGNOSIS — N186 End stage renal disease: Secondary | ICD-10-CM | POA: Diagnosis not present

## 2018-12-01 NOTE — Progress Notes (Signed)
If so, I would just repeat a lab in 3 month before referral

## 2018-12-04 DIAGNOSIS — D509 Iron deficiency anemia, unspecified: Secondary | ICD-10-CM | POA: Diagnosis not present

## 2018-12-04 DIAGNOSIS — N2581 Secondary hyperparathyroidism of renal origin: Secondary | ICD-10-CM | POA: Diagnosis not present

## 2018-12-04 DIAGNOSIS — N186 End stage renal disease: Secondary | ICD-10-CM | POA: Diagnosis not present

## 2018-12-07 DIAGNOSIS — N2581 Secondary hyperparathyroidism of renal origin: Secondary | ICD-10-CM | POA: Diagnosis not present

## 2018-12-07 DIAGNOSIS — D509 Iron deficiency anemia, unspecified: Secondary | ICD-10-CM | POA: Diagnosis not present

## 2018-12-07 DIAGNOSIS — N186 End stage renal disease: Secondary | ICD-10-CM | POA: Diagnosis not present

## 2018-12-09 DIAGNOSIS — N2581 Secondary hyperparathyroidism of renal origin: Secondary | ICD-10-CM | POA: Diagnosis not present

## 2018-12-09 DIAGNOSIS — N186 End stage renal disease: Secondary | ICD-10-CM | POA: Diagnosis not present

## 2018-12-09 DIAGNOSIS — D509 Iron deficiency anemia, unspecified: Secondary | ICD-10-CM | POA: Diagnosis not present

## 2018-12-11 DIAGNOSIS — N186 End stage renal disease: Secondary | ICD-10-CM | POA: Diagnosis not present

## 2018-12-11 DIAGNOSIS — D509 Iron deficiency anemia, unspecified: Secondary | ICD-10-CM | POA: Diagnosis not present

## 2018-12-11 DIAGNOSIS — N2581 Secondary hyperparathyroidism of renal origin: Secondary | ICD-10-CM | POA: Diagnosis not present

## 2018-12-13 NOTE — Progress Notes (Deleted)
Cardiology Office Note    Date:  12/13/2018   ID:  Kenneth [name], Kenneth [name] [birth year], MRN [id number]  PCP:  [name], MD  Cardiologist:  Dr. [name]  No chief complaint on file.   History of Present Illness:  Kenneth [name] is a 63 y.o. male with past medical history of diastolic heart failure, heavy EtOH use, atrial fibrillation and atrial flutter not a candidate for anticoagulation due to poor compliance, ESRD on HD TTS, hypertension and history of nonsustained VT.  His EF has been low in 25 to 30% range in 2017.  Repeat echocardiogram in February 2019 showed normalization of LVEF of 65 to 70%.  He was admitted in May 2019 with acute hypoxic respiratory failure due to pulmonary edema from noncompliance with hemodialysis.  While in the emergency room, he suffered a PEA arrest.  He received empiric treatment for hyperkalemia and shock for wide complex tachycardia.  He was also treated with amiodarone as well.  He achieved ROSC after 10 minutes of ACLS, following this he had A. fib with RVR and ultimately reverted to sinus rhythm.  Cardiology was not consulted at the time.  Echocardiogram obtained on 03/09/2018 demonstrated severe LVH, normal EF of 55 to 60%, moderately decreased RV systolic function and a thickened aortic and mitral valve.  There was some mention in the echocardiogram for consideration of evaluation for cardiac amyloidosis including PYP scan, this never took place.  Patient was admitted in early November 2019 with chest pain.  During the hospitalization, he had a significant T wave inversion in inferior and lateral leads concerning for progression of coronary artery disease.  He ended up having cardiac catheterization on 09/02/2018 which showed severe multivessel CAD involving distal left main, ostial to proximal LAD, ostial to proximal left circumflex, mid RCA.  CT surgery was consulted.  Patient was seen by Dr. Prescott Gum on 09/03/2018, given his comorbidities, although CABG  likely will improve his cardiac issue but likely will result in further severe problems with multiorgan failure and dysfunction.  Therefore he was turned down for bypass surgery.  He was felt to be a very high risk patient for PCI and the risk was prohibitive especially in light of his compliance issue.  The procedure is also high risk as he would require unprotected left main intervention, atherectomy of LAD and bifurcation stenting.  He was eventually discharged on medical therapy.  However he returned to the hospital on 09/17/2018 with sudden onset of chest pain.  Initial EKG showed severe ST depression in inferolateral leads.  A code STEMI was called however subsequently canceled.  He was admitted for observation, cardiac enzyme was negative.  He was continue on aspirin, Plavix and statin.  When I saw the patient in December, the only medication he was taking was aspirin, Toprol-XL and sublingual nitroglycerin.  He did have to take 1 sublingual nitroglycerin due to recurrence of chest pain.  He was not able to obtain Lipitor, Plavix and Imdur due to cost reason.  He presents today for follow-up.  He is able to afford his medication at this point.  However his Plavix was stopped a few days ago as his prescription was ran out.  I will restart his Plavix after loading dose.  He is still having occasional chest pain about once a week.  This is been suppressed by sublingual nitroglycerin.  I will give him a refill for his sublingual nitroglycerin this point.  He is aware that if the chest  discomfort lasted longer or become more frequent, he will need to let us know.  If he take 3 consecutive nitroglycerin, he will also need to seek urgent medical attention at the local ED.  He does admit to have some heartburn symptoms as well which is brought on by spicy food and tacos.  I will give him a prescription for Protonix 40 mg daily.   Past Medical History:  Diagnosis Date  . Acute CHF ([place]) 01/2018  .  Cardiomyopathy secondary    likely related to HTN heart disease; possibly ETOH related as well  . Chronic combined systolic and diastolic heart failure (HCC)    Echocardiogram 09/22/11: Moderate LVH, EF 16-10%, grade 3 diastolic dysfunction, mild MR, moderate to severe LAE, mild RVE, mild to moderate TR, small to moderate pericardial effusion  . ESRD (end stage renal disease) on dialysis ([place])    due to hypertensive nephrosclerosis; TTS; [facility] (09/01/2018)  . History of alcohol abuse   . Hypertension     Past Surgical History:  Procedure Laterality Date  . AV FISTULA PLACEMENT Left 05/14/2016   Procedure: LEFT ARM BASILIC VEIN TRANSPOSITION;  Surgeon: Rosetta Posner, MD;  Location: [facility];  Service: Vascular;  Laterality: Left;  . LEFT HEART CATH AND CORONARY ANGIOGRAPHY N/A 09/02/2018   Procedure: LEFT HEART CATH AND CORONARY ANGIOGRAPHY;  Surgeon: Nelva Bush, MD;  Location: [facility];  Service: Cardiovascular;  Laterality: N/A;  . PERIPHERAL VASCULAR CATHETERIZATION N/A 05/13/2016   Procedure: Dialysis/Perma Catheter Insertion;  Surgeon: Serafina Mitchell, MD;  Location: [facility];  Service: Cardiovascular;  Laterality: N/A;    Current Medications: Outpatient Medications Prior to Visit  Medication Sig Dispense Refill  . aspirin 81 MG EC tablet Take 1 tablet (81 mg total) by mouth daily. 30 tablet 0  . atorvastatin (LIPITOR) 80 MG tablet Take 1 tablet (80 mg total) by mouth daily at 6 PM. 30 tablet 0  . clopidogrel (PLAVIX) 75 MG tablet Take 1 tablet (75 mg total) by mouth daily. Take 300mg  (4tab)for 1st dose only then 75mg  qd 34 tablet 3  . isosorbide mononitrate (IMDUR) 60 MG 24 hr tablet Take 1 tablet (60 mg total) by mouth every evening for 30 days. 30 tablet 3  . metoprolol succinate (TOPROL-XL) 25 MG 24 hr tablet Take 1 tablet (25 mg total) by mouth daily. 30 tablet 0  . nitroGLYCERIN (NITROSTAT) 0.4 MG SL tablet Place 1 tablet (0.4 mg total) under the tongue  every 5 (five) minutes x 3 doses as needed for chest pain. 25 tablet 3  . pantoprazole (PROTONIX) 40 MG tablet Take 1 tablet (40 mg total) by mouth daily. 30 tablet 3   No facility-administered medications prior to visit.      Allergies:   Patient has no known allergies.   Social History   Socioeconomic History  . Marital status: Divorced    Spouse name: Not on file  . Number of children: Not on file  . Years of education: Not on file  . Highest education level: Not on file  Occupational History  . Not on file  Social Needs  . Financial resource strain: Not on file  . Food insecurity:    Worry: Not on file    Inability: Not on file  . Transportation needs:    Medical: Not on file    Non-medical: Not on file  Tobacco Use  . Smoking status: Former Smoker    Packs/day: 2.00  Years: 48.00    Pack years: 96.00    Types: Cigars    Last attempt to quit: 10/27/2017    Years since quitting: 1.1  . Smokeless tobacco: Former Systems developer    Types: Chew  Substance and Sexual Activity  . Alcohol use: Yes    Frequency: Never    Comment: 09/01/2018 "nothing last 5 months"  . Drug use: No  . Sexual activity: Not Currently  Lifestyle  . Physical activity:    Days per week: Not on file    Minutes per session: Not on file  . Stress: Not on file  Relationships  . Social connections:    Talks on phone: Not on file    Gets together: Not on file    Attends religious service: Not on file    Active member of club or organization: Not on file    Attends meetings of clubs or organizations: Not on file    Relationship status: Not on file  Other Topics Concern  . Not on file  Social History Narrative  . Not on file     Family History:  The patient's family history includes Cirrhosis in his father; Emphysema in his mother.   ROS:   Please see the history of present illness.    ROS All other systems reviewed and are negative.   PHYSICAL EXAM:   VS:  There were no vitals taken for this  visit.   GEN: Well nourished, well developed, in no acute distress  HEENT: normal  Neck: no JVD, carotid bruits, or masses Cardiac: RRR; no murmurs, rubs, or gallops,no edema  Respiratory:  clear to auscultation bilaterally, normal work of breathing GI: soft, nontender, nondistended, + BS MS: no deformity or atrophy  Skin: warm and dry, no rash Neuro:  Alert and Oriented x 3, Strength and sensation are intact Psych: euthymic mood, full affect  Wt Readings from Last 3 Encounters:  11/19/18 203 lb 9.6 oz (92.4 kg)  09/29/18 206 lb (93.4 kg)  09/21/18 194 lb 3.6 oz (88.1 kg)      Studies/Labs Reviewed:   EKG:  EKG is not ordered today.    Recent Labs: 08/10/2018: B Natriuretic Peptide 678.7 09/06/2018: Magnesium 2.3 09/19/2018: BUN 42; Creatinine, Ser 9.97; Hemoglobin 10.5; Platelets 225; Potassium 4.8; Sodium 135 11/19/2018: ALT 8   Lipid Panel    Component Value Date/Time   CHOL 193 11/19/2018 0959   TRIG 120 11/19/2018 0959   HDL 50 11/19/2018 0959   CHOLHDL 3.9 11/19/2018 0959   CHOLHDL 4.3 09/17/2018 0604   VLDL 37 09/17/2018 0604   LDLCALC 119 (H) 11/19/2018 0959    Additional studies/ records that were reviewed today include:   Cath 09/02/2018 Conclusions: 1. Severe multivessel coronary artery disease involving the distal LMCA, ostial/proximal LAD, ostial and proximal LCx, and mid RCA. 2. Normal left ventricular filling pressure. 3. Scattered atherosclerotic calcification of the ascending aorta.  Recommendations: 1. Cardiac surgery consultation for CABG.  CT chest may be helpful to further assess degree of aortic calcification. 2. Initiate heparin infusion 8 hours after sheath removal. 3. Aggressive secondary prevention, including indefinite aspirin 81 mg daily and high-intensity statin therapy. 4. Plan for hemodialysis tomorrow.   Echo 09/18/2018 LV EF: 55% -   60% Study Conclusions  - Left ventricle: The cavity size was normal. Wall thickness was    increased in a pattern of mild LVH. Systolic function was normal.   The estimated ejection fraction was in the range of 55% to  60%.   Doppler parameters are consistent with abnormal left ventricular   relaxation (grade 1 diastolic dysfunction). The E/e&' ratio is <8,   suggesting normal LV filling pressure. - Aortic valve: Poorly visualized. Mildly calcified leaflets. There   was no stenosis. - Mitral valve: Calcified annulus. Mildly thickened leaflets .   There was trivial regurgitation. - Left atrium: The atrium was mildly dilated.  Impressions:  - Compared to a prior study on 09/01/2018, there are no significant   changes.    ASSESSMENT:    No diagnosis found.   PLAN:  In order of problems listed above:  1.   CAD: Severe multivessel disease noted on last cardiac catheterization in November.  Patient would not be a candidate for bypass surgery.  He is being treated medically.  He continued to have intermittent chest discomfort about once a week.  His blood pressure does not allow me to further uptitrate antianginal medication.  Continue Imdur 60 mg daily and add Toprol-XL.  He ran out of Plavix, I will refill Plavix.  He does have some burning sensation in the chest, this is more consistent with acid reflux symptom, I will start him on a course of Protonix 40 mg daily  2.   Chronic diastolic heart failure: Appears to be euvolemic on physical exam.  Fluid level managed using dialysis  3.   End-stage renal disease on hemodialysis: Blood pressure stable  4. History of nonsustained VT: No recent recurrence of dizziness.  Despite three-vessel disease, his recent echocardiogram showed a normal ejection fraction.    Medication Adjustments/Labs and Tests Ordered: Current medicines are reviewed at length with the patient today.  Concerns regarding medicines are outlined above.  Medication changes, Labs and Tests ordered today are listed in the Patient Instructions below. There are no  Patient Instructions on file for this visit.   Signed, Diesel Lina [name], MD  12/13/2018 7:25 AM    [facility] [address], [place], [place]  [address] Phone: [phone]; Fax: [phone]

## 2018-12-14 ENCOUNTER — Other Ambulatory Visit: Payer: Self-pay | Admitting: Cardiology

## 2018-12-14 DIAGNOSIS — N2581 Secondary hyperparathyroidism of renal origin: Secondary | ICD-10-CM | POA: Diagnosis not present

## 2018-12-14 DIAGNOSIS — N186 End stage renal disease: Secondary | ICD-10-CM | POA: Diagnosis not present

## 2018-12-14 DIAGNOSIS — D509 Iron deficiency anemia, unspecified: Secondary | ICD-10-CM | POA: Diagnosis not present

## 2018-12-14 NOTE — Telephone Encounter (Signed)
 *  STAT* If patient is at the pharmacy, call can be transferred to refill team.   1. Which medications need to be refilled? (please list name of each medication and dose if known) atorvastatin (LIPITOR) 80 MG tablet  2. Which pharmacy/location (including street and city if local pharmacy) is medication to be sent to? [facility] [address]  3. Do they need a 30 day or 90 day supply? [place]

## 2018-12-15 ENCOUNTER — Ambulatory Visit: Payer: Medicare Other | Admitting: Cardiology

## 2018-12-15 MED ORDER — ATORVASTATIN CALCIUM 80 MG PO TABS: 80.0000 mg | ORAL_TABLET | Freq: Every day | ORAL | 11 refills | Status: DC

## 2018-12-15 NOTE — Telephone Encounter (Signed)
Rx(s) sent to pharmacy electronically.  

## 2018-12-15 NOTE — Addendum Note (Signed)
Addended by: Diana Eves on: 12/15/2018 11:03 AM   Modules accepted: Orders

## 2018-12-16 DIAGNOSIS — N186 End stage renal disease: Secondary | ICD-10-CM | POA: Diagnosis not present

## 2018-12-16 DIAGNOSIS — D509 Iron deficiency anemia, unspecified: Secondary | ICD-10-CM | POA: Diagnosis not present

## 2018-12-16 DIAGNOSIS — N2581 Secondary hyperparathyroidism of renal origin: Secondary | ICD-10-CM | POA: Diagnosis not present

## 2018-12-18 DIAGNOSIS — N2581 Secondary hyperparathyroidism of renal origin: Secondary | ICD-10-CM | POA: Diagnosis not present

## 2018-12-18 DIAGNOSIS — D509 Iron deficiency anemia, unspecified: Secondary | ICD-10-CM | POA: Diagnosis not present

## 2018-12-18 DIAGNOSIS — N186 End stage renal disease: Secondary | ICD-10-CM | POA: Diagnosis not present

## 2018-12-21 DIAGNOSIS — N186 End stage renal disease: Secondary | ICD-10-CM | POA: Diagnosis not present

## 2018-12-21 DIAGNOSIS — D509 Iron deficiency anemia, unspecified: Secondary | ICD-10-CM | POA: Diagnosis not present

## 2018-12-21 DIAGNOSIS — N2581 Secondary hyperparathyroidism of renal origin: Secondary | ICD-10-CM | POA: Diagnosis not present

## 2018-12-23 DIAGNOSIS — D509 Iron deficiency anemia, unspecified: Secondary | ICD-10-CM | POA: Diagnosis not present

## 2018-12-23 DIAGNOSIS — N2581 Secondary hyperparathyroidism of renal origin: Secondary | ICD-10-CM | POA: Diagnosis not present

## 2018-12-23 DIAGNOSIS — N186 End stage renal disease: Secondary | ICD-10-CM | POA: Diagnosis not present

## 2018-12-25 DIAGNOSIS — N186 End stage renal disease: Secondary | ICD-10-CM | POA: Diagnosis not present

## 2018-12-25 DIAGNOSIS — N2581 Secondary hyperparathyroidism of renal origin: Secondary | ICD-10-CM | POA: Diagnosis not present

## 2018-12-25 DIAGNOSIS — D509 Iron deficiency anemia, unspecified: Secondary | ICD-10-CM | POA: Diagnosis not present

## 2018-12-26 DIAGNOSIS — N186 End stage renal disease: Secondary | ICD-10-CM | POA: Diagnosis not present

## 2018-12-26 DIAGNOSIS — Z992 Dependence on renal dialysis: Secondary | ICD-10-CM | POA: Diagnosis not present

## 2018-12-26 DIAGNOSIS — I129 Hypertensive chronic kidney disease with stage 1 through stage 4 chronic kidney disease, or unspecified chronic kidney disease: Secondary | ICD-10-CM | POA: Diagnosis not present

## 2018-12-28 DIAGNOSIS — N2581 Secondary hyperparathyroidism of renal origin: Secondary | ICD-10-CM | POA: Diagnosis not present

## 2018-12-28 DIAGNOSIS — N186 End stage renal disease: Secondary | ICD-10-CM | POA: Diagnosis not present

## 2018-12-28 DIAGNOSIS — D509 Iron deficiency anemia, unspecified: Secondary | ICD-10-CM | POA: Diagnosis not present

## 2018-12-30 DIAGNOSIS — D509 Iron deficiency anemia, unspecified: Secondary | ICD-10-CM | POA: Diagnosis not present

## 2018-12-30 DIAGNOSIS — N2581 Secondary hyperparathyroidism of renal origin: Secondary | ICD-10-CM | POA: Diagnosis not present

## 2018-12-30 DIAGNOSIS — N186 End stage renal disease: Secondary | ICD-10-CM | POA: Diagnosis not present

## 2019-01-01 DIAGNOSIS — D509 Iron deficiency anemia, unspecified: Secondary | ICD-10-CM | POA: Diagnosis not present

## 2019-01-01 DIAGNOSIS — N186 End stage renal disease: Secondary | ICD-10-CM | POA: Diagnosis not present

## 2019-01-01 DIAGNOSIS — N2581 Secondary hyperparathyroidism of renal origin: Secondary | ICD-10-CM | POA: Diagnosis not present

## 2019-01-04 DIAGNOSIS — D509 Iron deficiency anemia, unspecified: Secondary | ICD-10-CM | POA: Diagnosis not present

## 2019-01-04 DIAGNOSIS — N186 End stage renal disease: Secondary | ICD-10-CM | POA: Diagnosis not present

## 2019-01-04 DIAGNOSIS — N2581 Secondary hyperparathyroidism of renal origin: Secondary | ICD-10-CM | POA: Diagnosis not present

## 2019-01-06 DIAGNOSIS — D509 Iron deficiency anemia, unspecified: Secondary | ICD-10-CM | POA: Diagnosis not present

## 2019-01-06 DIAGNOSIS — N186 End stage renal disease: Secondary | ICD-10-CM | POA: Diagnosis not present

## 2019-01-06 DIAGNOSIS — N2581 Secondary hyperparathyroidism of renal origin: Secondary | ICD-10-CM | POA: Diagnosis not present

## 2019-01-08 DIAGNOSIS — D509 Iron deficiency anemia, unspecified: Secondary | ICD-10-CM | POA: Diagnosis not present

## 2019-01-08 DIAGNOSIS — N186 End stage renal disease: Secondary | ICD-10-CM | POA: Diagnosis not present

## 2019-01-08 DIAGNOSIS — N2581 Secondary hyperparathyroidism of renal origin: Secondary | ICD-10-CM | POA: Diagnosis not present

## 2019-01-11 DIAGNOSIS — N186 End stage renal disease: Secondary | ICD-10-CM | POA: Diagnosis not present

## 2019-01-11 DIAGNOSIS — D509 Iron deficiency anemia, unspecified: Secondary | ICD-10-CM | POA: Diagnosis not present

## 2019-01-11 DIAGNOSIS — N2581 Secondary hyperparathyroidism of renal origin: Secondary | ICD-10-CM | POA: Diagnosis not present

## 2019-01-13 DIAGNOSIS — D509 Iron deficiency anemia, unspecified: Secondary | ICD-10-CM | POA: Diagnosis not present

## 2019-01-13 DIAGNOSIS — N2581 Secondary hyperparathyroidism of renal origin: Secondary | ICD-10-CM | POA: Diagnosis not present

## 2019-01-13 DIAGNOSIS — N186 End stage renal disease: Secondary | ICD-10-CM | POA: Diagnosis not present

## 2019-01-15 DIAGNOSIS — N2581 Secondary hyperparathyroidism of renal origin: Secondary | ICD-10-CM | POA: Diagnosis not present

## 2019-01-15 DIAGNOSIS — N186 End stage renal disease: Secondary | ICD-10-CM | POA: Diagnosis not present

## 2019-01-15 DIAGNOSIS — D509 Iron deficiency anemia, unspecified: Secondary | ICD-10-CM | POA: Diagnosis not present

## 2019-01-18 DIAGNOSIS — D509 Iron deficiency anemia, unspecified: Secondary | ICD-10-CM | POA: Diagnosis not present

## 2019-01-18 DIAGNOSIS — N186 End stage renal disease: Secondary | ICD-10-CM | POA: Diagnosis not present

## 2019-01-18 DIAGNOSIS — N2581 Secondary hyperparathyroidism of renal origin: Secondary | ICD-10-CM | POA: Diagnosis not present

## 2019-01-19 DIAGNOSIS — Z992 Dependence on renal dialysis: Secondary | ICD-10-CM | POA: Diagnosis not present

## 2019-01-19 DIAGNOSIS — I871 Compression of vein: Secondary | ICD-10-CM | POA: Diagnosis not present

## 2019-01-19 DIAGNOSIS — N186 End stage renal disease: Secondary | ICD-10-CM | POA: Diagnosis not present

## 2019-01-20 DIAGNOSIS — D509 Iron deficiency anemia, unspecified: Secondary | ICD-10-CM | POA: Diagnosis not present

## 2019-01-20 DIAGNOSIS — N2581 Secondary hyperparathyroidism of renal origin: Secondary | ICD-10-CM | POA: Diagnosis not present

## 2019-01-20 DIAGNOSIS — N186 End stage renal disease: Secondary | ICD-10-CM | POA: Diagnosis not present

## 2019-01-22 DIAGNOSIS — D509 Iron deficiency anemia, unspecified: Secondary | ICD-10-CM | POA: Diagnosis not present

## 2019-01-22 DIAGNOSIS — N2581 Secondary hyperparathyroidism of renal origin: Secondary | ICD-10-CM | POA: Diagnosis not present

## 2019-01-22 DIAGNOSIS — N186 End stage renal disease: Secondary | ICD-10-CM | POA: Diagnosis not present

## 2019-01-25 DIAGNOSIS — D509 Iron deficiency anemia, unspecified: Secondary | ICD-10-CM | POA: Diagnosis not present

## 2019-01-25 DIAGNOSIS — N186 End stage renal disease: Secondary | ICD-10-CM | POA: Diagnosis not present

## 2019-01-25 DIAGNOSIS — N2581 Secondary hyperparathyroidism of renal origin: Secondary | ICD-10-CM | POA: Diagnosis not present

## 2019-01-26 DIAGNOSIS — Z992 Dependence on renal dialysis: Secondary | ICD-10-CM | POA: Diagnosis not present

## 2019-01-26 DIAGNOSIS — N186 End stage renal disease: Secondary | ICD-10-CM | POA: Diagnosis not present

## 2019-01-26 DIAGNOSIS — I129 Hypertensive chronic kidney disease with stage 1 through stage 4 chronic kidney disease, or unspecified chronic kidney disease: Secondary | ICD-10-CM | POA: Diagnosis not present

## 2019-01-29 DIAGNOSIS — N186 End stage renal disease: Secondary | ICD-10-CM | POA: Diagnosis not present

## 2019-01-29 DIAGNOSIS — N2581 Secondary hyperparathyroidism of renal origin: Secondary | ICD-10-CM | POA: Diagnosis not present

## 2019-01-29 DIAGNOSIS — D509 Iron deficiency anemia, unspecified: Secondary | ICD-10-CM | POA: Diagnosis not present

## 2019-02-01 DIAGNOSIS — D509 Iron deficiency anemia, unspecified: Secondary | ICD-10-CM | POA: Diagnosis not present

## 2019-02-01 DIAGNOSIS — N2581 Secondary hyperparathyroidism of renal origin: Secondary | ICD-10-CM | POA: Diagnosis not present

## 2019-02-01 DIAGNOSIS — N186 End stage renal disease: Secondary | ICD-10-CM | POA: Diagnosis not present

## 2019-02-03 DIAGNOSIS — N186 End stage renal disease: Secondary | ICD-10-CM | POA: Diagnosis not present

## 2019-02-03 DIAGNOSIS — D509 Iron deficiency anemia, unspecified: Secondary | ICD-10-CM | POA: Diagnosis not present

## 2019-02-03 DIAGNOSIS — N2581 Secondary hyperparathyroidism of renal origin: Secondary | ICD-10-CM | POA: Diagnosis not present

## 2019-02-04 ENCOUNTER — Telehealth: Payer: Self-pay | Admitting: Cardiology

## 2019-02-04 NOTE — Telephone Encounter (Signed)
lvm for pre reg x3

## 2019-02-04 NOTE — Progress Notes (Deleted)
{Choose 1 Note Type (Telehealth Visit or Telephone Visit):[phone]}   Evaluation Performed:  Follow-up visit  Date:  02/04/2019   ID:  Kenneth [name], Kenneth [name] 1956/03/28, MRN [id number]  Patient Location: Home  Provider Location: Home  PCP:  [name], MD  Cardiologist:  Myriah Boggus [name], MD  Electrophysiologist:  None   Chief Complaint:  ***  History of Present Illness:    Kenneth [name] is a 63 y.o. male who presents via audio/video conferencing for a telehealth visit today.  He has a past medical history of severe CAD, diastolic heart failure, heavy EtOH use, atrial fibrillation and atrial flutter not a candidate for anticoagulation due to poor compliance, ESRD on HD TTS, hypertension and history of nonsustained VT. His EF has been low in 25 to 30% range in 2017. Repeat echocardiogram in February 2019 showed normalization of LVEF of 65 to 70%. He was admitted in May 2019 with acute hypoxic respiratory failure due to pulmonary edema from noncompliance with hemodialysis. While in the emergency room, he suffered a PEA arrest. He received empiric treatment for hyperkalemia and shock for wide complex tachycardia. He was also treated with amiodarone as well. He achieved ROSC after 10 minutes of ACLS, following this he had A. fib with RVR and ultimately reverted to sinus rhythm. Cardiology was not consulted at the time. Echocardiogram obtained on 03/09/2018 demonstrated severe LVH, normal EF of 55 to 60%, moderately decreased RV systolic function and a thickened aortic and mitral valve. There was some mention in the echocardiogram for consideration of evaluation for cardiac amyloidosis including PYP scan, this never took place. Patient was admitted in early November 2019 with chest pain. During the hospitalization, he had a significant T wave inversion in inferior and lateral leads concerning for progression of coronary artery disease. He ended up having cardiac catheterization on  09/02/2018 which showed severe multivessel CAD involving distal left main, ostial to proximal LAD, ostial to proximal left circumflex, mid RCA. CT surgery was consulted. Patient was seen by Dr. Prescott Gum on 09/03/2018,given his comorbidities, although CABG likely will improve his cardiac issue but likely will result in further severe problems with multiorgan failure and dysfunction. Therefore he was turned down for bypass surgery. He was felt to be a very high risk patient for PCI and the risk was prohibitive especially in light of his compliance issue. The procedure is also high risk as he would require unprotected left main intervention, atherectomy of LAD and bifurcation stenting. He was eventually discharged on medical therapy. However he returned to the hospital on 09/17/2018 with sudden onset of chest pain. Initial EKG showed severe ST depression in inferolateral leads. A code STEMI was called however subsequently canceled. He was admitted for observation, cardiac enzyme was negative. He was continue on aspirin, Plavix and statin.  He was last seen in January. Had stable angina at that time.    The patient {does/does not:200015} have symptoms concerning for COVID-19 infection (fever, chills, cough, or new shortness of breath).    Past Medical History:  Diagnosis Date  . Acute CHF ([place]) 01/2018  . Cardiomyopathy secondary    likely related to HTN heart disease; possibly ETOH related as well  . Chronic combined systolic and diastolic heart failure (HCC)    Echocardiogram 09/22/11: Moderate LVH, EF 67-12%, grade 3 diastolic dysfunction, mild MR, moderate to severe LAE, mild RVE, mild to moderate TR, small to moderate pericardial effusion  . ESRD (end stage renal disease) on dialysis ([place])  due to hypertensive nephrosclerosis; TTS; [facility] (09/01/2018)  . History of alcohol abuse   . Hypertension    Past Surgical History:  Procedure Laterality Date  . AV FISTULA PLACEMENT Left  05/14/2016   Procedure: LEFT ARM BASILIC VEIN TRANSPOSITION;  Surgeon: Rosetta Posner, MD;  Location: [facility];  Service: Vascular;  Laterality: Left;  . LEFT HEART CATH AND CORONARY ANGIOGRAPHY N/A 09/02/2018   Procedure: LEFT HEART CATH AND CORONARY ANGIOGRAPHY;  Surgeon: Nelva Bush, MD;  Location: [facility];  Service: Cardiovascular;  Laterality: N/A;  . PERIPHERAL VASCULAR CATHETERIZATION N/A 05/13/2016   Procedure: Dialysis/Perma Catheter Insertion;  Surgeon: Serafina Mitchell, MD;  Location: [facility];  Service: Cardiovascular;  Laterality: N/A;     No outpatient medications have been marked as taking for the 02/07/19 encounter (Appointment) with [name], Jdyn Parkerson M, MD.     Allergies:   Patient has no known allergies.   Social History   Tobacco Use  . Smoking status: Former Smoker    Packs/day: 2.00    Years: 48.00    Pack years: 96.00    Types: Cigars    Last attempt to quit: 10/27/2017    Years since quitting: 1.2  . Smokeless tobacco: Former Systems developer    Types: Chew  Substance Use Topics  . Alcohol use: Yes    Frequency: Never    Comment: 09/01/2018 "nothing last 5 months"  . Drug use: No     Family Hx: The patient's family history includes Cirrhosis in his father; Emphysema in his mother.  ROS:   Please see the history of present illness.    *** All other systems reviewed and are negative.   Prior CV studies:   The following studies were reviewed today:  Cath 09/02/2018 Conclusions: 1. Severe multivessel coronary artery disease involving the distal LMCA, ostial/proximal LAD, ostial and proximal LCx, and mid RCA. 2. Normal left ventricular filling pressure. 3. Scattered atherosclerotic calcification of the ascending aorta.  Recommendations: 1. Cardiac surgery consultation for CABG. CT chest may be helpful to further assess degree of aortic calcification. 2. Initiate heparin infusion 8 hours after sheath removal. 3. Aggressive secondary prevention,  including indefinite aspirin 81 mg daily and high-intensity statin therapy. 4. Plan for hemodialysis tomorrow.   Echo 09/18/2018 LV EF: 55% - 60% Study Conclusions  - Left ventricle: The cavity size was normal. Wall thickness was increased in a pattern of mild LVH. Systolic function was normal. The estimated ejection fraction was in the range of 55% to 60%. Doppler parameters are consistent with abnormal left ventricular relaxation (grade 1 diastolic dysfunction). The E/e&' ratio is <8, suggesting normal LV filling pressure. - Aortic valve: Poorly visualized. Mildly calcified leaflets. There was no stenosis. - Mitral valve: Calcified annulus. Mildly thickened leaflets . There was trivial regurgitation. - Left atrium: The atrium was mildly dilated.  Impressions:  - Compared to a prior study on 09/01/2018, there are no significant changes.  Labs/Other Tests and Data Reviewed:    EKG:  No ECG reviewed.  Recent Labs: 08/10/2018: B Natriuretic Peptide 678.7 09/06/2018: Magnesium 2.3 09/19/2018: BUN 42; Creatinine, Ser 9.97; Hemoglobin 10.5; Platelets 225; Potassium 4.8; Sodium 135 11/19/2018: ALT 8   Recent Lipid Panel Lab Results  Component Value Date/Time   CHOL 193 11/19/2018 09:59 AM   TRIG 120 11/19/2018 09:59 AM   HDL 50 11/19/2018 09:59 AM   CHOLHDL 3.9 11/19/2018 09:59 AM   CHOLHDL 4.3 09/17/2018 06:04 AM   LDLCALC 119 (  H) 11/19/2018 09:59 AM    Wt Readings from Last 3 Encounters:  11/19/18 203 lb 9.6 oz (92.4 kg)  09/29/18 206 lb (93.4 kg)  09/21/18 194 lb 3.6 oz (88.1 kg)     Objective:    Vital Signs:  There were no vitals taken for this visit.   Well nourished, well developed male in no*** acute distress. ***  ASSESSMENT & PLAN:    1. CAD: Severe multivessel disease noted on last cardiac catheterization in November 2019.  He is not a candidate for CABG or PCI due to significant co-morbidities and noncompliance.  He is being  treated medically.  He continued to have intermittent chest discomfort about once a week.  His blood pressure does not allow me to further uptitrate antianginal medication.  Continue Imdur 60 mg daily and add Toprol-XL.  He ran out of Plavix, I will refill Plavix.  He does have some burning sensation in the chest, this is more consistent with acid reflux symptom, I will start him on a course of Protonix 40 mg daily  2. Chronic diastolic heart failure: volume status managed with dialysis.  3. End-stage renal disease on hemodialysis: Blood pressure stable  4. History of nonsustained VT: No recent recurrence of dizziness.  Despite three-vessel disease, his recent echocardiogram showed a normal ejection fraction.  5. History of noncompliance.    COVID-19 Education: The signs and symptoms of COVID-19 were discussed with the patient and how to seek care for testing (follow up with PCP or arrange E-visit).  ***The importance of social distancing was discussed today.  Time:   Today, I have spent *** minutes with the patient with telehealth technology discussing the above problems.     Medication Adjustments/Labs and Tests Ordered: Current medicines are reviewed at length with the patient today.  Concerns regarding medicines are outlined above.  Tests Ordered: No orders of the defined types were placed in this encounter.  Medication Changes: No orders of the defined types were placed in this encounter.   Disposition:  Follow up {follow up:15908}  Signed, Rollan Roger [name], MD  02/04/2019 3:48 PM    [facility]

## 2019-02-05 DIAGNOSIS — N186 End stage renal disease: Secondary | ICD-10-CM | POA: Diagnosis not present

## 2019-02-05 DIAGNOSIS — N2581 Secondary hyperparathyroidism of renal origin: Secondary | ICD-10-CM | POA: Diagnosis not present

## 2019-02-05 DIAGNOSIS — D509 Iron deficiency anemia, unspecified: Secondary | ICD-10-CM | POA: Diagnosis not present

## 2019-02-07 ENCOUNTER — Ambulatory Visit: Payer: Medicare Other | Admitting: Cardiology

## 2019-02-07 ENCOUNTER — Telehealth (INDEPENDENT_AMBULATORY_CARE_PROVIDER_SITE_OTHER): Payer: Medicare Other | Admitting: Cardiology

## 2019-02-08 DIAGNOSIS — N186 End stage renal disease: Secondary | ICD-10-CM | POA: Diagnosis not present

## 2019-02-08 DIAGNOSIS — D509 Iron deficiency anemia, unspecified: Secondary | ICD-10-CM | POA: Diagnosis not present

## 2019-02-08 DIAGNOSIS — N2581 Secondary hyperparathyroidism of renal origin: Secondary | ICD-10-CM | POA: Diagnosis not present

## 2019-02-08 NOTE — Progress Notes (Signed)
Erroneous encounter

## 2019-02-10 DIAGNOSIS — N2581 Secondary hyperparathyroidism of renal origin: Secondary | ICD-10-CM | POA: Diagnosis not present

## 2019-02-10 DIAGNOSIS — N186 End stage renal disease: Secondary | ICD-10-CM | POA: Diagnosis not present

## 2019-02-10 DIAGNOSIS — D509 Iron deficiency anemia, unspecified: Secondary | ICD-10-CM | POA: Diagnosis not present

## 2019-02-12 DIAGNOSIS — D509 Iron deficiency anemia, unspecified: Secondary | ICD-10-CM | POA: Diagnosis not present

## 2019-02-12 DIAGNOSIS — N2581 Secondary hyperparathyroidism of renal origin: Secondary | ICD-10-CM | POA: Diagnosis not present

## 2019-02-12 DIAGNOSIS — N186 End stage renal disease: Secondary | ICD-10-CM | POA: Diagnosis not present

## 2019-02-15 DIAGNOSIS — N2581 Secondary hyperparathyroidism of renal origin: Secondary | ICD-10-CM | POA: Diagnosis not present

## 2019-02-15 DIAGNOSIS — N186 End stage renal disease: Secondary | ICD-10-CM | POA: Diagnosis not present

## 2019-02-15 DIAGNOSIS — D509 Iron deficiency anemia, unspecified: Secondary | ICD-10-CM | POA: Diagnosis not present

## 2019-02-17 DIAGNOSIS — N2581 Secondary hyperparathyroidism of renal origin: Secondary | ICD-10-CM | POA: Diagnosis not present

## 2019-02-17 DIAGNOSIS — D509 Iron deficiency anemia, unspecified: Secondary | ICD-10-CM | POA: Diagnosis not present

## 2019-02-17 DIAGNOSIS — N186 End stage renal disease: Secondary | ICD-10-CM | POA: Diagnosis not present

## 2019-02-19 DIAGNOSIS — N186 End stage renal disease: Secondary | ICD-10-CM | POA: Diagnosis not present

## 2019-02-19 DIAGNOSIS — D509 Iron deficiency anemia, unspecified: Secondary | ICD-10-CM | POA: Diagnosis not present

## 2019-02-19 DIAGNOSIS — N2581 Secondary hyperparathyroidism of renal origin: Secondary | ICD-10-CM | POA: Diagnosis not present

## 2019-02-22 DIAGNOSIS — N2581 Secondary hyperparathyroidism of renal origin: Secondary | ICD-10-CM | POA: Diagnosis not present

## 2019-02-22 DIAGNOSIS — D509 Iron deficiency anemia, unspecified: Secondary | ICD-10-CM | POA: Diagnosis not present

## 2019-02-22 DIAGNOSIS — N186 End stage renal disease: Secondary | ICD-10-CM | POA: Diagnosis not present

## 2019-02-24 DIAGNOSIS — N186 End stage renal disease: Secondary | ICD-10-CM | POA: Diagnosis not present

## 2019-02-24 DIAGNOSIS — D509 Iron deficiency anemia, unspecified: Secondary | ICD-10-CM | POA: Diagnosis not present

## 2019-02-24 DIAGNOSIS — N2581 Secondary hyperparathyroidism of renal origin: Secondary | ICD-10-CM | POA: Diagnosis not present

## 2019-02-25 DIAGNOSIS — Z992 Dependence on renal dialysis: Secondary | ICD-10-CM | POA: Diagnosis not present

## 2019-02-25 DIAGNOSIS — I129 Hypertensive chronic kidney disease with stage 1 through stage 4 chronic kidney disease, or unspecified chronic kidney disease: Secondary | ICD-10-CM | POA: Diagnosis not present

## 2019-02-25 DIAGNOSIS — N186 End stage renal disease: Secondary | ICD-10-CM | POA: Diagnosis not present

## 2019-02-26 DIAGNOSIS — D509 Iron deficiency anemia, unspecified: Secondary | ICD-10-CM | POA: Diagnosis not present

## 2019-02-26 DIAGNOSIS — N186 End stage renal disease: Secondary | ICD-10-CM | POA: Diagnosis not present

## 2019-02-26 DIAGNOSIS — D631 Anemia in chronic kidney disease: Secondary | ICD-10-CM | POA: Diagnosis not present

## 2019-02-26 DIAGNOSIS — N2581 Secondary hyperparathyroidism of renal origin: Secondary | ICD-10-CM | POA: Diagnosis not present

## 2019-03-01 DIAGNOSIS — D631 Anemia in chronic kidney disease: Secondary | ICD-10-CM | POA: Diagnosis not present

## 2019-03-01 DIAGNOSIS — N186 End stage renal disease: Secondary | ICD-10-CM | POA: Diagnosis not present

## 2019-03-01 DIAGNOSIS — N2581 Secondary hyperparathyroidism of renal origin: Secondary | ICD-10-CM | POA: Diagnosis not present

## 2019-03-01 DIAGNOSIS — D509 Iron deficiency anemia, unspecified: Secondary | ICD-10-CM | POA: Diagnosis not present

## 2019-03-03 DIAGNOSIS — D631 Anemia in chronic kidney disease: Secondary | ICD-10-CM | POA: Diagnosis not present

## 2019-03-03 DIAGNOSIS — N2581 Secondary hyperparathyroidism of renal origin: Secondary | ICD-10-CM | POA: Diagnosis not present

## 2019-03-03 DIAGNOSIS — N186 End stage renal disease: Secondary | ICD-10-CM | POA: Diagnosis not present

## 2019-03-03 DIAGNOSIS — D509 Iron deficiency anemia, unspecified: Secondary | ICD-10-CM | POA: Diagnosis not present

## 2019-03-05 DIAGNOSIS — D509 Iron deficiency anemia, unspecified: Secondary | ICD-10-CM | POA: Diagnosis not present

## 2019-03-05 DIAGNOSIS — N2581 Secondary hyperparathyroidism of renal origin: Secondary | ICD-10-CM | POA: Diagnosis not present

## 2019-03-05 DIAGNOSIS — N186 End stage renal disease: Secondary | ICD-10-CM | POA: Diagnosis not present

## 2019-03-05 DIAGNOSIS — D631 Anemia in chronic kidney disease: Secondary | ICD-10-CM | POA: Diagnosis not present

## 2019-03-08 DIAGNOSIS — D509 Iron deficiency anemia, unspecified: Secondary | ICD-10-CM | POA: Diagnosis not present

## 2019-03-08 DIAGNOSIS — N186 End stage renal disease: Secondary | ICD-10-CM | POA: Diagnosis not present

## 2019-03-08 DIAGNOSIS — N2581 Secondary hyperparathyroidism of renal origin: Secondary | ICD-10-CM | POA: Diagnosis not present

## 2019-03-08 DIAGNOSIS — D631 Anemia in chronic kidney disease: Secondary | ICD-10-CM | POA: Diagnosis not present

## 2019-03-10 DIAGNOSIS — D631 Anemia in chronic kidney disease: Secondary | ICD-10-CM | POA: Diagnosis not present

## 2019-03-10 DIAGNOSIS — N186 End stage renal disease: Secondary | ICD-10-CM | POA: Diagnosis not present

## 2019-03-10 DIAGNOSIS — N2581 Secondary hyperparathyroidism of renal origin: Secondary | ICD-10-CM | POA: Diagnosis not present

## 2019-03-10 DIAGNOSIS — D509 Iron deficiency anemia, unspecified: Secondary | ICD-10-CM | POA: Diagnosis not present

## 2019-03-12 DIAGNOSIS — D631 Anemia in chronic kidney disease: Secondary | ICD-10-CM | POA: Diagnosis not present

## 2019-03-12 DIAGNOSIS — N186 End stage renal disease: Secondary | ICD-10-CM | POA: Diagnosis not present

## 2019-03-12 DIAGNOSIS — N2581 Secondary hyperparathyroidism of renal origin: Secondary | ICD-10-CM | POA: Diagnosis not present

## 2019-03-12 DIAGNOSIS — D509 Iron deficiency anemia, unspecified: Secondary | ICD-10-CM | POA: Diagnosis not present

## 2019-03-15 DIAGNOSIS — D631 Anemia in chronic kidney disease: Secondary | ICD-10-CM | POA: Diagnosis not present

## 2019-03-15 DIAGNOSIS — N186 End stage renal disease: Secondary | ICD-10-CM | POA: Diagnosis not present

## 2019-03-15 DIAGNOSIS — N2581 Secondary hyperparathyroidism of renal origin: Secondary | ICD-10-CM | POA: Diagnosis not present

## 2019-03-15 DIAGNOSIS — D509 Iron deficiency anemia, unspecified: Secondary | ICD-10-CM | POA: Diagnosis not present

## 2019-03-19 DIAGNOSIS — N186 End stage renal disease: Secondary | ICD-10-CM | POA: Diagnosis not present

## 2019-03-19 DIAGNOSIS — D509 Iron deficiency anemia, unspecified: Secondary | ICD-10-CM | POA: Diagnosis not present

## 2019-03-19 DIAGNOSIS — D631 Anemia in chronic kidney disease: Secondary | ICD-10-CM | POA: Diagnosis not present

## 2019-03-19 DIAGNOSIS — N2581 Secondary hyperparathyroidism of renal origin: Secondary | ICD-10-CM | POA: Diagnosis not present

## 2019-03-21 IMAGING — CR DG ABD PORTABLE 1V
1 series · 1 of 1 positions shown · non-contrast
Comparison: Chest x-ray 03/12/2018.

CLINICAL DATA: Intubation.  Ileus.

EXAM:
PORTABLE ABDOMEN - 1 VIEW

[AP]
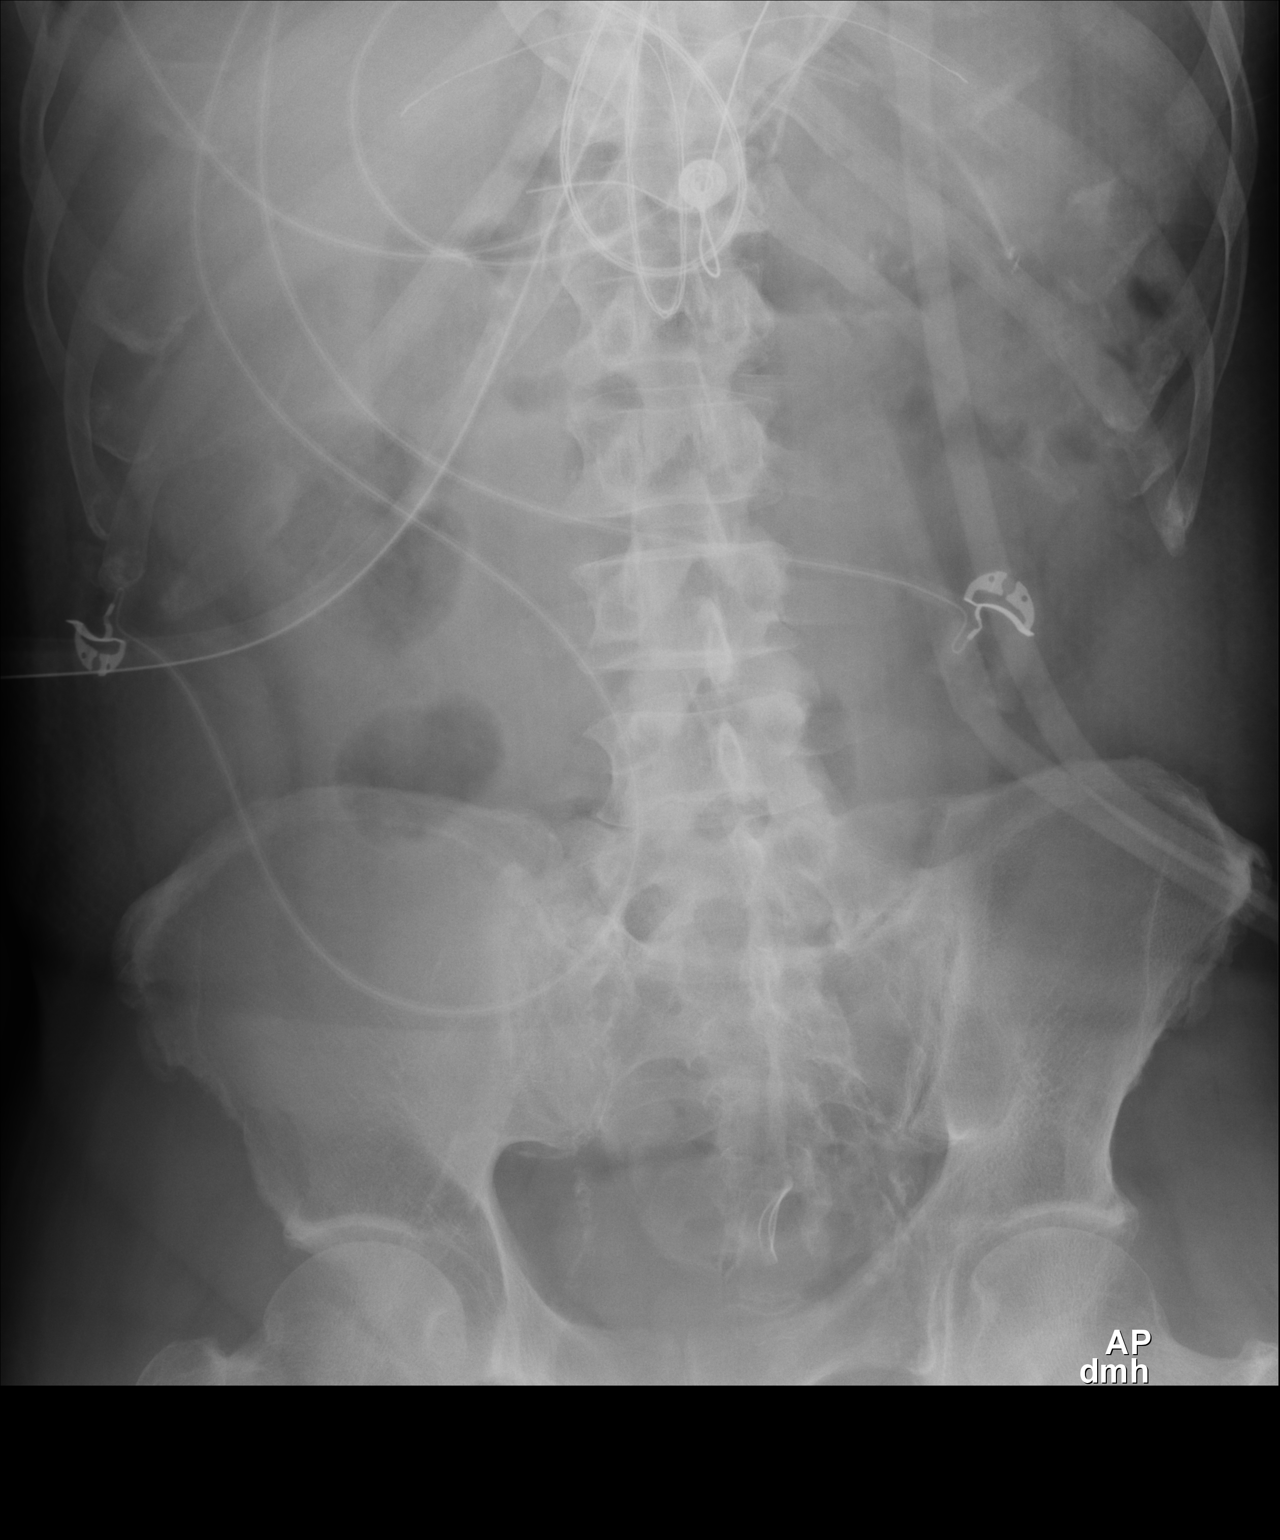

[1 of 1 positions shown; findings below may reference images not displayed]

FINDINGS: NG tube noted coiled stomach. No bowel distention or free air.
Aortoiliac atherosclerotic vascular calcification. No acute bony
abnormality.
IMPRESSION: 1.  NG tube noted coiled stomach.  No bowel distention.

2.  Aortoiliac atherosclerotic vascular disease.

## 2019-03-21 IMAGING — CR DG CHEST 1V PORT
1 series · 1 of 1 positions shown · non-contrast
Comparison: 03/10/2018.

CLINICAL DATA: Intubation.  Ileus.

EXAM:
PORTABLE CHEST 1 VIEW

[AP]
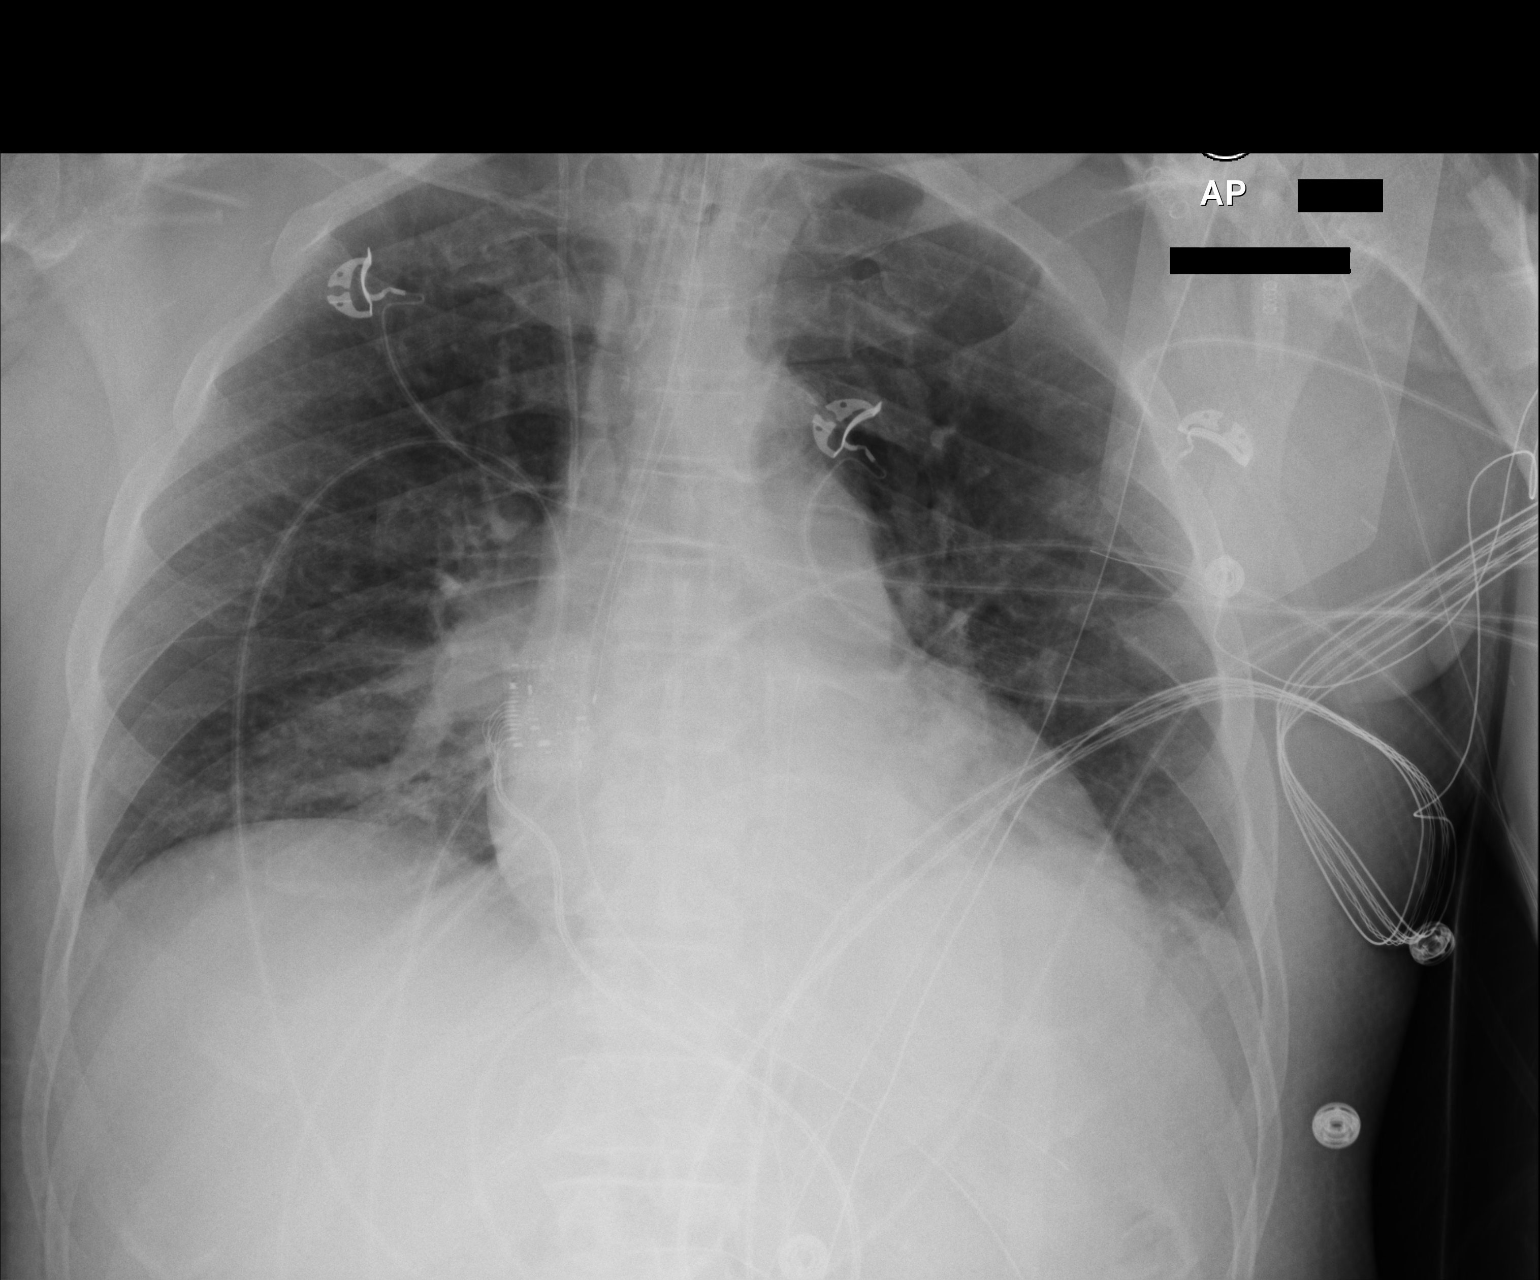

[1 of 1 positions shown; findings below may reference images not displayed]

FINDINGS: Endotracheal tube and NG tube in stable position. Right IJ line
stable position. Stable cardiomegaly. Interim improvement of right
base infiltrate. Mild bibasilar atelectasis.
IMPRESSION: 1.  Lines and tubes in stable position.

2. Interim partial clearing of right base infiltrate. Bibasilar
atelectasis. Stable cardiomegaly.

## 2019-03-22 DIAGNOSIS — D509 Iron deficiency anemia, unspecified: Secondary | ICD-10-CM | POA: Diagnosis not present

## 2019-03-22 DIAGNOSIS — N2581 Secondary hyperparathyroidism of renal origin: Secondary | ICD-10-CM | POA: Diagnosis not present

## 2019-03-22 DIAGNOSIS — D631 Anemia in chronic kidney disease: Secondary | ICD-10-CM | POA: Diagnosis not present

## 2019-03-22 DIAGNOSIS — N186 End stage renal disease: Secondary | ICD-10-CM | POA: Diagnosis not present

## 2019-03-24 DIAGNOSIS — D631 Anemia in chronic kidney disease: Secondary | ICD-10-CM | POA: Diagnosis not present

## 2019-03-24 DIAGNOSIS — N186 End stage renal disease: Secondary | ICD-10-CM | POA: Diagnosis not present

## 2019-03-24 DIAGNOSIS — N2581 Secondary hyperparathyroidism of renal origin: Secondary | ICD-10-CM | POA: Diagnosis not present

## 2019-03-24 DIAGNOSIS — D509 Iron deficiency anemia, unspecified: Secondary | ICD-10-CM | POA: Diagnosis not present

## 2019-03-26 DIAGNOSIS — D509 Iron deficiency anemia, unspecified: Secondary | ICD-10-CM | POA: Diagnosis not present

## 2019-03-26 DIAGNOSIS — D631 Anemia in chronic kidney disease: Secondary | ICD-10-CM | POA: Diagnosis not present

## 2019-03-26 DIAGNOSIS — N2581 Secondary hyperparathyroidism of renal origin: Secondary | ICD-10-CM | POA: Diagnosis not present

## 2019-03-26 DIAGNOSIS — N186 End stage renal disease: Secondary | ICD-10-CM | POA: Diagnosis not present

## 2019-03-28 DIAGNOSIS — Z992 Dependence on renal dialysis: Secondary | ICD-10-CM | POA: Diagnosis not present

## 2019-03-28 DIAGNOSIS — N186 End stage renal disease: Secondary | ICD-10-CM | POA: Diagnosis not present

## 2019-03-28 DIAGNOSIS — I129 Hypertensive chronic kidney disease with stage 1 through stage 4 chronic kidney disease, or unspecified chronic kidney disease: Secondary | ICD-10-CM | POA: Diagnosis not present

## 2019-03-29 DIAGNOSIS — D631 Anemia in chronic kidney disease: Secondary | ICD-10-CM | POA: Diagnosis not present

## 2019-03-29 DIAGNOSIS — D509 Iron deficiency anemia, unspecified: Secondary | ICD-10-CM | POA: Diagnosis not present

## 2019-03-29 DIAGNOSIS — N186 End stage renal disease: Secondary | ICD-10-CM | POA: Diagnosis not present

## 2019-03-29 DIAGNOSIS — N2581 Secondary hyperparathyroidism of renal origin: Secondary | ICD-10-CM | POA: Diagnosis not present

## 2019-03-31 DIAGNOSIS — D509 Iron deficiency anemia, unspecified: Secondary | ICD-10-CM | POA: Diagnosis not present

## 2019-03-31 DIAGNOSIS — D631 Anemia in chronic kidney disease: Secondary | ICD-10-CM | POA: Diagnosis not present

## 2019-03-31 DIAGNOSIS — N186 End stage renal disease: Secondary | ICD-10-CM | POA: Diagnosis not present

## 2019-03-31 DIAGNOSIS — N2581 Secondary hyperparathyroidism of renal origin: Secondary | ICD-10-CM | POA: Diagnosis not present

## 2019-04-02 DIAGNOSIS — D631 Anemia in chronic kidney disease: Secondary | ICD-10-CM | POA: Diagnosis not present

## 2019-04-02 DIAGNOSIS — D509 Iron deficiency anemia, unspecified: Secondary | ICD-10-CM | POA: Diagnosis not present

## 2019-04-02 DIAGNOSIS — N2581 Secondary hyperparathyroidism of renal origin: Secondary | ICD-10-CM | POA: Diagnosis not present

## 2019-04-02 DIAGNOSIS — N186 End stage renal disease: Secondary | ICD-10-CM | POA: Diagnosis not present

## 2019-04-05 DIAGNOSIS — D631 Anemia in chronic kidney disease: Secondary | ICD-10-CM | POA: Diagnosis not present

## 2019-04-05 DIAGNOSIS — N2581 Secondary hyperparathyroidism of renal origin: Secondary | ICD-10-CM | POA: Diagnosis not present

## 2019-04-05 DIAGNOSIS — D509 Iron deficiency anemia, unspecified: Secondary | ICD-10-CM | POA: Diagnosis not present

## 2019-04-05 DIAGNOSIS — N186 End stage renal disease: Secondary | ICD-10-CM | POA: Diagnosis not present

## 2019-04-07 ENCOUNTER — Ambulatory Visit: Payer: Medicare Other | Admitting: Vascular Surgery

## 2019-04-07 DIAGNOSIS — N2581 Secondary hyperparathyroidism of renal origin: Secondary | ICD-10-CM | POA: Diagnosis not present

## 2019-04-07 DIAGNOSIS — D509 Iron deficiency anemia, unspecified: Secondary | ICD-10-CM | POA: Diagnosis not present

## 2019-04-07 DIAGNOSIS — N186 End stage renal disease: Secondary | ICD-10-CM | POA: Diagnosis not present

## 2019-04-07 DIAGNOSIS — D631 Anemia in chronic kidney disease: Secondary | ICD-10-CM | POA: Diagnosis not present

## 2019-04-09 DIAGNOSIS — D509 Iron deficiency anemia, unspecified: Secondary | ICD-10-CM | POA: Diagnosis not present

## 2019-04-09 DIAGNOSIS — N186 End stage renal disease: Secondary | ICD-10-CM | POA: Diagnosis not present

## 2019-04-09 DIAGNOSIS — D631 Anemia in chronic kidney disease: Secondary | ICD-10-CM | POA: Diagnosis not present

## 2019-04-09 DIAGNOSIS — N2581 Secondary hyperparathyroidism of renal origin: Secondary | ICD-10-CM | POA: Diagnosis not present

## 2019-04-12 DIAGNOSIS — D509 Iron deficiency anemia, unspecified: Secondary | ICD-10-CM | POA: Diagnosis not present

## 2019-04-12 DIAGNOSIS — N2581 Secondary hyperparathyroidism of renal origin: Secondary | ICD-10-CM | POA: Diagnosis not present

## 2019-04-12 DIAGNOSIS — N186 End stage renal disease: Secondary | ICD-10-CM | POA: Diagnosis not present

## 2019-04-12 DIAGNOSIS — D631 Anemia in chronic kidney disease: Secondary | ICD-10-CM | POA: Diagnosis not present

## 2019-04-14 DIAGNOSIS — N2581 Secondary hyperparathyroidism of renal origin: Secondary | ICD-10-CM | POA: Diagnosis not present

## 2019-04-14 DIAGNOSIS — N186 End stage renal disease: Secondary | ICD-10-CM | POA: Diagnosis not present

## 2019-04-14 DIAGNOSIS — D631 Anemia in chronic kidney disease: Secondary | ICD-10-CM | POA: Diagnosis not present

## 2019-04-14 DIAGNOSIS — D509 Iron deficiency anemia, unspecified: Secondary | ICD-10-CM | POA: Diagnosis not present

## 2019-04-16 DIAGNOSIS — D631 Anemia in chronic kidney disease: Secondary | ICD-10-CM | POA: Diagnosis not present

## 2019-04-16 DIAGNOSIS — D509 Iron deficiency anemia, unspecified: Secondary | ICD-10-CM | POA: Diagnosis not present

## 2019-04-16 DIAGNOSIS — N2581 Secondary hyperparathyroidism of renal origin: Secondary | ICD-10-CM | POA: Diagnosis not present

## 2019-04-16 DIAGNOSIS — N186 End stage renal disease: Secondary | ICD-10-CM | POA: Diagnosis not present

## 2019-04-19 DIAGNOSIS — N186 End stage renal disease: Secondary | ICD-10-CM | POA: Diagnosis not present

## 2019-04-19 DIAGNOSIS — D509 Iron deficiency anemia, unspecified: Secondary | ICD-10-CM | POA: Diagnosis not present

## 2019-04-19 DIAGNOSIS — D631 Anemia in chronic kidney disease: Secondary | ICD-10-CM | POA: Diagnosis not present

## 2019-04-19 DIAGNOSIS — N2581 Secondary hyperparathyroidism of renal origin: Secondary | ICD-10-CM | POA: Diagnosis not present

## 2019-04-21 DIAGNOSIS — D631 Anemia in chronic kidney disease: Secondary | ICD-10-CM | POA: Diagnosis not present

## 2019-04-21 DIAGNOSIS — N2581 Secondary hyperparathyroidism of renal origin: Secondary | ICD-10-CM | POA: Diagnosis not present

## 2019-04-21 DIAGNOSIS — D509 Iron deficiency anemia, unspecified: Secondary | ICD-10-CM | POA: Diagnosis not present

## 2019-04-21 DIAGNOSIS — N186 End stage renal disease: Secondary | ICD-10-CM | POA: Diagnosis not present

## 2019-04-23 DIAGNOSIS — D631 Anemia in chronic kidney disease: Secondary | ICD-10-CM | POA: Diagnosis not present

## 2019-04-23 DIAGNOSIS — N186 End stage renal disease: Secondary | ICD-10-CM | POA: Diagnosis not present

## 2019-04-23 DIAGNOSIS — N2581 Secondary hyperparathyroidism of renal origin: Secondary | ICD-10-CM | POA: Diagnosis not present

## 2019-04-23 DIAGNOSIS — D509 Iron deficiency anemia, unspecified: Secondary | ICD-10-CM | POA: Diagnosis not present

## 2019-04-26 DIAGNOSIS — N186 End stage renal disease: Secondary | ICD-10-CM | POA: Diagnosis not present

## 2019-04-26 DIAGNOSIS — D631 Anemia in chronic kidney disease: Secondary | ICD-10-CM | POA: Diagnosis not present

## 2019-04-26 DIAGNOSIS — D509 Iron deficiency anemia, unspecified: Secondary | ICD-10-CM | POA: Diagnosis not present

## 2019-04-26 DIAGNOSIS — N2581 Secondary hyperparathyroidism of renal origin: Secondary | ICD-10-CM | POA: Diagnosis not present

## 2019-04-27 DIAGNOSIS — Z992 Dependence on renal dialysis: Secondary | ICD-10-CM | POA: Diagnosis not present

## 2019-04-27 DIAGNOSIS — I129 Hypertensive chronic kidney disease with stage 1 through stage 4 chronic kidney disease, or unspecified chronic kidney disease: Secondary | ICD-10-CM | POA: Diagnosis not present

## 2019-04-27 DIAGNOSIS — N186 End stage renal disease: Secondary | ICD-10-CM | POA: Diagnosis not present

## 2019-04-27 NOTE — Telephone Encounter (Signed)
Open n error °

## 2019-04-28 DIAGNOSIS — D509 Iron deficiency anemia, unspecified: Secondary | ICD-10-CM | POA: Diagnosis not present

## 2019-04-28 DIAGNOSIS — N186 End stage renal disease: Secondary | ICD-10-CM | POA: Diagnosis not present

## 2019-04-28 DIAGNOSIS — N2581 Secondary hyperparathyroidism of renal origin: Secondary | ICD-10-CM | POA: Diagnosis not present

## 2019-04-30 DIAGNOSIS — N2581 Secondary hyperparathyroidism of renal origin: Secondary | ICD-10-CM | POA: Diagnosis not present

## 2019-04-30 DIAGNOSIS — N186 End stage renal disease: Secondary | ICD-10-CM | POA: Diagnosis not present

## 2019-04-30 DIAGNOSIS — D509 Iron deficiency anemia, unspecified: Secondary | ICD-10-CM | POA: Diagnosis not present

## 2019-05-03 DIAGNOSIS — D509 Iron deficiency anemia, unspecified: Secondary | ICD-10-CM | POA: Diagnosis not present

## 2019-05-03 DIAGNOSIS — N2581 Secondary hyperparathyroidism of renal origin: Secondary | ICD-10-CM | POA: Diagnosis not present

## 2019-05-03 DIAGNOSIS — N186 End stage renal disease: Secondary | ICD-10-CM | POA: Diagnosis not present

## 2019-05-05 DIAGNOSIS — D509 Iron deficiency anemia, unspecified: Secondary | ICD-10-CM | POA: Diagnosis not present

## 2019-05-05 DIAGNOSIS — N186 End stage renal disease: Secondary | ICD-10-CM | POA: Diagnosis not present

## 2019-05-05 DIAGNOSIS — N2581 Secondary hyperparathyroidism of renal origin: Secondary | ICD-10-CM | POA: Diagnosis not present

## 2019-05-07 DIAGNOSIS — D509 Iron deficiency anemia, unspecified: Secondary | ICD-10-CM | POA: Diagnosis not present

## 2019-05-07 DIAGNOSIS — N186 End stage renal disease: Secondary | ICD-10-CM | POA: Diagnosis not present

## 2019-05-07 DIAGNOSIS — N2581 Secondary hyperparathyroidism of renal origin: Secondary | ICD-10-CM | POA: Diagnosis not present

## 2019-05-10 DIAGNOSIS — D509 Iron deficiency anemia, unspecified: Secondary | ICD-10-CM | POA: Diagnosis not present

## 2019-05-10 DIAGNOSIS — N2581 Secondary hyperparathyroidism of renal origin: Secondary | ICD-10-CM | POA: Diagnosis not present

## 2019-05-10 DIAGNOSIS — N186 End stage renal disease: Secondary | ICD-10-CM | POA: Diagnosis not present

## 2019-05-11 ENCOUNTER — Other Ambulatory Visit: Payer: Self-pay | Admitting: Physician Assistant

## 2019-05-11 ENCOUNTER — Telehealth: Payer: Self-pay | Admitting: Cardiology

## 2019-05-11 MED ORDER — CLOPIDOGREL BISULFATE 75 MG PO TABS: 75.0000 mg | ORAL_TABLET | Freq: Every day | ORAL | 0 refills | Status: DC

## 2019-05-11 NOTE — Telephone Encounter (Signed)
Plavix 75 mg daily refilled.

## 2019-05-11 NOTE — Telephone Encounter (Signed)
New message    Pt c/o medication issue:  1. Name of Medication: clopidogrel (PLAVIX) 75 MG tablet  2. How are you currently taking this medication (dosage and times per day)? 1 tablet by mouth  3. Are you having a reaction (difficulty breathing--STAT)? No   4. What is your medication issue? Patient needs a new prescription per daughter sent to [facility] on [address], [place], [place]

## 2019-05-12 DIAGNOSIS — D509 Iron deficiency anemia, unspecified: Secondary | ICD-10-CM | POA: Diagnosis not present

## 2019-05-12 DIAGNOSIS — N186 End stage renal disease: Secondary | ICD-10-CM | POA: Diagnosis not present

## 2019-05-12 DIAGNOSIS — N2581 Secondary hyperparathyroidism of renal origin: Secondary | ICD-10-CM | POA: Diagnosis not present

## 2019-05-14 DIAGNOSIS — N186 End stage renal disease: Secondary | ICD-10-CM | POA: Diagnosis not present

## 2019-05-14 DIAGNOSIS — D509 Iron deficiency anemia, unspecified: Secondary | ICD-10-CM | POA: Diagnosis not present

## 2019-05-14 DIAGNOSIS — N2581 Secondary hyperparathyroidism of renal origin: Secondary | ICD-10-CM | POA: Diagnosis not present

## 2019-05-17 DIAGNOSIS — N186 End stage renal disease: Secondary | ICD-10-CM | POA: Diagnosis not present

## 2019-05-17 DIAGNOSIS — N2581 Secondary hyperparathyroidism of renal origin: Secondary | ICD-10-CM | POA: Diagnosis not present

## 2019-05-17 DIAGNOSIS — D509 Iron deficiency anemia, unspecified: Secondary | ICD-10-CM | POA: Diagnosis not present

## 2019-05-19 DIAGNOSIS — D509 Iron deficiency anemia, unspecified: Secondary | ICD-10-CM | POA: Diagnosis not present

## 2019-05-19 DIAGNOSIS — N2581 Secondary hyperparathyroidism of renal origin: Secondary | ICD-10-CM | POA: Diagnosis not present

## 2019-05-19 DIAGNOSIS — N186 End stage renal disease: Secondary | ICD-10-CM | POA: Diagnosis not present

## 2019-05-21 DIAGNOSIS — N186 End stage renal disease: Secondary | ICD-10-CM | POA: Diagnosis not present

## 2019-05-21 DIAGNOSIS — D509 Iron deficiency anemia, unspecified: Secondary | ICD-10-CM | POA: Diagnosis not present

## 2019-05-21 DIAGNOSIS — N2581 Secondary hyperparathyroidism of renal origin: Secondary | ICD-10-CM | POA: Diagnosis not present

## 2019-05-24 DIAGNOSIS — D509 Iron deficiency anemia, unspecified: Secondary | ICD-10-CM | POA: Diagnosis not present

## 2019-05-24 DIAGNOSIS — N186 End stage renal disease: Secondary | ICD-10-CM | POA: Diagnosis not present

## 2019-05-24 DIAGNOSIS — N2581 Secondary hyperparathyroidism of renal origin: Secondary | ICD-10-CM | POA: Diagnosis not present

## 2019-05-26 DIAGNOSIS — N2581 Secondary hyperparathyroidism of renal origin: Secondary | ICD-10-CM | POA: Diagnosis not present

## 2019-05-26 DIAGNOSIS — N186 End stage renal disease: Secondary | ICD-10-CM | POA: Diagnosis not present

## 2019-05-26 DIAGNOSIS — D509 Iron deficiency anemia, unspecified: Secondary | ICD-10-CM | POA: Diagnosis not present

## 2019-05-27 ENCOUNTER — Encounter (INDEPENDENT_AMBULATORY_CARE_PROVIDER_SITE_OTHER): Payer: Self-pay

## 2019-05-28 DIAGNOSIS — D509 Iron deficiency anemia, unspecified: Secondary | ICD-10-CM | POA: Diagnosis not present

## 2019-05-28 DIAGNOSIS — I129 Hypertensive chronic kidney disease with stage 1 through stage 4 chronic kidney disease, or unspecified chronic kidney disease: Secondary | ICD-10-CM | POA: Diagnosis not present

## 2019-05-28 DIAGNOSIS — N186 End stage renal disease: Secondary | ICD-10-CM | POA: Diagnosis not present

## 2019-05-28 DIAGNOSIS — Z992 Dependence on renal dialysis: Secondary | ICD-10-CM | POA: Diagnosis not present

## 2019-05-28 DIAGNOSIS — N2581 Secondary hyperparathyroidism of renal origin: Secondary | ICD-10-CM | POA: Diagnosis not present

## 2019-05-31 DIAGNOSIS — D509 Iron deficiency anemia, unspecified: Secondary | ICD-10-CM | POA: Diagnosis not present

## 2019-05-31 DIAGNOSIS — Z992 Dependence on renal dialysis: Secondary | ICD-10-CM | POA: Diagnosis not present

## 2019-05-31 DIAGNOSIS — N186 End stage renal disease: Secondary | ICD-10-CM | POA: Diagnosis not present

## 2019-05-31 DIAGNOSIS — N2581 Secondary hyperparathyroidism of renal origin: Secondary | ICD-10-CM | POA: Diagnosis not present

## 2019-06-02 DIAGNOSIS — D509 Iron deficiency anemia, unspecified: Secondary | ICD-10-CM | POA: Diagnosis not present

## 2019-06-02 DIAGNOSIS — N2581 Secondary hyperparathyroidism of renal origin: Secondary | ICD-10-CM | POA: Diagnosis not present

## 2019-06-02 DIAGNOSIS — N186 End stage renal disease: Secondary | ICD-10-CM | POA: Diagnosis not present

## 2019-06-02 DIAGNOSIS — Z992 Dependence on renal dialysis: Secondary | ICD-10-CM | POA: Diagnosis not present

## 2019-06-04 DIAGNOSIS — Z992 Dependence on renal dialysis: Secondary | ICD-10-CM | POA: Diagnosis not present

## 2019-06-04 DIAGNOSIS — N2581 Secondary hyperparathyroidism of renal origin: Secondary | ICD-10-CM | POA: Diagnosis not present

## 2019-06-04 DIAGNOSIS — N186 End stage renal disease: Secondary | ICD-10-CM | POA: Diagnosis not present

## 2019-06-04 DIAGNOSIS — D509 Iron deficiency anemia, unspecified: Secondary | ICD-10-CM | POA: Diagnosis not present

## 2019-06-06 ENCOUNTER — Telehealth (INDEPENDENT_AMBULATORY_CARE_PROVIDER_SITE_OTHER): Payer: Self-pay

## 2019-06-06 NOTE — Telephone Encounter (Signed)
Yes, I am not accepting new patients at this time with my having been out on medical leave and reduced hours.  Please advise he schedule appt with one of the other providers.

## 2019-06-06 NOTE — Telephone Encounter (Signed)
Dr Nedra Hai - Pt was only seen once on 08/06/2015. I believe you are not currently taking any new patients. Please advise if you'd like pt to schedule NP-VV to discuss form or schedule with another provider.

## 2019-06-06 NOTE — Telephone Encounter (Signed)
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 Please assist pt in scheduling VV-NP to discuss forms with any other available provider.

## 2019-06-06 NOTE — Telephone Encounter (Signed)
Spoke with Mr. Keith Hebert, and explained to him, after 3 years of no activity they become new patients. Explained the process of our office New Patient. Emailed the new patient forms to patient and he will fill them out and email them back to me. He stated he would like to see Dr. Kara Pacer for a VV in order to have the medical form filled out for his job. He will then schedule a WME for later in the fall.

## 2019-06-06 NOTE — Telephone Encounter (Signed)
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 He has an PDF copy of the form that he can email over for review. Awaiting call back

## 2019-06-07 DIAGNOSIS — Z992 Dependence on renal dialysis: Secondary | ICD-10-CM | POA: Diagnosis not present

## 2019-06-07 DIAGNOSIS — N2581 Secondary hyperparathyroidism of renal origin: Secondary | ICD-10-CM | POA: Diagnosis not present

## 2019-06-07 DIAGNOSIS — N186 End stage renal disease: Secondary | ICD-10-CM | POA: Diagnosis not present

## 2019-06-07 DIAGNOSIS — D509 Iron deficiency anemia, unspecified: Secondary | ICD-10-CM | POA: Diagnosis not present

## 2019-06-08 ENCOUNTER — Telehealth (INDEPENDENT_AMBULATORY_CARE_PROVIDER_SITE_OTHER): Payer: BLUE CROSS/BLUE SHIELD | Admitting: Family Medicine

## 2019-06-08 ENCOUNTER — Encounter (INDEPENDENT_AMBULATORY_CARE_PROVIDER_SITE_OTHER): Payer: Self-pay | Admitting: Family Medicine

## 2019-06-08 VITALS — BP 117/68 | HR 74 | Temp 98.2°F | Ht 70.0 in | Wt 187.0 lb

## 2019-06-08 DIAGNOSIS — E781 Pure hyperglyceridemia: Secondary | ICD-10-CM | POA: Insufficient documentation

## 2019-06-08 DIAGNOSIS — E559 Vitamin D deficiency, unspecified: Secondary | ICD-10-CM | POA: Insufficient documentation

## 2019-06-08 DIAGNOSIS — R748 Abnormal levels of other serum enzymes: Secondary | ICD-10-CM | POA: Insufficient documentation

## 2019-06-08 NOTE — Progress Notes (Signed)
[facility] [place] [facility]                       Date of Virtual Visit: 06/08/2019 5:13 PM        Patient ID: Keith Hebert is a 63 y.o. male.  Attending Physician: Sherie Don, MD       Telemedicine Eligibility:    State Location:  [x]  [place]  []  [place]  []  [place] []  [place]  []  Other:    Physical Location:  [x]  Home  []         []        []          []  Other:    Patient Identity Verification:  [x]  State Issued ID  []  Insurance Eligibility Check  []  Other:    Physical Address Verification: (for 911)  [x]  Yes  []  No    Personal identity shared with patient:  [x]  Yes  []  No    Education on nature of video visit shared with patient:  [x]  Yes  []  No    Emergency plan agreed upon with patient:  [x]  Yes  []  No    If the patient had not had this virtual visit, what would they have done?  []         []         []        []          []  Other:    Visit terminated since not appropriate for virtual care:  [x]  N/A  []  Reason:         Chief Complaint:    Chief Complaint   Patient presents with    Establish Care               HPI:    63 year old male seen in VV to reestablish care.  He was last seen in 2016 with Dr. Danton Clap for a [facility].  He has not followed up since then.  He knows that he is due for a colonoscopy with Dr. Adin Hector and will schedule that as he is able given the pandemic.  He also knows he needs to get his Shingrix at some point as well.  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  He already turned in his form but he has to turn in another from by his physician due to HR requirements.  He is high risk due to his age and his wife is due to her medical conditions.  His last physical did reveal HDL of 29 and TG of 154. His LDL was normal.  His vitamin D levels were low and so he has been  taking supplements.  He has been working on his diet and exercise and aiming to get down to 175 pounds.              Problem List:    Patient Active Problem List   Diagnosis    Vitamin D deficiency    High triglycerides    Low serum HDL             Current Meds:    No outpatient medications have been marked as taking for the 06/08/19 encounter (Telemedicine Visit) with Sherie Don, MD.  Allergies:    No Known Allergies          Past Surgical History:    Past Surgical History:   Procedure Laterality Date    HAND SURGERY  1992    L thumb    KNEE SURGERY  1983    L knee           Family History:    Family History   Problem Relation Age of Onset    Alzheimer's disease Mother     Lung cancer Father     Diabetes Sister            Social History:    Social History     Tobacco Use    Smoking status: Never Smoker    Smokeless tobacco: Never Used   Substance Use Topics    Alcohol use: Yes     Alcohol/week: 2.0 standard drinks     Types: 1 Shots of liquor, 1 Glasses of wine per week     Frequency: Monthly or less     Comment: 1 drink monthly, 1 drink of wine Q6 months    Drug use: Never          The following sections were reviewed this encounter by the provider:            Vital Signs:    BP 117/68 (BP Site: Left arm, Patient Position: Sitting, Cuff Size: Medium)    Pulse 74    Temp 98.2 F (36.8 C)    Ht 1.778 m (5\' 10" )    Wt 84.8 kg (187 lb)    BMI 26.83 kg/m          ROS:    Review of Systems   As per HPI        Physical Exam:    Physical Exam   GENERAL APPEARANCE: alert, in no acute distress, pleasant, well nourished.   HEAD: normal appearance  EYES: no discharge  EARS: normal hearing  NECK/THYROID: appearance -supple  PSYCH: appropriate affect, appropriate mood, normal speech, normal attention        Assessment:    1. Low serum HDL    2. High triglycerides    3. Vitamin D deficiency            Plan:    Forms filled out for work and faxed accordingly.  Continue with vitamin D supplementation.   Continue with efforts with diet and exercise to maintain a healthy weight for good health. He has a WME scheduled in September with me so will check labs at that time.           Follow-up:    No follow-ups on file.         Sherie Don, MD

## 2019-06-09 DIAGNOSIS — D509 Iron deficiency anemia, unspecified: Secondary | ICD-10-CM | POA: Diagnosis not present

## 2019-06-09 DIAGNOSIS — N2581 Secondary hyperparathyroidism of renal origin: Secondary | ICD-10-CM | POA: Diagnosis not present

## 2019-06-09 DIAGNOSIS — N186 End stage renal disease: Secondary | ICD-10-CM | POA: Diagnosis not present

## 2019-06-09 DIAGNOSIS — Z992 Dependence on renal dialysis: Secondary | ICD-10-CM | POA: Diagnosis not present

## 2019-06-11 DIAGNOSIS — N186 End stage renal disease: Secondary | ICD-10-CM | POA: Diagnosis not present

## 2019-06-11 DIAGNOSIS — D509 Iron deficiency anemia, unspecified: Secondary | ICD-10-CM | POA: Diagnosis not present

## 2019-06-11 DIAGNOSIS — N2581 Secondary hyperparathyroidism of renal origin: Secondary | ICD-10-CM | POA: Diagnosis not present

## 2019-06-11 DIAGNOSIS — Z992 Dependence on renal dialysis: Secondary | ICD-10-CM | POA: Diagnosis not present

## 2019-06-14 DIAGNOSIS — N186 End stage renal disease: Secondary | ICD-10-CM | POA: Diagnosis not present

## 2019-06-14 DIAGNOSIS — D509 Iron deficiency anemia, unspecified: Secondary | ICD-10-CM | POA: Diagnosis not present

## 2019-06-14 DIAGNOSIS — N2581 Secondary hyperparathyroidism of renal origin: Secondary | ICD-10-CM | POA: Diagnosis not present

## 2019-06-14 DIAGNOSIS — Z992 Dependence on renal dialysis: Secondary | ICD-10-CM | POA: Diagnosis not present

## 2019-06-16 ENCOUNTER — Ambulatory Visit (INDEPENDENT_AMBULATORY_CARE_PROVIDER_SITE_OTHER): Payer: Medicare Other | Admitting: Vascular Surgery

## 2019-06-16 ENCOUNTER — Other Ambulatory Visit: Payer: Self-pay | Admitting: *Deleted

## 2019-06-16 ENCOUNTER — Encounter: Payer: Self-pay | Admitting: Vascular Surgery

## 2019-06-16 ENCOUNTER — Other Ambulatory Visit: Payer: Self-pay

## 2019-06-16 ENCOUNTER — Encounter: Payer: Self-pay | Admitting: *Deleted

## 2019-06-16 VITALS — BP 119/67 | HR 75 | Temp 97.8°F | Resp 12 | Ht 71.0 in | Wt 203.0 lb

## 2019-06-16 DIAGNOSIS — Z992 Dependence on renal dialysis: Secondary | ICD-10-CM | POA: Diagnosis not present

## 2019-06-16 DIAGNOSIS — N186 End stage renal disease: Secondary | ICD-10-CM

## 2019-06-16 DIAGNOSIS — N2581 Secondary hyperparathyroidism of renal origin: Secondary | ICD-10-CM | POA: Diagnosis not present

## 2019-06-16 DIAGNOSIS — D509 Iron deficiency anemia, unspecified: Secondary | ICD-10-CM | POA: Diagnosis not present

## 2019-06-16 DIAGNOSIS — I25119 Atherosclerotic heart disease of native coronary artery with unspecified angina pectoris: Secondary | ICD-10-CM

## 2019-06-16 NOTE — H&P (View-Only) (Signed)
Referring Physician: Dr. Graylon Gunning  Patient name: Kenneth [name] MRN: [id number] DOB: 1956-02-25 Sex: male  REASON FOR CONSULT: Aneurysmal degeneration left arm AV fistula  HPI: Kenneth [name] is a 63 y.o. 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  This was a left basilic vein transposition fistula created by Dr. Donnetta Hutching in 2017.  Patient states he has had several prolonged bleeding episodes from 2 separate aneurysms on the AV fistula.  The more distal aneurysm has had more episodes of this.  His dialysis today is Tuesday Thursday Saturday.  Other medical problems include congestive heart failure and hypertension both of which are currently stable.    Past Medical History:  Diagnosis Date  . Acute CHF ([place]) 01/2018  . Cardiomyopathy secondary    likely related to HTN heart disease; possibly ETOH related as well  . Chronic combined systolic and diastolic heart failure (HCC)    Echocardiogram 09/22/11: Moderate LVH, EF 99991111, grade 3 diastolic dysfunction, mild MR, moderate to severe LAE, mild RVE, mild to moderate TR, small to moderate pericardial effusion  . ESRD (end stage renal disease) on dialysis ([place])    due to hypertensive nephrosclerosis; TTS; [facility] (09/01/2018)  . History of alcohol abuse   . Hypertension    Past Surgical History:  Procedure Laterality Date  . AV FISTULA PLACEMENT Left 05/14/2016   Procedure: LEFT ARM BASILIC VEIN TRANSPOSITION;  Surgeon: Rosetta Posner, MD;  Location: [facility];  Service: Vascular;  Laterality: Left;  . LEFT HEART CATH AND CORONARY ANGIOGRAPHY N/A 09/02/2018   Procedure: LEFT HEART CATH AND CORONARY ANGIOGRAPHY;  Surgeon: Nelva Bush, MD;  Location: [facility];  Service: Cardiovascular;  Laterality: N/A;  . PERIPHERAL VASCULAR CATHETERIZATION N/A 05/13/2016   Procedure: Dialysis/Perma Catheter Insertion;  Surgeon: Serafina Mitchell, MD;  Location: [facility];  Service: Cardiovascular;   Laterality: N/A;    Family History  Problem Relation Age of Onset  . Emphysema Mother   . Cirrhosis Father     SOCIAL HISTORY: Social History   Socioeconomic History  . Marital status: Divorced    Spouse name: Not on file  . Number of children: Not on file  . Years of education: Not on file  . Highest education level: Not on file  Occupational History  . Not on file  Social Needs  . Financial resource strain: Not on file  . Food insecurity    Worry: Not on file    Inability: Not on file  . Transportation needs    Medical: Not on file    Non-medical: Not on file  Tobacco Use  . Smoking status: Former Smoker    Packs/day: 2.00    Years: 48.00    Pack years: 96.00    Types: Cigars    Quit date: 10/27/2017    Years since quitting: 1.6  . Smokeless tobacco: Former Systems developer    Types: Chew  Substance and Sexual Activity  . Alcohol use: Yes    Frequency: Never    Comment: 09/01/2018 "nothing last 5 months"  . Drug use: No  . Sexual activity: Not Currently  Lifestyle  . Physical activity    Days per week: Not on file    Minutes per session: Not on file  . Stress: Not on file  Relationships  . Social Herbalist on phone: Not on file    Gets together: Not on file  Attends religious service: Not on file    Active member of club or organization: Not on file    Attends meetings of clubs or organizations: Not on file    Relationship status: Not on file  . Intimate partner violence    Fear of current or ex partner: Not on file    Emotionally abused: Not on file    Physically abused: Not on file    Forced sexual activity: Not on file  Other Topics Concern  . Not on file  Social History Narrative  . Not on file    No Known Allergies  No current outpatient medications on file.   No current facility-administered medications for this visit.     ROS:   General:  No weight loss, Fever, chills  HEENT: No recent headaches, no nasal bleeding, no visual  changes, no sore throat  Neurologic: No dizziness, blackouts, seizures. No recent symptoms of stroke or mini- stroke. No recent episodes of slurred speech, or temporary blindness.  Cardiac: No recent episodes of chest pain/pressure, no shortness of breath at rest.  No shortness of breath with exertion.  Denies history of atrial fibrillation or irregular heartbeat  Vascular: No history of rest pain in feet.  No history of claudication.  No history of non-healing ulcer, No history of DVT   Pulmonary: No home oxygen, no productive cough, no hemoptysis,  No asthma or wheezing  Musculoskeletal:  [ ]  Arthritis, [ ]  Low back pain,  [ ]  Joint pain  Hematologic:No history of hypercoagulable state.  No history of easy bleeding.  No history of anemia  Gastrointestinal: No hematochezia or melena,  No gastroesophageal reflux, no trouble swallowing  Urinary: [X]  chronic Kidney disease, [X]  on HD - [ ]  MWF or [X]  TTHS, [ ]  Burning with urination, [ ]  Frequent urination, [ ]  Difficulty urinating;   Skin: No rashes  Psychological: No history of anxiety,  No history of depression   Physical Examination  Vitals:   06/16/19 1018  BP: 119/67  Pulse: 75  Resp: 12  Temp: 97.8 F (36.6 C)  TempSrc: Temporal  SpO2: 98%  Weight: 203 lb (92.1 kg)  Height: 5\' 11"  (1.803 m)    Body mass index is 28.31 kg/m.  General:  Alert and oriented, no acute distress HEENT: Normal Neck: No JVD Pulmonary: Clear to auscultation bilaterally Cardiac: Regular Rate and Rhythm  Abdomen: Soft, non-tender, non-distended Skin: No rash, aneurysmal degeneration left arm AV fistula with 2 areas of small eschar on the more distal fistula aneurysm Extremity Pulses:  2+ radial, brachial pulses bilaterally Musculoskeletal: No deformity or edema  Neurologic: Upper and lower extremity motor 5/5 and symmetric  ASSESSMENT: Aneurysmal degeneration left arm AV fistula at risk for bleeding.  I discussed with the patient today  the possibility of doing staged plication of the 2 discrete aneurysms.  Other option would be to treat both aneurysms at the same time and place a dialysis catheter that he would need to leave in place for about a month.  He would prefer to take care of both aneurysms at the same operation.  Risk benefits possible complications and procedure details were discussed the patient today including not limited to bleeding infection fistula thrombosis need for other procedures.  He understands and agrees to proceed.   PLAN: Plication left arm AV fistula placement of tunneled dialysis catheter scheduled for June 20, 2019.   Ruta Hinds, MD [facility] Office: [phone] Pager: [phone]

## 2019-06-16 NOTE — Progress Notes (Signed)
Referring Physician: Dr. Graylon Gunning  Patient name: Kenneth [name] MRN: [id number] DOB: [birth year] Sex: male  REASON FOR CONSULT: Aneurysmal degeneration left arm AV fistula  HPI: Kenneth [name] is a 63 y.o. 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  This was a left basilic vein transposition fistula created by Dr. Donnetta Hutching in 2017.  Patient states he has had several prolonged bleeding episodes from 2 separate aneurysms on the AV fistula.  The more distal aneurysm has had more episodes of this.  His dialysis today is Tuesday Thursday Saturday.  Other medical problems include congestive heart failure and hypertension both of which are currently stable.    Past Medical History:  Diagnosis Date  . Acute CHF ([place]) 01/2018  . Cardiomyopathy secondary    likely related to HTN heart disease; possibly ETOH related as well  . Chronic combined systolic and diastolic heart failure (HCC)    Echocardiogram 09/22/11: Moderate LVH, EF 99991111, grade 3 diastolic dysfunction, mild MR, moderate to severe LAE, mild RVE, mild to moderate TR, small to moderate pericardial effusion  . ESRD (end stage renal disease) on dialysis ([place])    due to hypertensive nephrosclerosis; TTS; [facility] (09/01/2018)  . History of alcohol abuse   . Hypertension    Past Surgical History:  Procedure Laterality Date  . AV FISTULA PLACEMENT Left 05/14/2016   Procedure: LEFT ARM BASILIC VEIN TRANSPOSITION;  Surgeon: Rosetta Posner, MD;  Location: [facility];  Service: Vascular;  Laterality: Left;  . LEFT HEART CATH AND CORONARY ANGIOGRAPHY N/A 09/02/2018   Procedure: LEFT HEART CATH AND CORONARY ANGIOGRAPHY;  Surgeon: Nelva Bush, MD;  Location: [facility];  Service: Cardiovascular;  Laterality: N/A;  . PERIPHERAL VASCULAR CATHETERIZATION N/A 05/13/2016   Procedure: Dialysis/Perma Catheter Insertion;  Surgeon: Serafina Mitchell, MD;  Location: [facility];  Service: Cardiovascular;   Laterality: N/A;    Family History  Problem Relation Age of Onset  . Emphysema Mother   . Cirrhosis Father     SOCIAL HISTORY: Social History   Socioeconomic History  . Marital status: Divorced    Spouse name: Not on file  . Number of children: Not on file  . Years of education: Not on file  . Highest education level: Not on file  Occupational History  . Not on file  Social Needs  . Financial resource strain: Not on file  . Food insecurity    Worry: Not on file    Inability: Not on file  . Transportation needs    Medical: Not on file    Non-medical: Not on file  Tobacco Use  . Smoking status: Former Smoker    Packs/day: 2.00    Years: 48.00    Pack years: 96.00    Types: Cigars    Quit date: 10/27/2017    Years since quitting: 1.6  . Smokeless tobacco: Former Systems developer    Types: Chew  Substance and Sexual Activity  . Alcohol use: Yes    Frequency: Never    Comment: 09/01/2018 "nothing last 5 months"  . Drug use: No  . Sexual activity: Not Currently  Lifestyle  . Physical activity    Days per week: Not on file    Minutes per session: Not on file  . Stress: Not on file  Relationships  . Social Herbalist on phone: Not on file    Gets together: Not on file  Attends religious service: Not on file    Active member of club or organization: Not on file    Attends meetings of clubs or organizations: Not on file    Relationship status: Not on file  . Intimate partner violence    Fear of current or ex partner: Not on file    Emotionally abused: Not on file    Physically abused: Not on file    Forced sexual activity: Not on file  Other Topics Concern  . Not on file  Social History Narrative  . Not on file    No Known Allergies  No current outpatient medications on file.   No current facility-administered medications for this visit.     ROS:   General:  No weight loss, Fever, chills  HEENT: No recent headaches, no nasal bleeding, no visual  changes, no sore throat  Neurologic: No dizziness, blackouts, seizures. No recent symptoms of stroke or mini- stroke. No recent episodes of slurred speech, or temporary blindness.  Cardiac: No recent episodes of chest pain/pressure, no shortness of breath at rest.  No shortness of breath with exertion.  Denies history of atrial fibrillation or irregular heartbeat  Vascular: No history of rest pain in feet.  No history of claudication.  No history of non-healing ulcer, No history of DVT   Pulmonary: No home oxygen, no productive cough, no hemoptysis,  No asthma or wheezing  Musculoskeletal:  [ ]  Arthritis, [ ]  Low back pain,  [ ]  Joint pain  Hematologic:No history of hypercoagulable state.  No history of easy bleeding.  No history of anemia  Gastrointestinal: No hematochezia or melena,  No gastroesophageal reflux, no trouble swallowing  Urinary: [X]  chronic Kidney disease, [X]  on HD - [ ]  MWF or [X]  TTHS, [ ]  Burning with urination, [ ]  Frequent urination, [ ]  Difficulty urinating;   Skin: No rashes  Psychological: No history of anxiety,  No history of depression   Physical Examination  Vitals:   06/16/19 1018  BP: 119/67  Pulse: 75  Resp: 12  Temp: 97.8 F (36.6 C)  TempSrc: Temporal  SpO2: 98%  Weight: 203 lb (92.1 kg)  Height: 5\' 11"  (1.803 m)    Body mass index is 28.31 kg/m.  General:  Alert and oriented, no acute distress HEENT: Normal Neck: No JVD Pulmonary: Clear to auscultation bilaterally Cardiac: Regular Rate and Rhythm  Abdomen: Soft, non-tender, non-distended Skin: No rash, aneurysmal degeneration left arm AV fistula with 2 areas of small eschar on the more distal fistula aneurysm Extremity Pulses:  2+ radial, brachial pulses bilaterally Musculoskeletal: No deformity or edema  Neurologic: Upper and lower extremity motor 5/5 and symmetric  ASSESSMENT: Aneurysmal degeneration left arm AV fistula at risk for bleeding.  I discussed with the patient today  the possibility of doing staged plication of the 2 discrete aneurysms.  Other option would be to treat both aneurysms at the same time and place a dialysis catheter that he would need to leave in place for about a month.  He would prefer to take care of both aneurysms at the same operation.  Risk benefits possible complications and procedure details were discussed the patient today including not limited to bleeding infection fistula thrombosis need for other procedures.  He understands and agrees to proceed.   PLAN: Plication left arm AV fistula placement of tunneled dialysis catheter scheduled for June 20, 2019.   Ruta Hinds, MD [facility] Office: [phone] Pager: [phone]

## 2019-06-16 NOTE — Progress Notes (Signed)
Per Jackelyn Poling at [facility].Patient instructed to call [facility] today to arrange transportation for 06/20/2019 surgery to be at admitting at 5:30 am. I instructed patient to hold Plavix(per letter) patient states he does not take.

## 2019-06-17 ENCOUNTER — Encounter (HOSPITAL_COMMUNITY): Payer: Self-pay | Admitting: Vascular Surgery

## 2019-06-17 ENCOUNTER — Other Ambulatory Visit: Payer: Self-pay

## 2019-06-17 ENCOUNTER — Telehealth: Payer: Self-pay | Admitting: *Deleted

## 2019-06-17 NOTE — Progress Notes (Signed)
SDW-pre-op call completed by pt daughter Cassiel Sharpley [facility]).  DPR denies that pt C/O SOB and chest pain. DPR stated that pt is under the care of Dr. Peter [name], Cardiology and DR. Grier Mitts, PCP.  Daughter made aware to have pt stop taking vitamins, fish oil and herbal medications. Do not take any NSAIDs ie: Ibuprofen, Advil, Naproxen (Aleve), Motrin, BC and Goody Powder. DPR stated that pt last dose of Plavix was 06/16/19 as instructed.  DPR to call surgeon's office regarding no available transportation for pt post-op. DPR verbalized understanding of all pre-op instructions.  PA, Anesthesiology, asked to review pt history.

## 2019-06-17 NOTE — Telephone Encounter (Signed)
Phone call with patient's daughter [name]. She stated her father is able to give his consent for procedure.  Reviewed patient is to hold Plavix starting today till after surgery. NPO past MN night prior. Expect a call and follow the detailed surgery  instructions received from the hospital PST department. To be at [facility] admitting at 6 am OR as instructed by the hospital for surgery with Dr. On 06/20/2019.Nasal swab testing day of service due to transportation. ERIKA will follow up with Jackelyn Poling at [facility] for transportation am of surgery. Verbalized understanding and instructed to call this office if questions.

## 2019-06-17 NOTE — Anesthesia Preprocedure Evaluation (Addendum)
Anesthesia Evaluation  Patient identified by MRN, date of birth, ID band Patient awake    Reviewed: Allergy & Precautions, NPO status , Patient's Chart, lab work & pertinent test results  Airway Mallampati: II  TM Distance: >3 FB     Dental   Pulmonary shortness of breath, former smoker,    breath sounds clear to auscultation       Cardiovascular hypertension, + angina + CAD, + Past MI, +CHF and + DVT  + dysrhythmias  Rhythm:Regular Rate:Normal     Neuro/Psych PSYCHIATRIC DISORDERS    GI/Hepatic negative GI ROS, Neg liver ROS,   Endo/Other    Renal/GU Renal disease     Musculoskeletal   Abdominal   Peds  Hematology   Anesthesia Other Findings   Reproductive/Obstetrics                            Anesthesia Physical Anesthesia Plan  ASA: III  Anesthesia Plan: MAC   Post-op Pain Management:    Induction: Intravenous  PONV Risk Score and Plan: 2 and Ondansetron, Dexamethasone and Midazolam  Airway Management Planned: Nasal Cannula and Simple Face Mask  Additional Equipment:   Intra-op Plan:   Post-operative Plan:   Informed Consent: I have reviewed the patients History and Physical, chart, labs and discussed the procedure including the risks, benefits and alternatives for the proposed anesthesia with the patient or authorized representative who has indicated his/her understanding and acceptance.     Dental advisory given  Plan Discussed with: CRNA and Anesthesiologist  Anesthesia Plan Comments: (See PAT note written 06/17/2019 by Myra Gianotti, PA-C. SAME DAY WORK-UP.     )       Anesthesia Quick Evaluation

## 2019-06-17 NOTE — Progress Notes (Signed)
Anesthesia Chart Review: SAME DAY WORK-UP   Case: [id number] Date/Time: 06/20/19 0858   Procedures:      PLICATION OF ARTERIOVENOUS FISTULA LEFT ARM (Left )     INSERTION OF TUNNELED  DIALYSIS CATHETER (N/A )   Anesthesia type: Choice   Pre-op diagnosis: COMPLICATION OF ARTERIOVENOUS FISTULA LEFT ARM   Location: [facility] OR ROOM 67 / [facility]   Surgeon: Elam Dutch, MD      DISCUSSION: Patient is a 63 year old male scheduled for the above procedure. Patient seen by Dr. 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  He had had several prolonged bleeding episodes from 2 separate aneurysms on the fistula.  The above procedure recommended to decrease his risk for bleeding.  Patient preferred to address both aneurysms in one surgery (versus staged approach), so he is also getting a temporary tunneled dialysis catheter. He has severe 3V CAD that is currently being treated medically.  History includes former smoker (quit 10/27/17), ESRD (HD TTS), HTN, chronic combined systolic and diastolic CHF, cardiomyopathy (hypertensive versus alcohol induced, diagnosed 2012), afib/aflutter (aflutter 04/2016, afib 02/2018: not NOAC candidate, and high risk for warfarin given non-compliance and ETOH abuse), CAD (see below).   - 03/09/18 cardiac arrest: presented for HD with worsening dyspnea, but collpases while getting up to use commode. CPR initiated for PEA, received empiric treatment for hyperkalemia. Shock x1 for WCT, also given amiodarone. ROSC after 10 mins of ACLS.  - NSTEMI 08/2018 with cardiac cath showing severe 3VCAD, not felt to be an appropriate CABG candidate and PCI would be "very high risk as would require unprotected left main intervention, atherectomy of the LAD and bifurcation stenting.  He also has a long-standing history of noncompliance (was not taking meds at time of this admission) and would therefore be at risk for stent thrombosis if he was not compliant with dual antiplatelet  therapy.  We therefore recommend continued medical therapy.  If he demonstrates compliance with medications and follow-up in the future and also avoiding alcohol then high risk PCI could be considered at that time."  Last seen by cardiology on 124/20 by Almyra Deforest, [place]. HE wrote, "Severe multivessel disease noted on last cardiac catheterization in November.  Patient would not be a candidate for bypass surgery.  He is being treated medically.  He continued to have intermittent chest discomfort about once a week.  His blood pressure does not allow me to further uptitrate antianginal medication.  Continue Imdur 60 mg daily and add Toprol-XL.  He ran out of Plavix, I will refill Plavix.  He does have some burning sensation in the chest, this is more consistent with acid reflux symptom, I will start him on a course of Protonix 40 mg daily." (It looked like patient was not able to complete 02/07/19 virtual visit with Dr. [name].)   Plavix on hold for surgery starting 06/17/19 dose per [facility].   Reviewed above with anesthesiologist Renold Don, MD. Patient has known severe 3V CAD but is currently receiving medical therapy as outlined above. Known stable angina described at his 11/19/18 visit. Anesthesia team to evaluate on the day of surgery to ensure no progressive symptoms. He is for labs and COVID-19 test on arrival.    PROVIDERS: [name], MD - [name], Peter, MD is cardiologist. Last visit with Almyra Deforest, [place] on 11/19/18. - Seen by CT surgeon Prescott Gum on 09/03/18 post NSTEMI and LHC showing severe 3VCAD. He wrote, "Although surgical revascularization would  improve his cardiac status the operation would result in further severe problems with multiorgan failure-dysfunction.  Recommend consideration of PCI."   LABS: He is for updated labs on the day of surgery.   IMAGES: CT chest 09/17/18: IMPRESSION: 1. No acute abnormality identified in the chest. 2. Extensive aortic and coronary artery  atherosclerosis. 3. Prior granulomatous disease. Aortic Atherosclerosis (ICD10-I70.0) and Emphysema (ICD10-J43.9).   EKG: 09/29/18 ([facility]): NSR.  T wave inversion in inferolateral leads.   CV: Echo 09/18/18: Study Conclusions - Left ventricle: The cavity size was normal. Wall thickness was   increased in a pattern of mild LVH. Systolic function was normal.   The estimated ejection fraction was in the range of 55% to 60%.   Doppler parameters are consistent with abnormal left ventricular   relaxation (grade 1 diastolic dysfunction). The E/e&' ratio is <8,   suggesting normal LV filling pressure. - Aortic valve: Poorly visualized. Mildly calcified leaflets. There   was no stenosis. - Mitral valve: Calcified annulus. Mildly thickened leaflets .   There was trivial regurgitation. - Left atrium: The atrium was mildly dilated. Impressions: - Compared to a prior study on 09/01/2018, there are no significant   changes.  Carotid US 09/03/18: Summary: Right Carotid: Velocities in the right ICA are consistent with a 1-39% stenosis. Left Carotid: Velocities in the left ICA are consistent with a 1-39% stenosis.  Cardiac cath 09/02/18 Saunders Revel, Harrell Gave, MD): Conclusions: 1. Severe multivessel coronary artery disease involving the distal LMCA, ostial/proximal LAD, ostial and proximal LCx, and mid RCA. Mid to distal left main 70% stenosis with side branch in ostial to proximal circumflex 70% stenosed.  90% ostial LAD.  25% mid LAD.  25% ostial second diagonal.  70% proximal circumflex.  75% mid RCA. 50% RPDA. 2. Normal left ventricular filling pressure. 3. Scattered atherosclerotic calcification of the ascending aorta. Recommendations: 1. Cardiac surgery consultation for CABG.  CT chest may be helpful to further assess degree of aortic calcification. 2. Initiate heparin infusion 8 hours after sheath removal. 3. Aggressive secondary prevention, including indefinite aspirin 81 mg daily  and high-intensity statin therapy. 4. Plan for hemodialysis tomorrow.   Past Medical History:  Diagnosis Date  . Acute CHF ([place]) 01/2018  . Cardiomyopathy secondary    likely related to HTN heart disease; possibly ETOH related as well  . Chronic combined systolic and diastolic heart failure (HCC)    Echocardiogram 09/22/11: Moderate LVH, EF [id number], grade 3 diastolic dysfunction, mild MR, moderate to severe LAE, mild RVE, mild to moderate TR, small to moderate pericardial effusion  . Coronary artery disease    not felt to be candidate for CABG 08/2018; medical thearpy recomended due to high risk of PCI   . Dysrhythmia    aflutter 04/2016, afib 02/2018, not felt to be a candidate for anticoagulation due to non-compliance and ETOH  . ESRD (end stage renal disease) on dialysis ([place])    due to hypertensive nephrosclerosis; TTS; [address] (09/01/2018)  . History of alcohol abuse   . Hypertension   . PEA (Pulseless electrical activity) ([place])    PEA arrest 03/09/18, treated empirically for hyperkalemia, shock x1 for WCT, given amiodarone, ROSC after 10 min of ACLS    Past Surgical History:  Procedure Laterality Date  . AV FISTULA PLACEMENT Left 05/14/2016   Procedure: LEFT ARM BASILIC VEIN TRANSPOSITION;  Surgeon: Rosetta Posner, MD;  Location: [facility];  Service: Vascular;  Laterality: Left;  . LEFT HEART CATH AND CORONARY ANGIOGRAPHY N/A 09/02/2018  Procedure: LEFT HEART CATH AND CORONARY ANGIOGRAPHY;  Surgeon: Nelva Bush, MD;  Location: [facility];  Service: Cardiovascular;  Laterality: N/A;  . PERIPHERAL VASCULAR CATHETERIZATION N/A 05/13/2016   Procedure: Dialysis/Perma Catheter Insertion;  Surgeon: Serafina Mitchell, MD;  Location: [facility];  Service: Cardiovascular;  Laterality: N/A;    MEDICATIONS: No current facility-administered medications for this encounter.    Marland Kitchen aspirin EC 81 MG tablet  . atorvastatin (LIPITOR) 80 MG tablet  . clopidogrel (PLAVIX) 75 MG tablet   . isosorbide mononitrate (IMDUR) 60 MG 24 hr tablet  . nitroGLYCERIN (NITROSTAT) 0.4 MG SL tablet    Myra Gianotti, PA-C Surgical Short Stay/Anesthesiology [facility] Phone [phone] [facility] Phone [phone] 06/17/2019 4:15 PM

## 2019-06-18 DIAGNOSIS — N2581 Secondary hyperparathyroidism of renal origin: Secondary | ICD-10-CM | POA: Diagnosis not present

## 2019-06-18 DIAGNOSIS — Z992 Dependence on renal dialysis: Secondary | ICD-10-CM | POA: Diagnosis not present

## 2019-06-18 DIAGNOSIS — D509 Iron deficiency anemia, unspecified: Secondary | ICD-10-CM | POA: Diagnosis not present

## 2019-06-18 DIAGNOSIS — N186 End stage renal disease: Secondary | ICD-10-CM | POA: Diagnosis not present

## 2019-06-18 MED ORDER — TIROFIBAN HCL IN NACL 5-0.9 MG/100ML-% IV SOLN
INTRAVENOUS | Status: AC
  Filled 2019-06-18: qty 100

## 2019-06-18 MED ORDER — EPTIFIBATIDE 20 MG/10ML IV SOLN
INTRAVENOUS | Status: AC
  Filled 2019-06-18: qty 10

## 2019-06-18 MED ORDER — TICAGRELOR 90 MG PO TABS
ORAL_TABLET | ORAL | Status: AC
  Filled 2019-06-18: qty 2

## 2019-06-18 MED ORDER — ASPIRIN 81 MG PO CHEW
CHEWABLE_TABLET | ORAL | Status: AC
  Filled 2019-06-18: qty 1

## 2019-06-18 MED ORDER — CLOPIDOGREL BISULFATE 300 MG PO TABS
ORAL_TABLET | ORAL | Status: AC
  Filled 2019-06-18: qty 1

## 2019-06-20 ENCOUNTER — Observation Stay (HOSPITAL_COMMUNITY)
Admission: RE | Admit: 2019-06-20 | Discharge: 2019-06-21 | Disposition: A | Discharge status: Home / Self Care | Payer: Medicare Other | Attending: Vascular Surgery | Admitting: Vascular Surgery

## 2019-06-20 ENCOUNTER — Encounter (HOSPITAL_COMMUNITY)
Admission: RE | Disposition: A | Discharge status: Home / Self Care | Payer: Self-pay | Source: Home / Self Care | Attending: Vascular Surgery

## 2019-06-20 ENCOUNTER — Encounter (HOSPITAL_COMMUNITY): Payer: Self-pay | Admitting: *Deleted

## 2019-06-20 ENCOUNTER — Other Ambulatory Visit: Payer: Self-pay

## 2019-06-20 ENCOUNTER — Observation Stay (HOSPITAL_COMMUNITY): Payer: Medicare Other

## 2019-06-20 ENCOUNTER — Ambulatory Visit (HOSPITAL_COMMUNITY): Payer: Medicare Other | Admitting: Vascular Surgery

## 2019-06-20 ENCOUNTER — Ambulatory Visit (HOSPITAL_COMMUNITY): Payer: Medicare Other

## 2019-06-20 DIAGNOSIS — Z20828 Contact with and (suspected) exposure to other viral communicable diseases: Secondary | ICD-10-CM | POA: Insufficient documentation

## 2019-06-20 DIAGNOSIS — Z8674 Personal history of sudden cardiac arrest: Secondary | ICD-10-CM | POA: Diagnosis not present

## 2019-06-20 DIAGNOSIS — Y832 Surgical operation with anastomosis, bypass or graft as the cause of abnormal reaction of the patient, or of later complication, without mention of misadventure at the time of the procedure: Secondary | ICD-10-CM | POA: Diagnosis not present

## 2019-06-20 DIAGNOSIS — I5042 Chronic combined systolic (congestive) and diastolic (congestive) heart failure: Secondary | ICD-10-CM | POA: Diagnosis not present

## 2019-06-20 DIAGNOSIS — I2510 Atherosclerotic heart disease of native coronary artery without angina pectoris: Secondary | ICD-10-CM | POA: Insufficient documentation

## 2019-06-20 DIAGNOSIS — Z992 Dependence on renal dialysis: Secondary | ICD-10-CM | POA: Diagnosis not present

## 2019-06-20 DIAGNOSIS — J439 Emphysema, unspecified: Secondary | ICD-10-CM | POA: Diagnosis not present

## 2019-06-20 DIAGNOSIS — Z7982 Long term (current) use of aspirin: Secondary | ICD-10-CM | POA: Diagnosis not present

## 2019-06-20 DIAGNOSIS — Z48813 Encounter for surgical aftercare following surgery on the respiratory system: Secondary | ICD-10-CM | POA: Diagnosis not present

## 2019-06-20 DIAGNOSIS — Z79899 Other long term (current) drug therapy: Secondary | ICD-10-CM | POA: Diagnosis not present

## 2019-06-20 DIAGNOSIS — I252 Old myocardial infarction: Secondary | ICD-10-CM | POA: Diagnosis not present

## 2019-06-20 DIAGNOSIS — T82898A Other specified complication of vascular prosthetic devices, implants and grafts, initial encounter: Principal | ICD-10-CM | POA: Insufficient documentation

## 2019-06-20 DIAGNOSIS — N186 End stage renal disease: Secondary | ICD-10-CM | POA: Diagnosis present

## 2019-06-20 DIAGNOSIS — I132 Hypertensive heart and chronic kidney disease with heart failure and with stage 5 chronic kidney disease, or end stage renal disease: Secondary | ICD-10-CM | POA: Diagnosis not present

## 2019-06-20 DIAGNOSIS — I4891 Unspecified atrial fibrillation: Secondary | ICD-10-CM | POA: Diagnosis not present

## 2019-06-20 DIAGNOSIS — I700 Atherosclerosis of aorta: Secondary | ICD-10-CM | POA: Insufficient documentation

## 2019-06-20 DIAGNOSIS — Z87891 Personal history of nicotine dependence: Secondary | ICD-10-CM | POA: Diagnosis not present

## 2019-06-20 DIAGNOSIS — I429 Cardiomyopathy, unspecified: Secondary | ICD-10-CM | POA: Diagnosis not present

## 2019-06-20 DIAGNOSIS — Z7902 Long term (current) use of antithrombotics/antiplatelets: Secondary | ICD-10-CM | POA: Insufficient documentation

## 2019-06-20 DIAGNOSIS — I5033 Acute on chronic diastolic (congestive) heart failure: Secondary | ICD-10-CM | POA: Diagnosis not present

## 2019-06-20 DIAGNOSIS — Z419 Encounter for procedure for purposes other than remedying health state, unspecified: Secondary | ICD-10-CM

## 2019-06-20 DIAGNOSIS — Z9889 Other specified postprocedural states: Secondary | ICD-10-CM

## 2019-06-20 HISTORY — DX: Cardiac arrest, cause unspecified: I46.9

## 2019-06-20 HISTORY — DX: Cardiac arrhythmia, unspecified: I49.9

## 2019-06-20 HISTORY — PX: REVISION OF ARTERIOVENOUS GORETEX GRAFT: SHX6073

## 2019-06-20 HISTORY — PX: INSERTION OF DIALYSIS CATHETER: SHX1324

## 2019-06-20 HISTORY — DX: Acute myocardial infarction, unspecified: I21.9

## 2019-06-20 HISTORY — DX: Atherosclerotic heart disease of native coronary artery without angina pectoris: I25.10

## 2019-06-20 LAB — POCT I-STAT 4, (NA,K, GLUC, HGB,HCT)
Glucose, Bld: 82 mg/dL (ref 70–99)
HCT: 34 % — ABNORMAL LOW (ref 39.0–52.0)
Hemoglobin: 11.6 g/dL — ABNORMAL LOW (ref 13.0–17.0)
Potassium: 3.9 mmol/L (ref 3.5–5.1)
Sodium: 137 mmol/L (ref 135–145)

## 2019-06-20 LAB — CREATININE, SERUM
Creatinine, Ser: 11.85 mg/dL — ABNORMAL HIGH (ref 0.61–1.24)
GFR calc Af Amer: 5 mL/min — ABNORMAL LOW (ref >60)
GFR calc non Af Amer: 4 mL/min — ABNORMAL LOW (ref >60)

## 2019-06-20 LAB — CBC
HCT: 32.7 % — ABNORMAL LOW (ref 39.0–52.0)
Hemoglobin: 11.4 g/dL — ABNORMAL LOW (ref 13.0–17.0)
MCH: 33.7 pg (ref 26.0–34.0)
MCHC: 34.9 g/dL (ref 30.0–36.0)
MCV: 96.7 fL (ref 80.0–100.0)
Platelets: 169 10*3/uL (ref 150–400)
RBC: 3.38 MIL/uL — ABNORMAL LOW (ref 4.22–5.81)
RDW: 14.6 % (ref 11.5–15.5)
WBC: 5.5 10*3/uL (ref 4.0–10.5)
nRBC: 0 % (ref 0.0–0.2)

## 2019-06-20 LAB — SARS CORONAVIRUS 2 (TAT 6-24 HRS): SARS Coronavirus 2: NEGATIVE

## 2019-06-20 SURGERY — REVISION OF ARTERIOVENOUS GORETEX GRAFT
Anesthesia: Monitor Anesthesia Care | Site: Arm Upper

## 2019-06-20 MED ORDER — CEFAZOLIN SODIUM-DEXTROSE 2-4 GM/100ML-% IV SOLN
2.0000 g | INTRAVENOUS | Status: AC
  Administered 2019-06-20: 2 g via INTRAVENOUS
  Filled 2019-06-20: qty 100

## 2019-06-20 MED ORDER — ONDANSETRON HCL 4 MG/2ML IJ SOLN
INTRAMUSCULAR | Status: DC | PRN
  Administered 2019-06-20: 4 mg via INTRAVENOUS

## 2019-06-20 MED ORDER — NITROGLYCERIN 0.4 MG SL SUBL: 0.4000 mg | SUBLINGUAL_TABLET | SUBLINGUAL | Status: DC | PRN

## 2019-06-20 MED ORDER — DEXAMETHASONE SODIUM PHOSPHATE 10 MG/ML IJ SOLN
INTRAMUSCULAR | Status: DC | PRN
  Administered 2019-06-20: 5 mg via INTRAVENOUS

## 2019-06-20 MED ORDER — CLOPIDOGREL BISULFATE 75 MG PO TABS
75.0000 mg | ORAL_TABLET | Freq: Every evening | ORAL | Status: DC
  Filled 2019-06-20: qty 1

## 2019-06-20 MED ORDER — PROPOFOL 10 MG/ML IV BOLUS
INTRAVENOUS | Status: AC
  Filled 2019-06-20: qty 20

## 2019-06-20 MED ORDER — LIDOCAINE HCL (PF) 1 % IJ SOLN
INTRAMUSCULAR | Status: AC
  Filled 2019-06-20: qty 30

## 2019-06-20 MED ORDER — ENOXAPARIN SODIUM 30 MG/0.3ML ~~LOC~~ SOLN: 30.0000 mg | SUBCUTANEOUS | Status: DC

## 2019-06-20 MED ORDER — HYDROMORPHONE HCL 1 MG/ML IJ SOLN: 0.5000 mg | INTRAMUSCULAR | Status: DC | PRN

## 2019-06-20 MED ORDER — POTASSIUM CHLORIDE CRYS ER 20 MEQ PO TBCR: 20.0000 meq | EXTENDED_RELEASE_TABLET | Freq: Once | ORAL | Status: DC

## 2019-06-20 MED ORDER — ASPIRIN EC 81 MG PO TBEC: 81.0000 mg | DELAYED_RELEASE_TABLET | Freq: Every evening | ORAL | Status: DC

## 2019-06-20 MED ORDER — METOPROLOL TARTRATE 5 MG/5ML IV SOLN: 2.0000 mg | INTRAVENOUS | Status: DC | PRN

## 2019-06-20 MED ORDER — DOCUSATE SODIUM 100 MG PO CAPS
100.0000 mg | ORAL_CAPSULE | Freq: Two times a day (BID) | ORAL | Status: DC
  Administered 2019-06-21: 09:00:00 100 mg via ORAL
  Filled 2019-06-20 (×2): qty 1

## 2019-06-20 MED ORDER — PHENOL 1.4 % MT LIQD: 1.0000 | OROMUCOSAL | Status: DC | PRN

## 2019-06-20 MED ORDER — SODIUM CHLORIDE 0.9% FLUSH
3.0000 mL | Freq: Two times a day (BID) | INTRAVENOUS | Status: DC
  Administered 2019-06-20: 3 mL via INTRAVENOUS

## 2019-06-20 MED ORDER — HYDRALAZINE HCL 20 MG/ML IJ SOLN: 5.0000 mg | INTRAMUSCULAR | Status: DC | PRN

## 2019-06-20 MED ORDER — PROTAMINE SULFATE 10 MG/ML IV SOLN
INTRAVENOUS | Status: AC
  Filled 2019-06-20: qty 5

## 2019-06-20 MED ORDER — HEPARIN SODIUM (PORCINE) 1000 UNIT/ML IJ SOLN
INTRAMUSCULAR | Status: DC | PRN
  Administered 2019-06-20: 3400 [IU] via INTRAVENOUS

## 2019-06-20 MED ORDER — SENNOSIDES-DOCUSATE SODIUM 8.6-50 MG PO TABS: 1.0000 | ORAL_TABLET | Freq: Every evening | ORAL | Status: DC | PRN

## 2019-06-20 MED ORDER — ALUM & MAG HYDROXIDE-SIMETH 200-200-20 MG/5ML PO SUSP: 15.0000 mL | ORAL | Status: DC | PRN

## 2019-06-20 MED ORDER — SODIUM CHLORIDE 0.9 % IV SOLN
INTRAVENOUS | Status: DC | PRN
  Administered 2019-06-20: 500 mL

## 2019-06-20 MED ORDER — LIDOCAINE HCL (PF) 1 % IJ SOLN
INTRAMUSCULAR | Status: DC | PRN
  Administered 2019-06-20: 30 mL

## 2019-06-20 MED ORDER — BUPIVACAINE-EPINEPHRINE (PF) 0.25% -1:200000 IJ SOLN
INTRAMUSCULAR | Status: AC
  Filled 2019-06-20: qty 30

## 2019-06-20 MED ORDER — PANTOPRAZOLE SODIUM 40 MG PO TBEC
40.0000 mg | DELAYED_RELEASE_TABLET | Freq: Every day | ORAL | Status: DC
  Administered 2019-06-20 – 2019-06-21 (×2): 40 mg via ORAL
  Filled 2019-06-20 (×2): qty 1

## 2019-06-20 MED ORDER — SODIUM CHLORIDE 0.9 % IV SOLN: 250.0000 mL | INTRAVENOUS | Status: DC | PRN

## 2019-06-20 MED ORDER — SODIUM CHLORIDE 0.9 % IV SOLN
INTRAVENOUS | Status: DC | PRN
  Administered 2019-06-20: 40 ug/min via INTRAVENOUS

## 2019-06-20 MED ORDER — SODIUM CHLORIDE 0.9 % IV SOLN
INTRAVENOUS | Status: AC
  Filled 2019-06-20: qty 1.2

## 2019-06-20 MED ORDER — MIDAZOLAM HCL 2 MG/2ML IJ SOLN
INTRAMUSCULAR | Status: DC | PRN
  Administered 2019-06-20: 2 mg via INTRAVENOUS

## 2019-06-20 MED ORDER — FENTANYL CITRATE (PF) 250 MCG/5ML IJ SOLN
INTRAMUSCULAR | Status: DC | PRN
  Administered 2019-06-20: 50 ug via INTRAVENOUS
  Administered 2019-06-20: 25 ug via INTRAVENOUS
  Administered 2019-06-20 (×2): 50 ug via INTRAVENOUS
  Administered 2019-06-20: 25 ug via INTRAVENOUS
  Administered 2019-06-20: 50 ug via INTRAVENOUS

## 2019-06-20 MED ORDER — HEPARIN SODIUM (PORCINE) 1000 UNIT/ML IJ SOLN
INTRAMUSCULAR | Status: AC
  Filled 2019-06-20: qty 1

## 2019-06-20 MED ORDER — HEPARIN SODIUM (PORCINE) 5000 UNIT/ML IJ SOLN: 5000.0000 [IU] | Freq: Three times a day (TID) | INTRAMUSCULAR | Status: DC

## 2019-06-20 MED ORDER — HEPARIN SODIUM (PORCINE) 1000 UNIT/ML IJ SOLN
INTRAMUSCULAR | Status: DC | PRN
  Administered 2019-06-20: 8000 [IU] via INTRAVENOUS

## 2019-06-20 MED ORDER — FENTANYL CITRATE (PF) 250 MCG/5ML IJ SOLN
INTRAMUSCULAR | Status: AC
  Filled 2019-06-20: qty 5

## 2019-06-20 MED ORDER — CHLORHEXIDINE GLUCONATE 4 % EX LIQD: 60.0000 mL | Freq: Once | CUTANEOUS | Status: DC

## 2019-06-20 MED ORDER — PROPOFOL 1000 MG/100ML IV EMUL
INTRAVENOUS | Status: AC
  Filled 2019-06-20: qty 100

## 2019-06-20 MED ORDER — GUAIFENESIN-DM 100-10 MG/5ML PO SYRP: 15.0000 mL | ORAL_SOLUTION | ORAL | Status: DC | PRN

## 2019-06-20 MED ORDER — LIDOCAINE 2% (20 MG/ML) 5 ML SYRINGE
INTRAMUSCULAR | Status: DC | PRN
  Administered 2019-06-20: 40 mg via INTRAVENOUS

## 2019-06-20 MED ORDER — GLYCOPYRROLATE PF 0.2 MG/ML IJ SOSY
PREFILLED_SYRINGE | INTRAMUSCULAR | Status: DC | PRN
  Administered 2019-06-20: .2 mg via INTRAVENOUS

## 2019-06-20 MED ORDER — PHENYLEPHRINE 40 MCG/ML (10ML) SYRINGE FOR IV PUSH (FOR BLOOD PRESSURE SUPPORT)
PREFILLED_SYRINGE | INTRAVENOUS | Status: DC | PRN
  Administered 2019-06-20: 80 ug via INTRAVENOUS
  Administered 2019-06-20: 160 ug via INTRAVENOUS
  Administered 2019-06-20: 80 ug via INTRAVENOUS

## 2019-06-20 MED ORDER — PROPOFOL 500 MG/50ML IV EMUL
INTRAVENOUS | Status: DC | PRN
  Administered 2019-06-20: 60 ug/kg/min via INTRAVENOUS

## 2019-06-20 MED ORDER — ATORVASTATIN CALCIUM 80 MG PO TABS
80.0000 mg | ORAL_TABLET | Freq: Every evening | ORAL | Status: DC
  Administered 2019-06-20: 22:00:00 80 mg via ORAL
  Filled 2019-06-20: qty 1

## 2019-06-20 MED ORDER — MIDAZOLAM HCL 2 MG/2ML IJ SOLN
INTRAMUSCULAR | Status: AC
  Filled 2019-06-20: qty 2

## 2019-06-20 MED ORDER — FENTANYL CITRATE (PF) 100 MCG/2ML IJ SOLN
25.0000 ug | INTRAMUSCULAR | Status: DC | PRN
  Administered 2019-06-20 (×2): 50 ug via INTRAVENOUS

## 2019-06-20 MED ORDER — PROPOFOL 10 MG/ML IV BOLUS
INTRAVENOUS | Status: DC | PRN
  Administered 2019-06-20: 80 mg via INTRAVENOUS
  Administered 2019-06-20: 15 mg via INTRAVENOUS

## 2019-06-20 MED ORDER — FENTANYL CITRATE (PF) 100 MCG/2ML IJ SOLN: 25.0000 ug | INTRAMUSCULAR | Status: DC | PRN

## 2019-06-20 MED ORDER — PROTAMINE SULFATE 10 MG/ML IV SOLN
INTRAVENOUS | Status: DC | PRN
  Administered 2019-06-20: 10 mg via INTRAVENOUS
  Administered 2019-06-20: 70 mg via INTRAVENOUS

## 2019-06-20 MED ORDER — SODIUM CHLORIDE 0.9% FLUSH: 3.0000 mL | INTRAVENOUS | Status: DC | PRN

## 2019-06-20 MED ORDER — BISACODYL 5 MG PO TBEC: 5.0000 mg | DELAYED_RELEASE_TABLET | Freq: Every day | ORAL | Status: DC | PRN

## 2019-06-20 MED ORDER — SODIUM CHLORIDE 0.9 % IV SOLN
INTRAVENOUS | Status: DC
  Administered 2019-06-20 (×2): via INTRAVENOUS

## 2019-06-20 MED ORDER — PROTAMINE SULFATE 10 MG/ML IV SOLN
INTRAVENOUS | Status: AC
  Filled 2019-06-20: qty 10

## 2019-06-20 MED ORDER — FENTANYL CITRATE (PF) 100 MCG/2ML IJ SOLN
INTRAMUSCULAR | Status: AC
  Filled 2019-06-20: qty 2

## 2019-06-20 MED ORDER — ISOSORBIDE MONONITRATE ER 60 MG PO TB24
60.0000 mg | ORAL_TABLET | Freq: Every evening | ORAL | Status: DC
  Administered 2019-06-20: 22:00:00 60 mg via ORAL
  Filled 2019-06-20: qty 1

## 2019-06-20 MED ORDER — LABETALOL HCL 5 MG/ML IV SOLN: 10.0000 mg | INTRAVENOUS | Status: DC | PRN

## 2019-06-20 MED ORDER — OXYCODONE HCL 5 MG PO TABS
5.0000 mg | ORAL_TABLET | ORAL | Status: DC | PRN
  Administered 2019-06-20: 10 mg via ORAL
  Filled 2019-06-20: qty 2

## 2019-06-20 MED ORDER — ONDANSETRON HCL 4 MG/2ML IJ SOLN: 4.0000 mg | Freq: Four times a day (QID) | INTRAMUSCULAR | Status: DC | PRN

## 2019-06-20 MED ORDER — 0.9 % SODIUM CHLORIDE (POUR BTL) OPTIME
TOPICAL | Status: DC | PRN
  Administered 2019-06-20: 1000 mL

## 2019-06-20 SURGICAL SUPPLY — 66 items
BAG DECANTER FOR FLEXI CONT (MISCELLANEOUS) ×2 IMPLANT
BIOPATCH RED 1 DISK 7.0 (GAUZE/BANDAGES/DRESSINGS) ×3 IMPLANT
BLADE CLIPPER SENSICLIP SURGIC (BLADE) ×1 IMPLANT
BNDG ELASTIC 4X5.8 VLCR STR LF (GAUZE/BANDAGES/DRESSINGS) ×1 IMPLANT
BNDG GAUZE ELAST 4 BULKY (GAUZE/BANDAGES/DRESSINGS) ×1 IMPLANT
BRUSH SCRUB EZ PLAIN DRY (MISCELLANEOUS) ×1 IMPLANT
CANISTER SUCT 3000ML PPV (MISCELLANEOUS) ×3 IMPLANT
CANNULA VESSEL 3MM 2 BLNT TIP (CANNULA) ×3 IMPLANT
CATH PALINDROME RT-P 15FX19CM (CATHETERS) IMPLANT
CATH PALINDROME RT-P 15FX23CM (CATHETERS) ×1 IMPLANT
CATH PALINDROME RT-P 15FX28CM (CATHETERS) IMPLANT
CATH PALINDROME RT-P 15FX55CM (CATHETERS) IMPLANT
CATH STRAIGHT 5FR 65CM (CATHETERS) IMPLANT
CHLORAPREP W/TINT 26 (MISCELLANEOUS) ×3 IMPLANT
CLIP VESOCCLUDE MED 6/CT (CLIP) ×3 IMPLANT
CLIP VESOCCLUDE SM WIDE 6/CT (CLIP) ×3 IMPLANT
COVER PROBE W GEL 5X96 (DRAPES) ×1 IMPLANT
COVER SURGICAL LIGHT HANDLE (MISCELLANEOUS) ×2 IMPLANT
COVER WAND RF STERILE (DRAPES) ×2 IMPLANT
DECANTER SPIKE VIAL GLASS SM (MISCELLANEOUS) ×2 IMPLANT
DERMABOND ADVANCED (GAUZE/BANDAGES/DRESSINGS) ×2
DERMABOND ADVANCED .7 DNX12 (GAUZE/BANDAGES/DRESSINGS) ×2 IMPLANT
DRAPE C-ARM 42X72 X-RAY (DRAPES) ×3 IMPLANT
DRAPE CHEST BREAST 15X10 FENES (DRAPES) ×2 IMPLANT
DRAPE ORTHO SPLIT 77X108 STRL (DRAPES) ×1
DRAPE SURG ORHT 6 SPLT 77X108 (DRAPES) IMPLANT
DRESSING TELFA 8X10 (GAUZE/BANDAGES/DRESSINGS) ×1 IMPLANT
DRSG COVADERM 4X6 (GAUZE/BANDAGES/DRESSINGS) ×1 IMPLANT
ELECT REM PT RETURN 9FT ADLT (ELECTROSURGICAL) ×3
ELECTRODE REM PT RTRN 9FT ADLT (ELECTROSURGICAL) ×2 IMPLANT
GAUZE 4X4 16PLY RFD (DISPOSABLE) ×4 IMPLANT
GLOVE BIO SURGEON STRL SZ7.5 (GLOVE) ×3 IMPLANT
GLOVE BIOGEL PI IND STRL 6.5 (GLOVE) IMPLANT
GLOVE BIOGEL PI INDICATOR 6.5 (GLOVE) ×3
GLOVE SURG SS PI 6.5 STRL IVOR (GLOVE) ×1 IMPLANT
GOWN STRL REUS W/ TWL LRG LVL3 (GOWN DISPOSABLE) ×6 IMPLANT
GOWN STRL REUS W/TWL LRG LVL3 (GOWN DISPOSABLE) ×3
KIT BASIN OR (CUSTOM PROCEDURE TRAY) ×3 IMPLANT
KIT TURNOVER KIT B (KITS) ×3 IMPLANT
LOOP VESSEL MINI RED (MISCELLANEOUS) IMPLANT
NDL 18GX1X1/2 (RX/OR ONLY) (NEEDLE) ×2 IMPLANT
NDL HYPO 25GX1X1/2 BEV (NEEDLE) ×2 IMPLANT
NEEDLE 18GX1X1/2 (RX/OR ONLY) (NEEDLE) ×3 IMPLANT
NEEDLE HYPO 25GX1X1/2 BEV (NEEDLE) IMPLANT
NS IRRIG 1000ML POUR BTL (IV SOLUTION) ×3 IMPLANT
PACK CV ACCESS (CUSTOM PROCEDURE TRAY) ×3 IMPLANT
PACK SURGICAL SETUP 50X90 (CUSTOM PROCEDURE TRAY) ×2 IMPLANT
PAD ARMBOARD 7.5X6 YLW CONV (MISCELLANEOUS) ×6 IMPLANT
SET MICROPUNCTURE 5F STIFF (MISCELLANEOUS) IMPLANT
SPONGE SURGIFOAM ABS GEL 100 (HEMOSTASIS) IMPLANT
SUT ETHILON 3 0 PS 1 (SUTURE) ×3 IMPLANT
SUT PROLENE 5 0 C 1 24 (SUTURE) ×4 IMPLANT
SUT PROLENE 6 0 CC (SUTURE) ×3 IMPLANT
SUT VIC AB 3-0 SH 27 (SUTURE) ×2
SUT VIC AB 3-0 SH 27X BRD (SUTURE) ×2 IMPLANT
SUT VIC AB 4-0 PS2 18 (SUTURE) ×2 IMPLANT
SUT VICRYL 4-0 PS2 18IN ABS (SUTURE) ×3 IMPLANT
SYR 10ML LL (SYRINGE) ×4 IMPLANT
SYR 20ML LL LF (SYRINGE) ×4 IMPLANT
SYR 5ML LL (SYRINGE) ×3 IMPLANT
SYR CONTROL 10ML LL (SYRINGE) ×2 IMPLANT
TOWEL GREEN STERILE (TOWEL DISPOSABLE) ×6 IMPLANT
TOWEL GREEN STERILE FF (TOWEL DISPOSABLE) ×3 IMPLANT
UNDERPAD 30X30 (UNDERPADS AND DIAPERS) ×3 IMPLANT
WATER STERILE IRR 1000ML POUR (IV SOLUTION) ×3 IMPLANT
WIRE AMPLATZ SS-J .035X180CM (WIRE) IMPLANT

## 2019-06-20 NOTE — Op Note (Signed)
Procedure: Ultrasound-guided insertion of Palindrome catheter, 23 cm, plication left arm AV fistula aneurysm x 2  Preoperative diagnosis: End-stage renal disease  Postoperative diagnosis: Same  Assistant: Gerri Lins, PA-C  Anesthesia: General  Operative findings: 23 cm Palindrome catheter right internal jugular vein   Operative details: After obtaining informed consent, the patient was taken to the operating room. The patient was placed in supine position on the operating room table. After commencing general anesthesia the patient's entire neck and chest were prepped and draped in usual sterile fashion. The patient was placed in Trendelenburg position. Ultrasound was used to identify the patient's right internal jugular vein. This had normal compressibility and respiratory variation. Local anesthesia was infiltrated over the right jugular vein.  Using ultrasound guidance, the right internal jugular vein was successfully cannulated.  A 0.035 J-tipped guidewire was threaded into the right internal jugular vein and into the superior vena cava followed by the inferior vena cava under fluoroscopic guidance.   Next sequential 12 and 14 dilators were placed over the guidewire into the right atrium.  A 16 French dilator with a peel-away sheath was then placed over the guidewire into the right atrium.   The guidewire and dilator were removed. A 23 cm Palindrome catheter was then placed through the peel away sheath into the right atrium.  The catheter was then tunneled subcutaneously, cut to length, and the hub attached. The catheter was noted to flush and draw easily. The catheter was inspected under fluoroscopy and found with its tip to be in the right atrium without any kinks throughout its course. The catheter was sutured to the skin with nylon sutures. The neck insertion site was closed with Vicryl stitch. The catheter was then loaded with concentrated Heparin solution. A dry sterile dressing was  applied.  Next, the patient's entire left upper extremities prepped and draped in usual sterile fashion.  2 elliptical incisions were made in the upper arm encompassing a proximal and mid aneurysm.  The incisions were both carried down through the subcutaneous tissues down the level of the fistula.  The fistula and aneurysm were dissected free circumferentially.  There was a normal segment of vein proximal to the first fistula and then a normal intervening segment between the 2 aneurysms.  These were both dissected free circumferentially.  The vein just above the more distal aneurysm was also dissected free circumferentially.  Patient was given 8000 units of intravenous heparin.  After 2 minutes of circulation time, the fistula was clamped proximally and distally.  Longitudinal opening was made in the proximal aneurysm and some of the aneurysmal tissue resected.  A 5-0 Prolene was then used for a running closure.  A completion anastomosis there were 2 areas still oozing from the suture line and these were repaired with individual figure-of-eight 5-0 Prolene sutures.  Attention was then turned to the aneurysm in the midportion of the fistula.  In similar fashion this was unroofed and portions of it resected.  This was then repaired again using a running 5-0 Prolene suture.  There were also 2 areas of small leak on the suture line which were repaired with individual 5-0 Prolene's.  Everything was for blood backbled and thoroughly flushed.  Anastomosis was secured clamps released there was a palpable thrill in the fistula immediately.  Patient was given 80 mg of protamine.  Hemostasis was obtained with direct pressure.  The subcutaneous tissues of both incisions were closed with a running 3-0 Vicryl suture.  The skin of both incisions  was closed with a 4-0 Vicryl subcuticular stitch.  Dermabond was applied as well as a dry sterile dressing and Ace wrap.  The patient tolerated procedure well and there were no  complications.  The instrument sponge and needle count was correct at the end of the case.  The patient was taken the recovery room in stable condition.  The patient tolerated the procedure well and there were no complications. Instrument sponge and needle counts correct in the case. The patient was taken to the recovery room in stable condition. Chest x-ray will be obtained in the recovery room.  Ruta Hinds, MD [facility] Office: [phone] Pager: [phone]

## 2019-06-20 NOTE — Progress Notes (Addendum)
About 15 minutes after patient arrival I went in (negative pressure) room to assess for COVID symptoms with mask and face shield. Patient had mask on as well. Noted that patient coughed several times while in room, I asked how long he had the cough initially he stated just for a "few days." Patient placed on airborne and contact isolation. During preop assessment patient reported the cough was chronic. Patient to remain on airborne and contact isolation. Patient intermittently coughing during assessment.  Spoke with daughter and she reports that he has the cough off and on.

## 2019-06-20 NOTE — Discharge Instructions (Signed)
° °  [facility] ° °Discharge Instructions ° °AV Fistula or Graft Surgery for Dialysis Access ° °Please refer to the following instructions for your post-procedure care. Your surgeon or physician assistant will discuss any changes with you. ° °Activity ° °You may drive the day following your surgery, if you are comfortable and no longer taking prescription pain medication. Resume full activity as the soreness in your incision resolves. ° °Bathing/Showering ° °You may shower after you go home. Keep your incision dry for 48 hours. Do not soak in a bathtub, hot tub, or swim until the incision heals completely. You may not shower if you have a hemodialysis catheter. ° °Incision Care ° °Clean your incision with mild soap and water after 48 hours. Pat the area dry with a clean towel. You do not need a bandage unless otherwise instructed. Do not apply any ointments or creams to your incision. You may have skin glue on your incision. Do not peel it off. It will come off on its own in about one week. Your arm may swell a bit after surgery. To reduce swelling use pillows to elevate your arm so it is above your heart. Your doctor will tell you if you need to lightly wrap your arm with an ACE bandage. ° °Diet ° °Resume your normal diet. There are not special food restrictions following this procedure. In order to heal from your surgery, it is CRITICAL to get adequate nutrition. Your body requires vitamins, minerals, and protein. Vegetables are the best source of vitamins and minerals. Vegetables also provide the perfect balance of protein. Processed food has little nutritional value, so try to avoid this. ° °Medications ° °Resume taking all of your medications. If your incision is causing pain, you may take over-the counter pain relievers such as acetaminophen (Tylenol). If you were prescribed a stronger pain medication, please be aware these medications can cause nausea and constipation. Prevent  nausea by taking the medication with a snack or meal. Avoid constipation by drinking plenty of fluids and eating foods with high amount of fiber, such as fruits, vegetables, and grains. Do not take Tylenol if you are taking prescription pain medications. ° ° ° ° °Follow up °Your surgeon may want to see you in the office following your access surgery. If so, this will be arranged at the time of your surgery. ° °Please call us immediately for any of the following conditions: ° °Increased pain, redness, drainage (pus) from your incision site °Fever of 101 degrees or higher °Severe or worsening pain at your incision site °Hand pain or numbness. ° °Reduce your risk of vascular disease: ° °Stop smoking. If you would like help, call [facility] at 1-800-QUIT-NOW (1-800-784-8669) or [facility] at 336-586-4000 ° °Manage your cholesterol °Maintain a desired weight °Control your diabetes °Keep your blood pressure down ° °Dialysis ° °It will take several weeks to several months for your new dialysis access to be ready for use. Your surgeon will determine when it is OK to use it. Your nephrologist will continue to direct your dialysis. You can continue to use your Permcath until your new access is ready for use. ° °If you have any questions, please call the office at [phone]. ° °

## 2019-06-20 NOTE — Progress Notes (Signed)
I have gone by and checked on patient multiple times. Reports no needs.

## 2019-06-20 NOTE — Progress Notes (Signed)
Dr. Oneida Alar spoke to pt. At bedside post op,   Pt. Will stay overnight , plan for discharge in am .  Pt. Will need to be at dialysis at 11am at [facility] .

## 2019-06-20 NOTE — Interval H&P Note (Signed)
History and Physical Interval Note:  06/20/2019 1:46 PM  Kenneth [name]  has presented today for surgery, with the diagnosis of COMPLICATION OF ARTERIOVENOUS FISTULA LEFT ARM.  The various methods of treatment have been discussed with the patient and family. After consideration of risks, benefits and other options for treatment, the patient has consented to  Procedure(s): PLICATION OF ARTERIOVENOUS FISTULA LEFT ARM (Left) INSERTION OF TUNNELED  DIALYSIS CATHETER (N/A) as a surgical intervention.  The patient's history has been reviewed, patient examined, no change in status, stable for surgery.  I have reviewed the patient's chart and labs.  Questions were answered to the patient's satisfaction.     Ruta Hinds

## 2019-06-20 NOTE — Transfer of Care (Signed)
Immediate Anesthesia Transfer of Care Note  Patient: Kenneth [name]  Procedure(s) Performed: PLICATION OF ARTERIOVENOUS FISTULA LEFT ARM (Left Arm Upper) INSERTION OF TUNNELED  DIALYSIS CATHETER (N/A )  Patient Location: PACU  Anesthesia Type:General  Level of Consciousness: drowsy and patient cooperative  Airway & Oxygen Therapy: Patient Spontanous Breathing  Post-op Assessment: Report given to RN and Post -op Vital signs reviewed and stable  Post vital signs: Reviewed and stable  Last Vitals:  Vitals Value Taken Time  BP    Temp    Pulse    Resp    SpO2      Last Pain:  Vitals:   06/20/19 0918  TempSrc: Oral  PainSc: 0-No pain      Patients Stated Pain Goal: 3 ([id number])  Complications: No apparent anesthesia complications

## 2019-06-20 NOTE — Progress Notes (Signed)
Late entry: Patient reports riding [facility] to hospital. Dr. Oneida Alar notified that patient rode public transportation to hospital and does not have anyone that can ride with him or pick him up. Patient reported that he does have someone at home to stay with him.

## 2019-06-20 NOTE — Anesthesia Procedure Notes (Signed)
Procedure Name: LMA Insertion Date/Time: 06/20/2019 4:03 PM Performed by: Elayne Snare, CRNA Pre-anesthesia Checklist: Patient identified, Emergency Drugs available, Suction available and Patient being monitored Patient Re-evaluated:Patient Re-evaluated prior to induction Oxygen Delivery Method: Circle System Utilized Preoxygenation: Pre-oxygenation with 100% oxygen Induction Type: IV induction Ventilation: Mask ventilation without difficulty LMA: LMA inserted LMA Size: 4.0 Number of attempts: 1 Airway Equipment and Method: Bite block Placement Confirmation: positive ETCO2 Tube secured with: Tape Dental Injury: Teeth and Oropharynx as per pre-operative assessment

## 2019-06-21 ENCOUNTER — Encounter (HOSPITAL_COMMUNITY): Payer: Self-pay | Admitting: Vascular Surgery

## 2019-06-21 DIAGNOSIS — Z20828 Contact with and (suspected) exposure to other viral communicable diseases: Secondary | ICD-10-CM | POA: Diagnosis not present

## 2019-06-21 DIAGNOSIS — N186 End stage renal disease: Secondary | ICD-10-CM | POA: Diagnosis not present

## 2019-06-21 DIAGNOSIS — T82898A Other specified complication of vascular prosthetic devices, implants and grafts, initial encounter: Secondary | ICD-10-CM | POA: Diagnosis not present

## 2019-06-21 DIAGNOSIS — I5042 Chronic combined systolic (congestive) and diastolic (congestive) heart failure: Secondary | ICD-10-CM | POA: Diagnosis not present

## 2019-06-21 DIAGNOSIS — I132 Hypertensive heart and chronic kidney disease with heart failure and with stage 5 chronic kidney disease, or end stage renal disease: Secondary | ICD-10-CM | POA: Diagnosis not present

## 2019-06-21 DIAGNOSIS — N2581 Secondary hyperparathyroidism of renal origin: Secondary | ICD-10-CM | POA: Diagnosis not present

## 2019-06-21 DIAGNOSIS — Z992 Dependence on renal dialysis: Secondary | ICD-10-CM | POA: Diagnosis not present

## 2019-06-21 DIAGNOSIS — D509 Iron deficiency anemia, unspecified: Secondary | ICD-10-CM | POA: Diagnosis not present

## 2019-06-21 LAB — HIV ANTIBODY (ROUTINE TESTING W REFLEX): HIV Screen 4th Generation wRfx: NONREACTIVE

## 2019-06-21 MED ORDER — OXYCODONE HCL 5 MG PO TABS: 5.0000 mg | ORAL_TABLET | Freq: Four times a day (QID) | ORAL | 0 refills | Status: DC | PRN

## 2019-06-21 NOTE — Progress Notes (Signed)
Patient states needs taxi voucher to [facility] provided patient with taxi voucher.  Thurmond Butts, MSW, [place] Social Worker [phone]

## 2019-06-21 NOTE — Progress Notes (Addendum)
[facility]  Subjective  - doing fine, no new complaints.   Objective (!) 176/91 67 97.6 F (36.4 C) (Oral) 11 94%  Intake/Output Summary (Last 24 hours) at 06/21/2019 0706 Last data filed at 06/21/2019 0013 Gross per 24 hour  Intake 770 ml  Output 30 ml  Net 740 ml    Left UE incisions healing well Ace removed Left UE sensation and motor intact with excellent palpable thrill in fistula  Assessment/Planning: POD # 1 plication x 2 left UE AV fistula aneurysms and placement of right TDC.  F/U in 4 weeks in [facility] clinic for incision checks. Discharge this am in stable condition  Roxy Horseman 06/21/2019 7:06 AM --  Agree with above D/c home, fistula patent, incision clean no hematoma HD at [place] am  Ruta Hinds, MD [facility]: [phone] Pager: [phone]  Laboratory Lab Results: Recent Labs    06/20/19 0942 06/20/19 1842  WBC  --  5.5  HGB 11.6* 11.4*  HCT 34.0* 32.7*  PLT  --  169   BMET Recent Labs    06/20/19 0942 06/20/19 1842  NA 137  --   K 3.9  --   GLUCOSE 82  --   CREATININE  --  11.85*    COAG Lab Results  Component Value Date   INR 1.05 09/17/2018   INR 1.15 03/09/2018   INR 1.06 03/09/2018   No results found for: PTT

## 2019-06-21 NOTE — Plan of Care (Signed)
?  Problem: Clinical Measurements: ?Goal: Will remain free from infection ?Outcome: Progressing ?  ?

## 2019-06-22 NOTE — Discharge Summary (Signed)
[facility] Discharge Summary   Patient ID:  Kenneth [name] MRN: [id number] DOB/AGE: 1956/04/09 63 y.o.  Admit date: 06/20/2019 Discharge date: 06/21/2019 Date of Surgery: 06/20/2019 Surgeon: Surgeon(s): Fields, Jessy Oto, MD  Admission Diagnosis: COMPLICATION OF ARTERIOVENOUS FISTULA LEFT ARM  Discharge Diagnoses:  COMPLICATION OF ARTERIOVENOUS FISTULA LEFT ARM  Secondary Diagnoses: Past Medical History:  Diagnosis Date  . Acute CHF ([place]) 01/2018  . Cardiomyopathy secondary    likely related to HTN heart disease; possibly ETOH related as well  . Chronic combined systolic and diastolic heart failure (HCC)    Echocardiogram 09/22/11: Moderate LVH, EF 99991111, grade 3 diastolic dysfunction, mild MR, moderate to severe LAE, mild RVE, mild to moderate TR, small to moderate pericardial effusion  . Coronary artery disease    not felt to be candidate for CABG 08/2018; medical thearpy recomended due to high risk of PCI   . Dysrhythmia    aflutter 04/2016, afib 02/2018, not felt to be a candidate for anticoagulation due to non-compliance and ETOH  . ESRD (end stage renal disease) on dialysis ([place])    due to hypertensive nephrosclerosis; TTS; [address] (09/01/2018)  . History of alcohol abuse   . Hypertension   . Myocardial infarction [facility])    " mild " per daughter  . PEA (Pulseless electrical activity) ([place])    PEA arrest 03/09/18, treated empirically for hyperkalemia, shock x1 for WCT, given amiodarone, ROSC after 10 min of ACLS    Procedure(s): PLICATION OF ARTERIOVENOUS FISTULA LEFT ARM INSERTION OF TUNNELED  DIALYSIS CATHETER  Discharged Condition: good  HPI: 63 y/o male was sen in our off for Aneurysmal degeneration left arm AV fistula with episodes of prolonged bleeding.  He was scheduled for plication of these ares.    Hospital Course:  KALVEN ARGENT is a 63 y.o. male is S/P  Procedure(s): PLICATION OF ARTERIOVENOUS FISTULA LEFT ARM INSERTION OF TUNNELED   DIALYSIS CATHETER   Left UE incisions healing well Ace removed Left UE sensation and motor intact with excellent palpable thrill in fistula.  He was discharged home in stable condition with a f/u visit for incisional checks.  He has a [facility] for HD.  The fistula may be accessed in 4 weeks.     Significant Diagnostic Studies: CBC Lab Results  Component Value Date   WBC 5.5 06/20/2019   HGB 11.4 (L) 06/20/2019   HCT 32.7 (L) 06/20/2019   MCV 96.7 06/20/2019   PLT 169 06/20/2019    BMET    Component Value Date/Time   NA 137 06/20/2019 0942   K 3.9 06/20/2019 0942   CL 97 (L) 09/19/2018 1811   CO2 26 09/19/2018 1811   GLUCOSE 82 06/20/2019 0942   BUN 42 (H) 09/19/2018 1811   CREATININE 11.85 (H) 06/20/2019 1842   CALCIUM 8.1 (L) 09/19/2018 1811   GFRNONAA 4 (L) 06/20/2019 1842   GFRAA 5 (L) 06/20/2019 1842   COAG Lab Results  Component Value Date   INR 1.05 09/17/2018   INR 1.15 03/09/2018   INR 1.06 03/09/2018     Disposition:  Discharge to :Home Discharge Instructions    Call MD for:  redness, tenderness, or signs of infection (pain, swelling, bleeding, redness, odor or green/yellow discharge around incision site)   Complete by: As directed    Call MD for:  severe or increased pain, loss or decreased feeling  in affected limb(s)   Complete by: As directed    Call MD for:  temperature >100.5   Complete by: As directed    Resume previous diet   Complete by: As directed      Allergies as of 06/21/2019   No Known Allergies     Medication List    TAKE these medications   aspirin EC 81 MG tablet Take 81 mg by mouth every evening.   atorvastatin 80 MG tablet Commonly known as: LIPITOR Take 80 mg by mouth every evening.   clopidogrel 75 MG tablet Commonly known as: PLAVIX Take 75 mg by mouth every evening.   isosorbide mononitrate 60 MG 24 hr tablet Commonly known as: IMDUR Take 60 mg by mouth every evening.   nitroGLYCERIN 0.4 MG SL tablet Commonly  known as: NITROSTAT Place 0.4 mg under the tongue every 5 (five) minutes x 3 doses as needed for chest pain.   oxyCODONE 5 MG immediate release tablet Commonly known as: Oxy IR/ROXICODONE Take 1 tablet (5 mg total) by mouth every 6 (six) hours as needed for moderate pain.      Verbal and written Discharge instructions given to the patient. Wound care per Discharge AVS Follow-up Information    Elam Dutch, MD In 4 weeks.   Specialties: Vascular Surgery, Cardiology Why: Office will call you to arrange your appt (sent) Contact information: [address] [place] [address] [phone]           Signed: Roxy Horseman 06/22/2019, 9:11 AM

## 2019-06-22 NOTE — Anesthesia Postprocedure Evaluation (Signed)
Anesthesia Post Note  Patient: Kenneth [name]  Procedure(s) Performed: PLICATION OF ARTERIOVENOUS FISTULA LEFT ARM (Left Arm Upper) INSERTION OF TUNNELED  DIALYSIS CATHETER (N/A )     Patient location during evaluation: PACU Anesthesia Type: MAC Level of consciousness: awake Pain management: pain level controlled Vital Signs Assessment: post-procedure vital signs reviewed and stable Respiratory status: spontaneous breathing Cardiovascular status: stable Postop Assessment: no apparent nausea or vomiting Anesthetic complications: no    Last Vitals:  Vitals:   06/21/19 1000 06/21/19 1100  BP: (!) 142/79   Pulse: 65 69  Resp: 17 17  Temp:    SpO2: 96% 99%    Last Pain:  Vitals:   06/21/19 1100  TempSrc:   PainSc: 0-No pain                 Fraidy Mccarrick

## 2019-06-23 DIAGNOSIS — D509 Iron deficiency anemia, unspecified: Secondary | ICD-10-CM | POA: Diagnosis not present

## 2019-06-23 DIAGNOSIS — Z992 Dependence on renal dialysis: Secondary | ICD-10-CM | POA: Diagnosis not present

## 2019-06-23 DIAGNOSIS — N186 End stage renal disease: Secondary | ICD-10-CM | POA: Diagnosis not present

## 2019-06-23 DIAGNOSIS — N2581 Secondary hyperparathyroidism of renal origin: Secondary | ICD-10-CM | POA: Diagnosis not present

## 2019-06-25 DIAGNOSIS — D509 Iron deficiency anemia, unspecified: Secondary | ICD-10-CM | POA: Diagnosis not present

## 2019-06-25 DIAGNOSIS — Z992 Dependence on renal dialysis: Secondary | ICD-10-CM | POA: Diagnosis not present

## 2019-06-25 DIAGNOSIS — N2581 Secondary hyperparathyroidism of renal origin: Secondary | ICD-10-CM | POA: Diagnosis not present

## 2019-06-25 DIAGNOSIS — N186 End stage renal disease: Secondary | ICD-10-CM | POA: Diagnosis not present

## 2019-06-28 DIAGNOSIS — Z992 Dependence on renal dialysis: Secondary | ICD-10-CM | POA: Diagnosis not present

## 2019-06-28 DIAGNOSIS — N186 End stage renal disease: Secondary | ICD-10-CM | POA: Diagnosis not present

## 2019-06-28 DIAGNOSIS — D631 Anemia in chronic kidney disease: Secondary | ICD-10-CM | POA: Diagnosis not present

## 2019-06-28 DIAGNOSIS — N2581 Secondary hyperparathyroidism of renal origin: Secondary | ICD-10-CM | POA: Diagnosis not present

## 2019-06-28 DIAGNOSIS — I129 Hypertensive chronic kidney disease with stage 1 through stage 4 chronic kidney disease, or unspecified chronic kidney disease: Secondary | ICD-10-CM | POA: Diagnosis not present

## 2019-06-28 DIAGNOSIS — D509 Iron deficiency anemia, unspecified: Secondary | ICD-10-CM | POA: Diagnosis not present

## 2019-06-30 DIAGNOSIS — D509 Iron deficiency anemia, unspecified: Secondary | ICD-10-CM | POA: Diagnosis not present

## 2019-06-30 DIAGNOSIS — N2581 Secondary hyperparathyroidism of renal origin: Secondary | ICD-10-CM | POA: Diagnosis not present

## 2019-06-30 DIAGNOSIS — Z992 Dependence on renal dialysis: Secondary | ICD-10-CM | POA: Diagnosis not present

## 2019-06-30 DIAGNOSIS — N186 End stage renal disease: Secondary | ICD-10-CM | POA: Diagnosis not present

## 2019-06-30 DIAGNOSIS — D631 Anemia in chronic kidney disease: Secondary | ICD-10-CM | POA: Diagnosis not present

## 2019-07-02 DIAGNOSIS — N186 End stage renal disease: Secondary | ICD-10-CM | POA: Diagnosis not present

## 2019-07-02 DIAGNOSIS — N2581 Secondary hyperparathyroidism of renal origin: Secondary | ICD-10-CM | POA: Diagnosis not present

## 2019-07-02 DIAGNOSIS — D509 Iron deficiency anemia, unspecified: Secondary | ICD-10-CM | POA: Diagnosis not present

## 2019-07-02 DIAGNOSIS — Z992 Dependence on renal dialysis: Secondary | ICD-10-CM | POA: Diagnosis not present

## 2019-07-02 DIAGNOSIS — D631 Anemia in chronic kidney disease: Secondary | ICD-10-CM | POA: Diagnosis not present

## 2019-07-05 DIAGNOSIS — D631 Anemia in chronic kidney disease: Secondary | ICD-10-CM | POA: Diagnosis not present

## 2019-07-05 DIAGNOSIS — D509 Iron deficiency anemia, unspecified: Secondary | ICD-10-CM | POA: Diagnosis not present

## 2019-07-05 DIAGNOSIS — Z992 Dependence on renal dialysis: Secondary | ICD-10-CM | POA: Diagnosis not present

## 2019-07-05 DIAGNOSIS — N2581 Secondary hyperparathyroidism of renal origin: Secondary | ICD-10-CM | POA: Diagnosis not present

## 2019-07-05 DIAGNOSIS — N186 End stage renal disease: Secondary | ICD-10-CM | POA: Diagnosis not present

## 2019-07-07 DIAGNOSIS — D631 Anemia in chronic kidney disease: Secondary | ICD-10-CM | POA: Diagnosis not present

## 2019-07-07 DIAGNOSIS — N186 End stage renal disease: Secondary | ICD-10-CM | POA: Diagnosis not present

## 2019-07-07 DIAGNOSIS — Z992 Dependence on renal dialysis: Secondary | ICD-10-CM | POA: Diagnosis not present

## 2019-07-07 DIAGNOSIS — D509 Iron deficiency anemia, unspecified: Secondary | ICD-10-CM | POA: Diagnosis not present

## 2019-07-07 DIAGNOSIS — N2581 Secondary hyperparathyroidism of renal origin: Secondary | ICD-10-CM | POA: Diagnosis not present

## 2019-07-09 DIAGNOSIS — D631 Anemia in chronic kidney disease: Secondary | ICD-10-CM | POA: Diagnosis not present

## 2019-07-09 DIAGNOSIS — D509 Iron deficiency anemia, unspecified: Secondary | ICD-10-CM | POA: Diagnosis not present

## 2019-07-09 DIAGNOSIS — N2581 Secondary hyperparathyroidism of renal origin: Secondary | ICD-10-CM | POA: Diagnosis not present

## 2019-07-09 DIAGNOSIS — Z992 Dependence on renal dialysis: Secondary | ICD-10-CM | POA: Diagnosis not present

## 2019-07-09 DIAGNOSIS — N186 End stage renal disease: Secondary | ICD-10-CM | POA: Diagnosis not present

## 2019-07-12 DIAGNOSIS — D631 Anemia in chronic kidney disease: Secondary | ICD-10-CM | POA: Diagnosis not present

## 2019-07-12 DIAGNOSIS — N2581 Secondary hyperparathyroidism of renal origin: Secondary | ICD-10-CM | POA: Diagnosis not present

## 2019-07-12 DIAGNOSIS — D509 Iron deficiency anemia, unspecified: Secondary | ICD-10-CM | POA: Diagnosis not present

## 2019-07-12 DIAGNOSIS — N186 End stage renal disease: Secondary | ICD-10-CM | POA: Diagnosis not present

## 2019-07-12 DIAGNOSIS — Z992 Dependence on renal dialysis: Secondary | ICD-10-CM | POA: Diagnosis not present

## 2019-07-14 DIAGNOSIS — D509 Iron deficiency anemia, unspecified: Secondary | ICD-10-CM | POA: Diagnosis not present

## 2019-07-14 DIAGNOSIS — N186 End stage renal disease: Secondary | ICD-10-CM | POA: Diagnosis not present

## 2019-07-14 DIAGNOSIS — D631 Anemia in chronic kidney disease: Secondary | ICD-10-CM | POA: Diagnosis not present

## 2019-07-14 DIAGNOSIS — Z992 Dependence on renal dialysis: Secondary | ICD-10-CM | POA: Diagnosis not present

## 2019-07-14 DIAGNOSIS — N2581 Secondary hyperparathyroidism of renal origin: Secondary | ICD-10-CM | POA: Diagnosis not present

## 2019-07-15 ENCOUNTER — Encounter (INDEPENDENT_AMBULATORY_CARE_PROVIDER_SITE_OTHER): Payer: Self-pay

## 2019-07-16 DIAGNOSIS — N186 End stage renal disease: Secondary | ICD-10-CM | POA: Diagnosis not present

## 2019-07-16 DIAGNOSIS — D509 Iron deficiency anemia, unspecified: Secondary | ICD-10-CM | POA: Diagnosis not present

## 2019-07-16 DIAGNOSIS — N2581 Secondary hyperparathyroidism of renal origin: Secondary | ICD-10-CM | POA: Diagnosis not present

## 2019-07-16 DIAGNOSIS — Z992 Dependence on renal dialysis: Secondary | ICD-10-CM | POA: Diagnosis not present

## 2019-07-16 DIAGNOSIS — D631 Anemia in chronic kidney disease: Secondary | ICD-10-CM | POA: Diagnosis not present

## 2019-07-19 ENCOUNTER — Encounter (INDEPENDENT_AMBULATORY_CARE_PROVIDER_SITE_OTHER): Payer: Self-pay | Admitting: Family Medicine

## 2019-07-19 ENCOUNTER — Ambulatory Visit (INDEPENDENT_AMBULATORY_CARE_PROVIDER_SITE_OTHER): Payer: BLUE CROSS/BLUE SHIELD | Admitting: Family Medicine

## 2019-07-19 VITALS — BP 116/70 | HR 71 | Temp 98.7°F | Ht 70.0 in | Wt 197.0 lb

## 2019-07-19 DIAGNOSIS — E781 Pure hyperglyceridemia: Secondary | ICD-10-CM

## 2019-07-19 DIAGNOSIS — R748 Abnormal levels of other serum enzymes: Secondary | ICD-10-CM

## 2019-07-19 DIAGNOSIS — E559 Vitamin D deficiency, unspecified: Secondary | ICD-10-CM

## 2019-07-19 DIAGNOSIS — Z125 Encounter for screening for malignant neoplasm of prostate: Secondary | ICD-10-CM

## 2019-07-19 DIAGNOSIS — N400 Enlarged prostate without lower urinary tract symptoms: Secondary | ICD-10-CM

## 2019-07-19 DIAGNOSIS — Z23 Encounter for immunization: Secondary | ICD-10-CM

## 2019-07-19 NOTE — Progress Notes (Signed)
[facility]                       Date of Exam: 07/19/2019 4:21 PM        Patient ID: Keith Hebert is a 63 y.o. male.  Attending Physician: Sherie Don, MD        Chief Complaint:    Chief Complaint   Patient presents with    Annual Exam     fbw,colonoscopy referral               HPI:    63 year old male comes in for chronic care follow up.  He reports that he has been in overall good general health.  He does not smoke and has never smoked.  He has 1-2 drinks per week.  He is married.  He does maintain a healthy well balanced diet and does stay active with walking regularly.  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  He would like to get his flu shot and his pneumovax vaccine today.  He will get his Shingrix at his local pharmacy since he is on Medicare.  He will be getting colonoscopy done as well once the pandemic allows.              Problem List:    Patient Active Problem List   Diagnosis    Vitamin D deficiency    High triglycerides    Low serum HDL             Current Meds:    No outpatient medications have been marked as taking for the 07/19/19 encounter (Office Visit) with Sherie Don, MD.          Allergies:    No Known Allergies          Past Surgical History:    Past Surgical History:   Procedure Laterality Date    COLONOSCOPY  2010    age 59; 1 polyp repeat 5 years; has not scheduled f/u yet    HAND SURGERY  1992    L thumb    KNEE SURGERY  1983    L knee           Family History:    Family History   Problem Relation Age of Onset    Alzheimer's disease Mother     Lung cancer Father     Diabetes Sister     Heart disease Sister            Social History:    Social History     Tobacco Use    Smoking status: Never Smoker    Smokeless tobacco: Never Used   Substance Use Topics    Alcohol use: Yes     Alcohol/week: 2.0 standard drinks     Types: 1 Shots of liquor, 1 Glasses of wine per week     Frequency:  Monthly or less     Comment: 1 drink monthly, 1 drink of wine Q6 months    Drug use: Never          The following sections were reviewed this encounter by the provider:   Tobacco   Allergies   Meds   Problems   Med Hx   Surg Hx   Fam Hx              Vital Signs:    BP 116/70 (BP  Site: Left arm, Patient Position: Sitting, Cuff Size: Medium)    Pulse 71    Temp 98.7 F (37.1 C) (Temporal)    Ht 1.778 m (5\' 10" )    Wt 89.4 kg (197 lb)    BMI 28.27 kg/m          ROS:    Review of Systems   Constitutional: Negative.    HENT: Negative.    Eyes: Negative.    Respiratory: Negative.  Negative for chest tightness and shortness of breath.    Cardiovascular: Negative.  Negative for chest pain, palpitations and leg swelling.   Gastrointestinal: Negative.  Negative for blood in stool.   Endocrine: Negative.  Negative for polydipsia and polyuria.   Genitourinary: Negative.  Negative for difficulty urinating and hematuria.   Musculoskeletal: Negative.    Neurological: Negative for dizziness, facial asymmetry, weakness, light-headedness and headaches.   Hematological: Negative.    Psychiatric/Behavioral: Negative.              Physical Exam:    Physical Exam  Constitutional:       Appearance: Normal appearance.   HENT:      Right Ear: Tympanic membrane, ear canal and external ear normal.      Left Ear: Tympanic membrane, ear canal and external ear normal.   Eyes:      General: No scleral icterus.     Conjunctiva/sclera: Conjunctivae normal.   Neck:      Musculoskeletal: Normal range of motion and neck supple. No muscular tenderness.      Vascular: No carotid bruit.   Cardiovascular:      Rate and Rhythm: Normal rate and regular rhythm.      Pulses: Normal pulses.      Heart sounds: Normal heart sounds. No murmur.   Pulmonary:      Effort: Pulmonary effort is normal. No respiratory distress.      Breath sounds: Normal breath sounds. No wheezing, rhonchi or rales.   Abdominal:      General: Abdomen is flat. Bowel sounds are normal.       Palpations: Abdomen is soft. There is no mass.      Tenderness: There is no abdominal tenderness.      Hernia: No hernia is present.   Lymphadenopathy:      Cervical: No cervical adenopathy.   Skin:     General: Skin is warm and dry.   Neurological:      General: No focal deficit present.      Mental Status: He is alert.   Psychiatric:         Mood and Affect: Mood normal.         Behavior: Behavior normal.             Assessment:    1. High triglycerides  - Comprehensive metabolic panel  - Lipid panel    2. Low serum HDL  - Comprehensive metabolic panel  - Lipid panel    3. Vitamin D deficiency  - Vitamin D,25 OH, Total    4. Need for 23-polyvalent pneumococcal polysaccharide vaccine  - Pneumococcal polysaccharide vaccine 23-valent greater than or equal to 2yo subcutaneous/IM    5. Needs flu shot  - Flu vaccine QUAD 6 mos & up PRESERVED    6. Prostate cancer screening  - PSA    7. Benign prostatic hyperplasia without lower urinary tract symptoms  - PSA            Plan:  1. High triglycerides: Check fasting lipids.  Continue with efforts with healthy low fat/low cholesterol diet along with regular exercise to help get and keep lipids at goals.  2. Low HDL: Check fasting lipids.  Continue with efforts with healthy low fat/low cholesterol diet along with regular exercise to help get and keep lipids at goals.  3. Vitamin D deficiency: Check levels.  Will adjust supplementation as needed.   4. Pneumovax 23  5. Seasonal flu shot.   6. PSA: prostate cancer screening discussed and all questions answered.  Patient would like to proceed with PSA testing.           Follow-up:    Return in about 1 year (around 07/18/2020) for Chronic care with fasting labs.Sherie Don, MD

## 2019-07-20 LAB — COMPREHENSIVE METABOLIC PANEL
ALT: 43 IU/L (ref 0–44)
AST (SGOT): 32 IU/L (ref 0–40)
Albumin/Globulin Ratio: 1.7 (ref 1.2–2.2)
Albumin: 4.4 g/dL (ref 3.8–4.8)
Alkaline Phosphatase: 90 IU/L (ref 39–117)
BUN / Creatinine Ratio: 19 (ref 10–24)
BUN: 16 mg/dL (ref 8–27)
Bilirubin, Total: <0.2 mg/dL (ref 0.0–1.2)
CO2: 24 mmol/L (ref 20–29)
Calcium: 9.4 mg/dL (ref 8.6–10.2)
Chloride: 102 mmol/L (ref 96–106)
Creatinine: 0.83 mg/dL (ref 0.76–1.27)
EGFR: 107 mL/min/{1.73_m2} (ref >59)
EGFR: 92 mL/min/{1.73_m2} (ref >59)
Globulin, Total: 2.6 g/dL (ref 1.5–4.5)
Glucose: 89 mg/dL (ref 65–99)
Potassium: 4.1 mmol/L (ref 3.5–5.2)
Protein, Total: 7 g/dL (ref 6.0–8.5)
Sodium: 141 mmol/L (ref 134–144)

## 2019-07-20 LAB — SPECIMEN STATUS REPORT

## 2019-07-20 LAB — LIPID PANEL
Cholesterol / HDL Ratio: 5.6 ratio — ABNORMAL HIGH (ref 0.0–5.0)
Cholesterol: 197 mg/dL (ref 100–199)
HDL: 35 mg/dL — ABNORMAL LOW (ref >39)
LDL Chol Calculated (NIH): 133 mg/dL — ABNORMAL HIGH (ref 0–99)
Triglycerides: 158 mg/dL — ABNORMAL HIGH (ref 0–149)
VLDL Calculated: 29 mg/dL (ref 5–40)

## 2019-07-20 LAB — PSA: Prostate Specific Antigen, Total: 1 ng/mL (ref 0.0–4.0)

## 2019-07-20 LAB — VITAMIN D,25 OH,TOTAL: Vitamin D 25-Hydroxy: 26.2 ng/mL — ABNORMAL LOW (ref 30.0–100.0)

## 2019-07-21 ENCOUNTER — Inpatient Hospital Stay: Payer: Self-pay

## 2019-07-21 DIAGNOSIS — Z992 Dependence on renal dialysis: Secondary | ICD-10-CM | POA: Diagnosis not present

## 2019-07-21 DIAGNOSIS — N2581 Secondary hyperparathyroidism of renal origin: Secondary | ICD-10-CM | POA: Diagnosis not present

## 2019-07-21 DIAGNOSIS — N186 End stage renal disease: Secondary | ICD-10-CM | POA: Diagnosis not present

## 2019-07-21 DIAGNOSIS — D631 Anemia in chronic kidney disease: Secondary | ICD-10-CM | POA: Diagnosis not present

## 2019-07-21 DIAGNOSIS — D509 Iron deficiency anemia, unspecified: Secondary | ICD-10-CM | POA: Diagnosis not present

## 2019-07-22 ENCOUNTER — Other Ambulatory Visit: Payer: Self-pay | Admitting: Physician Assistant

## 2019-07-23 DIAGNOSIS — N2581 Secondary hyperparathyroidism of renal origin: Secondary | ICD-10-CM | POA: Diagnosis not present

## 2019-07-23 DIAGNOSIS — D631 Anemia in chronic kidney disease: Secondary | ICD-10-CM | POA: Diagnosis not present

## 2019-07-23 DIAGNOSIS — D509 Iron deficiency anemia, unspecified: Secondary | ICD-10-CM | POA: Diagnosis not present

## 2019-07-23 DIAGNOSIS — Z992 Dependence on renal dialysis: Secondary | ICD-10-CM | POA: Diagnosis not present

## 2019-07-23 DIAGNOSIS — N186 End stage renal disease: Secondary | ICD-10-CM | POA: Diagnosis not present

## 2019-07-26 DIAGNOSIS — D509 Iron deficiency anemia, unspecified: Secondary | ICD-10-CM | POA: Diagnosis not present

## 2019-07-26 DIAGNOSIS — Z992 Dependence on renal dialysis: Secondary | ICD-10-CM | POA: Diagnosis not present

## 2019-07-26 DIAGNOSIS — N2581 Secondary hyperparathyroidism of renal origin: Secondary | ICD-10-CM | POA: Diagnosis not present

## 2019-07-26 DIAGNOSIS — N186 End stage renal disease: Secondary | ICD-10-CM | POA: Diagnosis not present

## 2019-07-26 DIAGNOSIS — D631 Anemia in chronic kidney disease: Secondary | ICD-10-CM | POA: Diagnosis not present

## 2019-07-26 NOTE — Progress Notes (Deleted)
  POST OPERATIVE OFFICE NOTE    CC:  F/u for surgery  HPI:  This is a 63 y.o. male who is s/p plication of left arm AVF aneurysm x 2 and insertion of TDC on 06/20/2019 by Dr. Oneida Alar.  He comes in today for follow up.    No Known Allergies  Current Outpatient Medications  Medication Sig Dispense Refill  . aspirin EC 81 MG tablet Take 81 mg by mouth every evening.    Marland Kitchen atorvastatin (LIPITOR) 80 MG tablet Take 80 mg by mouth every evening.    . clopidogrel (PLAVIX) 75 MG tablet Take 75 mg by mouth every evening.    . isosorbide mononitrate (IMDUR) 60 MG 24 hr tablet Take 1 tablet by mouth in the evening 30 tablet 0  . nitroGLYCERIN (NITROSTAT) 0.4 MG SL tablet Place 0.4 mg under the tongue every 5 (five) minutes x 3 doses as needed for chest pain.    Marland Kitchen oxyCODONE (OXY IR/ROXICODONE) 5 MG immediate release tablet Take 1 tablet (5 mg total) by mouth every 6 (six) hours as needed for moderate pain. 12 tablet 0   No current facility-administered medications for this visit.      ROS:  See HPI  Physical Exam:  ***  Incision:  *** Extremities:  *** Neuro: *** Abdomen:  ***  Assessment/Plan:  This is a 63 y.o. male who is s/p: plication of left arm AVF aneurysm x 2 and insertion of TDC on 06/20/2019 by Dr. Oneida Alar here today for follow up  -***   Leontine Locket, PA-C [facility] [phone]  Clinic MD:  Early

## 2019-07-27 NOTE — Progress Notes (Signed)
  POST OPERATIVE OFFICE NOTE    CC:  F/u for surgery  HPI:  This is a 63 y.o. male who is s/p Ultrasound-guided insertion of Palindrome catheter, 23 cm, plication left arm AV fistula aneurysm x 2 on 06/20/2019 by Dr. Oneida Alar.  He is here today for incisional check. He denise pain, numbness or loss of  Motor.   No Known Allergies  Current Outpatient Medications  Medication Sig Dispense Refill  . aspirin EC 81 MG tablet Take 81 mg by mouth every evening.    Marland Kitchen atorvastatin (LIPITOR) 80 MG tablet Take 80 mg by mouth every evening.    . clopidogrel (PLAVIX) 75 MG tablet Take 75 mg by mouth every evening.    . isosorbide mononitrate (IMDUR) 60 MG 24 hr tablet Take 1 tablet by mouth in the evening 30 tablet 0  . nitroGLYCERIN (NITROSTAT) 0.4 MG SL tablet Place 0.4 mg under the tongue every 5 (five) minutes x 3 doses as needed for chest pain.    Marland Kitchen oxyCODONE (OXY IR/ROXICODONE) 5 MG immediate release tablet Take 1 tablet (5 mg total) by mouth every 6 (six) hours as needed for moderate pain. 12 tablet 0   No current facility-administered medications for this visit.      ROS:  See HPI  Physical Exam:    Incision:  Well healed without edema or drainage Extremities:  N/V/M intact with palpable thrill in the fistula.   Assessment/Plan:  This is a 63 y.o. male who is s/p: Ultrasound-guided insertion of Palindrome catheter, 23 cm, plication left arm AV fistula aneurysm x 2  The fistula may be accessed on Oct 03/2019.  He can be scheduled for [facility] removal by his Nephrologist.  F/U PRN   Roxy Horseman PA-C [facility] [phone]  Clinic MD:  Donzetta Matters

## 2019-07-28 DIAGNOSIS — D509 Iron deficiency anemia, unspecified: Secondary | ICD-10-CM | POA: Diagnosis not present

## 2019-07-28 DIAGNOSIS — I129 Hypertensive chronic kidney disease with stage 1 through stage 4 chronic kidney disease, or unspecified chronic kidney disease: Secondary | ICD-10-CM | POA: Diagnosis not present

## 2019-07-28 DIAGNOSIS — N2581 Secondary hyperparathyroidism of renal origin: Secondary | ICD-10-CM | POA: Diagnosis not present

## 2019-07-28 DIAGNOSIS — N186 End stage renal disease: Secondary | ICD-10-CM | POA: Diagnosis not present

## 2019-07-28 DIAGNOSIS — Z23 Encounter for immunization: Secondary | ICD-10-CM | POA: Diagnosis not present

## 2019-07-28 DIAGNOSIS — Z992 Dependence on renal dialysis: Secondary | ICD-10-CM | POA: Diagnosis not present

## 2019-07-28 DIAGNOSIS — D631 Anemia in chronic kidney disease: Secondary | ICD-10-CM | POA: Diagnosis not present

## 2019-07-29 ENCOUNTER — Other Ambulatory Visit: Payer: Self-pay

## 2019-07-29 ENCOUNTER — Ambulatory Visit (INDEPENDENT_AMBULATORY_CARE_PROVIDER_SITE_OTHER): Payer: Self-pay | Admitting: Physician Assistant

## 2019-07-29 VITALS — BP 138/80 | HR 78 | Temp 97.5°F | Resp 18 | Ht 71.0 in | Wt 197.0 lb

## 2019-07-29 DIAGNOSIS — Z992 Dependence on renal dialysis: Secondary | ICD-10-CM

## 2019-07-29 DIAGNOSIS — N186 End stage renal disease: Secondary | ICD-10-CM

## 2019-07-30 DIAGNOSIS — D631 Anemia in chronic kidney disease: Secondary | ICD-10-CM | POA: Diagnosis not present

## 2019-07-30 DIAGNOSIS — N186 End stage renal disease: Secondary | ICD-10-CM | POA: Diagnosis not present

## 2019-07-30 DIAGNOSIS — Z23 Encounter for immunization: Secondary | ICD-10-CM | POA: Diagnosis not present

## 2019-07-30 DIAGNOSIS — Z992 Dependence on renal dialysis: Secondary | ICD-10-CM | POA: Diagnosis not present

## 2019-07-30 DIAGNOSIS — N2581 Secondary hyperparathyroidism of renal origin: Secondary | ICD-10-CM | POA: Diagnosis not present

## 2019-07-30 DIAGNOSIS — D509 Iron deficiency anemia, unspecified: Secondary | ICD-10-CM | POA: Diagnosis not present

## 2019-08-02 DIAGNOSIS — Z23 Encounter for immunization: Secondary | ICD-10-CM | POA: Diagnosis not present

## 2019-08-02 DIAGNOSIS — N186 End stage renal disease: Secondary | ICD-10-CM | POA: Diagnosis not present

## 2019-08-02 DIAGNOSIS — D509 Iron deficiency anemia, unspecified: Secondary | ICD-10-CM | POA: Diagnosis not present

## 2019-08-02 DIAGNOSIS — D631 Anemia in chronic kidney disease: Secondary | ICD-10-CM | POA: Diagnosis not present

## 2019-08-02 DIAGNOSIS — N2581 Secondary hyperparathyroidism of renal origin: Secondary | ICD-10-CM | POA: Diagnosis not present

## 2019-08-02 DIAGNOSIS — Z992 Dependence on renal dialysis: Secondary | ICD-10-CM | POA: Diagnosis not present

## 2019-08-04 DIAGNOSIS — Z992 Dependence on renal dialysis: Secondary | ICD-10-CM | POA: Diagnosis not present

## 2019-08-04 DIAGNOSIS — Z23 Encounter for immunization: Secondary | ICD-10-CM | POA: Diagnosis not present

## 2019-08-04 DIAGNOSIS — D509 Iron deficiency anemia, unspecified: Secondary | ICD-10-CM | POA: Diagnosis not present

## 2019-08-04 DIAGNOSIS — N2581 Secondary hyperparathyroidism of renal origin: Secondary | ICD-10-CM | POA: Diagnosis not present

## 2019-08-04 DIAGNOSIS — D631 Anemia in chronic kidney disease: Secondary | ICD-10-CM | POA: Diagnosis not present

## 2019-08-04 DIAGNOSIS — N186 End stage renal disease: Secondary | ICD-10-CM | POA: Diagnosis not present

## 2019-08-05 ENCOUNTER — Encounter (INDEPENDENT_AMBULATORY_CARE_PROVIDER_SITE_OTHER): Payer: Self-pay

## 2019-08-06 DIAGNOSIS — Z992 Dependence on renal dialysis: Secondary | ICD-10-CM | POA: Diagnosis not present

## 2019-08-06 DIAGNOSIS — D509 Iron deficiency anemia, unspecified: Secondary | ICD-10-CM | POA: Diagnosis not present

## 2019-08-06 DIAGNOSIS — D631 Anemia in chronic kidney disease: Secondary | ICD-10-CM | POA: Diagnosis not present

## 2019-08-06 DIAGNOSIS — N2581 Secondary hyperparathyroidism of renal origin: Secondary | ICD-10-CM | POA: Diagnosis not present

## 2019-08-06 DIAGNOSIS — Z23 Encounter for immunization: Secondary | ICD-10-CM | POA: Diagnosis not present

## 2019-08-06 DIAGNOSIS — N186 End stage renal disease: Secondary | ICD-10-CM | POA: Diagnosis not present

## 2019-08-09 DIAGNOSIS — N186 End stage renal disease: Secondary | ICD-10-CM | POA: Diagnosis not present

## 2019-08-09 DIAGNOSIS — D509 Iron deficiency anemia, unspecified: Secondary | ICD-10-CM | POA: Diagnosis not present

## 2019-08-09 DIAGNOSIS — N2581 Secondary hyperparathyroidism of renal origin: Secondary | ICD-10-CM | POA: Diagnosis not present

## 2019-08-09 DIAGNOSIS — D631 Anemia in chronic kidney disease: Secondary | ICD-10-CM | POA: Diagnosis not present

## 2019-08-09 DIAGNOSIS — Z23 Encounter for immunization: Secondary | ICD-10-CM | POA: Diagnosis not present

## 2019-08-09 DIAGNOSIS — Z992 Dependence on renal dialysis: Secondary | ICD-10-CM | POA: Diagnosis not present

## 2019-08-11 DIAGNOSIS — N186 End stage renal disease: Secondary | ICD-10-CM | POA: Diagnosis not present

## 2019-08-11 DIAGNOSIS — Z23 Encounter for immunization: Secondary | ICD-10-CM | POA: Diagnosis not present

## 2019-08-11 DIAGNOSIS — D631 Anemia in chronic kidney disease: Secondary | ICD-10-CM | POA: Diagnosis not present

## 2019-08-11 DIAGNOSIS — D509 Iron deficiency anemia, unspecified: Secondary | ICD-10-CM | POA: Diagnosis not present

## 2019-08-11 DIAGNOSIS — N2581 Secondary hyperparathyroidism of renal origin: Secondary | ICD-10-CM | POA: Diagnosis not present

## 2019-08-11 DIAGNOSIS — Z992 Dependence on renal dialysis: Secondary | ICD-10-CM | POA: Diagnosis not present

## 2019-08-13 DIAGNOSIS — Z992 Dependence on renal dialysis: Secondary | ICD-10-CM | POA: Diagnosis not present

## 2019-08-13 DIAGNOSIS — N186 End stage renal disease: Secondary | ICD-10-CM | POA: Diagnosis not present

## 2019-08-13 DIAGNOSIS — Z23 Encounter for immunization: Secondary | ICD-10-CM | POA: Diagnosis not present

## 2019-08-13 DIAGNOSIS — D509 Iron deficiency anemia, unspecified: Secondary | ICD-10-CM | POA: Diagnosis not present

## 2019-08-13 DIAGNOSIS — D631 Anemia in chronic kidney disease: Secondary | ICD-10-CM | POA: Diagnosis not present

## 2019-08-13 DIAGNOSIS — N2581 Secondary hyperparathyroidism of renal origin: Secondary | ICD-10-CM | POA: Diagnosis not present

## 2019-08-15 DIAGNOSIS — T7840XA Allergy, unspecified, initial encounter: Secondary | ICD-10-CM | POA: Insufficient documentation

## 2019-08-17 ENCOUNTER — Encounter (INDEPENDENT_AMBULATORY_CARE_PROVIDER_SITE_OTHER): Payer: Self-pay

## 2019-08-18 DIAGNOSIS — N186 End stage renal disease: Secondary | ICD-10-CM | POA: Diagnosis not present

## 2019-08-18 DIAGNOSIS — N2581 Secondary hyperparathyroidism of renal origin: Secondary | ICD-10-CM | POA: Diagnosis not present

## 2019-08-18 DIAGNOSIS — Z992 Dependence on renal dialysis: Secondary | ICD-10-CM | POA: Diagnosis not present

## 2019-08-18 DIAGNOSIS — D631 Anemia in chronic kidney disease: Secondary | ICD-10-CM | POA: Diagnosis not present

## 2019-08-18 DIAGNOSIS — Z23 Encounter for immunization: Secondary | ICD-10-CM | POA: Diagnosis not present

## 2019-08-18 DIAGNOSIS — D509 Iron deficiency anemia, unspecified: Secondary | ICD-10-CM | POA: Diagnosis not present

## 2019-08-20 DIAGNOSIS — D509 Iron deficiency anemia, unspecified: Secondary | ICD-10-CM | POA: Diagnosis not present

## 2019-08-20 DIAGNOSIS — N186 End stage renal disease: Secondary | ICD-10-CM | POA: Diagnosis not present

## 2019-08-20 DIAGNOSIS — Z992 Dependence on renal dialysis: Secondary | ICD-10-CM | POA: Diagnosis not present

## 2019-08-20 DIAGNOSIS — Z23 Encounter for immunization: Secondary | ICD-10-CM | POA: Diagnosis not present

## 2019-08-20 DIAGNOSIS — N2581 Secondary hyperparathyroidism of renal origin: Secondary | ICD-10-CM | POA: Diagnosis not present

## 2019-08-20 DIAGNOSIS — D631 Anemia in chronic kidney disease: Secondary | ICD-10-CM | POA: Diagnosis not present

## 2019-08-23 DIAGNOSIS — Z23 Encounter for immunization: Secondary | ICD-10-CM | POA: Diagnosis not present

## 2019-08-23 DIAGNOSIS — D631 Anemia in chronic kidney disease: Secondary | ICD-10-CM | POA: Diagnosis not present

## 2019-08-23 DIAGNOSIS — D509 Iron deficiency anemia, unspecified: Secondary | ICD-10-CM | POA: Diagnosis not present

## 2019-08-23 DIAGNOSIS — N186 End stage renal disease: Secondary | ICD-10-CM | POA: Diagnosis not present

## 2019-08-23 DIAGNOSIS — N2581 Secondary hyperparathyroidism of renal origin: Secondary | ICD-10-CM | POA: Diagnosis not present

## 2019-08-23 DIAGNOSIS — Z992 Dependence on renal dialysis: Secondary | ICD-10-CM | POA: Diagnosis not present

## 2019-08-25 DIAGNOSIS — D631 Anemia in chronic kidney disease: Secondary | ICD-10-CM | POA: Diagnosis not present

## 2019-08-25 DIAGNOSIS — N186 End stage renal disease: Secondary | ICD-10-CM | POA: Diagnosis not present

## 2019-08-25 DIAGNOSIS — N2581 Secondary hyperparathyroidism of renal origin: Secondary | ICD-10-CM | POA: Diagnosis not present

## 2019-08-25 DIAGNOSIS — Z23 Encounter for immunization: Secondary | ICD-10-CM | POA: Diagnosis not present

## 2019-08-25 DIAGNOSIS — Z992 Dependence on renal dialysis: Secondary | ICD-10-CM | POA: Diagnosis not present

## 2019-08-25 DIAGNOSIS — D509 Iron deficiency anemia, unspecified: Secondary | ICD-10-CM | POA: Diagnosis not present

## 2019-08-27 DIAGNOSIS — D631 Anemia in chronic kidney disease: Secondary | ICD-10-CM | POA: Diagnosis not present

## 2019-08-27 DIAGNOSIS — D509 Iron deficiency anemia, unspecified: Secondary | ICD-10-CM | POA: Diagnosis not present

## 2019-08-27 DIAGNOSIS — Z992 Dependence on renal dialysis: Secondary | ICD-10-CM | POA: Diagnosis not present

## 2019-08-27 DIAGNOSIS — N186 End stage renal disease: Secondary | ICD-10-CM | POA: Diagnosis not present

## 2019-08-27 DIAGNOSIS — N2581 Secondary hyperparathyroidism of renal origin: Secondary | ICD-10-CM | POA: Diagnosis not present

## 2019-08-27 DIAGNOSIS — Z23 Encounter for immunization: Secondary | ICD-10-CM | POA: Diagnosis not present

## 2019-08-28 DIAGNOSIS — Z992 Dependence on renal dialysis: Secondary | ICD-10-CM | POA: Diagnosis not present

## 2019-08-28 DIAGNOSIS — I129 Hypertensive chronic kidney disease with stage 1 through stage 4 chronic kidney disease, or unspecified chronic kidney disease: Secondary | ICD-10-CM | POA: Diagnosis not present

## 2019-08-28 DIAGNOSIS — N186 End stage renal disease: Secondary | ICD-10-CM | POA: Diagnosis not present

## 2019-08-29 ENCOUNTER — Inpatient Hospital Stay: Payer: Self-pay

## 2019-08-30 DIAGNOSIS — N186 End stage renal disease: Secondary | ICD-10-CM | POA: Diagnosis not present

## 2019-08-30 DIAGNOSIS — D509 Iron deficiency anemia, unspecified: Secondary | ICD-10-CM | POA: Diagnosis not present

## 2019-08-30 DIAGNOSIS — N2581 Secondary hyperparathyroidism of renal origin: Secondary | ICD-10-CM | POA: Diagnosis not present

## 2019-08-30 DIAGNOSIS — Z992 Dependence on renal dialysis: Secondary | ICD-10-CM | POA: Diagnosis not present

## 2019-09-01 ENCOUNTER — Other Ambulatory Visit: Payer: Self-pay | Admitting: Cardiothoracic Surgery

## 2019-09-01 DIAGNOSIS — Z992 Dependence on renal dialysis: Secondary | ICD-10-CM | POA: Diagnosis not present

## 2019-09-01 DIAGNOSIS — I2510 Atherosclerotic heart disease of native coronary artery without angina pectoris: Secondary | ICD-10-CM

## 2019-09-01 DIAGNOSIS — N2581 Secondary hyperparathyroidism of renal origin: Secondary | ICD-10-CM | POA: Diagnosis not present

## 2019-09-01 DIAGNOSIS — N186 End stage renal disease: Secondary | ICD-10-CM | POA: Diagnosis not present

## 2019-09-01 DIAGNOSIS — D509 Iron deficiency anemia, unspecified: Secondary | ICD-10-CM | POA: Diagnosis not present

## 2019-09-03 DIAGNOSIS — D509 Iron deficiency anemia, unspecified: Secondary | ICD-10-CM | POA: Diagnosis not present

## 2019-09-03 DIAGNOSIS — N2581 Secondary hyperparathyroidism of renal origin: Secondary | ICD-10-CM | POA: Diagnosis not present

## 2019-09-03 DIAGNOSIS — Z992 Dependence on renal dialysis: Secondary | ICD-10-CM | POA: Diagnosis not present

## 2019-09-03 DIAGNOSIS — N186 End stage renal disease: Secondary | ICD-10-CM | POA: Diagnosis not present

## 2019-09-06 DIAGNOSIS — N2581 Secondary hyperparathyroidism of renal origin: Secondary | ICD-10-CM | POA: Diagnosis not present

## 2019-09-06 DIAGNOSIS — Z992 Dependence on renal dialysis: Secondary | ICD-10-CM | POA: Diagnosis not present

## 2019-09-06 DIAGNOSIS — N186 End stage renal disease: Secondary | ICD-10-CM | POA: Diagnosis not present

## 2019-09-06 DIAGNOSIS — D509 Iron deficiency anemia, unspecified: Secondary | ICD-10-CM | POA: Diagnosis not present

## 2019-09-08 DIAGNOSIS — N186 End stage renal disease: Secondary | ICD-10-CM | POA: Diagnosis not present

## 2019-09-08 DIAGNOSIS — N2581 Secondary hyperparathyroidism of renal origin: Secondary | ICD-10-CM | POA: Diagnosis not present

## 2019-09-08 DIAGNOSIS — Z992 Dependence on renal dialysis: Secondary | ICD-10-CM | POA: Diagnosis not present

## 2019-09-08 DIAGNOSIS — D509 Iron deficiency anemia, unspecified: Secondary | ICD-10-CM | POA: Diagnosis not present

## 2019-09-09 ENCOUNTER — Other Ambulatory Visit: Payer: Self-pay | Admitting: *Deleted

## 2019-09-10 DIAGNOSIS — D509 Iron deficiency anemia, unspecified: Secondary | ICD-10-CM | POA: Diagnosis not present

## 2019-09-10 DIAGNOSIS — N2581 Secondary hyperparathyroidism of renal origin: Secondary | ICD-10-CM | POA: Diagnosis not present

## 2019-09-10 DIAGNOSIS — N186 End stage renal disease: Secondary | ICD-10-CM | POA: Diagnosis not present

## 2019-09-10 DIAGNOSIS — Z992 Dependence on renal dialysis: Secondary | ICD-10-CM | POA: Diagnosis not present

## 2019-09-13 ENCOUNTER — Inpatient Hospital Stay (HOSPITAL_COMMUNITY): Admission: RE | Admit: 2019-09-13 | Payer: Medicare Other | Source: Ambulatory Visit

## 2019-09-13 DIAGNOSIS — N2581 Secondary hyperparathyroidism of renal origin: Secondary | ICD-10-CM | POA: Diagnosis not present

## 2019-09-13 DIAGNOSIS — N186 End stage renal disease: Secondary | ICD-10-CM | POA: Diagnosis not present

## 2019-09-13 DIAGNOSIS — Z992 Dependence on renal dialysis: Secondary | ICD-10-CM | POA: Diagnosis not present

## 2019-09-13 DIAGNOSIS — D509 Iron deficiency anemia, unspecified: Secondary | ICD-10-CM | POA: Diagnosis not present

## 2019-09-14 ENCOUNTER — Inpatient Hospital Stay (HOSPITAL_COMMUNITY)
Admission: RE | Admit: 2019-09-14 | Discharge: 2019-09-14 | Disposition: A | Discharge status: Home / Self Care | Payer: Medicare Other | Source: Ambulatory Visit | Attending: Surgery | Admitting: Surgery

## 2019-09-15 ENCOUNTER — Other Ambulatory Visit: Payer: Self-pay | Admitting: Cardiology

## 2019-09-15 DIAGNOSIS — Z992 Dependence on renal dialysis: Secondary | ICD-10-CM | POA: Diagnosis not present

## 2019-09-15 DIAGNOSIS — D509 Iron deficiency anemia, unspecified: Secondary | ICD-10-CM | POA: Diagnosis not present

## 2019-09-15 DIAGNOSIS — N2581 Secondary hyperparathyroidism of renal origin: Secondary | ICD-10-CM | POA: Diagnosis not present

## 2019-09-15 DIAGNOSIS — N186 End stage renal disease: Secondary | ICD-10-CM | POA: Diagnosis not present

## 2019-09-17 DIAGNOSIS — N2581 Secondary hyperparathyroidism of renal origin: Secondary | ICD-10-CM | POA: Diagnosis not present

## 2019-09-17 DIAGNOSIS — D509 Iron deficiency anemia, unspecified: Secondary | ICD-10-CM | POA: Diagnosis not present

## 2019-09-17 DIAGNOSIS — Z992 Dependence on renal dialysis: Secondary | ICD-10-CM | POA: Diagnosis not present

## 2019-09-17 DIAGNOSIS — N186 End stage renal disease: Secondary | ICD-10-CM | POA: Diagnosis not present

## 2019-09-18 NOTE — Progress Notes (Deleted)
Cardiology Office Note    Date:  09/18/2019   ID:  [place], [place] [birth year], MRN [id number]  PCP:  [name], MD  Cardiologist:  Dr. [name]  No chief complaint on file.   History of Present Illness:  Kenneth [name] is a 63 y.o. male with past medical history of diastolic heart failure, heavy EtOH use, atrial fibrillation and atrial flutter not a candidate for anticoagulation due to poor compliance, ESRD on HD TTS, hypertension and history of nonsustained VT.  His EF has been low in 25 to 30% range in 2017.  Repeat echocardiogram in February 2019 showed normalization of LVEF of 65 to 70%.  He was admitted in May 2019 with acute hypoxic respiratory failure due to pulmonary edema from noncompliance with hemodialysis.  While in the emergency room, he suffered a PEA arrest.  He received empiric treatment for hyperkalemia and shock for wide complex tachycardia.  He was also treated with amiodarone as well.  He achieved ROSC after 10 minutes of ACLS, following this he had A. fib with RVR and ultimately reverted to sinus rhythm.  Cardiology was not consulted at the time.  Echocardiogram obtained on 03/09/2018 demonstrated severe LVH, normal EF of 55 to 60%, moderately decreased RV systolic function and a thickened aortic and mitral valve.  There was some mention in the echocardiogram for consideration of evaluation for cardiac amyloidosis including PYP scan, this never took place.  Patient was admitted in early November 2019 with chest pain.  During the hospitalization, he had a significant T wave inversion in inferior and lateral leads concerning for progression of coronary artery disease.  He ended up having cardiac catheterization on 09/02/2018 which showed severe multivessel CAD involving distal left main, ostial to proximal LAD, ostial to proximal left circumflex, mid RCA.  CT surgery was consulted.  Patient was seen by Dr. Prescott Gum on 09/03/2018, given his comorbidities, although CABG likely  will improve his cardiac issue but likely will result in further severe problems with multiorgan failure and dysfunction.  Therefore he was turned down for bypass surgery.  He was felt to be a very high risk patient for PCI and the risk was prohibitive especially in light of his compliance issue.  The procedure is also high risk as he would require unprotected left main intervention, atherectomy of LAD and bifurcation stenting.  He was eventually discharged on medical therapy.  However he returned to the hospital on 09/17/2018 with sudden onset of chest pain.  Initial EKG showed severe ST depression in inferolateral leads.  A code STEMI was called however subsequently canceled.  He was admitted for observation, cardiac enzyme was negative.  He was continue on aspirin, Plavix and statin.  When seen by Almyra Deforest PA-C in January 2020 was having difficulty obtaining Plavix and lipitor due to cost. These were renewed. He was admitted for revision of AV fistula in August.    Past Medical History:  Diagnosis Date   Acute CHF ([place]) 01/2018   Cardiomyopathy secondary    likely related to HTN heart disease; possibly ETOH related as well   Chronic combined systolic and diastolic heart failure ([place])    Echocardiogram 09/22/11: Moderate LVH, EF 99991111, grade 3 diastolic dysfunction, mild MR, moderate to severe LAE, mild RVE, mild to moderate TR, small to moderate pericardial effusion   Coronary artery disease    not felt to be candidate for CABG 08/2018; medical thearpy recomended due to high risk of PCI  Dysrhythmia    aflutter 04/2016, afib 02/2018, not felt to be a candidate for anticoagulation due to non-compliance and ETOH   ESRD (end stage renal disease) on dialysis [facility])    due to hypertensive nephrosclerosis; TTS; [facility]. (09/01/2018)   History of alcohol abuse    Hypertension    Myocardial infarction [facility])    " mild " per daughter   PEA (Pulseless electrical activity) ([place])    PEA arrest  03/09/18, treated empirically for hyperkalemia, shock x1 for WCT, given amiodarone, ROSC after 10 min of ACLS    Past Surgical History:  Procedure Laterality Date   AV FISTULA PLACEMENT Left 05/14/2016   Procedure: LEFT ARM BASILIC VEIN TRANSPOSITION;  Surgeon: Rosetta Posner, MD;  Location: [place];  Service: Vascular;  Laterality: Left;   INSERTION OF DIALYSIS CATHETER N/A 06/20/2019   Procedure: INSERTION OF TUNNELED  DIALYSIS CATHETER;  Surgeon: Elam Dutch, MD;  Location: [place];  Service: Vascular;  Laterality: N/A;   LEFT HEART CATH AND CORONARY ANGIOGRAPHY N/A 09/02/2018   Procedure: LEFT HEART CATH AND CORONARY ANGIOGRAPHY;  Surgeon: Nelva Bush, MD;  Location: [place] [facility];  Service: Cardiovascular;  Laterality: N/A;   PERIPHERAL VASCULAR CATHETERIZATION N/A 05/13/2016   Procedure: Dialysis/Perma Catheter Insertion;  Surgeon: Serafina Mitchell, MD;  Location: [place] CV [facility];  Service: Cardiovascular;  Laterality: N/A;   REVISION OF ARTERIOVENOUS GORETEX GRAFT Left 0000000   Procedure: PLICATION OF ARTERIOVENOUS FISTULA LEFT ARM;  Surgeon: Elam Dutch, MD;  [facility];  Service: Vascular;  Laterality: Left;    Current Medications: Outpatient Medications Prior to Visit  Medication Sig Dispense Refill   aspirin EC 81 MG tablet Take 81 mg by mouth every evening.     atorvastatin (LIPITOR) 80 MG tablet Take 80 mg by mouth every evening.     clopidogrel (PLAVIX) 75 MG tablet Take 75 mg by mouth every evening.     isosorbide mononitrate (IMDUR) 60 MG 24 hr tablet Take 1 tablet by mouth in the evening 90 tablet 0   nitroGLYCERIN (NITROSTAT) 0.4 MG SL tablet Place 0.4 mg under the tongue every 5 (five) minutes x 3 doses as needed for chest pain.     oxyCODONE (OXY IR/ROXICODONE) 5 MG immediate release tablet Take 1 tablet (5 mg total) by mouth every 6 (six) hours as needed for moderate pain. 12 tablet 0   No facility-administered medications prior to  visit.      Allergies:   Patient has no known allergies.   Social History   Socioeconomic History   Marital status: Divorced    Spouse name: Not on file   Number of children: Not on file   Years of education: Not on file   Highest education level: Not on file  Occupational History   Not on file  Social Needs   Financial resource strain: Not on file   Food insecurity    Worry: Not on file    Inability: Not on file   Transportation needs    Medical: Not on file    Non-medical: Not on file  Tobacco Use   Smoking status: Former Smoker    Packs/day: 2.00    Years: 48.00    Pack years: 96.00    Types: Cigars    Quit date: 10/27/2017    Years since quitting: 1.8   Smokeless tobacco: Former Systems developer    Types: Chew  Substance and Sexual Activity   Alcohol use: Not Currently  Frequency: Never   Drug use: No   Sexual activity: Not Currently  Lifestyle   Physical activity    Days per week: Not on file    Minutes per session: Not on file   Stress: Not on file  Relationships   Social connections    Talks on phone: Not on file    Gets together: Not on file    Attends religious service: Not on file    Active member of club or organization: Not on file    Attends meetings of clubs or organizations: Not on file    Relationship status: Not on file  Other Topics Concern   Not on file  Social History Narrative   Not on file     Family History:  The patient's family history includes Cirrhosis in his father; Emphysema in his mother.   ROS:   Please see the history of present illness.    ROS All other systems reviewed and are negative.   PHYSICAL EXAM:   VS:  There were no vitals taken for this visit.   GEN: Well nourished, well developed, in no acute distress  HEENT: normal  Neck: no JVD, carotid bruits, or masses Cardiac: RRR; no murmurs, rubs, or gallops,no edema  Respiratory:  clear to auscultation bilaterally, normal work of breathing GI: soft,  nontender, nondistended, + BS MS: no deformity or atrophy  Skin: warm and dry, no rash Neuro:  Alert and Oriented x 3, Strength and sensation are intact Psych: euthymic mood, full affect  Wt Readings from Last 3 Encounters:  07/29/19 197 lb (89.4 kg)  06/20/19 202 lb 13.2 oz (92 kg)  06/16/19 203 lb (92.1 kg)      Studies/Labs Reviewed:   EKG:  EKG is not ordered today.    Recent Labs: 09/19/2018: BUN 42 11/19/2018: ALT 8 06/20/2019: Creatinine, Ser 11.85; Hemoglobin 11.4; Platelets 169; Potassium 3.9; Sodium 137   Lipid Panel    Component Value Date/Time   CHOL 193 11/19/2018 0959   TRIG 120 11/19/2018 0959   HDL 50 11/19/2018 0959   CHOLHDL 3.9 11/19/2018 0959   CHOLHDL 4.3 09/17/2018 0604   VLDL 37 09/17/2018 0604   LDLCALC 119 (H) 11/19/2018 0959    Additional studies/ records that were reviewed today include:   Cath 09/02/2018 Conclusions: 1. Severe multivessel coronary artery disease involving the distal LMCA, ostial/proximal LAD, ostial and proximal LCx, and mid RCA. 2. Normal left ventricular filling pressure. 3. Scattered atherosclerotic calcification of the ascending aorta.  Recommendations: 1. Cardiac surgery consultation for CABG.  CT chest may be helpful to further assess degree of aortic calcification. 2. Initiate heparin infusion 8 hours after sheath removal. 3. Aggressive secondary prevention, including indefinite aspirin 81 mg daily and high-intensity statin therapy. 4. Plan for hemodialysis tomorrow.   Echo 09/18/2018 LV EF: 55% -   60% Study Conclusions  - Left ventricle: The cavity size was normal. Wall thickness was   increased in a pattern of mild LVH. Systolic function was normal.   The estimated ejection fraction was in the range of 55% to 60%.   Doppler parameters are consistent with abnormal left ventricular   relaxation (grade 1 diastolic dysfunction). The E/e&' ratio is <8,   suggesting normal LV filling pressure. - Aortic valve:  Poorly visualized. Mildly calcified leaflets. There   was no stenosis. - Mitral valve: Calcified annulus. Mildly thickened leaflets .   There was trivial regurgitation. - Left atrium: The atrium was mildly dilated.  Impressions:  -  Compared to a prior study on 09/01/2018, there are no significant   changes.    ASSESSMENT:    No diagnosis found.   PLAN:  In order of problems listed above:  1. CAD: Severe multivessel disease noted on last cardiac catheterization in November.  Patient would not be a candidate for bypass surgery.  He is being treated medically.  He continued to have intermittent chest discomfort about once a week.  His blood pressure does not allow me to further uptitrate antianginal medication.  Continue Imdur 60 mg daily and add Toprol-XL.  He ran out of Plavix, I will refill Plavix.  He does have some burning sensation in the chest, this is more consistent with acid reflux symptom, I will start him on a course of Protonix 40 mg daily  2. Chronic diastolic heart failure: Appears to be euvolemic on physical exam.  Fluid level managed using dialysis  3. End-stage renal disease on hemodialysis: Blood pressure stable  4. History of nonsustained VT: No recent recurrence of dizziness.  Despite three-vessel disease, his recent echocardiogram showed a normal ejection fraction.    Medication Adjustments/Labs and Tests Ordered: Current medicines are reviewed at length with the patient today.  Concerns regarding medicines are outlined above.  Medication changes, Labs and Tests ordered today are listed in the Patient Instructions below. There are no Patient Instructions on file for this visit.   Signed, Makai Dumond [name], MD  09/18/2019 7:01 AM    [address], [place], [place]  [address] Phone: [phone]; Fax: [phone]

## 2019-09-19 DIAGNOSIS — N2581 Secondary hyperparathyroidism of renal origin: Secondary | ICD-10-CM | POA: Diagnosis not present

## 2019-09-19 DIAGNOSIS — Z992 Dependence on renal dialysis: Secondary | ICD-10-CM | POA: Diagnosis not present

## 2019-09-19 DIAGNOSIS — D509 Iron deficiency anemia, unspecified: Secondary | ICD-10-CM | POA: Diagnosis not present

## 2019-09-19 DIAGNOSIS — N186 End stage renal disease: Secondary | ICD-10-CM | POA: Diagnosis not present

## 2019-09-21 ENCOUNTER — Ambulatory Visit: Payer: Medicare Other | Admitting: Cardiology

## 2019-09-21 DIAGNOSIS — N186 End stage renal disease: Secondary | ICD-10-CM | POA: Diagnosis not present

## 2019-09-21 DIAGNOSIS — D509 Iron deficiency anemia, unspecified: Secondary | ICD-10-CM | POA: Diagnosis not present

## 2019-09-21 DIAGNOSIS — Z992 Dependence on renal dialysis: Secondary | ICD-10-CM | POA: Diagnosis not present

## 2019-09-21 DIAGNOSIS — N2581 Secondary hyperparathyroidism of renal origin: Secondary | ICD-10-CM | POA: Diagnosis not present

## 2019-09-24 DIAGNOSIS — N186 End stage renal disease: Secondary | ICD-10-CM | POA: Diagnosis not present

## 2019-09-24 DIAGNOSIS — Z992 Dependence on renal dialysis: Secondary | ICD-10-CM | POA: Diagnosis not present

## 2019-09-24 DIAGNOSIS — N2581 Secondary hyperparathyroidism of renal origin: Secondary | ICD-10-CM | POA: Diagnosis not present

## 2019-09-24 DIAGNOSIS — D509 Iron deficiency anemia, unspecified: Secondary | ICD-10-CM | POA: Diagnosis not present

## 2019-10-03 ENCOUNTER — Telehealth: Payer: Self-pay | Admitting: *Deleted

## 2019-10-03 ENCOUNTER — Other Ambulatory Visit: Payer: Self-pay | Admitting: *Deleted

## 2019-10-03 ENCOUNTER — Encounter: Payer: Self-pay | Admitting: *Deleted

## 2019-10-03 NOTE — Telephone Encounter (Signed)
Confirmed faxed received by Caren Griffins at [facility].Asked her to stress to patient importance of making appt. Times.

## 2019-10-03 NOTE — Progress Notes (Signed)
Rescheduled date for [facility] removal with patient's daughter ERICA. Instructions letter for Nasal Swab and procedure faxed to [place] for patient and his daughter. Room confirmed with Laverne at [facility].

## 2019-10-04 ENCOUNTER — Other Ambulatory Visit: Payer: Self-pay | Admitting: *Deleted

## 2019-10-07 ENCOUNTER — Other Ambulatory Visit (HOSPITAL_COMMUNITY)
Admission: RE | Admit: 2019-10-07 | Discharge: 2019-10-07 | Disposition: A | Discharge status: Home / Self Care | Payer: Medicare Other | Source: Ambulatory Visit | Attending: Vascular Surgery | Admitting: Vascular Surgery

## 2019-10-07 DIAGNOSIS — Z20828 Contact with and (suspected) exposure to other viral communicable diseases: Secondary | ICD-10-CM | POA: Diagnosis not present

## 2019-10-07 DIAGNOSIS — Z01812 Encounter for preprocedural laboratory examination: Secondary | ICD-10-CM | POA: Diagnosis present

## 2019-10-07 LAB — SARS CORONAVIRUS 2 (TAT 6-24 HRS): SARS Coronavirus 2: NEGATIVE

## 2019-10-10 ENCOUNTER — Other Ambulatory Visit: Payer: Self-pay

## 2019-10-10 ENCOUNTER — Ambulatory Visit (HOSPITAL_COMMUNITY)
Admission: RE | Admit: 2019-10-10 | Discharge: 2019-10-10 | Disposition: A | Discharge status: Home / Self Care | Payer: Medicare Other | Source: Ambulatory Visit | Attending: Vascular Surgery | Admitting: Vascular Surgery

## 2019-10-10 DIAGNOSIS — Z992 Dependence on renal dialysis: Secondary | ICD-10-CM | POA: Insufficient documentation

## 2019-10-10 DIAGNOSIS — I252 Old myocardial infarction: Secondary | ICD-10-CM | POA: Insufficient documentation

## 2019-10-10 DIAGNOSIS — I2510 Atherosclerotic heart disease of native coronary artery without angina pectoris: Secondary | ICD-10-CM | POA: Diagnosis not present

## 2019-10-10 DIAGNOSIS — I132 Hypertensive heart and chronic kidney disease with heart failure and with stage 5 chronic kidney disease, or end stage renal disease: Secondary | ICD-10-CM | POA: Insufficient documentation

## 2019-10-10 DIAGNOSIS — N186 End stage renal disease: Secondary | ICD-10-CM | POA: Diagnosis not present

## 2019-10-10 DIAGNOSIS — I5042 Chronic combined systolic (congestive) and diastolic (congestive) heart failure: Secondary | ICD-10-CM | POA: Diagnosis not present

## 2019-10-10 DIAGNOSIS — Z79899 Other long term (current) drug therapy: Secondary | ICD-10-CM | POA: Diagnosis not present

## 2019-10-10 DIAGNOSIS — Z87891 Personal history of nicotine dependence: Secondary | ICD-10-CM | POA: Insufficient documentation

## 2019-10-10 DIAGNOSIS — Z7902 Long term (current) use of antithrombotics/antiplatelets: Secondary | ICD-10-CM | POA: Insufficient documentation

## 2019-10-10 DIAGNOSIS — I4891 Unspecified atrial fibrillation: Secondary | ICD-10-CM | POA: Diagnosis not present

## 2019-10-10 DIAGNOSIS — I429 Cardiomyopathy, unspecified: Secondary | ICD-10-CM | POA: Diagnosis not present

## 2019-10-10 DIAGNOSIS — N185 Chronic kidney disease, stage 5: Secondary | ICD-10-CM

## 2019-10-10 DIAGNOSIS — I4892 Unspecified atrial flutter: Secondary | ICD-10-CM | POA: Insufficient documentation

## 2019-10-10 DIAGNOSIS — Z8674 Personal history of sudden cardiac arrest: Secondary | ICD-10-CM | POA: Diagnosis not present

## 2019-10-10 DIAGNOSIS — Z7982 Long term (current) use of aspirin: Secondary | ICD-10-CM | POA: Insufficient documentation

## 2019-10-10 MED ORDER — LIDOCAINE HCL (PF) 1 % IJ SOLN
INTRAMUSCULAR | Status: AC
  Filled 2019-10-10: qty 5

## 2019-10-10 NOTE — Progress Notes (Signed)
  Catheter Removal Procedure Note    Diagnosis: ESRD  Plan:  Remove right diatek catheter  Consent signed:  Yes.   Time out completed:  Yes.   Coumadin:  No. PT/INR (if applicable):   Other labs:  Procedure: 1.  Sterile prepping and draping over catheter area 2. 9 ml 2% lidocaine plain instilled at removal site. 3.  right catheter removed in its entirety with cuff in tact. 4.  Complications:  none 5. Tip of catheter sent for culture:  No.   Patient tolerated procedure well:  Yes.   Pressure held, no bleeding noted, dressing applied Instructions given to the pt regarding wound care and bleeding.  OtherLeontine Locket, PA-C 10/10/2019 11:20 AM [phone]

## 2019-10-10 NOTE — Progress Notes (Signed)
H&P    CC:  Catheter removal   HPI:  This is a 63 y.o. male in need of tunneled dialysis catheter removal.  He underwent plication of his fistula and TDC placement on 06/20/2019 by Dr. Oneida Alar.  He states his fistula is working well and here for removal of his catheter.    Past Medical History:  Diagnosis Date  . Acute CHF ([place]) 01/2018  . Cardiomyopathy secondary    likely related to HTN heart disease; possibly ETOH related as well  . Chronic combined systolic and diastolic heart failure ([facility])    Echocardiogram 09/22/11: Moderate LVH, EF 99991111, grade 3 diastolic dysfunction, mild MR, moderate to severe LAE, mild RVE, mild to moderate TR, small to moderate pericardial effusion  . Coronary artery disease    not felt to be candidate for CABG 08/2018; medical thearpy recomended due to high risk of PCI   . Dysrhythmia    aflutter 04/2016, afib 02/2018, not felt to be a candidate for anticoagulation due to non-compliance and ETOH  . ESRD (end stage renal disease) on dialysis ([place])    due to hypertensive nephrosclerosis; TTS; [address] (09/01/2018)  . History of alcohol abuse   . Hypertension   . Myocardial infarction [facility])    " mild " per daughter  . PEA (Pulseless electrical activity) ([place])    PEA arrest 03/09/18, treated empirically for hyperkalemia, shock x1 for WCT, given amiodarone, ROSC after 10 min of ACLS    FH:  Non-Contributory  Social History   Socioeconomic History  . Marital status: Divorced    Spouse name: Not on file  . Number of children: Not on file  . Years of education: Not on file  . Highest education level: Not on file  Occupational History  . Not on file  Tobacco Use  . Smoking status: Former Smoker    Packs/day: 2.00    Years: 48.00    Pack years: 96.00    Types: Cigars    Quit date: 10/27/2017    Years since quitting: 1.9  . Smokeless tobacco: Former Systems developer    Types: Chew  Substance and Sexual Activity  . Alcohol use: Not Currently  . Drug use: No  .  Sexual activity: Not Currently  Other Topics Concern  . Not on file  Social History Narrative  . Not on file   Social Determinants of Health   Financial Resource Strain:   . Difficulty of Paying Living Expenses: Not on file  Food Insecurity:   . Worried About Charity fundraiser in the Last Year: Not on file  . Ran Out of Food in the Last Year: Not on file  Transportation Needs:   . Lack of Transportation (Medical): Not on file  . Lack of Transportation (Non-Medical): Not on file  Physical Activity:   . Days of Exercise per Week: Not on file  . Minutes of Exercise per Session: Not on file  Stress:   . Feeling of Stress : Not on file  Social Connections:   . Frequency of Communication with Friends and Family: Not on file  . Frequency of Social Gatherings with Friends and Family: Not on file  . Attends Religious Services: Not on file  . Active Member of Clubs or Organizations: Not on file  . Attends Archivist Meetings: Not on file  . Marital Status: Not on file  Intimate Partner Violence:   . Fear of Current or Ex-Partner: Not on file  . Emotionally Abused:  Not on file  . Physically Abused: Not on file  . Sexually Abused: Not on file    No Known Allergies  Current Outpatient Medications  Medication Sig Dispense Refill  . aspirin EC 81 MG tablet Take 81 mg by mouth every evening.    Marland Kitchen atorvastatin (LIPITOR) 80 MG tablet Take 80 mg by mouth every evening.    . clopidogrel (PLAVIX) 75 MG tablet Take 75 mg by mouth every evening.    . isosorbide mononitrate (IMDUR) 60 MG 24 hr tablet Take 1 tablet by mouth in the evening 90 tablet 0  . nitroGLYCERIN (NITROSTAT) 0.4 MG SL tablet Place 0.4 mg under the tongue every 5 (five) minutes x 3 doses as needed for chest pain.    Marland Kitchen oxyCODONE (OXY IR/ROXICODONE) 5 MG immediate release tablet Take 1 tablet (5 mg total) by mouth every 6 (six) hours as needed for moderate pain. 12 tablet 0   Current Facility-Administered  Medications  Medication Dose Route Frequency Provider Last Rate Last Admin  . lidocaine (PF) (XYLOCAINE) 1 % injection             ROS:  See HPI  PHYSICAL EXAM  Vitals:   10/10/19 1016  BP: 107/81  Pulse: 76  Resp: 18  Temp: (!) 96.9 F (36.1 C)  SpO2: 100%    Gen:  Well developed well nourished HEENT:  normocephalic Neck:  Right IJ TDC Heart:  regular Lungs:  Non-labored Extremities:  +thrill in fistula Skin:  No obvious rashes Neuro:  In tact  Lab:  n/a  Impression: This is a 63 y.o. male here for diatek catheter removal  Plan:  Removal of right diatek catheter  Leontine Locket, PA-C [facility] [phone] 10/10/2019 10:34 AM

## 2019-10-28 DIAGNOSIS — N186 End stage renal disease: Secondary | ICD-10-CM | POA: Diagnosis not present

## 2019-10-28 DIAGNOSIS — Z992 Dependence on renal dialysis: Secondary | ICD-10-CM | POA: Diagnosis not present

## 2019-10-28 DIAGNOSIS — I129 Hypertensive chronic kidney disease with stage 1 through stage 4 chronic kidney disease, or unspecified chronic kidney disease: Secondary | ICD-10-CM | POA: Diagnosis not present

## 2019-10-30 DIAGNOSIS — D631 Anemia in chronic kidney disease: Secondary | ICD-10-CM | POA: Diagnosis not present

## 2019-10-30 DIAGNOSIS — N2581 Secondary hyperparathyroidism of renal origin: Secondary | ICD-10-CM | POA: Diagnosis not present

## 2019-10-30 DIAGNOSIS — Z992 Dependence on renal dialysis: Secondary | ICD-10-CM | POA: Diagnosis not present

## 2019-10-30 DIAGNOSIS — D509 Iron deficiency anemia, unspecified: Secondary | ICD-10-CM | POA: Diagnosis not present

## 2019-10-30 DIAGNOSIS — N186 End stage renal disease: Secondary | ICD-10-CM | POA: Diagnosis not present

## 2019-11-01 DIAGNOSIS — Z992 Dependence on renal dialysis: Secondary | ICD-10-CM | POA: Diagnosis not present

## 2019-11-01 DIAGNOSIS — N2581 Secondary hyperparathyroidism of renal origin: Secondary | ICD-10-CM | POA: Diagnosis not present

## 2019-11-01 DIAGNOSIS — N186 End stage renal disease: Secondary | ICD-10-CM | POA: Diagnosis not present

## 2019-11-01 DIAGNOSIS — D631 Anemia in chronic kidney disease: Secondary | ICD-10-CM | POA: Diagnosis not present

## 2019-11-01 DIAGNOSIS — D509 Iron deficiency anemia, unspecified: Secondary | ICD-10-CM | POA: Diagnosis not present

## 2019-11-03 DIAGNOSIS — N186 End stage renal disease: Secondary | ICD-10-CM | POA: Diagnosis not present

## 2019-11-03 DIAGNOSIS — D631 Anemia in chronic kidney disease: Secondary | ICD-10-CM | POA: Diagnosis not present

## 2019-11-03 DIAGNOSIS — N2581 Secondary hyperparathyroidism of renal origin: Secondary | ICD-10-CM | POA: Diagnosis not present

## 2019-11-03 DIAGNOSIS — Z992 Dependence on renal dialysis: Secondary | ICD-10-CM | POA: Diagnosis not present

## 2019-11-03 DIAGNOSIS — D509 Iron deficiency anemia, unspecified: Secondary | ICD-10-CM | POA: Diagnosis not present

## 2019-11-05 DIAGNOSIS — Z992 Dependence on renal dialysis: Secondary | ICD-10-CM | POA: Diagnosis not present

## 2019-11-05 DIAGNOSIS — D509 Iron deficiency anemia, unspecified: Secondary | ICD-10-CM | POA: Diagnosis not present

## 2019-11-05 DIAGNOSIS — N2581 Secondary hyperparathyroidism of renal origin: Secondary | ICD-10-CM | POA: Diagnosis not present

## 2019-11-05 DIAGNOSIS — N186 End stage renal disease: Secondary | ICD-10-CM | POA: Diagnosis not present

## 2019-11-05 DIAGNOSIS — D631 Anemia in chronic kidney disease: Secondary | ICD-10-CM | POA: Diagnosis not present

## 2019-11-08 DIAGNOSIS — N186 End stage renal disease: Secondary | ICD-10-CM | POA: Diagnosis not present

## 2019-11-08 DIAGNOSIS — D509 Iron deficiency anemia, unspecified: Secondary | ICD-10-CM | POA: Diagnosis not present

## 2019-11-08 DIAGNOSIS — N2581 Secondary hyperparathyroidism of renal origin: Secondary | ICD-10-CM | POA: Diagnosis not present

## 2019-11-08 DIAGNOSIS — Z992 Dependence on renal dialysis: Secondary | ICD-10-CM | POA: Diagnosis not present

## 2019-11-08 DIAGNOSIS — D631 Anemia in chronic kidney disease: Secondary | ICD-10-CM | POA: Diagnosis not present

## 2019-11-10 DIAGNOSIS — Z992 Dependence on renal dialysis: Secondary | ICD-10-CM | POA: Diagnosis not present

## 2019-11-10 DIAGNOSIS — N186 End stage renal disease: Secondary | ICD-10-CM | POA: Diagnosis not present

## 2019-11-10 DIAGNOSIS — D509 Iron deficiency anemia, unspecified: Secondary | ICD-10-CM | POA: Diagnosis not present

## 2019-11-10 DIAGNOSIS — N2581 Secondary hyperparathyroidism of renal origin: Secondary | ICD-10-CM | POA: Diagnosis not present

## 2019-11-10 DIAGNOSIS — D631 Anemia in chronic kidney disease: Secondary | ICD-10-CM | POA: Diagnosis not present

## 2019-11-12 ENCOUNTER — Encounter (INDEPENDENT_AMBULATORY_CARE_PROVIDER_SITE_OTHER): Payer: Self-pay | Admitting: Family Medicine

## 2019-11-12 DIAGNOSIS — N2581 Secondary hyperparathyroidism of renal origin: Secondary | ICD-10-CM | POA: Diagnosis not present

## 2019-11-12 DIAGNOSIS — N186 End stage renal disease: Secondary | ICD-10-CM | POA: Diagnosis not present

## 2019-11-12 DIAGNOSIS — D509 Iron deficiency anemia, unspecified: Secondary | ICD-10-CM | POA: Diagnosis not present

## 2019-11-12 DIAGNOSIS — D631 Anemia in chronic kidney disease: Secondary | ICD-10-CM | POA: Diagnosis not present

## 2019-11-12 DIAGNOSIS — Z992 Dependence on renal dialysis: Secondary | ICD-10-CM | POA: Diagnosis not present

## 2019-11-15 DIAGNOSIS — D631 Anemia in chronic kidney disease: Secondary | ICD-10-CM | POA: Diagnosis not present

## 2019-11-15 DIAGNOSIS — N2581 Secondary hyperparathyroidism of renal origin: Secondary | ICD-10-CM | POA: Diagnosis not present

## 2019-11-15 DIAGNOSIS — D509 Iron deficiency anemia, unspecified: Secondary | ICD-10-CM | POA: Diagnosis not present

## 2019-11-15 DIAGNOSIS — Z992 Dependence on renal dialysis: Secondary | ICD-10-CM | POA: Diagnosis not present

## 2019-11-15 DIAGNOSIS — N186 End stage renal disease: Secondary | ICD-10-CM | POA: Diagnosis not present

## 2019-11-17 DIAGNOSIS — Z992 Dependence on renal dialysis: Secondary | ICD-10-CM | POA: Diagnosis not present

## 2019-11-17 DIAGNOSIS — N186 End stage renal disease: Secondary | ICD-10-CM | POA: Diagnosis not present

## 2019-11-17 DIAGNOSIS — D631 Anemia in chronic kidney disease: Secondary | ICD-10-CM | POA: Diagnosis not present

## 2019-11-17 DIAGNOSIS — D509 Iron deficiency anemia, unspecified: Secondary | ICD-10-CM | POA: Diagnosis not present

## 2019-11-17 DIAGNOSIS — N2581 Secondary hyperparathyroidism of renal origin: Secondary | ICD-10-CM | POA: Diagnosis not present

## 2019-11-19 DIAGNOSIS — Z992 Dependence on renal dialysis: Secondary | ICD-10-CM | POA: Diagnosis not present

## 2019-11-19 DIAGNOSIS — N2581 Secondary hyperparathyroidism of renal origin: Secondary | ICD-10-CM | POA: Diagnosis not present

## 2019-11-19 DIAGNOSIS — D509 Iron deficiency anemia, unspecified: Secondary | ICD-10-CM | POA: Diagnosis not present

## 2019-11-19 DIAGNOSIS — N186 End stage renal disease: Secondary | ICD-10-CM | POA: Diagnosis not present

## 2019-11-19 DIAGNOSIS — D631 Anemia in chronic kidney disease: Secondary | ICD-10-CM | POA: Diagnosis not present

## 2019-11-22 DIAGNOSIS — D631 Anemia in chronic kidney disease: Secondary | ICD-10-CM | POA: Diagnosis not present

## 2019-11-22 DIAGNOSIS — Z992 Dependence on renal dialysis: Secondary | ICD-10-CM | POA: Diagnosis not present

## 2019-11-22 DIAGNOSIS — N186 End stage renal disease: Secondary | ICD-10-CM | POA: Diagnosis not present

## 2019-11-22 DIAGNOSIS — N2581 Secondary hyperparathyroidism of renal origin: Secondary | ICD-10-CM | POA: Diagnosis not present

## 2019-11-22 DIAGNOSIS — D509 Iron deficiency anemia, unspecified: Secondary | ICD-10-CM | POA: Diagnosis not present

## 2019-11-24 DIAGNOSIS — D631 Anemia in chronic kidney disease: Secondary | ICD-10-CM | POA: Diagnosis not present

## 2019-11-24 DIAGNOSIS — N2581 Secondary hyperparathyroidism of renal origin: Secondary | ICD-10-CM | POA: Diagnosis not present

## 2019-11-24 DIAGNOSIS — Z992 Dependence on renal dialysis: Secondary | ICD-10-CM | POA: Diagnosis not present

## 2019-11-24 DIAGNOSIS — N186 End stage renal disease: Secondary | ICD-10-CM | POA: Diagnosis not present

## 2019-11-24 DIAGNOSIS — D509 Iron deficiency anemia, unspecified: Secondary | ICD-10-CM | POA: Diagnosis not present

## 2019-11-26 DIAGNOSIS — D631 Anemia in chronic kidney disease: Secondary | ICD-10-CM | POA: Diagnosis not present

## 2019-11-26 DIAGNOSIS — N186 End stage renal disease: Secondary | ICD-10-CM | POA: Diagnosis not present

## 2019-11-26 DIAGNOSIS — N2581 Secondary hyperparathyroidism of renal origin: Secondary | ICD-10-CM | POA: Diagnosis not present

## 2019-11-26 DIAGNOSIS — D509 Iron deficiency anemia, unspecified: Secondary | ICD-10-CM | POA: Diagnosis not present

## 2019-11-26 DIAGNOSIS — Z992 Dependence on renal dialysis: Secondary | ICD-10-CM | POA: Diagnosis not present

## 2019-11-28 DIAGNOSIS — N186 End stage renal disease: Secondary | ICD-10-CM | POA: Diagnosis not present

## 2019-11-28 DIAGNOSIS — I129 Hypertensive chronic kidney disease with stage 1 through stage 4 chronic kidney disease, or unspecified chronic kidney disease: Secondary | ICD-10-CM | POA: Diagnosis not present

## 2019-11-28 DIAGNOSIS — Z992 Dependence on renal dialysis: Secondary | ICD-10-CM | POA: Diagnosis not present

## 2019-11-29 DIAGNOSIS — D509 Iron deficiency anemia, unspecified: Secondary | ICD-10-CM | POA: Diagnosis not present

## 2019-11-29 DIAGNOSIS — Z992 Dependence on renal dialysis: Secondary | ICD-10-CM | POA: Diagnosis not present

## 2019-11-29 DIAGNOSIS — N2581 Secondary hyperparathyroidism of renal origin: Secondary | ICD-10-CM | POA: Diagnosis not present

## 2019-11-29 DIAGNOSIS — N186 End stage renal disease: Secondary | ICD-10-CM | POA: Diagnosis not present

## 2019-11-29 DIAGNOSIS — Z23 Encounter for immunization: Secondary | ICD-10-CM | POA: Diagnosis not present

## 2019-12-01 DIAGNOSIS — N186 End stage renal disease: Secondary | ICD-10-CM | POA: Diagnosis not present

## 2019-12-01 DIAGNOSIS — Z992 Dependence on renal dialysis: Secondary | ICD-10-CM | POA: Diagnosis not present

## 2019-12-01 DIAGNOSIS — N2581 Secondary hyperparathyroidism of renal origin: Secondary | ICD-10-CM | POA: Diagnosis not present

## 2019-12-01 DIAGNOSIS — Z23 Encounter for immunization: Secondary | ICD-10-CM | POA: Diagnosis not present

## 2019-12-01 DIAGNOSIS — D509 Iron deficiency anemia, unspecified: Secondary | ICD-10-CM | POA: Diagnosis not present

## 2019-12-03 DIAGNOSIS — D509 Iron deficiency anemia, unspecified: Secondary | ICD-10-CM | POA: Diagnosis not present

## 2019-12-03 DIAGNOSIS — N186 End stage renal disease: Secondary | ICD-10-CM | POA: Diagnosis not present

## 2019-12-03 DIAGNOSIS — N2581 Secondary hyperparathyroidism of renal origin: Secondary | ICD-10-CM | POA: Diagnosis not present

## 2019-12-03 DIAGNOSIS — Z992 Dependence on renal dialysis: Secondary | ICD-10-CM | POA: Diagnosis not present

## 2019-12-03 DIAGNOSIS — Z23 Encounter for immunization: Secondary | ICD-10-CM | POA: Diagnosis not present

## 2019-12-06 DIAGNOSIS — Z992 Dependence on renal dialysis: Secondary | ICD-10-CM | POA: Diagnosis not present

## 2019-12-06 DIAGNOSIS — Z23 Encounter for immunization: Secondary | ICD-10-CM | POA: Diagnosis not present

## 2019-12-06 DIAGNOSIS — N186 End stage renal disease: Secondary | ICD-10-CM | POA: Diagnosis not present

## 2019-12-06 DIAGNOSIS — D509 Iron deficiency anemia, unspecified: Secondary | ICD-10-CM | POA: Diagnosis not present

## 2019-12-06 DIAGNOSIS — N2581 Secondary hyperparathyroidism of renal origin: Secondary | ICD-10-CM | POA: Diagnosis not present

## 2019-12-08 DIAGNOSIS — Z20822 Contact with and (suspected) exposure to covid-19: Secondary | ICD-10-CM | POA: Diagnosis not present

## 2019-12-10 DIAGNOSIS — Z992 Dependence on renal dialysis: Secondary | ICD-10-CM | POA: Diagnosis not present

## 2019-12-10 DIAGNOSIS — N2581 Secondary hyperparathyroidism of renal origin: Secondary | ICD-10-CM | POA: Diagnosis not present

## 2019-12-10 DIAGNOSIS — N186 End stage renal disease: Secondary | ICD-10-CM | POA: Diagnosis not present

## 2019-12-10 DIAGNOSIS — D509 Iron deficiency anemia, unspecified: Secondary | ICD-10-CM | POA: Diagnosis not present

## 2019-12-10 DIAGNOSIS — Z23 Encounter for immunization: Secondary | ICD-10-CM | POA: Diagnosis not present

## 2019-12-13 DIAGNOSIS — N186 End stage renal disease: Secondary | ICD-10-CM | POA: Diagnosis not present

## 2019-12-13 DIAGNOSIS — Z992 Dependence on renal dialysis: Secondary | ICD-10-CM | POA: Diagnosis not present

## 2019-12-13 DIAGNOSIS — N2581 Secondary hyperparathyroidism of renal origin: Secondary | ICD-10-CM | POA: Diagnosis not present

## 2019-12-13 DIAGNOSIS — D509 Iron deficiency anemia, unspecified: Secondary | ICD-10-CM | POA: Diagnosis not present

## 2019-12-13 DIAGNOSIS — Z23 Encounter for immunization: Secondary | ICD-10-CM | POA: Diagnosis not present

## 2019-12-15 DIAGNOSIS — Z23 Encounter for immunization: Secondary | ICD-10-CM | POA: Diagnosis not present

## 2019-12-15 DIAGNOSIS — Z992 Dependence on renal dialysis: Secondary | ICD-10-CM | POA: Diagnosis not present

## 2019-12-15 DIAGNOSIS — N2581 Secondary hyperparathyroidism of renal origin: Secondary | ICD-10-CM | POA: Diagnosis not present

## 2019-12-15 DIAGNOSIS — D509 Iron deficiency anemia, unspecified: Secondary | ICD-10-CM | POA: Diagnosis not present

## 2019-12-15 DIAGNOSIS — N186 End stage renal disease: Secondary | ICD-10-CM | POA: Diagnosis not present

## 2019-12-16 ENCOUNTER — Encounter (INDEPENDENT_AMBULATORY_CARE_PROVIDER_SITE_OTHER): Payer: Self-pay

## 2019-12-17 DIAGNOSIS — D509 Iron deficiency anemia, unspecified: Secondary | ICD-10-CM | POA: Diagnosis not present

## 2019-12-17 DIAGNOSIS — N2581 Secondary hyperparathyroidism of renal origin: Secondary | ICD-10-CM | POA: Diagnosis not present

## 2019-12-17 DIAGNOSIS — Z23 Encounter for immunization: Secondary | ICD-10-CM | POA: Diagnosis not present

## 2019-12-17 DIAGNOSIS — N186 End stage renal disease: Secondary | ICD-10-CM | POA: Diagnosis not present

## 2019-12-17 DIAGNOSIS — Z992 Dependence on renal dialysis: Secondary | ICD-10-CM | POA: Diagnosis not present

## 2019-12-19 ENCOUNTER — Encounter (INDEPENDENT_AMBULATORY_CARE_PROVIDER_SITE_OTHER): Payer: Self-pay

## 2019-12-20 DIAGNOSIS — N186 End stage renal disease: Secondary | ICD-10-CM | POA: Diagnosis not present

## 2019-12-20 DIAGNOSIS — Z992 Dependence on renal dialysis: Secondary | ICD-10-CM | POA: Diagnosis not present

## 2019-12-20 DIAGNOSIS — Z23 Encounter for immunization: Secondary | ICD-10-CM | POA: Diagnosis not present

## 2019-12-20 DIAGNOSIS — D509 Iron deficiency anemia, unspecified: Secondary | ICD-10-CM | POA: Diagnosis not present

## 2019-12-20 DIAGNOSIS — N2581 Secondary hyperparathyroidism of renal origin: Secondary | ICD-10-CM | POA: Diagnosis not present

## 2019-12-21 ENCOUNTER — Ambulatory Visit (INDEPENDENT_AMBULATORY_CARE_PROVIDER_SITE_OTHER): Payer: Medicare Other

## 2019-12-21 DIAGNOSIS — Z23 Encounter for immunization: Secondary | ICD-10-CM

## 2019-12-22 DIAGNOSIS — D509 Iron deficiency anemia, unspecified: Secondary | ICD-10-CM | POA: Diagnosis not present

## 2019-12-22 DIAGNOSIS — N2581 Secondary hyperparathyroidism of renal origin: Secondary | ICD-10-CM | POA: Diagnosis not present

## 2019-12-22 DIAGNOSIS — Z992 Dependence on renal dialysis: Secondary | ICD-10-CM | POA: Diagnosis not present

## 2019-12-22 DIAGNOSIS — Z23 Encounter for immunization: Secondary | ICD-10-CM | POA: Diagnosis not present

## 2019-12-22 DIAGNOSIS — N186 End stage renal disease: Secondary | ICD-10-CM | POA: Diagnosis not present

## 2019-12-24 DIAGNOSIS — N186 End stage renal disease: Secondary | ICD-10-CM | POA: Diagnosis not present

## 2019-12-24 DIAGNOSIS — Z23 Encounter for immunization: Secondary | ICD-10-CM | POA: Diagnosis not present

## 2019-12-24 DIAGNOSIS — D509 Iron deficiency anemia, unspecified: Secondary | ICD-10-CM | POA: Diagnosis not present

## 2019-12-24 DIAGNOSIS — N2581 Secondary hyperparathyroidism of renal origin: Secondary | ICD-10-CM | POA: Diagnosis not present

## 2019-12-24 DIAGNOSIS — Z992 Dependence on renal dialysis: Secondary | ICD-10-CM | POA: Diagnosis not present

## 2019-12-26 DIAGNOSIS — Z992 Dependence on renal dialysis: Secondary | ICD-10-CM | POA: Diagnosis not present

## 2019-12-26 DIAGNOSIS — I129 Hypertensive chronic kidney disease with stage 1 through stage 4 chronic kidney disease, or unspecified chronic kidney disease: Secondary | ICD-10-CM | POA: Diagnosis not present

## 2019-12-26 DIAGNOSIS — N186 End stage renal disease: Secondary | ICD-10-CM | POA: Diagnosis not present

## 2019-12-27 DIAGNOSIS — Z23 Encounter for immunization: Secondary | ICD-10-CM | POA: Diagnosis not present

## 2019-12-27 DIAGNOSIS — N186 End stage renal disease: Secondary | ICD-10-CM | POA: Diagnosis not present

## 2019-12-27 DIAGNOSIS — D509 Iron deficiency anemia, unspecified: Secondary | ICD-10-CM | POA: Diagnosis not present

## 2019-12-27 DIAGNOSIS — Z992 Dependence on renal dialysis: Secondary | ICD-10-CM | POA: Diagnosis not present

## 2019-12-27 DIAGNOSIS — N2581 Secondary hyperparathyroidism of renal origin: Secondary | ICD-10-CM | POA: Diagnosis not present

## 2019-12-29 ENCOUNTER — Other Ambulatory Visit: Payer: Self-pay | Admitting: Cardiology

## 2019-12-29 DIAGNOSIS — D509 Iron deficiency anemia, unspecified: Secondary | ICD-10-CM | POA: Diagnosis not present

## 2019-12-29 DIAGNOSIS — N186 End stage renal disease: Secondary | ICD-10-CM | POA: Diagnosis not present

## 2019-12-29 DIAGNOSIS — Z992 Dependence on renal dialysis: Secondary | ICD-10-CM | POA: Diagnosis not present

## 2019-12-29 DIAGNOSIS — Z23 Encounter for immunization: Secondary | ICD-10-CM | POA: Diagnosis not present

## 2019-12-29 DIAGNOSIS — N2581 Secondary hyperparathyroidism of renal origin: Secondary | ICD-10-CM | POA: Diagnosis not present

## 2019-12-30 ENCOUNTER — Other Ambulatory Visit: Payer: Self-pay | Admitting: Cardiology

## 2019-12-30 NOTE — Telephone Encounter (Signed)
*  STAT* If patient is at the pharmacy, call can be transferred to refill team.   1. Which medications need to be refilled? (please list name of each medication and dose if known) atorvastatin (LIPITOR) 80 MG tablet  2. Which pharmacy/location (including street and city if local pharmacy) is medication to be sent to? [place], [place]  3. Do they need a 30 day or 90 day supply? [place]

## 2019-12-31 DIAGNOSIS — N2581 Secondary hyperparathyroidism of renal origin: Secondary | ICD-10-CM | POA: Diagnosis not present

## 2019-12-31 DIAGNOSIS — Z23 Encounter for immunization: Secondary | ICD-10-CM | POA: Diagnosis not present

## 2019-12-31 DIAGNOSIS — Z992 Dependence on renal dialysis: Secondary | ICD-10-CM | POA: Diagnosis not present

## 2019-12-31 DIAGNOSIS — D509 Iron deficiency anemia, unspecified: Secondary | ICD-10-CM | POA: Diagnosis not present

## 2019-12-31 DIAGNOSIS — N186 End stage renal disease: Secondary | ICD-10-CM | POA: Diagnosis not present

## 2020-01-03 DIAGNOSIS — Z23 Encounter for immunization: Secondary | ICD-10-CM | POA: Diagnosis not present

## 2020-01-03 DIAGNOSIS — N2581 Secondary hyperparathyroidism of renal origin: Secondary | ICD-10-CM | POA: Diagnosis not present

## 2020-01-03 DIAGNOSIS — D509 Iron deficiency anemia, unspecified: Secondary | ICD-10-CM | POA: Diagnosis not present

## 2020-01-03 DIAGNOSIS — Z992 Dependence on renal dialysis: Secondary | ICD-10-CM | POA: Diagnosis not present

## 2020-01-03 DIAGNOSIS — N186 End stage renal disease: Secondary | ICD-10-CM | POA: Diagnosis not present

## 2020-01-04 NOTE — Telephone Encounter (Signed)
Follow up   I left a message for the patient to setup an appt for future refills.

## 2020-01-05 DIAGNOSIS — D509 Iron deficiency anemia, unspecified: Secondary | ICD-10-CM | POA: Diagnosis not present

## 2020-01-05 DIAGNOSIS — Z23 Encounter for immunization: Secondary | ICD-10-CM | POA: Diagnosis not present

## 2020-01-05 DIAGNOSIS — Z992 Dependence on renal dialysis: Secondary | ICD-10-CM | POA: Diagnosis not present

## 2020-01-05 DIAGNOSIS — N186 End stage renal disease: Secondary | ICD-10-CM | POA: Diagnosis not present

## 2020-01-05 DIAGNOSIS — N2581 Secondary hyperparathyroidism of renal origin: Secondary | ICD-10-CM | POA: Diagnosis not present

## 2020-01-07 DIAGNOSIS — D509 Iron deficiency anemia, unspecified: Secondary | ICD-10-CM | POA: Diagnosis not present

## 2020-01-07 DIAGNOSIS — Z23 Encounter for immunization: Secondary | ICD-10-CM | POA: Diagnosis not present

## 2020-01-07 DIAGNOSIS — N186 End stage renal disease: Secondary | ICD-10-CM | POA: Diagnosis not present

## 2020-01-07 DIAGNOSIS — Z992 Dependence on renal dialysis: Secondary | ICD-10-CM | POA: Diagnosis not present

## 2020-01-07 DIAGNOSIS — N2581 Secondary hyperparathyroidism of renal origin: Secondary | ICD-10-CM | POA: Diagnosis not present

## 2020-01-10 DIAGNOSIS — Z23 Encounter for immunization: Secondary | ICD-10-CM | POA: Diagnosis not present

## 2020-01-10 DIAGNOSIS — N186 End stage renal disease: Secondary | ICD-10-CM | POA: Diagnosis not present

## 2020-01-10 DIAGNOSIS — Z992 Dependence on renal dialysis: Secondary | ICD-10-CM | POA: Diagnosis not present

## 2020-01-10 DIAGNOSIS — N2581 Secondary hyperparathyroidism of renal origin: Secondary | ICD-10-CM | POA: Diagnosis not present

## 2020-01-10 DIAGNOSIS — D509 Iron deficiency anemia, unspecified: Secondary | ICD-10-CM | POA: Diagnosis not present

## 2020-01-12 DIAGNOSIS — Z23 Encounter for immunization: Secondary | ICD-10-CM | POA: Diagnosis not present

## 2020-01-12 DIAGNOSIS — N2581 Secondary hyperparathyroidism of renal origin: Secondary | ICD-10-CM | POA: Diagnosis not present

## 2020-01-12 DIAGNOSIS — Z992 Dependence on renal dialysis: Secondary | ICD-10-CM | POA: Diagnosis not present

## 2020-01-12 DIAGNOSIS — D509 Iron deficiency anemia, unspecified: Secondary | ICD-10-CM | POA: Diagnosis not present

## 2020-01-12 DIAGNOSIS — N186 End stage renal disease: Secondary | ICD-10-CM | POA: Diagnosis not present

## 2020-01-14 DIAGNOSIS — D509 Iron deficiency anemia, unspecified: Secondary | ICD-10-CM | POA: Diagnosis not present

## 2020-01-14 DIAGNOSIS — Z23 Encounter for immunization: Secondary | ICD-10-CM | POA: Diagnosis not present

## 2020-01-14 DIAGNOSIS — N2581 Secondary hyperparathyroidism of renal origin: Secondary | ICD-10-CM | POA: Diagnosis not present

## 2020-01-14 DIAGNOSIS — Z992 Dependence on renal dialysis: Secondary | ICD-10-CM | POA: Diagnosis not present

## 2020-01-14 DIAGNOSIS — N186 End stage renal disease: Secondary | ICD-10-CM | POA: Diagnosis not present

## 2020-01-17 DIAGNOSIS — Z992 Dependence on renal dialysis: Secondary | ICD-10-CM | POA: Diagnosis not present

## 2020-01-17 DIAGNOSIS — N186 End stage renal disease: Secondary | ICD-10-CM | POA: Diagnosis not present

## 2020-01-17 DIAGNOSIS — N2581 Secondary hyperparathyroidism of renal origin: Secondary | ICD-10-CM | POA: Diagnosis not present

## 2020-01-17 DIAGNOSIS — Z23 Encounter for immunization: Secondary | ICD-10-CM | POA: Diagnosis not present

## 2020-01-17 DIAGNOSIS — D509 Iron deficiency anemia, unspecified: Secondary | ICD-10-CM | POA: Diagnosis not present

## 2020-01-19 DIAGNOSIS — N2581 Secondary hyperparathyroidism of renal origin: Secondary | ICD-10-CM | POA: Diagnosis not present

## 2020-01-19 DIAGNOSIS — Z992 Dependence on renal dialysis: Secondary | ICD-10-CM | POA: Diagnosis not present

## 2020-01-19 DIAGNOSIS — D509 Iron deficiency anemia, unspecified: Secondary | ICD-10-CM | POA: Diagnosis not present

## 2020-01-19 DIAGNOSIS — N186 End stage renal disease: Secondary | ICD-10-CM | POA: Diagnosis not present

## 2020-01-19 DIAGNOSIS — Z23 Encounter for immunization: Secondary | ICD-10-CM | POA: Diagnosis not present

## 2020-01-21 DIAGNOSIS — D509 Iron deficiency anemia, unspecified: Secondary | ICD-10-CM | POA: Diagnosis not present

## 2020-01-21 DIAGNOSIS — N186 End stage renal disease: Secondary | ICD-10-CM | POA: Diagnosis not present

## 2020-01-21 DIAGNOSIS — N2581 Secondary hyperparathyroidism of renal origin: Secondary | ICD-10-CM | POA: Diagnosis not present

## 2020-01-21 DIAGNOSIS — Z992 Dependence on renal dialysis: Secondary | ICD-10-CM | POA: Diagnosis not present

## 2020-01-21 DIAGNOSIS — Z23 Encounter for immunization: Secondary | ICD-10-CM | POA: Diagnosis not present

## 2020-01-24 DIAGNOSIS — N186 End stage renal disease: Secondary | ICD-10-CM | POA: Diagnosis not present

## 2020-01-24 DIAGNOSIS — N2581 Secondary hyperparathyroidism of renal origin: Secondary | ICD-10-CM | POA: Diagnosis not present

## 2020-01-24 DIAGNOSIS — D509 Iron deficiency anemia, unspecified: Secondary | ICD-10-CM | POA: Diagnosis not present

## 2020-01-24 DIAGNOSIS — Z992 Dependence on renal dialysis: Secondary | ICD-10-CM | POA: Diagnosis not present

## 2020-01-24 DIAGNOSIS — Z23 Encounter for immunization: Secondary | ICD-10-CM | POA: Diagnosis not present

## 2020-01-26 ENCOUNTER — Other Ambulatory Visit: Payer: Self-pay | Admitting: Cardiology

## 2020-01-26 DIAGNOSIS — I129 Hypertensive chronic kidney disease with stage 1 through stage 4 chronic kidney disease, or unspecified chronic kidney disease: Secondary | ICD-10-CM | POA: Diagnosis not present

## 2020-01-26 DIAGNOSIS — N2581 Secondary hyperparathyroidism of renal origin: Secondary | ICD-10-CM | POA: Diagnosis not present

## 2020-01-26 DIAGNOSIS — Z992 Dependence on renal dialysis: Secondary | ICD-10-CM | POA: Diagnosis not present

## 2020-01-26 DIAGNOSIS — Z23 Encounter for immunization: Secondary | ICD-10-CM | POA: Diagnosis not present

## 2020-01-26 DIAGNOSIS — N186 End stage renal disease: Secondary | ICD-10-CM | POA: Diagnosis not present

## 2020-01-26 NOTE — Telephone Encounter (Signed)
°*  STAT* If patient is at the pharmacy, call can be transferred to refill team.   1. Which medications need to be refilled? (please list name of each medication and dose if known)  isosorbide mononitrate (IMDUR) 60 MG 24 hr tablet atorvastatin (LIPITOR) 80 MG tablet   2. Which pharmacy/location (including street and city if local pharmacy) is medication to be sent to?  [place], [place]  3. Do they need a 30 day or 90 day supply? Isosorbide 90 day       atorvastatin 30 day

## 2020-01-31 DIAGNOSIS — Z23 Encounter for immunization: Secondary | ICD-10-CM | POA: Diagnosis not present

## 2020-01-31 DIAGNOSIS — N186 End stage renal disease: Secondary | ICD-10-CM | POA: Diagnosis not present

## 2020-01-31 DIAGNOSIS — Z992 Dependence on renal dialysis: Secondary | ICD-10-CM | POA: Diagnosis not present

## 2020-01-31 DIAGNOSIS — N2581 Secondary hyperparathyroidism of renal origin: Secondary | ICD-10-CM | POA: Diagnosis not present

## 2020-02-02 DIAGNOSIS — N2581 Secondary hyperparathyroidism of renal origin: Secondary | ICD-10-CM | POA: Diagnosis not present

## 2020-02-02 DIAGNOSIS — N186 End stage renal disease: Secondary | ICD-10-CM | POA: Diagnosis not present

## 2020-02-02 DIAGNOSIS — Z23 Encounter for immunization: Secondary | ICD-10-CM | POA: Diagnosis not present

## 2020-02-02 DIAGNOSIS — Z992 Dependence on renal dialysis: Secondary | ICD-10-CM | POA: Diagnosis not present

## 2020-02-04 DIAGNOSIS — Z992 Dependence on renal dialysis: Secondary | ICD-10-CM | POA: Diagnosis not present

## 2020-02-04 DIAGNOSIS — N2581 Secondary hyperparathyroidism of renal origin: Secondary | ICD-10-CM | POA: Diagnosis not present

## 2020-02-04 DIAGNOSIS — Z23 Encounter for immunization: Secondary | ICD-10-CM | POA: Diagnosis not present

## 2020-02-04 DIAGNOSIS — N186 End stage renal disease: Secondary | ICD-10-CM | POA: Diagnosis not present

## 2020-02-07 DIAGNOSIS — Z23 Encounter for immunization: Secondary | ICD-10-CM | POA: Diagnosis not present

## 2020-02-07 DIAGNOSIS — Z992 Dependence on renal dialysis: Secondary | ICD-10-CM | POA: Diagnosis not present

## 2020-02-07 DIAGNOSIS — N186 End stage renal disease: Secondary | ICD-10-CM | POA: Diagnosis not present

## 2020-02-07 DIAGNOSIS — N2581 Secondary hyperparathyroidism of renal origin: Secondary | ICD-10-CM | POA: Diagnosis not present

## 2020-02-09 DIAGNOSIS — N186 End stage renal disease: Secondary | ICD-10-CM | POA: Diagnosis not present

## 2020-02-09 DIAGNOSIS — Z23 Encounter for immunization: Secondary | ICD-10-CM | POA: Diagnosis not present

## 2020-02-09 DIAGNOSIS — N2581 Secondary hyperparathyroidism of renal origin: Secondary | ICD-10-CM | POA: Diagnosis not present

## 2020-02-09 DIAGNOSIS — Z992 Dependence on renal dialysis: Secondary | ICD-10-CM | POA: Diagnosis not present

## 2020-02-11 DIAGNOSIS — N186 End stage renal disease: Secondary | ICD-10-CM | POA: Diagnosis not present

## 2020-02-11 DIAGNOSIS — Z23 Encounter for immunization: Secondary | ICD-10-CM | POA: Diagnosis not present

## 2020-02-11 DIAGNOSIS — N2581 Secondary hyperparathyroidism of renal origin: Secondary | ICD-10-CM | POA: Diagnosis not present

## 2020-02-11 DIAGNOSIS — Z992 Dependence on renal dialysis: Secondary | ICD-10-CM | POA: Diagnosis not present

## 2020-02-14 DIAGNOSIS — Z992 Dependence on renal dialysis: Secondary | ICD-10-CM | POA: Diagnosis not present

## 2020-02-14 DIAGNOSIS — N186 End stage renal disease: Secondary | ICD-10-CM | POA: Diagnosis not present

## 2020-02-14 DIAGNOSIS — N2581 Secondary hyperparathyroidism of renal origin: Secondary | ICD-10-CM | POA: Diagnosis not present

## 2020-02-14 DIAGNOSIS — Z23 Encounter for immunization: Secondary | ICD-10-CM | POA: Diagnosis not present

## 2020-02-14 NOTE — Progress Notes (Signed)
Cardiology Office Note    Date:  02/14/2020   ID:  Kenneth [name], Kenneth [name] 24-Jun-1956, MRN [id number]  PCP:  [name], MD  Cardiologist:  Dr. [name]  No chief complaint on file.   History of Present Illness:  Kenneth [name] is a 64 y.o. male with past medical history of diastolic heart failure, heavy EtOH use, atrial fibrillation and atrial flutter not a candidate for anticoagulation due to poor compliance, ESRD on HD TTS, hypertension and history of nonsustained VT.  Last seen in January 2020. His EF has been low in 25 to 30% range in 2017.  Repeat echocardiogram in February 2019 showed normalization of LVEF of 65 to 70%.  He was admitted in May 2019 with acute hypoxic respiratory failure due to pulmonary edema from noncompliance with hemodialysis.  While in the emergency room, he suffered a PEA arrest.  He received empiric treatment for hyperkalemia and shock for wide complex tachycardia.  He was also treated with amiodarone as well.  He achieved ROSC after 10 minutes of ACLS, following this he had A. fib with RVR and ultimately reverted to sinus rhythm.  Cardiology was not consulted at the time.  Echocardiogram obtained on 03/09/2018 demonstrated severe LVH, normal EF of 55 to 60%, moderately decreased RV systolic function and a thickened aortic and mitral valve.  There was some mention in the echocardiogram for consideration of evaluation for cardiac amyloidosis including PYP scan, this never took place.  Patient was admitted in early November 2019 with chest pain.  During the hospitalization, he had a significant T wave inversion in inferior and lateral leads concerning for progression of coronary artery disease.  He ended up having cardiac catheterization on 09/02/2018 which showed severe multivessel CAD involving distal left main, ostial to proximal LAD, ostial to proximal left circumflex, mid RCA.  CT surgery was consulted.  Patient was seen by Dr. Prescott Gum on 09/03/2018, given his  comorbidities, although CABG likely will improve his cardiac issue but likely will result in further severe problems with multiorgan failure and dysfunction.  Therefore he was turned down for bypass surgery.  He was felt to be a very high risk patient for PCI and the risk was prohibitive especially in light of his compliance issue.  The procedure is also high risk as he would require unprotected left main intervention, atherectomy of LAD and bifurcation stenting.  He was eventually discharged on medical therapy.  However he returned to the hospital on 09/17/2018 with sudden onset of chest pain.  Initial EKG showed severe ST depression in inferolateral leads.  A code STEMI was called however subsequently canceled.  He was admitted for observation, cardiac enzyme was negative.  He was continue on aspirin, Plavix and statin.  On follow up today he is doing well from a cardiac standpoint. Denies any chest pain or SOB. Still getting dialysis 3x/week. Weight stable per his report. Some low BP at times on dialysis. Patient complains today of back pain and wants pain medication.      Past Medical History:  Diagnosis Date   Acute CHF ([place]) 01/2018   Cardiomyopathy secondary    likely related to HTN heart disease; possibly ETOH related as well   Chronic combined systolic and diastolic heart failure ([place])    Echocardiogram 09/22/11: Moderate LVH, EF 29-79%, grade 3 diastolic dysfunction, mild MR, moderate to severe LAE, mild RVE, mild to moderate TR, small to moderate pericardial effusion   Coronary artery disease  not felt to be candidate for CABG 08/2018; medical thearpy recomended due to high risk of PCI    Dysrhythmia    aflutter 04/2016, afib 02/2018, not felt to be a candidate for anticoagulation due to non-compliance and ETOH   ESRD (end stage renal disease) on dialysis [facility])    due to hypertensive nephrosclerosis; TTS; Richarda Blade. (09/01/2018)   History of alcohol abuse    Hypertension     Myocardial infarction [facility])    " mild " per daughter   PEA (Pulseless electrical activity) ([place])    PEA arrest 03/09/18, treated empirically for hyperkalemia, shock x1 for WCT, given amiodarone, ROSC after 10 min of ACLS    Past Surgical History:  Procedure Laterality Date   AV FISTULA PLACEMENT Left 05/14/2016   Procedure: LEFT ARM BASILIC VEIN TRANSPOSITION;  Surgeon: Rosetta Posner, MD;  Location: [place];  Service: Vascular;  Laterality: Left;   INSERTION OF DIALYSIS CATHETER N/A 06/20/2019   Procedure: INSERTION OF TUNNELED  DIALYSIS CATHETER;  Surgeon: Elam Dutch, MD;  Location: [place];  Service: Vascular;  Laterality: N/A;   LEFT HEART CATH AND CORONARY ANGIOGRAPHY N/A 09/02/2018   Procedure: LEFT HEART CATH AND CORONARY ANGIOGRAPHY;  Surgeon: Nelva Bush, MD;  Location: [place] CV LAB;  Service: Cardiovascular;  Laterality: N/A;   PERIPHERAL VASCULAR CATHETERIZATION N/A 05/13/2016   Procedure: Dialysis/Perma Catheter Insertion;  Surgeon: Serafina Mitchell, MD;  Location: [place] CV LAB;  Service: Cardiovascular;  Laterality: N/A;   REVISION OF ARTERIOVENOUS GORETEX GRAFT Left 3/50/0938   Procedure: PLICATION OF ARTERIOVENOUS FISTULA LEFT ARM;  Surgeon: Elam Dutch, MD;  Location: [facility];  Service: Vascular;  Laterality: Left;    Current Medications: Outpatient Medications Prior to Visit  Medication Sig Dispense Refill   aspirin EC 81 MG tablet Take 81 mg by mouth every evening.     atorvastatin (LIPITOR) 80 MG tablet TAKE 1 TABLET BY MOUTH ONCE DAILY AT 6 PMMAKE APPOINTMENT FOR FUTURE REFILLS 30 tablet 0   clopidogrel (PLAVIX) 75 MG tablet Take 75 mg by mouth every evening.     isosorbide mononitrate (IMDUR) 60 MG 24 hr tablet Take 1 tablet by mouth in the evening 90 tablet 0   nitroGLYCERIN (NITROSTAT) 0.4 MG SL tablet Place 0.4 mg under the tongue every 5 (five) minutes x 3 doses as needed for chest pain.     oxyCODONE (OXY IR/ROXICODONE) 5 MG immediate  release tablet Take 1 tablet (5 mg total) by mouth every 6 (six) hours as needed for moderate pain. 12 tablet 0   No facility-administered medications prior to visit.     Allergies:   Patient has no known allergies.   Social History   Socioeconomic History   Marital status: Divorced    Spouse name: Not on file   Number of children: Not on file   Years of education: Not on file   Highest education level: Not on file  Occupational History   Not on file  Tobacco Use   Smoking status: Former Smoker    Packs/day: 2.00    Years: 48.00    Pack years: 96.00    Types: Cigars    Quit date: 10/27/2017    Years since quitting: 2.3   Smokeless tobacco: Former Systems developer    Types: Chew  Substance and Sexual Activity   Alcohol use: Not Currently   Drug use: No   Sexual activity: Not Currently  Other Topics Concern   Not on file  Social History Narrative   Not on file   Social Determinants of Health   Financial Resource Strain:    Difficulty of Paying Living Expenses:   Food Insecurity:    Worried About Charity fundraiser in the Last Year:    Arboriculturist in the Last Year:   Transportation Needs:    Film/video editor (Medical):    Lack of Transportation (Non-Medical):   Physical Activity:    Days of Exercise per Week:    Minutes of Exercise per Session:   Stress:    Feeling of Stress :   Social Connections:    Frequency of Communication with Friends and Family:    Frequency of Social Gatherings with Friends and Family:    Attends Religious Services:    Active Member of Clubs or Organizations:    Attends Archivist Meetings:    Marital Status:      Family History:  The patient's family history includes Cirrhosis in his father; Emphysema in his mother.   ROS:   Please see the history of present illness.    ROS All other systems reviewed and are negative.   PHYSICAL EXAM:   VS:  There were no vitals taken for this visit.   GEN:  Well nourished, well developed, in no acute distress  HEENT: normal  Neck: no JVD, carotid bruits, or masses Cardiac: RRR; no murmurs, rubs, or gallops,no edema  Respiratory:  clear to auscultation bilaterally, normal work of breathing GI: soft, nontender, nondistended, + BS MS: no deformity or atrophy  Skin: warm and dry, no rash Neuro:  Alert and Oriented x 3, Strength and sensation are intact Psych: euthymic mood, full affect  Wt Readings from Last 3 Encounters:  10/10/19 200 lb (90.7 kg)  07/29/19 197 lb (89.4 kg)  06/20/19 202 lb 13.2 oz (92 kg)      Studies/Labs Reviewed:   EKG:  EKG is  ordered today.  NSR rate 76. T wave inversion in the inferolateral leads. Unchanged from prior. I have personally reviewed and interpreted this study.   Recent Labs: 06/20/2019: Creatinine, Ser 11.85; Hemoglobin 11.4; Platelets 169; Potassium 3.9; Sodium 137   Lipid Panel    Component Value Date/Time   CHOL 193 11/19/2018 0959   TRIG 120 11/19/2018 0959   HDL 50 11/19/2018 0959   CHOLHDL 3.9 11/19/2018 0959   CHOLHDL 4.3 09/17/2018 0604   VLDL 37 09/17/2018 0604   LDLCALC 119 (H) 11/19/2018 0959    Additional studies/ records that were reviewed today include:   Cath 09/02/2018 Conclusions: 1. Severe multivessel coronary artery disease involving the distal LMCA, ostial/proximal LAD, ostial and proximal LCx, and mid RCA. 2. Normal left ventricular filling pressure. 3. Scattered atherosclerotic calcification of the ascending aorta.  Recommendations: 1. Cardiac surgery consultation for CABG.  CT chest may be helpful to further assess degree of aortic calcification. 2. Initiate heparin infusion 8 hours after sheath removal. 3. Aggressive secondary prevention, including indefinite aspirin 81 mg daily and high-intensity statin therapy. 4. Plan for hemodialysis tomorrow.   Echo 09/18/2018 LV EF: 55% -   60% Study Conclusions  - Left ventricle: The cavity size was normal. Wall  thickness was   increased in a pattern of mild LVH. Systolic function was normal.   The estimated ejection fraction was in the range of 55% to 60%.   Doppler parameters are consistent with abnormal left ventricular   relaxation (grade 1 diastolic dysfunction). The E/e&' ratio is <8,  suggesting normal LV filling pressure. - Aortic valve: Poorly visualized. Mildly calcified leaflets. There   was no stenosis. - Mitral valve: Calcified annulus. Mildly thickened leaflets .   There was trivial regurgitation. - Left atrium: The atrium was mildly dilated.  Impressions:  - Compared to a prior study on 09/01/2018, there are no significant   changes.    ASSESSMENT:    No diagnosis found.   PLAN:  In order of problems listed above:  1.   CAD: Severe multivessel disease noted on last cardiac catheterization in November 2019. No options for revascularization. He is being treated medically.  He is having little angina.  Continue Imdur 60 mg daily and Toprol-XL.  On ASA and Plavix.   2.   Chronic diastolic heart failure: Appears to be euvolemic on physical exam. Volume status managed by dialysis  3.   End-stage renal disease on hemodialysis: Blood pressure stable  4. History of nonsustained VT: No recent recurrence of dizziness.  Despite three-vessel disease, his recent echocardiogram showed a normal ejection fraction.    Medication Adjustments/Labs and Tests Ordered: Current medicines are reviewed at length with the patient today.  Concerns regarding medicines are outlined above.  Medication changes, Labs and Tests ordered today are listed in the Patient Instructions below. There are no Patient Instructions on file for this visit.   Follow up in one year.  Signed, Benisha Hadaway [name], MD  02/14/2020 5:33 PM    [address], [place], [place]  [address] Phone: [phone]; Fax: [phone]

## 2020-02-16 DIAGNOSIS — Z992 Dependence on renal dialysis: Secondary | ICD-10-CM | POA: Diagnosis not present

## 2020-02-16 DIAGNOSIS — Z23 Encounter for immunization: Secondary | ICD-10-CM | POA: Diagnosis not present

## 2020-02-16 DIAGNOSIS — N186 End stage renal disease: Secondary | ICD-10-CM | POA: Diagnosis not present

## 2020-02-16 DIAGNOSIS — N2581 Secondary hyperparathyroidism of renal origin: Secondary | ICD-10-CM | POA: Diagnosis not present

## 2020-02-18 DIAGNOSIS — N186 End stage renal disease: Secondary | ICD-10-CM | POA: Diagnosis not present

## 2020-02-18 DIAGNOSIS — Z992 Dependence on renal dialysis: Secondary | ICD-10-CM | POA: Diagnosis not present

## 2020-02-18 DIAGNOSIS — N2581 Secondary hyperparathyroidism of renal origin: Secondary | ICD-10-CM | POA: Diagnosis not present

## 2020-02-18 DIAGNOSIS — Z23 Encounter for immunization: Secondary | ICD-10-CM | POA: Diagnosis not present

## 2020-02-21 DIAGNOSIS — N186 End stage renal disease: Secondary | ICD-10-CM | POA: Diagnosis not present

## 2020-02-21 DIAGNOSIS — N2581 Secondary hyperparathyroidism of renal origin: Secondary | ICD-10-CM | POA: Diagnosis not present

## 2020-02-21 DIAGNOSIS — Z23 Encounter for immunization: Secondary | ICD-10-CM | POA: Diagnosis not present

## 2020-02-21 DIAGNOSIS — Z992 Dependence on renal dialysis: Secondary | ICD-10-CM | POA: Diagnosis not present

## 2020-02-22 ENCOUNTER — Encounter: Payer: Self-pay | Admitting: Cardiology

## 2020-02-22 ENCOUNTER — Other Ambulatory Visit: Payer: Self-pay

## 2020-02-22 ENCOUNTER — Ambulatory Visit (INDEPENDENT_AMBULATORY_CARE_PROVIDER_SITE_OTHER): Payer: Medicare Other | Admitting: Cardiology

## 2020-02-22 VITALS — BP 140/78 | HR 76 | Ht 71.0 in | Wt 207.2 lb

## 2020-02-22 DIAGNOSIS — I472 Ventricular tachycardia: Secondary | ICD-10-CM

## 2020-02-22 DIAGNOSIS — I4729 Other ventricular tachycardia: Secondary | ICD-10-CM

## 2020-02-22 DIAGNOSIS — N186 End stage renal disease: Secondary | ICD-10-CM

## 2020-02-22 DIAGNOSIS — I25119 Atherosclerotic heart disease of native coronary artery with unspecified angina pectoris: Secondary | ICD-10-CM

## 2020-02-22 DIAGNOSIS — I5032 Chronic diastolic (congestive) heart failure: Secondary | ICD-10-CM | POA: Diagnosis not present

## 2020-02-23 DIAGNOSIS — Z23 Encounter for immunization: Secondary | ICD-10-CM | POA: Diagnosis not present

## 2020-02-23 DIAGNOSIS — Z992 Dependence on renal dialysis: Secondary | ICD-10-CM | POA: Diagnosis not present

## 2020-02-23 DIAGNOSIS — N2581 Secondary hyperparathyroidism of renal origin: Secondary | ICD-10-CM | POA: Diagnosis not present

## 2020-02-23 DIAGNOSIS — N186 End stage renal disease: Secondary | ICD-10-CM | POA: Diagnosis not present

## 2020-02-24 ENCOUNTER — Encounter: Payer: Self-pay | Admitting: Family Medicine

## 2020-02-24 ENCOUNTER — Other Ambulatory Visit: Payer: Self-pay

## 2020-02-24 ENCOUNTER — Ambulatory Visit (INDEPENDENT_AMBULATORY_CARE_PROVIDER_SITE_OTHER): Payer: Medicare Other | Admitting: Family Medicine

## 2020-02-24 VITALS — BP 130/64 | Temp 97.9°F | Wt 203.6 lb

## 2020-02-24 DIAGNOSIS — I25119 Atherosclerotic heart disease of native coronary artery with unspecified angina pectoris: Secondary | ICD-10-CM

## 2020-02-24 DIAGNOSIS — S29019A Strain of muscle and tendon of unspecified wall of thorax, initial encounter: Secondary | ICD-10-CM | POA: Diagnosis not present

## 2020-02-24 MED ORDER — METHYLPREDNISOLONE 4 MG PO TBPK: ORAL_TABLET | ORAL | 0 refills | Status: DC

## 2020-02-24 MED ORDER — CYCLOBENZAPRINE HCL 10 MG PO TABS: 10.0000 mg | ORAL_TABLET | Freq: Three times a day (TID) | ORAL | 0 refills | Status: DC | PRN

## 2020-02-24 NOTE — Progress Notes (Signed)
   Subjective:    Patient ID: [name], male    DOB: 1956/07/30, 64 y.o.   MRN: [id number]  HPI Here for 3 weeks of a sharp pain in the right upper back just below the shoulder blade. No recent trauma, he just woke up with the pain one morning. It hurts to deep breathe but he is not SOB. Using Tylenol and Aleve.    Review of Systems  Constitutional: Negative.   Respiratory: Negative.   Cardiovascular: Negative.   Musculoskeletal: Positive for back pain.       Objective:   Physical Exam Constitutional:      Appearance: Normal appearance.  Cardiovascular:     Rate and Rhythm: Normal rate and regular rhythm.     Pulses: Normal pulses.     Heart sounds: Normal heart sounds.  Pulmonary:     Effort: Pulmonary effort is normal.     Breath sounds: Normal breath sounds.  Musculoskeletal:     Comments: He is tender in the right upper back just inferior to the scapula. The spine is not tender and has full ROM   Neurological:     Mental Status: He is alert.           Assessment & Plan:  Thoracic strain, treat with ice packs, Flexeril, and a Medrol dose pack. Recheck prn.  Alysia Penna, MD

## 2020-02-25 DIAGNOSIS — D631 Anemia in chronic kidney disease: Secondary | ICD-10-CM | POA: Diagnosis not present

## 2020-02-25 DIAGNOSIS — N186 End stage renal disease: Secondary | ICD-10-CM | POA: Diagnosis not present

## 2020-02-25 DIAGNOSIS — N2581 Secondary hyperparathyroidism of renal origin: Secondary | ICD-10-CM | POA: Diagnosis not present

## 2020-02-25 DIAGNOSIS — I129 Hypertensive chronic kidney disease with stage 1 through stage 4 chronic kidney disease, or unspecified chronic kidney disease: Secondary | ICD-10-CM | POA: Diagnosis not present

## 2020-02-25 DIAGNOSIS — Z23 Encounter for immunization: Secondary | ICD-10-CM | POA: Diagnosis not present

## 2020-02-25 DIAGNOSIS — Z992 Dependence on renal dialysis: Secondary | ICD-10-CM | POA: Diagnosis not present

## 2020-02-27 ENCOUNTER — Ambulatory Visit: Payer: Medicare Other | Admitting: Family Medicine

## 2020-02-28 DIAGNOSIS — N2581 Secondary hyperparathyroidism of renal origin: Secondary | ICD-10-CM | POA: Diagnosis not present

## 2020-02-28 DIAGNOSIS — Z992 Dependence on renal dialysis: Secondary | ICD-10-CM | POA: Diagnosis not present

## 2020-02-28 DIAGNOSIS — N186 End stage renal disease: Secondary | ICD-10-CM | POA: Diagnosis not present

## 2020-02-28 DIAGNOSIS — Z23 Encounter for immunization: Secondary | ICD-10-CM | POA: Diagnosis not present

## 2020-02-28 DIAGNOSIS — D631 Anemia in chronic kidney disease: Secondary | ICD-10-CM | POA: Diagnosis not present

## 2020-03-01 DIAGNOSIS — Z23 Encounter for immunization: Secondary | ICD-10-CM | POA: Diagnosis not present

## 2020-03-01 DIAGNOSIS — Z992 Dependence on renal dialysis: Secondary | ICD-10-CM | POA: Diagnosis not present

## 2020-03-01 DIAGNOSIS — N186 End stage renal disease: Secondary | ICD-10-CM | POA: Diagnosis not present

## 2020-03-01 DIAGNOSIS — D631 Anemia in chronic kidney disease: Secondary | ICD-10-CM | POA: Diagnosis not present

## 2020-03-01 DIAGNOSIS — N2581 Secondary hyperparathyroidism of renal origin: Secondary | ICD-10-CM | POA: Diagnosis not present

## 2020-03-03 DIAGNOSIS — D631 Anemia in chronic kidney disease: Secondary | ICD-10-CM | POA: Diagnosis not present

## 2020-03-03 DIAGNOSIS — N2581 Secondary hyperparathyroidism of renal origin: Secondary | ICD-10-CM | POA: Diagnosis not present

## 2020-03-03 DIAGNOSIS — Z992 Dependence on renal dialysis: Secondary | ICD-10-CM | POA: Diagnosis not present

## 2020-03-03 DIAGNOSIS — Z23 Encounter for immunization: Secondary | ICD-10-CM | POA: Diagnosis not present

## 2020-03-03 DIAGNOSIS — N186 End stage renal disease: Secondary | ICD-10-CM | POA: Diagnosis not present

## 2020-03-06 DIAGNOSIS — D631 Anemia in chronic kidney disease: Secondary | ICD-10-CM | POA: Diagnosis not present

## 2020-03-06 DIAGNOSIS — Z992 Dependence on renal dialysis: Secondary | ICD-10-CM | POA: Diagnosis not present

## 2020-03-06 DIAGNOSIS — N186 End stage renal disease: Secondary | ICD-10-CM | POA: Diagnosis not present

## 2020-03-06 DIAGNOSIS — Z23 Encounter for immunization: Secondary | ICD-10-CM | POA: Diagnosis not present

## 2020-03-06 DIAGNOSIS — N2581 Secondary hyperparathyroidism of renal origin: Secondary | ICD-10-CM | POA: Diagnosis not present

## 2020-03-08 DIAGNOSIS — D631 Anemia in chronic kidney disease: Secondary | ICD-10-CM | POA: Diagnosis not present

## 2020-03-08 DIAGNOSIS — N2581 Secondary hyperparathyroidism of renal origin: Secondary | ICD-10-CM | POA: Diagnosis not present

## 2020-03-08 DIAGNOSIS — Z992 Dependence on renal dialysis: Secondary | ICD-10-CM | POA: Diagnosis not present

## 2020-03-08 DIAGNOSIS — N186 End stage renal disease: Secondary | ICD-10-CM | POA: Diagnosis not present

## 2020-03-08 DIAGNOSIS — Z23 Encounter for immunization: Secondary | ICD-10-CM | POA: Diagnosis not present

## 2020-03-10 DIAGNOSIS — Z23 Encounter for immunization: Secondary | ICD-10-CM | POA: Diagnosis not present

## 2020-03-10 DIAGNOSIS — N2581 Secondary hyperparathyroidism of renal origin: Secondary | ICD-10-CM | POA: Diagnosis not present

## 2020-03-10 DIAGNOSIS — D631 Anemia in chronic kidney disease: Secondary | ICD-10-CM | POA: Diagnosis not present

## 2020-03-10 DIAGNOSIS — N186 End stage renal disease: Secondary | ICD-10-CM | POA: Diagnosis not present

## 2020-03-10 DIAGNOSIS — Z992 Dependence on renal dialysis: Secondary | ICD-10-CM | POA: Diagnosis not present

## 2020-03-13 DIAGNOSIS — N2581 Secondary hyperparathyroidism of renal origin: Secondary | ICD-10-CM | POA: Diagnosis not present

## 2020-03-13 DIAGNOSIS — D631 Anemia in chronic kidney disease: Secondary | ICD-10-CM | POA: Diagnosis not present

## 2020-03-13 DIAGNOSIS — Z23 Encounter for immunization: Secondary | ICD-10-CM | POA: Diagnosis not present

## 2020-03-13 DIAGNOSIS — N186 End stage renal disease: Secondary | ICD-10-CM | POA: Diagnosis not present

## 2020-03-13 DIAGNOSIS — Z992 Dependence on renal dialysis: Secondary | ICD-10-CM | POA: Diagnosis not present

## 2020-03-15 DIAGNOSIS — N2581 Secondary hyperparathyroidism of renal origin: Secondary | ICD-10-CM | POA: Diagnosis not present

## 2020-03-15 DIAGNOSIS — N186 End stage renal disease: Secondary | ICD-10-CM | POA: Diagnosis not present

## 2020-03-15 DIAGNOSIS — Z992 Dependence on renal dialysis: Secondary | ICD-10-CM | POA: Diagnosis not present

## 2020-03-15 DIAGNOSIS — D631 Anemia in chronic kidney disease: Secondary | ICD-10-CM | POA: Diagnosis not present

## 2020-03-15 DIAGNOSIS — Z23 Encounter for immunization: Secondary | ICD-10-CM | POA: Diagnosis not present

## 2020-03-16 ENCOUNTER — Other Ambulatory Visit: Payer: Self-pay | Admitting: Cardiology

## 2020-03-16 NOTE — Telephone Encounter (Signed)
  *  STAT* If patient is at the pharmacy, call can be transferred to refill team.   1. Which medications need to be refilled? (please list name of each medication and dose if known)   atorvastatin (LIPITOR) 80 MG tablet    2. Which pharmacy/location (including street and city if local pharmacy) is medication to be sent to? [facility], [place]  3. Do they need a 30 day or 90 day supply? 90 days

## 2020-03-17 DIAGNOSIS — D631 Anemia in chronic kidney disease: Secondary | ICD-10-CM | POA: Diagnosis not present

## 2020-03-17 DIAGNOSIS — N2581 Secondary hyperparathyroidism of renal origin: Secondary | ICD-10-CM | POA: Diagnosis not present

## 2020-03-17 DIAGNOSIS — Z23 Encounter for immunization: Secondary | ICD-10-CM | POA: Diagnosis not present

## 2020-03-17 DIAGNOSIS — N186 End stage renal disease: Secondary | ICD-10-CM | POA: Diagnosis not present

## 2020-03-17 DIAGNOSIS — Z992 Dependence on renal dialysis: Secondary | ICD-10-CM | POA: Diagnosis not present

## 2020-03-20 DIAGNOSIS — Z992 Dependence on renal dialysis: Secondary | ICD-10-CM | POA: Diagnosis not present

## 2020-03-20 DIAGNOSIS — N2581 Secondary hyperparathyroidism of renal origin: Secondary | ICD-10-CM | POA: Diagnosis not present

## 2020-03-20 DIAGNOSIS — N186 End stage renal disease: Secondary | ICD-10-CM | POA: Diagnosis not present

## 2020-03-20 DIAGNOSIS — D631 Anemia in chronic kidney disease: Secondary | ICD-10-CM | POA: Diagnosis not present

## 2020-03-20 DIAGNOSIS — Z23 Encounter for immunization: Secondary | ICD-10-CM | POA: Diagnosis not present

## 2020-03-22 DIAGNOSIS — N186 End stage renal disease: Secondary | ICD-10-CM | POA: Diagnosis not present

## 2020-03-22 DIAGNOSIS — D631 Anemia in chronic kidney disease: Secondary | ICD-10-CM | POA: Diagnosis not present

## 2020-03-22 DIAGNOSIS — Z992 Dependence on renal dialysis: Secondary | ICD-10-CM | POA: Diagnosis not present

## 2020-03-22 DIAGNOSIS — N2581 Secondary hyperparathyroidism of renal origin: Secondary | ICD-10-CM | POA: Diagnosis not present

## 2020-03-22 DIAGNOSIS — Z23 Encounter for immunization: Secondary | ICD-10-CM | POA: Diagnosis not present

## 2020-03-24 DIAGNOSIS — N186 End stage renal disease: Secondary | ICD-10-CM | POA: Diagnosis not present

## 2020-03-24 DIAGNOSIS — N2581 Secondary hyperparathyroidism of renal origin: Secondary | ICD-10-CM | POA: Diagnosis not present

## 2020-03-24 DIAGNOSIS — Z23 Encounter for immunization: Secondary | ICD-10-CM | POA: Diagnosis not present

## 2020-03-24 DIAGNOSIS — Z992 Dependence on renal dialysis: Secondary | ICD-10-CM | POA: Diagnosis not present

## 2020-03-24 DIAGNOSIS — D631 Anemia in chronic kidney disease: Secondary | ICD-10-CM | POA: Diagnosis not present

## 2020-04-26 DIAGNOSIS — N186 End stage renal disease: Secondary | ICD-10-CM | POA: Diagnosis not present

## 2020-04-26 DIAGNOSIS — D509 Iron deficiency anemia, unspecified: Secondary | ICD-10-CM | POA: Diagnosis not present

## 2020-04-26 DIAGNOSIS — N2581 Secondary hyperparathyroidism of renal origin: Secondary | ICD-10-CM | POA: Diagnosis not present

## 2020-04-26 DIAGNOSIS — I129 Hypertensive chronic kidney disease with stage 1 through stage 4 chronic kidney disease, or unspecified chronic kidney disease: Secondary | ICD-10-CM | POA: Diagnosis not present

## 2020-04-26 DIAGNOSIS — Z992 Dependence on renal dialysis: Secondary | ICD-10-CM | POA: Diagnosis not present

## 2020-04-28 DIAGNOSIS — D509 Iron deficiency anemia, unspecified: Secondary | ICD-10-CM | POA: Diagnosis not present

## 2020-04-28 DIAGNOSIS — N186 End stage renal disease: Secondary | ICD-10-CM | POA: Diagnosis not present

## 2020-04-28 DIAGNOSIS — N2581 Secondary hyperparathyroidism of renal origin: Secondary | ICD-10-CM | POA: Diagnosis not present

## 2020-04-28 DIAGNOSIS — Z992 Dependence on renal dialysis: Secondary | ICD-10-CM | POA: Diagnosis not present

## 2020-05-01 DIAGNOSIS — D509 Iron deficiency anemia, unspecified: Secondary | ICD-10-CM | POA: Diagnosis not present

## 2020-05-01 DIAGNOSIS — N186 End stage renal disease: Secondary | ICD-10-CM | POA: Diagnosis not present

## 2020-05-01 DIAGNOSIS — Z992 Dependence on renal dialysis: Secondary | ICD-10-CM | POA: Diagnosis not present

## 2020-05-01 DIAGNOSIS — N2581 Secondary hyperparathyroidism of renal origin: Secondary | ICD-10-CM | POA: Diagnosis not present

## 2020-05-02 DIAGNOSIS — R519 Headache, unspecified: Secondary | ICD-10-CM | POA: Insufficient documentation

## 2020-05-03 DIAGNOSIS — Z992 Dependence on renal dialysis: Secondary | ICD-10-CM | POA: Diagnosis not present

## 2020-05-03 DIAGNOSIS — D509 Iron deficiency anemia, unspecified: Secondary | ICD-10-CM | POA: Diagnosis not present

## 2020-05-03 DIAGNOSIS — N186 End stage renal disease: Secondary | ICD-10-CM | POA: Diagnosis not present

## 2020-05-03 DIAGNOSIS — N2581 Secondary hyperparathyroidism of renal origin: Secondary | ICD-10-CM | POA: Diagnosis not present

## 2020-05-05 DIAGNOSIS — N186 End stage renal disease: Secondary | ICD-10-CM | POA: Diagnosis not present

## 2020-05-05 DIAGNOSIS — N2581 Secondary hyperparathyroidism of renal origin: Secondary | ICD-10-CM | POA: Diagnosis not present

## 2020-05-05 DIAGNOSIS — Z992 Dependence on renal dialysis: Secondary | ICD-10-CM | POA: Diagnosis not present

## 2020-05-05 DIAGNOSIS — D509 Iron deficiency anemia, unspecified: Secondary | ICD-10-CM | POA: Diagnosis not present

## 2020-05-08 DIAGNOSIS — N2581 Secondary hyperparathyroidism of renal origin: Secondary | ICD-10-CM | POA: Diagnosis not present

## 2020-05-08 DIAGNOSIS — N186 End stage renal disease: Secondary | ICD-10-CM | POA: Diagnosis not present

## 2020-05-08 DIAGNOSIS — Z992 Dependence on renal dialysis: Secondary | ICD-10-CM | POA: Diagnosis not present

## 2020-05-08 DIAGNOSIS — D509 Iron deficiency anemia, unspecified: Secondary | ICD-10-CM | POA: Diagnosis not present

## 2020-05-12 ENCOUNTER — Inpatient Hospital Stay (HOSPITAL_COMMUNITY)
Admission: EM | Admit: 2020-05-12 | Discharge: 2020-05-16 | DRG: 280 | Disposition: A | Discharge status: Home / Self Care | Payer: Medicare Other | Attending: Internal Medicine | Admitting: Internal Medicine

## 2020-05-12 ENCOUNTER — Emergency Department (HOSPITAL_COMMUNITY): Payer: Medicare Other

## 2020-05-12 ENCOUNTER — Other Ambulatory Visit: Payer: Self-pay

## 2020-05-12 ENCOUNTER — Encounter (HOSPITAL_COMMUNITY): Payer: Self-pay

## 2020-05-12 ENCOUNTER — Encounter (HOSPITAL_COMMUNITY)
Admission: EM | Disposition: A | Discharge status: Home / Self Care | Payer: Self-pay | Source: Home / Self Care | Attending: Internal Medicine

## 2020-05-12 DIAGNOSIS — Z20822 Contact with and (suspected) exposure to covid-19: Secondary | ICD-10-CM | POA: Diagnosis present

## 2020-05-12 DIAGNOSIS — Z7902 Long term (current) use of antithrombotics/antiplatelets: Secondary | ICD-10-CM | POA: Diagnosis not present

## 2020-05-12 DIAGNOSIS — I472 Ventricular tachycardia: Secondary | ICD-10-CM | POA: Diagnosis not present

## 2020-05-12 DIAGNOSIS — I25119 Atherosclerotic heart disease of native coronary artery with unspecified angina pectoris: Secondary | ICD-10-CM | POA: Diagnosis not present

## 2020-05-12 DIAGNOSIS — I959 Hypotension, unspecified: Secondary | ICD-10-CM

## 2020-05-12 DIAGNOSIS — E876 Hypokalemia: Secondary | ICD-10-CM | POA: Diagnosis present

## 2020-05-12 DIAGNOSIS — D631 Anemia in chronic kidney disease: Secondary | ICD-10-CM | POA: Diagnosis present

## 2020-05-12 DIAGNOSIS — I25110 Atherosclerotic heart disease of native coronary artery with unstable angina pectoris: Secondary | ICD-10-CM

## 2020-05-12 DIAGNOSIS — I953 Hypotension of hemodialysis: Secondary | ICD-10-CM | POA: Diagnosis present

## 2020-05-12 DIAGNOSIS — I4510 Unspecified right bundle-branch block: Secondary | ICD-10-CM | POA: Diagnosis present

## 2020-05-12 DIAGNOSIS — F1020 Alcohol dependence, uncomplicated: Secondary | ICD-10-CM | POA: Diagnosis present

## 2020-05-12 DIAGNOSIS — I10 Essential (primary) hypertension: Secondary | ICD-10-CM | POA: Diagnosis not present

## 2020-05-12 DIAGNOSIS — I214 Non-ST elevation (NSTEMI) myocardial infarction: Principal | ICD-10-CM

## 2020-05-12 DIAGNOSIS — I4819 Other persistent atrial fibrillation: Secondary | ICD-10-CM | POA: Diagnosis not present

## 2020-05-12 DIAGNOSIS — Z791 Long term (current) use of non-steroidal anti-inflammatories (NSAID): Secondary | ICD-10-CM

## 2020-05-12 DIAGNOSIS — I4891 Unspecified atrial fibrillation: Secondary | ICD-10-CM | POA: Diagnosis not present

## 2020-05-12 DIAGNOSIS — R9431 Abnormal electrocardiogram [ECG] [EKG]: Secondary | ICD-10-CM

## 2020-05-12 DIAGNOSIS — R404 Transient alteration of awareness: Secondary | ICD-10-CM | POA: Diagnosis not present

## 2020-05-12 DIAGNOSIS — Z825 Family history of asthma and other chronic lower respiratory diseases: Secondary | ICD-10-CM

## 2020-05-12 DIAGNOSIS — I213 ST elevation (STEMI) myocardial infarction of unspecified site: Secondary | ICD-10-CM

## 2020-05-12 DIAGNOSIS — Z992 Dependence on renal dialysis: Secondary | ICD-10-CM | POA: Diagnosis not present

## 2020-05-12 DIAGNOSIS — I132 Hypertensive heart and chronic kidney disease with heart failure and with stage 5 chronic kidney disease, or end stage renal disease: Secondary | ICD-10-CM | POA: Diagnosis not present

## 2020-05-12 DIAGNOSIS — I517 Cardiomegaly: Secondary | ICD-10-CM | POA: Diagnosis not present

## 2020-05-12 DIAGNOSIS — E8889 Other specified metabolic disorders: Secondary | ICD-10-CM | POA: Diagnosis present

## 2020-05-12 DIAGNOSIS — I361 Nonrheumatic tricuspid (valve) insufficiency: Secondary | ICD-10-CM | POA: Diagnosis not present

## 2020-05-12 DIAGNOSIS — N2581 Secondary hyperparathyroidism of renal origin: Secondary | ICD-10-CM | POA: Diagnosis not present

## 2020-05-12 DIAGNOSIS — I4892 Unspecified atrial flutter: Secondary | ICD-10-CM | POA: Diagnosis not present

## 2020-05-12 DIAGNOSIS — I340 Nonrheumatic mitral (valve) insufficiency: Secondary | ICD-10-CM | POA: Diagnosis not present

## 2020-05-12 DIAGNOSIS — Z79899 Other long term (current) drug therapy: Secondary | ICD-10-CM

## 2020-05-12 DIAGNOSIS — D509 Iron deficiency anemia, unspecified: Secondary | ICD-10-CM | POA: Diagnosis not present

## 2020-05-12 DIAGNOSIS — R079 Chest pain, unspecified: Secondary | ICD-10-CM | POA: Diagnosis not present

## 2020-05-12 DIAGNOSIS — I5042 Chronic combined systolic (congestive) and diastolic (congestive) heart failure: Secondary | ICD-10-CM | POA: Diagnosis present

## 2020-05-12 DIAGNOSIS — R7303 Prediabetes: Secondary | ICD-10-CM | POA: Diagnosis not present

## 2020-05-12 DIAGNOSIS — Z9114 Patient's other noncompliance with medication regimen: Secondary | ICD-10-CM

## 2020-05-12 DIAGNOSIS — E785 Hyperlipidemia, unspecified: Secondary | ICD-10-CM | POA: Diagnosis present

## 2020-05-12 DIAGNOSIS — R0902 Hypoxemia: Secondary | ICD-10-CM | POA: Diagnosis not present

## 2020-05-12 DIAGNOSIS — R0789 Other chest pain: Secondary | ICD-10-CM | POA: Diagnosis not present

## 2020-05-12 DIAGNOSIS — I252 Old myocardial infarction: Secondary | ICD-10-CM

## 2020-05-12 DIAGNOSIS — Z79891 Long term (current) use of opiate analgesic: Secondary | ICD-10-CM

## 2020-05-12 DIAGNOSIS — N186 End stage renal disease: Secondary | ICD-10-CM

## 2020-05-12 DIAGNOSIS — Z951 Presence of aortocoronary bypass graft: Secondary | ICD-10-CM

## 2020-05-12 DIAGNOSIS — I491 Atrial premature depolarization: Secondary | ICD-10-CM | POA: Diagnosis not present

## 2020-05-12 DIAGNOSIS — R571 Hypovolemic shock: Secondary | ICD-10-CM | POA: Diagnosis present

## 2020-05-12 DIAGNOSIS — I724 Aneurysm of artery of lower extremity: Secondary | ICD-10-CM | POA: Diagnosis not present

## 2020-05-12 DIAGNOSIS — Z87891 Personal history of nicotine dependence: Secondary | ICD-10-CM

## 2020-05-12 DIAGNOSIS — I2510 Atherosclerotic heart disease of native coronary artery without angina pectoris: Secondary | ICD-10-CM | POA: Diagnosis not present

## 2020-05-12 LAB — COMPREHENSIVE METABOLIC PANEL
ALT: 9 U/L (ref 0–44)
AST: 14 U/L — ABNORMAL LOW (ref 15–41)
Albumin: 3.2 g/dL — ABNORMAL LOW (ref 3.5–5.0)
Alkaline Phosphatase: 54 U/L (ref 38–126)
Anion gap: 16 — ABNORMAL HIGH (ref 5–15)
BUN: 22 mg/dL (ref 8–23)
CO2: 28 mmol/L (ref 22–32)
Calcium: 7.9 mg/dL — ABNORMAL LOW (ref 8.9–10.3)
Chloride: 93 mmol/L — ABNORMAL LOW (ref 98–111)
Creatinine, Ser: 8.27 mg/dL — ABNORMAL HIGH (ref 0.61–1.24)
GFR calc Af Amer: 7 mL/min — ABNORMAL LOW (ref >60)
GFR calc non Af Amer: 6 mL/min — ABNORMAL LOW (ref >60)
Glucose, Bld: 120 mg/dL — ABNORMAL HIGH (ref 70–99)
Potassium: 3.2 mmol/L — ABNORMAL LOW (ref 3.5–5.1)
Sodium: 137 mmol/L (ref 135–145)
Total Bilirubin: 0.9 mg/dL (ref 0.3–1.2)
Total Protein: 7.3 g/dL (ref 6.5–8.1)

## 2020-05-12 LAB — CBC WITH DIFFERENTIAL/PLATELET
Abs Immature Granulocytes: 0.01 10*3/uL (ref 0.00–0.07)
Basophils Absolute: 0.1 10*3/uL (ref 0.0–0.1)
Basophils Relative: 1 %
Eosinophils Absolute: 0.5 10*3/uL (ref 0.0–0.5)
Eosinophils Relative: 7 %
HCT: 30.2 % — ABNORMAL LOW (ref 39.0–52.0)
Hemoglobin: 10.4 g/dL — ABNORMAL LOW (ref 13.0–17.0)
Immature Granulocytes: 0 %
Lymphocytes Relative: 19 %
Lymphs Abs: 1.4 10*3/uL (ref 0.7–4.0)
MCH: 32.7 pg (ref 26.0–34.0)
MCHC: 34.4 g/dL (ref 30.0–36.0)
MCV: 95 fL (ref 80.0–100.0)
Monocytes Absolute: 0.8 10*3/uL (ref 0.1–1.0)
Monocytes Relative: 11 %
Neutro Abs: 4.4 10*3/uL (ref 1.7–7.7)
Neutrophils Relative %: 62 %
Platelets: 201 10*3/uL (ref 150–400)
RBC: 3.18 MIL/uL — ABNORMAL LOW (ref 4.22–5.81)
RDW: 14.6 % (ref 11.5–15.5)
WBC: 7.2 10*3/uL (ref 4.0–10.5)
nRBC: 0 % (ref 0.0–0.2)

## 2020-05-12 LAB — LIPID PANEL
Cholesterol: 170 mg/dL (ref 0–200)
HDL: 40 mg/dL — ABNORMAL LOW (ref >40)
LDL Cholesterol: 113 mg/dL — ABNORMAL HIGH (ref 0–99)
Total CHOL/HDL Ratio: 4.3 RATIO
Triglycerides: 84 mg/dL (ref <150)
VLDL: 17 mg/dL (ref 0–40)

## 2020-05-12 LAB — TROPONIN I (HIGH SENSITIVITY)
Troponin I (High Sensitivity): 71 ng/L — ABNORMAL HIGH (ref <18)
Troponin I (High Sensitivity): 387 ng/L (ref <18)

## 2020-05-12 LAB — HEMOGLOBIN A1C
Hgb A1c MFr Bld: 5.7 % — ABNORMAL HIGH (ref 4.8–5.6)
Mean Plasma Glucose: 116.89 mg/dL

## 2020-05-12 LAB — APTT: aPTT: 195 seconds (ref 24–36)

## 2020-05-12 LAB — SARS CORONAVIRUS 2 BY RT PCR (HOSPITAL ORDER, PERFORMED IN ~~LOC~~ HOSPITAL LAB): SARS Coronavirus 2: NEGATIVE

## 2020-05-12 LAB — PROTIME-INR
INR: 1.1 (ref 0.8–1.2)
Prothrombin Time: 14 seconds (ref 11.4–15.2)

## 2020-05-12 SURGERY — CORONARY/GRAFT ACUTE MI REVASCULARIZATION
Anesthesia: LOCAL

## 2020-05-12 MED ORDER — ASPIRIN 81 MG PO CHEW
324.0000 mg | CHEWABLE_TABLET | Freq: Once | ORAL | Status: AC
  Administered 2020-05-12: 324 mg via ORAL

## 2020-05-12 MED ORDER — CLOPIDOGREL BISULFATE 75 MG PO TABS
75.0000 mg | ORAL_TABLET | Freq: Every evening | ORAL | Status: DC
  Administered 2020-05-13 – 2020-05-16 (×5): 75 mg via ORAL
  Filled 2020-05-12 (×6): qty 1

## 2020-05-12 MED ORDER — ATORVASTATIN CALCIUM 80 MG PO TABS
80.0000 mg | ORAL_TABLET | Freq: Every day | ORAL | Status: DC
  Administered 2020-05-13 – 2020-05-16 (×4): 80 mg via ORAL
  Filled 2020-05-12 (×4): qty 1

## 2020-05-12 MED ORDER — HEPARIN (PORCINE) 25000 UT/250ML-% IV SOLN
1050.0000 [IU]/h | INTRAVENOUS | Status: DC
  Administered 2020-05-12 – 2020-05-13 (×2): 1050 [IU]/h via INTRAVENOUS
  Filled 2020-05-12 (×2): qty 250

## 2020-05-12 MED ORDER — SODIUM CHLORIDE 0.9 % IV SOLN: INTRAVENOUS | Status: DC

## 2020-05-12 MED ORDER — HEPARIN (PORCINE) 25000 UT/250ML-% IV SOLN: 1050.0000 [IU]/h | INTRAVENOUS | Status: DC

## 2020-05-12 MED ORDER — CLONAZEPAM 0.5 MG PO TABS
0.5000 mg | ORAL_TABLET | Freq: Every day | ORAL | Status: DC
  Administered 2020-05-13 – 2020-05-16 (×4): 0.5 mg via ORAL
  Filled 2020-05-12 (×5): qty 1

## 2020-05-12 MED ORDER — ONDANSETRON HCL 4 MG PO TABS: 4.0000 mg | ORAL_TABLET | Freq: Four times a day (QID) | ORAL | Status: DC | PRN

## 2020-05-12 MED ORDER — ASPIRIN EC 81 MG PO TBEC
81.0000 mg | DELAYED_RELEASE_TABLET | Freq: Every day | ORAL | Status: DC
  Administered 2020-05-13 – 2020-05-16 (×3): 81 mg via ORAL
  Filled 2020-05-12 (×3): qty 1

## 2020-05-12 MED ORDER — ONDANSETRON HCL 4 MG/2ML IJ SOLN: 4.0000 mg | Freq: Four times a day (QID) | INTRAMUSCULAR | Status: DC | PRN

## 2020-05-12 MED ORDER — HEPARIN SODIUM (PORCINE) 5000 UNIT/ML IJ SOLN
4000.0000 [IU] | Freq: Once | INTRAMUSCULAR | Status: AC
  Administered 2020-05-12: 4000 [IU] via INTRAVENOUS

## 2020-05-12 NOTE — Consult Note (Signed)
[facility] Consult Note   Primary Physician:  [name], MD Primary Cardiologist:   [name], Peter, MD  Reason for Consultation:  Chest pain  HPI:    Kenneth [name] is a 64 year-old male with a history of 3-vessel CAD (medically managed since patient not a candidate for CABG/High risk PCIs), diastolic heart failure, heavy ETOH use, atrial flutter/fibrillation (not an anticoagulation candidate), ESRD on HD (TTS), hypertension, and hyperlipidemia, who presented to the hospital with complaints of substernal chest pain. He had a dialysis session earlier in the day. Post HD, at home, the family noted that the patient was somnolent and not acting appropriately. On EMS arrival the patient was diaphoretic. He had 4 episodes of vomiting. The BP was 86/42 mmHg. Given 450 cc of normal saline and Zofran. In the ED the patient complained of 7/10 chest pain, pressure-like, substernal, without radiation. He denied any shortness of breath but continued to be nauseous. The ECG at 18:46 showed atrial fibrillation, rate 67 bpm, with ST elevations in leads III and aVF and ST depressions anteriorly and laterally. Lowest BP was 83/47 mmHg.   With improvement in the BP to 128/76 mmHg, the patient became more alert with improvement in the chest pain. A repeat ECG revealed significant improvement in the ST changes (with near normalization of the ST elevations). CXR showed chronic bronchitic changes and no evidence of vascular congestion.  A Code STEMI was initially called and later cancelled (since chest pain/ECG changes got better with improvement in BP).  Previous cardiac history: 2017:  EF 25-30% July 2017: Afib/flutter - not started on anticoagulation (concerns about compliance) Feb 2019:  Normalization of LVEF (65-70%) May 2019: Admitted with pulm edema (non-compliance with HD), PEA arrest in ED, hyperkalemia, wide complex rhythm - Tx for hyperkalemia, shock for VT, Amio for Afib. 03/09/2018: Echo:  severe LVH, EF 55-60%, decreased RV fx Nov 2019: Admitted with CP, Cath 09/02/2018-multivessel CAD involving distal left main, ostial to proximal LAD, ostial to proximal left circumflex, mid RCA 09/03/2018: CT Surgery eval (Dr. Prescott Gum) - pt not a candidate for CABG. Also not considered for PCI 09/17/2018: Admitted with CP, code STEMI, not taken to cath, medically managed    Home Medications Prior to Admission medications   Medication Sig Start Date End Date Taking? Authorizing Provider  atorvastatin (LIPITOR) 80 MG tablet Take 1 tablet (80 mg total) by mouth daily. 03/16/20   [name], [name], MD  clonazePAM (KLONOPIN) 0.5 MG tablet Take by mouth daily. 02/11/18   [provider]  clopidogrel (PLAVIX) 75 MG tablet Take 75 mg by mouth every evening.    [provider]  cyclobenzaprine (FLEXERIL) 10 MG tablet Take 1 tablet (10 mg total) by mouth 3 (three) times daily as needed for muscle spasms. 02/24/20   Laurey Morale, MD  diphenhydramine-acetaminophen (TYLENOL PM EXTRA STRENGTH) 25-500 MG TABS tablet Take by mouth as needed. 05/12/18   [provider]  ibuprofen (ADVIL) 800 MG tablet Take by mouth as needed. 05/21/17   [provider]  isosorbide mononitrate (IMDUR) 60 MG 24 hr tablet Take 1 tablet by mouth in the evening 01/26/20   [name], [name], MD  lisinopril (ZESTRIL) 5 MG tablet Take 5 mg by mouth at bedtime. 01/12/20   [provider]  LOPERAMIDE HCL PO Take by mouth daily. 09/08/19 09/06/20  [provider]  methylPREDNISolone (MEDROL DOSEPAK) 4 MG TBPK tablet As directed 02/24/20   Laurey Morale, MD  metoprolol tartrate (LOPRESSOR) 25 MG tablet Take by mouth daily. 05/12/18   [provider]  nitroGLYCERIN (NITROSTAT) 0.4 MG SL tablet Place 0.4 mg under the tongue every 5 (five) minutes x 3 doses as needed for chest pain.    [provider]  oxyCODONE (OXY IR/ROXICODONE) 5 MG immediate release tablet Take 1 tablet (5 mg  total) by mouth every 6 (six) hours as needed for moderate pain. 06/21/19   Ulyses Amor, PA-C    Past Medical History: Past Medical History:  Diagnosis Date   Acute CHF ([place]) 01/2018   Cardiomyopathy secondary    likely related to HTN heart disease; possibly ETOH related as well   Chronic combined systolic and diastolic heart failure ([place])    Echocardiogram 09/22/11: Moderate LVH, EF 70-62%, grade 3 diastolic dysfunction, mild MR, moderate to severe LAE, mild RVE, mild to moderate TR, small to moderate pericardial effusion   Coronary artery disease    not felt to be candidate for CABG 08/2018; medical thearpy recomended due to high risk of PCI    Dysrhythmia    aflutter 04/2016, afib 02/2018, not felt to be a candidate for anticoagulation due to non-compliance and ETOH   ESRD (end stage renal disease) on dialysis ([place])    due to hypertensive nephrosclerosis; TTS; Richarda Blade. (09/01/2018)   History of alcohol abuse    Hypertension    Myocardial infarction [facility])    " mild " per daughter   PEA (Pulseless electrical activity) ([place])    PEA arrest 03/09/18, treated empirically for hyperkalemia, shock x1 for WCT, given amiodarone, ROSC after 10 min of ACLS    Past Surgical History: Past Surgical History:  Procedure Laterality Date   AV FISTULA PLACEMENT Left 05/14/2016   Procedure: LEFT ARM BASILIC VEIN TRANSPOSITION;  Surgeon: Rosetta Posner, MD;  Location: [facility];  Service: Vascular;  Laterality: Left;   INSERTION OF DIALYSIS CATHETER N/A 06/20/2019   Procedure: INSERTION OF TUNNELED  DIALYSIS CATHETER;  Surgeon: Elam Dutch, MD;  Location: [facility];  Service: Vascular;  Laterality: N/A;   LEFT HEART CATH AND CORONARY ANGIOGRAPHY N/A 09/02/2018   Procedure: LEFT HEART CATH AND CORONARY ANGIOGRAPHY;  Surgeon: Nelva Bush, MD;  Location: [facility];  Service: Cardiovascular;  Laterality: N/A;   PERIPHERAL VASCULAR CATHETERIZATION N/A 05/13/2016   Procedure:  Dialysis/Perma Catheter Insertion;  Surgeon: Serafina Mitchell, MD;  Location: [facility];  Service: Cardiovascular;  Laterality: N/A;   REVISION OF ARTERIOVENOUS GORETEX GRAFT Left 3/76/2831   Procedure: PLICATION OF ARTERIOVENOUS FISTULA LEFT ARM;  Surgeon: Elam Dutch, MD;  Location: [facility] OR;  Service: Vascular;  Laterality: Left;    Family History: Family History  Problem Relation Age of Onset   Emphysema Mother    Cirrhosis Father     Social History: Social History   Socioeconomic History   Marital status: Divorced    Spouse name: Not on file   Number of children: Not on file   Years of education: Not on file   Highest education level: Not on file  Occupational History   Not on file  Tobacco Use   Smoking status: Former Smoker    Packs/day: 2.00    Years: 48.00    Pack years: 96.00    Types: Cigars    Quit date: 10/27/2017    Years since quitting: 2.5   Smokeless tobacco: Former Systems developer    Types: Nurse, children's Use: Never  used  Substance and Sexual Activity   Alcohol use: Not Currently   Drug use: No   Sexual activity: Not Currently  Other Topics Concern   Not on file  Social History Narrative   Not on file   Social Determinants of Health   Financial Resource Strain:    Difficulty of Paying Living Expenses:   Food Insecurity:    Worried About Charity fundraiser in the Last Year:    Arboriculturist in the Last Year:   Transportation Needs:    Film/video editor (Medical):    Lack of Transportation (Non-Medical):   Physical Activity:    Days of Exercise per Week:    Minutes of Exercise per Session:   Stress:    Feeling of Stress :   Social Connections:    Frequency of Communication with Friends and Family:    Frequency of Social Gatherings with Friends and Family:    Attends Religious Services:    Active Member of Clubs or Organizations:    Attends Music therapist:    Marital Status:       Allergies:  No Known Allergies   Review of Systems: [y] = yes, [ ]  = no    General: Weight gain [ ] ; Weight loss [ ] ; Anorexia [ ] ; Fatigue [ ] ; Fever [ ] ; Chills [ ] ; Weakness [ ]    Cardiac: Chest pain/pressure [Y]; Resting SOB [ ] ; Exertional SOB [ ] ; Orthopnea [ ] ; Pedal Edema [ ] ; Palpitations [ ] ; Syncope [ ] ; Presyncope [ ] ; Paroxysmal nocturnal dyspnea[ ]    Pulmonary: Cough [ ] ; Wheezing[ ] ; Hemoptysis[ ] ; Sputum [ ] ; Snoring [ ]    GI: Vomiting[ ] ; Dysphagia[ ] ; Melena[ ] ; Hematochezia [ ] ; Heartburn[ ] ; Abdominal pain [ ] ; Constipation [ ] ; Diarrhea [ ] ; BRBPR [ ]    GU: Hematuria[ ] ; Dysuria [ ] ; Nocturia[ ]    Vascular: Pain in legs with walking [ ] ; Pain in feet with lying flat [ ] ; Non-healing sores [ ] ; Stroke [ ] ; TIA [ ] ; Slurred speech [ ] ;   Neuro: Headaches[ ] ; Vertigo[ ] ; Seizures[ ] ; Paresthesias[ ] ;Blurred vision [ ] ; Diplopia [ ] ; Vision changes [ ]    Ortho/Skin: Arthritis [ ] ; Joint pain [ ] ; Muscle pain [ ] ; Joint swelling [ ] ; Back Pain [ ] ; Rash [ ]    Psych: Depression[ ] ; Anxiety[ ]    Heme: Bleeding problems [ ] ; Clotting disorders [ ] ; Anemia [ ]    Endocrine: Diabetes [ ] ; Thyroid dysfunction[ ]      Objective:    Vital Signs:   Temp:  [97.6 F (36.4 C)] 97.6 F (36.4 C) (07/17 1844) Pulse Rate:  [69-79] 79 (07/17 1918) Resp:  [15-17] 15 (07/17 1918) BP: (83-128)/(47-67) 128/67 (07/17 1918) SpO2:  [88 %-96 %] 92 % (07/17 1918)    Weight change: There were no vitals filed for this visit.  Intake/Output:  No intake or output data in the 24 hours ending 05/12/20 1933    Physical Exam    General:  Ill appearing HEENT: normal Neck: supple. JVP . Carotids 2+ bilat; no bruits.  Cor: Irregularly irregular, soft systolic murmur Lungs: clear to auscultation anteriorly Abdomen: soft, nontender, nondistended.  Extremities: no cyanosis, clubbing, rash, edema. AV fistula in the left arm. Neuro: alert & oriented x 2 (name and place, not to  time/date), cranial nerves grossly intact. moves all 4 extremities w/o difficulty.  Affect: normal   Labs   Basic Metabolic Panel: No  results for input(s): NA, K, CL, CO2, GLUCOSE, BUN, CREATININE, CALCIUM, MG, PHOS in the last 168 hours.  Liver Function Tests: No results for input(s): AST, ALT, ALKPHOS, BILITOT, PROT, ALBUMIN in the last 168 hours. No results for input(s): LIPASE, AMYLASE in the last 168 hours. No results for input(s): AMMONIA in the last 168 hours.  CBC: No results for input(s): WBC, NEUTROABS, HGB, HCT, MCV, PLT in the last 168 hours.  Cardiac Enzymes: No results for input(s): CKTOTAL, CKMB, CKMBINDEX, TROPONINI in the last 168 hours.  BNP: BNP (last 3 results) No results for input(s): BNP in the last 8760 hours.  ProBNP (last 3 results) No results for input(s): PROBNP in the last 8760 hours.   CBG: No results for input(s): GLUCAP in the last 168 hours.  Coagulation Studies: No results for input(s): LABPROT, INR in the last 72 hours.   Imaging    No results found.   Medications:     Current Medications:   Infusions:  sodium chloride         Assessment/Plan   1. Chest pain with ST changes on ECG The patient had inferior ST elevations initially when the systolic BP was in the 81E. With fluid resuscitation and improvement in BP the ST elevations normalized and the patient stated that his chest pain was better. Code STEMI was therefore cancelled. Transient abnormal electrocardiographic changes likely due to demand ischemia and low coronary perfusion in the setting of hypotension. Emphasis therefore on blood pressure optimization.  -Serial ECGs -Trend cardiac enzymes -ASA 81 mg daily -Clopidogrel 75 mg daily -High dose statins -Unfractionated heparin IV infusion (ACS dose)  2. Hypotension Could be due to volume depletion post HD. Other etiologies include transient cardiac ischemia or vasovagal episode.  -Small bolus of NS (approx  250 to 500 cc) and then reassess hemodynamics -For continued hypotension consider low-dose levophed -2-D echo to re-evaluate LV function   3. History of heart failure Clinical exam not consistent with acute heart failure  -Strict I and Os -Daily weights -Fluid management per renal service (by HD schedule)   4. Atrial flutter/fibrillation Telemetry/ECG documents chronic atrial fibrillation (rate-controlled). The patient has previously not been a candidate for oral anticoagulation due to concerns regarding medication compliance and ETOH use.    Meade Maw, MD  05/12/2020, 7:33 PM  Cardiology Overnight Team Please contact [facility] Cardiology for night-coverage after hours (4p -7a ) and weekends on [email or web address]

## 2020-05-12 NOTE — H&P (Signed)
History and Physical    Kenneth [name] NOB:[id number] DOB: [birth year] DOA: 05/12/2020  PCP: [name], MD  Patient coming from: Home.  Chief Complaint: Chest pain.  HPI: Kenneth [name] is a 64 y.o. male with history of severe coronary artery disease medically managed not a candidate for intervention, alcohol abuse A. fib not on anticoagulation candidate ESRD on hemodialysis hypertension hyperlipidemia who presents to the ER after patient started developing chest pain while having dialysis.  Pain was retrosternal nonradiating pressure-like associated nausea vomiting.  No fever chills or productive cough.  ED Course: In the ER patient was found to be hypotensive with blood pressure systolic in the 86 was given 500 cc normal saline bolus and Zofran.  Abdomen appears benign on exam chest x-ray unremarkable.  EKG was showing initially ST elevation the inferolateral leads which actually resolved with blood pressure improving.  A code STEMI was called and Dr. Ellyn Hack and Dr. Olena Heckle cardiologist had seen the patient and since patient's dysuria patient has resolved at this time they are planning medical management.  Labs are remarkable for high sensitive troponin of 71 and 387 chest x-ray unremarkable hemoglobin 10.4.  Covid test was negative.  Review of Systems: As per HPI, rest all negative.   Past Medical History:  Diagnosis Date   Acute CHF ([place]) 01/2018   Cardiomyopathy secondary    likely related to HTN heart disease; possibly ETOH related as well   Chronic combined systolic and diastolic heart failure ([place])    Echocardiogram 09/22/11: Moderate LVH, EF 94-76%, grade 3 diastolic dysfunction, mild MR, moderate to severe LAE, mild RVE, mild to moderate TR, small to moderate pericardial effusion   Coronary artery disease    not felt to be candidate for CABG 08/2018; medical thearpy recomended due to high risk of PCI    Dysrhythmia    aflutter 04/2016, afib 02/2018, not felt to be a  candidate for anticoagulation due to non-compliance and ETOH   ESRD (end stage renal disease) on dialysis ([place])    due to hypertensive nephrosclerosis; TTS; [facility]. (09/01/2018)   History of alcohol abuse    Hypertension    Myocardial infarction [facility])    " mild " per daughter   PEA (Pulseless electrical activity) ([place])    PEA arrest 03/09/18, treated empirically for hyperkalemia, shock x1 for WCT, given amiodarone, ROSC after 10 min of ACLS    Past Surgical History:  Procedure Laterality Date   AV FISTULA PLACEMENT Left 05/14/2016   Procedure: LEFT ARM BASILIC VEIN TRANSPOSITION;  Surgeon: Rosetta Posner, MD;  Location: [place];  Service: Vascular;  Laterality: Left;   INSERTION OF DIALYSIS CATHETER N/A 06/20/2019   Procedure: INSERTION OF TUNNELED  DIALYSIS CATHETER;  Surgeon: Elam Dutch, MD;  Location: [facility];  Service: Vascular;  Laterality: N/A;   LEFT HEART CATH AND CORONARY ANGIOGRAPHY N/A 09/02/2018   Procedure: LEFT HEART CATH AND CORONARY ANGIOGRAPHY;  Surgeon: Nelva Bush, MD;  Location: [place] [facility];  Service: Cardiovascular;  Laterality: N/A;   PERIPHERAL VASCULAR CATHETERIZATION N/A 05/13/2016   Procedure: Dialysis/Perma Catheter Insertion;  Surgeon: Serafina Mitchell, MD;  Location: [place] [facility];  Service: Cardiovascular;  Laterality: N/A;   REVISION OF ARTERIOVENOUS GORETEX GRAFT Left 5/46/5035   Procedure: PLICATION OF ARTERIOVENOUS FISTULA LEFT ARM;  Surgeon: Elam Dutch, MD;  Location: [facility] OR;  Service: Vascular;  Laterality: Left;     reports that he quit smoking about 2 years ago.  His smoking use included cigars. He has a 96.00 pack-year smoking history. He has quit using smokeless tobacco.  His smokeless tobacco use included chew. He reports previous alcohol use. He reports that he does not use drugs.  No Known Allergies  Family History  Problem Relation Age of Onset   Emphysema Mother    Cirrhosis Father     Prior to Admission  medications   Medication Sig Start Date End Date Taking? Authorizing Provider  atorvastatin (LIPITOR) 80 MG tablet Take 1 tablet (80 mg total) by mouth daily. 03/16/20  Yes [name], [name], MD  clonazePAM (KLONOPIN) 0.5 MG tablet Take 0.5 mg by mouth daily.  02/11/18  Yes [provider]  clopidogrel (PLAVIX) 75 MG tablet Take 75 mg by mouth every evening.   Yes [provider]  diphenhydramine-acetaminophen (TYLENOL PM EXTRA STRENGTH) 25-500 MG TABS tablet Take 2 tablets by mouth at bedtime as needed (Pain, Sleep).  05/12/18  Yes [provider]  ibuprofen (ADVIL) 800 MG tablet Take 1,600 mg by mouth daily as needed for fever or moderate pain.  05/21/17  Yes [provider]  isosorbide mononitrate (IMDUR) 60 MG 24 hr tablet Take 1 tablet by mouth in the evening Patient taking differently: Take 60 mg by mouth daily.  01/26/20  Yes [name], [name], MD  cyclobenzaprine (FLEXERIL) 10 MG tablet Take 1 tablet (10 mg total) by mouth 3 (three) times daily as needed for muscle spasms. Patient not taking: Reported on 05/12/2020 02/24/20   Laurey Morale, MD  methylPREDNISolone (MEDROL DOSEPAK) 4 MG TBPK tablet As directed Patient not taking: Reported on 05/12/2020 02/24/20   Laurey Morale, MD  nitroGLYCERIN (NITROSTAT) 0.4 MG SL tablet Place 0.4 mg under the tongue every 5 (five) minutes x 3 doses as needed for chest pain.    [provider]  oxyCODONE (OXY IR/ROXICODONE) 5 MG immediate release tablet Take 1 tablet (5 mg total) by mouth every 6 (six) hours as needed for moderate pain. Patient not taking: Reported on 05/12/2020 06/21/19   Ulyses Amor, PA-C    Physical Exam: Constitutional: Moderately built and nourished. Vitals:   05/12/20 1922 05/12/20 2046 05/12/20 2215 05/12/20 2238  BP:  (!) 144/88 120/73 (!) 147/88  Pulse:   75 64  Resp:  (!) 26 18 18   Temp:   (!) 97.4 F (36.3 C) 97.6 F (36.4 C)  TempSrc:   Axillary Oral  SpO2:   93% 100%  Weight: 90.6  kg     Height: 5\' 11"  (1.803 m)      Eyes: Anicteric no pallor. ENMT: No discharge from the ears eyes nose or mouth. Neck: No mass felt.  No neck rigidity. Respiratory: No rhonchi or crepitations. Cardiovascular: S1-S2 heard. Abdomen: Soft nontender bowel sounds present. Musculoskeletal: No edema. Skin: No rash. Neurologic: Alert awake oriented to time place and person.  Moves all extremities. Psychiatric: Appears normal per normal affect.   Labs on Admission: I have personally reviewed following labs and imaging studies  CBC: Recent Labs  Lab 05/12/20 2015  WBC 7.2  NEUTROABS 4.4  HGB 10.4*  HCT 30.2*  MCV 95.0  PLT 664   Basic Metabolic Panel: Recent Labs  Lab 05/12/20 2015  NA 137  K 3.2*  CL 93*  CO2 28  GLUCOSE 120*  BUN 22  CREATININE 8.27*  CALCIUM 7.9*   GFR: Estimated Creatinine Clearance: 10.1 mL/min (A) (by C-G formula based on SCr of 8.27 mg/dL (H)). Liver Function  Tests: Recent Labs  Lab 05/12/20 2015  AST 14*  ALT 9  ALKPHOS 54  BILITOT 0.9  PROT 7.3  ALBUMIN 3.2*   No results for input(s): LIPASE, AMYLASE in the last 168 hours. No results for input(s): AMMONIA in the last 168 hours. Coagulation Profile: Recent Labs  Lab 05/12/20 2015  INR 1.1   Cardiac Enzymes: No results for input(s): CKTOTAL, CKMB, CKMBINDEX, TROPONINI in the last 168 hours. BNP (last 3 results) No results for input(s): PROBNP in the last 8760 hours. HbA1C: Recent Labs    05/12/20 2015  HGBA1C 5.7*   CBG: No results for input(s): GLUCAP in the last 168 hours. Lipid Profile: Recent Labs    05/12/20 2015  CHOL 170  HDL 40*  LDLCALC 113*  TRIG 84  CHOLHDL 4.3   Thyroid Function Tests: No results for input(s): TSH, T4TOTAL, FREET4, T3FREE, THYROIDAB in the last 72 hours. Anemia Panel: No results for input(s): VITAMINB12, FOLATE, FERRITIN, TIBC, IRON, RETICCTPCT in the last 72 hours. Urine analysis:    Component Value Date/Time   COLORURINE YELLOW  05/12/2016 0834   APPEARANCEUR CLOUDY (A) 05/12/2016 0834   LABSPEC 1.014 05/12/2016 0834   PHURINE 5.0 05/12/2016 0834   GLUCOSEU NEGATIVE 05/12/2016 0834   HGBUR MODERATE (A) 05/12/2016 0834   BILIRUBINUR NEGATIVE 05/12/2016 0834   KETONESUR NEGATIVE 05/12/2016 0834   PROTEINUR 100 (A) 05/12/2016 0834   UROBILINOGEN 0.2 12/09/2013 0013   NITRITE NEGATIVE 05/12/2016 0834   LEUKOCYTESUR NEGATIVE 05/12/2016 0834   Sepsis Labs: @LABRCNTIP (procalcitonin:4,lacticidven:4) ) Recent Results (from the past 240 hour(s))  SARS Coronavirus 2 by RT PCR (hospital order, performed in [facility] hospital lab) Nasopharyngeal Nasopharyngeal Swab     Status: None   Collection Time: 05/12/20  7:30 PM   Specimen: Nasopharyngeal Swab  Result Value Ref Range Status   SARS Coronavirus 2 NEGATIVE NEGATIVE Final    Comment: (NOTE) SARS-CoV-2 target nucleic acids are NOT DETECTED.  The SARS-CoV-2 RNA is generally detectable in upper and lower respiratory specimens during the acute phase of infection. The lowest concentration of SARS-CoV-2 viral copies this assay can detect is 250 copies / mL. A negative result does not preclude SARS-CoV-2 infection and should not be used as the sole basis for treatment or other patient management decisions.  A negative result may occur with improper specimen collection / handling, submission of specimen other than nasopharyngeal swab, presence of viral mutation(s) within the areas targeted by this assay, and inadequate number of viral copies (<250 copies / mL). A negative result must be combined with clinical observations, patient history, and epidemiological information.  Fact Sheet for Patients:   StrictlyIdeas.no  Fact Sheet for Healthcare Providers: BankingDealers.co.za  This test is not yet approved or  cleared by the [place] FDA and has been authorized for detection and/or diagnosis of SARS-CoV-2 by FDA  under an Emergency Use Authorization (EUA).  This EUA will remain in effect (meaning this test can be used) for the duration of the COVID-19 declaration under Section 564(b)(1) of the Act, 21 U.S.C. section 360bbb-3(b)(1), unless the authorization is terminated or revoked sooner.  Performed at [facility], [place] [address], [place], [place] [address]      Radiological Exams on Admission: DG Chest Port 1 View  Result Date: 05/12/2020 CLINICAL DATA:  CP EXAM: PORTABLE CHEST 1 VIEW COMPARISON:  Chest radiograph 06/20/2019 FINDINGS: Interval removal of a right central venous catheter. Stable cardiomediastinal contours with enlarged heart size. Aortic arch calcification. There  is diffuse bilateral coarsening of the interstitium which likely represents chronic bronchitic change. No focal consolidation. No pneumothorax or large pleural effusion. No acute finding in the visualized skeleton. IMPRESSION: Cardiomegaly. Diffuse coarsening of the interstitium likely chronic bronchitic change. Electronically Signed   By: Audie Pinto M.D.   On: 05/12/2020 19:51    EKG: Independently reviewed.  Initial EKG was showing ST elevation in inferolateral leads improved with blood pressure improvement.  A. fib.  Assessment/Plan Principal Problem:   NSTEMI (non-ST elevated myocardial infarction) ([place]) Active Problems:   Essential hypertension   ESRD on hemodialysis ([place])   Atrial fibrillation and flutter ([facility])   Non-ST elevated myocardial infarction ([place])    1. Acute myocardial infarction -initial EKG was showing ST elevation in the inferolateral leads which improved with blood pressure improvement.  Appreciate cardiology consult and recommendation.  Patient is on heparin drip aspirin and Plavix.  Not on beta-blockers due to low normal blood pressure.  Trend cardiac markers.  At this time cardiology is planning to manage patient medically. 2. A. fib presently rate controlled.  Not a candidate for  anticoagulation per cardiology. 3. ESRD on hemodialysis on Tuesday Thursdays and Saturday.  Has had dialysis today. 4. Anemia likely from renal disease follow CBC. 5. History of alcohol abuse monitor for any withdrawal.  Since patient has acute myocardial infarction will need close monitoring for any further worsening in inpatient status.   DVT prophylaxis: Heparin infusion. Code Status: Full code. Family Communication: Discussed with patient. Disposition Plan: Home. Consults called: Cardiology. Admission status: Inpatient.   Rise Patience MD [facility] Pager [phone].  If 7PM-7AM, please contact night-coverage [email or web address] Password [facility]  05/12/2020, 10:45 PM

## 2020-05-12 NOTE — Progress Notes (Signed)
RT obtained blood per MD order, under nursing communications orders

## 2020-05-12 NOTE — ED Notes (Signed)
Attempted report x1. 

## 2020-05-12 NOTE — ED Triage Notes (Addendum)
Pt BIB [facility] from home. Pt's family called stating they were concerned pt was having CP and not acting appropriately. Pt is a dialysis pt with left arm restriction. Pt finished dialysis and had been home an hr when family called EMS. EMS stated pt denied any pain but pt was diaphoretic and BP was initially 86/42. Pt received 450 ml of NS due to hx of CHF. EMS stated lungs sounded clear before and after fluids. Pt's BP after bolus is 104/62. Pt had 4 episodes of vomiting with EMS. EMS gave 4mg  of zofran.

## 2020-05-12 NOTE — Progress Notes (Signed)
ANTICOAGULATION CONSULT NOTE  Pharmacy Consult for Heparin Indication: chest pain/ACS  No Known Allergies  Patient Measurements: Height: 5\' 11"  (180.3 cm) Weight: 90.6 kg (199 lb 12.8 oz) IBW/kg (Calculated) : 75.3 Heparin Dosing Weight: 90.6 kg  Vital Signs: Temp: 97.6 F (36.4 C) (07/17 1844) Temp Source: Rectal (07/17 1844) BP: 128/67 (07/17 1918) Pulse Rate: 79 (07/17 1918)  Labs: No results for input(s): HGB, HCT, PLT, APTT, LABPROT, INR, HEPARINUNFRC, HEPRLOWMOCWT, CREATININE, CKTOTAL, CKMB, TROPONINIHS in the last 72 hours.  CrCl cannot be calculated (Patient's most recent lab result is older than the maximum 21 days allowed.).   Medical History: Past Medical History:  Diagnosis Date  . Acute CHF ([place]) 01/2018  . Cardiomyopathy secondary    likely related to HTN heart disease; possibly ETOH related as well  . Chronic combined systolic and diastolic heart failure ([facility])    Echocardiogram 09/22/11: Moderate LVH, EF 19-14%, grade 3 diastolic dysfunction, mild MR, moderate to severe LAE, mild RVE, mild to moderate TR, small to moderate pericardial effusion  . Coronary artery disease    not felt to be candidate for CABG 08/2018; medical thearpy recomended due to high risk of PCI   . Dysrhythmia    aflutter 04/2016, afib 02/2018, not felt to be a candidate for anticoagulation due to non-compliance and ETOH  . ESRD (end stage renal disease) on dialysis ([place])    due to hypertensive nephrosclerosis; TTS; [address] (09/01/2018)  . History of alcohol abuse   . Hypertension   . Myocardial infarction [facility])    " mild " per daughter  . PEA (Pulseless electrical activity) ([place])    PEA arrest 03/09/18, treated empirically for hyperkalemia, shock x1 for WCT, given amiodarone, ROSC after 10 min of ACLS    Medications:  Scheduled:  . heparin  4,000 Units Intravenous Once    Assessment: Patient is a 30 yom that presents to the ED with weakness and chest pain. The patient had a  concerning looking EKG for a STEMI. The patient has been see for this before with discussion regarding the risk for intervention. At this time pharmacy has been asked to dose heparin for ACS.  Goal of Therapy:  Heparin level 0.3-0.7 units/ml Monitor platelets by anticoagulation protocol: Yes   Plan:  - Heparin bolus 4000 units  IV x 1 dose - Heparin drip @ 1050 units/hr - Heparin level in ~ 6 hours  - Monitor patient for s/s of bleeding and CBC while on heparin   Duanne Limerick PharmD. BCPS  05/12/2020,7:36 PM

## 2020-05-12 NOTE — ED Notes (Signed)
Kenneth [name] wife [phone] aware pt just arrived, would like an update when one is available

## 2020-05-12 NOTE — ED Notes (Signed)
Code Stemi paged per Dr. Stark Jock

## 2020-05-12 NOTE — ED Provider Notes (Addendum)
[facility] EMERGENCY DEPARTMENT Provider Note   CSN: [id number] Arrival date & time: 05/12/20  1836     History Chief Complaint  Patient presents with  . Hypotension    Kenneth [name] is a 64 y.o. male.  Patient is a 64 year old male with extensive past medical history including end-stage renal disease on hemodialysis, extensive coronary artery disease not amenable to intervention.  He presents today for evaluation of chest pain.  This began just prior to arrival.  He denies injury or trauma.  He does describe some shortness of breath, nausea, and diaphoresis.  There are no aggravating or alleviating factors.  The history is provided by the patient and the EMS personnel.       Past Medical History:  Diagnosis Date  . Acute CHF ([place]) 01/2018  . Cardiomyopathy secondary    likely related to HTN heart disease; possibly ETOH related as well  . Chronic combined systolic and diastolic heart failure (HCC)    Echocardiogram 09/22/11: Moderate LVH, EF 65-78%, grade 3 diastolic dysfunction, mild MR, moderate to severe LAE, mild RVE, mild to moderate TR, small to moderate pericardial effusion  . Coronary artery disease    not felt to be candidate for CABG 08/2018; medical thearpy recomended due to high risk of PCI   . Dysrhythmia    aflutter 04/2016, afib 02/2018, not felt to be a candidate for anticoagulation due to non-compliance and ETOH  . ESRD (end stage renal disease) on dialysis ([place])    due to hypertensive nephrosclerosis; TTS; [address] (09/01/2018)  . History of alcohol abuse   . Hypertension   . Myocardial infarction [facility])    " mild " per daughter  . PEA (Pulseless electrical activity) ([place])    PEA arrest 03/09/18, treated empirically for hyperkalemia, shock x1 for WCT, given amiodarone, ROSC after 10 min of ACLS    Patient Active Problem List   Diagnosis Date Noted  . DNR (do not resuscitate) discussion   . Palliative care by specialist   . Goals of  care, counseling/discussion   . Coronary artery disease involving native heart with angina pectoris ([place])   . STEMI (ST elevation myocardial infarction) ([place]) 09/17/2018  . Unstable angina ([place])   . ACS (acute coronary syndrome) ([place]) 09/01/2018  . ESRD (end stage renal disease) ([place]) 07/20/2018  . Acute respiratory failure ([place]) 05/11/2018  . Cardiac arrest ([place]) 03/09/2018  . Acute encephalopathy   . Acute on chronic respiratory failure ([place]) 02/16/2018  . Tobacco dependence 02/16/2018  . Pulmonary edema 01/26/2018  . Acute respiratory failure with hypoxia ([place]) 01/26/2018  . Atrial fibrillation and flutter ([place])   . Shortness of breath   . Acute on chronic diastolic CHF (congestive heart failure) ([place])   . Hypothermia 10/08/2016  . ESRD on hemodialysis ([place]) 10/08/2016  . Fall 10/08/2016  . Syncope 10/07/2016  . Near syncope 07/07/2016  . Cardiomyopathy ([place]) 07/07/2016  . Ventricular tachycardia, non-sustained ([place]) 07/06/2016  . Hypoglycemia 07/06/2016  . CKD (chronic kidney disease) stage 5, GFR less than 15 ml/min (HCC)   . Alcoholism ([place]) 05/13/2016  . Hypertensive emergency 12/08/2013  . Cardiomyopathy secondary 10/09/2011  . Essential hypertension 10/09/2011  . Alcohol withdrawal delirium ([place]) 09/25/2011  . Chronic diastolic CHF (congestive heart failure) ([place]) 09/24/2011    Past Surgical History:  Procedure Laterality Date  . AV FISTULA PLACEMENT Left 05/14/2016   Procedure: LEFT ARM BASILIC VEIN TRANSPOSITION;  Surgeon: Rosetta Posner, MD;  Location: [facility];  Service: Vascular;  Laterality: Left;  . INSERTION OF DIALYSIS CATHETER N/A 06/20/2019   Procedure: INSERTION OF TUNNELED  DIALYSIS CATHETER;  Surgeon: Elam Dutch, MD;  Location: [facility];  Service: Vascular;  Laterality: N/A;  . LEFT HEART CATH AND CORONARY ANGIOGRAPHY N/A 09/02/2018   Procedure: LEFT HEART CATH AND CORONARY ANGIOGRAPHY;  Surgeon: Nelva Bush, MD;  Location: [place] [facility] CV LAB;  Service:  Cardiovascular;  Laterality: N/A;  . PERIPHERAL VASCULAR CATHETERIZATION N/A 05/13/2016   Procedure: Dialysis/Perma Catheter Insertion;  Surgeon: Serafina Mitchell, MD;  Location: [facility] CV LAB;  Service: Cardiovascular;  Laterality: N/A;  . REVISION OF ARTERIOVENOUS GORETEX GRAFT Left 7/56/4332   Procedure: PLICATION OF ARTERIOVENOUS FISTULA LEFT ARM;  Surgeon: Elam Dutch, MD;  Location: [facility] OR;  Service: Vascular;  Laterality: Left;       Family History  Problem Relation Age of Onset  . Emphysema Mother   . Cirrhosis Father     Social History   Tobacco Use  . Smoking status: Former Smoker    Packs/day: 2.00    Years: 48.00    Pack years: 96.00    Types: Cigars    Quit date: 10/27/2017    Years since quitting: 2.5  . Smokeless tobacco: Former Systems developer    Types: Secondary school teacher  . Vaping Use: Never used  Substance Use Topics  . Alcohol use: Not Currently  . Drug use: No    Home Medications Prior to Admission medications   Medication Sig Start Date End Date Taking? Authorizing Provider  atorvastatin (LIPITOR) 80 MG tablet Take 1 tablet (80 mg total) by mouth daily. 03/16/20   [name], [name], MD  clonazePAM (KLONOPIN) 0.5 MG tablet Take by mouth daily. 02/11/18   [provider]  clopidogrel (PLAVIX) 75 MG tablet Take 75 mg by mouth every evening.    [provider]  cyclobenzaprine (FLEXERIL) 10 MG tablet Take 1 tablet (10 mg total) by mouth 3 (three) times daily as needed for muscle spasms. 02/24/20   Laurey Morale, MD  diphenhydramine-acetaminophen (TYLENOL PM EXTRA STRENGTH) 25-500 MG TABS tablet Take by mouth as needed. 05/12/18   [provider]  ibuprofen (ADVIL) 800 MG tablet Take by mouth as needed. 05/21/17   [provider]  isosorbide mononitrate (IMDUR) 60 MG 24 hr tablet Take 1 tablet by mouth in the evening 01/26/20   [name], [name], MD  lisinopril (ZESTRIL) 5 MG tablet Take 5 mg by mouth at bedtime. 01/12/20   [provider]  LOPERAMIDE HCL PO Take by mouth daily. 09/08/19 09/06/20  [provider]  methylPREDNISolone (MEDROL DOSEPAK) 4 MG TBPK tablet As directed 02/24/20   Laurey Morale, MD  metoprolol tartrate (LOPRESSOR) 25 MG tablet Take by mouth daily. 05/12/18   [provider]  nitroGLYCERIN (NITROSTAT) 0.4 MG SL tablet Place 0.4 mg under the tongue every 5 (five) minutes x 3 doses as needed for chest pain.    [provider]  oxyCODONE (OXY IR/ROXICODONE) 5 MG immediate release tablet Take 1 tablet (5 mg total) by mouth every 6 (six) hours as needed for moderate pain. 06/21/19   Ulyses Amor, PA-C    Allergies    Patient has no known allergies.  Review of Systems   Review of Systems  All other systems reviewed and are negative.   Physical Exam Updated Vital Signs BP (!) 83/47 (BP Location: Right Arm)   Pulse 69   Temp 97.6  F (36.4 C) (Rectal)   Resp 16   SpO2 96%   Physical Exam Vitals and nursing note reviewed.  Constitutional:      General: He is not in acute distress.    Appearance: He is well-developed. He is ill-appearing and diaphoretic.     Comments: Patient is awake and alert, but does appear uncomfortable and acutely ill.  HENT:     Head: Normocephalic and atraumatic.  Cardiovascular:     Rate and Rhythm: Normal rate and regular rhythm.     Heart sounds: No murmur heard.  No friction rub.  Pulmonary:     Effort: Pulmonary effort is normal. No respiratory distress.     Breath sounds: Normal breath sounds. No wheezing or rales.  Abdominal:     General: Bowel sounds are normal. There is no distension.     Palpations: Abdomen is soft.     Tenderness: There is no abdominal tenderness.  Musculoskeletal:        General: Normal range of motion.     Cervical back: Normal range of motion and neck supple.  Skin:    General: Skin is warm.  Neurological:     Mental Status: He is alert and oriented to person, place, and time.      Coordination: Coordination normal.     ED Results / Procedures / Treatments   Labs (all labs ordered are listed, but only abnormal results are displayed) Labs Reviewed - No data to display  EKG EKG Interpretation  Date/Time:  Saturday May 12 2020 18:46:14 EDT Ventricular Rate:  67 PR Interval:    QRS Duration: 108 QT Interval:  486 QTC Calculation: 514 R Axis:   51 Text Interpretation: Acute Inferoposterior MI Confirmed by Veryl Speak [id number]) on 05/12/2020 6:51:27 PM   Radiology No results found.  Procedures Procedures (including critical care time)  Medications Ordered in ED Medications - No data to display  ED Course  I have reviewed the triage vital signs and the nursing notes.  Pertinent labs & imaging results that were available during my care of the patient were reviewed by me and considered in my medical decision making (see chart for details).    MDM Rules/Calculators/A&P  Patient presenting here with complaints of decreased responsiveness that began after dialysis.  Patient complaining of chest pain upon presentation.  Initial blood pressure was 90 systolic and EKG consistent with an inferior posterior MI.  A code STEMI was called and patient was evaluated by cardiology.  When they arrived, the patient symptoms were improving and his EKG changes resolved.  His initial troponin is 71 and laboratory studies are otherwise unremarkable.  Now that his chest pain has gone, his blood pressure is now in the 120s and he is awake, alert, and talking.  He has been started on a heparin drip and will be admitted to the hospitalist service for observation.  Patient had a heart cath in 2019 showing multivessel disease that was not amenable to PCI or CABG.  CRITICAL CARE Performed by: Veryl Speak Total critical care time: 45 minutes Critical care time was exclusive of separately billable procedures and treating other patients. Critical care was necessary to treat or  prevent imminent or life-threatening deterioration. Critical care was time spent personally by me on the following activities: development of treatment plan with patient and/or surrogate as well as nursing, discussions with consultants, evaluation of patient's response to treatment, examination of patient, obtaining history from patient or surrogate, ordering and performing treatments and  interventions, ordering and review of laboratory studies, ordering and review of radiographic studies, pulse oximetry and re-evaluation of patient's condition.   Final Clinical Impression(s) / ED Diagnoses Final diagnoses:  None    Rx / DC Orders ED Discharge Orders    None       Veryl Speak, MD 05/12/20 2116    Veryl Speak, MD 05/23/20 [phone]

## 2020-05-12 NOTE — Consult Note (Addendum)
Brief interventional cardiology consult note.  Primary Physician:               [name], MD Primary Cardiologist:           [name], Peter, MD Reason for Consultation:     Chest pain; inferior lead ST elevation Referring Physician: Dr. Stark Jock (ER Physician):  Chief Complaint  Patient presents with   Transient ST Elevation in Inferior Leads - resolved with improved BP   Known Severe MV CAD - LM-LAD/LCX & RCA --> turned down for CABG & PCI thought to be prohibitive risk.   Marland Kitchen Hypotension   HPI Was called to the ER for possible code STEMI -> by Dr. Stark Jock (ER Physician:.  Patient has known severe multivessel disease including mid RCA focal 70% lesion as well as distal left main-bifurcation LAD/LCx as well as LCx bifurcation lesion previously felt to have no obvious revascularization options.  Was turned down for CABG.  High risk PCI would be extensive and difficult with likely prohibitive risk.  This was partly as a result of concern for medical nonadherence.  He has been medically managed since November 2019 with his existing disease, he does have angina with dialysis that is being treated medically.  He has had at least 2 hospitalizations with CHF related to dialysis volume removal issues.  On one occasion he actually had 2 episodes of cardiac arrest back-to-back requiring CPR.  Today he had dialysis start having chest pain near the end of dialysis.  Chest pain continued from about 4:30 p.m. until the time he arrived in the ER at roughly 630.  EMS EKG showed A. fib with rate control, under lying right bundle branch block.  Upon arrival to the ER he was quite somnolent and hypotensive despite having received 450 mL fluid by EMS.  EKG checked in the ER showed slower rate response of A. fib, more broad QRS complex and 1 to 2 mm elevations in III and aVF.  Upon my arrival along with Dr. Paticia Stack (on-call cardiology fellow; practicing cardiologist) the patient was starting to feel better, chest  pain had improved, and blood pressures had improved from the 61W systolic to 431/54 mmHg.  Repeat EKGs showed resolution of inferior ST elevations.  Cardiovascular ROS: positive for - chest pain, dyspnea on exertion and irregular heartbeat negative for - edema, orthopnea, paroxysmal nocturnal dyspnea, rapid heart rate or Syncope/near syncope  Past Medical History:  Diagnosis Date  . Acute CHF ([place]) 01/2018  . Cardiomyopathy secondary    likely related to HTN heart disease; possibly ETOH related as well  . Chronic combined systolic and diastolic heart failure (HCC)    Echocardiogram 09/22/11: Moderate LVH, EF 00-86%, grade 3 diastolic dysfunction, mild MR, moderate to severe LAE, mild RVE, mild to moderate TR, small to moderate pericardial effusion  . Coronary artery disease    not felt to be candidate for CABG 08/2018; medical thearpy recomended due to high risk of PCI   . Dysrhythmia    aflutter 04/2016, afib 02/2018, not felt to be a candidate for anticoagulation due to non-compliance and ETOH  . ESRD (end stage renal disease) on dialysis ([place])    due to hypertensive nephrosclerosis; TTS; [facility] [address] (09/01/2018)  . History of alcohol abuse   . Hypertension   . Myocardial infarction [facility])    " mild " per daughter  . PEA (Pulseless electrical activity) ([place])    PEA arrest 03/09/18, treated empirically for hyperkalemia, shock x1 for WCT,  given amiodarone, ROSC after 10 min of ACLS   Past Surgical History:  Procedure Laterality Date  . AV FISTULA PLACEMENT Left 05/14/2016   Procedure: LEFT ARM BASILIC VEIN TRANSPOSITION;  Surgeon: Rosetta Posner, MD;  Location: [facility];  Service: Vascular;  Laterality: Left;  . INSERTION OF DIALYSIS CATHETER N/A 06/20/2019   Procedure: INSERTION OF TUNNELED  DIALYSIS CATHETER;  Surgeon: Elam Dutch, MD;  Location: [facility];  Service: Vascular;  Laterality: N/A;  . LEFT HEART CATH AND CORONARY ANGIOGRAPHY N/A 09/02/2018   Procedure: LEFT HEART CATH AND CORONARY  ANGIOGRAPHY;  Surgeon: Nelva Bush, MD;  Location: [place] CV LAB;  Service: Cardiovascular;  Laterality: N/A;  . PERIPHERAL VASCULAR CATHETERIZATION N/A 05/13/2016   Procedure: Dialysis/Perma Catheter Insertion;  Surgeon: Serafina Mitchell, MD;  Location: [place] CV LAB;  Service: Cardiovascular;  Laterality: N/A;  . REVISION OF ARTERIOVENOUS GORETEX GRAFT Left 8/46/9629   Procedure: PLICATION OF ARTERIOVENOUS FISTULA LEFT ARM;  Surgeon: Elam Dutch, MD;  Location: [facility] OR;  Service: Vascular;  Laterality: Left;   Social History   Tobacco Use  . Smoking status: Former Smoker    Packs/day: 2.00    Years: 48.00    Pack years: 96.00    Types: Cigars    Quit date: 10/27/2017    Years since quitting: 2.5  . Smokeless tobacco: Former Systems developer    Types: Secondary school teacher  . Vaping Use: Never used  Substance Use Topics  . Alcohol use: Not Currently  . Drug use: No   No current facility-administered medications on file prior to encounter.   Current Outpatient Medications on File Prior to Encounter  Medication Sig Dispense Refill  . atorvastatin (LIPITOR) 80 MG tablet Take 1 tablet (80 mg total) by mouth daily. 90 tablet 3  . clonazePAM (KLONOPIN) 0.5 MG tablet Take by mouth daily.    . clopidogrel (PLAVIX) 75 MG tablet Take 75 mg by mouth every evening.    . cyclobenzaprine (FLEXERIL) 10 MG tablet Take 1 tablet (10 mg total) by mouth 3 (three) times daily as needed for muscle spasms. 60 tablet 0  . diphenhydramine-acetaminophen (TYLENOL PM EXTRA STRENGTH) 25-500 MG TABS tablet Take by mouth as needed.    Marland Kitchen ibuprofen (ADVIL) 800 MG tablet Take by mouth as needed.    . isosorbide mononitrate (IMDUR) 60 MG 24 hr tablet Take 1 tablet by mouth in the evening 90 tablet 0  . lisinopril (ZESTRIL) 5 MG tablet Take 5 mg by mouth at bedtime.    Marland Kitchen LOPERAMIDE HCL PO Take by mouth daily.    . methylPREDNISolone (MEDROL DOSEPAK) 4 MG TBPK tablet As directed 21 tablet 0  . metoprolol tartrate  (LOPRESSOR) 25 MG tablet Take by mouth daily.    . nitroGLYCERIN (NITROSTAT) 0.4 MG SL tablet Place 0.4 mg under the tongue every 5 (five) minutes x 3 doses as needed for chest pain.    Marland Kitchen oxyCODONE (OXY IR/ROXICODONE) 5 MG immediate release tablet Take 1 tablet (5 mg total) by mouth every 6 (six) hours as needed for moderate pain. 12 tablet 0   Family History  Problem Relation Age of Onset  . Emphysema Mother   . Cirrhosis Father    Social History   Tobacco Use  . Smoking status: Former Smoker    Packs/day: 2.00    Years: 48.00    Pack years: 96.00    Types: Cigars    Quit date: 10/27/2017    Years since  quitting: 2.5  . Smokeless tobacco: Former Systems developer    Types: Secondary school teacher  . Vaping Use: Never used  Substance Use Topics  . Alcohol use: Not Currently  . Drug use: No   Social History   Social History Narrative  . Not on file    ROS Review of Systems - Negative except Symptoms noted in HPI-chest pain, nausea, lethargy and fatigue. -With initial level of alertness, he was not able to provide much more history.  BP 128/67 (BP Location: Right Arm)   Pulse 79   Temp 97.6 F (36.4 C) (Rectal)   Resp 15   Ht 5\' 11"  (1.803 m)   Wt 90.6 kg   SpO2 92%   BMI 27.87 kg/m   PE: General appearance: alert, cooperative and Oriented to place, name.  Knows his cardiologist name.  Thinks it is 2000 and something.  Aware of COVID-19.  Says it is Thursday Lungs: Mildly diminished basal breath sounds, nonlabored. Heart: irregularly irregular rhythm, S1, S2 normal and Soft 1/6 SEM at RUSB. Abdomen: soft, non-tender; bowel sounds normal; no masses,  no organomegaly Neurologic: Mental status: alertness: Originally somewhat lethargic, with pressure improvement, more awake and alert, orientation: person, place, city, COVID-19; not aware of date and time or president., affect: normal and Somewhat flat   IMPRESSION / RECOMMENDATIONS After detailed discussion with Dr. Stark Jock and Dr. Paticia Stack,  we felt that with improving symptoms and no longer having ST elevations on his EKG, code STEMI will be canceled and he will be admitted to medicine service with cardiology following.  Would recommend IV heparin, as tolerated and anginal control with low-dose nitroglycerin if blood pressure tolerates.  Emergent cardiac catheterization given his known severe CAD would not likely be helpful, especially the fact that his EKG no longer shows ST elevations.  I suspect that with hypotension he became ischemic.  If he were to still have occlusion of his RCA from inferior elevations, the EKG would not of improved.  Prior catheterization images were reviewed and he has extensive CAD.  Perhaps the focal lesion in the RCA could be treated if worse, but distal left main and LAD/LCx lesions would require high risk likely protected PCI with mechanical support.  This would mean extensive atherectomy from left main into LAD as well as the circumflex with 2 branches.  There was basely be to bifurcation lesions to be stented back to back.  Very complex and difficult case which cannot be treated emergently.  The RCA is very tortuous with a focal lesion but otherwise no significant stenoses.  If he were to have recurrence of ST elevations with ongoing pain, would consider catheterization to exclude occlusion of the RCA.   Please see full cardiology consult note for detailed recommendations.Glenetta Hew, MD

## 2020-05-13 ENCOUNTER — Inpatient Hospital Stay (HOSPITAL_COMMUNITY): Payer: Medicare Other

## 2020-05-13 DIAGNOSIS — I361 Nonrheumatic tricuspid (valve) insufficiency: Secondary | ICD-10-CM

## 2020-05-13 DIAGNOSIS — R7303 Prediabetes: Secondary | ICD-10-CM

## 2020-05-13 DIAGNOSIS — R079 Chest pain, unspecified: Secondary | ICD-10-CM

## 2020-05-13 DIAGNOSIS — I340 Nonrheumatic mitral (valve) insufficiency: Secondary | ICD-10-CM

## 2020-05-13 LAB — BASIC METABOLIC PANEL
Anion gap: 12 (ref 5–15)
BUN: 28 mg/dL — ABNORMAL HIGH (ref 8–23)
CO2: 30 mmol/L (ref 22–32)
Calcium: 8.2 mg/dL — ABNORMAL LOW (ref 8.9–10.3)
Chloride: 93 mmol/L — ABNORMAL LOW (ref 98–111)
Creatinine, Ser: 9.41 mg/dL — ABNORMAL HIGH (ref 0.61–1.24)
GFR calc Af Amer: 6 mL/min — ABNORMAL LOW (ref >60)
GFR calc non Af Amer: 5 mL/min — ABNORMAL LOW (ref >60)
Glucose, Bld: 95 mg/dL (ref 70–99)
Potassium: 4.3 mmol/L (ref 3.5–5.1)
Sodium: 135 mmol/L (ref 135–145)

## 2020-05-13 LAB — CBC
HCT: 31.4 % — ABNORMAL LOW (ref 39.0–52.0)
Hemoglobin: 10.7 g/dL — ABNORMAL LOW (ref 13.0–17.0)
MCH: 32.9 pg (ref 26.0–34.0)
MCHC: 34.1 g/dL (ref 30.0–36.0)
MCV: 96.6 fL (ref 80.0–100.0)
Platelets: 192 10*3/uL (ref 150–400)
RBC: 3.25 MIL/uL — ABNORMAL LOW (ref 4.22–5.81)
RDW: 14.7 % (ref 11.5–15.5)
WBC: 5.6 10*3/uL (ref 4.0–10.5)
nRBC: 0 % (ref 0.0–0.2)

## 2020-05-13 LAB — HEPARIN LEVEL (UNFRACTIONATED)
Heparin Unfractionated: 0.14 IU/mL — ABNORMAL LOW (ref 0.30–0.70)
Heparin Unfractionated: 0.5 IU/mL (ref 0.30–0.70)

## 2020-05-13 LAB — ECHOCARDIOGRAM COMPLETE
Area-P 1/2: 4.21 cm2
Height: 71 in
S' Lateral: 3.6 cm
Weight: 3208 oz

## 2020-05-13 LAB — GLUCOSE, CAPILLARY
Glucose-Capillary: 91 mg/dL (ref 70–99)
Glucose-Capillary: 83 mg/dL (ref 70–99)
Glucose-Capillary: 133 mg/dL — ABNORMAL HIGH (ref 70–99)

## 2020-05-13 LAB — TROPONIN I (HIGH SENSITIVITY)
Troponin I (High Sensitivity): 657 ng/L (ref <18)
Troponin I (High Sensitivity): 11502 ng/L (ref <18)

## 2020-05-13 LAB — MRSA PCR SCREENING: MRSA by PCR: NEGATIVE

## 2020-05-13 MED ORDER — SODIUM CHLORIDE 0.9% FLUSH: 3.0000 mL | INTRAVENOUS | Status: DC | PRN

## 2020-05-13 MED ORDER — SODIUM CHLORIDE 0.9 % IV SOLN: INTRAVENOUS | Status: DC

## 2020-05-13 MED ORDER — MORPHINE SULFATE (PF) 2 MG/ML IV SOLN
1.0000 mg | INTRAVENOUS | Status: DC | PRN
  Administered 2020-05-13: 1 mg via INTRAVENOUS
  Filled 2020-05-13: qty 1

## 2020-05-13 MED ORDER — ASPIRIN 81 MG PO CHEW
81.0000 mg | CHEWABLE_TABLET | ORAL | Status: AC
  Administered 2020-05-14: 81 mg via ORAL
  Filled 2020-05-13: qty 1

## 2020-05-13 MED ORDER — NITROGLYCERIN 0.4 MG SL SUBL
0.4000 mg | SUBLINGUAL_TABLET | SUBLINGUAL | Status: AC | PRN
  Administered 2020-05-13 (×3): 0.4 mg via SUBLINGUAL
  Filled 2020-05-13: qty 1

## 2020-05-13 MED ORDER — SODIUM CHLORIDE 0.9 % IV SOLN: 250.0000 mL | INTRAVENOUS | Status: DC | PRN

## 2020-05-13 MED ORDER — SODIUM CHLORIDE 0.9% FLUSH
3.0000 mL | Freq: Two times a day (BID) | INTRAVENOUS | Status: DC
  Administered 2020-05-15 (×2): 3 mL via INTRAVENOUS

## 2020-05-13 MED ORDER — MORPHINE SULFATE (PF) 2 MG/ML IV SOLN: 1.0000 mg | INTRAVENOUS | Status: DC | PRN

## 2020-05-13 MED ORDER — NITROGLYCERIN 0.4 MG SL SUBL
SUBLINGUAL_TABLET | SUBLINGUAL | Status: AC
  Administered 2020-05-13: 0.4 mg via SUBLINGUAL
  Filled 2020-05-13: qty 3

## 2020-05-13 MED ORDER — CHLORHEXIDINE GLUCONATE CLOTH 2 % EX PADS: 6.0000 | MEDICATED_PAD | Freq: Every day | CUTANEOUS | Status: DC

## 2020-05-13 NOTE — Progress Notes (Signed)
CRITICAL VALUE ALERT  Critical Value:  Troponin 11,502  Date & Time Notied:  7/18 1510hrs.  Provider Notified: PA Eulas Post  Orders Received/Actions taken: No new orders at this time. Patient pain free at this time.

## 2020-05-13 NOTE — H&P (View-Only) (Signed)
Recurrent chest pains this morning and afternoon, did not respond to NG but resolved with low dose IV morphine. Episodes occurred in setting of normal bp's, though some hypotension after SL NG. Given recurrent episodes of chest pains with normal bp's discussed cath with patient, will plan for tomorrow  I have reviewed the risks, indications, and alternatives to cardiac catheterization, possible angioplasty, and stenting with the patient today. Risks include but are not limited to bleeding, infection, vascular injury, stroke, myocardial infection, arrhythmia, kidney injury, radiation-related injury in the case of prolonged fluoroscopy use, emergency cardiac surgery, and death. The patient understands the risks of serious complication is 1-2 in 9323 with diagnostic cardiac cath and 1-2% or less with angioplasty/stenting.    Carlyle Dolly MD

## 2020-05-13 NOTE — Progress Notes (Signed)
Progress Note  Patient Name: Kenneth [name] Date of Encounter: 05/13/2020  [facility] Cardiologist: Peter [name], MD   Subjective   Some recurrent chest pains this AM  Inpatient Medications    Scheduled Meds: . aspirin EC  81 mg Oral Daily  . atorvastatin  80 mg Oral Daily  . clonazePAM  0.5 mg Oral Daily  . clopidogrel  75 mg Oral QPM  . nitroGLYCERIN       Continuous Infusions: . heparin 1,050 Units/hr (05/13/20 0257)   PRN Meds: morphine injection, nitroGLYCERIN, ondansetron **OR** ondansetron (ZOFRAN) IV   Vital Signs    Vitals:   05/13/20 0005 05/13/20 0007 05/13/20 0016 05/13/20 0513  BP: (!) 144/73 138/70 (!) 90/57 (!) 143/70  Pulse:    66  Resp:    16  Temp:    98.4 F (36.9 C)  TempSrc:    Oral  SpO2:    97%  Weight:    90.9 kg  Height:        Intake/Output Summary (Last 24 hours) at 05/13/2020 0850 Last data filed at 05/13/2020 0649 Gross per 24 hour  Intake 98.36 ml  Output --  Net 98.36 ml   Last 3 Weights 05/13/2020 05/12/2020 02/24/2020  Weight (lbs) 200 lb 8 oz 199 lb 12.8 oz 203 lb 9.6 oz  Weight (kg) 90.946 kg 90.629 kg 92.352 kg      Telemetry    SR, short runs NSVT - Personally Reviewed  ECG    n/a - Personally Reviewed  Physical Exam   GEN: No acute distress.   Neck: No JVD Cardiac: RRR, no murmurs, rubs, or gallops.  Respiratory: Clear to auscultation bilaterally. GI: Soft, nontender, non-distended  MS: No edema; No deformity. Neuro:  Nonfocal  Psych: Normal affect   Labs    High Sensitivity Troponin:   Recent Labs  Lab 05/12/20 2015 05/12/20 2304 05/13/20 0034  TROPONINIHS 71* 387* 657*      Chemistry Recent Labs  Lab 05/12/20 2015 05/13/20 0503  NA 137 135  K 3.2* 4.3  CL 93* 93*  CO2 28 30  GLUCOSE 120* 95  BUN 22 28*  CREATININE 8.27* 9.41*  CALCIUM 7.9* 8.2*  PROT 7.3  --   ALBUMIN 3.2*  --   AST 14*  --   ALT 9  --   ALKPHOS 54  --   BILITOT 0.9  --   GFRNONAA 6* 5*  GFRAA 7* 6*   ANIONGAP 16* 12     Hematology Recent Labs  Lab 05/12/20 2015 05/13/20 0503  WBC 7.2 5.6  RBC 3.18* 3.25*  HGB 10.4* 10.7*  HCT 30.2* 31.4*  MCV 95.0 96.6  MCH 32.7 32.9  MCHC 34.4 34.1  RDW 14.6 14.7  PLT 201 192    BNPNo results for input(s): BNP, PROBNP in the last 168 hours.   DDimer No results for input(s): DDIMER in the last 168 hours.   Radiology    DG Chest Port 1 View  Result Date: 05/12/2020 CLINICAL DATA:  CP EXAM: PORTABLE CHEST 1 VIEW COMPARISON:  Chest radiograph 06/20/2019 FINDINGS: Interval removal of a right central venous catheter. Stable cardiomediastinal contours with enlarged heart size. Aortic arch calcification. There is diffuse bilateral coarsening of the interstitium which likely represents chronic bronchitic change. No focal consolidation. No pneumothorax or large pleural effusion. No acute finding in the visualized skeleton. IMPRESSION: Cardiomegaly. Diffuse coarsening of the interstitium likely chronic bronchitic change. Electronically Signed   By: Audie Pinto  M.D.   On: 05/12/2020 19:51    Cardiac Studies    Patient Profile      Kenneth [name] is a 64 year-old male with a history of 3-vessel CAD (medically managed since patient not a candidate for CABG/High risk PCIs), diastolic heart failure, heavy ETOH use, atrial flutter/fibrillation (not an anticoagulation candidate), ESRD on HD (TTS), hypertension, and hyperlipidemia, who presented to the hospital with complaints of substernal chest pain  Assessment & Plan    1. Chest pain - started after dialysis in setting of significant hypotension - initially had inferior ST elevations that resolved with improved bp's. Chest pain also resolved - evaluted by Dr Ellyn Hack interventional cardiology as patient was initially code STEMI. Thought that emegent cath would not be helpful given resolution of ST changes and resolution of symptoms with noramlization of bp's. Likely his known chronic disease in  the settning hypotension led to the transient changes.  Prior caths were reviewed, thought overall very difficult anatomy to consider any form of revasc (see his notes for more detail). Recs were for medical management if recurrent significant symptosm reconsider cath at that time.   - trop 71-->387-->657--> - EKG this AM SR, chronic TWIs - f/u echo, follow trop for peak.   - some recurrent chest pain last night, recurrent chest pain this AM at time of my interview. EKG shows SR and his chronic ST/T changes, no recurrent ST elevation. Of note normal bp's at this time with active chest pain.   - medical therapy with ASA 81, atorva 80, plavix 75, hep gtt - work to relieve symptoms with medical therapy with SL NG and morphine, some low bp's at times after NG so somewhat limited. May have to consider cath today if cannot get him pain free. If can get pain free then likely cath tomorrow given his recurrent pains with normal bp's.    2. Chronic afib - from notes has not been anticoag due to concern about compliance and EtOH use   3. ESRD   For questions or updates, please contact [place] Please consult [email or web address] for contact info under        Signed, Carlyle Dolly, MD  05/13/2020, 8:50 AM

## 2020-05-13 NOTE — Progress Notes (Signed)
Patient stated having 8/10 chest pain, EKG obtained, gave 2 nitros sublingual. Chest pain after medication 3/10. Also lab notified me about critical troponin, notified cardiologist. Will continue to monitor patient.

## 2020-05-13 NOTE — Progress Notes (Signed)
[place] for Heparin Indication: chest pain/ACS  No Known Allergies  Patient Measurements: Height: 5\' 11"  (180.3 cm) Weight: 90.9 kg (200 lb 8 oz) IBW/kg (Calculated) : 75.3 Heparin Dosing Weight: 90.6 kg  Vital Signs: Temp: 98.2 F (36.8 C) (07/18 1206) Temp Source: Oral (07/18 1206) BP: 147/77 (07/18 1313) Pulse Rate: 65 (07/18 1313)  Labs: Recent Labs    05/12/20 2015 05/12/20 2304 05/13/20 0034 05/13/20 0503 05/13/20 1354  HGB 10.4*  --   --  10.7*  --   HCT 30.2*  --   --  31.4*  --   PLT 201  --   --  192  --   APTT 195*  --   --   --   --   LABPROT 14.0  --   --   --   --   INR 1.1  --   --   --   --   HEPARINUNFRC  --   --   --  0.14* 0.50  CREATININE 8.27*  --   --  9.41*  --   TROPONINIHS 71* 387* 657*  --   --     Estimated Creatinine Clearance: 8.9 mL/min (A) (by C-G formula based on SCr of 9.41 mg/dL (H)).   Medical History: Past Medical History:  Diagnosis Date  . Acute CHF ([place]) 01/2018  . Cardiomyopathy secondary    likely related to HTN heart disease; possibly ETOH related as well  . Chronic combined systolic and diastolic heart failure (HCC)    Echocardiogram 09/22/11: Moderate LVH, EF 02-54%, grade 3 diastolic dysfunction, mild MR, moderate to severe LAE, mild RVE, mild to moderate TR, small to moderate pericardial effusion  . Coronary artery disease    not felt to be candidate for CABG 08/2018; medical thearpy recomended due to high risk of PCI   . Dysrhythmia    aflutter 04/2016, afib 02/2018, not felt to be a candidate for anticoagulation due to non-compliance and ETOH  . ESRD (end stage renal disease) on dialysis ([place])    due to hypertensive nephrosclerosis; TTS; [address] (09/01/2018)  . History of alcohol abuse   . Hypertension   . Myocardial infarction [facility])    " mild " per daughter  . PEA (Pulseless electrical activity) ([place])    PEA arrest 03/09/18, treated empirically for hyperkalemia, shock x1  for WCT, given amiodarone, ROSC after 10 min of ACLS    Medications:  Scheduled:  . [START ON 05/14/2020] aspirin  81 mg Oral Pre-Cath  . aspirin EC  81 mg Oral Daily  . atorvastatin  80 mg Oral Daily  . Chlorhexidine Gluconate Cloth  6 each Topical Daily  . clonazePAM  0.5 mg Oral Daily  . clopidogrel  75 mg Oral QPM  . sodium chloride flush  3 mL Intravenous Q12H    Assessment: Patient is a 21 yom that presents to the ED with weakness and chest pain.  Heparin drip started for ACS with plans for cath lab in am Heparin drip 1050 uts/hr HL 0.5 at goal CBC stable no bleeding noted    Goal of Therapy:  Heparin level 0.3-0.7 units/ml Monitor platelets by anticoagulation protocol: Yes   Plan:  Cont heparin at 1050 units/hr  Daily HL, CBC   Bonnita Nasuti Pharm.D. CPP, BCPS Clinical Pharmacist [phone] 05/13/2020 3:13 PM

## 2020-05-13 NOTE — Progress Notes (Signed)
  Echocardiogram 2D Echocardiogram has been performed.  Kenneth [name] 05/13/2020, 12:18 PM

## 2020-05-13 NOTE — Progress Notes (Signed)
Paged by nurse regarding elevated troponin, arrived to see the patient, he is completely chest pain free. EKG showed no ST elevation. Continue observation. Spoke with Dr. [name].

## 2020-05-13 NOTE — Progress Notes (Signed)
CRITICAL VALUE ALERT troponin Critical Value:  387  Date & Time Notied:  05/13/20 0010  Provider Notified: Paticia Stack MD Orders Received/Actions taken: pt currently having chest pain, ekg done, given 2 nitros given- chest pain 3/10

## 2020-05-13 NOTE — Progress Notes (Signed)
Recurrent chest pains this morning and afternoon, did not respond to NG but resolved with low dose IV morphine. Episodes occurred in setting of normal bp's, though some hypotension after SL NG. Given recurrent episodes of chest pains with normal bp's discussed cath with patient, will plan for tomorrow  I have reviewed the risks, indications, and alternatives to cardiac catheterization, possible angioplasty, and stenting with the patient today. Risks include but are not limited to bleeding, infection, vascular injury, stroke, myocardial infection, arrhythmia, kidney injury, radiation-related injury in the case of prolonged fluoroscopy use, emergency cardiac surgery, and death. The patient understands the risks of serious complication is 1-2 in 3710 with diagnostic cardiac cath and 1-2% or less with angioplasty/stenting.    Carlyle Dolly MD

## 2020-05-13 NOTE — Progress Notes (Signed)
PROGRESS NOTE    Kenneth [name]  LEX:[id number] DOB: 07/08/1956 DOA: 05/12/2020 PCP: [name], MD   Brief Narrative:  HPI per Dr. Gean Birchwood on 05/12/20 Kenneth [name] is a 64 y.o. male with history of severe coronary artery disease medically managed not a candidate for intervention, alcohol abuse A. fib not on anticoagulation candidate ESRD on hemodialysis hypertension hyperlipidemia who presents to the ER after patient started developing chest pain while having dialysis.  Pain was retrosternal nonradiating pressure-like associated nausea vomiting.  No fever chills or productive cough.  ED Course: In the ER patient was found to be hypotensive with blood pressure systolic in the 86 was given 500 cc normal saline bolus and Zofran.  Abdomen appears benign on exam chest x-ray unremarkable.  EKG was showing initially ST elevation the inferolateral leads which actually resolved with blood pressure improving.  A code STEMI was called and Dr. Ellyn Hack and Dr. Olena Heckle cardiologist had seen the patient and since patient's dysuria patient has resolved at this time they are planning medical management.  Labs are remarkable for high sensitive troponin of 71 and 387 chest x-ray unremarkable hemoglobin 10.4.  Covid test was negative.  **Interim History  The patient's chest pain resolved.  Troponin started trending up and is now 11,502.  Cardiology is planning on s taking the patient for a cardiac catheterization in a.m.  Assessment & Plan:   Principal Problem:   NSTEMI (non-ST elevated myocardial infarction) ([place]) Active Problems:   Essential hypertension   ESRD on hemodialysis ([place])   Atrial fibrillation and flutter (HCC)   Non-ST elevated myocardial infarction ([place])  Chest Pain/NSTEMI -Initially is not felt to be a CABG candidate back in 2019 and medical therapy was recommended due to his high risk of PCI -initial EKG was showing ST elevation in the inferolateral leads which improved with  blood pressure improvement.  ;  Chest pain is also resolved -Happened after dialysis in the setting of hypotension -Appreciate cardiology consult and recommendation.    Initially was a code STEMI and Dr. Ellyn Hack evaluated but thought emergent cath not be helpful given that he had resolution of his ST changes -Cardiology recommending obtain echo and following troponin for peak -Patient is on heparin drip aspirin 81 mg and Plavix 75.    And atorvastatin 80 mg -Continue symptom relief with sublingual nitroglycerin -Not on beta-blockers due to low normal blood pressure.   -Trend cardiac markers and trended up to 11,502 -Echocardiogram showed 44%, grade 1 diastolic dysfunction, left ventricle has no regional wall motion abnormalities but did show moderate LVH and the right ventricle has normal systolic function -Lipid panel done and showed total cholesterol/HDL level of 4.3, cholesterol level 170, HDL 40, LDL 113, triglycerides of 84, VLDL of 17 -At this time cardiology is planning to manage patient medically but will take him for a Cath in the AM  A. Fib presently rate controlled.   -Not a candidate for Long Term anticoagulation per cardiology -Continue to monitor telemetry  ESRD on hemodialysis on Tuesday Thursdays and Saturday.   -Has had dialysis yesterday and developed CP after -Patient's BUNs/creatinine went from 22/8.61 is now 28/9.41 -We will need a nephrology consultation and dialysis maintenance  Chronic combined systolic and diastolic heart failure/cardiomyopathy likely related to hypertensive heart disease and alcoholism -His echo appears to have improved and shows diastolic dysfunction His EF is 55 to 60% -Volume maintenance per dialysis Frequently monitor and trend  Hypokalemia  -improved and is repleted and  is 4.3 -Continue to monitor and replete as necessary  Anemia likely from renal disease -Patient's hemoglobin/marker went from 10.4/30.2 is now 10.7/31.4 -Continue to  monitor for signs and symptoms of bleeding as patient is anticoagulated heparin drip -Currently no overt bleeding -We will repeat CBC in a.m. and will also check anemia panel in a.m.  Prediabetes -The patient's hemoglobin A1c is 5.7 -Continue monitor blood sugars carefully and if necessary will place on sensitive NovoLog sliding scale insulin AC  History of alcohol abuse  -Continue to monitor for any withdrawal.  DVT prophylaxis: Anticoagulated with Heparin gtt Code Status: FULL CODE Family Communication: No family present at bedside  Disposition Plan: Pending further evaluation and clearance by cardiology  Status is: Inpatient  Remains inpatient appropriate because:Unsafe d/c plan, IV treatments appropriate due to intensity of illness or inability to take PO and Inpatient level of care appropriate due to severity of illness   Dispo: The patient is from: Home              Anticipated d/c is to: TBD              Anticipated d/c date is: 2 days              Patient currently is not medically stable to d/c.  Consultants:   Cardiology   Procedures:  ECHOCARDIOGRAM IMPRESSIONS    1. Left ventricular ejection fraction, by estimation, is 55 to 60%. The  left ventricle has normal function. The left ventricle has no regional  wall motion abnormalities. There is moderate left ventricular hypertrophy.  Left ventricular diastolic  parameters are consistent with Grade I diastolic dysfunction (impaired  relaxation).  2. Right ventricular systolic function is normal. The right ventricular  size is normal. There is normal pulmonary artery systolic pressure.  3. Left atrial size was mildly dilated.  4. The mitral valve is abnormal. Moderate mitral valve regurgitation.  5. The aortic valve is tricuspid. Aortic valve regurgitation is not  visualized. Mild aortic valve sclerosis is present, with no evidence of  aortic valve stenosis.   FINDINGS  Left Ventricle: Left ventricular  ejection fraction, by estimation, is 55  to 60%. The left ventricle has normal function. The left ventricle has no  regional wall motion abnormalities. The left ventricular internal cavity  size was normal in size. There is  moderate left ventricular hypertrophy. Left ventricular diastolic  parameters are consistent with Grade I diastolic dysfunction (impaired  relaxation).   Right Ventricle: The right ventricular size is normal. No increase in  right ventricular wall thickness. Right ventricular systolic function is  normal. There is normal pulmonary artery systolic pressure. The tricuspid  regurgitant velocity is 2.58 m/s, and  with an assumed right atrial pressure of 3 mmHg, the estimated right  ventricular systolic pressure is 76.2 mmHg.   Left Atrium: Left atrial size was mildly dilated.   Right Atrium: Right atrial size was normal in size.   Pericardium: Trivial pericardial effusion is present.   Mitral Valve: The mitral valve is abnormal. There is mild thickening of  the mitral valve leaflet(s). Moderate mitral annular calcification.  Moderate mitral valve regurgitation.   Tricuspid Valve: The tricuspid valve is normal in structure. Tricuspid  valve regurgitation is mild.   Aortic Valve: The aortic valve is tricuspid. Aortic valve regurgitation is  not visualized. Mild aortic valve sclerosis is present, with no evidence  of aortic valve stenosis.   Pulmonic Valve: The pulmonic valve was normal in structure. Pulmonic  valve  regurgitation is not visualized.   Aorta: The aortic root and ascending aorta are structurally normal, with  no evidence of dilitation.   IAS/Shunts: The interatrial septum was not assessed.     LEFT VENTRICLE  PLAX 2D  LVIDd:     5.30 cm   Diastology  LVIDs:     3.60 cm   LV e' lateral:  5.22 cm/s  LV PW:     1.30 cm   LV E/e' lateral: 21.8  LV IVS:    1.20 cm   LV e' medial:  5.44 cm/s  LVOT diam:   2.10 cm    LV E/e' medial: 21.0  LV SV:     58  LV SV Index:  28  LVOT Area:   3.46 cm    LV Volumes (MOD)  LV vol d, MOD A4C: 117.0 ml  LV SV MOD A4C:   117.0 ml   RIGHT VENTRICLE  RV S prime:   13.30 cm/s  TAPSE (M-mode): 2.1 cm   LEFT ATRIUM       Index    RIGHT ATRIUM      Index  LA diam:    4.60 cm 2.18 cm/m RA Area:   15.80 cm  LA Vol (A2C):  92.8 ml 43.96 ml/m RA Volume:  43.80 ml 20.75 ml/m  LA Vol (A4C):  96.5 ml 45.72 ml/m  LA Biplane Vol: 97.9 ml 46.38 ml/m  AORTIC VALVE  LVOT Vmax:  90.50 cm/s  LVOT Vmean: 52.600 cm/s  LVOT VTI:  0.168 m    AORTA  Ao Root diam: 3.10 cm  Ao Asc diam: 3.70 cm   MITRAL VALVE        TRICUSPID VALVE  MV Area (PHT): 4.21 cm   TV Peak grad:  13.7 mmHg  MV Decel Time: 180 msec   TV Vmax:    1.85 m/s  MV E velocity: 114.00 cm/s TR Peak grad:  26.6 mmHg  MV A velocity: 88.80 cm/s  TR Vmax:    258.00 cm/s  MV E/A ratio: 1.28               SHUNTS               Systemic VTI: 0.17 m               Systemic Diam: 2.10 cm   Antimicrobials:  Anti-infectives (From admission, onward)   None     Subjective: Seen and examined at bedside he is a little fatigued.  No nausea or vomiting and states his chest pain resolved.  Felt okay.  No other concerns or complaints at this time and understands that he will be going for cardiac catheterization in a.m.  Objective: Vitals:   05/13/20 1119 05/13/20 1206 05/13/20 1313 05/13/20 1629  BP: (!) 147/78 (!) 164/80 (!) 147/77 (!) 111/52  Pulse: 67 68 65 81  Resp: 18 20    Temp: 97.9 F (36.6 C) 98.2 F (36.8 C)  98.1 F (36.7 C)  TempSrc: Oral Oral  Oral  SpO2: 93% 93% 91% 99%  Weight:      Height:        Intake/Output Summary (Last 24 hours) at 05/13/2020 1817 Last data filed at 05/13/2020 1800 Gross per 24 hour  Intake 453.78 ml  Output --  Net 453.78 ml   Filed Weights    05/12/20 1922 05/13/20 0513  Weight: 90.6 kg 90.9 kg   Examination: Physical Exam:  Constitutional: WN/WD overweight African-American  male currently in NAD and appears calm and comfortable Eyes: Lids and conjunctivae normal, sclerae anicteric  ENMT: External Ears, Nose appear normal. Grossly normal hearing.  Neck: Appears normal, supple, no cervical masses, normal ROM, no appreciable thyromegaly,: No JVD Respiratory: Diminished to auscultation bilaterally, no wheezing, rales, rhonchi or crackles. Normal respiratory effort and patient is not tachypenic. No accessory muscle use.  Cardiovascular: Irregularly irregular, no murmurs / rubs / gallops. S1 and S2 auscultated.  No appreciable extremity edema Abdomen: Soft, non-tender, mildly distended secondary body habitus. Bowel sounds positive.  GU: Deferred. Musculoskeletal: No clubbing / cyanosis of digits/nails. No joint deformity upper and lower extremities.  Has a left arm AV fistula with a good auscultable bruit Skin: No rashes, lesions, ulcers on limited skin evaluation. No induration; Warm and dry.  Neurologic: CN 2-12 grossly intact with no focal deficits. Romberg sign and cerebellar reflexes not assessed.  Psychiatric: Normal judgment and insight. Alert and oriented x 3. Normal mood and appropriate affect.   Data Reviewed: I have personally reviewed following labs and imaging studies  CBC: Recent Labs  Lab 05/12/20 2015 05/13/20 0503  WBC 7.2 5.6  NEUTROABS 4.4  --   HGB 10.4* 10.7*  HCT 30.2* 31.4*  MCV 95.0 96.6  PLT 201 315   Basic Metabolic Panel: Recent Labs  Lab 05/12/20 2015 05/13/20 0503  NA 137 135  K 3.2* 4.3  CL 93* 93*  CO2 28 30  GLUCOSE 120* 95  BUN 22 28*  CREATININE 8.27* 9.41*  CALCIUM 7.9* 8.2*   GFR: Estimated Creatinine Clearance: 8.9 mL/min (A) (by C-G formula based on SCr of 9.41 mg/dL (H)). Liver Function Tests: Recent Labs  Lab 05/12/20 2015  AST 14*  ALT 9  ALKPHOS 54  BILITOT 0.9    PROT 7.3  ALBUMIN 3.2*   No results for input(s): LIPASE, AMYLASE in the last 168 hours. No results for input(s): AMMONIA in the last 168 hours. Coagulation Profile: Recent Labs  Lab 05/12/20 2015  INR 1.1   Cardiac Enzymes: No results for input(s): CKTOTAL, CKMB, CKMBINDEX, TROPONINI in the last 168 hours. BNP (last 3 results) No results for input(s): PROBNP in the last 8760 hours. HbA1C: Recent Labs    05/12/20 2015  HGBA1C 5.7*   CBG: Recent Labs  Lab 05/13/20 0236 05/13/20 0746 05/13/20 1619  GLUCAP 91 83 133*   Lipid Profile: Recent Labs    05/12/20 2015  CHOL 170  HDL 40*  LDLCALC 113*  TRIG 84  CHOLHDL 4.3   Thyroid Function Tests: No results for input(s): TSH, T4TOTAL, FREET4, T3FREE, THYROIDAB in the last 72 hours. Anemia Panel: No results for input(s): VITAMINB12, FOLATE, FERRITIN, TIBC, IRON, RETICCTPCT in the last 72 hours. Sepsis Labs: No results for input(s): PROCALCITON, LATICACIDVEN in the last 168 hours.  Recent Results (from the past 240 hour(s))  SARS Coronavirus 2 by RT PCR (hospital order, performed in [facility] hospital lab) Nasopharyngeal Nasopharyngeal Swab     Status: None   Collection Time: 05/12/20  7:30 PM   Specimen: Nasopharyngeal Swab  Result Value Ref Range Status   SARS Coronavirus 2 NEGATIVE NEGATIVE Final    Comment: (NOTE) SARS-CoV-2 target nucleic acids are NOT DETECTED.  The SARS-CoV-2 RNA is generally detectable in upper and lower respiratory specimens during the acute phase of infection. The lowest concentration of SARS-CoV-2 viral copies this assay can detect is 250 copies / mL. A negative result does not preclude SARS-CoV-2 infection and should not be used  as the sole basis for treatment or other patient management decisions.  A negative result may occur with improper specimen collection / handling, submission of specimen other than nasopharyngeal swab, presence of viral mutation(s) within the areas targeted  by this assay, and inadequate number of viral copies (<250 copies / mL). A negative result must be combined with clinical observations, patient history, and epidemiological information.  Fact Sheet for Patients:   StrictlyIdeas.no  Fact Sheet for Healthcare Providers: BankingDealers.co.za  This test is not yet approved or  cleared by the [place] FDA and has been authorized for detection and/or diagnosis of SARS-CoV-2 by FDA under an Emergency Use Authorization (EUA).  This EUA will remain in effect (meaning this test can be used) for the duration of the COVID-19 declaration under Section 564(b)(1) of the Act, 21 U.S.C. section 360bbb-3(b)(1), unless the authorization is terminated or revoked sooner.  Performed at [facility], [place] [address], [place], [place] [address]   MRSA PCR Screening     Status: None   Collection Time: 05/13/20 12:12 PM   Specimen: Nasal Mucosa; Nasopharyngeal  Result Value Ref Range Status   MRSA by PCR NEGATIVE NEGATIVE Final    Comment:        The GeneXpert MRSA Assay (FDA approved for NASAL specimens only), is one component of a comprehensive MRSA colonization surveillance program. It is not intended to diagnose MRSA infection nor to guide or monitor treatment for MRSA infections. Performed at [facility], [place] [address], [place], [place] [address]      RN Pressure Injury Documentation:     Estimated body mass index is 27.96 kg/m as calculated from the following:   Height as of this encounter: 5\' 11"  (1.803 m).   Weight as of this encounter: 90.9 kg.  Malnutrition Type:      Malnutrition Characteristics:      Nutrition Interventions:     Radiology Studies: DG Chest Port 1 View  Result Date: 05/12/2020 CLINICAL DATA:  CP EXAM: PORTABLE CHEST 1 VIEW COMPARISON:  Chest radiograph 06/20/2019 FINDINGS: Interval removal of a right central venous catheter. Stable  cardiomediastinal contours with enlarged heart size. Aortic arch calcification. There is diffuse bilateral coarsening of the interstitium which likely represents chronic bronchitic change. No focal consolidation. No pneumothorax or large pleural effusion. No acute finding in the visualized skeleton. IMPRESSION: Cardiomegaly. Diffuse coarsening of the interstitium likely chronic bronchitic change. Electronically Signed   By: Audie Pinto M.D.   On: 05/12/2020 19:51   ECHOCARDIOGRAM COMPLETE  Result Date: 05/13/2020    ECHOCARDIOGRAM REPORT   Patient Name:   ASLAN HIMES Date of Exam: 05/13/2020 Medical Rec #:  [id number]      Height:       71.0 in Accession #:    [id number]     Weight:       200.5 lb Date of Birth:  March 30, 1956      BSA:          2.111 m Patient Age:    14 years       BP:           81/69 mmHg Patient Gender: M              HR:           71 bpm. Exam Location:  Inpatient Procedure: 2D Echo Indications:    chest pain 786.50  History:        Patient has prior history of  Echocardiogram examinations, most                 recent 09/18/2018. End stage renal disease. NSTEMI.,                 Arrythmias:Atrial Fibrillation; Risk Factors:Hypertension.  Sonographer:    Johny Chess Referring Phys: [id number] [place]  1. Left ventricular ejection fraction, by estimation, is 55 to 60%. The left ventricle has normal function. The left ventricle has no regional wall motion abnormalities. There is moderate left ventricular hypertrophy. Left ventricular diastolic parameters are consistent with Grade I diastolic dysfunction (impaired relaxation).  2. Right ventricular systolic function is normal. The right ventricular size is normal. There is normal pulmonary artery systolic pressure.  3. Left atrial size was mildly dilated.  4. The mitral valve is abnormal. Moderate mitral valve regurgitation.  5. The aortic valve is tricuspid. Aortic valve regurgitation is not visualized. Mild aortic  valve sclerosis is present, with no evidence of aortic valve stenosis. FINDINGS  Left Ventricle: Left ventricular ejection fraction, by estimation, is 55 to 60%. The left ventricle has normal function. The left ventricle has no regional wall motion abnormalities. The left ventricular internal cavity size was normal in size. There is  moderate left ventricular hypertrophy. Left ventricular diastolic parameters are consistent with Grade I diastolic dysfunction (impaired relaxation). Right Ventricle: The right ventricular size is normal. No increase in right ventricular wall thickness. Right ventricular systolic function is normal. There is normal pulmonary artery systolic pressure. The tricuspid regurgitant velocity is 2.58 m/s, and  with an assumed right atrial pressure of 3 mmHg, the estimated right ventricular systolic pressure is 45.4 mmHg. Left Atrium: Left atrial size was mildly dilated. Right Atrium: Right atrial size was normal in size. Pericardium: Trivial pericardial effusion is present. Mitral Valve: The mitral valve is abnormal. There is mild thickening of the mitral valve leaflet(s). Moderate mitral annular calcification. Moderate mitral valve regurgitation. Tricuspid Valve: The tricuspid valve is normal in structure. Tricuspid valve regurgitation is mild. Aortic Valve: The aortic valve is tricuspid. Aortic valve regurgitation is not visualized. Mild aortic valve sclerosis is present, with no evidence of aortic valve stenosis. Pulmonic Valve: The pulmonic valve was normal in structure. Pulmonic valve regurgitation is not visualized. Aorta: The aortic root and ascending aorta are structurally normal, with no evidence of dilitation. IAS/Shunts: The interatrial septum was not assessed.  LEFT VENTRICLE PLAX 2D LVIDd:         5.30 cm      Diastology LVIDs:         3.60 cm      LV e' lateral:   5.22 cm/s LV PW:         1.30 cm      LV E/e' lateral: 21.8 LV IVS:        1.20 cm      LV e' medial:    5.44 cm/s  LVOT diam:     2.10 cm      LV E/e' medial:  21.0 LV SV:         58 LV SV Index:   28 LVOT Area:     3.46 cm  LV Volumes (MOD) LV vol d, MOD A4C: 117.0 ml LV SV MOD A4C:     117.0 ml RIGHT VENTRICLE RV S prime:     13.30 cm/s TAPSE (M-mode): 2.1 cm LEFT ATRIUM             Index  RIGHT ATRIUM           Index LA diam:        4.60 cm 2.18 cm/m  RA Area:     15.80 cm LA Vol (A2C):   92.8 ml 43.96 ml/m RA Volume:   43.80 ml  20.75 ml/m LA Vol (A4C):   96.5 ml 45.72 ml/m LA Biplane Vol: 97.9 ml 46.38 ml/m  AORTIC VALVE LVOT Vmax:   90.50 cm/s LVOT Vmean:  52.600 cm/s LVOT VTI:    0.168 m  AORTA Ao Root diam: 3.10 cm Ao Asc diam:  3.70 cm MITRAL VALVE                TRICUSPID VALVE MV Area (PHT): 4.21 cm     TV Peak grad:   13.7 mmHg MV Decel Time: 180 msec     TV Vmax:        1.85 m/s MV E velocity: 114.00 cm/s  TR Peak grad:   26.6 mmHg MV A velocity: 88.80 cm/s   TR Vmax:        258.00 cm/s MV E/A ratio:  1.28                             SHUNTS                             Systemic VTI:  0.17 m                             Systemic Diam: 2.10 cm Dorris Carnes MD Electronically signed by Dorris Carnes MD Signature Date/Time: 05/13/2020/2:09:56 PM    Final    Scheduled Meds:  Derrill Memo ON 05/14/2020] aspirin  81 mg Oral Pre-Cath   aspirin EC  81 mg Oral Daily   atorvastatin  80 mg Oral Daily   clonazePAM  0.5 mg Oral Daily   clopidogrel  75 mg Oral QPM   sodium chloride flush  3 mL Intravenous Q12H   Continuous Infusions:  sodium chloride     [START ON 05/14/2020] sodium chloride     heparin 1,050 Units/hr (05/13/20 1800)     LOS: 1 day   Kerney Elbe, DO [facility] PAGER is on AMION  If 7PM-7AM, please contact night-coverage [email or web address]

## 2020-05-13 NOTE — Progress Notes (Signed)
[facility] for Heparin Indication: chest pain/ACS  No Known Allergies  Patient Measurements: Height: 5\' 11"  (180.3 cm) Weight: 90.9 kg (200 lb 8 oz) IBW/kg (Calculated) : 75.3 Heparin Dosing Weight: 90.6 kg  Vital Signs: Temp: 98.4 F (36.9 C) (07/18 0513) Temp Source: Oral (07/18 0513) BP: 143/70 (07/18 0513) Pulse Rate: 66 (07/18 0513)  Labs: Recent Labs    05/12/20 2015 05/12/20 2304 05/13/20 0034 05/13/20 0503  HGB 10.4*  --   --  10.7*  HCT 30.2*  --   --  31.4*  PLT 201  --   --  192  APTT 195*  --   --   --   LABPROT 14.0  --   --   --   INR 1.1  --   --   --   HEPARINUNFRC  --   --   --  0.14*  CREATININE 8.27*  --   --   --   TROPONINIHS 71* 387* 657*  --     Estimated Creatinine Clearance: 10.1 mL/min (A) (by C-G formula based on SCr of 8.27 mg/dL (H)).   Medical History: Past Medical History:  Diagnosis Date  . Acute CHF ([place]) 01/2018  . Cardiomyopathy secondary    likely related to HTN heart disease; possibly ETOH related as well  . Chronic combined systolic and diastolic heart failure (HCC)    Echocardiogram 09/22/11: Moderate LVH, EF 23-55%, grade 3 diastolic dysfunction, mild MR, moderate to severe LAE, mild RVE, mild to moderate TR, small to moderate pericardial effusion  . Coronary artery disease    not felt to be candidate for CABG 08/2018; medical thearpy recomended due to high risk of PCI   . Dysrhythmia    aflutter 04/2016, afib 02/2018, not felt to be a candidate for anticoagulation due to non-compliance and ETOH  . ESRD (end stage renal disease) on dialysis ([place])    due to hypertensive nephrosclerosis; TTS; [address] (09/01/2018)  . History of alcohol abuse   . Hypertension   . Myocardial infarction [facility])    " mild " per daughter  . PEA (Pulseless electrical activity) ([place])    PEA arrest 03/09/18, treated empirically for hyperkalemia, shock x1 for WCT, given amiodarone, ROSC after 10 min of ACLS     Medications:  Scheduled:  . aspirin EC  81 mg Oral Daily  . atorvastatin  80 mg Oral Daily  . clonazePAM  0.5 mg Oral Daily  . clopidogrel  75 mg Oral QPM  . nitroGLYCERIN        Assessment: Patient is a 31 yom that presents to the ED with weakness and chest pain. The patient had a concerning looking EKG for a STEMI. The patient has been see for this before with discussion regarding the risk for intervention. At this time pharmacy has been asked to dose heparin for ACS.  7/18 AM update: Heparin level is low, however, heparin was off for about 30 minutes a few hours ago  Goal of Therapy:  Heparin level 0.3-0.7 units/ml Monitor platelets by anticoagulation protocol: Yes   Plan:  Cont heparin at 1050 units/hr given recent interruption in therapy Re-check heparin level at [place], PharmD, [place] Pharmacist Phone: [phone]

## 2020-05-14 ENCOUNTER — Encounter (HOSPITAL_COMMUNITY)
Admission: EM | Disposition: A | Discharge status: Home / Self Care | Payer: Self-pay | Source: Home / Self Care | Attending: Internal Medicine

## 2020-05-14 DIAGNOSIS — I4891 Unspecified atrial fibrillation: Secondary | ICD-10-CM

## 2020-05-14 DIAGNOSIS — I214 Non-ST elevation (NSTEMI) myocardial infarction: Principal | ICD-10-CM

## 2020-05-14 DIAGNOSIS — I2510 Atherosclerotic heart disease of native coronary artery without angina pectoris: Secondary | ICD-10-CM

## 2020-05-14 DIAGNOSIS — I4892 Unspecified atrial flutter: Secondary | ICD-10-CM

## 2020-05-14 HISTORY — PX: LEFT HEART CATH AND CORONARY ANGIOGRAPHY: CATH118249

## 2020-05-14 LAB — CBC WITH DIFFERENTIAL/PLATELET
Abs Immature Granulocytes: 0.02 10*3/uL (ref 0.00–0.07)
Basophils Absolute: 0.1 10*3/uL (ref 0.0–0.1)
Basophils Relative: 1 %
Eosinophils Absolute: 0.8 10*3/uL — ABNORMAL HIGH (ref 0.0–0.5)
Eosinophils Relative: 13 %
HCT: 30 % — ABNORMAL LOW (ref 39.0–52.0)
Hemoglobin: 10.1 g/dL — ABNORMAL LOW (ref 13.0–17.0)
Immature Granulocytes: 0 %
Lymphocytes Relative: 32 %
Lymphs Abs: 1.9 10*3/uL (ref 0.7–4.0)
MCH: 32.7 pg (ref 26.0–34.0)
MCHC: 33.7 g/dL (ref 30.0–36.0)
MCV: 97.1 fL (ref 80.0–100.0)
Monocytes Absolute: 0.9 10*3/uL (ref 0.1–1.0)
Monocytes Relative: 16 %
Neutro Abs: 2.2 10*3/uL (ref 1.7–7.7)
Neutrophils Relative %: 38 %
Platelets: 187 10*3/uL (ref 150–400)
RBC: 3.09 MIL/uL — ABNORMAL LOW (ref 4.22–5.81)
RDW: 15 % (ref 11.5–15.5)
WBC: 5.8 10*3/uL (ref 4.0–10.5)
nRBC: 0 % (ref 0.0–0.2)

## 2020-05-14 LAB — COMPREHENSIVE METABOLIC PANEL
ALT: 11 U/L (ref 0–44)
AST: 35 U/L (ref 15–41)
Albumin: 2.8 g/dL — ABNORMAL LOW (ref 3.5–5.0)
Alkaline Phosphatase: 52 U/L (ref 38–126)
Anion gap: 12 (ref 5–15)
BUN: 37 mg/dL — ABNORMAL HIGH (ref 8–23)
CO2: 30 mmol/L (ref 22–32)
Calcium: 8.2 mg/dL — ABNORMAL LOW (ref 8.9–10.3)
Chloride: 96 mmol/L — ABNORMAL LOW (ref 98–111)
Creatinine, Ser: 12.53 mg/dL — ABNORMAL HIGH (ref 0.61–1.24)
GFR calc Af Amer: 4 mL/min — ABNORMAL LOW (ref >60)
GFR calc non Af Amer: 4 mL/min — ABNORMAL LOW (ref >60)
Glucose, Bld: 89 mg/dL (ref 70–99)
Potassium: 4.1 mmol/L (ref 3.5–5.1)
Sodium: 138 mmol/L (ref 135–145)
Total Bilirubin: 1 mg/dL (ref 0.3–1.2)
Total Protein: 6.9 g/dL (ref 6.5–8.1)

## 2020-05-14 LAB — MAGNESIUM: Magnesium: 1.8 mg/dL (ref 1.7–2.4)

## 2020-05-14 LAB — GLUCOSE, CAPILLARY
Glucose-Capillary: 102 mg/dL — ABNORMAL HIGH (ref 70–99)
Glucose-Capillary: 81 mg/dL (ref 70–99)
Glucose-Capillary: 89 mg/dL (ref 70–99)

## 2020-05-14 LAB — PHOSPHORUS: Phosphorus: 6.7 mg/dL — ABNORMAL HIGH (ref 2.5–4.6)

## 2020-05-14 LAB — HEPARIN LEVEL (UNFRACTIONATED): Heparin Unfractionated: 0.3 IU/mL (ref 0.30–0.70)

## 2020-05-14 SURGERY — LEFT HEART CATH AND CORONARY ANGIOGRAPHY
Anesthesia: LOCAL

## 2020-05-14 MED ORDER — SODIUM CHLORIDE 0.9% FLUSH: 3.0000 mL | INTRAVENOUS | Status: DC | PRN

## 2020-05-14 MED ORDER — SODIUM CHLORIDE 0.9 % IV SOLN: 250.0000 mL | INTRAVENOUS | Status: DC | PRN

## 2020-05-14 MED ORDER — CALCITRIOL 0.5 MCG PO CAPS
2.5000 ug | ORAL_CAPSULE | ORAL | Status: DC
  Administered 2020-05-15: 2.5 ug via ORAL
  Filled 2020-05-14: qty 5

## 2020-05-14 MED ORDER — LIDOCAINE HCL (PF) 1 % IJ SOLN
INTRAMUSCULAR | Status: DC | PRN
  Administered 2020-05-14: 20 mL via INTRADERMAL

## 2020-05-14 MED ORDER — SODIUM CHLORIDE 0.9% FLUSH
3.0000 mL | Freq: Two times a day (BID) | INTRAVENOUS | Status: DC
  Administered 2020-05-14 – 2020-05-15 (×3): 3 mL via INTRAVENOUS

## 2020-05-14 MED ORDER — MIDAZOLAM HCL 2 MG/2ML IJ SOLN
INTRAMUSCULAR | Status: DC | PRN
  Administered 2020-05-14: 1 mg via INTRAVENOUS

## 2020-05-14 MED ORDER — CINACALCET HCL 30 MG PO TABS
90.0000 mg | ORAL_TABLET | ORAL | Status: DC
  Administered 2020-05-15: 90 mg via ORAL
  Filled 2020-05-14: qty 3

## 2020-05-14 MED ORDER — FENTANYL CITRATE (PF) 100 MCG/2ML IJ SOLN
INTRAMUSCULAR | Status: AC
  Filled 2020-05-14: qty 2

## 2020-05-14 MED ORDER — CHLORHEXIDINE GLUCONATE CLOTH 2 % EX PADS
6.0000 | MEDICATED_PAD | Freq: Every day | CUTANEOUS | Status: DC
  Administered 2020-05-16: 6 via TOPICAL

## 2020-05-14 MED ORDER — MIDAZOLAM HCL 2 MG/2ML IJ SOLN
INTRAMUSCULAR | Status: AC
  Filled 2020-05-14: qty 2

## 2020-05-14 MED ORDER — HYDRALAZINE HCL 20 MG/ML IJ SOLN: 10.0000 mg | INTRAMUSCULAR | Status: AC | PRN

## 2020-05-14 MED ORDER — ACETAMINOPHEN 325 MG PO TABS
650.0000 mg | ORAL_TABLET | ORAL | Status: DC | PRN
  Administered 2020-05-15: 650 mg via ORAL
  Filled 2020-05-14: qty 2

## 2020-05-14 MED ORDER — LABETALOL HCL 5 MG/ML IV SOLN: 10.0000 mg | INTRAVENOUS | Status: AC | PRN

## 2020-05-14 MED ORDER — RENA-VITE PO TABS
1.0000 | ORAL_TABLET | Freq: Every day | ORAL | Status: DC
  Administered 2020-05-14 – 2020-05-15 (×2): 1 via ORAL
  Filled 2020-05-14 (×2): qty 1

## 2020-05-14 MED ORDER — LIDOCAINE HCL (PF) 1 % IJ SOLN
INTRAMUSCULAR | Status: AC
  Filled 2020-05-14: qty 30

## 2020-05-14 MED ORDER — ISOSORBIDE MONONITRATE ER 60 MG PO TB24
60.0000 mg | ORAL_TABLET | Freq: Every day | ORAL | Status: DC
  Administered 2020-05-14 – 2020-05-16 (×3): 60 mg via ORAL
  Filled 2020-05-14 (×3): qty 1

## 2020-05-14 MED ORDER — FENTANYL CITRATE (PF) 100 MCG/2ML IJ SOLN
INTRAMUSCULAR | Status: DC | PRN
  Administered 2020-05-14: 25 ug via INTRAVENOUS

## 2020-05-14 MED ORDER — HEPARIN (PORCINE) IN NACL 1000-0.9 UT/500ML-% IV SOLN
INTRAVENOUS | Status: AC
  Filled 2020-05-14: qty 1000

## 2020-05-14 MED ORDER — LANTHANUM CARBONATE 500 MG PO CHEW
2000.0000 mg | CHEWABLE_TABLET | Freq: Three times a day (TID) | ORAL | Status: DC
  Administered 2020-05-14 – 2020-05-16 (×5): 2000 mg via ORAL
  Filled 2020-05-14 (×5): qty 4

## 2020-05-14 MED ORDER — HEPARIN (PORCINE) IN NACL 1000-0.9 UT/500ML-% IV SOLN
INTRAVENOUS | Status: DC | PRN
  Administered 2020-05-14 (×2): 500 mL

## 2020-05-14 SURGICAL SUPPLY — 9 items
CATH INFINITI 5FR MULTPACK ANG (CATHETERS) ×1 IMPLANT
CLOSURE MYNX CONTROL 5F (Vascular Products) ×1 IMPLANT
KIT HEART LEFT (KITS) ×2 IMPLANT
PACK CARDIAC CATHETERIZATION (CUSTOM PROCEDURE TRAY) ×2 IMPLANT
SHEATH PINNACLE 5F 10CM (SHEATH) ×1 IMPLANT
SYR MEDRAD MARK 7 150ML (SYRINGE) ×2 IMPLANT
TRANSDUCER W/STOPCOCK (MISCELLANEOUS) ×2 IMPLANT
TUBING CIL FLEX 10 FLL-RA (TUBING) ×2 IMPLANT
WIRE EMERALD 3MM-J .035X150CM (WIRE) ×1 IMPLANT

## 2020-05-14 NOTE — Consult Note (Signed)
[facility] Renal Consultation Note    Indication for Consultation:  Management of ESRD/hemodialysis; anemia, hypertension/volume and secondary hyperparathyroidism PCP:  HPI: Kenneth [name] is a 64 y.o. male with ESRD on TTS dialysis at [facility] with hx combined CHF, NICM, [place] abuse, atrial fib/flutter , prior PEA arrest, STEMI - noncandidate for PCI or 3 vessel CABG previously presented last Saturday after dialysis with chest pain that started while he was coming home on the [place].  After he got home Saturday, he ate some cheese nachos and the CP worsened so he called EMS.  He notes he had been having CP when he drinks water and eats food at times. Marland Kitchen  He completed a full dialysis.  BP was high pre and post sitting 180/100s and post standing BP 148/104  with net UF of 2.8 L and post wt 90.2 (EDW 87.5.  Pre weight Saturday showed a 5.4 kg weight gain between Thursday and Saturday.  He is able to get to EDW or even below when IDWG are not so high.   When evaluated in the ED Saturday, BP was low with SBP 86 and he was given 500 cc saline and zofran for chest pain related N/V.  EKG showed initial ST elevation in the inferolateral leads which resolved as BP improved. Code stemi was called. Trop increased from 71 > 387. Hgb in the 10s below prior outpatient hgb levels. Labs today are remarkable for K 4.1 Ca 8.2 P 6.7 hgb 10.1 CXR on admission showed CM and diffuse bronchitic changes.  Past Medical History:  Diagnosis Date  . Acute CHF ([place]) 01/2018  . Cardiomyopathy secondary    likely related to HTN heart disease; possibly ETOH related as well  . Chronic combined systolic and diastolic heart failure (HCC)    Echocardiogram 09/22/11: Moderate LVH, EF 03-00%, grade 3 diastolic dysfunction, mild MR, moderate to severe LAE, mild RVE, mild to moderate TR, small to moderate pericardial effusion  . Coronary artery disease    not felt to be candidate for CABG 08/2018; medical thearpy recomended due to  high risk of PCI   . Dysrhythmia    aflutter 04/2016, afib 02/2018, not felt to be a candidate for anticoagulation due to non-compliance and ETOH  . ESRD (end stage renal disease) on dialysis ([place])    due to hypertensive nephrosclerosis; TTS; [address] (09/01/2018)  . History of alcohol abuse   . Hypertension   . Myocardial infarction [facility])    " mild " per daughter  . PEA (Pulseless electrical activity) ([place])    PEA arrest 03/09/18, treated empirically for hyperkalemia, shock x1 for WCT, given amiodarone, ROSC after 10 min of ACLS   Past Surgical History:  Procedure Laterality Date  . AV FISTULA PLACEMENT Left 05/14/2016   Procedure: LEFT ARM BASILIC VEIN TRANSPOSITION;  Surgeon: Rosetta Posner, MD;  Location: [place];  Service: Vascular;  Laterality: Left;  . INSERTION OF DIALYSIS CATHETER N/A 06/20/2019   Procedure: INSERTION OF TUNNELED  DIALYSIS CATHETER;  Surgeon: Elam Dutch, MD;  Location: [place];  Service: Vascular;  Laterality: N/A;  . LEFT HEART CATH AND CORONARY ANGIOGRAPHY N/A 09/02/2018   Procedure: LEFT HEART CATH AND CORONARY ANGIOGRAPHY;  Surgeon: Nelva Bush, MD;  Location: [place] CV LAB;  Service: Cardiovascular;  Laterality: N/A;  . PERIPHERAL VASCULAR CATHETERIZATION N/A 05/13/2016   Procedure: Dialysis/Perma Catheter Insertion;  Surgeon: Serafina Mitchell, MD;  Location: [place] CV [facility];  Service: Cardiovascular;  Laterality: N/A;  .  REVISION OF ARTERIOVENOUS GORETEX GRAFT Left 8/36/6294   Procedure: PLICATION OF ARTERIOVENOUS FISTULA LEFT ARM;  Surgeon: Elam Dutch, MD;  Location: [facility] OR;  Service: Vascular;  Laterality: Left;   Family History  Problem Relation Age of Onset  . Emphysema Mother   . Cirrhosis Father    Social History:  reports that he quit smoking about 2 years ago. His smoking use included cigars. He has a 96.00 pack-year smoking history. He has quit using smokeless tobacco.  His smokeless tobacco use included chew. He reports previous  alcohol use. He reports that he does not use drugs. No Known Allergies Prior to Admission medications   Medication Sig Start Date End Date Taking? Authorizing Provider  atorvastatin (LIPITOR) 80 MG tablet Take 1 tablet (80 mg total) by mouth daily. 03/16/20  Yes [name], [name], MD  clonazePAM (KLONOPIN) 0.5 MG tablet Take 0.5 mg by mouth daily.  02/11/18  Yes [provider]  clopidogrel (PLAVIX) 75 MG tablet Take 75 mg by mouth every evening.   Yes [provider]  diphenhydramine-acetaminophen (TYLENOL PM EXTRA STRENGTH) 25-500 MG TABS tablet Take 2 tablets by mouth at bedtime as needed (Pain, Sleep).  05/12/18  Yes [provider]  ibuprofen (ADVIL) 800 MG tablet Take 1,600 mg by mouth daily as needed for fever or moderate pain.  05/21/17  Yes [provider]  isosorbide mononitrate (IMDUR) 60 MG 24 hr tablet Take 1 tablet by mouth in the evening Patient taking differently: Take 60 mg by mouth daily.  01/26/20  Yes [name], [name], MD  cyclobenzaprine (FLEXERIL) 10 MG tablet Take 1 tablet (10 mg total) by mouth 3 (three) times daily as needed for muscle spasms. Patient not taking: Reported on 05/12/2020 02/24/20   Laurey Morale, MD  methylPREDNISolone (MEDROL DOSEPAK) 4 MG TBPK tablet As directed Patient not taking: Reported on 05/12/2020 02/24/20   Laurey Morale, MD  nitroGLYCERIN (NITROSTAT) 0.4 MG SL tablet Place 0.4 mg under the tongue every 5 (five) minutes x 3 doses as needed for chest pain.    [provider]  oxyCODONE (OXY IR/ROXICODONE) 5 MG immediate release tablet Take 1 tablet (5 mg total) by mouth every 6 (six) hours as needed for moderate pain. Patient not taking: Reported on 05/12/2020 06/21/19   Ulyses Amor, PA-C   Current Facility-Administered Medications  Medication Dose Route Frequency Provider Last Rate Last Admin  . 0.9 %  sodium chloride infusion  250 mL Intravenous PRN Sherren Mocha, MD      . acetaminophen (TYLENOL) tablet  650 mg  650 mg Oral Q4H PRN Sherren Mocha, MD      . aspirin EC tablet 81 mg  81 mg Oral Daily Sherren Mocha, MD   81 mg at 05/13/20 0912  . atorvastatin (LIPITOR) tablet 80 mg  80 mg Oral Daily Sherren Mocha, MD   80 mg at 05/14/20 0955  . clonazePAM (KLONOPIN) tablet 0.5 mg  0.5 mg Oral Daily Sherren Mocha, MD   0.5 mg at 05/14/20 0955  . clopidogrel (PLAVIX) tablet 75 mg  75 mg Oral QPM Sherren Mocha, MD   75 mg at 05/13/20 1717  . hydrALAZINE (APRESOLINE) injection 10 mg  10 mg Intravenous Q20 Min PRN Sherren Mocha, MD      . isosorbide mononitrate (IMDUR) 24 hr tablet 60 mg  60 mg Oral Daily Sherren Mocha, MD   60 mg at 05/14/20 0955  . labetalol (NORMODYNE) injection 10 mg  10 mg  Intravenous Q10 min PRN Sherren Mocha, MD      . morphine 2 MG/ML injection 1 mg  1 mg Intravenous Q4H PRN Sherren Mocha, MD   1 mg at 05/13/20 0940  . ondansetron (ZOFRAN) tablet 4 mg  4 mg Oral Q6H PRN Sherren Mocha, MD       Or  . ondansetron [facility]) injection 4 mg  4 mg Intravenous Q6H PRN Sherren Mocha, MD      . sodium chloride flush (NS) 0.9 % injection 3 mL  3 mL Intravenous Q12H Sherren Mocha, MD      . sodium chloride flush (NS) 0.9 % injection 3 mL  3 mL Intravenous Q12H Sherren Mocha, MD      . sodium chloride flush (NS) 0.9 % injection 3 mL  3 mL Intravenous PRN Sherren Mocha, MD       Labs: Basic Metabolic Panel: Recent Labs  Lab 05/12/20 2015 05/13/20 0503 05/14/20 0518  NA 137 135 138  K 3.2* 4.3 4.1  CL 93* 93* 96*  CO2 28 30 30   GLUCOSE 120* 95 89  BUN 22 28* 37*  CREATININE 8.27* 9.41* 12.53*  CALCIUM 7.9* 8.2* 8.2*  PHOS  --   --  6.7*   Liver Function Tests: Recent Labs  Lab 05/12/20 2015 05/14/20 0518  AST 14* 35  ALT 9 11  ALKPHOS 54 52  BILITOT 0.9 1.0  PROT 7.3 6.9  ALBUMIN 3.2* 2.8*   No results for input(s): LIPASE, AMYLASE in the last 168 hours. No results for input(s): AMMONIA in the last 168 hours. CBC: Recent Labs  Lab  05/12/20 2015 05/13/20 0503 05/14/20 0518  WBC 7.2 5.6 5.8  NEUTROABS 4.4  --  2.2  HGB 10.4* 10.7* 10.1*  HCT 30.2* 31.4* 30.0*  MCV 95.0 96.6 97.1  PLT 201 192 187   Cardiac Enzymes: No results for input(s): CKTOTAL, CKMB, CKMBINDEX, TROPONINI in the last 168 hours. CBG: Recent Labs  Lab 05/13/20 0236 05/13/20 0746 05/13/20 1619 05/14/20 0020 05/14/20 0828  GLUCAP 91 83 133* 81 89   Iron Studies: No results for input(s): IRON, TIBC, TRANSFERRIN, FERRITIN in the last 72 hours. Studies/Results: CARDIAC CATHETERIZATION  Result Date: 05/14/2020 1.  Severe multivessel coronary artery disease with severe calcific distal left main stenosis, severe diffuse ostial/proximal LAD stenosis, severe stenosis of the proximal left circumflex/first OM, and interval occlusion of the distal RCA with left-to-right collaterals 2.  Heavily calcified distal abdominal aorta and bilateral iliac arteries with mild diffuse calcified disease.  No severe iliac lesions identified. Recommend ongoing medical therapy.  With total occlusion of the RCA and severe calcification of the distal left main and complex disease in the LAD and left circumflex, I think risk of protected PCI would be extremely high.  I also think his access is marginal for large bore support devices from the groin.  DG Chest Port 1 View  Result Date: 05/12/2020 CLINICAL DATA:  CP EXAM: PORTABLE CHEST 1 VIEW COMPARISON:  Chest radiograph 06/20/2019 FINDINGS: Interval removal of a right central venous catheter. Stable cardiomediastinal contours with enlarged heart size. Aortic arch calcification. There is diffuse bilateral coarsening of the interstitium which likely represents chronic bronchitic change. No focal consolidation. No pneumothorax or large pleural effusion. No acute finding in the visualized skeleton. IMPRESSION: Cardiomegaly. Diffuse coarsening of the interstitium likely chronic bronchitic change. Electronically Signed   By: Audie Pinto M.D.   On: 05/12/2020 19:51   ECHOCARDIOGRAM COMPLETE  Result Date: 05/13/2020  ECHOCARDIOGRAM REPORT   Patient Name:   STANLEE ROEHRIG Date of Exam: 05/13/2020 Medical Rec #:  [id number]      Height:       71.0 in Accession #:    [id number]     Weight:       200.5 lb Date of Birth:  [birth year]      BSA:          2.111 m Patient Age:    56 years       BP:           81/69 mmHg Patient Gender: M              HR:           71 bpm. Exam Location:  Inpatient Procedure: 2D Echo Indications:    chest pain 786.50  History:        Patient has prior history of Echocardiogram examinations, most                 recent 09/18/2018. End stage renal disease. NSTEMI.,                 Arrythmias:Atrial Fibrillation; Risk Factors:Hypertension.  Sonographer:    Johny Chess Referring Phys: [id number] [place]  1. Left ventricular ejection fraction, by estimation, is 55 to 60%. The left ventricle has normal function. The left ventricle has no regional wall motion abnormalities. There is moderate left ventricular hypertrophy. Left ventricular diastolic parameters are consistent with Grade I diastolic dysfunction (impaired relaxation).  2. Right ventricular systolic function is normal. The right ventricular size is normal. There is normal pulmonary artery systolic pressure.  3. Left atrial size was mildly dilated.  4. The mitral valve is abnormal. Moderate mitral valve regurgitation.  5. The aortic valve is tricuspid. Aortic valve regurgitation is not visualized. Mild aortic valve sclerosis is present, with no evidence of aortic valve stenosis. FINDINGS  Left Ventricle: Left ventricular ejection fraction, by estimation, is 55 to 60%. The left ventricle has normal function. The left ventricle has no regional wall motion abnormalities. The left ventricular internal cavity size was normal in size. There is  moderate left ventricular hypertrophy. Left ventricular diastolic parameters are consistent with  Grade I diastolic dysfunction (impaired relaxation). Right Ventricle: The right ventricular size is normal. No increase in right ventricular wall thickness. Right ventricular systolic function is normal. There is normal pulmonary artery systolic pressure. The tricuspid regurgitant velocity is 2.58 m/s, and  with an assumed right atrial pressure of 3 mmHg, the estimated right ventricular systolic pressure is 76.5 mmHg. Left Atrium: Left atrial size was mildly dilated. Right Atrium: Right atrial size was normal in size. Pericardium: Trivial pericardial effusion is present. Mitral Valve: The mitral valve is abnormal. There is mild thickening of the mitral valve leaflet(s). Moderate mitral annular calcification. Moderate mitral valve regurgitation. Tricuspid Valve: The tricuspid valve is normal in structure. Tricuspid valve regurgitation is mild. Aortic Valve: The aortic valve is tricuspid. Aortic valve regurgitation is not visualized. Mild aortic valve sclerosis is present, with no evidence of aortic valve stenosis. Pulmonic Valve: The pulmonic valve was normal in structure. Pulmonic valve regurgitation is not visualized. Aorta: The aortic root and ascending aorta are structurally normal, with no evidence of dilitation. IAS/Shunts: The interatrial septum was not assessed.  LEFT VENTRICLE PLAX 2D LVIDd:         5.30 cm      Diastology LVIDs:  3.60 cm      LV e' lateral:   5.22 cm/s LV PW:         1.30 cm      LV E/e' lateral: 21.8 LV IVS:        1.20 cm      LV e' medial:    5.44 cm/s LVOT diam:     2.10 cm      LV E/e' medial:  21.0 LV SV:         58 LV SV Index:   28 LVOT Area:     3.46 cm  LV Volumes (MOD) LV vol d, MOD A4C: 117.0 ml LV SV MOD A4C:     117.0 ml RIGHT VENTRICLE RV S prime:     13.30 cm/s TAPSE (M-mode): 2.1 cm LEFT ATRIUM             Index       RIGHT ATRIUM           Index LA diam:        4.60 cm 2.18 cm/m  RA Area:     15.80 cm LA Vol (A2C):   92.8 ml 43.96 ml/m RA Volume:   43.80 ml   20.75 ml/m LA Vol (A4C):   96.5 ml 45.72 ml/m LA Biplane Vol: 97.9 ml 46.38 ml/m  AORTIC VALVE LVOT Vmax:   90.50 cm/s LVOT Vmean:  52.600 cm/s LVOT VTI:    0.168 m  AORTA Ao Root diam: 3.10 cm Ao Asc diam:  3.70 cm MITRAL VALVE                TRICUSPID VALVE MV Area (PHT): 4.21 cm     TV Peak grad:   13.7 mmHg MV Decel Time: 180 msec     TV Vmax:        1.85 m/s MV E velocity: 114.00 cm/s  TR Peak grad:   26.6 mmHg MV A velocity: 88.80 cm/s   TR Vmax:        258.00 cm/s MV E/A ratio:  1.28                             SHUNTS                             Systemic VTI:  0.17 m                             Systemic Diam: 2.10 cm Dorris Carnes MD Electronically signed by Dorris Carnes MD Signature Date/Time: 05/13/2020/2:09:56 PM    Final     ROS: As per HPI otherwise negative.    Physical Exam: Vitals:   05/14/20 1454 05/14/20 1459 05/14/20 1504 05/14/20 1515  BP: (!) 142/78 (!) 158/91 (!) 143/95 (!) 159/95  Pulse: 76 71 96 72  Resp: 19 (!) 29 20   Temp:      TempSrc:      SpO2: 100% 99% 100% 99%  Weight:      Height:         General: WDWN NAD Head: NCAT sclera not icteric MMM Neck: Supple.  Lungs: CTA bilaterally without wheezes, rales, or rhonchi. Breathing is unlabored. Heart: RRR with S1 S2.  Abdomen: obese soft NT + BS Lower extremities:without edema or ischemic changes, no open wounds  Neuro: A & O  X 3.  Moves all extremities spontaneously. Psych:  Responds to questions appropriately with a normal affect. Dialysis Access:left upper AVF + bruit  Dialysis Orders:  [facility] TTS 4.25 hr 400/800 2 K 2 Ca EDW 87.5 36 degrees left upper AVF heparin 2400 Recent labs: hgb 11.4 Ca 8.,1 P 6.2 iPTH 539  Calcitriol 2.5 sensipar 90  Last mircera 60 03/29/20  Assessment/Plan: 1. NSTEMI - s/p cath today - severe multivessel CAD with heavily calcified abdominal aortal and bilateral iliac arteries with mild diffuse calcified diseases - rec ongoing medical tx per Dr. Burt Knack 2. ESRD -  TTS - HD Tuesday with  usual orders 3. Hypertension/volume  - high IDWG when he presented to dialysis Saturday could have precipitated this - Able to get ot EDW when goals are reasonable - get standing wt - UF to EDW with max net UF 3.5; needs updated med list at d/c at his dialysis unit. Our med list there would indicate he is on MTP, norvasc and lisinopril but BP is high suggesting that he wasn't taking them -  4. Anemia  - hgb down from last labs - not on ESA yet - would resume at lowest ESA dose if hgb drops further 5. Metabolic bone disease -  Continue calcitriol/sensipar - resume fosrenol 2 ac 6. Nutrition - change to renal diet/vits 7. Hx afib/flutter - no anticoag due to compliance with med issues - in NSR now 8. ETOH abuse 9. Hyperlipidemia - on statin  Myriam Jacobson, PA-C [place] [phone] 05/14/2020, 4:00 PM

## 2020-05-14 NOTE — Progress Notes (Signed)
PT Cancellation Note  Patient Details Name: Kenneth [name] MRN: [id number] DOB: 1956/06/29   Cancelled Treatment:    Reason Eval/Treat Not Completed: Medical issues which prohibited therapy Pt currently with elevated troponins and scheduled for cath. Will follow up as pt medically appropriate.   Lou Miner, DPT  Acute Rehabilitation Services  Pager: [phone] Office: [phone]    Rudean Hitt 05/14/2020, 1:47 PM

## 2020-05-14 NOTE — Progress Notes (Addendum)
Progress Note  Patient Name: Kenneth [name] Date of Encounter: 05/14/2020  [facility] Cardiologist: Peter [name], MD   Subjective   No chest pain no SOB, flat in bed  Inpatient Medications    Scheduled Meds: . aspirin EC  81 mg Oral Daily  . atorvastatin  80 mg Oral Daily  . clonazePAM  0.5 mg Oral Daily  . clopidogrel  75 mg Oral QPM  . sodium chloride flush  3 mL Intravenous Q12H   Continuous Infusions: . sodium chloride    . sodium chloride 10 mL/hr at 05/14/20 0610  . heparin 1,050 Units/hr (05/13/20 1800)   PRN Meds: sodium chloride, morphine injection, ondansetron **OR** ondansetron (ZOFRAN) IV, sodium chloride flush   Vital Signs    Vitals:   05/13/20 1629 05/13/20 2011 05/14/20 0018 05/14/20 0633  BP: (!) 111/52     Pulse: 81 83    Resp:  20    Temp: 98.1 F (36.7 C) 99.1 F (37.3 C) 98 F (36.7 C) 98 F (36.7 C)  TempSrc: Oral Oral Oral Oral  SpO2: 99% 92%  96%  Weight:    92.6 kg  Height:        Intake/Output Summary (Last 24 hours) at 05/14/2020 0804 Last data filed at 05/14/2020 0646 Gross per 24 hour  Intake 729.51 ml  Output --  Net 729.51 ml   Last 3 Weights 05/14/2020 05/13/2020 05/12/2020  Weight (lbs) 204 lb 2.3 oz 200 lb 8 oz 199 lb 12.8 oz  Weight (kg) 92.6 kg 90.946 kg 90.629 kg      Telemetry    SR  - Personally Reviewed  ECG    SR with deep T wave inversions, LVH - Personally Reviewed  Physical Exam   GEN: No acute distress.   Neck: No JVD Cardiac: RRR, no murmurs, rubs, or gallops.  Respiratory: Clear to auscultation bilaterally. GI: Soft, nontender, non-distended  MS: No edema; No deformity. Neuro:  Nonfocal  Psych: Normal affect   Labs    High Sensitivity Troponin:   Recent Labs  Lab 05/12/20 2015 05/12/20 2304 05/13/20 0034 05/13/20 1354  TROPONINIHS 71* 387* 657* 11,502*      Chemistry Recent Labs  Lab 05/12/20 2015 05/13/20 0503 05/14/20 0518  NA 137 135 138  K 3.2* 4.3 4.1  CL 93* 93* 96*   CO2 28 30 30   GLUCOSE 120* 95 89  BUN 22 28* 37*  CREATININE 8.27* 9.41* 12.53*  CALCIUM 7.9* 8.2* 8.2*  PROT 7.3  --  6.9  ALBUMIN 3.2*  --  2.8*  AST 14*  --  35  ALT 9  --  11  ALKPHOS 54  --  52  BILITOT 0.9  --  1.0  GFRNONAA 6* 5* 4*  GFRAA 7* 6* 4*  ANIONGAP 16* 12 12     Hematology Recent Labs  Lab 05/12/20 2015 05/13/20 0503 05/14/20 0518  WBC 7.2 5.6 5.8  RBC 3.18* 3.25* 3.09*  HGB 10.4* 10.7* 10.1*  HCT 30.2* 31.4* 30.0*  MCV 95.0 96.6 97.1  MCH 32.7 32.9 32.7  MCHC 34.4 34.1 33.7  RDW 14.6 14.7 15.0  PLT 201 192 187    BNPNo results for input(s): BNP, PROBNP in the last 168 hours.   DDimer No results for input(s): DDIMER in the last 168 hours.   Radiology    DG Chest Port 1 View  Result Date: 05/12/2020 CLINICAL DATA:  CP EXAM: PORTABLE CHEST 1 VIEW COMPARISON:  Chest radiograph 06/20/2019  FINDINGS: Interval removal of a right central venous catheter. Stable cardiomediastinal contours with enlarged heart size. Aortic arch calcification. There is diffuse bilateral coarsening of the interstitium which likely represents chronic bronchitic change. No focal consolidation. No pneumothorax or large pleural effusion. No acute finding in the visualized skeleton. IMPRESSION: Cardiomegaly. Diffuse coarsening of the interstitium likely chronic bronchitic change. Electronically Signed   By: Audie Pinto M.D.   On: 05/12/2020 19:51   Cardiac Studies   Echo 05/13/20: LVEF 55-60%.  No R WMA.  Mild LVH.  GR 1 DD/impaired relaxation.  PA pressure.  Mild LA dilation.  Moderate MR.  Mild aortic sclerosis without stenosis.  Cath 05/14/20: Conclusion: 1.  Severe multivessel coronary artery disease with severe calcific distal left main stenosis, severe diffuse ostial/proximal LAD stenosis, severe stenosis of the proximal left circumflex/first OM, and interval occlusion of the distal RCA with left-to-right collaterals 2.  Heavily calcified distal abdominal aorta and  bilateral iliac arteries with mild diffuse calcified disease.  No severe iliac lesions identified.  Recommend ongoing medical therapy.  With total occlusion of the RCA and severe calcification of the distal left main and complex disease in the LAD and left circumflex, I think risk of protected PCI would be extremely high.  I also think his access is marginal for large bore support devices from the groin.  Diagnostic Dominance: Right     Patient Profile     64 y.o. male with a history of 3-vessel CAD (medically managedsince patient not a candidate for CABG/High risk PCIs), diastolic heart failure, heavy ETOH use, atrial flutter/fibrillation (not an anticoagulation candidate), ESRD on HD (TTS), hypertension, and hyperlipidemia, who presented to the hospital with complaints of substernal chest pain --> initial presentation was with neurologic changes somnolence related to hypotension following dialysis.  Started having chest pain after dialysis.  Upon arrival to the hospital blood pressure in the 70s.  EMS EKG simply showed A. fib, but ER EKG showed 1 readout with subtle inferior ST elevations but notably wider QRS complexes and bradycardic. Follow-up EKGs showed resolution of ST changes back to his baseline and his pain was improving.  Code STEMI was called - evaluted by Dr Ellyn Hack interventional cardiology as patient was initially code STEMI. Thought that emegent cath would not be helpful given resolution of ST changes and resolution of symptoms with noramlization of bp's. Likely his known chronic disease in the settning hypotension led to the transient changes.  Prior caths were reviewed, thought overall very difficult anatomy to consider any form of revasc (see his notes for more detail). Recs were for medical management if recurrent significant symptosm reconsider cath at that time.   Assessment & Plan    1.  Non-STEMI versus demand ischemia in the setting of hypotension postdialysis: trop  71-->387-->657-->11,502 - started after dialysis in setting of significant hypotension - initially had inferior ST elevations that resolved with improved bp's. Chest pain also resolved  - EKG yesterday SR, chronic TWIs - echo, with EF 55-60% no RWMA G1DD.   mod MR,   - medical therapy with ASA 81, atorva 80, plavix 75, hep gtt -will resume imdur for now with elevated BP  - for cardiac cath today  2. Persistent afib now maintaining SR  - from notes has not been anticoag due to concern about compliance and EtOH use   3. ESRD (TTS)  4.  Hypotension with HD, now elevated will add back imdur, but should hold on dialysis days  5.  HLD on  lipitor 80 but LDL is 113, will refer to lipid clinic      For questions or updates, please contact [place] Please consult [email or web address] for contact info under        Signed, Cecilie Kicks, NP  05/14/2020, 8:04 AM    ATTENDING ATTESTATION  I have seen, examined and evaluated the patient this AM in the Cath lab shortly after he was seen Cecilie Kicks, NP-C.  After reviewing all the available data and chart, we discussed the patients laboratory, study & physical findings as well as symptoms in detail. I agree with her findings, examination as well as impression recommendations as per our discussion.    Attending adjustments noted in italics.   I reviewed cath films with Dr. Burt Knack and then saw the patient following catheterization --> culprit lesion is heavily thrombotic occlusion of a very tortuous calcified RCA beyond the focal lesion noted in previous cath.  After discussion with Dr. Burt Knack, we both agreed that in the absence of any ongoing pain, attempting PCI on this vessel would be less than favorable given the amount of thrombus and the tortuosity of the vessel.  Unfortunately, despite less than significant troponin elevation and resolution of ST elevations, the patient essentially has occluded vessel.  His left system is stable.  Plan  will be continue medical management as he is relatively sedentary.  Had previously been turned down for CABG. -->  High risk PCI on the left system is almost prohibitive risk.  Later this afternoon, he was seen in the room, no longer having chest pain. He does have a somewhat hyperdynamic precordium with split S2.  Mild decreased breath sounds in the bases.  Recommend:  Continue aspirin, Plavix and atorvastatin; is now off heparin drip  Resume Imdur-> biggest issue is finding a way to avoid hypotension during dialysis which leads him to become ischemic.  No blood pressure room to do anything else besides Imdur.   Cannot use Ranexa because of end-stage renal disease.  Beta-blocker or calcium channel blockers are not good options because of tendency for hypotension.     Glenetta Hew, M.D., M.S. Interventional Cardiologist   Pager # [phone] Phone # [phone] [address] [place], [place] [address]

## 2020-05-14 NOTE — Interval H&P Note (Signed)
History and Physical Interval Note:  05/14/2020 2:30 PM  Kenneth [name]  has presented today for surgery, with the diagnosis of NSTEMI.  The various methods of treatment have been discussed with the patient and family. After consideration of risks, benefits and other options for treatment, the patient has consented to  Procedure(s): LEFT HEART CATH AND CORONARY ANGIOGRAPHY (N/A) as a surgical intervention.  The patient's history has been reviewed, patient examined, no change in status, stable for surgery.  I have reviewed the patient's chart and labs.  Questions were answered to the patient's satisfaction.     Sherren Mocha

## 2020-05-14 NOTE — Progress Notes (Signed)
Occupational Therapy Evaluation and Discharge  Patient Details Name: Kenneth [name] MRN: [id number] DOB: 14-Oct-1956 Today's Date: 05/14/2020    History of Present Illness HPI per Dr. Gean Birchwood on 05/12/20- Kenneth [name] is a 64 y.o. male with history of severe coronary artery disease medically managed not a candidate for intervention, alcohol abuse A. fib not on anticoagulation candidate ESRD on hemodialysis hypertension hyperlipidemia who presents to the ER after patient started developing chest pain while having dialysis.  Pain was retrosternal nonradiating pressure-like associated nausea vomiting.  No fever chills or productive cough.   Clinical Impression   Pt admitted to the hospital for chest pain while participating in dialysis. Pt limited by decreased activity tolerance. Pt reports being Independent with ADLs at baseline and living with his daughter who performs IADLs. Pt reports taking the bus to dialysis on Tuesdays/Thursdays. Today, pt received sitting EOB, agreeable to OT eval. Pt performs all self-care activities with Mod I without AD. Pt transferred on/off toilet and participated in grooming at sink. Educated pt on BUE HEP to maintain strength/ROM. Pt reports being at baseline and having 24/7 support from daughter at home. No acute OT services needed at this time. Recommend home with S/A as needed for safety. If pt's functional status changes, please re-consult. OT signing off. Thank you.          Follow Up Recommendations  No OT follow up;Supervision - Intermittent    Equipment Recommendations  None recommended by OT    Recommendations for Other Services  None.     Precautions / Restrictions Precautions Precautions: None Restrictions Weight Bearing Restrictions: No      Mobility Bed Mobility Overal bed mobility:  (received sitting EOB)      General bed mobility comments:  (recieved sitting EOB)  Transfers Overall transfer level: Modified  independent Equipment used: None    General transfer comment: Mod I to ambulate to/from bathroom without AD; extended time taken due to slight fatigue    Balance Overall balance assessment: Modified Independent        ADL either performed or assessed with clinical judgement   ADL Overall ADL's : Modified independent      General ADL Comments: Mod I for dressing, grooming, and toileting without AD; no LOB; may need supervision for bathing and more complex ADLs/IADLs to ensure safety     Vision Baseline Vision/History: No visual deficits Patient Visual Report: No change from baseline              Pertinent Vitals/Pain Pain Assessment: No/denies pain     Hand Dominance Right   Extremity/Trunk Assessment Upper Extremity Assessment Upper Extremity Assessment: Overall WFL for tasks assessed   Lower Extremity Assessment Lower Extremity Assessment: Overall WFL for tasks assessed   Cervical / Trunk Assessment Cervical / Trunk Assessment: Normal   Communication Communication Communication: No difficulties   Cognition Arousal/Alertness: Awake/alert Behavior During Therapy: WFL for tasks assessed/performed Overall Cognitive Status: No family/caregiver present to determine baseline cognitive functioning        General Comments: pt oriented to person, birthday, place, president, but required v/c's for month/year   General Comments  Skin intact; educated pt on BUE exercises to maintain UB strength/ROM in order to improve safe functional transfers for OOB ADLs    Exercises Exercises: General Upper Extremity General Exercises - Upper Extremity Shoulder Flexion: AROM;Strengthening;Both;10 reps;Seated Shoulder ABduction: AROM;Strengthening;Both;10 reps;Seated Elbow Flexion: AROM;Strengthening;Both;10 reps;Seated Elbow Extension: AROM;Strengthening;10 reps;Seated     Home Living Family/patient expects to be discharged  to:: Private residence Living Arrangements:  Children Available Help at Discharge: Family;Available 24 hours/day Type of Home: Apartment Home Access: Level entry     Home Layout: One level     Bathroom Shower/Tub: Teacher, early years/pre: Standard     Home Equipment: None          Prior Functioning/Environment Level of Independence: Independent        Comments: pt reports being independent with ADLs at baseline; daughter assists with grocery shopping, cooking, driving; pt takes bus to HD        OT Problem List: Decreased activity tolerance      OT Treatment/Interventions:   OT eval/tx only; no post-acute OT services needed at this time   OT Goals(Current goals can be found in the care plan section) Acute Rehab OT Goals Patient Stated Goal: to return home with daughter OT Goal Formulation: With patient Time For Goal Achievement:  (N/A) Potential to Achieve Goals:  (N/A)   OT Frequency:   OT eval/tx only   AM-PAC OT "6 Clicks" Daily Activity     Outcome Measure Help from another person eating meals?: None Help from another person taking care of personal grooming?: None Help from another person toileting, which includes using toliet, bedpan, or urinal?: None Help from another person bathing (including washing, rinsing, drying)?: A Little Help from another person to put on and taking off regular upper body clothing?: None Help from another person to put on and taking off regular lower body clothing?: None 6 Click Score: 23   End of Session Equipment Utilized During Treatment: Gait belt Nurse Communication: Mobility status;Other (comment) (s/p L heart cath- bed rest orders)  Activity Tolerance: Patient tolerated treatment well Patient left: Other (comment);with call bell/phone within reach (seated EOB )  OT Visit Diagnosis: Muscle weakness (generalized) (M62.81)                Time: 6314-9702 OT Time Calculation (min): 19 min Charges:  OT General Charges $OT Visit: 1 Visit OT Evaluation $OT  Eval Low Complexity: 1 Low  Michel Bickers, OTR/L [facility] [phone]   Francesca Jewett 05/14/2020, 6:35 PM

## 2020-05-14 NOTE — Progress Notes (Signed)
ANTICOAGULATION CONSULT NOTE  Pharmacy Consult for Heparin Indication: chest pain/ACS  No Known Allergies  Patient Measurements: Height: 5\' 11"  (180.3 cm) Weight: 92.6 kg (204 lb 2.3 oz) IBW/kg (Calculated) : 75.3 Heparin Dosing Weight: 90.6 kg  Vital Signs: Temp: 98.1 F (36.7 C) (07/19 0830) Temp Source: Oral (07/19 0830) BP: 148/91 (07/19 0830) Pulse Rate: 69 (07/19 0830)  Labs: Recent Labs    05/12/20 2015 05/12/20 2015 05/12/20 2304 05/13/20 0034 05/13/20 0503 05/13/20 1354 05/14/20 0518  HGB 10.4*   < >  --   --  10.7*  --  10.1*  HCT 30.2*  --   --   --  31.4*  --  30.0*  PLT 201  --   --   --  192  --  187  APTT 195*  --   --   --   --   --   --   LABPROT 14.0  --   --   --   --   --   --   INR 1.1  --   --   --   --   --   --   HEPARINUNFRC  --   --   --   --  0.14* 0.50 0.30  CREATININE 8.27*  --   --   --  9.41*  --  12.53*  TROPONINIHS 71*   < > 387* 657*  --  11,502*  --    < > = values in this interval not displayed.    Estimated Creatinine Clearance: 6.7 mL/min (A) (by C-G formula based on SCr of 12.53 mg/dL (H)).   Medical History: Past Medical History:  Diagnosis Date  . Acute CHF ([place]) 01/2018  . Cardiomyopathy secondary    likely related to HTN heart disease; possibly ETOH related as well  . Chronic combined systolic and diastolic heart failure (HCC)    Echocardiogram 09/22/11: Moderate LVH, EF 11-91%, grade 3 diastolic dysfunction, mild MR, moderate to severe LAE, mild RVE, mild to moderate TR, small to moderate pericardial effusion  . Coronary artery disease    not felt to be candidate for CABG 08/2018; medical thearpy recomended due to high risk of PCI   . Dysrhythmia    aflutter 04/2016, afib 02/2018, not felt to be a candidate for anticoagulation due to non-compliance and ETOH  . ESRD (end stage renal disease) on dialysis ([place])    due to hypertensive nephrosclerosis; TTS; [facility] (09/01/2018)  . History of alcohol abuse   .  Hypertension   . Myocardial infarction [facility])    " mild " per daughter  . PEA (Pulseless electrical activity) ([place])    PEA arrest 03/09/18, treated empirically for hyperkalemia, shock x1 for WCT, given amiodarone, ROSC after 10 min of ACLS    Medications:  Scheduled:  . aspirin EC  81 mg Oral Daily  . atorvastatin  80 mg Oral Daily  . clonazePAM  0.5 mg Oral Daily  . clopidogrel  75 mg Oral QPM  . isosorbide mononitrate  60 mg Oral Daily  . sodium chloride flush  3 mL Intravenous Q12H    Assessment: Patient is a 64 yom that presents to the ED with weakness and chest pain. Heparin drip started for ACS with plans for cath lab in AM.   Heparin level came back therapeutic at 0.3, on 1050 units/hr. Hgb 10, plt 187. No s/sx of bleeding or infusion issues.   Goal of Therapy:  Heparin level 0.3-0.7 units/ml Monitor  platelets by anticoagulation protocol: Yes   Plan:  Cont heparin at 1050 units/hr  Daily HL, CBC  F/u after cath   Antonietta Jewel, PharmD, [place] Pharmacist  Phone: [phone] 05/14/2020 9:02 AM  Please check AMION for all [place] phone numbers After 10:00 PM, call [place] [phone]

## 2020-05-14 NOTE — Progress Notes (Addendum)
PROGRESS NOTE    Kenneth [name]  TGG:[id number] DOB: Nov 10, 1955 DOA: 05/12/2020 PCP: [name], MD   Brief Narrative:  HPI per Dr. Gean Birchwood on 05/12/20 Kenneth [name] is a 64 y.o. male with history of severe coronary artery disease medically managed not a candidate for intervention, alcohol abuse A. fib not on anticoagulation candidate ESRD on hemodialysis hypertension hyperlipidemia who presents to the ER after patient started developing chest pain while having dialysis.  Pain was retrosternal nonradiating pressure-like associated nausea vomiting.  No fever chills or productive cough.  ED Course: In the ER patient was found to be hypotensive with blood pressure systolic in the 86 was given 500 cc normal saline bolus and Zofran.  Abdomen appears benign on exam chest x-ray unremarkable.  EKG was showing initially ST elevation the inferolateral leads which actually resolved with blood pressure improving.  A code STEMI was called and Dr. Ellyn Hack and Dr. Olena Heckle cardiologist had seen the patient and since patient's dysuria patient has resolved at this time they are planning medical management.  Labs are remarkable for high sensitive troponin of 71 and 387 chest x-ray unremarkable hemoglobin 10.4.  Covid test was negative.  **Interim History  The patient's chest pain resolved.  Troponin started trending up and is now 11,502.  He remains on a heparin drip for now.  Cardiology is planning on s taking the patient for a cardiac catheterization today but do not know which time.  We will let nephrology know but the patient given that she will likely stay tonight.  Assessment & Plan:   Principal Problem:   NSTEMI (non-ST elevated myocardial infarction) ([place]) Active Problems:   Essential hypertension   ESRD on hemodialysis ([place])   Atrial fibrillation and flutter ([facility])   Non-ST elevated myocardial infarction ([place])  Chest Pain/NSTEMI -Initially is not felt to be a CABG candidate back in  2019 and medical therapy was recommended due to his high risk of PCI -initial EKG was showing ST elevation in the inferolateral leads which improved with blood pressure improvement.  ;  Chest pain is also resolved -Happened after dialysis in the setting of hypotension -Appreciate cardiology consult and recommendation.    Initially was a code STEMI and Dr. Ellyn Hack evaluated but thought emergent cath not be helpful given that he had resolution of his ST changes -Cardiology recommending obtain echo and following troponin for peak -Patient is on heparin drip gtt, aspirin 81 mg and Plavix 75.    And atorvastatin 80 mg -Continue symptom relief with sublingual nitroglycerin -Not on beta-blockers due to low normal blood pressure.   -Trend cardiac markers and trended up to 11,502 -Echocardiogram showed 70%, grade 1 diastolic dysfunction, left ventricle has no regional wall motion abnormalities but did show moderate LVH and the right ventricle has normal systolic function -Lipid panel done and showed total cholesterol/HDL level of 4.3, cholesterol level 170, HDL 40, LDL 113, triglycerides of 84, VLDL of 17 -Cardiology is resuming patient's Imdur today given his elevated blood pressure -Cardiology planning to medically manage the patient currently but will take the patient for cardiac cath later today  A. Fib presently rate controlled.   -Not a candidate for Long Term anticoagulation per cardiology -Continue to monitor telemetry closely  ESRD on hemodialysis on Tuesday Thursdays and Saturday.   Hyperphosphatemia -Has had dialysis yesterday and developed CP after -Patient's BUNs/creatinine went from 22/8.27 ->28/9.41 -> 37/12.53 -Patient's Phos level is now 6.7 -We will need a nephrology consultation and dialysis maintenance -  Repeat Renal panel in the a.m.  Chronic combined systolic and diastolic heart failure/cardiomyopathy likely related to hypertensive heart disease and alcoholism -His echo appears  to have improved and shows diastolic dysfunction His EF is 55 to 60% -Volume maintenance per dialysis: We will let nephrology know about the patient being in the hospital and likely can be dialyzed tomorrow -We will continue to monitor and trend  Hypertension -Medications as above and cardiology is resumed the patient's Imdur given that he has hypotension with hemodialysis -Last blood pressure was 158/83  Hypokalemia  -improved and is repleted and is 4.1 -Continue to monitor and replete as necessary  Anemia likely from renal disease -Patient's hemoglobin/hematocrit is now stable at 10.1/30.0 -Continue to monitor for signs and symptoms of bleeding as patient is anticoagulated heparin drip -Currently no overt bleeding -We will repeat CBC in a.m. and will also check anemia panel in a.m.  Prediabetes -The patient's hemoglobin A1c is 5.7 -Continue monitor blood sugars carefully and if necessary will place on sensitive NovoLog sliding scale insulin AC  History of alcohol abuse  -Continue to monitor for any withdrawal.  DVT prophylaxis: Anticoagulated with Heparin gtt Code Status: FULL CODE Family Communication: No family present at bedside  Disposition Plan: Pending further evaluation and clearance by cardiology  Status is: Inpatient  Remains inpatient appropriate because:Unsafe d/c plan, IV treatments appropriate due to intensity of illness or inability to take PO and Inpatient level of care appropriate due to severity of illness   Dispo: The patient is from: Home              Anticipated d/c is to: TBD              Anticipated d/c date is: 1 day              Patient currently is not medically stable to d/c.  Consultants:   Cardiology  Will notify Nephrology   Procedures:  ECHOCARDIOGRAM IMPRESSIONS    1. Left ventricular ejection fraction, by estimation, is 55 to 60%. The  left ventricle has normal function. The left ventricle has no regional  wall motion  abnormalities. There is moderate left ventricular hypertrophy.  Left ventricular diastolic  parameters are consistent with Grade I diastolic dysfunction (impaired  relaxation).  2. Right ventricular systolic function is normal. The right ventricular  size is normal. There is normal pulmonary artery systolic pressure.  3. Left atrial size was mildly dilated.  4. The mitral valve is abnormal. Moderate mitral valve regurgitation.  5. The aortic valve is tricuspid. Aortic valve regurgitation is not  visualized. Mild aortic valve sclerosis is present, with no evidence of  aortic valve stenosis.   FINDINGS  Left Ventricle: Left ventricular ejection fraction, by estimation, is 55  to 60%. The left ventricle has normal function. The left ventricle has no  regional wall motion abnormalities. The left ventricular internal cavity  size was normal in size. There is  moderate left ventricular hypertrophy. Left ventricular diastolic  parameters are consistent with Grade I diastolic dysfunction (impaired  relaxation).   Right Ventricle: The right ventricular size is normal. No increase in  right ventricular wall thickness. Right ventricular systolic function is  normal. There is normal pulmonary artery systolic pressure. The tricuspid  regurgitant velocity is 2.58 m/s, and  with an assumed right atrial pressure of 3 mmHg, the estimated right  ventricular systolic pressure is 69.4 mmHg.   Left Atrium: Left atrial size was mildly dilated.   Right Atrium:  Right atrial size was normal in size.   Pericardium: Trivial pericardial effusion is present.   Mitral Valve: The mitral valve is abnormal. There is mild thickening of  the mitral valve leaflet(s). Moderate mitral annular calcification.  Moderate mitral valve regurgitation.   Tricuspid Valve: The tricuspid valve is normal in structure. Tricuspid  valve regurgitation is mild.   Aortic Valve: The aortic valve is tricuspid. Aortic valve  regurgitation is  not visualized. Mild aortic valve sclerosis is present, with no evidence  of aortic valve stenosis.   Pulmonic Valve: The pulmonic valve was normal in structure. Pulmonic valve  regurgitation is not visualized.   Aorta: The aortic root and ascending aorta are structurally normal, with  no evidence of dilitation.   IAS/Shunts: The interatrial septum was not assessed.     LEFT VENTRICLE  PLAX 2D  LVIDd:     5.30 cm   Diastology  LVIDs:     3.60 cm   LV e' lateral:  5.22 cm/s  LV PW:     1.30 cm   LV E/e' lateral: 21.8  LV IVS:    1.20 cm   LV e' medial:  5.44 cm/s  LVOT diam:   2.10 cm   LV E/e' medial: 21.0  LV SV:     58  LV SV Index:  28  LVOT Area:   3.46 cm    LV Volumes (MOD)  LV vol d, MOD A4C: 117.0 ml  LV SV MOD A4C:   117.0 ml   RIGHT VENTRICLE  RV S prime:   13.30 cm/s  TAPSE (M-mode): 2.1 cm   LEFT ATRIUM       Index    RIGHT ATRIUM      Index  LA diam:    4.60 cm 2.18 cm/m RA Area:   15.80 cm  LA Vol (A2C):  92.8 ml 43.96 ml/m RA Volume:  43.80 ml 20.75 ml/m  LA Vol (A4C):  96.5 ml 45.72 ml/m  LA Biplane Vol: 97.9 ml 46.38 ml/m  AORTIC VALVE  LVOT Vmax:  90.50 cm/s  LVOT Vmean: 52.600 cm/s  LVOT VTI:  0.168 m    AORTA  Ao Root diam: 3.10 cm  Ao Asc diam: 3.70 cm   MITRAL VALVE        TRICUSPID VALVE  MV Area (PHT): 4.21 cm   TV Peak grad:  13.7 mmHg  MV Decel Time: 180 msec   TV Vmax:    1.85 m/s  MV E velocity: 114.00 cm/s TR Peak grad:  26.6 mmHg  MV A velocity: 88.80 cm/s  TR Vmax:    258.00 cm/s  MV E/A ratio: 1.28               SHUNTS               Systemic VTI: 0.17 m               Systemic Diam: 2.10 cm   Antimicrobials:  Anti-infectives (From admission, onward)   None     Subjective: Seen and examined at bedside and he does not complain of any chest pain.   States he slept okay.  No nausea or vomiting.  Denies any lightheadedness or dizziness.  Waiting his cardiac catheterization.  No other concerns or complaints at this time.  Objective: Vitals:   05/14/20 0018 05/14/20 0633 05/14/20 0830 05/14/20 1126  BP:   (!) 148/91 (!) 158/83  Pulse:   69 66  Resp:  18 18  Temp: 98 F (36.7 C) 98 F (36.7 C) 98.1 F (36.7 C) 97.9 F (36.6 C)  TempSrc: Oral Oral Oral Oral  SpO2:  96% 100% 95%  Weight:  92.6 kg    Height:        Intake/Output Summary (Last 24 hours) at 05/14/2020 1155 Last data filed at 05/14/2020 0646 Gross per 24 hour  Intake 729.51 ml  Output --  Net 729.51 ml   Filed Weights   05/12/20 1922 05/13/20 0513 05/14/20 6073  Weight: 90.6 kg 90.9 kg 92.6 kg   Examination: Physical Exam:  Constitutional: WN/WD overweight African-American male currently in no acute distress appears calm Eyes: Lids and conjunctivae normal, sclerae anicteric  ENMT: External Ears, Nose appear normal. Grossly normal hearing.  Neck: Appears normal, supple, no cervical masses, normal ROM, no appreciable thyromegaly,: No JVD Respiratory: Diminished to auscultation bilaterally, no wheezing, rales, rhonchi or crackles. Normal respiratory effort and patient is not tachypenic. No accessory muscle use.  Unlabored breathing Cardiovascular: iRRR, no murmurs / rubs / gallops. S1 and S2 auscultated.  Trace extremity edema Abdomen: Soft, non-tender, distended secondary body habitus. Bowel sounds positive.  GU: Deferred. Musculoskeletal: No clubbing / cyanosis of digits/nails. No joint deformity upper and lower extremities.  Skin: No rashes, lesions, ulcers on limited skin evaluation. No induration; Warm and dry.  Neurologic: CN 2-12 grossly intact with no focal deficits. Romberg sign and cerebellar reflexes not assessed.  Psychiatric: Normal judgment and insight. Alert and oriented x 3. Normal mood and appropriate affect.   Data Reviewed: I have personally  reviewed following labs and imaging studies  CBC: Recent Labs  Lab 05/12/20 2015 05/13/20 0503 05/14/20 0518  WBC 7.2 5.6 5.8  NEUTROABS 4.4  --  2.2  HGB 10.4* 10.7* 10.1*  HCT 30.2* 31.4* 30.0*  MCV 95.0 96.6 97.1  PLT 201 192 710   Basic Metabolic Panel: Recent Labs  Lab 05/12/20 2015 05/13/20 0503 05/14/20 0518  NA 137 135 138  K 3.2* 4.3 4.1  CL 93* 93* 96*  CO2 28 30 30   GLUCOSE 120* 95 89  BUN 22 28* 37*  CREATININE 8.27* 9.41* 12.53*  CALCIUM 7.9* 8.2* 8.2*  MG  --   --  1.8  PHOS  --   --  6.7*   GFR: Estimated Creatinine Clearance: 6.7 mL/min (A) (by C-G formula based on SCr of 12.53 mg/dL (H)). Liver Function Tests: Recent Labs  Lab 05/12/20 2015 05/14/20 0518  AST 14* 35  ALT 9 11  ALKPHOS 54 52  BILITOT 0.9 1.0  PROT 7.3 6.9  ALBUMIN 3.2* 2.8*   No results for input(s): LIPASE, AMYLASE in the last 168 hours. No results for input(s): AMMONIA in the last 168 hours. Coagulation Profile: Recent Labs  Lab 05/12/20 2015  INR 1.1   Cardiac Enzymes: No results for input(s): CKTOTAL, CKMB, CKMBINDEX, TROPONINI in the last 168 hours. BNP (last 3 results) No results for input(s): PROBNP in the last 8760 hours. HbA1C: Recent Labs    05/12/20 2015  HGBA1C 5.7*   CBG: Recent Labs  Lab 05/13/20 0236 05/13/20 0746 05/13/20 1619 05/14/20 0020 05/14/20 0828  GLUCAP 91 83 133* 81 89   Lipid Profile: Recent Labs    05/12/20 2015  CHOL 170  HDL 40*  LDLCALC 113*  TRIG 84  CHOLHDL 4.3   Thyroid Function Tests: No results for input(s): TSH, T4TOTAL, FREET4, T3FREE, THYROIDAB in the last 72 hours. Anemia Panel: No results for input(s):  VITAMINB12, FOLATE, FERRITIN, TIBC, IRON, RETICCTPCT in the last 72 hours. Sepsis Labs: No results for input(s): PROCALCITON, LATICACIDVEN in the last 168 hours.  Recent Results (from the past 240 hour(s))  SARS Coronavirus 2 by RT PCR (hospital order, performed in [facility] hospital lab) Nasopharyngeal  Nasopharyngeal Swab     Status: None   Collection Time: 05/12/20  7:30 PM   Specimen: Nasopharyngeal Swab  Result Value Ref Range Status   SARS Coronavirus 2 NEGATIVE NEGATIVE Final    Comment: (NOTE) SARS-CoV-2 target nucleic acids are NOT DETECTED.  The SARS-CoV-2 RNA is generally detectable in upper and lower respiratory specimens during the acute phase of infection. The lowest concentration of SARS-CoV-2 viral copies this assay can detect is 250 copies / mL. A negative result does not preclude SARS-CoV-2 infection and should not be used as the sole basis for treatment or other patient management decisions.  A negative result may occur with improper specimen collection / handling, submission of specimen other than nasopharyngeal swab, presence of viral mutation(s) within the areas targeted by this assay, and inadequate number of viral copies (<250 copies / mL). A negative result must be combined with clinical observations, patient history, and epidemiological information.  Fact Sheet for Patients:   StrictlyIdeas.no  Fact Sheet for Healthcare Providers: BankingDealers.co.za  This test is not yet approved or  cleared by the [place] FDA and has been authorized for detection and/or diagnosis of SARS-CoV-2 by FDA under an Emergency Use Authorization (EUA).  This EUA will remain in effect (meaning this test can be used) for the duration of the COVID-19 declaration under Section 564(b)(1) of the Act, 21 U.S.C. section 360bbb-3(b)(1), unless the authorization is terminated or revoked sooner.  Performed at [facility], [place] [address] [place], [place] [address]   MRSA PCR Screening     Status: None   Collection Time: 05/13/20 12:12 PM   Specimen: Nasal Mucosa; Nasopharyngeal  Result Value Ref Range Status   MRSA by PCR NEGATIVE NEGATIVE Final    Comment:        The GeneXpert MRSA Assay (FDA approved for NASAL  specimens only), is one component of a comprehensive MRSA colonization surveillance program. It is not intended to diagnose MRSA infection nor to guide or monitor treatment for MRSA infections. Performed at [facility], [place] [address], [place], [place] [address]      RN Pressure Injury Documentation:     Estimated body mass index is 28.47 kg/m as calculated from the following:   Height as of this encounter: 5\' 11"  (1.803 m).   Weight as of this encounter: 92.6 kg.  Malnutrition Type:      Malnutrition Characteristics:      Nutrition Interventions:    Radiology Studies: DG Chest Port 1 View  Result Date: 05/12/2020 CLINICAL DATA:  CP EXAM: PORTABLE CHEST 1 VIEW COMPARISON:  Chest radiograph 06/20/2019 FINDINGS: Interval removal of a right central venous catheter. Stable cardiomediastinal contours with enlarged heart size. Aortic arch calcification. There is diffuse bilateral coarsening of the interstitium which likely represents chronic bronchitic change. No focal consolidation. No pneumothorax or large pleural effusion. No acute finding in the visualized skeleton. IMPRESSION: Cardiomegaly. Diffuse coarsening of the interstitium likely chronic bronchitic change. Electronically Signed   By: Audie Pinto M.D.   On: 05/12/2020 19:51   ECHOCARDIOGRAM COMPLETE  Result Date: 05/13/2020    ECHOCARDIOGRAM REPORT   Patient Name:   Kenneth [name] Date of Exam: 05/13/2020 Medical  Rec #:  [id number]      Height:       71.0 in Accession #:    [id number]     Weight:       200.5 lb Date of Birth:  10/04/1956      BSA:          2.111 m Patient Age:    73 years       BP:           81/69 mmHg Patient Gender: M              HR:           71 bpm. Exam Location:  Inpatient Procedure: 2D Echo Indications:    chest pain 786.50  History:        Patient has prior history of Echocardiogram examinations, most                 recent 09/18/2018. End stage renal disease. NSTEMI.,                  Arrythmias:Atrial Fibrillation; Risk Factors:Hypertension.  Sonographer:    Johny Chess Referring Phys: [id number] [place]  1. Left ventricular ejection fraction, by estimation, is 55 to 60%. The left ventricle has normal function. The left ventricle has no regional wall motion abnormalities. There is moderate left ventricular hypertrophy. Left ventricular diastolic parameters are consistent with Grade I diastolic dysfunction (impaired relaxation).  2. Right ventricular systolic function is normal. The right ventricular size is normal. There is normal pulmonary artery systolic pressure.  3. Left atrial size was mildly dilated.  4. The mitral valve is abnormal. Moderate mitral valve regurgitation.  5. The aortic valve is tricuspid. Aortic valve regurgitation is not visualized. Mild aortic valve sclerosis is present, with no evidence of aortic valve stenosis. FINDINGS  Left Ventricle: Left ventricular ejection fraction, by estimation, is 55 to 60%. The left ventricle has normal function. The left ventricle has no regional wall motion abnormalities. The left ventricular internal cavity size was normal in size. There is  moderate left ventricular hypertrophy. Left ventricular diastolic parameters are consistent with Grade I diastolic dysfunction (impaired relaxation). Right Ventricle: The right ventricular size is normal. No increase in right ventricular wall thickness. Right ventricular systolic function is normal. There is normal pulmonary artery systolic pressure. The tricuspid regurgitant velocity is 2.58 m/s, and  with an assumed right atrial pressure of 3 mmHg, the estimated right ventricular systolic pressure is 64.3 mmHg. Left Atrium: Left atrial size was mildly dilated. Right Atrium: Right atrial size was normal in size. Pericardium: Trivial pericardial effusion is present. Mitral Valve: The mitral valve is abnormal. There is mild thickening of the mitral valve leaflet(s). Moderate  mitral annular calcification. Moderate mitral valve regurgitation. Tricuspid Valve: The tricuspid valve is normal in structure. Tricuspid valve regurgitation is mild. Aortic Valve: The aortic valve is tricuspid. Aortic valve regurgitation is not visualized. Mild aortic valve sclerosis is present, with no evidence of aortic valve stenosis. Pulmonic Valve: The pulmonic valve was normal in structure. Pulmonic valve regurgitation is not visualized. Aorta: The aortic root and ascending aorta are structurally normal, with no evidence of dilitation. IAS/Shunts: The interatrial septum was not assessed.  LEFT VENTRICLE PLAX 2D LVIDd:         5.30 cm      Diastology LVIDs:         3.60 cm      LV e' lateral:   5.22 cm/s LV  PW:         1.30 cm      LV E/e' lateral: 21.8 LV IVS:        1.20 cm      LV e' medial:    5.44 cm/s LVOT diam:     2.10 cm      LV E/e' medial:  21.0 LV SV:         58 LV SV Index:   28 LVOT Area:     3.46 cm  LV Volumes (MOD) LV vol d, MOD A4C: 117.0 ml LV SV MOD A4C:     117.0 ml RIGHT VENTRICLE RV S prime:     13.30 cm/s TAPSE (M-mode): 2.1 cm LEFT ATRIUM             Index       RIGHT ATRIUM           Index LA diam:        4.60 cm 2.18 cm/m  RA Area:     15.80 cm LA Vol (A2C):   92.8 ml 43.96 ml/m RA Volume:   43.80 ml  20.75 ml/m LA Vol (A4C):   96.5 ml 45.72 ml/m LA Biplane Vol: 97.9 ml 46.38 ml/m  AORTIC VALVE LVOT Vmax:   90.50 cm/s LVOT Vmean:  52.600 cm/s LVOT VTI:    0.168 m  AORTA Ao Root diam: 3.10 cm Ao Asc diam:  3.70 cm MITRAL VALVE                TRICUSPID VALVE MV Area (PHT): 4.21 cm     TV Peak grad:   13.7 mmHg MV Decel Time: 180 msec     TV Vmax:        1.85 m/s MV E velocity: 114.00 cm/s  TR Peak grad:   26.6 mmHg MV A velocity: 88.80 cm/s   TR Vmax:        258.00 cm/s MV E/A ratio:  1.28                             SHUNTS                             Systemic VTI:  0.17 m                             Systemic Diam: 2.10 cm Dorris Carnes MD Electronically signed by Dorris Carnes MD  Signature Date/Time: 05/13/2020/2:09:56 PM    Final    Scheduled Meds: . aspirin EC  81 mg Oral Daily  . atorvastatin  80 mg Oral Daily  . clonazePAM  0.5 mg Oral Daily  . clopidogrel  75 mg Oral QPM  . isosorbide mononitrate  60 mg Oral Daily  . sodium chloride flush  3 mL Intravenous Q12H   Continuous Infusions: . sodium chloride    . sodium chloride 10 mL/hr at 05/14/20 0610  . heparin 1,050 Units/hr (05/13/20 1800)    LOS: 2 days   Kerney Elbe, DO [facility] PAGER is on AMION  If 7PM-7AM, please contact night-coverage [email or web address]

## 2020-05-15 ENCOUNTER — Inpatient Hospital Stay (HOSPITAL_COMMUNITY): Payer: Medicare Other

## 2020-05-15 ENCOUNTER — Encounter (HOSPITAL_COMMUNITY): Payer: Medicare Other

## 2020-05-15 ENCOUNTER — Encounter (HOSPITAL_COMMUNITY): Payer: Self-pay | Admitting: Cardiovascular Disease

## 2020-05-15 DIAGNOSIS — I10 Essential (primary) hypertension: Secondary | ICD-10-CM

## 2020-05-15 DIAGNOSIS — I724 Aneurysm of artery of lower extremity: Secondary | ICD-10-CM | POA: Diagnosis not present

## 2020-05-15 LAB — CBC
HCT: 28.4 % — ABNORMAL LOW (ref 39.0–52.0)
Hemoglobin: 9.8 g/dL — ABNORMAL LOW (ref 13.0–17.0)
MCH: 33.7 pg (ref 26.0–34.0)
MCHC: 34.5 g/dL (ref 30.0–36.0)
MCV: 97.6 fL (ref 80.0–100.0)
Platelets: 192 10*3/uL (ref 150–400)
RBC: 2.91 MIL/uL — ABNORMAL LOW (ref 4.22–5.81)
RDW: 15 % (ref 11.5–15.5)
WBC: 5.6 10*3/uL (ref 4.0–10.5)
nRBC: 0 % (ref 0.0–0.2)

## 2020-05-15 LAB — RENAL FUNCTION PANEL
Albumin: 2.7 g/dL — ABNORMAL LOW (ref 3.5–5.0)
Anion gap: 15 (ref 5–15)
BUN: 52 mg/dL — ABNORMAL HIGH (ref 8–23)
CO2: 28 mmol/L (ref 22–32)
Calcium: 8.4 mg/dL — ABNORMAL LOW (ref 8.9–10.3)
Chloride: 93 mmol/L — ABNORMAL LOW (ref 98–111)
Creatinine, Ser: 14.93 mg/dL — ABNORMAL HIGH (ref 0.61–1.24)
GFR calc Af Amer: 3 mL/min — ABNORMAL LOW (ref >60)
GFR calc non Af Amer: 3 mL/min — ABNORMAL LOW (ref >60)
Glucose, Bld: 76 mg/dL (ref 70–99)
Phosphorus: 6.5 mg/dL — ABNORMAL HIGH (ref 2.5–4.6)
Potassium: 4.4 mmol/L (ref 3.5–5.1)
Sodium: 136 mmol/L (ref 135–145)

## 2020-05-15 LAB — HEPATITIS B SURFACE ANTIGEN: Hepatitis B Surface Ag: NONREACTIVE

## 2020-05-15 LAB — MAGNESIUM: Magnesium: 1.8 mg/dL (ref 1.7–2.4)

## 2020-05-15 LAB — HEPATITIS B SURFACE ANTIBODY,QUALITATIVE: Hep B S Ab: REACTIVE — AB

## 2020-05-15 LAB — GLUCOSE, CAPILLARY: Glucose-Capillary: 88 mg/dL (ref 70–99)

## 2020-05-15 LAB — HEPATITIS B CORE ANTIBODY, TOTAL: Hep B Core Total Ab: NONREACTIVE

## 2020-05-15 NOTE — Progress Notes (Signed)
Pseudoaneurysm study completed.   See Cv Proc for preliminary results.   Darlin Coco

## 2020-05-15 NOTE — Procedures (Signed)
Seen and examined on dialysis at 11:20 am.  Blood pressure 116/64 and HR 61.   Left AVF.  Tolerating goal.  Feels well.   Claudia Desanctis, MD 05/15/2020 11:22 AM

## 2020-05-15 NOTE — Evaluation (Signed)
Physical Therapy Evaluation Patient Details Name: Kenneth [name] MRN: [id number] DOB: 1956-02-13 Today's Date: 05/15/2020   History of Present Illness  64 y.o. male with history of severe coronary artery disease medically managed not a candidate for intervention, alcohol abuse A. fib not on anticoagulation candidate ESRD on hemodialysis hypertension hyperlipidemia who presents to the ER after patient started developing chest pain while having dialysis. In the ER patient was found to be hypotensive with blood pressure systolic in the 86. EKG was showing initially ST elevation the inferolateral leads which actually resolved with blood pressure improving. Admitted 05/12/20 for treatment of NSTEMI. s/p 05/14/20 heart catheterization and coronary angiography  Clinical Impression  PTA pt living with son and daughter in single story apartment with level entry. Pt independent with mobility and ADLs, daughter assists with iADLs doing the shopping and cooking. Pt takes bus to dialysis TTS. Pt is limited today in safe mobility by decreased dynamic balance likely due to 5/10 pain at L femoral access site for heart cath yesterday. Pt is mod I for bed mobility, and transfers and supervision for ambulation of 450 feet without AD. PT will not have any additional PT needs at discharge, however PT will continue to see acutely to address balance deficits.      Follow Up Recommendations No PT follow up;Supervision - Intermittent    Equipment Recommendations  None recommended by PT       Precautions / Restrictions Precautions Precautions: None Restrictions Weight Bearing Restrictions: No      Mobility  Bed Mobility Overal bed mobility: Modified Independent             General bed mobility comments: increased effort to come to sitting from flattened bed surface  Transfers Overall transfer level: Modified independent Equipment used: None             General transfer comment: good power up and self  steadying   Ambulation/Gait Ambulation/Gait assistance: Supervision Gait Distance (Feet): 450 Feet Assistive device: None Gait Pattern/deviations: Step-through pattern;Drifts right/left Gait velocity: WFL Gait velocity interpretation: 1.31 - 2.62 ft/sec, indicative of limited community ambulator General Gait Details: supervision for safety with mildly unsteady antalgic gait due to L femoral access for heart cath.       Balance Overall balance assessment: Mild deficits observed, not formally tested                                           Pertinent Vitals/Pain Pain Assessment: 0-10 Pain Score: 5  Pain Location: L groin s/d catheterization     Home Living Family/patient expects to be discharged to:: Private residence Living Arrangements: Children Available Help at Discharge: Family;Available 24 hours/day Type of Home: Apartment Home Access: Level entry     Home Layout: One level Home Equipment: None      Prior Function Level of Independence: Independent         Comments: pt reports being independent with ADLs at baseline; daughter assists with grocery shopping, cooking, driving; pt takes bus to HD     Hand Dominance   Dominant Hand: Right    Extremity/Trunk Assessment   Upper Extremity Assessment Upper Extremity Assessment: Overall WFL for tasks assessed    Lower Extremity Assessment Lower Extremity Assessment: Overall WFL for tasks assessed    Cervical / Trunk Assessment Cervical / Trunk Assessment: Normal  Communication   Communication: No difficulties  Cognition Arousal/Alertness: Awake/alert Behavior During Therapy: WFL for tasks assessed/performed Overall Cognitive Status: Within Functional Limits for tasks assessed                                        General Comments General comments (skin integrity, edema, etc.): VSS on RA max HR with ambulation 118bpm        Assessment/Plan    PT Assessment Patient  needs continued PT services  PT Problem List Decreased balance       PT Treatment Interventions Gait training;Functional mobility training;Therapeutic activities;Therapeutic exercise;Balance training;Cognitive remediation;Patient/family education    PT Goals (Current goals can be found in the Care Plan section)  Acute Rehab PT Goals Patient Stated Goal: to return home with daughter PT Goal Formulation: With patient Time For Goal Achievement: 05/29/20 Potential to Achieve Goals: Good    Frequency Min 3X/week    AM-PAC PT "6 Clicks" Mobility  Outcome Measure Help needed turning from your back to your side while in a flat bed without using bedrails?: None Help needed moving from lying on your back to sitting on the side of a flat bed without using bedrails?: None Help needed moving to and from a bed to a chair (including a wheelchair)?: None Help needed standing up from a chair using your arms (e.g., wheelchair or bedside chair)?: None Help needed to walk in hospital room?: None Help needed climbing 3-5 steps with a railing? : None 6 Click Score: 24    End of Session Equipment Utilized During Treatment: Gait belt Activity Tolerance: Patient tolerated treatment well Patient left: in bed;with call bell/phone within reach;with nursing/sitter in room (sitting EoB waiting for breakfast) Nurse Communication: Mobility status;Other (comment) (L groin pain ) PT Visit Diagnosis: Other abnormalities of gait and mobility (R26.89);Unsteadiness on feet (R26.81);Difficulty in walking, not elsewhere classified (R26.2);Pain Pain - Right/Left: Left Pain - part of body:  (groin )    Time: 1224-8250 PT Time Calculation (min) (ACUTE ONLY): 24 min   Charges:   PT Evaluation $PT Eval Moderate Complexity: 1 Mod PT Treatments $Therapeutic Exercise: 8-22 mins        Mildred Tuccillo B. Migdalia Dk PT, DPT Acute Rehabilitation Services Pager [phone] Office [phone]   [place] 05/15/2020, 10:26 AM

## 2020-05-15 NOTE — Progress Notes (Signed)
Pt refuses to have Cbg done.

## 2020-05-15 NOTE — Progress Notes (Addendum)
Progress Note  Patient Name: Kenneth [name] Date of Encounter: 05/15/2020  [facility] Cardiologist: Peter [name], MD   Subjective   Saw patient while in dialysis. No chest pain or shortness of breath. He does note some tenderness of right femoral cath site.  Inpatient Medications    Scheduled Meds: . aspirin EC  81 mg Oral Daily  . atorvastatin  80 mg Oral Daily  . calcitRIOL  2.5 mcg Oral Q T,Th,Sa-HD  . Chlorhexidine Gluconate Cloth  6 each Topical Q0600  . cinacalcet  90 mg Oral Q T,Th,Sa-HD  . clonazePAM  0.5 mg Oral Daily  . clopidogrel  75 mg Oral QPM  . isosorbide mononitrate  60 mg Oral Daily  . lanthanum  2,000 mg Oral TID WC  . multivitamin  1 tablet Oral QHS  . sodium chloride flush  3 mL Intravenous Q12H  . sodium chloride flush  3 mL Intravenous Q12H   Continuous Infusions: . sodium chloride     PRN Meds: sodium chloride, acetaminophen, morphine injection, ondansetron **OR** ondansetron (ZOFRAN) IV, sodium chloride flush   Vital Signs    Vitals:   05/14/20 1629 05/14/20 2048 05/14/20 2349 05/15/20 0428  BP: 135/85 (!) 151/84 (!) 149/82 (!) 152/72  Pulse: 72 75 78 87  Resp: 20  19 18   Temp: 97.6 F (36.4 C) 97.7 F (36.5 C) 97.9 F (36.6 C) 97.7 F (36.5 C)  TempSrc: Oral Oral Oral Oral  SpO2: 93% 98% 99% 95%  Weight:    91.4 kg  Height:        Intake/Output Summary (Last 24 hours) at 05/15/2020 0939 Last data filed at 05/14/2020 1800 Gross per 24 hour  Intake 600 ml  Output --  Net 600 ml   Last 3 Weights 05/15/2020 05/14/2020 05/13/2020  Weight (lbs) 201 lb 7 oz 204 lb 2.3 oz 200 lb 8 oz  Weight (kg) 91.37 kg 92.6 kg 90.946 kg      Telemetry    Normal sinus rhythm. Rates in the 70's to 90's. Some PVCs (couplets/ triplets)  - Personally Reviewed  ECG    No new ECG tracing today. - Personally Reviewed  Physical Exam   GEN: No acute distress.   Neck: Supple. No JVD. Cardiac: RRR with ectopy. No significant murmurs, rubs, or  gallops appreciated. Hematoma of right femoral cath site mildly tender to palpation. Respiratory: Clear to auscultation bilaterally. No wheezes, rhonchi, or rales appreciated.  GI: Soft, non-tender, non-distended  MS: No lower extremity edema; No deformity. Neuro:  No focal deficits.  Psych: Flat affect.  Labs    High Sensitivity Troponin:   Recent Labs  Lab 05/12/20 2015 05/12/20 2304 05/13/20 0034 05/13/20 1354  TROPONINIHS 71* 387* 657* 11,502*      Chemistry Recent Labs  Lab 05/12/20 2015 05/12/20 2015 05/13/20 0503 05/14/20 0518 05/15/20 0424  NA 137   < > 135 138 136  K 3.2*   < > 4.3 4.1 4.4  CL 93*   < > 93* 96* 93*  CO2 28   < > 30 30 28   GLUCOSE 120*   < > 95 89 76  BUN 22   < > 28* 37* 52*  CREATININE 8.27*   < > 9.41* 12.53* 14.93*  CALCIUM 7.9*   < > 8.2* 8.2* 8.4*  PROT 7.3  --   --  6.9  --   ALBUMIN 3.2*  --   --  2.8* 2.7*  AST 14*  --   --  35  --   ALT 9  --   --  11  --   ALKPHOS 54  --   --  52  --   BILITOT 0.9  --   --  1.0  --   GFRNONAA 6*   < > 5* 4* 3*  GFRAA 7*   < > 6* 4* 3*  ANIONGAP 16*   < > 12 12 15    < > = values in this interval not displayed.     Hematology Recent Labs  Lab 05/13/20 0503 05/14/20 0518 05/15/20 0424  WBC 5.6 5.8 5.6  RBC 3.25* 3.09* 2.91*  HGB 10.7* 10.1* 9.8*  HCT 31.4* 30.0* 28.4*  MCV 96.6 97.1 97.6  MCH 32.9 32.7 33.7  MCHC 34.1 33.7 34.5  RDW 14.7 15.0 15.0  PLT 192 187 192    BNPNo results for input(s): BNP, PROBNP in the last 168 hours.   DDimer No results for input(s): DDIMER in the last 168 hours.   Radiology    CARDIAC CATHETERIZATION  Result Date: 05/14/2020 1.  Severe multivessel coronary artery disease with severe calcific distal left main stenosis, severe diffuse ostial/proximal LAD stenosis, severe stenosis of the proximal left circumflex/first OM, and interval occlusion of the distal RCA with left-to-right collaterals 2.  Heavily calcified distal abdominal aorta and bilateral  iliac arteries with mild diffuse calcified disease.  No severe iliac lesions identified. Recommend ongoing medical therapy.  With total occlusion of the RCA and severe calcification of the distal left main and complex disease in the LAD and left circumflex, I think risk of protected PCI would be extremely high.  I also think his access is marginal for large bore support devices from the groin.  ECHOCARDIOGRAM COMPLETE  Result Date: 05/13/2020    ECHOCARDIOGRAM REPORT   Patient Name:   Kenneth [name] Date of Exam: 05/13/2020 Medical Rec #:  [id number]      Height:       71.0 in Accession #:    [id number]     Weight:       200.5 lb Date of Birth:  11/11/1955      BSA:          2.111 m Patient Age:    64 years       BP:           81/69 mmHg Patient Gender: M              HR:           71 bpm. Exam Location:  Inpatient Procedure: 2D Echo Indications:    chest pain 786.50  History:        Patient has prior history of Echocardiogram examinations, most                 recent 09/18/2018. End stage renal disease. NSTEMI.,                 Arrythmias:Atrial Fibrillation; Risk Factors:Hypertension.  Sonographer:    Johny Chess Referring Phys: [id number] [place]  1. Left ventricular ejection fraction, by estimation, is 55 to 60%. The left ventricle has normal function. The left ventricle has no regional wall motion abnormalities. There is moderate left ventricular hypertrophy. Left ventricular diastolic parameters are consistent with Grade I diastolic dysfunction (impaired relaxation).  2. Right ventricular systolic function is normal. The right ventricular size is normal. There is normal pulmonary artery systolic pressure.  3. Left atrial size was mildly dilated.  4. The mitral valve is abnormal. Moderate mitral valve regurgitation.  5. The aortic valve is tricuspid. Aortic valve regurgitation is not visualized. Mild aortic valve sclerosis is present, with no evidence of aortic valve stenosis.  FINDINGS  Left Ventricle: Left ventricular ejection fraction, by estimation, is 55 to 60%. The left ventricle has normal function. The left ventricle has no regional wall motion abnormalities. The left ventricular internal cavity size was normal in size. There is  moderate left ventricular hypertrophy. Left ventricular diastolic parameters are consistent with Grade I diastolic dysfunction (impaired relaxation). Right Ventricle: The right ventricular size is normal. No increase in right ventricular wall thickness. Right ventricular systolic function is normal. There is normal pulmonary artery systolic pressure. The tricuspid regurgitant velocity is 2.58 m/s, and  with an assumed right atrial pressure of 3 mmHg, the estimated right ventricular systolic pressure is 76.1 mmHg. Left Atrium: Left atrial size was mildly dilated. Right Atrium: Right atrial size was normal in size. Pericardium: Trivial pericardial effusion is present. Mitral Valve: The mitral valve is abnormal. There is mild thickening of the mitral valve leaflet(s). Moderate mitral annular calcification. Moderate mitral valve regurgitation. Tricuspid Valve: The tricuspid valve is normal in structure. Tricuspid valve regurgitation is mild. Aortic Valve: The aortic valve is tricuspid. Aortic valve regurgitation is not visualized. Mild aortic valve sclerosis is present, with no evidence of aortic valve stenosis. Pulmonic Valve: The pulmonic valve was normal in structure. Pulmonic valve regurgitation is not visualized. Aorta: The aortic root and ascending aorta are structurally normal, with no evidence of dilitation. IAS/Shunts: The interatrial septum was not assessed.  LEFT VENTRICLE PLAX 2D LVIDd:         5.30 cm      Diastology LVIDs:         3.60 cm      LV e' lateral:   5.22 cm/s LV PW:         1.30 cm      LV E/e' lateral: 21.8 LV IVS:        1.20 cm      LV e' medial:    5.44 cm/s LVOT diam:     2.10 cm      LV E/e' medial:  21.0 LV SV:         58 LV SV  Index:   28 LVOT Area:     3.46 cm  LV Volumes (MOD) LV vol d, MOD A4C: 117.0 ml LV SV MOD A4C:     117.0 ml RIGHT VENTRICLE RV S prime:     13.30 cm/s TAPSE (M-mode): 2.1 cm LEFT ATRIUM             Index       RIGHT ATRIUM           Index LA diam:        4.60 cm 2.18 cm/m  RA Area:     15.80 cm LA Vol (A2C):   92.8 ml 43.96 ml/m RA Volume:   43.80 ml  20.75 ml/m LA Vol (A4C):   96.5 ml 45.72 ml/m LA Biplane Vol: 97.9 ml 46.38 ml/m  AORTIC VALVE LVOT Vmax:   90.50 cm/s LVOT Vmean:  52.600 cm/s LVOT VTI:    0.168 m  AORTA Ao Root diam: 3.10 cm Ao Asc diam:  3.70 cm MITRAL VALVE                TRICUSPID VALVE MV Area (PHT): 4.21 cm     TV Peak grad:  13.7 mmHg MV Decel Time: 180 msec     TV Vmax:        1.85 m/s MV E velocity: 114.00 cm/s  TR Peak grad:   26.6 mmHg MV A velocity: 88.80 cm/s   TR Vmax:        258.00 cm/s MV E/A ratio:  1.28                             SHUNTS                             Systemic VTI:  0.17 m                             Systemic Diam: 2.10 cm Dorris Carnes MD Electronically signed by Dorris Carnes MD Signature Date/Time: 05/13/2020/2:09:56 PM    Final     Cardiac Studies   Echocardiogram 05/13/2020: Impressions: 1. Left ventricular ejection fraction, by estimation, is 55 to 60%. The  left ventricle has normal function. The left ventricle has no regional  wall motion abnormalities. There is moderate left ventricular hypertrophy.  Left ventricular diastolic  parameters are consistent with Grade I diastolic dysfunction (impaired  relaxation).  2. Right ventricular systolic function is normal. The right ventricular  size is normal. There is normal pulmonary artery systolic pressure.  3. Left atrial size was mildly dilated.  4. The mitral valve is abnormal. Moderate mitral valve regurgitation.  5. The aortic valve is tricuspid. Aortic valve regurgitation is not  visualized. Mild aortic valve sclerosis is present, with no evidence of  aortic valve stenosis.    _______________  Left Heart Catheterization 05/14/2020: 1.  Severe multivessel coronary artery disease with severe calcific distal left main stenosis, severe diffuse ostial/proximal LAD stenosis, severe stenosis of the proximal left circumflex/first OM, and interval occlusion of the distal RCA with left-to-right collaterals 2.  Heavily calcified distal abdominal aorta and bilateral iliac arteries with mild diffuse calcified disease.  No severe iliac lesions identified.  Recommend ongoing medical therapy.  With total occlusion of the RCA and severe calcification of the distal left main and complex disease in the LAD and left circumflex, I think risk of protected PCI would be extremely high.  I also think his access is marginal for large bore support devices from the groin.  Diagnostic Dominance: Right    Patient Profile     64 y.o. male with a history 3-vessel CAD being medically managed as he is not felt to be a candidate for CABG/high-risk PCI, chronic diastolic CHF, atrial flutter/fibrillation not an anticoagulation candidate, ESRD on hemodialysis on T/TH/S, hypertension, hyperlipidemia, and ETOH abuse who was admitted with NSTEMI in setting of postdialysis hypotension.  Assessment & Plan    NSTEMI  - High-sensitivity troponin 71 >> 387 >> 657 >> 11,502. - EKG initially showed inferior ST elevations but these resolved with improvement in BP. Chest pain also resolved when BP improved.  - Echo showed LVEF of 55-60% with normal wall motion and grade 1 diastolic dysfunction.  - Cath yesterday showed severe multivessel CAD with severe calcific distal left main stenosis, severe diffuse ostial/proximal LAD stenosis, and severe stenosis of proximal LCX/first OM, and interval occlusion of distal RCA with left to right collaterals. Culprit lesions is heavily thrombotic occlusion of a very tortuous calcified RCA.  - Continue medical therapy recommended. Continue  DAPT with Aspirin and Plavix.  Continue high-intensity statin. No beta-blocker due to soft BP. Continue Imdur (may need to hold on dialysis days if he continues to have hypotension with dialysis.   Hypertension - BP elevated but he has been hypotensive with dialysis.  - Restarted on Imdur 60mg  daily yesterday.  - May need to add additional medications on non-dialysis days.  Hyperlipidemia - LDL 113 this admission. - Continue Lipitor 80mg  daily.  - Can consider referral to lipid clinic as outpatient for consideration of PCSK9 inhibitor.   Hematoma - Hematoma noted of right femoral cath site. Mildly tender to palpation. - Will get arterial ultrasound to rule out pseudoaneurysm. -- result pending:   For questions or updates, please contact [place] Please consult [email or web address] for contact info under        Signed, Darreld Mclean, PA-C  05/15/2020, 9:39 AM     ATTENDING ATTESTATION  I have seen, examined and evaluated the patient this PM  along with Sande Rives, PA.  After reviewing all the available data and chart, we discussed the patients laboratory, study & physical findings as well as symptoms in detail. I agree with her findings, examination as well as impression recommendations as per our discussion.    Attending adjustments noted in italics.   Vidit had a pretty good day today.  He was able to go through dialysis without any chest pain.  We did order Dopplers on his leg that were done but not yet read.  I waited until 7 PM to see the patient to see if these were red and they were not yet read.  Would like to see no evidence of pseudoaneurysm before I say okay for discharge.  From a cardiac standpoint, we have restarted on Imdur and he has been doing okay.  He just hypotensive still on dialysis, but blood pressure is elevated otherwise.. -->  Would consider a trial of low-dose Toprol 25 mg that he takes in the evenings with the exception of predialysis days.  This would give him coverage  during the off days from dialysis and be out of the system when he goes to dialysis.  Otherwise I would not add additional blood pressure medications.  He does need aggressive risk factor modification and we would like to be able to be more aggressive with his lipid management.  As for his coronary disease he has diffuse coronary disease with now occluded RCA for thrombus and left main/LCx and LAD disease.  Previously not thought to be almost prohibitive risk for invasive procedures.  Was turned down for bypass surgery.   We will arrange outpatient follow-up with his primary cardiologist (Dr. Ofilia Neas.  Will discuss with Dr. [name] to understand his recommendations going forward.  At this point before we reconsider invasive options, he needs to show the ability to take medications as directed and follow-up with his clinic appointments in order to show compliance with medications and recommendations.  Recommendations: Await results of femoral ultrasound -> Add Toprol 25 mg in AM with exception of predialysis mornings. -- 1st dose ordered for 7/21  Pending results of femoral arterial ultrasound, okay for discharge   Glenetta Hew, M.D., M.S. Interventional Cardiologist   Pager # [phone] Phone # [phone] [address] [place], [place] [address]

## 2020-05-15 NOTE — Progress Notes (Signed)
PROGRESS NOTE    Kenneth [name]  KKX:[id number] DOB: [birth year] DOA: 05/12/2020 PCP: [name], MD   Brief Narrative:  HPI per Dr. Gean Birchwood on 05/12/20 Kenneth [name] is a 64 y.o. male with history of severe coronary artery disease medically managed not a candidate for intervention, alcohol abuse A. fib not on anticoagulation candidate ESRD on hemodialysis hypertension hyperlipidemia who presents to the ER after patient started developing chest pain while having dialysis.  Pain was retrosternal nonradiating pressure-like associated nausea vomiting.  No fever chills or productive cough.  ED Course: In the ER patient was found to be hypotensive with blood pressure systolic in the 86 was given 500 cc normal saline bolus and Zofran.  Abdomen appears benign on exam chest x-ray unremarkable.  EKG was showing initially ST elevation the inferolateral leads which actually resolved with blood pressure improving.  A code STEMI was called and Dr. Ellyn Hack and Dr. Olena Heckle cardiologist had seen the patient and since patient's dysuria patient has resolved at this time they are planning medical management.  Labs are remarkable for high sensitive troponin of 71 and 387 chest x-ray unremarkable hemoglobin 10.4.  Covid test was negative.  **Interim History  The patient's chest pain resolved.  Troponin started trending up and is now 11,502.  He remains on a heparin drip for now.  Cardiology took the patient for a cardiac catheterization yesterday morning and it showed severe multivessel coronary artery disease and severe calcific distal left main stenosis and severe diffuse ostial/proximal LAD stenosis, severe stenosis of the proximal left circumflex/first OM and intermittent occlusion of the distal RCA with left-to-right collaterals.  He also had heavily calcified distal abdominal aorta and bilateral iliac arteries mild diffuse calcified disease with no severe iliac lesions noted.  Cardiology recommended  ongoing medical therapy but have stopped his heparin drip.  He remains on aspirin 81 mg p.o. daily, atorvastatin 80 mg p.o. daily, clopidogrel 75 mg p.o. daily.  Patient is not on a beta-blocker due to soft blood pressures and the recommending Imdur on nondialysis days.  He did have a slight and they have gotten a ultrasound to rule out pseudoaneurysm and this is still pending read.  Cardiology still needs to clear the patient prior to safe discharge and the final attending note by Dr. Ellyn Hack is pending currently.  Assessment & Plan:   Principal Problem:   NSTEMI (non-ST elevated myocardial infarction) ([place]) Active Problems:   Essential hypertension   ESRD on hemodialysis ([place])   Atrial fibrillation and flutter ([facility])   Non-ST elevated myocardial infarction ([place])  Chest Pain/NSTEMI -Initially is not felt to be a CABG candidate back in 2019 and medical therapy was recommended due to his high risk of PCI -initial EKG was showing ST elevation in the inferolateral leads which improved with blood pressure improvement.  ;  Chest pain is also resolved -Happened after dialysis in the setting of hypotension -Appreciate cardiology consult and recommendation.    Initially was a code STEMI and Dr. Ellyn Hack evaluated but thought emergent cath not be helpful given that he had resolution of his ST changes -Cardiology recommending obtain echo and following troponin for peak -Patient was on heparin drip gtt but now stopped by cardiology, aspirin 81 mg and Plavix 75.    And atorvastatin 80 mg -Continue symptom relief with sublingual nitroglycerin -Not on beta-blockers due to low normal blood pressure.   -Trend cardiac markers and trended up to 11,502 -Echocardiogram showed 16%, grade 1 diastolic dysfunction, left  ventricle has no regional wall motion abnormalities but did show moderate LVH and the right ventricle has normal systolic function -Lipid panel done and showed total cholesterol/HDL level of 4.3,  cholesterol level 170, HDL 40, LDL 113, triglycerides of 84, VLDL of 17 -Cardiology is resuming patient's Imdur today given his elevated blood pressure -Patient underwent cardiac catheterization and it showed "Severe multivessel coronary artery disease with severe calcific distal left main stenosis, severe diffuse ostial/proximal LAD stenosis, severe stenosis of the proximal left circumflex/first OM, and interval occlusion of the distal RCA with left-to-right collaterals 2.  Heavily calcified distal abdominal aorta and bilateral iliac arteries with mild diffuse calcified disease.  No severe iliac lesions identified." -The medical therapy per cardiology and will discharge once he is cleared from their perspective; he is off the heparin drip now -Cardiology did note that he did have a hematoma at the site of his cath and cardiology is checking ultrasound to rule out pseudoaneurysm; ultrasound was done and pending read  A. Fib presently rate controlled.   -Not a candidate for Long Term anticoagulation per cardiology -Continue to monitor telemetry closely -No beta-blocker due to blood pressure -Continue monitor carefully  ESRD on hemodialysis on Tuesday Thursdays and Saturday.   Hyperphosphatemia -Has had dialysis yesterday and developed CP after -Patient's BUNs/creatinine went from 22/8.27 ->28/9.41 -> 37/12.53 -> 52/14.93 -Patient's Phos level is now 6.5 -Nephrology checked and he has a hepatitis B surface antibody that is reactive -Nephrology was consulted and patient had dialysis today.  Chronic combined systolic and diastolic heart failure/cardiomyopathy likely related to hypertensive heart disease and alcoholism -His echo appears to have improved and shows diastolic dysfunction His EF is 55 to 60% -Volume maintenance per dialysis: And he was dialyzed today -1500 mL pulled off  Hypertension -Medications as above and cardiology is resumed the patient's Imdur given that he has hypotension  with hemodialysis -Last blood pressure was 115/89  Hypokalemia  -improved and is repleted and is 4.4 -Continue to monitor and replete as necessary  Anemia likely from renal disease -Patient's hemoglobin/hematocrit is now stable at 10.1/30.0 yesterday and today it is 9.8/20.4 -Continue to monitor for signs and symptoms of bleeding as patient is anticoagulated heparin drip -Currently no overt bleeding -We will repeat CBC in a.m. and will also check anemia panel in a.m.  Prediabetes -The patient's hemoglobin A1c is 5.7 -Continue monitor blood sugars carefully and if necessary will place on sensitive NovoLog sliding scale insulin AC  History of alcohol abuse  -Continue to monitor for any withdrawal.  DVT prophylaxis: Anticoagulated with Heparin gtt Code Status: FULL CODE Family Communication: No family present at bedside  Disposition Plan: Pending further evaluation and clearance by cardiology; patient developed a right femoral cath site hematoma so cardiology is getting arterial ultrasound to rule out pseudoaneurysm and will be discharged once cleared by cardiology as PT OT recommended no follow-up  Status is: Inpatient  Remains inpatient appropriate because:Unsafe d/c plan, IV treatments appropriate due to intensity of illness or inability to take PO and Inpatient level of care appropriate due to severity of illness   Dispo: The patient is from: Home              Anticipated d/c is to: TBD              Anticipated d/c date is: 1 day              Patient currently is not medically stable to d/c.  Consultants:  Cardiology  Will notify Nephrology   Procedures:  ECHOCARDIOGRAM IMPRESSIONS    1. Left ventricular ejection fraction, by estimation, is 55 to 60%. The  left ventricle has normal function. The left ventricle has no regional  wall motion abnormalities. There is moderate left ventricular hypertrophy.  Left ventricular diastolic  parameters are consistent with  Grade I diastolic dysfunction (impaired  relaxation).  2. Right ventricular systolic function is normal. The right ventricular  size is normal. There is normal pulmonary artery systolic pressure.  3. Left atrial size was mildly dilated.  4. The mitral valve is abnormal. Moderate mitral valve regurgitation.  5. The aortic valve is tricuspid. Aortic valve regurgitation is not  visualized. Mild aortic valve sclerosis is present, with no evidence of  aortic valve stenosis.   FINDINGS  Left Ventricle: Left ventricular ejection fraction, by estimation, is 55  to 60%. The left ventricle has normal function. The left ventricle has no  regional wall motion abnormalities. The left ventricular internal cavity  size was normal in size. There is  moderate left ventricular hypertrophy. Left ventricular diastolic  parameters are consistent with Grade I diastolic dysfunction (impaired  relaxation).   Right Ventricle: The right ventricular size is normal. No increase in  right ventricular wall thickness. Right ventricular systolic function is  normal. There is normal pulmonary artery systolic pressure. The tricuspid  regurgitant velocity is 2.58 m/s, and  with an assumed right atrial pressure of 3 mmHg, the estimated right  ventricular systolic pressure is 58.8 mmHg.   Left Atrium: Left atrial size was mildly dilated.   Right Atrium: Right atrial size was normal in size.   Pericardium: Trivial pericardial effusion is present.   Mitral Valve: The mitral valve is abnormal. There is mild thickening of  the mitral valve leaflet(s). Moderate mitral annular calcification.  Moderate mitral valve regurgitation.   Tricuspid Valve: The tricuspid valve is normal in structure. Tricuspid  valve regurgitation is mild.   Aortic Valve: The aortic valve is tricuspid. Aortic valve regurgitation is  not visualized. Mild aortic valve sclerosis is present, with no evidence  of aortic valve stenosis.    Pulmonic Valve: The pulmonic valve was normal in structure. Pulmonic valve  regurgitation is not visualized.   Aorta: The aortic root and ascending aorta are structurally normal, with  no evidence of dilitation.   IAS/Shunts: The interatrial septum was not assessed.     LEFT VENTRICLE  PLAX 2D  LVIDd:     5.30 cm   Diastology  LVIDs:     3.60 cm   LV e' lateral:  5.22 cm/s  LV PW:     1.30 cm   LV E/e' lateral: 21.8  LV IVS:    1.20 cm   LV e' medial:  5.44 cm/s  LVOT diam:   2.10 cm   LV E/e' medial: 21.0  LV SV:     58  LV SV Index:  28  LVOT Area:   3.46 cm    LV Volumes (MOD)  LV vol d, MOD A4C: 117.0 ml  LV SV MOD A4C:   117.0 ml   RIGHT VENTRICLE  RV S prime:   13.30 cm/s  TAPSE (M-mode): 2.1 cm   LEFT ATRIUM       Index    RIGHT ATRIUM      Index  LA diam:    4.60 cm 2.18 cm/m RA Area:   15.80 cm  LA Vol (A2C):  92.8 ml 43.96 ml/m RA  Volume:  43.80 ml 20.75 ml/m  LA Vol (A4C):  96.5 ml 45.72 ml/m  LA Biplane Vol: 97.9 ml 46.38 ml/m  AORTIC VALVE  LVOT Vmax:  90.50 cm/s  LVOT Vmean: 52.600 cm/s  LVOT VTI:  0.168 m    AORTA  Ao Root diam: 3.10 cm  Ao Asc diam: 3.70 cm   MITRAL VALVE        TRICUSPID VALVE  MV Area (PHT): 4.21 cm   TV Peak grad:  13.7 mmHg  MV Decel Time: 180 msec   TV Vmax:    1.85 m/s  MV E velocity: 114.00 cm/s TR Peak grad:  26.6 mmHg  MV A velocity: 88.80 cm/s  TR Vmax:    258.00 cm/s  MV E/A ratio: 1.28               SHUNTS               Systemic VTI: 0.17 m               Systemic Diam: 2.10 cm   Antimicrobials:  Anti-infectives (From admission, onward)   None     Subjective: Seen and examined at bedside in the dialysis unit and he states he had no complaints.  No chest pain, nausea, vomiting.  Right groin was a little tender.  No lightheadedness or dizziness.  No other  concerns or complaints at this time.  Objective: Vitals:   05/15/20 1230 05/15/20 1300 05/15/20 1340 05/15/20 1552  BP: 133/62 101/64 134/65 115/89  Pulse: 60 (!) 53 67 83  Resp:   (!) 22 20  Temp:   98.6 F (37 C) (!) 97.4 F (36.3 C)  TempSrc:   Oral Oral  SpO2:    98%  Weight:   87.5 kg   Height:        Intake/Output Summary (Last 24 hours) at 05/15/2020 1711 Last data filed at 05/15/2020 1323 Gross per 24 hour  Intake 360 ml  Output 1428 ml  Net -1068 ml   Filed Weights   05/15/20 0428 05/15/20 0940 05/15/20 1340  Weight: 91.4 kg 92.4 kg 87.5 kg   Examination: Physical Exam:  Constitutional: WN/WD, overweight African-American male in no acute distress getting dialyzed and appears calm.,  Eyes: Lids and conjunctivae normal, sclerae anicteric  ENMT: External Ears, Nose appear normal. Grossly normal hearing.  Neck: Appears normal, supple, no cervical masses, normal ROM, no appreciable thyromegaly; no JVD Respiratory: Diminished to auscultation bilaterally, no wheezing, rales, rhonchi or crackles. Normal respiratory effort and patient is not tachypenic. No accessory muscle use.  Unlabored breathing Cardiovascular: Irregularly irregular, no murmurs / rubs / gallops. S1 and S2 auscultated.  Minimal extremity edema Abdomen: Soft, non-tender, non-distended. Bowel sounds positive.  GU: Deferred. Musculoskeletal: No clubbing / cyanosis of digits/nails. No joint deformity upper and lower extremities.  Left arm AV fistula currently used for dialysis Skin: No rashes, lesions, ulcers on limited skin evaluation.  Neurologic: CN 2-12 grossly intact with no focal deficits. Romberg sign and cerebellar reflexes not assessed.  Psychiatric: Normal judgment and insight. Alert and oriented x 3. Normal mood and appropriate affect.   Data Reviewed: I have personally reviewed following labs and imaging studies  CBC: Recent Labs  Lab 05/12/20 2015 05/13/20 0503 05/14/20 0518 05/15/20 0424    WBC 7.2 5.6 5.8 5.6  NEUTROABS 4.4  --  2.2  --   HGB 10.4* 10.7* 10.1* 9.8*  HCT 30.2* 31.4* 30.0* 28.4*  MCV 95.0 96.6 97.1  97.6  PLT 201 192 187 540   Basic Metabolic Panel: Recent Labs  Lab 05/12/20 2015 05/13/20 0503 05/14/20 0518 05/15/20 0424  NA 137 135 138 136  K 3.2* 4.3 4.1 4.4  CL 93* 93* 96* 93*  CO2 28 30 30 28   GLUCOSE 120* 95 89 76  BUN 22 28* 37* 52*  CREATININE 8.27* 9.41* 12.53* 14.93*  CALCIUM 7.9* 8.2* 8.2* 8.4*  MG  --   --  1.8 1.8  PHOS  --   --  6.7* 6.5*   GFR: Estimated Creatinine Clearance: 5.2 mL/min (A) (by C-G formula based on SCr of 14.93 mg/dL (H)). Liver Function Tests: Recent Labs  Lab 05/12/20 2015 05/14/20 0518 05/15/20 0424  AST 14* 35  --   ALT 9 11  --   ALKPHOS 54 52  --   BILITOT 0.9 1.0  --   PROT 7.3 6.9  --   ALBUMIN 3.2* 2.8* 2.7*   No results for input(s): LIPASE, AMYLASE in the last 168 hours. No results for input(s): AMMONIA in the last 168 hours. Coagulation Profile: Recent Labs  Lab 05/12/20 2015  INR 1.1   Cardiac Enzymes: No results for input(s): CKTOTAL, CKMB, CKMBINDEX, TROPONINI in the last 168 hours. BNP (last 3 results) No results for input(s): PROBNP in the last 8760 hours. HbA1C: Recent Labs    05/12/20 2015  HGBA1C 5.7*   CBG: Recent Labs  Lab 05/13/20 1619 05/14/20 0020 05/14/20 0828 05/14/20 1628 05/15/20 0734  GLUCAP 133* 81 89 102* 88   Lipid Profile: Recent Labs    05/12/20 2015  CHOL 170  HDL 40*  LDLCALC 113*  TRIG 84  CHOLHDL 4.3   Thyroid Function Tests: No results for input(s): TSH, T4TOTAL, FREET4, T3FREE, THYROIDAB in the last 72 hours. Anemia Panel: No results for input(s): VITAMINB12, FOLATE, FERRITIN, TIBC, IRON, RETICCTPCT in the last 72 hours. Sepsis Labs: No results for input(s): PROCALCITON, LATICACIDVEN in the last 168 hours.  Recent Results (from the past 240 hour(s))  SARS Coronavirus 2 by RT PCR (hospital order, performed in [facility] hospital  lab) Nasopharyngeal Nasopharyngeal Swab     Status: None   Collection Time: 05/12/20  7:30 PM   Specimen: Nasopharyngeal Swab  Result Value Ref Range Status   SARS Coronavirus 2 NEGATIVE NEGATIVE Final    Comment: (NOTE) SARS-CoV-2 target nucleic acids are NOT DETECTED.  The SARS-CoV-2 RNA is generally detectable in upper and lower respiratory specimens during the acute phase of infection. The lowest concentration of SARS-CoV-2 viral copies this assay can detect is 250 copies / mL. A negative result does not preclude SARS-CoV-2 infection and should not be used as the sole basis for treatment or other patient management decisions.  A negative result may occur with improper specimen collection / handling, submission of specimen other than nasopharyngeal swab, presence of viral mutation(s) within the areas targeted by this assay, and inadequate number of viral copies (<250 copies / mL). A negative result must be combined with clinical observations, patient history, and epidemiological information.  Fact Sheet for Patients:   StrictlyIdeas.no  Fact Sheet for Healthcare Providers: BankingDealers.co.za  This test is not yet approved or  cleared by the [place] FDA and has been authorized for detection and/or diagnosis of SARS-CoV-2 by FDA under an Emergency Use Authorization (EUA).  This EUA will remain in effect (meaning this test can be used) for the duration of the COVID-19 declaration under Section 564(b)(1) of the Act, 21 U.S.C.  section 360bbb-3(b)(1), unless the authorization is terminated or revoked sooner.  Performed at [facility], [place] [address], [place], [place] [address]   MRSA PCR Screening     Status: None   Collection Time: 05/13/20 12:12 PM   Specimen: Nasal Mucosa; Nasopharyngeal  Result Value Ref Range Status   MRSA by PCR NEGATIVE NEGATIVE Final    Comment:        The GeneXpert MRSA Assay (FDA approved  for NASAL specimens only), is one component of a comprehensive MRSA colonization surveillance program. It is not intended to diagnose MRSA infection nor to guide or monitor treatment for MRSA infections. Performed at [facility], [place] [address] [place], [place] [address]      RN Pressure Injury Documentation:     Estimated body mass index is 26.9 kg/m as calculated from the following:   Height as of this encounter: 5\' 11"  (1.803 m).   Weight as of this encounter: 87.5 kg.  Malnutrition Type:      Malnutrition Characteristics:      Nutrition Interventions:    Radiology Studies: CARDIAC CATHETERIZATION  Result Date: 05/14/2020 1.  Severe multivessel coronary artery disease with severe calcific distal left main stenosis, severe diffuse ostial/proximal LAD stenosis, severe stenosis of the proximal left circumflex/first OM, and interval occlusion of the distal RCA with left-to-right collaterals 2.  Heavily calcified distal abdominal aorta and bilateral iliac arteries with mild diffuse calcified disease.  No severe iliac lesions identified. Recommend ongoing medical therapy.  With total occlusion of the RCA and severe calcification of the distal left main and complex disease in the LAD and left circumflex, I think risk of protected PCI would be extremely high.  I also think his access is marginal for large bore support devices from the groin.  Scheduled Meds:  aspirin EC  81 mg Oral Daily   atorvastatin  80 mg Oral Daily   calcitRIOL  2.5 mcg Oral Q T,Th,Sa-HD   Chlorhexidine Gluconate Cloth  6 each Topical Q0600   cinacalcet  90 mg Oral Q T,Th,Sa-HD   clonazePAM  0.5 mg Oral Daily   clopidogrel  75 mg Oral QPM   isosorbide mononitrate  60 mg Oral Daily   lanthanum  2,000 mg Oral TID WC   multivitamin  1 tablet Oral QHS   sodium chloride flush  3 mL Intravenous Q12H   sodium chloride flush  3 mL Intravenous Q12H   Continuous Infusions:  sodium  chloride      LOS: 3 days   Hiram, DO [facility] PAGER is on AMION  If 7PM-7AM, please contact night-coverage [email or web address]

## 2020-05-16 ENCOUNTER — Other Ambulatory Visit: Payer: Self-pay | Admitting: Physician Assistant

## 2020-05-16 ENCOUNTER — Other Ambulatory Visit: Payer: Self-pay | Admitting: Cardiology

## 2020-05-16 ENCOUNTER — Encounter: Payer: Self-pay | Admitting: Cardiovascular Disease

## 2020-05-16 LAB — CBC WITH DIFFERENTIAL/PLATELET
Abs Immature Granulocytes: 0.03 10*3/uL (ref 0.00–0.07)
Basophils Absolute: 0.1 10*3/uL (ref 0.0–0.1)
Basophils Relative: 1 %
Eosinophils Absolute: 0.8 10*3/uL — ABNORMAL HIGH (ref 0.0–0.5)
Eosinophils Relative: 12 %
HCT: 29 % — ABNORMAL LOW (ref 39.0–52.0)
Hemoglobin: 10.1 g/dL — ABNORMAL LOW (ref 13.0–17.0)
Immature Granulocytes: 0 %
Lymphocytes Relative: 21 %
Lymphs Abs: 1.5 10*3/uL (ref 0.7–4.0)
MCH: 33.8 pg (ref 26.0–34.0)
MCHC: 34.8 g/dL (ref 30.0–36.0)
MCV: 97 fL (ref 80.0–100.0)
Monocytes Absolute: 1.1 10*3/uL — ABNORMAL HIGH (ref 0.1–1.0)
Monocytes Relative: 16 %
Neutro Abs: 3.4 10*3/uL (ref 1.7–7.7)
Neutrophils Relative %: 50 %
Platelets: 192 10*3/uL (ref 150–400)
RBC: 2.99 MIL/uL — ABNORMAL LOW (ref 4.22–5.81)
RDW: 14.9 % (ref 11.5–15.5)
WBC: 6.9 10*3/uL (ref 4.0–10.5)
nRBC: 0 % (ref 0.0–0.2)

## 2020-05-16 LAB — COMPREHENSIVE METABOLIC PANEL
ALT: 15 U/L (ref 0–44)
AST: 24 U/L (ref 15–41)
Albumin: 2.9 g/dL — ABNORMAL LOW (ref 3.5–5.0)
Alkaline Phosphatase: 54 U/L (ref 38–126)
Anion gap: 11 (ref 5–15)
BUN: 31 mg/dL — ABNORMAL HIGH (ref 8–23)
CO2: 29 mmol/L (ref 22–32)
Calcium: 8.5 mg/dL — ABNORMAL LOW (ref 8.9–10.3)
Chloride: 97 mmol/L — ABNORMAL LOW (ref 98–111)
Creatinine, Ser: 10.26 mg/dL — ABNORMAL HIGH (ref 0.61–1.24)
GFR calc Af Amer: 5 mL/min — ABNORMAL LOW (ref >60)
GFR calc non Af Amer: 5 mL/min — ABNORMAL LOW (ref >60)
Glucose, Bld: 79 mg/dL (ref 70–99)
Potassium: 4.3 mmol/L (ref 3.5–5.1)
Sodium: 137 mmol/L (ref 135–145)
Total Bilirubin: 0.7 mg/dL (ref 0.3–1.2)
Total Protein: 6.9 g/dL (ref 6.5–8.1)

## 2020-05-16 LAB — MAGNESIUM: Magnesium: 1.9 mg/dL (ref 1.7–2.4)

## 2020-05-16 LAB — PHOSPHORUS: Phosphorus: 4 mg/dL (ref 2.5–4.6)

## 2020-05-16 MED ORDER — ASPIRIN 81 MG PO TBEC: 81.0000 mg | DELAYED_RELEASE_TABLET | Freq: Every day | ORAL | 11 refills | Status: DC

## 2020-05-16 MED ORDER — LANTHANUM CARBONATE 1000 MG PO CHEW: 2000.0000 mg | CHEWABLE_TABLET | Freq: Three times a day (TID) | ORAL | 0 refills | Status: AC

## 2020-05-16 MED ORDER — CHLORHEXIDINE GLUCONATE CLOTH 2 % EX PADS: 6.0000 | MEDICATED_PAD | Freq: Every day | CUTANEOUS | Status: DC

## 2020-05-16 MED ORDER — METOPROLOL SUCCINATE ER 25 MG PO TB24: 25.0000 mg | ORAL_TABLET | ORAL | Status: DC

## 2020-05-16 MED ORDER — CINACALCET HCL 30 MG PO TABS: 90.0000 mg | ORAL_TABLET | ORAL | 0 refills | Status: AC

## 2020-05-16 MED ORDER — RENA-VITE PO TABS: 1.0000 | ORAL_TABLET | Freq: Every day | ORAL | 0 refills | Status: AC

## 2020-05-16 MED ORDER — NITROGLYCERIN 0.4 MG SL SUBL: 0.4000 mg | SUBLINGUAL_TABLET | SUBLINGUAL | 0 refills | Status: DC | PRN

## 2020-05-16 MED ORDER — METOPROLOL SUCCINATE ER 25 MG PO TB24
25.0000 mg | ORAL_TABLET | ORAL | Status: DC
  Administered 2020-05-16: 25 mg via ORAL
  Filled 2020-05-16: qty 1

## 2020-05-16 MED ORDER — CALCITRIOL 0.5 MCG PO CAPS: 2.5000 ug | ORAL_CAPSULE | ORAL | 0 refills | Status: AC

## 2020-05-16 MED ORDER — METOPROLOL SUCCINATE ER 25 MG PO TB24: 25.0000 mg | ORAL_TABLET | ORAL | 0 refills | Status: DC

## 2020-05-16 MED ORDER — METOPROLOL SUCCINATE ER 25 MG PO TB24: 25.0000 mg | ORAL_TABLET | Freq: Every day | ORAL | Status: DC

## 2020-05-16 MED ORDER — ISOSORBIDE MONONITRATE ER 60 MG PO TB24: 60.0000 mg | ORAL_TABLET | Freq: Every day | ORAL | Status: DC

## 2020-05-16 NOTE — Progress Notes (Signed)
Report from previous RN  today. Taking over patient care at this time  Patient resting in bed comfortably anxiously waiting to go home

## 2020-05-16 NOTE — Progress Notes (Addendum)
Patient was discharged home by MD order; discharged instructions  review and give to patient with care notes and prescriptions; IV DIC; skin intact; patient will be escorted to his cab by nurse tech via wheelchair.  Patient has all belongings upon leaving.    Explained all discharge instructions to patient and answered any questions he had.  Patient advised he understood all instructions clearly.

## 2020-05-16 NOTE — Care Management (Signed)
[id number] 05-16-20 [employer] scheduled for patients transport home. Scheduled pick up for 1817 pm. Daughter aware that patient will travel home via [place]. Daughter will be home to accept the patient.  No further needs from Case Manager at this time. Bethena Roys, RN,BSN Case Manager

## 2020-05-16 NOTE — Progress Notes (Signed)
    Patient was seen and evaluated this morning.  We are still awaiting the results of the arterial Dopplers from yesterday.  Groin seems to be stable.  Not overly sore.  I suspect that this study should be read today.  As long as there is no evidence of pseudoaneurysm, and also the reason why he cannot be discharged based on yesterday's plan.  I have written for the Toprol already.  We will monitor for results.   [facility] will sign off.   Medication Recommendations: Medication adjustments as made, Toprol 25 mg every morning (with exception of HD days) Other recommendations (labs, testing, etc): Pending results of right groin arterial Doppler to exclude pseudoaneurysm (if no evidence of pseudoaneurysm, okay for discharge Follow up as an outpatient: Will be arranged with either Dr. [name] or APP. -->  Will need to show ability to show evidence of medical adherence in addition to returning for follow-up appointments. Will defer to Dr. [name] as to whether or not reconsider rereferral for CABG consult versus determining the need for considering high risk PCI (chest)

## 2020-05-16 NOTE — Progress Notes (Signed)
   Lower extremity arterial ultrasound was actually read last night but there was a technical error and it is not coming through on Epic. However, it showed no evidence of pseudoaneurysm, AVR, or DVT. Therefore, patient OK for discharge from Cardiology standpoint. Outpatient follow-up has been arranged. Dr. Ellyn Hack added Toprol-XL 25mg  on non-dialysis days. Will update primary team.  Darreld Mclean, PA-C 05/16/2020 4:35 PM

## 2020-05-16 NOTE — Telephone Encounter (Signed)
error 

## 2020-05-16 NOTE — Care Management Important Message (Signed)
Important Message  Patient Details  Name: Kenneth [name] MRN: [id number] Date of Birth: 07-05-1956   Medicare Important Message Given:  Yes     Shelda Altes 05/16/2020, 11:43 AM

## 2020-05-16 NOTE — Telephone Encounter (Signed)
*  STAT* If patient is at the pharmacy, call can be transferred to refill team.   1. Which medications need to be refilled? (please list name of each medication and dose if known) clopidogrel (PLAVIX) 75 MG tablet  2. Which pharmacy/location (including street and city if local pharmacy) is medication to be sent to? [place], [place]  3. Do they need a 30 day or 90 day supply? 30  Patient is out of medication

## 2020-05-16 NOTE — Progress Notes (Signed)
[facility] Progress Note  Name: Kenneth [name] MRN: [id number] DOB: 1956-04-20  Chief Complaint:  Chest pain  Subjective:  had HD on 7/20 with 1.4 kg UF.  Feels well this AM and states HD went well.  States that if his test today comes back ok he's been told he will likely go home.  Review of systems:  Denies shortness of breath or chest pain  Denies n/v   Intake/Output Summary (Last 24 hours) at 05/16/2020 1042 Last data filed at 05/15/2020 1700 Gross per 24 hour  Intake 600 ml  Output 1428 ml  Net -828 ml    Vitals:  Vitals:   05/15/20 1340 05/15/20 1552 05/15/20 1920 05/16/20 0555  BP: 134/65 115/89 (!) 146/73 (!) 150/90  Pulse: 67 83 79 79  Resp: (!) 22 20  18   Temp: 98.6 F (37 C) (!) 97.4 F (36.3 C) 98 F (36.7 C) 98 F (36.7 C)  TempSrc: Oral Oral Oral Oral  SpO2:  98% 98% 95%  Weight: 87.5 kg   86.9 kg  Height:         Physical Exam:  General adult male in bed in no acute distress HEENT normocephalic atraumatic extraocular movements intact sclera anicteric Neck supple trachea midline Lungs clear to auscultation bilaterally normal work of breathing at rest  Heart S1S2 no rub Abdomen soft nontender nondistended Extremities no pitting edema  Psych normal mood and affect Neuro - alert and oriented x 3 provides hx and follows commands L AVF with bruit/thrill   Medications reviewed   Labs:  BMP Latest Ref Rng & Units 05/16/2020 05/15/2020 05/14/2020  Glucose 70 - 99 mg/dL 79 76 89  BUN 8 - 23 mg/dL 31(H) 52(H) 37(H)  Creatinine 0.61 - 1.24 mg/dL 10.26(H) 14.93(H) 12.53(H)  Sodium 135 - 145 mmol/L 137 136 138  Potassium 3.5 - 5.1 mmol/L 4.3 4.4 4.1  Chloride 98 - 111 mmol/L 97(L) 93(L) 96(L)  CO2 22 - 32 mmol/L 29 28 30   Calcium 8.9 - 10.3 mg/dL 8.5(L) 8.4(L) 8.2(L)     Assessment/Plan:   NSTEMI - s/p cath and severe multivessel dz with medical therapy at this time  ESRD on HD TTS   HTN - continue current regimen and optimize  volume with HD   Anemia ESRD - no acute indication for ESA - defer for now    Metabolic bone disease - on calcitriol with HD and sensipar  Hx afib/flutter - noted above hx of noncompliance with medications; in NSR  EtOH abuse - per primary team   Disposition per primary team  Claudia Desanctis, MD 05/16/2020 10:57 AM

## 2020-05-17 ENCOUNTER — Other Ambulatory Visit: Payer: Self-pay

## 2020-05-17 DIAGNOSIS — N2581 Secondary hyperparathyroidism of renal origin: Secondary | ICD-10-CM | POA: Diagnosis not present

## 2020-05-17 DIAGNOSIS — Z992 Dependence on renal dialysis: Secondary | ICD-10-CM | POA: Diagnosis not present

## 2020-05-17 DIAGNOSIS — D509 Iron deficiency anemia, unspecified: Secondary | ICD-10-CM | POA: Diagnosis not present

## 2020-05-17 DIAGNOSIS — N186 End stage renal disease: Secondary | ICD-10-CM | POA: Diagnosis not present

## 2020-05-17 NOTE — Discharge Summary (Signed)
Discharge Summary  Kenneth [name] XFG:[id number] DOB: 12-Jul-1956  PCP: [name], MD  Admit date: 05/12/2020 Discharge date: 05/17/2020  Time spent: 40 mins  Recommendations for Outpatient Follow-up:  1. Follow-up with PCP 2. Follow-up with cardiology  Discharge Diagnoses:  Active Hospital Problems   Diagnosis Date Noted  . NSTEMI (non-ST elevated myocardial infarction) ([place]) 05/12/2020  . Non-ST elevated myocardial infarction ([place]) 05/12/2020  . Atrial fibrillation and flutter ([place])   . ESRD on hemodialysis ([place]) 10/08/2016  . Essential hypertension 10/09/2011    Resolved Hospital Problems  No resolved problems to display.    Discharge Condition: Stable  Diet recommendation: Heart healthy  Vitals:   05/16/20 0555 05/16/20 1517  BP: (!) 150/90 (!) 156/77  Pulse: 79 74  Resp: 18 18  Temp: 98 F (36.7 C) 97.8 F (36.6 C)  SpO2: 95% 99%    History of present illness:  Kenneth Graff Jonesis a 64 y.o.malewithhistory of severe coronary artery disease medically managed not a candidate for intervention, alcohol abuse A. fib not on anticoagulation candidate ESRD on hemodialysis hypertension hyperlipidemia who presents to the ER after patient started developing chest pain while having dialysis. Pain was retrosternal nonradiating pressure-like associated nausea vomiting. No fever chills or productive cough. In the ER patient was found to be hypotensive with blood pressure systolic in the 86 was given 500 cc normal saline bolus and Zofran. Abdomen appears benign on exam chest x-ray unremarkable. EKG was showing initially ST elevation the inferolateral leads which actually resolved with blood pressure improving. A code STEMI was called and Dr. Ellyn Hack and Dr. Aktharcardiologist had seen the patient and since patient's dysuria patient has resolved at this time they are planning medical management. Labs are remarkable for high sensitive troponin of 71 and 387 chest x-ray  unremarkable hemoglobin 10.4. Covid test was negative.  Patient admitted for further management.    Patient denied any new complaints.  Stable to be discharged with close follow-up.  Hospital Course:  Principal Problem:   NSTEMI (non-ST elevated myocardial infarction) [facility]) Active Problems:   Essential hypertension   ESRD on hemodialysis ([facility])   Atrial fibrillation and flutter (H[facility])   Non-ST elevated myocardial infarction ([place])   NSTEMI Troponin noted to trend upwards EKG initially showed inferior ST elevations, which resolved Echo showed EF of 55 to 60%, with normal regional wall motion, grade 1 diastolic dysfunction Patient had cardiac cath which showed severe multivessel coronary artery disease with severe calcific distal left main stenosis, severe diffuse ostial/proximal LAD stenosis, severe stenosis of the proximal left circumflex/first OM, and interval occlusion of the distal RCA with left-to-right collaterals 2. Heavily calcified distal abdominal aorta and bilateral iliac arteries with mild diffuse calcified disease. No severe iliac lesions identified." Cardiology recommended medical management, continue DAPT with aspirin and Plavix, statin, beta-blocker on nondialysis days Cardiology did note that he did have a hematoma at the site of his cath, lower extremity arterial ultrasound with no evidence of pseudoaneurysm, AVR or DVT Outpatient follow-up  A. Fib presently rate controlled.  Not a candidate for Long Term anticoagulation per cardiology Continue BB on nondialysis days  ESRD on hemodialysis on Tuesday Thursdays and Saturday.  Follow-up with nephrology/dialysis  Chronic combined systolic and diastolic heart failure/cardiomyopathy likely related to hypertensive heart disease and alcoholism Echo as above Volume management per dialysis  Hypertension Continue Imdur, BB on nondialysis days  Anemia likely from renal disease Remained stable at baseline Follow-up  with nephrology, PCP  Prediabetes A1c is 5.7  Follow-up with primary care  History of alcohol abuse  Advised to quit         Malnutrition Type:      Malnutrition Characteristics:      Nutrition Interventions:      Estimated body mass index is 26.73 kg/m as calculated from the following:   Height as of this encounter: 5\' 11"  (1.803 m).   Weight as of this encounter: 86.9 kg.    Procedures:  Cardiac cath  Consultations:  Cardiology  Nephrology  Discharge Exam: BP (!) 156/77 (BP Location: Right Arm)   Pulse 74   Temp 97.8 F (36.6 C) (Oral)   Resp 18   Ht 5\' 11"  (1.803 m)   Wt 86.9 kg   SpO2 99%   BMI 26.73 kg/m   General: NAD Cardiovascular: S1, S2 present Respiratory: Diminished breath sounds bilaterally  Discharge Instructions You were cared for by a hospitalist during your hospital stay. If you have any questions about your discharge medications or the care you received while you were in the hospital after you are discharged, you can call the unit and asked to speak with the hospitalist on call if the hospitalist that took care of you is not available. Once you are discharged, your primary care physician will handle any further medical issues. Please note that NO REFILLS for any discharge medications will be authorized once you are discharged, as it is imperative that you return to your primary care physician (or establish a relationship with a primary care physician if you do not have one) for your aftercare needs so that they can reassess your need for medications and monitor your lab values.  Discharge Instructions    Diet - low sodium heart healthy   Complete by: As directed    Increase activity slowly   Complete by: As directed      Allergies as of 05/16/2020   No Known Allergies     Medication List    STOP taking these medications   cyclobenzaprine 10 MG tablet Commonly known as: FLEXERIL   ibuprofen 800 MG tablet Commonly known  as: ADVIL   methylPREDNISolone 4 MG Tbpk tablet Commonly known as: MEDROL DOSEPAK   oxyCODONE 5 MG immediate release tablet Commonly known as: Oxy IR/ROXICODONE     TAKE these medications   aspirin 81 MG EC tablet Take 1 tablet (81 mg total) by mouth daily. Swallow whole.   atorvastatin 80 MG tablet Commonly known as: LIPITOR Take 1 tablet (80 mg total) by mouth daily.   calcitRIOL 0.5 MCG capsule Commonly known as: ROCALTROL Take 5 capsules (2.5 mcg total) by mouth Every Tuesday,Thursday,and Saturday with dialysis.   cinacalcet 30 MG tablet Commonly known as: SENSIPAR Take 3 tablets (90 mg total) by mouth Every Tuesday,Thursday,and Saturday with dialysis.   clonazePAM 0.5 MG tablet Commonly known as: KLONOPIN Take 0.5 mg by mouth daily.   isosorbide mononitrate 60 MG 24 hr tablet Commonly known as: IMDUR Take 1 tablet (60 mg total) by mouth daily.   lanthanum 1000 MG chewable tablet Commonly known as: FOSRENOL Chew 2 tablets (2,000 mg total) by mouth 3 (three) times daily with meals.   metoprolol succinate 25 MG 24 hr tablet Commonly known as: TOPROL-XL Take 1 tablet (25 mg total) by mouth 4 (four) times a week. Take on Sunday, Monday, Wednesday, Friday   multivitamin Tabs tablet Take 1 tablet by mouth at bedtime.   nitroGLYCERIN 0.4 MG SL tablet Commonly known as: NITROSTAT Place 1 tablet (0.4  mg total) under the tongue every 5 (five) minutes x 3 doses as needed for chest pain.   Tylenol PM Extra Strength 25-500 MG Tabs tablet Generic drug: diphenhydramine-acetaminophen Take 2 tablets by mouth at bedtime as needed (Pain, Sleep).      No Known Allergies  Follow-up Information    [name], MD. Schedule an appointment as soon as possible for a visit in 1 week(s).   Specialty: Family Medicine Contact information: [place] [address] [phone]        Deberah Pelton, NP Follow up.   Specialty: Cardiology Why:  Hospital follow-up visit scheduled for 06/05/2020 at 9:15am with Coletta Memos, one of Dr. [name] NPs. Please arrive 15 minutes early for check-in. If this date/time does not work for you, please call our office to reschedule. Contact information: [address] [place] [address] [phone]                The results of significant diagnostics from this hospitalization (including imaging, microbiology, ancillary and laboratory) are listed below for reference.    Significant Diagnostic Studies: CARDIAC CATHETERIZATION  Result Date: 05/14/2020 1.  Severe multivessel coronary artery disease with severe calcific distal left main stenosis, severe diffuse ostial/proximal LAD stenosis, severe stenosis of the proximal left circumflex/first OM, and interval occlusion of the distal RCA with left-to-right collaterals 2.  Heavily calcified distal abdominal aorta and bilateral iliac arteries with mild diffuse calcified disease.  No severe iliac lesions identified. Recommend ongoing medical therapy.  With total occlusion of the RCA and severe calcification of the distal left main and complex disease in the LAD and left circumflex, I think risk of protected PCI would be extremely high.  I also think his access is marginal for large bore support devices from the groin.  DG Chest Port 1 View  Result Date: 05/12/2020 CLINICAL DATA:  CP EXAM: PORTABLE CHEST 1 VIEW COMPARISON:  Chest radiograph 06/20/2019 FINDINGS: Interval removal of a right central venous catheter. Stable cardiomediastinal contours with enlarged heart size. Aortic arch calcification. There is diffuse bilateral coarsening of the interstitium which likely represents chronic bronchitic change. No focal consolidation. No pneumothorax or large pleural effusion. No acute finding in the visualized skeleton. IMPRESSION: Cardiomegaly. Diffuse coarsening of the interstitium likely chronic bronchitic change. Electronically Signed   By:  Audie Pinto M.D.   On: 05/12/2020 19:51   ECHOCARDIOGRAM COMPLETE  Result Date: 05/13/2020    ECHOCARDIOGRAM REPORT   Patient Name:   WAYBURN SHALER Date of Exam: 05/13/2020 Medical Rec #:  [id number]      Height:       71.0 in Accession #:    [id number]     Weight:       200.5 lb Date of Birth:  12/18/1955      BSA:          2.111 m Patient Age:    89 years       BP:           81/69 mmHg Patient Gender: M              HR:           71 bpm. Exam Location:  Inpatient Procedure: 2D Echo Indications:    chest pain 786.50  History:        Patient has prior history of Echocardiogram examinations, most  recent 09/18/2018. End stage renal disease. NSTEMI.,                 Arrythmias:Atrial Fibrillation; Risk Factors:Hypertension.  Sonographer:    Johny Chess Referring Phys: [id number] Baring  1. Left ventricular ejection fraction, by estimation, is 55 to 60%. The left ventricle has normal function. The left ventricle has no regional wall motion abnormalities. There is moderate left ventricular hypertrophy. Left ventricular diastolic parameters are consistent with Grade I diastolic dysfunction (impaired relaxation).  2. Right ventricular systolic function is normal. The right ventricular size is normal. There is normal pulmonary artery systolic pressure.  3. Left atrial size was mildly dilated.  4. The mitral valve is abnormal. Moderate mitral valve regurgitation.  5. The aortic valve is tricuspid. Aortic valve regurgitation is not visualized. Mild aortic valve sclerosis is present, with no evidence of aortic valve stenosis. FINDINGS  Left Ventricle: Left ventricular ejection fraction, by estimation, is 55 to 60%. The left ventricle has normal function. The left ventricle has no regional wall motion abnormalities. The left ventricular internal cavity size was normal in size. There is  moderate left ventricular hypertrophy. Left ventricular diastolic parameters are consistent  with Grade I diastolic dysfunction (impaired relaxation). Right Ventricle: The right ventricular size is normal. No increase in right ventricular wall thickness. Right ventricular systolic function is normal. There is normal pulmonary artery systolic pressure. The tricuspid regurgitant velocity is 2.58 m/s, and  with an assumed right atrial pressure of 3 mmHg, the estimated right ventricular systolic pressure is 49.4 mmHg. Left Atrium: Left atrial size was mildly dilated. Right Atrium: Right atrial size was normal in size. Pericardium: Trivial pericardial effusion is present. Mitral Valve: The mitral valve is abnormal. There is mild thickening of the mitral valve leaflet(s). Moderate mitral annular calcification. Moderate mitral valve regurgitation. Tricuspid Valve: The tricuspid valve is normal in structure. Tricuspid valve regurgitation is mild. Aortic Valve: The aortic valve is tricuspid. Aortic valve regurgitation is not visualized. Mild aortic valve sclerosis is present, with no evidence of aortic valve stenosis. Pulmonic Valve: The pulmonic valve was normal in structure. Pulmonic valve regurgitation is not visualized. Aorta: The aortic root and ascending aorta are structurally normal, with no evidence of dilitation. IAS/Shunts: The interatrial septum was not assessed.  LEFT VENTRICLE PLAX 2D LVIDd:         5.30 cm      Diastology LVIDs:         3.60 cm      LV e' lateral:   5.22 cm/s LV PW:         1.30 cm      LV E/e' lateral: 21.8 LV IVS:        1.20 cm      LV e' medial:    5.44 cm/s LVOT diam:     2.10 cm      LV E/e' medial:  21.0 LV SV:         58 LV SV Index:   28 LVOT Area:     3.46 cm  LV Volumes (MOD) LV vol d, MOD A4C: 117.0 ml LV SV MOD A4C:     117.0 ml RIGHT VENTRICLE RV S prime:     13.30 cm/s TAPSE (M-mode): 2.1 cm LEFT ATRIUM             Index       RIGHT ATRIUM           Index LA diam:  4.60 cm 2.18 cm/m  RA Area:     15.80 cm LA Vol (A2C):   92.8 ml 43.96 ml/m RA Volume:   43.80 ml   20.75 ml/m LA Vol (A4C):   96.5 ml 45.72 ml/m LA Biplane Vol: 97.9 ml 46.38 ml/m  AORTIC VALVE LVOT Vmax:   90.50 cm/s LVOT Vmean:  52.600 cm/s LVOT VTI:    0.168 m  AORTA Ao Root diam: 3.10 cm Ao Asc diam:  3.70 cm MITRAL VALVE                TRICUSPID VALVE MV Area (PHT): 4.21 cm     TV Peak grad:   13.7 mmHg MV Decel Time: 180 msec     TV Vmax:        1.85 m/s MV E velocity: 114.00 cm/s  TR Peak grad:   26.6 mmHg MV A velocity: 88.80 cm/s   TR Vmax:        258.00 cm/s MV E/A ratio:  1.28                             SHUNTS                             Systemic VTI:  0.17 m                             Systemic Diam: 2.10 cm Dorris Carnes MD Electronically signed by Dorris Carnes MD Signature Date/Time: 05/13/2020/2:09:56 PM    Final    VAS [place] LOWER EXTREMITY ARTERIAL DUPLEX  Result Date: 05/17/2020  ARTERIAL PSEUDOANEURYSM  Exam: Right groin Indications: Patient complains of groin pain and bruising. History: S/p catheterization. Comparison Study: No prior studies. Performing Technologist: Darlin Coco Supporting Technologist: Abram Sander RVS  Examination Guidelines: A complete evaluation includes B-mode imaging, spectral Doppler, color Doppler, and power Doppler as needed of all accessible portions of each vessel. Bilateral testing is considered an integral part of a complete examination. Limited examinations for reoccurring indications may be performed as noted. +------------+----------+---------+------+----------+ Right DuplexPSV (cm/s)Waveform PlaqueComment(s) +------------+----------+---------+------+----------+ CFA            145    triphasic                 +------------+----------+---------+------+----------+ PFA                   triphasic                 +------------+----------+---------+------+----------+  Summary: No evidence of pseudoaneurysm, AVF or DVT  Diagnosing physician: Servando Snare MD Electronically signed by Servando Snare MD on 05/15/2020 at 5:30:02 PM.    --------------------------------------------------------------------------------    Final (Updated)     Microbiology: Recent Results (from the past 240 hour(s))  SARS Coronavirus 2 by RT PCR (hospital order, performed in [facility] hospital lab) Nasopharyngeal Nasopharyngeal Swab     Status: None   Collection Time: 05/12/20  7:30 PM   Specimen: Nasopharyngeal Swab  Result Value Ref Range Status   SARS Coronavirus 2 NEGATIVE NEGATIVE Final    Comment: (NOTE) SARS-CoV-2 target nucleic acids are NOT DETECTED.  The SARS-CoV-2 RNA is generally detectable in upper and lower respiratory specimens during the acute phase of infection. The lowest concentration of SARS-CoV-2 viral copies this assay can detect is 250 copies / mL. A negative result  does not preclude SARS-CoV-2 infection and should not be used as the sole basis for treatment or other patient management decisions.  A negative result may occur with improper specimen collection / handling, submission of specimen other than nasopharyngeal swab, presence of viral mutation(s) within the areas targeted by this assay, and inadequate number of viral copies (<250 copies / mL). A negative result must be combined with clinical observations, patient history, and epidemiological information.  Fact Sheet for Patients:   StrictlyIdeas.no  Fact Sheet for Healthcare Providers: BankingDealers.co.za  This test is not yet approved or  cleared by the [place] FDA and has been authorized for detection and/or diagnosis of SARS-CoV-2 by FDA under an Emergency Use Authorization (EUA).  This EUA will remain in effect (meaning this test can be used) for the duration of the COVID-19 declaration under Section 564(b)(1) of the Act, 21 U.S.C. section 360bbb-3(b)(1), unless the authorization is terminated or revoked sooner.  Performed at [facility], [place] [address], [place], [place] [address]     MRSA PCR Screening     Status: None   Collection Time: 05/13/20 12:12 PM   Specimen: Nasal Mucosa; Nasopharyngeal  Result Value Ref Range Status   MRSA by PCR NEGATIVE NEGATIVE Final    Comment:        The GeneXpert MRSA Assay (FDA approved for NASAL specimens only), is one component of a comprehensive MRSA colonization surveillance program. It is not intended to diagnose MRSA infection nor to guide or monitor treatment for MRSA infections. Performed at [facility], [place] [address], [place], [place] [address]      Labs: Basic Metabolic Panel: Recent Labs  Lab 05/12/20 2015 05/13/20 0503 05/14/20 0518 05/15/20 0424 05/16/20 0615  NA 137 135 138 136 137  K 3.2* 4.3 4.1 4.4 4.3  CL 93* 93* 96* 93* 97*  CO2 28 30 30 28 29   GLUCOSE 120* 95 89 76 79  BUN 22 28* 37* 52* 31*  CREATININE 8.27* 9.41* 12.53* 14.93* 10.26*  CALCIUM 7.9* 8.2* 8.2* 8.4* 8.5*  MG  --   --  1.8 1.8 1.9  PHOS  --   --  6.7* 6.5* 4.0   Liver Function Tests: Recent Labs  Lab 05/12/20 2015 05/14/20 0518 05/15/20 0424 05/16/20 0615  AST 14* 35  --  24  ALT 9 11  --  15  ALKPHOS 54 52  --  54  BILITOT 0.9 1.0  --  0.7  PROT 7.3 6.9  --  6.9  ALBUMIN 3.2* 2.8* 2.7* 2.9*   No results for input(s): LIPASE, AMYLASE in the last 168 hours. No results for input(s): AMMONIA in the last 168 hours. CBC: Recent Labs  Lab 05/12/20 2015 05/13/20 0503 05/14/20 0518 05/15/20 0424 05/16/20 0615  WBC 7.2 5.6 5.8 5.6 6.9  NEUTROABS 4.4  --  2.2  --  3.4  HGB 10.4* 10.7* 10.1* 9.8* 10.1*  HCT 30.2* 31.4* 30.0* 28.4* 29.0*  MCV 95.0 96.6 97.1 97.6 97.0  PLT 201 192 187 192 192   Cardiac Enzymes: No results for input(s): CKTOTAL, CKMB, CKMBINDEX, TROPONINI in the last 168 hours. BNP: BNP (last 3 results) No results for input(s): BNP in the last 8760 hours.  ProBNP (last 3 results) No results for input(s): PROBNP in the last 8760 hours.  CBG: Recent Labs  Lab 05/13/20 1619 05/14/20 0020  05/14/20 0828 05/14/20 1628 05/15/20 0734  GLUCAP 133* 81 89 102* 88  Signed:  Alma Friendly, MD [facility] 05/17/2020, 6:37 PM

## 2020-05-18 ENCOUNTER — Telehealth: Payer: Self-pay | Admitting: Family Medicine

## 2020-05-18 NOTE — Telephone Encounter (Signed)
Called and left voicemail for patient so that I could follow up wit him from his recent hospitalization

## 2020-05-19 DIAGNOSIS — Z992 Dependence on renal dialysis: Secondary | ICD-10-CM | POA: Diagnosis not present

## 2020-05-19 DIAGNOSIS — D509 Iron deficiency anemia, unspecified: Secondary | ICD-10-CM | POA: Diagnosis not present

## 2020-05-19 DIAGNOSIS — N2581 Secondary hyperparathyroidism of renal origin: Secondary | ICD-10-CM | POA: Diagnosis not present

## 2020-05-19 DIAGNOSIS — N186 End stage renal disease: Secondary | ICD-10-CM | POA: Diagnosis not present

## 2020-05-22 DIAGNOSIS — Z992 Dependence on renal dialysis: Secondary | ICD-10-CM | POA: Diagnosis not present

## 2020-05-22 DIAGNOSIS — D509 Iron deficiency anemia, unspecified: Secondary | ICD-10-CM | POA: Diagnosis not present

## 2020-05-22 DIAGNOSIS — N186 End stage renal disease: Secondary | ICD-10-CM | POA: Diagnosis not present

## 2020-05-22 DIAGNOSIS — N2581 Secondary hyperparathyroidism of renal origin: Secondary | ICD-10-CM | POA: Diagnosis not present

## 2020-05-24 DIAGNOSIS — N186 End stage renal disease: Secondary | ICD-10-CM | POA: Diagnosis not present

## 2020-05-24 DIAGNOSIS — Z992 Dependence on renal dialysis: Secondary | ICD-10-CM | POA: Diagnosis not present

## 2020-05-24 DIAGNOSIS — I4891 Unspecified atrial fibrillation: Secondary | ICD-10-CM | POA: Diagnosis not present

## 2020-05-24 DIAGNOSIS — I214 Non-ST elevation (NSTEMI) myocardial infarction: Secondary | ICD-10-CM | POA: Diagnosis not present

## 2020-05-24 DIAGNOSIS — I5040 Unspecified combined systolic (congestive) and diastolic (congestive) heart failure: Secondary | ICD-10-CM | POA: Diagnosis not present

## 2020-05-24 DIAGNOSIS — N2581 Secondary hyperparathyroidism of renal origin: Secondary | ICD-10-CM | POA: Diagnosis not present

## 2020-05-24 DIAGNOSIS — D509 Iron deficiency anemia, unspecified: Secondary | ICD-10-CM | POA: Diagnosis not present

## 2020-05-26 DIAGNOSIS — D509 Iron deficiency anemia, unspecified: Secondary | ICD-10-CM | POA: Diagnosis not present

## 2020-05-26 DIAGNOSIS — Z992 Dependence on renal dialysis: Secondary | ICD-10-CM | POA: Diagnosis not present

## 2020-05-26 DIAGNOSIS — N2581 Secondary hyperparathyroidism of renal origin: Secondary | ICD-10-CM | POA: Diagnosis not present

## 2020-05-26 DIAGNOSIS — N186 End stage renal disease: Secondary | ICD-10-CM | POA: Diagnosis not present

## 2020-05-27 DIAGNOSIS — I129 Hypertensive chronic kidney disease with stage 1 through stage 4 chronic kidney disease, or unspecified chronic kidney disease: Secondary | ICD-10-CM | POA: Diagnosis not present

## 2020-05-27 DIAGNOSIS — N186 End stage renal disease: Secondary | ICD-10-CM | POA: Diagnosis not present

## 2020-05-27 DIAGNOSIS — Z992 Dependence on renal dialysis: Secondary | ICD-10-CM | POA: Diagnosis not present

## 2020-05-29 DIAGNOSIS — N2581 Secondary hyperparathyroidism of renal origin: Secondary | ICD-10-CM | POA: Diagnosis not present

## 2020-05-29 DIAGNOSIS — D631 Anemia in chronic kidney disease: Secondary | ICD-10-CM | POA: Diagnosis not present

## 2020-05-29 DIAGNOSIS — N186 End stage renal disease: Secondary | ICD-10-CM | POA: Diagnosis not present

## 2020-05-29 DIAGNOSIS — Z992 Dependence on renal dialysis: Secondary | ICD-10-CM | POA: Diagnosis not present

## 2020-05-29 DIAGNOSIS — D509 Iron deficiency anemia, unspecified: Secondary | ICD-10-CM | POA: Diagnosis not present

## 2020-05-31 DIAGNOSIS — N186 End stage renal disease: Secondary | ICD-10-CM | POA: Diagnosis not present

## 2020-05-31 DIAGNOSIS — N2581 Secondary hyperparathyroidism of renal origin: Secondary | ICD-10-CM | POA: Diagnosis not present

## 2020-05-31 DIAGNOSIS — D631 Anemia in chronic kidney disease: Secondary | ICD-10-CM | POA: Diagnosis not present

## 2020-05-31 DIAGNOSIS — D509 Iron deficiency anemia, unspecified: Secondary | ICD-10-CM | POA: Diagnosis not present

## 2020-05-31 DIAGNOSIS — Z992 Dependence on renal dialysis: Secondary | ICD-10-CM | POA: Diagnosis not present

## 2020-06-02 DIAGNOSIS — N186 End stage renal disease: Secondary | ICD-10-CM | POA: Diagnosis not present

## 2020-06-02 DIAGNOSIS — N2581 Secondary hyperparathyroidism of renal origin: Secondary | ICD-10-CM | POA: Diagnosis not present

## 2020-06-02 DIAGNOSIS — D509 Iron deficiency anemia, unspecified: Secondary | ICD-10-CM | POA: Diagnosis not present

## 2020-06-02 DIAGNOSIS — D631 Anemia in chronic kidney disease: Secondary | ICD-10-CM | POA: Diagnosis not present

## 2020-06-02 DIAGNOSIS — Z992 Dependence on renal dialysis: Secondary | ICD-10-CM | POA: Diagnosis not present

## 2020-06-04 NOTE — Progress Notes (Deleted)
Cardiology Clinic Note   Patient Name: Kenneth [name] Date of Encounter: 06/04/2020  Primary Care Provider:  [name], MD Primary Cardiologist:  Peter [name], MD  Patient Profile    ***  Past Medical History    Past Medical History:  Diagnosis Date  . Acute CHF ([place]) 01/2018  . Cardiomyopathy secondary    likely related to HTN heart disease; possibly ETOH related as well  . Chronic combined systolic and diastolic heart failure (HCC)    Echocardiogram 09/22/11: Moderate LVH, EF 78-58%, grade 3 diastolic dysfunction, mild MR, moderate to severe LAE, mild RVE, mild to moderate TR, small to moderate pericardial effusion  . Coronary artery disease    not felt to be candidate for CABG 08/2018; medical thearpy recomended due to high risk of PCI   . Dysrhythmia    aflutter 04/2016, afib 02/2018, not felt to be a candidate for anticoagulation due to non-compliance and ETOH  . ESRD (end stage renal disease) on dialysis ([place])    due to hypertensive nephrosclerosis; TTS; [address] (09/01/2018)  . History of alcohol abuse   . Hypertension   . Myocardial infarction [facility])    " mild " per daughter  . PEA (Pulseless electrical activity) ([place])    PEA arrest 03/09/18, treated empirically for hyperkalemia, shock x1 for WCT, given amiodarone, ROSC after 10 min of ACLS   Past Surgical History:  Procedure Laterality Date  . AV FISTULA PLACEMENT Left 05/14/2016   Procedure: LEFT ARM BASILIC VEIN TRANSPOSITION;  Surgeon: Rosetta Posner, MD;  Location: [place];  Service: Vascular;  Laterality: Left;  . INSERTION OF DIALYSIS CATHETER N/A 06/20/2019   Procedure: INSERTION OF TUNNELED  DIALYSIS CATHETER;  Surgeon: Elam Dutch, MD;  Location: [place];  Service: Vascular;  Laterality: N/A;  . LEFT HEART CATH AND CORONARY ANGIOGRAPHY N/A 09/02/2018   Procedure: LEFT HEART CATH AND CORONARY ANGIOGRAPHY;  Surgeon: Nelva Bush, MD;  Location: [place] CV LAB;  Service: Cardiovascular;  Laterality:  N/A;  . LEFT HEART CATH AND CORONARY ANGIOGRAPHY N/A 05/14/2020   Procedure: LEFT HEART CATH AND CORONARY ANGIOGRAPHY;  Surgeon: Sherren Mocha, MD;  Location: [place] CV [facility];  Service: Cardiovascular;  Laterality: N/A;  . PERIPHERAL VASCULAR CATHETERIZATION N/A 05/13/2016   Procedure: Dialysis/Perma Catheter Insertion;  Surgeon: Serafina Mitchell, MD;  Location: [place] CV LAB;  Service: Cardiovascular;  Laterality: N/A;  . REVISION OF ARTERIOVENOUS GORETEX GRAFT Left 8/50/2774   Procedure: PLICATION OF ARTERIOVENOUS FISTULA LEFT ARM;  Surgeon: Elam Dutch, MD;  Location: [place];  Service: Vascular;  Laterality: Left;    Allergies  No Known Allergies  History of Present Illness    ***  Home Medications    Prior to Admission medications   Medication Sig Start Date End Date Taking? Authorizing Provider  aspirin EC 81 MG EC tablet Take 1 tablet (81 mg total) by mouth daily. Swallow whole. 05/16/20   Alma Friendly, MD  atorvastatin (LIPITOR) 80 MG tablet Take 1 tablet (80 mg total) by mouth daily. 03/16/20   [name], [name], MD  calcitRIOL (ROCALTROL) 0.5 MCG capsule Take 5 capsules (2.5 mcg total) by mouth Every Tuesday,Thursday,and Saturday with dialysis. 05/17/20 06/16/20  Alma Friendly, MD  cinacalcet (SENSIPAR) 30 MG tablet Take 3 tablets (90 mg total) by mouth Every Tuesday,Thursday,and Saturday with dialysis. 05/17/20 06/16/20  Alma Friendly, MD  clonazePAM (KLONOPIN) 0.5 MG tablet Take 0.5 mg by mouth daily.  02/11/18  [provider]  clopidogrel (PLAVIX) 75 MG tablet Take 1 tablet by mouth once daily 05/17/20   Almyra Deforest, PA  diphenhydramine-acetaminophen (TYLENOL PM EXTRA STRENGTH) 25-500 MG TABS tablet Take 2 tablets by mouth at bedtime as needed (Pain, Sleep).  05/12/18   [provider]  isosorbide mononitrate (IMDUR) 60 MG 24 hr tablet Take 1 tablet (60 mg total) by mouth daily. 05/16/20   Alma Friendly, MD  lanthanum (FOSRENOL)  1000 MG chewable tablet Chew 2 tablets (2,000 mg total) by mouth 3 (three) times daily with meals. 05/16/20 06/15/20  Alma Friendly, MD  metoprolol succinate (TOPROL-XL) 25 MG 24 hr tablet Take 1 tablet (25 mg total) by mouth 4 (four) times a week. Take on Sunday, Monday, Wednesday, Friday 05/17/20   Alma Friendly, MD  multivitamin (RENA-VIT) TABS tablet Take 1 tablet by mouth at bedtime. 05/16/20 06/15/20  Alma Friendly, MD  nitroGLYCERIN (NITROSTAT) 0.4 MG SL tablet Place 1 tablet (0.4 mg total) under the tongue every 5 (five) minutes x 3 doses as needed for chest pain. 05/16/20   Alma Friendly, MD    Family History    Family History  Problem Relation Age of Onset  . Emphysema Mother   . Cirrhosis Father    He indicated that his mother is deceased. He indicated that his father is deceased. He indicated that his maternal grandmother is deceased. He indicated that his maternal grandfather is deceased. He indicated that his paternal grandmother is deceased. He indicated that his paternal grandfather is deceased.  Social History    Social History   Socioeconomic History  . Marital status: Divorced    Spouse name: Not on file  . Number of children: Not on file  . Years of education: Not on file  . Highest education level: Not on file  Occupational History  . Not on file  Tobacco Use  . Smoking status: Former Smoker    Packs/day: 2.00    Years: 48.00    Pack years: 96.00    Types: Cigars    Quit date: 10/27/2017    Years since quitting: 2.6  . Smokeless tobacco: Former Systems developer    Types: Secondary school teacher  . Vaping Use: Never used  Substance and Sexual Activity  . Alcohol use: Not Currently  . Drug use: No  . Sexual activity: Not Currently  Other Topics Concern  . Not on file  Social History Narrative  . Not on file   Social Determinants of Health   Financial Resource Strain:   . Difficulty of Paying Living Expenses:   Food Insecurity:   . Worried About  Charity fundraiser in the Last Year:   . Arboriculturist in the Last Year:   Transportation Needs:   . Film/video editor (Medical):   Marland Kitchen Lack of Transportation (Non-Medical):   Physical Activity:   . Days of Exercise per Week:   . Minutes of Exercise per Session:   Stress:   . Feeling of Stress :   Social Connections:   . Frequency of Communication with Friends and Family:   . Frequency of Social Gatherings with Friends and Family:   . Attends Religious Services:   . Active Member of Clubs or Organizations:   . Attends Archivist Meetings:   Marland Kitchen Marital Status:   Intimate Partner Violence:   . Fear of Current or Ex-Partner:   . Emotionally Abused:   Marland Kitchen Physically Abused:   .  Sexually Abused:      Review of Systems    General:  No chills, fever, night sweats or weight changes.  Cardiovascular:  No chest pain, dyspnea on exertion, edema, orthopnea, palpitations, paroxysmal nocturnal dyspnea. Dermatological: No rash, lesions/masses Respiratory: No cough, dyspnea Urologic: No hematuria, dysuria Abdominal:   No nausea, vomiting, diarrhea, bright red blood per rectum, melena, or hematemesis Neurologic:  No visual changes, wkns, changes in mental status. All other systems reviewed and are otherwise negative except as noted above.  Physical Exam    VS:  There were no vitals taken for this visit. , BMI There is no height or weight on file to calculate BMI. GEN: Well nourished, well developed, in no acute distress. HEENT: normal. Neck: Supple, no JVD, carotid bruits, or masses. Cardiac: RRR, no murmurs, rubs, or gallops. No clubbing, cyanosis, edema.  Radials/DP/PT 2+ and equal bilaterally.  Respiratory:  Respirations regular and unlabored, clear to auscultation bilaterally. GI: Soft, nontender, nondistended, BS + x 4. MS: no deformity or atrophy. Skin: warm and dry, no rash. Neuro:  Strength and sensation are intact. Psych: Normal affect.  Accessory Clinical  Findings    Recent Labs: 05/16/2020: ALT 15; BUN 31; Creatinine, Ser 10.26; Hemoglobin 10.1; Magnesium 1.9; Platelets 192; Potassium 4.3; Sodium 137   Recent Lipid Panel    Component Value Date/Time   CHOL 170 05/12/2020 2015   CHOL 193 11/19/2018 0959   TRIG 84 05/12/2020 2015   HDL 40 (L) 05/12/2020 2015   HDL 50 11/19/2018 0959   CHOLHDL 4.3 05/12/2020 2015   VLDL 17 05/12/2020 2015   LDLCALC 113 (H) 05/12/2020 2015   LDLCALC 119 (H) 11/19/2018 0959    ECG personally reviewed by me today- *** - No acute changes  Assessment & Plan   1.  ***   Jossie Ng. Vora Clover NP-C    06/04/2020, 8:49 AM [place] [address] Office [phone] Fax [phone]  Notice: This dictation was prepared with Dragon dictation along with smaller phrase technology. Any transcriptional errors that result from this process are unintentional and may not be corrected upon review.

## 2020-06-05 ENCOUNTER — Ambulatory Visit: Payer: Medicare Other | Admitting: General Practice

## 2020-06-05 DIAGNOSIS — D631 Anemia in chronic kidney disease: Secondary | ICD-10-CM | POA: Diagnosis not present

## 2020-06-05 DIAGNOSIS — D509 Iron deficiency anemia, unspecified: Secondary | ICD-10-CM | POA: Diagnosis not present

## 2020-06-05 DIAGNOSIS — N2581 Secondary hyperparathyroidism of renal origin: Secondary | ICD-10-CM | POA: Diagnosis not present

## 2020-06-05 DIAGNOSIS — Z992 Dependence on renal dialysis: Secondary | ICD-10-CM | POA: Diagnosis not present

## 2020-06-05 DIAGNOSIS — N186 End stage renal disease: Secondary | ICD-10-CM | POA: Diagnosis not present

## 2020-06-07 DIAGNOSIS — N2581 Secondary hyperparathyroidism of renal origin: Secondary | ICD-10-CM | POA: Diagnosis not present

## 2020-06-07 DIAGNOSIS — D509 Iron deficiency anemia, unspecified: Secondary | ICD-10-CM | POA: Diagnosis not present

## 2020-06-07 DIAGNOSIS — N186 End stage renal disease: Secondary | ICD-10-CM | POA: Diagnosis not present

## 2020-06-07 DIAGNOSIS — D631 Anemia in chronic kidney disease: Secondary | ICD-10-CM | POA: Diagnosis not present

## 2020-06-07 DIAGNOSIS — Z992 Dependence on renal dialysis: Secondary | ICD-10-CM | POA: Diagnosis not present

## 2020-06-09 DIAGNOSIS — Z992 Dependence on renal dialysis: Secondary | ICD-10-CM | POA: Diagnosis not present

## 2020-06-09 DIAGNOSIS — N186 End stage renal disease: Secondary | ICD-10-CM | POA: Diagnosis not present

## 2020-06-09 DIAGNOSIS — N2581 Secondary hyperparathyroidism of renal origin: Secondary | ICD-10-CM | POA: Diagnosis not present

## 2020-06-09 DIAGNOSIS — D631 Anemia in chronic kidney disease: Secondary | ICD-10-CM | POA: Diagnosis not present

## 2020-06-09 DIAGNOSIS — D509 Iron deficiency anemia, unspecified: Secondary | ICD-10-CM | POA: Diagnosis not present

## 2020-06-12 DIAGNOSIS — D631 Anemia in chronic kidney disease: Secondary | ICD-10-CM | POA: Diagnosis not present

## 2020-06-12 DIAGNOSIS — N186 End stage renal disease: Secondary | ICD-10-CM | POA: Diagnosis not present

## 2020-06-12 DIAGNOSIS — D509 Iron deficiency anemia, unspecified: Secondary | ICD-10-CM | POA: Diagnosis not present

## 2020-06-12 DIAGNOSIS — N2581 Secondary hyperparathyroidism of renal origin: Secondary | ICD-10-CM | POA: Diagnosis not present

## 2020-06-12 DIAGNOSIS — Z992 Dependence on renal dialysis: Secondary | ICD-10-CM | POA: Diagnosis not present

## 2020-06-14 ENCOUNTER — Encounter (INDEPENDENT_AMBULATORY_CARE_PROVIDER_SITE_OTHER): Payer: Self-pay | Admitting: Family Medicine

## 2020-06-14 ENCOUNTER — Telehealth (INDEPENDENT_AMBULATORY_CARE_PROVIDER_SITE_OTHER): Payer: Self-pay | Admitting: Family Medicine

## 2020-06-14 DIAGNOSIS — D631 Anemia in chronic kidney disease: Secondary | ICD-10-CM | POA: Diagnosis not present

## 2020-06-14 DIAGNOSIS — Z992 Dependence on renal dialysis: Secondary | ICD-10-CM | POA: Diagnosis not present

## 2020-06-14 DIAGNOSIS — D509 Iron deficiency anemia, unspecified: Secondary | ICD-10-CM | POA: Diagnosis not present

## 2020-06-14 DIAGNOSIS — N186 End stage renal disease: Secondary | ICD-10-CM | POA: Diagnosis not present

## 2020-06-14 DIAGNOSIS — N2581 Secondary hyperparathyroidism of renal origin: Secondary | ICD-10-CM | POA: Diagnosis not present

## 2020-06-14 NOTE — Telephone Encounter (Signed)
Pt requested an appointment through MyChart for a Medicare Wellness Exam.

## 2020-06-16 DIAGNOSIS — N2581 Secondary hyperparathyroidism of renal origin: Secondary | ICD-10-CM | POA: Diagnosis not present

## 2020-06-16 DIAGNOSIS — D509 Iron deficiency anemia, unspecified: Secondary | ICD-10-CM | POA: Diagnosis not present

## 2020-06-16 DIAGNOSIS — Z992 Dependence on renal dialysis: Secondary | ICD-10-CM | POA: Diagnosis not present

## 2020-06-16 DIAGNOSIS — D631 Anemia in chronic kidney disease: Secondary | ICD-10-CM | POA: Diagnosis not present

## 2020-06-16 DIAGNOSIS — N186 End stage renal disease: Secondary | ICD-10-CM | POA: Diagnosis not present

## 2020-06-18 ENCOUNTER — Telehealth (INDEPENDENT_AMBULATORY_CARE_PROVIDER_SITE_OTHER): Payer: Self-pay | Admitting: Family Medicine

## 2020-06-18 NOTE — Telephone Encounter (Signed)
*  meant medicaid exam

## 2020-06-18 NOTE — Telephone Encounter (Signed)
Spoke to pt. Scheduled appt. For 08/08/2020 Code Z6109.

## 2020-06-18 NOTE — Telephone Encounter (Signed)
Duplicate message pt. Is scheduled for 10/13 Code G0438.

## 2020-06-18 NOTE — Telephone Encounter (Signed)
Pt left a Vm stating he would like to schedule an annual medicare exam.

## 2020-06-19 DIAGNOSIS — N2581 Secondary hyperparathyroidism of renal origin: Secondary | ICD-10-CM | POA: Diagnosis not present

## 2020-06-19 DIAGNOSIS — Z992 Dependence on renal dialysis: Secondary | ICD-10-CM | POA: Diagnosis not present

## 2020-06-19 DIAGNOSIS — N186 End stage renal disease: Secondary | ICD-10-CM | POA: Diagnosis not present

## 2020-06-19 DIAGNOSIS — D631 Anemia in chronic kidney disease: Secondary | ICD-10-CM | POA: Diagnosis not present

## 2020-06-19 DIAGNOSIS — D509 Iron deficiency anemia, unspecified: Secondary | ICD-10-CM | POA: Diagnosis not present

## 2020-06-21 DIAGNOSIS — N186 End stage renal disease: Secondary | ICD-10-CM | POA: Diagnosis not present

## 2020-06-21 DIAGNOSIS — N2581 Secondary hyperparathyroidism of renal origin: Secondary | ICD-10-CM | POA: Diagnosis not present

## 2020-06-21 DIAGNOSIS — D631 Anemia in chronic kidney disease: Secondary | ICD-10-CM | POA: Diagnosis not present

## 2020-06-21 DIAGNOSIS — D509 Iron deficiency anemia, unspecified: Secondary | ICD-10-CM | POA: Diagnosis not present

## 2020-06-21 DIAGNOSIS — Z992 Dependence on renal dialysis: Secondary | ICD-10-CM | POA: Diagnosis not present

## 2020-06-23 DIAGNOSIS — D631 Anemia in chronic kidney disease: Secondary | ICD-10-CM | POA: Diagnosis not present

## 2020-06-23 DIAGNOSIS — D509 Iron deficiency anemia, unspecified: Secondary | ICD-10-CM | POA: Diagnosis not present

## 2020-06-23 DIAGNOSIS — Z992 Dependence on renal dialysis: Secondary | ICD-10-CM | POA: Diagnosis not present

## 2020-06-23 DIAGNOSIS — N186 End stage renal disease: Secondary | ICD-10-CM | POA: Diagnosis not present

## 2020-06-23 DIAGNOSIS — N2581 Secondary hyperparathyroidism of renal origin: Secondary | ICD-10-CM | POA: Diagnosis not present

## 2020-06-24 NOTE — Progress Notes (Deleted)
Cardiology Clinic Note   Patient Name: Kenneth [name] Date of Encounter: 06/24/2020  Primary Care Provider:  [name], MD Primary Cardiologist:  Kenneth [name], MD  Patient Profile    Kenneth [name] 64 year old male presents the clinic today for follow-up evaluation status post NSTEMI. (Ongoing medical management recommended)  Past Medical History    Past Medical History:  Diagnosis Date  . Acute CHF ([place]) 01/2018  . Cardiomyopathy secondary    likely related to HTN heart disease; possibly ETOH related as well  . Chronic combined systolic and diastolic heart failure (HCC)    Echocardiogram 09/22/11: Moderate LVH, EF 20-25%, grade 3 diastolic dysfunction, mild MR, moderate to severe LAE, mild RVE, mild to moderate TR, small to moderate pericardial effusion  . Coronary artery disease    not felt to be candidate for CABG 08/2018; medical thearpy recomended due to high risk of PCI   . Dysrhythmia    aflutter 04/2016, afib 02/2018, not felt to be a candidate for anticoagulation due to non-compliance and ETOH  . ESRD (end stage renal disease) on dialysis ([place])    due to hypertensive nephrosclerosis; TTS; [facility] (09/01/2018)  . History of alcohol abuse   . Hypertension   . Myocardial infarction [facility])    " mild " per daughter  . PEA (Pulseless electrical activity) ([place])    PEA arrest 03/09/18, treated empirically for hyperkalemia, shock x1 for WCT, given amiodarone, ROSC after 10 min of ACLS   Past Surgical History:  Procedure Laterality Date  . AV FISTULA PLACEMENT Left 05/14/2016   Procedure: LEFT ARM BASILIC VEIN TRANSPOSITION;  Surgeon: Kenneth Posner, MD;  Location: [place];  Service: Vascular;  Laterality: Left;  . INSERTION OF DIALYSIS CATHETER N/A 06/20/2019   Procedure: INSERTION OF TUNNELED  DIALYSIS CATHETER;  Surgeon: Kenneth Dutch, MD;  Location: [place];  Service: Vascular;  Laterality: N/A;  . LEFT HEART CATH AND CORONARY ANGIOGRAPHY N/A 09/02/2018   Procedure:  LEFT HEART CATH AND CORONARY ANGIOGRAPHY;  Surgeon: Kenneth Bush, MD;  Location: [place] CV [facility];  Service: Cardiovascular;  Laterality: N/A;  . LEFT HEART CATH AND CORONARY ANGIOGRAPHY N/A 05/14/2020   Procedure: LEFT HEART CATH AND CORONARY ANGIOGRAPHY;  Surgeon: Kenneth Mocha, MD;  Location: [place] CV LAB;  Service: Cardiovascular;  Laterality: N/A;  . PERIPHERAL VASCULAR CATHETERIZATION N/A 05/13/2016   Procedure: Dialysis/Perma Catheter Insertion;  Surgeon: Kenneth Mitchell, MD;  Location: [place] CV LAB;  Service: Cardiovascular;  Laterality: N/A;  . REVISION OF ARTERIOVENOUS GORETEX GRAFT Left 02/20/622   Procedure: PLICATION OF ARTERIOVENOUS FISTULA LEFT ARM;  Surgeon: Kenneth Dutch, MD;  Location: [place];  Service: Vascular;  Laterality: Left;    Allergies  No Known Allergies  History of Present Illness    Mr. Kenneth [name] has a PMH of three-vessel coronary artery disease who was being medically managed. He underwent cardiac catheterization was not felt to be a good candidate for CABG or high risk PCI. PMH also includes chronic diastolic CHF, atrial flutter/fibrillation he was not a candidate for anticoagulation, end-stage renal disease on HD, hypertension, hyperlipidemia, and EtOH abuse. He was admitted with non-STEMI in the setting of postdialysis hypotension. His troponins were [id number]--[id number],[id number]. He underwent cardiac catheterization on 05/14/2020. It showed severe multivessel CAD with severe calcified distal left main, severe diffuse ostial, proximal LAD, severe stenosis of proximal left circumflex and interval occlusion of the distal RCA with left-to-right collaterals. Continued medical therapy was recommended. Cath site  right groin. Echocardiogram showed LVEF 55-60% moderate LVH, G1 DD mildly dilated left atria, and moderate mitral valve regurgitation. On HD.  Metoprolol 25 mg added for nondialysis days during his admission. Received HD during admission and was chest  pain-free. Femoral ultrasound showed no evidence of pseudoaneurysm, AVR, or DVT. He was discharged in stable condition.  He presents to the clinic today for follow-up evaluation and states***  *** denies chest pain, shortness of breath, lower extremity edema, fatigue, palpitations, melena, hematuria, hemoptysis, diaphoresis, weakness, presyncope, syncope, orthopnea, and PND.   Home Medications    Prior to Admission medications   Medication Sig Start Date End Date Taking? Authorizing Provider  aspirin EC 81 MG EC tablet Take 1 tablet (81 mg total) by mouth daily. Swallow whole. 05/16/20   Kenneth Friendly, MD  atorvastatin (LIPITOR) 80 MG tablet Take 1 tablet (80 mg total) by mouth daily. 03/16/20   [name], Kenneth M, MD  clonazePAM (KLONOPIN) 0.5 MG tablet Take 0.5 mg by mouth daily.  02/11/18   [provider]  clopidogrel (PLAVIX) 75 MG tablet Take 1 tablet by mouth once daily 05/17/20   Kenneth Deforest, PA  diphenhydramine-acetaminophen (TYLENOL PM EXTRA STRENGTH) 25-500 MG TABS tablet Take 2 tablets by mouth at bedtime as needed (Pain, Sleep).  05/12/18   [provider]  isosorbide mononitrate (IMDUR) 60 MG 24 hr tablet Take 1 tablet (60 mg total) by mouth daily. 05/16/20   Kenneth Friendly, MD  metoprolol succinate (TOPROL-XL) 25 MG 24 hr tablet Take 1 tablet (25 mg total) by mouth 4 (four) times a week. Take on Sunday, Monday, Wednesday, Friday 05/17/20   Kenneth Friendly, MD  nitroGLYCERIN (NITROSTAT) 0.4 MG SL tablet Place 1 tablet (0.4 mg total) under the tongue every 5 (five) minutes x 3 doses as needed for chest pain. 05/16/20   Kenneth Friendly, MD    Family History    Family History  Problem Relation Age of Onset  . Emphysema Mother   . Cirrhosis Father    He indicated that his mother is deceased. He indicated that his father is deceased. He indicated that his maternal grandmother is deceased. He indicated that his maternal grandfather is deceased. He  indicated that his paternal grandmother is deceased. He indicated that his paternal grandfather is deceased.  Social History    Social History   Socioeconomic History  . Marital status: Divorced    Spouse name: Not on file  . Number of children: Not on file  . Years of education: Not on file  . Highest education level: Not on file  Occupational History  . Not on file  Tobacco Use  . Smoking status: Former Smoker    Packs/day: 2.00    Years: 48.00    Pack years: 96.00    Types: Cigars    Quit date: 10/27/2017    Years since quitting: 2.6  . Smokeless tobacco: Former Systems developer    Types: Secondary school teacher  . Vaping Use: Never used  Substance and Sexual Activity  . Alcohol use: Not Currently  . Drug use: No  . Sexual activity: Not Currently  Other Topics Concern  . Not on file  Social History Narrative  . Not on file   Social Determinants of Health   Financial Resource Strain:   . Difficulty of Paying Living Expenses: Not on file  Food Insecurity:   . Worried About Charity fundraiser in the Last Year: Not on file  .  Ran Out of Food in the Last Year: Not on file  Transportation Needs:   . Lack of Transportation (Medical): Not on file  . Lack of Transportation (Non-Medical): Not on file  Physical Activity:   . Days of Exercise per Week: Not on file  . Minutes of Exercise per Session: Not on file  Stress:   . Feeling of Stress : Not on file  Social Connections:   . Frequency of Communication with Friends and Family: Not on file  . Frequency of Social Gatherings with Friends and Family: Not on file  . Attends Religious Services: Not on file  . Active Member of Clubs or Organizations: Not on file  . Attends Archivist Meetings: Not on file  . Marital Status: Not on file  Intimate Partner Violence:   . Fear of Current or Ex-Partner: Not on file  . Emotionally Abused: Not on file  . Physically Abused: Not on file  . Sexually Abused: Not on file     Review of  Systems    General:  No chills, fever, night sweats or weight changes.  Cardiovascular:  No chest pain, dyspnea on exertion, edema, orthopnea, palpitations, paroxysmal nocturnal dyspnea. Dermatological: No rash, lesions/masses Respiratory: No cough, dyspnea Urologic: No hematuria, dysuria Abdominal:   No nausea, vomiting, diarrhea, bright red blood per rectum, melena, or hematemesis Neurologic:  No visual changes, wkns, changes in mental status. All other systems reviewed and are otherwise negative except as noted above.  Physical Exam    VS:  There were no vitals taken for this visit. , BMI There is no height or weight on file to calculate BMI. GEN: Well nourished, well developed, in no acute distress. HEENT: normal. Neck: Supple, no JVD, carotid bruits, or masses. Cardiac: RRR, no murmurs, rubs, or gallops. No clubbing, cyanosis, edema.  Radials/DP/PT 2+ and equal bilaterally.  Respiratory:  Respirations regular and unlabored, clear to auscultation bilaterally. GI: Soft, nontender, nondistended, BS + x 4. MS: no deformity or atrophy. Skin: warm and dry, no rash. Neuro:  Strength and sensation are intact. Psych: Normal affect.  Accessory Clinical Findings    Recent Labs: 05/16/2020: ALT 15; BUN 31; Creatinine, Ser 10.26; Hemoglobin 10.1; Magnesium 1.9; Platelets 192; Potassium 4.3; Sodium 137   Recent Lipid Panel    Component Value Date/Time   CHOL 170 05/12/2020 2015   CHOL 193 11/19/2018 0959   TRIG 84 05/12/2020 2015   HDL 40 (L) 05/12/2020 2015   HDL 50 11/19/2018 0959   CHOLHDL 4.3 05/12/2020 2015   VLDL 17 05/12/2020 2015   LDLCALC 113 (H) 05/12/2020 2015   LDLCALC 119 (H) 11/19/2018 0959    ECG personally reviewed by me today- *** - No acute changes  Echocardiogram 05/13/2020 Left ventricular ejection fraction, by estimation, is 55 to 60%. The  left ventricle has normal function. The left ventricle has no regional  wall motion abnormalities. There is moderate  left ventricular hypertrophy.  Left ventricular diastolic  parameters are consistent with Grade I diastolic dysfunction (impaired  relaxation).  2. Right ventricular systolic function is normal. The right ventricular  size is normal. There is normal pulmonary artery systolic pressure.  3. Left atrial size was mildly dilated.  4. The mitral valve is abnormal. Moderate mitral valve regurgitation.  5. The aortic valve is tricuspid. Aortic valve regurgitation is not  visualized. Mild aortic valve sclerosis is present, with no evidence of  aortic valve stenosis.   Cardiac catheterization 05/14/2020 Severe multivessel coronary artery  disease with severe calcific distal left main stenosis, severe diffuse ostial/proximal LAD stenosis, severe stenosis of the proximal left circumflex/first OM, and interval occlusion of the distal RCA with left-to-right collaterals 2.  Heavily calcified distal abdominal aorta and bilateral iliac arteries with mild diffuse calcified disease.  No severe iliac lesions identified.  Recommend ongoing medical therapy.  With total occlusion of the RCA and severe calcification of the distal left main and complex disease in the LAD and left circumflex, I think risk of protected PCI would be extremely high.  I also think his access is marginal for large bore support devices from the groin.  Diagnostic Dominance: Right  Intervention    Assessment & Plan   1. NSTEMI-no chest pain today. Had episode of chest discomfort and hypotension post dialysis. Troponins were elevated and EKG showed ST elevation in inferior leads. However, these resolved with improvement in his blood pressure. His chest pain also improved with BP improvements. Echocardiogram showed normal LV function, G1 DD. Underwent cardiac catheterization on 05/14/2020. It showed severe multivessel CAD with severe calcified distal left main, severe diffuse ostial, proximal LAD, severe stenosis of proximal left  circumflex and interval occlusion of the distal RCA with left-to-right collaterals. Continued medical therapy was recommended. Continue Imdur, metoprolol, aspirin, atorvastatin, nitroglycerin Heart healthy low-sodium diet-salty 6 given Increase physical activity as tolerated  Essential hypertension-BP today***. Metoprolol added during hospital admission for nondialysis days. Well-controlled at home. Continue Imdur, metoprolol Heart healthy low-sodium diet-salty 6 given Increase physical activity as tolerated  Hyperlipidemia-LDL 113 with his recent admission. Continue atorvastatin Heart healthy low-sodium high-fiber diet Increase physical activity as tolerated  Right groin hematoma-Femoral ultrasound showed no evidence of pseudoaneurysm, AVR, or DVT. Groin site well-healed. No erythema, drainage.  Disposition: Follow-up with Dr. [name] in 3 months.   Jossie Ng. [place] Hedman NP-C    06/24/2020, 2:11 PM [facility] [place] [address] Office [phone] Fax [phone]  Notice: This dictation was prepared with Dragon dictation along with smaller phrase technology. Any transcriptional errors that result from this process are unintentional and may not be corrected upon review.

## 2020-06-25 ENCOUNTER — Ambulatory Visit: Payer: Medicare Other | Admitting: General Practice

## 2020-06-26 ENCOUNTER — Other Ambulatory Visit: Payer: Self-pay

## 2020-06-26 ENCOUNTER — Telehealth: Payer: Self-pay | Admitting: Cardiology

## 2020-06-26 ENCOUNTER — Telehealth: Payer: Self-pay | Admitting: Family Medicine

## 2020-06-26 DIAGNOSIS — D509 Iron deficiency anemia, unspecified: Secondary | ICD-10-CM | POA: Diagnosis not present

## 2020-06-26 DIAGNOSIS — Z992 Dependence on renal dialysis: Secondary | ICD-10-CM | POA: Diagnosis not present

## 2020-06-26 DIAGNOSIS — D631 Anemia in chronic kidney disease: Secondary | ICD-10-CM | POA: Diagnosis not present

## 2020-06-26 DIAGNOSIS — N186 End stage renal disease: Secondary | ICD-10-CM | POA: Diagnosis not present

## 2020-06-26 DIAGNOSIS — N2581 Secondary hyperparathyroidism of renal origin: Secondary | ICD-10-CM | POA: Diagnosis not present

## 2020-06-26 MED ORDER — ISOSORBIDE MONONITRATE ER 60 MG PO TB24: 60.0000 mg | ORAL_TABLET | Freq: Every day | ORAL | 8 refills | Status: DC

## 2020-06-26 MED ORDER — METOPROLOL SUCCINATE ER 25 MG PO TB24: 25.0000 mg | ORAL_TABLET | ORAL | 8 refills | Status: DC

## 2020-06-26 NOTE — Telephone Encounter (Signed)
Spoke with pt Daughter listed on pt DPR advised that pt has 11 refills at the pharmacy, advised to call pharmacy for refills

## 2020-06-26 NOTE — Telephone Encounter (Signed)
Kenneth [name] (on [place]) stated the pt needs refills and she contacted the pharmacy but was told to call the PCP  Medication: Aspirin  Renavite Kenneth [name] stated he was prescribed this one in the hospital)  Pharmacy: [place], [place] Phone:  [phone]  Fax:  [phone]

## 2020-06-26 NOTE — Telephone Encounter (Signed)
*  STAT* If patient is at the pharmacy, call can be transferred to refill team.   1. Which medications need to be refilled? (please list name of each medication and dose if known) isosorbide mononitrate (IMDUR) 60 MG 24 hr tablet and   metoprolol succinate (TOPROL-XL) 25 MG 24 hr tablet     2. Which pharmacy/location (including street and city if local pharmacy) is medication to be sent to? [place], [place]  3. Do they need a 30 day or 90 day supply? 30 day

## 2020-06-27 DIAGNOSIS — I129 Hypertensive chronic kidney disease with stage 1 through stage 4 chronic kidney disease, or unspecified chronic kidney disease: Secondary | ICD-10-CM | POA: Diagnosis not present

## 2020-06-27 DIAGNOSIS — N186 End stage renal disease: Secondary | ICD-10-CM | POA: Diagnosis not present

## 2020-06-27 DIAGNOSIS — Z992 Dependence on renal dialysis: Secondary | ICD-10-CM | POA: Diagnosis not present

## 2020-06-28 ENCOUNTER — Other Ambulatory Visit: Payer: Self-pay | Admitting: Cardiology

## 2020-06-28 DIAGNOSIS — N2581 Secondary hyperparathyroidism of renal origin: Secondary | ICD-10-CM | POA: Diagnosis not present

## 2020-06-28 DIAGNOSIS — N186 End stage renal disease: Secondary | ICD-10-CM | POA: Diagnosis not present

## 2020-06-28 DIAGNOSIS — Z992 Dependence on renal dialysis: Secondary | ICD-10-CM | POA: Diagnosis not present

## 2020-06-28 DIAGNOSIS — D631 Anemia in chronic kidney disease: Secondary | ICD-10-CM | POA: Diagnosis not present

## 2020-06-28 MED ORDER — ISOSORBIDE MONONITRATE ER 60 MG PO TB24: 60.0000 mg | ORAL_TABLET | Freq: Every day | ORAL | 8 refills | Status: DC

## 2020-06-28 NOTE — Telephone Encounter (Signed)
Called and spoke with daughter. She states that she called [facility] about an hour ago and they do not have his RX for imdur. Made patient aware that it was sent in today.   [facility] [address]. She states that the patient's Rx is there and ready for pick up. Called and made patient's daughter aware.

## 2020-06-28 NOTE — Telephone Encounter (Signed)
Spoke with caller. Explained that Rena-vite is not on med list, but also not a cardiac med. Patient needs imdur refilled - Rx sent in

## 2020-06-28 NOTE — Telephone Encounter (Signed)
Pt c/o medication issue:  1. Name of Medication: isosorbide mononitrate (IMDUR) 60 MG 24 hr tablet  2. How are you currently taking this medication (dosage and times per day)? As directed  3. Are you having a reaction (difficulty breathing--STAT)? No   4. What is your medication issue? Per patient, the pharmacy [facility] [place], [place]) states they have not received Rx for this medication. She is requesting to have it resubmitted. Please assist.

## 2020-06-28 NOTE — Telephone Encounter (Signed)
°*  STAT* If patient is at the pharmacy, call can be transferred to refill team.   1. Which medications need to be refilled? (please list name of each medication and dose if known)  multivitamin (RENA-VIT) TABS tablet [[id number]]   2. Which pharmacy/location (including street and city if local pharmacy) is medication to be sent to? [place], [place]  3. Do they need a 30 day or 90 day supply? 30 day supply

## 2020-06-30 DIAGNOSIS — N186 End stage renal disease: Secondary | ICD-10-CM | POA: Diagnosis not present

## 2020-06-30 DIAGNOSIS — D631 Anemia in chronic kidney disease: Secondary | ICD-10-CM | POA: Diagnosis not present

## 2020-06-30 DIAGNOSIS — Z992 Dependence on renal dialysis: Secondary | ICD-10-CM | POA: Diagnosis not present

## 2020-06-30 DIAGNOSIS — N2581 Secondary hyperparathyroidism of renal origin: Secondary | ICD-10-CM | POA: Diagnosis not present

## 2020-07-03 DIAGNOSIS — D631 Anemia in chronic kidney disease: Secondary | ICD-10-CM | POA: Diagnosis not present

## 2020-07-03 DIAGNOSIS — N2581 Secondary hyperparathyroidism of renal origin: Secondary | ICD-10-CM | POA: Diagnosis not present

## 2020-07-03 DIAGNOSIS — N186 End stage renal disease: Secondary | ICD-10-CM | POA: Diagnosis not present

## 2020-07-03 DIAGNOSIS — Z992 Dependence on renal dialysis: Secondary | ICD-10-CM | POA: Diagnosis not present

## 2020-07-05 DIAGNOSIS — N186 End stage renal disease: Secondary | ICD-10-CM | POA: Diagnosis not present

## 2020-07-05 DIAGNOSIS — N2581 Secondary hyperparathyroidism of renal origin: Secondary | ICD-10-CM | POA: Diagnosis not present

## 2020-07-05 DIAGNOSIS — Z992 Dependence on renal dialysis: Secondary | ICD-10-CM | POA: Diagnosis not present

## 2020-07-05 DIAGNOSIS — D631 Anemia in chronic kidney disease: Secondary | ICD-10-CM | POA: Diagnosis not present

## 2020-07-07 DIAGNOSIS — N2581 Secondary hyperparathyroidism of renal origin: Secondary | ICD-10-CM | POA: Diagnosis not present

## 2020-07-07 DIAGNOSIS — Z992 Dependence on renal dialysis: Secondary | ICD-10-CM | POA: Diagnosis not present

## 2020-07-07 DIAGNOSIS — D631 Anemia in chronic kidney disease: Secondary | ICD-10-CM | POA: Diagnosis not present

## 2020-07-07 DIAGNOSIS — N186 End stage renal disease: Secondary | ICD-10-CM | POA: Diagnosis not present

## 2020-07-10 DIAGNOSIS — N186 End stage renal disease: Secondary | ICD-10-CM | POA: Diagnosis not present

## 2020-07-10 DIAGNOSIS — N2581 Secondary hyperparathyroidism of renal origin: Secondary | ICD-10-CM | POA: Diagnosis not present

## 2020-07-10 DIAGNOSIS — D631 Anemia in chronic kidney disease: Secondary | ICD-10-CM | POA: Diagnosis not present

## 2020-07-10 DIAGNOSIS — Z992 Dependence on renal dialysis: Secondary | ICD-10-CM | POA: Diagnosis not present

## 2020-07-12 DIAGNOSIS — D631 Anemia in chronic kidney disease: Secondary | ICD-10-CM | POA: Diagnosis not present

## 2020-07-12 DIAGNOSIS — N2581 Secondary hyperparathyroidism of renal origin: Secondary | ICD-10-CM | POA: Diagnosis not present

## 2020-07-12 DIAGNOSIS — Z992 Dependence on renal dialysis: Secondary | ICD-10-CM | POA: Diagnosis not present

## 2020-07-12 DIAGNOSIS — N186 End stage renal disease: Secondary | ICD-10-CM | POA: Diagnosis not present

## 2020-07-13 ENCOUNTER — Other Ambulatory Visit: Admit: 2020-07-13 | Discharge: 2020-07-13 | Disposition: A | Discharge status: Home / Self Care | Payer: Medicare PPO

## 2020-07-13 ENCOUNTER — Other Ambulatory Visit (HOSPITAL_BASED_OUTPATIENT_CLINIC_OR_DEPARTMENT_OTHER): Payer: Self-pay | Admitting: Nephrology

## 2020-07-13 DIAGNOSIS — N051 Unspecified nephritic syndrome with focal and segmental glomerular lesions: Secondary | ICD-10-CM

## 2020-07-13 DIAGNOSIS — N170 Acute kidney failure with tubular necrosis: Secondary | ICD-10-CM

## 2020-07-17 DIAGNOSIS — D631 Anemia in chronic kidney disease: Secondary | ICD-10-CM | POA: Diagnosis not present

## 2020-07-17 DIAGNOSIS — N2581 Secondary hyperparathyroidism of renal origin: Secondary | ICD-10-CM | POA: Diagnosis not present

## 2020-07-17 DIAGNOSIS — Z992 Dependence on renal dialysis: Secondary | ICD-10-CM | POA: Diagnosis not present

## 2020-07-17 DIAGNOSIS — N186 End stage renal disease: Secondary | ICD-10-CM | POA: Diagnosis not present

## 2020-07-19 ENCOUNTER — Other Ambulatory Visit: Payer: Self-pay | Admitting: Cardiology

## 2020-07-19 DIAGNOSIS — Z992 Dependence on renal dialysis: Secondary | ICD-10-CM | POA: Diagnosis not present

## 2020-07-19 DIAGNOSIS — D631 Anemia in chronic kidney disease: Secondary | ICD-10-CM | POA: Diagnosis not present

## 2020-07-19 DIAGNOSIS — N2581 Secondary hyperparathyroidism of renal origin: Secondary | ICD-10-CM | POA: Diagnosis not present

## 2020-07-19 DIAGNOSIS — N186 End stage renal disease: Secondary | ICD-10-CM | POA: Diagnosis not present

## 2020-07-19 NOTE — Telephone Encounter (Signed)
*  STAT* If patient is at the pharmacy, call can be transferred to refill team.   1. Which medications need to be refilled? (please list name of each medication and dose if known) nitroGLYCERIN (NITROSTAT) 0.4 MG SL tablet  2. Which pharmacy/location (including street and city if local pharmacy) is medication to be sent to? [place], [place]  3. Do they need a 30 day or 90 day supply? 30 day

## 2020-07-20 LAB — EXTERNAL PATHOLOGY REVIEW

## 2020-07-20 MED ORDER — NITROGLYCERIN 0.4 MG SL SUBL: 0.4000 mg | SUBLINGUAL_TABLET | SUBLINGUAL | 0 refills | Status: DC | PRN

## 2020-07-21 DIAGNOSIS — N186 End stage renal disease: Secondary | ICD-10-CM | POA: Diagnosis not present

## 2020-07-21 DIAGNOSIS — Z992 Dependence on renal dialysis: Secondary | ICD-10-CM | POA: Diagnosis not present

## 2020-07-21 DIAGNOSIS — N2581 Secondary hyperparathyroidism of renal origin: Secondary | ICD-10-CM | POA: Diagnosis not present

## 2020-07-21 DIAGNOSIS — D631 Anemia in chronic kidney disease: Secondary | ICD-10-CM | POA: Diagnosis not present

## 2020-07-24 DIAGNOSIS — D631 Anemia in chronic kidney disease: Secondary | ICD-10-CM | POA: Diagnosis not present

## 2020-07-24 DIAGNOSIS — N2581 Secondary hyperparathyroidism of renal origin: Secondary | ICD-10-CM | POA: Diagnosis not present

## 2020-07-24 DIAGNOSIS — Z992 Dependence on renal dialysis: Secondary | ICD-10-CM | POA: Diagnosis not present

## 2020-07-24 DIAGNOSIS — N186 End stage renal disease: Secondary | ICD-10-CM | POA: Diagnosis not present

## 2020-07-26 DIAGNOSIS — D631 Anemia in chronic kidney disease: Secondary | ICD-10-CM | POA: Diagnosis not present

## 2020-07-26 DIAGNOSIS — Z992 Dependence on renal dialysis: Secondary | ICD-10-CM | POA: Diagnosis not present

## 2020-07-26 DIAGNOSIS — N2581 Secondary hyperparathyroidism of renal origin: Secondary | ICD-10-CM | POA: Diagnosis not present

## 2020-07-26 DIAGNOSIS — N186 End stage renal disease: Secondary | ICD-10-CM | POA: Diagnosis not present

## 2020-07-27 DIAGNOSIS — Z992 Dependence on renal dialysis: Secondary | ICD-10-CM | POA: Diagnosis not present

## 2020-07-27 DIAGNOSIS — I129 Hypertensive chronic kidney disease with stage 1 through stage 4 chronic kidney disease, or unspecified chronic kidney disease: Secondary | ICD-10-CM | POA: Diagnosis not present

## 2020-07-27 DIAGNOSIS — N186 End stage renal disease: Secondary | ICD-10-CM | POA: Diagnosis not present

## 2020-07-28 DIAGNOSIS — N2581 Secondary hyperparathyroidism of renal origin: Secondary | ICD-10-CM | POA: Diagnosis not present

## 2020-07-28 DIAGNOSIS — Z992 Dependence on renal dialysis: Secondary | ICD-10-CM | POA: Diagnosis not present

## 2020-07-28 DIAGNOSIS — D631 Anemia in chronic kidney disease: Secondary | ICD-10-CM | POA: Diagnosis not present

## 2020-07-28 DIAGNOSIS — Z23 Encounter for immunization: Secondary | ICD-10-CM | POA: Diagnosis not present

## 2020-07-28 DIAGNOSIS — N186 End stage renal disease: Secondary | ICD-10-CM | POA: Diagnosis not present

## 2020-07-31 DIAGNOSIS — N186 End stage renal disease: Secondary | ICD-10-CM | POA: Diagnosis not present

## 2020-07-31 DIAGNOSIS — Z23 Encounter for immunization: Secondary | ICD-10-CM | POA: Diagnosis not present

## 2020-07-31 DIAGNOSIS — N2581 Secondary hyperparathyroidism of renal origin: Secondary | ICD-10-CM | POA: Diagnosis not present

## 2020-07-31 DIAGNOSIS — Z992 Dependence on renal dialysis: Secondary | ICD-10-CM | POA: Diagnosis not present

## 2020-07-31 DIAGNOSIS — D631 Anemia in chronic kidney disease: Secondary | ICD-10-CM | POA: Diagnosis not present

## 2020-08-02 DIAGNOSIS — N186 End stage renal disease: Secondary | ICD-10-CM | POA: Diagnosis not present

## 2020-08-02 DIAGNOSIS — Z992 Dependence on renal dialysis: Secondary | ICD-10-CM | POA: Diagnosis not present

## 2020-08-02 DIAGNOSIS — N2581 Secondary hyperparathyroidism of renal origin: Secondary | ICD-10-CM | POA: Diagnosis not present

## 2020-08-02 DIAGNOSIS — D631 Anemia in chronic kidney disease: Secondary | ICD-10-CM | POA: Diagnosis not present

## 2020-08-02 DIAGNOSIS — Z23 Encounter for immunization: Secondary | ICD-10-CM | POA: Diagnosis not present

## 2020-08-04 DIAGNOSIS — N2581 Secondary hyperparathyroidism of renal origin: Secondary | ICD-10-CM | POA: Diagnosis not present

## 2020-08-04 DIAGNOSIS — D631 Anemia in chronic kidney disease: Secondary | ICD-10-CM | POA: Diagnosis not present

## 2020-08-04 DIAGNOSIS — Z992 Dependence on renal dialysis: Secondary | ICD-10-CM | POA: Diagnosis not present

## 2020-08-04 DIAGNOSIS — Z23 Encounter for immunization: Secondary | ICD-10-CM | POA: Diagnosis not present

## 2020-08-04 DIAGNOSIS — N186 End stage renal disease: Secondary | ICD-10-CM | POA: Diagnosis not present

## 2020-08-07 DIAGNOSIS — D631 Anemia in chronic kidney disease: Secondary | ICD-10-CM | POA: Diagnosis not present

## 2020-08-07 DIAGNOSIS — Z992 Dependence on renal dialysis: Secondary | ICD-10-CM | POA: Diagnosis not present

## 2020-08-07 DIAGNOSIS — Z23 Encounter for immunization: Secondary | ICD-10-CM | POA: Diagnosis not present

## 2020-08-07 DIAGNOSIS — N2581 Secondary hyperparathyroidism of renal origin: Secondary | ICD-10-CM | POA: Diagnosis not present

## 2020-08-07 DIAGNOSIS — N186 End stage renal disease: Secondary | ICD-10-CM | POA: Diagnosis not present

## 2020-08-08 ENCOUNTER — Ambulatory Visit (INDEPENDENT_AMBULATORY_CARE_PROVIDER_SITE_OTHER): Payer: BLUE CROSS/BLUE SHIELD | Admitting: Family Medicine

## 2020-08-08 ENCOUNTER — Encounter (INDEPENDENT_AMBULATORY_CARE_PROVIDER_SITE_OTHER): Payer: Self-pay | Admitting: Family Medicine

## 2020-08-08 VITALS — BP 122/80 | HR 66 | Temp 98.4°F | Ht 69.5 in | Wt 208.0 lb

## 2020-08-08 DIAGNOSIS — Z1159 Encounter for screening for other viral diseases: Secondary | ICD-10-CM

## 2020-08-08 DIAGNOSIS — E7800 Pure hypercholesterolemia, unspecified: Secondary | ICD-10-CM

## 2020-08-08 DIAGNOSIS — E781 Pure hyperglyceridemia: Secondary | ICD-10-CM

## 2020-08-08 DIAGNOSIS — E559 Vitamin D deficiency, unspecified: Secondary | ICD-10-CM

## 2020-08-08 DIAGNOSIS — Z0000 Encounter for general adult medical examination without abnormal findings: Secondary | ICD-10-CM

## 2020-08-08 DIAGNOSIS — Z23 Encounter for immunization: Secondary | ICD-10-CM

## 2020-08-08 DIAGNOSIS — Z125 Encounter for screening for malignant neoplasm of prostate: Secondary | ICD-10-CM

## 2020-08-08 DIAGNOSIS — R748 Abnormal levels of other serum enzymes: Secondary | ICD-10-CM

## 2020-08-08 DIAGNOSIS — N400 Enlarged prostate without lower urinary tract symptoms: Secondary | ICD-10-CM

## 2020-08-08 NOTE — Progress Notes (Signed)
[facility] [place] [facility]                       Date of Exam: 08/08/2020 10:50 AM        Patient ID: Keith Hebert is a 64 y.o. male.  Attending Physician: Sherie Don, MD        Reason for visit:    Patient is here today for a Medicare Wellness visit.     Medicare AWV Note:    LEGAL DOCUMENTS and CODE STATUS:   Advance Directive Received: Yes   Type of Document Received:  He will get [place] a copy       Code Status: Will update once directives are received.     Concerns Outside of the Medicare Wellness     HPI:      Issues outside of the Medicare Wellness will be addressed in a separate note.         Health Risk Assessment:     During the past month, how would you rate your general health?:  Good  Which of the following tasks can you do without assistance  drive or take the bus alone; shop for groceries or clothes; prepare your own meals; do your own housework/laundry; handle your own finances/pay bills; eat, bathe or get around your home?:  Drive or take the bus alone, Do your own housework/laundry, Shop for groceries or clothes, Handle your own finances/pay bills, Prepare your own meals, Eat, bathe, dress or get around your home  Which of the following problems have you been bothered by in the past month  dizzy when standing up; problems using the phone; feeling tired or fatigued; moderate or severe body pain?: Moderate or severe body pain  Do you exercise for about 20 minutes 3 or more days per week?:  Yes  During the past month was someone available to help if you needed and wanted help?  For example, if you felt nervous, lonely, got sick and had to stay in bed, needed someone to talk to, needed help with daily chores or needed help just taking care of yourself.: Yes  Do you always wear a seat belt?: Yes  Do you have any trouble taking medications the way you have been told to take them?: No  Have you been given any information that can help you with keeping track of your  medications?: No (N/A--not taking meds)  Do you have trouble paying for your medications?: No (N/A)  Have you been given any information that can help you with hazards in your house, such as scatter rugs, furniture, etc?: No  Do you feel unsteady when standing or walking?: No  Do you worry about falling?: No  Have you fallen two or more times in the past year?: No  Did you suffer any injuries from your falls in the past year?: No      Hospitalizations:   no hospitalizations within past year    Depression Screening:   Performed and documented in screening tab.        Functional Assessment:   Falls Risk:  home does not have throw rugs, poor lighting or a slippery bath tub or shower  Hearing:  hearing within normal limits  Exercise:  Moderate ( i.e. brisk walking )  ADL's:   Bathing - independent   Dressing - independent   Mobility - independent   Transfer - independent   Eating - independent}   Toileting -  independent   ADL assistance not needed      Cognitive Function:     Mood/affect: Appropriate  Appearance: neatly groomed, appropriately and adequately nourished  Family member/caregiver input: Not present        AWV Mini-Cog Result:  > 3 points - negative screen for dementia Pt completed clock activity successfully and recalled 3 of 3 words            Care Team:    Patient Care Team:  Sherie Don, MD as PCP - General (Family Medicine)  Patsey Berthold, MD as Consulting Physician (Cardiology)          Problem List:    Patient Active Problem List   Diagnosis    Vitamin D deficiency    High triglycerides    Low serum HDL             Current Meds:    No outpatient medications have been marked as taking for the 08/08/20 encounter (Office Visit) with Sherie Don, MD.          Allergies:    No Known Allergies          Past Surgical History/Past Medical History:    Past Surgical History:   Procedure Laterality Date    COLONOSCOPY  2010    age 39; 1 polyp repeat 5 years; has not scheduled f/u yet    HAND SURGERY   1992    L thumb    KNEE SURGERY  1983    L knee    ORTHOPEDIC SURGERY  1980    LEFT--torn ligaments       History reviewed. No pertinent past medical history.        Family History:    Family History   Problem Relation Age of Onset    Alzheimer's disease Mother     Lung cancer Father     Lung disease Father     Diabetes Sister     Heart disease Sister            Social History:    Social History     Tobacco Use    Smoking status: Never Smoker    Smokeless tobacco: Never Used   Haematologist Use: Never used   Substance Use Topics    Alcohol use: Yes     Alcohol/week: 2.0 standard drinks     Types: 1 Shots of liquor, 1 Glasses of wine per week     Comment: 1 drink monthly, 1 drink of wine Q6 months    Drug use: Never          The following sections were reviewed this encounter by the provider:   Tobacco   Allergies   Meds   Problems   Med Hx   Surg Hx   Fam Hx                 Vital Signs:      BP 145/82 (BP Site: Left arm, Patient Position: Sitting, Cuff Size: Medium)    Pulse 66    Temp 98.4 F (36.9 C) (Tympanic)    Ht 1.765 m (5' 9.5")    Wt 94.3 kg (208 lb)    BMI 30.28 kg/m          Physical Exam:    General: Patient is non-ill appearing and alert.          Assessment:    Medicare Wellness Visit  Plan:    As part of your wellness benefit, Medicare makes many screening tests available to you at no charge.  A complete list of these tests can be found at their website, [email or web address]. However, many of these tests or recommendations are out of date, or may not apply to you. After careful consideration of your own personal health needs, the following testing is recommended for you:      Preventive Service    Up-to-date (UTD)/Due/Not Applicable (N/A)   Last Done   Medicare Frequency   Body Mass Index   Up-to-date August 08, 2020  (BMI):Body mass index is 30.28 kg/m.   Height:Height: 176.5 cm (5' 9.5")  Weight:Weight:  94.3 kg (208 lb)  Annually   Blood Pressure: Up-to-date August 08, 2020    BP: 145/82    Every 2 yrs, if BP </= 120/80 mm hg   Annually, if BP >120-139/80-89 mm hg   Abdominal Aortic Aneurysm Screening Not applicable   Once, between the age range of 68-75 and smoked 100+ cigarettes in lifetime   Cholesterol Testing Due Lab Results   Component Value Date    LDL 133 (H) 07/19/2019      Regularly beginning at age 23 with risk factors   Diabetes Screening Due Lab Results   Component Value Date    GLU 89 07/19/2019       If prediabetes, one screening every 6 months   Otherwise, one screening every 12 months with certain risk factors for diabetes   Colorectal Cancer Screening Due   Annually, Fecal Occult Blood Stool (FOBS)   Every 5 yrs, Sigmoidoscopy with FOBS   Every 10 yrs, Colonoscopy   Every 3 yrs, Cologuard   Prostate Cancer Screening Due Lab Results   Component Value Date    PSA 1.0 07/19/2019     Frequency: annually for covered Medicare beneficiaries   Depression Screening Up-to-date August 08, 2020   As necessary for those with risk factors   Sexually Transmitted Diseases (STDs) & HIV Screening Not applicable   As necessary for those with risk factors   Alcohol Misuse Screening Up-to-date and Not applicable   As necessary for those with risk factors   Immunizations:   Shingrix is due Immunization History   Administered Date(s) Administered    COVID-19 mRNA Vaccine Preservative Free 0.3 mL (PFIZER) 11/27/2019, 12/21/2019, 07/20/2020    Influenza (Im) Preservative Free 08/05/2006, 08/07/2013, 08/06/2016    Influenza quadrivalent (IM) 6 months & up PRESERVED 07/27/2015, 08/04/2018, 07/19/2019    PPD Test 02/13/2005, 01/25/2014    Pneumococcal 23 valent 07/19/2019    TD ADULT, NOT ABSORBED 07/30/2006     Prevnar 13: 1 dose after age 71   Pneumovax 23: 1 dose 1 year after Prevnar   Influenza: Annually   Advance Directive Up-to-date   Once; update as needed   Medical Nutrition Therapy  Up-to-date   As necessary for diabetes or renal disease   Smoking Cessation Counseling Not applicable Counseling given: Not Answered    Frequency: two cessation attempts per year.   Glaucoma Screening Up-to-date   Annually for covered high risk Medicare beneficiaries (one of the following: DM, FHx Glaucoma, African-Americans aged 22+, Hispanic-Americans aged 65+)   Hepatitis C Virus (HCV) Screening Up-to-date   Annually only for high risk behavior   Once if born between [birth year] and 1967 and are not considered high risk   Lung Cancer Screening Due   Annually if asymptomatic, tobacco smoking history of at least 30 pack-years (one  pack-year = smoking one pack per day for one year; 1 pack = 20 cigarettes), and current smoker or one who has quit smoking within the last 15 years     Your major risk factors:       Hyperlipidemia     Recommendations for improvement:    Low cholesterol diet, Exercise, Weight management and Low carb diet     Referrals:    See After Visit Summary orders         No orders of the defined types were placed in this encounter.               Follow-up:    No follow-ups on file.         Sherie Don, MD

## 2020-08-08 NOTE — Progress Notes (Signed)
[facility] [place] [facility]                       Date of Exam: 08/08/2020 11:23 AM        Patient ID: Keith Hebert is a 64 y.o. male.  Attending Physician: Sherie Don, MD        Chief Complaint:    Chief Complaint   Patient presents with    CC/AWV               HPI:    64 year old male comes in for chronic care follow up.  He reports that he has been in overall good general health.      He does not smoke and has never smoked.    He has 1-2 drinks per week.    He is married.    He does maintain a healthy well balanced diet and does stay active with walking regularly.  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      He is up to date with Covid, Tdap, and Pneumovax vaccines.    He would like to get his flu shot.     He will get his Shingrix at his local pharmacy since he is on Medicare.      He will be getting colonoscopy done as well once the pandemic allows and he feels comfortable to do so.             Problem List:    Patient Active Problem List   Diagnosis    Vitamin D deficiency    High triglycerides    Low serum HDL             Current Meds:    No outpatient medications have been marked as taking for the 08/08/20 encounter (Office Visit) with Sherie Don, MD.          Allergies:    No Known Allergies          Past Surgical History:    Past Surgical History:   Procedure Laterality Date    COLONOSCOPY  2010    age 63; 1 polyp repeat 5 years; has not scheduled f/u yet    HAND SURGERY  1992    L thumb    KNEE SURGERY  1983    L knee    ORTHOPEDIC SURGERY  1980    LEFT--torn ligaments           Family History:    Family History   Problem Relation Age of Onset    Alzheimer's disease Mother     Lung cancer Father     Lung disease Father     Diabetes Sister     Heart disease Sister            Social History:    Social History     Tobacco Use    Smoking status: Never Smoker    Smokeless tobacco: Never Used   Financial controller Use: Never used   Substance Use Topics    Alcohol use: Yes     Alcohol/week: 2.0 standard drinks     Types: 1 Shots of liquor, 1 Glasses of wine per week     Comment: 1 drink monthly, 1 drink of wine Q6 months    Drug use: Never          The following sections were  reviewed this encounter by the provider:   Tobacco   Allergies   Meds   Problems   Med Hx   Surg Hx   Fam Hx              Vital Signs:    BP 122/80 (BP Site: Left arm, Patient Position: Sitting, Cuff Size: Medium)    Pulse 66    Temp 98.4 F (36.9 C) (Tympanic)    Ht 1.765 m (5' 9.5")    Wt 94.3 kg (208 lb)    BMI 30.28 kg/m          ROS:    As per HPI          Physical Exam:    Physical Exam  Constitutional:       Appearance: Normal appearance.   HENT:      Right Ear: Tympanic membrane, ear canal and external ear normal.      Left Ear: Tympanic membrane, ear canal and external ear normal.   Eyes:      General: No scleral icterus.     Conjunctiva/sclera: Conjunctivae normal.      Pupils: Pupils are equal, round, and reactive to light.   Neck:      Vascular: No carotid bruit.   Cardiovascular:      Rate and Rhythm: Normal rate and regular rhythm.      Pulses: Normal pulses.      Heart sounds: Normal heart sounds. No murmur heard.   No gallop.    Pulmonary:      Effort: Pulmonary effort is normal. No respiratory distress.      Breath sounds: Normal breath sounds. No wheezing, rhonchi or rales.   Abdominal:      General: Abdomen is flat. Bowel sounds are normal. There is no distension.      Palpations: Abdomen is soft. There is no mass.      Tenderness: There is no abdominal tenderness. There is no guarding.      Hernia: No hernia is present.   Musculoskeletal:         General: No swelling or tenderness. Normal range of motion.      Cervical back: Normal range of motion and neck supple. No muscular tenderness.      Right lower leg: No edema.      Left lower leg: No edema.   Lymphadenopathy:      Cervical: No cervical adenopathy.   Skin:      General: Skin is warm and dry.      Coloration: Skin is not jaundiced.      Findings: No rash.   Neurological:      General: No focal deficit present.      Mental Status: He is alert.      Cranial Nerves: No cranial nerve deficit.      Sensory: No sensory deficit.      Motor: No weakness.      Gait: Gait normal.   Psychiatric:         Mood and Affect: Mood normal.         Behavior: Behavior normal.              Assessment:    1. Elevated LDL cholesterol level    2. High triglycerides  - Comprehensive metabolic panel  - Lipid panel    3. Low serum HDL  - Comprehensive metabolic panel  - Lipid panel    4. Vitamin D deficiency  - Vitamin D,25 OH,  Total    5. Benign prostatic hyperplasia without lower urinary tract symptoms  - PSA    6. Prostate cancer screening  - PSA    7. Need for hepatitis C screening test  - Hepatitis C (HCV) antibody, Total    8. Needs flu shot  - Flu vaccine QUADRIVALENT (PF) 6 months and older (FLULAVAL/FLUARIX)            Plan:    1,2,3. HLD: Check fasting lipids.  Continue with efforts with healthy low fat/low cholesterol diet along with regular exercise to keep lipids at goals.  Discussed ASCVD guidelines and all questions answered.     4. Vitamin D: Check levels and adjust supplementation as needed.    5. BPH: Check PSA.  Symptoms not significant enough to treat at this time.  He will monitor.    6. PSA: prostate cancer screening discussed and all questions answered.  Patient would like to proceed with PSA testing.     7. HCV screening: Discussed recommendations and patient would like to proceed with screeing.      8. Flu shot.             Follow-up:    Return in about 1 year (around 08/08/2021) for Annual physical.         Sherie Don, MD

## 2020-08-09 DIAGNOSIS — N2581 Secondary hyperparathyroidism of renal origin: Secondary | ICD-10-CM | POA: Diagnosis not present

## 2020-08-09 DIAGNOSIS — Z23 Encounter for immunization: Secondary | ICD-10-CM | POA: Diagnosis not present

## 2020-08-09 DIAGNOSIS — N186 End stage renal disease: Secondary | ICD-10-CM | POA: Diagnosis not present

## 2020-08-09 DIAGNOSIS — D631 Anemia in chronic kidney disease: Secondary | ICD-10-CM | POA: Diagnosis not present

## 2020-08-09 DIAGNOSIS — Z992 Dependence on renal dialysis: Secondary | ICD-10-CM | POA: Diagnosis not present

## 2020-08-09 LAB — VITAMIN D,25 OH,TOTAL: Vitamin D 25-Hydroxy: 42.6 ng/mL (ref 30.0–100.0)

## 2020-08-09 LAB — COMPREHENSIVE METABOLIC PANEL
ALT: 37 IU/L (ref 0–44)
AST (SGOT): 20 IU/L (ref 0–40)
African American eGFR: 99 mL/min/{1.73_m2} (ref >59)
Albumin/Globulin Ratio: 1.8 (ref 1.2–2.2)
Albumin: 4.3 g/dL (ref 3.8–4.8)
Alkaline Phosphatase: 84 IU/L (ref 44–121)
BUN / Creatinine Ratio: 13 (ref 10–24)
BUN: 12 mg/dL (ref 8–27)
Bilirubin, Total: 0.3 mg/dL (ref 0.0–1.2)
CO2: 26 mmol/L (ref 20–29)
Calcium: 9.4 mg/dL (ref 8.6–10.2)
Chloride: 106 mmol/L (ref 96–106)
Creatinine: 0.93 mg/dL (ref 0.76–1.27)
Globulin, Total: 2.4 g/dL (ref 1.5–4.5)
Glucose: 99 mg/dL (ref 65–99)
Potassium: 4.2 mmol/L (ref 3.5–5.2)
Protein, Total: 6.7 g/dL (ref 6.0–8.5)
Sodium: 142 mmol/L (ref 134–144)
non-African American eGFR: 85 mL/min/{1.73_m2} (ref >59)

## 2020-08-09 LAB — LIPID PANEL
Cholesterol / HDL Ratio: 6.2 ratio — ABNORMAL HIGH (ref 0.0–5.0)
Cholesterol: 191 mg/dL (ref 100–199)
HDL: 31 mg/dL — ABNORMAL LOW (ref >39)
LDL Chol Calculated (NIH): 128 mg/dL — ABNORMAL HIGH (ref 0–99)
Triglycerides: 176 mg/dL — ABNORMAL HIGH (ref 0–149)
VLDL Calculated: 32 mg/dL (ref 5–40)

## 2020-08-09 LAB — HEPATITIS C ANTIBODY: HCV AB: <0.1 s/co ratio (ref 0.0–0.9)

## 2020-08-09 LAB — PROSTATE SPECIFIC ANTIGEN SCREEN: Prostate Specific Antigen, Total: 1.2 ng/mL (ref 0.0–4.0)

## 2020-08-10 ENCOUNTER — Telehealth (INDEPENDENT_AMBULATORY_CARE_PROVIDER_SITE_OTHER): Payer: Self-pay | Admitting: Family Medicine

## 2020-08-10 DIAGNOSIS — R748 Abnormal levels of other serum enzymes: Secondary | ICD-10-CM

## 2020-08-10 DIAGNOSIS — E781 Pure hyperglyceridemia: Secondary | ICD-10-CM

## 2020-08-10 DIAGNOSIS — E7800 Pure hypercholesterolemia, unspecified: Secondary | ICD-10-CM

## 2020-08-10 MED ORDER — ROSUVASTATIN CALCIUM 5 MG PO TABS
5.0000 mg | ORAL_TABLET | Freq: Every day | ORAL | 1 refills | Status: DC
Start: 2020-08-10 — End: 2021-02-08

## 2020-08-10 NOTE — Telephone Encounter (Signed)
Pt left VM stating that he is okay to take the the medication Crestor low dose because of her cholesterol. We would like his medication to be send to [id number] [place] [address].

## 2020-08-10 NOTE — Telephone Encounter (Signed)
Please call patient and let him know that Dr. Liliana Cline sent in the Crestor (blood pressure) medication [facility] today.     Thank you!

## 2020-08-10 NOTE — Telephone Encounter (Signed)
Called and spoke with pt.  He  said:  "  [facility] has contacted him, he got the Crestor (blood pressure) medication."

## 2020-08-11 DIAGNOSIS — N186 End stage renal disease: Secondary | ICD-10-CM | POA: Diagnosis not present

## 2020-08-11 DIAGNOSIS — Z992 Dependence on renal dialysis: Secondary | ICD-10-CM | POA: Diagnosis not present

## 2020-08-11 DIAGNOSIS — Z23 Encounter for immunization: Secondary | ICD-10-CM | POA: Diagnosis not present

## 2020-08-11 DIAGNOSIS — N2581 Secondary hyperparathyroidism of renal origin: Secondary | ICD-10-CM | POA: Diagnosis not present

## 2020-08-11 DIAGNOSIS — D631 Anemia in chronic kidney disease: Secondary | ICD-10-CM | POA: Diagnosis not present

## 2020-08-14 DIAGNOSIS — N2581 Secondary hyperparathyroidism of renal origin: Secondary | ICD-10-CM | POA: Diagnosis not present

## 2020-08-14 DIAGNOSIS — N186 End stage renal disease: Secondary | ICD-10-CM | POA: Diagnosis not present

## 2020-08-14 DIAGNOSIS — Z992 Dependence on renal dialysis: Secondary | ICD-10-CM | POA: Diagnosis not present

## 2020-08-14 DIAGNOSIS — Z23 Encounter for immunization: Secondary | ICD-10-CM | POA: Diagnosis not present

## 2020-08-14 DIAGNOSIS — D631 Anemia in chronic kidney disease: Secondary | ICD-10-CM | POA: Diagnosis not present

## 2020-08-16 DIAGNOSIS — Z992 Dependence on renal dialysis: Secondary | ICD-10-CM | POA: Diagnosis not present

## 2020-08-16 DIAGNOSIS — Z23 Encounter for immunization: Secondary | ICD-10-CM | POA: Diagnosis not present

## 2020-08-16 DIAGNOSIS — N2581 Secondary hyperparathyroidism of renal origin: Secondary | ICD-10-CM | POA: Diagnosis not present

## 2020-08-16 DIAGNOSIS — D631 Anemia in chronic kidney disease: Secondary | ICD-10-CM | POA: Diagnosis not present

## 2020-08-16 DIAGNOSIS — N186 End stage renal disease: Secondary | ICD-10-CM | POA: Diagnosis not present

## 2020-08-18 DIAGNOSIS — N186 End stage renal disease: Secondary | ICD-10-CM | POA: Diagnosis not present

## 2020-08-18 DIAGNOSIS — D631 Anemia in chronic kidney disease: Secondary | ICD-10-CM | POA: Diagnosis not present

## 2020-08-18 DIAGNOSIS — N2581 Secondary hyperparathyroidism of renal origin: Secondary | ICD-10-CM | POA: Diagnosis not present

## 2020-08-18 DIAGNOSIS — Z992 Dependence on renal dialysis: Secondary | ICD-10-CM | POA: Diagnosis not present

## 2020-08-18 DIAGNOSIS — Z23 Encounter for immunization: Secondary | ICD-10-CM | POA: Diagnosis not present

## 2020-08-21 DIAGNOSIS — Z23 Encounter for immunization: Secondary | ICD-10-CM | POA: Diagnosis not present

## 2020-08-21 DIAGNOSIS — D631 Anemia in chronic kidney disease: Secondary | ICD-10-CM | POA: Diagnosis not present

## 2020-08-21 DIAGNOSIS — N2581 Secondary hyperparathyroidism of renal origin: Secondary | ICD-10-CM | POA: Diagnosis not present

## 2020-08-21 DIAGNOSIS — Z992 Dependence on renal dialysis: Secondary | ICD-10-CM | POA: Diagnosis not present

## 2020-08-21 DIAGNOSIS — N186 End stage renal disease: Secondary | ICD-10-CM | POA: Diagnosis not present

## 2020-08-23 DIAGNOSIS — N2581 Secondary hyperparathyroidism of renal origin: Secondary | ICD-10-CM | POA: Diagnosis not present

## 2020-08-23 DIAGNOSIS — Z23 Encounter for immunization: Secondary | ICD-10-CM | POA: Diagnosis not present

## 2020-08-23 DIAGNOSIS — Z992 Dependence on renal dialysis: Secondary | ICD-10-CM | POA: Diagnosis not present

## 2020-08-23 DIAGNOSIS — N186 End stage renal disease: Secondary | ICD-10-CM | POA: Diagnosis not present

## 2020-08-23 DIAGNOSIS — D631 Anemia in chronic kidney disease: Secondary | ICD-10-CM | POA: Diagnosis not present

## 2020-08-25 DIAGNOSIS — N186 End stage renal disease: Secondary | ICD-10-CM | POA: Diagnosis not present

## 2020-08-25 DIAGNOSIS — N2581 Secondary hyperparathyroidism of renal origin: Secondary | ICD-10-CM | POA: Diagnosis not present

## 2020-08-25 DIAGNOSIS — Z992 Dependence on renal dialysis: Secondary | ICD-10-CM | POA: Diagnosis not present

## 2020-08-25 DIAGNOSIS — Z23 Encounter for immunization: Secondary | ICD-10-CM | POA: Diagnosis not present

## 2020-08-25 DIAGNOSIS — D631 Anemia in chronic kidney disease: Secondary | ICD-10-CM | POA: Diagnosis not present

## 2020-08-27 DIAGNOSIS — I129 Hypertensive chronic kidney disease with stage 1 through stage 4 chronic kidney disease, or unspecified chronic kidney disease: Secondary | ICD-10-CM | POA: Diagnosis not present

## 2020-08-27 DIAGNOSIS — Z992 Dependence on renal dialysis: Secondary | ICD-10-CM | POA: Diagnosis not present

## 2020-08-27 DIAGNOSIS — N186 End stage renal disease: Secondary | ICD-10-CM | POA: Diagnosis not present

## 2020-08-28 DIAGNOSIS — D631 Anemia in chronic kidney disease: Secondary | ICD-10-CM | POA: Diagnosis not present

## 2020-08-28 DIAGNOSIS — Z992 Dependence on renal dialysis: Secondary | ICD-10-CM | POA: Diagnosis not present

## 2020-08-28 DIAGNOSIS — N2581 Secondary hyperparathyroidism of renal origin: Secondary | ICD-10-CM | POA: Diagnosis not present

## 2020-08-28 DIAGNOSIS — Z23 Encounter for immunization: Secondary | ICD-10-CM | POA: Diagnosis not present

## 2020-08-28 DIAGNOSIS — N186 End stage renal disease: Secondary | ICD-10-CM | POA: Diagnosis not present

## 2020-08-28 NOTE — Progress Notes (Deleted)
Cardiology Office Note  Date:  08/28/2020   ID:  Kenneth [name], DOB 1956-03-26, MRN [id number]  PCP:  [name], MD  Cardiologist:  Dr. [name] _____________  EKG, hospital follow-up  _____________   History of Present Illness: Kenneth [name] is a 64 y.o. male with pmh of 3V CAD (medically managed since patient was not a candidate for CABG/high risk PCIs), diastolic CHF, heavy ETOH use, afib/flutter (not a/c candidate), ESRD on HD (TTS), HTN, HLD who is being seen for hospital follow-up. He was admitted 7/17-7/21 for chest pain during HD found to be hypotensive with EKG changes possibly consistent with STEMI. H was given NS and BP/EKG improved. HS troponin went up to 387. Echo showed EF 55-60%, no WMA, G1DD. Patient underwent cardiac cath showing severe MV CAD with severe calcific distal left main stenosis, severe diffuse ostial/proximal LAD stenosis, severe stenosis of the prox left circumflex/first OM, and interval occlusion of the distal RCA with L>R collaterals, heavily calcified distal abdominal aorta bilaterals iliac arteries with mild diffuse calcified disease. Medical management was recommended and DAPT with aspirin was continued. Also on statin and BB on non-dialysis days. He developed a hematoma at the cath site and US doppler was negative for pseudoaneurysm, AVR or DVT.   Today, he denies symptoms of palpitations, chest pain, shortness of breath, orthopnea, PND, lower extremity edema, claudication, dizziness, presyncope, syncope, bleeding, or neurologic sequela. The patient is tolerating medications without difficulties and is otherwise without complaint today.   Severe 3V CAD  Recent hospitalized for NSTEMI, troponin up to 387. EKG withough changes. Cath showed severe 3V CAD recommended medical management. Echo hsowed preserved EF. Continue aspirin, plavix, statin, BB on nondilaysis days  HTN Was hypotensive in the ER. Today  HLD Continue statin  ESRD on HD  Chronic  diastolic CHF Volume management per HD  Afib/flutter Not a candidate for anticoagulation  ETOH abuse Advised to quit _____________   Past Medical History:  Diagnosis Date  . Acute CHF ([place]) 01/2018  . Cardiomyopathy secondary    likely related to HTN heart disease; possibly ETOH related as well  . Chronic combined systolic and diastolic heart failure (HCC)    Echocardiogram 09/22/11: Moderate LVH, EF 91-63%, grade 3 diastolic dysfunction, mild MR, moderate to severe LAE, mild RVE, mild to moderate TR, small to moderate pericardial effusion  . Coronary artery disease    not felt to be candidate for CABG 08/2018; medical thearpy recomended due to high risk of PCI   . Dysrhythmia    aflutter 04/2016, afib 02/2018, not felt to be a candidate for anticoagulation due to non-compliance and ETOH  . ESRD (end stage renal disease) on dialysis ([place])    due to hypertensive nephrosclerosis; TTS; [address] (09/01/2018)  . History of alcohol abuse   . Hypertension   . Myocardial infarction [facility])    " mild " per daughter  . PEA (Pulseless electrical activity) ([place])    PEA arrest 03/09/18, treated empirically for hyperkalemia, shock x1 for WCT, given amiodarone, ROSC after 10 min of ACLS   Past Surgical History:  Procedure Laterality Date  . AV FISTULA PLACEMENT Left 05/14/2016   Procedure: LEFT ARM BASILIC VEIN TRANSPOSITION;  Surgeon: Rosetta Posner, MD;  Location: [place];  Service: Vascular;  Laterality: Left;  . INSERTION OF DIALYSIS CATHETER N/A 06/20/2019   Procedure: INSERTION OF TUNNELED  DIALYSIS CATHETER;  Surgeon: Elam Dutch, MD;  Location: [place];  Service: Vascular;  Laterality:  N/A;  . LEFT HEART CATH AND CORONARY ANGIOGRAPHY N/A 09/02/2018   Procedure: LEFT HEART CATH AND CORONARY ANGIOGRAPHY;  Surgeon: Nelva Bush, MD;  Location: [place] CV [facility];  Service: Cardiovascular;  Laterality: N/A;  . LEFT HEART CATH AND CORONARY ANGIOGRAPHY N/A 05/14/2020   Procedure: LEFT HEART CATH  AND CORONARY ANGIOGRAPHY;  Surgeon: Sherren Mocha, MD;  Location: [place] [facility];  Service: Cardiovascular;  Laterality: N/A;  . PERIPHERAL VASCULAR CATHETERIZATION N/A 05/13/2016   Procedure: Dialysis/Perma Catheter Insertion;  Surgeon: Serafina Mitchell, MD;  Location: [place] [facility];  Service: Cardiovascular;  Laterality: N/A;  . REVISION OF ARTERIOVENOUS GORETEX GRAFT Left 9/67/8938   Procedure: PLICATION OF ARTERIOVENOUS FISTULA LEFT ARM;  Surgeon: Elam Dutch, MD;  Location: [facility];  Service: Vascular;  Laterality: Left;   _____________  Current Outpatient Medications  Medication Sig Dispense Refill  . aspirin EC 81 MG EC tablet Take 1 tablet (81 mg total) by mouth daily. Swallow whole. 30 tablet 11  . atorvastatin (LIPITOR) 80 MG tablet Take 1 tablet (80 mg total) by mouth daily. 90 tablet 3  . clonazePAM (KLONOPIN) 0.5 MG tablet Take 0.5 mg by mouth daily.     . clopidogrel (PLAVIX) 75 MG tablet Take 1 tablet by mouth once daily 90 tablet 3  . diphenhydramine-acetaminophen (TYLENOL PM EXTRA STRENGTH) 25-500 MG TABS tablet Take 2 tablets by mouth at bedtime as needed (Pain, Sleep).     . isosorbide mononitrate (IMDUR) 60 MG 24 hr tablet Take 1 tablet (60 mg total) by mouth daily. 30 tablet 8  . metoprolol succinate (TOPROL-XL) 25 MG 24 hr tablet Take 1 tablet (25 mg total) by mouth 4 (four) times a week. Take on Sunday, Monday, Wednesday, Friday 30 tablet 8  . nitroGLYCERIN (NITROSTAT) 0.4 MG SL tablet Place 1 tablet (0.4 mg total) under the tongue every 5 (five) minutes x 3 doses as needed for chest pain. 30 tablet 0   No current facility-administered medications for this visit.   _____________   Allergies:   Patient has no known allergies.  _____________   Social History:  The patient  reports that he quit smoking about 2 years ago. His smoking use included cigars. He has a 96.00 pack-year smoking history. He has quit using smokeless tobacco.  His smokeless tobacco use  included chew. He reports previous alcohol use. He reports that he does not use drugs.  _____________   Family History:  The patient's family history includes Cirrhosis in his father; Emphysema in his mother.  _____________   ROS:  Please see the history of present illness.   Positive for ***,   All other systems are reviewed and negative.  _____________   PHYSICAL EXAM: VS:  There were no vitals taken for this visit. , BMI There is no height or weight on file to calculate BMI. GEN: Well nourished, well developed, in no acute distress  HEENT: normal  Neck: no JVD, carotid bruits, or masses Cardiac: RRR; no murmurs, rubs, or gallops. No clubbing, cyanosis, edema.  Radials/DP/PT 2+ and equal bilaterally.  Respiratory:  clear to auscultation bilaterally, normal work of breathing GI: soft, nontender, nondistended, + BS MS: no deformity or atrophy  Skin: warm and dry, no rash Neuro:  Strength and sensation are intact Psych: euthymic mood, full affect _____________  EKG:   The ekg ordered today shows ***  Recent Labs: 05/16/2020: ALT 15; BUN 31; Creatinine, Ser 10.26; Hemoglobin 10.1; Magnesium 1.9; Platelets 192; Potassium 4.3;  Sodium 137  05/12/2020: Cholesterol 170; HDL 40; LDL Cholesterol 113; Total CHOL/HDL Ratio 4.3; Triglycerides 84; VLDL 17  CrCl cannot be calculated (Patient's most recent lab result is older than the maximum 21 days allowed.).  Wt Readings from Last 3 Encounters:  05/16/20 191 lb 10 oz (86.9 kg)  02/24/20 203 lb 9.6 oz (92.4 kg)  02/22/20 207 lb 3.2 oz (94 kg)    _____________   ASSESSMENT AND PLAN:     Disposition:   FU with ***   Signed, Tahjir Silveria Ninfa Meeker, PA-C 08/28/2020 8:28 PM    _____________ [address] [place] [address]  [phone] (office) [phone] (fax)

## 2020-08-29 ENCOUNTER — Ambulatory Visit: Payer: Medicare Other | Admitting: Cardiology

## 2020-08-29 DIAGNOSIS — I5032 Chronic diastolic (congestive) heart failure: Secondary | ICD-10-CM

## 2020-08-29 DIAGNOSIS — E785 Hyperlipidemia, unspecified: Secondary | ICD-10-CM

## 2020-08-29 DIAGNOSIS — I25119 Atherosclerotic heart disease of native coronary artery with unspecified angina pectoris: Secondary | ICD-10-CM

## 2020-08-29 DIAGNOSIS — I10 Essential (primary) hypertension: Secondary | ICD-10-CM

## 2020-08-30 DIAGNOSIS — N2581 Secondary hyperparathyroidism of renal origin: Secondary | ICD-10-CM | POA: Diagnosis not present

## 2020-08-30 DIAGNOSIS — Z23 Encounter for immunization: Secondary | ICD-10-CM | POA: Diagnosis not present

## 2020-08-30 DIAGNOSIS — N186 End stage renal disease: Secondary | ICD-10-CM | POA: Diagnosis not present

## 2020-08-30 DIAGNOSIS — D631 Anemia in chronic kidney disease: Secondary | ICD-10-CM | POA: Diagnosis not present

## 2020-08-30 DIAGNOSIS — Z992 Dependence on renal dialysis: Secondary | ICD-10-CM | POA: Diagnosis not present

## 2020-09-02 ENCOUNTER — Emergency Department (HOSPITAL_COMMUNITY)
Admission: EM | Admit: 2020-09-02 | Discharge: 2020-09-03 | Disposition: A | Discharge status: Home / Self Care | Payer: Medicare Other | Attending: Emergency Medicine | Admitting: Emergency Medicine

## 2020-09-02 ENCOUNTER — Emergency Department (HOSPITAL_COMMUNITY): Payer: Medicare Other

## 2020-09-02 ENCOUNTER — Other Ambulatory Visit: Payer: Self-pay

## 2020-09-02 ENCOUNTER — Other Ambulatory Visit: Payer: Self-pay | Admitting: Cardiology

## 2020-09-02 DIAGNOSIS — Z20822 Contact with and (suspected) exposure to covid-19: Secondary | ICD-10-CM | POA: Insufficient documentation

## 2020-09-02 DIAGNOSIS — N186 End stage renal disease: Secondary | ICD-10-CM | POA: Insufficient documentation

## 2020-09-02 DIAGNOSIS — Z79899 Other long term (current) drug therapy: Secondary | ICD-10-CM | POA: Diagnosis not present

## 2020-09-02 DIAGNOSIS — Z7982 Long term (current) use of aspirin: Secondary | ICD-10-CM | POA: Diagnosis not present

## 2020-09-02 DIAGNOSIS — I2510 Atherosclerotic heart disease of native coronary artery without angina pectoris: Secondary | ICD-10-CM | POA: Diagnosis not present

## 2020-09-02 DIAGNOSIS — R0789 Other chest pain: Secondary | ICD-10-CM | POA: Diagnosis not present

## 2020-09-02 DIAGNOSIS — I132 Hypertensive heart and chronic kidney disease with heart failure and with stage 5 chronic kidney disease, or end stage renal disease: Secondary | ICD-10-CM | POA: Insufficient documentation

## 2020-09-02 DIAGNOSIS — E875 Hyperkalemia: Secondary | ICD-10-CM | POA: Diagnosis not present

## 2020-09-02 DIAGNOSIS — I5042 Chronic combined systolic (congestive) and diastolic (congestive) heart failure: Secondary | ICD-10-CM | POA: Insufficient documentation

## 2020-09-02 DIAGNOSIS — Z87891 Personal history of nicotine dependence: Secondary | ICD-10-CM | POA: Diagnosis not present

## 2020-09-02 DIAGNOSIS — Z7901 Long term (current) use of anticoagulants: Secondary | ICD-10-CM | POA: Diagnosis not present

## 2020-09-02 DIAGNOSIS — N250 Renal osteodystrophy: Secondary | ICD-10-CM | POA: Diagnosis not present

## 2020-09-02 DIAGNOSIS — R609 Edema, unspecified: Secondary | ICD-10-CM | POA: Insufficient documentation

## 2020-09-02 DIAGNOSIS — I509 Heart failure, unspecified: Secondary | ICD-10-CM | POA: Diagnosis not present

## 2020-09-02 DIAGNOSIS — I517 Cardiomegaly: Secondary | ICD-10-CM | POA: Diagnosis not present

## 2020-09-02 DIAGNOSIS — R079 Chest pain, unspecified: Secondary | ICD-10-CM | POA: Insufficient documentation

## 2020-09-02 DIAGNOSIS — Z992 Dependence on renal dialysis: Secondary | ICD-10-CM | POA: Diagnosis not present

## 2020-09-02 LAB — CBC WITH DIFFERENTIAL/PLATELET
Abs Immature Granulocytes: 0.03 10*3/uL (ref 0.00–0.07)
Basophils Absolute: 0.1 10*3/uL (ref 0.0–0.1)
Basophils Relative: 1 %
Eosinophils Absolute: 1.1 10*3/uL — ABNORMAL HIGH (ref 0.0–0.5)
Eosinophils Relative: 11 %
HCT: 34.2 % — ABNORMAL LOW (ref 39.0–52.0)
Hemoglobin: 11.5 g/dL — ABNORMAL LOW (ref 13.0–17.0)
Immature Granulocytes: 0 %
Lymphocytes Relative: 15 %
Lymphs Abs: 1.6 10*3/uL (ref 0.7–4.0)
MCH: 32.1 pg (ref 26.0–34.0)
MCHC: 33.6 g/dL (ref 30.0–36.0)
MCV: 95.5 fL (ref 80.0–100.0)
Monocytes Absolute: 0.9 10*3/uL (ref 0.1–1.0)
Monocytes Relative: 8 %
Neutro Abs: 6.8 10*3/uL (ref 1.7–7.7)
Neutrophils Relative %: 65 %
Platelets: 295 10*3/uL (ref 150–400)
RBC: 3.58 MIL/uL — ABNORMAL LOW (ref 4.22–5.81)
RDW: 17.2 % — ABNORMAL HIGH (ref 11.5–15.5)
WBC: 10.4 10*3/uL (ref 4.0–10.5)
nRBC: 0 % (ref 0.0–0.2)

## 2020-09-02 NOTE — ED Triage Notes (Signed)
Pt arrived to ED via [facility] w/ c/o CP onset 2200. Pt c/o L sided sharp CP w/o radiation which improved some w/ sitting still. Pt took 324mg  ASA prior to EMS arrival. EMS gave 2 SL nitro tablets and pt is now CP free. Pt is dialysis pt T, TH, Sat and missed dialysis yesterday. Pt reports that he went to dialysis yesterday, didn't think anyone was there and left.  20G R AC placed by EMS

## 2020-09-02 NOTE — ED Provider Notes (Signed)
[facility] EMERGENCY DEPARTMENT Provider Note   CSN: [id number] Arrival date & time: 09/02/20  2220     History Chief Complaint  Patient presents with  . Chest Pain    Kenneth [name] is a 64 y.o. male.  HPI Patient presents with sharp left-sided chest pain.  Began at around 10:00 tonight.  States it does feel like previous cardiac pain.  No radiation.  Given aspirin and nitroglycerin by EMS and pain improved.  History of coronary artery disease.  He has severe disease is not a good candidate for CABG or stenting.  He is also a dialysis patient.  States that he has not been dialyzed for his last 2 sessions.  He was dialyzed on Tuesday but missed Thursday and yesterday.  States he does feel like he is got too much fluid on him.  Some swelling in his legs.  No fevers.  No cough.  Has had his Covid vaccines.    Past Medical History:  Diagnosis Date  . Acute CHF ([place]) 01/2018  . Cardiomyopathy secondary    likely related to HTN heart disease; possibly ETOH related as well  . Chronic combined systolic and diastolic heart failure (HCC)    Echocardiogram 09/22/11: Moderate LVH, EF 71-24%, grade 3 diastolic dysfunction, mild MR, moderate to severe LAE, mild RVE, mild to moderate TR, small to moderate pericardial effusion  . Coronary artery disease    not felt to be candidate for CABG 08/2018; medical thearpy recomended due to high risk of PCI   . Dysrhythmia    aflutter 04/2016, afib 02/2018, not felt to be a candidate for anticoagulation due to non-compliance and ETOH  . ESRD (end stage renal disease) on dialysis ([place])    due to hypertensive nephrosclerosis; TTS; [address] (09/01/2018)  . History of alcohol abuse   . Hypertension   . Myocardial infarction [facility])    " mild " per daughter  . PEA (Pulseless electrical activity) ([place])    PEA arrest 03/09/18, treated empirically for hyperkalemia, shock x1 for WCT, given amiodarone, ROSC after 10 min of ACLS    Patient Active  Problem List   Diagnosis Date Noted  . NSTEMI (non-ST elevated myocardial infarction) ([place]) 05/12/2020  . Non-ST elevated myocardial infarction ([place]) 05/12/2020  . DNR (do not resuscitate) discussion   . Palliative care by specialist   . Goals of care, counseling/discussion   . Coronary artery disease involving native heart with angina pectoris ([place])   . STEMI (ST elevation myocardial infarction) ([place]) 09/17/2018  . Unstable angina ([place])   . ACS (acute coronary syndrome) ([place]) 09/01/2018  . ESRD (end stage renal disease) ([place]) 07/20/2018  . Acute respiratory failure ([place]) 05/11/2018  . Cardiac arrest ([place]) 03/09/2018  . Acute encephalopathy   . Acute on chronic respiratory failure ([place]) 02/16/2018  . Tobacco dependence 02/16/2018  . Pulmonary edema 01/26/2018  . Acute respiratory failure with hypoxia ([place]) 01/26/2018  . Atrial fibrillation and flutter ([place])   . Shortness of breath   . Acute on chronic diastolic CHF (congestive heart failure) ([place])   . Hypothermia 10/08/2016  . ESRD on hemodialysis ([place]) 10/08/2016  . Fall 10/08/2016  . Syncope 10/07/2016  . Near syncope 07/07/2016  . Cardiomyopathy ([place]) 07/07/2016  . Ventricular tachycardia, non-sustained ([place]) 07/06/2016  . Hypoglycemia 07/06/2016  . CKD (chronic kidney disease) stage 5, GFR less than 15 ml/min (HCC)   . Alcoholism ([place]) 05/13/2016  . Hypertensive emergency 12/08/2013  . Cardiomyopathy secondary 10/09/2011  .  Essential hypertension 10/09/2011  . Alcohol withdrawal delirium ([place]) 09/25/2011  . Chronic diastolic CHF (congestive heart failure) ([place]) 09/24/2011    Past Surgical History:  Procedure Laterality Date  . AV FISTULA PLACEMENT Left 05/14/2016   Procedure: LEFT ARM BASILIC VEIN TRANSPOSITION;  Surgeon: Rosetta Posner, MD;  Location: [facility];  Service: Vascular;  Laterality: Left;  . INSERTION OF DIALYSIS CATHETER N/A 06/20/2019   Procedure: INSERTION OF TUNNELED  DIALYSIS CATHETER;  Surgeon: Elam Dutch, MD;  Location: [facility];  Service: Vascular;  Laterality: N/A;  . LEFT HEART CATH AND CORONARY ANGIOGRAPHY N/A 09/02/2018   Procedure: LEFT HEART CATH AND CORONARY ANGIOGRAPHY;  Surgeon: Nelva Bush, MD;  Location: [facility] CV LAB;  Service: Cardiovascular;  Laterality: N/A;  . LEFT HEART CATH AND CORONARY ANGIOGRAPHY N/A 05/14/2020   Procedure: LEFT HEART CATH AND CORONARY ANGIOGRAPHY;  Surgeon: Sherren Mocha, MD;  Location: [facility] CV LAB;  Service: Cardiovascular;  Laterality: N/A;  . PERIPHERAL VASCULAR CATHETERIZATION N/A 05/13/2016   Procedure: Dialysis/Perma Catheter Insertion;  Surgeon: Serafina [place], MD;  Location: [facility] CV LAB;  Service: Cardiovascular;  Laterality: N/A;  . REVISION OF ARTERIOVENOUS GORETEX GRAFT Left 02/02/8118   Procedure: PLICATION OF ARTERIOVENOUS FISTULA LEFT ARM;  Surgeon: Elam Dutch, MD;  Location: [facility] OR;  Service: Vascular;  Laterality: Left;       Family History  Problem Relation Age of Onset  . Emphysema Mother   . Cirrhosis Father     Social History   Tobacco Use  . Smoking status: Former Smoker    Packs/day: 2.00    Years: 48.00    Pack years: 96.00    Types: Cigars    Quit date: 10/27/2017    Years since quitting: 2.8  . Smokeless tobacco: Former Systems developer    Types: Secondary school teacher  . Vaping Use: Never used  Substance Use Topics  . Alcohol use: Not Currently  . Drug use: No    Home Medications Prior to Admission medications   Medication Sig Start Date End Date Taking? Authorizing Provider  aspirin EC 81 MG EC tablet Take 1 tablet (81 mg total) by mouth daily. Swallow whole. 05/16/20  Yes Alma Friendly, MD  atorvastatin (LIPITOR) 80 MG tablet Take 1 tablet (80 mg total) by mouth daily. 03/16/20  Yes [name], [name], MD  clonazePAM (KLONOPIN) 0.5 MG tablet Take 0.5 mg by mouth daily.  02/11/18  Yes [provider]  clopidogrel (PLAVIX) 75 MG tablet Take 1 tablet by mouth once daily Patient  taking differently: Take 75 mg by mouth daily.  05/17/20  Yes Meng, Isaac Laud, PA  diphenhydramine-acetaminophen (TYLENOL PM EXTRA STRENGTH) 25-500 MG TABS tablet Take 2 tablets by mouth at bedtime as needed (Pain, Sleep).  05/12/18  Yes [provider]  isosorbide mononitrate (IMDUR) 60 MG 24 hr tablet Take 1 tablet (60 mg total) by mouth daily. 06/28/20  Yes [name], [name], MD  metoprolol succinate (TOPROL-XL) 25 MG 24 hr tablet Take 1 tablet (25 mg total) by mouth 4 (four) times a week. Take on Sunday, Monday, Wednesday, Friday 06/26/20  Yes [name], [name], MD  nitroGLYCERIN (NITROSTAT) 0.4 MG SL tablet Place 1 tablet (0.4 mg total) under the tongue every 5 (five) minutes x 3 doses as needed for chest pain. 07/20/20  Yes [name], [name], MD    Allergies    Patient has no known allergies.  Review of Systems   Review of Systems  Constitutional: Negative for appetite change and fever.  HENT: Negative for congestion.   Respiratory: Positive for shortness of breath.   Cardiovascular: Positive for chest pain. Negative for leg swelling.  Gastrointestinal: Negative for abdominal pain.  Genitourinary: Negative for flank pain.  Musculoskeletal: Negative for back pain.  Skin: Negative for rash.  Neurological: Negative for weakness.  Psychiatric/Behavioral: Negative for confusion.    Physical Exam Updated Vital Signs BP (!) 187/94   Pulse 88   Temp 97.7 F (36.5 C) (Oral)   Resp (!) 27   Ht 5\' 11"  (1.803 m)   Wt 90.7 kg   SpO2 92%   BMI 27.89 kg/m   Physical Exam Vitals and nursing note reviewed.  HENT:     Head: Atraumatic.  Cardiovascular:     Rate and Rhythm: Normal rate and regular rhythm.  Pulmonary:     Breath sounds: Rales present. No rhonchi.     Comments: Rales bilateral bases without respiratory distress. Chest:     Chest wall: No tenderness or crepitus.  Abdominal:     Tenderness: There is no abdominal tenderness.  Musculoskeletal:     Right lower leg: Edema  present.     Left lower leg: Edema present.  Skin:    General: Skin is warm.     Capillary Refill: Capillary refill takes less than 2 seconds.  Neurological:     Mental Status: He is alert and oriented to person, place, and time.     ED Results / Procedures / Treatments   Labs (all labs ordered are listed, but only abnormal results are displayed) Labs Reviewed  CBC WITH DIFFERENTIAL/PLATELET - Abnormal; Notable for the following components:      Result Value   RBC 3.58 (*)    Hemoglobin 11.5 (*)    HCT 34.2 (*)    RDW 17.2 (*)    Eosinophils Absolute 1.1 (*)    All other components within normal limits  RESPIRATORY PANEL BY RT PCR (FLU A&B, COVID)  COMPREHENSIVE METABOLIC PANEL  TROPONIN I (HIGH SENSITIVITY)    EKG EKG Interpretation  Date/Time:  Sunday September 02 2020 22:30:39 EST Ventricular Rate:  86 PR Interval:    QRS Duration: 98 QT Interval:  389 QTC Calculation: 466 R Axis:   1 Text Interpretation: Sinus rhythm Anteroseptal infarct, old Confirmed by Davonna Belling [phone]) on 09/02/2020 10:52:50 PM   Radiology DG Chest Portable 1 View  Result Date: 09/02/2020 CLINICAL DATA:  Chest pain EXAM: PORTABLE CHEST 1 VIEW COMPARISON:  May 12, 2020 FINDINGS: There is cardiomegaly. Aortic calcifications are noted. There is no pneumothorax. No large pleural effusion. Chronic prominent interstitial lung markings are again noted. There is no definite acute osseous abnormality. IMPRESSION: No active disease. Electronically Signed   By: Constance Holster M.D.   On: 09/02/2020 23:08    Procedures Procedures (including critical care time)  Medications Ordered in ED Medications - No data to display  ED Course  I have reviewed the triage vital signs and the nursing notes.  Pertinent labs & imaging results that were available during my care of the patient were reviewed by me and considered in my medical decision making (see chart for details).    MDM  Rules/Calculators/A&P                          Patient with chest pain.  Had been sharp and left-sided but felt like previous anginal pain.  Has known severe coronary  artery disease that is not amenable to either CABG or stenting.  Also has been noncompliant with dialysis.  Likely has some volume overload and that is got some rales bilaterally.EKG reassuring.  Now pain-free. Care turned over to Dr. Dayna Barker. Final Clinical Impression(s) / ED Diagnoses Final diagnoses:  None    Rx / DC Orders ED Discharge Orders    None       Davonna Belling, MD 09/03/20 0002

## 2020-09-03 ENCOUNTER — Other Ambulatory Visit: Payer: Self-pay | Admitting: Cardiology

## 2020-09-03 DIAGNOSIS — E875 Hyperkalemia: Secondary | ICD-10-CM | POA: Diagnosis not present

## 2020-09-03 DIAGNOSIS — I132 Hypertensive heart and chronic kidney disease with heart failure and with stage 5 chronic kidney disease, or end stage renal disease: Secondary | ICD-10-CM | POA: Diagnosis not present

## 2020-09-03 DIAGNOSIS — Z992 Dependence on renal dialysis: Secondary | ICD-10-CM | POA: Diagnosis not present

## 2020-09-03 DIAGNOSIS — N186 End stage renal disease: Secondary | ICD-10-CM | POA: Diagnosis not present

## 2020-09-03 DIAGNOSIS — I2510 Atherosclerotic heart disease of native coronary artery without angina pectoris: Secondary | ICD-10-CM | POA: Diagnosis not present

## 2020-09-03 DIAGNOSIS — R0789 Other chest pain: Secondary | ICD-10-CM | POA: Diagnosis not present

## 2020-09-03 DIAGNOSIS — I509 Heart failure, unspecified: Secondary | ICD-10-CM | POA: Diagnosis not present

## 2020-09-03 DIAGNOSIS — N250 Renal osteodystrophy: Secondary | ICD-10-CM | POA: Diagnosis not present

## 2020-09-03 DIAGNOSIS — R079 Chest pain, unspecified: Secondary | ICD-10-CM | POA: Diagnosis not present

## 2020-09-03 LAB — RENAL FUNCTION PANEL
Albumin: 3.1 g/dL — ABNORMAL LOW (ref 3.5–5.0)
Anion gap: 16 — ABNORMAL HIGH (ref 5–15)
BUN: 93 mg/dL — ABNORMAL HIGH (ref 8–23)
CO2: 22 mmol/L (ref 22–32)
Calcium: 9 mg/dL (ref 8.9–10.3)
Chloride: 100 mmol/L (ref 98–111)
Creatinine, Ser: 13.19 mg/dL — ABNORMAL HIGH (ref 0.61–1.24)
GFR, Estimated: 4 mL/min — ABNORMAL LOW (ref >60)
Glucose, Bld: 136 mg/dL — ABNORMAL HIGH (ref 70–99)
Phosphorus: 5.2 mg/dL — ABNORMAL HIGH (ref 2.5–4.6)
Potassium: 4.5 mmol/L (ref 3.5–5.1)
Sodium: 138 mmol/L (ref 135–145)

## 2020-09-03 LAB — COMPREHENSIVE METABOLIC PANEL
ALT: 8 U/L (ref 0–44)
AST: 15 U/L (ref 15–41)
Albumin: 3.2 g/dL — ABNORMAL LOW (ref 3.5–5.0)
Alkaline Phosphatase: 62 U/L (ref 38–126)
Anion gap: 19 — ABNORMAL HIGH (ref 5–15)
BUN: 105 mg/dL — ABNORMAL HIGH (ref 8–23)
CO2: 21 mmol/L — ABNORMAL LOW (ref 22–32)
Calcium: 9.1 mg/dL (ref 8.9–10.3)
Chloride: 98 mmol/L (ref 98–111)
Creatinine, Ser: 14.5 mg/dL — ABNORMAL HIGH (ref 0.61–1.24)
GFR, Estimated: 3 mL/min — ABNORMAL LOW (ref >60)
Glucose, Bld: 86 mg/dL (ref 70–99)
Potassium: 6.1 mmol/L — ABNORMAL HIGH (ref 3.5–5.1)
Sodium: 138 mmol/L (ref 135–145)
Total Bilirubin: 0.4 mg/dL (ref 0.3–1.2)
Total Protein: 7.5 g/dL (ref 6.5–8.1)

## 2020-09-03 LAB — TROPONIN I (HIGH SENSITIVITY)
Troponin I (High Sensitivity): 31 ng/L — ABNORMAL HIGH (ref <18)
Troponin I (High Sensitivity): 31 ng/L — ABNORMAL HIGH (ref <18)

## 2020-09-03 LAB — CBG MONITORING, ED
Glucose-Capillary: 60 mg/dL — ABNORMAL LOW (ref 70–99)
Glucose-Capillary: 68 mg/dL — ABNORMAL LOW (ref 70–99)
Glucose-Capillary: 74 mg/dL (ref 70–99)

## 2020-09-03 LAB — RESPIRATORY PANEL BY RT PCR (FLU A&B, COVID)
Influenza A by PCR: NEGATIVE
Influenza B by PCR: NEGATIVE
SARS Coronavirus 2 by RT PCR: NEGATIVE

## 2020-09-03 MED ORDER — CHLORHEXIDINE GLUCONATE CLOTH 2 % EX PADS: 6.0000 | MEDICATED_PAD | Freq: Every day | CUTANEOUS | Status: DC

## 2020-09-03 MED ORDER — HEPARIN SODIUM (PORCINE) 1000 UNIT/ML DIALYSIS: 2400.0000 [IU] | Freq: Once | INTRAMUSCULAR | Status: DC

## 2020-09-03 MED ORDER — NITROGLYCERIN 0.4 MG SL SUBL
0.4000 mg | SUBLINGUAL_TABLET | SUBLINGUAL | Status: DC | PRN
  Administered 2020-09-03 (×2): 0.4 mg via SUBLINGUAL
  Filled 2020-09-03: qty 1

## 2020-09-03 MED ORDER — ISOSORBIDE MONONITRATE ER 30 MG PO TB24: 30.0000 mg | ORAL_TABLET | Freq: Every day | ORAL | Status: DC

## 2020-09-03 MED ORDER — SODIUM ZIRCONIUM CYCLOSILICATE 10 G PO PACK
10.0000 g | PACK | Freq: Once | ORAL | Status: AC
  Administered 2020-09-03: 10 g via ORAL
  Filled 2020-09-03: qty 1

## 2020-09-03 MED ORDER — METOPROLOL SUCCINATE ER 25 MG PO TB24
25.0000 mg | ORAL_TABLET | Freq: Every day | ORAL | Status: DC
  Administered 2020-09-03: 25 mg via ORAL

## 2020-09-03 MED ORDER — METOPROLOL SUCCINATE ER 25 MG PO TB24
25.0000 mg | ORAL_TABLET | Freq: Every day | ORAL | Status: DC
  Filled 2020-09-03: qty 1

## 2020-09-03 MED ORDER — CALCIUM GLUCONATE 10 % IV SOLN: 1.0000 g | Freq: Once | INTRAVENOUS | Status: DC

## 2020-09-03 NOTE — ED Notes (Signed)
Pt informs this nurse he uses [facility] for transportation, this nurse reached out to case management to arrange.

## 2020-09-03 NOTE — ED Provider Notes (Signed)
Patient returned from dialysis to the emergency department.  On my exam patient is well-appearing, in NAD  He is asymptomatic, he has stable vital signs and is back to baseline.  Patient is eager to be discharged home.  Recommend he follow-up outpatient with pcp and discussed strict return precautions.   Portions of this note were generated with Lobbyist. Dictation errors may occur despite best attempts at proofreading.    Barrie Folk, PA-C 09/03/20 1404    Pattricia Boss, MD 09/05/20 1310

## 2020-09-03 NOTE — Consult Note (Signed)
[facility] Renal Consultation Note    Indication for Consultation:  Management of ESRD/hemodialysis; anemia, hypertension/volume and secondary hyperparathyroidism  HPI: Kenneth [name] is a 64 y.o. male with ESRD on HD TTS, HTN, NICM, CHF, severe CAD, prior NSTEMI. Presented to the ED with chest pain that started the prior evening. EKG without acute changes. No edema or infiltrates on CXR. Troponins flat. Labs notable for hyperkalemia. K+6.1. Nephrology consulted for urgent dialysis.   Dialyzes TTS at [facility]. Last dialysis was Thursday 11/4. He missed dialysis Saturday he says because he transportation did not arrive.   Seen and examined in HD unit. Hypertensive at the start of treatment. He endorses chest pain and SOB on 2L [place]. Attempting 4L UF this am.   Past Medical History:  Diagnosis Date  . Acute CHF ([place]) 01/2018  . Cardiomyopathy secondary    likely related to HTN heart disease; possibly ETOH related as well  . Chronic combined systolic and diastolic heart failure (HCC)    Echocardiogram 09/22/11: Moderate LVH, EF 92-11%, grade 3 diastolic dysfunction, mild MR, moderate to severe LAE, mild RVE, mild to moderate TR, small to moderate pericardial effusion  . Coronary artery disease    not felt to be candidate for CABG 08/2018; medical thearpy recomended due to high risk of PCI   . Dysrhythmia    aflutter 04/2016, afib 02/2018, not felt to be a candidate for anticoagulation due to non-compliance and ETOH  . ESRD (end stage renal disease) on dialysis ([place])    due to hypertensive nephrosclerosis; TTS; [facility] (09/01/2018)  . History of alcohol abuse   . Hypertension   . Myocardial infarction [facility])    " mild " per daughter  . PEA (Pulseless electrical activity) ([place])    PEA arrest 03/09/18, treated empirically for hyperkalemia, shock x1 for WCT, given amiodarone, ROSC after 10 min of ACLS   Past Surgical History:  Procedure Laterality Date  . AV  FISTULA PLACEMENT Left 05/14/2016   Procedure: LEFT ARM BASILIC VEIN TRANSPOSITION;  Surgeon: Rosetta Posner, MD;  Location: [facility];  Service: Vascular;  Laterality: Left;  . INSERTION OF DIALYSIS CATHETER N/A 06/20/2019   Procedure: INSERTION OF TUNNELED  DIALYSIS CATHETER;  Surgeon: Elam Dutch, MD;  Location: [facility];  Service: Vascular;  Laterality: N/A;  . LEFT HEART CATH AND CORONARY ANGIOGRAPHY N/A 09/02/2018   Procedure: LEFT HEART CATH AND CORONARY ANGIOGRAPHY;  Surgeon: Nelva Bush, MD;  Location: [facility] CV LAB;  Service: Cardiovascular;  Laterality: N/A;  . LEFT HEART CATH AND CORONARY ANGIOGRAPHY N/A 05/14/2020   Procedure: LEFT HEART CATH AND CORONARY ANGIOGRAPHY;  Surgeon: Sherren Mocha, MD;  Location: [facility] CV LAB;  Service: Cardiovascular;  Laterality: N/A;  . PERIPHERAL VASCULAR CATHETERIZATION N/A 05/13/2016   Procedure: Dialysis/Perma Catheter Insertion;  Surgeon: Serafina [place], MD;  Location: [facility] CV LAB;  Service: Cardiovascular;  Laterality: N/A;  . REVISION OF ARTERIOVENOUS GORETEX GRAFT Left 9/41/7408   Procedure: PLICATION OF ARTERIOVENOUS FISTULA LEFT ARM;  Surgeon: Elam Dutch, MD;  Location: [facility] OR;  Service: Vascular;  Laterality: Left;   Family History  Problem Relation Age of Onset  . Emphysema Mother   . Cirrhosis Father    Social History:  reports that he quit smoking about 2 years ago. His smoking use included cigars. He has a 96.00 pack-year smoking history. He has quit using smokeless tobacco.  His smokeless tobacco use included chew. He reports previous alcohol use.  He reports that he does not use drugs. No Known Allergies Prior to Admission medications   Medication Sig Start Date End Date Taking? Authorizing Provider  aspirin EC 81 MG EC tablet Take 1 tablet (81 mg total) by mouth daily. Swallow whole. 05/16/20  Yes Alma Friendly, MD  atorvastatin (LIPITOR) 80 MG tablet Take 1 tablet (80 mg total) by mouth daily. 03/16/20   Yes [name], [name], MD  clonazePAM (KLONOPIN) 0.5 MG tablet Take 0.5 mg by mouth daily.  02/11/18  Yes [provider]  clopidogrel (PLAVIX) 75 MG tablet Take 1 tablet by mouth once daily Patient taking differently: Take 75 mg by mouth daily.  05/17/20  Yes Meng, Isaac Laud, PA  diphenhydramine-acetaminophen (TYLENOL PM EXTRA STRENGTH) 25-500 MG TABS tablet Take 2 tablets by mouth at bedtime as needed (Pain, Sleep).  05/12/18  Yes [provider]  isosorbide mononitrate (IMDUR) 60 MG 24 hr tablet Take 1 tablet (60 mg total) by mouth daily. 06/28/20  Yes [name], [name], MD  metoprolol succinate (TOPROL-XL) 25 MG 24 hr tablet Take 1 tablet (25 mg total) by mouth 4 (four) times a week. Take on Sunday, Monday, Wednesday, Friday 06/26/20  Yes [name], [name], MD  nitroGLYCERIN (NITROSTAT) 0.4 MG SL tablet Place 1 tablet (0.4 mg total) under the tongue every 5 (five) minutes x 3 doses as needed for chest pain. 07/20/20  Yes [name], [name], MD   Current Facility-Administered Medications  Medication Dose Route Frequency Provider Last Rate Last Admin  . calcium gluconate inj 10% (1 g) URGENT USE ONLY!  1 g Intravenous Once Lajean Saver, MD      . Chlorhexidine Gluconate Cloth 2 % PADS 6 each  6 each Topical Q0600 Roney Jaffe, MD      . heparin injection 2,400 Units  2,400 Units Dialysis Once in dialysis Roney Jaffe, MD      . metoprolol succinate (TOPROL-XL) 24 hr tablet 25 mg  25 mg Oral Daily Mesner, Jason, MD   25 mg at 09/03/20 0149  . nitroGLYCERIN (NITROSTAT) SL tablet 0.4 mg  0.4 mg Sublingual Q5 min PRN Mesner, Corene Cornea, MD   0.4 mg at 09/03/20 0245   Current Outpatient Medications  Medication Sig Dispense Refill  . aspirin EC 81 MG EC tablet Take 1 tablet (81 mg total) by mouth daily. Swallow whole. 30 tablet 11  . atorvastatin (LIPITOR) 80 MG tablet Take 1 tablet (80 mg total) by mouth daily. 90 tablet 3  . clonazePAM (KLONOPIN) 0.5 MG tablet Take 0.5 mg by mouth daily.     .  clopidogrel (PLAVIX) 75 MG tablet Take 1 tablet by mouth once daily (Patient taking differently: Take 75 mg by mouth daily. ) 90 tablet 3  . diphenhydramine-acetaminophen (TYLENOL PM EXTRA STRENGTH) 25-500 MG TABS tablet Take 2 tablets by mouth at bedtime as needed (Pain, Sleep).     . isosorbide mononitrate (IMDUR) 60 MG 24 hr tablet Take 1 tablet (60 mg total) by mouth daily. 30 tablet 8  . metoprolol succinate (TOPROL-XL) 25 MG 24 hr tablet Take 1 tablet (25 mg total) by mouth 4 (four) times a week. Take on Sunday, Monday, Wednesday, Friday 30 tablet 8  . nitroGLYCERIN (NITROSTAT) 0.4 MG SL tablet Place 1 tablet (0.4 mg total) under the tongue every 5 (five) minutes x 3 doses as needed for chest pain. 30 tablet 0     ROS: As per HPI otherwise negative.  Physical Exam: Vitals:   09/03/20 0500 09/03/20  0530 09/03/20 0710 09/03/20 0800  BP: (!) 198/98 (!) 188/92    Pulse:  78 65   Resp: (!) 21 (!) 23 (!) 23   Temp:    97.8 F (36.6 C)  TempSrc:    Oral  SpO2:  95% 95%   Weight:      Height:         General: WNWD, on nasal oxygen, nad  Head: NCAT sclera not icteric  Neck: Supple. No JVD appreciated  Lungs: Normal WOB. Occasional rhonchi, upper airway wheeze Heart: RRR with S1 S2 Abdomen: soft NT + BS Lower extremities:without edema or ischemic changes, no open wounds  Neuro: A & O  X 3. Moves all extremities spontaneously. Psych:  Responds to questions appropriately with a normal affect. Dialysis Access: LUE AVF in use   Labs: Basic Metabolic Panel: Recent Labs  Lab 09/02/20 2248  NA 138  K 6.1*  CL 98  CO2 21*  GLUCOSE 86  BUN 105*  CREATININE 14.50*  CALCIUM 9.1   Liver Function Tests: Recent Labs  Lab 09/02/20 2248  AST 15  ALT 8  ALKPHOS 62  BILITOT 0.4  PROT 7.5  ALBUMIN 3.2*   No results for input(s): LIPASE, AMYLASE in the last 168 hours. No results for input(s): AMMONIA in the last 168 hours. CBC: Recent Labs  Lab 09/02/20 2248  WBC 10.4   NEUTROABS 6.8  HGB 11.5*  HCT 34.2*  MCV 95.5  PLT 295   Cardiac Enzymes: No results for input(s): CKTOTAL, CKMB, CKMBINDEX, TROPONINI in the last 168 hours. CBG: Recent Labs  Lab 09/03/20 0639 09/03/20 0648 09/03/20 0732  GLUCAP 68* 60* 74   Iron Studies: No results for input(s): IRON, TIBC, TRANSFERRIN, FERRITIN in the last 72 hours. Studies/Results: DG Chest Portable 1 View  Result Date: 09/02/2020 CLINICAL DATA:  Chest pain EXAM: PORTABLE CHEST 1 VIEW COMPARISON:  May 12, 2020 FINDINGS: There is cardiomegaly. Aortic calcifications are noted. There is no pneumothorax. No large pleural effusion. Chronic prominent interstitial lung markings are again noted. There is no definite acute osseous abnormality. IMPRESSION: No active disease. Electronically Signed   By: Constance Holster M.D.   On: 09/02/2020 23:08    Dialysis Orders:  [facility] TTS 4h 400/800 EDW 85.5kg 2K/2Ca AVF  Hep 2400 Mircera 75 q 2 weeks (last 10/28)  Sensipar 30 Calcitriol 2.0    Assessment/Plan: 1. Chest pain/Dyspnea - Troponin flat. Not felt ACS. Likely related to volume overload. Volume removal with HD today. Reassess after HD.  2. Hyperkalemia - 2/2 missed dialysis. Received Lokelma. Should correct with HD today.  3. ESRD -  HD TTS. Missed Saturday. HD off schedule today.  4. Hypertension/volume  - continue home meds. Should improve with UF  5.  Anemia  - Hgb 11.5. No ESA needs  6.  Metabolic bone disease -  Continue home binders, calcitriol if remains in hosptial  7. Hx CAD, prior NSTEMI 04/2020 - not amenable to [place] PA-C [facility] 09/03/2020, 8:22 AM

## 2020-09-03 NOTE — Telephone Encounter (Signed)
*  STAT* If patient is at the pharmacy, call can be transferred to refill team.   1. Which medications need to be refilled? (please list name of each medication and dose if known) nitroGLYCERIN (NITROSTAT) 0.4 MG SL tablet  2. Which pharmacy/location (including street and city if local pharmacy) is medication to be sent to? [place], [place]  3. Do they need a 30 day or 90 day supply? 30 day   Patient is out of medication

## 2020-09-03 NOTE — Procedures (Signed)
I was present at this dialysis session, have reviewed the session itself and made  appropriate changes.   UFG 4L, Qb 400 Tolerating well so far.  D/w RN - no issues to date.  Jannifer Hick MD [facility] pager [phone]   09/03/2020, 10:06 AM

## 2020-09-03 NOTE — ED Notes (Signed)
Pt reports chest pain is now 5/10, from 8/10, pt requesting another nitro

## 2020-09-03 NOTE — ED Notes (Addendum)
RN assumed care of pt, blood work collected, covid test completed, pt attached to monitor, pt denies any needs at this time, pt still makes urine, urinal provided, pt resting on stretcher , appears to be in NAD at this time, RN will continue to monitor, bed low and locked rails x2, call light in reach   Pt denies chest pain at this time

## 2020-09-03 NOTE — ED Provider Notes (Signed)
6:48 AM Assumed care from Dr. Alvino Chapel, please see their note for full history, physical and decision making until this point. In brief this is a 64 y.o. year old male who presented to the ED tonight with Chest Pain     Basically ruled out from an ACS standpoint as far as flat troponins with unchanged EKG however with his hypertension, hyperkalemia having missed dialysis for nearly a week he may need dialysis as a not convinced that he can get into his primary dialysis center so discussed with nephrology, Dr. Jonnie Finner, who recommended hold him in the ER and they would do dialysis this morning.  Lokelma given.    Labs, studies and imaging reviewed by myself and considered in medical decision making if ordered. Imaging interpreted by radiology.  Labs Reviewed  COMPREHENSIVE METABOLIC PANEL - Abnormal; Notable for the following components:      Result Value   Potassium 6.1 (*)    CO2 21 (*)    BUN 105 (*)    Creatinine, Ser 14.50 (*)    Albumin 3.2 (*)    GFR, Estimated 3 (*)    Anion gap 19 (*)    All other components within normal limits  CBC WITH DIFFERENTIAL/PLATELET - Abnormal; Notable for the following components:   RBC 3.58 (*)    Hemoglobin 11.5 (*)    HCT 34.2 (*)    RDW 17.2 (*)    Eosinophils Absolute 1.1 (*)    All other components within normal limits  CBG MONITORING, ED - Abnormal; Notable for the following components:   Glucose-Capillary 68 (*)    All other components within normal limits  TROPONIN I (HIGH SENSITIVITY) - Abnormal; Notable for the following components:   Troponin I (High Sensitivity) 31 (*)    All other components within normal limits  TROPONIN I (HIGH SENSITIVITY) - Abnormal; Notable for the following components:   Troponin I (High Sensitivity) 31 (*)    All other components within normal limits  RESPIRATORY PANEL BY RT PCR (FLU A&B, COVID)    DG Chest Portable 1 View  Final Result      No follow-ups on file.    Noeli Lavery, Corene Cornea, MD 09/03/20  [phone]

## 2020-09-03 NOTE — ED Notes (Signed)
Pt c/o chest pain 8/10 left side, MD informed , Pt requesting nitro, MD has placed order. Pt will be medicated per [facility]

## 2020-09-03 NOTE — ED Notes (Signed)
Pt hypertensive , MD Mesner secure chat messaged by this RN to be informed

## 2020-09-03 NOTE — Progress Notes (Signed)
CSW spoke with [facility] staff who states that [facility] does not do same day reservations for rides.  Patient will discharge home via cab.  Madilyn Fireman, MSW, LCSW-A Transitions of Care  Clinical Social Worker  [facility]  Medical ICU [phone]

## 2020-09-03 NOTE — ED Notes (Signed)
Pt d/c home per MD order. Discharge summary reviewed with pt, pt verbalizes understanding. Social work arranged transportation and provided  Clinical cytogeneticist for pt d/c ride home. Ambulatory off unit. No s/s of acute distress noted. Voicing no complaints at discharge.

## 2020-09-03 NOTE — ED Notes (Signed)
Pt transported to Dialysis floor.

## 2020-09-03 NOTE — ED Notes (Signed)
Got patient on the monitor patient is resting with call bell in reach  ?

## 2020-09-03 NOTE — ED Notes (Signed)
Pt still attempting to finish orange juice. Plan to measure CBG @ 0725

## 2020-09-04 DIAGNOSIS — Z23 Encounter for immunization: Secondary | ICD-10-CM | POA: Diagnosis not present

## 2020-09-04 DIAGNOSIS — N186 End stage renal disease: Secondary | ICD-10-CM | POA: Diagnosis not present

## 2020-09-04 DIAGNOSIS — N2581 Secondary hyperparathyroidism of renal origin: Secondary | ICD-10-CM | POA: Diagnosis not present

## 2020-09-04 DIAGNOSIS — D631 Anemia in chronic kidney disease: Secondary | ICD-10-CM | POA: Diagnosis not present

## 2020-09-04 DIAGNOSIS — Z992 Dependence on renal dialysis: Secondary | ICD-10-CM | POA: Diagnosis not present

## 2020-09-06 DIAGNOSIS — Z23 Encounter for immunization: Secondary | ICD-10-CM | POA: Diagnosis not present

## 2020-09-06 DIAGNOSIS — N186 End stage renal disease: Secondary | ICD-10-CM | POA: Diagnosis not present

## 2020-09-06 DIAGNOSIS — N2581 Secondary hyperparathyroidism of renal origin: Secondary | ICD-10-CM | POA: Diagnosis not present

## 2020-09-06 DIAGNOSIS — Z992 Dependence on renal dialysis: Secondary | ICD-10-CM | POA: Diagnosis not present

## 2020-09-06 DIAGNOSIS — D631 Anemia in chronic kidney disease: Secondary | ICD-10-CM | POA: Diagnosis not present

## 2020-09-08 DIAGNOSIS — D631 Anemia in chronic kidney disease: Secondary | ICD-10-CM | POA: Diagnosis not present

## 2020-09-08 DIAGNOSIS — Z23 Encounter for immunization: Secondary | ICD-10-CM | POA: Diagnosis not present

## 2020-09-08 DIAGNOSIS — Z992 Dependence on renal dialysis: Secondary | ICD-10-CM | POA: Diagnosis not present

## 2020-09-08 DIAGNOSIS — N186 End stage renal disease: Secondary | ICD-10-CM | POA: Diagnosis not present

## 2020-09-08 DIAGNOSIS — N2581 Secondary hyperparathyroidism of renal origin: Secondary | ICD-10-CM | POA: Diagnosis not present

## 2020-09-11 DIAGNOSIS — N186 End stage renal disease: Secondary | ICD-10-CM | POA: Diagnosis not present

## 2020-09-11 DIAGNOSIS — Z23 Encounter for immunization: Secondary | ICD-10-CM | POA: Diagnosis not present

## 2020-09-11 DIAGNOSIS — N2581 Secondary hyperparathyroidism of renal origin: Secondary | ICD-10-CM | POA: Diagnosis not present

## 2020-09-11 DIAGNOSIS — Z992 Dependence on renal dialysis: Secondary | ICD-10-CM | POA: Diagnosis not present

## 2020-09-11 DIAGNOSIS — D631 Anemia in chronic kidney disease: Secondary | ICD-10-CM | POA: Diagnosis not present

## 2020-09-13 DIAGNOSIS — Z992 Dependence on renal dialysis: Secondary | ICD-10-CM | POA: Diagnosis not present

## 2020-09-13 DIAGNOSIS — Z23 Encounter for immunization: Secondary | ICD-10-CM | POA: Diagnosis not present

## 2020-09-13 DIAGNOSIS — N186 End stage renal disease: Secondary | ICD-10-CM | POA: Diagnosis not present

## 2020-09-13 DIAGNOSIS — D631 Anemia in chronic kidney disease: Secondary | ICD-10-CM | POA: Diagnosis not present

## 2020-09-13 DIAGNOSIS — N2581 Secondary hyperparathyroidism of renal origin: Secondary | ICD-10-CM | POA: Diagnosis not present

## 2020-09-15 DIAGNOSIS — N186 End stage renal disease: Secondary | ICD-10-CM | POA: Diagnosis not present

## 2020-09-15 DIAGNOSIS — D631 Anemia in chronic kidney disease: Secondary | ICD-10-CM | POA: Diagnosis not present

## 2020-09-15 DIAGNOSIS — Z992 Dependence on renal dialysis: Secondary | ICD-10-CM | POA: Diagnosis not present

## 2020-09-15 DIAGNOSIS — N2581 Secondary hyperparathyroidism of renal origin: Secondary | ICD-10-CM | POA: Diagnosis not present

## 2020-09-15 DIAGNOSIS — Z23 Encounter for immunization: Secondary | ICD-10-CM | POA: Diagnosis not present

## 2020-09-17 DIAGNOSIS — N2581 Secondary hyperparathyroidism of renal origin: Secondary | ICD-10-CM | POA: Diagnosis not present

## 2020-09-17 DIAGNOSIS — D631 Anemia in chronic kidney disease: Secondary | ICD-10-CM | POA: Diagnosis not present

## 2020-09-17 DIAGNOSIS — Z992 Dependence on renal dialysis: Secondary | ICD-10-CM | POA: Diagnosis not present

## 2020-09-17 DIAGNOSIS — Z23 Encounter for immunization: Secondary | ICD-10-CM | POA: Diagnosis not present

## 2020-09-17 DIAGNOSIS — N186 End stage renal disease: Secondary | ICD-10-CM | POA: Diagnosis not present

## 2020-09-19 DIAGNOSIS — N2581 Secondary hyperparathyroidism of renal origin: Secondary | ICD-10-CM | POA: Diagnosis not present

## 2020-09-19 DIAGNOSIS — D631 Anemia in chronic kidney disease: Secondary | ICD-10-CM | POA: Diagnosis not present

## 2020-09-19 DIAGNOSIS — N186 End stage renal disease: Secondary | ICD-10-CM | POA: Diagnosis not present

## 2020-09-19 DIAGNOSIS — Z992 Dependence on renal dialysis: Secondary | ICD-10-CM | POA: Diagnosis not present

## 2020-09-19 DIAGNOSIS — Z23 Encounter for immunization: Secondary | ICD-10-CM | POA: Diagnosis not present

## 2020-09-21 ENCOUNTER — Other Ambulatory Visit: Payer: Self-pay

## 2020-09-21 ENCOUNTER — Emergency Department (HOSPITAL_COMMUNITY): Payer: Medicare Other

## 2020-09-21 ENCOUNTER — Encounter (HOSPITAL_COMMUNITY): Payer: Self-pay

## 2020-09-21 ENCOUNTER — Inpatient Hospital Stay (HOSPITAL_COMMUNITY)
Admission: EM | Admit: 2020-09-21 | Discharge: 2020-09-23 | DRG: 291 | Disposition: A | Discharge status: Home / Self Care | Payer: Medicare Other | Attending: Internal Medicine | Admitting: Internal Medicine

## 2020-09-21 DIAGNOSIS — Z7982 Long term (current) use of aspirin: Secondary | ICD-10-CM

## 2020-09-21 DIAGNOSIS — R778 Other specified abnormalities of plasma proteins: Secondary | ICD-10-CM

## 2020-09-21 DIAGNOSIS — I25119 Atherosclerotic heart disease of native coronary artery with unspecified angina pectoris: Secondary | ICD-10-CM | POA: Diagnosis present

## 2020-09-21 DIAGNOSIS — I10 Essential (primary) hypertension: Secondary | ICD-10-CM | POA: Diagnosis present

## 2020-09-21 DIAGNOSIS — N186 End stage renal disease: Secondary | ICD-10-CM | POA: Diagnosis not present

## 2020-09-21 DIAGNOSIS — I132 Hypertensive heart and chronic kidney disease with heart failure and with stage 5 chronic kidney disease, or end stage renal disease: Secondary | ICD-10-CM | POA: Diagnosis not present

## 2020-09-21 DIAGNOSIS — F1020 Alcohol dependence, uncomplicated: Secondary | ICD-10-CM | POA: Diagnosis present

## 2020-09-21 DIAGNOSIS — N2581 Secondary hyperparathyroidism of renal origin: Secondary | ICD-10-CM | POA: Diagnosis present

## 2020-09-21 DIAGNOSIS — Z825 Family history of asthma and other chronic lower respiratory diseases: Secondary | ICD-10-CM

## 2020-09-21 DIAGNOSIS — Z8674 Personal history of sudden cardiac arrest: Secondary | ICD-10-CM | POA: Diagnosis not present

## 2020-09-21 DIAGNOSIS — Z9119 Patient's noncompliance with other medical treatment and regimen: Secondary | ICD-10-CM | POA: Diagnosis not present

## 2020-09-21 DIAGNOSIS — Z79899 Other long term (current) drug therapy: Secondary | ICD-10-CM

## 2020-09-21 DIAGNOSIS — R079 Chest pain, unspecified: Secondary | ICD-10-CM | POA: Diagnosis present

## 2020-09-21 DIAGNOSIS — R0602 Shortness of breath: Secondary | ICD-10-CM | POA: Diagnosis not present

## 2020-09-21 DIAGNOSIS — I5033 Acute on chronic diastolic (congestive) heart failure: Secondary | ICD-10-CM | POA: Diagnosis present

## 2020-09-21 DIAGNOSIS — Z992 Dependence on renal dialysis: Secondary | ICD-10-CM

## 2020-09-21 DIAGNOSIS — R52 Pain, unspecified: Secondary | ICD-10-CM

## 2020-09-21 DIAGNOSIS — I160 Hypertensive urgency: Secondary | ICD-10-CM | POA: Diagnosis present

## 2020-09-21 DIAGNOSIS — R0789 Other chest pain: Secondary | ICD-10-CM | POA: Diagnosis not present

## 2020-09-21 DIAGNOSIS — I2510 Atherosclerotic heart disease of native coronary artery without angina pectoris: Secondary | ICD-10-CM | POA: Diagnosis present

## 2020-09-21 DIAGNOSIS — I4891 Unspecified atrial fibrillation: Secondary | ICD-10-CM | POA: Diagnosis present

## 2020-09-21 DIAGNOSIS — I5043 Acute on chronic combined systolic (congestive) and diastolic (congestive) heart failure: Secondary | ICD-10-CM | POA: Diagnosis not present

## 2020-09-21 DIAGNOSIS — I252 Old myocardial infarction: Secondary | ICD-10-CM | POA: Diagnosis not present

## 2020-09-21 DIAGNOSIS — I429 Cardiomyopathy, unspecified: Secondary | ICD-10-CM | POA: Diagnosis not present

## 2020-09-21 DIAGNOSIS — Z87891 Personal history of nicotine dependence: Secondary | ICD-10-CM

## 2020-09-21 DIAGNOSIS — D631 Anemia in chronic kidney disease: Secondary | ICD-10-CM | POA: Diagnosis present

## 2020-09-21 DIAGNOSIS — F0390 Unspecified dementia without behavioral disturbance: Secondary | ICD-10-CM | POA: Diagnosis present

## 2020-09-21 DIAGNOSIS — Z7189 Other specified counseling: Secondary | ICD-10-CM

## 2020-09-21 DIAGNOSIS — R110 Nausea: Secondary | ICD-10-CM | POA: Diagnosis not present

## 2020-09-21 DIAGNOSIS — Z7902 Long term (current) use of antithrombotics/antiplatelets: Secondary | ICD-10-CM

## 2020-09-21 DIAGNOSIS — J90 Pleural effusion, not elsewhere classified: Secondary | ICD-10-CM | POA: Diagnosis not present

## 2020-09-21 DIAGNOSIS — I517 Cardiomegaly: Secondary | ICD-10-CM | POA: Diagnosis not present

## 2020-09-21 DIAGNOSIS — E785 Hyperlipidemia, unspecified: Secondary | ICD-10-CM | POA: Diagnosis present

## 2020-09-21 DIAGNOSIS — Z20822 Contact with and (suspected) exposure to covid-19: Secondary | ICD-10-CM | POA: Diagnosis present

## 2020-09-21 DIAGNOSIS — N185 Chronic kidney disease, stage 5: Secondary | ICD-10-CM | POA: Diagnosis present

## 2020-09-21 LAB — LIPID PANEL
Cholesterol: 167 mg/dL (ref 0–200)
HDL: 44 mg/dL (ref >40)
LDL Cholesterol: 109 mg/dL — ABNORMAL HIGH (ref 0–99)
Total CHOL/HDL Ratio: 3.8 RATIO
Triglycerides: 69 mg/dL (ref <150)
VLDL: 14 mg/dL (ref 0–40)

## 2020-09-21 LAB — COMPREHENSIVE METABOLIC PANEL
ALT: 9 U/L (ref 0–44)
AST: 12 U/L — ABNORMAL LOW (ref 15–41)
Albumin: 3.2 g/dL — ABNORMAL LOW (ref 3.5–5.0)
Alkaline Phosphatase: 55 U/L (ref 38–126)
Anion gap: 17 — ABNORMAL HIGH (ref 5–15)
BUN: 63 mg/dL — ABNORMAL HIGH (ref 8–23)
CO2: 30 mmol/L (ref 22–32)
Calcium: 9.5 mg/dL (ref 8.9–10.3)
Chloride: 95 mmol/L — ABNORMAL LOW (ref 98–111)
Creatinine, Ser: 11 mg/dL — ABNORMAL HIGH (ref 0.61–1.24)
GFR, Estimated: 5 mL/min — ABNORMAL LOW (ref >60)
Glucose, Bld: 96 mg/dL (ref 70–99)
Potassium: 4.4 mmol/L (ref 3.5–5.1)
Sodium: 142 mmol/L (ref 135–145)
Total Bilirubin: 0.6 mg/dL (ref 0.3–1.2)
Total Protein: 7.4 g/dL (ref 6.5–8.1)

## 2020-09-21 LAB — TROPONIN I (HIGH SENSITIVITY)
Troponin I (High Sensitivity): 32 ng/L — ABNORMAL HIGH (ref <18)
Troponin I (High Sensitivity): 37 ng/L — ABNORMAL HIGH (ref <18)
Troponin I (High Sensitivity): 45 ng/L — ABNORMAL HIGH (ref <18)

## 2020-09-21 LAB — CBC
HCT: 33.9 % — ABNORMAL LOW (ref 39.0–52.0)
Hemoglobin: 11.3 g/dL — ABNORMAL LOW (ref 13.0–17.0)
MCH: 32 pg (ref 26.0–34.0)
MCHC: 33.3 g/dL (ref 30.0–36.0)
MCV: 96 fL (ref 80.0–100.0)
Platelets: 204 10*3/uL (ref 150–400)
RBC: 3.53 MIL/uL — ABNORMAL LOW (ref 4.22–5.81)
RDW: 17.2 % — ABNORMAL HIGH (ref 11.5–15.5)
WBC: 6.6 10*3/uL (ref 4.0–10.5)
nRBC: 0 % (ref 0.0–0.2)

## 2020-09-21 LAB — HIV ANTIBODY (ROUTINE TESTING W REFLEX): HIV Screen 4th Generation wRfx: NONREACTIVE

## 2020-09-21 LAB — RESP PANEL BY RT-PCR (FLU A&B, COVID) ARPGX2
Influenza A by PCR: NEGATIVE
Influenza B by PCR: NEGATIVE
SARS Coronavirus 2 by RT PCR: NEGATIVE

## 2020-09-21 MED ORDER — NITROGLYCERIN 2 % TD OINT
1.0000 [in_us] | TOPICAL_OINTMENT | Freq: Once | TRANSDERMAL | Status: AC
  Administered 2020-09-21: 1 [in_us] via TOPICAL
  Filled 2020-09-21: qty 1

## 2020-09-21 MED ORDER — MORPHINE SULFATE (PF) 2 MG/ML IV SOLN
2.0000 mg | Freq: Once | INTRAVENOUS | Status: DC
  Filled 2020-09-21: qty 1

## 2020-09-21 MED ORDER — AMLODIPINE BESYLATE 5 MG PO TABS
5.0000 mg | ORAL_TABLET | Freq: Every day | ORAL | Status: DC
  Administered 2020-09-22 (×2): 5 mg via ORAL
  Filled 2020-09-21 (×2): qty 1

## 2020-09-21 MED ORDER — ACETAMINOPHEN 325 MG PO TABS: 650.0000 mg | ORAL_TABLET | ORAL | Status: DC | PRN

## 2020-09-21 MED ORDER — LABETALOL HCL 5 MG/ML IV SOLN
5.0000 mg | Freq: Once | INTRAVENOUS | Status: AC
  Administered 2020-09-21: 5 mg via INTRAVENOUS
  Filled 2020-09-21: qty 4

## 2020-09-21 MED ORDER — ONDANSETRON HCL 4 MG/2ML IJ SOLN: 4.0000 mg | Freq: Four times a day (QID) | INTRAMUSCULAR | Status: DC | PRN

## 2020-09-21 MED ORDER — HEPARIN SODIUM (PORCINE) 5000 UNIT/ML IJ SOLN
5000.0000 [IU] | Freq: Three times a day (TID) | INTRAMUSCULAR | Status: DC
  Administered 2020-09-22 – 2020-09-23 (×4): 5000 [IU] via SUBCUTANEOUS
  Filled 2020-09-21 (×4): qty 1

## 2020-09-21 NOTE — H&P (Signed)
History and Physical   Kenneth [name] AST:[id number] DOB: 29-Aug-1956 DOA: 09/21/2020  Referring MD/NP/PA: Aaron Edelman melena MD  PCP: [name], [name], MD   Outpatient Specialists: [facility]  Patient coming from: Home  Chief Complaint: Chest pain  HPI: Kenneth [name] is a 64 y.o. male with medical history significant of end-stage renal disease on hemodialysis Mondays Wednesdays and Fridays who has not been dialyzed today due to holidays but will be dialyzed tomorrow, systolic dysfunction CHF with the last EF of 30 to 35%, history of alcoholic heart disease, coronary artery disease, hypertension, history of cardiac arrest and May 2019 who came to the ER complaining of substernal chest pain radiating to his left arm and jaw.  Pain was rated as 8 out of 10 initially.  Currently relieved by nitro glycerin x2.  He was seen in the ER with mildly elevated troponin.  EKG was unchanged.  Patient however has significant cardiac history with chest pain that is consistent with angina.  He is being admitted for observation and MI ruled out..  ED Course: Temperature 97.8 blood pressure 197/88 pulse 77 respiratory rate of 22 and oxygen sats 92% on room air.  White count 6.6 hemoglobin 11.3 and platelets 204.  Sodium 142 potassium 4.4 chloride 95 CO2 30 BUN is 63 creatinine 11 calcium 9.5.  Troponins is 32 then 37 than 45.  COVID-19 screen is negative.  Chest x-ray shows mild pulmonary edema and no infiltrates.  Patient is being admitted for cardiac rule out.  Review of Systems: As per HPI otherwise 10 point review of systems negative.    Past Medical History:  Diagnosis Date  . Acute CHF ([place]) 01/2018  . Cardiomyopathy secondary    likely related to HTN heart disease; possibly ETOH related as well  . Chronic combined systolic and diastolic heart failure (HCC)    Echocardiogram 09/22/11: Moderate LVH, EF 98-92%, grade 3 diastolic dysfunction, mild MR, moderate to severe LAE, mild RVE, mild to  moderate TR, small to moderate pericardial effusion  . Coronary artery disease    not felt to be candidate for CABG 08/2018; medical thearpy recomended due to high risk of PCI   . Dysrhythmia    aflutter 04/2016, afib 02/2018, not felt to be a candidate for anticoagulation due to non-compliance and ETOH  . ESRD (end stage renal disease) on dialysis ([place])    due to hypertensive nephrosclerosis; TTS; [address] (09/01/2018)  . History of alcohol abuse   . Hypertension   . Myocardial infarction [facility])    " mild " per daughter  . PEA (Pulseless electrical activity) ([place])    PEA arrest 03/09/18, treated empirically for hyperkalemia, shock x1 for WCT, given amiodarone, ROSC after 10 min of ACLS    Past Surgical History:  Procedure Laterality Date  . AV FISTULA PLACEMENT Left 05/14/2016   Procedure: LEFT ARM BASILIC VEIN TRANSPOSITION;  Surgeon: Rosetta Posner, MD;  Location: [place];  Service: Vascular;  Laterality: Left;  . INSERTION OF DIALYSIS CATHETER N/A 06/20/2019   Procedure: INSERTION OF TUNNELED  DIALYSIS CATHETER;  Surgeon: Elam Dutch, MD;  Location: [place];  Service: Vascular;  Laterality: N/A;  . LEFT HEART CATH AND CORONARY ANGIOGRAPHY N/A 09/02/2018   Procedure: LEFT HEART CATH AND CORONARY ANGIOGRAPHY;  Surgeon: Nelva Bush, MD;  Location: [place] CV LAB;  Service: Cardiovascular;  Laterality: N/A;  . LEFT HEART CATH AND CORONARY ANGIOGRAPHY N/A 05/14/2020   Procedure: LEFT HEART CATH AND CORONARY ANGIOGRAPHY;  Surgeon: Sherren Mocha, MD;  Location: [place] CV [facility];  Service: Cardiovascular;  Laterality: N/A;  . PERIPHERAL VASCULAR CATHETERIZATION N/A 05/13/2016   Procedure: Dialysis/Perma Catheter Insertion;  Surgeon: Serafina Mitchell, MD;  Location: [place] CV LAB;  Service: Cardiovascular;  Laterality: N/A;  . REVISION OF ARTERIOVENOUS GORETEX GRAFT Left 2/68/3419   Procedure: PLICATION OF ARTERIOVENOUS FISTULA LEFT ARM;  Surgeon: Elam Dutch, MD;  Location: [facility]  OR;  Service: Vascular;  Laterality: Left;     reports that he quit smoking about 2 years ago. His smoking use included cigars. He has a 96.00 pack-year smoking history. He has quit using smokeless tobacco.  His smokeless tobacco use included chew. He reports current alcohol use. He reports that he does not use drugs.  No Known Allergies  Family History  Problem Relation Age of Onset  . Emphysema Mother   . Cirrhosis Father      Prior to Admission medications   Medication Sig Start Date End Date Taking? Authorizing Provider  aspirin EC 81 MG EC tablet Take 1 tablet (81 mg total) by mouth daily. Swallow whole. 05/16/20  Yes Alma Friendly, MD  atorvastatin (LIPITOR) 80 MG tablet Take 1 tablet (80 mg total) by mouth daily. 03/16/20  Yes [name], [name], MD  clonazePAM (KLONOPIN) 0.5 MG tablet Take 0.5 mg by mouth daily.  02/11/18  Yes [provider]  clopidogrel (PLAVIX) 75 MG tablet Take 1 tablet by mouth once daily Patient taking differently: Take 75 mg by mouth daily.  05/17/20  Yes Meng, Isaac Laud, PA  diphenhydramine-acetaminophen (TYLENOL PM EXTRA STRENGTH) 25-500 MG TABS tablet Take 2 tablets by mouth at bedtime as needed (Pain, Sleep).  05/12/18  Yes [provider]  isosorbide mononitrate (IMDUR) 60 MG 24 hr tablet Take 1 tablet (60 mg total) by mouth daily. 06/28/20  Yes [name], [name], MD  metoprolol succinate (TOPROL-XL) 25 MG 24 hr tablet Take 1 tablet (25 mg total) by mouth 4 (four) times a week. Take on Sunday, Monday, Wednesday, Friday 06/26/20  Yes [name], [name], MD  nitroGLYCERIN (NITROSTAT) 0.4 MG SL tablet DISSOLVE ONE TABLET UNDER THE TONGUE EVERY 5 MINUTES AS NEEDED FOR CHEST PAIN.  DO NOT EXCEED A TOTAL OF 3 DOSES IN 15 MINUTES Patient taking differently: Place 0.4 mg under the tongue every 5 (five) minutes as needed for chest pain.  09/04/20  Yes [name], [name], MD    Physical Exam: Vitals:   09/21/20 1915 09/21/20 1917 09/21/20 1945 09/21/20 2005    BP: (!) 180/84 (!) 180/84 (!) 182/90 (!) 182/80  Pulse: 76   74  Resp: 13  13 19   Temp:    97.6 F (36.4 C)  TempSrc:    Oral  SpO2: 93%   94%  Weight:      Height:          Constitutional: Acutely ill looking no distress Vitals:   09/21/20 1915 09/21/20 1917 09/21/20 1945 09/21/20 2005  BP: (!) 180/84 (!) 180/84 (!) 182/90 (!) 182/80  Pulse: 76   74  Resp: 13  13 19   Temp:    97.6 F (36.4 C)  TempSrc:    Oral  SpO2: 93%   94%  Weight:      Height:       Eyes: PERRL, lids and conjunctivae normal ENMT: Mucous membranes are moist. Posterior pharynx clear of any exudate or lesions.Normal dentition.  Neck: normal, supple, no masses, no thyromegaly Respiratory: clear to  auscultation bilaterally, no wheezing, no crackles. Normal respiratory effort. No accessory muscle use.  Cardiovascular: Regular rate and rhythm, no murmurs / rubs / gallops. No extremity edema. 2+ pedal pulses. No carotid bruits.  Abdomen: no tenderness, no masses palpated. No hepatosplenomegaly. Bowel sounds positive.  Musculoskeletal: no clubbing / cyanosis. No joint deformity upper and lower extremities. Good ROM, no contractures. Normal muscle tone.  Skin: no rashes, lesions, ulcers. No induration Neurologic: CN 2-12 grossly intact. Sensation intact, DTR normal. Strength 5/5 in all 4.  Psychiatric: Normal judgment and insight. Alert and oriented x 3. Normal mood.     Labs on Admission: I have personally reviewed following labs and imaging studies  CBC: Recent Labs  Lab 09/21/20 1708  WBC 6.6  HGB 11.3*  HCT 33.9*  MCV 96.0  PLT 144   Basic Metabolic Panel: Recent Labs  Lab 09/21/20 1708  NA 142  K 4.4  CL 95*  CO2 30  GLUCOSE 96  BUN 63*  CREATININE 11.00*  CALCIUM 9.5   GFR: Estimated Creatinine Clearance: 7.6 mL/min (A) (by C-G formula based on SCr of 11 mg/dL (H)). Liver Function Tests: Recent Labs  Lab 09/21/20 1708  AST 12*  ALT 9  ALKPHOS 55  BILITOT 0.6  PROT 7.4   ALBUMIN 3.2*   No results for input(s): LIPASE, AMYLASE in the last 168 hours. No results for input(s): AMMONIA in the last 168 hours. Coagulation Profile: No results for input(s): INR, PROTIME in the last 168 hours. Cardiac Enzymes: No results for input(s): CKTOTAL, CKMB, CKMBINDEX, TROPONINI in the last 168 hours. BNP (last 3 results) No results for input(s): PROBNP in the last 8760 hours. HbA1C: No results for input(s): HGBA1C in the last 72 hours. CBG: No results for input(s): GLUCAP in the last 168 hours. Lipid Profile: No results for input(s): CHOL, HDL, LDLCALC, TRIG, CHOLHDL, LDLDIRECT in the last 72 hours. Thyroid Function Tests: No results for input(s): TSH, T4TOTAL, FREET4, T3FREE, THYROIDAB in the last 72 hours. Anemia Panel: No results for input(s): VITAMINB12, FOLATE, FERRITIN, TIBC, IRON, RETICCTPCT in the last 72 hours. Urine analysis:    Component Value Date/Time   COLORURINE YELLOW 05/12/2016 0834   APPEARANCEUR CLOUDY (A) 05/12/2016 0834   LABSPEC 1.014 05/12/2016 0834   PHURINE 5.0 05/12/2016 0834   GLUCOSEU NEGATIVE 05/12/2016 0834   HGBUR MODERATE (A) 05/12/2016 0834   BILIRUBINUR NEGATIVE 05/12/2016 0834   KETONESUR NEGATIVE 05/12/2016 0834   PROTEINUR 100 (A) 05/12/2016 0834   UROBILINOGEN 0.2 12/09/2013 0013   NITRITE NEGATIVE 05/12/2016 0834   LEUKOCYTESUR NEGATIVE 05/12/2016 0834   Sepsis Labs: @LABRCNTIP (procalcitonin:4,lacticidven:4) )No results found for this or any previous visit (from the past 240 hour(s)).   Radiological Exams on Admission: DG Chest Port 1 View  Result Date: 09/21/2020 CLINICAL DATA:  Chest pain short of breath EXAM: PORTABLE CHEST 1 VIEW COMPARISON:  09/02/2020 FINDINGS: Mildly diminished lung volumes. Mild cardiomegaly. Negative for edema, focal opacity or pleural effusion. No pneumothorax. Aortic atherosclerosis IMPRESSION: Mild cardiomegaly. No edema or infiltrate. Electronically Signed   By: Donavan Foil M.D.   On:  09/21/2020 17:11    EKG: Independently reviewed.  Shows normal sinus rhythm with a rate of 65.  Evidence of LVH by voltage criteria.  Mildly inverted T waves in V5 and V6 no significant ST changes  Assessment/Plan Active Problems:   Essential hypertension   Alcoholism (HCC)   CKD (chronic kidney disease) stage 5, GFR less than 15 ml/min (HCC)   ESRD on  hemodialysis ([place])   Acute on chronic diastolic CHF (congestive heart failure) ([place])   Coronary artery disease involving native heart with angina pectoris ([place])   DNR (do not resuscitate) discussion   Chest pain     #1 substernal chest pain: Patient will be admitted for rule out MI.  He has improved with his chest pain.  I suspect missing his hemodialysis today may have something to do with it.  No cough no fever.  The enzymes elevation is most significant in the setting of end-stage renal disease.  If we see him more rapid rise then we will get echo and continue with other treatments.  #2 end-stage renal disease: We will consult nephrology for dialysis tomorrow.  #3 essential hypertension: We will continue home regimen  #4 systolic dysfunction CHF: EF of 30 to 35%.  Continue home regimen  #5 history of alcoholism: Patient has apparently quit drinking.  Continue to monitor   DVT prophylaxis: Heparin Code Status: Full code Family Communication: No family at bedside Disposition Plan: Home Consults called: None Admission status: Observation  Severity of Illness: The appropriate patient status for this patient is OBSERVATION. Observation status is judged to be reasonable and necessary in order to provide the required intensity of service to ensure the patient's safety. The patient's presenting symptoms, physical exam findings, and initial radiographic and laboratory data in the context of their medical condition is felt to place them at decreased risk for further clinical deterioration. Furthermore, it is anticipated that the patient  will be medically stable for discharge from the hospital within 2 midnights of admission. The following factors support the patient status of observation.   " The patient's presenting symptoms include chest pain. " The physical exam findings include no significant findings. " The initial radiographic and laboratory data are mildly elevated troponins.     Barbette Merino MD [facility] Pager [phone]  If 7PM-7AM, please contact night-coverage [email or web address] Password [facility]  09/21/2020, 8:14 PM

## 2020-09-21 NOTE — ED Provider Notes (Signed)
[facility]ERGENCY DEPARTMENT Provider Note   CSN: [id number] Arrival date & time: 09/21/20  1633     History Chief Complaint  Patient presents with  . Chest Pain    Kenneth [name] is a 64 y.o. male with a history of acute CHF, end-stage renal disease on dialysis (M, W, SAT), CAD, hypertension, and cardiomyopathy.  Patient was brought to the emergency department via EMS.  Patient's chief complaint is left-sided chest pain that began suddenly at 10:00 this morning after eating a meal.  Patient described the pain as sharp, rated it as a 8/10 denied any radiation and stated it felt better when laying down.  Patient's pain remained constant and unchanged throughout the day.  Patient received nitroglycerin and 324 aspirin prior to arrival in the emergency department.  Patient states that his pain  is now resolved.  Patient also endorsed nausea and 2 episodes of vomiting throughout the day.  Patient denied any shortness of breath, leg swelling, palpitations, or diaphoresis.   Patient endorses former smoking history with quit date 2 years prior, pack-year history of 8.  Patient endorses drinking 2 L of liqour on a weekly basis.  Patient reports last dialysis appointment was on Wednesday.  Per chart review patient had a left heart catheterization 04/2020 which showed severe multivessel CAD; however patient is not a candidate for stenting and is managed medically.  Patient denies missing any of his medications recently.      Past Medical History:  Diagnosis Date  . Acute CHF ([place]) 01/2018  . Cardiomyopathy secondary    likely related to HTN heart disease; possibly ETOH related as well  . Chronic combined systolic and diastolic heart failure (HCC)    Echocardiogram 09/22/11: Moderate LVH, EF 82-99%, grade 3 diastolic dysfunction, mild MR, moderate to severe LAE, mild RVE, mild to moderate TR, small to moderate pericardial effusion  . Coronary artery disease    not felt to be  candidate for CABG 08/2018; medical thearpy recomended due to high risk of PCI   . Dysrhythmia    aflutter 04/2016, afib 02/2018, not felt to be a candidate for anticoagulation due to non-compliance and ETOH  . ESRD (end stage renal disease) on dialysis ([place])    due to hypertensive nephrosclerosis; TTS; [address] (09/01/2018)  . History of alcohol abuse   . Hypertension   . Myocardial infarction [facility])    " mild " per daughter  . PEA (Pulseless electrical activity) ([place])    PEA arrest 03/09/18, treated empirically for hyperkalemia, shock x1 for WCT, given amiodarone, ROSC after 10 min of ACLS    Patient Active Problem List   Diagnosis Date Noted  . Chest pain 09/21/2020  . NSTEMI (non-ST elevated myocardial infarction) ([place]) 05/12/2020  . Non-ST elevated myocardial infarction ([place]) 05/12/2020  . DNR (do not resuscitate) discussion   . Palliative care by specialist   . Goals of care, counseling/discussion   . Coronary artery disease involving native heart with angina pectoris ([place])   . STEMI (ST elevation myocardial infarction) ([place]) 09/17/2018  . Unstable angina ([place])   . ACS (acute coronary syndrome) ([place]) 09/01/2018  . ESRD (end stage renal disease) ([place]) 07/20/2018  . Acute respiratory failure ([place]) 05/11/2018  . Cardiac arrest ([place]) 03/09/2018  . Acute encephalopathy   . Acute on chronic respiratory failure ([place]) 02/16/2018  . Tobacco dependence 02/16/2018  . Pulmonary edema 01/26/2018  . Acute respiratory failure with hypoxia ([place]) 01/26/2018  . Atrial fibrillation and  flutter ([place])   . Shortness of breath   . Acute on chronic diastolic CHF (congestive heart failure) ([place])   . Hypothermia 10/08/2016  . ESRD on hemodialysis ([place]) 10/08/2016  . Fall 10/08/2016  . Syncope 10/07/2016  . Near syncope 07/07/2016  . Cardiomyopathy ([place]) 07/07/2016  . Ventricular tachycardia, non-sustained ([place]) 07/06/2016  . Hypoglycemia 07/06/2016  . CKD (chronic kidney disease) stage 5, GFR  less than 15 ml/min (HCC)   . Alcoholism ([place]) 05/13/2016  . Hypertensive emergency 12/08/2013  . Cardiomyopathy secondary 10/09/2011  . Essential hypertension 10/09/2011  . Alcohol withdrawal delirium ([place]) 09/25/2011  . Chronic diastolic CHF (congestive heart failure) ([place]) 09/24/2011    Past Surgical History:  Procedure Laterality Date  . AV FISTULA PLACEMENT Left 05/14/2016   Procedure: LEFT ARM BASILIC VEIN TRANSPOSITION;  Surgeon: Rosetta Posner, MD;  Location: [facility];  Service: Vascular;  Laterality: Left;  . INSERTION OF DIALYSIS CATHETER N/A 06/20/2019   Procedure: INSERTION OF TUNNELED  DIALYSIS CATHETER;  Surgeon: Elam Dutch, MD;  Location: [facility];  Service: Vascular;  Laterality: N/A;  . LEFT HEART CATH AND CORONARY ANGIOGRAPHY N/A 09/02/2018   Procedure: LEFT HEART CATH AND CORONARY ANGIOGRAPHY;  Surgeon: Nelva Bush, MD;  Location: [facility] CV LAB;  Service: Cardiovascular;  Laterality: N/A;  . LEFT HEART CATH AND CORONARY ANGIOGRAPHY N/A 05/14/2020   Procedure: LEFT HEART CATH AND CORONARY ANGIOGRAPHY;  Surgeon: Sherren Mocha, MD;  Location: [facility] CV LAB;  Service: Cardiovascular;  Laterality: N/A;  . PERIPHERAL VASCULAR CATHETERIZATION N/A 05/13/2016   Procedure: Dialysis/Perma Catheter Insertion;  Surgeon: Serafina Mitchell, MD;  Location: [facility] CV LAB;  Service: Cardiovascular;  Laterality: N/A;  . REVISION OF ARTERIOVENOUS GORETEX GRAFT Left 06/24/5620   Procedure: PLICATION OF ARTERIOVENOUS FISTULA LEFT ARM;  Surgeon: Elam Dutch, MD;  Location: [facility] OR;  Service: Vascular;  Laterality: Left;       Family History  Problem Relation Age of Onset  . Emphysema Mother   . Cirrhosis Father     Social History   Tobacco Use  . Smoking status: Former Smoker    Packs/day: 2.00    Years: 48.00    Pack years: 96.00    Types: Cigars    Quit date: 10/27/2017    Years since quitting: 2.9  . Smokeless tobacco: Former Systems developer    Types: Secondary school teacher    . Vaping Use: Never used  Substance Use Topics  . Alcohol use: Yes    Comment: 1 L liquor  . Drug use: No    Home Medications Prior to Admission medications   Medication Sig Start Date End Date Taking? Authorizing Provider  aspirin EC 81 MG EC tablet Take 1 tablet (81 mg total) by mouth daily. Swallow whole. 05/16/20  Yes Alma Friendly, MD  atorvastatin (LIPITOR) 80 MG tablet Take 1 tablet (80 mg total) by mouth daily. 03/16/20  Yes [name], Dashan Chizmar M, MD  clonazePAM (KLONOPIN) 0.5 MG tablet Take 0.5 mg by mouth daily.  02/11/18  Yes [provider]  clopidogrel (PLAVIX) 75 MG tablet Take 1 tablet by mouth once daily Patient taking differently: Take 75 mg by mouth daily.  05/17/20  Yes Meng, Isaac Laud, PA  diphenhydramine-acetaminophen (TYLENOL PM EXTRA STRENGTH) 25-500 MG TABS tablet Take 2 tablets by mouth at bedtime as needed (Pain, Sleep).  05/12/18  Yes [provider]  isosorbide mononitrate (IMDUR) 60 MG 24 hr tablet Take 1 tablet (60 mg total) by  mouth daily. 06/28/20  Yes [name], Orie Cuttino M, MD  metoprolol succinate (TOPROL-XL) 25 MG 24 hr tablet Take 1 tablet (25 mg total) by mouth 4 (four) times a week. Take on Sunday, Monday, Wednesday, Friday 06/26/20  Yes [name], Jesyka Slaght M, MD  nitroGLYCERIN (NITROSTAT) 0.4 MG SL tablet DISSOLVE ONE TABLET UNDER THE TONGUE EVERY 5 MINUTES AS NEEDED FOR CHEST PAIN.  DO NOT EXCEED A TOTAL OF 3 DOSES IN 15 MINUTES Patient taking differently: Place 0.4 mg under the tongue every 5 (five) minutes as needed for chest pain.  09/04/20  Yes [name], Mory Herrman M, MD    Allergies    Patient has no known allergies.  Review of Systems   Review of Systems  Constitutional: Positive for fever. Negative for chills, diaphoresis and unexpected weight change.  HENT: Negative.   Respiratory: Negative for cough and shortness of breath.   Cardiovascular: Positive for chest pain. Negative for palpitations and leg swelling.  Gastrointestinal: Positive for nausea  and vomiting. Negative for abdominal distention and abdominal pain.  Endocrine: Positive for polydipsia. Negative for polyphagia and polyuria.  Genitourinary: Negative for difficulty urinating.  Musculoskeletal: Negative.   Skin: Negative for color change, pallor and rash.  Neurological: Negative for dizziness, seizures, syncope, speech difficulty, weakness, light-headedness, numbness and headaches.  Psychiatric/Behavioral: Negative for confusion.    Physical Exam Updated Vital Signs BP (!) 167/93 (BP Location: Right Arm)   Pulse 69   Temp 98.8 F (37.1 C) (Oral)   Resp 17   Ht 6' (1.829 m)   Wt 88.7 kg   SpO2 96%   BMI 26.53 kg/m   Physical Exam Constitutional:      General: He is not in acute distress.    Appearance: He is obese. He is not ill-appearing, toxic-appearing or diaphoretic.  HENT:     Head: Normocephalic and atraumatic.     Mouth/Throat:     Mouth: Mucous membranes are moist.     Comments: Poor dentition Neck:     Vascular: JVD present.  Cardiovascular:     Rate and Rhythm: Normal rate and regular rhythm.     Heart sounds: Normal heart sounds.  Pulmonary:     Effort: Pulmonary effort is normal. No respiratory distress.     Breath sounds: Normal breath sounds.  Chest:     Chest wall: No mass, deformity or tenderness.  Abdominal:     General: There is no distension.     Palpations: Abdomen is soft. There is no mass.     Tenderness: There is no abdominal tenderness.  Musculoskeletal:     Cervical back: Neck supple.     Comments: Fistula located in upper right arm, good thrill palpated, no erythema  Skin:    General: Skin is warm and dry.  Neurological:     General: No focal deficit present.     Mental Status: He is alert.     ED Results / Procedures / Treatments   Labs (all labs ordered are listed, but only abnormal results are displayed) Labs Reviewed  COMPREHENSIVE METABOLIC PANEL - Abnormal; Notable for the following components:      Result  Value   Chloride 95 (*)    BUN 63 (*)    Creatinine, Ser 11.00 (*)    Albumin 3.2 (*)    AST 12 (*)    GFR, Estimated 5 (*)    Anion gap 17 (*)    All other components within normal limits  CBC - Abnormal; Notable for  the following components:   RBC 3.53 (*)    Hemoglobin 11.3 (*)    HCT 33.9 (*)    RDW 17.2 (*)    All other components within normal limits  LIPID PANEL - Abnormal; Notable for the following components:   LDL Cholesterol 109 (*)    All other components within normal limits  TROPONIN I (HIGH SENSITIVITY) - Abnormal; Notable for the following components:   Troponin I (High Sensitivity) 32 (*)    All other components within normal limits  TROPONIN I (HIGH SENSITIVITY) - Abnormal; Notable for the following components:   Troponin I (High Sensitivity) 37 (*)    All other components within normal limits  TROPONIN I (HIGH SENSITIVITY) - Abnormal; Notable for the following components:   Troponin I (High Sensitivity) 45 (*)    All other components within normal limits  RESP PANEL BY RT-PCR (FLU A&B, COVID) ARPGX2  MRSA PCR SCREENING  HIV ANTIBODY (ROUTINE TESTING W REFLEX)  RAPID URINE DRUG SCREEN, HOSP PERFORMED  TROPONIN I (HIGH SENSITIVITY)    EKG EKG Interpretation  Date/Time:  Friday September 21 2020 16:57:01 EST Ventricular Rate:  65 PR Interval:    QRS Duration: 103 QT Interval:  417 QTC Calculation: 434 R Axis:   6 Text Interpretation: Sinus rhythm Atrial premature complex Probable left ventricular hypertrophy Borderline T abnormalities, inferior leads Since last tracing rate slower otherwise unchanged Confirmed by Noemi Chapel [phone]) on 09/21/2020 5:12:47 PM   Radiology DG Chest Port 1 View  Result Date: 09/21/2020 CLINICAL DATA:  Chest pain short of breath EXAM: PORTABLE CHEST 1 VIEW COMPARISON:  09/02/2020 FINDINGS: Mildly diminished lung volumes. Mild cardiomegaly. Negative for edema, focal opacity or pleural effusion. No pneumothorax. Aortic  atherosclerosis IMPRESSION: Mild cardiomegaly. No edema or infiltrate. Electronically Signed   By: Donavan Foil M.D.   On: 09/21/2020 17:11    Procedures Procedures (including critical care time)  Medications Ordered in ED Medications  acetaminophen (TYLENOL) tablet 650 mg (has no administration in time range)  ondansetron (ZOFRAN) injection 4 mg (has no administration in time range)  heparin injection 5,000 Units (has no administration in time range)  morphine 2 MG/ML injection 2 mg (2 mg Intravenous Not Given 09/21/20 2122)  nitroGLYCERIN (NITROGLYN) 2 % ointment 1 inch (1 inch Topical Given 09/21/20 1852)  labetalol (NORMODYNE) injection 5 mg (5 mg Intravenous Given 09/21/20 1914)    ED Course  I have reviewed the triage vital signs and the nursing notes.  Pertinent labs & imaging results that were available during my care of the patient were reviewed by me and considered in my medical decision making (see chart for details).    MDM Rules/Calculators/A&P                          Alert well-appearing 64 year old male with a history of end-stage renal disease on dialysis, CAD(being treated medically, s/p LHC 04/2020), HTN, CHF, and cardiomyopathy.  Patient reports sharp left-sided chest pain that began at 10:00 this morning, 8/10, was constant throughout the day, no radiation improved with laying down.  Associated with nausea and 2 episodes of vomiting.  Patient received nitro and 324mg  aspirin prior to arrival and at present reports no pain.    EKG showed sinus rhythm with PAC,  T abnormalities, in inferior leads since last tracing rate slower otherwise unchanged.   CXR showed mild cardiomegaly no signs of pneumonia, pneumothorax, widened mediastinum. Troponin, CBC, CMP pending.  Patient  was given Nitropaste.  We will continue to monitor patient and trend troponin.  CBC shows decreased RBC, hematocrit and hemoglobin; values are stable when compared to results acquired 2 weeks  earlier.  Repeat troponin was found to be 37, with a delta of +5.  Patient complained of left-sided chest pain after drinking water.  Patient was given morphine to help with chest pain. Based on his extensive cardiac history and his concerning story for chest pain today he was admitted for further observation.  Patient was amenable to this plan.       Final Clinical Impression(s) / ED Diagnoses Final diagnoses:  Chest pain, unspecified type  Elevated troponin    Rx / DC Orders ED Discharge Orders    None       Dyann Ruddle 09/21/20 2323    Noemi Chapel, MD 09/22/20 1515

## 2020-09-21 NOTE — ED Provider Notes (Signed)
65 y/o ESRD, on dialysis - M,W,S, appt on Wednesday was last dialysis - he has CAD, LHC done 7/21, severe CAD and CHF - medical therapy only, Htn, comes in with CC of L sided CP - started after eating this morning at 10 AM.  8/10, sharp without radiation, better with laying down, n/v X 2 today - no blood.  No SOB, no swelling, no palpitations, constant pain today - had nitro and asa by EMS,  JVD on exam, no edema, heart and lungs clear -   Trop ordered, CXR done and viewed - no pna or ptx, slight cardiomegaly, Pain better with nitro - pain free - hypertensive Nitro paste -  Likely admit for CP / ischemic heart disease - severe sx.  EKG unchanged   EKG Interpretation  Date/Time:  Friday September 21 2020 16:57:01 EST Ventricular Rate:  65 PR Interval:    QRS Duration: 103 QT Interval:  417 QTC Calculation: 434 R Axis:   6 Text Interpretation: Sinus rhythm Atrial premature complex Probable left ventricular hypertrophy Borderline T abnormalities, inferior leads Since last tracing rate slower otherwise unchanged Confirmed by Noemi Chapel [phone]) on 09/21/2020 5:12:47 PM        Medical screening examination/treatment/procedure(s) were conducted as a shared visit with non-physician practitioner(s) and myself.  I personally evaluated the patient during the encounter.  Clinical Impression:   Final diagnoses:  Chest pain, unspecified type  Elevated troponin          Noemi Chapel, MD 09/22/20 1515

## 2020-09-21 NOTE — ED Triage Notes (Signed)
Pt arrived to ED via EMS w/ c/o CP. Pt is a M\W\Sat dialysis pt. Per EMS pt reported onset of CP as 1520 that was centrally located w/ radiation to R chest. EMS reports pt was given 81mg  ASA and 1 SL nitro prior to arrival to scene. EMS reports they gave pt ASA for pt to have a total of 324mg  ASA. Pt initial CP was 8/10 and after meds was 2/10. EMS VS 204/120, 68NSR, 99% RA, 16RR CBG 111.  When provider started assessing pt, pt reports onset of CP at 1000 after eating, reports pain is better when laying down and hurts after drinking water. Pt reports CP in located on the L without radiation. Pt reports N/V, shob. Pt describes pain as sharp and rated 8/10.

## 2020-09-21 NOTE — ED Notes (Signed)
Attempted report x1. 

## 2020-09-22 DIAGNOSIS — Z7982 Long term (current) use of aspirin: Secondary | ICD-10-CM | POA: Diagnosis not present

## 2020-09-22 DIAGNOSIS — N2581 Secondary hyperparathyroidism of renal origin: Secondary | ICD-10-CM | POA: Diagnosis not present

## 2020-09-22 DIAGNOSIS — Z992 Dependence on renal dialysis: Secondary | ICD-10-CM

## 2020-09-22 DIAGNOSIS — I4891 Unspecified atrial fibrillation: Secondary | ICD-10-CM | POA: Diagnosis present

## 2020-09-22 DIAGNOSIS — I160 Hypertensive urgency: Secondary | ICD-10-CM | POA: Diagnosis not present

## 2020-09-22 DIAGNOSIS — E785 Hyperlipidemia, unspecified: Secondary | ICD-10-CM | POA: Diagnosis not present

## 2020-09-22 DIAGNOSIS — Z87891 Personal history of nicotine dependence: Secondary | ICD-10-CM | POA: Diagnosis not present

## 2020-09-22 DIAGNOSIS — I10 Essential (primary) hypertension: Secondary | ICD-10-CM | POA: Diagnosis not present

## 2020-09-22 DIAGNOSIS — N186 End stage renal disease: Secondary | ICD-10-CM

## 2020-09-22 DIAGNOSIS — I5033 Acute on chronic diastolic (congestive) heart failure: Secondary | ICD-10-CM | POA: Diagnosis not present

## 2020-09-22 DIAGNOSIS — N250 Renal osteodystrophy: Secondary | ICD-10-CM | POA: Diagnosis not present

## 2020-09-22 DIAGNOSIS — D649 Anemia, unspecified: Secondary | ICD-10-CM | POA: Diagnosis not present

## 2020-09-22 DIAGNOSIS — F0390 Unspecified dementia without behavioral disturbance: Secondary | ICD-10-CM | POA: Diagnosis present

## 2020-09-22 DIAGNOSIS — I5020 Unspecified systolic (congestive) heart failure: Secondary | ICD-10-CM | POA: Diagnosis not present

## 2020-09-22 DIAGNOSIS — I132 Hypertensive heart and chronic kidney disease with heart failure and with stage 5 chronic kidney disease, or end stage renal disease: Secondary | ICD-10-CM | POA: Diagnosis not present

## 2020-09-22 DIAGNOSIS — Z825 Family history of asthma and other chronic lower respiratory diseases: Secondary | ICD-10-CM | POA: Diagnosis not present

## 2020-09-22 DIAGNOSIS — D631 Anemia in chronic kidney disease: Secondary | ICD-10-CM | POA: Diagnosis present

## 2020-09-22 DIAGNOSIS — Z8674 Personal history of sudden cardiac arrest: Secondary | ICD-10-CM | POA: Diagnosis not present

## 2020-09-22 DIAGNOSIS — I252 Old myocardial infarction: Secondary | ICD-10-CM | POA: Diagnosis not present

## 2020-09-22 DIAGNOSIS — I429 Cardiomyopathy, unspecified: Secondary | ICD-10-CM | POA: Diagnosis not present

## 2020-09-22 DIAGNOSIS — I25119 Atherosclerotic heart disease of native coronary artery with unspecified angina pectoris: Secondary | ICD-10-CM | POA: Diagnosis not present

## 2020-09-22 DIAGNOSIS — R079 Chest pain, unspecified: Secondary | ICD-10-CM | POA: Diagnosis not present

## 2020-09-22 DIAGNOSIS — Z7902 Long term (current) use of antithrombotics/antiplatelets: Secondary | ICD-10-CM | POA: Diagnosis not present

## 2020-09-22 DIAGNOSIS — Z20822 Contact with and (suspected) exposure to covid-19: Secondary | ICD-10-CM | POA: Diagnosis not present

## 2020-09-22 DIAGNOSIS — Z9119 Patient's noncompliance with other medical treatment and regimen: Secondary | ICD-10-CM | POA: Diagnosis not present

## 2020-09-22 DIAGNOSIS — Z79899 Other long term (current) drug therapy: Secondary | ICD-10-CM | POA: Diagnosis not present

## 2020-09-22 DIAGNOSIS — R52 Pain, unspecified: Secondary | ICD-10-CM | POA: Diagnosis not present

## 2020-09-22 DIAGNOSIS — I2510 Atherosclerotic heart disease of native coronary artery without angina pectoris: Secondary | ICD-10-CM | POA: Diagnosis not present

## 2020-09-22 DIAGNOSIS — I5043 Acute on chronic combined systolic (congestive) and diastolic (congestive) heart failure: Secondary | ICD-10-CM | POA: Diagnosis not present

## 2020-09-22 LAB — MRSA PCR SCREENING: MRSA by PCR: NEGATIVE

## 2020-09-22 LAB — TROPONIN I (HIGH SENSITIVITY)
Troponin I (High Sensitivity): 43 ng/L — ABNORMAL HIGH (ref <18)
Troponin I (High Sensitivity): 44 ng/L — ABNORMAL HIGH (ref <18)
Troponin I (High Sensitivity): 47 ng/L — ABNORMAL HIGH (ref <18)
Troponin I (High Sensitivity): 53 ng/L — ABNORMAL HIGH (ref <18)

## 2020-09-22 MED ORDER — METOPROLOL SUCCINATE ER 25 MG PO TB24
25.0000 mg | ORAL_TABLET | ORAL | Status: DC
  Administered 2020-09-22 – 2020-09-23 (×2): 25 mg via ORAL
  Filled 2020-09-22 (×2): qty 1

## 2020-09-22 MED ORDER — HYDRALAZINE HCL 20 MG/ML IJ SOLN: 10.0000 mg | INTRAMUSCULAR | Status: DC | PRN

## 2020-09-22 MED ORDER — RANOLAZINE ER 500 MG PO TB12: 500.0000 mg | ORAL_TABLET | Freq: Two times a day (BID) | ORAL | Status: DC

## 2020-09-22 MED ORDER — CHLORHEXIDINE GLUCONATE CLOTH 2 % EX PADS: 6.0000 | MEDICATED_PAD | Freq: Every day | CUTANEOUS | Status: DC

## 2020-09-22 MED ORDER — AMLODIPINE BESYLATE 5 MG PO TABS
5.0000 mg | ORAL_TABLET | Freq: Once | ORAL | Status: AC
  Administered 2020-09-22: 5 mg via ORAL
  Filled 2020-09-22: qty 1

## 2020-09-22 MED ORDER — NITROGLYCERIN 0.4 MG SL SUBL
0.4000 mg | SUBLINGUAL_TABLET | SUBLINGUAL | Status: AC | PRN
  Administered 2020-09-22 (×3): 0.4 mg via SUBLINGUAL
  Filled 2020-09-22 (×2): qty 1

## 2020-09-22 MED ORDER — HEPARIN SODIUM (PORCINE) 1000 UNIT/ML DIALYSIS: 20.0000 [IU]/kg | INTRAMUSCULAR | Status: DC | PRN

## 2020-09-22 MED ORDER — ASPIRIN 81 MG PO TBEC: 81.0000 mg | DELAYED_RELEASE_TABLET | Freq: Every day | ORAL | Status: DC

## 2020-09-22 MED ORDER — HYDRALAZINE HCL 25 MG PO TABS: 25.0000 mg | ORAL_TABLET | ORAL | Status: DC | PRN

## 2020-09-22 MED ORDER — SODIUM CHLORIDE 0.9 % IV SOLN: 100.0000 mL | INTRAVENOUS | Status: DC | PRN

## 2020-09-22 MED ORDER — NITROGLYCERIN 0.4 MG SL SUBL
SUBLINGUAL_TABLET | SUBLINGUAL | Status: AC
  Filled 2020-09-22: qty 1

## 2020-09-22 MED ORDER — ISOSORBIDE MONONITRATE ER 30 MG PO TB24
30.0000 mg | ORAL_TABLET | Freq: Once | ORAL | Status: AC
  Administered 2020-09-22: 30 mg via ORAL
  Filled 2020-09-22: qty 1

## 2020-09-22 MED ORDER — HEPARIN SODIUM (PORCINE) 1000 UNIT/ML DIALYSIS: 1000.0000 [IU] | INTRAMUSCULAR | Status: DC | PRN

## 2020-09-22 MED ORDER — MORPHINE SULFATE (PF) 2 MG/ML IV SOLN
2.0000 mg | Freq: Once | INTRAVENOUS | Status: AC
  Administered 2020-09-22: 2 mg via INTRAVENOUS
  Filled 2020-09-22: qty 1

## 2020-09-22 MED ORDER — ISOSORBIDE MONONITRATE ER 60 MG PO TB24
90.0000 mg | ORAL_TABLET | Freq: Every day | ORAL | Status: DC
  Administered 2020-09-23: 90 mg via ORAL
  Filled 2020-09-22: qty 1

## 2020-09-22 MED ORDER — SODIUM CHLORIDE 0.9 % IV SOLN: INTRAVENOUS | Status: DC

## 2020-09-22 MED ORDER — NITROGLYCERIN 0.4 MG SL SUBL
0.4000 mg | SUBLINGUAL_TABLET | SUBLINGUAL | Status: AC | PRN
  Administered 2020-09-22 (×3): 0.4 mg via SUBLINGUAL

## 2020-09-22 MED ORDER — LIDOCAINE HCL (PF) 1 % IJ SOLN: 5.0000 mL | INTRAMUSCULAR | Status: DC | PRN

## 2020-09-22 MED ORDER — ATORVASTATIN CALCIUM 80 MG PO TABS
80.0000 mg | ORAL_TABLET | Freq: Every day | ORAL | Status: DC
  Administered 2020-09-22 – 2020-09-23 (×2): 80 mg via ORAL
  Filled 2020-09-22 (×2): qty 1

## 2020-09-22 MED ORDER — LIDOCAINE-PRILOCAINE 2.5-2.5 % EX CREA: 1.0000 "application " | TOPICAL_CREAM | CUTANEOUS | Status: DC | PRN

## 2020-09-22 MED ORDER — ASPIRIN 81 MG PO CHEW
81.0000 mg | CHEWABLE_TABLET | Freq: Every day | ORAL | Status: DC
  Administered 2020-09-22 – 2020-09-23 (×2): 81 mg via ORAL
  Filled 2020-09-22 (×2): qty 1

## 2020-09-22 MED ORDER — PENTAFLUOROPROP-TETRAFLUOROETH EX AERO: 1.0000 "application " | INHALATION_SPRAY | CUTANEOUS | Status: DC | PRN

## 2020-09-22 MED ORDER — CLOPIDOGREL BISULFATE 75 MG PO TABS
75.0000 mg | ORAL_TABLET | Freq: Every day | ORAL | Status: DC
  Administered 2020-09-22 – 2020-09-23 (×2): 75 mg via ORAL
  Filled 2020-09-22 (×2): qty 1

## 2020-09-22 MED ORDER — LABETALOL HCL 5 MG/ML IV SOLN
10.0000 mg | INTRAVENOUS | Status: DC | PRN
  Administered 2020-09-22: 10 mg via INTRAVENOUS
  Filled 2020-09-22: qty 4

## 2020-09-22 MED ORDER — AMLODIPINE BESYLATE 10 MG PO TABS
10.0000 mg | ORAL_TABLET | Freq: Every day | ORAL | Status: DC
  Administered 2020-09-23: 10 mg via ORAL
  Filled 2020-09-22: qty 1

## 2020-09-22 MED ORDER — NITROGLYCERIN 0.4 MG SL SUBL
0.4000 mg | SUBLINGUAL_TABLET | SUBLINGUAL | Status: AC | PRN
  Administered 2020-09-22 (×4): 0.4 mg via SUBLINGUAL
  Filled 2020-09-22 (×2): qty 1

## 2020-09-22 MED ORDER — ISOSORBIDE MONONITRATE ER 60 MG PO TB24
60.0000 mg | ORAL_TABLET | Freq: Every day | ORAL | Status: DC
  Administered 2020-09-22: 60 mg via ORAL
  Filled 2020-09-22: qty 1

## 2020-09-22 MED ORDER — MORPHINE SULFATE (PF) 2 MG/ML IV SOLN
2.0000 mg | INTRAVENOUS | Status: DC | PRN
  Administered 2020-09-22: 2 mg via INTRAVENOUS

## 2020-09-22 NOTE — Progress Notes (Signed)
Pt c/o chest pain. Nitro x2 given. Pt currently cp free.

## 2020-09-22 NOTE — Assessment & Plan Note (Signed)
-  Recent cath performed July 2021.  No utility in repeat cath at this time -Remains poor candidate for CABG due to some noncompliance and poor revascularization targets -Plan is to continue on medical management and controlling blood pressure -Continue aspirin, Lipitor, Plavix, Toprol

## 2020-09-22 NOTE — Assessment & Plan Note (Signed)
-   MWF schedule; last session was on Wednesday -Undergoing dialysis today -Appreciate nephrology assistance -Continue on regular schedule at discharge

## 2020-09-22 NOTE — Consult Note (Signed)
[facility] Renal Consultation Note    Indication for Consultation:  Management of ESRD/hemodialysis, anemia, hypertension/volume, and secondary hyperparathyroidism. PCP:  HPI: Kenneth [name] is a 64 y.o. male with ESRD, HTN, known multivessel CAD, HFrEF (30-35%), Hx alcohol use, dementia, and A-fib who was admitted OBS status with CP, for ACS rule out.  Tells me CP started suddenly on 11/26 with some radiation to neck and mild dyspnea. No diaphoresis, nausea, or vomiting. No abdominal pain, fever or chills. He presented to ED via EMS - found to be hypertensive. EKG showed some TWI and troponins were trended 43 -> 44 -> 47 -> 53. He was given ASA and nitro for CP with relief. Labs showed Na 142, K 4.4, WBC 6.6, Hgb 11.3. COVID/Flu negative. Cardiology consulted this morning - no cath planned. Amlodipine and Ranexa were added, imdur increased.  Dialyzes on TTS schedule - this week was MWSa d/t holiday. He is due for HD today.   Past Medical History:  Diagnosis Date  . Acute CHF ([place]) 01/2018  . Cardiomyopathy secondary    likely related to HTN heart disease; possibly ETOH related as well  . Chronic combined systolic and diastolic heart failure (HCC)    Echocardiogram 09/22/11: Moderate LVH, EF 40-98%, grade 3 diastolic dysfunction, mild MR, moderate to severe LAE, mild RVE, mild to moderate TR, small to moderate pericardial effusion  . Coronary artery disease    not felt to be candidate for CABG 08/2018; medical thearpy recomended due to high risk of PCI   . Dysrhythmia    aflutter 04/2016, afib 02/2018, not felt to be a candidate for anticoagulation due to non-compliance and ETOH  . ESRD (end stage renal disease) on dialysis ([place])    due to hypertensive nephrosclerosis; TTS; [address] (09/01/2018)  . History of alcohol abuse   . Hypertension   . Myocardial infarction [facility])    " mild " per daughter  . PEA (Pulseless electrical activity) ([place])    PEA arrest 03/09/18, treated  empirically for hyperkalemia, shock x1 for WCT, given amiodarone, ROSC after 10 min of ACLS   Past Surgical History:  Procedure Laterality Date  . AV FISTULA PLACEMENT Left 05/14/2016   Procedure: LEFT ARM BASILIC VEIN TRANSPOSITION;  Surgeon: Rosetta Posner, MD;  Location: [place];  Service: Vascular;  Laterality: Left;  . INSERTION OF DIALYSIS CATHETER N/A 06/20/2019   Procedure: INSERTION OF TUNNELED  DIALYSIS CATHETER;  Surgeon: Elam Dutch, MD;  Location: [place];  Service: Vascular;  Laterality: N/A;  . LEFT HEART CATH AND CORONARY ANGIOGRAPHY N/A 09/02/2018   Procedure: LEFT HEART CATH AND CORONARY ANGIOGRAPHY;  Surgeon: Nelva Bush, MD;  Location: [place] CV LAB;  Service: Cardiovascular;  Laterality: N/A;  . LEFT HEART CATH AND CORONARY ANGIOGRAPHY N/A 05/14/2020   Procedure: LEFT HEART CATH AND CORONARY ANGIOGRAPHY;  Surgeon: Sherren Mocha, MD;  Location: [place] CV LAB;  Service: Cardiovascular;  Laterality: N/A;  . PERIPHERAL VASCULAR CATHETERIZATION N/A 05/13/2016   Procedure: Dialysis/Perma Catheter Insertion;  Surgeon: Serafina Mitchell, MD;  Location: [place] CV LAB;  Service: Cardiovascular;  Laterality: N/A;  . REVISION OF ARTERIOVENOUS GORETEX GRAFT Left 11/14/1476   Procedure: PLICATION OF ARTERIOVENOUS FISTULA LEFT ARM;  Surgeon: Elam Dutch, MD;  Location: [facility];  Service: Vascular;  Laterality: Left;   Family History  Problem Relation Age of Onset  . Emphysema Mother   . Cirrhosis Father    Social History:  reports that he quit  smoking about 2 years ago. His smoking use included cigars. He has a 96.00 pack-year smoking history. He has quit using smokeless tobacco.  His smokeless tobacco use included chew. He reports current alcohol use. He reports that he does not use drugs.  ROS: As per HPI otherwise negative.  Physical Exam: Vitals:   09/22/20 0739 09/22/20 0856 09/22/20 0931 09/22/20 1003  BP: (!) 185/89 (!) 200/86 126/78 140/77  Pulse: 63 74  72 78  Resp:    18  Temp:    97.8 F (36.6 C)  TempSrc:    Oral  SpO2:    99%  Weight:      Height:         General: Well developed, well nourished, in no acute distress. Room air. Head: Normocephalic, atraumatic, sclera non-icteric, mucus membranes are moist. Neck: Supple without lymphadenopathy/masses. JVD not elevated. Lungs: Clear bilaterally to auscultation without wheezes, rales, or rhonchi. Breathing is unlabored. Heart: RRR with normal S1, S2. No murmurs, rubs, or gallops appreciated. Abdomen: Soft, non-tender, non-distended with normoactive bowel sounds. No rebound/guarding. Musculoskeletal:  Strength and tone appear normal for age. Lower extremities: No edema or ischemic changes, no open wounds. Neuro: Alert and oriented X 3. Moves all extremities spontaneously. Psych:  Responds to questions appropriately with a normal affect. Dialysis Access: AVF + bruit  No Known Allergies Prior to Admission medications   Medication Sig Start Date End Date Taking? Authorizing Provider  aspirin EC 81 MG EC tablet Take 1 tablet (81 mg total) by mouth daily. Swallow whole. 05/16/20  Yes Alma Friendly, MD  atorvastatin (LIPITOR) 80 MG tablet Take 1 tablet (80 mg total) by mouth daily. 03/16/20  Yes [name], [name], MD  clopidogrel (PLAVIX) 75 MG tablet Take 1 tablet by mouth once daily Patient taking differently: Take 75 mg by mouth daily.  05/17/20  Yes Meng, Isaac Laud, PA  diphenhydramine-acetaminophen (TYLENOL PM EXTRA STRENGTH) 25-500 MG TABS tablet Take 2 tablets by mouth at bedtime as needed (Pain, Sleep).  05/12/18  Yes [provider]  isosorbide mononitrate (IMDUR) 60 MG 24 hr tablet Take 1 tablet (60 mg total) by mouth daily. 06/28/20  Yes [name], [name], MD  metoprolol succinate (TOPROL-XL) 25 MG 24 hr tablet Take 1 tablet (25 mg total) by mouth 4 (four) times a week. Take on Sunday, Monday, Wednesday, Friday 06/26/20  Yes [name], [name], MD  nitroGLYCERIN (NITROSTAT) 0.4 MG SL  tablet DISSOLVE ONE TABLET UNDER THE TONGUE EVERY 5 MINUTES AS NEEDED FOR CHEST PAIN.  DO NOT EXCEED A TOTAL OF 3 DOSES IN 15 MINUTES Patient taking differently: Place 0.4 mg under the tongue every 5 (five) minutes as needed for chest pain.  09/04/20  Yes [name], [name], MD   Current Facility-Administered Medications  Medication Dose Route Frequency Provider Last Rate Last Admin  . 0.9 %  sodium chloride infusion   Intravenous Continuous Garba, Mohammad L, MD      . 0.9 %  sodium chloride infusion   Intravenous Continuous Chotiner, Yevonne Aline, MD      . acetaminophen (TYLENOL) tablet 650 mg  650 mg Oral Q4H PRN Elwyn Reach, MD      . Derrill Memo ON 09/23/2020] amLODipine (NORVASC) tablet 10 mg  10 mg Oral Daily Dwyane Dee, MD      . aspirin chewable tablet 81 mg  81 mg Oral Daily Cala Bradford, RPH   81 mg at 09/22/20 0853  . atorvastatin (LIPITOR) tablet 80 mg  80 mg Oral Daily Dwyane Dee, MD   80 mg at 09/22/20 0853  . clopidogrel (PLAVIX) tablet 75 mg  75 mg Oral Daily Dwyane Dee, MD   75 mg at 09/22/20 0853  . heparin injection 5,000 Units  5,000 Units Subcutaneous Q8H Elwyn Reach, MD   5,000 Units at 09/22/20 0504  . hydrALAZINE (APRESOLINE) injection 10 mg  10 mg Intravenous Q4H PRN Dwyane Dee, MD      . isosorbide mononitrate (IMDUR) 24 hr tablet 30 mg  30 mg Oral Once Ledora Bottcher, [place]      . Derrill Memo ON 09/23/2020] isosorbide mononitrate (IMDUR) 24 hr tablet 90 mg  90 mg Oral Daily Duke, Tami Lin, [place]      . labetalol (NORMODYNE) injection 10 mg  10 mg Intravenous Q4H PRN Dwyane Dee, MD   10 mg at 09/22/20 0904  . metoprolol succinate (TOPROL-XL) 24 hr tablet 25 mg  25 mg Oral Once per day on Sun Tue Thu Sat Dwyane Dee, MD   25 mg at 09/22/20 0855  . morphine 2 MG/ML injection 2 mg  2 mg Intravenous Once Loni Beckwith, PA-C      . morphine 2 MG/ML injection 2 mg  2 mg Intravenous Q2H PRN Dwyane Dee, MD   2 mg at 09/22/20 0802  .  ondansetron (ZOFRAN) injection 4 mg  4 mg Intravenous Q6H PRN Elwyn Reach, MD      . ranolazine (RANEXA) 12 hr tablet 500 mg  500 mg Oral BID Ledora Bottcher, PA       Labs: Basic Metabolic Panel: Recent Labs  Lab 09/21/20 1708  NA 142  K 4.4  CL 95*  CO2 30  GLUCOSE 96  BUN 63*  CREATININE 11.00*  CALCIUM 9.5   Liver Function Tests: Recent Labs  Lab 09/21/20 1708  AST 12*  ALT 9  ALKPHOS 55  BILITOT 0.6  PROT 7.4  ALBUMIN 3.2*   CBC: Recent Labs  Lab 09/21/20 1708  WBC 6.6  HGB 11.3*  HCT 33.9*  MCV 96.0  PLT 204   Studies/Results: DG Chest Port 1 View  Result Date: 09/21/2020 CLINICAL DATA:  Chest pain short of breath EXAM: PORTABLE CHEST 1 VIEW COMPARISON:  09/02/2020 FINDINGS: Mildly diminished lung volumes. Mild cardiomegaly. Negative for edema, focal opacity or pleural effusion. No pneumothorax. Aortic atherosclerosis IMPRESSION: Mild cardiomegaly. No edema or infiltrate. Electronically Signed   By: Donavan Foil M.D.   On: 09/21/2020 17:11   Dialysis Orders:  TTS at [facility] 4hr, 400/800, EDW 85kg, 2K/2Ca, AVF, heparin 2400 - Sensipar 30mg  PO q HD - Calcitriol 2.49mcg PO q HD - No ESA  Assessment/Plan: 1.  Chest pain, angina: Trop trend flat, cardiology consulted - added amlod, ranexa, and ^ isosorbide dose. No LHC planned. 2.  ESRD: Continue HD per usual TTS sched - HD today. 3.  Hypertension/volume: BP now controlled, about 3kg above EDW - will plan that as his goal. 4.  Anemia: Hgb 11.3 - no ESA needed. 5.  Metabolic bone disease: Ca ok, Phos pending - resume home binders. 6. Dispo: Likely can d/c home after dialysis or early tomorrow if stable.  Veneta Penton, PA-C 09/22/2020, 10:47 AM  [employer]

## 2020-09-22 NOTE — Hospital Course (Addendum)
Kenneth [name] is a 64 yo male with PMH severe multivessel CAD (not a CABG candidate due to noncompliance concern and poor revascularization targets), ESRD on HD, HTN, Gr 1 DD who presented to the ER with chest pain.  He had missed his Friday session due to the holidays and was last dialyzed on Wednesday.  He was found to be severely hypertensive SBP around 200 in the ER. There was some concern for possible ACS.  He underwent EKG and troponin trending.  He had T wave inversions noted in lateral leads that had been present on prior as well.  Troponins indeterminate in setting of renal failure.  Nephrology and cardiology were consulted on admission as well.  He was taken for dialysis on 09/22/2020. He had a recent heart cath in July 2021.  There was felt to be no need for repeat cath at this time and tighter blood pressure control was recommended which was likely contributing to his chest pain with underlying severe CAD. He was started on amlodipine and his Imdur was also increased.  He does become hypotensive sometimes during dialysis, therefore amlodipine may need to be possibly held on dialysis days if becomes significantly hypotensive. He did tolerate medications well and his chest pain resolved.  He was continued on increased dose of Imdur and amlodipine at discharge.

## 2020-09-22 NOTE — Progress Notes (Signed)
PT c/o chest pain 7/10. EKG obtained. Nitro x 3 given. Pt now chest pain free. MD notified.

## 2020-09-22 NOTE — Procedures (Signed)
Patient seen and examined on Hemodialysis. BP 140/74 (BP Location: Right Arm)   Pulse 72   Temp 97.8 F (36.6 C) (Oral)   Resp 18   Ht 6' (1.829 m)   Wt 88.7 kg   SpO2 94%   BMI 26.53 kg/m   QB 400 mL/ min via LUE AVF UF goal 3.5L  Tolerating treatment without complaints at this time.   Madelon Lips MD [facility]  pgr [phone] 1:14 PM

## 2020-09-22 NOTE — Progress Notes (Signed)
PROGRESS NOTE    TEVION LAFORGE   NKN:[id number]  DOB: May 08, 1956  DOA: 09/21/2020     0  PCP: [name], [name], MD  CC: [facility] Course: Mr. Tiger is a 64 yo male with PMH severe multivessel CAD (not a CABG candidate due to noncompliance concern and poor revascularization targets), ESRD on HD, HTN, Gr 1 DD who presented to the ER with chest pain.  He had missed his Friday session due to the holidays and was last dialyzed on Wednesday.  He was found to be severely hypertensive SBP around 200 in the ER. There was some concern for possible ACS.  He underwent EKG and troponin trending.  He had T wave inversions noted in lateral leads that had been present on prior as well.  Troponins indeterminate in setting of renal failure.  Nephrology and cardiology were consulted on admission as well.  He was taken for dialysis on 09/22/2020. He had a recent heart cath in July 2021.  There was felt to be no need for repeat cath at this time and tighter blood pressure control was recommended which was likely contributing to his chest pain with underlying severe CAD. He was started on amlodipine and his Imdur was also increased.  He does become hypotensive sometimes during dialysis, therefore amlodipine may need to be possibly held on dialysis days if becomes significantly hypotensive.   Interval History:  Seen this morning sitting up in bed appearing more comfortable than described on admission.  Still having ongoing chest pain but it has improved some.  Blood pressure is also starting to come down when seen this morning.  He is planning to undergo dialysis today as well.  Old records reviewed in assessment of this patient  ROS: Constitutional: negative for chills and fevers, Respiratory: negative for cough, Cardiovascular: positive for chest pain and Gastrointestinal: negative for abdominal pain  Assessment & Plan: * Chest pain -SBP around 200 on admission.  With underlying severe CAD being managed  medically, this is likely chest pain induced by severe hypertension -Amlodipine increased to 10mg  this morning -Cardiology has also increased Imdur to 90 mg daily - CP has dramatically improved with these changes; appreciate cardiology assistance - continue monitoring CP further overnight; if stable, hopeful for d/c tomorrow   Coronary artery disease involving native heart with angina pectoris [facility]) -Recent cath performed July 2021.  No utility in repeat cath at this time -Remains poor candidate for CABG due to some noncompliance and poor revascularization targets -Plan is to continue on medical management and controlling blood pressure -Continue aspirin, Lipitor, Plavix, Toprol  Acute on chronic diastolic CHF (congestive heart failure) ([place]) -Last echo July 2021, EF 55 to 37%, grade 1 diastolic dysfunction, moderate LVH -Continue volume removal with dialysis  ESRD on hemodialysis ([place]) - MWF schedule; last session was on Wednesday -Undergoing dialysis today -Appreciate nephrology assistance -Continue on regular schedule at discharge  Essential hypertension -Blood pressure uncontrolled on admission likely contributing to chest pain -Amlodipine increased to 10 mg -Imdur increased to 90 mg -Continue Toprol -Labetalol and hydralazine as needed also ordered    Antimicrobials: None  DVT prophylaxis: HSQ Code Status: Full Family Communication: Present Disposition Plan: Status is: Inpatient  Remains inpatient appropriate because:Ongoing active pain requiring inpatient pain management, Unsafe d/c plan, IV treatments appropriate due to intensity of illness or inability to take PO and Inpatient level of care appropriate due to severity of illness   Dispo: The patient is from: Home  Anticipated d/c is to: Home              Anticipated d/c date is: 1 day              Patient currently is not medically stable to d/c.       Objective: Blood pressure 118/64, pulse  68, temperature 97.6 F (36.4 C), temperature source Oral, resp. rate 14, height 6' (1.829 m), weight 89 kg, SpO2 94 %.  Examination: General appearance: Adult man with slightly slowed mentation sitting up in bed in no distress Head: Normocephalic, without obvious abnormality, atraumatic Eyes: EOMI Lungs: clear to auscultation bilaterally Heart: regular rate and rhythm and S1, S2 normal Abdomen: normal findings: bowel sounds normal and soft, non-tender Extremities: no edema Skin: mobility and turgor normal Neurologic: Grossly normal  Consultants:   Nephrology  Cardiology  Procedures:   none  Data Reviewed: I have personally reviewed following labs and imaging studies Results for orders placed or performed during the hospital encounter of 09/21/20 (from the past 24 hour(s))  Troponin I (High Sensitivity)     Status: Abnormal   Collection Time: 09/21/20  5:08 PM  Result Value Ref Range   Troponin I (High Sensitivity) 32 (H) <18 ng/L  Comprehensive metabolic panel     Status: Abnormal   Collection Time: 09/21/20  5:08 PM  Result Value Ref Range   Sodium 142 135 - 145 mmol/L   Potassium 4.4 3.5 - 5.1 mmol/L   Chloride 95 (L) 98 - 111 mmol/L   CO2 30 22 - 32 mmol/L   Glucose, Bld 96 70 - 99 mg/dL   BUN 63 (H) 8 - 23 mg/dL   Creatinine, Ser 11.00 (H) 0.61 - 1.24 mg/dL   Calcium 9.5 8.9 - 10.3 mg/dL   Total Protein 7.4 6.5 - 8.1 g/dL   Albumin 3.2 (L) 3.5 - 5.0 g/dL   AST 12 (L) 15 - 41 U/L   ALT 9 0 - 44 U/L   Alkaline Phosphatase 55 38 - 126 U/L   Total Bilirubin 0.6 0.3 - 1.2 mg/dL   GFR, Estimated 5 (L) >60 mL/min   Anion gap 17 (H) 5 - 15  CBC     Status: Abnormal   Collection Time: 09/21/20  5:08 PM  Result Value Ref Range   WBC 6.6 4.0 - 10.5 K/uL   RBC 3.53 (L) 4.22 - 5.81 MIL/uL   Hemoglobin 11.3 (L) 13.0 - 17.0 g/dL   HCT 33.9 (L) 39 - 52 %   MCV 96.0 80.0 - 100.0 fL   MCH 32.0 26.0 - 34.0 pg   MCHC 33.3 30.0 - 36.0 g/dL   RDW 17.2 (H) 11.5 - 15.5 %    Platelets 204 150 - 400 K/uL   nRBC 0.0 0.0 - 0.2 %  Troponin I (High Sensitivity)     Status: Abnormal   Collection Time: 09/21/20  6:56 PM  Result Value Ref Range   Troponin I (High Sensitivity) 37 (H) <18 ng/L  HIV Antibody (routine testing w rflx)     Status: None   Collection Time: 09/21/20  9:08 PM  Result Value Ref Range   HIV Screen 4th Generation wRfx Non Reactive Non Reactive  Troponin I (High Sensitivity)     Status: Abnormal   Collection Time: 09/21/20  9:08 PM  Result Value Ref Range   Troponin I (High Sensitivity) 45 (H) <18 ng/L  Lipid panel     Status: Abnormal   Collection Time: 09/21/20  9:08 PM  Result Value Ref Range   Cholesterol 167 0 - 200 mg/dL   Triglycerides 69 <150 mg/dL   HDL 44 >40 mg/dL   Total CHOL/HDL Ratio 3.8 RATIO   VLDL 14 0 - 40 mg/dL   LDL Cholesterol 109 (H) 0 - 99 mg/dL  Resp Panel by RT-PCR (Flu A&B, Covid) Nasopharyngeal Swab     Status: None   Collection Time: 09/21/20  9:19 PM   Specimen: Nasopharyngeal Swab; Nasopharyngeal(NP) swabs in vial transport medium  Result Value Ref Range   SARS Coronavirus 2 by RT PCR NEGATIVE NEGATIVE   Influenza A by PCR NEGATIVE NEGATIVE   Influenza B by PCR NEGATIVE NEGATIVE  MRSA PCR Screening     Status: None   Collection Time: 09/21/20 10:57 PM   Specimen: Nasal Mucosa; Nasopharyngeal  Result Value Ref Range   MRSA by PCR NEGATIVE NEGATIVE  Troponin I (High Sensitivity)     Status: Abnormal   Collection Time: 09/21/20 11:27 PM  Result Value Ref Range   Troponin I (High Sensitivity) 43 (H) <18 ng/L  Troponin I (High Sensitivity)     Status: Abnormal   Collection Time: 09/22/20  2:38 AM  Result Value Ref Range   Troponin I (High Sensitivity) 44 (H) <18 ng/L  Troponin I (High Sensitivity)     Status: Abnormal   Collection Time: 09/22/20  4:35 AM  Result Value Ref Range   Troponin I (High Sensitivity) 47 (H) <18 ng/L  Troponin I (High Sensitivity)     Status: Abnormal   Collection Time: 09/22/20   6:20 AM  Result Value Ref Range   Troponin I (High Sensitivity) 53 (H) <18 ng/L    Recent Results (from the past 240 hour(s))  Resp Panel by RT-PCR (Flu A&B, Covid) Nasopharyngeal Swab     Status: None   Collection Time: 09/21/20  9:19 PM   Specimen: Nasopharyngeal Swab; Nasopharyngeal(NP) swabs in vial transport medium  Result Value Ref Range Status   SARS Coronavirus 2 by RT PCR NEGATIVE NEGATIVE Final    Comment: (NOTE) SARS-CoV-2 target nucleic acids are NOT DETECTED.  The SARS-CoV-2 RNA is generally detectable in upper respiratory specimens during the acute phase of infection. The lowest concentration of SARS-CoV-2 viral copies this assay can detect is 138 copies/mL. A negative result does not preclude SARS-Cov-2 infection and should not be used as the sole basis for treatment or other patient management decisions. A negative result may occur with  improper specimen collection/handling, submission of specimen other than nasopharyngeal swab, presence of viral mutation(s) within the areas targeted by this assay, and inadequate number of viral copies(<138 copies/mL). A negative result must be combined with clinical observations, patient history, and epidemiological information. The expected result is Negative.  Fact Sheet for Patients:  EntrepreneurPulse.com.au  Fact Sheet for Healthcare Providers:  IncredibleEmployment.be  This test is no t yet approved or cleared by the [place] FDA and  has been authorized for detection and/or diagnosis of SARS-CoV-2 by FDA under an Emergency Use Authorization (EUA). This EUA will remain  in effect (meaning this test can be used) for the duration of the COVID-19 declaration under Section 564(b)(1) of the Act, 21 U.S.C.section 360bbb-3(b)(1), unless the authorization is terminated  or revoked sooner.       Influenza A by PCR NEGATIVE NEGATIVE Final   Influenza B by PCR NEGATIVE NEGATIVE Final     Comment: (NOTE) The Xpert Xpress SARS-CoV-2/FLU/RSV plus assay is intended as an aid in the  diagnosis of influenza from Nasopharyngeal swab specimens and should not be used as a sole basis for treatment. Nasal washings and aspirates are unacceptable for Xpert Xpress SARS-CoV-2/FLU/RSV testing.  Fact Sheet for Patients: EntrepreneurPulse.com.au  Fact Sheet for Healthcare Providers: IncredibleEmployment.be  This test is not yet approved or cleared by the [place] FDA and has been authorized for detection and/or diagnosis of SARS-CoV-2 by FDA under an Emergency Use Authorization (EUA). This EUA will remain in effect (meaning this test can be used) for the duration of the COVID-19 declaration under Section 564(b)(1) of the Act, 21 U.S.C. section 360bbb-3(b)(1), unless the authorization is terminated or revoked.  Performed at [facility], [place] [address] [place], [place] [address]   MRSA PCR Screening     Status: None   Collection Time: 09/21/20 10:57 PM   Specimen: Nasal Mucosa; Nasopharyngeal  Result Value Ref Range Status   MRSA by PCR NEGATIVE NEGATIVE Final    Comment:        The GeneXpert MRSA Assay (FDA approved for NASAL specimens only), is one component of a comprehensive MRSA colonization surveillance program. It is not intended to diagnose MRSA infection nor to guide or monitor treatment for MRSA infections. Performed at [facility], [place] [address], [place], [place] [address]      Radiology Studies: DG Chest Port 1 View  Result Date: 09/21/2020 CLINICAL DATA:  Chest pain short of breath EXAM: PORTABLE CHEST 1 VIEW COMPARISON:  09/02/2020 FINDINGS: Mildly diminished lung volumes. Mild cardiomegaly. Negative for edema, focal opacity or pleural effusion. No pneumothorax. Aortic atherosclerosis IMPRESSION: Mild cardiomegaly. No edema or infiltrate. Electronically Signed   By: Donavan Foil M.D.   On: 09/21/2020  17:11   DG Chest Port 1 View  Final Result      Scheduled Meds: . [START ON 09/23/2020] amLODipine  10 mg Oral Daily  . aspirin  81 mg Oral Daily  . atorvastatin  80 mg Oral Daily  . clopidogrel  75 mg Oral Daily  . heparin  5,000 Units Subcutaneous Q8H  . [START ON 09/23/2020] isosorbide mononitrate  90 mg Oral Daily  . metoprolol succinate  25 mg Oral Once per day on Sun Tue Thu Sat  .  morphine injection  2 mg Intravenous Once   PRN Meds: sodium chloride, sodium chloride, acetaminophen, heparin, heparin, hydrALAZINE, labetalol, lidocaine (PF), lidocaine-prilocaine, morphine injection, ondansetron (ZOFRAN) IV, pentafluoroprop-tetrafluoroeth Continuous Infusions: . sodium chloride    . sodium chloride    . sodium chloride    . sodium chloride       LOS: 0 days  Time spent: Greater than 50% of the 35 minute visit was spent in counseling/coordination of care for the patient as laid out in the A&P.   Dwyane Dee, MD [facility] 09/22/2020, 3:21 PM

## 2020-09-22 NOTE — Progress Notes (Signed)
PT c/o 8/10 chest pain upon standing. MD notified. Stated to give nitro x 1 followed by 2mg  morphine.

## 2020-09-22 NOTE — Assessment & Plan Note (Addendum)
-  SBP around 200 on admission.  With underlying severe CAD being managed medically, this is likely chest pain induced by severe hypertension -Amlodipine increased to 10mg  this morning -Cardiology has also increased Imdur to 90 mg daily - CP has dramatically improved with these changes; appreciate cardiology assistance -Remained chest pain-free prior to discharge

## 2020-09-22 NOTE — Assessment & Plan Note (Signed)
-  Last echo July 2021, EF 55 to 81%, grade 1 diastolic dysfunction, moderate LVH -Continue volume removal with dialysis

## 2020-09-22 NOTE — Consult Note (Addendum)
Cardiology Consultation:   Patient ID: EVANDER MACARAEG MRN: [id number]; DOB: November 02, 1955  Admit date: 09/21/2020 Date of Consult: 09/22/2020  Primary Care Provider: [name], [name], MD [facility] Cardiologist: Peter [name], MD  [facility] Electrophysiologist:  None    Patient Profile:   PAGE LANCON is a 64 y.o. male with a hx of multivessel CAD medically managed, chronic systolic and diastolic heart failure, heavy ETOH use, Afib/flutter not a candidate for anticoagulation due to noncompliance, ESRD on HD, HTN, and NSVT  who is being seen today for the evaluation of chest pain at the request of Dr. Sabino Gasser.  History of Present Illness:   Mr. Jawad has a history of multivessel CAD in the LAD, LCx and bifurcation, and RCA. He has been medically managed since 08/2018 due to poor revascularization targets. He was turned down for CABG, in part due to medical noncompliance. He is ESRD on HD and has a history of CP during HD, most recently prompting ER evaluation and admission 05/12/20. In addition, he has a history of hospitalization with CHF related to HD volume removal issues - one occasion complicated by cardiac arrest (02/2018 - PEA then shock for WCT --> briefly Afib RVR). His EF has been as low as 25-30% in 2017, but was improved to 65-70% on echo 11/2017. Echo 03/09/18 with severe LVH, EF 55-60% and moderately decreased RV function.   During his last admission July 2021 in which he was admitted with chest pain at the end of HD, he underwent repeat heart cath for STE III and aVF with ST depression anteriorly and laterally. LHC continued to show severe MV disease with distal LM, ostial/proximal LAD, proximal left Cx/OM, and proximal 75% RCA and distal RCA with CTO with left-to-right collaterals. Continued medical therapy was recommended as risk of protected PCI would be extremely high. Echo at that time with EF 55-60%, moderate LVH, grade 1 DD, and moderate MR. He was not seen in follow up  after this heart cath for TOC.   He presented back to [facility] 09/21/20 with chest pain radiating to left and jaw relieved with nitro. He was also hypertensive 197/88. He was admitted for MI rule out.   HS troponin 43 --> 44 --> 47 --> 53 EKG sinus rhythm HR 69, TWI inferior and lateral leads  At home, he is on ASA, plavix, 80 mg lipitor, 60 mg imdur, and 25 mg toprol. He reports compliance with medications. His last HD session was Wed due to holiday this week. He denies dyspnea or swelling. He reports continued chest pain of 3/10 despite nitro paste. He states CP started after eating spicy ham. CP has been persistent. Primary team has added 10 mg amlodipine. He does report hypotension with HD. He continues to have elevated BP, seems a bit better controlled mid-morning.    Past Medical History:  Diagnosis Date  . Acute CHF ([place]) 01/2018  . Cardiomyopathy secondary    likely related to HTN heart disease; possibly ETOH related as well  . Chronic combined systolic and diastolic heart failure (HCC)    Echocardiogram 09/22/11: Moderate LVH, EF 15-40%, grade 3 diastolic dysfunction, mild MR, moderate to severe LAE, mild RVE, mild to moderate TR, small to moderate pericardial effusion  . Coronary artery disease    not felt to be candidate for CABG 08/2018; medical thearpy recomended due to high risk of PCI   . Dysrhythmia    aflutter 04/2016, afib 02/2018, not felt to be a candidate for anticoagulation  due to non-compliance and ETOH  . ESRD (end stage renal disease) on dialysis ([place])    due to hypertensive nephrosclerosis; TTS; [address] (09/01/2018)  . History of alcohol abuse   . Hypertension   . Myocardial infarction [facility])    " mild " per daughter  . PEA (Pulseless electrical activity) ([place])    PEA arrest 03/09/18, treated empirically for hyperkalemia, shock x1 for WCT, given amiodarone, ROSC after 10 min of ACLS    Past Surgical History:  Procedure Laterality Date  . AV FISTULA PLACEMENT Left  05/14/2016   Procedure: LEFT ARM BASILIC VEIN TRANSPOSITION;  Surgeon: Rosetta Posner, MD;  Location: [place];  Service: Vascular;  Laterality: Left;  . INSERTION OF DIALYSIS CATHETER N/A 06/20/2019   Procedure: INSERTION OF TUNNELED  DIALYSIS CATHETER;  Surgeon: Elam Dutch, MD;  Location: [place];  Service: Vascular;  Laterality: N/A;  . LEFT HEART CATH AND CORONARY ANGIOGRAPHY N/A 09/02/2018   Procedure: LEFT HEART CATH AND CORONARY ANGIOGRAPHY;  Surgeon: Nelva Bush, MD;  Location: [place] CV LAB;  Service: Cardiovascular;  Laterality: N/A;  . LEFT HEART CATH AND CORONARY ANGIOGRAPHY N/A 05/14/2020   Procedure: LEFT HEART CATH AND CORONARY ANGIOGRAPHY;  Surgeon: Sherren Mocha, MD;  Location: [place] CV LAB;  Service: Cardiovascular;  Laterality: N/A;  . PERIPHERAL VASCULAR CATHETERIZATION N/A 05/13/2016   Procedure: Dialysis/Perma Catheter Insertion;  Surgeon: Serafina Mitchell, MD;  Location: [place] CV LAB;  Service: Cardiovascular;  Laterality: N/A;  . REVISION OF ARTERIOVENOUS GORETEX GRAFT Left 7/67/3419   Procedure: PLICATION OF ARTERIOVENOUS FISTULA LEFT ARM;  Surgeon: Elam Dutch, MD;  Location: [facility] OR;  Service: Vascular;  Laterality: Left;     Home Medications:  Prior to Admission medications   Medication Sig Start Date End Date Taking? Authorizing Provider  aspirin EC 81 MG EC tablet Take 1 tablet (81 mg total) by mouth daily. Swallow whole. 05/16/20  Yes Alma Friendly, MD  atorvastatin (LIPITOR) 80 MG tablet Take 1 tablet (80 mg total) by mouth daily. 03/16/20  Yes [name], [name], MD  clopidogrel (PLAVIX) 75 MG tablet Take 1 tablet by mouth once daily Patient taking differently: Take 75 mg by mouth daily.  05/17/20  Yes Meng, Isaac Laud, PA  diphenhydramine-acetaminophen (TYLENOL PM EXTRA STRENGTH) 25-500 MG TABS tablet Take 2 tablets by mouth at bedtime as needed (Pain, Sleep).  05/12/18  Yes [provider]  isosorbide mononitrate (IMDUR) 60 MG 24 hr  tablet Take 1 tablet (60 mg total) by mouth daily. 06/28/20  Yes [name], [name], MD  metoprolol succinate (TOPROL-XL) 25 MG 24 hr tablet Take 1 tablet (25 mg total) by mouth 4 (four) times a week. Take on Sunday, Monday, Wednesday, Friday 06/26/20  Yes [name], [name], MD  nitroGLYCERIN (NITROSTAT) 0.4 MG SL tablet DISSOLVE ONE TABLET UNDER THE TONGUE EVERY 5 MINUTES AS NEEDED FOR CHEST PAIN.  DO NOT EXCEED A TOTAL OF 3 DOSES IN 15 MINUTES Patient taking differently: Place 0.4 mg under the tongue every 5 (five) minutes as needed for chest pain.  09/04/20  Yes [name], [name], MD    Inpatient Medications: Scheduled Meds: . [START ON 09/23/2020] amLODipine  10 mg Oral Daily  . aspirin  81 mg Oral Daily  . atorvastatin  80 mg Oral Daily  . clopidogrel  75 mg Oral Daily  . heparin  5,000 Units Subcutaneous Q8H  . isosorbide mononitrate  30 mg Oral Once  . [START ON 09/23/2020]  isosorbide mononitrate  90 mg Oral Daily  . metoprolol succinate  25 mg Oral Once per day on Sun Tue Thu Sat  .  morphine injection  2 mg Intravenous Once  . ranolazine  500 mg Oral BID   Continuous Infusions: . sodium chloride    . sodium chloride     PRN Meds: acetaminophen, hydrALAZINE, labetalol, morphine injection, ondansetron (ZOFRAN) IV  Allergies:   No Known Allergies  Social History:   Social History   Socioeconomic History  . Marital status: Divorced    Spouse name: Not on file  . Number of children: Not on file  . Years of education: Not on file  . Highest education level: Not on file  Occupational History  . Not on file  Tobacco Use  . Smoking status: Former Smoker    Packs/day: 2.00    Years: 48.00    Pack years: 96.00    Types: Cigars    Quit date: 10/27/2017    Years since quitting: 2.9  . Smokeless tobacco: Former Systems developer    Types: Secondary school teacher  . Vaping Use: Never used  Substance and Sexual Activity  . Alcohol use: Yes    Comment: 1 L liquor  . Drug use: No  . Sexual activity: Not  Currently  Other Topics Concern  . Not on file  Social History Narrative  . Not on file   Social Determinants of Health   Financial Resource Strain:   . Difficulty of Paying Living Expenses: Not on file  Food Insecurity:   . Worried About Charity fundraiser in the Last Year: Not on file  . Ran Out of Food in the Last Year: Not on file  Transportation Needs:   . Lack of Transportation (Medical): Not on file  . Lack of Transportation (Non-Medical): Not on file  Physical Activity:   . Days of Exercise per Week: Not on file  . Minutes of Exercise per Session: Not on file  Stress:   . Feeling of Stress : Not on file  Social Connections:   . Frequency of Communication with Friends and Family: Not on file  . Frequency of Social Gatherings with Friends and Family: Not on file  . Attends Religious Services: Not on file  . Active Member of Clubs or Organizations: Not on file  . Attends Archivist Meetings: Not on file  . Marital Status: Not on file  Intimate Partner Violence:   . Fear of Current or Ex-Partner: Not on file  . Emotionally Abused: Not on file  . Physically Abused: Not on file  . Sexually Abused: Not on file    Family History:    Family History  Problem Relation Age of Onset  . Emphysema Mother   . Cirrhosis Father      ROS:  Please see the history of present illness.   All other ROS reviewed and negative.     Physical Exam/Data:   Vitals:   09/22/20 0739 09/22/20 0856 09/22/20 0931 09/22/20 1003  BP: (!) 185/89 (!) 200/86 126/78 140/77  Pulse: 63 74 72 78  Resp:    18  Temp:    97.8 F (36.6 C)  TempSrc:    Oral  SpO2:    99%  Weight:      Height:        Intake/Output Summary (Last 24 hours) at 09/22/2020 1039 Last data filed at 09/22/2020 0800 Gross per 24 hour  Intake 240 ml  Output --  Net 240 ml   Last 3 Weights 09/22/2020 09/21/2020 09/21/2020  Weight (lbs) 195 lb 9.6 oz 195 lb 9.6 oz 200 lb  Weight (kg) 88.724 kg 88.724 kg  90.719 kg     Body mass index is 26.53 kg/m.  General:  Well nourished, well developed, in no acute distress HEENT: normal Lymph: no adenopathy Neck: no JVD Endocrine:  No thryomegaly Vascular: No carotid bruits  Cardiac:  normal S1, S2; RRR; no murmur  Lungs:  clear to auscultation bilaterally, no wheezing, rhonchi or rales  Abd: soft, nontender, no hepatomegaly  Ext: no edema Musculoskeletal:  No deformities, BUE and BLE strength normal and equal Skin: warm and dry  Neuro:  CNs 2-12 intact, no focal abnormalities noted Psych:  Normal affect   EKG:  The EKG was personally reviewed and demonstrates:  Sinus rhythm HR 60-70s, PVCs Telemetry:  Telemetry was personally reviewed and demonstrates:  Sinus rhythm HR 69, TWI inferior and lateral leads   Relevant CV Studies:  Left heart cath 05/14/20: 1.  Severe multivessel coronary artery disease with severe calcific distal left main stenosis, severe diffuse ostial/proximal LAD stenosis, severe stenosis of the proximal left circumflex/first OM, and interval occlusion of the distal RCA with left-to-right collaterals 2.  Heavily calcified distal abdominal aorta and bilateral iliac arteries with mild diffuse calcified disease.  No severe iliac lesions identified.  Recommend ongoing medical therapy.  With total occlusion of the RCA and severe calcification of the distal left main and complex disease in the LAD and left circumflex, I think risk of protected PCI would be extremely high.  I also think his access is marginal for large bore support devices from the groin.   Echo 05/13/20: 1. Left ventricular ejection fraction, by estimation, is 55 to 60%. The  left ventricle has normal function. The left ventricle has no regional  wall motion abnormalities. There is moderate left ventricular hypertrophy.  Left ventricular diastolic  parameters are consistent with Grade I diastolic dysfunction (impaired  relaxation).  2. Right ventricular  systolic function is normal. The right ventricular  size is normal. There is normal pulmonary artery systolic pressure.  3. Left atrial size was mildly dilated.  4. The mitral valve is abnormal. Moderate mitral valve regurgitation.  5. The aortic valve is tricuspid. Aortic valve regurgitation is not  visualized. Mild aortic valve sclerosis is present, with no evidence of  aortic valve stenosis.    Laboratory Data:  High Sensitivity Troponin:   Recent Labs  Lab 09/21/20 2108 09/21/20 2327 09/22/20 0238 09/22/20 0435 09/22/20 0620  TROPONINIHS 45* 43* 44* 47* 53*     Chemistry Recent Labs  Lab 09/21/20 1708  NA 142  K 4.4  CL 95*  CO2 30  GLUCOSE 96  BUN 63*  CREATININE 11.00*  CALCIUM 9.5  GFRNONAA 5*  ANIONGAP 17*    Recent Labs  Lab 09/21/20 1708  PROT 7.4  ALBUMIN 3.2*  AST 12*  ALT 9  ALKPHOS 55  BILITOT 0.6   Hematology Recent Labs  Lab 09/21/20 1708  WBC 6.6  RBC 3.53*  HGB 11.3*  HCT 33.9*  MCV 96.0  MCH 32.0  MCHC 33.3  RDW 17.2*  PLT 204   BNPNo results for input(s): BNP, PROBNP in the last 168 hours.  DDimer No results for input(s): DDIMER in the last 168 hours.   Radiology/Studies:  DG Chest Port 1 View  Result Date: 09/21/2020 CLINICAL DATA:  Chest pain short of breath EXAM: PORTABLE CHEST 1  VIEW COMPARISON:  09/02/2020 FINDINGS: Mildly diminished lung volumes. Mild cardiomegaly. Negative for edema, focal opacity or pleural effusion. No pneumothorax. Aortic atherosclerosis IMPRESSION: Mild cardiomegaly. No edema or infiltrate. Electronically Signed   By: Donavan Foil M.D.   On: 09/21/2020 17:11     Assessment and Plan:   Chest pain Known multivessel disease not amenable to CABG or PCI - I do not think there is utility in repeating a heart catheterization at this time - CE low and flat - EKG with TWI inferior and lateral leads - primary team has added amlodipine with improvement in his BP - may only need to take amlodipine  on non-HD days - Trenda Corliss defer to nephrology - unable to add ranexa due to renal disease even on HD - I have also increased his imdur to 90 mg - continue ASA and plavix, 80 mg lipitor, BB   Hypertension Hypertensive urgency - pressure better controlled on 10 mg amlodipine - increase imdur to 90s mg   Hyperlipidemia 09/21/2020: Cholesterol 167; HDL 44; LDL Cholesterol 109; Triglycerides 69; VLDL 14 - continue 80 mg lipitor   HEAR Score (for undifferentiated chest pain):  HEAR Score: 5   For questions or updates, please contact [place] Please consult [email or web address] for contact info under    Signed, Ledora Bottcher, PA  09/22/2020 10:39 AM  I have seen and examined this patient with [place].  Agree with above, note added to reflect my findings.  On exam, RRR, no murmurs.  Patient mated to the hospital with chest pain.  He has a history of ESRD on dialysis, severe multivessel coronary artery disease not a surgical candidate.  He states that he had pain that radiated to his jaw, relieved by nitroglycerin.  He was quite hypertensive in the emergency room.  He does state that his blood pressure goes down with dialysis.  Due to his chest pain and hypertension, we Vernel Donlan increase his Imdur today.  Unfortunately, he cannot tolerate Ranexa due to being on dialysis.  He does need strict control of his blood pressure which likely is contributing.  His high-sensitivity troponin is elevated, but this is likely complicated by his end-stage renal disease.     Milen Lengacher M. Krystle Oberman MD 09/22/2020 11:31 AM

## 2020-09-22 NOTE — Assessment & Plan Note (Addendum)
-  Blood pressure uncontrolled on admission likely contributing to chest pain -Amlodipine increased to 10 mg -Imdur increased to 90 mg -Continue Toprol

## 2020-09-22 NOTE — Progress Notes (Signed)
PT c/o 7/10 chest pain. MD notified. EKG, Tropoin, Nitro x 3 ordered and completed.

## 2020-09-23 DIAGNOSIS — I25119 Atherosclerotic heart disease of native coronary artery with unspecified angina pectoris: Secondary | ICD-10-CM | POA: Diagnosis not present

## 2020-09-23 DIAGNOSIS — R079 Chest pain, unspecified: Secondary | ICD-10-CM | POA: Diagnosis not present

## 2020-09-23 DIAGNOSIS — I5033 Acute on chronic diastolic (congestive) heart failure: Secondary | ICD-10-CM | POA: Diagnosis not present

## 2020-09-23 DIAGNOSIS — N186 End stage renal disease: Secondary | ICD-10-CM | POA: Diagnosis not present

## 2020-09-23 LAB — CBC WITH DIFFERENTIAL/PLATELET
Abs Immature Granulocytes: 0.01 10*3/uL (ref 0.00–0.07)
Basophils Absolute: 0.1 10*3/uL (ref 0.0–0.1)
Basophils Relative: 1 %
Eosinophils Absolute: 0.7 10*3/uL — ABNORMAL HIGH (ref 0.0–0.5)
Eosinophils Relative: 12 %
HCT: 32.8 % — ABNORMAL LOW (ref 39.0–52.0)
Hemoglobin: 11.4 g/dL — ABNORMAL LOW (ref 13.0–17.0)
Immature Granulocytes: 0 %
Lymphocytes Relative: 26 %
Lymphs Abs: 1.6 10*3/uL (ref 0.7–4.0)
MCH: 32 pg (ref 26.0–34.0)
MCHC: 34.8 g/dL (ref 30.0–36.0)
MCV: 92.1 fL (ref 80.0–100.0)
Monocytes Absolute: 0.9 10*3/uL (ref 0.1–1.0)
Monocytes Relative: 14 %
Neutro Abs: 2.9 10*3/uL (ref 1.7–7.7)
Neutrophils Relative %: 47 %
Platelets: 192 10*3/uL (ref 150–400)
RBC: 3.56 MIL/uL — ABNORMAL LOW (ref 4.22–5.81)
RDW: 17.2 % — ABNORMAL HIGH (ref 11.5–15.5)
WBC: 6.1 10*3/uL (ref 4.0–10.5)
nRBC: 0 % (ref 0.0–0.2)

## 2020-09-23 LAB — BASIC METABOLIC PANEL
Anion gap: 16 — ABNORMAL HIGH (ref 5–15)
BUN: 34 mg/dL — ABNORMAL HIGH (ref 8–23)
CO2: 26 mmol/L (ref 22–32)
Calcium: 8.3 mg/dL — ABNORMAL LOW (ref 8.9–10.3)
Chloride: 97 mmol/L — ABNORMAL LOW (ref 98–111)
Creatinine, Ser: 8.82 mg/dL — ABNORMAL HIGH (ref 0.61–1.24)
GFR, Estimated: 6 mL/min — ABNORMAL LOW (ref >60)
Glucose, Bld: 104 mg/dL — ABNORMAL HIGH (ref 70–99)
Potassium: 4 mmol/L (ref 3.5–5.1)
Sodium: 139 mmol/L (ref 135–145)

## 2020-09-23 LAB — MAGNESIUM: Magnesium: 1.8 mg/dL (ref 1.7–2.4)

## 2020-09-23 MED ORDER — AMLODIPINE BESYLATE 10 MG PO TABS
10.0000 mg | ORAL_TABLET | Freq: Every day | ORAL | 3 refills | Status: DC
Start: 2020-09-24 — End: 2020-10-09

## 2020-09-23 MED ORDER — ISOSORBIDE MONONITRATE ER 30 MG PO TB24
90.0000 mg | ORAL_TABLET | Freq: Every day | ORAL | 3 refills | Status: DC
Start: 2020-09-24 — End: 2020-10-09

## 2020-09-23 NOTE — Progress Notes (Signed)
Progress Note  Patient Name: Kenneth [name] Date of Encounter: 09/23/2020  [facility] Cardiologist: Peter [name], MD   Subjective   Feeling much improved without chest pain.  Blood pressure is better controlled.  Did get dialysis yesterday without complication.  Inpatient Medications    Scheduled Meds: . amLODipine  10 mg Oral Daily  . aspirin  81 mg Oral Daily  . atorvastatin  80 mg Oral Daily  . Chlorhexidine Gluconate Cloth  6 each Topical Daily  . clopidogrel  75 mg Oral Daily  . heparin  5,000 Units Subcutaneous Q8H  . isosorbide mononitrate  90 mg Oral Daily  . metoprolol succinate  25 mg Oral Once per day on Sun Tue Thu Sat  .  morphine injection  2 mg Intravenous Once   Continuous Infusions: . sodium chloride    . sodium chloride     PRN Meds: acetaminophen, hydrALAZINE, labetalol, morphine injection, ondansetron (ZOFRAN) IV   Vital Signs    Vitals:   09/22/20 1619 09/22/20 1654 09/22/20 2004 09/23/20 0424  BP: 113/61 98/68 (!) 145/73 127/60  Pulse: 68 71 69 77  Resp: 17 18 17 17   Temp: 98.4 F (36.9 C) 97.7 F (36.5 C) 98.3 F (36.8 C) 98.5 F (36.9 C)  TempSrc: Oral Oral Oral Oral  SpO2: 99% 94% 95% 98%  Weight: 86.5 kg   86.9 kg  Height:        Intake/Output Summary (Last 24 hours) at 09/23/2020 0755 Last data filed at 09/22/2020 1619 Gross per 24 hour  Intake 240 ml  Output 3400 ml  Net -3160 ml   Last 3 Weights 09/23/2020 09/22/2020 09/22/2020  Weight (lbs) 191 lb 9.6 oz 190 lb 11.2 oz 196 lb 3.4 oz  Weight (kg) 86.909 kg 86.5 kg 89 kg      Telemetry    Sinus rhythm- Personally Reviewed  ECG    None new- Personally Reviewed  Physical Exam   GEN: No acute distress.   Neck: No JVD Cardiac: RRR, no murmurs, rubs, or gallops.  Respiratory: Clear to auscultation bilaterally. GI: Soft, nontender, non-distended  MS: No edema; No deformity. Neuro:  Nonfocal  Psych: Normal affect   Labs    High Sensitivity Troponin:     Recent Labs  Lab 09/21/20 2108 09/21/20 2327 09/22/20 0238 09/22/20 0435 09/22/20 0620  TROPONINIHS 45* 43* 44* 47* 53*      Chemistry Recent Labs  Lab 09/21/20 1708 09/23/20 0418  NA 142 139  K 4.4 4.0  CL 95* 97*  CO2 30 26  GLUCOSE 96 104*  BUN 63* 34*  CREATININE 11.00* 8.82*  CALCIUM 9.5 8.3*  PROT 7.4  --   ALBUMIN 3.2*  --   AST 12*  --   ALT 9  --   ALKPHOS 55  --   BILITOT 0.6  --   GFRNONAA 5* 6*  ANIONGAP 17* 16*     Hematology Recent Labs  Lab 09/21/20 1708 09/23/20 0418  WBC 6.6 6.1  RBC 3.53* 3.56*  HGB 11.3* 11.4*  HCT 33.9* 32.8*  MCV 96.0 92.1  MCH 32.0 32.0  MCHC 33.3 34.8  RDW 17.2* 17.2*  PLT 204 192    BNPNo results for input(s): BNP, PROBNP in the last 168 hours.   DDimer No results for input(s): DDIMER in the last 168 hours.   Radiology    DG Chest Port 1 View  Result Date: 09/21/2020 CLINICAL DATA:  Chest pain short of breath EXAM: PORTABLE  CHEST 1 VIEW COMPARISON:  09/02/2020 FINDINGS: Mildly diminished lung volumes. Mild cardiomegaly. Negative for edema, focal opacity or pleural effusion. No pneumothorax. Aortic atherosclerosis IMPRESSION: Mild cardiomegaly. No edema or infiltrate. Electronically Signed   By: Donavan Foil M.D.   On: 09/21/2020 17:11    Cardiac Studies   Left heart cath 05/14/20: 1. Severe multivessel coronary artery disease with severe calcific distal left main stenosis, severe diffuse ostial/proximal LAD stenosis, severe stenosis of the proximal left circumflex/first OM, and interval occlusion of the distal RCA with left-to-right collaterals 2. Heavily calcified distal abdominal aorta and bilateral iliac arteries with mild diffuse calcified disease. No severe iliac lesions identified.  Recommend ongoing medical therapy. With total occlusion of the RCA and severe calcification of the distal left main and complex disease in the LAD and left circumflex, I think risk of protected PCI would be extremely  high. I also think his access is marginal for large bore support devices from the groin.   Echo 05/13/20: 1. Left ventricular ejection fraction, by estimation, is 55 to 60%. The  left ventricle has normal function. The left ventricle has no regional  wall motion abnormalities. There is moderate left ventricular hypertrophy.  Left ventricular diastolic  parameters are consistent with Grade I diastolic dysfunction (impaired  relaxation).  2. Right ventricular systolic function is normal. The right ventricular  size is normal. There is normal pulmonary artery systolic pressure.  3. Left atrial size was mildly dilated.  4. The mitral valve is abnormal. Moderate mitral valve regurgitation.  5. The aortic valve is tricuspid. Aortic valve regurgitation is not  visualized. Mild aortic valve sclerosis is present, with no evidence of  aortic valve stenosis.   Patient Profile     64 y.o. male with a history of multivessel coronary artery disease medically managed, chronic systolic and diastolic heart failure, alcohol abuse, atrial fibrillation/flutter, end-stage renal disease, hypertension, nonsustained VT presented to the hospital with chest pain.  Assessment & Plan    1.  Chest pain: Significant coronary artery disease not amenable to CABG or PCI.  No utility in repeat catheterization.  His troponin was not elevated.  Primary team is added amlodipine.  Also increased his Imdur.  At this point, it is likely that his chest pain was caused by his hypertensive urgency.  His blood pressure is now better controlled.  No further chest pain work-up at this time.  2.  Hypertension: Blood pressure better controlled on 10 mg of amlodipine and 90 mg of Imdur.  These can be prescribed for outpatient meds.  3.  Hyperlipidemia: LDL of 109.  Continue 80 mg of Lipitor.      For questions or updates, please contact [place] Please consult [email or web address] for contact info under         Signed, Tevan Marian Meredith Leeds, MD  09/23/2020, 7:55 AM

## 2020-09-23 NOTE — Progress Notes (Signed)
Notified per patient request daughter Danae Chen that patient will be d/c today.

## 2020-09-23 NOTE — Progress Notes (Signed)
  [facility] Progress Note   Assessment/ Plan:   Dialysis Orders:  TTS at [facility] 4hr, 400/800, EDW 85kg, 2K/2Ca, AVF, heparin 2400 - Sensipar 30mg  PO q HD - Calcitriol 2.41mcg PO q HD - No ESA  Assessment/Plan: 1.  Chest pain, angina: Trop trend flat, cardiology consulted - added amlod, ranexa, and increased isosorbide dose. No LHC planned. 2.  ESRD: Continue HD per usual TTS sched - HD 11/27, next planned for 11/30. 3.  Hypertension/volume: BP now controlled, about 3kg above EDW - will plan that as his goal. 4.  Anemia: Hgb 11.3 - no ESA needed. 5.  Metabolic bone disease: Ca ok, Phos pending - resume home binders. 6. Dispo: ok to d/c from renal standpoint  Subjective:    Had HD yesterday without incident.     Objective:   BP 138/66 (BP Location: Right Arm)   Pulse 75   Temp 98.2 F (36.8 C)   Resp 18   Ht 6' (1.829 m)   Wt 86.9 kg   SpO2 96%   BMI 25.99 kg/m   Physical Exam: Gen: NAD, sitting in bed CVS: RRR Resp: clear  Abd: soft Ext: no LE edema ACCESS: LUE AVF + T/B  Labs: BMET Recent Labs  Lab 09/21/20 1708 09/23/20 0418  NA 142 139  K 4.4 4.0  CL 95* 97*  CO2 30 26  GLUCOSE 96 104*  BUN 63* 34*  CREATININE 11.00* 8.82*  CALCIUM 9.5 8.3*   CBC Recent Labs  Lab 09/21/20 1708 09/23/20 0418  WBC 6.6 6.1  NEUTROABS  --  2.9  HGB 11.3* 11.4*  HCT 33.9* 32.8*  MCV 96.0 92.1  PLT 204 192      Medications:    . amLODipine  10 mg Oral Daily  . aspirin  81 mg Oral Daily  . atorvastatin  80 mg Oral Daily  . Chlorhexidine Gluconate Cloth  6 each Topical Daily  . clopidogrel  75 mg Oral Daily  . heparin  5,000 Units Subcutaneous Q8H  . isosorbide mononitrate  90 mg Oral Daily  . metoprolol succinate  25 mg Oral Once per day on Sun Tue Thu Sat  .  morphine injection  2 mg Intravenous Once    Madelon Lips, MD 09/23/2020, 10:54 AM

## 2020-09-23 NOTE — Discharge Summary (Signed)
Physician Discharge Summary   Kenneth [name]:[id number] DOB: 16-May-1956 DOA: 09/21/2020  PCP: [name], [name], MD  Admit date: 09/21/2020 Discharge date: 09/23/2020  Admitted From: home Disposition:  home Discharging physician: Dwyane Dee, MD  Recommendations for Outpatient Follow-up:  1. If hypotensive, may need to hold BP meds on days of HD (or reduce dose)   Patient discharged to Home in Discharge Condition: stable CODE STATUS: Full Diet recommendation:  Diet Orders (From admission, onward)    Start     Ordered   09/21/20 2017  Diet renal with fluid restriction Fluid restriction: 1200 mL Fluid; Room service appropriate? Yes; Fluid consistency: Thin  Diet effective now       Question Answer Comment  Fluid restriction: 1200 mL Fluid   Room service appropriate? Yes   Fluid consistency: Thin      09/21/20 2016          Hospital Course: Kenneth [name] is a 64 yo male with PMH severe multivessel CAD (not a CABG candidate due to noncompliance concern and poor revascularization targets), ESRD on HD, HTN, Gr 1 DD who presented to the ER with chest pain.  He had missed his Friday session due to the holidays and was last dialyzed on Wednesday.  He was found to be severely hypertensive SBP around 200 in the ER. There was some concern for possible ACS.  He underwent EKG and troponin trending.  He had T wave inversions noted in lateral leads that had been present on prior as well.  Troponins indeterminate in setting of renal failure.  Nephrology and cardiology were consulted on admission as well.  He was taken for dialysis on 09/22/2020. He had a recent heart cath in July 2021.  There was felt to be no need for repeat cath at this time and tighter blood pressure control was recommended which was likely contributing to his chest pain with underlying severe CAD. He was started on amlodipine and his Imdur was also increased.  He does become hypotensive sometimes during dialysis, therefore  amlodipine may need to be possibly held on dialysis days if becomes significantly hypotensive. He did tolerate medications well and his chest pain resolved.  He was continued on increased dose of Imdur and amlodipine at discharge.   * Chest pain-resolved as of 09/23/2020 -SBP around 200 on admission.  With underlying severe CAD being managed medically, this is likely chest pain induced by severe hypertension -Amlodipine increased to 10mg  this morning -Cardiology has also increased Imdur to 90 mg daily - CP has dramatically improved with these changes; appreciate cardiology assistance -Remained chest pain-free prior to discharge  Coronary artery disease involving native heart with angina pectoris [facility]) -Recent cath performed July 2021.  No utility in repeat cath at this time -Remains poor candidate for CABG due to some noncompliance and poor revascularization targets -Plan is to continue on medical management and controlling blood pressure -Continue aspirin, Lipitor, Plavix, Toprol  Acute on chronic diastolic CHF (congestive heart failure) ([place]) -Last echo July 2021, EF 55 to 65%, grade 1 diastolic dysfunction, moderate LVH -Continue volume removal with dialysis  ESRD on hemodialysis [facility]) - MWF schedule; last session was on Wednesday -Undergoing dialysis today -Appreciate nephrology assistance -Continue on regular schedule at discharge  Essential hypertension -Blood pressure uncontrolled on admission likely contributing to chest pain -Amlodipine increased to 10 mg -Imdur increased to 90 mg -Continue Toprol    The patient's chronic medical conditions were treated accordingly per the patient's home medication regimen  except as noted.  On day of discharge, patient was felt deemed stable for discharge. Patient/family member advised to call PCP or come back to ER if needed.   Principal Diagnosis: Chest pain  Discharge Diagnoses: Active Hospital Problems   Diagnosis Date Noted  .  Coronary artery disease involving native heart with angina pectoris [facility])     Priority: High  . Acute on chronic diastolic CHF (congestive heart failure) ([place])     Priority: Medium  . ESRD on hemodialysis ([place]) 10/08/2016    Priority: Medium  . Essential hypertension 10/09/2011    Priority: Low  . DNR (do not resuscitate) discussion     Resolved Hospital Problems   Diagnosis Date Noted Date Resolved  . Chest pain 09/21/2020 09/23/2020    Priority: High    Discharge Instructions    Increase activity slowly   Complete by: As directed      Allergies as of 09/23/2020   No Known Allergies     Medication List    TAKE these medications   amLODipine 10 MG tablet Commonly known as: NORVASC Take 1 tablet (10 mg total) by mouth daily. Start taking on: September 24, 2020   aspirin 81 MG EC tablet Take 1 tablet (81 mg total) by mouth daily. Swallow whole.   atorvastatin 80 MG tablet Commonly known as: LIPITOR Take 1 tablet (80 mg total) by mouth daily.   clopidogrel 75 MG tablet Commonly known as: PLAVIX Take 1 tablet by mouth once daily   isosorbide mononitrate 30 MG 24 hr tablet Commonly known as: IMDUR Take 3 tablets (90 mg total) by mouth daily. Start taking on: September 24, 2020 What changed:   medication strength  how much to take   metoprolol succinate 25 MG 24 hr tablet Commonly known as: TOPROL-XL Take 1 tablet (25 mg total) by mouth 4 (four) times a week. Take on Sunday, Monday, Wednesday, Friday   nitroGLYCERIN 0.4 MG SL tablet Commonly known as: NITROSTAT DISSOLVE ONE TABLET UNDER THE TONGUE EVERY 5 MINUTES AS NEEDED FOR CHEST PAIN.  DO NOT EXCEED A TOTAL OF 3 DOSES IN 15 MINUTES What changed: See the new instructions.   Tylenol PM Extra Strength 25-500 MG Tabs tablet Generic drug: diphenhydramine-acetaminophen Take 2 tablets by mouth at bedtime as needed (Pain, Sleep).       No Known Allergies  Consultations: Cardiology Nephrology  Discharge  Exam: BP 139/71 (BP Location: Right Arm)   Pulse 71   Temp (!) 97.5 F (36.4 C) (Oral)   Resp 18   Ht 6' (1.829 m)   Wt 86.9 kg   SpO2 94%   BMI 25.99 kg/m  General appearance: Adult man with slightly slowed mentation sitting up in bed in no distress Head: Normocephalic, without obvious abnormality, atraumatic Eyes: EOMI Lungs: clear to auscultation bilaterally Heart: regular rate and rhythm and S1, S2 normal Abdomen: normal findings: bowel sounds normal and soft, non-tender Extremities: no edema Skin: mobility and turgor normal Neurologic: Grossly normal  The results of significant diagnostics from this hospitalization (including imaging, microbiology, ancillary and laboratory) are listed below for reference.   Microbiology: Recent Results (from the past 240 hour(s))  Resp Panel by RT-PCR (Flu A&B, Covid) Nasopharyngeal Swab     Status: None   Collection Time: 09/21/20  9:19 PM   Specimen: Nasopharyngeal Swab; Nasopharyngeal(NP) swabs in vial transport medium  Result Value Ref Range Status   SARS Coronavirus 2 by RT PCR NEGATIVE NEGATIVE Final    Comment: (NOTE)  SARS-CoV-2 target nucleic acids are NOT DETECTED.  The SARS-CoV-2 RNA is generally detectable in upper respiratory specimens during the acute phase of infection. The lowest concentration of SARS-CoV-2 viral copies this assay can detect is 138 copies/mL. A negative result does not preclude SARS-Cov-2 infection and should not be used as the sole basis for treatment or other patient management decisions. A negative result may occur with  improper specimen collection/handling, submission of specimen other than nasopharyngeal swab, presence of viral mutation(s) within the areas targeted by this assay, and inadequate number of viral copies(<138 copies/mL). A negative result must be combined with clinical observations, patient history, and epidemiological information. The expected result is Negative.  Fact Sheet for  Patients:  EntrepreneurPulse.com.au  Fact Sheet for Healthcare Providers:  IncredibleEmployment.be  This test is no t yet approved or cleared by the [place] FDA and  has been authorized for detection and/or diagnosis of SARS-CoV-2 by FDA under an Emergency Use Authorization (EUA). This EUA will remain  in effect (meaning this test can be used) for the duration of the COVID-19 declaration under Section 564(b)(1) of the Act, 21 U.S.C.section 360bbb-3(b)(1), unless the authorization is terminated  or revoked sooner.       Influenza A by PCR NEGATIVE NEGATIVE Final   Influenza B by PCR NEGATIVE NEGATIVE Final    Comment: (NOTE) The Xpert Xpress SARS-CoV-2/FLU/RSV plus assay is intended as an aid in the diagnosis of influenza from Nasopharyngeal swab specimens and should not be used as a sole basis for treatment. Nasal washings and aspirates are unacceptable for Xpert Xpress SARS-CoV-2/FLU/RSV testing.  Fact Sheet for Patients: EntrepreneurPulse.com.au  Fact Sheet for Healthcare Providers: IncredibleEmployment.be  This test is not yet approved or cleared by the [place] FDA and has been authorized for detection and/or diagnosis of SARS-CoV-2 by FDA under an Emergency Use Authorization (EUA). This EUA will remain in effect (meaning this test can be used) for the duration of the COVID-19 declaration under Section 564(b)(1) of the Act, 21 U.S.C. section 360bbb-3(b)(1), unless the authorization is terminated or revoked.  Performed at [facility], [place] [address], [place], [place] [address]   MRSA PCR Screening     Status: None   Collection Time: 09/21/20 10:57 PM   Specimen: Nasal Mucosa; Nasopharyngeal  Result Value Ref Range Status   MRSA by PCR NEGATIVE NEGATIVE Final    Comment:        The GeneXpert MRSA Assay (FDA approved for NASAL specimens only), is one component of a comprehensive  MRSA colonization surveillance program. It is not intended to diagnose MRSA infection nor to guide or monitor treatment for MRSA infections. Performed at [facility], [place] [address] [place], [place] [address]      Labs: BNP (last 3 results) No results for input(s): BNP in the last 8760 hours. Basic Metabolic Panel: Recent Labs  Lab 09/21/20 1708 09/23/20 0418  NA 142 139  K 4.4 4.0  CL 95* 97*  CO2 30 26  GLUCOSE 96 104*  BUN 63* 34*  CREATININE 11.00* 8.82*  CALCIUM 9.5 8.3*  MG  --  1.8   Liver Function Tests: Recent Labs  Lab 09/21/20 1708  AST 12*  ALT 9  ALKPHOS 55  BILITOT 0.6  PROT 7.4  ALBUMIN 3.2*   No results for input(s): LIPASE, AMYLASE in the last 168 hours. No results for input(s): AMMONIA in the last 168 hours. CBC: Recent Labs  Lab 09/21/20 1708 09/23/20 0418  WBC  6.6 6.1  NEUTROABS  --  2.9  HGB 11.3* 11.4*  HCT 33.9* 32.8*  MCV 96.0 92.1  PLT 204 192   Cardiac Enzymes: No results for input(s): CKTOTAL, CKMB, CKMBINDEX, TROPONINI in the last 168 hours. BNP: Invalid input(s): POCBNP CBG: No results for input(s): GLUCAP in the last 168 hours. D-Dimer No results for input(s): DDIMER in the last 72 hours. Hgb A1c No results for input(s): HGBA1C in the last 72 hours. Lipid Profile Recent Labs    09/21/20 2108  CHOL 167  HDL 44  LDLCALC 109*  TRIG 69  CHOLHDL 3.8   Thyroid function studies No results for input(s): TSH, T4TOTAL, T3FREE, THYROIDAB in the last 72 hours.  Invalid input(s): FREET3 Anemia work up No results for input(s): VITAMINB12, FOLATE, FERRITIN, TIBC, IRON, RETICCTPCT in the last 72 hours. Urinalysis    Component Value Date/Time   COLORURINE YELLOW 05/12/2016 0834   APPEARANCEUR CLOUDY (A) 05/12/2016 0834   LABSPEC 1.014 05/12/2016 0834   PHURINE 5.0 05/12/2016 0834   GLUCOSEU NEGATIVE 05/12/2016 0834   HGBUR MODERATE (A) 05/12/2016 0834   BILIRUBINUR NEGATIVE 05/12/2016 0834   KETONESUR  NEGATIVE 05/12/2016 0834   PROTEINUR 100 (A) 05/12/2016 0834   UROBILINOGEN 0.2 12/09/2013 0013   NITRITE NEGATIVE 05/12/2016 0834   LEUKOCYTESUR NEGATIVE 05/12/2016 0834   Sepsis Labs Invalid input(s): PROCALCITONIN,  WBC,  LACTICIDVEN Microbiology Recent Results (from the past 240 hour(s))  Resp Panel by RT-PCR (Flu A&B, Covid) Nasopharyngeal Swab     Status: None   Collection Time: 09/21/20  9:19 PM   Specimen: Nasopharyngeal Swab; Nasopharyngeal(NP) swabs in vial transport medium  Result Value Ref Range Status   SARS Coronavirus 2 by RT PCR NEGATIVE NEGATIVE Final    Comment: (NOTE) SARS-CoV-2 target nucleic acids are NOT DETECTED.  The SARS-CoV-2 RNA is generally detectable in upper respiratory specimens during the acute phase of infection. The lowest concentration of SARS-CoV-2 viral copies this assay can detect is 138 copies/mL. A negative result does not preclude SARS-Cov-2 infection and should not be used as the sole basis for treatment or other patient management decisions. A negative result may occur with  improper specimen collection/handling, submission of specimen other than nasopharyngeal swab, presence of viral mutation(s) within the areas targeted by this assay, and inadequate number of viral copies(<138 copies/mL). A negative result must be combined with clinical observations, patient history, and epidemiological information. The expected result is Negative.  Fact Sheet for Patients:  EntrepreneurPulse.com.au  Fact Sheet for Healthcare Providers:  IncredibleEmployment.be  This test is no t yet approved or cleared by the [place] FDA and  has been authorized for detection and/or diagnosis of SARS-CoV-2 by FDA under an Emergency Use Authorization (EUA). This EUA will remain  in effect (meaning this test can be used) for the duration of the COVID-19 declaration under Section 564(b)(1) of the Act, 21 U.S.C.section  360bbb-3(b)(1), unless the authorization is terminated  or revoked sooner.       Influenza A by PCR NEGATIVE NEGATIVE Final   Influenza B by PCR NEGATIVE NEGATIVE Final    Comment: (NOTE) The Xpert Xpress SARS-CoV-2/FLU/RSV plus assay is intended as an aid in the diagnosis of influenza from Nasopharyngeal swab specimens and should not be used as a sole basis for treatment. Nasal washings and aspirates are unacceptable for Xpert Xpress SARS-CoV-2/FLU/RSV testing.  Fact Sheet for Patients: EntrepreneurPulse.com.au  Fact Sheet for Healthcare Providers: IncredibleEmployment.be  This test is not yet approved or cleared by the [place]  States FDA and has been authorized for detection and/or diagnosis of SARS-CoV-2 by FDA under an Emergency Use Authorization (EUA). This EUA will remain in effect (meaning this test can be used) for the duration of the COVID-19 declaration under Section 564(b)(1) of the Act, 21 U.S.C. section 360bbb-3(b)(1), unless the authorization is terminated or revoked.  Performed at [facility], [place] [address], [place], [place] [address]   MRSA PCR Screening     Status: None   Collection Time: 09/21/20 10:57 PM   Specimen: Nasal Mucosa; Nasopharyngeal  Result Value Ref Range Status   MRSA by PCR NEGATIVE NEGATIVE Final    Comment:        The GeneXpert MRSA Assay (FDA approved for NASAL specimens only), is one component of a comprehensive MRSA colonization surveillance program. It is not intended to diagnose MRSA infection nor to guide or monitor treatment for MRSA infections. Performed at [facility], [place] [address] [place],  [address]     Procedures/Studies: DG Chest Port 1 View  Result Date: 09/21/2020 CLINICAL DATA:  Chest pain short of breath EXAM: PORTABLE CHEST 1 VIEW COMPARISON:  09/02/2020 FINDINGS: Mildly diminished lung volumes. Mild cardiomegaly. Negative for edema, focal opacity or  pleural effusion. No pneumothorax. Aortic atherosclerosis IMPRESSION: Mild cardiomegaly. No edema or infiltrate. Electronically Signed   By: Donavan Foil M.D.   On: 09/21/2020 17:11   DG Chest Portable 1 View  Result Date: 09/02/2020 CLINICAL DATA:  Chest pain EXAM: PORTABLE CHEST 1 VIEW COMPARISON:  May 12, 2020 FINDINGS: There is cardiomegaly. Aortic calcifications are noted. There is no pneumothorax. No large pleural effusion. Chronic prominent interstitial lung markings are again noted. There is no definite acute osseous abnormality. IMPRESSION: No active disease. Electronically Signed   By: Constance Holster M.D.   On: 09/02/2020 23:08     Time coordinating discharge: Over 30 minutes    Dwyane Dee, MD  [facility] 09/23/2020, 2:37 PM

## 2020-09-23 NOTE — Progress Notes (Signed)
Has d/c orders but pt stated he does need taxi voucher for transportation. Contacted CM.

## 2020-09-23 NOTE — Progress Notes (Signed)
Patient discharged: Home with family  Via: Wheelchair via [employer]   Discharge paperwork given: to patient and family  Reviewed with teach back  IV and telemetry disconnected  Belongings given to patient

## 2020-09-24 ENCOUNTER — Observation Stay (HOSPITAL_COMMUNITY)
Admission: EM | Admit: 2020-09-24 | Discharge: 2020-09-25 | Disposition: A | Discharge status: Home / Self Care | Payer: Medicare Other | Attending: Internal Medicine | Admitting: Internal Medicine

## 2020-09-24 ENCOUNTER — Other Ambulatory Visit: Payer: Self-pay

## 2020-09-24 ENCOUNTER — Emergency Department (HOSPITAL_COMMUNITY): Payer: Medicare Other

## 2020-09-24 ENCOUNTER — Encounter (HOSPITAL_COMMUNITY): Payer: Self-pay | Admitting: Internal Medicine

## 2020-09-24 DIAGNOSIS — I5042 Chronic combined systolic (congestive) and diastolic (congestive) heart failure: Secondary | ICD-10-CM

## 2020-09-24 DIAGNOSIS — I132 Hypertensive heart and chronic kidney disease with heart failure and with stage 5 chronic kidney disease, or end stage renal disease: Secondary | ICD-10-CM | POA: Insufficient documentation

## 2020-09-24 DIAGNOSIS — R079 Chest pain, unspecified: Secondary | ICD-10-CM | POA: Diagnosis not present

## 2020-09-24 DIAGNOSIS — R778 Other specified abnormalities of plasma proteins: Secondary | ICD-10-CM | POA: Insufficient documentation

## 2020-09-24 DIAGNOSIS — N186 End stage renal disease: Secondary | ICD-10-CM | POA: Diagnosis not present

## 2020-09-24 DIAGNOSIS — Z20822 Contact with and (suspected) exposure to covid-19: Secondary | ICD-10-CM | POA: Diagnosis not present

## 2020-09-24 DIAGNOSIS — I5032 Chronic diastolic (congestive) heart failure: Secondary | ICD-10-CM | POA: Diagnosis present

## 2020-09-24 DIAGNOSIS — Z955 Presence of coronary angioplasty implant and graft: Secondary | ICD-10-CM | POA: Insufficient documentation

## 2020-09-24 DIAGNOSIS — Z79899 Other long term (current) drug therapy: Secondary | ICD-10-CM | POA: Insufficient documentation

## 2020-09-24 DIAGNOSIS — I2089 Other forms of angina pectoris: Secondary | ICD-10-CM | POA: Diagnosis present

## 2020-09-24 DIAGNOSIS — E785 Hyperlipidemia, unspecified: Secondary | ICD-10-CM | POA: Diagnosis not present

## 2020-09-24 DIAGNOSIS — Z87891 Personal history of nicotine dependence: Secondary | ICD-10-CM | POA: Diagnosis not present

## 2020-09-24 DIAGNOSIS — I25110 Atherosclerotic heart disease of native coronary artery with unstable angina pectoris: Secondary | ICD-10-CM | POA: Insufficient documentation

## 2020-09-24 DIAGNOSIS — I25118 Atherosclerotic heart disease of native coronary artery with other forms of angina pectoris: Secondary | ICD-10-CM

## 2020-09-24 DIAGNOSIS — Z7982 Long term (current) use of aspirin: Secondary | ICD-10-CM | POA: Diagnosis not present

## 2020-09-24 DIAGNOSIS — Z992 Dependence on renal dialysis: Secondary | ICD-10-CM

## 2020-09-24 DIAGNOSIS — I10 Essential (primary) hypertension: Secondary | ICD-10-CM | POA: Diagnosis present

## 2020-09-24 DIAGNOSIS — I208 Other forms of angina pectoris: Secondary | ICD-10-CM | POA: Diagnosis present

## 2020-09-24 LAB — CBC
HCT: 36.3 % — ABNORMAL LOW (ref 39.0–52.0)
Hemoglobin: 11.9 g/dL — ABNORMAL LOW (ref 13.0–17.0)
MCH: 31.5 pg (ref 26.0–34.0)
MCHC: 32.8 g/dL (ref 30.0–36.0)
MCV: 96 fL (ref 80.0–100.0)
Platelets: 212 10*3/uL (ref 150–400)
RBC: 3.78 MIL/uL — ABNORMAL LOW (ref 4.22–5.81)
RDW: 17.3 % — ABNORMAL HIGH (ref 11.5–15.5)
WBC: 6.7 10*3/uL (ref 4.0–10.5)
nRBC: 0 % (ref 0.0–0.2)

## 2020-09-24 LAB — BASIC METABOLIC PANEL
Anion gap: 18 — ABNORMAL HIGH (ref 5–15)
BUN: 53 mg/dL — ABNORMAL HIGH (ref 8–23)
CO2: 24 mmol/L (ref 22–32)
Calcium: 9.1 mg/dL (ref 8.9–10.3)
Chloride: 97 mmol/L — ABNORMAL LOW (ref 98–111)
Creatinine, Ser: 11.77 mg/dL — ABNORMAL HIGH (ref 0.61–1.24)
GFR, Estimated: 4 mL/min — ABNORMAL LOW (ref >60)
Glucose, Bld: 90 mg/dL (ref 70–99)
Potassium: 5.2 mmol/L — ABNORMAL HIGH (ref 3.5–5.1)
Sodium: 139 mmol/L (ref 135–145)

## 2020-09-24 LAB — RESP PANEL BY RT-PCR (FLU A&B, COVID) ARPGX2
Influenza A by PCR: NEGATIVE
Influenza B by PCR: NEGATIVE
SARS Coronavirus 2 by RT PCR: NEGATIVE

## 2020-09-24 LAB — TROPONIN I (HIGH SENSITIVITY)
Troponin I (High Sensitivity): 112 ng/L (ref <18)
Troponin I (High Sensitivity): 116 ng/L (ref <18)

## 2020-09-24 MED ORDER — ONDANSETRON HCL 4 MG/2ML IJ SOLN: 4.0000 mg | Freq: Four times a day (QID) | INTRAMUSCULAR | Status: DC | PRN

## 2020-09-24 MED ORDER — CALCIUM CARBONATE ANTACID 1250 MG/5ML PO SUSP
500.0000 mg | Freq: Four times a day (QID) | ORAL | Status: DC | PRN
  Filled 2020-09-24: qty 5

## 2020-09-24 MED ORDER — NEPRO/CARBSTEADY PO LIQD
237.0000 mL | Freq: Three times a day (TID) | ORAL | Status: DC | PRN
  Filled 2020-09-24: qty 237

## 2020-09-24 MED ORDER — SORBITOL 70 % SOLN
30.0000 mL | Status: DC | PRN
  Filled 2020-09-24: qty 30

## 2020-09-24 MED ORDER — ONDANSETRON HCL 4 MG PO TABS: 4.0000 mg | ORAL_TABLET | Freq: Four times a day (QID) | ORAL | Status: DC | PRN

## 2020-09-24 MED ORDER — ZOLPIDEM TARTRATE 5 MG PO TABS: 5.0000 mg | ORAL_TABLET | Freq: Every evening | ORAL | Status: DC | PRN

## 2020-09-24 MED ORDER — ISOSORBIDE MONONITRATE ER 60 MG PO TB24
90.0000 mg | ORAL_TABLET | Freq: Every day | ORAL | Status: DC
  Administered 2020-09-24 – 2020-09-25 (×2): 90 mg via ORAL
  Filled 2020-09-24: qty 1
  Filled 2020-09-24: qty 3

## 2020-09-24 MED ORDER — CLOPIDOGREL BISULFATE 75 MG PO TABS
75.0000 mg | ORAL_TABLET | Freq: Every day | ORAL | Status: DC
  Administered 2020-09-24 – 2020-09-25 (×2): 75 mg via ORAL
  Filled 2020-09-24 (×2): qty 1

## 2020-09-24 MED ORDER — ATORVASTATIN CALCIUM 80 MG PO TABS
80.0000 mg | ORAL_TABLET | Freq: Every day | ORAL | Status: DC
  Administered 2020-09-24 – 2020-09-25 (×2): 80 mg via ORAL
  Filled 2020-09-24: qty 1
  Filled 2020-09-24: qty 8

## 2020-09-24 MED ORDER — ASPIRIN EC 81 MG PO TBEC
81.0000 mg | DELAYED_RELEASE_TABLET | Freq: Every day | ORAL | Status: DC
  Administered 2020-09-24 – 2020-09-25 (×2): 81 mg via ORAL
  Filled 2020-09-24 (×2): qty 1

## 2020-09-24 MED ORDER — ACETAMINOPHEN 650 MG RE SUPP: 650.0000 mg | Freq: Four times a day (QID) | RECTAL | Status: DC | PRN

## 2020-09-24 MED ORDER — AMLODIPINE BESYLATE 10 MG PO TABS
10.0000 mg | ORAL_TABLET | Freq: Every day | ORAL | Status: DC
  Administered 2020-09-24 – 2020-09-25 (×2): 10 mg via ORAL
  Filled 2020-09-24: qty 2
  Filled 2020-09-24: qty 1

## 2020-09-24 MED ORDER — HYDROXYZINE HCL 25 MG PO TABS: 25.0000 mg | ORAL_TABLET | Freq: Three times a day (TID) | ORAL | Status: DC | PRN

## 2020-09-24 MED ORDER — DOCUSATE SODIUM 283 MG RE ENEM
1.0000 | ENEMA | RECTAL | Status: DC | PRN
  Filled 2020-09-24: qty 1

## 2020-09-24 MED ORDER — ACETAMINOPHEN 325 MG PO TABS: 650.0000 mg | ORAL_TABLET | Freq: Four times a day (QID) | ORAL | Status: DC | PRN

## 2020-09-24 MED ORDER — METOPROLOL SUCCINATE ER 25 MG PO TB24
25.0000 mg | ORAL_TABLET | ORAL | Status: DC
  Administered 2020-09-25: 25 mg via ORAL
  Filled 2020-09-24: qty 1

## 2020-09-24 MED ORDER — CAMPHOR-MENTHOL 0.5-0.5 % EX LOTN
1.0000 "application " | TOPICAL_LOTION | Freq: Three times a day (TID) | CUTANEOUS | Status: DC | PRN
  Filled 2020-09-24: qty 222

## 2020-09-24 MED ORDER — HEPARIN SODIUM (PORCINE) 5000 UNIT/ML IJ SOLN
5000.0000 [IU] | Freq: Three times a day (TID) | INTRAMUSCULAR | Status: DC
  Administered 2020-09-24 – 2020-09-25 (×3): 5000 [IU] via SUBCUTANEOUS
  Filled 2020-09-24 (×3): qty 1

## 2020-09-24 NOTE — ED Notes (Signed)
RN notified PA of critical value trop of 112

## 2020-09-24 NOTE — ED Triage Notes (Signed)
Pt reports he started having chest pain at 0400 substernal this morning. Chest pain is non radiating. Pt states pain is 10/10. Pt took 324 aspirin and 3 nitros at home. EMS gave pt 2 nitros as well. Pt is ax0 x4 and stable. Pt had dialysis on Saturday and is due for dialysis on Tuesday.   EMS VS  BP 170/90

## 2020-09-24 NOTE — H&P (Signed)
History and Physical    Kenneth [name] BTD:[id number] DOB: November 08, 1955 DOA: 09/24/2020  PCP: PCP on [facility] Consult[facility]:  [name] - cardiology; nephrology Patient coming from:  Home - lives with daughter and son; NOK: Daughter, Yuvaan Olander, [phone]  Chief Complaint: Chest pain  HPI: Kenneth [name] is a 64 y.o. male with medical history significant of CAD with PEA arrest in 2019; HTN; ETOH abuse; ESRD on TTS HD; afib; and chronic combined CHF presenting with chest pain.  Cath in 04/2020 with severe multivessel CAD with medical management.  He was previously admitted form 11/26-28 for CP thought to be related to severe HTN; Amlodipine and Imdur were increased with resolution of CP.  He reports that chest pain was gone when he went home.  He went home and ate chili with beans and the chest pain recurred in the left upper chest.  No nausea.  It was worse with lying down, nothing made it better until he took NTG and then it resolved after taking 2 NTG.  +SOB.  Pain was similar to prior index symptoms.  Last HD was Saturday, no issues.    ED Course:  D/c yesterday after CP admission.  Now with creatinine increasing and recurrent CP.  Imdur/amlodipine increased during prior admission.  Cardiology is consulted.  Last HD on Saturday.  Review of Systems: As per HPI; otherwise review of systems reviewed and negative.   Ambulatory Status:  Ambulates without assistance  COVID Vaccine Status:   Complete plus booster  Past Medical History:  Diagnosis Date  . Acute CHF ([place]) 01/2018  . Cardiomyopathy secondary    likely related to HTN heart disease; possibly ETOH related as well  . Chronic combined systolic and diastolic heart failure (HCC)    Echocardiogram 09/22/11: Moderate LVH, EF 24-82%, grade 3 diastolic dysfunction, mild MR, moderate to severe LAE, mild RVE, mild to moderate TR, small to moderate pericardial effusion  . Coronary artery disease    not felt to be candidate for CABG 08/2018;  medical thearpy recomended due to high risk of PCI   . Dysrhythmia    aflutter 04/2016, afib 02/2018, not felt to be a candidate for anticoagulation due to non-compliance and ETOH  . ESRD (end stage renal disease) on dialysis ([place])    due to hypertensive nephrosclerosis; TTS; [address] (09/01/2018)  . History of alcohol abuse   . Hypertension   . Myocardial infarction [facility])    " mild " per daughter  . PEA (Pulseless electrical activity) ([place])    PEA arrest 03/09/18, treated empirically for hyperkalemia, shock x1 for WCT, given amiodarone, ROSC after 10 min of ACLS    Past Surgical History:  Procedure Laterality Date  . AV FISTULA PLACEMENT Left 05/14/2016   Procedure: LEFT ARM BASILIC VEIN TRANSPOSITION;  Surgeon: Rosetta Posner, MD;  Location: [facility];  Service: Vascular;  Laterality: Left;  . INSERTION OF DIALYSIS CATHETER N/A 06/20/2019   Procedure: INSERTION OF TUNNELED  DIALYSIS CATHETER;  Surgeon: Elam Dutch, MD;  Location: [facility];  Service: Vascular;  Laterality: N/A;  . LEFT HEART CATH AND CORONARY ANGIOGRAPHY N/A 09/02/2018   Procedure: LEFT HEART CATH AND CORONARY ANGIOGRAPHY;  Surgeon: Nelva Bush, MD;  Location: [place] CV LAB;  Service: Cardiovascular;  Laterality: N/A;  . LEFT HEART CATH AND CORONARY ANGIOGRAPHY N/A 05/14/2020   Procedure: LEFT HEART CATH AND CORONARY ANGIOGRAPHY;  Surgeon: Sherren Mocha, MD;  Location: [place] CV LAB;  Service: Cardiovascular;  Laterality: N/A;  .  PERIPHERAL VASCULAR CATHETERIZATION N/A 05/13/2016   Procedure: Dialysis/Perma Catheter Insertion;  Surgeon: Serafina Mitchell, MD;  Location: [facility] CV LAB;  Service: Cardiovascular;  Laterality: N/A;  . REVISION OF ARTERIOVENOUS GORETEX GRAFT Left 5/88/5027   Procedure: PLICATION OF ARTERIOVENOUS FISTULA LEFT ARM;  Surgeon: Elam Dutch, MD;  Location: [facility] OR;  Service: Vascular;  Laterality: Left;    Social History   Socioeconomic History  . Marital status: Divorced     Spouse name: Not on file  . Number of children: Not on file  . Years of education: Not on file  . Highest education level: Not on file  Occupational History  . Occupation: retired  Tobacco Use  . Smoking status: Former Smoker    Packs/day: 2.00    Years: 48.00    Pack years: 96.00    Types: Cigars    Quit date: 10/27/2017    Years since quitting: 2.9  . Smokeless tobacco: Former Systems developer    Types: Secondary school teacher  . Vaping Use: Never used  Substance and Sexual Activity  . Alcohol use: Not Currently    Comment: h/o very heavy use, quit about 2019  . Drug use: No  . Sexual activity: Not Currently  Other Topics Concern  . Not on file  Social History Narrative  . Not on file   Social Determinants of Health   Financial Resource Strain:   . Difficulty of Paying Living Expenses: Not on file  Food Insecurity:   . Worried About Charity fundraiser in the Last Year: Not on file  . Ran Out of Food in the Last Year: Not on file  Transportation Needs:   . Lack of Transportation (Medical): Not on file  . Lack of Transportation (Non-Medical): Not on file  Physical Activity:   . Days of Exercise per Week: Not on file  . Minutes of Exercise per Session: Not on file  Stress:   . Feeling of Stress : Not on file  Social Connections:   . Frequency of Communication with Friends and Family: Not on file  . Frequency of Social Gatherings with Friends and Family: Not on file  . Attends Religious Services: Not on file  . Active Member of Clubs or Organizations: Not on file  . Attends Archivist Meetings: Not on file  . Marital Status: Not on file  Intimate Partner Violence:   . Fear of Current or Ex-Partner: Not on file  . Emotionally Abused: Not on file  . Physically Abused: Not on file  . Sexually Abused: Not on file    No Known Allergies  Family History  Problem Relation Age of Onset  . Emphysema Mother   . Cirrhosis Father     Prior to Admission medications     Medication Sig Start Date End Date Taking? Authorizing Provider  amLODipine (NORVASC) 10 MG tablet Take 1 tablet (10 mg total) by mouth daily. 09/24/20   Dwyane Dee, MD  aspirin EC 81 MG EC tablet Take 1 tablet (81 mg total) by mouth daily. Swallow whole. 05/16/20   Alma Friendly, MD  atorvastatin (LIPITOR) 80 MG tablet Take 1 tablet (80 mg total) by mouth daily. 03/16/20   [name], [name], MD  clopidogrel (PLAVIX) 75 MG tablet Take 1 tablet by mouth once daily Patient taking differently: Take 75 mg by mouth daily.  05/17/20   Almyra Deforest, PA  diphenhydramine-acetaminophen (TYLENOL PM EXTRA STRENGTH) 25-500 MG TABS tablet Take 2 tablets by  mouth at bedtime as needed (Pain, Sleep).  05/12/18   [provider]  isosorbide mononitrate (IMDUR) 30 MG 24 hr tablet Take 3 tablets (90 mg total) by mouth daily. 09/24/20   Dwyane Dee, MD  metoprolol succinate (TOPROL-XL) 25 MG 24 hr tablet Take 1 tablet (25 mg total) by mouth 4 (four) times a week. Take on Sunday, Monday, Wednesday, Friday 06/26/20   [name], [name], MD  nitroGLYCERIN (NITROSTAT) 0.4 MG SL tablet DISSOLVE ONE TABLET UNDER THE TONGUE EVERY 5 MINUTES AS NEEDED FOR CHEST PAIN.  DO NOT EXCEED A TOTAL OF 3 DOSES IN 15 MINUTES Patient taking differently: Place 0.4 mg under the tongue every 5 (five) minutes as needed for chest pain.  09/04/20   [name], [name], MD    Physical Exam: Vitals:   09/24/20 0900 09/24/20 1056 09/24/20 1130 09/24/20 1200  BP: (!) 160/89 (!) 148/62 (!) 156/75 (!) 164/94  Pulse: 66 66 66 65  Resp: 18 13 12 15   Temp:      TempSrc:      SpO2: 99% 97% 99% 98%     . General:  Appears calm and comfortable and is NAD . Eyes:  PERRL, EOMI, normal lids, iris . ENT:  grossly normal hearing, lips & tongue, mmm . Neck:  no LAD, masses or thyromegaly . Cardiovascular:  RRR, no m/r/g. No LE edema.  Marland Kitchen Respiratory:   CTA bilaterally with no wheezes/rales/rhonchi.  Normal respiratory effort. . Abdomen:  soft,  NT, ND, NABS . Skin:  no rash or induration seen on limited exam . Musculoskeletal:  grossly normal tone BUE/BLE, good ROM, no bony abnormality . Psychiatric:  grossly normal mood and affect, speech fluent and appropriate, AOx3 . Neurologic:  CN 2-12 grossly intact, moves all extremities in coordinated fashion    Radiological Exams on Admission: Independently reviewed - see discussion in A/P where applicable  DG Chest Portable 1 View  Result Date: 09/24/2020 CLINICAL DATA:  Left-sided chest pain. EXAM: PORTABLE CHEST 1 VIEW COMPARISON:  09/21/2020 and 09/02/2020 FINDINGS: Small densities at left lung base may represent atelectasis. Otherwise, the lungs are clear. Negative for pulmonary edema. Heart and mediastinum are within normal limits. Atherosclerotic calcifications in the thoracic aorta. Negative for pneumothorax. Bone structures are unremarkable. IMPRESSION: No acute cardiopulmonary disease. Electronically Signed   By: Markus Daft M.D.   On: 09/24/2020 08:30    EKG: Independently reviewed.  NSR with rate 72; LVH, NSCSLT  Labs on Admission: I have personally reviewed the available labs and imaging studies at the time of the admission.  Pertinent labs:   K+ 5.2 BN 53/Creatinine 11.77/GFR 4 HS troponin 112, up from 53 on 11/27 Hgb 11.9, stable   Assessment/Plan Principal Problem:   Chest pain of uncertain etiology Active Problems:   Chronic diastolic CHF (congestive heart failure) ([place])   ESRD on hemodialysis ([place])   Accelerated hypertension   Dyslipidemia   Chest pain with h/o multivessel CAD -Patient with prior cath in 04/2020 showing severe multivessel disease, but he was not thought to be a cath candidate -He was admitted from 11/26-28 for chest pain which was attributed to uncontrolled HTN -His pain resolved with increased doses of Amlodipine and Imdur -At home last night, his pain recurred -It resolved with NTG x 2 -2/3 typical symptoms suggestive of atypical  cardiac chest pain, similar to prior index symptoms -CXR unremarkable.   -Initial cardiac HS troponin mildly elevated compared to prior (but ESRD patient with increased creatinine as well),  negative Delta -EKG not indicative of acute ischemia.   -Will plan to place in observation status on telemetry to rule out ACS by overnight observation.  -Repeat EKG in AM -Continue ASA 81 mg daily, Plavix, Imdur (cardiology increased dose during last hospitalization) -Cardiology consultation   HTN -Recent increase in amlopidine dose to 10 mg daily -Also on Toprol XL -Suboptimal control in the ER but he has not had AM medications -Consider addition of ACE/ARB, as HD patients tend to respond very well to ACE/ARB therapy -Will also add prn hydralazine  HLD -Continue Lipitor 80 -Lipids were checked on 11/26 (TC 167, HDL 44, LDL 109, TG 69) so will not repeat at this time -LDL goal is <70, ?compliance -If compliant with Lipitor 80 and still not at goal, consider transition to the other high-intensity statin (Crestor)  Chronic diastolic CHF  -04/3709 echo with preserved EF and grade 1 diastolic dysfunction -Volume status is maintained with HD  ESRD on HD -Patient on chronic TTS HD -Nephrology prn order set utilized -He does not appear to be volume overloaded or otherwise in need of acute HD -Dr. Jonnie Finner has been notified that patient will need HD either as inpatient or after d/c tomorrow   Note: This patient has been tested and is negative for the novel coronavirus COVID-19. She has been fully vaccinated against COVID-19.    DVT prophylaxis: Lovenox  Code Status:  Full - confirmed with patient Family Communication: None present Disposition Plan:  The patient is from: home  Anticipated d/c is to: home without [facility] services   Anticipated d/c date will depend on clinical response to treatment, but possibly as early as tomorrow if he has excellent response to treatment  Patient is currently:  acutely ill Consults called: Cardiology; nephrology  Admission status: It is my clinical opinion that referral for OBSERVATION is reasonable and necessary in this patient based on the above information provided. The aforementioned taken together are felt to place the patient at high risk for further clinical deterioration. However it is anticipated that the patient may be medically stable for discharge from the hospital within 24 to 48 hours.     Karmen Bongo MD [facility]   How to contact the [facility] Attending or Consulting provider [place] or covering provider during after hours [place], for this patient?  1. Check the care team in [facility] and look for a) attending/consulting TR[facility] provider listed and b) the [facility] team listed 2. Log into [email or web address] and use [place]'s universal password to access. If you do not have the password, please contact the hospital operator. 3. Locate the [facility] provider you are looking for under [facility] and page to a number that you can be directly reached. 4. If you still have difficulty reaching the provider, please page the [facility] (Director on Call) for the Hospitalists listed on amion for assistance.   09/24/2020, 1:01 PM

## 2020-09-24 NOTE — ED Provider Notes (Signed)
[facility] EMERGENCY DEPARTMENT Provider Note   CSN: [id number] Arrival date & time: 09/24/20  0272     History Chief Complaint  Patient presents with  . Chest Pain    Kenneth [name] is a 64 y.o. male CHF, NSTEMI (04/2020), end-stage renal disease on dialysis (Tues, Thurs, Sat), hypertension, cardiomyopathy and CAD.  Patient had left heart cath 04/2020 showed severe multivessel CAD, patient is being managed medically.  Recently admitted to [facility] on 09/21/20 for complaint of chest pain.  Patient was discharged on 11/28 with an increase in  amlodipine to 10 mg and Imdur was increased to 90 mg.  Last dialysis session occurred on Saturday.    Patient presents to the emergency department today with a chief complaint of left-sided chest pain that radiates into her left left arm.  Patient's pain with began suddenly at 4:00 this morning while he was trying to sleep.  Was described as a sharp stabbing sensation and rated 10/10.  Patient reports that the pain was worse when laying flat.  Patient denied any shortness of breath, nausea, vomiting, diaphoresis, palpitations, or leg swelling.  Patient to 324 mg of aspirin and 3 nitro's at home; he also received 2 nitros from EMS.  At present patient denies any chest pain.  Patient also reports complaint of back pain, he states this back pain is not new.  Patient reports he has been taking his increased doses of amlodipine and indoor since returning home on Sunday.  She denies any tobacco use, alcohol use, or illicit drug use.  HPI     Past Medical History:  Diagnosis Date  . Acute CHF ([place]) 01/2018  . Cardiomyopathy secondary    likely related to HTN heart disease; possibly ETOH related as well  . Chronic combined systolic and diastolic heart failure (HCC)    Echocardiogram 09/22/11: Moderate LVH, EF 53-66%, grade 3 diastolic dysfunction, mild MR, moderate to severe LAE, mild RVE, mild to moderate TR, small to moderate pericardial  effusion  . Coronary artery disease    not felt to be candidate for CABG 08/2018; medical thearpy recomended due to high risk of PCI   . Dysrhythmia    aflutter 04/2016, afib 02/2018, not felt to be a candidate for anticoagulation due to non-compliance and ETOH  . ESRD (end stage renal disease) on dialysis ([place])    due to hypertensive nephrosclerosis; TTS; [address] (09/01/2018)  . History of alcohol abuse   . Hypertension   . Myocardial infarction [facility])    " mild " per daughter  . PEA (Pulseless electrical activity) ([place])    PEA arrest 03/09/18, treated empirically for hyperkalemia, shock x1 for WCT, given amiodarone, ROSC after 10 min of ACLS    Patient Active Problem List   Diagnosis Date Noted  . Chest pain of uncertain etiology 44/12/4740  . Accelerated hypertension 09/24/2020  . Dyslipidemia 09/24/2020  . NSTEMI (non-ST elevated myocardial infarction) ([place]) 05/12/2020  . Non-ST elevated myocardial infarction ([place]) 05/12/2020  . DNR (do not resuscitate) discussion   . Palliative care by specialist   . Goals of care, counseling/discussion   . Coronary artery disease involving native heart with angina pectoris ([place])   . STEMI (ST elevation myocardial infarction) ([place]) 09/17/2018  . Unstable angina ([place])   . ACS (acute coronary syndrome) ([place]) 09/01/2018  . ESRD (end stage renal disease) ([place]) 07/20/2018  . Acute respiratory failure ([place]) 05/11/2018  . Cardiac arrest ([place]) 03/09/2018  . Acute encephalopathy   .  Acute on chronic respiratory failure ([place]) 02/16/2018  . Tobacco dependence 02/16/2018  . Pulmonary edema 01/26/2018  . Acute respiratory failure with hypoxia ([place]) 01/26/2018  . Atrial fibrillation and flutter ([place])   . Shortness of breath   . Acute on chronic diastolic CHF (congestive heart failure) ([place])   . Hypothermia 10/08/2016  . ESRD on hemodialysis ([place]) 10/08/2016  . Fall 10/08/2016  . Syncope 10/07/2016  . Near syncope 07/07/2016  . Cardiomyopathy ([place])  07/07/2016  . Ventricular tachycardia, non-sustained ([place]) 07/06/2016  . Hypoglycemia 07/06/2016  . CKD (chronic kidney disease) stage 5, GFR less than 15 ml/min (HCC)   . Alcoholism ([place]) 05/13/2016  . Hypertensive emergency 12/08/2013  . Cardiomyopathy secondary 10/09/2011  . Alcohol withdrawal delirium ([place]) 09/25/2011  . Chronic diastolic CHF (congestive heart failure) ([place]) 09/24/2011    Past Surgical History:  Procedure Laterality Date  . AV FISTULA PLACEMENT Left 05/14/2016   Procedure: LEFT ARM BASILIC VEIN TRANSPOSITION;  Surgeon: Rosetta Posner, MD;  Location: [place];  Service: Vascular;  Laterality: Left;  . INSERTION OF DIALYSIS CATHETER N/A 06/20/2019   Procedure: INSERTION OF TUNNELED  DIALYSIS CATHETER;  Surgeon: Elam Dutch, MD;  Location: [place];  Service: Vascular;  Laterality: N/A;  . LEFT HEART CATH AND CORONARY ANGIOGRAPHY N/A 09/02/2018   Procedure: LEFT HEART CATH AND CORONARY ANGIOGRAPHY;  Surgeon: Nelva Bush, MD;  Location: [place] CV LAB;  Service: Cardiovascular;  Laterality: N/A;  . LEFT HEART CATH AND CORONARY ANGIOGRAPHY N/A 05/14/2020   Procedure: LEFT HEART CATH AND CORONARY ANGIOGRAPHY;  Surgeon: Sherren Mocha, MD;  Location: [place] CV LAB;  Service: Cardiovascular;  Laterality: N/A;  . PERIPHERAL VASCULAR CATHETERIZATION N/A 05/13/2016   Procedure: Dialysis/Perma Catheter Insertion;  Surgeon: Serafina Mitchell, MD;  Location: [place] CV LAB;  Service: Cardiovascular;  Laterality: N/A;  . REVISION OF ARTERIOVENOUS GORETEX GRAFT Left 3/55/7322   Procedure: PLICATION OF ARTERIOVENOUS FISTULA LEFT ARM;  Surgeon: Elam Dutch, MD;  Location: [facility] OR;  Service: Vascular;  Laterality: Left;       Family History  Problem Relation Age of Onset  . Emphysema Mother   . Cirrhosis Father     Social History   Tobacco Use  . Smoking status: Former Smoker    Packs/day: 2.00    Years: 48.00    Pack years: 96.00    Types: Cigars    Quit  date: 10/27/2017    Years since quitting: 2.9  . Smokeless tobacco: Former Systems developer    Types: Secondary school teacher  . Vaping Use: Never used  Substance Use Topics  . Alcohol use: Not Currently    Comment: h/o very heavy use, quit about 2019  . Drug use: No    Home Medications Prior to Admission medications   Medication Sig Start Date End Date Taking? Authorizing Provider  amLODipine (NORVASC) 10 MG tablet Take 1 tablet (10 mg total) by mouth daily. 09/24/20  Yes Dwyane Dee, MD  aspirin EC 81 MG EC tablet Take 1 tablet (81 mg total) by mouth daily. Swallow whole. 05/16/20  Yes Alma Friendly, MD  atorvastatin (LIPITOR) 80 MG tablet Take 1 tablet (80 mg total) by mouth daily. 03/16/20  Yes [name], Rola Lennon M, MD  clopidogrel (PLAVIX) 75 MG tablet Take 1 tablet by mouth once daily Patient taking differently: Take 75 mg by mouth daily.  05/17/20  Yes Meng, Isaac Laud, PA  diphenhydramine-acetaminophen (TYLENOL PM EXTRA STRENGTH) 25-500 MG TABS tablet Take 2  tablets by mouth at bedtime as needed (Pain, Sleep).  05/12/18  Yes [provider]  isosorbide mononitrate (IMDUR) 30 MG 24 hr tablet Take 3 tablets (90 mg total) by mouth daily. 09/24/20  Yes Dwyane Dee, MD  metoprolol succinate (TOPROL-XL) 25 MG 24 hr tablet Take 1 tablet (25 mg total) by mouth 4 (four) times a week. Take on Sunday, Monday, Wednesday, Friday 06/26/20  Yes [name], Tillman Kazmierski M, MD  nitroGLYCERIN (NITROSTAT) 0.4 MG SL tablet DISSOLVE ONE TABLET UNDER THE TONGUE EVERY 5 MINUTES AS NEEDED FOR CHEST PAIN.  DO NOT EXCEED A TOTAL OF 3 DOSES IN 15 MINUTES Patient taking differently: Place 0.4 mg under the tongue every 5 (five) minutes as needed for chest pain.  09/04/20  Yes [name], Olive Zmuda M, MD    Allergies    Patient has no known allergies.  Review of Systems   Review of Systems  Constitutional: Negative for chills, diaphoresis and fever.  Eyes: Negative for visual disturbance.  Respiratory: Negative for cough and shortness of  breath.   Cardiovascular: Negative for chest pain, palpitations and leg swelling.  Gastrointestinal: Negative for abdominal pain, nausea and vomiting.  Genitourinary: Negative for difficulty urinating and dysuria.  Musculoskeletal: Positive for back pain. Negative for neck pain.  Skin: Negative for color change and rash.  Neurological: Negative for dizziness, syncope, light-headedness and headaches.  Psychiatric/Behavioral: Negative for confusion.    Physical Exam Updated Vital Signs BP (!) 176/81   Pulse 67   Temp 98 F (36.7 C) (Oral)   Resp 11   SpO2 96%   Physical Exam Constitutional:      General: He is not in acute distress.    Appearance: He is not ill-appearing, toxic-appearing or diaphoretic.  HENT:     Head: Normocephalic.  Neck:     Vascular: No JVD.  Cardiovascular:     Rate and Rhythm: Normal rate and regular rhythm.     Pulses:          Radial pulses are 3+ on the right side and 3+ on the left side.     Heart sounds: Normal heart sounds.  Pulmonary:     Effort: Pulmonary effort is normal. No respiratory distress.     Breath sounds: Normal breath sounds.  Chest:     Chest wall: No deformity, tenderness or crepitus.  Abdominal:     Palpations: Abdomen is soft.     Tenderness: There is no abdominal tenderness.  Musculoskeletal:     Right lower leg: No edema.     Left lower leg: No edema.     Comments: Fistula located in left upper arm, palpable thrill, no erythema  Skin:    General: Skin is warm and dry.  Neurological:     General: No focal deficit present.     Mental Status: He is alert.     ED Results / Procedures / Treatments   Labs (all labs ordered are listed, but only abnormal results are displayed) Labs Reviewed  BASIC METABOLIC PANEL - Abnormal; Notable for the following components:      Result Value   Potassium 5.2 (*)    Chloride 97 (*)    BUN 53 (*)    Creatinine, Ser 11.77 (*)    GFR, Estimated 4 (*)    Anion gap 18 (*)    All  other components within normal limits  CBC - Abnormal; Notable for the following components:   RBC 3.78 (*)    Hemoglobin 11.9 (*)  HCT 36.3 (*)    RDW 17.3 (*)    All other components within normal limits  TROPONIN I (HIGH SENSITIVITY) - Abnormal; Notable for the following components:   Troponin I (High Sensitivity) 112 (*)    All other components within normal limits  TROPONIN I (HIGH SENSITIVITY) - Abnormal; Notable for the following components:   Troponin I (High Sensitivity) 116 (*)    All other components within normal limits  RESP PANEL BY RT-PCR (FLU A&B, COVID) [id number]    EKG EKG Interpretation  Date/Time:  Monday September 24 2020 07:14:53 EST Ventricular Rate:  72 PR Interval:    QRS Duration: 241 QT Interval:  416 QTC Calculation: 456 R Axis:     Text Interpretation: Sinus rhythm Ventricular premature complex LVH with secondary repolarization abnormality Anterior Q waves, possibly due to LVH No significant change since 09/22/2020 Confirmed by Veryl Speak [phone]) on 09/24/2020 7:30:26 AM   Radiology DG Chest Portable 1 View  Result Date: 09/24/2020 CLINICAL DATA:  Left-sided chest pain. EXAM: PORTABLE CHEST 1 VIEW COMPARISON:  09/21/2020 and 09/02/2020 FINDINGS: Small densities at left lung base may represent atelectasis. Otherwise, the lungs are clear. Negative for pulmonary edema. Heart and mediastinum are within normal limits. Atherosclerotic calcifications in the thoracic aorta. Negative for pneumothorax. Bone structures are unremarkable. IMPRESSION: No acute cardiopulmonary disease. Electronically Signed   By: Markus Daft M.D.   On: 09/24/2020 08:30    Procedures Procedures (including critical care time)  Medications Ordered in ED Medications  aspirin EC tablet 81 mg (has no administration in time range)  amLODipine (NORVASC) tablet 10 mg (has no administration in time range)  atorvastatin (LIPITOR) tablet 80 mg (has no administration in time range)    isosorbide mononitrate (IMDUR) 24 hr tablet 90 mg (has no administration in time range)  metoprolol succinate (TOPROL-XL) 24 hr tablet 25 mg (has no administration in time range)  clopidogrel (PLAVIX) tablet 75 mg (has no administration in time range)  acetaminophen (TYLENOL) tablet 650 mg (has no administration in time range)    Or  acetaminophen (TYLENOL) suppository 650 mg (has no administration in time range)  zolpidem (AMBIEN) tablet 5 mg (has no administration in time range)  sorbitol 70 % solution 30 mL (has no administration in time range)  docusate sodium (ENEMEEZ) enema 283 mg (has no administration in time range)  ondansetron (ZOFRAN) tablet 4 mg (has no administration in time range)    Or  ondansetron (ZOFRAN) injection 4 mg (has no administration in time range)  camphor-menthol (SARNA) lotion 1 application (has no administration in time range)    And  hydrOXYzine (ATARAX/VISTARIL) tablet 25 mg (has no administration in time range)  calcium carbonate (dosed in mg elemental calcium) suspension 500 mg of elemental calcium (has no administration in time range)  feeding supplement (NEPRO CARB STEADY) liquid 237 mL (has no administration in time range)  heparin injection 5,000 Units (has no administration in time range)    ED Course  I have reviewed the triage vital signs and the nursing notes.  Pertinent labs & imaging results that were available during my care of the patient were reviewed by me and considered in my medical decision making (see chart for details).  Clinical Course as of Sep 24 1525  Mon Sep 24, 2020  0730 EKG 12-Lead [PB]  1021 Troponin I (High Sensitivity)(!!) [PB]    Clinical Course User Index [PB] Loni Beckwith, PA-C   MDM Rules/Calculators/A&P  64 year old male, alert, resting comfortably in hospital bed, in no acute distress without complaints of pain at present.  Patient has ESRD on dialysis , last dialysis  appointment was Saturday 11/71/21.  Has extensive cardiac history, left heart cath 04/2020 which showed severe multivessel CAD; patient is being managed medically.  Patient recently admitted 11/26, amlodipine and Imdur medications were increased by cardiology.  Patient's chest pain began this morning at 4 AM, came on suddenly, was left-sided with radiation to left arm, described as sharp having pain, 10/10 on pain scale, worse with laying flat.  No associated shortness of breath, nausea, vomiting, or diaphoresis.  Patient took 324mg  ASA and 3 nitros at home.  Patient received 2 nitros from EMS.  At present patient denies any chest pain.    EKG showed sinus rhythm with PVC,  with secondary repolarization abnormality Anterior Q waves, possibly due to LVH No significant change since 09/22/2020.  Chest x-ray, BNP, CBC, troponin pending.    Due to patient's complaint history he will be worked up for ACS.  The emergent differential diagnosis of chest pain includes: Acute coronary syndrome, pericarditis, aortic dissection, pulmonary embolism, tension pneumothorax, pneumonia, and esophageal rupture.   We will look to see if troponin is elevated from his last labs drawn from previous admission.  Will consult cardiology to discuss management of patient.  If all labs and imaging are reassuring and patient remains pain-free will discharge with follow-up to see his cardiologist.  Chest x-ray shows no pneumothorax, no widening of the mediastinum, no pneumonia, no acute cardiopulmonary disease.  CBC shows anemia, values are improved from 1 day prior and appear to be at baseline.  Likely due to anemia of chronic disease.  Troponin is noted to be 112 which is elevated from previous labs drawn 3 and 2 days prior.  Cardiology was contacted and informed of patient.   Spoke with provider with hospitalist, [place]ia [place], for consult, observation, and admission for patient.    Final Clinical Impression(s) / ED  Diagnoses Final diagnoses:  Chest pain, unspecified type  Elevated troponin    Rx / DC Orders ED Discharge Orders    None       Loni Beckwith, PA-C 09/24/20 1526    Veryl Speak, MD 09/25/20 0800

## 2020-09-24 NOTE — Consult Note (Addendum)
Cardiology Consultation:   Patient ID: Kenneth [name]; [id number]; 11-Mar-1956   Admit date: 09/24/2020 Date of Consult: 09/24/2020  Primary Care Provider: [name], [name], MD Primary Cardiologist: Peter [name], MD   Patient Profile:   Kenneth [name] is a 64 y.o. male with a hx of of multivessel CAD per LHC 04/2020>> medically managed, chronic systolic and diastolic heart failure, heavy ETOH use, afib/flutter not a candidate for anticoagulation due to noncompliance, ESRD on HD (T,TH,S), HTN, and NSVT who is being seen today for the evaluation of chest pain at the request of Dr. Lorin Mercy.  History of Present Illness:   Kenneth [name] is a 64yo M with a hx as stated above who presented to the ED with recurrent chest pain.   He has a history of multivessel CAD in the LAD, LCx and bifurcation, and RCA. He has been medically managed since 08/2018 due to poor revascularization targets. He was turned down for CABG, in part due to medical noncompliance. He is ESRD on HD and has a history of CP during HD,  Prompting an ER evaluation and admission 05/12/20. In addition, he has a history of hospitalization with CHF related to HD volume removal issues - one occasion complicated by cardiac arrest (02/2018 - PEA then shock for WCT --> briefly Afib RVR). His EF has been as low as 25-30% in 2017, but was improved to 65-70% on echo 11/2017. Echo 03/09/18 with severe LVH, EF 55-60% and moderately decreased RV function.   During a hospitalization 04/2020 for chest pain he underwent repeat heart cath which continued to show distal LM disease, ostial/proximal LAD, proximal left Cx/OM, and proximal 75% RCA and distal RCA with CTO with left-to-right collaterals. Plan was for continued medical therapy as protected PCI would be extremely risky per cath notation. Echo at that time with EF 55-60%, moderate LVH, grade 1 DD, and moderate MR. He was not seen in follow up after this heart cath for TOC.   He then presented back to [facility]  ED 09/21/20 with chest pain radiating to left and jaw relieved with nitro. He was also markedly hypertensive with a BP at 197/88. He was admitted for MI rule out. HsT levels were 43>>44>>47>>53. EKG showed sinus rhythm HR 69, TWI inferior and lateral leads. Cardiology felt there was no utility in repeating LHC. Chest pain felt to be in the setting of HTN urgency. IM added amlodipine and cards increased Imdur dosing to 90mg  QD.   He was ultimately discharged 09/23/20 and returned 09/24/20 with recurrent left sided chest pain with radiation to left arm which began early this morning around 4am. Pain described as a sharp, shooting pain>>worse with laying flat. He states he most often will get chest pain with laying or "sleepin". Does not appear to be exertional. There was no diaphoresis, N/V, palpitations, or LE edema. On EMS arrival, he received SL NTG with relief. He states he has not tried SL NTG on his own. HsT found to be more elevated than last admission (two days ago HsT 53) however there are no EKG changes. Creatinine elevated to 11.77 from 8.82>>last HD was Saturday as scheduled. CXR with no acute cardiopulmomary disease.    Cardiology has been asked to evaluate given severe CAD hx.   Past Medical History:  Diagnosis Date  . Acute CHF ([place]) 01/2018  . Cardiomyopathy secondary    likely related to HTN heart disease; possibly ETOH related as well  . Chronic combined systolic and diastolic heart  failure ([place])    Echocardiogram 09/22/11: Moderate LVH, EF 09-98%, grade 3 diastolic dysfunction, mild MR, moderate to severe LAE, mild RVE, mild to moderate TR, small to moderate pericardial effusion  . Coronary artery disease    not felt to be candidate for CABG 08/2018; medical thearpy recomended due to high risk of PCI   . Dysrhythmia    aflutter 04/2016, afib 02/2018, not felt to be a candidate for anticoagulation due to non-compliance and ETOH  . ESRD (end stage renal disease) on dialysis ([place])     due to hypertensive nephrosclerosis; TTS; [address] (09/01/2018)  . History of alcohol abuse   . Hypertension   . Myocardial infarction [facility])    " mild " per daughter  . PEA (Pulseless electrical activity) ([place])    PEA arrest 03/09/18, treated empirically for hyperkalemia, shock x1 for WCT, given amiodarone, ROSC after 10 min of ACLS    Past Surgical History:  Procedure Laterality Date  . AV FISTULA PLACEMENT Left 05/14/2016   Procedure: LEFT ARM BASILIC VEIN TRANSPOSITION;  Surgeon: Rosetta Posner, MD;  Location: [place];  Service: Vascular;  Laterality: Left;  . INSERTION OF DIALYSIS CATHETER N/A 06/20/2019   Procedure: INSERTION OF TUNNELED  DIALYSIS CATHETER;  Surgeon: Elam Dutch, MD;  Location: [place];  Service: Vascular;  Laterality: N/A;  . LEFT HEART CATH AND CORONARY ANGIOGRAPHY N/A 09/02/2018   Procedure: LEFT HEART CATH AND CORONARY ANGIOGRAPHY;  Surgeon: Nelva Bush, MD;  Location: [place] CV [facility];  Service: Cardiovascular;  Laterality: N/A;  . LEFT HEART CATH AND CORONARY ANGIOGRAPHY N/A 05/14/2020   Procedure: LEFT HEART CATH AND CORONARY ANGIOGRAPHY;  Surgeon: Sherren Mocha, MD;  Location: [place] CV [facility];  Service: Cardiovascular;  Laterality: N/A;  . PERIPHERAL VASCULAR CATHETERIZATION N/A 05/13/2016   Procedure: Dialysis/Perma Catheter Insertion;  Surgeon: Serafina Mitchell, MD;  Location: [place] CV LAB;  Service: Cardiovascular;  Laterality: N/A;  . REVISION OF ARTERIOVENOUS GORETEX GRAFT Left 3/38/2505   Procedure: PLICATION OF ARTERIOVENOUS FISTULA LEFT ARM;  Surgeon: Elam Dutch, MD;  [facility] OR;  Service: Vascular;  Laterality: Left;     Prior to Admission medications   Medication Sig Start Date End Date Taking? Authorizing Provider  amLODipine (NORVASC) 10 MG tablet Take 1 tablet (10 mg total) by mouth daily. 09/24/20   Dwyane Dee, MD  aspirin EC 81 MG EC tablet Take 1 tablet (81 mg total) by mouth daily. Swallow whole. 05/16/20   Alma Friendly, MD  atorvastatin (LIPITOR) 80 MG tablet Take 1 tablet (80 mg total) by mouth daily. 03/16/20   [name], [name], MD  clopidogrel (PLAVIX) 75 MG tablet Take 1 tablet by mouth once daily Patient taking differently: Take 75 mg by mouth daily.  05/17/20   Almyra Deforest, PA  diphenhydramine-acetaminophen (TYLENOL PM EXTRA STRENGTH) 25-500 MG TABS tablet Take 2 tablets by mouth at bedtime as needed (Pain, Sleep).  05/12/18   [provider]  isosorbide mononitrate (IMDUR) 30 MG 24 hr tablet Take 3 tablets (90 mg total) by mouth daily. 09/24/20   Dwyane Dee, MD  metoprolol succinate (TOPROL-XL) 25 MG 24 hr tablet Take 1 tablet (25 mg total) by mouth 4 (four) times a week. Take on Sunday, Monday, Wednesday, Friday 06/26/20   [name], [name], MD  nitroGLYCERIN (NITROSTAT) 0.4 MG SL tablet DISSOLVE ONE TABLET UNDER THE TONGUE EVERY 5 MINUTES AS NEEDED FOR CHEST PAIN.  DO NOT EXCEED A TOTAL OF 3  DOSES IN 15 MINUTES Patient taking differently: Place 0.4 mg under the tongue every 5 (five) minutes as needed for chest pain.  09/04/20   [name], [name], MD    Inpatient Medications: Scheduled Meds:  Continuous Infusions:  PRN Meds:   Allergies:   No Known Allergies  Social History:   Social History   Socioeconomic History  . Marital status: Divorced    Spouse name: Not on file  . Number of children: Not on file  . Years of education: Not on file  . Highest education level: Not on file  Occupational History  . Not on file  Tobacco Use  . Smoking status: Former Smoker    Packs/day: 2.00    Years: 48.00    Pack years: 96.00    Types: Cigars    Quit date: 10/27/2017    Years since quitting: 2.9  . Smokeless tobacco: Former Systems developer    Types: Secondary school teacher  . Vaping Use: Never used  Substance and Sexual Activity  . Alcohol use: Yes    Comment: 1 L liquor  . Drug use: No  . Sexual activity: Not Currently  Other Topics Concern  . Not on file  Social History Narrative  . Not on  file   Social Determinants of Health   Financial Resource Strain:   . Difficulty of Paying Living Expenses: Not on file  Food Insecurity:   . Worried About Charity fundraiser in the Last Year: Not on file  . Ran Out of Food in the Last Year: Not on file  Transportation Needs:   . Lack of Transportation (Medical): Not on file  . Lack of Transportation (Non-Medical): Not on file  Physical Activity:   . Days of Exercise per Week: Not on file  . Minutes of Exercise per Session: Not on file  Stress:   . Feeling of Stress : Not on file  Social Connections:   . Frequency of Communication with Friends and Family: Not on file  . Frequency of Social Gatherings with Friends and Family: Not on file  . Attends Religious Services: Not on file  . Active Member of Clubs or Organizations: Not on file  . Attends Archivist Meetings: Not on file  . Marital Status: Not on file  Intimate Partner Violence:   . Fear of Current or Ex-Partner: Not on file  . Emotionally Abused: Not on file  . Physically Abused: Not on file  . Sexually Abused: Not on file    Family History:   Family History  Problem Relation Age of Onset  . Emphysema Mother   . Cirrhosis Father    Family Status:  Family Status  Relation Name Status  . Mother  Deceased  . Father  Deceased  . MGM  Deceased  . MGF  Deceased  . PGM  Deceased  . PGF  Deceased    ROS:  Please see the history of present illness.  All other ROS reviewed and negative.     Physical Exam/Data:   Vitals:   09/24/20 0716 09/24/20 0829 09/24/20 0900 09/24/20 1056  BP: (!) 155/73 (!) 159/72 (!) 160/89 (!) 148/62  Pulse: 68 68 66 66  Resp: 15 11 18 13   Temp: 98 F (36.7 C)     TempSrc: Oral     SpO2: 95% 99% 99% 97%   No intake or output data in the 24 hours ending 09/24/20 1140 There were no vitals filed for this visit. There  is no height or weight on file to calculate BMI.   General: Well developed, well nourished, NAD Neck:  Negative for carotid bruits. No JVD Lungs:Clear to ausculation bilaterally. No wheezes, rales, or rhonchi. Breathing is unlabored. Cardiovascular: RRR with S1 S2. No murmurs Abdomen: Soft, non-tender, non-distended. No obvious abdominal masses. Extremities: No edema. Radial pulses 2+ bilaterally Neuro: Alert and oriented. No focal deficits. No facial asymmetry. MAE spontaneously. Psych: Responds to questions appropriately with normal affect.     EKG:  The EKG was personally reviewed and demonstrates:  09/24/20 NSR with anterior Q waves and TWI in anterior, lateral and inferior leads>>>consisitent with prior tracings.  Telemetry:  Telemetry was personally reviewed and demonstrates: 09/24/20 NSR, HR 70-80's  Relevant CV Studies:  Left heart cath 05/14/20: 1. Severe multivessel coronary artery disease with severe calcific distal left main stenosis, severe diffuse ostial/proximal LAD stenosis, severe stenosis of the proximal left circumflex/first OM, and interval occlusion of the distal RCA with left-to-right collaterals 2. Heavily calcified distal abdominal aorta and bilateral iliac arteries with mild diffuse calcified disease. No severe iliac lesions identified.  Recommend ongoing medical therapy. With total occlusion of the RCA and severe calcification of the distal left main and complex disease in the LAD and left circumflex, I think risk of protected PCI would be extremely high. I also think his access is marginal for large bore support devices from the groin.  Diagnostic Dominance: Right      Echo 05/13/20: 1. Left ventricular ejection fraction, by estimation, is 55 to 60%. The  left ventricle has normal function. The left ventricle has no regional  wall motion abnormalities. There is moderate left ventricular hypertrophy.  Left ventricular diastolic  parameters are consistent with Grade I diastolic dysfunction (impaired  relaxation).  2. Right ventricular systolic function  is normal. The right ventricular  size is normal. There is normal pulmonary artery systolic pressure.  3. Left atrial size was mildly dilated.  4. The mitral valve is abnormal. Moderate mitral valve regurgitation.  5. The aortic valve is tricuspid. Aortic valve regurgitation is not  visualized. Mild aortic valve sclerosis is present, with no evidence of  aortic valve stenosis.   Laboratory Data:  Chemistry Recent Labs  Lab 09/21/20 1708 09/23/20 0418 09/24/20 0904  NA 142 139 139  K 4.4 4.0 5.2*  CL 95* 97* 97*  CO2 30 26 24   GLUCOSE 96 104* 90  BUN 63* 34* 53*  CREATININE 11.00* 8.82* 11.77*  CALCIUM 9.5 8.3* 9.1  GFRNONAA 5* 6* 4*  ANIONGAP 17* 16* 18*    Total Protein  Date Value Ref Range Status  09/21/2020 7.4 6.5 - 8.1 g/dL Final  11/19/2018 8.4 6.0 - 8.5 g/dL Final   Albumin  Date Value Ref Range Status  09/21/2020 3.2 (L) 3.5 - 5.0 g/dL Final  11/19/2018 4.4 3.8 - 4.8 g/dL Final    Comment:                  **Please note reference interval change**   AST  Date Value Ref Range Status  09/21/2020 12 (L) 15 - 41 U/L Final   ALT  Date Value Ref Range Status  09/21/2020 9 0 - 44 U/L Final   Alkaline Phosphatase  Date Value Ref Range Status  09/21/2020 55 38 - 126 U/L Final   Total Bilirubin  Date Value Ref Range Status  09/21/2020 0.6 0.3 - 1.2 mg/dL Final   Bilirubin Total  Date Value Ref Range Status  11/19/2018 0.5 0.0 - 1.2 mg/dL Final   Hematology Recent Labs  Lab 09/21/20 1708 09/23/20 0418 09/24/20 0904  WBC 6.6 6.1 6.7  RBC 3.53* 3.56* 3.78*  HGB 11.3* 11.4* 11.9*  HCT 33.9* 32.8* 36.3*  MCV 96.0 92.1 96.0  MCH 32.0 32.0 31.5  MCHC 33.3 34.8 32.8  RDW 17.2* 17.2* 17.3*  PLT 204 192 212   Cardiac EnzymesNo results for input(s): TROPONINI in the last 168 hours. No results for input(s): TROPIPOC in the last 168 hours.  BNPNo results for input(s): BNP, PROBNP in the last 168 hours.  DDimer No results for input(s): DDIMER in the  last 168 hours. TSH:  Lab Results  Component Value Date   TSH 0.825 12/03/2017   Lipids: Lab Results  Component Value Date   CHOL 167 09/21/2020   HDL 44 09/21/2020   LDLCALC 109 (H) 09/21/2020   TRIG 69 09/21/2020   CHOLHDL 3.8 09/21/2020   HgbA1c: Lab Results  Component Value Date   HGBA1C 5.7 (H) 05/12/2020    Radiology/Studies:  DG Chest Portable 1 View  Result Date: 09/24/2020 CLINICAL DATA:  Left-sided chest pain. EXAM: PORTABLE CHEST 1 VIEW COMPARISON:  09/21/2020 and 09/02/2020 FINDINGS: Small densities at left lung base may represent atelectasis. Otherwise, the lungs are clear. Negative for pulmonary edema. Heart and mediastinum are within normal limits. Atherosclerotic calcifications in the thoracic aorta. Negative for pneumothorax. Bone structures are unremarkable. IMPRESSION: No acute cardiopulmonary disease. Electronically Signed   By: Markus Daft M.D.   On: 09/24/2020 08:30   DG Chest Port 1 View  Result Date: 09/21/2020 CLINICAL DATA:  Chest pain short of breath EXAM: PORTABLE CHEST 1 VIEW COMPARISON:  09/02/2020 FINDINGS: Mildly diminished lung volumes. Mild cardiomegaly. Negative for edema, focal opacity or pleural effusion. No pneumothorax. Aortic atherosclerosis IMPRESSION: Mild cardiomegaly. No edema or infiltrate. Electronically Signed   By: Donavan Foil M.D.   On: 09/21/2020 17:11   Assessment and Plan:   1. Chest Pain with known multi vessel CAD: -Last LHC 04/2020 with severe multivessel CAD with severe calcific distal left main stenosis, severe diffuse ostial/proximal LAD stenosis, severe stenosis of the proximal left circumflex/first OM, and interval occlusion of the distal RCA with left-to-right collaterals with recommendations for ongoing medical therapy as TCTS felt there are no adequate targets and protected PCI with CTO of the RCA and calcification of the distal LM, along with calcifications of the bilateral iliac arteries, felt to be very high risk.  There are also concerns given poor compliance in the past with ongoing alcohol and tobacco use and multiple co morbid conditions  -IMDUR titrated at last admission to 90mg  QD>>unable to tolerate Ranexa.  -Repeat LHC felt to have no utility>>agreed  -Main goal at this point should be BP control and rid reduction with further titration of IMDUR if possible>>to discuss with MD  -Would continue to cycle enzymes given slight elevation from 09/22/20  2. HTN: -Elevated, 164/94>156/75>>148/62 -More stable today than last admission>>>SBPs at that time were in the 180-200 range at presentation  -Amlodipine and IMDUR up titrated  -Reports medication compliance  3. Chronic combined systolic and diastolic CHF: -Last echo with stable LVEF at 55-60% and G1DD however EF has been as low as 25-30% in 2017, but improved to 65-70% on echo 11/2017. Echo 03/09/18 with severe LVH, EF 55-60%  -Appear euvolemic on exam -Fluid volume managed with HD -CXR with no acute process   4. ESRD on HD: -T TH S schedule with last  dialysis Saturday  -Nephrology consult  -Creatinine, 11.77 today>>up from 8.82   For questions or updates, please contact [place] Please consult [email or web address] for contact info under Cardiology/STEMI.   SignedKathyrn Drown NP-C [facility] Pager: [phone] 09/24/2020 11:40 AM   I have examined the patient and reviewed assessment and plan and discussed with patient.  Agree with above as stated.  Patient is currently pain free.  Flat affect.  Difficult to get much information from him. Reports he does not walk much.  I reviewed his pictures and the prior note from Dr. PVT.  I spoke with Dr. [name] as well.  Patient with complex anatomy and any intervention attempt would be extremely high risk.  Given his comorbidities, risks> benefits.  Issues of noncompliance over the years as well.  Would continue medical therapy and would not pursue invasive testing.  Consider palliative consult as  well.    Larae Grooms

## 2020-09-24 NOTE — Progress Notes (Signed)
Patient arrived to room 5C08 in NAD, VS stable. Patient free from pain. Patient oriented to room and advised on visitation and patient belonging policy. Only belongings at bedside are patient's clothes.

## 2020-09-25 DIAGNOSIS — R778 Other specified abnormalities of plasma proteins: Secondary | ICD-10-CM | POA: Diagnosis not present

## 2020-09-25 DIAGNOSIS — Z79899 Other long term (current) drug therapy: Secondary | ICD-10-CM | POA: Diagnosis not present

## 2020-09-25 DIAGNOSIS — I132 Hypertensive heart and chronic kidney disease with heart failure and with stage 5 chronic kidney disease, or end stage renal disease: Secondary | ICD-10-CM | POA: Diagnosis not present

## 2020-09-25 DIAGNOSIS — Z20822 Contact with and (suspected) exposure to covid-19: Secondary | ICD-10-CM | POA: Diagnosis not present

## 2020-09-25 DIAGNOSIS — Z955 Presence of coronary angioplasty implant and graft: Secondary | ICD-10-CM | POA: Diagnosis not present

## 2020-09-25 DIAGNOSIS — Z992 Dependence on renal dialysis: Secondary | ICD-10-CM | POA: Diagnosis not present

## 2020-09-25 DIAGNOSIS — N186 End stage renal disease: Secondary | ICD-10-CM | POA: Diagnosis not present

## 2020-09-25 DIAGNOSIS — I25110 Atherosclerotic heart disease of native coronary artery with unstable angina pectoris: Secondary | ICD-10-CM | POA: Diagnosis not present

## 2020-09-25 DIAGNOSIS — E785 Hyperlipidemia, unspecified: Secondary | ICD-10-CM | POA: Diagnosis not present

## 2020-09-25 DIAGNOSIS — I5042 Chronic combined systolic (congestive) and diastolic (congestive) heart failure: Secondary | ICD-10-CM | POA: Diagnosis not present

## 2020-09-25 DIAGNOSIS — Z87891 Personal history of nicotine dependence: Secondary | ICD-10-CM | POA: Diagnosis not present

## 2020-09-25 DIAGNOSIS — R079 Chest pain, unspecified: Secondary | ICD-10-CM | POA: Diagnosis not present

## 2020-09-25 DIAGNOSIS — I25118 Atherosclerotic heart disease of native coronary artery with other forms of angina pectoris: Secondary | ICD-10-CM | POA: Diagnosis not present

## 2020-09-25 LAB — RENAL FUNCTION PANEL
Albumin: 2.8 g/dL — ABNORMAL LOW (ref 3.5–5.0)
Anion gap: 18 — ABNORMAL HIGH (ref 5–15)
BUN: 73 mg/dL — ABNORMAL HIGH (ref 8–23)
CO2: 23 mmol/L (ref 22–32)
Calcium: 8.7 mg/dL — ABNORMAL LOW (ref 8.9–10.3)
Chloride: 96 mmol/L — ABNORMAL LOW (ref 98–111)
Creatinine, Ser: 14.61 mg/dL — ABNORMAL HIGH (ref 0.61–1.24)
GFR, Estimated: 3 mL/min — ABNORMAL LOW (ref >60)
Glucose, Bld: 85 mg/dL (ref 70–99)
Phosphorus: 8.8 mg/dL — ABNORMAL HIGH (ref 2.5–4.6)
Potassium: 5.1 mmol/L (ref 3.5–5.1)
Sodium: 137 mmol/L (ref 135–145)

## 2020-09-25 LAB — CBC
HCT: 32.1 % — ABNORMAL LOW (ref 39.0–52.0)
Hemoglobin: 10.9 g/dL — ABNORMAL LOW (ref 13.0–17.0)
MCH: 31.5 pg (ref 26.0–34.0)
MCHC: 34 g/dL (ref 30.0–36.0)
MCV: 92.8 fL (ref 80.0–100.0)
Platelets: 213 10*3/uL (ref 150–400)
RBC: 3.46 MIL/uL — ABNORMAL LOW (ref 4.22–5.81)
RDW: 16.8 % — ABNORMAL HIGH (ref 11.5–15.5)
WBC: 7.2 10*3/uL (ref 4.0–10.5)
nRBC: 0 % (ref 0.0–0.2)

## 2020-09-25 MED ORDER — LIDOCAINE-PRILOCAINE 2.5-2.5 % EX CREA: 1.0000 "application " | TOPICAL_CREAM | CUTANEOUS | Status: DC | PRN

## 2020-09-25 MED ORDER — HEPARIN SODIUM (PORCINE) 1000 UNIT/ML DIALYSIS: 2400.0000 [IU] | Freq: Once | INTRAMUSCULAR | Status: AC

## 2020-09-25 MED ORDER — HEPARIN SODIUM (PORCINE) 1000 UNIT/ML IJ SOLN
INTRAMUSCULAR | Status: AC
  Administered 2020-09-25: 2400 [IU] via INTRAVENOUS_CENTRAL
  Filled 2020-09-25: qty 3

## 2020-09-25 MED ORDER — SODIUM CHLORIDE 0.9 % IV SOLN: 100.0000 mL | INTRAVENOUS | Status: DC | PRN

## 2020-09-25 MED ORDER — NITROGLYCERIN 0.4 MG SL SUBL
0.4000 mg | SUBLINGUAL_TABLET | SUBLINGUAL | Status: DC | PRN
  Administered 2020-09-25 (×2): 0.4 mg via SUBLINGUAL

## 2020-09-25 MED ORDER — LIDOCAINE HCL (PF) 1 % IJ SOLN: 5.0000 mL | INTRAMUSCULAR | Status: DC | PRN

## 2020-09-25 MED ORDER — ALTEPLASE 2 MG IJ SOLR: 2.0000 mg | Freq: Once | INTRAMUSCULAR | Status: DC | PRN

## 2020-09-25 MED ORDER — CINACALCET HCL 30 MG PO TABS
30.0000 mg | ORAL_TABLET | ORAL | Status: DC
  Administered 2020-09-25: 30 mg via ORAL
  Filled 2020-09-25: qty 1

## 2020-09-25 MED ORDER — NITROGLYCERIN 0.4 MG SL SUBL
SUBLINGUAL_TABLET | SUBLINGUAL | Status: AC
  Filled 2020-09-25: qty 1

## 2020-09-25 MED ORDER — PENTAFLUOROPROP-TETRAFLUOROETH EX AERO: 1.0000 "application " | INHALATION_SPRAY | CUTANEOUS | Status: DC | PRN

## 2020-09-25 MED ORDER — CALCITRIOL 0.5 MCG PO CAPS
2.2500 ug | ORAL_CAPSULE | ORAL | Status: DC
  Administered 2020-09-25: 2.25 ug via ORAL
  Filled 2020-09-25: qty 4

## 2020-09-25 MED ORDER — HEPARIN SODIUM (PORCINE) 1000 UNIT/ML DIALYSIS: 1000.0000 [IU] | INTRAMUSCULAR | Status: DC | PRN

## 2020-09-25 MED ORDER — CHLORHEXIDINE GLUCONATE CLOTH 2 % EX PADS: 6.0000 | MEDICATED_PAD | Freq: Every day | CUTANEOUS | Status: DC

## 2020-09-25 NOTE — Progress Notes (Signed)
Report called to dialysis RN.  

## 2020-09-25 NOTE — Progress Notes (Signed)
Progress Note    Kenneth [name]  ZOX:[id number] DOB: 17-Jul-1956  DOA: 09/24/2020 PCP: [name], [name], MD    Brief Narrative:     Medical records reviewed and are as summarized below:  Kenneth [name] is an 64 y.o. male with medical history significant of CAD with PEA arrest in 2019; HTN; ETOH abuse; ESRD on TTS HD; afib; and chronic combined CHF presenting with chest pain.  Cath in 04/2020 with severe multivessel CAD with medical management.  He was previously admitted form 11/26-28 for CP thought to be related to severe HTN; Amlodipine and Imdur were increased with resolution of CP.  He reports that chest pain was gone when he went home.  He went home and ate chili with beans and the chest pain recurred in the left upper chest.  No nausea.  It was worse with lying down, nothing made it better until he took NTG and then it resolved after taking 2 NTG.  +SOB.  Pain was similar to prior index symptoms.  Last HD was Saturday, no issues.  Await cards recommendations.  Assessment/Plan:   Principal Problem:   Chest pain of uncertain etiology Active Problems:   Chronic diastolic CHF (congestive heart failure) (HCC)   ESRD on hemodialysis ([place])   Accelerated hypertension   Dyslipidemia   Chest pain with h/o multivessel CAD -Patient with prior cath in 04/2020 showing severe multivessel disease, but he was not thought to be a cath candidate -He was admitted from 11/26-28 for chest pain which was attributed to uncontrolled HTN -His pain resolved with increased doses of Amlodipine and Imdur -At home last night, his pain recurred -It resolved with NTG x 2 -2/3 typical symptoms suggestive of atypical cardiac chest pain, similar to prior index symptoms -CXR unremarkable.  -Initial cardiac HS troponin mildly elevated compared to prior (but ESRD patient with increased creatinine as well), negative Delta -EKG not indicative of acute ischemia.  -Continue ASA 81 mg daily, Plavix, Imdur  (cardiology increased dose during last hospitalization) -Cardiology consultation - await recommendations  HTN -Recent increase in amlopidine dose to 10 mg daily -Also on Toprol XL -Consider addition of ACE/ARB, as HD patients tend to respond very well to ACE/ARB therapy  HLD -Continue Lipitor 80 -Lipids were checked on 11/26 (TC 167, HDL 44, LDL 109, TG 69) so will not repeat at this time -LDL goal is <70,   Chronic diastolic CHF  -05/1190 echo with preserved EF and grade 1 diastolic dysfunction -Volume status is maintained with HD  ESRD on HD -Patient on chronic TTS HD -Nephrology prn order set utilized -HD due today -nephrology aware    Family Communication/Anticipated D/C date and plan/Code Status   DVT prophylaxis: heparin Code Status: Full Code.  Disposition Plan: Status is: Observation  The patient will require care spanning > 2 midnights and should be moved to inpatient because: Inpatient level of care appropriate due to severity of illness  Dispo: The patient is from: Home              Anticipated d/c is to: Home              Anticipated d/c date is: 1 day              Patient currently is not medically stable to d/c.         Medical Consultants:    Cards  nephrology  Subjective:   No current chest pain Chest pain last PM  Objective:  Vitals:   09/25/20 0127 09/25/20 0423 09/25/20 0803 09/25/20 1200  BP: (!) 153/89 (!) 162/82 (!) 179/86 (!) 141/70  Pulse: 72 82 71 80  Resp: 18 18 18 18   Temp: 98.3 F (36.8 C) 97.6 F (36.4 C) 98 F (36.7 C) 98.1 F (36.7 C)  TempSrc: Oral Oral Oral Oral  SpO2: 96% 95% 96% 95%  Weight:      Height:        Intake/Output Summary (Last 24 hours) at 09/25/2020 1204 Last data filed at 09/25/2020 7408 Gross per 24 hour  Intake 600 ml  Output --  Net 600 ml   Filed Weights   09/24/20 1600  Weight: 86.9 kg    Exam:  General: Appearance:     Overweight male in no acute distress     Lungs:      respirations unlabored  Heart:    Normal heart rate.  No murmurs, rubs, or gallops.   MS:   All extremities are intact.   Neurologic:   Awake, alert, withdrawn    Data Reviewed:   I have personally reviewed following labs and imaging studies:  Labs: Labs show the following:   Basic Metabolic Panel: Recent Labs  Lab 09/21/20 1708 09/21/20 1708 09/23/20 0418 09/24/20 0904  NA 142  --  139 139  K 4.4   < > 4.0 5.2*  CL 95*  --  97* 97*  CO2 30  --  26 24  GLUCOSE 96  --  104* 90  BUN 63*  --  34* 53*  CREATININE 11.00*  --  8.82* 11.77*  CALCIUM 9.5  --  8.3* 9.1  MG  --   --  1.8  --    < > = values in this interval not displayed.   GFR Estimated Creatinine Clearance: 6.8 mL/min (A) (by C-G formula based on SCr of 11.77 mg/dL (H)). Liver Function Tests: Recent Labs  Lab 09/21/20 1708  AST 12*  ALT 9  ALKPHOS 55  BILITOT 0.6  PROT 7.4  ALBUMIN 3.2*   No results for input(s): LIPASE, AMYLASE in the last 168 hours. No results for input(s): AMMONIA in the last 168 hours. Coagulation profile No results for input(s): INR, PROTIME in the last 168 hours.  CBC: Recent Labs  Lab 09/21/20 1708 09/23/20 0418 09/24/20 0904  WBC 6.6 6.1 6.7  NEUTROABS  --  2.9  --   HGB 11.3* 11.4* 11.9*  HCT 33.9* 32.8* 36.3*  MCV 96.0 92.1 96.0  PLT 204 192 212   Cardiac Enzymes: No results for input(s): CKTOTAL, CKMB, CKMBINDEX, TROPONINI in the last 168 hours. BNP (last 3 results) No results for input(s): PROBNP in the last 8760 hours. CBG: No results for input(s): GLUCAP in the last 168 hours. D-Dimer: No results for input(s): DDIMER in the last 72 hours. Hgb A1c: No results for input(s): HGBA1C in the last 72 hours. Lipid Profile: No results for input(s): CHOL, HDL, LDLCALC, TRIG, CHOLHDL, LDLDIRECT in the last 72 hours. Thyroid function studies: No results for input(s): TSH, T4TOTAL, T3FREE, THYROIDAB in the last 72 hours.  Invalid input(s): FREET3 Anemia work  up: No results for input(s): VITAMINB12, FOLATE, FERRITIN, TIBC, IRON, RETICCTPCT in the last 72 hours. Sepsis Labs: Recent Labs  Lab 09/21/20 1708 09/23/20 0418 09/24/20 0904  WBC 6.6 6.1 6.7    Microbiology Recent Results (from the past 240 hour(s))  Resp Panel by RT-PCR (Flu A&B, Covid) Nasopharyngeal Swab     Status: None  Collection Time: 09/21/20  9:19 PM   Specimen: Nasopharyngeal Swab; Nasopharyngeal(NP) swabs in vial transport medium  Result Value Ref Range Status   SARS Coronavirus 2 by RT PCR NEGATIVE NEGATIVE Final    Comment: (NOTE) SARS-CoV-2 target nucleic acids are NOT DETECTED.  The SARS-CoV-2 RNA is generally detectable in upper respiratory specimens during the acute phase of infection. The lowest concentration of SARS-CoV-2 viral copies this assay can detect is 138 copies/mL. A negative result does not preclude SARS-Cov-2 infection and should not be used as the sole basis for treatment or other patient management decisions. A negative result may occur with  improper specimen collection/handling, submission of specimen other than nasopharyngeal swab, presence of viral mutation(s) within the areas targeted by this assay, and inadequate number of viral copies(<138 copies/mL). A negative result must be combined with clinical observations, patient history, and epidemiological information. The expected result is Negative.  Fact Sheet for Patients:  EntrepreneurPulse.com.au  Fact Sheet for Healthcare Providers:  IncredibleEmployment.be  This test is no t yet approved or cleared by the [place] FDA and  has been authorized for detection and/or diagnosis of SARS-CoV-2 by FDA under an Emergency Use Authorization (EUA). This EUA will remain  in effect (meaning this test can be used) for the duration of the COVID-19 declaration under Section 564(b)(1) of the Act, 21 U.S.C.section 360bbb-3(b)(1), unless the authorization is  terminated  or revoked sooner.       Influenza A by PCR NEGATIVE NEGATIVE Final   Influenza B by PCR NEGATIVE NEGATIVE Final    Comment: (NOTE) The Xpert Xpress SARS-CoV-2/FLU/RSV plus assay is intended as an aid in the diagnosis of influenza from Nasopharyngeal swab specimens and should not be used as a sole basis for treatment. Nasal washings and aspirates are unacceptable for Xpert Xpress SARS-CoV-2/FLU/RSV testing.  Fact Sheet for Patients: EntrepreneurPulse.com.au  Fact Sheet for Healthcare Providers: IncredibleEmployment.be  This test is not yet approved or cleared by the [place] FDA and has been authorized for detection and/or diagnosis of SARS-CoV-2 by FDA under an Emergency Use Authorization (EUA). This EUA will remain in effect (meaning this test can be used) for the duration of the COVID-19 declaration under Section 564(b)(1) of the Act, 21 U.S.C. section 360bbb-3(b)(1), unless the authorization is terminated or revoked.  Performed at [facility], [place] [address] [place], [place] [address]   MRSA PCR Screening     Status: None   Collection Time: 09/21/20 10:57 PM   Specimen: Nasal Mucosa; Nasopharyngeal  Result Value Ref Range Status   MRSA by PCR NEGATIVE NEGATIVE Final    Comment:        The GeneXpert MRSA Assay (FDA approved for NASAL specimens only), is one component of a comprehensive MRSA colonization surveillance program. It is not intended to diagnose MRSA infection nor to guide or monitor treatment for MRSA infections. Performed at [facility], [place] [address] [place], [place] [address]   Resp Panel by RT-PCR (Flu A&B, Covid) Nasopharyngeal Swab     Status: None   Collection Time: 09/24/20 11:34 AM   Specimen: Nasopharyngeal Swab; Nasopharyngeal(NP) swabs in vial transport medium  Result Value Ref Range Status   SARS Coronavirus 2 by RT PCR NEGATIVE NEGATIVE Final    Comment:  (NOTE) SARS-CoV-2 target nucleic acids are NOT DETECTED.  The SARS-CoV-2 RNA is generally detectable in upper respiratory specimens during the acute phase of infection. The lowest concentration of SARS-CoV-2 viral copies this assay can detect is  138 copies/mL. A negative result does not preclude SARS-Cov-2 infection and should not be used as the sole basis for treatment or other patient management decisions. A negative result may occur with  improper specimen collection/handling, submission of specimen other than nasopharyngeal swab, presence of viral mutation(s) within the areas targeted by this assay, and inadequate number of viral copies(<138 copies/mL). A negative result must be combined with clinical observations, patient history, and epidemiological information. The expected result is Negative.  Fact Sheet for Patients:  EntrepreneurPulse.com.au  Fact Sheet for Healthcare Providers:  IncredibleEmployment.be  This test is no t yet approved or cleared by the [place] FDA and  has been authorized for detection and/or diagnosis of SARS-CoV-2 by FDA under an Emergency Use Authorization (EUA). This EUA will remain  in effect (meaning this test can be used) for the duration of the COVID-19 declaration under Section 564(b)(1) of the Act, 21 U.S.C.section 360bbb-3(b)(1), unless the authorization is terminated  or revoked sooner.       Influenza A by PCR NEGATIVE NEGATIVE Final   Influenza B by PCR NEGATIVE NEGATIVE Final    Comment: (NOTE) The Xpert Xpress SARS-CoV-2/FLU/RSV plus assay is intended as an aid in the diagnosis of influenza from Nasopharyngeal swab specimens and should not be used as a sole basis for treatment. Nasal washings and aspirates are unacceptable for Xpert Xpress SARS-CoV-2/FLU/RSV testing.  Fact Sheet for Patients: EntrepreneurPulse.com.au  Fact Sheet for Healthcare  Providers: IncredibleEmployment.be  This test is not yet approved or cleared by the [place] FDA and has been authorized for detection and/or diagnosis of SARS-CoV-2 by FDA under an Emergency Use Authorization (EUA). This EUA will remain in effect (meaning this test can be used) for the duration of the COVID-19 declaration under Section 564(b)(1) of the Act, 21 U.S.C. section 360bbb-3(b)(1), unless the authorization is terminated or revoked.  Performed at [facility], [place] [address] [place], [place] [address]     Procedures and diagnostic studies:  DG Chest Portable 1 View  Result Date: 09/24/2020 CLINICAL DATA:  Left-sided chest pain. EXAM: PORTABLE CHEST 1 VIEW COMPARISON:  09/21/2020 and 09/02/2020 FINDINGS: Small densities at left lung base may represent atelectasis. Otherwise, the lungs are clear. Negative for pulmonary edema. Heart and mediastinum are within normal limits. Atherosclerotic calcifications in the thoracic aorta. Negative for pneumothorax. Bone structures are unremarkable. IMPRESSION: No acute cardiopulmonary disease. Electronically Signed   By: Markus Daft M.D.   On: 09/24/2020 08:30    Medications:   . amLODipine  10 mg Oral Daily  . aspirin EC  81 mg Oral Daily  . atorvastatin  80 mg Oral Daily  . calcitRIOL  2.25 mcg Oral Q T,Th,Sa-HD  . Chlorhexidine Gluconate Cloth  6 each Topical Q0600  . cinacalcet  30 mg Oral Q T,Th,Sa-HD  . clopidogrel  75 mg Oral Daily  . heparin  5,000 Units Subcutaneous Q8H  . isosorbide mononitrate  90 mg Oral Daily  . metoprolol succinate  25 mg Oral Once per day on Sun Tue Thu Sat   Continuous Infusions:   LOS: 0 days   Geradine Girt  [facility]   How to contact the [facility] Attending or Consulting provider [place] or covering provider during after hours [place], for this patient?  1. Check the care team in [facility] and look for a) attending/consulting TRH provider listed and b) the [facility] team  listed 2. Log into [email or web address] and use [place]'s universal password to access.  If you do not have the password, please contact the hospital operator. 3. Locate the [facility] provider you are looking for under [facility] and page to a number that you can be directly reached. 4. If you still have difficulty reaching the provider, please page the [facility] (Director on Call) for the Hospitalists listed on amion for assistance.  09/25/2020, 12:04 PM

## 2020-09-25 NOTE — Care Management Obs Status (Signed)
[facility] NOTIFICATION   Patient Details  Name: Kenneth [name] MRN: [id number] Date of Birth: 1956/09/29   Medicare Observation Status Notification Given:  Yes    Marilu Favre, RN 09/25/2020, 2:31 PM

## 2020-09-25 NOTE — Progress Notes (Signed)
   09/25/20 1752  Vitals  BP 96/63  MAP (mmHg) 72  BP Location Right Arm  BP Method Automatic  Patient Position (if appropriate) Lying  Pulse Rate 82  ECG Heart Rate 81  Resp 17  During Hemodialysis Assessment  Intra-Hemodialysis Comments Tx completed  Post-Hemodialysis Assessment  Rinseback Volume (mL) 250 mL  KECN 244 V  Dialyzer Clearance Lightly streaked  Duration of HD Treatment -hour(s) 3.5 hour(s)  Hemodialysis Intake (mL) 700 mL  UF Total -Machine (mL) 3369 mL  Net UF (mL) 2669 mL  Tolerated HD Treatment Yes  Post-Hemodialysis Comments tx complete-pt stable  AVG/AVF Arterial Site Held (minutes) 10 minutes  AVG/AVF Venous Site Held (minutes) 10 minutes  Fistula / Graft Left Upper arm Arteriovenous fistula  No Placement Date or Time found.   Placed prior to admission: Yes  Orientation: (c) Left  Access Location: Upper arm  Access Type: Arteriovenous fistula  Site Condition No complications  Fistula / Graft Assessment Present;Thrill;Bruit  Status Deaccessed;Flushed

## 2020-09-25 NOTE — Progress Notes (Signed)
Pt c/o chest pain 8/10. V/S stable. EKG done. Dr Tonie Griffith informed. Ntg SL given x 2 doses as ordered. Currently pt is chest pain free. Will continue to monitor pt.

## 2020-09-25 NOTE — Progress Notes (Signed)
Kenneth [name] is a 64 Y/O male on hemodialysis T,Th,S at [facility]. PMH of CAD H/O PEA Arrest, ETOH abuse, combined systolic HF. He has been admitted as observation patient for chest pain. So far work up unremarkable. We will order and manage hemodialysis for patient, will consult formally if patient status upgraded to in-patient.   HD orders: [facility] T,Th,S 4 hrs 180NRe 400/800 85 kg 2.0K/2.0 Ca AVF -Heparin 2400 units IV TIW -Sensipar 30 mg PO TIW -Calcitriol 2.25 mcg PO TIW   Juanell Fairly NP-C [facility] [phone]

## 2020-09-25 NOTE — Progress Notes (Signed)
Progress Note  Patient Name: Kenneth [name] Date of Encounter: 09/25/2020  [facility] Cardiologist: Peter [name], MD   Subjective   CP last night resolved.  He is asking about dialysis.  Inpatient Medications    Scheduled Meds: . heparin sodium (porcine)      . amLODipine  10 mg Oral Daily  . aspirin EC  81 mg Oral Daily  . atorvastatin  80 mg Oral Daily  . calcitRIOL  2.25 mcg Oral Q T,Th,Sa-HD  . Chlorhexidine Gluconate Cloth  6 each Topical Q0600  . cinacalcet  30 mg Oral Q T,Th,Sa-HD  . clopidogrel  75 mg Oral Daily  . heparin  2,400 Units Dialysis Once in dialysis  . heparin  5,000 Units Subcutaneous Q8H  . isosorbide mononitrate  90 mg Oral Daily  . metoprolol succinate  25 mg Oral Once per day on Sun Tue Thu Sat   Continuous Infusions: . sodium chloride    . sodium chloride     PRN Meds: sodium chloride, sodium chloride, acetaminophen **OR** acetaminophen, alteplase, calcium carbonate (dosed in mg elemental calcium), camphor-menthol **AND** hydrOXYzine, docusate sodium, feeding supplement (NEPRO CARB STEADY), heparin, lidocaine (PF), lidocaine-prilocaine, nitroGLYCERIN, ondansetron **OR** ondansetron (ZOFRAN) IV, pentafluoroprop-tetrafluoroeth, sorbitol, zolpidem   Vital Signs    Vitals:   09/25/20 0127 09/25/20 0423 09/25/20 0803 09/25/20 1200  BP: (!) 153/89 (!) 162/82 (!) 179/86 (!) 141/70  Pulse: 72 82 71 80  Resp: 18 18 18 18   Temp: 98.3 F (36.8 C) 97.6 F (36.4 C) 98 F (36.7 C) 98.1 F (36.7 C)  TempSrc: Oral Oral Oral Oral  SpO2: 96% 95% 96% 95%  Weight:      Height:        Intake/Output Summary (Last 24 hours) at 09/25/2020 1307 Last data filed at 09/25/2020 0739 Gross per 24 hour  Intake 600 ml  Output --  Net 600 ml   Last 3 Weights 09/24/2020 09/23/2020 09/22/2020  Weight (lbs) 191 lb 9.3 oz 191 lb 9.6 oz 190 lb 11.2 oz  Weight (kg) 86.9 kg 86.909 kg 86.5 kg      Telemetry    NSR - Personally Reviewed  ECG    NSR,  nonspecific ST changes, likely associated with LVH - Personally Reviewed; no change during chest pain  Physical Exam   GEN: No acute distress.   Neck: No JVD Cardiac: RRR, no murmurs, rubs, or gallops.  Respiratory: Clear to auscultation bilaterally. GI: Soft, nontender, non-distended  MS: No edema; No deformity. Left arm dialysis fistula Neuro:  Nonfocal  Psych: Normal affect   Labs    High Sensitivity Troponin:   Recent Labs  Lab 09/22/20 0238 09/22/20 0435 09/22/20 0620 09/24/20 0904 09/24/20 1134  TROPONINIHS 44* 47* 53* 112* 116*      Chemistry Recent Labs  Lab 09/21/20 1708 09/23/20 0418 09/24/20 0904  NA 142 139 139  K 4.4 4.0 5.2*  CL 95* 97* 97*  CO2 30 26 24   GLUCOSE 96 104* 90  BUN 63* 34* 53*  CREATININE 11.00* 8.82* 11.77*  CALCIUM 9.5 8.3* 9.1  PROT 7.4  --   --   ALBUMIN 3.2*  --   --   AST 12*  --   --   ALT 9  --   --   ALKPHOS 55  --   --   BILITOT 0.6  --   --   GFRNONAA 5* 6* 4*  ANIONGAP 17* 16* 18*     Hematology Recent Labs  Lab 09/21/20 1708 09/23/20 0418 09/24/20 0904  WBC 6.6 6.1 6.7  RBC 3.53* 3.56* 3.78*  HGB 11.3* 11.4* 11.9*  HCT 33.9* 32.8* 36.3*  MCV 96.0 92.1 96.0  MCH 32.0 32.0 31.5  MCHC 33.3 34.8 32.8  RDW 17.2* 17.2* 17.3*  PLT 204 192 212    BNPNo results for input(s): BNP, PROBNP in the last 168 hours.   DDimer No results for input(s): DDIMER in the last 168 hours.   Radiology    DG Chest Portable 1 View  Result Date: 09/24/2020 CLINICAL DATA:  Left-sided chest pain. EXAM: PORTABLE CHEST 1 VIEW COMPARISON:  09/21/2020 and 09/02/2020 FINDINGS: Small densities at left lung base may represent atelectasis. Otherwise, the lungs are clear. Negative for pulmonary edema. Heart and mediastinum are within normal limits. Atherosclerotic calcifications in the thoracic aorta. Negative for pneumothorax. Bone structures are unremarkable. IMPRESSION: No acute cardiopulmonary disease. Electronically Signed   By: Markus Daft M.D.   On: 09/24/2020 08:30    Cardiac Studies   Cath films reviewed previously  Patient Profile     64 y.o. male with ESRD, CAD  Assessment & Plan    Complex CAD: Pain free now.  Continue with plans for amlodipine and Imdur.  Not a candidate for PCI and he was turned down for CABG.  Prior palliative care consult had been performed, which seems reasonable.      For questions or updates, please contact [place] Please consult [email or web address] for contact info under        Signed, Larae Grooms, MD  09/25/2020, 1:07 PM

## 2020-09-25 NOTE — TOC Initial Note (Signed)
Transition of Care [facility]) - Initial/Assessment Note    Patient Details  Name: Kenneth [name] MRN: [id number] Date of Birth: 1956/03/20  Transition of [facility]) CM/SW Contact:    Marilu Favre, RN Phone Number: 09/25/2020, 2:33 PM  Clinical Narrative:                 Confirmed face sheet information. Patient from home with daughter. Does not use any DME prior to admission.   PCP is Dr Peter [name]   Expected Discharge Plan: Home/Self Care     Patient Goals and CMS Choice Patient states their goals for this hospitalization and ongoing recovery are:: to return to home CMS Medicare.gov Compare Post Acute Care list provided to:: Patient Choice offered to / list presented to : NA  Expected Discharge Plan and Services Expected Discharge Plan: Home/Self Care   Discharge Planning Services: CM Consult   Living arrangements for the past 2 months: Apartment                 DME Arranged: N/A         HH Arranged: NA          Prior Living Arrangements/Services Living arrangements for the past 2 months: Apartment Lives with:: Adult Children Patient language and need for interpreter reviewed:: Yes Do you feel safe going back to the place where you live?: Yes      Need for Family Participation in Patient Care: Yes (Comment) Care giver support system in place?: Yes (comment)   Criminal Activity/Legal Involvement Pertinent to Current Situation/Hospitalization: No - Comment as needed  Activities of Daily Living Home Assistive Devices/Equipment: None ADL Screening (condition at time of admission) Patient's cognitive ability adequate to safely complete daily activities?: Yes Is the patient deaf or have difficulty hearing?: No Does the patient have difficulty seeing, even when wearing glasses/contacts?: No Does the patient have difficulty concentrating, remembering, or making decisions?: No Patient able to express need for assistance with ADLs?: Yes Does the patient have  difficulty dressing or bathing?: No Independently performs ADLs?: Yes (appropriate for developmental age) Does the patient have difficulty walking or climbing stairs?: No Weakness of Legs: None Weakness of Arms/Hands: None  Permission Sought/Granted   Permission granted to share information with : No              Emotional Assessment   Attitude/Demeanor/Rapport: Engaged Affect (typically observed): Accepting Orientation: : Oriented to Self, Oriented to Place, Oriented to  Time, Oriented to Situation Alcohol / Substance Use: Not Applicable Psych Involvement: No (comment)  Admission diagnosis:  Elevated troponin [R77.8] Chest pain of uncertain etiology [W09.8] Chest pain, unspecified type [R07.9] Patient Active Problem List   Diagnosis Date Noted  . Chest pain of uncertain etiology 11/91/4782  . Accelerated hypertension 09/24/2020  . Dyslipidemia 09/24/2020  . NSTEMI (non-ST elevated myocardial infarction) ([place]) 05/12/2020  . Non-ST elevated myocardial infarction ([place]) 05/12/2020  . DNR (do not resuscitate) discussion   . Palliative care by specialist   . Goals of care, counseling/discussion   . Coronary artery disease involving native heart with angina pectoris ([place])   . STEMI (ST elevation myocardial infarction) ([place]) 09/17/2018  . Unstable angina ([place])   . ACS (acute coronary syndrome) ([place]) 09/01/2018  . ESRD (end stage renal disease) ([place]) 07/20/2018  . Acute respiratory failure ([place]) 05/11/2018  . Cardiac arrest ([place]) 03/09/2018  . Acute encephalopathy   . Acute on chronic respiratory failure ([place]) 02/16/2018  . Tobacco dependence 02/16/2018  .  Pulmonary edema 01/26/2018  . Acute respiratory failure with hypoxia ([place]) 01/26/2018  . Atrial fibrillation and flutter ([place])   . Shortness of breath   . Acute on chronic diastolic CHF (congestive heart failure) ([place])   . Hypothermia 10/08/2016  . ESRD on hemodialysis ([place]) 10/08/2016  . Fall 10/08/2016  . Syncope  10/07/2016  . Near syncope 07/07/2016  . Cardiomyopathy ([place]) 07/07/2016  . Ventricular tachycardia, non-sustained ([place]) 07/06/2016  . Hypoglycemia 07/06/2016  . CKD (chronic kidney disease) stage 5, GFR less than 15 ml/min (HCC)   . Alcoholism ([place]) 05/13/2016  . Hypertensive emergency 12/08/2013  . Cardiomyopathy secondary 10/09/2011  . Alcohol withdrawal delirium ([place]) 09/25/2011  . Chronic diastolic CHF (congestive heart failure) ([place]) 09/24/2011   PCP:  [name], [name], MD Pharmacy:   [place] [place] [address] Phone: [phone] Fax: [place], [place] [address] [place] [address] Phone: [phone] Fax: [phone]     Social Determinants of Health (SDOH) Interventions    Readmission Risk Interventions No flowsheet data found.

## 2020-09-25 NOTE — Discharge Summary (Signed)
Physician Discharge Summary  Kenneth [name] QIO:[id number] DOB: 02/14/1956 DOA: 09/24/2020  PCP: [name], [name], MD  Admit date: 09/24/2020 Discharge date: 09/25/2020  Admitted From: home Discharge disposition: home   Recommendations for Outpatient Follow-Up:   1. Continue palliative care talks   Discharge Diagnosis:   Principal Problem:   Chest pain of uncertain etiology Active Problems:   Chronic diastolic CHF (congestive heart failure) (HCC)   ESRD on hemodialysis ([place])   Accelerated hypertension   Dyslipidemia    Discharge Condition: Improved.  Diet recommendation: renal  Wound care: None.  Code status: per MOST form DNR   History of Present Illness:   Kenneth [name] is a 64 y.o. male with medical history significant of CAD with PEA arrest in 2019; HTN; ETOH abuse; ESRD on TTS HD; afib; and chronic combined CHF presenting with chest pain.  Cath in 04/2020 with severe multivessel CAD with medical management.  He was previously admitted form 11/26-28 for CP thought to be related to severe HTN; Amlodipine and Imdur were increased with resolution of CP.  He reports that chest pain was gone when he went home.  He went home and ate chili with beans and the chest pain recurred in the left upper chest.  No nausea.  It was worse with lying down, nothing made it better until he took NTG and then it resolved after taking 2 NTG.  +SOB.  Pain was similar to prior index symptoms.  Last HD was Saturday, no issues.   Hospital Course by Problem:   Chest pain -per cards: Complex CAD: Pain free now.  Continue with plans for amlodipine and Imdur.  Not a candidate for PCI and he was turned down for CABG.   ESRD -continue HD (last time was 11/30)  HTN -Recent increase in amlopidine dose to 10 mg daily -Also on Toprol XL -Consider addition of ACE/ARB, as HD patients tend to respond very well to ACE/ARB therapy  HLD -Continue Lipitor80 -Lipids were checkedon 11/26(TC  167, HDL 44, LDL109, TG69) so will not repeat at this time -LDL goal is <70,   Chronic diastolic CHF -12/2438 echo with preserved EF and grade 1 diastolic dysfunction -Volume status is maintained with HD  Medical Consultants:   Cards Renal    Discharge Exam:   Vitals:   09/25/20 0803 09/25/20 1200  BP: (!) 179/86 (!) 141/70  Pulse: 71 80  Resp: 18 18  Temp: 98 F (36.7 C) 98.1 F (36.7 C)  SpO2: 96% 95%   Vitals:   09/25/20 0127 09/25/20 0423 09/25/20 0803 09/25/20 1200  BP: (!) 153/89 (!) 162/82 (!) 179/86 (!) 141/70  Pulse: 72 82 71 80  Resp: 18 18 18 18   Temp: 98.3 F (36.8 C) 97.6 F (36.4 C) 98 F (36.7 C) 98.1 F (36.7 C)  TempSrc: Oral Oral Oral Oral  SpO2: 96% 95% 96% 95%  Weight:      Height:        General exam: Appears calm and comfortable.  The results of significant diagnostics from this hospitalization (including imaging, microbiology, ancillary and laboratory) are listed below for reference.     Procedures and Diagnostic Studies:   DG Chest Portable 1 View  Result Date: 09/24/2020 CLINICAL DATA:  Left-sided chest pain. EXAM: PORTABLE CHEST 1 VIEW COMPARISON:  09/21/2020 and 09/02/2020 FINDINGS: Small densities at left lung base may represent atelectasis. Otherwise, the lungs are clear. Negative for pulmonary edema. Heart and mediastinum are within normal  limits. Atherosclerotic calcifications in the thoracic aorta. Negative for pneumothorax. Bone structures are unremarkable. IMPRESSION: No acute cardiopulmonary disease. Electronically Signed   By: Markus Daft M.D.   On: 09/24/2020 08:30     Labs:   Basic Metabolic Panel: Recent Labs  Lab 09/21/20 1708 09/21/20 1708 09/23/20 0418 09/24/20 0904  NA 142  --  139 139  K 4.4   < > 4.0 5.2*  CL 95*  --  97* 97*  CO2 30  --  26 24  GLUCOSE 96  --  104* 90  BUN 63*  --  34* 53*  CREATININE 11.00*  --  8.82* 11.77*  CALCIUM 9.5  --  8.3* 9.1  MG  --   --  1.8  --    < > = values in  this interval not displayed.   GFR Estimated Creatinine Clearance: 6.8 mL/min (A) (by C-G formula based on SCr of 11.77 mg/dL (H)). Liver Function Tests: Recent Labs  Lab 09/21/20 1708  AST 12*  ALT 9  ALKPHOS 55  BILITOT 0.6  PROT 7.4  ALBUMIN 3.2*   No results for input(s): LIPASE, AMYLASE in the last 168 hours. No results for input(s): AMMONIA in the last 168 hours. Coagulation profile No results for input(s): INR, PROTIME in the last 168 hours.  CBC: Recent Labs  Lab 09/21/20 1708 09/23/20 0418 09/24/20 0904  WBC 6.6 6.1 6.7  NEUTROABS  --  2.9  --   HGB 11.3* 11.4* 11.9*  HCT 33.9* 32.8* 36.3*  MCV 96.0 92.1 96.0  PLT 204 192 212   Cardiac Enzymes: No results for input(s): CKTOTAL, CKMB, CKMBINDEX, TROPONINI in the last 168 hours. BNP: Invalid input(s): POCBNP CBG: No results for input(s): GLUCAP in the last 168 hours. D-Dimer No results for input(s): DDIMER in the last 72 hours. Hgb A1c No results for input(s): HGBA1C in the last 72 hours. Lipid Profile No results for input(s): CHOL, HDL, LDLCALC, TRIG, CHOLHDL, LDLDIRECT in the last 72 hours. Thyroid function studies No results for input(s): TSH, T4TOTAL, T3FREE, THYROIDAB in the last 72 hours.  Invalid input(s): FREET3 Anemia work up No results for input(s): VITAMINB12, FOLATE, FERRITIN, TIBC, IRON, RETICCTPCT in the last 72 hours. Microbiology Recent Results (from the past 240 hour(s))  Resp Panel by RT-PCR (Flu A&B, Covid) Nasopharyngeal Swab     Status: None   Collection Time: 09/21/20  9:19 PM   Specimen: Nasopharyngeal Swab; Nasopharyngeal(NP) swabs in vial transport medium  Result Value Ref Range Status   SARS Coronavirus 2 by RT PCR NEGATIVE NEGATIVE Final    Comment: (NOTE) SARS-CoV-2 target nucleic acids are NOT DETECTED.  The SARS-CoV-2 RNA is generally detectable in upper respiratory specimens during the acute phase of infection. The lowest concentration of SARS-CoV-2 viral copies this  assay can detect is 138 copies/mL. A negative result does not preclude SARS-Cov-2 infection and should not be used as the sole basis for treatment or other patient management decisions. A negative result may occur with  improper specimen collection/handling, submission of specimen other than nasopharyngeal swab, presence of viral mutation(s) within the areas targeted by this assay, and inadequate number of viral copies(<138 copies/mL). A negative result must be combined with clinical observations, patient history, and epidemiological information. The expected result is Negative.  Fact Sheet for Patients:  EntrepreneurPulse.com.au  Fact Sheet for Healthcare Providers:  IncredibleEmployment.be  This test is no t yet approved or cleared by the [place] and  has been authorized for  detection and/or diagnosis of SARS-CoV-2 by FDA under an Emergency Use Authorization (EUA). This EUA will remain  in effect (meaning this test can be used) for the duration of the COVID-19 declaration under Section 564(b)(1) of the Act, 21 U.S.C.section 360bbb-3(b)(1), unless the authorization is terminated  or revoked sooner.       Influenza A by PCR NEGATIVE NEGATIVE Final   Influenza B by PCR NEGATIVE NEGATIVE Final    Comment: (NOTE) The Xpert Xpress SARS-CoV-2/FLU/RSV plus assay is intended as an aid in the diagnosis of influenza from Nasopharyngeal swab specimens and should not be used as a sole basis for treatment. Nasal washings and aspirates are unacceptable for Xpert Xpress SARS-CoV-2/FLU/RSV testing.  Fact Sheet for Patients: EntrepreneurPulse.com.au  Fact Sheet for Healthcare Providers: IncredibleEmployment.be  This test is not yet approved or cleared by the [place] FDA and has been authorized for detection and/or diagnosis of SARS-CoV-2 by FDA under an Emergency Use Authorization (EUA). This EUA will  remain in effect (meaning this test can be used) for the duration of the COVID-19 declaration under Section 564(b)(1) of the Act, 21 U.S.C. section 360bbb-3(b)(1), unless the authorization is terminated or revoked.  Performed at [facility], [place] [address], [place], [place] [address]   MRSA PCR Screening     Status: None   Collection Time: 09/21/20 10:57 PM   Specimen: Nasal Mucosa; Nasopharyngeal  Result Value Ref Range Status   MRSA by PCR NEGATIVE NEGATIVE Final    Comment:        The GeneXpert MRSA Assay (FDA approved for NASAL specimens only), is one component of a comprehensive MRSA colonization surveillance program. It is not intended to diagnose MRSA infection nor to guide or monitor treatment for MRSA infections. Performed at [facility], [place] [address], [place], [place] [address]   Resp Panel by RT-PCR (Flu A&B, Covid) Nasopharyngeal Swab     Status: None   Collection Time: 09/24/20 11:34 AM   Specimen: Nasopharyngeal Swab; Nasopharyngeal(NP) swabs in vial transport medium  Result Value Ref Range Status   SARS Coronavirus 2 by RT PCR NEGATIVE NEGATIVE Final    Comment: (NOTE) SARS-CoV-2 target nucleic acids are NOT DETECTED.  The SARS-CoV-2 RNA is generally detectable in upper respiratory specimens during the acute phase of infection. The lowest concentration of SARS-CoV-2 viral copies this assay can detect is 138 copies/mL. A negative result does not preclude SARS-Cov-2 infection and should not be used as the sole basis for treatment or other patient management decisions. A negative result may occur with  improper specimen collection/handling, submission of specimen other than nasopharyngeal swab, presence of viral mutation(s) within the areas targeted by this assay, and inadequate number of viral copies(<138 copies/mL). A negative result must be combined with clinical observations, patient history, and epidemiological information. The expected  result is Negative.  Fact Sheet for Patients:  EntrepreneurPulse.com.au  Fact Sheet for Healthcare Providers:  IncredibleEmployment.be  This test is no t yet approved or cleared by the [place] FDA and  has been authorized for detection and/or diagnosis of SARS-CoV-2 by FDA under an Emergency Use Authorization (EUA). This EUA will remain  in effect (meaning this test can be used) for the duration of the COVID-19 declaration under Section 564(b)(1) of the Act, 21 U.S.C.section 360bbb-3(b)(1), unless the authorization is terminated  or revoked sooner.       Influenza A by PCR NEGATIVE NEGATIVE Final   Influenza B by PCR NEGATIVE NEGATIVE Final  Comment: (NOTE) The Xpert Xpress SARS-CoV-2/FLU/RSV plus assay is intended as an aid in the diagnosis of influenza from Nasopharyngeal swab specimens and should not be used as a sole basis for treatment. Nasal washings and aspirates are unacceptable for Xpert Xpress SARS-CoV-2/FLU/RSV testing.  Fact Sheet for Patients: EntrepreneurPulse.com.au  Fact Sheet for Healthcare Providers: IncredibleEmployment.be  This test is not yet approved or cleared by the [place] FDA and has been authorized for detection and/or diagnosis of SARS-CoV-2 by FDA under an Emergency Use Authorization (EUA). This EUA will remain in effect (meaning this test can be used) for the duration of the COVID-19 declaration under Section 564(b)(1) of the Act, 21 U.S.C. section 360bbb-3(b)(1), unless the authorization is terminated or revoked.  Performed at [facility], [place] [address] [place], [place] [address]      Discharge Instructions:   Discharge Instructions    Discharge instructions   Complete by: As directed    Renal diet   Increase activity slowly   Complete by: As directed    No dressing needed   Complete by: As directed    No wound care   Complete by: As  directed      Allergies as of 09/25/2020   No Known Allergies     Medication List    TAKE these medications   amLODipine 10 MG tablet Commonly known as: NORVASC Take 1 tablet (10 mg total) by mouth daily.   aspirin 81 MG EC tablet Take 1 tablet (81 mg total) by mouth daily. Swallow whole.   atorvastatin 80 MG tablet Commonly known as: LIPITOR Take 1 tablet (80 mg total) by mouth daily.   clopidogrel 75 MG tablet Commonly known as: PLAVIX Take 1 tablet by mouth once daily   isosorbide mononitrate 30 MG 24 hr tablet Commonly known as: IMDUR Take 3 tablets (90 mg total) by mouth daily.   metoprolol succinate 25 MG 24 hr tablet Commonly known as: TOPROL-XL Take 1 tablet (25 mg total) by mouth 4 (four) times a week. Take on Sunday, Monday, Wednesday, Friday   nitroGLYCERIN 0.4 MG SL tablet Commonly known as: NITROSTAT DISSOLVE ONE TABLET UNDER THE TONGUE EVERY 5 MINUTES AS NEEDED FOR CHEST PAIN.  DO NOT EXCEED A TOTAL OF 3 DOSES IN 15 MINUTES What changed: See the new instructions.   Tylenol PM Extra Strength 25-500 MG Tabs tablet Generic drug: diphenhydramine-acetaminophen Take 2 tablets by mouth at bedtime as needed (Pain, Sleep).            Discharge Care Instructions  (From admission, onward)         Start     Ordered   09/25/20 0000  No dressing needed        11 /30/21 [place]    [name], [name], MD Follow up in 1 month(s).   Specialty: Cardiology Contact information: [address] [place] [address] [phone]                Time coordinating discharge: 25 min  Signed:  Geradine Girt DO  [facility] 09/25/2020, 2:52 PM

## 2020-09-25 NOTE — Progress Notes (Signed)
Pt alert and oriented x4. Pt denied any complaints. Pt verbalized understanding of discharge instructions. Vitals WDL. Pt had no transportation to go home. Cab voucher used for patient. Pt escorted via wheelchair to main entrance by CNA. [employer] to transport home.

## 2020-09-25 NOTE — Progress Notes (Signed)
Patient off floor to dialysis

## 2020-09-25 NOTE — Progress Notes (Signed)
Patient back from dialysis in NAD and free from pain. I called and spoke with his daughter [name] and informed her that Kenneth [name] is being discharged. She does not have transportation to come pick him up and said we usually send him home in a cab.

## 2020-09-26 ENCOUNTER — Telehealth: Payer: Self-pay | Admitting: Nephrology

## 2020-09-26 DIAGNOSIS — Z992 Dependence on renal dialysis: Secondary | ICD-10-CM | POA: Diagnosis not present

## 2020-09-26 DIAGNOSIS — I129 Hypertensive chronic kidney disease with stage 1 through stage 4 chronic kidney disease, or unspecified chronic kidney disease: Secondary | ICD-10-CM | POA: Diagnosis not present

## 2020-09-26 DIAGNOSIS — N186 End stage renal disease: Secondary | ICD-10-CM | POA: Diagnosis not present

## 2020-09-26 NOTE — Telephone Encounter (Signed)
Transition of Care Contact from [place]  Date of Discharge: 09/25/20 Date of Contact: 09/26/20 Method of contact: phone - attempted  Attempted to contact patient to discuss transition of care from inpatient admission.  Patient did not answer the phone.  Message was left on patient's voicemail informing them we would attempt to call them again and if unable to reach will follow up at dialysis.  Jen Mow, PA-C [facility] Pager: [phone]

## 2020-09-27 DIAGNOSIS — Z992 Dependence on renal dialysis: Secondary | ICD-10-CM | POA: Diagnosis not present

## 2020-09-27 DIAGNOSIS — D631 Anemia in chronic kidney disease: Secondary | ICD-10-CM | POA: Diagnosis not present

## 2020-09-27 DIAGNOSIS — N186 End stage renal disease: Secondary | ICD-10-CM | POA: Diagnosis not present

## 2020-09-27 DIAGNOSIS — N2581 Secondary hyperparathyroidism of renal origin: Secondary | ICD-10-CM | POA: Diagnosis not present

## 2020-09-29 ENCOUNTER — Emergency Department (HOSPITAL_COMMUNITY)
Admission: EM | Admit: 2020-09-29 | Discharge: 2020-09-29 | Disposition: A | Discharge status: Home / Self Care | Payer: Medicare Other | Source: Home / Self Care | Attending: Emergency Medicine | Admitting: Emergency Medicine

## 2020-09-29 ENCOUNTER — Other Ambulatory Visit: Payer: Self-pay

## 2020-09-29 ENCOUNTER — Encounter (HOSPITAL_COMMUNITY): Payer: Self-pay | Admitting: Emergency Medicine

## 2020-09-29 ENCOUNTER — Emergency Department (HOSPITAL_COMMUNITY): Payer: Medicare Other

## 2020-09-29 ENCOUNTER — Emergency Department (HOSPITAL_COMMUNITY)
Admission: EM | Admit: 2020-09-29 | Discharge: 2020-09-30 | Disposition: A | Discharge status: Home / Self Care | Payer: Medicare Other | Source: Home / Self Care | Attending: Emergency Medicine | Admitting: Emergency Medicine

## 2020-09-29 DIAGNOSIS — I2510 Atherosclerotic heart disease of native coronary artery without angina pectoris: Secondary | ICD-10-CM | POA: Insufficient documentation

## 2020-09-29 DIAGNOSIS — R079 Chest pain, unspecified: Secondary | ICD-10-CM | POA: Diagnosis not present

## 2020-09-29 DIAGNOSIS — R0789 Other chest pain: Secondary | ICD-10-CM | POA: Insufficient documentation

## 2020-09-29 DIAGNOSIS — Z7901 Long term (current) use of anticoagulants: Secondary | ICD-10-CM | POA: Insufficient documentation

## 2020-09-29 DIAGNOSIS — Z79899 Other long term (current) drug therapy: Secondary | ICD-10-CM | POA: Insufficient documentation

## 2020-09-29 DIAGNOSIS — Z87891 Personal history of nicotine dependence: Secondary | ICD-10-CM | POA: Insufficient documentation

## 2020-09-29 DIAGNOSIS — I214 Non-ST elevation (NSTEMI) myocardial infarction: Secondary | ICD-10-CM | POA: Diagnosis not present

## 2020-09-29 DIAGNOSIS — N186 End stage renal disease: Secondary | ICD-10-CM | POA: Insufficient documentation

## 2020-09-29 DIAGNOSIS — I10 Essential (primary) hypertension: Secondary | ICD-10-CM

## 2020-09-29 DIAGNOSIS — I161 Hypertensive emergency: Secondary | ICD-10-CM | POA: Diagnosis not present

## 2020-09-29 DIAGNOSIS — I4891 Unspecified atrial fibrillation: Secondary | ICD-10-CM | POA: Insufficient documentation

## 2020-09-29 DIAGNOSIS — I132 Hypertensive heart and chronic kidney disease with heart failure and with stage 5 chronic kidney disease, or end stage renal disease: Secondary | ICD-10-CM | POA: Insufficient documentation

## 2020-09-29 DIAGNOSIS — Z992 Dependence on renal dialysis: Secondary | ICD-10-CM | POA: Insufficient documentation

## 2020-09-29 DIAGNOSIS — Z955 Presence of coronary angioplasty implant and graft: Secondary | ICD-10-CM | POA: Insufficient documentation

## 2020-09-29 DIAGNOSIS — I5042 Chronic combined systolic (congestive) and diastolic (congestive) heart failure: Secondary | ICD-10-CM | POA: Insufficient documentation

## 2020-09-29 DIAGNOSIS — Z66 Do not resuscitate: Secondary | ICD-10-CM | POA: Diagnosis not present

## 2020-09-29 DIAGNOSIS — I5043 Acute on chronic combined systolic (congestive) and diastolic (congestive) heart failure: Secondary | ICD-10-CM | POA: Insufficient documentation

## 2020-09-29 DIAGNOSIS — Z7982 Long term (current) use of aspirin: Secondary | ICD-10-CM | POA: Insufficient documentation

## 2020-09-29 DIAGNOSIS — Z20822 Contact with and (suspected) exposure to covid-19: Secondary | ICD-10-CM | POA: Diagnosis not present

## 2020-09-29 LAB — CBC
HCT: 33.8 % — ABNORMAL LOW (ref 39.0–52.0)
HCT: 34.7 % — ABNORMAL LOW (ref 39.0–52.0)
Hemoglobin: 11.3 g/dL — ABNORMAL LOW (ref 13.0–17.0)
Hemoglobin: 11.7 g/dL — ABNORMAL LOW (ref 13.0–17.0)
MCH: 31.7 pg (ref 26.0–34.0)
MCH: 31.9 pg (ref 26.0–34.0)
MCHC: 33.4 g/dL (ref 30.0–36.0)
MCHC: 33.7 g/dL (ref 30.0–36.0)
MCV: 94.7 fL (ref 80.0–100.0)
MCV: 94.6 fL (ref 80.0–100.0)
Platelets: 225 10*3/uL (ref 150–400)
Platelets: 240 10*3/uL (ref 150–400)
RBC: 3.57 MIL/uL — ABNORMAL LOW (ref 4.22–5.81)
RBC: 3.67 MIL/uL — ABNORMAL LOW (ref 4.22–5.81)
RDW: 17 % — ABNORMAL HIGH (ref 11.5–15.5)
RDW: 16.9 % — ABNORMAL HIGH (ref 11.5–15.5)
WBC: 8 10*3/uL (ref 4.0–10.5)
WBC: 7.2 10*3/uL (ref 4.0–10.5)
nRBC: 0 % (ref 0.0–0.2)
nRBC: 0 % (ref 0.0–0.2)

## 2020-09-29 LAB — BASIC METABOLIC PANEL
Anion gap: 20 — ABNORMAL HIGH (ref 5–15)
Anion gap: 21 — ABNORMAL HIGH (ref 5–15)
BUN: 67 mg/dL — ABNORMAL HIGH (ref 8–23)
BUN: 67 mg/dL — ABNORMAL HIGH (ref 8–23)
CO2: 23 mmol/L (ref 22–32)
CO2: 23 mmol/L (ref 22–32)
Calcium: 9.2 mg/dL (ref 8.9–10.3)
Calcium: 9.4 mg/dL (ref 8.9–10.3)
Chloride: 96 mmol/L — ABNORMAL LOW (ref 98–111)
Chloride: 95 mmol/L — ABNORMAL LOW (ref 98–111)
Creatinine, Ser: 14.39 mg/dL — ABNORMAL HIGH (ref 0.61–1.24)
Creatinine, Ser: 14.67 mg/dL — ABNORMAL HIGH (ref 0.61–1.24)
GFR, Estimated: 3 mL/min — ABNORMAL LOW (ref >60)
GFR, Estimated: 3 mL/min — ABNORMAL LOW (ref >60)
Glucose, Bld: 105 mg/dL — ABNORMAL HIGH (ref 70–99)
Glucose, Bld: 87 mg/dL (ref 70–99)
Potassium: 4.3 mmol/L (ref 3.5–5.1)
Potassium: 4.6 mmol/L (ref 3.5–5.1)
Sodium: 139 mmol/L (ref 135–145)
Sodium: 139 mmol/L (ref 135–145)

## 2020-09-29 LAB — TROPONIN I (HIGH SENSITIVITY)
Troponin I (High Sensitivity): 33 ng/L — ABNORMAL HIGH (ref <18)
Troponin I (High Sensitivity): 29 ng/L — ABNORMAL HIGH (ref <18)
Troponin I (High Sensitivity): 36 ng/L — ABNORMAL HIGH (ref <18)
Troponin I (High Sensitivity): 37 ng/L — ABNORMAL HIGH (ref <18)

## 2020-09-29 LAB — PROTIME-INR
INR: 1 (ref 0.8–1.2)
Prothrombin Time: 13.2 seconds (ref 11.4–15.2)

## 2020-09-29 NOTE — ED Notes (Signed)
E-signature pad unavailable at time of pt discharge. This RN discussed discharge materials with pt and answered all pt questions. Pt stated understanding of discharge material. ? ?

## 2020-09-29 NOTE — ED Provider Notes (Signed)
[facility] EMERGENCY DEPARTMENT Provider Note   CSN: [id number] Arrival date & time: 09/29/20  1142     History Chief Complaint  Patient presents with  . Chest Pain    Kenneth [name] is a 64 y.o. male with past medical history significant for afib, acute CHF, cardiomyopathy, chronic combined systolic and diastolic heart failure, ESRD on dialysis T/TH/S, hypertension.  HPI Patient presents emergency room today via EMS with chief complaint of chest pain.  He states around 9 AM this morning he had sudden onset of left-sided chest pain.  This was approximately 3 hours prior to arrival.  Patient states the pain has been intermittent.  He describes the pain as a throbbing sensation.  Pain does not radiate.  Pain is not worse with exertion.  He was given 2 nitro by EMS as well as aspirin and pain improved.  He missed dialysis today because of his chest pain, did complete his 2 sessions earlier this week. Denies dyspnea on exertion, SOB, chest tightness or pressure, radiation to left/right arm, jaw or back, nausea, or diaphoresis. Denies family history of cardiac disease.    Past Medical History:  Diagnosis Date  . Acute CHF ([place]) 01/2018  . Cardiomyopathy secondary    likely related to HTN heart disease; possibly ETOH related as well  . Chronic combined systolic and diastolic heart failure (HCC)    Echocardiogram 09/22/11: Moderate LVH, EF 72-53%, grade 3 diastolic dysfunction, mild MR, moderate to severe LAE, mild RVE, mild to moderate TR, small to moderate pericardial effusion  . Coronary artery disease    not felt to be candidate for CABG 08/2018; medical thearpy recomended due to high risk of PCI   . Dysrhythmia    aflutter 04/2016, afib 02/2018, not felt to be a candidate for anticoagulation due to non-compliance and ETOH  . ESRD (end stage renal disease) on dialysis ([place])    due to hypertensive nephrosclerosis; TTS; [address] (09/01/2018)  . History of alcohol abuse     . Hypertension   . Myocardial infarction [facility])    " mild " per daughter  . PEA (Pulseless electrical activity) ([place])    PEA arrest 03/09/18, treated empirically for hyperkalemia, shock x1 for WCT, given amiodarone, ROSC after 10 min of ACLS    Patient Active Problem List   Diagnosis Date Noted  . Chest pain of uncertain etiology 66/44/0347  . Accelerated hypertension 09/24/2020  . Dyslipidemia 09/24/2020  . NSTEMI (non-ST elevated myocardial infarction) ([place]) 05/12/2020  . Non-ST elevated myocardial infarction ([place]) 05/12/2020  . DNR (do not resuscitate) discussion   . Palliative care by specialist   . Goals of care, counseling/discussion   . Coronary artery disease involving native heart with angina pectoris ([place])   . STEMI (ST elevation myocardial infarction) ([place]) 09/17/2018  . Unstable angina ([place])   . ACS (acute coronary syndrome) ([place]) 09/01/2018  . ESRD (end stage renal disease) ([place]) 07/20/2018  . Acute respiratory failure ([place]) 05/11/2018  . Cardiac arrest ([place]) 03/09/2018  . Acute encephalopathy   . Acute on chronic respiratory failure ([place]) 02/16/2018  . Tobacco dependence 02/16/2018  . Pulmonary edema 01/26/2018  . Acute respiratory failure with hypoxia ([place]) 01/26/2018  . Atrial fibrillation and flutter ([place])   . Shortness of breath   . Acute on chronic diastolic CHF (congestive heart failure) ([place])   . Hypothermia 10/08/2016  . ESRD on hemodialysis ([place]) 10/08/2016  . Fall 10/08/2016  . Syncope 10/07/2016  . Near syncope  07/07/2016  . Cardiomyopathy ([place]) 07/07/2016  . Ventricular tachycardia, non-sustained ([place]) 07/06/2016  . Hypoglycemia 07/06/2016  . CKD (chronic kidney disease) stage 5, GFR less than 15 ml/min (HCC)   . Alcoholism ([place]) 05/13/2016  . Hypertensive emergency 12/08/2013  . Cardiomyopathy secondary 10/09/2011  . Alcohol withdrawal delirium ([place]) 09/25/2011  . Chronic diastolic CHF (congestive heart failure) ([place]) 09/24/2011    Past  Surgical History:  Procedure Laterality Date  . AV FISTULA PLACEMENT Left 05/14/2016   Procedure: LEFT ARM BASILIC VEIN TRANSPOSITION;  Surgeon: Rosetta Posner, MD;  Location: [place] [facility];  Service: Vascular;  Laterality: Left;  . INSERTION OF DIALYSIS CATHETER N/A 06/20/2019   Procedure: INSERTION OF TUNNELED  DIALYSIS CATHETER;  Surgeon: Elam Dutch, MD;  Location: [place];  Service: Vascular;  Laterality: N/A;  . LEFT HEART CATH AND CORONARY ANGIOGRAPHY N/A 09/02/2018   Procedure: LEFT HEART CATH AND CORONARY ANGIOGRAPHY;  Surgeon: Nelva Bush, MD;  Location: [place] CV LAB;  Service: Cardiovascular;  Laterality: N/A;  . LEFT HEART CATH AND CORONARY ANGIOGRAPHY N/A 05/14/2020   Procedure: LEFT HEART CATH AND CORONARY ANGIOGRAPHY;  Surgeon: Sherren Mocha, MD;  Location: [place] CV LAB;  Service: Cardiovascular;  Laterality: N/A;  . PERIPHERAL VASCULAR CATHETERIZATION N/A 05/13/2016   Procedure: Dialysis/Perma Catheter Insertion;  Surgeon: Serafina Mitchell, MD;  Location: [place] CV LAB;  Service: Cardiovascular;  Laterality: N/A;  . REVISION OF ARTERIOVENOUS GORETEX GRAFT Left 1/61/0960   Procedure: PLICATION OF ARTERIOVENOUS FISTULA LEFT ARM;  Surgeon: Elam Dutch, MD;  Location: [facility] OR;  Service: Vascular;  Laterality: Left;       Family History  Problem Relation Age of Onset  . Emphysema Mother   . Cirrhosis Father     Social History   Tobacco Use  . Smoking status: Former Smoker    Packs/day: 2.00    Years: 48.00    Pack years: 96.00    Types: Cigars    Quit date: 10/27/2017    Years since quitting: 2.9  . Smokeless tobacco: Former Systems developer    Types: Secondary school teacher  . Vaping Use: Never used  Substance Use Topics  . Alcohol use: Not Currently    Comment: h/o very heavy use, quit about 2019  . Drug use: No    Home Medications Prior to Admission medications   Medication Sig Start Date End Date Taking? Authorizing Provider  amLODipine (NORVASC) 10 MG tablet  Take 1 tablet (10 mg total) by mouth daily. 09/24/20   Dwyane Dee, MD  aspirin EC 81 MG EC tablet Take 1 tablet (81 mg total) by mouth daily. Swallow whole. 05/16/20   Alma Friendly, MD  atorvastatin (LIPITOR) 80 MG tablet Take 1 tablet (80 mg total) by mouth daily. 03/16/20   [name], [name], MD  clopidogrel (PLAVIX) 75 MG tablet Take 1 tablet by mouth once daily Patient taking differently: Take 75 mg by mouth daily.  05/17/20   Almyra Deforest, PA  diphenhydramine-acetaminophen (TYLENOL PM EXTRA STRENGTH) 25-500 MG TABS tablet Take 2 tablets by mouth at bedtime as needed (Pain, Sleep).  05/12/18   [provider]  isosorbide mononitrate (IMDUR) 30 MG 24 hr tablet Take 3 tablets (90 mg total) by mouth daily. 09/24/20   Dwyane Dee, MD  metoprolol succinate (TOPROL-XL) 25 MG 24 hr tablet Take 1 tablet (25 mg total) by mouth 4 (four) times a week. Take on Sunday, Monday, Wednesday, Friday 06/26/20   [name], [name], MD  nitroGLYCERIN (NITROSTAT) 0.4 MG SL tablet DISSOLVE ONE TABLET UNDER THE TONGUE EVERY 5 MINUTES AS NEEDED FOR CHEST PAIN.  DO NOT EXCEED A TOTAL OF 3 DOSES IN 15 MINUTES Patient taking differently: Place 0.4 mg under the tongue every 5 (five) minutes as needed for chest pain.  09/04/20   [name], [name], MD    Allergies    Patient has no known allergies.  Review of Systems   Review of Systems All other systems are reviewed and are negative for acute change except as noted in the HPI.  Physical Exam Updated Vital Signs BP 118/80   Pulse 62   Temp 98.1 F (36.7 C) (Oral)   Resp 16   SpO2 98%   Physical Exam Vitals and nursing note reviewed.  Constitutional:      General: He is not in acute distress.    Appearance: He is not ill-appearing.  HENT:     Head: Normocephalic and atraumatic.     Right Ear: Tympanic membrane and external ear normal.     Left Ear: Tympanic membrane and external ear normal.     Nose: Nose normal.     Mouth/Throat:     Mouth:  Mucous membranes are moist.     Pharynx: Oropharynx is clear.  Eyes:     General: No scleral icterus.       Right eye: No discharge.        Left eye: No discharge.     Extraocular Movements: Extraocular movements intact.     Conjunctiva/sclera: Conjunctivae normal.     Pupils: Pupils are equal, round, and reactive to light.  Neck:     Vascular: No JVD.  Cardiovascular:     Rate and Rhythm: Normal rate and regular rhythm.     Pulses: Normal pulses.          Radial pulses are 2+ on the right side and 2+ on the left side.       Dorsalis pedis pulses are 2+ on the right side and 2+ on the left side.     Heart sounds: Normal heart sounds.  Pulmonary:     Comments: Lungs clear to auscultation in all fields. Symmetric chest rise. No wheezing, rales, or rhonchi. Chest:     Chest wall: No tenderness.  Abdominal:     Comments: Abdomen is soft, non-distended, and non-tender in all quadrants. No rigidity, no guarding. No peritoneal signs.  Musculoskeletal:        General: Normal range of motion.     Cervical back: Normal range of motion.     Right lower leg: No edema.     Left lower leg: No edema.  Skin:    General: Skin is warm and dry.     Capillary Refill: Capillary refill takes less than 2 seconds.  Neurological:     Mental Status: He is oriented to person, place, and time.     GCS: GCS eye subscore is 4. GCS verbal subscore is 5. GCS motor subscore is 6.     Comments: Fluent speech, no facial droop.  Psychiatric:        Behavior: Behavior normal.     ED Results / Procedures / Treatments   Labs (all labs ordered are listed, but only abnormal results are displayed) Labs Reviewed  BASIC METABOLIC PANEL - Abnormal; Notable for the following components:      Result Value   Chloride 96 (*)    Glucose, Bld 105 (*)    BUN  67 (*)    Creatinine, Ser 14.39 (*)    GFR, Estimated 3 (*)    Anion gap 20 (*)    All other components within normal limits  CBC - Abnormal; Notable for the  following components:   RBC 3.57 (*)    Hemoglobin 11.3 (*)    HCT 33.8 (*)    RDW 17.0 (*)    All other components within normal limits  TROPONIN I (HIGH SENSITIVITY) - Abnormal; Notable for the following components:   Troponin I (High Sensitivity) 33 (*)    All other components within normal limits  TROPONIN I (HIGH SENSITIVITY) - Abnormal; Notable for the following components:   Troponin I (High Sensitivity) 29 (*)    All other components within normal limits    EKG EKG Interpretation  Date/Time:  Saturday September 29 2020 12:05:38 EST Ventricular Rate:  76 PR Interval:  198 QRS Duration: 90 QT Interval:  396 QTC Calculation: 445 R Axis:   -23 Text Interpretation: Normal sinus rhythm T wave abnormality, consider lateral ischemia Abnormal ECG No significant change since last tracing No STEMI Confirmed by Nanda Quinton [phone]) on 09/29/2020 6:22:40 PM   Radiology DG Chest 2 View  Result Date: 09/29/2020 CLINICAL DATA:  Chest pain for several hours EXAM: CHEST - 2 VIEW COMPARISON:  09/24/2020 FINDINGS: The heart size and mediastinal contours are within normal limits. Both lungs are clear. The visualized skeletal structures are unremarkable. IMPRESSION: No active cardiopulmonary disease. Electronically Signed   By: Inez Catalina M.D.   On: 09/29/2020 12:19    Procedures Procedures (including critical care time)  Medications Ordered in ED Medications - No data to display  ED Course  I have reviewed the triage vital signs and the nursing notes.  Pertinent labs & imaging results that were available during my care of the patient were reviewed by me and considered in my medical decision making (see chart for details).    MDM Rules/Calculators/A&P                          History provided by patient with additional history obtained from chart review.    Patient presents to the emergency department with chest pain. Patient nontoxic appearing, in no apparent distress, vitals  without significant abnormality. Fairly benign physical exam. DDX: ACS, pulmonary embolism, dissection, pneumothorax, effusion, infiltrate, arrhythmia, anemia, electrolyte derangement, MSK. Evaluation initiated with labs, EKG, and CXR. Patient on cardiac monitor.  Patient does not appear volume overloaded on exam.  HEAR score of 4. Labs were collected in triage.  I viewed results. CBC without leukocytosis, hemoglobin consistent with baseline.  BMP shows kidney function also consistent with baseline, no significant electrolyte derangement.  Delta troponin flat, first was 33 and second was 29.  EKG without obvious ischemia.  Chest pain not suggestive of ACS.  No signs of hypertensive urgency or emergency, blood pressure most recently checked was 118/80.  CXR without infiltrate, effusion, pneumothorax, or fracture/dislocation.  Pain is not a tearing sensation, symmetric pulses, no widening of mediastinum on CXR, doubt dissection. Cardiac monitor reviewed, no notable arrhythmias or tachycardia. Patient has appeared hemodynamically stable throughout ER visit. He will need to have make-up dialysis session as he missed today however this can be outpatient as there are no criteria for emergent dialysis now. Patient appears safe for discharge with close PCP/cardiology follow up. I discussed results, treatment plan, need for PCP follow-up, and return precautions with the patient. Provided opportunity  for questions, patient confirmed understanding and is in agreement with plan. The patient was discussed with and seen by Dr. Laverta Baltimore who agrees with the treatment plan.   Portions of this note were generated with [employer]. Dictation errors may occur despite best attempts at proofreading.   Final Clinical Impression(s) / ED Diagnoses Final diagnoses:  Atypical chest pain    Rx / DC Orders ED Discharge Orders    None       Lewanda Rife 09/29/20 1854    Margette Fast,  MD 09/30/20 1350

## 2020-09-29 NOTE — ED Triage Notes (Signed)
Patient reports left chest pain this evening , denies SOB , no emesis or diaphoresis .

## 2020-09-29 NOTE — ED Triage Notes (Signed)
Pt to triage via [facility] from home.  Reports 9/10 chest pain since 9:30am.  Missed dialysis today.  18g IV R AC, ASA 324mg , and 2 NTG given PTA.  Denies pain at present.

## 2020-09-29 NOTE — Discharge Instructions (Addendum)
You should call your dialysis center first thing Monday morning to schedule a make-up session since you missed today.  Your blood work today was reassuring.  Based on your exam and labs today you do not need emergent dialysis.  If you develop worsening chest pain, shortness of breath or any other concerning symptoms return immediately to the emergency department.  You should follow-up with your cardiologist as soon as possible for further evaluation of your chest pain.  Continue your home medications, there were no changes made today.

## 2020-09-30 MED ORDER — AMLODIPINE BESYLATE 5 MG PO TABS
10.0000 mg | ORAL_TABLET | Freq: Once | ORAL | Status: AC
  Administered 2020-09-30: 10 mg via ORAL
  Filled 2020-09-30: qty 2

## 2020-09-30 MED ORDER — ISOSORBIDE MONONITRATE ER 30 MG PO TB24
60.0000 mg | ORAL_TABLET | Freq: Once | ORAL | Status: AC
  Administered 2020-09-30: 60 mg via ORAL
  Filled 2020-09-30: qty 2

## 2020-09-30 MED ORDER — METOPROLOL TARTRATE 25 MG PO TABS
25.0000 mg | ORAL_TABLET | Freq: Once | ORAL | Status: AC
  Administered 2020-09-30: 25 mg via ORAL
  Filled 2020-09-30: qty 1

## 2020-09-30 NOTE — ED Notes (Signed)
Pt given sandwich bag 

## 2020-09-30 NOTE — ED Provider Notes (Signed)
Kenneth [name]   CSN: [id number] Arrival date & time: 09/29/20  2023     History Chief Complaint  Patient presents with  . Chest Pain    BP=204/91    Kenneth [name] is a 64 y.o. male.  Patient apparently cannot get a taxi voucher home so check back in to be evaluated again.  Nothing is changed.  States that he could possibly take the bus if needed.   Chest Pain      Past Medical History:  Diagnosis Date  . Acute CHF ([place]) 01/2018  . Cardiomyopathy secondary    likely related to HTN heart disease; possibly ETOH related as well  . Chronic combined systolic and diastolic heart failure (HCC)    Echocardiogram 09/22/11: Moderate LVH, EF 85-63%, grade 3 diastolic dysfunction, mild MR, moderate to severe LAE, mild RVE, mild to moderate TR, small to moderate pericardial effusion  . Coronary artery disease    not felt to be candidate for CABG 08/2018; medical thearpy recomended due to high risk of PCI   . Dysrhythmia    aflutter 04/2016, afib 02/2018, not felt to be a candidate for anticoagulation due to non-compliance and ETOH  . ESRD (end stage renal disease) on dialysis ([place])    due to hypertensive nephrosclerosis; TTS; [address] (09/01/2018)  . History of alcohol abuse   . Hypertension   . Myocardial infarction [facility])    " mild " per daughter  . PEA (Pulseless electrical activity) ([place])    PEA arrest 03/09/18, treated empirically for hyperkalemia, shock x1 for WCT, given amiodarone, ROSC after 10 min of ACLS    Patient Active Problem List   Diagnosis Date Noted  . Chest pain of uncertain etiology 14/97/0263  . Accelerated hypertension 09/24/2020  . Dyslipidemia 09/24/2020  . NSTEMI (non-ST elevated myocardial infarction) ([place]) 05/12/2020  . Non-ST elevated myocardial infarction ([place]) 05/12/2020  . DNR (do not resuscitate) discussion   . Palliative care by specialist   . Goals of care, counseling/discussion   . Coronary  artery disease involving native heart with angina pectoris ([place])   . STEMI (ST elevation myocardial infarction) ([place]) 09/17/2018  . Unstable angina ([place])   . ACS (acute coronary syndrome) ([place]) 09/01/2018  . ESRD (end stage renal disease) ([place]) 07/20/2018  . Acute respiratory failure ([place]) 05/11/2018  . Cardiac arrest ([place]) 03/09/2018  . Acute encephalopathy   . Acute on chronic respiratory failure ([place]) 02/16/2018  . Tobacco dependence 02/16/2018  . Pulmonary edema 01/26/2018  . Acute respiratory failure with hypoxia ([place]) 01/26/2018  . Atrial fibrillation and flutter ([place])   . Shortness of breath   . Acute on chronic diastolic CHF (congestive heart failure) ([place])   . Hypothermia 10/08/2016  . ESRD on hemodialysis ([place]) 10/08/2016  . Fall 10/08/2016  . Syncope 10/07/2016  . Near syncope 07/07/2016  . Cardiomyopathy ([place]) 07/07/2016  . Ventricular tachycardia, non-sustained ([place]) 07/06/2016  . Hypoglycemia 07/06/2016  . CKD (chronic kidney disease) stage 5, GFR less than 15 ml/min (HCC)   . Alcoholism ([place]) 05/13/2016  . Hypertensive emergency 12/08/2013  . Cardiomyopathy secondary 10/09/2011  . Alcohol withdrawal delirium ([place]) 09/25/2011  . Chronic diastolic CHF (congestive heart failure) ([place]) 09/24/2011    Past Surgical History:  Procedure Laterality Date  . AV FISTULA PLACEMENT Left 05/14/2016   Procedure: LEFT ARM BASILIC VEIN TRANSPOSITION;  Surgeon: Rosetta Posner, MD;  Location: [facility];  Service: Vascular;  Laterality: Left;  . INSERTION  OF DIALYSIS CATHETER N/A 06/20/2019   Procedure: INSERTION OF TUNNELED  DIALYSIS CATHETER;  Surgeon: Elam Dutch, MD;  Location: [place];  Service: Vascular;  Laterality: N/A;  . LEFT HEART CATH AND CORONARY ANGIOGRAPHY N/A 09/02/2018   Procedure: LEFT HEART CATH AND CORONARY ANGIOGRAPHY;  Surgeon: Nelva Bush, MD;  Location: [place] [facility];  Service: Cardiovascular;  Laterality: N/A;  . LEFT HEART CATH AND CORONARY ANGIOGRAPHY  N/A 05/14/2020   Procedure: LEFT HEART CATH AND CORONARY ANGIOGRAPHY;  Surgeon: Sherren Mocha, MD;  Location: [place] [facility];  Service: Cardiovascular;  Laterality: N/A;  . PERIPHERAL VASCULAR CATHETERIZATION N/A 05/13/2016   Procedure: Dialysis/Perma Catheter Insertion;  Surgeon: Serafina Mitchell, MD;  Location: [place] [facility];  Service: Cardiovascular;  Laterality: N/A;  . REVISION OF ARTERIOVENOUS GORETEX GRAFT Left 11/01/2692   Procedure: PLICATION OF ARTERIOVENOUS FISTULA LEFT ARM;  Surgeon: Elam Dutch, MD;  Location: [facility] OR;  Service: Vascular;  Laterality: Left;       Family History  Problem Relation Age of Onset  . Emphysema Mother   . Cirrhosis Father     Social History   Tobacco Use  . Smoking status: Former Smoker    Packs/day: 2.00    Years: 48.00    Pack years: 96.00    Types: Cigars    Quit date: 10/27/2017    Years since quitting: 2.9  . Smokeless tobacco: Former Systems developer    Types: Secondary school teacher  . Vaping Use: Never used  Substance Use Topics  . Alcohol use: Not Currently    Comment: h/o very heavy use, quit about 2019  . Drug use: No    Home Medications Prior to Admission medications   Medication Sig Start Date End Date Taking? Authorizing Provider  amLODipine (NORVASC) 10 MG tablet Take 1 tablet (10 mg total) by mouth daily. 09/24/20   Dwyane Dee, MD  aspirin EC 81 MG EC tablet Take 1 tablet (81 mg total) by mouth daily. Swallow whole. 05/16/20   Alma Friendly, MD  atorvastatin (LIPITOR) 80 MG tablet Take 1 tablet (80 mg total) by mouth daily. 03/16/20   [name], [name], MD  clopidogrel (PLAVIX) 75 MG tablet Take 1 tablet by mouth once daily Patient taking differently: Take 75 mg by mouth daily.  05/17/20   Almyra Deforest, PA  diphenhydramine-acetaminophen (TYLENOL PM EXTRA STRENGTH) 25-500 MG TABS tablet Take 2 tablets by mouth at bedtime as needed (Pain, Sleep).  05/12/18   [provider]  isosorbide mononitrate (IMDUR) 30 MG 24 hr  tablet Take 3 tablets (90 mg total) by mouth daily. 09/24/20   Dwyane Dee, MD  metoprolol succinate (TOPROL-XL) 25 MG 24 hr tablet Take 1 tablet (25 mg total) by mouth 4 (four) times a week. Take on Sunday, Monday, Wednesday, Friday 06/26/20   [name], [name], MD  nitroGLYCERIN (NITROSTAT) 0.4 MG SL tablet DISSOLVE ONE TABLET UNDER THE TONGUE EVERY 5 MINUTES AS NEEDED FOR CHEST PAIN.  DO NOT EXCEED A TOTAL OF 3 DOSES IN 15 MINUTES Patient taking differently: Place 0.4 mg under the tongue every 5 (five) minutes as needed for chest pain.  09/04/20   [name], [name], MD    Allergies    Patient has no known allergies.  Review of Systems   Review of Systems  Cardiovascular: Positive for chest pain.  All other systems reviewed and are negative.   Physical Exam Updated Vital Signs BP (!) 161/75 (BP Location: Right Arm)  Pulse 74   Temp 98.1 F (36.7 C) (Oral)   Resp 18   Ht 5\' 10"  (1.778 m)   Wt 100 kg   SpO2 98%   BMI 31.63 kg/m   Physical Exam Vitals and nursing [name] reviewed.  Constitutional:      Appearance: He is well-developed.  HENT:     Head: Normocephalic and atraumatic.     Nose: No congestion or rhinorrhea.     Mouth/Throat:     Mouth: Mucous membranes are moist.  Eyes:     Pupils: Pupils are equal, round, and reactive to light.  Cardiovascular:     Rate and Rhythm: Normal rate.  Pulmonary:     Effort: Pulmonary effort is normal. No respiratory distress.  Chest:     Chest wall: No mass or deformity.  Abdominal:     General: There is no distension.     Palpations: Abdomen is soft.  Musculoskeletal:        General: Normal range of motion.     Cervical back: Normal range of motion.  Neurological:     General: No focal deficit present.     Mental Status: He is alert.     ED Results / Procedures / Treatments   Labs (all labs ordered are listed, but only abnormal results are displayed) Labs Reviewed  BASIC METABOLIC PANEL - Abnormal; Notable for the  following components:      Result Value   Chloride 95 (*)    BUN 67 (*)    Creatinine, Ser 14.67 (*)    GFR, Estimated 3 (*)    Anion gap 21 (*)    All other components within normal limits  CBC - Abnormal; Notable for the following components:   RBC 3.67 (*)    Hemoglobin 11.7 (*)    HCT 34.7 (*)    RDW 16.9 (*)    All other components within normal limits  TROPONIN I (HIGH SENSITIVITY) - Abnormal; Notable for the following components:   Troponin I (High Sensitivity) 36 (*)    All other components within normal limits  TROPONIN I (HIGH SENSITIVITY) - Abnormal; Notable for the following components:   Troponin I (High Sensitivity) 37 (*)    All other components within normal limits  PROTIME-INR    EKG None  Radiology DG Chest 2 View  Result Date: 09/29/2020 CLINICAL DATA:  Chest pain EXAM: CHEST - 2 VIEW COMPARISON:  09/29/2020 at 12:04 a.m. FINDINGS: The heart size and mediastinal contours are within normal limits. Both lungs are clear. The visualized skeletal structures are unremarkable. IMPRESSION: No active cardiopulmonary disease. Electronically Signed   By: Randa Ngo M.D.   On: 09/29/2020 21:29   DG Chest 2 View  Result Date: 09/29/2020 CLINICAL DATA:  Chest pain for several hours EXAM: CHEST - 2 VIEW COMPARISON:  09/24/2020 FINDINGS: The heart size and mediastinal contours are within normal limits. Both lungs are clear. The visualized skeletal structures are unremarkable. IMPRESSION: No active cardiopulmonary disease. Electronically Signed   By: Inez Catalina M.D.   On: 09/29/2020 12:19    Procedures Procedures (including critical care time)  Medications Ordered in ED Medications  isosorbide mononitrate (IMDUR) 24 hr tablet 60 mg (60 mg Oral Given 09/30/20 0023)  amLODipine (NORVASC) tablet 10 mg (10 mg Oral Given 09/30/20 0318)  metoprolol tartrate (LOPRESSOR) tablet 25 mg (25 mg Oral Given 09/30/20 0319)    ED Course  I have reviewed the triage vital signs and  the nursing notes.  Pertinent labs & imaging results that were available during my care of the patient were reviewed by me and considered in my medical decision making (see chart for details).    MDM Rules/Calculators/A&P                          Still slightly hypertensive that goes up and down.  His troponins are stable that were ordered in triage.  His EKG is stable.  This is a social issue.  There is no indication for admission or further evaluation observation emergency room.  Patient was given a meal and was discharged as clinically appropriate.  Final Clinical Impression(s) / ED Diagnoses Final diagnoses:  Nonspecific chest pain  Hypertension, unspecified type    Rx / DC Orders ED Discharge Orders    None       Cheikh Bramble, Corene Cornea, MD 09/30/20 [phone]

## 2020-09-30 NOTE — ED Notes (Signed)
E-signature pad unavailable at time of pt discharge. This RN discussed discharge materials with pt and answered all pt questions. Pt stated understanding of discharge material. ? ?

## 2020-09-30 NOTE — ED Notes (Signed)
Triage RN made aware of blood pressure

## 2020-10-02 ENCOUNTER — Other Ambulatory Visit: Payer: Self-pay

## 2020-10-02 ENCOUNTER — Emergency Department (HOSPITAL_COMMUNITY): Payer: Medicare Other

## 2020-10-02 ENCOUNTER — Encounter (HOSPITAL_COMMUNITY): Payer: Self-pay

## 2020-10-02 ENCOUNTER — Emergency Department (HOSPITAL_COMMUNITY)
Admission: EM | Admit: 2020-10-02 | Discharge: 2020-10-02 | Disposition: A | Discharge status: Home / Self Care | Payer: Medicare Other | Source: Home / Self Care | Attending: Emergency Medicine | Admitting: Emergency Medicine

## 2020-10-02 ENCOUNTER — Inpatient Hospital Stay (HOSPITAL_COMMUNITY)
Admission: EM | Admit: 2020-10-02 | Discharge: 2020-10-04 | DRG: 280 | Disposition: A | Discharge status: Home / Self Care | Payer: Medicare Other | Attending: Internal Medicine | Admitting: Internal Medicine

## 2020-10-02 DIAGNOSIS — Z7982 Long term (current) use of aspirin: Secondary | ICD-10-CM | POA: Insufficient documentation

## 2020-10-02 DIAGNOSIS — I2510 Atherosclerotic heart disease of native coronary artery without angina pectoris: Secondary | ICD-10-CM | POA: Diagnosis present

## 2020-10-02 DIAGNOSIS — Z992 Dependence on renal dialysis: Secondary | ICD-10-CM | POA: Diagnosis not present

## 2020-10-02 DIAGNOSIS — I252 Old myocardial infarction: Secondary | ICD-10-CM

## 2020-10-02 DIAGNOSIS — I4892 Unspecified atrial flutter: Secondary | ICD-10-CM | POA: Diagnosis present

## 2020-10-02 DIAGNOSIS — D649 Anemia, unspecified: Secondary | ICD-10-CM | POA: Diagnosis present

## 2020-10-02 DIAGNOSIS — Z87891 Personal history of nicotine dependence: Secondary | ICD-10-CM | POA: Insufficient documentation

## 2020-10-02 DIAGNOSIS — R079 Chest pain, unspecified: Secondary | ICD-10-CM | POA: Insufficient documentation

## 2020-10-02 DIAGNOSIS — Z9119 Patient's noncompliance with other medical treatment and regimen: Secondary | ICD-10-CM | POA: Diagnosis not present

## 2020-10-02 DIAGNOSIS — Z66 Do not resuscitate: Secondary | ICD-10-CM | POA: Diagnosis not present

## 2020-10-02 DIAGNOSIS — N186 End stage renal disease: Secondary | ICD-10-CM | POA: Diagnosis present

## 2020-10-02 DIAGNOSIS — Z7189 Other specified counseling: Secondary | ICD-10-CM | POA: Diagnosis not present

## 2020-10-02 DIAGNOSIS — I4891 Unspecified atrial fibrillation: Secondary | ICD-10-CM | POA: Diagnosis present

## 2020-10-02 DIAGNOSIS — E785 Hyperlipidemia, unspecified: Secondary | ICD-10-CM | POA: Diagnosis present

## 2020-10-02 DIAGNOSIS — Z7902 Long term (current) use of antithrombotics/antiplatelets: Secondary | ICD-10-CM | POA: Diagnosis not present

## 2020-10-02 DIAGNOSIS — Z79899 Other long term (current) drug therapy: Secondary | ICD-10-CM

## 2020-10-02 DIAGNOSIS — I5042 Chronic combined systolic (congestive) and diastolic (congestive) heart failure: Secondary | ICD-10-CM | POA: Diagnosis not present

## 2020-10-02 DIAGNOSIS — N2581 Secondary hyperparathyroidism of renal origin: Secondary | ICD-10-CM | POA: Diagnosis present

## 2020-10-02 DIAGNOSIS — I482 Chronic atrial fibrillation, unspecified: Secondary | ICD-10-CM | POA: Diagnosis present

## 2020-10-02 DIAGNOSIS — Z825 Family history of asthma and other chronic lower respiratory diseases: Secondary | ICD-10-CM

## 2020-10-02 DIAGNOSIS — I132 Hypertensive heart and chronic kidney disease with heart failure and with stage 5 chronic kidney disease, or end stage renal disease: Secondary | ICD-10-CM | POA: Insufficient documentation

## 2020-10-02 DIAGNOSIS — I5020 Unspecified systolic (congestive) heart failure: Secondary | ICD-10-CM | POA: Diagnosis not present

## 2020-10-02 DIAGNOSIS — I10 Essential (primary) hypertension: Secondary | ICD-10-CM | POA: Diagnosis not present

## 2020-10-02 DIAGNOSIS — R0789 Other chest pain: Secondary | ICD-10-CM | POA: Diagnosis not present

## 2020-10-02 DIAGNOSIS — I161 Hypertensive emergency: Secondary | ICD-10-CM | POA: Diagnosis present

## 2020-10-02 DIAGNOSIS — I214 Non-ST elevation (NSTEMI) myocardial infarction: Secondary | ICD-10-CM | POA: Diagnosis not present

## 2020-10-02 DIAGNOSIS — I200 Unstable angina: Secondary | ICD-10-CM | POA: Diagnosis not present

## 2020-10-02 DIAGNOSIS — R531 Weakness: Secondary | ICD-10-CM | POA: Diagnosis not present

## 2020-10-02 DIAGNOSIS — I201 Angina pectoris with documented spasm: Secondary | ICD-10-CM | POA: Diagnosis not present

## 2020-10-02 DIAGNOSIS — Z515 Encounter for palliative care: Secondary | ICD-10-CM | POA: Diagnosis not present

## 2020-10-02 DIAGNOSIS — E162 Hypoglycemia, unspecified: Secondary | ICD-10-CM | POA: Diagnosis not present

## 2020-10-02 DIAGNOSIS — Z20822 Contact with and (suspected) exposure to covid-19: Secondary | ICD-10-CM | POA: Diagnosis present

## 2020-10-02 DIAGNOSIS — I160 Hypertensive urgency: Secondary | ICD-10-CM

## 2020-10-02 DIAGNOSIS — R0602 Shortness of breath: Secondary | ICD-10-CM | POA: Diagnosis not present

## 2020-10-02 DIAGNOSIS — N250 Renal osteodystrophy: Secondary | ICD-10-CM | POA: Diagnosis not present

## 2020-10-02 DIAGNOSIS — R110 Nausea: Secondary | ICD-10-CM | POA: Diagnosis not present

## 2020-10-02 LAB — BASIC METABOLIC PANEL
Anion gap: 23 — ABNORMAL HIGH (ref 5–15)
BUN: 95 mg/dL — ABNORMAL HIGH (ref 8–23)
CO2: 20 mmol/L — ABNORMAL LOW (ref 22–32)
Calcium: 8.6 mg/dL — ABNORMAL LOW (ref 8.9–10.3)
Chloride: 97 mmol/L — ABNORMAL LOW (ref 98–111)
Creatinine, Ser: 19.46 mg/dL — ABNORMAL HIGH (ref 0.61–1.24)
GFR, Estimated: 2 mL/min — ABNORMAL LOW (ref >60)
Glucose, Bld: 86 mg/dL (ref 70–99)
Potassium: 4.7 mmol/L (ref 3.5–5.1)
Sodium: 140 mmol/L (ref 135–145)

## 2020-10-02 LAB — CBC
HCT: 29.4 % — ABNORMAL LOW (ref 39.0–52.0)
Hemoglobin: 9.9 g/dL — ABNORMAL LOW (ref 13.0–17.0)
MCH: 31.5 pg (ref 26.0–34.0)
MCHC: 33.7 g/dL (ref 30.0–36.0)
MCV: 93.6 fL (ref 80.0–100.0)
Platelets: 242 10*3/uL (ref 150–400)
RBC: 3.14 MIL/uL — ABNORMAL LOW (ref 4.22–5.81)
RDW: 16.9 % — ABNORMAL HIGH (ref 11.5–15.5)
WBC: 8.4 10*3/uL (ref 4.0–10.5)
nRBC: 0 % (ref 0.0–0.2)

## 2020-10-02 LAB — TROPONIN I (HIGH SENSITIVITY)
Troponin I (High Sensitivity): 58 ng/L — ABNORMAL HIGH (ref <18)
Troponin I (High Sensitivity): 66 ng/L — ABNORMAL HIGH (ref <18)
Troponin I (High Sensitivity): 1721 ng/L (ref <18)
Troponin I (High Sensitivity): 1991 ng/L (ref <18)

## 2020-10-02 LAB — RESP PANEL BY RT-PCR (FLU A&B, COVID) ARPGX2
Influenza A by PCR: NEGATIVE
Influenza B by PCR: NEGATIVE
SARS Coronavirus 2 by RT PCR: NEGATIVE

## 2020-10-02 MED ORDER — ONDANSETRON HCL 4 MG/2ML IJ SOLN: 4.0000 mg | Freq: Four times a day (QID) | INTRAMUSCULAR | Status: DC | PRN

## 2020-10-02 MED ORDER — HEPARIN BOLUS VIA INFUSION
4000.0000 [IU] | Freq: Once | INTRAVENOUS | Status: AC
  Administered 2020-10-02: 4000 [IU] via INTRAVENOUS
  Filled 2020-10-02: qty 4000

## 2020-10-02 MED ORDER — NITROGLYCERIN IN D5W 200-5 MCG/ML-% IV SOLN
0.0000 ug/min | INTRAVENOUS | Status: DC
  Administered 2020-10-03: 5 ug/min via INTRAVENOUS
  Administered 2020-10-03: 20 ug/min via INTRAVENOUS
  Filled 2020-10-02: qty 250

## 2020-10-02 MED ORDER — NITROGLYCERIN 0.4 MG SL SUBL
0.4000 mg | SUBLINGUAL_TABLET | Freq: Once | SUBLINGUAL | Status: AC
  Administered 2020-10-02: 0.4 mg via SUBLINGUAL
  Filled 2020-10-02: qty 1

## 2020-10-02 MED ORDER — HEPARIN (PORCINE) 25000 UT/250ML-% IV SOLN
1700.0000 [IU]/h | INTRAVENOUS | Status: DC
  Administered 2020-10-02: 22:00:00 1200 [IU]/h via INTRAVENOUS
  Administered 2020-10-03: 20:00:00 1500 [IU]/h via INTRAVENOUS
  Filled 2020-10-02 (×3): qty 250

## 2020-10-02 MED ORDER — NITROGLYCERIN 0.4 MG SL SUBL: 0.4000 mg | SUBLINGUAL_TABLET | SUBLINGUAL | Status: DC | PRN

## 2020-10-02 MED ORDER — HYDRALAZINE HCL 20 MG/ML IJ SOLN
10.0000 mg | Freq: Once | INTRAMUSCULAR | Status: AC
  Administered 2020-10-02: 10 mg via INTRAVENOUS
  Filled 2020-10-02: qty 1

## 2020-10-02 MED ORDER — HYDRALAZINE HCL 20 MG/ML IJ SOLN: 10.0000 mg | INTRAMUSCULAR | Status: DC | PRN

## 2020-10-02 MED ORDER — ACETAMINOPHEN 325 MG PO TABS: 650.0000 mg | ORAL_TABLET | ORAL | Status: DC | PRN

## 2020-10-02 NOTE — ED Notes (Signed)
No IV present   Patient to be discharged, made aware of DC instructions and verbalized understanding.   Patient ambulatory, SW made aware of needing a ride.   BP (!) 192/96   Pulse 71   Temp 98.1 F (36.7 C) (Oral)   Resp 17   Ht 5\' 10"  (1.778 m)   Wt 100 kg   SpO2 99%   BMI 31.63 kg/m

## 2020-10-02 NOTE — ED Notes (Signed)
Pt walked into room coming from hallway.

## 2020-10-02 NOTE — Consult Note (Signed)
[facility] Consult Note   Primary Physician: [name], MD Primary Cardiologist:  [name], Peter, MD  Reason for Consultation: Chest pain  HPI:    Kenneth [name] is a 64 year old gentleman with a past medical history significant for multivessel CAD (not revascularized), AFIB/flutter (not on anticoagulation), ESRD (on HD TTS), hypertension, medication non-compliance, heavy ETOH use, and diastolic heart failure, who presented to the hospital with recurrent resting chest pain. He described the pain as substernal, 10/10 in intensity and associated with nausea and vomiting. He denied any orthopnea or leg swelling.  In the ED the BP was as high as 203/120 mmHg, The heart rate was in the 60s and 70s. The initial high-sensitivity troponin was 1721.  The ECG from 10/02/2020 (at 15:30) that I reviewed personally showed NSR, rate 64 bpm, LVH, poor R progression in the anterior leads and T wave inversions in V5 and V6 (with downslopping ST depressions).   Summary of cardiac medical history: His EF was in 25 to 30% range in 2017. Repeat echocardiogram in February 2019 showed normalization of LVEF of 65 to 70%. He was admitted in May 2019 with acute hypoxic respiratory failure due to pulmonary edema from noncompliance with hemodialysis. While in the emergency room, he suffered a PEA arrest. He received empiric treatment for hyperkalemia and shock for wide complex tachycardia. He was also treated with amiodarone. He achieved ROSC after 10 minutes of ACLS, following this he had A. fib with RVR and ultimately reverted to sinus rhythm. Cardiology was not consulted at the time. Echocardiogram obtained on 03/09/2018 demonstrated severe LVH, normal EF of 55 to 60%, moderately decreased RV systolic function and a thickened aortic and mitral valve. There was some mention in the echocardiogram for consideration of evaluation for cardiac amyloidosis including PYP scan, this never took place. Patient  was admitted in early November 2019 with chest pain. During the hospitalization, he had significant T wave inversions in inferior and lateral leads concerning for progression of coronary artery disease. He ended up having cardiac catheterization on 09/02/2018 which showed severe multivessel CAD involving distal left main, ostial to proximal LAD, ostial to proximal left circumflex, mid RCA. CT surgery was consulted. Patient was seen by Dr. Prescott Gum on 09/03/2018,given his comorbidities, although CABG likely will improve his cardiac issue but likely will result in further severe problems with multiorgan failure and dysfunction. Therefore he was turned down for bypass surgery. He was felt to be a very high risk patient for PCI and the risk was prohibitive especially in light of his compliance issue. The procedure was also deemed high risk as he would require unprotected left main intervention, atherectomy of LAD and bifurcation stenting. He was eventually discharged on medical therapy. He has had multiple readmissions to the hospital since then for recurrent chest pain (usually in the setting of medication non-compliance/missed dialysis sessions).   Pertinent testing: Cardiac catheterization - 05/14/2020 1.  Severe multivessel coronary artery disease with severe calcific distal left main stenosis, severe diffuse ostial/proximal LAD stenosis, severe stenosis of the proximal left circumflex/first OM, and interval occlusion of the distal RCA with left-to-right collaterals 2.  Heavily calcified distal abdominal aorta and bilateral iliac arteries with mild diffuse calcified disease.  No severe iliac lesions identified.  Echocardiogram - 05/13/2020 1.  Left ventricular ejection fraction, by estimation, is 55 to 60%. The left ventricle has normal function. The left ventricle has no regional wall motion abnormalities. There is moderate left ventricular hypertrophy. Left  ventricular diastolic parameters are  consistent with Grade I diastolic dysfunction (impaired relaxation). 2. Right ventricular systolic function is normal. The right ventricular size is normal. There is normal pulmonary artery systolic pressure. 3. Left atrial size was mildly dilated. 4. The mitral valve is abnormal. Moderate mitral valve regurgitation. 5. The aortic valve is tricuspid. Aortic valve regurgitation is not visualized.     Home Medications Prior to Admission medications   Medication Sig Start Date End Date Taking? Authorizing Provider  amLODipine (NORVASC) 10 MG tablet Take 1 tablet (10 mg total) by mouth daily. 09/24/20   Dwyane Dee, MD  aspirin EC 81 MG EC tablet Take 1 tablet (81 mg total) by mouth daily. Swallow whole. 05/16/20   Alma Friendly, MD  atorvastatin (LIPITOR) 80 MG tablet Take 1 tablet (80 mg total) by mouth daily. 03/16/20   [name], [name], MD  clopidogrel (PLAVIX) 75 MG tablet Take 1 tablet by mouth once daily Patient taking differently: Take 75 mg by mouth daily.  05/17/20   Almyra Deforest, PA  diphenhydramine-acetaminophen (TYLENOL PM EXTRA STRENGTH) 25-500 MG TABS tablet Take 2 tablets by mouth at bedtime as needed (Pain, Sleep).  05/12/18   [provider]  isosorbide mononitrate (IMDUR) 30 MG 24 hr tablet Take 3 tablets (90 mg total) by mouth daily. 09/24/20   Dwyane Dee, MD  metoprolol succinate (TOPROL-XL) 25 MG 24 hr tablet Take 1 tablet (25 mg total) by mouth 4 (four) times a week. Take on Sunday, Monday, Wednesday, Friday 06/26/20   [name], [name], MD  nitroGLYCERIN (NITROSTAT) 0.4 MG SL tablet DISSOLVE ONE TABLET UNDER THE TONGUE EVERY 5 MINUTES AS NEEDED FOR CHEST PAIN.  DO NOT EXCEED A TOTAL OF 3 DOSES IN 15 MINUTES Patient taking differently: Place 0.4 mg under the tongue every 5 (five) minutes as needed for chest pain.  09/04/20   [name], [name], MD    Past Medical History: Past Medical History:  Diagnosis Date  . Acute CHF ([place]) 01/2018  . Cardiomyopathy  secondary    likely related to HTN heart disease; possibly ETOH related as well  . Chronic combined systolic and diastolic heart failure (HCC)    Echocardiogram 09/22/11: Moderate LVH, EF 89-38%, grade 3 diastolic dysfunction, mild MR, moderate to severe LAE, mild RVE, mild to moderate TR, small to moderate pericardial effusion  . Coronary artery disease    not felt to be candidate for CABG 08/2018; medical thearpy recomended due to high risk of PCI   . Dysrhythmia    aflutter 04/2016, afib 02/2018, not felt to be a candidate for anticoagulation due to non-compliance and ETOH  . ESRD (end stage renal disease) on dialysis ([place])    due to hypertensive nephrosclerosis; TTS; [address] (09/01/2018)  . History of alcohol abuse   . Hypertension   . Myocardial infarction [facility])    " mild " per daughter  . PEA (Pulseless electrical activity) ([place])    PEA arrest 03/09/18, treated empirically for hyperkalemia, shock x1 for WCT, given amiodarone, ROSC after 10 min of ACLS    Past Surgical History: Past Surgical History:  Procedure Laterality Date  . AV FISTULA PLACEMENT Left 05/14/2016   Procedure: LEFT ARM BASILIC VEIN TRANSPOSITION;  Surgeon: Rosetta Posner, MD;  Location: [facility];  Service: Vascular;  Laterality: Left;  . INSERTION OF DIALYSIS CATHETER N/A 06/20/2019   Procedure: INSERTION OF TUNNELED  DIALYSIS CATHETER;  Surgeon: Elam Dutch, MD;  Location: [facility];  Service: Vascular;  Laterality: N/A;  . LEFT HEART CATH AND CORONARY ANGIOGRAPHY N/A 09/02/2018   Procedure: LEFT HEART CATH AND CORONARY ANGIOGRAPHY;  Surgeon: Nelva Bush, MD;  Location: [place] [facility];  Service: Cardiovascular;  Laterality: N/A;  . LEFT HEART CATH AND CORONARY ANGIOGRAPHY N/A 05/14/2020   Procedure: LEFT HEART CATH AND CORONARY ANGIOGRAPHY;  Surgeon: Sherren Mocha, MD;  Location: [facility];  Service: Cardiovascular;  Laterality: N/A;  . PERIPHERAL VASCULAR CATHETERIZATION N/A 05/13/2016   Procedure:  Dialysis/Perma Catheter Insertion;  Surgeon: Serafina Mitchell, MD;  Location: [place] [facility];  Service: Cardiovascular;  Laterality: N/A;  . REVISION OF ARTERIOVENOUS GORETEX GRAFT Left 02/09/6062   Procedure: PLICATION OF ARTERIOVENOUS FISTULA LEFT ARM;  Surgeon: Elam Dutch, MD;  Location: [facility];  Service: Vascular;  Laterality: Left;    Family History: Family History  Problem Relation Age of Onset  . Emphysema Mother   . Cirrhosis Father     Social History: Social History   Socioeconomic History  . Marital status: Divorced    Spouse name: Not on file  . Number of children: Not on file  . Years of education: Not on file  . Highest education level: Not on file  Occupational History  . Occupation: retired  Tobacco Use  . Smoking status: Former Smoker    Packs/day: 2.00    Years: 48.00    Pack years: 96.00    Types: Cigars    Quit date: 10/27/2017    Years since quitting: 2.9  . Smokeless tobacco: Former Systems developer    Types: Secondary school teacher  . Vaping Use: Never used  Substance and Sexual Activity  . Alcohol use: Not Currently    Comment: h/o very heavy use, quit about 2019  . Drug use: No  . Sexual activity: Not Currently  Other Topics Concern  . Not on file  Social History Narrative  . Not on file   Social Determinants of Health   Financial Resource Strain:   . Difficulty of Paying Living Expenses: Not on file  Food Insecurity:   . Worried About Charity fundraiser in the Last Year: Not on file  . Ran Out of Food in the Last Year: Not on file  Transportation Needs:   . Lack of Transportation (Medical): Not on file  . Lack of Transportation (Non-Medical): Not on file  Physical Activity:   . Days of Exercise per Week: Not on file  . Minutes of Exercise per Session: Not on file  Stress:   . Feeling of Stress : Not on file  Social Connections:   . Frequency of Communication with Friends and Family: Not on file  . Frequency of Social Gatherings with Friends  and Family: Not on file  . Attends Religious Services: Not on file  . Active Member of Clubs or Organizations: Not on file  . Attends Archivist Meetings: Not on file  . Marital Status: Not on file    Allergies:  No Known Allergies   Review of Systems: [y] = yes, [ ]  = no   . General: Weight gain [ ] ; Weight loss [ ] ; Anorexia [ ] ; Fatigue [ ] ; Fever [ ] ; Chills [ ] ; Weakness [ ]   . Cardiac: Chest pain/pressure [Y]; Resting SOB [ ] ; Exertional SOB [ ] ; Orthopnea [ ] ; Pedal Edema [ ] ; Palpitations [ ] ; Syncope [ ] ; Presyncope [ ] ; Paroxysmal nocturnal dyspnea[ ]   . Pulmonary: Cough [ ] ; Wheezing[ ] ; Hemoptysis[ ] ;  Sputum [ ] ; Snoring [ ]   . GI: Vomiting[Y]; Dysphagia[ ] ; Melena[ ] ; Hematochezia [ ] ; Heartburn[ ] ; Abdominal pain [ ] ; Constipation [ ] ; Diarrhea [ ] ; BRBPR [ ]   . GU: Hematuria[ ] ; Dysuria [ ] ; Nocturia[ ]   . Vascular: Pain in legs with walking [ ] ; Pain in feet with lying flat [ ] ; Non-healing sores [ ] ; Stroke [ ] ; TIA [ ] ; Slurred speech [ ] ;  . Neuro: Headaches[ ] ; Vertigo[ ] ; Seizures[ ] ; Paresthesias[ ] ;Blurred vision [ ] ; Diplopia [ ] ; Vision changes [ ]   . Ortho/Skin: Arthritis [ ] ; Joint pain [ ] ; Muscle pain [ ] ; Joint swelling [ ] ; Back Pain [ ] ; Rash [ ]   . Psych: Depression[ ] ; Anxiety[ ]   . Heme: Bleeding problems [ ] ; Clotting disorders [ ] ; Anemia [ ]   . Endocrine: Diabetes [ ] ; Thyroid dysfunction[ ]      Objective:    Vital Signs:   Temp:  [97.6 F (36.4 C)-98.7 F (37.1 C)] 98.6 F (37 C) (12/07 2129) Pulse Rate:  [62-79] 79 (12/07 2129) Resp:  [16-24] 18 (12/07 2130) BP: (120-203)/(74-139) 203/97 (12/07 2130) SpO2:  [94 %-100 %] 96 % (12/07 2129) Weight:  [100 kg] 100 kg (12/07 2129)    Weight change: Filed Weights   10/02/20 2129  Weight: 100 kg    Intake/Output:  No intake or output data in the 24 hours ending 10/02/20 2235    Physical Exam    General:  Well appearing. No resp difficulty HEENT: normal Neck: supple. JVP .  Carotids 2+ bilat; no bruits. No lymphadenopathy or thyromegaly appreciated. Cor: PMI nondisplaced. Regular rate & rhythm. No rubs, gallops or murmurs. Lungs: clear Abdomen: soft, nontender, nondistended. No hepatosplenomegaly. No bruits or masses. Good bowel sounds. Extremities: no cyanosis, clubbing, rash, AV fistula left arm Neuro: alert & orientedx3, cranial nerves grossly intact. moves all 4 extremities w/o difficulty. Affect pleasant    Labs   Basic Metabolic Panel: Recent Labs  Lab 09/29/20 1229 09/29/20 2111 10/02/20 0102  NA 139 139 140  K 4.3 4.6 4.7  CL 96* 95* 97*  CO2 23 23 20*  GLUCOSE 105* 87 86  BUN 67* 67* 95*  CREATININE 14.39* 14.67* 19.46*  CALCIUM 9.2 9.4 8.6*    Liver Function Tests: No results for input(s): AST, ALT, ALKPHOS, BILITOT, PROT, ALBUMIN in the last 168 hours. No results for input(s): LIPASE, AMYLASE in the last 168 hours. No results for input(s): AMMONIA in the last 168 hours.  CBC: Recent Labs  Lab 09/29/20 1229 09/29/20 2111 10/02/20 0102  WBC 8.0 7.2 8.4  HGB 11.3* 11.7* 9.9*  HCT 33.8* 34.7* 29.4*  MCV 94.7 94.6 93.6  PLT 225 240 242    Cardiac Enzymes: No results for input(s): CKTOTAL, CKMB, CKMBINDEX, TROPONINI in the last 168 hours.  BNP: BNP (last 3 results) No results for input(s): BNP in the last 8760 hours.  ProBNP (last 3 results) No results for input(s): PROBNP in the last 8760 hours.   CBG: No results for input(s): GLUCAP in the last 168 hours.  Coagulation Studies: No results for input(s): LABPROT, INR in the last 72 hours.   Imaging   DG Chest 2 View  Result Date: 10/02/2020 CLINICAL DATA:  Chest pain EXAM: CHEST - 2 VIEW COMPARISON:  September 29, 2020 FINDINGS: The heart size and mediastinal contours are within normal limits. Aortic knob calcifications are seen. Both lungs are clear. The visualized skeletal structures are unremarkable. IMPRESSION: No active cardiopulmonary  disease. Electronically  Signed   By: Prudencio Pair M.D.   On: 10/02/2020 01:17      Medications:     Current Medications:   Infusions: . heparin 1,200 Units/hr (10/02/20 2155)       Assessment/Plan   1. Non-ST elevation myocardial infarction  -Trend cardiac biomarkers -Serial ECGs -ASA 81 mg daily -Unfractionated heparin IV infusion for 48 hours -Nitroglycerin S/L or IV as needed for chest pain -Dialysis for BP control/volume management (patient missed dialysis on Sat and Tue) -Medication non-compliance.... restart Norvasc, atorvastatin, Plavix, Imdur and Toprol XL  Consider palliative care consult in the morning to help with long term chronic management beyond hospital discharge in the future.     Meade Maw, MD  10/02/2020, 10:35 PM  Cardiology Overnight Team Please contact [facility] Cardiology for night-coverage after hours (4p -7a ) and weekends on [email or web address]

## 2020-10-02 NOTE — H&P (Addendum)
History and Physical    Kenneth [name] TDV:[id number] DOB: [birth year] DOA: 10/02/2020  PCP: [name], [name], MD Patient coming from: Home  Chief Complaint: Chest pain  HPI: Kenneth [name] is a 64 y.o. male with medical history significant of complex severe multivessel CAD per [facility] 04/2020 felt not to be a candidate for PCI or CABG and being managed medically by cardiology, A. fib/flutter not a candidate for anticoagulation due to noncompliance, PEA arrest in May 2019, chronic combined systolic and diastolic CHF (EF 06-26% and G1DD on echo done 04/2020), ESRD on HD TTS, hypertension, hyperlipidemia, previous heavy alcohol use, previous tobacco use presented to the ED earlier today via EMS with complaints of an isolated episode of chest pain associated with eating which resolved prior to ED arrival. Took aspirin 324 mg at home. Work-up was not concerning for ACS at that time -EKG with no acute changes and troponins mildly elevated but flat.  He was discharged from the ED with plan to go to [facility] kidney center for routine dialysis.  Patient returned to the ED just a few hours later via EMS with complaints of chest pain. Patient states that after he was discharged from the ED he went home and ate ice. Soon after he started experiencing left-sided throbbing chest pain associated with nausea and vomiting which made him return to the ED. States his chest pain did get better after he received sublingual nitroglycerin but has now started again and is 5 out of 10 in intensity. Denies abdominal pain. He is no longer nauseous or vomiting. States his last dialysis session was on Thursday last week. He missed dialysis on Saturday and today. Patient his blood pressure is always high and he is not sure which medications as his daughter manages them.  ED Course: Blood pressure elevated with systolic in the 948-546 range.  Remainder of vital signs stable.    Labs done during ED visit this morning: WBC 8.4, hemoglobin  9.9 (baseline 10-11 range), platelet 242K.  Sodium 140, potassium 4.7, chloride 97, bicarb 20, BUN 95, creatinine 19.4, glucose 86. High-sensitivity troponin 58 >66.  EKG showing T wave inversions in inferior and lateral leads similar to prior tracings.  Chest x-ray showing no active cardiopulmonary disease.  Repeat labs done during ED visit now showing high sensitive troponin 1721.  Repeat EKG showing T wave inversions in inferior and lateral leads similar to prior tracings and new ST depressions in lateral leads.  Screening SARS-CoV-2 PCR test and influenza panel both negative.  ED provider discussed the case with Dr. De Nurse from cardiology who will consult.  He recommended blood pressure management and starting heparin for anticoagulation.  Patient was started on IV heparin and given sublingual nitroglycerin and IV hydralazine 10 mg.  Review of Systems:  All systems reviewed and apart from history of presenting illness, are negative.  Past Medical History:  Diagnosis Date  . Acute CHF ([place]) 01/2018  . Cardiomyopathy secondary    likely related to HTN heart disease; possibly ETOH related as well  . Chronic combined systolic and diastolic heart failure (HCC)    Echocardiogram 09/22/11: Moderate LVH, EF 27-03%, grade 3 diastolic dysfunction, mild MR, moderate to severe LAE, mild RVE, mild to moderate TR, small to moderate pericardial effusion  . Coronary artery disease    not felt to be candidate for CABG 08/2018; medical thearpy recomended due to high risk of PCI   . Dysrhythmia    aflutter 04/2016, afib 02/2018, not felt to be  a candidate for anticoagulation due to non-compliance and ETOH  . ESRD (end stage renal disease) on dialysis ([place])    due to hypertensive nephrosclerosis; TTS; [address] (09/01/2018)  . History of alcohol abuse   . Hypertension   . Myocardial infarction [facility])    " mild " per daughter  . PEA (Pulseless electrical activity) ([place])    PEA arrest 03/09/18, treated  empirically for hyperkalemia, shock x1 for WCT, given amiodarone, ROSC after 10 min of ACLS    Past Surgical History:  Procedure Laterality Date  . AV FISTULA PLACEMENT Left 05/14/2016   Procedure: LEFT ARM BASILIC VEIN TRANSPOSITION;  Surgeon: Rosetta Posner, MD;  Location: [place];  Service: Vascular;  Laterality: Left;  . INSERTION OF DIALYSIS CATHETER N/A 06/20/2019   Procedure: INSERTION OF TUNNELED  DIALYSIS CATHETER;  Surgeon: Elam Dutch, MD;  Location: [place];  Service: Vascular;  Laterality: N/A;  . LEFT HEART CATH AND CORONARY ANGIOGRAPHY N/A 09/02/2018   Procedure: LEFT HEART CATH AND CORONARY ANGIOGRAPHY;  Surgeon: Nelva Bush, MD;  Location: [place] CV LAB;  Service: Cardiovascular;  Laterality: N/A;  . LEFT HEART CATH AND CORONARY ANGIOGRAPHY N/A 05/14/2020   Procedure: LEFT HEART CATH AND CORONARY ANGIOGRAPHY;  Surgeon: Sherren Mocha, MD;  Location: [place] CV [facility];  Service: Cardiovascular;  Laterality: N/A;  . PERIPHERAL VASCULAR CATHETERIZATION N/A 05/13/2016   Procedure: Dialysis/Perma Catheter Insertion;  Surgeon: Serafina Mitchell, MD;  Location: [place] CV LAB;  Service: Cardiovascular;  Laterality: N/A;  . REVISION OF ARTERIOVENOUS GORETEX GRAFT Left 1/44/8185   Procedure: PLICATION OF ARTERIOVENOUS FISTULA LEFT ARM;  Surgeon: Elam Dutch, MD;  Location: [facility] OR;  Service: Vascular;  Laterality: Left;     reports that he quit smoking about 2 years ago. His smoking use included cigars. He has a 96.00 pack-year smoking history. He has quit using smokeless tobacco.  His smokeless tobacco use included chew. He reports previous alcohol use. He reports that he does not use drugs.  No Known Allergies  Family History  Problem Relation Age of Onset  . Emphysema Mother   . Cirrhosis Father     Prior to Admission medications   Medication Sig Start Date End Date Taking? Authorizing Provider  amLODipine (NORVASC) 10 MG tablet Take 1 tablet (10 mg total) by  mouth daily. 09/24/20   Dwyane Dee, MD  aspirin EC 81 MG EC tablet Take 1 tablet (81 mg total) by mouth daily. Swallow whole. 05/16/20   Alma Friendly, MD  atorvastatin (LIPITOR) 80 MG tablet Take 1 tablet (80 mg total) by mouth daily. 03/16/20   [name], [name], MD  clopidogrel (PLAVIX) 75 MG tablet Take 1 tablet by mouth once daily Patient taking differently: Take 75 mg by mouth daily.  05/17/20   Almyra Deforest, PA  diphenhydramine-acetaminophen (TYLENOL PM EXTRA STRENGTH) 25-500 MG TABS tablet Take 2 tablets by mouth at bedtime as needed (Pain, Sleep).  05/12/18   [provider]  isosorbide mononitrate (IMDUR) 30 MG 24 hr tablet Take 3 tablets (90 mg total) by mouth daily. 09/24/20   Dwyane Dee, MD  metoprolol succinate (TOPROL-XL) 25 MG 24 hr tablet Take 1 tablet (25 mg total) by mouth 4 (four) times a week. Take on Sunday, Monday, Wednesday, Friday 06/26/20   [name], [name], MD  nitroGLYCERIN (NITROSTAT) 0.4 MG SL tablet DISSOLVE ONE TABLET UNDER THE TONGUE EVERY 5 MINUTES AS NEEDED FOR CHEST PAIN.  DO NOT EXCEED A TOTAL OF  3 DOSES IN 15 MINUTES Patient taking differently: Place 0.4 mg under the tongue every 5 (five) minutes as needed for chest pain.  09/04/20   [name], [name], MD    Physical Exam: Vitals:   10/02/20 2115 10/02/20 2129 10/02/20 2130 10/02/20 2231  BP: (!) 203/120 (!) 203/97 (!) 203/97 (!) 202/97  Pulse:  79  100  Resp: 16 16 18 20   Temp:  98.6 F (37 C)    TempSrc:  Oral    SpO2: 97% 96%  99%  Weight:  100 kg    Height:  5\' 10"  (1.778 m)      Physical Exam Constitutional:      General: He is not in acute distress. HENT:     Head: Normocephalic and atraumatic.  Eyes:     Extraocular Movements: Extraocular movements intact.     Conjunctiva/sclera: Conjunctivae normal.  Cardiovascular:     Rate and Rhythm: Normal rate and regular rhythm.     Pulses: Normal pulses.  Pulmonary:     Effort: Pulmonary effort is normal. No respiratory distress.      Breath sounds: Normal breath sounds. No wheezing or rales.  Abdominal:     General: Bowel sounds are normal. There is no distension.     Palpations: Abdomen is soft.     Tenderness: There is no abdominal tenderness.  Musculoskeletal:        General: No swelling or tenderness.     Cervical back: Normal range of motion and neck supple.  Skin:    General: Skin is warm and dry.  Neurological:     General: No focal deficit present.     Mental Status: He is alert and oriented to person, place, and time.     Labs on Admission: I have personally reviewed following labs and imaging studies  CBC: Recent Labs  Lab 09/29/20 1229 09/29/20 2111 10/02/20 0102  WBC 8.0 7.2 8.4  HGB 11.3* 11.7* 9.9*  HCT 33.8* 34.7* 29.4*  MCV 94.7 94.6 93.6  PLT 225 240 962   Basic Metabolic Panel: Recent Labs  Lab 09/29/20 1229 09/29/20 2111 10/02/20 0102  NA 139 139 140  K 4.3 4.6 4.7  CL 96* 95* 97*  CO2 23 23 20*  GLUCOSE 105* 87 86  BUN 67* 67* 95*  CREATININE 14.39* 14.67* 19.46*  CALCIUM 9.2 9.4 8.6*   GFR: Estimated Creatinine Clearance: 4.4 mL/min (A) (by C-G formula based on SCr of 19.46 mg/dL (H)). Liver Function Tests: No results for input(s): AST, ALT, ALKPHOS, BILITOT, PROT, ALBUMIN in the last 168 hours. No results for input(s): LIPASE, AMYLASE in the last 168 hours. No results for input(s): AMMONIA in the last 168 hours. Coagulation Profile: Recent Labs  Lab 09/29/20 2111  INR 1.0   Cardiac Enzymes: No results for input(s): CKTOTAL, CKMB, CKMBINDEX, TROPONINI in the last 168 hours. BNP (last 3 results) No results for input(s): PROBNP in the last 8760 hours. HbA1C: No results for input(s): HGBA1C in the last 72 hours. CBG: No results for input(s): GLUCAP in the last 168 hours. Lipid Profile: No results for input(s): CHOL, HDL, LDLCALC, TRIG, CHOLHDL, LDLDIRECT in the last 72 hours. Thyroid Function Tests: No results for input(s): TSH, T4TOTAL, FREET4, T3FREE,  THYROIDAB in the last 72 hours. Anemia Panel: No results for input(s): VITAMINB12, FOLATE, FERRITIN, TIBC, IRON, RETICCTPCT in the last 72 hours. Urine analysis:    Component Value Date/Time   COLORURINE YELLOW 05/12/2016 0834   APPEARANCEUR CLOUDY (A) 05/12/2016  [place] 1.014 05/12/2016 0834   PHURINE 5.0 05/12/2016 0834   GLUCOSEU NEGATIVE 05/12/2016 0834   HGBUR MODERATE (A) 05/12/2016 [place] 05/12/2016 [place] 05/12/2016 0834   PROTEINUR 100 (A) 05/12/2016 0834   UROBILINOGEN 0.2 12/09/2013 0013   NITRITE NEGATIVE 05/12/2016 0834   LEUKOCYTESUR NEGATIVE 05/12/2016 0834    Radiological Exams on Admission: DG Chest 2 View  Result Date: 10/02/2020 CLINICAL DATA:  Chest pain EXAM: CHEST - 2 VIEW COMPARISON:  September 29, 2020 FINDINGS: The heart size and mediastinal contours are within normal limits. Aortic knob calcifications are seen. Both lungs are clear. The visualized skeletal structures are unremarkable. IMPRESSION: No active cardiopulmonary disease. Electronically Signed   By: Prudencio Pair M.D.   On: 10/02/2020 01:17    EKG: Independently reviewed.  Sinus rhythm, T wave inversions in inferior and lateral leads similar to prior tracings and new ST depressions in lateral leads.  Assessment/Plan Principal Problem:   NSTEMI (non-ST elevated myocardial infarction) [facility]) Active Problems:   Hypertensive emergency   Atrial fibrillation and flutter (HCC)   ESRD (end stage renal disease) ([place])   Dyslipidemia   NSTEMI in the setting of known complex severe multivessel CAD Patient has a history of complex severe multivessel CAD per [facility] 04/2020 felt not to be a candidate for PCI or CABG and being managed medically by cardiology.  He presented to the ED earlier this morning with complaints of an isolated episode of chest pain associated with eating which had resolved prior to ED arrival.  Work-up at that time was not concerning for ACS -EKG  without acute changes and troponins mildly elevated but flat (58>66).  He was discharged from the ED but returned just a few hours later with complaints of chest pain.  Repeat labs not showing high-sensitivity troponin elevated at 1721.  Repeat EKG showing T wave inversions in inferior and lateral leads similar to prior tracings and new ST depressions in lateral leads. ED provider discussed the case with Dr. De Nurse from cardiology who will consult.  He recommended blood pressure management and starting heparin for anticoagulation. -Admit to progressive care unit.  Continue heparin for anticoagulation.  Sublingual nitroglycerin as needed for chest pain.  Trend troponin and serial EKGs. Cardiology will consult, appreciate recommendations.  Hypertensive emergency In the setting of missed dialysis x2 and possible medication nonadherence. Patient is not sure about which antihypertensives he takes. Blood pressure elevated with systolic in the 341-962 range. Chest pain and troponin elevation concerning for ACS. No signs of pulmonary edema on chest x-ray.  Patient received IV hydralazine and sublingual nitroglycerin in the ED.  Blood pressure continues to be elevated with systolic in the 229N. -Start IV nitroglycerin. Goal blood pressure <160/ <110.  Consult nephrology in the morning for dialysis.  Hyperlipidemia -Resume statin after pharmacy med rec is done.  History of A. Fib/flutter Followed by cardiology and not a candidate for anticoagulation due to noncompliance.  Currently in sinus rhythm. -Cardiac monitoring  Chronic combined systolic and diastolic CHF EF 98-92% and G1DD on echo done 04/2020.  No signs of volume overload at this time.  ESRD on HD TTS Patient reports missing dialysis on Saturday and today. Blood pressure significantly elevated. Potassium normal and no significant acidosis on labs. -Consult nephrology in a.m. for dialysis.  DVT prophylaxis: Heparin Code Status: Patient wishes to  be full code. Family Communication: No family available at this time. Disposition Plan: Status is: Inpatient  Remains inpatient appropriate because:IV treatments appropriate due to intensity of illness or inability to take PO, Inpatient level of care appropriate due to severity of illness and Needs dialysis   Dispo: The patient is from: Home              Anticipated d/c is to: Home              Anticipated d/c date is: 3 days              Patient currently is not medically stable to d/c.  The medical decision making on this patient was of high complexity and the patient is at high risk for clinical deterioration, therefore this is a level 3 visit.  Shela Leff MD [facility]  If 7PM-7AM, please contact night-coverage [email or web address]  10/02/2020, 10:49 PM

## 2020-10-02 NOTE — ED Provider Notes (Signed)
[facility] EMERGENCY DEPARTMENT Provider Note   CSN: [id number] Arrival date & time: 10/02/20  1522     History Chief Complaint  Patient presents with  . Chest Pain    Kenneth [name] is a 64 y.o. male.  He has a history of coronary disease cardiomyopathy end-stage renal disease on dialysis.  He was seen earlier today for chest pain.  He had flat troponins and was discharged home but he said when he returned home he started getting the chest pain again.  He says he always gets chest pain when he is at home.  He also has not gone to dialysis in over a week.  He was last admitted about a week ago for elevated troponin.  Cardiology did consult at that time and recommended medical management.  Currently rates his chest pain is 1 out of 10.  States he does not have any nitroglycerin at home.  The history is provided by the patient.  Chest Pain Pain location:  Substernal area Pain quality: aching   Pain radiates to:  Does not radiate Pain severity:  Moderate Onset quality:  Gradual Progression:  Partially resolved Chronicity:  Chronic Context: at rest   Relieved by:  None tried Worsened by:  Nothing Ineffective treatments:  None tried Associated symptoms: back pain   Associated symptoms: no abdominal pain, no cough, no diaphoresis, no fever, no headache, no nausea, no shortness of breath and no vomiting   Risk factors: coronary artery disease, diabetes mellitus and hypertension        Past Medical History:  Diagnosis Date  . Acute CHF ([place]) 01/2018  . Cardiomyopathy secondary    likely related to HTN heart disease; possibly ETOH related as well  . Chronic combined systolic and diastolic heart failure (HCC)    Echocardiogram 09/22/11: Moderate LVH, EF 37-90%, grade 3 diastolic dysfunction, mild MR, moderate to severe LAE, mild RVE, mild to moderate TR, small to moderate pericardial effusion  . Coronary artery disease    not felt to be candidate for CABG 08/2018;  medical thearpy recomended due to high risk of PCI   . Dysrhythmia    aflutter 04/2016, afib 02/2018, not felt to be a candidate for anticoagulation due to non-compliance and ETOH  . ESRD (end stage renal disease) on dialysis ([place])    due to hypertensive nephrosclerosis; TTS; [address] (09/01/2018)  . History of alcohol abuse   . Hypertension   . Myocardial infarction [facility])    " mild " per daughter  . PEA (Pulseless electrical activity) ([place])    PEA arrest 03/09/18, treated empirically for hyperkalemia, shock x1 for WCT, given amiodarone, ROSC after 10 min of ACLS    Patient Active Problem List   Diagnosis Date Noted  . Chest pain of uncertain etiology 24/06/7352  . Accelerated hypertension 09/24/2020  . Dyslipidemia 09/24/2020  . NSTEMI (non-ST elevated myocardial infarction) ([place]) 05/12/2020  . Non-ST elevated myocardial infarction ([place]) 05/12/2020  . DNR (do not resuscitate) discussion   . Palliative care by specialist   . Goals of care, counseling/discussion   . Coronary artery disease involving native heart with angina pectoris ([place])   . STEMI (ST elevation myocardial infarction) ([place]) 09/17/2018  . Unstable angina ([place])   . ACS (acute coronary syndrome) ([place]) 09/01/2018  . ESRD (end stage renal disease) ([place]) 07/20/2018  . Acute respiratory failure ([place]) 05/11/2018  . Cardiac arrest ([place]) 03/09/2018  . Acute encephalopathy   . Acute on chronic respiratory failure (  [place]) 02/16/2018  . Tobacco dependence 02/16/2018  . Pulmonary edema 01/26/2018  . Acute respiratory failure with hypoxia ([place]) 01/26/2018  . Atrial fibrillation and flutter ([place])   . Shortness of breath   . Acute on chronic diastolic CHF (congestive heart failure) ([place])   . Hypothermia 10/08/2016  . ESRD on hemodialysis ([place]) 10/08/2016  . Fall 10/08/2016  . Syncope 10/07/2016  . Near syncope 07/07/2016  . Cardiomyopathy ([place]) 07/07/2016  . Ventricular tachycardia, non-sustained ([place]) 07/06/2016  .  Hypoglycemia 07/06/2016  . CKD (chronic kidney disease) stage 5, GFR less than 15 ml/min (HCC)   . Alcoholism ([place]) 05/13/2016  . Hypertensive emergency 12/08/2013  . Cardiomyopathy secondary 10/09/2011  . Alcohol withdrawal delirium ([place]) 09/25/2011  . Chronic diastolic CHF (congestive heart failure) ([place]) 09/24/2011    Past Surgical History:  Procedure Laterality Date  . AV FISTULA PLACEMENT Left 05/14/2016   Procedure: LEFT ARM BASILIC VEIN TRANSPOSITION;  Surgeon: Rosetta Posner, MD;  Location: [facility];  Service: Vascular;  Laterality: Left;  . INSERTION OF DIALYSIS CATHETER N/A 06/20/2019   Procedure: INSERTION OF TUNNELED  DIALYSIS CATHETER;  Surgeon: Elam Dutch, MD;  Location: [facility];  Service: Vascular;  Laterality: N/A;  . LEFT HEART CATH AND CORONARY ANGIOGRAPHY N/A 09/02/2018   Procedure: LEFT HEART CATH AND CORONARY ANGIOGRAPHY;  Surgeon: Nelva Bush, MD;  Location: [facility] [place] CV LAB;  Service: Cardiovascular;  Laterality: N/A;  . LEFT HEART CATH AND CORONARY ANGIOGRAPHY N/A 05/14/2020   Procedure: LEFT HEART CATH AND CORONARY ANGIOGRAPHY;  Surgeon: Sherren Mocha, MD;  Location: [facility] CV LAB;  Service: Cardiovascular;  Laterality: N/A;  . PERIPHERAL VASCULAR CATHETERIZATION N/A 05/13/2016   Procedure: Dialysis/Perma Catheter Insertion;  Surgeon: Serafina Mitchell, MD;  Location: [facility] CV LAB;  Service: Cardiovascular;  Laterality: N/A;  . REVISION OF ARTERIOVENOUS GORETEX GRAFT Left 6/38/7564   Procedure: PLICATION OF ARTERIOVENOUS FISTULA LEFT ARM;  Surgeon: Elam Dutch, MD;  Location[facility];  Service: Vascular;  Laterality: Left;       Family History  Problem Relation Age of Onset  . Emphysema Mother   . Cirrhosis Father     Social History   Tobacco Use  . Smoking status: Former Smoker    Packs/day: 2.00    Years: 48.00    Pack years: 96.00    Types: Cigars    Quit date: 10/27/2017    Years since quitting: 2.9  . Smokeless tobacco: Former  Systems developer    Types: Secondary school teacher  . Vaping Use: Never used  Substance Use Topics  . Alcohol use: Not Currently    Comment: h/o very heavy use, quit about 2019  . Drug use: No    Home Medications Prior to Admission medications   Medication Sig Start Date End Date Taking? Authorizing Provider  amLODipine (NORVASC) 10 MG tablet Take 1 tablet (10 mg total) by mouth daily. 09/24/20   Dwyane Dee, MD  aspirin EC 81 MG EC tablet Take 1 tablet (81 mg total) by mouth daily. Swallow whole. 05/16/20   Alma Friendly, MD  atorvastatin (LIPITOR) 80 MG tablet Take 1 tablet (80 mg total) by mouth daily. 03/16/20   [name], [name], MD  clopidogrel (PLAVIX) 75 MG tablet Take 1 tablet by mouth once daily Patient taking differently: Take 75 mg by mouth daily.  05/17/20   Almyra Deforest, PA  diphenhydramine-acetaminophen (TYLENOL PM EXTRA STRENGTH) 25-500 MG TABS tablet Take 2 tablets by mouth at bedtime  as needed (Pain, Sleep).  05/12/18   [provider]  isosorbide mononitrate (IMDUR) 30 MG 24 hr tablet Take 3 tablets (90 mg total) by mouth daily. 09/24/20   Dwyane Dee, MD  metoprolol succinate (TOPROL-XL) 25 MG 24 hr tablet Take 1 tablet (25 mg total) by mouth 4 (four) times a week. Take on Sunday, Monday, Wednesday, Friday 06/26/20   [name], [name], MD  nitroGLYCERIN (NITROSTAT) 0.4 MG SL tablet DISSOLVE ONE TABLET UNDER THE TONGUE EVERY 5 MINUTES AS NEEDED FOR CHEST PAIN.  DO NOT EXCEED A TOTAL OF 3 DOSES IN 15 MINUTES Patient taking differently: Place 0.4 mg under the tongue every 5 (five) minutes as needed for chest pain.  09/04/20   [name], [name], MD    Allergies    Patient has no known allergies.  Review of Systems   Review of Systems  Constitutional: Negative for diaphoresis and fever.  HENT: Negative for sore throat.   Eyes: Negative for visual disturbance.  Respiratory: Negative for cough and shortness of breath.   Cardiovascular: Positive for chest pain.  Gastrointestinal:  Negative for abdominal pain, nausea and vomiting.  Genitourinary: Negative for dysuria.  Musculoskeletal: Positive for back pain.  Skin: Negative for rash.  Neurological: Negative for headaches.    Physical Exam Updated Vital Signs BP (!) 197/85 (BP Location: Right Arm)   Pulse 66   Temp 97.6 F (36.4 C) (Oral)   Resp 16   SpO2 98%   Physical Exam Vitals and nursing note reviewed.  Constitutional:      Appearance: He is well-developed.  HENT:     Head: Normocephalic and atraumatic.  Eyes:     Conjunctiva/sclera: Conjunctivae normal.  Cardiovascular:     Rate and Rhythm: Normal rate and regular rhythm.     Heart sounds: No murmur heard.   Pulmonary:     Effort: Pulmonary effort is normal. No respiratory distress.     Breath sounds: Normal breath sounds.  Abdominal:     Palpations: Abdomen is soft.     Tenderness: There is no abdominal tenderness.  Musculoskeletal:        General: Normal range of motion.     Cervical back: Neck supple.     Right lower leg: No tenderness.     Left lower leg: No tenderness.  Skin:    General: Skin is warm and dry.     Capillary Refill: Capillary refill takes less than 2 seconds.  Neurological:     General: No focal deficit present.     Mental Status: He is alert.     ED Results / Procedures / Treatments   Labs (all labs ordered are listed, but only abnormal results are displayed) Labs Reviewed  TROPONIN I (HIGH SENSITIVITY) - Abnormal; Notable for the following components:      Result Value   Troponin I (High Sensitivity) 1,721 (*)    All other components within normal limits  TROPONIN I (HIGH SENSITIVITY) - Abnormal; Notable for the following components:   Troponin I (High Sensitivity) 1,991 (*)    All other components within normal limits  TROPONIN I (HIGH SENSITIVITY) - Abnormal; Notable for the following components:   Troponin I (High Sensitivity) 3,154 (*)    All other components within normal limits  TROPONIN I (HIGH  SENSITIVITY) - Abnormal; Notable for the following components:   Troponin I (High Sensitivity) 4,408 (*)    All other components within normal limits  RESP PANEL BY RT-PCR (FLU A&B, COVID)  ARPGX2  HEPARIN LEVEL (UNFRACTIONATED)  CBC WITH DIFFERENTIAL/PLATELET  HEPARIN LEVEL (UNFRACTIONATED)  CBC WITH DIFFERENTIAL/PLATELET    EKG EKG Interpretation  Date/Time:  Tuesday October 02 2020 15:30:17 EST Ventricular Rate:  64 PR Interval:  178 QRS Duration: 96 QT Interval:  442 QTC Calculation: 455 R Axis:   -16 Text Interpretation: Normal sinus rhythm Left ventricular hypertrophy with repolarization abnormality ( Cornell product ) Abnormal ECG No significant change since prior today Confirmed by Aletta Edouard [phone]) on 10/02/2020 7:02:49 PM   Radiology DG Chest 2 View  Result Date: 10/02/2020 CLINICAL DATA:  Chest pain EXAM: CHEST - 2 VIEW COMPARISON:  September 29, 2020 FINDINGS: The heart size and mediastinal contours are within normal limits. Aortic knob calcifications are seen. Both lungs are clear. The visualized skeletal structures are unremarkable. IMPRESSION: No active cardiopulmonary disease. Electronically Signed   By: Prudencio Pair M.D.   On: 10/02/2020 01:17    Procedures .Critical Care Performed by: Hayden Rasmussen, MD Authorized by: Hayden Rasmussen, MD   Critical care provider statement:    Critical care time (minutes):  45   Critical care time was exclusive of:  Separately billable procedures and treating other patients   Critical care was necessary to treat or prevent imminent or life-threatening deterioration of the following conditions:  Cardiac failure   Critical care was time spent personally by me on the following activities:  Discussions with consultants, evaluation of patient's response to treatment, examination of patient, ordering and performing treatments and interventions, ordering and review of laboratory studies, ordering and review of radiographic  studies, pulse oximetry, re-evaluation of patient's condition, obtaining history from patient or surrogate, review of old charts and development of treatment plan with patient or surrogate   (including critical care time)  Medications Ordered in ED Medications  heparin ADULT infusion 100 units/mL (25000 units/232mL sodium chloride 0.45%) (1,200 Units/hr Intravenous Rate/Dose Verify 10/03/20 0942)  acetaminophen (TYLENOL) tablet 650 mg (has no administration in time range)  ondansetron (ZOFRAN) injection 4 mg (has no administration in time range)  nitroGLYCERIN 50 mg in dextrose 5 % 250 mL (0.2 mg/mL) infusion (20 mcg/min Intravenous New Bag/Given 10/03/20 0416)  aspirin EC tablet 81 mg (has no administration in time range)  calcitRIOL (ROCALTROL) capsule 2.25 mcg (has no administration in time range)  cinacalcet (SENSIPAR) tablet 30 mg (has no administration in time range)  nitroGLYCERIN (NITROSTAT) SL tablet 0.4 mg (0.4 mg Sublingual Given 10/02/20 1936)  hydrALAZINE (APRESOLINE) injection 10 mg (10 mg Intravenous Given 10/02/20 2150)  heparin bolus via infusion 4,000 Units (4,000 Units Intravenous Bolus from Bag 10/02/20 2155)    ED Course  I have reviewed the triage vital signs and the nursing notes.  Pertinent labs & imaging results that were available during my care of the patient were reviewed by me and considered in my medical decision making (see chart for details).  Clinical Course as of Oct 02 2145  Tue Oct 02, 2020  2109 Patient is currently pain-free.  His first troponin came back at 1721.  I have asked that the nurses get the patient into her room and put on a cardiac monitor.  Have placed a consult to cardiology.   [MB]  2134 Discussed with cardiology fellow Dr. De Nurse.  He reviewed patient's EKG and prior work-ups.  He agrees with starting the patient on heparin and recommend something IV for blood pressure control.  He will consult on the patient.   [MB]  2145 Discussed with  [facility] hospitalist Dr. Marlowe Sax who will evaluate the patient for admission.   [MB]    Clinical Course User Index [MB] Hayden Rasmussen, MD   MDM Rules/Calculators/A&P                         This patient complains of chest pain; this involves an extensive number of treatment Options and is a complaint that carries with it a high risk of complications and Morbidity. The differential includes ACS, pneumonia, PE, reflux, musculoskeletal, vascular  I ordered, reviewed and interpreted labs, which included troponin which was markedly elevated from prior earlier today I ordered medication nitroglycerin, IV heparin, IV hydralazine I independently visualized and interpreted imaging done earlier today which showed no acute pulmonary disease Previous records obtained and reviewed in epic including prior ED visit today I consulted cardiology Dr. Paticia Stack and [facility] hospitalist Dr. Marlowe Sax and discussed lab and imaging findings  Critical Interventions: Identification and management of patient's ACS with IV heparin and admission to the hospital  After the interventions stated above, I reevaluated the patient and found patient to be pain-free.  Remains hypertensive and have ordered IV hydralazine.  Patient agreeable to admission   Final Clinical Impression(s) / ED Diagnoses Final diagnoses:  NSTEMI (non-ST elevated myocardial infarction) [facility])  Hypertensive urgency    Rx / DC Orders ED Discharge Orders    None       Hayden Rasmussen, MD 10/03/20 1146

## 2020-10-02 NOTE — ED Triage Notes (Signed)
Pt here via EMS after being discharged and provided a taxi voucher to his home this morning after negative workup. Has not been to dialysis in 7 days. Was supposed to call dialysis center to reschedule dialysis but came back to the  ER instead.

## 2020-10-02 NOTE — ED Provider Notes (Signed)
[facility] EMERGENCY DEPARTMENT Provider Note   CSN: [id number] Arrival date & time: 10/02/20  0048     History Chief Complaint  Patient presents with  . Chest Pain    Kenneth [name] is a 64 y.o. male.  HPI   64 year old male with past medical history of CAD status post MI/PEA, HTN, CHF, ESRD on HD TTS presents to the emergency department with reported chest pain.  Patient states yesterday while eating he had midsternal sharp chest discomfort.  When he stopped eating this resolved, it was isolated.  The sharp chest pain did not radiate to his back, or abdomen.  Patient has been seen and evaluated in this department previous in the past 2 weeks for chest pain complaint.  He was admitted and evaluated.  He recently had a left heart cath earlier this year where he had diffuse cardiac disease but was not deemed a candidate for PCI or CABG and is currently being managed medically.  His last dialysis was 1 week ago 11/30 when he was discharged from his inpatient stay.  Patient was supposed to resume his normal outpatient dialysis however has been noncompliant.  He had appointment this morning but came here for evaluation otherwise.  At this time patient denies any chest pain, there has been no recurrence, no shortness of breath, cough.  He denies any swelling in his lower extremities.  No recent fever or acute illness.  Past Medical History:  Diagnosis Date  . Acute CHF ([place]) 01/2018  . Cardiomyopathy secondary    likely related to HTN heart disease; possibly ETOH related as well  . Chronic combined systolic and diastolic heart failure (HCC)    Echocardiogram 09/22/11: Moderate LVH, EF 36-62%, grade 3 diastolic dysfunction, mild MR, moderate to severe LAE, mild RVE, mild to moderate TR, small to moderate pericardial effusion  . Coronary artery disease    not felt to be candidate for CABG 08/2018; medical thearpy recomended due to high risk of PCI   . Dysrhythmia    aflutter  04/2016, afib 02/2018, not felt to be a candidate for anticoagulation due to non-compliance and ETOH  . ESRD (end stage renal disease) on dialysis ([place])    due to hypertensive nephrosclerosis; TTS; [facility] (09/01/2018)  . History of alcohol abuse   . Hypertension   . Myocardial infarction [facility])    " mild " per daughter  . PEA (Pulseless electrical activity) ([place])    PEA arrest 03/09/18, treated empirically for hyperkalemia, shock x1 for WCT, given amiodarone, ROSC after 10 min of ACLS    Patient Active Problem List   Diagnosis Date Noted  . Chest pain of uncertain etiology 94/76/5465  . Accelerated hypertension 09/24/2020  . Dyslipidemia 09/24/2020  . NSTEMI (non-ST elevated myocardial infarction) ([place]) 05/12/2020  . Non-ST elevated myocardial infarction ([place]) 05/12/2020  . DNR (do not resuscitate) discussion   . Palliative care by specialist   . Goals of care, counseling/discussion   . Coronary artery disease involving native heart with angina pectoris ([place])   . STEMI (ST elevation myocardial infarction) ([place]) 09/17/2018  . Unstable angina ([place])   . ACS (acute coronary syndrome) ([place]) 09/01/2018  . ESRD (end stage renal disease) ([place]) 07/20/2018  . Acute respiratory failure ([place]) 05/11/2018  . Cardiac arrest ([place]) 03/09/2018  . Acute encephalopathy   . Acute on chronic respiratory failure ([place]) 02/16/2018  . Tobacco dependence 02/16/2018  . Pulmonary edema 01/26/2018  . Acute respiratory failure with hypoxia ([place])  01/26/2018  . Atrial fibrillation and flutter ([place])   . Shortness of breath   . Acute on chronic diastolic CHF (congestive heart failure) ([place])   . Hypothermia 10/08/2016  . ESRD on hemodialysis ([place]) 10/08/2016  . Fall 10/08/2016  . Syncope 10/07/2016  . Near syncope 07/07/2016  . Cardiomyopathy ([place]) 07/07/2016  . Ventricular tachycardia, non-sustained ([place]) 07/06/2016  . Hypoglycemia 07/06/2016  . CKD (chronic kidney disease) stage 5, GFR less than 15 ml/min  (HCC)   . Alcoholism ([place]) 05/13/2016  . Hypertensive emergency 12/08/2013  . Cardiomyopathy secondary 10/09/2011  . Alcohol withdrawal delirium ([place]) 09/25/2011  . Chronic diastolic CHF (congestive heart failure) ([place]) 09/24/2011    Past Surgical History:  Procedure Laterality Date  . AV FISTULA PLACEMENT Left 05/14/2016   Procedure: LEFT ARM BASILIC VEIN TRANSPOSITION;  Surgeon: Rosetta Posner, MD;  Location: [place];  Service: Vascular;  Laterality: Left;  . INSERTION OF DIALYSIS CATHETER N/A 06/20/2019   Procedure: INSERTION OF TUNNELED  DIALYSIS CATHETER;  Surgeon: Elam Dutch, MD;  Location: [facility] [place];  Service: Vascular;  Laterality: N/A;  . LEFT HEART CATH AND CORONARY ANGIOGRAPHY N/A 09/02/2018   Procedure: LEFT HEART CATH AND CORONARY ANGIOGRAPHY;  Surgeon: Nelva Bush, MD;  Location: [place] CV LAB;  Service: Cardiovascular;  Laterality: N/A;  . LEFT HEART CATH AND CORONARY ANGIOGRAPHY N/A 05/14/2020   Procedure: LEFT HEART CATH AND CORONARY ANGIOGRAPHY;  Surgeon: Sherren Mocha, MD;  Location: [place] CV LAB;  Service: Cardiovascular;  Laterality: N/A;  . PERIPHERAL VASCULAR CATHETERIZATION N/A 05/13/2016   Procedure: Dialysis/Perma Catheter Insertion;  Surgeon: Serafina Mitchell, MD;  Location: [place] CV LAB;  Service: Cardiovascular;  Laterality: N/A;  . REVISION OF ARTERIOVENOUS GORETEX GRAFT Left 7/89/3810   Procedure: PLICATION OF ARTERIOVENOUS FISTULA LEFT ARM;  Surgeon: Elam Dutch, MD;  Location: [facility];  Service: Vascular;  Laterality: Left;       Family History  Problem Relation Age of Onset  . Emphysema Mother   . Cirrhosis Father     Social History   Tobacco Use  . Smoking status: Former Smoker    Packs/day: 2.00    Years: 48.00    Pack years: 96.00    Types: Cigars    Quit date: 10/27/2017    Years since quitting: 2.9  . Smokeless tobacco: Former Systems developer    Types: Secondary school teacher  . Vaping Use: Never used  Substance Use Topics  .  Alcohol use: Not Currently    Comment: h/o very heavy use, quit about 2019  . Drug use: No    Home Medications Prior to Admission medications   Medication Sig Start Date End Date Taking? Authorizing Provider  amLODipine (NORVASC) 10 MG tablet Take 1 tablet (10 mg total) by mouth daily. 09/24/20   Dwyane Dee, MD  aspirin EC 81 MG EC tablet Take 1 tablet (81 mg total) by mouth daily. Swallow whole. 05/16/20   Alma Friendly, MD  atorvastatin (LIPITOR) 80 MG tablet Take 1 tablet (80 mg total) by mouth daily. 03/16/20   [name], [name], MD  clopidogrel (PLAVIX) 75 MG tablet Take 1 tablet by mouth once daily Patient taking differently: Take 75 mg by mouth daily.  05/17/20   Almyra Deforest, PA  diphenhydramine-acetaminophen (TYLENOL PM EXTRA STRENGTH) 25-500 MG TABS tablet Take 2 tablets by mouth at bedtime as needed (Pain, Sleep).  05/12/18   [provider]  isosorbide mononitrate (IMDUR) 30 MG 24 hr tablet  Take 3 tablets (90 mg total) by mouth daily. 09/24/20   Dwyane Dee, MD  metoprolol succinate (TOPROL-XL) 25 MG 24 hr tablet Take 1 tablet (25 mg total) by mouth 4 (four) times a week. Take on Sunday, Monday, Wednesday, Friday 06/26/20   [name], [name], MD  nitroGLYCERIN (NITROSTAT) 0.4 MG SL tablet DISSOLVE ONE TABLET UNDER THE TONGUE EVERY 5 MINUTES AS NEEDED FOR CHEST PAIN.  DO NOT EXCEED A TOTAL OF 3 DOSES IN 15 MINUTES Patient taking differently: Place 0.4 mg under the tongue every 5 (five) minutes as needed for chest pain.  09/04/20   [name], [name], MD    Allergies    Patient has no known allergies.  Review of Systems   Review of Systems  Constitutional: Negative for chills and fever.  HENT: Negative for congestion.   Eyes: Negative for visual disturbance.  Respiratory: Negative for cough, chest tightness, shortness of breath and wheezing.   Cardiovascular: Positive for chest pain. Negative for leg swelling.  Gastrointestinal: Negative for abdominal pain, diarrhea and  vomiting.  Genitourinary: Negative for dysuria.  Skin: Negative for rash.  Neurological: Negative for headaches.    Physical Exam Updated Vital Signs BP (!) 183/95   Pulse 70   Temp 98.1 F (36.7 C) (Oral)   Resp (!) 24   Ht 5\' 10"  (1.778 m)   Wt 100 kg   SpO2 99%   BMI 31.63 kg/m   Physical Exam Vitals and nursing note reviewed.  Constitutional:      Appearance: Normal appearance.  HENT:     Head: Normocephalic.     Mouth/Throat:     Mouth: Mucous membranes are moist.  Cardiovascular:     Rate and Rhythm: Normal rate.  Pulmonary:     Effort: Pulmonary effort is normal. No respiratory distress.  Abdominal:     Palpations: Abdomen is soft.     Tenderness: There is no abdominal tenderness.  Skin:    General: Skin is warm.  Neurological:     Mental Status: He is alert. Mental status is at baseline.  Psychiatric:        Mood and Affect: Mood normal.     ED Results / Procedures / Treatments   Labs (all labs ordered are listed, but only abnormal results are displayed) Labs Reviewed  BASIC METABOLIC PANEL - Abnormal; Notable for the following components:      Result Value   Chloride 97 (*)    CO2 20 (*)    BUN 95 (*)    Creatinine, Ser 19.46 (*)    Calcium 8.6 (*)    GFR, Estimated 2 (*)    Anion gap 23 (*)    All other components within normal limits  CBC - Abnormal; Notable for the following components:   RBC 3.14 (*)    Hemoglobin 9.9 (*)    HCT 29.4 (*)    RDW 16.9 (*)    All other components within normal limits  TROPONIN I (HIGH SENSITIVITY) - Abnormal; Notable for the following components:   Troponin I (High Sensitivity) 58 (*)    All other components within normal limits  TROPONIN I (HIGH SENSITIVITY) - Abnormal; Notable for the following components:   Troponin I (High Sensitivity) 66 (*)    All other components within normal limits    EKG EKG Interpretation  Date/Time:  Tuesday October 02 2020 00:50:49 EST Ventricular Rate:  71 PR  Interval:  178 QRS Duration: 90 QT Interval:  404 QTC Calculation:  439 R Axis:   -34 Text Interpretation: Sinus rhythm with Premature supraventricular complexes Left axis deviation Minimal voltage criteria for LVH, may be normal variant ( Cornell product ) Anterior infarct , age undetermined T wave abnormality, consider lateral ischemia Abnormal ECG NSR, T wave inversion v5-6 Confirmed by Lavenia Atlas [phone]) on 10/02/2020 11:15:06 AM   Radiology DG Chest 2 View  Result Date: 10/02/2020 CLINICAL DATA:  Chest pain EXAM: CHEST - 2 VIEW COMPARISON:  September 29, 2020 FINDINGS: The heart size and mediastinal contours are within normal limits. Aortic knob calcifications are seen. Both lungs are clear. The visualized skeletal structures are unremarkable. IMPRESSION: No active cardiopulmonary disease. Electronically Signed   By: Prudencio Pair M.D.   On: 10/02/2020 01:17    Procedures Procedures (including critical care time)  Medications Ordered in ED Medications - No data to display  ED Course  I have reviewed the triage vital signs and the nursing notes.  Pertinent labs & imaging results that were available during my care of the patient were reviewed by me and considered in my medical decision making (see chart for details).  Clinical Course as of Oct 02 1249  Tue Oct 02, 2020  1248 EKG is sinus rhythm, T wave flattening/inversions in V5/V6 which is baseline and present prior.  No active chest pain.  Blood work shows baseline anemia, slightly worse than baseline CKD which is to be expected predialysis, no acute electrolyte abnormalities, baseline troponin ranges from 30-60, today his troponins are in the 50 to 60s, flat with no significant delta.  Chest x-ray shows no significant fluid overload or acute abnormality.   [KH]    Clinical Course User Index [KH] Natane Heward, Alvin Critchley, DO   MDM Rules/Calculators/A&P                          Patient presented with an isolated and resolved episode  of chest pain while eating.  There is no active chest discomfort.  He is laying in bed, sleeping, comfortable and well-appearing.  Chest x-ray shows no findings of pulmonary edema or congestion.  Blood work is mostly baseline, his kidney function specifically creatinine is slightly higher than baseline this can be explained by him missing dialysis sessions including this morning.  His troponins are on the higher end of his baseline normal with no significant delta.  He has no active or acute chest pain, EKG is unchanged, troponins are flat, no indication of active ACS or cardiac pathology.  This was an isolated chest pain episode with eating.  Patient does need emergent dialysis, without any acute electrolyte abnormality or signs of CHF/respiratory distress.  I spoke to [facility] where he had his appointment this morning.  They are aware that the patient will call this afternoon to try to reschedule his dialysis later today or tomorrow morning.  I expressed the importance of the patient being compliant with his TTS dialysis, he understands.  I called the office of his nephrologist Dr. Jimmy Footman and will attempt to update them or leave a message on the patient's noncompliance.  On reevaluation patient is sleeping, comfortable.  He offers no new concerns or complaints.  Patient will be discharged. Final Clinical Impression(s) / ED Diagnoses Final diagnoses:  None    Rx / DC Orders ED Discharge Orders    None       Lorelle Gibbs, DO 10/02/20 1300

## 2020-10-02 NOTE — ED Notes (Signed)
Hospitalist at bedside 

## 2020-10-02 NOTE — Discharge Instructions (Addendum)
You have been seen and discharged from the emergency department.  Your kidney function is slightly elevated however this can be explained by your missed dialysis.  [facility] was contacted today, they are aware that you were in the emergency department and will be contacting them to set up a dialysis time today or tomorrow.  You can reach them at [phone], they are expecting your call. It is extremely important that you follow-up with your regular dialysis as scheduled.  Follow-up with your nephrologist for reevaluation. Take medications as prescribed. If you have any worsening symptoms or further concerns or health please return to emergency department for further evaluation.

## 2020-10-02 NOTE — ED Notes (Signed)
Spoke with provider about pts c/o chest pain with shortness of breath and 3rd troponin result of 1999. Provider states she will order additional meds for pt.

## 2020-10-02 NOTE — ED Notes (Signed)
Patient waiting for cab

## 2020-10-02 NOTE — Progress Notes (Signed)
ANTICOAGULATION CONSULT NOTE - Initial Consult  Pharmacy Consult for heparin Indication: chest pain/ACS  No Known Allergies  Patient Measurements:   Heparin Dosing Weight: 93.9 kg   Vital Signs: Temp: 97.6 F (36.4 C) (12/07 1528) Temp Source: Oral (12/07 1528) BP: 203/120 (12/07 2115) Pulse Rate: 62 (12/07 1935)  Labs: Recent Labs    10/02/20 0102 10/02/20 0345 10/02/20 2001  HGB 9.9*  --   --   HCT 29.4*  --   --   PLT 242  --   --   CREATININE 19.46*  --   --   TROPONINIHS 58* 66* 1,721*    Estimated Creatinine Clearance: 4.4 mL/min (A) (by C-G formula based on SCr of 19.46 mg/dL (H)).   Medical History: Past Medical History:  Diagnosis Date  . Acute CHF ([place]) 01/2018  . Cardiomyopathy secondary    likely related to HTN heart disease; possibly ETOH related as well  . Chronic combined systolic and diastolic heart failure (HCC)    Echocardiogram 09/22/11: Moderate LVH, EF 93-79%, grade 3 diastolic dysfunction, mild MR, moderate to severe LAE, mild RVE, mild to moderate TR, small to moderate pericardial effusion  . Coronary artery disease    not felt to be candidate for CABG 08/2018; medical thearpy recomended due to high risk of PCI   . Dysrhythmia    aflutter 04/2016, afib 02/2018, not felt to be a candidate for anticoagulation due to non-compliance and ETOH  . ESRD (end stage renal disease) on dialysis ([place])    due to hypertensive nephrosclerosis; TTS; [facility] (09/01/2018)  . History of alcohol abuse   . Hypertension   . Myocardial infarction [facility])    " mild " per daughter  . PEA (Pulseless electrical activity) ([place])    PEA arrest 03/09/18, treated empirically for hyperkalemia, shock x1 for WCT, given amiodarone, ROSC after 10 min of ACLS    Medications:  (Not in a hospital admission)   Assessment: 33 YOM who presents with chest pain now with significant troponin spike to start IV heparin for ACS. H/H low. Plt wnl. Of note, patient has a h/o on ESRD on  HD.   Goal of Therapy:  Heparin level 0.3-0.7 units/ml Monitor platelets by anticoagulation protocol: Yes   Plan:  -Heparin 4000 units IV bolus followed by IV heparin at 1100 units/hr -F/u 8 hr HL -Monitor daily HL, CBC and s/s of bleeding   Albertina Parr, PharmD., BCPS, BCCCP Clinical Pharmacist Please refer to [facility] for unit-specific pharmacist

## 2020-10-02 NOTE — ED Triage Notes (Signed)
Pt comes via [facility] for CP, pt is a dialysis pt, has not had it since Thursday, PTA received 324 ASA with some relief, denies SOB, some nausea

## 2020-10-03 DIAGNOSIS — N186 End stage renal disease: Secondary | ICD-10-CM

## 2020-10-03 DIAGNOSIS — I4891 Unspecified atrial fibrillation: Secondary | ICD-10-CM | POA: Diagnosis not present

## 2020-10-03 DIAGNOSIS — Z7189 Other specified counseling: Secondary | ICD-10-CM

## 2020-10-03 DIAGNOSIS — I214 Non-ST elevation (NSTEMI) myocardial infarction: Secondary | ICD-10-CM | POA: Diagnosis not present

## 2020-10-03 DIAGNOSIS — Z515 Encounter for palliative care: Secondary | ICD-10-CM | POA: Diagnosis not present

## 2020-10-03 DIAGNOSIS — I161 Hypertensive emergency: Secondary | ICD-10-CM

## 2020-10-03 DIAGNOSIS — I4892 Unspecified atrial flutter: Secondary | ICD-10-CM

## 2020-10-03 DIAGNOSIS — R0602 Shortness of breath: Secondary | ICD-10-CM

## 2020-10-03 DIAGNOSIS — E785 Hyperlipidemia, unspecified: Secondary | ICD-10-CM

## 2020-10-03 LAB — RENAL FUNCTION PANEL
Albumin: 3 g/dL — ABNORMAL LOW (ref 3.5–5.0)
Anion gap: 24 — ABNORMAL HIGH (ref 5–15)
BUN: 112 mg/dL — ABNORMAL HIGH (ref 8–23)
CO2: 16 mmol/L — ABNORMAL LOW (ref 22–32)
Calcium: 8.1 mg/dL — ABNORMAL LOW (ref 8.9–10.3)
Chloride: 96 mmol/L — ABNORMAL LOW (ref 98–111)
Creatinine, Ser: 20.97 mg/dL — ABNORMAL HIGH (ref 0.61–1.24)
GFR, Estimated: 2 mL/min — ABNORMAL LOW (ref >60)
Glucose, Bld: 69 mg/dL — ABNORMAL LOW (ref 70–99)
Phosphorus: >30 mg/dL — ABNORMAL HIGH (ref 2.5–4.6)
Potassium: 5 mmol/L (ref 3.5–5.1)
Sodium: 136 mmol/L (ref 135–145)

## 2020-10-03 LAB — CBC WITH DIFFERENTIAL/PLATELET
Abs Immature Granulocytes: 0.03 10*3/uL (ref 0.00–0.07)
Basophils Absolute: 0.1 10*3/uL (ref 0.0–0.1)
Basophils Relative: 1 %
Eosinophils Absolute: 0.7 10*3/uL — ABNORMAL HIGH (ref 0.0–0.5)
Eosinophils Relative: 9 %
HCT: 28 % — ABNORMAL LOW (ref 39.0–52.0)
Hemoglobin: 10 g/dL — ABNORMAL LOW (ref 13.0–17.0)
Immature Granulocytes: 0 %
Lymphocytes Relative: 17 %
Lymphs Abs: 1.3 10*3/uL (ref 0.7–4.0)
MCH: 32.3 pg (ref 26.0–34.0)
MCHC: 35.7 g/dL (ref 30.0–36.0)
MCV: 90.3 fL (ref 80.0–100.0)
Monocytes Absolute: 1.1 10*3/uL — ABNORMAL HIGH (ref 0.1–1.0)
Monocytes Relative: 13 %
Neutro Abs: 4.9 10*3/uL (ref 1.7–7.7)
Neutrophils Relative %: 60 %
Platelets: 271 10*3/uL (ref 150–400)
RBC: 3.1 MIL/uL — ABNORMAL LOW (ref 4.22–5.81)
RDW: 16.9 % — ABNORMAL HIGH (ref 11.5–15.5)
WBC: 8.1 10*3/uL (ref 4.0–10.5)
nRBC: 0 % (ref 0.0–0.2)

## 2020-10-03 LAB — CBC
HCT: 32.9 % — ABNORMAL LOW (ref 39.0–52.0)
Hemoglobin: 11.5 g/dL — ABNORMAL LOW (ref 13.0–17.0)
MCH: 31.7 pg (ref 26.0–34.0)
MCHC: 35 g/dL (ref 30.0–36.0)
MCV: 90.6 fL (ref 80.0–100.0)
Platelets: 296 10*3/uL (ref 150–400)
RBC: 3.63 MIL/uL — ABNORMAL LOW (ref 4.22–5.81)
RDW: 16.6 % — ABNORMAL HIGH (ref 11.5–15.5)
WBC: 9.9 10*3/uL (ref 4.0–10.5)
nRBC: 0 % (ref 0.0–0.2)

## 2020-10-03 LAB — TROPONIN I (HIGH SENSITIVITY)
Troponin I (High Sensitivity): 3154 ng/L (ref <18)
Troponin I (High Sensitivity): 4408 ng/L (ref <18)

## 2020-10-03 LAB — HEPARIN LEVEL (UNFRACTIONATED)
Heparin Unfractionated: 0.39 IU/mL (ref 0.30–0.70)
Heparin Unfractionated: 0.18 IU/mL — ABNORMAL LOW (ref 0.30–0.70)

## 2020-10-03 MED ORDER — HEPARIN BOLUS VIA INFUSION
2500.0000 [IU] | Freq: Once | INTRAVENOUS | Status: DC
  Filled 2020-10-03: qty 2500

## 2020-10-03 MED ORDER — NITROGLYCERIN IN D5W 200-5 MCG/ML-% IV SOLN: 0.0000 ug/min | INTRAVENOUS | Status: DC

## 2020-10-03 MED ORDER — CALCITRIOL 0.5 MCG PO CAPS
2.2500 ug | ORAL_CAPSULE | ORAL | Status: DC
  Administered 2020-10-04: 2.25 ug via ORAL
  Filled 2020-10-03: qty 1

## 2020-10-03 MED ORDER — DARBEPOETIN ALFA 40 MCG/0.4ML IJ SOSY
40.0000 ug | PREFILLED_SYRINGE | INTRAMUSCULAR | Status: DC
  Filled 2020-10-03: qty 0.4

## 2020-10-03 MED ORDER — ASPIRIN EC 81 MG PO TBEC
81.0000 mg | DELAYED_RELEASE_TABLET | Freq: Every day | ORAL | Status: DC
  Administered 2020-10-03 – 2020-10-04 (×2): 81 mg via ORAL
  Filled 2020-10-03 (×2): qty 1

## 2020-10-03 MED ORDER — CINACALCET HCL 30 MG PO TABS
30.0000 mg | ORAL_TABLET | ORAL | Status: DC
  Administered 2020-10-04: 30 mg via ORAL
  Filled 2020-10-03: qty 1

## 2020-10-03 MED ORDER — HEPARIN SODIUM (PORCINE) 1000 UNIT/ML IJ SOLN
INTRAMUSCULAR | Status: AC
  Administered 2020-10-03: 1000 [IU]
  Filled 2020-10-03: qty 3

## 2020-10-03 NOTE — ED Notes (Signed)
Dr. Marlowe Sax returned page; says nitro and heparin and satisfactory; says Cardiology has already seen the patient and says he is not a candidate for cath or surgery.  Says if further significant changes occur, to page her again.

## 2020-10-03 NOTE — Progress Notes (Signed)
Cardiology Rounding Note:   I have reviewed his chart and admission records. Please see the full cardiology consult from last night.   Kenneth [name] is at end stage of his heart disease. He is followed in our practice by Dr. [name]. I have reviewed his cath films this am and discussed his presentation with Dr. [name]. He is not felt to be a candidate for bypass surgery or PCI.   We have nothing more to offer at this point. I would suggest palliative care consultation. Restart his home medical therapy. Ok to continue IV heparin for now.   We will not plan any further cardiac testing.   Lauree Chandler 10/03/2020 9:55 AM

## 2020-10-03 NOTE — ED Notes (Signed)
Date and time results received: 10/03/20  (use smartphrase ".now" to insert current time)  Test: trop Critical Value: 4,408  Name of Provider Notified: Dr. Ree Kida  Orders Received? Or Actions Taken?: No orders received at this time

## 2020-10-03 NOTE — Progress Notes (Signed)
ANTICOAGULATION CONSULT NOTE   Pharmacy Consult for Heparin Indication: chest pain/ACS  No Known Allergies  Patient Measurements: Height: 5\' 10"  (177.8 cm) Weight: 88.9 kg (195 lb 15.8 oz) IBW/kg (Calculated) : 73 Heparin Dosing Weight: 93.9 kg   Vital Signs: Temp: 97.6 F (36.4 C) (12/08 1431) Temp Source: Oral (12/08 1431) BP: 157/93 (12/08 1634) Pulse Rate: 47 (12/08 1641)  Labs: Recent Labs    10/02/20 0102 10/02/20 0345 10/02/20 2149 10/03/20 0231 10/03/20 0549 10/03/20 0554 10/03/20 1517 10/03/20 1518 10/03/20 1608  HGB 9.9*  --   --   --   --   --  10.0*  --   --   HCT 29.4*  --   --   --   --   --  28.0*  --   --   PLT 242  --   --   --   --   --  271  --   --   HEPARINUNFRC  --   --   --   --  0.39  --   --   --  0.18*  CREATININE 19.46*  --   --   --   --   --   --  20.97*  --   TROPONINIHS 58*   < > 1,991* 3,154*  --  4,408*  --   --   --    < > = values in this interval not displayed.    Estimated Creatinine Clearance: 3.9 mL/min (A) (by C-G formula based on SCr of 20.97 mg/dL (H)).   Medical History: Past Medical History:  Diagnosis Date  . Acute CHF ([place]) 01/2018  . Cardiomyopathy secondary    likely related to HTN heart disease; possibly ETOH related as well  . Chronic combined systolic and diastolic heart failure (HCC)    Echocardiogram 09/22/11: Moderate LVH, EF 86-76%, grade 3 diastolic dysfunction, mild MR, moderate to severe LAE, mild RVE, mild to moderate TR, small to moderate pericardial effusion  . Coronary artery disease    not felt to be candidate for CABG 08/2018; medical thearpy recomended due to high risk of PCI   . Dysrhythmia    aflutter 04/2016, afib 02/2018, not felt to be a candidate for anticoagulation due to non-compliance and ETOH  . ESRD (end stage renal disease) on dialysis ([place])    due to hypertensive nephrosclerosis; TTS; [facility] (09/01/2018)  . History of alcohol abuse   . Hypertension   . Myocardial infarction [facility])     " mild " per daughter  . PEA (Pulseless electrical activity) ([place])    PEA arrest 03/09/18, treated empirically for hyperkalemia, shock x1 for WCT, given amiodarone, ROSC after 10 min of ACLS   Assessment: 10 YOM who presents with chest pain now with significant troponin spike to start IV heparin for ACS. H/H low. Plt wnl. Of note, patient has a h/o on ESRD on HD.   12/8 PM update: patient in HD. HL 0.18    Goal of Therapy:  Heparin level 0.3-0.7 units/ml Monitor platelets by anticoagulation protocol: Yes   Plan:  - Heparin 2500 units x 1 bolus  - Increase heparin to 1500 units/hr - 8 hour heparin level  Shaneal Barasch A. Levada Dy, PharmD, BCPS, FNKF Clinical Pharmacist [place] Please utilize [facility] for appropriate phone number to reach the unit pharmacist ([place])

## 2020-10-03 NOTE — ED Notes (Signed)
Patient complaining of 8/10 chest pain. Provider made aware at this time. EKG captured.

## 2020-10-03 NOTE — ED Notes (Signed)
Pt asking for beverage; sent message to doctor to ask if pt may drink.

## 2020-10-03 NOTE — ED Notes (Signed)
Pt reported new onset of CP at [place]; Dr. Marlowe Sax advised and EKG obtained per order.  ER doctor reviewed EKG and noted changes.  Nitro titrated up to 20.  Attempted to contact Dr. Marlowe Sax re: pain meds with no answer; have had Dr. Marlowe Sax paged to obtain further orders.

## 2020-10-03 NOTE — Progress Notes (Signed)
ANTICOAGULATION CONSULT NOTE   Pharmacy Consult for Heparin Indication: chest pain/ACS  No Known Allergies  Patient Measurements: Height: 5\' 10"  (177.8 cm) Weight: 100 kg (220 lb 7.4 oz) IBW/kg (Calculated) : 73 Heparin Dosing Weight: 93.9 kg   Vital Signs: Temp: 98.2 F (36.8 C) (12/08 0500) Temp Source: Oral (12/08 0500) BP: 164/79 (12/08 0600) Pulse Rate: 81 (12/08 0600)  Labs: Recent Labs    10/02/20 0102 10/02/20 0345 10/02/20 2001 10/02/20 2149 10/03/20 0231 10/03/20 0549  HGB 9.9*  --   --   --   --   --   HCT 29.4*  --   --   --   --   --   PLT 242  --   --   --   --   --   HEPARINUNFRC  --   --   --   --   --  0.39  CREATININE 19.46*  --   --   --   --   --   TROPONINIHS 58*   < > 1,721* 1,991* 3,154*  --    < > = values in this interval not displayed.    Estimated Creatinine Clearance: 4.4 mL/min (A) (by C-G formula based on SCr of 19.46 mg/dL (H)).   Medical History: Past Medical History:  Diagnosis Date  . Acute CHF ([place]) 01/2018  . Cardiomyopathy secondary    likely related to HTN heart disease; possibly ETOH related as well  . Chronic combined systolic and diastolic heart failure (HCC)    Echocardiogram 09/22/11: Moderate LVH, EF 16-10%, grade 3 diastolic dysfunction, mild MR, moderate to severe LAE, mild RVE, mild to moderate TR, small to moderate pericardial effusion  . Coronary artery disease    not felt to be candidate for CABG 08/2018; medical thearpy recomended due to high risk of PCI   . Dysrhythmia    aflutter 04/2016, afib 02/2018, not felt to be a candidate for anticoagulation due to non-compliance and ETOH  . ESRD (end stage renal disease) on dialysis ([place])    due to hypertensive nephrosclerosis; TTS; [facility] (09/01/2018)  . History of alcohol abuse   . Hypertension   . Myocardial infarction [facility])    " mild " per daughter  . PEA (Pulseless electrical activity) ([place])    PEA arrest 03/09/18, treated empirically for hyperkalemia, shock  x1 for WCT, given amiodarone, ROSC after 10 min of ACLS   Assessment: 54 YOM who presents with chest pain now with significant troponin spike to start IV heparin for ACS. H/H low. Plt wnl. Of note, patient has a h/o on ESRD on HD.   12/8 AM update:  Initial heparin level therapeutic   Goal of Therapy:  Heparin level 0.3-0.7 units/ml Monitor platelets by anticoagulation protocol: Yes   Plan:  -Cont heparin 1100 units/hr -1400 heparin level  Narda Bonds, PharmD, BCPS Clinical Pharmacist Phone: [phone]

## 2020-10-03 NOTE — Progress Notes (Signed)
PROGRESS NOTE    Kenneth [name]  HUT:[id number] DOB: Jul 25, 1956 DOA: 10/02/2020 PCP: [name], [name], MD   Brief Narrative:  HPI on 10/02/2020 by Dr. Samul Dada is a 64 y.o. male with medical history significant of complex severe multivessel CAD per [facility] 04/2020 felt not to be a candidate for PCI or CABG and being managed medically by cardiology, A. fib/flutter not a candidate for anticoagulation due to noncompliance, PEA arrest in May 2019, chronic combined systolic and diastolic CHF (EF 65-68% and G1DD on echo done 04/2020), ESRD on HD TTS, hypertension, hyperlipidemia, previous heavy alcohol use, previous tobacco use presented to the ED earlier today via EMS with complaints of an isolated episode of chest pain associated with eating which resolved prior to ED arrival. Took aspirin 324 mg at home. Work-up was not concerning for ACS at that time -EKG with no acute changes and troponins mildly elevated but flat.  He was discharged from the ED with plan to go to [facility] for routine dialysis.  Patient returned to the ED just a few hours later via EMS with complaints of chest pain. Patient states that after he was discharged from the ED he went home and ate ice. Soon after he started experiencing left-sided throbbing chest pain associated with nausea and vomiting which made him return to the ED. States his chest pain did get better after he received sublingual nitroglycerin but has now started again and is 5 out of 10 in intensity. Denies abdominal pain. He is no longer nauseous or vomiting. States his last dialysis session was on Thursday last week. He missed dialysis on Saturday and today. Patient his blood pressure is always high and he is not sure which medications as his daughter manages them.  Assessment & Plan   NSTEMI -Patient presented with elevated troponin at 58 however now up to 4408 -History of complex severe multivessel CAD per cath in July 2021 and felt  not to be a good candidate for PCI or CABG and has been medically managed by cardiology -Patient with an isolated episode of chest pain which occurred prior to admission -EKG showed T wave inversions in inferior lateral leads similar to prior tracings, and new ST depressions in lateral leads -Cardiology consulted and appreciated-recommended starting home medications and considering palliative care consultation for long-term chronic management -Continue heparin and sublingual nitroglycerin as needed for chest pain -Palliative care consulted and appreciated  Hypertensive emergency -In the setting of missed dialysis (patient tells me his last dialysis session was on 09/29/2020) -Patient could not tolerate the antihypertensive medications that he is on -Placed on nitroglycerin drip -Suspect will improve with dialysis  End-stage renal disease -Patient dialyzes on Tuesday, Thursday, Saturday -Nephrology consulted and appreciated   Chronic combined systolic and diastolic heart failure -Currently appears to be euvolemic and compensated -Echocardiogram July 2021 shows an EF of 55 to 12%, grade 1 diastolic dysfunction -Monitor intake output, weights  History of atrial fibrillation/flutter -Followed by cardiology -Patient does not a candidate for anticoagulation due to noncompliant -Currently in sinus rhythm  Hyperlipidemia -Pending med history completion  DVT Prophylaxis Heparin  Code Status: Full  Family Communication: None at bedside  Disposition Plan:  Status is: Inpatient  Remains inpatient appropriate because:IV treatments appropriate due to intensity of illness or inability to take PO and Inpatient level of care appropriate due to severity of illness   Dispo: The patient is from: Home  Anticipated d/c is to: Home              Anticipated d/c date is: 3 days              Patient currently is not medically stable to  d/c.  Consultants Cardiology Nephrology Palliative care  Procedures  None  Antibiotics   Anti-infectives (From admission, onward)   None      Subjective:   Kenneth [name] seen and examined today.  Denies further chest pain.  Denies current shortness of breath, abdominal pain, nausea or vomiting, diarrhea or constipation, dizziness or headache.  Does state that he feels generally weak.  Admits to missing dialysis on Tuesday.  Objective:   Vitals:   10/03/20 0415 10/03/20 0500 10/03/20 0600 10/03/20 0700  BP: (!) 186/98 (!) 147/71 (!) 164/79 (!) 162/88  Pulse: 78 100 81 80  Resp: 15 (!) 23 19 11   Temp:  98.2 F (36.8 C)    TempSrc:  Oral    SpO2: 96% 99% 99% 96%  Weight:      Height:        Intake/Output Summary (Last 24 hours) at 10/03/2020 0943 Last data filed at 10/03/2020 0331 Gross per 24 hour  Intake 240 ml  Output --  Net 240 ml   Filed Weights   10/02/20 2129  Weight: 100 kg    Exam  General: Well developed, well nourished, NAD, appears stated age  HEENT: NCAT, mucous membranes moist.   Cardiovascular: S1 S2 auscultated, RRR  Respiratory: Clear to auscultation bilaterally , no wheezing noted  Abdomen: Soft, nontender, nondistended, + bowel sounds  Extremities: warm dry without cyanosis clubbing.  LUE AV fistula  Neuro: AAOx3, nonfocal  Psych: appropriate mood and affect   Data Reviewed: I have personally reviewed following labs and imaging studies  CBC: Recent Labs  Lab 09/29/20 1229 09/29/20 2111 10/02/20 0102  WBC 8.0 7.2 8.4  HGB 11.3* 11.7* 9.9*  HCT 33.8* 34.7* 29.4*  MCV 94.7 94.6 93.6  PLT 225 240 161   Basic Metabolic Panel: Recent Labs  Lab 09/29/20 1229 09/29/20 2111 10/02/20 0102  NA 139 139 140  K 4.3 4.6 4.7  CL 96* 95* 97*  CO2 23 23 20*  GLUCOSE 105* 87 86  BUN 67* 67* 95*  CREATININE 14.39* 14.67* 19.46*  CALCIUM 9.2 9.4 8.6*   GFR: Estimated Creatinine Clearance: 4.4 mL/min (A) (by C-G formula based on  SCr of 19.46 mg/dL (H)). Liver Function Tests: No results for input(s): AST, ALT, ALKPHOS, BILITOT, PROT, ALBUMIN in the last 168 hours. No results for input(s): LIPASE, AMYLASE in the last 168 hours. No results for input(s): AMMONIA in the last 168 hours. Coagulation Profile: Recent Labs  Lab 09/29/20 2111  INR 1.0   Cardiac Enzymes: No results for input(s): CKTOTAL, CKMB, CKMBINDEX, TROPONINI in the last 168 hours. BNP (last 3 results) No results for input(s): PROBNP in the last 8760 hours. HbA1C: No results for input(s): HGBA1C in the last 72 hours. CBG: No results for input(s): GLUCAP in the last 168 hours. Lipid Profile: No results for input(s): CHOL, HDL, LDLCALC, TRIG, CHOLHDL, LDLDIRECT in the last 72 hours. Thyroid Function Tests: No results for input(s): TSH, T4TOTAL, FREET4, T3FREE, THYROIDAB in the last 72 hours. Anemia Panel: No results for input(s): VITAMINB12, FOLATE, FERRITIN, TIBC, IRON, RETICCTPCT in the last 72 hours. Urine analysis:    Component Value Date/Time   COLORURINE YELLOW 05/12/2016 0834   APPEARANCEUR CLOUDY (A) 05/12/2016 0960  LABSPEC 1.014 05/12/2016 0834   PHURINE 5.0 05/12/2016 0834   GLUCOSEU NEGATIVE 05/12/2016 0834   HGBUR MODERATE (A) 05/12/2016 0834   BILIRUBINUR NEGATIVE 05/12/2016 0834   KETONESUR NEGATIVE 05/12/2016 0834   PROTEINUR 100 (A) 05/12/2016 0834   UROBILINOGEN 0.2 12/09/2013 0013   NITRITE NEGATIVE 05/12/2016 0834   LEUKOCYTESUR NEGATIVE 05/12/2016 0834   Sepsis Labs: @LABRCNTIP (procalcitonin:4,lacticidven:4)  ) Recent Results (from the past 240 hour(s))  Resp Panel by RT-PCR (Flu A&B, Covid) Nasopharyngeal Swab     Status: None   Collection Time: 09/24/20 11:34 AM   Specimen: Nasopharyngeal Swab; Nasopharyngeal(NP) swabs in vial transport medium  Result Value Ref Range Status   SARS Coronavirus 2 by RT PCR NEGATIVE NEGATIVE Final    Comment: (NOTE) SARS-CoV-2 target nucleic acids are NOT DETECTED.  The  SARS-CoV-2 RNA is generally detectable in upper respiratory specimens during the acute phase of infection. The lowest concentration of SARS-CoV-2 viral copies this assay can detect is 138 copies/mL. A negative result does not preclude SARS-Cov-2 infection and should not be used as the sole basis for treatment or other patient management decisions. A negative result may occur with  improper specimen collection/handling, submission of specimen other than nasopharyngeal swab, presence of viral mutation(s) within the areas targeted by this assay, and inadequate number of viral copies(<138 copies/mL). A negative result must be combined with clinical observations, patient history, and epidemiological information. The expected result is Negative.  Fact Sheet for Patients:  EntrepreneurPulse.com.au  Fact Sheet for Healthcare Providers:  IncredibleEmployment.be  This test is no t yet approved or cleared by the [place] FDA and  has been authorized for detection and/or diagnosis of SARS-CoV-2 by FDA under an Emergency Use Authorization (EUA). This EUA will remain  in effect (meaning this test can be used) for the duration of the COVID-19 declaration under Section 564(b)(1) of the Act, 21 U.S.C.section 360bbb-3(b)(1), unless the authorization is terminated  or revoked sooner.       Influenza A by PCR NEGATIVE NEGATIVE Final   Influenza B by PCR NEGATIVE NEGATIVE Final    Comment: (NOTE) The Xpert Xpress SARS-CoV-2/FLU/RSV plus assay is intended as an aid in the diagnosis of influenza from Nasopharyngeal swab specimens and should not be used as a sole basis for treatment. Nasal washings and aspirates are unacceptable for Xpert Xpress SARS-CoV-2/FLU/RSV testing.  Fact Sheet for Patients: EntrepreneurPulse.com.au  Fact Sheet for Healthcare Providers: IncredibleEmployment.be  This test is not yet approved or  cleared by the [place] FDA and has been authorized for detection and/or diagnosis of SARS-CoV-2 by FDA under an Emergency Use Authorization (EUA). This EUA will remain in effect (meaning this test can be used) for the duration of the COVID-19 declaration under Section 564(b)(1) of the Act, 21 U.S.C. section 360bbb-3(b)(1), unless the authorization is terminated or revoked.  Performed at [facility], [place] [address], [place], [place] [address]   Resp Panel by RT-PCR (Flu A&B, Covid) Nasopharyngeal Swab     Status: None   Collection Time: 10/02/20  9:28 PM   Specimen: Nasopharyngeal Swab; Nasopharyngeal(NP) swabs in vial transport medium  Result Value Ref Range Status   SARS Coronavirus 2 by RT PCR NEGATIVE NEGATIVE Final    Comment: (NOTE) SARS-CoV-2 target nucleic acids are NOT DETECTED.  The SARS-CoV-2 RNA is generally detectable in upper respiratory specimens during the acute phase of infection. The lowest concentration of SARS-CoV-2 viral copies this assay can detect is 138 copies/mL. A negative result does not  preclude SARS-Cov-2 infection and should not be used as the sole basis for treatment or other patient management decisions. A negative result may occur with  improper specimen collection/handling, submission of specimen other than nasopharyngeal swab, presence of viral mutation(s) within the areas targeted by this assay, and inadequate number of viral copies(<138 copies/mL). A negative result must be combined with clinical observations, patient history, and epidemiological information. The expected result is Negative.  Fact Sheet for Patients:  EntrepreneurPulse.com.au  Fact Sheet for Healthcare Providers:  IncredibleEmployment.be  This test is no t yet approved or cleared by the [place] FDA and  has been authorized for detection and/or diagnosis of SARS-CoV-2 by FDA under an Emergency Use Authorization (EUA). This  EUA will remain  in effect (meaning this test can be used) for the duration of the COVID-19 declaration under Section 564(b)(1) of the Act, 21 U.S.C.section 360bbb-3(b)(1), unless the authorization is terminated  or revoked sooner.       Influenza A by PCR NEGATIVE NEGATIVE Final   Influenza B by PCR NEGATIVE NEGATIVE Final    Comment: (NOTE) The Xpert Xpress SARS-CoV-2/FLU/RSV plus assay is intended as an aid in the diagnosis of influenza from Nasopharyngeal swab specimens and should not be used as a sole basis for treatment. Nasal washings and aspirates are unacceptable for Xpert Xpress SARS-CoV-2/FLU/RSV testing.  Fact Sheet for Patients: EntrepreneurPulse.com.au  Fact Sheet for Healthcare Providers: IncredibleEmployment.be  This test is not yet approved or cleared by the [place] FDA and has been authorized for detection and/or diagnosis of SARS-CoV-2 by FDA under an Emergency Use Authorization (EUA). This EUA will remain in effect (meaning this test can be used) for the duration of the COVID-19 declaration under Section 564(b)(1) of the Act, 21 U.S.C. section 360bbb-3(b)(1), unless the authorization is terminated or revoked.  Performed at [facility], [place] [address], [place],  [address]       Radiology Studies: DG Chest 2 View  Result Date: 10/02/2020 CLINICAL DATA:  Chest pain EXAM: CHEST - 2 VIEW COMPARISON:  September 29, 2020 FINDINGS: The heart size and mediastinal contours are within normal limits. Aortic knob calcifications are seen. Both lungs are clear. The visualized skeletal structures are unremarkable. IMPRESSION: No active cardiopulmonary disease. Electronically Signed   By: Prudencio Pair M.D.   On: 10/02/2020 01:17     Scheduled Meds: . aspirin EC  81 mg Oral Daily   Continuous Infusions: . heparin 1,200 Units/hr (10/03/20 0942)  . nitroGLYCERIN 20 mcg/min (10/03/20 0416)     LOS: 1 day   Time  Spent in minutes   45 minutes  Anahid Eskelson D.O. on 10/03/2020 at 9:43 AM  Between 7am to 7pm - Please see pager noted on [email or web address]  After 7pm go to [email or web address]  And look for the night coverage person covering for me after hours  [facility] Office  [phone]

## 2020-10-03 NOTE — ED Notes (Signed)
Pt given beverage per order.

## 2020-10-03 NOTE — Consult Note (Signed)
Consultation Note Date: 10/03/2020   Patient Name: Kenneth [name]  DOB: 09/25/1956  MRN: [id number]  Age / Sex: 64 y.o., male  PCP: [name], [name], MD Referring Physician: Cristal Ford, DO  Reason for Consultation: Establishing goals of care  HPI/Patient Profile: 64 y.o. male  with past medical history of MI, HTN, ESRD on hemodialysis, CHF, CAD presented to the ED on 10/02/20 with complains of chest pain. He has been noncompliant with his HD treatments many times. He is being admitted for NSTEMI, hypertensive emergency, ESRD, and CHF. Cardiology was consulted and stated he is at end stages of CHF and is not a candidate for bypass or PCI; they have nothing more to offer except medical management. Nephrology has been consulted for x2 missed HD treatments.   Of note, he has had 3 inpatient admissions and 4 ED admissions in the last 6 months.  Patient faces treatment option decisions, advanced directive decisions, and anticipatory care needs.  Clinical Assessment and Goals of Care: I have reviewed medical records including EPIC notes, labs, and imaging. Received report from primary RN - no acute concerns.  Went to visit patient at bedside - no family/visitors present. Patient was lying in bed awake, alert, oriented, and able to participate in conversation. No signs or non-verbal gestures of pain or discomfort noted. No respiratory distress, increased work of breathing, or secretions noted. Patient denies pain but endorses shortness of breath - he is scheduled to receive a dialysis treatment today.   Met with patient  to discuss diagnosis, prognosis, GOC, EOL wishes, disposition, and options.  I introduced Palliative Medicine as specialized medical care for people living with serious illness. It focuses on providing relief from the symptoms and stress of a serious illness. The goal is to improve quality of life  for both the patient and the family.  We discussed a brief life review of the patient as well as functional and nutritional status. 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 Unfortunately, he tells me his wife has passed away - they had two children together; one daughter and one son. The patient currently lives in a private residence with both of his children. Prior to hospitalization, the patient reports functional independence and ability to perform all ADLs. He had a good appetite and his intake was adequate. Kenneth [name] reports being on dialysis for 2 years and only recently learned of his heart failure.   We discussed patient's current illness and what it means in the larger context of patient's on-going co-morbidities. Kenneth [name] seems to have a clear understanding of his current medical conditions. He understands that ESRD and CHF are progressive, non-curable diseases underlying the patient's current acute medical conditions. Natural disease trajectory and expectations at EOL were discussed. I attempted to elicit values and goals of care important to the patient. The difference between aggressive medical intervention and comfort care was considered in light of the patient's goals of care. Kenneth [name] asked, "what happens when the medications stop working." A long discussion was had  around EOL and what comfort care meant - answered all questions. At this time, the patient tells me that he wants to continue medical treatment for his CHF and dialysis treatments. We had another long discussion around his dialysis treatments and why he has not been attending treatments as scheduled. Kenneth [name] tells me that he has no issues with his outpatient clinic, stating "the facility is excellent." We discussed his transportation. He utilizes public transportation and states that often, especially every Saturday, the [employer] arrives at his home and the driver "doesn't blow the horn or come to the door" to notify him they have  arrived. He states he doesn't wait outside because the vans come at different times, he could be waiting for long periods of time, and it's cold outside. He has called the public transportation services in the past inquiring about where his ride is and they tell him the [facility] Lei had already been to his house and left, considering him a no show. He feels this is his biggest barrier to getting to treatments. The patient tells me that he does not have any issues getting ready on time - he is ready on time and "is waiting around" for his ride. He desires to continue treatments. We discussed the importance of each dialysis treatment and risks that arise for each time he misses a treatment - he tells me he knows they are important if he wants to live.  Palliative Care services outpatient were explained and offered - patient was agreeable.  Advance directives, concepts specific to code status, artificial feeding and hydration, and rehospitalization were considered and discussed. Kenneth [name] understands that he is at high risk of rehospitalization, especially if he continues to miss dialysis treatments. The patient does not have a Living Will but tells me he would like his daughter to be his healthcare proxy and would like to complete HCPOA while in house. Introduced, reviewed, and updated/completed MOST form as outlined in Recommendation section below.   Visit also consisted of discussions dealing with the complex and emotionally intense issues of symptom management and palliative care in the setting of serious and potentially life-threatening illness. Palliative care team will continue to support patient, patient's family, and medical team.  Discussed with patient the importance of continued conversation with family and the medical providers regarding overall plan of care and treatment options, ensuring decisions are within the context of the patient's values and GOCs.    Questions and concerns were addressed. The  patient was encouraged to call with questions and/or concerns. PMT card was provided.   Primary Decision Maker: PATIENT    SUMMARY OF RECOMMENDATIONS:  Continue current medical treatment  Initiated partial code status - no intubation  Patient desires to continue dialysis treatments - he states his biggest barrier getting to treatments is the public service transportation driver not notifying him of their arrival and leaving his house without him  TOC and dialysis liaison notified of patient's transportation concern  TOC notified and consult placed for: outpatient Palliative Care referral  Chaplain consult placed: patient would like to name his daughter/Ericka Dom his HCPOA  MOST form completed as follows: Attempt resuscitation - Do Not Intubate, Limited additional interventions, Determine use or limitation of antibiotics when infection occurs, No IV fluids, No feeding tube. Original left at patient's bedside in ED; copy made and was scanned into Vyn[employer].  PMT will continue to follow peripherally. If there are any imminent needs please call the service directly  Code Status/Advance Care Planning:  Limited code - no intubation  Palliative Prophylaxis:   Aspiration, Bowel Regimen, Delirium Protocol, Frequent Pain Assessment and Turn Reposition  Additional Recommendations (Limitations, Scope, Preferences):  Full Scope Treatment  Psycho-social/Spiritual:   Desire for further Chaplaincy support:yes Created space and opportunity for patient to express thoughts and feelings regarding patient's current medical situation.   Emotional support and therapeutic listening provided.  Prognosis:   Unable to determine  Discharge Planning: Home with Palliative Services      Primary Diagnoses: Present on Admission: . NSTEMI (non-ST elevated myocardial infarction) ([place]) . Dyslipidemia . ESRD (end stage renal disease) ([place]) . Hypertensive emergency . Atrial fibrillation and  flutter ([place])   I have reviewed the medical record, interviewed the patient and family, and examined the patient. The following aspects are pertinent.  Past Medical History:  Diagnosis Date  . Acute CHF ([place]) 01/2018  . Cardiomyopathy secondary    likely related to HTN heart disease; possibly ETOH related as well  . Chronic combined systolic and diastolic heart failure (HCC)    Echocardiogram 09/22/11: Moderate LVH, EF 25-85%, grade 3 diastolic dysfunction, mild MR, moderate to severe LAE, mild RVE, mild to moderate TR, small to moderate pericardial effusion  . Coronary artery disease    not felt to be candidate for CABG 08/2018; medical thearpy recomended due to high risk of PCI   . Dysrhythmia    aflutter 04/2016, afib 02/2018, not felt to be a candidate for anticoagulation due to non-compliance and ETOH  . ESRD (end stage renal disease) on dialysis ([place])    due to hypertensive nephrosclerosis; TTS; [address] (09/01/2018)  . History of alcohol abuse   . Hypertension   . Myocardial infarction [facility])    " mild " per daughter  . PEA (Pulseless electrical activity) ([place])    PEA arrest 03/09/18, treated empirically for hyperkalemia, shock x1 for WCT, given amiodarone, ROSC after 10 min of ACLS   Social History   Socioeconomic History  . Marital status: Divorced    Spouse name: Not on file  . Number of children: Not on file  . Years of education: Not on file  . Highest education level: Not on file  Occupational History  . Occupation: retired  Tobacco Use  . Smoking status: Former Smoker    Packs/day: 2.00    Years: 48.00    Pack years: 96.00    Types: Cigars    Quit date: 10/27/2017    Years since quitting: 2.9  . Smokeless tobacco: Former Systems developer    Types: Secondary school teacher  . Vaping Use: Never used  Substance and Sexual Activity  . Alcohol use: Not Currently    Comment: h/o very heavy use, quit about 2019  . Drug use: No  . Sexual activity: Not Currently  Other Topics Concern   . Not on file  Social History Narrative  . Not on file   Social Determinants of Health   Financial Resource Strain:   . Difficulty of Paying Living Expenses: Not on file  Food Insecurity:   . Worried About Charity fundraiser in the Last Year: Not on file  . Ran Out of Food in the Last Year: Not on file  Transportation Needs:   . Lack of Transportation (Medical): Not on file  . Lack of Transportation (Non-Medical): Not on file  Physical Activity:   . Days of Exercise per Week: Not on file  . Minutes of Exercise per Session: Not on file  Stress:   .  Feeling of Stress : Not on file  Social Connections:   . Frequency of Communication with Friends and Family: Not on file  . Frequency of Social Gatherings with Friends and Family: Not on file  . Attends Religious Services: Not on file  . Active Member of Clubs or Organizations: Not on file  . Attends Archivist Meetings: Not on file  . Marital Status: Not on file   Family History  Problem Relation Age of Onset  . Emphysema Mother   . Cirrhosis Father    Scheduled Meds: . aspirin EC  81 mg Oral Daily   Continuous Infusions: . heparin 1,200 Units/hr (10/03/20 0942)  . nitroGLYCERIN 20 mcg/min (10/03/20 0416)   PRN Meds:.acetaminophen, ondansetron (ZOFRAN) IV Medications Prior to Admission:  Prior to Admission medications   Medication Sig Start Date End Date Taking? Authorizing Provider  amLODipine (NORVASC) 10 MG tablet Take 1 tablet (10 mg total) by mouth daily. 09/24/20   Dwyane Dee, MD  aspirin EC 81 MG EC tablet Take 1 tablet (81 mg total) by mouth daily. Swallow whole. 05/16/20   Alma Friendly, MD  atorvastatin (LIPITOR) 80 MG tablet Take 1 tablet (80 mg total) by mouth daily. 03/16/20   [name], [name], MD  clopidogrel (PLAVIX) 75 MG tablet Take 1 tablet by mouth once daily Patient taking differently: Take 75 mg by mouth daily.  05/17/20   Almyra Deforest, PA  diphenhydramine-acetaminophen (TYLENOL PM EXTRA  STRENGTH) 25-500 MG TABS tablet Take 2 tablets by mouth at bedtime as needed (Pain, Sleep).  05/12/18   [provider]  isosorbide mononitrate (IMDUR) 30 MG 24 hr tablet Take 3 tablets (90 mg total) by mouth daily. 09/24/20   Dwyane Dee, MD  metoprolol succinate (TOPROL-XL) 25 MG 24 hr tablet Take 1 tablet (25 mg total) by mouth 4 (four) times a week. Take on Sunday, Monday, Wednesday, Friday 06/26/20   [name], [name], MD  nitroGLYCERIN (NITROSTAT) 0.4 MG SL tablet DISSOLVE ONE TABLET UNDER THE TONGUE EVERY 5 MINUTES AS NEEDED FOR CHEST PAIN.  DO NOT EXCEED A TOTAL OF 3 DOSES IN 15 MINUTES Patient taking differently: Place 0.4 mg under the tongue every 5 (five) minutes as needed for chest pain.  09/04/20   [name], [name], MD   No Known Allergies Review of Systems  Constitutional: Negative for activity change and appetite change.  Respiratory: Positive for shortness of breath.   Gastrointestinal: Negative for nausea.  Neurological: Negative for dizziness and syncope.  All other systems reviewed and are negative.   Physical Exam Vitals and nursing note reviewed.  Constitutional:      General: He is not in acute distress. Pulmonary:     Effort: No respiratory distress.  Skin:    General: Skin is warm and dry.  Neurological:     Mental Status: He is alert and oriented to person, place, and time.  Psychiatric:        Attention and Perception: Attention normal.        Behavior: Behavior is cooperative.        Cognition and Memory: Cognition and memory normal.     Vital Signs: BP (!) 178/71   Pulse 74   Temp 98.2 F (36.8 C) (Oral)   Resp 17   Ht _0  (1.778 m)   Wt 100 kg   SpO2 96%   BMI 31.63 kg/m  Pain Scale: 0-10   Pain Score: 2    SpO2: SpO2: 96 % O2  Device:SpO2: 96 % O2 Flow Rate: .O2 Flow Rate (L/min): 2 L/min  IO: Intake/output summary:   Intake/Output Summary (Last 24 hours) at 10/03/2020 1042 Last data filed at 10/03/2020 0331 Gross per 24 hour   Intake 240 ml  Output --  Net 240 ml    LBM:   Baseline Weight: Weight: 100 kg Most recent weight: Weight: 100 kg     Palliative Assessment/Data: PPS 70%     Time In: 1100 Time Out: 1215 Time Total: 75 minutes  Greater than 50%  of this time was spent counseling and coordinating care related to the above assessment and plan.  Signed by: Lin Landsman, NP   Please contact Palliative Medicine Team phone at [phone] for questions and concerns.  For individual provider: See Shea Evans

## 2020-10-03 NOTE — ED Notes (Signed)
Paged Dr Marlowe Sax to [facility]

## 2020-10-03 NOTE — ED Notes (Signed)
Palliative Care at bedside.  

## 2020-10-03 NOTE — Progress Notes (Addendum)
Subjective: Discharge 11/30 with chest pain now readmitted with non-STEMI, missed hemodialysis x2, last HD 12/02, chest x-ray no acute disease K4.7 on 1207/21.  Seen in ER on IV nitroglycerin drip noted no chest pain at current time or shortness of breath.  For dialysis today  Objective Vital signs in last 24 hours: Vitals:   10/03/20 0600 10/03/20 0700 10/03/20 0915 10/03/20 1143  BP: (!) 164/79 (!) 162/88 (!) 178/71 (!) 186/87  Pulse: 81 80 74 85  Resp: 19 11 17  (!) 22  Temp:      TempSrc:      SpO2: 99% 96% 96% 97%  Weight:      Height:       Weight change:   Physical Exam: General: Alert, WD, WN, but likely ill-appearing male NAD  Heart: RRR, no MRG Lungs: CTA, nonlabored breathing Abdomen: Bowel sounds normoactive, soft NTND no ascites Extremities: No pedal edema Dialysis Access: Positive bruit left upper arm AV fistula  Outpatient hemodialysis= [facility], TTS, EDW 85.0 kg, 4 hours, 2K, 2 calcium bath, heparin 2400 units, calcitriol 2.25 mcg p.o. q. dialysis, Sensipar 30 q. hemodialysis, no ESA, Access left upper arm AV fistula  Problem/Plan: 1.  ESRD -normal schedule TTS, [facility], has missed last 2 dialysis treatments plan for dialysis today 2. Non-STEMI with known severe complex multivessel CAD cardiology noted not felt to be a bypass surgery candidate for PCI recommend  medical therapy and would suggest palliative care consult 3. HTN/volume -no excess volume on chest x-ray or exam BP elevated  in ER improved somewhat after IV nitroglycerin drip continue home meds UF 2 L on hemodialysis. 4. Anemia -Hgb 9.9, will start Aranesp 40 q. weekly dialysis, noted none as outpatient currently follow-up iron studies 5. Secondary hyperparathyroidism -phosphorus 8.8, corrected calcium 9.6 continue binders with meals and vitamin D on hemodialysis and also Sensipar on dialysis 6. His history of atrial fib flutter= sinus on exam not a candidate for anticoag  Ernest Haber, PA-C [facility] Beeper [phone] 10/03/2020,12:46 PM  LOS: 1 day   Labs: Basic Metabolic Panel: Recent Labs  Lab 09/29/20 1229 09/29/20 2111 10/02/20 0102  NA 139 139 140  K 4.3 4.6 4.7  CL 96* 95* 97*  CO2 23 23 20*  GLUCOSE 105* 87 86  BUN 67* 67* 95*  CREATININE 14.39* 14.67* 19.46*  CALCIUM 9.2 9.4 8.6*   Liver Function Tests: No results for input(s): AST, ALT, ALKPHOS, BILITOT, PROT, ALBUMIN in the last 168 hours. No results for input(s): LIPASE, AMYLASE in the last 168 hours. No results for input(s): AMMONIA in the last 168 hours. CBC: Recent Labs  Lab 09/29/20 1229 09/29/20 2111 10/02/20 0102  WBC 8.0 7.2 8.4  HGB 11.3* 11.7* 9.9*  HCT 33.8* 34.7* 29.4*  MCV 94.7 94.6 93.6  PLT 225 240 242   Cardiac Enzymes: No results for input(s): CKTOTAL, CKMB, CKMBINDEX, TROPONINI in the last 168 hours. CBG: No results for input(s): GLUCAP in the last 168 hours.  Studies/Results: DG Chest 2 View  Result Date: 10/02/2020 CLINICAL DATA:  Chest pain EXAM: CHEST - 2 VIEW COMPARISON:  September 29, 2020 FINDINGS: The heart size and mediastinal contours are within normal limits. Aortic knob calcifications are seen. Both lungs are clear. The visualized skeletal structures are unremarkable. IMPRESSION: No active cardiopulmonary disease. Electronically Signed   By: Prudencio Pair M.D.   On: 10/02/2020 01:17   Medications: . heparin 1,200 Units/hr (10/03/20 0942)  . nitroGLYCERIN 20 mcg/min (10/03/20 0416)   .  aspirin EC  81 mg Oral Daily  . [START ON 10/04/2020] calcitRIOL  2.25 mcg Oral Q T,Th,Sa-HD  . [START ON 10/04/2020] cinacalcet  30 mg Oral Q T,Th,Sa-HD    Nephrology attending: Patient was seen and examined in ER.  Chart reviewed.  I agree with assessment and plan as outlined above. ESRD on HD missed 2 dialysis presented with non-STEMI.  Seen by cardiologist not a candidate for intervention therefore consulting palliative care.  Plan for dialysis today.  Resume home  medication. Katheran James, MD [facility].

## 2020-10-03 NOTE — ED Notes (Signed)
Assumed patient care at this time.

## 2020-10-04 ENCOUNTER — Encounter (HOSPITAL_COMMUNITY): Payer: Self-pay | Admitting: Internal Medicine

## 2020-10-04 DIAGNOSIS — E785 Hyperlipidemia, unspecified: Secondary | ICD-10-CM | POA: Diagnosis not present

## 2020-10-04 DIAGNOSIS — N186 End stage renal disease: Secondary | ICD-10-CM | POA: Diagnosis not present

## 2020-10-04 DIAGNOSIS — I4891 Unspecified atrial fibrillation: Secondary | ICD-10-CM | POA: Diagnosis not present

## 2020-10-04 DIAGNOSIS — I214 Non-ST elevation (NSTEMI) myocardial infarction: Secondary | ICD-10-CM | POA: Diagnosis not present

## 2020-10-04 LAB — CBC
HCT: 28.9 % — ABNORMAL LOW (ref 39.0–52.0)
Hemoglobin: 9.9 g/dL — ABNORMAL LOW (ref 13.0–17.0)
MCH: 31.4 pg (ref 26.0–34.0)
MCHC: 34.3 g/dL (ref 30.0–36.0)
MCV: 91.7 fL (ref 80.0–100.0)
Platelets: 248 10*3/uL (ref 150–400)
RBC: 3.15 MIL/uL — ABNORMAL LOW (ref 4.22–5.81)
RDW: 17.1 % — ABNORMAL HIGH (ref 11.5–15.5)
WBC: 6.8 10*3/uL (ref 4.0–10.5)
nRBC: 0 % (ref 0.0–0.2)

## 2020-10-04 LAB — RENAL FUNCTION PANEL
Albumin: 2.8 g/dL — ABNORMAL LOW (ref 3.5–5.0)
Anion gap: 18 — ABNORMAL HIGH (ref 5–15)
BUN: 57 mg/dL — ABNORMAL HIGH (ref 8–23)
CO2: 25 mmol/L (ref 22–32)
Calcium: 7.9 mg/dL — ABNORMAL LOW (ref 8.9–10.3)
Chloride: 94 mmol/L — ABNORMAL LOW (ref 98–111)
Creatinine, Ser: 13.31 mg/dL — ABNORMAL HIGH (ref 0.61–1.24)
GFR, Estimated: 4 mL/min — ABNORMAL LOW (ref >60)
Glucose, Bld: 82 mg/dL (ref 70–99)
Phosphorus: 7.2 mg/dL — ABNORMAL HIGH (ref 2.5–4.6)
Potassium: 4.1 mmol/L (ref 3.5–5.1)
Sodium: 137 mmol/L (ref 135–145)

## 2020-10-04 LAB — HEPARIN LEVEL (UNFRACTIONATED)
Heparin Unfractionated: 0.33 IU/mL (ref 0.30–0.70)
Heparin Unfractionated: <0.1 IU/mL — ABNORMAL LOW (ref 0.30–0.70)

## 2020-10-04 MED ORDER — NITROGLYCERIN 0.4 MG SL SUBL: 0.4000 mg | SUBLINGUAL_TABLET | SUBLINGUAL | 1 refills | Status: DC | PRN

## 2020-10-04 MED ORDER — CHLORHEXIDINE GLUCONATE CLOTH 2 % EX PADS: 6.0000 | MEDICATED_PAD | Freq: Every day | CUTANEOUS | Status: DC

## 2020-10-04 NOTE — TOC Initial Note (Signed)
Transition of Care [facility]) - Initial/Assessment Note    Patient Details  Name: Kenneth [name] MRN: [id number] Date of Birth: September 26, 1956  [facility] CM/SW Contact:    Bethena Roys, RN Phone Number: 10/04/2020, 1:01 PM  Clinical Narrative: Risk for readmission assessment completed. Prior to arrival patient was from home with the support of his daughter. Daughter feels that patient does not need home health services at this time. Per daughter, she assists with medications and the patient ambulates appropriately in the home. Patient takes SCAT to HD on TTS. Per daughter, the patient gets his medications from the [facility]. Case Manager discussed outpatient Palliative with the daughter and patient- agreeable to palliative services with [facility] . Referral made and the agency will call the patient. Collaborated with the renal navigator plan will be for patient to have HD on Friday and Saturday to get back on schedule. Case Manager will set up safe transportation vs. Lyft to get the patient transitioned home. No further needs at this time.         Expected Discharge Plan: Home/Self Care Barriers to Discharge: No Barriers Identified   Patient Goals and CMS Choice Patient states their goals for this hospitalization and ongoing recovery are:: to return home   Choice offered to / list presented to : NA  Expected Discharge Plan and Services Expected Discharge Plan: Home/Self Care In-house Referral: NA Discharge Planning Services: CM Consult Post Acute Care Choice: NA Living arrangements for the past 2 months: Apartment Expected Discharge Date: 10/04/20               DME Arranged: N/A DME Agency: NA       HH Arranged: NA          Prior Living Arrangements/Services Living arrangements for the past 2 months: Apartment Lives with:: [place] Patient language and need for interpreter reviewed:: Yes Do you feel safe going back  to the place where you live?: Yes      Need for Family Participation in Patient Care: Yes (Comment) Care giver support system in place?: Yes (comment)   Criminal Activity/Legal Involvement Pertinent to Current Situation/Hospitalization: No - Comment as needed  Activities of Daily Living Home Assistive Devices/Equipment: None ADL Screening (condition at time of admission) Patient's cognitive ability adequate to safely complete daily activities?: Yes Is the patient deaf or have difficulty hearing?: No Does the patient have difficulty seeing, even when wearing glasses/contacts?: No Does the patient have difficulty concentrating, remembering, or making decisions?: No Patient able to express need for assistance with ADLs?: Yes Does the patient have difficulty dressing or bathing?: No Independently performs ADLs?: Yes (appropriate for developmental age) Does the patient have difficulty walking or climbing stairs?: No Weakness of Legs: None Weakness of Arms/Hands: None  Permission Sought/Granted Permission sought to share information with : Family Nature conservation officer                Emotional Assessment Appearance:: Appears stated age Attitude/Demeanor/Rapport: Engaged Affect (typically observed): Appropriate Orientation: : Oriented to Situation,Oriented to  Time,Oriented to Place,Oriented to Self Alcohol / Substance Use: Not Applicable Psych Involvement: No (comment)  Admission diagnosis:  NSTEMI (non-ST elevated myocardial infarction) ([place]) [I21.4] Hypertensive urgency [I16.0] Patient Active Problem List   Diagnosis Date Noted  . Chest pain of uncertain etiology 78/58/8502  . Accelerated hypertension 09/24/2020  . Dyslipidemia 09/24/2020  . NSTEMI (non-ST elevated myocardial infarction) ([place]) 05/12/2020  . Non-ST elevated myocardial infarction ([place]) 05/12/2020  .  DNR (do not resuscitate) discussion   . Palliative care by specialist   . Goals of care, counseling/discussion    . Coronary artery disease involving native heart with angina pectoris ([place])   . STEMI (ST elevation myocardial infarction) ([place]) 09/17/2018  . Unstable angina ([place])   . ACS (acute coronary syndrome) ([place]) 09/01/2018  . ESRD (end stage renal disease) ([place]) 07/20/2018  . Acute respiratory failure ([place]) 05/11/2018  . Cardiac arrest ([place]) 03/09/2018  . Acute encephalopathy   . Acute on chronic respiratory failure ([place]) 02/16/2018  . Tobacco dependence 02/16/2018  . Pulmonary edema 01/26/2018  . Acute respiratory failure with hypoxia ([place]) 01/26/2018  . Atrial fibrillation and flutter ([place])   . Shortness of breath   . Acute on chronic diastolic CHF (congestive heart failure) ([place])   . Hypothermia 10/08/2016  . ESRD on hemodialysis ([place]) 10/08/2016  . Fall 10/08/2016  . Syncope 10/07/2016  . Near syncope 07/07/2016  . Cardiomyopathy ([place]) 07/07/2016  . Ventricular tachycardia, non-sustained ([place]) 07/06/2016  . Hypoglycemia 07/06/2016  . CKD (chronic kidney disease) stage 5, GFR less than 15 ml/min (HCC)   . Alcoholism ([place]) 05/13/2016  . Hypertensive emergency 12/08/2013  . Cardiomyopathy secondary 10/09/2011  . Alcohol withdrawal delirium ([place]) 09/25/2011  . Chronic diastolic CHF (congestive heart failure) ([place]) 09/24/2011   PCP:  [name], [name], MD Pharmacy:   [facility], [address] [place] [address] Phone: [phone] Fax: [facility] [place], [place] - [address] [place] [address] Phone: [phone] Fax: [phone]    Readmission Risk Interventions Readmission Risk Prevention Plan 10/04/2020  Transportation Screening Complete  Medication Review ([place]) Complete  PCP or Specialist appointment within 3-5 days of discharge Complete  HRI or [place] Complete  SW Recovery  Care/Counseling Consult Complete  Palliative Care Screening Complete  [place] Not Applicable  Some recent data might be hidden

## 2020-10-04 NOTE — Discharge Instructions (Signed)

## 2020-10-04 NOTE — Progress Notes (Signed)
ANTICOAGULATION CONSULT NOTE   Pharmacy Consult for Heparin Indication: chest pain/ACS  Assessment: 19 YOM who presents with chest pain now with significant troponin spike to start IV heparin for ACS. H/H low. Plt wnl. Of note, patient has a h/o on ESRD on HD.   Heparin level 0.33 units/ml.    Goal of Therapy:  Heparin level 0.3-0.7 units/ml Monitor platelets by anticoagulation protocol: Yes   Plan:  - Continue heparin at 1500 units/hr Daily HL and CBC Monitor for bleeding complications  Thanks for allowing pharmacy to be a part of this patient's care.  Excell Seltzer, PharmD Clinical Pharmacist

## 2020-10-04 NOTE — Consult Note (Signed)
   [facility] CM Inpatient Consult   10/04/2020  Kenneth [name] 1955-11-03 [id number]   Patient chart has been reviewed for readmissions less than 30 days and for high risk score for unplanned readmissions.  Patient assessed for community [facility] CM) follow up needs.   Spoke with patient's daughter and explained [facility] CM services and patient eligibility as member under [employer] plan. Our care coordination services provide chronic disease management services and possible assistance with community needs such as transportation and meals.   Patient's daughter verbally consents to receive post hospital follow up call from [facility] for assessment of community needs. Ambulatory [facility] CM referral placed.  Of note, [facility] Care Management services does not replace or interfere with any services that are arranged by inpatient case management or social work.  Netta Cedars, MSN, [facility] Liaison Nurse Mobile Phone [phone]  Toll free office [phone]

## 2020-10-04 NOTE — Care Management (Signed)
1505 10-04-20 [employer] scheduled for patient transport- daughter notified. Charge RN to escort patient down to entrance A. Graves-Bigelow, Ocie Cornfield, RN,BSN Case Manager

## 2020-10-04 NOTE — Progress Notes (Signed)
Chart reviewed.  No new cardiology recommendations.  He is not felt to be a candidate for further invasive cardiac testing.   Lauree Chandler 10/04/2020 9:09 AM

## 2020-10-04 NOTE — Progress Notes (Signed)
Patient's daughter called, Khasir Woodrome, stating that patient does not have transportation home.  Requesting that we call her when patient d/ced and requesting that patient get a cab voucher so he does not walk home.  Advised will make note of patient transportation needs.

## 2020-10-04 NOTE — Progress Notes (Signed)
Manufacturing engineer [facility])  Hospital Liaison RN note         Notified by [facility] manager of patient/family request for [facility] Palliative services at home after discharge.         [place] Palliative team will follow up with patient after discharge.         Please call with any hospice or palliative related questions.         Thank you for the opportunity to participate in this patient's care.     Chrislyn Edison Pace, BSN, RN [place] (listed on [place] under Hosp[facility])    [phone]

## 2020-10-04 NOTE — Progress Notes (Signed)
Renal Navigator notes patient's discharge order and summary. Patient has not yet had HD in the hospital yet today and will not be able to get treatment until 6:00pm or later tonight per HD unit. Nephrology agrees that patient's treatment today can be moved to tomorrow, as patient had HD treatment yesterday off schedule.  Navigator spoke with patient regarding above. He is agreeable to treatment at outpatient clinic tomorrow. Navigator rescheduled treatment for tomorrow, Friday, 10/05/20 at [facility] at 12:45pm. He needs to arrive at 12:25pm. Navigator also scheduled transportation for this appointment with [facility]. Patient has a pick up window of 10:55am-11:25am from home to [facility] HD clinic. He will be picked up from [facility] to return home between 5:00pm-5:30pm. RN agreed to inform patient's daughter when she updates her at patient's discharge from the hospital. Navigator updated patient. Patient will NOT have HD in the hospital today prior to discharge. Patient will resume regular HD schedule on Saturday.  Kenneth [name], [place] Renal Navigator [phone]

## 2020-10-04 NOTE — Progress Notes (Signed)
ANTICOAGULATION CONSULT NOTE   Pharmacy Consult for Heparin Indication: chest pain/ACS  No Known Allergies  Patient Measurements: Height: 5\' 10"  (177.8 cm) Weight: 87.8 kg (193 lb 9 oz) IBW/kg (Calculated) : 73 Heparin Dosing Weight: 93.9 kg   Vital Signs: Temp: 98.3 F (36.8 C) (12/09 0811) Temp Source: Oral (12/09 0811) BP: 144/78 (12/09 0811) Pulse Rate: 90 (12/09 0811)  Labs: Recent Labs    10/02/20 0102 10/02/20 0345 10/02/20 2149 10/03/20 0231 10/03/20 0549 10/03/20 0554 10/03/20 1517 10/03/20 1518 10/03/20 1608 10/03/20 1918 10/04/20 0149 10/04/20 0604  HGB 9.9*  --   --   --   --   --  10.0*  --   --  11.5*  --  9.9*  HCT 29.4*  --   --   --   --   --  28.0*  --   --  32.9*  --  28.9*  PLT 242  --   --   --   --   --  271  --   --  296  --  248  HEPARINUNFRC  --   --   --   --    < >  --   --   --  0.18*  --  0.33 <0.10*  CREATININE 19.46*  --   --   --   --   --   --  20.97*  --   --   --  13.31*  TROPONINIHS 58*   < > 1,991* 3,154*  --  4,408*  --   --   --   --   --   --    < > = values in this interval not displayed.    Estimated Creatinine Clearance: 6.1 mL/min (A) (by C-G formula based on SCr of 13.31 mg/dL (H)).   Medical History: Past Medical History:  Diagnosis Date  . Acute CHF ([place]) 01/2018  . Cardiomyopathy secondary    likely related to HTN heart disease; possibly ETOH related as well  . Chronic combined systolic and diastolic heart failure (HCC)    Echocardiogram 09/22/11: Moderate LVH, EF 03-47%, grade 3 diastolic dysfunction, mild MR, moderate to severe LAE, mild RVE, mild to moderate TR, small to moderate pericardial effusion  . Coronary artery disease    not felt to be candidate for CABG 08/2018; medical thearpy recomended due to high risk of PCI   . Dysrhythmia    aflutter 04/2016, afib 02/2018, not felt to be a candidate for anticoagulation due to non-compliance and ETOH  . ESRD (end stage renal disease) on dialysis ([place])    due to  hypertensive nephrosclerosis; TTS; [address] (09/01/2018)  . History of alcohol abuse   . Hypertension   . Myocardial infarction [facility])    " mild " per daughter  . PEA (Pulseless electrical activity) ([place])    PEA arrest 03/09/18, treated empirically for hyperkalemia, shock x1 for WCT, given amiodarone, ROSC after 10 min of ACLS   Assessment: 89 YOM who presents with chest pain now with significant troponin spike to start IV heparin for ACS. H/H low. Plt wnl. Of note, patient has a h/o on ESRD on HD.   Heparin level now undetectable, unclear why, no issues per nursing. Heparin to stop tonight at 2200 (48h total heparin therapy).   Goal of Therapy:  Heparin level 0.3-0.7 units/ml Monitor platelets by anticoagulation protocol: Yes   Plan:  -Increase heparin to 1700 units/h -Stop heparin at 2200 12/9 -Will defer heparin  level rechecks    Arrie Senate, PharmD, BCPS, [facility] Clinical Pharmacist [phone] Please check AMION for all [place] numbers 10/04/2020

## 2020-10-04 NOTE — Progress Notes (Signed)
This chaplain responded to PMT consult for spiritual care and Advance Directive education.  The Pt. is sleeping and acknowledges the chaplain when his name is spoken.  The Pt. was not able to remain awake.  The chaplain left the AD document/education on the Pt. bedside table.  This chaplain will return for F/U spiritual care.

## 2020-10-04 NOTE — Discharge Summary (Signed)
Physician Discharge Summary  Kenneth [name] PZW:[id number] DOB: 09/29/1956 DOA: 10/02/2020  PCP: [name], [name], MD  Admit date: 10/02/2020 Discharge date: 10/04/2020  Time spent: 45 minutes  Recommendations for Outpatient Follow-up:  Patient will be discharged to home with palliative care to follow.  Patient will need to follow up with primary care provider within one week of discharge.  Continue hemodialysis as scheduled.  Patient should continue medications as prescribed.  Patient should follow a renal diet with 1200 mL fluid restriction per day.   Discharge Diagnoses:  NSTEMI Hypertensive emergency End-stage renal disease Chronic combined systolic and diastolic heart failure History of atrial fibrillation/flutter Hyperlipidemia   Discharge Condition: Stable  Diet recommendation: Renal  Filed Weights   10/03/20 1426 10/03/20 1751 10/04/20 0440  Weight: 88.9 kg 86.7 kg 87.8 kg    History of present illness:  on 10/02/2020 by Dr. [name] a 64 y.o.malewith medical history significant ofcomplex severe multivessel CAD per [facility] 04/2020 felt not to be a candidate for PCI or CABG and being managed medically by cardiology, A. fib/flutter not a candidate for anticoagulation due to noncompliance, PEA arrest in May 2019, chronic combined systolic and diastolic CHF (EF 42-35% [id number] echo done 04/2020), ESRD on HD TTS, hypertension, hyperlipidemia, previous heavy alcohol use, previous tobacco use presented to the ED earlier today via EMS with complaints of an isolated episode of chest pain associated with eating which resolved prior to ED arrival.Took aspirin 324 mg at home. Work-up was not concerning for ACS at that time-EKG with no acute changes and troponins mildly elevated but flat. He was discharged from the ED with plan to go to [facility] kidney center for routine dialysis.Patient returned to the ED justa few hours later via EMS with complaints of  chest pain.Patient states that after he was discharged from the ED he went home and ate ice. Soon after he started experiencing left-sided throbbing chest pain associated with nausea and vomiting which made him return to the ED. States his chest pain did get better after he received sublingual nitroglycerin but has now started again and is 5 out of 10 in intensity. Denies abdominal pain. He is no longer nauseous or vomiting. States his last dialysis session was on Thursday last week. He missed dialysis on Saturday and today. Patient his blood pressure is always high and he is not sure which medications as his daughter manages them.   Hospital Course:  NSTEMI -Patient presented with elevated troponin at 58 however now up to 4408 -History of complex severe multivessel CAD per cath in July 2021 and felt not to be a good candidate for PCI or CABG and has been medically managed by cardiology -Patient with an isolated episode of chest pain which occurred prior to admission -EKG showed T wave inversions in inferior lateral leads similar to prior tracings, and new ST depressions in lateral leads -Cardiology consulted and appreciated-recommended starting home medications and considering palliative care consultation for long-term chronic management -Continue heparin and sublingual nitroglycerin as needed for chest pain -Palliative care consulted and appreciated  Hypertensive emergency -In the setting of missed dialysis (patient tells me his last dialysis session was on 09/29/2020) -Patient could not tolerate the antihypertensive medications that he is on -Placed on nitroglycerin drip-this has been weaned off -Has been improving with dialysis  End-stage renal disease -Patient dialyzes on Tuesday, Thursday, Saturday -Nephrology consulted and appreciated   Chronic combined systolic and diastolic heart failure -Currently appears to be euvolemic and  compensated -Echocardiogram July 2021 shows an EF of  55 to 27%, grade 1 diastolic dysfunction -Monitor intake output, weights  History of atrial fibrillation/flutter -Followed by cardiology -Patient does not a candidate for anticoagulation due to noncompliant -Currently in sinus rhythm  Hyperlipidemia -Continue home medication  Goals of care discussion -Cardiology does not feel that patient is a candidate for further invasive procedures. -Palliative care consulted and appreciated -CODE STATUS changed to partial code, patient is a DNI -Palliative care recommended outpatient palliative care to follow patient.  He does wish to continue with hemodialysis.  Consultants Cardiology Nephrology Palliative care  Procedures  None   Discharge Exam: Vitals:   10/04/20 0916 10/04/20 1206  BP: 123/65 (!) 162/65  Pulse:  74  Resp: 16 15  Temp:  98.4 F (36.9 C)  SpO2:       General: Well developed, chronically ill-appearing, NAD   HEENT: NCAT, mucous membranes moist.  Cardiovascular: S1 S2 auscultated, RRR  Respiratory: Clear to auscultation bilaterally  Abdomen: Soft, nontender, nondistended, + bowel sounds  Extremities: warm dry without cyanosis clubbing  Neuro: AAOx3, nonfocal  Psych: appropriate mood and affect  Discharge Instructions Discharge Instructions    Discharge instructions   Complete by: As directed    Patient will be discharged to home with palliative care to follow.  Patient will need to follow up with primary care provider within one week of discharge.  Continue hemodialysis as scheduled.  Patient should continue medications as prescribed.  Patient should follow a renal diet with 1200 mL fluid restriction per day.   Increase activity slowly   Complete by: As directed      Allergies as of 10/04/2020   No Known Allergies     Medication List    TAKE these medications   amLODipine 10 MG tablet Commonly known as: NORVASC Take 1 tablet (10 mg total) by mouth daily.   aspirin 81 MG EC tablet Take  1 tablet (81 mg total) by mouth daily. Swallow whole.   atorvastatin 80 MG tablet Commonly known as: LIPITOR Take 1 tablet (80 mg total) by mouth daily.   clopidogrel 75 MG tablet Commonly known as: PLAVIX Take 1 tablet by mouth once daily   isosorbide mononitrate 30 MG 24 hr tablet Commonly known as: IMDUR Take 3 tablets (90 mg total) by mouth daily.   metoprolol succinate 25 MG 24 hr tablet Commonly known as: TOPROL-XL Take 1 tablet (25 mg total) by mouth 4 (four) times a week. Take on Sunday, Monday, Wednesday, Friday What changed:   when to take this  additional instructions   nitroGLYCERIN 0.4 MG SL tablet Commonly known as: NITROSTAT DISSOLVE ONE TABLET UNDER THE TONGUE EVERY 5 MINUTES AS NEEDED FOR CHEST PAIN.  DO NOT EXCEED A TOTAL OF 3 DOSES IN 15 MINUTES What changed: See the new instructions.   Tylenol PM Extra Strength 25-500 MG Tabs tablet Generic drug: diphenhydramine-acetaminophen Take 2 tablets by mouth at bedtime as needed (Pain, Sleep).      No Known Allergies  Follow-up Information    [name], [name], MD. Schedule an appointment as soon as possible for a visit in 1 week(s).   Specialty: Cardiology Why: Hospital follow-up Contact information: [address] [place] [address] [phone]                The results of significant diagnostics from this hospitalization (including imaging, microbiology, ancillary and laboratory) are listed below for reference.    Significant Diagnostic Studies:  DG Chest 2 View  Result Date: 10/02/2020 CLINICAL DATA:  Chest pain EXAM: CHEST - 2 VIEW COMPARISON:  September 29, 2020 FINDINGS: The heart size and mediastinal contours are within normal limits. Aortic knob calcifications are seen. Both lungs are clear. The visualized skeletal structures are unremarkable. IMPRESSION: No active cardiopulmonary disease. Electronically Signed   By: Prudencio Pair M.D.   On: 10/02/2020 01:17   DG Chest 2  View  Result Date: 09/29/2020 CLINICAL DATA:  Chest pain EXAM: CHEST - 2 VIEW COMPARISON:  09/29/2020 at 12:04 a.m. FINDINGS: The heart size and mediastinal contours are within normal limits. Both lungs are clear. The visualized skeletal structures are unremarkable. IMPRESSION: No active cardiopulmonary disease. Electronically Signed   By: Randa Ngo M.D.   On: 09/29/2020 21:29   DG Chest 2 View  Result Date: 09/29/2020 CLINICAL DATA:  Chest pain for several hours EXAM: CHEST - 2 VIEW COMPARISON:  09/24/2020 FINDINGS: The heart size and mediastinal contours are within normal limits. Both lungs are clear. The visualized skeletal structures are unremarkable. IMPRESSION: No active cardiopulmonary disease. Electronically Signed   By: Inez Catalina M.D.   On: 09/29/2020 12:19   DG Chest Portable 1 View  Result Date: 09/24/2020 CLINICAL DATA:  Left-sided chest pain. EXAM: PORTABLE CHEST 1 VIEW COMPARISON:  09/21/2020 and 09/02/2020 FINDINGS: Small densities at left lung base may represent atelectasis. Otherwise, the lungs are clear. Negative for pulmonary edema. Heart and mediastinum are within normal limits. Atherosclerotic calcifications in the thoracic aorta. Negative for pneumothorax. Bone structures are unremarkable. IMPRESSION: No acute cardiopulmonary disease. Electronically Signed   By: Markus Daft M.D.   On: 09/24/2020 08:30   DG Chest Port 1 View  Result Date: 09/21/2020 CLINICAL DATA:  Chest pain short of breath EXAM: PORTABLE CHEST 1 VIEW COMPARISON:  09/02/2020 FINDINGS: Mildly diminished lung volumes. Mild cardiomegaly. Negative for edema, focal opacity or pleural effusion. No pneumothorax. Aortic atherosclerosis IMPRESSION: Mild cardiomegaly. No edema or infiltrate. Electronically Signed   By: Donavan Foil M.D.   On: 09/21/2020 17:11    Microbiology: Recent Results (from the past 240 hour(s))  Resp Panel by RT-PCR (Flu A&B, Covid) Nasopharyngeal Swab     Status: None   Collection  Time: 10/02/20  9:28 PM   Specimen: Nasopharyngeal Swab; Nasopharyngeal(NP) swabs in vial transport medium  Result Value Ref Range Status   SARS Coronavirus 2 by RT PCR NEGATIVE NEGATIVE Final    Comment: (NOTE) SARS-CoV-2 target nucleic acids are NOT DETECTED.  The SARS-CoV-2 RNA is generally detectable in upper respiratory specimens during the acute phase of infection. The lowest concentration of SARS-CoV-2 viral copies this assay can detect is 138 copies/mL. A negative result does not preclude SARS-Cov-2 infection and should not be used as the sole basis for treatment or other patient management decisions. A negative result may occur with  improper specimen collection/handling, submission of specimen other than nasopharyngeal swab, presence of viral mutation(s) within the areas targeted by this assay, and inadequate number of viral copies(<138 copies/mL). A negative result must be combined with clinical observations, patient history, and epidemiological information. The expected result is Negative.  Fact Sheet for Patients:  EntrepreneurPulse.com.au  Fact Sheet for Healthcare Providers:  IncredibleEmployment.be  This test is no t yet approved or cleared by the [place] FDA and  has been authorized for detection and/or diagnosis of SARS-CoV-2 by FDA under an Emergency Use Authorization (EUA). This EUA will remain  in effect (meaning this test can be used)  for the duration of the COVID-19 declaration under Section 564(b)(1) of the Act, 21 U.S.C.section 360bbb-3(b)(1), unless the authorization is terminated  or revoked sooner.       Influenza A by PCR NEGATIVE NEGATIVE Final   Influenza B by PCR NEGATIVE NEGATIVE Final    Comment: (NOTE) The Xpert Xpress SARS-CoV-2/FLU/RSV plus assay is intended as an aid in the diagnosis of influenza from Nasopharyngeal swab specimens and should not be used as a sole basis for treatment. Nasal washings  and aspirates are unacceptable for Xpert Xpress SARS-CoV-2/FLU/RSV testing.  Fact Sheet for Patients: EntrepreneurPulse.com.au  Fact Sheet for Healthcare Providers: IncredibleEmployment.be  This test is not yet approved or cleared by the [place] FDA and has been authorized for detection and/or diagnosis of SARS-CoV-2 by FDA under an Emergency Use Authorization (EUA). This EUA will remain in effect (meaning this test can be used) for the duration of the COVID-19 declaration under Section 564(b)(1) of the Act, 21 U.S.C. section 360bbb-3(b)(1), unless the authorization is terminated or revoked.  Performed at [facility], [place] [address] [place], [place] [address]      Labs: Basic Metabolic Panel: Recent Labs  Lab 09/29/20 1229 09/29/20 2111 10/02/20 0102 10/03/20 1518 10/04/20 0604  NA 139 139 140 136 137  K 4.3 4.6 4.7 5.0 4.1  CL 96* 95* 97* 96* 94*  CO2 23 23 20* 16* 25  GLUCOSE 105* 87 86 69* 82  BUN 67* 67* 95* 112* 57*  CREATININE 14.39* 14.67* 19.46* 20.97* 13.31*  CALCIUM 9.2 9.4 8.6* 8.1* 7.9*  PHOS  --   --   --  >30.0* 7.2*   Liver Function Tests: Recent Labs  Lab 10/03/20 1518 10/04/20 0604  ALBUMIN 3.0* 2.8*   No results for input(s): LIPASE, AMYLASE in the last 168 hours. No results for input(s): AMMONIA in the last 168 hours. CBC: Recent Labs  Lab 09/29/20 2111 10/02/20 0102 10/03/20 1517 10/03/20 1918 10/04/20 0604  WBC 7.2 8.4 8.1 9.9 6.8  NEUTROABS  --   --  4.9  --   --   HGB 11.7* 9.9* 10.0* 11.5* 9.9*  HCT 34.7* 29.4* 28.0* 32.9* 28.9*  MCV 94.6 93.6 90.3 90.6 91.7  PLT 240 242 271 296 248   Cardiac Enzymes: No results for input(s): CKTOTAL, CKMB, CKMBINDEX, TROPONINI in the last 168 hours. BNP: BNP (last 3 results) No results for input(s): BNP in the last 8760 hours.  ProBNP (last 3 results) No results for input(s): PROBNP in the last 8760 hours.  CBG: No results for  input(s): GLUCAP in the last 168 hours.     Signed:  Cristal Ford  [facility] 10/04/2020, 12:34 PM

## 2020-10-04 NOTE — Progress Notes (Signed)
[facility] NEPHROLOGY PROGRESS NOTE  Assessment/ Plan: Pt is a 64 y.o. yo male with A. fib/flutter, hypertension, CHF, multivessel CAD not revascularized, ESRD on HD admitted with NSTEMI.  Outpatient hemodialysis= [facility], TTS, EDW 85.0 kg, 4 hours, 2K, 2 calcium bath, heparin 2400 units, calcitriol 2.25 mcg p.o. q. dialysis, Sensipar 30 q. hemodialysis, no ESA, Access left upper arm AV fistula  #NSTEMI with known severe complex multivessel disease not to be felt candidate for bypass surgery or PCI.  Currently on medical treatment.  Palliative consult service has seen the patient.  # ESRD TTS: Missed 2 session of dialysis before admission.  Received dialysis yesterday with 2 L UF.  Plan for regular treatment today.  # Anemia: Monitor hemoglobin, continue ESA.  # Secondary hyperparathyroidism: Phosphorus elevated, continue binders, need adherence with outpatient dialysis.  # HTN/volume: Blood pressure and volume status acceptable.  In room air.  #Chronic atrial fibrillation/flutter: Not a candidate for anticoagulation, monitor heart rate.  #Dispo: Okay to discharge from renal perspective.  Subjective: Seen and examined at bedside.  Patient reports feeling good without any symptoms.  Denies nausea vomiting chest pain shortness of breath. Objective Vital signs in last 24 hours: Vitals:   10/04/20 0440 10/04/20 0811 10/04/20 0816 10/04/20 0916  BP: 111/76 (!) 144/78 (!) 157/75 123/65  Pulse: 89 90    Resp: 17 20 17 16   Temp: 98.7 F (37.1 C) 98.3 F (36.8 C)    TempSrc: Oral Oral    SpO2:      Weight: 87.8 kg     Height:       Weight change: -11.1 kg  Intake/Output Summary (Last 24 hours) at 10/04/2020 1044 Last data filed at 10/03/2020 1751 Gross per 24 hour  Intake --  Output 2000 ml  Net -2000 ml       Labs: Basic Metabolic Panel: Recent Labs  Lab 10/02/20 0102 10/03/20 1518 10/04/20 0604  NA 140 136 137  K 4.7 5.0 4.1  CL 97* 96* 94*  CO2 20*  16* 25  GLUCOSE 86 69* 82  BUN 95* 112* 57*  CREATININE 19.46* 20.97* 13.31*  CALCIUM 8.6* 8.1* 7.9*  PHOS  --  >30.0* 7.2*   Liver Function Tests: Recent Labs  Lab 10/03/20 1518 10/04/20 0604  ALBUMIN 3.0* 2.8*   No results for input(s): LIPASE, AMYLASE in the last 168 hours. No results for input(s): AMMONIA in the last 168 hours. CBC: Recent Labs  Lab 09/29/20 2111 10/02/20 0102 10/03/20 1517 10/03/20 1918 10/04/20 0604  WBC 7.2 8.4 8.1 9.9 6.8  NEUTROABS  --   --  4.9  --   --   HGB 11.7* 9.9* 10.0* 11.5* 9.9*  HCT 34.7* 29.4* 28.0* 32.9* 28.9*  MCV 94.6 93.6 90.3 90.6 91.7  PLT 240 242 271 296 248   Cardiac Enzymes: No results for input(s): CKTOTAL, CKMB, CKMBINDEX, TROPONINI in the last 168 hours. CBG: No results for input(s): GLUCAP in the last 168 hours.  Iron Studies: No results for input(s): IRON, TIBC, TRANSFERRIN, FERRITIN in the last 72 hours. Studies/Results: No results found.  Medications: Infusions: . heparin 1,700 Units/hr (10/04/20 0955)  . nitroGLYCERIN Stopped (10/04/20 8119)    Scheduled Medications: . aspirin EC  81 mg Oral Daily  . calcitRIOL  2.25 mcg Oral Q T,Th,Sa-HD  . Chlorhexidine Gluconate Cloth  6 each Topical Q0600  . cinacalcet  30 mg Oral Q T,Th,Sa-HD  . darbepoetin (ARANESP) injection - DIALYSIS  40 mcg Intravenous Q  Thu-HD    have reviewed scheduled and prn medications.  Physical Exam: General:NAD, comfortable Heart:RRR, s1s2 nl Lungs:clear b/l, no crackle Abdomen:soft, Non-tender, non-distended Extremities:No edema Dialysis Access: Left AV fistula has good thrill and bruit.  Jerret Mcbane Prasad Merin Borjon 10/04/2020,10:44 AM  LOS: 2 days  Pager: [phone]

## 2020-10-05 ENCOUNTER — Other Ambulatory Visit: Payer: Self-pay

## 2020-10-05 DIAGNOSIS — Z992 Dependence on renal dialysis: Secondary | ICD-10-CM | POA: Diagnosis not present

## 2020-10-05 DIAGNOSIS — N2581 Secondary hyperparathyroidism of renal origin: Secondary | ICD-10-CM | POA: Diagnosis not present

## 2020-10-05 DIAGNOSIS — D631 Anemia in chronic kidney disease: Secondary | ICD-10-CM | POA: Diagnosis not present

## 2020-10-05 DIAGNOSIS — N186 End stage renal disease: Secondary | ICD-10-CM | POA: Diagnosis not present

## 2020-10-05 NOTE — Patient Outreach (Signed)
[facility]) Care Management  10/05/2020  ARLIS YALE 1956/09/26 [id number]   New Referral: Hospital discharge after NSTEMI  Reviewed medical record.  Placed call to patient and daughter, Danae Chen, answered and reports that patient is at dialysis and will be back around 6 pm.  I informed daughter I needed to get patients permission to speak with her and she voiced understanding.  Will plan to call back on Monday to assess needs.  PLAN: call back to speak with patient on 10/08/2020.  [name], RN, BSN, CEN [facility] [employer] [phone]

## 2020-10-06 DIAGNOSIS — N186 End stage renal disease: Secondary | ICD-10-CM | POA: Diagnosis not present

## 2020-10-06 DIAGNOSIS — D631 Anemia in chronic kidney disease: Secondary | ICD-10-CM | POA: Diagnosis not present

## 2020-10-06 DIAGNOSIS — N2581 Secondary hyperparathyroidism of renal origin: Secondary | ICD-10-CM | POA: Diagnosis not present

## 2020-10-06 DIAGNOSIS — Z992 Dependence on renal dialysis: Secondary | ICD-10-CM | POA: Diagnosis not present

## 2020-10-08 ENCOUNTER — Other Ambulatory Visit: Payer: Self-pay

## 2020-10-08 NOTE — Patient Outreach (Signed)
[facility]) Care Management  10/08/2020  Kenneth [name] [birth year] [id number]   Telephone assessment:  Placed call to patient with no answer. Left a message requesting a call back.   PLAN: will mail outreach letter and attempt again in 3 days if no response to voice mail.  [name], RN, BSN, CEN [facility] [employer] [phone]

## 2020-10-09 ENCOUNTER — Encounter: Payer: Self-pay | Admitting: Cardiology

## 2020-10-09 ENCOUNTER — Other Ambulatory Visit: Payer: Self-pay

## 2020-10-09 ENCOUNTER — Ambulatory Visit: Payer: Medicare Other | Admitting: Cardiology

## 2020-10-09 VITALS — BP 128/72 | HR 74 | Ht 71.0 in | Wt 196.0 lb

## 2020-10-09 DIAGNOSIS — I25119 Atherosclerotic heart disease of native coronary artery with unspecified angina pectoris: Secondary | ICD-10-CM

## 2020-10-09 DIAGNOSIS — I4891 Unspecified atrial fibrillation: Secondary | ICD-10-CM

## 2020-10-09 DIAGNOSIS — Z992 Dependence on renal dialysis: Secondary | ICD-10-CM

## 2020-10-09 DIAGNOSIS — I214 Non-ST elevation (NSTEMI) myocardial infarction: Secondary | ICD-10-CM

## 2020-10-09 DIAGNOSIS — I4729 Other ventricular tachycardia: Secondary | ICD-10-CM

## 2020-10-09 DIAGNOSIS — I4892 Unspecified atrial flutter: Secondary | ICD-10-CM

## 2020-10-09 DIAGNOSIS — I5032 Chronic diastolic (congestive) heart failure: Secondary | ICD-10-CM

## 2020-10-09 DIAGNOSIS — N186 End stage renal disease: Secondary | ICD-10-CM

## 2020-10-09 MED ORDER — METOPROLOL SUCCINATE ER 25 MG PO TB24
25.0000 mg | ORAL_TABLET | Freq: Every day | ORAL | 3 refills | Status: DC
Start: 2020-10-09 — End: 2020-10-20

## 2020-10-09 MED ORDER — CLOPIDOGREL BISULFATE 75 MG PO TABS
75.0000 mg | ORAL_TABLET | Freq: Every day | ORAL | 3 refills | Status: DC
Start: 2020-10-09 — End: 2020-12-19

## 2020-10-09 MED ORDER — ISOSORBIDE MONONITRATE ER 30 MG PO TB24
90.0000 mg | ORAL_TABLET | Freq: Every day | ORAL | 3 refills | Status: DC
Start: 2020-10-09 — End: 2020-10-20

## 2020-10-09 MED ORDER — ATORVASTATIN CALCIUM 80 MG PO TABS
80.0000 mg | ORAL_TABLET | Freq: Every day | ORAL | 3 refills | Status: DC
Start: 2020-10-09 — End: 2020-12-19

## 2020-10-09 MED ORDER — AMLODIPINE BESYLATE 10 MG PO TABS
10.0000 mg | ORAL_TABLET | Freq: Every day | ORAL | 3 refills | Status: DC
Start: 2020-10-09 — End: 2020-10-20

## 2020-10-09 NOTE — Patient Instructions (Signed)
Medication Instructions:  Continue current medications  *If you need a refill on your cardiac medications before your next appointment, please call your pharmacy*   Lab Work: None Ordered   Testing/Procedures: None Ordered   Follow-Up: At [facility], you and your health needs are our priority.  As part of our continuing mission to provide you with exceptional heart care, we have created designated Provider Care Teams.  These Care Teams include your primary Cardiologist (physician) and Advanced Practice Providers (APPs -  Physician Assistants and Nurse Practitioners) who all work together to provide you with the care you need, when you need it.  We recommend signing up for the patient portal called "MyChart".  Sign up information is provided on this After Visit Summary.  MyChart is used to connect with patients for Virtual Visits (Telemedicine).  Patients are able to view lab/test results, encounter notes, upcoming appointments, etc.  Non-urgent messages can be sent to your provider as well.   To learn more about what you can do with MyChart, go to [email or web address].    Your next appointment:   6 month(s)  The format for your next appointment:   In Person  Provider:   You may see Peter Jordan, MD or one of the following Advanced Practice Providers on your designated Care Team:    Hao Meng, PA-C  Angela Duke, PA-C or   Krista Kroeger, PA-C      

## 2020-10-09 NOTE — Progress Notes (Signed)
Cardiology Office Note:    Date:  10/09/2020   ID:  Kenneth [name], DOB 1956-02-27, MRN [id number]  PCP:  Kenneth Ruddy, MD  Cardiologist:  Kenneth [name], MD  Electrophysiologist:  None   Referring MD: Kenneth Ruddy, MD   CC: post hospital follow up  History of Present Illness:    Kenneth [name] is a 64 y.o. male with a hx of severe CAD at cath July 2021- not felt to be a candidate for CABG or PCI- PAF not a candidate for [facility] secondary to non compliance, and ESRD on HD Tues, Thur, Sat.  He was recently admitted 10/02/2020 with a NSTEMI after he apparently ran out of his medications.  He was hypertensive in the ED.  Troponin peak was 4400.  He was placed back on his medications.  He is seen today for a TOC follow up.  He denies chest pain.   Past Medical History:  Diagnosis Date  . Acute CHF ([place]) 01/2018  . Cardiomyopathy secondary    likely related to HTN heart disease; possibly ETOH related as well  . Chronic combined systolic and diastolic heart failure (HCC)    Echocardiogram 09/22/11: Moderate LVH, EF 40-08%, grade 3 diastolic dysfunction, mild MR, moderate to severe LAE, mild RVE, mild to moderate TR, small to moderate pericardial effusion  . Coronary artery disease    not felt to be candidate for CABG 08/2018; medical thearpy recomended due to high risk of PCI   . Dysrhythmia    aflutter 04/2016, afib 02/2018, not felt to be a candidate for anticoagulation due to non-compliance and ETOH  . ESRD (end stage renal disease) on dialysis ([place])    due to hypertensive nephrosclerosis; TTS; [address] (09/01/2018)  . History of alcohol abuse   . Hypertension   . Myocardial infarction [facility])    " mild " per daughter  . PEA (Pulseless electrical activity) ([place])    PEA arrest 03/09/18, treated empirically for hyperkalemia, shock x1 for WCT, given amiodarone, ROSC after 10 min of ACLS    Past Surgical History:  Procedure Laterality Date  . AV FISTULA PLACEMENT Left 05/14/2016    Procedure: LEFT ARM BASILIC VEIN TRANSPOSITION;  Surgeon: Rosetta Posner, MD;  Location: [place];  Service: Vascular;  Laterality: Left;  . INSERTION OF DIALYSIS CATHETER N/A 06/20/2019   Procedure: INSERTION OF TUNNELED  DIALYSIS CATHETER;  Surgeon: Elam Dutch, MD;  Location: [place];  Service: Vascular;  Laterality: N/A;  . LEFT HEART CATH AND CORONARY ANGIOGRAPHY N/A 09/02/2018   Procedure: LEFT HEART CATH AND CORONARY ANGIOGRAPHY;  Surgeon: Nelva Bush, MD;  Location: [place] CV LAB;  Service: Cardiovascular;  Laterality: N/A;  . LEFT HEART CATH AND CORONARY ANGIOGRAPHY N/A 05/14/2020   Procedure: LEFT HEART CATH AND CORONARY ANGIOGRAPHY;  Surgeon: Sherren Mocha, MD;  Location: [place] CV LAB;  Service: Cardiovascular;  Laterality: N/A;  . PERIPHERAL VASCULAR CATHETERIZATION N/A 05/13/2016   Procedure: Dialysis/Perma Catheter Insertion;  Surgeon: Serafina Mitchell, MD;  Location: [place] CV LAB;  Service: Cardiovascular;  Laterality: N/A;  . REVISION OF ARTERIOVENOUS GORETEX GRAFT Left 6/76/1950   Procedure: PLICATION OF ARTERIOVENOUS FISTULA LEFT ARM;  Surgeon: Elam Dutch, MD;  Location: [facility];  Service: Vascular;  Laterality: Left;    Current Medications: Current Meds  Medication Sig  . amLODipine (NORVASC) 10 MG tablet Take 1 tablet (10 mg total) by mouth daily.  Marland Kitchen aspirin EC 81 MG EC tablet Take 1  tablet (81 mg total) by mouth daily. Swallow whole.  Marland Kitchen atorvastatin (LIPITOR) 80 MG tablet Take 1 tablet (80 mg total) by mouth daily.  . clopidogrel (PLAVIX) 75 MG tablet Take 1 tablet by mouth once daily (Patient taking differently: Take 75 mg by mouth daily.)  . diphenhydramine-acetaminophen (TYLENOL PM EXTRA STRENGTH) 25-500 MG TABS tablet Take 2 tablets by mouth at bedtime as needed (Pain, Sleep).   . isosorbide mononitrate (IMDUR) 30 MG 24 hr tablet Take 3 tablets (90 mg total) by mouth daily.  . metoprolol succinate (TOPROL-XL) 25 MG 24 hr tablet Take 1 tablet (25  mg total) by mouth 4 (four) times a week. Take on Sunday, Monday, Wednesday, Friday (Patient taking differently: Take 25 mg by mouth daily.)  . nitroGLYCERIN (NITROSTAT) 0.4 MG SL tablet Place 1 tablet (0.4 mg total) under the tongue every 5 (five) minutes as needed for chest pain (max 3 doses).     Allergies:   Patient has no known allergies.   Social History   Socioeconomic History  . Marital status: Divorced    Spouse name: Not on file  . Number of children: Not on file  . Years of education: Not on file  . Highest education level: Not on file  Occupational History  . Occupation: retired  Tobacco Use  . Smoking status: Former Smoker    Packs/day: 2.00    Years: 48.00    Pack years: 96.00    Types: Cigars    Quit date: 10/27/2017    Years since quitting: 2.9  . Smokeless tobacco: Former Systems developer    Types: Secondary school teacher  . Vaping Use: Never used  Substance and Sexual Activity  . Alcohol use: Not Currently    Comment: h/o very heavy use, quit about 2019  . Drug use: No  . Sexual activity: Not Currently  Other Topics Concern  . Not on file  Social History Narrative  . Not on file   Social Determinants of Health   Financial Resource Strain: Not on file  Food Insecurity: Not on file  Transportation Needs: Not on file  Physical Activity: Not on file  Stress: Not on file  Social Connections: Not on file     Family History: The patient's family history includes Cirrhosis in his father; Emphysema in his mother.  ROS:   Please see the history of present illness.     All other systems reviewed and are negative.  EKGs/Labs/Other Studies Reviewed:    The following studies were reviewed today: Cath 05/14/2020-  Conclusion  1.  Severe multivessel coronary artery disease with severe calcific distal left main stenosis, severe diffuse ostial/proximal LAD stenosis, severe stenosis of the proximal left circumflex/first OM, and interval occlusion of the distal RCA with  left-to-right collaterals 2.  Heavily calcified distal abdominal aorta and bilateral iliac arteries with mild diffuse calcified disease.  No severe iliac lesions identified.  Recommend ongoing medical therapy.  With total occlusion of the RCA and severe calcification of the distal left main and complex disease in the LAD and left circumflex, I think risk of protected PCI would be extremely high.  I also think his access is marginal for large bore support devices from the groin.   Echo 05/13/2020- IMPRESSIONS    1. Left ventricular ejection fraction, by estimation, is 55 to 60%. The  left ventricle has normal function. The left ventricle has no regional  wall motion abnormalities. There is moderate left ventricular hypertrophy.  Left ventricular diastolic  parameters are consistent with Grade I diastolic dysfunction (impaired  relaxation).  2. Right ventricular systolic function is normal. The right ventricular  size is normal. There is normal pulmonary artery systolic pressure.  3. Left atrial size was mildly dilated.  4. The mitral valve is abnormal. Moderate mitral valve regurgitation.  5. The aortic valve is tricuspid. Aortic valve regurgitation is not  visualized. Mild aortic valve sclerosis is present, with no evidence of  aortic valve stenosis.   EKG:  EKG is ordered today.  The ekg ordered today demonstrates NSR, later TWI, HR 74, PVC  Recent Labs: 09/21/2020: ALT 9 09/23/2020: Magnesium 1.8 10/04/2020: BUN 57; Creatinine, Ser 13.31; Hemoglobin 9.9; Platelets 248; Potassium 4.1; Sodium 137  Recent Lipid Panel    Component Value Date/Time   CHOL 167 09/21/2020 2108   CHOL 193 11/19/2018 0959   TRIG 69 09/21/2020 2108   HDL 44 09/21/2020 2108   HDL 50 11/19/2018 0959   CHOLHDL 3.8 09/21/2020 2108   VLDL 14 09/21/2020 2108   LDLCALC 109 (H) 09/21/2020 2108   LDLCALC 119 (H) 11/19/2018 0959    Physical Exam:    VS:  BP 128/72 (BP Location: Right Arm, Patient  Position: Sitting, Cuff Size: Normal)   Pulse 74   Ht 5\' 11"  (1.803 m)   Wt 196 lb (88.9 kg)   BMI 27.34 kg/m     Wt Readings from Last 3 Encounters:  10/09/20 196 lb (88.9 kg)  10/04/20 193 lb 9 oz (87.8 kg)  10/02/20 220 lb 7.4 oz (100 kg)     GEN:  Well nourished, well developed AA male,  in no acute distress HEENT: Normal NECK: No JVD; No carotid bruits CARDIAC: RRR, no murmurs, rubs, gallops RESPIRATORY:  Clear to auscultation without rales, wheezing or rhonchi  ABDOMEN: Soft, non-tender, non-distended MUSCULOSKELETAL:  No edema; No deformity AVF LUE SKIN: Warm and dry NEUROLOGIC:  Alert and oriented x 3 PSYCHIATRIC:  Normal affect   ASSESSMENT:    NSTEMI- Admitted 10/02/2020- Troponin peak 4400  CAD- Severe CAD at cath July 2021- cines reviewed this admission- not a surgical or PCI candidate- medical Rx  Non compliance- Out of medications on admission 10/02/2020  ESRD- HD Tues, Thur, Sat.    H/O PAF- NSR today, not a candidate for [facility]  PLAN:    Refills for medication x 1 year provided.  F/U in 6 months.    Medication Adjustments/Labs and Tests Ordered: Current medicines are reviewed at length with the patient today.  Concerns regarding medicines are outlined above.  No orders of the defined types were placed in this encounter.  No orders of the defined types were placed in this encounter.   There are no Patient Instructions on file for this visit.   Signed, Kerin Ransom, PA-C  10/09/2020 11:10 AM    [facility]

## 2020-10-10 ENCOUNTER — Ambulatory Visit: Payer: Medicare Other

## 2020-10-10 NOTE — Progress Notes (Signed)
Erroneous Encounter

## 2020-10-10 NOTE — Progress Notes (Deleted)
Subjective:   Kenneth [name] is a 64 y.o. male who presents for an Initial Medicare Annual Wellness Visit.  Review of Systems    N/A        Objective:    There were no vitals filed for this visit. There is no height or weight on file to calculate BMI.  Advanced Directives 10/04/2020 09/29/2020 09/24/2020 09/21/2020 09/21/2020 09/02/2020 05/12/2020  Does Patient Have a Medical Advance Directive? No No No No No No No  Would patient like information on creating a medical advance directive? No - Patient declined No - Patient declined No - Patient declined No - Guardian declined - - No - Patient declined  Pre-existing out of facility DNR order (yellow form or pink MOST form) - - - - - - -    Current Medications (verified) Outpatient Encounter Medications as of 10/10/2020  Medication Sig  . amLODipine (NORVASC) 10 MG tablet Take 1 tablet (10 mg total) by mouth daily.  Marland Kitchen aspirin EC 81 MG EC tablet Take 1 tablet (81 mg total) by mouth daily. Swallow whole.  Marland Kitchen atorvastatin (LIPITOR) 80 MG tablet Take 1 tablet (80 mg total) by mouth daily.  . clopidogrel (PLAVIX) 75 MG tablet Take 1 tablet (75 mg total) by mouth daily.  . diphenhydramine-acetaminophen (TYLENOL PM EXTRA STRENGTH) 25-500 MG TABS tablet Take 2 tablets by mouth at bedtime as needed (Pain, Sleep).   . isosorbide mononitrate (IMDUR) 30 MG 24 hr tablet Take 3 tablets (90 mg total) by mouth daily.  . metoprolol succinate (TOPROL-XL) 25 MG 24 hr tablet Take 1 tablet (25 mg total) by mouth daily.  . nitroGLYCERIN (NITROSTAT) 0.4 MG SL tablet Place 1 tablet (0.4 mg total) under the tongue every 5 (five) minutes as needed for chest pain (max 3 doses).   No facility-administered encounter medications on file as of 10/10/2020.    Allergies (verified) Patient has no known allergies.   History: Past Medical History:  Diagnosis Date  . Acute CHF ([place]) 01/2018  . Cardiomyopathy secondary    likely related to HTN heart disease; possibly  ETOH related as well  . Chronic combined systolic and diastolic heart failure (HCC)    Echocardiogram 09/22/11: Moderate LVH, EF 81-19%, grade 3 diastolic dysfunction, mild MR, moderate to severe LAE, mild RVE, mild to moderate TR, small to moderate pericardial effusion  . Coronary artery disease    not felt to be candidate for CABG 08/2018; medical thearpy recomended due to high risk of PCI   . Dysrhythmia    aflutter 04/2016, afib 02/2018, not felt to be a candidate for anticoagulation due to non-compliance and ETOH  . ESRD (end stage renal disease) on dialysis ([place])    due to hypertensive nephrosclerosis; TTS; [address] (09/01/2018)  . History of alcohol abuse   . Hypertension   . Myocardial infarction [facility])    " mild " per daughter  . PEA (Pulseless electrical activity) ([place])    PEA arrest 03/09/18, treated empirically for hyperkalemia, shock x1 for WCT, given amiodarone, ROSC after 10 min of ACLS   Past Surgical History:  Procedure Laterality Date  . AV FISTULA PLACEMENT Left 05/14/2016   Procedure: LEFT ARM BASILIC VEIN TRANSPOSITION;  Surgeon: Rosetta Posner, MD;  Location: [place];  Service: Vascular;  Laterality: Left;  . INSERTION OF DIALYSIS CATHETER N/A 06/20/2019   Procedure: INSERTION OF TUNNELED  DIALYSIS CATHETER;  Surgeon: Elam Dutch, MD;  Location: [place];  Service: Vascular;  Laterality: N/A;  .  LEFT HEART CATH AND CORONARY ANGIOGRAPHY N/A 09/02/2018   Procedure: LEFT HEART CATH AND CORONARY ANGIOGRAPHY;  Surgeon: Nelva Bush, MD;  Location: [place] CV LAB;  Service: Cardiovascular;  Laterality: N/A;  . LEFT HEART CATH AND CORONARY ANGIOGRAPHY N/A 05/14/2020   Procedure: LEFT HEART CATH AND CORONARY ANGIOGRAPHY;  Surgeon: Sherren Mocha, MD;  Location: [place] CV LAB;  Service: Cardiovascular;  Laterality: N/A;  . PERIPHERAL VASCULAR CATHETERIZATION N/A 05/13/2016   Procedure: Dialysis/Perma Catheter Insertion;  Surgeon: Serafina Mitchell, MD;  Location: [place]  CV LAB;  Service: Cardiovascular;  Laterality: N/A;  . REVISION OF ARTERIOVENOUS GORETEX GRAFT Left 03/04/3266   Procedure: PLICATION OF ARTERIOVENOUS FISTULA LEFT ARM;  Surgeon: Elam Dutch, MD;  Location: [facility] OR;  Service: Vascular;  Laterality: Left;   Family History  Problem Relation Age of Onset  . Emphysema Mother   . Cirrhosis Father    Social History   Socioeconomic History  . Marital status: Divorced    Spouse name: Not on file  . Number of children: Not on file  . Years of education: Not on file  . Highest education level: Not on file  Occupational History  . Occupation: retired  Tobacco Use  . Smoking status: Former Smoker    Packs/day: 2.00    Years: 48.00    Pack years: 96.00    Types: Cigars    Quit date: 10/27/2017    Years since quitting: 2.9  . Smokeless tobacco: Former Systems developer    Types: Secondary school teacher  . Vaping Use: Never used  Substance and Sexual Activity  . Alcohol use: Not Currently    Comment: h/o very heavy use, quit about 2019  . Drug use: No  . Sexual activity: Not Currently  Other Topics Concern  . Not on file  Social History Narrative  . Not on file   Social Determinants of Health   Financial Resource Strain: Not on file  Food Insecurity: Not on file  Transportation Needs: Not on file  Physical Activity: Not on file  Stress: Not on file  Social Connections: Not on file    Tobacco Counseling Counseling given: Not Answered   Clinical Intake:                 Diabetic?No          Activities of Daily Living In your present state of health, do you have any difficulty performing the following activities: 10/04/2020 09/24/2020  Hearing? N N  Vision? N N  Difficulty concentrating or making decisions? N N  Walking or climbing stairs? N N  Dressing or bathing? N N  Doing errands, shopping? N N  Some recent data might be hidden    Patient Care Team: [name], MD as PCP - General (Family Medicine) [name],  [name], MD as PCP - Cardiology (Cardiology) Verl Blalock, Marijo Conception, MD (Inactive) as Consulting Physician (Cardiology) Salem Senate, MD as Consulting Physician (Nephrology) Thana Ates, RN as [place] any recent [place] you may have received from other than [facility] providers in the past year (date may be approximate).     Assessment:   This is a routine wellness examination for Kenneth [name].  Hearing/Vision screen No exam data present  Dietary issues and exercise activities discussed:    Goals   None    Depression Screen No flowsheet data found.  Fall Risk No flowsheet data found.  FALL RISK PREVENTION PERTAINING TO  THE HOME:  Any stairs in or around the home? {YES/NO:21197} If so, are there any without handrails? No  Home free of loose throw rugs in walkways, pet beds, electrical cords, etc? Yes  Adequate lighting in your home to reduce risk of falls? Yes   ASSISTIVE DEVICES UTILIZED TO PREVENT FALLS:  Life alert? {YES/NO:21197} Use of a cane, walker or w/c? {YES/NO:21197} Grab bars in the bathroom? {YES/NO:21197} Shower chair or bench in shower? {YES/NO:21197} Elevated toilet seat or a handicapped toilet? {YES/NO:21197}    Cognitive Function:        Immunizations Immunization History  Administered Date(s) Administered  . Hepatitis B, adult 06/07/2016, 07/12/2016, 08/09/2016, 02/09/2020, 03/08/2020  . Hepb-cpg 04/05/2020, 08/09/2020  . Influenza Split 09/22/2011  . Influenza, High Dose Seasonal PF 07/30/2019, 07/30/2019, 09/06/2020  . Moderna Sars-Covid-2 Vaccination 12/22/2019, 01/19/2020  . Pneumococcal Conjugate-13 09/13/2016  . Pneumococcal Polysaccharide-23 12/04/2016, 05/12/2018    TDAP status: Due, Education has been provided regarding the importance of this vaccine. Advised may receive this vaccine at local pharmacy or Health Dept. Aware to provide a copy of the vaccination record if obtained from local  pharmacy or Health Dept. Verbalized acceptance and understanding.  {Flu Vaccine status:2101806}  {Pneumococcal vaccine status:2101807}  Covid-19 vaccine status: Completed vaccines  Qualifies for Shingles Vaccine? Yes   Zostavax completed No   Shingrix Completed?: No.    Education has been provided regarding the importance of this vaccine. Patient has been advised to call insurance company to determine out of pocket expense if they have not yet received this vaccine. Advised may also receive vaccine at local pharmacy or Health Dept. Verbalized acceptance and understanding.  Screening Tests Health Maintenance  Topic Date Due  . TETANUS/TDAP  Never done  . COLONOSCOPY  Never done  . COVID-19 Vaccine (3 - Booster for Moderna series) 07/21/2020  . PNA vac Low Risk Adult (2 of 2 - PPSV23) 05/13/2023  . INFLUENZA VACCINE  Completed  . Hepatitis C Screening  Completed    Health Maintenance  Health Maintenance Due  Topic Date Due  . TETANUS/TDAP  Never done  . COLONOSCOPY  Never done  . COVID-19 Vaccine (3 - Booster for Moderna series) 07/21/2020    Colorectal cancer screening: Referral to GI placed 10/10/2020. Pt aware the office will call re: appt.  Lung Cancer Screening: (Low Dose CT Chest recommended if Age 87-80 years, 30 pack-year currently smoking OR have quit w/in 15years.) does qualify.   Lung Cancer Screening Referral: Yes   Additional Screening:  Hepatitis C Screening: does qualify; Completed 05/13/2016  Vision Screening: Recommended annual ophthalmology exams for early detection of glaucoma and other disorders of the eye. Is the patient up to date with their annual eye exam?  {YES/NO:21197} Who is the provider or what is the name of the office in which the patient attends annual eye exams? *** If pt is not established with a provider, would they like to be referred to a provider to establish care? {YES/NO:21197}.   Dental Screening: Recommended annual dental exams  for proper oral hygiene  Community Resource Referral / Chronic Care Management: CRR required this visit?  {YES/NO:21197}  CCM required this visit?  {YES/NO:21197}     Plan:     I have personally reviewed and noted the following in the patient's chart:   . Medical and social history . Use of alcohol, tobacco or illicit drugs  . Current medications and supplements . Functional ability and status . Nutritional status . Physical activity .  Advanced directives . List of other physicians . Hospitalizations, surgeries, and ER visits in previous 12 months . Vitals . Screenings to include cognitive, depression, and falls . Referrals and appointments  In addition, I have reviewed and discussed with patient certain preventive protocols, quality metrics, and best practice recommendations. A written personalized care plan for preventive services as well as general preventive health recommendations were provided to patient.     Ofilia Neas, LPN   [id number]   Nurse Notes: ***

## 2020-10-11 ENCOUNTER — Other Ambulatory Visit: Payer: Self-pay

## 2020-10-11 DIAGNOSIS — N186 End stage renal disease: Secondary | ICD-10-CM | POA: Diagnosis not present

## 2020-10-11 DIAGNOSIS — N2581 Secondary hyperparathyroidism of renal origin: Secondary | ICD-10-CM | POA: Diagnosis not present

## 2020-10-11 DIAGNOSIS — D631 Anemia in chronic kidney disease: Secondary | ICD-10-CM | POA: Diagnosis not present

## 2020-10-11 DIAGNOSIS — Z992 Dependence on renal dialysis: Secondary | ICD-10-CM | POA: Diagnosis not present

## 2020-10-11 NOTE — Patient Outreach (Signed)
[facility]) Care Management  10/11/2020  LUCIFER SOJA [birth year] [phone]   Telephone assessment:  Placed call to patient with no answer. Left a voicemail.  PLAN: Will attempt 4th outreach in 4 weeks.  Letter already mailed.  [name], RN, BSN, CEN [facility] [employer] [phone]

## 2020-10-13 ENCOUNTER — Emergency Department (HOSPITAL_COMMUNITY)
Admission: EM | Admit: 2020-10-13 | Discharge: 2020-10-13 | Disposition: A | Discharge status: Home / Self Care | Payer: Medicare Other | Source: Home / Self Care | Attending: Emergency Medicine | Admitting: Emergency Medicine

## 2020-10-13 ENCOUNTER — Other Ambulatory Visit: Payer: Self-pay

## 2020-10-13 DIAGNOSIS — I132 Hypertensive heart and chronic kidney disease with heart failure and with stage 5 chronic kidney disease, or end stage renal disease: Secondary | ICD-10-CM | POA: Insufficient documentation

## 2020-10-13 DIAGNOSIS — Z87891 Personal history of nicotine dependence: Secondary | ICD-10-CM | POA: Insufficient documentation

## 2020-10-13 DIAGNOSIS — N185 Chronic kidney disease, stage 5: Secondary | ICD-10-CM | POA: Diagnosis not present

## 2020-10-13 DIAGNOSIS — Z7982 Long term (current) use of aspirin: Secondary | ICD-10-CM | POA: Insufficient documentation

## 2020-10-13 DIAGNOSIS — R748 Abnormal levels of other serum enzymes: Secondary | ICD-10-CM | POA: Diagnosis not present

## 2020-10-13 DIAGNOSIS — I120 Hypertensive chronic kidney disease with stage 5 chronic kidney disease or end stage renal disease: Secondary | ICD-10-CM | POA: Diagnosis not present

## 2020-10-13 DIAGNOSIS — I25118 Atherosclerotic heart disease of native coronary artery with other forms of angina pectoris: Secondary | ICD-10-CM | POA: Diagnosis not present

## 2020-10-13 DIAGNOSIS — N2581 Secondary hyperparathyroidism of renal origin: Secondary | ICD-10-CM | POA: Diagnosis not present

## 2020-10-13 DIAGNOSIS — I208 Other forms of angina pectoris: Secondary | ICD-10-CM

## 2020-10-13 DIAGNOSIS — R0789 Other chest pain: Secondary | ICD-10-CM | POA: Diagnosis not present

## 2020-10-13 DIAGNOSIS — I10 Essential (primary) hypertension: Secondary | ICD-10-CM | POA: Diagnosis not present

## 2020-10-13 DIAGNOSIS — R079 Chest pain, unspecified: Secondary | ICD-10-CM | POA: Diagnosis not present

## 2020-10-13 DIAGNOSIS — Z79899 Other long term (current) drug therapy: Secondary | ICD-10-CM | POA: Insufficient documentation

## 2020-10-13 DIAGNOSIS — R52 Pain, unspecified: Secondary | ICD-10-CM | POA: Diagnosis not present

## 2020-10-13 DIAGNOSIS — I5042 Chronic combined systolic (congestive) and diastolic (congestive) heart failure: Secondary | ICD-10-CM | POA: Insufficient documentation

## 2020-10-13 DIAGNOSIS — R062 Wheezing: Secondary | ICD-10-CM | POA: Diagnosis not present

## 2020-10-13 DIAGNOSIS — D631 Anemia in chronic kidney disease: Secondary | ICD-10-CM | POA: Diagnosis not present

## 2020-10-13 DIAGNOSIS — N186 End stage renal disease: Secondary | ICD-10-CM | POA: Insufficient documentation

## 2020-10-13 DIAGNOSIS — R0602 Shortness of breath: Secondary | ICD-10-CM | POA: Diagnosis not present

## 2020-10-13 DIAGNOSIS — Z992 Dependence on renal dialysis: Secondary | ICD-10-CM | POA: Insufficient documentation

## 2020-10-13 LAB — BASIC METABOLIC PANEL
Anion gap: 15 (ref 5–15)
BUN: 46 mg/dL — ABNORMAL HIGH (ref 8–23)
CO2: 30 mmol/L (ref 22–32)
Calcium: 8.4 mg/dL — ABNORMAL LOW (ref 8.9–10.3)
Chloride: 95 mmol/L — ABNORMAL LOW (ref 98–111)
Creatinine, Ser: 11.73 mg/dL — ABNORMAL HIGH (ref 0.61–1.24)
GFR, Estimated: 4 mL/min — ABNORMAL LOW (ref >60)
Glucose, Bld: 84 mg/dL (ref 70–99)
Potassium: 4.5 mmol/L (ref 3.5–5.1)
Sodium: 140 mmol/L (ref 135–145)

## 2020-10-13 LAB — CBC
HCT: 29.7 % — ABNORMAL LOW (ref 39.0–52.0)
Hemoglobin: 9.7 g/dL — ABNORMAL LOW (ref 13.0–17.0)
MCH: 31.2 pg (ref 26.0–34.0)
MCHC: 32.7 g/dL (ref 30.0–36.0)
MCV: 95.5 fL (ref 80.0–100.0)
Platelets: 240 10*3/uL (ref 150–400)
RBC: 3.11 MIL/uL — ABNORMAL LOW (ref 4.22–5.81)
RDW: 17.2 % — ABNORMAL HIGH (ref 11.5–15.5)
WBC: 4.9 10*3/uL (ref 4.0–10.5)
nRBC: 0 % (ref 0.0–0.2)

## 2020-10-13 LAB — TROPONIN I (HIGH SENSITIVITY)
Troponin I (High Sensitivity): 126 ng/L (ref <18)
Troponin I (High Sensitivity): 131 ng/L (ref <18)

## 2020-10-13 MED ORDER — FENTANYL CITRATE (PF) 100 MCG/2ML IJ SOLN
50.0000 ug | Freq: Once | INTRAMUSCULAR | Status: AC
  Administered 2020-10-13: 05:00:00 50 ug via INTRAVENOUS
  Filled 2020-10-13: qty 2

## 2020-10-13 MED ORDER — NITROGLYCERIN 0.4 MG SL SUBL: 0.4000 mg | SUBLINGUAL_TABLET | SUBLINGUAL | 1 refills | Status: DC | PRN

## 2020-10-13 MED ORDER — NITROGLYCERIN 0.4 MG SL SUBL
0.4000 mg | SUBLINGUAL_TABLET | SUBLINGUAL | Status: DC | PRN
Start: 2020-10-13 — End: 2020-10-13
  Administered 2020-10-13 (×2): 0.4 mg via SUBLINGUAL
  Filled 2020-10-13 (×2): qty 1

## 2020-10-13 NOTE — ED Provider Notes (Signed)
[facility] EMERGENCY DEPARTMENT Provider Note  CSN: [id number] Arrival date & time: 10/13/20 0117    History Chief Complaint  Patient presents with  . Chest Pain    HPI  Kenneth [name] is a 64 y.o. male with history of ESRD on HD TTS, severe CAD not amenable to PCI or CABG from Cath in July 2021 and end stage CHF with frequent ED visits for chest pain. Most recently here about a week ago with Trop that peaked at ~21. He reports compliance with dialysis and meds this week, began have severe sharp L parasternal chest pain around 1am but he does not remember what he was doing or where he was at the time. He denies SOB, cough or fever. This is similar to previous ED visits. Pain is improved now to 5/10.    Past Medical History:  Diagnosis Date  . Acute CHF ([place]) 01/2018  . Cardiomyopathy secondary    likely related to HTN heart disease; possibly ETOH related as well  . Chronic combined systolic and diastolic heart failure (HCC)    Echocardiogram 09/22/11: Moderate LVH, EF 87-56%, grade 3 diastolic dysfunction, mild MR, moderate to severe LAE, mild RVE, mild to moderate TR, small to moderate pericardial effusion  . Coronary artery disease    not felt to be candidate for CABG 08/2018; medical thearpy recomended due to high risk of PCI   . Dysrhythmia    aflutter 04/2016, afib 02/2018, not felt to be a candidate for anticoagulation due to non-compliance and ETOH  . ESRD (end stage renal disease) on dialysis ([place])    due to hypertensive nephrosclerosis; TTS; [facility] (09/01/2018)  . History of alcohol abuse   . Hypertension   . Myocardial infarction [facility])    " mild " per daughter  . PEA (Pulseless electrical activity) ([place])    PEA arrest 03/09/18, treated empirically for hyperkalemia, shock x1 for WCT, given amiodarone, ROSC after 10 min of ACLS    Past Surgical History:  Procedure Laterality Date  . AV FISTULA PLACEMENT Left 05/14/2016   Procedure: LEFT ARM BASILIC VEIN  TRANSPOSITION;  Surgeon: Rosetta Posner, MD;  Location: [place];  Service: Vascular;  Laterality: Left;  . INSERTION OF DIALYSIS CATHETER N/A 06/20/2019   Procedure: INSERTION OF TUNNELED  DIALYSIS CATHETER;  Surgeon: Elam Dutch, MD;  Location: [place];  Service: Vascular;  Laterality: N/A;  . LEFT HEART CATH AND CORONARY ANGIOGRAPHY N/A 09/02/2018   Procedure: LEFT HEART CATH AND CORONARY ANGIOGRAPHY;  Surgeon: Nelva Bush, MD;  Location: [place] CV LAB;  Service: Cardiovascular;  Laterality: N/A;  . LEFT HEART CATH AND CORONARY ANGIOGRAPHY N/A 05/14/2020   Procedure: LEFT HEART CATH AND CORONARY ANGIOGRAPHY;  Surgeon: Sherren Mocha, MD;  Location: [place] CV LAB;  Service: Cardiovascular;  Laterality: N/A;  . PERIPHERAL VASCULAR CATHETERIZATION N/A 05/13/2016   Procedure: Dialysis/Perma Catheter Insertion;  Surgeon: Serafina Mitchell, MD;  Location: [place] CV LAB;  Service: Cardiovascular;  Laterality: N/A;  . REVISION OF ARTERIOVENOUS GORETEX GRAFT Left 4/33/2951   Procedure: PLICATION OF ARTERIOVENOUS FISTULA LEFT ARM;  Surgeon: Elam Dutch, MD;  Location[facility] OR;  Service: Vascular;  Laterality: Left;    Family History  Problem Relation Age of Onset  . Emphysema Mother   . Cirrhosis Father     Social History   Tobacco Use  . Smoking status: Former Smoker    Packs/day: 2.00    Years: 48.00    Pack years:  96.00    Types: Cigars    Quit date: 10/27/2017    Years since quitting: 2.9  . Smokeless tobacco: Former Systems developer    Types: Secondary school teacher  . Vaping Use: Never used  Substance Use Topics  . Alcohol use: Not Currently    Comment: h/o very heavy use, quit about 2019  . Drug use: No     Home Medications Prior to Admission medications   Medication Sig Start Date End Date Taking? Authorizing Provider  amLODipine (NORVASC) 10 MG tablet Take 1 tablet (10 mg total) by mouth daily. 10/09/20   Erlene Quan, PA-C  aspirin EC 81 MG EC tablet Take 1 tablet (81 mg  total) by mouth daily. Swallow whole. 05/16/20   Alma Friendly, MD  atorvastatin (LIPITOR) 80 MG tablet Take 1 tablet (80 mg total) by mouth daily. 10/09/20   Erlene Quan, PA-C  clopidogrel (PLAVIX) 75 MG tablet Take 1 tablet (75 mg total) by mouth daily. 10/09/20   Kilroy, Doreene Burke, PA-C  diphenhydramine-acetaminophen (TYLENOL PM EXTRA STRENGTH) 25-500 MG TABS tablet Take 2 tablets by mouth at bedtime as needed (Pain, Sleep).  05/12/18   [provider]  isosorbide mononitrate (IMDUR) 30 MG 24 hr tablet Take 3 tablets (90 mg total) by mouth daily. 10/09/20   Erlene Quan, PA-C  metoprolol succinate (TOPROL-XL) 25 MG 24 hr tablet Take 1 tablet (25 mg total) by mouth daily. 10/09/20   Erlene Quan, PA-C  nitroGLYCERIN (NITROSTAT) 0.4 MG SL tablet Place 1 tablet (0.4 mg total) under the tongue every 5 (five) minutes as needed for chest pain (max 3 doses). 10/13/20   Truddie Hidden, MD     Allergies    Patient has no known allergies.   Review of Systems   Review of Systems A comprehensive review of systems was completed and negative except as noted in HPI.    Physical Exam BP (!) 189/107   Pulse 81   Temp (!) 97.4 F (36.3 C) (Temporal)   Resp 18   SpO2 96%   Physical Exam Vitals and nursing note reviewed.  Constitutional:      Appearance: Normal appearance.  HENT:     Head: Normocephalic and atraumatic.     Nose: Nose normal.     Mouth/Throat:     Mouth: Mucous membranes are moist.  Eyes:     Extraocular Movements: Extraocular movements intact.     Conjunctiva/sclera: Conjunctivae normal.  Cardiovascular:     Rate and Rhythm: Normal rate.  Pulmonary:     Effort: Pulmonary effort is normal.     Breath sounds: Normal breath sounds.  Chest:     Chest wall: No tenderness.  Abdominal:     General: Abdomen is flat.     Palpations: Abdomen is soft.     Tenderness: There is no abdominal tenderness.  Musculoskeletal:        General: No swelling. Normal  range of motion.     Cervical back: Neck supple.     Comments: Dialysis fistula in LUE with palpable thrill  Skin:    General: Skin is warm and dry.  Neurological:     General: No focal deficit present.     Mental Status: He is alert.  Psychiatric:        Mood and Affect: Mood normal.      ED Results / Procedures / Treatments   Labs (all labs ordered are listed, but only abnormal results are  displayed) Labs Reviewed  BASIC METABOLIC PANEL - Abnormal; Notable for the following components:      Result Value   Chloride 95 (*)    BUN 46 (*)    Creatinine, Ser 11.73 (*)    Calcium 8.4 (*)    GFR, Estimated 4 (*)    All other components within normal limits  CBC - Abnormal; Notable for the following components:   RBC 3.11 (*)    Hemoglobin 9.7 (*)    HCT 29.7 (*)    RDW 17.2 (*)    All other components within normal limits  TROPONIN I (HIGH SENSITIVITY) - Abnormal; Notable for the following components:   Troponin I (High Sensitivity) 126 (*)    All other components within normal limits  TROPONIN I (HIGH SENSITIVITY) - Abnormal; Notable for the following components:   Troponin I (High Sensitivity) 131 (*)    All other components within normal limits    EKG EKG Interpretation  Date/Time:  Saturday October 13 2020 01:31:12 EST Ventricular Rate:  79 PR Interval:  182 QRS Duration: 96 QT Interval:  410 QTC Calculation: 470 R Axis:   10 Text Interpretation: Sinus rhythm with occasional Premature ventricular complexes and Premature atrial complexes Left ventricular hypertrophy with repolarization abnormality ( Cornell product ) Abnormal ECG No significant change since last tracing Confirmed by Calvert Cantor [phone]) on 10/13/2020 4:15:03 AM   Radiology No results found.  Procedures Procedures  Medications Ordered in the ED Medications  nitroGLYCERIN (NITROSTAT) SL tablet 0.4 mg (0.4 mg Sublingual Given 10/13/20 0545)  fentaNYL (SUBLIMAZE) injection 50 mcg (50 mcg  Intravenous Given 10/13/20 0434)     MDM Rules/Calculators/A&P MDM Patient's EKG is unchanged, initial Trop is moderately elevated, down from recent admission but above his previous baseline. Will give NTG and fentanyl for pain, check delta Trop and reassess. He has been told at previous admissions that Cardiology has no further interventional options available and his symptoms will have to be managed medically. He has seen Palliative care recently and indicated then he wanted to continue with his treatments, including dialysis.    ED Course  I have reviewed the triage vital signs and the nursing notes.  Pertinent labs & imaging results that were available during my care of the patient were reviewed by me and considered in my medical decision making (see chart for details).  Clinical Course as of 10/13/20 0701  Sat Oct 13, 2020  0554 Repeat trop is not significantly changed from initial. This may be his new baseline.  [CS]  0631 Patient resting comfortably on reassessment. Reports improvement in pain. No indication for re-admission today. Advised to go to dialysis this morning as scheduled. Follow up with Cardiology and PCP for continued management of his stable angina. RTED for any other concerns.  [CS]  [id number] Patient now requesting a refill of NTG. Sent to his pharmacy.  [CS]    Clinical Course User Index [CS] Truddie Hidden, MD    Final Clinical Impression(s) / ED Diagnoses Final diagnoses:  Stable angina [facility])    Rx / DC Orders ED Discharge Orders         Ordered    nitroGLYCERIN (NITROSTAT) 0.4 MG SL tablet  Every 5 min PRN        10/13/20 0649           Truddie Hidden, MD 10/13/20 [phone]

## 2020-10-13 NOTE — ED Triage Notes (Signed)
Pt presents to ED BIB from home. Pt reports CP everyday, worsens when eating and laying down. Pt dialysis pt, compliant w/ treatments t/th/sat  EMS VS -  190/102

## 2020-10-16 ENCOUNTER — Emergency Department (HOSPITAL_COMMUNITY): Payer: Medicare Other

## 2020-10-16 ENCOUNTER — Encounter (HOSPITAL_COMMUNITY): Payer: Self-pay | Admitting: Emergency Medicine

## 2020-10-16 ENCOUNTER — Inpatient Hospital Stay (HOSPITAL_COMMUNITY): Payer: Medicare Other

## 2020-10-16 ENCOUNTER — Inpatient Hospital Stay (HOSPITAL_COMMUNITY)
Admission: EM | Admit: 2020-10-16 | Discharge: 2020-10-20 | DRG: 302 | Disposition: A | Discharge status: Home / Self Care | Payer: Medicare Other | Attending: Internal Medicine | Admitting: Internal Medicine

## 2020-10-16 ENCOUNTER — Other Ambulatory Visit: Payer: Self-pay

## 2020-10-16 DIAGNOSIS — F1010 Alcohol abuse, uncomplicated: Secondary | ICD-10-CM | POA: Diagnosis present

## 2020-10-16 DIAGNOSIS — I160 Hypertensive urgency: Secondary | ICD-10-CM | POA: Diagnosis present

## 2020-10-16 DIAGNOSIS — I248 Other forms of acute ischemic heart disease: Secondary | ICD-10-CM | POA: Diagnosis present

## 2020-10-16 DIAGNOSIS — N185 Chronic kidney disease, stage 5: Secondary | ICD-10-CM | POA: Diagnosis not present

## 2020-10-16 DIAGNOSIS — Z20822 Contact with and (suspected) exposure to covid-19: Secondary | ICD-10-CM | POA: Diagnosis present

## 2020-10-16 DIAGNOSIS — I2510 Atherosclerotic heart disease of native coronary artery without angina pectoris: Secondary | ICD-10-CM | POA: Diagnosis not present

## 2020-10-16 DIAGNOSIS — N186 End stage renal disease: Secondary | ICD-10-CM

## 2020-10-16 DIAGNOSIS — I25118 Atherosclerotic heart disease of native coronary artery with other forms of angina pectoris: Principal | ICD-10-CM | POA: Diagnosis present

## 2020-10-16 DIAGNOSIS — I5042 Chronic combined systolic (congestive) and diastolic (congestive) heart failure: Secondary | ICD-10-CM | POA: Diagnosis not present

## 2020-10-16 DIAGNOSIS — Z7982 Long term (current) use of aspirin: Secondary | ICD-10-CM | POA: Diagnosis not present

## 2020-10-16 DIAGNOSIS — E785 Hyperlipidemia, unspecified: Secondary | ICD-10-CM | POA: Diagnosis present

## 2020-10-16 DIAGNOSIS — Z8674 Personal history of sudden cardiac arrest: Secondary | ICD-10-CM | POA: Diagnosis not present

## 2020-10-16 DIAGNOSIS — I672 Cerebral atherosclerosis: Secondary | ICD-10-CM | POA: Diagnosis not present

## 2020-10-16 DIAGNOSIS — R778 Other specified abnormalities of plasma proteins: Secondary | ICD-10-CM

## 2020-10-16 DIAGNOSIS — R062 Wheezing: Secondary | ICD-10-CM | POA: Diagnosis not present

## 2020-10-16 DIAGNOSIS — R0602 Shortness of breath: Secondary | ICD-10-CM

## 2020-10-16 DIAGNOSIS — I10 Essential (primary) hypertension: Secondary | ICD-10-CM | POA: Diagnosis not present

## 2020-10-16 DIAGNOSIS — R4182 Altered mental status, unspecified: Secondary | ICD-10-CM | POA: Diagnosis not present

## 2020-10-16 DIAGNOSIS — I4819 Other persistent atrial fibrillation: Secondary | ICD-10-CM | POA: Diagnosis not present

## 2020-10-16 DIAGNOSIS — I120 Hypertensive chronic kidney disease with stage 5 chronic kidney disease or end stage renal disease: Secondary | ICD-10-CM | POA: Diagnosis not present

## 2020-10-16 DIAGNOSIS — I208 Other forms of angina pectoris: Secondary | ICD-10-CM | POA: Diagnosis present

## 2020-10-16 DIAGNOSIS — Z9119 Patient's noncompliance with other medical treatment and regimen: Secondary | ICD-10-CM | POA: Diagnosis not present

## 2020-10-16 DIAGNOSIS — E8889 Other specified metabolic disorders: Secondary | ICD-10-CM | POA: Diagnosis present

## 2020-10-16 DIAGNOSIS — R7989 Other specified abnormal findings of blood chemistry: Secondary | ICD-10-CM | POA: Insufficient documentation

## 2020-10-16 DIAGNOSIS — Z9114 Patient's other noncompliance with medication regimen: Secondary | ICD-10-CM | POA: Diagnosis not present

## 2020-10-16 DIAGNOSIS — I252 Old myocardial infarction: Secondary | ICD-10-CM | POA: Diagnosis not present

## 2020-10-16 DIAGNOSIS — Z79899 Other long term (current) drug therapy: Secondary | ICD-10-CM | POA: Diagnosis not present

## 2020-10-16 DIAGNOSIS — R079 Chest pain, unspecified: Secondary | ICD-10-CM

## 2020-10-16 DIAGNOSIS — R0789 Other chest pain: Secondary | ICD-10-CM | POA: Diagnosis not present

## 2020-10-16 DIAGNOSIS — R0902 Hypoxemia: Secondary | ICD-10-CM | POA: Diagnosis not present

## 2020-10-16 DIAGNOSIS — I5020 Unspecified systolic (congestive) heart failure: Secondary | ICD-10-CM | POA: Diagnosis not present

## 2020-10-16 DIAGNOSIS — D631 Anemia in chronic kidney disease: Secondary | ICD-10-CM | POA: Diagnosis present

## 2020-10-16 DIAGNOSIS — R000 Tachycardia, unspecified: Secondary | ICD-10-CM | POA: Diagnosis not present

## 2020-10-16 DIAGNOSIS — D649 Anemia, unspecified: Secondary | ICD-10-CM | POA: Diagnosis not present

## 2020-10-16 DIAGNOSIS — Z992 Dependence on renal dialysis: Secondary | ICD-10-CM

## 2020-10-16 DIAGNOSIS — I6523 Occlusion and stenosis of bilateral carotid arteries: Secondary | ICD-10-CM | POA: Diagnosis not present

## 2020-10-16 DIAGNOSIS — I132 Hypertensive heart and chronic kidney disease with heart failure and with stage 5 chronic kidney disease, or end stage renal disease: Secondary | ICD-10-CM | POA: Diagnosis not present

## 2020-10-16 DIAGNOSIS — N2581 Secondary hyperparathyroidism of renal origin: Secondary | ICD-10-CM | POA: Diagnosis not present

## 2020-10-16 DIAGNOSIS — M549 Dorsalgia, unspecified: Secondary | ICD-10-CM | POA: Diagnosis not present

## 2020-10-16 DIAGNOSIS — N250 Renal osteodystrophy: Secondary | ICD-10-CM | POA: Diagnosis not present

## 2020-10-16 DIAGNOSIS — I25119 Atherosclerotic heart disease of native coronary artery with unspecified angina pectoris: Secondary | ICD-10-CM | POA: Diagnosis not present

## 2020-10-16 DIAGNOSIS — R748 Abnormal levels of other serum enzymes: Secondary | ICD-10-CM | POA: Diagnosis not present

## 2020-10-16 DIAGNOSIS — G9389 Other specified disorders of brain: Secondary | ICD-10-CM | POA: Diagnosis not present

## 2020-10-16 DIAGNOSIS — Z825 Family history of asthma and other chronic lower respiratory diseases: Secondary | ICD-10-CM | POA: Diagnosis not present

## 2020-10-16 DIAGNOSIS — Z87891 Personal history of nicotine dependence: Secondary | ICD-10-CM | POA: Diagnosis not present

## 2020-10-16 DIAGNOSIS — I4891 Unspecified atrial fibrillation: Secondary | ICD-10-CM | POA: Diagnosis not present

## 2020-10-16 LAB — CBC WITH DIFFERENTIAL/PLATELET
Abs Immature Granulocytes: 0.03 10*3/uL (ref 0.00–0.07)
Basophils Absolute: 0.1 10*3/uL (ref 0.0–0.1)
Basophils Relative: 1 %
Eosinophils Absolute: 0.6 10*3/uL — ABNORMAL HIGH (ref 0.0–0.5)
Eosinophils Relative: 8 %
HCT: 31.8 % — ABNORMAL LOW (ref 39.0–52.0)
Hemoglobin: 10.2 g/dL — ABNORMAL LOW (ref 13.0–17.0)
Immature Granulocytes: 0 %
Lymphocytes Relative: 18 %
Lymphs Abs: 1.4 10*3/uL (ref 0.7–4.0)
MCH: 31.2 pg (ref 26.0–34.0)
MCHC: 32.1 g/dL (ref 30.0–36.0)
MCV: 97.2 fL (ref 80.0–100.0)
Monocytes Absolute: 0.5 10*3/uL (ref 0.1–1.0)
Monocytes Relative: 7 %
Neutro Abs: 4.9 10*3/uL (ref 1.7–7.7)
Neutrophils Relative %: 66 %
Platelets: 231 10*3/uL (ref 150–400)
RBC: 3.27 MIL/uL — ABNORMAL LOW (ref 4.22–5.81)
RDW: 17 % — ABNORMAL HIGH (ref 11.5–15.5)
WBC: 7.4 10*3/uL (ref 4.0–10.5)
nRBC: 0 % (ref 0.0–0.2)

## 2020-10-16 LAB — COMPREHENSIVE METABOLIC PANEL
ALT: 10 U/L (ref 0–44)
AST: 13 U/L — ABNORMAL LOW (ref 15–41)
Albumin: 3.2 g/dL — ABNORMAL LOW (ref 3.5–5.0)
Alkaline Phosphatase: 62 U/L (ref 38–126)
Anion gap: 18 — ABNORMAL HIGH (ref 5–15)
BUN: 58 mg/dL — ABNORMAL HIGH (ref 8–23)
CO2: 27 mmol/L (ref 22–32)
Calcium: 9.2 mg/dL (ref 8.9–10.3)
Chloride: 96 mmol/L — ABNORMAL LOW (ref 98–111)
Creatinine, Ser: 11.92 mg/dL — ABNORMAL HIGH (ref 0.61–1.24)
GFR, Estimated: 4 mL/min — ABNORMAL LOW (ref >60)
Glucose, Bld: 130 mg/dL — ABNORMAL HIGH (ref 70–99)
Potassium: 4.4 mmol/L (ref 3.5–5.1)
Sodium: 141 mmol/L (ref 135–145)
Total Bilirubin: 0.8 mg/dL (ref 0.3–1.2)
Total Protein: 7.9 g/dL (ref 6.5–8.1)

## 2020-10-16 LAB — RESP PANEL BY RT-PCR (FLU A&B, COVID) ARPGX2
Influenza A by PCR: NEGATIVE
Influenza B by PCR: NEGATIVE
SARS Coronavirus 2 by RT PCR: NEGATIVE

## 2020-10-16 LAB — TROPONIN I (HIGH SENSITIVITY)
Troponin I (High Sensitivity): 161 ng/L (ref <18)
Troponin I (High Sensitivity): 173 ng/L (ref <18)

## 2020-10-16 MED ORDER — CLOPIDOGREL BISULFATE 75 MG PO TABS
75.0000 mg | ORAL_TABLET | Freq: Every day | ORAL | Status: DC
  Administered 2020-10-16 – 2020-10-20 (×5): 75 mg via ORAL
  Filled 2020-10-16 (×5): qty 1

## 2020-10-16 MED ORDER — RENA-VITE PO TABS
1.0000 | ORAL_TABLET | Freq: Every day | ORAL | Status: DC
  Administered 2020-10-16 – 2020-10-19 (×4): 1 via ORAL
  Filled 2020-10-16 (×4): qty 1

## 2020-10-16 MED ORDER — HEPARIN SODIUM (PORCINE) 1000 UNIT/ML IJ SOLN
INTRAMUSCULAR | Status: AC
  Administered 2020-10-17: 2400 [IU] via INTRAVENOUS_CENTRAL
  Filled 2020-10-16: qty 3

## 2020-10-16 MED ORDER — METOPROLOL SUCCINATE ER 50 MG PO TB24
50.0000 mg | ORAL_TABLET | Freq: Every day | ORAL | Status: DC
  Administered 2020-10-16 – 2020-10-18 (×3): 50 mg via ORAL
  Filled 2020-10-16 (×3): qty 1

## 2020-10-16 MED ORDER — ISOSORBIDE MONONITRATE ER 60 MG PO TB24
60.0000 mg | ORAL_TABLET | Freq: Every day | ORAL | Status: DC
  Administered 2020-10-16 – 2020-10-20 (×5): 60 mg via ORAL
  Filled 2020-10-16 (×5): qty 1

## 2020-10-16 MED ORDER — MORPHINE SULFATE (PF) 4 MG/ML IV SOLN
4.0000 mg | Freq: Once | INTRAVENOUS | Status: AC
  Administered 2020-10-16: 16:00:00 4 mg via INTRAVENOUS
  Filled 2020-10-16: qty 1

## 2020-10-16 MED ORDER — ONDANSETRON HCL 4 MG PO TABS: 4.0000 mg | ORAL_TABLET | Freq: Four times a day (QID) | ORAL | Status: DC | PRN

## 2020-10-16 MED ORDER — HEPARIN SODIUM (PORCINE) 5000 UNIT/ML IJ SOLN
5000.0000 [IU] | Freq: Two times a day (BID) | INTRAMUSCULAR | Status: DC
  Administered 2020-10-16 – 2020-10-20 (×8): 5000 [IU] via SUBCUTANEOUS
  Filled 2020-10-16 (×8): qty 1

## 2020-10-16 MED ORDER — SODIUM CHLORIDE 0.9% FLUSH: 3.0000 mL | INTRAVENOUS | Status: DC | PRN

## 2020-10-16 MED ORDER — HYDROMORPHONE HCL 1 MG/ML IJ SOLN
0.5000 mg | INTRAMUSCULAR | Status: DC | PRN
  Administered 2020-10-16 – 2020-10-18 (×2): 1 mg via INTRAVENOUS
  Administered 2020-10-19: 0.5 mg via INTRAVENOUS
  Filled 2020-10-16 (×2): qty 1
  Filled 2020-10-16: qty 0.5

## 2020-10-16 MED ORDER — NITROGLYCERIN IN D5W 200-5 MCG/ML-% IV SOLN
0.0000 ug/min | INTRAVENOUS | Status: DC
  Administered 2020-10-16: 16:00:00 10 ug/min via INTRAVENOUS
  Filled 2020-10-16: qty 250

## 2020-10-16 MED ORDER — ASPIRIN 81 MG PO CHEW
324.0000 mg | CHEWABLE_TABLET | Freq: Once | ORAL | Status: AC
  Administered 2020-10-16: 09:00:00 324 mg via ORAL
  Filled 2020-10-16: qty 4

## 2020-10-16 MED ORDER — MORPHINE SULFATE (PF) 4 MG/ML IV SOLN: 4.0000 mg | Freq: Once | INTRAVENOUS | Status: DC

## 2020-10-16 MED ORDER — LABETALOL HCL 5 MG/ML IV SOLN: 10.0000 mg | INTRAVENOUS | Status: DC | PRN

## 2020-10-16 MED ORDER — HEPARIN SODIUM (PORCINE) 1000 UNIT/ML DIALYSIS: 2400.0000 [IU] | Freq: Once | INTRAMUSCULAR | Status: AC

## 2020-10-16 MED ORDER — HEPARIN SODIUM (PORCINE) 1000 UNIT/ML DIALYSIS: 1000.0000 [IU] | INTRAMUSCULAR | Status: DC | PRN

## 2020-10-16 MED ORDER — ACETAMINOPHEN 325 MG PO TABS
650.0000 mg | ORAL_TABLET | Freq: Four times a day (QID) | ORAL | Status: DC | PRN
  Administered 2020-10-16 – 2020-10-19 (×3): 650 mg via ORAL
  Filled 2020-10-16 (×3): qty 2

## 2020-10-16 MED ORDER — ATORVASTATIN CALCIUM 80 MG PO TABS
80.0000 mg | ORAL_TABLET | Freq: Every day | ORAL | Status: DC
  Administered 2020-10-16 – 2020-10-20 (×5): 80 mg via ORAL
  Filled 2020-10-16 (×5): qty 1

## 2020-10-16 MED ORDER — PENTAFLUOROPROP-TETRAFLUOROETH EX AERO: 1.0000 "application " | INHALATION_SPRAY | CUTANEOUS | Status: DC | PRN

## 2020-10-16 MED ORDER — ONDANSETRON HCL 4 MG/2ML IJ SOLN: 4.0000 mg | Freq: Four times a day (QID) | INTRAMUSCULAR | Status: DC | PRN

## 2020-10-16 MED ORDER — NITROGLYCERIN 0.4 MG SL SUBL
0.4000 mg | SUBLINGUAL_TABLET | SUBLINGUAL | Status: AC | PRN
  Administered 2020-10-16 (×3): 0.4 mg via SUBLINGUAL
  Filled 2020-10-16: qty 1

## 2020-10-16 MED ORDER — AMLODIPINE BESYLATE 10 MG PO TABS
10.0000 mg | ORAL_TABLET | Freq: Every day | ORAL | Status: DC
  Administered 2020-10-16 – 2020-10-19 (×4): 10 mg via ORAL
  Filled 2020-10-16 (×4): qty 1

## 2020-10-16 MED ORDER — ASPIRIN EC 81 MG PO TBEC
81.0000 mg | DELAYED_RELEASE_TABLET | Freq: Every day | ORAL | Status: DC
  Administered 2020-10-16 – 2020-10-20 (×5): 81 mg via ORAL
  Filled 2020-10-16 (×5): qty 1

## 2020-10-16 MED ORDER — SODIUM CHLORIDE 0.9 % IV SOLN: 100.0000 mL | INTRAVENOUS | Status: DC | PRN

## 2020-10-16 MED ORDER — MORPHINE SULFATE (PF) 4 MG/ML IV SOLN
4.0000 mg | Freq: Once | INTRAVENOUS | Status: AC
  Administered 2020-10-16: 09:00:00 4 mg via INTRAVENOUS
  Filled 2020-10-16: qty 1

## 2020-10-16 MED ORDER — SODIUM CHLORIDE 0.9% FLUSH
3.0000 mL | Freq: Two times a day (BID) | INTRAVENOUS | Status: DC
  Administered 2020-10-16 – 2020-10-20 (×8): 3 mL via INTRAVENOUS

## 2020-10-16 MED ORDER — DIPHENHYDRAMINE-APAP (SLEEP) 25-500 MG PO TABS: 2.0000 | ORAL_TABLET | Freq: Every evening | ORAL | Status: DC | PRN

## 2020-10-16 MED ORDER — LIDOCAINE-PRILOCAINE 2.5-2.5 % EX CREA: 1.0000 "application " | TOPICAL_CREAM | CUTANEOUS | Status: DC | PRN

## 2020-10-16 MED ORDER — LIDOCAINE HCL (PF) 1 % IJ SOLN: 5.0000 mL | INTRAMUSCULAR | Status: DC | PRN

## 2020-10-16 MED ORDER — LABETALOL HCL 5 MG/ML IV SOLN
10.0000 mg | INTRAVENOUS | Status: DC | PRN
Start: 2020-10-16 — End: 2020-10-18
  Administered 2020-10-16: 16:00:00 10 mg via INTRAVENOUS
  Filled 2020-10-16: qty 4

## 2020-10-16 MED ORDER — CHLORHEXIDINE GLUCONATE CLOTH 2 % EX PADS
6.0000 | MEDICATED_PAD | Freq: Every day | CUTANEOUS | Status: DC
  Administered 2020-10-17 – 2020-10-19 (×3): 6 via TOPICAL

## 2020-10-16 MED ORDER — ACETAMINOPHEN 650 MG RE SUPP: 650.0000 mg | Freq: Four times a day (QID) | RECTAL | Status: DC | PRN

## 2020-10-16 MED ORDER — SODIUM CHLORIDE 0.9 % IV SOLN: 250.0000 mL | INTRAVENOUS | Status: DC | PRN

## 2020-10-16 NOTE — Plan of Care (Signed)

## 2020-10-16 NOTE — Progress Notes (Signed)
Patient came back from CT head scan and Xray without any incident. He has been alert all times. CP is still 3/10. Will continue to assess.

## 2020-10-16 NOTE — Progress Notes (Signed)
Attempted to call the Pt's daughter back to give her an update on the Pt's admission and status.Marland Kitchen went to voice mail.

## 2020-10-16 NOTE — Progress Notes (Signed)
   10/16/20 1830  Assess: MEWS Score  BP (!) 156/86  Pulse Rate 93  ECG Heart Rate 95  Resp (!) 22  Assess: MEWS Score  MEWS Temp 0  MEWS Systolic 0  MEWS Pulse 0  MEWS RR 1  MEWS LOC 0  MEWS Score 1  MEWS Score Color Green  Notify: Provider  Provider Name/Title [facility] PA  Notify: Rapid Response  Name of Rapid Response RN Notified [place] RN  Date Rapid Response Notified 10/16/20  Time Rapid Response Notified 3094  MD at the bedside, Pt is experiencing alerted mental status change, I had given him dilaudid 1mg  for pain around 1650 PM, but was doing ok, had fallen asleep on side of bed and then layed down in bed, he was arousable to answer questions on pain.. But when MD when in was confused, unable to answer some questions,  Rapid was called to come and assist the Pt as well

## 2020-10-16 NOTE — H&P (Addendum)
[facility] History & Physical  Rob Doctor, hospital MD  [name] 10/16/2020  Chief Complaint: Chest pain HPI: The patient is a 64 y.o. year-old w/ hx of MI, HTN, PEA arrest (2019), hx etoh abuse, ESRD on HD, chronic combined syst/ diast CHF and ESRD on HD TTS.  Pt arrived to ED via EMS from home reporting SOB/ wheezing and chest pain. He was given Solumedrol IV and Duoneb. No N/V or diaphoresis. Pt was admitted for earlier this month for NSTEMI.  He has known severe 3VCAD and was not felt to be a candidate for PCI per notes from cardiology July 2021 (see below) and not a CABG candidate per notes from 2019 (see below). Pt is on medical management. Troponin's here are 126 - 131 - 161 - 173. Pt rec'd 4mg  MSOr, asa 324mg  in ED. COVID pending. This is 4th admission for chest pain in the last month, prior to that was July 2021 (last heart cath).  Asked to see for admission.   Pt seen in ED.  Still having some mild L chest and L shoulder pain in the ED.  States he has been having a lot of chest pain / arm/ shoulder pain. Dialysis does not worsen chest pain, but usually makes it better.  Denies any back pain, prod cough, hemoptysis, abd pain or n/v/d.  No fevers. Good compliance w/ HD per the patient. Last HD Sat 12/18.     Nov  2019 >> TCTS, Dr Prescott Gum Patient examined, images of most recent cath and echocardiogram personally reviewed.  Patient with recent non-STEMI found to have significant three-vessel CAD and evaluation for CABG recommended.  The patient's LV systolic function currently is only mildly reduced--in the past he has had probably alcohol induced nonischemic cardiomyopathy.  Patient has adequate targets for grafting and adequate LV function.  However his comorbid medical problems would preclude benefit from sternotomy and cardiopulmonary bypass.  Patient has renal failure on dialysis, history of multiple bilateral cerebral infarcts with advanced cerebral atrophy on most recent CT  scan, patient has been a heavy smoker and has very poor mechanics of breathing and would poorly tolerate sternotomy.  Although surgical revascularization would improve his cardiac status the operation would result in further severe problems with multiorgan failure-dysfunction.  Recommend consideration of PCI.  I discussed this plan with the patient.    July 2021 LHC on 05/14/20 > Conclusion: per cardiology 1. Severe multivessel coronary artery disease with severe calcific distal left main stenosis, severe diffuse ostial/proximal LAD stenosis, severe stenosis of the proximal left circumflex/first OM, and interval occlusion of the distal RCA with left-to-right collaterals 2. Heavily calcified distal abdominal aorta and bilateral iliac arteries with mild diffuse calcified disease. No severe iliac lesions identified. Recommend ongoing medical therapy. With total occlusion of the RCA and severe calcification of the distal left main and complex disease in the LAD and left circumflex, I think risk of protected PCI would be extremely high. I also think his access is marginal for large bore support devices from the groin.   ROS  no joint pain   no HA  no blurry vision  no rash  no diarrhea  no nausea/ vomiting   Past Medical History  Past Medical History:  Diagnosis Date  . Acute CHF ([place]) 01/2018  . Cardiomyopathy secondary    likely related to HTN heart disease; possibly ETOH related as well  . Chronic combined systolic and diastolic heart failure ([place])    Echocardiogram 09/22/11:  Moderate LVH, EF 54-00%, grade 3 diastolic dysfunction, mild MR, moderate to severe LAE, mild RVE, mild to moderate TR, small to moderate pericardial effusion  . Coronary artery disease    not felt to be candidate for CABG 08/2018; medical thearpy recomended due to high risk of PCI   . Dysrhythmia    aflutter 04/2016, afib 02/2018, not felt to be a candidate for anticoagulation due to non-compliance and ETOH  .  ESRD (end stage renal disease) on dialysis ([place])    due to hypertensive nephrosclerosis; TTS; [address] (09/01/2018)  . History of alcohol abuse   . Hypertension   . Myocardial infarction [facility])    " mild " per daughter  . PEA (Pulseless electrical activity) ([place])    PEA arrest 03/09/18, treated empirically for hyperkalemia, shock x1 for WCT, given amiodarone, ROSC after 10 min of ACLS   Past Surgical History  Past Surgical History:  Procedure Laterality Date  . AV FISTULA PLACEMENT Left 05/14/2016   Procedure: LEFT ARM BASILIC VEIN TRANSPOSITION;  Surgeon: Rosetta Posner, MD;  Location: [place];  Service: Vascular;  Laterality: Left;  . INSERTION OF DIALYSIS CATHETER N/A 06/20/2019   Procedure: INSERTION OF TUNNELED  DIALYSIS CATHETER;  Surgeon: Elam Dutch, MD;  Location: [place];  Service: Vascular;  Laterality: N/A;  . LEFT HEART CATH AND CORONARY ANGIOGRAPHY N/A 09/02/2018   Procedure: LEFT HEART CATH AND CORONARY ANGIOGRAPHY;  Surgeon: Nelva Bush, MD;  Location: [place] [facility];  Service: Cardiovascular;  Laterality: N/A;  . LEFT HEART CATH AND CORONARY ANGIOGRAPHY N/A 05/14/2020   Procedure: LEFT HEART CATH AND CORONARY ANGIOGRAPHY;  Surgeon: Sherren Mocha, MD;  Location: [place] [facility];  Service: Cardiovascular;  Laterality: N/A;  . PERIPHERAL VASCULAR CATHETERIZATION N/A 05/13/2016   Procedure: Dialysis/Perma Catheter Insertion;  Surgeon: Serafina Mitchell, MD;  Location: [place] [facility];  Service: Cardiovascular;  Laterality: N/A;  . REVISION OF ARTERIOVENOUS GORETEX GRAFT Left 8/67/6195   Procedure: PLICATION OF ARTERIOVENOUS FISTULA LEFT ARM;  Surgeon: Elam Dutch, MD;  Location[facility];  Service: Vascular;  Laterality: Left;   Family History  Family History  Problem Relation Age of Onset  . Emphysema Mother   . Cirrhosis Father    Social History  reports that he quit smoking about 2 years ago. His smoking use included cigars. He has a 96.00 pack-year smoking  history. He has quit using smokeless tobacco.  His smokeless tobacco use included chew. He reports previous alcohol use. He reports that he does not use drugs. Allergies No Known Allergies Home medications Prior to Admission medications   Medication Sig Start Date End Date Taking? Authorizing Provider  amLODipine (NORVASC) 10 MG tablet Take 1 tablet (10 mg total) by mouth daily. 10/09/20   Erlene Quan, PA-C  aspirin EC 81 MG EC tablet Take 1 tablet (81 mg total) by mouth daily. Swallow whole. 05/16/20   Alma Friendly, MD  atorvastatin (LIPITOR) 80 MG tablet Take 1 tablet (80 mg total) by mouth daily. 10/09/20   Erlene Quan, PA-C  clopidogrel (PLAVIX) 75 MG tablet Take 1 tablet (75 mg total) by mouth daily. 10/09/20   Kilroy, Doreene Burke, PA-C  diphenhydramine-acetaminophen (TYLENOL PM EXTRA STRENGTH) 25-500 MG TABS tablet Take 2 tablets by mouth at bedtime as needed (Pain, Sleep).  05/12/18   [provider]  isosorbide mononitrate (IMDUR) 30 MG 24 hr tablet Take 3 tablets (90 mg total) by mouth daily. 10/09/20  Kerin Ransom K, PA-C  metoprolol succinate (TOPROL-XL) 25 MG 24 hr tablet Take 1 tablet (25 mg total) by mouth daily. 10/09/20   Erlene Quan, PA-C  nitroGLYCERIN (NITROSTAT) 0.4 MG SL tablet Place 1 tablet (0.4 mg total) under the tongue every 5 (five) minutes as needed for chest pain (max 3 doses). 10/13/20   Truddie Hidden, MD       Exam Gen alet, 2L [place], not in distress No rash, cyanosis or gangrene Sclera anicteric, throat clear  No jvd or bruits Chest clear bilat to bases RRR no MRG Abd soft ntnd no mass or ascites +bs GU normal male MS no joint effusions or deformity Ext no leg or UE edema, no wounds or ulcers Neuro is alert, Ox 3 , nf LUA aVF +bruit    Home meds:  - norvasc 10/ imdur 60 qd/ toprol xl 25 qd  - sl ntg prn/ plavix 75/ asa 81 mg  - prn's/ vitamins/ supplements     Na 141 K 4.4 BUN 58  Cr 11.9    Alb 3.2  WBC 7K  Hb 10      Assessment/ Plan: 1. Chest pain - pt has known severe 3VCAD including L main disease, f/b cardiology. Not PCI or CABG candidate, on medical rx. Having "lots" of chest pain, gets better w/ HD usually. Trop 100's, stable here. EKG mild lateral ST depression, not new. Cont meds BB, imdur, plavix, asa. IV NTG ordered since CP worsening, repeat MSO4. IV labetalol x 1. Cards consulted.  2. ESRD - on HD TTS. Last HD Sat per patient.  No sig vol overload, CXR clear.  Will consult nephrology.  3. HTN - cont BB, norvasc 4. H/o PEA arrest - in 2019 5. H/o combined syst/ diast CHF - most recent LVEF was better at 55% 6. Prognosis - guarded. Had brief [place] discussion, pt would like full code.       Kelly Splinter  MD 10/16/2020, 1:57 PM

## 2020-10-16 NOTE — ED Notes (Signed)
Cards at bedside told RN to titrate Nitro for better pressure control, CP is 0 at this time.

## 2020-10-16 NOTE — Consult Note (Addendum)
Cardiology Consultation:   Patient ID: [name] MRN: [id number]; DOB: 18-Mar-1956  Admit date: 10/16/2020 Date of Consult: 10/16/2020  Primary Care Provider: [name], MD [facility] Cardiologist: Peter [name], MD  [facility] Electrophysiologist:  None    Patient Profile:   Kenneth [name] is a 64 y.o. male with a PMH of severe multivessel CAD not a candidate for CABG, chronic combined CHF with recovery of EF, paroxysmal atrial fibrillation not a candidate for anticoagulation due to non-compliance, HTN, HLD, ESRD on HD, who is being seen today for the evaluation of chest pain at the request of Dr. Tomi Bamberger.  History of Present Illness:   Kenneth [name] has had multiple admissions for recurrent chest pain, usually in the setting of medication non-compliance or missed dialysis sessions. He presented to the ED 10/16/20 with complaints of SOB, wheezing, and chest pain. He reported lots of chest, arm, and shoulder pain which was usually improved with dialysis. He reported compliance with his HD.   He was last evaluated by cardiology at an outpatient visit with Kerin Ransom, PA-C 10/09/20 for post-hospital follow-up after an admission 10/02/20 for NSTEMI with HsTrop peak of 4400. Elevated troponins were felt to be demand ischemia in the setting of medication non-compliance and missed HD. After discussion between Dr. [name] and review of cath films by Dr. Angelena Form, he was not felt to be a candidate for bypass surgery or PCI. Palliative care consult was recommended at that time and decision made for patient to be DNI. At his follow-up visit he was again non-compliant with his medications. Refills given and he was recommended to follow-up in 6 months. His last ischemic evaluation was a LHC 04/2020 showed severe multivessel CAD with 70% distal LM, 90% pLAD, 25% mLAD, 85% LCx, 75% OM1, 75% mRCA, and 100% dRCA with left to right collaterals, felt to be a poor candidate for PCI. His last  echocardiogram 04/2020 showed EF 55-60%, moderate LVH, no RWMA, G1DD, normal RV systolic function/size, mild LAE, and moderate MR.   Hospital course: BP significantly elevated, intermittently tachypneic and tachycardic, O2 down to 80s with improvement on O2 via [place], afebrile. Labs notable for electrolytes wnl, Cr 11.9, Hgb 10.2, PLT 231, HsTrop 161>173. EKG with sinus rhythm, rate 94 bpm, non-specific ST -T wave abnormalities unchanged from previous. CXR without acute findings. Patient was admitted to medicine. Home meds continued. Cardiology asked to evaluate.   When I first walk in the room, patient was laying in bed. He had just woken up. He was zoned out and would not answer all of my questions. He would take long pauses before giving me short answers and I would have to repeat the question multiple times. I tried to perform Neuro exam but he was not responding well to commands. This seemed to be an change in how he was acting previously per RN. Rapid response wall called and STAT head CT was ordered to rule out stroke. However, patient's mental status then began to improve. He became more alert and orient and was able to answer questions and follow commands. No acute focal deficits noted on brief Neuro exam. Patient reports chest pain started today at rest. Not worse with exertion. Continue to have chest pain at the time of this evaluation ranking it as a 7/10. Does not appear to be in pain. He denies any associated shortness of breath but did report some associated nausea. He was laying almost completely flat in the bed and had some mild increased  work of breath with some pursed lip breathing but he denied feeling short of breath. No palpitations, lightheadedness, dizziness, syncope. No lower extremity edema. No recent fevers or illnesses. He states he has been compliant with his medications and dialysis sessions.   Past Medical History:  Diagnosis Date  . Acute CHF ([place]) 01/2018  . Cardiomyopathy  secondary    likely related to HTN heart disease; possibly ETOH related as well  . Chronic combined systolic and diastolic heart failure (HCC)    Echocardiogram 09/22/11: Moderate LVH, EF 78-46%, grade 3 diastolic dysfunction, mild MR, moderate to severe LAE, mild RVE, mild to moderate TR, small to moderate pericardial effusion  . Coronary artery disease    not felt to be candidate for CABG 08/2018; medical thearpy recomended due to high risk of PCI   . Dysrhythmia    aflutter 04/2016, afib 02/2018, not felt to be a candidate for anticoagulation due to non-compliance and ETOH  . ESRD (end stage renal disease) on dialysis ([place])    due to hypertensive nephrosclerosis; TTS; [address] (09/01/2018)  . History of alcohol abuse   . Hypertension   . Myocardial infarction [facility])    " mild " per daughter  . PEA (Pulseless electrical activity) ([place])    PEA arrest 03/09/18, treated empirically for hyperkalemia, shock x1 for WCT, given amiodarone, ROSC after 10 min of ACLS    Past Surgical History:  Procedure Laterality Date  . AV FISTULA PLACEMENT Left 05/14/2016   Procedure: LEFT ARM BASILIC VEIN TRANSPOSITION;  Surgeon: Rosetta Posner, MD;  Location: [place];  Service: Vascular;  Laterality: Left;  . INSERTION OF DIALYSIS CATHETER N/A 06/20/2019   Procedure: INSERTION OF TUNNELED  DIALYSIS CATHETER;  Surgeon: Elam Dutch, MD;  Location: [place];  Service: Vascular;  Laterality: N/A;  . LEFT HEART CATH AND CORONARY ANGIOGRAPHY N/A 09/02/2018   Procedure: LEFT HEART CATH AND CORONARY ANGIOGRAPHY;  Surgeon: Nelva Bush, MD;  Location: [place] CV LAB;  Service: Cardiovascular;  Laterality: N/A;  . LEFT HEART CATH AND CORONARY ANGIOGRAPHY N/A 05/14/2020   Procedure: LEFT HEART CATH AND CORONARY ANGIOGRAPHY;  Surgeon: Sherren Mocha, MD;  Location: [place] CV LAB;  Service: Cardiovascular;  Laterality: N/A;  . PERIPHERAL VASCULAR CATHETERIZATION N/A 05/13/2016   Procedure: Dialysis/Perma Catheter  Insertion;  Surgeon: Serafina Mitchell, MD;  Location: [place] CV LAB;  Service: Cardiovascular;  Laterality: N/A;  . REVISION OF ARTERIOVENOUS GORETEX GRAFT Left 9/62/9528   Procedure: PLICATION OF ARTERIOVENOUS FISTULA LEFT ARM;  Surgeon: Elam Dutch, MD;  Location: [facility];  Service: Vascular;  Laterality: Left;     Home Medications:  Prior to Admission medications   Medication Sig Start Date End Date Taking? Authorizing Provider  amLODipine (NORVASC) 10 MG tablet Take 1 tablet (10 mg total) by mouth daily. 10/09/20  Yes Erlene Quan, PA-C  aspirin EC 81 MG EC tablet Take 1 tablet (81 mg total) by mouth daily. Swallow whole. 05/16/20  Yes Alma Friendly, MD  atorvastatin (LIPITOR) 80 MG tablet Take 1 tablet (80 mg total) by mouth daily. 10/09/20  Yes Kilroy, Luke K, PA-C  B Complex-C-Folic Acid (RENA-VITE RX) 1 MG TABS Take 1 tablet by mouth daily. 10/05/20  Yes [provider]  clopidogrel (PLAVIX) 75 MG tablet Take 1 tablet (75 mg total) by mouth daily. 10/09/20  Yes Kilroy, Luke K, PA-C  diphenhydramine-acetaminophen (TYLENOL PM EXTRA STRENGTH) 25-500 MG TABS tablet Take 2 tablets by mouth at bedtime  as needed (pain). 05/12/18  Yes [provider]  isosorbide mononitrate (IMDUR) 30 MG 24 hr tablet Take 3 tablets (90 mg total) by mouth daily. Patient taking differently: Take 60 mg by mouth daily. 10/09/20  Yes Kilroy, Luke K, PA-C  metoprolol succinate (TOPROL-XL) 25 MG 24 hr tablet Take 1 tablet (25 mg total) by mouth daily. 10/09/20  Yes Kilroy, Luke K, PA-C  nitroGLYCERIN (NITROSTAT) 0.4 MG SL tablet Place 1 tablet (0.4 mg total) under the tongue every 5 (five) minutes as needed for chest pain (max 3 doses). 10/13/20  Yes Truddie Hidden, MD    Inpatient Medications: Scheduled Meds: . [START ON 10/17/2020] Chlorhexidine Gluconate Cloth  6 each Topical Q0600  . heparin  2,400 Units Dialysis Once in dialysis  . metoprolol succinate  50 mg Oral Daily    Continuous Infusions: . sodium chloride    . sodium chloride    . nitroGLYCERIN 30 mcg/min (10/16/20 1643)   PRN Meds: sodium chloride, sodium chloride, heparin, HYDROmorphone (DILAUDID) injection, labetalol, labetalol, lidocaine (PF), lidocaine-prilocaine, pentafluoroprop-tetrafluoroeth  Allergies:   No Known Allergies  Social History:   Social History   Socioeconomic History  . Marital status: Divorced    Spouse name: Not on file  . Number of children: Not on file  . Years of education: Not on file  . Highest education level: Not on file  Occupational History  . Occupation: retired  Tobacco Use  . Smoking status: Former Smoker    Packs/day: 2.00    Years: 48.00    Pack years: 96.00    Types: Cigars    Quit date: 10/27/2017    Years since quitting: 2.9  . Smokeless tobacco: Former Systems developer    Types: Secondary school teacher  . Vaping Use: Never used  Substance and Sexual Activity  . Alcohol use: Not Currently    Comment: h/o very heavy use, quit about 2019  . Drug use: No  . Sexual activity: Not Currently  Other Topics Concern  . Not on file  Social History Narrative  . Not on file   Social Determinants of Health   Financial Resource Strain: Not on file  Food Insecurity: Not on file  Transportation Needs: Not on file  Physical Activity: Not on file  Stress: Not on file  Social Connections: Not on file  Intimate Partner Violence: Not on file    Family History:    Family History  Problem Relation Age of Onset  . Emphysema Mother   . Cirrhosis Father      ROS:  Please see the history of present illness.   All other ROS reviewed and negative.     Physical Exam/Data:   Vitals:   10/16/20 1600 10/16/20 1615 10/16/20 1616 10/16/20 1617  BP: (!) 199/108 (!) 121/101 (!) 160/88   Pulse: 84 81 82 78  Resp: (!) 24 20 18  (!) 21  Temp:   97.6 F (36.4 C)   TempSrc:      SpO2: 92% 92%  93%  Weight:      Height:       No intake or output data in the 24 hours  ending 10/16/20 1702 Last 3 Weights 10/16/2020 10/09/2020 10/04/2020  Weight (lbs) 220 lb 7.4 oz 196 lb 193 lb 9 oz  Weight (kg) 100 kg 88.905 kg 87.8 kg     Body mass index is 31.63 kg/m.  General: 64 y.o. male resting comfortably in no acute distress. HEENT: Normocephalic and atraumatic. Sclera clear.  EOMs intact. Neck: Supple.  Heart: RRR. Distinct S1 and S2. No murmurs, gallops, or rubs.  Lungs: Mild increased work of breathing with pursed lip breathing. Rhonchorous lung sounds throughout. Abdomen: Soft, non-distended, and non-tender to palpation. Bowel sounds present. MSK: Normal strength and tone for age. Extremities: No lower extremity edema.    Skin: Warm and dry. Neuro: At first, patient was awake but zoned out and not fully oriented (or would not answer orientation questions or follow commands well). However, throughout interview/exam, mental status improvement. He became alert and oriented x3. No focal deficits. Psych: Normal affect. Responds appropriately.   EKG:  The EKG was personally reviewed and demonstrates:  sinus rhythm, rate 94 bpm, non-specific ST -T wave abnormalities unchanged from previous Telemetry:  Telemetry was personally reviewed and demonstrates:  Sinus rhythm  Relevant CV Studies: Left heart catheterization 04/2020: 1.  Severe multivessel coronary artery disease with severe calcific distal left main stenosis, severe diffuse ostial/proximal LAD stenosis, severe stenosis of the proximal left circumflex/first OM, and interval occlusion of the distal RCA with left-to-right collaterals 2.  Heavily calcified distal abdominal aorta and bilateral iliac arteries with mild diffuse calcified disease.  No severe iliac lesions identified.  Recommend ongoing medical therapy.  With total occlusion of the RCA and severe calcification of the distal left main and complex disease in the LAD and left circumflex, I think risk of protected PCI would be extremely high.  I also  think his access is marginal for large bore support devices from the groin.   Echocardiogram 04/2020: 1. Left ventricular ejection fraction, by estimation, is 55 to 60%. The  left ventricle has normal function. The left ventricle has no regional  wall motion abnormalities. There is moderate left ventricular hypertrophy.  Left ventricular diastolic  parameters are consistent with Grade I diastolic dysfunction (impaired  relaxation).  2. Right ventricular systolic function is normal. The right ventricular  size is normal. There is normal pulmonary artery systolic pressure.  3. Left atrial size was mildly dilated.  4. The mitral valve is abnormal. Moderate mitral valve regurgitation.  5. The aortic valve is tricuspid. Aortic valve regurgitation is not  visualized. Mild aortic valve sclerosis is present, with no evidence of  aortic valve stenosis.   Laboratory Data:  High Sensitivity Troponin:   Recent Labs  Lab 10/03/20 0554 10/13/20 0140 10/13/20 0420 10/16/20 0818 10/16/20 0858  TROPONINIHS 4,408* 126* 131* 161* 173*     Chemistry Recent Labs  Lab 10/13/20 0140 10/16/20 0136  NA 140 141  K 4.5 4.4  CL 95* 96*  CO2 30 27  GLUCOSE 84 130*  BUN 46* 58*  CREATININE 11.73* 11.92*  CALCIUM 8.4* 9.2  GFRNONAA 4* 4*  ANIONGAP 15 18*    Recent Labs  Lab 10/16/20 0136  PROT 7.9  ALBUMIN 3.2*  AST 13*  ALT 10  ALKPHOS 62  BILITOT 0.8   Hematology Recent Labs  Lab 10/13/20 0140 10/16/20 0136  WBC 4.9 7.4  RBC 3.11* 3.27*  HGB 9.7* 10.2*  HCT 29.7* 31.8*  MCV 95.5 97.2  MCH 31.2 31.2  MCHC 32.7 32.1  RDW 17.2* 17.0*  PLT 240 231   BNPNo results for input(s): BNP, PROBNP in the last 168 hours.  DDimer No results for input(s): DDIMER in the last 168 hours.   Radiology/Studies:  DG Chest 2 View  Result Date: 10/16/2020 CLINICAL DATA:  Shortness of breath and wheezing. EXAM: CHEST - 2 VIEW COMPARISON:  October 02, 2020 FINDINGS: Mild  diffuse chronic  appearing increased interstitial lung markings are seen. There is no evidence of acute infiltrate, pleural effusion or pneumothorax. The heart size and mediastinal contours are within normal limits. There is moderate severity calcification of the aortic arch. The visualized skeletal structures are unremarkable. IMPRESSION: No acute or active cardiopulmonary disease. Electronically Signed   By: Virgina Norfolk M.D.   On: 10/16/2020 02:38     Assessment and Plan:   1. Chest pain in patient with severe multivessel CAD: patient presented with chest pain and SOB. Several recent admissions and ED visits for the same. Seen by cardiology earlier this month for chest pain with HsTrop elevated to 4000. With known compliance issues and comorbidities, he was felt to be a poor candidate for CABG or PCI. Palliative care evaluated patient 10/03/20 and patient was agreeable to partial code with DNI, though wished to continue HD treatments. Historically he has struggled with chest pain when he has missed HD sessions or is non-compliant with his medications. EKG today is unchanged from previous. HsTrop with low flat trend in the 100s. BP is markedly elevated and he has not had his HD today. Unfortunate situation with not much to offer from a cardiology standpoint.  - Chest pain previously has improved with dialysis and he is scheduled to go to dialysis at midnight. - Continue aspirin and plavix - Continue statin - Continue amlodipine and metoprolol. Currently on IV Nitro drip - can continue to titrate this for pain control. Can continue to adjust anti-anginal medications tomorrow after dialysis session.  2. Chronic combined CHF: initially felt to be ETOH mediated with subsequent recovery in EF from 45-50% up to 55-60% on last echo 04/2020. Volume is managed by nephrology at HD - Diffuse rhonchi on exam. Will repeat 2-view chest x-ray. - Continue volume management per nephrology at HD - Continue BBlocker - Continue to  monitor daily weights and strict I&Os  3. Paroxysmal atrial fibrillation: appears to be maintaining sinus rhythm. Not a candidate for anticoagulation due to non-compliance - Continue metoprolol for rate control  4. HTN: BP markedly elevated. Most recent BP 188/100. - Will increase metoprolol succinate to 50mg  daily - Continue amlodipine, metoprolol, and imdur  5. HLD: LDL 109 08/2020; goal <70 - Continue atorvastatin  6. ESRD on HD: due for HD today - Continue management per nephrology and primary team  7. Acute mental status change: when I first walked into patient room patient was awake but zoned out. He had difficulty answering questions or following commands. This was an acute change from how he had been earlier per RN. Rapid response was called and STAT head CT was ordered. Mental status then improved and patient was able to answer questions and follow commands. Brief Neuro exam was normal. Patient had received Dilaudid about 2 hours before.  - Suspect mental status change secondary to Dilaudid given improvement after patient woke up some. However, given markedly elevated BP and the fact that patient is a difficult historian, will go ahead and proceed with STAT head CT to rule out stroke.  HEAR Score (for undifferentiated chest pain):  HEAR Score: 6   New York Heart Association (NYHA) Functional Class NYHA Class II   CHA2DS2-VASc Score = 4  This indicates a 4.8% annual risk of stroke. The patient's score is based upon: CHF History: Yes HTN History: Yes Diabetes History: No Stroke History: No Vascular Disease History: Yes Age Score: 1 Gender Score: 0      For questions or updates,  please contact [place] Please consult [email or web address] for contact info under    Trisha Mangle PA 10/16/2020 5:02 PM   Patient seen and examined with Sande Rives PA.  Agree as above, with the following exceptions and changes as noted below. He is current chest pain free on  the nitroglycerin gtt. Mr. Halladay has a past history of significant CAD without revascularization option and chronic angina. Cardiology consulted for chest pain. Gen: NAD, CV: RRR, no murmurs, heart sounds obscured by lung sounds, Lungs: diffuse loudly rhonchorous, Abd: soft, Extrem: Warm, normal thrill of L arm fistula, mild bilateral edema, Neuro/Psych: drowsy, not able to assess orientation due to limited participation in conversation. All available labs, radiology testing, previous records reviewed. Agree with stat head CT, and we will also obtain a CXR given loud diffuse rhonchi on exam. Suspect pulmonary edema from missed dialysis. Agree with continuing nitro gtt for now given severe HTN. We will need to uptitrate his antianginal therapy for the benefit of his chest pain, will do so after assessing hemodynamics post dialysis, which is scheduled for midnight.   Elouise Munroe, MD

## 2020-10-16 NOTE — Plan of Care (Signed)
Initiation of Care plan Problem: Education: Goal: Knowledge of General Education information will improve Description: Including pain rating scale, medication(s)/side effects and non-pharmacologic comfort measures Outcome: Progressing   Problem: Health Behavior/Discharge Planning: Goal: Ability to manage health-related needs will improve Outcome: Progressing   Problem: Clinical Measurements: Goal: Ability to maintain clinical measurements within normal limits will improve Outcome: Progressing Goal: Will remain free from infection Outcome: Progressing Goal: Diagnostic test results will improve Outcome: Progressing Goal: Respiratory complications will improve Outcome: Progressing Goal: Cardiovascular complication will be avoided Outcome: Progressing   Problem: Activity: Goal: Risk for activity intolerance will decrease Outcome: Progressing   Problem: Nutrition: Goal: Adequate nutrition will be maintained Outcome: Progressing   Problem: Coping: Goal: Level of anxiety will decrease Outcome: Progressing   Problem: Elimination: Goal: Will not experience complications related to bowel motility Outcome: Progressing Goal: Will not experience complications related to urinary retention Outcome: Progressing   Problem: Pain Managment: Goal: General experience of comfort will improve Outcome: Progressing   Problem: Safety: Goal: Ability to remain free from injury will improve Outcome: Progressing   Problem: Skin Integrity: Goal: Risk for impaired skin integrity will decrease Outcome: Progressing   Problem: Education: Goal: Understanding of cardiac disease, CV risk reduction, and recovery process will improve Outcome: Progressing Goal: Individualized Educational Video(s) Outcome: Progressing   Problem: Activity: Goal: Ability to tolerate increased activity will improve Outcome: Progressing   Problem: Cardiac: Goal: Ability to achieve and maintain adequate  cardiovascular perfusion will improve Outcome: Progressing   Problem: Health Behavior/Discharge Planning: Goal: Ability to safely manage health-related needs after discharge will improve Outcome: Progressing   Problem: Education: Goal: Knowledge of disease and its progression will improve Outcome: Progressing Goal: Individualized Educational Video(s) Outcome: Progressing   Problem: Fluid Volume: Goal: Compliance with measures to maintain balanced fluid volume will improve Outcome: Progressing   Problem: Health Behavior/Discharge Planning: Goal: Ability to manage health-related needs will improve Outcome: Progressing   Problem: Nutritional: Goal: Ability to make healthy dietary choices will improve Outcome: Progressing   Problem: Clinical Measurements: Goal: Complications related to the disease process, condition or treatment will be avoided or minimized Outcome: Progressing

## 2020-10-16 NOTE — ED Notes (Signed)
[facility] was contacted & they are on the way here to transport pt to the floor.

## 2020-10-16 NOTE — ED Notes (Addendum)
EDP was informed of pt's critical Troponin of 173.

## 2020-10-16 NOTE — ED Triage Notes (Signed)
Patient arrived with EMS from home reports SOB with wheezing this evening , he received Solumedrol 125 mg IV and 2 doses of Duoneb nebulizer prior to arrival . Hemodialysis q Tues/Thurs/Sat. No fever or chills .

## 2020-10-16 NOTE — ED Provider Notes (Signed)
[facility] EMERGENCY DEPARTMENT Provider Note   CSN: [id number] Arrival date & time: 10/16/20  0053     History Chief Complaint  Patient presents with  . Shortness of Breath    Kenneth [name] is a 64 y.o. male.  HPI   Patient presents to ED with complaints of chest pain and shortness of breath.  Patient states he has been having chest pain and shortness of breath for a while now.  Patient's not able to tell me exactly when it first started.  He does have chronic kidney disease and coronary artery disease with recurrent episodes of chest pain.  Patient is a dialysis patient and last went to dialysis on Saturday.  This morning his symptoms were more severe he felt like he was wheezing.  He initially called EMS primarily with complaints of shortness of breath.  He was given Solu-Medrol and DuoNeb.  Patient states he also is having chest pain.  Its a sharp pain in the center of his chest.  He is not having any nausea.  No vomiting.  No numbness or weakness.  Patient was last admitted to the hospital earlier this month for non-ST elevation MI.  Patient has not felt to be a candidate for PCI or CABG based on a catheterization in July of this year.  Past Medical History:  Diagnosis Date  . Acute CHF ([place]) 01/2018  . Cardiomyopathy secondary    likely related to HTN heart disease; possibly ETOH related as well  . Chronic combined systolic and diastolic heart failure (HCC)    Echocardiogram 09/22/11: Moderate LVH, EF 22-63%, grade 3 diastolic dysfunction, mild MR, moderate to severe LAE, mild RVE, mild to moderate TR, small to moderate pericardial effusion  . Coronary artery disease    not felt to be candidate for CABG 08/2018; medical thearpy recomended due to high risk of PCI   . Dysrhythmia    aflutter 04/2016, afib 02/2018, not felt to be a candidate for anticoagulation due to non-compliance and ETOH  . ESRD (end stage renal disease) on dialysis ([place])    due to  hypertensive nephrosclerosis; TTS; [address] (09/01/2018)  . History of alcohol abuse   . Hypertension   . Myocardial infarction [facility])    " mild " per daughter  . PEA (Pulseless electrical activity) ([place])    PEA arrest 03/09/18, treated empirically for hyperkalemia, shock x1 for WCT, given amiodarone, ROSC after 10 min of ACLS    Patient Active Problem List   Diagnosis Date Noted  . Chest pain of uncertain etiology 33/54/5625  . Accelerated hypertension 09/24/2020  . Dyslipidemia 09/24/2020  . NSTEMI (non-ST elevated myocardial infarction) ([place]) 05/12/2020  . Non-ST elevated myocardial infarction ([place]) 05/12/2020  . DNR (do not resuscitate) discussion   . Palliative care by specialist   . Goals of care, counseling/discussion   . Coronary artery disease involving native heart with angina pectoris ([place])   . STEMI (ST elevation myocardial infarction) ([place]) 09/17/2018  . Unstable angina ([place])   . ACS (acute coronary syndrome) ([place]) 09/01/2018  . ESRD (end stage renal disease) ([place]) 07/20/2018  . Acute respiratory failure ([place]) 05/11/2018  . Cardiac arrest ([place]) 03/09/2018  . Acute encephalopathy   . Acute on chronic respiratory failure ([place]) 02/16/2018  . Tobacco dependence 02/16/2018  . Pulmonary edema 01/26/2018  . Acute respiratory failure with hypoxia ([place]) 01/26/2018  . Atrial fibrillation and flutter ([place])   . Shortness of breath   . Acute on chronic  diastolic CHF (congestive heart failure) ([place])   . Hypothermia 10/08/2016  . ESRD on hemodialysis ([place]) 10/08/2016  . Fall 10/08/2016  . Syncope 10/07/2016  . Near syncope 07/07/2016  . Cardiomyopathy ([place]) 07/07/2016  . Ventricular tachycardia, non-sustained ([place]) 07/06/2016  . Hypoglycemia 07/06/2016  . CKD (chronic kidney disease) stage 5, GFR less than 15 ml/min (HCC)   . Alcoholism ([place]) 05/13/2016  . Hypertensive emergency 12/08/2013  . Cardiomyopathy secondary 10/09/2011  . Alcohol withdrawal delirium ([place]) 09/25/2011   . Chronic diastolic CHF (congestive heart failure) ([place]) 09/24/2011    Past Surgical History:  Procedure Laterality Date  . AV FISTULA PLACEMENT Left 05/14/2016   Procedure: LEFT ARM BASILIC VEIN TRANSPOSITION;  Surgeon: Rosetta Posner, MD;  Location: [facility];  Service: Vascular;  Laterality: Left;  . INSERTION OF DIALYSIS CATHETER N/A 06/20/2019   Procedure: INSERTION OF TUNNELED  DIALYSIS CATHETER;  Surgeon: Elam Dutch, MD;  Location: [facility];  Service: Vascular;  Laterality: N/A;  . LEFT HEART CATH AND CORONARY ANGIOGRAPHY N/A 09/02/2018   Procedure: LEFT HEART CATH AND CORONARY ANGIOGRAPHY;  Surgeon: Nelva Bush, MD;  Location: [facility] CV LAB;  Service: Cardiovascular;  Laterality: N/A;  . LEFT HEART CATH AND CORONARY ANGIOGRAPHY N/A 05/14/2020   Procedure: LEFT HEART CATH AND CORONARY ANGIOGRAPHY;  Surgeon: Sherren Mocha, MD;  Location: [facility] CV LAB;  Service: Cardiovascular;  Laterality: N/A;  . PERIPHERAL VASCULAR CATHETERIZATION N/A 05/13/2016   Procedure: Dialysis/Perma Catheter Insertion;  Surgeon: Serafina Mitchell, MD;  Location: [facility] CV LAB;  Service: Cardiovascular;  Laterality: N/A;  . REVISION OF ARTERIOVENOUS GORETEX GRAFT Left 1/96/2229   Procedure: PLICATION OF ARTERIOVENOUS FISTULA LEFT ARM;  Surgeon: Elam Dutch, MD;  Location[facility];  Service: Vascular;  Laterality: Left;       Family History  Problem Relation Age of Onset  . Emphysema Mother   . Cirrhosis Father     Social History   Tobacco Use  . Smoking status: Former Smoker    Packs/day: 2.00    Years: 48.00    Pack years: 96.00    Types: Cigars    Quit date: 10/27/2017    Years since quitting: 2.9  . Smokeless tobacco: Former Systems developer    Types: Secondary school teacher  . Vaping Use: Never used  Substance Use Topics  . Alcohol use: Not Currently    Comment: h/o very heavy use, quit about 2019  . Drug use: No    Home Medications Prior to Admission medications   Medication Sig  Start Date End Date Taking? Authorizing Provider  amLODipine (NORVASC) 10 MG tablet Take 1 tablet (10 mg total) by mouth daily. 10/09/20   Erlene Quan, PA-C  aspirin EC 81 MG EC tablet Take 1 tablet (81 mg total) by mouth daily. Swallow whole. 05/16/20   Alma Friendly, MD  atorvastatin (LIPITOR) 80 MG tablet Take 1 tablet (80 mg total) by mouth daily. 10/09/20   Erlene Quan, PA-C  clopidogrel (PLAVIX) 75 MG tablet Take 1 tablet (75 mg total) by mouth daily. 10/09/20   Kilroy, Doreene Burke, PA-C  diphenhydramine-acetaminophen (TYLENOL PM EXTRA STRENGTH) 25-500 MG TABS tablet Take 2 tablets by mouth at bedtime as needed (Pain, Sleep).  05/12/18   [provider]  isosorbide mononitrate (IMDUR) 30 MG 24 hr tablet Take 3 tablets (90 mg total) by mouth daily. 10/09/20   Erlene Quan, PA-C  metoprolol succinate (TOPROL-XL) 25 MG 24 hr tablet Take  1 tablet (25 mg total) by mouth daily. 10/09/20   Erlene Quan, PA-C  nitroGLYCERIN (NITROSTAT) 0.4 MG SL tablet Place 1 tablet (0.4 mg total) under the tongue every 5 (five) minutes as needed for chest pain (max 3 doses). 10/13/20   Truddie Hidden, MD    Allergies    Patient has no known allergies.  Review of Systems   Review of Systems  All other systems reviewed and are negative.   Physical Exam Updated Vital Signs BP (!) 184/94   Pulse 87   Temp 97.6 F (36.4 C) (Oral)   Resp (!) 22   Ht 1.778 m (5\' 10" )   Wt 100 kg   SpO2 99%   BMI 31.63 kg/m   Physical Exam Vitals and nursing note reviewed.  Constitutional:      General: He is in acute distress.     Appearance: He is well-developed and well-nourished. He is ill-appearing.  HENT:     Head: Normocephalic and atraumatic.     Right Ear: External ear normal.     Left Ear: External ear normal.  Eyes:     General: No scleral icterus.       Right eye: No discharge.        Left eye: No discharge.     Conjunctiva/sclera: Conjunctivae normal.  Neck:     Trachea: No  tracheal deviation.  Cardiovascular:     Rate and Rhythm: Normal rate and regular rhythm.     Pulses: Intact distal pulses.  Pulmonary:     Effort: Pulmonary effort is normal. No respiratory distress.     Breath sounds: Normal breath sounds. No stridor. No wheezing or rales.  Abdominal:     General: Bowel sounds are normal. There is no distension.     Palpations: Abdomen is soft.     Tenderness: There is no abdominal tenderness. There is no guarding or rebound.  Musculoskeletal:        General: No tenderness or edema.     Cervical back: Neck supple.     Comments: Av fistula left upper extremity  Skin:    General: Skin is warm and dry.     Findings: No rash.  Neurological:     Mental Status: He is alert.     Cranial Nerves: No cranial nerve deficit (no facial droop, extraocular movements intact, no slurred speech).     Sensory: No sensory deficit.     Motor: No abnormal muscle tone or seizure activity.     Coordination: Coordination normal.     Deep Tendon Reflexes: Strength normal.  Psychiatric:        Mood and Affect: Mood and affect normal.     ED Results / Procedures / Treatments   Labs (all labs ordered are listed, but only abnormal results are displayed) Labs Reviewed  CBC WITH DIFFERENTIAL/PLATELET - Abnormal; Notable for the following components:      Result Value   RBC 3.27 (*)    Hemoglobin 10.2 (*)    HCT 31.8 (*)    RDW 17.0 (*)    Eosinophils Absolute 0.6 (*)    All other components within normal limits  COMPREHENSIVE METABOLIC PANEL - Abnormal; Notable for the following components:   Chloride 96 (*)    Glucose, Bld 130 (*)    BUN 58 (*)    Creatinine, Ser 11.92 (*)    Albumin 3.2 (*)    AST 13 (*)    GFR, Estimated 4 (*)  Anion gap 18 (*)    All other components within normal limits  TROPONIN I (HIGH SENSITIVITY) - Abnormal; Notable for the following components:   Troponin I (High Sensitivity) 161 (*)    All other components within normal limits   TROPONIN I (HIGH SENSITIVITY) - Abnormal; Notable for the following components:   Troponin I (High Sensitivity) 173 (*)    All other components within normal limits  RESP PANEL BY RT-PCR (FLU A&B, COVID) [id number]    EKG EKG Interpretation  Date/Time:  Tuesday October 16 2020 05:27:39 EST Ventricular Rate:  94 PR Interval:  190 QRS Duration: 100 QT Interval:  380 QTC Calculation: 475 R Axis:   51 Text Interpretation: Normal sinus rhythm ST & T wave abnormality, consider inferolateral ischemia Prolonged QT Abnormal ECG No significant change since last tracing Confirmed by Aletta Edouard [phone]) on 10/16/2020 7:13:28 AM   Radiology DG Chest 2 View  Result Date: 10/16/2020 CLINICAL DATA:  Shortness of breath and wheezing. EXAM: CHEST - 2 VIEW COMPARISON:  October 02, 2020 FINDINGS: Mild diffuse chronic appearing increased interstitial lung markings are seen. There is no evidence of acute infiltrate, pleural effusion or pneumothorax. The heart size and mediastinal contours are within normal limits. There is moderate severity calcification of the aortic arch. The visualized skeletal structures are unremarkable. IMPRESSION: No acute or active cardiopulmonary disease. Electronically Signed   By: Virgina Norfolk M.D.   On: 10/16/2020 02:38    Procedures .Critical Care Performed by: Dorie Rank, MD Authorized by: Dorie Rank, MD   Critical care provider statement:    Critical care time (minutes):  35   Critical care was time spent personally by me on the following activities:  Discussions with consultants, evaluation of patient's response to treatment, examination of patient, ordering and performing treatments and interventions, ordering and review of laboratory studies, ordering and review of radiographic studies, pulse oximetry, re-evaluation of patient's condition, obtaining history from patient or surrogate and review of old charts   (including critical care time)  Medications  Ordered in ED Medications  nitroGLYCERIN (NITROSTAT) SL tablet 0.4 mg (has no administration in time range)  morphine 4 MG/ML injection 4 mg (4 mg Intravenous Given 10/16/20 0907)  aspirin chewable tablet 324 mg (324 mg Oral Given 10/16/20 8295)    ED Course  I have reviewed the triage vital signs and the nursing notes.  Pertinent labs & imaging results that were available during my care of the patient were reviewed by me and considered in my medical decision making (see chart for details).  Clinical Course as of 10/16/20 1402  Tue Oct 16, 2020  0921 CBC shows a stable anemia.  Metabolic panel consistent with his chronic renal failure but no acute electrolyte abnormalities [JK]  0922 Chest x-ray without signs of pneumonia or pulmonary edema [JK]  0922 First troponin elevated at 161, similar to previous values , 3 days ago it was 130.  Will check on delta troponin [JK]  1401 Discussed with Dr Lorrin Jackson.  Will admit  [JK]    Clinical Course User Index [JK] Dorie Rank, MD   MDM Rules/Calculators/A&P                         Patient presented to ED with complaints of shortness of breath as well as chest discomfort.  Patient has known history of coronary artery disease as well as end-stage renal failure.  Patient presentation concerning for the possibility of non-ST elevation  MI, unstable angina, pneumonia, fluid overload.  Patient does not have any evidence of pulmonary edema on chest x-ray.  Patient's troponin is elevated and delta troponin is increased.  Levels are not to the degree with his previous STEMI.  This may be related to his chronic kidney disease and hypertension but considering his history I think it is reasonable to bring him in for observation so we can continue to monitor his troponin.  Patient was also due for dialysis today and he will need to have that as well.  I will consult with the medical service as well as nephrology.  Final Clinical Impression(s) / ED Diagnoses Final  diagnoses:  Chest pain, unspecified type  Troponin level elevated  Stage 5 chronic kidney disease on chronic dialysis [facility])      Dorie Rank, MD 10/16/20 1314

## 2020-10-16 NOTE — Significant Event (Signed)
Rapid Response Event Note   Reason for Call :  Decreased LOC, altered Mental status  Initial Focused Assessment:  Patient is drowsy but easy to arouse and answers questions appropriately.  He received 1 mg Dilaudid at 1649. He is still complaining of CP 7/10 (left upper chest) He does endorse some mild SOB  187/117  SR 85  RR 18  O2 sat 91% on 4L [place]    Interventions:  Repositioned in the bed Titrated NTG to 59mcg  CP 3/10 12 lead EKG done  Plan of Care:  RN to call if assistance needed   Event Summary:   MD Notified: Sande Rives PA at bedside,  [facility] admit paged Call Time: [place] Time: 1838 End Time: 1915  Raliegh Ip, RN

## 2020-10-16 NOTE — Consult Note (Signed)
Reason for Consult: To manage dialysis and dialysis related needs  Referring Physician: chest pain  Kenneth [name] is an 64 y.o. male.   HPI:  Pt is a 41M with a PMH sig for ESRD on HD TTS at [facility], severe 3v CAD, h/o cardiac arrest, EtOH abuse, CHF with EF 30-35% who is now seen in consultation at the request of Dr. Jonnie [name] for management of ESRD and provision of HD.    [name] when he reported SOB and CP.  Called EMS- notes wheezing, given duoneb and solumedrol.  Came to ED.  Trops noted to be 121--> 131--> 161-- 173.  In this setting we are asked to see.  He has not felt to be a candidate for PCI or CABG.  EKG some ST depressions V4-V6 and I and II.  [name].  BP 190/ 101, on NTG gtt @ 3.   Still has some SOB.  Last HD 12/18.  Stayed 3 hr 30 min of 4 hr rx.  Left 1 kg over EDW.    Dialyzes at [facility] TTS EDW 84 kg 2 K/ 2Ca  F180 BFR 400 DFR 800 No UF profile Heparin 2400 u bolus Sensipar 30 TIW Mircera 100 q 2 weeks  Calcitriol 2.25 mcg TIW  Past Medical History:  Diagnosis Date  . Acute CHF ([place]) 01/2018  . Cardiomyopathy secondary    likely related to HTN heart disease; possibly ETOH related as well  . Chronic combined systolic and diastolic heart failure (HCC)    Echocardiogram 09/22/11: Moderate LVH, EF 16-07%, grade 3 diastolic dysfunction, mild MR, moderate to severe LAE, mild RVE, mild to moderate TR, small to moderate pericardial effusion  . Coronary artery disease    not felt to be candidate for CABG 08/2018; medical thearpy recomended due to high risk of PCI   . Dysrhythmia    aflutter 04/2016, afib 02/2018, not felt to be a candidate for anticoagulation due to non-compliance and ETOH  . ESRD (end stage renal disease) on dialysis ([place])    due to hypertensive nephrosclerosis; TTS; [facility] (09/01/2018)  . History of alcohol abuse   . Hypertension   . Myocardial infarction [facility])    " mild " per daughter  . PEA  (Pulseless electrical activity) ([place])    PEA arrest 03/09/18, treated empirically for hyperkalemia, shock x1 for WCT, given amiodarone, ROSC after 10 min of ACLS    Past Surgical History:  Procedure Laterality Date  . AV FISTULA PLACEMENT Left 05/14/2016   Procedure: LEFT ARM BASILIC VEIN TRANSPOSITION;  Surgeon: Rosetta Posner, MD;  Location: [facility];  Service: Vascular;  Laterality: Left;  . INSERTION OF DIALYSIS CATHETER N/A 06/20/2019   Procedure: INSERTION OF TUNNELED  DIALYSIS CATHETER;  Surgeon: Elam Dutch, MD;  Location: [facility];  Service: Vascular;  Laterality: N/A;  . LEFT HEART CATH AND CORONARY ANGIOGRAPHY N/A 09/02/2018   Procedure: LEFT HEART CATH AND CORONARY ANGIOGRAPHY;  Surgeon: Nelva Bush, MD;  Location: [place] CV LAB;  Service: Cardiovascular;  Laterality: N/A;  . LEFT HEART CATH AND CORONARY ANGIOGRAPHY N/A 05/14/2020   Procedure: LEFT HEART CATH AND CORONARY ANGIOGRAPHY;  Surgeon: Sherren Mocha, MD;  Location: [facility] CV LAB;  Service: Cardiovascular;  Laterality: N/A;  . PERIPHERAL VASCULAR CATHETERIZATION N/A 05/13/2016   Procedure: Dialysis/Perma Catheter Insertion;  Surgeon: Serafina Mitchell, MD;  Location: [place] CV LAB;  Service: Cardiovascular;  Laterality: N/A;  . REVISION OF ARTERIOVENOUS GORETEX GRAFT Left 6/38/7564   Procedure: PLICATION OF ARTERIOVENOUS FISTULA LEFT ARM;  Surgeon: Elam Dutch, MD;  Location: [facility] OR;  Service: Vascular;  Laterality: Left;    Family History  Problem Relation Age of Onset  . Emphysema Mother   . Cirrhosis Father     Social History:  reports that he quit smoking about 2 years ago. His smoking use included cigars. He has a 96.00 pack-year smoking history. He has quit using smokeless tobacco.  His smokeless tobacco use included chew. He reports previous alcohol use. He reports that he does not use drugs.  Allergies: No Known Allergies  Medications:  Scheduled: . [START ON 10/17/2020] Chlorhexidine  Gluconate Cloth  6 each Topical Q0600  . heparin  2,400 Units Dialysis Once in dialysis     Results for orders placed or performed during the hospital encounter of 10/16/20 (from the past 48 hour(s))  CBC with Differential     Status: Abnormal   Collection Time: 10/16/20  1:36 AM  Result Value Ref Range   WBC 7.4 4.0 - 10.5 K/uL   RBC 3.27 (L) 4.22 - 5.81 MIL/uL   Hemoglobin 10.2 (L) 13.0 - 17.0 g/dL   HCT 31.8 (L) 39.0 - 52.0 %   MCV 97.2 80.0 - 100.0 fL   MCH 31.2 26.0 - 34.0 pg   MCHC 32.1 30.0 - 36.0 g/dL   RDW 17.0 (H) 11.5 - 15.5 %   Platelets 231 150 - 400 K/uL   nRBC 0.0 0.0 - 0.2 %   Neutrophils Relative % 66 %   Neutro Abs 4.9 1.7 - 7.7 K/uL   Lymphocytes Relative 18 %   Lymphs Abs 1.4 0.7 - 4.0 K/uL   Monocytes Relative 7 %   Monocytes Absolute 0.5 0.1 - 1.0 K/uL   Eosinophils Relative 8 %   Eosinophils Absolute 0.6 (H) 0.0 - 0.5 K/uL   Basophils Relative 1 %   Basophils Absolute 0.1 0.0 - 0.1 K/uL   Immature Granulocytes 0 %   Abs Immature Granulocytes 0.03 0.00 - 0.07 K/uL    Comment: Performed at [facility], [address] [place],  [address]  Comprehensive metabolic panel     Status: Abnormal   Collection Time: 10/16/20  1:36 AM  Result Value Ref Range   Sodium 141 135 - 145 mmol/L   Potassium 4.4 3.5 - 5.1 mmol/L   Chloride 96 (L) 98 - 111 mmol/L   CO2 27 22 - 32 mmol/L   Glucose, Bld 130 (H) 70 - 99 mg/dL    Comment: Glucose reference range applies only to samples taken after fasting for at least 8 hours.   BUN 58 (H) 8 - 23 mg/dL   Creatinine, Ser 11.92 (H) 0.61 - 1.24 mg/dL   Calcium 9.2 8.9 - 10.3 mg/dL   Total Protein 7.9 6.5 - 8.1 g/dL   Albumin 3.2 (L) 3.5 - 5.0 g/dL   AST 13 (L) 15 - 41 U/L   ALT 10 0 - 44 U/L   Alkaline Phosphatase 62 38 - 126 U/L   Total Bilirubin 0.8 0.3 - 1.2 mg/dL   GFR, Estimated 4 (L) >60 mL/min    Comment: (NOTE) Calculated using the CKD-EPI Creatinine Equation (2021)    Anion gap 18 (H) 5 - 15     Comment: Performed at [facility], [place] [address] [place], [place] [address]  Troponin I (High Sensitivity)  Status: Abnormal   Collection Time: 10/16/20  8:18 AM  Result Value Ref Range   Troponin I (High Sensitivity) 161 (HH) <18 ng/L    Comment: CRITICAL VALUE NOTED.  VALUE IS CONSISTENT WITH PREVIOUSLY REPORTED AND CALLED VALUE. (NOTE) Elevated high sensitivity troponin I (hsTnI) values and significant  changes across serial measurements may suggest ACS but many other  chronic and acute conditions are known to elevate hsTnI results.  Refer to the Links section for chest pain algorithms and additional  guidance. Performed at [facility], [place] [address] [place], [place] [address]   Troponin I (High Sensitivity)     Status: Abnormal   Collection Time: 10/16/20  8:58 AM  Result Value Ref Range   Troponin I (High Sensitivity) 173 (HH) <18 ng/L    Comment: CRITICAL RESULT CALLED TO, READ BACK BY AND VERIFIED WITH: PULLIAM,P RN @1231  ON [id number] BY FLEMINGS (NOTE) Elevated high sensitivity troponin I (hsTnI) values and significant  changes across serial measurements may suggest ACS but many other  chronic and acute conditions are known to elevate hsTnI results.  Refer to the Links section for chest pain algorithms and additional  guidance. Performed at [facility], [place] [address] [place], [place] [address]     DG Chest 2 View  Result Date: 10/16/2020 CLINICAL DATA:  Shortness of breath and wheezing. EXAM: CHEST - 2 VIEW COMPARISON:  October 02, 2020 FINDINGS: Mild diffuse chronic appearing increased interstitial lung markings are seen. There is no evidence of acute infiltrate, pleural effusion or pneumothorax. The heart size and mediastinal contours are within normal limits. There is moderate severity calcification of the aortic arch. The visualized skeletal structures are unremarkable. IMPRESSION: No acute or active cardiopulmonary disease. Electronically  Signed   By: Virgina Norfolk M.D.   On: 10/16/2020 02:38    ROS: all other systems reviewed and are negative except as per HPI  Blood pressure (!) 166/82, pulse 85, temperature 97.6 F (36.4 C), temperature source Oral, resp. rate 19, height 5\' 10"  (1.778 m), weight 100 kg, SpO2 100 %. .  GEN sitting on edge of gurney HEENT EOMI PERRL NECK  + JVD to angle of mandible PULM bilateral wheezing CV RRR no m/r/g ABD soft  EXT trace LE edema NEURO AAO x 3 nonfocal SKIN dry with some scratching ACCESS: L AVF + T/B, aneurysmal  Assessment/Plan: 1 CP/SOB: trops slightly up, EKG with some ST depressions.  Cardiology to see.  To date has been medically managed.  On NTG gtt. 2.  Hypertensive urgency: expect to improve if we can get some fluid off  3 ESRD: TTS [facility]- will provide urgent dialysis today and hopefully get some fluid off (~4L goal), may help CP if large driver is fluid overload.  ED weight 100 kg, EDW 84 kg, we will need to get an accurate one. 4. Anemia of ESRD: Hgb 10.2, no ESA needed at this time 5. Metabolic Bone Disease: sensipar and calcitriol, fosrenol is binder 6.  CHF with EF 30-35%--> improved to 55-60% 04/2020 7.  EtOH abuse 8.  Dispo: admitted  Madelon Lips 10/16/2020, 3:47 PM

## 2020-10-17 ENCOUNTER — Encounter (HOSPITAL_COMMUNITY): Payer: Self-pay | Admitting: Nephrology

## 2020-10-17 DIAGNOSIS — I208 Other forms of angina pectoris: Secondary | ICD-10-CM

## 2020-10-17 LAB — CBC
HCT: 30.9 % — ABNORMAL LOW (ref 39.0–52.0)
Hemoglobin: 10.1 g/dL — ABNORMAL LOW (ref 13.0–17.0)
MCH: 31.2 pg (ref 26.0–34.0)
MCHC: 32.7 g/dL (ref 30.0–36.0)
MCV: 95.4 fL (ref 80.0–100.0)
Platelets: 262 10*3/uL (ref 150–400)
RBC: 3.24 MIL/uL — ABNORMAL LOW (ref 4.22–5.81)
RDW: 16.9 % — ABNORMAL HIGH (ref 11.5–15.5)
WBC: 11 10*3/uL — ABNORMAL HIGH (ref 4.0–10.5)
nRBC: 0 % (ref 0.0–0.2)

## 2020-10-17 LAB — BASIC METABOLIC PANEL
Anion gap: 15 (ref 5–15)
BUN: 29 mg/dL — ABNORMAL HIGH (ref 8–23)
CO2: 26 mmol/L (ref 22–32)
Calcium: 8.2 mg/dL — ABNORMAL LOW (ref 8.9–10.3)
Chloride: 97 mmol/L — ABNORMAL LOW (ref 98–111)
Creatinine, Ser: 7.67 mg/dL — ABNORMAL HIGH (ref 0.61–1.24)
GFR, Estimated: 7 mL/min — ABNORMAL LOW (ref >60)
Glucose, Bld: 89 mg/dL (ref 70–99)
Potassium: 4.3 mmol/L (ref 3.5–5.1)
Sodium: 138 mmol/L (ref 135–145)

## 2020-10-17 MED ORDER — CINACALCET HCL 30 MG PO TABS
30.0000 mg | ORAL_TABLET | ORAL | Status: DC
  Administered 2020-10-18: 12:00:00 30 mg via ORAL
  Filled 2020-10-17 (×2): qty 1

## 2020-10-17 MED ORDER — CALCITRIOL 0.25 MCG PO CAPS
2.2500 ug | ORAL_CAPSULE | ORAL | Status: DC
  Administered 2020-10-18: 12:00:00 2.25 ug via ORAL
  Filled 2020-10-17: qty 9

## 2020-10-17 NOTE — Consult Note (Signed)
   [facility] Inpatient Consult   10/17/2020  Kenneth [name] Jun 15, 1956 [id number]  [place] Organization [ACO] Patient: Medicare NextGen   Patient is currently assigned  with [facility] for chronic disease management services.  Patient has difficulty reaching or maintaining contact with patient noted in chart review by a [place].    Plan: Will make [facility] RN CM aware of admission.  Will follow up with  Inpatient Transition Of Care [TOC] team member to make aware that [facility] [facility] Management following.   Of note, [facility] Care Management services does not replace or interfere with any services that are needed or arranged by inpatient [facility] care management team.  For additional questions or referrals please contact:  [name], RN BSN [facility] Liaison  [phone] business mobile phone Toll free office [phone]  Fax number: [phone] [name]

## 2020-10-17 NOTE — Progress Notes (Signed)
PROGRESS NOTE    Kenneth [name]  UXN:[id number] DOB: 1956-01-04 DOA: 10/16/2020 PCP: [name], MD   Brief Narrative:  The patient is a 64 y.o. year-old w/ hx of MI, HTN, PEA arrest (2019), hx etoh abuse, ESRD on HD, chronic combined syst/ diast CHF and ESRD on HD TTS.  Pt arrived to ED via EMS from home reporting SOB/ wheezing and chest pain. He was given Solumedrol IV and Duoneb. No N/V or diaphoresis. Pt was admitted for earlier this month for NSTEMI.  He has known severe 3VCAD and was not felt to be a candidate for PCI per notes from cardiology July 2021 (see below) and not a CABG candidate per notes from 2019 (see below). Pt is on medical management. Troponin's here are 126 - 131 - 161 - 173. Pt rec'd 4mg  MSOr, asa 324mg  in ED. COVID pending. This is 4th admission for chest pain in the last month, prior to that was July 2021 (last heart cath).   Patient started on nitroglycerin drip.  His chest pain resolved. Nitro drip stopped on 10/17/20.    Assessment & Plan:   Active Problems:   Essential hypertension   ESRD on hemodialysis [facility])   Coronary artery disease involving native heart with angina pectoris (HCC)   Angina at rest [facility])   1. Chest pain - pt has known severe 3VCAD including L main disease, f/b cardiology. Not PCI or CABG candidate, on medical rx. Having "lots" of chest pain, gets better w/ HD usually. Trop 100's, stable here. EKG mild lateral ST depression, not new. Cont meds BB, imdur, plavix, asa. IV NTG ordered since CP worsening, repeat MSO4. IV labetalol x 1. Cards consulted.  10/17/20 Chest pain resolved with nitro drip, BP improved. S/p HD. Per Cardiology, continue medical management.   2. ESRD - on HD TTS. Last HD Sat per patient.  No sig vol overload, CXR clear.  Will consult nephrology.  10/17/20 S/p HD with fluid removed, patient reports feeling better.   3. HTN - cont BB, norvasc 4. H/o PEA arrest - in 2019 5. H/o combined syst/ diast CHF - most recent  LVEF was better at 55% 6. Prognosis - guarded. Pt would like full code.     DVT prophylaxis: Heparin Sq Code Status: Full  Family Communication: None Disposition Plan: If no recurrent chest pain overnight and if BP remains stable, may be ready for dc tomorrow.   Consultants:   Cardiology  Nephrology  Procedures:  None Antimicrobials:  None  Subjective: Patient denies dyspnea, palpitations or chest pain.   Objective: Vitals:   10/17/20 0600 10/17/20 0730 10/17/20 1000 10/17/20 1644  BP: 135/76 136/77 (!) 149/82 134/74  Pulse: 69 74 78 89  Resp: 15 (!) 25  17  Temp:  97.9 F (36.6 C)  97.9 F (36.6 C)  TempSrc:  Oral  Oral  SpO2: 95%   91%  Weight:      Height:        Intake/Output Summary (Last 24 hours) at 10/17/2020 1812 Last data filed at 10/17/2020 0358 Gross per 24 hour  Intake 132.16 ml  Output 3036 ml  Net -2903.84 ml   Filed Weights   10/16/20 2340 10/17/20 0358 10/17/20 0426  Weight: 87.9 kg 84.7 kg 84.8 kg    Examination:  General exam: Appears calm and comfortable  Respiratory system:No cough. Respiratory effort normal. Cardiovascular system: S1 & S2 heard, RRR.  Gastrointestinal system: Abdomen is nondistended, soft and nontender. Central nervous system:  Alert and oriented. No focal neurological deficits. Extremities: Symmetric 5 x 5 power. Skin: No rashes, lesions or ulcers Psychiatry: Judgement and insight appear normal. Mood & affect appropriate.     Data Reviewed: I have personally reviewed following labs   CBC: Recent Labs  Lab 10/13/20 0140 10/16/20 0136 10/17/20 0502  WBC 4.9 7.4 11.0*  NEUTROABS  --  4.9  --   HGB 9.7* 10.2* 10.1*  HCT 29.7* 31.8* 30.9*  MCV 95.5 97.2 95.4  PLT 240 231 462   Basic Metabolic Panel: Recent Labs  Lab 10/13/20 0140 10/16/20 0136 10/17/20 0502  NA 140 141 138  K 4.5 4.4 4.3  CL 95* 96* 97*  CO2 30 27 26   GLUCOSE 84 130* 89  BUN 46* 58* 29*  CREATININE 11.73* 11.92* 7.67*   CALCIUM 8.4* 9.2 8.2*   GFR: Estimated Creatinine Clearance: 9.8 mL/min (A) (by C-G formula based on SCr of 7.67 mg/dL (H)). Liver Function Tests: Recent Labs  Lab 10/16/20 0136  AST 13*  ALT 10  ALKPHOS 62  BILITOT 0.8  PROT 7.9  ALBUMIN 3.2*   No results for input(s): LIPASE, AMYLASE in the last 168 hours. No results for input(s): AMMONIA in the last 168 hours. Coagulation Profile: No results for input(s): INR, PROTIME in the last 168 hours. Cardiac Enzymes: No results for input(s): CKTOTAL, CKMB, CKMBINDEX, TROPONINI in the last 168 hours. BNP (last 3 results) No results for input(s): PROBNP in the last 8760 hours. HbA1C: No results for input(s): HGBA1C in the last 72 hours. CBG: No results for input(s): GLUCAP in the last 168 hours. Lipid Profile: No results for input(s): CHOL, HDL, LDLCALC, TRIG, CHOLHDL, LDLDIRECT in the last 72 hours. Thyroid Function Tests: No results for input(s): TSH, T4TOTAL, FREET4, T3FREE, THYROIDAB in the last 72 hours. Anemia Panel: No results for input(s): VITAMINB12, FOLATE, FERRITIN, TIBC, IRON, RETICCTPCT in the last 72 hours. Sepsis Labs: No results for input(s): PROCALCITON, LATICACIDVEN in the last 168 hours.  Recent Results (from the past 240 hour(s))  Resp Panel by RT-PCR (Flu A&B, Covid) Nasopharyngeal Swab     Status: None   Collection Time: 10/16/20  1:10 PM   Specimen: Nasopharyngeal Swab; Nasopharyngeal(NP) swabs in vial transport medium  Result Value Ref Range Status   SARS Coronavirus 2 by RT PCR NEGATIVE NEGATIVE Final    Comment: (NOTE) SARS-CoV-2 target nucleic acids are NOT DETECTED.  The SARS-CoV-2 RNA is generally detectable in upper respiratory specimens during the acute phase of infection. The lowest concentration of SARS-CoV-2 viral copies this assay can detect is 138 copies/mL. A negative result does not preclude SARS-Cov-2 infection and should not be used as the sole basis for treatment or other patient  management decisions. A negative result may occur with  improper specimen collection/handling, submission of specimen other than nasopharyngeal swab, presence of viral mutation(s) within the areas targeted by this assay, and inadequate number of viral copies(<138 copies/mL). A negative result must be combined with clinical observations, patient history, and epidemiological information. The expected result is Negative.  Fact Sheet for Patients:  EntrepreneurPulse.com.au  Fact Sheet for Healthcare Providers:  IncredibleEmployment.be  This test is no t yet approved or cleared by the [place] FDA and  has been authorized for detection and/or diagnosis of SARS-CoV-2 by FDA under an Emergency Use Authorization (EUA). This EUA will remain  in effect (meaning this test can be used) for the duration of the COVID-19 declaration under Section 564(b)(1) of the Act, 21 U.S.C.section  360bbb-3(b)(1), unless the authorization is terminated  or revoked sooner.       Influenza A by PCR NEGATIVE NEGATIVE Final   Influenza B by PCR NEGATIVE NEGATIVE Final    Comment: (NOTE) The Xpert Xpress SARS-CoV-2/FLU/RSV plus assay is intended as an aid in the diagnosis of influenza from Nasopharyngeal swab specimens and should not be used as a sole basis for treatment. Nasal washings and aspirates are unacceptable for Xpert Xpress SARS-CoV-2/FLU/RSV testing.  Fact Sheet for Patients: EntrepreneurPulse.com.au  Fact Sheet for Healthcare Providers: IncredibleEmployment.be  This test is not yet approved or cleared by the [place] FDA and has been authorized for detection and/or diagnosis of SARS-CoV-2 by FDA under an Emergency Use Authorization (EUA). This EUA will remain in effect (meaning this test can be used) for the duration of the COVID-19 declaration under Section 564(b)(1) of the Act, 21 U.S.C. section 360bbb-3(b)(1),  unless the authorization is terminated or revoked.  Performed at [facility], [place] [address], [place], [place] [address]          Radiology Studies: DG Chest 2 View  Result Date: 10/16/2020 CLINICAL DATA:  Short of breath for 1 day, history of CHF EXAM: CHEST - 2 VIEW COMPARISON:  10/16/2020 at 2 a.m. FINDINGS: Frontal and lateral views of the chest demonstrate a stable cardiac silhouette allowing for differences in positioning and technique. Since exam performed earlier today, there is increased central vascular congestion with new development of interstitial opacities and bilateral ground-glass airspace disease. No large effusion. No pneumothorax. No acute bony abnormalities. IMPRESSION: 1. Findings most consistent with pulmonary edema, which has developed in the interim since prior exam. Electronically Signed   By: Randa Ngo M.D.   On: 10/16/2020 20:38   DG Chest 2 View  Result Date: 10/16/2020 CLINICAL DATA:  Shortness of breath and wheezing. EXAM: CHEST - 2 VIEW COMPARISON:  October 02, 2020 FINDINGS: Mild diffuse chronic appearing increased interstitial lung markings are seen. There is no evidence of acute infiltrate, pleural effusion or pneumothorax. The heart size and mediastinal contours are within normal limits. There is moderate severity calcification of the aortic arch. The visualized skeletal structures are unremarkable. IMPRESSION: No acute or active cardiopulmonary disease. Electronically Signed   By: Virgina Norfolk M.D.   On: 10/16/2020 02:38   CT HEAD WO CONTRAST  Result Date: 10/16/2020 CLINICAL DATA:  Mental status change.  Unknown cause. EXAM: CT HEAD WITHOUT CONTRAST TECHNIQUE: Contiguous axial images were obtained from the base of the skull through the vertex without intravenous contrast. COMPARISON:  CT head 03/15/2018 FINDINGS: Brain: Cerebral ventricle sizes are concordant with the degree of cerebral volume loss. Patchy and confluent areas of  decreased attenuation are noted throughout the deep and periventricular white matter of the cerebral hemispheres bilaterally, compatible with chronic microvascular ischemic disease. No suspicious lytic or blastic osseous lesions. No acute displaced fracture. Multilevel degenerative changes of the spine. No evidence of large-territorial acute infarction. No parenchymal hemorrhage. No mass lesion. No extra-axial collection. No mass effect or midline shift. No hydrocephalus. Basilar cisterns are patent. Vascular: No hyperdense vessel. Atherosclerotic calcifications are present within the cavernous internal carotid and bilateral vertebral arteries. Skull: No acute fracture or focal lesion. Sinuses/Orbits: Paranasal sinuses and mastoid air cells are clear. The orbits are unremarkable. Other: None. IMPRESSION: No acute intracranial abnormality. Electronically Signed   By: Iven Finn M.D.   On: 10/16/2020 20:24        Scheduled Meds: . amLODipine  10 mg  Oral Daily  . aspirin EC  81 mg Oral Daily  . atorvastatin  80 mg Oral Daily  . [START ON 10/18/2020] calcitRIOL  2.25 mcg Oral Q T,Th,Sa-HD  . Chlorhexidine Gluconate Cloth  6 each Topical Q0600  . [START ON 10/18/2020] cinacalcet  30 mg Oral Q T,Th,Sa-HD  . clopidogrel  75 mg Oral Daily  . heparin  5,000 Units Subcutaneous Q12H  . isosorbide mononitrate  60 mg Oral Daily  . metoprolol succinate  50 mg Oral Daily  . multivitamin  1 tablet Oral QHS  . sodium chloride flush  3 mL Intravenous Q12H   Continuous Infusions: . sodium chloride    . sodium chloride    . sodium chloride    . nitroGLYCERIN Stopped (10/16/20 2357)     LOS: 1 day    Time spent: 25 minutes    Blain Pais, MD [facility]   If 7PM-7AM, please contact night-coverage [email or web address] Password [facility] 10/17/2020, 6:12 PM

## 2020-10-17 NOTE — Progress Notes (Signed)
  [facility] Progress Note   Assessment/ Plan:    Dialyzes at [facility] TTS EDW 84 kg 2 K/ 2Ca  F180 BFR 400 DFR 800 No UF profile Heparin 2400 u bolus Sensipar 30 TIW Mircera 100 q 2 weeks  Calcitriol 2.25 mcg TIW  Assessment/Plan: 1 CP/SOB: trops slightly up, EKG with some ST depressions.  Cardiology  Following.  Lesions historically not amenable to PCI or CABG, medically managed to date 2.  Hypertensive urgency: expect to improve if we can get some fluid off--. Off nitro gtt today 3 ESRD: TTS [facility]- next HD planned for tomorrow 12/23. 4. Anemia of ESRD: Hgb 10.2, no ESA needed at this time 5. Metabolic Bone Disease: sensipar and calcitriol, fosrenol is binder 6.  CHF with EF 30-35%--> improved to 55-60% 04/2020 7.  EtOH abuse 8.  Dispo: pending  Subjective:    Seen in room.  Had HD yesterday with 3L off.  Off nitro gtt.  Had a CT head last night which was negative for acute process.     Objective:   BP (!) 149/82   Pulse 78   Temp 97.9 F (36.6 C) (Oral)   Resp (!) 25   Ht 5\' 10"  (1.778 m)   Wt 84.8 kg   SpO2 95%   BMI 26.82 kg/m   Physical Exam: GEN lying in bed, NAD HEENT EOMI PERRL NECK  no JVD PULM wheezing improved CV RRR no m/r/g ABD soft  EXT trace LE edema NEURO AAO x 3 nonfocal SKIN dry with some scratching ACCESS: L AVF + T/B, aneurysmal  Labs: BMET Recent Labs  Lab 10/13/20 0140 10/16/20 0136 10/17/20 0502  NA 140 141 138  K 4.5 4.4 4.3  CL 95* 96* 97*  CO2 30 27 26   GLUCOSE 84 130* 89  BUN 46* 58* 29*  CREATININE 11.73* 11.92* 7.67*  CALCIUM 8.4* 9.2 8.2*   CBC Recent Labs  Lab 10/13/20 0140 10/16/20 0136 10/17/20 0502  WBC 4.9 7.4 11.0*  NEUTROABS  --  4.9  --   HGB 9.7* 10.2* 10.1*  HCT 29.7* 31.8* 30.9*  MCV 95.5 97.2 95.4  PLT 240 231 262      Medications:    . amLODipine  10 mg Oral Daily  . aspirin EC  81 mg Oral Daily  . atorvastatin  80 mg Oral Daily  . Chlorhexidine Gluconate Cloth  6 each Topical  Q0600  . clopidogrel  75 mg Oral Daily  . heparin  5,000 Units Subcutaneous Q12H  . isosorbide mononitrate  60 mg Oral Daily  . metoprolol succinate  50 mg Oral Daily  . multivitamin  1 tablet Oral QHS  . sodium chloride flush  3 mL Intravenous Q12H     Madelon Lips, MD 10/17/2020, 10:29 AM

## 2020-10-17 NOTE — Progress Notes (Addendum)
Progress Note  Patient Name: Kenneth [name] Date of Encounter: 10/17/2020  [facility] Cardiologist: Peter [name], MD   Subjective   Feeling well. No chest pain, sob or palpitations.   Inpatient Medications    Scheduled Meds: . amLODipine  10 mg Oral Daily  . aspirin EC  81 mg Oral Daily  . atorvastatin  80 mg Oral Daily  . [START ON 10/18/2020] calcitRIOL  2.25 mcg Oral Q T,Th,Sa-HD  . Chlorhexidine Gluconate Cloth  6 each Topical Q0600  . [START ON 10/18/2020] cinacalcet  30 mg Oral Q T,Th,Sa-HD  . clopidogrel  75 mg Oral Daily  . heparin  5,000 Units Subcutaneous Q12H  . isosorbide mononitrate  60 mg Oral Daily  . metoprolol succinate  50 mg Oral Daily  . multivitamin  1 tablet Oral QHS  . sodium chloride flush  3 mL Intravenous Q12H   Continuous Infusions: . sodium chloride    . sodium chloride    . sodium chloride    . nitroGLYCERIN Stopped (10/16/20 2357)   PRN Meds: sodium chloride, sodium chloride, sodium chloride, acetaminophen **OR** acetaminophen, heparin, HYDROmorphone (DILAUDID) injection, labetalol, labetalol, lidocaine (PF), lidocaine-prilocaine, ondansetron **OR** ondansetron (ZOFRAN) IV, pentafluoroprop-tetrafluoroeth, sodium chloride flush   Vital Signs    Vitals:   10/17/20 0500 10/17/20 0600 10/17/20 0730 10/17/20 1000  BP: 117/76 135/76 136/77 (!) 149/82  Pulse: 81 69 74 78  Resp: 13 15 (!) 25   Temp:   97.9 F (36.6 C)   TempSrc:   Oral   SpO2: 91% 95%    Weight:      Height:        Intake/Output Summary (Last 24 hours) at 10/17/2020 1325 Last data filed at 10/17/2020 0358 Gross per 24 hour  Intake 148.68 ml  Output 3036 ml  Net -2887.32 ml   Last 3 Weights 10/17/2020 10/17/2020 10/16/2020  Weight (lbs) 186 lb 15.2 oz 186 lb 11.7 oz 193 lb 12.6 oz  Weight (kg) 84.8 kg 84.7 kg 87.9 kg      Telemetry    NSR - Personally Reviewed  ECG    N/A  Physical Exam   GEN: No acute distress.   Neck: No JVD Cardiac: RRR, no  murmurs, rubs, or gallops.  Respiratory: Clear to auscultation bilaterally. GI: Soft, nontender, non-distended  MS: No edema; No deformity. Neuro:  Nonfocal  Psych: Normal affect   Labs    High Sensitivity Troponin:   Recent Labs  Lab 10/03/20 0554 10/13/20 0140 10/13/20 0420 10/16/20 0818 10/16/20 0858  TROPONINIHS 4,408* 126* 131* 161* 173*      Chemistry Recent Labs  Lab 10/13/20 0140 10/16/20 0136 10/17/20 0502  NA 140 141 138  K 4.5 4.4 4.3  CL 95* 96* 97*  CO2 30 27 26   GLUCOSE 84 130* 89  BUN 46* 58* 29*  CREATININE 11.73* 11.92* 7.67*  CALCIUM 8.4* 9.2 8.2*  PROT  --  7.9  --   ALBUMIN  --  3.2*  --   AST  --  13*  --   ALT  --  10  --   ALKPHOS  --  62  --   BILITOT  --  0.8  --   GFRNONAA 4* 4* 7*  ANIONGAP 15 18* 15     Hematology Recent Labs  Lab 10/13/20 0140 10/16/20 0136 10/17/20 0502  WBC 4.9 7.4 11.0*  RBC 3.11* 3.27* 3.24*  HGB 9.7* 10.2* 10.1*  HCT 29.7* 31.8* 30.9*  MCV 95.5  97.2 95.4  MCH 31.2 31.2 31.2  MCHC 32.7 32.1 32.7  RDW 17.2* 17.0* 16.9*  PLT 240 231 262    Radiology    DG Chest 2 View  Result Date: 10/16/2020 CLINICAL DATA:  Short of breath for 1 day, history of CHF EXAM: CHEST - 2 VIEW COMPARISON:  10/16/2020 at 2 a.m. FINDINGS: Frontal and lateral views of the chest demonstrate a stable cardiac silhouette allowing for differences in positioning and technique. Since exam performed earlier today, there is increased central vascular congestion with new development of interstitial opacities and bilateral ground-glass airspace disease. No large effusion. No pneumothorax. No acute bony abnormalities. IMPRESSION: 1. Findings most consistent with pulmonary edema, which has developed in the interim since prior exam. Electronically Signed   By: Randa Ngo M.D.   On: 10/16/2020 20:38   DG Chest 2 View  Result Date: 10/16/2020 CLINICAL DATA:  Shortness of breath and wheezing. EXAM: CHEST - 2 VIEW COMPARISON:  October 02, 2020 FINDINGS: Mild diffuse chronic appearing increased interstitial lung markings are seen. There is no evidence of acute infiltrate, pleural effusion or pneumothorax. The heart size and mediastinal contours are within normal limits. There is moderate severity calcification of the aortic arch. The visualized skeletal structures are unremarkable. IMPRESSION: No acute or active cardiopulmonary disease. Electronically Signed   By: Virgina Norfolk M.D.   On: 10/16/2020 02:38   CT HEAD WO CONTRAST  Result Date: 10/16/2020 CLINICAL DATA:  Mental status change.  Unknown cause. EXAM: CT HEAD WITHOUT CONTRAST TECHNIQUE: Contiguous axial images were obtained from the base of the skull through the vertex without intravenous contrast. COMPARISON:  CT head 03/15/2018 FINDINGS: Brain: Cerebral ventricle sizes are concordant with the degree of cerebral volume loss. Patchy and confluent areas of decreased attenuation are noted throughout the deep and periventricular white matter of the cerebral hemispheres bilaterally, compatible with chronic microvascular ischemic disease. No suspicious lytic or blastic osseous lesions. No acute displaced fracture. Multilevel degenerative changes of the spine. No evidence of large-territorial acute infarction. No parenchymal hemorrhage. No mass lesion. No extra-axial collection. No mass effect or midline shift. No hydrocephalus. Basilar cisterns are patent. Vascular: No hyperdense vessel. Atherosclerotic calcifications are present within the cavernous internal carotid and bilateral vertebral arteries. Skull: No acute fracture or focal lesion. Sinuses/Orbits: Paranasal sinuses and mastoid air cells are clear. The orbits are unremarkable. Other: None. IMPRESSION: No acute intracranial abnormality. Electronically Signed   By: Iven Finn M.D.   On: 10/16/2020 20:24    Cardiac Studies   None this admission   Patient Profile     64 y.o. male with a PMH of severe multivessel CAD not  a candidate for CABG, chronic combined CHF with recovery of EF, paroxysmal atrial fibrillation not a candidate for anticoagulation due to non-compliance, HTN, HLD, ESRD on HD seen for chest pain in setting of missed dialysis.   Prior hx of chest in setting of medication non compliance and missed dialysis.   Assessment & Plan    1. Chest pain with hx of severe multivessel CAD - HS troponin 126>>131>>161>>173. Relatively low compared to last admission. Not consistent with ACS. EKG without acute ischemia (chronically abnormal).  - Patient reports resolved chest pain after dialysis >> likely culprit. - Continue ASA, Plavix, Lipitor, norvasc Imdur and Toprol XL  2. Hypertensive urgency - BP Elevated on admit - Now off nitro gtt  3. PAF - Maintaining sinus  - Not a anticoagulation candidate per prior note  Otherwise  per primary team   [facility] will sign off.   Medication Recommendations:  Continue current medications  Other recommendations (labs, testing, etc):  Per MD Follow up as an outpatient:  As scheduled   For questions or updates, please contact [facility] Please consult [email or web address] for contact info under        Signed, Leanor Kail, PA  10/17/2020, 1:25 PM    Patient seen and examined with [facility] PA.  Agree as above, with the following exceptions and changes as noted below.  Feels better after dialysis yesterday evening, blood pressure has improved. Gen: NAD, CV: RRR, soft systolic murmur, Lungs: clear, Abd: soft, Extrem: Warm, well perfused, no edema, appropriate thrill in left arm fistula, neuro/Psych: alert and oriented x 3, normal mood and affect. All available labs, radiology testing, previous records reviewed.  He has chronic chest pain that resolved after dialysis suggesting demand ischemia and chest pain as a result.  Can consider titration of his antianginal therapy such as Imdur or Toprol-XL if needed for continued chest pain.  At this time  cardiology will sign off, do not hesitate to contact us if needed.  Elouise Munroe, MD 10/17/20 2:52 PM

## 2020-10-18 DIAGNOSIS — I208 Other forms of angina pectoris: Secondary | ICD-10-CM | POA: Diagnosis not present

## 2020-10-18 LAB — RENAL FUNCTION PANEL
Albumin: 3 g/dL — ABNORMAL LOW (ref 3.5–5.0)
Anion gap: 16 — ABNORMAL HIGH (ref 5–15)
BUN: 59 mg/dL — ABNORMAL HIGH (ref 8–23)
CO2: 26 mmol/L (ref 22–32)
Calcium: 8.2 mg/dL — ABNORMAL LOW (ref 8.9–10.3)
Chloride: 98 mmol/L (ref 98–111)
Creatinine, Ser: 12.13 mg/dL — ABNORMAL HIGH (ref 0.61–1.24)
GFR, Estimated: 4 mL/min — ABNORMAL LOW (ref >60)
Glucose, Bld: 88 mg/dL (ref 70–99)
Phosphorus: 7 mg/dL — ABNORMAL HIGH (ref 2.5–4.6)
Potassium: 4.8 mmol/L (ref 3.5–5.1)
Sodium: 140 mmol/L (ref 135–145)

## 2020-10-18 LAB — CBC
HCT: 28 % — ABNORMAL LOW (ref 39.0–52.0)
Hemoglobin: 9.8 g/dL — ABNORMAL LOW (ref 13.0–17.0)
MCH: 32.9 pg (ref 26.0–34.0)
MCHC: 35 g/dL (ref 30.0–36.0)
MCV: 94 fL (ref 80.0–100.0)
Platelets: 230 10*3/uL (ref 150–400)
RBC: 2.98 MIL/uL — ABNORMAL LOW (ref 4.22–5.81)
RDW: 17.2 % — ABNORMAL HIGH (ref 11.5–15.5)
WBC: 7.2 10*3/uL (ref 4.0–10.5)
nRBC: 0 % (ref 0.0–0.2)

## 2020-10-18 MED ORDER — METOPROLOL TARTRATE 5 MG/5ML IV SOLN
5.0000 mg | Freq: Three times a day (TID) | INTRAVENOUS | Status: DC | PRN
  Administered 2020-10-19: 06:00:00 5 mg via INTRAVENOUS
  Filled 2020-10-18: qty 5

## 2020-10-18 MED ORDER — HEPARIN SODIUM (PORCINE) 1000 UNIT/ML IJ SOLN
2400.0000 [IU] | Freq: Once | INTRAMUSCULAR | Status: AC
  Filled 2020-10-18: qty 2.4

## 2020-10-18 MED ORDER — METOPROLOL TARTRATE 5 MG/5ML IV SOLN
INTRAVENOUS | Status: AC
  Administered 2020-10-18: 07:00:00 5 mg
  Filled 2020-10-18: qty 5

## 2020-10-18 MED ORDER — HEPARIN SODIUM (PORCINE) 1000 UNIT/ML IJ SOLN
INTRAMUSCULAR | Status: AC
  Administered 2020-10-18: 15:00:00 2400 [IU] via INTRAVENOUS
  Filled 2020-10-18: qty 3

## 2020-10-18 MED ORDER — METOPROLOL TARTRATE 5 MG/5ML IV SOLN: 5.0000 mg | Freq: Once | INTRAVENOUS | Status: AC

## 2020-10-18 MED ORDER — METOPROLOL SUCCINATE ER 100 MG PO TB24
100.0000 mg | ORAL_TABLET | Freq: Every day | ORAL | Status: DC
  Administered 2020-10-19: 21:00:00 50 mg via ORAL
  Administered 2020-10-19 – 2020-10-20 (×2): 100 mg via ORAL
  Filled 2020-10-18 (×2): qty 1

## 2020-10-18 NOTE — Progress Notes (Signed)
Patient with elevated HR 140'S and then Afib asymptomatic. Notified MD and gave IV metoprolol. HR still not controlled Day RN aware and will follow up.

## 2020-10-18 NOTE — Progress Notes (Signed)
PROGRESS NOTE    SHAREEF EDDINGER  QAS:[id number] DOB: August 07, 1956 DOA: 10/16/2020 PCP: [name], MD   Brief Narrative:  The patient is a 64 y.o. year-old w/ hx of MI, HTN, PEA arrest (2019), hx etoh abuse, ESRD on HD, chronic combined syst/ diast CHF and ESRD on HD TTS.  Pt arrived to ED via EMS from home reporting SOB/ wheezing and chest pain. He was given Solumedrol IV and Duoneb. No N/V or diaphoresis. Pt was admitted for earlier this month for NSTEMI.  He has known severe 3VCAD and was not felt to be a candidate for PCI per notes from cardiology July 2021 (see below) and not a CABG candidate per notes from 2019 (see below). Pt is on medical management. Troponin's here are 126 - 131 - 161 - 173. Pt rec'd 4mg  MSOr, asa 324mg  in ED. COVID pending. This is 4th admission for chest pain in the last month, prior to that was July 2021 (last heart cath).   Patient started on nitroglycerin drip.  His chest pain resolved. Nitro drip stopped on 10/17/20.    Assessment & Plan:   Active Problems:   Essential hypertension   ESRD on hemodialysis [facility])   Coronary artery disease involving native heart with angina pectoris (HCC)   Angina at rest [facility])   1. Chest pain - pt has known severe 3VCAD including L main disease, f/b cardiology. Not PCI or CABG candidate, on medical rx. Having "lots" of chest pain, gets better w/ HD usually. Trop 100's, stable here. EKG mild lateral ST depression, not new. Cont meds BB, imdur, plavix, asa. IV NTG ordered since CP worsening, repeat MSO4. IV labetalol x 1. Cards consulted.  10/17/20 Chest pain resolved with nitro drip, BP improved. S/p HD. Per Cardiology, continue medical management.  10/18/20 Patient with HR up to 140 overnight, reported chest pain then but denies chest pain at this time with HR in the 80s to 90s. Continue medical management.   2. ESRD - on HD TTS. Last HD Sat per patient.  No sig vol overload, CXR clear.  Will consult nephrology.  10/17/20  S/p HD with fluid removed, patient reports feeling better.  10/18/20 Having HD again today. Reports that his chest pain and dyspnea are improving.   3. HTN - cont BB, norvasc 4. H/o PEA arrest - in 2019 5. H/o combined syst/ diast CHF - most recent LVEF was better at 55% 6. Chronic A. Fib. With RVR overnight, improved with IV lopressor x1. Will increase metoprolol dose, add Lopressor IV as needed BP permitting.  7. Prognosis - guarded. Pt would like full code.     DVT prophylaxis: Heparin Sq Code Status: Full  Family Communication: None. Patient discussed with his bedside nurse and with his dialysis nurse.  Disposition Plan: Patient from home                               Will return home on discharge                               Anticipated dc date: 10/19/20 If HR and BP stable overnight.   Consultants:   Cardiology--signed off  Nephrology  Procedures:  None Antimicrobials:  None  Subjective: Patient seen and examined this afternoon, receiving dialysis. Reports no chest pain or palpitations. His dyspnea is resolving.   Objective: Vitals:  10/18/20 1500 10/18/20 1530 10/18/20 1600 10/18/20 1630  BP: (!) 118/94 (!) 148/103 135/66 (!) 147/80  Pulse:      Resp: 11 20 16 20   Temp:      TempSrc:      SpO2:      Weight:      Height:       No intake or output data in the 24 hours ending 10/18/20 1653 Filed Weights   10/17/20 0358 10/17/20 0426 10/18/20 1420  Weight: 84.7 kg 84.8 kg 86.1 kg    Examination:  General exam: Appears calm and comfortable  Respiratory system:No cough. Respiratory effort normal. Cardiovascular system: S1 & S2 heard, iRiRR.  Gastrointestinal system: Abdomen is nondistended, soft and nontender. Central nervous system: Alert and oriented. No focal neurological deficits. Extremities: Symmetric 5 x 5 power. Skin: No rashes, lesions or ulcers Psychiatry: Judgement and insight appear normal. Mood & affect appropriate.     Data Reviewed: I  have personally reviewed following labs   CBC: Recent Labs  Lab 10/13/20 0140 10/16/20 0136 10/17/20 0502 10/18/20 1405  WBC 4.9 7.4 11.0* 7.2  NEUTROABS  --  4.9  --   --   HGB 9.7* 10.2* 10.1* 9.8*  HCT 29.7* 31.8* 30.9* 28.0*  MCV 95.5 97.2 95.4 94.0  PLT 240 231 262 782   Basic Metabolic Panel: Recent Labs  Lab 10/13/20 0140 10/16/20 0136 10/17/20 0502 10/18/20 1405  NA 140 141 138 140  K 4.5 4.4 4.3 4.8  CL 95* 96* 97* 98  CO2 30 27 26 26   GLUCOSE 84 130* 89 88  BUN 46* 58* 29* 59*  CREATININE 11.73* 11.92* 7.67* 12.13*  CALCIUM 8.4* 9.2 8.2* 8.2*  PHOS  --   --   --  7.0*   GFR: Estimated Creatinine Clearance: 6.2 mL/min (A) (by C-G formula based on SCr of 12.13 mg/dL (H)). Liver Function Tests: Recent Labs  Lab 10/16/20 0136 10/18/20 1405  AST 13*  --   ALT 10  --   ALKPHOS 62  --   BILITOT 0.8  --   PROT 7.9  --   ALBUMIN 3.2* 3.0*   No results for input(s): LIPASE, AMYLASE in the last 168 hours. No results for input(s): AMMONIA in the last 168 hours. Coagulation Profile: No results for input(s): INR, PROTIME in the last 168 hours. Cardiac Enzymes: No results for input(s): CKTOTAL, CKMB, CKMBINDEX, TROPONINI in the last 168 hours. BNP (last 3 results) No results for input(s): PROBNP in the last 8760 hours. HbA1C: No results for input(s): HGBA1C in the last 72 hours. CBG: No results for input(s): GLUCAP in the last 168 hours. Lipid Profile: No results for input(s): CHOL, HDL, LDLCALC, TRIG, CHOLHDL, LDLDIRECT in the last 72 hours. Thyroid Function Tests: No results for input(s): TSH, T4TOTAL, FREET4, T3FREE, THYROIDAB in the last 72 hours. Anemia Panel: No results for input(s): VITAMINB12, FOLATE, FERRITIN, TIBC, IRON, RETICCTPCT in the last 72 hours. Sepsis Labs: No results for input(s): PROCALCITON, LATICACIDVEN in the last 168 hours.  Recent Results (from the past 240 hour(s))  Resp Panel by RT-PCR (Flu A&B, Covid) Nasopharyngeal Swab      Status: None   Collection Time: 10/16/20  1:10 PM   Specimen: Nasopharyngeal Swab; Nasopharyngeal(NP) swabs in vial transport medium  Result Value Ref Range Status   SARS Coronavirus 2 by RT PCR NEGATIVE NEGATIVE Final    Comment: (NOTE) SARS-CoV-2 target nucleic acids are NOT DETECTED.  The SARS-CoV-2 RNA is generally detectable in  upper respiratory specimens during the acute phase of infection. The lowest concentration of SARS-CoV-2 viral copies this assay can detect is 138 copies/mL. A negative result does not preclude SARS-Cov-2 infection and should not be used as the sole basis for treatment or other patient management decisions. A negative result may occur with  improper specimen collection/handling, submission of specimen other than nasopharyngeal swab, presence of viral mutation(s) within the areas targeted by this assay, and inadequate number of viral copies(<138 copies/mL). A negative result must be combined with clinical observations, patient history, and epidemiological information. The expected result is Negative.  Fact Sheet for Patients:  EntrepreneurPulse.com.au  Fact Sheet for Healthcare Providers:  IncredibleEmployment.be  This test is no t yet approved or cleared by the [place] FDA and  has been authorized for detection and/or diagnosis of SARS-CoV-2 by FDA under an Emergency Use Authorization (EUA). This EUA will remain  in effect (meaning this test can be used) for the duration of the COVID-19 declaration under Section 564(b)(1) of the Act, 21 U.S.C.section 360bbb-3(b)(1), unless the authorization is terminated  or revoked sooner.       Influenza A by PCR NEGATIVE NEGATIVE Final   Influenza B by PCR NEGATIVE NEGATIVE Final    Comment: (NOTE) The Xpert Xpress SARS-CoV-2/FLU/RSV plus assay is intended as an aid in the diagnosis of influenza from Nasopharyngeal swab specimens and should not be used as a sole basis  for treatment. Nasal washings and aspirates are unacceptable for Xpert Xpress SARS-CoV-2/FLU/RSV testing.  Fact Sheet for Patients: EntrepreneurPulse.com.au  Fact Sheet for Healthcare Providers: IncredibleEmployment.be  This test is not yet approved or cleared by the [place] FDA and has been authorized for detection and/or diagnosis of SARS-CoV-2 by FDA under an Emergency Use Authorization (EUA). This EUA will remain in effect (meaning this test can be used) for the duration of the COVID-19 declaration under Section 564(b)(1) of the Act, 21 U.S.C. section 360bbb-3(b)(1), unless the authorization is terminated or revoked.  Performed at [facility], [place] [address] [place], [place] [address]          Radiology Studies: DG Chest 2 View  Result Date: 10/16/2020 CLINICAL DATA:  Short of breath for 1 day, history of CHF EXAM: CHEST - 2 VIEW COMPARISON:  10/16/2020 at 2 a.m. FINDINGS: Frontal and lateral views of the chest demonstrate a stable cardiac silhouette allowing for differences in positioning and technique. Since exam performed earlier today, there is increased central vascular congestion with new development of interstitial opacities and bilateral ground-glass airspace disease. No large effusion. No pneumothorax. No acute bony abnormalities. IMPRESSION: 1. Findings most consistent with pulmonary edema, which has developed in the interim since prior exam. Electronically Signed   By: Randa Ngo M.D.   On: 10/16/2020 20:38   CT HEAD WO CONTRAST  Result Date: 10/16/2020 CLINICAL DATA:  Mental status change.  Unknown cause. EXAM: CT HEAD WITHOUT CONTRAST TECHNIQUE: Contiguous axial images were obtained from the base of the skull through the vertex without intravenous contrast. COMPARISON:  CT head 03/15/2018 FINDINGS: Brain: Cerebral ventricle sizes are concordant with the degree of cerebral volume loss. Patchy and confluent areas  of decreased attenuation are noted throughout the deep and periventricular white matter of the cerebral hemispheres bilaterally, compatible with chronic microvascular ischemic disease. No suspicious lytic or blastic osseous lesions. No acute displaced fracture. Multilevel degenerative changes of the spine. No evidence of large-territorial acute infarction. No parenchymal hemorrhage. No mass lesion. No extra-axial collection. No mass  effect or midline shift. No hydrocephalus. Basilar cisterns are patent. Vascular: No hyperdense vessel. Atherosclerotic calcifications are present within the cavernous internal carotid and bilateral vertebral arteries. Skull: No acute fracture or focal lesion. Sinuses/Orbits: Paranasal sinuses and mastoid air cells are clear. The orbits are unremarkable. Other: None. IMPRESSION: No acute intracranial abnormality. Electronically Signed   By: Iven Finn M.D.   On: 10/16/2020 20:24        Scheduled Meds:  amLODipine  10 mg Oral Daily   aspirin EC  81 mg Oral Daily   atorvastatin  80 mg Oral Daily   calcitRIOL  2.25 mcg Oral Q T,Th,Sa-HD   Chlorhexidine Gluconate Cloth  6 each Topical Q0600   cinacalcet  30 mg Oral Q T,Th,Sa-HD   clopidogrel  75 mg Oral Daily   heparin  5,000 Units Subcutaneous Q12H   isosorbide mononitrate  60 mg Oral Daily   metoprolol succinate  50 mg Oral Daily   multivitamin  1 tablet Oral QHS   sodium chloride flush  3 mL Intravenous Q12H   Continuous Infusions:  sodium chloride     sodium chloride     sodium chloride     nitroGLYCERIN Stopped (10/16/20 2357)     LOS: 2 days    Time spent: 25 minutes    Blain Pais, MD [facility]   If 7PM-7AM, please contact night-coverage [email or web address] Password [facility] 10/18/2020, 4:53 PM

## 2020-10-18 NOTE — Progress Notes (Signed)
  [facility] Progress Note   Assessment/ Plan:    Dialyzes at [facility] TTS EDW 84 kg 2 K/ 2Ca  F180 BFR 400 DFR 800 No UF profile Heparin 2400 u bolus Sensipar 30 TIW Mircera 100 q 2 weeks  Calcitriol 2.25 mcg TIW  Assessment/Plan: 1 CP/SOB: trops slightly up, EKG with some ST depressions.  Cardiology has seen and has signed off.  Lesions historically not amenable to PCI or CABG, medically managed to date 2.  Hypertensive urgency: improved with UF and resumption of PO meds 3 ESRD: TTS [facility]- next HD planned for today 12/23. 4. Anemia of ESRD: Hgb 10.2, no ESA needed at this time 5. Metabolic Bone Disease: sensipar and calcitriol, fosrenol is binder 6.  CHF with EF 30-35%--> improved to 55-60% 04/2020 7.  EtOH abuse 8.  Afib: on metop 9.  Dispo: pending  Subjective:    Having some Afib with HR up to 140s.  Getting IV metop pushes.  Has some CP 3/10 today.     Objective:   BP 115/85 (BP Location: Right Arm)   Pulse 100   Temp 98.4 F (36.9 C) (Oral)   Resp 10   Ht 5\' 10"  (1.778 m)   Wt 84.8 kg   SpO2 97%   BMI 26.82 kg/m   Physical Exam: GEN lying in bed, NAD HEENT EOMI PERRL, wearing Cornelius O2 NECK  no JVD PULM wheezing improved CV RRR no m/r/g ABD soft  EXT trace LE edema NEURO AAO x 3 nonfocal SKIN dry with some scratching ACCESS: L AVF + T/B, aneurysmal  Labs: BMET Recent Labs  Lab 10/13/20 0140 10/16/20 0136 10/17/20 0502  NA 140 141 138  K 4.5 4.4 4.3  CL 95* 96* 97*  CO2 30 27 26   GLUCOSE 84 130* 89  BUN 46* 58* 29*  CREATININE 11.73* 11.92* 7.67*  CALCIUM 8.4* 9.2 8.2*   CBC Recent Labs  Lab 10/13/20 0140 10/16/20 0136 10/17/20 0502  WBC 4.9 7.4 11.0*  NEUTROABS  --  4.9  --   HGB 9.7* 10.2* 10.1*  HCT 29.7* 31.8* 30.9*  MCV 95.5 97.2 95.4  PLT 240 231 262      Medications:    . amLODipine  10 mg Oral Daily  . aspirin EC  81 mg Oral Daily  . atorvastatin  80 mg Oral Daily  . calcitRIOL  2.25 mcg Oral Q T,Th,Sa-HD  .  Chlorhexidine Gluconate Cloth  6 each Topical Q0600  . cinacalcet  30 mg Oral Q T,Th,Sa-HD  . clopidogrel  75 mg Oral Daily  . heparin  5,000 Units Subcutaneous Q12H  . isosorbide mononitrate  60 mg Oral Daily  . metoprolol succinate  50 mg Oral Daily  . multivitamin  1 tablet Oral QHS  . sodium chloride flush  3 mL Intravenous Q12H     Madelon Lips, MD 10/18/2020, 10:30 AM

## 2020-10-19 DIAGNOSIS — I10 Essential (primary) hypertension: Secondary | ICD-10-CM

## 2020-10-19 DIAGNOSIS — I4891 Unspecified atrial fibrillation: Secondary | ICD-10-CM

## 2020-10-19 LAB — CBC
HCT: 36.3 % — ABNORMAL LOW (ref 39.0–52.0)
Hemoglobin: 11.7 g/dL — ABNORMAL LOW (ref 13.0–17.0)
MCH: 31.5 pg (ref 26.0–34.0)
MCHC: 32.2 g/dL (ref 30.0–36.0)
MCV: 97.6 fL (ref 80.0–100.0)
Platelets: 283 10*3/uL (ref 150–400)
RBC: 3.72 MIL/uL — ABNORMAL LOW (ref 4.22–5.81)
RDW: 17.1 % — ABNORMAL HIGH (ref 11.5–15.5)
WBC: 9.7 10*3/uL (ref 4.0–10.5)
nRBC: 0 % (ref 0.0–0.2)

## 2020-10-19 LAB — RENAL FUNCTION PANEL
Albumin: 3.3 g/dL — ABNORMAL LOW (ref 3.5–5.0)
Anion gap: 17 — ABNORMAL HIGH (ref 5–15)
BUN: 29 mg/dL — ABNORMAL HIGH (ref 8–23)
CO2: 23 mmol/L (ref 22–32)
Calcium: 8.9 mg/dL (ref 8.9–10.3)
Chloride: 97 mmol/L — ABNORMAL LOW (ref 98–111)
Creatinine, Ser: 7.83 mg/dL — ABNORMAL HIGH (ref 0.61–1.24)
GFR, Estimated: 7 mL/min — ABNORMAL LOW (ref >60)
Glucose, Bld: 96 mg/dL (ref 70–99)
Phosphorus: 6 mg/dL — ABNORMAL HIGH (ref 2.5–4.6)
Potassium: 5.4 mmol/L — ABNORMAL HIGH (ref 3.5–5.1)
Sodium: 137 mmol/L (ref 135–145)

## 2020-10-19 MED ORDER — NITROGLYCERIN 0.4 MG SL SUBL
SUBLINGUAL_TABLET | SUBLINGUAL | Status: AC
  Administered 2020-10-19: 07:00:00 0.4 mg
  Filled 2020-10-19: qty 1

## 2020-10-19 MED ORDER — PATIROMER SORBITEX CALCIUM 8.4 G PO PACK
16.8000 g | PACK | Freq: Once | ORAL | Status: AC
  Administered 2020-10-19: 18:00:00 16.8 g via ORAL
  Filled 2020-10-19: qty 2

## 2020-10-19 MED ORDER — METOPROLOL SUCCINATE ER 50 MG PO TB24
50.0000 mg | ORAL_TABLET | Freq: Every day | ORAL | Status: DC
Start: 2020-10-19 — End: 2020-10-20
  Filled 2020-10-19: qty 1

## 2020-10-19 NOTE — Progress Notes (Signed)
Patient has uncontrolled rhythm and heart rate PRN IV metoprolol given not much improvement. Patient now c/o of chest pain 5/10 to left side of chest. NTG SL given x 2 free of chest pain continues to be Afib.

## 2020-10-19 NOTE — Progress Notes (Signed)
Patient ID: Kenneth [name], male   DOB: 05-30-1956, 64 y.o.   MRN: [id number]  [facility] Progress Note   Assessment/ Plan:   1.  Chest pain/shortness of breath: With diffuse coronary artery disease not amenable to PCI or CABG and recommendations noted for going medical management per cardiology.  Recurrent chest pain this morning prompting reevaluation of ACS. 2. ESRD: He undergoes hemodialysis on a TTS schedule as an outpatient and underwent hemodialysis yesterday-next treatment due on Sunday per holiday schedule. 3. Anemia: Borderline hemoglobin and hematocrit noted, no overt loss.  Continue ESA. 4. CKD-MBD: Sensipar and calcitriol for PTH control.  Significantly elevated phosphorus level on labs yesterday-continue Fosrenol and renal diet. 5.  Atrial fibrillation with rapid ventricular response: Difficulty with rate control noted overnight with need for as needed metoprolol.  At his current blood pressures, would favor a change from amlodipine to diltiazem for synergistic negative chronotropic effect. 6. Hypertension: Patient noted to be intermittently elevated, monitor on ongoing antihypertensive therapy/hemodialysis per  Subjective:   Cardio noted to be tachycardic with associated palpitations and some left-sided chest pain.   Objective:   BP (!) 118/102 (BP Location: Right Arm)   Pulse (!) 110   Temp 97.6 F (36.4 C) (Oral)   Resp 16   Ht 5\' 10"  (1.778 m)   Wt 81.5 kg   SpO2 (!) 73%   BMI 25.78 kg/m   Physical Exam: Gen: Appears comfortable sitting on the side of his bed, speaking on phone. CVS: Pulse irregularly irregular tachycardia, S1 and S2 normal Resp: Clear to auscultation bilaterally, no distinct rales or rhonchi Abd: Soft, flat, nontender, bowel sounds normal Ext: No lower extremity edema, left upper arm AV fistula with intact dressings.  Labs: BMET Recent Labs  Lab 10/13/20 0140 10/16/20 0136 10/17/20 0502 10/18/20 1405  NA 140 141 138 140  K  4.5 4.4 4.3 4.8  CL 95* 96* 97* 98  CO2 30 27 26 26   GLUCOSE 84 130* 89 88  BUN 46* 58* 29* 59*  CREATININE 11.73* 11.92* 7.67* 12.13*  CALCIUM 8.4* 9.2 8.2* 8.2*  PHOS  --   --   --  7.0*   CBC Recent Labs  Lab 10/13/20 0140 10/16/20 0136 10/17/20 0502 10/18/20 1405  WBC 4.9 7.4 11.0* 7.2  NEUTROABS  --  4.9  --   --   HGB 9.7* 10.2* 10.1* 9.8*  HCT 29.7* 31.8* 30.9* 28.0*  MCV 95.5 97.2 95.4 94.0  PLT 240 231 262 230     Medications:    . amLODipine  10 mg Oral Daily  . aspirin EC  81 mg Oral Daily  . atorvastatin  80 mg Oral Daily  . calcitRIOL  2.25 mcg Oral Q T,Th,Sa-HD  . Chlorhexidine Gluconate Cloth  6 each Topical Q0600  . cinacalcet  30 mg Oral Q T,Th,Sa-HD  . clopidogrel  75 mg Oral Daily  . heparin  5,000 Units Subcutaneous Q12H  . isosorbide mononitrate  60 mg Oral Daily  . metoprolol succinate  100 mg Oral Daily  . multivitamin  1 tablet Oral QHS  . sodium chloride flush  3 mL Intravenous Q12H   Elmarie Shiley, MD 10/19/2020, 8:45 AM

## 2020-10-19 NOTE — Progress Notes (Signed)
Progress Note   Subjective   Doing well today, the patient denies CP or SOB.  No new concerns.  Some chest pain earlier with AF with RVR.  Inpatient Medications    Scheduled Meds: . amLODipine  10 mg Oral Daily  . aspirin EC  81 mg Oral Daily  . atorvastatin  80 mg Oral Daily  . calcitRIOL  2.25 mcg Oral Q T,Th,Sa-HD  . Chlorhexidine Gluconate Cloth  6 each Topical Q0600  . cinacalcet  30 mg Oral Q T,Th,Sa-HD  . clopidogrel  75 mg Oral Daily  . heparin  5,000 Units Subcutaneous Q12H  . isosorbide mononitrate  60 mg Oral Daily  . metoprolol succinate  100 mg Oral Daily  . multivitamin  1 tablet Oral QHS  . sodium chloride flush  3 mL Intravenous Q12H   Continuous Infusions: . sodium chloride    . sodium chloride    . sodium chloride    . nitroGLYCERIN Stopped (10/16/20 2357)   PRN Meds: sodium chloride, sodium chloride, sodium chloride, acetaminophen **OR** acetaminophen, heparin, HYDROmorphone (DILAUDID) injection, lidocaine (PF), lidocaine-prilocaine, metoprolol tartrate, ondansetron **OR** ondansetron (ZOFRAN) IV, pentafluoroprop-tetrafluoroeth, sodium chloride flush   Vital Signs    Vitals:   10/19/20 0655 10/19/20 0700 10/19/20 0745 10/19/20 0800  BP: 103/61 (!) 89/56 (!) 118/102 (!) 94/56  Pulse: 60 73 (!) 110 93  Resp: 20 19 16 20   Temp:   97.6 F (36.4 C)   TempSrc:   Oral   SpO2: 93% 95% (!) 73% (!) 77%  Weight:      Height:        Intake/Output Summary (Last 24 hours) at 10/19/2020 1122 Last data filed at 10/19/2020 0900 Gross per 24 hour  Intake 240 ml  Output 3500 ml  Net -3260 ml   Filed Weights   10/18/20 1420 10/18/20 1831 10/19/20 0558  Weight: 86.1 kg 82.4 kg 81.5 kg    Telemetry    afib with RVR - Personally Reviewed  Physical Exam   GEN- The patient is chronically ill appearing, alert and oriented x 3 today.  disheveled  Head- normocephalic, atraumatic Eyes-  Sclera clear, conjunctiva pink Ears- hearing intact Oropharynx-  clear Neck- supple, Lungs-  normal work of breathing Heart- irregular rate and rhythm  GI- soft  Extremities- no clubbing, cyanosis, or edema  MS- no significant deformity or atrophy Skin- no rash or lesion Psych- euthymic mood, full affect Neuro- strength and sensation are intact   Labs    Chemistry Recent Labs  Lab 10/16/20 0136 10/17/20 0502 10/18/20 1405 10/19/20 0815  NA 141 138 140 137  K 4.4 4.3 4.8 5.4*  CL 96* 97* 98 97*  CO2 27 26 26 23   GLUCOSE 130* 89 88 96  BUN 58* 29* 59* 29*  CREATININE 11.92* 7.67* 12.13* 7.83*  CALCIUM 9.2 8.2* 8.2* 8.9  PROT 7.9  --   --   --   ALBUMIN 3.2*  --  3.0* 3.3*  AST 13*  --   --   --   ALT 10  --   --   --   ALKPHOS 62  --   --   --   BILITOT 0.8  --   --   --   GFRNONAA 4* 7* 4* 7*  ANIONGAP 18* 15 16* 17*     Hematology Recent Labs  Lab 10/17/20 0502 10/18/20 1405 10/19/20 0815  WBC 11.0* 7.2 9.7  RBC 3.24* 2.98* 3.72*  HGB 10.1* 9.8* 11.7*  HCT 30.9* 28.0* 36.3*  MCV 95.4 94.0 97.6  MCH 31.2 32.9 31.5  MCHC 32.7 35.0 32.2  RDW 16.9* 17.2* 17.1*  PLT 262 230 283     Patient ID  64 y.o. male with a PMH of severe multivessel CAD not a candidate for CABG, chronic combined CHF with recovery of EF, persistent atrial fibrillation not a candidate for anticoagulation due to non-compliance, HTN, HLD, ESRD on HD seen for chest pain in setting of missed dialysis.   Prior hx of chest in setting of medication non compliance and missed dialysis.   Assessment & Plan    1.  CAD Clinically stable Limited by noncompliance Not a candidate for revascularization Continue current medical therapy  2. Persistent afib Now in afib Not a candidate for [facility] therapy due to noncompliance Medical therapy in general has been limited due to noncompliance We could increase toprol to 100mg  qam and 50mg  qpm if BP allows (may require stopping amlodipine) Not a candidate for AADs or EP procedures  3. HTN BP is low now that he is  taking his medicines May need to reduce amlodipine if BP stays low.  General cardiology to follow  Thompson Grayer MD, [facility] 10/19/2020 11:22 AM

## 2020-10-19 NOTE — Progress Notes (Signed)
PROGRESS NOTE    Kenneth [name]  PXT:[id number] DOB: 1956/05/09 DOA: 10/16/2020 PCP: [name], MD   Brief Narrative:  The patient is a 64 y.o. year-old w/ hx of MI, HTN, PEA arrest (2019), hx etoh abuse, ESRD on HD, chronic combined syst/ diast CHF and ESRD on HD TTS.  Pt arrived to ED via EMS from home reporting SOB/ wheezing and chest pain. He was given Solumedrol IV and Duoneb. No N/V or diaphoresis. Pt was admitted for earlier this month for NSTEMI.  He has known severe 3VCAD and was not felt to be a candidate for PCI per notes from cardiology July 2021 (see below) and not a CABG candidate per notes from 2019 (see below). Pt is on medical management. Troponin's here are 126 - 131 - 161 - 173. Pt rec'd 4mg  MSOr, asa 324mg  in ED. COVID pending. This is 4th admission for chest pain in the last month, prior to that was July 2021 (last heart cath).   Patient started on nitroglycerin drip.  His chest pain resolved. Nitro drip stopped on 10/17/20.  12/24 Brief recurrent chest pain after A. Fib HR up, cp relieved by [employer] x2. BP soft afterwards but no recurrent chest pain. Lopressor IV with some improvement of HR, started on 100mg  po toprol in am, per Cardiology, will have additional 50mg  po toprol in pm.    Assessment & Plan:   Active Problems:   Essential hypertension   ESRD on hemodialysis [facility])   Coronary artery disease involving native heart with angina pectoris (HCC)   Angina at rest [facility])   1. Chest pain - pt has known severe 3VCAD including [place] disease, f/b cardiology. Not PCI or CABG candidate, on medical rx. Having "lots" of chest pain, gets better w/ HD usually. Trop 100's, stable here. EKG mild lateral ST depression, not new. Cont meds BB, imdur, plavix, asa. IV NTG ordered since CP worsening, repeat MSO4. IV labetalol x 1. Cards consulted.  10/17/20 Chest pain resolved with nitro drip, BP improved. S/p HD. Per Cardiology, continue medical management.  10/18/20 Patient  with HR up to 140 overnight, reported chest pain then but denies chest pain at this time with HR in the 80s to 90s. Continue medical management.  12/24 Had recurrent chest pain with elevated HR. Will increase toprol dose to improve HR control and minimized chest pain. Cardiology reconsulted today and recommend continue medication management.  2. ESRD - on HD TTS. Last HD Sat per patient.  No sig vol overload, CXR clear.  Will consult nephrology.  10/17/20 S/p HD with fluid removed, patient reports feeling better.  10/18/20 Having HD again today. Reports that his chest pain and dyspnea are improving.  12/24 Will have HD again on Sunday.   3. HTN - cont BB, norvasc--12/24 Will dc Norvasc due to low BP and to allow for increase in BB dosing.  4. H/o PEA arrest - in 2019 5. H/o combined syst/ diast CHF - most recent LVEF was better at 55% 6. Chronic A. Fib. With RVR overnight, improved with IV lopressor x1. Will increase metoprolol dose, add Lopressor IV as needed BP permitting.  12/24 With HR up again overnight, improving with increased dose of toprol (was on 50mg  daily at home, increased to 100mg  in am and 50mg  in pm). Not a candidate for anticoagulation per Cardiology due to the patient's history of non adherence to medication regimen.  7. Prognosis - guarded. Pt would like full code.  DVT prophylaxis: Heparin Sq Code Status: Full  Family Communication: None. Patient discussed with his bedside nurse and consultant team.  Disposition Plan: Patient from home                               Will return home on discharge                               Anticipated dc date: 10/21/20 If HR and BP stable. Will have HD on 12/26.   Consultants:   Cardiology  Nephrology  Procedures:  None Antimicrobials:  None  Subjective: Patient seen and examined this morning. Had no recurrent chest pain after receiving SL nitro. HR improved but still above 100.   Objective: Vitals:   10/19/20 0700  10/19/20 0745 10/19/20 0800 10/19/20 1216  BP: (!) 89/56 (!) 118/102 (!) 94/56 128/77  Pulse: 73 (!) 110 93 (!) 109  Resp: 19 16 20 19   Temp:  97.6 F (36.4 C)  97.6 F (36.4 C)  TempSrc:  Oral  Oral  SpO2: 95% (!) 73% (!) 77% 92%  Weight:      Height:        Intake/Output Summary (Last 24 hours) at 10/19/2020 1636 Last data filed at 10/19/2020 1300 Gross per 24 hour  Intake 480 ml  Output 3500 ml  Net -3020 ml   Filed Weights   10/18/20 1420 10/18/20 1831 10/19/20 0558  Weight: 86.1 kg 82.4 kg 81.5 kg    Examination:  General exam: Appears calm and comfortable  Respiratory system:No cough. Respiratory effort normal. Cardiovascular system: S1 & S2 heard, iRiRR.  Gastrointestinal system: Abdomen is nondistended, soft and nontender. Central nervous system: Alert and oriented. No focal neurological deficits. Extremities: Symmetric 5 x 5 power. Skin: No rashes, lesions or ulcers Psychiatry: Judgement and insight appear normal. Mood & affect appropriate.     Data Reviewed: I have personally reviewed following labs   CBC: Recent Labs  Lab 10/13/20 0140 10/16/20 0136 10/17/20 0502 10/18/20 1405 10/19/20 0815  WBC 4.9 7.4 11.0* 7.2 9.7  NEUTROABS  --  4.9  --   --   --   HGB 9.7* 10.2* 10.1* 9.8* 11.7*  HCT 29.7* 31.8* 30.9* 28.0* 36.3*  MCV 95.5 97.2 95.4 94.0 97.6  PLT 240 231 262 230 818   Basic Metabolic Panel: Recent Labs  Lab 10/13/20 0140 10/16/20 0136 10/17/20 0502 10/18/20 1405 10/19/20 0815  NA 140 141 138 140 137  K 4.5 4.4 4.3 4.8 5.4*  CL 95* 96* 97* 98 97*  CO2 30 27 26 26 23   GLUCOSE 84 130* 89 88 96  BUN 46* 58* 29* 59* 29*  CREATININE 11.73* 11.92* 7.67* 12.13* 7.83*  CALCIUM 8.4* 9.2 8.2* 8.2* 8.9  PHOS  --   --   --  7.0* 6.0*   GFR: Estimated Creatinine Clearance: 9.6 mL/min (A) (by C-G formula based on SCr of 7.83 mg/dL (H)). Liver Function Tests: Recent Labs  Lab 10/16/20 0136 10/18/20 1405 10/19/20 0815  AST 13*  --   --    ALT 10  --   --   ALKPHOS 62  --   --   BILITOT 0.8  --   --   PROT 7.9  --   --   ALBUMIN 3.2* 3.0* 3.3*   No results for input(s): LIPASE, AMYLASE in the last 168  hours. No results for input(s): AMMONIA in the last 168 hours. Coagulation Profile: No results for input(s): INR, PROTIME in the last 168 hours. Cardiac Enzymes: No results for input(s): CKTOTAL, CKMB, CKMBINDEX, TROPONINI in the last 168 hours. BNP (last 3 results) No results for input(s): PROBNP in the last 8760 hours. HbA1C: No results for input(s): HGBA1C in the last 72 hours. CBG: No results for input(s): GLUCAP in the last 168 hours. Lipid Profile: No results for input(s): CHOL, HDL, LDLCALC, TRIG, CHOLHDL, LDLDIRECT in the last 72 hours. Thyroid Function Tests: No results for input(s): TSH, T4TOTAL, FREET4, T3FREE, THYROIDAB in the last 72 hours. Anemia Panel: No results for input(s): VITAMINB12, FOLATE, FERRITIN, TIBC, IRON, RETICCTPCT in the last 72 hours. Sepsis Labs: No results for input(s): PROCALCITON, LATICACIDVEN in the last 168 hours.  Recent Results (from the past 240 hour(s))  Resp Panel by RT-PCR (Flu A&B, Covid) Nasopharyngeal Swab     Status: None   Collection Time: 10/16/20  1:10 PM   Specimen: Nasopharyngeal Swab; Nasopharyngeal(NP) swabs in vial transport medium  Result Value Ref Range Status   SARS Coronavirus 2 by RT PCR NEGATIVE NEGATIVE Final    Comment: (NOTE) SARS-CoV-2 target nucleic acids are NOT DETECTED.  The SARS-CoV-2 RNA is generally detectable in upper respiratory specimens during the acute phase of infection. The lowest concentration of SARS-CoV-2 viral copies this assay can detect is 138 copies/mL. A negative result does not preclude SARS-Cov-2 infection and should not be used as the sole basis for treatment or other patient management decisions. A negative result may occur with  improper specimen collection/handling, submission of specimen other than nasopharyngeal  swab, presence of viral mutation(s) within the areas targeted by this assay, and inadequate number of viral copies(<138 copies/mL). A negative result must be combined with clinical observations, patient history, and epidemiological information. The expected result is Negative.  Fact Sheet for Patients:  EntrepreneurPulse.com.au  Fact Sheet for Healthcare Providers:  IncredibleEmployment.be  This test is no t yet approved or cleared by the [place] FDA and  has been authorized for detection and/or diagnosis of SARS-CoV-2 by FDA under an Emergency Use Authorization (EUA). This EUA will remain  in effect (meaning this test can be used) for the duration of the COVID-19 declaration under Section 564(b)(1) of the Act, 21 U.S.C.section 360bbb-3(b)(1), unless the authorization is terminated  or revoked sooner.       Influenza A by PCR NEGATIVE NEGATIVE Final   Influenza B by PCR NEGATIVE NEGATIVE Final    Comment: (NOTE) The Xpert Xpress SARS-CoV-2/FLU/RSV plus assay is intended as an aid in the diagnosis of influenza from Nasopharyngeal swab specimens and should not be used as a sole basis for treatment. Nasal washings and aspirates are unacceptable for Xpert Xpress SARS-CoV-2/FLU/RSV testing.  Fact Sheet for Patients: EntrepreneurPulse.com.au  Fact Sheet for Healthcare Providers: IncredibleEmployment.be  This test is not yet approved or cleared by the [place] FDA and has been authorized for detection and/or diagnosis of SARS-CoV-2 by FDA under an Emergency Use Authorization (EUA). This EUA will remain in effect (meaning this test can be used) for the duration of the COVID-19 declaration under Section 564(b)(1) of the Act, 21 U.S.C. section 360bbb-3(b)(1), unless the authorization is terminated or revoked.  Performed at [facility], [place] [address] [place], [place] [address]           Radiology Studies: No results found.      Scheduled Meds:  aspirin EC  81 mg  Oral Daily   atorvastatin  80 mg Oral Daily   calcitRIOL  2.25 mcg Oral Q T,Th,Sa-HD   Chlorhexidine Gluconate Cloth  6 each Topical Q0600   cinacalcet  30 mg Oral Q T,Th,Sa-HD   clopidogrel  75 mg Oral Daily   heparin  5,000 Units Subcutaneous Q12H   isosorbide mononitrate  60 mg Oral Daily   metoprolol succinate  100 mg Oral Daily   metoprolol succinate  50 mg Oral Q2200   multivitamin  1 tablet Oral QHS   sodium chloride flush  3 mL Intravenous Q12H   Continuous Infusions:  sodium chloride     sodium chloride     sodium chloride     nitroGLYCERIN Stopped (10/16/20 2357)     LOS: 3 days    Time spent: 35 minutes    Blain Pais, MD [facility]   If 7PM-7AM, please contact night-coverage [email or web address] Password [facility] 10/19/2020, 4:36 PM

## 2020-10-20 DIAGNOSIS — I10 Essential (primary) hypertension: Secondary | ICD-10-CM | POA: Diagnosis not present

## 2020-10-20 DIAGNOSIS — N186 End stage renal disease: Secondary | ICD-10-CM | POA: Diagnosis not present

## 2020-10-20 DIAGNOSIS — R778 Other specified abnormalities of plasma proteins: Secondary | ICD-10-CM | POA: Diagnosis not present

## 2020-10-20 DIAGNOSIS — R079 Chest pain, unspecified: Secondary | ICD-10-CM | POA: Diagnosis not present

## 2020-10-20 LAB — RENAL FUNCTION PANEL
Albumin: 3.3 g/dL — ABNORMAL LOW (ref 3.5–5.0)
Anion gap: 16 — ABNORMAL HIGH (ref 5–15)
BUN: 40 mg/dL — ABNORMAL HIGH (ref 8–23)
CO2: 25 mmol/L (ref 22–32)
Calcium: 9.1 mg/dL (ref 8.9–10.3)
Chloride: 95 mmol/L — ABNORMAL LOW (ref 98–111)
Creatinine, Ser: 9.86 mg/dL — ABNORMAL HIGH (ref 0.61–1.24)
GFR, Estimated: 5 mL/min — ABNORMAL LOW (ref >60)
Glucose, Bld: 115 mg/dL — ABNORMAL HIGH (ref 70–99)
Phosphorus: 7.8 mg/dL — ABNORMAL HIGH (ref 2.5–4.6)
Potassium: 4.4 mmol/L (ref 3.5–5.1)
Sodium: 136 mmol/L (ref 135–145)

## 2020-10-20 MED ORDER — AMLODIPINE BESYLATE 5 MG PO TABS
5.0000 mg | ORAL_TABLET | Freq: Every day | ORAL | Status: DC
  Administered 2020-10-20: 11:00:00 5 mg via ORAL
  Filled 2020-10-20: qty 1

## 2020-10-20 MED ORDER — METOPROLOL SUCCINATE ER 50 MG PO TB24: ORAL_TABLET | ORAL | 1 refills | Status: DC

## 2020-10-20 MED ORDER — AMLODIPINE BESYLATE 10 MG PO TABS: 5.0000 mg | ORAL_TABLET | Freq: Every day | ORAL | 3 refills | Status: DC

## 2020-10-20 MED ORDER — ISOSORBIDE MONONITRATE ER 30 MG PO TB24: 60.0000 mg | ORAL_TABLET | Freq: Every day | ORAL | Status: DC

## 2020-10-20 NOTE — Progress Notes (Signed)
All set for discharge, discharge instructions given to pt. Instructed to let Rn aware when ready, to call  the cab for him.

## 2020-10-20 NOTE — Plan of Care (Signed)
  Problem: Education: Goal: Knowledge of General Education information will improve Description: Including pain rating scale, medication(s)/side effects and non-pharmacologic comfort measures Outcome: Progressing   Problem: Health Behavior/Discharge Planning: Goal: Ability to manage health-related needs will improve Outcome: Progressing   Problem: Clinical Measurements: Goal: Ability to maintain clinical measurements within normal limits will improve Outcome: Progressing Goal: Will remain free from infection Outcome: Progressing Goal: Diagnostic test results will improve Outcome: Progressing Goal: Respiratory complications will improve Outcome: Progressing Goal: Cardiovascular complication will be avoided Outcome: Progressing   Problem: Activity: Goal: Risk for activity intolerance will decrease Outcome: Progressing   Problem: Nutrition: Goal: Adequate nutrition will be maintained Outcome: Progressing   Problem: Coping: Goal: Level of anxiety will decrease Outcome: Progressing   Problem: Elimination: Goal: Will not experience complications related to bowel motility Outcome: Progressing Goal: Will not experience complications related to urinary retention Outcome: Progressing   Problem: Pain Managment: Goal: General experience of comfort will improve Outcome: Progressing   Problem: Safety: Goal: Ability to remain free from injury will improve Outcome: Progressing   Problem: Skin Integrity: Goal: Risk for impaired skin integrity will decrease Outcome: Progressing   Problem: Education: Goal: Understanding of cardiac disease, CV risk reduction, and recovery process will improve Outcome: Progressing Goal: Individualized Educational Video(s) Outcome: Progressing   Problem: Activity: Goal: Ability to tolerate increased activity will improve Outcome: Progressing   Problem: Cardiac: Goal: Ability to achieve and maintain adequate cardiovascular perfusion will  improve Outcome: Progressing   Problem: Health Behavior/Discharge Planning: Goal: Ability to safely manage health-related needs after discharge will improve Outcome: Progressing   Problem: Education: Goal: Knowledge of disease and its progression will improve Outcome: Progressing Goal: Individualized Educational Video(s) Outcome: Progressing   Problem: Fluid Volume: Goal: Compliance with measures to maintain balanced fluid volume will improve Outcome: Progressing   Problem: Health Behavior/Discharge Planning: Goal: Ability to manage health-related needs will improve Outcome: Progressing   Problem: Nutritional: Goal: Ability to make healthy dietary choices will improve Outcome: Progressing   Problem: Clinical Measurements: Goal: Complications related to the disease process, condition or treatment will be avoided or minimized Outcome: Progressing

## 2020-10-20 NOTE — Progress Notes (Signed)
Patient ID: Kenneth [name], male   DOB: May 04, 1956, 64 y.o.   MRN: [id number]  [facility] Progress Note   Assessment/ Plan:   1.  Chest pain/shortness of breath: With diffuse coronary artery disease not amenable to PCI or CABG and recommendations noted for going medical management per cardiology.  Recurrent chest pain was likely associated with A. fib with RVR; now rate controlled with titration of beta-blockers. 2. ESRD: He undergoes hemodialysis on a TTS schedule as an outpatient and underwent hemodialysis on Thursday-next treatment due on Sunday per holiday schedule (will be ordered if he is still here). 3. Anemia: Hemoglobin and hematocrit currently at goal, monitor on ESA. 4. CKD-MBD: Sensipar and calcitriol for PTH control.  Elevated phosphorus noted-continue Fosrenol/renal diet. 5.  Atrial fibrillation with rapid ventricular response: Difficulty with rate control noted overnight with need for as needed metoprolol.  At his current blood pressures, would favor a change from amlodipine to diltiazem for synergistic negative chronotropic effect. 6. Hypertension:   Subjective:   Without acute events noted overnight, improving rate control noted without additional chest pain or shortness of breath.  Objective:   BP (!) 101/40 (BP Location: Right Arm)   Pulse 81   Temp 98.6 F (37 C) (Oral)   Resp (!) 22   Ht 5\' 10"  (1.778 m)   Wt 82.6 kg   SpO2 97%   BMI 26.13 kg/m   Physical Exam: Gen: Comfortably sleeping in bed, awakens easily to conversation. CVS: Pulse irregular, normal rate, S1 and S2 normal Resp: Clear to auscultation bilaterally, no distinct rales or rhonchi Abd: Soft, flat, nontender, bowel sounds normal Ext: No lower extremity edema, left upper arm AV fistula with intact dressings.  Labs: BMET Recent Labs  Lab 10/16/20 0136 10/17/20 0502 10/18/20 1405 10/19/20 0815 10/20/20 0040  NA 141 138 140 137 136  K 4.4 4.3 4.8 5.4* 4.4  CL 96* 97* 98 97*  95*  CO2 27 26 26 23 25   GLUCOSE 130* 89 88 96 115*  BUN 58* 29* 59* 29* 40*  CREATININE 11.92* 7.67* 12.13* 7.83* 9.86*  CALCIUM 9.2 8.2* 8.2* 8.9 9.1  PHOS  --   --  7.0* 6.0* 7.8*   CBC Recent Labs  Lab 10/16/20 0136 10/17/20 0502 10/18/20 1405 10/19/20 0815  WBC 7.4 11.0* 7.2 9.7  NEUTROABS 4.9  --   --   --   HGB 10.2* 10.1* 9.8* 11.7*  HCT 31.8* 30.9* 28.0* 36.3*  MCV 97.2 95.4 94.0 97.6  PLT 231 262 230 283     Medications:    . aspirin EC  81 mg Oral Daily  . atorvastatin  80 mg Oral Daily  . calcitRIOL  2.25 mcg Oral Q T,Th,Sa-HD  . cinacalcet  30 mg Oral Q T,Th,Sa-HD  . clopidogrel  75 mg Oral Daily  . heparin  5,000 Units Subcutaneous Q12H  . isosorbide mononitrate  60 mg Oral Daily  . metoprolol succinate  100 mg Oral Daily  . metoprolol succinate  50 mg Oral Q2200  . multivitamin  1 tablet Oral QHS  . sodium chloride flush  3 mL Intravenous Q12H   Elmarie Shiley, MD 10/20/2020, 6:52 AM

## 2020-10-20 NOTE — Progress Notes (Signed)
Discharged via cab, belongings taken home.

## 2020-10-20 NOTE — Progress Notes (Signed)
Cardiology Progress Note  Patient ID: Kenneth [name] MRN: [id number] DOB: 10/04/1956 Date of Encounter: 10/20/2020  Primary Cardiologist: Peter [name], MD  Subjective   Chief Complaint: No complaints.  Sleepy.  HPI: Remains in A. fib.  Rates in the 90s.  Denies any chest pain.  ROS:  All other ROS reviewed and negative. Pertinent positives noted in the HPI.     Inpatient Medications  Scheduled Meds: . amLODipine  5 mg Oral Daily  . aspirin EC  81 mg Oral Daily  . atorvastatin  80 mg Oral Daily  . calcitRIOL  2.25 mcg Oral Q T,Th,Sa-HD  . cinacalcet  30 mg Oral Q T,Th,Sa-HD  . clopidogrel  75 mg Oral Daily  . heparin  5,000 Units Subcutaneous Q12H  . isosorbide mononitrate  60 mg Oral Daily  . metoprolol succinate  100 mg Oral Daily  . metoprolol succinate  50 mg Oral Q2200  . multivitamin  1 tablet Oral QHS  . sodium chloride flush  3 mL Intravenous Q12H   Continuous Infusions: . sodium chloride    . sodium chloride    . sodium chloride     PRN Meds: sodium chloride, sodium chloride, sodium chloride, acetaminophen **OR** acetaminophen, heparin, HYDROmorphone (DILAUDID) injection, lidocaine (PF), lidocaine-prilocaine, metoprolol tartrate, ondansetron **OR** ondansetron (ZOFRAN) IV, pentafluoroprop-tetrafluoroeth, sodium chloride flush   Vital Signs   Vitals:   10/19/20 2326 10/20/20 0438 10/20/20 0751 10/20/20 1009  BP: 136/87 (!) 101/40  (!) 121/45  Pulse: 79 81 81 97  Resp: 17 (!) 22    Temp: 97.9 F (36.6 C) 98.6 F (37 C) (!) 97.3 F (36.3 C)   TempSrc: Oral Oral Oral   SpO2: 92% 97% 98%   Weight:  82.6 kg    Height:        Intake/Output Summary (Last 24 hours) at 10/20/2020 1016 Last data filed at 10/19/2020 2200 Gross per 24 hour  Intake 720 ml  Output --  Net 720 ml   Last 3 Weights 10/20/2020 10/19/2020 10/18/2020  Weight (lbs) 182 lb 1.6 oz 179 lb 10.8 oz 181 lb 10.5 oz  Weight (kg) 82.6 kg 81.5 kg 82.4 kg      Telemetry  Overnight  telemetry shows A. fib with rates in the 80s, which I personally reviewed.   ECG  The most recent ECG shows A. fib, heart rate 101, anterior lateral ST depressions, likely repolarization abnormality in the setting of LVH, PVC, which I personally reviewed.   Physical Exam   Vitals:   10/19/20 2326 10/20/20 0438 10/20/20 0751 10/20/20 1009  BP: 136/87 (!) 101/40  (!) 121/45  Pulse: 79 81 81 97  Resp: 17 (!) 22    Temp: 97.9 F (36.6 C) 98.6 F (37 C) (!) 97.3 F (36.3 C)   TempSrc: Oral Oral Oral   SpO2: 92% 97% 98%   Weight:  82.6 kg    Height:         Intake/Output Summary (Last 24 hours) at 10/20/2020 1016 Last data filed at 10/19/2020 2200 Gross per 24 hour  Intake 720 ml  Output --  Net 720 ml    Last 3 Weights 10/20/2020 10/19/2020 10/18/2020  Weight (lbs) 182 lb 1.6 oz 179 lb 10.8 oz 181 lb 10.5 oz  Weight (kg) 82.6 kg 81.5 kg 82.4 kg    Body mass index is 26.13 kg/m.   General: Well nourished, well developed, in no acute distress Head: Atraumatic, normal size  Eyes: PEERLA, EOMI  Neck: Supple,  no JVD Endocrine: No thryomegaly Cardiac: Normal S1, S2; irregular rhythm, no murmurs rubs or gallops Lungs: Clear to auscultation bilaterally, no wheezing, rhonchi or rales  Abd: Soft, nontender, no hepatomegaly  Ext: No edema, pulses 2+ Musculoskeletal: No deformities, BUE and BLE strength normal and equal Skin: Warm and dry, no rashes   Neuro: Alert and oriented to person, place, time, and situation, CNII-XII grossly intact, no focal deficits  Psych: Normal mood and affect   Labs  High Sensitivity Troponin:   Recent Labs  Lab 10/03/20 0554 10/13/20 0140 10/13/20 0420 10/16/20 0818 10/16/20 0858  TROPONINIHS 4,408* 126* 131* 161* 173*     Cardiac EnzymesNo results for input(s): TROPONINI in the last 168 hours. No results for input(s): TROPIPOC in the last 168 hours.  Chemistry Recent Labs  Lab 10/16/20 0136 10/17/20 0502 10/18/20 1405 10/19/20 0815  10/20/20 0040  NA 141   < > 140 137 136  K 4.4   < > 4.8 5.4* 4.4  CL 96*   < > 98 97* 95*  CO2 27   < > 26 23 25   GLUCOSE 130*   < > 88 96 115*  BUN 58*   < > 59* 29* 40*  CREATININE 11.92*   < > 12.13* 7.83* 9.86*  CALCIUM 9.2   < > 8.2* 8.9 9.1  PROT 7.9  --   --   --   --   ALBUMIN 3.2*  --  3.0* 3.3* 3.3*  AST 13*  --   --   --   --   ALT 10  --   --   --   --   ALKPHOS 62  --   --   --   --   BILITOT 0.8  --   --   --   --   GFRNONAA 4*   < > 4* 7* 5*  ANIONGAP 18*   < > 16* 17* 16*   < > = values in this interval not displayed.    Hematology Recent Labs  Lab 10/17/20 0502 10/18/20 1405 10/19/20 0815  WBC 11.0* 7.2 9.7  RBC 3.24* 2.98* 3.72*  HGB 10.1* 9.8* 11.7*  HCT 30.9* 28.0* 36.3*  MCV 95.4 94.0 97.6  MCH 31.2 32.9 31.5  MCHC 32.7 35.0 32.2  RDW 16.9* 17.2* 17.1*  PLT 262 230 283   BNPNo results for input(s): BNP, PROBNP in the last 168 hours.  DDimer No results for input(s): DDIMER in the last 168 hours.   Radiology  No results found.  Cardiac Studies  LHC 05/14/2020  1.  Severe multivessel coronary artery disease with severe calcific distal left main stenosis, severe diffuse ostial/proximal LAD stenosis, severe stenosis of the proximal left circumflex/first OM, and interval occlusion of the distal RCA with left-to-right collaterals 2.  Heavily calcified distal abdominal aorta and bilateral iliac arteries with mild diffuse calcified disease.  No severe iliac lesions identified.  Recommend ongoing medical therapy.  With total occlusion of the RCA and severe calcification of the distal left main and complex disease in the LAD and left circumflex, I think risk of protected PCI would be extremely high.  I also think his access is marginal for large bore support devices from the groin.  TTE 05/13/2020 1. Left ventricular ejection fraction, by estimation, is 55 to 60%. The  left ventricle has normal function. The left ventricle has no regional  wall motion  abnormalities. There is moderate left ventricular hypertrophy.  Left ventricular diastolic  parameters are consistent with Grade I diastolic dysfunction (impaired  relaxation).  2. Right ventricular systolic function is normal. The right ventricular  size is normal. There is normal pulmonary artery systolic pressure.  3. Left atrial size was mildly dilated.  4. The mitral valve is abnormal. Moderate mitral valve regurgitation.  5. The aortic valve is tricuspid. Aortic valve regurgitation is not  visualized. Mild aortic valve sclerosis is present, with no evidence of  aortic valve stenosis.   Patient Profile  Kenneth [name] is a 64 y.o. male with severe three-vessel CAD (not a candidate for CABG or PCI), diastolic heart failure, paroxysmal atrial fibrillation (not on anticoagulation due to noncompliance), ESRD on hemodialysis, hypertension, hyperlipidemia who was admitted on 10/16/2020 with chest pain during hemodialysis.  This was in the setting of missing hemodialysis.  Assessment & Plan   1.  Chest pain/angina in the setting of severe three-vessel CAD/not a candidate for CABG revascularization -He has very severe three-vessel CAD.  Not a candidate for CABG.  Not a candidate for revascularization due to severe blockages. -Chest pain occurred in the setting of missing dialysis.  Chest pain has improved with dialysis.  Suspect this was all related to volume. -Troponins are minimally elevated and flat.  EKG unchanged from prior.  He has no targets for revascularization.  The mainstay is medical therapy.  With good blood pressure and volume control he should do okay from a symptom standpoint. -Continue aspirin, Plavix, Lipitor.  Norvasc 5 mg added today.  On Imdur 60 mg a day.  On Toprol for A. Fib. -Continue high intensity statin.  2.  Paroxysmal atrial fibrillation -In A. fib.  Not a candidate for anticoagulation due to noncompliance.  Therefore, he is not a candidate for rhythm control  strategy. -We will continue with rate control 100 mg metoprolol succinate in the morning and 50 mg of succinate in the evening.  You can further titrate as needed.  [facility] will sign off.   Medication Recommendations: Medical management as above. Other recommendations (labs, testing, etc):  none Follow up as an outpatient: He may keep her regularly scheduled follow-up appointments with [place].  For questions or updates, please contact [place] Please consult [email or web address] for contact info under   Time Spent with Patient: I have spent a total of 25 minutes with patient reviewing hospital notes, telemetry, EKGs, labs and examining the patient as well as establishing an assessment and plan that was discussed with the patient.  > 50% of time was spent in direct patient care.    Signed, Addison Naegeli. Audie Box, [place]  10/20/2020 10:16 AM

## 2020-10-20 NOTE — Discharge Summary (Signed)
Physician Discharge Summary  Kenneth [name] NAT:[id number] DOB: 25-Jun-1956 DOA: 10/16/2020  PCP: [name], MD  Admit date: 10/16/2020 Discharge date: 10/20/2020  Admitted From: Home Disposition: Home  Recommendations for Outpatient Follow-up:  1. Follow up with PCP in 1-2 weeks 2. Please obtain BMP/CBC in one week 3. Patient will be scheduled to follow-up tomorrow at outpatient dialysis center for regular dialysis.  This will be arranged by nephrology 4. -Follow-up with cardiology as previously scheduled  Discharge Condition: Stable CODE STATUS: Full code Diet recommendation: Heart healthy  Brief/Interim Summary: 64 year old male with a history of coronary artery disease, PEA arrest in 2019, end-stage renal disease on hemodialysis, chronic combined CHF, presents to the emergency room with shortness of breath, wheezing and chest pain.  He has known three-vessel coronary artery disease and was not felt to be a candidate for PCI or CABG.  Medication management has been recommended.  Last cardiac catheterization was in 04/2020.  Patient was initially started on nitroglycerin infusion for mildly elevated troponin and chest pain.  Discharge Diagnoses:  Active Problems:   Essential hypertension   ESRD on hemodialysis [facility])   Coronary artery disease involving native heart with angina pectoris (HCC)   Angina at rest [facility])  1.  Chest pain.  Patient has known coronary artery disease not a candidate for CABG or PCI.  He was seen by cardiology and medications were adjusted.  Started on Norvasc and beta-blockers were adjusted.  He is chronically on Imdur.  Continue aspirin and Plavix.  No further inpatient work-up planned at this time.  Plan is for outpatient cardiology follow-up.  2.  End-stage renal disease on hemodialysis.  Seen by nephrology.  He underwent dialysis on 12/22 and 12/23.  Next dialysis session will be as scheduled in his outpatient center on 12/26  3.  Atrial fibrillation  with RVR.  Noted to have tachycardia.  Seen by cardiology and Toprol dose was increased.  Heart rate has since l been stable.  Not felt to be a candidate for anticoagulation due to medication noncompliance  4.  Chronic combined CHF.  Most recent EF 55%.  Volume management with hemodialysis.  Discharge Instructions  Discharge Instructions    Diet - low sodium heart healthy   Complete by: As directed    Increase activity slowly   Complete by: As directed    No wound care   Complete by: As directed      Allergies as of 10/20/2020   No Known Allergies     Medication List    TAKE these medications   amLODipine 10 MG tablet Commonly known as: NORVASC Take 0.5 tablets (5 mg total) by mouth daily. What changed: how much to take   aspirin 81 MG EC tablet Take 1 tablet (81 mg total) by mouth daily. Swallow whole.   atorvastatin 80 MG tablet Commonly known as: LIPITOR Take 1 tablet (80 mg total) by mouth daily.   clopidogrel 75 MG tablet Commonly known as: PLAVIX Take 1 tablet (75 mg total) by mouth daily.   isosorbide mononitrate 30 MG 24 hr tablet Commonly known as: IMDUR Take 2 tablets (60 mg total) by mouth daily.   metoprolol succinate 50 MG 24 hr tablet Commonly known as: TOPROL-XL Take 2 tablets PO qAM and 1 tablet qPM What changed:   medication strength  how much to take  how to take this  when to take this  additional instructions   nitroGLYCERIN 0.4 MG SL tablet Commonly known as: NITROSTAT  Place 1 tablet (0.4 mg total) under the tongue every 5 (five) minutes as needed for chest pain (max 3 doses).   Rena-Vite Rx 1 MG Tabs Take 1 tablet by mouth daily.   Tylenol PM Extra Strength 25-500 MG Tabs tablet Generic drug: diphenhydramine-acetaminophen Take 2 tablets by mouth at bedtime as needed (pain).       No Known Allergies  Consultations:  Nephrology  Cardiology   Procedures/Studies: DG Chest 2 View  Result Date: 10/16/2020 CLINICAL  DATA:  Short of breath for 1 day, history of CHF EXAM: CHEST - 2 VIEW COMPARISON:  10/16/2020 at 2 a.m. FINDINGS: Frontal and lateral views of the chest demonstrate a stable cardiac silhouette allowing for differences in positioning and technique. Since exam performed earlier today, there is increased central vascular congestion with new development of interstitial opacities and bilateral ground-glass airspace disease. No large effusion. No pneumothorax. No acute bony abnormalities. IMPRESSION: 1. Findings most consistent with pulmonary edema, which has developed in the interim since prior exam. Electronically Signed   By: Randa Ngo M.D.   On: 10/16/2020 20:38   DG Chest 2 View  Result Date: 10/16/2020 CLINICAL DATA:  Shortness of breath and wheezing. EXAM: CHEST - 2 VIEW COMPARISON:  October 02, 2020 FINDINGS: Mild diffuse chronic appearing increased interstitial lung markings are seen. There is no evidence of acute infiltrate, pleural effusion or pneumothorax. The heart size and mediastinal contours are within normal limits. There is moderate severity calcification of the aortic arch. The visualized skeletal structures are unremarkable. IMPRESSION: No acute or active cardiopulmonary disease. Electronically Signed   By: Virgina Norfolk M.D.   On: 10/16/2020 02:38   DG Chest 2 View  Result Date: 10/02/2020 CLINICAL DATA:  Chest pain EXAM: CHEST - 2 VIEW COMPARISON:  September 29, 2020 FINDINGS: The heart size and mediastinal contours are within normal limits. Aortic knob calcifications are seen. Both lungs are clear. The visualized skeletal structures are unremarkable. IMPRESSION: No active cardiopulmonary disease. Electronically Signed   By: Prudencio Pair M.D.   On: 10/02/2020 01:17   DG Chest 2 View  Result Date: 09/29/2020 CLINICAL DATA:  Chest pain EXAM: CHEST - 2 VIEW COMPARISON:  09/29/2020 at 12:04 a.m. FINDINGS: The heart size and mediastinal contours are within normal limits. Both lungs are  clear. The visualized skeletal structures are unremarkable. IMPRESSION: No active cardiopulmonary disease. Electronically Signed   By: Randa Ngo M.D.   On: 09/29/2020 21:29   DG Chest 2 View  Result Date: 09/29/2020 CLINICAL DATA:  Chest pain for several hours EXAM: CHEST - 2 VIEW COMPARISON:  09/24/2020 FINDINGS: The heart size and mediastinal contours are within normal limits. Both lungs are clear. The visualized skeletal structures are unremarkable. IMPRESSION: No active cardiopulmonary disease. Electronically Signed   By: Inez Catalina M.D.   On: 09/29/2020 12:19   CT HEAD WO CONTRAST  Result Date: 10/16/2020 CLINICAL DATA:  Mental status change.  Unknown cause. EXAM: CT HEAD WITHOUT CONTRAST TECHNIQUE: Contiguous axial images were obtained from the base of the skull through the vertex without intravenous contrast. COMPARISON:  CT head 03/15/2018 FINDINGS: Brain: Cerebral ventricle sizes are concordant with the degree of cerebral volume loss. Patchy and confluent areas of decreased attenuation are noted throughout the deep and periventricular white matter of the cerebral hemispheres bilaterally, compatible with chronic microvascular ischemic disease. No suspicious lytic or blastic osseous lesions. No acute displaced fracture. Multilevel degenerative changes of the spine. No evidence of large-territorial acute infarction. No  parenchymal hemorrhage. No mass lesion. No extra-axial collection. No mass effect or midline shift. No hydrocephalus. Basilar cisterns are patent. Vascular: No hyperdense vessel. Atherosclerotic calcifications are present within the cavernous internal carotid and bilateral vertebral arteries. Skull: No acute fracture or focal lesion. Sinuses/Orbits: Paranasal sinuses and mastoid air cells are clear. The orbits are unremarkable. Other: None. IMPRESSION: No acute intracranial abnormality. Electronically Signed   By: Iven Finn M.D.   On: 10/16/2020 20:24   DG Chest Portable  1 View  Result Date: 09/24/2020 CLINICAL DATA:  Left-sided chest pain. EXAM: PORTABLE CHEST 1 VIEW COMPARISON:  09/21/2020 and 09/02/2020 FINDINGS: Small densities at left lung base may represent atelectasis. Otherwise, the lungs are clear. Negative for pulmonary edema. Heart and mediastinum are within normal limits. Atherosclerotic calcifications in the thoracic aorta. Negative for pneumothorax. Bone structures are unremarkable. IMPRESSION: No acute cardiopulmonary disease. Electronically Signed   By: Markus Daft M.D.   On: 09/24/2020 08:30   DG Chest Port 1 View  Result Date: 09/21/2020 CLINICAL DATA:  Chest pain short of breath EXAM: PORTABLE CHEST 1 VIEW COMPARISON:  09/02/2020 FINDINGS: Mildly diminished lung volumes. Mild cardiomegaly. Negative for edema, focal opacity or pleural effusion. No pneumothorax. Aortic atherosclerosis IMPRESSION: Mild cardiomegaly. No edema or infiltrate. Electronically Signed   By: Donavan Foil M.D.   On: 09/21/2020 17:11       Subjective: Denies any shortness of breath.  No chest pain.  Discharge Exam: Vitals:   10/20/20 1009 10/20/20 1123 10/20/20 1211 10/20/20 1212  BP: (!) 121/45 (!) 121/45 (!) 121/45   Pulse: 97     Resp:      Temp:   (!) 97.5 F (36.4 C)   TempSrc:   Oral   SpO2:    98%  Weight:      Height:        General: Pt is alert, awake, not in acute distress Cardiovascular: RRR, S1/S2 +, no rubs, no gallops Respiratory: CTA bilaterally, no wheezing, no rhonchi Abdominal: Soft, NT, ND, bowel sounds + Extremities: no edema, no cyanosis    The results of significant diagnostics from this hospitalization (including imaging, microbiology, ancillary and laboratory) are listed below for reference.     Microbiology: Recent Results (from the past 240 hour(s))  Resp Panel by RT-PCR (Flu A&B, Covid) Nasopharyngeal Swab     Status: None   Collection Time: 10/16/20  1:10 PM   Specimen: Nasopharyngeal Swab; Nasopharyngeal(NP) swabs in  vial transport medium  Result Value Ref Range Status   SARS Coronavirus 2 by RT PCR NEGATIVE NEGATIVE Final    Comment: (NOTE) SARS-CoV-2 target nucleic acids are NOT DETECTED.  The SARS-CoV-2 RNA is generally detectable in upper respiratory specimens during the acute phase of infection. The lowest concentration of SARS-CoV-2 viral copies this assay can detect is 138 copies/mL. A negative result does not preclude SARS-Cov-2 infection and should not be used as the sole basis for treatment or other patient management decisions. A negative result may occur with  improper specimen collection/handling, submission of specimen other than nasopharyngeal swab, presence of viral mutation(s) within the areas targeted by this assay, and inadequate number of viral copies(<138 copies/mL). A negative result must be combined with clinical observations, patient history, and epidemiological information. The expected result is Negative.  Fact Sheet for Patients:  EntrepreneurPulse.com.au  Fact Sheet for Healthcare Providers:  IncredibleEmployment.be  This test is no t yet approved or cleared by the [place] FDA and  has been authorized for detection  and/or diagnosis of SARS-CoV-2 by FDA under an Emergency Use Authorization (EUA). This EUA will remain  in effect (meaning this test can be used) for the duration of the COVID-19 declaration under Section 564(b)(1) of the Act, 21 U.S.C.section 360bbb-3(b)(1), unless the authorization is terminated  or revoked sooner.       Influenza A by PCR NEGATIVE NEGATIVE Final   Influenza B by PCR NEGATIVE NEGATIVE Final    Comment: (NOTE) The Xpert Xpress SARS-CoV-2/FLU/RSV plus assay is intended as an aid in the diagnosis of influenza from Nasopharyngeal swab specimens and should not be used as a sole basis for treatment. Nasal washings and aspirates are unacceptable for Xpert Xpress SARS-CoV-2/FLU/RSV testing.  Fact  Sheet for Patients: EntrepreneurPulse.com.au  Fact Sheet for Healthcare Providers: IncredibleEmployment.be  This test is not yet approved or cleared by the [place] FDA and has been authorized for detection and/or diagnosis of SARS-CoV-2 by FDA under an Emergency Use Authorization (EUA). This EUA will remain in effect (meaning this test can be used) for the duration of the COVID-19 declaration under Section 564(b)(1) of the Act, 21 U.S.C. section 360bbb-3(b)(1), unless the authorization is terminated or revoked.  Performed at [facility], [place] [address] [place], [place] [address]      Labs: BNP (last 3 results) No results for input(s): BNP in the last 8760 hours. Basic Metabolic Panel: Recent Labs  Lab 10/16/20 0136 10/17/20 0502 10/18/20 1405 10/19/20 0815 10/20/20 0040  NA 141 138 140 137 136  K 4.4 4.3 4.8 5.4* 4.4  CL 96* 97* 98 97* 95*  CO2 27 26 26 23 25   GLUCOSE 130* 89 88 96 115*  BUN 58* 29* 59* 29* 40*  CREATININE 11.92* 7.67* 12.13* 7.83* 9.86*  CALCIUM 9.2 8.2* 8.2* 8.9 9.1  PHOS  --   --  7.0* 6.0* 7.8*   Liver Function Tests: Recent Labs  Lab 10/16/20 0136 10/18/20 1405 10/19/20 0815 10/20/20 0040  AST 13*  --   --   --   ALT 10  --   --   --   ALKPHOS 62  --   --   --   BILITOT 0.8  --   --   --   PROT 7.9  --   --   --   ALBUMIN 3.2* 3.0* 3.3* 3.3*   No results for input(s): LIPASE, AMYLASE in the last 168 hours. No results for input(s): AMMONIA in the last 168 hours. CBC: Recent Labs  Lab 10/16/20 0136 10/17/20 0502 10/18/20 1405 10/19/20 0815  WBC 7.4 11.0* 7.2 9.7  NEUTROABS 4.9  --   --   --   HGB 10.2* 10.1* 9.8* 11.7*  HCT 31.8* 30.9* 28.0* 36.3*  MCV 97.2 95.4 94.0 97.6  PLT 231 262 230 283   Cardiac Enzymes: No results for input(s): CKTOTAL, CKMB, CKMBINDEX, TROPONINI in the last 168 hours. BNP: Invalid input(s): POCBNP CBG: No results for input(s): GLUCAP in the last 168  hours. D-Dimer No results for input(s): DDIMER in the last 72 hours. Hgb A1c No results for input(s): HGBA1C in the last 72 hours. Lipid Profile No results for input(s): CHOL, HDL, LDLCALC, TRIG, CHOLHDL, LDLDIRECT in the last 72 hours. Thyroid function studies No results for input(s): TSH, T4TOTAL, T3FREE, THYROIDAB in the last 72 hours.  Invalid input(s): FREET3 Anemia work up No results for input(s): VITAMINB12, FOLATE, FERRITIN, TIBC, IRON, RETICCTPCT in the last 72 hours. Urinalysis    Component Value Date/Time  COLORURINE YELLOW 05/12/2016 0834   APPEARANCEUR CLOUDY (A) 05/12/2016 0834   LABSPEC 1.014 05/12/2016 0834   PHURINE 5.0 05/12/2016 0834   GLUCOSEU NEGATIVE 05/12/2016 0834   HGBUR MODERATE (A) 05/12/2016 0834   BILIRUBINUR NEGATIVE 05/12/2016 0834   KETONESUR NEGATIVE 05/12/2016 0834   PROTEINUR 100 (A) 05/12/2016 0834   UROBILINOGEN 0.2 12/09/2013 0013   NITRITE NEGATIVE 05/12/2016 0834   LEUKOCYTESUR NEGATIVE 05/12/2016 0834   Sepsis Labs Invalid input(s): PROCALCITONIN,  WBC,  LACTICIDVEN Microbiology Recent Results (from the past 240 hour(s))  Resp Panel by RT-PCR (Flu A&B, Covid) Nasopharyngeal Swab     Status: None   Collection Time: 10/16/20  1:10 PM   Specimen: Nasopharyngeal Swab; Nasopharyngeal(NP) swabs in vial transport medium  Result Value Ref Range Status   SARS Coronavirus 2 by RT PCR NEGATIVE NEGATIVE Final    Comment: (NOTE) SARS-CoV-2 target nucleic acids are NOT DETECTED.  The SARS-CoV-2 RNA is generally detectable in upper respiratory specimens during the acute phase of infection. The lowest concentration of SARS-CoV-2 viral copies this assay can detect is 138 copies/mL. A negative result does not preclude SARS-Cov-2 infection and should not be used as the sole basis for treatment or other patient management decisions. A negative result may occur with  improper specimen collection/handling, submission of specimen other than  nasopharyngeal swab, presence of viral mutation(s) within the areas targeted by this assay, and inadequate number of viral copies(<138 copies/mL). A negative result must be combined with clinical observations, patient history, and epidemiological information. The expected result is Negative.  Fact Sheet for Patients:  EntrepreneurPulse.com.au  Fact Sheet for Healthcare Providers:  IncredibleEmployment.be  This test is no t yet approved or cleared by the [place] FDA and  has been authorized for detection and/or diagnosis of SARS-CoV-2 by FDA under an Emergency Use Authorization (EUA). This EUA will remain  in effect (meaning this test can be used) for the duration of the COVID-19 declaration under Section 564(b)(1) of the Act, 21 U.S.C.section 360bbb-3(b)(1), unless the authorization is terminated  or revoked sooner.       Influenza A by PCR NEGATIVE NEGATIVE Final   Influenza B by PCR NEGATIVE NEGATIVE Final    Comment: (NOTE) The Xpert Xpress SARS-CoV-2/FLU/RSV plus assay is intended as an aid in the diagnosis of influenza from Nasopharyngeal swab specimens and should not be used as a sole basis for treatment. Nasal washings and aspirates are unacceptable for Xpert Xpress SARS-CoV-2/FLU/RSV testing.  Fact Sheet for Patients: EntrepreneurPulse.com.au  Fact Sheet for Healthcare Providers: IncredibleEmployment.be  This test is not yet approved or cleared by the [place] FDA and has been authorized for detection and/or diagnosis of SARS-CoV-2 by FDA under an Emergency Use Authorization (EUA). This EUA will remain in effect (meaning this test can be used) for the duration of the COVID-19 declaration under Section 564(b)(1) of the Act, 21 U.S.C. section 360bbb-3(b)(1), unless the authorization is terminated or revoked.  Performed at [facility], [place] [address] [place], [place] [address]       Time coordinating discharge: 73mins  SIGNED:   Kathie Dike, MD  [facility] 10/20/2020, 7:13 PM   If 7PM-7AM, please contact night-coverage [email or web address]

## 2020-10-21 ENCOUNTER — Telehealth: Payer: Self-pay | Admitting: Nurse Practitioner

## 2020-10-21 DIAGNOSIS — N2581 Secondary hyperparathyroidism of renal origin: Secondary | ICD-10-CM | POA: Diagnosis not present

## 2020-10-21 DIAGNOSIS — Z992 Dependence on renal dialysis: Secondary | ICD-10-CM | POA: Diagnosis not present

## 2020-10-21 DIAGNOSIS — N186 End stage renal disease: Secondary | ICD-10-CM | POA: Diagnosis not present

## 2020-10-21 DIAGNOSIS — D631 Anemia in chronic kidney disease: Secondary | ICD-10-CM | POA: Diagnosis not present

## 2020-10-21 NOTE — Telephone Encounter (Signed)
Transition of care contact from inpatient facility  Date of discharge: 10/20/2020  Date of contact: 10/21/2020 Method: Phone Spoke to: Daughter Stephan Nelis  Patient contacted to discuss transition of care from recent inpatient hospitalization. Patient was admitted to [facility] from 12/21-12/25/2021 with discharge diagnosis of Chest Pain   Medication changes were reviewed.  Patient will follow up with his/her outpatient HD unit on: 10/21/2020

## 2020-10-22 ENCOUNTER — Telehealth: Payer: Self-pay

## 2020-10-23 ENCOUNTER — Other Ambulatory Visit: Payer: Self-pay

## 2020-10-23 DIAGNOSIS — Z992 Dependence on renal dialysis: Secondary | ICD-10-CM | POA: Diagnosis not present

## 2020-10-23 DIAGNOSIS — D631 Anemia in chronic kidney disease: Secondary | ICD-10-CM | POA: Diagnosis not present

## 2020-10-23 DIAGNOSIS — N186 End stage renal disease: Secondary | ICD-10-CM | POA: Diagnosis not present

## 2020-10-23 DIAGNOSIS — N2581 Secondary hyperparathyroidism of renal origin: Secondary | ICD-10-CM | POA: Diagnosis not present

## 2020-10-23 NOTE — Patient Outreach (Signed)
[facility]) Care Management  10/23/2020  WAYLAN BUSTA 24-Jan-1956 [id number]   Red Emmi: Date of call: 12/272021 Reason for alert:  Scheduled follow up--no                              Unfilled RX---yes                               Able to fill today or tomorrow----no                               Other questions----yes   Placed call to patient. Daughter answered and states patient is at dialysis.  Scheduled appointment to call back 10/24/2020 @9  am  [name], RN, BSN, [facility] [employer] [phone]

## 2020-10-24 ENCOUNTER — Other Ambulatory Visit: Payer: Self-pay

## 2020-10-24 NOTE — Patient Outreach (Signed)
[facility]) Care Management  10/24/2020  Kenneth [name] Mar 23, 1956 [id number]   Telephone assessment and red emmi:  Previous referred patient that I have not been able to reach. Readmitted. Red emmi alert from 10/22/2020. Placed call to patient on 10/23/2020 and patient was at dialysis.  Placed call to patient today. Spoke with patient who gave me permission to speak with daughter Kenneth [name].   Ms Parekh tells me that patient does not have follow up planned with primary MD yet. She reports she will call and make an appointment.  Reviewed red emmi alert of needing medications filled. Daughter reports to me that patient does not have his nitro. Reports she will pick up tomorrow. I reviewed importance of nitro due to patients recent heart attack. Offered to get pharmacy involved and daughter declined need. Patient attend dialysis on T, TH, Sat schedule.  Reports patient uses [facility] for transportation.  Reviewed and inquired about any additional concerns and daughter declines and states they are managing well.    Reviewed case closure with daughter and she was in agreement.  PLAN: close case as no needs.  [name], RN, BSN, CEN [facility] [employer] [phone]

## 2020-10-25 DIAGNOSIS — D631 Anemia in chronic kidney disease: Secondary | ICD-10-CM | POA: Diagnosis not present

## 2020-10-25 DIAGNOSIS — Z992 Dependence on renal dialysis: Secondary | ICD-10-CM | POA: Diagnosis not present

## 2020-10-25 DIAGNOSIS — N186 End stage renal disease: Secondary | ICD-10-CM | POA: Diagnosis not present

## 2020-10-25 DIAGNOSIS — N2581 Secondary hyperparathyroidism of renal origin: Secondary | ICD-10-CM | POA: Diagnosis not present

## 2020-10-26 ENCOUNTER — Encounter (HOSPITAL_COMMUNITY): Payer: Self-pay | Admitting: Emergency Medicine

## 2020-10-26 ENCOUNTER — Inpatient Hospital Stay (HOSPITAL_COMMUNITY)
Admission: EM | Admit: 2020-10-26 | Discharge: 2020-10-29 | DRG: 291 | Disposition: A | Discharge status: Home / Self Care | Payer: Medicare Other | Attending: Internal Medicine | Admitting: Internal Medicine

## 2020-10-26 ENCOUNTER — Emergency Department (HOSPITAL_COMMUNITY): Payer: Medicare Other

## 2020-10-26 DIAGNOSIS — R0789 Other chest pain: Secondary | ICD-10-CM | POA: Diagnosis not present

## 2020-10-26 DIAGNOSIS — R079 Chest pain, unspecified: Secondary | ICD-10-CM | POA: Diagnosis not present

## 2020-10-26 DIAGNOSIS — R0902 Hypoxemia: Secondary | ICD-10-CM | POA: Diagnosis not present

## 2020-10-26 DIAGNOSIS — I252 Old myocardial infarction: Secondary | ICD-10-CM

## 2020-10-26 DIAGNOSIS — Z992 Dependence on renal dialysis: Secondary | ICD-10-CM

## 2020-10-26 DIAGNOSIS — Z7982 Long term (current) use of aspirin: Secondary | ICD-10-CM

## 2020-10-26 DIAGNOSIS — I10 Essential (primary) hypertension: Secondary | ICD-10-CM | POA: Diagnosis present

## 2020-10-26 DIAGNOSIS — Z7902 Long term (current) use of antithrombotics/antiplatelets: Secondary | ICD-10-CM

## 2020-10-26 DIAGNOSIS — E8889 Other specified metabolic disorders: Secondary | ICD-10-CM | POA: Diagnosis present

## 2020-10-26 DIAGNOSIS — I132 Hypertensive heart and chronic kidney disease with heart failure and with stage 5 chronic kidney disease, or end stage renal disease: Secondary | ICD-10-CM | POA: Diagnosis not present

## 2020-10-26 DIAGNOSIS — Z825 Family history of asthma and other chronic lower respiratory diseases: Secondary | ICD-10-CM

## 2020-10-26 DIAGNOSIS — R110 Nausea: Secondary | ICD-10-CM | POA: Diagnosis not present

## 2020-10-26 DIAGNOSIS — I429 Cardiomyopathy, unspecified: Secondary | ICD-10-CM | POA: Diagnosis present

## 2020-10-26 DIAGNOSIS — I480 Paroxysmal atrial fibrillation: Secondary | ICD-10-CM | POA: Diagnosis present

## 2020-10-26 DIAGNOSIS — I2582 Chronic total occlusion of coronary artery: Secondary | ICD-10-CM | POA: Diagnosis present

## 2020-10-26 DIAGNOSIS — Z79899 Other long term (current) drug therapy: Secondary | ICD-10-CM

## 2020-10-26 DIAGNOSIS — I5033 Acute on chronic diastolic (congestive) heart failure: Secondary | ICD-10-CM | POA: Diagnosis present

## 2020-10-26 DIAGNOSIS — I2510 Atherosclerotic heart disease of native coronary artery without angina pectoris: Secondary | ICD-10-CM | POA: Diagnosis present

## 2020-10-26 DIAGNOSIS — Z8674 Personal history of sudden cardiac arrest: Secondary | ICD-10-CM

## 2020-10-26 DIAGNOSIS — F17220 Nicotine dependence, chewing tobacco, uncomplicated: Secondary | ICD-10-CM | POA: Diagnosis present

## 2020-10-26 DIAGNOSIS — K529 Noninfective gastroenteritis and colitis, unspecified: Secondary | ICD-10-CM | POA: Diagnosis present

## 2020-10-26 DIAGNOSIS — E785 Hyperlipidemia, unspecified: Secondary | ICD-10-CM | POA: Diagnosis present

## 2020-10-26 DIAGNOSIS — F1020 Alcohol dependence, uncomplicated: Secondary | ICD-10-CM | POA: Diagnosis present

## 2020-10-26 DIAGNOSIS — F1021 Alcohol dependence, in remission: Secondary | ICD-10-CM | POA: Diagnosis present

## 2020-10-26 DIAGNOSIS — N186 End stage renal disease: Secondary | ICD-10-CM | POA: Diagnosis present

## 2020-10-26 DIAGNOSIS — D631 Anemia in chronic kidney disease: Secondary | ICD-10-CM | POA: Diagnosis present

## 2020-10-26 DIAGNOSIS — I959 Hypotension, unspecified: Secondary | ICD-10-CM | POA: Diagnosis present

## 2020-10-26 DIAGNOSIS — I5043 Acute on chronic combined systolic (congestive) and diastolic (congestive) heart failure: Secondary | ICD-10-CM | POA: Diagnosis present

## 2020-10-26 DIAGNOSIS — N2581 Secondary hyperparathyroidism of renal origin: Secondary | ICD-10-CM | POA: Diagnosis present

## 2020-10-26 DIAGNOSIS — K219 Gastro-esophageal reflux disease without esophagitis: Secondary | ICD-10-CM | POA: Diagnosis present

## 2020-10-26 DIAGNOSIS — Z20822 Contact with and (suspected) exposure to covid-19: Secondary | ICD-10-CM | POA: Diagnosis present

## 2020-10-26 LAB — CBC
HCT: 30.7 % — ABNORMAL LOW (ref 39.0–52.0)
Hemoglobin: 10.3 g/dL — ABNORMAL LOW (ref 13.0–17.0)
MCH: 31.9 pg (ref 26.0–34.0)
MCHC: 33.6 g/dL (ref 30.0–36.0)
MCV: 95 fL (ref 80.0–100.0)
Platelets: 237 10*3/uL (ref 150–400)
RBC: 3.23 MIL/uL — ABNORMAL LOW (ref 4.22–5.81)
RDW: 17.7 % — ABNORMAL HIGH (ref 11.5–15.5)
WBC: 6.7 10*3/uL (ref 4.0–10.5)
nRBC: 0 % (ref 0.0–0.2)

## 2020-10-26 LAB — BASIC METABOLIC PANEL
Anion gap: 18 — ABNORMAL HIGH (ref 5–15)
BUN: 51 mg/dL — ABNORMAL HIGH (ref 8–23)
CO2: 28 mmol/L (ref 22–32)
Calcium: 9.6 mg/dL (ref 8.9–10.3)
Chloride: 91 mmol/L — ABNORMAL LOW (ref 98–111)
Creatinine, Ser: 9.06 mg/dL — ABNORMAL HIGH (ref 0.61–1.24)
GFR, Estimated: 6 mL/min — ABNORMAL LOW (ref >60)
Glucose, Bld: 89 mg/dL (ref 70–99)
Potassium: 4.5 mmol/L (ref 3.5–5.1)
Sodium: 137 mmol/L (ref 135–145)

## 2020-10-26 LAB — TROPONIN I (HIGH SENSITIVITY): Troponin I (High Sensitivity): 34 ng/L — ABNORMAL HIGH (ref <18)

## 2020-10-26 NOTE — ED Triage Notes (Signed)
Pt arrives via EMS from home with CP approx 1 hour PTA. Pt took home 324mg  ASA. Took 2 SL nitro with EMS relief of pain from 9 initally to now pain free. Had 4mg  zofran for 2 episodes of emesis. Pt is T, TH, SAT, dialysis. Has not missed. Pt awake, alert, appropriate. VSS.

## 2020-10-27 ENCOUNTER — Other Ambulatory Visit: Payer: Self-pay

## 2020-10-27 DIAGNOSIS — Z8674 Personal history of sudden cardiac arrest: Secondary | ICD-10-CM | POA: Diagnosis not present

## 2020-10-27 DIAGNOSIS — R072 Precordial pain: Secondary | ICD-10-CM | POA: Diagnosis not present

## 2020-10-27 DIAGNOSIS — I2582 Chronic total occlusion of coronary artery: Secondary | ICD-10-CM | POA: Diagnosis present

## 2020-10-27 DIAGNOSIS — N186 End stage renal disease: Secondary | ICD-10-CM | POA: Diagnosis not present

## 2020-10-27 DIAGNOSIS — E785 Hyperlipidemia, unspecified: Secondary | ICD-10-CM | POA: Diagnosis present

## 2020-10-27 DIAGNOSIS — K529 Noninfective gastroenteritis and colitis, unspecified: Secondary | ICD-10-CM | POA: Diagnosis present

## 2020-10-27 DIAGNOSIS — D631 Anemia in chronic kidney disease: Secondary | ICD-10-CM | POA: Diagnosis present

## 2020-10-27 DIAGNOSIS — I480 Paroxysmal atrial fibrillation: Secondary | ICD-10-CM | POA: Diagnosis not present

## 2020-10-27 DIAGNOSIS — R079 Chest pain, unspecified: Secondary | ICD-10-CM

## 2020-10-27 DIAGNOSIS — Z20822 Contact with and (suspected) exposure to covid-19: Secondary | ICD-10-CM | POA: Diagnosis present

## 2020-10-27 DIAGNOSIS — I132 Hypertensive heart and chronic kidney disease with heart failure and with stage 5 chronic kidney disease, or end stage renal disease: Secondary | ICD-10-CM | POA: Diagnosis not present

## 2020-10-27 DIAGNOSIS — I252 Old myocardial infarction: Secondary | ICD-10-CM | POA: Diagnosis not present

## 2020-10-27 DIAGNOSIS — K219 Gastro-esophageal reflux disease without esophagitis: Secondary | ICD-10-CM | POA: Diagnosis present

## 2020-10-27 DIAGNOSIS — E8889 Other specified metabolic disorders: Secondary | ICD-10-CM | POA: Diagnosis present

## 2020-10-27 DIAGNOSIS — N2581 Secondary hyperparathyroidism of renal origin: Secondary | ICD-10-CM | POA: Diagnosis present

## 2020-10-27 DIAGNOSIS — I5020 Unspecified systolic (congestive) heart failure: Secondary | ICD-10-CM | POA: Diagnosis not present

## 2020-10-27 DIAGNOSIS — I10 Essential (primary) hypertension: Secondary | ICD-10-CM

## 2020-10-27 DIAGNOSIS — Z7902 Long term (current) use of antithrombotics/antiplatelets: Secondary | ICD-10-CM | POA: Diagnosis not present

## 2020-10-27 DIAGNOSIS — Z992 Dependence on renal dialysis: Secondary | ICD-10-CM | POA: Diagnosis not present

## 2020-10-27 DIAGNOSIS — Z79899 Other long term (current) drug therapy: Secondary | ICD-10-CM | POA: Diagnosis not present

## 2020-10-27 DIAGNOSIS — I429 Cardiomyopathy, unspecified: Secondary | ICD-10-CM | POA: Diagnosis present

## 2020-10-27 DIAGNOSIS — I129 Hypertensive chronic kidney disease with stage 1 through stage 4 chronic kidney disease, or unspecified chronic kidney disease: Secondary | ICD-10-CM | POA: Diagnosis not present

## 2020-10-27 DIAGNOSIS — Z7982 Long term (current) use of aspirin: Secondary | ICD-10-CM | POA: Diagnosis not present

## 2020-10-27 DIAGNOSIS — F1021 Alcohol dependence, in remission: Secondary | ICD-10-CM | POA: Diagnosis present

## 2020-10-27 DIAGNOSIS — I5033 Acute on chronic diastolic (congestive) heart failure: Secondary | ICD-10-CM | POA: Diagnosis present

## 2020-10-27 DIAGNOSIS — I2510 Atherosclerotic heart disease of native coronary artery without angina pectoris: Secondary | ICD-10-CM | POA: Diagnosis not present

## 2020-10-27 DIAGNOSIS — I25119 Atherosclerotic heart disease of native coronary artery with unspecified angina pectoris: Secondary | ICD-10-CM | POA: Diagnosis not present

## 2020-10-27 DIAGNOSIS — I959 Hypotension, unspecified: Secondary | ICD-10-CM | POA: Diagnosis present

## 2020-10-27 DIAGNOSIS — N250 Renal osteodystrophy: Secondary | ICD-10-CM | POA: Diagnosis not present

## 2020-10-27 DIAGNOSIS — F17220 Nicotine dependence, chewing tobacco, uncomplicated: Secondary | ICD-10-CM | POA: Diagnosis present

## 2020-10-27 DIAGNOSIS — Z825 Family history of asthma and other chronic lower respiratory diseases: Secondary | ICD-10-CM | POA: Diagnosis not present

## 2020-10-27 LAB — CBC
HCT: 31.8 % — ABNORMAL LOW (ref 39.0–52.0)
Hemoglobin: 11.2 g/dL — ABNORMAL LOW (ref 13.0–17.0)
MCH: 32.7 pg (ref 26.0–34.0)
MCHC: 35.2 g/dL (ref 30.0–36.0)
MCV: 92.7 fL (ref 80.0–100.0)
Platelets: 235 10*3/uL (ref 150–400)
RBC: 3.43 MIL/uL — ABNORMAL LOW (ref 4.22–5.81)
RDW: 17.8 % — ABNORMAL HIGH (ref 11.5–15.5)
WBC: 8.5 10*3/uL (ref 4.0–10.5)
nRBC: 0 % (ref 0.0–0.2)

## 2020-10-27 LAB — HEPATIC FUNCTION PANEL
ALT: 10 U/L (ref 0–44)
AST: 14 U/L — ABNORMAL LOW (ref 15–41)
Albumin: 3.4 g/dL — ABNORMAL LOW (ref 3.5–5.0)
Alkaline Phosphatase: 66 U/L (ref 38–126)
Bilirubin, Direct: <0.1 mg/dL (ref 0.0–0.2)
Total Bilirubin: 0.5 mg/dL (ref 0.3–1.2)
Total Protein: 7.8 g/dL (ref 6.5–8.1)

## 2020-10-27 LAB — RENAL FUNCTION PANEL
Albumin: 3.3 g/dL — ABNORMAL LOW (ref 3.5–5.0)
Anion gap: 12 (ref 5–15)
BUN: 19 mg/dL (ref 8–23)
CO2: 29 mmol/L (ref 22–32)
Calcium: 8.7 mg/dL — ABNORMAL LOW (ref 8.9–10.3)
Chloride: 92 mmol/L — ABNORMAL LOW (ref 98–111)
Creatinine, Ser: 5.11 mg/dL — ABNORMAL HIGH (ref 0.61–1.24)
GFR, Estimated: 12 mL/min — ABNORMAL LOW (ref >60)
Glucose, Bld: 101 mg/dL — ABNORMAL HIGH (ref 70–99)
Phosphorus: 3.4 mg/dL (ref 2.5–4.6)
Potassium: 3.7 mmol/L (ref 3.5–5.1)
Sodium: 133 mmol/L — ABNORMAL LOW (ref 135–145)

## 2020-10-27 LAB — RESP PANEL BY RT-PCR (FLU A&B, COVID) ARPGX2
Influenza A by PCR: NEGATIVE
Influenza B by PCR: NEGATIVE
SARS Coronavirus 2 by RT PCR: NEGATIVE

## 2020-10-27 LAB — TROPONIN I (HIGH SENSITIVITY)
Troponin I (High Sensitivity): 53 ng/L — ABNORMAL HIGH (ref <18)
Troponin I (High Sensitivity): 76 ng/L — ABNORMAL HIGH (ref <18)

## 2020-10-27 LAB — POC OCCULT BLOOD, ED: Fecal Occult Bld: NEGATIVE

## 2020-10-27 LAB — PROTIME-INR
INR: 1 (ref 0.8–1.2)
Prothrombin Time: 13.2 seconds (ref 11.4–15.2)

## 2020-10-27 LAB — LIPID PANEL
Cholesterol: 161 mg/dL (ref 0–200)
HDL: 50 mg/dL (ref >40)
LDL Cholesterol: 94 mg/dL (ref 0–99)
Total CHOL/HDL Ratio: 3.2 RATIO
Triglycerides: 83 mg/dL (ref <150)
VLDL: 17 mg/dL (ref 0–40)

## 2020-10-27 LAB — TYPE AND SCREEN
ABO/RH(D): O POS
Antibody Screen: NEGATIVE

## 2020-10-27 LAB — HEMOGLOBIN AND HEMATOCRIT, BLOOD
HCT: 31.2 % — ABNORMAL LOW (ref 39.0–52.0)
Hemoglobin: 10.7 g/dL — ABNORMAL LOW (ref 13.0–17.0)

## 2020-10-27 LAB — ABO/RH: ABO/RH(D): O POS

## 2020-10-27 LAB — MRSA PCR SCREENING: MRSA by PCR: NEGATIVE

## 2020-10-27 MED ORDER — SODIUM CHLORIDE 0.9 % IV SOLN: 100.0000 mL | INTRAVENOUS | Status: DC | PRN

## 2020-10-27 MED ORDER — ASPIRIN EC 81 MG PO TBEC
81.0000 mg | DELAYED_RELEASE_TABLET | Freq: Every day | ORAL | Status: DC
  Administered 2020-10-27 – 2020-10-29 (×3): 81 mg via ORAL
  Filled 2020-10-27 (×3): qty 1

## 2020-10-27 MED ORDER — PANTOPRAZOLE SODIUM 40 MG PO TBEC
40.0000 mg | DELAYED_RELEASE_TABLET | Freq: Every day | ORAL | Status: DC
  Administered 2020-10-27 – 2020-10-29 (×3): 40 mg via ORAL
  Filled 2020-10-27 (×3): qty 1

## 2020-10-27 MED ORDER — RENA-VITE PO TABS
1.0000 | ORAL_TABLET | Freq: Every day | ORAL | Status: DC
  Administered 2020-10-27 – 2020-10-28 (×2): 1 via ORAL
  Filled 2020-10-27 (×3): qty 1

## 2020-10-27 MED ORDER — LIDOCAINE-PRILOCAINE 2.5-2.5 % EX CREA
1.0000 "application " | TOPICAL_CREAM | CUTANEOUS | Status: DC | PRN
  Filled 2020-10-27: qty 5

## 2020-10-27 MED ORDER — LIDOCAINE HCL (PF) 1 % IJ SOLN: 5.0000 mL | INTRAMUSCULAR | Status: DC | PRN

## 2020-10-27 MED ORDER — RENA-VITE RX 1 MG PO TABS: 1.0000 | ORAL_TABLET | Freq: Every day | ORAL | Status: DC

## 2020-10-27 MED ORDER — NITROGLYCERIN 0.4 MG SL SUBL
0.4000 mg | SUBLINGUAL_TABLET | SUBLINGUAL | Status: DC | PRN
  Administered 2020-10-27 – 2020-10-29 (×4): 0.4 mg via SUBLINGUAL
  Filled 2020-10-27 (×5): qty 1

## 2020-10-27 MED ORDER — AMLODIPINE BESYLATE 5 MG PO TABS
5.0000 mg | ORAL_TABLET | Freq: Every day | ORAL | Status: DC
  Administered 2020-10-27 – 2020-10-29 (×3): 5 mg via ORAL
  Filled 2020-10-27 (×3): qty 1

## 2020-10-27 MED ORDER — ACETAMINOPHEN 325 MG PO TABS
650.0000 mg | ORAL_TABLET | ORAL | Status: DC | PRN
  Administered 2020-10-27 – 2020-10-29 (×2): 650 mg via ORAL
  Filled 2020-10-27 (×2): qty 2

## 2020-10-27 MED ORDER — HEPARIN SODIUM (PORCINE) 1000 UNIT/ML DIALYSIS: 40.0000 [IU]/kg | INTRAMUSCULAR | Status: DC | PRN

## 2020-10-27 MED ORDER — ALTEPLASE 2 MG IJ SOLR
2.0000 mg | Freq: Once | INTRAMUSCULAR | Status: DC | PRN
  Filled 2020-10-27: qty 2

## 2020-10-27 MED ORDER — CHLORHEXIDINE GLUCONATE CLOTH 2 % EX PADS
6.0000 | MEDICATED_PAD | Freq: Every day | CUTANEOUS | Status: DC
  Administered 2020-10-27 – 2020-10-29 (×3): 6 via TOPICAL

## 2020-10-27 MED ORDER — FAMOTIDINE 20 MG PO TABS
20.0000 mg | ORAL_TABLET | Freq: Two times a day (BID) | ORAL | Status: DC | PRN
  Administered 2020-10-28 – 2020-10-29 (×2): 20 mg via ORAL
  Filled 2020-10-27 (×2): qty 1

## 2020-10-27 MED ORDER — PENTAFLUOROPROP-TETRAFLUOROETH EX AERO
1.0000 "application " | INHALATION_SPRAY | CUTANEOUS | Status: DC | PRN
  Filled 2020-10-27: qty 116

## 2020-10-27 MED ORDER — METOPROLOL SUCCINATE ER 100 MG PO TB24
100.0000 mg | ORAL_TABLET | Freq: Two times a day (BID) | ORAL | Status: DC
  Administered 2020-10-27 – 2020-10-29 (×5): 100 mg via ORAL
  Filled 2020-10-27 (×5): qty 1

## 2020-10-27 MED ORDER — PANTOPRAZOLE SODIUM 40 MG IV SOLR
40.0000 mg | Freq: Once | INTRAVENOUS | Status: AC
  Administered 2020-10-27: 40 mg via INTRAVENOUS
  Filled 2020-10-27: qty 40

## 2020-10-27 MED ORDER — HEPARIN SODIUM (PORCINE) 1000 UNIT/ML DIALYSIS: 1000.0000 [IU] | INTRAMUSCULAR | Status: DC | PRN

## 2020-10-27 MED ORDER — ASPIRIN 81 MG PO CHEW: 324.0000 mg | CHEWABLE_TABLET | Freq: Once | ORAL | Status: DC

## 2020-10-27 MED ORDER — ISOSORBIDE MONONITRATE ER 60 MG PO TB24
90.0000 mg | ORAL_TABLET | Freq: Every day | ORAL | Status: DC
  Administered 2020-10-27 – 2020-10-29 (×3): 90 mg via ORAL
  Filled 2020-10-27 (×3): qty 1

## 2020-10-27 MED ORDER — CLOPIDOGREL BISULFATE 75 MG PO TABS
75.0000 mg | ORAL_TABLET | Freq: Every day | ORAL | Status: DC
  Administered 2020-10-27 – 2020-10-29 (×3): 75 mg via ORAL
  Filled 2020-10-27 (×3): qty 1

## 2020-10-27 MED ORDER — ATORVASTATIN CALCIUM 80 MG PO TABS
80.0000 mg | ORAL_TABLET | Freq: Every day | ORAL | Status: DC
  Administered 2020-10-27 – 2020-10-29 (×3): 80 mg via ORAL
  Filled 2020-10-27 (×3): qty 1

## 2020-10-27 NOTE — Procedures (Signed)
   I was present at this dialysis session, have reviewed the session itself and made  appropriate changes Kelly Splinter MD [place] pager [phone]   10/27/2020, 4:54 PM

## 2020-10-27 NOTE — Consult Note (Addendum)
[facility] Renal Consultation Note    Indication for Consultation:  Management of ESRD/hemodialysis, anemia, hypertension/volume, and secondary hyperparathyroidism.  HPI: Kenneth [name] is a 65 y.o. male with PMH including ESRD on dialysis TTS, severe 3 vessel CAD, history of cardiac arrest, a.fib with RVR. ETOH abuse, CHF (EF 30-35%), and recurrent recent admissions for chest pain. Patient was most recently discharged 10/20/20 after admit for CP with known CAD, not a candidate for CABG or PCI. During that admission, he was started on norvasc and beta blockers were adjusted. He presented back at the ED overnight with sharp chest pain and possible hemoptysis. Cardiology was consulted and recommended GERD treatment for atypical chest pain. Also increased toprol to 200mg  daily and increased imdur to 90mg  daily. Labs notable for K+ 4.5, BUN 51, Cr 9.0, Hgb 10.3. Troponins flat. Chest x-ray with no active cardiopulmonary disease.   Patient reports he is feeling better today. Denies SOB, CP, palpitations, dizziness. Denies any further nausea, vomiting or hemoptysis. Last HD was 12/30 and patient reports no issues with his dialysis treatment or fistula.   Past Medical History:  Diagnosis Date  . Acute CHF ([place]) 01/2018  . Cardiomyopathy secondary    likely related to HTN heart disease; possibly ETOH related as well  . Chronic combined systolic and diastolic heart failure (HCC)    Echocardiogram 09/22/11: Moderate LVH, EF 16-07%, grade 3 diastolic dysfunction, mild MR, moderate to severe LAE, mild RVE, mild to moderate TR, small to moderate pericardial effusion  . Coronary artery disease    not felt to be candidate for CABG 08/2018; medical thearpy recomended due to high risk of PCI   . Dysrhythmia    aflutter 04/2016, afib 02/2018, not felt to be a candidate for anticoagulation due to non-compliance and ETOH  . ESRD (end stage renal disease) on dialysis ([place])    due to hypertensive  nephrosclerosis; TTS; [facility] [address] (09/01/2018)  . History of alcohol abuse   . Hypertension   . Myocardial infarction [facility])    " mild " per daughter  . PEA (Pulseless electrical activity) ([place])    PEA arrest 03/09/18, treated empirically for hyperkalemia, shock x1 for WCT, given amiodarone, ROSC after 10 min of ACLS   Past Surgical History:  Procedure Laterality Date  . AV FISTULA PLACEMENT Left 05/14/2016   Procedure: LEFT ARM BASILIC VEIN TRANSPOSITION;  Surgeon: Rosetta Posner, MD;  Location: [place];  Service: Vascular;  Laterality: Left;  . INSERTION OF DIALYSIS CATHETER N/A 06/20/2019   Procedure: INSERTION OF TUNNELED  DIALYSIS CATHETER;  Surgeon: Elam Dutch, MD;  Location: [place];  Service: Vascular;  Laterality: N/A;  . LEFT HEART CATH AND CORONARY ANGIOGRAPHY N/A 09/02/2018   Procedure: LEFT HEART CATH AND CORONARY ANGIOGRAPHY;  Surgeon: Nelva Bush, MD;  Location: [place] CV LAB;  Service: Cardiovascular;  Laterality: N/A;  . LEFT HEART CATH AND CORONARY ANGIOGRAPHY N/A 05/14/2020   Procedure: LEFT HEART CATH AND CORONARY ANGIOGRAPHY;  Surgeon: Sherren Mocha, MD;  Location: [place] CV LAB;  Service: Cardiovascular;  Laterality: N/A;  . PERIPHERAL VASCULAR CATHETERIZATION N/A 05/13/2016   Procedure: Dialysis/Perma Catheter Insertion;  Surgeon: Serafina Mitchell, MD;  Location: [place] CV LAB;  Service: Cardiovascular;  Laterality: N/A;  . REVISION OF ARTERIOVENOUS GORETEX GRAFT Left 3/71/0626   Procedure: PLICATION OF ARTERIOVENOUS FISTULA LEFT ARM;  Surgeon: Elam Dutch, MD;  Location: [facility] OR;  Service: Vascular;  Laterality: Left;   Family History  Problem Relation  Age of Onset  . Emphysema Mother   . Cirrhosis Father    Social History:  reports that he quit smoking about 3 years ago. His smoking use included cigars. He has a 96.00 pack-year smoking history. He has quit using smokeless tobacco.  His smokeless tobacco use included chew. He reports previous  alcohol use. He reports that he does not use drugs.  ROS: As per HPI otherwise negative.   Physical Exam: Vitals:   10/27/20 0745 10/27/20 0815 10/27/20 0845 10/27/20 0851  BP: (!) 148/98 (!) 143/108 (!) 177/105 (!) 176/94  Pulse:  82 71 77  Resp: 18 (!) 22 18 15   Temp:   97.6 F (36.4 C)   TempSrc:   Oral   SpO2:  99% 98% 97%     General: Well developed, well nourished male, in no acute distress. Head: Normocephalic, atraumatic, sclera non-icteric, mucus membranes are moist. Neck: JVD not elevated. Lungs: Clear bilaterally to auscultation without wheezes, rales, or rhonchi. Breathing is unlabored on RA. Heart: RRR with normal S1, S2. No murmurs, rubs, or gallops appreciated. Abdomen: Soft, non-tender, non-distended with normoactive bowel sounds. No rebound/guarding. No obvious abdominal masses. Musculoskeletal:  Strength and tone appear normal for age. Lower extremities: No edema or ischemic changes, no open wounds. Neuro: Alert and oriented X 3. Moves all extremities spontaneously. Psych:  Responds to questions appropriately with a normal affect. Dialysis Access: LUE AVF + bruit  No Known Allergies Prior to Admission medications   Medication Sig Start Date End Date Taking? Authorizing Provider  amLODipine (NORVASC) 10 MG tablet Take 0.5 tablets (5 mg total) by mouth daily. 10/20/20   Kathie Dike, MD  aspirin EC 81 MG EC tablet Take 1 tablet (81 mg total) by mouth daily. Swallow whole. 05/16/20   Alma Friendly, MD  atorvastatin (LIPITOR) 80 MG tablet Take 1 tablet (80 mg total) by mouth daily. 10/09/20   Erlene Quan, PA-C  B Complex-C-Folic Acid (RENA-VITE RX) 1 MG TABS Take 1 tablet by mouth daily. 10/05/20   [provider]  clopidogrel (PLAVIX) 75 MG tablet Take 1 tablet (75 mg total) by mouth daily. 10/09/20   Kilroy, Doreene Burke, PA-C  diphenhydramine-acetaminophen (TYLENOL PM EXTRA STRENGTH) 25-500 MG TABS tablet Take 2 tablets by mouth at bedtime as needed  (pain). 05/12/18   [provider]  isosorbide mononitrate (IMDUR) 30 MG 24 hr tablet Take 2 tablets (60 mg total) by mouth daily. 10/20/20   Kathie Dike, MD  metoprolol succinate (TOPROL-XL) 50 MG 24 hr tablet Take 2 tablets PO qAM and 1 tablet qPM 10/20/20   Kathie Dike, MD  nitroGLYCERIN (NITROSTAT) 0.4 MG SL tablet Place 1 tablet (0.4 mg total) under the tongue every 5 (five) minutes as needed for chest pain (max 3 doses). 10/13/20   Truddie Hidden, MD   Current Facility-Administered Medications  Medication Dose Route Frequency Provider Last Rate Last Admin  . acetaminophen (TYLENOL) tablet 650 mg  650 mg Oral Q4H PRN Shela Leff, MD   650 mg at 10/27/20 0942  . amLODipine (NORVASC) tablet 5 mg  5 mg Oral Daily Barrett, Rhonda G, PA-C      . aspirin EC tablet 81 mg  81 mg Oral Daily Barrett, Rhonda G, PA-C      . atorvastatin (LIPITOR) tablet 80 mg  80 mg Oral Daily Barrett, Rhonda G, PA-C      . clopidogrel (PLAVIX) tablet 75 mg  75 mg Oral Daily Barrett, Rhonda G, PA-C      .  isosorbide mononitrate (IMDUR) 24 hr tablet 90 mg  90 mg Oral Daily Barrett, Rhonda G, PA-C      . metoprolol succinate (TOPROL-XL) 24 hr tablet 100 mg  100 mg Oral BID Barrett, Rhonda G, PA-C      . nitroGLYCERIN (NITROSTAT) SL tablet 0.4 mg  0.4 mg Sublingual Q5 min PRN Shela Leff, MD   0.4 mg at 10/27/20 0855  . pantoprazole (PROTONIX) EC tablet 40 mg  40 mg Oral Q0600 Barrett, Rhonda G, PA-C   40 mg at 10/27/20 0940  . Rena-Vite Rx TABS 1 tablet  1 tablet Oral Daily Lonn Georgia, PA-C       Labs: Basic Metabolic Panel: Recent Labs  Lab 10/26/20 2218  NA 137  K 4.5  CL 91*  CO2 28  GLUCOSE 89  BUN 51*  CREATININE 9.06*  CALCIUM 9.6   Liver Function Tests: Recent Labs  Lab 10/26/20 2251  AST 14*  ALT 10  ALKPHOS 66  BILITOT 0.5  PROT 7.8  ALBUMIN 3.4*   CBC: Recent Labs  Lab 10/26/20 2218 10/27/20 0714  WBC 6.7  --   HGB 10.3* 10.7*  HCT 30.7*  31.2*  MCV 95.0  --   PLT 237  --    Studies/Results: DG Chest 2 View  Result Date: 10/26/2020 CLINICAL DATA:  Chest pain EXAM: CHEST - 2 VIEW COMPARISON:  10/16/2020 FINDINGS: No focal opacity or pleural effusion. Normal cardiomediastinal silhouette. No pneumothorax. Aortic atherosclerosis. IMPRESSION: No active cardiopulmonary disease. Electronically Signed   By: Donavan Foil M.D.   On: 10/26/2020 22:44    Dialysis Orders:  Center: [facility] on TTS. 180NRe, BFR 400, DFR 800, EDW 82.5kg, 2K, 2 Ca, AVF 15g  Heparin 2400 unit bolus Mircera 75 mcg IVP q 2 weeks Calcitirol 2.36mcg PO q HD Sensipar 30 mcg PO q HD  Last HG 12/30, completed full HD but left 1.2kg above EDW  Assessment/Plan: 1.  CAD: Recurrent admissions for the same, cardiology maximizing medical therapy as patient is not a candidate for CABG or PCI. Cardiology recommending trial of GERD treatment. Per cardiology/primary team.  2. A. Fib with RVR: RRR today. Not on anticoagulation.  3.  ESRD:  Usually TTS schedule. Last HD was 12/30. Outpatient units are on a holiday schedule so next HD would be tomorrow, however will plan to maintain regular schedule with HD today as it appears patient will be staying at least overnight. No heparin today due to question of hemoptysis 4.  Hypertension/volume: No edema on CXR. Cardiology titrating BP meds to maximize medical therapy for CAD. Not reaching EDW outpatient, will attempt to get a standing weight post HD today to better assess. 5.  Anemia: Hgb at goal. No indication for ESA at this time. 6.  Metabolic bone disease: Calcium borderline high. Continue VDRA and sensipar, follow closely, may need to decrease calcitriol dose. No phos reported yet. Pt not on binders.  7.  Nutrition:  Renal diet. No alb yet.   Anice Paganini, PA-C 10/27/2020, 10:39 AM  [facility] Pager: [phone]  Pt seen, examined and agree w A/P as above.  Kelly Splinter  MD 10/27/2020, 4:28 PM

## 2020-10-27 NOTE — H&P (Signed)
History and Physical    Kenneth [name]:[id number] DOB: Mar 11, 1956 DOA: 10/26/2020  PCP: [name], MD Patient coming from: Home  Chief Complaint: Chest pain  HPI: Kenneth [name] is a 65 y.o. male with medical history significant of CAD, PEA arrest in 2019, chronic combined systolic and diastolic CHF, ESRD on HD TTS, hypertension presenting with a chief complaint of chest pain.  Patient states yesterday evening around 7 PM while walking he experienced left-sided sharp chest pain.  He then went to the bathroom and threw up some blood.  Does state that he had "red juice" prior to this event.  Denies abdominal pain, hematochezia, or melena.  His chest pain did not resolve with rest and lasted several hours so he called EMS.  States he took "2 pills" at home for his chest pain which did not help.  He does not remember the name of the medications.  Chest pain was not associated with dyspnea or diaphoresis.  Denies any chest pain at present.  States he normally goes for dialysis Tuesday, Thursday, and Saturday but due to the holidays the schedule was different.  His last dialysis session was on Wednesday.  ED Course: Blood pressure elevated with systolic in the 563O to 756E.  WBC 6.7, hemoglobin 10.3 (at baseline), platelet count 237K.  Sodium 137, potassium 4.5, chloride 91, bicarb 28, BUN 51, creatinine 9.0, glucose 89.  High-sensitivity troponin 34 >53.  EKG without acute ischemic changes.  INR 1.0.  FOBT negative.  Screening SARS-CoV-2 PCR testing influenza panel pending.  Chest x-ray showing no active cardiopulmonary disease. Patient was givenIV Protonix 40 mg.  Review of Systems:  All systems reviewed and apart from history of presenting illness, are negative.  Past Medical History:  Diagnosis Date  . Acute CHF ([place]) 01/2018  . Cardiomyopathy secondary    likely related to HTN heart disease; possibly ETOH related as well  . Chronic combined systolic and diastolic heart failure (HCC)     Echocardiogram 09/22/11: Moderate LVH, EF 33-29%, grade 3 diastolic dysfunction, mild MR, moderate to severe LAE, mild RVE, mild to moderate TR, small to moderate pericardial effusion  . Coronary artery disease    not felt to be candidate for CABG 08/2018; medical thearpy recomended due to high risk of PCI   . Dysrhythmia    aflutter 04/2016, afib 02/2018, not felt to be a candidate for anticoagulation due to non-compliance and ETOH  . ESRD (end stage renal disease) on dialysis ([place])    due to hypertensive nephrosclerosis; TTS; [facility] (09/01/2018)  . History of alcohol abuse   . Hypertension   . Myocardial infarction [facility])    " mild " per daughter  . PEA (Pulseless electrical activity) ([place])    PEA arrest 03/09/18, treated empirically for hyperkalemia, shock x1 for WCT, given amiodarone, ROSC after 10 min of ACLS    Past Surgical History:  Procedure Laterality Date  . AV FISTULA PLACEMENT Left 05/14/2016   Procedure: LEFT ARM BASILIC VEIN TRANSPOSITION;  Surgeon: Rosetta Posner, MD;  Location: [place];  Service: Vascular;  Laterality: Left;  . INSERTION OF DIALYSIS CATHETER N/A 06/20/2019   Procedure: INSERTION OF TUNNELED  DIALYSIS CATHETER;  Surgeon: Elam Dutch, MD;  Location: [place];  Service: Vascular;  Laterality: N/A;  . LEFT HEART CATH AND CORONARY ANGIOGRAPHY N/A 09/02/2018   Procedure: LEFT HEART CATH AND CORONARY ANGIOGRAPHY;  Surgeon: Nelva Bush, MD;  Location: [place] [facility] LAB;  Service: Cardiovascular;  Laterality: N/A;  . LEFT HEART CATH AND CORONARY ANGIOGRAPHY N/A 05/14/2020   Procedure: LEFT HEART CATH AND CORONARY ANGIOGRAPHY;  Surgeon: Sherren Mocha, MD;  Location: [place] CV LAB;  Service: Cardiovascular;  Laterality: N/A;  . PERIPHERAL VASCULAR CATHETERIZATION N/A 05/13/2016   Procedure: Dialysis/Perma Catheter Insertion;  Surgeon: Serafina Mitchell, MD;  Location: [place] CV LAB;  Service: Cardiovascular;  Laterality: N/A;  . REVISION OF ARTERIOVENOUS  GORETEX GRAFT Left 0/62/6948   Procedure: PLICATION OF ARTERIOVENOUS FISTULA LEFT ARM;  Surgeon: Elam Dutch, MD;  Location: [place];  Service: Vascular;  Laterality: Left;     reports that he quit smoking about 3 years ago. His smoking use included cigars. He has a 96.00 pack-year smoking history. He has quit using smokeless tobacco.  His smokeless tobacco use included chew. He reports previous alcohol use. He reports that he does not use drugs.  No Known Allergies  Family History  Problem Relation Age of Onset  . Emphysema Mother   . Cirrhosis Father     Prior to Admission medications   Medication Sig Start Date End Date Taking? Authorizing Provider  amLODipine (NORVASC) 10 MG tablet Take 0.5 tablets (5 mg total) by mouth daily. 10/20/20   Kathie Dike, MD  aspirin EC 81 MG EC tablet Take 1 tablet (81 mg total) by mouth daily. Swallow whole. 05/16/20   Alma Friendly, MD  atorvastatin (LIPITOR) 80 MG tablet Take 1 tablet (80 mg total) by mouth daily. 10/09/20   Erlene Quan, PA-C  B Complex-C-Folic Acid (RENA-VITE RX) 1 MG TABS Take 1 tablet by mouth daily. 10/05/20   [provider]  clopidogrel (PLAVIX) 75 MG tablet Take 1 tablet (75 mg total) by mouth daily. 10/09/20   Kilroy, Doreene Burke, PA-C  diphenhydramine-acetaminophen (TYLENOL PM EXTRA STRENGTH) 25-500 MG TABS tablet Take 2 tablets by mouth at bedtime as needed (pain). 05/12/18   [provider]  isosorbide mononitrate (IMDUR) 30 MG 24 hr tablet Take 2 tablets (60 mg total) by mouth daily. 10/20/20   Kathie Dike, MD  metoprolol succinate (TOPROL-XL) 50 MG 24 hr tablet Take 2 tablets PO qAM and 1 tablet qPM 10/20/20   Kathie Dike, MD  nitroGLYCERIN (NITROSTAT) 0.4 MG SL tablet Place 1 tablet (0.4 mg total) under the tongue every 5 (five) minutes as needed for chest pain (max 3 doses). 10/13/20   Truddie Hidden, MD    Physical Exam: Vitals:   10/27/20 0245 10/27/20 0300 10/27/20 0315  10/27/20 0345  BP: (!) 160/85 119/88 (!) 155/84 (!) 154/65  Pulse: 85 75 75 75  Resp: 18 (!) 21 18 (!) 21  Temp:      TempSrc:      SpO2: 90% 94% 93% 99%    Physical Exam Constitutional:      General: He is not in acute distress. HENT:     Head: Normocephalic and atraumatic.  Eyes:     Extraocular Movements: Extraocular movements intact.     Conjunctiva/sclera: Conjunctivae normal.  Cardiovascular:     Rate and Rhythm: Normal rate and regular rhythm.     Pulses: Normal pulses.  Pulmonary:     Effort: Pulmonary effort is normal. No respiratory distress.     Breath sounds: Normal breath sounds. No wheezing or rales.  Abdominal:     General: Bowel sounds are normal. There is no distension.     Palpations: Abdomen is soft.     Tenderness: There is no abdominal tenderness.  Musculoskeletal:        General: No swelling or tenderness.     Cervical back: Normal range of motion and neck supple.  Skin:    General: Skin is warm and dry.  Neurological:     Mental Status: He is alert and oriented to person, place, and time.     Labs on Admission: I have personally reviewed following labs and imaging studies  CBC: Recent Labs  Lab 10/26/20 2218  WBC 6.7  HGB 10.3*  HCT 30.7*  MCV 95.0  PLT 786   Basic Metabolic Panel: Recent Labs  Lab 10/26/20 2218  NA 137  K 4.5  CL 91*  CO2 28  GLUCOSE 89  BUN 51*  CREATININE 9.06*  CALCIUM 9.6   GFR: Estimated Creatinine Clearance: 8.3 mL/min (A) (by C-G formula based on SCr of 9.06 mg/dL (H)). Liver Function Tests: Recent Labs  Lab 10/26/20 2251  AST 14*  ALT 10  ALKPHOS 66  BILITOT 0.5  PROT 7.8  ALBUMIN 3.4*   No results for input(s): LIPASE, AMYLASE in the last 168 hours. No results for input(s): AMMONIA in the last 168 hours. Coagulation Profile: Recent Labs  Lab 10/26/20 2251  INR 1.0   Cardiac Enzymes: No results for input(s): CKTOTAL, CKMB, CKMBINDEX, TROPONINI in the last 168 hours. BNP (last 3  results) No results for input(s): PROBNP in the last 8760 hours. HbA1C: No results for input(s): HGBA1C in the last 72 hours. CBG: No results for input(s): GLUCAP in the last 168 hours. Lipid Profile: No results for input(s): CHOL, HDL, LDLCALC, TRIG, CHOLHDL, LDLDIRECT in the last 72 hours. Thyroid Function Tests: No results for input(s): TSH, T4TOTAL, FREET4, T3FREE, THYROIDAB in the last 72 hours. Anemia Panel: No results for input(s): VITAMINB12, FOLATE, FERRITIN, TIBC, IRON, RETICCTPCT in the last 72 hours. Urine analysis:    Component Value Date/Time   COLORURINE YELLOW 05/12/2016 0834   APPEARANCEUR CLOUDY (A) 05/12/2016 0834   LABSPEC 1.014 05/12/2016 0834   PHURINE 5.0 05/12/2016 0834   GLUCOSEU NEGATIVE 05/12/2016 0834   HGBUR MODERATE (A) 05/12/2016 0834   BILIRUBINUR NEGATIVE 05/12/2016 0834   KETONESUR NEGATIVE 05/12/2016 0834   PROTEINUR 100 (A) 05/12/2016 0834   UROBILINOGEN 0.2 12/09/2013 0013   NITRITE NEGATIVE 05/12/2016 0834   LEUKOCYTESUR NEGATIVE 05/12/2016 0834    Radiological Exams on Admission: DG Chest 2 View  Result Date: 10/26/2020 CLINICAL DATA:  Chest pain EXAM: CHEST - 2 VIEW COMPARISON:  10/16/2020 FINDINGS: No focal opacity or pleural effusion. Normal cardiomediastinal silhouette. No pneumothorax. Aortic atherosclerosis. IMPRESSION: No active cardiopulmonary disease. Electronically Signed   By: Donavan Foil M.D.   On: 10/26/2020 22:44    EKG: Independently reviewed.  Sinus rhythm.  T wave inversions in inferior and lateral leads similar to prior tracing.  Assessment/Plan Principal Problem:   Chest pain Active Problems:   Essential hypertension   ESRD (end stage renal disease) (HCC)   CAD (coronary artery disease)   PAF (paroxysmal atrial fibrillation) (HCC)   Chest pain History of CAD High-sensitivity troponin mildly elevated 34 >53.  EKG without acute ischemic changes.  Chest pain has now resolved.  PE less likely given no tachycardia  or hypoxia.  Patient was recently admitted to the hospital 12/21-12/25 for chest pain.  He has a known history of severe multivessel CAD and was felt not to be a candidate for PCI or CABG.  Medical management was recommended by cardiology.  Last cardiac catheterization was in July 2021. -Cardiac  monitoring, trend troponin.  Resume home beta-blocker, statin, and Imdur after pharmacy med rec is done.  Resume home aspirin if hemoglobin is stable on repeat labs given question of hematemesis (see below).  Sublingual nitroglycerin as needed.  I have sent an inbox message to [facility] Filler requesting cardiology consultation in the morning.  ?Hematemesis: Patient reports an episode of vomiting blood after drinking red-colored juice at home.  No complaints of abdominal pain, hematochezia, or melena.  Does have prior history of alcohol abuse but quit in 2019. No episodes of hematemesis since he has been in the ED.  Hemoglobin stable.  FOBT negative.  Abdominal exam benign. -He was given IV Protonix 40 mg.  Repeat H&H again.  ESRD on HD TTS: Patient states his last dialysis session was 3 days ago.  Blood pressure slightly elevated but does not appear volume overloaded on exam.  Potassium and bicarb levels normal.  No indication for urgent dialysis overnight. -Consult nephrology in the morning for dialysis.  Chronic combined systolic and diastolic CHF: Does not appear volume overloaded on exam. -Volume management with hemodialysis.  Hypertension: Systolic currently in the 150s. -Resume home amlodipine, Imdur, and metoprolol after pharmacy med rec is done.  Paroxysmal A. fib: Currently in sinus rhythm.  Followed by cardiology and felt not to be a candidate for anticoagulation due to history of medication noncompliance. -Resume metoprolol after pharmacy med rec is done.  DVT prophylaxis: SCDs at this time, repeat H&H pending Code Status: Patient wishes to be full code. Family Communication: No family at  bedside. Disposition Plan: Status is: Observation  The patient remains OBS appropriate and will d/c before 2 midnights.  Dispo: The patient is from: Home              Anticipated d/c is to: Home              Anticipated d/c date is: 2 days              Patient currently is not medically stable to d/c.  The medical decision making on this patient was of high complexity and the patient is at high risk for clinical deterioration, therefore this is a level 3 visit.  Shela Leff MD [facility]  If 7PM-7AM, please contact night-coverage [email or web address]  10/27/2020, 6:48 AM

## 2020-10-27 NOTE — ED Provider Notes (Signed)
[facility] EMERGENCY DEPARTMENT Provider Note   CSN: [id number] Arrival date & time: 10/26/20  2206     History Chief Complaint  Patient presents with  . Chest Pain    Kenneth [name] is a 65 y.o. male.  HPI   Pt is a 65 y/o male with a h/o CHF, CAD, ESRD on dialysis, MI, PEA, who presents to the ED today for eval of chest pain. Pain has been ongoing for the last 4-5 months but this particular episode started earlier today and is located to the left side of the chest. Rates pain 9/10. Describes pain as burning and aching. States pain intermittently radiates to the LUE. States that when he exerts himself or does an activity the pain resolves. Pain is intermittent. Reports some associated shortness of breath which he states is baseline. Denies any pleuritic pain. He has an intermittent cough. Denies hemoptysis.   Denies leg pain/swelling, hemoptysis, recent surgery/trauma, recent long travel, hormone use, personal hx of cancer, or hx of DVT/PE. On dialysis for one year.  States he had an episode of vomiting today. States his emesis looked bloody and describes it as bright red. Denies abd pain. Denies hematochezia or melena. Denies fevers. Has chronic diarrhea which is unchanged. Denies blood thinner use. Denies alcohol use. Denies NSAID use. He later spoke with attending physician and stated he had a red drink at dinner and thinks that it what caused his red emesis.  Past Medical History:  Diagnosis Date  . Acute CHF ([place]) 01/2018  . Cardiomyopathy secondary    likely related to HTN heart disease; possibly ETOH related as well  . Chronic combined systolic and diastolic heart failure (HCC)    Echocardiogram 09/22/11: Moderate LVH, EF 62-94%, grade 3 diastolic dysfunction, mild MR, moderate to severe LAE, mild RVE, mild to moderate TR, small to moderate pericardial effusion  . Coronary artery disease    not felt to be candidate for CABG 08/2018; medical thearpy  recomended due to high risk of PCI   . Dysrhythmia    aflutter 04/2016, afib 02/2018, not felt to be a candidate for anticoagulation due to non-compliance and ETOH  . ESRD (end stage renal disease) on dialysis ([place])    due to hypertensive nephrosclerosis; TTS; [address] (09/01/2018)  . History of alcohol abuse   . Hypertension   . Myocardial infarction [facility])    " mild " per daughter  . PEA (Pulseless electrical activity) ([place])    PEA arrest 03/09/18, treated empirically for hyperkalemia, shock x1 for WCT, given amiodarone, ROSC after 10 min of ACLS    Patient Active Problem List   Diagnosis Date Noted  . Chest pain 10/27/2020  . Troponin level elevated   . Angina at rest [facility]) 09/24/2020  . Accelerated hypertension 09/24/2020  . Dyslipidemia 09/24/2020  . NSTEMI (non-ST elevated myocardial infarction) ([place]) 05/12/2020  . Non-ST elevated myocardial infarction ([place]) 05/12/2020  . DNR (do not resuscitate) discussion   . Palliative care by specialist   . Goals of care, counseling/discussion   . Coronary artery disease involving native heart with angina pectoris ([place])   . STEMI (ST elevation myocardial infarction) ([place]) 09/17/2018  . Unstable angina ([place])   . ACS (acute coronary syndrome) ([place]) 09/01/2018  . ESRD (end stage renal disease) ([place]) 07/20/2018  . Acute respiratory failure ([place]) 05/11/2018  . Cardiac arrest ([place]) 03/09/2018  . Acute encephalopathy   . Acute on chronic respiratory failure ([place]) 02/16/2018  . Tobacco dependence  02/16/2018  . Pulmonary edema 01/26/2018  . Acute respiratory failure with hypoxia ([place]) 01/26/2018  . Atrial fibrillation and flutter ([place])   . Shortness of breath   . Acute on chronic diastolic CHF (congestive heart failure) ([place])   . Hypothermia 10/08/2016  . ESRD on hemodialysis ([place]) 10/08/2016  . Fall 10/08/2016  . Syncope 10/07/2016  . Near syncope 07/07/2016  . Cardiomyopathy ([place]) 07/07/2016  . Ventricular tachycardia, non-sustained ([place])  07/06/2016  . Hypoglycemia 07/06/2016  . CKD (chronic kidney disease) stage 5, GFR less than 15 ml/min (HCC)   . Alcoholism ([place]) 05/13/2016  . Hypertensive emergency 12/08/2013  . Cardiomyopathy secondary 10/09/2011  . Essential hypertension 10/09/2011  . Alcohol withdrawal delirium ([place]) 09/25/2011  . Chronic diastolic CHF (congestive heart failure) ([place]) 09/24/2011    Past Surgical History:  Procedure Laterality Date  . AV FISTULA PLACEMENT Left 05/14/2016   Procedure: LEFT ARM BASILIC VEIN TRANSPOSITION;  Surgeon: Rosetta Posner, MD;  Location: [place];  Service: Vascular;  Laterality: Left;  . INSERTION OF DIALYSIS CATHETER N/A 06/20/2019   Procedure: INSERTION OF TUNNELED  DIALYSIS CATHETER;  Surgeon: Elam Dutch, MD;  Location: [place];  Service: Vascular;  Laterality: N/A;  . LEFT HEART CATH AND CORONARY ANGIOGRAPHY N/A 09/02/2018   Procedure: LEFT HEART CATH AND CORONARY ANGIOGRAPHY;  Surgeon: Nelva Bush, MD;  Location: [place] CV LAB;  Service: Cardiovascular;  Laterality: N/A;  . LEFT HEART CATH AND CORONARY ANGIOGRAPHY N/A 05/14/2020   Procedure: LEFT HEART CATH AND CORONARY ANGIOGRAPHY;  Surgeon: Sherren Mocha, MD;  Location: [place] CV LAB;  Service: Cardiovascular;  Laterality: N/A;  . PERIPHERAL VASCULAR CATHETERIZATION N/A 05/13/2016   Procedure: Dialysis/Perma Catheter Insertion;  Surgeon: Serafina Mitchell, MD;  Location: [place] CV LAB;  Service: Cardiovascular;  Laterality: N/A;  . REVISION OF ARTERIOVENOUS GORETEX GRAFT Left 2/40/9735   Procedure: PLICATION OF ARTERIOVENOUS FISTULA LEFT ARM;  Surgeon: Elam Dutch, MD;  Location: [facility] OR;  Service: Vascular;  Laterality: Left;       Family History  Problem Relation Age of Onset  . Emphysema Mother   . Cirrhosis Father     Social History   Tobacco Use  . Smoking status: Former Smoker    Packs/day: 2.00    Years: 48.00    Pack years: 96.00    Types: Cigars    Quit date: 10/27/2017    Years  since quitting: 3.0  . Smokeless tobacco: Former Systems developer    Types: Secondary school teacher  . Vaping Use: Never used  Substance Use Topics  . Alcohol use: Not Currently    Comment: h/o very heavy use, quit about 2019  . Drug use: No    Home Medications Prior to Admission medications   Medication Sig Start Date End Date Taking? Authorizing Provider  amLODipine (NORVASC) 10 MG tablet Take 0.5 tablets (5 mg total) by mouth daily. 10/20/20   Kathie Dike, MD  aspirin EC 81 MG EC tablet Take 1 tablet (81 mg total) by mouth daily. Swallow whole. 05/16/20   Alma Friendly, MD  atorvastatin (LIPITOR) 80 MG tablet Take 1 tablet (80 mg total) by mouth daily. 10/09/20   Erlene Quan, PA-C  B Complex-C-Folic Acid (RENA-VITE RX) 1 MG TABS Take 1 tablet by mouth daily. 10/05/20   [provider]  clopidogrel (PLAVIX) 75 MG tablet Take 1 tablet (75 mg total) by mouth daily. 10/09/20   Kilroy, Doreene Burke, PA-C  diphenhydramine-acetaminophen (TYLENOL PM  EXTRA STRENGTH) 25-500 MG TABS tablet Take 2 tablets by mouth at bedtime as needed (pain). 05/12/18   [provider]  isosorbide mononitrate (IMDUR) 30 MG 24 hr tablet Take 2 tablets (60 mg total) by mouth daily. 10/20/20   Kathie Dike, MD  metoprolol succinate (TOPROL-XL) 50 MG 24 hr tablet Take 2 tablets PO qAM and 1 tablet qPM 10/20/20   Kathie Dike, MD  nitroGLYCERIN (NITROSTAT) 0.4 MG SL tablet Place 1 tablet (0.4 mg total) under the tongue every 5 (five) minutes as needed for chest pain (max 3 doses). 10/13/20   Truddie Hidden, MD    Allergies    Patient has no known allergies.  Review of Systems   Review of Systems  Constitutional: Negative for fever.  HENT: Negative for ear pain and sore throat.   Eyes: Negative for visual disturbance.  Respiratory: Positive for shortness of breath. Negative for cough.   Cardiovascular: Positive for chest pain. Negative for leg swelling.  Gastrointestinal: Positive for nausea and  vomiting. Negative for abdominal pain, constipation and diarrhea.  Genitourinary: Negative for dysuria and hematuria.  Musculoskeletal: Negative for back pain.  Skin: Negative for rash.  Neurological: Negative for headaches.  All other systems reviewed and are negative.   Physical Exam Updated Vital Signs BP (!) 154/65   Pulse 75   Temp 98.2 F (36.8 C) (Oral)   Resp (!) 21   SpO2 99%   Physical Exam Vitals and nursing note reviewed.  Constitutional:      Appearance: He is well-developed and well-nourished.  HENT:     Head: Normocephalic and atraumatic.  Eyes:     Conjunctiva/sclera: Conjunctivae normal.  Cardiovascular:     Rate and Rhythm: Normal rate and regular rhythm.     Heart sounds: Normal heart sounds. No murmur heard.   Pulmonary:     Effort: Pulmonary effort is normal. No respiratory distress.     Breath sounds: Normal breath sounds. No decreased breath sounds, wheezing, rhonchi or rales.  Chest:     Chest wall: No tenderness.  Abdominal:     Palpations: Abdomen is soft.     Tenderness: There is no abdominal tenderness.  Musculoskeletal:        General: No edema.     Cervical back: Neck supple.  Skin:    General: Skin is warm and dry.  Neurological:     Mental Status: He is alert.  Psychiatric:        Mood and Affect: Mood and affect normal.     ED Results / Procedures / Treatments   Labs (all labs ordered are listed, but only abnormal results are displayed) Labs Reviewed  BASIC METABOLIC PANEL - Abnormal; Notable for the following components:      Result Value   Chloride 91 (*)    BUN 51 (*)    Creatinine, Ser 9.06 (*)    GFR, Estimated 6 (*)    Anion gap 18 (*)    All other components within normal limits  CBC - Abnormal; Notable for the following components:   RBC 3.23 (*)    Hemoglobin 10.3 (*)    HCT 30.7 (*)    RDW 17.7 (*)    All other components within normal limits  HEPATIC FUNCTION PANEL - Abnormal; Notable for the following  components:   Albumin 3.4 (*)    AST 14 (*)    All other components within normal limits  TROPONIN I (HIGH SENSITIVITY) - Abnormal; Notable for the  following components:   Troponin I (High Sensitivity) 34 (*)    All other components within normal limits  TROPONIN I (HIGH SENSITIVITY) - Abnormal; Notable for the following components:   Troponin I (High Sensitivity) 53 (*)    All other components within normal limits  RESP PANEL BY RT-PCR (FLU A&B, COVID) ARPGX2  PROTIME-INR  POC OCCULT BLOOD, ED  TYPE AND SCREEN  ABO/RH    EKG EKG Interpretation  Date/Time:  Friday October 26 2020 22:18:25 EST Ventricular Rate:  82 PR Interval:  192 QRS Duration: 96 QT Interval:  380 QTC Calculation: 443 R Axis:   33 Text Interpretation: Normal sinus rhythm with sinus arrhythmia Left ventricular hypertrophy with repolarization abnormality ( Cornell product ) Cannot rule out Septal infarct , age undetermined Abnormal ECG No longer in a fib but otherwise no change compared to prior EKG Confirmed by Pryor Curia [phone]) on 10/27/2020 12:06:06 AM   Radiology DG Chest 2 View  Result Date: 10/26/2020 CLINICAL DATA:  Chest pain EXAM: CHEST - 2 VIEW COMPARISON:  10/16/2020 FINDINGS: No focal opacity or pleural effusion. Normal cardiomediastinal silhouette. No pneumothorax. Aortic atherosclerosis. IMPRESSION: No active cardiopulmonary disease. Electronically Signed   By: Donavan Foil M.D.   On: 10/26/2020 22:44    Procedures Procedures (including critical care time)  Medications Ordered in ED Medications  aspirin chewable tablet 324 mg (324 mg Oral Not Given 10/27/20 0303)  pantoprazole (PROTONIX) injection 40 mg (40 mg Intravenous Given 10/27/20 0230)    ED Course  I have reviewed the triage vital signs and the nursing notes.  Pertinent labs & imaging results that were available during my care of the patient were reviewed by me and considered in my medical decision making (see chart for  details).    MDM Rules/Calculators/A&P                          65 year old male presenting the emergency department today for evaluation of chest pain.  Has significant risk factors.  Reviewed/interpreted labs CBC without leukocytosis, anemia present but baseline BMP with elevated BUN/creatinine consistent with history of ESRD. Initial troponin is elevated in the 30s, repeat troponin increasing in the 50s LFTs within normal limits INR normal Point-of-care Hemoccult is negative therefore low suspicion for complicated GI bleed.  EKG Normal sinus rhythm with sinus arrhythmia Left ventricular hypertrophy with repolarization abnormality ( Cornell product ) Cannot rule out Septal infarct , age undetermined Abnormal ECG No longer in a fib but otherwise no change compared to prior EKG  CXR No active cardiopulmonary disease  4:00 AM CONSULT with Dr. Ezekiel Ina who accepts patient for admission   Final Clinical Impression(s) / ED Diagnoses Final diagnoses:  Chest pain, unspecified type    Rx / DC Orders ED Discharge Orders    None       Rodney Booze, PA-C 10/27/20 0502    Ward, Delice Bison, DO 10/27/20 0518

## 2020-10-27 NOTE — ED Notes (Signed)
Attempted to call report. RN unable to take report.  

## 2020-10-27 NOTE — ED Notes (Addendum)
Assumed care of patient. Patient ambulatory from triage with c/o cp and sob x1 hour pta. Patient is now pain free. Patient receives dialysis T, Th, and Sat and has been compliant. Reports he is do tomorrow for dialysis. Alert and oriented. Repeat troponin drawn and sent to lab. NAD. Cardiac, bp, and pulse ox monitoring in place. Call bell and phone in reach. Will continue to monitor. PA at bedside for assessment.

## 2020-10-27 NOTE — Progress Notes (Signed)
Patient just finished breakfast then c/o chest pain. He was restless. At this time, administered protonix rather than NTG. Patient was standing up and tried to make himself comfortable. HS [employer]

## 2020-10-27 NOTE — Consult Note (Addendum)
Cardiology Consultation:   Patient ID: Kenneth [name]; [id number]; 12-Jan-1956   Admit date: 10/26/2020 Date of Consult: 10/27/2020  Primary Care Provider: [name], MD Primary Cardiologist: Peter [name], MD 02/22/2020 Primary Electrophysiologist:  None   Patient Profile:   Kenneth [name] is a 65 y.o. male with a hx of CAD rx medically, PEA arrest 2019, ESRD on HD, S-D-CHF, HTN, HLD, Afib, hx ETOH abuse, who is being seen today for the evaluation of chest pain at the request of Dr Marlowe Sax.  History of Present Illness:   [name] was admitted 12/21-12/25/2021 with SOB and chest pain, trop peak 173, amlodipine added, BB increased, not able to anticoagulate due to non-compliance.  [name] was admitted 12/31 with chest pain, initially relieved by SL ntg x 2 by EMS. SBP elevated 170s, ?hematemesis. Cards asked to see.  [name] has had multiple episodes of chest pain recently.  It can come anytime.  It reaches a 9/10 at times.  He has not taken nitroglycerin for it, but EMS nitroglycerin gave him some relief.  He will get some shortness of breath with the pain.  The episodes are always the same, substernal and pressure/squeezing.  He denies diaphoresis with the pain.  It is not clear if the emesis he had prior to admission was from the chest pain or not.  He has not missed any dialysis sessions, is due for dialysis today.  His daughter is helping him to manage his medications to help him be more compliant with them.  He was in A. fib when he left the hospital last time, but is in sinus rhythm today.  He does not know when the change happened, did not feel any different.   Past Medical History:  Diagnosis Date   Acute CHF ([place]) 01/2018   Cardiomyopathy secondary    likely related to HTN heart disease; possibly ETOH related as well   Chronic combined systolic and diastolic heart failure ([place])    Echocardiogram 09/22/11: Moderate LVH, EF 02-77%, grade 3 diastolic  dysfunction, mild MR, moderate to severe LAE, mild RVE, mild to moderate TR, small to moderate pericardial effusion   Coronary artery disease    not felt to be candidate for CABG 08/2018; medical thearpy recomended due to high risk of PCI    Dysrhythmia    aflutter 04/2016, afib 02/2018, not felt to be a candidate for anticoagulation due to non-compliance and ETOH   ESRD (end stage renal disease) on dialysis ([place])    due to hypertensive nephrosclerosis; TTS; Richarda Blade. (09/01/2018)   History of alcohol abuse    Hypertension    Myocardial infarction [facility])    " mild " per daughter   PEA (Pulseless electrical activity) ([place])    PEA arrest 03/09/18, treated empirically for hyperkalemia, shock x1 for WCT, given amiodarone, ROSC after 10 min of ACLS    Past Surgical History:  Procedure Laterality Date   AV FISTULA PLACEMENT Left 05/14/2016   Procedure: LEFT ARM BASILIC VEIN TRANSPOSITION;  Surgeon: Rosetta Posner, MD;  Location: [place];  Service: Vascular;  Laterality: Left;   INSERTION OF DIALYSIS CATHETER N/A 06/20/2019   Procedure: INSERTION OF TUNNELED  DIALYSIS CATHETER;  Surgeon: Elam Dutch, MD;  Location: [place];  Service: Vascular;  Laterality: N/A;   LEFT HEART CATH AND CORONARY ANGIOGRAPHY N/A 09/02/2018   Procedure: LEFT HEART CATH AND CORONARY ANGIOGRAPHY;  Surgeon: Nelva Bush, MD;  Location: [place] CV LAB;  Service: Cardiovascular;  Laterality: N/A;   LEFT HEART CATH AND CORONARY ANGIOGRAPHY N/A 05/14/2020   Procedure: LEFT HEART CATH AND CORONARY ANGIOGRAPHY;  Surgeon: Sherren Mocha, MD;  Location: [place] [facility];  Service: Cardiovascular;  Laterality: N/A;   PERIPHERAL VASCULAR CATHETERIZATION N/A 05/13/2016   Procedure: Dialysis/Perma Catheter Insertion;  Surgeon: Serafina Mitchell, MD;  Location: [place] CV LAB;  Service: Cardiovascular;  Laterality: N/A;   REVISION OF ARTERIOVENOUS GORETEX GRAFT Left 7/98/9211   Procedure: PLICATION OF ARTERIOVENOUS  FISTULA LEFT ARM;  Surgeon: Elam Dutch, MD;  Location: [facility] OR;  Service: Vascular;  Laterality: Left;     Prior to Admission medications   Medication Sig Start Date End Date Taking? Authorizing Provider  amLODipine (NORVASC) 10 MG tablet Take 0.5 tablets (5 mg total) by mouth daily. 10/20/20   Kathie Dike, MD  aspirin EC 81 MG EC tablet Take 1 tablet (81 mg total) by mouth daily. Swallow whole. 05/16/20   Alma Friendly, MD  atorvastatin (LIPITOR) 80 MG tablet Take 1 tablet (80 mg total) by mouth daily. 10/09/20   Erlene Quan, PA-C  B Complex-C-Folic Acid (RENA-VITE RX) 1 MG TABS Take 1 tablet by mouth daily. 10/05/20   [provider]  clopidogrel (PLAVIX) 75 MG tablet Take 1 tablet (75 mg total) by mouth daily. 10/09/20   Kilroy, Doreene Burke, PA-C  diphenhydramine-acetaminophen (TYLENOL PM EXTRA STRENGTH) 25-500 MG TABS tablet Take 2 tablets by mouth at bedtime as needed (pain). 05/12/18   [provider]  isosorbide mononitrate (IMDUR) 30 MG 24 hr tablet Take 2 tablets (60 mg total) by mouth daily. 10/20/20   Kathie Dike, MD  metoprolol succinate (TOPROL-XL) 50 MG 24 hr tablet Take 2 tablets PO qAM and 1 tablet qPM 10/20/20   Kathie Dike, MD  nitroGLYCERIN (NITROSTAT) 0.4 MG SL tablet Place 1 tablet (0.4 mg total) under the tongue every 5 (five) minutes as needed for chest pain (max 3 doses). 10/13/20   Truddie Hidden, MD    Inpatient Medications: Scheduled Meds:  Continuous Infusions:  PRN Meds: acetaminophen, nitroGLYCERIN  Allergies:   No Known Allergies  Social History:   Social History   Socioeconomic History   Marital status: Divorced    Spouse name: Not on file   Number of children: Not on file   Years of education: Not on file   Highest education level: Not on file  Occupational History   Occupation: retired  Tobacco Use   Smoking status: Former Smoker    Packs/day: 2.00    Years: 48.00    Pack years: 96.00    Types:  Cigars    Quit date: 10/27/2017    Years since quitting: 3.0   Smokeless tobacco: Former Systems developer    Types: Nurse, children's Use: Never used  Substance and Sexual Activity   Alcohol use: Not Currently    Comment: h/o very heavy use, quit about 2019   Drug use: No   Sexual activity: Not Currently  Other Topics Concern   Not on file  Social History Narrative   Not on file   Social Determinants of Health   Financial Resource Strain: Not on file  Food Insecurity: Not on file  Transportation Needs: Not on file  Physical Activity: Not on file  Stress: Not on file  Social Connections: Not on file  Intimate Partner Violence: Not on file    Family History:   Family History  Problem Relation Age of Onset  Emphysema Mother    Cirrhosis Father    Family Status:  Family Status  Relation Name Status   Mother  Deceased   Father  Deceased   MGM  Deceased   MGF  Deceased   PGM  Deceased   PGF  Deceased    ROS:  Please see the history of present illness.  All other ROS reviewed and negative.     Physical Exam/Data:   Vitals:   10/27/20 0300 10/27/20 0315 10/27/20 0345 10/27/20 0720  BP: 119/88 (!) 155/84 (!) 154/65 (!) 164/94  Pulse: 75 75 75 (!) 58  Resp: (!) 21 18 (!) 21 20  Temp:    98.2 F (36.8 C)  TempSrc:    Oral  SpO2: 94% 93% 99% 100%   No intake or output data in the 24 hours ending 10/27/20 0752  Last 3 Weights 10/20/2020 10/19/2020 10/18/2020  Weight (lbs) 182 lb 1.6 oz 179 lb 10.8 oz 181 lb 10.5 oz  Weight (kg) 82.6 kg 81.5 kg 82.4 kg     There is no height or weight on file to calculate BMI.   General:  Well nourished, well developed, male in no acute distress HEENT: normal Lymph: no adenopathy Neck: JVD -mildly elevated Endocrine:  No thryomegaly Vascular: No carotid bruits; 4/4 extremity pulses 2+  Cardiac:  normal S1, S2; RRR; no murmur Lungs: Decreased breath sounds bases bilaterally, no wheezing, rhonchi or rales  Abd:  soft, nontender, no hepatomegaly  Ext: no edema, dialysis access left upper arm, good palpable pulse Musculoskeletal:  No deformities, BUE and BLE strength normal and equal Skin: warm and dry  Neuro:  CNs 2-12 intact, no focal abnormalities noted Psych:  Normal affect   EKG:  The EKG was personally reviewed and demonstrates:  12/31 ECG is SR, HR 82, LVH w/ repol abnl, 12/24 ECG is Afib, otherwise, no different Telemetry:  Telemetry was personally reviewed and demonstrates: Not currently on   CV studies:   ECHO: 05/13/2020 1. Left ventricular ejection fraction, by estimation, is 55 to 60%. The  left ventricle has normal function. The left ventricle has no regional  wall motion abnormalities. There is moderate left ventricular hypertrophy. Left ventricular diastolic parameters are consistent with Grade I diastolic dysfunction (impaired relaxation).  2. Right ventricular systolic function is normal. The right ventricular  size is normal. There is normal pulmonary artery systolic pressure.  3. Left atrial size was mildly dilated.  4. The mitral valve is abnormal. Moderate mitral valve regurgitation.  5. The aortic valve is tricuspid. Aortic valve regurgitation is not  visualized. Mild aortic valve sclerosis is present, with no evidence of  aortic valve stenosis.   CATH: 05/14/2020 1.  Severe multivessel coronary artery disease with severe calcific distal left main stenosis, severe diffuse ostial/proximal LAD stenosis, severe stenosis of the proximal left circumflex/first OM, and interval occlusion of the distal RCA with left-to-right collaterals 2.  Heavily calcified distal abdominal aorta and bilateral iliac arteries with mild diffuse calcified disease.  No severe iliac lesions identified.  Recommend ongoing medical therapy.  With total occlusion of the RCA and severe calcification of the distal left main and complex disease in the LAD and left circumflex, I think risk of protected  PCI would be extremely high.  I also think his access is marginal for large bore support devices from the groin. Diagnostic Dominance: Right      Laboratory Data:   Chemistry Recent Labs  Lab 10/26/20 2218  NA 137  K 4.5  CL 91*  CO2 28  GLUCOSE 89  BUN 51*  CREATININE 9.06*  CALCIUM 9.6  GFRNONAA 6*  ANIONGAP 18*    Lab Results  Component Value Date   ALT 10 10/26/2020   AST 14 (L) 10/26/2020   ALKPHOS 66 10/26/2020   BILITOT 0.5 10/26/2020   Hematology Recent Labs  Lab 10/26/20 2218 10/27/20 0714  WBC 6.7  --   RBC 3.23*  --   HGB 10.3* 10.7*  HCT 30.7* 31.2*  MCV 95.0  --   MCH 31.9  --   MCHC 33.6  --   RDW 17.7*  --   PLT 237  --    Cardiac Enzymes High Sensitivity Troponin:   Recent Labs  Lab 10/13/20 0420 10/16/20 0818 10/16/20 0858 10/26/20 2218 10/27/20 0051  TROPONINIHS 131* 161* 173* 34* 53*      BNPNo results for input(s): BNP, PROBNP in the last 168 hours.  DDimer No results for input(s): DDIMER in the last 168 hours. TSH:  Lab Results  Component Value Date   TSH 0.825 12/03/2017   Lipids: Lab Results  Component Value Date   CHOL 167 09/21/2020   HDL 44 09/21/2020   LDLCALC 109 (H) 09/21/2020   TRIG 69 09/21/2020   CHOLHDL 3.8 09/21/2020   HgbA1c: Lab Results  Component Value Date   HGBA1C 5.7 (H) 05/12/2020   Magnesium:  Magnesium  Date Value Ref Range Status  09/23/2020 1.8 1.7 - 2.4 mg/dL Final    Comment:    Performed at [facility], [place] [address] [place], [place] [address]     Radiology/Studies:  DG Chest 2 View  Result Date: 10/26/2020 CLINICAL DATA:  Chest pain EXAM: CHEST - 2 VIEW COMPARISON:  10/16/2020 FINDINGS: No focal opacity or pleural effusion. Normal cardiomediastinal silhouette. No pneumothorax. Aortic atherosclerosis. IMPRESSION: No active cardiopulmonary disease. Electronically Signed   By: Donavan Foil M.D.   On: 10/26/2020 22:44    Assessment and Plan:   1.  Chest pain, known  CAD: -During his last admission, he was started on amlodipine 5 mg daily and his metoprolol was increased. -He is also on aspirin, Plavix and Imdur 60 mg daily -He has chest pain regularly despite the meds. -On 12/29, he did not have his nitroglycerin but his daughter was supposed to pick it up on 12/30.  Unclear if he has it as he did not use it prior to admission -Cath from 04/2020 as above, RCA occluded, left main severely calcified, LAD and circumflex disease noted. -PCI risk felt high and he has PAD in his distal abdominal aorta and bilateral iliac arteries, axis felt marginal for large bore support devices -However, as he has had repeated admissions with chest pain, may need to get interventionalists to review the films and see if they can come up with any options - if med rx is only option, consider increasing Imdur - ck lipids  2.  History of A. fib -On 12/25, he was in atrial fibrillation when he left the hospital. -He has not been on anticoagulated due to compliance issues -His metoprolol was increased last admission to a total of 150 mg daily for better rate control. -At some point, he spontaneously converted to sinus rhythm -Continue beta-blocker, follow on telemetry  Otherwise, per IM Principal Problem:   Chest pain Active Problems:   Essential hypertension   ESRD (end stage renal disease) (HCC)   CAD (coronary artery disease)   PAF (paroxysmal atrial  fibrillation) [facility])  For questions or updates, please contact Britt Please consult [email or web address] for contact info under Cardiology/STEMI.   Signed, Rosaria Ferries, PA-C  10/27/2020 7:52 AM As above, patient seen and examined.  Briefly he is a 65 year old male with past medical history of coronary artery disease, previous PEA arrest, end-stage renal disease on dialysis, paroxysmal atrial fibrillation not on anticoagulation due to compliance issues, hypertension, hyperlipidemia for evaluation of chest pain.  Patient  previously seen by Dr. Darcey Nora in 2019 and felt not to be a candidate for coronary artery bypass and graft.  Last catheterization July 2021 showed 70% severely calcified distal left main stenosis, 90% ostial LAD stenosis, severe stenosis in the proximal circumflex/OM and occlusion of the distal right coronary artery with left-to-right collaterals.  Medical therapy was recommended as PCI was felt to be extremely high risk and access would be marginal for large bore support devices from the groin.  Patient has continued to have chest pain.  He states he has chest pain daily.  It is in the left chest area and described as sharp without radiation.  He describes some nausea and dyspnea but no diaphoresis.  Typically lasts 30 minutes.  Typically occurs after eating or drinking water.  It improves with ambulation.  Troponin is 34, 53 and 76.  Creatinine 9.06.  Electrocardiogram shows sinus rhythm with inferolateral T wave inversion, septal infarct cannot be excluded; T wave inversion unchanged compared to previous.  Hemoglobin is 10.7.  1 chest pain-symptoms are atypical.  They occur after eating and drinking water and improved with activities.  Not clear to me that this is cardiac despite severe underlying coronary disease.  Enzymes are not consistent with acute coronary syndrome and electrocardiogram shows no new ST changes.  Would add medication for gastroesophageal reflux disease to see if this improves his symptoms.  2 coronary artery disease-patient has severe coronary disease as outlined above.  He has previously been turned down by cardiothoracic surgery.  He was also felt to be too high risk for PCI in July 2021.  We will continue aspirin, Plavix and statin.  We will maximize antianginal therapy including continuing amlodipine, increasing Toprol to 200 mg daily and increasing isosorbide to 90 mg daily.  I will have interventionalists review his films from July on Monday morning.  However I think medical  therapy is likely only option.  3 history of paroxysmal atrial fibrillation-patient remains in sinus rhythm.  Continue Toprol.  Felt not to be a candidate for anticoagulation given history of intermittent noncompliance.  4 end-stage renal disease-dialysis per nephrology.  5 hypertension-blood pressure elevated.  Medication changes as outlined above.  Kirk Ruths, MD

## 2020-10-27 NOTE — Plan of Care (Signed)

## 2020-10-27 NOTE — Progress Notes (Signed)
PROGRESS NOTE  Kenneth [name] CHE:[id number] DOB: 1955-11-02 DOA: 10/26/2020 PCP: [name], MD  HPI/Recap of past 24 hours: Patient is a 65 year old male with past medical history of severe multivessel CAD-not felt to be a good candidate for PCI/CABG and medically managed, chronic combined systolic/diastolic heart failure, paroxysmal atrial fibrillation, end-stage renal disease on hemodialysis TTS with recent hospitalization from 12/21-12/25 for chest pain who presented to the emergency room with complaints of severe left-sided chest pain and a reported episode of hematemesis.  In the emergency room, hemoglobin stable with mild elevations in troponin and labs consistent with end-stage renal disease.  Admitted to the hospitalist service.  This morning, patient still having some chest pain although a little bit better, from 9/10-4/10.  Seen by cardiology and after evaluation, symptoms may be related to GI.  Blood pressure medications adjusted.    Assessment/Plan: Principal Problem:   Chest pain: Possibly noncardiac and GI related versus unstable angina.  If GI related, will add Maalox and PPI and if cardiac related, medications adjusted as below. Active Problems:   Essential hypertension: Elevated in the context of pain as well as needing dialysis today.  Questionable hematemesis: Hemoglobin went up in follow-up labs.  He has underlying anemia of chronic disease.    Alcoholism ([place]): No longer drinks.  Sober times several years.    Acute on chronic diastolic CHF (congestive heart failure) ([place]): Managing volumes by dialysis.    ESRD (end stage renal disease) [facility]): Nephrology consulted, getting dialysis today.  This will keep him on his TTS schedule    CAD (coronary artery disease): Appreciate cardiology help.  Has diffuse multivessel disease.  He is not a candidate for cardiac catheterization or CABG given access would be marginal for large bore support devices.  Medically managing.   Toprol increased to 200 mg daily and Imdur increased to 90.    PAF (paroxysmal atrial fibrillation) ([place]): Currently normal sinus rhythm   Code Status: Full code  Family Communication: Left message for family  Disposition Plan: Anticipate discharge tomorrow once we are sure that he is tolerating medication adjustments   Consultants:  Nephrology  Cardiology  Procedures:  Hemodialysis 1/1  Antimicrobials:  None  DVT prophylaxis: SCDs   Objective: Vitals:   10/27/20 0845 10/27/20 0851  BP: (!) 177/105 (!) 176/94  Pulse: 71 77  Resp: 18 15  Temp: 97.6 F (36.4 C)   SpO2: 98% 97%   No intake or output data in the 24 hours ending 10/27/20 1040 There were no vitals filed for this visit. There is no height or weight on file to calculate BMI.  Exam:   General: Alert and oriented x3, no acute distress  Cardiovascular: Regular rate and rhythm, N3-I1, 2/6 systolic ejection murmur  Respiratory: Clear to auscultation bilaterally  Abdomen: Soft, nontender, nondistended, positive bowel sounds  Musculoskeletal: No clubbing or cyanosis or edema, fistula noted  Skin: No skin breaks, tears or lesions  Psychiatry: Appropriate, no evidence of psychoses   Data Reviewed: CBC: Recent Labs  Lab 10/26/20 2218 10/27/20 0714  WBC 6.7  --   HGB 10.3* 10.7*  HCT 30.7* 31.2*  MCV 95.0  --   PLT 237  --    Basic Metabolic Panel: Recent Labs  Lab 10/26/20 2218  NA 137  K 4.5  CL 91*  CO2 28  GLUCOSE 89  BUN 51*  CREATININE 9.06*  CALCIUM 9.6   GFR: Estimated Creatinine Clearance: 8.3 mL/min (A) (by C-G formula based on  SCr of 9.06 mg/dL (H)). Liver Function Tests: Recent Labs  Lab 10/26/20 2251  AST 14*  ALT 10  ALKPHOS 66  BILITOT 0.5  PROT 7.8  ALBUMIN 3.4*   No results for input(s): LIPASE, AMYLASE in the last 168 hours. No results for input(s): AMMONIA in the last 168 hours. Coagulation Profile: Recent Labs  Lab 10/26/20 2251  INR 1.0    Cardiac Enzymes: No results for input(s): CKTOTAL, CKMB, CKMBINDEX, TROPONINI in the last 168 hours. BNP (last 3 results) No results for input(s): PROBNP in the last 8760 hours. HbA1C: No results for input(s): HGBA1C in the last 72 hours. CBG: No results for input(s): GLUCAP in the last 168 hours. Lipid Profile: No results for input(s): CHOL, HDL, LDLCALC, TRIG, CHOLHDL, LDLDIRECT in the last 72 hours. Thyroid Function Tests: No results for input(s): TSH, T4TOTAL, FREET4, T3FREE, THYROIDAB in the last 72 hours. Anemia Panel: No results for input(s): VITAMINB12, FOLATE, FERRITIN, TIBC, IRON, RETICCTPCT in the last 72 hours. Urine analysis:    Component Value Date/Time   COLORURINE YELLOW 05/12/2016 0834   APPEARANCEUR CLOUDY (A) 05/12/2016 0834   LABSPEC 1.014 05/12/2016 0834   PHURINE 5.0 05/12/2016 0834   GLUCOSEU NEGATIVE 05/12/2016 0834   HGBUR MODERATE (A) 05/12/2016 0834   BILIRUBINUR NEGATIVE 05/12/2016 0834   KETONESUR NEGATIVE 05/12/2016 0834   PROTEINUR 100 (A) 05/12/2016 0834   UROBILINOGEN 0.2 12/09/2013 0013   NITRITE NEGATIVE 05/12/2016 0834   LEUKOCYTESUR NEGATIVE 05/12/2016 0834   Sepsis Labs: @LABRCNTIP (procalcitonin:4,lacticidven:4)  ) Recent Results (from the past 240 hour(s))  Resp Panel by RT-PCR (Flu A&B, Covid) Nasopharyngeal Swab     Status: None   Collection Time: 10/27/20  4:23 AM   Specimen: Nasopharyngeal Swab; Nasopharyngeal(NP) swabs in vial transport medium  Result Value Ref Range Status   SARS Coronavirus 2 by RT PCR NEGATIVE NEGATIVE Final    Comment: (NOTE) SARS-CoV-2 target nucleic acids are NOT DETECTED.  The SARS-CoV-2 RNA is generally detectable in upper respiratory specimens during the acute phase of infection. The lowest concentration of SARS-CoV-2 viral copies this assay can detect is 138 copies/mL. A negative result does not preclude SARS-Cov-2 infection and should not be used as the sole basis for treatment or other patient  management decisions. A negative result may occur with  improper specimen collection/handling, submission of specimen other than nasopharyngeal swab, presence of viral mutation(s) within the areas targeted by this assay, and inadequate number of viral copies(<138 copies/mL). A negative result must be combined with clinical observations, patient history, and epidemiological information. The expected result is Negative.  Fact Sheet for Patients:  EntrepreneurPulse.com.au  Fact Sheet for Healthcare Providers:  IncredibleEmployment.be  This test is no t yet approved or cleared by the [place] FDA and  has been authorized for detection and/or diagnosis of SARS-CoV-2 by FDA under an Emergency Use Authorization (EUA). This EUA will remain  in effect (meaning this test can be used) for the duration of the COVID-19 declaration under Section 564(b)(1) of the Act, 21 U.S.C.section 360bbb-3(b)(1), unless the authorization is terminated  or revoked sooner.       Influenza A by PCR NEGATIVE NEGATIVE Final   Influenza B by PCR NEGATIVE NEGATIVE Final    Comment: (NOTE) The Xpert Xpress SARS-CoV-2/FLU/RSV plus assay is intended as an aid in the diagnosis of influenza from Nasopharyngeal swab specimens and should not be used as a sole basis for treatment. Nasal washings and aspirates are unacceptable for Xpert Xpress SARS-CoV-2/FLU/RSV testing.  Fact  Sheet for Patients: EntrepreneurPulse.com.au  Fact Sheet for Healthcare Providers: IncredibleEmployment.be  This test is not yet approved or cleared by the [place] FDA and has been authorized for detection and/or diagnosis of SARS-CoV-2 by FDA under an Emergency Use Authorization (EUA). This EUA will remain in effect (meaning this test can be used) for the duration of the COVID-19 declaration under Section 564(b)(1) of the Act, 21 U.S.C. section 360bbb-3(b)(1),  unless the authorization is terminated or revoked.  Performed at [facility], [place] [address], [place], [place] [address]       Studies: DG Chest 2 View  Result Date: 10/26/2020 CLINICAL DATA:  Chest pain EXAM: CHEST - 2 VIEW COMPARISON:  10/16/2020 FINDINGS: No focal opacity or pleural effusion. Normal cardiomediastinal silhouette. No pneumothorax. Aortic atherosclerosis. IMPRESSION: No active cardiopulmonary disease. Electronically Signed   By: Donavan Foil M.D.   On: 10/26/2020 22:44    Scheduled Meds: . amLODipine  5 mg Oral Daily  . aspirin  81 mg Oral Daily  . atorvastatin  80 mg Oral Daily  . clopidogrel  75 mg Oral Daily  . isosorbide mononitrate  90 mg Oral Daily  . metoprolol succinate  100 mg Oral BID  . pantoprazole  40 mg Oral Q0600  . Rena-Vite Rx  1 tablet Oral Daily    Continuous Infusions:   LOS: 0 days     Annita Brod, MD [facility]   10/27/2020, 10:40 AM

## 2020-10-28 DIAGNOSIS — R072 Precordial pain: Secondary | ICD-10-CM | POA: Diagnosis not present

## 2020-10-28 MED ORDER — PANTOPRAZOLE SODIUM 40 MG PO TBEC: 40.0000 mg | DELAYED_RELEASE_TABLET | Freq: Every day | ORAL | 0 refills | Status: DC

## 2020-10-28 MED ORDER — MENTHOL 3 MG MT LOZG
1.0000 | LOZENGE | OROMUCOSAL | Status: DC | PRN
  Administered 2020-10-28: 3 mg via ORAL
  Filled 2020-10-28: qty 9

## 2020-10-28 MED ORDER — FAMOTIDINE 20 MG PO TABS: 20.0000 mg | ORAL_TABLET | Freq: Two times a day (BID) | ORAL | 0 refills | Status: DC | PRN

## 2020-10-28 MED ORDER — ISOSORBIDE MONONITRATE ER 30 MG PO TB24: 90.0000 mg | ORAL_TABLET | Freq: Every day | ORAL | 0 refills | Status: DC

## 2020-10-28 MED ORDER — METOPROLOL SUCCINATE ER 100 MG PO TB24: 100.0000 mg | ORAL_TABLET | Freq: Two times a day (BID) | ORAL | 0 refills | Status: DC

## 2020-10-28 NOTE — Plan of Care (Signed)

## 2020-10-28 NOTE — Progress Notes (Signed)
[facility] Progress Note  Subjective: seen in room HD yest /w 2 L off. No CP this am, no c/o's.   Vitals:   10/28/20 0259 10/28/20 0732 10/28/20 0908 10/28/20 1106  BP: 130/67 (!) 120/59  137/83  Pulse: 72 70  76  Resp: 18 20 14 18   Temp: 98.1 F (36.7 C) 97.7 F (36.5 C)  98.1 F (36.7 C)  TempSrc: Oral Oral  Oral  SpO2: 98% 97%  99%  Weight:      Height:        Exam:   alert, nad   no jvd  Chest cta bilat  Cor reg no RG  Abd soft ntnd no ascites   Ext no LE edema   Alert, NF, ox3   L AVF +bruit    OP HD: TTS [facility]   4h  82.5kg   400/800  2/2bath  AVF  15g  Hep 2400  Mircera 75 mcg IVP q 2 weeks Calcitirol 2.15mcg PO q HD Sensipar 30 mcg PO q HD  Last HG 12/30, completed full HD but left 1.2kg above EDW  Assessment/Plan: 1.  CAD: Recurrent admissions for the same, cardiology maximizing medical therapy as patient is not a candidate for CABG or PCI. Cardiology recommending trial of GERD treatment. Per cardiology/primary team. Feels better today.  2.  A. Fib with RVR: RRR today. Not on anticoagulation.  3.  ESRD:  Usually TTS schedule. Last HD was 12/30 as outpt. Had HD here last night, 10/27/20.  4.  Hypertension/volume: No edema on CXR. Cardiology titrating BP meds to maximize medical therapy for CAD. Not reaching EDW outpatient. After HD yest is close to dry wt.  5.  Anemia: Hgb at goal. No indication for ESA at this time. 6.  Metabolic bone disease: Calcium borderline high. Continue VDRA and sensipar, follow closely, may need to decrease calcitriol dose. No phos reported yet. Pt not on binders.  7.  Nutrition:  Renal diet. No alb yet.  8. Dispo- stable for d/c from renal standpoint     Rob Teal Bontrager 10/28/2020, 2:35 PM   Recent Labs  Lab 10/26/20 2218 10/27/20 0714 10/27/20 1945  K 4.5  --  3.7  BUN 51*  --  19  CREATININE 9.06*  --  5.11*  CALCIUM 9.6  --  8.7*  PHOS  --   --  3.4  HGB 10.3* 10.7* 11.2*   Inpatient medications: .  amLODipine  5 mg Oral Daily  . aspirin EC  81 mg Oral Daily  . atorvastatin  80 mg Oral Daily  . Chlorhexidine Gluconate Cloth  6 each Topical Q0600  . clopidogrel  75 mg Oral Daily  . isosorbide mononitrate  90 mg Oral Daily  . metoprolol succinate  100 mg Oral BID  . multivitamin  1 tablet Oral QHS  . pantoprazole  40 mg Oral Q0600   . sodium chloride    . sodium chloride     sodium chloride, sodium chloride, acetaminophen, alteplase, alteplase, famotidine, heparin, heparin, lidocaine (PF), lidocaine-prilocaine, nitroGLYCERIN, pentafluoroprop-tetrafluoroeth

## 2020-10-28 NOTE — Progress Notes (Signed)
Dr. Jamse Arn ordered discharging patient to home as well as asking any barrier as far as know. Answered the question that need clear by cardiology team. Because this nurse was in the room when cardiologist told patient in the morning, he will be stay today, review by cardiac intervention team then adjusting medications tomorrow. Dr. Jamse Arn called and talked this nurse to follow the order and she won't call cardiology team, she made a decision to discharge patient. Paging cardiology NP Mancel Bale due to patient's safety on cardiac standpoint. NP Mancel Bale didn't want to send patient to home according the cardiology team note, not clear to send him home. This nurse asked cardiac NP to call Dr. Jamse Arn for consider hold discharge patient today. Cardiac NP called this nurse back and Dr. Jamse Arn still wanted to send him home. This nurse discuss with charge nurse regarding this matter due to patient's safety. Charge nurse talked Retail buyer and patient is not going home today until cardiology team adjusting patient's medication tomorrow. HS [employer]

## 2020-10-28 NOTE — Progress Notes (Signed)
Progress Note  Patient Name: Kenneth [name] Date of Encounter: 10/28/2020  [facility] Cardiologist: Peter [name], MD   Subjective   No CP; no dyspnea  Inpatient Medications    Scheduled Meds: . amLODipine  5 mg Oral Daily  . aspirin EC  81 mg Oral Daily  . atorvastatin  80 mg Oral Daily  . Chlorhexidine Gluconate Cloth  6 each Topical Q0600  . clopidogrel  75 mg Oral Daily  . isosorbide mononitrate  90 mg Oral Daily  . metoprolol succinate  100 mg Oral BID  . multivitamin  1 tablet Oral QHS  . pantoprazole  40 mg Oral Q0600   Continuous Infusions: . sodium chloride    . sodium chloride     PRN Meds: sodium chloride, sodium chloride, acetaminophen, alteplase, alteplase, famotidine, heparin, heparin, lidocaine (PF), lidocaine-prilocaine, nitroGLYCERIN, pentafluoroprop-tetrafluoroeth   Vital Signs    Vitals:   10/27/20 1925 10/27/20 2115 10/27/20 2327 10/28/20 0259  BP: 109/60 134/67 (!) 100/54 130/67  Pulse: 70 76 74 72  Resp: 20  15 18   Temp: 97.6 F (36.4 C)  97.6 F (36.4 C) 98.1 F (36.7 C)  TempSrc: Oral  Oral Oral  SpO2: 97%  98% 98%  Weight:      Height:        Intake/Output Summary (Last 24 hours) at 10/28/2020 0717 Last data filed at 10/28/2020 0333 Gross per 24 hour  Intake 590 ml  Output 2003 ml  Net -1413 ml   Last 3 Weights 10/27/2020 10/27/2020 10/27/2020  Weight (lbs) 185 lb 3 oz 189 lb 9.5 oz 186 lb 11.7 oz  Weight (kg) 84 kg 86 kg 84.7 kg      Telemetry    Sinus with PVCs and 6 beats NSVT- Personally Reviewed  Physical Exam   GEN: No acute distress.   Neck: No JVD Cardiac: RRR, no murmurs, rubs, or gallops.  Respiratory: Clear to auscultation bilaterally. GI: Soft, nontender, non-distended  MS: No edema Neuro:  Nonfocal  Psych: Normal affect   Labs    High Sensitivity Troponin:   Recent Labs  Lab 10/16/20 0818 10/16/20 0858 10/26/20 2218 10/27/20 0051 10/27/20 0714  TROPONINIHS 161* 173* 34* 53* 76*       Chemistry Recent Labs  Lab 10/26/20 2218 10/26/20 2251 10/27/20 1945  NA 137  --  133*  K 4.5  --  3.7  CL 91*  --  92*  CO2 28  --  29  GLUCOSE 89  --  101*  BUN 51*  --  19  CREATININE 9.06*  --  5.11*  CALCIUM 9.6  --  8.7*  PROT  --  7.8  --   ALBUMIN  --  3.4* 3.3*  AST  --  14*  --   ALT  --  10  --   ALKPHOS  --  66  --   BILITOT  --  0.5  --   GFRNONAA 6*  --  12*  ANIONGAP 18*  --  12     Hematology Recent Labs  Lab 10/26/20 2218 10/27/20 0714 10/27/20 1945  WBC 6.7  --  8.5  RBC 3.23*  --  3.43*  HGB 10.3* 10.7* 11.2*  HCT 30.7* 31.2* 31.8*  MCV 95.0  --  92.7  MCH 31.9  --  32.7  MCHC 33.6  --  35.2  RDW 17.7*  --  17.8*  PLT 237  --  235    Radiology  DG Chest 2 View  Result Date: 10/26/2020 CLINICAL DATA:  Chest pain EXAM: CHEST - 2 VIEW COMPARISON:  10/16/2020 FINDINGS: No focal opacity or pleural effusion. Normal cardiomediastinal silhouette. No pneumothorax. Aortic atherosclerosis. IMPRESSION: No active cardiopulmonary disease. Electronically Signed   By: Donavan Foil M.D.   On: 10/26/2020 22:44    Patient Profile     65 y.o. male with past medical history of coronary artery disease, previous PEA arrest, end-stage renal disease on dialysis, paroxysmal atrial fibrillation not on anticoagulation due to compliance issues, hypertension, hyperlipidemia for evaluation of chest pain.  Patient previously seen by Dr. Darcey Nora in 2019 and felt not to be a candidate for coronary artery bypass and graft.  Last catheterization July 2021 showed 70% severely calcified distal left main stenosis, 90% ostial LAD stenosis, severe stenosis in the proximal circumflex/OM and occlusion of the distal right coronary artery with left-to-right collaterals.  Medical therapy was recommended as PCI was felt to be extremely high risk and access would be marginal for large bore support devices from the groin.  Patient has continued to have chest pain.    Assessment & Plan     1 chest pain-Pt denies chest pain this morning.  Symptoms at time of admission were felt to be atypical and possibly related to gastroesophageal reflux disease. We did add Protonix. He also has severe underlying coronary artery disease.  We have also increased antianginals.  Note enzymes are not consistent with acute coronary syndrome.  Will have interventionalists review previous cath films tomorrow morning though medical therapy is likely only option (previously turned down for coronary artery bypass and graft and PCI felt to be too high risk).   2 coronary artery disease-patient previously felt not to be a candidate for coronary artery bypass and graft by cardiothoracic surgery.  He was also felt to be too high risk for PCI in July 2021. We will have interventions review films tomorrow morning but medical therapy likely best option. We will continue aspirin, Plavix and statin. Continue Toprol, Imdur and amlodipine as tolerated by blood pressure.  3 history of paroxysmal atrial fibrillation-patient remains in sinus rhythm. Continue Toprol.  Felt not to be a candidate for anticoagulation given history of intermittent noncompliance.  4 end-stage renal disease-dialysis per nephrology.  5 hypertension-blood pressure was borderline yesterday after being dialyzed.  Will decrease isosorbide back to 60 mg daily.  Continue higher dose Toprol and amlodipine.  Follow blood pressure and adjust regimen as needed.  For questions or updates, please contact [place] Please consult [email or web address] for contact info under        Signed, Kirk Ruths, MD  10/28/2020, 7:17 AM

## 2020-10-29 DIAGNOSIS — I5033 Acute on chronic diastolic (congestive) heart failure: Secondary | ICD-10-CM

## 2020-10-29 DIAGNOSIS — I25119 Atherosclerotic heart disease of native coronary artery with unspecified angina pectoris: Secondary | ICD-10-CM

## 2020-10-29 DIAGNOSIS — R072 Precordial pain: Secondary | ICD-10-CM | POA: Diagnosis not present

## 2020-10-29 DIAGNOSIS — N186 End stage renal disease: Secondary | ICD-10-CM | POA: Diagnosis not present

## 2020-10-29 NOTE — Progress Notes (Signed)
[facility] Progress Note  Subjective:   No c/o this AM  Cardiology plans med mgmt   Vitals:   10/28/20 2344 10/29/20 0431 10/29/20 0728 10/29/20 0800  BP: (!) 123/56 121/74 130/60   Pulse: 78 84 84   Resp: 16 20 20 20   Temp: 99.7 F (37.6 C) 99.9 F (37.7 C) 98.5 F (36.9 C)   TempSrc: Oral Oral Oral   SpO2: 98% 100% 96%   Weight:      Height:        Exam:  resting, nad  no jvd  Chest cta bilat  RRR   s/nt   Ext no LE edema   Alert, NF, ox3   LUE AVF +bruit    OP HD: TTS [facility]   4h  82.5kg   400/800  2/2bath  AVF  15g  Hep 2400  Mircera 75 mcg IVP q 2 weeks Calcitirol 2.74mcg PO q HD Sensipar 30 mcg PO q HD  Assessment/Plan: 1.  CAD: Recurrent admissions for the same, cardiology maximizing medical therapy as patient is not a candidate for CABG or PCI. Stable 2. A. Fib with RVR: Sinus today, Rate control  No on AC 3. ESRD:  [facility] AVF TTS schedule. On schedule 4.  Hypertension/volume: BP ok, push to EDW 5.  Anemia: Hgb at goal. No indication for ESA at this time. 6.  Metabolic bone disease: Calcium borderline high. Outpt mgmt. Ca and P at goal 7.  Nutrition:  Renal diet. No alb yet.  8. Dispo- stable for d/c from renal standpoint  Rexene Agent  10/29/2020, 9:30 AM   Recent Labs  Lab 10/26/20 2218 10/27/20 0714 10/27/20 1945  K 4.5  --  3.7  BUN 51*  --  19  CREATININE 9.06*  --  5.11*  CALCIUM 9.6  --  8.7*  PHOS  --   --  3.4  HGB 10.3* 10.7* 11.2*   Inpatient medications: . amLODipine  5 mg Oral Daily  . aspirin EC  81 mg Oral Daily  . atorvastatin  80 mg Oral Daily  . Chlorhexidine Gluconate Cloth  6 each Topical Q0600  . clopidogrel  75 mg Oral Daily  . isosorbide mononitrate  90 mg Oral Daily  . metoprolol succinate  100 mg Oral BID  . multivitamin  1 tablet Oral QHS  . pantoprazole  40 mg Oral Q0600   . sodium chloride    . sodium chloride     sodium chloride, sodium chloride, acetaminophen, alteplase, alteplase,  famotidine, heparin, heparin, lidocaine (PF), lidocaine-prilocaine, menthol-cetylpyridinium, nitroGLYCERIN, pentafluoroprop-tetrafluoroeth

## 2020-10-29 NOTE — Consult Note (Signed)
   [facility] Inpatient Consult   10/29/2020  [name] May 21, 1956 [id number]  [place] [place] [facility] [ACO] Patient: Medicare   Unplanned Readmission Score: Extreme High Risk noted. Patient with 6 hospitalizations in the past 6 months and readmitted within  Less than 7 days.  Patient was assessed for readmission [place] [facility] for community services. Patient was previously outreached  with [place] to his daughter [name] from an [place] Discharge .   Met with the patient at the bedside. Spoke with patient regarding post hospital follow up again. Patient not very talkative states "you can talk to my daughter [name]."  Review of Inpatient Transition of Care RNCM noted.   Plan: Informed TOC team of [facility] plans to follow telephonically.  Will assign for follow up post hospital support and complex disease management. Will alert that patient has dialysis on TTS schedule at [facility] per noted.  Of note, [facility] Care Management services does not replace or interfere with any services that are arranged by inpatient [facility] care management team.   For additional questions or referrals please contact:  [name], RN BSN [facility] Liaison  [phone] business mobile phone Toll free office [phone]  Fax number: [phone] [email or web address]

## 2020-10-29 NOTE — Progress Notes (Signed)
Progress Note  Patient Name: Kenneth [name] Date of Encounter: 10/29/2020  [facility] Cardiologist: Peter [name], MD   Subjective   Complains of atypical nonanginal chest pain  Inpatient Medications    Scheduled Meds: . amLODipine  5 mg Oral Daily  . aspirin EC  81 mg Oral Daily  . atorvastatin  80 mg Oral Daily  . Chlorhexidine Gluconate Cloth  6 each Topical Q0600  . clopidogrel  75 mg Oral Daily  . isosorbide mononitrate  90 mg Oral Daily  . metoprolol succinate  100 mg Oral BID  . multivitamin  1 tablet Oral QHS  . pantoprazole  40 mg Oral Q0600   Continuous Infusions: . sodium chloride    . sodium chloride     PRN Meds: sodium chloride, sodium chloride, acetaminophen, alteplase, alteplase, famotidine, heparin, heparin, lidocaine (PF), lidocaine-prilocaine, menthol-cetylpyridinium, nitroGLYCERIN, pentafluoroprop-tetrafluoroeth   Vital Signs    Vitals:   10/28/20 2344 10/29/20 0431 10/29/20 0728 10/29/20 0800  BP: (!) 123/56 121/74 130/60   Pulse: 78 84 84   Resp: 16 20 20 20   Temp: 99.7 F (37.6 C) 99.9 F (37.7 C) 98.5 F (36.9 C)   TempSrc: Oral Oral Oral   SpO2: 98% 100% 96%   Weight:      Height:        Intake/Output Summary (Last 24 hours) at 10/29/2020 0907 Last data filed at 10/28/2020 2147 Gross per 24 hour  Intake 360 ml  Output -  Net 360 ml   Last 3 Weights 10/27/2020 10/27/2020 10/27/2020  Weight (lbs) 185 lb 3 oz 189 lb 9.5 oz 186 lb 11.7 oz  Weight (kg) 84 kg 86 kg 84.7 kg      Telemetry    Sinus rhythm with PVCs- Personally Reviewed  ECG    Not performed today- Personally Reviewed  Physical Exam   GEN: No acute distress.   Neck: No JVD Cardiac: RRR, no murmurs, rubs, or gallops.  Respiratory: Clear to auscultation bilaterally. GI: Soft, nontender, non-distended  MS: No edema; No deformity. Neuro:  Nonfocal  Psych: Normal affect   Labs    High Sensitivity Troponin:   Recent Labs  Lab 10/16/20 0818 10/16/20 0858  10/26/20 2218 10/27/20 0051 10/27/20 0714  TROPONINIHS 161* 173* 34* 53* 76*      Chemistry Recent Labs  Lab 10/26/20 2218 10/26/20 2251 10/27/20 1945  NA 137  --  133*  K 4.5  --  3.7  CL 91*  --  92*  CO2 28  --  29  GLUCOSE 89  --  101*  BUN 51*  --  19  CREATININE 9.06*  --  5.11*  CALCIUM 9.6  --  8.7*  PROT  --  7.8  --   ALBUMIN  --  3.4* 3.3*  AST  --  14*  --   ALT  --  10  --   ALKPHOS  --  66  --   BILITOT  --  0.5  --   GFRNONAA 6*  --  12*  ANIONGAP 18*  --  12     Hematology Recent Labs  Lab 10/26/20 2218 10/27/20 0714 10/27/20 1945  WBC 6.7  --  8.5  RBC 3.23*  --  3.43*  HGB 10.3* 10.7* 11.2*  HCT 30.7* 31.2* 31.8*  MCV 95.0  --  92.7  MCH 31.9  --  32.7  MCHC 33.6  --  35.2  RDW 17.7*  --  17.8*  PLT 237  --  235    BNPNo results for input(s): BNP, PROBNP in the last 168 hours.   DDimer No results for input(s): DDIMER in the last 168 hours.   Radiology    No results found.  Cardiac Studies   None  Patient Profile     65 y.o. male with past medical history of coronary artery disease, previous PEA arrest, end-stage renal disease on dialysis, paroxysmal atrial fibrillation not on anticoagulation due to compliance issues, hypertension, hyperlipidemia for evaluation of chest pain. Patient previously seen by Dr. Darcey Nora in 2019 and felt not to be a candidate for coronary artery bypass and graft. Last catheterization July 2021 showed 70% severely calcified distal left main stenosis, 90% ostial LAD stenosis, severe stenosis in the proximal circumflex/OM and occlusion of the distal right coronary artery with left-to-right collaterals. Medical therapy was recommended as PCI was felt to be extremely high risk and access would be marginal for large bore support devices from the groin. Patient has continued to have chest pain.   Assessment & Plan    1: Chest pain-no chest pain when I saw him this morning but has had chest pain off and on  since hospitalization.  I reviewed his coronary angiograms from July showing an occluded calcified dominant RCA with left-to-right collaterals, high-grade distal left main disease, ostial LAD and circumflex disease not suitable for percutaneous intervention.  He does have calcified iliac vessels making large access hemodynamic support somewhat difficult.  He was also felt not to be a CABG candidate as well.  The plan is to optimize his antianginals which he is on.  Nothing further to offer at this time.  He is on dual antiplatelet therapy as well as beta-blocker, long-term oral nitrate and amlodipine.  2: PAF-currently in sinus rhythm.  He is on Toprol.  He is not felt to be a good oral anticoagulation candidate because of noncompliance  3: End-stage renal disease, on dialysis per nephrology  4: Essential hypertension-on Toprol and amlodipine well-controlled.  He does have episodes of hypotension which may correspond to dialysis.  There is very little room to add any additional medicines.  Unfortunately, from a cardiology point of view we have very little else to add.  I agree with medical therapy.  He is not a CABG or PCI candidate.  Follow-up with Dr. [name] as an outpatient after discharge.  We will be available for any further questions but will sign off at this time.  For questions or updates, please contact [place] Please consult [email or web address] for contact info under        Signed, [place] Burow, MD  10/29/2020, 9:07 AM

## 2020-10-29 NOTE — TOC Initial Note (Signed)
Transition of Care [facility]) - Initial/Assessment Note    Patient Details  Name: Kenneth [name] MRN: [id number] Date of Birth: 12-26-1955  Transition of Care [facility]) CM/SW Contact:    Zenon Mayo, RN Phone Number: 10/29/2020, 9:38 AM  Clinical Narrative:                 Patient is for dc today, he states he will need assistance with transportation home. NCM will assist him thru [employer].  Expected Discharge Plan: Home/Self Care Barriers to Discharge: No Barriers Identified   Patient Goals and CMS Choice Patient states their goals for this hospitalization and ongoing recovery are:: take better care of himself   Choice offered to / list presented to : NA  Expected Discharge Plan and Services Expected Discharge Plan: Home/Self Care In-house Referral: NA Discharge Planning Services: CM Consult Post Acute Care Choice: NA Living arrangements for the past 2 months: Apartment Expected Discharge Date: 10/28/20                 DME Agency: NA       HH Arranged: NA          Prior Living Arrangements/Services Living arrangements for the past 2 months: Apartment Lives with:: Adult Children Patient language and need for interpreter reviewed:: Yes Do you feel safe going back to the place where you live?: Yes      Need for Family Participation in Patient Care: Yes (Comment) Care giver support system in place?: Yes (comment)   Criminal Activity/Legal Involvement Pertinent to Current Situation/Hospitalization: No - Comment as needed  Activities of Daily Living Home Assistive Devices/Equipment: None ADL Screening (condition at time of admission) Patient's cognitive ability adequate to safely complete daily activities?: Yes Is the patient deaf or have difficulty hearing?: No Does the patient have difficulty seeing, even when wearing glasses/contacts?: No Does the patient have difficulty concentrating, remembering, or making decisions?: No Patient able to express  need for assistance with ADLs?: Yes Does the patient have difficulty dressing or bathing?: No Independently performs ADLs?: Yes (appropriate for developmental age) Does the patient have difficulty walking or climbing stairs?: No Weakness of Legs: None Weakness of Arms/Hands: None  Permission Sought/Granted                  Emotional Assessment   Attitude/Demeanor/Rapport: Engaged Affect (typically observed): Appropriate Orientation: : Oriented to Self,Oriented to Place,Oriented to  Time,Oriented to Situation Alcohol / Substance Use: Not Applicable Psych Involvement: No (comment)  Admission diagnosis:  Chest pain [R07.9] Chest pain, unspecified type [R07.9] Acute on chronic diastolic CHF (congestive heart failure) ([place]) [I50.33] Patient Active Problem List   Diagnosis Date Noted  . Chest pain 10/27/2020  . PAF (paroxysmal atrial fibrillation) ([place]) 10/27/2020  . Troponin level elevated   . Angina at rest [facility]) 09/24/2020  . Accelerated hypertension 09/24/2020  . Dyslipidemia 09/24/2020  . NSTEMI (non-ST elevated myocardial infarction) ([place]) 05/12/2020  . Non-ST elevated myocardial infarction ([place]) 05/12/2020  . DNR (do not resuscitate) discussion   . Palliative care by specialist   . Goals of care, counseling/discussion   . CAD (coronary artery disease)   . STEMI (ST elevation myocardial infarction) ([place]) 09/17/2018  . Unstable angina ([place])   . ACS (acute coronary syndrome) ([place]) 09/01/2018  . ESRD (end stage renal disease) ([place]) 07/20/2018  . Acute respiratory failure ([place]) 05/11/2018  . Cardiac arrest ([place]) 03/09/2018  . Acute encephalopathy   . Acute on chronic respiratory failure ([place]) 02/16/2018  .  Tobacco dependence 02/16/2018  . Pulmonary edema 01/26/2018  . Acute respiratory failure with hypoxia ([place]) 01/26/2018  . Atrial fibrillation and flutter ([place])   . Shortness of breath   . Acute on chronic diastolic CHF (congestive heart failure) ([place])   .  Hypothermia 10/08/2016  . ESRD on hemodialysis ([place]) 10/08/2016  . Fall 10/08/2016  . Syncope 10/07/2016  . Near syncope 07/07/2016  . Cardiomyopathy ([place]) 07/07/2016  . Ventricular tachycardia, non-sustained ([place]) 07/06/2016  . Hypoglycemia 07/06/2016  . CKD (chronic kidney disease) stage 5, GFR less than 15 ml/min (HCC)   . Alcoholism ([place]) 05/13/2016  . Hypertensive emergency 12/08/2013  . Cardiomyopathy secondary 10/09/2011  . Essential hypertension 10/09/2011  . Alcohol withdrawal delirium ([place]) 09/25/2011  . Chronic diastolic CHF (congestive heart failure) ([place]) 09/24/2011   PCP:  [name], MD Pharmacy:   [facility], [place] [address] Phone: [phone] Fax: [phone]     Social Determinants of Health (SDOH) Interventions    Readmission Risk Interventions Readmission Risk Prevention Plan 10/29/2020 10/04/2020  Transportation Screening Complete Complete  Medication Review ([place]) Complete Complete  PCP or Specialist appointment within 3-5 days of discharge - Complete  HRI or [place] Complete Complete  SW Recovery Care/Counseling Consult Complete Complete  Palliative Care Screening Not Applicable Complete  [place] Not Applicable Not Applicable  Some recent data might be hidden

## 2020-10-29 NOTE — Plan of Care (Signed)

## 2020-10-29 NOTE — Progress Notes (Signed)
Pt removed IV himself with no RN present.  RN went over discharge summary with pt. IV site assessed by Mickel Baas, RN and April, RN and bandage applied. Pt's belongings with patient.   Transportation arranged by social work. Transportation waiver was signed.

## 2020-10-29 NOTE — TOC Transition Note (Addendum)
Transition of Care [facility]) - CM/SW Discharge Note   Patient Details  Name: Kenneth [name] MRN: [id number] Date of Birth: February 17, 1956  Transition of Care [facility]) CM/SW Contact:  Zenon Mayo, RN Phone Number: 10/29/2020, 9:45 AM   Clinical Narrative:    Patient is for dc today, he states he will need assistance with transportation home. NCM will assist him thru [employer].  NCM received consult for assist with medications for patient,  NCM called [facility] to see how much meds are and to see if he has coverage. The pharmacist  States he does not have medication coverage but uses a discount card.  NCM spoke with [name] , patient daughter to see if she can help patient with medications. She states she will assist him with getting his medications. NCM informed her that patient will need to enroll in Part D when the enrollment period comes back or maybe she call to see if there is any way he can still enroll at this time since enrollment period has past. Patient has Medicare insurance but does not have Part D. NCM informed   He is not eligible for the Match Program. Patient is also with [facility], NCM notified [place] with [facility] of this information also .     Final next level of care: Home/Self Care Barriers to Discharge: No Barriers Identified   Patient Goals and CMS Choice Patient states their goals for this hospitalization and ongoing recovery are:: take better care of himself   Choice offered to / list presented to : NA  Discharge Placement                       Discharge Plan and Services In-house Referral: NA Discharge Planning Services: CM Consult Post Acute Care Choice: NA            DME Agency: NA       HH Arranged: NA          Social Determinants of Health (SDOH) Interventions     Readmission Risk Interventions Readmission Risk Prevention Plan 10/29/2020 10/04/2020  Transportation Screening Complete Complete  Medication Review [employer]) Complete  Complete  PCP or Specialist appointment within 3-5 days of discharge - Complete  HRI or [place] Complete Complete  SW Recovery Care/Counseling Consult Complete Complete  Palliative Care Screening Not Applicable Complete  [place] Not Applicable Not Applicable  Some recent data might be hidden

## 2020-10-29 NOTE — Discharge Summary (Signed)
Kenneth [name] ZDG:[id number] DOB: November 05, 1955 DOA: 10/26/2020  PCP: [name], MD  Admit date: 10/26/2020  Discharge date: 10/30/2020  Admitted From: home   Disposition:  home   Recommendations for Outpatient Follow-up:   Follow up with Cardiology in 1 week Follow up with PCP in 1-2 weeks  Home Health: NA   Equipment/Devices: NA  Consultations: CARDIOLOGY Discharge Condition: Improved   CODE STATUS: Full    Diet Recommendation: Heart Healthy Low Sodium  Diet Order            Diet - low sodium heart healthy                  Chief Complaint  Patient presents with  . Chest Pain     Brief history of present illness from the day of admission and additional interim summary    Patient is a 65 year old male with past medical history of severe multivessel CAD-not felt to be a good candidate for PCI/CABG and medically managed, chronic combined systolic/diastolic heart failure, paroxysmal atrial fibrillation, end-stage renal disease on hemodialysis TTS with recent hospitalization from 12/21-12/25 for chest pain who presented to the emergency room with complaints of severe left-sided chest pain and a reported episode of hematemesis.  In the emergency room, hemoglobin stable with mild elevations in troponin and labs consistent with end-stage renal disease.  Admitted to the hospitalist service.                                                                  Hospital Course   Patient's chest pain resolved after admission.  He was seen by Cardiology service who noted severe CAD but noted that he was not a candidate for intervention.  They also noted symptoms were atypical possibly GI related. Antianginals were optimized with increased Imdur and Toprol xl.  In addition, Protonix was added.   Chest pain with known  CAD Patient was ruled out for acute coronary syndrome Patient seen by cardiology who noted no further intervention was available for him. Continue DAPT with Plavix and aspirin Continue amlodipine Toprol-XL was increased to 100 twice daily There was increased to 90 mg daily  PAF Well-controlled on Toprol-XL--when he does take his medicines Not on anticoagulation given no noncompliance  ESRD HD per renal service  HTN Reasonably controlled in house however there is some concern for possible hypotension given increased doses of Toprol-XL especially surrounding dialysis. Further management of BP per Nephrology service pre-/post dialysis.   Discharge diagnosis     Principal Problem:   Chest pain Active Problems:   Essential hypertension   Alcoholism (HCC)   Acute on chronic diastolic CHF (congestive heart failure) (HCC)   ESRD (end stage renal disease) (HCC)   CAD (coronary artery disease)   PAF (paroxysmal atrial fibrillation) (  [facility])    Discharge instructions    Discharge Instructions    AMB Referral to [place] Management   Complete by: As directed    Restart follow up: less than 7 days readmission  Please assign to [place] for complex care [patient with AF not on Anticoags, HD on TTS schedule - try to avoid call on HD scheduled days] and disease management follow up calls and assess for further needs.  Questions please call:   [name], RN BSN [facility] Liaison  [phone] business mobile phone Toll free office [phone]  Fax number: [phone] [email or web address]   Reason for consult: Readmission less than 7 days   Diagnoses of:  Heart Failure Other     Other Diagnosis: Atrial Fib not on anticoags   Expected date of contact: 1-3 days (reserved for hospital discharges)   Call MD for:  persistant dizziness or light-headedness   Complete by: As directed    Diet -  low sodium heart healthy   Complete by: As directed    Discharge instructions   Complete by: As directed    1.  You will need to see your cardiologist/heart doctor in 1 week. 2.  Your Toprol-XL has been increased to 1 tablet twice a day. 3.  Your Imdur has been increased to 3 tablets a day. 4.  You are on a new medication called Protonix which you should take once a day on an empty stomach.  This is for acid reflux and may have caused your chest pain. 5.  You can also take a new medicine called Pepcid as needed for heartburn.  You can take up to 2 pills a day. 6.  See your PCP in 1 to 2 weeks after discharge.   Increase activity slowly   Complete by: As directed    No wound care   Complete by: As directed       Discharge Medications   Allergies as of 10/29/2020   No Known Allergies     Medication List    TAKE these medications   amLODipine 10 MG tablet Commonly known as: NORVASC Take 0.5 tablets (5 mg total) by mouth daily.   aspirin 81 MG EC tablet Take 1 tablet (81 mg total) by mouth daily. Swallow whole.   atorvastatin 80 MG tablet Commonly known as: LIPITOR Take 1 tablet (80 mg total) by mouth daily.   clopidogrel 75 MG tablet Commonly known as: PLAVIX Take 1 tablet (75 mg total) by mouth daily. What changed: when to take this   famotidine 20 MG tablet Commonly known as: PEPCID Take 1 tablet (20 mg total) by mouth 2 (two) times daily as needed for heartburn.   isosorbide mononitrate 30 MG 24 hr tablet Commonly known as: IMDUR Take 3 tablets (90 mg total) by mouth daily. What changed: how much to take   lanthanum 1000 MG chewable tablet Commonly known as: FOSRENOL Chew 1,000 mg by mouth 3 (three) times daily.   metoprolol succinate 100 MG 24 hr tablet Commonly known as: TOPROL-XL Take 1 tablet (100 mg total) by mouth 2 (two) times daily. Take with or immediately following a meal. What changed:   medication strength  how much to take  how to take  this  when to take this  additional instructions   nitroGLYCERIN 0.4 MG SL tablet Commonly known as: NITROSTAT Place 1 tablet (0.4 mg total) under the tongue every 5 (five) minutes as needed for chest pain (  max 3 doses).   pantoprazole 40 MG tablet Commonly known as: PROTONIX Take 1 tablet (40 mg total) by mouth daily at 6 (six) AM.   Rena-Vite Rx 1 MG Tabs Take 1 tablet by mouth daily.   Tylenol PM Extra Strength 25-500 MG Tabs tablet Generic drug: diphenhydramine-acetaminophen Take 2 tablets by mouth at bedtime as needed (pain).         Major procedures and Radiology Reports - PLEASE review detailed and final reports thoroughly  -        DG Chest 2 View  Result Date: 10/26/2020 CLINICAL DATA:  Chest pain EXAM: CHEST - 2 VIEW COMPARISON:  10/16/2020 FINDINGS: No focal opacity or pleural effusion. Normal cardiomediastinal silhouette. No pneumothorax. Aortic atherosclerosis. IMPRESSION: No active cardiopulmonary disease. Electronically Signed   By: Donavan Foil M.D.   On: 10/26/2020 22:44   DG Chest 2 View  Result Date: 10/16/2020 CLINICAL DATA:  Short of breath for 1 day, history of CHF EXAM: CHEST - 2 VIEW COMPARISON:  10/16/2020 at 2 a.m. FINDINGS: Frontal and lateral views of the chest demonstrate a stable cardiac silhouette allowing for differences in positioning and technique. Since exam performed earlier today, there is increased central vascular congestion with new development of interstitial opacities and bilateral ground-glass airspace disease. No large effusion. No pneumothorax. No acute bony abnormalities. IMPRESSION: 1. Findings most consistent with pulmonary edema, which has developed in the interim since prior exam. Electronically Signed   By: Randa Ngo M.D.   On: 10/16/2020 20:38   DG Chest 2 View  Result Date: 10/16/2020 CLINICAL DATA:  Shortness of breath and wheezing. EXAM: CHEST - 2 VIEW COMPARISON:  October 02, 2020 FINDINGS: Mild diffuse chronic  appearing increased interstitial lung markings are seen. There is no evidence of acute infiltrate, pleural effusion or pneumothorax. The heart size and mediastinal contours are within normal limits. There is moderate severity calcification of the aortic arch. The visualized skeletal structures are unremarkable. IMPRESSION: No acute or active cardiopulmonary disease. Electronically Signed   By: Virgina Norfolk M.D.   On: 10/16/2020 02:38   DG Chest 2 View  Result Date: 10/02/2020 CLINICAL DATA:  Chest pain EXAM: CHEST - 2 VIEW COMPARISON:  September 29, 2020 FINDINGS: The heart size and mediastinal contours are within normal limits. Aortic knob calcifications are seen. Both lungs are clear. The visualized skeletal structures are unremarkable. IMPRESSION: No active cardiopulmonary disease. Electronically Signed   By: Prudencio Pair M.D.   On: 10/02/2020 01:17   CT HEAD WO CONTRAST  Result Date: 10/16/2020 CLINICAL DATA:  Mental status change.  Unknown cause. EXAM: CT HEAD WITHOUT CONTRAST TECHNIQUE: Contiguous axial images were obtained from the base of the skull through the vertex without intravenous contrast. COMPARISON:  CT head 03/15/2018 FINDINGS: Brain: Cerebral ventricle sizes are concordant with the degree of cerebral volume loss. Patchy and confluent areas of decreased attenuation are noted throughout the deep and periventricular white matter of the cerebral hemispheres bilaterally, compatible with chronic microvascular ischemic disease. No suspicious lytic or blastic osseous lesions. No acute displaced fracture. Multilevel degenerative changes of the spine. No evidence of large-territorial acute infarction. No parenchymal hemorrhage. No mass lesion. No extra-axial collection. No mass effect or midline shift. No hydrocephalus. Basilar cisterns are patent. Vascular: No hyperdense vessel. Atherosclerotic calcifications are present within the cavernous internal carotid and bilateral vertebral arteries.  Skull: No acute fracture or focal lesion. Sinuses/Orbits: Paranasal sinuses and mastoid air cells are clear. The orbits are unremarkable. Other: None.  IMPRESSION: No acute intracranial abnormality. Electronically Signed   By: Iven Finn M.D.   On: 10/16/2020 20:24    Micro Results    Recent Results (from the past 240 hour(s))  Resp Panel by RT-PCR (Flu A&B, Covid) Nasopharyngeal Swab     Status: None   Collection Time: 10/27/20  4:23 AM   Specimen: Nasopharyngeal Swab; Nasopharyngeal(NP) swabs in vial transport medium  Result Value Ref Range Status   SARS Coronavirus 2 by RT PCR NEGATIVE NEGATIVE Final    Comment: (NOTE) SARS-CoV-2 target nucleic acids are NOT DETECTED.  The SARS-CoV-2 RNA is generally detectable in upper respiratory specimens during the acute phase of infection. The lowest concentration of SARS-CoV-2 viral copies this assay can detect is 138 copies/mL. A negative result does not preclude SARS-Cov-2 infection and should not be used as the sole basis for treatment or other patient management decisions. A negative result may occur with  improper specimen collection/handling, submission of specimen other than nasopharyngeal swab, presence of viral mutation(s) within the areas targeted by this assay, and inadequate number of viral copies(<138 copies/mL). A negative result must be combined with clinical observations, patient history, and epidemiological information. The expected result is Negative.  Fact Sheet for Patients:  EntrepreneurPulse.com.au  Fact Sheet for Healthcare Providers:  IncredibleEmployment.be  This test is no t yet approved or cleared by the [place] FDA and  has been authorized for detection and/or diagnosis of SARS-CoV-2 by FDA under an Emergency Use Authorization (EUA). This EUA will remain  in effect (meaning this test can be used) for the duration of the COVID-19 declaration under Section 564(b)(1)  of the Act, 21 U.S.C.section 360bbb-3(b)(1), unless the authorization is terminated  or revoked sooner.       Influenza A by PCR NEGATIVE NEGATIVE Final   Influenza B by PCR NEGATIVE NEGATIVE Final    Comment: (NOTE) The Xpert Xpress SARS-CoV-2/FLU/RSV plus assay is intended as an aid in the diagnosis of influenza from Nasopharyngeal swab specimens and should not be used as a sole basis for treatment. Nasal washings and aspirates are unacceptable for Xpert Xpress SARS-CoV-2/FLU/RSV testing.  Fact Sheet for Patients: EntrepreneurPulse.com.au  Fact Sheet for Healthcare Providers: IncredibleEmployment.be  This test is not yet approved or cleared by the [place] FDA and has been authorized for detection and/or diagnosis of SARS-CoV-2 by FDA under an Emergency Use Authorization (EUA). This EUA will remain in effect (meaning this test can be used) for the duration of the COVID-19 declaration under Section 564(b)(1) of the Act, 21 U.S.C. section 360bbb-3(b)(1), unless the authorization is terminated or revoked.  Performed at [facility], [place] [address] [place], [place] [address]   MRSA PCR Screening     Status: None   Collection Time: 10/27/20  8:49 AM   Specimen: Nasal Mucosa; Nasopharyngeal  Result Value Ref Range Status   MRSA by PCR NEGATIVE NEGATIVE Final    Comment:        The GeneXpert MRSA Assay (FDA approved for NASAL specimens only), is one component of a comprehensive MRSA colonization surveillance program. It is not intended to diagnose MRSA infection nor to guide or monitor treatment for MRSA infections. Performed at [facility], [place] [address], [place], [place] [address]     Today   Subjective    Kenneth [name] feels much improved since admission.  Feels ready to go home.  Denies chest pain, shortness of breath or abdominal pain.  Feels they can take care of  themselves with the resources they have at  home.  Objective   Blood pressure (!) 133/56, pulse 67, temperature 98.7 F (37.1 C), temperature source Oral, resp. rate 14, height 5\' 10"  (1.778 m), weight 84 kg, SpO2 96 %.  No intake or output data in the 24 hours ending 10/30/20 1748  Exam General: Patient appears well and in good spirits sitting up in bed in no acute distress.  Eyes: sclera anicteric, conjuctiva mild injection bilaterally CVS: S1-S2, regular  Respiratory:  decreased air entry bilaterally secondary to decreased inspiratory effort, rales at bases  GI: NABS, soft, NT  LE: No edema.  Neuro: A/O x 3, Moving all extremities equally with normal strength, CN 3-12 intact, grossly nonfocal.  Psych: patient is logical and coherent, judgement and insight appear normal, mood and affect appropriate to situation.    Data Review   CBC w Diff:  Lab Results  Component Value Date   WBC 8.5 10/27/2020   HGB 11.2 (L) 10/27/2020   HCT 31.8 (L) 10/27/2020   HCT 29.7 (L) 03/17/2018   PLT 235 10/27/2020   LYMPHOPCT 18 10/16/2020   MONOPCT 7 10/16/2020   EOSPCT 8 10/16/2020   BASOPCT 1 10/16/2020    CMP:  Lab Results  Component Value Date   NA 133 (L) 10/27/2020   K 3.7 10/27/2020   CL 92 (L) 10/27/2020   CO2 29 10/27/2020   BUN 19 10/27/2020   CREATININE 5.11 (H) 10/27/2020   PROT 7.8 10/26/2020   PROT 8.4 11/19/2018   ALBUMIN 3.3 (L) 10/27/2020   ALBUMIN 4.4 11/19/2018   BILITOT 0.5 10/26/2020   BILITOT 0.5 11/19/2018   ALKPHOS 66 10/26/2020   AST 14 (L) 10/26/2020   ALT 10 10/26/2020  .   Total Time in preparing paper work, data evaluation and todays exam - 35 minutes  Vashti Hey M.D on 10/30/2020 at 5:48 PM  [facility]   Office  [phone]

## 2020-10-30 ENCOUNTER — Other Ambulatory Visit: Payer: Self-pay | Admitting: *Deleted

## 2020-10-30 DIAGNOSIS — D631 Anemia in chronic kidney disease: Secondary | ICD-10-CM | POA: Diagnosis not present

## 2020-10-30 DIAGNOSIS — N186 End stage renal disease: Secondary | ICD-10-CM | POA: Diagnosis not present

## 2020-10-30 DIAGNOSIS — N2581 Secondary hyperparathyroidism of renal origin: Secondary | ICD-10-CM | POA: Diagnosis not present

## 2020-10-30 DIAGNOSIS — Z992 Dependence on renal dialysis: Secondary | ICD-10-CM | POA: Diagnosis not present

## 2020-10-30 NOTE — Patient Outreach (Signed)
[facility]) Care Management  [place]  10/30/2020   SMARAN GAUS 10-27-1956 [id number]   Telephone Screen  Referral Date:  10/30/20 Referral Source:  Hospital Liaison Reason for Referral:  Frequent Hospitalizations and Medication Assistance Insurance:  Medicare   Outreach Attempt: Outreach attempt #1 to patient for telephone screen post hospital discharge.  Daughter, Jacob Moores answered and stated patient was in hemodialysis.  Daughter verified HIPAA.  Does report patient is having trouble affording medications due to not having prescription drug coverage with Medicare.  States patient does not have Pepcid, Protonix, nor Isosorbide prescriptions and was told by Care Manager at hospital the price would be around $87 dollars at his pharmacy to retrieve.  Daughter interested in speaking with Embedded Pharmacist at primary care office to review options.  Encouraged daughter to contact Medicare to verify if patient can or cannot enroll in prescription coverage at this date.  Daughter stated she would verify.  Agrees for follow up call tomorrow when patient available to discuss [facility] services and participation in program.  Plan:  [place] will make another telephone outreach to patient and daughter on 10/31/2020.  RN Care Coordinator will place referral to pharmacy team.   Hubert Azure RN [place] Management  RN Telephonic Care Coordinator  [phone] [email or web address]

## 2020-10-30 NOTE — Patient Outreach (Signed)
[place] [facility]) Care Management  10/30/2020  Kenneth [name] 24-Apr-1956 [id number]  Referral from Hubert Azure, RN for medication assistance sent to [facility] Management Assistant [phone]

## 2020-10-30 NOTE — Progress Notes (Signed)
Brief progress note  Patient was supposed to be discharged yesterday however patient remains in house.  Patient was seen by cardiology again this morning who noted no further intervention is warranted.  Medications as per discharge order.  Patient is to be discharged today.

## 2020-10-31 ENCOUNTER — Telehealth: Payer: Self-pay | Admitting: Family Medicine

## 2020-10-31 ENCOUNTER — Other Ambulatory Visit: Payer: Self-pay | Admitting: *Deleted

## 2020-10-31 NOTE — Patient Outreach (Signed)
[place] [facility]) Care Management  [place]  10/31/2020   Kenneth [name] 1956/02/04 [id number]    Telephone Screen  Referral Date:  10/30/20 Referral Source:  Hospital Liaison Reason for Referral:  Frequent Hospitalizations and Medication Assistance Insurance:  Medicare   Outreach Attempt:  Outreach attempt #2 to patient for screening. No answer. RN Care Coordinator left voicemail message along with contact information.  Plan:  RN Care Coordinator will send unsuccessful outreach letter to patient.  RN Care Coordinator will make another outreach attempt to patient within 3-4 business days if no return call back from patient.   [place] Management  RN Telephonic Care Coordinator [phone] [email or web address]

## 2020-10-31 NOTE — Telephone Encounter (Signed)
Transition Care Management Unsuccessful Follow-up Telephone Call  Date of discharge and from where:  10/29/2020 from [facility]   Attempts:  1st Attempt  Reason for unsuccessful TCM follow-up call:  Left voice message

## 2020-11-01 DIAGNOSIS — N2581 Secondary hyperparathyroidism of renal origin: Secondary | ICD-10-CM | POA: Diagnosis not present

## 2020-11-01 DIAGNOSIS — D631 Anemia in chronic kidney disease: Secondary | ICD-10-CM | POA: Diagnosis not present

## 2020-11-01 DIAGNOSIS — N186 End stage renal disease: Secondary | ICD-10-CM | POA: Diagnosis not present

## 2020-11-01 DIAGNOSIS — Z992 Dependence on renal dialysis: Secondary | ICD-10-CM | POA: Diagnosis not present

## 2020-11-03 DIAGNOSIS — N186 End stage renal disease: Secondary | ICD-10-CM | POA: Diagnosis not present

## 2020-11-03 DIAGNOSIS — Z992 Dependence on renal dialysis: Secondary | ICD-10-CM | POA: Diagnosis not present

## 2020-11-03 DIAGNOSIS — D631 Anemia in chronic kidney disease: Secondary | ICD-10-CM | POA: Diagnosis not present

## 2020-11-03 DIAGNOSIS — N2581 Secondary hyperparathyroidism of renal origin: Secondary | ICD-10-CM | POA: Diagnosis not present

## 2020-11-05 ENCOUNTER — Other Ambulatory Visit: Payer: Self-pay | Admitting: *Deleted

## 2020-11-05 NOTE — Patient Outreach (Signed)
[place] [facility]) Care Management  11/05/2020  Kenneth [name] 1956-03-18 [id number]   Outreach attempt #2, unsuccessful, unable to leave voice message as mailbox is full.  Will follow up within the next 3-4 business days.  Valente David, [place], MSN [place] [phone]

## 2020-11-06 DIAGNOSIS — N186 End stage renal disease: Secondary | ICD-10-CM | POA: Diagnosis not present

## 2020-11-06 DIAGNOSIS — D631 Anemia in chronic kidney disease: Secondary | ICD-10-CM | POA: Diagnosis not present

## 2020-11-06 DIAGNOSIS — N2581 Secondary hyperparathyroidism of renal origin: Secondary | ICD-10-CM | POA: Diagnosis not present

## 2020-11-06 DIAGNOSIS — Z992 Dependence on renal dialysis: Secondary | ICD-10-CM | POA: Diagnosis not present

## 2020-11-08 ENCOUNTER — Other Ambulatory Visit: Payer: Self-pay

## 2020-11-08 DIAGNOSIS — D631 Anemia in chronic kidney disease: Secondary | ICD-10-CM | POA: Diagnosis not present

## 2020-11-08 DIAGNOSIS — Z992 Dependence on renal dialysis: Secondary | ICD-10-CM | POA: Diagnosis not present

## 2020-11-08 DIAGNOSIS — N186 End stage renal disease: Secondary | ICD-10-CM | POA: Diagnosis not present

## 2020-11-08 DIAGNOSIS — N2581 Secondary hyperparathyroidism of renal origin: Secondary | ICD-10-CM | POA: Diagnosis not present

## 2020-11-09 ENCOUNTER — Other Ambulatory Visit: Payer: Self-pay | Admitting: *Deleted

## 2020-11-09 NOTE — Patient Outreach (Signed)
[place] [facility]) Care Management  11/09/2020  Kenneth [name] [birth year] [id number]   Outreach attempt #3, unsuccessful.  Male answered phone stating member is not available, this care manager requested a call back.  Will await call back, if no call back will follow up within the next 3 weeks.  If remain unsuccessful, will close case due to inability to establish contact.  Valente David, [place], MSN [place] [phone]

## 2020-11-10 DIAGNOSIS — D631 Anemia in chronic kidney disease: Secondary | ICD-10-CM | POA: Diagnosis not present

## 2020-11-10 DIAGNOSIS — N2581 Secondary hyperparathyroidism of renal origin: Secondary | ICD-10-CM | POA: Diagnosis not present

## 2020-11-10 DIAGNOSIS — N186 End stage renal disease: Secondary | ICD-10-CM | POA: Diagnosis not present

## 2020-11-10 DIAGNOSIS — Z992 Dependence on renal dialysis: Secondary | ICD-10-CM | POA: Diagnosis not present

## 2020-11-13 DIAGNOSIS — Z992 Dependence on renal dialysis: Secondary | ICD-10-CM | POA: Diagnosis not present

## 2020-11-13 DIAGNOSIS — D631 Anemia in chronic kidney disease: Secondary | ICD-10-CM | POA: Diagnosis not present

## 2020-11-13 DIAGNOSIS — N2581 Secondary hyperparathyroidism of renal origin: Secondary | ICD-10-CM | POA: Diagnosis not present

## 2020-11-13 DIAGNOSIS — N186 End stage renal disease: Secondary | ICD-10-CM | POA: Diagnosis not present

## 2020-11-15 DIAGNOSIS — N2581 Secondary hyperparathyroidism of renal origin: Secondary | ICD-10-CM | POA: Diagnosis not present

## 2020-11-15 DIAGNOSIS — Z992 Dependence on renal dialysis: Secondary | ICD-10-CM | POA: Diagnosis not present

## 2020-11-15 DIAGNOSIS — D631 Anemia in chronic kidney disease: Secondary | ICD-10-CM | POA: Diagnosis not present

## 2020-11-15 DIAGNOSIS — N186 End stage renal disease: Secondary | ICD-10-CM | POA: Diagnosis not present

## 2020-11-17 DIAGNOSIS — N186 End stage renal disease: Secondary | ICD-10-CM | POA: Diagnosis not present

## 2020-11-17 DIAGNOSIS — N2581 Secondary hyperparathyroidism of renal origin: Secondary | ICD-10-CM | POA: Diagnosis not present

## 2020-11-17 DIAGNOSIS — Z992 Dependence on renal dialysis: Secondary | ICD-10-CM | POA: Diagnosis not present

## 2020-11-17 DIAGNOSIS — D631 Anemia in chronic kidney disease: Secondary | ICD-10-CM | POA: Diagnosis not present

## 2020-11-19 ENCOUNTER — Emergency Department (HOSPITAL_COMMUNITY): Payer: Medicare Other

## 2020-11-19 ENCOUNTER — Inpatient Hospital Stay (HOSPITAL_COMMUNITY)
Admission: EM | Admit: 2020-11-19 | Discharge: 2020-11-22 | DRG: 291 | Disposition: A | Discharge status: Home / Self Care | Payer: Medicare Other | Attending: Family Medicine | Admitting: Family Medicine

## 2020-11-19 ENCOUNTER — Other Ambulatory Visit: Payer: Self-pay

## 2020-11-19 DIAGNOSIS — I480 Paroxysmal atrial fibrillation: Secondary | ICD-10-CM | POA: Diagnosis present

## 2020-11-19 DIAGNOSIS — Z992 Dependence on renal dialysis: Principal | ICD-10-CM

## 2020-11-19 DIAGNOSIS — I472 Ventricular tachycardia: Secondary | ICD-10-CM | POA: Diagnosis present

## 2020-11-19 DIAGNOSIS — E875 Hyperkalemia: Secondary | ICD-10-CM | POA: Diagnosis present

## 2020-11-19 DIAGNOSIS — D649 Anemia, unspecified: Secondary | ICD-10-CM | POA: Diagnosis present

## 2020-11-19 DIAGNOSIS — Z8674 Personal history of sudden cardiac arrest: Secondary | ICD-10-CM

## 2020-11-19 DIAGNOSIS — R7989 Other specified abnormal findings of blood chemistry: Secondary | ICD-10-CM | POA: Diagnosis present

## 2020-11-19 DIAGNOSIS — I252 Old myocardial infarction: Secondary | ICD-10-CM

## 2020-11-19 DIAGNOSIS — I340 Nonrheumatic mitral (valve) insufficiency: Secondary | ICD-10-CM | POA: Diagnosis present

## 2020-11-19 DIAGNOSIS — Z87891 Personal history of nicotine dependence: Secondary | ICD-10-CM

## 2020-11-19 DIAGNOSIS — N186 End stage renal disease: Secondary | ICD-10-CM

## 2020-11-19 DIAGNOSIS — Z9114 Patient's other noncompliance with medication regimen: Secondary | ICD-10-CM

## 2020-11-19 DIAGNOSIS — R0602 Shortness of breath: Secondary | ICD-10-CM | POA: Diagnosis not present

## 2020-11-19 DIAGNOSIS — J449 Chronic obstructive pulmonary disease, unspecified: Secondary | ICD-10-CM | POA: Diagnosis not present

## 2020-11-19 DIAGNOSIS — E8889 Other specified metabolic disorders: Secondary | ICD-10-CM | POA: Diagnosis present

## 2020-11-19 DIAGNOSIS — Z7982 Long term (current) use of aspirin: Secondary | ICD-10-CM

## 2020-11-19 DIAGNOSIS — I2510 Atherosclerotic heart disease of native coronary artery without angina pectoris: Secondary | ICD-10-CM | POA: Diagnosis not present

## 2020-11-19 DIAGNOSIS — I5023 Acute on chronic systolic (congestive) heart failure: Secondary | ICD-10-CM | POA: Diagnosis not present

## 2020-11-19 DIAGNOSIS — Z7902 Long term (current) use of antithrombotics/antiplatelets: Secondary | ICD-10-CM

## 2020-11-19 DIAGNOSIS — Z825 Family history of asthma and other chronic lower respiratory diseases: Secondary | ICD-10-CM

## 2020-11-19 DIAGNOSIS — I509 Heart failure, unspecified: Secondary | ICD-10-CM

## 2020-11-19 DIAGNOSIS — Z20822 Contact with and (suspected) exposure to covid-19: Secondary | ICD-10-CM | POA: Diagnosis not present

## 2020-11-19 DIAGNOSIS — Z79899 Other long term (current) drug therapy: Secondary | ICD-10-CM

## 2020-11-19 DIAGNOSIS — E8770 Fluid overload, unspecified: Secondary | ICD-10-CM | POA: Diagnosis not present

## 2020-11-19 DIAGNOSIS — I132 Hypertensive heart and chronic kidney disease with heart failure and with stage 5 chronic kidney disease, or end stage renal disease: Principal | ICD-10-CM | POA: Diagnosis present

## 2020-11-19 DIAGNOSIS — G8929 Other chronic pain: Secondary | ICD-10-CM | POA: Diagnosis present

## 2020-11-19 DIAGNOSIS — I5020 Unspecified systolic (congestive) heart failure: Secondary | ICD-10-CM | POA: Diagnosis not present

## 2020-11-19 DIAGNOSIS — I5042 Chronic combined systolic (congestive) and diastolic (congestive) heart failure: Secondary | ICD-10-CM | POA: Diagnosis present

## 2020-11-19 DIAGNOSIS — I10 Essential (primary) hypertension: Secondary | ICD-10-CM | POA: Diagnosis present

## 2020-11-19 DIAGNOSIS — J9601 Acute respiratory failure with hypoxia: Secondary | ICD-10-CM

## 2020-11-19 DIAGNOSIS — I160 Hypertensive urgency: Secondary | ICD-10-CM | POA: Diagnosis present

## 2020-11-19 DIAGNOSIS — N250 Renal osteodystrophy: Secondary | ICD-10-CM | POA: Diagnosis not present

## 2020-11-19 DIAGNOSIS — N2581 Secondary hyperparathyroidism of renal origin: Secondary | ICD-10-CM | POA: Diagnosis not present

## 2020-11-19 DIAGNOSIS — J90 Pleural effusion, not elsewhere classified: Secondary | ICD-10-CM | POA: Diagnosis not present

## 2020-11-19 DIAGNOSIS — I429 Cardiomyopathy, unspecified: Secondary | ICD-10-CM | POA: Diagnosis present

## 2020-11-19 LAB — CBC WITH DIFFERENTIAL/PLATELET
Abs Immature Granulocytes: 0.04 10*3/uL (ref 0.00–0.07)
Basophils Absolute: 0.1 10*3/uL (ref 0.0–0.1)
Basophils Relative: 1 %
Eosinophils Absolute: 0.5 10*3/uL (ref 0.0–0.5)
Eosinophils Relative: 5 %
HCT: 35.4 % — ABNORMAL LOW (ref 39.0–52.0)
Hemoglobin: 11.5 g/dL — ABNORMAL LOW (ref 13.0–17.0)
Immature Granulocytes: 0 %
Lymphocytes Relative: 16 %
Lymphs Abs: 1.5 10*3/uL (ref 0.7–4.0)
MCH: 31.4 pg (ref 26.0–34.0)
MCHC: 32.5 g/dL (ref 30.0–36.0)
MCV: 96.7 fL (ref 80.0–100.0)
Monocytes Absolute: 1 10*3/uL (ref 0.1–1.0)
Monocytes Relative: 11 %
Neutro Abs: 6.3 10*3/uL (ref 1.7–7.7)
Neutrophils Relative %: 67 %
Platelets: 282 10*3/uL (ref 150–400)
RBC: 3.66 MIL/uL — ABNORMAL LOW (ref 4.22–5.81)
RDW: 17.5 % — ABNORMAL HIGH (ref 11.5–15.5)
WBC: 9.4 10*3/uL (ref 4.0–10.5)
nRBC: 0 % (ref 0.0–0.2)

## 2020-11-19 LAB — COMPREHENSIVE METABOLIC PANEL
ALT: 10 U/L (ref 0–44)
AST: 14 U/L — ABNORMAL LOW (ref 15–41)
Albumin: 3.1 g/dL — ABNORMAL LOW (ref 3.5–5.0)
Alkaline Phosphatase: 60 U/L (ref 38–126)
Anion gap: 18 — ABNORMAL HIGH (ref 5–15)
BUN: 65 mg/dL — ABNORMAL HIGH (ref 8–23)
CO2: 23 mmol/L (ref 22–32)
Calcium: 9.1 mg/dL (ref 8.9–10.3)
Chloride: 99 mmol/L (ref 98–111)
Creatinine, Ser: 12.24 mg/dL — ABNORMAL HIGH (ref 0.61–1.24)
GFR, Estimated: 4 mL/min — ABNORMAL LOW (ref >60)
Glucose, Bld: 81 mg/dL (ref 70–99)
Potassium: 5.4 mmol/L — ABNORMAL HIGH (ref 3.5–5.1)
Sodium: 140 mmol/L (ref 135–145)
Total Bilirubin: 0.8 mg/dL (ref 0.3–1.2)
Total Protein: 8.1 g/dL (ref 6.5–8.1)

## 2020-11-19 LAB — TRIGLYCERIDES: Triglycerides: 90 mg/dL (ref <150)

## 2020-11-19 LAB — PROCALCITONIN: Procalcitonin: <0.1 ng/mL

## 2020-11-19 LAB — D-DIMER, QUANTITATIVE: D-Dimer, Quant: 1.39 ug/mL-FEU — ABNORMAL HIGH (ref 0.00–0.50)

## 2020-11-19 LAB — LACTIC ACID, PLASMA: Lactic Acid, Venous: 1 mmol/L (ref 0.5–1.9)

## 2020-11-19 LAB — FIBRINOGEN: Fibrinogen: 437 mg/dL (ref 210–475)

## 2020-11-19 LAB — C-REACTIVE PROTEIN: CRP: 0.5 mg/dL (ref <1.0)

## 2020-11-19 LAB — LACTATE DEHYDROGENASE: LDH: 202 U/L — ABNORMAL HIGH (ref 98–192)

## 2020-11-19 LAB — SARS CORONAVIRUS 2 BY RT PCR (HOSPITAL ORDER, PERFORMED IN ~~LOC~~ HOSPITAL LAB): SARS Coronavirus 2: NEGATIVE

## 2020-11-19 LAB — FERRITIN: Ferritin: 979 ng/mL — ABNORMAL HIGH (ref 24–336)

## 2020-11-19 MED ORDER — HEPARIN SODIUM (PORCINE) 1000 UNIT/ML DIALYSIS
2400.0000 [IU] | INTRAMUSCULAR | Status: DC | PRN
  Administered 2020-11-20: 2400 [IU] via INTRAVENOUS_CENTRAL
  Filled 2020-11-19 (×2): qty 3

## 2020-11-19 MED ORDER — SODIUM CHLORIDE 0.9 % IV SOLN
1000.0000 mL | INTRAVENOUS | Status: DC
  Administered 2020-11-19: 1000 mL via INTRAVENOUS

## 2020-11-19 MED ORDER — PENTAFLUOROPROP-TETRAFLUOROETH EX AERO: 1.0000 "application " | INHALATION_SPRAY | CUTANEOUS | Status: DC | PRN

## 2020-11-19 MED ORDER — CINACALCET HCL 30 MG PO TABS
30.0000 mg | ORAL_TABLET | ORAL | Status: DC
  Administered 2020-11-22: 30 mg via ORAL
  Filled 2020-11-19 (×2): qty 1

## 2020-11-19 MED ORDER — SODIUM CHLORIDE 0.9 % IV SOLN: 100.0000 mL | INTRAVENOUS | Status: DC | PRN

## 2020-11-19 MED ORDER — SODIUM ZIRCONIUM CYCLOSILICATE 10 G PO PACK
10.0000 g | PACK | Freq: Once | ORAL | Status: AC
  Administered 2020-11-19: 10 g via ORAL
  Filled 2020-11-19: qty 1

## 2020-11-19 MED ORDER — LIDOCAINE HCL (PF) 1 % IJ SOLN: 5.0000 mL | INTRAMUSCULAR | Status: DC | PRN

## 2020-11-19 MED ORDER — CHLORHEXIDINE GLUCONATE CLOTH 2 % EX PADS: 6.0000 | MEDICATED_PAD | Freq: Every day | CUTANEOUS | Status: DC

## 2020-11-19 MED ORDER — CALCITRIOL 0.5 MCG PO CAPS
2.2500 ug | ORAL_CAPSULE | ORAL | Status: DC
  Administered 2020-11-22: 2.25 ug via ORAL
  Filled 2020-11-19 (×2): qty 1

## 2020-11-19 MED ORDER — LIDOCAINE-PRILOCAINE 2.5-2.5 % EX CREA: 1.0000 "application " | TOPICAL_CREAM | CUTANEOUS | Status: DC | PRN

## 2020-11-19 NOTE — ED Triage Notes (Signed)
Pt came from EMS due to [facility]. Pt has a hx of COPD. Pt last had dialysis yesterday 1/23. EMS states pts O2 was 91% on a NRB.

## 2020-11-19 NOTE — ED Notes (Signed)
Unable to obtain IV access. IV team consulted.

## 2020-11-19 NOTE — ED Notes (Signed)
Unable to obtain second set of blood cultures or lactic acid. Phlebotomy attempted as well.

## 2020-11-19 NOTE — ED Provider Notes (Signed)
[facility] DEPARTMENT Provider Note   CSN: [id number] Arrival date & time: 11/19/20  1856     History Chief Complaint  Patient presents with  . Shortness of Breath    MIN SARRA is a 65 y.o. male.  Pt presents to the ED today with sob.  Pt said he's been sob for the past 1-2 weeks.  The pt has a hx of COPD, but does not normally wear O2.  He also has a hx of CHF and ESRD on HD.  He's been compliant with dialysis.  He last went Saturday (1/22) and is due tomorrow  (Tues/Thurs/Sat).  He's been vaccinated against Covid (including booster), but was exposed to a granddaughter who has Covid.  She lives with him.  He denies fevers.  He's been nauseous and has been vomiting.  EMS brought pt in on a NRB.  Unclear what his initial O2 sat was on RA.  Now, on RA, it is 88%.  Pt placed on 2L via [place].        Past Medical History:  Diagnosis Date  . Acute CHF ([place]) 01/2018  . Cardiomyopathy secondary    likely related to HTN heart disease; possibly ETOH related as well  . Chronic combined systolic and diastolic heart failure (HCC)    Echocardiogram 09/22/11: Moderate LVH, EF 99991111, grade 3 diastolic dysfunction, mild MR, moderate to severe LAE, mild RVE, mild to moderate TR, small to moderate pericardial effusion  . Coronary artery disease    not felt to be candidate for CABG 08/2018; medical thearpy recomended due to high risk of PCI   . Dysrhythmia    aflutter 04/2016, afib 02/2018, not felt to be a candidate for anticoagulation due to non-compliance and ETOH  . ESRD (end stage renal disease) on dialysis ([place])    due to hypertensive nephrosclerosis; TTS; [address] (09/01/2018)  . History of alcohol abuse   . Hypertension   . Myocardial infarction [facility])    " mild " per daughter  . PEA (Pulseless electrical activity) ([place])    PEA arrest 03/09/18, treated empirically for hyperkalemia, shock x1 for WCT, given amiodarone, ROSC after 10 min of ACLS    Patient  Active Problem List   Diagnosis Date Noted  . Volume overload 11/19/2020  . Chest pain 10/27/2020  . PAF (paroxysmal atrial fibrillation) ([place]) 10/27/2020  . Troponin level elevated   . Angina at rest [facility]) 09/24/2020  . Accelerated hypertension 09/24/2020  . Dyslipidemia 09/24/2020  . NSTEMI (non-ST elevated myocardial infarction) ([place]) 05/12/2020  . Non-ST elevated myocardial infarction ([place]) 05/12/2020  . DNR (do not resuscitate) discussion   . Palliative care by specialist   . Goals of care, counseling/discussion   . CAD (coronary artery disease)   . STEMI (ST elevation myocardial infarction) ([place]) 09/17/2018  . Unstable angina ([place])   . ACS (acute coronary syndrome) ([place]) 09/01/2018  . ESRD (end stage renal disease) ([place]) 07/20/2018  . Acute respiratory failure ([place]) 05/11/2018  . Cardiac arrest ([place]) 03/09/2018  . Acute encephalopathy   . Acute on chronic respiratory failure ([place]) 02/16/2018  . Tobacco dependence 02/16/2018  . Pulmonary edema 01/26/2018  . Acute respiratory failure with hypoxia ([place]) 01/26/2018  . Atrial fibrillation and flutter ([place])   . Shortness of breath   . Acute on chronic diastolic CHF (congestive heart failure) ([place])   . Hypothermia 10/08/2016  . ESRD on hemodialysis ([place]) 10/08/2016  . Fall 10/08/2016  . Syncope 10/07/2016  .  Near syncope 07/07/2016  . Cardiomyopathy ([place]) 07/07/2016  . Ventricular tachycardia, non-sustained ([place]) 07/06/2016  . Hypoglycemia 07/06/2016  . CKD (chronic kidney disease) stage 5, GFR less than 15 ml/min (HCC)   . Alcoholism ([place]) 05/13/2016  . Hypertensive emergency 12/08/2013  . Cardiomyopathy secondary 10/09/2011  . Essential hypertension 10/09/2011  . Alcohol withdrawal delirium ([place]) 09/25/2011  . Chronic diastolic CHF (congestive heart failure) ([place]) 09/24/2011    Past Surgical History:  Procedure Laterality Date  . AV FISTULA PLACEMENT Left 05/14/2016   Procedure: LEFT ARM BASILIC VEIN TRANSPOSITION;   Surgeon: Rosetta Posner, MD;  Location: [facility];  Service: Vascular;  Laterality: Left;  . INSERTION OF DIALYSIS CATHETER N/A 06/20/2019   Procedure: INSERTION OF TUNNELED  DIALYSIS CATHETER;  Surgeon: Elam Dutch, MD;  Location: [facility];  Service: Vascular;  Laterality: N/A;  . LEFT HEART CATH AND CORONARY ANGIOGRAPHY N/A 09/02/2018   Procedure: LEFT HEART CATH AND CORONARY ANGIOGRAPHY;  Surgeon: Nelva Bush, MD;  Location: [facility] CV LAB;  Service: Cardiovascular;  Laterality: N/A;  . LEFT HEART CATH AND CORONARY ANGIOGRAPHY N/A 05/14/2020   Procedure: LEFT HEART CATH AND CORONARY ANGIOGRAPHY;  Surgeon: Sherren Mocha, MD;  Location: [facility] CV LAB;  Service: Cardiovascular;  Laterality: N/A;  . PERIPHERAL VASCULAR CATHETERIZATION N/A 05/13/2016   Procedure: Dialysis/Perma Catheter Insertion;  Surgeon: Serafina Mitchell, MD;  Location: [facility] CV [facility];  Service: Cardiovascular;  Laterality: N/A;  . REVISION OF ARTERIOVENOUS GORETEX GRAFT Left 0000000   Procedure: PLICATION OF ARTERIOVENOUS FISTULA LEFT ARM;  Surgeon: Elam Dutch, MD;  Location: [facility] OR;  Service: Vascular;  Laterality: Left;       Family History  Problem Relation Age of Onset  . Emphysema Mother   . Cirrhosis Father     Social History   Tobacco Use  . Smoking status: Former Smoker    Packs/day: 2.00    Years: 48.00    Pack years: 96.00    Types: Cigars    Quit date: 10/27/2017    Years since quitting: 3.0  . Smokeless tobacco: Former Systems developer    Types: Secondary school teacher  . Vaping Use: Never used  Substance Use Topics  . Alcohol use: Not Currently    Comment: h/o very heavy use, quit about 2019  . Drug use: No    Home Medications Prior to Admission medications   Medication Sig Start Date End Date Taking? Authorizing Provider  amLODipine (NORVASC) 10 MG tablet Take 0.5 tablets (5 mg total) by mouth daily. 10/20/20   Kathie Dike, MD  aspirin EC 81 MG EC tablet Take 1 tablet (81 mg total) by  mouth daily. Swallow whole. 05/16/20   Alma Friendly, MD  atorvastatin (LIPITOR) 80 MG tablet Take 1 tablet (80 mg total) by mouth daily. 10/09/20   Erlene Quan, PA-C  B Complex-C-Folic Acid (RENA-VITE RX) 1 MG TABS Take 1 tablet by mouth daily. 10/05/20   [provider]  clopidogrel (PLAVIX) 75 MG tablet Take 1 tablet (75 mg total) by mouth daily. Patient taking differently: Take 75 mg by mouth every evening. 10/09/20   Kilroy, Doreene Burke, PA-C  diphenhydramine-acetaminophen (TYLENOL PM EXTRA STRENGTH) 25-500 MG TABS tablet Take 2 tablets by mouth at bedtime as needed (pain). 05/12/18   [provider]  famotidine (PEPCID) 20 MG tablet Take 1 tablet (20 mg total) by mouth 2 (two) times daily as needed for heartburn. 10/28/20   Vashti Hey, MD  isosorbide mononitrate (IMDUR) 30 MG 24 hr tablet Take 3 tablets (90 mg total) by mouth daily. 10/29/20 11/28/20  Vashti Hey, MD  lanthanum (FOSRENOL) 1000 MG chewable tablet Chew 1,000 mg by mouth 3 (three) times daily. 08/23/20   [provider]  metoprolol succinate (TOPROL-XL) 100 MG 24 hr tablet Take 1 tablet (100 mg total) by mouth 2 (two) times daily. Take with or immediately following a meal. 10/28/20 11/27/20  Vashti Hey, MD  nitroGLYCERIN (NITROSTAT) 0.4 MG SL tablet Place 1 tablet (0.4 mg total) under the tongue every 5 (five) minutes as needed for chest pain (max 3 doses). 10/13/20   Truddie Hidden, MD  pantoprazole (PROTONIX) 40 MG tablet Take 1 tablet (40 mg total) by mouth daily at 6 (six) AM. 10/29/20 11/28/20  Vashti Hey, MD    Allergies    Patient has no known allergies.  Review of Systems   Review of Systems  Respiratory: Positive for shortness of breath.   Gastrointestinal: Positive for nausea and vomiting.  All other systems reviewed and are negative.   Physical Exam Updated Vital Signs BP (!) 183/77   Pulse (!) 51   Resp (!) 22   Ht '5\' 11"'$  (1.803  m)   Wt 90.7 kg   SpO2 97%   BMI 27.89 kg/m   Physical Exam Vitals and nursing note reviewed.  Constitutional:      Appearance: He is well-developed.  HENT:     Head: Normocephalic and atraumatic.     Mouth/Throat:     Mouth: Mucous membranes are moist.     Pharynx: Oropharynx is clear.  Eyes:     Extraocular Movements: Extraocular movements intact.     Pupils: Pupils are equal, round, and reactive to light.  Cardiovascular:     Rate and Rhythm: Normal rate and regular rhythm.  Pulmonary:     Effort: Tachypnea present.     Breath sounds: Rhonchi present.  Abdominal:     General: Bowel sounds are normal.     Palpations: Abdomen is soft.  Musculoskeletal:        General: Normal range of motion.     Cervical back: Normal range of motion and neck supple.     Comments: +AVF LUE.  Good thrill.  Skin:    General: Skin is warm.     Capillary Refill: Capillary refill takes less than 2 seconds.  Neurological:     General: No focal deficit present.     Mental Status: He is alert and oriented to person, place, and time.  Psychiatric:        Mood and Affect: Mood normal.        Behavior: Behavior normal.     ED Results / Procedures / Treatments   Labs (all labs ordered are listed, but only abnormal results are displayed) Labs Reviewed  CBC WITH DIFFERENTIAL/PLATELET - Abnormal; Notable for the following components:      Result Value   RBC 3.66 (*)    Hemoglobin 11.5 (*)    HCT 35.4 (*)    RDW 17.5 (*)    All other components within normal limits  COMPREHENSIVE METABOLIC PANEL - Abnormal; Notable for the following components:   Potassium 5.4 (*)    BUN 65 (*)    Creatinine, Ser 12.24 (*)    Albumin 3.1 (*)    AST 14 (*)    GFR, Estimated 4 (*)    Anion gap 18 (*)    All other  components within normal limits  D-DIMER, QUANTITATIVE (NOT AT [facility]) - Abnormal; Notable for the following components:   D-Dimer, Quant 1.39 (*)    All other components within normal limits   LACTATE DEHYDROGENASE - Abnormal; Notable for the following components:   LDH 202 (*)    All other components within normal limits  FERRITIN - Abnormal; Notable for the following components:   Ferritin 979 (*)    All other components within normal limits  SARS CORONAVIRUS 2 BY RT PCR (HOSPITAL ORDER, [facility])  CULTURE, BLOOD (ROUTINE X 2)  CULTURE, BLOOD (ROUTINE X 2)  LACTIC ACID, PLASMA  PROCALCITONIN  TRIGLYCERIDES  FIBRINOGEN  C-REACTIVE PROTEIN  LACTIC ACID, PLASMA    EKG EKG Interpretation  Date/Time:  Monday November 19 2020 19:31:17 EST Ventricular Rate:  83 PR Interval:    QRS Duration: 103 QT Interval:  382 QTC Calculation: 449 R Axis:   38 Text Interpretation: Accelerated junctional rhythm Anteroseptal infarct, old Repol abnrm suggests ischemia, anterolateral No significant change since last tracing Confirmed by Isla Pence [phone]) on 11/19/2020 7:47:47 PM   Radiology DG Chest Port 1 View  Result Date: 11/19/2020 CLINICAL DATA:  Short of breath, previous tobacco abuse, COPD EXAM: PORTABLE CHEST 1 VIEW COMPARISON:  10/26/2020 FINDINGS: Single frontal view of the chest demonstrates mild enlargement of the cardiac silhouette. There is diffuse increased interstitial prominence with bilateral ground-glass airspace disease. Trace right effusion is noted. No pneumothorax. IMPRESSION: 1. Findings most consistent with congestive heart failure. Electronically Signed   By: Randa Ngo M.D.   On: 11/19/2020 19:52    Procedures Procedures   Medications Ordered in ED Medications  Chlorhexidine Gluconate Cloth 2 % PADS 6 each (has no administration in time range)  pentafluoroprop-tetrafluoroeth (GEBAUERS) aerosol 1 application (has no administration in time range)  lidocaine (PF) (XYLOCAINE) 1 % injection 5 mL (has no administration in time range)  lidocaine-prilocaine (EMLA) cream 1 application (has no administration in time range)  0.9 %   sodium chloride infusion (has no administration in time range)  0.9 %  sodium chloride infusion (has no administration in time range)  heparin injection 2,400 Units (has no administration in time range)  calcitRIOL (ROCALTROL) capsule 2.25 mcg (has no administration in time range)  cinacalcet (SENSIPAR) tablet 30 mg (has no administration in time range)  sodium zirconium cyclosilicate (LOKELMA) packet 10 g (10 g Oral Given 11/19/20 2245)    ED Course  I have reviewed the triage vital signs and the nursing notes.  Pertinent labs & imaging results that were available during my care of the patient were reviewed by me and considered in my medical decision making (see chart for details).    MDM Rules/Calculators/A&P                          Pt is due for dialysis tomorrow, but is hypoxic at 88% on ra.  His sats are in the mid-90s on 2L  K is also slightly high.  He is given Lokelma.  Covid negative.  Pt d/w Dr. Hollie Salk (nephrology) who will put him on the list.  She said they do have inpatient beds for dialysis tomorrow.  Pt d/w Dr. Marlowe Sax (triad) for admission.  [name] was evaluated in Emergency Department on 11/19/2020 for the symptoms described in the history of present illness. He was evaluated in the context of the global COVID-19 pandemic, which necessitated consideration that the  patient might be at risk for infection with the SARS-CoV-2 virus that causes COVID-19. Institutional protocols and algorithms that pertain to the evaluation of patients at risk for COVID-19 are in a state of rapid change based on information released by regulatory bodies including the CDC and federal and state organizations. These policies and algorithms were followed during the patient's care in the ED.  CRITICAL CARE Performed by: Isla Pence   Total critical care time: 30 minutes  Critical care time was exclusive of separately billable procedures and treating other patients.  Critical care was  necessary to treat or prevent imminent or life-threatening deterioration.  Critical care was time spent personally by me on the following activities: development of treatment plan with patient and/or surrogate as well as nursing, discussions with consultants, evaluation of patient's response to treatment, examination of patient, obtaining history from patient or surrogate, ordering and performing treatments and interventions, ordering and review of laboratory studies, ordering and review of radiographic studies, pulse oximetry and re-evaluation of patient's condition.  Final Clinical Impression(s) / ED Diagnoses Final diagnoses:  ESRD on hemodialysis ([place])  Acute respiratory failure with hypoxia ([place])  Acute on chronic congestive heart failure, unspecified heart failure type ([place])  Hyperkalemia    Rx / DC Orders ED Discharge Orders    None       Isla Pence, MD 11/19/20 2306

## 2020-11-19 NOTE — H&P (Addendum)
History and Physical    Kenneth [name] [name] DOB: 05-06-1956 DOA: 11/19/2020  PCP: [name], MD Patient coming from: Home  Chief Complaint: Shortness of breath  HPI: Kenneth [name] is a 65 y.o. male with medical history significant of multivessel CAD-not felt to be a good candidate for PCI/CABG and medically managed, PEA arrest in 2019, chronic combined systolic and diastolic heart failure, paroxysmal A. fib, ESRD on HD TTS, hypertension presenting to the ED via EMS for evaluation of shortness of breath.  SPO2 91% on room air with EMS, placed on nonrebreather.  Patient is a poor historian.  Reports feeling short of breath for the past 2 months.  Also endorsing orthopnea.  Denies lower extremity edema.  States for the past 7 months he is having throbbing left-sided chest pain which comes and goes.  Today his chest pain started again in the morning at rest.  Denies fevers or cough.  States he is vaccinated against Covid including booster shot.  No additional history could be obtained from him.  ED Course: No temperature recorded.  Not tachycardic.  Slightly tachypneic.  SPO2 88% on room air, placed on supplemental oxygen via nasal cannula.  Blood pressure elevated with systolic up to [id number].  WBC 9.4, hemoglobin 11.5 (at baseline), platelet count 282K.  Sodium 140, potassium 5.4, chloride 99, bicarb 23, BUN 65, creatinine 12.2, glucose 81.  Lactic acid 1.0.  Procalcitonin <0.10.  SARS-CoV-2 PCR test negative.  D-dimer 1.39.  Blood culture x2 pending.  Chest x-ray showing pulmonary edema.  ED provider discussed the case with Dr. Hollie Salk from nephrology who will put him on the list for dialysis in the morning.  Patient was given [facility] for mild hyperkalemia.  Review of Systems:  All systems reviewed and apart from history of presenting illness, are negative.  Past Medical History:  Diagnosis Date  . Acute CHF ([place]) 01/2018  . Cardiomyopathy secondary    likely related to HTN heart  disease; possibly ETOH related as well  . Chronic combined systolic and diastolic heart failure (HCC)    Echocardiogram 09/22/11: Moderate LVH, EF 99991111, grade 3 diastolic dysfunction, mild MR, moderate to severe LAE, mild RVE, mild to moderate TR, small to moderate pericardial effusion  . Coronary artery disease    not felt to be candidate for CABG 08/2018; medical thearpy recomended due to high risk of PCI   . Dysrhythmia    aflutter 04/2016, afib 02/2018, not felt to be a candidate for anticoagulation due to non-compliance and ETOH  . ESRD (end stage renal disease) on dialysis ([place])    due to hypertensive nephrosclerosis; TTS; [address] (09/01/2018)  . History of alcohol abuse   . Hypertension   . Myocardial infarction [facility])    " mild " per daughter  . PEA (Pulseless electrical activity) ([place])    PEA arrest 03/09/18, treated empirically for hyperkalemia, shock x1 for WCT, given amiodarone, ROSC after 10 min of ACLS    Past Surgical History:  Procedure Laterality Date  . AV FISTULA PLACEMENT Left 05/14/2016   Procedure: LEFT ARM BASILIC VEIN TRANSPOSITION;  Surgeon: Rosetta Posner, MD;  Location: [place];  Service: Vascular;  Laterality: Left;  . INSERTION OF DIALYSIS CATHETER N/A 06/20/2019   Procedure: INSERTION OF TUNNELED  DIALYSIS CATHETER;  Surgeon: Elam Dutch, MD;  Location: [facility] OR;  Service: Vascular;  Laterality: N/A;  . LEFT HEART CATH AND CORONARY ANGIOGRAPHY N/A 09/02/2018   Procedure: LEFT HEART CATH AND CORONARY  ANGIOGRAPHY;  Surgeon: Nelva Bush, MD;  Location: [place] CV LAB;  Service: Cardiovascular;  Laterality: N/A;  . LEFT HEART CATH AND CORONARY ANGIOGRAPHY N/A 05/14/2020   Procedure: LEFT HEART CATH AND CORONARY ANGIOGRAPHY;  Surgeon: Sherren Mocha, MD;  Location: [place] CV LAB;  Service: Cardiovascular;  Laterality: N/A;  . PERIPHERAL VASCULAR CATHETERIZATION N/A 05/13/2016   Procedure: Dialysis/Perma Catheter Insertion;  Surgeon: Serafina Mitchell, MD;   Location: [place] CV LAB;  Service: Cardiovascular;  Laterality: N/A;  . REVISION OF ARTERIOVENOUS GORETEX GRAFT Left 0000000   Procedure: PLICATION OF ARTERIOVENOUS FISTULA LEFT ARM;  Surgeon: Elam Dutch, MD;  Location: [place];  Service: Vascular;  Laterality: Left;     reports that he quit smoking about 3 years ago. His smoking use included cigars. He has a 96.00 pack-year smoking history. He has quit using smokeless tobacco.  His smokeless tobacco use included chew. He reports previous alcohol use. He reports that he does not use drugs.  No Known Allergies  Family History  Problem Relation Age of Onset  . Emphysema Mother   . Cirrhosis Father     Prior to Admission medications   Medication Sig Start Date End Date Taking? Authorizing Provider  amLODipine (NORVASC) 10 MG tablet Take 0.5 tablets (5 mg total) by mouth daily. 10/20/20  Yes Kathie Dike, MD  aspirin EC 81 MG EC tablet Take 1 tablet (81 mg total) by mouth daily. Swallow whole. 05/16/20  Yes Alma Friendly, MD  atorvastatin (LIPITOR) 80 MG tablet Take 1 tablet (80 mg total) by mouth daily. Patient taking differently: Take 80 mg by mouth at bedtime. 10/09/20  Yes Kilroy, Luke K, PA-C  B Complex-C-Folic Acid (RENA-VITE RX) 1 MG TABS Take 1 tablet by mouth daily. 10/05/20  Yes [provider]  clopidogrel (PLAVIX) 75 MG tablet Take 1 tablet (75 mg total) by mouth daily. Patient taking differently: Take 75 mg by mouth every evening. 10/09/20  Yes Kilroy, Luke K, PA-C  diphenhydramine-acetaminophen (TYLENOL PM EXTRA STRENGTH) 25-500 MG TABS tablet Take 2 tablets by mouth at bedtime as needed (pain). 05/12/18  Yes [provider]  famotidine (PEPCID) 20 MG tablet Take 1 tablet (20 mg total) by mouth 2 (two) times daily as needed for heartburn. 10/28/20  Yes Vashti Hey, MD  isosorbide mononitrate (IMDUR) 30 MG 24 hr tablet Take 3 tablets (90 mg total) by mouth daily. 10/29/20 11/28/20 Yes  Vashti Hey, MD  lanthanum (FOSRENOL) 1000 MG chewable tablet Chew 1,000 mg by mouth 3 (three) times daily. 08/23/20  Yes [provider]  metoprolol succinate (TOPROL-XL) 25 MG 24 hr tablet Take 25 mg by mouth in the morning and at bedtime.   Yes [provider]  nitroGLYCERIN (NITROSTAT) 0.4 MG SL tablet Place 1 tablet (0.4 mg total) under the tongue every 5 (five) minutes as needed for chest pain (max 3 doses). 10/13/20  Yes Truddie Hidden, MD  pantoprazole (PROTONIX) 40 MG tablet Take 1 tablet (40 mg total) by mouth daily at 6 (six) AM. 10/29/20 11/28/20 Yes Vashti Hey, MD  VITAMIN D PO Take 1 tablet by mouth daily. 11/15/20 11/14/21 Yes [provider]    Physical Exam: Vitals:   11/19/20 2115 11/19/20 2215 11/19/20 2300 11/19/20 2345  BP: (!) 177/93 (!) 183/77 (!) 185/97 (!) 166/134  Pulse: 78 (!) 51 85 99  Resp: (!) 26 (!) 22  (!) 22  SpO2: 95% 97% 99% 90%  Weight:  Height:        Physical Exam Constitutional:      General: He is not in acute distress. HENT:     Head: Normocephalic and atraumatic.  Eyes:     Extraocular Movements: Extraocular movements intact.     Conjunctiva/sclera: Conjunctivae normal.  Cardiovascular:     Rate and Rhythm: Normal rate and regular rhythm.     Pulses: Normal pulses.  Pulmonary:     Effort: Pulmonary effort is normal.     Breath sounds: Rales present. No wheezing.  Abdominal:     General: Bowel sounds are normal. There is no distension.     Palpations: Abdomen is soft.     Tenderness: There is no abdominal tenderness.  Musculoskeletal:        General: No swelling or tenderness.     Cervical back: Normal range of motion and neck supple.  Skin:    General: Skin is warm and dry.  Neurological:     General: No focal deficit present.     Mental Status: He is alert and oriented to person, place, and time.     Labs on Admission: I have personally reviewed following labs and  imaging studies  CBC: Recent Labs  Lab 11/19/20 1934  WBC 9.4  NEUTROABS 6.3  HGB 11.5*  HCT 35.4*  MCV 96.7  PLT [id number]   Basic Metabolic Panel: Recent Labs  Lab 11/19/20 1934  NA 140  K 5.4*  CL 99  CO2 23  GLUCOSE 81  BUN 65*  CREATININE 12.24*  CALCIUM 9.1   GFR: Estimated Creatinine Clearance: 6.8 mL/min (A) (by C-G formula based on SCr of 12.24 mg/dL (H)). Liver Function Tests: Recent Labs  Lab 11/19/20 1934  AST 14*  ALT 10  ALKPHOS 60  BILITOT 0.8  PROT 8.1  ALBUMIN 3.1*   No results for input(s): LIPASE, AMYLASE in the last 168 hours. No results for input(s): AMMONIA in the last 168 hours. Coagulation Profile: No results for input(s): INR, PROTIME in the last 168 hours. Cardiac Enzymes: No results for input(s): CKTOTAL, CKMB, CKMBINDEX, TROPONINI in the last 168 hours. BNP (last 3 results) No results for input(s): PROBNP in the last 8760 hours. HbA1C: No results for input(s): HGBA1C in the last 72 hours. CBG: No results for input(s): GLUCAP in the last 168 hours. Lipid Profile: Recent Labs    11/19/20 1934  TRIG 90   Thyroid Function Tests: No results for input(s): TSH, T4TOTAL, FREET4, T3FREE, THYROIDAB in the last 72 hours. Anemia Panel: Recent Labs    11/19/20 1934  FERRITIN 979*   Urine analysis:    Component Value Date/Time   COLORURINE YELLOW 05/12/2016 0834   APPEARANCEUR CLOUDY (A) 05/12/2016 0834   LABSPEC 1.014 05/12/2016 0834   PHURINE 5.0 05/12/2016 0834   GLUCOSEU NEGATIVE 05/12/2016 0834   HGBUR MODERATE (A) 05/12/2016 0834   BILIRUBINUR NEGATIVE 05/12/2016 0834   KETONESUR NEGATIVE 05/12/2016 0834   PROTEINUR 100 (A) 05/12/2016 0834   UROBILINOGEN 0.2 12/09/2013 0013   NITRITE NEGATIVE 05/12/2016 0834   LEUKOCYTESUR NEGATIVE 05/12/2016 0834    Radiological Exams on Admission: DG Chest Port 1 View  Result Date: 11/19/2020 CLINICAL DATA:  Short of breath, previous tobacco abuse, COPD EXAM: PORTABLE CHEST 1 VIEW  COMPARISON:  10/26/2020 FINDINGS: Single frontal view of the chest demonstrates mild enlargement of the cardiac silhouette. There is diffuse increased interstitial prominence with bilateral ground-glass airspace disease. Trace right effusion is noted. No pneumothorax. IMPRESSION: 1. Findings most  consistent with congestive heart failure. Electronically Signed   By: Randa Ngo M.D.   On: 11/19/2020 19:52    EKG: Independently reviewed.  Junctional rhythm.  T wave inversions in inferior and lateral leads similar to prior tracing.  No acute ischemic changes.  Assessment/Plan Principal Problem:   Volume overload Active Problems:   ESRD on hemodialysis (HCC)   Acute hypoxemic respiratory failure (HCC)   CAD (coronary artery disease)   PAF (paroxysmal atrial fibrillation) (HCC)   Acute hypoxemic respiratory failure secondary to pulmonary edema in the setting of ESRD on HD and chronic combined systolic and diastolic heart failure: His last dialysis session was yesterday.  SPO2 88% on room air, currently satting well on 4 L supplemental oxygen.  Endorsing dyspnea and orthopnea.  Has bibasilar rales on exam and chest x-ray showing pulmonary edema. -Nephrology consulted by ED provider and planning on taking patient for dialysis tomorrow.  Continuous pulse ox, supplemental oxygen as needed to keep oxygen saturation above 92%. -Of note, when I entered the room I noticed that patient was wearing his nasal cannula on his head instead of having the prongs in his nostrils.  He was satting 89% on room air.  He has been educated and after nasal cannula was placed appropriately, sats improved to mid 90s on 4 L supplemental oxygen.  Hypertensive urgency/volume overload: Blood pressure elevated with systolic up to [id number]. -Continue amlodipine, Imdur, and metoprolol.  IV hydralazine PRN SBP >170.  Going for dialysis in the morning as mentioned above.  Mild hyperkalemia in the setting of ESRD -Patient received  Lokelma in the ED and nephrology planning on taking him for dialysis in the morning.  Continue to monitor potassium level.  Elevated D-dimer: D-dimer slightly elevated at 1.39 in the setting of end-stage renal disease.  Dyspnea and hypoxemia appear to be due to pulmonary edema/volume overload. -Bilateral lower extremity Dopplers ordered to rule out DVT.  If dyspnea persists despite dialysis/addressing volume overload then consider additional work-up to rule out PE.  Paroxysmal A. fib:  Not in A. fib at present. Followed by cardiology and not on chronic anticoagulation due to noncompliance. -Continue metoprolol  Chest pain in the setting of known multivessel CAD: Patient is endorsing ongoing left-sided throbbing chest pain for the past 7 months.  Chest pain started again this morning at rest.  Appears comfortable on exam.  Per recent cardiology note from 10/28/2020 patient was felt not to be a good candidate for CABG or PCI and medical management was recommended. -Cardiac monitoring.  Trend high-sensitivity troponin level although will likely continue to be slightly elevated due to his end-stage renal disease.  Continue aspirin, Plavix, Lipitor, Imdur, and metoprolol.  Since he is having ongoing chest pain, will likely need adjustment of antianginal therapy.  Please discuss with cardiology in the morning. Addendum: EKG without acute ischemic changes as mentioned above.  Addendum: Troponin elevated at 133.  Discussed with Dr. Harrell Gave -recommending holding unfractionated heparin for now and repeating troponin level.  If significantly elevated, then start IV heparin.  Cardiology team will consult, appreciate help.  DVT prophylaxis: Subcutaneous heparin Code Status: Patient wishes to be full code. Family Communication: No family available this time. Disposition Plan: Status is: Observation  The patient remains OBS appropriate and will d/c before 2 midnights.  Dispo: The patient is from: Home               Anticipated d/c is to: Home  Anticipated d/c date is: 2 days              Patient currently is not medically stable to d/c.   Difficult to place patient No  The medical decision making on this patient was of high complexity and the patient is at high risk for clinical deterioration, therefore this is a level 3 visit.  Shela Leff MD [facility]  If 7PM-7AM, please contact night-coverage [email or web address]  11/20/2020, 12:18 AM

## 2020-11-19 NOTE — H&P (Incomplete)
History and Physical    Kenneth [name] [name] DOB: 07-29-1956 DOA: 11/19/2020  PCP: [name], MD Patient coming from: Home  Chief Complaint: Shortness of breath  HPI: Kenneth [name] is a 65 y.o. male with medical history significant of multivessel CAD-not felt to be a good candidate for PCI/CABG and medically managed, PEA arrest in 2019, chronic combined systolic and diastolic heart failure, paroxysmal A. fib, ESRD on HD TTS, hypertension presenting to the ED via EMS for evaluation of shortness of breath.  SPO2 91% on room air with EMS, placed on nonrebreather.  ED Course: No temperature recorded.  Not tachycardic.  Slightly tachypneic.  SPO2 88% on room air, sats improved with 2 L supplemental oxygen.  Blood pressure elevated with systolic up to [id number].  WBC 9.4, hemoglobin 11.5 (at baseline), platelet count 282K.  Sodium 140, potassium 5.4, chloride 99, bicarb 23, BUN 65, creatinine 12.2, glucose 81.  Lactic acid 1.0.  Procalcitonin <0.10.  SARS-CoV-2 PCR test negative.  D-dimer 1.39.  Blood culture x2 pending.  Chest x-ray showing pulmonary edema.  ED provider discussed the case with Dr. Hollie Salk from nephrology who will put him on the list for dialysis tomorrow.  Patient was given [facility] for mild hyperkalemia.  Review of Systems:  All systems reviewed and apart from history of presenting illness, are negative.  Past Medical History:  Diagnosis Date  . Acute CHF ([place]) 01/2018  . Cardiomyopathy secondary    likely related to HTN heart disease; possibly ETOH related as well  . Chronic combined systolic and diastolic heart failure (HCC)    Echocardiogram 09/22/11: Moderate LVH, EF [id number], grade 3 diastolic dysfunction, mild MR, moderate to severe LAE, mild RVE, mild to moderate TR, small to moderate pericardial effusion  . Coronary artery disease    not felt to be candidate for CABG 08/2018; medical thearpy recomended due to high risk of PCI   . Dysrhythmia    aflutter  04/2016, afib 02/2018, not felt to be a candidate for anticoagulation due to non-compliance and ETOH  . ESRD (end stage renal disease) on dialysis ([place])    due to hypertensive nephrosclerosis; TTS; [address] (09/01/2018)  . History of alcohol abuse   . Hypertension   . Myocardial infarction [facility])    " mild " per daughter  . PEA (Pulseless electrical activity) ([place])    PEA arrest 03/09/18, treated empirically for hyperkalemia, shock x1 for WCT, given amiodarone, ROSC after 10 min of ACLS    Past Surgical History:  Procedure Laterality Date  . AV FISTULA PLACEMENT Left 05/14/2016   Procedure: LEFT ARM BASILIC VEIN TRANSPOSITION;  Surgeon: Rosetta Posner, MD;  Location: [place];  Service: Vascular;  Laterality: Left;  . INSERTION OF DIALYSIS CATHETER N/A 06/20/2019   Procedure: INSERTION OF TUNNELED  DIALYSIS CATHETER;  Surgeon: Elam Dutch, MD;  Location: [place];  Service: Vascular;  Laterality: N/A;  . LEFT HEART CATH AND CORONARY ANGIOGRAPHY N/A 09/02/2018   Procedure: LEFT HEART CATH AND CORONARY ANGIOGRAPHY;  Surgeon: Nelva Bush, MD;  Location: [place] CV LAB;  Service: Cardiovascular;  Laterality: N/A;  . LEFT HEART CATH AND CORONARY ANGIOGRAPHY N/A 05/14/2020   Procedure: LEFT HEART CATH AND CORONARY ANGIOGRAPHY;  Surgeon: Sherren Mocha, MD;  Location: [place] CV LAB;  Service: Cardiovascular;  Laterality: N/A;  . PERIPHERAL VASCULAR CATHETERIZATION N/A 05/13/2016   Procedure: Dialysis/Perma Catheter Insertion;  Surgeon: Serafina Mitchell, MD;  Location: [place] CV LAB;  Service: Cardiovascular;  Laterality: N/A;  . REVISION OF ARTERIOVENOUS GORETEX GRAFT Left 0000000   Procedure: PLICATION OF ARTERIOVENOUS FISTULA LEFT ARM;  Surgeon: Elam Dutch, MD;  Location: [place];  Service: Vascular;  Laterality: Left;     reports that he quit smoking about 3 years ago. His smoking use included cigars. He has a 96.00 pack-year smoking history. He has quit using smokeless tobacco.   His smokeless tobacco use included chew. He reports previous alcohol use. He reports that he does not use drugs.  No Known Allergies  Family History  Problem Relation Age of Onset  . Emphysema Mother   . Cirrhosis Father     Prior to Admission medications   Medication Sig Start Date End Date Taking? Authorizing Provider  amLODipine (NORVASC) 10 MG tablet Take 0.5 tablets (5 mg total) by mouth daily. 10/20/20  Yes Kathie Dike, MD  aspirin EC 81 MG EC tablet Take 1 tablet (81 mg total) by mouth daily. Swallow whole. 05/16/20  Yes Alma Friendly, MD  atorvastatin (LIPITOR) 80 MG tablet Take 1 tablet (80 mg total) by mouth daily. Patient taking differently: Take 80 mg by mouth at bedtime. 10/09/20  Yes Kilroy, Luke K, PA-C  B Complex-C-Folic Acid (RENA-VITE RX) 1 MG TABS Take 1 tablet by mouth daily. 10/05/20  Yes [provider]  clopidogrel (PLAVIX) 75 MG tablet Take 1 tablet (75 mg total) by mouth daily. Patient taking differently: Take 75 mg by mouth every evening. 10/09/20  Yes Kilroy, Luke K, PA-C  diphenhydramine-acetaminophen (TYLENOL PM EXTRA STRENGTH) 25-500 MG TABS tablet Take 2 tablets by mouth at bedtime as needed (pain). 05/12/18  Yes [provider]  famotidine (PEPCID) 20 MG tablet Take 1 tablet (20 mg total) by mouth 2 (two) times daily as needed for heartburn. 10/28/20  Yes Vashti Hey, MD  isosorbide mononitrate (IMDUR) 30 MG 24 hr tablet Take 3 tablets (90 mg total) by mouth daily. 10/29/20 11/28/20 Yes Vashti Hey, MD  lanthanum (FOSRENOL) 1000 MG chewable tablet Chew 1,000 mg by mouth 3 (three) times daily. 08/23/20  Yes [provider]  metoprolol succinate (TOPROL-XL) 25 MG 24 hr tablet Take 25 mg by mouth in the morning and at bedtime.   Yes [provider]  nitroGLYCERIN (NITROSTAT) 0.4 MG SL tablet Place 1 tablet (0.4 mg total) under the tongue every 5 (five) minutes as needed for chest pain (max 3  doses). 10/13/20  Yes Truddie Hidden, MD  pantoprazole (PROTONIX) 40 MG tablet Take 1 tablet (40 mg total) by mouth daily at 6 (six) AM. 10/29/20 11/28/20 Yes Vashti Hey, MD  VITAMIN D PO Take 1 tablet by mouth daily. 11/15/20 11/14/21 Yes [provider]    Physical Exam: Vitals:   11/19/20 2100 11/19/20 2115 11/19/20 2215 11/19/20 2300  BP: (!) 178/101 (!) 177/93 (!) 183/77 (!) 185/97  Pulse: 78 78 (!) 51 85  Resp: 17 (!) 26 (!) 22   SpO2: 96% 95% 97% 99%  Weight:      Height:        ***  Labs on Admission: I have personally reviewed following labs and imaging studies  CBC: Recent Labs  Lab 11/19/20 1934  WBC 9.4  NEUTROABS 6.3  HGB 11.5*  HCT 35.4*  MCV 96.7  PLT [id number]   Basic Metabolic Panel: Recent Labs  Lab 11/19/20 1934  NA 140  K 5.4*  CL 99  CO2 23  GLUCOSE 81  BUN 65*  CREATININE 12.24*  CALCIUM 9.1   GFR: Estimated Creatinine Clearance: 6.8 mL/min (A) (by C-G formula based on SCr of 12.24 mg/dL (H)). Liver Function Tests: Recent Labs  Lab 11/19/20 1934  AST 14*  ALT 10  ALKPHOS 60  BILITOT 0.8  PROT 8.1  ALBUMIN 3.1*   No results for input(s): LIPASE, AMYLASE in the last 168 hours. No results for input(s): AMMONIA in the last 168 hours. Coagulation Profile: No results for input(s): INR, PROTIME in the last 168 hours. Cardiac Enzymes: No results for input(s): CKTOTAL, CKMB, CKMBINDEX, TROPONINI in the last 168 hours. BNP (last 3 results) No results for input(s): PROBNP in the last 8760 hours. HbA1C: No results for input(s): HGBA1C in the last 72 hours. CBG: No results for input(s): GLUCAP in the last 168 hours. Lipid Profile: Recent Labs    11/19/20 1934  TRIG 90   Thyroid Function Tests: No results for input(s): TSH, T4TOTAL, FREET4, T3FREE, THYROIDAB in the last 72 hours. Anemia Panel: Recent Labs    11/19/20 1934  FERRITIN 979*   Urine analysis:    Component Value Date/Time   COLORURINE YELLOW  05/12/2016 0834   APPEARANCEUR CLOUDY (A) 05/12/2016 0834   LABSPEC 1.014 05/12/2016 0834   PHURINE 5.0 05/12/2016 0834   GLUCOSEU NEGATIVE 05/12/2016 0834   HGBUR MODERATE (A) 05/12/2016 0834   BILIRUBINUR NEGATIVE 05/12/2016 0834   KETONESUR NEGATIVE 05/12/2016 0834   PROTEINUR 100 (A) 05/12/2016 0834   UROBILINOGEN 0.2 12/09/2013 0013   NITRITE NEGATIVE 05/12/2016 0834   LEUKOCYTESUR NEGATIVE 05/12/2016 0834    Radiological Exams on Admission: DG Chest Port 1 View  Result Date: 11/19/2020 CLINICAL DATA:  Short of breath, previous tobacco abuse, COPD EXAM: PORTABLE CHEST 1 VIEW COMPARISON:  10/26/2020 FINDINGS: Single frontal view of the chest demonstrates mild enlargement of the cardiac silhouette. There is diffuse increased interstitial prominence with bilateral ground-glass airspace disease. Trace right effusion is noted. No pneumothorax. IMPRESSION: 1. Findings most consistent with congestive heart failure. Electronically Signed   By: Randa Ngo M.D.   On: 11/19/2020 19:52    EKG: Independently reviewed.  Junctional rhythm.  T wave inversions in inferior and lateral leads similar to prior tracing.  No acute ischemic changes.  Assessment/Plan Active Problems:   Volume overload   Acute hypoxemic respiratory failure secondary to pulmonary edema/volume overload in the setting of end-stage renal disease and chronic combined systolic and diastolic heart failure: His last dialysis session was yesterday.  SPO2 88% on room air, currently satting well on 2 L supplemental oxygen.  ***Appears volume overloaded on exam***chest x-ray showing pulmonary edema. -Nephrology consulted by ED provider and planning on taking patient for dialysis tomorrow.  Hypertensive urgency:    ED Course: No temperature recorded.  Not tachycardic.  Slightly tachypneic.  SPO2 88% on room air, sats improved with 2 L supplemental oxygen.  Blood pressure elevated with systolic up to [id number].  WBC 9.4, hemoglobin  11.5 (at baseline), platelet count 282K.  Sodium 140, potassium 5.4, chloride 99, bicarb 23, BUN 65, creatinine 12.2, glucose 81.  Lactic acid 1.0.  Procalcitonin <0.10.  SARS-CoV-2 PCR test negative.  D-dimer 1.39.  Blood culture x2 pending.  Chest x-ray showing pulmonary edema.  ED provider discussed the case with Dr. Hollie Salk from nephrology who will put him on the list for dialysis tomorrow.  Patient was given [facility] for mild hyperkalemia.  History of CAD High-sensitivity troponin mildly elevated 34 >53.  EKG without acute ischemic changes.  Chest pain has now resolved.  PE less likely given no tachycardia or hypoxia.  Patient was recently admitted to the hospital 12/21-12/25 for chest pain.  He has a known history of severe multivessel CAD and was felt not to be a candidate for PCI or CABG.  Medical management was recommended by cardiology.  Last cardiac catheterization was in July 2021. -Cardiac monitoring, trend troponin.  Resume home beta-blocker, statin, and Imdur after pharmacy med rec is done.  Resume home aspirin if hemoglobin is stable on repeat labs given question of hematemesis (see below).  Sublingual nitroglycerin as needed.  I have sent an inbox message to Gay Filler requesting cardiology consultation in the morning.  ESRD on HD TTS: Patient states his last dialysis session was 3 days ago.  Blood pressure slightly elevated but does not appear volume overloaded on exam.  Potassium and bicarb levels normal.  No indication for urgent dialysis overnight. -Consult nephrology in the morning for dialysis.  Chronic combined systolic and diastolic CHF: Does not appear volume overloaded on exam. -Volume management with hemodialysis.  Hypertension: Systolic currently in the 150s. -Resume home amlodipine, Imdur, and metoprolol after pharmacy med rec is done.  Paroxysmal A. fib: Currently in sinus rhythm.  Followed by cardiology and felt not to be a candidate for anticoagulation due to  history of medication noncompliance. -Resume metoprolol after pharmacy med rec is done.  DVT prophylaxis: ***  Code Status: *** Family Communication: ***  Disposition Plan: Status is: Observation  {Observation:23811}  Dispo: The patient is from: {From:23814}              Anticipated d/c is to: {To:23815}              Anticipated d/c date is: {Days:23816}              Patient currently {Medically stable:23817}   Difficult to place patient {Yes/No:25151}       Consults called: *** Admission status: ***  The medical decision making on this patient was of high complexity and the patient is at high risk for clinical deterioration, therefore this is a level 3 visit.***  The medical decision making is of moderate complexity, therefore this is a level 2 visit.***  Shela Leff MD [facility]  If 7PM-7AM, please contact night-coverage [email or web address]  11/19/2020, 11:47 PM

## 2020-11-20 ENCOUNTER — Emergency Department (HOSPITAL_BASED_OUTPATIENT_CLINIC_OR_DEPARTMENT_OTHER): Payer: Medicare Other

## 2020-11-20 DIAGNOSIS — E875 Hyperkalemia: Secondary | ICD-10-CM | POA: Diagnosis present

## 2020-11-20 DIAGNOSIS — I252 Old myocardial infarction: Secondary | ICD-10-CM | POA: Diagnosis not present

## 2020-11-20 DIAGNOSIS — Z87891 Personal history of nicotine dependence: Secondary | ICD-10-CM | POA: Diagnosis not present

## 2020-11-20 DIAGNOSIS — I5020 Unspecified systolic (congestive) heart failure: Secondary | ICD-10-CM | POA: Diagnosis not present

## 2020-11-20 DIAGNOSIS — Z825 Family history of asthma and other chronic lower respiratory diseases: Secondary | ICD-10-CM | POA: Diagnosis not present

## 2020-11-20 DIAGNOSIS — I472 Ventricular tachycardia: Secondary | ICD-10-CM | POA: Diagnosis present

## 2020-11-20 DIAGNOSIS — G8929 Other chronic pain: Secondary | ICD-10-CM | POA: Diagnosis present

## 2020-11-20 DIAGNOSIS — I25119 Atherosclerotic heart disease of native coronary artery with unspecified angina pectoris: Secondary | ICD-10-CM | POA: Diagnosis not present

## 2020-11-20 DIAGNOSIS — I480 Paroxysmal atrial fibrillation: Secondary | ICD-10-CM | POA: Diagnosis not present

## 2020-11-20 DIAGNOSIS — Z992 Dependence on renal dialysis: Secondary | ICD-10-CM | POA: Diagnosis not present

## 2020-11-20 DIAGNOSIS — I5042 Chronic combined systolic (congestive) and diastolic (congestive) heart failure: Secondary | ICD-10-CM | POA: Diagnosis not present

## 2020-11-20 DIAGNOSIS — I132 Hypertensive heart and chronic kidney disease with heart failure and with stage 5 chronic kidney disease, or end stage renal disease: Secondary | ICD-10-CM | POA: Diagnosis present

## 2020-11-20 DIAGNOSIS — J449 Chronic obstructive pulmonary disease, unspecified: Secondary | ICD-10-CM | POA: Diagnosis present

## 2020-11-20 DIAGNOSIS — D649 Anemia, unspecified: Secondary | ICD-10-CM | POA: Diagnosis present

## 2020-11-20 DIAGNOSIS — I25118 Atherosclerotic heart disease of native coronary artery with other forms of angina pectoris: Secondary | ICD-10-CM

## 2020-11-20 DIAGNOSIS — E8889 Other specified metabolic disorders: Secondary | ICD-10-CM | POA: Diagnosis present

## 2020-11-20 DIAGNOSIS — I5033 Acute on chronic diastolic (congestive) heart failure: Secondary | ICD-10-CM

## 2020-11-20 DIAGNOSIS — Z20822 Contact with and (suspected) exposure to covid-19: Secondary | ICD-10-CM | POA: Diagnosis not present

## 2020-11-20 DIAGNOSIS — I429 Cardiomyopathy, unspecified: Secondary | ICD-10-CM | POA: Diagnosis not present

## 2020-11-20 DIAGNOSIS — N186 End stage renal disease: Secondary | ICD-10-CM | POA: Diagnosis present

## 2020-11-20 DIAGNOSIS — J9601 Acute respiratory failure with hypoxia: Secondary | ICD-10-CM | POA: Diagnosis present

## 2020-11-20 DIAGNOSIS — E8770 Fluid overload, unspecified: Secondary | ICD-10-CM | POA: Diagnosis present

## 2020-11-20 DIAGNOSIS — R7989 Other specified abnormal findings of blood chemistry: Secondary | ICD-10-CM | POA: Diagnosis present

## 2020-11-20 DIAGNOSIS — N250 Renal osteodystrophy: Secondary | ICD-10-CM | POA: Diagnosis not present

## 2020-11-20 DIAGNOSIS — I160 Hypertensive urgency: Secondary | ICD-10-CM | POA: Diagnosis present

## 2020-11-20 DIAGNOSIS — Z9114 Patient's other noncompliance with medication regimen: Secondary | ICD-10-CM | POA: Diagnosis not present

## 2020-11-20 DIAGNOSIS — R0602 Shortness of breath: Secondary | ICD-10-CM | POA: Diagnosis not present

## 2020-11-20 DIAGNOSIS — I10 Essential (primary) hypertension: Secondary | ICD-10-CM | POA: Diagnosis not present

## 2020-11-20 DIAGNOSIS — I2510 Atherosclerotic heart disease of native coronary artery without angina pectoris: Secondary | ICD-10-CM | POA: Diagnosis not present

## 2020-11-20 DIAGNOSIS — N2581 Secondary hyperparathyroidism of renal origin: Secondary | ICD-10-CM | POA: Diagnosis present

## 2020-11-20 DIAGNOSIS — Z8674 Personal history of sudden cardiac arrest: Secondary | ICD-10-CM | POA: Diagnosis not present

## 2020-11-20 LAB — CBC
HCT: 29.6 % — ABNORMAL LOW (ref 39.0–52.0)
Hemoglobin: 9.9 g/dL — ABNORMAL LOW (ref 13.0–17.0)
MCH: 31.7 pg (ref 26.0–34.0)
MCHC: 33.4 g/dL (ref 30.0–36.0)
MCV: 94.9 fL (ref 80.0–100.0)
Platelets: 244 10*3/uL (ref 150–400)
RBC: 3.12 MIL/uL — ABNORMAL LOW (ref 4.22–5.81)
RDW: 17.3 % — ABNORMAL HIGH (ref 11.5–15.5)
WBC: 8.6 10*3/uL (ref 4.0–10.5)
nRBC: 0 % (ref 0.0–0.2)

## 2020-11-20 LAB — RENAL FUNCTION PANEL
Albumin: 2.9 g/dL — ABNORMAL LOW (ref 3.5–5.0)
Anion gap: 18 — ABNORMAL HIGH (ref 5–15)
BUN: 71 mg/dL — ABNORMAL HIGH (ref 8–23)
CO2: 21 mmol/L — ABNORMAL LOW (ref 22–32)
Calcium: 8.6 mg/dL — ABNORMAL LOW (ref 8.9–10.3)
Chloride: 100 mmol/L (ref 98–111)
Creatinine, Ser: 13.54 mg/dL — ABNORMAL HIGH (ref 0.61–1.24)
GFR, Estimated: 4 mL/min — ABNORMAL LOW (ref >60)
Glucose, Bld: 87 mg/dL (ref 70–99)
Phosphorus: 6.3 mg/dL — ABNORMAL HIGH (ref 2.5–4.6)
Potassium: 5 mmol/L (ref 3.5–5.1)
Sodium: 139 mmol/L (ref 135–145)

## 2020-11-20 LAB — TROPONIN I (HIGH SENSITIVITY)
Troponin I (High Sensitivity): 133 ng/L (ref <18)
Troponin I (High Sensitivity): 181 ng/L (ref <18)
Troponin I (High Sensitivity): 169 ng/L (ref <18)

## 2020-11-20 MED ORDER — HEPARIN SODIUM (PORCINE) 1000 UNIT/ML DIALYSIS
1000.0000 [IU] | INTRAMUSCULAR | Status: DC | PRN
  Filled 2020-11-20: qty 1

## 2020-11-20 MED ORDER — METOPROLOL SUCCINATE ER 25 MG PO TB24
25.0000 mg | ORAL_TABLET | Freq: Every day | ORAL | Status: DC
  Administered 2020-11-21 – 2020-11-22 (×2): 25 mg via ORAL
  Filled 2020-11-20 (×2): qty 1

## 2020-11-20 MED ORDER — ALTEPLASE 2 MG IJ SOLR: 2.0000 mg | Freq: Once | INTRAMUSCULAR | Status: DC | PRN

## 2020-11-20 MED ORDER — PANTOPRAZOLE SODIUM 40 MG PO TBEC
40.0000 mg | DELAYED_RELEASE_TABLET | Freq: Every day | ORAL | Status: DC
  Administered 2020-11-20 – 2020-11-22 (×3): 40 mg via ORAL
  Filled 2020-11-20 (×3): qty 1

## 2020-11-20 MED ORDER — ACETAMINOPHEN 325 MG PO TABS
650.0000 mg | ORAL_TABLET | Freq: Four times a day (QID) | ORAL | Status: DC | PRN
  Administered 2020-11-20: 650 mg via ORAL
  Filled 2020-11-20: qty 2

## 2020-11-20 MED ORDER — ATORVASTATIN CALCIUM 80 MG PO TABS
80.0000 mg | ORAL_TABLET | Freq: Every day | ORAL | Status: DC
  Administered 2020-11-20 – 2020-11-21 (×3): 80 mg via ORAL
  Filled 2020-11-20 (×2): qty 2
  Filled 2020-11-20: qty 1

## 2020-11-20 MED ORDER — ASPIRIN EC 81 MG PO TBEC
81.0000 mg | DELAYED_RELEASE_TABLET | Freq: Every day | ORAL | Status: DC
  Administered 2020-11-21 – 2020-11-22 (×2): 81 mg via ORAL
  Filled 2020-11-20 (×2): qty 1

## 2020-11-20 MED ORDER — LANTHANUM CARBONATE 500 MG PO CHEW
1000.0000 mg | CHEWABLE_TABLET | Freq: Three times a day (TID) | ORAL | Status: DC
  Administered 2020-11-20 – 2020-11-22 (×5): 1000 mg via ORAL
  Filled 2020-11-20 (×10): qty 2

## 2020-11-20 MED ORDER — HYDRALAZINE HCL 20 MG/ML IJ SOLN: 5.0000 mg | INTRAMUSCULAR | Status: DC | PRN

## 2020-11-20 MED ORDER — NITROGLYCERIN 0.4 MG SL SUBL
0.4000 mg | SUBLINGUAL_TABLET | SUBLINGUAL | Status: DC | PRN
  Administered 2020-11-20 – 2020-11-22 (×2): 0.4 mg via SUBLINGUAL
  Filled 2020-11-20 (×2): qty 1

## 2020-11-20 MED ORDER — CLOPIDOGREL BISULFATE 75 MG PO TABS
75.0000 mg | ORAL_TABLET | Freq: Every evening | ORAL | Status: DC
  Administered 2020-11-20 – 2020-11-22 (×3): 75 mg via ORAL
  Filled 2020-11-20 (×3): qty 1

## 2020-11-20 MED ORDER — ISOSORBIDE MONONITRATE ER 60 MG PO TB24
90.0000 mg | ORAL_TABLET | Freq: Every day | ORAL | Status: DC
  Administered 2020-11-21 – 2020-11-22 (×2): 90 mg via ORAL
  Filled 2020-11-20 (×2): qty 1

## 2020-11-20 MED ORDER — HEPARIN SODIUM (PORCINE) 1000 UNIT/ML IJ SOLN
INTRAMUSCULAR | Status: AC
  Filled 2020-11-20: qty 3

## 2020-11-20 MED ORDER — ACETAMINOPHEN 650 MG RE SUPP: 650.0000 mg | Freq: Four times a day (QID) | RECTAL | Status: DC | PRN

## 2020-11-20 MED ORDER — MORPHINE SULFATE (PF) 2 MG/ML IV SOLN
2.0000 mg | INTRAVENOUS | Status: DC | PRN
  Administered 2020-11-20: 2 mg via INTRAVENOUS
  Filled 2020-11-20: qty 1

## 2020-11-20 MED ORDER — HEPARIN SODIUM (PORCINE) 5000 UNIT/ML IJ SOLN
5000.0000 [IU] | Freq: Three times a day (TID) | INTRAMUSCULAR | Status: DC
  Administered 2020-11-20 – 2020-11-22 (×6): 5000 [IU] via SUBCUTANEOUS
  Filled 2020-11-20 (×6): qty 1

## 2020-11-20 MED ORDER — AMLODIPINE BESYLATE 5 MG PO TABS
5.0000 mg | ORAL_TABLET | Freq: Every day | ORAL | Status: DC
  Administered 2020-11-21 – 2020-11-22 (×2): 5 mg via ORAL
  Filled 2020-11-20 (×2): qty 1

## 2020-11-20 MED ORDER — NEPRO/CARBSTEADY PO LIQD
237.0000 mL | Freq: Three times a day (TID) | ORAL | Status: DC
  Filled 2020-11-20: qty 237

## 2020-11-20 NOTE — Progress Notes (Signed)
PROGRESS NOTE  Kenneth [name] [id number] DOB: 11/09/1955 DOA: 11/19/2020 PCP: [name], MD  HPI/Recap of past 24 hours: Kenneth [name] is a 65 y.o. male with medical history significant of multivessel CAD-not felt to be a good candidate for PCI/CABG and medically managed, PEA arrest in 2019, chronic combined systolic and diastolic heart failure, paroxysmal A. fib, ESRD on HD TTS, hypertension presenting to the ED via EMS for evaluation of shortness of breath.  SPO2 91% on room air with EMS, placed on nonrebreather.  Patient is a poor historian.  Reports feeling short of breath for the past 2 months.  Also endorsing orthopnea.  Denies lower extremity edema.  States for the past 7 months he is having throbbing left-sided chest pain which comes and goes.  Today his chest pain started again in the morning at rest.  Denies fevers or cough.  States he is vaccinated against Covid including booster shot.  No additional history could be obtained from him.  ED Course: No temperature recorded.  Not tachycardic.  Slightly tachypneic.  SPO2 88% on room air, placed on supplemental oxygen via nasal cannula.  Blood pressure elevated with systolic up to [id number].  WBC 9.4, hemoglobin 11.5 (at baseline), platelet count 282K.  Sodium 140, potassium 5.4, chloride 99, bicarb 23, BUN 65, creatinine 12.2, glucose 81.  Lactic acid 1.0.  Procalcitonin <0.10.  SARS-CoV-2 PCR test negative.  D-dimer 1.39.  Blood culture x2 pending.  Chest x-ray showing pulmonary edema.  ED provider discussed the case with Dr. Hollie Salk from nephrology who will put him on the list for dialysis in the morning.  Patient was given [facility] for hyperkalemia.  11/20/20: Seen and examined at his bedside at the hemodialysis center.  Reports his breathing is improving with hemodialysis.  Assessment/Plan: Principal Problem:   Volume overload Active Problems:   ESRD on hemodialysis (HCC)   Acute hypoxemic respiratory failure (HCC)   CAD  (coronary artery disease)   PAF (paroxysmal atrial fibrillation) (HCC)  Acute hypoxic respiratory failure secondary to volume overload in the setting of ESRD on HD and chronic combined systolic and diastolic CHF. His last hemodialysis session was the day prior to his presentation. Presented with O2 saturation 88% on room air.  Requiring 4 L to maintain O2 saturation greater than 92%. Evidence of pulmonary edema on chest x-ray. Nephrology following, HD on 11/20/2020, plan to remove 3 L. Impression nephrology assistance Continue to maintain O2 saturation greater than 90%.  Volume overload Volume status addressed with hemodialysis. Continue to monitor volume status  Chest pain in the setting of known multivessel coronary artery disease Seen by cardiology Continue home Imdur and metoprolol  Coronary artery disease Continue aspirin, Plavix, Lipitor, Imdur and metoprolol. Continue to monitor on telemetry No evidence of acute ischemic changes on twelve-lead EKG.  Essential hypertension Initially on presentation BP was not controlled Improving back on his home medications and with hemodialysis. Continue to closely monitor vital signs, avoid hypotension.  Resolved mild hyperkalemia Presented with serum potassium of 5.4 Received Lokelma Electrolytes addressed with hemodialysis.  Paroxysmal A. fib not on oral anticoagulation Per medical records, normal anticoagulation due to noncompliance. On metoprolol for rate control.  Elevated D-dimer Bilateral lower extremity Doppler ultrasound has been ordered to rule out DVT, follow results.    Code Status: Full code  Family Communication: None at bedside.  Disposition Plan: Likely will discharge to home once symptomatology has improved.   Consultants:  Nephrology  Procedures:  Hemodialysis on 11/20/2020  Antimicrobials:  None.  DVT prophylaxis: Subcu heparin 3 times daily.    Objective: Vitals:   11/20/20 1110 11/20/20  1130 11/20/20 1135 11/20/20 1140  BP: 119/84 129/60  (!) 122/56  Pulse:    (!) 50  Resp:      Temp:    (!) 97.4 F (36.3 C)  TempSrc:    Oral  SpO2:   (!) 88% 97%  Weight:    86.6 kg  Height:        Intake/Output Summary (Last 24 hours) at 11/20/2020 1430 Last data filed at 11/20/2020 1131 Gross per 24 hour  Intake --  Output 2500 ml  Net -2500 ml   Filed Weights   11/19/20 1934 11/20/20 1140  Weight: 90.7 kg 86.6 kg    Exam:  . General: 65 y.o. year-old male well developed well nourished in no acute distress.  Alert and oriented x3. . Cardiovascular: Regular rate and rhythm with no rubs or gallops.  No thyromegaly or JVD noted.   Marland Kitchen Respiratory: Mild rales at bases.  No wheezing noted.  Poor inspiratory effort.   . Abdomen: Soft nontender nondistended with normal bowel sounds x4 quadrants. . Musculoskeletal: Trace lower extremity edema bilaterally.  Marland Kitchen Psychiatry: Mood is appropriate for condition and setting   Data Reviewed: CBC: Recent Labs  Lab 11/19/20 1934 11/20/20 0901  WBC 9.4 8.6  NEUTROABS 6.3  --   HGB 11.5* 9.9*  HCT 35.4* 29.6*  MCV 96.7 94.9  PLT 282 [id number]   Basic Metabolic Panel: Recent Labs  Lab 11/19/20 1934 11/20/20 0814  NA 140 139  K 5.4* 5.0  CL 99 100  CO2 23 21*  GLUCOSE 81 87  BUN 65* 71*  CREATININE 12.24* 13.54*  CALCIUM 9.1 8.6*  PHOS  --  6.3*   GFR: Estimated Creatinine Clearance: 5.7 mL/min (A) (by C-G formula based on SCr of 13.54 mg/dL (H)). Liver Function Tests: Recent Labs  Lab 11/19/20 1934 11/20/20 0814  AST 14*  --   ALT 10  --   ALKPHOS 60  --   BILITOT 0.8  --   PROT 8.1  --   ALBUMIN 3.1* 2.9*   No results for input(s): LIPASE, AMYLASE in the last 168 hours. No results for input(s): AMMONIA in the last 168 hours. Coagulation Profile: No results for input(s): INR, PROTIME in the last 168 hours. Cardiac Enzymes: No results for input(s): CKTOTAL, CKMB, CKMBINDEX, TROPONINI in the last 168 hours. BNP (last  3 results) No results for input(s): PROBNP in the last 8760 hours. HbA1C: No results for input(s): HGBA1C in the last 72 hours. CBG: No results for input(s): GLUCAP in the last 168 hours. Lipid Profile: Recent Labs    11/19/20 1934  TRIG 90   Thyroid Function Tests: No results for input(s): TSH, T4TOTAL, FREET4, T3FREE, THYROIDAB in the last 72 hours. Anemia Panel: Recent Labs    11/19/20 1934  FERRITIN 979*   Urine analysis:    Component Value Date/Time   COLORURINE YELLOW 05/12/2016 0834   APPEARANCEUR CLOUDY (A) 05/12/2016 0834   LABSPEC 1.014 05/12/2016 0834   PHURINE 5.0 05/12/2016 0834   GLUCOSEU NEGATIVE 05/12/2016 0834   HGBUR MODERATE (A) 05/12/2016 0834   BILIRUBINUR NEGATIVE 05/12/2016 0834   KETONESUR NEGATIVE 05/12/2016 0834   PROTEINUR 100 (A) 05/12/2016 0834   UROBILINOGEN 0.2 12/09/2013 0013   NITRITE NEGATIVE 05/12/2016 0834   LEUKOCYTESUR NEGATIVE 05/12/2016 0834   Sepsis Labs: '@LABRCNTIP'$ (procalcitonin:4,lacticidven:4)  ) Recent Results (from the past  240 hour(s))  SARS Coronavirus 2 by RT PCR (hospital order, performed in [facility] hospital lab) Nasopharyngeal Nasopharyngeal Swab     Status: None   Collection Time: 11/19/20  7:36 PM   Specimen: Nasopharyngeal Swab  Result Value Ref Range Status   SARS Coronavirus 2 NEGATIVE NEGATIVE Final    Comment: (NOTE) SARS-CoV-2 target nucleic acids are NOT DETECTED.  The SARS-CoV-2 RNA is generally detectable in upper and lower respiratory specimens during the acute phase of infection. The lowest concentration of SARS-CoV-2 viral copies this assay can detect is 250 copies / mL. A negative result does not preclude SARS-CoV-2 infection and should not be used as the sole basis for treatment or other patient management decisions.  A negative result may occur with improper specimen collection / handling, submission of specimen other than nasopharyngeal swab, presence of viral mutation(s) within the areas  targeted by this assay, and inadequate number of viral copies (<250 copies / mL). A negative result must be combined with clinical observations, patient history, and epidemiological information.  Fact Sheet for Patients:   StrictlyIdeas.no  Fact Sheet for Healthcare Providers: BankingDealers.co.za  This test is not yet approved or  cleared by the [place] FDA and has been authorized for detection and/or diagnosis of SARS-CoV-2 by FDA under an Emergency Use Authorization (EUA).  This EUA will remain in effect (meaning this test can be used) for the duration of the COVID-19 declaration under Section 564(b)(1) of the Act, 21 U.S.C. section 360bbb-3(b)(1), unless the authorization is terminated or revoked sooner.  Performed at [facility], [place] [address] [place], [place] [address]       Studies: DG Chest Port 1 View  Result Date: 11/19/2020 CLINICAL DATA:  Short of breath, previous tobacco abuse, COPD EXAM: PORTABLE CHEST 1 VIEW COMPARISON:  10/26/2020 FINDINGS: Single frontal view of the chest demonstrates mild enlargement of the cardiac silhouette. There is diffuse increased interstitial prominence with bilateral ground-glass airspace disease. Trace right effusion is noted. No pneumothorax. IMPRESSION: 1. Findings most consistent with congestive heart failure. Electronically Signed   By: Randa Ngo M.D.   On: 11/19/2020 19:52    Scheduled Meds: . amLODipine  5 mg Oral Daily  . aspirin EC  81 mg Oral Daily  . atorvastatin  80 mg Oral QHS  . calcitRIOL  2.25 mcg Oral Q T,Th,Sa-HD  . Chlorhexidine Gluconate Cloth  6 each Topical Q0600  . cinacalcet  30 mg Oral Q T,Th,Sa-HD  . clopidogrel  75 mg Oral QPM  . feeding supplement (NEPRO CARB STEADY)  237 mL Oral TID BM  . heparin  5,000 Units Subcutaneous Q8H  . heparin sodium (porcine)      . isosorbide mononitrate  90 mg Oral Daily  . lanthanum  1,000 mg Oral TID WC  .  metoprolol succinate  25 mg Oral Daily  . pantoprazole  40 mg Oral Q0600    Continuous Infusions: . sodium chloride    . sodium chloride       LOS: 0 days     Kayleen Memos, MD [facility] Pager [phone]  If 7PM-7AM, please contact night-coverage [email or web address] Password [email or web address] 11/20/2020, 2:30 PM

## 2020-11-20 NOTE — Consult Note (Signed)
[facility] Renal Consultation Note    Indication for Consultation:  Management of ESRD/hemodialysis, anemia, hypertension/volume, and secondary hyperparathyroidism.  HPI: Kenneth [name] is a 65 y.o. male with PMH including ESRD on dialysis Tuesday, Saturday, CAD, CHF, with shortness of breath.  Reports he has actually been short of breath for about a month but symptoms worsened overnight. Reported orthopnea earlier but denies it at present. Denies LE edema. No current CP, palpitations, or dizziness, but he does report intermittent chest pain for "months" and has had several hospital admissions for CAD. O2 sat was 88% on RA in the ED. Systolic BP in the [id number]. Labs notable for K+ 5.4, Cr 12.24, BUN 65, Hgb 11.5. No recent fevers/chills and covid test is negative. CXR consistent with CHF exacerbation. Nephrology was consulted and patient is seen on dialysis this morning. Patient did attend dialysis on Saturday but treatment was short and he did not meet his EDW (pt is unsure why, reports he came on time and did not sign off early). He does admit that he does not follow any fluid restrictions at home.   Past Medical History:  Diagnosis Date  . Acute CHF ([place]) 01/2018  . Cardiomyopathy secondary    likely related to HTN heart disease; possibly ETOH related as well  . Chronic combined systolic and diastolic heart failure (HCC)    Echocardiogram 09/22/11: Moderate LVH, EF 99991111, grade 3 diastolic dysfunction, mild MR, moderate to severe LAE, mild RVE, mild to moderate TR, small to moderate pericardial effusion  . Coronary artery disease    not felt to be candidate for CABG 08/2018; medical thearpy recomended due to high risk of PCI   . Dysrhythmia    aflutter 04/2016, afib 02/2018, not felt to be a candidate for anticoagulation due to non-compliance and ETOH  . ESRD (end stage renal disease) on dialysis ([place])    due to hypertensive nephrosclerosis; TTS; [facility] [address] (09/01/2018)  . History  of alcohol abuse   . Hypertension   . Myocardial infarction [facility])    " mild " per daughter  . PEA (Pulseless electrical activity) ([place])    PEA arrest 03/09/18, treated empirically for hyperkalemia, shock x1 for WCT, given amiodarone, ROSC after 10 min of ACLS   Past Surgical History:  Procedure Laterality Date  . AV FISTULA PLACEMENT Left 05/14/2016   Procedure: LEFT ARM BASILIC VEIN TRANSPOSITION;  Surgeon: Rosetta Posner, MD;  Location: [place];  Service: Vascular;  Laterality: Left;  . INSERTION OF DIALYSIS CATHETER N/A 06/20/2019   Procedure: INSERTION OF TUNNELED  DIALYSIS CATHETER;  Surgeon: Elam Dutch, MD;  Location: [place];  Service: Vascular;  Laterality: N/A;  . LEFT HEART CATH AND CORONARY ANGIOGRAPHY N/A 09/02/2018   Procedure: LEFT HEART CATH AND CORONARY ANGIOGRAPHY;  Surgeon: Nelva Bush, MD;  Location: [place] CV LAB;  Service: Cardiovascular;  Laterality: N/A;  . LEFT HEART CATH AND CORONARY ANGIOGRAPHY N/A 05/14/2020   Procedure: LEFT HEART CATH AND CORONARY ANGIOGRAPHY;  Surgeon: Sherren Mocha, MD;  Location: [place] CV [facility];  Service: Cardiovascular;  Laterality: N/A;  . PERIPHERAL VASCULAR CATHETERIZATION N/A 05/13/2016   Procedure: Dialysis/Perma Catheter Insertion;  Surgeon: Serafina Mitchell, MD;  Location: [place] CV [facility];  Service: Cardiovascular;  Laterality: N/A;  . REVISION OF ARTERIOVENOUS GORETEX GRAFT Left 0000000   Procedure: PLICATION OF ARTERIOVENOUS FISTULA LEFT ARM;  Surgeon: Elam Dutch, MD;  Location: [place];  Service: Vascular;  Laterality: Left;   Family History  Problem Relation Age of Onset  . Emphysema Mother   . Cirrhosis Father    Social History:  reports that he quit smoking about 3 years ago. His smoking use included cigars. He has a 96.00 pack-year smoking history. He has quit using smokeless tobacco.  His smokeless tobacco use included chew. He reports previous alcohol use. He reports that he does not use drugs.  ROS:  As per HPI otherwise negative. Marland Kitchen  Physical Exam: Vitals:   11/20/20 0730 11/20/20 0738 11/20/20 0738 11/20/20 0800  BP: (!) 149/76   (!) 164/81  Pulse:  82  84  Resp:  20  (!) 24  Temp:   99 F (37.2 C)   TempSrc:   Oral   SpO2:  99%  98%  Weight:      Height:         General: Well developed, well nourished, in no acute distress. Head: Normocephalic, atraumatic, sclera non-icteric, mucus membranes are moist. Neck: JVD not elevated. Lungs: Respirations unlabored on O2 2L via [place]. + rales bilateral lower lobes.  Heart: RRR with normal S1, S2. No murmurs, rubs, or gallops appreciated. Abdomen: Soft, non-tender, non-distended with normoactive bowel sounds. No rebound/guarding. No obvious abdominal masses. Musculoskeletal:  Strength and tone appear normal for age. Lower extremities: No edema bilateral lower extremities Neuro: Alert and oriented X 3. Moves all extremities spontaneously. Psych:  Responds to questions appropriately with a normal affect. Dialysis Access: LUE AVF accessed  No Known Allergies Prior to Admission medications   Medication Sig Start Date End Date Taking? Authorizing Provider  amLODipine (NORVASC) 10 MG tablet Take 0.5 tablets (5 mg total) by mouth daily. 10/20/20  Yes Kathie Dike, MD  aspirin EC 81 MG EC tablet Take 1 tablet (81 mg total) by mouth daily. Swallow whole. 05/16/20  Yes Alma Friendly, MD  atorvastatin (LIPITOR) 80 MG tablet Take 1 tablet (80 mg total) by mouth daily. Patient taking differently: Take 80 mg by mouth at bedtime. 10/09/20  Yes Kilroy, Luke K, PA-C  B Complex-C-Folic Acid (RENA-VITE RX) 1 MG TABS Take 1 tablet by mouth daily. 10/05/20  Yes [provider]  clopidogrel (PLAVIX) 75 MG tablet Take 1 tablet (75 mg total) by mouth daily. Patient taking differently: Take 75 mg by mouth every evening. 10/09/20  Yes Kilroy, Luke K, PA-C  diphenhydramine-acetaminophen (TYLENOL PM EXTRA STRENGTH) 25-500 MG TABS tablet Take 2  tablets by mouth at bedtime as needed (pain). 05/12/18  Yes [provider]  famotidine (PEPCID) 20 MG tablet Take 1 tablet (20 mg total) by mouth 2 (two) times daily as needed for heartburn. 10/28/20  Yes Vashti Hey, MD  isosorbide mononitrate (IMDUR) 30 MG 24 hr tablet Take 3 tablets (90 mg total) by mouth daily. 10/29/20 11/28/20 Yes Vashti Hey, MD  lanthanum (FOSRENOL) 1000 MG chewable tablet Chew 1,000 mg by mouth 3 (three) times daily. 08/23/20  Yes [provider]  metoprolol succinate (TOPROL-XL) 25 MG 24 hr tablet Take 25 mg by mouth in the morning and at bedtime.   Yes [provider]  nitroGLYCERIN (NITROSTAT) 0.4 MG SL tablet Place 1 tablet (0.4 mg total) under the tongue every 5 (five) minutes as needed for chest pain (max 3 doses). 10/13/20  Yes Truddie Hidden, MD  pantoprazole (PROTONIX) 40 MG tablet Take 1 tablet (40 mg total) by mouth daily at 6 (six) AM. 10/29/20 11/28/20 Yes Vashti Hey, MD  VITAMIN D PO Take 1 tablet by mouth  daily. 11/15/20 11/14/21 Yes [provider]   Current Facility-Administered Medications  Medication Dose Route Frequency Provider Last Rate Last Admin  . 0.9 %  sodium chloride infusion  100 mL Intravenous PRN Shela Leff, MD      . 0.9 %  sodium chloride infusion  100 mL Intravenous PRN Shela Leff, MD      . acetaminophen (TYLENOL) tablet 650 mg  650 mg Oral Q6H PRN Shela Leff, MD   650 mg at 11/20/20 0320   Or  . acetaminophen (TYLENOL) suppository 650 mg  650 mg Rectal Q6H PRN Shela Leff, MD      . alteplase (CATHFLO ACTIVASE) injection 2 mg  2 mg Intracatheter Once PRN Madelon Lips, MD      . amLODipine (NORVASC) tablet 5 mg  5 mg Oral Daily Shela Leff, MD      . aspirin EC tablet 81 mg  81 mg Oral Daily Shela Leff, MD      . atorvastatin (LIPITOR) tablet 80 mg  80 mg Oral QHS Shela Leff, MD   80 mg at 11/20/20 0043  .  calcitRIOL (ROCALTROL) capsule 2.25 mcg  2.25 mcg Oral Q T,Th,Sa-HD Shela Leff, MD      . Chlorhexidine Gluconate Cloth 2 % PADS 6 each  6 each Topical Q0600 Shela Leff, MD      . cinacalcet (SENSIPAR) tablet 30 mg  30 mg Oral Q T,Th,Sa-HD Shela Leff, MD      . clopidogrel (PLAVIX) tablet 75 mg  75 mg Oral QPM Shela Leff, MD      . heparin injection 1,000 Units  1,000 Units Dialysis PRN Madelon Lips, MD      . heparin injection 2,400 Units  2,400 Units Dialysis PRN Shela Leff, MD      . heparin injection 5,000 Units  5,000 Units Subcutaneous Q8H Shela Leff, MD   5,000 Units at 11/20/20 0654  . hydrALAZINE (APRESOLINE) injection 5 mg  5 mg Intravenous Q4H PRN Shela Leff, MD      . isosorbide mononitrate (IMDUR) 24 hr tablet 90 mg  90 mg Oral Daily Shela Leff, MD      . lanthanum [facility]) chewable tablet 1,000 mg  1,000 mg Oral TID WC Shela Leff, MD      . lidocaine (PF) (XYLOCAINE) 1 % injection 5 mL  5 mL Intradermal PRN Shela Leff, MD      . lidocaine-prilocaine (EMLA) cream 1 application  1 application Topical PRN Shela Leff, MD      . metoprolol succinate (TOPROL-XL) 24 hr tablet 25 mg  25 mg Oral Daily Shela Leff, MD      . pantoprazole (PROTONIX) EC tablet 40 mg  40 mg Oral Q0600 Shela Leff, MD   40 mg at 11/20/20 0654  . pentafluoroprop-tetrafluoroeth (GEBAUERS) aerosol 1 application  1 application Topical PRN Shela Leff, MD       Labs: Basic Metabolic Panel: Recent Labs  Lab 11/19/20 1934  NA 140  K 5.4*  CL 99  CO2 23  GLUCOSE 81  BUN 65*  CREATININE 12.24*  CALCIUM 9.1   Liver Function Tests: Recent Labs  Lab 11/19/20 1934  AST 14*  ALT 10  ALKPHOS 60  BILITOT 0.8  PROT 8.1  ALBUMIN 3.1*   CBC: Recent Labs  Lab 11/19/20 1934  WBC 9.4  NEUTROABS 6.3  HGB 11.5*  HCT 35.4*  MCV 96.7  PLT 282   Iron Studies:  Recent Labs    11/19/20 1934  FERRITIN 979*   Studies/Results: DG Chest Port 1 View  Result Date: 11/19/2020 CLINICAL DATA:  Short of breath, previous tobacco abuse, COPD EXAM: PORTABLE CHEST 1 VIEW COMPARISON:  10/26/2020 FINDINGS: Single frontal view of the chest demonstrates mild enlargement of the cardiac silhouette. There is diffuse increased interstitial prominence with bilateral ground-glass airspace disease. Trace right effusion is noted. No pneumothorax. IMPRESSION: 1. Findings most consistent with congestive heart failure. Electronically Signed   By: Randa Ngo M.D.   On: 11/19/2020 19:52    Dialysis Orders: Center: [facility]. TueThuSat, 4 hrs 0 min, 180NRe Optiflux, BFR 400, DFR Manual 800 mL/min, EDW 82.5 (kg), Dialysate 2.0 K, 2.0 Ca Heparin 2400 unit bolus sensipar 30 mg q HD mircera 75 mcg q 2 weeks- last dose 1/13 Calcitriol 2.5 mcg PO q HD  Assessment/Plan: 1.  Volume overload: CXR consistent with volume overload. Pt has not been meeting his EDW or following fluid restrictions. UF goal 3L today as tolerated. We discussed the importance of fluid restrictions especially in light of his CAD 2.  ESRD:  Dialyzes TTS, volume overload as above. Urgent HD today, continue TTS schedule.  3.  Hypertension/volume: Resume home BP meds. UF with HD as tolerated.  4.  Anemia: Hgb at goal. Not due for ESA yet and may need decreased dose.  5.  Metabolic bone disease: Resume VDRA, sensipar and binders, follow phos/Ca.  6.  Nutrition:  Discussed renal diet and fluid restrictions. Albumin low, will start a protein supplement.  7. Severe multivessel CAD: Recurrent hospitalizations for the same. Cardiology recommended medical management, not a candidate for CABG or PCI. No chest pain at present.   Anice Paganini, PA-C 11/20/2020, 8:49 AM  [facility] Pager: [phone]

## 2020-11-20 NOTE — Progress Notes (Signed)
Hemodialysis- Treatment completed. UF 2.5L without issue. Trialed off 02, sats dropping to upper 80s. Currently 98% on 2L [place]. Denies pain. Reported off to ED RN. Vitals stable. BP 122/56, HR 105, RR 18.

## 2020-11-20 NOTE — ED Notes (Signed)
Pt is refusing to keep leads on. Pt told many times he needs to have them on. States they make his chest hurt.

## 2020-11-20 NOTE — Procedures (Signed)
   I was present at this dialysis session, have reviewed the session itself and made  appropriate changes Kelly Splinter MD [place] pager [phone]   11/20/2020, 10:08 AM

## 2020-11-20 NOTE — Progress Notes (Signed)
Bilateral lower extremity venous duplex has been completed. Preliminary results can be found in CV Proc through chart review.   11/20/20 3:03 PM Kenneth [name] RVT

## 2020-11-20 NOTE — Consult Note (Signed)
Cardiology Consultation:   Patient ID: Kenneth [name] MRN: [id number]; DOB: [birth year]  Admit date: 11/19/2020 Date of Consult: 11/20/2020  Primary Care Provider: [name], MD [facility] Cardiologist: Peter [name], MD  [facility] Electrophysiologist:  None     History of Present Illness:   Kenneth [name] is a 65 yo male with history of multi-vessel CAD, chronic systolic and diastolic CHF, paroxysmal atrial fibrillation, previous PEA arrest, heavy etoh use, atrial fibrillation/fluter, ESRD on HD, HTN and NSVT who we are asked to see in consultation today by Dr. Marlowe Sax for evaluation of chest pain and dyspnea. He is known to have severe multi-vessel CAD by cardiac cath in July 2021 and is not felt to have options for PCI or CABG, in part due to his history of non-compliance. Cardiac cath in July 2021 with severe calcific left main stenosis, severe ostial/proximal LAD stenosis, severe proximal Circumflex stenosis and chronic occlusion of the RCA with left to right collaterals. Echo July 2021 with LVEF=55-60%. Moderate mitral valve regurgitation. He is ESRD on HD. He has paroxysmal atrial fibrillation but is not on anti-coagulation due to compliance issues. He was readmitted to [facility] twice in December 2021 with chest pain. Palliative care consultation was recommended as there were no options for invasive management of his CAD. He has been continued on Imdur. He is a DNI. Most recent admission October 29, 2020 with atypical chest pain.   He is now admitted with dyspnea. He has had some chest pain. Troponin [id number]. EKG with sinus, LVH, poor R wave progression-unchanged from old EKG. He notes that his dyspnea is improved after dialysis. He has no LE edema, palpitations, near syncope or syncope.    Past Medical History:  Diagnosis Date  . Acute CHF ([place]) 01/2018  . Cardiomyopathy secondary    likely related to HTN heart disease; possibly ETOH related as well  . Chronic combined  systolic and diastolic heart failure (HCC)    Echocardiogram 09/22/11: Moderate LVH, EF [id number], grade 3 diastolic dysfunction, mild MR, moderate to severe LAE, mild RVE, mild to moderate TR, small to moderate pericardial effusion  . Coronary artery disease    not felt to be candidate for CABG 08/2018; medical thearpy recomended due to high risk of PCI   . Dysrhythmia    aflutter 04/2016, afib 02/2018, not felt to be a candidate for anticoagulation due to non-compliance and ETOH  . ESRD (end stage renal disease) on dialysis ([place])    due to hypertensive nephrosclerosis; TTS; [address] (09/01/2018)  . History of alcohol abuse   . Hypertension   . Myocardial infarction [facility])    " mild " per daughter  . PEA (Pulseless electrical activity) ([place])    PEA arrest 03/09/18, treated empirically for hyperkalemia, shock x1 for WCT, given amiodarone, ROSC after 10 min of ACLS    Past Surgical History:  Procedure Laterality Date  . AV FISTULA PLACEMENT Left 05/14/2016   Procedure: LEFT ARM BASILIC VEIN TRANSPOSITION;  Surgeon: Rosetta Posner, MD;  Location: [place];  Service: Vascular;  Laterality: Left;  . INSERTION OF DIALYSIS CATHETER N/A 06/20/2019   Procedure: INSERTION OF TUNNELED  DIALYSIS CATHETER;  Surgeon: Elam Dutch, MD;  Location: [place];  Service: Vascular;  Laterality: N/A;  . LEFT HEART CATH AND CORONARY ANGIOGRAPHY N/A 09/02/2018   Procedure: LEFT HEART CATH AND CORONARY ANGIOGRAPHY;  Surgeon: Nelva Bush, MD;  Location: [place] CV LAB;  Service: Cardiovascular;  Laterality: N/A;  . LEFT HEART  CATH AND CORONARY ANGIOGRAPHY N/A 05/14/2020   Procedure: LEFT HEART CATH AND CORONARY ANGIOGRAPHY;  Surgeon: Sherren Mocha, MD;  Location: [place] CV LAB;  Service: Cardiovascular;  Laterality: N/A;  . PERIPHERAL VASCULAR CATHETERIZATION N/A 05/13/2016   Procedure: Dialysis/Perma Catheter Insertion;  Surgeon: Serafina Mitchell, MD;  Location: [place] CV LAB;  Service: Cardiovascular;   Laterality: N/A;  . REVISION OF ARTERIOVENOUS GORETEX GRAFT Left 0000000   Procedure: PLICATION OF ARTERIOVENOUS FISTULA LEFT ARM;  Surgeon: Elam Dutch, MD;  Location: [facility] OR;  Service: Vascular;  Laterality: Left;     Home Medications:  Prior to Admission medications   Medication Sig Start Date End Date Taking? Authorizing Provider  amLODipine (NORVASC) 10 MG tablet Take 0.5 tablets (5 mg total) by mouth daily. 10/20/20  Yes Kathie Dike, MD  aspirin EC 81 MG EC tablet Take 1 tablet (81 mg total) by mouth daily. Swallow whole. 05/16/20  Yes Alma Friendly, MD  atorvastatin (LIPITOR) 80 MG tablet Take 1 tablet (80 mg total) by mouth daily. Patient taking differently: Take 80 mg by mouth at bedtime. 10/09/20  Yes Kilroy, Luke K, PA-C  B Complex-C-Folic Acid (RENA-VITE RX) 1 MG TABS Take 1 tablet by mouth daily. 10/05/20  Yes [provider]  clopidogrel (PLAVIX) 75 MG tablet Take 1 tablet (75 mg total) by mouth daily. Patient taking differently: Take 75 mg by mouth every evening. 10/09/20  Yes Kilroy, Luke K, PA-C  diphenhydramine-acetaminophen (TYLENOL PM EXTRA STRENGTH) 25-500 MG TABS tablet Take 2 tablets by mouth at bedtime as needed (pain). 05/12/18  Yes [provider]  famotidine (PEPCID) 20 MG tablet Take 1 tablet (20 mg total) by mouth 2 (two) times daily as needed for heartburn. 10/28/20  Yes Vashti Hey, MD  isosorbide mononitrate (IMDUR) 30 MG 24 hr tablet Take 3 tablets (90 mg total) by mouth daily. 10/29/20 11/28/20 Yes Vashti Hey, MD  lanthanum (FOSRENOL) 1000 MG chewable tablet Chew 1,000 mg by mouth 3 (three) times daily. 08/23/20  Yes [provider]  metoprolol succinate (TOPROL-XL) 25 MG 24 hr tablet Take 25 mg by mouth in the morning and at bedtime.   Yes [provider]  nitroGLYCERIN (NITROSTAT) 0.4 MG SL tablet Place 1 tablet (0.4 mg total) under the tongue every 5 (five) minutes as needed for chest  pain (max 3 doses). 10/13/20  Yes Truddie Hidden, MD  pantoprazole (PROTONIX) 40 MG tablet Take 1 tablet (40 mg total) by mouth daily at 6 (six) AM. 10/29/20 11/28/20 Yes Vashti Hey, MD  VITAMIN D PO Take 1 tablet by mouth daily. 11/15/20 11/14/21 Yes [provider]    Inpatient Medications: Scheduled Meds: . amLODipine  5 mg Oral Daily  . aspirin EC  81 mg Oral Daily  . atorvastatin  80 mg Oral QHS  . calcitRIOL  2.25 mcg Oral Q T,Th,Sa-HD  . Chlorhexidine Gluconate Cloth  6 each Topical Q0600  . cinacalcet  30 mg Oral Q T,Th,Sa-HD  . clopidogrel  75 mg Oral QPM  . feeding supplement (NEPRO CARB STEADY)  237 mL Oral TID BM  . heparin  5,000 Units Subcutaneous Q8H  . heparin sodium (porcine)      . isosorbide mononitrate  90 mg Oral Daily  . lanthanum  1,000 mg Oral TID WC  . metoprolol succinate  25 mg Oral Daily  . pantoprazole  40 mg Oral Q0600   Continuous Infusions: . sodium chloride    .  sodium chloride     PRN Meds: sodium chloride, sodium chloride, acetaminophen **OR** acetaminophen, heparin, hydrALAZINE, lidocaine (PF), lidocaine-prilocaine, pentafluoroprop-tetrafluoroeth  Allergies:   No Known Allergies  Social History:   Social History   Socioeconomic History  . Marital status: Divorced    Spouse name: Not on file  . Number of children: Not on file  . Years of education: Not on file  . Highest education level: Not on file  Occupational History  . Occupation: retired  Tobacco Use  . Smoking status: Former Smoker    Packs/day: 2.00    Years: 48.00    Pack years: 96.00    Types: Cigars    Quit date: 10/27/2017    Years since quitting: 3.0  . Smokeless tobacco: Former Systems developer    Types: Secondary school teacher  . Vaping Use: Never used  Substance and Sexual Activity  . Alcohol use: Not Currently    Comment: h/o very heavy use, quit about 2019  . Drug use: No  . Sexual activity: Not Currently  Other Topics Concern  . Not on file  Social  History Narrative  . Not on file   Social Determinants of Health   Financial Resource Strain: Not on file  Food Insecurity: Not on file  Transportation Needs: Not on file  Physical Activity: Not on file  Stress: Not on file  Social Connections: Not on file  Intimate Partner Violence: Not on file    Family History:    Family History  Problem Relation Age of Onset  . Emphysema Mother   . Cirrhosis Father      ROS:  Please see the history of present illness.  All other ROS reviewed and negative.     Physical Exam/Data:   Vitals:   11/20/20 1110 11/20/20 1130 11/20/20 1135 11/20/20 1140  BP: 119/84 129/60  (!) 122/56  Pulse:    (!) 50  Resp:      Temp:    (!) 97.4 F (36.3 C)  TempSrc:    Oral  SpO2:   (!) 88% 97%  Weight:    86.6 kg  Height:        Intake/Output Summary (Last 24 hours) at 11/20/2020 1316 Last data filed at 11/20/2020 1131 Gross per 24 hour  Intake --  Output 2500 ml  Net -2500 ml   Last 3 Weights 11/20/2020 11/20/2020 11/19/2020  Weight (lbs) 190 lb 14.7 oz (No Data) 200 lb  Weight (kg) 86.6 kg (No Data) 90.719 kg     Body mass index is 26.63 kg/m.  General:  Well nourished, well developed, in no acute distress HEENT: normal Lymph: no adenopathy Neck: no JVD Endocrine:  No thryomegaly Vascular: No carotid bruits; FA pulses 2+ bilaterally without bruits  Cardiac:  normal S1, S2; RRR; no murmur  Lungs:  clear to auscultation bilaterally, no wheezing, rhonchi or rales  Abd: soft, nontender, no hepatomegaly  Ext: no edema Musculoskeletal:  No deformities, BUE and BLE strength normal and equal Skin: warm and dry  Neuro:  CNs 2-12 intact, no focal abnormalities noted Psych:  Normal affect   EKG:  The EKG was personally reviewed and demonstrates:  sinus, LVH, poor R wave progression-unchanged from old EKG.  Relevant CV Studies:  Echo July 2021: 1. Left ventricular ejection fraction, by estimation, is 55 to 60%. The  left ventricle has  normal function. The left ventricle has no regional  wall motion abnormalities. There is moderate left ventricular hypertrophy.  Left ventricular diastolic  parameters are consistent with Grade I diastolic dysfunction (impaired  relaxation).  2. Right ventricular systolic function is normal. The right ventricular  size is normal. There is normal pulmonary artery systolic pressure.  3. Left atrial size was mildly dilated.  4. The mitral valve is abnormal. Moderate mitral valve regurgitation.  5. The aortic valve is tricuspid. Aortic valve regurgitation is not  visualized. Mild aortic valve sclerosis is present, with no evidence of  aortic valve stenosis.   Cardiac cath July 2021: Severe multivessel coronary artery disease with severe calcific distal left main stenosis, severe diffuse ostial/proximal LAD stenosis, severe stenosis of the proximal left circumflex/first OM, and interval occlusion of the distal RCA with left-to-right collaterals 2.  Heavily calcified distal abdominal aorta and bilateral iliac arteries with mild diffuse calcified disease.  No severe iliac lesions identified.  Recommend ongoing medical therapy.  With total occlusion of the RCA and severe calcification of the distal left main and complex disease in the LAD and left circumflex, I think risk of protected PCI would be extremely high.  I also think his access is marginal for large bore support devices from the groin.   Laboratory Data:  High Sensitivity Troponin:   Recent Labs  Lab 10/27/20 0051 10/27/20 0714 11/20/20 0446 11/20/20 0814 11/20/20 0901  TROPONINIHS 53* 76* 133* 181* 169*     Chemistry Recent Labs  Lab 11/19/20 1934 11/20/20 0814  NA 140 139  K 5.4* 5.0  CL 99 100  CO2 23 21*  GLUCOSE 81 87  BUN 65* 71*  CREATININE 12.24* 13.54*  CALCIUM 9.1 8.6*  GFRNONAA 4* 4*  ANIONGAP 18* 18*    Recent Labs  Lab 11/19/20 1934 11/20/20 0814  PROT 8.1  --   ALBUMIN 3.1* 2.9*  AST 14*  --    ALT 10  --   ALKPHOS 60  --   BILITOT 0.8  --    Hematology Recent Labs  Lab 11/19/20 1934 11/20/20 0901  WBC 9.4 8.6  RBC 3.66* 3.12*  HGB 11.5* 9.9*  HCT 35.4* 29.6*  MCV 96.7 94.9  MCH 31.4 31.7  MCHC 32.5 33.4  RDW 17.5* 17.3*  PLT 282 244   BNPNo results for input(s): BNP, PROBNP in the last 168 hours.  DDimer  Recent Labs  Lab 11/19/20 1934  DDIMER 1.39*     Radiology/Studies:  DG Chest Port 1 View  Result Date: 11/19/2020 CLINICAL DATA:  Short of breath, previous tobacco abuse, COPD EXAM: PORTABLE CHEST 1 VIEW COMPARISON:  10/26/2020 FINDINGS: Single frontal view of the chest demonstrates mild enlargement of the cardiac silhouette. There is diffuse increased interstitial prominence with bilateral ground-glass airspace disease. Trace right effusion is noted. No pneumothorax. IMPRESSION: 1. Findings most consistent with congestive heart failure. Electronically Signed   By: Randa Ngo M.D.   On: 11/19/2020 19:52     Assessment and Plan:   1. CAD with chronic chest pain and dyspnea: He has minimal troponin elevation in setting of ESRD with flat trend. No ischemic changes on EKG. As outlined in the cardiology consultation notes from December 2021 and January 2022, he is not a candidate for PCI or CABG. He does have severe multi-vessel CAD. It is not clear that his chest pain is cardiac related in absence of objective evidence of ischemia. His dyspnea appears to be related to his fluid status and is better following HD.  -Not a candidate for invasive cardiac testing -Continue current therapy which includes ASA, Plavix, statin, beta  blocker, Imdur and Norvasc. I do not think increasing his Imdur would be of any benefit at this time.   We will sign off. He can keep his planned f/u with Dr. [name] in our [facility] office.   Risk Assessment/Risk Scores:  { For questions or updates, please contact [place] Please consult [email or web address] for contact info under     Signed, Lauree Chandler, MD  11/20/2020 1:16 PM

## 2020-11-20 NOTE — ED Notes (Signed)
Date and time results received: 11/20/20 0650 (use smartphrase ".now" to insert current time)  Test: Troponin Critical Value: 133  Name of Provider Notified: Marlowe Sax, MD (paged)  Orders Received? Or Actions Taken?:

## 2020-11-20 NOTE — ED Notes (Signed)
Paged [place], MD about pt complain of chest pain. MD states to give tylenol and draw troponin. MD made aware that pt is an extremely hard stick and has been stuck multiple times by multiple people.

## 2020-11-21 ENCOUNTER — Encounter (HOSPITAL_COMMUNITY): Payer: Self-pay | Admitting: Internal Medicine

## 2020-11-21 DIAGNOSIS — I25119 Atherosclerotic heart disease of native coronary artery with unspecified angina pectoris: Secondary | ICD-10-CM

## 2020-11-21 DIAGNOSIS — E8770 Fluid overload, unspecified: Secondary | ICD-10-CM

## 2020-11-21 DIAGNOSIS — J9601 Acute respiratory failure with hypoxia: Secondary | ICD-10-CM

## 2020-11-21 LAB — BASIC METABOLIC PANEL
Anion gap: 15 (ref 5–15)
BUN: 45 mg/dL — ABNORMAL HIGH (ref 8–23)
CO2: 27 mmol/L (ref 22–32)
Calcium: 8.1 mg/dL — ABNORMAL LOW (ref 8.9–10.3)
Chloride: 97 mmol/L — ABNORMAL LOW (ref 98–111)
Creatinine, Ser: 10.24 mg/dL — ABNORMAL HIGH (ref 0.61–1.24)
GFR, Estimated: 5 mL/min — ABNORMAL LOW (ref >60)
Glucose, Bld: 114 mg/dL — ABNORMAL HIGH (ref 70–99)
Potassium: 4.1 mmol/L (ref 3.5–5.1)
Sodium: 139 mmol/L (ref 135–145)

## 2020-11-21 LAB — CBC
HCT: 30 % — ABNORMAL LOW (ref 39.0–52.0)
Hemoglobin: 10.4 g/dL — ABNORMAL LOW (ref 13.0–17.0)
MCH: 32.5 pg (ref 26.0–34.0)
MCHC: 34.7 g/dL (ref 30.0–36.0)
MCV: 93.8 fL (ref 80.0–100.0)
Platelets: 222 10*3/uL (ref 150–400)
RBC: 3.2 MIL/uL — ABNORMAL LOW (ref 4.22–5.81)
RDW: 17.5 % — ABNORMAL HIGH (ref 11.5–15.5)
WBC: 6.4 10*3/uL (ref 4.0–10.5)
nRBC: 0 % (ref 0.0–0.2)

## 2020-11-21 MED ORDER — CHLORHEXIDINE GLUCONATE CLOTH 2 % EX PADS
6.0000 | MEDICATED_PAD | Freq: Every day | CUTANEOUS | Status: DC
  Administered 2020-11-21 – 2020-11-22 (×2): 6 via TOPICAL

## 2020-11-21 MED ORDER — HEPARIN SODIUM (PORCINE) 1000 UNIT/ML DIALYSIS: 20.0000 [IU]/kg | INTRAMUSCULAR | Status: DC | PRN

## 2020-11-21 NOTE — Progress Notes (Addendum)
PROGRESS NOTE  Kenneth [name] [name] DOB: 1956/05/08 DOA: 11/19/2020 PCP: [name], MD  HPI/Recap of past 24 hours:  Kenneth [name] a 65 y.o.malewith medical history significant ofmultivessel CAD-not felt to be a good candidate for PCI/CABG and medically managed,PEA arrest in 2019, chronic combined systolic and diastolicheart failure,paroxysmal A. fib,ESRD on HD TTS, hypertensionpresenting to the ED via EMS for evaluation of shortness of breath. SPO2 91% on room air with EMS, placed on nonrebreather.Patient is a poor historian. Reports feeling short of breath for the past 2 months. Also endorsing orthopnea. Denies lower extremity edema. States for the past 7 months he is having throbbing left-sided chest pain which comes and goes. Today his chest pain started again in the morning at rest. Denies fevers or cough. States he is vaccinated against Covid including booster shot.    Subjective: November 21, 2020: Patient seen and examined at bedside he said he had hemodialysis yesterday he feels better he had missed a couple of the hemodialysis but he feels better today  Assessment/Plan: Principal Problem:   Volume overload Active Problems:   ESRD on hemodialysis ([place])   Acute hypoxemic respiratory failure (HCC)   CAD (coronary artery disease)   PAF (paroxysmal atrial fibrillation) (HCC)  Acute hypoxic respiratory failure secondary to volume overload in the setting of ESRD on HD and chronic combined systolic and diastolic CHF. His last hemodialysis session was the day prior to his presentation. Presented with O2 saturation 88% on room air.  Requiring 4 L to maintain O2 saturation greater than 92%. Evidence of pulmonary edema on chest x-ray. Nephrology following, HD on 11/20/2020, plan to remove 3 L. Impression nephrology assistance Continue to maintain O2 saturation greater than 90%.  Volume overload Volume status addressed with hemodialysis. Continue to  monitor volume status  Chest pain in the setting of known multivessel coronary artery disease Seen by cardiology Continue home Imdur and metoprolol  Coronary artery disease Continue aspirin, Plavix, Lipitor, Imdur and metoprolol. Continue to monitor on telemetry No evidence of acute ischemic changes on twelve-lead EKG.  Essential hypertension Initially on presentation BP was not controlled Improving back on his home medications and with hemodialysis. Continue to closely monitor vital signs, avoid hypotension.  Resolved mild hyperkalemia Presented with serum potassium of 5.4 Received Lokelma Electrolytes addressed with hemodialysis.  Paroxysmal A. fib not on oral anticoagulation Per medical records, normal anticoagulation due to noncompliance. On metoprolol for rate control.  Elevated D-dimer Bilateral lower extremity Doppler ultrasound has been ordered to rule out DVT, follow results.    Code Status: Full  Severity of Illness: The appropriate patient status for this patient is INPATIENT. Inpatient status is judged to be reasonable and necessary in order to provide the required intensity of service to ensure the patient's safety. The patient's presenting symptoms, physical exam findings, and initial radiographic and laboratory data in the context of their chronic comorbidities is felt to place them at high risk for further clinical deterioration. Furthermore, it is not anticipated that the patient will be medically stable for discharge from the hospital within 2 midnights of admission. The following factors support the patient status of inpatient.   " The patient's presenting symptoms include respiratory distress " The worrisome physical exam findings include overload " The initial radiographic and laboratory data are worrisome because of volume overload requiring hemodialysis. " The chronic co-morbidities include end-stage renal disease on hemodialysis.   * I certify  that at the point of admission it is my clinical  judgment that the patient will require inpatient hospital care spanning beyond 2 midnights from the point of admission due to high intensity of service, high risk for further deterioration and high frequency of surveillance required.*    Family Communication: None at bedside  Disposition Plan: Home when stable   Consultants:  Nephrology  Procedures:  Hemodialysis November 20, 2020  Antimicrobials:  None  DVT prophylaxis: Subcutaneous heparin 3 times daily   Objective: Vitals:   11/20/20 2148 11/20/20 2236 11/21/20 0204 11/21/20 0626  BP: (!) 160/88 (!) 148/83 (!) 152/80 (!) 148/79  Pulse: 93 87 97 98  Resp: '18 19 18 18  '$ Temp: 98.3 F (36.8 C) 98.3 F (36.8 C) 98 F (36.7 C) 98.3 F (36.8 C)  TempSrc: Oral Oral Oral Oral  SpO2: 93% 99% 99% 100%  Weight:      Height:        Intake/Output Summary (Last 24 hours) at 11/21/2020 0959 Last data filed at 11/20/2020 1131 Gross per 24 hour  Intake -  Output 2500 ml  Net -2500 ml   Filed Weights   11/19/20 1934 11/20/20 1140  Weight: 90.7 kg 86.6 kg   Body mass index is 26.63 kg/m.  Exam:  . General: 65 y.o. year-old male well developed well nourished in no acute distress.  Alert and oriented x3. . Cardiovascular: Regular rate and rhythm with no rubs or gallops.  No thyromegaly or JVD noted.   Marland Kitchen Respiratory: Clear to auscultation with no wheezes or rales. Good inspiratory effort. . Abdomen: Soft nontender nondistended with normal bowel sounds x4 quadrants. . Musculoskeletal: No lower extremity edema. 2/4 pulses in all 4 extremities. . Skin: No ulcerative lesions noted or rashes, . Psychiatry: Mood is appropriate for condition and setting    Data Reviewed: CBC: Recent Labs  Lab 11/19/20 1934 11/20/20 0901 11/21/20 0101  WBC 9.4 8.6 6.4  NEUTROABS 6.3  --   --   HGB 11.5* 9.9* 10.4*  HCT 35.4* 29.6* 30.0*  MCV 96.7 94.9 93.8  PLT 282 244 [id number]   Basic  Metabolic Panel: Recent Labs  Lab 11/19/20 1934 11/20/20 0814 11/21/20 0101  NA 140 139 139  K 5.4* 5.0 4.1  CL 99 100 97*  CO2 23 21* 27  GLUCOSE 81 87 114*  BUN 65* 71* 45*  CREATININE 12.24* 13.54* 10.24*  CALCIUM 9.1 8.6* 8.1*  PHOS  --  6.3*  --    GFR: Estimated Creatinine Clearance: 7.6 mL/min (A) (by C-G formula based on SCr of 10.24 mg/dL (H)). Liver Function Tests: Recent Labs  Lab 11/19/20 1934 11/20/20 0814  AST 14*  --   ALT 10  --   ALKPHOS 60  --   BILITOT 0.8  --   PROT 8.1  --   ALBUMIN 3.1* 2.9*   No results for input(s): LIPASE, AMYLASE in the last 168 hours. No results for input(s): AMMONIA in the last 168 hours. Coagulation Profile: No results for input(s): INR, PROTIME in the last 168 hours. Cardiac Enzymes: No results for input(s): CKTOTAL, CKMB, CKMBINDEX, TROPONINI in the last 168 hours. BNP (last 3 results) No results for input(s): PROBNP in the last 8760 hours. HbA1C: No results for input(s): HGBA1C in the last 72 hours. CBG: No results for input(s): GLUCAP in the last 168 hours. Lipid Profile: Recent Labs    11/19/20 1934  TRIG 90   Thyroid Function Tests: No results for input(s): TSH, T4TOTAL, FREET4, T3FREE, THYROIDAB in the last 72 hours. Anemia Panel:  Recent Labs    11/19/20 1934  FERRITIN 979*   Urine analysis:    Component Value Date/Time   COLORURINE YELLOW 05/12/2016 0834   APPEARANCEUR CLOUDY (A) 05/12/2016 0834   LABSPEC 1.014 05/12/2016 0834   PHURINE 5.0 05/12/2016 0834   GLUCOSEU NEGATIVE 05/12/2016 0834   HGBUR MODERATE (A) 05/12/2016 0834   BILIRUBINUR NEGATIVE 05/12/2016 0834   KETONESUR NEGATIVE 05/12/2016 0834   PROTEINUR 100 (A) 05/12/2016 0834   UROBILINOGEN 0.2 12/09/2013 0013   NITRITE NEGATIVE 05/12/2016 0834   LEUKOCYTESUR NEGATIVE 05/12/2016 0834   Sepsis Labs: '@LABRCNTIP'$ (procalcitonin:4,lacticidven:4)  ) Recent Results (from the past 240 hour(s))  SARS Coronavirus 2 by RT PCR (hospital  order, performed in [place] hospital lab) Nasopharyngeal Nasopharyngeal Swab     Status: None   Collection Time: 11/19/20  7:36 PM   Specimen: Nasopharyngeal Swab  Result Value Ref Range Status   SARS Coronavirus 2 NEGATIVE NEGATIVE Final    Comment: (NOTE) SARS-CoV-2 target nucleic acids are NOT DETECTED.  The SARS-CoV-2 RNA is generally detectable in upper and lower respiratory specimens during the acute phase of infection. The lowest concentration of SARS-CoV-2 viral copies this assay can detect is 250 copies / mL. A negative result does not preclude SARS-CoV-2 infection and should not be used as the sole basis for treatment or other patient management decisions.  A negative result may occur with improper specimen collection / handling, submission of specimen other than nasopharyngeal swab, presence of viral mutation(s) within the areas targeted by this assay, and inadequate number of viral copies (<250 copies / mL). A negative result must be combined with clinical observations, patient history, and epidemiological information.  Fact Sheet for Patients:   StrictlyIdeas.no  Fact Sheet for Healthcare Providers: BankingDealers.co.za  This test is not yet approved or  cleared by the [place] FDA and has been authorized for detection and/or diagnosis of SARS-CoV-2 by FDA under an Emergency Use Authorization (EUA).  This EUA will remain in effect (meaning this test can be used) for the duration of the COVID-19 declaration under Section 564(b)(1) of the Act, 21 U.S.C. section 360bbb-3(b)(1), unless the authorization is terminated or revoked sooner.  Performed at [facility], [place] [address], [place], [place] [address]   Blood Culture (routine x 2)     Status: None (Preliminary result)   Collection Time: 11/19/20  8:16 PM   Specimen: BLOOD  Result Value Ref Range Status   Specimen Description BLOOD RIGHT ANTECUBITAL   Final   Special Requests   Final    BOTTLES DRAWN AEROBIC AND ANAEROBIC Blood Culture results may not be optimal due to an inadequate volume of blood received in culture bottles   Culture   Final    NO GROWTH < 24 HOURS Performed at [facility], [place] [address] [place], [place] [address]    Report Status PENDING  Incomplete      Studies: VAS [place] LOWER EXTREMITY VENOUS (DVT)  Result Date: 11/20/2020  Lower Venous DVT Study Indications: Elevated Ddimer.  Risk Factors: None identified. Comparison Study: No prior studies. Performing Technologist: Oliver Hum RVT  Examination Guidelines: A complete evaluation includes B-mode imaging, spectral Doppler, color Doppler, and power Doppler as needed of all accessible portions of each vessel. Bilateral testing is considered an integral part of a complete examination. Limited examinations for reoccurring indications may be performed as noted. The reflux portion of the exam is performed with the patient in reverse Trendelenburg.  +---------+---------------+---------+-----------+----------+--------------+ RIGHT  CompressibilityPhasicitySpontaneityPropertiesThrombus Aging +---------+---------------+---------+-----------+----------+--------------+ CFV      Full           Yes      Yes                                 +---------+---------------+---------+-----------+----------+--------------+ SFJ      Full                                                        +---------+---------------+---------+-----------+----------+--------------+ FV Prox  Full                                                        +---------+---------------+---------+-----------+----------+--------------+ FV Mid   Full                                                        +---------+---------------+---------+-----------+----------+--------------+ FV DistalFull                                                         +---------+---------------+---------+-----------+----------+--------------+ PFV      Full                                                        +---------+---------------+---------+-----------+----------+--------------+ POP      Full           Yes      Yes                                 +---------+---------------+---------+-----------+----------+--------------+ PTV      Full                                                        +---------+---------------+---------+-----------+----------+--------------+ PERO     Full                                                        +---------+---------------+---------+-----------+----------+--------------+   +---------+---------------+---------+-----------+----------+--------------+ LEFT     CompressibilityPhasicitySpontaneityPropertiesThrombus Aging +---------+---------------+---------+-----------+----------+--------------+ CFV      Full           Yes      Yes                                 +---------+---------------+---------+-----------+----------+--------------+  SFJ      Full                                                        +---------+---------------+---------+-----------+----------+--------------+ FV Prox  Full                                                        +---------+---------------+---------+-----------+----------+--------------+ FV Mid   Full                                                        +---------+---------------+---------+-----------+----------+--------------+ FV DistalFull                                                        +---------+---------------+---------+-----------+----------+--------------+ PFV      Full                                                        +---------+---------------+---------+-----------+----------+--------------+ POP      Full           Yes      Yes                                  +---------+---------------+---------+-----------+----------+--------------+ PTV      Full                                                        +---------+---------------+---------+-----------+----------+--------------+ PERO     Full                                                        +---------+---------------+---------+-----------+----------+--------------+     Summary: RIGHT: - There is no evidence of deep vein thrombosis in the lower extremity.  - No cystic structure found in the popliteal fossa.  LEFT: - There is no evidence of deep vein thrombosis in the lower extremity.  - No cystic structure found in the popliteal fossa.  *See table(s) above for measurements and observations. Electronically signed by Deitra Mayo MD on 11/20/2020 at 4:10:40 PM.    Final     Scheduled Meds: . amLODipine  5 mg Oral Daily  . aspirin EC  81 mg Oral Daily  . atorvastatin  80 mg Oral QHS  . calcitRIOL  2.25  mcg Oral Q T,Th,Sa-HD  . Chlorhexidine Gluconate Cloth  6 each Topical Q0600  . cinacalcet  30 mg Oral Q T,Th,Sa-HD  . clopidogrel  75 mg Oral QPM  . feeding supplement (NEPRO CARB STEADY)  237 mL Oral TID BM  . heparin  5,000 Units Subcutaneous Q8H  . isosorbide mononitrate  90 mg Oral Daily  . lanthanum  1,000 mg Oral TID WC  . metoprolol succinate  25 mg Oral Daily  . pantoprazole  40 mg Oral Q0600    Continuous Infusions: . sodium chloride    . sodium chloride       LOS: 1 day     Cristal Deer, MD [facility]  To reach me or the doctor on call, go to: [email or web address] Password [email or web address]  11/21/2020, 9:59 AM

## 2020-11-21 NOTE — Progress Notes (Addendum)
[facility] Progress Note   Subjective:   Pt had HD yesterday with net UF 2.5L. Reports he is feeling better, SOB is resolved. No chest pain at present. Denies abdominal pain, nausea and vomiting.   Objective Vitals:   11/20/20 2148 11/20/20 2236 11/21/20 0204 11/21/20 0626  BP: (!) 160/88 (!) 148/83 (!) 152/80 (!) 148/79  Pulse: 93 87 97 98  Resp: '18 19 18 18  '$ Temp: 98.3 F (36.8 C) 98.3 F (36.8 C) 98 F (36.7 C) 98.3 F (36.8 C)  TempSrc: Oral Oral Oral Oral  SpO2: 93% 99% 99% 100%  Weight:      Height:       Physical Exam General: Well developed male, alert and in NAD Heart: RRR, no murmur auscultated Lungs: CTA bilaterally without wheezing, rhonchi or rales Abdomen: Soft, non-tender, non-distended, +BS Extremities: No edema b/l lower extremities Dialysis Access: LUE AVF + bruit  Additional Objective Labs: Basic Metabolic Panel: Recent Labs  Lab 11/19/20 1934 11/20/20 0814 11/21/20 0101  NA 140 139 139  K 5.4* 5.0 4.1  CL 99 100 97*  CO2 23 21* 27  GLUCOSE 81 87 114*  BUN 65* 71* 45*  CREATININE 12.24* 13.54* 10.24*  CALCIUM 9.1 8.6* 8.1*  PHOS  --  6.3*  --    Liver Function Tests: Recent Labs  Lab 11/19/20 1934 11/20/20 0814  AST 14*  --   ALT 10  --   ALKPHOS 60  --   BILITOT 0.8  --   PROT 8.1  --   ALBUMIN 3.1* 2.9*   CBC: Recent Labs  Lab 11/19/20 1934 11/20/20 0901 11/21/20 0101  WBC 9.4 8.6 6.4  NEUTROABS 6.3  --   --   HGB 11.5* 9.9* 10.4*  HCT 35.4* 29.6* 30.0*  MCV 96.7 94.9 93.8  PLT 282 244 222   Blood Culture    Component Value Date/Time   SDES BLOOD RIGHT ANTECUBITAL 11/19/2020 2016   SPECREQUEST  11/19/2020 2016    BOTTLES DRAWN AEROBIC AND ANAEROBIC Blood Culture results may not be optimal due to an inadequate volume of blood received in culture bottles   CULT  11/19/2020 2016    NO GROWTH 2 DAYS Performed at [facility], [address] [place], [place] [address]    REPTSTATUS PENDING  11/19/2020 2016    Iron Studies:  Recent Labs    11/19/20 1934  FERRITIN 979*    Studies/Results: DG Chest Port 1 View  Result Date: 11/19/2020 CLINICAL DATA:  Short of breath, previous tobacco abuse, COPD EXAM: PORTABLE CHEST 1 VIEW COMPARISON:  10/26/2020 FINDINGS: Single frontal view of the chest demonstrates mild enlargement of the cardiac silhouette. There is diffuse increased interstitial prominence with bilateral ground-glass airspace disease. Trace right effusion is noted. No pneumothorax. IMPRESSION: 1. Findings most consistent with congestive heart failure. Electronically Signed   By: Randa Ngo M.D.   On: 11/19/2020 19:52   VAS [place] LOWER EXTREMITY VENOUS (DVT)  Result Date: 11/20/2020  Lower Venous DVT Study Indications: Elevated Ddimer.  Risk Factors: None identified. Comparison Study: No prior studies. Performing Technologist: Oliver Hum RVT  Examination Guidelines: A complete evaluation includes B-mode imaging, spectral Doppler, color Doppler, and power Doppler as needed of all accessible portions of each vessel. Bilateral testing is considered an integral part of a complete examination. Limited examinations for reoccurring indications may be performed as noted. The reflux portion of the exam is performed with the patient in reverse Trendelenburg.  +---------+---------------+---------+-----------+----------+--------------+  RIGHT    CompressibilityPhasicitySpontaneityPropertiesThrombus Aging +---------+---------------+---------+-----------+----------+--------------+ CFV      Full           Yes      Yes                                 +---------+---------------+---------+-----------+----------+--------------+ SFJ      Full                                                        +---------+---------------+---------+-----------+----------+--------------+ FV Prox  Full                                                         +---------+---------------+---------+-----------+----------+--------------+ FV Mid   Full                                                        +---------+---------------+---------+-----------+----------+--------------+ FV DistalFull                                                        +---------+---------------+---------+-----------+----------+--------------+ PFV      Full                                                        +---------+---------------+---------+-----------+----------+--------------+ POP      Full           Yes      Yes                                 +---------+---------------+---------+-----------+----------+--------------+ PTV      Full                                                        +---------+---------------+---------+-----------+----------+--------------+ PERO     Full                                                        +---------+---------------+---------+-----------+----------+--------------+   +---------+---------------+---------+-----------+----------+--------------+ LEFT     CompressibilityPhasicitySpontaneityPropertiesThrombus Aging +---------+---------------+---------+-----------+----------+--------------+ CFV      Full           Yes      Yes                                 +---------+---------------+---------+-----------+----------+--------------+  SFJ      Full                                                        +---------+---------------+---------+-----------+----------+--------------+ FV Prox  Full                                                        +---------+---------------+---------+-----------+----------+--------------+ FV Mid   Full                                                        +---------+---------------+---------+-----------+----------+--------------+ FV DistalFull                                                         +---------+---------------+---------+-----------+----------+--------------+ PFV      Full                                                        +---------+---------------+---------+-----------+----------+--------------+ POP      Full           Yes      Yes                                 +---------+---------------+---------+-----------+----------+--------------+ PTV      Full                                                        +---------+---------------+---------+-----------+----------+--------------+ PERO     Full                                                        +---------+---------------+---------+-----------+----------+--------------+     Summary: RIGHT: - There is no evidence of deep vein thrombosis in the lower extremity.  - No cystic structure found in the popliteal fossa.  LEFT: - There is no evidence of deep vein thrombosis in the lower extremity.  - No cystic structure found in the popliteal fossa.  *See table(s) above for measurements and observations. Electronically signed by Deitra Mayo MD on 11/20/2020 at 4:10:40 PM.    Final    Medications: . sodium chloride    . sodium chloride     . amLODipine  5 mg Oral Daily  . aspirin EC  81 mg Oral Daily  .  atorvastatin  80 mg Oral QHS  . calcitRIOL  2.25 mcg Oral Q T,Th,Sa-HD  . Chlorhexidine Gluconate Cloth  6 each Topical Q0600  . cinacalcet  30 mg Oral Q T,Th,Sa-HD  . clopidogrel  75 mg Oral QPM  . feeding supplement (NEPRO CARB STEADY)  237 mL Oral TID BM  . heparin  5,000 Units Subcutaneous Q8H  . isosorbide mononitrate  90 mg Oral Daily  . lanthanum  1,000 mg Oral TID WC  . metoprolol succinate  25 mg Oral Daily  . pantoprazole  40 mg Oral Q0600    Dialysis Orders: Center: [facility]. TueThuSat, 4 hrs 0 min, 180NRe Optiflux, BFR 400, DFR Manual 800 mL/min, EDW 82.5 (kg), Dialysate 2.0 K, 2.0 Ca Heparin 2400 unit bolus sensipar 30 mg q HD mircera 75 mcg q 2 weeks- last dose 1/13 Calcitriol 2.5  mcg PO q HD  Assessment/Plan: 1.  Volume overload: CXR consistent with volume overload. Pt has not been meeting his EDW or following fluid restrictions. Had 2.5L net UF yesterday and SOB is resolved. We discussed the importance of fluid restrictions especially in light of cardiac disease.  2.  ESRD: Continue TTS schedule. Dialysis treatments will be shortened here due to high inpatient census. Patient will get more consistent/longer dialysis outpatient when he is able to discharge.   3.  Hypertension/volume: BP remains elevated. Continue home meds, further UF with HD as tolerated.  4.  Anemia: Hgb at goal. Not due for ESA yet and may need decreased dose.  5.  Metabolic bone disease: Calcium controlled, phosphorus elevated. Resume VDRA, sensipar and binders 6.  Nutrition:  Discussed renal diet and fluid restrictions. Albumin low, will start a protein supplement.  7. Severe multivessel CAD: Recurrent hospitalizations for the same. Cardiology recommended medical management, not a candidate for CABG or PCI. No chest pain at present.   Anice Paganini, PA-C 11/21/2020, 11:08 AM  [facility] Pager: [phone]  Patient seen and examined, agree with above note with above modifications. In NAD-  No 02-  Really doesn't seem to understand the concept of fluid restriction-  Has been eating lots of ice-  I encouraged him instead to use gum or hard candy-  HD tomorrow with UF as able  Corliss Parish, MD 11/21/2020

## 2020-11-22 DIAGNOSIS — Z992 Dependence on renal dialysis: Secondary | ICD-10-CM

## 2020-11-22 DIAGNOSIS — N186 End stage renal disease: Secondary | ICD-10-CM

## 2020-11-22 DIAGNOSIS — E8770 Fluid overload, unspecified: Secondary | ICD-10-CM | POA: Diagnosis present

## 2020-11-22 DIAGNOSIS — I10 Essential (primary) hypertension: Secondary | ICD-10-CM

## 2020-11-22 MED ORDER — CALCITRIOL 0.5 MCG PO CAPS
ORAL_CAPSULE | ORAL | Status: AC
  Filled 2020-11-22: qty 4

## 2020-11-22 MED ORDER — CALCITRIOL 0.25 MCG PO CAPS
ORAL_CAPSULE | ORAL | Status: AC
  Filled 2020-11-22: qty 1

## 2020-11-22 NOTE — Progress Notes (Signed)
[facility] Progress Note   Subjective:   Seen on dialysis. Reports feeling better. No CP or SOB at present.   Objective Vitals:   11/22/20 1041 11/22/20 1100 11/22/20 1130 11/22/20 1200  BP: (!) 158/70 (!) 156/61 115/61 (!) 149/48  Pulse:      Resp: '15 15 15 17  '$ Temp:      TempSrc:      SpO2: (!) 20%     Weight:      Height:       Physical Exam General: Well developed male, alert and in NAD Heart: RRR, no murmur auscultated Lungs: CTA bilaterally without wheezing, rhonchi or rales Abdomen: Soft, non-tender, non-distended, +BS Extremities: No edema b/l lower extremities Dialysis Access: LUE AVF + bruit  Additional Objective Labs: Basic Metabolic Panel: Recent Labs  Lab 11/19/20 1934 11/20/20 0814 11/21/20 0101  NA 140 139 139  K 5.4* 5.0 4.1  CL 99 100 97*  CO2 23 21* 27  GLUCOSE 81 87 114*  BUN 65* 71* 45*  CREATININE 12.24* 13.54* 10.24*  CALCIUM 9.1 8.6* 8.1*  PHOS  --  6.3*  --    Liver Function Tests: Recent Labs  Lab 11/19/20 1934 11/20/20 0814  AST 14*  --   ALT 10  --   ALKPHOS 60  --   BILITOT 0.8  --   PROT 8.1  --   ALBUMIN 3.1* 2.9*   CBC: Recent Labs  Lab 11/19/20 1934 11/20/20 0901 11/21/20 0101  WBC 9.4 8.6 6.4  NEUTROABS 6.3  --   --   HGB 11.5* 9.9* 10.4*  HCT 35.4* 29.6* 30.0*  MCV 96.7 94.9 93.8  PLT 282 244 222   Blood Culture    Component Value Date/Time   SDES BLOOD RIGHT ANTECUBITAL 11/19/2020 2016   SPECREQUEST  11/19/2020 2016    BOTTLES DRAWN AEROBIC AND ANAEROBIC Blood Culture results may not be optimal due to an inadequate volume of blood received in culture bottles   CULT  11/19/2020 2016    NO GROWTH 2 DAYS Performed at [facility], [address], [place], [place] [address]    REPTSTATUS PENDING 11/19/2020 2016    Iron Studies:  Recent Labs    11/19/20 1934  FERRITIN 979*    Studies/Results: VAS [place] LOWER EXTREMITY VENOUS (DVT)  Result Date: 11/20/2020  Lower Venous DVT  Study Indications: Elevated Ddimer.  Risk Factors: None identified. Comparison Study: No prior studies. Performing Technologist: Oliver Hum RVT  Examination Guidelines: A complete evaluation includes B-mode imaging, spectral Doppler, color Doppler, and power Doppler as needed of all accessible portions of each vessel. Bilateral testing is considered an integral part of a complete examination. Limited examinations for reoccurring indications may be performed as noted. The reflux portion of the exam is performed with the patient in reverse Trendelenburg.  +---------+---------------+---------+-----------+----------+--------------+ RIGHT    CompressibilityPhasicitySpontaneityPropertiesThrombus Aging +---------+---------------+---------+-----------+----------+--------------+ CFV      Full           Yes      Yes                                 +---------+---------------+---------+-----------+----------+--------------+ SFJ      Full                                                        +---------+---------------+---------+-----------+----------+--------------+  FV Prox  Full                                                        +---------+---------------+---------+-----------+----------+--------------+ FV Mid   Full                                                        +---------+---------------+---------+-----------+----------+--------------+ FV DistalFull                                                        +---------+---------------+---------+-----------+----------+--------------+ PFV      Full                                                        +---------+---------------+---------+-----------+----------+--------------+ POP      Full           Yes      Yes                                 +---------+---------------+---------+-----------+----------+--------------+ PTV      Full                                                         +---------+---------------+---------+-----------+----------+--------------+ PERO     Full                                                        +---------+---------------+---------+-----------+----------+--------------+   +---------+---------------+---------+-----------+----------+--------------+ LEFT     CompressibilityPhasicitySpontaneityPropertiesThrombus Aging +---------+---------------+---------+-----------+----------+--------------+ CFV      Full           Yes      Yes                                 +---------+---------------+---------+-----------+----------+--------------+ SFJ      Full                                                        +---------+---------------+---------+-----------+----------+--------------+ FV Prox  Full                                                        +---------+---------------+---------+-----------+----------+--------------+  FV Mid   Full                                                        +---------+---------------+---------+-----------+----------+--------------+ FV DistalFull                                                        +---------+---------------+---------+-----------+----------+--------------+ PFV      Full                                                        +---------+---------------+---------+-----------+----------+--------------+ POP      Full           Yes      Yes                                 +---------+---------------+---------+-----------+----------+--------------+ PTV      Full                                                        +---------+---------------+---------+-----------+----------+--------------+ PERO     Full                                                        +---------+---------------+---------+-----------+----------+--------------+     Summary: RIGHT: - There is no evidence of deep vein thrombosis in the lower extremity.  - No cystic structure found in  the popliteal fossa.  LEFT: - There is no evidence of deep vein thrombosis in the lower extremity.  - No cystic structure found in the popliteal fossa.  *See table(s) above for measurements and observations. Electronically signed by Deitra Mayo MD on 11/20/2020 at 4:10:40 PM.    Final    Medications: . sodium chloride    . sodium chloride     . amLODipine  5 mg Oral Daily  . aspirin EC  81 mg Oral Daily  . atorvastatin  80 mg Oral QHS  . calcitRIOL  2.25 mcg Oral Q T,Th,Sa-HD  . Chlorhexidine Gluconate Cloth  6 each Topical Q0600  . Chlorhexidine Gluconate Cloth  6 each Topical Q0600  . cinacalcet  30 mg Oral Q T,Th,Sa-HD  . clopidogrel  75 mg Oral QPM  . feeding supplement (NEPRO CARB STEADY)  237 mL Oral TID BM  . heparin  5,000 Units Subcutaneous Q8H  . isosorbide mononitrate  90 mg Oral Daily  . lanthanum  1,000 mg Oral TID WC  . metoprolol succinate  25 mg Oral Daily  . pantoprazole  40 mg Oral Q0600    Dialysis Orders: Center:[facility]. TueThuSat, 4 hrs 0 min, 180NRe Optiflux, BFR 400,  DFR Manual 800 mL/min, EDW 82.5 (kg), Dialysate 2.0 K, 2.0 Ca Heparin 2400 unit bolus sensipar 30 mg q HD mircera 75 mcg q 2 weeks- last dose 1/13 Calcitriol 2.5 mcg PO q HD  Assessment/Plan: 1. Volume overload: CXR consistent with volume overload. Pt has not been meeting his EDW or following fluid restrictions. Had 2.5L net UF last HD and SOB is resolved. Large fluid gain again if weights are correct- reeval post  HD. We discussed the importance of fluid restrictions especially in light of cardiac disease.  2. ESRD:Continue TTS schedule. Dialysis treatments will be shortened here due to high inpatient census. Patient will get more consistent/longer dialysis outpatient when he is able to discharge.   3. Hypertension/volume:BP remains elevated but SOB is resolved and no edema on exam. Continue home meds, further UF with HD as tolerated.  4. Anemia:Hgb at goal. Not due for ESA yet and may  need decreased dose. 5. Metabolic bone disease:Calcium controlled, phosphorus elevated. Resume VDRA, sensipar and binders 6. Nutrition:Discussed renal diet and fluid restrictions. Albumin low, will start a protein supplement.  7. Severe multivessel [id number] hospitalizations for the same. Cardiology recommended medical management, not a candidate for CABG or PCI. No chest pain at present.    Anice Paganini, PA-C 11/22/2020, 12:29 PM  [facility] Pager: [phone]

## 2020-11-22 NOTE — Discharge Summary (Signed)
.   Discharge Summary  Kenneth [name] [name] DOB: [birth year]  PCP: [name], MD  Admit date: 11/19/2020 Discharge date: 11/22/2020  Time spent:<31  Recommendations for Outpatient Follow-up:  1. Hd 2. pcp  Discharge Diagnoses:  Active Hospital Problems   Diagnosis Date Noted  . Volume overload 11/19/2020  . Fluid overload 11/22/2020  . PAF (paroxysmal atrial fibrillation) ([place]) 10/27/2020  . CAD (coronary artery disease)   . Acute hypoxemic respiratory failure ([place]) 05/11/2018  . ESRD on hemodialysis ([place]) 10/08/2016  . Essential hypertension 10/09/2011    Resolved Hospital Problems  No resolved problems to display.    Discharge Condition: home  Diet recommendation: renal  Vitals:   11/22/20 1318 11/22/20 1448  BP: (!) 117/48 129/70  Pulse:  67  Resp:  16  Temp: 98.1 F (36.7 C) 98.6 F (37 C)  SpO2: 96% 100%    History of present illness:   Kenneth Malen Jonesis a 65 y.o.malewith medical history significant ofmultivessel CAD-not felt to be a good candidate for PCI/CABG and medically managed,PEA arrest in 2019, chronic combined systolic and diastolicheart failure,paroxysmal A. fib,ESRD on HD TTS, hypertensionpresenting to the ED via EMS for evaluation of shortness of breath. SPO2 91% on room air with EMS, placed on nonrebreather.Patient is a poor historian. Reports feeling short of breath for the past 2 months. Also endorsing orthopnea. Denies lower extremity edema. States for the past 7 months he is having throbbing left-sided chest pain which comes and goes. Today his chest pain started again in the morning at rest. Denies fevers or cough. States he is vaccinated against Covid including booster shot  Hospital Course:  Principal Problem:   Volume overload Active Problems:   Essential hypertension   ESRD on hemodialysis (HCC)   Acute hypoxemic respiratory failure (HCC)   CAD (coronary artery disease)   PAF (paroxysmal atrial  fibrillation) (HCC)   Fluid overload  Continue to maintain O2 saturation greater than 90% on room air he said he had hemodialysis 1/25 and 1/27 ,he feels better   Acute hypoxic respiratory failure secondary to volume overload in the setting of ESRD on HD and chronic combined systolic and diastolic CHF. His last hemodialysis session was the day prior to his presentation. his O2 saturation was  88% on room air on admission. Requiring 4 L to maintain during stay he  Continue to maintain O2 saturation greater than 92% on room air. Evidence of pulmonary edema on chest x-ray. He had  HD on 11/20/2020,  removd 3 L.  Consultants:  Nephrology  Procedures:  Hemodialysis November 20, 2020   Discharge Exam: BP 129/70 (BP Location: Right Arm)   Pulse 67   Temp 98.6 F (37 C)   Resp 16   Ht '5\' 11"'$  (1.803 m)   Wt 92.5 kg   SpO2 100%   BMI 28.44 kg/m     General: 65 y.o. year-old male well developed well nourished in no acute distress.  Alert and oriented x3.  Cardiovascular: Regular rate and rhythm with no rubs or gallops.  No thyromegaly or JVD noted.    Respiratory: Clear to auscultation with no wheezes or rales. Good inspiratory effort.  Abdomen: Soft nontender nondistended with normal bowel sounds x4 quadrants.  Musculoskeletal: No lower extremity edema. 2/4 pulses in all 4 extremities.  Skin: No ulcerative lesions noted or rashes,  Psychiatry: Mood is appropriate for condition and setting  Discharge Instructions You were cared for by a hospitalist during your hospital stay. If  you have any questions about your discharge medications or the care you received while you were in the hospital after you are discharged, you can call the unit and asked to speak with the hospitalist on call if the hospitalist that took care of you is not available. Once you are discharged, your primary care physician will handle any further medical issues. Please note that NO REFILLS for any discharge  medications will be authorized once you are discharged, as it is imperative that you return to your primary care physician (or establish a relationship with a primary care physician if you do not have one) for your aftercare needs so that they can reassess your need for medications and monitor your lab values.  Discharge Instructions    Call MD for:  difficulty breathing, headache or visual disturbances   Complete by: As directed    Call MD for:  temperature >100.4   Complete by: As directed    Diet - low sodium heart healthy   Complete by: As directed    Discharge instructions   Complete by: As directed    Adhere to HD schedule ,avoid missing HD. Restrict water intake   Increase activity slowly   Complete by: As directed    No wound care   Complete by: As directed      Allergies as of 11/22/2020   No Known Allergies     Medication List    TAKE these medications   amLODipine 10 MG tablet Commonly known as: NORVASC Take 0.5 tablets (5 mg total) by mouth daily.   aspirin 81 MG EC tablet Take 1 tablet (81 mg total) by mouth daily. Swallow whole.   atorvastatin 80 MG tablet Commonly known as: LIPITOR Take 1 tablet (80 mg total) by mouth daily. What changed: when to take this   clopidogrel 75 MG tablet Commonly known as: PLAVIX Take 1 tablet (75 mg total) by mouth daily. What changed: when to take this   famotidine 20 MG tablet Commonly known as: PEPCID Take 1 tablet (20 mg total) by mouth 2 (two) times daily as needed for heartburn.   isosorbide mononitrate 30 MG 24 hr tablet Commonly known as: IMDUR Take 3 tablets (90 mg total) by mouth daily.   lanthanum 1000 MG chewable tablet Commonly known as: FOSRENOL Chew 1,000 mg by mouth 3 (three) times daily.   metoprolol succinate 25 MG 24 hr tablet Commonly known as: TOPROL-XL Take 25 mg by mouth in the morning and at bedtime.   nitroGLYCERIN 0.4 MG SL tablet Commonly known as: NITROSTAT Place 1 tablet (0.4 mg total)  under the tongue every 5 (five) minutes as needed for chest pain (max 3 doses).   pantoprazole 40 MG tablet Commonly known as: PROTONIX Take 1 tablet (40 mg total) by mouth daily at 6 (six) AM.   Rena-Vite Rx 1 MG Tabs Take 1 tablet by mouth daily.   Tylenol PM Extra Strength 25-500 MG Tabs tablet Generic drug: diphenhydramine-acetaminophen Take 2 tablets by mouth at bedtime as needed (pain).   VITAMIN D PO Take 1 tablet by mouth daily.      No Known Allergies    The results of significant diagnostics from this hospitalization (including imaging, microbiology, ancillary and laboratory) are listed below for reference.    Significant Diagnostic Studies: DG Chest 2 View  Result Date: 10/26/2020 CLINICAL DATA:  Chest pain EXAM: CHEST - 2 VIEW COMPARISON:  10/16/2020 FINDINGS: No focal opacity or pleural effusion. Normal cardiomediastinal silhouette. No pneumothorax.  Aortic atherosclerosis. IMPRESSION: No active cardiopulmonary disease. Electronically Signed   By: Donavan Foil M.D.   On: 10/26/2020 22:44   DG Chest Port 1 View  Result Date: 11/19/2020 CLINICAL DATA:  Short of breath, previous tobacco abuse, COPD EXAM: PORTABLE CHEST 1 VIEW COMPARISON:  10/26/2020 FINDINGS: Single frontal view of the chest demonstrates mild enlargement of the cardiac silhouette. There is diffuse increased interstitial prominence with bilateral ground-glass airspace disease. Trace right effusion is noted. No pneumothorax. IMPRESSION: 1. Findings most consistent with congestive heart failure. Electronically Signed   By: Randa Ngo M.D.   On: 11/19/2020 19:52   VAS [place] LOWER EXTREMITY VENOUS (DVT)  Result Date: 11/20/2020  Lower Venous DVT Study Indications: Elevated Ddimer.  Risk Factors: None identified. Comparison Study: No prior studies. Performing Technologist: Oliver Hum RVT  Examination Guidelines: A complete evaluation includes B-mode imaging, spectral Doppler, color Doppler, and power  Doppler as needed of all accessible portions of each vessel. Bilateral testing is considered an integral part of a complete examination. Limited examinations for reoccurring indications may be performed as noted. The reflux portion of the exam is performed with the patient in reverse Trendelenburg.  +---------+---------------+---------+-----------+----------+--------------+ RIGHT    CompressibilityPhasicitySpontaneityPropertiesThrombus Aging +---------+---------------+---------+-----------+----------+--------------+ CFV      Full           Yes      Yes                                 +---------+---------------+---------+-----------+----------+--------------+ SFJ      Full                                                        +---------+---------------+---------+-----------+----------+--------------+ FV Prox  Full                                                        +---------+---------------+---------+-----------+----------+--------------+ FV Mid   Full                                                        +---------+---------------+---------+-----------+----------+--------------+ FV DistalFull                                                        +---------+---------------+---------+-----------+----------+--------------+ PFV      Full                                                        +---------+---------------+---------+-----------+----------+--------------+ POP      Full           Yes      Yes                                 +---------+---------------+---------+-----------+----------+--------------+  PTV      Full                                                        +---------+---------------+---------+-----------+----------+--------------+ PERO     Full                                                        +---------+---------------+---------+-----------+----------+--------------+    +---------+---------------+---------+-----------+----------+--------------+ LEFT     CompressibilityPhasicitySpontaneityPropertiesThrombus Aging +---------+---------------+---------+-----------+----------+--------------+ CFV      Full           Yes      Yes                                 +---------+---------------+---------+-----------+----------+--------------+ SFJ      Full                                                        +---------+---------------+---------+-----------+----------+--------------+ FV Prox  Full                                                        +---------+---------------+---------+-----------+----------+--------------+ FV Mid   Full                                                        +---------+---------------+---------+-----------+----------+--------------+ FV DistalFull                                                        +---------+---------------+---------+-----------+----------+--------------+ PFV      Full                                                        +---------+---------------+---------+-----------+----------+--------------+ POP      Full           Yes      Yes                                 +---------+---------------+---------+-----------+----------+--------------+ PTV      Full                                                        +---------+---------------+---------+-----------+----------+--------------+  PERO     Full                                                        +---------+---------------+---------+-----------+----------+--------------+     Summary: RIGHT: - There is no evidence of deep vein thrombosis in the lower extremity.  - No cystic structure found in the popliteal fossa.  LEFT: - There is no evidence of deep vein thrombosis in the lower extremity.  - No cystic structure found in the popliteal fossa.  *See table(s) above for measurements and observations. Electronically signed  by Deitra Mayo MD on 11/20/2020 at 4:10:40 PM.    Final     Microbiology: Recent Results (from the past 240 hour(s))  SARS Coronavirus 2 by RT PCR (hospital order, performed in [facility] hospital lab) Nasopharyngeal Nasopharyngeal Swab     Status: None   Collection Time: 11/19/20  7:36 PM   Specimen: Nasopharyngeal Swab  Result Value Ref Range Status   SARS Coronavirus 2 NEGATIVE NEGATIVE Final    Comment: (NOTE) SARS-CoV-2 target nucleic acids are NOT DETECTED.  The SARS-CoV-2 RNA is generally detectable in upper and lower respiratory specimens during the acute phase of infection. The lowest concentration of SARS-CoV-2 viral copies this assay can detect is 250 copies / mL. A negative result does not preclude SARS-CoV-2 infection and should not be used as the sole basis for treatment or other patient management decisions.  A negative result may occur with improper specimen collection / handling, submission of specimen other than nasopharyngeal swab, presence of viral mutation(s) within the areas targeted by this assay, and inadequate number of viral copies (<250 copies / mL). A negative result must be combined with clinical observations, patient history, and epidemiological information.  Fact Sheet for Patients:   StrictlyIdeas.no  Fact Sheet for Healthcare Providers: BankingDealers.co.za  This test is not yet approved or  cleared by the [place] FDA and has been authorized for detection and/or diagnosis of SARS-CoV-2 by FDA under an Emergency Use Authorization (EUA).  This EUA will remain in effect (meaning this test can be used) for the duration of the COVID-19 declaration under Section 564(b)(1) of the Act, 21 U.S.C. section 360bbb-3(b)(1), unless the authorization is terminated or revoked sooner.  Performed at [facility], [place] [address], [place], [place] [address]   Blood Culture (routine x 2)      Status: None (Preliminary result)   Collection Time: 11/19/20  8:16 PM   Specimen: BLOOD  Result Value Ref Range Status   Specimen Description BLOOD RIGHT ANTECUBITAL  Final   Special Requests   Final    BOTTLES DRAWN AEROBIC AND ANAEROBIC Blood Culture results may not be optimal due to an inadequate volume of blood received in culture bottles   Culture   Final    NO GROWTH 3 DAYS Performed at [facility], [place] [address], [place], [place] [address]    Report Status PENDING  Incomplete     Labs: Basic Metabolic Panel: Recent Labs  Lab 11/19/20 1934 11/20/20 0814 11/21/20 0101  NA 140 139 139  K 5.4* 5.0 4.1  CL 99 100 97*  CO2 23 21* 27  GLUCOSE 81 87 114*  BUN 65* 71* 45*  CREATININE 12.24* 13.54* 10.24*  CALCIUM 9.1 8.6* 8.1*  PHOS  --  6.3*  --    Liver Function Tests: Recent Labs  Lab 11/19/20 1934 11/20/20 0814  AST 14*  --   ALT 10  --   ALKPHOS 60  --   BILITOT 0.8  --   PROT 8.1  --   ALBUMIN 3.1* 2.9*   No results for input(s): LIPASE, AMYLASE in the last 168 hours. No results for input(s): AMMONIA in the last 168 hours. CBC: Recent Labs  Lab 11/19/20 1934 11/20/20 0901 11/21/20 0101  WBC 9.4 8.6 6.4  NEUTROABS 6.3  --   --   HGB 11.5* 9.9* 10.4*  HCT 35.4* 29.6* 30.0*  MCV 96.7 94.9 93.8  PLT 282 244 222   Cardiac Enzymes: No results for input(s): CKTOTAL, CKMB, CKMBINDEX, TROPONINI in the last 168 hours. BNP: BNP (last 3 results) No results for input(s): BNP in the last 8760 hours.  ProBNP (last 3 results) No results for input(s): PROBNP in the last 8760 hours.  CBG: No results for input(s): GLUCAP in the last 168 hours.     Signed:  Cristal Deer, MD [facility] 11/22/2020, 4:48 PM

## 2020-11-23 ENCOUNTER — Telehealth: Payer: Self-pay | Admitting: Family Medicine

## 2020-11-23 NOTE — Telephone Encounter (Signed)
Transition Care Management Follow-up Telephone Call  Date of discharge and from where: 11/22/2020 from [place]   How have you been since you were released from the hospital? Patient daughter states he is doing better   Any questions or concerns? No  Items Reviewed:  Did the pt receive and understand the discharge instructions provided? Yes   Medications obtained and verified? Yes   Other? No   Any new allergies since your discharge? No   Dietary orders reviewed? Yes  Do you have support at home? Yes   Home Care and Equipment/Supplies: Were home health services ordered? not applicable If so, what is the name of the agency? N/A   Has the agency set up a time to come to the patient's home? not applicable Were any new equipment or medical supplies ordered?  No What is the name of the medical supply agency? N/A  Were you able to get the supplies/equipment? not applicable Do you have any questions related to the use of the equipment or supplies? No  Functional Questionnaire: (I = Independent and D = Dependent) ADLs: I  Bathing/Dressing- I  Meal Prep- I  Eating- I  Maintaining continence- I  Transferring/Ambulation- I  Managing Meds- I  Follow up appointments reviewed:   PCP Hospital f/u appt confirmed? Yes  Scheduled to see Dr. Volanda Napoleon  on 11/30/2020 @ 11:30 am .  [facility] f/u appt confirmed? No    Are transportation arrangements needed? No   If their condition worsens, is the pt aware to call PCP or go to the Emergency Dept.? Yes  Was the patient provided with contact information for the PCP's office or ED? Yes  Was to pt encouraged to call back with questions or concerns? Yes

## 2020-11-24 DIAGNOSIS — N186 End stage renal disease: Secondary | ICD-10-CM | POA: Diagnosis not present

## 2020-11-24 DIAGNOSIS — N2581 Secondary hyperparathyroidism of renal origin: Secondary | ICD-10-CM | POA: Diagnosis not present

## 2020-11-24 DIAGNOSIS — Z992 Dependence on renal dialysis: Secondary | ICD-10-CM | POA: Diagnosis not present

## 2020-11-24 DIAGNOSIS — D631 Anemia in chronic kidney disease: Secondary | ICD-10-CM | POA: Diagnosis not present

## 2020-11-24 LAB — CULTURE, BLOOD (ROUTINE X 2): Culture: NO GROWTH

## 2020-11-27 ENCOUNTER — Observation Stay (HOSPITAL_COMMUNITY)
Admission: EM | Admit: 2020-11-27 | Discharge: 2020-11-28 | Disposition: A | Discharge status: Home / Self Care | Payer: Medicare Other | Attending: Internal Medicine | Admitting: Internal Medicine

## 2020-11-27 ENCOUNTER — Other Ambulatory Visit: Payer: Self-pay

## 2020-11-27 ENCOUNTER — Inpatient Hospital Stay (HOSPITAL_COMMUNITY): Payer: Medicare Other

## 2020-11-27 ENCOUNTER — Emergency Department (HOSPITAL_COMMUNITY): Payer: Medicare Other

## 2020-11-27 DIAGNOSIS — Z7982 Long term (current) use of aspirin: Secondary | ICD-10-CM | POA: Insufficient documentation

## 2020-11-27 DIAGNOSIS — R748 Abnormal levels of other serum enzymes: Secondary | ICD-10-CM | POA: Diagnosis not present

## 2020-11-27 DIAGNOSIS — Z20822 Contact with and (suspected) exposure to covid-19: Secondary | ICD-10-CM | POA: Insufficient documentation

## 2020-11-27 DIAGNOSIS — Z79899 Other long term (current) drug therapy: Secondary | ICD-10-CM | POA: Insufficient documentation

## 2020-11-27 DIAGNOSIS — I509 Heart failure, unspecified: Secondary | ICD-10-CM | POA: Diagnosis not present

## 2020-11-27 DIAGNOSIS — Z87891 Personal history of nicotine dependence: Secondary | ICD-10-CM | POA: Insufficient documentation

## 2020-11-27 DIAGNOSIS — Z7901 Long term (current) use of anticoagulants: Secondary | ICD-10-CM | POA: Insufficient documentation

## 2020-11-27 DIAGNOSIS — I2510 Atherosclerotic heart disease of native coronary artery without angina pectoris: Secondary | ICD-10-CM | POA: Diagnosis not present

## 2020-11-27 DIAGNOSIS — R069 Unspecified abnormalities of breathing: Secondary | ICD-10-CM | POA: Diagnosis not present

## 2020-11-27 DIAGNOSIS — I480 Paroxysmal atrial fibrillation: Secondary | ICD-10-CM | POA: Diagnosis not present

## 2020-11-27 DIAGNOSIS — E875 Hyperkalemia: Secondary | ICD-10-CM | POA: Diagnosis not present

## 2020-11-27 DIAGNOSIS — I5042 Chronic combined systolic (congestive) and diastolic (congestive) heart failure: Secondary | ICD-10-CM | POA: Insufficient documentation

## 2020-11-27 DIAGNOSIS — R0789 Other chest pain: Secondary | ICD-10-CM | POA: Diagnosis not present

## 2020-11-27 DIAGNOSIS — Z992 Dependence on renal dialysis: Secondary | ICD-10-CM | POA: Insufficient documentation

## 2020-11-27 DIAGNOSIS — I132 Hypertensive heart and chronic kidney disease with heart failure and with stage 5 chronic kidney disease, or end stage renal disease: Secondary | ICD-10-CM | POA: Diagnosis not present

## 2020-11-27 DIAGNOSIS — R0602 Shortness of breath: Secondary | ICD-10-CM | POA: Diagnosis not present

## 2020-11-27 DIAGNOSIS — I499 Cardiac arrhythmia, unspecified: Secondary | ICD-10-CM | POA: Diagnosis not present

## 2020-11-27 DIAGNOSIS — N186 End stage renal disease: Secondary | ICD-10-CM | POA: Diagnosis not present

## 2020-11-27 DIAGNOSIS — J9601 Acute respiratory failure with hypoxia: Secondary | ICD-10-CM | POA: Diagnosis not present

## 2020-11-27 DIAGNOSIS — R079 Chest pain, unspecified: Secondary | ICD-10-CM | POA: Diagnosis not present

## 2020-11-27 DIAGNOSIS — I129 Hypertensive chronic kidney disease with stage 1 through stage 4 chronic kidney disease, or unspecified chronic kidney disease: Secondary | ICD-10-CM | POA: Diagnosis not present

## 2020-11-27 DIAGNOSIS — J189 Pneumonia, unspecified organism: Secondary | ICD-10-CM | POA: Diagnosis not present

## 2020-11-27 LAB — CBC WITH DIFFERENTIAL/PLATELET
Abs Immature Granulocytes: 0.04 10*3/uL (ref 0.00–0.07)
Basophils Absolute: 0.1 10*3/uL (ref 0.0–0.1)
Basophils Relative: 1 %
Eosinophils Absolute: 0.7 10*3/uL — ABNORMAL HIGH (ref 0.0–0.5)
Eosinophils Relative: 8 %
HCT: 32.4 % — ABNORMAL LOW (ref 39.0–52.0)
Hemoglobin: 10.5 g/dL — ABNORMAL LOW (ref 13.0–17.0)
Immature Granulocytes: 0 %
Lymphocytes Relative: 17 %
Lymphs Abs: 1.7 10*3/uL (ref 0.7–4.0)
MCH: 31 pg (ref 26.0–34.0)
MCHC: 32.4 g/dL (ref 30.0–36.0)
MCV: 95.6 fL (ref 80.0–100.0)
Monocytes Absolute: 0.9 10*3/uL (ref 0.1–1.0)
Monocytes Relative: 10 %
Neutro Abs: 6.2 10*3/uL (ref 1.7–7.7)
Neutrophils Relative %: 64 %
Platelets: 270 10*3/uL (ref 150–400)
RBC: 3.39 MIL/uL — ABNORMAL LOW (ref 4.22–5.81)
RDW: 16.9 % — ABNORMAL HIGH (ref 11.5–15.5)
WBC: 9.7 10*3/uL (ref 4.0–10.5)
nRBC: 0 % (ref 0.0–0.2)

## 2020-11-27 LAB — BASIC METABOLIC PANEL
Anion gap: 19 — ABNORMAL HIGH (ref 5–15)
BUN: 85 mg/dL — ABNORMAL HIGH (ref 8–23)
CO2: 20 mmol/L — ABNORMAL LOW (ref 22–32)
Calcium: 8.6 mg/dL — ABNORMAL LOW (ref 8.9–10.3)
Chloride: 101 mmol/L (ref 98–111)
Creatinine, Ser: 14.82 mg/dL — ABNORMAL HIGH (ref 0.61–1.24)
GFR, Estimated: 3 mL/min — ABNORMAL LOW (ref >60)
Glucose, Bld: 88 mg/dL (ref 70–99)
Potassium: 6.3 mmol/L (ref 3.5–5.1)
Sodium: 140 mmol/L (ref 135–145)

## 2020-11-27 LAB — BLOOD GAS, VENOUS
Acid-Base Excess: 2.6 mmol/L — ABNORMAL HIGH (ref 0.0–2.0)
Bicarbonate: 26.9 mmol/L (ref 20.0–28.0)
FIO2: 21
O2 Saturation: 45.9 %
Patient temperature: 36.4
pCO2, Ven: 42.5 mmHg — ABNORMAL LOW (ref 44.0–60.0)
pH, Ven: 7.415 (ref 7.250–7.430)
pO2, Ven: <31 mmHg — CL (ref 32.0–45.0)

## 2020-11-27 LAB — SARS CORONAVIRUS 2 BY RT PCR (HOSPITAL ORDER, PERFORMED IN ~~LOC~~ HOSPITAL LAB): SARS Coronavirus 2: NEGATIVE

## 2020-11-27 LAB — TROPONIN I (HIGH SENSITIVITY)
Troponin I (High Sensitivity): 362 ng/L (ref <18)
Troponin I (High Sensitivity): 324 ng/L (ref <18)

## 2020-11-27 LAB — BRAIN NATRIURETIC PEPTIDE: B Natriuretic Peptide: 1733.7 pg/mL — ABNORMAL HIGH (ref 0.0–100.0)

## 2020-11-27 MED ORDER — HEPARIN SODIUM (PORCINE) 1000 UNIT/ML DIALYSIS
20.0000 [IU]/kg | INTRAMUSCULAR | Status: DC | PRN
  Filled 2020-11-27: qty 2

## 2020-11-27 MED ORDER — LANTHANUM CARBONATE 500 MG PO CHEW
1000.0000 mg | CHEWABLE_TABLET | Freq: Three times a day (TID) | ORAL | Status: DC
  Administered 2020-11-28 (×2): 1000 mg via ORAL
  Filled 2020-11-27 (×5): qty 2

## 2020-11-27 MED ORDER — METHYLPREDNISOLONE SODIUM SUCC 125 MG IJ SOLR
125.0000 mg | Freq: Once | INTRAMUSCULAR | Status: AC
  Administered 2020-11-27: 125 mg via INTRAVENOUS
  Filled 2020-11-27: qty 2

## 2020-11-27 MED ORDER — HEPARIN SODIUM (PORCINE) 1000 UNIT/ML DIALYSIS
1000.0000 [IU] | INTRAMUSCULAR | Status: DC | PRN
  Filled 2020-11-27: qty 1

## 2020-11-27 MED ORDER — SODIUM CHLORIDE 0.9% FLUSH
3.0000 mL | Freq: Two times a day (BID) | INTRAVENOUS | Status: DC
  Administered 2020-11-27 – 2020-11-28 (×2): 3 mL via INTRAVENOUS

## 2020-11-27 MED ORDER — PANTOPRAZOLE SODIUM 40 MG PO TBEC
40.0000 mg | DELAYED_RELEASE_TABLET | Freq: Every day | ORAL | Status: DC
  Administered 2020-11-28: 40 mg via ORAL
  Filled 2020-11-27: qty 1

## 2020-11-27 MED ORDER — SODIUM CHLORIDE 0.9% FLUSH: 3.0000 mL | INTRAVENOUS | Status: DC | PRN

## 2020-11-27 MED ORDER — ACETAMINOPHEN 325 MG PO TABS: 650.0000 mg | ORAL_TABLET | ORAL | Status: DC | PRN

## 2020-11-27 MED ORDER — SODIUM ZIRCONIUM CYCLOSILICATE 10 G PO PACK
10.0000 g | PACK | Freq: Every day | ORAL | Status: DC
  Administered 2020-11-27 – 2020-11-28 (×2): 10 g via ORAL
  Filled 2020-11-27 (×2): qty 1

## 2020-11-27 MED ORDER — ALBUTEROL SULFATE HFA 108 (90 BASE) MCG/ACT IN AERS
4.0000 | INHALATION_SPRAY | Freq: Once | RESPIRATORY_TRACT | Status: AC
  Administered 2020-11-27: 4 via RESPIRATORY_TRACT
  Filled 2020-11-27: qty 6.7

## 2020-11-27 MED ORDER — HEPARIN SODIUM (PORCINE) 5000 UNIT/ML IJ SOLN
5000.0000 [IU] | Freq: Three times a day (TID) | INTRAMUSCULAR | Status: DC
  Administered 2020-11-27 – 2020-11-28 (×3): 5000 [IU] via SUBCUTANEOUS
  Filled 2020-11-27 (×4): qty 1

## 2020-11-27 MED ORDER — CHLORHEXIDINE GLUCONATE CLOTH 2 % EX PADS: 6.0000 | MEDICATED_PAD | Freq: Every day | CUTANEOUS | Status: DC

## 2020-11-27 MED ORDER — LIDOCAINE-PRILOCAINE 2.5-2.5 % EX CREA
1.0000 "application " | TOPICAL_CREAM | CUTANEOUS | Status: DC | PRN
  Filled 2020-11-27: qty 5

## 2020-11-27 MED ORDER — NITROGLYCERIN 0.4 MG SL SUBL
0.4000 mg | SUBLINGUAL_TABLET | Freq: Once | SUBLINGUAL | Status: AC
  Administered 2020-11-27: 0.4 mg via SUBLINGUAL
  Filled 2020-11-27: qty 1

## 2020-11-27 MED ORDER — AMLODIPINE BESYLATE 5 MG PO TABS
5.0000 mg | ORAL_TABLET | Freq: Every day | ORAL | Status: DC
  Administered 2020-11-28: 5 mg via ORAL
  Filled 2020-11-27: qty 1

## 2020-11-27 MED ORDER — PENTAFLUOROPROP-TETRAFLUOROETH EX AERO
1.0000 "application " | INHALATION_SPRAY | CUTANEOUS | Status: DC | PRN
  Filled 2020-11-27: qty 116

## 2020-11-27 MED ORDER — ONDANSETRON HCL 4 MG/2ML IJ SOLN: 4.0000 mg | Freq: Four times a day (QID) | INTRAMUSCULAR | Status: DC | PRN

## 2020-11-27 MED ORDER — MAGNESIUM SULFATE 2 GM/50ML IV SOLN
2.0000 g | Freq: Once | INTRAVENOUS | Status: AC
  Administered 2020-11-27: 2 g via INTRAVENOUS
  Filled 2020-11-27: qty 50

## 2020-11-27 MED ORDER — DIPHENHYDRAMINE-APAP (SLEEP) 25-500 MG PO TABS: 2.0000 | ORAL_TABLET | Freq: Every evening | ORAL | Status: DC | PRN

## 2020-11-27 MED ORDER — FAMOTIDINE 20 MG PO TABS: 20.0000 mg | ORAL_TABLET | Freq: Two times a day (BID) | ORAL | Status: DC | PRN

## 2020-11-27 MED ORDER — MORPHINE SULFATE (PF) 4 MG/ML IV SOLN
4.0000 mg | Freq: Once | INTRAVENOUS | Status: AC
  Administered 2020-11-27: 4 mg via INTRAVENOUS
  Filled 2020-11-27: qty 1

## 2020-11-27 MED ORDER — HYDRALAZINE HCL 20 MG/ML IJ SOLN: 5.0000 mg | INTRAMUSCULAR | Status: DC | PRN

## 2020-11-27 MED ORDER — ATORVASTATIN CALCIUM 80 MG PO TABS
80.0000 mg | ORAL_TABLET | Freq: Every day | ORAL | Status: DC
  Administered 2020-11-27: 80 mg via ORAL
  Filled 2020-11-27: qty 1

## 2020-11-27 MED ORDER — CLOPIDOGREL BISULFATE 75 MG PO TABS
75.0000 mg | ORAL_TABLET | Freq: Every day | ORAL | Status: DC
  Administered 2020-11-28: 75 mg via ORAL
  Filled 2020-11-27: qty 1

## 2020-11-27 MED ORDER — LIDOCAINE HCL (PF) 1 % IJ SOLN: 5.0000 mL | INTRAMUSCULAR | Status: DC | PRN

## 2020-11-27 MED ORDER — METOPROLOL SUCCINATE ER 25 MG PO TB24
25.0000 mg | ORAL_TABLET | Freq: Every day | ORAL | Status: DC
  Administered 2020-11-28: 25 mg via ORAL
  Filled 2020-11-27: qty 1

## 2020-11-27 MED ORDER — ALTEPLASE 2 MG IJ SOLR: 2.0000 mg | Freq: Once | INTRAMUSCULAR | Status: DC | PRN

## 2020-11-27 MED ORDER — FUROSEMIDE 10 MG/ML IJ SOLN
40.0000 mg | Freq: Once | INTRAMUSCULAR | Status: AC
  Administered 2020-11-27: 40 mg via INTRAVENOUS
  Filled 2020-11-27: qty 4

## 2020-11-27 MED ORDER — ISOSORBIDE MONONITRATE ER 60 MG PO TB24
90.0000 mg | ORAL_TABLET | Freq: Every day | ORAL | Status: DC
  Administered 2020-11-28: 90 mg via ORAL
  Filled 2020-11-27: qty 1

## 2020-11-27 MED ORDER — ASPIRIN EC 81 MG PO TBEC
81.0000 mg | DELAYED_RELEASE_TABLET | Freq: Every day | ORAL | Status: DC
  Administered 2020-11-28: 81 mg via ORAL
  Filled 2020-11-27: qty 1

## 2020-11-27 MED ORDER — SODIUM CHLORIDE 0.9 % IV SOLN: 100.0000 mL | INTRAVENOUS | Status: DC | PRN

## 2020-11-27 MED ORDER — SODIUM CHLORIDE 0.9 % IV SOLN: 250.0000 mL | INTRAVENOUS | Status: DC | PRN

## 2020-11-27 NOTE — ED Triage Notes (Addendum)
Arrived via EMS; c/o shortness of breath, and initial Sp02 was 65% on room air; NRBM placed by EMS, present on arrival, Sp02 with good pleth at 100%, replaced with 4L via [place]. Patient has increased workof breathing, reports HD T/TH/Sat.last session on Saturday. +wheezing on right side,

## 2020-11-27 NOTE — Progress Notes (Signed)
Patient arrived to unit via wheelchair, requested a snack and RN gave him a [place] sandwich. Patient has no complaints, labs were just drawn. Will continue to monitor.

## 2020-11-27 NOTE — ED Notes (Signed)
To HD

## 2020-11-27 NOTE — Progress Notes (Addendum)
[facility] Progress Note  Patient recently admitted 11/19/20-11/22/20 with volume overload. Presented to [facility] again today with shortness of breath. SpO2 was 65% on RA, improved to 100% on 4L [place]. K+ 6.3, BUN 84, Cr 14.82 and BNP elevated. CXR with stable bilateral airspace opacities. Nephrology consulted for dialysis.   Of note, patient has been shortening his dialysis treatments since discharge, completing 3 hours on 1/29 and leaving 1.2Kg above his EDW. He did not go to dialysis today.   Suspect recurrent pulmonary edema and hyperkalemia due to inadequate dialysis. Will arrange for inpatient dialysis here today. Plan to return to the ED after dialysis and hopefully discharge if SOB is resolved.   Anice Paganini, PA-C 11/27/2020, 2:12 PM  [facility]  I have seen and examined this patient and agree with plan and assessment in the above note with renal recommendations/intervention highlighted.  Hopefully can be discharged to his home HD unit after HD today where he can get a full dialysis treatment. Governor Rooks Pricella Gaugh,MD 11/27/2020 2:47 PM

## 2020-11-27 NOTE — H&P (Signed)
History and Physical    Kenneth [name] [name] DOB: [birth year] DOA: 11/27/2020  PCP: [name], MD (Confirm with patient/family/NH records and if not entered, this has to be entered at [facility] point of entry) Patient coming from: Home  I have personally briefly reviewed patient's old medical records in [place]  Chief Complaint: SOB  HPI: Kenneth [name] is a 65 y.o. male with medical history significant of ESRD on HD TTS, multivessel CAD diagnosed recently but considered to be a poor candidate for PCI/CABG and recommend medical management only (July 2021), PEA rest in 2019, chronic combined systolic and diastolic CHF, paroxysmal A. fib, HTN, presented with increasing shortness of breath for 2 days.  Patient claimed he never missed his HD session since the last session was on Saturday which was completed.  Since yesterday patient started to feel increasing shortness of breath, overnighted become constant and with significant orthopnea could not lie down.  Denied any cough, no chest pain no fever chills. ED Course: Chest reported that multifocal pneumonia, but fluid overload after correction based on clinical evidence. Trop 362>169, BNP 1733, potassium 6.3, BUN 85, creatinine 14.8.  Review of Systems: As per HPI otherwise 14 point review of systems negative.    Past Medical History:  Diagnosis Date  . Acute CHF ([place]) 01/2018  . Cardiomyopathy secondary    likely related to HTN heart disease; possibly ETOH related as well  . Chronic combined systolic and diastolic heart failure (HCC)    Echocardiogram 09/22/11: Moderate LVH, EF [id number], grade 3 diastolic dysfunction, mild MR, moderate to severe LAE, mild RVE, mild to moderate TR, small to moderate pericardial effusion  . Coronary artery disease    not felt to be candidate for CABG 08/2018; medical thearpy recomended due to high risk of PCI   . Dysrhythmia    aflutter 04/2016, afib 02/2018, not felt to be a candidate for  anticoagulation due to non-compliance and ETOH  . ESRD (end stage renal disease) on dialysis ([place])    due to hypertensive nephrosclerosis; TTS; [address] (09/01/2018)  . History of alcohol abuse   . Hypertension   . Myocardial infarction [facility])    " mild " per daughter  . PEA (Pulseless electrical activity) ([place])    PEA arrest 03/09/18, treated empirically for hyperkalemia, shock x1 for WCT, given amiodarone, ROSC after 10 min of ACLS    Past Surgical History:  Procedure Laterality Date  . AV FISTULA PLACEMENT Left 05/14/2016   Procedure: LEFT ARM BASILIC VEIN TRANSPOSITION;  Surgeon: Rosetta Posner, MD;  Location: [place];  Service: Vascular;  Laterality: Left;  . INSERTION OF DIALYSIS CATHETER N/A 06/20/2019   Procedure: INSERTION OF TUNNELED  DIALYSIS CATHETER;  Surgeon: Elam Dutch, MD;  Location: [place];  Service: Vascular;  Laterality: N/A;  . LEFT HEART CATH AND CORONARY ANGIOGRAPHY N/A 09/02/2018   Procedure: LEFT HEART CATH AND CORONARY ANGIOGRAPHY;  Surgeon: Nelva Bush, MD;  Location: [place] [facility];  Service: Cardiovascular;  Laterality: N/A;  . LEFT HEART CATH AND CORONARY ANGIOGRAPHY N/A 05/14/2020   Procedure: LEFT HEART CATH AND CORONARY ANGIOGRAPHY;  Surgeon: Sherren Mocha, MD;  Location: [place] [facility];  Service: Cardiovascular;  Laterality: N/A;  . PERIPHERAL VASCULAR CATHETERIZATION N/A 05/13/2016   Procedure: Dialysis/Perma Catheter Insertion;  Surgeon: Serafina Mitchell, MD;  Location: [place] [facility];  Service: Cardiovascular;  Laterality: N/A;  . REVISION OF ARTERIOVENOUS GORETEX GRAFT Left 0000000   Procedure: PLICATION  OF ARTERIOVENOUS FISTULA LEFT ARM;  Surgeon: Elam Dutch, MD;  Location: [place];  Service: Vascular;  Laterality: Left;     reports that he quit smoking about 3 years ago. His smoking use included cigars. He has a 96.00 pack-year smoking history. He has quit using smokeless tobacco.  His smokeless tobacco use included chew. He  reports previous alcohol use. He reports that he does not use drugs.  No Known Allergies  Family History  Problem Relation Age of Onset  . Emphysema Mother   . Cirrhosis Father      Prior to Admission medications   Medication Sig Start Date End Date Taking? Authorizing Provider  amLODipine (NORVASC) 10 MG tablet Take 0.5 tablets (5 mg total) by mouth daily. 10/20/20   Kathie Dike, MD  aspirin EC 81 MG EC tablet Take 1 tablet (81 mg total) by mouth daily. Swallow whole. 05/16/20   Alma Friendly, MD  atorvastatin (LIPITOR) 80 MG tablet Take 1 tablet (80 mg total) by mouth daily. Patient taking differently: Take 80 mg by mouth at bedtime. 10/09/20   Erlene Quan, PA-C  B Complex-C-Folic Acid (RENA-VITE RX) 1 MG TABS Take 1 tablet by mouth daily. 10/05/20   [provider]  clopidogrel (PLAVIX) 75 MG tablet Take 1 tablet (75 mg total) by mouth daily. Patient taking differently: Take 75 mg by mouth every evening. 10/09/20   Kilroy, Doreene Burke, PA-C  diphenhydramine-acetaminophen (TYLENOL PM EXTRA STRENGTH) 25-500 MG TABS tablet Take 2 tablets by mouth at bedtime as needed (pain). 05/12/18   [provider]  famotidine (PEPCID) 20 MG tablet Take 1 tablet (20 mg total) by mouth 2 (two) times daily as needed for heartburn. 10/28/20   Vashti Hey, MD  isosorbide mononitrate (IMDUR) 30 MG 24 hr tablet Take 3 tablets (90 mg total) by mouth daily. 10/29/20 11/28/20  Vashti Hey, MD  lanthanum (FOSRENOL) 1000 MG chewable tablet Chew 1,000 mg by mouth 3 (three) times daily. 08/23/20   [provider]  metoprolol succinate (TOPROL-XL) 25 MG 24 hr tablet Take 25 mg by mouth in the morning and at bedtime.    [provider]  nitroGLYCERIN (NITROSTAT) 0.4 MG SL tablet Place 1 tablet (0.4 mg total) under the tongue every 5 (five) minutes as needed for chest pain (max 3 doses). 10/13/20   Truddie Hidden, MD  pantoprazole (PROTONIX) 40 MG  tablet Take 1 tablet (40 mg total) by mouth daily at 6 (six) AM. 10/29/20 11/28/20  Vashti Hey, MD  VITAMIN D PO Take 1 tablet by mouth daily. 11/15/20 11/14/21  [provider]    Physical Exam: Vitals:   11/27/20 0927 11/27/20 0928 11/27/20 1111 11/27/20 1151  BP: (!) 168/94  (!) 146/80 (!) 164/95  Pulse: 78  78 74  Resp: (!) 26  (!) 22 19  Temp: 98.1 F (36.7 C)   98.1 F (36.7 C)  TempSrc: Oral   Oral  SpO2: 97%  98% 98%  Weight:  90.7 kg    Height:  '5\' 11"'$  (1.803 m)      Constitutional: NAD, calm, comfortable Vitals:   11/27/20 0927 11/27/20 0928 11/27/20 1111 11/27/20 1151  BP: (!) 168/94  (!) 146/80 (!) 164/95  Pulse: 78  78 74  Resp: (!) 26  (!) 22 19  Temp: 98.1 F (36.7 C)   98.1 F (36.7 C)  TempSrc: Oral   Oral  SpO2: 97%  98% 98%  Weight:  90.7  kg    Height:  '5\' 11"'$  (1.803 m)     Eyes: PERRL, lids and conjunctivae normal ENMT: Mucous membranes are moist. Posterior pharynx clear of any exudate or lesions.Normal dentition.  Neck: normal, supple, no masses, no thyromegaly, JVD to 7 cm above clavicles Respiratory: clear to auscultation bilaterally, no wheezing, Fine crackles to mid levels bilaterally.  Increasing l respiratory effort. No accessory muscle use.  Cardiovascular: Regular rate and rhythm, no murmurs / rubs / gallops. No extremity edema. 2+ pedal pulses. No carotid bruits.  Abdomen: no tenderness, no masses palpated. No hepatosplenomegaly. Bowel sounds positive.  Musculoskeletal: no clubbing / cyanosis. No joint deformity upper and lower extremities. Good ROM, no contractures. Normal muscle tone.  Skin: no rashes, lesions, ulcers. No induration Neurologic: CN 2-12 grossly intact. Sensation intact, DTR normal. Strength 5/5 in all 4.  Psychiatric: Normal judgment and insight. Alert and oriented x 3. Normal mood.     Labs on Admission: I have personally reviewed following labs and imaging studies  CBC: Recent Labs  Lab  11/21/20 0101 11/27/20 0937  WBC 6.4 9.7  NEUTROABS  --  6.2  HGB 10.4* 10.5*  HCT 30.0* 32.4*  MCV 93.8 95.6  PLT 222 [id number]   Basic Metabolic Panel: Recent Labs  Lab 11/21/20 0101 11/27/20 0937  NA 139 140  K 4.1 6.3*  CL 97* 101  CO2 27 20*  GLUCOSE 114* 88  BUN 45* 85*  CREATININE 10.24* 14.82*  CALCIUM 8.1* 8.6*   GFR: Estimated Creatinine Clearance: 5.7 mL/min (A) (by C-G formula based on SCr of 14.82 mg/dL (H)). Liver Function Tests: No results for input(s): AST, ALT, ALKPHOS, BILITOT, PROT, ALBUMIN in the last 168 hours. No results for input(s): LIPASE, AMYLASE in the last 168 hours. No results for input(s): AMMONIA in the last 168 hours. Coagulation Profile: No results for input(s): INR, PROTIME in the last 168 hours. Cardiac Enzymes: No results for input(s): CKTOTAL, CKMB, CKMBINDEX, TROPONINI in the last 168 hours. BNP (last 3 results) No results for input(s): PROBNP in the last 8760 hours. HbA1C: No results for input(s): HGBA1C in the last 72 hours. CBG: No results for input(s): GLUCAP in the last 168 hours. Lipid Profile: No results for input(s): CHOL, HDL, LDLCALC, TRIG, CHOLHDL, LDLDIRECT in the last 72 hours. Thyroid Function Tests: No results for input(s): TSH, T4TOTAL, FREET4, T3FREE, THYROIDAB in the last 72 hours. Anemia Panel: No results for input(s): VITAMINB12, FOLATE, FERRITIN, TIBC, IRON, RETICCTPCT in the last 72 hours. Urine analysis:    Component Value Date/Time   COLORURINE YELLOW 05/12/2016 0834   APPEARANCEUR CLOUDY (A) 05/12/2016 0834   LABSPEC 1.014 05/12/2016 0834   PHURINE 5.0 05/12/2016 0834   GLUCOSEU NEGATIVE 05/12/2016 0834   HGBUR MODERATE (A) 05/12/2016 0834   BILIRUBINUR NEGATIVE 05/12/2016 0834   KETONESUR NEGATIVE 05/12/2016 0834   PROTEINUR 100 (A) 05/12/2016 0834   UROBILINOGEN 0.2 12/09/2013 0013   NITRITE NEGATIVE 05/12/2016 0834   LEUKOCYTESUR NEGATIVE 05/12/2016 0834    Radiological Exams on Admission: DG  Chest Port 1 View  Result Date: 11/27/2020 CLINICAL DATA:  Shortness of breath. EXAM: PORTABLE CHEST 1 VIEW COMPARISON:  November 19, 2020. FINDINGS: Stable cardiomediastinal silhouette. No pneumothorax or pleural effusion is noted. Stable bilateral patchy airspace opacities are noted consistent with multifocal pneumonia. Bony thorax is unremarkable. IMPRESSION: Stable bilateral multifocal pneumonia. Electronically Signed   By: Marijo Conception M.D.   On: 11/27/2020 09:56    EKG: Independently reviewed.  Similar T  wave changes as before  Assessment/Plan Active Problems:   CHF (congestive heart failure) ([place])  (please populate well all problems here in Problem List. (For example, if patient is on BP meds at home and you resume or decide to hold them, it is a problem that needs to be her. Same for CAD, COPD, HLD and so on)  Acute on chronic diastolic CHF decompensation -Blood pressure significantly elevated as well as significant signs of fluid overload JVD and crackles in the lungs. -Lasix x1, morphine x1 -Emergency dialysis. -Echo  Hyperkalemia -Lasix, Lokelma x1, emergency dialysis.  Positive troponins -Likely demand ischemia from CHF and fluid overload -Multivessel CAD documented in July 2021, but considered to be a poor candidate for either PCI or CABG as per cardiology, who recommended medical management alone. -Echocardiogram.  Paroxysmal A. Fib -Due to cardiology record, not on anticoagulation because of noncompliance.  DVT prophylaxis: Heparin subcu  code Status: Full Code Family Communication: none at bedside Disposition Plan: Expect 1 to 2 days hospital stay to treat CHF decompensation. Consults called: Nephrology Admission status: Admission telemetry   Lequita Halt MD [facility] Pager [phone]  11/27/2020, 2:30 PM

## 2020-11-27 NOTE — ED Notes (Signed)
Dinner Tray Ordered @ 1651. 

## 2020-11-27 NOTE — ED Notes (Signed)
Report given to floor Burman Riis, RN.

## 2020-11-27 NOTE — Plan of Care (Signed)
°  Problem: Education: °Goal: Knowledge of General Education information will improve °Description: Including pain rating scale, medication(s)/side effects and non-pharmacologic comfort measures °Outcome: Progressing °  °Problem: Health Behavior/Discharge Planning: °Goal: Ability to manage health-related needs will improve °Outcome: Progressing °  °Problem: Activity: °Goal: Risk for activity intolerance will decrease °Outcome: Progressing °  °Problem: Clinical Measurements: °Goal: Respiratory complications will improve °Outcome: Progressing °  °

## 2020-11-27 NOTE — ED Provider Notes (Signed)
[facility] EMERGENCY DEPARTMENT Provider Note   CSN: [id number] Arrival date & time: 11/27/20  0915     History Chief Complaint  Patient presents with  . Shortness of Breath    Kenneth [name] is a 65 y.o. male.  multivessel CAD-not felt to be a good candidate for PCI/CABG and medically managed, PEA arrest in 2019, chronic combined systolic and diastolic heart failure, paroxysmal A. fib, ESRD on HD TTS, hypertension.  Presents to ER with shortness of breath.  Patient reports symptoms started yesterday at night and became worse this morning.  Had some tightness in the chest initially but no ongoing pain at present.  No new cough, no fever.  Feels similar to prior episode that occurred 1 week ago.  States that he went to dialysis and received a full session on Saturday.  Per review of chart, had admission for respiratory failure felt likely due to heart failure exacerbation, fluid overload state.  Had nephrology, cardiology consultation.  HPI     Past Medical History:  Diagnosis Date  . Acute CHF ([place]) 01/2018  . Cardiomyopathy secondary    likely related to HTN heart disease; possibly ETOH related as well  . Chronic combined systolic and diastolic heart failure (HCC)    Echocardiogram 09/22/11: Moderate LVH, EF [id number], grade 3 diastolic dysfunction, mild MR, moderate to severe LAE, mild RVE, mild to moderate TR, small to moderate pericardial effusion  . Coronary artery disease    not felt to be candidate for CABG 08/2018; medical thearpy recomended due to high risk of PCI   . Dysrhythmia    aflutter 04/2016, afib 02/2018, not felt to be a candidate for anticoagulation due to non-compliance and ETOH  . ESRD (end stage renal disease) on dialysis ([place])    due to hypertensive nephrosclerosis; TTS; [address] (09/01/2018)  . History of alcohol abuse   . Hypertension   . Myocardial infarction [facility])    " mild " per daughter  . PEA (Pulseless electrical activity) ([place])     PEA arrest 03/09/18, treated empirically for hyperkalemia, shock x1 for WCT, given amiodarone, ROSC after 10 min of ACLS    Patient Active Problem List   Diagnosis Date Noted  . Heart failure ([place]) 11/27/2020  . Fluid overload 11/22/2020  . Volume overload 11/19/2020  . Chest pain 10/27/2020  . PAF (paroxysmal atrial fibrillation) ([place]) 10/27/2020  . Troponin level elevated   . Angina at rest [facility]) 09/24/2020  . Accelerated hypertension 09/24/2020  . Dyslipidemia 09/24/2020  . NSTEMI (non-ST elevated myocardial infarction) ([place]) 05/12/2020  . Non-ST elevated myocardial infarction ([place]) 05/12/2020  . DNR (do not resuscitate) discussion   . Palliative care by specialist   . Goals of care, counseling/discussion   . CAD (coronary artery disease)   . STEMI (ST elevation myocardial infarction) ([place]) 09/17/2018  . Unstable angina ([place])   . ACS (acute coronary syndrome) ([place]) 09/01/2018  . ESRD (end stage renal disease) ([place]) 07/20/2018  . Acute hypoxemic respiratory failure ([place]) 05/11/2018  . Cardiac arrest ([place]) 03/09/2018  . Acute encephalopathy   . Acute on chronic respiratory failure ([place]) 02/16/2018  . Tobacco dependence 02/16/2018  . Pulmonary edema 01/26/2018  . Acute respiratory failure with hypoxia ([place]) 01/26/2018  . Atrial fibrillation and flutter ([place])   . Shortness of breath   . Acute on chronic diastolic CHF (congestive heart failure) ([place])   . Hypothermia 10/08/2016  . ESRD on hemodialysis ([place]) 10/08/2016  . Fall 10/08/2016  .  Syncope 10/07/2016  . Near syncope 07/07/2016  . Cardiomyopathy ([place]) 07/07/2016  . Ventricular tachycardia, non-sustained ([place]) 07/06/2016  . Hypoglycemia 07/06/2016  . CKD (chronic kidney disease) stage 5, GFR less than 15 ml/min (HCC)   . Alcoholism ([place]) 05/13/2016  . Hypertensive emergency 12/08/2013  . Cardiomyopathy secondary 10/09/2011  . Essential hypertension 10/09/2011  . Alcohol withdrawal delirium ([place]) 09/25/2011  .  Chronic diastolic CHF (congestive heart failure) ([place]) 09/24/2011    Past Surgical History:  Procedure Laterality Date  . AV FISTULA PLACEMENT Left 05/14/2016   Procedure: LEFT ARM BASILIC VEIN TRANSPOSITION;  Surgeon: Rosetta Posner, MD;  Location: [facility];  Service: Vascular;  Laterality: Left;  . INSERTION OF DIALYSIS CATHETER N/A 06/20/2019   Procedure: INSERTION OF TUNNELED  DIALYSIS CATHETER;  Surgeon: Elam Dutch, MD;  Location: [facility];  Service: Vascular;  Laterality: N/A;  . LEFT HEART CATH AND CORONARY ANGIOGRAPHY N/A 09/02/2018   Procedure: LEFT HEART CATH AND CORONARY ANGIOGRAPHY;  Surgeon: Nelva Bush, MD;  Location: [facility] CV LAB;  Service: Cardiovascular;  Laterality: N/A;  . LEFT HEART CATH AND CORONARY ANGIOGRAPHY N/A 05/14/2020   Procedure: LEFT HEART CATH AND CORONARY ANGIOGRAPHY;  Surgeon: Sherren Mocha, MD;  Location: [facility] CV LAB;  Service: Cardiovascular;  Laterality: N/A;  . PERIPHERAL VASCULAR CATHETERIZATION N/A 05/13/2016   Procedure: Dialysis/Perma Catheter Insertion;  Surgeon: Serafina Mitchell, MD;  Location: [facility] CV LAB;  Service: Cardiovascular;  Laterality: N/A;  . REVISION OF ARTERIOVENOUS GORETEX GRAFT Left 0000000   Procedure: PLICATION OF ARTERIOVENOUS FISTULA LEFT ARM;  Surgeon: Elam Dutch, MD;  Location: [facility] OR;  Service: Vascular;  Laterality: Left;       Family History  Problem Relation Age of Onset  . Emphysema Mother   . Cirrhosis Father     Social History   Tobacco Use  . Smoking status: Former Smoker    Packs/day: 2.00    Years: 48.00    Pack years: 96.00    Types: Cigars    Quit date: 10/27/2017    Years since quitting: 3.0  . Smokeless tobacco: Former Systems developer    Types: Secondary school teacher  . Vaping Use: Never used  Substance Use Topics  . Alcohol use: Not Currently    Comment: h/o very heavy use, quit about 2019  . Drug use: No    Home Medications Prior to Admission medications   Medication Sig Start  Date End Date Taking? Authorizing Provider  amLODipine (NORVASC) 10 MG tablet Take 0.5 tablets (5 mg total) by mouth daily. 10/20/20  Yes Kathie Dike, MD  aspirin EC 81 MG EC tablet Take 1 tablet (81 mg total) by mouth daily. Swallow whole. 05/16/20  Yes Alma Friendly, MD  atorvastatin (LIPITOR) 80 MG tablet Take 1 tablet (80 mg total) by mouth daily. Patient taking differently: Take 80 mg by mouth at bedtime. 10/09/20  Yes Kilroy, Luke K, PA-C  B Complex-C-Folic Acid (RENA-VITE RX) 1 MG TABS Take 1 tablet by mouth daily. 10/05/20  Yes [provider]  clopidogrel (PLAVIX) 75 MG tablet Take 1 tablet (75 mg total) by mouth daily. Patient taking differently: Take 75 mg by mouth every evening. 10/09/20  Yes Kilroy, Luke K, PA-C  diphenhydramine-acetaminophen (TYLENOL PM EXTRA STRENGTH) 25-500 MG TABS tablet Take 2 tablets by mouth at bedtime as needed (pain). 05/12/18  Yes [provider]  famotidine (PEPCID) 20 MG tablet Take 1 tablet (20 mg total) by mouth 2 (two)  times daily as needed for heartburn. 10/28/20  Yes Vashti Hey, MD  isosorbide mononitrate (IMDUR) 30 MG 24 hr tablet Take 3 tablets (90 mg total) by mouth daily. 10/29/20 11/28/20 Yes Vashti Hey, MD  lanthanum (FOSRENOL) 1000 MG chewable tablet Chew 1,000 mg by mouth 3 (three) times daily. 08/23/20  Yes [provider]  metoprolol succinate (TOPROL-XL) 25 MG 24 hr tablet Take 25 mg by mouth in the morning and at bedtime.   Yes [provider]  nitroGLYCERIN (NITROSTAT) 0.4 MG SL tablet Place 1 tablet (0.4 mg total) under the tongue every 5 (five) minutes as needed for chest pain (max 3 doses). 10/13/20  Yes Truddie Hidden, MD  pantoprazole (PROTONIX) 40 MG tablet Take 1 tablet (40 mg total) by mouth daily at 6 (six) AM. 10/29/20 11/28/20 Yes Vashti Hey, MD  VITAMIN D PO Take 1 tablet by mouth daily. 11/15/20 11/14/21 Yes [provider]    Allergies     Patient has no known allergies.  Review of Systems   Review of Systems  Constitutional: Negative for chills and fever.  HENT: Negative for ear pain and sore throat.   Eyes: Negative for pain and visual disturbance.  Respiratory: Positive for chest tightness and shortness of breath. Negative for cough.   Cardiovascular: Positive for chest pain. Negative for palpitations.  Gastrointestinal: Negative for abdominal pain and vomiting.  Genitourinary: Negative for dysuria and hematuria.  Musculoskeletal: Negative for arthralgias and back pain.  Skin: Negative for color change and rash.  Neurological: Negative for seizures and syncope.  All other systems reviewed and are negative.   Physical Exam Updated Vital Signs BP 134/66 (BP Location: Right Arm)   Pulse 81   Temp 98 F (36.7 C) (Oral)   Resp 16   Ht 6' (1.829 m)   Wt 84.4 kg   SpO2 96%   BMI 25.23 kg/m   Physical Exam Vitals and nursing note reviewed.  Constitutional:      Appearance: He is well-developed and well-nourished.  HENT:     Head: Normocephalic and atraumatic.  Eyes:     Conjunctiva/sclera: Conjunctivae normal.  Cardiovascular:     Rate and Rhythm: Normal rate and regular rhythm.     Heart sounds: No murmur heard.   Pulmonary:     Effort: Pulmonary effort is normal. No respiratory distress.     Breath sounds: Normal breath sounds.     Comments: Some tachypnea but speaking in full sentences, somewhat diminished at bases, expiratory wheeze bilaterally Abdominal:     Palpations: Abdomen is soft.     Tenderness: There is no abdominal tenderness.  Musculoskeletal:        General: No edema.     Cervical back: Neck supple.     Right lower leg: No edema.     Left lower leg: No edema.  Skin:    General: Skin is warm and dry.  Neurological:     General: No focal deficit present.     Mental Status: He is alert.  Psychiatric:        Mood and Affect: Mood and affect normal.     ED Results / Procedures /  Treatments   Labs (all labs ordered are listed, but only abnormal results are displayed) Labs Reviewed  BASIC METABOLIC PANEL - Abnormal; Notable for the following components:      Result Value   Potassium 6.3 (*)    CO2 20 (*)    BUN 85 (*)  Creatinine, Ser 14.82 (*)    Calcium 8.6 (*)    GFR, Estimated 3 (*)    Anion gap 19 (*)    All other components within normal limits  CBC WITH DIFFERENTIAL/PLATELET - Abnormal; Notable for the following components:   RBC 3.39 (*)    Hemoglobin 10.5 (*)    HCT 32.4 (*)    RDW 16.9 (*)    Eosinophils Absolute 0.7 (*)    All other components within normal limits  BRAIN NATRIURETIC PEPTIDE - Abnormal; Notable for the following components:   B Natriuretic Peptide 1,733.7 (*)    All other components within normal limits  BLOOD GAS, VENOUS - Abnormal; Notable for the following components:   pCO2, Ven 42.5 (*)    pO2, Ven <31.0 (*)    Acid-Base Excess 2.6 (*)    All other components within normal limits  BASIC METABOLIC PANEL - Abnormal; Notable for the following components:   Chloride 96 (*)    Glucose, Bld 129 (*)    BUN 49 (*)    Creatinine, Ser 10.51 (*)    Calcium 8.8 (*)    GFR, Estimated 5 (*)    Anion gap 20 (*)    All other components within normal limits  RENAL FUNCTION PANEL - Abnormal; Notable for the following components:   Chloride 97 (*)    Glucose, Bld 144 (*)    BUN 47 (*)    Creatinine, Ser 10.34 (*)    Calcium 8.7 (*)    Phosphorus 6.5 (*)    Albumin 3.3 (*)    GFR, Estimated 5 (*)    Anion gap 19 (*)    All other components within normal limits  CBC - Abnormal; Notable for the following components:   RBC 4.17 (*)    HCT 38.1 (*)    RDW 16.5 (*)    All other components within normal limits  TROPONIN I (HIGH SENSITIVITY) - Abnormal; Notable for the following components:   Troponin I (High Sensitivity) 362 (*)    All other components within normal limits  TROPONIN I (HIGH SENSITIVITY) - Abnormal; Notable for  the following components:   Troponin I (High Sensitivity) 324 (*)    All other components within normal limits  SARS CORONAVIRUS 2 BY RT PCR [facility] ORDER, [facility])    EKG EKG Interpretation  Date/Time:  Tuesday November 27 2020 09:23:08 EST Ventricular Rate:  84 PR Interval:    QRS Duration: 103 QT Interval:  397 QTC Calculation: 470 R Axis:   85 Text Interpretation: Sinus rhythm Borderline prolonged PR interval Anterior infarct, old Abnormal T, consider ischemia, diffuse leads unchaged from prior ecg on Nov 20 2020, no acute STEMI Confirmed by Madalyn Rob [phone]) on 11/27/2020 9:24:34 AM   Radiology DG Chest Port 1 View  Result Date: 11/27/2020 CLINICAL DATA:  Shortness of breath. EXAM: PORTABLE CHEST 1 VIEW COMPARISON:  November 19, 2020. FINDINGS: Stable cardiomediastinal silhouette. No pneumothorax or pleural effusion is noted. Stable bilateral patchy airspace opacities are noted consistent with multifocal pneumonia. Bony thorax is unremarkable. IMPRESSION: Stable bilateral multifocal pneumonia. Electronically Signed   By: Marijo Conception M.D.   On: 11/27/2020 09:56    Procedures .Critical Care Performed by: Lucrezia Starch, MD Authorized by: Lucrezia Starch, MD   Critical care provider statement:    Critical care time (minutes):  40   Critical care was time spent personally by me on the following activities:  Discussions  with consultants, evaluation of patient's response to treatment, examination of patient, ordering and performing treatments and interventions, ordering and review of laboratory studies, ordering and review of radiographic studies, pulse oximetry, re-evaluation of patient's condition, obtaining history from patient or surrogate and review of old charts     Medications Ordered in ED Medications  Chlorhexidine Gluconate Cloth 2 % PADS 6 each (6 each Topical Not Given 11/28/20 0525)  pentafluoroprop-tetrafluoroeth  (GEBAUERS) aerosol 1 application (has no administration in time range)  lidocaine (PF) (XYLOCAINE) 1 % injection 5 mL (has no administration in time range)  lidocaine-prilocaine (EMLA) cream 1 application (has no administration in time range)  0.9 %  sodium chloride infusion (has no administration in time range)  0.9 %  sodium chloride infusion (has no administration in time range)  heparin injection 1,000 Units (has no administration in time range)  alteplase (CATHFLO ACTIVASE) injection 2 mg (has no administration in time range)  heparin injection 1,800 Units (has no administration in time range)  sodium zirconium cyclosilicate (LOKELMA) packet 10 g (10 g Oral Given 11/27/20 1601)  hydrALAZINE (APRESOLINE) injection 5 mg (has no administration in time range)  aspirin EC tablet 81 mg (has no administration in time range)  amLODipine (NORVASC) tablet 5 mg (has no administration in time range)  atorvastatin (LIPITOR) tablet 80 mg (80 mg Oral Given 11/27/20 2221)  isosorbide mononitrate (IMDUR) 24 hr tablet 90 mg (has no administration in time range)  metoprolol succinate (TOPROL-XL) 24 hr tablet 25 mg (has no administration in time range)  famotidine (PEPCID) tablet 20 mg (has no administration in time range)  lanthanum (FOSRENOL) chewable tablet 1,000 mg (has no administration in time range)  pantoprazole (PROTONIX) EC tablet 40 mg (40 mg Oral Given 11/28/20 0529)  clopidogrel (PLAVIX) tablet 75 mg (has no administration in time range)  sodium chloride flush (NS) 0.9 % injection 3 mL (3 mLs Intravenous Given 11/27/20 2223)  sodium chloride flush (NS) 0.9 % injection 3 mL (has no administration in time range)  0.9 %  sodium chloride infusion (has no administration in time range)  acetaminophen (TYLENOL) tablet 650 mg (has no administration in time range)  ondansetron (ZOFRAN) injection 4 mg (has no administration in time range)  heparin injection 5,000 Units (5,000 Units Subcutaneous Given 11/28/20  0530)  albuterol (VENTOLIN HFA) 108 (90 Base) MCG/ACT inhaler 4 puff (4 puffs Inhalation Given 11/27/20 1110)  nitroGLYCERIN (NITROSTAT) SL tablet 0.4 mg (0.4 mg Sublingual Given 11/27/20 1253)  furosemide (LASIX) injection 40 mg (40 mg Intravenous Given 11/27/20 1351)  morphine 4 MG/ML injection 4 mg (4 mg Intravenous Given 11/27/20 1347)  methylPREDNISolone sodium succinate (SOLU-MEDROL) 125 mg/2 mL injection 125 mg (125 mg Intravenous Given 11/27/20 1354)  magnesium sulfate IVPB 2 g 50 mL (0 g Intravenous Stopped 11/27/20 1458)    ED Course  I have reviewed the triage vital signs and the nursing notes.  Pertinent labs & imaging results that were available during my care of the patient were reviewed by me and considered in my medical decision making (see chart for details).    MDM Rules/Calculators/A&P                         65 year old male with diastolic heart failure, ESRD on dialysis presenting to ER with concern for shortness of breath.  Patient requiring 4 to 6 L nasal cannula.  CXR with significant vascular congestion.  Radiologist read as multifocal pneumonia however given no fever,  cough, low suspicion for pneumonia at this time.  Suspect most likely related to heart failure and ESRD.  EKG without new ischemic changes, troponin is elevated, suspect this is demand related.  Known history of coronary artery disease, cardiology recently evaluated and states patient not good candidate for PCI and recommended medical management at the time.  Consulted nephrology to request dialysis, consulted hospitalist for admission.  Final Clinical Impression(s) / ED Diagnoses Final diagnoses:  Acute respiratory failure with hypoxia (HCC)  Heart failure, unspecified HF chronicity, unspecified heart failure type ([place])  ESRD (end stage renal disease) on dialysis [facility])    Rx / DC Orders ED Discharge Orders    None       Lucrezia Starch, MD 11/28/20 [phone]

## 2020-11-27 NOTE — ED Notes (Signed)
Pt still in Dialysis. Gave report to floor nurse. Pt trasferred to "OTF"

## 2020-11-27 NOTE — Progress Notes (Signed)
Main lab called with critical value- pO2 < 31. MD notifed

## 2020-11-28 ENCOUNTER — Observation Stay (HOSPITAL_BASED_OUTPATIENT_CLINIC_OR_DEPARTMENT_OTHER): Payer: Medicare Other

## 2020-11-28 ENCOUNTER — Encounter (HOSPITAL_COMMUNITY): Payer: Self-pay | Admitting: Internal Medicine

## 2020-11-28 DIAGNOSIS — Z20822 Contact with and (suspected) exposure to covid-19: Secondary | ICD-10-CM | POA: Diagnosis not present

## 2020-11-28 DIAGNOSIS — E875 Hyperkalemia: Secondary | ICD-10-CM | POA: Diagnosis not present

## 2020-11-28 DIAGNOSIS — I480 Paroxysmal atrial fibrillation: Secondary | ICD-10-CM | POA: Diagnosis not present

## 2020-11-28 DIAGNOSIS — R778 Other specified abnormalities of plasma proteins: Secondary | ICD-10-CM | POA: Diagnosis not present

## 2020-11-28 DIAGNOSIS — I1311 Hypertensive heart and chronic kidney disease without heart failure, with stage 5 chronic kidney disease, or end stage renal disease: Secondary | ICD-10-CM | POA: Diagnosis not present

## 2020-11-28 DIAGNOSIS — I5031 Acute diastolic (congestive) heart failure: Secondary | ICD-10-CM

## 2020-11-28 DIAGNOSIS — N186 End stage renal disease: Secondary | ICD-10-CM | POA: Diagnosis not present

## 2020-11-28 DIAGNOSIS — R0602 Shortness of breath: Secondary | ICD-10-CM | POA: Diagnosis not present

## 2020-11-28 DIAGNOSIS — E8779 Other fluid overload: Secondary | ICD-10-CM | POA: Diagnosis not present

## 2020-11-28 DIAGNOSIS — J9601 Acute respiratory failure with hypoxia: Secondary | ICD-10-CM | POA: Diagnosis not present

## 2020-11-28 DIAGNOSIS — I509 Heart failure, unspecified: Secondary | ICD-10-CM | POA: Diagnosis not present

## 2020-11-28 DIAGNOSIS — I2510 Atherosclerotic heart disease of native coronary artery without angina pectoris: Secondary | ICD-10-CM | POA: Diagnosis not present

## 2020-11-28 DIAGNOSIS — J9621 Acute and chronic respiratory failure with hypoxia: Secondary | ICD-10-CM

## 2020-11-28 DIAGNOSIS — D631 Anemia in chronic kidney disease: Secondary | ICD-10-CM | POA: Diagnosis not present

## 2020-11-28 DIAGNOSIS — Z992 Dependence on renal dialysis: Secondary | ICD-10-CM | POA: Diagnosis not present

## 2020-11-28 DIAGNOSIS — R748 Abnormal levels of other serum enzymes: Secondary | ICD-10-CM | POA: Diagnosis not present

## 2020-11-28 LAB — RENAL FUNCTION PANEL
Albumin: 3.3 g/dL — ABNORMAL LOW (ref 3.5–5.0)
Anion gap: 19 — ABNORMAL HIGH (ref 5–15)
BUN: 47 mg/dL — ABNORMAL HIGH (ref 8–23)
CO2: 22 mmol/L (ref 22–32)
Calcium: 8.7 mg/dL — ABNORMAL LOW (ref 8.9–10.3)
Chloride: 97 mmol/L — ABNORMAL LOW (ref 98–111)
Creatinine, Ser: 10.34 mg/dL — ABNORMAL HIGH (ref 0.61–1.24)
GFR, Estimated: 5 mL/min — ABNORMAL LOW (ref >60)
Glucose, Bld: 144 mg/dL — ABNORMAL HIGH (ref 70–99)
Phosphorus: 6.5 mg/dL — ABNORMAL HIGH (ref 2.5–4.6)
Potassium: 4.9 mmol/L (ref 3.5–5.1)
Sodium: 138 mmol/L (ref 135–145)

## 2020-11-28 LAB — CBC
HCT: 38.1 % — ABNORMAL LOW (ref 39.0–52.0)
Hemoglobin: 13.1 g/dL (ref 13.0–17.0)
MCH: 31.4 pg (ref 26.0–34.0)
MCHC: 34.4 g/dL (ref 30.0–36.0)
MCV: 91.4 fL (ref 80.0–100.0)
Platelets: 267 10*3/uL (ref 150–400)
RBC: 4.17 MIL/uL — ABNORMAL LOW (ref 4.22–5.81)
RDW: 16.5 % — ABNORMAL HIGH (ref 11.5–15.5)
WBC: 6.5 10*3/uL (ref 4.0–10.5)
nRBC: 0 % (ref 0.0–0.2)

## 2020-11-28 LAB — ECHOCARDIOGRAM COMPLETE
Area-P 1/2: 3.53 cm2
Height: 72 in
MV M vel: 5.35 m/s
MV Peak grad: 114.5 mmHg
Radius: 0.4 cm
S' Lateral: 4.1 cm
Weight: 2976 oz

## 2020-11-28 LAB — BASIC METABOLIC PANEL
Anion gap: 20 — ABNORMAL HIGH (ref 5–15)
BUN: 49 mg/dL — ABNORMAL HIGH (ref 8–23)
CO2: 23 mmol/L (ref 22–32)
Calcium: 8.8 mg/dL — ABNORMAL LOW (ref 8.9–10.3)
Chloride: 96 mmol/L — ABNORMAL LOW (ref 98–111)
Creatinine, Ser: 10.51 mg/dL — ABNORMAL HIGH (ref 0.61–1.24)
GFR, Estimated: 5 mL/min — ABNORMAL LOW (ref >60)
Glucose, Bld: 129 mg/dL — ABNORMAL HIGH (ref 70–99)
Potassium: 4.2 mmol/L (ref 3.5–5.1)
Sodium: 139 mmol/L (ref 135–145)

## 2020-11-28 NOTE — Progress Notes (Signed)
  Echocardiogram 2D Echocardiogram has been performed.  Kenneth [name] 11/28/2020, 3:09 PM

## 2020-11-28 NOTE — Progress Notes (Signed)
Patient ID: Kenneth [name], male   DOB: 02-12-1956, 65 y.o.   MRN: [id number] S: Pt just discharged from [facility] on 11/22/20 after presenting with volume overload due to shortened treatments and large fluid gains.  Had truncated dialysis session of 3 liters on Saturday (per pt due to snow) and came in with SOB.  S/p urgent HD and able to get almost 4 liters but still remains above edw.  He c/o of SOB but is not on oxygen with good SpO2. O:BP (!) 159/90 (BP Location: Left Arm)   Pulse 82   Temp 99 F (37.2 C) (Oral)   Resp 20   Ht 6' (1.829 m)   Wt 84.4 kg   SpO2 95%   BMI 25.23 kg/m   Intake/Output Summary (Last 24 hours) at 11/28/2020 0941 Last data filed at 11/28/2020 0700 Gross per 24 hour  Intake 1198 ml  Output 3680 ml  Net -2482 ml   Intake/Output: I/O last 3 completed shifts: In: 1198 [P.O.:1198] Out: 3680 [Other:3680]  Intake/Output this shift:  No intake/output data recorded. Weight change:  Gen: NAD sitting up in bed CVS: RRR Resp: CTA Abd: benign Ext: no edema, LUE AVF +T/B  Recent Labs  Lab 11/27/20 0937 11/27/20 2248 11/28/20 0332  NA 140 138 139  K 6.3* 4.9 4.2  CL 101 97* 96*  CO2 20* 22 23  GLUCOSE 88 144* 129*  BUN 85* 47* 49*  CREATININE 14.82* 10.34* 10.51*  ALBUMIN  --  3.3*  --   CALCIUM 8.6* 8.7* 8.8*  PHOS  --  6.5*  --    Liver Function Tests: Recent Labs  Lab 11/27/20 2248  ALBUMIN 3.3*   No results for input(s): LIPASE, AMYLASE in the last 168 hours. No results for input(s): AMMONIA in the last 168 hours. CBC: Recent Labs  Lab 11/27/20 0937 11/27/20 2248  WBC 9.7 6.5  NEUTROABS 6.2  --   HGB 10.5* 13.1  HCT 32.4* 38.1*  MCV 95.6 91.4  PLT 270 267   Cardiac Enzymes: No results for input(s): CKTOTAL, CKMB, CKMBINDEX, TROPONINI in the last 168 hours. CBG: No results for input(s): GLUCAP in the last 168 hours.  Iron Studies: No results for input(s): IRON, TIBC, TRANSFERRIN, FERRITIN in the last 72 hours. Studies/Results: DG Chest  Port 1 View  Result Date: 11/27/2020 CLINICAL DATA:  Shortness of breath. EXAM: PORTABLE CHEST 1 VIEW COMPARISON:  November 19, 2020. FINDINGS: Stable cardiomediastinal silhouette. No pneumothorax or pleural effusion is noted. Stable bilateral patchy airspace opacities are noted consistent with multifocal pneumonia. Bony thorax is unremarkable. IMPRESSION: Stable bilateral multifocal pneumonia. Electronically Signed   By: Marijo Conception M.D.   On: 11/27/2020 09:56   . amLODipine  5 mg Oral Daily  . aspirin EC  81 mg Oral Daily  . atorvastatin  80 mg Oral QHS  . Chlorhexidine Gluconate Cloth  6 each Topical Q0600  . clopidogrel  75 mg Oral Daily  . heparin  5,000 Units Subcutaneous Q8H  . isosorbide mononitrate  90 mg Oral Daily  . lanthanum  1,000 mg Oral TID with meals  . metoprolol succinate  25 mg Oral Daily  . pantoprazole  40 mg Oral Q0600  . sodium chloride flush  3 mL Intravenous Q12H  . sodium zirconium cyclosilicate  10 g Oral Daily    BMET    Component Value Date/Time   NA 139 11/28/2020 0332   K 4.2 11/28/2020 0332   CL 96 (L) 11/28/2020 [id number]  CO2 23 11/28/2020 0332   GLUCOSE 129 (H) 11/28/2020 0332   BUN 49 (H) 11/28/2020 0332   CREATININE 10.51 (H) 11/28/2020 0332   CALCIUM 8.8 (L) 11/28/2020 0332   GFRNONAA 5 (L) 11/28/2020 0332   GFRAA 5 (L) 05/16/2020 0615   CBC    Component Value Date/Time   WBC 6.5 11/27/2020 2248   RBC 4.17 (L) 11/27/2020 2248   HGB 13.1 11/27/2020 2248   HCT 38.1 (L) 11/27/2020 2248   HCT 29.7 (L) 03/17/2018 1311   PLT 267 11/27/2020 2248   MCV 91.4 11/27/2020 2248   MCH 31.4 11/27/2020 2248   MCHC 34.4 11/27/2020 2248   RDW 16.5 (H) 11/27/2020 2248   LYMPHSABS 1.7 11/27/2020 0937   MONOABS 0.9 11/27/2020 0937   EOSABS 0.7 (H) 11/27/2020 0937   BASOSABS 0.1 11/27/2020 0937    Dialysis Orders: Center:[facility]. TueThuSat, 4 hrs 0 min, 180NRe Optiflux, BFR 400, DFR Manual 800 mL/min, EDW 82.5 (kg), Dialysate 2.0 K, 2.0 Ca Heparin  2400 unit bolus sensipar 30 mg q HD mircera 75 mcg q 2 weeks- last dose 1/13 Calcitriol 2.5 mcg PO q HD  Assessment/Plan: 1. Volume overload: CXR consistent with volume overload. Pt has not been meeting his EDW or following fluid restrictions.Had 3.6 L net UF HD last night but still 2 kg above edw.  We discussed the importance of fluid restrictions especially in light ofcardiac disease. I spoke with [name] and Dr. Ree Kida regarding his disposition and treatment plan.  Due to high census in the hospital and the need to truncate treatment times, he would be better served to go to his regular outpatient schedule tomorrow for his full treatment. 2. ESRD:Continue TTS schedule. Dialysis treatments will be shortened here due to high inpatient census. Patient will get more consistent/longer dialysis outpatient after discharge. 3. Hypertension/volume:BP remains elevated but SOB is resolved and no edema on exam. Continue home meds, further UF with HD as tolerated. 4. Anemia:Hgb at goal. Not due for ESA yet and may need decreased dose. 5. Metabolic bone disease:Calcium controlled, phosphorus elevated.Resume VDRA, sensipar and binders 6. Nutrition:Discussed renal diet and fluid restrictions. Albumin low, will start a protein supplement.  7. Severe multivessel [id number] hospitalizations for the same. Cardiology recommended medical management, not a candidate for CABG or PCI. No chest pain at present. 8. Disposition- stable for discharge to home and go to his regular outpatient dialysis tomorrow.  Donetta Potts, MD [employer] [phone]

## 2020-11-28 NOTE — TOC Initial Note (Signed)
Transition of Care [facility]) - Initial/Assessment Note    Patient Details  Name: Kenneth [name] MRN: [id number] Date of Birth: 1956/01/05  Transition of Care [facility] CM/SW Contact:    Zenon Mayo, RN Phone Number: 11/28/2020, 1:22 PM  Clinical Narrative:                 Patient is for dc today, he is set up with [facility] for [facility].  Soc will begin 24 to 48 hrs post dc.    Expected Discharge Plan: [place] Barriers to Discharge: No Barriers Identified   Patient Goals and CMS Choice   CMS Medicare.gov Compare Post Acute Care list provided to:: Patient Choice offered to / list presented to : Patient  Expected Discharge Plan and Services Expected Discharge Plan: [place]   Discharge Planning Services: CM Consult   Living arrangements for the past 2 months: Single Family Home Expected Discharge Date: 11/28/20                 DME Agency: NA       HH Arranged: RN,Disease Management HH Agency: [place] Date [facility] Agency Contacted: 11/28/20 Time HH Agency Contacted: 16 Representative spoke with at [place]: Tommi Rumps  Prior Living Arrangements/Services Living arrangements for the past 2 months: [place] Lives with:: Adult Children Patient language and need for interpreter reviewed:: Yes Do you feel safe going back to the place where you live?: Yes      Need for Family Participation in Patient Care: Yes (Comment) Care giver support system in place?: Yes (comment)   Criminal Activity/Legal Involvement Pertinent to Current Situation/Hospitalization: No - Comment as needed  Activities of Daily Living Home Assistive Devices/Equipment: None ADL Screening (condition at time of admission) Patient's cognitive ability adequate to safely complete daily activities?: Yes Is the patient deaf or have difficulty hearing?: Yes Does the patient have difficulty seeing, even when wearing glasses/contacts?: No Does the patient have  difficulty concentrating, remembering, or making decisions?: No Patient able to express need for assistance with ADLs?: Yes Does the patient have difficulty dressing or bathing?: No Independently performs ADLs?: Yes (appropriate for developmental age) Does the patient have difficulty walking or climbing stairs?: No Weakness of Legs: None Weakness of Arms/Hands: None  Permission Sought/Granted                  Emotional Assessment       Orientation: : Oriented to Self,Oriented to Place,Oriented to  Time,Oriented to Situation Alcohol / Substance Use: Not Applicable Psych Involvement: No (comment)  Admission diagnosis:  CHF (congestive heart failure) ([place]) [I50.9] ESRD (end stage renal disease) on dialysis ([place]) [N18.6, Z99.2] Acute respiratory failure with hypoxia ([place]) [J96.01] Heart failure, unspecified HF chronicity, unspecified heart failure type ([place]) [I50.9] CHF exacerbation ([place]) [I50.9] Patient Active Problem List   Diagnosis Date Noted  . CHF exacerbation ([place]) 11/28/2020  . Heart failure ([place]) 11/27/2020  . Fluid overload 11/22/2020  . Volume overload 11/19/2020  . Chest pain 10/27/2020  . PAF (paroxysmal atrial fibrillation) ([place]) 10/27/2020  . Troponin level elevated   . Angina at rest [facility]) 09/24/2020  . Accelerated hypertension 09/24/2020  . Dyslipidemia 09/24/2020  . NSTEMI (non-ST elevated myocardial infarction) ([place]) 05/12/2020  . Non-ST elevated myocardial infarction ([place]) 05/12/2020  . DNR (do not resuscitate) discussion   . Palliative care by specialist   . Goals of care, counseling/discussion   . CAD (coronary artery disease)   .  STEMI (ST elevation myocardial infarction) ([place]) 09/17/2018  . Unstable angina ([place])   . ACS (acute coronary syndrome) ([place]) 09/01/2018  . ESRD (end stage renal disease) ([place]) 07/20/2018  . Acute hypoxemic respiratory failure ([place]) 05/11/2018  . Cardiac arrest ([place]) 03/09/2018  . Acute encephalopathy   . Acute on  chronic respiratory failure ([place]) 02/16/2018  . Tobacco dependence 02/16/2018  . Pulmonary edema 01/26/2018  . Acute respiratory failure with hypoxia ([place]) 01/26/2018  . Atrial fibrillation and flutter ([place])   . Shortness of breath   . Acute on chronic diastolic CHF (congestive heart failure) ([place])   . Hypothermia 10/08/2016  . ESRD on hemodialysis ([place]) 10/08/2016  . Fall 10/08/2016  . Syncope 10/07/2016  . Near syncope 07/07/2016  . Cardiomyopathy ([place]) 07/07/2016  . Ventricular tachycardia, non-sustained ([place]) 07/06/2016  . Hypoglycemia 07/06/2016  . CKD (chronic kidney disease) stage 5, GFR less than 15 ml/min (HCC)   . Alcoholism ([place]) 05/13/2016  . Hypertensive emergency 12/08/2013  . Cardiomyopathy secondary 10/09/2011  . Essential hypertension 10/09/2011  . Alcohol withdrawal delirium ([place]) 09/25/2011  . Chronic diastolic CHF (congestive heart failure) ([place]) 09/24/2011   PCP:  [name], MD Pharmacy:   [facility], [place] [address] Phone: [phone] Fax: [phone]     Social Determinants of Health (SDOH) Interventions    Readmission Risk Interventions Readmission Risk Prevention Plan 11/28/2020 10/29/2020 10/04/2020  Transportation Screening Complete Complete Complete  Medication Review Press photographer) Complete Complete Complete  PCP or Specialist appointment within 3-5 days of discharge - - Complete  HRI or [place] Complete Complete Complete  SW Recovery Care/Counseling Consult Complete Complete Complete  Palliative Care Screening Not Applicable Not Applicable Complete  [place] Not Applicable Not Applicable Not Applicable  Some recent data might be hidden

## 2020-11-28 NOTE — Discharge Summary (Signed)
Physician Discharge Summary  Kenneth [name] DOB: [birth year] DOA: 11/27/2020  PCP: [name], MD  Admit date: 11/27/2020 Discharge date: 11/28/2020  Time spent: 45 minutes  Recommendations for Outpatient Follow-up:  Patient will be discharged to home.  Patient will need to follow up with primary care provider within one week of discharge.  Continue scheduled dialysis. Patient should continue medications as prescribed.  Patient should follow a renal diet with 1260m fluid restriction per day.   Discharge Diagnoses:  Acute hypoxic respiratory failure/acute on chronic diastolic heart failure secondary to volume overload Hyperkalemia Elevated troponin Paroxysmal atrial fibrillation End-stage renal disease  Discharge Condition: Stable  Diet recommendation: renal dit  Filed Weights   11/27/20 0928 11/27/20 2134 11/28/20 0218  Weight: 90.7 kg 84.6 kg 84.4 kg    History of present illness:  On 11/27/2020 by Dr. PWynetta FinesRTONATIUH MENDICINOis a 65y.o. male with medical history significant of ESRD on HD TTS, multivessel CAD diagnosed recently but considered to be a poor candidate for PCI/CABG and recommend medical management only (July 2021), PEA rest in 2019, chronic combined systolic and diastolic CHF, paroxysmal A. fib, HTN, presented with increasing shortness of breath for 2 days.  Patient claimed he never missed his HD session since the last session was on Saturday which was completed.  Since yesterday patient started to feel increasing shortness of breath, overnighted become constant and with significant orthopnea could not lie down.  Denied any cough, no chest pain no fever chills.  Hospital Course:  Acute hypoxic respiratory failure/acute on chronic diastolic heart failure secondary to volume overload -Patient presented with signs of fluid overload with JVD and crackles -Oxygen saturations are 65% on room air upon arrival, improved with 4 L of oxygen.  Has been weaned  off of oxygen and maintaining oxygen saturations in the 90s -He was given 1 dose of Lasix -Nephrology consulted for dialysis-discussed with Dr. CMarval Regal feels patient is however on oxygen and is breathing better, can return to outpatient dialysis -Currently no longer complaining of chest pain or shortness of breath -Echocardiogram showed EF [id number] grade 2 diastolic dysfunction  Hyperkalemia -Resolved with dialysis -Was also given 1 dose of IV Lasix as well as Lokelma  Elevated troponin -Likely demand ischemia from CHF and time overload -Patient was noted to have multivessel disease in July 2021 but considered to be a poor candidate for PCI or CABG as per cardiology.  Medical management was recommended -Echocardiogram noted to have inferior lateral hypokinesis, EF of [id number] grade 2 diastolic dysfunction. -Patient to follow-up with his primary cardiologist. -Discussed with Dr. SGardiner Rhyme no further workup at this time.  Echo findings are indicative of his multivessel disease.  Paroxysmal atrial fibrillation -Per cardiology records, not an anticoagulation candidate due to noncompliance  End-stage renal disease -Patient dialyzes Tuesday, Thursday, Saturday states he has not missed any of his dialysis sessions -Continue hemodialysis as scheduled  Procedures: Echocardiogram  Consultations: Nephrology  Discharge Exam: Vitals:   11/28/20 0734 11/28/20 1201  BP: (!) 159/90 (!) 157/72  Pulse: 82 76  Resp: 20 20  Temp: 99 F (37.2 C) 98 F (36.7 C)  SpO2: 95% 98%     General: Well developed, chronically ill-appearing, NAD  HEENT: NCAT, mucous membranes moist.  Cardiovascular: S1 S2 auscultated, RRR.  Respiratory: Diminished however clear, no wheezing or rhonchi, crackles  Abdomen: Soft, nontender, nondistended, + bowel sounds  Extremities: warm dry without cyanosis clubbing or edema  Neuro: AAOx3, nonfocal  Psych:  appropriate mood and affect  Discharge  Instructions Discharge Instructions    Discharge instructions   Complete by: As directed    Patient will be discharged to home.  Patient will need to follow up with primary care provider within one week of discharge.  Continue scheduled dialysis. Patient should continue medications as prescribed.  Patient should follow a renal diet with 1229m fluid restriction per day.   Increase activity slowly   Complete by: As directed    No wound care   Complete by: As directed      Allergies as of 11/28/2020   No Known Allergies     Medication List    TAKE these medications   amLODipine 10 MG tablet Commonly known as: NORVASC Take 0.5 tablets (5 mg total) by mouth daily.   aspirin 81 MG EC tablet Take 1 tablet (81 mg total) by mouth daily. Swallow whole.   atorvastatin 80 MG tablet Commonly known as: LIPITOR Take 1 tablet (80 mg total) by mouth daily. What changed: when to take this   clopidogrel 75 MG tablet Commonly known as: PLAVIX Take 1 tablet (75 mg total) by mouth daily. What changed: when to take this   famotidine 20 MG tablet Commonly known as: PEPCID Take 1 tablet (20 mg total) by mouth 2 (two) times daily as needed for heartburn.   isosorbide mononitrate 30 MG 24 hr tablet Commonly known as: IMDUR Take 3 tablets (90 mg total) by mouth daily.   lanthanum 1000 MG chewable tablet Commonly known as: FOSRENOL Chew 1,000 mg by mouth 3 (three) times daily.   metoprolol succinate 25 MG 24 hr tablet Commonly known as: TOPROL-XL Take 25 mg by mouth in the morning and at bedtime.   nitroGLYCERIN 0.4 MG SL tablet Commonly known as: NITROSTAT Place 1 tablet (0.4 mg total) under the tongue every 5 (five) minutes as needed for chest pain (max 3 doses).   pantoprazole 40 MG tablet Commonly known as: PROTONIX Take 1 tablet (40 mg total) by mouth daily at 6 (six) AM.   Rena-Vite Rx 1 MG Tabs Take 1 tablet by mouth daily.   Tylenol PM Extra Strength 25-500 MG Tabs  tablet Generic drug: diphenhydramine-acetaminophen Take 2 tablets by mouth at bedtime as needed (pain).   VITAMIN D PO Take 1 tablet by mouth daily.      No Known Allergies  Follow-up Information    [facility]   Specialty: Home Health Services Why: HV[place]urn[place]formation: [place] [id number]               The results of significant diagnostics from this hospitalization (including imaging, microbiology, ancillary and laboratory) are listed below for reference.    Significant Diagnostic Studies: DG Chest Port 1 View  Result Date: 11/27/2020 CLINICAL DATA:  Shortness of breath. EXAM: PORTABLE CHEST 1 VIEW COMPARISON:  November 19, 2020. FINDINGS: Stable cardiomediastinal silhouette. No pneumothorax or pleural effusion is noted. Stable bilateral patchy airspace opacities are noted consistent with multifocal pneumonia. Bony thorax is unremarkable. IMPRESSION: Stable bilateral multifocal pneumonia. Electronically Signed   By: JMarijo ConceptionM.D.   On: 11/27/2020 09:56   DG Chest Port 1 View  Result Date: 11/19/2020 CLINICAL DATA:  Short of breath, previous tobacco abuse, COPD EXAM: PORTABLE CHEST 1 VIEW COMPARISON:  10/26/2020 FINDINGS: Single frontal view of the chest demonstrates mild enlargement of the cardiac silhouette. There is diffuse increased interstitial prominence with  bilateral ground-glass airspace disease. Trace right effusion is noted. No pneumothorax. IMPRESSION: 1. Findings most consistent with congestive heart failure. Electronically Signed   By: Randa Ngo M.D.   On: 11/19/2020 19:52   ECHOCARDIOGRAM COMPLETE  Result Date: 11/28/2020    ECHOCARDIOGRAM REPORT   Patient Name:   DANNER BUZZA Date of Exam: 11/28/2020 Medical Rec #:  [id number]      Height:       72.0 in Accession #:    [id number]     Weight:       186.0 lb Date of Birth:  1956/10/24      BSA:          2.066 m Patient Age:    56  years       BP:           159/90 mmHg Patient Gender: M              HR:           63 bpm. Exam Location:  Inpatient Procedure: 2D Echo, Cardiac Doppler and Color Doppler Indications:    CHF-Acute Diastolic [id number]  History:        Patient has prior history of Echocardiogram examinations, most                 recent 05/13/2020. Cardiomyopathy and CHF; Previous Myocardial                 Infarction and CAD.  Sonographer:    Bernadene Person RDCS Referring Phys: [id number] [place]  1. Left ventricular ejection fraction, by estimation, is 45 to 50%. The left ventricle has mildly decreased function. The left ventricle demonstrates regional wall motion abnormalities. Inferolateral hypokinesis. The left ventricular internal cavity size was mildly dilated. Left ventricular diastolic parameters are consistent with Grade II diastolic dysfunction (pseudonormalization). Elevated left atrial pressure.  2. Right ventricular systolic function is normal. The right ventricular size is normal. There is normal pulmonary artery systolic pressure. The estimated right ventricular systolic pressure is 123XX123 mmHg.  3. Left atrial size was mildly dilated.  4. The mitral valve is abnormal. Mild mitral valve regurgitation. No evidence of mitral stenosis. Moderate mitral annular calcification.  5. The aortic valve is tricuspid. Aortic valve regurgitation is not visualized. Mild to moderate aortic valve sclerosis/calcification is present, without any evidence of aortic stenosis. FINDINGS  Left Ventricle: Left ventricular ejection fraction, by estimation, is 45 to 50%. The left ventricle has mildly decreased function. The left ventricle demonstrates regional wall motion abnormalities. The left ventricular internal cavity size was mildly dilated. There is no left ventricular hypertrophy. Left ventricular diastolic parameters are consistent with Grade II diastolic dysfunction (pseudonormalization). Elevated left atrial pressure. Right  Ventricle: The right ventricular size is normal. No increase in right ventricular wall thickness. Right ventricular systolic function is normal. There is normal pulmonary artery systolic pressure. The tricuspid regurgitant velocity is 2.10 m/s, and  with an assumed right atrial pressure of 3 mmHg, the estimated right ventricular systolic pressure is 123XX123 mmHg. Left Atrium: Left atrial size was mildly dilated. Right Atrium: Right atrial size was normal in size. Pericardium: There is no evidence of pericardial effusion. Mitral Valve: The mitral valve is abnormal. Moderate mitral annular calcification. Mild mitral valve regurgitation. No evidence of mitral valve stenosis. Tricuspid Valve: The tricuspid valve is normal in structure. Tricuspid valve regurgitation is not demonstrated. Aortic Valve: The aortic valve is tricuspid. Aortic valve regurgitation is not visualized. Mild to  moderate aortic valve sclerosis/calcification is present, without any evidence of aortic stenosis. Pulmonic Valve: The pulmonic valve was grossly normal. Pulmonic valve regurgitation is not visualized. Aorta: The aortic root and ascending aorta are structurally normal, with no evidence of dilitation. IAS/Shunts: The interatrial septum was not well visualized.  LEFT VENTRICLE PLAX 2D LVIDd:         6.00 cm  Diastology LVIDs:         4.10 cm  LV e' medial:    4.40 cm/s LV PW:         1.00 cm  LV E/e' medial:  24.5 LV IVS:        1.00 cm  LV e' lateral:   6.39 cm/s LVOT diam:     1.90 cm  LV E/e' lateral: 16.9 LV SV:         61 LV SV Index:   29 LVOT Area:     2.84 cm  RIGHT VENTRICLE RV S prime:     17.30 cm/s TAPSE (M-mode): 2.0 cm LEFT ATRIUM             Index       RIGHT ATRIUM           Index LA diam:        3.80 cm 1.84 cm/m  RA Area:     14.20 cm LA Vol (A2C):   89.2 ml 43.18 ml/m RA Volume:   35.80 ml  17.33 ml/m LA Vol (A4C):   55.4 ml 26.82 ml/m LA Biplane Vol: 71.0 ml 34.37 ml/m  AORTIC VALVE LVOT Vmax:   105.73 cm/s LVOT Vmean:   75.333 cm/s LVOT VTI:    0.215 m  AORTA Ao Root diam: 3.10 cm Ao Asc diam:  3.30 cm MITRAL VALVE                 TRICUSPID VALVE MV Area (PHT): 3.53 cm      TR Peak grad:   17.6 mmHg MV Decel Time: 215 msec      TR Vmax:        210.00 cm/s MR Peak grad:    114.5 mmHg MR Mean grad:    75.0 mmHg   SHUNTS MR Vmax:         535.00 cm/s Systemic VTI:  0.21 m MR Vmean:        404.0 cm/s  Systemic Diam: 1.90 cm MR PISA:         1.01 cm MR PISA Eff ROA: 6 mm MR PISA Radius:  0.40 cm MV E velocity: 108.00 cm/s MV A velocity: 77.60 cm/s MV E/A ratio:  1.39 Oswaldo Milian MD Electronically signed by Oswaldo Milian MD Signature Date/Time: 11/28/2020/3:43:20 PM    Final    VAS [place] LOWER EXTREMITY VENOUS (DVT)  Result Date: 11/20/2020  Lower Venous DVT Study Indications: Elevated Ddimer.  Risk Factors: None identified. Comparison Study: No prior studies. Performing Technologist: Oliver Hum RVT  Examination Guidelines: A complete evaluation includes B-mode imaging, spectral Doppler, color Doppler, and power Doppler as needed of all accessible portions of each vessel. Bilateral testing is considered an integral part of a complete examination. Limited examinations for reoccurring indications may be performed as noted. The reflux portion of the exam is performed with the patient in reverse Trendelenburg.  +---------+---------------+---------+-----------+----------+--------------+ RIGHT    CompressibilityPhasicitySpontaneityPropertiesThrombus Aging +---------+---------------+---------+-----------+----------+--------------+ CFV      Full           Yes      Yes                                 +---------+---------------+---------+-----------+----------+--------------+  SFJ      Full                                                        +---------+---------------+---------+-----------+----------+--------------+ FV Prox  Full                                                         +---------+---------------+---------+-----------+----------+--------------+ FV Mid   Full                                                        +---------+---------------+---------+-----------+----------+--------------+ FV DistalFull                                                        +---------+---------------+---------+-----------+----------+--------------+ PFV      Full                                                        +---------+---------------+---------+-----------+----------+--------------+ POP      Full           Yes      Yes                                 +---------+---------------+---------+-----------+----------+--------------+ PTV      Full                                                        +---------+---------------+---------+-----------+----------+--------------+ PERO     Full                                                        +---------+---------------+---------+-----------+----------+--------------+   +---------+---------------+---------+-----------+----------+--------------+ LEFT     CompressibilityPhasicitySpontaneityPropertiesThrombus Aging +---------+---------------+---------+-----------+----------+--------------+ CFV      Full           Yes      Yes                                 +---------+---------------+---------+-----------+----------+--------------+ SFJ      Full                                                        +---------+---------------+---------+-----------+----------+--------------+  FV Prox  Full                                                        +---------+---------------+---------+-----------+----------+--------------+ FV Mid   Full                                                        +---------+---------------+---------+-----------+----------+--------------+ FV DistalFull                                                         +---------+---------------+---------+-----------+----------+--------------+ PFV      Full                                                        +---------+---------------+---------+-----------+----------+--------------+ POP      Full           Yes      Yes                                 +---------+---------------+---------+-----------+----------+--------------+ PTV      Full                                                        +---------+---------------+---------+-----------+----------+--------------+ PERO     Full                                                        +---------+---------------+---------+-----------+----------+--------------+     Summary: RIGHT: - There is no evidence of deep vein thrombosis in the lower extremity.  - No cystic structure found in the popliteal fossa.  LEFT: - There is no evidence of deep vein thrombosis in the lower extremity.  - No cystic structure found in the popliteal fossa.  *See table(s) above for measurements and observations. Electronically signed by Deitra Mayo MD on 11/20/2020 at 4:10:40 PM.    Final     Microbiology: Recent Results (from the past 240 hour(s))  SARS Coronavirus 2 by RT PCR (hospital order, performed in [facility] hospital lab) Nasopharyngeal Nasopharyngeal Swab     Status: None   Collection Time: 11/19/20  7:36 PM   Specimen: Nasopharyngeal Swab  Result Value Ref Range Status   SARS Coronavirus 2 NEGATIVE NEGATIVE Final    Comment: (NOTE) SARS-CoV-2 target nucleic acids are NOT DETECTED.  The SARS-CoV-2 RNA is generally detectable in upper and lower respiratory specimens during the acute phase of infection. The lowest concentration of  SARS-CoV-2 viral copies this assay can detect is 250 copies / mL. A negative result does not preclude SARS-CoV-2 infection and should not be used as the sole basis for treatment or other patient management decisions.  A negative result may occur with improper  specimen collection / handling, submission of specimen other than nasopharyngeal swab, presence of viral mutation(s) within the areas targeted by this assay, and inadequate number of viral copies (<250 copies / mL). A negative result must be combined with clinical observations, patient history, and epidemiological information.  Fact Sheet for Patients:   StrictlyIdeas.no  Fact Sheet for Healthcare Providers: BankingDealers.co.za  This test is not yet approved or  cleared by the [place] FDA and has been authorized for detection and/or diagnosis of SARS-CoV-2 by FDA under an Emergency Use Authorization (EUA).  This EUA will remain in effect (meaning this test can be used) for the duration of the COVID-19 declaration under Section 564(b)(1) of the Act, 21 U.S.C. section 360bbb-3(b)(1), unless the authorization is terminated or revoked sooner.  Performed at [facility], [place] [address], [place], [place] [address]   Blood Culture (routine x 2)     Status: None   Collection Time: 11/19/20  8:16 PM   Specimen: BLOOD  Result Value Ref Range Status   Specimen Description BLOOD RIGHT ANTECUBITAL  Final   Special Requests   Final    BOTTLES DRAWN AEROBIC AND ANAEROBIC Blood Culture results may not be optimal due to an inadequate volume of blood received in culture bottles   Culture   Final    NO GROWTH 5 DAYS Performed at [facility], [place] [address] [place], [place] [address]    Report Status 11/24/2020 FINAL  Final  SARS Coronavirus 2 by RT PCR (hospital order, performed in [facility] hospital lab) Nasopharyngeal Nasopharyngeal Swab     Status: None   Collection Time: 11/27/20  9:37 AM   Specimen: Nasopharyngeal Swab  Result Value Ref Range Status   SARS Coronavirus 2 NEGATIVE NEGATIVE Final    Comment: (NOTE) SARS-CoV-2 target nucleic acids are NOT DETECTED.  The SARS-CoV-2 RNA is generally detectable in upper and  lower respiratory specimens during the acute phase of infection. The lowest concentration of SARS-CoV-2 viral copies this assay can detect is 250 copies / mL. A negative result does not preclude SARS-CoV-2 infection and should not be used as the sole basis for treatment or other patient management decisions.  A negative result may occur with improper specimen collection / handling, submission of specimen other than nasopharyngeal swab, presence of viral mutation(s) within the areas targeted by this assay, and inadequate number of viral copies (<250 copies / mL). A negative result must be combined with clinical observations, patient history, and epidemiological information.  Fact Sheet for Patients:   StrictlyIdeas.no  Fact Sheet for Healthcare Providers: BankingDealers.co.za  This test is not yet approved or  cleared by the [place] FDA and has been authorized for detection and/or diagnosis of SARS-CoV-2 by FDA under an Emergency Use Authorization (EUA).  This EUA will remain in effect (meaning this test can be used) for the duration of the COVID-19 declaration under Section 564(b)(1) of the Act, 21 U.S.C. section 360bbb-3(b)(1), unless the authorization is terminated or revoked sooner.  Performed at [facility], [place] [address], [place], [place] [address]      Labs: Basic Metabolic Panel: Recent Labs  Lab 11/27/20 [id number] 11/27/20 2248 11/28/20 0332  NA 140 138 139  K 6.3* 4.9 4.2  CL 101 97* 96*  CO2 20* 22 23  GLUCOSE 88 144* 129*  BUN 85* 47* 49*  CREATININE 14.82* 10.34* 10.51*  CALCIUM 8.6* 8.7* 8.8*  PHOS  --  6.5*  --    Liver Function Tests: Recent Labs  Lab 11/27/20 2248  ALBUMIN 3.3*   No results for input(s): LIPASE, AMYLASE in the last 168 hours. No results for input(s): AMMONIA in the last 168 hours. CBC: Recent Labs  Lab 11/27/20 0937 11/27/20 2248  WBC 9.7 6.5  NEUTROABS 6.2  --   HGB  10.5* 13.1  HCT 32.4* 38.1*  MCV 95.6 91.4  PLT 270 267   Cardiac Enzymes: No results for input(s): CKTOTAL, CKMB, CKMBINDEX, TROPONINI in the last 168 hours. BNP: BNP (last 3 results) Recent Labs    11/27/20 0937  BNP 1,733.7*    ProBNP (last 3 results) No results for input(s): PROBNP in the last 8760 hours.  CBG: No results for input(s): GLUCAP in the last 168 hours.     Signed:  Cristal Ford  [facility] 11/28/2020, 4:03 PM

## 2020-11-28 NOTE — TOC Transition Note (Signed)
Transition of Care [facility]) - CM/SW Discharge Note   Patient Details  Name: Kenneth [name] MRN: [id number] Date of Birth: July 19, 1956  Transition of Care [facility]) CM/SW Contact:  Zenon Mayo, RN Phone Number: 11/28/2020, 1:23 PM   Clinical Narrative:    Patient is for dc today, he is set up with [facility].  Soc will begin 24 to 48 hrs post dc.      Final next level of care: Home w Home Health Services Barriers to Discharge: No Barriers Identified   Patient Goals and CMS Choice   CMS Medicare.gov Compare Post Acute Care list provided to:: Patient Choice offered to / list presented to : Patient  Discharge Placement                       Discharge Plan and Services   Discharge Planning Services: CM Consult              DME Agency: NA       HH Arranged: RN,Disease Management HH Agency: [place] Date [facility] Agency Contacted: 11/28/20 Time [place]: [phone] Representative spoke with at [place]: [place] ([place]) Interventions     Readmission Risk Interventions Readmission Risk Prevention Plan 11/28/2020 10/29/2020 10/04/2020  Transportation Screening Complete Complete Complete  Medication Review Press photographer) Complete Complete Complete  PCP or Specialist appointment within 3-5 days of discharge - - Complete  HRI or [place] Complete Complete Complete  SW Recovery Care/Counseling Consult Complete Complete Complete  Palliative Care Screening Not Applicable Not Applicable Complete  [place] Not Applicable Not Applicable Not Applicable  Some recent data might be hidden

## 2020-11-28 NOTE — Care Management CC44 (Signed)
Condition Code 44 Documentation Completed  Patient Details  Name: AUTRY ZAREK MRN: [id number] Date of Birth: 21-Feb-1956   Condition Code 44 given:  Yes Patient signature on Condition Code 44 notice:  Yes Documentation of 2 MD's agreement:  Yes Code 44 added to claim:  Yes    Bethann Berkshire, [place] 11/28/2020, 10:05 AM

## 2020-11-30 ENCOUNTER — Inpatient Hospital Stay: Payer: Medicare Other | Admitting: Family Medicine

## 2020-11-30 ENCOUNTER — Other Ambulatory Visit: Payer: Self-pay | Admitting: *Deleted

## 2020-11-30 DIAGNOSIS — D631 Anemia in chronic kidney disease: Secondary | ICD-10-CM | POA: Diagnosis not present

## 2020-11-30 DIAGNOSIS — Z992 Dependence on renal dialysis: Secondary | ICD-10-CM | POA: Diagnosis not present

## 2020-11-30 DIAGNOSIS — N2581 Secondary hyperparathyroidism of renal origin: Secondary | ICD-10-CM | POA: Diagnosis not present

## 2020-11-30 DIAGNOSIS — N186 End stage renal disease: Secondary | ICD-10-CM | POA: Diagnosis not present

## 2020-11-30 DIAGNOSIS — E8779 Other fluid overload: Secondary | ICD-10-CM | POA: Diagnosis not present

## 2020-11-30 NOTE — Patient Outreach (Signed)
[place] [facility]) Care Management  11/30/2020  KNOWLEDGE BAQUERO 1956/01/31 [id number]   Outreach attempt #4, unsuccessful.  No response from member after multiple unsuccessful outreach attempts and letter sent.  Will close case at this time due to inability to maintain contact.  Will notify member and primary MD of case closure.  Valente David, [place], MSN [place] [phone]

## 2020-12-04 DIAGNOSIS — N186 End stage renal disease: Secondary | ICD-10-CM | POA: Diagnosis not present

## 2020-12-04 DIAGNOSIS — N2581 Secondary hyperparathyroidism of renal origin: Secondary | ICD-10-CM | POA: Diagnosis not present

## 2020-12-04 DIAGNOSIS — D631 Anemia in chronic kidney disease: Secondary | ICD-10-CM | POA: Diagnosis not present

## 2020-12-04 DIAGNOSIS — Z992 Dependence on renal dialysis: Secondary | ICD-10-CM | POA: Diagnosis not present

## 2020-12-04 DIAGNOSIS — E8779 Other fluid overload: Secondary | ICD-10-CM | POA: Diagnosis not present

## 2020-12-05 ENCOUNTER — Inpatient Hospital Stay: Payer: Medicare Other | Admitting: Family Medicine

## 2020-12-06 DIAGNOSIS — Z992 Dependence on renal dialysis: Secondary | ICD-10-CM | POA: Diagnosis not present

## 2020-12-06 DIAGNOSIS — N186 End stage renal disease: Secondary | ICD-10-CM | POA: Diagnosis not present

## 2020-12-06 DIAGNOSIS — N2581 Secondary hyperparathyroidism of renal origin: Secondary | ICD-10-CM | POA: Diagnosis not present

## 2020-12-06 DIAGNOSIS — E8779 Other fluid overload: Secondary | ICD-10-CM | POA: Diagnosis not present

## 2020-12-06 DIAGNOSIS — D631 Anemia in chronic kidney disease: Secondary | ICD-10-CM | POA: Diagnosis not present

## 2020-12-07 DIAGNOSIS — Z87891 Personal history of nicotine dependence: Secondary | ICD-10-CM | POA: Diagnosis not present

## 2020-12-07 DIAGNOSIS — I480 Paroxysmal atrial fibrillation: Secondary | ICD-10-CM | POA: Diagnosis not present

## 2020-12-07 DIAGNOSIS — Z7982 Long term (current) use of aspirin: Secondary | ICD-10-CM | POA: Diagnosis not present

## 2020-12-07 DIAGNOSIS — Z992 Dependence on renal dialysis: Secondary | ICD-10-CM | POA: Diagnosis not present

## 2020-12-07 DIAGNOSIS — N186 End stage renal disease: Secondary | ICD-10-CM | POA: Diagnosis not present

## 2020-12-07 DIAGNOSIS — Z8701 Personal history of pneumonia (recurrent): Secondary | ICD-10-CM | POA: Diagnosis not present

## 2020-12-07 DIAGNOSIS — I083 Combined rheumatic disorders of mitral, aortic and tricuspid valves: Secondary | ICD-10-CM | POA: Diagnosis not present

## 2020-12-07 DIAGNOSIS — I132 Hypertensive heart and chronic kidney disease with heart failure and with stage 5 chronic kidney disease, or end stage renal disease: Secondary | ICD-10-CM | POA: Diagnosis not present

## 2020-12-07 DIAGNOSIS — I5043 Acute on chronic combined systolic (congestive) and diastolic (congestive) heart failure: Secondary | ICD-10-CM | POA: Diagnosis not present

## 2020-12-07 DIAGNOSIS — I252 Old myocardial infarction: Secondary | ICD-10-CM | POA: Diagnosis not present

## 2020-12-07 DIAGNOSIS — I429 Cardiomyopathy, unspecified: Secondary | ICD-10-CM | POA: Diagnosis not present

## 2020-12-07 DIAGNOSIS — J9601 Acute respiratory failure with hypoxia: Secondary | ICD-10-CM | POA: Diagnosis not present

## 2020-12-07 DIAGNOSIS — I2510 Atherosclerotic heart disease of native coronary artery without angina pectoris: Secondary | ICD-10-CM | POA: Diagnosis not present

## 2020-12-07 DIAGNOSIS — Z7902 Long term (current) use of antithrombotics/antiplatelets: Secondary | ICD-10-CM | POA: Diagnosis not present

## 2020-12-07 DIAGNOSIS — Z9181 History of falling: Secondary | ICD-10-CM | POA: Diagnosis not present

## 2020-12-07 DIAGNOSIS — I0981 Rheumatic heart failure: Secondary | ICD-10-CM | POA: Diagnosis not present

## 2020-12-08 DIAGNOSIS — E8779 Other fluid overload: Secondary | ICD-10-CM | POA: Diagnosis not present

## 2020-12-08 DIAGNOSIS — Z992 Dependence on renal dialysis: Secondary | ICD-10-CM | POA: Diagnosis not present

## 2020-12-08 DIAGNOSIS — D631 Anemia in chronic kidney disease: Secondary | ICD-10-CM | POA: Diagnosis not present

## 2020-12-08 DIAGNOSIS — N186 End stage renal disease: Secondary | ICD-10-CM | POA: Diagnosis not present

## 2020-12-08 DIAGNOSIS — N2581 Secondary hyperparathyroidism of renal origin: Secondary | ICD-10-CM | POA: Diagnosis not present

## 2020-12-11 DIAGNOSIS — N186 End stage renal disease: Secondary | ICD-10-CM | POA: Diagnosis not present

## 2020-12-11 DIAGNOSIS — Z992 Dependence on renal dialysis: Secondary | ICD-10-CM | POA: Diagnosis not present

## 2020-12-11 DIAGNOSIS — E8779 Other fluid overload: Secondary | ICD-10-CM | POA: Diagnosis not present

## 2020-12-11 DIAGNOSIS — D631 Anemia in chronic kidney disease: Secondary | ICD-10-CM | POA: Diagnosis not present

## 2020-12-11 DIAGNOSIS — N2581 Secondary hyperparathyroidism of renal origin: Secondary | ICD-10-CM | POA: Diagnosis not present

## 2020-12-12 ENCOUNTER — Encounter: Payer: Self-pay | Admitting: Family Medicine

## 2020-12-12 ENCOUNTER — Other Ambulatory Visit: Payer: Self-pay

## 2020-12-12 ENCOUNTER — Ambulatory Visit (INDEPENDENT_AMBULATORY_CARE_PROVIDER_SITE_OTHER): Payer: Medicare Other | Admitting: Family Medicine

## 2020-12-12 VITALS — BP 130/80 | HR 79 | Temp 97.9°F | Wt 187.0 lb

## 2020-12-12 DIAGNOSIS — M546 Pain in thoracic spine: Secondary | ICD-10-CM

## 2020-12-12 DIAGNOSIS — K219 Gastro-esophageal reflux disease without esophagitis: Secondary | ICD-10-CM

## 2020-12-12 DIAGNOSIS — I5042 Chronic combined systolic (congestive) and diastolic (congestive) heart failure: Secondary | ICD-10-CM

## 2020-12-12 DIAGNOSIS — N186 End stage renal disease: Secondary | ICD-10-CM | POA: Diagnosis not present

## 2020-12-12 DIAGNOSIS — I480 Paroxysmal atrial fibrillation: Secondary | ICD-10-CM | POA: Diagnosis not present

## 2020-12-12 DIAGNOSIS — Z09 Encounter for follow-up examination after completed treatment for conditions other than malignant neoplasm: Secondary | ICD-10-CM

## 2020-12-12 DIAGNOSIS — Z992 Dependence on renal dialysis: Secondary | ICD-10-CM | POA: Diagnosis not present

## 2020-12-12 MED ORDER — PANTOPRAZOLE SODIUM 40 MG PO TBEC: 40.0000 mg | DELAYED_RELEASE_TABLET | Freq: Every day | ORAL | 0 refills | Status: AC

## 2020-12-12 NOTE — Patient Instructions (Signed)
Gastroesophageal Reflux Disease, Adult  Gastroesophageal reflux (GER) happens when acid from the stomach flows up into the tube that connects the mouth and the stomach (esophagus). Normally, food travels down the esophagus and stays in the stomach to be digested. With GER, food and stomach acid sometimes move back up into the esophagus. You may have a disease called gastroesophageal reflux disease (GERD) if the reflux:  Happens often.  Causes frequent or very bad symptoms.  Causes problems such as damage to the esophagus. When this happens, the esophagus becomes sore and swollen. Over time, GERD can make small holes (ulcers) in the lining of the esophagus. What are the causes? This condition is caused by a problem with the muscle between the esophagus and the stomach. When this muscle is weak or not normal, it does not close properly to keep food and acid from coming back up from the stomach. The muscle can be weak because of:  Tobacco use.  Pregnancy.  Having a certain type of hernia (hiatal hernia).  Alcohol use.  Certain foods and drinks, such as coffee, chocolate, onions, and peppermint. What increases the risk?  Being overweight.  Having a disease that affects your connective tissue.  Taking NSAIDs, such a ibuprofen. What are the signs or symptoms?  Heartburn.  Difficult or painful swallowing.  The feeling of having a lump in the throat.  A bitter taste in the mouth.  Bad breath.  Having a lot of saliva.  Having an upset or bloated stomach.  Burping.  Chest pain. Different conditions can cause chest pain. Make sure you see your doctor if you have chest pain.  Shortness of breath or wheezing.  A long-term cough or a cough at night.  Wearing away of the surface of teeth (tooth enamel).  Weight loss. How is this treated?  Making changes to your diet.  Taking medicine.  Having surgery. Treatment will depend on how bad your symptoms are. Follow these  instructions at home: Eating and drinking  Follow a diet as told by your doctor. You may need to avoid foods and drinks such as: ? Coffee and tea, with or without caffeine. ? Drinks that contain alcohol. ? Energy drinks and sports drinks. ? Bubbly (carbonated) drinks or sodas. ? Chocolate and cocoa. ? Peppermint and mint flavorings. ? Garlic and onions. ? Horseradish. ? Spicy and acidic foods. These include peppers, chili powder, curry powder, vinegar, hot sauces, and BBQ sauce. ? Citrus fruit juices and citrus fruits, such as oranges, lemons, and limes. ? Tomato-based foods. These include red sauce, chili, salsa, and pizza with red sauce. ? Fried and fatty foods. These include donuts, french fries, potato chips, and high-fat dressings. ? High-fat meats. These include hot dogs, rib eye steak, sausage, ham, and bacon. ? High-fat dairy items, such as whole milk, butter, and cream cheese.  Eat small meals often. Avoid eating large meals.  Avoid drinking large amounts of liquid with your meals.  Avoid eating meals during the 2-3 hours before bedtime.  Avoid lying down right after you eat.  Do not exercise right after you eat.   Lifestyle  Do not smoke or use any products that contain nicotine or tobacco. If you need help quitting, ask your doctor.  Try to lower your stress. If you need help doing this, ask your doctor.  If you are overweight, lose an amount of weight that is healthy for you. Ask your doctor about a safe weight loss goal.   General instructions  Pay attention to any changes in your symptoms.  Take over-the-counter and prescription medicines only as told by your doctor.  Do not take aspirin, ibuprofen, or other NSAIDs unless your doctor says it is okay.  Wear loose clothes. Do not wear anything tight around your waist.  Raise (elevate) the head of your bed about 6 inches (15 cm). You may need to use a wedge to do this.  Avoid bending over if this makes your  symptoms worse.  Keep all follow-up visits. Contact a doctor if:  You have new symptoms.  You lose weight and you do not know why.  You have trouble swallowing or it hurts to swallow.  You have wheezing or a cough that keeps happening.  You have a hoarse voice.  Your symptoms do not get better with treatment. Get help right away if:  You have sudden pain in your arms, neck, jaw, teeth, or back.  You suddenly feel sweaty, dizzy, or light-headed.  You have chest pain or shortness of breath.  You vomit and the vomit is green, yellow, or black, or it looks like blood or coffee grounds.  You faint.  Your poop (stool) is red, bloody, or black.  You cannot swallow, drink, or eat. These symptoms may represent a serious problem that is an emergency. Do not wait to see if the symptoms will go away. Get medical help right away. Call your local emergency services (911 in the [place] Do not drive yourself to the hospital. Summary  If a person has gastroesophageal reflux disease (GERD), food and stomach acid move back up into the esophagus and cause symptoms or problems such as damage to the esophagus.  Treatment will depend on how bad your symptoms are.  Follow a diet as told by your doctor.  Take all medicines only as told by your doctor. This information is not intended to replace advice given to you by your health care provider. Make sure you discuss any questions you have with your health care provider. Document Revised: 04/23/2020 Document Reviewed: 04/23/2020 Elsevier Patient Education  2021 [place] for Gastroesophageal Reflux Disease, Adult When you have gastroesophageal reflux disease (GERD), the foods you eat and your eating habits are very important. Choosing the right foods can help ease your discomfort. Think about working with a food expert (dietitian) to help you make good choices. What are tips for following this plan? Reading food labels  Look  for foods that are low in saturated fat. Foods that may help with your symptoms include: ? Foods that have less than 5% of daily value (DV) of fat. ? Foods that have 0 grams of trans fat. Cooking  Do not fry your food.  Cook your food by baking, steaming, grilling, or broiling. These are all methods that do not need a lot of fat for cooking.  To add flavor, try to use herbs that are low in spice and acidity. Meal planning  Choose healthy foods that are low in fat, such as: ? Fruits and vegetables. ? Whole grains. ? Low-fat dairy products. ? Lean meats, fish, and poultry.  Eat small meals often instead of eating 3 large meals each day. Eat your meals slowly in a place where you are relaxed. Avoid bending over or lying down until 2-3 hours after eating.  Limit high-fat foods such as fatty meats or fried foods.  Limit your intake of fatty foods, such as oils, butter, and shortening.  Avoid the following as told  by your doctor: ? Foods that cause symptoms. These may be different for different people. Keep a food diary to keep track of foods that cause symptoms. ? Alcohol. ? Drinking a lot of liquid with meals. ? Eating meals during the 2-3 hours before bed.   Lifestyle  Stay at a healthy weight. Ask your doctor what weight is healthy for you. If you need to lose weight, work with your doctor to do so safely.  Exercise for at least 30 minutes on 5 or more days each week, or as told by your doctor.  Wear loose-fitting clothes.  Do not smoke or use any products that contain nicotine or tobacco. If you need help quitting, ask your doctor.  Sleep with the head of your bed higher than your feet. Use a wedge under the mattress or blocks under the bed frame to raise the head of the bed.  Chew sugar-free gum after meals. What foods should eat? Eat a healthy, well-balanced diet of fruits, vegetables, whole grains, low-fat dairy products, lean meats, fish, and poultry. Each person is  different. Foods that may cause symptoms in one person may not cause any symptoms in another person. Work with your doctor to find foods that are safe for you. The items listed above may not be a complete list of what you can eat and drink. Contact a food expert for more options.   What foods should I avoid? Limiting some of these foods may help in managing the symptoms of GERD. Everyone is different. Talk with a food expert or your doctor to help you find the exact foods to avoid, if any. Fruits Any fruits prepared with added fat. Any fruits that cause symptoms. For some people, this may include citrus fruits, such as oranges, grapefruit, pineapple, and lemons. Vegetables Deep-fried vegetables. [place] fries. Any vegetables prepared with added fat. Any vegetables that cause symptoms. For some people, this may include tomatoes and tomato products, chili peppers, onions and garlic, and horseradish. Grains Pastries or quick breads with added fat. Meats and other proteins High-fat meats, such as fatty beef or pork, hot dogs, ribs, ham, sausage, salami, and bacon. Fried meat or protein, including fried fish and fried chicken. Nuts and nut butters, in large amounts. Dairy Whole milk and chocolate milk. Sour cream. Cream. Ice cream. Cream cheese. Milkshakes. Fats and oils Butter. Margarine. Shortening. Ghee. Beverages Coffee and tea, with or without caffeine. Carbonated beverages. Sodas. Energy drinks. Fruit juice made with acidic fruits, such as orange or grapefruit. Tomato juice. Alcoholic drinks. Sweets and desserts Chocolate and cocoa. Donuts. Seasonings and condiments Pepper. Peppermint and spearmint. Added salt. Any condiments, herbs, or seasonings that cause symptoms. For some people, this may include curry, hot sauce, or vinegar-based salad dressings. The items listed above may not be a complete list of what you should not eat and drink. Contact a food expert for more options. Questions to  ask your doctor Diet and lifestyle changes are often the first steps that are taken to manage symptoms of GERD. If diet and lifestyle changes do not help, talk with your doctor about taking medicines. Where to find more information  International Foundation for Gastrointestinal Disorders: aboutgerd.org Summary  When you have GERD, food and lifestyle choices are very important in easing your symptoms.  Eat small meals often instead of 3 large meals a day. Eat your meals slowly and in a place where you are relaxed.  Avoid bending over or lying down until 2-3 hours after eating.  Limit high-fat foods such as fatty meats or fried foods. This information is not intended to replace advice given to you by your health care provider. Make sure you discuss any questions you have with your health care provider. Document Revised: 04/23/2020 Document Reviewed: 04/23/2020 Elsevier Patient Education  2021 [place].  Chronic Back Pain When back pain lasts longer than 3 months, it is called chronic back pain. Pain may get worse at certain times (flare-ups). There are things you can do at home to manage your pain. Follow these instructions at home: Pay attention to any changes in your symptoms. Take these actions to help with your pain: Managing pain and stiffness  If told, put ice on the painful area. Your doctor may tell you to use ice for 24-48 hours after the flare-up starts. To do this: ? Put ice in a plastic bag. ? Place a towel between your skin and the bag. ? Leave the ice on for 20 minutes, 2-3 times a day.  If told, put heat on the painful area. Do this as often as told by your doctor. Use the heat source that your doctor recommends, such as a moist heat pack or a heating pad. ? Place a towel between your skin and the heat source. ? Leave the heat on for 20-30 minutes. ? Take off the heat if your skin turns bright red. This is especially important if you are unable to feel pain, heat, or  cold. You may have a greater risk of getting burned.  Soak in a warm bath. This can help relieve pain.      Activity  Avoid bending and other activities that make pain worse.  When standing: ? Keep your upper back and neck straight. ? Keep your shoulders pulled back. ? Avoid slouching.  When sitting: ? Keep your back straight. ? Relax your shoulders. Do not round your shoulders or pull them backward.  Do not sit or stand in one place for long periods of time.  Take short rest breaks during the day. Lying down or standing is usually better than sitting. Resting can help relieve pain.  When sitting or lying down for a long time, do some mild activity or stretching. This will help to prevent stiffness and pain.  Get regular exercise. Ask your doctor what activities are safe for you.  Do not lift anything that is heavier than 10 lb (4.5 kg) or the limit that you are told, until your doctor says that it is safe.  To prevent injury when you lift things: ? Bend your knees. ? Keep the weight close to your body. ? Avoid twisting.  Sleep on a firm mattress. Try lying on your side with your knees slightly bent. If you lie on your back, put a pillow under your knees.   Medicines  Treatment may include medicines for pain and swelling taken by mouth or put on the skin, prescription pain medicine, or muscle relaxants.  Take over-the-counter and prescription medicines only as told by your doctor.  Ask your doctor if the medicine prescribed to you: ? Requires you to avoid driving or using machinery. ? Can cause trouble pooping (constipation). You may need to take these actions to prevent or treat trouble pooping:  Drink enough fluid to keep your pee (urine) pale yellow.  Take over-the-counter or prescription medicines.  Eat foods that are high in fiber. These include beans, whole grains, and fresh fruits and vegetables.  Limit foods that are high in fat  and sugars. These include  fried or sweet foods. General instructions  Do not use any products that contain nicotine or tobacco, such as cigarettes, e-cigarettes, and chewing tobacco. If you need help quitting, ask your doctor.  Keep all follow-up visits as told by your doctor. This is important. Contact a doctor if:  Your pain does not get better with rest or medicine.  Your pain gets worse, or you have new pain.  You have a high fever.  You lose weight very quickly.  You have trouble doing your normal activities. Get help right away if:  One or both of your legs or feet feel weak.  One or both of your legs or feet lose feeling (have numbness).  You have trouble controlling when you poop (have a bowel movement) or pee (urinate).  You have bad back pain and: ? You feel like you may vomit (nauseous), or you vomit. ? You have pain in your belly (abdomen). ? You have shortness of breath. ? You faint. Summary  When back pain lasts longer than 3 months, it is called chronic back pain.  Pain may get worse at certain times (flare-ups).  Use ice and heat as told by your doctor. Your doctor may tell you to use ice after flare-ups. This information is not intended to replace advice given to you by your health care provider. Make sure you discuss any questions you have with your health care provider. Document Revised: 11/23/2019 Document Reviewed: 11/23/2019 Elsevier Patient Education  2021 [place].  Heart Failure, Diagnosis  Heart failure means that your heart is not able to pump blood in the right way. This makes it hard for your body to work well. Heart failure is usually a long-term (chronic) condition. You must take good care of yourself and follow your treatment plan from your doctor. What are the causes?  High blood pressure.  Buildup of cholesterol and fat in the arteries.  Heart attack. This injures the heart muscle.  Heart valves that do not open and close properly.  Damage of the  heart muscle. This is also called cardiomyopathy.  Infection of the heart muscle. This is also called myocarditis.  Lung disease. What increases the risk?  Getting older. The risk of heart failure goes up as a person ages.  Being overweight.  Being male.  Use tobacco or nicotine products.  Abusing alcohol or drugs.  Having taken medicines that can damage the heart.  Having any of these conditions: ? Diabetes. ? Abnormal heart rhythms. ? Thyroid problems. ? Low blood counts (anemia).  Having a family history of heart failure. What are the signs or symptoms?  Shortness of breath.  Coughing.  Swelling of the feet, ankles, legs, or belly.  Losing or gaining weight for no reason.  Trouble breathing.  Waking from sleep because of the need to sit up and get more air.  Fast heartbeat.  Being very tired.  Feeling dizzy, or feeling like you may pass out (faint).  Having no desire to eat.  Feeling like you may vomit (nauseous).  Peeing (urinating) more at night.  Feeling confused. How is this treated? This condition may be treated with:  Medicines. These can be given to treat blood pressure and to make the heart muscles stronger.  Changes in your daily life. These may include: ? Eating a healthy diet. ? Staying at a healthy body weight. ? Quitting tobacco, alcohol, and drug use. ? Doing exercises. ? Participating in a cardiac rehabilitation program.  This program helps you improve your health through exercise, education, and counseling.  Surgery. Surgery can be done to open blocked valves, or to put devices in the heart, such as pacemakers.  A donor heart (heart transplant). You will receive a healthy heart from a donor. Follow these instructions at home:  Treat other conditions as told by your doctor. These may include high blood pressure, diabetes, thyroid disease, or abnormal heart rhythms.  Learn as much as you can about heart failure.  Get support as  you need it.  Keep all follow-up visits. Summary  Heart failure means that your heart is not able to pump blood in the right way.  This condition is often caused by high blood pressure, heart attack, or damage of the heart muscle.  Symptoms of this condition include shortness of breath and swelling of the feet, ankles, legs, or belly. You may also feel very tired or feel like you may vomit.  You may be treated with medicines, surgery, or changes in your daily life.  Treat other health conditions as told by your doctor. This information is not intended to replace advice given to you by your health care provider. Make sure you discuss any questions you have with your health care provider. Document Revised: 05/05/2020 Document Reviewed: 05/05/2020 Elsevier Patient Education  2021 [place] With Heart Failure Heart failure is a long-term (chronic) condition in which the heart cannot pump enough blood through the body. When this happens, parts of the body do not get the blood and oxygen they need. There is no cure for heart failure at this time, so it is important for you to take good care of yourself and follow the treatment plan you set with your health care provider. If you are living with heart failure, there are ways to help you manage the disease. How to manage lifestyle changes Living with heart failure requires you to make changes in your life. Your health care team will teach you about the changes you need to make in order to relieve your symptoms and lower your risk of going to the hospital. Work with your health care provider to develop a treatment plan that works for you. Activity  Ask your health care provider about attending cardiac rehabilitation. These programs include aerobic physical activity, which provides many benefits for your heart.  If no cardiac rehabilitation program is available, ask your health care provider what aerobic exercises are safe for you to  do.  Return to your normal activities as told by your health care provider. Ask your health care provider what activities are safe for you.  Pace your daily activities and allow time for rest as needed. Managing stress It is normal to have many emotions about your diagnosis, such as fear, sadness, anger, and loss. If you feel any of these emotions and need help coping, contact your health care provider. Here are some ways to help yourself manage these emotions:  Talk to friends and family members about your condition. They can give you support and guidance. Explain your symptoms to them and, if comfortable, invite them to attend appointments or rehabilitation with you.  Join a support group for people with chronic heart failure. Talking with other people who have the same symptoms may give you new ways of coping with your disease and your emotions.  Accept help from others. Do not be ashamed if you need help with certain tasks.  Use stress management techniques, such as meditation, breathing exercises,  or listening to relaxing music. Conditions such as depression and anxiety are common in persons with heart failure. Pay attention to changes in your mood, emotions, and stress levels. Tell your health care provider if you have any of the following symptoms:  Trouble sleeping or a change in your sleeping patterns.  Feeling sad, down, or depressed more often than not, every day for more than 2 weeks.  Losing interest in activities you normally enjoy.  Feeling irritable or crying for no reason.  Finding yourself worrying about the future often. Work You may need to develop a plan with your health care provider if heart failure interferes with your ability to work. This may include:  Reducing your work hours.  Finding functions that are less active or require less effort.  Planning rest periods during your work hours. Travel  Talk with your health care provider if you plan to travel.  There may be circumstances in which your health care provider recommends that you do not travel or that you delay travel until your condition is under control.  When you travel, bring your medicine and a list of your medicines. If you are traveling by air, keep your medicines with you in a carry-on bag.  Consider finding a medical facility in the area you will be traveling to and determine what your health insurance will cover.  If you will be traveling by public transportation (airplane, train, bus), contact the company prior to traveling if you have special needs. This may include needs related to diet, oxygen, a wheelchair, a seating request, or help with luggage.  If you use oxygen, make sure to bring enough oxygen with you.  If you have a battery-powered device, bring a fully charged extra battery with you.  If you have a device, bring a note from your health care provider and inform all security screening personnel that you have the device. You may need to go through special screening for safety. Sexual activity  Ask your health care provider when it is safe for you to resume sexual activity.  You may need to start slowly and gradually increase intimacy. You can increase intimacy by doing such things as caressing, touching, and holding each other.  Get regular exercise as told by your health care provider. This can benefit your sex life by building strength and endurance.   Sleep If your condition interferes with your sleep, find ways to improve your sleep quality, such as:  Sleep lying on your side, or sleep with your head elevated by raising the head of your bed or using multiple pillows.  Ask your health care provider about screening for sleep apnea.  Try to go to sleep and wake up at the same times every day.  Sleep in a dark, cool room.  Do not do any physical activity or eating for a few hours before bedtime.  Plan rest periods during the day, but do not take long naps  during the day.   Where to find support  Consider talking with: ? Family members. ? Close friends. ? A mental health professional or therapist. ? A member of your church, faith, or community group.  Other sources of support include: ? Local support groups. Ask your health care provider about groups near you. ? Online support groups, such as those found through the American Heart Association: supportnetwork.heart.org ? Local home care agencies, community agencies, or social agencies. ? A palliative care specialist. Palliative care can help you manage symptoms, promote comfort, improve  quality of life, and maintain dignity. Where to find more information  American Heart Association: heart.org  National Heart, Lung, and Blood Institute: https://www.hartman-hill.biz/  Centers for Disease Control and Prevention: https://www.reeves.com/  [place]: SolutionApps.it Contact a health care provider if:  You have a rapid weight gain.  You have increasing shortness of breath that is unusual for you.  You are unable to participate in your usual physical activities.  You tire easily.  You have difficulty sleeping, such as: ? You wake up feeling short of breath. ? You have to use more pillows to raise your head in order to sleep.  You cough more than normal, especially with physical activity.  You have any swelling or more swelling in areas such as your hands, feet, ankles, or abdomen.  You become dizzy or light-headed when you stand up.  You have changes to your appetite.  You have symptoms of depression or anxiety. Get help right away if:  You have difficulty breathing.  You notice or your family notices a change in your awareness, such as having trouble staying awake or having difficulty with concentration.  You have pain or discomfort in your chest.  You have an episode of fainting (syncope).  You feel like your heart is beating quickly (palpitations).  You have  extreme feelings of sadness or loss of hope, or you have thoughts about hurting yourself or others. These symptoms may represent a serious problem that is an emergency. Do not wait to see if the symptoms will go away. Get medical help right away. Call your local emergency services (911 in the [place] Do not drive yourself to the hospital. Summary  There is no cure for heart failure, so it is important for you to take good care of yourself and follow the treatment plan set by your health care provider.  Ask your health care provider about attending cardiac rehabilitation. These programs include aerobic physical activity, which provides many benefits for your heart.  It is normal to have many emotions about your diagnosis, such as fear, sadness, anger, and loss. If you feel any of these emotions and need help coping, contact your health care provider.  You may need to develop a plan with your health care provider if heart failure interferes with your ability to work. This information is not intended to replace advice given to you by your health care provider. Make sure you discuss any questions you have with your health care provider. Document Revised: 05/28/2020 Document Reviewed: 05/28/2020 Elsevier Patient Education  2021 [employer].

## 2020-12-12 NOTE — Progress Notes (Signed)
Subjective:    Patient ID: Kenneth [name], male    DOB: 02-23-1956, 65 y.o.   MRN: [id number]  No chief complaint on file.   HPI Patient is a 65 year old male with past medical history significant for ESRD on HD (T, Th, Sa), HTN, A. fib, multivessel CAD combined systolic and diastolic HF, cardiomyopathy who was lost to follow-up and seen today for HFU.  Pt admitted 2/1-2/2 for acute hypoxic respiratory failure and CHF exacerbation 2/2 volume overload.  Echo with EF Q000111Q, grade 2 diastolic dysfunction.    Pt denies SOB, LE edema.  Pt notes intermittent CP, tingling in stomach, and right-sided thoracic back pain.  Patient notes CP with nausea/vomiting when laying down.  Denies history of heartburn though Protonix and Pepcid listed on med list.  Patient unable to recall foods he is eating.  Denies alcohol use.  Endorses compliance with meds and HD.  Past Medical History:  Diagnosis Date  . Acute CHF ([place]) 01/2018  . Cardiomyopathy secondary    likely related to HTN heart disease; possibly ETOH related as well  . Chronic combined systolic and diastolic heart failure (HCC)    Echocardiogram 09/22/11: Moderate LVH, EF 99991111, grade 3 diastolic dysfunction, mild MR, moderate to severe LAE, mild RVE, mild to moderate TR, small to moderate pericardial effusion  . Coronary artery disease    not felt to be candidate for CABG 08/2018; medical thearpy recomended due to high risk of PCI   . Dysrhythmia    aflutter 04/2016, afib 02/2018, not felt to be a candidate for anticoagulation due to non-compliance and ETOH  . ESRD (end stage renal disease) on dialysis ([place])    due to hypertensive nephrosclerosis; TTS; [address] (09/01/2018)  . History of alcohol abuse   . Hypertension   . Myocardial infarction [facility])    " mild " per daughter  . PEA (Pulseless electrical activity) ([place])    PEA arrest 03/09/18, treated empirically for hyperkalemia, shock x1 for WCT, given amiodarone, ROSC after 10 min of ACLS     No Known Allergies  ROS General: Denies fever, chills, night sweats, changes in weight, changes in appetite HEENT: Denies headaches, ear pain, changes in vision, rhinorrhea, sore throat CV: Denies CP, palpitations, SOB, orthopnea Pulm: Denies SOB, cough, wheezing GI: Denies diarrhea, constipation  + "tingling" in abdomen, nausea, vomiting GU: Denies dysuria, hematuria, frequency Msk: Denies muscle cramps, joint pains Neuro: Denies weakness, numbness, tingling Skin: Denies rashes, bruising Psych: Denies depression, anxiety, hallucinations     Objective:    Blood pressure 130/80, pulse 79, temperature 97.9 F (36.6 C), temperature source Oral, weight 187 lb (84.8 kg), SpO2 99 %.  Gen. Pleasant, well-nourished, in no distress, normal affect   HEENT: [place]/AT, face symmetric, conjunctiva clear, no scleral icterus, PERRLA, EOMI, nares patent without drainage Lungs: no accessory muscle use, CTAB, no wheezes or rales Cardiovascular: RRR, no m/r/g, no peripheral edema.  Audible bruit in LUE fistula Musculoskeletal: No deformities, no cyanosis or clubbing, normal tone Neuro:  A&Ox3, CN II-XII intact, normal gait Skin:  Warm, no lesions/ rash   Wt Readings from Last 3 Encounters:  12/12/20 187 lb (84.8 kg)  11/28/20 186 lb (84.4 kg)  11/22/20 203 lb 14.8 oz (92.5 kg)    Lab Results  Component Value Date   WBC 6.5 11/27/2020   HGB 13.1 11/27/2020   HCT 38.1 (L) 11/27/2020   PLT 267 11/27/2020   GLUCOSE 129 (H) 11/28/2020   CHOL 161 10/27/2020  TRIG 90 11/19/2020   HDL 50 10/27/2020   LDLCALC 94 10/27/2020   ALT 10 11/19/2020   AST 14 (L) 11/19/2020   NA 139 11/28/2020   K 4.2 11/28/2020   CL 96 (L) 11/28/2020   CREATININE 10.51 (H) 11/28/2020   BUN 49 (H) 11/28/2020   CO2 23 11/28/2020   TSH 0.825 12/03/2017   INR 1.0 10/26/2020   HGBA1C 5.7 (H) 05/12/2020   MICROALBUR 71.80 (H) 09/22/2011    Assessment/Plan:  Hospital discharge follow-up -notes and results  reviewed from hospitalization -TCM phone call made and reviewed  Chronic combined systolic (congestive) and diastolic (congestive) heart failure ([place]) -Echo with EF Q000111Q, grade 2 diastolic dysfunction -discussed limiting sodium and fluid intake -continue current meds including imdur 90 mg daily, norvasc 10 mg, toprol xl 25 mg BID -continue f/u with Cardiology  ESRD on hemodialysis ([place]) -Continue HD T, Th, Sa -continue fosrenol 1000 mg TID, fluid restriction 1200 mL/d -Continue follow-up with nephrology -Renal diet -Renally dose meds and avoid nephrotoxic medications  Gastroesophageal reflux disease, unspecified whether esophagitis present  -Avoiding food medical symptoms -Given handout -Famotidine 20 mg twice daily with the home medication, however patient denies. -Rx for protonix sent to pharmacy -Continue to monitor.  Consider f/u with GI - Plan: pantoprazole (PROTONIX) 40 MG tablet  Right-sided thoracic back pain, unspecified chronicity -like MSK, also consider GERD -discussed supportive care including topical analgesics, heat, ice, massage -Continue to monitor  Paroxysmal atrial fibrillation (HCC) -plavix 75 mg daily -discussed the importance of compliance -continue f/u with Cards  F/u in 1 month, sooner if needed  Grier Mitts, MD

## 2020-12-13 DIAGNOSIS — N2581 Secondary hyperparathyroidism of renal origin: Secondary | ICD-10-CM | POA: Diagnosis not present

## 2020-12-13 DIAGNOSIS — Z992 Dependence on renal dialysis: Secondary | ICD-10-CM | POA: Diagnosis not present

## 2020-12-13 DIAGNOSIS — N186 End stage renal disease: Secondary | ICD-10-CM | POA: Diagnosis not present

## 2020-12-13 DIAGNOSIS — E8779 Other fluid overload: Secondary | ICD-10-CM | POA: Diagnosis not present

## 2020-12-13 DIAGNOSIS — D631 Anemia in chronic kidney disease: Secondary | ICD-10-CM | POA: Diagnosis not present

## 2020-12-14 ENCOUNTER — Telehealth: Payer: Self-pay | Admitting: Family Medicine

## 2020-12-14 DIAGNOSIS — N186 End stage renal disease: Secondary | ICD-10-CM | POA: Diagnosis not present

## 2020-12-14 DIAGNOSIS — J9601 Acute respiratory failure with hypoxia: Secondary | ICD-10-CM | POA: Diagnosis not present

## 2020-12-14 DIAGNOSIS — I132 Hypertensive heart and chronic kidney disease with heart failure and with stage 5 chronic kidney disease, or end stage renal disease: Secondary | ICD-10-CM | POA: Diagnosis not present

## 2020-12-14 DIAGNOSIS — I5043 Acute on chronic combined systolic (congestive) and diastolic (congestive) heart failure: Secondary | ICD-10-CM | POA: Diagnosis not present

## 2020-12-14 DIAGNOSIS — I083 Combined rheumatic disorders of mitral, aortic and tricuspid valves: Secondary | ICD-10-CM | POA: Diagnosis not present

## 2020-12-14 DIAGNOSIS — I0981 Rheumatic heart failure: Secondary | ICD-10-CM | POA: Diagnosis not present

## 2020-12-14 NOTE — Telephone Encounter (Signed)
Pts daughter is calling in stating that the pt is out of Rx famotidine (PEPCID) 20 MG  Pharm:  [facility] on [address]  Daughter would like to have a call back to let her know when it has been sent to the pharmacy.

## 2020-12-15 DIAGNOSIS — N186 End stage renal disease: Secondary | ICD-10-CM | POA: Diagnosis not present

## 2020-12-15 DIAGNOSIS — D631 Anemia in chronic kidney disease: Secondary | ICD-10-CM | POA: Diagnosis not present

## 2020-12-15 DIAGNOSIS — Z992 Dependence on renal dialysis: Secondary | ICD-10-CM | POA: Diagnosis not present

## 2020-12-15 DIAGNOSIS — E8779 Other fluid overload: Secondary | ICD-10-CM | POA: Diagnosis not present

## 2020-12-15 DIAGNOSIS — N2581 Secondary hyperparathyroidism of renal origin: Secondary | ICD-10-CM | POA: Diagnosis not present

## 2020-12-17 ENCOUNTER — Inpatient Hospital Stay (HOSPITAL_COMMUNITY)
Admission: EM | Admit: 2020-12-17 | Discharge: 2020-12-19 | DRG: 280 | Disposition: A | Discharge status: Home / Self Care | Payer: Medicare Other | Attending: Internal Medicine | Admitting: Internal Medicine

## 2020-12-17 ENCOUNTER — Other Ambulatory Visit: Payer: Self-pay

## 2020-12-17 ENCOUNTER — Encounter (HOSPITAL_COMMUNITY): Payer: Self-pay | Admitting: Emergency Medicine

## 2020-12-17 ENCOUNTER — Emergency Department (HOSPITAL_COMMUNITY): Payer: Medicare Other

## 2020-12-17 DIAGNOSIS — R7989 Other specified abnormal findings of blood chemistry: Secondary | ICD-10-CM | POA: Diagnosis present

## 2020-12-17 DIAGNOSIS — I214 Non-ST elevation (NSTEMI) myocardial infarction: Secondary | ICD-10-CM | POA: Diagnosis present

## 2020-12-17 DIAGNOSIS — R0689 Other abnormalities of breathing: Secondary | ICD-10-CM | POA: Diagnosis not present

## 2020-12-17 DIAGNOSIS — I517 Cardiomegaly: Secondary | ICD-10-CM | POA: Diagnosis not present

## 2020-12-17 DIAGNOSIS — I5043 Acute on chronic combined systolic (congestive) and diastolic (congestive) heart failure: Secondary | ICD-10-CM | POA: Diagnosis not present

## 2020-12-17 DIAGNOSIS — N186 End stage renal disease: Secondary | ICD-10-CM | POA: Diagnosis not present

## 2020-12-17 DIAGNOSIS — R0902 Hypoxemia: Secondary | ICD-10-CM | POA: Diagnosis not present

## 2020-12-17 DIAGNOSIS — R778 Other specified abnormalities of plasma proteins: Secondary | ICD-10-CM | POA: Diagnosis present

## 2020-12-17 DIAGNOSIS — R0602 Shortness of breath: Secondary | ICD-10-CM | POA: Diagnosis not present

## 2020-12-17 DIAGNOSIS — I252 Old myocardial infarction: Secondary | ICD-10-CM

## 2020-12-17 DIAGNOSIS — I340 Nonrheumatic mitral (valve) insufficiency: Secondary | ICD-10-CM | POA: Diagnosis present

## 2020-12-17 DIAGNOSIS — Z7902 Long term (current) use of antithrombotics/antiplatelets: Secondary | ICD-10-CM

## 2020-12-17 DIAGNOSIS — Z992 Dependence on renal dialysis: Secondary | ICD-10-CM

## 2020-12-17 DIAGNOSIS — J9601 Acute respiratory failure with hypoxia: Secondary | ICD-10-CM | POA: Diagnosis not present

## 2020-12-17 DIAGNOSIS — I429 Cardiomyopathy, unspecified: Secondary | ICD-10-CM | POA: Diagnosis present

## 2020-12-17 DIAGNOSIS — I25119 Atherosclerotic heart disease of native coronary artery with unspecified angina pectoris: Secondary | ICD-10-CM | POA: Diagnosis present

## 2020-12-17 DIAGNOSIS — J9620 Acute and chronic respiratory failure, unspecified whether with hypoxia or hypercapnia: Secondary | ICD-10-CM | POA: Diagnosis present

## 2020-12-17 DIAGNOSIS — E8770 Fluid overload, unspecified: Secondary | ICD-10-CM | POA: Diagnosis present

## 2020-12-17 DIAGNOSIS — I509 Heart failure, unspecified: Secondary | ICD-10-CM

## 2020-12-17 DIAGNOSIS — E875 Hyperkalemia: Secondary | ICD-10-CM | POA: Diagnosis present

## 2020-12-17 DIAGNOSIS — Z8674 Personal history of sudden cardiac arrest: Secondary | ICD-10-CM

## 2020-12-17 DIAGNOSIS — I2510 Atherosclerotic heart disease of native coronary artery without angina pectoris: Secondary | ICD-10-CM | POA: Diagnosis present

## 2020-12-17 DIAGNOSIS — I132 Hypertensive heart and chronic kidney disease with heart failure and with stage 5 chronic kidney disease, or end stage renal disease: Principal | ICD-10-CM | POA: Diagnosis present

## 2020-12-17 DIAGNOSIS — Z20822 Contact with and (suspected) exposure to covid-19: Secondary | ICD-10-CM | POA: Diagnosis present

## 2020-12-17 DIAGNOSIS — Z79899 Other long term (current) drug therapy: Secondary | ICD-10-CM

## 2020-12-17 DIAGNOSIS — Z9114 Patient's other noncompliance with medication regimen: Secondary | ICD-10-CM

## 2020-12-17 DIAGNOSIS — Z87891 Personal history of nicotine dependence: Secondary | ICD-10-CM

## 2020-12-17 DIAGNOSIS — D631 Anemia in chronic kidney disease: Secondary | ICD-10-CM | POA: Diagnosis present

## 2020-12-17 DIAGNOSIS — I083 Combined rheumatic disorders of mitral, aortic and tricuspid valves: Secondary | ICD-10-CM | POA: Diagnosis not present

## 2020-12-17 DIAGNOSIS — I0981 Rheumatic heart failure: Secondary | ICD-10-CM | POA: Diagnosis not present

## 2020-12-17 DIAGNOSIS — M898X9 Other specified disorders of bone, unspecified site: Secondary | ICD-10-CM | POA: Diagnosis present

## 2020-12-17 DIAGNOSIS — K219 Gastro-esophageal reflux disease without esophagitis: Secondary | ICD-10-CM | POA: Diagnosis present

## 2020-12-17 DIAGNOSIS — R069 Unspecified abnormalities of breathing: Secondary | ICD-10-CM | POA: Diagnosis not present

## 2020-12-17 DIAGNOSIS — E785 Hyperlipidemia, unspecified: Secondary | ICD-10-CM | POA: Diagnosis present

## 2020-12-17 DIAGNOSIS — I10 Essential (primary) hypertension: Secondary | ICD-10-CM | POA: Diagnosis present

## 2020-12-17 DIAGNOSIS — Z9119 Patient's noncompliance with other medical treatment and regimen: Secondary | ICD-10-CM

## 2020-12-17 DIAGNOSIS — Z7982 Long term (current) use of aspirin: Secondary | ICD-10-CM

## 2020-12-17 DIAGNOSIS — I480 Paroxysmal atrial fibrillation: Secondary | ICD-10-CM | POA: Diagnosis present

## 2020-12-17 DIAGNOSIS — Z825 Family history of asthma and other chronic lower respiratory diseases: Secondary | ICD-10-CM

## 2020-12-17 LAB — CBC
HCT: 29.8 % — ABNORMAL LOW (ref 39.0–52.0)
Hemoglobin: 9.6 g/dL — ABNORMAL LOW (ref 13.0–17.0)
MCH: 32 pg (ref 26.0–34.0)
MCHC: 32.2 g/dL (ref 30.0–36.0)
MCV: 99.3 fL (ref 80.0–100.0)
Platelets: 254 10*3/uL (ref 150–400)
RBC: 3 MIL/uL — ABNORMAL LOW (ref 4.22–5.81)
RDW: 18 % — ABNORMAL HIGH (ref 11.5–15.5)
WBC: 9.2 10*3/uL (ref 4.0–10.5)
nRBC: 0 % (ref 0.0–0.2)

## 2020-12-17 LAB — RESP PANEL BY RT-PCR (FLU A&B, COVID) ARPGX2
Influenza A by PCR: NEGATIVE
Influenza B by PCR: NEGATIVE
SARS Coronavirus 2 by RT PCR: NEGATIVE

## 2020-12-17 LAB — BASIC METABOLIC PANEL
Anion gap: 14 (ref 5–15)
BUN: 52 mg/dL — ABNORMAL HIGH (ref 8–23)
CO2: 29 mmol/L (ref 22–32)
Calcium: 9.2 mg/dL (ref 8.9–10.3)
Chloride: 97 mmol/L — ABNORMAL LOW (ref 98–111)
Creatinine, Ser: 11.23 mg/dL — ABNORMAL HIGH (ref 0.61–1.24)
GFR, Estimated: 5 mL/min — ABNORMAL LOW (ref >60)
Glucose, Bld: 89 mg/dL (ref 70–99)
Potassium: 4.6 mmol/L (ref 3.5–5.1)
Sodium: 140 mmol/L (ref 135–145)

## 2020-12-17 LAB — TROPONIN I (HIGH SENSITIVITY)
Troponin I (High Sensitivity): 185 ng/L (ref <18)
Troponin I (High Sensitivity): 267 ng/L (ref <18)

## 2020-12-17 LAB — BRAIN NATRIURETIC PEPTIDE: B Natriuretic Peptide: 2695 pg/mL — ABNORMAL HIGH (ref 0.0–100.0)

## 2020-12-17 MED ORDER — ONDANSETRON HCL 4 MG PO TABS: 4.0000 mg | ORAL_TABLET | Freq: Four times a day (QID) | ORAL | Status: DC | PRN

## 2020-12-17 MED ORDER — PANTOPRAZOLE SODIUM 40 MG PO TBEC: 40.0000 mg | DELAYED_RELEASE_TABLET | Freq: Every day | ORAL | Status: DC

## 2020-12-17 MED ORDER — FAMOTIDINE 20 MG PO TABS: 20.0000 mg | ORAL_TABLET | Freq: Two times a day (BID) | ORAL | 0 refills | Status: AC | PRN

## 2020-12-17 MED ORDER — ASPIRIN 81 MG PO CHEW
324.0000 mg | CHEWABLE_TABLET | Freq: Once | ORAL | Status: AC
  Administered 2020-12-17: 324 mg via ORAL
  Filled 2020-12-17: qty 4

## 2020-12-17 MED ORDER — FAMOTIDINE 20 MG PO TABS: 20.0000 mg | ORAL_TABLET | Freq: Two times a day (BID) | ORAL | Status: DC | PRN

## 2020-12-17 MED ORDER — ONDANSETRON HCL 4 MG/2ML IJ SOLN: 4.0000 mg | Freq: Four times a day (QID) | INTRAMUSCULAR | Status: DC | PRN

## 2020-12-17 MED ORDER — ISOSORBIDE MONONITRATE ER 30 MG PO TB24: 90.0000 mg | ORAL_TABLET | Freq: Every day | ORAL | Status: DC

## 2020-12-17 MED ORDER — NITROGLYCERIN 0.4 MG SL SUBL: 0.4000 mg | SUBLINGUAL_TABLET | SUBLINGUAL | Status: DC | PRN

## 2020-12-17 MED ORDER — ATORVASTATIN CALCIUM 80 MG PO TABS
80.0000 mg | ORAL_TABLET | Freq: Every day | ORAL | Status: DC
  Administered 2020-12-17 – 2020-12-18 (×2): 80 mg via ORAL
  Filled 2020-12-17: qty 1
  Filled 2020-12-17: qty 2

## 2020-12-17 MED ORDER — ACETAMINOPHEN 325 MG PO TABS: 650.0000 mg | ORAL_TABLET | Freq: Four times a day (QID) | ORAL | Status: DC | PRN

## 2020-12-17 MED ORDER — HEPARIN SODIUM (PORCINE) 5000 UNIT/ML IJ SOLN
5000.0000 [IU] | Freq: Three times a day (TID) | INTRAMUSCULAR | Status: DC
  Administered 2020-12-17 – 2020-12-19 (×5): 5000 [IU] via SUBCUTANEOUS
  Filled 2020-12-17 (×5): qty 1

## 2020-12-17 MED ORDER — CLOPIDOGREL BISULFATE 75 MG PO TABS
75.0000 mg | ORAL_TABLET | Freq: Every evening | ORAL | Status: DC
  Administered 2020-12-17 – 2020-12-18 (×2): 75 mg via ORAL
  Filled 2020-12-17 (×2): qty 1

## 2020-12-17 MED ORDER — CHLORHEXIDINE GLUCONATE CLOTH 2 % EX PADS: 6.0000 | MEDICATED_PAD | Freq: Every day | CUTANEOUS | Status: DC

## 2020-12-17 MED ORDER — ACETAMINOPHEN 650 MG RE SUPP: 650.0000 mg | Freq: Four times a day (QID) | RECTAL | Status: DC | PRN

## 2020-12-17 MED ORDER — AMLODIPINE BESYLATE 5 MG PO TABS
5.0000 mg | ORAL_TABLET | Freq: Every day | ORAL | Status: DC
  Administered 2020-12-19: 5 mg via ORAL
  Filled 2020-12-17 (×2): qty 1

## 2020-12-17 MED ORDER — LANTHANUM CARBONATE 500 MG PO CHEW
1000.0000 mg | CHEWABLE_TABLET | Freq: Three times a day (TID) | ORAL | Status: DC
  Filled 2020-12-17 (×3): qty 2

## 2020-12-17 NOTE — ED Notes (Signed)
Trop not sent d/t hemolyzation. Will see if IV team can help. Lab unable to obtain sample as well

## 2020-12-17 NOTE — Consult Note (Signed)
[facility] Renal Consultation Note    Indication for Consultation:  Management of ESRD/hemodialysis; anemia, hypertension/volume and secondary hyperparathyroidism  HPI: Kenneth [name] is a 65 y.o. male with PMH including ESRD on dialysis Tuesday, Saturday, CAD, CHF, medical noncompliance, and HTN who presented to [facility] ED via EMS with a 2 day history of shortness of breath.  He also reported orthopnea and DOE.  In the ED he was hypoxic and required 6 liters O2 via [place] but has since improved.  He had a similar presentation on 11/19/20 and again on 11/27/20.  He states that he went to HD on Saturday however he only ran 2.5 hrs (out of scheduled 4) and left 4kg above his edw.  No recent fevers/chills and covid test is pending.  CXR consistent with CHF exacerbation. Nephrology was consulted to help provide HD and UF during his hospitalization.  He does admit that he does not follow any fluid restrictions at home.  He also started to complain of chest pain in the ED as well as abdominal pain and N/V for the past 2 days.  Past Medical History:  Diagnosis Date  . Acute CHF ([place]) 01/2018  . Cardiomyopathy secondary    likely related to HTN heart disease; possibly ETOH related as well  . Chronic combined systolic and diastolic heart failure (HCC)    Echocardiogram 09/22/11: Moderate LVH, EF 99991111, grade 3 diastolic dysfunction, mild MR, moderate to severe LAE, mild RVE, mild to moderate TR, small to moderate pericardial effusion  . Coronary artery disease    not felt to be candidate for CABG 08/2018; medical thearpy recomended due to high risk of PCI   . Dysrhythmia    aflutter 04/2016, afib 02/2018, not felt to be a candidate for anticoagulation due to non-compliance and ETOH  . ESRD (end stage renal disease) on dialysis ([place])    due to hypertensive nephrosclerosis; TTS; [address] (09/01/2018)  . History of alcohol abuse   . Hypertension   . Myocardial infarction [facility])    " mild " per  daughter  . PEA (Pulseless electrical activity) ([place])    PEA arrest 03/09/18, treated empirically for hyperkalemia, shock x1 for WCT, given amiodarone, ROSC after 10 min of ACLS   Past Surgical History:  Procedure Laterality Date  . AV FISTULA PLACEMENT Left 05/14/2016   Procedure: LEFT ARM BASILIC VEIN TRANSPOSITION;  Surgeon: Rosetta Posner, MD;  Location: [place];  Service: Vascular;  Laterality: Left;  . INSERTION OF DIALYSIS CATHETER N/A 06/20/2019   Procedure: INSERTION OF TUNNELED  DIALYSIS CATHETER;  Surgeon: Elam Dutch, MD;  Location: [place];  Service: Vascular;  Laterality: N/A;  . LEFT HEART CATH AND CORONARY ANGIOGRAPHY N/A 09/02/2018   Procedure: LEFT HEART CATH AND CORONARY ANGIOGRAPHY;  Surgeon: Nelva [place], MD;  Location: [place] CV LAB;  Service: Cardiovascular;  Laterality: N/A;  . LEFT HEART CATH AND CORONARY ANGIOGRAPHY N/A 05/14/2020   Procedure: LEFT HEART CATH AND CORONARY ANGIOGRAPHY;  Surgeon: Sherren Mocha, MD;  Location: [place] CV LAB;  Service: Cardiovascular;  Laterality: N/A;  . PERIPHERAL VASCULAR CATHETERIZATION N/A 05/13/2016   Procedure: Dialysis/Perma Catheter Insertion;  Surgeon: Serafina Mitchell, MD;  Location: [place] CV LAB;  Service: Cardiovascular;  Laterality: N/A;  . REVISION OF ARTERIOVENOUS GORETEX GRAFT Left 0000000   Procedure: PLICATION OF ARTERIOVENOUS FISTULA LEFT ARM;  Surgeon: Elam Dutch, MD;  Location: [place];  Service: Vascular;  Laterality: Left;   Family History:  Family History  Problem Relation Age of Onset  . Emphysema Mother   . Cirrhosis Father    Social History:  reports that he quit smoking about 3 years ago. His smoking use included cigars. He has a 96.00 pack-year smoking history. He has quit using smokeless tobacco.  His smokeless tobacco use included chew. He reports previous alcohol use. He reports that he does not use drugs. No Known Allergies Prior to Admission medications   Medication Sig Start  Date End Date Taking? Authorizing Provider  amLODipine (NORVASC) 10 MG tablet Take 0.5 tablets (5 mg total) by mouth daily. 10/20/20   Kathie Dike, MD  aspirin EC 81 MG EC tablet Take 1 tablet (81 mg total) by mouth daily. Swallow whole. 05/16/20   Alma [place], MD  atorvastatin (LIPITOR) 80 MG tablet Take 1 tablet (80 mg total) by mouth daily. Patient taking differently: Take 80 mg by mouth at bedtime. 10/09/20   Erlene Quan, PA-C  B Complex-C-Folic Acid (RENA-VITE RX) 1 MG TABS Take 1 tablet by mouth daily. 10/05/20   [provider]  clopidogrel (PLAVIX) 75 MG tablet Take 1 tablet (75 mg total) by mouth daily. Patient taking differently: Take 75 mg by mouth every evening. 10/09/20   Kilroy, Doreene [place], PA-C  diphenhydramine-acetaminophen (TYLENOL PM EXTRA STRENGTH) 25-500 MG TABS tablet Take 2 tablets by mouth at bedtime as needed (pain). 05/12/18   [provider]  famotidine (PEPCID) 20 MG tablet Take 1 tablet (20 mg total) by mouth 2 (two) times daily as needed for heartburn. 12/17/20   [name], MD  isosorbide mononitrate (IMDUR) 30 MG 24 hr tablet Take 3 tablets (90 mg total) by mouth daily. 10/29/20 11/28/20  Vashti Hey, MD  lanthanum (FOSRENOL) 1000 MG chewable tablet Chew 1,000 mg by mouth 3 (three) times daily. 08/23/20   [provider]  metoprolol succinate (TOPROL-XL) 25 MG 24 hr tablet Take 25 mg by mouth in the morning and at bedtime.    [provider]  nitroGLYCERIN (NITROSTAT) 0.4 MG SL tablet Place 1 tablet (0.4 mg total) under the tongue every 5 (five) minutes as needed for chest pain (max 3 doses). 10/13/20   Truddie Hidden, MD  pantoprazole (PROTONIX) 40 MG tablet Take 1 tablet (40 mg total) by mouth daily before breakfast. 12/12/20   [name], MD  VITAMIN D PO Take 1 tablet by mouth daily. 11/15/20 11/14/21  [provider]   Current Facility-Administered Medications  Medication Dose Route  Frequency Provider Last Rate Last Admin  . [START ON 12/18/2020] Chlorhexidine Gluconate Cloth 2 % PADS 6 each  6 each Topical Q0600 Donato Heinz, MD       Current Outpatient Medications  Medication Sig Dispense Refill  . amLODipine (NORVASC) 10 MG tablet Take 0.5 tablets (5 mg total) by mouth daily. 90 tablet 3  . aspirin EC 81 MG EC tablet Take 1 tablet (81 mg total) by mouth daily. Swallow whole. 30 tablet 11  . atorvastatin (LIPITOR) 80 MG tablet Take 1 tablet (80 mg total) by mouth daily. (Patient taking differently: Take 80 mg by mouth at bedtime.) 90 tablet 3  . B Complex-C-Folic Acid (RENA-VITE RX) 1 MG TABS Take 1 tablet by mouth daily.    . clopidogrel (PLAVIX) 75 MG tablet Take 1 tablet (75 mg total) by mouth daily. (Patient taking differently: Take 75 mg by mouth every evening.) 90 tablet 3  . diphenhydramine-acetaminophen (TYLENOL PM EXTRA STRENGTH) 25-500 MG  TABS tablet Take 2 tablets by mouth at bedtime as needed (pain).    . famotidine (PEPCID) 20 MG tablet Take 1 tablet (20 mg total) by mouth 2 (two) times daily as needed for heartburn. 60 tablet 0  . isosorbide mononitrate (IMDUR) 30 MG 24 hr tablet Take 3 tablets (90 mg total) by mouth daily. 90 tablet 0  . lanthanum (FOSRENOL) 1000 MG chewable tablet Chew 1,000 mg by mouth 3 (three) times daily.    . metoprolol succinate (TOPROL-XL) 25 MG 24 hr tablet Take 25 mg by mouth in the morning and at bedtime.    . nitroGLYCERIN (NITROSTAT) 0.4 MG SL tablet Place 1 tablet (0.4 mg total) under the tongue every 5 (five) minutes as needed for chest pain (max 3 doses). 30 tablet 1  . pantoprazole (PROTONIX) 40 MG tablet Take 1 tablet (40 mg total) by mouth daily before breakfast. 90 tablet 0  . VITAMIN D PO Take 1 tablet by mouth daily.     Labs: Basic Metabolic Panel: Recent Labs  Lab 12/17/20 1743  NA 140  K 4.6  CL 97*  CO2 29  GLUCOSE 89  BUN 52*  CREATININE 11.23*  CALCIUM 9.2   Liver Function Tests: No results for  input(s): AST, ALT, ALKPHOS, BILITOT, PROT, ALBUMIN in the last 168 hours. No results for input(s): LIPASE, AMYLASE in the last 168 hours. No results for input(s): AMMONIA in the last 168 hours. CBC: Recent Labs  Lab 12/17/20 1743  WBC 9.2  HGB 9.6*  HCT 29.8*  MCV 99.3  PLT 254   Cardiac Enzymes: No results for input(s): CKTOTAL, CKMB, CKMBINDEX, TROPONINI in the last 168 hours. CBG: No results for input(s): GLUCAP in the last 168 hours. Iron Studies: No results for input(s): IRON, TIBC, TRANSFERRIN, FERRITIN in the last 72 hours. Studies/Results: DG Chest 2 View  Result Date: 12/17/2020 CLINICAL DATA:  Shortness of breath EXAM: CHEST - 2 VIEW COMPARISON:  11/27/2020 FINDINGS: Cardiomegaly. Mild, diffuse bilateral interstitial pulmonary opacity. The visualized skeletal structures are unremarkable. IMPRESSION: Cardiomegaly with mild, diffuse bilateral interstitial pulmonary opacity, most consistent with mild edema. No focal airspace opacity. Electronically Signed   By: Eddie Candle M.D.   On: 12/17/2020 19:18    ROS: Pertinent items are noted in HPI. Physical Exam: Vitals:   12/17/20 1829 12/17/20 1851 12/17/20 1930 12/17/20 2015  BP: (!) 166/82  (!) 159/96 (!) 160/89  Pulse: 100  99 98  Resp: 15  (!) 31 (!) 25  Temp:      TempSrc:      SpO2: 100% 98% 99% 100%  Weight:      Height:          Weight change:  No intake or output data in the 24 hours ending 12/17/20 2049 BP (!) 160/89   Pulse 98   Temp 98.3 F (36.8 C) (Oral)   Resp (!) 25   Ht '5\' 11"'$  (1.803 m)   Wt 88 kg   SpO2 100%   BMI 27.06 kg/m  General appearance: alert, no distress and sitting upright in bed Head: Normocephalic, without obvious abnormality, atraumatic Resp: rhonchi bilaterally Cardio: regular rate and rhythm, S1, S2 normal, no murmur, click, rub or gallop GI: soft, non-tender; bowel sounds normal; no masses,  no organomegaly Extremities: extremities normal, atraumatic, no cyanosis or edema  and LUE AVF +T/B Dialysis Access:   Dialysis Orders: Center: [facility]. TueThuSat, 4 hrs 0 min, 180NRe Optiflux, BFR 400, DFR Manual 800 mL/min, EDW 82.5 (  kg), Dialysate 2.0 K, 2.0 Ca Heparin 2400 unit bolus sensipar 30 mg q HD mircera 75 mcg q 2 weeks- last dose 1/13 Calcitriol 2.5 mcg PO q HD  Assessment/Plan: 1.  Volume overload: due to nonadherence with outpatient dialysis (shortens treatments on a regular basis).  CXR consistent with volume overload. Pt has not been meeting his EDW or following fluid restrictions. Plan for HD tonight/early morning depending on dialysis schedule.  UF goal 3-4 L today as tolerated. We discussed the importance of fluid restrictions especially in light of his CAD 2.  ESRD:  Dialyzes TTS, volume overload as above. HD tonight/early am, continue TTS schedule.  3.  Hypertension/volume: Resume home BP meds. UF with HD as tolerated.  4.  Anemia: Follow H/H and cont with ESA.  5.  Metabolic bone disease: Resume VDRA, sensipar and binders, follow phos/Ca.  6.  Nutrition:  Discussed renal diet and fluid restrictions.  7. Severe multivessel CAD: Recurrent hospitalizations for the same. Cardiology recommended medical management, not a candidate for CABG or PCI. No chest pain at present.    Donetta Potts, MD [place] Pager [phone] 12/17/2020, 8:49 PM

## 2020-12-17 NOTE — Telephone Encounter (Signed)
Refill sent to pharmacy. Spoke with daughter, Shafin, Edgar, she is aware refill has been sent to pharmacy

## 2020-12-17 NOTE — ED Notes (Signed)
Pt came back from xray w/ labored and tachypneic breathing. Tried to obtain RA sat on pt when he was back in room, but pt would not hold still despite multiple attempts at education and redirection. Placed pt on 6L [place] and pt breathing improved. Pt O2 sat 100%. Decreased O2 to 1L [place] and pt O2 is 98% on 1L.

## 2020-12-17 NOTE — ED Notes (Signed)
Pt now c/o CP and now N/V, abd pain since yesterday after pt was asked if he had the following s/s.

## 2020-12-17 NOTE — Consult Note (Signed)
[facility] Consult Note   Primary Physician: [name], MD Primary Cardiologist:  [name], Peter, MD   Reason for Consultation: Shortness of breath  HPI:    Kenneth [name] is a 65 year old gentleman with a past medical history of ESRD (HD on Tu-Th-Sat), multi-vessel CAD, combined systolic & diastolic heart failure, paroxysmal atrial fibrillation (not on anticoagulation), medication noncompliance, previous PEA arrest in 2019 (tx with shock x1 + Amio), heavy ETOH use, and hyperlipidemia, who presented to the hospital with complaints of worsening shortness of breath at rest.  He also admits to orthopnea but does not have a lot of leg swelling.  He has had multiple presentations to the hospital for similar complaints (recently on 10/29/20, 11/19/20 and 11/27/20).  He primarily complains of shortness of breath, but he also has had intermittent chest pain and abdominal pain.  He is also feeling nauseous.  In the ED he was noted to be tachypneic.  He felt better once he was placed on supplemental oxygen.  The initial vitals were blood pressure 160/86, heart rate 93 bpm and respiratory rate 25.  BNP was 2695.  High-sensitivity troponins were 185 and 267.  Chest x-ray showed mild interstitial edema.  Electrocardiogram from 12/17/20 (at 1716), that I reviewed personally, showed normal sinus rhythm, rate 92 bpm, voltage criteria for LVH, and repolarization abnormalities.  Cardiac catheterization in July 2021 showed multivessel CAD (severe ostial/proximal LAD stenosis, severe proximal circumflex stenosis and chronic occlusion of the RCA with left to right collaterals).  He was felt not to be a candidate for high risk PCI or CABG due to his history of noncompliance. Echo July 2021 with LVEF=55-60%. Moderate mitral valve regurgitation.     Home Medications Prior to Admission medications   Medication Sig Start Date End Date Taking? Authorizing Provider  amLODipine (NORVASC) 10 MG tablet Take 0.5  tablets (5 mg total) by mouth daily. 10/20/20  Yes Kathie Dike, MD  atorvastatin (LIPITOR) 80 MG tablet Take 1 tablet (80 mg total) by mouth daily. Patient taking differently: Take 80 mg by mouth at bedtime. 10/09/20  Yes Kilroy, Luke K, PA-C  B Complex-C-Folic Acid (RENA-VITE RX) 1 MG TABS Take 1 tablet by mouth daily. 10/05/20  Yes [provider]  clopidogrel (PLAVIX) 75 MG tablet Take 1 tablet (75 mg total) by mouth daily. Patient taking differently: Take 75 mg by mouth every evening. 10/09/20  Yes Kilroy, Luke K, PA-C  diphenhydramine-acetaminophen (TYLENOL PM EXTRA STRENGTH) 25-500 MG TABS tablet Take 2 tablets by mouth at bedtime as needed (pain). 05/12/18  Yes [provider]  famotidine (PEPCID) 20 MG tablet Take 1 tablet (20 mg total) by mouth 2 (two) times daily as needed for heartburn. 12/17/20  Yes [name], MD  isosorbide mononitrate (IMDUR) 30 MG 24 hr tablet Take 3 tablets (90 mg total) by mouth daily. 10/29/20 11/28/20 Yes Vashti Hey, MD  lanthanum (FOSRENOL) 1000 MG chewable tablet Chew 1,000 mg by mouth 3 (three) times daily. 08/23/20  Yes [provider]  metoprolol succinate (TOPROL-XL) 25 MG 24 hr tablet Take 25 mg by mouth in the morning and at bedtime.   Yes [provider]  nitroGLYCERIN (NITROSTAT) 0.4 MG SL tablet Place 1 tablet (0.4 mg total) under the tongue every 5 (five) minutes as needed for chest pain (max 3 doses). 10/13/20  Yes Truddie Hidden, MD  pantoprazole (PROTONIX) 40 MG tablet Take 1 tablet (40 mg total) by mouth daily before  breakfast. 12/12/20  Yes [name], MD  aspirin EC 81 MG EC tablet Take 1 tablet (81 mg total) by mouth daily. Swallow whole. Patient not taking: Reported on 12/17/2020 05/16/20   Alma Friendly, MD    Past Medical History: Past Medical History:  Diagnosis Date  . Acute CHF ([place]) 01/2018  . Cardiomyopathy secondary    likely related to HTN heart disease;  possibly ETOH related as well  . Chronic combined systolic and diastolic heart failure (HCC)    Echocardiogram 09/22/11: Moderate LVH, EF 99991111, grade 3 diastolic dysfunction, mild MR, moderate to severe LAE, mild RVE, mild to moderate TR, small to moderate pericardial effusion  . Coronary artery disease    not felt to be candidate for CABG 08/2018; medical thearpy recomended due to high risk of PCI   . Dysrhythmia    aflutter 04/2016, afib 02/2018, not felt to be a candidate for anticoagulation due to non-compliance and ETOH  . ESRD (end stage renal disease) on dialysis ([place])    due to hypertensive nephrosclerosis; TTS; [address] (09/01/2018)  . History of alcohol abuse   . Hypertension   . Myocardial infarction [facility])    " mild " per daughter  . PEA (Pulseless electrical activity) ([place])    PEA arrest 03/09/18, treated empirically for hyperkalemia, shock x1 for WCT, given amiodarone, ROSC after 10 min of ACLS    Past Surgical History: Past Surgical History:  Procedure Laterality Date  . AV FISTULA PLACEMENT Left 05/14/2016   Procedure: LEFT ARM BASILIC VEIN TRANSPOSITION;  Surgeon: Rosetta Posner, MD;  Location: [place];  Service: Vascular;  Laterality: Left;  . INSERTION OF DIALYSIS CATHETER N/A 06/20/2019   Procedure: INSERTION OF TUNNELED  DIALYSIS CATHETER;  Surgeon: Elam Dutch, MD;  Location: [place];  Service: Vascular;  Laterality: N/A;  . LEFT HEART CATH AND CORONARY ANGIOGRAPHY N/A 09/02/2018   Procedure: LEFT HEART CATH AND CORONARY ANGIOGRAPHY;  Surgeon: Nelva Bush, MD;  Location: [place] [facility];  Service: Cardiovascular;  Laterality: N/A;  . LEFT HEART CATH AND CORONARY ANGIOGRAPHY N/A 05/14/2020   Procedure: LEFT HEART CATH AND CORONARY ANGIOGRAPHY;  Surgeon: Sherren Mocha, MD;  Location: [place] [facility];  Service: Cardiovascular;  Laterality: N/A;  . PERIPHERAL VASCULAR CATHETERIZATION N/A 05/13/2016   Procedure: Dialysis/Perma Catheter Insertion;  Surgeon: Serafina Mitchell, MD;  Location: [place] [facility];  Service: Cardiovascular;  Laterality: N/A;  . REVISION OF ARTERIOVENOUS GORETEX GRAFT Left 0000000   Procedure: PLICATION OF ARTERIOVENOUS FISTULA LEFT ARM;  Surgeon: Elam Dutch, MD;  Location: [facility];  Service: Vascular;  Laterality: Left;    Family History: Family History  Problem Relation Age of Onset  . Emphysema Mother   . Cirrhosis Father     Social History: Social History   Socioeconomic History  . Marital status: Divorced    Spouse name: Not on file  . Number of children: Not on file  . Years of education: Not on file  . Highest education level: Not on file  Occupational History  . Occupation: retired  Tobacco Use  . Smoking status: Former Smoker    Packs/day: 2.00    Years: 48.00    Pack years: 96.00    Types: Cigars    Quit date: 10/27/2017    Years since quitting: 3.1  . Smokeless tobacco: Former Systems developer    Types: Secondary school teacher  . Vaping Use: Never used  Substance and Sexual Activity  .  Alcohol use: Not Currently    Comment: h/o very heavy use, quit about 2019  . Drug use: No  . Sexual activity: Not Currently  Other Topics Concern  . Not on file  Social History Narrative  . Not on file   Social Determinants of Health   Financial Resource Strain: Not on file  Food Insecurity: Not on file  Transportation Needs: Not on file  Physical Activity: Not on file  Stress: Not on file  Social Connections: Not on file    Allergies:  No Known Allergies   Review of Systems: [y] = yes, '[ ]'$  = no   . General: Weight gain '[ ]'$ ; Weight loss '[ ]'$ ; Anorexia '[ ]'$ ; Fatigue '[ ]'$ ; Fever '[ ]'$ ; Chills '[ ]'$ ; Weakness '[ ]'$   . Cardiac: Chest pain/pressure [Y]; Resting SOB [Y]; Exertional SOB [Y]; Orthopnea '[ ]'$ ; Pedal Edema '[ ]'$ ; Palpitations '[ ]'$ ; Syncope '[ ]'$ ; Presyncope '[ ]'$ ; Paroxysmal nocturnal dyspnea'[ ]'$   . Pulmonary: Cough '[ ]'$ ; Wheezing'[ ]'$ ; Hemoptysis'[ ]'$ ; Sputum '[ ]'$ ; Snoring '[ ]'$   . GI: Vomiting'[ ]'$ ; Dysphagia'[ ]'$ ; Melena'[ ]'$ ;  Hematochezia '[ ]'$ ; Heartburn'[ ]'$ ; Abdominal pain '[ ]'$ ; Constipation '[ ]'$ ; Diarrhea '[ ]'$ ; BRBPR '[ ]'$   . GU: Hematuria'[ ]'$ ; Dysuria '[ ]'$ ; Nocturia'[ ]'$   . Vascular: Pain in legs with walking '[ ]'$ ; Pain in feet with lying flat '[ ]'$ ; Non-healing sores '[ ]'$ ; Stroke '[ ]'$ ; TIA '[ ]'$ ; Slurred speech '[ ]'$ ;  . Neuro: Headaches'[ ]'$ ; Vertigo'[ ]'$ ; Seizures'[ ]'$ ; Paresthesias'[ ]'$ ;Blurred vision '[ ]'$ ; Diplopia '[ ]'$ ; Vision changes '[ ]'$   . Ortho/Skin: Arthritis '[ ]'$ ; Joint pain '[ ]'$ ; Muscle pain '[ ]'$ ; Joint swelling '[ ]'$ ; Back Pain '[ ]'$ ; Rash '[ ]'$   . Psych: Depression'[ ]'$ ; Anxiety'[ ]'$   . Heme: Bleeding problems '[ ]'$ ; Clotting disorders '[ ]'$ ; Anemia '[ ]'$   . Endocrine: Diabetes '[ ]'$ ; Thyroid dysfunction'[ ]'$      Objective:    Vital Signs:   Temp:  [98.3 F (36.8 C)] 98.3 F (36.8 C) (02/21 1722) Pulse Rate:  [93-100] 98 (02/21 2015) Resp:  [15-31] 25 (02/21 2015) BP: (159-166)/(82-96) 160/89 (02/21 2015) SpO2:  [98 %-100 %] 100 % (02/21 2015) Weight:  [88 kg] 88 kg (02/21 1828)    Weight change: Filed Weights   12/17/20 1828  Weight: 88 kg    Intake/Output:  No intake or output data in the 24 hours ending 12/17/20 2130    Physical Exam    General:  Tachypneic HEENT: normal Neck: supple. Elevated JVP . Carotids 2+ bilat; no bruits. No lymphadenopathy or thyromegaly appreciated. Cor: PMI nondisplaced. Regular rate & rhythm. Lungs: Bilateral rhonchi Abdomen: soft, nontender, nondistended. No hepatosplenomegaly. No bruits or masses. Good bowel sounds. Extremities: no cyanosis, clubbing, rash, edema Neuro: alert & orientedx3, cranial nerves grossly intact. moves all 4 extremities w/o difficulty.    EKG    Electrocardiogram from 12/17/20 (at 1716), that I reviewed personally, showed normal sinus rhythm, rate 92 bpm, voltage criteria for LVH, and repolarization abnormalities.   Labs   Basic Metabolic Panel: Recent Labs  Lab 12/17/20 1743  NA 140  K 4.6  CL 97*  CO2 29  GLUCOSE 89  BUN 52*  CREATININE 11.23*  CALCIUM  9.2    Liver Function Tests: No results for input(s): AST, ALT, ALKPHOS, BILITOT, PROT, ALBUMIN in the last 168 hours. No results for input(s): LIPASE, AMYLASE in the last 168 hours. No results for input(s): AMMONIA in the last 168  hours.  CBC: Recent Labs  Lab 12/17/20 1743  WBC 9.2  HGB 9.6*  HCT 29.8*  MCV 99.3  PLT 254    Cardiac Enzymes: No results for input(s): CKTOTAL, CKMB, CKMBINDEX, TROPONINI in the last 168 hours.  BNP: BNP (last 3 results) Recent Labs    11/27/20 0937 12/17/20 1828  BNP 1,733.7* 2,695.0*    ProBNP (last 3 results) No results for input(s): PROBNP in the last 8760 hours.   CBG: No results for input(s): GLUCAP in the last 168 hours.  Coagulation Studies: No results for input(s): LABPROT, INR in the last 72 hours.   Imaging   DG Chest 2 View  Result Date: 12/17/2020 CLINICAL DATA:  Shortness of breath EXAM: CHEST - 2 VIEW COMPARISON:  11/27/2020 FINDINGS: Cardiomegaly. Mild, diffuse bilateral interstitial pulmonary opacity. The visualized skeletal structures are unremarkable. IMPRESSION: Cardiomegaly with mild, diffuse bilateral interstitial pulmonary opacity, most consistent with mild edema. No focal airspace opacity. Electronically Signed   By: Eddie Candle M.D.   On: 12/17/2020 19:18      Medications:     Current Medications: . [START ON 12/18/2020] Chlorhexidine Gluconate Cloth  6 each Topical Q0600     Infusions:     Assessment/Plan   1.  Multivessel coronary artery disease The patient presents to the hospital with progressively worsening shortness of breath. It is suspected that he is volume overloaded due to nonadherence with his outpatient dialysis schedule.  Chest x-ray shows interstitial edema.  -Continue to trend cardiac enzymes -Serial ECGs -Continue aspirin and Plavix -Continue high intensity statins -Continue antianginal therapy (with Norvasc, isosorbide mononitrate and metoprolol) -Volume management by HD  per nephrology recommendations  Patient is not a candidate for invasive options given his social issues (related to noncompliance with medications).     Meade Maw, MD  12/17/2020, 9:30 PM  Cardiology Overnight Team Please contact [facility] for night-coverage after hours (4p -7a ) and weekends on [email or web address]

## 2020-12-17 NOTE — ED Notes (Signed)
Pt transported to xray at this time

## 2020-12-17 NOTE — ED Notes (Signed)
Went in to check on pt and BP cuff was lying in the floor inflated. Pt difficult to get to follow instructions to hold arm still to obtain BP measurement

## 2020-12-17 NOTE — ED Notes (Signed)
IV team obtained trop. Sent trop to lab at this time

## 2020-12-17 NOTE — ED Notes (Signed)
Pt is a T, TH, Sa dialysis pt.

## 2020-12-17 NOTE — ED Triage Notes (Signed)
Pt arrives from home via [facility] with chief complaint shortness of breath on 10L NRB. Dialysis pt denies any missed treatments. Room air sats of 80% per ems arrives on 10L 100%. Pt is alert and ox4 states he feels much betetr with oxygen on, he has been sob for the last 3 days worse today.

## 2020-12-17 NOTE — ED Notes (Signed)
Trop 185, notified PA and MD

## 2020-12-17 NOTE — ED Provider Notes (Signed)
[facility] EMERGENCY DEPARTMENT Provider Note   CSN: [id number] Arrival date & time: 12/17/20  1703     History Chief Complaint  Patient presents with  . Shortness of Breath    Kenneth [name] is a 65 y.o. male history of ESRD, CHF, CAD, hypertension, cardiac arrest, alcohol abuse.  Patient presents today for chest pain shortness of breath onset 3 days ago symptoms have been constant he reports pain to the left side of his chest a pressure sensation that has been mild-moderate intensity worsens with activity improves with rest.  He reports shortness of breath for that same amount of time, 3 days.  He reports that he has "too much fluid".  He reports that he has been compliant with his dialysis, last dialyzed on Saturday, 2 days ago for a full session.  Patient reports he has been feeling better since he was placed on oxygen by EMS.  Denies fever/chills, fall/injury, headache, cough/hemoptysis, abdominal pain, nausea/vomiting, extremity swelling/color change or any additional concerns  HPI     Past Medical History:  Diagnosis Date  . Acute CHF ([place]) 01/2018  . Cardiomyopathy secondary    likely related to HTN heart disease; possibly ETOH related as well  . Chronic combined systolic and diastolic heart failure (HCC)    Echocardiogram 09/22/11: Moderate LVH, EF 99991111, grade 3 diastolic dysfunction, mild MR, moderate to severe LAE, mild RVE, mild to moderate TR, small to moderate pericardial effusion  . Coronary artery disease    not felt to be candidate for CABG 08/2018; medical thearpy recomended due to high risk of PCI   . Dysrhythmia    aflutter 04/2016, afib 02/2018, not felt to be a candidate for anticoagulation due to non-compliance and ETOH  . ESRD (end stage renal disease) on dialysis ([place])    due to hypertensive nephrosclerosis; TTS; [address] (09/01/2018)  . History of alcohol abuse   . Hypertension   . Myocardial infarction [facility])    " mild " per  daughter  . PEA (Pulseless electrical activity) ([place])    PEA arrest 03/09/18, treated empirically for hyperkalemia, shock x1 for WCT, given amiodarone, ROSC after 10 min of ACLS    Patient Active Problem List   Diagnosis Date Noted  . CHF exacerbation ([place]) 11/28/2020  . Heart failure ([place]) 11/27/2020  . Fluid overload 11/22/2020  . Volume overload 11/19/2020  . Chest pain 10/27/2020  . PAF (paroxysmal atrial fibrillation) ([place]) 10/27/2020  . Troponin level elevated   . Angina at rest [facility]) 09/24/2020  . Accelerated hypertension 09/24/2020  . Dyslipidemia 09/24/2020  . NSTEMI (non-ST elevated myocardial infarction) ([place]) 05/12/2020  . Non-ST elevated myocardial infarction ([place]) 05/12/2020  . DNR (do not resuscitate) discussion   . Palliative care by specialist   . Goals of care, counseling/discussion   . CAD (coronary artery disease)   . STEMI (ST elevation myocardial infarction) ([place]) 09/17/2018  . Unstable angina ([place])   . ACS (acute coronary syndrome) ([place]) 09/01/2018  . ESRD (end stage renal disease) ([place]) 07/20/2018  . Acute hypoxemic respiratory failure ([place]) 05/11/2018  . Cardiac arrest ([place]) 03/09/2018  . Acute encephalopathy   . Acute on chronic respiratory failure ([place]) 02/16/2018  . Tobacco dependence 02/16/2018  . Pulmonary edema 01/26/2018  . Acute respiratory failure with hypoxia ([place]) 01/26/2018  . Atrial fibrillation and flutter ([place])   . Shortness of breath   . Acute on chronic diastolic CHF (congestive heart failure) ([place])   . Hypothermia 10/08/2016  .  ESRD on hemodialysis ([place]) 10/08/2016  . Fall 10/08/2016  . Syncope 10/07/2016  . Near syncope 07/07/2016  . Cardiomyopathy ([place]) 07/07/2016  . Ventricular tachycardia, non-sustained ([place]) 07/06/2016  . Hypoglycemia 07/06/2016  . CKD (chronic kidney disease) stage 5, GFR less than 15 ml/min (HCC)   . Alcoholism ([place]) 05/13/2016  . Hypertensive emergency 12/08/2013  . Cardiomyopathy secondary 10/09/2011   . Essential hypertension 10/09/2011  . Alcohol withdrawal delirium ([place]) 09/25/2011  . Chronic diastolic CHF (congestive heart failure) ([place]) 09/24/2011    Past Surgical History:  Procedure Laterality Date  . AV FISTULA PLACEMENT Left 05/14/2016   Procedure: LEFT ARM BASILIC VEIN TRANSPOSITION;  Surgeon: Rosetta Posner, MD;  Location: [facility];  Service: Vascular;  Laterality: Left;  . INSERTION OF DIALYSIS CATHETER N/A 06/20/2019   Procedure: INSERTION OF TUNNELED  DIALYSIS CATHETER;  Surgeon: Elam Dutch, MD;  Location: [facility];  Service: Vascular;  Laterality: N/A;  . LEFT HEART CATH AND CORONARY ANGIOGRAPHY N/A 09/02/2018   Procedure: LEFT HEART CATH AND CORONARY ANGIOGRAPHY;  Surgeon: Nelva Bush, MD;  Location: [facility] CV LAB;  Service: Cardiovascular;  Laterality: N/A;  . LEFT HEART CATH AND CORONARY ANGIOGRAPHY N/A 05/14/2020   Procedure: LEFT HEART CATH AND CORONARY ANGIOGRAPHY;  Surgeon: Sherren Mocha, MD;  Location: [facility] CV LAB;  Service: Cardiovascular;  Laterality: N/A;  . PERIPHERAL VASCULAR CATHETERIZATION N/A 05/13/2016   Procedure: Dialysis/Perma Catheter Insertion;  Surgeon: Serafina Mitchell, MD;  Location: [place] [facility] CV LAB;  Service: Cardiovascular;  Laterality: N/A;  . REVISION OF ARTERIOVENOUS GORETEX GRAFT Left [id number]   Procedure: PLICATION OF ARTERIOVENOUS FISTULA LEFT ARM;  Surgeon: Elam Dutch, MD;  Location: [facility] OR;  Service: Vascular;  Laterality: Left;       Family History  Problem Relation Age of Onset  . Emphysema Mother   . Cirrhosis Father     Social History   Tobacco Use  . Smoking status: Former Smoker    Packs/day: 2.00    Years: 48.00    Pack years: 96.00    Types: Cigars    Quit date: 10/27/2017    Years since quitting: 3.1  . Smokeless tobacco: Former Systems developer    Types: Secondary school teacher  . Vaping Use: Never used  Substance Use Topics  . Alcohol use: Not Currently    Comment: h/o very heavy use, quit about 2019  .  Drug use: No    Home Medications Prior to Admission medications   Medication Sig Start Date End Date Taking? Authorizing Provider  amLODipine (NORVASC) 10 MG tablet Take 0.5 tablets (5 mg total) by mouth daily. 10/20/20   Kathie Dike, MD  aspirin EC 81 MG EC tablet Take 1 tablet (81 mg total) by mouth daily. Swallow whole. 05/16/20   Alma Friendly, MD  atorvastatin (LIPITOR) 80 MG tablet Take 1 tablet (80 mg total) by mouth daily. Patient taking differently: Take 80 mg by mouth at bedtime. 10/09/20   Erlene Quan, PA-C  B Complex-C-Folic Acid (RENA-VITE RX) 1 MG TABS Take 1 tablet by mouth daily. 10/05/20   [provider]  clopidogrel (PLAVIX) 75 MG tablet Take 1 tablet (75 mg total) by mouth daily. Patient taking differently: Take 75 mg by mouth every evening. 10/09/20   Kilroy, Doreene Burke, PA-C  diphenhydramine-acetaminophen (TYLENOL PM EXTRA STRENGTH) 25-500 MG TABS tablet Take 2 tablets by mouth at bedtime as needed (pain). 05/12/18   [provider]  famotidine (PEPCID) 20 MG  tablet Take 1 tablet (20 mg total) by mouth 2 (two) times daily as needed for heartburn. 12/17/20   [name], MD  isosorbide mononitrate (IMDUR) 30 MG 24 hr tablet Take 3 tablets (90 mg total) by mouth daily. 10/29/20 11/28/20  Vashti Hey, MD  lanthanum (FOSRENOL) 1000 MG chewable tablet Chew 1,000 mg by mouth 3 (three) times daily. 08/23/20   [provider]  metoprolol succinate (TOPROL-XL) 25 MG 24 hr tablet Take 25 mg by mouth in the morning and at bedtime.    [provider]  nitroGLYCERIN (NITROSTAT) 0.4 MG SL tablet Place 1 tablet (0.4 mg total) under the tongue every 5 (five) minutes as needed for chest pain (max 3 doses). 10/13/20   Truddie Hidden, MD  pantoprazole (PROTONIX) 40 MG tablet Take 1 tablet (40 mg total) by mouth daily before breakfast. 12/12/20   [name], MD  VITAMIN D PO Take 1 tablet by mouth daily. 11/15/20 11/14/21   [provider]    Allergies    Patient has no known allergies.  Review of Systems   Review of Systems Ten systems are reviewed and are negative for acute change except as noted in the HPI  Physical Exam Updated Vital Signs BP (!) 166/82 (BP Location: Right Arm)   Pulse 100   Temp 98.3 F (36.8 C) (Oral)   Resp 15   Ht '5\' 11"'$  (1.803 m)   Wt 88 kg   SpO2 98%   BMI 27.06 kg/m   Physical Exam Constitutional:      General: He is not in acute distress.    Appearance: Normal appearance. He is well-developed. He is not ill-appearing or diaphoretic.  HENT:     Head: Normocephalic and atraumatic.  Eyes:     General: Vision grossly intact. Gaze aligned appropriately.     Pupils: Pupils are equal, round, and reactive to light.  Neck:     Trachea: Trachea and phonation normal.  Cardiovascular:     Rate and Rhythm: Normal rate and regular rhythm.  Pulmonary:     Effort: Pulmonary effort is normal. Tachypnea present. No respiratory distress.     Breath sounds: Decreased breath sounds present.  Abdominal:     General: There is no distension.     Palpations: Abdomen is soft.     Tenderness: There is no abdominal tenderness. There is no guarding or rebound.  Musculoskeletal:        General: Normal range of motion.     Cervical back: Normal range of motion.     Right lower leg: No tenderness. No edema.     Left lower leg: No tenderness. No edema.  Skin:    General: Skin is warm and dry.  Neurological:     Mental Status: He is alert.     GCS: GCS eye subscore is 4. GCS verbal subscore is 5. GCS motor subscore is 6.     Comments: Speech is clear and goal oriented, follows commands Major Cranial nerves without deficit, no facial droop Moves extremities without ataxia, coordination intact  Psychiatric:        Behavior: Behavior normal.     ED Results / Procedures / Treatments   Labs (all labs ordered are listed, but only abnormal results are displayed) Labs Reviewed   BASIC METABOLIC PANEL - Abnormal; Notable for the following components:      Result Value   Chloride 97 (*)    BUN 52 (*)  Creatinine, Ser 11.23 (*)    GFR, Estimated 5 (*)    All other components within normal limits  CBC - Abnormal; Notable for the following components:   RBC 3.00 (*)    Hemoglobin 9.6 (*)    HCT 29.8 (*)    RDW 18.0 (*)    All other components within normal limits  BRAIN NATRIURETIC PEPTIDE - Abnormal; Notable for the following components:   B Natriuretic Peptide 2,695.0 (*)    All other components within normal limits  TROPONIN I (HIGH SENSITIVITY) - Abnormal; Notable for the following components:   Troponin I (High Sensitivity) 185 (*)    All other components within normal limits  RESP PANEL BY RT-PCR (FLU A&B, COVID) [id number]  TROPONIN I (HIGH SENSITIVITY)    EKG None  Radiology DG Chest 2 View  Result Date: 12/17/2020 CLINICAL DATA:  Shortness of breath EXAM: CHEST - 2 VIEW COMPARISON:  11/27/2020 FINDINGS: Cardiomegaly. Mild, diffuse bilateral interstitial pulmonary opacity. The visualized skeletal structures are unremarkable. IMPRESSION: Cardiomegaly with mild, diffuse bilateral interstitial pulmonary opacity, most consistent with mild edema. No focal airspace opacity. Electronically Signed   By: Eddie Candle M.D.   On: 12/17/2020 19:18    Procedures Procedures   Medications Ordered in ED Medications  aspirin chewable tablet 324 mg (has no administration in time range)    ED Course  I have reviewed the triage vital signs and the nursing notes.  Pertinent labs & imaging results that were available during my care of the patient were reviewed by me and considered in my medical decision making (see chart for details).  Clinical Course as of 12/17/20 2236  Mon Dec 17, 2020  2004 Dr. Romie Levee [BM]  2011 Dr. Paticia Stack [BM]  2126 Dr. Olevia Bowens [BM]    Clinical Course User Index [BM] Gari Crown   MDM Rules/Calculators/A&P                          Additional history obtained from: 1. Nursing notes from this visit. 2. Review of electronic medical records.  Patient was last admitted to the hospital November 27, 2020, discharged the next day.  Diagnoses were acute hypoxic respiratory failure secondary to CHF and volume overload, hyperkalemia, elevated troponin, paroxysmal A. fib and ESRD. ----------------- 65 year old male history as above presented for chest pain shortness of breath x3 days, he reports he feels that he has too much fluid on his lungs.  He has no significant extremity swelling.  He reports he was last dialyzed 2 days ago.  No infectious symptoms.  He was on 10 L nonrebreather by EMS he has been deescalated to 2 L nasal cannula upon my initial evaluation in his room maintaining SPO2 of 99-100%.  Basic labs were obtained in triage including a CBC which showed no leukocytosis and showed anemia of 9.6.  BMP showed elevated creatinine and BUN consistent with ESRD, normal potassium.  High-sensitivity elevated at 185 but this is decreased from 2 weeks ago where it was in the 300s.  Patient's chest x-ray was concerning for pulmonary edema.  EKG did not show acute ischemic changes, reviewed by Dr. Ralene Bathe.  I have added on a BNP and Covid test to patient's work-up today.  Will consult nephrology for dialysis and cardiology given elevated troponin level. - BNP elevated 2695 - 8:04 PM: Consult with nephrology Dr.Coladonato, advises he will come by to see patient shortly.  8:11 PM: Consult with cardiologist Dr. Paticia Stack,  advises he will come by to see patient and give recommendations. - Patient reassessed he is sitting up in bed no acute distress vital signs stable.  Patient is agreeable for admission. - 9:26 PM: Consult with Dr. Olevia Bowens, patient was accepted to medicine service  Note: Portions of this report may have been transcribed using voice recognition software. Every effort was made to ensure accuracy; however, inadvertent  computerized transcription errors may still be present. Final Clinical Impression(s) / ED Diagnoses Final diagnoses:  Acute on chronic congestive heart failure, unspecified heart failure type ([place])  ESRD (end stage renal disease) [facility])    Rx / DC Orders ED Discharge Orders    None       Gari Crown 12/17/20 2255    Quintella Reichert, MD 12/18/20 1451

## 2020-12-17 NOTE — ED Notes (Signed)
Pt sitting on end of stretcher stating that he can't breathe. Pt is on 1L of O2 via [place] sating 100%. Explained to pt that he is oxygenating and his O2 is 100%. Pt refusing to sit back in bed.

## 2020-12-17 NOTE — H&P (Signed)
History and Physical    CALUM STLOUIS [name] DOB: [birth year] DOA: 12/17/2020  PCP: [name], MD   Patient coming from: Home.  I have personally briefly reviewed patient's old medical records in [place]  Chief Complaint: Shortness of breath.  HPI: Kenneth [name] is a 65 y.o. male with medical history significant of alcoholism, chronic combined systolic and diastolic CHF, CAD, history of MI, history of cardiac arrest with PEA, atrial flutter not on anticoagulation due to history of EtOH consumption, ESRD on hemodialysis, hypertension who is brought to the emergency department due to progressively worse dyspnea associated with intermittent chest pain, orthopnea for the past few days.  The patient has not been compliant with his HD treatments.  He denies PND, lower extremity edema.  No fever, chills, sore throat rhinorrhea.  Denies abdominal pain, but occasionally feels nauseous, no recent emesis.  He had an episode of loose stools earlier today, but denies further loose stools bowel movements afterwards.  He still urinates and denies dysuria, frequency or hematuria.  No polyuria, polydipsia, polyphagia or blurred vision.  ED Course: Initial vital signs were temperature 98.3 F, pulse 93, respirations 25, BP 160/86 and O2 sat 100% on NRB.  Labwork: CBC showed a white count 9.2, hemoglobin 9.6 g/dL and platelets 254.  Troponin I 185 and then 267 ng/L.  BNP 2695.0 pg/mL.  BUN is 52 and creatinine 11.23 mg/dL.  The rest of the BMP is unremarkable.  Imaging: His chest radiograph shows cardiomegaly with mild, diffuse bilateral interstitial pulmonary opacity, most likely due to mild edema.  There was no focal airspace opacity.  Please see images and full regular report for further detail.  Review of Systems: As per HPI otherwise all other systems reviewed and are negative.  Past Medical History:  Diagnosis Date  . Acute CHF ([place]) 01/2018  . Cardiomyopathy secondary    likely  related to HTN heart disease; possibly ETOH related as well  . Chronic combined systolic and diastolic heart failure (HCC)    Echocardiogram 09/22/11: Moderate LVH, EF 99991111, grade 3 diastolic dysfunction, mild MR, moderate to severe LAE, mild RVE, mild to moderate TR, small to moderate pericardial effusion  . Coronary artery disease    not felt to be candidate for CABG 08/2018; medical thearpy recomended due to high risk of PCI   . Dysrhythmia    aflutter 04/2016, afib 02/2018, not felt to be a candidate for anticoagulation due to non-compliance and ETOH  . ESRD (end stage renal disease) on dialysis ([place])    due to hypertensive nephrosclerosis; TTS; [address] (09/01/2018)  . History of alcohol abuse   . Hypertension   . Myocardial infarction [facility])    " mild " per daughter  . PEA (Pulseless electrical activity) ([place])    PEA arrest 03/09/18, treated empirically for hyperkalemia, shock x1 for WCT, given amiodarone, ROSC after 10 min of ACLS    Past Surgical History:  Procedure Laterality Date  . AV FISTULA PLACEMENT Left 05/14/2016   Procedure: LEFT ARM BASILIC VEIN TRANSPOSITION;  Surgeon: Rosetta Posner, MD;  Location: [facility];  Service: Vascular;  Laterality: Left;  . INSERTION OF DIALYSIS CATHETER N/A 06/20/2019   Procedure: INSERTION OF TUNNELED  DIALYSIS CATHETER;  Surgeon: Elam Dutch, MD;  Location: [facility];  Service: Vascular;  Laterality: N/A;  . LEFT HEART CATH AND CORONARY ANGIOGRAPHY N/A 09/02/2018   Procedure: LEFT HEART CATH AND CORONARY ANGIOGRAPHY;  Surgeon: Nelva Bush, MD;  Location:  [facility] INVASIVE CV LAB;  Service: Cardiovascular;  Laterality: N/A;  . LEFT HEART CATH AND CORONARY ANGIOGRAPHY N/A 05/14/2020   Procedure: LEFT HEART CATH AND CORONARY ANGIOGRAPHY;  Surgeon: Sherren Mocha, MD;  Location: [facility] CV LAB;  Service: Cardiovascular;  Laterality: N/A;  . PERIPHERAL VASCULAR CATHETERIZATION N/A 05/13/2016   Procedure: Dialysis/Perma Catheter Insertion;  Surgeon:  Serafina Mitchell, MD;  Location: [facility] CV LAB;  Service: Cardiovascular;  Laterality: N/A;  . REVISION OF ARTERIOVENOUS GORETEX GRAFT Left 0000000   Procedure: PLICATION OF ARTERIOVENOUS FISTULA LEFT ARM;  Surgeon: Elam Dutch, MD;  Location: [facility];  Service: Vascular;  Laterality: Left;    Social History  reports that he quit smoking about 3 years ago. His smoking use included cigars. He has a 96.00 pack-year smoking history. He has quit using smokeless tobacco.  His smokeless tobacco use included chew. He reports previous alcohol use. He reports that he does not use drugs.  No Known Allergies  Family History  Problem Relation Age of Onset  . Emphysema Mother   . Cirrhosis Father    Prior to Admission medications   Medication Sig Start Date End Date Taking? Authorizing Provider  amLODipine (NORVASC) 10 MG tablet Take 0.5 tablets (5 mg total) by mouth daily. 10/20/20  Yes Kathie Dike, MD  atorvastatin (LIPITOR) 80 MG tablet Take 1 tablet (80 mg total) by mouth daily. Patient taking differently: Take 80 mg by mouth at bedtime. 10/09/20  Yes Kilroy, Luke K, PA-C  B Complex-C-Folic Acid (RENA-VITE RX) 1 MG TABS Take 1 tablet by mouth daily. 10/05/20  Yes [provider]  clopidogrel (PLAVIX) 75 MG tablet Take 1 tablet (75 mg total) by mouth daily. Patient taking differently: Take 75 mg by mouth every evening. 10/09/20  Yes Kilroy, Luke K, PA-C  diphenhydramine-acetaminophen (TYLENOL PM EXTRA STRENGTH) 25-500 MG TABS tablet Take 2 tablets by mouth at bedtime as needed (pain). 05/12/18  Yes [provider]  famotidine (PEPCID) 20 MG tablet Take 1 tablet (20 mg total) by mouth 2 (two) times daily as needed for heartburn. 12/17/20  Yes [name], MD  isosorbide mononitrate (IMDUR) 30 MG 24 hr tablet Take 3 tablets (90 mg total) by mouth daily. 10/29/20 11/28/20 Yes Vashti Hey, MD  lanthanum (FOSRENOL) 1000 MG chewable tablet Chew 1,000 mg by mouth  3 (three) times daily. 08/23/20  Yes [provider]  metoprolol succinate (TOPROL-XL) 25 MG 24 hr tablet Take 25 mg by mouth in the morning and at bedtime.   Yes [provider]  nitroGLYCERIN (NITROSTAT) 0.4 MG SL tablet Place 1 tablet (0.4 mg total) under the tongue every 5 (five) minutes as needed for chest pain (max 3 doses). 10/13/20  Yes Truddie Hidden, MD  pantoprazole (PROTONIX) 40 MG tablet Take 1 tablet (40 mg total) by mouth daily before breakfast. 12/12/20  Yes [name], MD  aspirin EC 81 MG EC tablet Take 1 tablet (81 mg total) by mouth daily. Swallow whole. Patient not taking: Reported on 12/17/2020 05/16/20   Alma Friendly, MD    Physical Exam: Vitals:   12/17/20 1829 12/17/20 1851 12/17/20 1930 12/17/20 2015  BP: (!) 166/82  (!) 159/96 (!) 160/89  Pulse: 100  99 98  Resp: 15  (!) 31 (!) 25  Temp:      TempSrc:      SpO2: 100% 98% 99% 100%  Weight:      Height:  Constitutional: Looks chronically ill.  NAD, calm, comfortable Eyes: PERRL, lids and conjunctivae normal ENMT: Mucous membranes are moist. Posterior pharynx clear of any exudate or lesions. Neck: normal, supple, no masses, no thyromegaly.  Positive JVD Respiratory: Tachypneic in the mid 20s with bivascular crackles. No accessory muscle use.  Cardiovascular: Regular rate and rhythm, no murmurs / rubs / gallops. No extremity edema. 2+ pedal pulses.  Left-sided AV fistula with good thrill.  No carotid bruits.  Abdomen: Obese, no distention.  Bowel sounds positive.  Soft, no tenderness, no masses palpated. No hepatosplenomegaly.  Musculoskeletal: no clubbing / cyanosis.  Good ROM, no contractures. Normal muscle tone.  Skin: no rashes, lesions, ulcers on very limited dermatological examination. Neurologic: CN 2-12 grossly intact. Sensation intact, DTR normal. Strength 5/5 in all 4.  Psychiatric: Normal judgment and insight. Alert and oriented x 3. Normal mood.   Labs on  Admission: I have personally reviewed following labs and imaging studies  CBC: Recent Labs  Lab 12/17/20 1743  WBC 9.2  HGB 9.6*  HCT 29.8*  MCV 99.3  PLT 0000000    Basic Metabolic Panel: Recent Labs  Lab 12/17/20 1743  NA 140  K 4.6  CL 97*  CO2 29  GLUCOSE 89  BUN 52*  CREATININE 11.23*  CALCIUM 9.2    GFR: Estimated Creatinine Clearance: 6.8 mL/min (A) (by C-G formula based on SCr of 11.23 mg/dL (H)).  Liver Function Tests: No results for input(s): AST, ALT, ALKPHOS, BILITOT, PROT, ALBUMIN in the last 168 hours.  Radiological Exams on Admission: DG Chest 2 View  Result Date: 12/17/2020 CLINICAL DATA:  Shortness of breath EXAM: CHEST - 2 VIEW COMPARISON:  11/27/2020 FINDINGS: Cardiomegaly. Mild, diffuse bilateral interstitial pulmonary opacity. The visualized skeletal structures are unremarkable. IMPRESSION: Cardiomegaly with mild, diffuse bilateral interstitial pulmonary opacity, most consistent with mild edema. No focal airspace opacity. Electronically Signed   By: Eddie Candle M.D.   On: 12/17/2020 19:18   Echo 11/28/2020   IMPRESSIONS:   1. Left ventricular ejection fraction, by estimation, is 45 to 50%. The  left ventricle has mildly decreased function. The left ventricle  demonstrates regional wall motion abnormalities. Inferolateral  hypokinesis. The left ventricular internal cavity  size was mildly dilated. Left ventricular diastolic parameters are  consistent with Grade II diastolic dysfunction (pseudonormalization).  Elevated left atrial pressure.  2. Right ventricular systolic function is normal. The right ventricular  size is normal. There is normal pulmonary artery systolic pressure. The  estimated right ventricular systolic pressure is 123XX123 mmHg.  3. Left atrial size was mildly dilated.  4. The mitral valve is abnormal. Mild mitral valve regurgitation. No  evidence of mitral stenosis. Moderate mitral annular calcification.  5. The aortic valve  is tricuspid. Aortic valve regurgitation is not  visualized. Mild to moderate aortic valve sclerosis/calcification is  present, without any evidence of aortic stenosis.   EKG: Independently reviewed. Vent. rate 92 BPM PR interval 186 ms QRS duration 102 ms QT/QTc 382/472 ms P-R-T axes 71 37 -72 Sinus rhythm with Premature atrial complexes Left ventricular hypertrophy with repolarization abnormality ( Cornell product ) Abnormal ECG  Assessment/Plan Principal Problem:   Volume overload   Acute on chronic combined diastolic CHF (congestive heart failure) (HCC)   Acute on chronic respiratory failure (HCC) Observation/progressive unit. Continue supplemental oxygen. Hemodialysis tonight. Fluid and sodium restriction. Needs compliance with HD. Nephrology consult appreciated. Cardiology consult appreciated.  Active Problems:   CAD (coronary artery disease) Having anginal pain due to  volume overload. Continue aspirin, metoprolol, nitrates and statin. Dr. Paticia Stack input appreciated.    Essential hypertension Continue amlodipine 10 mg p.o. daily. Continue Imdur 90 mg p.o. daily    ESRD on hemodialysis (HCC) For hemodialysis tonight. Dr. Marval Regal consult appreciated.    Dyslipidemia Continue atorvastatin 80 mg p.o. daily.    Troponin level elevated Demand ischemia and decreased renal clearance.    PAF (paroxysmal atrial fibrillation) (HCC) CHA?DS?-VASc Score of at least 4. Rate has been controlled. Continue daily metoprolol succinate. Not on anticoagulation.        DVT prophylaxis: Lovenox SQ. Code Status:   Full code. Family Communication: Disposition Plan:   Patient is from:  Home.  Anticipated DC to:  Home.  Anticipated DC date:  12/18/2020 or 12/19/2020.  Anticipated DC barriers: Clinical status and consultants signed off  Consults called:  Nephrology (Dr. Coladonato)/cardiology (Dr. Paticia Stack). Admission status:  Observation/progressive unit.  Severity of  Illness:  High after presenting with dyspnea and anginal chest pain due to noncompliance with kidney replacement therapy.  Reubin Milan MD [facility]  How to contact the [facility] Attending or Consulting provider [place] or covering provider during after hours [place], for this patient?   1. Check the care team in [facility] and look for a) attending/consulting TR[facility] provider listed and b) the [facility] team listed 2. Log into [email or web address] and use [place]'s universal password to access. If you do not have the password, please contact the hospital operator. 3. Locate the [facility] provider you are looking for under [facility] and page to a number that you can be directly reached. 4. If you still have difficulty reaching the provider, please page the [facility] (Director on Call) for the Hospitalists listed on amion for assistance.  12/17/2020, 9:44 PM   This document was prepared using Dragon voice recognition software and may contain some unintended transcription errors.

## 2020-12-18 DIAGNOSIS — I25119 Atherosclerotic heart disease of native coronary artery with unspecified angina pectoris: Secondary | ICD-10-CM | POA: Diagnosis present

## 2020-12-18 DIAGNOSIS — I252 Old myocardial infarction: Secondary | ICD-10-CM | POA: Diagnosis not present

## 2020-12-18 DIAGNOSIS — Z20822 Contact with and (suspected) exposure to covid-19: Secondary | ICD-10-CM | POA: Diagnosis present

## 2020-12-18 DIAGNOSIS — Z9119 Patient's noncompliance with other medical treatment and regimen: Secondary | ICD-10-CM | POA: Diagnosis not present

## 2020-12-18 DIAGNOSIS — I480 Paroxysmal atrial fibrillation: Secondary | ICD-10-CM | POA: Diagnosis present

## 2020-12-18 DIAGNOSIS — I340 Nonrheumatic mitral (valve) insufficiency: Secondary | ICD-10-CM | POA: Diagnosis present

## 2020-12-18 DIAGNOSIS — N186 End stage renal disease: Secondary | ICD-10-CM | POA: Diagnosis present

## 2020-12-18 DIAGNOSIS — E785 Hyperlipidemia, unspecified: Secondary | ICD-10-CM | POA: Diagnosis present

## 2020-12-18 DIAGNOSIS — M898X9 Other specified disorders of bone, unspecified site: Secondary | ICD-10-CM | POA: Diagnosis present

## 2020-12-18 DIAGNOSIS — I429 Cardiomyopathy, unspecified: Secondary | ICD-10-CM | POA: Diagnosis present

## 2020-12-18 DIAGNOSIS — I214 Non-ST elevation (NSTEMI) myocardial infarction: Secondary | ICD-10-CM | POA: Diagnosis present

## 2020-12-18 DIAGNOSIS — K219 Gastro-esophageal reflux disease without esophagitis: Secondary | ICD-10-CM | POA: Diagnosis present

## 2020-12-18 DIAGNOSIS — Z8674 Personal history of sudden cardiac arrest: Secondary | ICD-10-CM | POA: Diagnosis not present

## 2020-12-18 DIAGNOSIS — Z825 Family history of asthma and other chronic lower respiratory diseases: Secondary | ICD-10-CM | POA: Diagnosis not present

## 2020-12-18 DIAGNOSIS — R0602 Shortness of breath: Secondary | ICD-10-CM | POA: Diagnosis not present

## 2020-12-18 DIAGNOSIS — E875 Hyperkalemia: Secondary | ICD-10-CM | POA: Diagnosis present

## 2020-12-18 DIAGNOSIS — I509 Heart failure, unspecified: Secondary | ICD-10-CM

## 2020-12-18 DIAGNOSIS — I132 Hypertensive heart and chronic kidney disease with heart failure and with stage 5 chronic kidney disease, or end stage renal disease: Secondary | ICD-10-CM | POA: Diagnosis present

## 2020-12-18 DIAGNOSIS — N250 Renal osteodystrophy: Secondary | ICD-10-CM | POA: Diagnosis not present

## 2020-12-18 DIAGNOSIS — Z992 Dependence on renal dialysis: Secondary | ICD-10-CM | POA: Diagnosis not present

## 2020-12-18 DIAGNOSIS — Z87891 Personal history of nicotine dependence: Secondary | ICD-10-CM | POA: Diagnosis not present

## 2020-12-18 DIAGNOSIS — E8779 Other fluid overload: Secondary | ICD-10-CM | POA: Diagnosis not present

## 2020-12-18 DIAGNOSIS — I5043 Acute on chronic combined systolic (congestive) and diastolic (congestive) heart failure: Secondary | ICD-10-CM | POA: Diagnosis present

## 2020-12-18 DIAGNOSIS — I2510 Atherosclerotic heart disease of native coronary artery without angina pectoris: Secondary | ICD-10-CM | POA: Diagnosis not present

## 2020-12-18 DIAGNOSIS — D631 Anemia in chronic kidney disease: Secondary | ICD-10-CM | POA: Diagnosis not present

## 2020-12-18 DIAGNOSIS — Z7902 Long term (current) use of antithrombotics/antiplatelets: Secondary | ICD-10-CM | POA: Diagnosis not present

## 2020-12-18 DIAGNOSIS — J9601 Acute respiratory failure with hypoxia: Secondary | ICD-10-CM | POA: Diagnosis present

## 2020-12-18 DIAGNOSIS — Z7982 Long term (current) use of aspirin: Secondary | ICD-10-CM | POA: Diagnosis not present

## 2020-12-18 DIAGNOSIS — Z9114 Patient's other noncompliance with medication regimen: Secondary | ICD-10-CM | POA: Diagnosis not present

## 2020-12-18 LAB — TROPONIN I (HIGH SENSITIVITY)
Troponin I (High Sensitivity): 941 ng/L (ref <18)
Troponin I (High Sensitivity): 3535 ng/L (ref <18)

## 2020-12-18 LAB — RENAL FUNCTION PANEL
Albumin: 3.1 g/dL — ABNORMAL LOW (ref 3.5–5.0)
Anion gap: 16 — ABNORMAL HIGH (ref 5–15)
BUN: 58 mg/dL — ABNORMAL HIGH (ref 8–23)
CO2: 25 mmol/L (ref 22–32)
Calcium: 9 mg/dL (ref 8.9–10.3)
Chloride: 99 mmol/L (ref 98–111)
Creatinine, Ser: 12.34 mg/dL — ABNORMAL HIGH (ref 0.61–1.24)
GFR, Estimated: 4 mL/min — ABNORMAL LOW (ref >60)
Glucose, Bld: 82 mg/dL (ref 70–99)
Phosphorus: 5.7 mg/dL — ABNORMAL HIGH (ref 2.5–4.6)
Potassium: 4.9 mmol/L (ref 3.5–5.1)
Sodium: 140 mmol/L (ref 135–145)

## 2020-12-18 LAB — CBC
HCT: 27.3 % — ABNORMAL LOW (ref 39.0–52.0)
Hemoglobin: 9.2 g/dL — ABNORMAL LOW (ref 13.0–17.0)
MCH: 32.7 pg (ref 26.0–34.0)
MCHC: 33.7 g/dL (ref 30.0–36.0)
MCV: 97.2 fL (ref 80.0–100.0)
Platelets: 251 10*3/uL (ref 150–400)
RBC: 2.81 MIL/uL — ABNORMAL LOW (ref 4.22–5.81)
RDW: 18 % — ABNORMAL HIGH (ref 11.5–15.5)
WBC: 9.1 10*3/uL (ref 4.0–10.5)
nRBC: 0 % (ref 0.0–0.2)

## 2020-12-18 LAB — HEPATITIS B SURFACE ANTIGEN: Hepatitis B Surface Ag: NONREACTIVE

## 2020-12-18 MED ORDER — METOPROLOL SUCCINATE ER 25 MG PO TB24
25.0000 mg | ORAL_TABLET | Freq: Every day | ORAL | Status: DC
  Administered 2020-12-18 – 2020-12-19 (×2): 25 mg via ORAL
  Filled 2020-12-18 (×2): qty 1

## 2020-12-18 MED ORDER — HEPARIN SODIUM (PORCINE) 1000 UNIT/ML DIALYSIS: 2400.0000 [IU] | Freq: Once | INTRAMUSCULAR | Status: DC

## 2020-12-18 MED ORDER — LANTHANUM CARBONATE 500 MG PO CHEW
1000.0000 mg | CHEWABLE_TABLET | Freq: Three times a day (TID) | ORAL | Status: DC
  Administered 2020-12-18 – 2020-12-19 (×2): 1000 mg via ORAL
  Filled 2020-12-18 (×4): qty 2

## 2020-12-18 MED ORDER — SODIUM CHLORIDE 0.9 % IV SOLN
100.0000 mL | INTRAVENOUS | Status: DC | PRN
Start: 2020-12-18 — End: 2020-12-18

## 2020-12-18 MED ORDER — ALTEPLASE 2 MG IJ SOLR: 2.0000 mg | Freq: Once | INTRAMUSCULAR | Status: DC | PRN

## 2020-12-18 MED ORDER — PANTOPRAZOLE SODIUM 40 MG PO TBEC
40.0000 mg | DELAYED_RELEASE_TABLET | Freq: Every day | ORAL | Status: DC
  Administered 2020-12-18 – 2020-12-19 (×2): 40 mg via ORAL
  Filled 2020-12-18 (×2): qty 1

## 2020-12-18 MED ORDER — SODIUM CHLORIDE 0.9 % IV SOLN: 100.0000 mL | INTRAVENOUS | Status: DC | PRN

## 2020-12-18 MED ORDER — LIDOCAINE-PRILOCAINE 2.5-2.5 % EX CREA: 1.0000 "application " | TOPICAL_CREAM | CUTANEOUS | Status: DC | PRN

## 2020-12-18 MED ORDER — HYDROMORPHONE HCL 1 MG/ML IJ SOLN
1.0000 mg | INTRAMUSCULAR | Status: AC | PRN
  Administered 2020-12-18 (×2): 1 mg via INTRAVENOUS
  Filled 2020-12-18 (×2): qty 1

## 2020-12-18 MED ORDER — ONDANSETRON HCL 4 MG/2ML IJ SOLN
4.0000 mg | Freq: Once | INTRAMUSCULAR | Status: AC
  Administered 2020-12-18: 4 mg via INTRAVENOUS
  Filled 2020-12-18: qty 2

## 2020-12-18 MED ORDER — HEPARIN SODIUM (PORCINE) 1000 UNIT/ML DIALYSIS
1000.0000 [IU] | INTRAMUSCULAR | Status: DC | PRN
  Filled 2020-12-18: qty 1

## 2020-12-18 MED ORDER — LIDOCAINE HCL (PF) 1 % IJ SOLN
5.0000 mL | INTRAMUSCULAR | Status: DC | PRN
Start: 2020-12-18 — End: 2020-12-18

## 2020-12-18 MED ORDER — ISOSORBIDE MONONITRATE ER 60 MG PO TB24
90.0000 mg | ORAL_TABLET | Freq: Every day | ORAL | Status: DC
  Administered 2020-12-19: 90 mg via ORAL
  Filled 2020-12-18: qty 1

## 2020-12-18 MED ORDER — PENTAFLUOROPROP-TETRAFLUOROETH EX AERO
1.0000 | INHALATION_SPRAY | CUTANEOUS | Status: DC | PRN
Start: 2020-12-18 — End: 2020-12-18

## 2020-12-18 MED ORDER — METOPROLOL SUCCINATE ER 25 MG PO TB24: 25.0000 mg | ORAL_TABLET | Freq: Every day | ORAL | Status: DC

## 2020-12-18 NOTE — Progress Notes (Signed)
Spoke with charge nurse Roselyn Reef, RN in ED to obtain pt. Bed assignment. Charge nurse states will call back with bed.

## 2020-12-18 NOTE — ED Notes (Signed)
Pt called out stating that he needed oxygen. On nurses arrival to room, pt was out of bed standing near the door. Pt was [place] was not connected to oxygen. Assisted patient back to bed. Placed on 2lpm via [place].

## 2020-12-18 NOTE — Progress Notes (Signed)
PROGRESS NOTE    Kenneth [name]  [name] DOB: 01-29-1956 DOA: 12/17/2020 PCP: [name], MD   Brief Narrative:  Kenneth [name] is a 65 y.o. male with medical history significant of alcoholism, chronic combined systolic and diastolic CHF, CAD, history of MI, history of cardiac arrest with PEA, atrial flutter not on anticoagulation due to history of EtOH consumption, ESRD on hemodialysis, hypertension who is brought to the emergency department due to progressively worse dyspnea associated with intermittent chest pain, orthopnea for the past few days.  ED course: BP: 160/86, tachypneic, O2 sats 100% on NRB, BNP: 2695, troponin I 85 and then 267.  Chest x-ray shows cardiomegaly with mild diffuse bilateral interstitial pulmonary edema.  Cardiology nephrology consulted and patient admitted for further evaluation and management.  Assessment & Plan:  Acute on chronic hypoxemic respiratory failure in the setting of acute on chronic combined systolic and diastolic CHF Volume overload: -Likely in the setting of noncompliance. -Initially requiring NRB-currently on nasal cannula.  Reviewed chest x-ray. -Nephrology and cardiology consulted-appreciate help -Strict INO's and daily weight -Monitor electrolytes closely  Coronary artery disease: -Troponin I 85 trended up to 267--> 941.  Patient denies ACS symptoms at this time. -Trend troponin.  Continue aspirin and Plavix and statin -Continue amlodipine, Imdur, metoprolol -Continue nitro &  Dilaudid as needed for pain control -Cardiology consulted-recommend that patient is not a candidate for invasive options at this time due to social issues and noncompliance with his medications. -Appreciate cardiology's recommendation  Hypertension: -Likely in the setting of fluid overload -Blood pressure elevated.  Continue amlodipine and Imdur -Monitor blood pressure closely  ESRD on hemodialysis: -Nephrology on board-patient is currently getting  dialysis  Hyperlipidemia: Continue statin  Paroxysmal A. Fib: -Continue metoprolol.  Not on anticoagulation at this time.  GERD: Continue PPI  Normocytic anemia: -In the setting of ESRD.  H&H is stable.  Continue to monitor  DVT prophylaxis: Heparin Code Status: Full code Family Communication:  None present at bedside.  Plan of care discussed with patient in length and he verbalized understanding and agreed with it. Disposition Plan: Home in 1 day  Consultants:   Cardiology  Nephrology  Procedures:   Dialysis  Antimicrobials:   None  Status is: Observation  Dispo: The patient is from: Home              Anticipated d/c is to: Home              Anticipated d/c date is: 1 day              Patient currently is not medically stable to d/c.   Difficult to place patient No    Subjective: Patient seen and examined in hemodialysis.  Sleepy but arousable and answers appropriately.  Denies chest pain, shortness of breath, leg swelling, orthopnea or PND.  Requested for nebulizer for home use.  Objective: Vitals:   12/18/20 0934 12/18/20 1000 12/18/20 1030 12/18/20 1100  BP: 107/74 106/71 101/78 (!) 143/74  Pulse:   (!) 107   Resp:  18 (!) 21 16  Temp:      TempSrc:      SpO2:   99%   Weight:      Height:       No intake or output data in the 24 hours ending 12/18/20 1125 Filed Weights   12/17/20 1828 12/18/20 0700  Weight: 88 kg 88.5 kg    Examination:  General exam: Appears older than his age, on  nasal cannula, sleepy but arousable and answers appropriately Respiratory system: Crackles on the bases. Cardiovascular system: S1 & S2 heard, RRR. No JVD, murmurs, rubs, gallops or clicks. No pedal edema. Gastrointestinal system: Abdomen is nondistended, soft and nontender. No organomegaly or masses felt. Normal bowel sounds heard. Central nervous system: Sleepy but arousable and answers appropriately and following commands. Extremities: Symmetric 5 x 5  power. Skin: No rashes, lesions or ulcers Psychiatry: Judgement and insight appear normal. Mood & affect appropriate.    Data Reviewed: I have personally reviewed following labs and imaging studies  CBC: Recent Labs  Lab 12/17/20 1743 12/18/20 0245  WBC 9.2 9.1  HGB 9.6* 9.2*  HCT 29.8* 27.3*  MCV 99.3 97.2  PLT 254 [id number]   Basic Metabolic Panel: Recent Labs  Lab 12/17/20 1743 12/18/20 0245  NA 140 140  K 4.6 4.9  CL 97* 99  CO2 29 25  GLUCOSE 89 82  BUN 52* 58*  CREATININE 11.23* 12.34*  CALCIUM 9.2 9.0  PHOS  --  5.7*   GFR: Estimated Creatinine Clearance: 6.2 mL/min (A) (by C-G formula based on SCr of 12.34 mg/dL (H)). Liver Function Tests: Recent Labs  Lab 12/18/20 0245  ALBUMIN 3.1*   No results for input(s): LIPASE, AMYLASE in the last 168 hours. No results for input(s): AMMONIA in the last 168 hours. Coagulation Profile: No results for input(s): INR, PROTIME in the last 168 hours. Cardiac Enzymes: No results for input(s): CKTOTAL, CKMB, CKMBINDEX, TROPONINI in the last 168 hours. BNP (last 3 results) No results for input(s): PROBNP in the last 8760 hours. HbA1C: No results for input(s): HGBA1C in the last 72 hours. CBG: No results for input(s): GLUCAP in the last 168 hours. Lipid Profile: No results for input(s): CHOL, HDL, LDLCALC, TRIG, CHOLHDL, LDLDIRECT in the last 72 hours. Thyroid Function Tests: No results for input(s): TSH, T4TOTAL, FREET4, T3FREE, THYROIDAB in the last 72 hours. Anemia Panel: No results for input(s): VITAMINB12, FOLATE, FERRITIN, TIBC, IRON, RETICCTPCT in the last 72 hours. Sepsis Labs: No results for input(s): PROCALCITON, LATICACIDVEN in the last 168 hours.  Recent Results (from the past 240 hour(s))  Resp Panel by RT-PCR (Flu A&B, Covid) Nasopharyngeal Swab     Status: None   Collection Time: 12/17/20  8:00 PM   Specimen: Nasopharyngeal Swab; Nasopharyngeal(NP) swabs in vial transport medium  Result Value Ref Range  Status   SARS Coronavirus 2 by RT PCR NEGATIVE NEGATIVE Final    Comment: (NOTE) SARS-CoV-2 target nucleic acids are NOT DETECTED.  The SARS-CoV-2 RNA is generally detectable in upper respiratory specimens during the acute phase of infection. The lowest concentration of SARS-CoV-2 viral copies this assay can detect is 138 copies/mL. A negative result does not preclude SARS-Cov-2 infection and should not be used as the sole basis for treatment or other patient management decisions. A negative result may occur with  improper specimen collection/handling, submission of specimen other than nasopharyngeal swab, presence of viral mutation(s) within the areas targeted by this assay, and inadequate number of viral copies(<138 copies/mL). A negative result must be combined with clinical observations, patient history, and epidemiological information. The expected result is Negative.  Fact Sheet for Patients:  EntrepreneurPulse.com.au  Fact Sheet for Healthcare Providers:  IncredibleEmployment.be  This test is no t yet approved or cleared by the [place] FDA and  has been authorized for detection and/or diagnosis of SARS-CoV-2 by FDA under an Emergency Use Authorization (EUA). This EUA will remain  in effect (meaning this  test can be used) for the duration of the COVID-19 declaration under Section 564(b)(1) of the Act, 21 U.S.C.section 360bbb-3(b)(1), unless the authorization is terminated  or revoked sooner.       Influenza A by PCR NEGATIVE NEGATIVE Final   Influenza B by PCR NEGATIVE NEGATIVE Final    Comment: (NOTE) The Xpert Xpress SARS-CoV-2/FLU/RSV plus assay is intended as an aid in the diagnosis of influenza from Nasopharyngeal swab specimens and should not be used as a sole basis for treatment. Nasal washings and aspirates are unacceptable for Xpert Xpress SARS-CoV-2/FLU/RSV testing.  Fact Sheet for  Patients: EntrepreneurPulse.com.au  Fact Sheet for Healthcare Providers: IncredibleEmployment.be  This test is not yet approved or cleared by the [place] FDA and has been authorized for detection and/or diagnosis of SARS-CoV-2 by FDA under an Emergency Use Authorization (EUA). This EUA will remain in effect (meaning this test can be used) for the duration of the COVID-19 declaration under Section 564(b)(1) of the Act, 21 U.S.C. section 360bbb-3(b)(1), unless the authorization is terminated or revoked.  Performed at [facility], [place] [address], [place], [place] [address]       Radiology Studies: DG Chest 2 View  Result Date: 12/17/2020 CLINICAL DATA:  Shortness of breath EXAM: CHEST - 2 VIEW COMPARISON:  11/27/2020 FINDINGS: Cardiomegaly. Mild, diffuse bilateral interstitial pulmonary opacity. The visualized skeletal structures are unremarkable. IMPRESSION: Cardiomegaly with mild, diffuse bilateral interstitial pulmonary opacity, most consistent with mild edema. No focal airspace opacity. Electronically Signed   By: Eddie Candle M.D.   On: 12/17/2020 19:18    Scheduled Meds: . amLODipine  5 mg Oral Daily  . atorvastatin  80 mg Oral QHS  . Chlorhexidine Gluconate Cloth  6 each Topical Q0600  . clopidogrel  75 mg Oral QPM  . heparin  2,400 Units Dialysis Once in dialysis  . heparin  5,000 Units Subcutaneous Q8H  . isosorbide mononitrate  90 mg Oral Daily  . lanthanum  1,000 mg Oral TID with meals  . metoprolol succinate  25 mg Oral Daily  . pantoprazole  40 mg Oral QAC breakfast   Continuous Infusions: . sodium chloride    . sodium chloride       LOS: 0 days   Time spent: 35 minutes   Jacquise Rarick Loann Quill, MD [facility]  If 7PM-7AM, please contact night-coverage [email or web address] 12/18/2020, 11:25 AM

## 2020-12-18 NOTE — ED Notes (Signed)
Checked status of transport request, states dispatched. Patient resting peacefully on stretcher, respirations even and unlabored. Patient aware of pending transport. Denies further needs.

## 2020-12-18 NOTE — Progress Notes (Signed)
[facility] Progress Note   Subjective:   Patient seen and examined at bedside in dialysis.  Reports improvement in breathing.  Denies CP, n/v, abdominal pain, weakness and fatigue.  Admits to diarrhea.    Objective Vitals:   12/18/20 0934 12/18/20 1000 12/18/20 1030 12/18/20 1100  BP: 107/74 106/71 101/78 (!) 143/74  Pulse:   (!) 107   Resp:  18 (!) 21 16  Temp:      TempSrc:      SpO2:   99%   Weight:      Height:       Physical Exam General:chronically ill appearing male in NAD Heart:RRR, no mrg Lungs:+crackles b/l, nml WOB on 2L via [place] Abdomen:soft, NTND Extremities:trace LE edema Dialysis Access: LU AVF in use   Filed Weights   12/17/20 1828 12/18/20 0700  Weight: 88 kg 88.5 kg   No intake or output data in the 24 hours ending 12/18/20 1135  Additional Objective Labs: Basic Metabolic Panel: Recent Labs  Lab 12/17/20 1743 12/18/20 0245  NA 140 140  K 4.6 4.9  CL 97* 99  CO2 29 25  GLUCOSE 89 82  BUN 52* 58*  CREATININE 11.23* 12.34*  CALCIUM 9.2 9.0  PHOS  --  5.7*   Liver Function Tests: Recent Labs  Lab 12/18/20 0245  ALBUMIN 3.1*   CBC: Recent Labs  Lab 12/17/20 1743 12/18/20 0245  WBC 9.2 9.1  HGB 9.6* 9.2*  HCT 29.8* 27.3*  MCV 99.3 97.2  PLT 254 251   Studies/Results: DG Chest 2 View  Result Date: 12/17/2020 CLINICAL DATA:  Shortness of breath EXAM: CHEST - 2 VIEW COMPARISON:  11/27/2020 FINDINGS: Cardiomegaly. Mild, diffuse bilateral interstitial pulmonary opacity. The visualized skeletal structures are unremarkable. IMPRESSION: Cardiomegaly with mild, diffuse bilateral interstitial pulmonary opacity, most consistent with mild edema. No focal airspace opacity. Electronically Signed   By: Eddie Candle M.D.   On: 12/17/2020 19:18    Medications: . sodium chloride    . sodium chloride     . amLODipine  5 mg Oral Daily  . atorvastatin  80 mg Oral QHS  . Chlorhexidine Gluconate Cloth  6 each Topical Q0600  .  clopidogrel  75 mg Oral QPM  . heparin  2,400 Units Dialysis Once in dialysis  . heparin  5,000 Units Subcutaneous Q8H  . isosorbide mononitrate  90 mg Oral Daily  . lanthanum  1,000 mg Oral TID with meals  . metoprolol succinate  25 mg Oral Daily  . pantoprazole  40 mg Oral QAC breakfast    Dialysis Orders: Center:[facility]. TueThuSat, 4 hrs 0 min, 180NRe Optiflux, BFR 400, DFR Manual 800 mL/min, EDW 82.5 (kg), Dialysate 2.0 K, 2.0 Ca Heparin 2400 unit bolus sensipar 30 mg q HD mircera 75 mcg q 2 weeks- last dose 1/13 Calcitriol 2.5 mcg PO q HD  Assessment/Plan: 1. Volume overload: due to non compliance with OP dialysis, frequently shortened treatments and large intra dialyetic weight gains. Not meeting OP dry weight.  CXR consistent with volume overload.  HD today with UF goal 4.5L.  2. ESRD:Dialyzes TTS, volume overload as above. HD today per regular schedule.  Next HD on 2/24.  3. Hypertension:Resume home BP meds. BP improving with HD.  4. Anemia: Hgb 9.2.  Due for ESA.  Will order with HD on Thursday if still admitted, if not will give at OP HD.   5. Metabolic bone disease:Ca in goal.  Phos mildly elevated.  Resume VDRA, sensipar and  binders. 6. Nutrition:Renal diet with fluid restrictions.   7. Severe multivessel [id number] hospitalizations for the same. Cardiology recommended medical management, not a candidate for CABG or PCI. No chest pain at present.  8. Chronic combined systolic and diastolic HF  9. A fib - on metoprolol.  Not on anticoagulation.    Jen Mow, PA-C [facility] 12/18/2020,11:35 AM  LOS: 0 days

## 2020-12-18 NOTE — Progress Notes (Signed)
Progress Note  Patient Name: Kenneth [name] Date of Encounter: 12/18/2020  [facility] Cardiologist: Peter [name], MD  Subjective   No chest pain currently. Had dialysis this am.  Inpatient Medications    Scheduled Meds: . amLODipine  5 mg Oral Daily  . atorvastatin  80 mg Oral QHS  . Chlorhexidine Gluconate Cloth  6 each Topical Q0600  . clopidogrel  75 mg Oral QPM  . heparin  5,000 Units Subcutaneous Q8H  . isosorbide mononitrate  90 mg Oral Daily  . lanthanum  1,000 mg Oral TID with meals  . metoprolol succinate  25 mg Oral Daily  . pantoprazole  40 mg Oral QAC breakfast   Continuous Infusions:  PRN Meds: acetaminophen **OR** acetaminophen, famotidine, nitroGLYCERIN, ondansetron **OR** ondansetron (ZOFRAN) IV   Vital Signs    Vitals:   12/18/20 1100 12/18/20 1115 12/18/20 1300 12/18/20 1322  BP: (!) 143/74 136/77 (!) 100/59 101/68  Pulse:   90 (!) 112  Resp: '16 19 19 18  '$ Temp:  (!) 97.4 F (36.3 C)  98.3 F (36.8 C)  TempSrc:  Oral  Oral  SpO2:  92% 92% 100%  Weight:      Height:        Intake/Output Summary (Last 24 hours) at 12/18/2020 1424 Last data filed at 12/18/2020 1104 Gross per 24 hour  Intake -  Output 3131 ml  Net -3131 ml   Last 3 Weights 12/18/2020 12/17/2020 12/12/2020  Weight (lbs) 195 lb 1.7 oz 194 lb 187 lb  Weight (kg) 88.5 kg 87.998 kg 84.823 kg      Telemetry    NSR - Personally Reviewed  ECG    NSR with LVH and ST-T wave depression in lateral leads.  - Personally Reviewed  Physical Exam   GEN: No acute distress.   Neck: No JVD Cardiac: RRR, no murmurs, rubs, or gallops.  Respiratory: Clear to auscultation bilaterally. GI: Soft, nontender, non-distended  MS: No edema; No deformity. Neuro:  Nonfocal  Psych: Normal affect   Labs    High Sensitivity Troponin:   Recent Labs  Lab 11/27/20 0937 11/27/20 1150 12/17/20 1743 12/17/20 2045 12/18/20 0629  TROPONINIHS 362* 324* 185* 267* 941*      Chemistry Recent  Labs  Lab 12/17/20 1743 12/18/20 0245  NA 140 140  K 4.6 4.9  CL 97* 99  CO2 29 25  GLUCOSE 89 82  BUN 52* 58*  CREATININE 11.23* 12.34*  CALCIUM 9.2 9.0  ALBUMIN  --  3.1*  GFRNONAA 5* 4*  ANIONGAP 14 16*     Hematology Recent Labs  Lab 12/17/20 1743 12/18/20 0245  WBC 9.2 9.1  RBC 3.00* 2.81*  HGB 9.6* 9.2*  HCT 29.8* 27.3*  MCV 99.3 97.2  MCH 32.0 32.7  MCHC 32.2 33.7  RDW 18.0* 18.0*  PLT 254 251    BNP Recent Labs  Lab 12/17/20 1828  BNP 2,695.0*     DDimer No results for input(s): DDIMER in the last 168 hours.   Radiology    DG Chest 2 View  Result Date: 12/17/2020 CLINICAL DATA:  Shortness of breath EXAM: CHEST - 2 VIEW COMPARISON:  11/27/2020 FINDINGS: Cardiomegaly. Mild, diffuse bilateral interstitial pulmonary opacity. The visualized skeletal structures are unremarkable. IMPRESSION: Cardiomegaly with mild, diffuse bilateral interstitial pulmonary opacity, most consistent with mild edema. No focal airspace opacity. Electronically Signed   By: Eddie Candle M.D.   On: 12/17/2020 19:18    Cardiac Studies   Echo: 11/28/20:  IMPRESSIONS    1. Left ventricular ejection fraction, by estimation, is 45 to 50%. The  left ventricle has mildly decreased function. The left ventricle  demonstrates regional wall motion abnormalities. Inferolateral  hypokinesis. The left ventricular internal cavity  size was mildly dilated. Left ventricular diastolic parameters are  consistent with Grade II diastolic dysfunction (pseudonormalization).  Elevated left atrial pressure.  2. Right ventricular systolic function is normal. The right ventricular  size is normal. There is normal pulmonary artery systolic pressure. The  estimated right ventricular systolic pressure is [id number] mmHg.  3. Left atrial size was mildly dilated.  4. The mitral valve is abnormal. Mild mitral valve regurgitation. No  evidence of mitral stenosis. Moderate mitral annular calcification.  5. The  aortic valve is tricuspid. Aortic valve regurgitation is not  visualized. Mild to moderate aortic valve sclerosis/calcification is  present, without any evidence of aortic stenosis.   Cardiac cath 05/14/20:  LEFT HEART CATH AND CORONARY ANGIOGRAPHY    Conclusion  1.  Severe multivessel coronary artery disease with severe calcific distal left main stenosis, severe diffuse ostial/proximal LAD stenosis, severe stenosis of the proximal left circumflex/first OM, and interval occlusion of the distal RCA with left-to-right collaterals 2.  Heavily calcified distal abdominal aorta and bilateral iliac arteries with mild diffuse calcified disease.  No severe iliac lesions identified.  Recommend ongoing medical therapy.  With total occlusion of the RCA and severe calcification of the distal left main and complex disease in the LAD and left circumflex, I think risk of protected PCI would be extremely high.  I also think his access is marginal for large bore support devices from the groin.   Patient Profile     65 y.o. male with ESRD and known severe CAD admitted with chest pain.   Assessment & Plan    Impression: Severe multivessel CAD with NSTEMI. May be demand ischemia given significantly elevated BP. Now BP low post cath. Patient reports chest pain all weekend. States he doesn't have Ntg to take. He has been seen numerous times in the hospital over the past 2 years with chest pain. Based on his anatomy he is not a candidate for revascularization. He is also poorly compliant from a medical standpoint. I would continue prior medical therapy. Please give Rx for sl Ntg. Was seen by Palliative care in December. We have little else to offer  [facility] will sign off.   Medication Recommendations:  Per [facility] Other recommendations (labs, testing, etc):  none Follow up as an outpatient:  As needed.  For questions or updates, please contact [place] Please consult [email or web address] for contact info  under        Signed, Peter [name], MD  12/18/2020, 2:24 PM

## 2020-12-18 NOTE — ED Notes (Signed)
Dr. Doristine Bosworth notified of heart rate and blood pressure.

## 2020-12-18 NOTE — Progress Notes (Signed)
Pt. Assigned to [id number]. Telephone receiving nurse no answer. Will re attempt to notify to receive HD report.

## 2020-12-19 ENCOUNTER — Other Ambulatory Visit: Payer: Self-pay | Admitting: Internal Medicine

## 2020-12-19 DIAGNOSIS — I5043 Acute on chronic combined systolic (congestive) and diastolic (congestive) heart failure: Secondary | ICD-10-CM | POA: Diagnosis not present

## 2020-12-19 LAB — MAGNESIUM: Magnesium: 1.9 mg/dL (ref 1.7–2.4)

## 2020-12-19 LAB — RENAL FUNCTION PANEL
Albumin: 2.9 g/dL — ABNORMAL LOW (ref 3.5–5.0)
Anion gap: 14 (ref 5–15)
BUN: 38 mg/dL — ABNORMAL HIGH (ref 8–23)
CO2: 26 mmol/L (ref 22–32)
Calcium: 8.4 mg/dL — ABNORMAL LOW (ref 8.9–10.3)
Chloride: 96 mmol/L — ABNORMAL LOW (ref 98–111)
Creatinine, Ser: 8.49 mg/dL — ABNORMAL HIGH (ref 0.61–1.24)
GFR, Estimated: 6 mL/min — ABNORMAL LOW (ref >60)
Glucose, Bld: 129 mg/dL — ABNORMAL HIGH (ref 70–99)
Phosphorus: 4.2 mg/dL (ref 2.5–4.6)
Potassium: 4.2 mmol/L (ref 3.5–5.1)
Sodium: 136 mmol/L (ref 135–145)

## 2020-12-19 LAB — MRSA PCR SCREENING: MRSA by PCR: NEGATIVE

## 2020-12-19 MED ORDER — ATORVASTATIN CALCIUM 80 MG PO TABS: 80.0000 mg | ORAL_TABLET | Freq: Every day | ORAL | 3 refills | Status: DC

## 2020-12-19 MED ORDER — CALCITRIOL 0.5 MCG PO CAPS: 2.5000 ug | ORAL_CAPSULE | ORAL | Status: DC

## 2020-12-19 MED ORDER — CLOPIDOGREL BISULFATE 75 MG PO TABS: 75.0000 mg | ORAL_TABLET | Freq: Every evening | ORAL | 3 refills | Status: DC

## 2020-12-19 MED ORDER — NITROGLYCERIN 0.4 MG SL SUBL: 0.4000 mg | SUBLINGUAL_TABLET | SUBLINGUAL | 1 refills | Status: DC | PRN

## 2020-12-19 MED ORDER — CHLORHEXIDINE GLUCONATE CLOTH 2 % EX PADS: 6.0000 | MEDICATED_PAD | Freq: Every day | CUTANEOUS | Status: DC

## 2020-12-19 MED ORDER — CINACALCET HCL 30 MG PO TABS: 30.0000 mg | ORAL_TABLET | ORAL | Status: DC

## 2020-12-19 MED ORDER — METOPROLOL SUCCINATE ER 25 MG PO TB24: 25.0000 mg | ORAL_TABLET | Freq: Two times a day (BID) | ORAL | 0 refills | Status: AC

## 2020-12-19 MED ORDER — AMLODIPINE BESYLATE 10 MG PO TABS: 5.0000 mg | ORAL_TABLET | Freq: Every day | ORAL | 3 refills | Status: AC

## 2020-12-19 MED ORDER — ASPIRIN 81 MG PO TBEC: 81.0000 mg | DELAYED_RELEASE_TABLET | Freq: Every day | ORAL | 11 refills | Status: DC

## 2020-12-19 MED ORDER — ISOSORBIDE MONONITRATE ER 30 MG PO TB24: 90.0000 mg | ORAL_TABLET | Freq: Every day | ORAL | 0 refills | Status: DC

## 2020-12-19 MED FILL — ASPIRIN LOW DOSE 81 MG TBEC: 81 | 30 days supply | Qty: 30 | Fill #0

## 2020-12-19 MED FILL — METOPROLOL SUCCINATE ER 25: 25 | 30 days supply | Qty: 60 | Fill #0

## 2020-12-19 MED FILL — NITROGLYCERIN 0.4 MG TAB SL: 0.4 | 8 days supply | Qty: 25 | Fill #0

## 2020-12-19 MED FILL — ATORVASTATIN CALCIUM 80 MG: 80 | 30 days supply | Qty: 30 | Fill #0

## 2020-12-19 MED FILL — CLOPIDOGREL 75 MG TABLET: 75 | 30 days supply | Qty: 30 | Fill #0

## 2020-12-19 MED FILL — ISOSORBIDE MN ER 30 MG TAB: 30 | 30 days supply | Qty: 90 | Fill #0

## 2020-12-19 NOTE — Discharge Summary (Signed)
Physician Discharge Summary  Kenneth [name] [name] DOB: 1956/06/22 DOA: 12/17/2020  PCP: [name], MD  Admit date: 12/17/2020 Discharge date: 12/19/2020  Admitted From: Home  Disposition:  Home   Recommendations for Outpatient Follow-up:  1. Follow up with PCP in 1-2 weeks 2. Please obtain BMP/CBC in one week 3. Follow up with cardiology   Home Health: none  Discharge Condition; stable.  CODE STATUS: full code Diet recommendation: Heart Healthy  Brief/Interim Summary: Kenneth Ochsner Jonesis a 65 y.o.malewith medical history significant ofalcoholism, chronic combined systolic and diastolic CHF, CAD, history of MI, history of cardiac arrest with PEA, atrial flutter not on anticoagulation due to history of EtOH consumption, ESRD on hemodialysis, hypertension who is brought to the emergency department due to progressively worse dyspnea associated with intermittent chest pain, orthopnea for the past few days.  ED course: BP: 160/86, tachypneic, O2 sats 100% on NRB, BNP: 2695, troponin I 85 and then 267.  Chest x-ray shows cardiomegaly with mild diffuse bilateral interstitial pulmonary edema.  Cardiology nephrology consulted and patient admitted for further evaluation and management.  Patient had cardiac cath 05/14/2020 with severe multivessel coronary artery disease, not a candidate for intervention   Acute on chronic hypoxemic respiratory failure in the setting of acute on chronic combined systolic and diastolic CHF Volume overload: -Likely in the setting of noncompliance. -Initially requiring NRB-currently on nasal cannula.  Reviewed chest x-ray. -Nephrology and cardiology consulted-appreciate help   NSTEMI, Coronary artery disease: Severe multivessel CAD -Troponin I 85 trended up to 267--> 941--3000.  Patient denies ACS symptoms at this time. -Trend troponin.  Continue aspirin and Plavix and statin -Continue amlodipine, Imdur, metoprolol -Continue nitro &  Dilaudid as  needed for pain control -Cardiology consulted-recommend that patient is not a candidate for invasive options at this time due to social issues and noncompliance with his medications. -Appreciate cardiology's recommendation. Medical management -I have provide refill for nitroglycerin.   Hypertension: -Likely in the setting of fluid overload -Blood pressure elevated.  Continue amlodipine and Imdur   ESRD on hemodialysis: -Nephrology on board-Had HD 2/22  Hyperlipidemia: Continue statin  Paroxysmal A. Fib: -Continue metoprolol.  Not on anticoagulation at this time.  GERD: Continue PPI  Normocytic anemia: -In the setting of ESRD.  H&H is stable.  Continue to monitor   Discharge Diagnoses:  Principal Problem:   Acute on chronic combined systolic and diastolic heart failure (HCC) Active Problems:   Essential hypertension   ESRD on hemodialysis (HCC)   Acute on chronic respiratory failure (HCC)   CAD (coronary artery disease)   Dyslipidemia   Troponin level elevated   PAF (paroxysmal atrial fibrillation) (HCC)   Volume overload   CHF (congestive heart failure) [facility])    Discharge Instructions  Discharge Instructions    Diet - low sodium heart healthy   Complete by: As directed    Increase activity slowly   Complete by: As directed      Allergies as of 12/19/2020   No Known Allergies     Medication List    TAKE these medications   amLODipine 10 MG tablet Commonly known as: NORVASC Take 0.5 tablets (5 mg total) by mouth daily.   aspirin 81 MG EC tablet Take 1 tablet (81 mg total) by mouth daily. Swallow whole.   atorvastatin 80 MG tablet Commonly known as: LIPITOR Take 1 tablet (80 mg total) by mouth at bedtime.   clopidogrel 75 MG tablet Commonly known as: PLAVIX Take 1 tablet (75 mg  total) by mouth every evening.   famotidine 20 MG tablet Commonly known as: PEPCID Take 1 tablet (20 mg total) by mouth 2 (two) times daily as needed for heartburn.    isosorbide mononitrate 30 MG 24 hr tablet Commonly known as: IMDUR Take 3 tablets (90 mg total) by mouth daily.   lanthanum 1000 MG chewable tablet Commonly known as: FOSRENOL Chew 1,000 mg by mouth 3 (three) times daily.   metoprolol succinate 25 MG 24 hr tablet Commonly known as: TOPROL-XL Take 1 tablet (25 mg total) by mouth in the morning and at bedtime.   nitroGLYCERIN 0.4 MG SL tablet Commonly known as: NITROSTAT Place 1 tablet (0.4 mg total) under the tongue every 5 (five) minutes as needed for chest pain (max 3 doses).   pantoprazole 40 MG tablet Commonly known as: PROTONIX Take 1 tablet (40 mg total) by mouth daily before breakfast.   Rena-Vite Rx 1 MG Tabs Take 1 tablet by mouth daily.   Tylenol PM Extra Strength 25-500 MG Tabs tablet Generic drug: diphenhydramine-acetaminophen Take 2 tablets by mouth at bedtime as needed (pain).       No Known Allergies  Consultations:  Cardiology    Procedures/Studies: DG Chest 2 View  Result Date: 12/17/2020 CLINICAL DATA:  Shortness of breath EXAM: CHEST - 2 VIEW COMPARISON:  11/27/2020 FINDINGS: Cardiomegaly. Mild, diffuse bilateral interstitial pulmonary opacity. The visualized skeletal structures are unremarkable. IMPRESSION: Cardiomegaly with mild, diffuse bilateral interstitial pulmonary opacity, most consistent with mild edema. No focal airspace opacity. Electronically Signed   By: Eddie Candle M.D.   On: 12/17/2020 19:18   DG Chest Port 1 View  Result Date: 11/27/2020 CLINICAL DATA:  Shortness of breath. EXAM: PORTABLE CHEST 1 VIEW COMPARISON:  November 19, 2020. FINDINGS: Stable cardiomediastinal silhouette. No pneumothorax or pleural effusion is noted. Stable bilateral patchy airspace opacities are noted consistent with multifocal pneumonia. Bony thorax is unremarkable. IMPRESSION: Stable bilateral multifocal pneumonia. Electronically Signed   By: Marijo Conception M.D.   On: 11/27/2020 09:56   DG Chest Port 1  View  Result Date: 11/19/2020 CLINICAL DATA:  Short of breath, previous tobacco abuse, COPD EXAM: PORTABLE CHEST 1 VIEW COMPARISON:  10/26/2020 FINDINGS: Single frontal view of the chest demonstrates mild enlargement of the cardiac silhouette. There is diffuse increased interstitial prominence with bilateral ground-glass airspace disease. Trace right effusion is noted. No pneumothorax. IMPRESSION: 1. Findings most consistent with congestive heart failure. Electronically Signed   By: Randa Ngo M.D.   On: 11/19/2020 19:52   ECHOCARDIOGRAM COMPLETE  Result Date: 11/28/2020    ECHOCARDIOGRAM REPORT   Patient Name:   Kenneth [name] Date of Exam: 11/28/2020 Medical Rec #:  [id number]      Height:       72.0 in Accession #:    [id number]     Weight:       186.0 lb Date of Birth:  [birth year]      BSA:          2.066 m Patient Age:    65 years       BP:           159/90 mmHg Patient Gender: M              HR:           63 bpm. Exam Location:  Inpatient Procedure: 2D Echo, Cardiac Doppler and Color Doppler Indications:    CHF-Acute Diastolic [id number]  History:  Patient has prior history of Echocardiogram examinations, most                 recent 05/13/2020. Cardiomyopathy and CHF; Previous Myocardial                 Infarction and CAD.  Sonographer:    Bernadene Person RDCS Referring Phys: [id number] [place]  1. Left ventricular ejection fraction, by estimation, is 45 to 50%. The left ventricle has mildly decreased function. The left ventricle demonstrates regional wall motion abnormalities. Inferolateral hypokinesis. The left ventricular internal cavity size was mildly dilated. Left ventricular diastolic parameters are consistent with Grade II diastolic dysfunction (pseudonormalization). Elevated left atrial pressure.  2. Right ventricular systolic function is normal. The right ventricular size is normal. There is normal pulmonary artery systolic pressure. The estimated right ventricular systolic  pressure is 123XX123 mmHg.  3. Left atrial size was mildly dilated.  4. The mitral valve is abnormal. Mild mitral valve regurgitation. No evidence of mitral stenosis. Moderate mitral annular calcification.  5. The aortic valve is tricuspid. Aortic valve regurgitation is not visualized. Mild to moderate aortic valve sclerosis/calcification is present, without any evidence of aortic stenosis. FINDINGS  Left Ventricle: Left ventricular ejection fraction, by estimation, is 45 to 50%. The left ventricle has mildly decreased function. The left ventricle demonstrates regional wall motion abnormalities. The left ventricular internal cavity size was mildly dilated. There is no left ventricular hypertrophy. Left ventricular diastolic parameters are consistent with Grade II diastolic dysfunction (pseudonormalization). Elevated left atrial pressure. Right Ventricle: The right ventricular size is normal. No increase in right ventricular wall thickness. Right ventricular systolic function is normal. There is normal pulmonary artery systolic pressure. The tricuspid regurgitant velocity is 2.10 m/s, and  with an assumed right atrial pressure of 3 mmHg, the estimated right ventricular systolic pressure is 123XX123 mmHg. Left Atrium: Left atrial size was mildly dilated. Right Atrium: Right atrial size was normal in size. Pericardium: There is no evidence of pericardial effusion. Mitral Valve: The mitral valve is abnormal. Moderate mitral annular calcification. Mild mitral valve regurgitation. No evidence of mitral valve stenosis. Tricuspid Valve: The tricuspid valve is normal in structure. Tricuspid valve regurgitation is not demonstrated. Aortic Valve: The aortic valve is tricuspid. Aortic valve regurgitation is not visualized. Mild to moderate aortic valve sclerosis/calcification is present, without any evidence of aortic stenosis. Pulmonic Valve: The pulmonic valve was grossly normal. Pulmonic valve regurgitation is not visualized. Aorta:  The aortic root and ascending aorta are structurally normal, with no evidence of dilitation. IAS/Shunts: The interatrial septum was not well visualized.  LEFT VENTRICLE PLAX 2D LVIDd:         6.00 cm  Diastology LVIDs:         4.10 cm  LV e' medial:    4.40 cm/s LV PW:         1.00 cm  LV E/e' medial:  24.5 LV IVS:        1.00 cm  LV e' lateral:   6.39 cm/s LVOT diam:     1.90 cm  LV E/e' lateral: 16.9 LV SV:         61 LV SV Index:   29 LVOT Area:     2.84 cm  RIGHT VENTRICLE RV S prime:     17.30 cm/s TAPSE (M-mode): 2.0 cm LEFT ATRIUM             Index       RIGHT ATRIUM  Index LA diam:        3.80 cm 1.84 cm/m  RA Area:     14.20 cm LA Vol (A2C):   89.2 ml 43.18 ml/m RA Volume:   35.80 ml  17.33 ml/m LA Vol (A4C):   55.4 ml 26.82 ml/m LA Biplane Vol: 71.0 ml 34.37 ml/m  AORTIC VALVE LVOT Vmax:   105.73 cm/s LVOT Vmean:  75.333 cm/s LVOT VTI:    0.215 m  AORTA Ao Root diam: 3.10 cm Ao Asc diam:  3.30 cm MITRAL VALVE                 TRICUSPID VALVE MV Area (PHT): 3.53 cm      TR Peak grad:   17.6 mmHg MV Decel Time: 215 msec      TR Vmax:        210.00 cm/s MR Peak grad:    114.5 mmHg MR Mean grad:    75.0 mmHg   SHUNTS MR Vmax:         535.00 cm/s Systemic VTI:  0.21 m MR Vmean:        404.0 cm/s  Systemic Diam: 1.90 cm MR PISA:         1.01 cm MR PISA Eff ROA: 6 mm MR PISA Radius:  0.40 cm MV E velocity: 108.00 cm/s MV A velocity: 77.60 cm/s MV E/A ratio:  1.39 Oswaldo Milian MD Electronically signed by Oswaldo Milian MD Signature Date/Time: 11/28/2020/3:43:20 PM    Final    VAS [place] LOWER EXTREMITY VENOUS (DVT)  Result Date: 11/20/2020  Lower Venous DVT Study Indications: Elevated Ddimer.  Risk Factors: None identified. Comparison Study: No prior studies. Performing Technologist: Oliver Hum RVT  Examination Guidelines: A complete evaluation includes B-mode imaging, spectral Doppler, color Doppler, and power Doppler as needed of all accessible portions of each vessel.  Bilateral testing is considered an integral part of a complete examination. Limited examinations for reoccurring indications may be performed as noted. The reflux portion of the exam is performed with the patient in reverse Trendelenburg.  +---------+---------------+---------+-----------+----------+--------------+ RIGHT    CompressibilityPhasicitySpontaneityPropertiesThrombus Aging +---------+---------------+---------+-----------+----------+--------------+ CFV      Full           Yes      Yes                                 +---------+---------------+---------+-----------+----------+--------------+ SFJ      Full                                                        +---------+---------------+---------+-----------+----------+--------------+ FV Prox  Full                                                        +---------+---------------+---------+-----------+----------+--------------+ FV Mid   Full                                                        +---------+---------------+---------+-----------+----------+--------------+  FV DistalFull                                                        +---------+---------------+---------+-----------+----------+--------------+ PFV      Full                                                        +---------+---------------+---------+-----------+----------+--------------+ POP      Full           Yes      Yes                                 +---------+---------------+---------+-----------+----------+--------------+ PTV      Full                                                        +---------+---------------+---------+-----------+----------+--------------+ PERO     Full                                                        +---------+---------------+---------+-----------+----------+--------------+   +---------+---------------+---------+-----------+----------+--------------+ LEFT      CompressibilityPhasicitySpontaneityPropertiesThrombus Aging +---------+---------------+---------+-----------+----------+--------------+ CFV      Full           Yes      Yes                                 +---------+---------------+---------+-----------+----------+--------------+ SFJ      Full                                                        +---------+---------------+---------+-----------+----------+--------------+ FV Prox  Full                                                        +---------+---------------+---------+-----------+----------+--------------+ FV Mid   Full                                                        +---------+---------------+---------+-----------+----------+--------------+ FV DistalFull                                                        +---------+---------------+---------+-----------+----------+--------------+  PFV      Full                                                        +---------+---------------+---------+-----------+----------+--------------+ POP      Full           Yes      Yes                                 +---------+---------------+---------+-----------+----------+--------------+ PTV      Full                                                        +---------+---------------+---------+-----------+----------+--------------+ PERO     Full                                                        +---------+---------------+---------+-----------+----------+--------------+     Summary: RIGHT: - There is no evidence of deep vein thrombosis in the lower extremity.  - No cystic structure found in the popliteal fossa.  LEFT: - There is no evidence of deep vein thrombosis in the lower extremity.  - No cystic structure found in the popliteal fossa.  *See table(s) above for measurements and observations. Electronically signed by Deitra Mayo MD on 11/20/2020 at 4:10:40 PM.    Final        Subjective: Denies chest pain. Breathing well he is asking for oxygen to go home. His oxygen sat on ambulation were normal.   Discharge Exam: Vitals:   12/19/20 0000 12/19/20 0419  BP: 118/64 121/64  Pulse: 90 80  Resp: 18 18  Temp: 99.4 F (37.4 C) 99.5 F (37.5 C)  SpO2: 94% 97%     General: Pt is alert, awake, not in acute distress Cardiovascular: RRR, S1/S2 +, no rubs, no gallops Respiratory: CTA bilaterally, no wheezing, no rhonchi Abdominal: Soft, NT, ND, bowel sounds + Extremities: no edema, no cyanosis    The results of significant diagnostics from this hospitalization (including imaging, microbiology, ancillary and laboratory) are listed below for reference.     Microbiology: Recent Results (from the past 240 hour(s))  Resp Panel by RT-PCR (Flu A&B, Covid) Nasopharyngeal Swab     Status: None   Collection Time: 12/17/20  8:00 PM   Specimen: Nasopharyngeal Swab; Nasopharyngeal(NP) swabs in vial transport medium  Result Value Ref Range Status   SARS Coronavirus 2 by RT PCR NEGATIVE NEGATIVE Final    Comment: (NOTE) SARS-CoV-2 target nucleic acids are NOT DETECTED.  The SARS-CoV-2 RNA is generally detectable in upper respiratory specimens during the acute phase of infection. The lowest concentration of SARS-CoV-2 viral copies this assay can detect is 138 copies/mL. A negative result does not preclude SARS-Cov-2 infection and should not be used as the sole basis for treatment or other patient management decisions. A negative result may occur with  improper specimen collection/handling, submission of specimen other than nasopharyngeal swab, presence of viral  mutation(s) within the areas targeted by this assay, and inadequate number of viral copies(<138 copies/mL). A negative result must be combined with clinical observations, patient history, and epidemiological information. The expected result is Negative.  Fact Sheet for Patients:   EntrepreneurPulse.com.au  Fact Sheet for Healthcare Providers:  IncredibleEmployment.be  This test is no t yet approved or cleared by the [place] FDA and  has been authorized for detection and/or diagnosis of SARS-CoV-2 by FDA under an Emergency Use Authorization (EUA). This EUA will remain  in effect (meaning this test can be used) for the duration of the COVID-19 declaration under Section 564(b)(1) of the Act, 21 U.S.C.section 360bbb-3(b)(1), unless the authorization is terminated  or revoked sooner.       Influenza A by PCR NEGATIVE NEGATIVE Final   Influenza B by PCR NEGATIVE NEGATIVE Final    Comment: (NOTE) The Xpert Xpress SARS-CoV-2/FLU/RSV plus assay is intended as an aid in the diagnosis of influenza from Nasopharyngeal swab specimens and should not be used as a sole basis for treatment. Nasal washings and aspirates are unacceptable for Xpert Xpress SARS-CoV-2/FLU/RSV testing.  Fact Sheet for Patients: EntrepreneurPulse.com.au  Fact Sheet for Healthcare Providers: IncredibleEmployment.be  This test is not yet approved or cleared by the [place] FDA and has been authorized for detection and/or diagnosis of SARS-CoV-2 by FDA under an Emergency Use Authorization (EUA). This EUA will remain in effect (meaning this test can be used) for the duration of the COVID-19 declaration under Section 564(b)(1) of the Act, 21 U.S.C. section 360bbb-3(b)(1), unless the authorization is terminated or revoked.  Performed at [facility], [place] [address], [place], [place] [address]   MRSA PCR Screening     Status: None   Collection Time: 12/18/20  6:11 AM   Specimen: Nasopharyngeal  Result Value Ref Range Status   MRSA by PCR NEGATIVE NEGATIVE Final    Comment:        The GeneXpert MRSA Assay (FDA approved for NASAL specimens only), is one component of a comprehensive MRSA  colonization surveillance program. It is not intended to diagnose MRSA infection nor to guide or monitor treatment for MRSA infections. Performed at [facility], [place] [address] [place], [place] [address]      Labs: BNP (last 3 results) Recent Labs    11/27/20 0937 12/17/20 1828  BNP 1,733.7* [id number]*   Basic Metabolic Panel: Recent Labs  Lab 12/17/20 1743 12/18/20 0245 12/19/20 0219  NA 140 140 136  K 4.6 4.9 4.2  CL 97* 99 96*  CO2 '29 25 26  '$ GLUCOSE 89 82 129*  BUN 52* 58* 38*  CREATININE 11.23* 12.34* 8.49*  CALCIUM 9.2 9.0 8.4*  MG  --   --  1.9  PHOS  --  5.7* 4.2   Liver Function Tests: Recent Labs  Lab 12/18/20 0245 12/19/20 0219  ALBUMIN 3.1* 2.9*   No results for input(s): LIPASE, AMYLASE in the last 168 hours. No results for input(s): AMMONIA in the last 168 hours. CBC: Recent Labs  Lab 12/17/20 1743 12/18/20 0245  WBC 9.2 9.1  HGB 9.6* 9.2*  HCT 29.8* 27.3*  MCV 99.3 97.2  PLT 254 251   Cardiac Enzymes: No results for input(s): CKTOTAL, CKMB, CKMBINDEX, TROPONINI in the last 168 hours. BNP: Invalid input(s): POCBNP CBG: No results for input(s): GLUCAP in the last 168 hours. D-Dimer No results for input(s): DDIMER in the last 72 hours. Hgb A1c No results for input(s): HGBA1C in the  last 72 hours. Lipid Profile No results for input(s): CHOL, HDL, LDLCALC, TRIG, CHOLHDL, LDLDIRECT in the last 72 hours. Thyroid function studies No results for input(s): TSH, T4TOTAL, T3FREE, THYROIDAB in the last 72 hours.  Invalid input(s): FREET3 Anemia work up No results for input(s): VITAMINB12, FOLATE, FERRITIN, TIBC, IRON, RETICCTPCT in the last 72 hours. Urinalysis    Component Value Date/Time   COLORURINE YELLOW 05/12/2016 0834   APPEARANCEUR CLOUDY (A) 05/12/2016 0834   LABSPEC 1.014 05/12/2016 0834   PHURINE 5.0 05/12/2016 0834   GLUCOSEU NEGATIVE 05/12/2016 0834   HGBUR MODERATE (A) 05/12/2016 0834   BILIRUBINUR NEGATIVE  05/12/2016 0834   KETONESUR NEGATIVE 05/12/2016 0834   PROTEINUR 100 (A) 05/12/2016 0834   UROBILINOGEN 0.2 12/09/2013 0013   NITRITE NEGATIVE 05/12/2016 0834   LEUKOCYTESUR NEGATIVE 05/12/2016 0834   Sepsis Labs Invalid input(s): PROCALCITONIN,  WBC,  LACTICIDVEN Microbiology Recent Results (from the past 240 hour(s))  Resp Panel by RT-PCR (Flu A&B, Covid) Nasopharyngeal Swab     Status: None   Collection Time: 12/17/20  8:00 PM   Specimen: Nasopharyngeal Swab; Nasopharyngeal(NP) swabs in vial transport medium  Result Value Ref Range Status   SARS Coronavirus 2 by RT PCR NEGATIVE NEGATIVE Final    Comment: (NOTE) SARS-CoV-2 target nucleic acids are NOT DETECTED.  The SARS-CoV-2 RNA is generally detectable in upper respiratory specimens during the acute phase of infection. The lowest concentration of SARS-CoV-2 viral copies this assay can detect is 138 copies/mL. A negative result does not preclude SARS-Cov-2 infection and should not be used as the sole basis for treatment or other patient management decisions. A negative result may occur with  improper specimen collection/handling, submission of specimen other than nasopharyngeal swab, presence of viral mutation(s) within the areas targeted by this assay, and inadequate number of viral copies(<138 copies/mL). A negative result must be combined with clinical observations, patient history, and epidemiological information. The expected result is Negative.  Fact Sheet for Patients:  EntrepreneurPulse.com.au  Fact Sheet for Healthcare Providers:  IncredibleEmployment.be  This test is no t yet approved or cleared by the [place] FDA and  has been authorized for detection and/or diagnosis of SARS-CoV-2 by FDA under an Emergency Use Authorization (EUA). This EUA will remain  in effect (meaning this test can be used) for the duration of the COVID-19 declaration under Section 564(b)(1) of the  Act, 21 U.S.C.section 360bbb-3(b)(1), unless the authorization is terminated  or revoked sooner.       Influenza A by PCR NEGATIVE NEGATIVE Final   Influenza B by PCR NEGATIVE NEGATIVE Final    Comment: (NOTE) The Xpert Xpress SARS-CoV-2/FLU/RSV plus assay is intended as an aid in the diagnosis of influenza from Nasopharyngeal swab specimens and should not be used as a sole basis for treatment. Nasal washings and aspirates are unacceptable for Xpert Xpress SARS-CoV-2/FLU/RSV testing.  Fact Sheet for Patients: EntrepreneurPulse.com.au  Fact Sheet for Healthcare Providers: IncredibleEmployment.be  This test is not yet approved or cleared by the [place] FDA and has been authorized for detection and/or diagnosis of SARS-CoV-2 by FDA under an Emergency Use Authorization (EUA). This EUA will remain in effect (meaning this test can be used) for the duration of the COVID-19 declaration under Section 564(b)(1) of the Act, 21 U.S.C. section 360bbb-3(b)(1), unless the authorization is terminated or revoked.  Performed at [facility], [place] [address], [place], [place] [address]   MRSA PCR Screening     Status: None   Collection Time:  12/18/20  6:11 AM   Specimen: Nasopharyngeal  Result Value Ref Range Status   MRSA by PCR NEGATIVE NEGATIVE Final    Comment:        The GeneXpert MRSA Assay (FDA approved for NASAL specimens only), is one component of a comprehensive MRSA colonization surveillance program. It is not intended to diagnose MRSA infection nor to guide or monitor treatment for MRSA infections. Performed at [facility], [place] [address] [place], [place] [address]      Time coordinating discharge: 40 minutes  SIGNED:   Elmarie Shiley, MD  [facility]

## 2020-12-19 NOTE — TOC Transition Note (Signed)
Transition of Care [facility]) - CM/SW Discharge Note   Patient Details  Name: Kenneth [name] MRN: [id number] Date of Birth: [birth year]  Transition [facility]) CM/SW Contact:  Trula Ore, [place] Phone Number: 12/19/2020, 12:04 PM   Clinical Narrative:     Patient will DC to: [address] [place] [address]  Anticipated DC date: 12/19/2020  Family notified: Jacob Moores  Transport by: [employer]  ?  Per MD patient ready for DC to . RN, patient, patient's family, notified of DC. Transport requested for patient.  CSW signing off.          Patient Goals and CMS Choice        Discharge Placement                       Discharge Plan and Services   Discharge Planning Services: [facility] Program Post Acute Care Choice: Resumption of Svcs/PTA Provider                    HH Arranged: RN,Disease Management   Date [place]: 12/19/20 Time [place]: 1004 Representative spoke with at [place]: [place] ([place]) Interventions     Readmission Risk Interventions Readmission Risk Prevention Plan 11/28/2020 10/29/2020 10/04/2020  Transportation Screening Complete Complete Complete  Medication Review Press photographer) Complete Complete Complete  PCP or Specialist appointment within 3-5 days of discharge - - Complete  HRI or Home Care Consult Complete Complete Complete  SW Recovery Care/Counseling Consult Complete Complete Complete  Palliative Care Screening Not Applicable Not Applicable Complete  [place] Not Applicable Not Applicable Not Applicable  Some recent data might be hidden

## 2020-12-19 NOTE — Progress Notes (Addendum)
[facility] Progress Note   Subjective:   Patient seen and examined at bedside in room.  Shortness of breath and CP improved.  Requesting O2 for at home due to issues with SOB that typically occurs on his weekend (Sat>Tues) with dialysis.  Discussed fluid restrictions and ways to help limit fluids.    Objective Vitals:   12/18/20 2058 12/19/20 0000 12/19/20 0419 12/19/20 0943  BP: 133/79 118/64 121/64 (!) 117/97  Pulse: 88 90 80 90  Resp: '20 18 18   '$ Temp: 97.8 F (36.6 C) 99.4 F (37.4 C) 99.5 F (37.5 C)   TempSrc: Oral Oral Oral   SpO2: 100% 94% 97%   Weight: 85 kg     Height: '5\' 11"'$  (1.803 m)      Physical Exam General:well developed male in NAD Heart:RRR, no mrg Lungs:CTAB, nml WOB on RA Abdomen:soft, NTND Extremities:no LE edema Dialysis Access: LU AVF +b/t   Filed Weights   12/17/20 1828 12/18/20 0700 12/18/20 2058  Weight: 88 kg 88.5 kg 85 kg    Intake/Output Summary (Last 24 hours) at 12/19/2020 1055 Last data filed at 12/19/2020 [id number] Gross per 24 hour  Intake 480 ml  Output 3131 ml  Net -2651 ml    Additional Objective Labs: Basic Metabolic Panel: Recent Labs  Lab 12/17/20 1743 12/18/20 0245 12/19/20 0219  NA 140 140 136  K 4.6 4.9 4.2  CL 97* 99 96*  CO2 '29 25 26  '$ GLUCOSE 89 82 129*  BUN 52* 58* 38*  CREATININE 11.23* 12.34* 8.49*  CALCIUM 9.2 9.0 8.4*  PHOS  --  5.7* 4.2   Liver Function Tests: Recent Labs  Lab 12/18/20 0245 12/19/20 0219  ALBUMIN 3.1* 2.9*   CBC: Recent Labs  Lab 12/17/20 1743 12/18/20 0245  WBC 9.2 9.1  HGB 9.6* 9.2*  HCT 29.8* 27.3*  MCV 99.3 97.2  PLT 254 251   Studies/Results: DG Chest 2 View  Result Date: 12/17/2020 CLINICAL DATA:  Shortness of breath EXAM: CHEST - 2 VIEW COMPARISON:  11/27/2020 FINDINGS: Cardiomegaly. Mild, diffuse bilateral interstitial pulmonary opacity. The visualized skeletal structures are unremarkable. IMPRESSION: Cardiomegaly with mild, diffuse bilateral  interstitial pulmonary opacity, most consistent with mild edema. No focal airspace opacity. Electronically Signed   By: Eddie Candle M.D.   On: 12/17/2020 19:18    Medications:  . amLODipine  5 mg Oral Daily  . atorvastatin  80 mg Oral QHS  . Chlorhexidine Gluconate Cloth  6 each Topical Q0600  . clopidogrel  75 mg Oral QPM  . heparin  5,000 Units Subcutaneous Q8H  . isosorbide mononitrate  90 mg Oral Daily  . lanthanum  1,000 mg Oral TID with meals  . metoprolol succinate  25 mg Oral Daily  . pantoprazole  40 mg Oral QAC breakfast    Dialysis Orders: Center:[facility]. TueThuSat, 4 hrs 0 min, 180NRe Optiflux, BFR 400, DFR Manual 800 mL/min, EDW 82.5 (kg), Dialysate 2.0 K, 2.0 Ca Heparin 2400 unit bolus sensipar 30 mg q HD mircera 75 mcg q 2 weeks- last dose 1/13 Calcitriol 2.5 mcg PO q HD  Assessment/Plan: 1. Volume overload:due to non compliance with OP dialysis, frequently shortened treatments and large intra dialyetic weight gains.Not meeting OP dry weight.  CXR consistent with volume overload.  Improved post HD. Net UF 3.1L removed yesterday.  Still not to EDW but does not appear grossly volume overloaded at this time.  Counseled on fluid restrictions.  2. ESRD:Dialyzes TTS. K 4.2.  Next HD  tomorrow. If discharged can be completed at OP center.  Will write orders for here in case remains admitted.  3. Hypertension:BP well controlled.  Continue home meds.  4. Anemia: Last Hgb 9.2.  Due for ESA.  Will order with HD on Thursday if still admitted, if not will give at OP HD.   5. Metabolic bone disease:Calcium and phos in goal.  Resume VDRA, sensipar and binders. 6. Nutrition:Renal diet with fluid restrictions.   7. CP/Severe multivessel [id number] hospitalizations for the same. Cardiology recommended medical management, not a candidate for CABG or PCI. No chest pain at present. 8. Chronic combined systolic and diastolic HF  9. A fib - on metoprolol.  Not on  anticoagulation. 10. Dispo - ok for d/c from renal standpoint.     Jen Mow, PA-C [facility] 12/19/2020,10:55 AM  LOS: 1 day

## 2020-12-19 NOTE — Progress Notes (Signed)
SATURATION QUALIFICATIONS: (This note is used to comply with regulatory documentation for home oxygen)  Patient Saturations on Room Air at Rest = 96%  Patient Saturations on Room Air while Ambulating = 92%  Patient Saturations on N/A Liters of oxygen while Ambulating = N/A  Please briefly explain why patient needs home oxygen:  DOES NOT QUALIFY

## 2020-12-19 NOTE — Discharge Instructions (Signed)
Food Basics for Chronic Kidney Disease Chronic kidney disease (CKD) occurs when the kidneys are permanently damaged over a long period of time. When your kidneys are not working well, they cannot remove waste, fluids, and other substances from your blood as well as they did before. The substances can build up, which can worsen kidney damage and affect how your body functions. Certain foods lead to a buildup of these substances. By changing your diet, you can help prevent more kidney damage and delay or prevent the need for dialysis. What are tips for following this plan? Reading food labels  Check the amount of salt (sodium) in foods. Choose foods that have less than 300 milligrams (mg) per serving.  Check the ingredient list for phosphorus or potassium-based additives or preservatives.  Check the amount of saturated fat and trans fat. Limit or avoid these fats as told by your dietitian. Shopping  Avoid buying foods that are: ? Processed or prepackaged. ? Calcium-enriched or that have calcium added to them (are fortified).  Do not buy foods that have salt or sodium listed among the first five ingredients.  Buy canned vegetables and beans that say "no salt added" or "low sodium" and rinse them before eating. Cooking  Soak vegetables, such as potatoes, before cooking to reduce potassium. To do this: 1. Peel and cut the vegetables into small pieces. 2. Soak the vegetables in warm water for at least 2 hours. For every 1 cup of vegetables, use 10 cups of water. 3. Drain and rinse the vegetables with warm water. 4. Boil the vegetables for at least 5 minutes. Meal planning  Limit the amount of protein you eat from plant and animal sources each day.  Do not add salt to food when cooking or before eating.  Eat meals and snacks at around the same time each day. General information  Talk with your health care provider about whether you should take a vitamin and mineral supplement.  Use  standard measuring cups and spoons to measure servings of foods. Use a kitchen scale to measure portions of protein foods.  If told by your health care provider, avoid drinking too much fluid. Measure and count all liquids, including water, ice, soups, flavored gelatin, and frozen desserts such as ice pops or ice cream. If you have diabetes:  If you have diabetes (diabetes mellitus) and CKD, it is important to keep your blood sugar (glucose) in the target range recommended by your health care provider. Follow your diabetes management plan. This may include: ? Checking your blood glucose regularly. ? Taking medicines by mouth, taking insulin, or taking both. ? Exercising for at least 30 minutes on 5 or more days each week, or as told by your health care provider. ? Tracking how many servings of carbohydrates you eat at each meal.  You may be given specific guidelines on how much of certain foods and nutrients you may eat, depending on your stage of kidney disease and whether you have high blood pressure (hypertension). Follow your meal plan as told by your dietitian. What nutrients should I limit? Work with your health care provider and dietitian to develop a meal plan that is right for you. Foods you can eat and foods you should limit or avoid will depend on the stage of your kidney disease and any other health conditions you have. The items listed below are not a complete list. Talk with your dietitian about what dietary choices are best for you. Potassium Potassium affects how steadily   your heart beats. If too much potassium builds up in your blood, the potassium can cause an irregular heartbeat or even a heart attack. You may need to limit or avoid foods that are high in potassium, such as:  Milk and soy milk.  Fruits, such as bananas, apricots, nectarines, melon, prunes, raisins, kiwi, and oranges.  Vegetables, such as potatoes, sweet potatoes, yams, tomatoes, leafy greens, beets, avocado,  pumpkin, and winter squash.  White and lima beans.  Whole-wheat breads and pastas.  Beans and nuts. Phosphorus Phosphorus is a mineral found in your bones. A balance between calcium and phosphorus is needed to build and maintain healthy bones. Too much phosphorus pulls calcium from your bones. This can make your bones weak and more likely to break. Too much phosphorus can also make your skin itch. You may need to limit or avoid foods that are high in phosphorus, such as:  Milk and dairy products.  Dried beans and peas.  Tofu, soy milk, and other soy-based meat replacements.  Dark-colored sodas.  Nuts and peanut butter.  Meat, poultry, and fish.  Bran cereals and oatmeal. Protein Protein helps you make and keep muscle. It also helps to repair your body's cells and tissues. One of the natural breakdown products of protein is a waste product called urea. When your kidneys are not working properly, they cannot remove wastes, such as urea. Reducing how much protein you eat can help prevent a buildup of urea in your blood. Depending on your stage of kidney disease, you may need to limit foods that are high in protein. Sources of animal protein include:  Meat (all types).  Fish and seafood.  Poultry.  Eggs.  Dairy. Other protein foods include:  Beans and legumes.  Nuts and nut butter.  Soy and tofu.   Sodium Sodium helps to maintain a healthy balance of fluids in your body. Too much sodium can increase your blood pressure and have a negative effect on your heart and lungs. Too much sodium can also cause your body to retain too much fluid, making your kidneys work harder. Most people should have less than 2,300 mg of sodium each day. If you have hypertension, you may need to limit your sodium to 1,500 mg each day. You may need to limit or avoid foods that are high in sodium, such as:  Salt seasonings.  Soy sauce.  Cured and processed meats.  Salted crackers and snack  foods.  Fast food.  Canned soups and most canned foods.  Pickled foods.  Vegetable juice.  Boxed mixes or ready-to-eat boxed meals and side dishes.  Bottled dressings, sauces, and marinades. Talk with your dietitian about how much potassium, phosphorus, protein, and sodium you may have each day. Summary  Chronic kidney disease (CKD) can lead to a buildup of waste and extra substances in the body. Certain foods lead to a buildup of these substances. By changing your diet as told, you can help prevent more kidney damage and delay or prevent the need for dialysis.  Food intake changes are different for each person with CKD. Work with a dietitian to set up nutrient goals and a meal plan that is right for you.  If you have diabetes and CKD, it is important to keep your blood sugar in the target range recommended by your health care provider. This information is not intended to replace advice given to you by your health care provider. Make sure you discuss any questions you have with your health care   provider. Document Revised: 02/06/2020 Document Reviewed: 02/06/2020 Elsevier Patient Education  2021 Elsevier Inc.  

## 2020-12-19 NOTE — TOC Transition Note (Signed)
Transition of Care [facility]) - CM/SW Discharge Note   Patient Details  Name: Kenneth [name] MRN: [id number] Date of Birth: 06-18-1956  [facility]) CM/SW Contact:  Ninfa Meeker, RN Phone Number: 12/19/2020, 10:04 AM   Clinical Narrative:   Case manager received notification that patient has no Drug coverage. MATCH completed. Patient was active with [facility] prior to admission. [facility] Liaison notified of discharge plan and need for resumption of services.          Patient Goals and CMS Choice        Discharge Placement                       Discharge Plan and Services   Discharge Planning Services: [facility] Program Post Acute Care Choice: Resumption of Svcs/PTA Provider                    HH Arranged: RN,Disease Management   Date [place]: 12/19/20 Time [place]: 1004 Representative spoke with at [place]: [place] ([place]) Interventions     Readmission Risk Interventions Readmission Risk Prevention Plan 11/28/2020 10/29/2020 10/04/2020  Transportation Screening Complete Complete Complete  Medication Review Press photographer) Complete Complete Complete  PCP or Specialist appointment within 3-5 days of discharge - - Complete  HRI or Home Care Consult Complete Complete Complete  SW Recovery Care/Counseling Consult Complete Complete Complete  Palliative Care Screening Not Applicable Not Applicable Complete  [place] Not Applicable Not Applicable Not Applicable  Some recent data might be hidden

## 2020-12-20 ENCOUNTER — Telehealth: Payer: Self-pay | Admitting: Family Medicine

## 2020-12-20 ENCOUNTER — Telehealth: Payer: Self-pay

## 2020-12-20 DIAGNOSIS — N2581 Secondary hyperparathyroidism of renal origin: Secondary | ICD-10-CM | POA: Diagnosis not present

## 2020-12-20 DIAGNOSIS — N186 End stage renal disease: Secondary | ICD-10-CM | POA: Diagnosis not present

## 2020-12-20 DIAGNOSIS — D631 Anemia in chronic kidney disease: Secondary | ICD-10-CM | POA: Diagnosis not present

## 2020-12-20 DIAGNOSIS — E8779 Other fluid overload: Secondary | ICD-10-CM | POA: Diagnosis not present

## 2020-12-20 DIAGNOSIS — Z992 Dependence on renal dialysis: Secondary | ICD-10-CM | POA: Diagnosis not present

## 2020-12-20 NOTE — Telephone Encounter (Signed)
Home Health Certification and Plan of Care forms signed and faxed to [facility], 12/20/20.

## 2020-12-20 NOTE — Telephone Encounter (Signed)
Transition Care Management Unsuccessful Follow-up Telephone Call  Date of discharge and from where:  12/19/2020 from [facility]   Attempts:  1st Attempt  Reason for unsuccessful TCM follow-up call:  Left voice message

## 2020-12-21 DIAGNOSIS — I132 Hypertensive heart and chronic kidney disease with heart failure and with stage 5 chronic kidney disease, or end stage renal disease: Secondary | ICD-10-CM | POA: Diagnosis not present

## 2020-12-21 DIAGNOSIS — I083 Combined rheumatic disorders of mitral, aortic and tricuspid valves: Secondary | ICD-10-CM | POA: Diagnosis not present

## 2020-12-21 DIAGNOSIS — I5043 Acute on chronic combined systolic (congestive) and diastolic (congestive) heart failure: Secondary | ICD-10-CM | POA: Diagnosis not present

## 2020-12-21 DIAGNOSIS — N186 End stage renal disease: Secondary | ICD-10-CM | POA: Diagnosis not present

## 2020-12-21 DIAGNOSIS — I0981 Rheumatic heart failure: Secondary | ICD-10-CM | POA: Diagnosis not present

## 2020-12-21 DIAGNOSIS — J9601 Acute respiratory failure with hypoxia: Secondary | ICD-10-CM | POA: Diagnosis not present

## 2020-12-21 NOTE — Telephone Encounter (Signed)
Transition Care Management Unsuccessful Follow-up Telephone Call  Date of discharge and from where:  12/19/2020 from [facility]   Attempts:  2nd Attempt  Reason for unsuccessful TCM follow-up call:  Left voice message

## 2020-12-22 DIAGNOSIS — Z992 Dependence on renal dialysis: Secondary | ICD-10-CM | POA: Diagnosis not present

## 2020-12-22 DIAGNOSIS — N186 End stage renal disease: Secondary | ICD-10-CM | POA: Diagnosis not present

## 2020-12-22 DIAGNOSIS — D631 Anemia in chronic kidney disease: Secondary | ICD-10-CM | POA: Diagnosis not present

## 2020-12-22 DIAGNOSIS — E8779 Other fluid overload: Secondary | ICD-10-CM | POA: Diagnosis not present

## 2020-12-22 DIAGNOSIS — N2581 Secondary hyperparathyroidism of renal origin: Secondary | ICD-10-CM | POA: Diagnosis not present

## 2020-12-24 DIAGNOSIS — N186 End stage renal disease: Secondary | ICD-10-CM | POA: Diagnosis not present

## 2020-12-24 DIAGNOSIS — D631 Anemia in chronic kidney disease: Secondary | ICD-10-CM | POA: Diagnosis not present

## 2020-12-24 DIAGNOSIS — Z992 Dependence on renal dialysis: Secondary | ICD-10-CM | POA: Diagnosis not present

## 2020-12-24 DIAGNOSIS — N2581 Secondary hyperparathyroidism of renal origin: Secondary | ICD-10-CM | POA: Diagnosis not present

## 2020-12-24 DIAGNOSIS — E8779 Other fluid overload: Secondary | ICD-10-CM | POA: Diagnosis not present

## 2020-12-25 DIAGNOSIS — N186 End stage renal disease: Secondary | ICD-10-CM | POA: Diagnosis not present

## 2020-12-25 DIAGNOSIS — Z992 Dependence on renal dialysis: Secondary | ICD-10-CM | POA: Diagnosis not present

## 2020-12-25 DIAGNOSIS — D631 Anemia in chronic kidney disease: Secondary | ICD-10-CM | POA: Diagnosis not present

## 2020-12-25 DIAGNOSIS — I129 Hypertensive chronic kidney disease with stage 1 through stage 4 chronic kidney disease, or unspecified chronic kidney disease: Secondary | ICD-10-CM | POA: Diagnosis not present

## 2020-12-25 DIAGNOSIS — N2581 Secondary hyperparathyroidism of renal origin: Secondary | ICD-10-CM | POA: Diagnosis not present

## 2020-12-27 DIAGNOSIS — N2581 Secondary hyperparathyroidism of renal origin: Secondary | ICD-10-CM | POA: Diagnosis not present

## 2020-12-27 DIAGNOSIS — N186 End stage renal disease: Secondary | ICD-10-CM | POA: Diagnosis not present

## 2020-12-27 DIAGNOSIS — D631 Anemia in chronic kidney disease: Secondary | ICD-10-CM | POA: Diagnosis not present

## 2020-12-27 DIAGNOSIS — Z992 Dependence on renal dialysis: Secondary | ICD-10-CM | POA: Diagnosis not present

## 2020-12-28 DIAGNOSIS — J9601 Acute respiratory failure with hypoxia: Secondary | ICD-10-CM | POA: Diagnosis not present

## 2020-12-28 DIAGNOSIS — I083 Combined rheumatic disorders of mitral, aortic and tricuspid valves: Secondary | ICD-10-CM | POA: Diagnosis not present

## 2020-12-28 DIAGNOSIS — I132 Hypertensive heart and chronic kidney disease with heart failure and with stage 5 chronic kidney disease, or end stage renal disease: Secondary | ICD-10-CM | POA: Diagnosis not present

## 2020-12-28 DIAGNOSIS — I0981 Rheumatic heart failure: Secondary | ICD-10-CM | POA: Diagnosis not present

## 2020-12-28 DIAGNOSIS — N186 End stage renal disease: Secondary | ICD-10-CM | POA: Diagnosis not present

## 2020-12-28 DIAGNOSIS — I5043 Acute on chronic combined systolic (congestive) and diastolic (congestive) heart failure: Secondary | ICD-10-CM | POA: Diagnosis not present

## 2020-12-29 DIAGNOSIS — D631 Anemia in chronic kidney disease: Secondary | ICD-10-CM | POA: Diagnosis not present

## 2020-12-29 DIAGNOSIS — N2581 Secondary hyperparathyroidism of renal origin: Secondary | ICD-10-CM | POA: Diagnosis not present

## 2020-12-29 DIAGNOSIS — Z992 Dependence on renal dialysis: Secondary | ICD-10-CM | POA: Diagnosis not present

## 2020-12-29 DIAGNOSIS — N186 End stage renal disease: Secondary | ICD-10-CM | POA: Diagnosis not present

## 2020-12-31 DIAGNOSIS — J9601 Acute respiratory failure with hypoxia: Secondary | ICD-10-CM | POA: Diagnosis not present

## 2020-12-31 DIAGNOSIS — I132 Hypertensive heart and chronic kidney disease with heart failure and with stage 5 chronic kidney disease, or end stage renal disease: Secondary | ICD-10-CM | POA: Diagnosis not present

## 2020-12-31 DIAGNOSIS — N186 End stage renal disease: Secondary | ICD-10-CM | POA: Diagnosis not present

## 2020-12-31 DIAGNOSIS — I083 Combined rheumatic disorders of mitral, aortic and tricuspid valves: Secondary | ICD-10-CM | POA: Diagnosis not present

## 2020-12-31 DIAGNOSIS — I0981 Rheumatic heart failure: Secondary | ICD-10-CM | POA: Diagnosis not present

## 2020-12-31 DIAGNOSIS — I5043 Acute on chronic combined systolic (congestive) and diastolic (congestive) heart failure: Secondary | ICD-10-CM | POA: Diagnosis not present

## 2021-01-01 DIAGNOSIS — N186 End stage renal disease: Secondary | ICD-10-CM | POA: Diagnosis not present

## 2021-01-01 DIAGNOSIS — N2581 Secondary hyperparathyroidism of renal origin: Secondary | ICD-10-CM | POA: Diagnosis not present

## 2021-01-01 DIAGNOSIS — D631 Anemia in chronic kidney disease: Secondary | ICD-10-CM | POA: Diagnosis not present

## 2021-01-01 DIAGNOSIS — Z992 Dependence on renal dialysis: Secondary | ICD-10-CM | POA: Diagnosis not present

## 2021-01-03 DIAGNOSIS — Z992 Dependence on renal dialysis: Secondary | ICD-10-CM | POA: Diagnosis not present

## 2021-01-03 DIAGNOSIS — N2581 Secondary hyperparathyroidism of renal origin: Secondary | ICD-10-CM | POA: Diagnosis not present

## 2021-01-03 DIAGNOSIS — D631 Anemia in chronic kidney disease: Secondary | ICD-10-CM | POA: Diagnosis not present

## 2021-01-03 DIAGNOSIS — N186 End stage renal disease: Secondary | ICD-10-CM | POA: Diagnosis not present

## 2021-01-05 DIAGNOSIS — N2581 Secondary hyperparathyroidism of renal origin: Secondary | ICD-10-CM | POA: Diagnosis not present

## 2021-01-05 DIAGNOSIS — D631 Anemia in chronic kidney disease: Secondary | ICD-10-CM | POA: Diagnosis not present

## 2021-01-05 DIAGNOSIS — N186 End stage renal disease: Secondary | ICD-10-CM | POA: Diagnosis not present

## 2021-01-05 DIAGNOSIS — Z992 Dependence on renal dialysis: Secondary | ICD-10-CM | POA: Diagnosis not present

## 2021-01-06 DIAGNOSIS — Z87891 Personal history of nicotine dependence: Secondary | ICD-10-CM | POA: Diagnosis not present

## 2021-01-06 DIAGNOSIS — N186 End stage renal disease: Secondary | ICD-10-CM | POA: Diagnosis not present

## 2021-01-06 DIAGNOSIS — Z8701 Personal history of pneumonia (recurrent): Secondary | ICD-10-CM | POA: Diagnosis not present

## 2021-01-06 DIAGNOSIS — I480 Paroxysmal atrial fibrillation: Secondary | ICD-10-CM | POA: Diagnosis not present

## 2021-01-06 DIAGNOSIS — I083 Combined rheumatic disorders of mitral, aortic and tricuspid valves: Secondary | ICD-10-CM | POA: Diagnosis not present

## 2021-01-06 DIAGNOSIS — I0981 Rheumatic heart failure: Secondary | ICD-10-CM | POA: Diagnosis not present

## 2021-01-06 DIAGNOSIS — I5043 Acute on chronic combined systolic (congestive) and diastolic (congestive) heart failure: Secondary | ICD-10-CM | POA: Diagnosis not present

## 2021-01-06 DIAGNOSIS — I132 Hypertensive heart and chronic kidney disease with heart failure and with stage 5 chronic kidney disease, or end stage renal disease: Secondary | ICD-10-CM | POA: Diagnosis not present

## 2021-01-06 DIAGNOSIS — Z7902 Long term (current) use of antithrombotics/antiplatelets: Secondary | ICD-10-CM | POA: Diagnosis not present

## 2021-01-06 DIAGNOSIS — I2510 Atherosclerotic heart disease of native coronary artery without angina pectoris: Secondary | ICD-10-CM | POA: Diagnosis not present

## 2021-01-06 DIAGNOSIS — Z9181 History of falling: Secondary | ICD-10-CM | POA: Diagnosis not present

## 2021-01-06 DIAGNOSIS — I252 Old myocardial infarction: Secondary | ICD-10-CM | POA: Diagnosis not present

## 2021-01-06 DIAGNOSIS — Z992 Dependence on renal dialysis: Secondary | ICD-10-CM | POA: Diagnosis not present

## 2021-01-06 DIAGNOSIS — Z7982 Long term (current) use of aspirin: Secondary | ICD-10-CM | POA: Diagnosis not present

## 2021-01-06 DIAGNOSIS — J9601 Acute respiratory failure with hypoxia: Secondary | ICD-10-CM | POA: Diagnosis not present

## 2021-01-06 DIAGNOSIS — I429 Cardiomyopathy, unspecified: Secondary | ICD-10-CM | POA: Diagnosis not present

## 2021-01-07 DIAGNOSIS — J9601 Acute respiratory failure with hypoxia: Secondary | ICD-10-CM | POA: Diagnosis not present

## 2021-01-07 DIAGNOSIS — I0981 Rheumatic heart failure: Secondary | ICD-10-CM | POA: Diagnosis not present

## 2021-01-07 DIAGNOSIS — I5043 Acute on chronic combined systolic (congestive) and diastolic (congestive) heart failure: Secondary | ICD-10-CM | POA: Diagnosis not present

## 2021-01-07 DIAGNOSIS — I083 Combined rheumatic disorders of mitral, aortic and tricuspid valves: Secondary | ICD-10-CM | POA: Diagnosis not present

## 2021-01-07 DIAGNOSIS — I132 Hypertensive heart and chronic kidney disease with heart failure and with stage 5 chronic kidney disease, or end stage renal disease: Secondary | ICD-10-CM | POA: Diagnosis not present

## 2021-01-07 DIAGNOSIS — N186 End stage renal disease: Secondary | ICD-10-CM | POA: Diagnosis not present

## 2021-01-08 DIAGNOSIS — N2581 Secondary hyperparathyroidism of renal origin: Secondary | ICD-10-CM | POA: Diagnosis not present

## 2021-01-08 DIAGNOSIS — N186 End stage renal disease: Secondary | ICD-10-CM | POA: Diagnosis not present

## 2021-01-08 DIAGNOSIS — Z992 Dependence on renal dialysis: Secondary | ICD-10-CM | POA: Diagnosis not present

## 2021-01-08 DIAGNOSIS — D631 Anemia in chronic kidney disease: Secondary | ICD-10-CM | POA: Diagnosis not present

## 2021-01-10 ENCOUNTER — Telehealth (HOSPITAL_COMMUNITY): Payer: Self-pay | Admitting: Pharmacist

## 2021-01-10 DIAGNOSIS — D631 Anemia in chronic kidney disease: Secondary | ICD-10-CM | POA: Diagnosis not present

## 2021-01-10 DIAGNOSIS — Z992 Dependence on renal dialysis: Secondary | ICD-10-CM | POA: Diagnosis not present

## 2021-01-10 DIAGNOSIS — N186 End stage renal disease: Secondary | ICD-10-CM | POA: Diagnosis not present

## 2021-01-10 DIAGNOSIS — N2581 Secondary hyperparathyroidism of renal origin: Secondary | ICD-10-CM | POA: Diagnosis not present

## 2021-01-10 NOTE — Telephone Encounter (Signed)
Transitions of Care Pharmacy   Call attempted for a pharmacy transitions of care follow-up. HIPAA appropriate voicemail was left with call back information provided.   Call attempt #1. Will follow-up next week

## 2021-01-12 DIAGNOSIS — Z992 Dependence on renal dialysis: Secondary | ICD-10-CM | POA: Diagnosis not present

## 2021-01-12 DIAGNOSIS — D631 Anemia in chronic kidney disease: Secondary | ICD-10-CM | POA: Diagnosis not present

## 2021-01-12 DIAGNOSIS — N186 End stage renal disease: Secondary | ICD-10-CM | POA: Diagnosis not present

## 2021-01-12 DIAGNOSIS — N2581 Secondary hyperparathyroidism of renal origin: Secondary | ICD-10-CM | POA: Diagnosis not present

## 2021-01-14 ENCOUNTER — Other Ambulatory Visit: Payer: Self-pay

## 2021-01-14 ENCOUNTER — Ambulatory Visit (INDEPENDENT_AMBULATORY_CARE_PROVIDER_SITE_OTHER): Payer: Medicare Other | Admitting: Podiatry

## 2021-01-14 DIAGNOSIS — M79674 Pain in right toe(s): Secondary | ICD-10-CM

## 2021-01-14 DIAGNOSIS — M79675 Pain in left toe(s): Secondary | ICD-10-CM | POA: Diagnosis not present

## 2021-01-14 DIAGNOSIS — B351 Tinea unguium: Secondary | ICD-10-CM | POA: Diagnosis not present

## 2021-01-14 NOTE — Progress Notes (Signed)
   SUBJECTIVE Patient presents to office today complaining of elongated, thickened nails that cause pain while ambulating in shoes.  He is unable to trim his own nails. Patient is here for further evaluation and treatment.  Past Medical History:  Diagnosis Date  . Acute CHF ([place]) 01/2018  . Cardiomyopathy secondary    likely related to HTN heart disease; possibly ETOH related as well  . Chronic combined systolic and diastolic heart failure (HCC)    Echocardiogram 09/22/11: Moderate LVH, EF 99991111, grade 3 diastolic dysfunction, mild MR, moderate to severe LAE, mild RVE, mild to moderate TR, small to moderate pericardial effusion  . Coronary artery disease    not felt to be candidate for CABG 08/2018; medical thearpy recomended due to high risk of PCI   . Dysrhythmia    aflutter 04/2016, afib 02/2018, not felt to be a candidate for anticoagulation due to non-compliance and ETOH  . ESRD (end stage renal disease) on dialysis ([place])    due to hypertensive nephrosclerosis; TTS; [address] (09/01/2018)  . History of alcohol abuse   . Hypertension   . Myocardial infarction [facility])    " mild " per daughter  . PEA (Pulseless electrical activity) ([place])    PEA arrest 03/09/18, treated empirically for hyperkalemia, shock x1 for WCT, given amiodarone, ROSC after 10 min of ACLS    OBJECTIVE General Patient is awake, alert, and oriented x 3 and in no acute distress. Derm Skin is dry and supple bilateral. Negative open lesions or macerations. Remaining integument unremarkable. Nails are tender, long, thickened and dystrophic with subungual debris, consistent with onychomycosis, 1-5 bilateral. No signs of infection noted. Vasc  DP and PT pedal pulses palpable bilaterally. Temperature gradient within normal limits.  Neuro Epicritic and protective threshold sensation grossly intact bilaterally.  Musculoskeletal Exam No symptomatic pedal deformities noted bilateral. Muscular strength within normal  limits.  ASSESSMENT 1. Onychodystrophic nails 1-5 bilateral with hyperkeratosis of nails.  2. Onychomycosis of nail due to dermatophyte bilateral 3. Pain in foot bilateral  PLAN OF CARE 1. Patient evaluated today.  2. Instructed to maintain good pedal hygiene and foot care.  3. Mechanical debridement of nails 1-5 bilaterally performed using a nail nipper. Filed with dremel without incident.  4. Return to clinic in 3 mos. for routine foot care   Edrick Kins, DPM [facility]  Dr. Edrick Kins, DPM    [address], [place] [address]                Office [phone]  Fax [phone]

## 2021-01-15 DIAGNOSIS — N186 End stage renal disease: Secondary | ICD-10-CM | POA: Diagnosis not present

## 2021-01-15 DIAGNOSIS — D631 Anemia in chronic kidney disease: Secondary | ICD-10-CM | POA: Diagnosis not present

## 2021-01-15 DIAGNOSIS — N2581 Secondary hyperparathyroidism of renal origin: Secondary | ICD-10-CM | POA: Diagnosis not present

## 2021-01-15 DIAGNOSIS — Z992 Dependence on renal dialysis: Secondary | ICD-10-CM | POA: Diagnosis not present

## 2021-01-17 DIAGNOSIS — D631 Anemia in chronic kidney disease: Secondary | ICD-10-CM | POA: Diagnosis not present

## 2021-01-17 DIAGNOSIS — N2581 Secondary hyperparathyroidism of renal origin: Secondary | ICD-10-CM | POA: Diagnosis not present

## 2021-01-17 DIAGNOSIS — N186 End stage renal disease: Secondary | ICD-10-CM | POA: Diagnosis not present

## 2021-01-17 DIAGNOSIS — Z992 Dependence on renal dialysis: Secondary | ICD-10-CM | POA: Diagnosis not present

## 2021-01-19 DIAGNOSIS — N2581 Secondary hyperparathyroidism of renal origin: Secondary | ICD-10-CM | POA: Diagnosis not present

## 2021-01-19 DIAGNOSIS — D631 Anemia in chronic kidney disease: Secondary | ICD-10-CM | POA: Diagnosis not present

## 2021-01-19 DIAGNOSIS — Z992 Dependence on renal dialysis: Secondary | ICD-10-CM | POA: Diagnosis not present

## 2021-01-19 DIAGNOSIS — N186 End stage renal disease: Secondary | ICD-10-CM | POA: Diagnosis not present

## 2021-01-22 DIAGNOSIS — N186 End stage renal disease: Secondary | ICD-10-CM | POA: Diagnosis not present

## 2021-01-22 DIAGNOSIS — N2581 Secondary hyperparathyroidism of renal origin: Secondary | ICD-10-CM | POA: Diagnosis not present

## 2021-01-22 DIAGNOSIS — D631 Anemia in chronic kidney disease: Secondary | ICD-10-CM | POA: Diagnosis not present

## 2021-01-22 DIAGNOSIS — Z992 Dependence on renal dialysis: Secondary | ICD-10-CM | POA: Diagnosis not present

## 2021-01-24 DIAGNOSIS — N2581 Secondary hyperparathyroidism of renal origin: Secondary | ICD-10-CM | POA: Diagnosis not present

## 2021-01-24 DIAGNOSIS — D631 Anemia in chronic kidney disease: Secondary | ICD-10-CM | POA: Diagnosis not present

## 2021-01-24 DIAGNOSIS — N186 End stage renal disease: Secondary | ICD-10-CM | POA: Diagnosis not present

## 2021-01-24 DIAGNOSIS — Z992 Dependence on renal dialysis: Secondary | ICD-10-CM | POA: Diagnosis not present

## 2021-01-25 DIAGNOSIS — N186 End stage renal disease: Secondary | ICD-10-CM | POA: Diagnosis not present

## 2021-01-25 DIAGNOSIS — Z992 Dependence on renal dialysis: Secondary | ICD-10-CM | POA: Diagnosis not present

## 2021-01-25 DIAGNOSIS — I129 Hypertensive chronic kidney disease with stage 1 through stage 4 chronic kidney disease, or unspecified chronic kidney disease: Secondary | ICD-10-CM | POA: Diagnosis not present

## 2021-01-26 DIAGNOSIS — Z992 Dependence on renal dialysis: Secondary | ICD-10-CM | POA: Diagnosis not present

## 2021-01-26 DIAGNOSIS — N2581 Secondary hyperparathyroidism of renal origin: Secondary | ICD-10-CM | POA: Diagnosis not present

## 2021-01-26 DIAGNOSIS — D509 Iron deficiency anemia, unspecified: Secondary | ICD-10-CM | POA: Diagnosis not present

## 2021-01-26 DIAGNOSIS — N186 End stage renal disease: Secondary | ICD-10-CM | POA: Diagnosis not present

## 2021-01-28 ENCOUNTER — Other Ambulatory Visit (HOSPITAL_COMMUNITY): Payer: Self-pay | Admitting: Nephrology

## 2021-01-28 DIAGNOSIS — N186 End stage renal disease: Secondary | ICD-10-CM

## 2021-01-28 NOTE — Telephone Encounter (Signed)
na

## 2021-01-29 DIAGNOSIS — D509 Iron deficiency anemia, unspecified: Secondary | ICD-10-CM | POA: Diagnosis not present

## 2021-01-29 DIAGNOSIS — Z992 Dependence on renal dialysis: Secondary | ICD-10-CM | POA: Diagnosis not present

## 2021-01-29 DIAGNOSIS — N186 End stage renal disease: Secondary | ICD-10-CM | POA: Diagnosis not present

## 2021-01-29 DIAGNOSIS — N2581 Secondary hyperparathyroidism of renal origin: Secondary | ICD-10-CM | POA: Diagnosis not present

## 2021-01-30 ENCOUNTER — Other Ambulatory Visit: Payer: Self-pay | Admitting: Student

## 2021-01-31 ENCOUNTER — Other Ambulatory Visit: Payer: Self-pay

## 2021-01-31 ENCOUNTER — Other Ambulatory Visit (HOSPITAL_COMMUNITY): Payer: Self-pay | Admitting: Nephrology

## 2021-01-31 ENCOUNTER — Ambulatory Visit (HOSPITAL_COMMUNITY)
Admission: RE | Admit: 2021-01-31 | Discharge: 2021-01-31 | Disposition: A | Discharge status: Home / Self Care | Payer: Medicare Other | Source: Ambulatory Visit | Attending: Nephrology | Admitting: Nephrology

## 2021-01-31 DIAGNOSIS — Y841 Kidney dialysis as the cause of abnormal reaction of the patient, or of later complication, without mention of misadventure at the time of the procedure: Secondary | ICD-10-CM | POA: Insufficient documentation

## 2021-01-31 DIAGNOSIS — I132 Hypertensive heart and chronic kidney disease with heart failure and with stage 5 chronic kidney disease, or end stage renal disease: Secondary | ICD-10-CM | POA: Insufficient documentation

## 2021-01-31 DIAGNOSIS — N186 End stage renal disease: Secondary | ICD-10-CM

## 2021-01-31 DIAGNOSIS — Z79899 Other long term (current) drug therapy: Secondary | ICD-10-CM | POA: Diagnosis not present

## 2021-01-31 DIAGNOSIS — I5042 Chronic combined systolic (congestive) and diastolic (congestive) heart failure: Secondary | ICD-10-CM | POA: Insufficient documentation

## 2021-01-31 DIAGNOSIS — Z87891 Personal history of nicotine dependence: Secondary | ICD-10-CM | POA: Insufficient documentation

## 2021-01-31 DIAGNOSIS — I2510 Atherosclerotic heart disease of native coronary artery without angina pectoris: Secondary | ICD-10-CM | POA: Diagnosis not present

## 2021-01-31 DIAGNOSIS — T82510A Breakdown (mechanical) of surgically created arteriovenous fistula, initial encounter: Secondary | ICD-10-CM | POA: Diagnosis not present

## 2021-01-31 DIAGNOSIS — Z992 Dependence on renal dialysis: Secondary | ICD-10-CM | POA: Diagnosis not present

## 2021-01-31 DIAGNOSIS — T82590A Other mechanical complication of surgically created arteriovenous fistula, initial encounter: Secondary | ICD-10-CM | POA: Diagnosis not present

## 2021-01-31 DIAGNOSIS — Z7902 Long term (current) use of antithrombotics/antiplatelets: Secondary | ICD-10-CM | POA: Diagnosis not present

## 2021-01-31 DIAGNOSIS — T82858A Stenosis of vascular prosthetic devices, implants and grafts, initial encounter: Secondary | ICD-10-CM | POA: Diagnosis not present

## 2021-01-31 DIAGNOSIS — Z7982 Long term (current) use of aspirin: Secondary | ICD-10-CM | POA: Insufficient documentation

## 2021-01-31 HISTORY — PX: IR AV DIALY SHUNT INTRO NEEDLE/INTRACATH INITIAL W/PTA/IMG LEFT: IMG6103

## 2021-01-31 LAB — BASIC METABOLIC PANEL
Anion gap: 12 (ref 5–15)
BUN: 57 mg/dL — ABNORMAL HIGH (ref 8–23)
CO2: 30 mmol/L (ref 22–32)
Calcium: 9.3 mg/dL (ref 8.9–10.3)
Chloride: 94 mmol/L — ABNORMAL LOW (ref 98–111)
Creatinine, Ser: 11.81 mg/dL — ABNORMAL HIGH (ref 0.61–1.24)
GFR, Estimated: 4 mL/min — ABNORMAL LOW (ref >60)
Glucose, Bld: 80 mg/dL (ref 70–99)
Potassium: 4.7 mmol/L (ref 3.5–5.1)
Sodium: 136 mmol/L (ref 135–145)

## 2021-01-31 MED ORDER — IOHEXOL 300 MG/ML  SOLN
100.0000 mL | Freq: Once | INTRAMUSCULAR | Status: AC | PRN
  Administered 2021-01-31: 40 mL via INTRAVENOUS

## 2021-01-31 MED ORDER — FENTANYL CITRATE (PF) 100 MCG/2ML IJ SOLN
INTRAMUSCULAR | Status: AC | PRN
  Administered 2021-01-31: 50 ug via INTRAVENOUS

## 2021-01-31 MED ORDER — LIDOCAINE HCL 1 % IJ SOLN
INTRAMUSCULAR | Status: AC
  Filled 2021-01-31: qty 20

## 2021-01-31 MED ORDER — SODIUM CHLORIDE 0.9 % IV SOLN: INTRAVENOUS | Status: DC

## 2021-01-31 MED ORDER — SODIUM CHLORIDE 0.9 % IV SOLN
INTRAVENOUS | Status: AC | PRN
  Administered 2021-01-31: 10 mL/h via INTRAVENOUS

## 2021-01-31 MED ORDER — MIDAZOLAM HCL 2 MG/2ML IJ SOLN
INTRAMUSCULAR | Status: AC
  Filled 2021-01-31: qty 2

## 2021-01-31 MED ORDER — IOHEXOL 300 MG/ML  SOLN
150.0000 mL | Freq: Once | INTRAMUSCULAR | Status: AC | PRN
  Administered 2021-01-31: 40 mL via INTRAVENOUS

## 2021-01-31 MED ORDER — LIDOCAINE HCL 1 % IJ SOLN
INTRAMUSCULAR | Status: AC | PRN
  Administered 2021-01-31: 10 mL

## 2021-01-31 MED ORDER — FENTANYL CITRATE (PF) 100 MCG/2ML IJ SOLN
INTRAMUSCULAR | Status: AC
  Filled 2021-01-31: qty 2

## 2021-01-31 NOTE — Consult Note (Signed)
Chief Complaint: Patient was seen in consultation today for left arm fistulogram with possible endovascular intervention  Referring Physician(s): Sanford,Ryan B  Supervising Physician: Sandi Mariscal  Patient Status: [facility] - Out-pt  History of Present Illness: Kenneth [name] is a 65 y.o. male with history of CHF, cardiomyopathy, coronary artery disease, end-stage renal disease, prior alcohol abuse, hypertension, prior MI-PEA arrest, left arm AV fistula placement in 2017 with revision in 2020.  He now has reported history of poorly functioning left arm fistula and presents today for fistulogram and possible endovascular intervention.  Past Medical History:  Diagnosis Date  . Acute CHF ([place]) 01/2018  . Cardiomyopathy secondary    likely related to HTN heart disease; possibly ETOH related as well  . Chronic combined systolic and diastolic heart failure (HCC)    Echocardiogram 09/22/11: Moderate LVH, EF 99991111, grade 3 diastolic dysfunction, mild MR, moderate to severe LAE, mild RVE, mild to moderate TR, small to moderate pericardial effusion  . Coronary artery disease    not felt to be candidate for CABG 08/2018; medical thearpy recomended due to high risk of PCI   . Dysrhythmia    aflutter 04/2016, afib 02/2018, not felt to be a candidate for anticoagulation due to non-compliance and ETOH  . ESRD (end stage renal disease) on dialysis ([place])    due to hypertensive nephrosclerosis; TTS; [facility] (09/01/2018)  . History of alcohol abuse   . Hypertension   . Myocardial infarction [facility])    " mild " per daughter  . PEA (Pulseless electrical activity) ([place])    PEA arrest 03/09/18, treated empirically for hyperkalemia, shock x1 for WCT, given amiodarone, ROSC after 10 min of ACLS    Past Surgical History:  Procedure Laterality Date  . AV FISTULA PLACEMENT Left 05/14/2016   Procedure: LEFT ARM BASILIC VEIN TRANSPOSITION;  Surgeon: Rosetta Posner, MD;  Location: [place];  Service: Vascular;   Laterality: Left;  . INSERTION OF DIALYSIS CATHETER N/A 06/20/2019   Procedure: INSERTION OF TUNNELED  DIALYSIS CATHETER;  Surgeon: Elam Dutch, MD;  Location: [place];  Service: Vascular;  Laterality: N/A;  . LEFT HEART CATH AND CORONARY ANGIOGRAPHY N/A 09/02/2018   Procedure: LEFT HEART CATH AND CORONARY ANGIOGRAPHY;  Surgeon: Nelva Bush, MD;  Location: [place] [facility];  Service: Cardiovascular;  Laterality: N/A;  . LEFT HEART CATH AND CORONARY ANGIOGRAPHY N/A 05/14/2020   Procedure: LEFT HEART CATH AND CORONARY ANGIOGRAPHY;  Surgeon: Sherren Mocha, MD;  Location: [place] [facility];  Service: Cardiovascular;  Laterality: N/A;  . PERIPHERAL VASCULAR CATHETERIZATION N/A 05/13/2016   Procedure: Dialysis/Perma Catheter Insertion;  Surgeon: Serafina Mitchell, MD;  Location: [place] [facility];  Service: Cardiovascular;  Laterality: N/A;  . REVISION OF ARTERIOVENOUS GORETEX GRAFT Left 0000000   Procedure: PLICATION OF ARTERIOVENOUS FISTULA LEFT ARM;  Surgeon: Elam Dutch, MD;  Location: [place];  Service: Vascular;  Laterality: Left;    Allergies: Patient has no known allergies.  Medications: Prior to Admission medications   Medication Sig Start Date End Date Taking? Authorizing Provider  amLODipine (NORVASC) 10 MG tablet Take 0.5 tablets (5 mg total) by mouth daily. 12/19/20  Yes Regalado, Belkys A, MD  aspirin 81 MG EC tablet TAKE 1 TABLET (81 MG TOTAL) BY MOUTH DAILY. SWALLOW WHOLE. Patient taking differently: Take by mouth 5 (five) times daily. SWALLOW WHOLE. 12/19/20 12/19/21 Yes Regalado, Belkys A, MD  atorvastatin (LIPITOR) 80 MG tablet TAKE 1 TABLET (80 MG TOTAL) BY MOUTH  AT BEDTIME. Patient taking differently: Take 80 mg by mouth at bedtime. 12/19/20 12/19/21 Yes Regalado, Belkys A, MD  B Complex-C-Folic Acid (RENA-VITE RX) 1 MG TABS Take 1 tablet by mouth Every Tuesday,Thursday,and Saturday with dialysis. 10/05/20  Yes [provider]  clopidogrel (PLAVIX) 75 MG  tablet TAKE 1 TABLET (75 MG TOTAL) BY MOUTH EVERY EVENING. Patient taking differently: Take 75 mg by mouth every evening. 12/19/20 12/19/21 Yes Regalado, Belkys A, MD  diphenhydramine-acetaminophen (TYLENOL PM EXTRA STRENGTH) 25-500 MG TABS tablet Take 2 tablets by mouth at bedtime as needed (pain). 05/12/18  Yes [provider]  famotidine (PEPCID) 20 MG tablet Take 1 tablet (20 mg total) by mouth 2 (two) times daily as needed for heartburn. Patient taking differently: Take 20 mg by mouth daily. 12/17/20  Yes [name], MD  ibuprofen (ADVIL) 200 MG tablet Take 400 mg by mouth every 6 (six) hours as needed for mild pain or headache.   Yes [provider]  isosorbide mononitrate (IMDUR) 30 MG 24 hr tablet TAKE 3 TABLETS (90 MG TOTAL) BY MOUTH DAILY. Patient taking differently: Take 90 mg by mouth daily. 12/19/20 12/19/21 Yes Regalado, Belkys A, MD  lanthanum (FOSRENOL) 1000 MG chewable tablet Chew 1,000 mg by mouth 3 (three) times daily with meals. 08/23/20  Yes [provider]  metoprolol succinate (TOPROL-XL) 25 MG 24 hr tablet Take 1 tablet (25 mg total) by mouth in the morning and at bedtime. 12/19/20  Yes Regalado, Belkys A, MD  nitroGLYCERIN (NITROSTAT) 0.4 MG SL tablet PLACE 1 TABLET (0.4 MG TOTAL) UNDER THE TONGUE EVERY FIVE MINUTES AS NEEDED FOR CHEST PAIN (MAX 3 DOSES). Patient taking differently: Place 0.4 mg under the tongue every 5 (five) minutes as needed for chest pain. 12/19/20 12/19/21 Yes Regalado, Belkys A, MD  pantoprazole (PROTONIX) 40 MG tablet Take 1 tablet (40 mg total) by mouth daily before breakfast. 12/12/20  Yes [name], MD     Family History  Problem Relation Age of Onset  . Emphysema Mother   . Cirrhosis Father     Social History   Socioeconomic History  . Marital status: Divorced    Spouse name: Not on file  . Number of children: Not on file  . Years of education: Not on file  . Highest education level: Not on file   Occupational History  . Occupation: retired  Tobacco Use  . Smoking status: Former Smoker    Packs/day: 2.00    Years: 48.00    Pack years: 96.00    Types: Cigars    Quit date: 10/27/2017    Years since quitting: 3.2  . Smokeless tobacco: Former Systems developer    Types: Secondary school teacher  . Vaping Use: Never used  Substance and Sexual Activity  . Alcohol use: Not Currently    Comment: h/o very heavy use, quit about 2019  . Drug use: No  . Sexual activity: Not Currently  Other Topics Concern  . Not on file  Social History Narrative  . Not on file   Social Determinants of Health   Financial Resource Strain: Not on file  Food Insecurity: Not on file  Transportation Needs: Not on file  Physical Activity: Not on file  Stress: Not on file  Social Connections: Not on file      Review of Systems currently denies fever, headache, chest pain, dyspnea, cough, abdominal/back pain, nausea, vomiting or bleeding.  Does have some foot pain and onychodystrophic nails with onychomycosis.  Vital Signs: BP 140/78   Pulse 76   Temp (!) 97.4 F (36.3 C) (Oral)   Ht '5\' 11"'$  (1.803 m)   Wt 200 lb (90.7 kg)   SpO2 99%   BMI 27.89 kg/m   Physical Exam awake, alert.  Chest clear to auscultation bilaterally.  Heart with regular rate and rhythm.  Abdomen soft, positive bowel sounds, nontender.  No significant lower extremity edema.  Left arm fistula with positive thrill/bruit. Imaging: No results found.  Labs:  CBC: Recent Labs    11/27/20 0937 11/27/20 2248 12/17/20 1743 12/18/20 0245  WBC 9.7 6.5 9.2 9.1  HGB 10.5* 13.1 9.6* 9.2*  HCT 32.4* 38.1* 29.8* 27.3*  PLT 270 267 254 251    COAGS: Recent Labs    05/12/20 2015 09/29/20 2111 10/26/20 2251  INR 1.1 1.0 1.0  APTT 195*  --   --     BMP: Recent Labs    05/13/20 0503 05/14/20 0518 05/15/20 0424 05/16/20 0615 09/02/20 2248 11/28/20 0332 12/17/20 1743 12/18/20 0245 12/19/20 0219  NA 135 138 136 137   < > 139 140 140  136  K 4.3 4.1 4.4 4.3   < > 4.2 4.6 4.9 4.2  CL 93* 96* 93* 97*   < > 96* 97* 99 96*  CO2 '30 30 28 29   '$ < > '23 29 25 26  '$ GLUCOSE 95 89 76 79   < > 129* 89 82 129*  BUN 28* 37* 52* 31*   < > 49* 52* 58* 38*  CALCIUM 8.2* 8.2* 8.4* 8.5*   < > 8.8* 9.2 9.0 8.4*  CREATININE 9.41* 12.53* 14.93* 10.26*   < > 10.51* 11.23* 12.34* 8.49*  GFRNONAA 5* 4* 3* 5*   < > 5* 5* 4* 6*  GFRAA 6* 4* 3* 5*  --   --   --   --   --    < > = values in this interval not displayed.    LIVER FUNCTION TESTS: Recent Labs    09/21/20 1708 09/25/20 1301 10/16/20 0136 10/18/20 1405 10/26/20 2251 10/27/20 1945 11/19/20 1934 11/20/20 0814 11/27/20 2248 12/18/20 0245 12/19/20 0219  BILITOT 0.6  --  0.8  --  0.5  --  0.8  --   --   --   --   AST 12*  --  13*  --  14*  --  14*  --   --   --   --   ALT 9  --  10  --  10  --  10  --   --   --   --   ALKPHOS 55  --  62  --  66  --  60  --   --   --   --   PROT 7.4  --  7.9  --  7.8  --  8.1  --   --   --   --   ALBUMIN 3.2*   < > 3.2*   < > 3.4*   < > 3.1* 2.9* 3.3* 3.1* 2.9*   < > = values in this interval not displayed.    TUMOR MARKERS: No results for input(s): AFPTM, CEA, CA199, CHROMGRNA in the last 8760 hours.    Assessment/plan: 65 y.o. male with history of CHF, cardiomyopathy, coronary artery disease, end-stage renal disease, prior alcohol abuse, hypertension, prior MI-PEA arrest, left arm AV fistula placement in 2017 with revision in 2020.  He now has reported history  of poorly functioning left arm fistula and presents today for fistulogram and possible endovascular intervention.  Details/risks of procedure, including but not limited to, internal bleeding, infection, injury to adjacent structures gust with patient with his understanding and consent.    Thank you for this interesting consult.  I greatly enjoyed meeting BALKE METTERT and look forward to participating in their care.  A copy of this report was sent to the requesting provider on this  date.  Electronically Signed: D. Rowe Erving, PA-C 01/31/2021, 8:17 AM   I spent a total of 25 minutes    in face to face in clinical consultation, greater than 50% of which was counseling/coordinating care for left arm fistulogram with possible endovascular intervention, possible hemodialysis catheter placement.

## 2021-01-31 NOTE — Procedures (Signed)
Pre Procedural Dx: ESRD, poorly functioning HD catheter. Post Procedural Dx: Same  Successful angioplasty/fibrin sheath disrpution of the SVC.  Successful fluoroscopic guided exchange of existing right jugular approach dialysis catheter.   Catheter is ready for immediate use.    EBL: None  No immediate post procedural complications.   Ronny Bacon, MD Pager #: [phone]

## 2021-01-31 NOTE — Progress Notes (Signed)
Spoke to [employer] RN/ IR and informed her pt does not have post procedure ride. Pt will to do procedure without sedation. Pt will then ride scat bus.

## 2021-01-31 NOTE — Sedation Documentation (Signed)
Will not receive conscious sedation as does not have someone to drive him home. Is using [employer] transportation home.

## 2021-01-31 NOTE — Discharge Instructions (Addendum)
Dialysis Fistulogram, Care After The following information offers guidance on how to care for yourself after your procedure. Your health care provider may also give you more specific instructions. If you have problems or questions, contact your health care provider. What can I expect after the procedure? After the procedure, it is common to have:  A small amount of discomfort in the area where the small tube (catheter) was placed for the procedure.  A small amount of bruising around the fistula.  Sleepiness and tiredness (fatigue). Follow these instructions at home: Puncture site care  Follow instructions from your health care provider about how to take care of the site where catheters were inserted. Make sure you: ? Wash your hands with soap and water for at least 20 seconds before and after you change your bandage (dressing). If soap and water are not available, use hand sanitizer. ? Change your dressing as told by your health care provider. ? Leave stitches (sutures), skin glue, or adhesive strips in place. These skin closures may need to stay in place for 2 weeks or longer. If adhesive strip edges start to loosen and curl up, you may trim the loose edges. Do not remove adhesive strips completely unless your health care provider tells you to do that.  Check your puncture area every day for signs of infection. Check for: ? More redness, swelling, or pain. ? Fluid or blood. ? Warmth. ? Pus or a bad smell.   Activity  Rest as much as you can.  If you were given a sedative during the procedure, it can affect you for several hours. Do not drive or operate machinery until your health care provider says that it is safe.  Do not lift anything that is heavier than 5 lb (2.3 kg), or the limit that you are told, on the day of your procedure.  Do not do anything strenuous with your arm for the rest of the day. Avoid household activities, such as vacuuming.  Return to your normal activities as  told by your health care provider. Ask your health care provider what activities are safe for you. Safety To prevent damage to your graft or fistula:  Do not wear tight-fitting clothing or jewelry on the arm or leg that has your graft or fistula.  Tell all your health care providers that you have a dialysis fistula or graft.  Do not allow blood draws, IVs, or blood pressure readings to be done in the arm that has your fistula or graft.  Do not allow flu shots or vaccinations in the arm with your fistula or graft. General instructions  Take over-the-counter and prescription medicines only as told by your health care provider.  Do not take baths, swim, or use a hot tub until your health care provider approves. Ask your health care provider if you may take showers. You may only be allowed to take sponge baths.  Monitor your dialysis fistula closely. Check to make sure that you can feel a vibration or buzz (a thrill) when you put your fingers over the fistula.  Keep all follow-up visits. This is important. Contact a health care provider if:  You have more redness, swelling, or pain at the site where the catheter was put in.  You have fluid or blood coming from the catheter site.  You have pus or a bad smell coming from the catheter site.  Your catheter site feels warm.  You have a fever or chills. Get help right away if:    You have bleeding from the vascular access site that does not stop.  You feel weak.  You have trouble balancing.  You have trouble moving your arms or legs.  You have problems with your speech or vision.  You can no longer feel a vibration or buzz when you put your fingers over your fistula.  The limb that was used for the procedure swells or becomes painful, cold, blue, or pale white.  You have chest pain or shortness of breath. These symptoms may represent a serious problem that is an emergency. Do not wait to see if the symptoms will go away. Get  medical help right away. Call your local emergency services (911 in the [place] Do not drive yourself to the hospital. Summary  After a dialysis fistulogram, it is common to have a small amount of discomfort or bruising in the area where the small, thin tube (catheter) was placed.  Rest as much as you can after your procedure. Return to your normal activities as told by your health care provider.  Take over-the-counter and prescription medicines only as told by your health care provider.  Follow instructions from your health care provider about how to take care of the site where the catheter was inserted.  Keep all follow-up visits. This is important. This information is not intended to replace advice given to you by your health care provider. Make sure you discuss any questions you have with your health care provider. Document Revised: 05/23/2020 Document Reviewed: 05/23/2020 Elsevier Patient Education  2021 Elsevier Inc.  

## 2021-01-31 NOTE — Sedation Documentation (Signed)
Thrill present to RUE fistula

## 2021-01-31 NOTE — Procedures (Signed)
Pre Procedure Dx: ESRD Post Procedure Dx: Same  Technically successful fistulogram with angioplasty.    EBL: Minimal  No immediate complications.  Jay Preeya Cleckley, MD Pager #: [phone]  

## 2021-02-01 DIAGNOSIS — N2581 Secondary hyperparathyroidism of renal origin: Secondary | ICD-10-CM | POA: Diagnosis not present

## 2021-02-01 DIAGNOSIS — Z992 Dependence on renal dialysis: Secondary | ICD-10-CM | POA: Diagnosis not present

## 2021-02-01 DIAGNOSIS — D509 Iron deficiency anemia, unspecified: Secondary | ICD-10-CM | POA: Diagnosis not present

## 2021-02-01 DIAGNOSIS — N186 End stage renal disease: Secondary | ICD-10-CM | POA: Diagnosis not present

## 2021-02-02 DIAGNOSIS — N186 End stage renal disease: Secondary | ICD-10-CM | POA: Diagnosis not present

## 2021-02-02 DIAGNOSIS — D509 Iron deficiency anemia, unspecified: Secondary | ICD-10-CM | POA: Diagnosis not present

## 2021-02-02 DIAGNOSIS — N2581 Secondary hyperparathyroidism of renal origin: Secondary | ICD-10-CM | POA: Diagnosis not present

## 2021-02-02 DIAGNOSIS — Z992 Dependence on renal dialysis: Secondary | ICD-10-CM | POA: Diagnosis not present

## 2021-02-04 ENCOUNTER — Ambulatory Visit: Payer: Medicare Other | Admitting: Podiatry

## 2021-02-05 DIAGNOSIS — N186 End stage renal disease: Secondary | ICD-10-CM | POA: Diagnosis not present

## 2021-02-05 DIAGNOSIS — D509 Iron deficiency anemia, unspecified: Secondary | ICD-10-CM | POA: Diagnosis not present

## 2021-02-05 DIAGNOSIS — N2581 Secondary hyperparathyroidism of renal origin: Secondary | ICD-10-CM | POA: Diagnosis not present

## 2021-02-05 DIAGNOSIS — Z992 Dependence on renal dialysis: Secondary | ICD-10-CM | POA: Diagnosis not present

## 2021-02-07 DIAGNOSIS — N2581 Secondary hyperparathyroidism of renal origin: Secondary | ICD-10-CM | POA: Diagnosis not present

## 2021-02-07 DIAGNOSIS — D509 Iron deficiency anemia, unspecified: Secondary | ICD-10-CM | POA: Diagnosis not present

## 2021-02-07 DIAGNOSIS — N186 End stage renal disease: Secondary | ICD-10-CM | POA: Diagnosis not present

## 2021-02-07 DIAGNOSIS — Z992 Dependence on renal dialysis: Secondary | ICD-10-CM | POA: Diagnosis not present

## 2021-02-08 ENCOUNTER — Other Ambulatory Visit (INDEPENDENT_AMBULATORY_CARE_PROVIDER_SITE_OTHER): Payer: Self-pay | Admitting: Family Medicine

## 2021-02-08 DIAGNOSIS — E7800 Pure hypercholesterolemia, unspecified: Secondary | ICD-10-CM

## 2021-02-08 DIAGNOSIS — R748 Abnormal levels of other serum enzymes: Secondary | ICD-10-CM

## 2021-02-08 DIAGNOSIS — E781 Pure hyperglyceridemia: Secondary | ICD-10-CM

## 2021-02-09 DIAGNOSIS — N186 End stage renal disease: Secondary | ICD-10-CM | POA: Diagnosis not present

## 2021-02-09 DIAGNOSIS — N2581 Secondary hyperparathyroidism of renal origin: Secondary | ICD-10-CM | POA: Diagnosis not present

## 2021-02-09 DIAGNOSIS — D509 Iron deficiency anemia, unspecified: Secondary | ICD-10-CM | POA: Diagnosis not present

## 2021-02-09 DIAGNOSIS — Z992 Dependence on renal dialysis: Secondary | ICD-10-CM | POA: Diagnosis not present

## 2021-02-12 DIAGNOSIS — D509 Iron deficiency anemia, unspecified: Secondary | ICD-10-CM | POA: Diagnosis not present

## 2021-02-12 DIAGNOSIS — Z992 Dependence on renal dialysis: Secondary | ICD-10-CM | POA: Diagnosis not present

## 2021-02-12 DIAGNOSIS — N2581 Secondary hyperparathyroidism of renal origin: Secondary | ICD-10-CM | POA: Diagnosis not present

## 2021-02-12 DIAGNOSIS — N186 End stage renal disease: Secondary | ICD-10-CM | POA: Diagnosis not present

## 2021-02-14 ENCOUNTER — Telehealth (HOSPITAL_BASED_OUTPATIENT_CLINIC_OR_DEPARTMENT_OTHER): Payer: Self-pay

## 2021-02-14 DIAGNOSIS — Z992 Dependence on renal dialysis: Secondary | ICD-10-CM | POA: Diagnosis not present

## 2021-02-14 DIAGNOSIS — N2581 Secondary hyperparathyroidism of renal origin: Secondary | ICD-10-CM | POA: Diagnosis not present

## 2021-02-14 DIAGNOSIS — D509 Iron deficiency anemia, unspecified: Secondary | ICD-10-CM | POA: Diagnosis not present

## 2021-02-14 DIAGNOSIS — N186 End stage renal disease: Secondary | ICD-10-CM | POA: Diagnosis not present

## 2021-02-14 NOTE — Telephone Encounter (Signed)
Processed first kidney transplant referral. Plan to forward chart to RN for initial review.

## 2021-02-15 ENCOUNTER — Ambulatory Visit: Payer: Medicare Other

## 2021-02-15 ENCOUNTER — Telehealth: Payer: Self-pay | Admitting: Family Medicine

## 2021-02-15 NOTE — Telephone Encounter (Signed)
Left message asking patient to call office. Mickel Baas will be out of office please r/s  AWV appointment

## 2021-02-16 DIAGNOSIS — N186 End stage renal disease: Secondary | ICD-10-CM | POA: Diagnosis not present

## 2021-02-16 DIAGNOSIS — Z992 Dependence on renal dialysis: Secondary | ICD-10-CM | POA: Diagnosis not present

## 2021-02-16 DIAGNOSIS — D509 Iron deficiency anemia, unspecified: Secondary | ICD-10-CM | POA: Diagnosis not present

## 2021-02-16 DIAGNOSIS — N2581 Secondary hyperparathyroidism of renal origin: Secondary | ICD-10-CM | POA: Diagnosis not present

## 2021-02-18 ENCOUNTER — Other Ambulatory Visit: Payer: Self-pay

## 2021-02-18 ENCOUNTER — Ambulatory Visit (INDEPENDENT_AMBULATORY_CARE_PROVIDER_SITE_OTHER): Payer: Medicare Other | Admitting: Podiatry

## 2021-02-18 DIAGNOSIS — I739 Peripheral vascular disease, unspecified: Secondary | ICD-10-CM

## 2021-02-18 DIAGNOSIS — R0989 Other specified symptoms and signs involving the circulatory and respiratory systems: Secondary | ICD-10-CM | POA: Diagnosis not present

## 2021-02-18 MED ORDER — TRAMADOL HCL 50 MG PO TABS: 50.0000 mg | ORAL_TABLET | Freq: Four times a day (QID) | ORAL | 0 refills | Status: AC | PRN

## 2021-02-18 NOTE — Progress Notes (Signed)
   HPI: 65 y.o. male presenting today for evaluation of left forefoot pain this been going on for the last month.  Patient states is very painful.  He denies history of injury.  He has not done anything for treatment.  Past Medical History:  Diagnosis Date  . Acute CHF ([place]) 01/2018  . Cardiomyopathy secondary    likely related to HTN heart disease; possibly ETOH related as well  . Chronic combined systolic and diastolic heart failure (HCC)    Echocardiogram 09/22/11: Moderate LVH, EF 99991111, grade 3 diastolic dysfunction, mild MR, moderate to severe LAE, mild RVE, mild to moderate TR, small to moderate pericardial effusion  . Coronary artery disease    not felt to be candidate for CABG 08/2018; medical thearpy recomended due to high risk of PCI   . Dysrhythmia    aflutter 04/2016, afib 02/2018, not felt to be a candidate for anticoagulation due to non-compliance and ETOH  . ESRD (end stage renal disease) on dialysis ([place])    due to hypertensive nephrosclerosis; TTS; [facility] (09/01/2018)  . History of alcohol abuse   . Hypertension   . Myocardial infarction [facility])    " mild " per daughter  . PEA (Pulseless electrical activity) ([place])    PEA arrest 03/09/18, treated empirically for hyperkalemia, shock x1 for WCT, given amiodarone, ROSC after 10 min of ACLS     Physical Exam: General: The patient is alert and oriented x3 in no acute distress.  Dermatology: Skin is warm, dry and supple bilateral lower extremities. Negative for open lesions or macerations.  Darker skin coloration noted to the left lower extremity and the left foot is cool compared to the contralateral limb suggestive of diminished circulatory status LLE  Vascular: Unable to palpate pedal pulses left lower extremity. No edema or erythema noted.   Neurological: Epicritic and protective threshold grossly intact bilaterally.   Musculoskeletal Exam: Range of motion within normal limits to all pedal and ankle joints bilateral.  Muscle strength 5/5 in all groups bilateral.  Pain associated to the left forefoot  Assessment: 1.  Diminished pulses LLE   Plan of Care:  1. Patient evaluated. .  2.  After evaluation I do believe this is a vascular etiology and that is why the patient is having such pain to the left lower extremity. 3.  Order placed for arterial ABIs left lower extremity 4.  Prescription for tramadol 50 mg  5.  Return to clinic in 2 weeks to review results and discuss further treatment options.  If labs are abnormal recommend referral and vascular consult      Edrick Kins, DPM [facility]  Dr. Edrick Kins, DPM    [address], [place] [address]                Office [phone]  Fax [phone]

## 2021-02-19 DIAGNOSIS — D509 Iron deficiency anemia, unspecified: Secondary | ICD-10-CM | POA: Diagnosis not present

## 2021-02-19 DIAGNOSIS — N2581 Secondary hyperparathyroidism of renal origin: Secondary | ICD-10-CM | POA: Diagnosis not present

## 2021-02-19 DIAGNOSIS — N186 End stage renal disease: Secondary | ICD-10-CM | POA: Diagnosis not present

## 2021-02-19 DIAGNOSIS — Z992 Dependence on renal dialysis: Secondary | ICD-10-CM | POA: Diagnosis not present

## 2021-02-21 DIAGNOSIS — N2581 Secondary hyperparathyroidism of renal origin: Secondary | ICD-10-CM | POA: Diagnosis not present

## 2021-02-21 DIAGNOSIS — D509 Iron deficiency anemia, unspecified: Secondary | ICD-10-CM | POA: Diagnosis not present

## 2021-02-21 DIAGNOSIS — N186 End stage renal disease: Secondary | ICD-10-CM | POA: Diagnosis not present

## 2021-02-21 DIAGNOSIS — Z992 Dependence on renal dialysis: Secondary | ICD-10-CM | POA: Diagnosis not present

## 2021-02-23 DIAGNOSIS — N186 End stage renal disease: Secondary | ICD-10-CM | POA: Diagnosis not present

## 2021-02-23 DIAGNOSIS — Z992 Dependence on renal dialysis: Secondary | ICD-10-CM | POA: Diagnosis not present

## 2021-02-23 DIAGNOSIS — D509 Iron deficiency anemia, unspecified: Secondary | ICD-10-CM | POA: Diagnosis not present

## 2021-02-23 DIAGNOSIS — N2581 Secondary hyperparathyroidism of renal origin: Secondary | ICD-10-CM | POA: Diagnosis not present

## 2021-02-24 DIAGNOSIS — N186 End stage renal disease: Secondary | ICD-10-CM | POA: Diagnosis not present

## 2021-02-24 DIAGNOSIS — Z992 Dependence on renal dialysis: Secondary | ICD-10-CM | POA: Diagnosis not present

## 2021-02-24 DIAGNOSIS — I129 Hypertensive chronic kidney disease with stage 1 through stage 4 chronic kidney disease, or unspecified chronic kidney disease: Secondary | ICD-10-CM | POA: Diagnosis not present

## 2021-02-25 ENCOUNTER — Other Ambulatory Visit: Payer: Self-pay

## 2021-02-25 ENCOUNTER — Ambulatory Visit (HOSPITAL_COMMUNITY)
Admission: RE | Admit: 2021-02-25 | Discharge: 2021-02-25 | Disposition: A | Discharge status: Home / Self Care | Payer: Medicare Other | Source: Ambulatory Visit | Attending: Podiatry | Admitting: Podiatry

## 2021-02-25 DIAGNOSIS — R0989 Other specified symptoms and signs involving the circulatory and respiratory systems: Secondary | ICD-10-CM | POA: Diagnosis present

## 2021-02-25 DIAGNOSIS — I739 Peripheral vascular disease, unspecified: Secondary | ICD-10-CM | POA: Diagnosis not present

## 2021-02-26 ENCOUNTER — Telehealth (HOSPITAL_BASED_OUTPATIENT_CLINIC_OR_DEPARTMENT_OTHER): Payer: Self-pay

## 2021-02-26 ENCOUNTER — Other Ambulatory Visit (HOSPITAL_BASED_OUTPATIENT_CLINIC_OR_DEPARTMENT_OTHER): Payer: Self-pay

## 2021-02-26 DIAGNOSIS — Z01818 Encounter for other preprocedural examination: Secondary | ICD-10-CM

## 2021-02-26 DIAGNOSIS — D631 Anemia in chronic kidney disease: Secondary | ICD-10-CM | POA: Diagnosis not present

## 2021-02-26 DIAGNOSIS — D509 Iron deficiency anemia, unspecified: Secondary | ICD-10-CM | POA: Diagnosis not present

## 2021-02-26 DIAGNOSIS — N186 End stage renal disease: Secondary | ICD-10-CM | POA: Diagnosis not present

## 2021-02-26 DIAGNOSIS — N2581 Secondary hyperparathyroidism of renal origin: Secondary | ICD-10-CM | POA: Diagnosis not present

## 2021-02-26 DIAGNOSIS — Z992 Dependence on renal dialysis: Secondary | ICD-10-CM | POA: Diagnosis not present

## 2021-02-26 NOTE — Telephone Encounter (Signed)
Patient called to check status of referral, [id number] not yet submitted. Advised patient who coordinator was and provided phone number to [place]. Advised patient to call [place] in about 1-2 weeks to follow up.     Sending to [facility] to request [id number] and to follow up with patient, thank you!

## 2021-02-26 NOTE — Telephone Encounter (Signed)
[id number] created and submitted on 02/26/21. PC to call pt to schedule once authorization has been received.

## 2021-02-27 ENCOUNTER — Other Ambulatory Visit: Payer: Self-pay | Admitting: Podiatry

## 2021-02-27 DIAGNOSIS — I739 Peripheral vascular disease, unspecified: Secondary | ICD-10-CM

## 2021-02-27 DIAGNOSIS — R0989 Other specified symptoms and signs involving the circulatory and respiratory systems: Secondary | ICD-10-CM

## 2021-02-28 DIAGNOSIS — Z992 Dependence on renal dialysis: Secondary | ICD-10-CM | POA: Diagnosis not present

## 2021-02-28 DIAGNOSIS — N186 End stage renal disease: Secondary | ICD-10-CM | POA: Diagnosis not present

## 2021-02-28 DIAGNOSIS — N2581 Secondary hyperparathyroidism of renal origin: Secondary | ICD-10-CM | POA: Diagnosis not present

## 2021-02-28 DIAGNOSIS — D631 Anemia in chronic kidney disease: Secondary | ICD-10-CM | POA: Diagnosis not present

## 2021-02-28 DIAGNOSIS — D509 Iron deficiency anemia, unspecified: Secondary | ICD-10-CM | POA: Diagnosis not present

## 2021-03-02 DIAGNOSIS — D631 Anemia in chronic kidney disease: Secondary | ICD-10-CM | POA: Diagnosis not present

## 2021-03-02 DIAGNOSIS — N2581 Secondary hyperparathyroidism of renal origin: Secondary | ICD-10-CM | POA: Diagnosis not present

## 2021-03-02 DIAGNOSIS — D509 Iron deficiency anemia, unspecified: Secondary | ICD-10-CM | POA: Diagnosis not present

## 2021-03-02 DIAGNOSIS — Z992 Dependence on renal dialysis: Secondary | ICD-10-CM | POA: Diagnosis not present

## 2021-03-02 DIAGNOSIS — N186 End stage renal disease: Secondary | ICD-10-CM | POA: Diagnosis not present

## 2021-03-04 ENCOUNTER — Telehealth (INDEPENDENT_AMBULATORY_CARE_PROVIDER_SITE_OTHER): Payer: Self-pay

## 2021-03-04 ENCOUNTER — Telehealth (HOSPITAL_BASED_OUTPATIENT_CLINIC_OR_DEPARTMENT_OTHER): Payer: Self-pay

## 2021-03-04 NOTE — Telephone Encounter (Signed)
Received an email from Kimi at Dr. Dellie Catholic office. Per her report, patient's GFR is 26. Since his renal function has improved, too early for Transplant. Current referral closed. Dr. Rosey Bath will re-refer in the future, if needed.

## 2021-03-05 DIAGNOSIS — Z992 Dependence on renal dialysis: Secondary | ICD-10-CM | POA: Diagnosis not present

## 2021-03-05 DIAGNOSIS — D631 Anemia in chronic kidney disease: Secondary | ICD-10-CM | POA: Diagnosis not present

## 2021-03-05 DIAGNOSIS — N2581 Secondary hyperparathyroidism of renal origin: Secondary | ICD-10-CM | POA: Diagnosis not present

## 2021-03-05 DIAGNOSIS — N186 End stage renal disease: Secondary | ICD-10-CM | POA: Diagnosis not present

## 2021-03-05 DIAGNOSIS — D509 Iron deficiency anemia, unspecified: Secondary | ICD-10-CM | POA: Diagnosis not present

## 2021-03-06 NOTE — Telephone Encounter (Signed)
documentation only

## 2021-03-07 DIAGNOSIS — N186 End stage renal disease: Secondary | ICD-10-CM | POA: Diagnosis not present

## 2021-03-07 DIAGNOSIS — D631 Anemia in chronic kidney disease: Secondary | ICD-10-CM | POA: Diagnosis not present

## 2021-03-07 DIAGNOSIS — N2581 Secondary hyperparathyroidism of renal origin: Secondary | ICD-10-CM | POA: Diagnosis not present

## 2021-03-07 DIAGNOSIS — Z992 Dependence on renal dialysis: Secondary | ICD-10-CM | POA: Diagnosis not present

## 2021-03-07 DIAGNOSIS — D509 Iron deficiency anemia, unspecified: Secondary | ICD-10-CM | POA: Diagnosis not present

## 2021-03-09 DIAGNOSIS — Z992 Dependence on renal dialysis: Secondary | ICD-10-CM | POA: Diagnosis not present

## 2021-03-09 DIAGNOSIS — D631 Anemia in chronic kidney disease: Secondary | ICD-10-CM | POA: Diagnosis not present

## 2021-03-09 DIAGNOSIS — D509 Iron deficiency anemia, unspecified: Secondary | ICD-10-CM | POA: Diagnosis not present

## 2021-03-09 DIAGNOSIS — N186 End stage renal disease: Secondary | ICD-10-CM | POA: Diagnosis not present

## 2021-03-09 DIAGNOSIS — N2581 Secondary hyperparathyroidism of renal origin: Secondary | ICD-10-CM | POA: Diagnosis not present

## 2021-03-12 DIAGNOSIS — D631 Anemia in chronic kidney disease: Secondary | ICD-10-CM | POA: Diagnosis not present

## 2021-03-12 DIAGNOSIS — Z992 Dependence on renal dialysis: Secondary | ICD-10-CM | POA: Diagnosis not present

## 2021-03-12 DIAGNOSIS — N186 End stage renal disease: Secondary | ICD-10-CM | POA: Diagnosis not present

## 2021-03-12 DIAGNOSIS — D509 Iron deficiency anemia, unspecified: Secondary | ICD-10-CM | POA: Diagnosis not present

## 2021-03-12 DIAGNOSIS — N2581 Secondary hyperparathyroidism of renal origin: Secondary | ICD-10-CM | POA: Diagnosis not present

## 2021-03-13 ENCOUNTER — Other Ambulatory Visit: Payer: Self-pay

## 2021-03-13 DIAGNOSIS — Z0000 Encounter for general adult medical examination without abnormal findings: Secondary | ICD-10-CM

## 2021-03-13 NOTE — Progress Notes (Incomplete)
Virtual Visit via Telephone Note  I connected with  Kenneth [name] on 03/13/21 at  3:15 PM EDT by telephone and verified that I am speaking with the correct person using two identifiers.  Location: Patient: Home Provider: Office Persons participating in the virtual visit: patient/Nurse Health Advisor   I discussed the limitations, risks, security and privacy concerns of performing an evaluation and management service by telephone and the availability of in person appointments. The patient expressed understanding and agreed to proceed.  Interactive audio and video telecommunications were attempted between this nurse and patient, however failed, due to patient having technical difficulties OR patient did not have access to video capability.  We continued and completed visit with audio only.  Some vital signs may be absent or patient reported.   Willette Brace, LPN    Subjective:   Kenneth [name] is a 65 y.o. male who presents for an Initial Medicare Annual Wellness Visit.  Review of Systems           Objective:    There were no vitals filed for this visit. There is no height or weight on file to calculate BMI.  Advanced Directives 01/31/2021 12/18/2020 11/27/2020 11/19/2020 10/27/2020 10/16/2020 10/04/2020  Does Patient Have a Medical Advance Directive? No No No No No No No  Would patient like information on creating a medical advance directive? No - Patient declined No - Patient declined No - Patient declined No - Patient declined No - Patient declined No - Patient declined No - Patient declined  Pre-existing out of facility DNR order (yellow form or pink MOST form) - - - - - - -    Current Medications (verified) Outpatient Encounter Medications as of 03/13/2021  Medication Sig  . amLODipine (NORVASC) 10 MG tablet Take 0.5 tablets (5 mg total) by mouth daily.  Marland Kitchen aspirin 81 MG EC tablet TAKE 1 TABLET (81 MG TOTAL) BY MOUTH DAILY. SWALLOW WHOLE. (Patient taking differently: Take by  mouth 5 (five) times daily. SWALLOW WHOLE.)  . atorvastatin (LIPITOR) 80 MG tablet TAKE 1 TABLET (80 MG TOTAL) BY MOUTH AT BEDTIME. (Patient taking differently: Take 80 mg by mouth at bedtime.)  . B Complex-C-Folic Acid (RENA-VITE RX) 1 MG TABS Take 1 tablet by mouth Every Tuesday,Thursday,and Saturday with dialysis.  . Cinacalcet HCl (SENSIPAR PO) Take by mouth.  . clopidogrel (PLAVIX) 75 MG tablet TAKE 1 TABLET (75 MG TOTAL) BY MOUTH EVERY EVENING. (Patient taking differently: Take 75 mg by mouth every evening.)  . diphenhydramine-acetaminophen (TYLENOL PM EXTRA STRENGTH) 25-500 MG TABS tablet Take 2 tablets by mouth at bedtime as needed (pain).  Marland Kitchen doxycycline (VIBRA-TABS) 100 MG tablet Take 200 mg by mouth 2 (two) times daily.  . famotidine (PEPCID) 20 MG tablet Take 1 tablet (20 mg total) by mouth 2 (two) times daily as needed for heartburn. (Patient taking differently: Take 20 mg by mouth daily.)  . ibuprofen (ADVIL) 200 MG tablet Take 400 mg by mouth every 6 (six) hours as needed for mild pain or headache.  . isosorbide mononitrate (IMDUR) 30 MG 24 hr tablet TAKE 3 TABLETS (90 MG TOTAL) BY MOUTH DAILY. (Patient taking differently: Take 90 mg by mouth daily.)  . lanthanum (FOSRENOL) 1000 MG chewable tablet Chew 1,000 mg by mouth 3 (three) times daily with meals.  . metoprolol succinate (TOPROL-XL) 25 MG 24 hr tablet Take 1 tablet (25 mg total) by mouth in the morning and at bedtime.  . nitroGLYCERIN (NITROSTAT) 0.4 MG SL tablet  PLACE 1 TABLET (0.4 MG TOTAL) UNDER THE TONGUE EVERY FIVE MINUTES AS NEEDED FOR CHEST PAIN (MAX 3 DOSES). (Patient taking differently: Place 0.4 mg under the tongue every 5 (five) minutes as needed for chest pain.)  . pantoprazole (PROTONIX) 40 MG tablet Take 1 tablet (40 mg total) by mouth daily before breakfast.   No facility-administered encounter medications on file as of 03/13/2021.    Allergies (verified) Patient has no known allergies.   History: Past Medical  History:  Diagnosis Date  . Acute CHF ([place]) 01/2018  . Cardiomyopathy secondary    likely related to HTN heart disease; possibly ETOH related as well  . Chronic combined systolic and diastolic heart failure (HCC)    Echocardiogram 09/22/11: Moderate LVH, EF 99991111, grade 3 diastolic dysfunction, mild MR, moderate to severe LAE, mild RVE, mild to moderate TR, small to moderate pericardial effusion  . Coronary artery disease    not felt to be candidate for CABG 08/2018; medical thearpy recomended due to high risk of PCI   . Dysrhythmia    aflutter 04/2016, afib 02/2018, not felt to be a candidate for anticoagulation due to non-compliance and ETOH  . ESRD (end stage renal disease) on dialysis ([place])    due to hypertensive nephrosclerosis; TTS; [address] (09/01/2018)  . History of alcohol abuse   . Hypertension   . Myocardial infarction [facility])    " mild " per daughter  . PEA (Pulseless electrical activity) ([place])    PEA arrest 03/09/18, treated empirically for hyperkalemia, shock x1 for WCT, given amiodarone, ROSC after 10 min of ACLS   Past Surgical History:  Procedure Laterality Date  . AV FISTULA PLACEMENT Left 05/14/2016   Procedure: LEFT ARM BASILIC VEIN TRANSPOSITION;  Surgeon: Rosetta Posner, MD;  Location: [facility];  Service: Vascular;  Laterality: Left;  . INSERTION OF DIALYSIS CATHETER N/A 06/20/2019   Procedure: INSERTION OF TUNNELED  DIALYSIS CATHETER;  Surgeon: Elam Dutch, MD;  Location: [facility] OR;  Service: Vascular;  Laterality: N/A;  . IR AV DIALY SHUNT INTRO NEEDLE/INTRACATH INITIAL W/PTA/IMG LEFT  01/31/2021  . LEFT HEART CATH AND CORONARY ANGIOGRAPHY N/A 09/02/2018   Procedure: LEFT HEART CATH AND CORONARY ANGIOGRAPHY;  Surgeon: Nelva Bush, MD;  Location: [place] CV [facility];  Service: Cardiovascular;  Laterality: N/A;  . LEFT HEART CATH AND CORONARY ANGIOGRAPHY N/A 05/14/2020   Procedure: LEFT HEART CATH AND CORONARY ANGIOGRAPHY;  Surgeon: Sherren Mocha, MD;  Location: [place] [facility];  Service: Cardiovascular;  Laterality: N/A;  . PERIPHERAL VASCULAR CATHETERIZATION N/A 05/13/2016   Procedure: Dialysis/Perma Catheter Insertion;  Surgeon: Serafina Mitchell, MD;  Location: [place] [facility];  Service: Cardiovascular;  Laterality: N/A;  . REVISION OF ARTERIOVENOUS GORETEX GRAFT Left 0000000   Procedure: PLICATION OF ARTERIOVENOUS FISTULA LEFT ARM;  Surgeon: Elam Dutch, MD;  Location: [facility] OR;  Service: Vascular;  Laterality: Left;   Family History  Problem Relation Age of Onset  . Emphysema Mother   . Cirrhosis Father    Social History   Socioeconomic History  . Marital status: Divorced    Spouse name: Not on file  . Number of children: Not on file  . Years of education: Not on file  . Highest education level: Not on file  Occupational History  . Occupation: retired  Tobacco Use  . Smoking status: Former Smoker    Packs/day: 2.00    Years: 48.00    Pack years: 96.00    Types: Cigars  Quit date: 10/27/2017    Years since quitting: 3.3  . Smokeless tobacco: Former Systems developer    Types: Secondary school teacher  . Vaping Use: Never used  Substance and Sexual Activity  . Alcohol use: Not Currently    Comment: h/o very heavy use, quit about 2019  . Drug use: No  . Sexual activity: Not Currently  Other Topics Concern  . Not on file  Social History Narrative  . Not on file   Social Determinants of Health   Financial Resource Strain: Not on file  Food Insecurity: Not on file  Transportation Needs: Not on file  Physical Activity: Not on file  Stress: Not on file  Social Connections: Not on file    Tobacco Counseling Counseling given: Not Answered   Clinical Intake:                 Diabetic?no         Activities of Daily Living In your present state of health, do you have any difficulty performing the following activities: 12/18/2020 11/28/2020  Hearing? Tempie Donning  Vision? N N  Difficulty concentrating or making decisions? N N   Walking or climbing stairs? N N  Dressing or bathing? N N  Doing errands, shopping? N N  Some recent data might be hidden    Patient Care Team: [name], MD as PCP - General (Family Medicine) [name], [name], MD as PCP - Cardiology (Cardiology) Verl Blalock, Marijo Conception, MD (Inactive) as Consulting Physician (Cardiology) Salem Senate, MD as Consulting Physician (Nephrology)  Indicate any recent Medical Services you may have received from other than [facility] providers in the past year (date may be approximate).     Assessment:   This is a routine wellness examination for Kenneth [name].  Hearing/Vision screen No exam data present  Dietary issues and exercise activities discussed:    Goals Addressed   None    Depression Screen No flowsheet data found.  Fall Risk No flowsheet data found.  FALL RISK PREVENTION PERTAINING TO THE HOME:  Any stairs in or around the home? {YES/NO:21197} If so, are there any without handrails? {YES/NO:21197} Home free of loose throw rugs in walkways, pet beds, electrical cords, etc? Yes  Adequate lighting in your home to reduce risk of falls? Yes   ASSISTIVE DEVICES UTILIZED TO PREVENT FALLS:  Life alert? {YES/NO:21197} Use of a cane, walker or w/c? {YES/NO:21197} Grab bars in the bathroom? {YES/NO:21197} Shower chair or bench in shower? {YES/NO:21197} Elevated toilet seat or a handicapped toilet? {YES/NO:21197}  TIMED UP AND GO:  Was the test performed? No .    Cognitive Function:        Immunizations Immunization History  Administered Date(s) Administered  . Hepatitis B, adult 06/07/2016, 07/12/2016, 08/09/2016, 02/09/2020, 03/08/2020  . Hepb-cpg 04/05/2020, 08/09/2020  . Influenza Split 09/22/2011  . Influenza, High Dose Seasonal PF 07/30/2019, 07/30/2019, 09/06/2020  . Moderna Sars-Covid-2 Vaccination 12/22/2019, 01/19/2020  . Pneumococcal Conjugate-13 09/13/2016  . Pneumococcal Polysaccharide-23 12/04/2016, 05/12/2018    TDAP  status: Due, Education has been provided regarding the importance of this vaccine. Advised may receive this vaccine at local pharmacy or Health Dept. Aware to provide a copy of the vaccination record if obtained from local pharmacy or Health Dept. Verbalized acceptance and understanding.  Flu Vaccine status: Up to date  Pneumococcal vaccine status: Up to date  Covid-19 vaccine status: Completed vaccines  Qualifies for Shingles Vaccine? Yes   Zostavax completed No   Shingrix Completed?: No.  Education has been provided regarding the importance of this vaccine. Patient has been advised to call insurance company to determine out of pocket expense if they have not yet received this vaccine. Advised may also receive vaccine at local pharmacy or Health Dept. Verbalized acceptance and understanding.  Screening Tests Health Maintenance  Topic Date Due  . TETANUS/TDAP  Never done  . COLONOSCOPY (Pts 45-83yr Insurance coverage will need to be confirmed)  Never done  . COVID-19 Vaccine (3 - Booster for Moderna series) 06/20/2020  . INFLUENZA VACCINE  05/27/2021  . PNA vac Low Risk Adult (2 of 2 - PPSV23) 05/13/2023  . Hepatitis C Screening  Completed  . HPV VACCINES  Aged Out    Health Maintenance  Health Maintenance Due  Topic Date Due  . TETANUS/TDAP  Never done  . COLONOSCOPY (Pts 45-434yrInsurance coverage will need to be confirmed)  Never done  . COVID-19 Vaccine (3 - Booster for Moderna series) 06/20/2020    {Colorectal cancer screening:2101809}  Additional Screening:  Hepatitis C Screening:  Completed 05/13/16  Vision Screening: Recommended annual ophthalmology exams for early detection of glaucoma and other disorders of the eye. Is the patient up to date with their annual eye exam?  {YES/NO:21197} Who is the provider or what is the name of the office in which the patient attends annual eye exams? *** If pt is not established with a provider, would they like to be referred to  a provider to establish care? {YES/NO:21197}.   Dental Screening: Recommended annual dental exams for proper oral hygiene  Community Resource Referral / Chronic Care Management: CRR required this visit?  {YES/NO:21197}  CCM required this visit?  {YES/NO:21197}     Plan:     I have personally reviewed and noted the following in the patient's chart:   . Medical and social history . Use of alcohol, tobacco or illicit drugs  . Current medications and supplements including opioid prescriptions. {Opioid [phone]} . Functional ability and status . Nutritional status . Physical activity . Advanced directives . List of other physicians . Hospitalizations, surgeries, and ER visits in previous 12 months . Vitals . Screenings to include cognitive, depression, and falls . Referrals and appointments  In addition, I have reviewed and discussed with patient certain preventive protocols, quality metrics, and best practice recommendations. A written personalized care plan for preventive services as well as general preventive health recommendations were provided to patient.     TiWillette BraceLPN   [id number] Nurse Notes: ***

## 2021-03-14 DIAGNOSIS — N2581 Secondary hyperparathyroidism of renal origin: Secondary | ICD-10-CM | POA: Diagnosis not present

## 2021-03-14 DIAGNOSIS — N186 End stage renal disease: Secondary | ICD-10-CM | POA: Diagnosis not present

## 2021-03-14 DIAGNOSIS — Z992 Dependence on renal dialysis: Secondary | ICD-10-CM | POA: Diagnosis not present

## 2021-03-14 DIAGNOSIS — D509 Iron deficiency anemia, unspecified: Secondary | ICD-10-CM | POA: Diagnosis not present

## 2021-03-14 DIAGNOSIS — D631 Anemia in chronic kidney disease: Secondary | ICD-10-CM | POA: Diagnosis not present

## 2021-03-15 ENCOUNTER — Telehealth: Payer: Self-pay | Admitting: Cardiology

## 2021-03-15 NOTE — Telephone Encounter (Signed)
New message     *STAT* If patient is at the pharmacy, call can be transferred to refill team.   1. Which medications need to be refilled? (please list name of each medication and dose if known)  nitroglycerin  2. Which pharmacy/location (including street and city if local pharmacy) is medication to be sent to? [facility] at [address]  3. Do they need a 30 day or 90 day supply? [place]

## 2021-03-18 MED ORDER — NITROGLYCERIN 0.4 MG SL SUBL: SUBLINGUAL_TABLET | SUBLINGUAL | 1 refills | Status: AC

## 2021-03-19 DIAGNOSIS — Z992 Dependence on renal dialysis: Secondary | ICD-10-CM | POA: Diagnosis not present

## 2021-03-19 DIAGNOSIS — D509 Iron deficiency anemia, unspecified: Secondary | ICD-10-CM | POA: Diagnosis not present

## 2021-03-19 DIAGNOSIS — N186 End stage renal disease: Secondary | ICD-10-CM | POA: Diagnosis not present

## 2021-03-19 DIAGNOSIS — D631 Anemia in chronic kidney disease: Secondary | ICD-10-CM | POA: Diagnosis not present

## 2021-03-19 DIAGNOSIS — N2581 Secondary hyperparathyroidism of renal origin: Secondary | ICD-10-CM | POA: Diagnosis not present

## 2021-03-21 DIAGNOSIS — N2581 Secondary hyperparathyroidism of renal origin: Secondary | ICD-10-CM | POA: Diagnosis not present

## 2021-03-21 DIAGNOSIS — D509 Iron deficiency anemia, unspecified: Secondary | ICD-10-CM | POA: Diagnosis not present

## 2021-03-21 DIAGNOSIS — N186 End stage renal disease: Secondary | ICD-10-CM | POA: Diagnosis not present

## 2021-03-21 DIAGNOSIS — D631 Anemia in chronic kidney disease: Secondary | ICD-10-CM | POA: Diagnosis not present

## 2021-03-21 DIAGNOSIS — Z992 Dependence on renal dialysis: Secondary | ICD-10-CM | POA: Diagnosis not present

## 2021-03-23 DIAGNOSIS — N186 End stage renal disease: Secondary | ICD-10-CM | POA: Diagnosis not present

## 2021-03-23 DIAGNOSIS — D509 Iron deficiency anemia, unspecified: Secondary | ICD-10-CM | POA: Diagnosis not present

## 2021-03-23 DIAGNOSIS — Z992 Dependence on renal dialysis: Secondary | ICD-10-CM | POA: Diagnosis not present

## 2021-03-23 DIAGNOSIS — D631 Anemia in chronic kidney disease: Secondary | ICD-10-CM | POA: Diagnosis not present

## 2021-03-23 DIAGNOSIS — N2581 Secondary hyperparathyroidism of renal origin: Secondary | ICD-10-CM | POA: Diagnosis not present

## 2021-03-28 DIAGNOSIS — Z992 Dependence on renal dialysis: Secondary | ICD-10-CM | POA: Diagnosis not present

## 2021-03-28 DIAGNOSIS — D631 Anemia in chronic kidney disease: Secondary | ICD-10-CM | POA: Diagnosis not present

## 2021-03-28 DIAGNOSIS — N186 End stage renal disease: Secondary | ICD-10-CM | POA: Diagnosis not present

## 2021-03-28 DIAGNOSIS — D509 Iron deficiency anemia, unspecified: Secondary | ICD-10-CM | POA: Diagnosis not present

## 2021-03-28 DIAGNOSIS — N2581 Secondary hyperparathyroidism of renal origin: Secondary | ICD-10-CM | POA: Diagnosis not present

## 2021-03-29 ENCOUNTER — Telehealth: Payer: Self-pay | Admitting: Family Medicine

## 2021-03-29 NOTE — Telephone Encounter (Signed)
Left message for patient to call back and schedule Medicare Annual Wellness Visit (AWV) either virtually or in office.   awvi  please schedule at anytime with [facility] Nurse Health Advisor 1 or 2   This should be a 45 minute visit.

## 2021-03-30 DIAGNOSIS — N186 End stage renal disease: Secondary | ICD-10-CM | POA: Diagnosis not present

## 2021-03-30 DIAGNOSIS — D509 Iron deficiency anemia, unspecified: Secondary | ICD-10-CM | POA: Diagnosis not present

## 2021-03-30 DIAGNOSIS — D631 Anemia in chronic kidney disease: Secondary | ICD-10-CM | POA: Diagnosis not present

## 2021-03-30 DIAGNOSIS — N2581 Secondary hyperparathyroidism of renal origin: Secondary | ICD-10-CM | POA: Diagnosis not present

## 2021-03-30 DIAGNOSIS — Z992 Dependence on renal dialysis: Secondary | ICD-10-CM | POA: Diagnosis not present

## 2021-04-04 DIAGNOSIS — D509 Iron deficiency anemia, unspecified: Secondary | ICD-10-CM | POA: Diagnosis not present

## 2021-04-04 DIAGNOSIS — N186 End stage renal disease: Secondary | ICD-10-CM | POA: Diagnosis not present

## 2021-04-04 DIAGNOSIS — D631 Anemia in chronic kidney disease: Secondary | ICD-10-CM | POA: Diagnosis not present

## 2021-04-04 DIAGNOSIS — Z992 Dependence on renal dialysis: Secondary | ICD-10-CM | POA: Diagnosis not present

## 2021-04-04 DIAGNOSIS — N2581 Secondary hyperparathyroidism of renal origin: Secondary | ICD-10-CM | POA: Diagnosis not present

## 2021-04-06 DIAGNOSIS — Z992 Dependence on renal dialysis: Secondary | ICD-10-CM | POA: Diagnosis not present

## 2021-04-06 DIAGNOSIS — N186 End stage renal disease: Secondary | ICD-10-CM | POA: Diagnosis not present

## 2021-04-06 DIAGNOSIS — N2581 Secondary hyperparathyroidism of renal origin: Secondary | ICD-10-CM | POA: Diagnosis not present

## 2021-04-06 DIAGNOSIS — D509 Iron deficiency anemia, unspecified: Secondary | ICD-10-CM | POA: Diagnosis not present

## 2021-04-06 DIAGNOSIS — D631 Anemia in chronic kidney disease: Secondary | ICD-10-CM | POA: Diagnosis not present

## 2021-04-09 DIAGNOSIS — Z992 Dependence on renal dialysis: Secondary | ICD-10-CM | POA: Diagnosis not present

## 2021-04-09 DIAGNOSIS — N2581 Secondary hyperparathyroidism of renal origin: Secondary | ICD-10-CM | POA: Diagnosis not present

## 2021-04-09 DIAGNOSIS — D631 Anemia in chronic kidney disease: Secondary | ICD-10-CM | POA: Diagnosis not present

## 2021-04-09 DIAGNOSIS — N186 End stage renal disease: Secondary | ICD-10-CM | POA: Diagnosis not present

## 2021-04-09 DIAGNOSIS — D509 Iron deficiency anemia, unspecified: Secondary | ICD-10-CM | POA: Diagnosis not present

## 2021-04-13 DIAGNOSIS — D631 Anemia in chronic kidney disease: Secondary | ICD-10-CM | POA: Diagnosis not present

## 2021-04-13 DIAGNOSIS — Z992 Dependence on renal dialysis: Secondary | ICD-10-CM | POA: Diagnosis not present

## 2021-04-13 DIAGNOSIS — N2581 Secondary hyperparathyroidism of renal origin: Secondary | ICD-10-CM | POA: Diagnosis not present

## 2021-04-13 DIAGNOSIS — N186 End stage renal disease: Secondary | ICD-10-CM | POA: Diagnosis not present

## 2021-04-13 DIAGNOSIS — D509 Iron deficiency anemia, unspecified: Secondary | ICD-10-CM | POA: Diagnosis not present

## 2021-04-16 DIAGNOSIS — D631 Anemia in chronic kidney disease: Secondary | ICD-10-CM | POA: Diagnosis not present

## 2021-04-16 DIAGNOSIS — Z992 Dependence on renal dialysis: Secondary | ICD-10-CM | POA: Diagnosis not present

## 2021-04-16 DIAGNOSIS — N2581 Secondary hyperparathyroidism of renal origin: Secondary | ICD-10-CM | POA: Diagnosis not present

## 2021-04-16 DIAGNOSIS — N186 End stage renal disease: Secondary | ICD-10-CM | POA: Diagnosis not present

## 2021-04-16 DIAGNOSIS — D509 Iron deficiency anemia, unspecified: Secondary | ICD-10-CM | POA: Diagnosis not present

## 2021-04-17 ENCOUNTER — Ambulatory Visit: Payer: Medicare Other | Admitting: Podiatry

## 2021-04-18 ENCOUNTER — Emergency Department (HOSPITAL_COMMUNITY): Payer: Medicare Other

## 2021-04-18 ENCOUNTER — Emergency Department (HOSPITAL_COMMUNITY)
Admission: EM | Admit: 2021-04-18 | Discharge: 2021-04-18 | Disposition: A | Discharge status: Home / Self Care | Payer: Medicare Other | Attending: Emergency Medicine | Admitting: Emergency Medicine

## 2021-04-18 DIAGNOSIS — I132 Hypertensive heart and chronic kidney disease with heart failure and with stage 5 chronic kidney disease, or end stage renal disease: Secondary | ICD-10-CM | POA: Insufficient documentation

## 2021-04-18 DIAGNOSIS — I5043 Acute on chronic combined systolic (congestive) and diastolic (congestive) heart failure: Secondary | ICD-10-CM | POA: Diagnosis not present

## 2021-04-18 DIAGNOSIS — D509 Iron deficiency anemia, unspecified: Secondary | ICD-10-CM | POA: Diagnosis not present

## 2021-04-18 DIAGNOSIS — I480 Paroxysmal atrial fibrillation: Secondary | ICD-10-CM | POA: Insufficient documentation

## 2021-04-18 DIAGNOSIS — Z79899 Other long term (current) drug therapy: Secondary | ICD-10-CM | POA: Diagnosis not present

## 2021-04-18 DIAGNOSIS — N186 End stage renal disease: Secondary | ICD-10-CM | POA: Insufficient documentation

## 2021-04-18 DIAGNOSIS — Z87891 Personal history of nicotine dependence: Secondary | ICD-10-CM | POA: Insufficient documentation

## 2021-04-18 DIAGNOSIS — N2581 Secondary hyperparathyroidism of renal origin: Secondary | ICD-10-CM | POA: Diagnosis not present

## 2021-04-18 DIAGNOSIS — Z7902 Long term (current) use of antithrombotics/antiplatelets: Secondary | ICD-10-CM | POA: Diagnosis not present

## 2021-04-18 DIAGNOSIS — Z7982 Long term (current) use of aspirin: Secondary | ICD-10-CM | POA: Diagnosis not present

## 2021-04-18 DIAGNOSIS — Z992 Dependence on renal dialysis: Secondary | ICD-10-CM | POA: Diagnosis not present

## 2021-04-18 DIAGNOSIS — I10 Essential (primary) hypertension: Secondary | ICD-10-CM | POA: Diagnosis not present

## 2021-04-18 DIAGNOSIS — R072 Precordial pain: Secondary | ICD-10-CM | POA: Diagnosis not present

## 2021-04-18 DIAGNOSIS — I2510 Atherosclerotic heart disease of native coronary artery without angina pectoris: Secondary | ICD-10-CM | POA: Diagnosis not present

## 2021-04-18 DIAGNOSIS — R000 Tachycardia, unspecified: Secondary | ICD-10-CM | POA: Diagnosis not present

## 2021-04-18 DIAGNOSIS — R079 Chest pain, unspecified: Secondary | ICD-10-CM

## 2021-04-18 DIAGNOSIS — R0602 Shortness of breath: Secondary | ICD-10-CM | POA: Diagnosis not present

## 2021-04-18 DIAGNOSIS — R0902 Hypoxemia: Secondary | ICD-10-CM | POA: Diagnosis not present

## 2021-04-18 DIAGNOSIS — I509 Heart failure, unspecified: Secondary | ICD-10-CM | POA: Diagnosis not present

## 2021-04-18 DIAGNOSIS — R0789 Other chest pain: Secondary | ICD-10-CM | POA: Diagnosis not present

## 2021-04-18 DIAGNOSIS — I491 Atrial premature depolarization: Secondary | ICD-10-CM | POA: Diagnosis not present

## 2021-04-18 DIAGNOSIS — D631 Anemia in chronic kidney disease: Secondary | ICD-10-CM | POA: Diagnosis not present

## 2021-04-18 LAB — BASIC METABOLIC PANEL
Anion gap: 13 (ref 5–15)
BUN: 44 mg/dL — ABNORMAL HIGH (ref 8–23)
CO2: 29 mmol/L (ref 22–32)
Calcium: 7.8 mg/dL — ABNORMAL LOW (ref 8.9–10.3)
Chloride: 97 mmol/L — ABNORMAL LOW (ref 98–111)
Creatinine, Ser: 10.06 mg/dL — ABNORMAL HIGH (ref 0.61–1.24)
GFR, Estimated: 5 mL/min — ABNORMAL LOW (ref >60)
Glucose, Bld: 86 mg/dL (ref 70–99)
Potassium: 3.6 mmol/L (ref 3.5–5.1)
Sodium: 139 mmol/L (ref 135–145)

## 2021-04-18 LAB — CBC WITH DIFFERENTIAL/PLATELET
Abs Immature Granulocytes: 0.02 10*3/uL (ref 0.00–0.07)
Basophils Absolute: 0 10*3/uL (ref 0.0–0.1)
Basophils Relative: 1 %
Eosinophils Absolute: 0.5 10*3/uL (ref 0.0–0.5)
Eosinophils Relative: 8 %
HCT: 27.8 % — ABNORMAL LOW (ref 39.0–52.0)
Hemoglobin: 9.4 g/dL — ABNORMAL LOW (ref 13.0–17.0)
Immature Granulocytes: 0 %
Lymphocytes Relative: 18 %
Lymphs Abs: 1 10*3/uL (ref 0.7–4.0)
MCH: 32.2 pg (ref 26.0–34.0)
MCHC: 33.8 g/dL (ref 30.0–36.0)
MCV: 95.2 fL (ref 80.0–100.0)
Monocytes Absolute: 0.9 10*3/uL (ref 0.1–1.0)
Monocytes Relative: 17 %
Neutro Abs: 3.2 10*3/uL (ref 1.7–7.7)
Neutrophils Relative %: 56 %
Platelets: 179 10*3/uL (ref 150–400)
RBC: 2.92 MIL/uL — ABNORMAL LOW (ref 4.22–5.81)
RDW: 18.6 % — ABNORMAL HIGH (ref 11.5–15.5)
WBC: 5.6 10*3/uL (ref 4.0–10.5)
nRBC: 0 % (ref 0.0–0.2)

## 2021-04-18 LAB — TROPONIN I (HIGH SENSITIVITY)
Troponin I (High Sensitivity): 57 ng/L — ABNORMAL HIGH (ref <18)
Troponin I (High Sensitivity): 47 ng/L — ABNORMAL HIGH (ref <18)

## 2021-04-18 MED ORDER — NITROGLYCERIN 0.4 MG SL SUBL
0.4000 mg | SUBLINGUAL_TABLET | Freq: Once | SUBLINGUAL | Status: AC
  Administered 2021-04-18: 0.4 mg via SUBLINGUAL
  Filled 2021-04-18: qty 1

## 2021-04-18 NOTE — Progress Notes (Signed)
AV fisula/graft decaccessed. Manual pressure applied to achieve hemostasis, and pressure bandage secured.

## 2021-04-18 NOTE — Discharge Instructions (Addendum)
If you continue to find yourself taking nitroglycerin several times a day due to chest pain please return for evaluation as discussed.  Follow-up with the cardiologist.

## 2021-04-18 NOTE — ED Provider Notes (Signed)
[facility] EMERGENCY DEPARTMENT Provider Note   CSN: [id number] Arrival date & time: 04/18/21  1603     History Chief Complaint  Patient presents with   Chest Pain    Kenneth [name] is a 65 y.o. male.  The history is provided by the patient.  Chest Pain Pain location:  Substernal area Pain quality: aching   Pain radiates to:  Does not radiate Pain severity:  Mild Onset quality:  Sudden Duration:  5 minutes Progression:  Resolved Chronicity:  Recurrent Context: at rest   Relieved by:  Nitroglycerin Worsened by:  Nothing Associated symptoms: no abdominal pain, no altered mental status, no anorexia, no anxiety, no back pain, no claudication, no cough, no diaphoresis, no dizziness, no fever, no orthopnea, no palpitations, no shortness of breath and no vomiting   Risk factors: coronary artery disease   Risk factors comment:  Esrd     Past Medical History:  Diagnosis Date   Acute CHF ([place]) 01/2018   Cardiomyopathy secondary    likely related to HTN heart disease; possibly ETOH related as well   Chronic combined systolic and diastolic heart failure (HCC)    Echocardiogram 09/22/11: Moderate LVH, EF 99991111, grade 3 diastolic dysfunction, mild MR, moderate to severe LAE, mild RVE, mild to moderate TR, small to moderate pericardial effusion   Coronary artery disease    not felt to be candidate for CABG 08/2018; medical thearpy recomended due to high risk of PCI    Dysrhythmia    aflutter 04/2016, afib 02/2018, not felt to be a candidate for anticoagulation due to non-compliance and ETOH   ESRD (end stage renal disease) on dialysis ([place])    due to hypertensive nephrosclerosis; TTS; Richarda Blade. (09/01/2018)   History of alcohol abuse    Hypertension    Myocardial infarction [facility])    " mild " per daughter   PEA (Pulseless electrical activity) ([place])    PEA arrest 03/09/18, treated empirically for hyperkalemia, shock x1 for WCT, given amiodarone, ROSC after 10 min  of ACLS    Patient Active Problem List   Diagnosis Date Noted   CHF (congestive heart failure) ([place]) 12/18/2020   CHF exacerbation ([place]) 11/28/2020   Heart failure ([place]) 11/27/2020   Fluid overload 11/22/2020   Volume overload 11/19/2020   Chest pain 10/27/2020   PAF (paroxysmal atrial fibrillation) ([place]) 10/27/2020   Troponin level elevated    Angina at rest [facility]) 09/24/2020   Accelerated hypertension 09/24/2020   Dyslipidemia 09/24/2020   NSTEMI (non-ST elevated myocardial infarction) ([place]) 05/12/2020   Non-ST elevated myocardial infarction ([place]) 05/12/2020   Headache, unspecified 05/02/2020   Allergy, unspecified, initial encounter 08/15/2019   DNR (do not resuscitate) discussion    Palliative care by specialist    Goals of care, counseling/discussion    CAD (coronary artery disease)    STEMI (ST elevation myocardial infarction) ([place]) 09/17/2018   Unstable angina (HCC)    ACS (acute coronary syndrome) ([place]) 09/01/2018   Encounter for removal of sutures 08/27/2018   ESRD (end stage renal disease) ([place]) 07/20/2018   Acute hypoxemic respiratory failure ([place]) 05/11/2018   Hypercalcemia 04/15/2018   Cardiac arrest ([place]) 03/09/2018   Dysphagia, unspecified 03/09/2018   Acute encephalopathy    Acute on chronic respiratory failure ([place]) 02/16/2018   Tobacco dependence 02/16/2018   Pulmonary edema 01/26/2018   Acute respiratory failure with hypoxia ([place]) 01/26/2018   Atrial fibrillation and flutter (HCC)    Shortness of breath  Acute on chronic combined systolic and diastolic heart failure ([place])    Hypothermia 10/08/2016   ESRD on hemodialysis ([place]) 10/08/2016   Fall 10/08/2016   Syncope 10/07/2016   Unspecified protein-calorie malnutrition ([place]) 07/15/2016   Near syncope 07/07/2016   Cardiomyopathy ([place]) 07/07/2016   Ventricular tachycardia, non-sustained ([place]) 07/06/2016   Hypoglycemia 07/06/2016   Other abnormal glucose 06/02/2016   Aftercare including intermittent  dialysis ([place]) 05/24/2016   Anemia in chronic kidney disease [id number]   Complication of vascular dialysis catheter 05/24/2016   Diarrhea, unspecified 05/24/2016   Fever, unspecified 05/24/2016   Iron deficiency anemia, unspecified 05/24/2016   Other specified coagulation defects ([place]) 05/24/2016   Pruritus, unspecified 05/24/2016   Secondary hyperparathyroidism of renal origin ([place]) 05/24/2016   CKD (chronic kidney disease) stage 5, GFR less than 15 ml/min ([place])    Alcoholism ([place]) 05/13/2016   Hypertensive emergency 12/08/2013   Cardiomyopathy secondary 10/09/2011   Essential hypertension 10/09/2011   Alcohol withdrawal delirium ([place]) 09/25/2011   Chronic diastolic CHF (congestive heart failure) ([place]) 09/24/2011    Past Surgical History:  Procedure Laterality Date   AV FISTULA PLACEMENT Left 05/14/2016   Procedure: LEFT ARM BASILIC VEIN TRANSPOSITION;  Surgeon: Rosetta Posner, MD;  Location: [place];  Service: Vascular;  Laterality: Left;   INSERTION OF DIALYSIS CATHETER N/A 06/20/2019   Procedure: INSERTION OF TUNNELED  DIALYSIS CATHETER;  Surgeon: Elam Dutch, MD;  Location: [facility] OR;  Service: Vascular;  Laterality: N/A;   IR AV DIALY SHUNT INTRO NEEDLE/INTRACATH INITIAL W/PTA/IMG LEFT  01/31/2021   LEFT HEART CATH AND CORONARY ANGIOGRAPHY N/A 09/02/2018   Procedure: LEFT HEART CATH AND CORONARY ANGIOGRAPHY;  Surgeon: Nelva Bush, MD;  Location: [place] CV LAB;  Service: Cardiovascular;  Laterality: N/A;   LEFT HEART CATH AND CORONARY ANGIOGRAPHY N/A 05/14/2020   Procedure: LEFT HEART CATH AND CORONARY ANGIOGRAPHY;  Surgeon: Sherren Mocha, MD;  Location: [place] CV LAB;  Service: Cardiovascular;  Laterality: N/A;   PERIPHERAL VASCULAR CATHETERIZATION N/A 05/13/2016   Procedure: Dialysis/Perma Catheter Insertion;  Surgeon: Serafina Mitchell, MD;  Location: [place] CV LAB;  Service: Cardiovascular;  Laterality: N/A;   REVISION OF ARTERIOVENOUS GORETEX GRAFT Left 0000000    Procedure: PLICATION OF ARTERIOVENOUS FISTULA LEFT ARM;  Surgeon: Elam Dutch, MD;  Location: [facility] OR;  Service: Vascular;  Laterality: Left;       Family History  Problem Relation Age of Onset   Emphysema Mother    Cirrhosis Father     Social History   Tobacco Use   Smoking status: Former    Packs/day: 2.00    Years: 48.00    Pack years: 96.00    Types: Cigars, Cigarettes    Quit date: 10/27/2017    Years since quitting: 3.4   Smokeless tobacco: Former    Types: Nurse, children's Use: Never used  Substance Use Topics   Alcohol use: Not Currently    Comment: h/o very heavy use, quit about 2019   Drug use: No    Home Medications Prior to Admission medications   Medication Sig Start Date End Date Taking? Authorizing Provider  amLODipine (NORVASC) 10 MG tablet Take 0.5 tablets (5 mg total) by mouth daily. Patient taking differently: Take 10 mg by mouth daily. 12/19/20  Yes Regalado, Belkys A, MD  atorvastatin (LIPITOR) 80 MG tablet TAKE 1 TABLET (80 MG TOTAL) BY MOUTH AT BEDTIME. Patient taking differently: Take 80 mg by mouth at bedtime.  12/19/20 12/19/21 Yes Regalado, Belkys A, MD  B Complex-C-Folic Acid (RENA-VITE RX) 1 MG TABS Take 1 tablet by mouth Every Tuesday,Thursday,and Saturday with dialysis. 10/05/20  Yes [provider]  clopidogrel (PLAVIX) 75 MG tablet TAKE 1 TABLET (75 MG TOTAL) BY MOUTH EVERY EVENING. Patient taking differently: Take 75 mg by mouth every evening. 12/19/20 12/19/21 Yes Regalado, Belkys A, MD  famotidine (PEPCID) 20 MG tablet Take 1 tablet (20 mg total) by mouth 2 (two) times daily as needed for heartburn. Patient taking differently: Take 20 mg by mouth daily. 12/17/20  Yes [name], MD  ibuprofen (ADVIL) 200 MG tablet Take 400 mg by mouth every 6 (six) hours as needed for mild pain or headache.   Yes [provider]  lanthanum (FOSRENOL) 1000 MG chewable tablet Chew 1,000 mg by mouth 3 (three) times daily with  meals. 08/23/20  Yes [provider]  metoprolol succinate (TOPROL-XL) 25 MG 24 hr tablet Take 1 tablet (25 mg total) by mouth in the morning and at bedtime. Patient taking differently: Take 25 mg by mouth See admin instructions. Take on Sunday, Monday, Wednesday, and friday 12/19/20  Yes Regalado, Belkys A, MD  aspirin 81 MG EC tablet TAKE 1 TABLET (81 MG TOTAL) BY MOUTH DAILY. SWALLOW WHOLE. Patient not taking: Reported on 04/18/2021 12/19/20 12/19/21  Regalado, Jerald Kief A, MD  isosorbide mononitrate (IMDUR) 30 MG 24 hr tablet TAKE 3 TABLETS (90 MG TOTAL) BY MOUTH DAILY. Patient taking differently: Take 90 mg by mouth daily. 12/19/20 12/19/21  Regalado, Belkys A, MD  nitroGLYCERIN (NITROSTAT) 0.4 MG SL tablet PLACE 1 TABLET (0.4 MG TOTAL) UNDER THE TONGUE EVERY FIVE MINUTES AS NEEDED FOR CHEST PAIN (MAX 3 DOSES). Patient taking differently: Place 0.4 mg under the tongue every 5 (five) minutes as needed for chest pain. 03/18/21 03/18/22  [name], [name], MD  pantoprazole (PROTONIX) 40 MG tablet Take 1 tablet (40 mg total) by mouth daily before breakfast. Patient not taking: Reported on 04/18/2021 12/12/20   [name], MD    Allergies    Patient has no known allergies.  Review of Systems   Review of Systems  Constitutional:  Negative for chills, diaphoresis and fever.  HENT:  Negative for ear pain and sore throat.   Eyes:  Negative for pain and visual disturbance.  Respiratory:  Negative for cough and shortness of breath.   Cardiovascular:  Positive for chest pain. Negative for palpitations, orthopnea and claudication.  Gastrointestinal:  Negative for abdominal pain, anorexia and vomiting.  Genitourinary:  Negative for dysuria and hematuria.  Musculoskeletal:  Negative for arthralgias and back pain.  Skin:  Negative for color change and rash.  Neurological:  Negative for dizziness, seizures and syncope.  All other systems reviewed and are negative.  Physical Exam Updated Vital  Signs  ED Triage Vitals  Enc Vitals Group     BP 04/18/21 1630 (!) 142/80     Pulse Rate 04/18/21 1630 83     Resp 04/18/21 1630 (!) 21     Temp 04/18/21 1751 (!) 96.8 F (36 C)     Temp Source 04/18/21 1751 Rectal     SpO2 04/18/21 1607 98 %     Weight --      Height --      Head Circumference --      Peak Flow --      Pain Score 04/18/21 1610 2     Pain Loc --  Pain Edu? --      Excl. in [place]? --      Physical Exam Vitals and nursing note reviewed.  Constitutional:      General: He is not in acute distress.    Appearance: He is well-developed. He is not ill-appearing.  HENT:     Head: Normocephalic and atraumatic.  Eyes:     Conjunctiva/sclera: Conjunctivae normal.     Pupils: Pupils are equal, round, and reactive to light.  Cardiovascular:     Rate and Rhythm: Normal rate and regular rhythm.     Heart sounds: Normal heart sounds. No murmur heard. Pulmonary:     Effort: Pulmonary effort is normal. No respiratory distress.     Breath sounds: Normal breath sounds.  Abdominal:     Palpations: Abdomen is soft.     Tenderness: There is no abdominal tenderness.  Musculoskeletal:        General: Normal range of motion.     Cervical back: Normal range of motion and neck supple.  Skin:    General: Skin is warm and dry.     Capillary Refill: Capillary refill takes less than 2 seconds.  Neurological:     General: No focal deficit present.     Mental Status: He is alert.     Comments: 5+/5 strength, normal sensation, normal finger to nose figner  Psychiatric:        Mood and Affect: Mood normal.    ED Results / Procedures / Treatments   Labs (all labs ordered are listed, but only abnormal results are displayed) Labs Reviewed  CBC WITH DIFFERENTIAL/PLATELET - Abnormal; Notable for the following components:      Result Value   RBC 2.92 (*)    Hemoglobin 9.4 (*)    HCT 27.8 (*)    RDW 18.6 (*)    All other components within normal limits  BASIC METABOLIC PANEL -  Abnormal; Notable for the following components:   Chloride 97 (*)    BUN 44 (*)    Creatinine, Ser 10.06 (*)    Calcium 7.8 (*)    GFR, Estimated 5 (*)    All other components within normal limits  TROPONIN I (HIGH SENSITIVITY) - Abnormal; Notable for the following components:   Troponin I (High Sensitivity) 57 (*)    All other components within normal limits  TROPONIN I (HIGH SENSITIVITY) - Abnormal; Notable for the following components:   Troponin I (High Sensitivity) 47 (*)    All other components within normal limits    EKG EKG Interpretation  Date/Time:  Thursday April 18 2021 16:06:57 EDT Ventricular Rate:  97 PR Interval:  175 QRS Duration: 117 QT Interval:  344 QTC Calculation: 437 R Axis:   87 Text Interpretation: Sinus or ectopic atrial tachycardia Ventricular bigeminy Low voltage, extremity leads LVH with secondary repolarization abnormality Confirmed by Lennice Sites ([id number]) on 04/18/2021 4:17:37 PM  Radiology DG Chest Portable 1 View  Result Date: 04/18/2021 CLINICAL DATA:  Chest pain EXAM: PORTABLE CHEST 1 VIEW COMPARISON:  12/17/2020 FINDINGS: Cardiac shadow is enlarged but stable. Aortic calcifications are again seen. Vascular congestion and mild interstitial edema is noted. No focal confluent infiltrate is seen. The overall appearance has improved from the prior study however. No bony abnormality is noted. IMPRESSION: Changes of mild CHF although improved when compared with the prior exam. Electronically Signed   By: Inez Catalina M.D.   On: 04/18/2021 17:55    Procedures Procedures   Medications Ordered in  ED Medications  nitroGLYCERIN (NITROSTAT) SL tablet 0.4 mg (0.4 mg Sublingual Given 04/18/21 2131)    ED Course  I have reviewed the triage vital signs and the nursing notes.  Pertinent labs & imaging results that were available during my care of the patient were reviewed by me and considered in my medical decision making (see chart for details).    MDM  Rules/Calculators/A&P                          [name] is here with chest pain while at dialysis.  Normal vitals.  No fever.  Had an episode of chest pain that improved with nitroglycerin while at dialysis.  Chest pain-free now.  Otherwise well-appearing.  EKG shows sinus rhythm.  No new obvious ischemic changes.  Troponin was 57 and 47.  Chest x-ray overall unremarkable.  No significant anemia, electrolyte abnormality otherwise.  Heart cath several years ago showed multivessel severe disease.  Overall he has been fairly chest pain-free recently.  Has not had any exertional chest pain the last several weeks to months.  Use nitroglycerin once 2 weeks ago.  Overall felt better after nitroglycerin today but did need 2 doses which is slightly atypical.  Considered admission for him but patient did not want to stay for any further work-up as he is feeling better.  Talked with cardiology and they will flag him for sooner follow-up.  Initially was going to double his metoprolol dose but overall when looking closer at his prescription patient takes higher doses on nondialysis days and overall we will have him follow-up with cardiology to see if there is any need for adjustment of his chronic anginal medications.  He does state he will come back if he does having to take nitroglycerin several times in a day.  Discharged in good condition.  This chart was dictated using voice recognition software.  Despite best efforts to proofread,  errors can occur which can change the documentation meaning.   Final Clinical Impression(s) / ED Diagnoses Final diagnoses:  Nonspecific chest pain    Rx / DC Orders ED Discharge Orders     None        Lennice Sites, DO 04/18/21 2233

## 2021-04-18 NOTE — ED Triage Notes (Signed)
Pt BIB [facility] from dialysis reporting chest pain. '324mg'$  aspirin and 2 nitro administered en route with improvement. EMS reports unremarkable EKG. Pt administered 1 nitro at dialysis, with improvement, around ~2pm. Pt A&O x4 and ambulatory at this time.Reporting only minor chest discomfort

## 2021-04-23 ENCOUNTER — Emergency Department (HOSPITAL_COMMUNITY)
Admission: EM | Admit: 2021-04-23 | Discharge: 2021-04-26 | Disposition: E | Payer: Medicare Other | Attending: Emergency Medicine | Admitting: Emergency Medicine

## 2021-04-23 DIAGNOSIS — Z87891 Personal history of nicotine dependence: Secondary | ICD-10-CM | POA: Diagnosis not present

## 2021-04-23 DIAGNOSIS — R0689 Other abnormalities of breathing: Secondary | ICD-10-CM | POA: Diagnosis not present

## 2021-04-23 DIAGNOSIS — Z992 Dependence on renal dialysis: Secondary | ICD-10-CM | POA: Insufficient documentation

## 2021-04-23 DIAGNOSIS — Z7902 Long term (current) use of antithrombotics/antiplatelets: Secondary | ICD-10-CM | POA: Diagnosis not present

## 2021-04-23 DIAGNOSIS — N186 End stage renal disease: Secondary | ICD-10-CM | POA: Diagnosis not present

## 2021-04-23 DIAGNOSIS — S0011XA Contusion of right eyelid and periocular area, initial encounter: Secondary | ICD-10-CM | POA: Insufficient documentation

## 2021-04-23 DIAGNOSIS — R000 Tachycardia, unspecified: Secondary | ICD-10-CM | POA: Diagnosis not present

## 2021-04-23 DIAGNOSIS — X58XXXA Exposure to other specified factors, initial encounter: Secondary | ICD-10-CM | POA: Insufficient documentation

## 2021-04-23 DIAGNOSIS — D631 Anemia in chronic kidney disease: Secondary | ICD-10-CM | POA: Diagnosis not present

## 2021-04-23 DIAGNOSIS — R140 Abdominal distension (gaseous): Secondary | ICD-10-CM | POA: Insufficient documentation

## 2021-04-23 DIAGNOSIS — Z79899 Other long term (current) drug therapy: Secondary | ICD-10-CM | POA: Insufficient documentation

## 2021-04-23 DIAGNOSIS — I2510 Atherosclerotic heart disease of native coronary artery without angina pectoris: Secondary | ICD-10-CM | POA: Diagnosis not present

## 2021-04-23 DIAGNOSIS — I469 Cardiac arrest, cause unspecified: Secondary | ICD-10-CM

## 2021-04-23 DIAGNOSIS — R069 Unspecified abnormalities of breathing: Secondary | ICD-10-CM | POA: Diagnosis not present

## 2021-04-23 DIAGNOSIS — I132 Hypertensive heart and chronic kidney disease with heart failure and with stage 5 chronic kidney disease, or end stage renal disease: Secondary | ICD-10-CM | POA: Diagnosis not present

## 2021-04-23 DIAGNOSIS — R609 Edema, unspecified: Secondary | ICD-10-CM | POA: Diagnosis not present

## 2021-04-23 DIAGNOSIS — D509 Iron deficiency anemia, unspecified: Secondary | ICD-10-CM | POA: Diagnosis not present

## 2021-04-23 DIAGNOSIS — N2581 Secondary hyperparathyroidism of renal origin: Secondary | ICD-10-CM | POA: Diagnosis not present

## 2021-04-23 DIAGNOSIS — R404 Transient alteration of awareness: Secondary | ICD-10-CM | POA: Diagnosis not present

## 2021-04-23 DIAGNOSIS — I5043 Acute on chronic combined systolic (congestive) and diastolic (congestive) heart failure: Secondary | ICD-10-CM | POA: Insufficient documentation

## 2021-04-23 DIAGNOSIS — S0591XA Unspecified injury of right eye and orbit, initial encounter: Secondary | ICD-10-CM | POA: Diagnosis present

## 2021-04-23 MED ORDER — SODIUM CHLORIDE 0.9 % IV SOLN
INTRAVENOUS | Status: AC | PRN
  Administered 2021-04-23: 150 mL/h via INTRAVENOUS

## 2021-04-23 MED ORDER — SODIUM BICARBONATE 8.4 % IV SOLN
INTRAVENOUS | Status: AC | PRN
  Administered 2021-04-23: 50 meq via INTRAVENOUS

## 2021-04-23 MED ORDER — AMIODARONE HCL 150 MG/3ML IV SOLN
INTRAVENOUS | Status: AC | PRN
  Administered 2021-04-23: 300 mg via INTRAVENOUS

## 2021-04-23 MED ORDER — EPINEPHRINE 1 MG/10ML IJ SOSY
PREFILLED_SYRINGE | INTRAMUSCULAR | Status: AC | PRN
  Administered 2021-04-23 (×2): 1 mg via INTRAVENOUS

## 2021-04-23 MED ORDER — CALCIUM CHLORIDE 10 % IV SOLN
INTRAVENOUS | Status: AC | PRN
  Administered 2021-04-23: 1 g via INTRAVENOUS

## 2021-04-26 NOTE — Progress Notes (Signed)
Pt. Bagged with 100% with AMBU bag during CPR until code was stopped by Physician.

## 2021-04-26 NOTE — ED Provider Notes (Signed)
[facility] EMERGENCY DEPARTMENT Provider Note   CSN: [id number] Arrival date & time: 2021-05-01  1628     History No chief complaint on file.   Kenneth [name] is a 65 y.o. male. Level 5 caveat due to cardiac arrest. HPI Patient presents with cardiac arrest.  Came from dialysis.  Reportedly had completed his treatment complained of shortness of breath.  Later found and found on the floor with no pulse and not breathing.  Had [facility] airway in place.  Swelling above right eye.  Cervical collar in place.  Had epinephrine x4 calcium and bicarb.  Sugar was 84.  Had around 30 minutes of PEA with no return of vitals upon arrival to ER.    Past Medical History:  Diagnosis Date   Acute CHF ([place]) 01/2018   Cardiomyopathy secondary    likely related to HTN heart disease; possibly ETOH related as well   Chronic combined systolic and diastolic heart failure ([place])    Echocardiogram 09/22/11: Moderate LVH, EF 99991111, grade 3 diastolic dysfunction, mild MR, moderate to severe LAE, mild RVE, mild to moderate TR, small to moderate pericardial effusion   Coronary artery disease    not felt to be candidate for CABG 08/2018; medical thearpy recomended due to high risk of PCI    Dysrhythmia    aflutter 04/2016, afib 02/2018, not felt to be a candidate for anticoagulation due to non-compliance and ETOH   ESRD (end stage renal disease) on dialysis ([place])    due to hypertensive nephrosclerosis; TTS; Richarda Blade. (09/01/2018)   History of alcohol abuse    Hypertension    Myocardial infarction [facility])    " mild " per daughter   PEA (Pulseless electrical activity) ([place])    PEA arrest 03/09/18, treated empirically for hyperkalemia, shock x1 for WCT, given amiodarone, ROSC after 10 min of ACLS    Patient Active Problem List   Diagnosis Date Noted   CHF (congestive heart failure) ([place]) 12/18/2020   CHF exacerbation ([place]) 11/28/2020   Heart failure ([place]) 11/27/2020   Fluid overload 11/22/2020    Volume overload 11/19/2020   Chest pain 10/27/2020   PAF (paroxysmal atrial fibrillation) ([place]) 10/27/2020   Troponin level elevated    Angina at rest [facility]) 09/24/2020   Accelerated hypertension 09/24/2020   Dyslipidemia 09/24/2020   NSTEMI (non-ST elevated myocardial infarction) ([place]) 05/12/2020   Non-ST elevated myocardial infarction ([place]) 05/12/2020   Headache, unspecified 05/02/2020   Allergy, unspecified, initial encounter 08/15/2019   DNR (do not resuscitate) discussion    Palliative care by specialist    Goals of care, counseling/discussion    CAD (coronary artery disease)    STEMI (ST elevation myocardial infarction) ([place]) 09/17/2018   Unstable angina (HCC)    ACS (acute coronary syndrome) ([place]) 09/01/2018   Encounter for removal of sutures 08/27/2018   ESRD (end stage renal disease) ([place]) 07/20/2018   Acute hypoxemic respiratory failure ([place]) 05/11/2018   Hypercalcemia 04/15/2018   Cardiac arrest ([place]) 03/09/2018   Dysphagia, unspecified 03/09/2018   Acute encephalopathy    Acute on chronic respiratory failure ([place]) 02/16/2018   Tobacco dependence 02/16/2018   Pulmonary edema 01/26/2018   Acute respiratory failure with hypoxia ([place]) 01/26/2018   Atrial fibrillation and flutter (HCC)    Shortness of breath    Acute on chronic combined systolic and diastolic heart failure ([place])    Hypothermia 10/08/2016   ESRD on hemodialysis ([place]) 10/08/2016   Fall 10/08/2016   Syncope 10/07/2016  Unspecified protein-calorie malnutrition ([place]) 07/15/2016   Near syncope 07/07/2016   Cardiomyopathy ([place]) 07/07/2016   Ventricular tachycardia, non-sustained ([place]) 07/06/2016   Hypoglycemia 07/06/2016   Other abnormal glucose 06/02/2016   Aftercare including intermittent dialysis ([place]) 05/24/2016   Anemia in chronic kidney disease [id number]   Complication of vascular dialysis catheter 05/24/2016   Diarrhea, unspecified 05/24/2016   Fever, unspecified 05/24/2016   Iron deficiency  anemia, unspecified 05/24/2016   Other specified coagulation defects ([place]) 05/24/2016   Pruritus, unspecified 05/24/2016   Secondary hyperparathyroidism of renal origin ([place]) 05/24/2016   CKD (chronic kidney disease) stage 5, GFR less than 15 ml/min ([place])    Alcoholism ([place]) 05/13/2016   Hypertensive emergency 12/08/2013   Cardiomyopathy secondary 10/09/2011   Essential hypertension 10/09/2011   Alcohol withdrawal delirium ([place]) 09/25/2011   Chronic diastolic CHF (congestive heart failure) ([place]) 09/24/2011    Past Surgical History:  Procedure Laterality Date   AV FISTULA PLACEMENT Left 05/14/2016   Procedure: LEFT ARM BASILIC VEIN TRANSPOSITION;  Surgeon: Rosetta Posner, MD;  Location: [place];  Service: Vascular;  Laterality: Left;   INSERTION OF DIALYSIS CATHETER N/A 06/20/2019   Procedure: INSERTION OF TUNNELED  DIALYSIS CATHETER;  Surgeon: Elam Dutch, MD;  Location: [facility] OR;  Service: Vascular;  Laterality: N/A;   IR AV DIALY SHUNT INTRO NEEDLE/INTRACATH INITIAL W/PTA/IMG LEFT  01/31/2021   LEFT HEART CATH AND CORONARY ANGIOGRAPHY N/A 09/02/2018   Procedure: LEFT HEART CATH AND CORONARY ANGIOGRAPHY;  Surgeon: Nelva Bush, MD;  Location: [place] CV LAB;  Service: Cardiovascular;  Laterality: N/A;   LEFT HEART CATH AND CORONARY ANGIOGRAPHY N/A 05/14/2020   Procedure: LEFT HEART CATH AND CORONARY ANGIOGRAPHY;  Surgeon: Sherren Mocha, MD;  Location: [place] [facility];  Service: Cardiovascular;  Laterality: N/A;   PERIPHERAL VASCULAR CATHETERIZATION N/A 05/13/2016   Procedure: Dialysis/Perma Catheter Insertion;  Surgeon: Serafina Mitchell, MD;  Location: [place] CV LAB;  Service: Cardiovascular;  Laterality: N/A;   REVISION OF ARTERIOVENOUS GORETEX GRAFT Left [id number]   Procedure: PLICATION OF ARTERIOVENOUS FISTULA LEFT ARM;  Surgeon: Elam Dutch, MD;  Location[facility];  Service: Vascular;  Laterality: Left;       Family History  Problem Relation Age of Onset    Emphysema Mother    Cirrhosis Father     Social History   Tobacco Use   Smoking status: Former    Packs/day: 2.00    Years: 48.00    Pack years: 96.00    Types: Cigars, Cigarettes    Quit date: 10/27/2017    Years since quitting: 3.4   Smokeless tobacco: Former    Types: Nurse, children's Use: Never used  Substance Use Topics   Alcohol use: Not Currently    Comment: h/o very heavy use, quit about 2019   Drug use: No    Home Medications Prior to Admission medications   Medication Sig Start Date End Date Taking? Authorizing Provider  amLODipine (NORVASC) 10 MG tablet Take 0.5 tablets (5 mg total) by mouth daily. Patient taking differently: Take 10 mg by mouth daily. 12/19/20   Regalado, Belkys A, MD  aspirin 81 MG EC tablet TAKE 1 TABLET (81 MG TOTAL) BY MOUTH DAILY. SWALLOW WHOLE. Patient not taking: Reported on 04/18/2021 12/19/20 12/19/21  Regalado, Jerald Kief A, MD  atorvastatin (LIPITOR) 80 MG tablet TAKE 1 TABLET (80 MG TOTAL) BY MOUTH AT BEDTIME. Patient taking differently: Take 80 mg by mouth at bedtime. 12/19/20 12/19/21  Regalado, Belkys A, MD  B Complex-C-Folic Acid (RENA-VITE RX) 1 MG TABS Take 1 tablet by mouth Every Tuesday,Thursday,and Saturday with dialysis. 10/05/20   [provider]  clopidogrel (PLAVIX) 75 MG tablet TAKE 1 TABLET (75 MG TOTAL) BY MOUTH EVERY EVENING. Patient taking differently: Take 75 mg by mouth every evening. 12/19/20 12/19/21  Regalado, Jerald Kief A, MD  famotidine (PEPCID) 20 MG tablet Take 1 tablet (20 mg total) by mouth 2 (two) times daily as needed for heartburn. Patient taking differently: Take 20 mg by mouth daily. 12/17/20   [name], MD  ibuprofen (ADVIL) 200 MG tablet Take 400 mg by mouth every 6 (six) hours as needed for mild pain or headache.    [provider]  isosorbide mononitrate (IMDUR) 30 MG 24 hr tablet TAKE 3 TABLETS (90 MG TOTAL) BY MOUTH DAILY. Patient taking differently: Take 90 mg by mouth daily.  12/19/20 12/19/21  Regalado, Jerald Kief A, MD  lanthanum (FOSRENOL) 1000 MG chewable tablet Chew 1,000 mg by mouth 3 (three) times daily with meals. 08/23/20   [provider]  metoprolol succinate (TOPROL-XL) 25 MG 24 hr tablet Take 1 tablet (25 mg total) by mouth in the morning and at bedtime. Patient taking differently: Take 25 mg by mouth See admin instructions. Take on Sunday, Monday, Wednesday, and friday 12/19/20   Regalado, Belkys A, MD  nitroGLYCERIN (NITROSTAT) 0.4 MG SL tablet PLACE 1 TABLET (0.4 MG TOTAL) UNDER THE TONGUE EVERY FIVE MINUTES AS NEEDED FOR CHEST PAIN (MAX 3 DOSES). Patient taking differently: Place 0.4 mg under the tongue every 5 (five) minutes as needed for chest pain. 03/18/21 03/18/22  [name], [name], MD  pantoprazole (PROTONIX) 40 MG tablet Take 1 tablet (40 mg total) by mouth daily before breakfast. Patient not taking: Reported on 04/18/2021 12/12/20   [name], MD    Allergies    Patient has no known allergies.  Review of Systems   Review of Systems  Unable to perform ROS: Patient unresponsive   Physical Exam Updated Vital Signs Ht '5\' 11"'$  (1.803 m)   Wt 90.7 kg   BMI 27.89 kg/m   Physical Exam Vitals and nursing note reviewed.  Constitutional:      Comments: Patient presents in cardiac arrest.  HENT:     Head:     Comments: Hematoma right periorbital area.    Mouth/Throat:     Mouth: Mucous membranes are moist.  Eyes:     Comments: Pupils unresponsive.  No threat reflex.  Neck:     Comments: Cervical collar in place. Cardiovascular:     Comments: No pulse. Pulmonary:     Comments: King airway in place.  Some stomach contents coming up but also some fluid. Abdominal:     General: There is distension.  Musculoskeletal:        General: No deformity.     Comments: IO line in place right tibia.  Skin:    General: Skin is warm.  Neurological:     Comments: Unresponsive.  No spontaneous breathing.  No corneal or threat reflex.    ED  Results / Procedures / Treatments   Labs (all labs ordered are listed, but only abnormal results are displayed) Labs Reviewed - No data to display  EKG None  Radiology No results found.  Procedures Procedures   Medications Ordered in ED Medications  amiodarone (CORDARONE) injection (300 mg Intravenous Given May 19, 2021 1631)  0.9 %  sodium chloride infusion ( Intravenous Stopped 05-19-21 1652)  EPINEPHrine (ADRENALIN) 1 MG/10ML injection (1 mg Intravenous Given 2021/05/14 1635)  calcium chloride injection (1 g Intravenous Given 14-May-2021 1633)  sodium bicarbonate injection (50 mEq Intravenous Given 05/14/21 1634)    ED Course  I have reviewed the triage vital signs and the nursing notes.  Pertinent labs & imaging results that were available during my care of the patient were reviewed by me and considered in my medical decision making (see chart for details).    MDM Rules/Calculators/A&P                         Patient came from dialysis and cardiac arrest.  History of end-stage renal disease and severe cardiac disease.  Reportedly had completed dialysis and then complained of shortness of breath.  Found down on the ground with the swelling to his forehead and no pulse and not breathing.  CPR been started.  About 30 minutes of CPR without return of vitals upon arrival to the ER.  Had been treated for hypertension came anemia there and again here.  Did go into fine V. fib here.  No improvement with defibrillation.  At 1635 time of death called.  Discussed with medical examiner, Donata Duff.  With the complaint of shortness of breath before collapsing he feels confident this is a medical issue that caused the collapse and not a primary traumatic cause of death.  Not in any case.  I will likely be signing the death certificate.  Attempting to notify family member but the number in the chart was reportedly an old number.   Final Clinical Impression(s) / ED Diagnoses Final diagnoses:  Cardiac  arrest [facility])    Rx / [place] Orders ED Discharge Orders     None        Davonna Belling, MD 05/14/2021 1732

## 2021-04-26 NOTE — ED Triage Notes (Signed)
Patient arrived by [facility] from dialysis center. Patient had completed his treatment and complained of SOB and then found on floor pulseless and apneic. Received the following pta- IO Rtib King airway EPI x 4 Calcium 1 amp Sodium bicar 1 amp NS 300 CBG 84  30 minutes of CPR prior to arrival and PEA/Vif on arrival.

## 2021-04-26 DEATH — deceased

## 2021-05-01 ENCOUNTER — Ambulatory Visit: Payer: Medicare Other | Admitting: Podiatry

## 2021-05-06 ENCOUNTER — Ambulatory Visit (HOSPITAL_BASED_OUTPATIENT_CLINIC_OR_DEPARTMENT_OTHER): Payer: Medicare Other | Admitting: Family

## 2021-08-27 ENCOUNTER — Encounter (INDEPENDENT_AMBULATORY_CARE_PROVIDER_SITE_OTHER): Payer: Self-pay | Admitting: Family Medicine

## 2021-08-27 ENCOUNTER — Ambulatory Visit (INDEPENDENT_AMBULATORY_CARE_PROVIDER_SITE_OTHER): Payer: Medicare (Managed Care) | Admitting: Family Medicine

## 2021-08-27 VITALS — BP 122/74 | HR 65 | Temp 97.9°F | Ht 70.0 in | Wt 208.0 lb

## 2021-08-27 DIAGNOSIS — E7800 Pure hypercholesterolemia, unspecified: Secondary | ICD-10-CM | POA: Insufficient documentation

## 2021-08-27 DIAGNOSIS — Z23 Encounter for immunization: Secondary | ICD-10-CM

## 2021-08-27 DIAGNOSIS — Z125 Encounter for screening for malignant neoplasm of prostate: Secondary | ICD-10-CM

## 2021-08-27 DIAGNOSIS — E782 Mixed hyperlipidemia: Secondary | ICD-10-CM

## 2021-08-27 DIAGNOSIS — E559 Vitamin D deficiency, unspecified: Secondary | ICD-10-CM

## 2021-08-27 DIAGNOSIS — Z0000 Encounter for general adult medical examination without abnormal findings: Secondary | ICD-10-CM

## 2021-08-27 DIAGNOSIS — R748 Abnormal levels of other serum enzymes: Secondary | ICD-10-CM

## 2021-08-27 NOTE — Progress Notes (Signed)
[facility] [place] [facility]                       Date of Exam: 08/27/2021 2:50 PM        Patient ID: Keith Hebert is a 65 y.o. male.  Attending Physician: Sherie Don, MD        Chief Complaint:    Chief Complaint   Patient presents with    Medicare Annual Wellness Visit     FBW/Colonoscopy referral     Immunizations     Prevnar 20. Will get Shingrix and TDAP and pharmacy                HPI:    65 year old male comes in for annual physical and chronic care follow up.  He reports that he has been in overall good general health.      He does not smoke and has never smoked.    He has 1-2 drinks per week.    He is married.    He does maintain a healthy well balanced diet and does stay active with walking regularly.  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      He is up to date with Covid, Tdap, and Pneumovax vaccines.    He got his seasonal flu shot.     He will get his Shingrix at his local pharmacy since he is on Medicare.      He will be getting colonoscopy done as well once the pandemic allows and he feels comfortable to do so.     He would like to gt his Prevnar 20 today.      He has been taking the Crestor without problems.  He has been really working on his diet and exercise as well.             Problem List:    Patient Active Problem List   Diagnosis    Vitamin D deficiency    High triglycerides    Low serum HDL    Elevated LDL cholesterol level             Current Meds:    Outpatient Medications Marked as Taking for the 08/27/21 encounter (Office Visit) with Sherie Don, MD   Medication Sig Dispense Refill    rosuvastatin (CRESTOR) 5 MG tablet TAKE 1 TABLET BY MOUTH EVERY DAY 90 tablet 1          Allergies:    No Known Allergies          Past Surgical History:    Past Surgical History:   Procedure Laterality Date    COLONOSCOPY  2010    age 67; 1 polyp repeat 5 years; has not scheduled f/u yet    HAND SURGERY  1992    L thumb    KNEE  SURGERY  1983    L knee    ORTHOPEDIC SURGERY  1980    LEFT--torn ligaments           Family History:    Family History   Problem Relation Age of Onset    Alzheimer's disease Mother     Lung cancer Father     Lung disease Father     Diabetes Sister     Heart disease Sister            Social History:    Social History  Tobacco Use    Smoking status: Never    Smokeless tobacco: Never   Vaping Use    Vaping Use: Never used   Substance Use Topics    Alcohol use: Yes     Alcohol/week: 2.0 standard drinks     Types: 1 Shots of liquor, 1 Glasses of wine per week     Comment: 1 drink monthly, 1 drink of wine Q6 months    Drug use: Never           The following sections were reviewed this encounter by the provider:   Tobacco  Allergies  Meds  Problems  Med Hx  Surg Hx  Fam Hx             Vital Signs:    BP 122/74 (BP Site: Left arm, Patient Position: Sitting, Cuff Size: Medium)   Pulse 65   Temp 97.9 F (36.6 C) (Tympanic)   Ht 1.778 m (5\' 10" )   Wt 94.3 kg (208 lb)   BMI 29.84 kg/m          ROS:    As per HPI          Physical Exam:    Physical Exam  Constitutional:       Appearance: Normal appearance.   HENT:      Right Ear: Tympanic membrane, ear canal and external ear normal.      Left Ear: Tympanic membrane, ear canal and external ear normal.   Eyes:      General: No scleral icterus.     Conjunctiva/sclera: Conjunctivae normal.      Pupils: Pupils are equal, round, and reactive to light.   Neck:      Vascular: No carotid bruit.   Cardiovascular:      Rate and Rhythm: Normal rate and regular rhythm.      Pulses: Normal pulses.      Heart sounds: Normal heart sounds. No murmur heard.    No gallop.   Pulmonary:      Effort: Pulmonary effort is normal. No respiratory distress.      Breath sounds: Normal breath sounds. No wheezing, rhonchi or rales.   Abdominal:      General: Abdomen is flat. Bowel sounds are normal. There is no distension.      Palpations: Abdomen is soft. There is no mass.      Tenderness:  There is no abdominal tenderness. There is no guarding.      Hernia: No hernia is present.   Musculoskeletal:         General: No swelling or tenderness. Normal range of motion.      Cervical back: Normal range of motion and neck supple. No muscular tenderness.      Right lower leg: No edema.      Left lower leg: No edema.   Lymphadenopathy:      Cervical: No cervical adenopathy.   Skin:     General: Skin is warm and dry.      Coloration: Skin is not jaundiced.      Findings: No rash.   Neurological:      General: No focal deficit present.      Mental Status: He is alert.      Cranial Nerves: No cranial nerve deficit.      Sensory: No sensory deficit.      Motor: No weakness.      Gait: Gait normal.   Psychiatric:  Mood and Affect: Mood normal.         Behavior: Behavior normal.            Assessment:    1. Well adult exam  - Comprehensive metabolic panel  - Lipid panel    2. Mixed hyperlipidemia  - Comprehensive metabolic panel  - Lipid panel    3. Low serum HDL  - Lipid panel    4. Vitamin D deficiency  - Vitamin D,25 OH, Total    5. Prostate cancer screening  - PROSTATE SPECIFIC ANTIGEN SCREEN    6. Need for pneumococcal vaccine  - Pneumococcal Conjugate 20 - [place]          Plan:    AWV: See note.  HLD: Check fasting lipids.  Continue with efforts with healthy low fat/low cholesterol diet along with taking statin medication to keep lipids at goals.  Low HDL: See #2.  Discussed diet and lifestyle changes to help discussed.  Vitamin D deficiency: Check levels and adjust supplementation accordingly.   PSA: prostate cancer screening discussed and all questions answered.  Patient would like to proceed with PSA testing.   Prevnar 20.          Follow-up:    Return in about 1 year (around 08/27/2022) for AWV.         Sherie Don, MD

## 2021-08-27 NOTE — Progress Notes (Signed)
[facility] [place] [facility]                       Date of Exam: 08/27/2021 2:29 PM        Patient ID: Keith Hebert is a 65 y.o. male.  Attending Physician: Sherie Don, MD        Reason for visit:    Patient is here today for a Medicare Wellness visit.     Medicare AWV Note:    LEGAL DOCUMENTS and CODE STATUS:   Advance Directive Received: No     Type of Document Received:  Document has not yet been received due to will provide [place] with copy         Code Status: yet to be determined    Concerns Outside of the Medicare Wellness     HPI:      Issues outside of the Medicare Wellness will be addressed in a separate note.         Health Risk Assessment:     During the past month, how would you rate your general health?:  (P) Very Good  Which of the following tasks can you do without assistance - drive or take the bus alone; shop for groceries or clothes; prepare your own meals; do your own housework/laundry; handle your own finances/pay bills; eat, bathe or get around your home?:  (P) Drive or take the bus alone, Shop for groceries or clothes, Prepare your own meals, Do your own housework/laundry, Handle your own finances/pay bills, Eat, bathe, dress or get around your home  Which of the following problems have you been bothered by in the past month - dizzy when standing up; problems using the phone; feeling tired or fatigued; moderate or severe body pain?: (P) None of these  Do you exercise for about 20 minutes 3 or more days per week?:  (P) Yes  During the past month was someone available to help if you needed and wanted help?  For example, if you felt nervous, lonely, got sick and had to stay in bed, needed someone to talk to, needed help with daily chores or needed help just taking care of yourself.: (P) Yes  Do you always wear a seat belt?: (P) Yes  Do you have any trouble taking medications the way you have been told to take them?: (P) No  Have you been given any information that  can help you with keeping track of your medications?: (P) No  Do you have trouble paying for your medications?: (P) No  Have you been given any information that can help you with hazards in your house, such as scatter rugs, furniture, etc?: (P) No  Do you feel unsteady when standing or walking?: (P) No  Do you worry about falling?: (P) No  Have you fallen two or more times in the past year?: (P) No  Did you suffer any injuries from your falls in the past year?: (P) No      Hospitalizations:   no hospitalizations within past year    Depression Screening:   Performed and documented in screening tab.        Functional Assessment:   Falls Risk:  home does not have throw rugs, poor lighting or a slippery bath tub or shower  Hearing:  hearing within normal limits  Exercise:  Daily walk 3 miles   ADL's:   Bathing - independent   Dressing - independent  Mobility - independent   Transfer - independent   Eating - independent}   Toileting - independent   ADL assistance not needed      Cognitive Function:     Mood/affect: Appropriate  Appearance: neatly groomed, appropriately and adequately nourished  Family member/caregiver input: Not present        AWV Mini-Cog Result:  > 3 points - negative screen for dementia            Care Team:    Patient Care Team:  Sherie Don, MD as PCP - General (Family Medicine)  Patsey Berthold, MD as Consulting Physician (Cardiology)          Problem List:    Patient Active Problem List   Diagnosis    Vitamin D deficiency    High triglycerides    Low serum HDL             Current Meds:    Outpatient Medications Marked as Taking for the 08/27/21 encounter (Office Visit) with Sherie Don, MD   Medication Sig Dispense Refill    rosuvastatin (CRESTOR) 5 MG tablet TAKE 1 TABLET BY MOUTH EVERY DAY 90 tablet 1          Allergies:    No Known Allergies          Past Surgical History/Past Medical History:    Past Surgical History:   Procedure Laterality Date    COLONOSCOPY  2010    age 57; 1 polyp repeat  5 years; has not scheduled f/u yet    HAND SURGERY  1992    L thumb    KNEE SURGERY  1983    L knee    ORTHOPEDIC SURGERY  1980    LEFT--torn ligaments       History reviewed. No pertinent past medical history.        Family History:    Family History   Problem Relation Age of Onset    Alzheimer's disease Mother     Lung cancer Father     Lung disease Father     Diabetes Sister     Heart disease Sister            Social History:    Social History     Tobacco Use    Smoking status: Never    Smokeless tobacco: Never   Vaping Use    Vaping Use: Never used   Substance Use Topics    Alcohol use: Yes     Alcohol/week: 2.0 standard drinks     Types: 1 Shots of liquor, 1 Glasses of wine per week     Comment: 1 drink monthly, 1 drink of wine Q6 months    Drug use: Never           The following sections were reviewed this encounter by the provider:               Vital Signs:      Ht 1.778 m (5\' 10" )   Wt 95.7 kg (211 lb)   BMI 30.28 kg/m          Physical Exam:    General: Patient is non-ill appearing and alert.          Assessment:    Medicare Wellness Visit             Plan:    As part of your wellness benefit, Medicare makes many screening tests available to you at no charge.  A complete list of these tests can be found at their website, [email or web address]. However, many of these tests or recommendations are out of date, or may not apply to you. After careful consideration of your own personal health needs, the following testing is recommended for you:      Preventive Service    Up-to-date (UTD)/Due/Not Applicable (N/A)   Last Done   Medicare Frequency   Body Mass Index   Up-to-date August 27, 2021  (BMI):Body mass index is 30.28 kg/m.   Height:Height: 177.8 cm (5\' 10" )  Weight:Weight: 95.7 kg (211 lb) Annually   Blood Pressure: Up-to-date August 27, 2021        Every 2 yrs, if BP </= 120/80 mm hg  Annually, if BP >120-139/80-89 mm hg   Abdominal Aortic  Aneurysm Screening Not applicable  Once, between the age range of 27-75 and smoked 100+ cigarettes in lifetime   Cholesterol Testing Due Lab Results   Component Value Date    LDL 128 (H) 08/08/2020     Regularly beginning at age 72 with risk factors   Diabetes Screening Due Lab Results   Component Value Date    GLU 99 08/08/2020      If prediabetes, one screening every 6 months  Otherwise, one screening every 12 months with certain risk factors for diabetes   Colorectal Cancer Screening Due  Annually, Fecal Occult Blood Stool (FOBS)  Every 5 yrs, Sigmoidoscopy with FOBS  Every 10 yrs, Colonoscopy  Every 3 yrs, Cologuard   Prostate Cancer Screening Due Lab Results   Component Value Date    PSA 1.2 08/08/2020    Frequency: annually for covered Medicare beneficiaries   Depression Screening Up-to-date August 27, 2021  As necessary for those with risk factors   Sexually Transmitted Diseases (STDs) & HIV Screening Not applicable  As necessary for those with risk factors   Alcohol Misuse Screening Not applicable  As necessary for those with risk factors   Immunizations:   Shingrix is due and Prevnar is due Immunization History   Administered Date(s) Administered    COVID-19 mRNA Vaccine Bivalent Tris-Sucrose 12 Years And Up Booster [employer]) 30 mcg/0.3 mL 07/25/2021    COVID-19 mRNA vaccine 12 years and above [employer]) 30 mcg/0.3 mL 11/27/2019, 12/21/2019, 07/20/2020    COVID-19 mRNA vaccine Tris-Sucrose 12 years and above [employer]) 30 mcg/0.3 mL 02/01/2021    INFLUENZA HIGH DOSE 65 YRS+ Quad 0.7 mL 07/12/2021    Influenza (Im) Preservative Free 08/05/2006, 08/07/2013, 08/06/2016    Influenza quad 6 MOS to 64 YRS (Flulaval/Fluarix) 08/08/2020    Influenza quadrivalent (IM) 6 months & up PRESERVED (Afluria/Fluzone) 07/27/2015, 08/04/2018, 07/19/2019    PPD Test 02/13/2005, 01/25/2014    Pneumococcal 23 valent 07/19/2019    TD ADULT, NOT ABSORBED 07/30/2006    Prevnar 13: 1 dose after age  32  Pneumovax 23: 1 dose 1 year after Prevnar  Influenza: Annually   Advance Directive Up-to-date  Once; update as needed   Medical Nutrition Therapy Up-to-date  As necessary for diabetes or renal disease   Smoking Cessation Counseling Not applicable Counseling given: Not Answered   Frequency: two cessation attempts per year.   Glaucoma Screening Up-to-date  Annually for covered high risk Medicare beneficiaries (one of the following: DM, FHx Glaucoma, African-Americans aged 79+, Hispanic-Americans aged 65+)   Hepatitis C Virus (HCV) Screening Up-to-date  Annually only for high risk behavior  Once if born between [birth year] and 1965 and are  not considered high risk   Lung Cancer Screening Not applicable  Annually if asymptomatic, tobacco smoking history of at least 30 pack-years (one pack-year = smoking one pack per day for one year; 1 pack = 20 cigarettes), and current smoker or one who has quit smoking within the last 15 years     Your major risk factors:       HLD     Recommendations for improvement:    Low cholesterol diet, Exercise, and Weight management     Referrals:    See After Visit Summary orders         No orders of the defined types were placed in this encounter.               Follow-up:    No follow-ups on file.         Sherie Don, MD

## 2021-08-28 LAB — PROSTATE SPECIFIC ANTIGEN SCREEN: Prostate Specific Antigen Screen: 1.139 ng/mL (ref 0.000–4.000)

## 2021-08-28 LAB — LIPID PANEL
Cholesterol / HDL Ratio: 4.6 Index
Cholesterol: 147 mg/dL (ref 0–199)
HDL: 32 mg/dL — ABNORMAL LOW (ref 40–9999)
LDL Calculated: 79 mg/dL (ref 0–99)
Triglycerides: 179 mg/dL — ABNORMAL HIGH (ref 34–149)
VLDL Calculated: 36 mg/dL (ref 10–40)

## 2021-08-28 LAB — VITAMIN D,25 OH,TOTAL: Vitamin D, 25 OH, Total: 46 ng/mL (ref 30–100)

## 2021-08-28 LAB — COMPREHENSIVE METABOLIC PANEL
ALT: 34 U/L (ref 0–55)
AST (SGOT): 25 U/L (ref 5–41)
Albumin/Globulin Ratio: 1.6 (ref 0.9–2.2)
Albumin: 4.2 g/dL (ref 3.5–5.0)
Alkaline Phosphatase: 76 U/L (ref 37–117)
Anion Gap: 9 (ref 5.0–15.0)
BUN: 12 mg/dL (ref 9.0–28.0)
Bilirubin, Total: 0.6 mg/dL (ref 0.2–1.2)
CO2: 26 mEq/L (ref 17–29)
Calcium: 9.2 mg/dL (ref 8.5–10.5)
Chloride: 104 mEq/L (ref 99–111)
Creatinine: 0.9 mg/dL (ref 0.5–1.5)
Globulin: 2.6 g/dL (ref 2.0–3.6)
Glucose: 80 mg/dL (ref 70–100)
Potassium: 4.2 mEq/L (ref 3.5–5.3)
Protein, Total: 6.8 g/dL (ref 6.0–8.3)
Sodium: 139 mEq/L (ref 135–145)

## 2021-08-28 LAB — HEMOLYSIS INDEX: Hemolysis Index: 9 Index (ref 0–24)

## 2021-08-28 LAB — GFR: EGFR: >60

## 2021-08-29 ENCOUNTER — Other Ambulatory Visit (INDEPENDENT_AMBULATORY_CARE_PROVIDER_SITE_OTHER): Payer: Self-pay | Admitting: Family Medicine

## 2021-08-29 DIAGNOSIS — E781 Pure hyperglyceridemia: Secondary | ICD-10-CM

## 2021-08-29 DIAGNOSIS — E7800 Pure hypercholesterolemia, unspecified: Secondary | ICD-10-CM

## 2021-08-29 DIAGNOSIS — R748 Abnormal levels of other serum enzymes: Secondary | ICD-10-CM

## 2021-08-29 MED ORDER — ROSUVASTATIN CALCIUM 5 MG PO TABS
5.0000 mg | ORAL_TABLET | Freq: Every day | ORAL | 3 refills | Status: DC
Start: 2021-08-29 — End: 2022-08-28

## 2022-03-28 ENCOUNTER — Telehealth (INDEPENDENT_AMBULATORY_CARE_PROVIDER_SITE_OTHER): Payer: Medicare (Managed Care) | Admitting: Family Medicine

## 2022-03-28 ENCOUNTER — Encounter (INDEPENDENT_AMBULATORY_CARE_PROVIDER_SITE_OTHER): Payer: Self-pay | Admitting: Family Medicine

## 2022-03-28 DIAGNOSIS — R051 Acute cough: Secondary | ICD-10-CM

## 2022-03-28 DIAGNOSIS — U071 COVID-19: Secondary | ICD-10-CM

## 2022-03-28 MED ORDER — NIRMATRELVIR&RITONAVIR 300/100 20 X 150 MG & 10 X 100MG PO TBPK
3.0000 | ORAL_TABLET | Freq: Two times a day (BID) | ORAL | 0 refills | Status: AC
Start: 2022-03-28 — End: 2022-04-02

## 2022-03-28 NOTE — Progress Notes (Signed)
[facility] [place] [facility]                       Date of Virtual Visit: 03/28/2022 2:43 PM        Patient ID: Keith Hebert is a 66 y.o. male.  Attending Physician: Franne Forts, MD       Telemedicine Eligibility:    State Location:  [x]  [place]  []  [place]  []  [place] []  [place]  []  Other:    Physical Location:  [x]  Home  []         []        []          []  Other:    Patient Identity Verification:  [x]  State Issued ID  []  Insurance Eligibility Check  []  Other:    Physical Address Verification: (for 911)  [x]  Yes  []  No    Personal identity shared with patient:  [x]  Yes  []  No    Education on nature of video visit shared with patient:  [x]  Yes  []  No    Emergency plan agreed upon with patient:  [x]  Yes  []  No    If the patient had not had this virtual visit, what would they have done?  []         []         []        []          []  Other:    Visit terminated since not appropriate for virtual care:  [x]  N/A  []  Reason:         Chief Complaint:    Chief Complaint   Patient presents with    Positive Covid test      Pt took a covid test late Monday with a positive result and is present for aftercare                  HPI:    COVID  -symptoms started 4 days ago  -started with ST  -had chills, subjective fever   -nasal congestion  -cough  -no dyspnea          Problem List:    Patient Active Problem List   Diagnosis    Vitamin D deficiency    High triglycerides    Low serum HDL    Elevated LDL cholesterol level             Current Meds:    Outpatient Medications Marked as Taking for the 03/28/22 encounter (Telemedicine Visit) with Franne Forts, MD   Medication Sig Dispense Refill    rosuvastatin (CRESTOR) 5 MG tablet Take 1 tablet (5 mg) by mouth daily 90 tablet 3          Allergies:    No Known Allergies          Past Surgical History:    Past Surgical History:   Procedure Laterality Date    COLONOSCOPY  2010    age 64; 1 polyp repeat 5 years; has not scheduled f/u  yet    HAND SURGERY  1992    L thumb    KNEE SURGERY  1983    L knee    ORTHOPEDIC SURGERY  1980    LEFT--torn ligaments           Family History:    Family History   Problem Relation Age of Onset    Alzheimer's disease Mother  Lung cancer Father     Lung disease Father     Diabetes Sister     Heart disease Sister            Social History:    Social History     Tobacco Use    Smoking status: Never    Smokeless tobacco: Never   Vaping Use    Vaping status: Never Used   Substance Use Topics    Alcohol use: Yes     Alcohol/week: 2.0 standard drinks of alcohol     Types: 1 Shots of liquor, 1 Glasses of wine per week     Comment: 1 drink monthly, 1 drink of wine Q6 months    Drug use: Never           The following sections were reviewed this encounter by the provider:   Tobacco  Allergies  Meds  Problems  Med Hx  Surg Hx  Fam Hx             Vital Signs:    There were no vitals taken for this visit.         ROS:    Review of Systems   Constitutional:  Positive for chills and fever.   HENT:  Positive for congestion and sore throat.    Respiratory:  Positive for cough. Negative for shortness of breath.    Cardiovascular:  Negative for chest pain.              Physical Exam:    Physical Exam   GENERAL APPEARANCE: alert, in no acute distress, pleasant, well nourished.   HEAD: normal appearance  EYES: no discharge  EARS: normal hearing  PSYCH: appropriate affect, appropriate mood, normal speech, normal attention        Assessment:    1. COVID-19  - nirmatrelvir-ritonavir (PAXLOVID) 20 x 150 MG & 10 x 100MG  dose pack(emergency use authorization); Take 3 tablets by mouth 2 (two) times daily for 5 days The dosage for PAXLOVID is 300 mg nirmatrelvir (two 150 mg tablets) with 100 mg ritonavir (one 100 mg tablet) with all three tablets taken together.  Dispense: 30 tablet; Refill: 0    2. Acute cough            Plan:      Start Paxlovid (patient counseled to hold Crestor).  Continue symptomatic treatment with OTC  medication.  Call if symptoms not improving or worsening.          Follow-up:    No follow-ups on file.         Franne Forts, MD

## 2022-08-28 ENCOUNTER — Ambulatory Visit (INDEPENDENT_AMBULATORY_CARE_PROVIDER_SITE_OTHER): Payer: Medicare (Managed Care) | Admitting: Family Medicine

## 2022-08-28 ENCOUNTER — Encounter (INDEPENDENT_AMBULATORY_CARE_PROVIDER_SITE_OTHER): Payer: Self-pay | Admitting: Family Medicine

## 2022-08-28 VITALS — BP 110/70 | HR 62 | Temp 97.7°F | Ht 70.0 in | Wt 200.0 lb

## 2022-08-28 DIAGNOSIS — Z0000 Encounter for general adult medical examination without abnormal findings: Secondary | ICD-10-CM

## 2022-08-28 DIAGNOSIS — E782 Mixed hyperlipidemia: Secondary | ICD-10-CM

## 2022-08-28 DIAGNOSIS — Z1211 Encounter for screening for malignant neoplasm of colon: Secondary | ICD-10-CM

## 2022-08-28 DIAGNOSIS — Z125 Encounter for screening for malignant neoplasm of prostate: Secondary | ICD-10-CM

## 2022-08-28 LAB — COMPREHENSIVE METABOLIC PANEL
ALT: 30 U/L (ref 0–55)
AST (SGOT): 22 U/L (ref 5–41)
Albumin/Globulin Ratio: 1.5 (ref 0.9–2.2)
Albumin: 4.1 g/dL (ref 3.5–5.0)
Alkaline Phosphatase: 76 U/L (ref 37–117)
Anion Gap: 9 (ref 5.0–15.0)
BUN: 12 mg/dL (ref 9.0–28.0)
Bilirubin, Total: 0.5 mg/dL (ref 0.2–1.2)
CO2: 27 mEq/L (ref 17–29)
Calcium: 9.5 mg/dL (ref 8.5–10.5)
Chloride: 105 mEq/L (ref 99–111)
Creatinine: 0.9 mg/dL (ref 0.5–1.5)
Globulin: 2.7 g/dL (ref 2.0–3.6)
Glucose: 87 mg/dL (ref 70–100)
Potassium: 4.2 mEq/L (ref 3.5–5.3)
Protein, Total: 6.8 g/dL (ref 6.0–8.3)
Sodium: 141 mEq/L (ref 135–145)
eGFR: >60 mL/min/{1.73_m2} (ref >=60)

## 2022-08-28 LAB — LIPID PANEL
Cholesterol / HDL Ratio: 3.9 Index
Cholesterol: 136 mg/dL (ref 0–199)
HDL: 35 mg/dL — ABNORMAL LOW (ref 40–9999)
LDL Calculated: 71 mg/dL (ref 0–99)
Triglycerides: 152 mg/dL — ABNORMAL HIGH (ref 34–149)
VLDL Calculated: 30 mg/dL (ref 10–40)

## 2022-08-28 LAB — HEMOLYSIS INDEX: Hemolysis Index: 17 Index (ref 0–24)

## 2022-08-28 MED ORDER — ROSUVASTATIN CALCIUM 5 MG PO TABS
5.0000 mg | ORAL_TABLET | Freq: Every day | ORAL | 3 refills | Status: DC
Start: 2022-08-28 — End: 2023-08-31

## 2022-08-28 NOTE — Progress Notes (Signed)
[facility] [place] [facility]                       Date of Exam: 08/28/2022 8:32 AM        Patient ID: Keith Hebert is a 66 y.o. male.  Attending Physician: Sherie Don, MD        Reason for visit:    Patient is here today for a Medicare Wellness visit.     Medicare AWV Note:    LEGAL DOCUMENTS and CODE STATUS:   Advance Directive Received: No     Type of Document Received:  Document has not yet been received due to patient will bring documentation         Code Status: yet to be determined    Concerns Outside of the Medicare Wellness     HPI:      Issues outside of the Medicare Wellness will be addressed in a separate note.         Health Risk Assessment:     During the past month, how would you rate your general health?:  (P) Very Good  Which of the following tasks can you do without assistance - drive or take the bus alone; shop for groceries or clothes; prepare your own meals; do your own housework/laundry; handle your own finances/pay bills; eat, bathe or get around your home?:  (P) Drive or take the bus alone, Shop for groceries or clothes, Prepare your own meals, Do your own housework/laundry, Handle your own finances/pay bills, Eat, bathe, dress or get around your home  Which of the following problems have you been bothered by in the past month - dizzy when standing up; problems using the phone; feeling tired or fatigued; moderate or severe body pain?: (P) None of these  Do you exercise for about 20 minutes 3 or more days per week?:  (P) Yes  During the past month was someone available to help if you needed and wanted help?  For example, if you felt nervous, lonely, got sick and had to stay in bed, needed someone to talk to, needed help with daily chores or needed help just taking care of yourself.: (P) Yes  Do you always wear a seat belt?: (P) Yes  Do you have any trouble taking medications the way you have been told to take them?: (P) No  Have you been given any  information that can help you with keeping track of your medications?: (P) Yes  Do you have trouble paying for your medications?: (P) No  Have you been given any information that can help you with hazards in your house, such as scatter rugs, furniture, etc?: (P) No  Do you feel unsteady when standing or walking?: (P) No  Do you worry about falling?: (P) No  Have you fallen two or more times in the past year?: (P) No  Did you suffer any injuries from your falls in the past year?: (P) No      Hospitalizations:   no hospitalizations within past year    Depression Screening:   Performed and documented in screening tab.        Functional Assessment:   Falls Risk:  home does not have throw rugs, poor lighting or a slippery bath tub or shower  Hearing:  hearing within normal limits  Exercise:  Walking and swimming   ADL's:   Bathing - independent   Dressing - independent   Mobility -  independent   Transfer - independent   Eating - independent}   Toileting - independent   ADL assistance not needed and provided by spouse      Cognitive Function:     Mood/affect: Appropriate  Appearance: neatly groomed, appropriately and adequately nourished  Family member/caregiver input: Not present        AWV Mini-Cog Result:  Recalled 2 out of 3 words. Completed clock drawing             Care Team:    Patient Care Team:  Sherie Don, MD as PCP - General (Family Medicine)  Patsey Berthold, MD as Consulting Physician (Cardiology)          Problem List:    Patient Active Problem List   Diagnosis    Vitamin D deficiency    High triglycerides    Low serum HDL    Elevated LDL cholesterol level    Mixed hyperlipidemia             Current Meds:    Outpatient Medications Marked as Taking for the 08/28/22 encounter (Office Visit) with Sherie Don, MD   Medication Sig Dispense Refill    rosuvastatin (CRESTOR) 5 MG tablet Take 1 tablet (5 mg) by mouth daily 90 tablet 3          Allergies:    No Known Allergies          Past Surgical History/Past  Medical History:    Past Surgical History:   Procedure Laterality Date    COLONOSCOPY  2010    age 88; 1 polyp repeat 5 years; has not scheduled f/u yet    HAND SURGERY  1992    L thumb    KNEE SURGERY  1983    L knee    ORTHOPEDIC SURGERY  1980    LEFT--torn ligaments       History reviewed. No pertinent past medical history.        Family History:    Family History   Problem Relation Age of Onset    Alzheimer's disease Mother     Lung cancer Father     Lung disease Father     Diabetes Sister     Heart disease Sister            Social History:    Social History     Tobacco Use    Smoking status: Never    Smokeless tobacco: Never   Vaping Use    Vaping Use: Never used   Substance Use Topics    Alcohol use: Yes     Alcohol/week: 2.0 standard drinks of alcohol     Types: 1 Shots of liquor, 1 Glasses of wine per week     Comment: 1 drink monthly, 1 drink of wine Q6 months    Drug use: Never           The following sections were reviewed this encounter by the provider:               Vital Signs:      BP 128/81 (BP Site: Right arm, Patient Position: Sitting, Cuff Size: Medium)   Pulse 62   Temp 97.7 F (36.5 C) (Temporal)   Ht 1.778 m (5\' 10" )   Wt 90.7 kg (200 lb)   BMI 28.70 kg/m          Physical Exam:    General: Patient is non-ill appearing and alert.  Assessment:    Medicare Wellness Visit             Plan:    As part of your wellness benefit, Medicare makes many screening tests available to you at no charge.  A complete list of these tests can be found at their website, InsuranceSquad.es. However, many of these tests or recommendations are out of date, or may not apply to you. After careful consideration of your own personal health needs, the following testing is recommended for you:      Preventive Service    Up-to-date (UTD)/Due/Not Applicable (N/A)   Last Done   Medicare Frequency   Body Mass Index   Up-to-date August 28, 2022  (BMI):Body mass index is 28.7 kg/m.   Height:Height: 177.8 cm (5\' 10" )  Weight:Weight: 90.7 kg (200 lb) Annually   Blood Pressure: Up-to-date August 28, 2022    BP: 128/81   Every 2 yrs, if BP </= 120/80 mm hg  Annually, if BP >120-139/80-89 mm hg   Abdominal Aortic Aneurysm Screening Not medically indicated  Once, between the age range of 62-75 and smoked 100+ cigarettes in lifetime   Cholesterol Testing Due Lab Results   Component Value Date    LDL 79 08/27/2021     Regularly beginning at age 26 with risk factors   Diabetes Screening Due  Lab Results   Component Value Date    GLU 80 08/27/2021      If prediabetes, one screening every 6 months  Otherwise, one screening every 12 months with certain risk factors for diabetes   Colorectal Cancer Screening Due  Annually, Fecal Occult Blood Stool (FOBS)  Every 5 yrs, Sigmoidoscopy with FOBS  Every 10 yrs, Colonoscopy  Every 3 yrs, Cologuard   Prostate Cancer Screening Due  Lab Results   Component Value Date    PSA 1.2 08/08/2020    Frequency: annually for covered Medicare beneficiaries   Depression Screening Up-to-date August 28, 2022  As necessary for those with risk factors   Sexually Transmitted Diseases (STDs) & HIV Screening Not applicable  As necessary for those with risk factors   Alcohol Misuse Screening Up-to-date  As necessary for those with risk factors   Immunizations:   Up-to-date Immunization History   Administered Date(s) Administered    COVID-19 mRNA 2023-2024 vaccine 12 years and above [employer]) 30 mcg/0.3 mL 07/29/2022    COVID-19 mRNA BIVALENT vaccine 12 years and above [employer]) 30 mcg/0.3 mL 07/25/2021, 02/17/2022    COVID-19 mRNA MONOVALENT vaccine PRIMARY SERIES 12 years and above [employer]) 30 mcg/0.3 mL (DILUTE BEFORE USE) 11/27/2019, 12/21/2019, 07/20/2020    COVID-19 mRNA MONOVALENT vaccine PRIMARY SERIES 12 years and above [employer]) 30 mcg/0.3 mL (DO NOT DILUTE) 02/01/2021    INFLUENZA HIGH DOSE 65 YRS+ 08/20/2022    INFLUENZA HIGH DOSE  65 YRS+ Quad 0.7 mL 07/12/2021, 08/20/2022    Influenza (Im) Preservative Free 08/05/2006, 08/07/2013, 08/06/2016    Influenza vaccine, QUADRIVALENT, 6 months and older (AFLURIA/FLUARIX/FLULAVAL/FLUZONE), single-dose preservative free, 0.5 mL 08/08/2020    Influenza vaccine, quadrivalent, 6 months and older (Afluria/Fluzone), multi-dose, 5 mL 07/27/2015, 08/04/2018, 07/19/2019    PPD Test 02/13/2005, 01/25/2014    Pneumococcal 23 valent 07/19/2019    Pneumococcal Conjugate 20-Valent 08/27/2021    RSV vaccine (ABRYSVO), bivalent, RSVpreF A&B, diluent reconstituted, PF, 0.5 mL 08/12/2022, 08/12/2022    TD ADULT, NOT ABSORBED 07/30/2006    Prevnar 13: 1 dose after age 38  Pneumovax 23: 1 dose 1 year after Prevnar  Influenza: Annually   Advance Directive Due  Once; update as needed   Medical Nutrition Therapy Up-to-date  As necessary for diabetes or renal disease   Smoking Cessation Counseling Up-to-date Counseling given: Not Answered   Frequency: two cessation attempts per year.   Glaucoma Screening Up-to-date  Annually for covered high risk Medicare beneficiaries (one of the following: DM, FHx Glaucoma, African-Americans aged 80+, Hispanic-Americans aged 65+)   Hepatitis C Virus (HCV) Screening Up-to-date  Annually only for high risk behavior  Once if born between [birth year] and 1967 and are not considered high risk   Lung Cancer Screening Not applicable  Annually if asymptomatic, tobacco smoking history of at least 30 pack-years (one pack-year = smoking one pack per day for one year; 1 pack = 20 cigarettes), and current smoker or one who has quit smoking within the last 15 years     Your major risk factors:       HLD     Recommendations for improvement:    Low cholesterol diet, Exercise, and Weight management     Referrals:    See After Visit Summary orders         No orders of the defined types were placed in this encounter.               Follow-up:    No follow-ups on file.         Sherie Don, MD

## 2022-08-28 NOTE — Progress Notes (Signed)
[facility] [place] [facility]                       Date of Exam: 08/28/2022 8:56 AM        Patient ID: Keith Hebert is a 66 y.o. male.  Attending Physician: Sherie Don, MD        Chief Complaint:    Chief Complaint   Patient presents with    Medicare Annual Wellness Visit     FBW/Coloonoscopy referral                HPI:    66 year old male comes in for annual physical and chronic care follow up.  He reports that he has been in overall good general health.      He has been taking the Crestor without problems.  He has been really working on his diet and exercise as well.     He does not smoke and has never smoked.    He has 1-2 drinks per week.    He is married.    He does maintain a healthy well balanced diet and does stay active with walking regularly.  He is walking 3 miles in the morning and 2 miles in the afternoon.     He will be getting colonoscopy done with Dr. Josephine Igo.     He is up to date with Prevnar 20 and Pneumovax vaccines.    He got his seasonal flu shot.     He will get his Shingrix and Tdap at his local pharmacy since he is on Medicare.                Problem List:    Patient Active Problem List   Diagnosis    Vitamin D deficiency    High triglycerides    Low serum HDL    Elevated LDL cholesterol level    Mixed hyperlipidemia             Current Meds:    Outpatient Medications Marked as Taking for the 08/28/22 encounter (Office Visit) with Sherie Don, MD   Medication Sig Dispense Refill    [DISCONTINUED] rosuvastatin (CRESTOR) 5 MG tablet Take 1 tablet (5 mg) by mouth daily 90 tablet 3          Allergies:    No Known Allergies          Past Surgical History:    Past Surgical History:   Procedure Laterality Date    COLONOSCOPY  2010    age 68; 1 polyp repeat 5 years; has not scheduled f/u yet    HAND SURGERY  1992    L thumb    KNEE SURGERY  1983    L knee    ORTHOPEDIC SURGERY  1980    LEFT--torn ligaments           Family History:    Family History    Problem Relation Age of Onset    Alzheimer's disease Mother     Lung cancer Father     Lung disease Father     Diabetes Sister     Heart disease Sister            Social History:    Social History     Tobacco Use    Smoking status: Never    Smokeless tobacco: Never   Vaping Use    Vaping Use: Never used   Substance  Use Topics    Alcohol use: Yes     Alcohol/week: 2.0 standard drinks of alcohol     Types: 1 Shots of liquor, 1 Glasses of wine per week     Comment: 1 drink monthly, 1 drink of wine Q6 months    Drug use: Never           The following sections were reviewed this encounter by the provider:   Tobacco  Allergies  Meds  Problems  Med Hx  Surg Hx  Fam Hx             Vital Signs:    BP 110/70 (BP Site: Right arm, Patient Position: Sitting, Cuff Size: Medium)   Pulse 62   Temp 97.7 F (36.5 C) (Temporal)   Ht 1.778 m (5\' 10" )   Wt 90.7 kg (200 lb)   BMI 28.70 kg/m          ROS:    As per HPI          Physical Exam:    Physical Exam  Constitutional:       Appearance: Normal appearance.   Eyes:      General: No scleral icterus.     Conjunctiva/sclera: Conjunctivae normal.   Neck:      Vascular: No carotid bruit.   Cardiovascular:      Rate and Rhythm: Normal rate and regular rhythm.      Pulses: Normal pulses.      Heart sounds: Normal heart sounds. No murmur heard.     No gallop.   Pulmonary:      Effort: Pulmonary effort is normal. No respiratory distress.      Breath sounds: Normal breath sounds. No wheezing, rhonchi or rales.   Musculoskeletal:      Cervical back: Normal range of motion and neck supple. No muscular tenderness.   Lymphadenopathy:      Cervical: No cervical adenopathy.   Skin:     General: Skin is warm and dry.   Neurological:      General: No focal deficit present.      Mental Status: He is alert.   Psychiatric:         Mood and Affect: Mood normal.         Behavior: Behavior normal.              Assessment:    1. Encounter for Medicare annual wellness exam  - Comprehensive  metabolic panel    2. Mixed hyperlipidemia  - Comprehensive metabolic panel  - Lipid panel  - rosuvastatin (CRESTOR) 5 MG tablet; Take 1 tablet (5 mg) by mouth daily  Dispense: 90 tablet; Refill: 3    3. Prostate cancer screening  - PROSTATE SPECIFIC ANTIGEN SCREEN    4. Colon cancer screening  - Referral to Gastroenterology (EXTERNAL); Future            Plan:    AWV: See note.   HLD: Check fasting lipids.  Continue with efforts with healthy low fat/low cholesterol diet along with taking statin medication to keep lipids at goals.  PSA: Prostate cancer screening guidelines discussed and all questions answered.  Patient would like to proceed with PSA testing.  Colon cancer screening: Order/referral provided to patient.           Follow-up:    Return in about 1 year (around 08/29/2023) for Annual physical, Chronic care with fasting labs.Sherie Don, MD

## 2022-08-29 LAB — PROSTATE SPECIFIC ANTIGEN SCREEN: Prostate Specific Antigen Screen: 1.134 ng/mL (ref 0.000–4.000)

## 2023-08-31 ENCOUNTER — Encounter (INDEPENDENT_AMBULATORY_CARE_PROVIDER_SITE_OTHER): Payer: Self-pay | Admitting: Family Medicine

## 2023-08-31 ENCOUNTER — Ambulatory Visit (FREE_STANDING_LABORATORY_FACILITY): Payer: Medicare (Managed Care) | Admitting: Family Medicine

## 2023-08-31 ENCOUNTER — Other Ambulatory Visit (INDEPENDENT_AMBULATORY_CARE_PROVIDER_SITE_OTHER): Payer: Self-pay | Admitting: Family Medicine

## 2023-08-31 VITALS — BP 120/77 | HR 58 | Temp 97.9°F | Ht 70.0 in | Wt 207.0 lb

## 2023-08-31 DIAGNOSIS — Z0000 Encounter for general adult medical examination without abnormal findings: Secondary | ICD-10-CM

## 2023-08-31 DIAGNOSIS — E782 Mixed hyperlipidemia: Secondary | ICD-10-CM

## 2023-08-31 DIAGNOSIS — Z125 Encounter for screening for malignant neoplasm of prostate: Secondary | ICD-10-CM

## 2023-08-31 DIAGNOSIS — Z23 Encounter for immunization: Secondary | ICD-10-CM

## 2023-08-31 DIAGNOSIS — Z1211 Encounter for screening for malignant neoplasm of colon: Secondary | ICD-10-CM

## 2023-08-31 MED ORDER — ROSUVASTATIN CALCIUM 5 MG PO TABS
5.0000 mg | ORAL_TABLET | Freq: Every day | ORAL | 3 refills | Status: DC
Start: 2023-08-31 — End: 2024-08-30

## 2023-08-31 NOTE — Progress Notes (Signed)
 [facility] [place] [facility]                       Date of Exam: 08/31/2023 2:14 PM        Patient ID: Keith Hebert is a 67 y.o. male.  Attending Physician: Sherie Don, MD        Chief Complaint:    Chief Complaint   Pa

## 2023-08-31 NOTE — Progress Notes (Signed)
 [facility] [place] [facility]                       Date of Exam: 08/31/2023 1:51 PM        Patient ID: Keith Hebert is a 67 y.o. male.  Attending Physician: Sherie Don, MD        Reason for visit:    Patient is here tod

## 2023-08-31 NOTE — Telephone Encounter (Signed)
Requesting medication refill    Prescription: Rosuvastatin 5mg  tablet    Last Filled: 08/28/2022     Scheduled Appointment: 08/31/2023 for AWV    Pharmacy: [facility]

## 2023-09-01 LAB — COMPREHENSIVE METABOLIC PANEL
ALT: 45 U/L (ref 0–55)
AST (SGOT): 30 U/L (ref 5–41)
Albumin/Globulin Ratio: 1.6 (ref 0.9–2.2)
Albumin: 4.1 g/dL (ref 3.5–5.0)
Alkaline Phosphatase: 76 U/L (ref 37–117)
Anion Gap: 6 (ref 5.0–15.0)
BUN: 13 mg/dL (ref 9–28)
Bilirubin, Total: 0.4 mg/dL (ref 0.2–1.2)
CO2: 26 meq/L (ref 17–29)
Calcium: 9.4 mg/dL (ref 8.5–10.5)
Chloride: 106 meq/L (ref 99–111)
Creatinine: 0.9 mg/dL (ref 0.5–1.5)
GFR: >60 mL/min/{1.73_m2} (ref >=60.0)
Globulin: 2.6 g/dL (ref 2.0–3.6)
Glucose: 90 mg/dL (ref 70–100)
Hemolysis Index: 21 {index}
Potassium: 4.6 meq/L (ref 3.5–5.3)
Protein, Total: 6.7 g/dL (ref 6.0–8.3)
Sodium: 138 meq/L (ref 135–145)

## 2023-09-01 LAB — LIPID PANEL
Cholesterol / HDL Ratio: 3.7 {index}
Cholesterol: 129 mg/dL (ref <=199)
HDL: 35 mg/dL — ABNORMAL LOW (ref >=40)
LDL Calculated: 70 mg/dL (ref 0–99)
Triglycerides: 118 mg/dL (ref 34–149)
VLDL Calculated: 24 mg/dL (ref 10–40)

## 2023-09-01 LAB — PSA TOTAL, ANNUAL SCREENING: Prostate Specific Antigen, Total: 1.3 ng/mL (ref 0.000–4.000)

## 2023-09-03 ENCOUNTER — Encounter (INDEPENDENT_AMBULATORY_CARE_PROVIDER_SITE_OTHER): Payer: Self-pay | Admitting: Family Medicine

## 2024-03-10 ENCOUNTER — Other Ambulatory Visit

## 2024-03-10 DIAGNOSIS — N170 Acute kidney failure with tubular necrosis: Secondary | ICD-10-CM

## 2024-03-10 DIAGNOSIS — N051 Unspecified nephritic syndrome with focal and segmental glomerular lesions: Secondary | ICD-10-CM

## 2024-03-10 DIAGNOSIS — N058 Unspecified nephritic syndrome with other morphologic changes: Secondary | ICD-10-CM

## 2024-03-17 LAB — EXTERNAL PATHOLOGY REVIEW

## 2024-07-20 ENCOUNTER — Other Ambulatory Visit (INDEPENDENT_AMBULATORY_CARE_PROVIDER_SITE_OTHER): Payer: Self-pay | Admitting: Family Medicine

## 2024-08-23 ENCOUNTER — Encounter (INDEPENDENT_AMBULATORY_CARE_PROVIDER_SITE_OTHER): Payer: Self-pay

## 2024-08-23 ENCOUNTER — Encounter (INDEPENDENT_AMBULATORY_CARE_PROVIDER_SITE_OTHER): Payer: Self-pay | Admitting: Family Medicine

## 2024-08-24 ENCOUNTER — Ambulatory Visit

## 2024-08-24 ENCOUNTER — Other Ambulatory Visit: Payer: Self-pay

## 2024-08-24 ENCOUNTER — Ambulatory Visit (HOSPITAL_COMMUNITY)

## 2024-08-30 ENCOUNTER — Ambulatory Visit (INDEPENDENT_AMBULATORY_CARE_PROVIDER_SITE_OTHER): Admitting: Family Medicine

## 2024-08-30 ENCOUNTER — Encounter (INDEPENDENT_AMBULATORY_CARE_PROVIDER_SITE_OTHER): Payer: Self-pay | Admitting: Family Medicine

## 2024-08-30 VITALS — BP 122/80 | HR 66 | Temp 97.5°F | Ht 70.0 in | Wt 209.0 lb

## 2024-08-30 DIAGNOSIS — E782 Mixed hyperlipidemia: Secondary | ICD-10-CM

## 2024-08-30 DIAGNOSIS — Z0184 Encounter for antibody response examination: Secondary | ICD-10-CM

## 2024-08-30 DIAGNOSIS — Z1211 Encounter for screening for malignant neoplasm of colon: Secondary | ICD-10-CM

## 2024-08-30 DIAGNOSIS — Z0000 Encounter for general adult medical examination without abnormal findings: Secondary | ICD-10-CM

## 2024-08-30 MED ORDER — ROSUVASTATIN CALCIUM 5 MG PO TABS
5.0000 mg | ORAL_TABLET | Freq: Every day | ORAL | 3 refills | Status: AC
Start: 2024-08-30 — End: ?

## 2024-08-30 NOTE — Progress Notes (Signed)
 Date of Exam: 08/30/2024 2:44 PM        Patient ID: Keith Hebert is a 68 y.o. male.  Attending Physician: Alm LELON Ruth, MD        Chief Complaint:    Chief Complaint   Patient presents with    Medicare Annual Wellness Visit     Fasting: Yes  Last colonoscopy: 2010   No Occult on file  Exercise: Walking every other day  Last eye exam: 04/2024  Advance Directive: Due  Shingrix, Tdap, Prevnar 20, flu UTD             HPI:    68 year old male comes in for annual physical and chronic care follow up.  Keith Hebert reports that Keith Hebert has been in overall good general health.      Keith Hebert has been taking the Crestor  without problems.  Keith Hebert has been really working on his diet and exercise as well.     Keith Hebert does not smoke and has never smoked.    Keith Hebert has 1-2 drinks per week.    Keith Hebert is married.      Keith Hebert does maintain a healthy well balanced diet.  Keith Hebert is back to exercising and doing some weight training and using body weight exercises.  Keith Hebert is stretching as well.      Keith Hebert needs a recommendation for gastroenterologist for screening colonoscopy.      Keith Hebert is up to date with Prevnar 20 and Pneumovax vaccines.    Keith Hebert is up to date with his Tdap, RSV, and Shingrix vaccines.     Keith Hebert is up to date with his Flu shot.               Problem List:    Problem List[1]          Current Meds:    Medications Taking[2]       Allergies:    Allergies[3]          Past Surgical History:    Past Surgical History[4]        Family History:    Family History[5]        Social History:    Social History[6]        The following sections were reviewed this encounter by the provider:   Tobacco  Allergies  Meds  Problems  Med Hx  Surg Hx  Fam Hx             Vital Signs:    BP 122/80 (BP Site: Right arm, Patient Position: Sitting, Cuff Size: Medium)   Pulse 66   Temp 97.5 F (36.4 C) (Tympanic)   Ht 1.778 m (5' 10)   Wt 94.8 kg (209 lb)   BMI 29.99 kg/m          ROS:    As per HPI          Physical Exam:    Physical Exam  Constitutional:        Appearance: Normal appearance.   Eyes:      General: No scleral icterus.     Conjunctiva/sclera: Conjunctivae normal.   Neck:      Vascular: No carotid bruit.   Cardiovascular:      Rate and Rhythm: Normal rate and regular rhythm.      Pulses: Normal pulses.      Heart sounds: Normal heart sounds. No murmur heard.     No gallop.   Pulmonary:      Effort: Pulmonary effort  is normal. No respiratory distress.      Breath sounds: Normal breath sounds. No wheezing, rhonchi or rales.   Musculoskeletal:      Cervical back: Normal range of motion and neck supple. No muscular tenderness.   Lymphadenopathy:      Cervical: No cervical adenopathy.   Skin:     General: Skin is warm and dry.   Neurological:      General: No focal deficit present.      Mental Status: Keith Hebert is alert.   Psychiatric:         Mood and Affect: Mood normal.         Behavior: Behavior normal.              Assessment:    1. Encounter for Medicare annual wellness exam  - Comprehensive Metabolic Panel  - Lipid Panel    2. Mixed hyperlipidemia  - Comprehensive Metabolic Panel  - Lipid Panel  - rosuvastatin  (CRESTOR ) 5 MG tablet; Take 1 tablet (5 mg) by mouth once daily  Dispense: 100 tablet; Refill: 3    3. Colon cancer screening  - Referral to Gastroenterology (EXTERNAL); Future    4. Immunity status testing  - Rubeola Antibody, IgG  - Rubella Antibody, IgG  - Mumps Antibody, IgG          Plan:    AWV: Healthy well adult exam. Discussed healthy lifestyle with diet and exercise to maintain a healthy weight. Recommend 150 minutes of aerobic/cardiac exercise weekly along with getting 5-9 servings of fruits and vegetables per day. Recommend 60 ounces of water daily for optimal hydration.  Discussed age appropriate screening and vaccines. All questions answered. See additional note.   HLD: Check fasting lipids.  Continue with efforts with healthy low fat/low cholesterol diet along with taking statin medication to keep lipids at goals.  Colon cancer screening:  Order/referral provided to patient.   Check titers for MMR.           Follow-up:    Return in about 1 year (around 08/30/2025) for AWV, Chronic care with fasting labs.SABRA Alm LELON Jama, MD             [1]   Patient Active Problem List  Diagnosis    Vitamin D  deficiency    High triglycerides    Low serum HDL    Elevated LDL cholesterol level    Mixed hyperlipidemia   [2]   Outpatient Medications Marked as Taking for the 08/30/24 encounter (Office Visit) with Jama Alm LELON, MD   Medication Sig Dispense Refill    [DISCONTINUED] rosuvastatin  (CRESTOR ) 5 MG tablet Take 1 tablet (5 mg) by mouth daily 100 tablet 3   [3] No Known Allergies  [4]   Past Surgical History:  Procedure Laterality Date    COLONOSCOPY, DIAGNOSTIC (SCREENING)  2010    age 45; 1 polyp repeat 5 years; has not scheduled f/u yet    HAND SURGERY  1992    L thumb    KNEE SURGERY  1983    L knee    ORTHOPEDIC SURGERY  1980    LEFT--torn ligaments   [5]   Family History  Problem Relation Name Age of Onset    Alzheimer's disease Mother      Lung cancer Father      Lung disease Father      Diabetes Sister      Heart disease Sister     [  6]   Social History  Tobacco Use    Smoking status: Never    Smokeless tobacco: Never   Vaping Use    Vaping status: Never Used   Substance Use Topics    Alcohol use: Yes     Alcohol/week: 0.0 - 1.0 standard drinks of alcohol     Comment: 1 drink monthly, 1 drink of wine Q6 months    Drug use: Never

## 2024-08-30 NOTE — Progress Notes (Signed)
 [facility]                       Date of Exam: 08/30/2024 2:26 PM        Patient ID: Keith Hebert is a 68 y.o. male.  Attending Physician: Alm LELON Ruth, MD        Reason for visit:    Patient is here today for a Medicare Wellness visit.     Medicare AWV Note:    LEGAL DOCUMENTS and CODE STATUS:   Advance Directive Received: No     Type of Document Received:  Document has not yet been received due to will fax over document       Code Status: yet to be determined    Concerns Outside of the Medicare Wellness     HPI:      Issues outside of the Medicare Wellness will be addressed in a separate note.         Health Risk Assessment:     During the past month, how would you rate your general health?:  (Patient-Rptd) (P) Good  Which of the following tasks can you do without assistance - drive or take the bus alone; shop for groceries or clothes; prepare your own meals; do your own housework/laundry; handle your own finances/pay bills; eat, bathe or get around your home?:  (Patient-Rptd) (P) Drive or take the bus alone, Shop for groceries or clothes, Prepare your own meals, Do your own housework/laundry, Handle your own finances/pay bills, Eat, bathe, dress or get around your home  Which of the following problems have you been bothered by in the past month - dizzy when standing up; problems using the phone; feeling tired or fatigued; moderate or severe body pain?: (Patient-Rptd) (P) None of these  Do you exercise for about 20 minutes 3 or more days per week?:  (Patient-Rptd) (P) Yes  During the past month was someone available to help if you needed and wanted help?  For example, if you felt nervous, lonely, got sick and had to stay in bed, needed someone to talk to, needed help with daily chores or needed help just taking care of yourself.: (Patient-Rptd) (P) Yes  Do you always wear a seat belt?: (Patient-Rptd) (P) Yes  Do you have any trouble taking medications  the way you have been told to take them?: (Patient-Rptd) (P) No  Have you been given any information that can help you with keeping track of your medications?: (Patient-Rptd) (P) Yes  Do you have trouble paying for your medications?: (Patient-Rptd) (P) No  Have you been given any information that can help you with hazards in your house, such as scatter rugs, furniture, etc?: (Patient-Rptd) (P) No  Do you feel unsteady when standing or walking?: (Patient-Rptd) (P) No  Do you worry about falling?: (Patient-Rptd) (P) No  Have you fallen two or more times in the past year?: (Patient-Rptd) (P) No  Did you suffer any injuries from your falls in the past year?: (Patient-Rptd) (P) No      Hospitalizations:   no hospitalizations within past year    Depression Screening:   Performed and documented in screening tab.        Functional Assessment:   Falls Risk:  home does not have throw rugs, poor lighting or a slippery bath tub or shower  Hearing:  patient reports hearing within normal limits  Exercise:  Moderate ( i.e. brisk walking )  ADL's:   Bathing - independent   Dressing - independent   Mobility - independent   Transfer - independent   Eating - independent}   Toileting - independent   ADL assistance not needed      Cognitive Function:     Mood/affect: Appropriate  Appearance: neatly groomed, appropriately and adequately nourished  Family member/caregiver input: Not present        AWV Mini-Cog Result:  > 3 points - negative screen for dementia            Care Team:    Patient Care Team:  Jama Alm ORN, MD as PCP - General (Family Medicine)  Janell Hezzie BIRCH, MD as Consulting Physician (Cardiology)          Problem List:    Problem List[1]          Current Meds:    Medications Taking[2]       Allergies:    Allergies[3]          Past Surgical History/Past Medical History:    Past Surgical History[4]    Medical History[5]        Family History:    Family History[6]        Social History:    Social History[7]        The  following sections were reviewed this encounter by the provider:               Vital Signs:      BP 122/80 (BP Site: Right arm, Patient Position: Sitting, Cuff Size: Medium)   Pulse 66   Temp 97.5 F (36.4 C) (Tympanic)   Ht 1.778 m (5' 10)   Wt 94.8 kg (209 lb)   BMI 29.99 kg/m          Physical Exam:    General: Patient is non-ill appearing and alert.          Assessment:    Medicare Wellness Visit             Plan:    As part of your wellness benefit, Medicare makes many screening tests available to you at no charge.  A complete list of these tests can be found at their website, [email or web address]. However, many of these tests or recommendations are out of date, or may not apply to you. After careful consideration of your own personal health needs, the following testing is recommended for you:      Preventive Service    Up-to-date (UTD)/Due/Not Applicable (N/A)   Last Done   Medicare Frequency   Body Mass Index   Up-to-date August 30, 2024  (BMI):Body mass index is 29.99 kg/m.   Height:Height: 177.8 cm (5' 10)  Weight:Weight: 94.8 kg (209 lb) Annually   Blood Pressure: Up-to-date August 30, 2024    BP: 122/80   Every 2 yrs, if BP </= 120/80 mm hg  Annually, if BP >120-139/80-89 mm hg   Abdominal Aortic Aneurysm Screening Not applicable  Once, between the age range of 61-75 and smoked 100+ cigarettes in lifetime   Cholesterol Testing Due Lab Results   Component Value Date    LDL 70 08/31/2023     Regularly beginning at age 47 with risk factors   Diabetes Screening Due Lab Results   Component Value Date    GLU 90 08/31/2023      If prediabetes, one screening every 6 months  Otherwise, one screening every 12 months with certain risk factors for diabetes   Colorectal  Cancer Screening Up-to-date  Annually, Fecal Occult Blood Stool (FOBS)  Every 5 yrs, Sigmoidoscopy with FOBS  Every 10 yrs, Colonoscopy  Every 3 yrs, Cologuard   Prostate Cancer  Screening Up-to-date Lab Results   Component Value Date    PSA 1.300 08/31/2023    Frequency: annually for covered Medicare beneficiaries   Depression Screening Up-to-date August 30, 2024  As necessary for those with risk factors   Sexually Transmitted Diseases (STDs) & HIV Screening Not applicable  As necessary for those with risk factors   Alcohol Misuse Screening Up-to-date  As necessary for those with risk factors   Immunizations:   Up-to-date Immunization History   Administered Date(s) Administered    COVID-19 mRNA BIVALENT vaccine 12 years and above [employer]) 30 mcg/0.3 mL 07/25/2021, 02/17/2022    COVID-19 mRNA MONOVALENT vaccine PRIMARY SERIES 12 years and above [employer]) 30 mcg/0.3 mL (DILUTE BEFORE USE) 11/27/2019, 12/21/2019, 07/20/2020    COVID-19 mRNA MONOVALENT vaccine PRIMARY SERIES 12 years and above [employer]) 30 mcg/0.3 mL (DO NOT DILUTE) 02/01/2021    COVID-19 mRNA vaccine 12 years and above (PFIZER/COMIRNATY) 30 mcg/0.3 mL 07/29/2022, 02/09/2023, 07/24/2023, 07/19/2024    Influenza (Flu) vaccine 08/05/2006, 08/07/2013, 08/06/2016    Influenza quadrivalent (AFLURIA/FLUARIX /FLULAVAL /FLUZONE ), 6 months and older, 0.5 mL, preservative free 08/08/2020    Influenza quadrivalent (AFLURIA/FLUZONE ), 6 months and older, multi-dose, 5 mL 07/27/2015, 08/04/2018, 07/19/2019    Influenza quadrivalent high-dose (FLUZONE  HIGH-DOSE) 65 years and older, 0.7 mL, preservative free 07/12/2021, 08/20/2022    Influenza trivalent 2025-2026 adjuvanted, 65 yrs+ (FLUAD) 0.5 mL 08/10/2024    Influenza trivalent 2025-2026 inactivated high-dose vaccine 65 yrs+ (FLUZONE  HIGH DOSE) 0.5 mL 08/31/2023    PPD Test 02/13/2005, 01/25/2014    Pneumococcal conjugate (PREVNAR 20) 20-valent, preservative free 08/27/2021    Pneumococcal polysaccharide (PNEUMOVAX 23), 23-valent 07/19/2019    RSV vaccine (ABRYSVO), bivalent, RSVpreF A&B, diluent reconstituted, PF, 0.5 mL 08/12/2022    Tdap (tetanus, diphtheria reduced, acellular pertussis)  (ADACEL/BOOSTRIX), adsorbed 08/06/2023    Tetanus and diphtheria toxoids, not adsorbed, for adult use 07/30/2006    Zoster (SHINGRIX) vaccine, recombinant 09/26/2022, 12/18/2022    Prevnar 13: 1 dose after age 13  Pneumovax 23: 1 dose 1 year after Prevnar  Influenza: Annually   Advance Directive Due  Once; update as needed   Medical Nutrition Therapy Up-to-date  As necessary for diabetes or renal disease   Smoking Cessation Counseling Up-to-date Counseling given: Not Answered   Frequency: two cessation attempts per year.   Glaucoma Screening Up-to-date  Annually for covered high risk Medicare beneficiaries (one of the following: DM, FHx Glaucoma, African-Americans aged 45+, Hispanic-Americans aged 65+)   Hepatitis C Virus (HCV) Screening Up-to-date  Annually only for high risk behavior  Once if born between [birth year] and 1965 and are not considered high risk   Lung Cancer Screening Not applicable  Annually if asymptomatic, tobacco smoking history of at least 30 pack-years (one pack-year = smoking one pack per day for one year; 1 pack = 20 cigarettes), and current smoker or one who has quit smoking within the last 15 years     Your major risk factors:       HLD     Recommendations for improvement:    Low cholesterol diet, Exercise, and Weight management     Referrals:    See After Visit Summary orders         No orders of the defined types were placed in this encounter.  Follow-up:    No follow-ups on file.         Alm LELON Ruth, MD                     [1]   Patient Active Problem List  Diagnosis    Vitamin D  deficiency    High triglycerides    Low serum HDL    Elevated LDL cholesterol level    Mixed hyperlipidemia   [2]   Outpatient Medications Marked as Taking for the 08/30/24 encounter (Office Visit) with Ruth Alm LELON, MD   Medication Sig Dispense Refill    rosuvastatin  (CRESTOR ) 5 MG tablet Take 1 tablet (5 mg) by mouth daily 100 tablet 3   [3] No Known Allergies  [4]   Past Surgical History:  Procedure  Laterality Date    COLONOSCOPY, DIAGNOSTIC (SCREENING)  2010    age 52; 1 polyp repeat 5 years; has not scheduled f/u yet    HAND SURGERY  1992    L thumb    KNEE SURGERY  1983    L knee    ORTHOPEDIC SURGERY  1980    LEFT--torn ligaments   [5] No past medical history on file.  [6]   Family History  Problem Relation Name Age of Onset    Alzheimer's disease Mother      Lung cancer Father      Lung disease Father      Diabetes Sister      Heart disease Sister     [7]   Social History  Tobacco Use    Smoking status: Never    Smokeless tobacco: Never   Vaping Use    Vaping status: Never Used   Substance Use Topics    Alcohol use: Yes     Alcohol/week: 0.0 - 1.0 standard drinks of alcohol     Comment: 1 drink monthly, 1 drink of wine Q6 months    Drug use: Never

## 2024-08-31 ENCOUNTER — Ambulatory Visit (INDEPENDENT_AMBULATORY_CARE_PROVIDER_SITE_OTHER): Payer: Self-pay | Admitting: Family Medicine

## 2024-08-31 LAB — COMPREHENSIVE METABOLIC PANEL
ALT: 28 IU/L (ref 0–44)
AST (SGOT): 25 IU/L (ref 0–40)
Albumin: 4.5 g/dL (ref 3.9–4.9)
Alkaline Phosphatase: 81 IU/L (ref 47–123)
BUN / Creatinine Ratio: 13 (ref 10–24)
BUN: 12 mg/dL (ref 8–27)
Bilirubin, Total: 0.4 mg/dL (ref 0.0–1.2)
CO2: 24 mmol/L (ref 20–29)
Calcium: 9.6 mg/dL (ref 8.6–10.2)
Chloride: 103 mmol/L (ref 96–106)
Creatinine: 0.93 mg/dL (ref 0.76–1.27)
Globulin, Total: 2.3 g/dL (ref 1.5–4.5)
Glucose: 81 mg/dL (ref 70–99)
Potassium: 4.1 mmol/L (ref 3.5–5.2)
Protein, Total: 6.8 g/dL (ref 6.0–8.5)
Sodium: 141 mmol/L (ref 134–144)
eGFR: 88 mL/min/1.73 (ref >59)

## 2024-08-31 LAB — MUMPS ANTIBODY, IGG: Mumps, IgG: 74.2 [AU]/ml (ref >10.9)

## 2024-08-31 LAB — LIPID PANEL
Cholesterol / HDL Ratio: 4 ratio (ref 0.0–5.0)
Cholesterol: 133 mg/dL (ref 100–199)
HDL: 33 mg/dL — ABNORMAL LOW (ref >39)
LDL Chol Calculated (NIH): 75 mg/dL (ref 0–99)
Triglycerides: 138 mg/dL (ref 0–149)
VLDL Calculated: 25 mg/dL (ref 5–40)

## 2024-08-31 LAB — RUBELLA ANTIBODY, IGG: Rubella AB, IgG: <0.9 {index} — ABNORMAL LOW (ref >0.99)

## 2024-08-31 LAB — RUBEOLA ANTIBODY, IGG: Rubeola (Measles), IgG: >300 [AU]/ml (ref >16.4)

## 2024-10-03 ENCOUNTER — Encounter (INDEPENDENT_AMBULATORY_CARE_PROVIDER_SITE_OTHER): Payer: Self-pay

## 2024-10-05 ENCOUNTER — Encounter (HOSPITAL_BASED_OUTPATIENT_CLINIC_OR_DEPARTMENT_OTHER): Payer: Self-pay

## 2024-10-05 ENCOUNTER — Telehealth (HOSPITAL_BASED_OUTPATIENT_CLINIC_OR_DEPARTMENT_OTHER): Payer: Self-pay

## 2024-10-05 DIAGNOSIS — N186 End stage renal disease: Secondary | ICD-10-CM

## 2024-10-05 NOTE — Progress Notes (Signed)
 Financial Clearance: Transplant Specialist Consult Only    Date: 10/05/24   Patient's Name: Almalik Weissberg  Patient's DOB: Nov 18, 1955  Transplant organ: Kidney   Dialysis: Yes   Start date (1st chronic): n/a   Dialysis Mode: hemo   [facility] 43-month COB period: n/a to n/a   Is patient currently subject to [facility] 68-month COB period? NO, No [facility] coverage   Does [facility] pay for premiums? Unknown    Primary Insurance: [employer]   Effective date: 10/28/23    Secondary Insurance: No    Does plan include transplant benefits? Yes    [facility] Status: Yes  Source radio broadcast assistant, call ref#, etc): IWT    PCP referral required? No    Auth required for Transplant Specialist consult? No    Additional Information: cleared for consult      Dedra LITTIE Barrio, Financial Specialist   Transplant Financial Coordinator

## 2024-10-05 NOTE — Telephone Encounter (Signed)
Processed second Kidney Transplant referral.  Plan to obtain financial clearance and schedule patient for Neph Only visit and 6MWT.

## 2024-10-21 ENCOUNTER — Telehealth (HOSPITAL_BASED_OUTPATIENT_CLINIC_OR_DEPARTMENT_OTHER): Payer: Self-pay

## 2024-10-21 NOTE — Telephone Encounter (Signed)
 First Attempt made:LVM for patient to call back at [phone] to schedule RN zoom class

## 2024-10-21 NOTE — Telephone Encounter (Signed)
 Spoke with patient and scheduled the RN zoom education for 10/25/24.Provided phone [phone] to call after class to schedule the initial consult. Zoom link and instructions emailed to patient.

## 2024-10-25 ENCOUNTER — Encounter (HOSPITAL_BASED_OUTPATIENT_CLINIC_OR_DEPARTMENT_OTHER): Payer: Self-pay

## 2024-10-25 NOTE — Progress Notes (Signed)
 Today, I discussed kidney transplantation with patient in a group education session conducted via Zoom.     I discussed the transplant work-up process within the guidelines set forth by the [facility] 's Kidney Transplant Program.    I discussed telephone contacts including Transplant Coordinator office telephone numbers, fax numbers.     The transplant evaluation process was discussed in detail. The discussion included the purpose of the evaluation. What medical tests could be expected at the [facility] including a blood draw for the following serologic testing: HIV, Hepatitis A, B, C, Syphillis, and TB. What testing would need to be completed through their primary care physician's office.    I discussed what patients could anticipate as we evaluate their transplant candidacy. I also discussed that additional testing may be required at [facility] which would entail additional visits to [facility].    I discussed the [place] [facility] [place] 's policy of no tolerance use of illicit drugs and alcohol. Random screening for nicotine and cotinine was discussed; the patient consented to random screening today at this initial visit.     I discussed what happens when all testing is completed including the selection committee review process for all transplant candidates. A copy of the [facility] Selection Criteria for Kidney Transplantation was reviewed.    I emphasized and discussed the benefits from a living donor.    I discussed the various types of deceased donors including >85% KDPI donors, increased risk donors and DCD donors. Discussed average waiting times for a deceased donor by blood type at [facility].    I discussed the 'The Patient Acknowledgement for Kidney Transplant' with the patient. The discussion included purpose, process, testing, risks and benefits of Kidney transplant.     I discussed the UNOS Multiple Listing and Waiting Time Transfer with the patient.     I discussed the transplant organ offer and admission for  transplant, the basics of the transplant surgery and what to expect during their hospital stay.    Finally, I discussed the post transplant phase, including potential complications and frequency of post transplant clinic visits.     This information was provided by:     Manuelita Schooling, RN   Kidney/Pancreas Transplant Coordinator  [facility]

## 2024-10-28 NOTE — Telephone Encounter (Signed)
 Received voicemail from patient stating they completed RN Education zoom class and ready to move forward with scheduling. Primary contact number confirmed as [phone].     First attempt made, PC left voicemail for patient requesting they return call to schedule initial consultation, , and [facility] phone call visit.    PC will wait for call back and reach out again if pt does not respond. PC provided patient with direct contact number [phone].

## 2024-11-02 ENCOUNTER — Encounter (HOSPITAL_BASED_OUTPATIENT_CLINIC_OR_DEPARTMENT_OTHER): Payer: Self-pay

## 2024-11-02 NOTE — Telephone Encounter (Signed)
 Coordinator called pt to schedule initial consultation and [facility] phone call visit. Coordinator also discussed cancer screening requirements: colonoscopy, mammogram, pap smear and dental clearance.     Set to be seen:  Initial Consult Date: In Clinic, 11/22/24  PC/PCC Phone Follow-up Date: 12/21/24    Detailed itinerary sent to patient via mail.    Pt was informed that if they no show the initial consultation twice, it will result in automatic referral closure. Pt expressed verbal understanding.     [facility]  Kidney Transplant Referral     Patient Name: Gary Tran  Age: 69 year old    MyChart Status    Dale confirms they does  NOT have access to MyChart. @TXPMYCHARTBENEFITS @         Transplant History     Previous organ transplant: No  Currently listed at another Transplant Center: No  Active evaluation or referral at another Transplant Center: No  Previously declined for a transplant at another Transplant Center: No    Medical Care Team    Dialysis Center Name and Days of Week Confirmed and Updated in Snapshot tab?  Y/N YES    Social History    Number of cigarettes daily: None  Current Nicotine Products: No  Current use of illicit/recreational drugs: Does not use illegal drugs    Social Support    Do you have a caregiver? Yes  Do you have a potential living donor? Yes    Health Maintenance Reminders    Discussed the need to have all appropriate testing updated    Imaging Records Needed    Yes, Patient completed a CT of the ABD/Pelvis on ~ 08/24/24 date at [facility]. Records will be requested.

## 2024-11-03 ENCOUNTER — Encounter (HOSPITAL_BASED_OUTPATIENT_CLINIC_OR_DEPARTMENT_OTHER): Payer: Self-pay

## 2024-11-03 ENCOUNTER — Encounter (INDEPENDENT_AMBULATORY_CARE_PROVIDER_SITE_OTHER): Payer: Self-pay

## 2024-11-03 NOTE — Progress Notes (Signed)
 TRANSPLANT ANNUAL INSURANCE REVERIFICATION  (ABBREVIATED)    Date: 11/03/2024  Patient's Name: Gary Tran  Organ: Kidney    Phase: Specialist Visit     Primary Insurance: [employer] GOLD [employer]US MEDICARE HMO  Active?: No    Secondary Insurance: Yes   MULTIPLAN/MULTIPLAN GENERIC  Active?: No  Is coordination of benefits up-to date and consistent across all coverages?: Yes       Action taken: Notified [facility] Lead of Inadequate Coverage    Additional comments: n/a    Vena Mcardle, Student  Transplant Financial Coordinator

## 2024-11-04 ENCOUNTER — Encounter (HOSPITAL_BASED_OUTPATIENT_CLINIC_OR_DEPARTMENT_OTHER): Payer: Self-pay

## 2024-11-04 NOTE — Progress Notes (Signed)
 Requested images in ehealth. Will notify RN once uploaded.

## 2024-11-05 ENCOUNTER — Other Ambulatory Visit (INDEPENDENT_AMBULATORY_CARE_PROVIDER_SITE_OTHER): Payer: Self-pay | Admitting: Family Medicine

## 2024-11-05 DIAGNOSIS — E782 Mixed hyperlipidemia: Secondary | ICD-10-CM

## 2024-11-07 ENCOUNTER — Other Ambulatory Visit (HOSPITAL_BASED_OUTPATIENT_CLINIC_OR_DEPARTMENT_OTHER): Payer: Self-pay

## 2024-11-07 ENCOUNTER — Other Ambulatory Visit (HOSPITAL_COMMUNITY): Payer: Self-pay

## 2024-11-07 ENCOUNTER — Ambulatory Visit

## 2024-11-07 DIAGNOSIS — Z7689 Persons encountering health services in other specified circumstances: Secondary | ICD-10-CM

## 2024-11-07 NOTE — Telephone Encounter (Signed)
 Refill too soon

## 2024-11-22 ENCOUNTER — Ambulatory Visit

## 2024-11-23 ENCOUNTER — Encounter (HOSPITAL_BASED_OUTPATIENT_CLINIC_OR_DEPARTMENT_OTHER): Payer: Self-pay

## 2024-11-23 NOTE — Telephone Encounter (Signed)
 Coordinator called pt to schedule initial consultation and PC phone call visit. Coordinator also discussed cancer screening requirements: colonoscopy, dental clearance.     Set to be seen:  Initial Consult Date: In Clinic, 11/29/24  PC/PCC Phone Follow-up Date: 12/28/24    Detailed itinerary sent to patient via email and MyChart.    Pt was informed that if they no show the initial consultation twice, it will result in automatic referral closure. Pt expressed verbal understanding.

## 2024-11-29 ENCOUNTER — Ambulatory Visit: Admitting: Nurse Practitioner

## 2024-11-29 ENCOUNTER — Encounter (HOSPITAL_BASED_OUTPATIENT_CLINIC_OR_DEPARTMENT_OTHER): Payer: Self-pay | Admitting: Nurse Practitioner

## 2024-11-29 VITALS — BP 115/71 | HR 63 | Temp 98.1°F | Ht 69.92 in | Wt 158.5 lb

## 2024-11-29 DIAGNOSIS — N041 Nephrotic syndrome with focal and segmental glomerular lesions: Secondary | ICD-10-CM | POA: Insufficient documentation

## 2024-11-29 DIAGNOSIS — N186 End stage renal disease: Secondary | ICD-10-CM | POA: Insufficient documentation

## 2024-11-29 DIAGNOSIS — G629 Polyneuropathy, unspecified: Secondary | ICD-10-CM

## 2024-11-29 DIAGNOSIS — I10 Essential (primary) hypertension: Secondary | ICD-10-CM

## 2024-11-29 DIAGNOSIS — Z719 Counseling, unspecified: Secondary | ICD-10-CM

## 2024-11-29 DIAGNOSIS — Z01818 Encounter for other preprocedural examination: Secondary | ICD-10-CM

## 2024-12-21 ENCOUNTER — Ambulatory Visit

## 2024-12-28 ENCOUNTER — Ambulatory Visit
